# Patient Record
Sex: Male | Born: 1940 | Race: White | Hispanic: No | Marital: Married
Health system: Southern US, Community
[De-identification: ages and names within clinical notes are randomized; demographics above are authoritative.]

## PROBLEM LIST (undated history)

## (undated) DIAGNOSIS — I219 Acute myocardial infarction, unspecified: Secondary | ICD-10-CM

## (undated) DIAGNOSIS — I480 Paroxysmal atrial fibrillation: Secondary | ICD-10-CM

## (undated) DIAGNOSIS — T8859XA Other complications of anesthesia, initial encounter: Secondary | ICD-10-CM

## (undated) DIAGNOSIS — K219 Gastro-esophageal reflux disease without esophagitis: Secondary | ICD-10-CM

## (undated) DIAGNOSIS — K567 Ileus, unspecified: Secondary | ICD-10-CM

## (undated) DIAGNOSIS — I714 Abdominal aortic aneurysm, without rupture, unspecified: Secondary | ICD-10-CM

## (undated) DIAGNOSIS — D696 Thrombocytopenia, unspecified: Secondary | ICD-10-CM

## (undated) DIAGNOSIS — G709 Myoneural disorder, unspecified: Secondary | ICD-10-CM

## (undated) DIAGNOSIS — G4733 Obstructive sleep apnea (adult) (pediatric): Secondary | ICD-10-CM

## (undated) DIAGNOSIS — C801 Malignant (primary) neoplasm, unspecified: Secondary | ICD-10-CM

## (undated) DIAGNOSIS — G0490 Encephalitis and encephalomyelitis, unspecified: Secondary | ICD-10-CM

## (undated) DIAGNOSIS — I10 Essential (primary) hypertension: Secondary | ICD-10-CM

## (undated) DIAGNOSIS — K432 Incisional hernia without obstruction or gangrene: Secondary | ICD-10-CM

## (undated) DIAGNOSIS — J189 Pneumonia, unspecified organism: Secondary | ICD-10-CM

## (undated) DIAGNOSIS — I255 Ischemic cardiomyopathy: Secondary | ICD-10-CM

## (undated) DIAGNOSIS — E7800 Pure hypercholesterolemia, unspecified: Secondary | ICD-10-CM

## (undated) DIAGNOSIS — T4145XA Adverse effect of unspecified anesthetic, initial encounter: Secondary | ICD-10-CM

## (undated) DIAGNOSIS — R52 Pain, unspecified: Secondary | ICD-10-CM

## (undated) DIAGNOSIS — M1990 Unspecified osteoarthritis, unspecified site: Secondary | ICD-10-CM

## (undated) DIAGNOSIS — Z9989 Dependence on other enabling machines and devices: Secondary | ICD-10-CM

## (undated) DIAGNOSIS — E119 Type 2 diabetes mellitus without complications: Secondary | ICD-10-CM

## (undated) DIAGNOSIS — M21372 Foot drop, left foot: Secondary | ICD-10-CM

## (undated) DIAGNOSIS — M21371 Foot drop, right foot: Secondary | ICD-10-CM

## (undated) DIAGNOSIS — G629 Polyneuropathy, unspecified: Secondary | ICD-10-CM

## (undated) DIAGNOSIS — L853 Xerosis cutis: Secondary | ICD-10-CM

## (undated) DIAGNOSIS — I2510 Atherosclerotic heart disease of native coronary artery without angina pectoris: Secondary | ICD-10-CM

## (undated) DIAGNOSIS — U071 COVID-19: Secondary | ICD-10-CM

## (undated) DIAGNOSIS — I5042 Chronic combined systolic (congestive) and diastolic (congestive) heart failure: Secondary | ICD-10-CM

## (undated) DIAGNOSIS — M722 Plantar fascial fibromatosis: Secondary | ICD-10-CM

## (undated) DIAGNOSIS — F32A Depression, unspecified: Secondary | ICD-10-CM

## (undated) HISTORY — PX: CORONARY ANGIOPLASTY WITH STENT PLACEMENT: SHX49

## (undated) HISTORY — PX: COLONOSCOPY: SHX174

## (undated) HISTORY — PX: ABDOMINAL AORTIC ANEURYSM REPAIR: SUR1152

## (undated) HISTORY — PX: BACK SURGERY: SHX140

## (undated) HISTORY — PX: CARDIAC CATHETERIZATION: SHX172

## (undated) HISTORY — PX: LAPAROSCOPIC CHOLECYSTECTOMY: SUR755

---

## 1998-04-28 ENCOUNTER — Inpatient Hospital Stay (HOSPITAL_COMMUNITY): Admission: EM | Admit: 1998-04-28 | Discharge: 1998-05-04 | Payer: Self-pay | Admitting: Emergency Medicine

## 1998-05-23 ENCOUNTER — Encounter (HOSPITAL_COMMUNITY): Admission: RE | Admit: 1998-05-23 | Discharge: 1998-08-21 | Payer: Self-pay | Admitting: Interventional Cardiology

## 1998-08-07 ENCOUNTER — Encounter: Payer: Self-pay | Admitting: Interventional Cardiology

## 1998-08-07 ENCOUNTER — Inpatient Hospital Stay (HOSPITAL_COMMUNITY): Admission: EM | Admit: 1998-08-07 | Discharge: 1998-08-08 | Payer: Self-pay | Admitting: Interventional Cardiology

## 1998-08-21 ENCOUNTER — Encounter (HOSPITAL_COMMUNITY): Admission: RE | Admit: 1998-08-21 | Discharge: 1998-11-19 | Payer: Self-pay | Admitting: Interventional Cardiology

## 1999-08-19 ENCOUNTER — Ambulatory Visit: Admission: RE | Admit: 1999-08-19 | Discharge: 1999-08-19 | Payer: Self-pay | Admitting: Internal Medicine

## 1999-11-24 ENCOUNTER — Ambulatory Visit: Admission: RE | Admit: 1999-11-24 | Discharge: 1999-11-24 | Payer: Self-pay | Admitting: Internal Medicine

## 2000-11-03 ENCOUNTER — Encounter: Admission: RE | Admit: 2000-11-03 | Discharge: 2001-02-01 | Payer: Self-pay | Admitting: Internal Medicine

## 2001-03-07 ENCOUNTER — Encounter: Payer: Self-pay | Admitting: *Deleted

## 2001-03-07 ENCOUNTER — Ambulatory Visit (HOSPITAL_COMMUNITY): Admission: RE | Admit: 2001-03-07 | Discharge: 2001-03-07 | Payer: Self-pay | Admitting: *Deleted

## 2001-03-10 ENCOUNTER — Encounter: Admission: RE | Admit: 2001-03-10 | Discharge: 2001-03-10 | Payer: Self-pay | Admitting: *Deleted

## 2001-03-10 ENCOUNTER — Encounter: Payer: Self-pay | Admitting: *Deleted

## 2001-03-27 ENCOUNTER — Encounter: Admission: RE | Admit: 2001-03-27 | Discharge: 2001-03-27 | Payer: Self-pay | Admitting: *Deleted

## 2001-04-10 ENCOUNTER — Encounter: Payer: Self-pay | Admitting: *Deleted

## 2001-04-10 ENCOUNTER — Encounter: Admission: RE | Admit: 2001-04-10 | Discharge: 2001-04-10 | Payer: Self-pay | Admitting: *Deleted

## 2001-06-10 ENCOUNTER — Encounter: Admission: RE | Admit: 2001-06-10 | Discharge: 2001-06-10 | Payer: Self-pay | Admitting: Internal Medicine

## 2001-06-10 ENCOUNTER — Encounter: Payer: Self-pay | Admitting: Internal Medicine

## 2001-10-13 ENCOUNTER — Encounter: Admission: RE | Admit: 2001-10-13 | Discharge: 2001-10-13 | Payer: Self-pay | Admitting: Internal Medicine

## 2001-10-13 ENCOUNTER — Encounter: Payer: Self-pay | Admitting: Internal Medicine

## 2002-02-01 ENCOUNTER — Ambulatory Visit (HOSPITAL_COMMUNITY): Admission: RE | Admit: 2002-02-01 | Discharge: 2002-02-01 | Payer: Self-pay | Admitting: Neurological Surgery

## 2002-02-01 ENCOUNTER — Encounter: Payer: Self-pay | Admitting: Neurological Surgery

## 2002-02-18 ENCOUNTER — Encounter: Payer: Self-pay | Admitting: Neurological Surgery

## 2002-02-18 ENCOUNTER — Ambulatory Visit (HOSPITAL_COMMUNITY): Admission: RE | Admit: 2002-02-18 | Discharge: 2002-02-19 | Payer: Self-pay | Admitting: Neurological Surgery

## 2002-04-13 ENCOUNTER — Encounter: Payer: Self-pay | Admitting: Internal Medicine

## 2002-04-13 ENCOUNTER — Encounter: Admission: RE | Admit: 2002-04-13 | Discharge: 2002-04-13 | Payer: Self-pay | Admitting: Internal Medicine

## 2002-08-25 ENCOUNTER — Encounter: Payer: Self-pay | Admitting: Neurological Surgery

## 2002-08-25 ENCOUNTER — Encounter: Admission: RE | Admit: 2002-08-25 | Discharge: 2002-08-25 | Payer: Self-pay | Admitting: Neurological Surgery

## 2002-09-08 ENCOUNTER — Encounter: Payer: Self-pay | Admitting: Neurological Surgery

## 2002-09-08 ENCOUNTER — Ambulatory Visit (HOSPITAL_COMMUNITY): Admission: RE | Admit: 2002-09-08 | Discharge: 2002-09-08 | Payer: Self-pay | Admitting: Neurological Surgery

## 2002-10-07 ENCOUNTER — Encounter: Payer: Self-pay | Admitting: Internal Medicine

## 2002-10-07 ENCOUNTER — Encounter: Admission: RE | Admit: 2002-10-07 | Discharge: 2002-10-07 | Payer: Self-pay | Admitting: Internal Medicine

## 2003-01-13 ENCOUNTER — Encounter: Admission: RE | Admit: 2003-01-13 | Discharge: 2003-01-13 | Payer: Self-pay | Admitting: Neurological Surgery

## 2003-01-13 ENCOUNTER — Encounter: Payer: Self-pay | Admitting: Neurological Surgery

## 2004-05-13 IMAGING — CT CT CHEST W/O CM
1 of 2 series · 14 of 32 positions shown, 18 images · non-contrast
Comparison: none

FINDINGS
CLINICAL DATA: F/U LEFT BASILAR NODULE.
CT CHEST W/O CONTRAST
THE SMALL NODULES IN THE LEFT LOWER LOBE MEASURING 7 X 10MM IN SIZE IS UNCHANGED IN SIZE AND
CONFIGURATION WHEN COMPARED WITH THE PRIOR STUDY DATED 04/13/02.  THERE IS ALSO A SMALL (4 X 6MM)
STABLE NODULE WITHIN THE RIGHT LOWER LOBE (IMAGE #15).  THERE ARE NO OTHER NODULES AND THE
REMAINDER OF THE STUDY IS UNCHANGED AS WELL.
IMPRESSION
STABLE BILATERAL LOWER LOBE NODULES AS DISCUSSED ABOVE.  THIS WOULD BE MOST CONSISTENT WITH BENIGN
ETIOLOGY (GRANULOMATA).

[Series 2: — · axial · 0.74mm/px · z∈[-312,-232]mm · 14 of 38 slices shown, 18 images]
[im 3/38  mediastinal]
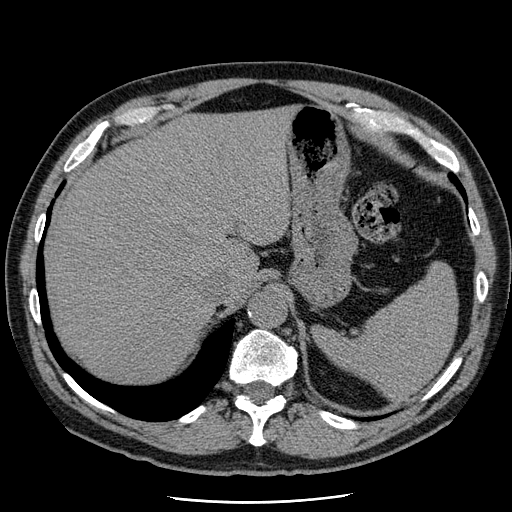
[im 3/38  lung]
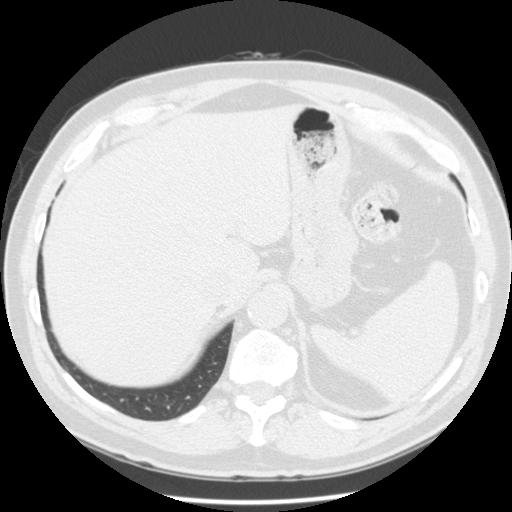
[im 6/38  lung]
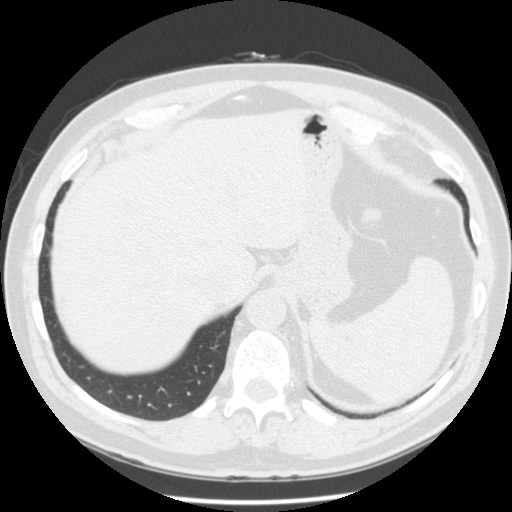
[im 9/38  lung]
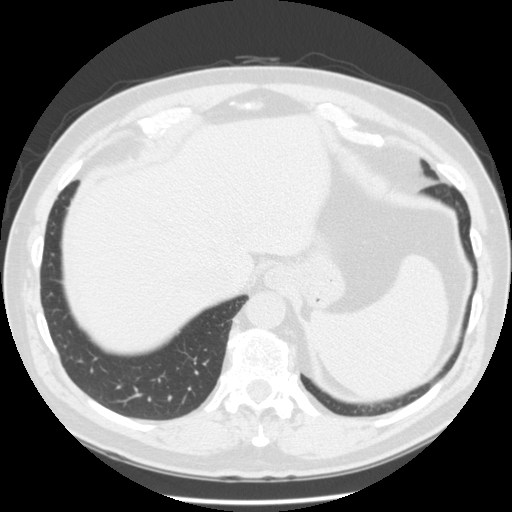
[im 12/38  lung]
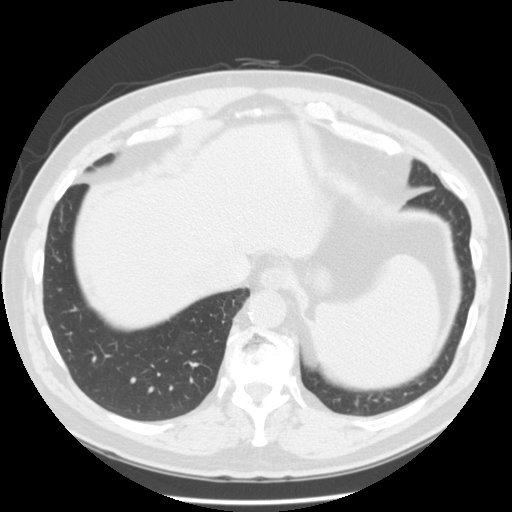
[im 15/38  mediastinal]
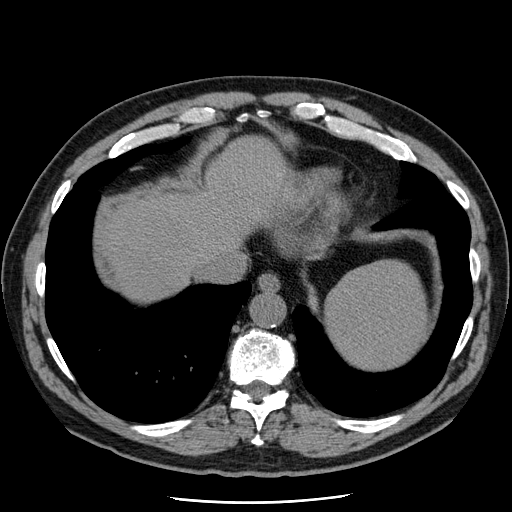
[im 15/38  lung]
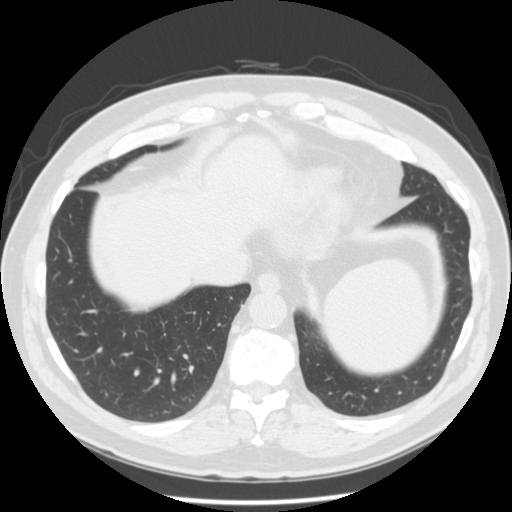
[im 16/38  lung]
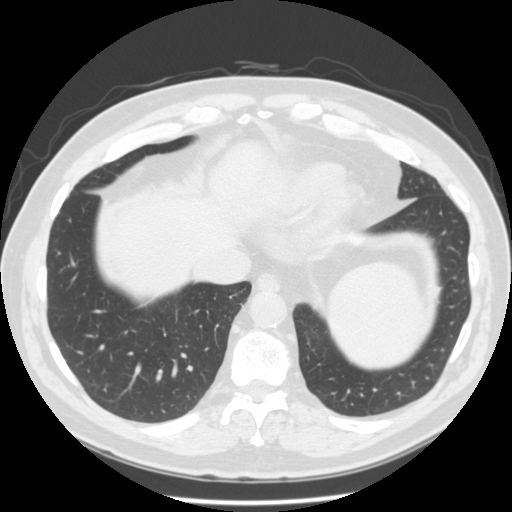
[im 18/38  lung]
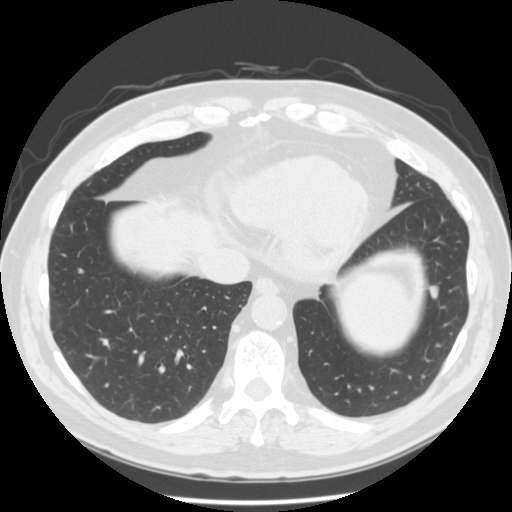
[im 19/38  lung]
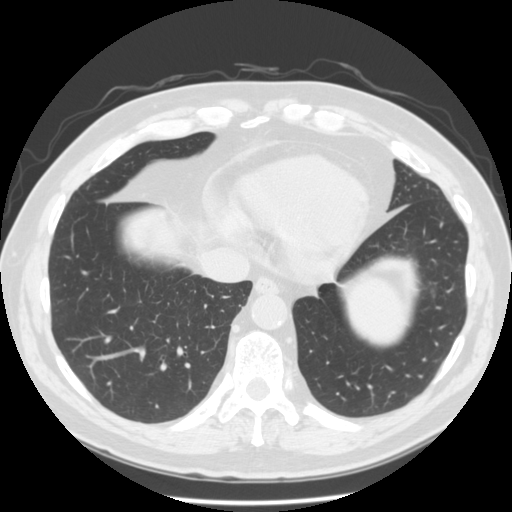
[im 20/38  mediastinal]
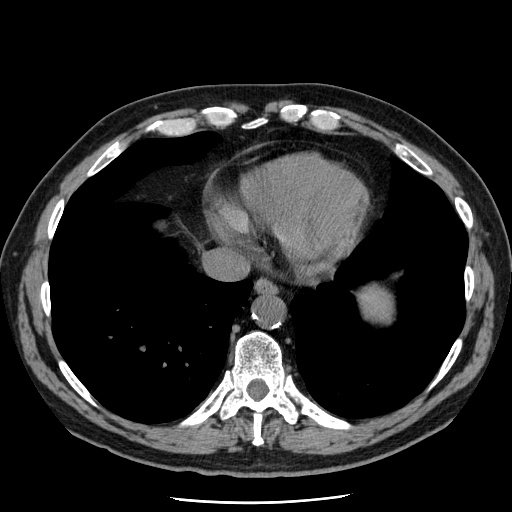
[im 20/38  lung]
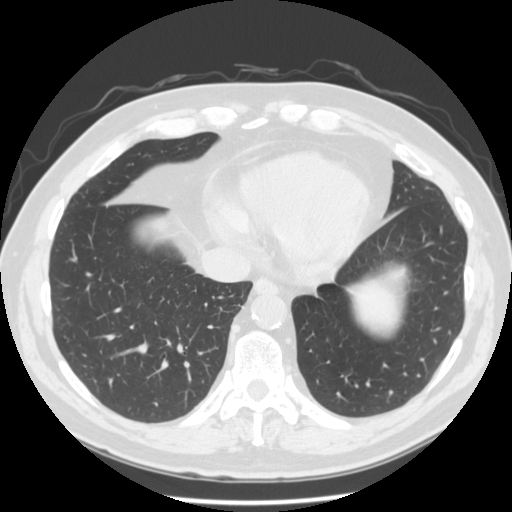
[im 23/38  lung]
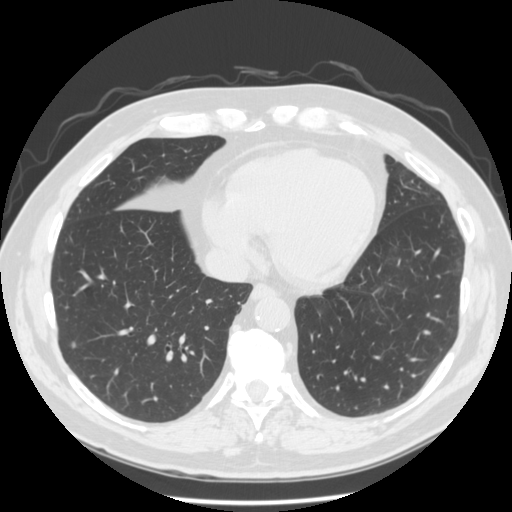
[im 26/38  lung]
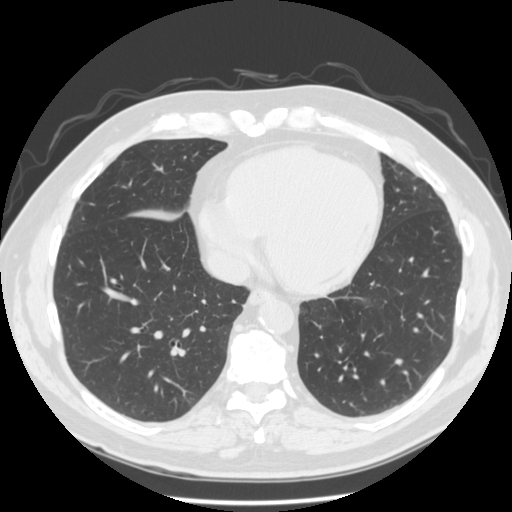
[im 29/38  lung]
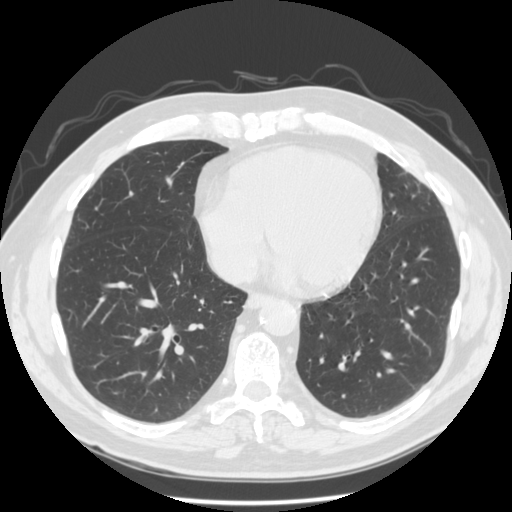
[im 32/38  mediastinal]
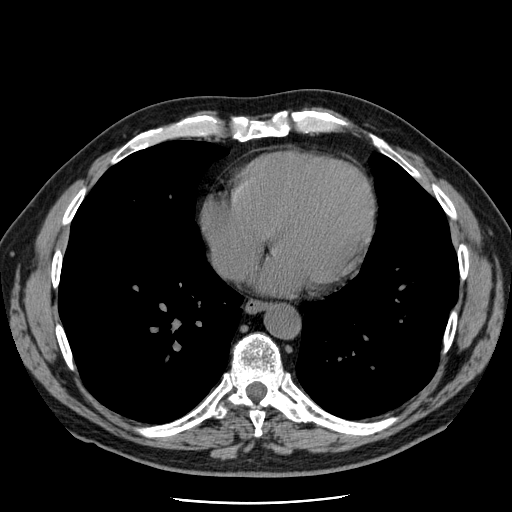
[im 32/38  lung]
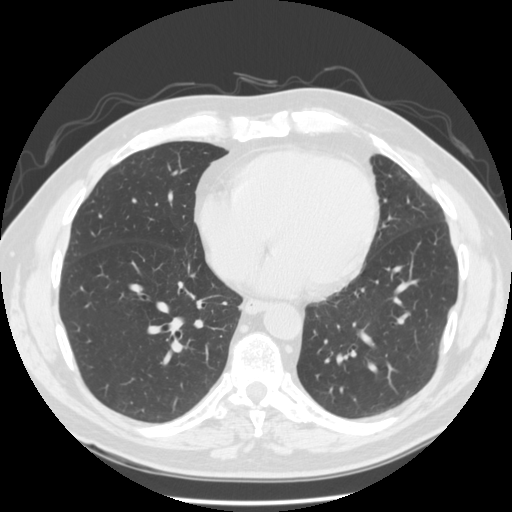
[im 35/38  lung]
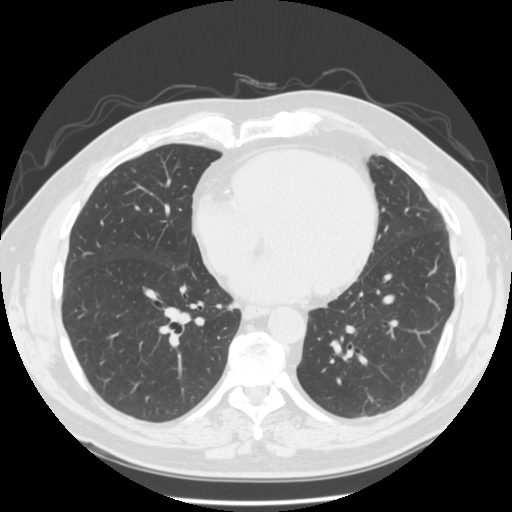

[14 of 32 positions shown; findings below may reference images not displayed]

## 2007-12-03 ENCOUNTER — Inpatient Hospital Stay (HOSPITAL_COMMUNITY): Admission: EM | Admit: 2007-12-03 | Discharge: 2007-12-06 | Payer: Self-pay | Admitting: Emergency Medicine

## 2007-12-12 ENCOUNTER — Inpatient Hospital Stay (HOSPITAL_COMMUNITY): Admission: EM | Admit: 2007-12-12 | Discharge: 2007-12-13 | Payer: Self-pay | Admitting: Emergency Medicine

## 2007-12-17 ENCOUNTER — Ambulatory Visit (HOSPITAL_COMMUNITY): Admission: RE | Admit: 2007-12-17 | Discharge: 2007-12-17 | Payer: Self-pay | Admitting: Interventional Cardiology

## 2007-12-23 ENCOUNTER — Ambulatory Visit: Payer: Self-pay | Admitting: Vascular Surgery

## 2008-03-31 ENCOUNTER — Inpatient Hospital Stay (HOSPITAL_COMMUNITY): Admission: RE | Admit: 2008-03-31 | Discharge: 2008-04-19 | Payer: Self-pay | Admitting: Vascular Surgery

## 2008-03-31 ENCOUNTER — Encounter: Payer: Self-pay | Admitting: Vascular Surgery

## 2008-03-31 ENCOUNTER — Ambulatory Visit: Payer: Self-pay | Admitting: Vascular Surgery

## 2008-04-04 ENCOUNTER — Encounter (INDEPENDENT_AMBULATORY_CARE_PROVIDER_SITE_OTHER): Payer: Self-pay | Admitting: Cardiology

## 2008-04-24 ENCOUNTER — Ambulatory Visit: Payer: Self-pay | Admitting: Internal Medicine

## 2008-04-24 ENCOUNTER — Inpatient Hospital Stay (HOSPITAL_COMMUNITY): Admission: EM | Admit: 2008-04-24 | Discharge: 2008-04-25 | Payer: Self-pay | Admitting: Emergency Medicine

## 2008-05-13 ENCOUNTER — Ambulatory Visit: Payer: Self-pay | Admitting: Vascular Surgery

## 2008-07-22 ENCOUNTER — Ambulatory Visit: Payer: Self-pay | Admitting: Vascular Surgery

## 2009-01-04 ENCOUNTER — Ambulatory Visit (HOSPITAL_COMMUNITY): Admission: RE | Admit: 2009-01-04 | Discharge: 2009-01-04 | Payer: Self-pay | Admitting: Neurological Surgery

## 2009-01-20 ENCOUNTER — Encounter: Admission: RE | Admit: 2009-01-20 | Discharge: 2009-01-20 | Payer: Self-pay | Admitting: Internal Medicine

## 2009-07-09 IMAGING — CR DG ABDOMEN ACUTE W/ 1V CHEST
3 series · 3 of 3 positions shown · non-contrast
Comparison: none

CLINICAL DATA: 66-year-old with hypertension.  Previous smoker. 
ACUTE ABDOMINAL SERIES ? 3 VIEW:

[w chest pa]
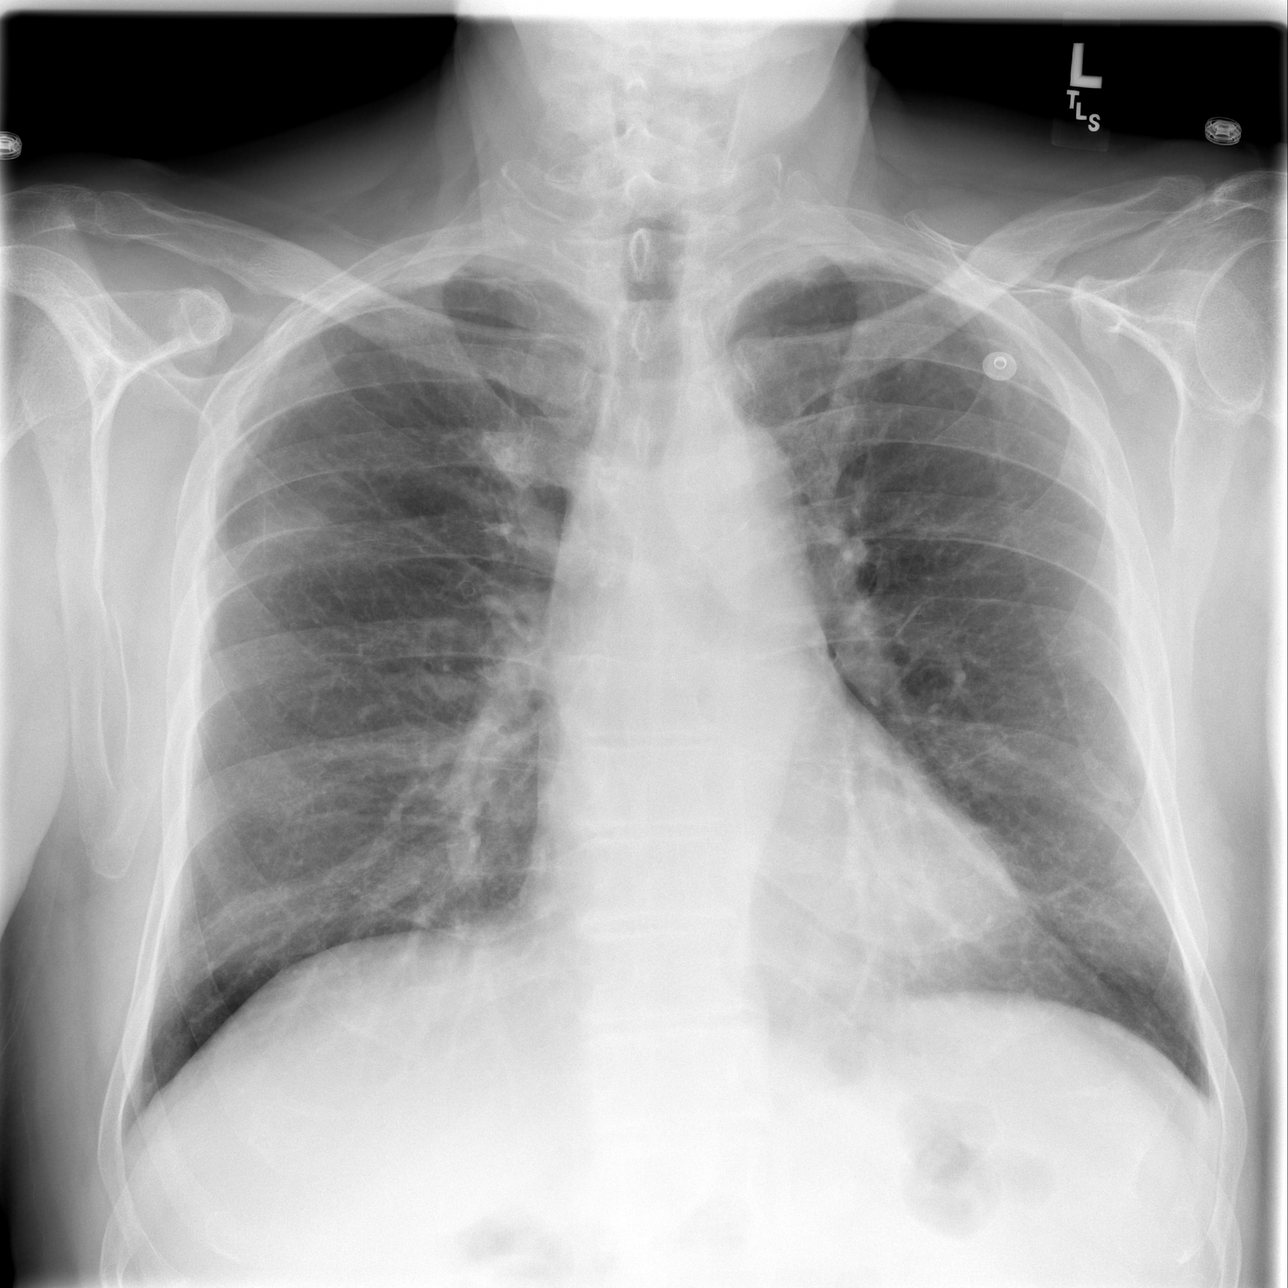

[w abdomen upright]
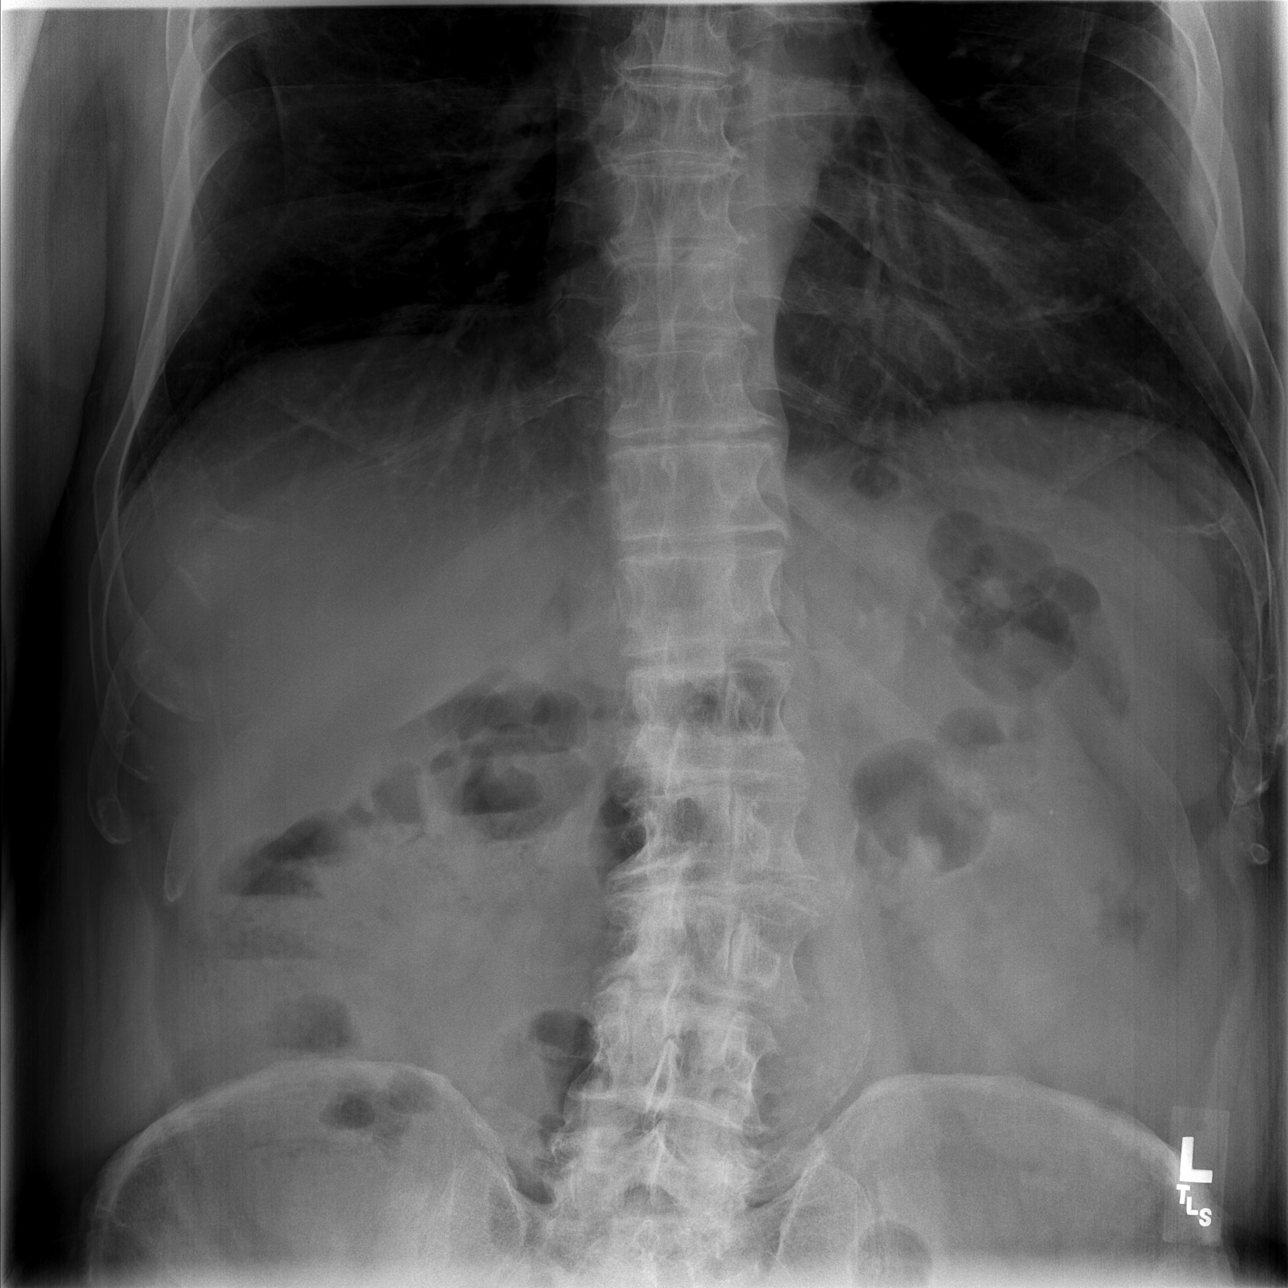

[t abdomen supine]
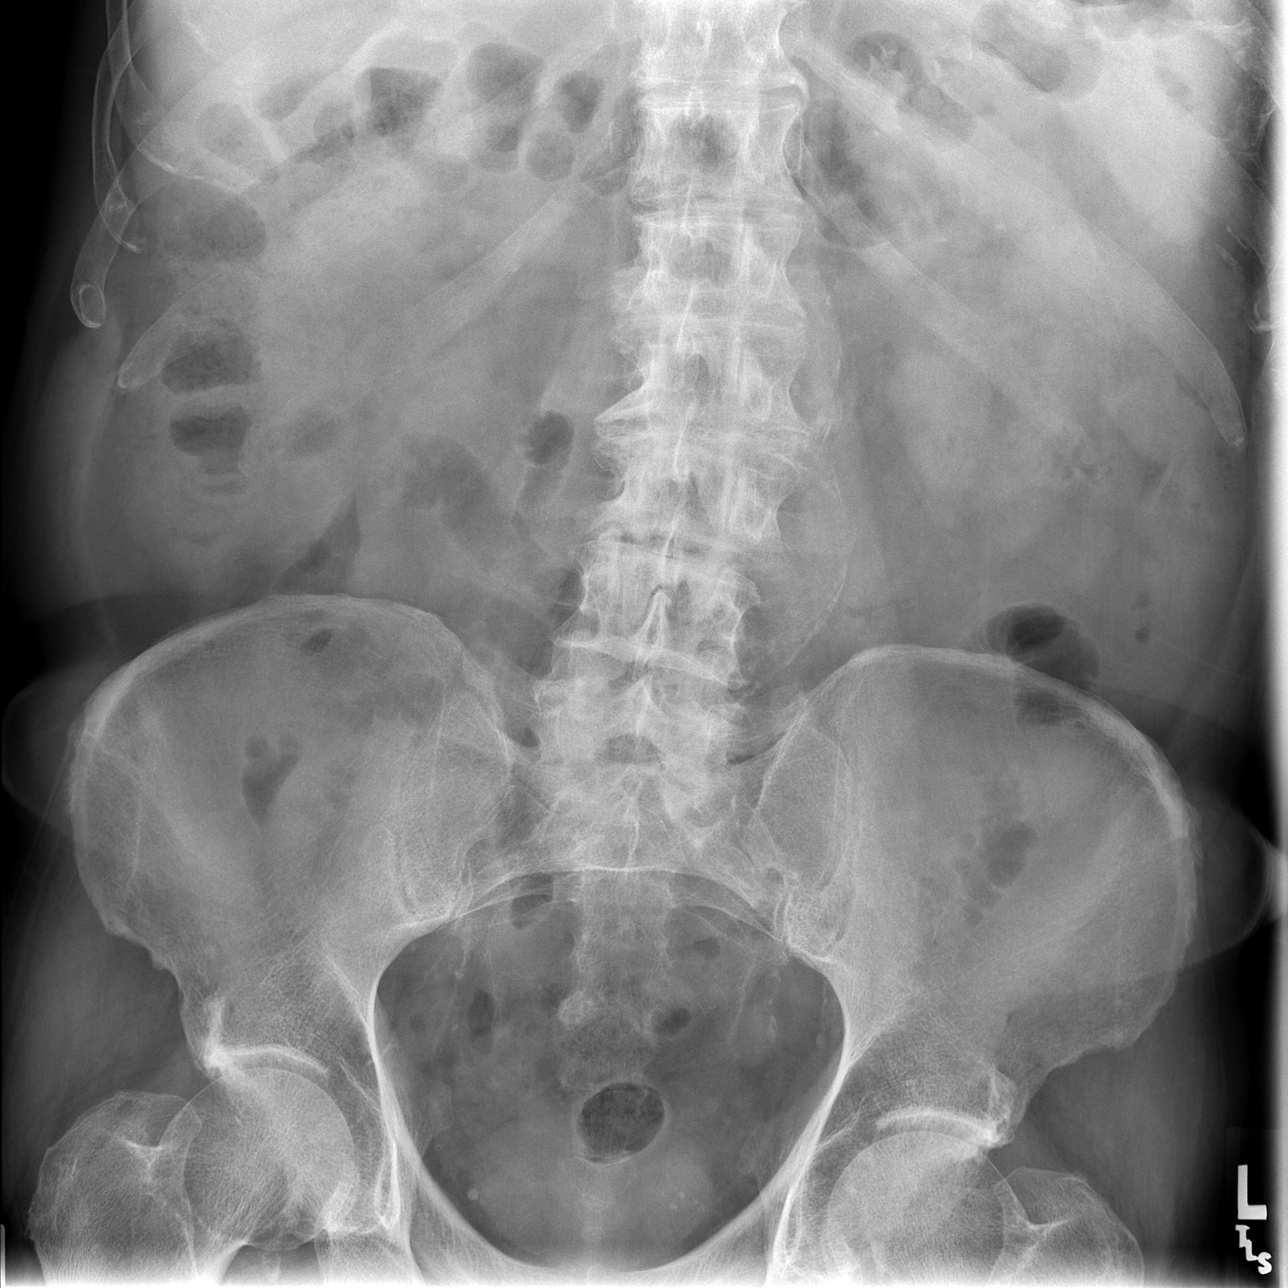

[3 of 3 positions shown; findings below may reference images not displayed]

FINDINGS: The chest radiograph demonstrates a vague opacity in the right upper lobe.  Findings concerning for an underlying parenchymal lesion.  Remainder of the lungs are clear with some scarring in the apices.  The heart and mediastinum are within normal limits.  No evidence for free air.  Degenerative endplate changes in the lumbar spine.  Tortuosity of the abdominal aorta which is heavily calcified.  Suspect there is an underlying aortic aneurysm.  Bowel gas pattern is nonspecific.  The abdominal aorta is difficult to visualize due to the overlying bone but the aorta may measure up to 4.6 cm.
IMPRESSION: 1.  Suspicious opacification in the right upper lung.  Worrisome for a primary neoplasm.  Recommend chest CT. 
2.  Calcifications of the abdominal aorta and suspect underlying aneurysm.  
3.  Nonspecific bowel gas pattern.

## 2009-07-10 IMAGING — CT CT ABDOMEN W/ CM
2 of 7 series · 13 of 46 positions shown, 18 images · IV contrast (APPLIED)
Comparison: none

CLINICAL DATA: Nausea and vomiting. Hypotension. Abnormal chest x-ray with an ill-defined density in the right upper lobe seen on the study of 12/03/07. Abdominal aortic aneurysm demonstrated on abdominal radiograph of 12/03/07.
 CHEST CT WITH CONTRAST:
TECHNIQUE: Multidetector CT imaging of the chest was performed following the standard protocol during bolus administration of intravenous contrast.
 Contrast: 100 cc Omnipaque 300.
TECHNIQUE: Multidetector CT imaging of the abdomen was performed following the standard protocol during bolus administration of intravenous contrast. 
TECHNIQUE: Multidetector CT imaging of the pelvis was performed following the standard protocol during bolus administration of intravenous contrast.

[Series 3: abd/pel 5.0 b31f · axial · 0.80mm/px · z∈[-513,-58]mm · 10 of 106 slices shown, 15 images]
[im 8/106  soft-tissue]
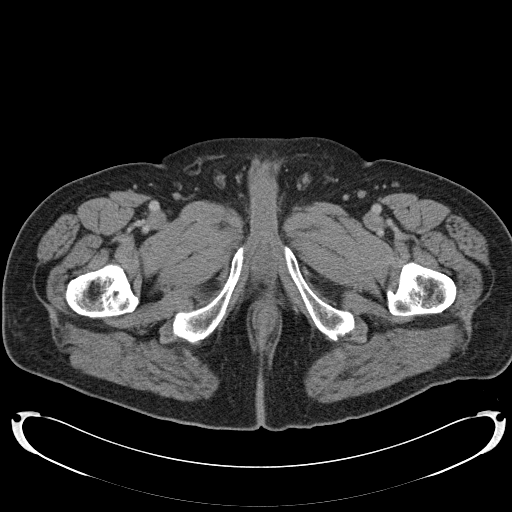
[im 8/106  bone]
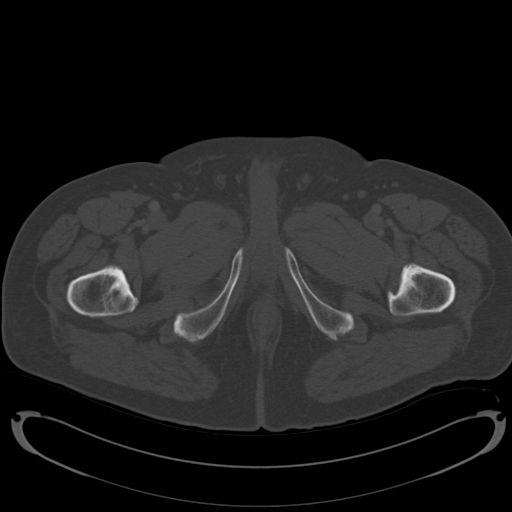
[im 22/106  soft-tissue]
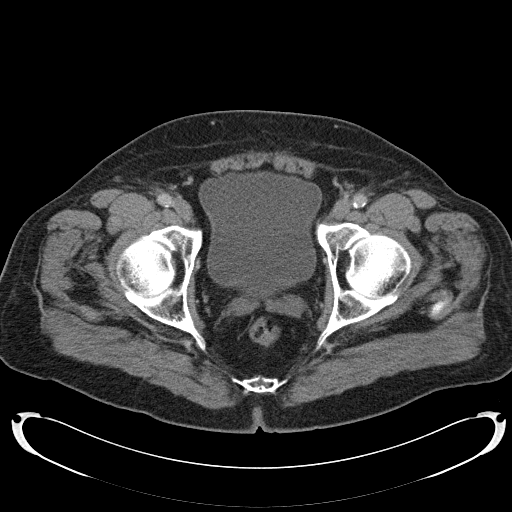
[im 29/106  soft-tissue]
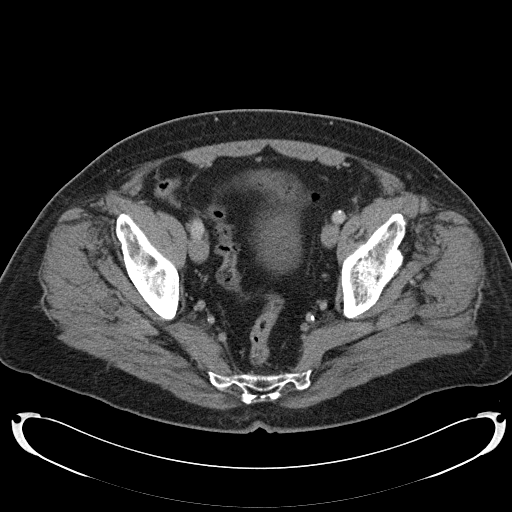
[im 43/106  soft-tissue]
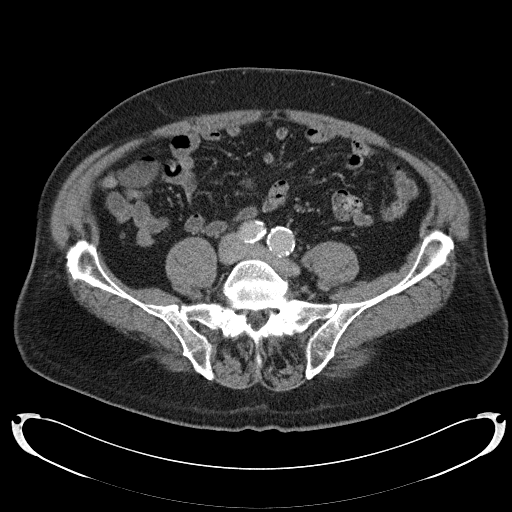
[im 57/106  soft-tissue]
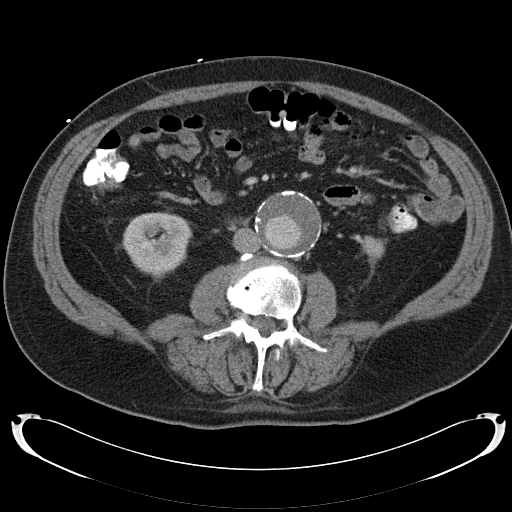
[im 64/106  soft-tissue]
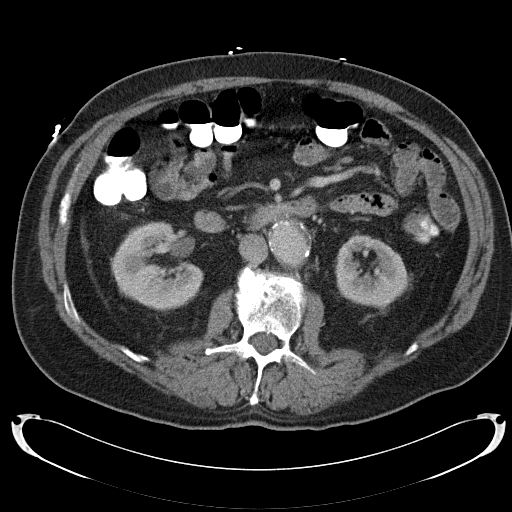
[im 78/106  soft-tissue]
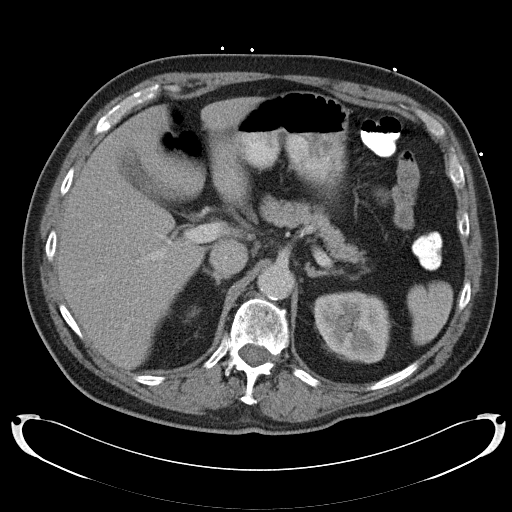
[im 78/106  lung]
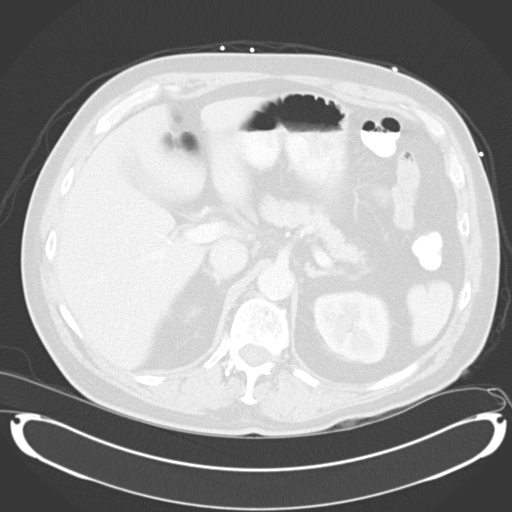
[im 85/106  soft-tissue]
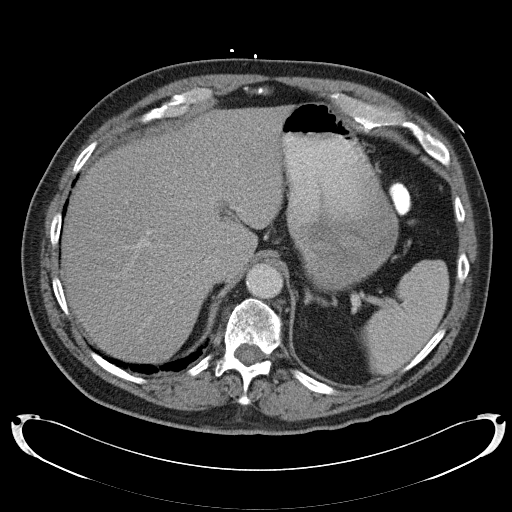
[im 85/106  lung]
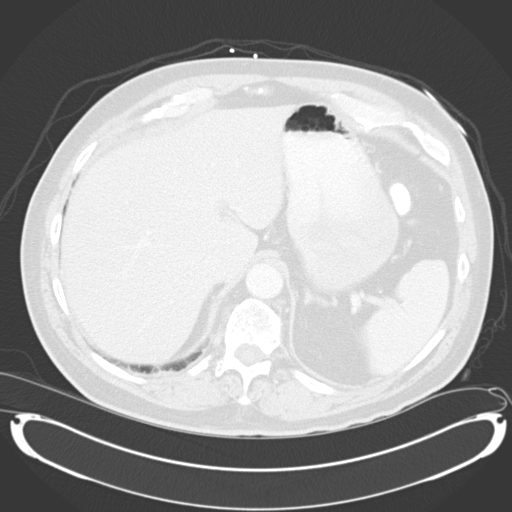
[im 92/106  lung]
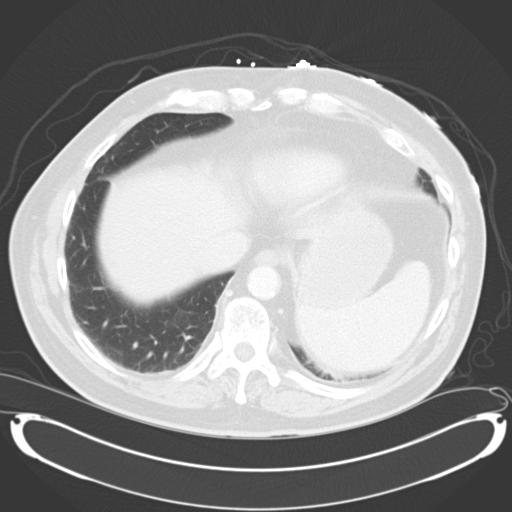
[im 99/106  soft-tissue]
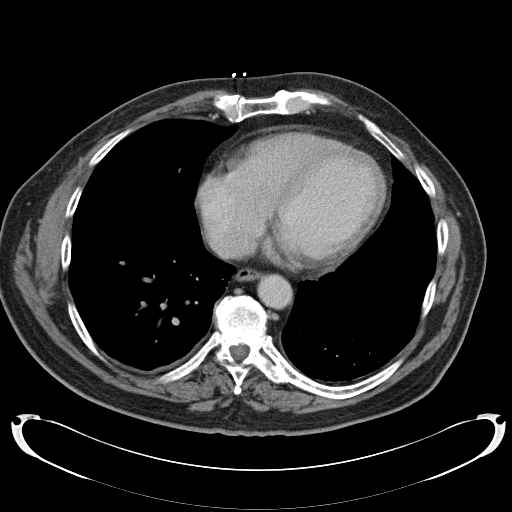
[im 99/106  lung]
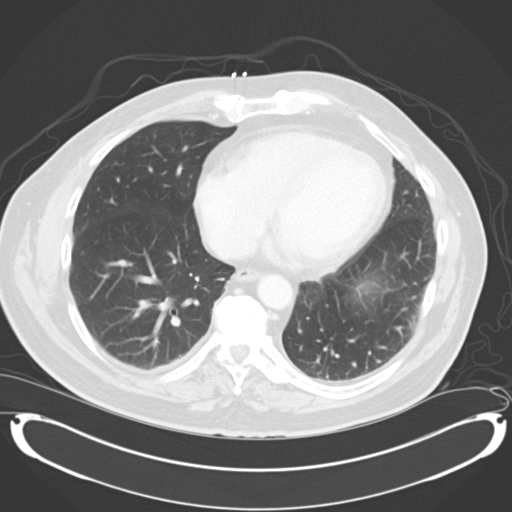
[im 99/106  bone]
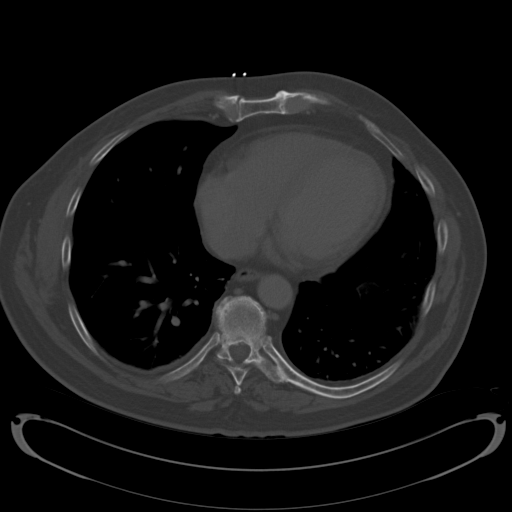

[Series 10: abd/pel 2.0 spo cor · coronal · 1.03mm/px · 3 of 146 slices shown]
[im 37/146  soft-tissue]
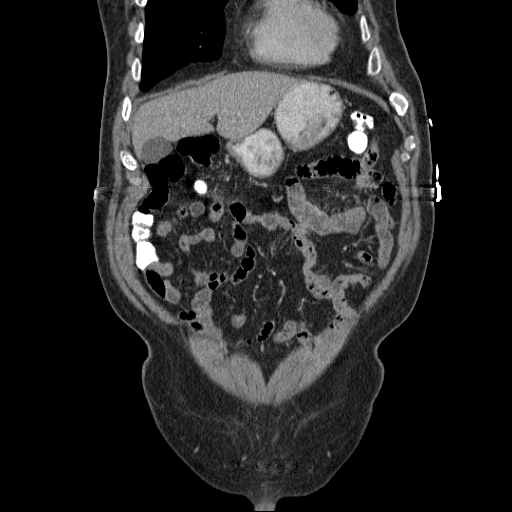
[im 73/146  soft-tissue]
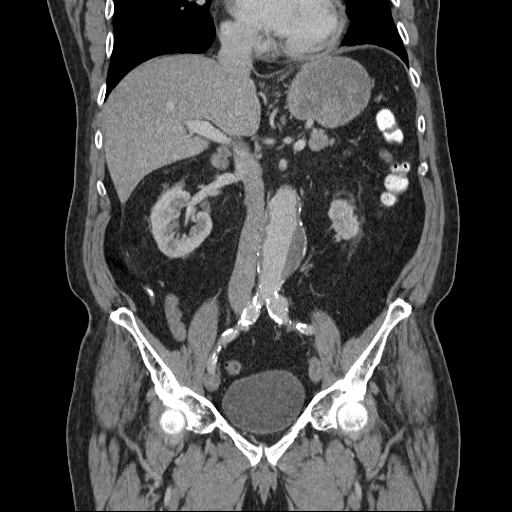
[im 109/146  soft-tissue]
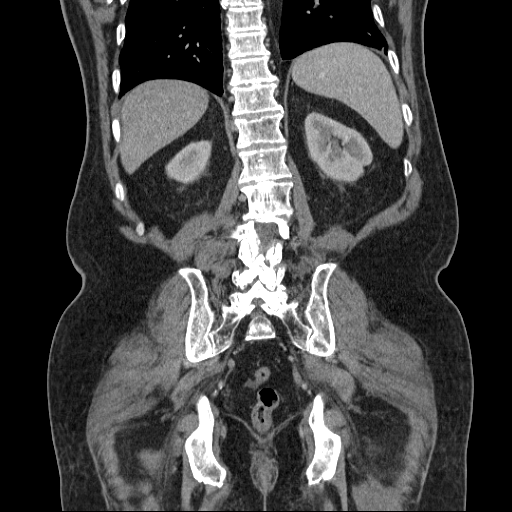

[13 of 46 positions shown; findings below may reference images not displayed]

FINDINGS: The scan demonstrates that the ill-defined density in the right upper lung zone represents fat along the superolateral aspect of the major fissure. The patient has a 19 x 12 mm partially calcified nodule on the pleural surface posteromedially at approximately T5 level, which was described on the prior CT scan dated 06/10/01 and is felt to represent a benign abnormality. There is a 10 mm nodule in the left lower lobe just above the diaphragm laterally and this was also described on a prior chest CT dated 06/10/01 and by description is felt to be unchanged.  The images are not available for comparison. The lungs are otherwise clear. No hilar or mediastinal adenopathy. The patient has fairly extensive coronary calcification. The heart size is normal.
IMPRESSION: No significant abnormality. The density on chest x-ray represents fat in the major fissure. Nodule at the left lung base is stable since 8668.  Nodule posteriorly on the right adjacent to the right fifth rib is also stable by description. No followup is recommended. 
 ABDOMEN CT WITH CONTRAST:
FINDINGS: The patient has as fusiform abdominal aortic aneurysm with a maximum transverse diameter of 5.4 cm.  The aneurysm is approximately 9.7 cm long. There is slight aneurysmal dilatation of the common iliac arteries bilaterally with the right measuring 18 mm in maximum diameter and the left measuring 23 mm maximum diameter. There is no evidence of leakage. There is circumferential thrombus in the aneurysm. 
 The liver, spleen, pancreas, adrenal glands, and kidneys demonstrate no significant abnormalities. 
 There is diffuse degenerative disc disease with fairly severe spinal stenosis at L3-4.
IMPRESSION: Fusiform abdominal aortic aneurysm with a maximum diameter of 5.4 cm. Smaller aneurysmal dilatations of the common iliac arteries.
 PELVIS CT WITH CONTRAST:
FINDINGS: There is extensive calcification in the external iliac arteries and in the common iliac and hypogastric arteries bilaterally.  Again noted are the aneurysms of the distal abdominal aorta and the common iliac arteries.  No significant bony abnormalities of the pelvis. There is no diverticular disease or adenopathy.
IMPRESSION: No acute abnormality of the pelvis. Aneurysms of the abdominal aorta and common iliac arteries.

## 2009-07-18 IMAGING — CR DG CHEST 1V PORT
1 series · 1 of 1 positions shown · non-contrast
Comparison: 12/03/07.

CLINICAL DATA: Chest pain. 
 PORTABLE CHEST - 1 VIEW - 12/12/07:

[AP]
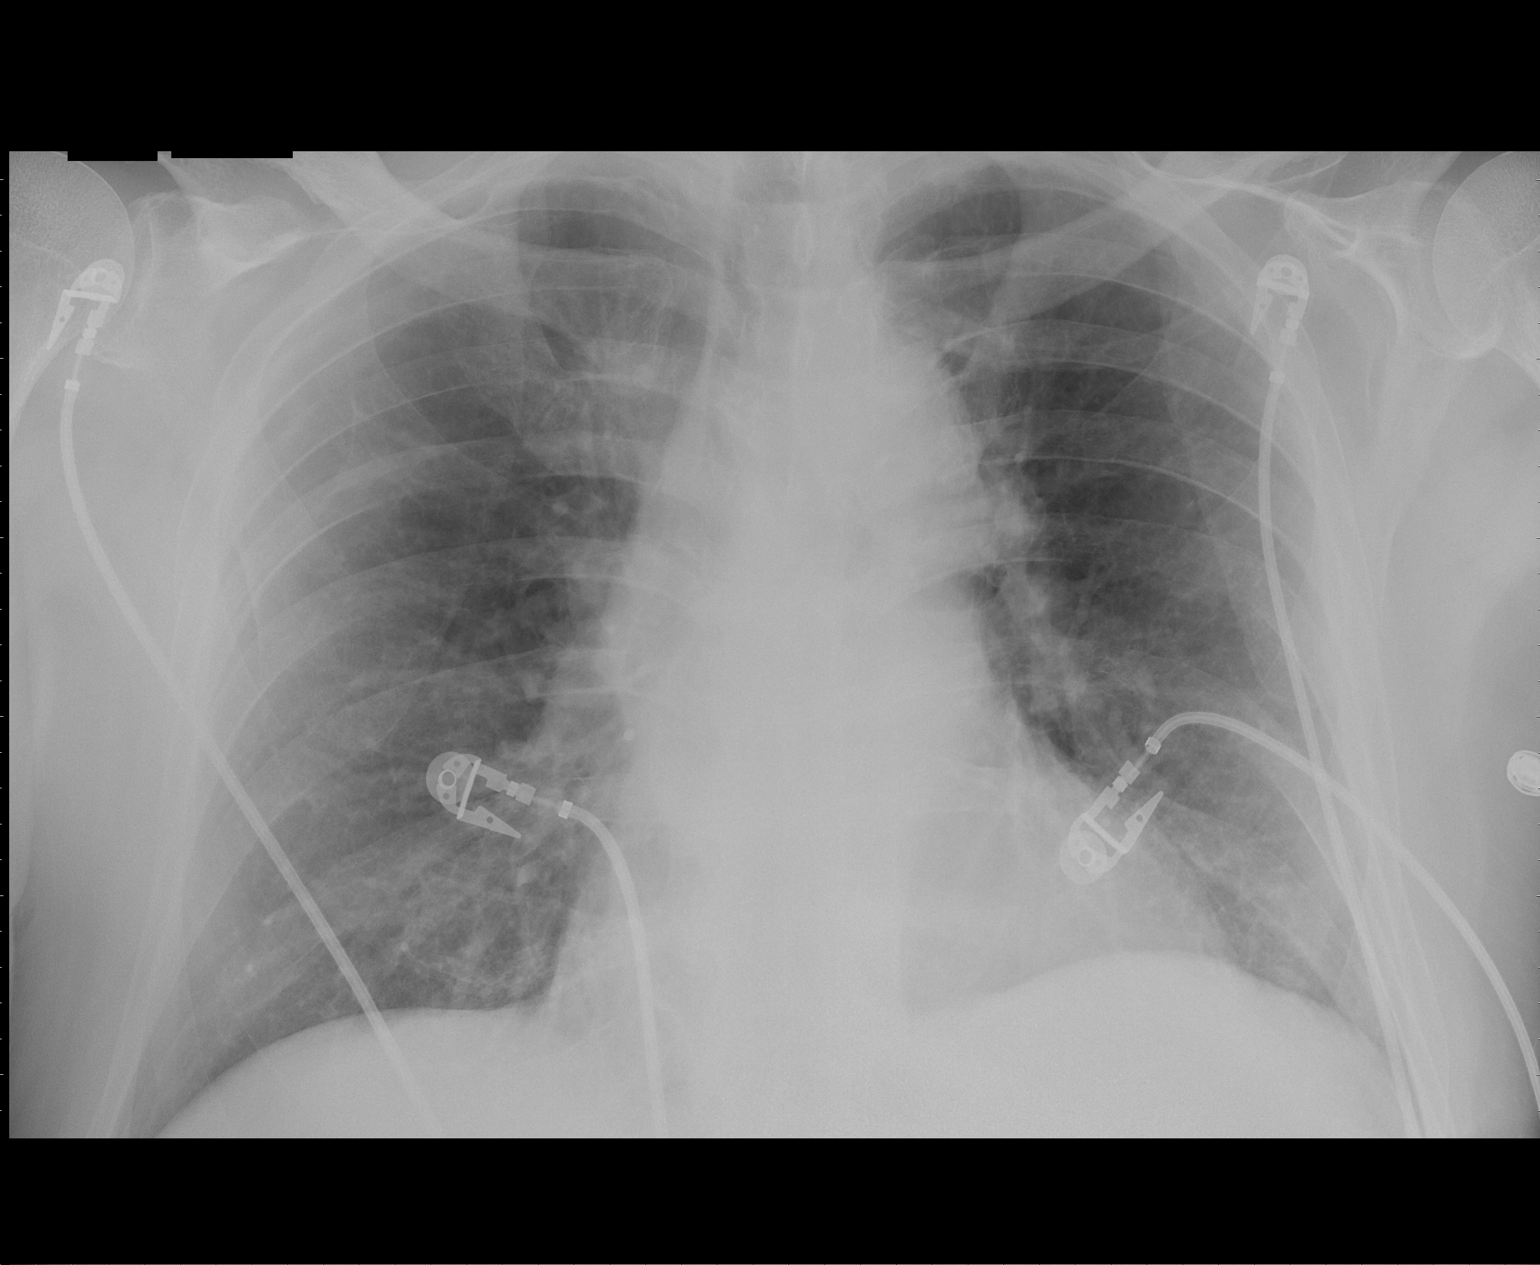

[1 of 1 positions shown; findings below may reference images not displayed]

FINDINGS: Right upper lobe opacities are again identified previously worked up with CT.  Cardiomegaly and mild peribronchial thickening are stable.  Mild tortuosity of the thoracic aorta is again identified. There is no evidence of focal air space disease, pleural effusions, or pneumothorax.
IMPRESSION: No evidence of acute cardiopulmonary disease.

## 2009-09-17 ENCOUNTER — Inpatient Hospital Stay (HOSPITAL_COMMUNITY): Admission: EM | Admit: 2009-09-17 | Discharge: 2009-09-19 | Payer: Self-pay | Admitting: Emergency Medicine

## 2009-10-18 ENCOUNTER — Encounter: Admission: RE | Admit: 2009-10-18 | Discharge: 2009-10-18 | Payer: Self-pay | Admitting: Internal Medicine

## 2009-10-19 ENCOUNTER — Encounter: Admission: RE | Admit: 2009-10-19 | Discharge: 2009-10-19 | Payer: Self-pay | Admitting: Internal Medicine

## 2009-11-03 IMAGING — CR DG CHEST 2V
2 series · 2 of 2 positions shown · non-contrast
Comparison: 12/12/2007, 12/03/2007, and CT scan of the chest
12/04/2007

CLINICAL DATA: Pre admit for AAA repair

CHEST - 2 VIEW

[view not recorded (1 of 2)]
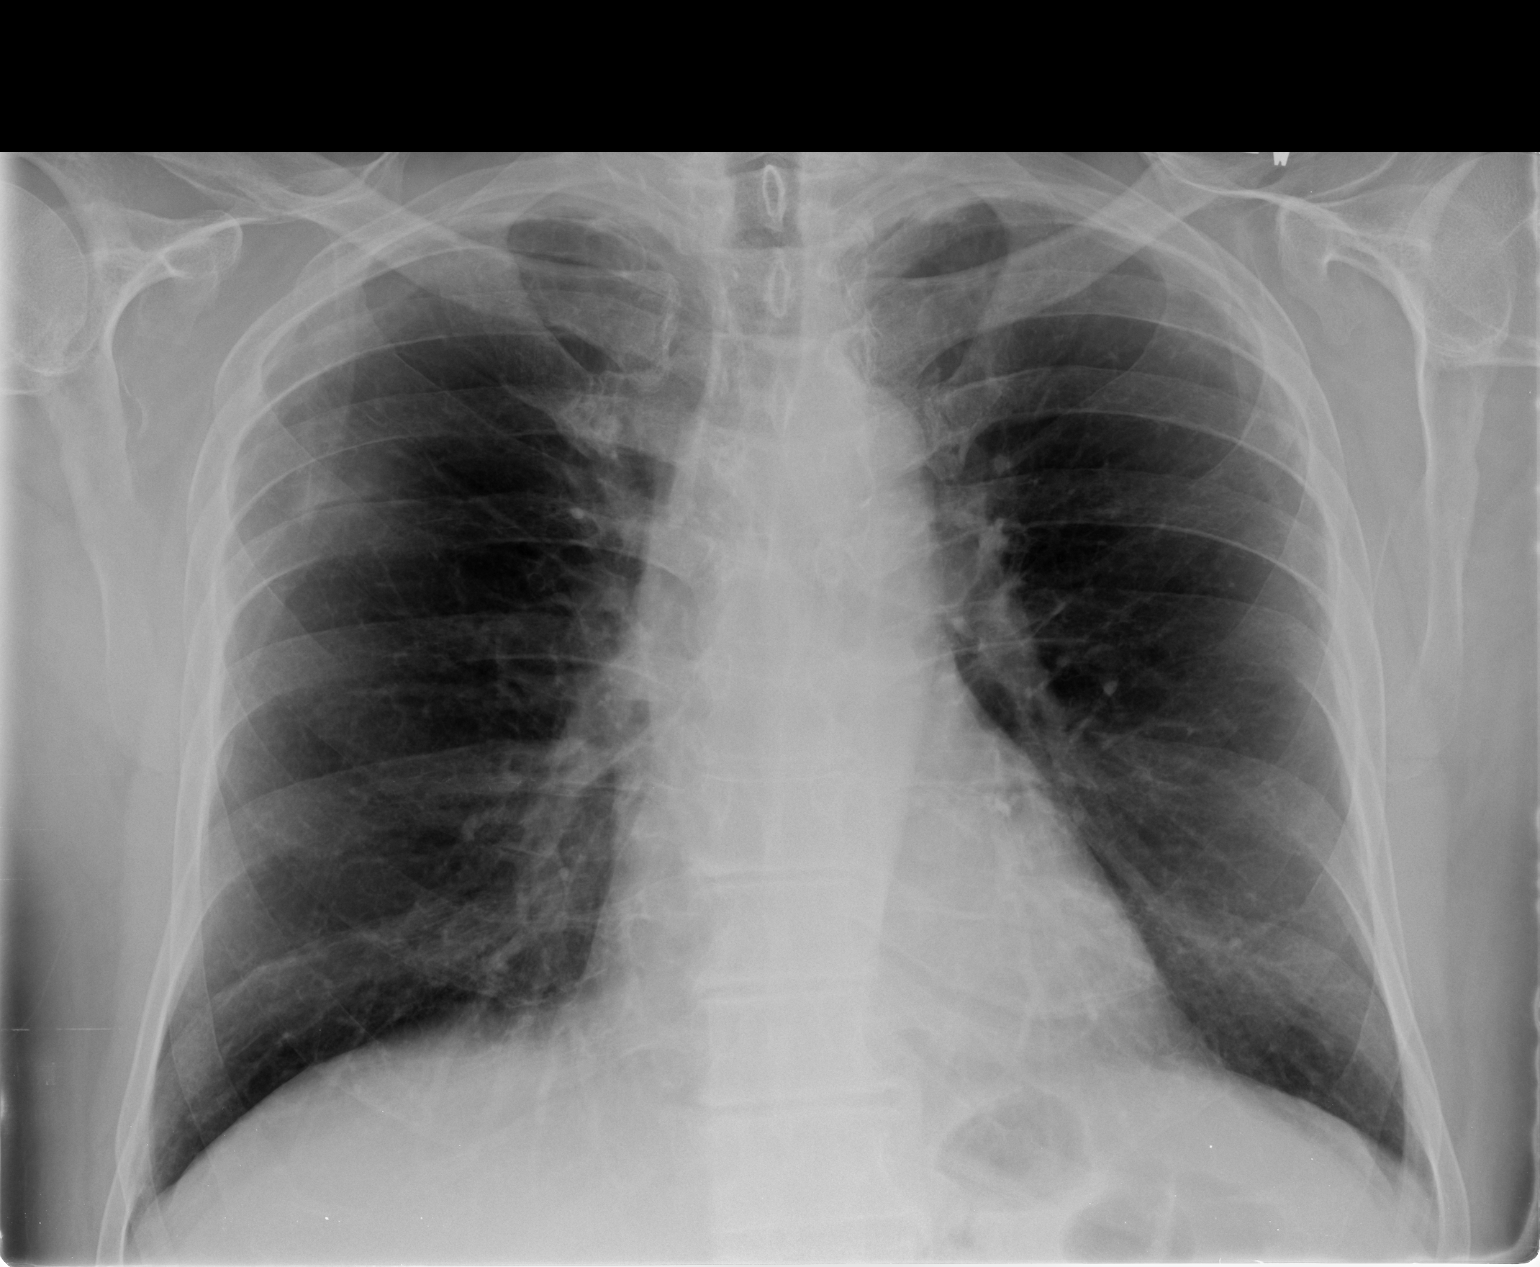

[view not recorded (2 of 2)]
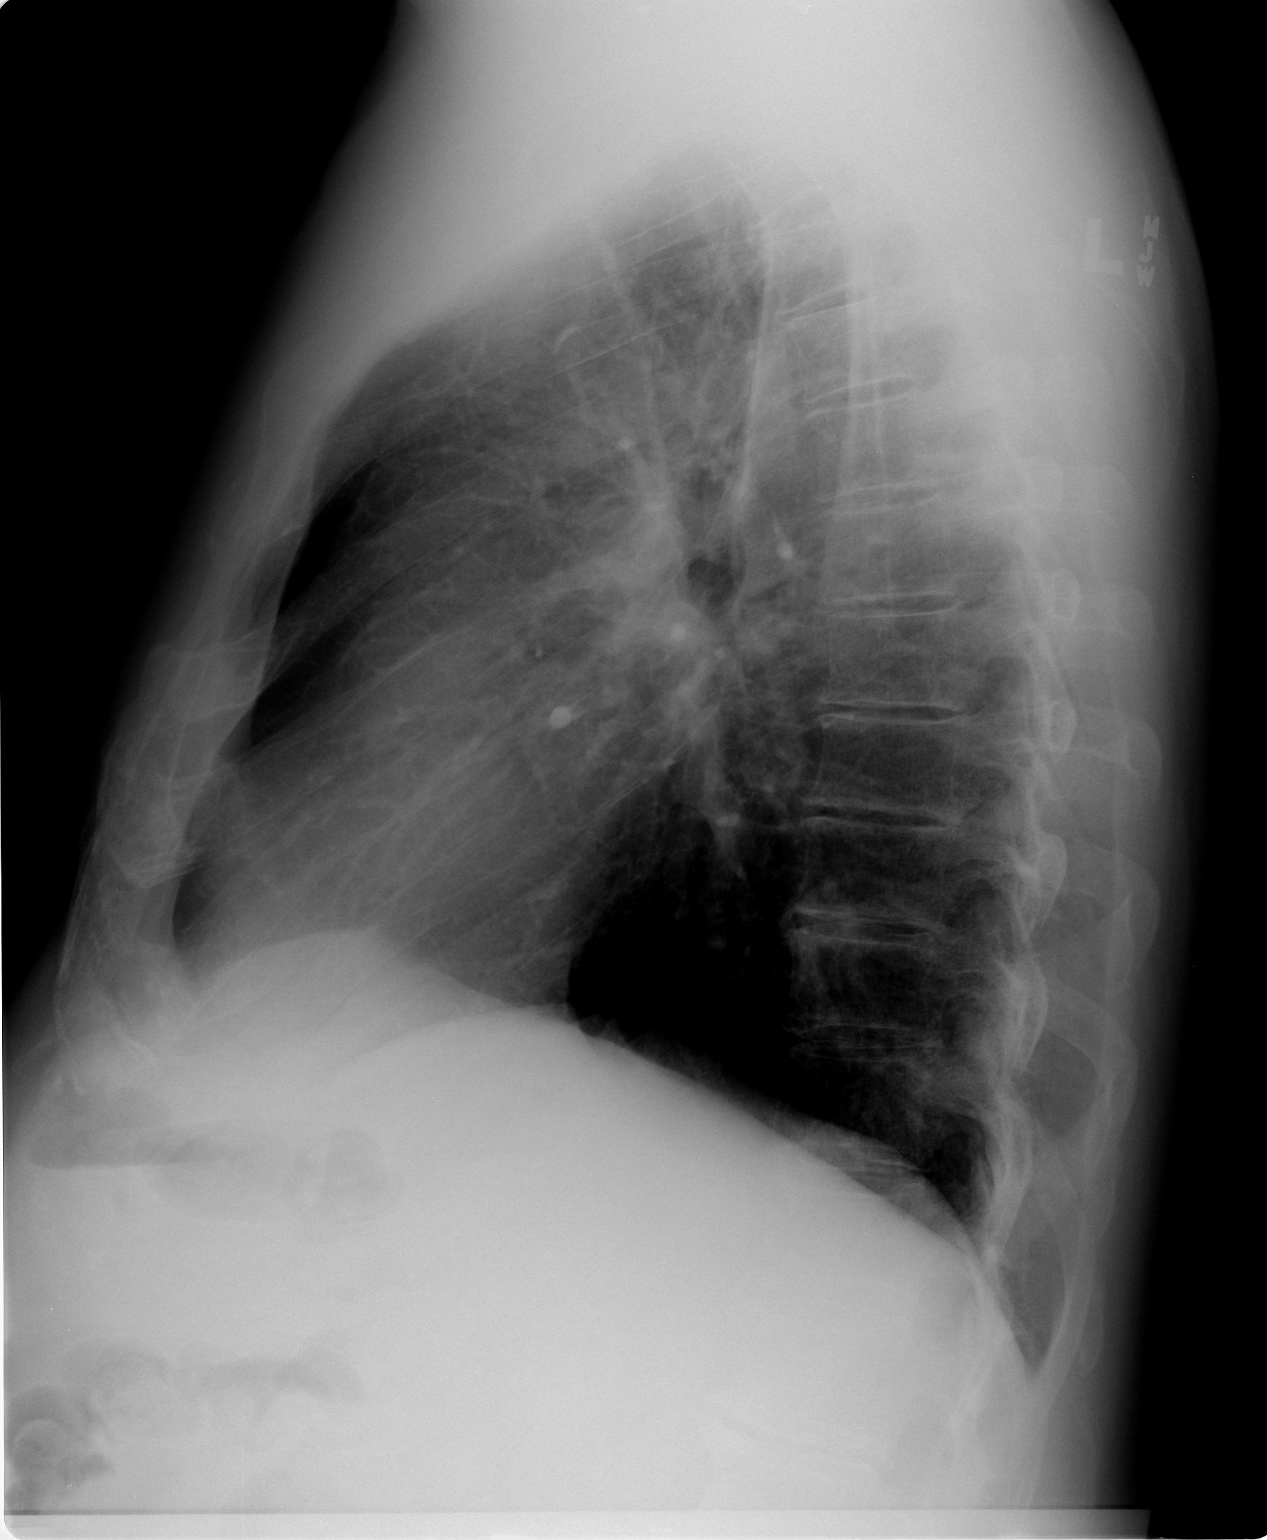

[2 of 2 positions shown; findings below may reference images not displayed]

FINDINGS: Stable ill-defined opacity in the periphery of the right
upper lobe which has been worked up on prior CT, and has been shown
to represent fat along the major fissure.  There is no acute
process or interval change.  Heart mediastinal contours normal the
lungs are somewhat hyperaerated consistent with at least some
degree of COPD..
IMPRESSION: 1.  Stable ill-defined density projecting over the right upper
lobe.
2.  Probable COPD.
3.  No active process or interval change.

## 2009-11-05 IMAGING — CR DG CHEST 1V PORT
1 series · 1 of 1 positions shown · non-contrast
Comparison: 03/29/2008

CLINICAL DATA: Postop abdominal aortic aneurysm repair.

PORTABLE CHEST - 1 VIEW

[view not recorded]
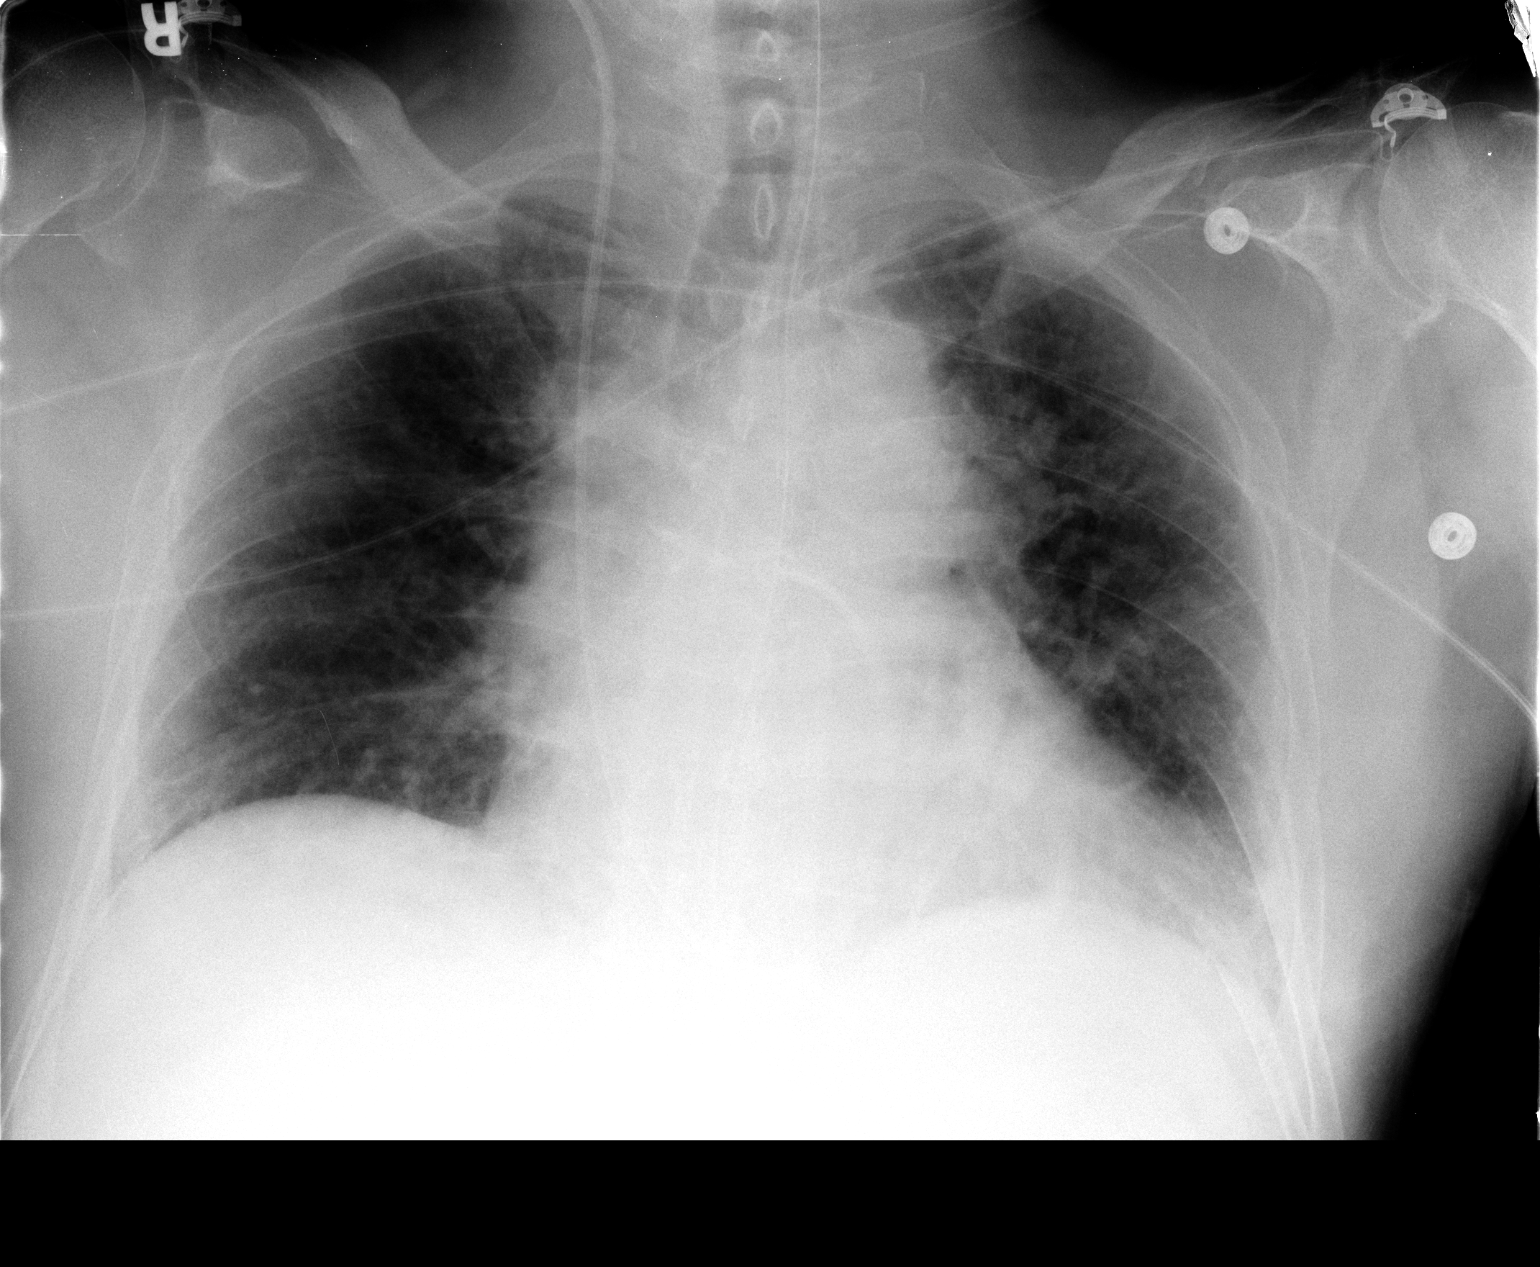

[1 of 1 positions shown; findings below may reference images not displayed]

FINDINGS: The NG tube is coursing down the esophagus into the
stomach.  There is a right IJ Swan-Ganz catheter with its tip in
the right main pulmonary artery.  No endotracheal tube is
visualized.  Low lung volumes with vascular crowding and
atelectasis.  There is perihilar vascular congestion without overt
pulmonary edema.  No pneumothorax is seen.
IMPRESSION: 1.  Low volume chest film with vascular crowding and atelectasis.
There is also mild central vascular congestion.
2.  NG tube and Swan-Ganz catheter in good position without
complicating features.
3.  No pneumothorax.

## 2009-11-06 IMAGING — CR DG CHEST 1V PORT
1 series · 1 of 1 positions shown · non-contrast
Comparison: 03/31/2008 and earlier.

CLINICAL DATA: 67-year-old male with abdominal aortic aneurysm.

PORTABLE CHEST - 1 VIEW

[view not recorded]
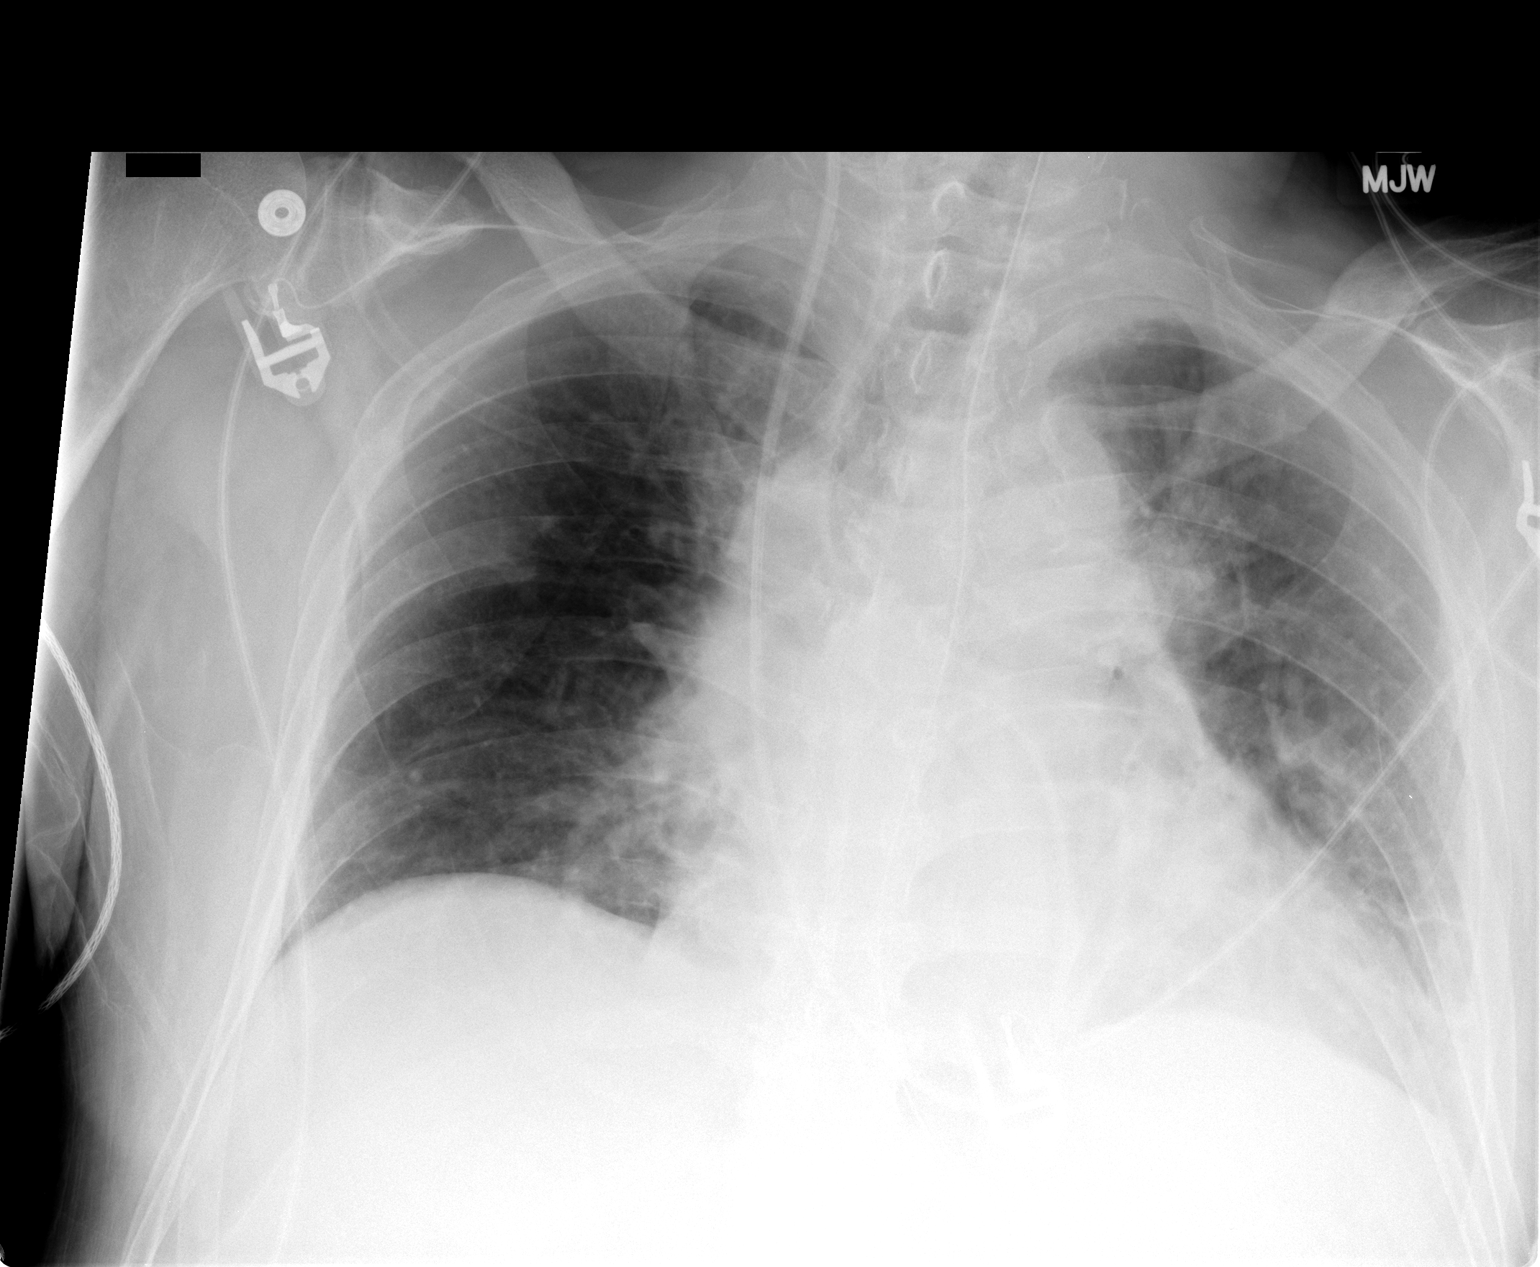

[1 of 1 positions shown; findings below may reference images not displayed]

FINDINGS: Portable AP semi erect view at 8979 hours.  No
endotracheal tube identified.  Enteric tube courses to the left
upper quadrant, tip not included.  There is a right internal
jugular approach Swan-Ganz catheter, the tip is at the level of the
main pulmonary outflow tract.  Stable shallow lung volumes with
retrocardiac atelectasis.  No pneumothorax, pulmonary edema,
pleural effusion or consolidation.
IMPRESSION: 1. Stable lines and tubes.
2.  Stable shallow lung volumes and basilar atelectasis.

## 2009-11-15 IMAGING — CR DG ABDOMEN 1V
1 series · 1 of 1 positions shown · non-contrast
Comparison: Earlier today at 7878 hours

CLINICAL DATA: Tube placement.

ABDOMEN - 1 VIEW

[AP]
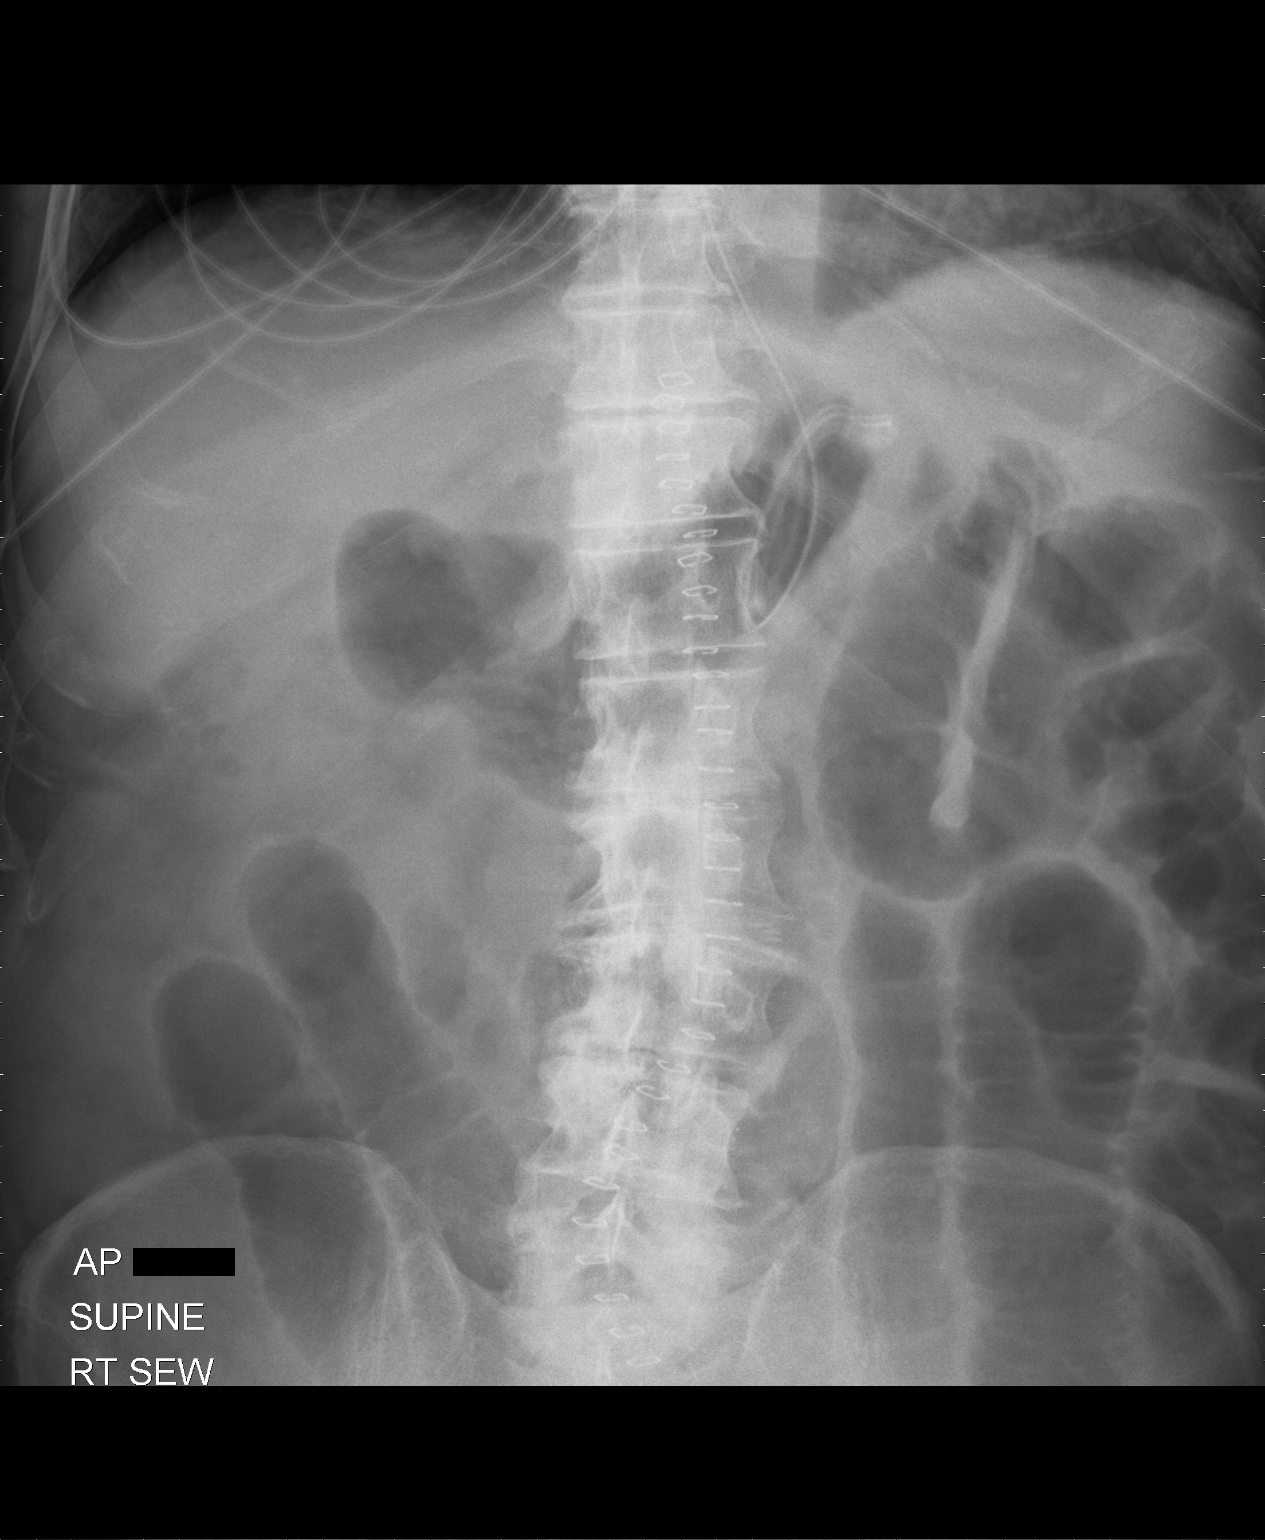

[1 of 1 positions shown; findings below may reference images not displayed]

FINDINGS: Interval placement of a nasogastric tube.  This
terminates at the proximal stomach.  Mildly motion degraded exam.
Persistent moderate diffuse small bowel distension.  No pneumatosis
or free intraperitoneal air.  Portions of the left abdomen and
entire pelvis excluded.
IMPRESSION: 1.  Nasogastric terminates at the body of stomach.
2.  Similar small bowel distension suspicious for distal small
bowel obstruction.

## 2009-11-16 IMAGING — CR DG ABDOMEN 2V
2 series · 2 of 2 positions shown · non-contrast
Comparison: 04/10/2008 and earlier.

CLINICAL DATA: 67-year-old male with abdominal aortic aneurysm
status post surgery postoperative day 11.

ABDOMEN - 2 VIEW

[w abdomen upright]
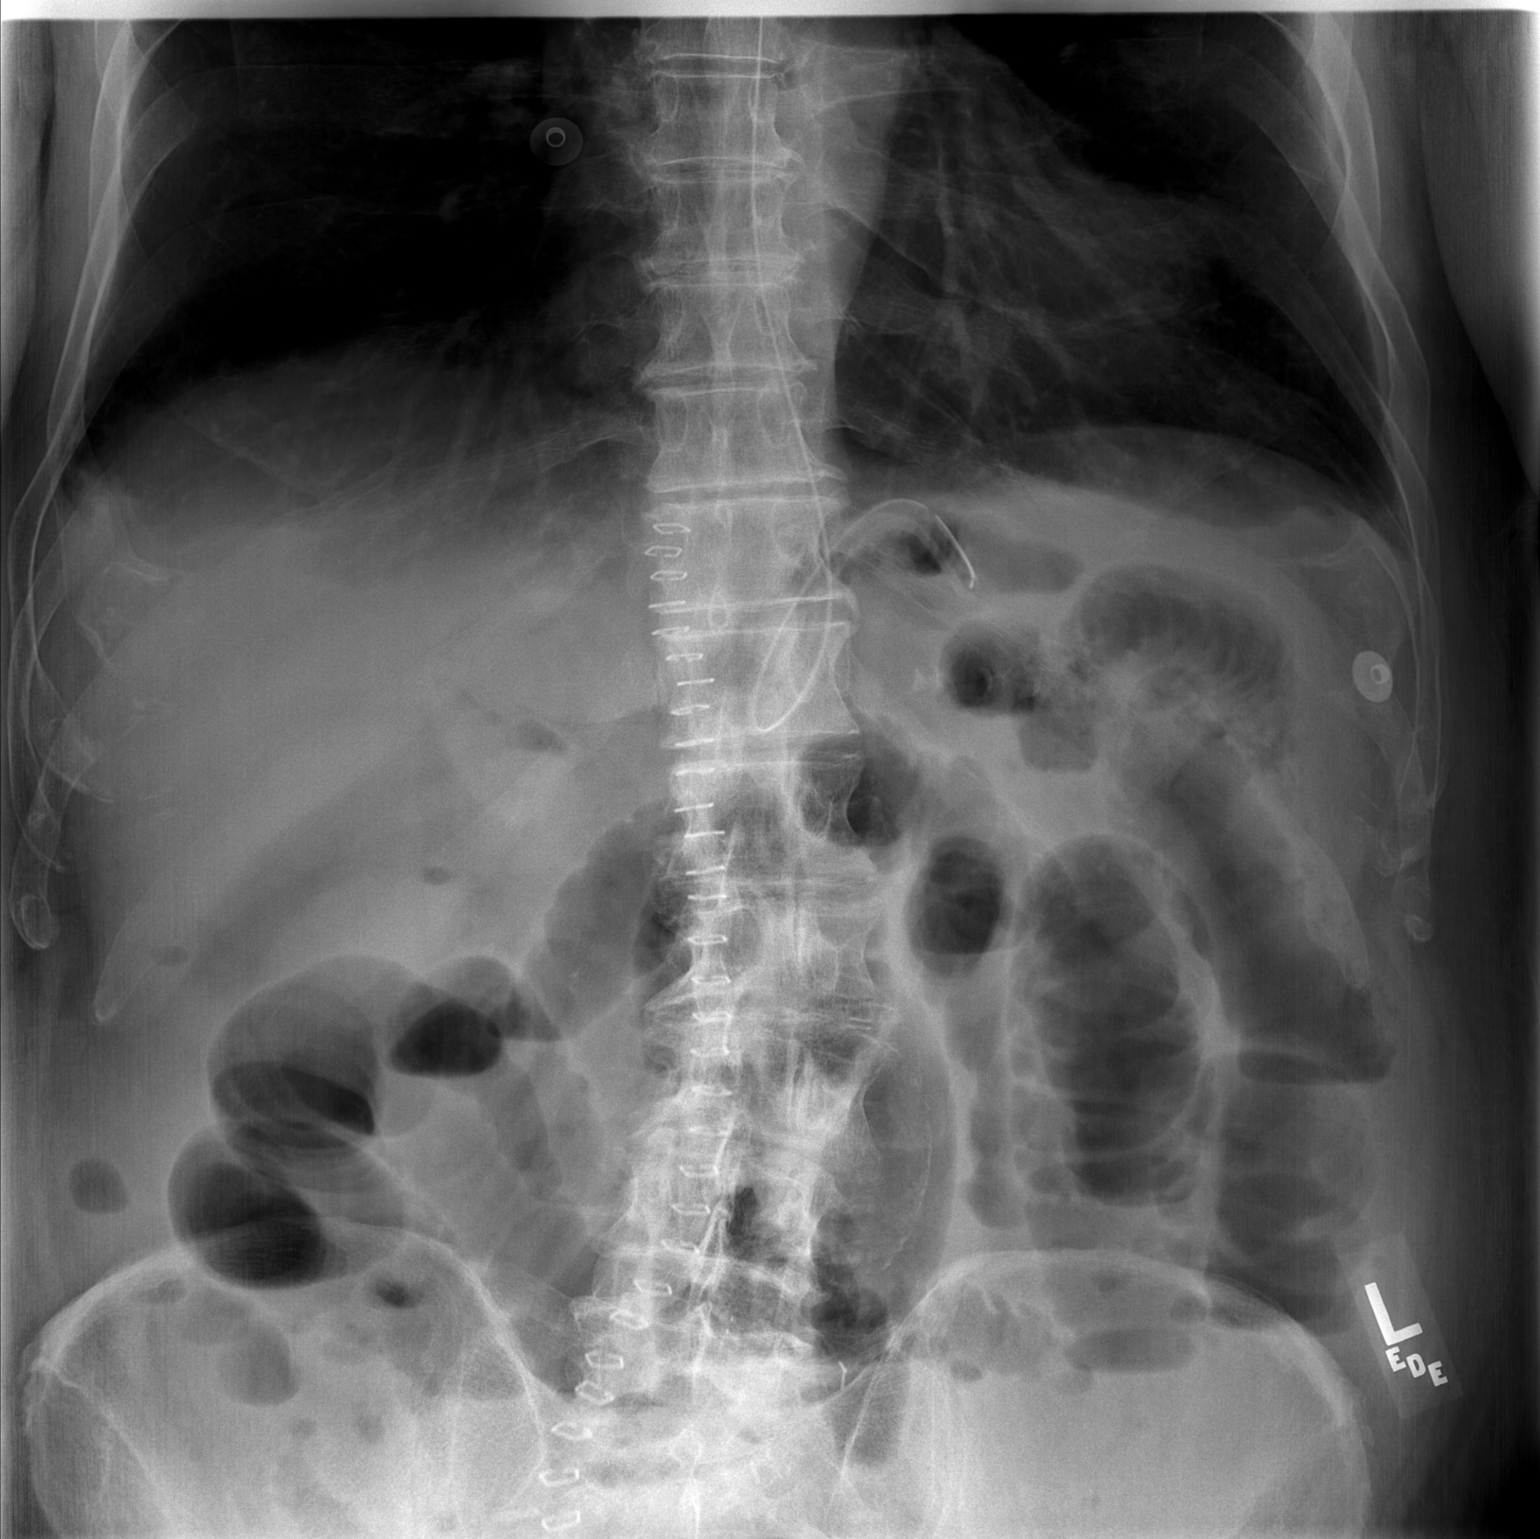

[t abdomen supine]
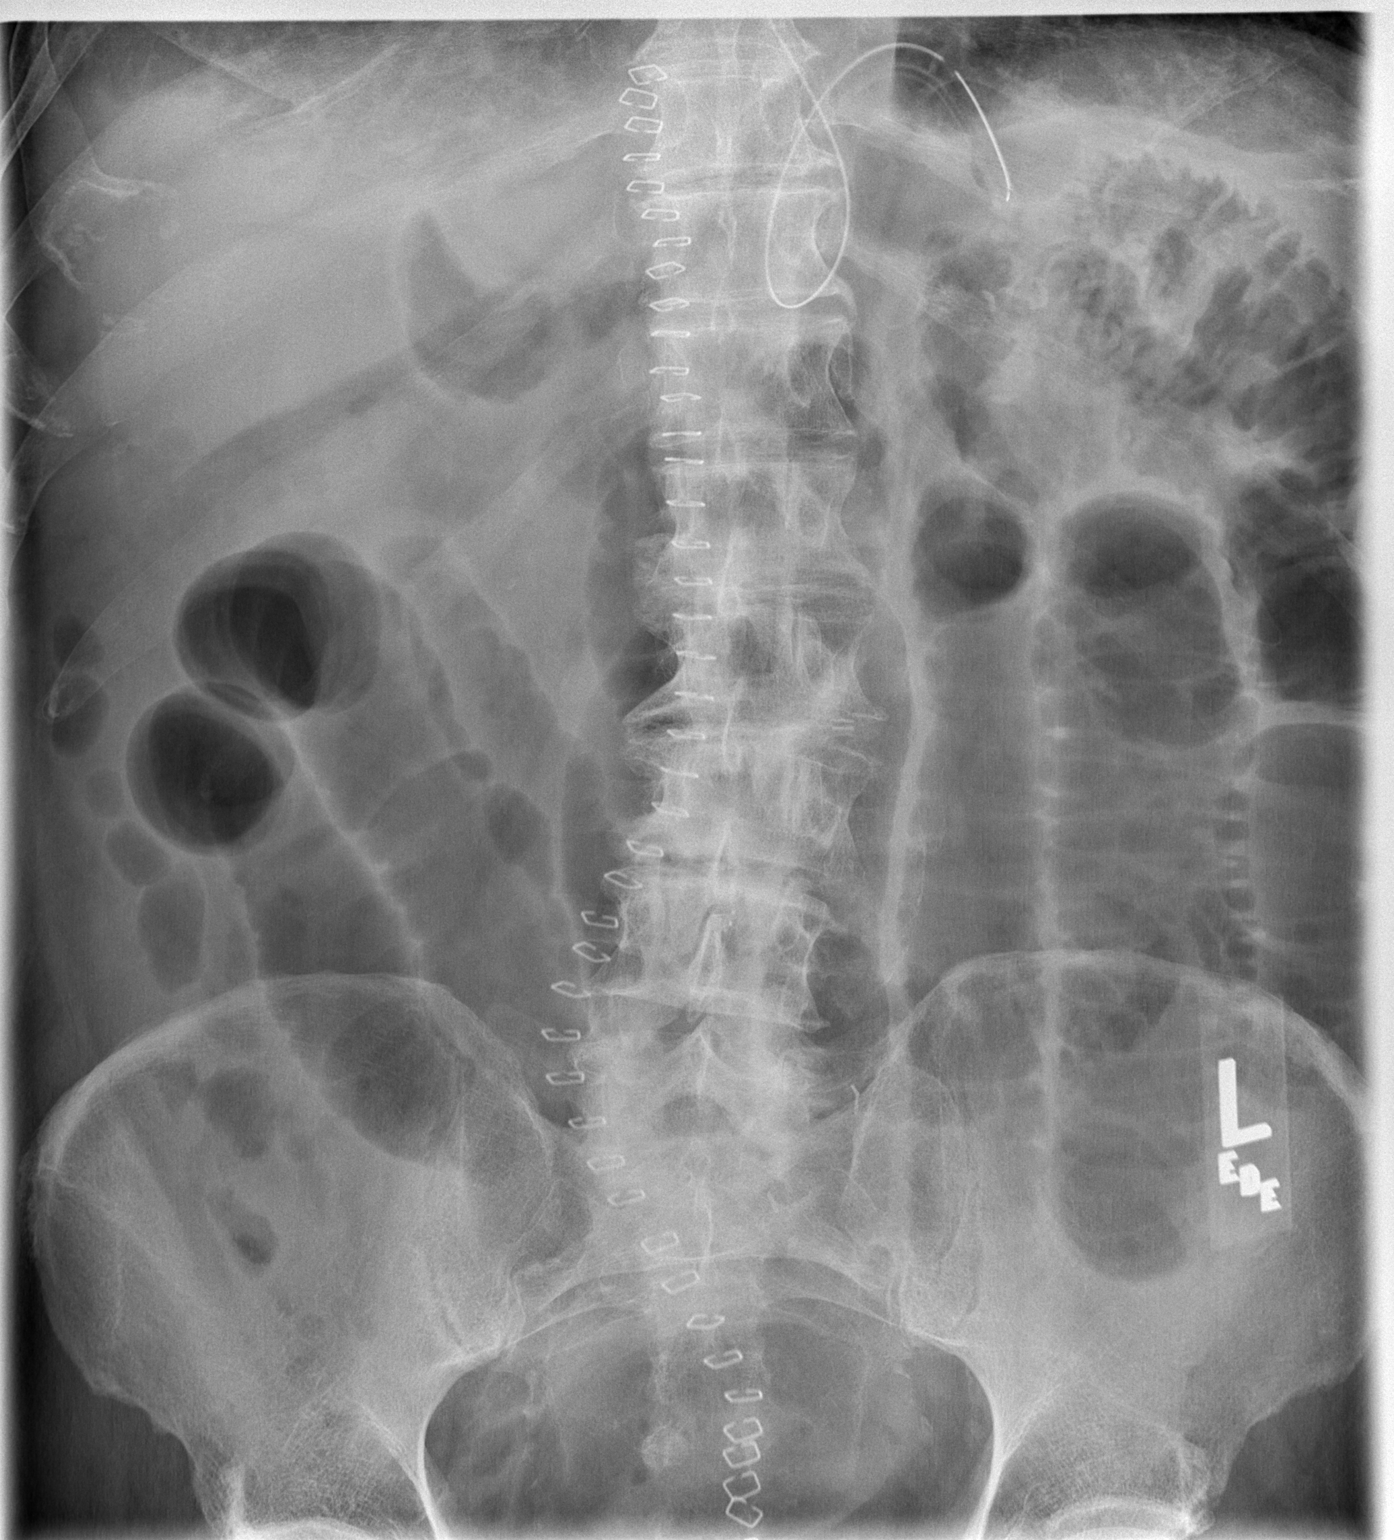

[2 of 2 positions shown; findings below may reference images not displayed]

FINDINGS: Unchanged nasogastric tube, tip in the region of the
gastric fundus.  Visualized lung bases are clear.  Abdominal
midline skin staples re-identified.  Multiple gas-filled mild to
moderately dilated small bowel loops are re-identified in the
abdomen.  There is now a small volume of gas in the nondistended
ascending colon.  There is otherwise a paucity of colonic gas as on
the prior exams.  No pneumoperitoneum identified.   Stable
visualized osseous structures.
IMPRESSION: Dilated gas filled small bowel loops with a paucity of colonic gas
is not significantly changed, and remain suspicious for a distal
small bowel obstruction.  However, there is now a small volume of
gas in the ascending colon. Radiographic follow-up and clinical
correlation recommended.

## 2009-11-16 IMAGING — CR DG CHEST 1V PORT
1 series · 1 of 1 positions shown · non-contrast
Comparison: 04/01/2008 and earlier.

CLINICAL DATA: 67-year-old male with PICC placement.

PORTABLE CHEST - 1 VIEW

[view not recorded]
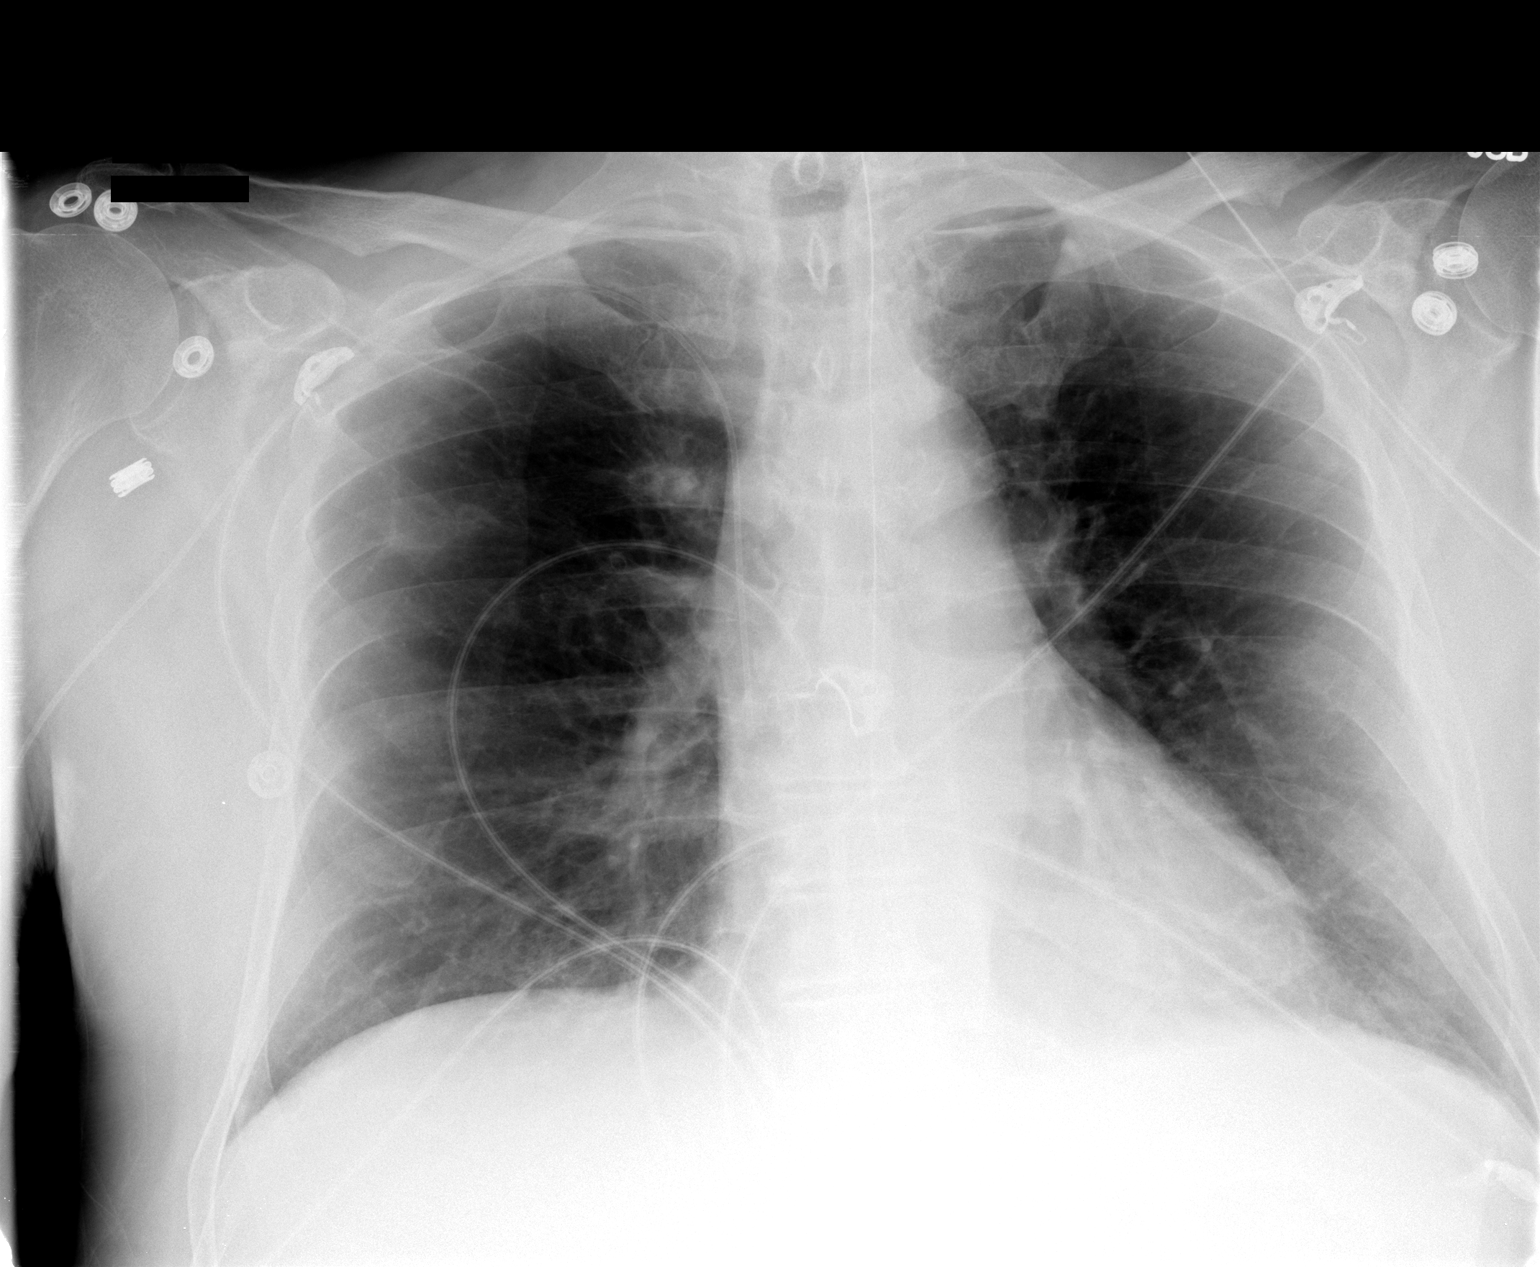

[1 of 1 positions shown; findings below may reference images not displayed]

FINDINGS: AP portable upright view at 9000 hours.  Previously seen
right IJ approach Myo catheters been removed.  Enteric tube
courses to the epigastric region, tip not included.  New right
upper extremity approach PICC line catheter, tip at the level of
the lower superior vena cava.  Cardiac size mediastinal contour are
within normal limits.  No pneumothorax, pulmonary edema or pleural
effusion.  Improved bibasilar ventilation with mild residual
atelectasis.
IMPRESSION: 1. Right upper extremity approach PICC line catheter, tip at the
level of the lower SVC.
2.  Right IJ Swan-Ganz catheter removed.
3.  Improved bibasilar ventilation.

## 2009-11-17 IMAGING — CR DG ABDOMEN 2V
3 series · 3 of 3 positions shown · non-contrast
Comparison: 04/11/2008

CLINICAL DATA: Evaluate ileus versus small bowel obstruction

ABDOMEN - 2 VIEW

[w abdomen upright]
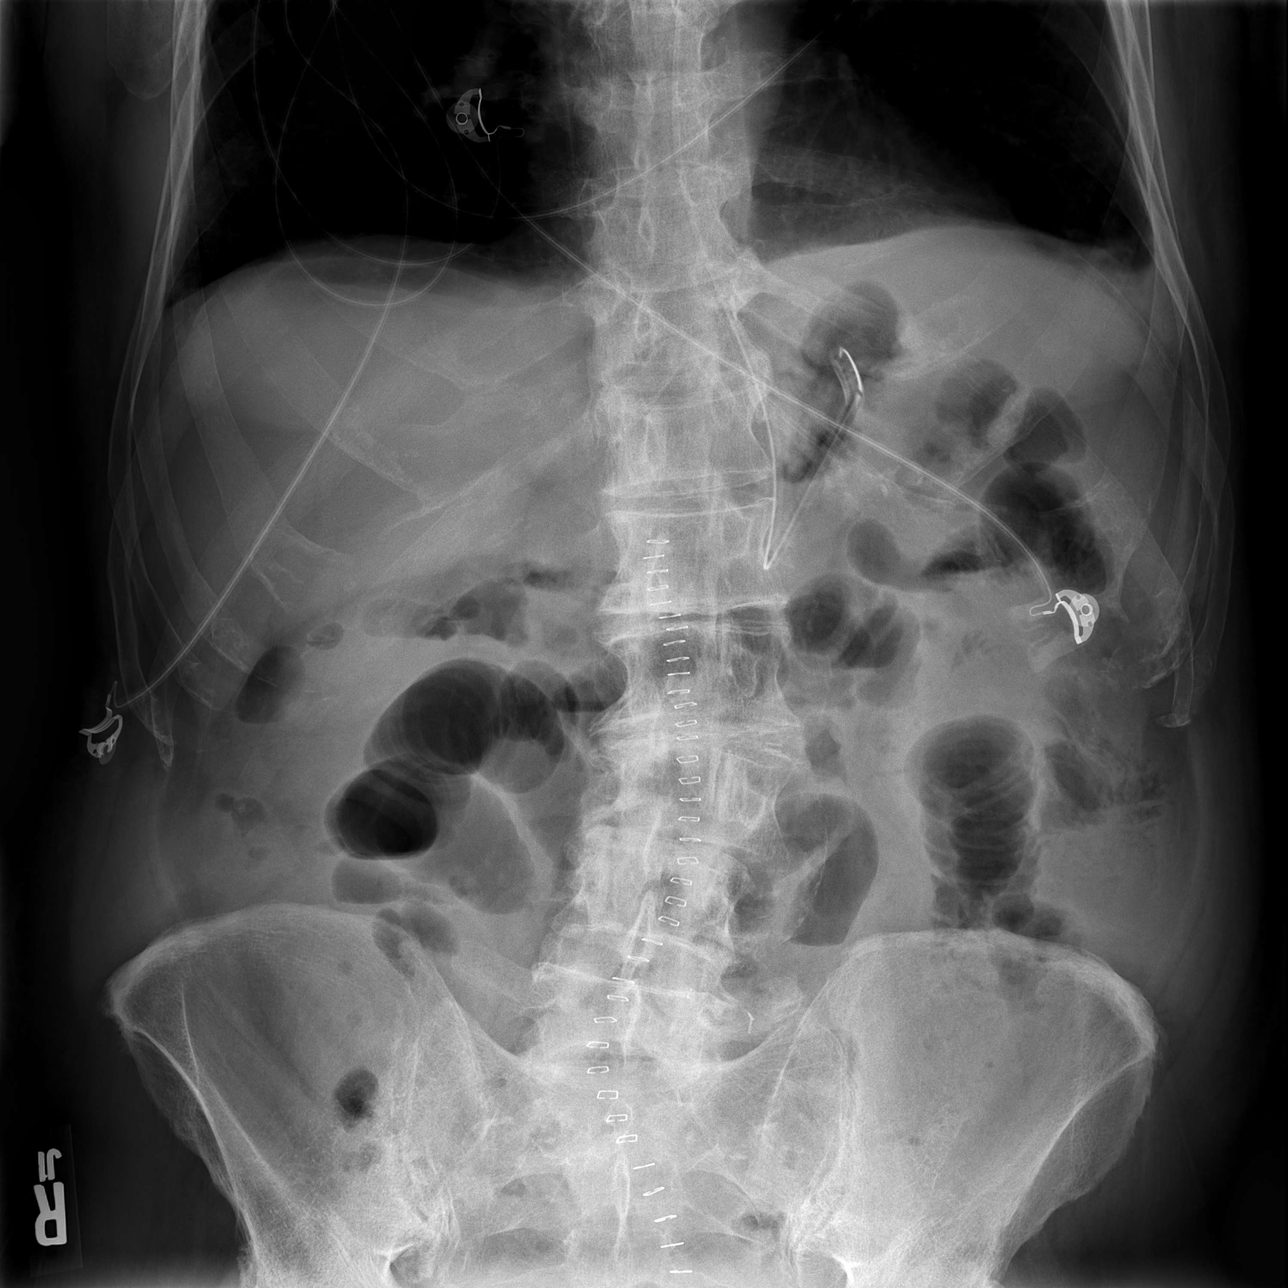

[t abdomen supine (1 of 2)]
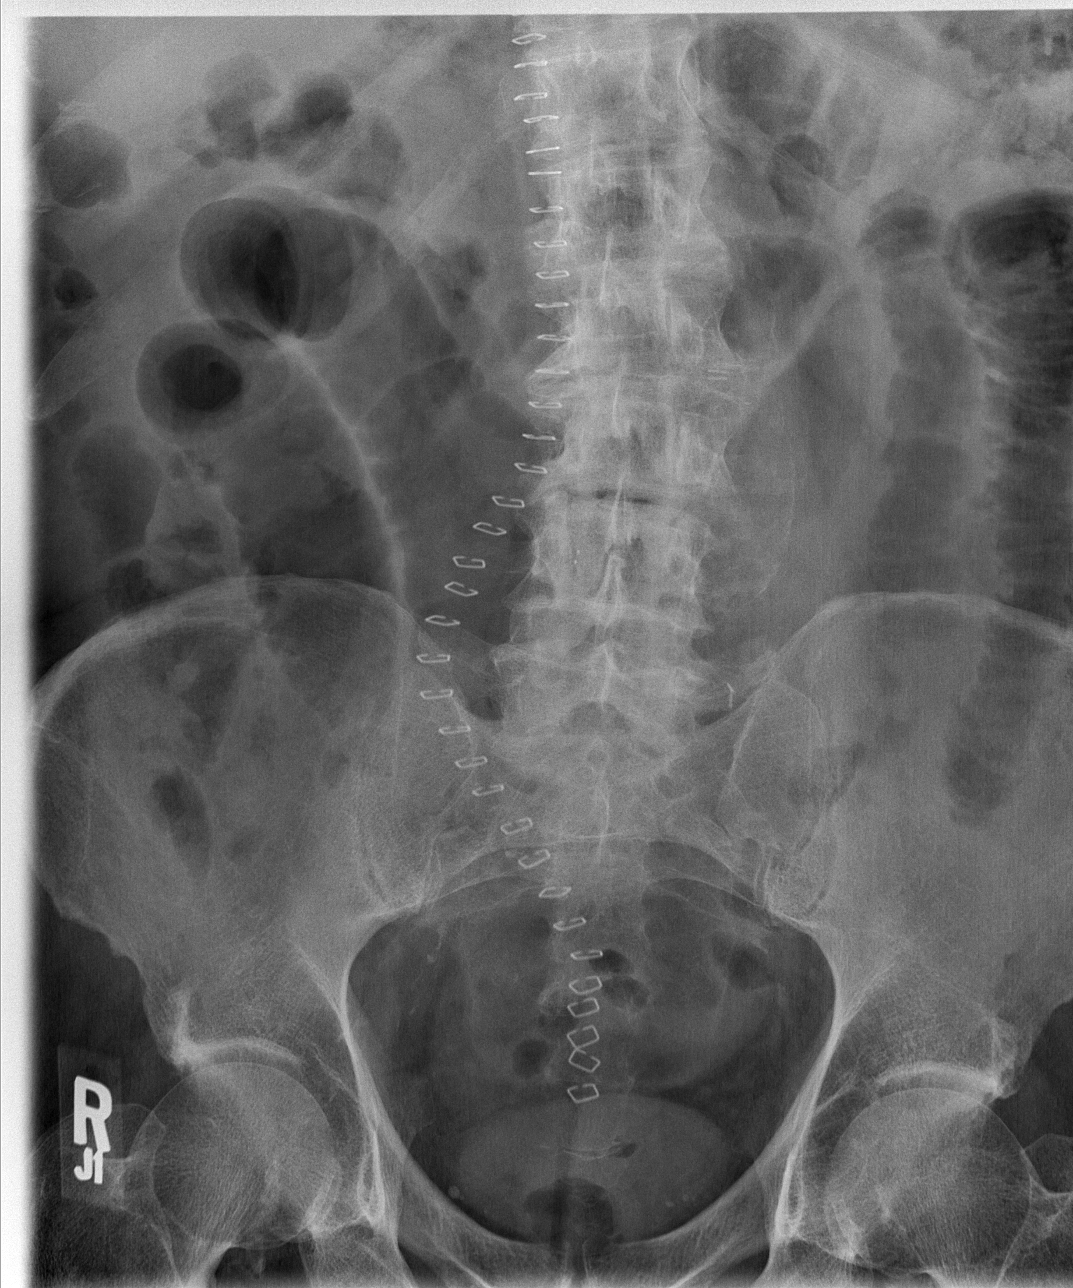

[t abdomen supine (2 of 2)]
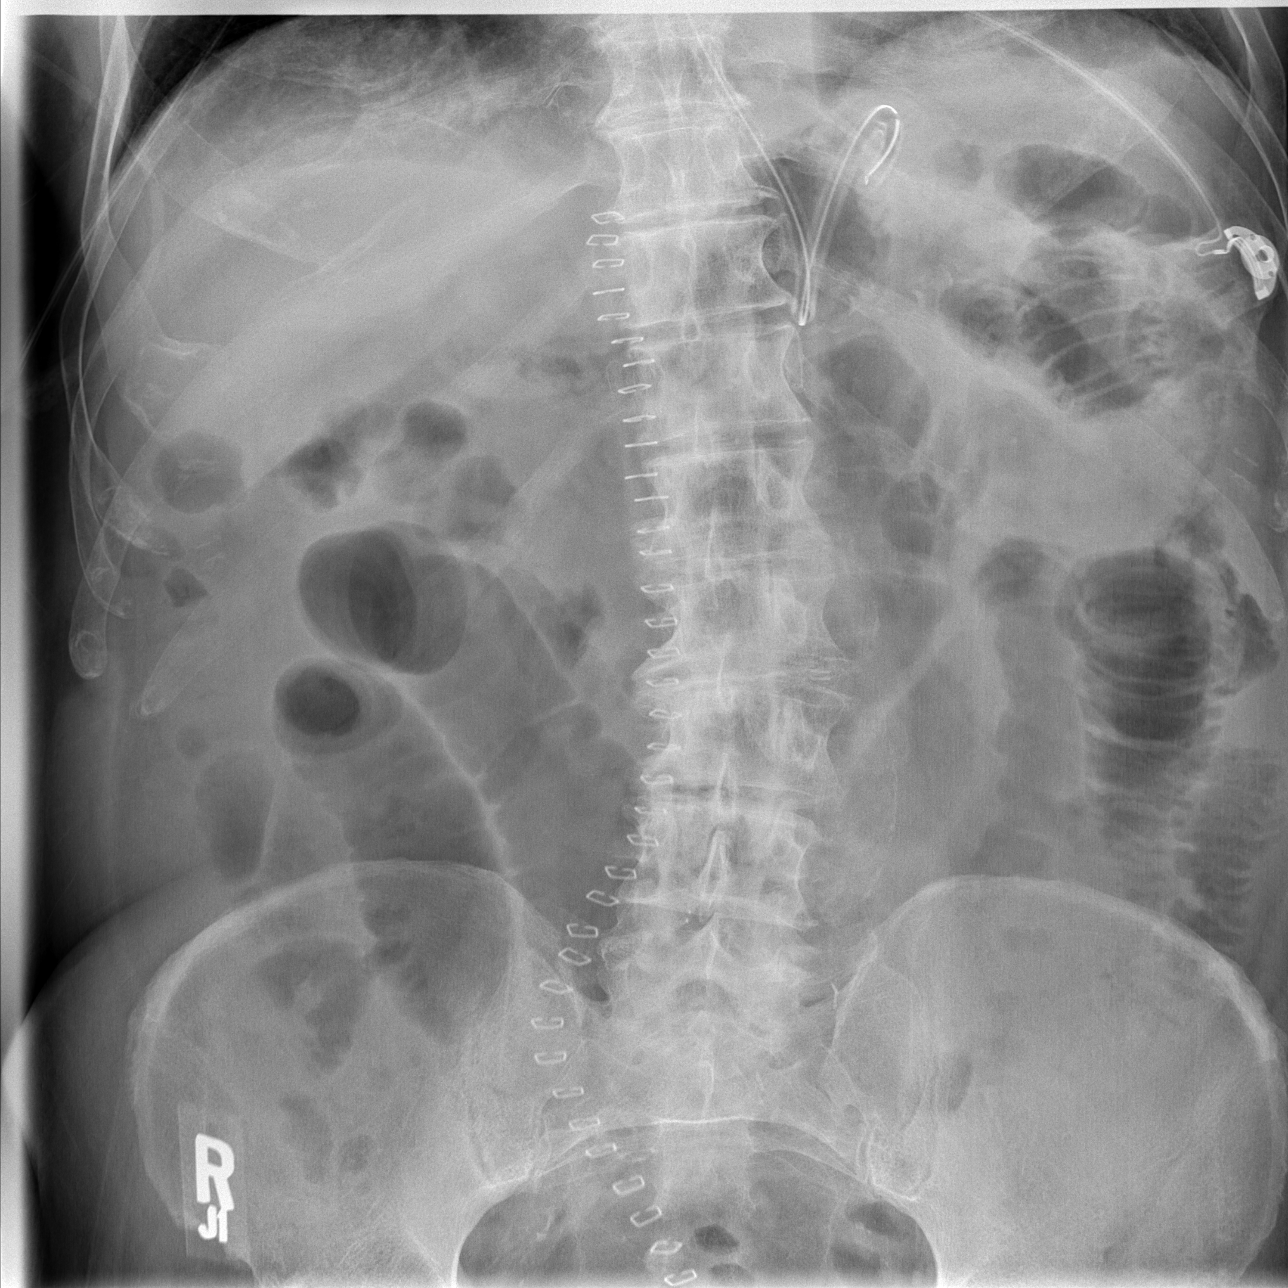

[3 of 3 positions shown; findings below may reference images not displayed]

FINDINGS: There is a nasogastric tube with tip in the proximal
gastric lumen.

Abnormally dilated loops of small bowel are again identified.  When
compared with the previous exam there is mild improvement in the
degree of small bowel distension.
IMPRESSION: 1.  Mild improvement in marked small bowel ileus.

## 2009-11-18 IMAGING — CR DG ABDOMEN 1V
2 series · 2 of 2 positions shown · non-contrast
Comparison: 04/12/2008 study

CLINICAL DATA: Status post abdominal aortic aneurysm repair.
Weakness.  Ileus versus small bowel obstruction.

ABDOMEN - 1 VIEW

[t abdomen supine (1 of 2)]
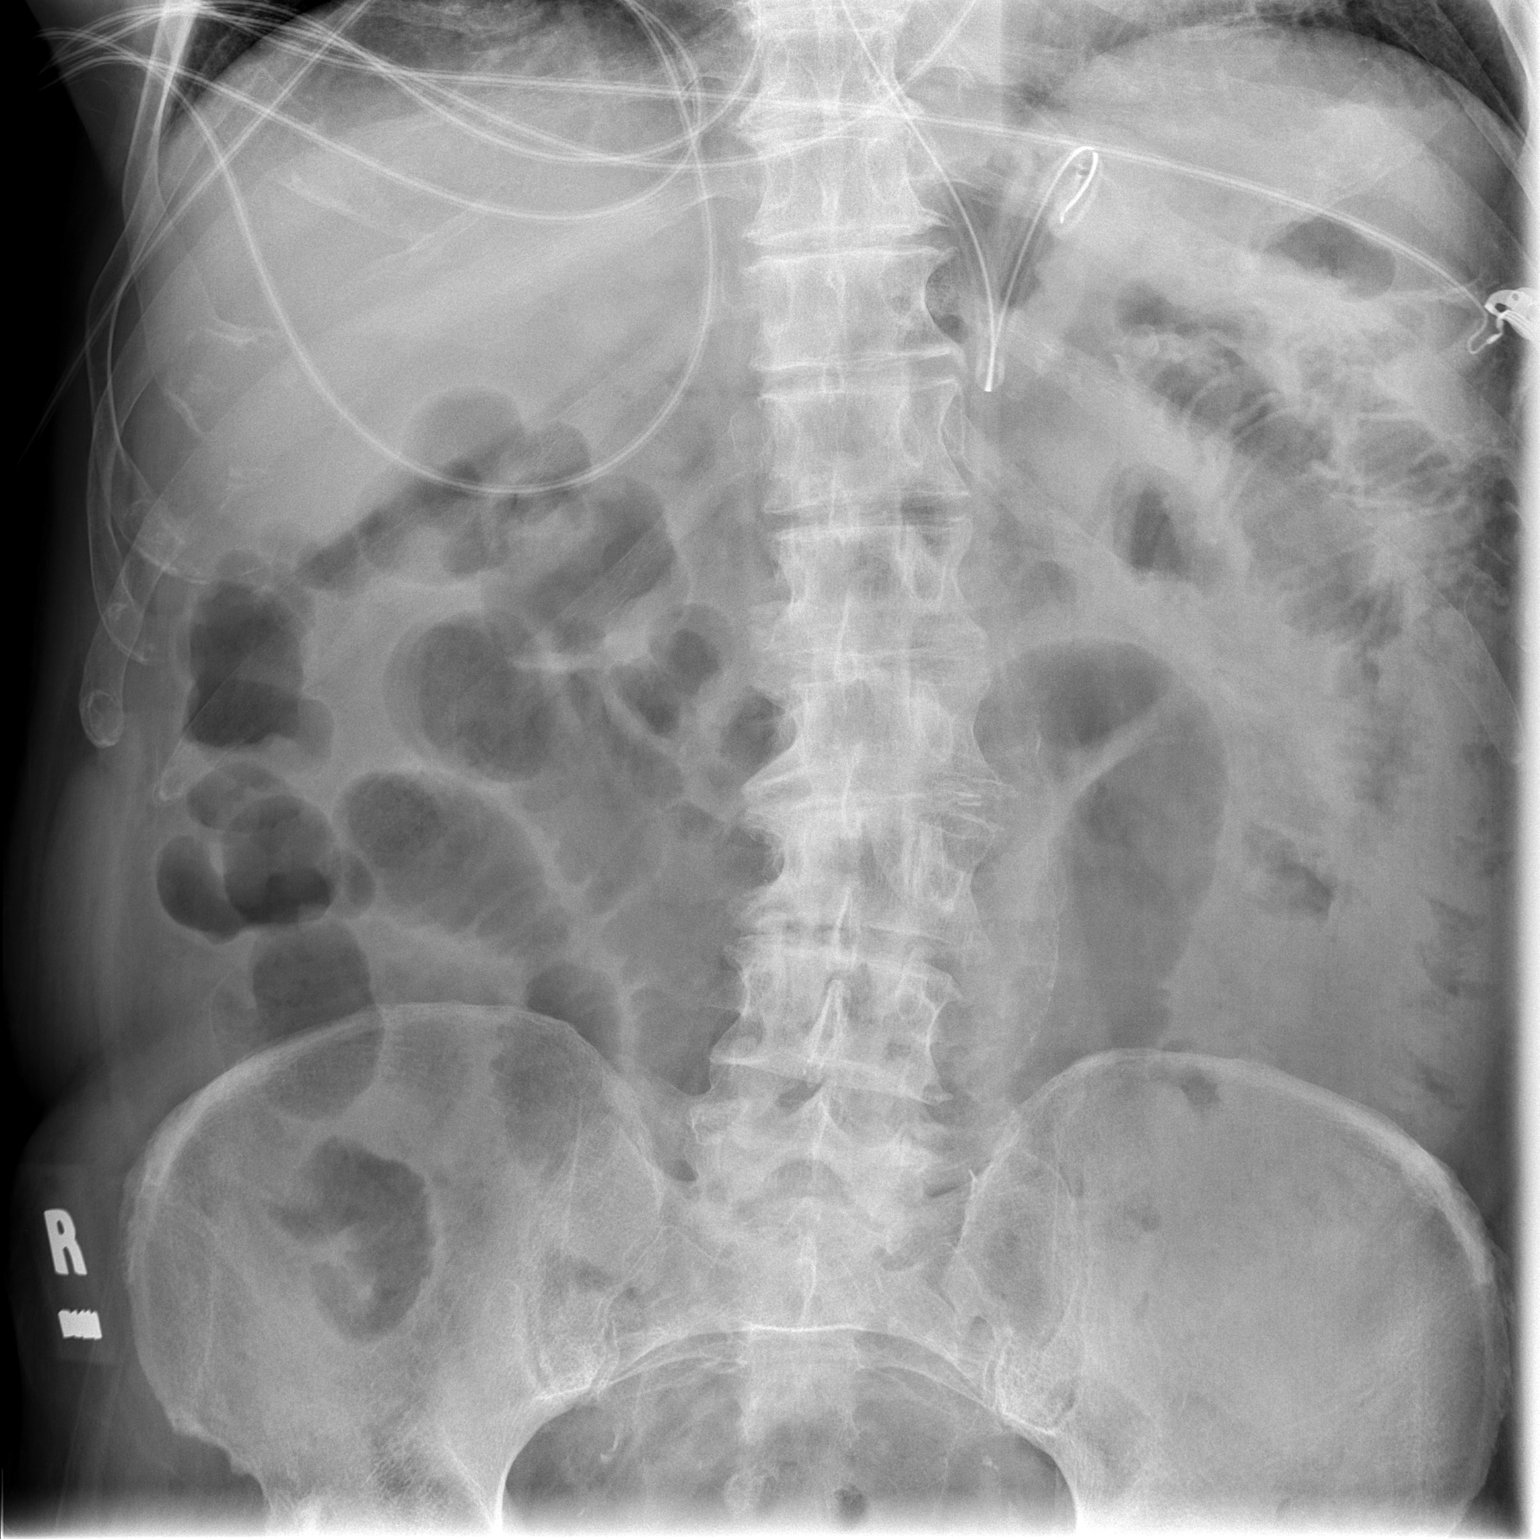

[t abdomen supine (2 of 2)]
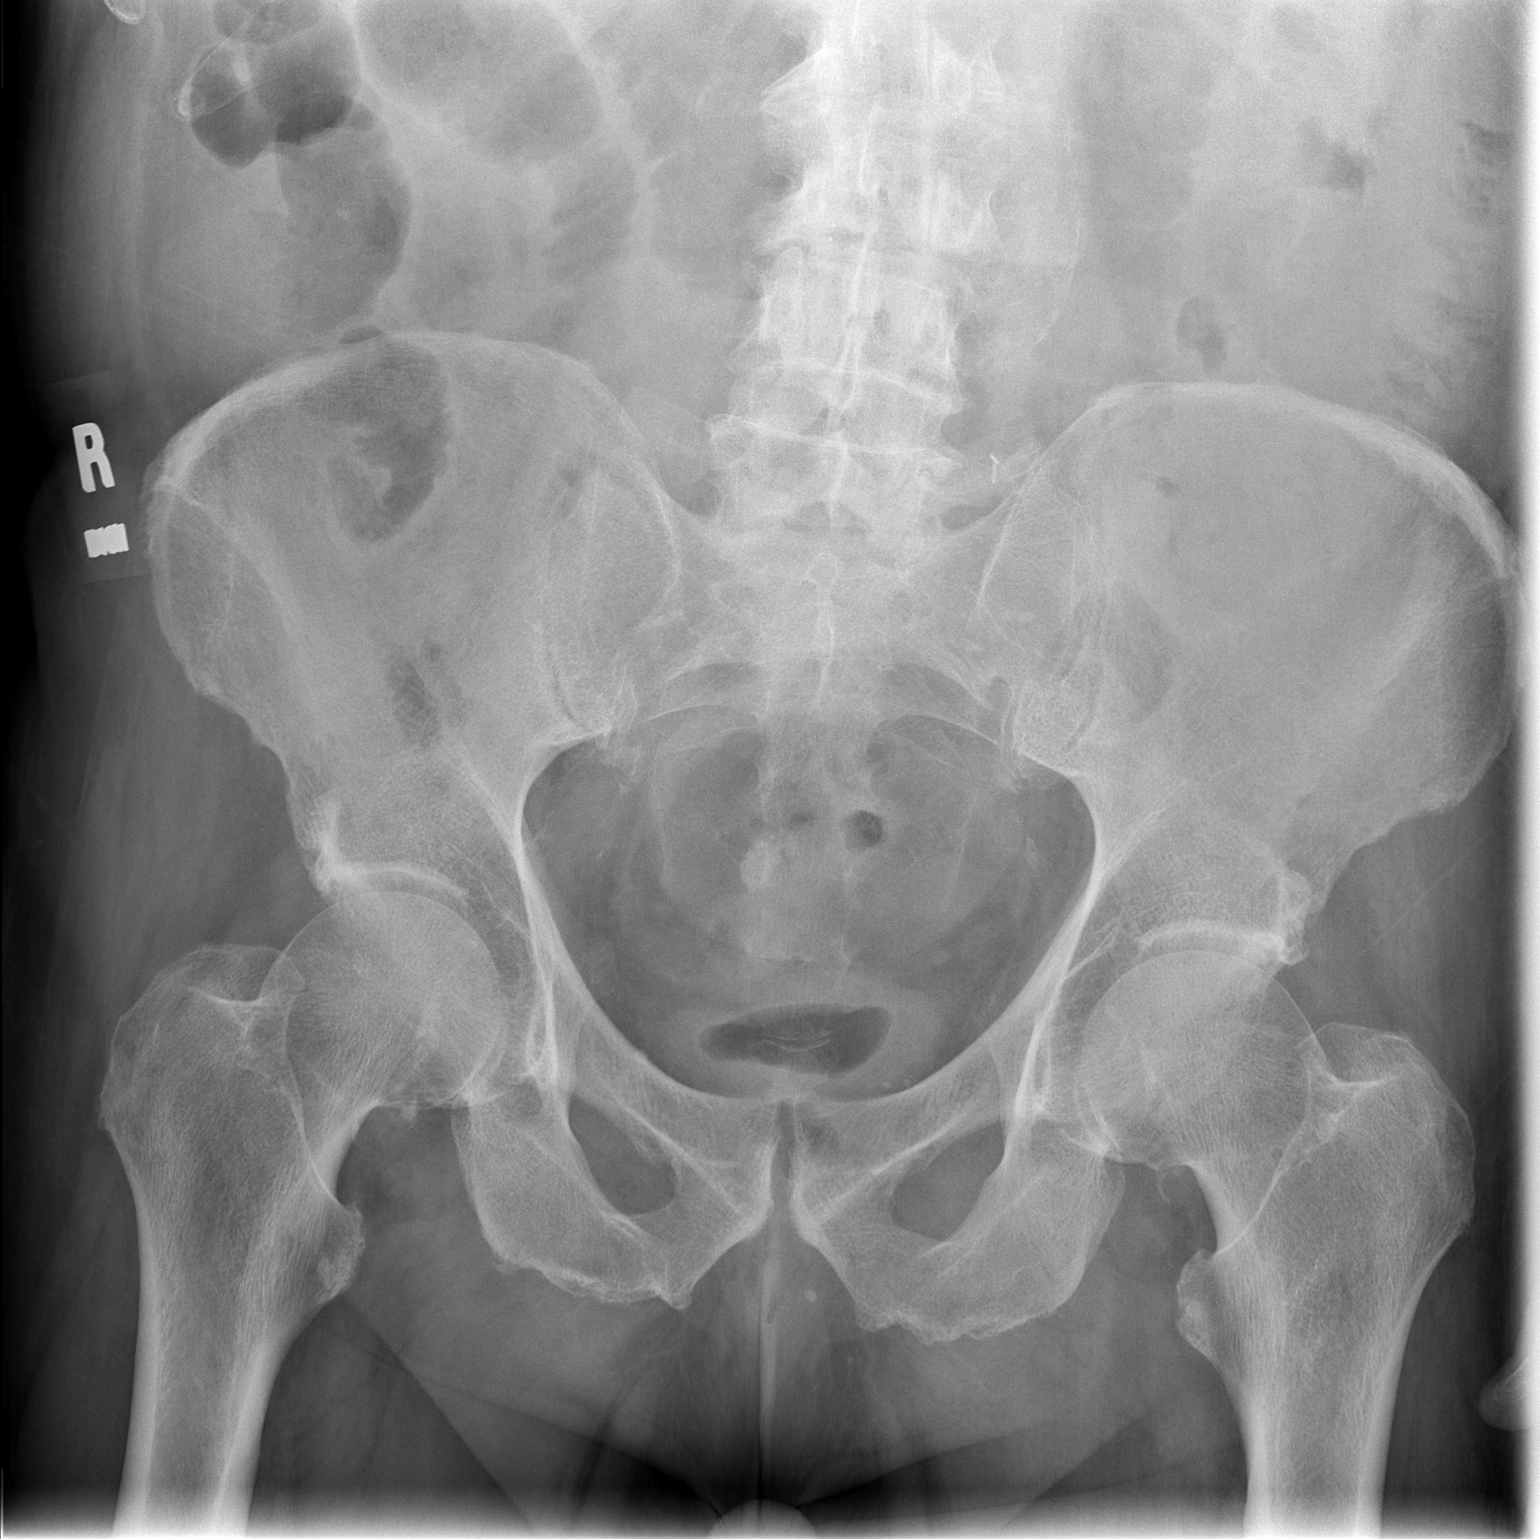

[2 of 2 positions shown; findings below may reference images not displayed]

FINDINGS: Enteric tube is in place with its tip in the proximal
stomach.

 Colon gas is present.

 Multiple dilated loops of small intestine are identified.  They
appear less distended than on the previous study but are still
distended.

 No opaque calculus is seen.  There is scoliosis convexity to the
left.  There are osteophytes in the spine with osteopenic
appearance of bones.. Aneurysmal calcification of the lower
abdominal aorta is seen.
IMPRESSION: Enteric tube in place with tip in proximal stomach.

Colon gas is present.

 There are multiple dilated loops of small intestine but they
appear less distended than on the previous study.

Appearance is consistent with ileus.

## 2009-11-19 IMAGING — CR DG ABDOMEN 2V
2 series · 2 of 2 positions shown · non-contrast
Comparison: 04/13/2008 and earlier, CT abdomen pelvis 12/04/2007.

CLINICAL DATA: 67-year-old male with abdominal aortic aneurysm,
pain and discomfort.  Diarrhea.

ABDOMEN - 2 VIEW

[w abdomen upright]
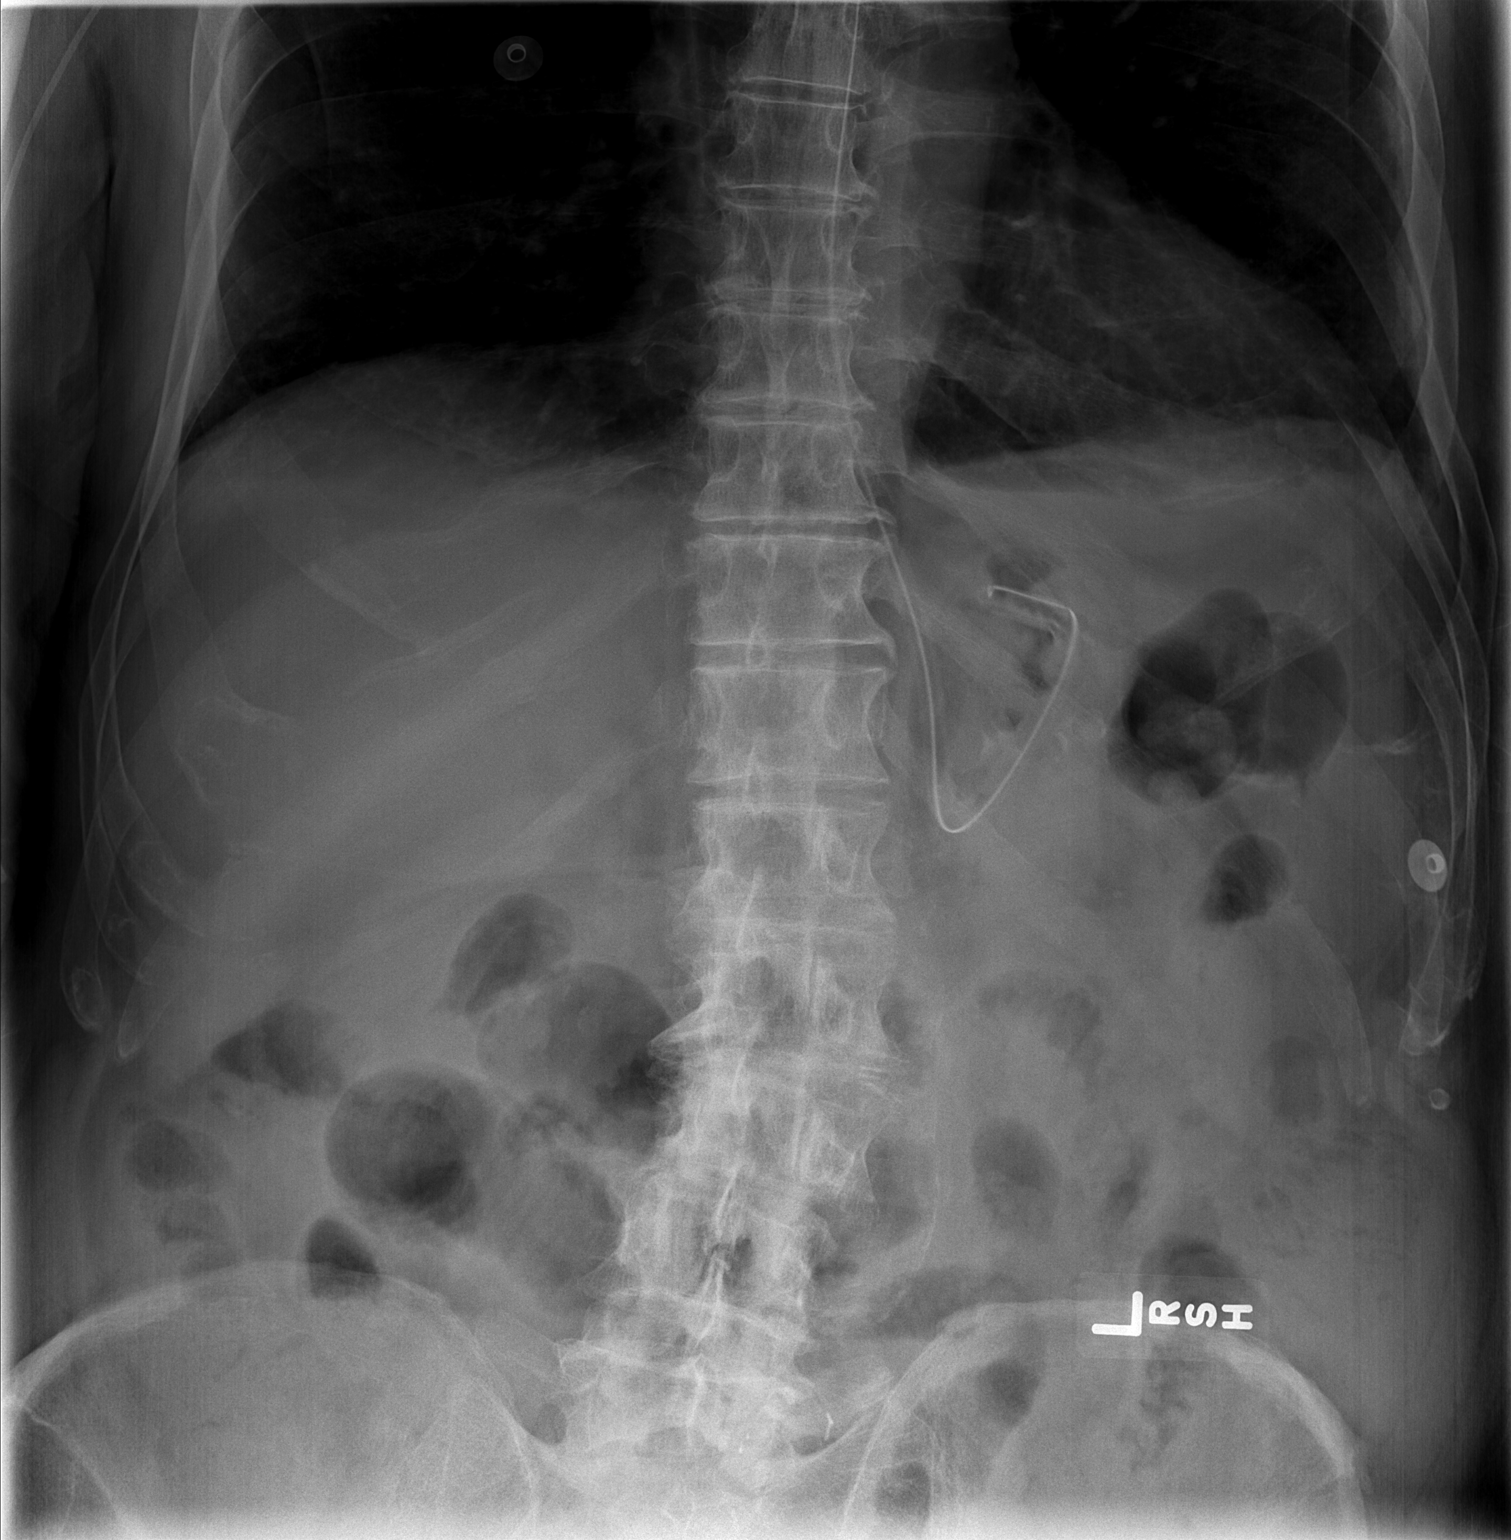

[t abdomen supine]
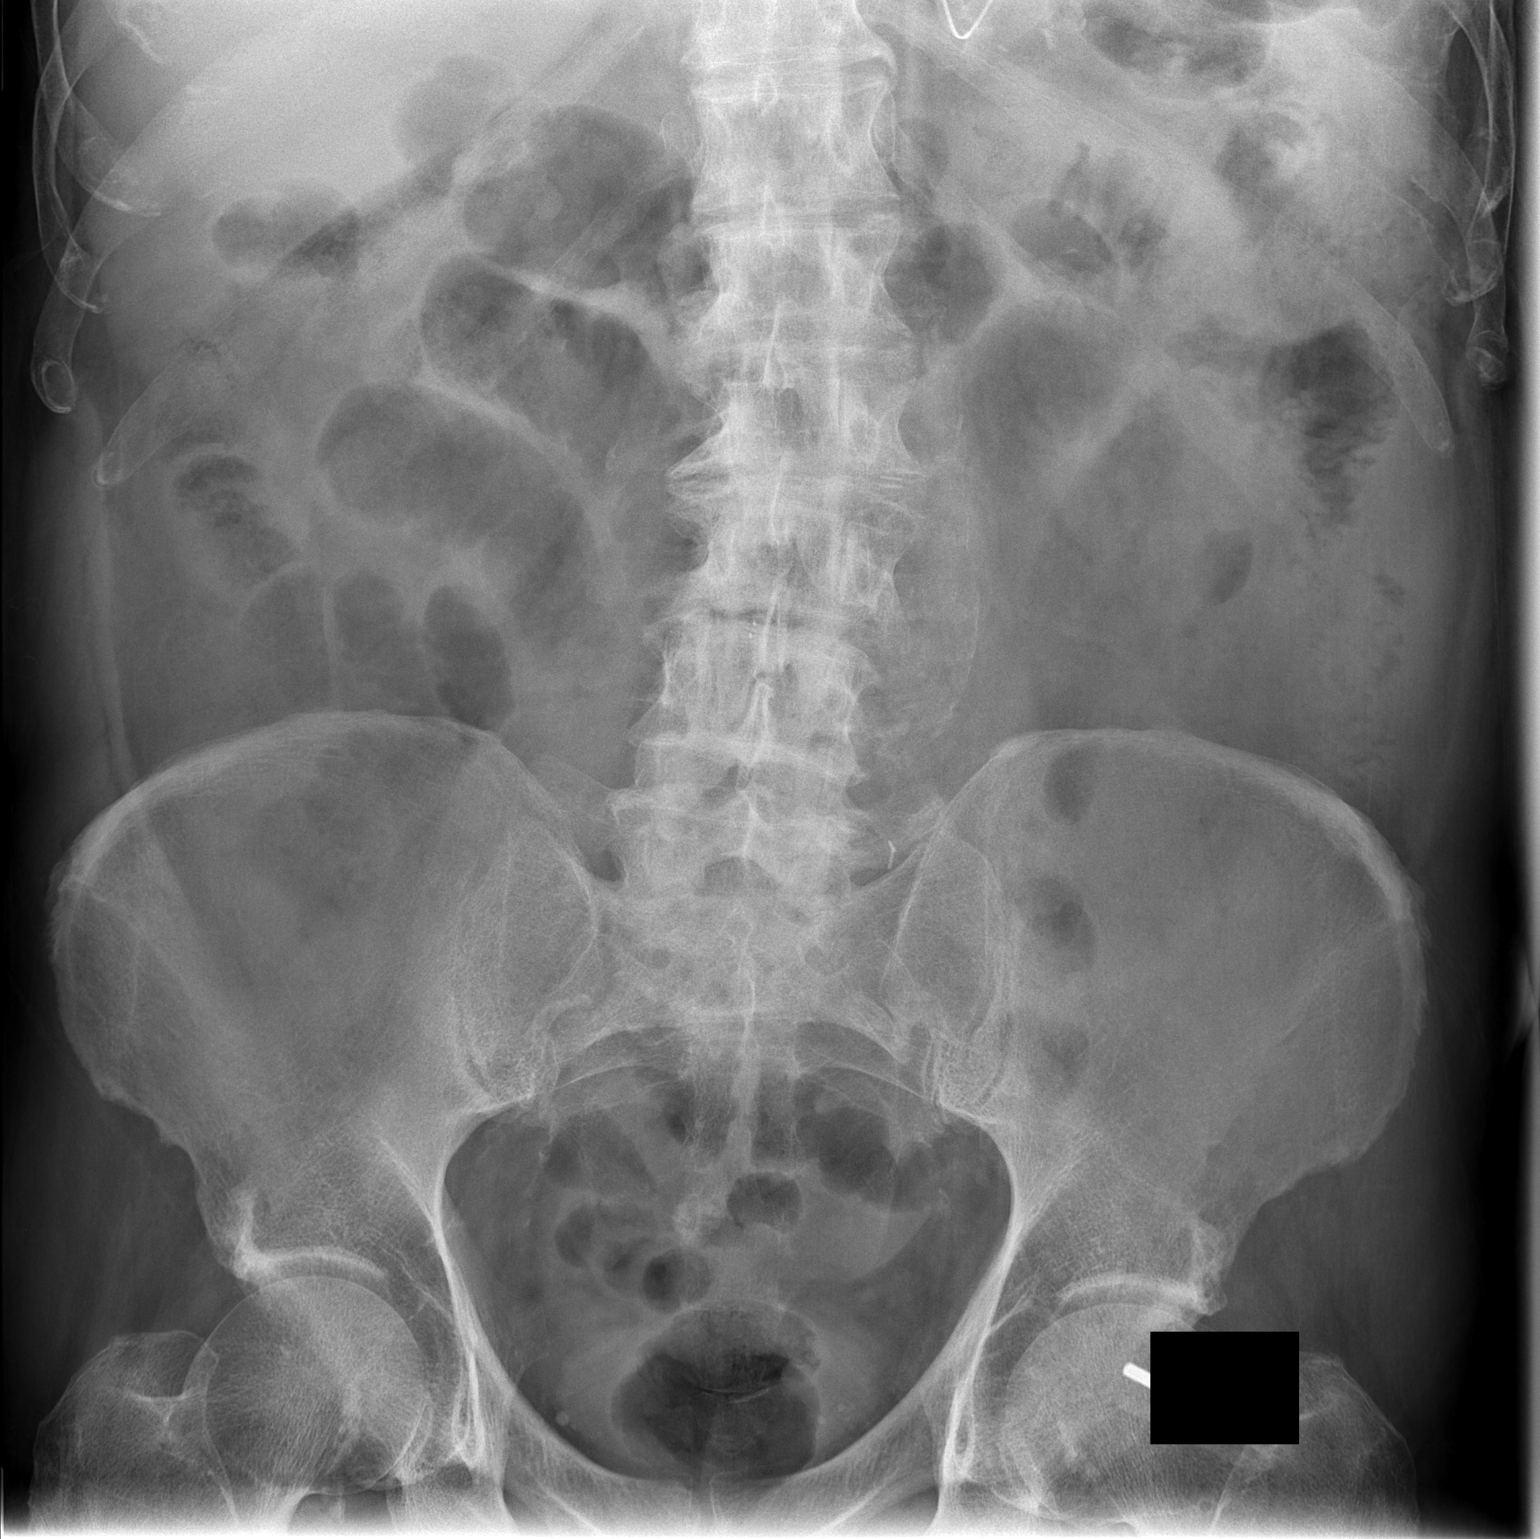

[2 of 2 positions shown; findings below may reference images not displayed]

FINDINGS: Stable enteric tube in the gastric fundus.  Partially
calcified infrarenal abdominal aortic aneurysm evident in the lower
abdomen.  Stable bowel gas pattern demonstrating multiple distal
gas filled small bowel loops measuring at the upper limits of
normal with colonic gas now seen at the splenic flexure and
extending to the rectum.  No pneumoperitoneum evident.  No acute
osseous abnormality.  Visualized lung bases are clear.
IMPRESSION: Stable bowel gas pattern with some persistent findings of distal
small bowel ileus.

## 2009-11-23 ENCOUNTER — Encounter (INDEPENDENT_AMBULATORY_CARE_PROVIDER_SITE_OTHER): Payer: Self-pay | Admitting: Surgery

## 2009-11-23 ENCOUNTER — Ambulatory Visit (HOSPITAL_COMMUNITY): Admission: RE | Admit: 2009-11-23 | Discharge: 2009-11-24 | Payer: Self-pay | Admitting: Surgery

## 2009-11-29 IMAGING — CT CT ABDOMEN W/ CM
3 of 5 series · 14 of 32 positions shown, 19 images · IV contrast (omni 300/water & 80 ml omni 300)
Comparison: CT abdomen 12/04/2007, plain film 04/14/2008

CT ABDOMEN

CLINICAL DATA: Presyncope, post AAA repair of 03/31/2008

CT ABDOMEN AND PELVIS WITH CONTRAST
TECHNIQUE: Multidetector CT imaging of the abdomen and pelvis was
performed using the standard protocol following bolus
administration of intravenous contrast.
Contrast: 80 ml Omnipaque

[Series 2: routine abdomen · axial · 0.79mm/px · z∈[-432,-162]mm · 4 of 89 slices shown, 9 images]
[im 18/89  soft-tissue]
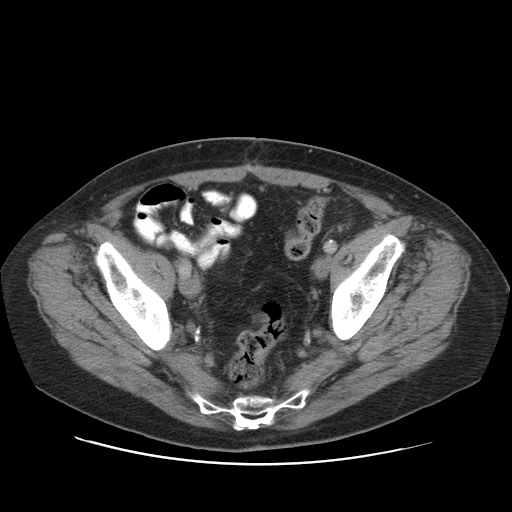
[im 18/89  lung]
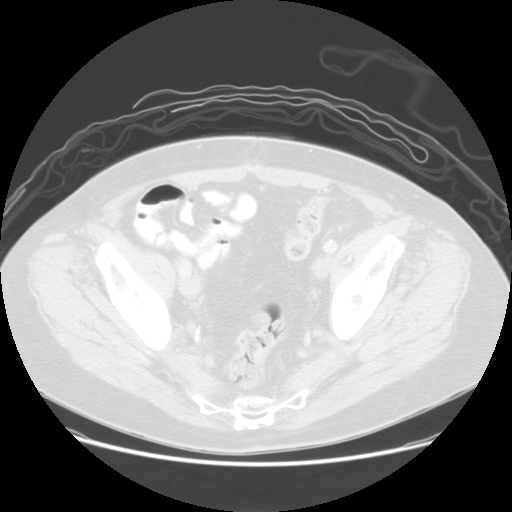
[im 18/89  bone]
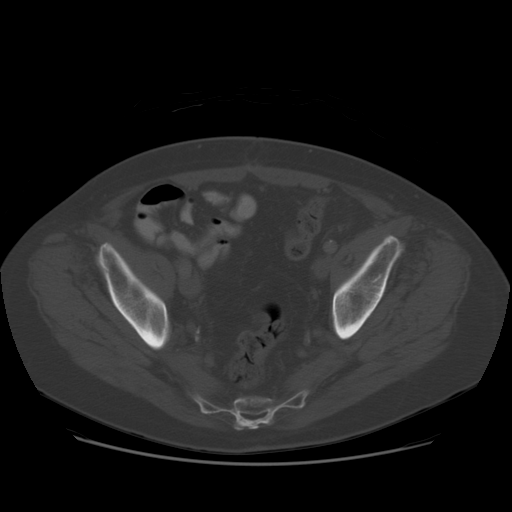
[im 36/89  soft-tissue]
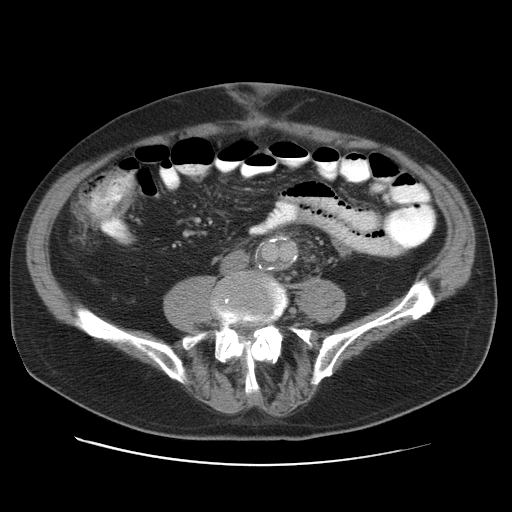
[im 36/89  lung]
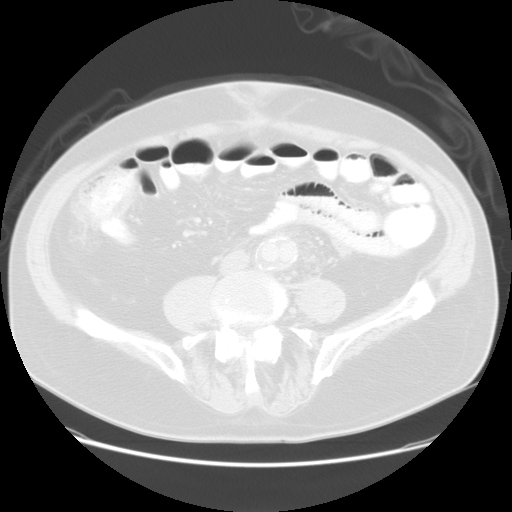
[im 53/89  soft-tissue]
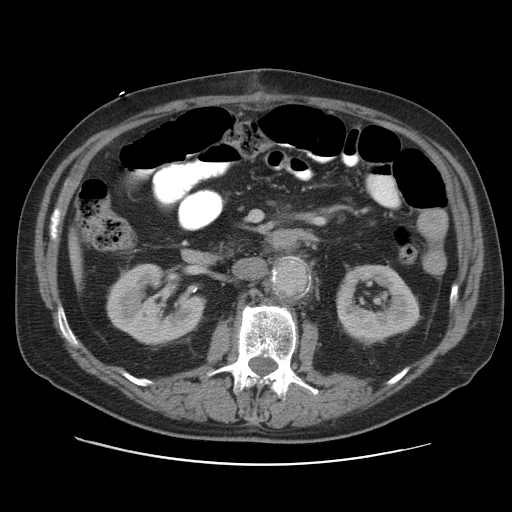
[im 53/89  lung]
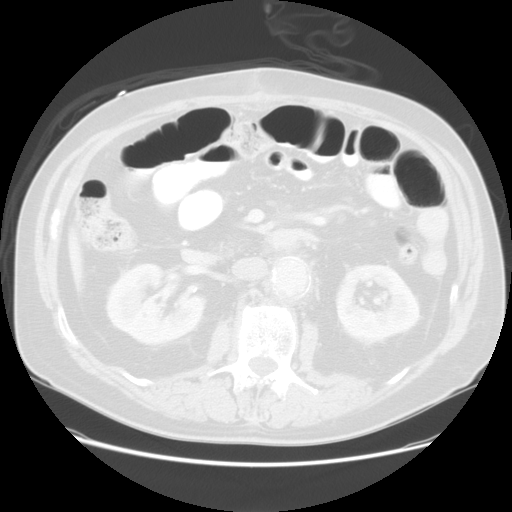
[im 71/89  soft-tissue]
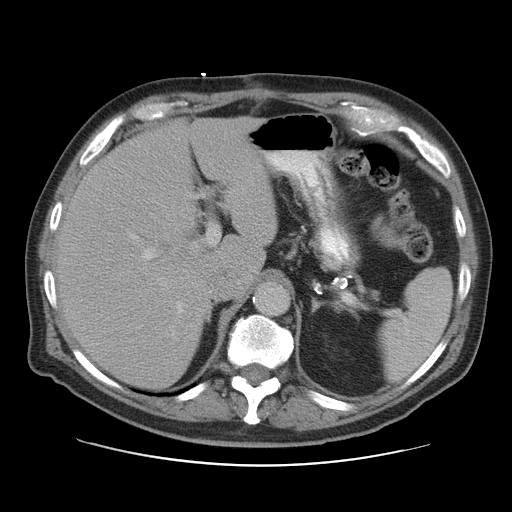
[im 71/89  lung]
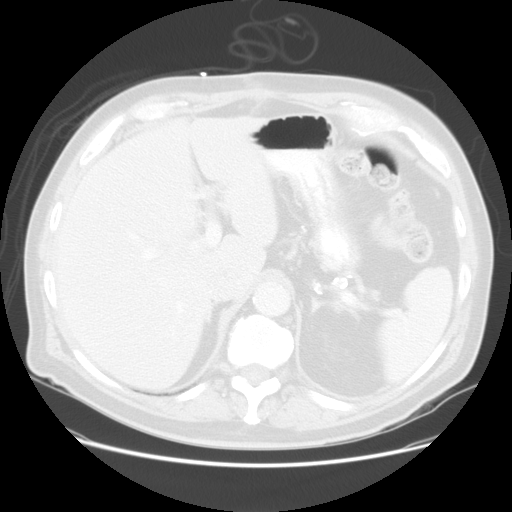

[Series 400: reformatted · coronal · 0.89mm/px · 2 of 147 slices shown (1 of 2)]
[im 17/147  soft-tissue]
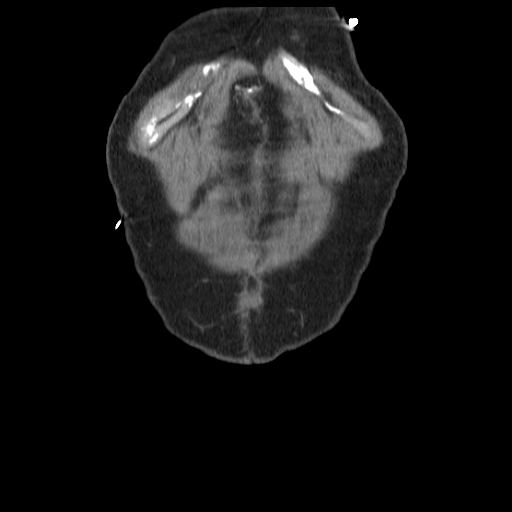
[im 33/147  soft-tissue]
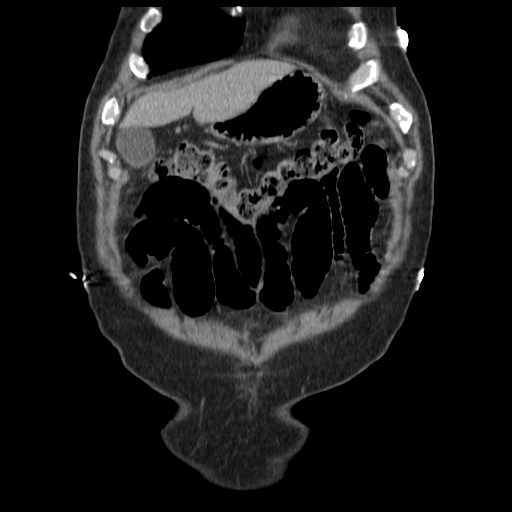

[Series 401: reformatted · sagittal · 0.89mm/px · 8 of 178 slices shown (2 of 2)]
[im 17/178  soft-tissue]
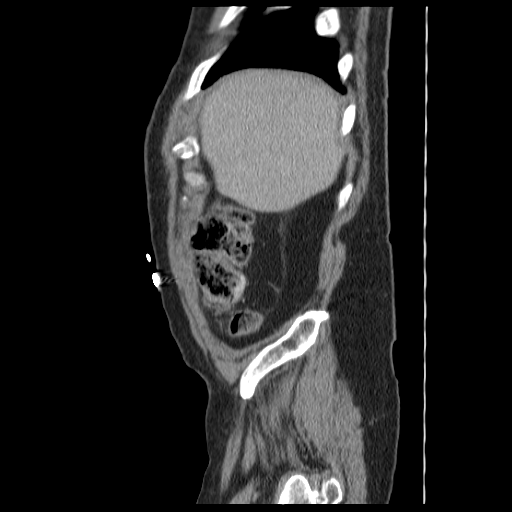
[im 33/178  soft-tissue]
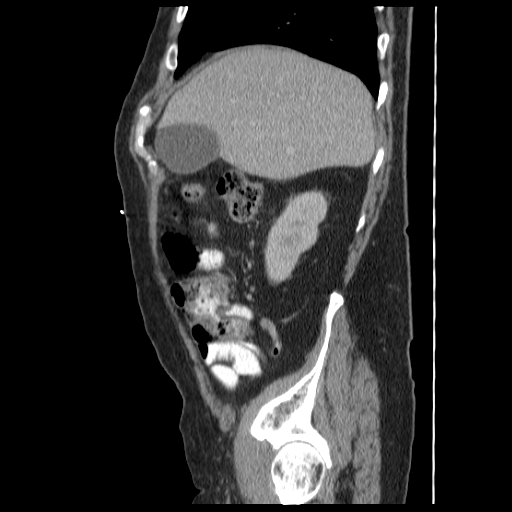
[im 65/178  soft-tissue]
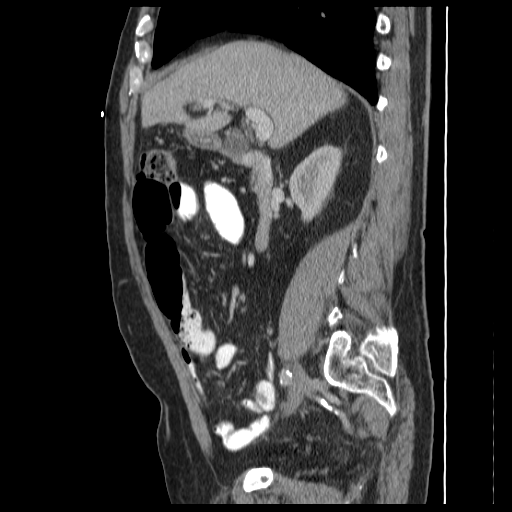
[im 81/178  soft-tissue]
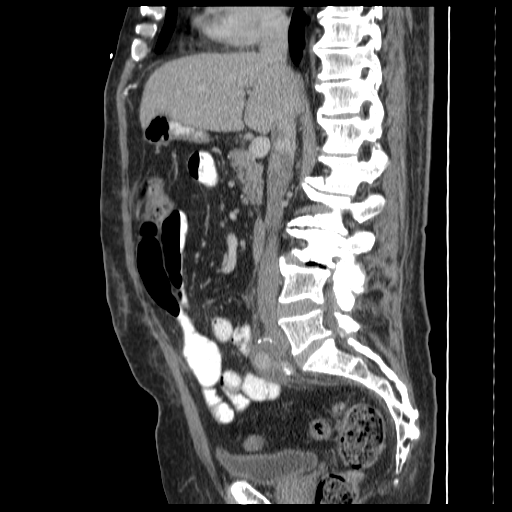
[im 97/178  soft-tissue]
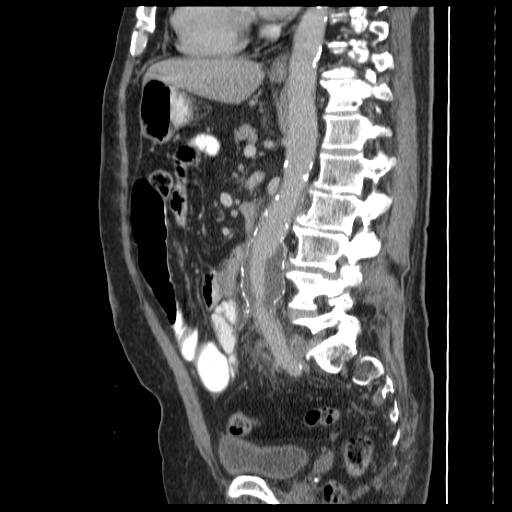
[im 113/178  soft-tissue]
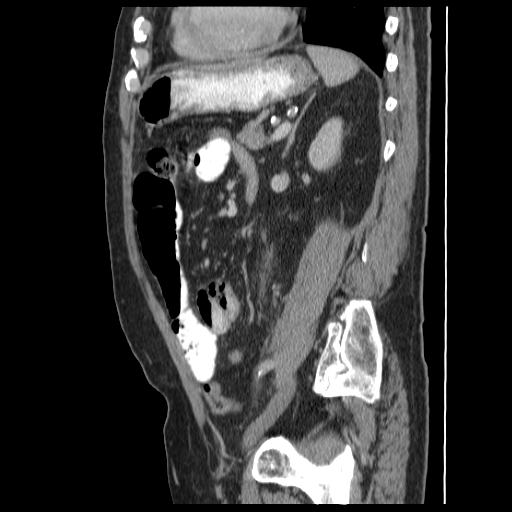
[im 145/178  soft-tissue]
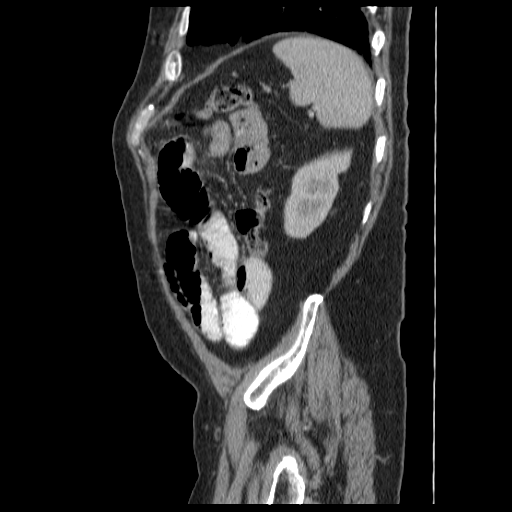
[im 161/178  soft-tissue]
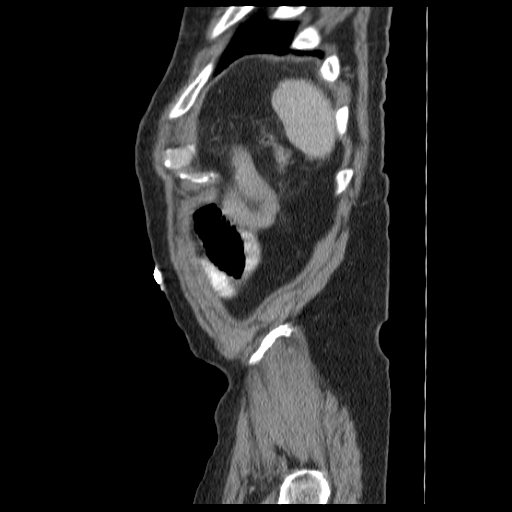

[14 of 32 positions shown; findings below may reference images not displayed]

FINDINGS: 9 mm noncalcified nodule at the left lung base (image
70) is unchanged from prior.  There is small amount of fluid in the
oblique fissures slightly increased from prior.  No layering
pleural effusions.

No focal hepatic lesion.  The heart  demonstrates a small
pericardial effusion which  is unchanged from prior.  Gallbladder
appears normal.  The pancreas, spleen, adrenal glands, and kidneys
appear normal.  The kidneys enhance symmetrically.

There is a midline ventral incisional wound which appears well
healed without evidence of fluid collections.

The stomach and small bowel appear normal.  No evidence of bowel
obstruction.  Colon appears normal.

There is interval surgical repair of infrarenal abdominal aortic
aneurysm.  There is retroperitoneal periaortic stranding
surrounding which is felt to be within normal limits for
postsurgical interval.  No evidence of extravasation.  No free
fluid in the abdomen.
IMPRESSION: 1..  No acute abdominal findings.
2..  Expected post surgical inflammation surrounding the infrarenal
abdominal  aneurysm aortic repair. December 29, 07. Stable left lower lobe nodule from 7557.

CT PELVIS
FINDINGS: No free fluid in the pelvis.  The bladder, prostate,
rectum, sigmoid colon appear normal.  No evidence of pelvic
lymphadenopathy.  Abdominal aortic repair as described above.

Review of bone windows demonstrates no aggressive osseous lesions.
IMPRESSION: 1.  No acute process in the pelvis.

## 2010-08-11 IMAGING — CT CT L SPINE W/ CM
4 of 10 series · 10 of 34 positions shown, 11 images · non-contrast
Comparison: None.

CLINICAL DATA: Low back pain.

MYELOGRAM LUMBAR
TECHNIQUE: Contrast was instilled by Dr. Dr. Gurierrez at the L3-4
level.
Fluoroscopic time:  1.4 minutes.
TECHNIQUE: CT imaging of the lumbar spine was performed after
intrathecal contrast administration.  Multiplanar CT image
reconstructions were also generated.

[Series 2: l-spine · axial · 0.31mm/px · z∈[-338,-260]mm · 2 of 94 slices shown, 3 images]
[im 32/94  soft-tissue]
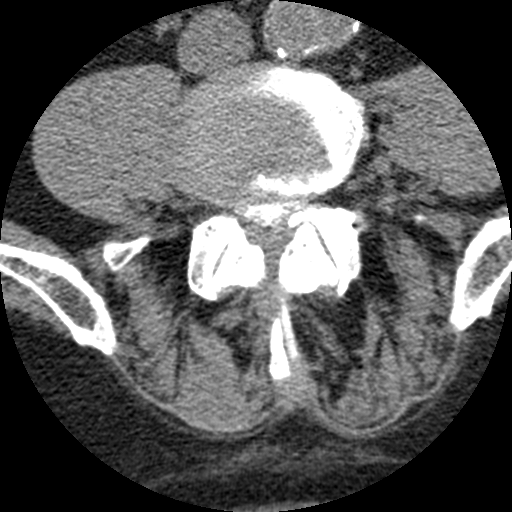
[im 32/94  bone]
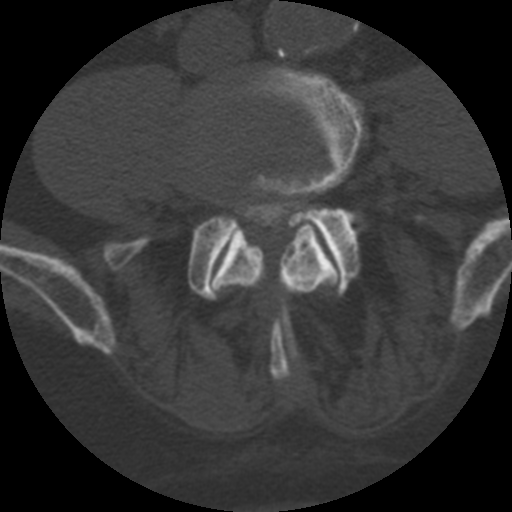
[im 63/94  bone]
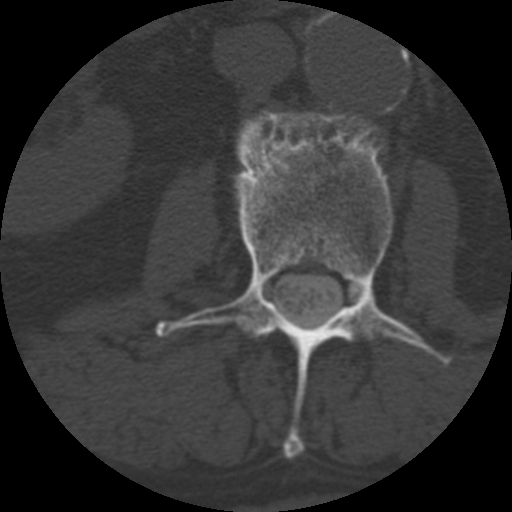

[Series 3: recon 2: l-spine · axial · 0.31mm/px · z∈[-338,-260]mm · 2 of 94 slices shown]
[im 32/94  bone]
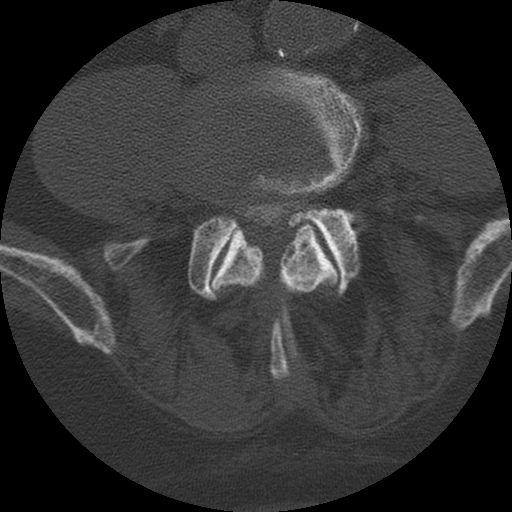
[im 63/94  bone]
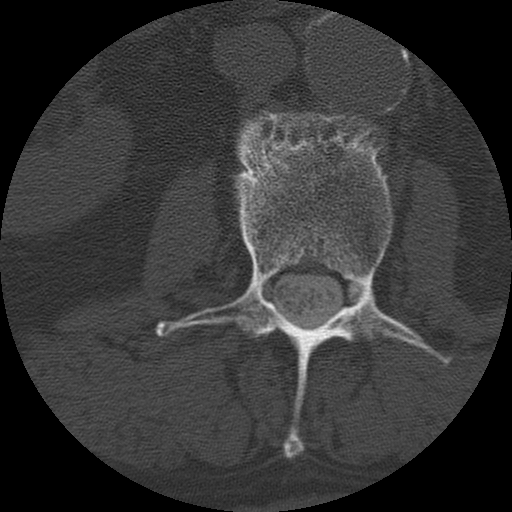

[Series 103: sag st · sagittal · 0.47mm/px · 5 of 44 slices shown]
[im 8/44  bone]
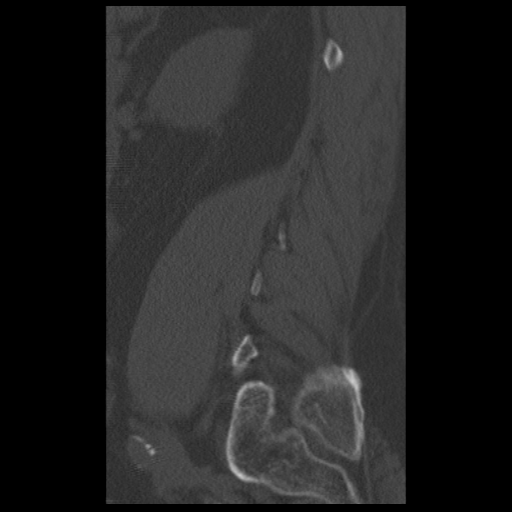
[im 15/44  bone]
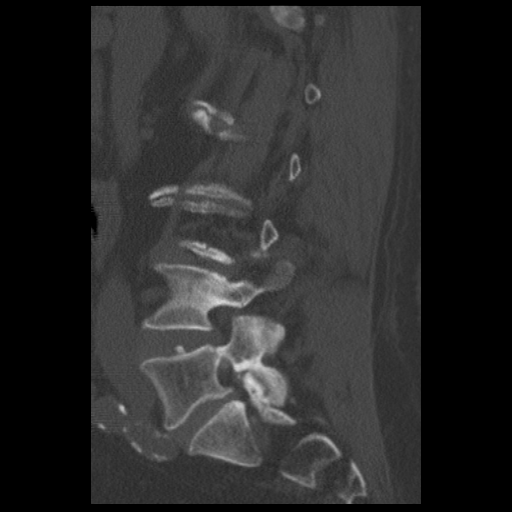
[im 22/44  bone]
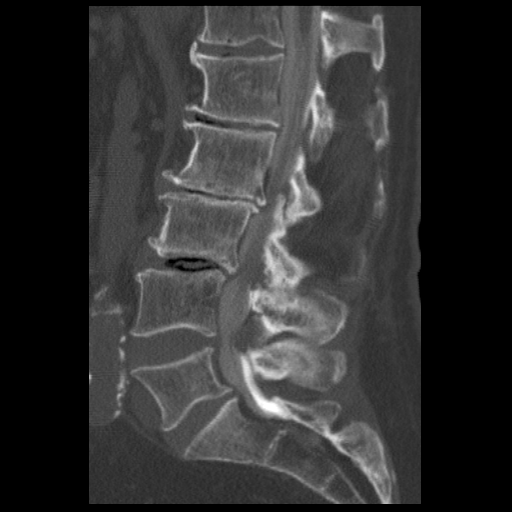
[im 29/44  bone]
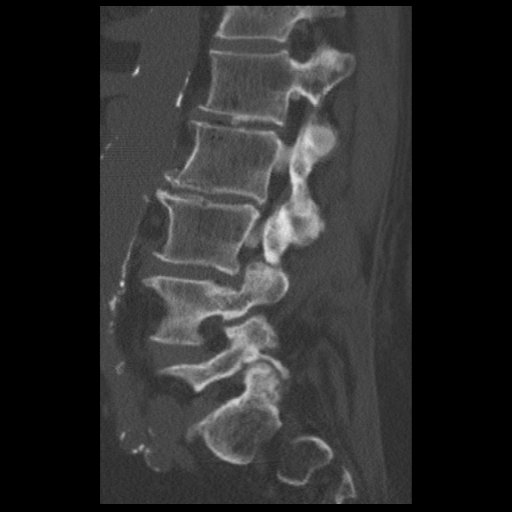
[im 36/44  bone]
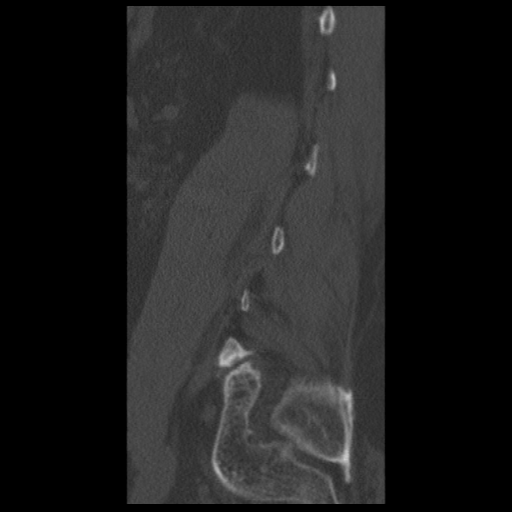

[Series 403: sag · sagittal · 0.47mm/px · 1 of 43 slices shown]
[im 22/43  bone]
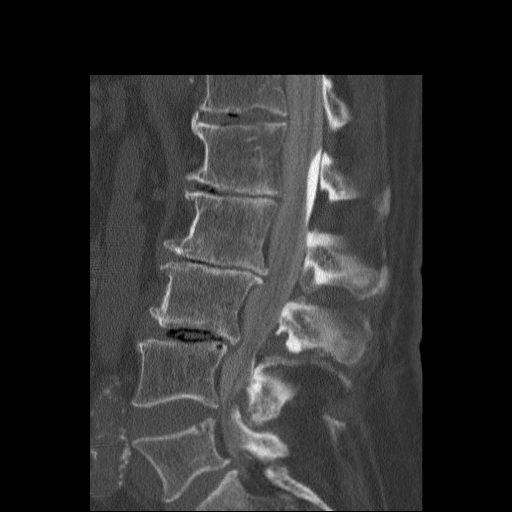

[10 of 34 positions shown; findings below may reference images not displayed]

FINDINGS: Levo rotatory scoliosis lumbar spine.  Circumferential
narrowing of the thecal sac at the L3-4 level with nerve root
compression.  Slightly less notable circumferential narrowing of
the thecal sac at the L4-5 level.  Ventral impression upon the
thecal sac L2-3 level.  Please see below.
IMPRESSION: Levo rotatory scoliosis of the lumbar spine with superimposed
degenerative changes contributing to spinal stenosis most prominent
L3-4 followed by the L4-5 level and slightly less so at the L2-3
level.  Please see below.

CT MYELOGRAPHY LUMBAR SPINE
FINDINGS: Status post abdominal aortic aneurysm repair with
endoluminal graft incompletely evaluated on the present exam.  Last
disc spaces labeled L5-S1.  Present examination incorporates from
mid T12 to the lower sacrum.  Levoscoliosis.  Conus upper L1 level.

T12-L1: Schmorl's node deformity.  Minimal bulge.

L1-2:  Moderate disc space narrowing.  Minimal bulge.  Osteophyte
greater anterior right lateral position.  No significant spinal
stenosis or foraminal narrowing.

L2-3:  Right greater than left disc space narrowing.  Right lateral
osteophyte.  Bilateral facet joint degenerative changes.
Bulge/osteophyte slightly greater right posterior lateral position.
Mild to moderate right-sided lateral recess stenosis and spinal
stenosis.  Mild left-sided lateral recess stenosis and spinal
stenosis.  Mild right-sided foraminal narrowing.

L3-4:  Moderate bilateral facet joint degenerative changes.
Rotation and retrolisthesis L3.  Disc degeneration with bulge/broad-
based osteophyte.  Ligament flavum hypertrophy.  Short pedicles.
Multifactorial marked spinal stenosis and bilateral lateral recess
stenosis.  Osteophyte right lateral position contributes to
moderate right foraminal narrowing and mass effect upon the exiting
right L3 nerve root.

L4-5:  Mild to moderate bilateral facet joint degenerative changes.
Ligament flavum hypertrophy.  Moderate bulge/broad-based
protrusion.  Moderate to marked spinal stenosis and lateral recess
stenosis slightly greater on the left.  Mild bilateral foraminal
narrowing.

L5-S1:  Moderate bilateral facet joint degenerative changes with
bony overgrowth slightly greater on the left.  Left greater right
mild lateral recess stenosis and spinal stenosis.  Mild right-sided
foraminal narrowing.  Mild to moderate left foraminal narrowing
with osteophyte slightly encroaching upon the course exiting left
L5 nerve root.
IMPRESSION: Levo rotatory scoliosis with superimposed degenerative changes
contribute to spinal stenosis and foraminal narrowing most
prominent at the L3-4 level followed by the L4-5 level as described
above.

## 2010-08-11 IMAGING — RF DG MYELOGRAM LUMBAR
12 series · 12 of 12 positions shown · non-contrast
Comparison: None.

CLINICAL DATA: Low back pain.

MYELOGRAM LUMBAR
TECHNIQUE: Contrast was instilled by Dr. Dr. Gurierrez at the L3-4
level.
Fluoroscopic time:  1.4 minutes.
TECHNIQUE: CT imaging of the lumbar spine was performed after
intrathecal contrast administration.  Multiplanar CT image
reconstructions were also generated.

[Series 1: run · 1 of 1 slices shown (1 of 12)]
[im 1/1]
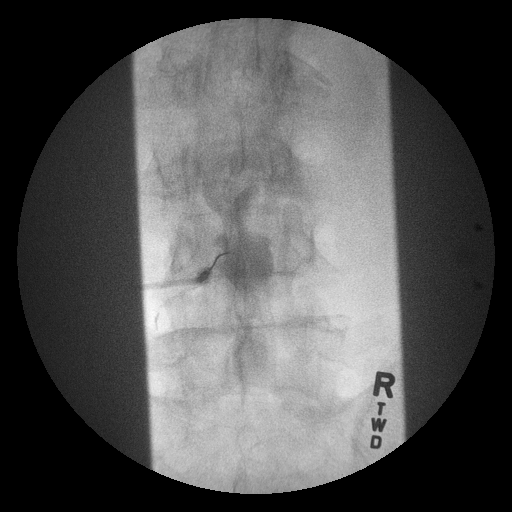

[Series 2: run · 1 of 1 slices shown (2 of 12)]
[im 1/1]
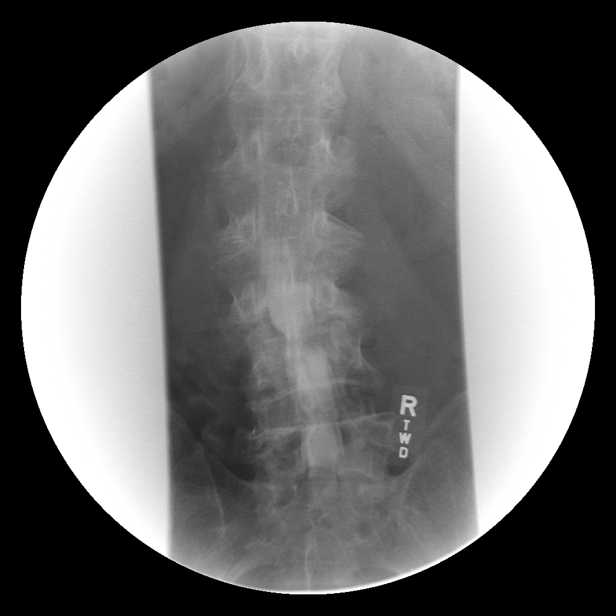

[Series 3: run · 1 of 1 slices shown (3 of 12)]
[im 1/1]
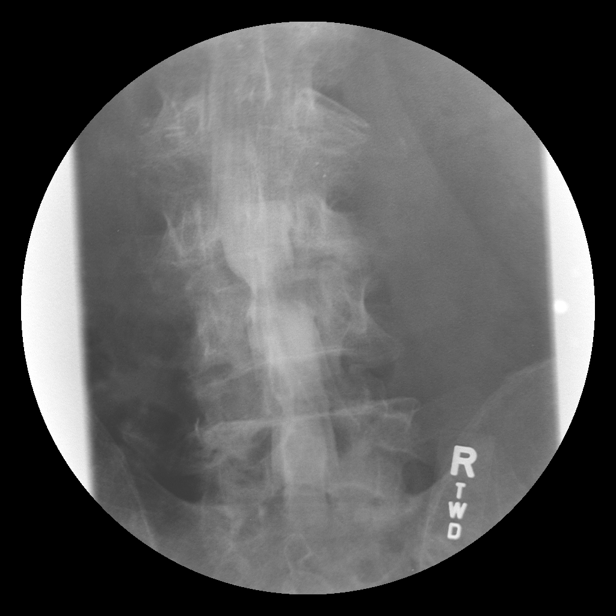

[Series 4: run · 1 of 1 slices shown (4 of 12)]
[im 1/1]
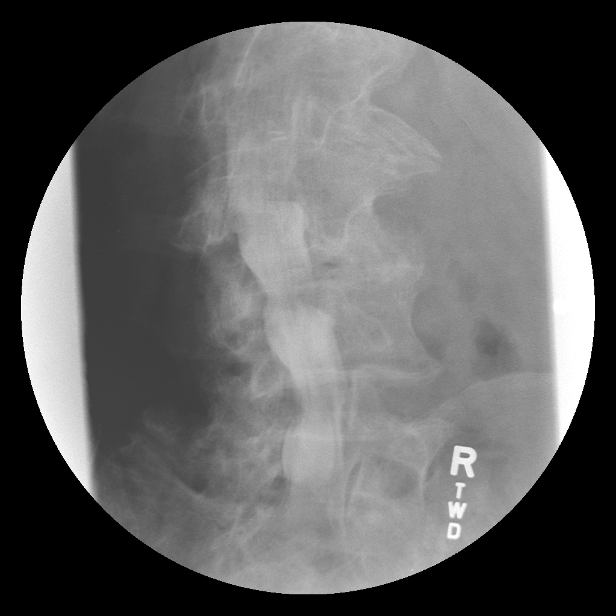

[Series 5: run · 1 of 1 slices shown (5 of 12)]
[im 1/1]
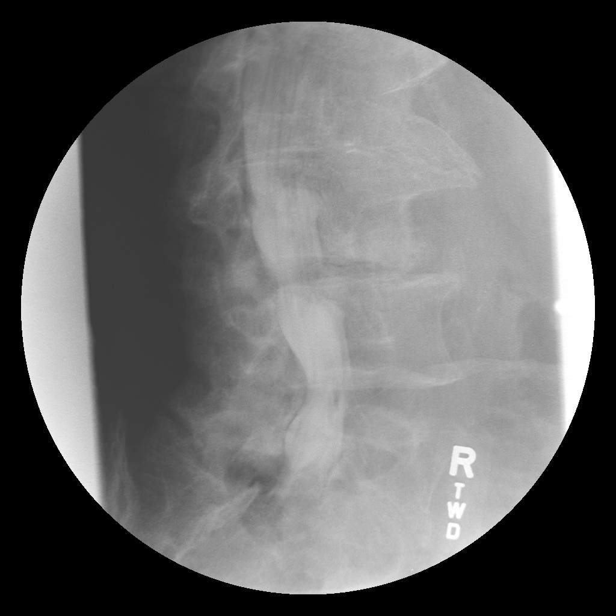

[Series 6: run · 1 of 1 slices shown (6 of 12)]
[im 1/1]
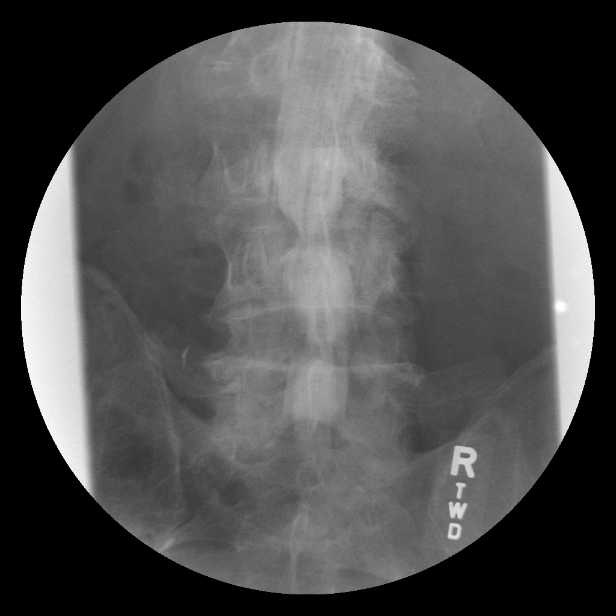

[Series 7: run · 1 of 1 slices shown (7 of 12)]
[im 1/1]
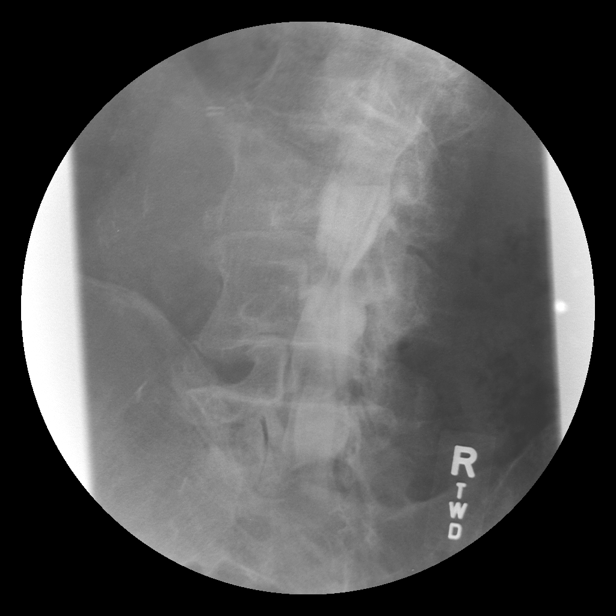

[Series 8: run · 1 of 1 slices shown (8 of 12)]
[im 1/1]
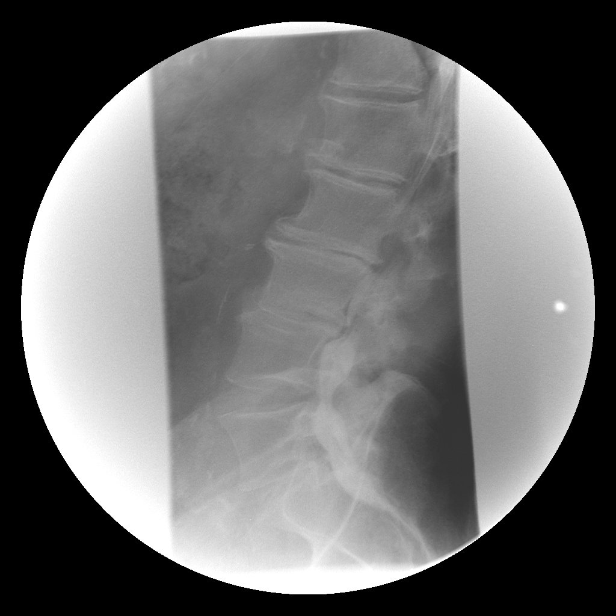

[Series 9: run · 1 of 1 slices shown (9 of 12)]
[im 1/1]
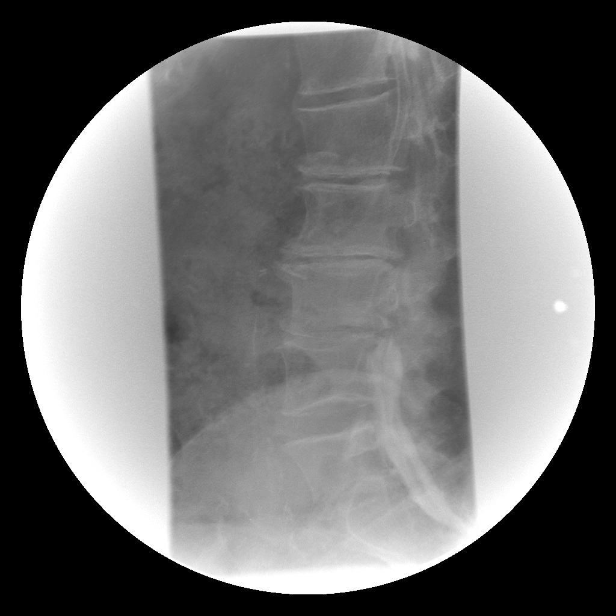

[Series 10: run · 1 of 1 slices shown (10 of 12)]
[im 1/1]
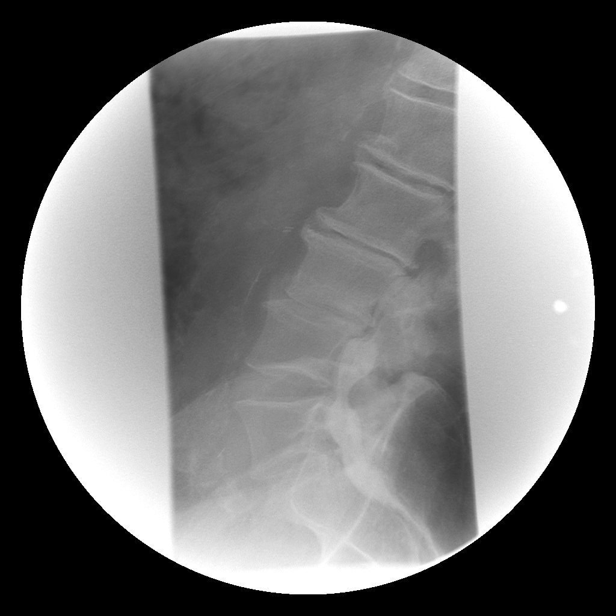

[Series 11: run · 1 of 1 slices shown (11 of 12)]
[im 1/1]
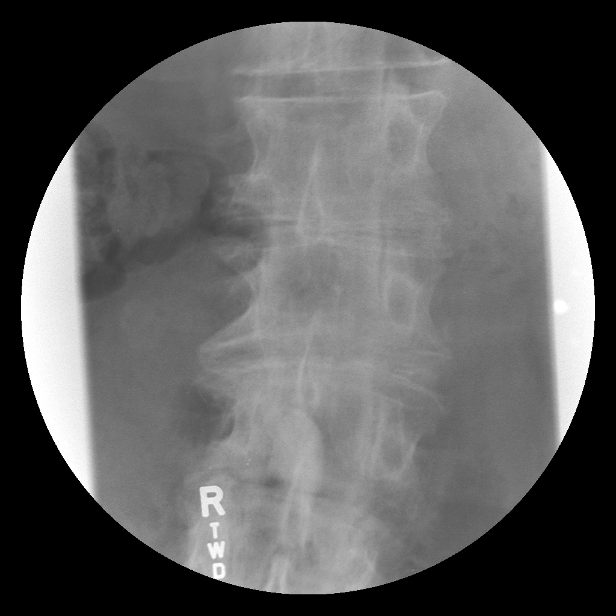

[Series 12: run · 1 of 1 slices shown (12 of 12)]
[im 1/1]
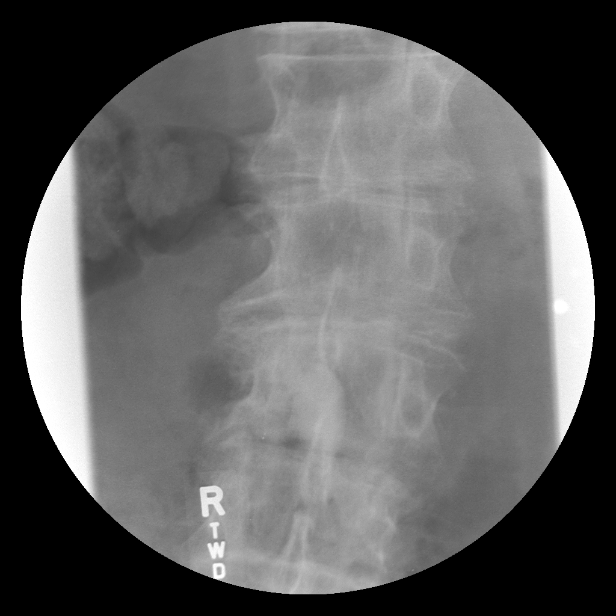

[12 of 12 positions shown; findings below may reference images not displayed]

FINDINGS: Levo rotatory scoliosis lumbar spine.  Circumferential
narrowing of the thecal sac at the L3-4 level with nerve root
compression.  Slightly less notable circumferential narrowing of
the thecal sac at the L4-5 level.  Ventral impression upon the
thecal sac L2-3 level.  Please see below.
IMPRESSION: Levo rotatory scoliosis of the lumbar spine with superimposed
degenerative changes contributing to spinal stenosis most prominent
L3-4 followed by the L4-5 level and slightly less so at the L2-3
level.  Please see below.

CT MYELOGRAPHY LUMBAR SPINE
FINDINGS: Status post abdominal aortic aneurysm repair with
endoluminal graft incompletely evaluated on the present exam.  Last
disc spaces labeled L5-S1.  Present examination incorporates from
mid T12 to the lower sacrum.  Levoscoliosis.  Conus upper L1 level.

T12-L1: Schmorl's node deformity.  Minimal bulge.

L1-2:  Moderate disc space narrowing.  Minimal bulge.  Osteophyte
greater anterior right lateral position.  No significant spinal
stenosis or foraminal narrowing.

L2-3:  Right greater than left disc space narrowing.  Right lateral
osteophyte.  Bilateral facet joint degenerative changes.
Bulge/osteophyte slightly greater right posterior lateral position.
Mild to moderate right-sided lateral recess stenosis and spinal
stenosis.  Mild left-sided lateral recess stenosis and spinal
stenosis.  Mild right-sided foraminal narrowing.

L3-4:  Moderate bilateral facet joint degenerative changes.
Rotation and retrolisthesis L3.  Disc degeneration with bulge/broad-
based osteophyte.  Ligament flavum hypertrophy.  Short pedicles.
Multifactorial marked spinal stenosis and bilateral lateral recess
stenosis.  Osteophyte right lateral position contributes to
moderate right foraminal narrowing and mass effect upon the exiting
right L3 nerve root.

L4-5:  Mild to moderate bilateral facet joint degenerative changes.
Ligament flavum hypertrophy.  Moderate bulge/broad-based
protrusion.  Moderate to marked spinal stenosis and lateral recess
stenosis slightly greater on the left.  Mild bilateral foraminal
narrowing.

L5-S1:  Moderate bilateral facet joint degenerative changes with
bony overgrowth slightly greater on the left.  Left greater right
mild lateral recess stenosis and spinal stenosis.  Mild right-sided
foraminal narrowing.  Mild to moderate left foraminal narrowing
with osteophyte slightly encroaching upon the course exiting left
L5 nerve root.
IMPRESSION: Levo rotatory scoliosis with superimposed degenerative changes
contribute to spinal stenosis and foraminal narrowing most
prominent at the L3-4 level followed by the L4-5 level as described
above.

## 2010-08-27 IMAGING — US US ABDOMEN COMPLETE
1 series · 13 of 25 positions shown · non-contrast
Comparison: CTs of the abdomen 04/24/2008 and earlier.

CLINICAL DATA: 67-year-old male with elevated liver function
tests.  Abdominal swelling, nausea, vomiting, and diarrhea.
History of AAA repair.

COMPLETE ABDOMINAL ULTRASOUND

[Series 1: us abdomen complete · 0.35mm/px · 13 of 79 slices shown]
[im 1/79]
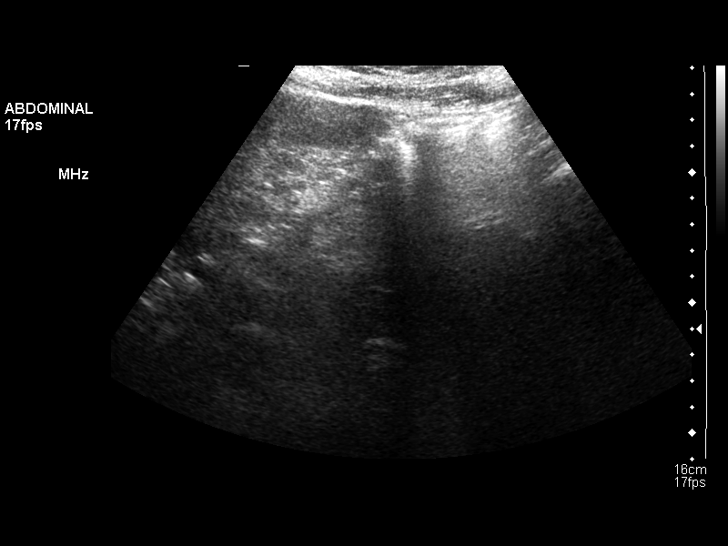
[im 7/79]
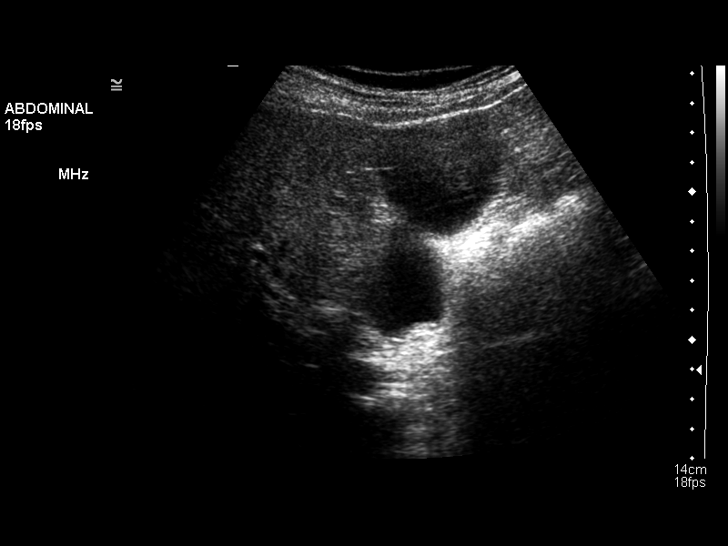
[im 14/79]
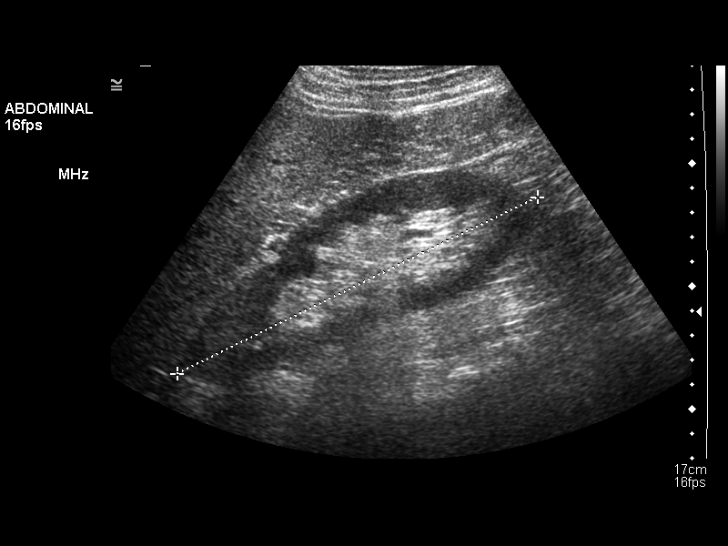
[im 20/79]
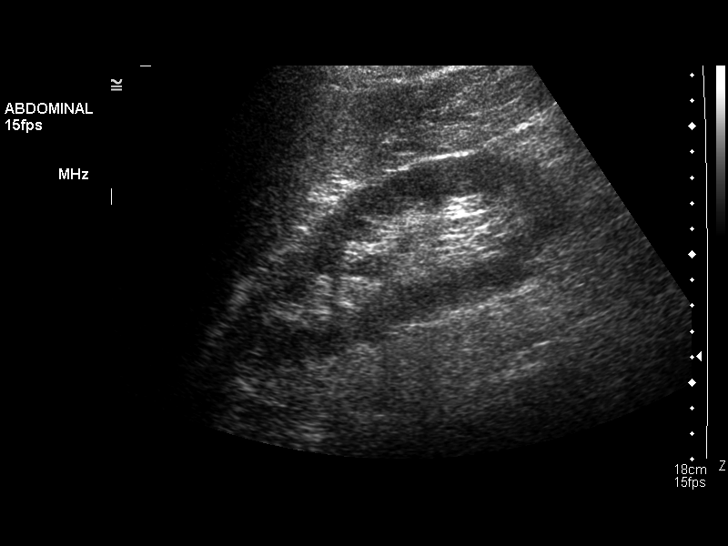
[im 27/79]
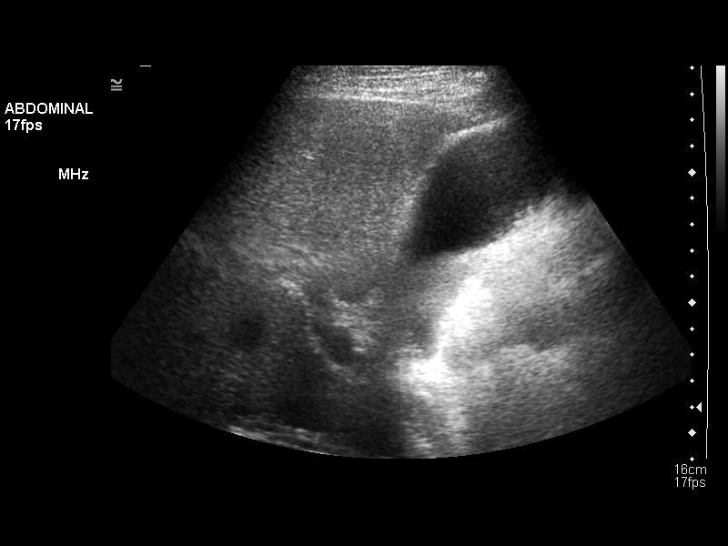
[im 33/79]
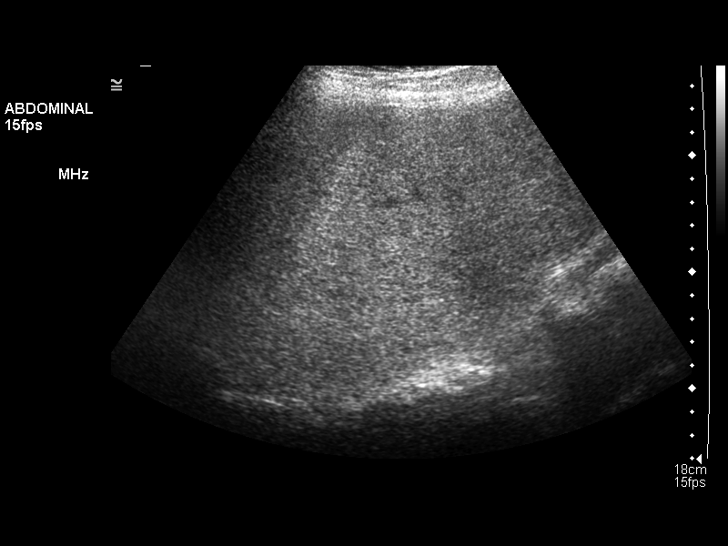
[im 40/79]
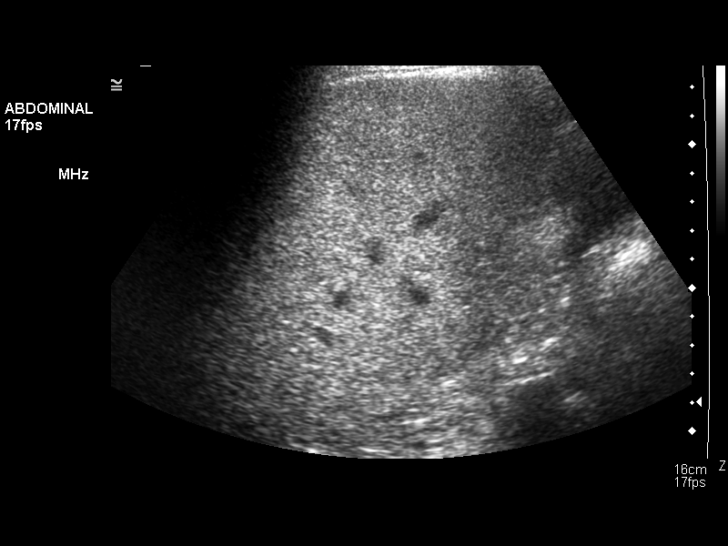
[im 46/79]
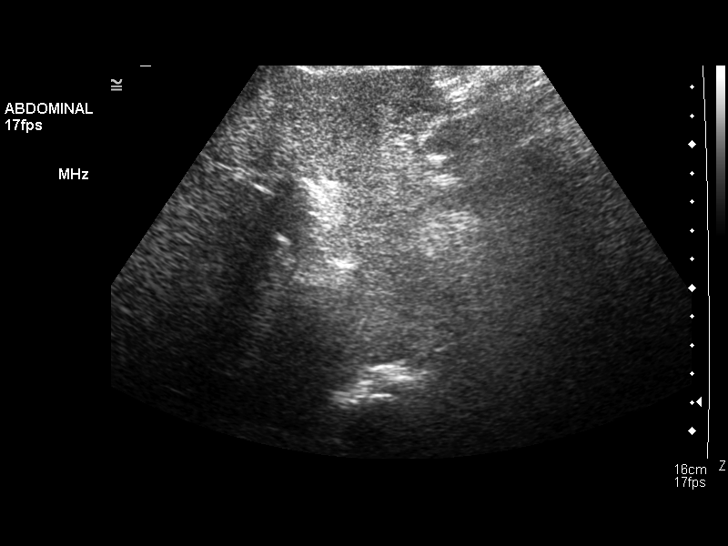
[im 53/79]
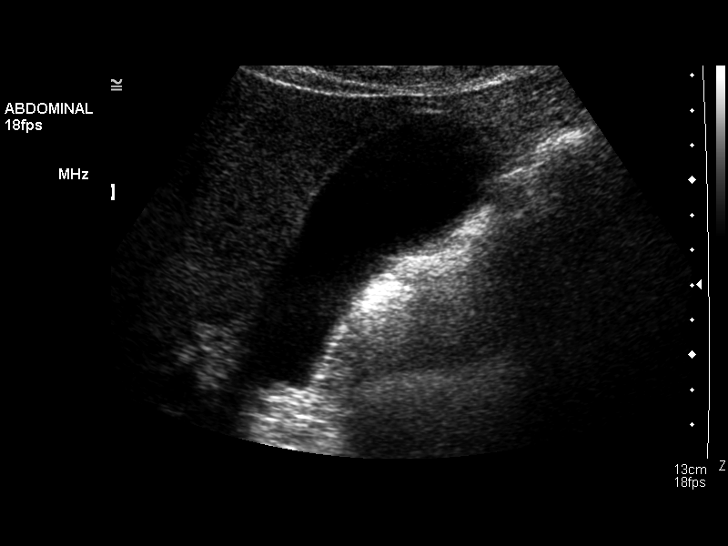
[im 59/79]
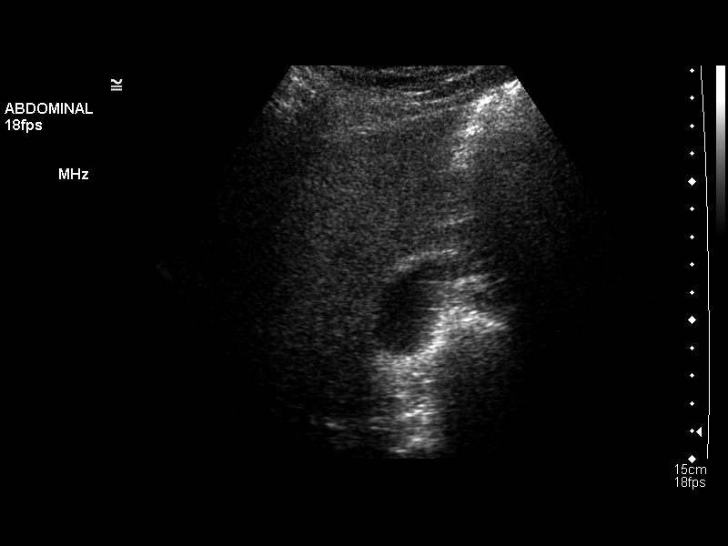
[im 66/79]
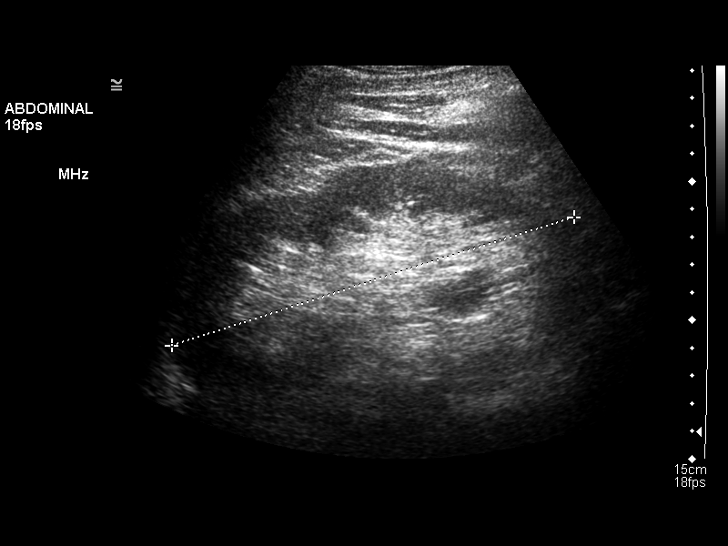
[im 72/79]
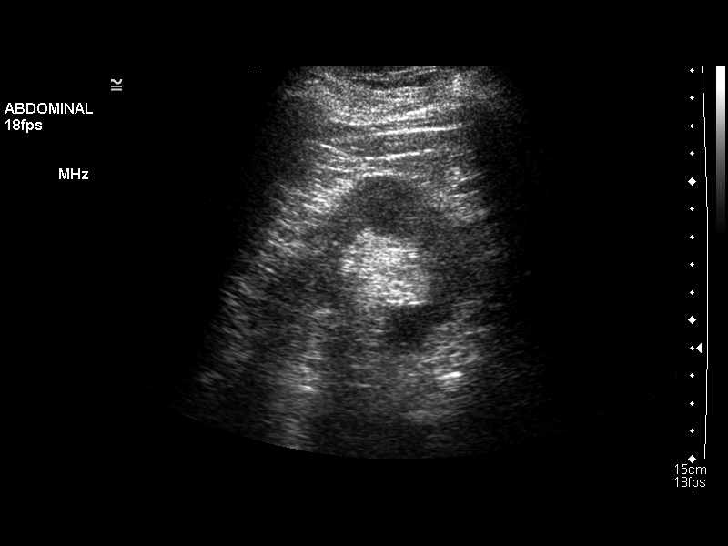
[im 79/79]
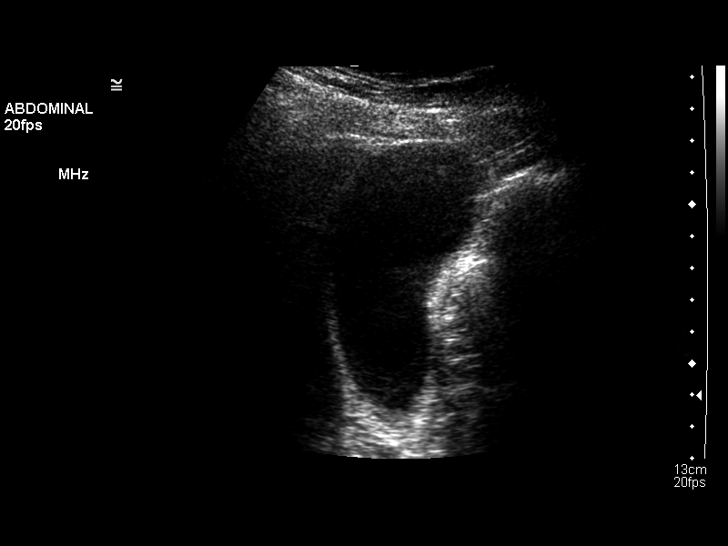

[13 of 25 positions shown; findings below may reference images not displayed]

FINDINGS: Gallbladder:  There is a focal, rounded area of echogenic material
in the gallbladder neck which does not shadow.  This is seen to be
somewhat mobile within the gallbladder neck, but did not to move
outside the neck region with changes in patient positioning.  No
sonographic Murphy's sign was elicited.  Gallbladder wall thickness
is at the upper limits of normal, 3 mm.  No shadowing echogenic
stones.  No pericholecystic fluid.

Common bile duct:  Normal at 6 mm.

Liver:  Mild diffuse increased echogenicity suggestive of a degree
of hepatic steatosis.  No focal liver lesion. No intrahepatic
ductal dilatation.

IVC:  Cannot be visualized despite multiple attempts due to
overlying bowel gas.

Pancreas:  Cannot be visualized despite multiple attempts due to
overlying bowel gas.

Spleen:  Normal measuring 6.5 cm in length.

Right Kidney:  Within normal limits measuring approximate 14 cm in
length on these images (approximate 13 cm on the previous CT).
Mild lobulation of the kidney appears stable.

Left Kidney:  Within normal limits measuring 13.9 cm in length on
these images (previously 14.5 centimeters on CT).  Stable mild
lobulation.  Of note, small nephrolithiasis previously seen in the
upper pole is not correlated on these images.

Abdominal aorta:  Cannot be visualized despite multiple attempts
due to overlying bowel gas.
IMPRESSION: 1.  Echogenic, somewhat mobile, but nonshadowing material in the
gallbladder neck.  Favor tumefactive sludge.  No shadowing stones
or evidence of acute cholecystitis.
2.  Nonvisualization of some abdominal viscera despite repeated
attempts due to overlying bowel gas.
3.  Mild hepatic steatosis.

## 2010-11-18 ENCOUNTER — Encounter: Payer: Self-pay | Admitting: Neurological Surgery

## 2010-11-25 ENCOUNTER — Observation Stay (HOSPITAL_COMMUNITY)
Admission: EM | Admit: 2010-11-25 | Discharge: 2010-11-27 | Payer: Self-pay | Source: Home / Self Care | Attending: Internal Medicine | Admitting: Internal Medicine

## 2010-11-25 LAB — DIFFERENTIAL
Basophils Absolute: 0 10*3/uL (ref 0.0–0.1)
Basophils Relative: 0 % (ref 0–1)
Eosinophils Absolute: 0 10*3/uL (ref 0.0–0.7)
Eosinophils Relative: 0 % (ref 0–5)
Lymphocytes Relative: 9 % — ABNORMAL LOW (ref 12–46)
Lymphs Abs: 0.6 10*3/uL — ABNORMAL LOW (ref 0.7–4.0)
Monocytes Absolute: 0.5 10*3/uL (ref 0.1–1.0)
Monocytes Relative: 7 % (ref 3–12)
Neutro Abs: 6.2 10*3/uL (ref 1.7–7.7)
Neutrophils Relative %: 85 % — ABNORMAL HIGH (ref 43–77)

## 2010-11-25 LAB — BASIC METABOLIC PANEL
BUN: 16 mg/dL (ref 6–23)
CO2: 25 mEq/L (ref 19–32)
Calcium: 9.8 mg/dL (ref 8.4–10.5)
Chloride: 102 mEq/L (ref 96–112)
Creatinine, Ser: 0.8 mg/dL (ref 0.4–1.5)
GFR calc Af Amer: >60 mL/min (ref >60)
GFR calc non Af Amer: >60 mL/min (ref >60)
Glucose, Bld: 154 mg/dL — ABNORMAL HIGH (ref 70–99)
Potassium: 3.9 mEq/L (ref 3.5–5.1)
Sodium: 136 mEq/L (ref 135–145)

## 2010-11-25 LAB — POCT CARDIAC MARKERS
CKMB, poc: 1.2 ng/mL (ref 1.0–8.0)
CKMB, poc: 2.6 ng/mL (ref 1.0–8.0)
Myoglobin, poc: 107 ng/mL (ref 12–200)
Myoglobin, poc: 91.7 ng/mL (ref 12–200)
Troponin i, poc: <0.05 ng/mL (ref 0.00–0.09)
Troponin i, poc: <0.05 ng/mL (ref 0.00–0.09)

## 2010-11-25 LAB — CBC
HCT: 40.5 % (ref 39.0–52.0)
Hemoglobin: 13.9 g/dL (ref 13.0–17.0)
MCH: 31.3 pg (ref 26.0–34.0)
MCHC: 34.3 g/dL (ref 30.0–36.0)
MCV: 91.2 fL (ref 78.0–100.0)
Platelets: 150 10*3/uL (ref 150–400)
RBC: 4.44 MIL/uL (ref 4.22–5.81)
RDW: 12.6 % (ref 11.5–15.5)
WBC: 7.3 10*3/uL (ref 4.0–10.5)

## 2010-11-25 LAB — PROTIME-INR
INR: 1.08 (ref 0.00–1.49)
Prothrombin Time: 14.2 seconds (ref 11.6–15.2)

## 2010-11-25 LAB — URINALYSIS, ROUTINE W REFLEX MICROSCOPIC
Bilirubin Urine: NEGATIVE
Hgb urine dipstick: NEGATIVE
Ketones, ur: NEGATIVE mg/dL
Nitrite: NEGATIVE
Protein, ur: NEGATIVE mg/dL
Specific Gravity, Urine: 1.02 (ref 1.005–1.030)
Urine Glucose, Fasting: NEGATIVE mg/dL
Urobilinogen, UA: 0.2 mg/dL (ref 0.0–1.0)
pH: 7 (ref 5.0–8.0)

## 2010-11-26 LAB — CBC
HCT: 39.2 % (ref 39.0–52.0)
Hemoglobin: 13.5 g/dL (ref 13.0–17.0)
MCH: 31.1 pg (ref 26.0–34.0)
MCHC: 34.4 g/dL (ref 30.0–36.0)
MCV: 90.3 fL (ref 78.0–100.0)
Platelets: 145 10*3/uL — ABNORMAL LOW (ref 150–400)
RBC: 4.34 MIL/uL (ref 4.22–5.81)
RDW: 12.6 % (ref 11.5–15.5)
WBC: 6.2 10*3/uL (ref 4.0–10.5)

## 2010-11-26 LAB — CARDIAC PANEL(CRET KIN+CKTOT+MB+TROPI)
CK, MB: 4.3 ng/mL — ABNORMAL HIGH (ref 0.3–4.0)
CK, MB: 2.9 ng/mL (ref 0.3–4.0)
Relative Index: 3 — ABNORMAL HIGH (ref 0.0–2.5)
Relative Index: 2.8 — ABNORMAL HIGH (ref 0.0–2.5)
Total CK: 141 U/L (ref 7–232)
Total CK: 102 U/L (ref 7–232)
Troponin I: 0.09 ng/mL — ABNORMAL HIGH (ref 0.00–0.06)
Troponin I: 0.1 ng/mL — ABNORMAL HIGH (ref 0.00–0.06)

## 2010-11-26 LAB — TROPONIN I: Troponin I: 0.1 ng/mL — ABNORMAL HIGH (ref 0.00–0.06)

## 2010-11-26 LAB — BASIC METABOLIC PANEL
BUN: 13 mg/dL (ref 6–23)
CO2: 27 mEq/L (ref 19–32)
Calcium: 9.6 mg/dL (ref 8.4–10.5)
Chloride: 103 mEq/L (ref 96–112)
Creatinine, Ser: 0.83 mg/dL (ref 0.4–1.5)
GFR calc Af Amer: >60 mL/min (ref >60)
GFR calc non Af Amer: >60 mL/min (ref >60)
Glucose, Bld: 121 mg/dL — ABNORMAL HIGH (ref 70–99)
Potassium: 4.1 mEq/L (ref 3.5–5.1)
Sodium: 140 mEq/L (ref 135–145)

## 2010-11-26 LAB — GLUCOSE, CAPILLARY
Glucose-Capillary: 112 mg/dL — ABNORMAL HIGH (ref 70–99)
Glucose-Capillary: 127 mg/dL — ABNORMAL HIGH (ref 70–99)
Glucose-Capillary: 123 mg/dL — ABNORMAL HIGH (ref 70–99)
Glucose-Capillary: 119 mg/dL — ABNORMAL HIGH (ref 70–99)
Glucose-Capillary: 148 mg/dL — ABNORMAL HIGH (ref 70–99)
Glucose-Capillary: 154 mg/dL — ABNORMAL HIGH (ref 70–99)

## 2010-11-26 LAB — LIPID PANEL
Cholesterol: 147 mg/dL (ref 0–200)
HDL: 29 mg/dL — ABNORMAL LOW (ref >39)
LDL Cholesterol: 92 mg/dL (ref 0–99)
Total CHOL/HDL Ratio: 5.1 RATIO
Triglycerides: 132 mg/dL (ref <150)
VLDL: 26 mg/dL (ref 0–40)

## 2010-11-26 LAB — CK TOTAL AND CKMB (NOT AT ARMC)
CK, MB: 5.4 ng/mL — ABNORMAL HIGH (ref 0.3–4.0)
Relative Index: 3.1 — ABNORMAL HIGH (ref 0.0–2.5)
Total CK: 177 U/L (ref 7–232)

## 2010-11-26 LAB — HEMOGLOBIN A1C
Hgb A1c MFr Bld: 6.3 % — ABNORMAL HIGH (ref <5.7)
Mean Plasma Glucose: 134 mg/dL — ABNORMAL HIGH (ref <117)

## 2010-11-26 LAB — BRAIN NATRIURETIC PEPTIDE: Pro B Natriuretic peptide (BNP): 118 pg/mL — ABNORMAL HIGH (ref 0.0–100.0)

## 2010-11-26 LAB — TSH: TSH: 0.569 u[IU]/mL (ref 0.350–4.500)

## 2010-11-27 LAB — BASIC METABOLIC PANEL
BUN: 10 mg/dL (ref 6–23)
CO2: 23 mEq/L (ref 19–32)
Calcium: 9.3 mg/dL (ref 8.4–10.5)
Chloride: 106 mEq/L (ref 96–112)
Creatinine, Ser: 0.75 mg/dL (ref 0.4–1.5)
GFR calc Af Amer: >60 mL/min (ref >60)
GFR calc non Af Amer: >60 mL/min (ref >60)
Glucose, Bld: 128 mg/dL — ABNORMAL HIGH (ref 70–99)
Potassium: 3.9 mEq/L (ref 3.5–5.1)
Sodium: 138 mEq/L (ref 135–145)

## 2010-11-27 LAB — CBC
HCT: 37.9 % — ABNORMAL LOW (ref 39.0–52.0)
Hemoglobin: 12.8 g/dL — ABNORMAL LOW (ref 13.0–17.0)
MCH: 31.2 pg (ref 26.0–34.0)
MCHC: 33.8 g/dL (ref 30.0–36.0)
MCV: 92.4 fL (ref 78.0–100.0)
Platelets: 127 10*3/uL — ABNORMAL LOW (ref 150–400)
RBC: 4.1 MIL/uL — ABNORMAL LOW (ref 4.22–5.81)
RDW: 13 % (ref 11.5–15.5)
WBC: 6.6 10*3/uL (ref 4.0–10.5)

## 2010-11-27 LAB — GLUCOSE, CAPILLARY
Glucose-Capillary: 135 mg/dL — ABNORMAL HIGH (ref 70–99)
Glucose-Capillary: 138 mg/dL — ABNORMAL HIGH (ref 70–99)
Glucose-Capillary: 137 mg/dL — ABNORMAL HIGH (ref 70–99)

## 2010-11-27 LAB — D-DIMER, QUANTITATIVE: D-Dimer, Quant: 1.1 ug/mL-FEU — ABNORMAL HIGH (ref 0.00–0.48)

## 2010-11-27 LAB — URINE CULTURE
Colony Count: NO GROWTH
Culture  Setup Time: 201201300118
Culture: NO GROWTH

## 2010-12-02 LAB — CULTURE, BLOOD (ROUTINE X 2)
Culture  Setup Time: 201201300834
Culture  Setup Time: 201201300834
Culture: NO GROWTH
Culture: NO GROWTH

## 2010-12-12 NOTE — Procedures (Signed)
[name], [name] NO.:  [id number]  MEDICAL RECORD NO.:  [id number]          PATIENT TYPE:  OBV  LOCATION:  2506                         FACILITY:  [facility]  PHYSICIAN:  Corky Crafts, MDDATE OF BIRTH:  [birth year]  DATE OF PROCEDURE: DATE OF DISCHARGE:                           CARDIAC CATHETERIZATION   PRIMARY CARDIOLOGIST:  Lyn Records, MD  PRIMARY CARE PHYSICIAN:  Thora Lance, MD  PROCEDURES PERFORMED: 1. Left heart catheterization. 2. Ventriculogram coronary angiogram.  OPERATOR:  Corky Crafts, MD  INDICATIONS:  Unstable angina.  PROCEDURE:  The risks and benefits of cardiac catheterization were explained to the patient.  Informed consent was obtained.  He was brought to the cath lab.  He was prepped and draped in usual sterile fashion.  His right wrist was infiltrated with 1% lidocaine.  A 5-French glide sheath was placed in the right radial artery using the modified Seldinger technique.  Verapamil was used as an antispasm medicine in the right radial artery.  Right coronary artery angiography was performed using a JR-4 pigtail catheter.  The catheter was advanced to the vessel ostium under fluoroscopic guidance.  Digital angiography was performed in multiple projections using hand injection of contrast.  Left coronary artery angiography was performed using a JL-3.5 catheter in a similar fashion.  Intracoronary nitroglycerin was administered during the procedure to treat any potential spasm in the circumflex system and LAD. More magnified view was obtained of areas of question in the mid LAD and in the midcircumflex.  A pigtail catheter was advanced to the ascending aorta and across the aortic valve under fluoroscopic guidance.  Power injection of contrast was performed in the RAO projection to image the left ventricle.  Catheter was pulled back under continuous hemodynamic pressure monitoring.  Catheter was then withdrawn  under continuous hemodynamic pressure monitoring.  The catheter was removed.  The sheath was removed using manual compression with a TR band.  FINDINGS: 1. The left main is short vessel but widely patent. 2. Left circumflex is widely patent proximally. 3. There is a large branching OM-1 which has a patent stent just     before the bifurcation.  The continuation of the circumflex has an     ostial 40-50% stenosis.  There is also a sharp bend at this point. 4. The left anterior descending is a large vessel.  There is calcium     in the tortuous portion of the proximal to mid vessel.  The prior     LAD stent appears patent.  There is a small first diagonal with     moderate diffuse proximal disease.  The second diagonal is small     and patent.  The mid-to-distal LAD is a large vessel which wraps     around the apex and feeds collaterals to the RCA distribution. 5. The RCA is occluded proximally as noted above.  There are left-to-     right collaterals from both the circumflex system and the LAD.  The     circumflex system feeds into the posterolateral artery and the LAD  feeds in to the PDA. 6. Left ventriculogram shows an estimated ejection fraction of 40%.     It was suboptimal in terms of ascertaining wall motion     abnormalities.  HEMODYNAMICS:  LV pressure 105/5 with an LVEDP of 15 mmHg.  Aortic pressure 104/56 with a mean aortic pressure of 74 mmHg.  IMPRESSION: 1. Patent left anterior descending coronary artery and left circumflex     stent. 2. 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 3. Mild-to-moderate decreased left ventricular function, estimated     ejection fraction of 40%.  RECOMMENDATIONS:  Unclear etiology of shortness of breath, it does not appear to be coming from any changes in his coronary artery anatomy. Check BNP.  He potentially could need some additional diuresis as this is elevated.     Corky Crafts, MD     JSV/MEDQ  D:   11/26/2010  T:  11/27/2010  Job:  [id number]  Electronically Signed by Lance Muss MD on 12/12/2010 09:47:33 AM

## 2010-12-12 NOTE — Consult Note (Signed)
[name], [name] NO.:  [id number]  MEDICAL RECORD NO.:  [id number]          PATIENT TYPE:  OBV  LOCATION:  2506                         FACILITY:  [facility]  PHYSICIAN:  Corky Crafts, MDDATE OF BIRTH:  [birth year]  DATE OF CONSULTATION:  11/26/2010 DATE OF DISCHARGE:                                CONSULTATION   REFERRING PHYSICIAN:  Thora Lance, MD.  PRIMARY CARDIOLOGIST:  Lyn Records, MD.  REASON FOR CONSULTATION:  Shortness of breath, chest tightness, and abnormal cardiac enzymes.  HISTORY OF PRESENT ILLNESS:  The patient is a 70 year old man who has a history of coronary artery disease.  He has had several interventions performed in the past.  He has bare metal stent in his LAD and left circumflex.  He has known occluded right coronary artery with left-to- right collaterals.  He came to the emergency room because of shortness of breath.  His symptoms started after an epidural injection.  He was also placed on tramadol and had one of the side effects was shortness of breath.  He thought, maybe his symptoms were coming from the combination of the injection and the tramadol.  He then developed a clammy feeling along with some nausea.  He has had some heartburn symptoms over the past few days.  The symptoms were somewhat similar to his prior heart attack, but not nearly as intense.  He eventually came to the emergency room, and workup revealed mildly increased troponin level. Currently, he says the nausea is gone and the clammy sensation is gone.  He has some very minimal discomfort in his chest.  PAST MEDICAL HISTORY: 1. Coronary artery disease. 2. Abdominal aortic aneurysm. 3. Type 2 diabetes. 4. Hypertension. 5. Sleep apnea. 6. Ischemic cardiomyopathy. 7. Degenerative joint disease. 8. Spinal stenosis. 9. Atrial fibrillation.  HOME MEDICATIONS: 1. Ramipril. 2. Aspirin. 3. Metformin. 4. Lyrica. 5. Metoprolol. 6. Lipitor. 7.  Prilosec. 8. Percocet. 9. Vitamin B12.  ALLERGIES:  No known drug allergies.  SURGERIES:  He has had AAA repair.  He also had his gallbladder removed last year.  He had a hernia repair.  SOCIAL HISTORY:  He does not smoke.  He lives with his wife.  He worked as a Production designer, theatre/television/film for a [employer].  FAMILY HISTORY:  Significant for coronary artery disease.  REVIEW OF SYSTEMS:  Significant for the nausea, discomfort in his chest, and shortness of breath as noted above.  No focal weakness.  No bleeding problems.  No leg swelling.  All other systems negative.  PHYSICAL EXAMINATION:  VITAL SIGNS:  Blood pressure 118/63, heart rate 87. GENERAL:  He is awake and alert, in no apparent distress. HEENT:  Head:  Normocephalic, atraumatic.  Eyes:  Extraocular movements intact. NECK:  No JVD. CARDIOVASCULAR:  Regular rate and rhythm, S1 and S2. LUNGS:  Clear to auscultation bilaterally. ABDOMEN:  Soft, nontender, nondistended. EXTREMITIES:  No edema, 3+ right radial pulse and 2+ right posterior tibial pulse. NEUROLOGIC:  No focal motor or sensory deficits. SKIN:  No rash. BACK:  No kyphosis.  LABORATORY DATA:  Initial troponin 0.1, second troponin  0.09.  Total CK 141.  Triglycerides 132, HDL 29, LDL 92, total cholesterol 956. Creatinine 0.83.  ECG shows normal sinus rhythm with occasional PVCs. No significant ST-T wave changes.  Chest x-ray showed likely atelectasis rather than pneumonia.  ASSESSMENT:  A 70 year old with known coronary artery disease who has had some symptoms consistent with unstable angina.  PLAN: 1. Cardiac.  Risks and benefits of cardiac catheterization were     explained to the patient, and informed consent was obtained.  He is     agreeable to a cardiac cath.  We will likely perform this     afternoon.  He has already eaten breakfast.  His symptoms are mild     at this time, and it is not clear that they are cardiac in nature.     If there will be a significant  delay in his catheterization, we     will start heparin. 2. Hypertension.  Blood pressure currently well controlled. 3. Lipids are well controlled.  His HDL is low, this would improve     with exercise. 4. Further plans based on results of catheterization.     Corky Crafts, MD     JSV/MEDQ  D:  11/26/2010  T:  11/27/2010  Job:  [id number]  Electronically Signed by Lance Muss MD on 12/12/2010 09:47:24 AM

## 2010-12-31 NOTE — H&P (Signed)
[name], BAIZ NO.:  [id number]  MEDICAL RECORD NO.:  [id number]          PATIENT TYPE:  OBV  LOCATION:  2506                         FACILITY:  [facility]  PHYSICIAN:  Della Goo, M.D. DATE OF BIRTH:  August 06, 1941  DATE OF ADMISSION:  11/25/2010 DATE OF DISCHARGE:                             HISTORY & PHYSICAL   PRIMARY CARE PHYSICIAN:  Dr. Kirby Funk.  CHIEF COMPLAINT:  Shortness of breath and chest tightness.  HISTORY OF PRESENT ILLNESS:  This is a 70 year old male who presents to the emergency department with complaints of shortness of breath and chest tightness since the afternoon.  He states he has also been having indigestion and dyspepsia for the past 3 days and has been taking a lot of Tums.  The patient states that he has not right for the past 5 days since he had an epidural injection into his back.  He has been taking tramadol for pain and has had increasing shortness of breath.  He stated that he read that shortness of breath could be a side effect of his tramadol.  He stated that the episodes on the afternoon of admission was worse.  He denies having any cough, fevers or chills.  He stated that he was with his brother-in-law this afternoon as well who had to have EMS called, and there was a lot of excitement.  He stated he began to have a feel clammy and diaphoretic, and he also asks EMS to take a look at him, and they did do an EKG and [name] was advised by the paramedics to go to the hospital as well.  PAST MEDICAL HISTORY:  Significant for coronary artery disease status post PTCA with stent placement x2, hypertension, type 2 diabetes mellitus, obstructive sleep apnea, diverticulosis, ischemic cardiomyopathy, dyslipidemia, status post AAA repair, status post degenerative joint disease, and spinal stenosis.  MEDICATIONS:  Ramipril, Actos, metoprolol tartrate, metformin, Lyrica, Lipitor, coenzyme Q10, aspirin, omeprazole, Percocet,  vitamin B12, tramadol, and he has CPAP nightly at 7 cm.  ALLERGIES:  No known drug allergies.  SOCIAL HISTORY:  The patient is a nonsmoker.  He reports rare alcohol usage.  He denies any illicit drug usage.  FAMILY HISTORY:  Positive for coronary artery disease and diabetic disease in both parents, also the patient has a sister with coronary artery disease as well.  There is no history of cancer in his family that he knows of.  REVIEW OF SYSTEMS:  Pertinent mentioned above in the HPI.  All other organ systems are negative.  PHYSICAL EXAMINATION FINDINGS:  GENERAL:  This is a 70 year old well- nourished, well-developed Caucasian male in no visible discomfort or acute distress but mildly anxious. VITAL SIGNS:  Temperature 99.8, blood pressure 133/75, heart rate 69, and respirations 18, O2 sats 97-99%. HEENT:  Normocephalic and atraumatic.  Pupils are equally round reactive to light.  Extraocular movements are intact.  Funduscopic benign.  There is no scleral icterus.  Nares are patent bilaterally.  Oropharynx clear. NECK:  Supple full range of motion.  No thyromegaly, adenopathy, jugular venous distention. CARDIOVASCULAR:  Regular rate and rhythm.  No murmurs, gallops, or rubs. LUNGS:  Clear to auscultation bilaterally.  No rales, rhonchi, or wheezes appreciated. ABDOMEN:  Positive bowel sounds, soft, nontender, and nondistended.  No hepatosplenomegaly.  No rebound.  No guarding. EXTREMITIES:  Without cyanosis, clubbing, or edema. NEUROLOGIC:  The patient is alert and oriented x3.  There are no focal deficits.  LABORATORY STUDIES:  White blood cell count 7.3, hemoglobin 13.9, hematocrit 40.5, MCV 91.2, platelets 150, neutrophils 85%, lymphocytes 9%.  Protime 14.2, INR of 1.08.  Sodium 136, potassium 3.9, chloride 102, carbon dioxide of 25, BUN 16, creatinine 0.80, and glucose 154. Urinalysis negative.  Point-of-care cardiac markers with a myoglobin of 107, CK-MB 2.6, troponin  less than 0.05.  Second set of point-of-care markers with myoglobin of 91.7, CK-MB 1.2 and troponin less than 0.05. Chest x-ray reveals a linear density posterior to the heart shadow on the lateral view.  Of the frontal view was noted as having a retrocardiac in left basilar region adjacent to the cardiac apex most compatible with some subsegmental abscess formation.  EKG reveals normal sinus rhythm at a rate of 61, nonspecific ST-segment changes were seen.  ASSESSMENT:  A 70 year old male being admitted with: 1. Chest pain. 2. Hypertension. 3. Type 2 diabetes mellitus. 4. Gastroesophageal reflux disease. 5. Pneumonia versus atelectasis.  PLAN:  The patient will be admitted to 23 hour observation.  Cardiac enzymes will be performed, and the patient will be monitored for further changes.  The patient will be placed on nitro based therapy, oxygen and aspirin therapy and his regular medications will be further verified and reconciled.  Sliding scale insulin coverage has been ordered and code status is full code at this time.  The patient will be placed on Avelox antibiotic therapy for now and a D-dimer will be ordered.  The patient is a full code at this time.     Della Goo, M.D.     HJ/MEDQ  D:  11/26/2010  T:  11/26/2010  Job:  [id number]  cc:   Thora Lance, M.D.  Electronically Signed by Della Goo M.D. on 12/31/2010 08:08:05 PM

## 2011-01-14 LAB — DIFFERENTIAL
Basophils Absolute: 0 10*3/uL (ref 0.0–0.1)
Basophils Relative: 0 % (ref 0–1)
Eosinophils Absolute: 0 10*3/uL (ref 0.0–0.7)
Eosinophils Relative: 1 % (ref 0–5)
Lymphocytes Relative: 19 % (ref 12–46)
Lymphs Abs: 0.8 10*3/uL (ref 0.7–4.0)
Monocytes Absolute: 0.5 10*3/uL (ref 0.1–1.0)
Monocytes Relative: 12 % (ref 3–12)
Neutro Abs: 3.1 10*3/uL (ref 1.7–7.7)
Neutrophils Relative %: 69 % (ref 43–77)

## 2011-01-14 LAB — COMPREHENSIVE METABOLIC PANEL
ALT: 28 U/L (ref 0–53)
AST: 25 U/L (ref 0–37)
Albumin: 3.9 g/dL (ref 3.5–5.2)
Alkaline Phosphatase: 58 U/L (ref 39–117)
BUN: 13 mg/dL (ref 6–23)
CO2: 29 mEq/L (ref 19–32)
Calcium: 10 mg/dL (ref 8.4–10.5)
Chloride: 101 mEq/L (ref 96–112)
Creatinine, Ser: 0.75 mg/dL (ref 0.4–1.5)
GFR calc Af Amer: >60 mL/min (ref >60)
GFR calc non Af Amer: >60 mL/min (ref >60)
Glucose, Bld: 210 mg/dL — ABNORMAL HIGH (ref 70–99)
Potassium: 4.9 mEq/L (ref 3.5–5.1)
Sodium: 138 mEq/L (ref 135–145)
Total Bilirubin: 0.8 mg/dL (ref 0.3–1.2)
Total Protein: 6.9 g/dL (ref 6.0–8.3)

## 2011-01-14 LAB — GLUCOSE, CAPILLARY
Glucose-Capillary: 120 mg/dL — ABNORMAL HIGH (ref 70–99)
Glucose-Capillary: 129 mg/dL — ABNORMAL HIGH (ref 70–99)
Glucose-Capillary: 139 mg/dL — ABNORMAL HIGH (ref 70–99)
Glucose-Capillary: 140 mg/dL — ABNORMAL HIGH (ref 70–99)
Glucose-Capillary: 154 mg/dL — ABNORMAL HIGH (ref 70–99)
Glucose-Capillary: 160 mg/dL — ABNORMAL HIGH (ref 70–99)

## 2011-01-14 LAB — CBC
HCT: 39.8 % (ref 39.0–52.0)
Hemoglobin: 13.5 g/dL (ref 13.0–17.0)
MCHC: 34 g/dL (ref 30.0–36.0)
MCV: 94.5 fL (ref 78.0–100.0)
Platelets: 147 10*3/uL — ABNORMAL LOW (ref 150–400)
RBC: 4.22 MIL/uL (ref 4.22–5.81)
RDW: 13.3 % (ref 11.5–15.5)
WBC: 4.5 10*3/uL (ref 4.0–10.5)

## 2011-01-30 LAB — COMPREHENSIVE METABOLIC PANEL
ALT: 26 U/L (ref 0–53)
ALT: 28 U/L (ref 0–53)
AST: 24 U/L (ref 0–37)
AST: 28 U/L (ref 0–37)
Albumin: 3.3 g/dL — ABNORMAL LOW (ref 3.5–5.2)
Albumin: 3.9 g/dL (ref 3.5–5.2)
Alkaline Phosphatase: 46 U/L (ref 39–117)
Alkaline Phosphatase: 51 U/L (ref 39–117)
BUN: 13 mg/dL (ref 6–23)
BUN: 20 mg/dL (ref 6–23)
CO2: 27 mEq/L (ref 19–32)
CO2: 29 mEq/L (ref 19–32)
Calcium: 8.5 mg/dL (ref 8.4–10.5)
Calcium: 9.2 mg/dL (ref 8.4–10.5)
Chloride: 104 mEq/L (ref 96–112)
Chloride: 109 mEq/L (ref 96–112)
Creatinine, Ser: 0.73 mg/dL (ref 0.4–1.5)
Creatinine, Ser: 0.84 mg/dL (ref 0.4–1.5)
GFR calc Af Amer: >60 mL/min (ref >60)
GFR calc Af Amer: >60 mL/min (ref >60)
GFR calc non Af Amer: >60 mL/min (ref >60)
GFR calc non Af Amer: >60 mL/min (ref >60)
Glucose, Bld: 122 mg/dL — ABNORMAL HIGH (ref 70–99)
Glucose, Bld: 163 mg/dL — ABNORMAL HIGH (ref 70–99)
Potassium: 3.9 mEq/L (ref 3.5–5.1)
Potassium: 4 mEq/L (ref 3.5–5.1)
Sodium: 138 mEq/L (ref 135–145)
Sodium: 141 mEq/L (ref 135–145)
Total Bilirubin: 0.9 mg/dL (ref 0.3–1.2)
Total Bilirubin: 0.9 mg/dL (ref 0.3–1.2)
Total Protein: 5.8 g/dL — ABNORMAL LOW (ref 6.0–8.3)
Total Protein: 6.6 g/dL (ref 6.0–8.3)

## 2011-01-30 LAB — GLUCOSE, CAPILLARY
Glucose-Capillary: 125 mg/dL — ABNORMAL HIGH (ref 70–99)
Glucose-Capillary: 135 mg/dL — ABNORMAL HIGH (ref 70–99)
Glucose-Capillary: 137 mg/dL — ABNORMAL HIGH (ref 70–99)
Glucose-Capillary: 138 mg/dL — ABNORMAL HIGH (ref 70–99)
Glucose-Capillary: 141 mg/dL — ABNORMAL HIGH (ref 70–99)
Glucose-Capillary: 142 mg/dL — ABNORMAL HIGH (ref 70–99)
Glucose-Capillary: 147 mg/dL — ABNORMAL HIGH (ref 70–99)
Glucose-Capillary: 152 mg/dL — ABNORMAL HIGH (ref 70–99)
Glucose-Capillary: 155 mg/dL — ABNORMAL HIGH (ref 70–99)
Glucose-Capillary: 156 mg/dL — ABNORMAL HIGH (ref 70–99)
Glucose-Capillary: 160 mg/dL — ABNORMAL HIGH (ref 70–99)

## 2011-01-30 LAB — CBC
HCT: 33.7 % — ABNORMAL LOW (ref 39.0–52.0)
HCT: 35.3 % — ABNORMAL LOW (ref 39.0–52.0)
HCT: 35.7 % — ABNORMAL LOW (ref 39.0–52.0)
HCT: 38.5 % — ABNORMAL LOW (ref 39.0–52.0)
Hemoglobin: 11.9 g/dL — ABNORMAL LOW (ref 13.0–17.0)
Hemoglobin: 12.5 g/dL — ABNORMAL LOW (ref 13.0–17.0)
Hemoglobin: 12.7 g/dL — ABNORMAL LOW (ref 13.0–17.0)
Hemoglobin: 13.6 g/dL (ref 13.0–17.0)
MCHC: 35.2 g/dL (ref 30.0–36.0)
MCHC: 35.3 g/dL (ref 30.0–36.0)
MCHC: 35.4 g/dL (ref 30.0–36.0)
MCHC: 35.6 g/dL (ref 30.0–36.0)
MCV: 94.7 fL (ref 78.0–100.0)
MCV: 95.1 fL (ref 78.0–100.0)
MCV: 95.4 fL (ref 78.0–100.0)
MCV: 95.6 fL (ref 78.0–100.0)
Platelets: 104 10*3/uL — ABNORMAL LOW (ref 150–400)
Platelets: 107 10*3/uL — ABNORMAL LOW (ref 150–400)
Platelets: 111 10*3/uL — ABNORMAL LOW (ref 150–400)
Platelets: 121 10*3/uL — ABNORMAL LOW (ref 150–400)
RBC: 3.53 MIL/uL — ABNORMAL LOW (ref 4.22–5.81)
RBC: 3.72 MIL/uL — ABNORMAL LOW (ref 4.22–5.81)
RBC: 3.75 MIL/uL — ABNORMAL LOW (ref 4.22–5.81)
RBC: 4.07 MIL/uL — ABNORMAL LOW (ref 4.22–5.81)
RDW: 13.1 % (ref 11.5–15.5)
RDW: 13.1 % (ref 11.5–15.5)
RDW: 13.3 % (ref 11.5–15.5)
RDW: 13.3 % (ref 11.5–15.5)
WBC: 4.2 10*3/uL (ref 4.0–10.5)
WBC: 5 10*3/uL (ref 4.0–10.5)
WBC: 5.4 10*3/uL (ref 4.0–10.5)
WBC: 8 10*3/uL (ref 4.0–10.5)

## 2011-01-30 LAB — DIFFERENTIAL
Basophils Absolute: 0 10*3/uL (ref 0.0–0.1)
Basophils Relative: 0 % (ref 0–1)
Eosinophils Absolute: 0 10*3/uL (ref 0.0–0.7)
Eosinophils Relative: 0 % (ref 0–5)
Lymphocytes Relative: 4 % — ABNORMAL LOW (ref 12–46)
Lymphs Abs: 0.3 10*3/uL — ABNORMAL LOW (ref 0.7–4.0)
Monocytes Absolute: 0.4 10*3/uL (ref 0.1–1.0)
Monocytes Relative: 5 % (ref 3–12)
Neutro Abs: 7.3 10*3/uL (ref 1.7–7.7)
Neutrophils Relative %: 91 % — ABNORMAL HIGH (ref 43–77)

## 2011-01-30 LAB — BASIC METABOLIC PANEL
BUN: 7 mg/dL (ref 6–23)
CO2: 29 mEq/L (ref 19–32)
Calcium: 9 mg/dL (ref 8.4–10.5)
Chloride: 113 mEq/L — ABNORMAL HIGH (ref 96–112)
Creatinine, Ser: 0.72 mg/dL (ref 0.4–1.5)
GFR calc Af Amer: >60 mL/min (ref >60)
GFR calc non Af Amer: >60 mL/min (ref >60)
Glucose, Bld: 161 mg/dL — ABNORMAL HIGH (ref 70–99)
Potassium: 4.2 mEq/L (ref 3.5–5.1)
Sodium: 145 mEq/L (ref 135–145)

## 2011-01-30 LAB — MAGNESIUM: Magnesium: 1.6 mg/dL (ref 1.5–2.5)

## 2011-01-30 LAB — CK TOTAL AND CKMB (NOT AT ARMC)
CK, MB: 4.6 ng/mL — ABNORMAL HIGH (ref 0.3–4.0)
Relative Index: 2.1 (ref 0.0–2.5)
Total CK: 222 U/L (ref 7–232)

## 2011-01-30 LAB — CARDIAC PANEL(CRET KIN+CKTOT+MB+TROPI)
CK, MB: 1.2 ng/mL (ref 0.3–4.0)
CK, MB: 3.1 ng/mL (ref 0.3–4.0)
Relative Index: 1.1 (ref 0.0–2.5)
Relative Index: 2 (ref 0.0–2.5)
Total CK: 109 U/L (ref 7–232)
Total CK: 156 U/L (ref 7–232)
Troponin I: 0.11 ng/mL — ABNORMAL HIGH (ref 0.00–0.06)
Troponin I: 0.13 ng/mL — ABNORMAL HIGH (ref 0.00–0.06)

## 2011-01-30 LAB — POCT CARDIAC MARKERS
CKMB, poc: 3.9 ng/mL (ref 1.0–8.0)
CKMB, poc: 4.2 ng/mL (ref 1.0–8.0)
Myoglobin, poc: 138 ng/mL (ref 12–200)
Myoglobin, poc: 142 ng/mL (ref 12–200)
Troponin i, poc: <0.05 ng/mL (ref 0.00–0.09)
Troponin i, poc: <0.05 ng/mL (ref 0.00–0.09)

## 2011-01-30 LAB — URINALYSIS, ROUTINE W REFLEX MICROSCOPIC
Bilirubin Urine: NEGATIVE
Glucose, UA: NEGATIVE mg/dL
Hgb urine dipstick: NEGATIVE
Ketones, ur: NEGATIVE mg/dL
Nitrite: NEGATIVE
Protein, ur: NEGATIVE mg/dL
Specific Gravity, Urine: 1.01 (ref 1.005–1.030)
Urobilinogen, UA: 1 mg/dL (ref 0.0–1.0)
pH: 6.5 (ref 5.0–8.0)

## 2011-01-30 LAB — HEMOCCULT GUIAC POC 1CARD (OFFICE): Fecal Occult Bld: NEGATIVE

## 2011-01-30 LAB — HEMOGLOBIN A1C
Hgb A1c MFr Bld: 6.4 % — ABNORMAL HIGH (ref 4.6–6.1)
Mean Plasma Glucose: 137 mg/dL

## 2011-01-30 LAB — CULTURE, BLOOD (ROUTINE X 2)
Culture: NO GROWTH
Culture: NO GROWTH

## 2011-01-30 LAB — TROPONIN I: Troponin I: 0.1 ng/mL — ABNORMAL HIGH (ref 0.00–0.06)

## 2011-01-30 LAB — TSH: TSH: 0.284 u[IU]/mL — ABNORMAL LOW (ref 0.350–4.500)

## 2011-01-30 LAB — MRSA PCR SCREENING: MRSA by PCR: NEGATIVE

## 2011-01-30 LAB — BRAIN NATRIURETIC PEPTIDE: Pro B Natriuretic peptide (BNP): 388 pg/mL — ABNORMAL HIGH (ref 0.0–100.0)

## 2011-01-30 LAB — T4, FREE: Free T4: 1.08 ng/dL (ref 0.80–1.80)

## 2011-02-07 LAB — GLUCOSE, CAPILLARY
Glucose-Capillary: 144 mg/dL — ABNORMAL HIGH (ref 70–99)
Glucose-Capillary: 185 mg/dL — ABNORMAL HIGH (ref 70–99)

## 2011-03-12 NOTE — Consult Note (Signed)
NEW PATIENT CONSULTATION   KODEN, HUNZEKER  DOB:  [birth year]                                       12/23/2007  EAVWU#:[id number]   Claudie Rathbone presents today for evaluation and discussion regarding a  recent finding of abdominal aortic aneurysm.  He is a 70 year old  gentleman who has had 3 recent bouts of severe nausea and vomiting.  This required admission to the hospital at one point in early February  of this year.  So far no clear-cut etiology has been ascertained after  GI workup and even cardiac workup.  He had myocardial infarction in the  past and this presented as nausea.  He underwent recent cardiac  catheterization by Dr. Garnette Scheuermann and was told that he did not have any  critical coronary disease.  During his evaluation for nausea and  vomiting he underwent CAT scan of his abdomen and this revealed an  incidental finding of infrarenal abdominal aortic aneurysm.  The date of  the CT scan was 12/04/2007.  His maximal diameter was 5.4 cm.  He does a  have common iliac artery aneurysms bilaterally at 1.8 mm on the right  and 2.3 mm on the left.  He does not have any history of abdominal  aortic aneurysm in his family and had no prior knowledge of this.   PAST HISTORY:  Significant for:  1. Noninsulin dependent diabetes.  2. Hyperlipidemia.  3. Obstructive sleep apnea on CPAP.  4. He does have a history of spinal stenosis with lumbar diskectomy in      2003.  5. He does have history of esophageal reflux.   MEDICATIONS:  1. Altace 10 mg p.o. daily.  2. Nexium 40 mg p.o. daily.  3. Metoprolol 50 mg, 1/2 b.i.d.  4. Lipitor 10 mg daily.  5. Actos 30 mg daily.  6. Aspirin 81 mg daily.  7. Daily multivitamin.  8. Viagra 50 mg p.r.n.   ALLERGIES:  No known drug allergies.   FAMILY HISTORY:  1. He does have a history of coronary artery disease in his father.  2. History of intracranial aneurysm in an uncle.   SOCIAL HISTORY:  1. He is married with 3  children.  2. He is retired.  3. He quit smoking in 1999.  4. He does not drink alcohol on a regular basis.   REVIEW OF SYSTEMS:  He is generally active.  Weight is 220 pounds.  He  is 6 feet 2 inches tall.  He does have prior-mentioned COPD and prior-  mentioned cardiac disease.  He does have some ongoing back pain.   PHYSICAL EXAM:  A well-developed, well-nourished white male appearing  stated age 13.  Blood pressure is 114/72, heart rate 69, respirations  18.  His radial pulses are 2+.  He has 2+ femoral, 2+ popliteal and 2+  posterior tibial pulses bilaterally with no evidence of peripheral  aneurysms.  Abdominal Exam:  Reveals easily palpable aneurysm, he has  mild tenderness to palpation of his aneurysm.  Neurologic:  He is  grossly intact.   I reviewed the CT scan with Mr. Camey and his wife present.  I explained  that with an incidental finding of an asymptomatic 5.4 cm aneurysm his  annual risk of rupture would be approximately 5% per year.  I said that  we would typically recommend  elective repair in the 5 to 5.5 cm size  range with his age and general health.  I explained that his operative  mortality should be estimated at 2 to 3%.  I did discuss open versus  stent graft repair of his aneurysm.  He does have a relatively short  infrarenal aortic neck and does have ectasia of his iliac arteries  bilaterally, making stent graft repair possible, but with less long term  potential durability.  I did discuss open elective repair as well.  He  prefers to proceed with open aneurysm repair and I would agree with this  choice.  He and his wife have plans in May and he is leaning towards  repair of his aneurysm in June.  I explained that from an odd standpoint  his 76-month risk of rupture should approximate his risk for surgery.  He  understands approximately 80% mortality with ruptured aneurysm as well.  They will discuss this with their family and let us know when he wishes  to  proceed with elective aneurysm repair.   Larina Earthly, M.D.  Electronically Signed   TFE/MEDQ  D:  12/23/2007  T:  12/24/2007  Job:  [id number]   cc:   Thora Lance, M.D.  Lyn Records, M.D.

## 2011-03-12 NOTE — H&P (Signed)
NAMEJEYREN, DANOWSKI NO.:  [id number]   MEDICAL RECORD NO.:  [id number]          PATIENT TYPE:  INP   LOCATION:  3731                         FACILITY:  [facility]   PHYSICIAN:  Corky Crafts, MDDATE OF BIRTH:  Jun 20, 1941   DATE OF ADMISSION:  12/12/2007  DATE OF DISCHARGE:  12/13/2007                              HISTORY & PHYSICAL   REFERRING PHYSICIAN:  Dr. Kirby Funk.   REASON FOR VISIT:  1. Atrial fibrillation.  2. Coronary artery disease.  3. Nausea and vomiting.  4. Diabetes.   HISTORY OF PRESENT ILLNESS:  The patient is a 70 year old man who, over  the past few weeks, has had several bouts of a GI illness.  He has  reported nausea, vomiting, and diarrhea.  They have occurred  intermittently.  He has become very sick on several occasions, and he  seemed to recover, then a few days later the pattern will repeat itself.  This morning, he had his third episode of the same type of illness,  where he had nausea, vomiting and diarrhea.  He was feeling poorly and  went to the [place] walk-in clinic.  He was found to have a very fast  heart rate was subsequently sent to the emergency room.  His EKG  revealed atrial fibrillation with rapid ventricular response.  He never  noticed any blood in his stool.   PAST MEDICAL HISTORY:  1. Coronary artery disease status post MI.  2. Type 2 diabetes.  3. High cholesterol.  4. Obstructive sleep apnea.  5. GERD.  6. Spinal stenosis.  7. Diverticulosis.   PAST SURGICAL HISTORY:  Vasectomy and lumbar diskectomy.   ALLERGIES:  NO KNOWN DRUG ALLERGIES.   MEDICATIONS:  1. Nexium 40 mg.  2. Metoprolol 25 mg b.i.d.  3. Lipitor 10 mg day.  4. Actos 30 mg a day.  5. Aspirin 81 mg daily.  6. Ramipril 10 mg daily.   FAMILY HISTORY:  Father had rheumatoid arthritis and coronary artery  disease.  He has a mother and sister with coronary artery disease.   SOCIAL HISTORY:  Quit smoking in 1998.  He occasionally drinks  alcohol.  He is married.  He had been working at a golf course.   REVIEW OF SYSTEMS:  Significant for nausea and vomiting, as well as  diarrhea, also had some fatigue, weakness, and minimal palpitations.  No  significant weight loss.  His appetite returned back to normal.  No  bleeding problems.  He has had fevers intermittently, no rash.  He does  have some chronic right leg numbness, all other systems negative.   PHYSICAL EXAMINATION:  VITAL SIGNS:  Blood pressure is 113/72, heart  rate initially was anywhere from 140 to 160.  GENERAL:  Generally he is awake, alert, in no apparent distress.  HEENT:  Head normocephalic, atraumatic.  NECK:  No JVD.  CARDIOVASCULAR:  Irregularly irregular rhythm, tachycardiac.  LUNGS:  Clear to auscultation bilaterally.  ABDOMEN:  Soft, mildly distended, nontender.  EXTREMITIES:  Showed no edema, palpable pedal pulses.  SKIN:  No rash.  NEURO:  No focal motor deficits.  BACK:  Kyphosis.  PSYCH:  Normal mood and affect.  EKG shows atrial fibrillation with  rapid ventricular response.  CBC:  White blood cell count 13.1,  hematocrit 47.1, hemoglobin 16.2, platelets 194.  Troponin pending.  Thyroid function tests are pending.   ASSESSMENT/PLAN:  A 70 year old with known coronary artery disease, who  has had this GI illness, and now presents with new onset atrial  fibrillation.   PLAN:  1. Cardizem was started in the ER.  As I was examining him, he did      convert to normal sinus rhythm.  I will hold off on the Cardizem      drip for now, will double as metoprolol to 50 mg b.i.d.  2. Rule out for MI.  Continue aspirin.  3. Continue Lipitor for high cholesterol.  4. Will also obtain GI consult, per the patient's request.  They are      mostly concerned about his recurrent GI Illness.  The patient did      have some stool cultures done in his last hospitalization, about a      week ago.  They are concerned as to whether he may need some       endoscopy.  5. I will follow him while he is in the hospital      Corky Crafts, MD  Electronically Signed     JSV/MEDQ  D:  12/12/2007  T:  12/14/2007  Job:  [id number]   cc:   Thora Lance, M.D.  James L. Malon Kindle., M.D.

## 2011-03-12 NOTE — Discharge Summary (Signed)
NAMEENDY, EASTERLY NO.:  [id number]   MEDICAL RECORD NO.:  [id number]          PATIENT TYPE:  INP   LOCATION:  5501                         FACILITY:  [facility]   PHYSICIAN:  Thora Lance, M.D.  DATE OF BIRTH:  [birth year]   DATE OF ADMISSION:  04/24/2008  DATE OF DISCHARGE:  04/25/2008                               DISCHARGE SUMMARY   REASON FOR ADMISSION:  A 70 year old white male with a recent AAA repair  on March 31, 2008, with a postoperative course notable for prolonged ileus  and atrial fibrillation who woke up to go to the bathroom and had a  presyncopal episode followed by nausea and vomiting.  Blood pressure was  low at home.  EMS was called.  The patient did respond to IV fluids.  The patient's metoprolol had recently been increased from 100-200 mg a  day.   SIGNIFICANT FINDINGS:  VITAL SIGNS:  Blood pressure 115/74, heart rate  84, respirations 20, and temperature 97.1.  LUNGS:  Clear.  HEART:  Regular rate and rhythm without murmur, gallop, or rub.  ABDOMEN:  Benign.   LABORATORY DATA:  Urinalysis showed small ketones.  WBC 16,000, sodium  137, potassium 4.3, chloride 104, bicarbonate 27, BUN 11 and creatinine  0.9, glucose 184, CPK-MB 1.7, and troponin-I 0.05.  Chest x-ray, mild  vascular congestion.   HOSPITAL COURSE:  Presyncope and status post hypotension.  The patient  was admitted with presyncope and hypotension.  This was presumed  secondary to the recent increase and beta-blocker dose as well as  possibly some mild dehydration.  The patient was hydrated.  His beta-  blocker dose was decreased.   His blood pressure during the hospitalization was running in 110s to  130/80 range.  His beta-blocker was restarted at a lower dose at  discharge 25 mg b.i.d. of metoprolol.  Blood sugars were acceptable  during the hospitalization.  The patient's troponin did rise slightly to  0.09, then to 0.11, and then to 0.09 at discharge.  MBs remained  stable.  The patient had had a recent cardiac catheterization and his elevated  troponins were felt secondary to the hypotension and not sign of  unstable angina.  The patient was discharged in good condition.   DISCHARGE DIAGNOSES:  1. Presyncope.  2. Hypotension.  3. Coronary artery disease.  4. Abdominal aortic aneurysm, status post repair.  5. Diabetes mellitus.  6. History of paroxysmal atrial fibrillation.  7. Ischemic cardiomyopathy with ejection fraction of 45%.  8. Hyperlipidemia.  9. Gastroesophageal reflux disease.  10.Spinal stenosis.  11.Obstructive sleep apnea.   PROCEDURES:  CT scan of the abdomen and pelvis which showed the expected  postoperative changes with no evidence of aneurysmal leak or other  abnormality.   DISCHARGE MEDICATIONS:  1. Metoprolol 25 mg b.i.d.  2. Simvastatin 40 mg daily.  3. Ramipril 10 mg daily.  4. Aspirin 81 mg daily.  5. Protonix 40 mg daily.  6. Actos 30 mg daily.   DISPOSITION:  Discharged to home.   FOLLOWUP:  Followup in one week with Dr.  Griffin.   CODE STATUS:  Full code.   DIET:  Low-sodium diabetic diet.           ______________________________  Thora Lance, M.D.     JJG/MEDQ  D:  04/25/2008  T:  04/25/2008  Job:  [id number]

## 2011-03-12 NOTE — Assessment & Plan Note (Signed)
OFFICE VISIT   Kristopher, Schultz  DOB:  Apr 07, 1941                                       07/22/2008  ZOXWR#:[id number]   The patient presents today for final followup of repair of his  infrarenal abdominal aortic aneurysm from 03/31/2008.  He had a  protracted hospitalization with prolonged ileus, which eventually  resolved.  The patient fortunately reports today that he feels as good  as he did before the operation.  He does continue to have some  diminished stamina, but otherwise is back to his baseline.  He is  returning to his usual weight.   PHYSICAL EXAM:  Abdominal incision is well-healed without evidence of  hernia.  He has 2+ femoral and 2+ pedal pulses bilaterally.   I am quite pleased with his result, as is the patient.  He will continue  his usual activities without limitation.  Will see [place] again on as needed  basis.   Larina Earthly, M.D.  Electronically Signed   TFE/MEDQ  D:  07/22/2008  T:  07/25/2008  Job:  [id number]   cc:   Thora Lance, M.D.  Lyn Records, M.D.

## 2011-03-12 NOTE — H&P (Signed)
[name], [name] NO.:  [id number]   MEDICAL RECORD NO.:  [id number]          PATIENT TYPE:  INP   LOCATION:  6705                         FACILITY:  [facility]   PHYSICIAN:  Thora Lance, M.D.  DATE OF BIRTH:  10/14/1941   DATE OF ADMISSION:  12/03/2007  DATE OF DISCHARGE:                              HISTORY & PHYSICAL   CHIEF COMPLAINT:  Nausea, vomiting and diarrhea.   HISTORY OF PRESENT ILLNESS:  This is a 70 year old white male who this  morning at 4 a.m. woke up with severe nausea and multiple episodes of  vomiting bilious material.  He has also had multiple watery diarrheal  stools.  He has been extremely weak and dizzy when he ambulates.  He has  been unable to ambulate without assistance of his wife.  He has had only  very mild abdominal pain.  No definite fevers, chills, melena or coffee  ground emesis.  The patient had a very similar episode on January 20  when he presented to the office with similar severe symptoms.  He was  assumed to have acute gastroenteritis and was treated conservatively  with improvement within a couple days.   PAST MEDICAL HISTORY:  1. Coronary artery disease.  Dr. Verdis Prime.  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  PTCA at that time.  August  1998      recurrent chest discomfort.  Cardiac cath with subsequent LAD      rotational atherectomy.  July 1999 acute inferolateral MI      complicated by ventricular fibrillation.  PTCA and stent of OM1 Dr.      Elease Hashimoto.  Chest pain again October 1999.  Cardiac catheterization      LAD less than 30%, circumflex 40-60%, patent stent RCA 100%, left      main okay.  EF mildly decreased with inferior akinesis.  The      patient has done well over the last 9 years without any further      significant event.  2. Diabetes mellitus.  3. Dyslipidemia.  4. Obstructive sleep apnea on CPAP.  5. Lumbar spinal stenosis.  6. GERD.  7. Arthralgia with negative workup.  8.  Peyronie's disease.  9. Herpes zoster.   PAST SURGICAL HISTORY:  1. Vasectomy.  2. Lumbar diskectomy, Dr. Danielle Dess early 2003.   CURRENT MEDICATIONS:  1. Altace 10 mg p.o. daily.  2. Nexium 40 mg p.o. daily.  3. Metoprolol 50 mg one-half b.i.d.  4. Lipitor 10 mg daily.  5. Actos 30 mg daily.  6. Aspirin 81 mg a day.  7. Multivitamin daily.  8. Viagra 50 mg p.r.n.   ALLERGIES:  No known drug allergies.   FAMILY HISTORY:  Father with rheumatoid arthritis and diabetes, heart  disease and renal disease.  Mother with diabetes, coronary artery  disease.  Sister with coronary artery disease in her 6s.   SOCIAL HISTORY:  Married, 3 children.  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  Smoking 1/2 to 1 pack  a day for many years, quit August 1998.  Alcohol occasional.   REVIEW OF SYSTEMS:  Otherwise negative.   PHYSICAL EXAMINATION:  GENERAL:  An ill, weak appearing white male.  VITAL SIGNS:  BP 82/52, heart rate 80, temperature 99.1.  HEENT:  Eyes anicteric.  Pupils equal, round and respond to light.  Ears  - TMs clear.  Oropharynx dry mucous membranes.  NECK:  Supple.  No lymphadenopathy.  LUNGS:  Clear.  HEART:  Regular rate and rhythm without murmur, gallop or rub.  ABDOMEN:  Soft, mild diffuse tenderness, no rebound or guarding.  Normal  bowel sounds.  No masses.  RECTAL:  Normal tone.  Stool is brown and heme negative.  EXTREMITIES:  No edema.  NEUROLOGICAL:  Nonfocal.   LABORATORY AND X-RAY:  Workup pending.   ASSESSMENT:  1. Acute episode of nausea, vomiting and diarrhea.  It is possible      this could be another episode of acute viral gastroenteritis      although it would be unusual that this is the second severe episode      in the last 2 weeks' time.  2. Hypotension/volume depletion.  3. Coronary artery disease.  4. Diabetes mellitus.  5. Dyslipidemia.  6. Spinal stenosis.   PLAN:  1. Admit to telemetry.  2. IV fluid  resuscitation.  3. Routine labs.  4. Acute abdominal series.  5. Sliding scale insulin.  6. Follow clinically, consider a gastrointestinal consultation.           ______________________________  Thora Lance, M.D.     Delorse Limber  D:  12/03/2007  T:  12/04/2007  Job:  [id number]

## 2011-03-12 NOTE — Consult Note (Signed)
NAMEALANZO, [name] NO.:  [id number]   MEDICAL RECORD NO.:  [id number]          PATIENT TYPE:  INP   LOCATION:  2033                         FACILITY:  [facility]   PHYSICIAN:  Lorne Skeens. Hoxworth, M.D.DATE OF BIRTH:  May 04, 1941   DATE OF CONSULTATION:  04/11/2008  DATE OF DISCHARGE:                                 CONSULTATION   CHIEF COMPLAINT:  Nausea and vomiting postop AAA repair.   HISTORY OF PRESENT ILLNESS:  I was asked by Dr. Tawanna Cooler Early to evaluate  Mr. Coury.  He is a very pleasant 70 year old white male who is 11 days  status post apparently uncomplicated abdominal aortic aneurysm repair.  The patient initially did very well but at about 4 days postoperatively  developed some abdominal distention, nausea and NG tube was placed.  The  symptoms resolved and his NG tube was removed several days ago and diet  cautiously advanced.  However, yesterday he developed recurrent  abdominal distention, nausea and vomiting and also frequent watery  diarrhea.  X-ray showed dilated bowel as described below and the NG tube  was placed with fairly copious biliary drainage.  Since yesterday, the  patient has not had any bowel movements.  He denies any abdominal pain  or cramping.  He generally feels reasonably well.   The patient was hospitalized earlier this year after two episodes of  nausea, vomiting and diarrhea without pain which resolved spontaneously  and workup was negative.  CT scan during those episodes revealed his  aneurysm.  The patient denies any chronic GI complaints except for some  reflux well-controlled on medications.  He typically has normal bowel  movements and no difficulty eating.  The gallbladder and GI tract  appeared normal at the time of his aneurysm repair.   PAST MEDICAL HISTORY:  Previous surgery includes spinal surgery for  spinal stenosis.  Medically, he is followed by Dr. Valentina Lucks for  hypertension, GERD, elevated cholesterol,  non-insulin-dependent diabetes  mellitus and sleep apnea requiring CPAP.   Medications prior to admission were  1. Altace 10 mg daily.  2. Nexium 40 daily.  3. Metoprolol 25 daily.  4. Lipitor 10 daily.  5. Actos 30 daily.  6. Aspirin 81 mg daily.   Current medications prior to NG tube placement were aspirin, Lovenox 40  mg daily, sliding scale insulin, Lopressor, Protonix, simvastatin,  tramadol, acetaminophen, Percocet and Phenergan.   ALLERGIES:  None known.   SOCIAL HISTORY:  He is married, retired.  Quit smoking 10 years ago,  rare alcohol.   FAMILY HISTORY:  Positive for heart disease.   REVIEW OF SYSTEMS:  GENERAL:  Denies chronic wheezing or shortness of  breath.  He does use CPAP.  CARDIAC:  Denies chest pain, palpitations.  GI As above.  GU:  No difficulties.   PHYSICAL EXAMINATION:  VITAL SIGNS:  He is afebrile, blood pressure  133/74, heart rate 90, respirations 18, O2 sats 97% on room air.  GENERAL:  Alert, no acute distress.  SKIN:  Warm and dry.  HEENT:  No palpable mass or thyromegaly.  Sclerae  nonicteric.  LYMPH NODES:  No cervical, supraclavicular or inguinal nodes palpable.  LUNGS:  Clear without wheezing or increased work of breathing.  CARDIAC:  Regular rate and rhythm.  No murmurs.  EXTREMITIES:  Peripheral pulses intact.  No edema.  ABDOMEN:  Well-healing, clean midline incision with staples.  No  incisional or inguinal hernias.  I cannot detect any bowel sounds.  Abdomen is moderately distended but soft and nontender.  No palpable  masses.   LABORATORY:  Lipase and amylase normal.  Electrolytes abnormal for  potassium of 3.0, glucose is 113, white count is 9.8, hemoglobin 11.   Abdominal x-rays reviewed.  These show a moderately dilated small bowel  and decompressed colon most consistent with a partial distal bowel  obstruction.   ASSESSMENT/PLAN:  Abdominal distention, nausea, vomiting and episode of  diarrhea following aortic aneurysm repair.   Clinically, he appears to  have an ileus and that he is not having any pain and has no bowel  sounds.  X-rays are more suspicious for a partial distal small bowel  obstruction.   At this point, I do not see any indication for surgical intervention.  I  am going to order T&A as he is approaching 2 weeks postoperatively and  could require surgical intervention.  Recommend continued bowel rest,  nasogastric suction and follow-up abdominal films.  Replace potassium.   I am not sure of the significance of his previous episodes of nausea,  vomiting and diarrhea with negative workup and a negative exploration at  the time of his aneurysm repair.      Lorne Skeens. Hoxworth, M.D.  Electronically Signed     BTH/MEDQ  D:  04/11/2008  T:  04/12/2008  Job:  [id number]

## 2011-03-12 NOTE — Consult Note (Signed)
NAMEJR, MILLIRON NO.:  [id number]   MEDICAL RECORD NO.:  [id number]          PATIENT TYPE:  INP   LOCATION:  2033                         FACILITY:  [facility]   PHYSICIAN:  Jake Bathe, MD      DATE OF BIRTH:  1941-09-15   DATE OF CONSULTATION:  04/03/2008  DATE OF DISCHARGE:                                 CONSULTATION   PRIMARY CARDIOLOGIST:  Dr. Verdis Prime   REASON FOR CONSULTATION:  Mr. Neuharth is being seen at the request of Dr.  Fabienne Bruns for the evaluation of nonsustained ventricular  tachycardia and positive troponin in the setting of recent abdominal  aortic aneurysm repair.   HISTORY OF PRESENT ILLNESS:  Mr. Kalter is a pleasant 70 year old male  patient of Dr. Sherilyn Cooter Smith's with known coronary disease status post MI  in the RCA circumflex distribution with prior percutaneous  interventions, with ischemic cardiomyopathy, EF 30-35%, status post  abdominal aortic aneurysm repair by Dr. Arbie Cookey on March 31, 2008, who  recently on telemetry this morning was found to have seven beats of  nonsustained ventricular tachycardia at a rate of approximately 150  beats per minute.  He was asymptomatic at the time.  His heart rate prior to this was elevated in the 110 range, sinus  tachycardia.   An ECG was performed which showed sinus rhythm, right bundle-branch  block/intraventricular conduction delay with new T-wave inversion in V1  and V2, and T-wave flattening in V3 which is different from prior ECG  dated April 01, 2008.  He was given metoprolol 5 mg IV which slowed his  heart rate down into the 80s.   Upon questioning him now, he denies any chest pain, shortness of breath,  fevers, chills, orthopnea, PND.  He states that he would not have known  about the arrhythmia if we had not discussed this.  No syncope, no  palpitations.   PAST MEDICAL HISTORY:  1. Coronary artery disease .  Status post PCI to circumflex stent      placed in 1988, RCA percutaneous  intervention in 1993, LAD      intervention with atherectomy catheter in 1998.  Recent cardiac      catheterization showed patent LAD stent with ejection fraction of      35-45%.  2. Ischemic cardiomyopathy, ejection fraction 35-45% as above.  3. Paroxysmal atrial fibrillation, currently in sinus rhythm seen on      previous admission.  4. Diabetes mellitus.  5. Hyperlipidemia.  6. Obstructive sleep apnea.  7. GERD.  8. Spinal stenosis.  9. Diverticulosis/diarrhea.   ALLERGIES:  No known drug allergies.   MEDICATIONS:  1. Zocor 20 mg once a day.  2. Pioglitazone 30 mg a day.  3. Altace 10 mg a day.  4. Lopressor 50 mg a day.  5. Aspirin 81 mg a day.  6. Protonix.  7. Metoprolol 5 mg IV given x1.  8. Enoxaparin, DVT prophylaxis dose.   FAMILY HISTORY:  His father had coronary artery disease.   SOCIAL HISTORY:  He quit smoking in 1990, he  drinks alcohol regularly,  is married.  Wife here.   REVIEW OF SYSTEMS:  Unless explained above, all other 12 review of  systems negative.   PHYSICAL EXAMINATION:  Temperature 98.1, pulse ranged from 108 down to  81, respirations 18, saturating 98% on 2 L.  GENERAL:  Alert and oriented x3, in no acute distress, comfortable in  bed,; however, does appear sleepy, likely sedated slightly from PCA  pump.  EYES:  Well-perfused conjunctivae.  No scleral icterus.  NECK:  No carotid bruits appreciated.  No JVD.  CHEST:  Regular rate and rhythm with soft systolic murmur heard left  upper sternal border.  Laterally displaced PMI.  LUNGS:  Clear to auscultation bilaterally anteriorly with normal  respiratory effort.  No wheezes heard.  ABDOMEN:  Wound clean, dry and intact.  Positive bowel sounds, albeit  hypoactive.  EXTREMITIES:  No clubbing, cyanosis or edema.  Normal distal pulses.  NEUROLOGIC:  Slightly sedated but nonfocal.  No tremors.  SKIN:  Warm, dry, intact.  No rashes.   DATA:  ECG as described above, showing a sinus rhythm,  interventricular  conduction delay or right bundle-branch block with new T-wave inversion  in V1 and V2 and Q waves in III and aVF suggestive of old inferior  infarction.  Chest x-ray personally reviewed June 5 showed shallow lung  volumes, otherwise no acute airspace disease.  Troponin 0.12, down from  0.16 in February of this year.  Sodium 132, potassium 4.1, BUN 13,  creatinine 0.87, glucose 163.  White count 8.7, hemoglobin 11.9,  hematocrit 33.5, platelet count 112, up from 87.  TSH from February was  normal limits, 0.449.  Telemetry as described above showed seven beats  of nonsustained VT and tachycardia which is now sinus rhythm.   ASSESSMENT/PLAN:  A 70 year old male status post abdominal aortic  aneurysm repair (20 x 10 Hemashield aorta to bi-ileal bypass) by Dr.  Arbie Cookey on June 4, 2009m with coronary artery disease, prior percutaneous  coronary intervention, ischemic cardiomyopathy, ejection fraction 35-  45%, diabetes, hyperlipidemia, hypertension, with telemetry noteworthy  for seven beats of ventricular tachycardia, nonsustained, earlier, and  are cardiac biomarker, troponin mildly elevated at 0.12.   1. Nonsustained ventricular tachycardia.  This is commonly seen in the      setting of ischemic cardiomyopathy.  However, his ECG does show      some T-wave inversion in V1 and V2 which is nonspecific.  However,      it could be indicative of a small degree of ischemia.  We will      repeat echocardiogram to ensure that there has not been in dramatic      decrease in his ejection fraction, and will increase his beta      blocker from 50 mg once a day to Lopressor 50 mg q.6h. with hold      parameters.  If he is able tolerate this dose, he will be able to      be transformed to Toprol-XL 200 mg a day which is goal heart      failure dose.  His lungs sound clear and he does not have any JVD      appreciated so I feel comfortable increasing in his beta-blocker at      this time.   Monitor closely for O2 saturation decrease.  2. Mildly elevated troponin.  Doubt acute coronary syndrome currently.      His troponin is slightly decreased from what it was in February.  We will check CK-MB, troponin q.8h. x3.  Continue with aspirin,      beta blocker, ACE inhibitor, statin.  Checking echocardiogram.  3. Coronary artery disease status post multiple percutaneous coronary      interventions as above.  4. Paroxysmal atrial fibrillation; no current issue.  5. Diabetes mellitus on Actos, insulin sliding scale.  6. Hypertension.  Altace, beta-blocker as above.  7. Abdominal aortic aneurysm repair, currently stable postoperatively.   We will follow along with you and relay information to Dr. Verdis Prime.      Jake Bathe, MD  Electronically Signed     MCS/MEDQ  D:  04/03/2008  T:  04/03/2008  Job:  [id number]   cc:   Janetta Hora. Darrick Penna, MD

## 2011-03-12 NOTE — Discharge Summary (Signed)
NAMEHOLDEN, MANISCALCO NO.:  [id number]   MEDICAL RECORD NO.:  [id number]          PATIENT TYPE:  INP   LOCATION:  6705                         FACILITY:  [facility]   PHYSICIAN:  Thora Lance, M.D.  DATE OF BIRTH:  03-12-1941   DATE OF ADMISSION:  12/03/2007  DATE OF DISCHARGE:  12/05/2007                               DISCHARGE SUMMARY   ADMITTING DIAGNOSIS:  Nausea, vomiting and diarrhea.   DISCHARGE DIAGNOSES:  1. Nausea and vomiting, diarrhea, possibly viral, question ischemic      bowel as this is his second episode.  2. Hypotension, volume depletion.  3. Coronary artery disease.  4. Diabetes mellitus.  5. Dyslipidemia.  6. Spinal stenosis.  7. 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   BRIEF HISTORY:  Mr. Granja is a very nice 70 year old male who on the day  of admission woke up at 4 a.m. with severe nausea with several episodes  of vomiting.  He had also had a number of watery stools.  No fevers or  chills.  He came in to the office to be evaluated.   PHYSICAL EXAM:  His temperature was 99.1, blood pressure 82/52, heart  rate 80.  HEENT:  Unremarkable.  LUNGS:  Clear.  HEART:  Regular rate and rhythm without murmurs, rubs or gallops.  ABDOMEN:  Soft, no tenderness, no rebound or guarding.  RECTAL EXAM:  Stool was brown and heme negative.   LABORATORY DATA ON ADMISSION:  A CBC revealed a hemoglobin 14.9, white  count 9100.  Chemistry study:  Sodium 139, potassium 3.7, chloride 107,  bicarbonate 25, BUN 18, creatinine 0.76, blood sugar 173.  Prothrombin  time 14.5 with an INR of 1.1.  AST 22, ALT 28, alkaline phosphatase 62,  total bili 1.5, amylase 69, lipase 16.  Troponin 0.12, CK 160 with an MB  of 3.7.  Repeat troponin 0.15.  CK 102, MB 3.1.  Calcium 8.2.  Urinalysis positive for protein, otherwise negative.   HOSPITAL COURSE:   Patient was admitted, placed on IV fluids.  He was  made initially n.p.o.  He had an acute abdominal series.  Chest film  revealed suspicious opacification in the right upper lung zone,  nonspecific bowel gas pattern, there was a concern for an abdominal  aortic aneurysm.  He then underwent a CT scan of the chest and abdomen.  CT scan of the chest revealed fat in the major fissure corresponding  with the abnormality seen on chest x-ray.  There was a nodule  posteriorly on the right adjacent to the fifth rib which is felt to be  stable, no followup recommended.  A CT scan of the abdomen and pelvis  revealed aneurysmal dilatation of the abdominal aorta, transverse  maximal diameter 5.4, it was 9.7 cm long.  There was dilatation of the  right common iliac artery of 18 mm and of the left 23 mm.   DISPOSITION:  The patient is  discharged home in an improved condition.   MEDICATIONS AT DISCHARGE:  1. Nexium 40 mg a day.  2. Metoprolol 50 mg, 1/2 twice a day.  3. Lipitor 10 mg a day.  4. Actos 30 mg a day.  5. Aspirin 81 mg a day.  6. Ramipril one a day.   He is to follow up with Dr. Kirby Funk in 1 week, at which time they  will discuss further evaluation of the abdominal aortic aneurysm.     ______________________________  Sunday Spillers Pete Glatter, M.D.    ______________________________  Thora Lance, M.D.    HTS/MEDQ  D:  12/06/2007  T:  12/07/2007  Job:  [id number]

## 2011-03-12 NOTE — Discharge Summary (Signed)
[name], FLEGEL NO.:  [id number]   MEDICAL RECORD NO.:  [id number]          PATIENT TYPE:  INP   LOCATION:  2033                         FACILITY:  [facility]   PHYSICIAN:  Larina Earthly, M.D.    DATE OF BIRTH:  [birth year]   DATE OF ADMISSION:  03/31/2008  DATE OF DISCHARGE:  04/19/2008                               DISCHARGE SUMMARY   DIAGNOSIS:  Abdominal aortic aneurysm.   DISCHARGE DIAGNOSIS:  Abdominal aortic aneurysm.   PROCEDURE PERFORMED:  March 31, 2008, repair of abdominal aortic aneurysm  by Dr. Arbie Cookey.   COMPLICATIONS:  None.   DISCHARGE MEDICATIONS:  1. Lipitor 10 mg p.o. daily.  2. Actos 30 mg p.o. daily.  3. Altace 10 mg p.o. daily.  4. Metoprolol 100 mg p.o. daily and at bedtime.  5. Omeprazole 20 p.o. daily.  6. Aspirin 81 mg p.o. daily.  7. Percocet 5/325 one p.o. q.4 h. p.r.n. pain.  Total #60 were given.   DISPOSITION:  Home in stable condition.  He is instructed to observe his  wounds for drainage and any signs of infection.  He is to increase his  activity slowly.  He should not drive until after he has been seen by  Dr. Arbie Cookey.  He is given an appointment to see Dr. Arbie Cookey in 2 weeks with  ABIs.  The office will call for the appointment.   BRIEF IDENTIFYING STATEMENT:  For complete details, please refer the  typed history and physical.   BRIEF HISTORY:  Briefly, this very pleasant 70 year old gentleman was  referred to Dr. Arbie Cookey for an asymptomatic infrarenal abdominal aortic  aneurysm with extension into bilateral common iliac arteries.  Dr. Arbie Cookey  recommended abdominal aortic aneurysm repair.  He was informed of the  risk and benefits of the procedure and after careful consideration,  elected to proceed with surgery.   HOSPITAL COURSE:  Preoperative workup was completed as an outpatient.  He was brought in through same-day surgery and underwent the  aforementioned repair.  For complete details, please refer the typed  operative  report.  The procedure was without complications.  He was  returned to the intensive care unit in critical but hemodynamically  stable condition.  Mechanical ventilation was weaned.  We were able to  extubate him by the first postoperative day.  On the first postoperative  day, he was able to get out of bed and into a chair.  He was transferred  to a bed on a surgical convalescent floor.  Postoperatively, he had  issues initially with pain control which was eventually resolved.  We  asked his cardiologist to evaluate him due to a rapid heart rate with  EKG changes and troponin of 0.12.  His heart rate was treated with  increased beta-blockade.   Postoperatively, he had a very slow recovery due to an ileus.  This  unfortunately did not resolve for approximately 10 days.  He was started  on TPN.  We requested a general surgical consult for assistance and do  appreciate their  diligent care.  Eventually, he  was able to eat.  His TPN was  discontinued.  He was walking and improving with physical therapy.  His  wounds were healing well.  He was desirous of discharge on April 19, 2008.  He was stable and was discharged home.      [name], PA      Larina Earthly, M.D.  Electronically Signed    KEL/MEDQ  D:  04/19/2008  T:  04/20/2008  Job:  [id number]   cc:   Larina Earthly, M.D.

## 2011-03-12 NOTE — Cardiovascular Report (Signed)
[name], [name] NO.:  [id number]   MEDICAL RECORD NO.:  [id number]          PATIENT TYPE:  OIB   LOCATION:  2899                         FACILITY:  [facility]   PHYSICIAN:  Lyn Records, M.D.   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  Circumflex was stented in 1998.  Angioplasty on the right coronary in  1993.  He has also undergone LAD angioplasty and stenting using  rotational atherectomy in 1998.  The patient recently has had recurring  episodes of nausea, vomiting, and diarrhea similar to associated  symptoms with his prior anterior infarctions.  He has also been admitted  with atrial fibrillation.  He had elevated cardiac markers on that  admission and also on additional admission when he was admitted with  diarrhea and hypotension.  This study is being done to define coronary  anatomy.   PROCEDURE PERFORMED:  1. Left heart cath.  2. Selective coronary angio.  3. Left ventriculography.  4. Abdominal aortography.   DESCRIPTION:  After informed consent, a 6-French sheath was placed in  the right femoral artery.  We then used a 0.038 J wire and were careful  to tease our way up the abdominal aorta where we knew the patient had an  aneurysm.  We encountered no difficulty with traversing this region.  We  then did all catheter movement over the wire and with wire exchange  above the abdominal aortic aneurysm.  We used a 6-French A2 multipurpose  catheter for hemodynamic recordings, left ventriculography by hand  injection, and selective left and right coronary angiography.  We  performed abdominal aortogram using 40 mL of contrast at 20 mL per  second.  We subsequently performed manual compression for  hemostasis.  Aortography was performed because the patient is known to have an  abdominal aortic aneurysm.   RESULTS:  1. Hemodynamic data:      a.     Aortic pressure 109/70.      b.     The left ventricular pressure 112/14.  2. Left ventriculography:  Left ventricle demonstrates decreased LV      function with an EF in the 35% to 45% range.  The anterior wall and      lateral wall are severely hypokinetic.  No significant mitral      regurgitation is noted.  3. Coronary angiography:      a.     Left main coronary:  Widely patent.      b.     The left anterior descending coronary:  The LAD is a large       vessel that wraps around left ventricular apex.  It gives       collaterals to the PDA via septal perforator collaterals.  It also       gives collaterals around the apex to acute marginal branch of  the       right coronary.  The LAD in the region of prior stent proximal to       the second septal perforator is in a region of tortuosity, but       there is no evidence of any high-grade obstruction noted.  Heavy       calcification in this region does somewhat hinder interpretation.       Two diagonals are large and patent.      c.     The circumflex artery:  The circumflex coronary artery is a       large vessel that gives origin to a large second obtuse marginal.       It bifurcates the small first obtuse marginal and the small third       obtuse marginal.  The circumflex coronary artery gives collaterals       to the distal right coronary via left atrial recurrent branch.      d.     The right coronary:  The right coronary is totally occluded       and is collateralized from both the circumflex and the left       anterior descending.  4. Abdominal aortography:  The abdominal aorta is heavily calcified.      There is a large infrarenal abdominal aortic aneurysm.  Abdominal      aortography demonstrates a large fusiform aneurysm extending from      the renal to slightly  suprarenal territory to just above  the      aortic bifurcation.  This aneurysm is calcified and partially      thrombosed.   CONCLUSION:  1. Significant coronary artery disease with total occlusion of the      right coronary, chronic, and collateralized from both LAD and      circumflex.  2. Coronary artery disease involving the LAD and circumflex with      widely patent stent in the proximal circumflex and proximal LAD      without any evidence of any other significant obstructions.  3. Left ventricular dysfunction with inferolateral severe hypokinesis      and EF in the 35% to 45% range.  4. Large abdominal aortic aneurysm calcified, partially thrombosed,      extending from the renals to the bifurcation.   PLAN:  The patient to proceed with evaluation for GI causes of diarrhea,  nausea, and vomiting and also is to see Dr. Arbie Cookey for plans about  abdominal aortic aneurysm repair.      Lyn Records, M.D.  Electronically Signed     HWS/MEDQ  D:  12/17/2007  T:  12/18/2007  Job:  [id number]   cc:   Fayrene Fearing L. Malon Kindle., M.D.  Thora Lance, M.D.  Larina Earthly, M.D.

## 2011-03-12 NOTE — H&P (Signed)
NAMEVERTIS, SCHEIB NO.:  [id number]   MEDICAL RECORD NO.:  [id number]          PATIENT TYPE:  OBV   LOCATION:  5501                         FACILITY:  [facility]   PHYSICIAN:  Gardiner Barefoot, MD    DATE OF BIRTH:  09-28-1941   DATE OF ADMISSION:  04/24/2008  DATE OF DISCHARGE:                              HISTORY & PHYSICAL   PRIMARY CARE PHYSICIAN:  Thora Lance, MD   CHIEF COMPLAINT:  Near syncope.   HISTORY OF PRESENT ILLNESS:  This is a 70 year old male with a history  of recent AAA repair on March 31, 2008, with a notable postop complication  of ileus requiring TPN who presents here with a presyncopal episode.  The patient woke up from sleep to urinate, got up, and after urinating,  he noted lightheadedness and this persisted for some time.  He did have  a home blood pressure monitor, which did show low blood pressures in the  80s systolic as well as blood pressure when EMS arrived.  The patient  denies any loss of consciousness and does remember events.  He denies  any recent illnesses.  After receiving IV hydration by EMS, his blood  pressure did respond well and the patient does feel like he is back to  how he felt prior to this episode.  The patient does report that he has  been eating more and has been feeling better and better.  The patient  does report that he did decrease his metoprolol dose the evening prior  to this admission from 100 mg twice a day to 50, the last dose,  secondary to his low blood pressure that he had noted at home prior to  this.   PAST MEDICAL HISTORY:  1. CAD, status post PCI.  2. Ischemic cardiomyopathy with an EF of 45%.  3. Paroxysmal Atrial fibrillation.  4. Diabetes.  5. Hyperlipidemia.  6. GERD.  7. Spinal stenosis.  8. Obstructive sleep apnea.  9. Diverticulosis.   MEDICATIONS:  1. Metoprolol 100 mg p.o. b.i.d.  2. Zocor 40 mg p.o. daily.  3. Altace 10 mg daily.  4. Aspirin 81 mg daily.  5. Protonix 40 mg  daily.  6. Pioglitazone 30 mg daily.   SOCIAL HISTORY:  The patient reports a history of tobacco having quit in  1990 and occasional alcohol.  He is married.   FAMILY HISTORY:  His father had CAD.   REVIEW OF SYSTEMS:  Negative except as per history of present illness.   PHYSICAL EXAMINATION:  VITAL SIGNS:  Temperature is 97.1, pulse is 84,  respirations 20, blood pressure 115/74, and O2 sats 97%.  GENERAL:  The patient is awake, alert, and oriented x3, and appears in  no acute distress.  CARDIOVASCULAR:  Regular rate and rhythm with no murmurs, rubs, or  gallops.  LUNGS:  Clear to auscultation bilaterally.  ABDOMEN:  Soft, nontender with mild distention.  Surgical scar is  healing well with no erythema.  Staples have been removed.  EXTREMITIES:  No cyanosis, clubbing, or edema.   LABORATORY DATA:  UA significant for some small ketones.  WBC 16, 88%  neutrophils, hemoglobin 13, and platelets 276.  Sodium 137, potassium  4.3, chloride 104, bicarb 27, BUN 11, creatinine 0.97, mean glucose is  184, CK-MB is 1.7, and troponin 0.05.  Chest x-ray has mild vascular  congestion and peribronchial thickening.   ASSESSMENT/PLAN:  1. Presyncope.  I suspect this may be in part due to increased      metoprolol dose and this may need to be tapered down a bit.  We      will hold it at this time to assure that his blood pressure remains      stable.  I will also hydrate him, as he does have some dehydration      with notable ketones on his urinalysis.  We will admit the patient      for observation and consider cardiology input regarding his      presyncopal episode.  We will overnight also monitor his cardiac      enzymes.  2. Diabetes.  We will put the patient on a sliding scale insulin at      this time and I will hold his home blood pressure medications.      Gardiner Barefoot, MD  Electronically Signed     RWC/MEDQ  D:  04/24/2008  T:  04/24/2008  Job:  [id number]

## 2011-03-12 NOTE — Op Note (Signed)
[name], [name] NO.:  [id number]   MEDICAL RECORD NO.:  [id number]          PATIENT TYPE:  INP   LOCATION:  2302                         FACILITY:  [facility]   PHYSICIAN:  Larina Earthly, M.D.    DATE OF BIRTH:  1940-12-14   DATE OF PROCEDURE:  03/31/2008  DATE OF DISCHARGE:                               OPERATIVE REPORT   PREOPERATIVE DIAGNOSES:  Asymptomatic infrarenal abdominal aortic  aneurysm with extension into bilateral common iliac artery aneurysms.   POSTOPERATIVE DIAGNOSES:  Asymptomatic infrarenal abdominal aortic  aneurysm with extension into bilateral common iliac artery aneurysms.   PROCEDURE:  Resection of graft for abdominal aortic aneurysm and repair  with a 20 x 10 Hemashield aorta to bilateral distal common iliac artery  bypass.   SURGEON:  Larina Earthly, MD   ASSISTANT:  [name], PA-C   ANESTHESIA:  General endotracheal, Dr. Randa Evens.   COMPLICATIONS:  None.   DISPOSITION:  To recovery room, stable.   PROCEDURE IN DETAIL:  The patient was taken to the operating room,  placed in supine position, where the area of the abdomen and both groins  were prepped and draped in usual sterile fashion.  Incision was made  from the level of the xiphoid to below the umbilicus and carried down to  the midline fascia with electrocautery.  The abdomen was entered and  Omni-Tract retractor was used for exposure.  The gallbladder, stomach,  small and large bowel were normal as was the liver.  The transverse  colon and omentum reflected superiorly and the small bowel was reflected  to the right.  The duodenum was mobilized off the aneurysm.  The  infrarenal aorta was encircled below the level of renal arteries. The  patient did have 2 renal arteries and the lowest right renal was  identified at the level of the aneurysm beginning.  Dissection was then  turned down on to the aortic bifurcation.  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  This was ligated with 2-0 silk ties  and divided.  The patient had extensive calcification in the common  iliac arteries bilaterally.  The internal and external iliac arteries  were encircled with vessel loops bilaterally and were not aneurysmal.  The patient was given 25 g of mannitol and 9000 units of intravenous  heparin.  After adequate circulation time, the aorta was occluded below  the level of renal arteries with a Harken clamp.  The internal and  external iliac arteries were occluded bilaterally with Heaney clamps.  The aneurysm wall was opened longitudinally with electrocautery.  Lumbar  back bleeders were controlled with 2-0 silk figure-of-eight silk  sutures.  The patient did have significant number of lumbar back  bleeding.  The aorta was transected below the level of proximal clamp.  A 20 x 10 Hemashield graft was brought on to the field and using a felt  strip for proximal anastomosis, the graft was sewn into the aorta with a  running 3-0 Prolene suture.  This anastomosis was tested and found to be  adequate.  The 2 limbs of the graft were then occluded with turbidy  graft clamps.  Next, the common iliac artery were transected at the  iliac bifurcation bilaterally.  There was calcification in this area and  there was partial endarterectomy on the left.  The left limb of the  graft was cut in appropriate length and was sewn end-to-end to the  common iliac artery bifurcation with a running 5-0 Prolene suture.  Prior to completion of the anastomosis, the usual flushing maneuvers  were undertaken.  The anastomosis was completed and flow was restored to  the left leg and left pelvis.  A good pulse was noted at the groin.  Next, attention was turned to the right common iliac artery and again  with aneurysm down to the iliac bifurcation.  Again, this was resected  and the right limb of the graft was cut in appropriate length and was  sewn end-to-side to the junction of  the internal and external iliac  arteries on the right.  Again, the usual flushing maneuvers were  undertaken and anastomosis was completed.  Flow was restored to the  right leg and again palpable femoral pulse was noted.  The patient was  given 50 mg of Protamine to reverse the heparin.  The wound was  irrigated with saline.  Hemostasis with electrocautery.  The wounds were  again evaluated and there was no evidence of any bleeding from the  anastomosis.  The wall of the old aneurysm was closed over the graft  repair with a running 2-0 Vicryl suture.  The retroperitoneum was then  closed with a running 2-0 Vicryl sutures to ensure no contact between  the bowel and the graft.  Small bowel throughout its entirety was found  to be without injury and placed back in the pelvis.  Transverse colon  and omentum were placed over this.  The midline fascia was closed with a  #1 PDS suture beginning proximally and distally and tying in the middle.  Skin was closed with skin clips.  Sterile dressing was applied.  The  patient had palpable posterior tibial pulses bilaterally and was  transferred to the recovery room, extubated in stable condition.      Larina Earthly, M.D.  Electronically Signed     TFE/MEDQ  D:  03/31/2008  T:  04/01/2008  Job:  [id number]   cc:   Thora Lance, M.D.  Lyn Records, M.D.

## 2011-03-12 NOTE — Assessment & Plan Note (Signed)
OFFICE VISIT   [name], [name]  DOB:  26-Dec-1940                                       05/13/2008  [id number]#:[id number]   The patient is in our office today for follow-up of his open aneurysm,  repair of his abdominal aortic aneurysm and common iliac artery  aneurysms on March 31, 2008.  He looks quite good today, reports that he  has returned to his normal eating and normal bowel function.  He does  have the usual amount of diminished stamina following his surgery.  He  did have a complication of a prolonged ileus following his surgery and  had to have NG tube placed for decompression on 2 occasions and  eventually was able to have his ileus resolve and has resumed normal  eating.  Several days following his initial discharge from the hospital  he was readmitted with hypotension that was felt to be related to  increased dose of a beta-blocker.  This has resolved as well.  He  actually looks quite good today.  His abdominal incision is well-healed  and he has normal femoral pulses bilaterally.  He has lost a total 32  pounds and weighs approximately 200 pounds currently.  I explained that  the weight typically comes back within several months of surgery.  He is  encouraged to continue his activity without restrictions, aside from  heavy straining or heavy lifting.  I will see him again in 2 months for  final follow-up.  He will notify us should he develop any difficulty.   Larina Earthly, M.D.  Electronically Signed   TFE/MEDQ  D:  05/13/2008  T:  05/16/2008  Job:  [id number]   cc:   Thora Lance, M.D.  Lyn Records, M.D.

## 2011-03-12 NOTE — Consult Note (Signed)
NAMEABRHAM, MASLOWSKI NO.:  [id number]   MEDICAL RECORD NO.:  [id number]          PATIENT TYPE:  INP   LOCATION:  6705                         FACILITY:  [facility]   PHYSICIAN:  James L. Randa Evens, M.D. DATE OF BIRTH:  01-Dec-1940   DATE OF CONSULTATION:  12/04/2007  DATE OF DISCHARGE:                                 CONSULTATION   REQUESTING PHYSICIAN:  Thora Lance, M.D.   HISTORY AND PHYSICAL:  Mr. Caspers is a 70 year old man with a past medical  history of coronary artery disease; diabetes mellitus, type 2;  hyperlipidemia; and gastroesophageal reflux disease and others who was  admitted on December 03, 2007 for two days of nausea, vomiting, and  diarrhea.  He had a similar episode which lasted a few days  approximately 2 weeks ago of violent nausea and vomiting without  hematemesis, melena, or hematochezia.  He denied abdominal pain.  He had  no fevers or chills.  He also denied any sick contacts prior to that  first episode.  The current episode was described as exactly the same  except that he has mild soreness of his abdomen throughout.  He says his  wife had an approximately 24-hour gastrointestinal illness, after his  own first episode.  He mentions having had peanut butter and crackers  the day before each of the 2 episodes.  Today, he has had a normal bowel  movement this morning and is having no nausea.  He is eating clear  liquids without problems.   PAST MEDICAL HISTORY:  1. Coronary artery disease, status post MI remotely.  2. Diabetes mellitus, type 2, well controlled.  3. Dyslipidemia.  4. Obstructive sleep apnea on CPAP.  5. Spinal stenosis with chronic pain and numbness of the lower      extremities, previously followed by Dr. Danielle Dess.  6. Gastroesophageal reflux disease.  7. Peyronie's disease  8. Shingles.  9. History of arthralgias with negative workup.  10.Colonoscopy in 2005 by Dr. Sherin Quarry showing only diverticulosis.   PAST SURGICAL  HISTORY:  1. Vasectomy.  2. Lumbar diskectomy in 2003.   FAMILY HISTORY:  Father with rheumatoid arthritis, diabetes, heart  disease, and renal disease.  Mother with coronary artery disease,  diabetes mellitus.  And, sister with coronary artery disease.   SOCIAL HISTORY:  He is a former smoker, quit in 1998.  He used to smoke  a half pack a day for many years.  He drinks occasional alcohol.  He is  married with 3 children.  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   HOME MEDICATIONS:  1. Altace 10 mg p.o. daily.  2. Protonix 40 mg p.o. daily.  3. Metoprolol 25 mg p.o. b.i.d.  4. Zocor 40 mg daily.  5. Actos 30 mg daily.  6. Aspirin 81 mg daily.  7. Multivitamin daily.  8. Viagra p.r.n.   REVIEW OF SYSTEMS:  Negative for fever or chills.  Negative for chest  pain or dyspnea.  Positive for arthralgias, these are chronic.   PHYSICAL  EXAMINATION:  VITAL SIGNS:  Blood pressure 100/60, temperature  97.7, heart rate 77, respiratory rate 15, saturation 95% on room air.  CBG from 161-168.  GENERAL:  He is in no acute distress sitting in bed.  HEENT:  Pupils equal and reactive to light.  Nonicteric.  HEART:  Regular rate and rhythm.  No murmurs, rubs, or gallops.  LUNGS:  Clear to auscultation bilaterally with good air movement.  ABDOMEN:  Bowel sounds positive.  Soft with minimal diffuse soreness.  However, there was no guarding or rebound.  No hepatosplenomegaly.  SKIN:  No rash.  JOINTS:  No effusions or deformities.   LABORATORY:  WBC 9.1, hemoglobin 14.9, platelets 163.  UA was  unremarkable.  PT 14.5, INR 1.1, PTT 27.  A C-MET on admission showed a  sodium of 139, potassium 3.7, chloride 107, CO2 25, BUN 18, creatinine  0.8, glucose 173.  Bilirubin 1.5, alkaline phosphatase 62, AST 22, ALT  28, protein 6.2, albumin 3.5, calcium 9.2.  Lipase 16 and amylase 59.  The B-MET today shows a sodium of 138, potassium 3, chloride 109,  CO2  26, BUN 14, creatinine 0.6, glucose 140.   ASSESSMENT/PLAN:  Nausea, vomiting, diarrhea.  After reviewing his labs  the most likely etiology is a gastroenteritis either viral or bacterial,  although it is unusual to have recurrence of symptoms within 2 weeks.  He is not at risk for Clostridium difficile but I will send his stool  for culture.  This also could be a variant of presentation of  gallstones.  I doubt there is a correlation between these two episodes  and his chronic arthralgias since they were two self-limited episodes of  short duration.  He has never had any gastrointestinal problems before.  However, if this problem recurs again it may be worthwhile to repeat a  colonoscopy or flexible sigmoidoscopy.  A CT of the abdomen with oral  contrast is pending.  For now continue Imodium and Phenergan as needed.  We will follow up with CT scan results.      Dellia Beckwith, M.D.      ______________________________  Llana Aliment. Randa Evens, M.D.    VD/MEDQ  D:  12/04/2007  T:  12/06/2007  Job:  [id number]   cc:   Thora Lance, M.D.

## 2011-03-15 NOTE — Discharge Summary (Signed)
NAMEKEAVON, SENSING NO.:  [id number]   MEDICAL RECORD NO.:  [id number]          PATIENT TYPE:  INP   LOCATION:  3731                         FACILITY:  [facility]   PHYSICIAN:  Lyn Records, M.D.   DATE OF BIRTH:  [birth year]   DATE OF ADMISSION:  12/12/2007  DATE OF DISCHARGE:  12/13/2007                               DISCHARGE SUMMARY   DISCHARGE DIAGNOSIS:  1. Transient atrial fibrillation, now in normal sinus rhythm.  2. Minimal troponin elevation, felt secondary to atrial fibrillation.  3. Nausea.  4. Known coronary artery disease, history of myocardial infarction.  5. Diabetes mellitus.  6. Hyperlipidemia.  7. Obstructive sleep apnea.  8. Gastroesophageal reflux disease.  9. Spinal stenosis.  10.Diverticulosis illness, more specifically nausea, vomiting and      diarrhea.  These have occurred intermittently.   On the morning of admission, he had a third episode of the same type of  illness where he had nausea, vomiting and diarrhea.  He was feeling  poorly and went to the [place] walk-in clinic.  He was found at that time  to have a fast heart rate and was sent to the emergency room.  He was  found to be in atrial fibrillation with RVR.  In the emergency room, he  was started on Cardizem and easily converted back to normal sinus  rhythm.   Instead of keeping him on Cardizem, we doubled his metoprolol and kept  him in the hospital overnight.  His troponin was mildly elevated at  0.16, and this was felt to be elevated secondary to his A Fib with RVR.  He was kept in the hospital overnight and sent home the following day.  Arrangements were made for the patient to be seen as an inpatient for GI  urgency but to be seen as an outpatient.  The patient is to call the  gastroenterology office, Dr. Randa Evens' office, on Monday to set up an  appointment/endoscopy.   Otherwise the patient is discharged to home in stable condition.   Lab work during this  hospitalization included a white count 6.0,  hemoglobin 13.9, hematocrit 39.4, platelets 184, sodium 136, potassium  3.7, BUN 9, creatinine 0.62.  LFTs normal.  TSH 0.449 with a T4 of 7.4.   DISCHARGE MEDICATIONS:  1. Nexium 40 mg a day.  2. Lipitor 10 mg a day.  3. Actos 30 mg daily.  4. Aspirin.   The patient is to call Dr. Randa Evens for an appointment.  Follow with Dr.  Katrinka Blazing and call for appointment.  Increase activity slowly.  Call for  further questions or concerns.   I personally encountered this patient during the hospitalization and  am  dictating a discharge summary.      Guy Franco, P.A.      Lyn Records, M.D.  Electronically Signed    LB/MEDQ  D:  12/31/2007  T:  12/31/2007  Job:  [id number]

## 2011-04-23 IMAGING — CR DG ABDOMEN 1V
2 series · 2 of 2 positions shown · non-contrast
Comparison: 04/14/2008

CLINICAL DATA: Abdominal pain.  Weakness.  Nausea.

ABDOMEN - 1 VIEW

[t abdomen supine (1 of 2)]
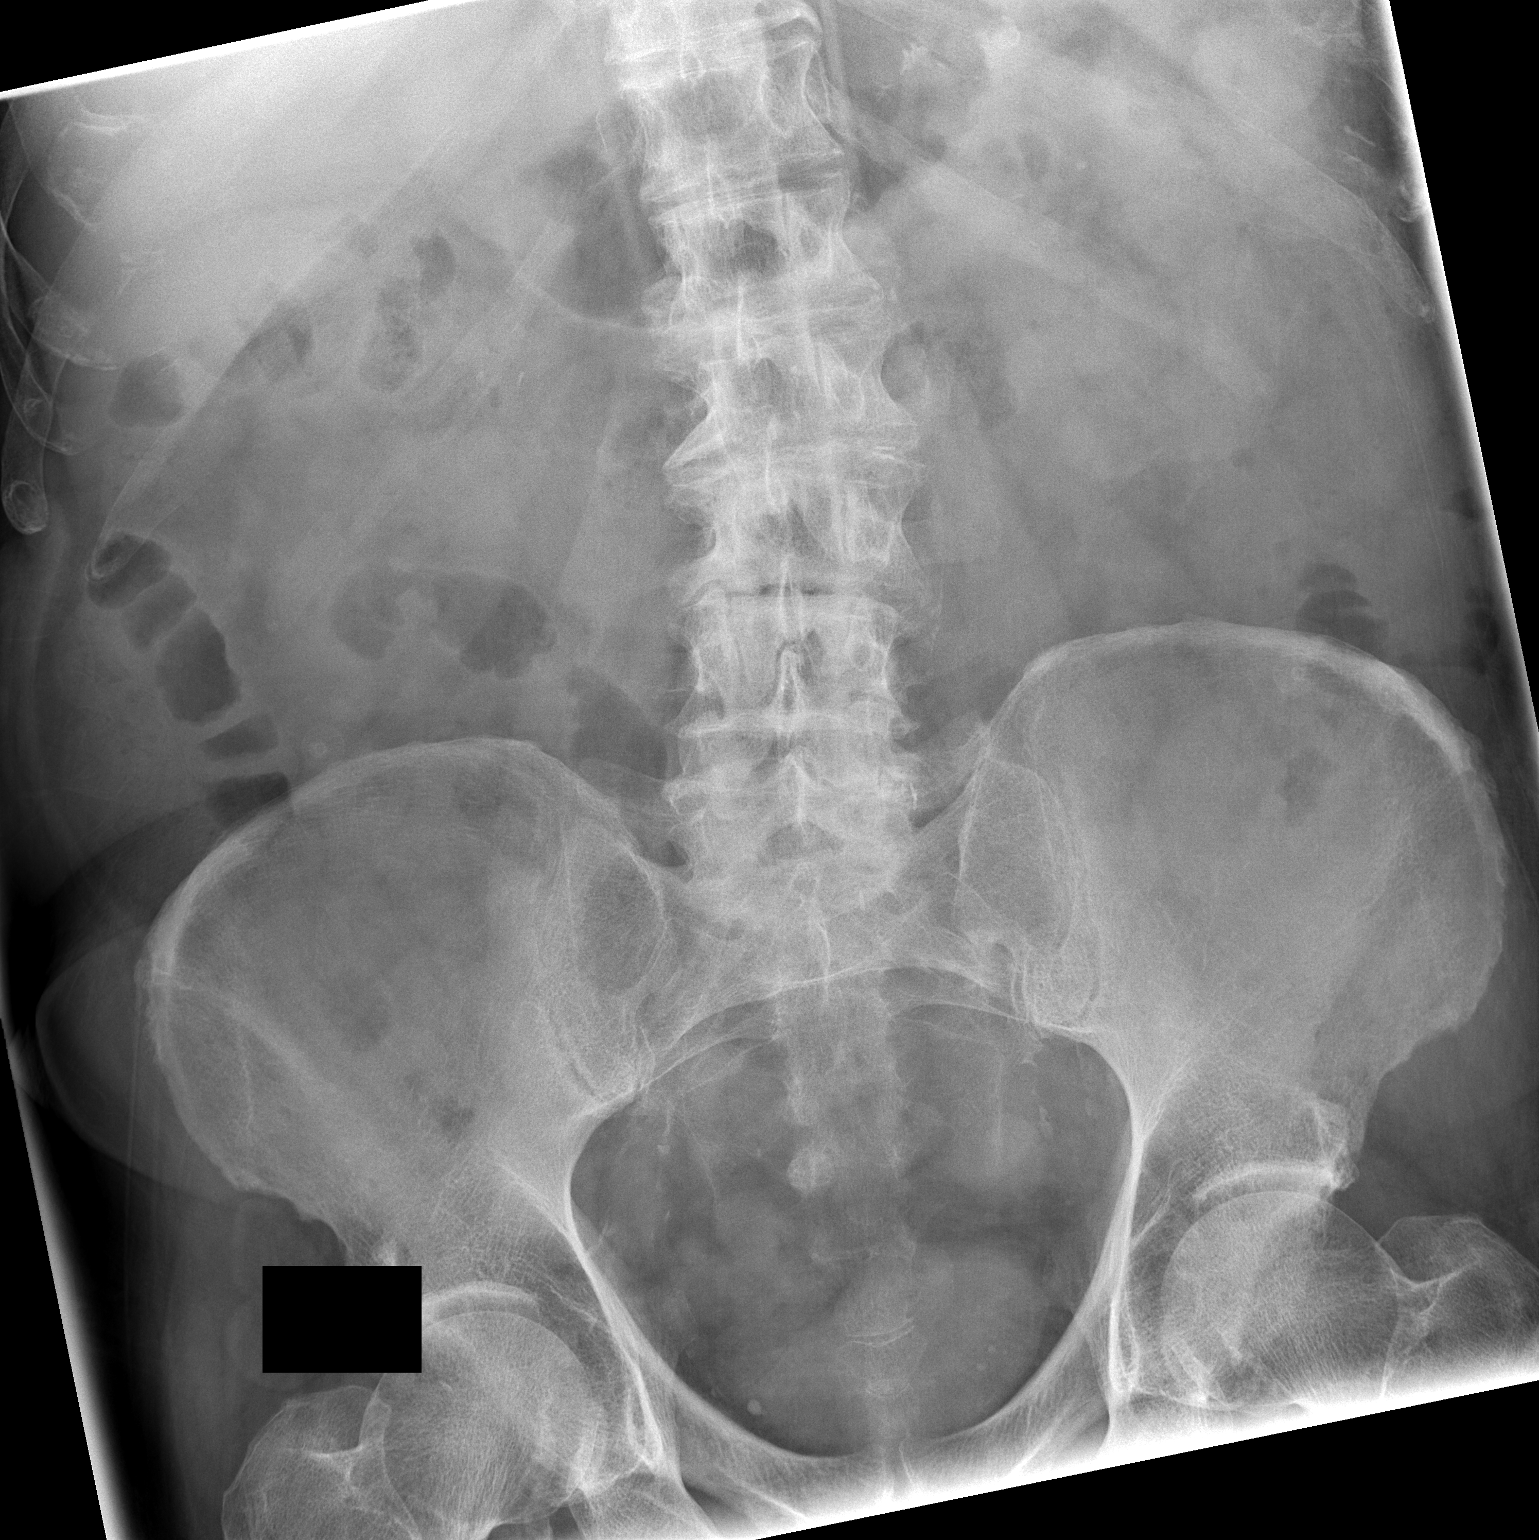

[t abdomen supine (2 of 2)]
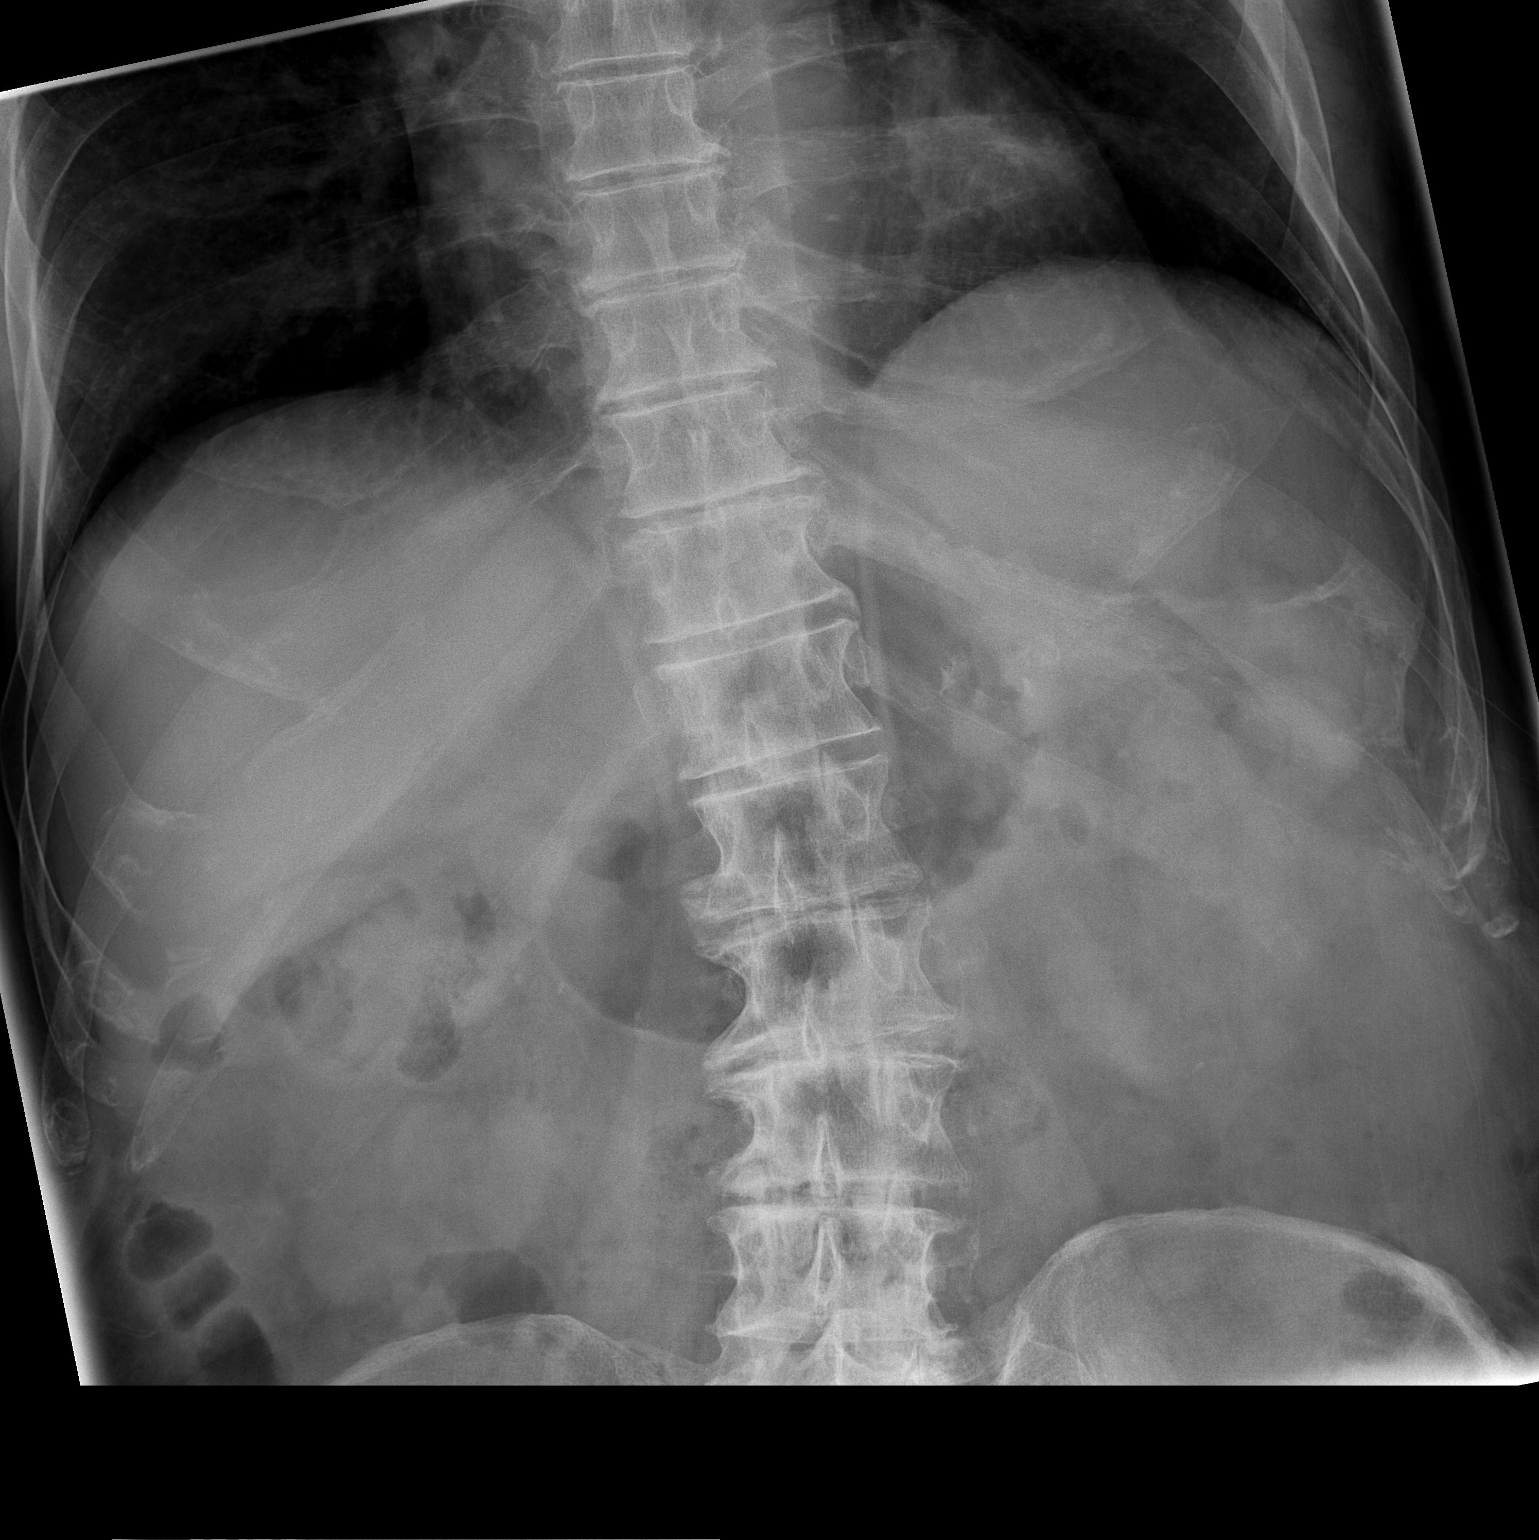

[2 of 2 positions shown; findings below may reference images not displayed]

FINDINGS: Bowel gas pattern is unremarkable without evidence of
ileus, obstruction or free air.  No worrisome soft tissue
calcifications. There are a few small renal calculi.  Chronic
degenerative disease of the spine.
IMPRESSION: No active process evident.

## 2011-04-23 IMAGING — CR DG CHEST 2V
2 series · 2 of 2 positions shown · non-contrast
Comparison: 04/24/2008.  Multiple previous films.

CLINICAL DATA: Nausea.  Diarrhea.Weakness.

CHEST - 2 VIEW

[w chest pa]
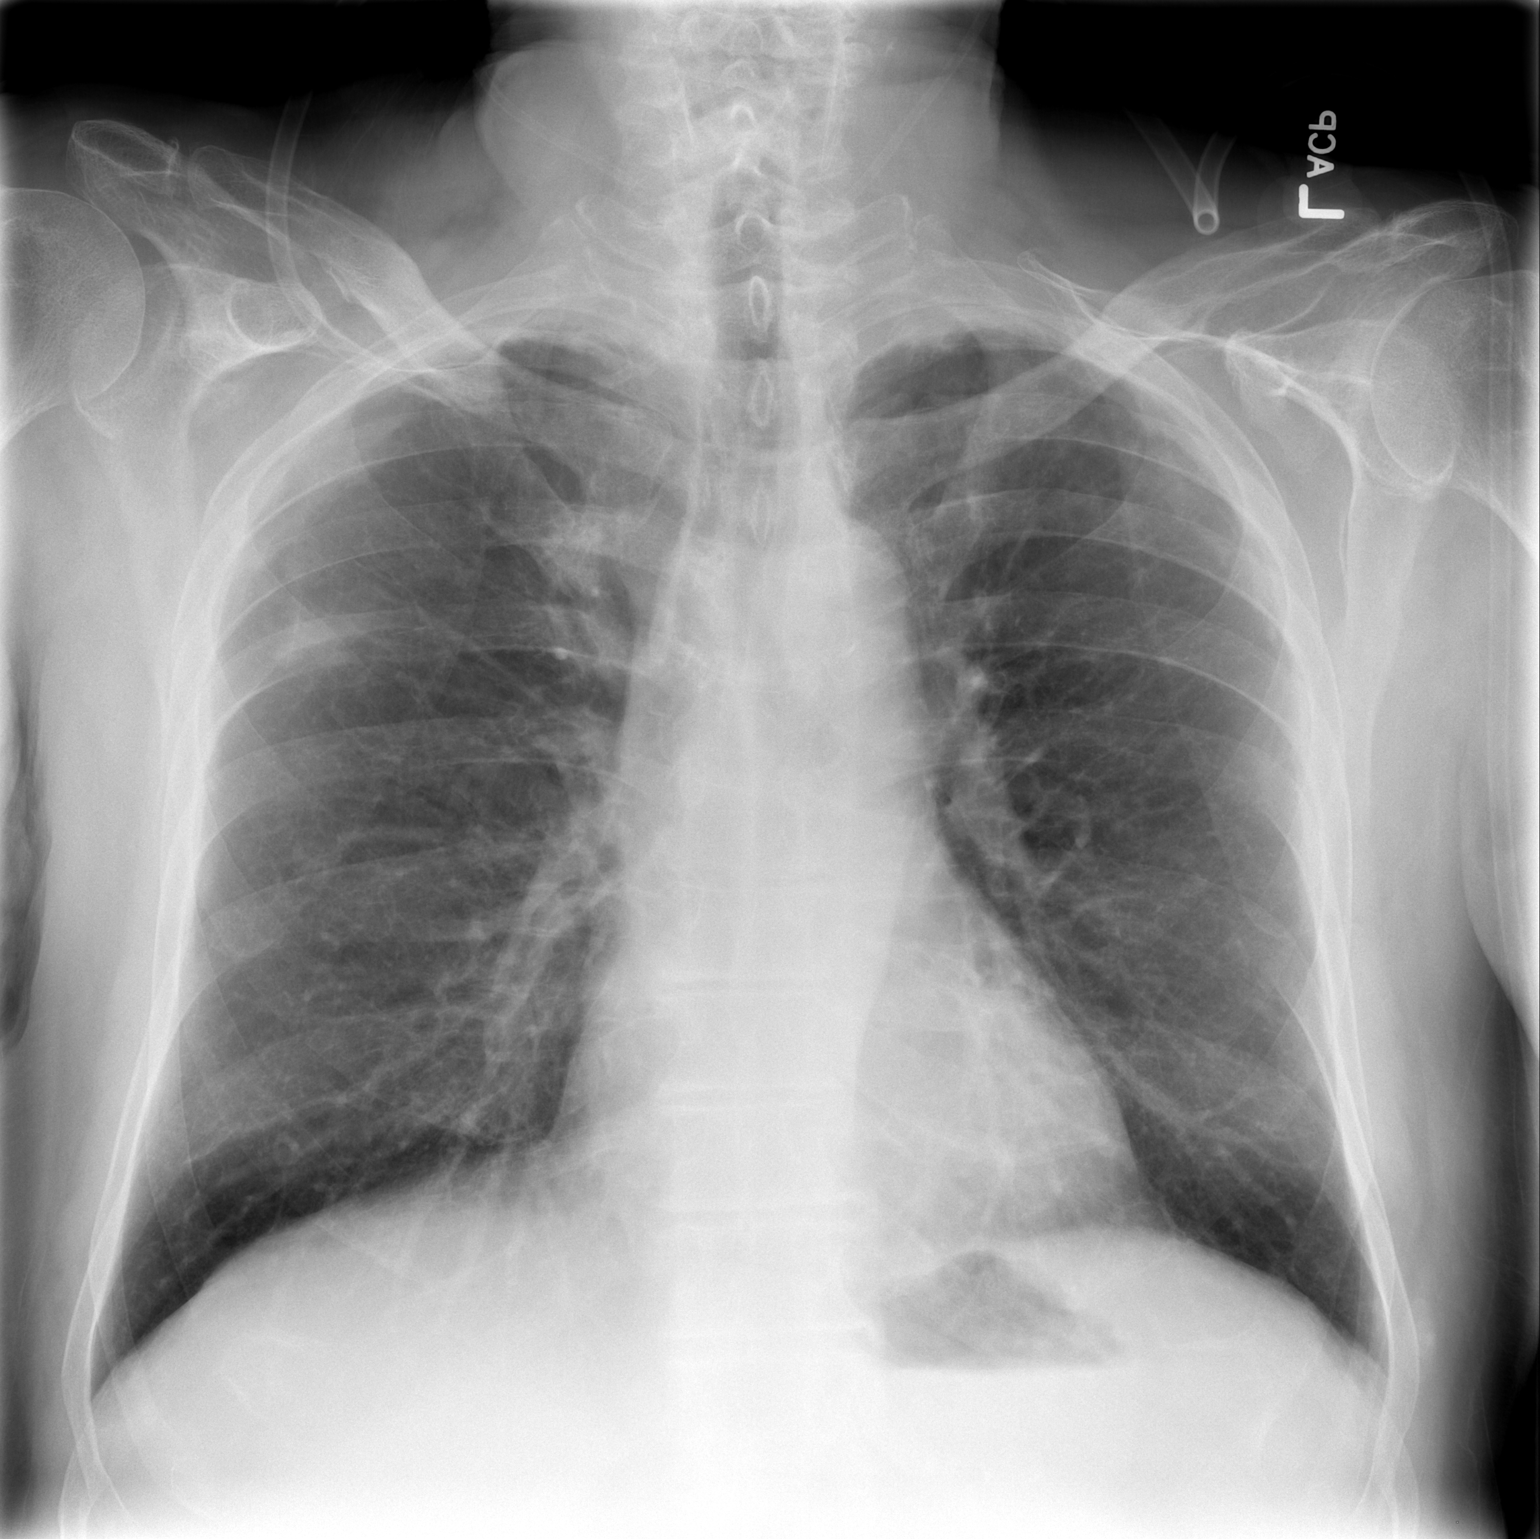

[w chest lat]
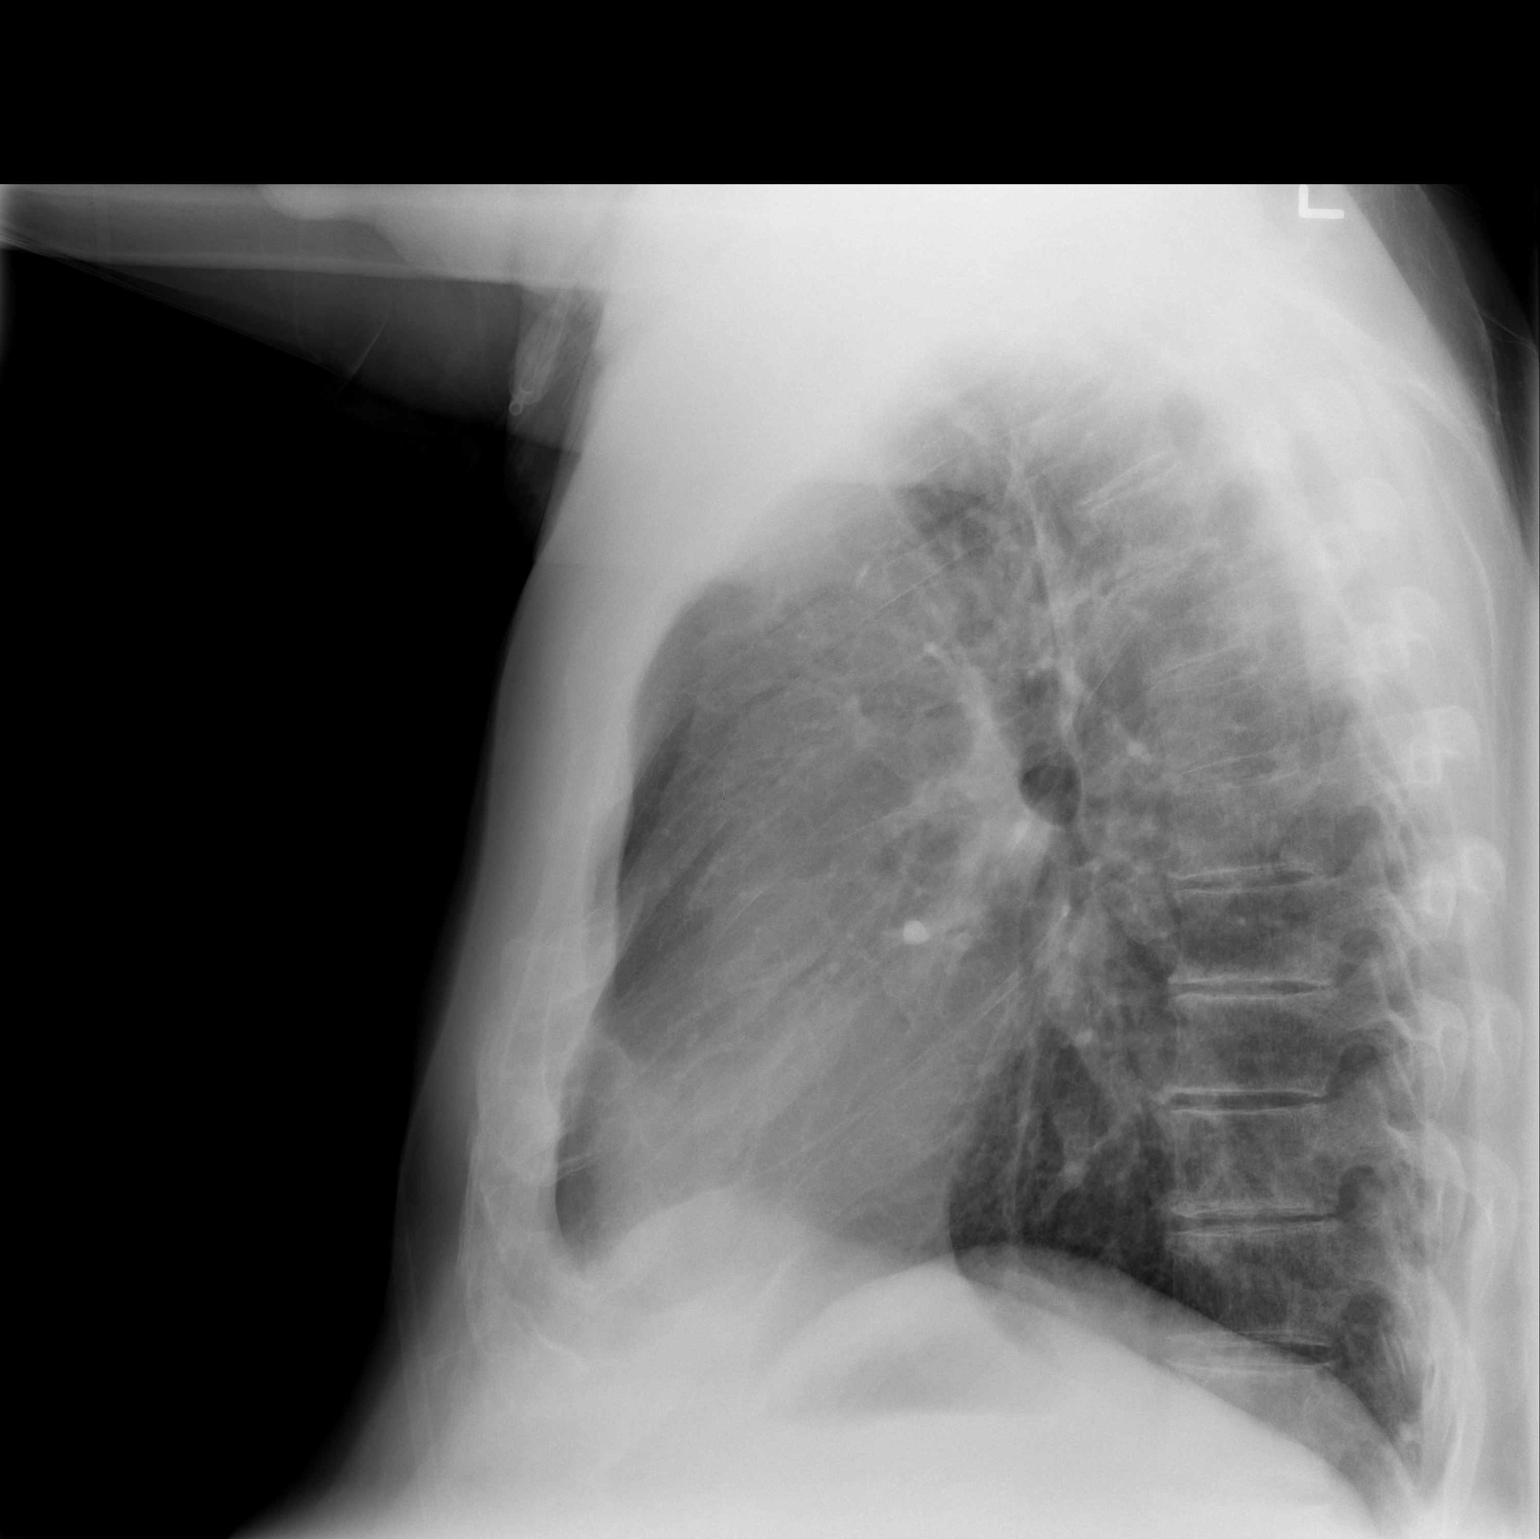

[2 of 2 positions shown; findings below may reference images not displayed]

FINDINGS: Heart size is normal.  The mediastinum is unremarkable.
There is mild pleural and parenchymal scarring at the left apex.
There is moderate pleural and parenchymal scarring at the right
apex.  Compared to multiple previous studies, I think this is
stable allowing for technical differences.  No sign of active
infiltrate, developing mass, effusion or collapse.  No significant
bony finding.
IMPRESSION: No active disease.  Pleural and parenchymal scarring at the apices
and upper lobes, right worse than left.  This varies rather
considerably based on technical differences, but I believe is
unchanged from a comparable two-view study on 03/29/2008.

## 2011-05-25 IMAGING — US US ABDOMEN COMPLETE
1 series · 14 of 25 positions shown · non-contrast
Comparison: 01/20/2009

CLINICAL DATA: Nausea, vomiting, fever.

COMPLETE ABDOMINAL ULTRASOUND

[Series 1: us abdomen complete · 0.29mm/px · 14 of 74 slices shown]
[im 1/74]
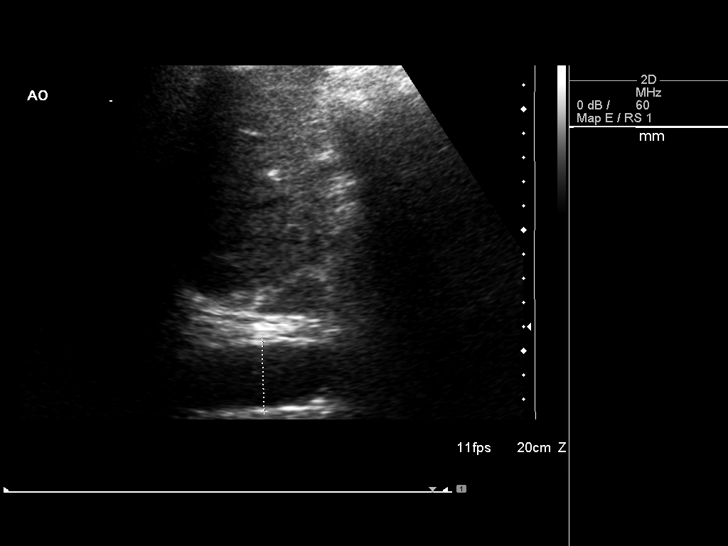
[im 7/74]
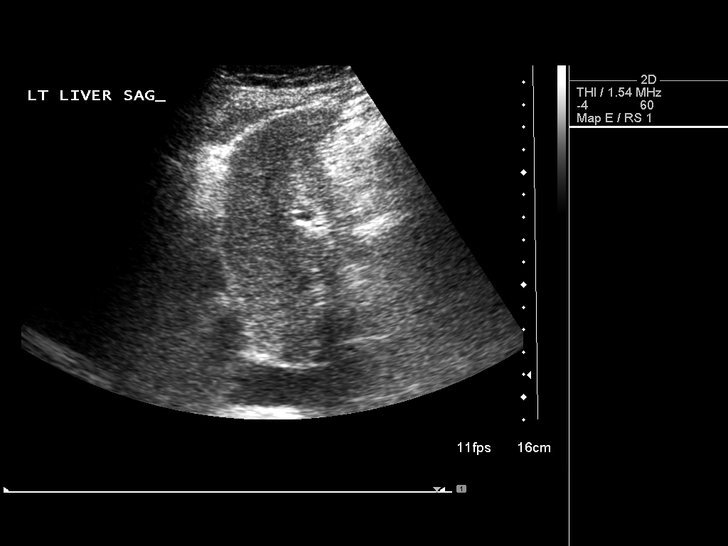
[im 13/74]
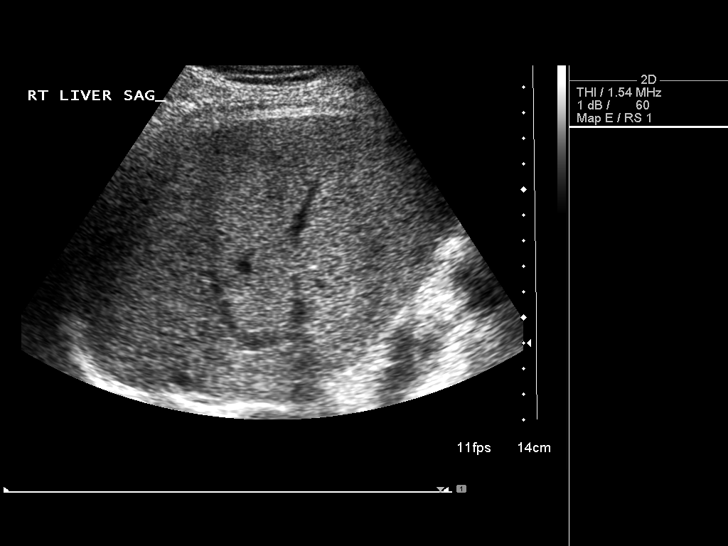
[im 19/74]
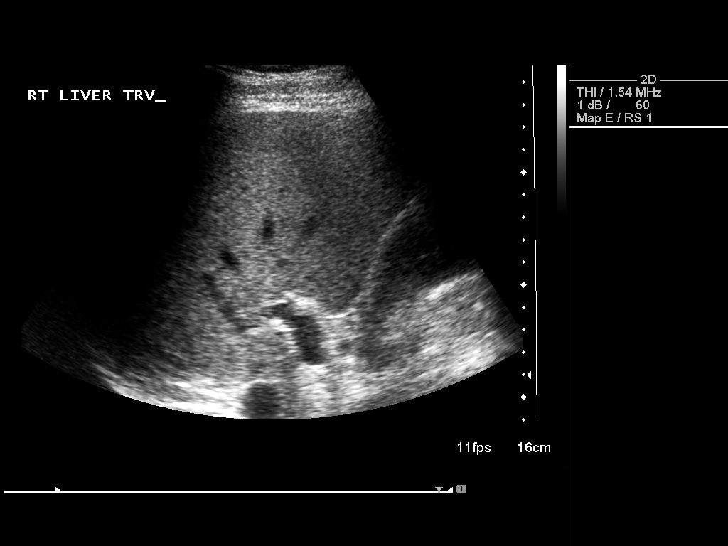
[im 25/74]
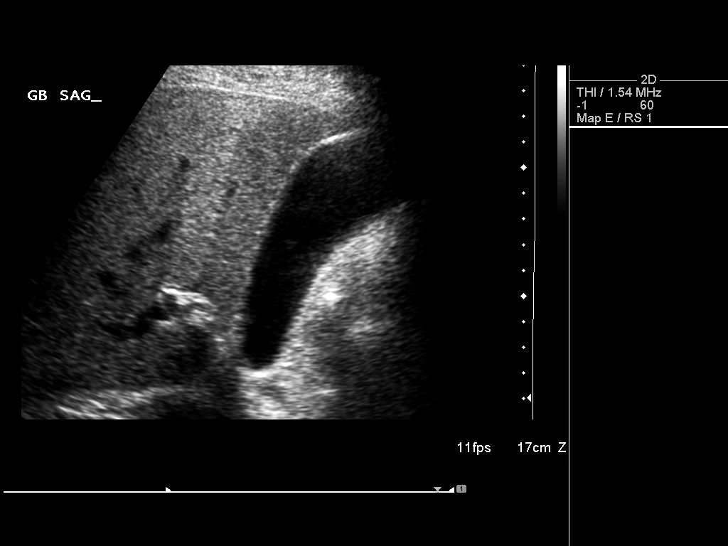
[im 28/74]
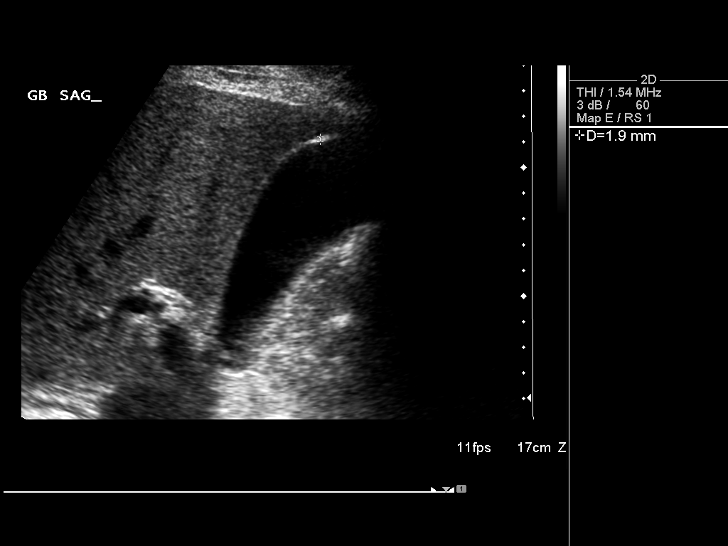
[im 34/74]
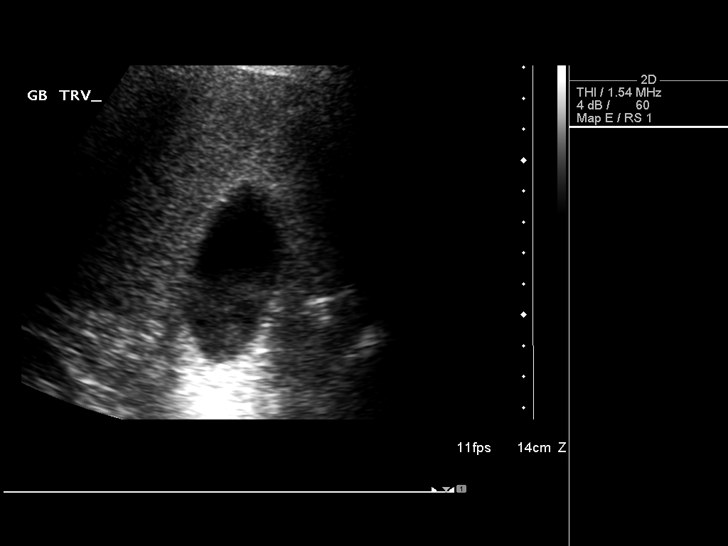
[im 40/74]
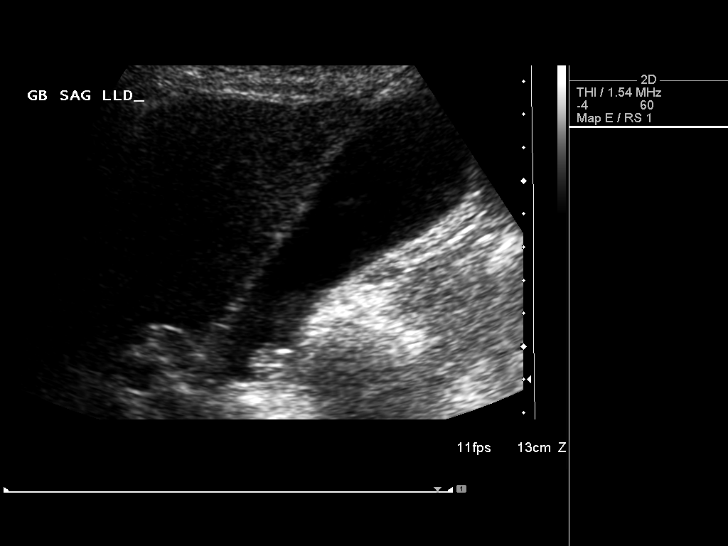
[im 46/74]
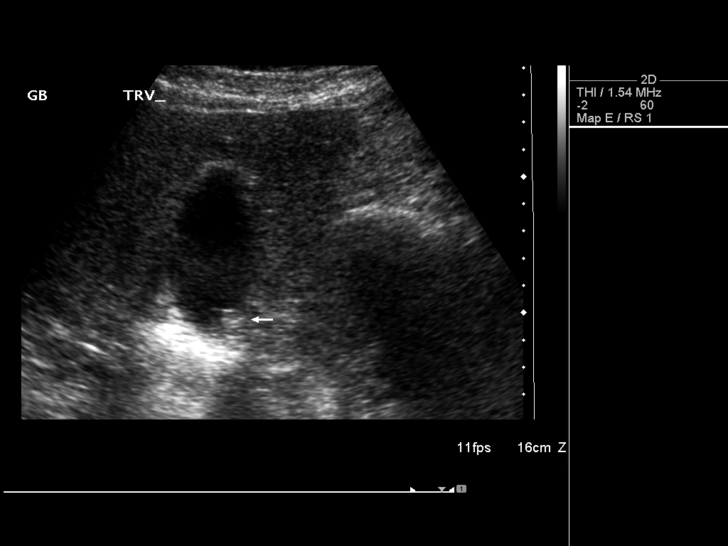
[im 49/74]
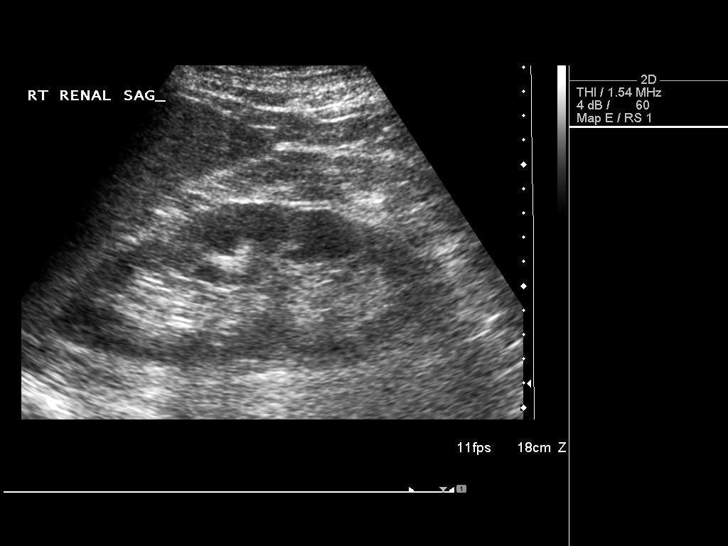
[im 55/74]
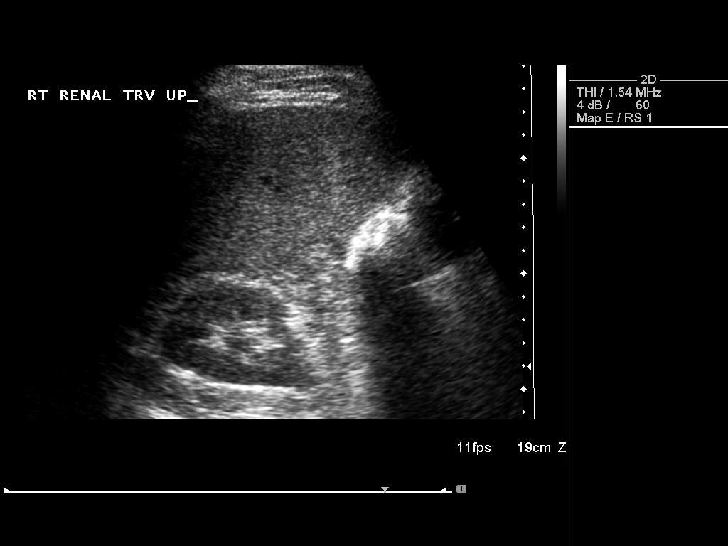
[im 61/74]
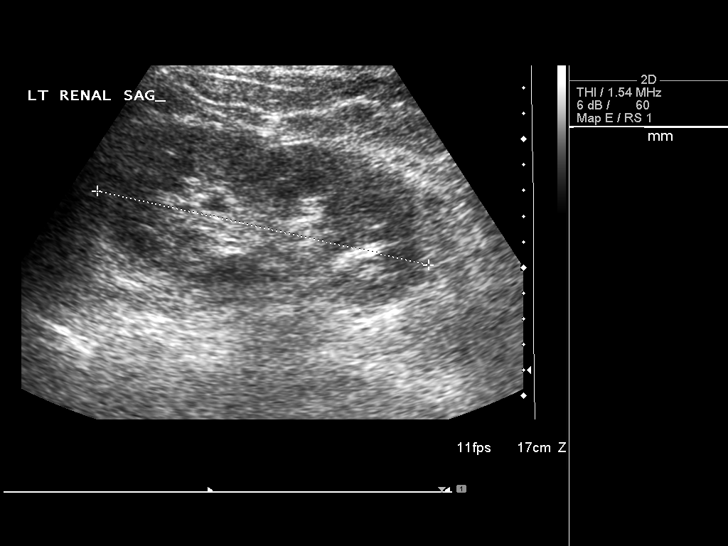
[im 67/74]
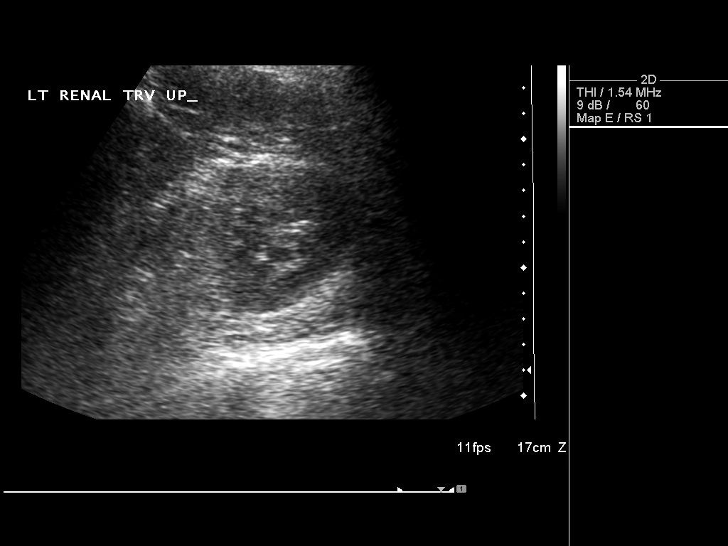
[im 74/74]
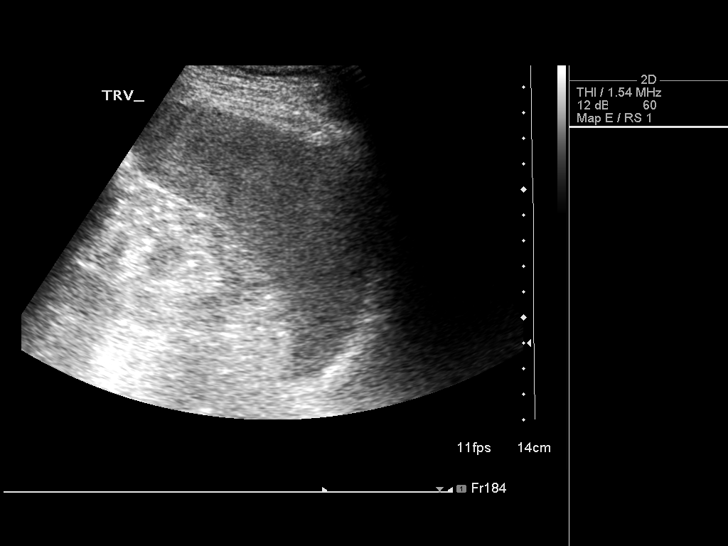

[14 of 25 positions shown; findings below may reference images not displayed]

FINDINGS: Gallbladder:  There is sludge noted within the gallbladder.  No
gallbladder wall thickening or pericholecystic fluid.

Common bile duct:   Within normal limits in caliber.

Liver:  No focal lesion identified.  Within normal limits in
parenchymal echogenicity.

IVC:  Appears normal.

Pancreas:  Limited visualization due to overlying bowel gas.

Spleen:  Within normal limits in size and echotexture.

Right Kidney:   Normal in size and parenchymal echogenicity.  No
evidence of mass or hydronephrosis.

Left Kidney:  Normal in size and parenchymal echogenicity.  No
evidence of mass or hydronephrosis.

Abdominal aorta:  Maximum aortic diameter is 3 cm proximally.
IMPRESSION: Sludge noted within the gallbladder.  No wall thickening or
sonographic changes of acute cholecystitis.

## 2011-05-26 IMAGING — MR MR MRCP
6 of 11 series · 24 of 48 positions shown · non-contrast
Comparison: Ultrasound dated 10/18/2009

CLINICAL DATA: Nausea and vomiting.  Sludge in the gallbladder on
the ultrasound dated 10/18/2009

MRI ABDOMEN WITHOUT CONTRAST (MRCP)
TECHNIQUE: Multiplanar multisequence MR imaging of the abdomen was
performed, including heavily T2-weighted images of the biliary and
pancreatic ducts.  Three-dimensional MR images were rendered by
post processing of the original MR data.

[Series 4: T1 · axial · 6.5mm · 1.56mm/px · z∈[-120,+74]mm · 3 of 24 slices shown]
[im 1/24]
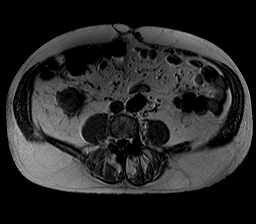
[im 12/24]
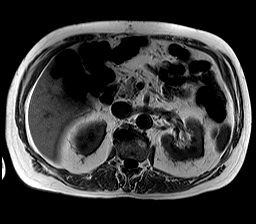
[im 24/24]
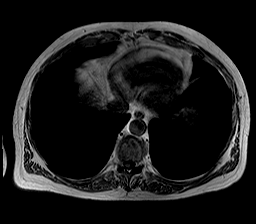

[Series 5: bSSFP · axial · 5.5mm · 0.78mm/px · z∈[-133,+88]mm · 4 of 36 slices shown (1 of 2)]
[im 1/36]
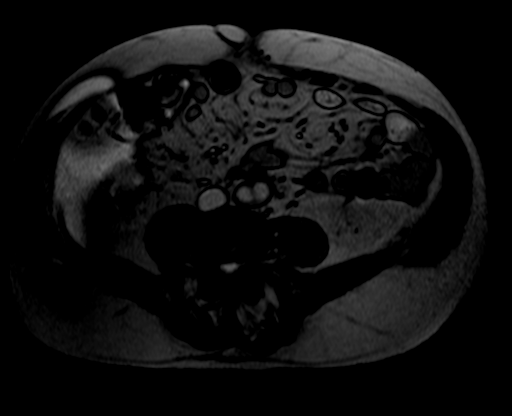
[im 12/36]
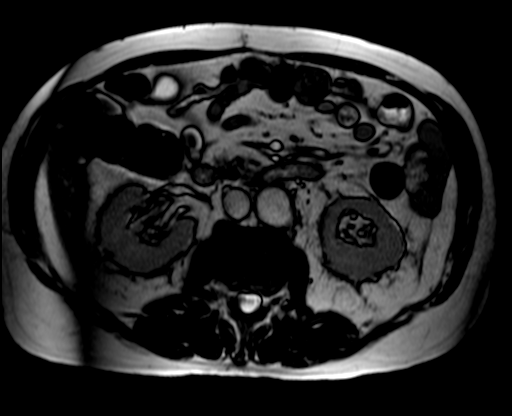
[im 24/36]
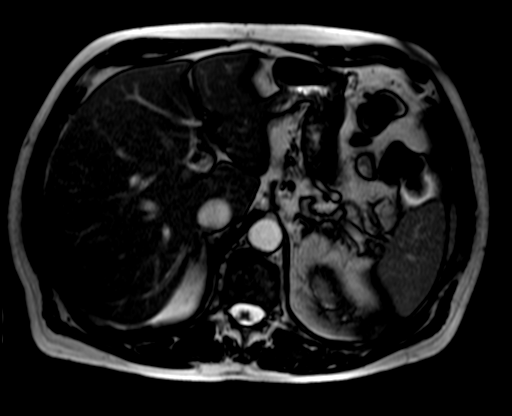
[im 36/36]
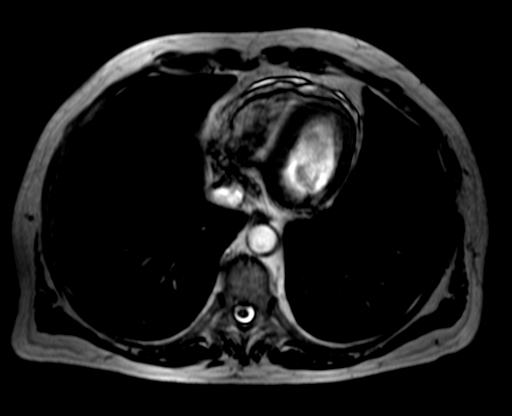

[Series 6: bSSFP · coronal · 6.0mm · 0.78mm/px · 3 of 26 slices shown (2 of 2)]
[im 1/26]
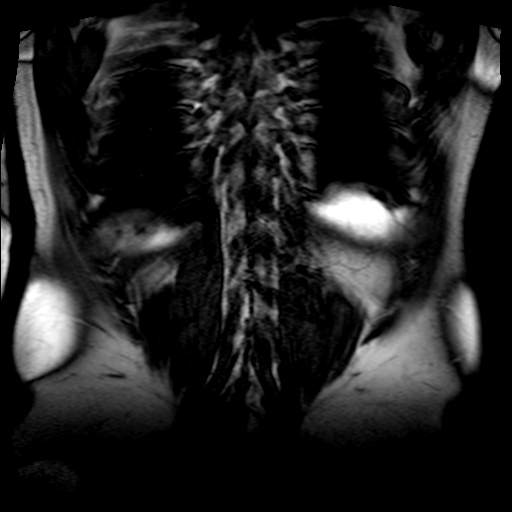
[im 13/26]
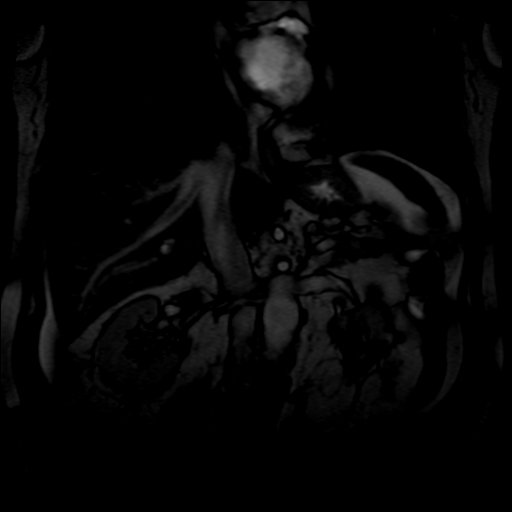
[im 26/26]
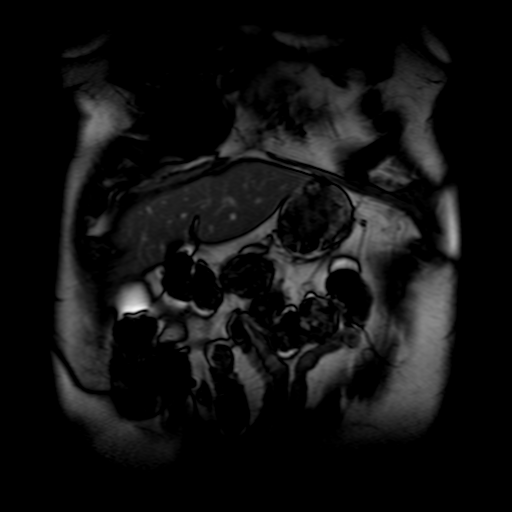

[Series 12: T1 dynamic fat-sat · axial · 3.2mm · 0.78mm/px · z∈[-116,+85]mm · 8 of 64 slices shown]
[im 1/64]
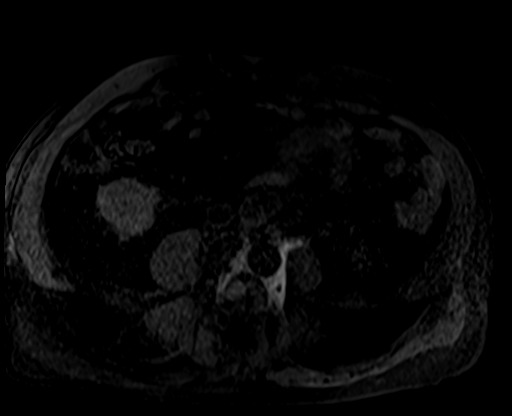
[im 10/64]
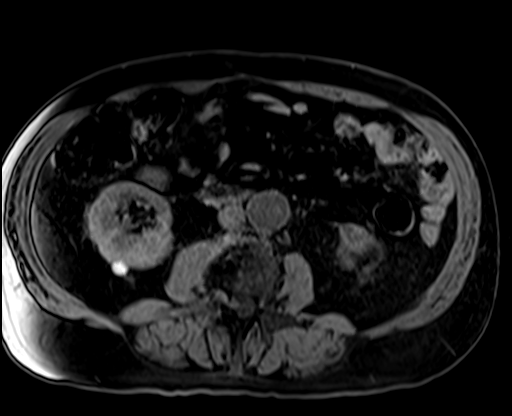
[im 19/64]
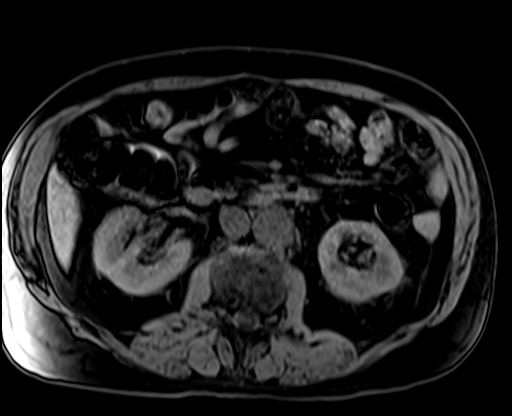
[im 28/64]
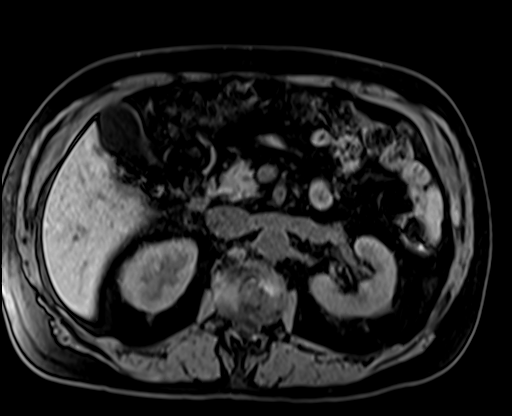
[im 37/64]
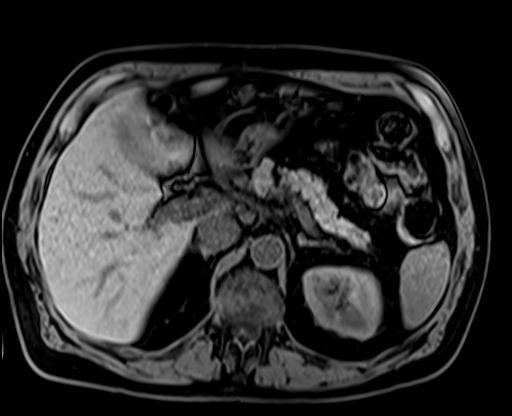
[im 46/64]
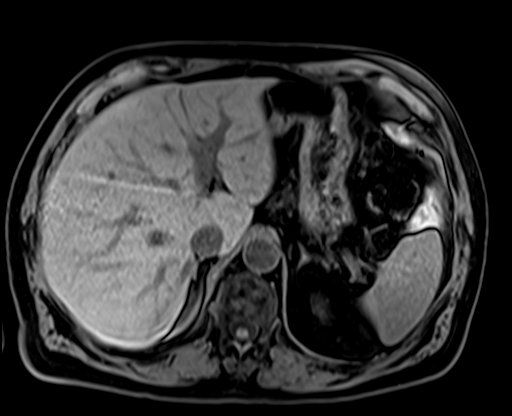
[im 55/64]
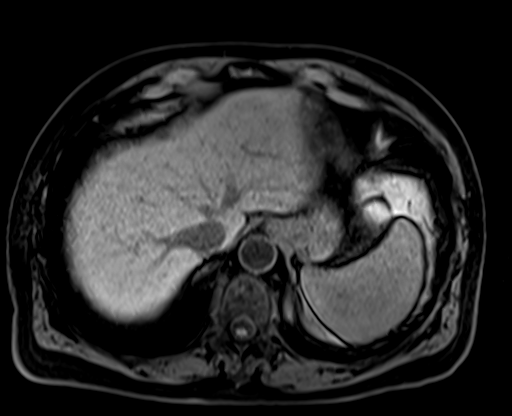
[im 64/64]
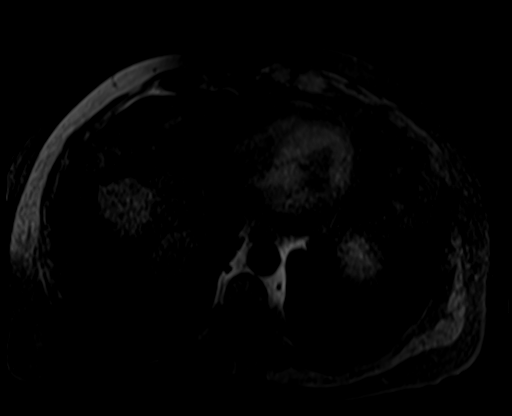

[Series 13: T2 · axial · 5.0mm · 0.74mm/px · z∈[-111,+79]mm · 4 of 34 slices shown (1 of 2)]
[im 1/34]
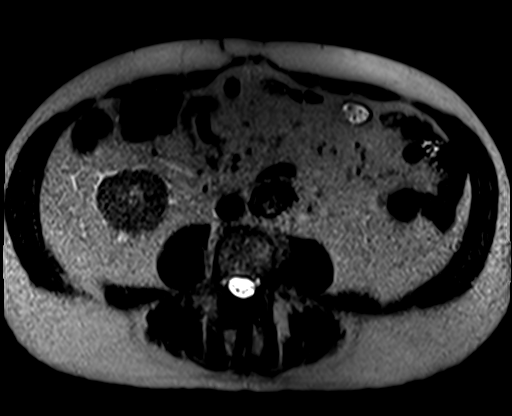
[im 12/34]
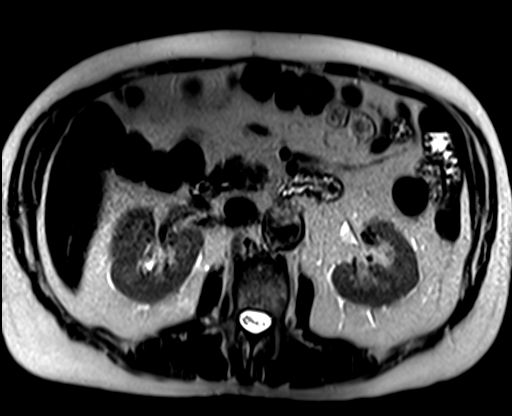
[im 23/34]
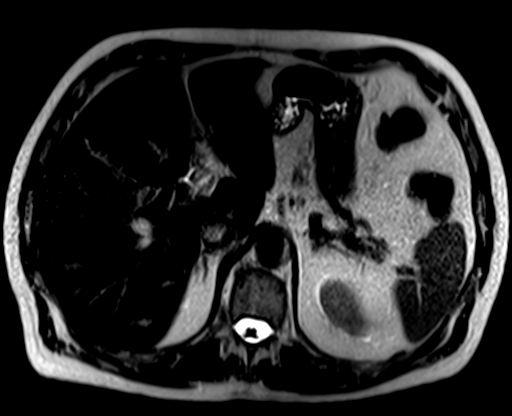
[im 34/34]
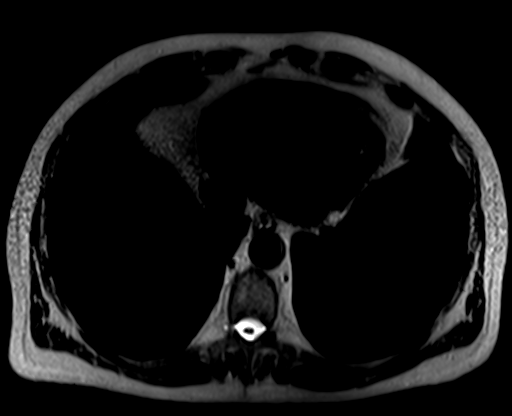

[Series 14: T2 · coronal · 5.0mm · 0.74mm/px · 2 of 30 slices shown (2 of 2)]
[im 1/30]
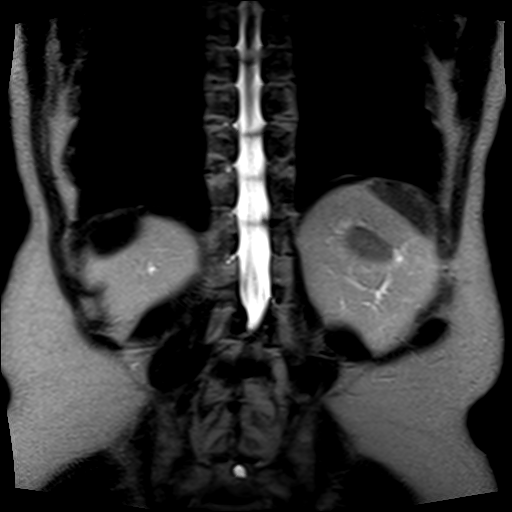
[im 10/30]
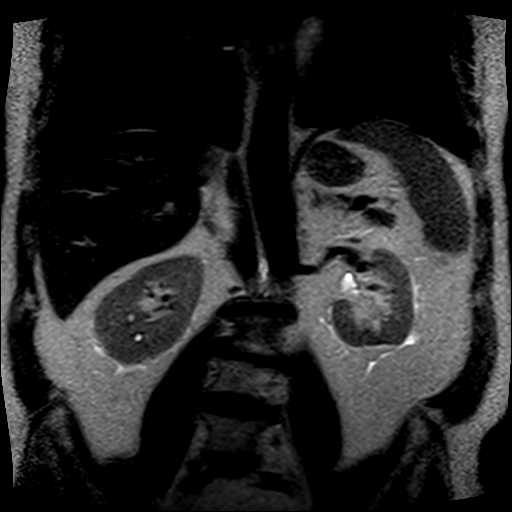

[24 of 48 positions shown; findings below may reference images not displayed]

FINDINGS: The hepatic ducts of the pancreatic duct are normal.
Multiple sequences demonstrate what appear to be multiple tiny
stones and a small amount of sludge in the dependent portion of the
gallbladder.  The gallbladder wall is not thickened of the
gallbladder is not distended.

Liver parenchyma, spleen, pancreas, and adrenal glands are normal.

There is a 14 mm proteinaceous cyst in the posterior aspect of the
midportion of the right kidney.  There is a tiny cyst in the upper
pole of the right kidney.

There is no ascites or mass.  The patient has had an aortobifemoral
bypass graft for aneurysm repair.

There are no dilated loops of bowel.
IMPRESSION: 1.  There are several tiny stones as well some sludge in the
gallbladder.
2.  The biliary tree and pancreatic duct are normal.
3.  Otherwise benign-appearing abdomen.

## 2011-06-30 IMAGING — RF DG CHOLANGIOGRAM OPERATIVE
1 series · 9 of 9 positions shown · non-contrast
Comparison: MRCP 10/19/2009

CLINICAL DATA: Gallstones.

INTRAOPERATIVE CHOLANGIOGRAM

[Series 1: run · 3 acquisitions, 9 frames shown]
[im 1/3]
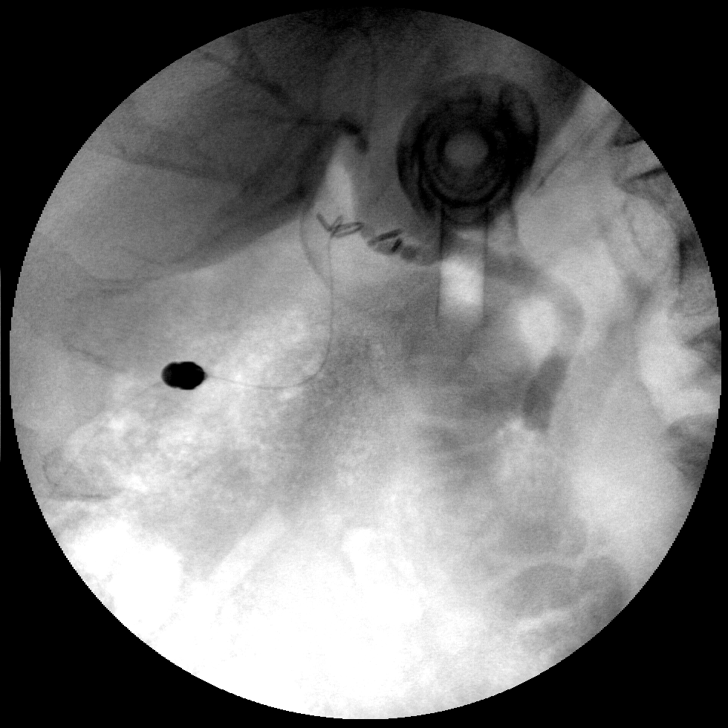
[im 1/3]
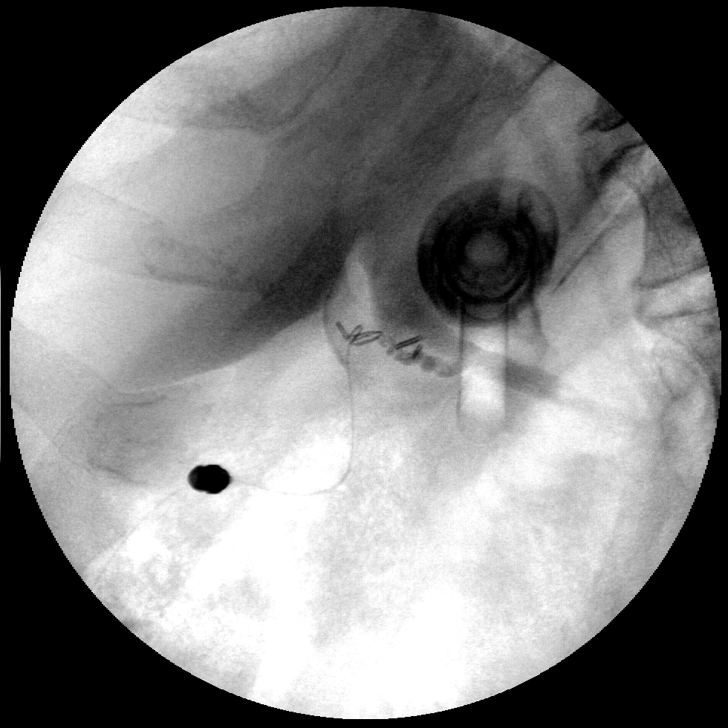
[im 1/3]
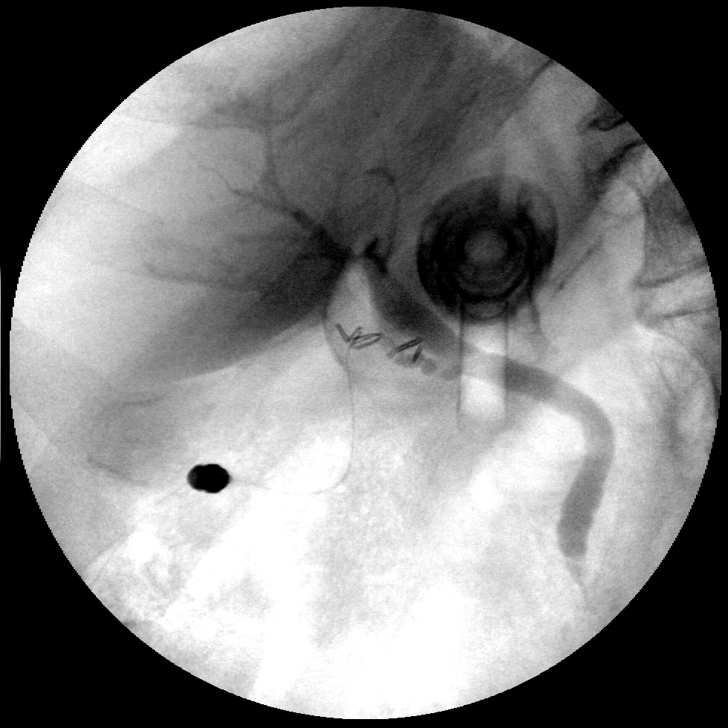
[im 1/3]
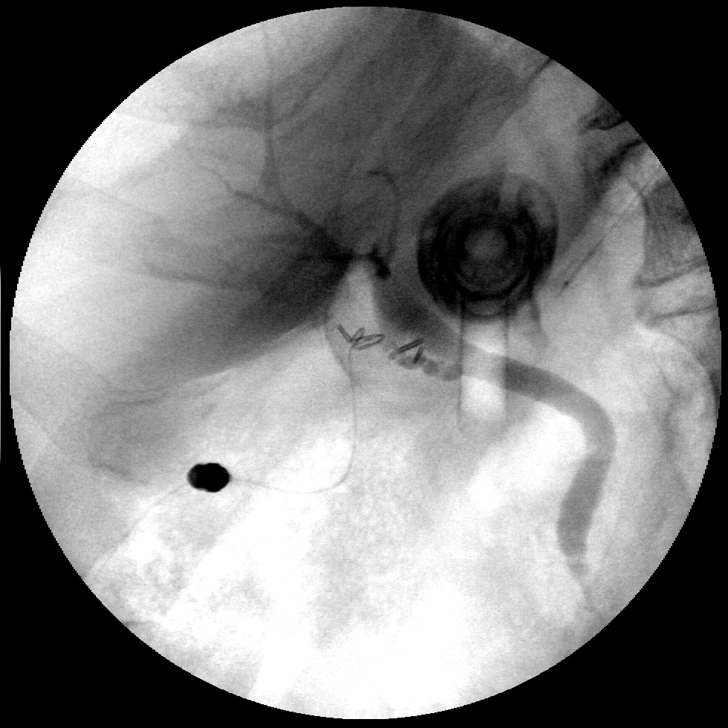
[im 2/3]
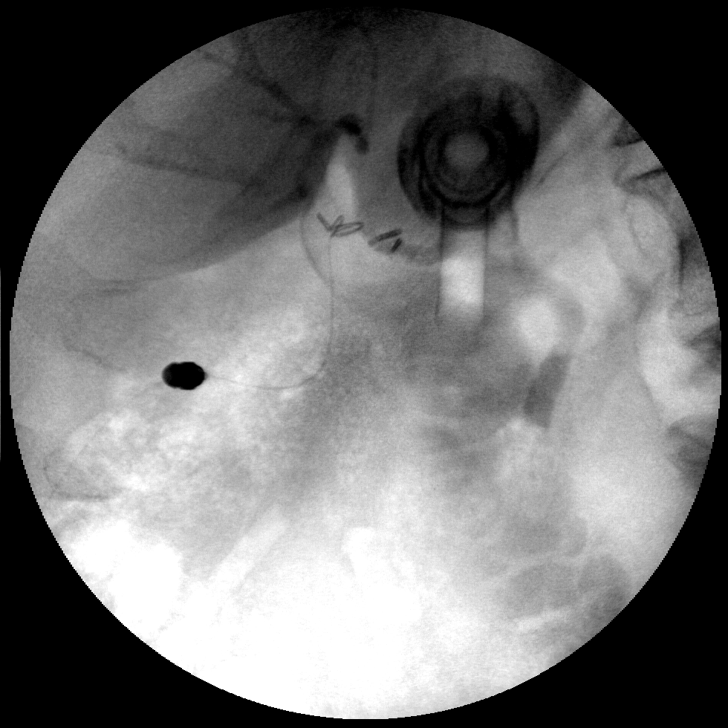
[im 2/3]
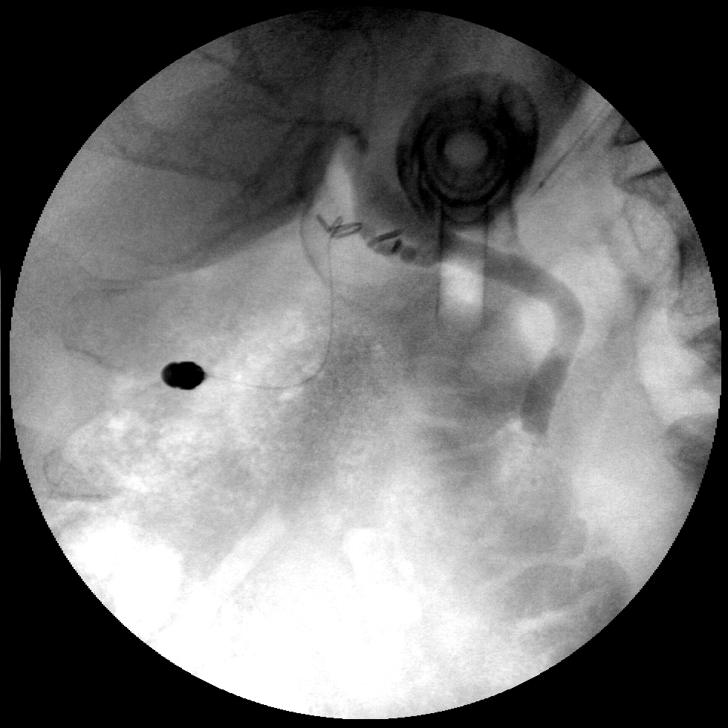
[im 2/3]
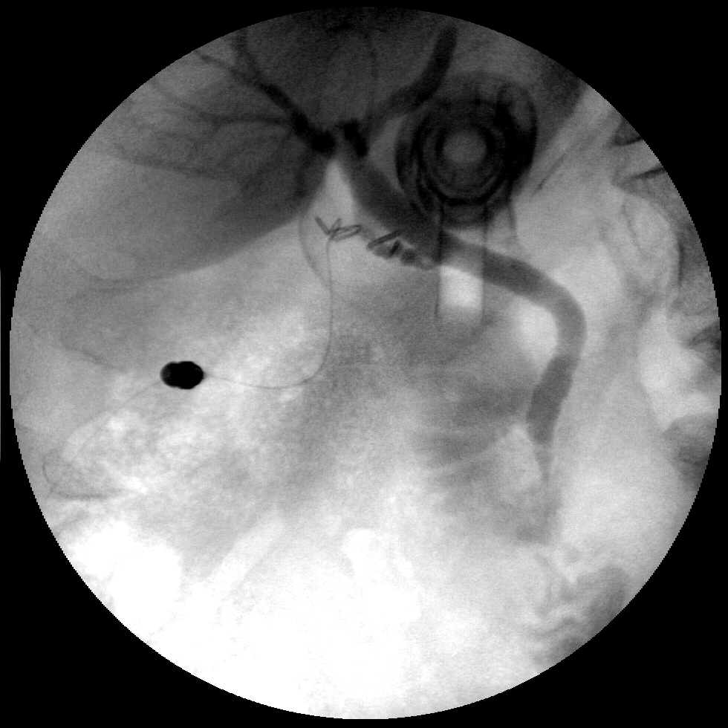
[im 2/3]
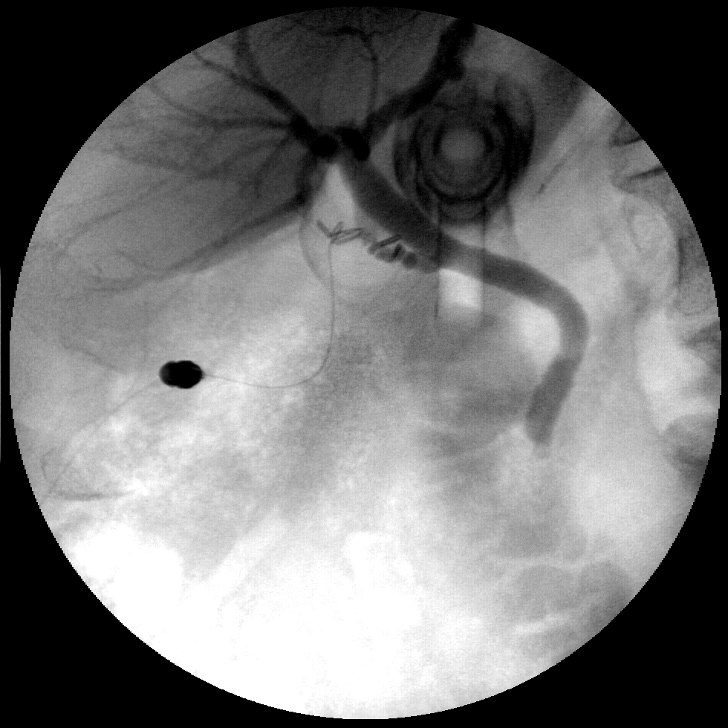
[im 3/3]
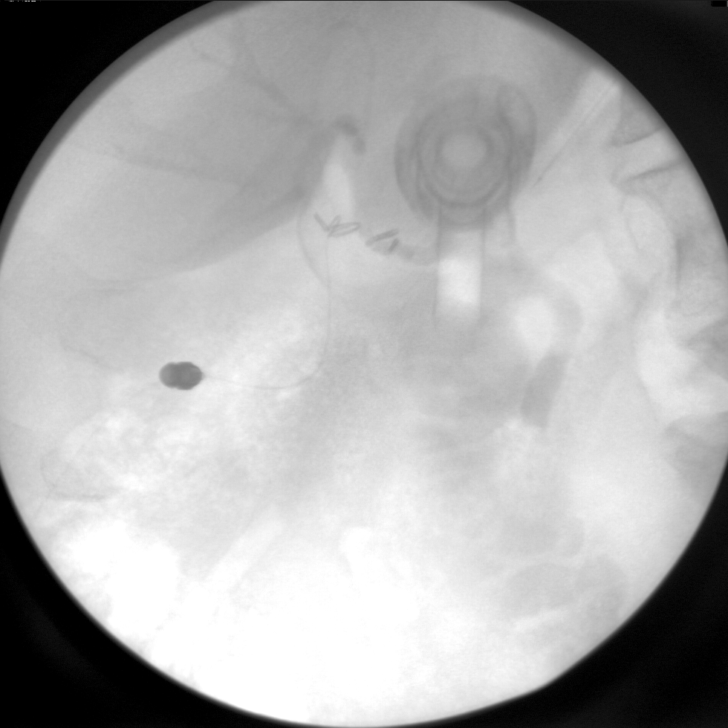

[9 of 9 positions shown; findings below may reference images not displayed]

FINDINGS: No definite well-defined filling defect within the
biliary system.  Contrast is seen passing to the small bowel.
Biliary system is normal caliber.
IMPRESSION: No definite retained stone or obstruction.

These images were submitted for radiologic interpretation only.
Please see the procedural report for the amount of contrast and the
fluoroscopy time utilized.

## 2011-07-19 LAB — COMPREHENSIVE METABOLIC PANEL
ALT: 28
ALT: 32
AST: 22
AST: 27
Albumin: 3.5
Albumin: 3.9
Alkaline Phosphatase: 62
Alkaline Phosphatase: 71
BUN: 18
BUN: 11
CO2: 25
CO2: 26
Calcium: 9.2
Calcium: 9.6
Chloride: 107
Chloride: 102
Creatinine, Ser: 0.76
Creatinine, Ser: 0.71
GFR calc Af Amer: >60
GFR calc Af Amer: >60
GFR calc non Af Amer: >60
GFR calc non Af Amer: >60
Glucose, Bld: 173 — ABNORMAL HIGH
Glucose, Bld: 178 — ABNORMAL HIGH
Potassium: 3.7
Potassium: 3.9
Sodium: 139
Sodium: 135
Total Bilirubin: 1.5 — ABNORMAL HIGH
Total Bilirubin: 1.1
Total Protein: 6.2
Total Protein: 7.1

## 2011-07-19 LAB — CK TOTAL AND CKMB (NOT AT ARMC)
CK, MB: 3.1
CK, MB: 3.7
CK, MB: 3.5
Relative Index: 2.3
Relative Index: 3 — ABNORMAL HIGH
Relative Index: 3.2 — ABNORMAL HIGH
Total CK: 102
Total CK: 160
Total CK: 108

## 2011-07-19 LAB — LIPASE, BLOOD: Lipase: 16

## 2011-07-19 LAB — DIFFERENTIAL
Basophils Absolute: 0
Basophils Absolute: 0
Basophils Relative: 0
Basophils Relative: 0
Eosinophils Absolute: 0
Eosinophils Absolute: 0
Eosinophils Relative: 0
Eosinophils Relative: 0
Lymphocytes Relative: 5 — ABNORMAL LOW
Lymphocytes Relative: 4 — ABNORMAL LOW
Lymphs Abs: 0.5 — ABNORMAL LOW
Lymphs Abs: 0.5 — ABNORMAL LOW
Monocytes Absolute: 0.8
Monocytes Absolute: 0.8
Monocytes Relative: 9
Monocytes Relative: 6
Neutro Abs: 7.9 — ABNORMAL HIGH
Neutro Abs: 11.7 — ABNORMAL HIGH
Neutrophils Relative %: 86 — ABNORMAL HIGH
Neutrophils Relative %: 89 — ABNORMAL HIGH

## 2011-07-19 LAB — URINALYSIS, ROUTINE W REFLEX MICROSCOPIC
Glucose, UA: NEGATIVE
Hgb urine dipstick: NEGATIVE
Ketones, ur: 15 — AB
Leukocytes, UA: NEGATIVE
Nitrite: NEGATIVE
Protein, ur: 30 — AB
Specific Gravity, Urine: 1.028
Urobilinogen, UA: 1
pH: 6

## 2011-07-19 LAB — BASIC METABOLIC PANEL
BUN: 14
BUN: 5 — ABNORMAL LOW
BUN: 9
CO2: 26
CO2: 27
CO2: 27
Calcium: 8.5
Calcium: 8.6
Calcium: 9.1
Chloride: 108
Chloride: 109
Chloride: 103
Creatinine, Ser: 0.64
Creatinine, Ser: 0.87
Creatinine, Ser: 0.62
GFR calc Af Amer: >60
GFR calc Af Amer: >60
GFR calc Af Amer: >60
GFR calc non Af Amer: >60
GFR calc non Af Amer: >60
GFR calc non Af Amer: >60
Glucose, Bld: 140 — ABNORMAL HIGH
Glucose, Bld: 144 — ABNORMAL HIGH
Glucose, Bld: 154 — ABNORMAL HIGH
Potassium: 3 — ABNORMAL LOW
Potassium: 3.5
Potassium: 3.7
Sodium: 138
Sodium: 139
Sodium: 136

## 2011-07-19 LAB — AMYLASE: Amylase: 69

## 2011-07-19 LAB — CBC
HCT: 36.1 — ABNORMAL LOW
HCT: 42.7
HCT: 39.4
HCT: 47.1
Hemoglobin: 12.7 — ABNORMAL LOW
Hemoglobin: 14.9
Hemoglobin: 13.9
Hemoglobin: 16.2
MCHC: 35
MCHC: 35.2
MCHC: 34.3
MCHC: 35.2
MCV: 93.4
MCV: 94.4
MCV: 93.3
MCV: 93.5
Platelets: 134 — ABNORMAL LOW
Platelets: 163
Platelets: 184
Platelets: 194
RBC: 3.82 — ABNORMAL LOW
RBC: 4.57
RBC: 4.22
RBC: 5.04
RDW: 12.6
RDW: 12.6
RDW: 12.5
RDW: 12.6
WBC: 5.6
WBC: 9.1
WBC: 13.1 — ABNORMAL HIGH
WBC: 6

## 2011-07-19 LAB — TROPONIN I
Troponin I: 0.12 — ABNORMAL HIGH
Troponin I: 0.15 — ABNORMAL HIGH
Troponin I: 0.16 — ABNORMAL HIGH

## 2011-07-19 LAB — POCT CARDIAC MARKERS
CKMB, poc: 2
Myoglobin, poc: 68
Operator id: 196461
Troponin i, poc: <0.05

## 2011-07-19 LAB — STOOL CULTURE

## 2011-07-19 LAB — CARDIAC PANEL(CRET KIN+CKTOT+MB+TROPI)
CK, MB: 2.3
CK, MB: 2.9
Relative Index: INVALID
Relative Index: INVALID
Total CK: 67
Total CK: 90
Troponin I: 0.13 — ABNORMAL HIGH
Troponin I: 0.14 — ABNORMAL HIGH

## 2011-07-19 LAB — URINE MICROSCOPIC-ADD ON

## 2011-07-19 LAB — PROTIME-INR
INR: 1.1
INR: 1
Prothrombin Time: 14.5
Prothrombin Time: 13.1

## 2011-07-19 LAB — APTT: aPTT: 27

## 2011-07-19 LAB — T4: T4, Total: 7.4

## 2011-07-19 LAB — TSH: TSH: 0.449

## 2011-07-25 LAB — URINALYSIS, ROUTINE W REFLEX MICROSCOPIC
Bilirubin Urine: NEGATIVE
Glucose, UA: 500 — AB
Glucose, UA: NEGATIVE
Hgb urine dipstick: NEGATIVE
Hgb urine dipstick: NEGATIVE
Ketones, ur: NEGATIVE
Ketones, ur: 15 — AB
Leukocytes, UA: NEGATIVE
Nitrite: NEGATIVE
Nitrite: NEGATIVE
Protein, ur: NEGATIVE
Protein, ur: 100 — AB
Specific Gravity, Urine: 1.011
Specific Gravity, Urine: 1.025
Urobilinogen, UA: 0.2
Urobilinogen, UA: 1
pH: 7
pH: 6

## 2011-07-25 LAB — CARDIAC PANEL(CRET KIN+CKTOT+MB+TROPI)
CK, MB: 2.9
CK, MB: 3.2
CK, MB: 3.3
CK, MB: 1.4
Relative Index: 1.3
Relative Index: 1.6
Relative Index: 2.4
Relative Index: INVALID
Total CK: 137
Total CK: 194
Total CK: 219
Total CK: 23
Troponin I: 0.11 — ABNORMAL HIGH
Troponin I: 0.11 — ABNORMAL HIGH
Troponin I: 0.12 — ABNORMAL HIGH
Troponin I: 0.09 — ABNORMAL HIGH

## 2011-07-25 LAB — COMPREHENSIVE METABOLIC PANEL
ALT: 25
ALT: 42
ALT: 24
ALT: 24
ALT: 27
ALT: 31
ALT: 32
ALT: 40
AST: 30
AST: 32
AST: 13
AST: 18
AST: 18
AST: 23
AST: 26
AST: 34
Albumin: 2.6 — ABNORMAL LOW
Albumin: 3.9
Albumin: 2.4 — ABNORMAL LOW
Albumin: 2.5 — ABNORMAL LOW
Albumin: 2.5 — ABNORMAL LOW
Albumin: 2.6 — ABNORMAL LOW
Albumin: 2.8 — ABNORMAL LOW
Albumin: 3.2 — ABNORMAL LOW
Alkaline Phosphatase: 47
Alkaline Phosphatase: 78
Alkaline Phosphatase: 43
Alkaline Phosphatase: 43
Alkaline Phosphatase: 46
Alkaline Phosphatase: 47
Alkaline Phosphatase: 62
Alkaline Phosphatase: 71
BUN: 11
BUN: 11
BUN: 11
BUN: 13
BUN: 3 — ABNORMAL LOW
BUN: 3 — ABNORMAL LOW
BUN: 5 — ABNORMAL LOW
BUN: 6
CO2: 24
CO2: 27
CO2: 22
CO2: 25
CO2: 26
CO2: 27
CO2: 27
CO2: 28
Calcium: 8 — ABNORMAL LOW
Calcium: 9.6
Calcium: 8.2 — ABNORMAL LOW
Calcium: 8.3 — ABNORMAL LOW
Calcium: 8.5
Calcium: 8.6
Calcium: 8.9
Calcium: 9.2
Chloride: 102
Chloride: 105
Chloride: 101
Chloride: 101
Chloride: 102
Chloride: 103
Chloride: 103
Chloride: 104
Creatinine, Ser: 0.6
Creatinine, Ser: 0.8
Creatinine, Ser: 0.58
Creatinine, Ser: 0.66
Creatinine, Ser: 0.78
Creatinine, Ser: 0.78
Creatinine, Ser: 0.85
Creatinine, Ser: 0.97
GFR calc Af Amer: >60
GFR calc Af Amer: >60
GFR calc Af Amer: >60
GFR calc Af Amer: >60
GFR calc Af Amer: >60
GFR calc Af Amer: >60
GFR calc Af Amer: >60
GFR calc Af Amer: >60
GFR calc non Af Amer: >60
GFR calc non Af Amer: >60
GFR calc non Af Amer: >60
GFR calc non Af Amer: >60
GFR calc non Af Amer: >60
GFR calc non Af Amer: >60
GFR calc non Af Amer: >60
GFR calc non Af Amer: >60
Glucose, Bld: 169 — ABNORMAL HIGH
Glucose, Bld: 265 — ABNORMAL HIGH
Glucose, Bld: 108 — ABNORMAL HIGH
Glucose, Bld: 113 — ABNORMAL HIGH
Glucose, Bld: 152 — ABNORMAL HIGH
Glucose, Bld: 180 — ABNORMAL HIGH
Glucose, Bld: 184 — ABNORMAL HIGH
Glucose, Bld: 214 — ABNORMAL HIGH
Potassium: 3.6
Potassium: 4.3
Potassium: 3 — ABNORMAL LOW
Potassium: 3.2 — ABNORMAL LOW
Potassium: 3.4 — ABNORMAL LOW
Potassium: 4.2
Potassium: 4.3
Potassium: 4.3
Sodium: 135
Sodium: 135
Sodium: 135
Sodium: 135
Sodium: 135
Sodium: 137
Sodium: 137
Sodium: 137
Total Bilirubin: 0.7
Total Bilirubin: 1.3 — ABNORMAL HIGH
Total Bilirubin: 0.7
Total Bilirubin: 0.7
Total Bilirubin: 0.8
Total Bilirubin: 1
Total Bilirubin: 1.3 — ABNORMAL HIGH
Total Bilirubin: 1.7 — ABNORMAL HIGH
Total Protein: 4.7 — ABNORMAL LOW
Total Protein: 6.8
Total Protein: 5.2 — ABNORMAL LOW
Total Protein: 5.6 — ABNORMAL LOW
Total Protein: 5.6 — ABNORMAL LOW
Total Protein: 5.7 — ABNORMAL LOW
Total Protein: 6.2
Total Protein: 7

## 2011-07-25 LAB — CBC
HCT: 30.8 — ABNORMAL LOW
HCT: 32 — ABNORMAL LOW
HCT: 32.7 — ABNORMAL LOW
HCT: 33.5 — ABNORMAL LOW
HCT: 33.8 — ABNORMAL LOW
HCT: 34.4 — ABNORMAL LOW
HCT: 39.8
HCT: 31.3 — ABNORMAL LOW
HCT: 31.5 — ABNORMAL LOW
HCT: 32.1 — ABNORMAL LOW
HCT: 32.6 — ABNORMAL LOW
HCT: 37.4 — ABNORMAL LOW
Hemoglobin: 11.1 — ABNORMAL LOW
Hemoglobin: 11.5 — ABNORMAL LOW
Hemoglobin: 11.6 — ABNORMAL LOW
Hemoglobin: 11.9 — ABNORMAL LOW
Hemoglobin: 11.9 — ABNORMAL LOW
Hemoglobin: 12 — ABNORMAL LOW
Hemoglobin: 13.9
Hemoglobin: 11 — ABNORMAL LOW
Hemoglobin: 11 — ABNORMAL LOW
Hemoglobin: 11.3 — ABNORMAL LOW
Hemoglobin: 11.4 — ABNORMAL LOW
Hemoglobin: 13.2
MCHC: 34.9
MCHC: 35.1
MCHC: 35.3
MCHC: 35.4
MCHC: 35.5
MCHC: 35.9
MCHC: 35.9
MCHC: 34.8
MCHC: 35
MCHC: 35.1
MCHC: 35.2
MCHC: 35.3
MCV: 93.3
MCV: 93.3
MCV: 93.4
MCV: 93.8
MCV: 93.9
MCV: 94
MCV: 94.4
MCV: 92.6
MCV: 92.8
MCV: 93.3
MCV: 93.6
MCV: 93.7
Platelets: 104 — ABNORMAL LOW
Platelets: 112 — ABNORMAL LOW
Platelets: 151
Platelets: 163
Platelets: 234
Platelets: 87 — ABNORMAL LOW
Platelets: 94 — ABNORMAL LOW
Platelets: 214
Platelets: 265
Platelets: 276
Platelets: 282
Platelets: 302
RBC: 3.28 — ABNORMAL LOW
RBC: 3.4 — ABNORMAL LOW
RBC: 3.5 — ABNORMAL LOW
RBC: 3.58 — ABNORMAL LOW
RBC: 3.62 — ABNORMAL LOW
RBC: 3.65 — ABNORMAL LOW
RBC: 4.26
RBC: 3.37 — ABNORMAL LOW
RBC: 3.38 — ABNORMAL LOW
RBC: 3.47 — ABNORMAL LOW
RBC: 3.49 — ABNORMAL LOW
RBC: 4 — ABNORMAL LOW
RDW: 12.7
RDW: 12.8
RDW: 12.8
RDW: 12.8
RDW: 12.9
RDW: 13
RDW: 13.1
RDW: 12.9
RDW: 13
RDW: 13.4
RDW: 13.6
RDW: 14.2
WBC: 11.7 — ABNORMAL HIGH
WBC: 12.3 — ABNORMAL HIGH
WBC: 12.4 — ABNORMAL HIGH
WBC: 4.6
WBC: 4.6
WBC: 5.8
WBC: 8.7
WBC: 11.1 — ABNORMAL HIGH
WBC: 16.9 — ABNORMAL HIGH
WBC: 8.6
WBC: 9.2
WBC: 9.8

## 2011-07-25 LAB — BASIC METABOLIC PANEL
BUN: 11
BUN: 12
BUN: 13
BUN: 14
BUN: 10
BUN: 8
CO2: 24
CO2: 26
CO2: 26
CO2: 31
CO2: 26
CO2: 27
Calcium: 7.9 — ABNORMAL LOW
Calcium: 8.3 — ABNORMAL LOW
Calcium: 8.4
Calcium: 8.6
Calcium: 8.8
Calcium: 8.9
Chloride: 104
Chloride: 98
Chloride: 98
Chloride: 98
Chloride: 100
Chloride: 103
Creatinine, Ser: 0.64
Creatinine, Ser: 0.7
Creatinine, Ser: 0.74
Creatinine, Ser: 0.8
Creatinine, Ser: 0.58
Creatinine, Ser: 0.65
GFR calc Af Amer: >60
GFR calc Af Amer: >60
GFR calc Af Amer: >60
GFR calc Af Amer: >60
GFR calc Af Amer: >60
GFR calc Af Amer: >60
GFR calc non Af Amer: >60
GFR calc non Af Amer: >60
GFR calc non Af Amer: >60
GFR calc non Af Amer: >60
GFR calc non Af Amer: >60
GFR calc non Af Amer: >60
Glucose, Bld: 122 — ABNORMAL HIGH
Glucose, Bld: 163 — ABNORMAL HIGH
Glucose, Bld: 180 — ABNORMAL HIGH
Glucose, Bld: 204 — ABNORMAL HIGH
Glucose, Bld: 160 — ABNORMAL HIGH
Glucose, Bld: 197 — ABNORMAL HIGH
Potassium: 3.3 — ABNORMAL LOW
Potassium: 3.9
Potassium: 4.1
Potassium: 4.3
Potassium: 4.2
Potassium: 4.4
Sodium: 132 — ABNORMAL LOW
Sodium: 133 — ABNORMAL LOW
Sodium: 134 — ABNORMAL LOW
Sodium: 136
Sodium: 134 — ABNORMAL LOW
Sodium: 136

## 2011-07-25 LAB — DIFFERENTIAL
Basophils Absolute: 0
Basophils Absolute: 0
Basophils Absolute: 0
Basophils Relative: 0
Basophils Relative: 0
Basophils Relative: 0
Eosinophils Absolute: 0
Eosinophils Absolute: 0.1
Eosinophils Absolute: 0.1
Eosinophils Relative: 0
Eosinophils Relative: 1
Eosinophils Relative: 1
Lymphocytes Relative: 10 — ABNORMAL LOW
Lymphocytes Relative: 5 — ABNORMAL LOW
Lymphocytes Relative: 7 — ABNORMAL LOW
Lymphs Abs: 0.6 — ABNORMAL LOW
Lymphs Abs: 0.9
Lymphs Abs: 0.9
Monocytes Absolute: 0.4
Monocytes Absolute: 0.9
Monocytes Absolute: 0.9
Monocytes Relative: 10
Monocytes Relative: 5
Monocytes Relative: 6
Neutro Abs: 15 — ABNORMAL HIGH
Neutro Abs: 7.3
Neutro Abs: 7.6
Neutrophils Relative %: 79 — ABNORMAL HIGH
Neutrophils Relative %: 88 — ABNORMAL HIGH
Neutrophils Relative %: 88 — ABNORMAL HIGH

## 2011-07-25 LAB — PROTIME-INR
INR: 1
INR: 1.4
Prothrombin Time: 13.6
Prothrombin Time: 17 — ABNORMAL HIGH

## 2011-07-25 LAB — POCT CARDIAC MARKERS
CKMB, poc: 1.3
CKMB, poc: 1.7
Myoglobin, poc: 109
Myoglobin, poc: 92.7
Operator id: 294511
Operator id: 294511
Troponin i, poc: 0.05
Troponin i, poc: <0.05

## 2011-07-25 LAB — BLOOD GAS, ARTERIAL
Acid-Base Excess: 1.5
Acid-base deficit: 1.5
Bicarbonate: 23.8
Bicarbonate: 25.7 — ABNORMAL HIGH
Drawn by: 181601
Drawn by: 279341
FIO2: 0.21
O2 Content: 10
O2 Saturation: 94.5
O2 Saturation: 98.7
Patient temperature: 98.6
Patient temperature: 98.6
TCO2: 25.3
TCO2: 27
pCO2 arterial: 41.4
pCO2 arterial: 48.2 — ABNORMAL HIGH
pH, Arterial: 7.314 — ABNORMAL LOW
pH, Arterial: 7.41
pO2, Arterial: 145 — ABNORMAL HIGH
pO2, Arterial: 70.7 — ABNORMAL LOW

## 2011-07-25 LAB — MAGNESIUM
Magnesium: 1.5
Magnesium: 1.6
Magnesium: 1.7
Magnesium: 1.7

## 2011-07-25 LAB — LIPASE, BLOOD: Lipase: 35

## 2011-07-25 LAB — POCT I-STAT, CHEM 8
BUN: 12
Calcium, Ion: 1.19
Chloride: 102
Creatinine, Ser: 1
Glucose, Bld: 177 — ABNORMAL HIGH
HCT: 38 — ABNORMAL LOW
Hemoglobin: 12.9 — ABNORMAL LOW
Potassium: 4.3
Sodium: 137
TCO2: 26

## 2011-07-25 LAB — RENAL FUNCTION PANEL
Albumin: 2.4 — ABNORMAL LOW
Albumin: 2.6 — ABNORMAL LOW
BUN: 11
BUN: 6
CO2: 26
CO2: 27
Calcium: 8.2 — ABNORMAL LOW
Calcium: 8.3 — ABNORMAL LOW
Chloride: 98
Chloride: 98
Creatinine, Ser: 0.63
Creatinine, Ser: 0.86
GFR calc Af Amer: >60
GFR calc Af Amer: >60
GFR calc non Af Amer: >60
GFR calc non Af Amer: >60
Glucose, Bld: 143 — ABNORMAL HIGH
Glucose, Bld: 176 — ABNORMAL HIGH
Phosphorus: 2 — ABNORMAL LOW
Phosphorus: 2.5
Potassium: 3 — ABNORMAL LOW
Potassium: 3.8
Sodium: 131 — ABNORMAL LOW
Sodium: 133 — ABNORMAL LOW

## 2011-07-25 LAB — CK TOTAL AND CKMB (NOT AT ARMC)
CK, MB: 1.7
CK, MB: 2.1
Relative Index: INVALID
Relative Index: INVALID
Total CK: 19
Total CK: 31

## 2011-07-25 LAB — TYPE AND SCREEN
ABO/RH(D): AB POS
Antibody Screen: NEGATIVE

## 2011-07-25 LAB — PHOSPHORUS
Phosphorus: 2 — ABNORMAL LOW
Phosphorus: 2.6
Phosphorus: 3

## 2011-07-25 LAB — POCT I-STAT 4, (NA,K, GLUC, HGB,HCT)
Glucose, Bld: 195 — ABNORMAL HIGH
Glucose, Bld: 227 — ABNORMAL HIGH
HCT: 32 — ABNORMAL LOW
HCT: 33 — ABNORMAL LOW
Hemoglobin: 10.9 — ABNORMAL LOW
Hemoglobin: 11.2 — ABNORMAL LOW
Operator id: 140821
Operator id: 140821
Potassium: 4.2
Potassium: 4.3
Sodium: 135
Sodium: 137

## 2011-07-25 LAB — POCT I-STAT 3, ART BLOOD GAS (G3+)
Bicarbonate: 25.9 — ABNORMAL HIGH
O2 Saturation: 91
Operator id: 297641
Patient temperature: 37.8
TCO2: 27
pCO2 arterial: 46.2 — ABNORMAL HIGH
pH, Arterial: 7.359
pO2, Arterial: 67 — ABNORMAL LOW

## 2011-07-25 LAB — URINE MICROSCOPIC-ADD ON

## 2011-07-25 LAB — PREALBUMIN: Prealbumin: 8.4 — ABNORMAL LOW

## 2011-07-25 LAB — AMYLASE
Amylase: 129
Amylase: 56
Amylase: 61

## 2011-07-25 LAB — POTASSIUM: Potassium: 3.6

## 2011-07-25 LAB — APTT
aPTT: 27
aPTT: 32

## 2011-07-25 LAB — CHOLESTEROL, TOTAL: Cholesterol: 53

## 2011-07-25 LAB — ABO/RH: ABO/RH(D): AB POS

## 2011-07-25 LAB — TROPONIN I
Troponin I: 0.12 — ABNORMAL HIGH
Troponin I: 0.11 — ABNORMAL HIGH

## 2011-07-25 LAB — TRIGLYCERIDES: Triglycerides: 81

## 2011-08-08 ENCOUNTER — Emergency Department (HOSPITAL_COMMUNITY): Payer: Medicare Other

## 2011-08-08 ENCOUNTER — Inpatient Hospital Stay (INDEPENDENT_AMBULATORY_CARE_PROVIDER_SITE_OTHER)
Admission: RE | Admit: 2011-08-08 | Discharge: 2011-08-08 | Disposition: A | Discharge status: Home / Self Care | Payer: Medicare Other | Source: Ambulatory Visit | Attending: Emergency Medicine | Admitting: Emergency Medicine

## 2011-08-08 ENCOUNTER — Emergency Department (HOSPITAL_COMMUNITY)
Admission: EM | Admit: 2011-08-08 | Discharge: 2011-08-09 | Disposition: A | Discharge status: Home / Self Care | Payer: Medicare Other | Attending: Emergency Medicine | Admitting: Emergency Medicine

## 2011-08-08 DIAGNOSIS — I252 Old myocardial infarction: Secondary | ICD-10-CM | POA: Insufficient documentation

## 2011-08-08 DIAGNOSIS — J3489 Other specified disorders of nose and nasal sinuses: Secondary | ICD-10-CM | POA: Insufficient documentation

## 2011-08-08 DIAGNOSIS — I498 Other specified cardiac arrhythmias: Secondary | ICD-10-CM

## 2011-08-08 DIAGNOSIS — R059 Cough, unspecified: Secondary | ICD-10-CM | POA: Insufficient documentation

## 2011-08-08 DIAGNOSIS — R05 Cough: Secondary | ICD-10-CM | POA: Insufficient documentation

## 2011-08-08 DIAGNOSIS — R0989 Other specified symptoms and signs involving the circulatory and respiratory systems: Secondary | ICD-10-CM | POA: Insufficient documentation

## 2011-08-08 DIAGNOSIS — I4949 Other premature depolarization: Secondary | ICD-10-CM | POA: Insufficient documentation

## 2011-08-08 DIAGNOSIS — I10 Essential (primary) hypertension: Secondary | ICD-10-CM | POA: Insufficient documentation

## 2011-08-08 DIAGNOSIS — E119 Type 2 diabetes mellitus without complications: Secondary | ICD-10-CM | POA: Insufficient documentation

## 2011-08-08 DIAGNOSIS — I459 Conduction disorder, unspecified: Secondary | ICD-10-CM | POA: Insufficient documentation

## 2011-08-08 DIAGNOSIS — J069 Acute upper respiratory infection, unspecified: Secondary | ICD-10-CM | POA: Insufficient documentation

## 2011-08-08 DIAGNOSIS — J984 Other disorders of lung: Secondary | ICD-10-CM | POA: Insufficient documentation

## 2011-08-08 DIAGNOSIS — I2510 Atherosclerotic heart disease of native coronary artery without angina pectoris: Secondary | ICD-10-CM

## 2011-08-08 DIAGNOSIS — K219 Gastro-esophageal reflux disease without esophagitis: Secondary | ICD-10-CM | POA: Insufficient documentation

## 2011-08-08 DIAGNOSIS — E7800 Pure hypercholesterolemia, unspecified: Secondary | ICD-10-CM | POA: Insufficient documentation

## 2011-08-08 LAB — CBC
HCT: 41.2 % (ref 39.0–52.0)
Hemoglobin: 14.8 g/dL (ref 13.0–17.0)
MCH: 32.2 pg (ref 26.0–34.0)
MCHC: 35.9 g/dL (ref 30.0–36.0)
MCV: 89.6 fL (ref 78.0–100.0)
Platelets: 197 10*3/uL (ref 150–400)
RBC: 4.6 MIL/uL (ref 4.22–5.81)
RDW: 13.1 % (ref 11.5–15.5)
WBC: 8.1 10*3/uL (ref 4.0–10.5)

## 2011-08-08 LAB — COMPREHENSIVE METABOLIC PANEL
ALT: 27 U/L (ref 0–53)
AST: 20 U/L (ref 0–37)
Albumin: 4.1 g/dL (ref 3.5–5.2)
Alkaline Phosphatase: 76 U/L (ref 39–117)
BUN: 13 mg/dL (ref 6–23)
CO2: 24 mEq/L (ref 19–32)
Calcium: 10.4 mg/dL (ref 8.4–10.5)
Chloride: 102 mEq/L (ref 96–112)
Creatinine, Ser: 0.6 mg/dL (ref 0.50–1.35)
GFR calc Af Amer: >90 mL/min (ref >90)
GFR calc non Af Amer: >90 mL/min (ref >90)
Glucose, Bld: 168 mg/dL — ABNORMAL HIGH (ref 70–99)
Potassium: 4 mEq/L (ref 3.5–5.1)
Sodium: 138 mEq/L (ref 135–145)
Total Bilirubin: 0.6 mg/dL (ref 0.3–1.2)
Total Protein: 7.4 g/dL (ref 6.0–8.3)

## 2011-08-08 LAB — DIFFERENTIAL
Basophils Absolute: 0 10*3/uL (ref 0.0–0.1)
Basophils Relative: 0 % (ref 0–1)
Eosinophils Absolute: 0 10*3/uL (ref 0.0–0.7)
Eosinophils Relative: 0 % (ref 0–5)
Lymphocytes Relative: 17 % (ref 12–46)
Lymphs Abs: 1.4 10*3/uL (ref 0.7–4.0)
Monocytes Absolute: 0.6 10*3/uL (ref 0.1–1.0)
Monocytes Relative: 7 % (ref 3–12)
Neutro Abs: 6.1 10*3/uL (ref 1.7–7.7)
Neutrophils Relative %: 75 % (ref 43–77)

## 2011-08-08 LAB — URINE MICROSCOPIC-ADD ON

## 2011-08-08 LAB — URINALYSIS, ROUTINE W REFLEX MICROSCOPIC
Bilirubin Urine: NEGATIVE
Glucose, UA: NEGATIVE mg/dL
Ketones, ur: NEGATIVE mg/dL
Leukocytes, UA: NEGATIVE
Nitrite: NEGATIVE
Protein, ur: NEGATIVE mg/dL
Specific Gravity, Urine: 1.012 (ref 1.005–1.030)
Urobilinogen, UA: 0.2 mg/dL (ref 0.0–1.0)
pH: 7 (ref 5.0–8.0)

## 2011-08-08 LAB — TROPONIN I: Troponin I: <0.3 ng/mL

## 2011-08-08 LAB — GLUCOSE, CAPILLARY: Glucose-Capillary: 162 mg/dL — ABNORMAL HIGH (ref 70–99)

## 2011-08-08 MED ORDER — IOHEXOL 300 MG/ML  SOLN
100.0000 mL | Freq: Once | INTRAMUSCULAR | Status: AC | PRN
  Administered 2011-08-08: 100 mL via INTRAVENOUS

## 2012-01-01 ENCOUNTER — Other Ambulatory Visit: Payer: Self-pay

## 2012-01-01 ENCOUNTER — Encounter (HOSPITAL_COMMUNITY): Payer: Self-pay

## 2012-01-01 ENCOUNTER — Observation Stay (HOSPITAL_COMMUNITY)
Admission: EM | Admit: 2012-01-01 | Discharge: 2012-01-02 | Disposition: A | Discharge status: Home / Self Care | Payer: Medicare Other | Attending: Internal Medicine | Admitting: Internal Medicine

## 2012-01-01 ENCOUNTER — Emergency Department (HOSPITAL_COMMUNITY): Payer: Medicare Other

## 2012-01-01 DIAGNOSIS — R109 Unspecified abdominal pain: Secondary | ICD-10-CM | POA: Insufficient documentation

## 2012-01-01 DIAGNOSIS — K219 Gastro-esophageal reflux disease without esophagitis: Secondary | ICD-10-CM | POA: Diagnosis present

## 2012-01-01 DIAGNOSIS — I10 Essential (primary) hypertension: Secondary | ICD-10-CM | POA: Diagnosis present

## 2012-01-01 DIAGNOSIS — R61 Generalized hyperhidrosis: Secondary | ICD-10-CM | POA: Diagnosis present

## 2012-01-01 DIAGNOSIS — E1149 Type 2 diabetes mellitus with other diabetic neurological complication: Secondary | ICD-10-CM | POA: Diagnosis present

## 2012-01-01 DIAGNOSIS — E1165 Type 2 diabetes mellitus with hyperglycemia: Secondary | ICD-10-CM | POA: Diagnosis present

## 2012-01-01 DIAGNOSIS — Z79899 Other long term (current) drug therapy: Secondary | ICD-10-CM | POA: Insufficient documentation

## 2012-01-01 DIAGNOSIS — R5383 Other fatigue: Secondary | ICD-10-CM | POA: Insufficient documentation

## 2012-01-01 DIAGNOSIS — M48061 Spinal stenosis, lumbar region without neurogenic claudication: Secondary | ICD-10-CM | POA: Insufficient documentation

## 2012-01-01 DIAGNOSIS — G4733 Obstructive sleep apnea (adult) (pediatric): Secondary | ICD-10-CM | POA: Insufficient documentation

## 2012-01-01 DIAGNOSIS — G629 Polyneuropathy, unspecified: Secondary | ICD-10-CM | POA: Diagnosis present

## 2012-01-01 DIAGNOSIS — G609 Hereditary and idiopathic neuropathy, unspecified: Secondary | ICD-10-CM | POA: Insufficient documentation

## 2012-01-01 DIAGNOSIS — R5381 Other malaise: Secondary | ICD-10-CM | POA: Insufficient documentation

## 2012-01-01 DIAGNOSIS — IMO0001 Reserved for inherently not codable concepts without codable children: Secondary | ICD-10-CM | POA: Insufficient documentation

## 2012-01-01 DIAGNOSIS — I714 Abdominal aortic aneurysm, without rupture: Secondary | ICD-10-CM

## 2012-01-01 DIAGNOSIS — R112 Nausea with vomiting, unspecified: Principal | ICD-10-CM | POA: Diagnosis present

## 2012-01-01 DIAGNOSIS — E7800 Pure hypercholesterolemia, unspecified: Secondary | ICD-10-CM | POA: Diagnosis present

## 2012-01-01 DIAGNOSIS — I2510 Atherosclerotic heart disease of native coronary artery without angina pectoris: Secondary | ICD-10-CM | POA: Insufficient documentation

## 2012-01-01 DIAGNOSIS — R197 Diarrhea, unspecified: Secondary | ICD-10-CM | POA: Insufficient documentation

## 2012-01-01 DIAGNOSIS — E1140 Type 2 diabetes mellitus with diabetic neuropathy, unspecified: Secondary | ICD-10-CM | POA: Diagnosis present

## 2012-01-01 DIAGNOSIS — G4730 Sleep apnea, unspecified: Secondary | ICD-10-CM | POA: Diagnosis present

## 2012-01-01 DIAGNOSIS — D72829 Elevated white blood cell count, unspecified: Secondary | ICD-10-CM | POA: Diagnosis present

## 2012-01-01 HISTORY — DX: Abdominal aortic aneurysm, without rupture, unspecified: I71.40

## 2012-01-01 HISTORY — DX: Abdominal aortic aneurysm, without rupture: I71.4

## 2012-01-01 HISTORY — DX: Acute myocardial infarction, unspecified: I21.9

## 2012-01-01 HISTORY — DX: Gastro-esophageal reflux disease without esophagitis: K21.9

## 2012-01-01 HISTORY — DX: Polyneuropathy, unspecified: G62.9

## 2012-01-01 HISTORY — DX: Essential (primary) hypertension: I10

## 2012-01-01 HISTORY — DX: Pure hypercholesterolemia, unspecified: E78.00

## 2012-01-01 HISTORY — DX: Other complications of anesthesia, initial encounter: T88.59XA

## 2012-01-01 HISTORY — DX: Adverse effect of unspecified anesthetic, initial encounter: T41.45XA

## 2012-01-01 HISTORY — DX: Pain, unspecified: R52

## 2012-01-01 LAB — COMPREHENSIVE METABOLIC PANEL
ALT: 28 U/L (ref 0–53)
AST: 26 U/L (ref 0–37)
Albumin: 4 g/dL (ref 3.5–5.2)
Alkaline Phosphatase: 78 U/L (ref 39–117)
BUN: 15 mg/dL (ref 6–23)
CO2: 28 mEq/L (ref 19–32)
Calcium: 10.2 mg/dL (ref 8.4–10.5)
Chloride: 99 mEq/L (ref 96–112)
Creatinine, Ser: 0.84 mg/dL (ref 0.50–1.35)
GFR calc Af Amer: >90 mL/min (ref >90)
GFR calc non Af Amer: 85 mL/min — ABNORMAL LOW (ref >90)
Glucose, Bld: 202 mg/dL — ABNORMAL HIGH (ref 70–99)
Potassium: 4.5 mEq/L (ref 3.5–5.1)
Sodium: 136 mEq/L (ref 135–145)
Total Bilirubin: 0.6 mg/dL (ref 0.3–1.2)
Total Protein: 7.4 g/dL (ref 6.0–8.3)

## 2012-01-01 LAB — URINALYSIS, ROUTINE W REFLEX MICROSCOPIC
Bilirubin Urine: NEGATIVE
Glucose, UA: NEGATIVE mg/dL
Hgb urine dipstick: NEGATIVE
Ketones, ur: NEGATIVE mg/dL
Leukocytes, UA: NEGATIVE
Nitrite: NEGATIVE
Protein, ur: NEGATIVE mg/dL
Specific Gravity, Urine: 1.02 (ref 1.005–1.030)
Urobilinogen, UA: 0.2 mg/dL (ref 0.0–1.0)
pH: 7 (ref 5.0–8.0)

## 2012-01-01 LAB — DIFFERENTIAL
Basophils Absolute: 0 10*3/uL (ref 0.0–0.1)
Basophils Relative: 0 % (ref 0–1)
Eosinophils Absolute: 0 10*3/uL (ref 0.0–0.7)
Eosinophils Relative: 0 % (ref 0–5)
Lymphocytes Relative: 7 % — ABNORMAL LOW (ref 12–46)
Lymphs Abs: 1.2 10*3/uL (ref 0.7–4.0)
Monocytes Absolute: 1.7 10*3/uL — ABNORMAL HIGH (ref 0.1–1.0)
Monocytes Relative: 10 % (ref 3–12)
Neutro Abs: 14.4 10*3/uL — ABNORMAL HIGH (ref 1.7–7.7)
Neutrophils Relative %: 83 % — ABNORMAL HIGH (ref 43–77)
WBC Morphology: INCREASED

## 2012-01-01 LAB — CBC
HCT: 45.5 % (ref 39.0–52.0)
Hemoglobin: 15.8 g/dL (ref 13.0–17.0)
MCH: 31.3 pg (ref 26.0–34.0)
MCHC: 34.7 g/dL (ref 30.0–36.0)
MCV: 90.3 fL (ref 78.0–100.0)
Platelets: 165 10*3/uL (ref 150–400)
RBC: 5.04 MIL/uL (ref 4.22–5.81)
RDW: 13 % (ref 11.5–15.5)
WBC: 17.3 10*3/uL — ABNORMAL HIGH (ref 4.0–10.5)

## 2012-01-01 LAB — CARDIAC PANEL(CRET KIN+CKTOT+MB+TROPI)
CK, MB: 4.6 ng/mL — ABNORMAL HIGH (ref 0.3–4.0)
Relative Index: 3.8 — ABNORMAL HIGH (ref 0.0–2.5)
Total CK: 121 U/L (ref 7–232)
Troponin I: <0.3 ng/mL (ref <0.30)

## 2012-01-01 LAB — LIPASE, BLOOD: Lipase: 26 U/L (ref 11–59)

## 2012-01-01 LAB — CLOSTRIDIUM DIFFICILE BY PCR: Toxigenic C. Difficile by PCR: NEGATIVE

## 2012-01-01 LAB — GLUCOSE, CAPILLARY: Glucose-Capillary: 178 mg/dL — ABNORMAL HIGH (ref 70–99)

## 2012-01-01 MED ORDER — ACETAMINOPHEN 325 MG PO TABS: 650.0000 mg | ORAL_TABLET | Freq: Four times a day (QID) | ORAL | Status: DC | PRN

## 2012-01-01 MED ORDER — BUPRENORPHINE 10 MCG/HR TD PTWK: 1.0000 | MEDICATED_PATCH | TRANSDERMAL | Status: DC

## 2012-01-01 MED ORDER — ATORVASTATIN CALCIUM 10 MG PO TABS
10.0000 mg | ORAL_TABLET | Freq: Every day | ORAL | Status: DC
  Filled 2012-01-01: qty 1

## 2012-01-01 MED ORDER — PANTOPRAZOLE SODIUM 40 MG PO TBEC
40.0000 mg | DELAYED_RELEASE_TABLET | Freq: Two times a day (BID) | ORAL | Status: DC
  Administered 2012-01-02: 40 mg via ORAL
  Filled 2012-01-01 (×2): qty 1

## 2012-01-01 MED ORDER — TRAMADOL HCL 50 MG PO TABS: 50.0000 mg | ORAL_TABLET | Freq: Four times a day (QID) | ORAL | Status: DC | PRN

## 2012-01-01 MED ORDER — METOPROLOL TARTRATE 25 MG PO TABS
25.0000 mg | ORAL_TABLET | Freq: Two times a day (BID) | ORAL | Status: DC
  Administered 2012-01-01: 25 mg via ORAL
  Filled 2012-01-01 (×3): qty 1

## 2012-01-01 MED ORDER — PROMETHAZINE HCL 25 MG PO TABS: 25.0000 mg | ORAL_TABLET | Freq: Four times a day (QID) | ORAL | Status: DC | PRN

## 2012-01-01 MED ORDER — FINASTERIDE 1 MG PO TABS
1.0000 mg | ORAL_TABLET | Freq: Every day | ORAL | Status: DC
  Filled 2012-01-01 (×2): qty 1

## 2012-01-01 MED ORDER — ASPIRIN EC 81 MG PO TBEC
81.0000 mg | DELAYED_RELEASE_TABLET | Freq: Every day | ORAL | Status: DC
  Administered 2012-01-02: 81 mg via ORAL
  Filled 2012-01-01: qty 1

## 2012-01-01 MED ORDER — RAMIPRIL 5 MG PO CAPS
5.0000 mg | ORAL_CAPSULE | Freq: Every day | ORAL | Status: DC
  Filled 2012-01-01: qty 1

## 2012-01-01 MED ORDER — INSULIN ASPART 100 UNIT/ML ~~LOC~~ SOLN
0.0000 [IU] | Freq: Three times a day (TID) | SUBCUTANEOUS | Status: DC
  Administered 2012-01-02: 2 [IU] via SUBCUTANEOUS
  Filled 2012-01-01: qty 3

## 2012-01-01 MED ORDER — ENOXAPARIN SODIUM 40 MG/0.4ML ~~LOC~~ SOLN
40.0000 mg | SUBCUTANEOUS | Status: DC
  Administered 2012-01-01: 40 mg via SUBCUTANEOUS
  Filled 2012-01-01 (×2): qty 0.4

## 2012-01-01 MED ORDER — SODIUM CHLORIDE 0.9 % IV BOLUS (SEPSIS)
1000.0000 mL | Freq: Once | INTRAVENOUS | Status: AC
  Administered 2012-01-01: 1000 mL via INTRAVENOUS

## 2012-01-01 MED ORDER — VITAMIN B-12 100 MCG PO TABS
50.0000 ug | ORAL_TABLET | Freq: Every day | ORAL | Status: DC
  Administered 2012-01-02: 50 ug via ORAL
  Filled 2012-01-01: qty 1

## 2012-01-01 MED ORDER — SODIUM CHLORIDE 0.9 % IV SOLN: INTRAVENOUS | Status: AC

## 2012-01-01 MED ORDER — ONDANSETRON HCL 4 MG/2ML IJ SOLN
INTRAMUSCULAR | Status: AC
  Filled 2012-01-01: qty 2

## 2012-01-01 MED ORDER — ONDANSETRON HCL 4 MG PO TABS: 4.0000 mg | ORAL_TABLET | Freq: Four times a day (QID) | ORAL | Status: DC | PRN

## 2012-01-01 MED ORDER — ONDANSETRON HCL 4 MG/2ML IJ SOLN
4.0000 mg | Freq: Four times a day (QID) | INTRAMUSCULAR | Status: DC | PRN
  Administered 2012-01-01 – 2012-01-02 (×2): 4 mg via INTRAVENOUS
  Filled 2012-01-01 (×2): qty 2

## 2012-01-01 MED ORDER — ACETAMINOPHEN 650 MG RE SUPP: 650.0000 mg | Freq: Four times a day (QID) | RECTAL | Status: DC | PRN

## 2012-01-01 MED ORDER — DULOXETINE HCL 20 MG PO CPEP
20.0000 mg | ORAL_CAPSULE | Freq: Every day | ORAL | Status: DC
  Filled 2012-01-01: qty 1

## 2012-01-01 MED ORDER — HYDROCODONE-ACETAMINOPHEN 5-325 MG PO TABS: 1.0000 | ORAL_TABLET | ORAL | Status: DC | PRN

## 2012-01-01 MED ORDER — ONDANSETRON 8 MG/NS 50 ML IVPB
8.0000 mg | INTRAVENOUS | Status: AC
  Administered 2012-01-01: 8 mg via INTRAVENOUS
  Filled 2012-01-01: qty 8

## 2012-01-01 NOTE — ED Notes (Signed)
Pt presents in no acute distress- active with vomiting clear bile fluid- diarrhea at the same time- Pt placed on c monitor and pulse ox for support- denies CP and SOB at present.  [place] was [place] then as well with no findings.  Has felt fine up until today

## 2012-01-01 NOTE — ED Notes (Signed)
WUJ:[id number]<JY> Expected date:01/01/12<BR> Expected time:11:09 AM<BR> Means of arrival:Ambulance<BR> Comments:<BR> N/v/d

## 2012-01-01 NOTE — ED Notes (Signed)
Family at bedside. 

## 2012-01-01 NOTE — ED Notes (Signed)
MD at bedside. 

## 2012-01-01 NOTE — ED Provider Notes (Signed)
History     CSN: [id number]  Arrival date & time 01/01/12  1133   First MD Initiated Contact with Patient 01/01/12 1146      Chief Complaint  Patient presents with  . Nausea  . Emesis    Patient is a 71 y.o. male presenting with vomiting.  Emesis  Pertinent negatives include no diarrhea.   the patient presents with weakness and vomiting.  He notes that this episode began this morning, approximately 7 hours ago.  Since onset has been persistently nauseous, with a numerable amounts of vomiting.  He also complains of chills, denies fever.  Denies confusion, endorses lightheadedness.  He denies diarrhea.  Last week he had a similar episode 2 his complaints today.  He was in [place], became acutely ill, requiring an ER visit there.  He notes that his labs, CT were done without acute findings.  He was discharged, and notes that he was essentially well until this morning.  Notably, the patient has a history of several similar prior events.  These seem to occur with some frequency, but stopped after a cholecystectomy approximately 4 years ago.   This morning, after the patient's symptoms were persistent, he checked his blood pressure, found the systolic value to be in the 80s, and was referred to this facility for evaluation.  Past Medical History  Diagnosis Date  . Hypertension   . Hypercholesteremia   . AAA (abdominal aortic aneurysm)   . MI (myocardial infarction)   . Pain     back pain chronic- seen at pain clinic  . Acid reflux   . Diabetes mellitus     Past Surgical History  Procedure Date  . Abdominal surgery   . Abdominal aortic aneurysm repair   . Brain surgery     No family history on file.  History  Substance Use Topics  . Smoking status: Not on file  . Smokeless tobacco: Not on file  . Alcohol Use:       Review of Systems  Constitutional:       Per HPI, otherwise negative  HENT:       Per HPI, otherwise negative  Eyes: Negative.   Respiratory:       Per HPI, otherwise negative  Cardiovascular:       Per HPI, otherwise negative  Gastrointestinal: Positive for vomiting. Negative for diarrhea and blood in stool.  Genitourinary: Negative.   Musculoskeletal:       Per HPI, otherwise negative  Skin: Negative.   Neurological: Negative for syncope.    Allergies  Review of patient's allergies indicates no known allergies.  Home Medications   Current Outpatient Rx  Name Route Sig Dispense Refill  . ASPIRIN EC 81 MG PO TBEC Oral Take 81 mg by mouth daily.    . ATORVASTATIN CALCIUM 10 MG PO TABS Oral Take 10 mg by mouth daily.    [name] BUPRENORPHINE 10 MCG/HR TD PTWK Transdermal Place 1 patch onto the skin every 7 (seven) days.    . COQ-10 100 MG PO CAPS Oral Take 1 capsule by mouth daily.    [name] FINASTERIDE 1 MG PO TABS Oral Take by mouth daily.    [name] METFORMIN HCL ER 500 MG PO TB24 Oral Take 500 mg by mouth 2 (two) times daily with breakfast and lunch.    . METOPROLOL TARTRATE 25 MG PO TABS Oral Take 25 mg by mouth 2 (two) times daily.    [name] PANTOPRAZOLE SODIUM 40 MG PO TBEC Oral  Take 40 mg by mouth 2 (two) times daily.    [name] PROMETHAZINE HCL 25 MG PO TABS Oral Take 25 mg by mouth every 6 (six) hours as needed.    [name] RAMIPRIL 10 MG PO CAPS Oral Take 10 mg by mouth daily.    . TRAMADOL HCL 50 MG PO TABS Oral Take 50 mg by mouth every 6 (six) hours as needed. For pain    . VITAMIN B-12 100 MCG PO TABS Oral Take 50 mcg by mouth daily.      BP 119/56  Pulse 91  Temp(Src) 97.4 F (36.3 C) (Oral)  Resp 16  SpO2 96%  Physical Exam  Nursing note and vitals reviewed. Constitutional: He is oriented to person, place, and time. He appears well-developed. No distress.  HENT:  Head: Normocephalic and atraumatic.  Eyes: Conjunctivae and EOM are normal.  Cardiovascular: Normal rate and regular rhythm.   Pulmonary/Chest: Effort normal. No stridor. No respiratory distress.    Abdominal: Soft. He exhibits no distension. There is no tenderness.  There is no rebound and no guarding.  Musculoskeletal: He exhibits no edema.  Neurological: He is alert and oriented to person, place, and time.  Skin: Skin is warm and dry.  Psychiatric: He has a normal mood and affect.    ED Course  Procedures (including critical care time)  Labs Reviewed  GLUCOSE, CAPILLARY - Abnormal; Notable for the following:    Glucose-Capillary 178 (*)    All other components within normal limits  COMPREHENSIVE METABOLIC PANEL  CBC  DIFFERENTIAL  LIPASE, BLOOD   No results found.   No diagnosis found.  Cardiac 111-st abnormal Pulse ox 99% ra -normal  XR abd, reviewed by me   Date: 01/01/2012  Rate: 97  Rhythm: normal sinus rhythm  QRS Axis: normal  Intervals: normal  ST/T Wave abnormalities: nonspecific T wave changes  Conduction Disutrbances:nonspecific intraventricular conduction delay  Narrative Interpretation:   Old EKG Reviewed: unchanged  ABNORMAL ECG  MDM  This 71 year old male presents with weakness, vomiting, hypotension (per his report).  On my exam the patient has minimally tender abdomen, no evidence of distress, but is uncomfortable appearing.  Patient's labs are notable for a leukocytosis with a left shift.  Patient's x-ray does not demonstrate acute pathology.  The patient's CT results are being obtained.  Given his description of weakness, although there is no clearly identifiable etiology, he'll be admitted for further evaluation and management.     Gerhard Munch, MD 01/02/12 [phone]

## 2012-01-01 NOTE — ED Notes (Signed)
Patient ambulated to bathroom with one person assist, no complaints of dizziness

## 2012-01-01 NOTE — ED Notes (Signed)
Report given to Victorino Dike, RN on 4 west, patient will be transferred to [id number] via stretcher by [employer]

## 2012-01-01 NOTE — ED Notes (Signed)
Per [facility]- pt has been seen several times for this presenting complaint.  Fluids and meds given  In route- #18  Gauge to left hand 300 NS given and zofran 4 mg IVP given with no relief- not vomiting at present- denies CP and any pain at present

## 2012-01-01 NOTE — ED Notes (Signed)
Patient transported to X-ray 

## 2012-01-01 NOTE — ED Notes (Signed)
Patient is resting comfortably. 

## 2012-01-01 NOTE — H&P (Signed)
Patient's PCP: Lillia Mountain, MD, MD Patient's cardiologist: Dr. Verdis Prime  Chief Complaint: Nausea, vomiting and diarrhea  History of Present Illness: [name] is a 71 y.o. Caucasian male with history of hypertension, hypercholesterolemia, abdominal aortic aneurysm, chronic low back pain, GERD, neuropathy, and obstructive sleep apnea on CPAP who presented with the above complaints.  Patient initially had symptoms on 12/26/2011 while at [facility], [place].  Patient had seafood and in the evening had nausea, vomiting and watery diarrhea.  Patient went to the local emergency department and was told his laboratory work was normal he was monitored in the emergency department, with improvement in symptoms with anti-medics patient was discharged.  After being discharged he reported that he did well with no nausea, vomiting or diarrhea.  However when he woke up this morning he felt nauseated, with vomiting and multiple episodes of watery diarrhea.  He had initially contacted his primary care physician who requested the patient come to the hospital for further evaluation.  Patient had x-ray of the chest and abdomen which was negative.  Laboratory work was unremarkable.  Given persistent nausea with weakness the hospitalist service was asked to admit the patient for further care and management.  Patient denies any fevers now has had chills over the last week.  Denies any chest pain or shortness of breath.  Denies any headaches or vision changes.  Denies any abdominal pain at this time.  Reports that his Cymbalta dose was recently increased from 15 to 30 mg prior to visit to [place].  Patient also reported that his blood pressure was low at home 80s/60s, but was normotensive in the emergency department.  Meds: Scheduled Meds:   . ondansetron      . ondansetron (ZOFRAN) IV  8 mg Intravenous To ER  . sodium chloride  1,000 mL Intravenous Once   Continuous Infusions:   . sodium  chloride     PRN Meds:. Allergies: Review of patient's allergies indicates no known allergies. Past Medical History  Diagnosis Date  . Hypertension   . Hypercholesteremia   . AAA (abdominal aortic aneurysm)   . MI (myocardial infarction)   . Pain     back pain chronic- seen at pain clinic  . Acid reflux   . Diabetes mellitus   . Complication of anesthesia     hair fell out  . Neuropathy     in legs  . Sleep apnea     uses cpap   Past Surgical History  Procedure Date  . Abdominal surgery   . Abdominal aortic aneurysm repair   . Brain surgery   . Back surgery 2013  . Colonoscopy    Family History  Problem Relation Age of Onset  . Diabetes Mother   . Coronary artery disease Mother   . Diabetes Father   . Arthritis Father   . Rheum arthritis Father   . Coronary artery disease Father    History   Social History  . Marital Status: Married    Spouse Name: N/A    Number of Children: N/A  . Years of Education: N/A   Occupational History  . Not on file.   Social History Main Topics  . Smoking status: Former Smoker    Quit date: 01/01/1992  . Smokeless tobacco: Never Used  . Alcohol Use: Yes     occasional  . Drug Use: No  . Sexually Active:    Other Topics Concern  . Not on file  Social History Narrative  . No narrative on file   Review of Systems: All systems reviewed with the patient and positive as per history of present illness, otherwise all other systems are negative.  Physical Exam: Blood pressure 90/52, pulse 106, temperature 97.4 F (36.3 C), temperature source Oral, resp. rate 16, SpO2 97.00%. General: Awake, Oriented x3, No acute distress. HEENT: EOMI, Moist mucous membranes Neck: Supple CV: S1 and S2 Lungs: Clear to ascultation bilaterally Abdomen: Soft, Nontender, Nondistended, +bowel sounds. Ext: Good pulses. Trace edema. No clubbing or cyanosis noted. Neuro: Cranial Nerves II-XII grossly intact. Has 5/5 motor strength in upper and  lower extremities.  Lab results:  [facility] 01/01/12 1236  NA 136  K 4.5  CL 99  CO2 28  GLUCOSE 202*  BUN 15  CREATININE 0.84  CALCIUM 10.2  MG --  PHOS --    Basename 01/01/12 1236  AST 26  ALT 28  ALKPHOS 78  BILITOT 0.6  PROT 7.4  ALBUMIN 4.0    Basename 01/01/12 1236  LIPASE 26  AMYLASE --    Basename 01/01/12 1236  WBC 17.3*  NEUTROABS 14.4*  HGB 15.8  HCT 45.5  MCV 90.3  PLT 165   No results found for this basename: CKTOTAL:3,CKMB:3,CKMBINDEX:3,TROPONINI:3 in the last 72 hours No components found with this basename: POCBNP:3 No results found for this basename: DDIMER in the last 72 hours No results found for this basename: HGBA1C:2 in the last 72 hours No results found for this basename: CHOL:2,HDL:2,LDLCALC:2,TRIG:2,CHOLHDL:2,LDLDIRECT:2 in the last 72 hours No results found for this basename: TSH,T4TOTAL,FREET3,T3FREE,THYROIDAB in the last 72 hours No results found for this basename: VITAMINB12:2,FOLATE:2,FERRITIN:2,TIBC:2,IRON:2,RETICCTPCT:2 in the last 72 hours Imaging results:  Dg Abd Acute W/chest  01/01/2012  *RADIOLOGY REPORT*  Clinical Data: Some diffuse abdominal pain with nausea vomiting. In the left  ACUTE ABDOMEN SERIES (ABDOMEN 2 VIEW & CHEST 1 VIEW)  Comparison: CT chest from 08/08/2011.  Chest x-ray from 08/08/2011.  Findings: Lung volumes are low with asymmetric elevation of right hemidiaphragm.  No edema or focal airspace consolidation. Cardiopericardial silhouette is at upper limits of normal for size. Telemetry leads overlie the chest.  Right-sided decubitus film shows no evidence for intraperitoneal free air.  Supine film shows no gaseous bowel dilatation to suggest obstruction.  No worrisome or suspicious abdominopelvic calcification.  Degenerative changes are seen in the mid lumbar spine.  IMPRESSION: No acute cardiopulmonary findings.  No evidence for bowel obstruction or perforation.  Original Report Authenticated By: ERIC A. MANSELL, M.D.    Other results: EKG: Sinus rhythm with multiple PVCs.  Assessment & Plan by Problem: 1. Nausea, vomiting, and diarrhea.  Etiology unclear at this time.  Patient had abdominal x-ray including chest which were negative.  Differential includes viral gastroenteritis versus food poisoning (given recent consumption of seafood) versus less likely C. difficile colitis (given recent antibiotic use).  Patient associates his nausea with recent dose increase of Cymbalta from 15 mg to 30 mg, we'll decrease the dose of Cymbalta back to 15 mg.  Will send for C. Diff PCR and stool culture.  Will gently hydrate the patient on IV fluids for supportive care.  Trying to obtain CT of the abdomen and pelvis performed at hospital in [facility], it cannot be obtained over the next 24 hours and if the symptoms persist consider reordering CT of abdomen and pelvis.  Consider GI evaluation if persistent symptoms.  Will start the patient on clear liquid diet and advance as tolerated.  Given  patient's history of poor artery disease will have the patient telemetry and trend troponins to rule the patient out for acute coronary syndrome.  2.  Diaphoresis.  Likely due to nausea and vomiting, management as indicated above.  3.  Hypertension with hypotension prior to admission.  Continue gentle hydration.  Continue metoprolol at current dose.  Decrease the dose of Ramipril.  If the patient continues to be hypotensive consider decreasing antihypertensive medication dosage.  4.  Hyperlipidemia.  Continue statin.  5.  GERD.  Stable at this time.  6.  Obstructive sleep apnea.  Continue CPAP.  7.  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  Continue sliding scale insulin.  Hold metformin.  Decrease the dose of Cymbalta given nausea for neuropathy.  8.  Leukocytosis.  Likely reactive.  Will send for urine analysis.  Chest x-ray negative for infectious etiology.  9.  History of coronary disease.   Continue aspirin and metoprolol.  10.  Prophylaxis.  Lovenox.  11.  CODE STATUS.  Full code.  Time spent on admission, talking to the patient, and coordinating care was: 60 mins.  Chung Chagoya A, MD 01/01/2012, 3:53 PM

## 2012-01-02 LAB — COMPREHENSIVE METABOLIC PANEL
ALT: 21 U/L (ref 0–53)
AST: 19 U/L (ref 0–37)
Albumin: 3.4 g/dL — ABNORMAL LOW (ref 3.5–5.2)
Alkaline Phosphatase: 61 U/L (ref 39–117)
BUN: 11 mg/dL (ref 6–23)
CO2: 30 mEq/L (ref 19–32)
Calcium: 8.9 mg/dL (ref 8.4–10.5)
Chloride: 102 mEq/L (ref 96–112)
Creatinine, Ser: 0.72 mg/dL (ref 0.50–1.35)
GFR calc Af Amer: >90 mL/min (ref >90)
GFR calc non Af Amer: >90 mL/min (ref >90)
Glucose, Bld: 155 mg/dL — ABNORMAL HIGH (ref 70–99)
Potassium: 3.6 mEq/L (ref 3.5–5.1)
Sodium: 137 mEq/L (ref 135–145)
Total Bilirubin: 0.9 mg/dL (ref 0.3–1.2)
Total Protein: 6.5 g/dL (ref 6.0–8.3)

## 2012-01-02 LAB — GLUCOSE, CAPILLARY
Glucose-Capillary: 178 mg/dL — ABNORMAL HIGH (ref 70–99)
Glucose-Capillary: 175 mg/dL — ABNORMAL HIGH (ref 70–99)

## 2012-01-02 LAB — CBC
HCT: 38.8 % — ABNORMAL LOW (ref 39.0–52.0)
Hemoglobin: 13.5 g/dL (ref 13.0–17.0)
MCH: 31.7 pg (ref 26.0–34.0)
MCHC: 34.8 g/dL (ref 30.0–36.0)
MCV: 91.1 fL (ref 78.0–100.0)
Platelets: 164 10*3/uL (ref 150–400)
RBC: 4.26 MIL/uL (ref 4.22–5.81)
RDW: 13.2 % (ref 11.5–15.5)
WBC: 7.3 10*3/uL (ref 4.0–10.5)

## 2012-01-02 LAB — URINE CULTURE
Colony Count: 6000
Culture  Setup Time: 201303070206

## 2012-01-02 LAB — CARDIAC PANEL(CRET KIN+CKTOT+MB+TROPI)
CK, MB: 3.7 ng/mL (ref 0.3–4.0)
CK, MB: 3.8 ng/mL (ref 0.3–4.0)
Relative Index: 3.2 — ABNORMAL HIGH (ref 0.0–2.5)
Relative Index: 3.3 — ABNORMAL HIGH (ref 0.0–2.5)
Total CK: 117 U/L (ref 7–232)
Total CK: 116 U/L (ref 7–232)
Troponin I: <0.3 ng/mL (ref <0.30)
Troponin I: <0.3 ng/mL (ref <0.30)

## 2012-01-02 LAB — LIPASE, BLOOD: Lipase: 14 U/L (ref 11–59)

## 2012-01-02 NOTE — Discharge Instructions (Addendum)
Aspiration Precautions Aspiration is the inhaling of a liquid or object into the lungs. Things that can be inhaled into the lungs include:  Food.   Any type of liquid, such as drinks or saliva.   Stomach contents, such as vomit or stomach acid.  When these things go into the lungs, damage can occur. Serious complications can then result, such as:  A lung infection (pneumonia).   A collection of pus in the lungs (lung abscess).  CAUSES  A decreased level of awareness (consciousness) due to:   Traumatic brain injury or head injury.   Stroke.   Neurological disease.   Seizures.   Decreased or absent gag reflex (inability to cough).   Medical conditions that affect swallowing.   Conditions that affect the food pipe (esophagus) such as a narrowing of the esophagus (esophageal stricture).   Gastroesophageal reflux (GERD). This is also known as acid reflux.   Any type of surgery where you are put under general anesthesia or have sedation.   Drinking large amounts of alcohol.   Taking medication that causes drowsiness, confusion, or weakness.   Aging.   Dental problems.   Having a feeding tube.  SYMPTOMS When aspiration occurs, different signs and symptoms can occur, such as:  Coughing (if a person has a cough or gag reflex) after swallowing food or liquids.   Difficulty breathing. This can include things like:   Breathing rapidly.   Breathing very slowly.   Hearing "gurgling" lung sounds when a person breaths.   Coughing up phlegm (sputum) that is:   Yellow, tan, or green in color.   Has pieces of food in it.   Bad smelling.   A change in voice (hoarseness).   A change in skin color. The skin may turn a "bluish" type color because of a lack of oxygen (cyanosis).   Fever.  DIAGNOSIS  A chest X-ray may be performed. This takes a picture of your lungs. It can show changes in the lungs if aspiration has occurred.   A bronchoscopy may be performed. This  is a surgical procedure in which a thin, flexible tube with a camera at the end is inserted into the nose or mouth. The tube is advanced to the lungs so your caregiver can view the lungs and obtain a culture, tissue sample, or remove an aspirated object.   A swallowing evaluation study may be performed to evaluate:   A person's risk of aspiration.   How difficult it is for a person to swallow.   What types of foods are safe for a person to eat.  PREVENTION If you are a caregiver to someone who may aspirate, follow the directions below. If you are caring for someone who can eat and drink through their mouth:  Have them sit in an upright position when eating food or drinking fluids, such as:   Sitting up in a chair.   If sitting in a chair is not possible, position the person in bed so they are upright.   Remind the person to eat slowly and chew well.   Do not distract the person. This is especially important for people with thinking or memory (cognitive) problems.   Check the person's mouth for leftover food after eating.   Keep the person sitting upright for 30 to 45 minutes after eating.   Do not serve food or drink for at least 2 hours before bedtime.  If you are caring for someone with a feeding tube and he  or she cannot eat or drink through their mouth:  Keep the person in an upright position as much as possible.   Do not  lay the person flat if they are getting continuous feedings. Turn the feeding pump off if you need to lay the person flat for any reason.   Check feeding tube residuals as directed by your caregiver. If a large amount of tube feedings are pulled back (aspirated) from the feeding tube, call your caregiver right away.  General guidelines to prevent aspiration include:  Feed small amounts of food. Do not force feed.   Use as little water as possible when brushing the person's teeth or cleaning his or her mouth.   Provide oral care before and after meals.     Never put food or fluids in the mouth of a person who is not fully alert.   Crush pills and put them in soft food such as pudding or ice cream. Some pills should not be crushed. Check with your caregiver before crushing any medication.  SEEK IMMEDIATE MEDICAL CARE IF:   The person has trouble breathing or starts to breathe rapidly.   The person is breathing very slowly or stops breathing.   The person coughs a lot after eating or drinking.   The person has a chronic cough.   The person coughs up thick, yellow, or tan sputum.   The person has a fever or persistent symptoms for more than 72 hours.   The person has a fever and their symptoms suddenly get worse.  Document Released: 11/16/2010 Document Revised: 10/03/2011 Document Reviewed: 11/16/2010 [facility] Patient Information 2012 [place], [place].Aspiration Precautions Aspiration is the inhaling of a liquid or object into the lungs. Things that can be inhaled into the lungs include:  Food.   Any type of liquid, such as drinks or saliva.   Stomach contents, such as vomit or stomach acid.  When these things go into the lungs, damage can occur. Serious complications can then result, such as:  A lung infection (pneumonia).   A collection of pus in the lungs (lung abscess).  CAUSES  A decreased level of awareness (consciousness) due to:   Traumatic brain injury or head injury.   Stroke.   Neurological disease.   Seizures.   Decreased or absent gag reflex (inability to cough).   Medical conditions that affect swallowing.   Conditions that affect the food pipe (esophagus) such as a narrowing of the esophagus (esophageal stricture).   Gastroesophageal reflux (GERD). This is also known as acid reflux.   Any type of surgery where you are put under general anesthesia or have sedation.   Drinking large amounts of alcohol.   Taking medication that causes drowsiness, confusion, or weakness.   Aging.   Dental problems.    Having a feeding tube.  SYMPTOMS When aspiration occurs, different signs and symptoms can occur, such as:  Coughing (if a person has a cough or gag reflex) after swallowing food or liquids.   Difficulty breathing. This can include things like:   Breathing rapidly.   Breathing very slowly.   Hearing "gurgling" lung sounds when a person breaths.   Coughing up phlegm (sputum) that is:   Yellow, tan, or green in color.   Has pieces of food in it.   Bad smelling.   A change in voice (hoarseness).   A change in skin color. The skin may turn a "bluish" type color because of a lack of oxygen (cyanosis).   Fever.  DIAGNOSIS  A chest X-ray may be performed. This takes a picture of your lungs. It can show changes in the lungs if aspiration has occurred.   A bronchoscopy may be performed. This is a surgical procedure in which a thin, flexible tube with a camera at the end is inserted into the nose or mouth. The tube is advanced to the lungs so your caregiver can view the lungs and obtain a culture, tissue sample, or remove an aspirated object.   A swallowing evaluation study may be performed to evaluate:   A person's risk of aspiration.   How difficult it is for a person to swallow.   What types of foods are safe for a person to eat.  PREVENTION If you are a caregiver to someone who may aspirate, follow the directions below. If you are caring for someone who can eat and drink through their mouth:  Have them sit in an upright position when eating food or drinking fluids, such as:   Sitting up in a chair.   If sitting in a chair is not possible, position the person in bed so they are upright.   Remind the person to eat slowly and chew well.   Do not distract the person. This is especially important for people with thinking or memory (cognitive) problems.   Check the person's mouth for leftover food after eating.   Keep the person sitting upright for 30 to 45 minutes  after eating.   Do not serve food or drink for at least 2 hours before bedtime.  If you are caring for someone with a feeding tube and he or she cannot eat or drink through their mouth:  Keep the person in an upright position as much as possible.   Do not  lay the person flat if they are getting continuous feedings. Turn the feeding pump off if you need to lay the person flat for any reason.   Check feeding tube residuals as directed by your caregiver. If a large amount of tube feedings are pulled back (aspirated) from the feeding tube, call your caregiver right away.  General guidelines to prevent aspiration include:  Feed small amounts of food. Do not force feed.   Use as little water as possible when brushing the person's teeth or cleaning his or her mouth.   Provide oral care before and after meals.   Never put food or fluids in the mouth of a person who is not fully alert.   Crush pills and put them in soft food such as pudding or ice cream. Some pills should not be crushed. Check with your caregiver before crushing any medication.  SEEK IMMEDIATE MEDICAL CARE IF:   The person has trouble breathing or starts to breathe rapidly.   The person is breathing very slowly or stops breathing.   The person coughs a lot after eating or drinking.   The person has a chronic cough.   The person coughs up thick, yellow, or tan sputum.   The person has a fever or persistent symptoms for more than 72 hours.   The person has a fever and their symptoms suddenly get worse.  Document Released: 11/16/2010 Document Revised: 10/03/2011 Document Reviewed: 11/16/2010 [facility] Patient Information 2012 [place], [place].Arterial Hypertension Arterial hypertension (high blood pressure) is a condition of elevated pressure in your blood vessels. Hypertension over a long period of time is a risk factor for strokes, heart attacks, and heart failure. It is also the leading cause of  kidney (renal) failure.   CAUSES   In Adults -- Over 90% of all hypertension has no known cause. This is called essential or primary hypertension. In the other 10% of people with hypertension, the increase in blood pressure is caused by another disorder. This is called secondary hypertension. Important causes of secondary hypertension are:   Heavy alcohol use.   Obstructive sleep apnea.   Hyperaldosterosim (Conn's syndrome).   Steroid use.   Chronic kidney failure.   Hyperparathyroidism.   Medications.   Renal artery stenosis.   Pheochromocytoma.   Cushing's disease.   Coarctation of the aorta.   Scleroderma renal crisis.   Licorice (in excessive amounts).   Drugs (cocaine, methamphetamine).  Your caregiver can explain any items above that apply to you.  In Children -- Secondary hypertension is more common and should always be considered.   Pregnancy -- Few women of childbearing age have high blood pressure. However, up to 10% of them develop hypertension of pregnancy. Generally, this will not harm the woman. It may be a sign of 3 complications of pregnancy: preeclampsia, HELLP syndrome, and eclampsia. Follow up and control with medication is necessary.  SYMPTOMS   This condition normally does not produce any noticeable symptoms. It is usually found during a routine exam.   Malignant hypertension is a late problem of high blood pressure. It may have the following symptoms:   Headaches.   Blurred vision.   End-organ damage (this means your kidneys, heart, lungs, and other organs are being damaged).   Stressful situations can increase the blood pressure. If a person with normal blood pressure has their blood pressure go up while being seen by their caregiver, this is often termed "white coat hypertension." Its importance is not known. It may be related with eventually developing hypertension or complications of hypertension.   Hypertension is often confused with mental tension, stress, and  anxiety.  DIAGNOSIS  The diagnosis is made by 3 separate blood pressure measurements. They are taken at least 1 week apart from each other. If there is organ damage from hypertension, the diagnosis may be made without repeat measurements. Hypertension is usually identified by having blood pressure readings:  Above 140/90 mmHg measured in both arms, at 3 separate times, over a couple weeks.   Over 130/80 mmHg should be considered a risk factor and may require treatment in patients with diabetes.  Blood pressure readings over 120/80 mmHg are called "pre-hypertension" even in non-diabetic patients. To get a true blood pressure measurement, use the following guidelines. Be aware of the factors that can alter blood pressure readings.  Take measurements at least 1 hour after caffeine.   Take measurements 30 minutes after smoking and without any stress. This is another reason to quit smoking - it raises your blood pressure.   Use a proper cuff size. Ask your caregiver if you are not sure about your cuff size.   Most home blood pressure cuffs are automatic. They will measure systolic and diastolic pressures. The systolic pressure is the pressure reading at the start of sounds. Diastolic pressure is the pressure at which the sounds disappear. If you are elderly, measure pressures in multiple postures. Try sitting, lying or standing.   Sit at rest for a minimum of 5 minutes before taking measurements.   You should not be on any medications like decongestants. These are found in many cold medications.   Record your blood pressure readings and review them with your caregiver.  If you have hypertension:  Your caregiver may do tests to be sure you do not have secondary hypertension (see "causes" above).   Your caregiver may also look for signs of metabolic syndrome. This is also called Syndrome X or Insulin Resistance Syndrome. You may have this syndrome if you have type 2 diabetes, abdominal obesity,  and abnormal blood lipids in addition to hypertension.   Your caregiver will take your medical and family history and perform a physical exam.   Diagnostic tests may include blood tests (for glucose, cholesterol, potassium, and kidney function), a urinalysis, or an EKG. Other tests may also be necessary depending on your condition.  PREVENTION  There are important lifestyle issues that you can adopt to reduce your chance of developing hypertension:  Maintain a normal weight.   Limit the amount of salt (sodium) in your diet.   Exercise often.   Limit alcohol intake.   Get enough potassium in your diet. Discuss specific advice with your caregiver.   Follow a DASH diet (dietary approaches to stop hypertension). This diet is rich in fruits, vegetables, and low-fat dairy products, and avoids certain fats.  PROGNOSIS  Essential hypertension cannot be cured. Lifestyle changes and medical treatment can lower blood pressure and reduce complications. The prognosis of secondary hypertension depends on the underlying cause. Many people whose hypertension is controlled with medicine or lifestyle changes can live a normal, healthy life.  RISKS AND COMPLICATIONS  While high blood pressure alone is not an illness, it often requires treatment due to its short- and long-term effects on many organs. Hypertension increases your risk for:  CVAs or strokes (cerebrovascular accident).   Heart failure due to chronically high blood pressure (hypertensive cardiomyopathy).   Heart attack (myocardial infarction).   Damage to the retina (hypertensive retinopathy).   Kidney failure (hypertensive nephropathy).  Your caregiver can explain list items above that apply to you. Treatment of hypertension can significantly reduce the risk of complications. TREATMENT   For overweight patients, weight loss and regular exercise are recommended. Physical fitness lowers blood pressure.   Mild hypertension is usually  treated with diet and exercise. A diet rich in fruits and vegetables, fat-free dairy products, and foods low in fat and salt (sodium) can help lower blood pressure. Decreasing salt intake decreases blood pressure in a 1/3 of people.   Stop smoking if you are a smoker.  The steps above are highly effective in reducing blood pressure. While these actions are easy to suggest, they are difficult to achieve. Most patients with moderate or severe hypertension end up requiring medications to bring their blood pressure down to a normal level. There are several classes of medications for treatment. Blood pressure pills (antihypertensives) will lower blood pressure by their different actions. Lowering the blood pressure by 10 mmHg may decrease the risk of complications by as much as 25%. The goal of treatment is effective blood pressure control. This will reduce your risk for complications. Your caregiver will help you determine the best treatment for you according to your lifestyle. What is excellent treatment for one person, may not be for you. HOME CARE INSTRUCTIONS   Do not smoke.   Follow the lifestyle changes outlined in the "Prevention" section.   If you are on medications, follow the directions carefully. Blood pressure medications must be taken as prescribed. Skipping doses reduces their benefit. It also puts you at risk for problems.   Follow up with your caregiver, as directed.   If you are asked to monitor your blood pressure  at home, follow the guidelines in the "Diagnosis" section above.  SEEK MEDICAL CARE IF:   You think you are having medication side effects.   You have recurrent headaches or lightheadedness.   You have swelling in your ankles.   You have trouble with your vision.  SEEK IMMEDIATE MEDICAL CARE IF:   You have sudden onset of chest pain or pressure, difficulty breathing, or other symptoms of a heart attack.   You have a severe headache.   You have symptoms of a  stroke (such as sudden weakness, difficulty speaking, difficulty walking).  MAKE SURE YOU:   Understand these instructions.   Will watch your condition.   Will get help right away if you are not doing well or get worse.  Document Released: 10/14/2005 Document Revised: 10/03/2011 Document Reviewed: 05/14/2007 [facility] Patient Information 2012 [place], [place].

## 2012-01-02 NOTE — Progress Notes (Signed)
01/02/12 patient is eager to go home. Patient was educated in discharge instructions.

## 2012-01-02 NOTE — Progress Notes (Signed)
Subjective: Feeling better, no N/V or diarrhea  Objective:3 Vital signs in last 24 hours: Temp:  [97.4 F (36.3 C)-98.6 F (37 C)] 98 F (36.7 C) (03/07 0513) Pulse Rate:  [71-107] 71  (03/07 0513) Resp:  [10-20] 18  (03/07 0513) BP: (85-120)/(45-74) 108/66 mmHg (03/07 0513) SpO2:  [92 %-100 %] 94 % (03/07 0513) Weight:  [96.163 kg (212 lb)] 96.163 kg (212 lb) (03/06 1917) Weight change:  Last BM Date: 01/01/12  Intake/Output from previous day: 03/06 0701 - 03/07 0700 In: 1620 [P.O.:120; I.V.:1500] Out: 202 [Urine:1; Emesis/NG output:200; Stool:1] Intake/Output this shift: Total I/O In: -  Out: 1 [Urine:1]  General appearance: alert and cooperative Resp: clear to auscultation bilaterally Cardio: regular rate and rhythm, S1, S2 normal, no murmur, click, rub or gallop GI: soft, non-tender; bowel sounds normal; no masses,  no organomegaly  Lab Results:  Basename 01/01/12 1236  WBC 17.3*  HGB 15.8  HCT 45.5  PLT 165   BMET  Basename 01/01/12 1236  NA 136  K 4.5  CL 99  CO2 28  GLUCOSE 202*  BUN 15  CREATININE 0.84  CALCIUM 10.2    Studies/Results: Dg Abd Acute W/chest  01/01/2012  *RADIOLOGY REPORT*  Clinical Data: Some diffuse abdominal pain with nausea vomiting. In the left  ACUTE ABDOMEN SERIES (ABDOMEN 2 VIEW & CHEST 1 VIEW)  Comparison: CT chest from 08/08/2011.  Chest x-ray from 08/08/2011.  Findings: Lung volumes are low with asymmetric elevation of right hemidiaphragm.  No edema or focal airspace consolidation. Cardiopericardial silhouette is at upper limits of normal for size. Telemetry leads overlie the chest.  Right-sided decubitus film shows no evidence for intraperitoneal free air.  Supine film shows no gaseous bowel dilatation to suggest obstruction.  No worrisome or suspicious abdominopelvic calcification.  Degenerative changes are seen in the mid lumbar spine.  IMPRESSION: No acute cardiopulmonary findings.  No evidence for bowel obstruction or  perforation.  Original Report Authenticated By: ERIC A. MANSELL, M.D.    Medications: I have reviewed the patient's current medications.  Assessment/Plan: Principal Problem:  *Nausea and vomiting  Differential drug related vs viral.  Feels better, advance diet, review old records, may have had side effects with cymbalta in the past.  Possible discharge later today Active Problems:  Hypertension  BP on low side, hold altace  Neuropathy  DM (diabetes mellitus), type 2, uncontrolled w/neurologic complication  SSI    LOS: 1 day   Temitope Flammer JOSEPH 01/02/2012, 5:59 AM

## 2012-01-02 NOTE — Discharge Summary (Signed)
Physician Discharge Summary  Patient ID: [name] MRN: [id number] DOB/AGE: [birth year] 71 y.o.  Admit date: 01/01/2012 Discharge date: 01/02/2012  Admission Diagnoses:nausea and vomiting Hypertension Diabetes mellitus Gastroesophageal reflux disease Coronary artery disease Obstructive sleep apnea Peripheral neuropathy Lumbar spinal stenosis  Discharge Diagnoses:  Principal Problem:  *Nausea and vomiting Active Problems:  Diaphoresis  Hypertension  Hypercholesteremia  Acid reflux  Neuropathy  Sleep apnea  DM (diabetes mellitus), type 2, uncontrolled w/neurologic complication  Leukocytosis   Discharged Condition: good  Hospital Course: the patient was admitted with nausea and vomiting and some diarrhea. He had been started on Cymbalta recently and the dose had just been titrated upward. He had not tolerated Cymbalta well in the past been tried in 2008. The medication was held and he was given IV fluids and supportive care. He felt much better by the next day with resolution of nausea, vomiting and diarrhea and tolerated regular diet without problem. He will be held off Cymbalta and discharged in good condition  Consults: None  Significant Diagnostic Studies: labs: see chart  Treatments: IV hydration  Discharge Exam: Blood pressure 108/66, pulse 71, temperature 98 F (36.7 C), temperature source Oral, resp. rate 18, height 6\' 1"  (1.854 m), weight 96.163 kg (212 lb), SpO2 94.00%. Resp: clear to auscultation bilaterally Cardio: regular rate and rhythm, S1, S2 normal, no murmur, click, rub or gallop GI: soft, non-tender; bowel sounds normal; no masses,  no organomegaly  Disposition: 01-Home or Self Care   Medication List  As of 01/02/2012  8:30 AM   STOP taking these medications         promethazine 25 MG tablet         TAKE these medications         aspirin EC 81 MG tablet   Take 81 mg by mouth daily.      atorvastatin 10 MG tablet   Commonly known as: LIPITOR   Take 10 mg by mouth daily.      BUTRANS 10 MCG/HR Ptwk   Generic drug: Buprenorphine   Place 1 patch onto the skin every 7 (seven) days.      CoQ-10 100 MG Caps   Take 1 capsule by mouth daily.      finasteride 1 MG tablet   Commonly known as: PROPECIA   Take by mouth daily.      metFORMIN 500 MG 24 hr tablet   Commonly known as: GLUCOPHAGE-XR   Take 500 mg by mouth 2 (two) times daily with breakfast and lunch.      metoprolol tartrate 25 MG tablet   Commonly known as: LOPRESSOR   Take 25 mg by mouth 2 (two) times daily.      pantoprazole 40 MG tablet   Commonly known as: PROTONIX   Take 40 mg by mouth 2 (two) times daily.      ramipril 10 MG capsule   Commonly known as: ALTACE   Take 10 mg by mouth daily.      traMADol 50 MG tablet   Commonly known as: ULTRAM   Take 50 mg by mouth every 6 (six) hours as needed. For pain      vitamin B-12 100 MCG tablet   Commonly known as: CYANOCOBALAMIN   Take 50 mcg by mouth daily.           Follow-up Information    Follow up with Lillia Mountain, MD in 6 weeks.         SignedLillia Mountain 01/02/2012,  8:30 AM

## 2012-01-05 LAB — STOOL CULTURE

## 2012-02-21 ENCOUNTER — Other Ambulatory Visit: Payer: Self-pay | Admitting: Neurological Surgery

## 2012-02-21 DIAGNOSIS — M48061 Spinal stenosis, lumbar region without neurogenic claudication: Secondary | ICD-10-CM

## 2012-02-28 ENCOUNTER — Ambulatory Visit
Admission: RE | Admit: 2012-02-28 | Discharge: 2012-02-28 | Disposition: A | Discharge status: Home / Self Care | Payer: Medicare Other | Source: Ambulatory Visit | Attending: Neurological Surgery | Admitting: Neurological Surgery

## 2012-02-28 DIAGNOSIS — M48061 Spinal stenosis, lumbar region without neurogenic claudication: Secondary | ICD-10-CM

## 2012-02-28 MED ORDER — GADOBENATE DIMEGLUMINE 529 MG/ML IV SOLN
20.0000 mL | Freq: Once | INTRAVENOUS | Status: AC | PRN
  Administered 2012-02-28: 20 mL via INTRAVENOUS

## 2012-03-09 ENCOUNTER — Other Ambulatory Visit: Payer: Self-pay | Admitting: Neurological Surgery

## 2012-04-20 ENCOUNTER — Other Ambulatory Visit: Payer: Self-pay

## 2012-04-23 ENCOUNTER — Encounter (HOSPITAL_COMMUNITY): Payer: Self-pay | Admitting: Pharmacy Technician

## 2012-04-27 ENCOUNTER — Encounter (HOSPITAL_COMMUNITY): Payer: Self-pay

## 2012-04-27 ENCOUNTER — Inpatient Hospital Stay (HOSPITAL_COMMUNITY): Admission: RE | Admit: 2012-04-27 | Discharge: 2012-04-27 | Payer: Medicare Other | Source: Ambulatory Visit

## 2012-04-27 HISTORY — DX: Incisional hernia without obstruction or gangrene: K43.2

## 2012-04-27 HISTORY — DX: Unspecified osteoarthritis, unspecified site: M19.90

## 2012-04-27 HISTORY — DX: Ileus, unspecified: K56.7

## 2012-04-27 NOTE — Pre-Procedure Instructions (Addendum)
20 [name]  04/27/2012   Your procedure is scheduled on:  May 05, 2012   Report to [facility] at 0530 AM.  Call this number if you have problems the morning of surgery: [phone]   Remember:   Do not eat food:After Midnight.      Take these medicines the morning of surgery with A SIP OF WATER: metoprolol, protonix, tramadol, butran, propecia  Discontinue aspirin and herbal medicines.  Day to stop 04/28/12   Do not wear jewelry, make-up or nail polish.  Do not wear lotions, powders, or perfumes. You may wear deodorant.  Do not shave 48 hours prior to surgery. Men may shave face and neck.  Do not bring valuables to the hospital.  Contacts, dentures or bridgework may not be worn into surgery.  Leave suitcase in the car. After surgery it may be brought to your room.  For patients admitted to the hospital, checkout time is 11:00 AM the day of discharge.   Patients discharged the day of surgery will not be allowed to drive home.  Name and phone number of your driver: family member/friend  Special Instructions: CHG Shower Shower 2 days before surgery and 1 day before surgery with Hibiclens.   Please read over the following fact sheets that you were given: Pain Booklet, Coughing and Deep Breathing, Blood Transfusion Information, MRSA Information and Surgical Site Infection Prevention

## 2012-04-28 ENCOUNTER — Encounter (HOSPITAL_COMMUNITY): Payer: Self-pay

## 2012-04-28 ENCOUNTER — Encounter (HOSPITAL_COMMUNITY)
Admission: RE | Admit: 2012-04-28 | Discharge: 2012-04-28 | Disposition: A | Discharge status: Home / Self Care | Payer: Medicare Other | Source: Ambulatory Visit | Attending: Neurological Surgery | Admitting: Neurological Surgery

## 2012-04-28 LAB — BASIC METABOLIC PANEL
BUN: 14 mg/dL (ref 6–23)
CO2: 28 mEq/L (ref 19–32)
Calcium: 10.4 mg/dL (ref 8.4–10.5)
Chloride: 98 mEq/L (ref 96–112)
Creatinine, Ser: 0.65 mg/dL (ref 0.50–1.35)
GFR calc Af Amer: >90 mL/min (ref >90)
GFR calc non Af Amer: >90 mL/min (ref >90)
Glucose, Bld: 270 mg/dL — ABNORMAL HIGH (ref 70–99)
Potassium: 4.1 mEq/L (ref 3.5–5.1)
Sodium: 135 mEq/L (ref 135–145)

## 2012-04-28 LAB — TYPE AND SCREEN
ABO/RH(D): AB POS
Antibody Screen: NEGATIVE

## 2012-04-28 LAB — CBC
HCT: 40.9 % (ref 39.0–52.0)
Hemoglobin: 14.5 g/dL (ref 13.0–17.0)
MCH: 31.7 pg (ref 26.0–34.0)
MCHC: 35.5 g/dL (ref 30.0–36.0)
MCV: 89.3 fL (ref 78.0–100.0)
Platelets: 166 10*3/uL (ref 150–400)
RBC: 4.58 MIL/uL (ref 4.22–5.81)
RDW: 12.6 % (ref 11.5–15.5)
WBC: 5.8 10*3/uL (ref 4.0–10.5)

## 2012-04-28 LAB — SURGICAL PCR SCREEN
MRSA, PCR: NEGATIVE
Staphylococcus aureus: NEGATIVE

## 2012-04-28 NOTE — Progress Notes (Signed)
Last sleep study > 10 yrs

## 2012-05-04 MED ORDER — CEFAZOLIN SODIUM-DEXTROSE 2-3 GM-% IV SOLR
2.0000 g | INTRAVENOUS | Status: AC
  Administered 2012-05-05 (×2): 2 g via INTRAVENOUS
  Filled 2012-05-04: qty 50

## 2012-05-05 ENCOUNTER — Inpatient Hospital Stay (HOSPITAL_COMMUNITY)
Admission: RE | Admit: 2012-05-05 | Discharge: 2012-05-14 | DRG: 460 | Disposition: A | Discharge status: Home Health | Payer: Medicare Other | Source: Ambulatory Visit | Attending: Neurological Surgery | Admitting: Neurological Surgery

## 2012-05-05 ENCOUNTER — Encounter (HOSPITAL_COMMUNITY): Payer: Self-pay | Admitting: *Deleted

## 2012-05-05 ENCOUNTER — Encounter (HOSPITAL_COMMUNITY): Payer: Self-pay | Admitting: Anesthesiology

## 2012-05-05 ENCOUNTER — Inpatient Hospital Stay (HOSPITAL_COMMUNITY): Payer: Medicare Other

## 2012-05-05 ENCOUNTER — Encounter (HOSPITAL_COMMUNITY)
Admission: RE | Disposition: A | Discharge status: Home Health | Payer: Self-pay | Source: Ambulatory Visit | Attending: Neurological Surgery

## 2012-05-05 ENCOUNTER — Inpatient Hospital Stay (HOSPITAL_COMMUNITY): Payer: Medicare Other | Admitting: Anesthesiology

## 2012-05-05 DIAGNOSIS — I509 Heart failure, unspecified: Secondary | ICD-10-CM | POA: Diagnosis present

## 2012-05-05 DIAGNOSIS — G589 Mononeuropathy, unspecified: Secondary | ICD-10-CM | POA: Diagnosis present

## 2012-05-05 DIAGNOSIS — R339 Retention of urine, unspecified: Secondary | ICD-10-CM | POA: Diagnosis present

## 2012-05-05 DIAGNOSIS — E119 Type 2 diabetes mellitus without complications: Secondary | ICD-10-CM | POA: Diagnosis present

## 2012-05-05 DIAGNOSIS — M47817 Spondylosis without myelopathy or radiculopathy, lumbosacral region: Principal | ICD-10-CM | POA: Diagnosis present

## 2012-05-05 DIAGNOSIS — E7800 Pure hypercholesterolemia, unspecified: Secondary | ICD-10-CM | POA: Diagnosis present

## 2012-05-05 DIAGNOSIS — Z9861 Coronary angioplasty status: Secondary | ICD-10-CM

## 2012-05-05 DIAGNOSIS — I2589 Other forms of chronic ischemic heart disease: Secondary | ICD-10-CM | POA: Diagnosis present

## 2012-05-05 DIAGNOSIS — K560 Paralytic ileus: Secondary | ICD-10-CM | POA: Diagnosis not present

## 2012-05-05 DIAGNOSIS — M48061 Spinal stenosis, lumbar region without neurogenic claudication: Secondary | ICD-10-CM

## 2012-05-05 DIAGNOSIS — I252 Old myocardial infarction: Secondary | ICD-10-CM

## 2012-05-05 DIAGNOSIS — R1084 Generalized abdominal pain: Secondary | ICD-10-CM

## 2012-05-05 DIAGNOSIS — D649 Anemia, unspecified: Secondary | ICD-10-CM | POA: Diagnosis present

## 2012-05-05 DIAGNOSIS — I5042 Chronic combined systolic (congestive) and diastolic (congestive) heart failure: Secondary | ICD-10-CM | POA: Diagnosis present

## 2012-05-05 DIAGNOSIS — I10 Essential (primary) hypertension: Secondary | ICD-10-CM | POA: Diagnosis present

## 2012-05-05 DIAGNOSIS — Z01812 Encounter for preprocedural laboratory examination: Secondary | ICD-10-CM

## 2012-05-05 DIAGNOSIS — I2510 Atherosclerotic heart disease of native coronary artery without angina pectoris: Secondary | ICD-10-CM | POA: Diagnosis present

## 2012-05-05 DIAGNOSIS — G4733 Obstructive sleep apnea (adult) (pediatric): Secondary | ICD-10-CM | POA: Diagnosis present

## 2012-05-05 DIAGNOSIS — I959 Hypotension, unspecified: Secondary | ICD-10-CM | POA: Diagnosis present

## 2012-05-05 LAB — GLUCOSE, CAPILLARY
Glucose-Capillary: 149 mg/dL — ABNORMAL HIGH (ref 70–99)
Glucose-Capillary: 236 mg/dL — ABNORMAL HIGH (ref 70–99)
Glucose-Capillary: 179 mg/dL — ABNORMAL HIGH (ref 70–99)

## 2012-05-05 SURGERY — POSTERIOR LUMBAR FUSION 2 LEVEL
Anesthesia: General | Site: Back | Wound class: Clean

## 2012-05-05 MED ORDER — NEOSTIGMINE METHYLSULFATE 1 MG/ML IJ SOLN
INTRAMUSCULAR | Status: DC | PRN
  Administered 2012-05-05: 5 mg via INTRAVENOUS

## 2012-05-05 MED ORDER — PANTOPRAZOLE SODIUM 40 MG PO TBEC
40.0000 mg | DELAYED_RELEASE_TABLET | Freq: Every day | ORAL | Status: DC
  Administered 2012-05-05 – 2012-05-14 (×8): 40 mg via ORAL
  Filled 2012-05-05 (×12): qty 1

## 2012-05-05 MED ORDER — DIPHENHYDRAMINE HCL 12.5 MG/5ML PO ELIX: 12.5000 mg | ORAL_SOLUTION | Freq: Four times a day (QID) | ORAL | Status: DC | PRN

## 2012-05-05 MED ORDER — SODIUM CHLORIDE 0.9 % IJ SOLN: 3.0000 mL | INTRAMUSCULAR | Status: DC | PRN

## 2012-05-05 MED ORDER — CEFAZOLIN SODIUM-DEXTROSE 2-3 GM-% IV SOLR
INTRAVENOUS | Status: AC
  Filled 2012-05-05: qty 50

## 2012-05-05 MED ORDER — ONDANSETRON HCL 4 MG/2ML IJ SOLN
4.0000 mg | INTRAMUSCULAR | Status: DC | PRN
  Administered 2012-05-06 – 2012-05-07 (×7): 4 mg via INTRAVENOUS
  Filled 2012-05-05 (×7): qty 2

## 2012-05-05 MED ORDER — ALUM & MAG HYDROXIDE-SIMETH 200-200-20 MG/5ML PO SUSP: 30.0000 mL | Freq: Four times a day (QID) | ORAL | Status: DC | PRN

## 2012-05-05 MED ORDER — DIPHENHYDRAMINE HCL 50 MG/ML IJ SOLN
12.5000 mg | Freq: Four times a day (QID) | INTRAMUSCULAR | Status: DC | PRN
  Administered 2012-05-10: 12.5 mg via INTRAVENOUS
  Filled 2012-05-05: qty 1

## 2012-05-05 MED ORDER — KETOROLAC TROMETHAMINE 30 MG/ML IJ SOLN
INTRAMUSCULAR | Status: AC
  Administered 2012-05-05: 30 mg
  Filled 2012-05-05: qty 1

## 2012-05-05 MED ORDER — THROMBIN 20000 UNITS EX KIT
PACK | CUTANEOUS | Status: DC | PRN
  Administered 2012-05-05: 20000 [IU] via TOPICAL

## 2012-05-05 MED ORDER — PHENOL 1.4 % MT LIQD: 1.0000 | OROMUCOSAL | Status: DC | PRN

## 2012-05-05 MED ORDER — PROPOFOL 10 MG/ML IV EMUL
INTRAVENOUS | Status: DC | PRN
  Administered 2012-05-05: 110 mg via INTRAVENOUS

## 2012-05-05 MED ORDER — SODIUM CHLORIDE 0.9 % IV SOLN
250.0000 mL | INTRAVENOUS | Status: DC
  Administered 2012-05-07: 250 mL via INTRAVENOUS

## 2012-05-05 MED ORDER — EPHEDRINE SULFATE 50 MG/ML IJ SOLN
INTRAMUSCULAR | Status: DC | PRN
  Administered 2012-05-05 (×2): 10 mg via INTRAVENOUS
  Administered 2012-05-05: 5 mg via INTRAVENOUS
  Administered 2012-05-05: 15 mg via INTRAVENOUS
  Administered 2012-05-05: 10 mg via INTRAVENOUS

## 2012-05-05 MED ORDER — ONDANSETRON HCL 4 MG/2ML IJ SOLN: 4.0000 mg | Freq: Four times a day (QID) | INTRAMUSCULAR | Status: DC | PRN

## 2012-05-05 MED ORDER — TRAMADOL HCL 50 MG PO TABS
50.0000 mg | ORAL_TABLET | Freq: Four times a day (QID) | ORAL | Status: DC | PRN
  Administered 2012-05-06 – 2012-05-14 (×11): 50 mg via ORAL
  Filled 2012-05-05 (×12): qty 1

## 2012-05-05 MED ORDER — HEPARIN SODIUM (PORCINE) 1000 UNIT/ML IJ SOLN
INTRAMUSCULAR | Status: AC
  Filled 2012-05-05: qty 1

## 2012-05-05 MED ORDER — LACTATED RINGERS IV SOLN
INTRAVENOUS | Status: DC | PRN
  Administered 2012-05-05 (×2): via INTRAVENOUS

## 2012-05-05 MED ORDER — METFORMIN HCL ER 500 MG PO TB24
500.0000 mg | ORAL_TABLET | Freq: Two times a day (BID) | ORAL | Status: DC
  Administered 2012-05-06 – 2012-05-14 (×10): 500 mg via ORAL
  Filled 2012-05-05 (×20): qty 1

## 2012-05-05 MED ORDER — NALOXONE HCL 0.4 MG/ML IJ SOLN: 0.4000 mg | INTRAMUSCULAR | Status: DC | PRN

## 2012-05-05 MED ORDER — HETASTARCH-ELECTROLYTES 6 % IV SOLN
INTRAVENOUS | Status: DC | PRN
  Administered 2012-05-05: 10:00:00 via INTRAVENOUS

## 2012-05-05 MED ORDER — SODIUM CHLORIDE 0.9 % IR SOLN
Status: DC | PRN
  Administered 2012-05-05: 09:00:00

## 2012-05-05 MED ORDER — GLYCOPYRROLATE 0.2 MG/ML IJ SOLN
INTRAMUSCULAR | Status: DC | PRN
  Administered 2012-05-05: 1 mg via INTRAVENOUS

## 2012-05-05 MED ORDER — RAMIPRIL 10 MG PO CAPS
10.0000 mg | ORAL_CAPSULE | Freq: Every day | ORAL | Status: DC
  Administered 2012-05-05 – 2012-05-14 (×9): 10 mg via ORAL
  Filled 2012-05-05 (×10): qty 1

## 2012-05-05 MED ORDER — MORPHINE SULFATE (PF) 1 MG/ML IV SOLN
INTRAVENOUS | Status: DC
Start: 2012-05-05 — End: 2012-05-07
  Administered 2012-05-05: 18 mg via INTRAVENOUS
  Administered 2012-05-05 – 2012-05-06 (×2): via INTRAVENOUS
  Administered 2012-05-06: 7 mg via INTRAVENOUS
  Administered 2012-05-06: 06:00:00 via INTRAVENOUS
  Administered 2012-05-06: 16 mg via INTRAVENOUS
  Administered 2012-05-06: 4 mg via INTRAVENOUS
  Administered 2012-05-07: 10 mg via INTRAVENOUS
  Administered 2012-05-07: 8 mg via INTRAVENOUS
  Administered 2012-05-07: 2 mg via INTRAVENOUS
  Filled 2012-05-05 (×3): qty 25

## 2012-05-05 MED ORDER — SODIUM CHLORIDE 0.9 % IV SOLN
INTRAVENOUS | Status: AC
  Filled 2012-05-05: qty 500

## 2012-05-05 MED ORDER — SODIUM CHLORIDE 0.9 % IJ SOLN: 9.0000 mL | INTRAMUSCULAR | Status: DC | PRN

## 2012-05-05 MED ORDER — ATORVASTATIN CALCIUM 10 MG PO TABS
10.0000 mg | ORAL_TABLET | Freq: Every day | ORAL | Status: DC
  Administered 2012-05-05 – 2012-05-13 (×8): 10 mg via ORAL
  Filled 2012-05-05 (×10): qty 1

## 2012-05-05 MED ORDER — MORPHINE SULFATE (PF) 1 MG/ML IV SOLN
INTRAVENOUS | Status: AC
  Administered 2012-05-05: 14:00:00
  Filled 2012-05-05: qty 25

## 2012-05-05 MED ORDER — HYDROMORPHONE HCL PF 1 MG/ML IJ SOLN
INTRAMUSCULAR | Status: AC
  Filled 2012-05-05: qty 1

## 2012-05-05 MED ORDER — MENTHOL 3 MG MT LOZG
1.0000 | LOZENGE | OROMUCOSAL | Status: DC | PRN
  Administered 2012-05-05: 3 mg via ORAL
  Filled 2012-05-05 (×2): qty 9

## 2012-05-05 MED ORDER — ACETAMINOPHEN 325 MG PO TABS
650.0000 mg | ORAL_TABLET | ORAL | Status: DC | PRN
  Administered 2012-05-06: 650 mg via ORAL
  Filled 2012-05-05: qty 2

## 2012-05-05 MED ORDER — ACETAMINOPHEN 650 MG RE SUPP: 650.0000 mg | RECTAL | Status: DC | PRN

## 2012-05-05 MED ORDER — SODIUM CHLORIDE 0.9 % IJ SOLN
3.0000 mL | Freq: Two times a day (BID) | INTRAMUSCULAR | Status: DC
  Administered 2012-05-05 – 2012-05-08 (×8): 3 mL via INTRAVENOUS

## 2012-05-05 MED ORDER — DIAZEPAM 5 MG PO TABS
5.0000 mg | ORAL_TABLET | Freq: Four times a day (QID) | ORAL | Status: DC | PRN
  Administered 2012-05-06 – 2012-05-14 (×16): 5 mg via ORAL
  Filled 2012-05-05 (×16): qty 1

## 2012-05-05 MED ORDER — PHENYLEPHRINE HCL 10 MG/ML IJ SOLN
10.0000 mg | INTRAVENOUS | Status: DC | PRN
  Administered 2012-05-05: 25 ug/min via INTRAVENOUS

## 2012-05-05 MED ORDER — KETOROLAC TROMETHAMINE 15 MG/ML IJ SOLN
15.0000 mg | Freq: Three times a day (TID) | INTRAMUSCULAR | Status: AC
  Administered 2012-05-05 – 2012-05-06 (×4): 15 mg via INTRAVENOUS
  Filled 2012-05-05 (×5): qty 1

## 2012-05-05 MED ORDER — KETOROLAC TROMETHAMINE 30 MG/ML IJ SOLN
INTRAMUSCULAR | Status: AC
  Administered 2012-05-06: 15 mg via INTRAVENOUS
  Filled 2012-05-05: qty 1

## 2012-05-05 MED ORDER — METOPROLOL TARTRATE 25 MG PO TABS
25.0000 mg | ORAL_TABLET | Freq: Two times a day (BID) | ORAL | Status: DC
  Administered 2012-05-05 – 2012-05-14 (×15): 25 mg via ORAL
  Filled 2012-05-05 (×19): qty 1

## 2012-05-05 MED ORDER — 0.9 % SODIUM CHLORIDE (POUR BTL) OPTIME
TOPICAL | Status: DC | PRN
  Administered 2012-05-05: 1000 mL

## 2012-05-05 MED ORDER — FENTANYL CITRATE 0.05 MG/ML IJ SOLN
INTRAMUSCULAR | Status: DC | PRN
  Administered 2012-05-05 (×3): 50 ug via INTRAVENOUS
  Administered 2012-05-05: 100 ug via INTRAVENOUS

## 2012-05-05 MED ORDER — LIDOCAINE-EPINEPHRINE 1 %-1:100000 IJ SOLN
INTRAMUSCULAR | Status: DC | PRN
  Administered 2012-05-05: 5 mL

## 2012-05-05 MED ORDER — MIDAZOLAM HCL 5 MG/5ML IJ SOLN
INTRAMUSCULAR | Status: DC | PRN
  Administered 2012-05-05: 2 mg via INTRAVENOUS

## 2012-05-05 MED ORDER — VECURONIUM BROMIDE 10 MG IV SOLR
INTRAVENOUS | Status: DC | PRN
  Administered 2012-05-05 (×6): 1 mg via INTRAVENOUS
  Administered 2012-05-05: 2 mg via INTRAVENOUS
  Administered 2012-05-05: 6 mg via INTRAVENOUS

## 2012-05-05 MED ORDER — BACITRACIN 50000 UNITS IM SOLR
INTRAMUSCULAR | Status: AC
  Filled 2012-05-05: qty 1

## 2012-05-05 MED ORDER — FINASTERIDE 1 MG PO TABS: 1.0000 mg | ORAL_TABLET | Freq: Every day | ORAL | Status: DC

## 2012-05-05 MED ORDER — ONDANSETRON HCL 4 MG/2ML IJ SOLN
INTRAMUSCULAR | Status: DC | PRN
  Administered 2012-05-05: 4 mg via INTRAVENOUS

## 2012-05-05 MED ORDER — CEFAZOLIN SODIUM 1-5 GM-% IV SOLN
1.0000 g | Freq: Three times a day (TID) | INTRAVENOUS | Status: AC
Start: 2012-05-05 — End: 2012-05-06
  Administered 2012-05-05 – 2012-05-06 (×2): 1 g via INTRAVENOUS
  Filled 2012-05-05 (×2): qty 50

## 2012-05-05 MED ORDER — LIDOCAINE HCL (CARDIAC) 20 MG/ML IV SOLN
INTRAVENOUS | Status: DC | PRN
  Administered 2012-05-05: 50 mg via INTRAVENOUS

## 2012-05-05 MED ORDER — HYDROMORPHONE HCL PF 1 MG/ML IJ SOLN
0.2500 mg | INTRAMUSCULAR | Status: DC | PRN
  Administered 2012-05-05 (×2): 0.5 mg via INTRAVENOUS

## 2012-05-05 MED ORDER — HEMOSTATIC AGENTS (NO CHARGE) OPTIME
TOPICAL | Status: DC | PRN
  Administered 2012-05-05: 1 via TOPICAL

## 2012-05-05 MED ORDER — SORBITOL 70 % SOLN
30.0000 mL | Freq: Every day | Status: DC | PRN
  Filled 2012-05-05: qty 30

## 2012-05-05 MED ORDER — BUPIVACAINE HCL (PF) 0.5 % IJ SOLN
INTRAMUSCULAR | Status: DC | PRN
  Administered 2012-05-05: 20 mL
  Administered 2012-05-05: 5 mL

## 2012-05-05 SURGICAL SUPPLY — 64 items
BAG DECANTER FOR FLEXI CONT (MISCELLANEOUS) ×2 IMPLANT
BLADE SURG ROTATE 9660 (MISCELLANEOUS) IMPLANT
BUR MATCHSTICK NEURO 3.0 LAGG (BURR) ×2 IMPLANT
CAGE 13MM (Cage) ×4 IMPLANT
CANISTER SUCTION 2500CC (MISCELLANEOUS) ×2 IMPLANT
CLOTH BEACON ORANGE TIMEOUT ST (SAFETY) ×2 IMPLANT
CONT SPEC 4OZ CLIKSEAL STRL BL (MISCELLANEOUS) ×4 IMPLANT
COVER BACK TABLE 24X17X13 BIG (DRAPES) IMPLANT
COVER TABLE BACK 60X90 (DRAPES) ×2 IMPLANT
DECANTER SPIKE VIAL GLASS SM (MISCELLANEOUS) ×2 IMPLANT
DERMABOND ADVANCED (GAUZE/BANDAGES/DRESSINGS) ×1
DERMABOND ADVANCED .7 DNX12 (GAUZE/BANDAGES/DRESSINGS) ×1 IMPLANT
DRAPE C-ARM 42X72 X-RAY (DRAPES) ×4 IMPLANT
DRAPE LAPAROTOMY 100X72X124 (DRAPES) ×2 IMPLANT
DRAPE POUCH INSTRU U-SHP 10X18 (DRAPES) ×2 IMPLANT
DRAPE PROXIMA HALF (DRAPES) ×2 IMPLANT
DURAPREP 26ML APPLICATOR (WOUND CARE) ×2 IMPLANT
ELECT REM PT RETURN 9FT ADLT (ELECTROSURGICAL) ×2
ELECTRODE REM PT RTRN 9FT ADLT (ELECTROSURGICAL) ×1 IMPLANT
GAUZE SPONGE 4X4 16PLY XRAY LF (GAUZE/BANDAGES/DRESSINGS) ×2 IMPLANT
GLOVE BIOGEL PI IND STRL 8 (GLOVE) ×1 IMPLANT
GLOVE BIOGEL PI IND STRL 8.5 (GLOVE) ×6 IMPLANT
GLOVE BIOGEL PI INDICATOR 8 (GLOVE) ×1
GLOVE BIOGEL PI INDICATOR 8.5 (GLOVE) ×6
GLOVE ECLIPSE 7.5 STRL STRAW (GLOVE) ×4 IMPLANT
GLOVE ECLIPSE 8.5 STRL (GLOVE) ×4 IMPLANT
GLOVE EXAM NITRILE LRG STRL (GLOVE) ×2 IMPLANT
GLOVE EXAM NITRILE MD LF STRL (GLOVE) IMPLANT
GLOVE EXAM NITRILE XL STR (GLOVE) IMPLANT
GLOVE EXAM NITRILE XS STR PU (GLOVE) IMPLANT
GLOVE SURG SS PI 8.0 STRL IVOR (GLOVE) ×12 IMPLANT
GOWN BRE IMP SLV AUR LG STRL (GOWN DISPOSABLE) IMPLANT
GOWN BRE IMP SLV AUR XL STRL (GOWN DISPOSABLE) ×2 IMPLANT
GOWN STRL REIN 2XL LVL4 (GOWN DISPOSABLE) ×8 IMPLANT
KIT BASIN OR (CUSTOM PROCEDURE TRAY) ×2 IMPLANT
KIT ROOM TURNOVER OR (KITS) ×2 IMPLANT
MILL MEDIUM DISP (BLADE) ×2 IMPLANT
NEEDLE HYPO 22GX1.5 SAFETY (NEEDLE) ×2 IMPLANT
NEEDLE SPNL 18GX3.5 QUINCKE PK (NEEDLE) IMPLANT
NS IRRIG 1000ML POUR BTL (IV SOLUTION) ×2 IMPLANT
PACK FOAM VITOSS 10CC (Orthopedic Implant) ×2 IMPLANT
PACK LAMINECTOMY NEURO (CUSTOM PROCEDURE TRAY) ×2 IMPLANT
PAD ARMBOARD 7.5X6 YLW CONV (MISCELLANEOUS) ×6 IMPLANT
PATTIES SURGICAL .5 X1 (DISPOSABLE) ×2 IMPLANT
PEEK PLIF NOVEL 9X25X10 (Peek) ×4 IMPLANT
ROD TI 5.5MM 5CM (Rod) ×4 IMPLANT
SCREW 35MM PEDICLE (Screw) ×4 IMPLANT
SCREW POLYAX 6.5X45MM (Screw) ×8 IMPLANT
SCREW SET SPINAL STD HEXALOBE (Screw) ×12 IMPLANT
SPONGE GAUZE 4X4 12PLY (GAUZE/BANDAGES/DRESSINGS) ×2 IMPLANT
SPONGE LAP 4X18 X RAY DECT (DISPOSABLE) ×2 IMPLANT
SPONGE SURGIFOAM ABS GEL 100 (HEMOSTASIS) ×2 IMPLANT
SUT VIC AB 1 CT1 18XBRD ANBCTR (SUTURE) ×2 IMPLANT
SUT VIC AB 1 CT1 8-18 (SUTURE) ×2
SUT VIC AB 2-0 CP2 18 (SUTURE) ×4 IMPLANT
SUT VIC AB 3-0 SH 8-18 (SUTURE) ×4 IMPLANT
SYR 20ML ECCENTRIC (SYRINGE) ×2 IMPLANT
SYR 3ML LL SCALE MARK (SYRINGE) ×6 IMPLANT
SYR 5ML LL (SYRINGE) IMPLANT
TOWEL OR 17X24 6PK STRL BLUE (TOWEL DISPOSABLE) ×2 IMPLANT
TOWEL OR 17X26 10 PK STRL BLUE (TOWEL DISPOSABLE) ×2 IMPLANT
TRAP SPECIMEN MUCOUS 40CC (MISCELLANEOUS) ×2 IMPLANT
TRAY FOLEY CATH 14FRSI W/METER (CATHETERS) ×2 IMPLANT
WATER STERILE IRR 1000ML POUR (IV SOLUTION) ×2 IMPLANT

## 2012-05-05 NOTE — Anesthesia Postprocedure Evaluation (Signed)
  Anesthesia Post-op Note  Patient: [name]  Procedure(s) Performed: Procedure(s) (LRB): POSTERIOR LUMBAR FUSION 2 LEVEL (N/A)  Patient Location: PACU  Anesthesia Type: General  Level of Consciousness: awake  Airway and Oxygen Therapy: Patient Spontanous Breathing and Patient connected to nasal cannula oxygen  Post-op Pain: moderate  Post-op Assessment: Post-op Vital signs reviewed, Patient's Cardiovascular Status Stable, Respiratory Function Stable, Patent Airway and No signs of Nausea or vomiting  Post-op Vital Signs: Reviewed and stable  Complications: No apparent anesthesia complications

## 2012-05-05 NOTE — Preoperative (Signed)
Beta Blockers   Reason not to administer Beta Blockers:Not Applicable. Lopressor @0430  05/05/12

## 2012-05-05 NOTE — Anesthesia Preprocedure Evaluation (Addendum)
Anesthesia Evaluation  Patient identified by MRN, date of birth, ID band Patient awake    Reviewed: Allergy & Precautions, H&P , NPO status , Patient's Chart, lab work & pertinent test results, reviewed documented beta blocker date and time   Airway Mallampati: I TM Distance: >3 FB Neck ROM: Full    Dental No notable dental hx. (+) Partial Lower, Partial Upper and Dental Advisory Given   Pulmonary sleep apnea and Continuous Positive Airway Pressure Ventilation ,  breath sounds clear to auscultation  Pulmonary exam normal       Cardiovascular hypertension, On Medications and On Home Beta Blockers + Past MI + dysrhythmias Atrial Fibrillation Rhythm:Regular Rate:Normal     Neuro/Psych negative neurological ROS  negative psych ROS   GI/Hepatic Neg liver ROS, GERD-  Medicated,  Endo/Other  negative endocrine ROSWell Controlled, Oral Hypoglycemic Agents  Renal/GU negative Renal ROS  negative genitourinary   Musculoskeletal   Abdominal   Peds  Hematology negative hematology ROS (+)   Anesthesia Other Findings   Reproductive/Obstetrics negative OB ROS                         Anesthesia Physical Anesthesia Plan  ASA: III  Anesthesia Plan: General   Post-op Pain Management:    Induction: Intravenous  Airway Management Planned: Oral ETT  Additional Equipment:   Intra-op Plan:   Post-operative Plan: Extubation in OR  Informed Consent: I have reviewed the patients History and Physical, chart, labs and discussed the procedure including the risks, benefits and alternatives for the proposed anesthesia with the patient or authorized representative who has indicated his/her understanding and acceptance.   Dental advisory given  Plan Discussed with: CRNA and Surgeon  Anesthesia Plan Comments:         Anesthesia Quick Evaluation

## 2012-05-05 NOTE — Progress Notes (Signed)
UR COMPLETED  

## 2012-05-05 NOTE — Progress Notes (Signed)
Pt. Started on low dose Morphine Pca due to age critera

## 2012-05-05 NOTE — Progress Notes (Signed)
Patient ID: [name], male   DOB: 1941-07-28, 71 y.o.   MRN: [id number] I will signs are stable motor function appears intact. Dressing is dry and intact. Patient appears comfortable. We'll start to mobilize in a.m. DC Foley catheter in a.m.

## 2012-05-05 NOTE — Transfer of Care (Signed)
Immediate Anesthesia Transfer of Care Note  Patient: [name]  Procedure(s) Performed: Procedure(s) (LRB): POSTERIOR LUMBAR FUSION 2 LEVEL (N/A)  Patient Location: PACU  Anesthesia Type: General  Level of Consciousness: sedated  Airway & Oxygen Therapy: Patient Spontanous Breathing and Patient connected to nasal cannula oxygen  Post-op Assessment: Report given to PACU RN and Post -op Vital signs reviewed and stable  Post vital signs: Reviewed and stable  Complications: No apparent anesthesia complications

## 2012-05-05 NOTE — Anesthesia Procedure Notes (Signed)
Procedure Name: Intubation Date/Time: 05/05/2012 7:46 AM Performed by: Garen Lah Pre-anesthesia Checklist: Patient identified, Emergency Drugs available, Suction available, Patient being monitored and Timeout performed Patient Re-evaluated:Patient Re-evaluated prior to inductionOxygen Delivery Method: Circle system utilized Preoxygenation: Pre-oxygenation with 100% oxygen Intubation Type: IV induction Ventilation: Mask ventilation without difficulty and Oral airway inserted - appropriate to patient size Laryngoscope Size: Mac and 3 Grade View: Grade II Tube type: Oral Tube size: 8.0 mm Number of attempts: 1 Airway Equipment and Method: Stylet Placement Confirmation: ETT inserted through vocal cords under direct vision,  positive ETCO2 and breath sounds checked- equal and bilateral Secured at: 22 cm Tube secured with: Tape Dental Injury: Teeth and Oropharynx as per pre-operative assessment

## 2012-05-05 NOTE — Op Note (Addendum)
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 Assistant: Sharyon Cable M.D. Anesthesia: Gen. endotracheal Indications: Patient is a 71 year old individual who's had significant problems with back pain bilateral lower extremity pain 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  Procedure: The patient was brought to the operating room. After the smooth induction of general endotracheal anesthesia the patient was carefully turned to the prone position padding the bony prominences appropriately. The back was prepped with alcohol and DuraPrep and draped in a sterile fashion. A midline incision was created and carried down to the lumbar dorsal fascia. Fascia was opened on either side of the midline to expose the spinous processes of the lower lumbar spine. These were identified as L4 and L5 on the radiograph. The dissection was then completed to expose L3-L5. A laminectomy was performed removing the entire laminar arch of L4. The takeoffs of the L3 and L4 nerve roots were exposed bilaterally severe stenosis was encountered at L3 and L4 and L5 secondary to thick and redundant ligamentous tissue. Previous  laminotomy site on the right side at L4-5 was encountered and scar was released in this area to identify and free the path of the L5 nerve root this was decompressed and a combination of curettes and rongeurs. The L4 nerve root had a long travel in its own separate sleeve under the pedicle of L4 and this was similarly decompressed L3 was decompressed superiorly removing a substantial amount of thick and redundant ligamentous material from the superior articular process of L4 where it abutted into the foramen on the right side. On the left side similar pathology was encountered. there was a previous laminotomy on the left side at L3-4 this area was decompressed and the nerve roots were freed and redundant scar was removed in this area which was causing severe stenosis for the individual nerve roots. The left-sided L3-L4 and L5 nerve roots were decompressed as the laminectomies were completed. The bone was harvested for use as autograft and was mixed with the cost allograft for packing in the interspaces.  Next the interspaces were decompressed performing total discectomies first at L3-4 there was noted to be a significant retrolisthesis of L3 on L4 in the disc space was evacuated completely using self-retaining distractors on one side while we worked on Vioxx it side to decompress the entirety of the disc space at L3-L4 ultimately peek spacers measuring 10 mm in height were placed into the interspace and filled with autograft and Vitoss allograft. At L4 L5-1 similar procedure was carried out. Here the disc space was also noted to be much larger and 13 mm peek spacers were placed into the interspace autograft and allograft was used to fill the interspace around the cages that were placed.  Pedicle entry sites were then chosen using fluoroscopic guidance at L4 and L5 6.5 x  45 mm screws were placed both left and right using a lateral to medial technique at L3 medial to lateral technique was used to place sit 5.5 x  35 mm screws into that vertebrae. Care was taken to make sure that no cut out had occurred.   60 mm Rods were then contoured appropriately and placed into each lateral gutter connecting the screws together. The lateral gutters were packed with a mixture of autograft and allograft. The system was torqued into final locking position. Vital radiographs confirm good position of the hardware and stable alignment of the spine. Wound was irrigated copiously with antibiotic irrigating solution and then closed with #1 Vicryl in interrupted fashion 20 cc of half percent Marcaine was injected into the paraspinous fascia at the time of closure. Subcutaneous tissues was clipped were closed with 2-0 Vicryl and 3-0 Vicryl is used to close subcuticular skin. Blood loss was estimated at 750 cc. 250 cc of Cell Saver blood were returned to the patient

## 2012-05-05 NOTE — H&P (Signed)
[name] is an 71 y.o. male.   Chief Complaint: Back and leg pain, weakness HPI: [name]  #[id number] DOB:  08/22/1941 I had the opportunity to review his MRI performed on 02/28/2012.  The study demonstrates that [name] has advanced spondylitic stenosis at L3-4 and L4-5.  I reviewed the study and noted that he had marked lateral recess stenosis for the L4 and the L3 nerve roots particularly.  I indicated that the biggest part of the stenosis is caused by facet hypertrophy in addition to the disc desiccation and loss of height that he has there.    We talked about surgical options for this process and [name] had already been aware of an anterolateral decompression such an XLIF technique.  I discussed this with him and noted that because he has such severe posterior hypertrophy, I would not be in favor of doing an anterolateral decompression indirectly. I believe the best way to decompress the nerve roots for [name]'s situation is via a formal laminectomy.  This would require removing the laminar arch and the facet joint nearly in its entirety and decompressing the exiting nerve root fully, and then also doing a posterior lumbar interbody arthrodesis at L3-4 and at L4-5.  Pedicle screws would then be placed from L3 to L5 to stabilize those two joints.    [name] asked a number of questions as, what would be the best potential outcome, what would be the worst potential outcome, and what is likely to happen.  I tried to answer them as best possible giving various scenarios.  [name] had mentioned that he had a bout of ileus after a triple A repair about three years ago and I noted that we would be concerned about the potential for another ileus even though this is back surgery and we will not be manipulating his small or large bowel.  The propensity for this tends to happen on individuals who have had extensive surgery in the past and we will try to do our best to minimize the chance of this occurring.     All those things being considered, I suggested to [name] that it is an appropriate time to consider surgical intervention for this process.  It is a substantial operation, but I was hoping that we could treat this conservatively for a good bit longer; however, it seems that the efforts to this point including two mild procedures in addition to the number of medications that have been tried by the pain management specialists have not been successful.  We will plan on scheduling the surgery at his convenience.  [name] did indicate that he may want to wait through the summer before scheduling and this is certainly not unreasonable if he can hold out for a bit longer.           Stefani Dama, M.D./[facility]   [name]   #[id number] DOB:  Mar 10, 1941    February 19, 2012   CHIEF COMPLAINT:   Pain in both lower extremities, weakness in the legs.    HISTORY OF PRESENT ILLNESS:  [name] is a 71 year old individual whom I have seen in the past.  I visited with him about a year ago and the last visit was actually in September of 2011.  At that time we had discussed some difficulties that he had with spondylosis and significant stenosis at L3-4 and 4-5.  He had some changes and findings for radiculitis on a  previous EMG and nerve conduction study done back in 2003 and [name] has been struggling with weakness in his legs and significant pain particularly in the right posterior and lateral thigh. He notes that the pain is down deep as it is to the bone and he notes that he has some weakness in his legs.  He finds that he wobbles when he walks and this is a problem that he cannot help.    PHYSICAL EXAMINATION:  On today's examination, I note that he has weakness in both tibialis anterior and in both gastrocnemii such that he cannot support his weight onto these muscles individually.  He also has some weakness in the quadriceps particularly on the right side where he has a great deal of  difficulty rising up onto the outstretched leg from a knee flexed position.  His left quadriceps is somewhat stronger.    DIAGNOSTIC STUDIES:   I have reviewed an MRI of his lumbar spine that was performed in January of 2012. This study demonstrates that the patient has evidence of significant spondylitic stenosis again at L3-4 and also at L4-5.  There is bilateral lateral recess stenosis at each of these levels.   I also have some notes from [place] where [name] was seen by [name] who has done a mild procedure, which is described as a minimally invasive lumbar decompression.  He notes that he got relief after the first mild procedure was done and he had some very transient relief after the second one.  He has not had any new imaging studies since then.    IMPRESSION:    I indicated to [name] that in order to work his condition up further, at this point he should have a repeat imaging study of the lumbar spine with an MRI.  He does have some long-standing diabetes which has been very well controlled and whether this is all related to a diabetic neuropathy would only be a diagnosis of exclusion. He does have substantial stenosis and if the stenotic condition is worsening, either L3-4 or L4-5 or at L2-3 where he had some very mild spondylitic stenosis previously, then we would want to   address these things very aggressively, particularly in light of his weakness.  We will see him back after the MRI is completed.    [facility]    Stefani Dama, M.D.   Past Medical History  Diagnosis Date  . Hypertension   . Hypercholesteremia   . AAA (abdominal aortic aneurysm)   . Pain     back pain chronic- seen at pain clinic  . Acid reflux   . Neuropathy     in legs  . MI (myocardial infarction)     1992 and 1997 sees Dr. Mendel Ryder as needed   . Bronchitis     hx of  . Sleep apnea     uses cpap   . Diabetes mellitus     oral medication  .  Complication of anesthesia     -years ago hair fell out.  [name] Dysrhythmia     "got out of rhythym a couple , but it straightened back out."  . Ileus   . Arthritis     BacK  . Incisional hernia     "small, from AAA"    Past Surgical History  Procedure Date  . Abdominal surgery   . Abdominal aortic aneurysm repair   . Colonoscopy   . Cardiac catheterization   .  Cholecystectomy   . Back surgery 2013    Miminal Invasive x2 in [place].    Family History  Problem Relation Age of Onset  . Diabetes Mother   . Coronary artery disease Mother   . Diabetes Father   . Arthritis Father   . Rheum arthritis Father   . Coronary artery disease Father    Social History:  reports that he quit smoking about 20 years ago. He has never used smokeless tobacco. He reports that he drinks alcohol. He reports that he does not use illicit drugs.  Allergies:  Allergies  Allergen Reactions  . Cymbalta (Duloxetine Hcl) Nausea And Vomiting  . Gabapentin Other (See Comments)  . Lyrica (Pregabalin) Other (See Comments)    Medications Prior to Admission  Medication Sig Dispense Refill  . aspirin EC 81 MG tablet Take 81 mg by mouth daily.      [name] atorvastatin (LIPITOR) 10 MG tablet Take 10 mg by mouth daily.      . Buprenorphine (BUTRANS) 10 MCG/HR PTWK Place 1 patch onto the skin every 7 (seven) days. Thursdays      . Coenzyme Q10 (COQ-10) 100 MG CAPS Take 3 capsules by mouth daily.       . finasteride (PROPECIA) 1 MG tablet Take by mouth daily.      . metFORMIN (GLUCOPHAGE-XR) 500 MG 24 hr tablet Take 500 mg by mouth 2 (two) times daily with breakfast and lunch.      . metoprolol tartrate (LOPRESSOR) 25 MG tablet Take 25 mg by mouth 2 (two) times daily.      . Multiple Vitamin (MULTIVITAMIN WITH MINERALS) TABS Take 1 tablet by mouth daily.      . pantoprazole (PROTONIX) 40 MG tablet Take 40 mg by mouth daily.       . ramipril (ALTACE) 10 MG capsule Take 10 mg by mouth daily.      . traMADol (ULTRAM)  50 MG tablet Take 50 mg by mouth every 6 (six) hours as needed. For pain      . vitamin B-12 (CYANOCOBALAMIN) 1000 MCG tablet Take 1,000 mcg by mouth every other day.        Results for orders placed during the hospital encounter of 05/05/12 (from the past 48 hour(s))  GLUCOSE, CAPILLARY     Status: Abnormal   Collection Time   05/05/12  6:32 AM      Component Value Range Comment   Glucose-Capillary 149 (*) 70 - 99 mg/dL    No results found.  Review of Systems  Constitutional: Negative.   HENT: Negative.   Eyes: Negative.   Respiratory: Negative.   Cardiovascular: Negative.   Gastrointestinal: Negative.   Genitourinary: Negative.   Musculoskeletal: Positive for myalgias and back pain.  Skin: Negative.   Neurological: Positive for focal weakness.  Psychiatric/Behavioral: Negative.     Blood pressure 113/67, pulse 67, temperature 98 F (36.7 C), temperature source Oral, resp. rate 18, SpO2 96.00%. Physical Exam  Constitutional: He is oriented to person, place, and time. He appears well-developed and well-nourished.  HENT:  Head: Normocephalic and atraumatic.  Eyes: Conjunctivae and EOM are normal. Pupils are equal, round, and reactive to light.  Neck: Normal range of motion.  Cardiovascular: Normal rate, regular rhythm and normal heart sounds.   Respiratory: Effort normal and breath sounds normal.  GI: Soft. Bowel sounds are normal.  Musculoskeletal: Normal range of motion.  Neurological: He is alert and oriented to person, place, and time. He displays abnormal reflex.  Skin: Skin is warm and dry.  Psychiatric: He has a normal mood and affect. His behavior is normal. Judgment and thought content normal.     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 Decompression L3-4 L4-5 with posterior lumbar interbody arthrodesis using peek spacers pedicle screw fixation L3-L5.  [name] Colpitt[place] J 05/05/2012, 7:33 AM

## 2012-05-06 ENCOUNTER — Encounter (HOSPITAL_COMMUNITY): Payer: Self-pay | Admitting: Cardiology

## 2012-05-06 LAB — CBC WITH DIFFERENTIAL/PLATELET
Basophils Absolute: 0 10*3/uL (ref 0.0–0.1)
Basophils Relative: 0 % (ref 0–1)
Eosinophils Absolute: 0 10*3/uL (ref 0.0–0.7)
Eosinophils Relative: 0 % (ref 0–5)
HCT: 30.7 % — ABNORMAL LOW (ref 39.0–52.0)
Hemoglobin: 10.7 g/dL — ABNORMAL LOW (ref 13.0–17.0)
Lymphocytes Relative: 5 % — ABNORMAL LOW (ref 12–46)
Lymphs Abs: 0.7 10*3/uL (ref 0.7–4.0)
MCH: 31.8 pg (ref 26.0–34.0)
MCHC: 34.9 g/dL (ref 30.0–36.0)
MCV: 91.1 fL (ref 78.0–100.0)
Monocytes Absolute: 2 10*3/uL — ABNORMAL HIGH (ref 0.1–1.0)
Monocytes Relative: 16 % — ABNORMAL HIGH (ref 3–12)
Neutro Abs: 10.3 10*3/uL — ABNORMAL HIGH (ref 1.7–7.7)
Neutrophils Relative %: 79 % — ABNORMAL HIGH (ref 43–77)
Platelets: 123 10*3/uL — ABNORMAL LOW (ref 150–400)
RBC: 3.37 MIL/uL — ABNORMAL LOW (ref 4.22–5.81)
RDW: 13 % (ref 11.5–15.5)
WBC: 12.9 10*3/uL — ABNORMAL HIGH (ref 4.0–10.5)

## 2012-05-06 LAB — POCT I-STAT 4, (NA,K, GLUC, HGB,HCT)
Glucose, Bld: 216 mg/dL — ABNORMAL HIGH (ref 70–99)
HCT: 33 % — ABNORMAL LOW (ref 39.0–52.0)
Hemoglobin: 11.2 g/dL — ABNORMAL LOW (ref 13.0–17.0)
Potassium: 4.6 mEq/L (ref 3.5–5.1)
Sodium: 138 mEq/L (ref 135–145)

## 2012-05-06 LAB — BASIC METABOLIC PANEL
BUN: 12 mg/dL (ref 6–23)
CO2: 27 mEq/L (ref 19–32)
Calcium: 8.5 mg/dL (ref 8.4–10.5)
Chloride: 96 mEq/L (ref 96–112)
Creatinine, Ser: 0.77 mg/dL (ref 0.50–1.35)
GFR calc Af Amer: >90 mL/min (ref >90)
GFR calc non Af Amer: 88 mL/min — ABNORMAL LOW (ref >90)
Glucose, Bld: 262 mg/dL — ABNORMAL HIGH (ref 70–99)
Potassium: 4.1 mEq/L (ref 3.5–5.1)
Sodium: 131 mEq/L — ABNORMAL LOW (ref 135–145)

## 2012-05-06 MED ORDER — LACTATED RINGERS IV BOLUS (SEPSIS)
250.0000 mL | Freq: Once | INTRAVENOUS | Status: AC
  Administered 2012-05-06: 250 mL via INTRAVENOUS

## 2012-05-06 MED ORDER — KETOROLAC TROMETHAMINE 30 MG/ML IJ SOLN
INTRAMUSCULAR | Status: AC
  Filled 2012-05-06: qty 1

## 2012-05-06 MED ORDER — SODIUM CHLORIDE 0.9 % IV BOLUS (SEPSIS)
500.0000 mL | Freq: Once | INTRAVENOUS | Status: AC
  Administered 2012-05-06: 500 mL via INTRAVENOUS

## 2012-05-06 MED ORDER — TAMSULOSIN HCL 0.4 MG PO CAPS
0.4000 mg | ORAL_CAPSULE | Freq: Every day | ORAL | Status: DC
  Administered 2012-05-06 – 2012-05-13 (×8): 0.4 mg via ORAL
  Filled 2012-05-06 (×10): qty 1

## 2012-05-06 NOTE — Progress Notes (Signed)
Occupational Therapy Treatment Patient Details Name: [name] MRN: [id number] DOB: 22-Oct-1941 Today's Date: 05/06/2012 Time: 1018-1030 OT Time Calculation (min): 12 min  OT Assessment / Plan / Recommendation Comments on Treatment Session pt limited by fatigue and nausea    Follow Up Recommendations  Home health OT;Supervision/Assistance - 24 hour    Barriers to Discharge       Equipment Recommendations  None recommended by PT (TBD by OT)    Recommendations for Other Services    Frequency Min 2X/week   Plan Discharge plan remains appropriate    Precautions / Restrictions Precautions Precautions: Back Precaution Booklet Issued: Yes (comment) Precaution Comments: Pt able to recall 1/3 back precautions; able to recall other 2 with Min VC's Required Braces or Orthoses: Spinal Brace Spinal Brace: Lumbar corset;Applied in sitting position Restrictions Weight Bearing Restrictions: No   Pertinent Vitals/Pain Pt reports pain with movement but did not rate; RN in room providing medication for nausea    ADL  Toilet Transfer: Simulated;+2 Total assistance Toilet Transfer: Patient Percentage: 60% Toilet Transfer Method: Stand pivot Toilet Transfer Equipment:  (EOB to chair) Transfers/Ambulation Related to ADLs: NT this session ADL Comments: Pt agreeable to OOB at this time, but declines ambulation outside of stand-pivot transfer. Pt currently unable to cross ankles over knees    OT Diagnosis: Generalized weakness;Acute pain  OT Problem List: Decreased activity tolerance;Impaired balance (sitting and/or standing);Decreased knowledge of use of DME or AE;Decreased knowledge of precautions;Pain;Cardiopulmonary status limiting activity OT Treatment Interventions: Self-care/ADL training;Therapeutic activities;Patient/family education;Balance training   OT Goals Acute Rehab OT Goals OT Goal Formulation: With patient Time For Goal Achievement: 05/13/12 Potential to Achieve Goals:  Good ADL Goals Pt Will Perform Grooming: with modified independence;Standing at sink ADL Goal: Grooming - Progress: Goal set today Pt Will Perform Upper Body Dressing: with set-up;Sitting, chair;Sitting, bed ADL Goal: Upper Body Dressing - Progress: Goal set today Pt Will Perform Lower Body Dressing: with mod assist;Sit to stand from bed;Sit to stand from chair ADL Goal: Lower Body Dressing - Progress: Goal set today Pt Will Transfer to Toilet: with DME;3-in-1;with modified independence ADL Goal: Toilet Transfer - Progress: Progressing toward goals Pt Will Perform Toileting - Hygiene: with modified independence;Standing at 3-in-1/toilet;Sit to stand from 3-in-1/toilet ADL Goal: Toileting - Hygiene - Progress: Goal set today Pt Will Perform Tub/Shower Transfer: Shower transfer;with min assist;Ambulation;with DME ADL Goal: Tub/Shower Transfer - Progress: Goal set today Additional ADL Goal #1: Pt will verbalize/generalize 3/3 back precautions in prep for functional activities ADL Goal: Additional Goal #1 - Progress: Progressing toward goals  Visit Information  Last OT Received On: 05/06/12 Assistance Needed: +2 (safety)    Subjective Data  Subjective: I think I need to stay in this chair a while longer Patient Stated Goal: Return home with assist of wife   Prior Functioning  Home Living Lives With: Spouse Available Help at Discharge: Family;Available 24 hours/day Type of Home: House Home Access: Stairs to enter [employer] of Steps: 3 Entrance Stairs-Rails: None Home Layout: Two level;Able to live on main level with bedroom/bathroom Bathroom Shower/Tub: Walk-in shower;Door [employer] Toilet: Standard Home Adaptive Equipment: Walker - rolling;Straight cane;Grab bars in shower;Grab bars around toilet Prior Function Level of Independence: Independent Able to Take Stairs?: Yes Driving: Yes Vocation: Retired Musician: No difficulties    Cognition   Overall Cognitive Status: Appears within functional limits for tasks assessed/performed Arousal/Alertness: Awake/alert Orientation Level: Appears intact for tasks assessed Behavior During Session: [facility] for tasks performed  Mobility Bed Mobility Bed Mobility: Sit to Sidelying Left;Rolling Right Rolling Right: 3: Mod assist;With rail Sit to Sidelying Left: 3: Mod assist;HOB flat;With rail Details for Bed Mobility Assistance: VC for sequencing; assist to bring bil LE into bed and to control roll onto back Transfers Sit to Stand: 4: Min assist;With upper extremity assist;With armrests;From chair/3-in-1 Stand to Sit: 4: Min assist;With upper extremity assist;To bed Details for Transfer Assistance: Assist to translate trunk anterior and slow descent with cues for safest hand placement to protect back.   Exercises    Balance    End of Session OT - End of Session Equipment Utilized During Treatment: Gait belt;Back brace Activity Tolerance: Patient limited by pain;Patient limited by fatigue Patient left: in bed;with call bell/phone within reach;with family/visitor present Nurse Communication: Mobility status  GO     [name] 05/06/2012, 1:59 PM

## 2012-05-06 NOTE — Progress Notes (Signed)
Patient's HR 110's-130's, no c/o CP, no lightheadedness, but c/o nausea and was treated with zofran. MD on call notified new orders received. Pt unable to void, bladder scan . In and out cath done with some difficulty and got yellow concentrated urine output. Will continue to monitor.

## 2012-05-06 NOTE — Progress Notes (Signed)
Inpatient Diabetes Program Recommendations  AACE/ADA: New Consensus Statement on Inpatient Glycemic Control (2009)  Target Ranges:  Prepandial:   less than 140 mg/dL      Peak postprandial:   less than 180 mg/dL (1-2 hours)      Critically ill patients:  140 - 180 mg/dL   Reason for Visit: Glucose monitoring needed  Inpatient Diabetes Program Recommendations Correction (SSI): Please order sensitive correction tidwc and check cbg's tidwc and HS  Note: Thank you, Lenor Coffin, RN, CNS, Diabetes Coordinator [phone])

## 2012-05-06 NOTE — Clinical Social Work Note (Signed)
CSW received consult for SNF. Discussed pt in progression with RN. PT is recommending home health services. RNCM is aware and following. CSW is signing off as no further needs identified. Please reconsult if a need arises prior to discharge.   Dede Query, MSW, Theresia Majors [phone]

## 2012-05-06 NOTE — Evaluation (Signed)
Occupational Therapy Evaluation Patient Details Name: CAMAR GUYTON MRN: [id number] DOB: 1941-10-15 Today's Date: 05/06/2012 Time: 4098-1191 OT Time Calculation (min): 10 min  OT Assessment / Plan / Recommendation Clinical Impression  Pt s/p L3-5 decompression and fusion and presents with pain, decreased activity tolerance and overall decreased I with ADL. Pt will benefit from skilled OT in the acute setting to maximize I with ADL and ADL mobility prior to d/c     OT Assessment  Patient needs continued OT Services    Follow Up Recommendations  Home health OT;Supervision/Assistance - 24 hour    Barriers to Discharge      Equipment Recommendations   (TBD- may need shower seat vs 3n1)    Recommendations for Other Services    Frequency  Min 2X/week    Precautions / Restrictions Precautions Precautions: Back Precaution Booklet Issued: Yes (comment) Precaution Comments: Educated on 3/3 back precautions. Required Braces or Orthoses: Spinal Brace Spinal Brace: Lumbar corset;Applied in sitting position Restrictions Weight Bearing Restrictions: No   Pertinent Vitals/Pain Pt reports 8/10 back pain with movement; pre-medicated. Pt also reports nausea with movement    ADL  Transfers/Ambulation Related to ADLs: NT this session ADL Comments: Pt declining OOB with therapy at this time as he has just gotten comfortable and wants to attempt to sit up longer. However, pt and wife with many questions; therefore, educated pt and wife on use of AE for LB dsg and bathing as well as need for DME at home. Re-educated pt on 3/3 back precautions and how these will effect IADLs at home- wife states she will be performing IADLs until pt recovers.     OT Diagnosis: Generalized weakness;Acute pain  OT Problem List: Decreased activity tolerance;Impaired balance (sitting and/or standing);Decreased knowledge of use of DME or AE;Decreased knowledge of precautions;Pain;Cardiopulmonary status limiting activity OT  Treatment Interventions: Self-care/ADL training;Therapeutic activities;Patient/family education;Balance training   OT Goals Acute Rehab OT Goals OT Goal Formulation: With patient Time For Goal Achievement: 05/13/12 Potential to Achieve Goals: Good ADL Goals Pt Will Perform Grooming: with modified independence;Standing at sink ADL Goal: Grooming - Progress: Goal set today Pt Will Perform Upper Body Dressing: with set-up;Sitting, chair;Sitting, bed ADL Goal: Upper Body Dressing - Progress: Goal set today Pt Will Perform Lower Body Dressing: with mod assist;Sit to stand from bed;Sit to stand from chair ADL Goal: Lower Body Dressing - Progress: Goal set today Pt Will Transfer to Toilet: with DME;3-in-1;with modified independence ADL Goal: Toilet Transfer - Progress: Goal set today Pt Will Perform Toileting - Hygiene: with modified independence;Standing at 3-in-1/toilet;Sit to stand from 3-in-1/toilet ADL Goal: Toileting - Hygiene - Progress: Goal set today Pt Will Perform Tub/Shower Transfer: Shower transfer;with min assist;Ambulation;with DME ADL Goal: Web designer - Progress: Goal set today Additional ADL Goal #1: Pt will verbalize/generalize 3/3 back precautions in prep for functional activities ADL Goal: Additional Goal #1 - Progress: Goal set today  Visit Information  Last OT Received On: 05/06/12 Assistance Needed: +2 PT/OT Co-Evaluation/Treatment: Yes    Subjective Data  Subjective: I think I need to stay in this chair a while longer Patient Stated Goal: Return home with assist of wife   Prior Functioning  Home Living Lives With: Spouse Available Help at Discharge: Family;Available 24 hours/day Type of Home: House Home Access: Stairs to enter [employer] of Steps: 3 Entrance Stairs-Rails: None Home Layout: Two level;Able to live on main level with bedroom/bathroom Bathroom Shower/Tub: Walk-in shower;Door [employer] Toilet: Standard Home Adaptive  Equipment: Dan Humphreys -  rolling;Straight cane;Grab bars in shower;Grab bars around toilet Prior Function Level of Independence: Independent Able to Take Stairs?: Yes Driving: Yes Vocation: Retired Musician: No difficulties    Cognition  Overall Cognitive Status: Appears within functional limits for tasks assessed/performed Arousal/Alertness: Awake/alert Orientation Level: Appears intact for tasks assessed Behavior During Session: [facility] for tasks performed    Extremity/Trunk Assessment Right Upper Extremity Assessment RUE ROM/Strength/Tone: Within functional levels RUE Sensation: WFL - Light Touch RUE Coordination: WFL - gross/fine motor Left Upper Extremity Assessment LUE ROM/Strength/Tone: Within functional levels LUE Sensation: WFL - Light Touch LUE Coordination: WFL - gross/fine motor   Mobility     Exercise    Balance    End of Session OT - End of Session Activity Tolerance: Patient limited by pain;Patient limited by fatigue Patient left: in chair;with call bell/phone within reach;with family/visitor present  GO     Sriram Febles 05/06/2012, 1:54 PM

## 2012-05-06 NOTE — Consult Note (Signed)
Admit date: 05/05/2012 Referring Physician  Dr. Danielle Dess  Primary Physician Lillia Mountain, MD Primary Cardiologist  Dr. 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  Early interventions included pain control and administration of IV fluids which are currently running at 100 mL per hour (note, EF 40%). Hemoglobin also decreased from 14.5 down to 10.7 post surgery.  I personally reviewed telemetry monitoring which showed PVC's mostly with no signs of atrial fibrillation. Heart rate while talking to him was slightly above 100. At rest in the 90s.  His blood pressure also has been slightly decreased at 94/52-102/51 in the most recent hours. He denies any shortness of breath, chest pain, palpitations. Denies any significant dizziness.  Once again last catheterization in January of 2012 demonstrated patent stents and chronic RCA occlusion with left to right collaterals.    PMH:   Past Medical History  Diagnosis Date  . Hypertension   . Hypercholesteremia   . AAA (abdominal aortic aneurysm)   . Pain     back pain chronic- seen at pain clinic  . Acid reflux   . Neuropathy     in legs  . MI (myocardial infarction)     1992 and 1997 sees Dr. Mendel Ryder as needed   . Bronchitis     hx of  . Sleep apnea     uses cpap   . Diabetes mellitus     oral  medication  . Complication of anesthesia     -years ago hair fell out.  [name] Dysrhythmia     "got out of rhythym a couple , but it straightened back out."  . Ileus   . Arthritis     BacK  . Incisional hernia     "small, from AAA"    PSH:   Past Surgical History  Procedure Date  . Abdominal surgery   . Abdominal aortic aneurysm repair   . Colonoscopy   . Cardiac catheterization   . Cholecystectomy   . Back surgery 2013    Miminal Invasive x2 in [place].   Allergies:  Cymbalta; Gabapentin; and Lyrica Prior to Admit Meds:   Prescriptions prior to admission  Medication Sig Dispense Refill  . aspirin EC 81 MG tablet Take 81 mg by mouth daily.      [name] atorvastatin (LIPITOR) 10 MG tablet Take 10 mg by mouth daily.      . Buprenorphine (BUTRANS) 10 MCG/HR PTWK Place 1 patch onto the skin every 7 (seven) days. Thursdays      . Coenzyme Q10 (COQ-10) 100 MG CAPS Take 3 capsules by mouth daily.       . finasteride (PROPECIA) 1 MG tablet Take by mouth daily.      . metFORMIN (GLUCOPHAGE-XR) 500 MG 24 hr tablet Take 500 mg by mouth 2 (two) times daily with breakfast and lunch.      . metoprolol tartrate (LOPRESSOR) 25 MG tablet Take 25 mg by mouth 2 (two) times daily.      [name]  Multiple Vitamin (MULTIVITAMIN WITH MINERALS) TABS Take 1 tablet by mouth daily.      . pantoprazole (PROTONIX) 40 MG tablet Take 40 mg by mouth daily.       . ramipril (ALTACE) 10 MG capsule Take 10 mg by mouth daily.      . traMADol (ULTRAM) 50 MG tablet Take 50 mg by mouth every 6 (six) hours as needed. For pain      . vitamin B-12 (CYANOCOBALAMIN) 1000 MCG tablet Take 1,000 mcg by mouth every other day.       Fam HX:    Family History  Problem Relation Age of Onset  . Diabetes Mother   . Coronary artery disease Mother   . Diabetes Father   . Arthritis Father   . Rheum arthritis Father   . Coronary artery disease Father    Social HX:    History   Social History  . Marital Status: Married    Spouse Name: N/A     Number of Children: N/A  . Years of Education: N/A   Occupational History  . Not on file.   Social History Main Topics  . Smoking status: Former Smoker    Quit date: 01/01/1992  . Smokeless tobacco: Never Used  . Alcohol Use: Yes     occasional  . Drug Use: No  . Sexually Active:    Other Topics Concern  . Not on file   Social History Narrative  . No narrative on file     ROS:  All 11 ROS were addressed and are negative except what is stated in the HPI  Physical Exam: Blood pressure 96/50, pulse 100, temperature 98.9 F (37.2 C), temperature source Oral, resp. rate 20, height 6\' 1"  (1.854 m), weight 96.48 kg (212 lb 11.2 oz), SpO2 100.00%.    General: Well developed, well nourished, in no acute distress, in bed  Head: Eyes PERRLA, No xanthomas.   Normal cephalic and atramatic  Lungs:   Clear bilaterally to auscultation and percussion. Normal respiratory effort. No wheezes, no rales. Heart:   HRRR S1 S2 Pulses are 2+ & equal. Occasional ectopy. Soft systolic murmur RUSB.           No carotid bruit. No JVD.  No abdominal bruits.  Abdomen: Bowel sounds are positive, abdomen soft and non-tender without masses. No hepatosplenomegaly. Msk:  Post op Extremities:   No clubbing, cyanosis or edema.  DP +1 Neuro: Alert and oriented X 3, non-focal, MAE x 4 GU: Deferred Rectal: Deferred Psych:  Good affect, responds appropriately    Labs:   Lab Results  Component Value Date   WBC 12.9* 05/06/2012   HGB 10.7* 05/06/2012   HCT 30.7* 05/06/2012   MCV 91.1 05/06/2012   PLT 123* 05/06/2012    Lab 05/06/12 0931  NA 131*  K 4.1  CL 96  CO2 27  BUN 12  CREATININE 0.77  CALCIUM 8.5  PROT --  BILITOT --  ALKPHOS --  ALT --  AST --  GLUCOSE 262*   No results found for this basename: PTT   Lab Results  Component Value Date   INR 1.08 11/25/2010   INR 1.4 03/31/2008   INR 1.0 03/29/2008   Lab Results  Component Value Date   CKTOTAL 116 01/02/2012   CKMB 3.8 01/02/2012    TROPONINI <0.30 01/02/2012     Lab Results  Component Value Date   CHOL  Value: 147        ATP  III CLASSIFICATION:  <200     mg/dL   Desirable  782-956  mg/dL   Borderline High  >=213    mg/dL   High        0/86/5784   CHOL  Value: 53        ATP III CLASSIFICATION:  <200     mg/dL   Desirable  696-295  mg/dL   Borderline High  >=284    mg/dL   High 1/32/4401   Lab Results  Component Value Date   HDL 29* 11/26/2010   Lab Results  Component Value Date   LDLCALC  Value: 92        Total Cholesterol/HDL:CHD Risk Coronary Heart Disease Risk Table                     Men   Women  1/2 Average Risk   3.4   3.3  Average Risk       5.0   4.4  2 X Average Risk   9.6   7.1  3 X Average Risk  23.4   11.0        Use the calculated Patient Ratio above and the CHD Risk Table to determine the patient's CHD Risk.        ATP III CLASSIFICATION (LDL):  <100     mg/dL   Optimal  027-253  mg/dL   Near or Above                    Optimal  130-159  mg/dL   Borderline  664-403  mg/dL   High  >474     mg/dL   Very High 2/59/5638   Lab Results  Component Value Date   TRIG 132 11/26/2010   TRIG 81 04/12/2008   Lab Results  Component Value Date   CHOLHDL 5.1 11/26/2010   No results found for this basename: LDLDIRECT    EKG:  Pending, prior from March demonstrates sinus rhythm with intraventricular conduction delay, inferior lateral infarct pattern, frequent PVCs. Personally viewed.   Echocardiogram from 2009-EF 45% with akinesis of the entire inferoposterior wall.  Catheterization from January of 2012 reviewed as above. Patent stents, occluded RCA with collaterals.  ASSESSMENT/PLAN:   71 year old male post inferior posterior myocardial infarction with mild to moderate LV dysfunction ejection fraction of 40-45% with coronary artery disease as described above with tachycardia/PVCs/hypotension.  1.Tachycardia  - He does seem to be responding to IV fluids currently running at 100 mL per hour. I would closely monitor  him for signs of fluid overload/dyspnea especially with his ejection fraction in the 40-45% range. I have told both he and his wife that if he does start to experience any evidence of dyspnea to notify personnel. It would likely be reasonable to discontinue his fluids tomorrow morning.  - Postsurgical discomfort may be causing tachycardia as well. He has been on metoprolol 25 mg twice a day at home and this is currently prescribed.  - Echocardiogram has been ordered to ensure that he is not having any further evidence of worsening left ventricular dysfunction  2. PVCs  - From prior EKG, it appears that he has had frequent PVCs in the past. No evidence of nonsustained ventricular tachycardia. Continue beta blocker as tolerated. Checking echocardiogram.  3. Hypotension  - He may need to hold his Altace. IV fluids are being administered. Blood loss from surgery noted but he is not severely anemic. Closely monitor.  4. CAD  -  Currently no complaints of angina.  5. 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 Checking echocardiogram. Both beta blocker and ACE inhibitor present.  - Watch for any signs of fluid overload/dyspnea with IV fluid administration  6. Diabetes  - Metformin  I will that Dr. Katrinka Blazing know that he is present.  Donato Schultz, MD  05/06/2012  1:11 PM

## 2012-05-06 NOTE — Evaluation (Addendum)
Physical Therapy Evaluation Patient Details Name: [name] MRN: [id number] DOB: October 04, 1941 Today's Date: 05/06/2012 Time: 0824-0904 PT Time Calculation (min): 40 min  PT Assessment / Plan / Recommendation Clinical Impression  Pt is a 71 y/o male admitted s/p L3-5 decompression and fusion along with the below PT problem list.  Pt would benefit from acute PT to maximize independence and facilitate d/c home with HHPT.    PT Assessment  Patient needs continued PT services    Follow Up Recommendations  Home health PT    Barriers to Discharge None      Equipment Recommendations  None recommended by PT    Recommendations for Other Services     Frequency Min 5X/week    Precautions / Restrictions Precautions Precautions: Back Precaution Booklet Issued: Yes (comment) Precaution Comments: Educated on 3/3 back precautions. Required Braces or Orthoses: Spinal Brace Spinal Brace: Lumbar corset;Applied in sitting position Restrictions Weight Bearing Restrictions: No   Pertinent Vitals/Pain 5/10 in back.  Pt repositioned with PCA encouraged.      Mobility  Bed Mobility Bed Mobility: Rolling Left;Left Sidelying to Sit Rolling Left: 4: Min assist;With rail Left Sidelying to Sit: 4: Min assist;With rails;HOB flat Details for Bed Mobility Assistance: Assist to rotate trunk and translate to midline with cues for safest sequence to protect back. Transfers Transfers: Stand to Sit;Sit to Stand Sit to Stand: 4: Min assist;With upper extremity assist;From bed Stand to Sit: 4: Min assist;With upper extremity assist;To chair/3-in-1 Details for Transfer Assistance: Assist to translate trunk anterior and slow descent with cues for safest hand placement to protect back. Ambulation/Gait Ambulation/Gait Assistance: 4: Min assist Ambulation Distance (Feet): 2 Feet Assistive device: Rolling walker Ambulation/Gait Assistance Details: Assist for balance and to advance RW with cues for tall posture.   Distance limited by nausea and slight dizziness. Gait Pattern: Decreased stride length;Antalgic;Trunk flexed Stairs: No Wheelchair Mobility Wheelchair Mobility: No    Exercises     PT Diagnosis: Difficulty walking;Acute pain  PT Problem List: Decreased strength;Decreased activity tolerance;Decreased balance;Decreased mobility;Decreased knowledge of use of DME;Decreased knowledge of precautions;Pain PT Treatment Interventions: DME instruction;Gait training;Stair training;Functional mobility training;Therapeutic activities;Balance training;Patient/family education   PT Goals Acute Rehab PT Goals PT Goal Formulation: With patient/family Time For Goal Achievement: 05/13/12 Potential to Achieve Goals: Good Pt will Roll Supine to Right Side: with modified independence PT Goal: Rolling Supine to Right Side - Progress: Goal set today Pt will Roll Supine to Left Side: with modified independence PT Goal: Rolling Supine to Left Side - Progress: Goal set today Pt will go Supine/Side to Sit: with modified independence PT Goal: Supine/Side to Sit - Progress: Goal set today Pt will go Sit to Supine/Side: with modified independence PT Goal: Sit to Supine/Side - Progress: Goal set today Pt will go Sit to Stand: with modified independence PT Goal: Sit to Stand - Progress: Goal set today Pt will go Stand to Sit: with modified independence PT Goal: Stand to Sit - Progress: Goal set today Pt will Ambulate: 51 - 150 feet;with modified independence;with least restrictive assistive device PT Goal: Ambulate - Progress: Goal set today Pt will Go Up / Down Stairs: 3-5 stairs;with min assist;with least restrictive assistive device PT Goal: Up/Down Stairs - Progress: Goal set today  Visit Information  Last PT Received On: 05/06/12 Assistance Needed: +2 (Safety)    Subjective Data  Subjective: "I feel so weak." Patient Stated Goal: Go home.   Prior Functioning  Home Living Lives With:  Spouse Available Help  at Discharge: Family Type of Home: House Home Access: Stairs to enter [employer] of Steps: 3 Entrance Stairs-Rails: None Home Layout: Two level;Able to live on main level with bedroom/bathroom Bathroom Shower/Tub: Walk-in shower;Door [employer] Toilet: Standard Home Adaptive Equipment: Environmental consultant - rolling;Straight cane;Grab bars in shower;Grab bars around toilet Prior Function Level of Independence: Independent Able to Take Stairs?: Yes Driving: Yes Vocation: Retired Musician: No difficulties    Cognition  Overall Cognitive Status: Appears within functional limits for tasks assessed/performed Arousal/Alertness: Awake/alert Orientation Level: Appears intact for tasks assessed Behavior During Session: [facility] for tasks performed    Extremity/Trunk Assessment Right Upper Extremity Assessment RUE ROM/Strength/Tone: Within functional levels RUE Sensation: WFL - Light Touch RUE Coordination: WFL - gross/fine motor Left Upper Extremity Assessment LUE ROM/Strength/Tone: Within functional levels LUE Sensation: WFL - Light Touch LUE Coordination: WFL - gross/fine motor Right Lower Extremity Assessment RLE ROM/Strength/Tone: Deficits;Due to pain RLE ROM/Strength/Tone Deficits: 3/5 throughout. RLE Sensation: Deficits RLE Sensation Deficits: Decreased throughout to light touch. RLE Coordination: WFL - gross motor Left Lower Extremity Assessment LLE ROM/Strength/Tone: Deficits;Due to pain LLE ROM/Strength/Tone Deficits: 3/5 throughout. LLE Sensation: Deficits LLE Sensation Deficits: Decreased to light touch throughout. LLE Coordination: WFL - gross motor Trunk Assessment Trunk Assessment: Normal   Balance Balance Balance Assessed: No  End of Session PT - End of Session Equipment Utilized During Treatment: Gait belt;Back brace Activity Tolerance: Patient limited by pain;Other (comment) (Limited by nausea and dizziness.) Patient left: in  chair;with call bell/phone within reach;with family/visitor present Nurse Communication: Mobility status  GP     Cephus Shelling 05/06/2012, 9:17 AM  05/06/2012 Cephus Shelling, PT, DPT [phone]

## 2012-05-06 NOTE — Progress Notes (Signed)
*  PRELIMINARY RESULTS* Echocardiogram 2D Echocardiogram has been performed.  [name] 05/06/2012, 4:47 PM

## 2012-05-06 NOTE — Progress Notes (Signed)
Subjective: Patient reports nausea feels poorly has had urinary retention overnight. Generally uncomfortable  Objective: Vital signs in last 24 hours: Temp:  [97.7 F (36.5 C)-100.1 F (37.8 C)] 98.4 F (36.9 C) (07/10 0905) Pulse Rate:  [89-119] 97  (07/10 0905) Resp:  [14-40] 20  (07/10 0905) BP: (94-128)/(51-69) 102/51 mmHg (07/10 0905) SpO2:  [42 %-100 %] 98 % (07/10 0905) Weight:  [96.48 kg (212 lb 11.2 oz)] 96.48 kg (212 lb 11.2 oz) (07/09 2355)  Intake/Output from previous day: 07/09 0701 - 07/10 0700 In: 2410 [I.V.:1400; Blood:260; IV Piggyback:750] Out: 2360 [Urine:1360; Blood:1000] Intake/Output this shift:    Dressing is dry and intact motor function is good in lower extremities. Heart tachycardic lungs are clear  Lab Results: No results found for this basename: WBC:2,HGB:2,HCT:2,PLT:2 in the last 72 hours BMET No results found for this basename: NA:2,K:2,CL:2,CO2:2,GLUCOSE:2,BUN:2,CREATININE:2,CALCIUM:2 in the last 72 hours  Studies/Results: Dg Lumbar Spine 2-3 Views  05/05/2012  *RADIOLOGY REPORT*  Clinical Data: L3-L5 PLIF.  OPERATIVE LUMBAR SPINE - 2-3 VIEW  Comparison: MRI lumbar spine 02/28/2012 [facility].  Findings: Images were submitted for interpretation post- operatively.  The same numbering scheme will be utilized as on the prior MRI, with L5 representing the last lumbar segment.  Initial image at 0830 hours demonstrates clamps on the spinous processes of L4 and L5.  Second image at 0910 hours demonstrates the localizer probe projected over the L3-4 disc space.  IMPRESSION: L3-4 localized intraoperatively.  Original Report Authenticated By: Arnell Sieving, M.D.   Dg Lumbar Spine 2-3 Views  05/05/2012  *RADIOLOGY REPORT*  Clinical Data: L3-L5 FIXATION.  DG C-ARM 1-60 MIN,LUMBAR SPINE - 2-3 VIEW  Comparison: Intraoperative imaging of same date.  MRI of 02/28/2012.  Findings: Intraoperative AP and lateral images.  These demonstrate trans pedicle screw  fixation at 3 levels.  Favored to be L3-L5. The lateral view is degraded by underpenetration.  Interbody fusion material centrally positioned.  No gross acute hardware complication identified.  IMPRESSION: Intraoperative imaging of lumbar fixation, likely L3-L5.  Original Report Authenticated By: Consuello Bossier, M.D.   Dg C-arm 1-60 Min  05/05/2012  *RADIOLOGY REPORT*  Clinical Data: L3-L5 FIXATION.  DG C-ARM 1-60 MIN,LUMBAR SPINE - 2-3 VIEW  Comparison: Intraoperative imaging of same date.  MRI of 02/28/2012.  Findings: Intraoperative AP and lateral images.  These demonstrate trans pedicle screw fixation at 3 levels.  Favored to be L3-L5. The lateral view is degraded by underpenetration.  Interbody fusion material centrally positioned.  No gross acute hardware complication identified.  IMPRESSION: Intraoperative imaging of lumbar fixation, likely L3-L5.  Original Report Authenticated By: Consuello Bossier, M.D.    Assessment/Plan: Likely volume depleted postop day 1 will increase IV rate check CBC and be met. Tolerate request Dr. Garnette Scheuermann his cardiologist to see and evaluate. May need transfusion if hematocrit extremely low. Replace Foley catheter the patient cannot void  LOS: 1 day  See above   [name] J 05/06/2012, 9:10 AM

## 2012-05-07 ENCOUNTER — Inpatient Hospital Stay (HOSPITAL_COMMUNITY): Payer: Medicare Other

## 2012-05-07 DIAGNOSIS — R112 Nausea with vomiting, unspecified: Secondary | ICD-10-CM

## 2012-05-07 DIAGNOSIS — R1084 Generalized abdominal pain: Secondary | ICD-10-CM

## 2012-05-07 DIAGNOSIS — K560 Paralytic ileus: Secondary | ICD-10-CM | POA: Diagnosis not present

## 2012-05-07 LAB — BASIC METABOLIC PANEL
BUN: 10 mg/dL (ref 6–23)
CO2: 29 mEq/L (ref 19–32)
Calcium: 8.9 mg/dL (ref 8.4–10.5)
Chloride: 99 mEq/L (ref 96–112)
Creatinine, Ser: 0.73 mg/dL (ref 0.50–1.35)
GFR calc Af Amer: >90 mL/min (ref >90)
GFR calc non Af Amer: 90 mL/min — ABNORMAL LOW (ref >90)
Glucose, Bld: 226 mg/dL — ABNORMAL HIGH (ref 70–99)
Potassium: 4.1 mEq/L (ref 3.5–5.1)
Sodium: 135 mEq/L (ref 135–145)

## 2012-05-07 LAB — CBC
HCT: 29.9 % — ABNORMAL LOW (ref 39.0–52.0)
Hemoglobin: 10.5 g/dL — ABNORMAL LOW (ref 13.0–17.0)
MCH: 31.7 pg (ref 26.0–34.0)
MCHC: 35.1 g/dL (ref 30.0–36.0)
MCV: 90.3 fL (ref 78.0–100.0)
Platelets: 127 10*3/uL — ABNORMAL LOW (ref 150–400)
RBC: 3.31 MIL/uL — ABNORMAL LOW (ref 4.22–5.81)
RDW: 12.8 % (ref 11.5–15.5)
WBC: 11.8 10*3/uL — ABNORMAL HIGH (ref 4.0–10.5)

## 2012-05-07 LAB — PRO B NATRIURETIC PEPTIDE: Pro B Natriuretic peptide (BNP): 636.7 pg/mL — ABNORMAL HIGH (ref 0–125)

## 2012-05-07 MED ORDER — KCL IN DEXTROSE-NACL 20-5-0.9 MEQ/L-%-% IV SOLN
INTRAVENOUS | Status: DC
  Administered 2012-05-07 – 2012-05-12 (×10): via INTRAVENOUS
  Filled 2012-05-07 (×22): qty 1000

## 2012-05-07 MED ORDER — PROMETHAZINE HCL 25 MG PO TABS
12.5000 mg | ORAL_TABLET | Freq: Four times a day (QID) | ORAL | Status: DC | PRN
  Administered 2012-05-07: 12.5 mg via ORAL
  Filled 2012-05-07: qty 1

## 2012-05-07 MED ORDER — KETOROLAC TROMETHAMINE 15 MG/ML IJ SOLN
15.0000 mg | Freq: Four times a day (QID) | INTRAMUSCULAR | Status: AC
  Administered 2012-05-07 – 2012-05-08 (×5): 15 mg via INTRAVENOUS
  Filled 2012-05-07 (×12): qty 1

## 2012-05-07 MED ORDER — MORPHINE SULFATE 2 MG/ML IJ SOLN
1.0000 mg | INTRAMUSCULAR | Status: DC | PRN
  Administered 2012-05-07: 1 mg via INTRAVENOUS
  Administered 2012-05-08 – 2012-05-11 (×18): 2 mg via INTRAVENOUS
  Filled 2012-05-07 (×19): qty 1

## 2012-05-07 MED ORDER — PROMETHAZINE HCL 25 MG/ML IJ SOLN
12.5000 mg | Freq: Four times a day (QID) | INTRAMUSCULAR | Status: DC | PRN
  Filled 2012-05-07 (×2): qty 1

## 2012-05-07 MED FILL — Sodium Chloride Irrigation Soln 0.9%: Qty: 3000 | Status: AC

## 2012-05-07 MED FILL — Sodium Chloride IV Soln 0.9%: INTRAVENOUS | Qty: 1000 | Status: AC

## 2012-05-07 NOTE — Consult Note (Signed)
I have taken a history, examined the patient and reviewed the chart. I agree with the extender's note, impression and recommendations. Presumed post op ileus, R/O partial SBO. Anemia without GI bleeding. Abd films today, NPO, IV antiemetics, NG tube was ordered.  Meryl Dare MD [facility]

## 2012-05-07 NOTE — Progress Notes (Signed)
Occupational Therapy Treatment Patient Details Name: [name] MRN: [id number] DOB: 03/25/1941 Today's Date: 05/07/2012 Time: 1352-1410 OT Time Calculation (min): 18 min  OT Assessment / Plan / Recommendation Comments on Treatment Session Pt limited by fatigue and dizziness.  Pt with ileus, and OT educated pt on need to increase activity tolerance and OOB mobility.  May need to consider ST SNF if pt does not progress.    Follow Up Recommendations  Home health OT;Supervision/Assistance - 24 hour    Barriers to Discharge       Equipment Recommendations   (TBD by OT)    Recommendations for Other Services    Frequency Min 2X/week   Plan Discharge plan remains appropriate    Precautions / Restrictions Precautions Precautions: Back Precaution Comments: educated pt on maintaining back precautions during transfer. Required Braces or Orthoses: Spinal Brace Spinal Brace: Lumbar corset;Applied in sitting position Restrictions Weight Bearing Restrictions: No   Pertinent Vitals/Pain See vitals    ADL  Toilet Transfer: Simulated;+2 Total assistance Toilet Transfer: Patient Percentage: 60% Toilet Transfer Method: Stand pivot Acupuncturist: Other (comment) (chair to EOB) Equipment Used: Back brace;Gait belt;Rolling walker Transfers/Ambulation Related to ADLs: +2 assist for safety due to pt's dizziness and for manuevering RW ADL Comments: Pt performed bathing ADL with nursing staff priot to OT arrival.  Pt declining further ADL participation during this session due to pain and dizziness.  Educated pt on need to increase activity tolerance and independence with ADLs, but pt declining and requesting return to bed.      OT Diagnosis:    OT Problem List:   OT Treatment Interventions:     OT Goals ADL Goals Pt Will Transfer to Toilet: with DME;3-in-1;with modified independence ADL Goal: Toilet Transfer - Progress: Progressing toward goals Additional ADL Goal #1: Pt will  verbalize/generalize 3/3 back precautions in prep for functional activities ADL Goal: Additional Goal #1 - Progress: Progressing toward goals  Visit Information  Last OT Received On: 05/07/12 Assistance Needed: +2    Subjective Data      Prior Functioning       Cognition  Overall Cognitive Status: Appears within functional limits for tasks assessed/performed Arousal/Alertness: Awake/alert Orientation Level: Appears intact for tasks assessed Behavior During Session: [facility] for tasks performed    Mobility Bed Mobility Bed Mobility: Sit to Sidelying Left Rolling Right: 2: Max assist Sit to Sidelying Left: 2: Max assist;HOB flat Details for Bed Mobility Assistance: Max assist to bring bil LE into bed and to position trunk/torso. Transfers Sit to Stand: 1: +2 Total assist;From chair/3-in-1;With upper extremity assist Sit to Stand: Patient Percentage: 60% Stand to Sit: 1: +2 Total assist;To bed;With upper extremity assist Stand to Sit: Patient Percentage: 60% Details for Transfer Assistance: Assist to extend knees/trunk and max VC to pivot toward bed safely with RW.   Exercises    Balance    End of Session OT - End of Session Equipment Utilized During Treatment: Gait belt;Back brace Activity Tolerance: Patient limited by fatigue;Patient limited by pain Patient left: in bed;with call bell/phone within reach Nurse Communication: Mobility status  GO    05/07/2012 Cipriano Mile OTR/L Pager [phone] Office [phone]  Cipriano Mile 05/07/2012, 3:37 PM

## 2012-05-07 NOTE — Progress Notes (Signed)
Patient Name: [name] Date of Encounter: 05/07/2012    SUBJECTIVE:Very uncomfortable and with abdominal pain and nausea. Has foley in due to inability to void.  TELEMETRY:  Sinus tach Filed Vitals:   05/07/12 0200 05/07/12 0402 05/07/12 0606 05/07/12 1000  BP: 128/58  104/62 115/57  Pulse: 98  104 110  Temp: 98.8 F (37.1 C)  98.1 F (36.7 C) 98.6 F (37 C)  TempSrc: Oral  Oral Oral  Resp: 20 20 20 20   Height:      Weight:      SpO2: 94% 94% 92% 93%   No intake or output data in the 24 hours ending 05/07/12 1044  LABS: Basic Metabolic Panel:  Basename 05/07/12 0930 05/06/12 0931  NA 135 131*  K 4.1 4.1  CL 99 96  CO2 29 27  GLUCOSE 226* 262*  BUN 10 12  CREATININE 0.73 0.77  CALCIUM 8.9 8.5  MG -- --  PHOS -- --   CBC:  Basename 05/07/12 0930 05/06/12 0931  WBC 11.8* 12.9*  NEUTROABS -- 10.3*  HGB 10.5* 10.7*  HCT 29.9* 30.7*  MCV 90.3 91.1  PLT 127* 123*   Radiology/Studies:  Study Result     *RADIOLOGY REPORT*  Clinical Data: Some diffuse abdominal pain with nausea vomiting.  In the left  ACUTE ABDOMEN SERIES (ABDOMEN 2 VIEW & CHEST 1 VIEW)  Comparison: CT chest from 08/08/2011. Chest x-ray from 08/08/2011.  Findings: Lung volumes are low with asymmetric elevation of right  hemidiaphragm. No edema or focal airspace consolidation.  Cardiopericardial silhouette is at upper limits of normal for size.  Telemetry leads overlie the chest.  Right-sided decubitus film shows no evidence for intraperitoneal  free air. Supine film shows no gaseous bowel dilatation to suggest  obstruction. No worrisome or suspicious abdominopelvic  calcification. Degenerative changes are seen in the mid lumbar  spine.  IMPRESSION:  No acute cardiopulmonary findings.  No evidence for bowel obstruction or perforation.  Original Report Authenticated By: ERIC A. MANSELL, M.D.    ECHO: Study Conclusions  - Left ventricle: The cavity size was normal. Systolic function  was moderately reduced. The estimated ejection fraction was in the range of 35% to 40%. There is akinesis of the inferior myocardium; consistent with infarction. Doppler parameters are consistent with abnormal left ventricular relaxation (grade 1 diastolic dysfunction). 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 - Left atrium: The atrium was mildly dilated. - Right ventricle: The cavity size was mildly dilated. - Right atrium: The atrium was mildly dilated. 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 rate 20, height 6\' 1"  (1.854 m), weight 96.48 kg (212 lb 11.2 oz), SpO2 93.00%. Weight change:    Abdomen is distended and mod tender.   Chest is clear anteriorly  Cardiac reveals tachycardia.  Extremities reveal trace edema  ASSESSMENT:  1. CV status seems stable. EF has not changed, and ECG shows no acute abnormality. Sinus tach related to pain and possibly other factors.  2. S/p lumbar surgery  3. Presumed recurrent ileus   Plan:  1. Continue beta blocker.  2. BNP  Signed, Lesleigh Noe 05/07/2012, 10:44 AM

## 2012-05-07 NOTE — Progress Notes (Signed)
RN Notified of abnormal BP and pulse.  

## 2012-05-07 NOTE — Progress Notes (Signed)
Seen and agreed 05/07/2012 Fredrich Birks PTA [phone] pager [phone] office

## 2012-05-07 NOTE — Progress Notes (Signed)
Physical Therapy Treatment Patient Details Name: [name] MRN: [id number] DOB: 1941/04/21 Today's Date: 05/07/2012 Time: 4098-1191 PT Time Calculation (min): 26 min  PT Assessment / Plan / Recommendation Comments on Treatment Session  Pt was limited by abdominal pain.  At start of session, pt was seated in chair, with brace on loosely.   Reapplied brace and reeducated pt on proper on/off.Pt unable to tolerate sitting in chair, and requested to be moved back to bed.  Pt required +2 assistance for safety and cues for position and control of RW.   Educated pt and wife of importance of activity.  Requested that pt sit in chair at least one more time today (or more if possible).   Continue with POC.      Follow Up Recommendations  Home health PT    Barriers to Discharge        Equipment Recommendations       Recommendations for Other Services    Frequency Min 5X/week   Plan Discharge plan remains appropriate;Frequency remains appropriate    Precautions / Restrictions Precautions Precautions: Back Required Braces or Orthoses: Spinal Brace Spinal Brace: Lumbar corset;Applied in sitting position Restrictions Weight Bearing Restrictions: No   Pertinent Vitals/Pain     Mobility  Bed Mobility Bed Mobility: Sit to Sidelying Left Rolling Right: 2: Max assist Sit to Sidelying Left: 2: Max assist;HOB flat Details for Bed Mobility Assistance: Max assist to bring bil LE into bed and to position trunk/torso. Transfers Transfers: Arboriculturist Transfers: 1: +2 Total assist;With armrests Stand Pivot Transfers: Patient Percentage: 60% Details for Transfer Assistance: Assist to extend knees/trunk and max VC to pivot toward bed safely with RW.    Exercises     PT Diagnosis:    PT Problem List:   PT Treatment Interventions:     PT Goals Acute Rehab PT Goals PT Goal: Sit to Supine/Side - Progress: Not progressing  Visit Information  Last PT  Received On: 05/07/12 Assistance Needed: +2 PT/OT Co-Evaluation/Treatment: Yes    Subjective Data  Subjective: "I just can't sit up any more.  I am hurting so bad."   Cognition  Overall Cognitive Status: Appears within functional limits for tasks assessed/performed Arousal/Alertness: Awake/alert Orientation Level: Appears intact for tasks assessed Behavior During Session: [facility] for tasks performed    Balance     End of Session PT - End of Session Equipment Utilized During Treatment: Gait belt;Back brace Activity Tolerance: Patient limited by pain Patient left: in bed;with call bell/phone within reach Nurse Communication: Mobility status   GP     Kierston Plasencia, SPTA 05/07/2012, 2:47 PM

## 2012-05-07 NOTE — Consult Note (Signed)
[facility] Consultation  Referring Provider: Barnett Abu, MD Primary Care Physician:  Lillia Mountain, MD Primary Gastroenterologist:   None Reason for Consultation:  Ileus  HPI: [name] is a 71 y.o. male with an a history of several back surgeries. He also has a history of abdominal aortic aneurysm repair. Patient underwent a laminectomy 2 days ago by Dr. Danielle Dess. Yesterday patient felt poorly with nausea, vomiting, tachycardia/PVCs and hypotension. His BP responded to IVF. Cardiology was asked to evaluate. Echocardiogram was unchanged from previous studies and ECG negative for acute abnormalities. Today patient has continued to complain of nausea but no further vomiting. His abdomen is distended, he's had a flatus since surgery. Patient describes ileus with a prior surgery.  Patient denies chronic GI problems. He has occasional GERD for which he takes a daily proton pump inhibitor.  Past Medical History  Diagnosis Date  . Hypertension   . Hypercholesteremia   . AAA (abdominal aortic aneurysm)   . Pain     back pain chronic- seen at pain clinic  . Acid reflux   . Neuropathy     in legs  . MI (myocardial infarction)     1992 and 1997 sees Dr. Mendel Ryder as needed   . Bronchitis     hx of  . Sleep apnea     uses cpap   . Diabetes mellitus     oral medication  . Complication of anesthesia     -years ago hair fell out.  [name] Dysrhythmia     "got out of rhythym a couple , but it straightened back out."  . Ileus   . Arthritis     BacK  . Incisional hernia     "small, from AAA"  . Coronary atherosclerosis of native coronary artery     ejection fraction of 40%, prior inferior myocardial infarction in February 1993, PTCA, LAD rotational atherectomy in August of 1998, acute inferolateral myocardial infarction, ventricular fibrillation in July of 1999, PTCA and stent of obtuse marginal 1, 100% RCA 1/12 with collaterals from patent circumflex and LAD    Past Surgical  History  Procedure Date  . Abdominal surgery   . Abdominal aortic aneurysm repair   . Colonoscopy   . Cardiac catheterization   . Cholecystectomy   . Back surgery 2013    Miminal Invasive x2 in [place].    Prior to Admission medications   Medication Sig Start Date End Date Taking? Authorizing Provider  aspirin EC 81 MG tablet Take 81 mg by mouth daily.   Yes Historical Provider, MD  atorvastatin (LIPITOR) 10 MG tablet Take 10 mg by mouth daily.   Yes Historical Provider, MD  Buprenorphine (BUTRANS) 10 MCG/HR PTWK Place 1 patch onto the skin every 7 (seven) days. Thursdays   Yes Historical Provider, MD  Coenzyme Q10 (COQ-10) 100 MG CAPS Take 3 capsules by mouth daily.    Yes Historical Provider, MD  finasteride (PROPECIA) 1 MG tablet Take by mouth daily.   Yes Historical Provider, MD  metFORMIN (GLUCOPHAGE-XR) 500 MG 24 hr tablet Take 500 mg by mouth 2 (two) times daily with breakfast and lunch.   Yes Historical Provider, MD  metoprolol tartrate (LOPRESSOR) 25 MG tablet Take 25 mg by mouth 2 (two) times daily.   Yes Historical Provider, MD  Multiple Vitamin (MULTIVITAMIN WITH MINERALS) TABS Take 1 tablet by mouth daily.   Yes Historical Provider, MD  pantoprazole (PROTONIX) 40 MG tablet Take 40 mg by mouth daily.  Yes Historical Provider, MD  ramipril (ALTACE) 10 MG capsule Take 10 mg by mouth daily.   Yes Historical Provider, MD  traMADol (ULTRAM) 50 MG tablet Take 50 mg by mouth every 6 (six) hours as needed. For pain   Yes Historical Provider, MD  vitamin B-12 (CYANOCOBALAMIN) 1000 MCG tablet Take 1,000 mcg by mouth every other day.   Yes Historical Provider, MD    Current Facility-Administered Medications  Medication Dose Route Frequency Provider Last Rate Last Dose  . 0.9 %  sodium chloride infusion  250 mL Intravenous Continuous Barnett Abu, MD 1 mL/hr at 05/07/12 0307 250 mL at 05/07/12 0307  . acetaminophen (TYLENOL) tablet 650 mg  650 mg Oral Q4H PRN Barnett Abu, MD   650 mg  at 05/06/12 1846   Or  . acetaminophen (TYLENOL) suppository 650 mg  650 mg Rectal Q4H PRN Barnett Abu, MD      . alum & mag hydroxide-simeth (MAALOX/MYLANTA) 200-200-20 MG/5ML suspension 30 mL  30 mL Oral Q6H PRN Barnett Abu, MD      . atorvastatin (LIPITOR) tablet 10 mg  10 mg Oral Daily Barnett Abu, MD   10 mg at 05/06/12 1741  . dextrose 5 % and 0.9 % NaCl with KCl 20 mEq/L infusion   Intravenous Continuous Barnett Abu, MD 100 mL/hr at 05/07/12 1138    . diazepam (VALIUM) tablet 5 mg  5 mg Oral Q6H PRN Barnett Abu, MD   5 mg at 05/07/12 0844  . diphenhydrAMINE (BENADRYL) injection 12.5 mg  12.5 mg Intravenous Q6H PRN Barnett Abu, MD       Or  . diphenhydrAMINE (BENADRYL) 12.5 MG/5ML elixir 12.5 mg  12.5 mg Oral Q6H PRN Barnett Abu, MD      . ketorolac (TORADOL) 15 MG/ML injection 15 mg  15 mg Intravenous Q8H Barnett Abu, MD   15 mg at 05/06/12 2252  . ketorolac (TORADOL) 15 MG/ML injection 15 mg  15 mg Intravenous Q6H Barnett Abu, MD   15 mg at 05/07/12 1329  . ketorolac (TORADOL) 30 MG/ML injection           . menthol-cetylpyridinium (CEPACOL) lozenge 3 mg  1 lozenge Oral PRN Barnett Abu, MD   3 mg at 05/05/12 2354   Or  . phenol (CHLORASEPTIC) mouth spray 1 spray  1 spray Mouth/Throat PRN Barnett Abu, MD      . metFORMIN (GLUCOPHAGE-XR) 24 hr tablet 500 mg  500 mg Oral BID WC Barnett Abu, MD   500 mg at 05/07/12 1138  . metoprolol tartrate (LOPRESSOR) tablet 25 mg  25 mg Oral BID Barnett Abu, MD   25 mg at 05/07/12 1013  . ondansetron (ZOFRAN) injection 4 mg  4 mg Intravenous Q4H PRN Barnett Abu, MD   4 mg at 05/07/12 1308  . pantoprazole (PROTONIX) EC tablet 40 mg  40 mg Oral Daily Barnett Abu, MD   40 mg at 05/07/12 1139  . promethazine (PHENERGAN) tablet 12.5 mg  12.5 mg Oral Q6H PRN Tia Alert, MD   12.5 mg at 05/07/12 1013   Or  . promethazine (PHENERGAN) injection 12.5 mg  12.5 mg Intravenous Q6H PRN Tia Alert, MD      . ramipril (ALTACE) capsule 10 mg  10 mg  Oral Daily Barnett Abu, MD   10 mg at 05/07/12 1013  . sodium chloride 0.9 % bolus 500 mL  500 mL Intravenous Once Barnett Abu, MD   500 mL at 05/06/12 1600  .  sodium chloride 0.9 % injection 3 mL  3 mL Intravenous Q12H Barnett Abu, MD   3 mL at 05/07/12 1018  . sodium chloride 0.9 % injection 3 mL  3 mL Intravenous PRN Barnett Abu, MD      . sodium chloride 0.9 % injection 9 mL  9 mL Intravenous PRN Barnett Abu, MD      . sorbitol 70 % solution 30 mL  30 mL Oral Daily PRN Barnett Abu, MD      . Tamsulosin HCl (FLOMAX) capsule 0.4 mg  0.4 mg Oral Daily Barnett Abu, MD   0.4 mg at 05/07/12 1013  . traMADol (ULTRAM) tablet 50 mg  50 mg Oral Q6H PRN Barnett Abu, MD   50 mg at 05/07/12 0844  . DISCONTD: morphine 1 MG/ML PCA injection   Intravenous Q4H Barnett Abu, MD   10 mg at 05/07/12 0800  . DISCONTD: naloxone (NARCAN) injection 0.4 mg  0.4 mg Intravenous PRN Barnett Abu, MD        Allergies as of 03/09/2012  . (Not on File)    Family History  Problem Relation Age of Onset  . Diabetes Mother   . Coronary artery disease Mother   . Diabetes Father   . Arthritis Father   . Rheum arthritis Father   . Coronary artery disease Father     History   Social History  . Marital Status: Married    Spouse Name: N/A    Number of Children: N/A  . Years of Education: N/A   Occupational History  . Not on file.   Social History Main Topics  . Smoking status: Former Smoker    Quit date: 01/01/1992  . Smokeless tobacco: Never Used  . Alcohol Use: Yes     occasional  . Drug Use: No  . Sexually Active:     Review of Systems: All other systems reviewed and negative except where noted in the history of present illness.  PHYSICAL EXAM: Vital signs in last 24 hours: Temp:  [98.1 F (36.7 C)-101.4 F (38.6 C)] 98.6 F (37 C) (07/11 1000) Pulse Rate:  [98-110] 110  (07/11 1000) Resp:  [12-20] 20  (07/11 1000) BP: (92-128)/(44-62) 115/57 mmHg (07/11 1000) SpO2:  [92 %-97 %] 93 %  (07/11 1000) Last BM Date: 05/05/12 (pts) General:   Pleasant white male male in NAD Head:  Normocephalic and atraumatic. Eyes:   No icterus.   Conjunctiva pink. Ears:  Normal auditory acuity. Neck:  Supple; no masses felt Lungs:  Respirations even and unlabored. A few bibasilar crackles heard.  Heart:  Regular rate and rhythm Abdomen:  Soft, moderately distended with tympany there are a few bowel sounds in the lower quadrants. .  Rectal:  Not performed.  Msk:  Symmetrical without gross deformities.  Extremities:  Without edema. Neurologic:  Alert and  oriented x4;  grossly normal neurologically. Skin:  Intact without significant lesions or rashes. Cervical Nodes:  No significant cervical adenopathy. Psych:  Alert and cooperative. Normal affect.  LAB RESULTS:  Basename 05/07/12 0930 05/06/12 0931 05/05/12 1209  WBC 11.8* 12.9* --  HGB 10.5* 10.7* 11.2*  HCT 29.9* 30.7* 33.0*  PLT 127* 123* --   BMET  Basename 05/07/12 0930 05/06/12 0931 05/05/12 1209  NA 135 131* 138  K 4.1 4.1 4.6  CL 99 96 --  CO2 29 27 --  GLUCOSE 226* 262* 216*  BUN 10 12 --  CREATININE 0.73 0.77 --  CALCIUM 8.9 8.5 --  PREVIOUS ENDOSCOPIES: colonoscopy [facility]). I do not have report at present but negative exam per patient.   IMPRESSION / PLAN:  [age] year-[age] male s/p laminectomy of L3 and L4 by Dr. Danielle Dess on 05/05/12. Postoperatively patient has developed nausea, vomiting, and abdominal distention. Suspect patient has postoperative ileus. Obviously minimizing narcotics will help, Morphine was discontinued today, hopefully pain can be managed with Ultram and Toradol. Monitor electrolytes. Patient in too much pain to ambulate but has been up in the chair. Mobilizing the patient should help. I encouraged the him to lay on his sides when in bed rather than flat on his back. An NGT has already been ordered and this should help with decompression as well. If no improvement by tomorrow will obtain abdominal  films.   2. Anemia, declining hemoglobin. His preop hemoglobin was 14.5, it fell to 10.7 yesterday but stable today at 10.5. No overt GI bleeding. He got IVF so anemia may be dilutional  3. Multiple medical problems as listed in PMH.   Thanks   LOS: 2 days   Willette Cluster  05/07/2012, 2:18 PM

## 2012-05-07 NOTE — Progress Notes (Signed)
Pt experienced nausea and vomiting throughout the night. Zofran 4mg  IV given twice with not lasting relief.  Doctor called this am. Received new order for Phenergan 12.5 mg IV.

## 2012-05-07 NOTE — Progress Notes (Signed)
RN Notified of abnormal BP and pulse.

## 2012-05-07 NOTE — Progress Notes (Signed)
Patient ID: [name], male   DOB: [birth year], 71 y.o.   MRN: [id number] Patient feeling poorly with some nausea and vomiting persisting. Labs yesterday showed hemoglobin of 30, electrolytes stable. Patient has not been able to be mobile complains of diffuse abdominal discomfort.   on examination abdomen distended diffusely tender tympanic bowel sounds. Lower extremity strength appears intact  Impression patient appears to have ileus. Will make n.p.o. place NG tube for last gastroenterology to evaluate for any other changes that need to be undertaken. Recheck CBC in be met today.

## 2012-05-07 NOTE — Progress Notes (Signed)
Inpatient Diabetes Program Recommendations  AACE/ADA: New Consensus Statement on Inpatient Glycemic Control (2009)  Target Ranges:  Prepandial:   less than 140 mg/dL      Peak postprandial:   less than 180 mg/dL (1-2 hours)      Critically ill patients:  140 - 180 mg/dL   Reason for Visit: Hyperglycemia  Inpatient Diabetes Program Recommendations Insulin - Basal: May need basal as well, Lantus 10 units daily Correction (SSI): glucose this am at 226 mg/dL. Again,  please check cbg's and treat  with correction insulin to start.  Note: Thank you, Lenor Coffin, RN, CNS, Diabetes Coordinator [phone])

## 2012-05-08 LAB — BASIC METABOLIC PANEL
BUN: 13 mg/dL (ref 6–23)
CO2: 27 mEq/L (ref 19–32)
Calcium: 8.4 mg/dL (ref 8.4–10.5)
Chloride: 102 mEq/L (ref 96–112)
Creatinine, Ser: 0.7 mg/dL (ref 0.50–1.35)
GFR calc Af Amer: >90 mL/min (ref >90)
GFR calc non Af Amer: >90 mL/min (ref >90)
Glucose, Bld: 241 mg/dL — ABNORMAL HIGH (ref 70–99)
Potassium: 3.8 mEq/L (ref 3.5–5.1)
Sodium: 137 mEq/L (ref 135–145)

## 2012-05-08 NOTE — Progress Notes (Signed)
Occupational Therapy Treatment Patient Details Name: [name] MRN: [id number] DOB: 10/13/1941 Today's Date: 05/08/2012 Time: 4098-1191 OT Time Calculation (min): 28 min  OT Assessment / Plan / Recommendation Comments on Treatment Session Pt continues to be limited by pain and fatigue.  Session limited today also due to RN unable to clamp NG tube from wall attachement in order for therapy to perform further mobility.   Updating d/c plan to SNF vs HHOT pending pt's progress.  Pt will need to make significant progress to d/c home.    Follow Up Recommendations  Skilled nursing facility;Home health OT (pending progress)    Barriers to Discharge       Equipment Recommendations   (TBD)    Recommendations for Other Services    Frequency Min 2X/week   Plan Discharge plan needs to be updated    Precautions / Restrictions Precautions Precautions: Back Precaution Comments: Educated pt on back precautions during bed mobility (no twisting) Required Braces or Orthoses: Spinal Brace Spinal Brace: Lumbar corset;Applied in sitting position Restrictions Weight Bearing Restrictions: No   Pertinent Vitals/Pain See vitals    ADL  Toilet Transfer: Simulated;+2 Total assistance Toilet Transfer: Patient Percentage: 40% Toilet Transfer Method: Sit to Barista: Other (comment) (EOB) Equipment Used: Back brace;Gait belt Transfers/Ambulation Related to ADLs: +2 assist to complete sit<>stand ADL Comments: Pt unable to participate in ADLs due to pain.  Max encouragement provided to perform basic transfer EOB.  Donned/doffed back brace with total assist.      OT Diagnosis:    OT Problem List:   OT Treatment Interventions:     OT Goals ADL Goals Pt Will Transfer to Toilet: with DME;3-in-1;with modified independence ADL Goal: Toilet Transfer - Progress: Progressing toward goals Additional ADL Goal #1: Pt will verbalize/generalize 3/3 back precautions in prep for functional  activities ADL Goal: Additional Goal #1 - Progress: Progressing toward goals  Visit Information  Last OT Received On: 05/08/12 Assistance Needed: +2    Subjective Data      Prior Functioning       Cognition  Overall Cognitive Status: Appears within functional limits for tasks assessed/performed Arousal/Alertness: Lethargic Orientation Level: Appears intact for tasks assessed Behavior During Session: [facility] for tasks performed    Mobility Bed Mobility Bed Mobility: Right Sidelying to Sit Rolling Right: 1: +2 Total assist Rolling Right: Patient Percentage: 60% Rolling Left: Patient Percentage: 60% Right Sidelying to Sit: 1: +2 Total assist Right Sidelying to Sit: Patient Percentage: 30% Sit to Supine: 1: +2 Total assist Sit to Supine: Patient Percentage: 50% Details for Bed Mobility Assistance: Pt required +2 assist for NG tubing and pt's overall weakness and pain.    Transfers Sit to Stand: 1: +2 Total assist;From bed;With upper extremity assist;From elevated surface Sit to Stand: Patient Percentage: 40% Stand to Sit: To bed;To elevated surface;With upper extremity assist Stand to Sit: Patient Percentage: 60% Details for Transfer Assistance: Pt required +2 assist for NG tubes and to maintain upright posture when standing. Pt required VC to open eyes.     Exercises    Balance Balance Balance Assessed: Yes Static Sitting Balance Static Sitting - Balance Support: Bilateral upper extremity supported;Feet supported Static Sitting - Level of Assistance: 1: +2 Total assist Static Sitting - Comment/# of Minutes: EOB ~ 5 min;  +2 total assist for management of lines and safety.    End of Session OT - End of Session Equipment Utilized During Treatment: Gait belt;Back brace Activity Tolerance: Patient limited  by fatigue;Patient limited by pain;Other (comment) (RN unable to unclamp NG tube ) Patient left: in bed;with call bell/phone within reach;with family/visitor present;with bed alarm  set Nurse Communication: Mobility status;Patient requests pain meds  GO   05/08/2012 Cipriano Mile OTR/L Pager [phone] Office [phone]   Cipriano Mile 05/08/2012, 4:36 PM

## 2012-05-08 NOTE — Progress Notes (Signed)
Seen and agreed 05/08/2012 Fredrich Birks PTA [phone] pager [phone] office

## 2012-05-08 NOTE — Progress Notes (Signed)
Patient ID: [name], male   DOB: 1941-06-14, 71 y.o.   MRN: [id number] Vital signs appear stable heart rate is normal. Patient has been afebrile. His incision is clean and dry. His abdomen is still diffusely tender complains of diffuse feeling of nausea and generalized malaise with back pain. His legs are not bothering him.  His incision is clean and dry, his motor function appears intact in lower extremities. Bowel sounds are still quite minimal. NG tube is showing evidence of bilious drainage.  Ileus continues with slow resolution. I'm hopeful this will resolve with the passage of time. At this time we'll continue IV fluid support n.p.o. We will recheck be met and CBC in the a.m.

## 2012-05-08 NOTE — Progress Notes (Signed)
I have taken an interval history, reviewed the chart and examined the patient. I agree with the extender's note, impression and recommendations. Increased mobility and decreased narcotic pain medication would help. Continue NG suction and monitor abdonimal exam. Consider repeat abd film pending his course.  Venita Lick. Russella Dar MD Clementeen Graham

## 2012-05-08 NOTE — Progress Notes (Signed)
Bladder scanned pt. Because wife worried about amount of urination today. Residual was only 84cc.

## 2012-05-08 NOTE — Progress Notes (Signed)
Physical Therapy Treatment Patient Details Name: [name] MRN: [id number] DOB: [birth year] Today's Date: 05/08/2012 Time: 2952-8413 PT Time Calculation (min): 27 min  PT Assessment / Plan / Recommendation Comments on Treatment Session  Pt was limited in mobility due to NG tubing (could not clamp off during session per nursing).  Pt was able to tolerate ~ 5 min seated EOB, and less than a minute in standing.  Will continue to progress mobilitity as tolerated by patient.  May need to update discharge plan based on pt's mobility.     Follow Up Recommendations  Home health PT    Barriers to Discharge        Equipment Recommendations       Recommendations for Other Services    Frequency Min 5X/week   Plan Discharge plan remains appropriate;Frequency remains appropriate    Precautions / Restrictions Precautions Precautions: Back Precaution Comments: Educated pt on back precautions during bed mobility (no twisting) Required Braces or Orthoses: Spinal Brace Spinal Brace: Lumbar corset;Applied in sitting position Restrictions Weight Bearing Restrictions: No   Pertinent Vitals/Pain     Mobility  Bed Mobility Bed Mobility: Right Sidelying to Sit Rolling Right: 1: +2 Total assist Rolling Right: Patient Percentage: 60% Rolling Left: Patient Percentage: 60% Right Sidelying to Sit: 1: +2 Total assist Right Sidelying to Sit: Patient Percentage: 30% Sit to Supine: 1: +2 Total assist Sit to Supine: Patient Percentage: 50% Details for Bed Mobility Assistance: Pt required +2 assist for NG tubing and pt's overall weakness and pain.    Transfers Sit to Stand: 1: +2 Total assist;From bed;With upper extremity assist;From elevated surface Sit to Stand: Patient Percentage: 40% Stand to Sit: To bed;To elevated surface;With upper extremity assist Stand to Sit: Patient Percentage: 60% Details for Transfer Assistance: Pt required +2 assist for NG tubes and to maintain upright posture when  standing. Pt required VC to open eyes.      Exercises     PT Diagnosis:    PT Problem List:   PT Treatment Interventions:     PT Goals Acute Rehab PT Goals PT Goal: Rolling Supine to Right Side - Progress: Not progressing PT Goal: Rolling Supine to Left Side - Progress: Not progressing PT Goal: Supine/Side to Sit - Progress: Not progressing PT Goal: Sit to Supine/Side - Progress: Not progressing PT Goal: Sit to Stand - Progress: Not progressing PT Goal: Stand to Sit - Progress: Not progressing  Visit Information  Last PT Received On: 05/08/12 Assistance Needed: +2 PT/OT Co-Evaluation/Treatment: Yes    Subjective Data  Subjective: "I am feeling terrible."   Cognition  Overall Cognitive Status: Appears within functional limits for tasks assessed/performed Arousal/Alertness: Lethargic Orientation Level: Appears intact for tasks assessed Behavior During Session: [facility] for tasks performed    Balance  Balance Balance Assessed: Yes Static Sitting Balance Static Sitting - Balance Support: Bilateral upper extremity supported;Feet supported Static Sitting - Level of Assistance: 1: +2 Total assist Static Sitting - Comment/# of Minutes: EOB ~ 5 min;  +2 total assist for management of lines and safety.    End of Session PT - End of Session Equipment Utilized During Treatment: Gait belt;Back brace Activity Tolerance: Patient limited by pain;Patient limited by fatigue Patient left: in bed;with family/visitor present;with call bell/phone within reach Nurse Communication: Mobility status   GP     [name], SPTA 05/08/2012, 1:57 PM

## 2012-05-08 NOTE — Progress Notes (Signed)
Patient Name: [name] Date of Encounter: 05/08/2012    SUBJECTIVE: Back hurts and abdomen is sore. No nausea. Feels miserable. No dyspnea or chest pain.  TELEMETRY:  NSR with slower heart rate.: Filed Vitals:   05/07/12 2138 05/08/12 0231 05/08/12 0645 05/08/12 1000  BP: 108/54 101/45 131/65 126/81  Pulse: 105 103 97 99  Temp: 98.9 F (37.2 C) 99.6 F (37.6 C) 98.2 F (36.8 C) 98.9 F (37.2 C)  TempSrc: Oral Oral Oral Oral  Resp: 20 18 16 18   Height:      Weight:      SpO2: 93% 93% 95% 97%    Intake/Output Summary (Last 24 hours) at 05/08/12 1043 Last data filed at 05/08/12 0752  Gross per 24 hour  Intake 1166.67 ml  Output   4150 ml  Net -2983.33 ml    LABS: Basic Metabolic Panel:  Basename 05/07/12 0930 05/06/12 0931  NA 135 131*  K 4.1 4.1  CL 99 96  CO2 29 27  GLUCOSE 226* 262*  BUN 10 12  CREATININE 0.73 0.77  CALCIUM 8.9 8.5  MG -- --  PHOS -- --   CBC:  Basename 05/07/12 0930 05/06/12 0931  WBC 11.8* 12.9*  NEUTROABS -- 10.3*  HGB 10.5* 10.7*  HCT 29.9* 30.7*  MCV 90.3 91.1  PLT 127* 123*   BNP    Component Value Date/Time   PROBNP 636.7* 05/07/2012 0930    Radiology/Studies:  No new.  Physical Exam: Blood pressure 126/81, pulse 99, temperature 98.9 F (37.2 C), temperature source Oral, resp. rate 18, height 6\' 1"  (1.854 m), weight 96.48 kg (212 lb 11.2 oz), SpO2 97.00%. Weight change:    S4 gallop. No edema   ASSESSMENT:  1. Combined systolic and diastolic heart failure, stable and asymptomatic  2. Heart rate improved.  3. Ileus appears to be improving.  Plan:  1. Watch I/O. 2. If becomes dyspneic, would diurese. Observe for now.  Selinda Eon 05/08/2012, 10:43 AM

## 2012-05-08 NOTE — Progress Notes (Signed)
[facility] Progress Note  SUBJECTIVE: doesn't really feel well, tired. No significant abdominal pain. He denies nausea, passing some flatus  OBJECTIVE:  Vital signs in last 24 hours: Temp:  [98.2 F (36.8 C)-99.6 F (37.6 C)] 98.2 F (36.8 C) (07/12 0645) Pulse Rate:  [97-105] 97  (07/12 0645) Resp:  [16-20] 16  (07/12 0645) BP: (99-131)/(45-65) 131/65 mmHg (07/12 0645) SpO2:  [93 %-95 %] 95 % (07/12 0645) Last BM Date: 05/05/12 General:    Pleasant white male in NAD Heart:  Regular rate and rhythm Lungs: Respirations even and unlabored Abdomen:  Soft, minimal diffuse tenderness, moderately distended but less than yesterday. Hypoactive bowel sounds Neurologic:  Alert and oriented,  grossly normal neurologically. Psych:  Cooperative. Normal mood and affect.  Intake/Output from previous day: 07/11 0701 - 07/12 0700 In: 1166.7 [I.V.:1166.7] Out: 2950 [Urine:2950] Intake/Output this shift: Total I/O In: -  Out: 1200 [Emesis/NG output:1200]  Lab Results:  Basename 05/07/12 0930 05/06/12 0931 05/05/12 1209  WBC 11.8* 12.9* --  HGB 10.5* 10.7* 11.2*  HCT 29.9* 30.7* 33.0*  PLT 127* 123* --   BMET  Basename 05/07/12 0930 05/06/12 0931 05/05/12 1209  NA 135 131* 138  K 4.1 4.1 4.6  CL 99 96 --  CO2 29 27 --  GLUCOSE 226* 262* 216*  BUN 10 12 --  CREATININE 0.73 0.77 --  CALCIUM 8.9 8.5 --    Studies/Results: Dg Abd Acute W/chest  05/07/2012  *RADIOLOGY REPORT*  Clinical Data: 71 year old male with abdominal distention pain nausea and vomiting.  ACUTE ABDOMEN SERIES (ABDOMEN 2 VIEW & CHEST 1 VIEW)  Comparison: 01/01/2012.  Findings: Slightly lower lung volumes.  Stable cardiac size and mediastinal contours.  Visualized tracheal air column is within normal limits.  No pneumothorax or pneumoperitoneum.  No significant change in mild retrocardiac streaky opacity.  New prominent gas filled small bowel in the left lower quadrant, up to 48 mm diameter.  Less  dilated mid abdominal gas filled small bowel.  Distal small bowel appears decompressed.  There is gas in the colon.  Postoperative changes to the spine are new since March. Air-fluid levels in the small bowel on the lateral decubitus view.  IMPRESSION: 1.  No free air.  Bowel gas pattern could reflect either early or partial small bowel obstruction versus small bowel ileus. 2.  Mild retrocardiac atelectasis.  Original Report Authenticated By: Harley Hallmark, M.D.     ASSESSMENT / PLAN:   Abdominal distention, 2 view of abdomen yesterday reveals either ileus or early partial  SBO. Suspect post-op ileus. Patient had 1200 ml NGT output from time of placement until  7:30am. In last 4 hours he has had about output. Occupational therapy sat him up on edge of bed this am. Encouraged continued mobility. His abdominal exam is less impressive this am, he is having some flatus. Continue to monitor. Admitting team ordered basic labs for tomorrow.      LOS: 3 days   Willette Cluster  05/08/2012, 10:16 AM

## 2012-05-08 NOTE — Progress Notes (Signed)
1200 cc emptied from ng tube this am at 0730. Pt under no s/s distess. Currently stable.

## 2012-05-09 LAB — CBC
HCT: 27.2 % — ABNORMAL LOW (ref 39.0–52.0)
Hemoglobin: 9.4 g/dL — ABNORMAL LOW (ref 13.0–17.0)
MCH: 31.2 pg (ref 26.0–34.0)
MCHC: 34.6 g/dL (ref 30.0–36.0)
MCV: 90.4 fL (ref 78.0–100.0)
Platelets: 143 10*3/uL — ABNORMAL LOW (ref 150–400)
RBC: 3.01 MIL/uL — ABNORMAL LOW (ref 4.22–5.81)
RDW: 12.4 % (ref 11.5–15.5)
WBC: 5.5 10*3/uL (ref 4.0–10.5)

## 2012-05-09 LAB — BASIC METABOLIC PANEL
BUN: 10 mg/dL (ref 6–23)
CO2: 26 mEq/L (ref 19–32)
Calcium: 8.6 mg/dL (ref 8.4–10.5)
Chloride: 108 mEq/L (ref 96–112)
Creatinine, Ser: 0.7 mg/dL (ref 0.50–1.35)
GFR calc Af Amer: >90 mL/min (ref >90)
GFR calc non Af Amer: >90 mL/min (ref >90)
Glucose, Bld: 206 mg/dL — ABNORMAL HIGH (ref 70–99)
Potassium: 4.9 mEq/L (ref 3.5–5.1)
Sodium: 139 mEq/L (ref 135–145)

## 2012-05-09 MED ORDER — DEXTROSE 5 % IV SOLN
500.0000 mg | Freq: Four times a day (QID) | INTRAVENOUS | Status: DC | PRN
  Administered 2012-05-09 – 2012-05-10 (×3): 500 mg via INTRAVENOUS
  Filled 2012-05-09 (×7): qty 5

## 2012-05-09 MED ORDER — GLYCERIN (LAXATIVE) 2.1 G RE SUPP
1.0000 | Freq: Once | RECTAL | Status: AC
  Administered 2012-05-09: 13:00:00 via RECTAL
  Filled 2012-05-09: qty 1

## 2012-05-09 MED ORDER — METOCLOPRAMIDE HCL 5 MG/ML IJ SOLN
5.0000 mg | Freq: Two times a day (BID) | INTRAMUSCULAR | Status: DC
  Administered 2012-05-09 – 2012-05-12 (×7): 5 mg via INTRAVENOUS
  Filled 2012-05-09 (×9): qty 1

## 2012-05-09 MED ORDER — WHITE PETROLATUM GEL
Status: AC
  Administered 2012-05-09: 13:00:00
  Filled 2012-05-09: qty 5

## 2012-05-09 NOTE — Progress Notes (Signed)
[facility] Progress Note  SUBJECTIVE: Complains of back pain, fatigue. Abdomen uncomfortable but not in significant pain. No nausea.   OBJECTIVE:  Vital signs in last 24 hours: Temp:  [97.9 F (36.6 C)-99.1 F (37.3 C)] 99.1 F (37.3 C) (07/12 2237) Pulse Rate:  [95-103] 103  (07/12 2237) Resp:  [18-20] 20  (07/12 2237) BP: (126-147)/(64-81) 147/64 mmHg (07/12 2237) SpO2:  [94 %-97 %] 94 % (07/12 2237) Last BM Date: 05/05/12 General:    white male in NAD Abdomen:  Soft,  mild-moderately distended with tympany. After pinching off NGT suction I do hear some bowel sounds. Mild diffuse tenderness Neurologic:  Alert and oriented,  grossly normal neurologically. Psych:  Cooperative. Normal mood and affect.  ILab Results:  Basename 05/09/12 0530 05/07/12 0930  WBC 5.5 11.8*  HGB 9.4* 10.5*  HCT 27.2* 29.9*  PLT 143* 127*   BMET  Basename 05/09/12 0530 05/08/12 1114 05/07/12 0930  NA 139 137 135  K 4.9 3.8 4.1  CL 108 102 99  CO2 26 27 29   GLUCOSE 206* 241* 226*  BUN 10 13 10   CREATININE 0.70 0.70 0.73  CALCIUM 8.6 8.4 8.9   Studies/Results: Dg Abd Acute W/chest  05/07/2012  *RADIOLOGY REPORT*  Clinical Data: 71 year old male with abdominal distention pain nausea and vomiting.  ACUTE ABDOMEN SERIES (ABDOMEN 2 VIEW & CHEST 1 VIEW)  Comparison: 01/01/2012.  Findings: Slightly lower lung volumes.  Stable cardiac size and mediastinal contours.  Visualized tracheal air column is within normal limits.  No pneumothorax or pneumoperitoneum.  No significant change in mild retrocardiac streaky opacity.  New prominent gas filled small bowel in the left lower quadrant, up to 48 mm diameter.  Less dilated mid abdominal gas filled small bowel.  Distal small bowel appears decompressed.  There is gas in the colon.  Postoperative changes to the spine are new since March. Air-fluid levels in the small bowel on the lateral decubitus view.  IMPRESSION: 1.  No free air.  Bowel gas pattern  could reflect either early or partial small bowel obstruction versus small bowel ileus. 2.  Mild retrocardiac atelectasis.  Original Report Authenticated By: Harley Hallmark, M.D.     ASSESSMENT / PLAN:  1. Abdominal distention, 2 view of abdomen 2 days ago reveals either ileus or early partial SBO. Suspect post-op ileus. After speaking with wife and nurse it appears patient only had 400-500 ml NGT output in last 24 hours. He is having some flatus. Will try low dose Reglan and clamp tube for a few hours to see how he does. Electrolytes normal today.   2. Constipation, will give him a suppository.  3. Diabetes, blood glucose in 200's which can cause some delayed gastric emptying and nausea. He is getting Dextrose in IVF (NPO status). Will ask nurse to contact admitting team regarding need for sliding scale insulin.     LOS: 4 days   Willette Cluster  05/09/2012, 9:48 AM

## 2012-05-09 NOTE — Progress Notes (Signed)
I have taken an interval history, reviewed the chart and examined the patient. I agree with the extender's note, impression and recommendations. Trial of clamping NGT and Reglan today for post op ileus. If tolerated could try clear liquids tomorrow.  [name]. Russella Dar MD [facility]

## 2012-05-09 NOTE — Progress Notes (Signed)
RN notified of abnormal BP

## 2012-05-09 NOTE — Progress Notes (Signed)
Physical Therapy Treatment Patient Details Name: ELIBERTO SOLE MRN: [id number] DOB: 01/08/1941 Today's Date: 05/09/2012 Time: 6644-0347 PT Time Calculation (min): 22 min  PT Assessment / Plan / Recommendation Comments on Treatment Session  Pt able to initiate ambulation today.  Did fairly well despite pt reporting significant pain & weakness.   Educated pt on importance of OOB activity.      Follow Up Recommendations  Home health PT    Barriers to Discharge        Equipment Recommendations  None recommended by PT    Recommendations for Other Services    Frequency Min 5X/week   Plan Discharge plan remains appropriate;Frequency remains appropriate    Precautions / Restrictions Precautions Precautions: Back Precaution Comments: reviewed all 3 back precautions Required Braces or Orthoses: Spinal Brace Spinal Brace: Lumbar corset;Applied in sitting position Restrictions Weight Bearing Restrictions: No     Pertinent Vitals/Pain 8/10 back & abdomen.      Mobility  Bed Mobility Bed Mobility: Rolling Left;Left Sidelying to Sit;Sitting - Scoot to Edge of Bed Rolling Left: 4: Min assist;With rail Left Sidelying to Sit: 4: Min assist;HOB flat;With rails Sitting - Scoot to Edge of Bed: 4: Min guard Details for Bed Mobility Assistance: Cues for technique & reinforcement of back precautions.  (A) for LE's & to lift shoulders/trunk to sitting upright.  Pt c/o weakness & pain.  Transfers Transfers: Sit to Stand;Stand to Sit Sit to Stand: 1: +2 Total assist;With upper extremity assist;From bed Sit to Stand: Patient Percentage: 60% Stand to Sit: 3: Mod assist;With upper extremity assist;To chair/3-in-1;With armrests Details for Transfer Assistance: Cues for safest technique.  Assist to achieve standing, balance, & controlled descent.   Ambulation/Gait Ambulation/Gait Assistance: 4: Min assist Ambulation Distance (Feet): 15 Feet Assistive device: Rolling walker Ambulation/Gait Assistance  Details: Max encouragement.  Cues for RW management, tall posture, & safety.  Limited by pain & weakness per pt.  Gait Pattern: Decreased stride length;Trunk flexed;Antalgic Stairs: No Wheelchair Mobility Wheelchair Mobility: No      PT Goals Acute Rehab PT Goals Time For Goal Achievement: 05/13/12 Potential to Achieve Goals: Good PT Goal: Rolling Supine to Left Side - Progress: Progressing toward goal PT Goal: Supine/Side to Sit - Progress: Progressing toward goal PT Goal: Sit to Stand - Progress: Progressing toward goal PT Goal: Stand to Sit - Progress: Progressing toward goal PT Goal: Ambulate - Progress: Progressing toward goal  Visit Information  Last PT Received On: 05/09/12 Assistance Needed: +2    Subjective Data      Cognition  Overall Cognitive Status: Appears within functional limits for tasks assessed/performed Orientation Level: Appears intact for tasks assessed Behavior During Session: [facility] for tasks performed    Balance     End of Session PT - End of Session Equipment Utilized During Treatment: Gait belt;Back brace Activity Tolerance: Patient limited by pain Patient left: in chair;with call bell/phone within reach Nurse Communication: Mobility status     Verdell Face, [place] [phone] 05/09/2012

## 2012-05-09 NOTE — Progress Notes (Signed)
Subjective:  Uncomfortable with mild abdominal pain.  Not SOB.  No chewst pain.  Objective:  Vital Signs in the last 24 hours: BP 147/64  Pulse 103  Temp 99.1 F (37.3 C) (Oral)  Resp 20  Ht 6\' 1"  (1.854 m)  Wt 96.48 kg (212 lb 11.2 oz)  BMI 28.06 kg/m2  SpO2 94%  Physical Exam: Pleasant WM  Appears uncomfortable Lungs:  Clear Cardiac:  Regular rhythm, normal S1 and S2, no S3 Abdomen:  Mild tender, bowel sounds present Extremities:  No edema present  Intake/Output from previous day: 07/12 0701 - 07/13 0700 In: 1100 [I.V.:1100] Out: 1800 [Urine:400; Emesis/NG output:1400] Weight Filed Weights   05/05/12 2355  Weight: 96.48 kg (212 lb 11.2 oz)    Lab Results: Basic Metabolic Panel:  Basename 05/09/12 0530 05/08/12 1114  NA 139 137  K 4.9 3.8  CL 108 102  CO2 26 27  GLUCOSE 206* 241*  BUN 10 13  CREATININE 0.70 0.70   CBC:  Basename 05/09/12 0530 05/07/12 0930 05/06/12 0931  WBC 5.5 11.8* --  NEUTROABS -- -- 10.3*  HGB 9.4* 10.5* --  HCT 27.2* 29.9* --  MCV 90.4 90.3 --  PLT 143* 127* --    BNP    Component Value Date/Time   PROBNP 636.7* 05/07/2012 0930   Tele Sinus rhythm  Assessment/Plan:  1. Systolic CHF stable 2. Presumed ileus - GI following 3. Blood loss anemia 4. Tachycardia stable  Rec:  Continue cardiac meds.  Await resolution of ileus.  Darden Palmer  MD [facility]  05/09/2012, 9:00 AM

## 2012-05-09 NOTE — Progress Notes (Signed)
Subjective: Patient reports Doing okay with regard to his back and legs however the ileus and abdominal distention is greatest discomfort  Objective: Vital signs in last 24 hours: Temp:  [97.9 F (36.6 C)-99.1 F (37.3 C)] 99.1 F (37.3 C) (07/12 2237) Pulse Rate:  [95-103] 103  (07/12 2237) Resp:  [18-20] 20  (07/12 2237) BP: (126-147)/(64-81) 147/64 mmHg (07/12 2237) SpO2:  [94 %-97 %] 94 % (07/12 2237)  Intake/Output from previous day: 07/12 0701 - 07/13 0700 In: 1100 [I.V.:1100] Out: 1800 [Urine:400; Emesis/NG output:1400] Intake/Output this shift:    strength is 5 out of 5 wound is clean and dry his abdomen is distended  Lab Results:  Basename 05/09/12 0530 05/07/12 0930  WBC 5.5 11.8*  HGB 9.4* 10.5*  HCT 27.2* 29.9*  PLT 143* 127*   BMET  Basename 05/09/12 0530 05/08/12 1114  NA 139 137  K 4.9 3.8  CL 108 102  CO2 26 27  GLUCOSE 206* 241*  BUN 10 13  CREATININE 0.70 0.70  CALCIUM 8.6 8.4    Studies/Results: Dg Abd Acute W/chest  05/07/2012  *RADIOLOGY REPORT*  Clinical Data: 71 year old male with abdominal distention pain nausea and vomiting.  ACUTE ABDOMEN SERIES (ABDOMEN 2 VIEW & CHEST 1 VIEW)  Comparison: 01/01/2012.  Findings: Slightly lower lung volumes.  Stable cardiac size and mediastinal contours.  Visualized tracheal air column is within normal limits.  No pneumothorax or pneumoperitoneum.  No significant change in mild retrocardiac streaky opacity.  New prominent gas filled small bowel in the left lower quadrant, up to 48 mm diameter.  Less dilated mid abdominal gas filled small bowel.  Distal small bowel appears decompressed.  There is gas in the colon.  Postoperative changes to the spine are new since March. Air-fluid levels in the small bowel on the lateral decubitus view.  IMPRESSION: 1.  No free air.  Bowel gas pattern could reflect either early or partial small bowel obstruction versus small bowel ileus. 2.  Mild retrocardiac atelectasis.  Original  Report Authenticated By: Harley Hallmark, M.D.    Assessment/Plan: Postop day 4 from a lumbar fusion with presumed postoperative ileus he did say started past gas yesterday. But he still fairly distended and uncomfortable. Gastrology and cardiologist are still following.  LOS: 4 days     Osei Anger P 05/09/2012, 8:56 AM

## 2012-05-10 ENCOUNTER — Inpatient Hospital Stay (HOSPITAL_COMMUNITY): Payer: Medicare Other

## 2012-05-10 LAB — GLUCOSE, CAPILLARY
Glucose-Capillary: 189 mg/dL — ABNORMAL HIGH (ref 70–99)
Glucose-Capillary: 184 mg/dL — ABNORMAL HIGH (ref 70–99)
Glucose-Capillary: 143 mg/dL — ABNORMAL HIGH (ref 70–99)
Glucose-Capillary: 175 mg/dL — ABNORMAL HIGH (ref 70–99)

## 2012-05-10 LAB — BASIC METABOLIC PANEL
BUN: 7 mg/dL (ref 6–23)
CO2: 25 mEq/L (ref 19–32)
Calcium: 8.6 mg/dL (ref 8.4–10.5)
Chloride: 105 mEq/L (ref 96–112)
Creatinine, Ser: 0.66 mg/dL (ref 0.50–1.35)
GFR calc Af Amer: >90 mL/min (ref >90)
GFR calc non Af Amer: >90 mL/min (ref >90)
Glucose, Bld: 194 mg/dL — ABNORMAL HIGH (ref 70–99)
Potassium: 3.8 mEq/L (ref 3.5–5.1)
Sodium: 138 mEq/L (ref 135–145)

## 2012-05-10 MED ORDER — KETOROLAC TROMETHAMINE 15 MG/ML IJ SOLN
15.0000 mg | Freq: Three times a day (TID) | INTRAMUSCULAR | Status: AC
  Administered 2012-05-10 – 2012-05-11 (×4): 15 mg via INTRAVENOUS
  Filled 2012-05-10 (×6): qty 1

## 2012-05-10 NOTE — Progress Notes (Addendum)
Subjective:  Uncomfortable with mild abdominal pain.  NG tube clamped.  Not passing gas.  No real nausea able to walk in hall.  Not SOB.  Objective:  Vital Signs in the last 24 hours: BP 132/53  Pulse 90  Temp 98.2 F (36.8 C) (Oral)  Resp 20  Ht 6\' 1"  (1.854 m)  Wt 96.48 kg (212 lb 11.2 oz)  BMI 28.06 kg/m2  SpO2 96%  Physical Exam: Pleasant WM  Appears uncomfortable Lungs:  Clear Cardiac:  Regular rhythm, normal S1 and S2, no S3 Abdomen:  Mild tender, bowel sounds present Extremities:  No edema present  Intake/Output from previous day: 07/13 0701 - 07/14 0700 In: -  Out: 2100 [Urine:2100] Weight Filed Weights   05/05/12 2355  Weight: 96.48 kg (212 lb 11.2 oz)    Lab Results: Basic Metabolic Panel:  Basename 05/10/12 0644 05/09/12 0530  NA 138 139  K 3.8 4.9  CL 105 108  CO2 25 26  GLUCOSE 194* 206*  BUN 7 10  CREATININE 0.66 0.70   CBC:  Basename 05/09/12 0530  WBC 5.5  NEUTROABS --  HGB 9.4*  HCT 27.2*  MCV 90.4  PLT 143*    BNP    Component Value Date/Time   PROBNP 636.7* 05/07/2012 0930   Tele Sinus rhythm  Assessment/Plan:  1. Systolic CHF stable 2. Ileus - GI following 3. Blood loss anemia recent 4. Tachycardia stable  Rec:  Continue cardiac meds.  Await resolution of ileus.  Darden Palmer  MD [facility]  05/10/2012, 10:05 AM

## 2012-05-10 NOTE — Progress Notes (Signed)
Patient has NG tube to left nare, tube taped up ward to forehead. Upon assessment noted tender to touch and there may be a possible ulcer under the tubing. Requested to patient to let me look under by removing tape. Pt refused. Will continue to monitor.  Olevia Perches

## 2012-05-10 NOTE — Progress Notes (Signed)
Patient ID: [name], male   DOB: 1941/02/23, 71 y.o.   MRN: [id number] Vital signs stable. Patient feels more nausea today than previously. NG tube still in place. However patient has been mobilizing and has had some gas per rectum.  Incisions clean and dry on back.  Hopeful for some improvement near future. We'll use Toradol for pain.

## 2012-05-10 NOTE — Progress Notes (Signed)
[facility] Progress Note  Subjective: Abd pain and distention have worsened. No BMs or flatus overnight. Had some flatus this am  Objective:  Vital signs in last 24 hours: Temp:  [97.9 F (36.6 C)-99.5 F (37.5 C)] 98 F (36.7 C) (07/14 1000) Pulse Rate:  [74-90] 86  (07/14 1000) Resp:  [18-20] 20  (07/14 1000) BP: (112-134)/(43-69) 134/69 mmHg (07/14 1000) SpO2:  [94 %-97 %] 96 % (07/14 1000) Last BM Date: 05/05/12 (treating ilieus)  General:   Alert, well-developed, white male, uncomfortable Heart:  Regular rate and rhythm; no murmurs Abdomen:  Soft, mild diffuse tenderness and more distended. Hyperactive bowel sounds, without guarding, and without rebound.   Extremities:  Without edema. Neurologic:  Alert and  oriented x4;  grossly normal neurologically. Psych:  Alert and cooperative. Normal mood and affect.  Intake/Output from previous day: 07/13 0701 - 07/14 0700 In: -  Out: 2100 [Urine:2100]    Lab Results:  Basename 05/09/12 0530  WBC 5.5  HGB 9.4*  HCT 27.2*  PLT 143*   BMET  Basename 05/10/12 0644 05/09/12 0530 05/08/12 1114  NA 138 139 137  K 3.8 4.9 3.8  CL 105 108 102  CO2 25 26 27   GLUCOSE 194* 206* 241*  BUN 7 10 13   CREATININE 0.66 0.70 0.70  CALCIUM 8.6 8.6 8.4   Assessment / Plan:  1. Worsening ileus or early partial SBO. Suspect post-op ileus. Resume NPO and NG suction. Check abd films. 2. Constipation related to #1. 3. Anemia.  Active Problems:  Abdominal pain, generalized  Paralytic ileus   LOS: 5 days   Griffin Dewilde T. Russella Dar MD [facility]  05/10/2012, 11:51 AM

## 2012-05-10 NOTE — Progress Notes (Signed)
Physical Therapy Treatment Patient Details Name: [name] MRN: [id number] DOB: August 09, 1941 Today's Date: 05/10/2012 Time: 4098-1191 PT Time Calculation (min): 28 min  PT Assessment / Plan / Recommendation Comments on Treatment Session  Pt able to increase ambulation today with encouragement.  Continues with decreased tolerance for activity.    Follow Up Recommendations  Home health PT    Barriers to Discharge        Equipment Recommendations  None recommended by PT    Recommendations for Other Services    Frequency Min 5X/week   Plan Discharge plan remains appropriate;Frequency remains appropriate    Precautions / Restrictions Precautions Precautions: Back Precaution Comments: reviewed all 3 back precautions Required Braces or Orthoses: Spinal Brace Spinal Brace: Lumbar corset;Applied in sitting position Restrictions Weight Bearing Restrictions: No   Pertinent Vitals/Pain Pt premedicated.     Mobility  Bed Mobility Bed Mobility: Rolling Left;Left Sidelying to Sit;Sitting - Scoot to Edge of Bed Rolling Left: 4: Min assist;With rail Left Sidelying to Sit: 1: +2 Total assist;HOB flat;With rails Left Sidelying to Sit: Patient Percentage: 60% Sitting - Scoot to Edge of Bed: 4: Min assist Details for Bed Mobility Assistance: Cues for sequence, assist to bring LE's off EOB as well as at trunk to sitting. Transfers Transfers: Sit to Stand;Stand to Sit Sit to Stand: 1: +2 Total assist;With upper extremity assist;With armrests;From bed;From chair/3-in-1 Sit to Stand: Patient Percentage: 60% Stand to Sit: 3: Mod assist;With upper extremity assist;To chair/3-in-1;With armrests Details for Transfer Assistance: Cues for technique.  Assist to achieve standing and to control descent to chair.  Tends to lean posterior with sit-stand. Ambulation/Gait Ambulation/Gait Assistance: 4: Min assist;Other (comment) (+1 to follow with chair) Ambulation Distance (Feet): 25 Feet  (twice) Assistive device: Rolling walker Ambulation/Gait Assistance Details: Cues for upright posture and to stay close to RW.  Encouragement to increase distance. Gait Pattern: Decreased stride length;Trunk flexed;Antalgic        PT Goals Acute Rehab PT Goals Time For Goal Achievement: 05/13/12 Potential to Achieve Goals: Good PT Goal: Rolling Supine to Left Side - Progress: Progressing toward goal PT Goal: Supine/Side to Sit - Progress: Progressing toward goal PT Goal: Sit to Stand - Progress: Progressing toward goal PT Goal: Stand to Sit - Progress: Progressing toward goal PT Goal: Ambulate - Progress: Progressing toward goal  Visit Information  Last PT Received On: 05/10/12 Assistance Needed: +2    Subjective Data  Subjective: "I know I need to do it.  I am just so weak."   Cognition  Overall Cognitive Status: Appears within functional limits for tasks assessed/performed Arousal/Alertness: Awake/alert Orientation Level: Appears intact for tasks assessed Behavior During Session: [facility] for tasks performed    Balance  Balance Balance Assessed: Yes Static Sitting Balance Static Sitting - Balance Support: Bilateral upper extremity supported;Feet supported Static Sitting - Level of Assistance: 5: Stand by assistance Static Sitting - Comment/# of Minutes: 5  End of Session PT - End of Session Equipment Utilized During Treatment: Gait belt;Back brace Activity Tolerance: Patient limited by fatigue Patient left: in chair;with call bell/phone within reach Nurse Communication: Mobility status    Newell Coral 05/10/2012, 10:37 AM  Newell Coral, PTA Acute Rehab [phone] (office)

## 2012-05-11 LAB — BASIC METABOLIC PANEL
BUN: 8 mg/dL (ref 6–23)
CO2: 23 mEq/L (ref 19–32)
Calcium: 8.8 mg/dL (ref 8.4–10.5)
Chloride: 106 mEq/L (ref 96–112)
Creatinine, Ser: 0.6 mg/dL (ref 0.50–1.35)
GFR calc Af Amer: >90 mL/min (ref >90)
GFR calc non Af Amer: >90 mL/min (ref >90)
Glucose, Bld: 192 mg/dL — ABNORMAL HIGH (ref 70–99)
Potassium: 3.7 mEq/L (ref 3.5–5.1)
Sodium: 138 mEq/L (ref 135–145)

## 2012-05-11 LAB — GLUCOSE, CAPILLARY
Glucose-Capillary: 91 mg/dL (ref 70–99)
Glucose-Capillary: 171 mg/dL — ABNORMAL HIGH (ref 70–99)
Glucose-Capillary: 162 mg/dL — ABNORMAL HIGH (ref 70–99)
Glucose-Capillary: 136 mg/dL — ABNORMAL HIGH (ref 70–99)

## 2012-05-11 NOTE — Progress Notes (Signed)
Occupational Therapy Treatment Patient Details Name: [name] MRN: [id number] DOB: 11/15/1940 Today's Date: 05/11/2012 Time: 7829-5621 OT Time Calculation (min): 25 min  OT Assessment / Plan / Recommendation Comments on Treatment Session Pt continues to be limited by pain and fatigue.  Session limited today also due to RN unable to clamp NG tube from wall attachement in order for therapy to perform further mobility.   Updating d/c plan to SNF vs HHOT pending pt's progress.  Pt will need to make significant progress to d/c home.    Follow Up Recommendations  Home health OT;Supervision/Assistance - 24 hour (vs SNF pending pt progress)    Barriers to Discharge       Equipment Recommendations  None recommended by PT (TBD by OT)    Recommendations for Other Services    Frequency     Plan Discharge plan needs to be updated    Precautions / Restrictions Precautions Precautions: Back Spinal Brace: Lumbar corset;Applied in sitting position Restrictions Weight Bearing Restrictions: No   Pertinent Vitals/Pain Pt reports 4/10 back pain; pre-medicated and repositioned    ADL  Grooming: Simulated;Minimal assistance;Wash/dry face Where Assessed - Grooming: Supported standing (unilateral support) Lower Body Dressing: Simulated;Moderate assistance Where Assessed - Lower Body Dressing: Unsupported sitting Toilet Transfer: Simulated;+2 Total assistance Toilet Transfer: Patient Percentage: 70% Toilet Transfer Method: Sit to Barista:  (to/from bed) Toileting - Architect and Hygiene: Simulated;Minimal assistance Where Assessed - Engineer, mining and Hygiene: Standing Equipment Used: Back brace;Gait belt;Rolling walker Transfers/Ambulation Related to ADLs: pt with shuffling gait and tripped over feet while turning near end of session requiring assist to keep balance.  ADL Comments: Pt able to cross both ankles over knees but has difficulty  reaching feet in this position without strain on back. Pt will benefit from AE education, but pt would prefer wife in room (she had just left to go home).    OT Diagnosis:    OT Problem List:   OT Treatment Interventions:     OT Goals ADL Goals ADL Goal: Grooming - Progress: Progressing toward goals ADL Goal: Toilet Transfer - Progress: Progressing toward goals ADL Goal: Additional Goal #1 - Progress: Progressing toward goals  Visit Information  Last OT Received On: 05/11/12 Assistance Needed: +1 PT/OT Co-Evaluation/Treatment: Yes    Subjective Data      Prior Functioning       Cognition  Overall Cognitive Status: Appears within functional limits for tasks assessed/performed Arousal/Alertness: Awake/alert Orientation Level: Appears intact for tasks assessed Behavior During Session: [facility] for tasks performed    Mobility Bed Mobility Bed Mobility: Rolling Right;Sit to Sidelying Left Rolling Right: 5: Supervision Rolling Right: Patient Percentage: 60% Sit to Sidelying Left: 4: Min assist;HOB flat Details for Bed Mobility Assistance: cues for sequencing of bed mobility, reinforcement of back precuations with rolling and sit->sidelying Transfers Sit to Stand: 1: +2 Total assist;With armrests;From chair/3-in-1 Sit to Stand: Patient Percentage: 70% Stand to Sit: 1: +2 Total assist;To bed;With upper extremity assist Stand to Sit: Patient Percentage: 70% Details for Transfer Assistance: hand over hand to bring hands back to bed, pt fearful of letting go of RW to sit on side of bed; bilateral facilitation for power up to stand from chair (got stuck mid stand with weak bil LE)   Exercises    Balance    End of Session OT - End of Session Equipment Utilized During Treatment: Gait belt;Back brace Activity Tolerance: Patient limited by fatigue;Patient limited by pain;Other (comment)  Patient left: in bed;with call bell/phone within reach;with family/visitor present;with bed alarm  set Nurse Communication: Mobility status;Patient requests pain meds  GO     Jacelynn Hayton 05/11/2012, 4:02 PM

## 2012-05-11 NOTE — Progress Notes (Signed)
Subjective:  Denies any shortness of breath, chest pain. NG tube is in left nare. Passing some gas.  Objective:  Vital Signs in the last 24 hours: Temp:  [97.8 F (36.6 C)-100.3 F (37.9 C)] 97.8 F (36.6 C) (07/15 0933) Pulse Rate:  [75-89] 85  (07/15 0933) Resp:  [18-22] 18  (07/15 0933) BP: (120-137)/(50-70) 125/58 mmHg (07/15 0933) SpO2:  [95 %-100 %] 100 % (07/15 0933)  Intake/Output from previous day: 07/14 0701 - 07/15 0700 In: 4615 [I.V.:4615] Out: 4200 [Urine:3400; Emesis/NG output:800]   Physical Exam: General: Minimally uncomfortable in bed in no acute distress. Head:  NG tube in place. Lungs: Clear to auscultation and percussion. Heart: Normal S1 and S2. Soft systolic murmur, rubs or gallops.  Abdomen: Mildly distended, hypoactive BS. Extremities: No clubbing or cyanosis. No edema. Neurologic: Alert and oriented x 3.    Lab Results:  Basename 05/09/12 0530  WBC 5.5  HGB 9.4*  PLT 143*    Basename 05/11/12 0635 05/10/12 0644  NA 138 138  K 3.7 3.8  CL 106 105  CO2 23 25  GLUCOSE 192* 194*  BUN 8 7  CREATININE 0.60 0.66   Telemetry: NSR, heart rate consistently below 100, occasional PVCs, no adverse arrhythmias Personally viewed.   Cardiac Studies:  Ejection fraction 35-40%  Assessment/Plan:  Active Problems:  Abdominal pain, generalized  Paralytic ileus  1. Cardiomyopathy-no evidence of fluid overload/dyspnea. Comfortable laying flat. Continue to monitor closely in the light of IV fluid administration. He is not currently taking in by mouth liquid. Continue with ACE inhibitor  2. Tachycardia-resolved. I will discontinue telemetry. No adverse arrhythmias were detected. No signs of atrial fibrillation. Occasional PVCs. Continue with metoprolol  3. ileus-per primary team.  4. 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 Last catheterization in January of 2012.   [name] 05/11/2012, 1:30 PM

## 2012-05-11 NOTE — Progress Notes (Signed)
Patient has been OOB for 2-3 times today and walked in the hallway without any problem, he tolerated it well without any SOB.back to bed now with suction connected now.

## 2012-05-11 NOTE — Progress Notes (Signed)
     [facility] Daily Rounding Note 05/11/2012, 11:52 AM  SUBJECTIVE:       Passing some flatus, last stool was 6 days ago before surgery.  No pain.  NGT inadvertently clamped overnight ofrom about 10 PM to 10 AM.  No noxious s/e from  This.  There is about 150 cc of green bilious fluid in canister.  He is sipping on water and ice chips.  No  Nausea, no belly pain. Some flatus.  Distention seems about the same to him.  OBJECTIVE:         Vital signs in last 24 hours:    Temp:  [97.8 F (36.6 C)-100.3 F (37.9 C)] 97.8 F (36.6 C) (07/15 0933) Pulse Rate:  [75-89] 85  (07/15 0933) Resp:  [18-22] 18  (07/15 0933) BP: (120-137)/(50-70) 125/58 mmHg (07/15 0933) SpO2:  [95 %-100 %] 100 % (07/15 0933) Last BM Date: 05/05/12 General: looks aged, tired, not acutely ill   Heart: RRR Chest: crackles in bases.  Not sob Abdomen: distended, soft, BS hypoactive.   Extremities: no pedal edema Neuro/Psych:  Pleasant,  Telling  Jokes.  A bit discouraged about lack of progress.   Intake/Output from previous day: 07/14 0701 - 07/15 0700 In: 4615 [I.V.:4615] Out: 4200 [Urine:3400; Emesis/NG output:800]  Intake/Output this shift:    Lab Results:  Basename 05/09/12 0530  WBC 5.5  HGB 9.4*  HCT 27.2*  PLT 143*   BMET  Basename 05/11/12 0635 05/10/12 0644 05/09/12 0530  NA 138 138 139  K 3.7 3.8 4.9  CL 106 105 108  CO2 23 25 26   GLUCOSE 192* 194* 206*  BUN 8 7 10   CREATININE 0.60 0.66 0.70  CALCIUM 8.8 8.6 8.6   Studies/Results: Dg Abd Acute W/chest 05/10/2012  *RADIOLOGY REPORT*  Clinical Data: Abdominal pain and distention. Nausea and vomiting. Small bowel obstruction.  ACUTE ABDOMEN SERIES (ABDOMEN 2 VIEW & CHEST 1 VIEW)  Comparison: 05/07/2012  Findings: There is been placement nasogastric tube since previous study, with tip seen overlying the descending duodenum.  Multiple dilated bowel loops are again seen within the left lower quadrant which show no significant change.  Decreased  colonic gas is demonstrated.  This is consistent with a distal small bowel obstruction.  There is no evidence of free air.  Right upper quadrant surgical clips seen from prior cholecystectomy.  Previous lumbar spine fusion also noted.  Heart size is stable.  Both lungs are clear.  No evidence of pleural effusion.  IMPRESSION:  1.  Stable appearance of distal small bowel obstruction.  New nasogastric tube tip in proximal duodenum. 2.  No active lung disease.  Original Report Authenticated By: Danae Orleans, M.D.    ASSESMENT: *  SB ileus vs partial SBO post spine surgery.  On low dose Reglan IV BID, po Protonix, chips/sips.  Still getting Morphine, last dose 0850 today.  *  Diabetes. On oral agents PTA *  Needs at least 100/hour of IVF, given limited po and NGT.  PLAN:  *  Will let him sip 7 up or gingerale.  Leave NGT in place.  Do we need to get gen surgery involved? *  Fluids to 100/hour.     LOS: 6 days   [name]  05/11/2012, 11:52 AM Pager: [phone]

## 2012-05-11 NOTE — Progress Notes (Signed)
Physical Therapy Treatment Patient Details Name: [name] MRN: [id number] DOB: June 30, 1941 Today's Date: 05/11/2012 Time: 1191-4782 PT Time Calculation (min): 26 min  PT Assessment / Plan / Recommendation Comments on Treatment Session  Progressing with ambulation. Lower extremities still weak making sit<>stand more difficult. Encouraged pt to ambulate with nursing staff daily.     Follow Up Recommendations  Home health PT    Barriers to Discharge        Equipment Recommendations  None recommended by PT    Recommendations for Other Services    Frequency Min 5X/week   Plan Discharge plan remains appropriate;Frequency remains appropriate    Precautions / Restrictions Precautions Precautions: Back Spinal Brace: Lumbar corset;Applied in sitting position       Mobility  Bed Mobility Bed Mobility: Rolling Right;Sit to Sidelying Left Rolling Right: 5: Supervision (partial roll from left sidelying to supine) Sit to Sidelying Left: 4: Min assist;HOB flat Details for Bed Mobility Assistance: cues for sequencing of bed mobility, reinforcement of back precuations with rolling and sit->sidelying Transfers Transfers: Sit to Stand;Stand to Sit Sit to Stand: 1: +2 Total assist;With armrests;From chair/3-in-1 Sit to Stand: Patient Percentage: 70% Stand to Sit: 1: +2 Total assist;To bed;With upper extremity assist Stand to Sit: Patient Percentage: 70% Details for Transfer Assistance: hand over hand to bring hands back to bed, pt fearful of letting go of RW to sit on side of bed; bilateral facilitation for power up to stand from chair (got stuck mid stand with weak bil LE) Ambulation/Gait Ambulation/Gait Assistance: 4: Min assist Ambulation Distance (Feet): 150 Feet Assistive device: Rolling walker Ambulation/Gait Assistance Details: cues for upright posture, relaxed shoulders and forward gaze; ambulates with very narrow BOS and staggered with turning, LLE got caught on his RLE when turning  back into his room requiring minA to correct and stabilize Gait Pattern: Trunk flexed;Narrow base of support    Exercises      PT Goals Acute Rehab PT Goals PT Goal: Rolling Supine to Right Side - Progress: Progressing toward goal PT Goal: Sit to Supine/Side - Progress: Progressing toward goal PT Goal: Sit to Stand - Progress: Progressing toward goal PT Goal: Stand to Sit - Progress: Progressing toward goal PT Goal: Ambulate - Progress: Progressing toward goal  Visit Information  Last PT Received On: 05/11/12 Assistance Needed: +2    Subjective Data  Subjective: Well I've been sitting in this chair for about 2 hours now.    Cognition  Overall Cognitive Status: Appears within functional limits for tasks assessed/performed Arousal/Alertness: Awake/alert Orientation Level: Appears intact for tasks assessed Behavior During Session: [facility] for tasks performed    Balance     End of Session PT - End of Session Equipment Utilized During Treatment: Gait belt Activity Tolerance: Patient tolerated treatment well Patient left: in bed;with call bell/phone within reach Nurse Communication: Mobility status   GP     [facility] HELEN 05/11/2012, 3:10 PM

## 2012-05-11 NOTE — Progress Notes (Signed)
Patient seen, examined, and I agree with the above documentation, including the assessment and plan. Patient now out of bed to chair, and feeling okay. He has had several small episodes of flatus today. No bowel movement. For now would continue with supportive management, and I do not think general surgery needs to be involved at this point. Based on his course, I favor ileus over partial small bowel obstruction Continue NG tube suction for now, sips of clears is okay. He is on low-dose Reglan. Agree with out of bed to chair and ambulating as much as possible. We need to avoid narcotics whenever possible, and the patient is aware of this. Monitor electrolytes and keep normal, try to keep potassium greater than 4.0

## 2012-05-12 LAB — GLUCOSE, CAPILLARY
Glucose-Capillary: 187 mg/dL — ABNORMAL HIGH (ref 70–99)
Glucose-Capillary: 164 mg/dL — ABNORMAL HIGH (ref 70–99)
Glucose-Capillary: 178 mg/dL — ABNORMAL HIGH (ref 70–99)

## 2012-05-12 LAB — BASIC METABOLIC PANEL
BUN: 8 mg/dL (ref 6–23)
CO2: 23 mEq/L (ref 19–32)
Calcium: 8.8 mg/dL (ref 8.4–10.5)
Chloride: 109 mEq/L (ref 96–112)
Creatinine, Ser: 0.68 mg/dL (ref 0.50–1.35)
GFR calc Af Amer: >90 mL/min (ref >90)
GFR calc non Af Amer: >90 mL/min (ref >90)
Glucose, Bld: 186 mg/dL — ABNORMAL HIGH (ref 70–99)
Potassium: 3.7 mEq/L (ref 3.5–5.1)
Sodium: 141 mEq/L (ref 135–145)

## 2012-05-12 NOTE — Progress Notes (Signed)
SUBJECTIVE:  Feels good, no SOB.  Had a BM today  OBJECTIVE:   Vitals:   Filed Vitals:   05/11/12 2116 05/12/12 0141 05/12/12 0545 05/12/12 1046  BP: 139/72 119/45 118/54 120/62  Pulse: 75 77 72 76  Temp: 98.5 F (36.9 C) 98 F (36.7 C) 98.5 F (36.9 C) 98.2 F (36.8 C)  TempSrc: Oral Oral Oral Oral  Resp: 17 16 16 18   Height:      Weight:      SpO2: 97% 97% 97% 98%   I&O's:   Intake/Output Summary (Last 24 hours) at 05/12/12 1148 Last data filed at 05/12/12 1000  Gross per 24 hour  Intake   1100 ml  Output   1850 ml  Net   -750 ml     PHYSICAL EXAM General: Well developed, well nourished, in no acute distress Lungs:   Clear bilaterally to auscultation and percussion. Heart:   HRRR S1 S2 Pulses are 2+ & equal. Abdomen: Bowel sounds are positive, abdomen soft and non-tender without masses  Extremities:   No clubbing, cyanosis or edema.  DP +1 Neuro: Alert and oriented X 3. Psych:  Good affect, responds appropriately   LABS: Basic Metabolic Panel:  Basename 05/12/12 0510 05/11/12 0635  NA 141 138  K 3.7 3.7  CL 109 106  CO2 23 23  GLUCOSE 186* 192*  BUN 8 8  CREATININE 0.68 0.60  CALCIUM 8.8 8.8  MG -- --  PHOS -- --   Coag Panel:   Lab Results  Component Value Date   INR 1.08 11/25/2010   INR 1.4 03/31/2008   INR 1.0 03/29/2008    RADIOLOGY: Dg Lumbar Spine 2-3 Views  05/05/2012  *RADIOLOGY REPORT*  Clinical Data: L3-L5 PLIF.  OPERATIVE LUMBAR SPINE - 2-3 VIEW  Comparison: MRI lumbar spine 02/28/2012 [facility].  Findings: Images were submitted for interpretation post- operatively.  The same numbering scheme will be utilized as on the prior MRI, with L5 representing the last lumbar segment.  Initial image at 0830 hours demonstrates clamps on the spinous processes of L4 and L5.  Second image at 0910 hours demonstrates the localizer probe projected over the L3-4 disc space.  IMPRESSION: L3-4 localized intraoperatively.  Original Report Authenticated By:  Arnell Sieving, M.D.   Dg Lumbar Spine 2-3 Views  05/05/2012  *RADIOLOGY REPORT*  Clinical Data: L3-L5 FIXATION.  DG C-ARM 1-60 MIN,LUMBAR SPINE - 2-3 VIEW  Comparison: Intraoperative imaging of same date.  MRI of 02/28/2012.  Findings: Intraoperative AP and lateral images.  These demonstrate trans pedicle screw fixation at 3 levels.  Favored to be L3-L5. The lateral view is degraded by underpenetration.  Interbody fusion material centrally positioned.  No gross acute hardware complication identified.  IMPRESSION: Intraoperative imaging of lumbar fixation, likely L3-L5.  Original Report Authenticated By: Consuello Bossier, M.D.   Dg Abd Acute W/chest  05/10/2012  *RADIOLOGY REPORT*  Clinical Data: Abdominal pain and distention. Nausea and vomiting. Small bowel obstruction.  ACUTE ABDOMEN SERIES (ABDOMEN 2 VIEW & CHEST 1 VIEW)  Comparison: 05/07/2012  Findings: There is been placement nasogastric tube since previous study, with tip seen overlying the descending duodenum.  Multiple dilated bowel loops are again seen within the left lower quadrant which show no significant change.  Decreased colonic gas is demonstrated.  This is consistent with a distal small bowel obstruction.  There is no evidence of free air.  Right upper quadrant surgical clips seen from prior cholecystectomy.  Previous lumbar spine  fusion also noted.  Heart size is stable.  Both lungs are clear.  No evidence of pleural effusion.  IMPRESSION:  1.  Stable appearance of distal small bowel obstruction.  New nasogastric tube tip in proximal duodenum. 2.  No active lung disease.  Original Report Authenticated By: Danae Orleans, M.D.   Dg Abd Acute W/chest  05/07/2012  *RADIOLOGY REPORT*  Clinical Data: 71 year old male with abdominal distention pain nausea and vomiting.  ACUTE ABDOMEN SERIES (ABDOMEN 2 VIEW & CHEST 1 VIEW)  Comparison: 01/01/2012.  Findings: Slightly lower lung volumes.  Stable cardiac size and mediastinal contours.  Visualized  tracheal air column is within normal limits.  No pneumothorax or pneumoperitoneum.  No significant change in mild retrocardiac streaky opacity.  New prominent gas filled small bowel in the left lower quadrant, up to 48 mm diameter.  Less dilated mid abdominal gas filled small bowel.  Distal small bowel appears decompressed.  There is gas in the colon.  Postoperative changes to the spine are new since March. Air-fluid levels in the small bowel on the lateral decubitus view.  IMPRESSION: 1.  No free air.  Bowel gas pattern could reflect either early or partial small bowel obstruction versus small bowel ileus. 2.  Mild retrocardiac atelectasis.  Original Report Authenticated By: Harley Hallmark, M.D.   Dg C-arm 1-60 Min  05/05/2012  *RADIOLOGY REPORT*  Clinical Data: L3-L5 FIXATION.  DG C-ARM 1-60 MIN,LUMBAR SPINE - 2-3 VIEW  Comparison: Intraoperative imaging of same date.  MRI of 02/28/2012.  Findings: Intraoperative AP and lateral images.  These demonstrate trans pedicle screw fixation at 3 levels.  Favored to be L3-L5. The lateral view is degraded by underpenetration.  Interbody fusion material centrally positioned.  No gross acute hardware complication identified.  IMPRESSION: Intraoperative imaging of lumbar fixation, likely L3-L5.  Original Report Authenticated By: Consuello Bossier, M.D.     ASSESSMENT:  1.  Cardiomyopathy with systolic CHF compensated with no evidence of fluid overload.  Continue ACE I. 2.  Ileus now with BM 3,  Blood loss anemia 4.  Tachycardia resolved 5.  CAD with chronic occlusion of RCA with left to right collaterals  PLAN:   Continue current therapy  Quintella Reichert, MD  05/12/2012  11:48 AM

## 2012-05-12 NOTE — Progress Notes (Signed)
Patient seen, examined, and I agree with the above documentation, including the assessment and plan. NG tube removed about 45 minutes ago. Tolerating clears. Had 2 large regular BMs today. Passing flatus. Walking and feeling much better. Will advance to full liquids. Call with questions

## 2012-05-12 NOTE — Progress Notes (Signed)
Patient just had a handful of BM, which is  both hard and formed. Will notify MD

## 2012-05-12 NOTE — Progress Notes (Signed)
Patient ID: [name], male   DOB: [birth year], 71 y.o.   MRN: [id number] Vital signs are stable. Patient is in much better spirits having had bowel movements and actually taking some by mouth. Nasogastric tube is still in place but clamped. Foley catheter is discontinued and patient has been ambulate.  Incision is clean and dry motor function is good in lower extremities.  Plan discontinue NG tube as suggested by gastrin intestinal medicine.

## 2012-05-12 NOTE — Progress Notes (Signed)
I have read and agree with the below note.  Ikey Omary Helen Whitlow PT, DPT Pager: [phone] 

## 2012-05-12 NOTE — Progress Notes (Signed)
     [facility] Daily Rounding Note 05/12/2012, 1:22 PM  SUBJECTIVE:       2 bms and lots of flatus this AM.  Says tube has been off suction for 3 or 4 hours.  Wants foley catheter out.  Walking halls.  Feels a lot better    OBJECTIVE:         Vital signs in last 24 hours:    Temp:  [97.6 F (36.4 C)-98.5 F (36.9 C)] 98.2 F (36.8 C) (07/16 1046) Pulse Rate:  [72-85] 76  (07/16 1046) Resp:  [16-18] 18  (07/16 1046) BP: (118-139)/(45-75) 120/62 mmHg (07/16 1046) SpO2:  [94 %-99 %] 98 % (07/16 1046) Last BM Date: 05/05/12 General: looks  Well    Heart: RRR Chest: clear B Abdomen: soft, hypoactive BS, no tympanitic BS  Extremities: no  Pedal edema Neuro/Psych:  Pleasant, talkative.   Intake/Output from previous day: 07/15 0701 - 07/16 0700 In: 1100 [I.V.:1100] Out: 250 [Emesis/NG output:250]  Intake/Output this shift: Total I/O In: -  Out: 1600 [Urine:800; Emesis/NG output:800]  Lab Results: No results found for this basename: WBC:3,HGB:3,HCT:3,PLT:3 in the last 72 hours BMET  Basename 05/12/12 0510 05/11/12 0635 05/10/12 0644  NA 141 138 138  K 3.7 3.7 3.8  CL 109 106 105  CO2 23 23 25   GLUCOSE 186* 192* 194*  BUN 8 8 7   CREATININE 0.68 0.60 0.66  CALCIUM 8.8 8.8 8.6    Studies/Results: Dg Abd Acute W/chest 05/10/2012  *RADIOLOGY REPORT*  Clinical Data: Abdominal pain and distention. Nausea and vomiting. Small bowel obstruction.  ACUTE ABDOMEN SERIES (ABDOMEN 2 VIEW & CHEST 1 VIEW)  Comparison: 05/07/2012  Findings: There is been placement nasogastric tube since previous study, with tip seen overlying the descending duodenum.  Multiple dilated bowel loops are again seen within the left lower quadrant which show no significant change.  Decreased colonic gas is demonstrated.  This is consistent with a distal small bowel obstruction.  There is no evidence of free air.  Right upper quadrant surgical clips seen from prior cholecystectomy.  Previous lumbar spine fusion  also noted.  Heart size is stable.  Both lungs are clear.  No evidence of pleural effusion.  IMPRESSION:  1.  Stable appearance of distal small bowel obstruction.  New nasogastric tube tip in proximal duodenum. 2.  No active lung disease.  Original Report Authenticated By: Danae Orleans, M.D.    ASSESMENT: * SB ileus vs partial SBO post spine surgery. On low dose Reglan IV BID, po Protonix, chips/sips.  Has had breakthrough with stools and flatus today.        PLAN: *  Leave NGT clamped.  See if tolerates clear trays and if so,  pull NGT later today.  *  Dc foley * SB ileus vs partial SBO post spine surgery. On low dose Reglan IV BID, po Protonix, chips/sips.   Pt had colonoscopy by Dr  Randa Evens in past and has seen [facility] in past, should return there for future routine GI screening or needs.      LOS: 7 days   Jennye Moccasin  05/12/2012, 1:22 PM Pager: [phone]

## 2012-05-12 NOTE — Progress Notes (Signed)
Physical Therapy Treatment Patient Details Name: [name] MRN: [id number] DOB: 11-Aug-1941 Today's Date: 05/12/2012 Time: 4098-1191 PT Time Calculation (min): 18 min  PT Assessment / Plan / Recommendation Comments on Treatment Session  Stairs attempted today as well as ambulation. Pt encouraged to ambulate with nursing staff daily.    Follow Up Recommendations  Home health PT    Barriers to Discharge        Equipment Recommendations       Recommendations for Other Services    Frequency Min 5X/week   Plan      Precautions / Restrictions Precautions Precautions: Back Required Braces or Orthoses: Spinal Brace Spinal Brace: Lumbar corset (was already wearing brace) Restrictions Weight Bearing Restrictions: No        Mobility  Bed Mobility Details for Bed Mobility Assistance: Patient sitting in chair upon presentation. Transfers Transfers: Sit to Stand;Stand to Sit Sit to Stand: With upper extremity assist;With armrests;4: Min assist;From chair/3-in-1 Stand to Sit: 4: Min guard;With upper extremity assist Transfer details: Pt slow to rise. Facilitation for follow through to standing. Ambulation/Gait Ambulation/Gait Assistance: 4: Min assist Ambulation Distance (Feet): 150 Feet Assistive device: Rolling walker Ambulation/Gait Assistance Details: cues for upright posture and to watch feet while turning  Gait Pattern: Trunk flexed;Narrow base of support Stairs: Yes Stairs Assistance: 4: Min assist Stair Management Technique: One rail Left Number of Stairs: 3       PT Goals Acute Rehab PT Goals PT Goal: Sit to Stand - Progress: Progressing toward goal PT Goal: Stand to Sit - Progress: Progressing toward goal PT Goal: Ambulate - Progress: Progressing toward goal PT Goal: Up/Down Stairs - Progress: Progressing toward goal  Visit Information  Last PT Received On: 05/12/12 Assistance Needed: +1    Subjective Data  Subjective: I am ready to go home now that I had a  bowel movement.   Cognition  Overall Cognitive Status: Appears within functional limits for tasks assessed/performed Arousal/Alertness: Awake/alert Orientation Level: Appears intact for tasks assessed Behavior During Session: [facility] for tasks performed Cognition - Other Comments: was excited to finally have had bowel movement    Balance     End of Session PT - End of Session Equipment Utilized During Treatment: Back brace;Gait belt Activity Tolerance: Patient tolerated treatment well Patient left: in chair;with call bell/phone within reach;with family/visitor present   GP     Bryn Perkin 05/12/2012, 11:27 AM

## 2012-05-13 LAB — GLUCOSE, CAPILLARY
Glucose-Capillary: 153 mg/dL — ABNORMAL HIGH (ref 70–99)
Glucose-Capillary: 204 mg/dL — ABNORMAL HIGH (ref 70–99)
Glucose-Capillary: 186 mg/dL — ABNORMAL HIGH (ref 70–99)

## 2012-05-13 LAB — BASIC METABOLIC PANEL
BUN: 4 mg/dL — ABNORMAL LOW (ref 6–23)
CO2: 23 mEq/L (ref 19–32)
Calcium: 8.9 mg/dL (ref 8.4–10.5)
Chloride: 107 mEq/L (ref 96–112)
Creatinine, Ser: 0.57 mg/dL (ref 0.50–1.35)
GFR calc Af Amer: >90 mL/min (ref >90)
GFR calc non Af Amer: >90 mL/min (ref >90)
Glucose, Bld: 154 mg/dL — ABNORMAL HIGH (ref 70–99)
Potassium: 3.5 mEq/L (ref 3.5–5.1)
Sodium: 141 mEq/L (ref 135–145)

## 2012-05-13 NOTE — Progress Notes (Signed)
SUBJECTIVE:  Feels much better  OBJECTIVE:   Vitals:   Filed Vitals:   05/13/12 0132 05/13/12 0510 05/13/12 1030 05/13/12 1400  BP: 117/55 121/52 126/79 134/82  Pulse: 78 78 109 83  Temp: 98.9 F (37.2 C) 98.6 F (37 C) 98.5 F (36.9 C) 98.1 F (36.7 C)  TempSrc: Oral Oral Oral Oral  Resp: 20 20 18 18   Height:      Weight:      SpO2: 95% 99% 99% 99%   I&O's:   Intake/Output Summary (Last 24 hours) at 05/13/12 1513 Last data filed at 05/13/12 0330  Gross per 24 hour  Intake  877.5 ml  Output      0 ml  Net  877.5 ml        PHYSICAL EXAM General: Well developed, well nourished, in no acute distress Head:    Normal cephalic and atramatic  Lungs:   Clear bilaterally to auscultation and percussion. Heart:   HRRR S1 S2  Msk:  Back normal,Normal strength and tone for age. Extremities:   No  edema.   Neuro: Alert and oriented X 3. Psych:  Normal affect, responds appropriately   LABS: Basic Metabolic Panel:  Basename 05/13/12 0540 05/12/12 0510  NA 141 141  K 3.5 3.7  CL 107 109  CO2 23 23  GLUCOSE 154* 186*  BUN 4* 8  CREATININE 0.57 0.68  CALCIUM 8.9 8.8  MG -- --  PHOS -- --   Liver Function Tests: No results found for this basename: AST:2,ALT:2,ALKPHOS:2,BILITOT:2,PROT:2,ALBUMIN:2 in the last 72 hours No results found for this basename: LIPASE:2,AMYLASE:2 in the last 72 hours CBC: No results found for this basename: WBC:2,NEUTROABS:2,HGB:2,HCT:2,MCV:2,PLT:2 in the last 72 hours Cardiac Enzymes: No results found for this basename: CKTOTAL:3,CKMB:3,CKMBINDEX:3,TROPONINI:3 in the last 72 hours BNP: No components found with this basename: POCBNP:3 D-Dimer: No results found for this basename: DDIMER:2 in the last 72 hours Hemoglobin A1C: No results found for this basename: HGBA1C in the last 72 hours Fasting Lipid Panel: No results found for this basename: CHOL,HDL,LDLCALC,TRIG,CHOLHDL,LDLDIRECT in the last 72 hours Thyroid Function Tests: No results  found for this basename: TSH,T4TOTAL,FREET3,T3FREE,THYROIDAB in the last 72 hours Anemia Panel: No results found for this basename: VITAMINB12,FOLATE,FERRITIN,TIBC,IRON,RETICCTPCT in the last 72 hours Coag Panel:   Lab Results  Component Value Date   INR 1.08 11/25/2010   INR 1.4 03/31/2008   INR 1.0 03/29/2008    RADIOLOGY: Dg Lumbar Spine 2-3 Views  05/05/2012  *RADIOLOGY REPORT*  Clinical Data: L3-L5 PLIF.  OPERATIVE LUMBAR SPINE - 2-3 VIEW  Comparison: MRI lumbar spine 02/28/2012 [facility].  Findings: Images were submitted for interpretation post- operatively.  The same numbering scheme will be utilized as on the prior MRI, with L5 representing the last lumbar segment.  Initial image at 0830 hours demonstrates clamps on the spinous processes of L4 and L5.  Second image at 0910 hours demonstrates the localizer probe projected over the L3-4 disc space.  IMPRESSION: L3-4 localized intraoperatively.  Original Report Authenticated By: Arnell Sieving, M.D.   Dg Lumbar Spine 2-3 Views  05/05/2012  *RADIOLOGY REPORT*  Clinical Data: L3-L5 FIXATION.  DG C-ARM 1-60 MIN,LUMBAR SPINE - 2-3 VIEW  Comparison: Intraoperative imaging of same date.  MRI of 02/28/2012.  Findings: Intraoperative AP and lateral images.  These demonstrate trans pedicle screw fixation at 3 levels.  Favored to be L3-L5. The lateral view is degraded by underpenetration.  Interbody fusion material centrally positioned.  No gross acute hardware complication identified.  IMPRESSION: Intraoperative imaging of lumbar fixation, likely L3-L5.  Original Report Authenticated By: Consuello Bossier, M.D.   Dg Abd Acute W/chest  05/10/2012  *RADIOLOGY REPORT*  Clinical Data: Abdominal pain and distention. Nausea and vomiting. Small bowel obstruction.  ACUTE ABDOMEN SERIES (ABDOMEN 2 VIEW & CHEST 1 VIEW)  Comparison: 05/07/2012  Findings: There is been placement nasogastric tube since previous study, with tip seen overlying the descending  duodenum.  Multiple dilated bowel loops are again seen within the left lower quadrant which show no significant change.  Decreased colonic gas is demonstrated.  This is consistent with a distal small bowel obstruction.  There is no evidence of free air.  Right upper quadrant surgical clips seen from prior cholecystectomy.  Previous lumbar spine fusion also noted.  Heart size is stable.  Both lungs are clear.  No evidence of pleural effusion.  IMPRESSION:  1.  Stable appearance of distal small bowel obstruction.  New nasogastric tube tip in proximal duodenum. 2.  No active lung disease.  Original Report Authenticated By: Danae Orleans, M.D.   Dg Abd Acute W/chest  05/07/2012  *RADIOLOGY REPORT*  Clinical Data: 71 year old male with abdominal distention pain nausea and vomiting.  ACUTE ABDOMEN SERIES (ABDOMEN 2 VIEW & CHEST 1 VIEW)  Comparison: 01/01/2012.  Findings: Slightly lower lung volumes.  Stable cardiac size and mediastinal contours.  Visualized tracheal air column is within normal limits.  No pneumothorax or pneumoperitoneum.  No significant change in mild retrocardiac streaky opacity.  New prominent gas filled small bowel in the left lower quadrant, up to 48 mm diameter.  Less dilated mid abdominal gas filled small bowel.  Distal small bowel appears decompressed.  There is gas in the colon.  Postoperative changes to the spine are new since March. Air-fluid levels in the small bowel on the lateral decubitus view.  IMPRESSION: 1.  No free air.  Bowel gas pattern could reflect either early or partial small bowel obstruction versus small bowel ileus. 2.  Mild retrocardiac atelectasis.  Original Report Authenticated By: Harley Hallmark, M.D.   Dg C-arm 1-60 Min  05/05/2012  *RADIOLOGY REPORT*  Clinical Data: L3-L5 FIXATION.  DG C-ARM 1-60 MIN,LUMBAR SPINE - 2-3 VIEW  Comparison: Intraoperative imaging of same date.  MRI of 02/28/2012.  Findings: Intraoperative AP and lateral images.  These demonstrate trans  pedicle screw fixation at 3 levels.  Favored to be L3-L5. The lateral view is degraded by underpenetration.  Interbody fusion material centrally positioned.  No gross acute hardware complication identified.  IMPRESSION: Intraoperative imaging of lumbar fixation, likely L3-L5.  Original Report Authenticated By: Consuello Bossier, M.D.      ASSESSMENT: Chronic systolic CHF; CAD  PLAN:  No sx or signs of CHF.  Euvolemic.  No angina.  COntinue current medical therapy.  Corky Crafts., MD  05/13/2012  3:13 PM

## 2012-05-13 NOTE — Progress Notes (Signed)
Physical Therapy Treatment Patient Details Name: TIM CORRIHER MRN: [id number] DOB: Sep 06, 1941 Today's Date: 05/13/2012 Time: 1478-2956 PT Time Calculation (min): 29 min  PT Assessment / Plan / Recommendation Comments on Treatment Session  Pt again encouraged to ambulate with nursing staff. Pt stated he felt comfortable with going up the stairs sideways. Discussed with pt and wife that wife will receive caregiver education-especially stairs and guarding-tomorrow.    Follow Up Recommendations  Home health PT    Barriers to Discharge        Equipment Recommendations  None recommended by PT (TBD by OT)    Recommendations for Other Services    Frequency Min 5X/week   Plan Discharge plan remains appropriate;Frequency remains appropriate    Precautions / Restrictions Precautions Precautions: Back Precaution Comments: reviewed back precautions Required Braces or Orthoses: Spinal Brace Spinal Brace: Lumbar corset Restrictions Weight Bearing Restrictions: No       Mobility  Bed Mobility Details for Bed Mobility Assistance: Pt sitting in chair upon presentation. Transfers Transfers: Sit to Stand;Stand to Sit Sit to Stand: 4: Min assist;With armrests;With upper extremity assist (one hand pushing down on walker, one on chair) Stand to Sit: With upper extremity assist;To chair/3-in-1;With armrests;4: Min guard Details for Transfer Assistance: Pt found sit->stand with one hand pushing down on the walker and one on the chair/bed easier due to weak bil LE. Ambulation/Gait Ambulation/Gait Assistance: 4: Min guard Ambulation Distance (Feet): 100 Feet Assistive device: Rolling walker Ambulation/Gait Assistance Details: cues for upright posture and keeping feet inside walker while ambulating Gait Pattern: Trunk flexed;Narrow base of support Stairs: Yes Stairs Assistance: 4: Min guard Stairs Assistance Details (indicate cue type and reason): Pt ascend/descend stairs going sideways using the  right hand rail with minimal verbal cueing for foot placement and safety Stair Management Technique: Two rails;Sideways Number of Stairs: 4       PT Goals Acute Rehab PT Goals PT Goal: Sit to Stand - Progress: Progressing toward goal PT Goal: Stand to Sit - Progress: Progressing toward goal PT Goal: Ambulate - Progress: Progressing toward goal PT Goal: Up/Down Stairs - Progress: Progressing toward goal  Visit Information  Last PT Received On: 05/13/12 Assistance Needed: +1    Subjective Data  Subjective: I am doing good.   Cognition  Overall Cognitive Status: Appears within functional limits for tasks assessed/performed Arousal/Alertness: Awake/alert Orientation Level: Appears intact for tasks assessed Behavior During Session: [facility] for tasks performed    Balance     End of Session PT - End of Session Equipment Utilized During Treatment: Gait belt;Back brace Activity Tolerance: Patient tolerated treatment well Patient left: in chair;with call bell/phone within reach;with family/visitor present Nurse Communication: Mobility status   GP     Kharter Brew 05/13/2012, 1:56 PM

## 2012-05-13 NOTE — Progress Notes (Signed)
Patient ID: [name], male   DOB: September 20, 1941, 71 y.o.   MRN: [id number] Vital signs are stable. Patient feels much improved. Has been tolerating full liquid diet and is hungry.  Incision is clean and dry motor function is good. Abdomen is soft nontender.  Advance diet, discontinue IV, allow patient to shower. Hopeful for discharge soon. Physical therapy to reassess home needs

## 2012-05-13 NOTE — Progress Notes (Signed)
I have read and agree with note below.  Ivonne Andrew PT, DPT [phone]

## 2012-05-14 LAB — BASIC METABOLIC PANEL
BUN: 8 mg/dL (ref 6–23)
CO2: 22 mEq/L (ref 19–32)
Calcium: 9.2 mg/dL (ref 8.4–10.5)
Chloride: 105 mEq/L (ref 96–112)
Creatinine, Ser: 0.65 mg/dL (ref 0.50–1.35)
GFR calc Af Amer: >90 mL/min (ref >90)
GFR calc non Af Amer: >90 mL/min (ref >90)
Glucose, Bld: 196 mg/dL — ABNORMAL HIGH (ref 70–99)
Potassium: 3.7 mEq/L (ref 3.5–5.1)
Sodium: 139 mEq/L (ref 135–145)

## 2012-05-14 LAB — GLUCOSE, CAPILLARY: Glucose-Capillary: 162 mg/dL — ABNORMAL HIGH (ref 70–99)

## 2012-05-14 MED ORDER — HYDROCODONE-ACETAMINOPHEN 5-500 MG PO TABS: 1.0000 | ORAL_TABLET | ORAL | Status: AC | PRN

## 2012-05-14 MED ORDER — DIAZEPAM 5 MG PO TABS: 5.0000 mg | ORAL_TABLET | Freq: Four times a day (QID) | ORAL | Status: AC | PRN

## 2012-05-14 NOTE — Clinical Documentation Improvement (Signed)
Anemia Blood Loss Clarification  THIS DOCUMENT IS NOT A PERMANENT PART OF THE MEDICAL RECORD        05/14/12  Dear Dr.Tilley/[facility]er[facility]  In an effort to better capture your patient's severity of illness, reflect appropriate length of stay and utilization of resources, a review of the patient medical record has revealed the following indicators.   Based on your clinical judgment, please clarify and document in a progress note and/or discharge summary the clinical condition associated with the following supporting information: In responding to this query please exercise your independent judgment.  The fact that a query is asked, does not imply that any particular answer is desired or expected.   Thank you for capturing the patients "Blood Loss Anemia" in your progress notes. For added specificity, if possible, could you please add acuity to this diagnosis. Thank you!   Possible Clinical Conditions?  - Acute Blood Loss Anemia  - Other condition (please document in the progress notes and/or discharge summary)  - Cannot Clinically determine at this time      Reviewed:  no additional documentation provided   Thank You,  Saul Fordyce  Clinical Documentation Specialist: [phone] Pager  Health Information Management [place]  Cannot determine.

## 2012-05-14 NOTE — Care Management Note (Signed)
    Page 1 of 2   05/14/2012     3:39:30 PM   CARE MANAGEMENT NOTE 05/14/2012  Patient:  [name], [name]   Account Number:  [id number]  Date Initiated:  05/07/2012  Documentation initiated by:  [facility]  Subjective/Objective Assessment:   Admitted postop L3-5 PLIF. Lives with spouse.     Action/Plan:   PT eval-recommending HHPT  OT eval-recommending HHOT   Anticipated DC Date:  05/14/2012   Anticipated DC Plan:  HOME W HOME HEALTH SERVICES      DC Planning Services  CM consult      Choice offered to / List presented to:  C-1 Patient        HH arranged  HH-2 PT  HH-3 OT      [facility] agency  [facility]   Status of service:  Completed, signed off Medicare Important Message given?   (If response is "NO", the following Medicare IM given date fields will be blank) Date Medicare IM given:   Date Additional Medicare IM given:    Discharge Disposition:  HOME W HOME HEALTH SERVICES  Per UR Regulation:  Reviewed for med. necessity/level of care/duration of stay  If discussed at Long Length of Stay Meetings, dates discussed:    Comments:  05[facility]14/12 [facility] and informed her of orders for HHPT and OT and d/c today. Jacquelynn Cree RN, BSN, CCM   05/08/12 Patient has ileus and NGT to suction.Will continue to follow for d/c needs. Jacquelynn Cree RN, BSN, CCM  05/07/12 PT recommending HHPT. Spoke with patient and his wife on 05/06/12 about [facility] agencies. Patient chose [facility] from the [facility] list of [facility] agencies. Patient states that he has a rolling walker.Contacted Ellenora Talton at [facility] and requested HHPT and possibly HHOT, orders pending. Will continue to follow for d/c needs. Jacquelynn Cree RN, BSN, CCM

## 2012-05-14 NOTE — Progress Notes (Signed)
Much improved. Please call if we can help. Maintain cardiac meds as they were on admission.

## 2012-05-14 NOTE — Progress Notes (Signed)
Physical Therapy Treatment Patient Details Name: [name] MRN: [id number] DOB: [birth year] Today's Date: 05/14/2012 Time: 0865-7846 PT Time Calculation (min): 35 min  PT Assessment / Plan / Recommendation Comments on Treatment Session  Pt stated he feels ready to go home and after education, wife stated she feels comfortable too. Home health PT still appropriate.    Follow Up Recommendations  Home health PT    Barriers to Discharge        Equipment Recommendations  None recommended by PT    Recommendations for Other Services    Frequency Min 5X/week   Plan Discharge plan remains appropriate    Precautions / Restrictions Precautions Precautions: Back Precaution Comments: pt and wife both verbalized 3/3 back precautions. Required Braces or Orthoses: Spinal Brace Spinal Brace: Lumbar corset Restrictions Weight Bearing Restrictions: No       Mobility  Bed Mobility Bed Mobility: Rolling Left;Left Sidelying to Sit Rolling Right: 4: Min guard;5: Supervision (wife educated on bed mobility and gave min assist for left) Rolling Left: 4: Min assist;With rail (wife educated on bed mobility and gave min assist) Left Sidelying to Sit: 4: Min assist;With rails;5: Supervision (wife assisted pt, SPT supervision) Transfers Transfers: Sit to Stand;Stand to Sit Sit to Stand: 4: Min assist;With upper extremity assist;With armrests (pt used one hand pushing down on walker, one pushing off bed) Stand to Sit: 4: Min assist;To chair/3-in-1;With upper extremity assist Details for Transfer Assistance: Pt again used one hand pushing down on the walker and one on the chair/bed due to weak bil LE during sit ->stand Ambulation/Gait Ambulation/Gait Assistance: 5: Supervision Ambulation Distance (Feet): 125 Feet Assistive device: Rolling walker Ambulation/Gait Assistance Details: cues for foot placement in walker and for upright posture. wife educated on ssupervision while walking and proper walker  use. Gait Pattern: Trunk flexed;Narrow base of support Stairs: Yes Stairs Assistance: 4: Min guard Stairs Assistance Details (indicate cue type and reason): Pt ascend/descend stairs sideways with verbal cues for safety and foot placement. Wife was present and educated on safe guarding technique for stairs upon return home. Stair Management Technique: One rail Left;Sideways Number of Stairs: 6  Wheelchair Mobility Wheelchair Mobility: No     PT Goals Acute Rehab PT Goals PT Goal: Rolling Supine to Left Side - Progress: Progressing toward goal PT Goal: Supine/Side to Sit - Progress: Progressing toward goal PT Goal: Sit to Supine/Side - Progress: Progressing toward goal PT Goal: Sit to Stand - Progress: Progressing toward goal PT Goal: Stand to Sit - Progress: Progressing toward goal PT Goal: Ambulate - Progress: Progressing toward goal PT Goal: Up/Down Stairs - Progress: Progressing toward goal  Visit Information  Last PT Received On: 05/14/12 Assistance Needed: +1    Subjective Data  Subjective: I am not even awake yet.   Cognition  Overall Cognitive Status: Appears within functional limits for tasks assessed/performed Arousal/Alertness: Awake/alert (had just woken up upon presentation) Orientation Level: Appears intact for tasks assessed Behavior During Session: [facility] for tasks performed Cognition - Other Comments: groggy at first due to having just woken up but was fully awake and alert once OOB    Balance     End of Session PT - End of Session Equipment Utilized During Treatment: Gait belt;Back brace Activity Tolerance: Patient tolerated treatment well Patient left: in chair;with call bell/phone within reach;with family/visitor present   GP     Sonya Pucci 05/14/2012, 10:04 AM

## 2012-05-14 NOTE — Progress Notes (Signed)
I have read and agree with below note. Venisa Frampton Helen Whitlow PT, DPT [phone] 

## 2012-05-14 NOTE — Progress Notes (Signed)
Occupational Therapy Treatment Patient Details Name: [name] MRN: [id number] DOB: 21-Jan-1941 Today's Date: 05/14/2012 Time: 1017-1040 OT Time Calculation (min): 23 min  OT Assessment / Plan / Recommendation Comments on Treatment Session Pt making good progress    Follow Up Recommendations  Home health OT;Supervision/Assistance - 24 hour    Barriers to Discharge       Equipment Recommendations  None recommended by PT;None recommended by OT    Recommendations for Other Services    Frequency     Plan Discharge plan remains appropriate    Precautions / Restrictions Precautions Precautions: Back Precaution Comments: pt and wife both verbalized 3/3 back precautions. Required Braces or Orthoses: Spinal Brace Spinal Brace: Lumbar corset Restrictions Weight Bearing Restrictions: No   Pertinent Vitals/Pain Pt reports 2/10 back pain; pre-medicated    ADL  Upper Body Dressing: Performed;Set up Where Assessed - Upper Body Dressing: Unsupported sitting Lower Body Dressing: Performed;Set up;Supervision/safety Where Assessed - Lower Body Dressing: Unsupported sit to stand (pt used reacher) Tub/Shower Transfer: Performed;Minimal assistance Tub/Shower Transfer Method: Ambulating (backward entry) Equipment Used: Back brace;Gait belt;Rolling walker Transfers/Ambulation Related to ADLs: Supervision with RW throughout room ADL Comments: Pt and wife able to verbalize and demonstrate safe shower transfer with backward entry. Wife will be able to provide necessary supervision/assist.     OT Diagnosis:    OT Problem List:   OT Treatment Interventions:     OT Goals ADL Goals ADL Goal: Upper Body Dressing - Progress: Met ADL Goal: Lower Body Dressing - Progress: Met ADL Goal: Tub/Shower Transfer - Progress: Met ADL Goal: Additional Goal #1 - Progress: Met  Visit Information  Last OT Received On: 05/14/12 Assistance Needed: +1    Subjective Data      Prior Functioning         Cognition  Overall Cognitive Status: Appears within functional limits for tasks assessed/performed    Mobility Bed Mobility Rolling Right: 4: Min guard;5: Supervision Right Sidelying to Sit: 4: Min guard;HOB flat Sit to Sidelying Right: 4: Min guard;HOB flat Transfers Sit to Stand: From bed;With upper extremity assist;4: Min assist Stand to Sit: 4: Min guard;To bed   Exercises    Balance    End of Session OT - End of Session Equipment Utilized During Treatment: Gait belt;Back brace Activity Tolerance: Patient tolerated treatment well Patient left: in bed;with call bell/phone within reach;with family/visitor present Nurse Communication: Mobility status;Patient requests pain meds  GO     [name] 05/14/2012, 3:39 PM

## 2012-05-14 NOTE — Discharge Summary (Signed)
Physician Discharge Summary  Patient ID: [name] MRN: [id number] DOB/AGE: 73/07/1941 73 y.o.  Admit date: 05/05/2012 Discharge date: 05/14/2012  Admission Diagnoses: Lumbar spinal stenosis, neurogenic claudication,  Discharge Diagnoses: Lumbar spinal stenosis, neurogenic claudication, postoperative paralytic ileus, congestive heart failure, urinary retention Active Problems:  Abdominal pain, generalized  Paralytic ileus   Discharged Condition: fair  Hospital Course: Patient was admitted to undergo surgical decompression arthrodesis from L3-L5. He tolerated the surgery well however during the postoperative phase he was noted to have developed a paralytic ileus and urinary retention. This persisted for the first 4 days postoperatively he was treated with nasogastric tube and IV fluid hydration he also had tachycardia and hypotension relative to his postoperative state and is found to have some congestive heart failure is seen by Dr. Garnette Scheuermann from cardiology and also by Dr. Claudette Head from gastroc and neurology. With supportive care his ileus gradually resolved and on the seventh hospital day he was able to start taking a clear liquid diet he improved rapidly and at this time is discharged home with his incision being clean and dry much less back pain and he experiencing initially postoperatively is ambulatory although weak and he has plans to undergo home health physical therapy and occupational therapy. Urinary retention was treated with prolonged use of a Foley catheter while the patient was non-ambulatory this has resolved completely.  Consults: cardiology and GI  Significant Diagnostic Studies: None  Treatments: surgery: Lumbar laminectomy and decompression L3-L5 posterior interbody arthrodesis with peek spacers posterior lateral arthrodesis from L3-L5 with pedicle screws fixation  Discharge Exam: Blood pressure 130/66, pulse 78, temperature 98.6 F (37 C), temperature source Oral,  resp. rate 18, height 6\' 1"  (1.854 m), weight 96.48 kg (212 lb 11.2 oz), SpO2 98.00%. Incision is clean and dry motor function is good in lower extremities with normal station and gait  Disposition: 01-Home or Self Care  Discharge Orders    Future Orders Please Complete By Expires   Ambulatory referral to Physical Therapy      Ambulatory referral to Occupational Therapy      Diet - low sodium heart healthy      Increase activity slowly      Discharge instructions      Comments:   Okay to shower. Do not apply salves or appointments to incision. No heavy lifting with the upper extremities greater than 15 pounds. May resume driving when not requiring pain medication and patient feels comfortable with doing so.   Call MD for:  redness, tenderness, or signs of infection (pain, swelling, redness, odor or green/yellow discharge around incision site)      Call MD for:  severe uncontrolled pain      Call MD for:  temperature >100.4        Medication List  As of 05/14/2012  2:07 PM   TAKE these medications         aspirin EC 81 MG tablet   Take 81 mg by mouth daily.      atorvastatin 10 MG tablet   Commonly known as: LIPITOR   Take 10 mg by mouth daily.      BUTRANS 10 MCG/HR Ptwk   Generic drug: buprenorphine   Place 1 patch onto the skin every 7 (seven) days. Thursdays      CoQ-10 100 MG Caps   Take 3 capsules by mouth daily.      diazepam 5 MG tablet   Commonly known as: VALIUM   Take 1  tablet (5 mg total) by mouth every 6 (six) hours as needed (Muscle spasms).      finasteride 1 MG tablet   Commonly known as: PROPECIA   Take by mouth daily.      HYDROcodone-acetaminophen 5-500 MG per tablet   Commonly known as: VICODIN   Take 1-2 tablets by mouth every 4 (four) hours as needed for pain.      metFORMIN 500 MG 24 hr tablet   Commonly known as: GLUCOPHAGE-XR   Take 500 mg by mouth 2 (two) times daily with breakfast and lunch.      metoprolol tartrate 25 MG tablet   Commonly  known as: LOPRESSOR   Take 25 mg by mouth 2 (two) times daily.      multivitamin with minerals Tabs   Take 1 tablet by mouth daily.      pantoprazole 40 MG tablet   Commonly known as: PROTONIX   Take 40 mg by mouth daily.      ramipril 10 MG capsule   Commonly known as: ALTACE   Take 10 mg by mouth daily.      traMADol 50 MG tablet   Commonly known as: ULTRAM   Take 50 mg by mouth every 6 (six) hours as needed. For pain      vitamin B-12 1000 MCG tablet   Commonly known as: CYANOCOBALAMIN   Take 1,000 mcg by mouth every other day.             SignedStefani Dama 05/14/2012, 2:07 PM

## 2012-05-15 LAB — GLUCOSE, CAPILLARY: Glucose-Capillary: 126 mg/dL — ABNORMAL HIGH (ref 70–99)

## 2012-07-01 IMAGING — CR DG CHEST 2V
2 series · 2 of 2 positions shown · non-contrast
Comparison: 09/16/2009.

CLINICAL DATA: Chest pain.  Chest tightness.  Short of breath since
spinal injection 5 days ago.

CHEST - 2 VIEW

[w chest pa]
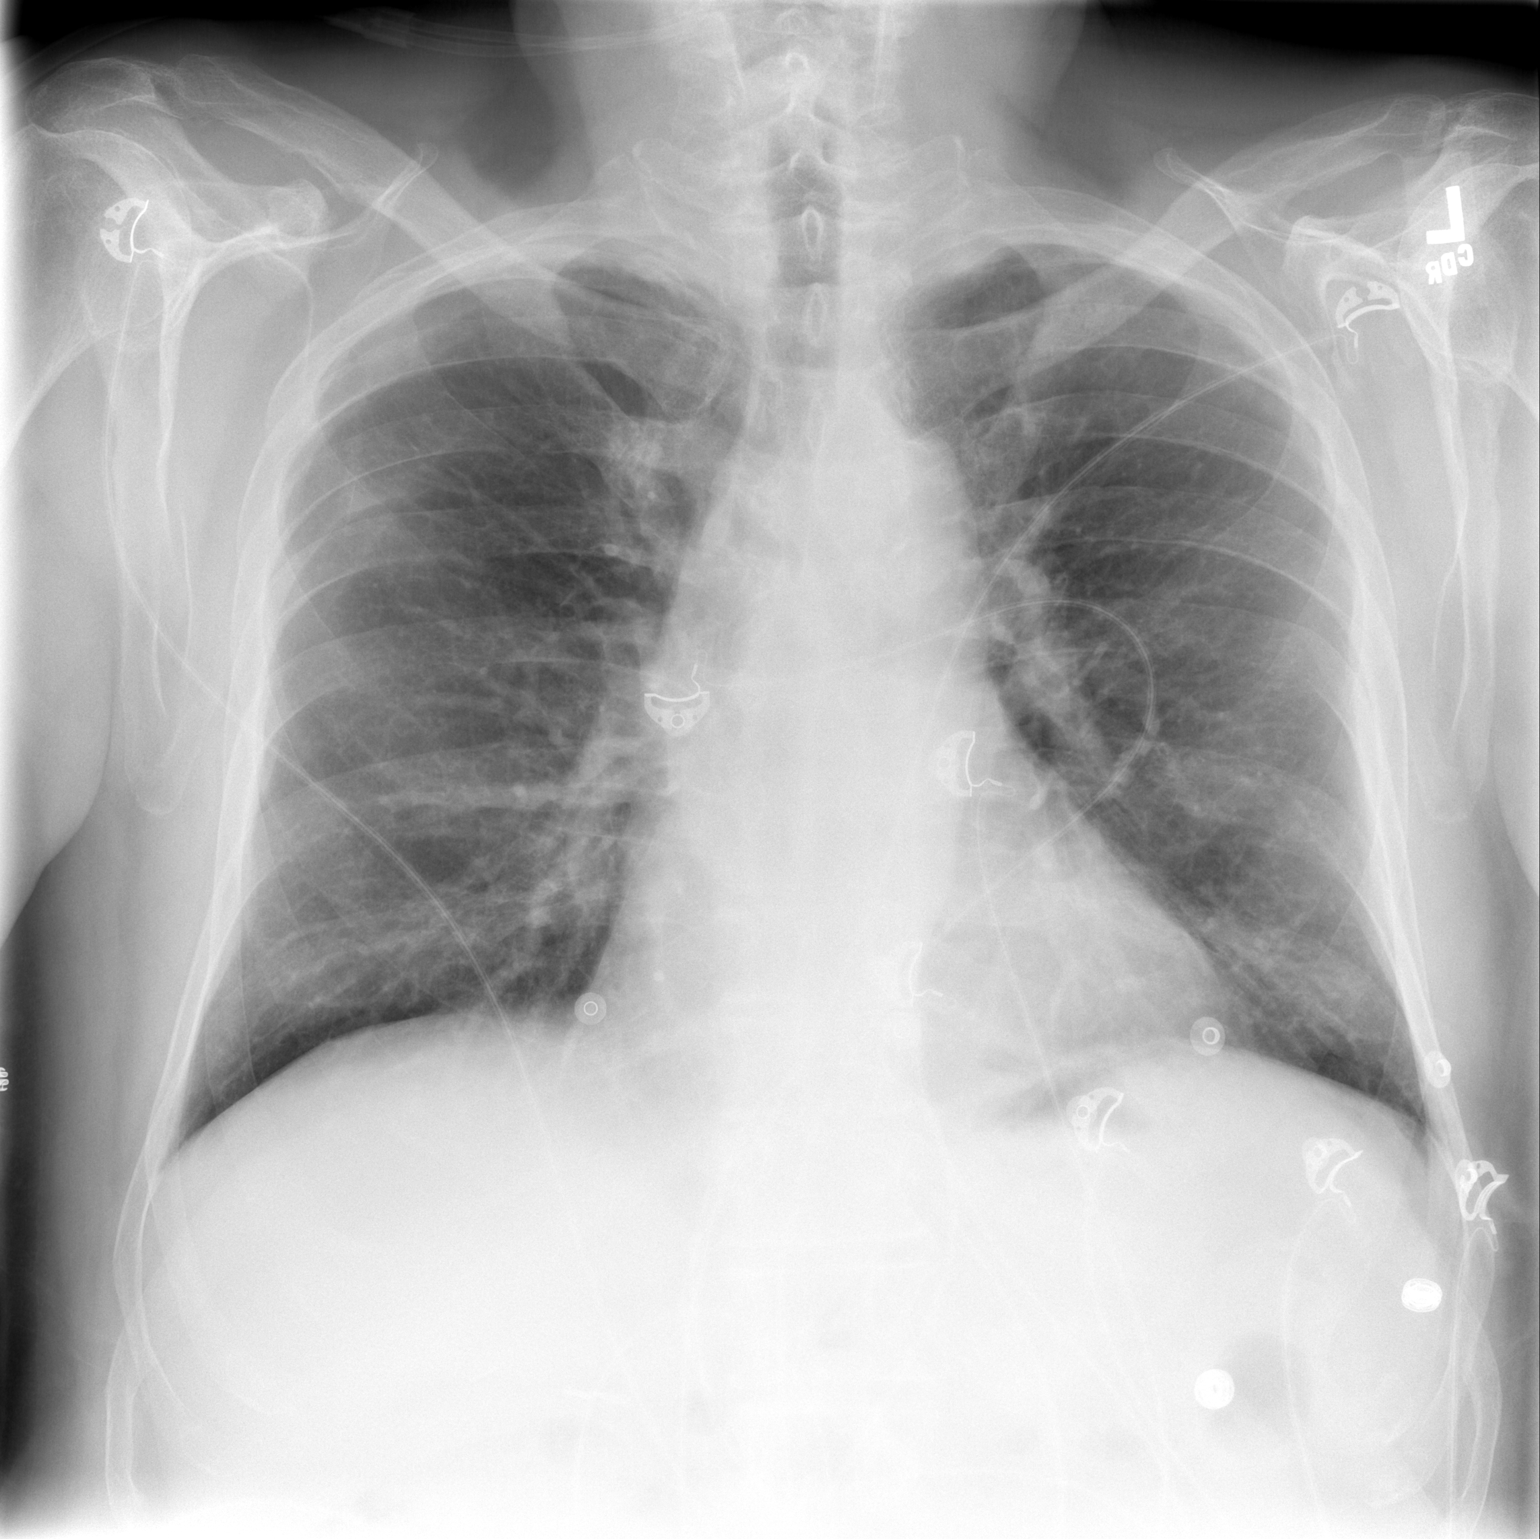

[w chest lat]
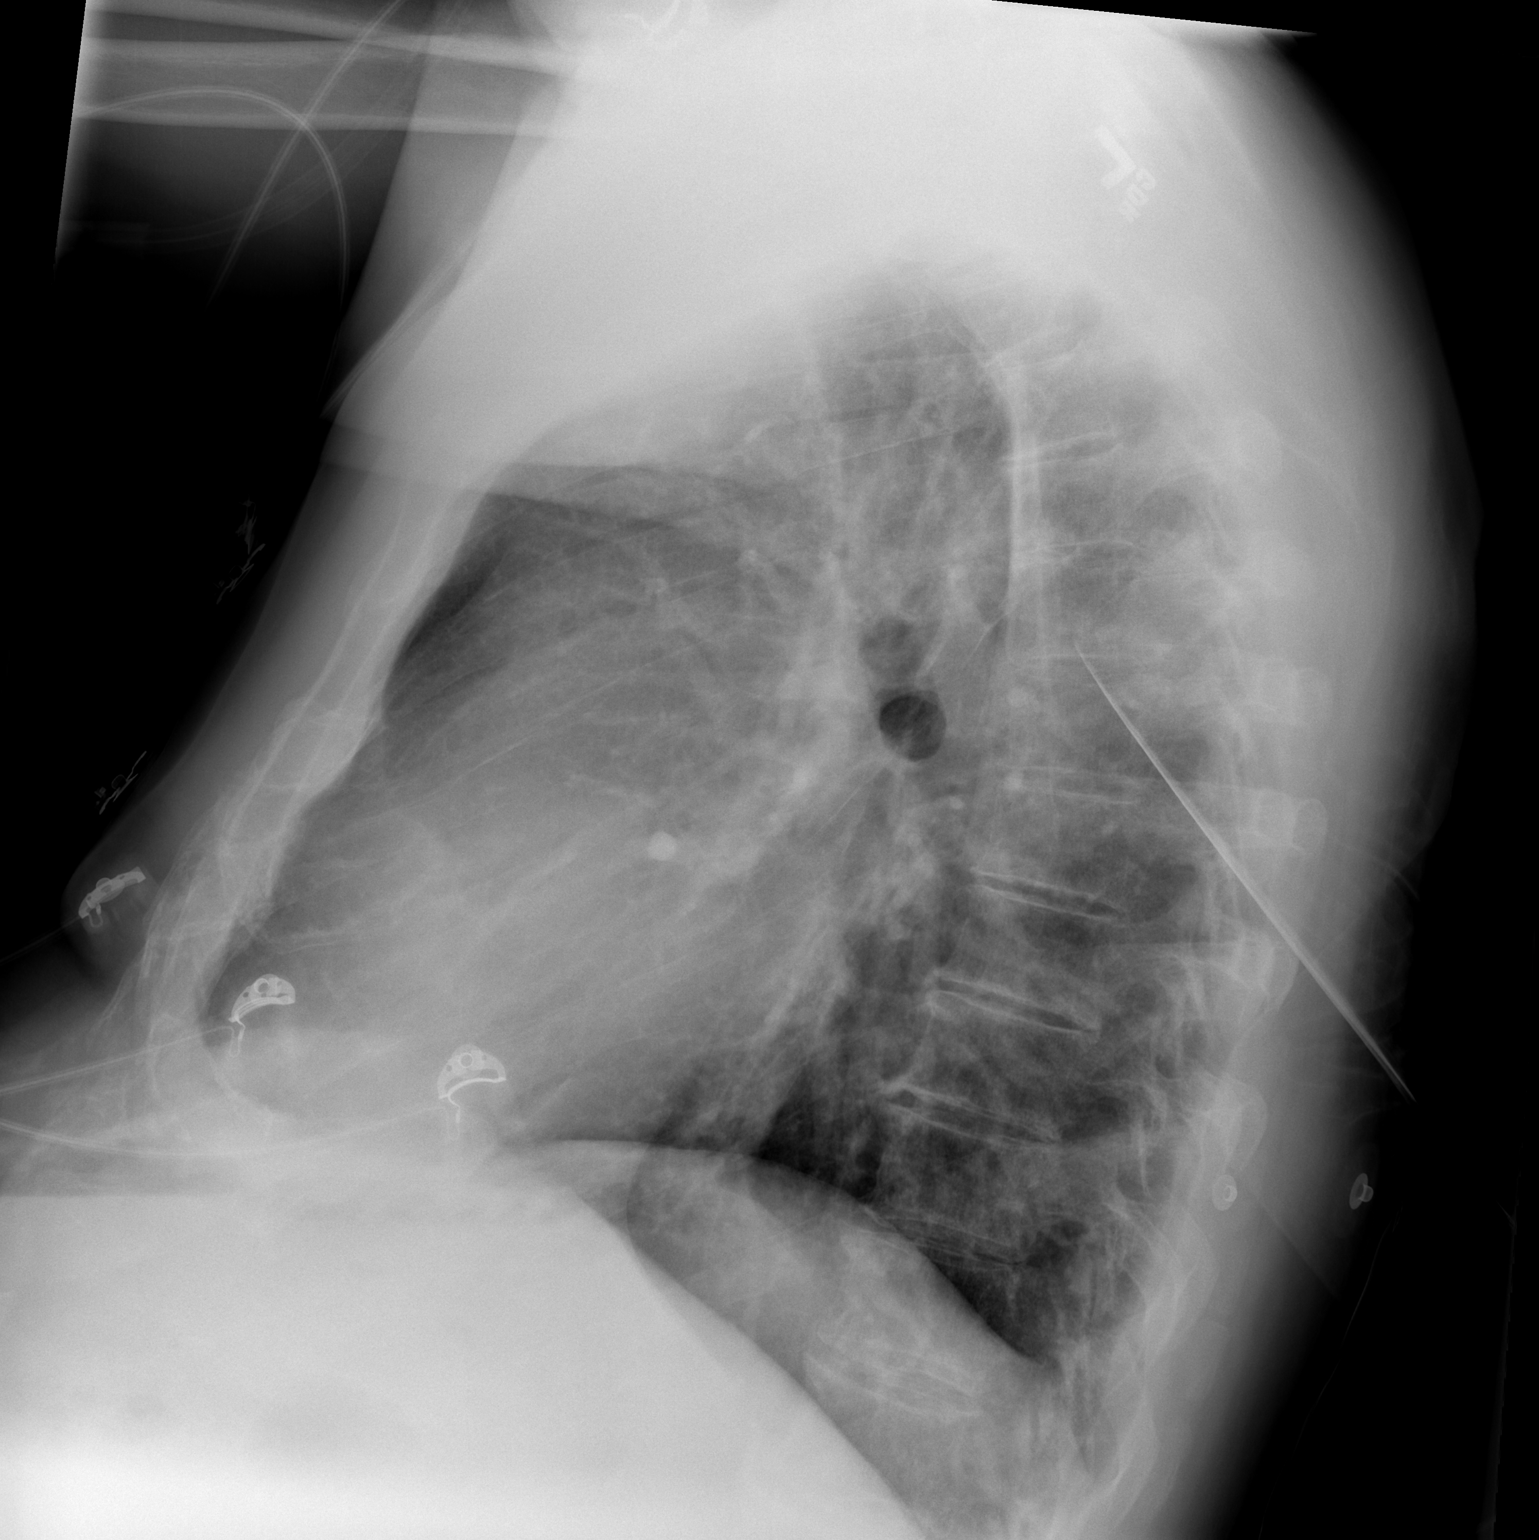

[2 of 2 positions shown; findings below may reference images not displayed]

FINDINGS: There is a linear density posterior to the heart shadow
on the lateral view.  On the frontal view, this is in the
retrocardiac and left basilar region adjacent to the cardiac apex.
The appearance is most compatible with subsegmental atelectasis.
Infection is considered less likely.  There is no pericardial
effusion.  Monitoring leads are projected over the chest.  The
cardiopericardial silhouette appears within normal limits.  Trachea
midline.
IMPRESSION: Anterior left lower lobe linear density is favored to represent
atelectasis over airspace disease/pneumonia.  This appears new
compared to prior examination.

## 2012-07-03 IMAGING — CT CT ANGIO CHEST
2 of 6 series · 18 of 36 positions shown · IV contrast (APPLIED)
Comparison: CT chest 12/04/2007 and 10/07/2002.

CLINICAL DATA: Chest pain.  Shortness of breath.  Elevated D-
dimer.  Unremarkable cardiac catheterization yesterday.

CT ANGIOGRAPHY CHEST WITH CONTRAST 11/27/2010:
TECHNIQUE: Multidetector CT imaging of the chest was performed
using the standard protocol during bolus administration of
intravenous contrast.  Multiplanar CT image reconstructions
including MIPs were obtained to evaluate the vascular anatomy.
Contrast:  100 ml [id number] IV.

[Series 8: pulm embolism 1.0 b25f thins · axial · 0.86mm/px · z∈[+1188,+1457]mm · 17 of 299 slices shown]
[im 15/299  lung]
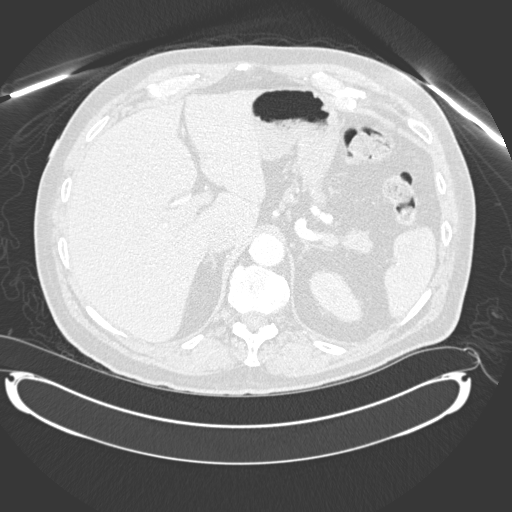
[im 30/299  mediastinal]
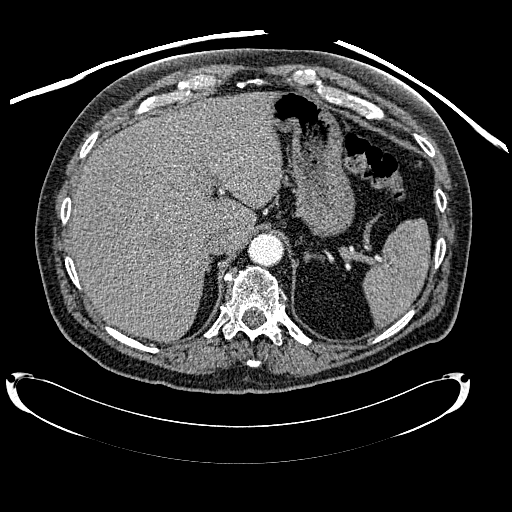
[im 45/299  lung]
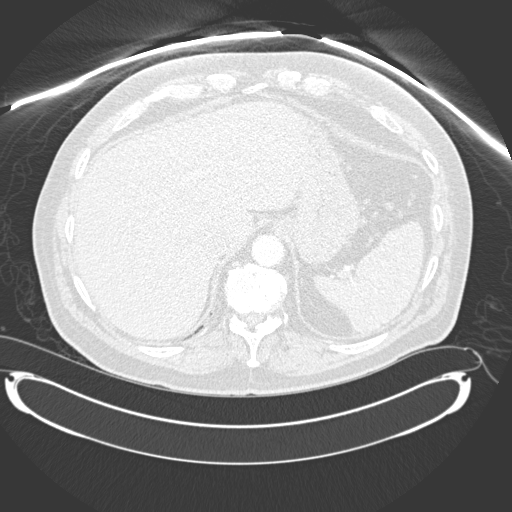
[im 60/299  mediastinal]
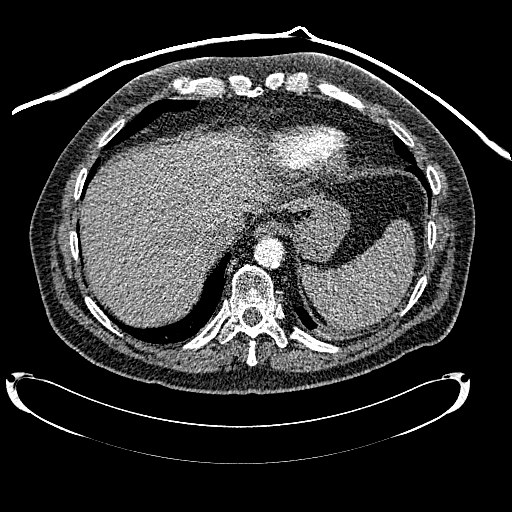
[im 90/299  lung]
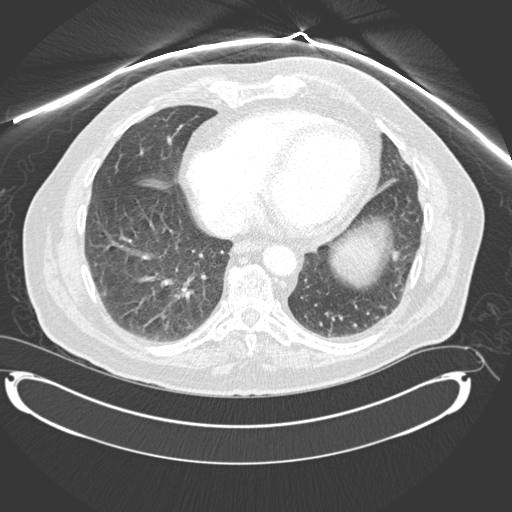
[im 105/299  mediastinal]
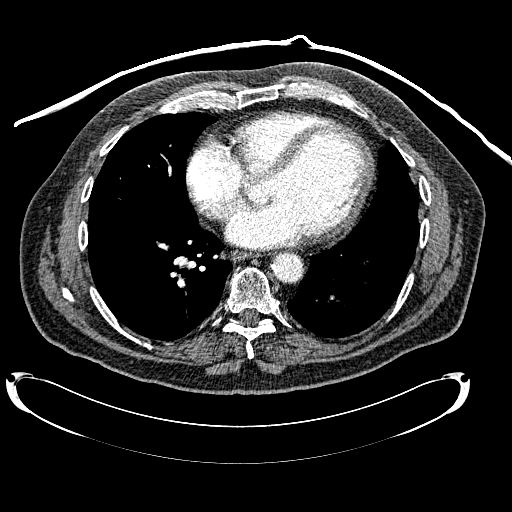
[im 120/299  lung]
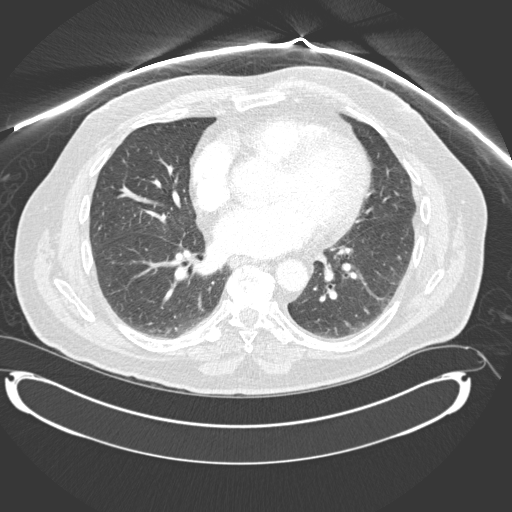
[im 135/299  mediastinal]
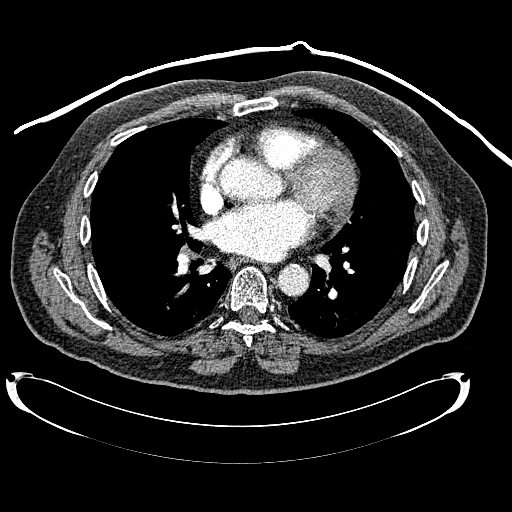
[im 150/299  lung]
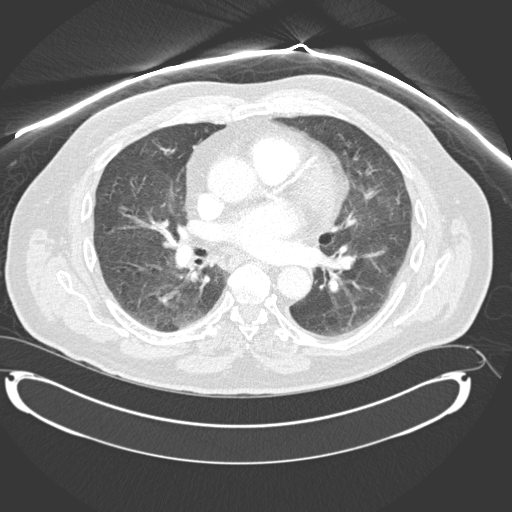
[im 164/299  mediastinal]
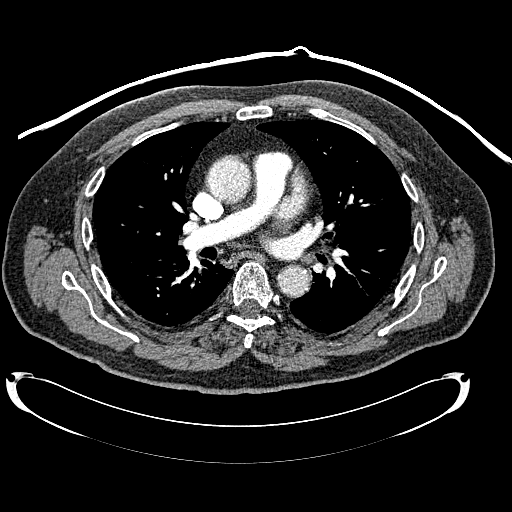
[im 179/299  lung]
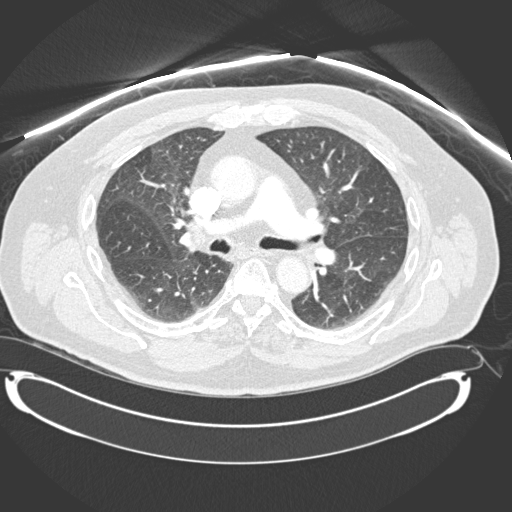
[im 194/299  mediastinal]
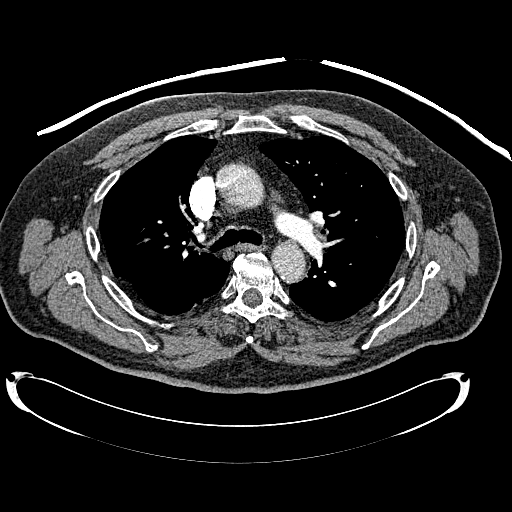
[im 209/299  lung]
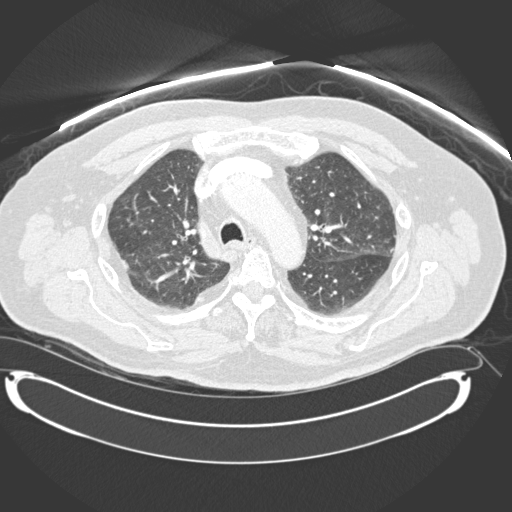
[im 239/299  mediastinal]
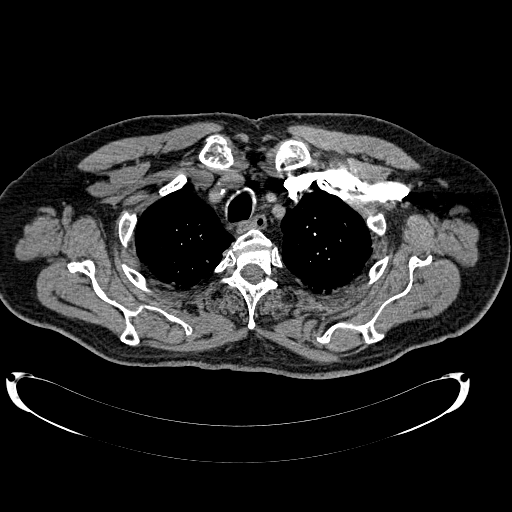
[im 254/299  lung]
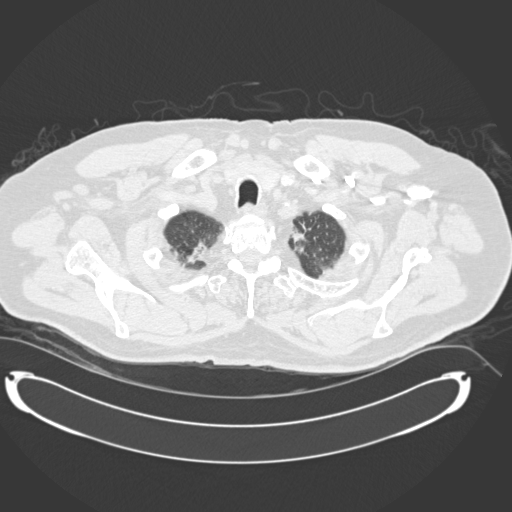
[im 269/299  mediastinal]
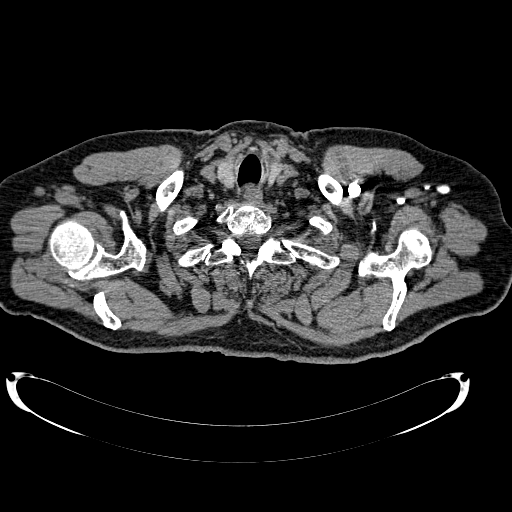
[im 284/299  lung]
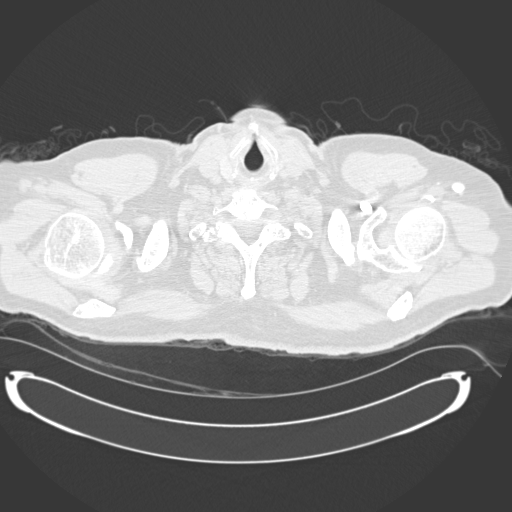

[Series 604: coronal · coronal · 0.86mm/px · 1 of 86 slices shown]
[im 43/86  mediastinal]
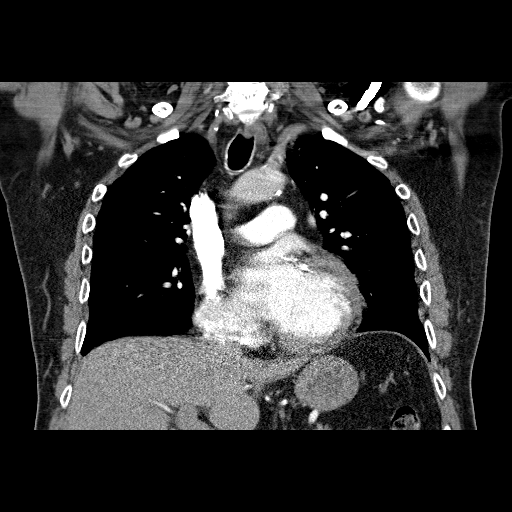

[18 of 36 positions shown; findings below may reference images not displayed]

FINDINGS: Contrast opacification of the pulmonary arteries is
excellent.  Respiratory  motion blurred several of the images,
particularly of the mid lungs and the bases.  Overall, the study is
of good diagnostic quality.

No filling defects within either main pulmonary artery or their
branches in either lung to suggest pulmonary embolism.  Heart
mildly to moderately enlarged with left ventricular predominance,
unchanged.  Extensive three-vessel coronary artery calcification.
No pericardial effusion.  Moderate atherosclerosis involving the
thoracic and upper abdominal aorta without aneurysm or dissection.
Proximal great vessels atherosclerotic but widely patent.

Numerous areas of geographic hyperlucency throughout both lungs.
Minimal scarring deep in the left lower lobe.  No confluent
airspace consolidation.  No pleural effusions.  Biapical
pleuroparenchymal scarring.  No pulmonary parenchymal nodules or
masses.  Calcified pleural plaque posteriorly in the medial right
mid chest at the level of the carina.

No significant mediastinal, hilar, or axillary lymphadenopathy.
Thyroid gland unremarkable.  Visualized upper abdomen unremarkable
for the early phase of enhancement.  Bone window images demonstrate
diffuse thoracic spondylosis.

Review of the MIP images confirms the above findings.
IMPRESSION: 1.  No evidence of pulmonary embolism.
2.  Stable cardiomegaly.  Localized air trapping throughout both
lungs consistent with asthma and/or bronchitis.  No acute
cardiopulmonary disease otherwise.
3.  Solitary calcified pleural plaque posteromedially in the right
mid chest at the level of the carina.

## 2012-09-18 ENCOUNTER — Other Ambulatory Visit: Payer: Self-pay | Admitting: Neurological Surgery

## 2012-09-18 DIAGNOSIS — M47816 Spondylosis without myelopathy or radiculopathy, lumbar region: Secondary | ICD-10-CM

## 2012-09-22 ENCOUNTER — Ambulatory Visit
Admission: RE | Admit: 2012-09-22 | Discharge: 2012-09-22 | Disposition: A | Discharge status: Home / Self Care | Payer: Medicare Other | Source: Ambulatory Visit | Attending: Neurological Surgery | Admitting: Neurological Surgery

## 2012-09-22 DIAGNOSIS — M47816 Spondylosis without myelopathy or radiculopathy, lumbar region: Secondary | ICD-10-CM

## 2012-09-30 ENCOUNTER — Emergency Department (HOSPITAL_COMMUNITY)
Admission: EM | Admit: 2012-09-30 | Discharge: 2012-09-30 | Disposition: A | Discharge status: Home / Self Care | Payer: Medicare Other | Attending: Emergency Medicine | Admitting: Emergency Medicine

## 2012-09-30 ENCOUNTER — Emergency Department (HOSPITAL_COMMUNITY): Payer: Medicare Other

## 2012-09-30 ENCOUNTER — Encounter (HOSPITAL_COMMUNITY): Payer: Self-pay | Admitting: Cardiology

## 2012-09-30 DIAGNOSIS — Z7982 Long term (current) use of aspirin: Secondary | ICD-10-CM | POA: Insufficient documentation

## 2012-09-30 DIAGNOSIS — R110 Nausea: Secondary | ICD-10-CM | POA: Insufficient documentation

## 2012-09-30 DIAGNOSIS — G4730 Sleep apnea, unspecified: Secondary | ICD-10-CM | POA: Insufficient documentation

## 2012-09-30 DIAGNOSIS — B9789 Other viral agents as the cause of diseases classified elsewhere: Secondary | ICD-10-CM | POA: Insufficient documentation

## 2012-09-30 DIAGNOSIS — I499 Cardiac arrhythmia, unspecified: Secondary | ICD-10-CM | POA: Insufficient documentation

## 2012-09-30 DIAGNOSIS — E785 Hyperlipidemia, unspecified: Secondary | ICD-10-CM | POA: Insufficient documentation

## 2012-09-30 DIAGNOSIS — B349 Viral infection, unspecified: Secondary | ICD-10-CM

## 2012-09-30 DIAGNOSIS — I252 Old myocardial infarction: Secondary | ICD-10-CM | POA: Insufficient documentation

## 2012-09-30 DIAGNOSIS — I2510 Atherosclerotic heart disease of native coronary artery without angina pectoris: Secondary | ICD-10-CM | POA: Insufficient documentation

## 2012-09-30 DIAGNOSIS — Z79899 Other long term (current) drug therapy: Secondary | ICD-10-CM | POA: Insufficient documentation

## 2012-09-30 DIAGNOSIS — K219 Gastro-esophageal reflux disease without esophagitis: Secondary | ICD-10-CM | POA: Insufficient documentation

## 2012-09-30 DIAGNOSIS — Z8709 Personal history of other diseases of the respiratory system: Secondary | ICD-10-CM | POA: Insufficient documentation

## 2012-09-30 DIAGNOSIS — E119 Type 2 diabetes mellitus without complications: Secondary | ICD-10-CM | POA: Insufficient documentation

## 2012-09-30 DIAGNOSIS — Z87891 Personal history of nicotine dependence: Secondary | ICD-10-CM | POA: Insufficient documentation

## 2012-09-30 DIAGNOSIS — I10 Essential (primary) hypertension: Secondary | ICD-10-CM | POA: Insufficient documentation

## 2012-09-30 DIAGNOSIS — Z8739 Personal history of other diseases of the musculoskeletal system and connective tissue: Secondary | ICD-10-CM | POA: Insufficient documentation

## 2012-09-30 LAB — COMPREHENSIVE METABOLIC PANEL
ALT: 25 U/L (ref 0–53)
AST: 24 U/L (ref 0–37)
Albumin: 3.8 g/dL (ref 3.5–5.2)
Alkaline Phosphatase: 76 U/L (ref 39–117)
BUN: 10 mg/dL (ref 6–23)
CO2: 25 mEq/L (ref 19–32)
Calcium: 9.5 mg/dL (ref 8.4–10.5)
Chloride: 96 mEq/L (ref 96–112)
Creatinine, Ser: 0.71 mg/dL (ref 0.50–1.35)
GFR calc Af Amer: >90 mL/min (ref >90)
GFR calc non Af Amer: >90 mL/min (ref >90)
Glucose, Bld: 266 mg/dL — ABNORMAL HIGH (ref 70–99)
Potassium: 4.2 mEq/L (ref 3.5–5.1)
Sodium: 131 mEq/L — ABNORMAL LOW (ref 135–145)
Total Bilirubin: 0.5 mg/dL (ref 0.3–1.2)
Total Protein: 7 g/dL (ref 6.0–8.3)

## 2012-09-30 LAB — CBC WITH DIFFERENTIAL/PLATELET
Basophils Absolute: 0 10*3/uL (ref 0.0–0.1)
Basophils Relative: 0 % (ref 0–1)
Eosinophils Absolute: 0 10*3/uL (ref 0.0–0.7)
Eosinophils Relative: 0 % (ref 0–5)
HCT: 38.3 % — ABNORMAL LOW (ref 39.0–52.0)
Hemoglobin: 13.8 g/dL (ref 13.0–17.0)
Lymphocytes Relative: 7 % — ABNORMAL LOW (ref 12–46)
Lymphs Abs: 0.4 10*3/uL — ABNORMAL LOW (ref 0.7–4.0)
MCH: 31.7 pg (ref 26.0–34.0)
MCHC: 36 g/dL (ref 30.0–36.0)
MCV: 87.8 fL (ref 78.0–100.0)
Monocytes Absolute: 0.5 10*3/uL (ref 0.1–1.0)
Monocytes Relative: 8 % (ref 3–12)
Neutro Abs: 5.5 10*3/uL (ref 1.7–7.7)
Neutrophils Relative %: 85 % — ABNORMAL HIGH (ref 43–77)
Platelets: 123 10*3/uL — ABNORMAL LOW (ref 150–400)
RBC: 4.36 MIL/uL (ref 4.22–5.81)
RDW: 13.1 % (ref 11.5–15.5)
WBC: 6.5 10*3/uL (ref 4.0–10.5)

## 2012-09-30 LAB — URINALYSIS, ROUTINE W REFLEX MICROSCOPIC
Bilirubin Urine: NEGATIVE
Glucose, UA: NEGATIVE mg/dL
Ketones, ur: NEGATIVE mg/dL
Leukocytes, UA: NEGATIVE
Nitrite: NEGATIVE
Protein, ur: NEGATIVE mg/dL
Specific Gravity, Urine: 1.016 (ref 1.005–1.030)
Urobilinogen, UA: 1 mg/dL (ref 0.0–1.0)
pH: 7.5 (ref 5.0–8.0)

## 2012-09-30 LAB — POCT I-STAT TROPONIN I: Troponin i, poc: 0 ng/mL (ref 0.00–0.08)

## 2012-09-30 LAB — URINE MICROSCOPIC-ADD ON

## 2012-09-30 LAB — LACTIC ACID, PLASMA: Lactic Acid, Venous: 2.6 mmol/L — ABNORMAL HIGH (ref 0.5–2.2)

## 2012-09-30 MED ORDER — SODIUM CHLORIDE 0.9 % IV BOLUS (SEPSIS)
500.0000 mL | Freq: Once | INTRAVENOUS | Status: AC
  Administered 2012-09-30: 500 mL via INTRAVENOUS

## 2012-09-30 MED ORDER — ACETAMINOPHEN 325 MG PO TABS
650.0000 mg | ORAL_TABLET | Freq: Once | ORAL | Status: AC
  Administered 2012-09-30: 650 mg via ORAL
  Filled 2012-09-30: qty 2

## 2012-09-30 MED ORDER — SODIUM CHLORIDE 0.9 % IV SOLN
INTRAVENOUS | Status: DC
  Administered 2012-09-30: 125 mL/h via INTRAVENOUS

## 2012-09-30 NOTE — ED Notes (Signed)
Pt brought in from EMS. Pt alert and oriented.   Per ems pt c/o cp, nausea, and fever.  Pt also c/o pain in right arm and right hip.  Pthad back surgery in July, 2013.  EMS gave zofran 4 mg ivp enroute.

## 2012-09-30 NOTE — ED Notes (Signed)
Transported to xray 

## 2012-09-30 NOTE — ED Notes (Signed)
Pt eating meal tray 

## 2012-09-30 NOTE — ED Provider Notes (Signed)
History     CSN: [id number]  Arrival date & time 09/30/12  4098   First MD Initiated Contact with Patient 09/30/12 [phone]      Chief Complaint  Patient presents with  . Chest Pain  . Nausea    (Consider location/radiation/quality/duration/timing/severity/associated sxs/prior treatment) Patient is a 71 y.o. male presenting with chest pain. The history is provided by the patient.  Chest Pain   pt with sx of fever, nausea, and pain in arms and shoulders x 24hr--denies chest pain/pressure--no productive cough or dyspnea, no leg swelling--pain in arms is positional--ems called and pt given zofran and feels better--no rashes, denies urinary sx,-also c/o le pain since prior spine surgery in July--  Past Medical History  Diagnosis Date  . Hypertension   . Hypercholesteremia   . AAA (abdominal aortic aneurysm)   . Pain     back pain chronic- seen at pain clinic  . Acid reflux   . Neuropathy     in legs  . MI (myocardial infarction)     1992 and 1997 sees Dr. Mendel Ryder as needed   . Bronchitis     hx of  . Sleep apnea     uses cpap   . Diabetes mellitus     oral medication  . Complication of anesthesia     -years ago hair fell out.  [name] Dysrhythmia     "got out of rhythym a couple , but it straightened back out."  . Ileus   . Arthritis     BacK  . Incisional hernia     "small, from AAA"  . Coronary atherosclerosis of native coronary artery     ejection fraction of 40%, prior inferior myocardial infarction in February 1993, PTCA, LAD rotational atherectomy in August of 1998, acute inferolateral myocardial infarction, ventricular fibrillation in July of 1999, PTCA and stent of obtuse marginal 1, 100% RCA 1/12 with collaterals from patent circumflex and LAD    Past Surgical History  Procedure Date  . Abdominal surgery   . Abdominal aortic aneurysm repair   . Colonoscopy   . Cardiac catheterization   . Cholecystectomy   . Back surgery 2013    Miminal Invasive x2 in [place].     Family History  Problem Relation Age of Onset  . Diabetes Mother   . Coronary artery disease Mother   . Diabetes Father   . Arthritis Father   . Rheum arthritis Father   . Coronary artery disease Father     History  Substance Use Topics  . Smoking status: Former Smoker    Quit date: 01/01/1992  . Smokeless tobacco: Never Used  . Alcohol Use: Yes     Comment: occasional      Review of Systems  Cardiovascular: Positive for chest pain.  All other systems reviewed and are negative.    Allergies  Cymbalta; Gabapentin; and Lyrica  Home Medications   Current Outpatient Rx  Name  Route  Sig  Dispense  Refill  . AMOXICILLIN 500 MG PO CAPS               . ASPIRIN EC 81 MG PO TBEC   Oral   Take 81 mg by mouth daily.         . ATORVASTATIN CALCIUM 10 MG PO TABS   Oral   Take 10 mg by mouth daily.         [name] BUPRENORPHINE 10 MCG/HR TD PTWK   Transdermal   Place 1 patch  onto the skin every 7 (seven) days. Thursdays         . COQ-10 100 MG PO CAPS   Oral   Take 3 capsules by mouth daily.          [name] FINASTERIDE 1 MG PO TABS   Oral   Take by mouth daily.         . FUROSEMIDE 40 MG PO TABS               . HYDROCODONE-ACETAMINOPHEN 5-500 MG PO TABS               . KLOR-CON M20 20 MEQ PO TBCR               . METFORMIN HCL ER 500 MG PO TB24   Oral   Take 500 mg by mouth 2 (two) times daily with breakfast and lunch.         . METOPROLOL TARTRATE 25 MG PO TABS   Oral   Take 25 mg by mouth 2 (two) times daily.         . ADULT MULTIVITAMIN W/MINERALS CH   Oral   Take 1 tablet by mouth daily.         [name] OMEPRAZOLE 20 MG PO CPDR               . PANTOPRAZOLE SODIUM 40 MG PO TBEC   Oral   Take 40 mg by mouth daily.          [name] RAMIPRIL 10 MG PO CAPS   Oral   Take 10 mg by mouth daily.         . TRAMADOL HCL 50 MG PO TABS   Oral   Take 50 mg by mouth every 6 (six) hours as needed. For pain         . VITAMIN B-12  1000 MCG PO TABS   Oral   Take 1,000 mcg by mouth every other day.           BP 129/65  Pulse 112  Temp 99.8 F (37.7 C) (Oral)  Resp 20  Ht 6\' 1"  (1.854 m)  Wt 204 lb (92.534 kg)  BMI 26.91 kg/m2  SpO2 97%  Physical Exam  Nursing note and vitals reviewed. Constitutional: He is oriented to person, place, and time. He appears well-developed and well-nourished.  Non-toxic appearance. No distress.  HENT:  Head: Normocephalic and atraumatic.  Eyes: Conjunctivae normal, EOM and lids are normal. Pupils are equal, round, and reactive to light.  Neck: Normal range of motion. Neck supple. No tracheal deviation present. No mass present.  Cardiovascular: Regular rhythm and normal heart sounds.  Tachycardia present.  Exam reveals no gallop.   No murmur heard. Pulmonary/Chest: Effort normal and breath sounds normal. No stridor. No respiratory distress. He has no decreased breath sounds. He has no wheezes. He has no rhonchi. He has no rales.  Abdominal: Soft. Normal appearance and bowel sounds are normal. He exhibits no distension. There is no tenderness. There is no rebound and no CVA tenderness.  Musculoskeletal: Normal range of motion. He exhibits no edema and no tenderness.  Neurological: He is alert and oriented to person, place, and time. He has normal strength. No cranial nerve deficit or sensory deficit. GCS eye subscore is 4. GCS verbal subscore is 5. GCS motor subscore is 6.  Skin: Skin is warm and dry. No abrasion and no rash noted.  Psychiatric: He has a normal mood and affect.  His speech is normal and behavior is normal.    ED Course  Procedures (including critical care time)   Labs Reviewed  CBC WITH DIFFERENTIAL  COMPREHENSIVE METABOLIC PANEL  URINALYSIS, ROUTINE W REFLEX MICROSCOPIC  URINE CULTURE  LACTIC ACID, PLASMA  CULTURE, BLOOD (ROUTINE X 2)  CULTURE, BLOOD (ROUTINE X 2)   No results found.   No diagnosis found.    MDM   Date: 09/30/2012  Rate: 109   Rhythm: sinus tachycardia  QRS Axis: normal  Intervals: normal  ST/T Wave abnormalities: normal  Conduction Disutrbances:none  Narrative Interpretation:   Old EKG Reviewed: none available  Pt with negative workup here, likely with viral illness  Patient stable at time of discharge         Toy Baker, MD 09/30/12 1326

## 2012-09-30 NOTE — ED Notes (Signed)
Pt given a urinal. Lab at the bedside.

## 2012-09-30 NOTE — ED Notes (Signed)
Meal tray ordered 

## 2012-10-01 ENCOUNTER — Other Ambulatory Visit: Payer: Self-pay | Admitting: Neurological Surgery

## 2012-10-01 LAB — URINE CULTURE: Colony Count: 5000

## 2012-10-06 ENCOUNTER — Encounter (HOSPITAL_COMMUNITY): Payer: Self-pay | Admitting: Pharmacy Technician

## 2012-10-09 ENCOUNTER — Encounter (HOSPITAL_COMMUNITY): Payer: Self-pay

## 2012-10-09 ENCOUNTER — Encounter (HOSPITAL_COMMUNITY)
Admission: RE | Admit: 2012-10-09 | Discharge: 2012-10-09 | Disposition: A | Discharge status: Home / Self Care | Payer: Medicare Other | Source: Ambulatory Visit | Attending: Neurological Surgery | Admitting: Neurological Surgery

## 2012-10-09 HISTORY — DX: Xerosis cutis: L85.3

## 2012-10-09 LAB — SURGICAL PCR SCREEN
MRSA, PCR: NEGATIVE
Staphylococcus aureus: NEGATIVE

## 2012-10-09 LAB — TYPE AND SCREEN
ABO/RH(D): AB POS
Antibody Screen: NEGATIVE

## 2012-10-09 NOTE — Pre-Procedure Instructions (Signed)
20 TARANCE BALAN  10/09/2012   Your procedure is scheduled on:  Tues, Dec 17 @ 1:45 PM  Report to [facility] at 10:45 AM.  Call this number if you have problems the morning of surgery: [phone]   Remember:   Do not eat food:After Midnight.    Take these medicines the morning of surgery with A SIP OF WATER: Pantoprazole(Prilosec) and Metoprolol(Lopressor)              Discontinue Aspirin!!  Do not wear jewelry  Do not wear lotions, powders, or colonges. You may wear deodorant.  Men may shave face and neck.  Do not bring valuables to the hospital.  Contacts, dentures or bridgework may not be worn into surgery.  Leave suitcase in the car. After surgery it may be brought to your room.  For patients admitted to the hospital, checkout time is 11:00 AM the day of discharge.   Patients discharged the day of surgery will not be allowed to drive home.  Special Instructions: Shower using CHG 2 nights before surgery and the night before surgery.  If you shower the day of surgery use CHG.  Use special wash - you have one bottle of CHG for all showers.  You should use approximately 1/3 of the bottle for each shower.   Please read over the following fact sheets that you were given: Pain Booklet, Coughing and Deep Breathing, Blood Transfusion Information, MRSA Information and Surgical Site Infection Prevention

## 2012-10-09 NOTE — Progress Notes (Signed)
Cardiologist is Dr.Henry Katrinka Blazing with last visit 04/2012---[facility]--to request  Stress test done about 79yrs ago and to request Echo done about 56yrs ago-to request from Dr.Henry [facility] cath in epic  Medical MD is Dr.John Valentina Lucks  EKG and CXR in epic from 09/30/12

## 2012-10-12 MED ORDER — CEFAZOLIN SODIUM-DEXTROSE 2-3 GM-% IV SOLR
2.0000 g | INTRAVENOUS | Status: AC
  Administered 2012-10-13: 2 g via INTRAVENOUS
  Filled 2012-10-12: qty 50

## 2012-10-12 NOTE — Consult Note (Addendum)
Anesthesia chart review: Patient is a 71 year old male scheduled for L3-4 revision of arthrodesis with bone morphogenic protein on 10/13/2012 by Dr. Danielle Dess. History includes former smoker, AAA s/p open repair '09, CAD/inferior MI '93 and inferolateral MI '99 with history of RCA angioplasty and OM1 and LAD stents > 15 years ago, combined systolic and diastolic CHF, obstructive sleep apnea, diabetes mellitus type 2, reflux, hypertension, hypercholesterolemia.  He underwent L3, L4 decompressive laminectomies, L3-4, L4-5 PLIF on 05/05/12.  He was cleared by his Cardiologist Dr. Katrinka Blazing prior to that procedure with recommendations to "be careful with fluid administration as the patient will be prone to CHF it overload.".  He did require post-operative cardiology evaluation due to hypotension and tachycardia (without afib).  EF was ordered which showed stable EF.  He also developed a post-operative ileus and was seen by GI.  He was seen in the ED on 09/30/12 for viral illness.  Troponin was negative and EKG stable at that time.  For anesthesia history he reports, his hair began to fall out after one surgery and has developed post-operative ileus with two prior surgeries.  EKG on 09/30/12 showed ST @ 109 bpm, old inferior infarct, small lateral Q waves.  Overall, I think his EKG appears stable since at least 01/01/12.  Echo on 05/06/12 showed: Left ventricle: The cavity size was normal. Systolic function was moderately reduced. The estimated ejection fraction was in the range of 35% to 40%. Regional wall motion abnormalities: There is akinesis of the inferior myocardium; consistent with infarction. Doppler parameters are consistent with abnormal left ventricular relaxation (grade 1 diastolic dysfunction). Trivial mitral valve regurgitation. Left and right atrium mildly dilated.   Cardiac cath on 11/25/10 showed: 1. Patent left anterior descending coronary artery and left circumflex stent.  2. 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  3. Mild-to-moderate decreased left ventricular function, estimated ejection fraction of 40%.  His last stress test was in 2008.  CXR on 09/30/12 showed: 1. No acute cardiopulmonary disease or significant interval change.  2. Stable mild emphysematous changes.  3. Stable pleural based disease of the right upper lobe.  Labs from 09/30/12 and 10/09/12 noted.  I reviewed above with Anesthesiologist Dr. Sampson Goon.  Clinical correlation on the day of surgery.  If no acute CV/CHF symptoms then anticipate patient can proceed as planned.  Shonna Chock, PA-C 10/12/12 1303

## 2012-10-13 ENCOUNTER — Encounter (HOSPITAL_COMMUNITY): Payer: Self-pay | Admitting: *Deleted

## 2012-10-13 ENCOUNTER — Encounter (HOSPITAL_COMMUNITY): Payer: Self-pay | Admitting: Vascular Surgery

## 2012-10-13 ENCOUNTER — Inpatient Hospital Stay (HOSPITAL_COMMUNITY): Payer: Medicare Other

## 2012-10-13 ENCOUNTER — Inpatient Hospital Stay (HOSPITAL_COMMUNITY): Payer: Medicare Other | Admitting: Vascular Surgery

## 2012-10-13 ENCOUNTER — Inpatient Hospital Stay (HOSPITAL_COMMUNITY)
Admission: RE | Admit: 2012-10-13 | Discharge: 2012-10-17 | DRG: 460 | Disposition: A | Discharge status: Home / Self Care | Payer: Medicare Other | Source: Ambulatory Visit | Attending: Neurological Surgery | Admitting: Neurological Surgery

## 2012-10-13 ENCOUNTER — Encounter (HOSPITAL_COMMUNITY)
Admission: RE | Disposition: A | Discharge status: Home / Self Care | Payer: Self-pay | Source: Ambulatory Visit | Attending: Neurological Surgery

## 2012-10-13 DIAGNOSIS — G4730 Sleep apnea, unspecified: Secondary | ICD-10-CM | POA: Diagnosis present

## 2012-10-13 DIAGNOSIS — I2510 Atherosclerotic heart disease of native coronary artery without angina pectoris: Secondary | ICD-10-CM | POA: Diagnosis present

## 2012-10-13 DIAGNOSIS — K219 Gastro-esophageal reflux disease without esophagitis: Secondary | ICD-10-CM | POA: Diagnosis present

## 2012-10-13 DIAGNOSIS — T84498A Other mechanical complication of other internal orthopedic devices, implants and grafts, initial encounter: Principal | ICD-10-CM | POA: Diagnosis present

## 2012-10-13 DIAGNOSIS — E119 Type 2 diabetes mellitus without complications: Secondary | ICD-10-CM | POA: Diagnosis present

## 2012-10-13 DIAGNOSIS — Z9861 Coronary angioplasty status: Secondary | ICD-10-CM

## 2012-10-13 DIAGNOSIS — F3289 Other specified depressive episodes: Secondary | ICD-10-CM | POA: Diagnosis present

## 2012-10-13 DIAGNOSIS — Z87891 Personal history of nicotine dependence: Secondary | ICD-10-CM

## 2012-10-13 DIAGNOSIS — I252 Old myocardial infarction: Secondary | ICD-10-CM

## 2012-10-13 DIAGNOSIS — I10 Essential (primary) hypertension: Secondary | ICD-10-CM | POA: Diagnosis present

## 2012-10-13 DIAGNOSIS — Z01812 Encounter for preprocedural laboratory examination: Secondary | ICD-10-CM

## 2012-10-13 DIAGNOSIS — F329 Major depressive disorder, single episode, unspecified: Secondary | ICD-10-CM | POA: Diagnosis present

## 2012-10-13 DIAGNOSIS — Y831 Surgical operation with implant of artificial internal device as the cause of abnormal reaction of the patient, or of later complication, without mention of misadventure at the time of the procedure: Secondary | ICD-10-CM | POA: Diagnosis present

## 2012-10-13 DIAGNOSIS — I4891 Unspecified atrial fibrillation: Secondary | ICD-10-CM | POA: Diagnosis present

## 2012-10-13 DIAGNOSIS — S32009K Unspecified fracture of unspecified lumbar vertebra, subsequent encounter for fracture with nonunion: Secondary | ICD-10-CM | POA: Diagnosis present

## 2012-10-13 DIAGNOSIS — E7800 Pure hypercholesterolemia, unspecified: Secondary | ICD-10-CM | POA: Diagnosis present

## 2012-10-13 DIAGNOSIS — G5790 Unspecified mononeuropathy of unspecified lower limb: Secondary | ICD-10-CM | POA: Diagnosis present

## 2012-10-13 LAB — GLUCOSE, CAPILLARY: Glucose-Capillary: 218 mg/dL — ABNORMAL HIGH (ref 70–99)

## 2012-10-13 SURGERY — POSTERIOR LUMBAR FUSION 1 LEVEL
Anesthesia: General | Site: Back | Wound class: Clean

## 2012-10-13 MED ORDER — METFORMIN HCL ER 500 MG PO TB24
500.0000 mg | ORAL_TABLET | Freq: Two times a day (BID) | ORAL | Status: DC
  Administered 2012-10-14 – 2012-10-17 (×6): 500 mg via ORAL
  Filled 2012-10-13 (×9): qty 1

## 2012-10-13 MED ORDER — HYDROMORPHONE HCL PF 1 MG/ML IJ SOLN
0.5000 mg | INTRAMUSCULAR | Status: DC | PRN
  Administered 2012-10-13: 1 mg via INTRAVENOUS
  Filled 2012-10-13: qty 1

## 2012-10-13 MED ORDER — FENTANYL CITRATE 0.05 MG/ML IJ SOLN
INTRAMUSCULAR | Status: DC | PRN
  Administered 2012-10-13 (×3): 50 ug via INTRAVENOUS

## 2012-10-13 MED ORDER — ATORVASTATIN CALCIUM 10 MG PO TABS
10.0000 mg | ORAL_TABLET | Freq: Every day | ORAL | Status: DC
  Administered 2012-10-14 – 2012-10-17 (×4): 10 mg via ORAL
  Filled 2012-10-13 (×5): qty 1

## 2012-10-13 MED ORDER — ADULT MULTIVITAMIN W/MINERALS CH
1.0000 | ORAL_TABLET | Freq: Every day | ORAL | Status: DC
  Administered 2012-10-14 – 2012-10-17 (×4): 1 via ORAL
  Filled 2012-10-13 (×5): qty 1

## 2012-10-13 MED ORDER — VITAMIN B-12 1000 MCG PO TABS
1000.0000 ug | ORAL_TABLET | ORAL | Status: DC
  Administered 2012-10-15: 1000 ug via ORAL
  Filled 2012-10-13 (×3): qty 1

## 2012-10-13 MED ORDER — BACITRACIN 50000 UNITS IM SOLR
INTRAMUSCULAR | Status: AC
  Filled 2012-10-13: qty 1

## 2012-10-13 MED ORDER — ACETAMINOPHEN 650 MG RE SUPP: 650.0000 mg | RECTAL | Status: DC | PRN

## 2012-10-13 MED ORDER — ONDANSETRON HCL 4 MG/2ML IJ SOLN
INTRAMUSCULAR | Status: DC | PRN
  Administered 2012-10-13: 4 mg via INTRAVENOUS

## 2012-10-13 MED ORDER — PHENYLEPHRINE HCL 10 MG/ML IJ SOLN
10.0000 mg | INTRAVENOUS | Status: DC | PRN
  Administered 2012-10-13: 50 ug/min via INTRAVENOUS

## 2012-10-13 MED ORDER — LACTATED RINGERS IV SOLN
INTRAVENOUS | Status: DC | PRN
  Administered 2012-10-13 (×2): via INTRAVENOUS

## 2012-10-13 MED ORDER — VECURONIUM BROMIDE 10 MG IV SOLR
INTRAVENOUS | Status: DC | PRN
  Administered 2012-10-13 (×2): 2 mg via INTRAVENOUS
  Administered 2012-10-13: 3 mg via INTRAVENOUS

## 2012-10-13 MED ORDER — ACETAMINOPHEN 325 MG PO TABS
650.0000 mg | ORAL_TABLET | ORAL | Status: DC | PRN
  Administered 2012-10-15: 650 mg via ORAL
  Filled 2012-10-13: qty 2

## 2012-10-13 MED ORDER — OXYCODONE HCL 5 MG/5ML PO SOLN: 5.0000 mg | Freq: Once | ORAL | Status: DC | PRN

## 2012-10-13 MED ORDER — LIDOCAINE HCL (CARDIAC) 20 MG/ML IV SOLN
INTRAVENOUS | Status: DC | PRN
  Administered 2012-10-13: 100 mg via INTRAVENOUS

## 2012-10-13 MED ORDER — THROMBIN 20000 UNITS EX SOLR
CUTANEOUS | Status: DC | PRN
  Administered 2012-10-13: 15:00:00 via TOPICAL

## 2012-10-13 MED ORDER — SODIUM CHLORIDE 0.9 % IV SOLN: 250.0000 mL | INTRAVENOUS | Status: DC

## 2012-10-13 MED ORDER — ALVIMOPAN 12 MG PO CAPS: 12.0000 mg | ORAL_CAPSULE | Freq: Once | ORAL | Status: DC

## 2012-10-13 MED ORDER — METOPROLOL TARTRATE 25 MG PO TABS
25.0000 mg | ORAL_TABLET | Freq: Two times a day (BID) | ORAL | Status: DC
  Administered 2012-10-14 – 2012-10-17 (×4): 25 mg via ORAL
  Filled 2012-10-13 (×9): qty 1

## 2012-10-13 MED ORDER — ALUM & MAG HYDROXIDE-SIMETH 200-200-20 MG/5ML PO SUSP: 30.0000 mL | Freq: Four times a day (QID) | ORAL | Status: DC | PRN

## 2012-10-13 MED ORDER — DIAZEPAM 5 MG PO TABS
5.0000 mg | ORAL_TABLET | Freq: Four times a day (QID) | ORAL | Status: DC | PRN
  Administered 2012-10-17: 5 mg via ORAL
  Filled 2012-10-13: qty 1

## 2012-10-13 MED ORDER — BUPIVACAINE HCL (PF) 0.5 % IJ SOLN
INTRAMUSCULAR | Status: DC | PRN
  Administered 2012-10-13: 20 mL

## 2012-10-13 MED ORDER — NEOSTIGMINE METHYLSULFATE 1 MG/ML IJ SOLN
INTRAMUSCULAR | Status: DC | PRN
  Administered 2012-10-13: 5 mg via INTRAVENOUS

## 2012-10-13 MED ORDER — ROCURONIUM BROMIDE 100 MG/10ML IV SOLN
INTRAVENOUS | Status: DC | PRN
  Administered 2012-10-13: 50 mg via INTRAVENOUS

## 2012-10-13 MED ORDER — ALBUMIN HUMAN 5 % IV SOLN
INTRAVENOUS | Status: DC | PRN
  Administered 2012-10-13: 16:00:00 via INTRAVENOUS

## 2012-10-13 MED ORDER — ASPIRIN EC 81 MG PO TBEC
81.0000 mg | DELAYED_RELEASE_TABLET | Freq: Every day | ORAL | Status: DC
  Administered 2012-10-14 – 2012-10-17 (×4): 81 mg via ORAL
  Filled 2012-10-13 (×5): qty 1

## 2012-10-13 MED ORDER — PANTOPRAZOLE SODIUM 40 MG PO TBEC
40.0000 mg | DELAYED_RELEASE_TABLET | Freq: Every day | ORAL | Status: DC
  Administered 2012-10-14 – 2012-10-16 (×3): 40 mg via ORAL
  Filled 2012-10-13 (×3): qty 1

## 2012-10-13 MED ORDER — PROPOFOL 10 MG/ML IV BOLUS
INTRAVENOUS | Status: DC | PRN
  Administered 2012-10-13: 140 mg via INTRAVENOUS

## 2012-10-13 MED ORDER — LIDOCAINE-EPINEPHRINE 1 %-1:100000 IJ SOLN
INTRAMUSCULAR | Status: DC | PRN
  Administered 2012-10-13: 10 mL

## 2012-10-13 MED ORDER — SODIUM CHLORIDE 0.9 % IJ SOLN
3.0000 mL | Freq: Two times a day (BID) | INTRAMUSCULAR | Status: DC
  Administered 2012-10-14 – 2012-10-16 (×4): 3 mL via INTRAVENOUS

## 2012-10-13 MED ORDER — 0.9 % SODIUM CHLORIDE (POUR BTL) OPTIME
TOPICAL | Status: DC | PRN
  Administered 2012-10-13: 1000 mL

## 2012-10-13 MED ORDER — SODIUM CHLORIDE 0.9 % IV SOLN
INTRAVENOUS | Status: AC
  Filled 2012-10-13: qty 500

## 2012-10-13 MED ORDER — PANTOPRAZOLE SODIUM 40 MG PO TBEC: 40.0000 mg | DELAYED_RELEASE_TABLET | Freq: Every day | ORAL | Status: DC

## 2012-10-13 MED ORDER — SODIUM CHLORIDE 0.9 % IV SOLN
INTRAVENOUS | Status: DC
  Administered 2012-10-13: 1000 mL via INTRAVENOUS
  Administered 2012-10-15: 06:00:00 via INTRAVENOUS

## 2012-10-13 MED ORDER — RAMIPRIL 10 MG PO CAPS
10.0000 mg | ORAL_CAPSULE | Freq: Every day | ORAL | Status: DC
  Administered 2012-10-14 – 2012-10-17 (×4): 10 mg via ORAL
  Filled 2012-10-13 (×5): qty 1

## 2012-10-13 MED ORDER — KETOROLAC TROMETHAMINE 15 MG/ML IJ SOLN
15.0000 mg | Freq: Four times a day (QID) | INTRAMUSCULAR | Status: DC
  Administered 2012-10-14 – 2012-10-17 (×14): 15 mg via INTRAVENOUS
  Filled 2012-10-13 (×20): qty 1

## 2012-10-13 MED ORDER — ACETAMINOPHEN 10 MG/ML IV SOLN
INTRAVENOUS | Status: AC
  Administered 2012-10-13: 1000 mg via INTRAVENOUS
  Filled 2012-10-13: qty 100

## 2012-10-13 MED ORDER — SODIUM CHLORIDE 0.9 % IR SOLN
Status: DC | PRN
  Administered 2012-10-13: 15:00:00

## 2012-10-13 MED ORDER — PROMETHAZINE HCL 25 MG/ML IJ SOLN: 6.2500 mg | INTRAMUSCULAR | Status: DC | PRN

## 2012-10-13 MED ORDER — GLYCOPYRROLATE 0.2 MG/ML IJ SOLN
INTRAMUSCULAR | Status: DC | PRN
  Administered 2012-10-13: .8 mg via INTRAVENOUS

## 2012-10-13 MED ORDER — CEFAZOLIN SODIUM 1-5 GM-% IV SOLN
1.0000 g | Freq: Three times a day (TID) | INTRAVENOUS | Status: AC
  Administered 2012-10-13 – 2012-10-14 (×2): 1 g via INTRAVENOUS
  Filled 2012-10-13 (×2): qty 50

## 2012-10-13 MED ORDER — OXYCODONE HCL 5 MG PO TABS: 5.0000 mg | ORAL_TABLET | Freq: Once | ORAL | Status: DC | PRN

## 2012-10-13 MED ORDER — PHENOL 1.4 % MT LIQD: 1.0000 | OROMUCOSAL | Status: DC | PRN

## 2012-10-13 MED ORDER — MENTHOL 3 MG MT LOZG: 1.0000 | LOZENGE | OROMUCOSAL | Status: DC | PRN

## 2012-10-13 MED ORDER — FUROSEMIDE 40 MG PO TABS
40.0000 mg | ORAL_TABLET | Freq: Every day | ORAL | Status: DC
  Administered 2012-10-14 – 2012-10-17 (×4): 40 mg via ORAL
  Filled 2012-10-13 (×5): qty 1

## 2012-10-13 MED ORDER — PHENYLEPHRINE HCL 10 MG/ML IJ SOLN
INTRAMUSCULAR | Status: DC | PRN
  Administered 2012-10-13: 40 ug via INTRAVENOUS
  Administered 2012-10-13: 80 ug via INTRAVENOUS
  Administered 2012-10-13: 25 ug via INTRAVENOUS
  Administered 2012-10-13: 120 ug via INTRAVENOUS

## 2012-10-13 MED ORDER — MIDAZOLAM HCL 5 MG/5ML IJ SOLN
INTRAMUSCULAR | Status: DC | PRN
  Administered 2012-10-13 (×2): 1 mg via INTRAVENOUS

## 2012-10-13 MED ORDER — OXYCODONE-ACETAMINOPHEN 5-325 MG PO TABS
1.0000 | ORAL_TABLET | ORAL | Status: DC | PRN
  Administered 2012-10-14 – 2012-10-17 (×3): 2 via ORAL
  Administered 2012-10-17: 1 via ORAL
  Filled 2012-10-13 (×3): qty 2
  Filled 2012-10-13: qty 1

## 2012-10-13 MED ORDER — SODIUM CHLORIDE 0.9 % IJ SOLN: 3.0000 mL | INTRAMUSCULAR | Status: DC | PRN

## 2012-10-13 MED ORDER — POTASSIUM CHLORIDE CRYS ER 20 MEQ PO TBCR
20.0000 meq | EXTENDED_RELEASE_TABLET | Freq: Every day | ORAL | Status: DC
  Administered 2012-10-14 – 2012-10-17 (×4): 20 meq via ORAL
  Filled 2012-10-13 (×5): qty 1

## 2012-10-13 MED ORDER — ONDANSETRON HCL 4 MG/2ML IJ SOLN: 4.0000 mg | INTRAMUSCULAR | Status: DC | PRN

## 2012-10-13 MED ORDER — HYDROMORPHONE HCL PF 1 MG/ML IJ SOLN: 0.2500 mg | INTRAMUSCULAR | Status: DC | PRN

## 2012-10-13 MED ORDER — INSULIN ASPART 100 UNIT/ML ~~LOC~~ SOLN
0.0000 [IU] | SUBCUTANEOUS | Status: DC
  Administered 2012-10-13 – 2012-10-16 (×13): 3 [IU] via SUBCUTANEOUS
  Administered 2012-10-16 (×3): 2 [IU] via SUBCUTANEOUS
  Administered 2012-10-16 – 2012-10-17 (×2): 3 [IU] via SUBCUTANEOUS
  Administered 2012-10-17: 5 [IU] via SUBCUTANEOUS
  Administered 2012-10-17: 3 [IU] via SUBCUTANEOUS

## 2012-10-13 SURGICAL SUPPLY — 62 items
7.5 x 55mm Polyaxial Screw ×2 IMPLANT
BAG DECANTER FOR FLEXI CONT (MISCELLANEOUS) ×2 IMPLANT
BLADE SURG ROTATE 9660 (MISCELLANEOUS) IMPLANT
BUR MATCHSTICK NEURO 3.0 LAGG (BURR) ×2 IMPLANT
CANISTER SUCTION 2500CC (MISCELLANEOUS) ×2 IMPLANT
CLOTH BEACON ORANGE TIMEOUT ST (SAFETY) ×2 IMPLANT
CONT SPEC 4OZ CLIKSEAL STRL BL (MISCELLANEOUS) ×4 IMPLANT
COVER BACK TABLE 24X17X13 BIG (DRAPES) IMPLANT
COVER TABLE BACK 60X90 (DRAPES) ×2 IMPLANT
DECANTER SPIKE VIAL GLASS SM (MISCELLANEOUS) ×2 IMPLANT
DERMABOND ADVANCED (GAUZE/BANDAGES/DRESSINGS) ×1
DERMABOND ADVANCED .7 DNX12 (GAUZE/BANDAGES/DRESSINGS) ×1 IMPLANT
DRAPE C-ARM 42X72 X-RAY (DRAPES) ×4 IMPLANT
DRAPE LAPAROTOMY 100X72X124 (DRAPES) ×2 IMPLANT
DRAPE POUCH INSTRU U-SHP 10X18 (DRAPES) ×2 IMPLANT
DRAPE PROXIMA HALF (DRAPES) IMPLANT
DURAPREP 26ML APPLICATOR (WOUND CARE) ×2 IMPLANT
ELECT REM PT RETURN 9FT ADLT (ELECTROSURGICAL) ×2
ELECTRODE REM PT RTRN 9FT ADLT (ELECTROSURGICAL) ×1 IMPLANT
GAUZE SPONGE 4X4 16PLY XRAY LF (GAUZE/BANDAGES/DRESSINGS) ×2 IMPLANT
GLOVE BIOGEL PI IND STRL 8 (GLOVE) ×1 IMPLANT
GLOVE BIOGEL PI IND STRL 8.5 (GLOVE) ×2 IMPLANT
GLOVE BIOGEL PI INDICATOR 8 (GLOVE) ×1
GLOVE BIOGEL PI INDICATOR 8.5 (GLOVE) ×2
GLOVE ECLIPSE 7.5 STRL STRAW (GLOVE) ×6 IMPLANT
GLOVE ECLIPSE 8.5 STRL (GLOVE) ×8 IMPLANT
GLOVE EXAM NITRILE LRG STRL (GLOVE) IMPLANT
GLOVE EXAM NITRILE MD LF STRL (GLOVE) ×4 IMPLANT
GLOVE EXAM NITRILE XL STR (GLOVE) IMPLANT
GLOVE EXAM NITRILE XS STR PU (GLOVE) IMPLANT
GOWN BRE IMP SLV AUR LG STRL (GOWN DISPOSABLE) IMPLANT
GOWN BRE IMP SLV AUR XL STRL (GOWN DISPOSABLE) ×2 IMPLANT
GOWN STRL REIN 2XL LVL4 (GOWN DISPOSABLE) ×6 IMPLANT
KIT BASIN OR (CUSTOM PROCEDURE TRAY) ×2 IMPLANT
KIT INFUSE X SMALL 1.4CC (Orthopedic Implant) ×2 IMPLANT
KIT ROOM TURNOVER OR (KITS) ×2 IMPLANT
MARKER SKIN DUAL TIP RULER LAB (MISCELLANEOUS) ×2 IMPLANT
MILL MEDIUM DISP (BLADE) ×2 IMPLANT
NEEDLE HYPO 22GX1.5 SAFETY (NEEDLE) ×2 IMPLANT
NS IRRIG 1000ML POUR BTL (IV SOLUTION) ×2 IMPLANT
PACK FOAM VITOSS 10CC (Orthopedic Implant) ×2 IMPLANT
PACK LAMINECTOMY NEURO (CUSTOM PROCEDURE TRAY) ×2 IMPLANT
PAD ARMBOARD 7.5X6 YLW CONV (MISCELLANEOUS) ×6 IMPLANT
PATTIES SURGICAL .5 X1 (DISPOSABLE) ×2 IMPLANT
ROD TI 5.5MM 6CM (Rod) ×4 IMPLANT
SCREW 50MM (Screw) ×2 IMPLANT
SCREW POLYAX 6.5X45MM (Screw) ×4 IMPLANT
SCREW SET SPINAL STD HEXALOBE (Screw) ×12 IMPLANT
SPONGE GAUZE 4X4 12PLY (GAUZE/BANDAGES/DRESSINGS) ×2 IMPLANT
SPONGE LAP 4X18 X RAY DECT (DISPOSABLE) IMPLANT
SPONGE SURGIFOAM ABS GEL 100 (HEMOSTASIS) ×2 IMPLANT
SUT VIC AB 1 CT1 18XBRD ANBCTR (SUTURE) ×2 IMPLANT
SUT VIC AB 1 CT1 8-18 (SUTURE) ×2
SUT VIC AB 2-0 CP2 18 (SUTURE) ×4 IMPLANT
SUT VIC AB 3-0 SH 8-18 (SUTURE) ×2 IMPLANT
SYR 20ML ECCENTRIC (SYRINGE) ×2 IMPLANT
TAPE CLOTH SURG 4X10 WHT LF (GAUZE/BANDAGES/DRESSINGS) ×2 IMPLANT
TOWEL OR 17X24 6PK STRL BLUE (TOWEL DISPOSABLE) ×2 IMPLANT
TOWEL OR 17X26 10 PK STRL BLUE (TOWEL DISPOSABLE) ×2 IMPLANT
TRAP SPECIMEN MUCOUS 40CC (MISCELLANEOUS) ×2 IMPLANT
TRAY FOLEY CATH 14FRSI W/METER (CATHETERS) ×2 IMPLANT
WATER STERILE IRR 1000ML POUR (IV SOLUTION) ×2 IMPLANT

## 2012-10-13 NOTE — Preoperative (Signed)
Beta Blockers   Reason not to administer Beta Blockers:Not Applicable 

## 2012-10-13 NOTE — Addendum Note (Signed)
Addendum  created 10/13/12 1919 by Sharlet Salina, CRNA   Modules edited:Charges VN

## 2012-10-13 NOTE — H&P (Signed)
[name] is an 71 y.o. male.   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 This reveals the presence of a solid arthrodesis forming at L4-L5 however at L3-L4 he has loosening of the hardware with malposition of particularly the right-sided L3 screw it is felt that this trauma is continuing to exacerbate pain in his back and his lower extremities. His been advised regarding the need for surgical revision of this process and is now admitted for this.  Past Medical History  Diagnosis Date  . Hypercholesteremia     takes lipitor daily  . AAA (abdominal aortic aneurysm)   . Pain     back pain chronic- seen at pain clinic  . Neuropathy     in legs  . Bronchitis     hx of  . Ileus   . Arthritis     BacK  . Incisional hernia     "small, from AAA"  . Coronary atherosclerosis of native coronary artery     ejection fraction of 40%, prior inferior myocardial infarction in February 1993, PTCA, LAD rotational atherectomy in August of 1998, acute inferolateral myocardial infarction, ventricular fibrillation in July of 1999, PTCA and stent of obtuse marginal 1, 100% RCA 1/12 with collaterals from patent circumflex and LAD  . Hypertension     takes Ramipril daily  . MI (myocardial infarction)     1992 and 1997 sees Dr. Mendel Ryder as needed   . Dysrhythmia     "got out of rhythym a couple , but it straightened back out."  . Sleep apnea     uses CPAP--sleep study done at least 65yrs ago  . Chronic back pain   . Dry skin   .  Acid reflux     takes Prilosec and Protonix daily  . Diabetes mellitus     takes Metformin daily  . Complication of anesthesia     -years ago hair fell out.;ileus after 2 of his surgeries    Past Surgical History  Procedure Date  . Abdominal surgery   . Abdominal aortic aneurysm repair   . Colonoscopy   . Cholecystectomy   . Back surgery 2013    Miminal Invasive x2 in [place].  . Cardiac catheterization 2009/2012  . Coronary angioplasty     2 stents    Family History  Problem Relation Age of Onset  . Diabetes Mother   . Coronary artery disease Mother   . Diabetes Father   . Arthritis Father   . Rheum arthritis Father   . Coronary artery disease Father    Social History:  reports that he quit smoking about 20 years ago. He has never used smokeless tobacco. He reports that he drinks alcohol. He reports that he does not use illicit drugs.  Allergies:  Allergies  Allergen Reactions  . Cymbalta (Duloxetine Hcl) Nausea And Vomiting and Other (See Comments)    Rapid drop in blood pressure  . Gabapentin Other (See Comments)    Makes me goofy, keeps me off balance, clouds my thinking  .  Lyrica (Pregabalin) Other (See Comments)    Makes me goofy, keeps me off balance, clouds my thinking    No prescriptions prior to admission    No results found for this or any previous visit (from the past 48 hour(s)). No results found.  Review of Systems  Constitutional: Positive for malaise/fatigue.  HENT: Negative.   Eyes: Negative.   Respiratory: Negative.   Cardiovascular: Negative.   Gastrointestinal: Negative.        History of partial small bowel obstruction with surgical interventions  Genitourinary: Negative.   Musculoskeletal: Negative.   Skin: Negative.   Neurological: Positive for focal weakness and weakness.  Endo/Heme/Allergies: Negative.   Psychiatric/Behavioral: Positive for depression.    There were no vitals taken for this visit. Physical Exam   Constitutional: He is oriented to person, place, and time. He appears well-developed and well-nourished.  HENT:  Head: Normocephalic and atraumatic.  Eyes: Pupils are equal, round, and reactive to light.  Neck: Normal range of motion. Neck supple.  Cardiovascular: Normal rate and regular rhythm.   Respiratory: Effort normal and breath sounds normal.  GI: Soft. Bowel sounds are normal.  Musculoskeletal:       Tender across his lumbosacral spine. Incision is well-healed on his back. Diffuse weakness in lower extremities including the iliopsoas quadricep tibialis anterior and gastrocs.  Neurological: He is alert and oriented to person, place, and time.       Deep tendon reflexes are all absent cranial nerve examination is within limits of normal.  Skin: Skin is warm and dry.  Psychiatric: He has a normal mood and affect. His behavior is normal. Judgment and thought content normal.     Assessment/Plan       CT scan of the lumbar spinewas completed on 09/22/2012.  I reviewed the study with him.  After thorough review, it appears that L4-5 is healing nicely with pedicle fixation in good position at those levels.  L3-4, however, demonstrates that there has been no incorporation of the bone graft and there has been some collapse of the disc space, and there is loosening particularly of the left-sided screw but also malpositioning of the right-sided screw in the pedicle itself.  There may be some irritability of the right-sided L4 nerve root which is why [name] is continuing to have significant symptoms on that right side.    I indicated to [name] that the films suggest that he is not healing at all at L3-4 and there is some loose hardware.  I have advised that this hardware needs to be removed and repositioned.  The arthrodesis needs to be revamped so that we can get some healing bone in this area.  I believe that we should supplement his fixation and arthrodesis with bone morphogenic protein in an  effort to try to get this process to heal together as one.  I will also look at and decompress the right-sided nerve roots where he has the worst of his radicular symptoms.    This is a substantial undertaken and I discussed with [name] the concerns I have particularly of his postoperative management.  Last time during his postoperative course, he had some partial small bowel obstruction which took over a week's time to resolve.  We want to be on top of this to minimize the difficulties with this process during the postop time frame and try to get him up and mobilized as quickly as possible.  At this point, he notes he is not tolerating his living  situation very well with chronic pain, severely limited mobility and substantial reliance on narcotic analgesics for pain control.   [name] J 10/13/2012, 9:55 AM

## 2012-10-13 NOTE — Op Note (Signed)
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 Procedure revision of posterior fixation L3 L4-L5 with placement of new screws at L3 and L4, posterior lateral arthrodesis with allograft and bone morphogenic protein L3-L4  Surgeon Barnett Abu Asst. Lelon Perla Anesthesia: Gen. endotracheal  The patient is a 71 year old individual who underwent surgical decompression arthrodesis from L3-L5 in July of this past year has had increasing amounts of back pain with right leg pain and left leg pain is found to have severe spondylitic disease in addition to his decompression arthrodesis he says from L3-L5 is found that loss of fixation of L3 been advised regarding the need to revise his surgical construct and do some additional posterior lateral arthrodesis at the level of L3-L4.  Procedure the patient was brought to the operating room supine on a stretcher after the smooth induction of general endotracheal anesthesia he was turned prone the back was prepped with all DuraPrep and the previous superior most portion of the incision was reopened dissection was carried down to the lumbar dorsal fascia which was opened on either side of midline until I could identify some hardware this was identified as the most superiorly placed screws. Dissection was then carried out laterally to expose of the screws at L4-L5 and with sterile exposed caps removed. The rods were removed first on the right than on the left was noted to be gross movement in the interspace at L3-L4 although L4-L5 appeared to move solidly as was expected from the CT scan. The dissection was then carried out over the transverse processes of L3 and L4 there is scant amount of any graft that was in the lateral gutters at L3-L4 at this time because of the nature of the hardware of the superior screws being placed medial to  lateral noted particularly that left side screw at some significant looseness to this was removed and at the L3 vertebrae new screws were directed from the lateral aspect of the pedicle through to the medial aspect of the vertebral bodies and these were 6.5 x 45 mm screws. Then screws were evaluated L4 is noted that the left-sided screw at L4 was somewhat loose and this was replaced by 7.5 x 55 mm screw. On the screw was were then connected together with a 70 mm precontoured rod first placed on the right side and placed on the left side of the screw caps were applied and a were tightened and torqued to the appropriate position. Once this was completed the lateral gutters which had previously been decorticated out to the transverse processes of L L3 and L4 were packed with allograft in the form of the cost which was mixed with infused bone sponge is from a an extra small mixture of infused and packed along with the grafting material once this was completed hemostasis and the soft tissues was obtained care taken to make sure that all hardware was well placed nerve roots were decompressed at L3 and L4 and once this was verified then the back was closed #1 Vicryl in interrupted fashion and the lumbar dorsal fascia 2-0 Vicryl the subcutaneous tissue the cutaneous tissues and 3-0 Vicryl subcuticularly Dermabond was placed on the skin the patient tolerated procedure well returned to recovery room in stable condition.

## 2012-10-13 NOTE — Anesthesia Procedure Notes (Signed)
Procedure Name: Intubation Date/Time: 10/13/2012 2:38 PM Performed by: Sharlene Dory E Pre-anesthesia Checklist: Patient identified, Emergency Drugs available, Suction available, Patient being monitored and Timeout performed Patient Re-evaluated:Patient Re-evaluated prior to inductionOxygen Delivery Method: Circle system utilized Preoxygenation: Pre-oxygenation with 100% oxygen Intubation Type: IV induction Ventilation: Mask ventilation without difficulty Laryngoscope Size: Mac and 3 Grade View: Grade I Tube size: 7.5 mm Number of attempts: 1 Airway Equipment and Method: Stylet Placement Confirmation: ETT inserted through vocal cords under direct vision,  positive ETCO2 and breath sounds checked- equal and bilateral Secured at: 21 cm Tube secured with: Tape Dental Injury: Teeth and Oropharynx as per pre-operative assessment

## 2012-10-13 NOTE — Progress Notes (Signed)
Patient ID: [name], male   DOB: [birth year], 71 y.o.   MRN: [id number] Vital signs stable. NG in place. Patient somewhat somnolent. Motor function intact.

## 2012-10-13 NOTE — Transfer of Care (Signed)
Immediate Anesthesia Transfer of Care Note  Patient: [name]  Procedure(s) Performed: Procedure(s) (LRB) with comments: POSTERIOR LUMBAR FUSION 1 LEVEL (N/A) - Posterior Lumbar Three-Five Revision of Arthrodesis and Posteriolateral Allograft   Patient Location: PACU  Anesthesia Type:General  Level of Consciousness: sedated  Airway & Oxygen Therapy: Patient Spontanous Breathing and Patient connected to face mask oxygen  Post-op Assessment: Report given to PACU RN, Post -op Vital signs reviewed and stable and Patient moving all extremities  Post vital signs: Reviewed and stable  Complications: No apparent anesthesia complications

## 2012-10-13 NOTE — Anesthesia Preprocedure Evaluation (Addendum)
Anesthesia Evaluation  Patient identified by MRN, date of birth, ID band Patient awake    Reviewed: Allergy & Precautions, H&P , NPO status , Patient's Chart, lab work & pertinent test results, reviewed documented beta blocker date and time   Airway Mallampati: I TM Distance: >3 FB Neck ROM: Full    Dental No notable dental hx. (+) Partial Lower, Partial Upper and Dental Advisory Given   Pulmonary sleep apnea and Continuous Positive Airway Pressure Ventilation , former smoker,  breath sounds clear to auscultation  Pulmonary exam normal       Cardiovascular hypertension, On Medications and On Home Beta Blockers + CAD, + Past MI and + Peripheral Vascular Disease + dysrhythmias Atrial Fibrillation Rhythm:Regular Rate:Normal     Neuro/Psych Depression negative neurological ROS  negative psych ROS   GI/Hepatic Neg liver ROS, GERD-  Medicated,  Endo/Other  negative endocrine ROSdiabetes, Well Controlled, Oral Hypoglycemic Agents  Renal/GU negative Renal ROS  negative genitourinary   Musculoskeletal   Abdominal   Peds  Hematology negative hematology ROS (+)   Anesthesia Other Findings   Reproductive/Obstetrics negative OB ROS                         Anesthesia Physical Anesthesia Plan  ASA: III  Anesthesia Plan: General   Post-op Pain Management:    Induction: Intravenous  Airway Management Planned: Oral ETT  Additional Equipment:   Intra-op Plan:   Post-operative Plan: Extubation in OR  Informed Consent: I have reviewed the patients History and Physical, chart, labs and discussed the procedure including the risks, benefits and alternatives for the proposed anesthesia with the patient or authorized representative who has indicated his/her understanding and acceptance.     Plan Discussed with: CRNA and Surgeon  Anesthesia Plan Comments:         Anesthesia Quick Evaluation

## 2012-10-13 NOTE — Anesthesia Postprocedure Evaluation (Signed)
  Anesthesia Post-op Note  Patient: [name]  Procedure(s) Performed: Procedure(s) (LRB) with comments: POSTERIOR LUMBAR FUSION 1 LEVEL (N/A) - Posterior Lumbar Three-Five Revision of Arthrodesis and Posteriolateral Allograft   Patient Location: PACU  Anesthesia Type:General  Level of Consciousness: awake  Airway and Oxygen Therapy: Patient Spontanous Breathing  Post-op Pain: mild  Post-op Assessment: Post-op Vital signs reviewed, Patient's Cardiovascular Status Stable, Respiratory Function Stable, Patent Airway, No signs of Nausea or vomiting and Pain level controlled  Post-op Vital Signs: stable  Complications: No apparent anesthesia complications

## 2012-10-14 LAB — GLUCOSE, CAPILLARY
Glucose-Capillary: 159 mg/dL — ABNORMAL HIGH (ref 70–99)
Glucose-Capillary: 160 mg/dL — ABNORMAL HIGH (ref 70–99)
Glucose-Capillary: 167 mg/dL — ABNORMAL HIGH (ref 70–99)
Glucose-Capillary: 173 mg/dL — ABNORMAL HIGH (ref 70–99)
Glucose-Capillary: 178 mg/dL — ABNORMAL HIGH (ref 70–99)
Glucose-Capillary: 192 mg/dL — ABNORMAL HIGH (ref 70–99)
Glucose-Capillary: 200 mg/dL — ABNORMAL HIGH (ref 70–99)

## 2012-10-14 NOTE — Clinical Social Work Note (Signed)
Clinical Social Work   CSW received consult for SNF. CSW reviewed chart and discussed pt with RN. Awaiting PT/OT evals for discharge recommendations. CSW will assess for SNF, if appropriate. Please call with any urgent needs. CSW will continue to follow.   Dede Query, MSW, [facility] Majors [phone]

## 2012-10-14 NOTE — Evaluation (Signed)
Occupational Therapy Evaluation Patient Details Name: [name] MRN: [id number] DOB: [birth year] Today's Date: 10/14/2012 Time: 2841-3244 OT Time Calculation (min): 25 min  OT Assessment / Plan / Recommendation Clinical Impression  Pt is recovering from L3-4 PLIF.  Pt underwent back surgery in July of this year as well.  Will follow to reeducate in back precautions and ADL. Pt will rely on his wife to assist with LB ADL until he is able to perform independently.  Do not anticipate pt will need further OT upon d/c.    OT Assessment  Patient needs continued OT Services    Follow Up Recommendations  No OT follow up;Supervision/Assistance - 24 hour    Barriers to Discharge      Equipment Recommendations  None recommended by OT    Recommendations for Other Services    Frequency  Min 2X/week    Precautions / Restrictions Precautions Precautions: Back;Fall Precaution Booklet Issued: Yes (comment) Precaution Comments: reviewed back precautions Required Braces or Orthoses: Spinal Brace Spinal Brace: Applied in sitting position Restrictions Weight Bearing Restrictions: No   Pertinent Vitals/Pain 4/10, back, premedicated, repositioned    ADL  Eating/Feeding: Independent Where Assessed - Eating/Feeding: Chair Grooming: Wash/dry hands;Brushing hair;Set up Where Assessed - Grooming: Unsupported sitting Upper Body Bathing: Minimal assistance Where Assessed - Upper Body Bathing: Unsupported sitting Lower Body Bathing: Maximal assistance Where Assessed - Lower Body Bathing: Unsupported sitting;Supported sit to stand Upper Body Dressing: Minimal assistance;Other (comment) (back brace and front opening gown) Where Assessed - Upper Body Dressing: Unsupported sitting Lower Body Dressing: Maximal assistance Where Assessed - Lower Body Dressing: Unsupported sitting;Supported sit to stand Equipment Used: Back brace;Gait belt;Rolling walker Transfers/Ambulation Related to ADLs: supervision  and verbal cues for hand placement with RW. ADL Comments: Pt was able to cross his foot over the opposite knee to wash feet and donn socks, but not able to currently.    OT Diagnosis: Generalized weakness;Acute pain  OT Problem List: Decreased strength;Decreased activity tolerance;Impaired balance (sitting and/or standing);Pain;Decreased knowledge of precautions OT Treatment Interventions: Self-care/ADL training;Patient/family education   OT Goals Acute Rehab OT Goals OT Goal Formulation: With patient Time For Goal Achievement: 10/21/12 Potential to Achieve Goals: Good ADL Goals Pt Will Perform Grooming: with supervision;Standing at sink ADL Goal: Grooming - Progress: Goal set today Pt Will Perform Upper Body Dressing: with modified independence;Other (comment) (back brace) ADL Goal: Upper Body Dressing - Progress: Goal set today Pt Will Transfer to Toilet: with supervision;Comfort height toilet;Grab bars;Maintaining back safety precautions ADL Goal: Toilet Transfer - Progress: Goal set today Pt Will Perform Toileting - Clothing Manipulation: Standing;with supervision ADL Goal: Toileting - Clothing Manipulation - Progress: Goal set today Pt Will Perform Toileting - Hygiene: with modified independence;Sitting on 3-in-1 or toilet ADL Goal: Toileting - Hygiene - Progress: Goal set today Pt Will Perform Tub/Shower Transfer: Shower transfer;with supervision;Grab bars;Ambulation;Shower seat with back;Maintaining back safety precautions;with caregiver independent in assisting ADL Goal: Tub/Shower Transfer - Progress: Goal set today  Visit Information  Last OT Received On: 10/14/12 Assistance Needed: +1 PT/OT Co-Evaluation/Treatment: Yes    Subjective Data  Subjective: "I don't want another ileus like the last back surgery." Patient Stated Goal: Return home with wife.   Prior Functioning     Home Living Lives With: Spouse Available Help at Discharge: Family;Available 24  hours/day Type of Home: House Home Access: Stairs to enter [employer] of Steps: 3 Entrance Stairs-Rails:  (has a pole he can hold) Home Layout: Two level;Able to live on main  level with bedroom/bathroom Bathroom Shower/Tub: Health visitor: Standard Home Adaptive Equipment: Walker - rolling;Grab bars in shower;Grab bars around toilet Prior Function Level of Independence: Independent Able to Take Stairs?: Yes Driving: Yes Vocation: Retired Musician: No difficulties Dominant Hand: Right         Vision/Perception     Cognition  Overall Cognitive Status: Appears within functional limits for tasks assessed/performed Arousal/Alertness: Awake/alert Orientation Level: Appears intact for tasks assessed Behavior During Session: [facility] for tasks performed    Extremity/Trunk Assessment Right Upper Extremity Assessment RUE ROM/Strength/Tone: WFL for tasks assessed RUE Coordination: WFL - gross/fine motor Left Upper Extremity Assessment LUE ROM/Strength/Tone: WFL for tasks assessed LUE Coordination: WFL - gross/fine motor     Mobility Bed Mobility Bed Mobility: Rolling Right;Right Sidelying to Sit;Sitting - Scoot to Edge of Bed Rolling Right: 4: Min guard Right Sidelying to Sit: 4: Min guard Sitting - Scoot to [employer] of Bed: 4: Min guard Details for Bed Mobility Assistance: verbal cues for log roll technique Transfers Transfers: Sit to Stand;Stand to Sit Sit to Stand: 4: Min guard;With upper extremity assist;From bed Stand to Sit: 4: Min guard;With armrests;To chair/3-in-1 Details for Transfer Assistance: verbal cues for hand placement     Shoulder Instructions     Exercise     Balance     End of Session OT - End of Session Activity Tolerance: Patient limited by fatigue;Patient limited by pain Patient left: in chair;with call bell/phone within reach Nurse Communication: Mobility status  GO     Evern Bio 10/14/2012, 3:28 PM [phone]

## 2012-10-14 NOTE — Evaluation (Signed)
Physical Therapy Evaluation Patient Details Name: [name] MRN: [id number] DOB: 04-Nov-1940 Today's Date: 10/14/2012 Time: 1136-1201 PT Time Calculation (min): 25 min  PT Assessment / Plan / Recommendation Clinical Impression  Pt is a pleasent 71 y.o. male s/p revision for lumbar spine sx.  Patient demonstrates some deficits in functional mobility secondary to pain, weakness; sensory issues and decreased activity tolerance. Pt will benefit from continued skilled PT to address deficits and maximize independence for discharge.    PT Assessment  Patient needs continued PT services    Follow Up Recommendations  No PT follow up                Frequency Min 5X/week    Precautions / Restrictions Precautions Precautions: Back;Fall Precaution Booklet Issued: Yes (comment) Precaution Comments: reviewed back precautions Required Braces or Orthoses: Spinal Brace Spinal Brace: Applied in sitting position Restrictions Weight Bearing Restrictions: No   Pertinent Vitals/Pain 4/10      Mobility  Bed Mobility Bed Mobility: Rolling Right;Right Sidelying to Sit;Sitting - Scoot to Edge of Bed Rolling Right: 4: Min guard Right Sidelying to Sit: 4: Min guard Sitting - Scoot to [place] of Bed: 4: Min guard Details for Bed Mobility Assistance: verbal cues for log roll technique Transfers Transfers: Sit to Stand;Stand to Sit Sit to Stand: 4: Min guard;With upper extremity assist;From bed Stand to Sit: 4: Min guard;With armrests;To chair/3-in-1 Details for Transfer Assistance: verbal cues for hand placement Ambulation/Gait Ambulation/Gait Assistance: 4: Min guard Ambulation Distance (Feet): 80 Feet Assistive device: Rolling walker Ambulation/Gait Assistance Details: patient steady with ambulation; VC's initially for upright posture Gait Pattern: Step-through pattern;Decreased stride length Gait velocity: decreased           PT Diagnosis: Difficulty walking;Abnormality of gait;Acute  pain;Generalized weakness  PT Problem List: Decreased strength;Decreased range of motion;Decreased activity tolerance;Decreased balance;Decreased mobility;Pain PT Treatment Interventions: DME instruction;Gait training;Stair training;Functional mobility training;Therapeutic activities;Therapeutic exercise;Patient/family education   PT Goals Acute Rehab PT Goals PT Goal Formulation: With patient Time For Goal Achievement: 10/19/12 Potential to Achieve Goals: Good Pt will go Supine/Side to Sit: with modified independence PT Goal: Supine/Side to Sit - Progress: Goal set today Pt will go Sit to Supine/Side: with modified independence PT Goal: Sit to Supine/Side - Progress: Goal set today Pt will go Sit to Stand: with modified independence PT Goal: Sit to Stand - Progress: Goal set today Pt will Ambulate: >150 feet;with modified independence PT Goal: Ambulate - Progress: Goal set today Pt will Go Up / Down Stairs: 3-5 stairs;with modified independence PT Goal: Up/Down Stairs - Progress: Goal set today  Visit Information  Last PT Received On: 10/14/12 Assistance Needed: +1    Subjective Data  Subjective: I will do anything to make sure I don't have an [place] again Patient Stated Goal: to go home   Prior Functioning  Home Living Lives With: Spouse Available Help at Discharge: Family;Available 24 hours/day Type of Home: House Home Access: Stairs to enter [employer] of Steps: 3 Entrance Stairs-Rails:  (has a pole he can hold) Home Layout: Two level;Able to live on main level with bedroom/bathroom Bathroom Shower/Tub: Health visitor: Standard Home Adaptive Equipment: Walker - rolling;Grab bars in shower;Grab bars around toilet Prior Function Level of Independence: Independent Able to Take Stairs?: Yes Driving: Yes Vocation: Retired Musician: No difficulties Dominant Hand: Right    Cognition  Overall Cognitive Status: Appears within  functional limits for tasks assessed/performed Arousal/Alertness: Awake/alert Orientation Level: Appears intact for tasks assessed  Behavior During Session: [facility] for tasks performed    Extremity/Trunk Assessment Right Upper Extremity Assessment RUE ROM/Strength/Tone: [facility] for tasks assessed RUE Coordination: WFL - gross/fine motor Left Upper Extremity Assessment LUE ROM/Strength/Tone: WFL for tasks assessed LUE Coordination: WFL - gross/fine motor Right Lower Extremity Assessment RLE ROM/Strength/Tone: WFL for tasks assessed RLE Sensation: Deficits;History of peripheral neuropathy RLE Coordination: WFL - gross/fine motor Left Lower Extremity Assessment LLE ROM/Strength/Tone: WFL for tasks assessed LLE Sensation: Deficits;History of peripheral neuropathy LLE Coordination: WFL - gross/fine motor   Balance    End of Session PT - End of Session Equipment Utilized During Treatment: Gait belt;Back brace Activity Tolerance: Patient tolerated treatment well;Patient limited by pain;Patient limited by fatigue Patient left: in chair;with call bell/phone within reach Nurse Communication: Mobility status  GP     Fabio Asa 10/14/2012, 3:48 PM  Charlotte Crumb, PT DPT  [phone]

## 2012-10-14 NOTE — Progress Notes (Signed)
Subjective: Patient reports Minimal nausea moderate back pain. Lower extremity function and  Objective: Vital signs in last 24 hours: Temp:  [97.3 F (36.3 C)-98.8 F (37.1 C)] 97.9 F (36.6 C) (12/18 0646) Pulse Rate:  [61-109] 109  (12/18 0646) Resp:  [7-20] 17  (12/18 0646) BP: (104-134)/(58-74) 115/58 mmHg (12/18 0646) SpO2:  [91 %-100 %] 98 % (12/18 0646)  Intake/Output from previous day: 12/17 0701 - 12/18 0700 In: 3050 [I.V.:2700; IV Piggyback:350] Out: 1975 [Urine:1175; Emesis/NG output:400; Blood:400] Intake/Output this shift:    Trace bowel sounds noted on abdominal examination. Dressing clean and dry. Motor function intact in lower extremities.  Lab Results: No results found for this basename: WBC:2,HGB:2,HCT:2,PLT:2 in the last 72 hours BMET No results found for this basename: NA:2,K:2,CL:2,CO2:2,GLUCOSE:2,BUN:2,CREATININE:2,CALCIUM:2 in the last 72 hours  Studies/Results: Dg Lumbar Spine 2-3 Views  10/14/2012  *RADIOLOGY REPORT*  Clinical Data: L3 - L5 revision  DG C-ARM 1-60 MIN,LUMBAR SPINE - 2-3 VIEW  Comparison:  Lumbar spine CT - 09/22/2012  Findings:  Two spot intraoperative fluoroscopic images of the lumbar spine are provided for review.  Lumbar spine labeling is in keeping with preprocedural lumbar spine CT.  Images demonstrate L3 - L5 paraspinal fusion and intervertebral disc space replacement.  There is improved alignment of the bilateral L3 pedicular screws.  Laminectomy defect is seen at L4. No radiopaque foreign body.  IMPRESSION: L3 - L5 paraspinal fusion/revision with improved alignment of the bilateral L3 pedicular screws.   Original Report Authenticated By: Tacey Ruiz, MD    Dg C-arm 1-60 Min  10/14/2012  *RADIOLOGY REPORT*  Clinical Data: L3 - L5 revision  DG C-ARM 1-60 MIN,LUMBAR SPINE - 2-3 VIEW  Comparison:  Lumbar spine CT - 09/22/2012  Findings:  Two spot intraoperative fluoroscopic images of the lumbar spine are provided for review.  Lumbar  spine labeling is in keeping with preprocedural lumbar spine CT.  Images demonstrate L3 - L5 paraspinal fusion and intervertebral disc space replacement.  There is improved alignment of the bilateral L3 pedicular screws.  Laminectomy defect is seen at L4. No radiopaque foreign body.  IMPRESSION: L3 - L5 paraspinal fusion/revision with improved alignment of the bilateral L3 pedicular screws.   Original Report Authenticated By: Tacey Ruiz, MD     Assessment/Plan: Stable postop day 1. History of postoperative ileus  LOS: 1 day  Remove Foley remove NG tube. Keep patient n.p.o. except for medications and ice chips. Reassess this afternoon   [name] J 10/14/2012, 9:39 AM

## 2012-10-14 NOTE — Progress Notes (Signed)
UR COMPLETED  

## 2012-10-14 NOTE — Clinical Social Work Note (Signed)
Clinical Social Work   CSW reviewed chart and PT is not recommending any PT follow up. CSW will update RNCM. CSW is signing off at this time, as no further needs identified. Please reconsult if a need arises prior to discharge.   Dede Query, MSW, Theresia Majors [phone]

## 2012-10-15 LAB — GLUCOSE, CAPILLARY
Glucose-Capillary: 155 mg/dL — ABNORMAL HIGH (ref 70–99)
Glucose-Capillary: 160 mg/dL — ABNORMAL HIGH (ref 70–99)
Glucose-Capillary: 166 mg/dL — ABNORMAL HIGH (ref 70–99)
Glucose-Capillary: 152 mg/dL — ABNORMAL HIGH (ref 70–99)

## 2012-10-15 MED ORDER — METOCLOPRAMIDE HCL 10 MG PO TABS
10.0000 mg | ORAL_TABLET | Freq: Four times a day (QID) | ORAL | Status: DC | PRN
  Administered 2012-10-15: 10 mg via ORAL
  Filled 2012-10-15: qty 1

## 2012-10-15 MED FILL — Heparin Sodium (Porcine) Inj 1000 Unit/ML: INTRAMUSCULAR | Qty: 30 | Status: AC

## 2012-10-15 MED FILL — Sodium Chloride IV Soln 0.9%: INTRAVENOUS | Qty: 1000 | Status: AC

## 2012-10-15 MED FILL — Sodium Chloride Irrigation Soln 0.9%: Qty: 3000 | Status: AC

## 2012-10-15 NOTE — Progress Notes (Signed)
Subjective: Patient reports No nausea but by the same token he is not hungry. Has walked several times less back pain in legs feel better postop than they have before  Objective: Vital signs in last 24 hours: Temp:  [97.4 F (36.3 C)-99 F (37.2 C)] 98.4 F (36.9 C) (12/19 0205) Pulse Rate:  [76-111] 76  (12/19 0700) Resp:  [16-22] 20  (12/19 0700) BP: (86-122)/(42-60) 97/42 mmHg (12/19 0700) SpO2:  [94 %-99 %] 94 % (12/19 0700) Weight:  [92.53 kg (203 lb 15.9 oz)] 92.53 kg (203 lb 15.9 oz) (12/18 1014)  Intake/Output from previous day: 12/18 0701 - 12/19 0700 In: 33 [P.O.:30; I.V.:3] Out: 300 [Urine:300] Intake/Output this shift:    Incision is clean and dry and dressing has been removed motor function appears good and lower extremities. Bowel sounds are present slightly improved over yesterday  Lab Results: No results found for this basename: WBC:2,HGB:2,HCT:2,PLT:2 in the last 72 hours BMET No results found for this basename: NA:2,K:2,CL:2,CO2:2,GLUCOSE:2,BUN:2,CREATININE:2,CALCIUM:2 in the last 72 hours  Studies/Results: Dg Lumbar Spine 2-3 Views  10/14/2012  *RADIOLOGY REPORT*  Clinical Data: L3 - L5 revision  DG C-ARM 1-60 MIN,LUMBAR SPINE - 2-3 VIEW  Comparison:  Lumbar spine CT - 09/22/2012  Findings:  Two spot intraoperative fluoroscopic images of the lumbar spine are provided for review.  Lumbar spine labeling is in keeping with preprocedural lumbar spine CT.  Images demonstrate L3 - L5 paraspinal fusion and intervertebral disc space replacement.  There is improved alignment of the bilateral L3 pedicular screws.  Laminectomy defect is seen at L4. No radiopaque foreign body.  IMPRESSION: L3 - L5 paraspinal fusion/revision with improved alignment of the bilateral L3 pedicular screws.   Original Report Authenticated By: Tacey Ruiz, MD    Dg C-arm 1-60 Min  10/14/2012  *RADIOLOGY REPORT*  Clinical Data: L3 - L5 revision  DG C-ARM 1-60 MIN,LUMBAR SPINE - 2-3 VIEW   Comparison:  Lumbar spine CT - 09/22/2012  Findings:  Two spot intraoperative fluoroscopic images of the lumbar spine are provided for review.  Lumbar spine labeling is in keeping with preprocedural lumbar spine CT.  Images demonstrate L3 - L5 paraspinal fusion and intervertebral disc space replacement.  There is improved alignment of the bilateral L3 pedicular screws.  Laminectomy defect is seen at L4. No radiopaque foreign body.  IMPRESSION: L3 - L5 paraspinal fusion/revision with improved alignment of the bilateral L3 pedicular screws.   Original Report Authenticated By: Tacey Ruiz, MD     Assessment/Plan: Stable with slow improvement.  LOS: 2 days  Patient to remain on ice chips. Continue IV fluid for hydration. Encourage ambulation. We'll add Reglan to help stimulate bowel activity.   [name] J 10/15/2012, 9:20 AM

## 2012-10-15 NOTE — Progress Notes (Signed)
Physical Therapy Treatment Patient Details Name: TAVIN VERNET MRN: [id number] DOB: September 02, 1941 Today's Date: 10/15/2012 Time: 0920-0946 PT Time Calculation (min): 26 min  PT Assessment / Plan / Recommendation Comments on Treatment Session  Pt is making continuous progress towards PT goals at this time.  Patient ambulated significant distance with minimal breaks.  Patient did become very fatigued following ambulation.  Performed bed mobility with supervision and demonstrates ggood compliance with precautions during all activities.      Follow Up Recommendations  No PT follow up                 Frequency Min 5X/week   Plan Discharge plan remains appropriate    Precautions / Restrictions Precautions Precautions: Back;Fall Precaution Booklet Issued: Yes (comment) Precaution Comments: reviewed back precautions Required Braces or Orthoses: Spinal Brace Spinal Brace: Applied in sitting position Restrictions Weight Bearing Restrictions: No       Mobility  Bed Mobility Bed Mobility: Sit to Sidelying Left;Rolling Right;Rolling Left;Scooting to [facility] Rolling Right: 5: Supervision Sitting - Scoot to Edge of Bed: 5: Supervision Sit to Sidelying Left: 5: Supervision Scooting to [facility]: 5: Supervision Details for Bed Mobility Assistance: verbal cues for log roll technique Transfers Transfers: Sit to Stand;Stand to Sit Sit to Stand: 5: Supervision;From bed Stand to Sit: 5: Supervision;To bed Details for Transfer Assistance: verbal cues for hand placement Ambulation/Gait Ambulation/Gait Assistance: 5: Supervision Ambulation Distance (Feet): 200 Feet Assistive device: Rolling walker Ambulation/Gait Assistance Details: VC's for upright posture within rw Gait Pattern: Step-through pattern;Decreased stride length Gait velocity: decreased General Gait Details: steady with ambulation      PT Goals Acute Rehab PT Goals PT Goal Formulation: With patient Time For Goal Achievement:  10/19/12 Potential to Achieve Goals: Good Pt will go Supine/Side to Sit: with modified independence PT Goal: Supine/Side to Sit - Progress: Progressing toward goal Pt will go Sit to Supine/Side: with modified independence PT Goal: Sit to Supine/Side - Progress: Progressing toward goal Pt will go Sit to Stand: with modified independence PT Goal: Sit to Stand - Progress: Progressing toward goal Pt will Ambulate: >150 feet;with modified independence PT Goal: Ambulate - Progress: Progressing toward goal  Visit Information  Last PT Received On: 10/15/12 Assistance Needed: +1    Subjective Data  Subjective: I haven't eaten yet because I haven't been hungry Patient Stated Goal: to go home   Cognition  Overall Cognitive Status: Appears within functional limits for tasks assessed/performed Arousal/Alertness: Awake/alert Orientation Level: Appears intact for tasks assessed Behavior During Session: [facility] for tasks performed    Balance  Balance Balance Assessed: Yes High Level Balance High Level Balance Activites: Side stepping;Backward walking;Direction changes;Turns;Sudden stops;Head turns High Level Balance Comments: patient very steady with activities   End of Session PT - End of Session Equipment Utilized During Treatment: Gait belt;Back brace Activity Tolerance: Patient tolerated treatment well;Patient limited by fatigue Patient left: with call bell/phone within reach;in bed Nurse Communication: Mobility status   GP     Fabio Asa 10/15/2012, 10:12 AM Charlotte Crumb, PT DPT  [phone]

## 2012-10-16 LAB — GLUCOSE, CAPILLARY
Glucose-Capillary: 118 mg/dL — ABNORMAL HIGH (ref 70–99)
Glucose-Capillary: 134 mg/dL — ABNORMAL HIGH (ref 70–99)
Glucose-Capillary: 134 mg/dL — ABNORMAL HIGH (ref 70–99)
Glucose-Capillary: 126 mg/dL — ABNORMAL HIGH (ref 70–99)
Glucose-Capillary: 114 mg/dL — ABNORMAL HIGH (ref 70–99)
Glucose-Capillary: 193 mg/dL — ABNORMAL HIGH (ref 70–99)

## 2012-10-16 NOTE — Progress Notes (Signed)
Physical Therapy Treatment Patient Details Name: [name] MRN: [id number] DOB: Jun 01, 1941 Today's Date: 10/16/2012 Time: 4098-1191 PT Time Calculation (min): 25 min  PT Assessment / Plan / Recommendation Comments on Treatment Session  Patient is able to increase ambulation well and performed stair negotiation safely. Feel patient will be safe for d/c home once cleared medically. Will continue to see to maximize independence.    Follow Up Recommendations  No PT follow up     Does the patient have the potential to tolerate intense rehabilitation     Barriers to Discharge        Equipment Recommendations       Recommendations for Other Services    Frequency Min 5X/week   Plan Discharge plan remains appropriate    Precautions / Restrictions Precautions Precautions: Back;Fall Precaution Booklet Issued: Yes (comment) Precaution Comments: reviewed back precautions Required Braces or Orthoses: Spinal Brace Spinal Brace: Applied in sitting position Restrictions Weight Bearing Restrictions: No       Mobility  Bed Mobility Bed Mobility: Sit to Sidelying Left;Rolling Right;Rolling Left;Scooting to [facility] Rolling Right: 5: Supervision Sitting - Scoot to Edge of Bed: 5: Supervision Sit to Sidelying Left: 5: Supervision Scooting to [facility]: 5: Supervision Details for Bed Mobility Assistance: still requires min VC's for proper technique sit to side Transfers Transfers: Sit to Stand;Stand to Sit Sit to Stand: 5: Supervision;From chair/3-in-1 Stand to Sit: 5: Supervision;To bed Details for Transfer Assistance: verbal cues for hand placement Ambulation/Gait Ambulation/Gait Assistance: 5: Supervision Ambulation Distance (Feet): 240 Feet Assistive device: Rolling walker Ambulation/Gait Assistance Details: VC's for upright posture Gait Pattern: Step-through pattern;Decreased stride length Gait velocity: decreased General Gait Details: steady with ambulation Stairs: Yes Stairs  Assistance: 5: Supervision Stairs Assistance Details (indicate cue type and reason): Patient had no difficulty with steps; able to perform both step to and reciprical Stair Management Technique: Two rails;Forwards;Step to pattern;Alternating pattern Number of Stairs: 5  (performed x4 trials)      PT Goals Acute Rehab PT Goals PT Goal Formulation: With patient Time For Goal Achievement: 10/19/12 Potential to Achieve Goals: Good Pt will go Supine/Side to Sit: with modified independence PT Goal: Supine/Side to Sit - Progress: Progressing toward goal Pt will go Sit to Supine/Side: with modified independence PT Goal: Sit to Supine/Side - Progress: Progressing toward goal Pt will go Sit to Stand: with modified independence PT Goal: Sit to Stand - Progress: Progressing toward goal Pt will Ambulate: >150 feet;with modified independence PT Goal: Ambulate - Progress: Progressing toward goal Pt will Go Up / Down Stairs: 3-5 stairs;with modified independence PT Goal: Up/Down Stairs - Progress: Progressing toward goal  Visit Information  Last PT Received On: 10/16/12 Assistance Needed: +1    Subjective Data  Subjective: I am feeling much better today; hoping to start eating Patient Stated Goal: to go home   Cognition  Overall Cognitive Status: Appears within functional limits for tasks assessed/performed Arousal/Alertness: Awake/alert Orientation Level: Appears intact for tasks assessed Behavior During Session: [facility] for tasks performed    Balance  Balance Balance Assessed: Yes High Level Balance High Level Balance Activites: Side stepping;Backward walking;Direction changes;Turns;Sudden stops;Head turns High Level Balance Comments: patient very steady with activities   End of Session PT - End of Session Equipment Utilized During Treatment: Gait belt;Back brace Activity Tolerance: Patient tolerated treatment well;Patient limited by fatigue Patient left: with call bell/phone within  reach;in bed Nurse Communication: Mobility status   GP     Fabio Asa 10/16/2012, 11:28 AM  Alben Deeds, [place] DPT  [phone]

## 2012-10-16 NOTE — Progress Notes (Signed)
Patient ID: [name], male   DOB: [birth year], 71 y.o.   MRN: [id number] Vital signs are stable. Patient is awake alert. Patient states he is hungry  Bowel sounds are present and more active today than they have in the past. His incision is clean and dry.  At this time what the patient he did see how he does with progression of physical activity. Patient has been able to walk the entire length of the hospital floor without difficulty. Hopeful for discharge home tomorrow.

## 2012-10-17 LAB — GLUCOSE, CAPILLARY
Glucose-Capillary: 168 mg/dL — ABNORMAL HIGH (ref 70–99)
Glucose-Capillary: 176 mg/dL — ABNORMAL HIGH (ref 70–99)
Glucose-Capillary: 178 mg/dL — ABNORMAL HIGH (ref 70–99)
Glucose-Capillary: 211 mg/dL — ABNORMAL HIGH (ref 70–99)

## 2012-10-17 MED ORDER — DIAZEPAM 5 MG PO TABS: 5.0000 mg | ORAL_TABLET | Freq: Four times a day (QID) | ORAL | Status: DC | PRN

## 2012-10-17 MED ORDER — HYDROCODONE-ACETAMINOPHEN 7.5-325 MG PO TABS: 1.0000 | ORAL_TABLET | Freq: Four times a day (QID) | ORAL | Status: DC | PRN

## 2012-10-17 NOTE — Progress Notes (Signed)
Occupational Therapy Treatment and Discharge Patient Details Name: [name] MRN: [id number] DOB: 09/06/1941 Today's Date: 10/17/2012 Time: 7829-5621 OT Time Calculation (min): 23 min  OT Assessment / Plan / Recommendation Comments on Treatment Session All education completed with this 71 yo back fusion patient, acute OT will sign off.    Follow Up Recommendations  No OT follow up;Supervision - Intermittent       Equipment Recommendations  None recommended by OT       Frequency Min 2X/week   Plan Discharge plan remains appropriate    Precautions / Restrictions Precautions Precautions: Back Precaution Comments: Pt independently verbalized all 3 back precautions Required Braces or Orthoses: Spinal Brace Spinal Brace: Lumbar corset;Applied in sitting position Restrictions Weight Bearing Restrictions: No   Pertinent Vitals/Pain 2/10 back    ADL  Toilet Transfer: Modified independent Toilet Transfer Method: Sit to stand Toilet Transfer Equipment: Regular height toilet;Grab bars (simulated with built in shower bench) Toileting - Clothing Manipulation and Hygiene: Performed;Modified independent (without AE) Where Assessed - Toileting Clothing Manipulation and Hygiene: Sit to stand from 3-in-1 or toilet Equipment Used: Back brace;Rolling walker ADL Comments: Pt able to verbalize and follow all back precautions      OT Goals ADL Goals ADL Goal: Toileting - Clothing Manipulation - Progress: Met ADL Goal: Toileting - Hygiene - Progress: Met ADL Goal: Tub/Shower Transfer - Progress: Met  Visit Information  Last OT Received On: 10/17/12 Assistance Needed: +1          Cognition  Overall Cognitive Status: Appears within functional limits for tasks assessed/performed Arousal/Alertness: Awake/alert Orientation Level: Appears intact for tasks assessed Behavior During Session: [facility] for tasks performed    Mobility  Transfers Sit to Stand: 6: Modified independent  (Device/Increase time);With upper extremity assist;With armrests;From chair/3-in-1;From bed Stand to Sit: 6: Modified independent (Device/Increase time);With upper extremity assist;With armrests;To bed;To chair/3-in-1             End of Session OT - End of Session Equipment Utilized During Treatment: Back brace Activity Tolerance: Patient tolerated treatment well Patient left: in chair;with call bell/phone within reach;with family/visitor present (wife)       Evette Georges [phone] 10/17/2012, 11:02 AM

## 2012-10-17 NOTE — Discharge Summary (Signed)
Physician Discharge Summary  Patient ID: [name] MRN: [id number] DOB/AGE: [birth year] 71 y.o.  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 Concern for postoperative ileus was addressed initially with an NG tube patient was maintained n.p.o. for the first 72 hours postop when bowel sounds returned as fat and he tolerated this well his back pain has been under good control with minimal narcotic analgesic.  Consults: None  Significant Diagnostic Studies: CT scan of lumbar  Treatments: Revision arthrodesis at L3-L4 with replacement of hardware posterior lateral arthrodesis using allograft and bone morphogenic protein.  Discharge Exam: Blood pressure 125/60, pulse 78, temperature 98.3 F (36.8 C), temperature source Oral, resp. rate 18, height 6\' 1"  (1.854 m), weight 92.53 kg (203 lb 15.9 oz), SpO2 98.00%. Motor function in proved in lower extremities with more distal strength in right lower extremity less pain. Incision is clean and dry  Disposition: 01-Home or Self Care  Discharge Orders    Future Orders Please Complete By Expires   Diet - low sodium heart healthy      Increase activity slowly      Discharge instructions      Comments:   Okay to shower. Do not apply salves or appointments to incision. No heavy lifting with the upper extremities greater than 15 pounds. May resume driving when not requiring pain medication and patient feels comfortable with doing so.   Call MD for:  redness, tenderness, or signs of infection (pain, swelling, redness, odor or  green/yellow discharge around incision site)      Call MD for:  severe uncontrolled pain      Call MD for:  temperature >100.4          Medication List     As of 10/17/2012 10:18 AM    TAKE these medications         aspirin EC 81 MG tablet   Take 81 mg by mouth daily.      atorvastatin 10 MG tablet   Commonly known as: LIPITOR   Take 10 mg by mouth daily.      CoQ-10 100 MG Caps   Take 3 capsules by mouth daily.      diazepam 5 MG tablet   Commonly known as: VALIUM   Take 1 tablet (5 mg total) by mouth every 6 (six) hours as needed (Muscle spasm).      finasteride 1 MG tablet   Commonly known as: PROPECIA   Take 1 mg by mouth daily.      furosemide 40 MG tablet   Commonly known as: LASIX   Take 40 mg by mouth daily.      HYDROcodone-acetaminophen 7.5-325 MG per tablet   Commonly known as: NORCO   Take 1 tablet by mouth every 6 (six) hours as needed for pain.      KLOR-CON M20 20 MEQ tablet   Generic drug: potassium chloride SA   Take 20 mEq by mouth daily.      metFORMIN 500 MG 24 hr tablet   Commonly known as: GLUCOPHAGE-XR   Take 500 mg by mouth 2 (two) times daily with breakfast and lunch.  metoprolol tartrate 25 MG tablet   Commonly known as: LOPRESSOR   Take 25 mg by mouth 2 (two) times daily.      multivitamin with minerals Tabs   Take 1 tablet by mouth daily.      omeprazole 20 MG capsule   Commonly known as: PRILOSEC   Take 20 mg by mouth daily.      pantoprazole 40 MG tablet   Commonly known as: PROTONIX   Take 40 mg by mouth daily.      ramipril 10 MG capsule   Commonly known as: ALTACE   Take 10 mg by mouth daily.      vitamin B-12 1000 MCG tablet   Commonly known as: CYANOCOBALAMIN   Take 1,000 mcg by mouth every other day.         SignedStefani Dama 10/17/2012, 10:18 AM

## 2012-10-17 NOTE — Progress Notes (Signed)
Physical Therapy Treatment Patient Details Name: [name] MRN: [id number] DOB: 1941/06/02 Today's Date: 10/17/2012 Time: 7829-5621 PT Time Calculation (min): 18 min  PT Assessment / Plan / Recommendation Comments on Treatment Session  Pt cont's to mobilize at level to safely d/c home.      Follow Up Recommendations  No PT follow up     Does the patient have the potential to tolerate intense rehabilitation     Barriers to Discharge        Equipment Recommendations       Recommendations for Other Services    Frequency Min 5X/week   Plan Discharge plan remains appropriate    Precautions / Restrictions Precautions Precautions: Back;Fall Precaution Comments: Pt independently verbalized all 3 back precautions Required Braces or Orthoses: Spinal Brace Spinal Brace: Applied in sitting position Restrictions Weight Bearing Restrictions: No   Pertinent Vitals/Pain 2/10  Back     Mobility  Bed Mobility Bed Mobility: Rolling Right;Right Sidelying to Sit;Sit to Sidelying Right Rolling Right: 6: Modified independent (Device/Increase time) Right Sidelying to Sit: 6: Modified independent (Device/Increase time);HOB flat Sitting - Scoot to Edge of Bed: 6: Modified independent (Device/Increase time) Sit to Sidelying Right: 6: Modified independent (Device/Increase time);HOB flat Details for Bed Mobility Assistance: Pt performed well.  No physical (A) or verbal cues needed.   Transfers Transfers: Sit to Stand;Stand to Sit Sit to Stand: 6: Modified independent (Device/Increase time);With upper extremity assist;With armrests;From chair/3-in-1;From bed Stand to Sit: 6: Modified independent (Device/Increase time);With upper extremity assist;With armrests;To bed;To chair/3-in-1 Ambulation/Gait Ambulation/Gait Assistance: 5: Supervision Ambulation Distance (Feet): 400 Feet Assistive device: Rolling walker Ambulation/Gait Assistance Details: Cues to reinforce upright posture & encouragement  to relax UE's.   Gait Pattern: Step-through pattern;Decreased stride length (shoulders rounded forwards) Stairs: Yes Stairs Assistance: 6: Modified independent (Device/Increase time) Stairs Assistance Details (indicate cue type and reason): Cont's to negotiate stairs very well.   Stair Management Technique: Two rails;Alternating pattern;Forwards Number of Stairs: 5  Wheelchair Mobility Wheelchair Mobility: No      PT Goals Acute Rehab PT Goals Time For Goal Achievement: 10/19/12 Potential to Achieve Goals: Good Pt will go Supine/Side to Sit: with modified independence PT Goal: Supine/Side to Sit - Progress: Met Pt will go Sit to Supine/Side: with modified independence PT Goal: Sit to Supine/Side - Progress: Met Pt will go Sit to Stand: with modified independence PT Goal: Sit to Stand - Progress: Met Pt will Ambulate: >150 feet;with modified independence PT Goal: Ambulate - Progress: Progressing toward goal Pt will Go Up / Down Stairs: 3-5 stairs;with modified independence PT Goal: Up/Down Stairs - Progress: Met  Visit Information  Last PT Received On: 10/17/12 Assistance Needed: +1    Subjective Data  Patient Stated Goal: to go home   Cognition  Overall Cognitive Status: Appears within functional limits for tasks assessed/performed Arousal/Alertness: Awake/alert Orientation Level: Appears intact for tasks assessed Behavior During Session: [facility] for tasks performed    Balance     End of Session PT - End of Session Equipment Utilized During Treatment: Gait belt;Back brace Activity Tolerance: Patient tolerated treatment well;Patient limited by fatigue Patient left: with call bell/phone within reach;in bed     Verdell Face, PTA [phone] 10/17/2012

## 2012-10-27 LAB — CULTURE, BLOOD (ROUTINE X 2)
Culture: NO GROWTH
Culture: NO GROWTH

## 2012-12-11 ENCOUNTER — Other Ambulatory Visit: Payer: Self-pay | Admitting: Neurological Surgery

## 2012-12-12 ENCOUNTER — Other Ambulatory Visit: Payer: Self-pay

## 2013-03-14 IMAGING — CR DG CHEST 2V
2 series · 2 of 2 positions shown · non-contrast
Comparison: CT 11/27/2010

CLINICAL DATA: Cough, chest pain

CHEST - 2 VIEW

[w chest pa]
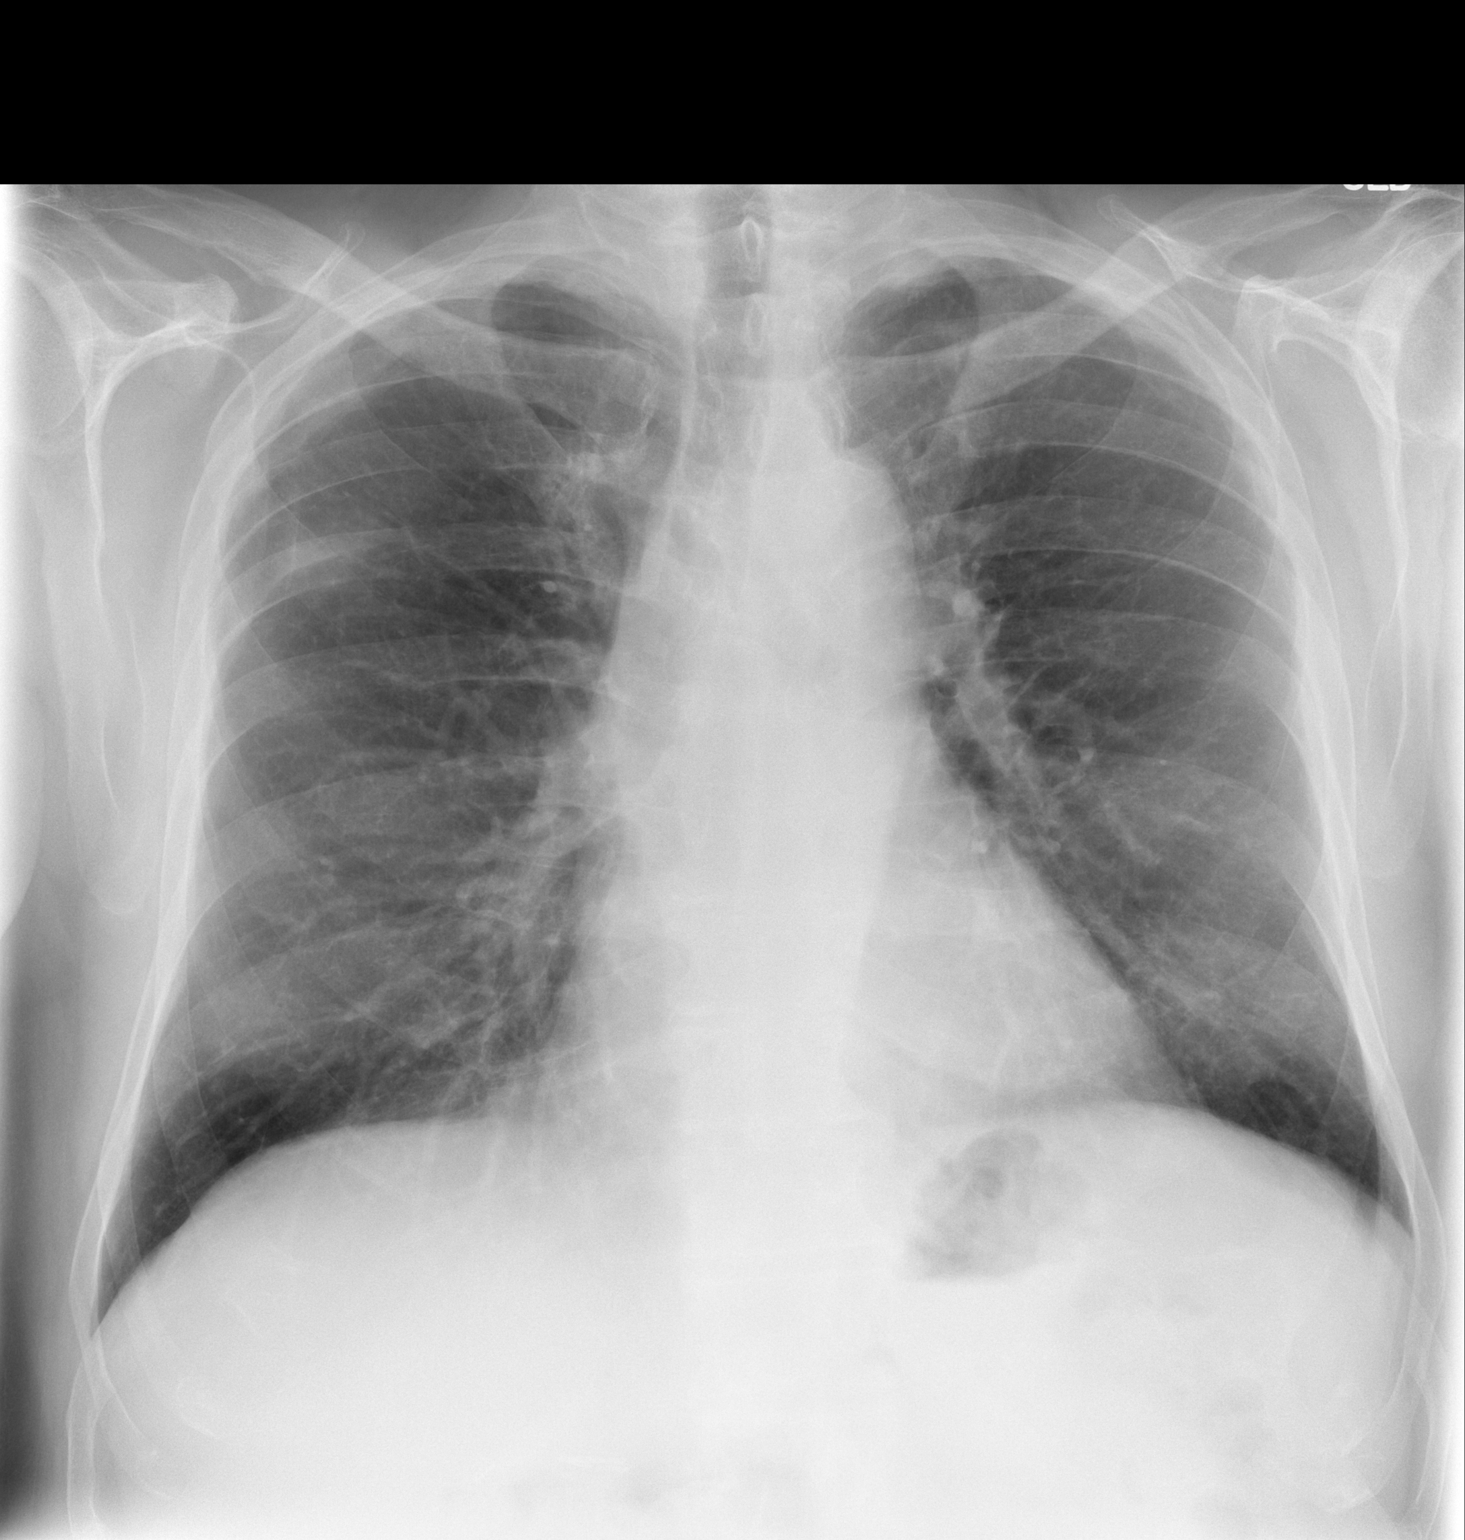

[w chest lat]
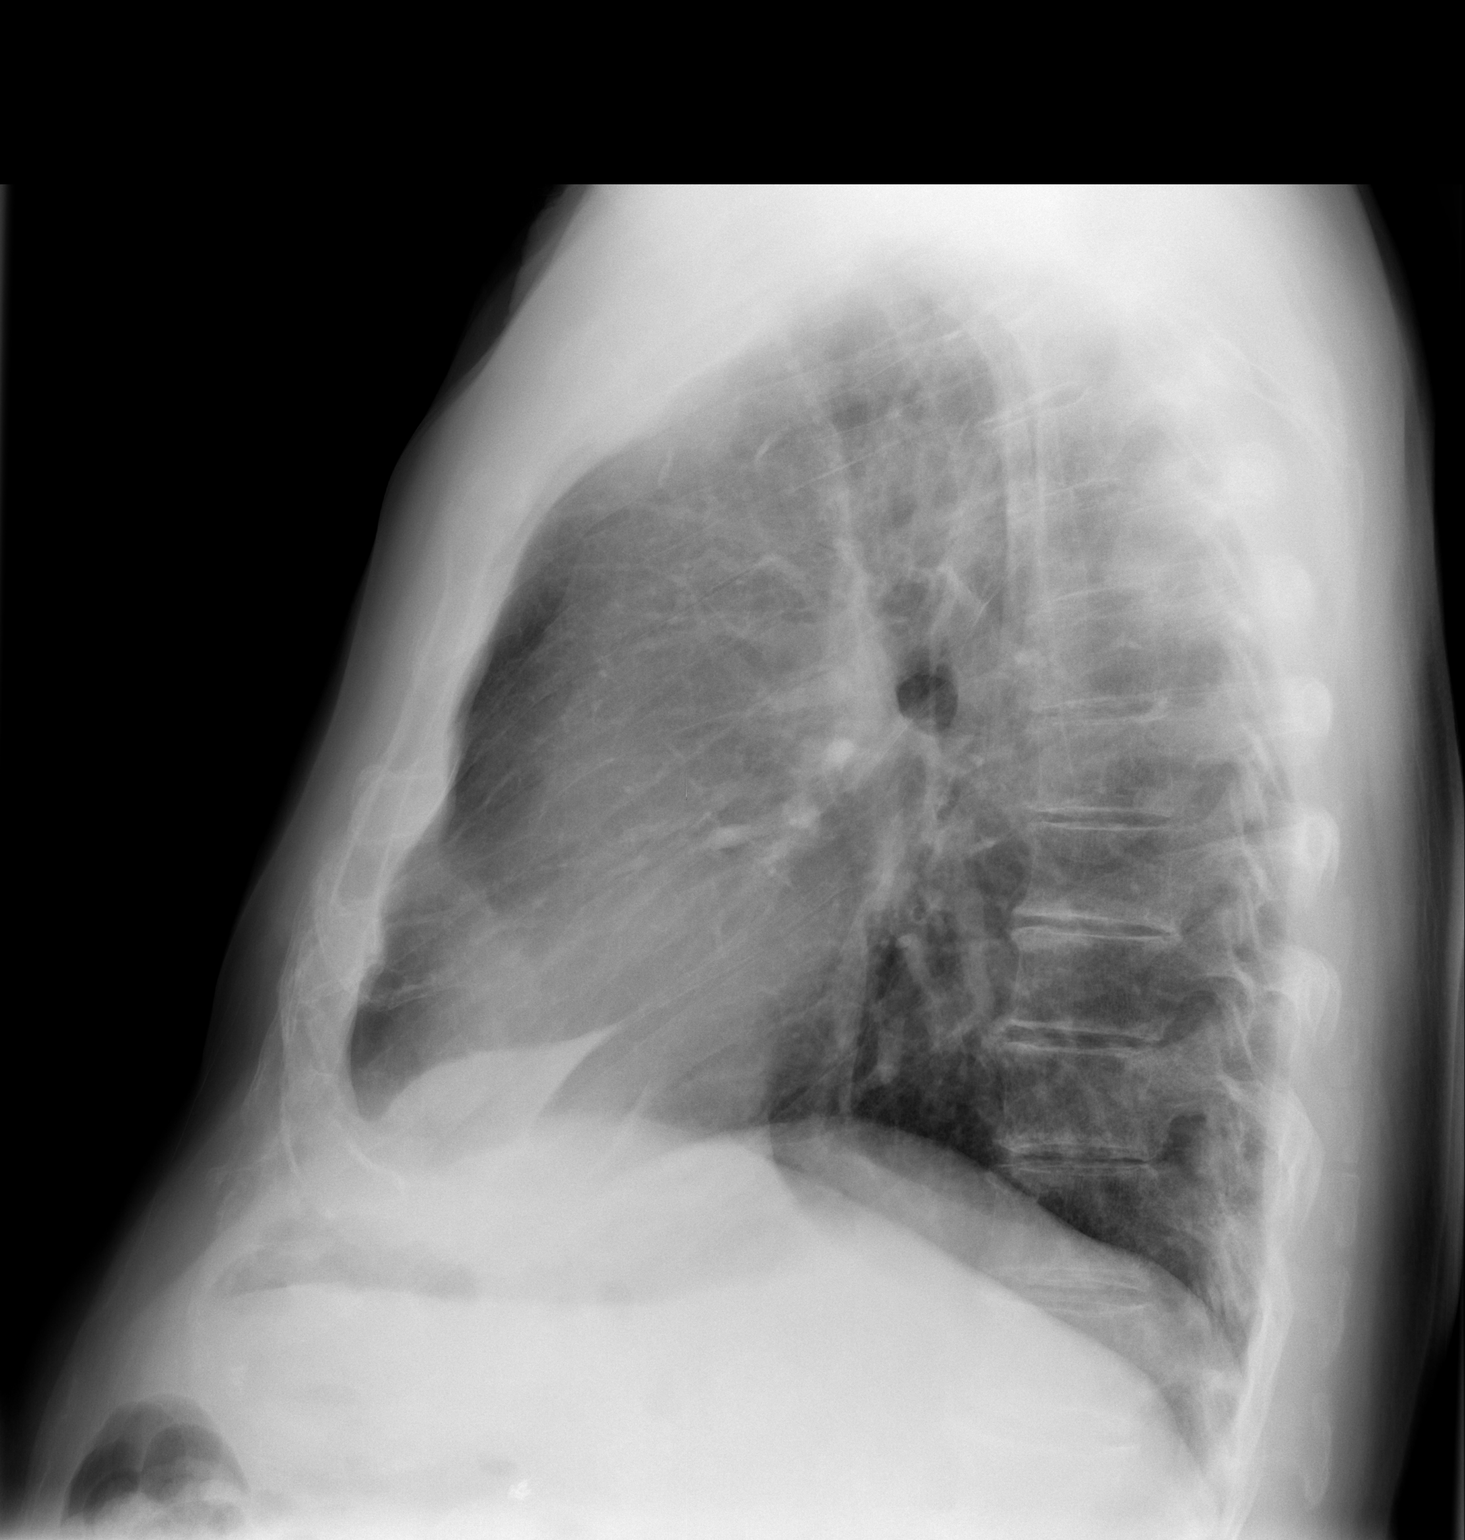

[2 of 2 positions shown; findings below may reference images not displayed]

FINDINGS: Vascular clips in the right upper abdomen. Lungs clear.
Heart size and pulmonary vascularity normal.  No effusion.
Visualized bones unremarkable.
IMPRESSION: No acute disease

## 2013-03-14 IMAGING — CT CT ANGIO CHEST
2 of 6 series · 18 of 46 positions shown · IV contrast (APPLIED)
Comparison: 11/27/2010 CT, 08/08/2011 radiograph

CLINICAL DATA: Shortness of breath

CT ANGIOGRAPHY CHEST WITH CONTRAST
TECHNIQUE: Multidetector CT imaging of the chest was performed
using the standard protocol during bolus administration of
intravenous contrast.  Multiplanar CT image reconstructions
including MIPs were obtained to evaluate the vascular anatomy.
Contrast: 100mL OMNIPAQUE IOHEXOL 300 MG/ML IV SOLN

[Series 8: pulm embolism 1.0 b25f thin · axial · 0.86mm/px · z∈[+1039,+1354]mm · 15 of 345 slices shown]
[im 15/345  lung]
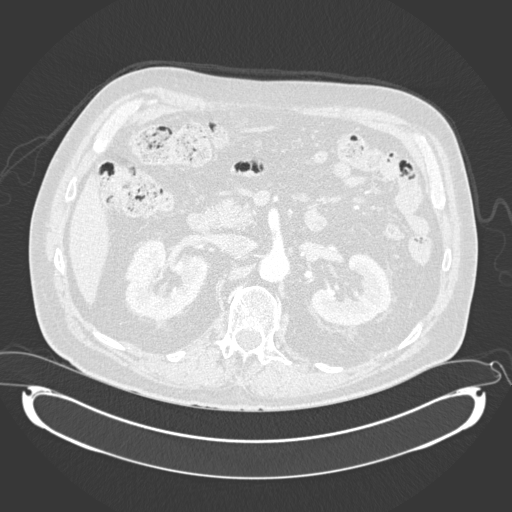
[im 45/345  soft-tissue]
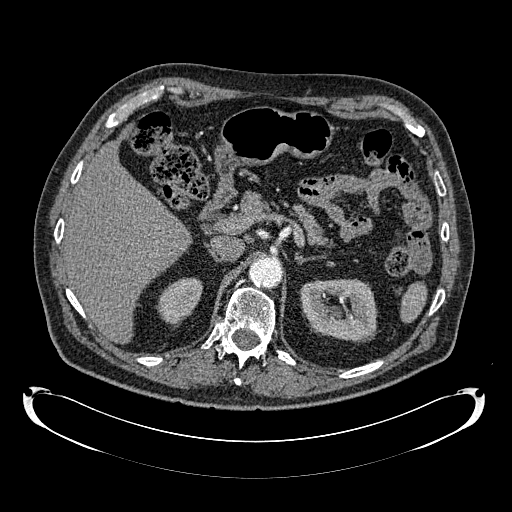
[im 60/345  lung]
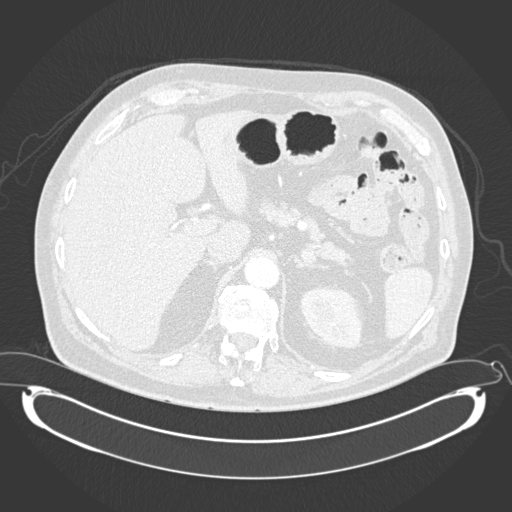
[im 90/345  soft-tissue]
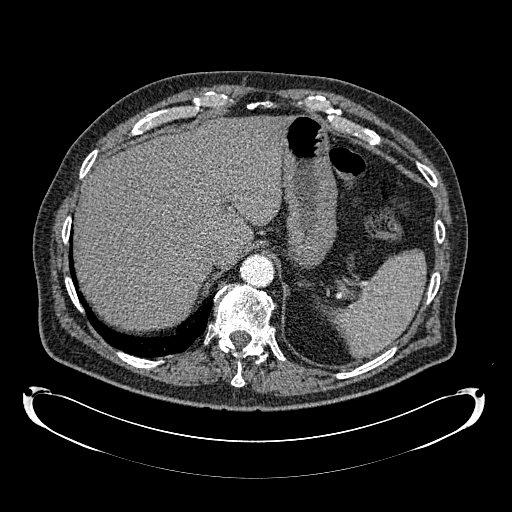
[im 105/345  lung]
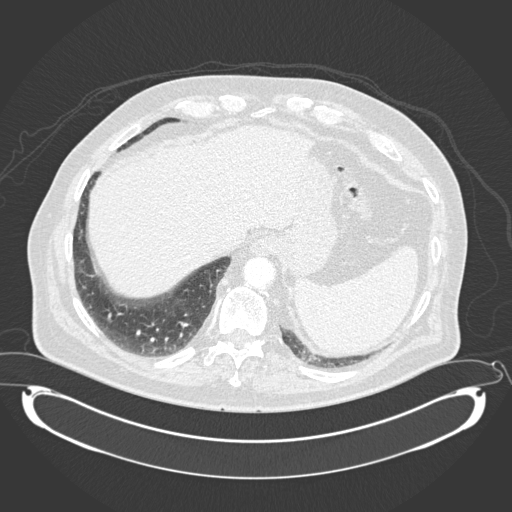
[im 135/345  soft-tissue]
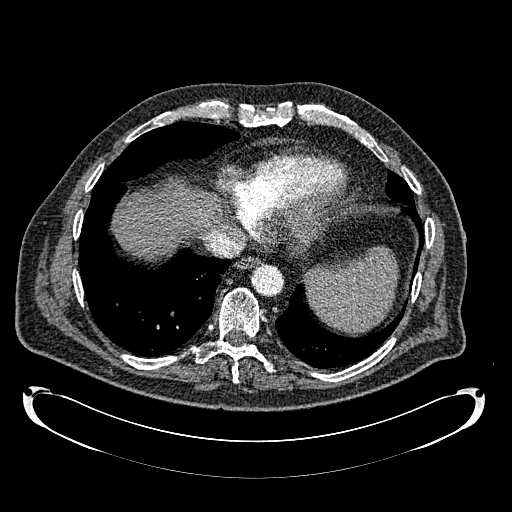
[im 150/345  lung]
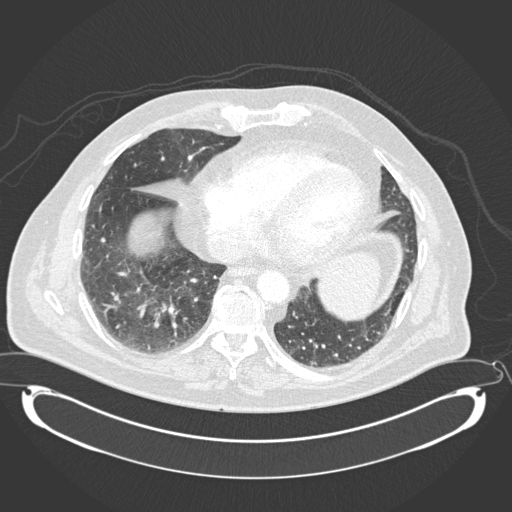
[im 180/345  soft-tissue]
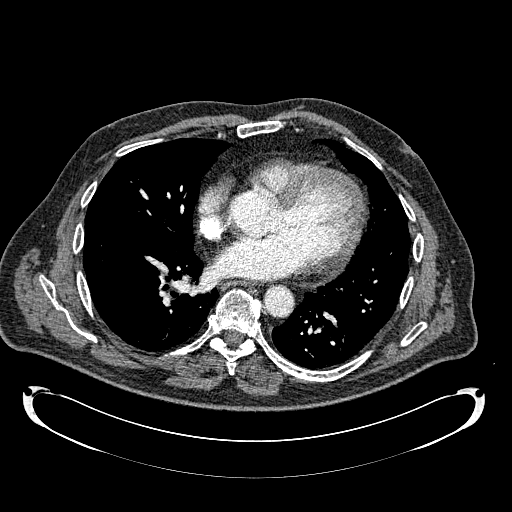
[im 195/345  lung]
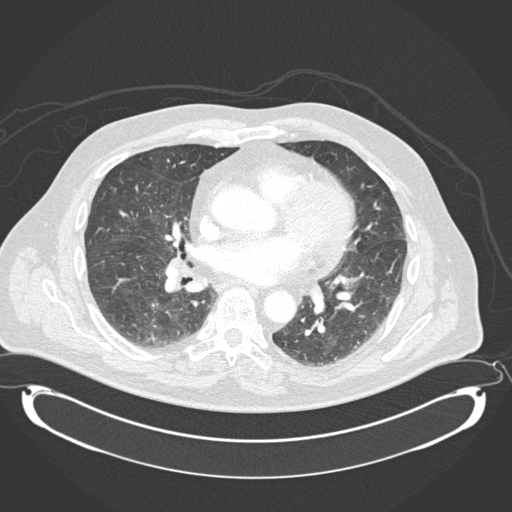
[im 210/345  soft-tissue]
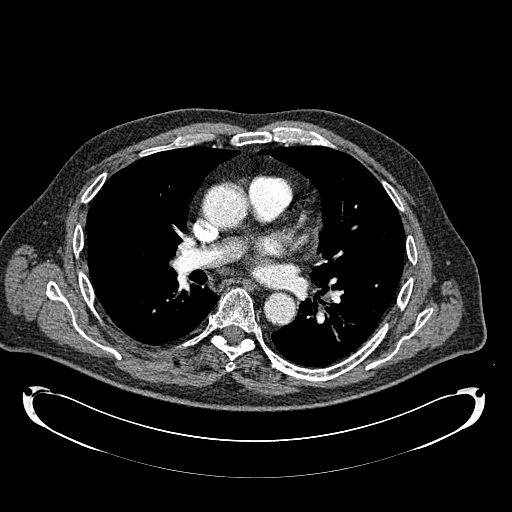
[im 240/345  lung]
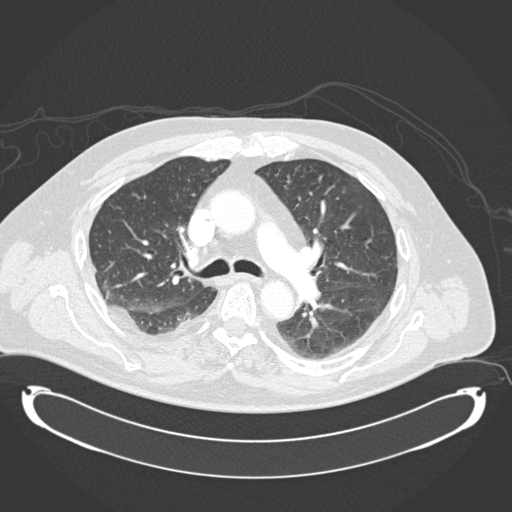
[im 255/345  soft-tissue]
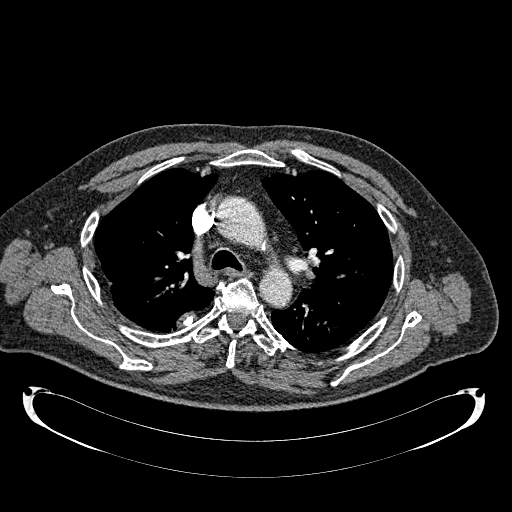
[im 285/345  lung]
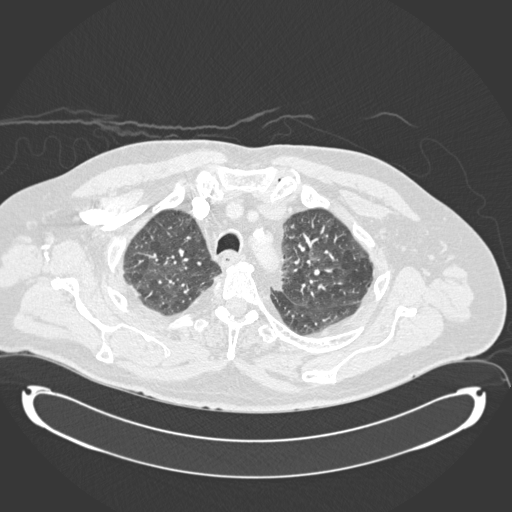
[im 300/345  soft-tissue]
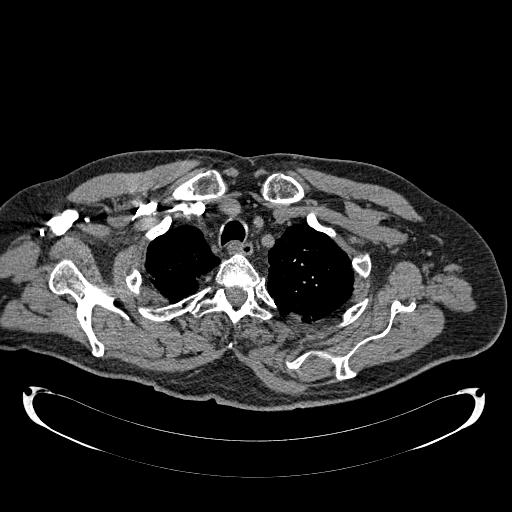
[im 330/345  lung]
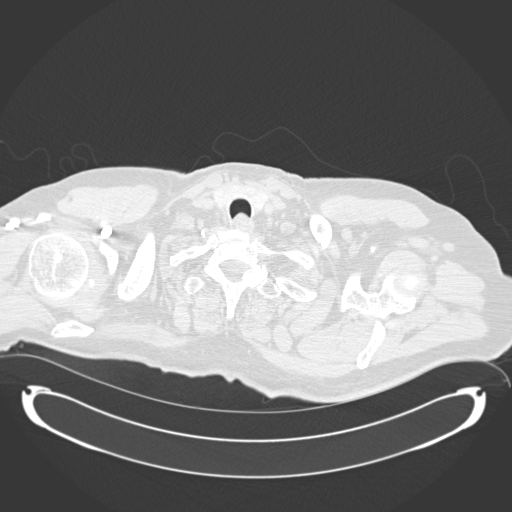

[Series 602: coronal · coronal · 0.86mm/px · 3 of 120 slices shown]
[im 30/120  soft-tissue]
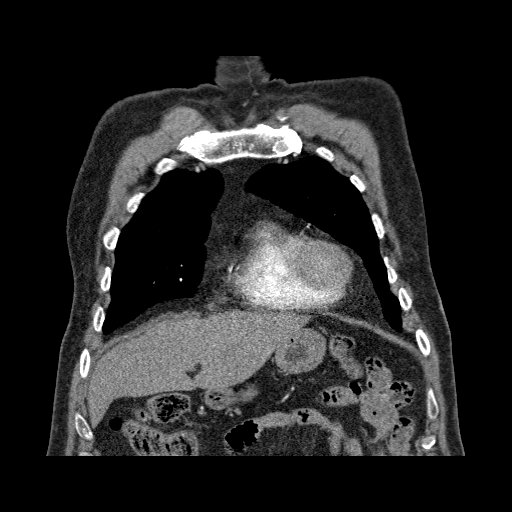
[im 60/120  soft-tissue]
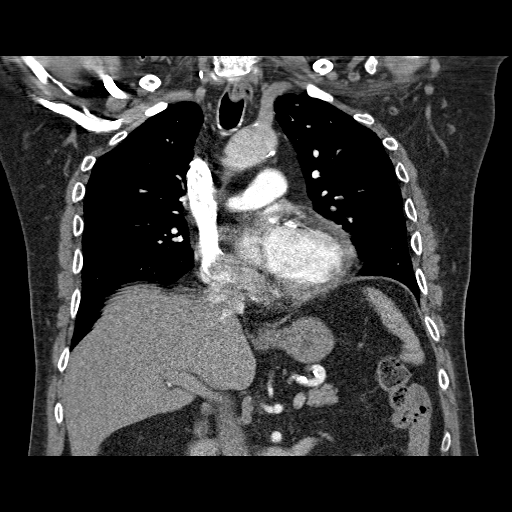
[im 90/120  soft-tissue]
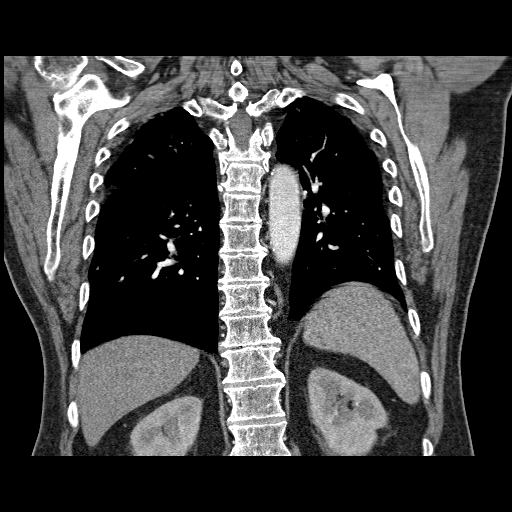

[18 of 46 positions shown; findings below may reference images not displayed]

FINDINGS: There is adequate opacification of the pulmonary
vasculature demonstrate no filling defects to suggest acute
pulmonary emboli.  Heart size is upper normal limits to mildly
enlarged.  Coronary artery calcification present.  Mild ectasia of
the ascending aorta.  Scattered areas of atherosclerotic
calcification.  No dissection flap.  Trace pericardial fluid versus
thickening.  No pleural effusions.

Limited images through the upper abdomen demonstrate nonspecific
bilateral perinephric fat stranding.  Too small to further
characterize hypodensity within the right kidney.  Status post
cholecystectomy.

No intrathoracic lymphadenopathy.

Central airways are patent. Biapical scarring.  Bilateral areas of
mosaic attenuation, in keeping with a air trapping/small airway
disease.  Calcified pleural plaque along the posterior medial right
chest wall at the level of the carina, without significant interval
change.  There is a 1 cm nodule within the left lower lobe, just
above the level of the diaphragm, unchanged.  There is a smaller
nodule on the right, measuring up to 5 mm (image 62).

Multilevel degenerative changes of the imaged spine. No acute or
aggressive appearing osseous lesion.

Review of the MIP images confirms the above findings.
IMPRESSION: No pulmonary embolism identified.

There is a mosaic attenuation, favored to represent air
trapping/small airway disease.

Unchanged pleural based calcified plaque along the posterior right
upper lung.

Bilateral lower lobe nodules, measuring up to 1 cm on the left,
unchanged since [DATE].

## 2013-04-13 ENCOUNTER — Encounter: Payer: Self-pay | Admitting: *Deleted

## 2013-04-13 ENCOUNTER — Encounter: Payer: Medicare Other | Attending: Internal Medicine | Admitting: *Deleted

## 2013-04-13 VITALS — Ht 72.0 in | Wt 215.5 lb

## 2013-04-13 DIAGNOSIS — E119 Type 2 diabetes mellitus without complications: Secondary | ICD-10-CM | POA: Insufficient documentation

## 2013-04-13 DIAGNOSIS — E1165 Type 2 diabetes mellitus with hyperglycemia: Secondary | ICD-10-CM

## 2013-04-13 DIAGNOSIS — Z713 Dietary counseling and surveillance: Secondary | ICD-10-CM | POA: Insufficient documentation

## 2013-04-13 DIAGNOSIS — E1149 Type 2 diabetes mellitus with other diabetic neurological complication: Secondary | ICD-10-CM

## 2013-04-13 NOTE — Progress Notes (Signed)
  Medical Nutrition Therapy:  Appt start time: 1500 end time:  1600.  Assessment:  Primary concerns today: patient here to start home glucose monitoring and diabetes education including update on nutrition. He is here with his wife. States history of diabetes for last 8 years but has never checked his BG. His A1c on 03/11/2013 was up to 8.7% so his MD recommended he come for diabetes education and to start checking his BG at home.  MEDICATIONS: see list   DIETARY INTAKE: Usual eating pattern includes 3 meals and 0-2 snacks per day.  Everyday foods include good variety of all food groups.  Avoided foods include fatty and high calorie foods.    Usual physical activity: typically enjoys playing golf, has fairly active lifestyle  Estimated energy needs: 1800 calories 200 g carbohydrates 135 g protein 50 g fat  Progress Towards Goal(s):  In progress.   Nutritional Diagnosis:  NB-1.1 Food and nutrition-related knowledge deficit As related to diabetes management.  As evidenced by A1c of 8.7% on 03/11/2013.    Intervention:  Nutrition counseling and diabetes education initiated. Discussed basic physiology of diabetes, SMBG and rationale of checking BG at alternate times of day, A1c, Carb Counting and reading food labels, and benefits of increased activity. Also provided him with AccuChek Nano Meter Lot # [id number] and Expiration Date of 07/27/2014. Instructed him on use of the meter and he successfully returned demonstration of use of meter. Plan:  Aim for 4 Carb Choices per meal (60 grams) +/- 1 either way  Aim for 0-2 Carbs per snack if hungry  Consider reading food labels for Total Carbohydrate and Fat Grams of foods Consider  increasing your activity level by playing golf or walking for 30 minutes daily as tolerated Consider checking BG at alternate times per day as directed by MD    Handouts given during visit include: Living Well with Diabetes Carb Counting and Food Label handouts Meal  Plan Card  Monitoring/Evaluation:  Dietary intake, exercise, reading food labels, and body weight in 4 week(s).

## 2013-04-19 ENCOUNTER — Encounter: Payer: Self-pay | Admitting: *Deleted

## 2013-04-19 NOTE — Patient Instructions (Signed)
Plan:  Aim for 4 Carb Choices per meal (60 grams) +/- 1 either way  Aim for 0-2 Carbs per snack if hungry  Consider reading food labels for Total Carbohydrate and Fat Grams of foods Consider  increasing your activity level by playing golf or walking for 30 minutes daily as tolerated Consider checking BG at alternate times per day as directed by MD

## 2013-04-21 ENCOUNTER — Other Ambulatory Visit: Payer: Self-pay | Admitting: Neurological Surgery

## 2013-04-21 ENCOUNTER — Other Ambulatory Visit (HOSPITAL_COMMUNITY): Payer: Self-pay | Admitting: Neurological Surgery

## 2013-04-21 DIAGNOSIS — M5416 Radiculopathy, lumbar region: Secondary | ICD-10-CM

## 2013-05-10 ENCOUNTER — Encounter (HOSPITAL_COMMUNITY): Payer: Self-pay | Admitting: Respiratory Therapy

## 2013-05-13 ENCOUNTER — Ambulatory Visit (HOSPITAL_COMMUNITY)
Admission: RE | Admit: 2013-05-13 | Discharge: 2013-05-13 | Disposition: A | Discharge status: Home / Self Care | Payer: Medicare Other | Source: Ambulatory Visit | Attending: Neurological Surgery | Admitting: Neurological Surgery

## 2013-05-13 DIAGNOSIS — M5416 Radiculopathy, lumbar region: Secondary | ICD-10-CM

## 2013-05-13 DIAGNOSIS — M47817 Spondylosis without myelopathy or radiculopathy, lumbosacral region: Secondary | ICD-10-CM | POA: Insufficient documentation

## 2013-05-13 DIAGNOSIS — Z981 Arthrodesis status: Secondary | ICD-10-CM | POA: Insufficient documentation

## 2013-05-13 DIAGNOSIS — R209 Unspecified disturbances of skin sensation: Secondary | ICD-10-CM | POA: Insufficient documentation

## 2013-05-13 MED ORDER — DIAZEPAM 5 MG PO TABS: 10.0000 mg | ORAL_TABLET | Freq: Once | ORAL | Status: DC

## 2013-05-13 MED ORDER — ONDANSETRON HCL 4 MG/2ML IJ SOLN: 4.0000 mg | Freq: Four times a day (QID) | INTRAMUSCULAR | Status: DC | PRN

## 2013-05-13 MED ORDER — OXYCODONE-ACETAMINOPHEN 5-325 MG PO TABS
1.0000 | ORAL_TABLET | Freq: Once | ORAL | Status: AC
  Administered 2013-05-13: 2 via ORAL

## 2013-05-13 MED ORDER — IOHEXOL 180 MG/ML  SOLN
20.0000 mL | Freq: Once | INTRAMUSCULAR | Status: AC | PRN
  Administered 2013-05-13: 14 mL via INTRATHECAL

## 2013-05-13 MED ORDER — OXYCODONE-ACETAMINOPHEN 5-325 MG PO TABS
1.0000 | ORAL_TABLET | ORAL | Status: DC | PRN
  Administered 2013-05-13: 2 via ORAL
  Filled 2013-05-13: qty 2

## 2013-05-13 MED ORDER — DIAZEPAM 5 MG PO TABS
ORAL_TABLET | ORAL | Status: AC
  Administered 2013-05-13: 10 mg
  Filled 2013-05-13: qty 2

## 2013-05-13 MED ORDER — OXYCODONE-ACETAMINOPHEN 5-325 MG PO TABS
ORAL_TABLET | ORAL | Status: AC
  Administered 2013-05-13: 2 via ORAL
  Filled 2013-05-13: qty 2

## 2013-05-13 NOTE — Procedures (Signed)
The patient is a 72 year old individual who has undergone an L3-L5 decompression and fusion initially performed last year. The patient developed loosening of the hardware and required revision approximately 6 months ago. He has been plagued with chronic right lumbar radiculopathy. Having failed efforts at conservative management I advised that we perform a myelogram and a postmyelogram CAT scan to better elucidate the nature of his ongoing chronic back and right buttock and leg pain.  Pre op Dx: Lumbar spondylosis and radiculopathy status post arthrodesis L3-L5 Post op Dx: Lumbar spondylosis and radiculopathy status post arthrodesis L3-L5 Procedure: Lumbar myelogram Surgeon: Pike Scantlebury Puncture level: L2-L3 Fluid color: Clear colorless Injection: 16 cc iohexol 180 Findings: No block to the flow of dye. Diffuse spondylosis noted above and below arthrodesis L3-L5

## 2013-06-02 ENCOUNTER — Other Ambulatory Visit: Payer: Self-pay

## 2013-08-07 IMAGING — CR DG ABDOMEN ACUTE W/ 1V CHEST
4 series · 4 of 4 positions shown · non-contrast
Comparison: CT chest from 08/08/2011.  Chest x-ray from 08/08/2011.

CLINICAL DATA: Some diffuse abdominal pain with nausea vomiting.
In the left

ACUTE ABDOMEN SERIES (ABDOMEN 2 VIEW & CHEST 1 VIEW)

[x chest ap]
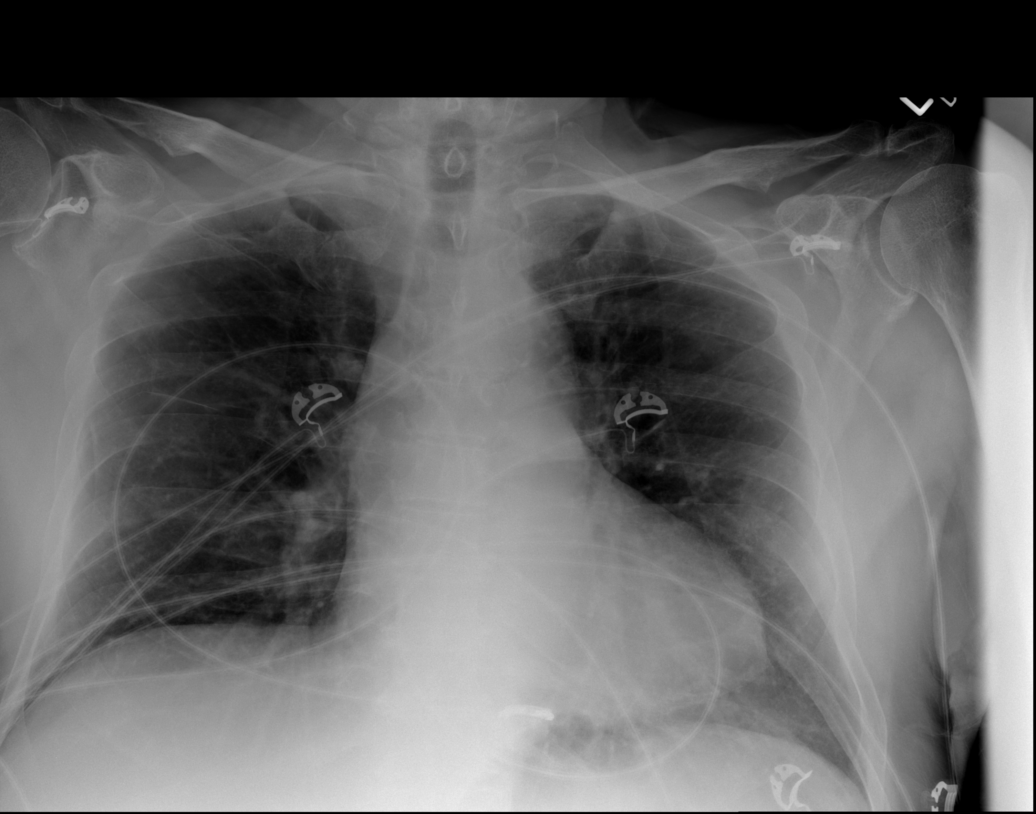

[x abdomen supine]
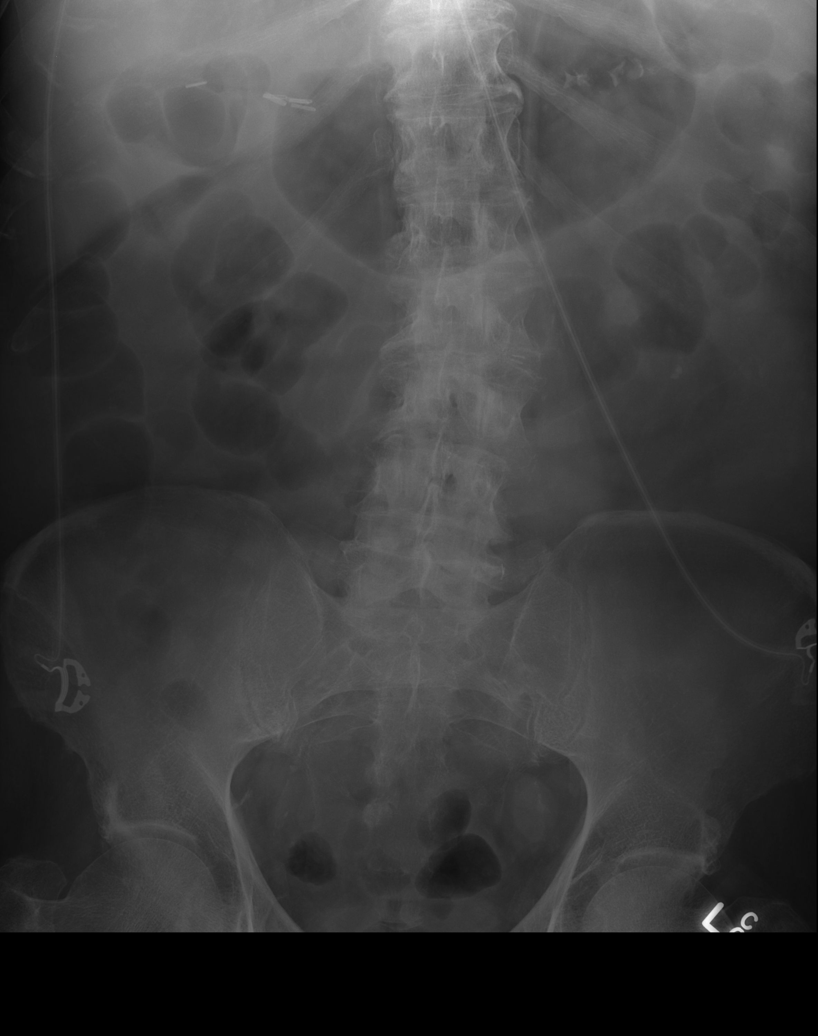

[w abdomen decub (1 of 2)]
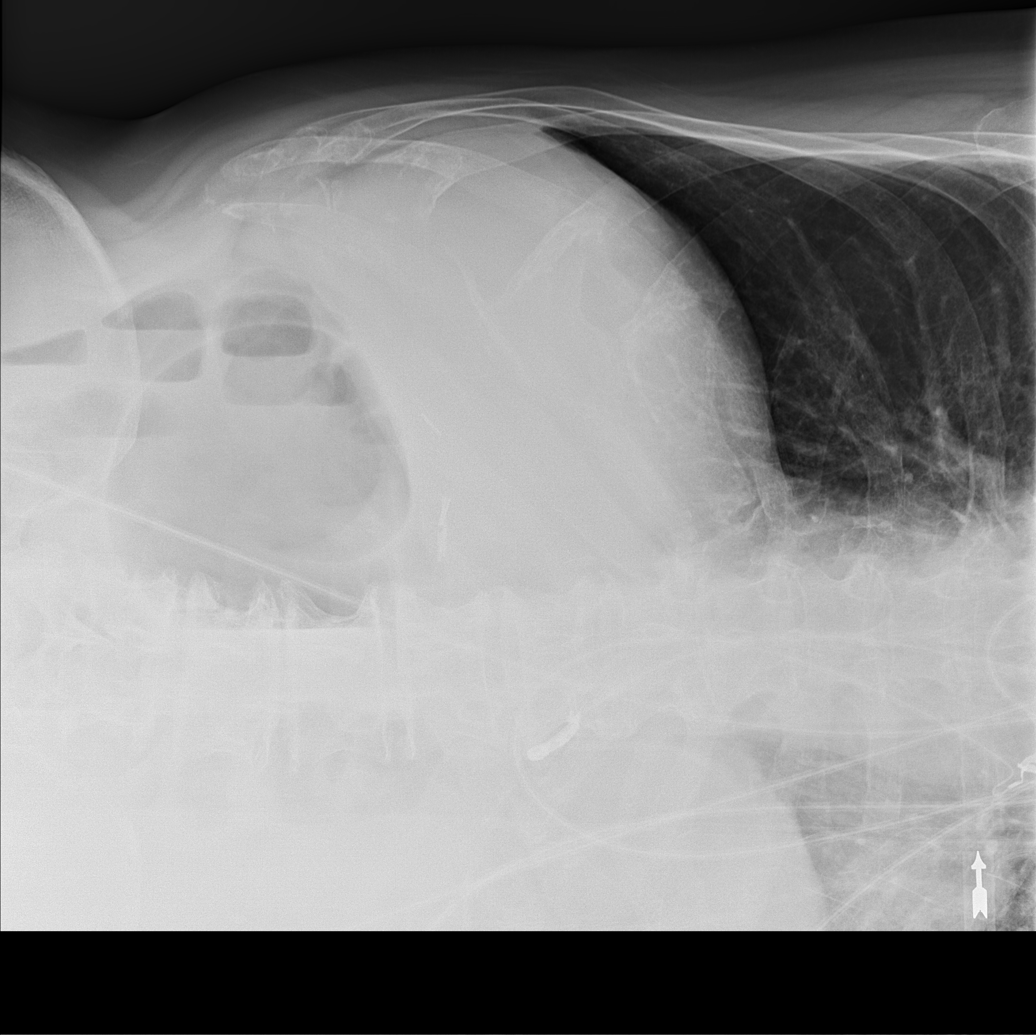

[w abdomen decub (2 of 2)]
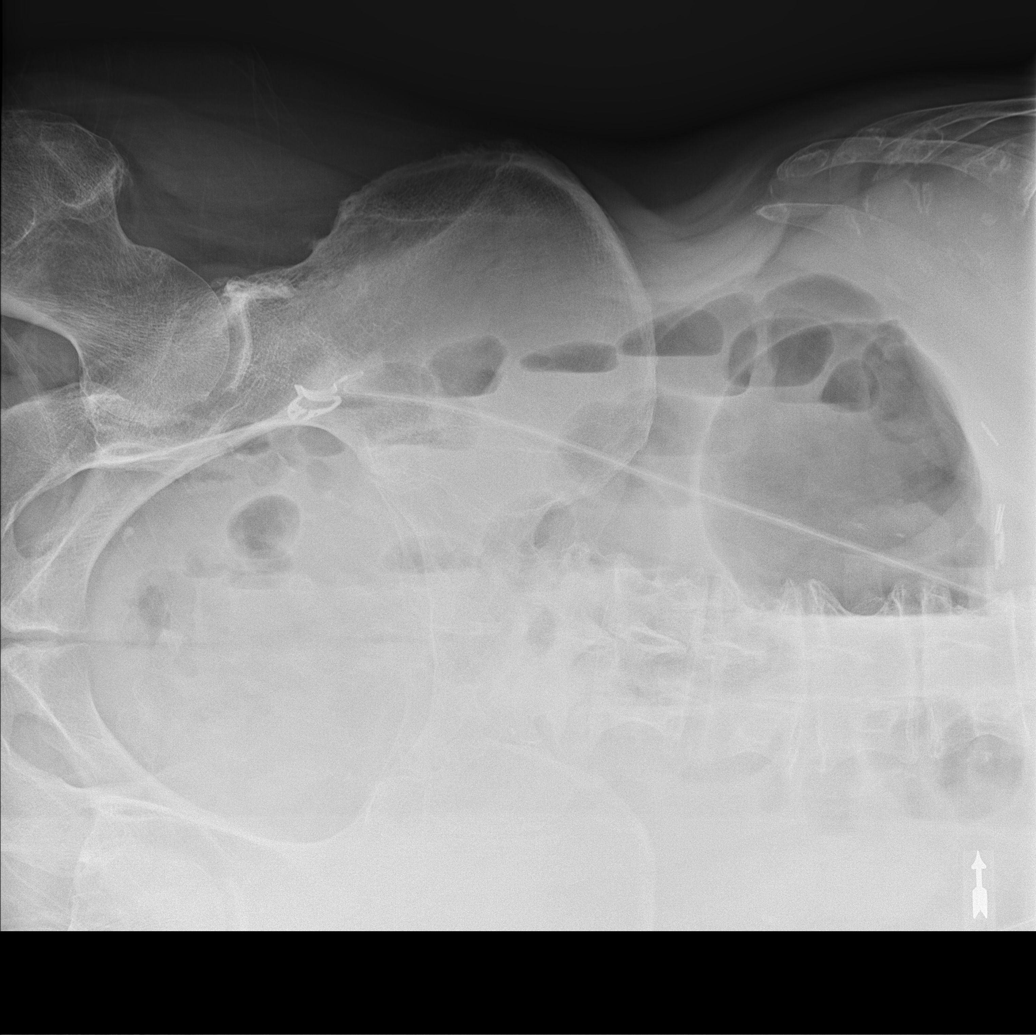

[4 of 4 positions shown; findings below may reference images not displayed]

FINDINGS: Lung volumes are low with asymmetric elevation of right
hemidiaphragm.  No edema or focal airspace consolidation.
Cardiopericardial silhouette is at upper limits of normal for size.
Telemetry leads overlie the chest.

Right-sided decubitus film shows no evidence for intraperitoneal
free air.  Supine film shows no gaseous bowel dilatation to suggest
obstruction.  No worrisome or suspicious abdominopelvic
calcification.  Degenerative changes are seen in the mid lumbar
spine.
IMPRESSION: No acute cardiopulmonary findings.

No evidence for bowel obstruction or perforation.

## 2013-08-13 ENCOUNTER — Ambulatory Visit (INDEPENDENT_AMBULATORY_CARE_PROVIDER_SITE_OTHER): Payer: Medicare Other | Admitting: Cardiology

## 2013-08-13 ENCOUNTER — Encounter: Payer: Self-pay | Admitting: Cardiology

## 2013-08-13 VITALS — BP 110/50 | HR 132 | Ht 73.5 in | Wt 207.8 lb

## 2013-08-13 DIAGNOSIS — I4891 Unspecified atrial fibrillation: Secondary | ICD-10-CM

## 2013-08-13 DIAGNOSIS — I259 Chronic ischemic heart disease, unspecified: Secondary | ICD-10-CM

## 2013-08-13 DIAGNOSIS — I119 Hypertensive heart disease without heart failure: Secondary | ICD-10-CM

## 2013-08-13 DIAGNOSIS — IMO0001 Reserved for inherently not codable concepts without codable children: Secondary | ICD-10-CM

## 2013-08-13 DIAGNOSIS — I480 Paroxysmal atrial fibrillation: Secondary | ICD-10-CM | POA: Insufficient documentation

## 2013-08-13 MED ORDER — METOPROLOL TARTRATE 50 MG PO TABS: 50.0000 mg | ORAL_TABLET | ORAL | Status: DC

## 2013-08-13 MED ORDER — METOPROLOL TARTRATE 50 MG PO TABS: 50.0000 mg | ORAL_TABLET | Freq: Two times a day (BID) | ORAL | Status: DC

## 2013-08-13 NOTE — Patient Instructions (Signed)
STOP ASPIRIN  STOP RAMIPRIL  START XARELTO 20 MG DAILY (WITH DINNER)  INCREASE METOPROLOL TO 50 MG TWICE A DAY  Your physician has requested that you have an echocardiogram. Echocardiography is a painless test that uses sound waves to create images of your heart. It provides your doctor with information about the size and shape of your heart and how well your heart's chambers and valves are working. This procedure takes approximately one hour. There are no restrictions for this procedure.  NEED FOR YOU TO GO TO THE  BUILDING ACROSS FROM [place] FOR A CHEST XRAY  Your physician recommends that you schedule a follow-up appointment in: NEXT WEEK OV/EKG WITH DR Katrinka Blazing

## 2013-08-13 NOTE — Progress Notes (Signed)
[name] Date of Birth:  1941/05/05 [address] [place], [place]  [address] [phone]         Fax   [phone]  History of Present Illness: This pleasant 72 year old gentleman is seen by me for the first time today.  He is a cardiology patient of Dr. Verdis Prime.  The patient was seen by his PCP Dr. Kirby Funk yesterday and was noted to be in atrial fibrillation with rapid ventricular response.  The patient states that he has felt poorly for about 2 or 3 weeks but especially since last Sunday 5 days ago.  He has felt tired and listless.  He has also been aware of his heart racing and skipping.  He has noted that his lip pressure machine at home has been giving him a reticulocyte readings on his pulse and blood pressure since then.  The patient states that he has had 2 or 3 prior episodes of paroxysmal atrial fibrillation over the past several years.  He had an echocardiogram 05/06/12 showing an ejection fraction of 35-40% with inferior wall akinesis.  His last chest x-ray was 09/30/12 and showed normal heart size and no active lung disease.  Patient does not have any history of stroke.  He does not have any history of GI bleeding or hemorrhage or contraindications to long-term anticoagulation.  He has had prior shortness of breath which has been a little worse over the past several days.  He has not had any severe chest discomfort.  He has a past history of hypertension and diabetes and ischemic heart disease. Current Outpatient Prescriptions  Medication Sig Dispense Refill  . atorvastatin (LIPITOR) 10 MG tablet Take 10 mg by mouth daily.      . Coenzyme Q10 (CO Q 10) 100 MG CAPS Take 100 mg by mouth daily.      . finasteride (PROPECIA) 1 MG tablet Take 1 mg by mouth once a week.       . metFORMIN (GLUCOPHAGE) 1000 MG tablet Take 1,000 mg by mouth 2 (two) times daily with a meal.      . metoprolol (LOPRESSOR) 50 MG tablet Take 1 tablet (50 mg total) by mouth 2 (two)  times daily.  60 tablet  3  . Multiple Vitamin (MULTIVITAMIN WITH MINERALS) TABS Take 1 tablet by mouth daily.      [name] omeprazole (PRILOSEC) 20 MG capsule Take 20 mg by mouth daily.      . Rivaroxaban (XARELTO) 20 MG TABS tablet Take 20 mg by mouth daily with supper.      . vitamin B-12 (CYANOCOBALAMIN) 1000 MCG tablet Take 1,000 mcg by mouth daily.       . [DISCONTINUED] promethazine (PHENERGAN) 25 MG tablet Take 25 mg by mouth every 6 (six) hours as needed.       No current facility-administered medications for this visit.    Allergies  Allergen Reactions  . Cymbalta [Duloxetine Hcl] Nausea And Vomiting and Other (See Comments)    Rapid drop in blood pressure  . Gabapentin Other (See Comments)    Makes me goofy, keeps me off balance, clouds my thinking  . Lyrica [Pregabalin] Other (See Comments)    Makes me goofy, keeps me off balance, clouds my thinking    Patient Active Problem List   Diagnosis Date Noted  . PAF (paroxysmal atrial fibrillation) 08/13/2013  . Lumbar pseudoarthrosis 10/13/2012  . Abdominal pain, generalized 05/07/2012  . Paralytic ileus 05/07/2012  . Nausea  and vomiting 01/01/2012  . Diaphoresis 01/01/2012  . DM (diabetes mellitus), type 2, uncontrolled w/neurologic complication 01/01/2012  . Leukocytosis 01/01/2012  . Hypertension   . Hypercholesteremia   . AAA (abdominal aortic aneurysm)   . Acid reflux   . Neuropathy   . Sleep apnea     History  Smoking status  . Former Smoker  . Quit date: 01/01/1992  Smokeless tobacco  . Never Used    History  Alcohol Use  . Yes    Comment: rarely    Family History  Problem Relation Age of Onset  . Diabetes Mother   . Coronary artery disease Mother   . Diabetes Father   . Arthritis Father   . Rheum arthritis Father   . Coronary artery disease Father     Review of Systems: Constitutional: no fever chills diaphoresis or fatigue or change in weight.  Head and neck: no hearing loss, no epistaxis, no  photophobia or visual disturbance. Respiratory: No cough, shortness of breath or wheezing. Cardiovascular: No chest pain peripheral edema, positive for palpitations Gastrointestinal: No abdominal distention, no abdominal pain, no change in bowel habits hematochezia or melena. Genitourinary: No dysuria, no frequency, no urgency, no nocturia. Musculoskeletal:No arthralgias, no back pain, no gait disturbance or myalgias. Neurological: No dizziness, no headaches, no numbness, no seizures, no syncope, no weakness, no tremors. Hematologic: No lymphadenopathy, no easy bruising. Psychiatric: No confusion, no hallucinations, no sleep disturbance.    Physical Exam: Filed Vitals:   08/13/13 1026  BP: 110/50  Pulse: 132   the general appearance reveals a well-developed well-nourished elderly gentleman in no acute distress.The head and neck exam reveals pupils equal and reactive.  Extraocular movements are full.  There is no scleral icterus.  The mouth and pharynx are normal.  The neck is supple.  The carotids reveal no bruits.  The jugular venous pressure is normal.  The  thyroid is not enlarged.  There is no lymphadenopathy.  The chest is clear to percussion and auscultation.  There are no rales or rhonchi.  Expansion of the chest is symmetrical.  The precordium is quiet.  The pulse is rapid and irregular .The first heart sound is normal.  The second heart sound is physiologically split.  There is no murmur gallop rub or click.  There is no abnormal lift or heave.  The abdomen is soft and nontender.  The bowel sounds are normal.  The liver and spleen are not enlarged.  There are no abdominal masses.  There are no abdominal bruits.  Extremities reveal good pedal pulses.  There is no phlebitis or edema.  There is no cyanosis or clubbing.  Strength is normal and symmetrical in all extremities.  There is no lateralizing weakness.  There are no sensory deficits.  The skin is warm and dry.  There is no rash.  EKG  today shows atrial fibrillation with rapid ventricular response.  There are no ischemic changes.  He has large inferior wall Q waves consistent with his known old inferior wall myocardial infarction   Assessment / Plan: The patient has another episode of paroxysmal atrial fibrillation.  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  He feels that he has been in atrial fibrillation this time for about 5 days if not longer.  He has a CHADSS-VASC score of 5.  We will start Xarelto 20 mg daily and we gave him samples.  His renal function is normal.  He is not  anemic.  He will stop his aspirin while on Xarelto.  Because his blood pressure is soft we will have him stop his ramipril temporarily since we are increasing his metoprolol for better rate control.  He is to increase his metoprolol to 50 mg twice a day.  We will update his echocardiogram.  We will update his chest x-ray.  We will have him return to see Dr. Katrinka Blazing next week for office visit and EKG followup.  This weekend he is to rest and avoid strenuous activity.

## 2013-08-16 ENCOUNTER — Ambulatory Visit (INDEPENDENT_AMBULATORY_CARE_PROVIDER_SITE_OTHER): Payer: Medicare Other | Admitting: Interventional Cardiology

## 2013-08-16 ENCOUNTER — Other Ambulatory Visit: Payer: Self-pay

## 2013-08-16 ENCOUNTER — Ambulatory Visit (HOSPITAL_COMMUNITY): Payer: Medicare Other | Attending: Cardiology | Admitting: Radiology

## 2013-08-16 ENCOUNTER — Encounter: Payer: Self-pay | Admitting: Interventional Cardiology

## 2013-08-16 VITALS — BP 120/78 | HR 82 | Ht 73.0 in | Wt 209.0 lb

## 2013-08-16 DIAGNOSIS — I252 Old myocardial infarction: Secondary | ICD-10-CM | POA: Insufficient documentation

## 2013-08-16 DIAGNOSIS — I2510 Atherosclerotic heart disease of native coronary artery without angina pectoris: Secondary | ICD-10-CM

## 2013-08-16 DIAGNOSIS — E119 Type 2 diabetes mellitus without complications: Secondary | ICD-10-CM | POA: Insufficient documentation

## 2013-08-16 DIAGNOSIS — G4730 Sleep apnea, unspecified: Secondary | ICD-10-CM | POA: Insufficient documentation

## 2013-08-16 DIAGNOSIS — I480 Paroxysmal atrial fibrillation: Secondary | ICD-10-CM

## 2013-08-16 DIAGNOSIS — I4891 Unspecified atrial fibrillation: Secondary | ICD-10-CM

## 2013-08-16 DIAGNOSIS — I25119 Atherosclerotic heart disease of native coronary artery with unspecified angina pectoris: Secondary | ICD-10-CM | POA: Insufficient documentation

## 2013-08-16 DIAGNOSIS — I714 Abdominal aortic aneurysm, without rupture, unspecified: Secondary | ICD-10-CM | POA: Insufficient documentation

## 2013-08-16 DIAGNOSIS — I10 Essential (primary) hypertension: Secondary | ICD-10-CM

## 2013-08-16 DIAGNOSIS — I5042 Chronic combined systolic (congestive) and diastolic (congestive) heart failure: Secondary | ICD-10-CM

## 2013-08-16 DIAGNOSIS — Z87891 Personal history of nicotine dependence: Secondary | ICD-10-CM | POA: Insufficient documentation

## 2013-08-16 DIAGNOSIS — E785 Hyperlipidemia, unspecified: Secondary | ICD-10-CM | POA: Insufficient documentation

## 2013-08-16 DIAGNOSIS — Z7901 Long term (current) use of anticoagulants: Secondary | ICD-10-CM

## 2013-08-16 MED ORDER — RAMIPRIL 10 MG PO CAPS: 10.0000 mg | ORAL_CAPSULE | Freq: Every day | ORAL | Status: DC

## 2013-08-16 NOTE — Patient Instructions (Addendum)
Your physician has recommended you make the following change in your medication:   1. Resume Ramipril 10 mg once daily 2. Continue to take the Metoprolol 50 mg twice daily 3. Continue to take Xarelto 20 mg daily  Your physician recommends that you schedule a follow-up appointment in: 3 months with Dr. Katrinka Blazing

## 2013-08-16 NOTE — Progress Notes (Signed)
Patient ID: [name], male   DOB: 1941-10-05, 72 y.o.   MRN: [id number]    [address], [address] [place], [place]  [address] Phone: [phone] Fax:  [phone]  Date:  08/16/2013   ID:  [name], DOB Apr 13, 1941, MRN [id number]  PCP:  Lillia Mountain, MD   ASSESSMENT:  1. Paroxysmal atrial fibrillation, now reverted to normal sinus rhythm  2. Chronic combined systolic and diastolic heart failure, stable  3. Chronic oral anticoagulation therapy  4. 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 Continue Xarelto and refer the patient to the anticoagulation clinic for longitudinal followup  2. Resume Ramipril 10 mg per day  3. Continue metoprolol tartrate 50 mg twice a day  4. Clinical followup in 3-4 months.  5. Resume typical activity. Caution concerning activities and increased risk of injury that could cause bleeding.   SUBJECTIVE: [name] is a 72 y.o. male who has had PAF in the past. He was recently seen by Dr. [name] because of fatigue. The fatigue is what led to a visit with the patient's primary physician who identified atrial fibrillation. When seen by Dr. [name] his heart rate was above 140 beats per minute and he had relatively low blood pressures. Ramipril 10 mg per day was held. Metoprolol tartrate was increased to 50 mg twice a day from 25 mg twice a day. Xarelto 20 mg daily was started. He is back today for followup. No bleeding. His energy is improved. He denies angina. He denies dyspnea. Overall he feels improved.   Wt Readings from Last 3 Encounters:  08/16/13 209 lb (94.802 kg)  08/13/13 207 lb 12.8 oz (94.257 kg)  05/13/13 215 lb (97.523 kg)     Past Medical History  Diagnosis Date  . Hypercholesteremia     takes lipitor daily  . AAA (abdominal aortic aneurysm)   . Pain     back pain chronic- seen at pain clinic  . Neuropathy     in legs  . Bronchitis     hx of  . Ileus   . Arthritis     BacK  . Incisional hernia    "small, from AAA"  . Coronary atherosclerosis of native coronary artery     ejection fraction of 40%, prior inferior myocardial infarction in February 1993, PTCA, LAD rotational atherectomy in August of 1998, acute inferolateral myocardial infarction, ventricular fibrillation in July of 1999, PTCA and stent of obtuse marginal 1, 100% RCA 1/12 with collaterals from patent circumflex and LAD  . Hypertension     takes Ramipril daily  . MI (myocardial infarction)     1992 and 1997 sees Dr. Mendel Ryder as needed   . Dysrhythmia     "got out of rhythym a couple , but it straightened back out."  . Sleep apnea     uses CPAP--sleep study done at least 97yrs ago  . Chronic back pain   . Dry skin   . Acid reflux     takes Prilosec and Protonix daily  . Diabetes mellitus     takes Metformin daily  . Complication of anesthesia     -years ago hair fell out.;ileus after 2 of his surgeries    Current Outpatient Prescriptions  Medication Sig Dispense Refill  . ACCU-CHEK FASTCLIX LANCETS MISC       . ACCU-CHEK SMARTVIEW test strip       . atorvastatin (LIPITOR) 10 MG tablet Take 10 mg by mouth daily.      [name]  Coenzyme Q10 (CO Q 10) 100 MG CAPS Take 100 mg by mouth daily.      . diazepam (VALIUM) 5 MG tablet       . finasteride (PROPECIA) 1 MG tablet Take 1 mg by mouth once a week.       [name] glimepiride (AMARYL) 1 MG tablet       . HYDROcodone-acetaminophen (NORCO/VICODIN) 5-325 MG per tablet       . metFORMIN (GLUCOPHAGE) 1000 MG tablet Take 1,000 mg by mouth 2 (two) times daily with a meal.      . metoprolol (LOPRESSOR) 50 MG tablet Take 1 tablet (50 mg total) by mouth 2 (two) times daily.  60 tablet  3  . Multiple Vitamin (MULTIVITAMIN WITH MINERALS) TABS Take 1 tablet by mouth daily.      [name] omeprazole (PRILOSEC) 20 MG capsule Take 20 mg by mouth daily.      . Rivaroxaban (XARELTO) 20 MG TABS tablet Take 20 mg by mouth daily with supper.      . vitamin B-12 (CYANOCOBALAMIN) 1000 MCG tablet Take 1,000  mcg by mouth daily.       . ramipril (ALTACE) 10 MG capsule Take 1 capsule (10 mg total) by mouth daily.  90 capsule  3  . [DISCONTINUED] promethazine (PHENERGAN) 25 MG tablet Take 25 mg by mouth every 6 (six) hours as needed.       No current facility-administered medications for this visit.    Allergies:    Allergies  Allergen Reactions  . Cymbalta [Duloxetine Hcl] Nausea And Vomiting and Other (See Comments)    Rapid drop in blood pressure  . Gabapentin Other (See Comments)    Makes me goofy, keeps me off balance, clouds my thinking  . Lyrica [Pregabalin] Other (See Comments)    Makes me goofy, keeps me off balance, clouds my thinking    Social History:  The patient  reports that he quit smoking about 21 years ago. He has never used smokeless tobacco. He reports that he drinks alcohol. He reports that he does not use illicit drugs.   ROS:  Please see the history of present illness. No blood in urine, stool, or phlegm. No medication side effects. No dizziness or syncope.   All other systems reviewed and negative.   OBJECTIVE: VS:  BP 120/78  Pulse 82  Ht 6\' 1"  (1.854 m)  Wt 209 lb (94.802 kg)  BMI 27.58 kg/m2 Well nourished, well developed, in no acute distress, HEENT: normal Neck: JVD flat. Carotid bruit none  Cardiac:  normal S1, S2; RRR; no murmur Lungs:  clear to auscultation bilaterally, no wheezing, rhonchi or rales Abd: soft, nontender, no hepatomegaly Ext: Edema none. Pulses 2+ and symmetric Skin: warm and dry Neuro:  CNs 2-12 intact, no focal abnormalities noted  EKG:  Normal sinus rhythm with an occasional PVC. Inferolateral Q waves compatible with prior inferior infarction. Compared to the last tracing, atrial fibrillation has resolved.       Signed, Darci Needle III, MD 08/16/2013 4:41 PM

## 2013-08-16 NOTE — Progress Notes (Signed)
Echocardiogram performed.  

## 2013-08-17 ENCOUNTER — Telehealth: Payer: Self-pay | Admitting: Interventional Cardiology

## 2013-08-17 ENCOUNTER — Telehealth: Payer: Self-pay

## 2013-08-17 MED ORDER — RIVAROXABAN 20 MG PO TABS: 20.0000 mg | ORAL_TABLET | Freq: Every day | ORAL | Status: DC

## 2013-08-17 NOTE — Telephone Encounter (Signed)
called to give pt echo results.lmom for pt to return call 

## 2013-08-17 NOTE — Telephone Encounter (Signed)
New Problem  Patient was given samples of Xarelto 20mg  he would like RX called in. He uses [facility] [address]. He would also like for Misty Stanley to give him a call back as well.

## 2013-08-17 NOTE — Telephone Encounter (Signed)
Message copied by Jarvis Newcomer on Tue Aug 17, 2013  4:35 PM ------      Message from: Verdis Prime      Created: Tue Aug 17, 2013  3:22 PM       Significant decrease in overall LV pumping ability since the last study. Ejection fraction 25-30%. Increasing the metoprolol and resuming Ramipril will help improve function. He needs office followup with me in one month rather than 3 months. Senokot 2 this report to Dr. Kirby Funk at [facility] ------

## 2013-08-17 NOTE — Telephone Encounter (Signed)
returned pt call.rx for xarelto 20mg  sent to [facility].pt verbalized understanding

## 2013-08-18 ENCOUNTER — Telehealth: Payer: Self-pay | Admitting: Pharmacist

## 2013-08-18 NOTE — Telephone Encounter (Signed)
Patient and I spoke on the phone and discussed Xarelto use, potential interaction, side effects, etc.  F/u appt made for 4 weeks from now.

## 2013-08-18 NOTE — Telephone Encounter (Signed)
Message copied by Lou Miner on Wed Aug 18, 2013  1:31 PM ------      Message from: JEFFRIES, Bartolo Darter D      Created: Mon Aug 16, 2013  4:43 PM      Regarding: Pharmacy Referral       Dr. Katrinka Blazing would like Pharmacy to contact patient for office visit. Patient is currently taking Xarelto. ------

## 2013-08-18 NOTE — Telephone Encounter (Signed)
Message copied by Jarvis Newcomer on Wed Aug 18, 2013  9:01 AM ------      Message from: Verdis Prime      Created: Tue Aug 17, 2013  3:26 PM       See below. Send result to Dr. Kirby Funk. ------

## 2013-08-18 NOTE — Telephone Encounter (Signed)
pt called back and made aware of echo results. pt f/u sch 09/21/13 @9 :15 pt verbalized understanding.

## 2013-08-20 ENCOUNTER — Telehealth: Payer: Self-pay | Admitting: Interventional Cardiology

## 2013-08-20 NOTE — Telephone Encounter (Signed)
returned pt call. pt sts that xarelto was denied by his ins and that they would like add info.adv pt that they probobly want a prior auth. and they will forward that rqstto our office.pt has been given enough samples to carry him over to his next appt 09/21/13.pt verbalized understanding.

## 2013-08-20 NOTE — Telephone Encounter (Signed)
New message    Ins will not approve pres you called---Need to talk to nurse

## 2013-09-02 ENCOUNTER — Other Ambulatory Visit: Payer: Self-pay

## 2013-09-07 ENCOUNTER — Telehealth: Payer: Self-pay | Admitting: *Deleted

## 2013-09-07 NOTE — Telephone Encounter (Signed)
Patient called in, needs PA for xarelto, called [facility] with approval until 09/07/2014 with PA #[id number] Member ID # [id number]

## 2013-09-07 NOTE — Addendum Note (Signed)
Addended by: Carmela Hurt on: 09/07/2013 05:22 PM   Modules accepted: Level of Service, SmartSet

## 2013-09-07 NOTE — Telephone Encounter (Signed)
This encounter was created in error - please disregard.

## 2013-09-08 ENCOUNTER — Ambulatory Visit (INDEPENDENT_AMBULATORY_CARE_PROVIDER_SITE_OTHER): Payer: Medicare Other | Admitting: Interventional Cardiology

## 2013-09-08 ENCOUNTER — Encounter: Payer: Self-pay | Admitting: Interventional Cardiology

## 2013-09-08 VITALS — BP 132/62 | HR 79 | Ht 73.5 in | Wt 205.0 lb

## 2013-09-08 DIAGNOSIS — I2510 Atherosclerotic heart disease of native coronary artery without angina pectoris: Secondary | ICD-10-CM

## 2013-09-08 DIAGNOSIS — Z7901 Long term (current) use of anticoagulants: Secondary | ICD-10-CM

## 2013-09-08 DIAGNOSIS — I480 Paroxysmal atrial fibrillation: Secondary | ICD-10-CM

## 2013-09-08 DIAGNOSIS — I5042 Chronic combined systolic (congestive) and diastolic (congestive) heart failure: Secondary | ICD-10-CM

## 2013-09-08 DIAGNOSIS — I4891 Unspecified atrial fibrillation: Secondary | ICD-10-CM

## 2013-09-08 NOTE — Progress Notes (Signed)
Patient ID: [name], male   DOB: 1941-06-29, 72 y.o.   MRN: [id number]    [address], [address] [place], [place]  [address] Phone: [phone] Fax:  [phone]  Date:  09/08/2013   ID:  [name], DOB [birth year], MRN [id number]  PCP:  Lillia Mountain, MD   ASSESSMENT:  1. Paroxysmal atrial fibrillation 2. Hypertension 3. Chronic oral anticoagulation therapy, Xarelto 4. Left ventricular systolic dysfunction, chronic combined systolic and diastolic heart failure, stable  Protracted conversation with the patient concerning the indications, benefits, and potential risks of chronic anticoagulation therapy. Also discussed the difference between Coumadin and the NOAC's. This conversation greatly extended office visit to greater than 40 minutes.  PLAN:  1. Continue anticoagulation therapy 2. Will have hemoglobin and creatinine performed within the next several weeks 3. Look for evidence of bleeding   SUBJECTIVE: [name] is a 72 y.o. male who denies angina, orthopnea, PND, palpitations, and syncope. No peripheral edema. No blood loss complications on anticoagulation therapy. In today to discuss the absolute requirement for anticoagulation. He doesn't want to be on Coumadin but has issues with the expense NOAC. He has not had falls. He denies head trauma. No chills or fever. No medication side effects.   Wt Readings from Last 3 Encounters:  09/08/13 205 lb (92.987 kg)  08/16/13 209 lb (94.802 kg)  08/13/13 207 lb 12.8 oz (94.257 kg)     Past Medical History  Diagnosis Date  . Hypercholesteremia     takes lipitor daily  . AAA (abdominal aortic aneurysm)   . Pain     back pain chronic- seen at pain clinic  . Neuropathy     in legs  . Bronchitis     hx of  . Ileus   . Arthritis     BacK  . Incisional hernia     "small, from AAA"  . Coronary atherosclerosis of native coronary artery     ejection fraction of 40%, prior inferior myocardial infarction in  February 1993, PTCA, LAD rotational atherectomy in August of 1998, acute inferolateral myocardial infarction, ventricular fibrillation in July of 1999, PTCA and stent of obtuse marginal 1, 100% RCA 1/12 with collaterals from patent circumflex and LAD  . Hypertension     takes Ramipril daily  . MI (myocardial infarction)     1992 and 1997 sees Dr. Mendel Ryder as needed   . Dysrhythmia     "got out of rhythym a couple , but it straightened back out."  . Sleep apnea     uses CPAP--sleep study done at least 56yrs ago  . Chronic back pain   . Dry skin   . Acid reflux     takes Prilosec and Protonix daily  . Diabetes mellitus     takes Metformin daily  . Complication of anesthesia     -years ago hair fell out.;ileus after 2 of his surgeries    Current Outpatient Prescriptions  Medication Sig Dispense Refill  . ACCU-CHEK FASTCLIX LANCETS MISC       . ACCU-CHEK SMARTVIEW test strip       . atorvastatin (LIPITOR) 10 MG tablet Take 10 mg by mouth daily.      . Coenzyme Q10 (CO Q 10) 100 MG CAPS Take 100 mg by mouth daily.      . diazepam (VALIUM) 5 MG tablet Take 5 mg by mouth every 6 (six) hours as needed.       . finasteride (  PROPECIA) 1 MG tablet Take 1 mg by mouth daily.       [name] glimepiride (AMARYL) 1 MG tablet       . HYDROcodone-acetaminophen (NORCO/VICODIN) 5-325 MG per tablet 1 tablet every 6 (six) hours as needed.       . metFORMIN (GLUCOPHAGE) 1000 MG tablet Take 1,000 mg by mouth 2 (two) times daily with a meal.      . metoprolol (LOPRESSOR) 50 MG tablet Take 1 tablet (50 mg total) by mouth 2 (two) times daily.  60 tablet  3  . Multiple Vitamin (MULTIVITAMIN WITH MINERALS) TABS Take 1 tablet by mouth daily.      [name] omeprazole (PRILOSEC) 20 MG capsule Take 20 mg by mouth daily.      . Rivaroxaban (XARELTO) 20 MG TABS tablet Take 1 tablet (20 mg total) by mouth daily with supper.  30 tablet  11  . vitamin B-12 (CYANOCOBALAMIN) 1000 MCG tablet Take 1,000 mcg by mouth daily.       .  [DISCONTINUED] promethazine (PHENERGAN) 25 MG tablet Take 25 mg by mouth every 6 (six) hours as needed.       No current facility-administered medications for this visit.    Allergies:    Allergies  Allergen Reactions  . Cymbalta [Duloxetine Hcl] Nausea And Vomiting and Other (See Comments)    Rapid drop in blood pressure  . Gabapentin Other (See Comments)    Makes me goofy, keeps me off balance, clouds my thinking  . Lyrica [Pregabalin] Other (See Comments)    Makes me goofy, keeps me off balance, clouds my thinking    Social History:  The patient  reports that he quit smoking about 21 years ago. He has never used smokeless tobacco. He reports that he drinks alcohol. He reports that he does not use illicit drugs.   ROS:  Please see the history of present illness.   Melena, hematemesis, hematochezia, syncope, orthostasis, edema, chills, fever, and sustained palpitations.   All other systems reviewed and negative.   OBJECTIVE: VS:  BP 132/62  Pulse 79  Ht 6' 1.5" (1.867 m)  Wt 205 lb (92.987 kg)  BMI 26.68 kg/m2  SpO2 98% Well nourished, well developed, in no acute distress, good skin color, truncal obesity HEENT: normal Neck: JVD flat. Carotid bruit absent  Cardiac:  normal S1, S2; RRR; no murmur Lungs:  clear to auscultation bilaterally, no wheezing, rhonchi or rales Abd: soft, nontender, no hepatomegaly Ext: Edema absent. Pulses bilateral, 2+ Skin: warm and dry Neuro:  CNs 2-12 intact, no focal abnormalities noted  EKG:  Not repeated       Signed, Darci Needle III, MD 09/08/2013 1:03 PM

## 2013-09-08 NOTE — Patient Instructions (Addendum)
Your physician recommends that you continue on your current medications as directed. Please refer to the Current Medication list given to you today.  Your physician recommends that you schedule a follow-up appointment in: keep your upcoming appt 09/2014

## 2013-09-21 ENCOUNTER — Ambulatory Visit: Payer: Medicare Other | Admitting: Interventional Cardiology

## 2013-09-28 ENCOUNTER — Ambulatory Visit (INDEPENDENT_AMBULATORY_CARE_PROVIDER_SITE_OTHER): Payer: Medicare Other | Admitting: *Deleted

## 2013-09-28 ENCOUNTER — Telehealth: Payer: Self-pay | Admitting: Interventional Cardiology

## 2013-09-28 DIAGNOSIS — I4891 Unspecified atrial fibrillation: Secondary | ICD-10-CM

## 2013-09-28 LAB — CBC
HCT: 40.9 % (ref 39.0–52.0)
Hemoglobin: 14.1 g/dL (ref 13.0–17.0)
MCHC: 34.5 g/dL (ref 30.0–36.0)
MCV: 91.7 fl (ref 78.0–100.0)
Platelets: 180 10*3/uL (ref 150.0–400.0)
RBC: 4.46 Mil/uL (ref 4.22–5.81)
RDW: 13 % (ref 11.5–14.6)
WBC: 9.2 10*3/uL (ref 4.5–10.5)

## 2013-09-28 LAB — BASIC METABOLIC PANEL
BUN: 10 mg/dL (ref 6–23)
CO2: 28 mEq/L (ref 19–32)
Calcium: 10.2 mg/dL (ref 8.4–10.5)
Chloride: 101 mEq/L (ref 96–112)
Creatinine, Ser: 0.8 mg/dL (ref 0.4–1.5)
GFR: 104.8 mL/min (ref >60.00)
Glucose, Bld: 199 mg/dL — ABNORMAL HIGH (ref 70–99)
Potassium: 4.4 mEq/L (ref 3.5–5.1)
Sodium: 136 mEq/L (ref 135–145)

## 2013-09-28 NOTE — Progress Notes (Signed)
Pt was started on Xarelto 20 mg daily for paroxysmal Atrial Fib on 08/13/2013.    Reviewed patients medication list.  Pt is not currently on any combined P-gp and strong CYP3A4 inhibitors/inducers (ketoconazole, traconazole, ritonavir, carbamazepine, phenytoin, rifampin, St. John's wort).  Reviewed labs.  SCr 0.8, Weight 91.27kg, CrCl- 104.58.  Dose is appropriate based on CrCl.   Hgb 14.1 and HCT 40.9  A full discussion of the nature of anticoagulants has been carried out.  A benefit/risk analysis has been presented to the patient, so that they understand the justification for choosing anticoagulation with Xarelto at this time.  The need for compliance is stressed.  Pt is aware to take the medication once daily with the largest meal of the day.  Side effects of potential bleeding are discussed, including unusual colored urine or stools, coughing up blood or coffee ground emesis, nose bleeds or serious fall or head trauma.  Discussed signs and symptoms of stroke. The patient should avoid any OTC items containing aspirin or ibuprofen.  Avoid alcohol consumption.   Call if any signs of abnormal bleeding.  Discussed financial obligations and states is able to obtain at present.  Next lab test test in 6 months.  Dr Riki Rusk Smart talked with  pt at length regarding his complaint of nausea and that has problem with constipation and then will have sudden diarrhea Given 3 options per Dr Jimmye Norman to hold CO Q ten or to talk with Endocrinologist regarding dose of Metformin or change from Xarelto to Eliquis and pt chose to stop CO Q  10 at present and to continue Xarelto at present Will have CBC and BMET done today   09/29/2013 Called pt and informed of above lab results and that is on correct dose of Xarelto and appt made for return visit in June 2015  Pt also instructed to call if he has any problems of questions and he states understanding.

## 2013-11-06 ENCOUNTER — Encounter (HOSPITAL_COMMUNITY): Payer: Self-pay | Admitting: Emergency Medicine

## 2013-11-06 DIAGNOSIS — K432 Incisional hernia without obstruction or gangrene: Secondary | ICD-10-CM | POA: Insufficient documentation

## 2013-11-06 DIAGNOSIS — E7800 Pure hypercholesterolemia, unspecified: Secondary | ICD-10-CM | POA: Insufficient documentation

## 2013-11-06 DIAGNOSIS — J3489 Other specified disorders of nose and nasal sinuses: Secondary | ICD-10-CM | POA: Insufficient documentation

## 2013-11-06 DIAGNOSIS — B9789 Other viral agents as the cause of diseases classified elsewhere: Secondary | ICD-10-CM | POA: Insufficient documentation

## 2013-11-06 DIAGNOSIS — R0789 Other chest pain: Principal | ICD-10-CM | POA: Insufficient documentation

## 2013-11-06 DIAGNOSIS — I714 Abdominal aortic aneurysm, without rupture, unspecified: Secondary | ICD-10-CM | POA: Insufficient documentation

## 2013-11-06 DIAGNOSIS — G589 Mononeuropathy, unspecified: Secondary | ICD-10-CM | POA: Insufficient documentation

## 2013-11-06 DIAGNOSIS — M129 Arthropathy, unspecified: Secondary | ICD-10-CM | POA: Insufficient documentation

## 2013-11-06 DIAGNOSIS — Z87891 Personal history of nicotine dependence: Secondary | ICD-10-CM | POA: Insufficient documentation

## 2013-11-06 DIAGNOSIS — G4730 Sleep apnea, unspecified: Secondary | ICD-10-CM | POA: Insufficient documentation

## 2013-11-06 DIAGNOSIS — Z9861 Coronary angioplasty status: Secondary | ICD-10-CM | POA: Insufficient documentation

## 2013-11-06 DIAGNOSIS — I10 Essential (primary) hypertension: Secondary | ICD-10-CM | POA: Insufficient documentation

## 2013-11-06 DIAGNOSIS — K560 Paralytic ileus: Secondary | ICD-10-CM | POA: Insufficient documentation

## 2013-11-06 DIAGNOSIS — K219 Gastro-esophageal reflux disease without esophagitis: Secondary | ICD-10-CM | POA: Insufficient documentation

## 2013-11-06 DIAGNOSIS — Z7982 Long term (current) use of aspirin: Secondary | ICD-10-CM | POA: Insufficient documentation

## 2013-11-06 DIAGNOSIS — I2510 Atherosclerotic heart disease of native coronary artery without angina pectoris: Secondary | ICD-10-CM | POA: Insufficient documentation

## 2013-11-06 DIAGNOSIS — Z872 Personal history of diseases of the skin and subcutaneous tissue: Secondary | ICD-10-CM | POA: Insufficient documentation

## 2013-11-06 DIAGNOSIS — I252 Old myocardial infarction: Secondary | ICD-10-CM | POA: Insufficient documentation

## 2013-11-06 DIAGNOSIS — E1149 Type 2 diabetes mellitus with other diabetic neurological complication: Secondary | ICD-10-CM | POA: Insufficient documentation

## 2013-11-06 DIAGNOSIS — G8929 Other chronic pain: Secondary | ICD-10-CM | POA: Insufficient documentation

## 2013-11-06 DIAGNOSIS — R059 Cough, unspecified: Secondary | ICD-10-CM | POA: Insufficient documentation

## 2013-11-06 DIAGNOSIS — Z79899 Other long term (current) drug therapy: Secondary | ICD-10-CM | POA: Insufficient documentation

## 2013-11-06 DIAGNOSIS — R05 Cough: Secondary | ICD-10-CM | POA: Insufficient documentation

## 2013-11-06 DIAGNOSIS — Z888 Allergy status to other drugs, medicaments and biological substances status: Secondary | ICD-10-CM | POA: Insufficient documentation

## 2013-11-06 DIAGNOSIS — I499 Cardiac arrhythmia, unspecified: Secondary | ICD-10-CM | POA: Insufficient documentation

## 2013-11-06 NOTE — ED Notes (Signed)
Pt. reports chest pressure/heaviness onset this evening with slight SOB , nasal congestion / occasional dry cough and fatigue . Pt. states history of CAD with coronary stents - his cardiologist is Dr. Daneen Schick.

## 2013-11-07 ENCOUNTER — Emergency Department (HOSPITAL_COMMUNITY): Payer: Medicare Other

## 2013-11-07 ENCOUNTER — Encounter (HOSPITAL_COMMUNITY): Payer: Self-pay | Admitting: Internal Medicine

## 2013-11-07 ENCOUNTER — Observation Stay (HOSPITAL_COMMUNITY)
Admission: EM | Admit: 2013-11-07 | Discharge: 2013-11-08 | Disposition: A | Discharge status: Home / Self Care | Payer: Medicare Other | Attending: Internal Medicine | Admitting: Internal Medicine

## 2013-11-07 DIAGNOSIS — E1149 Type 2 diabetes mellitus with other diabetic neurological complication: Secondary | ICD-10-CM | POA: Diagnosis present

## 2013-11-07 DIAGNOSIS — IMO0002 Reserved for concepts with insufficient information to code with codable children: Secondary | ICD-10-CM

## 2013-11-07 DIAGNOSIS — B349 Viral infection, unspecified: Secondary | ICD-10-CM | POA: Diagnosis present

## 2013-11-07 DIAGNOSIS — E7800 Pure hypercholesterolemia, unspecified: Secondary | ICD-10-CM | POA: Diagnosis present

## 2013-11-07 DIAGNOSIS — Z7901 Long term (current) use of anticoagulants: Secondary | ICD-10-CM

## 2013-11-07 DIAGNOSIS — I10 Essential (primary) hypertension: Secondary | ICD-10-CM | POA: Diagnosis present

## 2013-11-07 DIAGNOSIS — B9789 Other viral agents as the cause of diseases classified elsewhere: Secondary | ICD-10-CM

## 2013-11-07 DIAGNOSIS — E1140 Type 2 diabetes mellitus with diabetic neuropathy, unspecified: Secondary | ICD-10-CM | POA: Diagnosis present

## 2013-11-07 DIAGNOSIS — J09X2 Influenza due to identified novel influenza A virus with other respiratory manifestations: Secondary | ICD-10-CM | POA: Diagnosis present

## 2013-11-07 DIAGNOSIS — I480 Paroxysmal atrial fibrillation: Secondary | ICD-10-CM | POA: Diagnosis present

## 2013-11-07 DIAGNOSIS — E1165 Type 2 diabetes mellitus with hyperglycemia: Secondary | ICD-10-CM

## 2013-11-07 DIAGNOSIS — R079 Chest pain, unspecified: Secondary | ICD-10-CM | POA: Diagnosis present

## 2013-11-07 DIAGNOSIS — E1142 Type 2 diabetes mellitus with diabetic polyneuropathy: Secondary | ICD-10-CM

## 2013-11-07 DIAGNOSIS — G4730 Sleep apnea, unspecified: Secondary | ICD-10-CM | POA: Diagnosis present

## 2013-11-07 LAB — CBC
HCT: 39.9 % (ref 39.0–52.0)
Hemoglobin: 14.4 g/dL (ref 13.0–17.0)
MCH: 32.4 pg (ref 26.0–34.0)
MCHC: 36.1 g/dL — ABNORMAL HIGH (ref 30.0–36.0)
MCV: 89.9 fL (ref 78.0–100.0)
Platelets: 152 10*3/uL (ref 150–400)
RBC: 4.44 MIL/uL (ref 4.22–5.81)
RDW: 12.9 % (ref 11.5–15.5)
WBC: 6 10*3/uL (ref 4.0–10.5)

## 2013-11-07 LAB — GLUCOSE, CAPILLARY
Glucose-Capillary: 232 mg/dL — ABNORMAL HIGH (ref 70–99)
Glucose-Capillary: 198 mg/dL — ABNORMAL HIGH (ref 70–99)
Glucose-Capillary: 144 mg/dL — ABNORMAL HIGH (ref 70–99)
Glucose-Capillary: 203 mg/dL — ABNORMAL HIGH (ref 70–99)

## 2013-11-07 LAB — POCT I-STAT TROPONIN I: Troponin i, poc: 0 ng/mL (ref 0.00–0.08)

## 2013-11-07 LAB — BASIC METABOLIC PANEL
BUN: 13 mg/dL (ref 6–23)
CO2: 27 mEq/L (ref 19–32)
Calcium: 10 mg/dL (ref 8.4–10.5)
Chloride: 96 mEq/L (ref 96–112)
Creatinine, Ser: 0.64 mg/dL (ref 0.50–1.35)
GFR calc Af Amer: >90 mL/min (ref >90)
GFR calc non Af Amer: >90 mL/min (ref >90)
Glucose, Bld: 200 mg/dL — ABNORMAL HIGH (ref 70–99)
Potassium: 4 mEq/L (ref 3.7–5.3)
Sodium: 136 mEq/L — ABNORMAL LOW (ref 137–147)

## 2013-11-07 LAB — TROPONIN I
Troponin I: <0.3 ng/mL (ref <0.30)
Troponin I: <0.3 ng/mL (ref <0.30)

## 2013-11-07 LAB — INFLUENZA PANEL BY PCR (TYPE A & B)
H1N1 flu by pcr: NOT DETECTED
Influenza A By PCR: POSITIVE — AB
Influenza B By PCR: NEGATIVE

## 2013-11-07 LAB — PRO B NATRIURETIC PEPTIDE: Pro B Natriuretic peptide (BNP): 156.3 pg/mL — ABNORMAL HIGH (ref 0–125)

## 2013-11-07 MED ORDER — HYDROCODONE-HOMATROPINE 5-1.5 MG/5ML PO SYRP
5.0000 mL | ORAL_SOLUTION | ORAL | Status: DC | PRN
  Administered 2013-11-07 (×3): 5 mL via ORAL
  Filled 2013-11-07 (×3): qty 5

## 2013-11-07 MED ORDER — INSULIN ASPART 100 UNIT/ML ~~LOC~~ SOLN: 0.0000 [IU] | Freq: Three times a day (TID) | SUBCUTANEOUS | Status: DC

## 2013-11-07 MED ORDER — DIAZEPAM 5 MG PO TABS: 5.0000 mg | ORAL_TABLET | Freq: Four times a day (QID) | ORAL | Status: DC | PRN

## 2013-11-07 MED ORDER — OSELTAMIVIR PHOSPHATE 75 MG PO CAPS
75.0000 mg | ORAL_CAPSULE | Freq: Two times a day (BID) | ORAL | Status: DC
  Administered 2013-11-07 (×2): 75 mg via ORAL
  Filled 2013-11-07 (×4): qty 1

## 2013-11-07 MED ORDER — INSULIN ASPART 100 UNIT/ML ~~LOC~~ SOLN: 0.0000 [IU] | Freq: Every day | SUBCUTANEOUS | Status: DC

## 2013-11-07 MED ORDER — METFORMIN HCL 500 MG PO TABS
1000.0000 mg | ORAL_TABLET | Freq: Two times a day (BID) | ORAL | Status: DC
  Administered 2013-11-07: 1000 mg via ORAL
  Filled 2013-11-07 (×5): qty 2

## 2013-11-07 MED ORDER — METOPROLOL TARTRATE 50 MG PO TABS
50.0000 mg | ORAL_TABLET | Freq: Two times a day (BID) | ORAL | Status: DC
  Administered 2013-11-07 (×2): 50 mg via ORAL
  Filled 2013-11-07 (×4): qty 1

## 2013-11-07 MED ORDER — NITROGLYCERIN 0.4 MG SL SUBL: 0.4000 mg | SUBLINGUAL_TABLET | SUBLINGUAL | Status: DC | PRN

## 2013-11-07 MED ORDER — ASPIRIN EC 325 MG PO TBEC
325.0000 mg | DELAYED_RELEASE_TABLET | Freq: Every day | ORAL | Status: DC
  Administered 2013-11-07: 325 mg via ORAL
  Filled 2013-11-07: qty 1

## 2013-11-07 MED ORDER — DEXTROSE-NACL 5-0.9 % IV SOLN
INTRAVENOUS | Status: DC
  Administered 2013-11-07: 04:00:00 via INTRAVENOUS

## 2013-11-07 MED ORDER — ASPIRIN 81 MG PO CHEW
324.0000 mg | CHEWABLE_TABLET | Freq: Once | ORAL | Status: AC
  Administered 2013-11-07: 324 mg via ORAL
  Filled 2013-11-07: qty 4

## 2013-11-07 MED ORDER — CO Q 10 100 MG PO CAPS: 100.0000 mg | ORAL_CAPSULE | Freq: Every day | ORAL | Status: DC

## 2013-11-07 MED ORDER — FINASTERIDE 1 MG PO TABS: 1.0000 mg | ORAL_TABLET | Freq: Every day | ORAL | Status: DC

## 2013-11-07 MED ORDER — ONDANSETRON HCL 4 MG/2ML IJ SOLN
4.0000 mg | Freq: Four times a day (QID) | INTRAMUSCULAR | Status: DC | PRN
Start: 2013-11-07 — End: 2013-11-08

## 2013-11-07 MED ORDER — ONDANSETRON HCL 4 MG PO TABS
4.0000 mg | ORAL_TABLET | Freq: Four times a day (QID) | ORAL | Status: DC | PRN
Start: 2013-11-07 — End: 2013-11-08

## 2013-11-07 MED ORDER — RIVAROXABAN 20 MG PO TABS
20.0000 mg | ORAL_TABLET | Freq: Every day | ORAL | Status: DC
  Administered 2013-11-07: 20 mg via ORAL
  Filled 2013-11-07 (×2): qty 1

## 2013-11-07 MED ORDER — PANTOPRAZOLE SODIUM 40 MG PO TBEC
40.0000 mg | DELAYED_RELEASE_TABLET | Freq: Every day | ORAL | Status: DC
Start: 2013-11-07 — End: 2013-11-08
  Administered 2013-11-07: 40 mg via ORAL
  Filled 2013-11-07: qty 1

## 2013-11-07 MED ORDER — DOCUSATE SODIUM 100 MG PO CAPS
100.0000 mg | ORAL_CAPSULE | Freq: Two times a day (BID) | ORAL | Status: DC
  Administered 2013-11-07: 100 mg via ORAL
  Filled 2013-11-07 (×4): qty 1

## 2013-11-07 MED ORDER — GLIMEPIRIDE 1 MG PO TABS
1.0000 mg | ORAL_TABLET | Freq: Every day | ORAL | Status: DC
  Administered 2013-11-07: 1 mg via ORAL
  Filled 2013-11-07 (×3): qty 1

## 2013-11-07 MED ORDER — RAMIPRIL 10 MG PO CAPS
10.0000 mg | ORAL_CAPSULE | Freq: Every day | ORAL | Status: DC
  Administered 2013-11-07: 10 mg via ORAL
  Filled 2013-11-07 (×2): qty 1

## 2013-11-07 MED ORDER — VITAMIN B-12 1000 MCG PO TABS
1000.0000 ug | ORAL_TABLET | Freq: Every day | ORAL | Status: DC
  Administered 2013-11-07: 1000 ug via ORAL
  Filled 2013-11-07 (×2): qty 1

## 2013-11-07 MED ORDER — ATORVASTATIN CALCIUM 10 MG PO TABS
10.0000 mg | ORAL_TABLET | Freq: Every day | ORAL | Status: DC
  Administered 2013-11-07: 10 mg via ORAL
  Filled 2013-11-07 (×2): qty 1

## 2013-11-07 MED ORDER — SODIUM CHLORIDE 0.9 % IJ SOLN
3.0000 mL | Freq: Two times a day (BID) | INTRAMUSCULAR | Status: DC
  Administered 2013-11-07: 3 mL via INTRAVENOUS

## 2013-11-07 NOTE — ED Provider Notes (Signed)
CSN: [id number]     Arrival date & time 11/06/13  2330 History   First MD Initiated Contact with Patient 11/07/13 0007     Chief Complaint  Patient presents with  . Chest Pain   (Consider location/radiation/quality/duration/timing/severity/associated sxs/prior Treatment) HPI Hx per PT - woke from a nap today around 10pm with substernal pain felt like " I was punched in the chest" has had ongoing cough and "cold" for the last month. No F/C. Pain lasted about 40 minutes. Has nasal congestion. Symptoms MOD in severity. Recently started xarelto for h/o afib. No palpitations. No leg pain/ swelling. With his pain, no aggravating or alleviating factors.   Past Medical History  Diagnosis Date  . Hypercholesteremia     takes lipitor daily  . AAA (abdominal aortic aneurysm)   . Pain     back pain chronic- seen at pain clinic  . Neuropathy     in legs  . Bronchitis     hx of  . Ileus   . Arthritis     BacK  . Incisional hernia     "small, from AAA"  . Coronary atherosclerosis of native coronary artery     ejection fraction of 40%, prior inferior myocardial infarction in February 1993, PTCA, LAD rotational atherectomy in August of 1998, acute inferolateral myocardial infarction, ventricular fibrillation in July of 1999, PTCA and stent of obtuse marginal 1, 100% RCA 1/12 with collaterals from patent circumflex and LAD  . Hypertension     takes Ramipril daily  . MI (myocardial infarction)     1992 and 1997 sees Dr. Linard Millers as needed   . Dysrhythmia     "got out of rhythym a couple , but it straightened back out."  . Sleep apnea     uses CPAP--sleep study done at least 52yrs ago  . Chronic back pain   . Dry skin   . Acid reflux     takes Prilosec and Protonix daily  . Diabetes mellitus     takes Metformin daily  . Complication of anesthesia     -years ago hair fell out.;ileus after 2 of his surgeries   Past Surgical History  Procedure Laterality Date  . Abdominal surgery    .  Abdominal aortic aneurysm repair    . Colonoscopy    . Cholecystectomy    . Back surgery  2013    Miminal Invasive x2 in [place].  . Cardiac catheterization  2009/2012  . Coronary angioplasty      2 stents  . Coronary stent placement     Family History  Problem Relation Age of Onset  . Diabetes Mother   . Coronary artery disease Mother   . Diabetes Father   . Arthritis Father   . Rheum arthritis Father   . Coronary artery disease Father    History  Substance Use Topics  . Smoking status: Former Smoker    Quit date: 01/01/1992  . Smokeless tobacco: Never Used  . Alcohol Use: Yes     Comment: rarely    Review of Systems  Constitutional: Negative for fever and chills.  HENT: Positive for congestion.   Respiratory: Positive for cough.   Cardiovascular: Positive for chest pain.  Gastrointestinal: Negative for abdominal pain.  Genitourinary: Negative for flank pain.  Musculoskeletal: Negative for back pain, neck pain and neck stiffness.  Skin: Negative for rash.  Neurological: Negative for headaches.  All other systems reviewed and are negative.    Allergies  Cymbalta; Gabapentin;  and Lyrica  Home Medications   Current Outpatient Rx  Name  Route  Sig  Dispense  Refill  . ACCU-CHEK FASTCLIX LANCETS MISC               . ACCU-CHEK SMARTVIEW test strip               . atorvastatin (LIPITOR) 10 MG tablet   Oral   Take 10 mg by mouth daily.         . Coenzyme Q10 (CO Q 10) 100 MG CAPS   Oral   Take 100 mg by mouth daily.         . diazepam (VALIUM) 5 MG tablet   Oral   Take 5 mg by mouth every 6 (six) hours as needed.          . finasteride (PROPECIA) 1 MG tablet   Oral   Take 1 mg by mouth daily.          [name] glimepiride (AMARYL) 1 MG tablet               . HYDROcodone-acetaminophen (NORCO/VICODIN) 5-325 MG per tablet      1 tablet every 6 (six) hours as needed.          . metFORMIN (GLUCOPHAGE) 1000 MG tablet   Oral   Take 1,000  mg by mouth 2 (two) times daily with a meal.         . metoprolol (LOPRESSOR) 50 MG tablet   Oral   Take 1 tablet (50 mg total) by mouth 2 (two) times daily.   60 tablet   3     New dose to put on file.   . Multiple Vitamin (MULTIVITAMIN WITH MINERALS) TABS   Oral   Take 1 tablet by mouth daily.         [name] omeprazole (PRILOSEC) 20 MG capsule   Oral   Take 20 mg by mouth daily.         . Rivaroxaban (XARELTO) 20 MG TABS tablet   Oral   Take 1 tablet (20 mg total) by mouth daily with supper.   30 tablet   11   . vitamin B-12 (CYANOCOBALAMIN) 1000 MCG tablet   Oral   Take 1,000 mcg by mouth daily.           BP 121/72  Temp(Src) 97.9 F (36.6 C) (Oral)  Resp 20  SpO2 98% Physical Exam  Constitutional: He is oriented to person, place, and time. He appears well-developed and well-nourished.  HENT:  Head: Normocephalic and atraumatic.  Eyes: EOM are normal. Pupils are equal, round, and reactive to light.  Neck: Neck supple.  Cardiovascular: Normal rate, regular rhythm and intact distal pulses.   Pulmonary/Chest: Effort normal and breath sounds normal. No respiratory distress.  Abdominal: Soft. Bowel sounds are normal. He exhibits no distension. There is no tenderness.  Musculoskeletal: Normal range of motion. He exhibits no edema.  Neurological: He is alert and oriented to person, place, and time.  Skin: Skin is warm and dry.    ED Course  Procedures (including critical care time) Labs Review Labs Reviewed  CBC - Abnormal; Notable for the following:    MCHC 36.1 (*)    All other components within normal limits  BASIC METABOLIC PANEL - Abnormal; Notable for the following:    Sodium 136 (*)    Glucose, Bld 200 (*)    All other components within normal limits  PRO B NATRIURETIC PEPTIDE -  Abnormal; Notable for the following:    Pro B Natriuretic peptide (BNP) 156.3 (*)    All other components within normal limits  INFLUENZA PANEL BY PCR (TYPE A & B, H1N1) -  Abnormal; Notable for the following:    Influenza A By PCR POSITIVE (*)    All other components within normal limits  GLUCOSE, CAPILLARY - Abnormal; Notable for the following:    Glucose-Capillary 232 (*)    All other components within normal limits  GLUCOSE, CAPILLARY - Abnormal; Notable for the following:    Glucose-Capillary 198 (*)    All other components within normal limits  GLUCOSE, CAPILLARY - Abnormal; Notable for the following:    Glucose-Capillary 144 (*)    All other components within normal limits  GLUCOSE, CAPILLARY - Abnormal; Notable for the following:    Glucose-Capillary 203 (*)    All other components within normal limits  TROPONIN I  TROPONIN I  POCT I-STAT TROPONIN I   Imaging Review Dg Chest 2 View  11/07/2013   CLINICAL DATA:  Chest pain, cough, history of bronchitis.  EXAM: CHEST  2 VIEW  COMPARISON:  Chest radiograph September 30, 2012  FINDINGS: Cardiomediastinal silhouette is unremarkable and unchanged. Mild chronic interstitial changes, no pleural effusions or focal consolidations. Mild biapical thickening.  Surgical abdomen may reflect cholecystectomy. Mild degenerative change of thoracic spine. Soft tissue planes are nonsuspicious.  IMPRESSION: Mild chronic interstitial changes without superimposed acute cardiopulmonary process.   Electronically Signed   By: Elon Alas   On: 11/07/2013 01:22    EKG Interpretation    Date/Time:  Saturday November 06 2013 23:36:27 EST Ventricular Rate:  108 PR Interval:  172 QRS Duration: 114 QT Interval:  356 QTC Calculation: 477 R Axis:   -47 Text Interpretation:  Sinus tachycardia Nonspecific ST and T wave abnormality Premature ventricular complexes Left axis deviation Non-specific intra-ventricular conduction delay Abnormal ECG Confirmed by Adolf Ormiston  MD, Seline Enzor ([id number]) on 11/07/2013 12:12:11 AM           ASA. NTG MDM  Dx: CP, URI symptoms  ECG, labs, CXR MED admit  Teressa Lower, MD 11/08/13 [id number]

## 2013-11-07 NOTE — Progress Notes (Signed)

## 2013-11-07 NOTE — Progress Notes (Signed)
Subjective: [name] is still feeling quite tired today. Nasal swab was positive for influenza A. No further chest tightness. He does have a history of coronary artery disease and initial cardiac enzymes have been negative. Currently comfortable except for extreme fatigue. He is afebrile and not short of breath  Objective: Weight change:   Intake/Output Summary (Last 24 hours) at 11/07/13 0957 Last data filed at 11/07/13 0956  Gross per 24 hour  Intake    360 ml  Output   1250 ml  Net   -890 ml   Filed Vitals:   11/07/13 0315 11/07/13 0400 11/07/13 0445 11/07/13 0515  BP: 107/62 117/64 118/71 137/75  Pulse: 82 96 77 91  Temp:      TempSrc:      Resp: _0 SpO2: 96% 95% 96% 96%    General Appearance: Alert, cooperative, no distress, appears stated age, also very fatigued in appearance Lungs: Coarse breath sounds to auscultation bilaterally, respirations unlabored  Heart: Regular rate and rhythm, S1 and S2 normal, no murmur, rub or gallop Abdomen: Soft, non-tender, bowel sounds active all four quadrants, no masses, no organomegaly Extremities: Extremities normal, atraumatic, no cyanosis or edema Neuro: Alert and oriented, nonfocal  Lab Results: Results for orders placed during the hospital encounter of 11/07/13 (from the past 48 hour(s))  CBC     Status: Abnormal   Collection Time    11/06/13 11:58 PM      Result Value Range   WBC 6.0  4.0 - 10.5 K/uL   RBC 4.44  4.22 - 5.81 MIL/uL   Hemoglobin 14.4  13.0 - 17.0 g/dL   HCT 39.9  39.0 - 52.0 %   MCV 89.9  78.0 - 100.0 fL   MCH 32.4  26.0 - 34.0 pg   MCHC 36.1 (*) 30.0 - 36.0 g/dL   RDW 12.9  11.5 - 15.5 %   Platelets 152  150 - 400 K/uL  BASIC METABOLIC PANEL     Status: Abnormal   Collection Time    11/06/13 11:58 PM      Result Value Range   Sodium 136 (*) 137 - 147 mEq/L   Potassium 4.0  3.7 - 5.3 mEq/L   Chloride 96  96 - 112 mEq/L   CO2 27  19 - 32 mEq/L   Glucose, Bld 200 (*) 70 - 99 mg/dL   BUN 13  6 -  23 mg/dL   Creatinine, Ser 0.64  0.50 - 1.35 mg/dL   Calcium 10.0  8.4 - 10.5 mg/dL   GFR calc non Af Amer >90  >90 mL/min   GFR calc Af Amer >90  >90 mL/min   Comment: (NOTE)     The eGFR has been calculated using the CKD EPI equation.     This calculation has not been validated in all clinical situations.     eGFR's persistently <90 mL/min signify possible Chronic Kidney     Disease.  PRO B NATRIURETIC PEPTIDE     Status: Abnormal   Collection Time    11/06/13 11:58 PM      Result Value Range   Pro B Natriuretic peptide (BNP) 156.3 (*) 0 - 125 pg/mL  POCT I-STAT TROPONIN I     Status: None   Collection Time    11/07/13 12:16 AM      Result Value Range   Troponin i, poc 0.00  0.00 - 0.08 ng/mL   Comment 3  Comment: Due to the release kinetics of cTnI,     a negative result within the first hours     of the onset of symptoms does not rule out     myocardial infarction with certainty.     If myocardial infarction is still suspected,     repeat the test at appropriate intervals.  INFLUENZA PANEL BY PCR (TYPE A & B, H1N1)     Status: Abnormal   Collection Time    11/07/13  7:10 AM      Result Value Range   Influenza A By PCR POSITIVE (*) NEGATIVE   Influenza B By PCR NEGATIVE  NEGATIVE   H1N1 flu by pcr NOT DETECTED  NOT DETECTED   Comment:            The Xpert Flu assay (FDA approved for     nasal aspirates or washes and     nasopharyngeal swab specimens), is     intended as an aid in the diagnosis of     influenza and should not be used as     a sole basis for treatment.  GLUCOSE, CAPILLARY     Status: Abnormal   Collection Time    11/07/13  8:06 AM      Result Value Range   Glucose-Capillary 232 (*) 70 - 99 mg/dL    Studies/Results: Dg Chest 2 View  11/07/2013   CLINICAL DATA:  Chest pain, cough, history of bronchitis.  EXAM: CHEST  2 VIEW  COMPARISON:  Chest radiograph September 30, 2012  FINDINGS: Cardiomediastinal silhouette is unremarkable and unchanged.  Mild chronic interstitial changes, no pleural effusions or focal consolidations. Mild biapical thickening.  Surgical abdomen may reflect cholecystectomy. Mild degenerative change of thoracic spine. Soft tissue planes are nonsuspicious.  IMPRESSION: Mild chronic interstitial changes without superimposed acute cardiopulmonary process.   Electronically Signed   By: Elon Alas   On: 11/07/2013 01:22   Medications: Scheduled Meds: . aspirin EC  325 mg Oral Daily  . atorvastatin  10 mg Oral Daily  . docusate sodium  100 mg Oral BID  . glimepiride  1 mg Oral Q breakfast  . insulin aspart  0-15 Units Subcutaneous TID WC  . insulin aspart  0-5 Units Subcutaneous QHS  . metFORMIN  1,000 mg Oral BID WC  . metoprolol  50 mg Oral BID  . oseltamivir  75 mg Oral BID  . pantoprazole  40 mg Oral Daily  . ramipril  10 mg Oral Daily  . Rivaroxaban  20 mg Oral Q supper  . sodium chloride  3 mL Intravenous Q12H  . vitamin B-12  1,000 mcg Oral Daily   Continuous Infusions: . dextrose 5 % and 0.9% NaCl Stopped (11/07/13 0825)   PRN Meds:.diazepam, HYDROcodone-homatropine, nitroGLYCERIN, ondansetron (ZOFRAN) IV, ondansetron  Assessment/Plan: Principal Problem:   Chest pain - resolved and cardiac enzymes negative thus far Active Problems:   Hypertension - controlled   Hypercholesteremia - aware   Sleep apnea - aware   DM (diabetes mellitus), type 2, uncontrolled w/neurologic complication - aware   PAF (paroxysmal atrial fibrillation) - continues on Xarelto   Long term (current) use of anticoagulants - aware   Influenza due to identified novel influenza A virus with other respiratory manifestations - treat with Tamiflu 75 mg twice daily for 5 days   Disposition - discharge home later today or tomorrow morning depending on cardiac enzymes    LOS: 0 days   Henrine Screws, MD 11/07/2013,  9:57 AM

## 2013-11-07 NOTE — Progress Notes (Signed)
Pt complaining of N/V. Pt diaphoretic and states "I can't catch my breath". Vitals documented in doc flowsheet. O2 sats 92% on room air and placed on 2L via nasal cannula for comfort. HR 110s NSR. EKG obtained with slightly more pronounced TWI. CBG 142. Rapid response called for a drive by and oncall MD paged. Pt currently resting with eyes closed in bed. Will continue to monitor patient closely. Dorna Bloom, RN

## 2013-11-07 NOTE — ED Notes (Signed)
Pt returned from xray

## 2013-11-07 NOTE — ED Notes (Signed)
Pt transported to Xray. 

## 2013-11-07 NOTE — H&P (Signed)
[facility] History and Physical  [name] OIN:[id number] DOB: Apr 26, 1941    PCP:   Irven Shelling, MD   Chief Complaint:  coughs, chest pain.  HPI: [name] is an 73 y.o. male with hx of known CAD, s/p MIs, hyperlipidemia, AAA s/p repair, neuropathic pain, sleep apnea, presents to the ER with coughs, chest pain, and subjective fever for 2 days.  He was ill about christmas time, but recovered uneventfully after a few days.  He took his flu shot this year as well.  There has been no shortness of breath, diaphoresis, nausea or vomiting.  Evalaution in the ER included an EKG which showed ST, with no ischemic changes, CXR was clear, no leukocytosis, and normal renal fx tests.  Hospitalist was asked to admit him for chest pain R/out.  Rewiew of Systems:  Constitutional: Negative for malaise, fever and chills. No significant weight loss or weight gain Eyes: Negative for eye pain, redness and discharge, diplopia, visual changes, or flashes of light. ENMT: Negative for ear pain, hoarseness, nasal congestion, sinus pressure and sore throat. No headaches; tinnitus, drooling, or problem swallowing. Cardiovascular: Negative for  palpitations, diaphoresis, dyspnea and peripheral edema. ; No orthopnea, PND Respiratory: Negative for hemoptysis, wheezing and stridor. No pleuritic chestpain. Gastrointestinal: Negative for nausea, vomiting, diarrhea, constipation, abdominal pain, melena, blood in stool, hematemesis, jaundice and rectal bleeding.    Genitourinary: Negative for frequency, dysuria, incontinence,flank pain and hematuria; Musculoskeletal: Negative for back pain and neck pain. Negative for swelling and trauma.;  Skin: . Negative for pruritus, rash, abrasions, bruising and skin lesion.; ulcerations Neuro: Negative for headache, lightheadedness and neck stiffness. Negative for weakness, altered level of consciousness , altered mental status, extremity weakness, burning feet,  involuntary movement, seizure and syncope.  Psych: negative for anxiety, depression, insomnia, tearfulness, panic attacks, hallucinations, paranoia, suicidal or homicidal ideation    Past Medical History  Diagnosis Date  . Hypercholesteremia     takes lipitor daily  . AAA (abdominal aortic aneurysm)   . Pain     back pain chronic- seen at pain clinic  . Neuropathy     in legs  . Bronchitis     hx of  . Ileus   . Arthritis     BacK  . Incisional hernia     "small, from AAA"  . Coronary atherosclerosis of native coronary artery     ejection fraction of 40%, prior inferior myocardial infarction in February 1993, PTCA, LAD rotational atherectomy in August of 1998, acute inferolateral myocardial infarction, ventricular fibrillation in July of 1999, PTCA and stent of obtuse marginal 1, 100% RCA 1/12 with collaterals from patent circumflex and LAD  . Hypertension     takes Ramipril daily  . MI (myocardial infarction)     1992 and 1997 sees Dr. Linard Millers as needed   . Dysrhythmia     "got out of rhythym a couple , but it straightened back out."  . Sleep apnea     uses CPAP--sleep study done at least 68yrs ago  . Chronic back pain   . Dry skin   . Acid reflux     takes Prilosec and Protonix daily  . Diabetes mellitus     takes Metformin daily  . Complication of anesthesia     -years ago hair fell out.;ileus after 2 of his surgeries    Past Surgical History  Procedure Laterality Date  . Abdominal surgery    . Abdominal aortic aneurysm repair    .  Colonoscopy    . Cholecystectomy    . Back surgery  2013    Miminal Invasive x2 [place]  . Cardiac catheterization  2009/2012  . Coronary angioplasty      2 stents  . Coronary stent placement      Medications:  HOME MEDS: Prior to Admission medications   Medication Sig Start Date End Date Taking? Authorizing Provider  atorvastatin (LIPITOR) 10 MG tablet Take 10 mg by mouth daily.   Yes Historical Provider, MD  Coenzyme  Q10 (CO Q 10) 100 MG CAPS Take 100 mg by mouth daily.   Yes Historical Provider, MD  diazepam (VALIUM) 5 MG tablet Take 5 mg by mouth every 6 (six) hours as needed for muscle spasms.  06/25/13  Yes Historical Provider, MD  finasteride (PROPECIA) 1 MG tablet Take 1 mg by mouth daily.    Yes Historical Provider, MD  glimepiride (AMARYL) 1 MG tablet  07/20/13  Yes Historical Provider, MD  HYDROcodone-acetaminophen (NORCO/VICODIN) 5-325 MG per tablet Take 1 tablet by mouth every 6 (six) hours as needed for moderate pain.  06/30/13  Yes Historical Provider, MD  metFORMIN (GLUCOPHAGE) 1000 MG tablet Take 1,000 mg by mouth 2 (two) times daily with a meal.   Yes Historical Provider, MD  metoprolol (LOPRESSOR) 50 MG tablet Take 1 tablet (50 mg total) by mouth 2 (two) times daily. 08/13/13  Yes Darlin Coco, MD  Multiple Vitamin (MULTIVITAMIN WITH MINERALS) TABS Take 1 tablet by mouth daily.   Yes Historical Provider, MD  omeprazole (PRILOSEC) 20 MG capsule Take 20 mg by mouth daily. 09/14/12  Yes Historical Provider, MD  ramipril (ALTACE) 10 MG capsule Take 1 capsule by mouth daily. 11/05/13  Yes Historical Provider, MD  Rivaroxaban (XARELTO) 20 MG TABS tablet Take 1 tablet (20 mg total) by mouth daily with supper. 08/17/13  Yes Belva Crome III, MD  vitamin B-12 (CYANOCOBALAMIN) 1000 MCG tablet Take 1,000 mcg by mouth daily.    Yes Historical Provider, MD  ACCU-CHEK FASTCLIX LANCETS [place]  07/13/13   Historical Provider, MD  ACCU-CHEK SMARTVIEW test strip  07/30/13   Historical Provider, MD     Allergies:  Allergies  Allergen Reactions  . Cymbalta [Duloxetine Hcl] Nausea And Vomiting and Other (See Comments)    Rapid drop in blood pressure  . Gabapentin Other (See Comments)    Makes me goofy, keeps me off balance, clouds my thinking  . Lyrica [Pregabalin] Other (See Comments)    Makes me goofy, keeps me off balance, clouds my thinking  . Spinach Other (See Comments)    Patient does not like      Social History:   reports that he quit smoking about 21 years ago. He has never used smokeless tobacco. He reports that he drinks alcohol. He reports that he does not use illicit drugs.  Family History: Family History  Problem Relation Age of Onset  . Diabetes Mother   . Coronary artery disease Mother   . Diabetes Father   . Arthritis Father   . Rheum arthritis Father   . Coronary artery disease Father      Physical Exam: Filed Vitals:   11/06/13 2348 11/07/13 0245 11/07/13 0315 11/07/13 0400  BP: 121/72 130/65 107/62 117/64  Pulse:  82 82 96  Temp: 97.9 F (36.6 C)     TempSrc: Oral     Resp: 20 21 14 14   SpO2: 98% 97% 96% 95%   Blood pressure 117/64, pulse 96, temperature  97.9 F (36.6 C), temperature source Oral, resp. rate 14, SpO2 95.00%.  GEN:  Pleasant  patient lying in the stretcher in no acute distress; cooperative with exam. PSYCH:  alert and oriented x4; does not appear anxious or depressed; affect is appropriate. HEENT: Mucous membranes pink and anicteric; PERRLA; EOM intact; no cervical lymphadenopathy nor thyromegaly or carotid bruit; no JVD; There were no stridor. Neck is very supple. Breasts:: Not examined CHEST WALL: No tenderness CHEST: Normal respiration, clear to auscultation bilaterally.  HEART: Reg rate and rhythm.  There are no murmur, rub, or gallops.   BACK: No kyphosis or scoliosis; no CVA tenderness ABDOMEN: soft and non-tender; no masses, no organomegaly, normal abdominal bowel sounds; no pannus; no intertriginous candida. There is no rebound and no distention. Rectal Exam: Not done EXTREMITIES: No bone or joint deformity; age-appropriate arthropathy of the hands and knees; no edema; no ulcerations.  There is no calf tenderness. Genitalia: not examined PULSES: 2+ and symmetric SKIN: Normal hydration no rash or ulceration CNS: Cranial nerves 2-12 grossly intact no focal lateralizing neurologic deficit.  Speech is fluent; uvula elevated with  phonation, facial symmetry and tongue midline. DTR are normal bilaterally, cerebella exam is intact, barbinski is negative and strengths are equaled bilaterally.  No sensory loss.   Labs on Admission:  Basic Metabolic Panel:  Recent Labs Lab 11/06/13 2358  NA 136*  K 4.0  CL 96  CO2 27  GLUCOSE 200*  BUN 13  CREATININE 0.64  CALCIUM 10.0   Liver Function Tests: No results found for this basename: AST, ALT, ALKPHOS, BILITOT, PROT, ALBUMIN,  in the last 168 hours No results found for this basename: LIPASE, AMYLASE,  in the last 168 hours No results found for this basename: AMMONIA,  in the last 168 hours CBC:  Recent Labs Lab 11/06/13 2358  WBC 6.0  HGB 14.4  HCT 39.9  MCV 89.9  PLT 152   Cardiac Enzymes: No results found for this basename: CKTOTAL, CKMB, CKMBINDEX, TROPONINI,  in the last 168 hours  CBG: No results found for this basename: GLUCAP,  in the last 168 hours   Radiological Exams on Admission: Dg Chest 2 View  11/07/2013   CLINICAL DATA:  Chest pain, cough, history of bronchitis.  EXAM: CHEST  2 VIEW  COMPARISON:  Chest radiograph September 30, 2012  FINDINGS: Cardiomediastinal silhouette is unremarkable and unchanged. Mild chronic interstitial changes, no pleural effusions or focal consolidations. Mild biapical thickening.  Surgical abdomen may reflect cholecystectomy. Mild degenerative change of thoracic spine. Soft tissue planes are nonsuspicious.  IMPRESSION: Mild chronic interstitial changes without superimposed acute cardiopulmonary process.   Electronically Signed   By: Elon Alas   On: 11/07/2013 01:22    EKG: Independently reviewed. Sinus tach, PVC with no acute ischemic changes.   Assessment/Plan Present on Admission:  . Viral syndrome . Chest pain . Sleep apnea . PAF (paroxysmal atrial fibrillation) . DM (diabetes mellitus), type 2, uncontrolled w/neurologic complication . Hypertension . Hypercholesteremia  PLAN:  Will admit him for  chest pain r/out, but I don't think he has an ACS.  He does appear to have the influenza infection.  WIll place him on droplet precaution, obtain PCR, and start him on Tamiflu.  I will give some cough syrup.  For his DM, will continue his meds, place on carb modified diet, and add SSI.  For his HTN, will continue his meds.  His afib is rate controlled and will continue Xarelto.  He is  stable, full code, and will admit him to Dr Delene Ruffini service as per prior arrangement. Thank you for asking me to participate in his care.  Other plans as per orders.  Code Status: FULL Haskel Khan, MD. [facility] Pager [phone] 7pm to 7am.  11/07/2013, 4:52 AM

## 2013-11-08 MED ORDER — OSELTAMIVIR PHOSPHATE 75 MG PO CAPS: 75.0000 mg | ORAL_CAPSULE | Freq: Two times a day (BID) | ORAL | Status: DC

## 2013-11-08 NOTE — Discharge Summary (Signed)
Physician Discharge Summary  Patient ID: [name] MRN: [id number] DOB/AGE: 11-28-1940 73 y.o.  Admit date: 11/07/2013 Discharge date: 11/08/2013  Admission Diagnoses: Fever Chest pain Hypertension Diabetes mellitus type 2 Coronary artery disease Sleep apnea Hyperlipidemia Atrial fibrillation  Discharge Diagnoses:  Principal Problem:   Influenza A Active Problems: Noncardiac chest pain   Hypertension   Hypercholesteremia   Sleep apnea   DM (diabetes mellitus), type 2, uncontrolled w/neurologic complication   PAF (paroxysmal atrial fibrillation)   Long term (current) use of anticoagulants        Discharged Condition: good  Hospital Course: The patient was admitted on January 11 complaining of 2 days of subjective fever chest pain and cough. In the ER his chest x-ray was normal and EKG was unremarkable. Cardiac enzymes were negative. Influenza A test was positive. The patient was started on Tamiflu. He was given some gentle IV hydration. His cardiovascular problems remained stable. At discharge his oxygen saturation was in the 90s on room air. He was still feeling weak and coughing but was felt appropriate for discharge home.  Consults: None  Significant Diagnostic Studies: labs: All cardiac enzymes negative. Sodium 136 potassium 4.0 bicarbonate 27 chloride 96 glucose 200 BUN 13, creatinine 0.64 CBC WBC 6.0 hemoglobin 14.4 and platelet count 152 microbiology: Influenza A positive and radiology: CXR: Mild chronic interstitial changes without acute infiltrate  Treatments: IV hydration and antibiotics: Ancef  Discharge Exam: Blood pressure 93/41, pulse 74, temperature 97.7 F (36.5 C), temperature source Oral, resp. rate 18, SpO2 93.00%. Resp: clear to auscultation bilaterally Cardio: regular rate and rhythm, S1, S2 normal, no murmur, click, rub or gallop  Disposition: 01-Home or Self Care   Future Appointments Provider Department Dept Phone   03/28/2014 12:00 PM [facility] [phone]   04/26/2014 11:45 AM Sinclair Grooms, MD [facility] [phone]       Medication List         ACCU-CHEK FASTCLIX LANCETS Misc     ACCU-CHEK SMARTVIEW test strip  Generic drug:  glucose blood     atorvastatin 10 MG tablet  Commonly known as:  LIPITOR  Take 10 mg by mouth daily.     Co Q 10 100 MG Caps  Take 100 mg by mouth daily.     diazepam 5 MG tablet  Commonly known as:  VALIUM  Take 5 mg by mouth every 6 (six) hours as needed for muscle spasms.     finasteride 1 MG tablet  Commonly known as:  PROPECIA  Take 1 mg by mouth daily.     glimepiride 1 MG tablet  Commonly known as:  AMARYL     HYDROcodone-acetaminophen 5-325 MG per tablet  Commonly known as:  NORCO/VICODIN  Take 1 tablet by mouth every 6 (six) hours as needed for moderate pain.     metFORMIN 1000 MG tablet  Commonly known as:  GLUCOPHAGE  Take 1,000 mg by mouth 2 (two) times daily with a meal.     metoprolol 50 MG tablet  Commonly known as:  LOPRESSOR  Take 1 tablet (50 mg total) by mouth 2 (two) times daily.     multivitamin with minerals Tabs tablet  Take 1 tablet by mouth daily.     omeprazole 20 MG capsule  Commonly known as:  PRILOSEC  Take 20 mg by mouth daily.     oseltamivir 75 MG capsule  Commonly known as:  TAMIFLU  Take 1  capsule (75 mg total) by mouth 2 (two) times daily.     ramipril 10 MG capsule  Commonly known as:  ALTACE  Take 1 capsule by mouth daily.     Rivaroxaban 20 MG Tabs tablet  Commonly known as:  XARELTO  Take 1 tablet (20 mg total) by mouth daily with supper.     vitamin B-12 1000 MCG tablet  Commonly known as:  CYANOCOBALAMIN  Take 1,000 mcg by mouth daily.           Follow-up Information   Follow up with Irven Shelling, MD. (next scheduled visit)    Specialty:  Internal Medicine   Contact information:   [address] [place]  [address] [phone]       Signed: Irven Shelling 11/08/2013, 7:15 AM

## 2013-11-08 NOTE — Progress Notes (Signed)
Pt provided with dc instructions and education. Pt verbalized understanding. Pt provided with prescription. No questions at this time. VI removed with tip intact. Heart monitor cleaned and returned to front. Dorna Bloom, RN

## 2013-11-16 ENCOUNTER — Telehealth: Payer: Self-pay | Admitting: Interventional Cardiology

## 2013-11-16 ENCOUNTER — Telehealth: Payer: Self-pay | Admitting: *Deleted

## 2013-11-16 NOTE — Telephone Encounter (Signed)
Patient requests xarelto samples. He is aware that they will be left at the front desk for pick up.

## 2013-11-16 NOTE — Telephone Encounter (Signed)
returned pt call.[email or web address] if pt is doing well he can call back in the morning to cancel.appt can be moved out 2-3 mo.if pt has concerns then he should keep his appt for 11/17/13

## 2013-11-16 NOTE — Telephone Encounter (Signed)
New message         Pt wants to know if he should keep his upcoming appointment for tomorrow. Last seen in Nov 2014

## 2013-11-17 ENCOUNTER — Ambulatory Visit: Payer: Medicare Other | Admitting: Interventional Cardiology

## 2013-12-10 IMAGING — RF DG C-ARM 61-120 MIN
1 series · 2 of 2 positions shown · non-contrast
Comparison: Intraoperative imaging of same date.  MRI of
02/28/2012.

CLINICAL DATA: L3-L5 FIXATION.

DG C-ARM 1-60 MIN,LUMBAR SPINE - 2-3 VIEW

[Series 1: run · 2 of 2 slices shown]
[im 1/2]
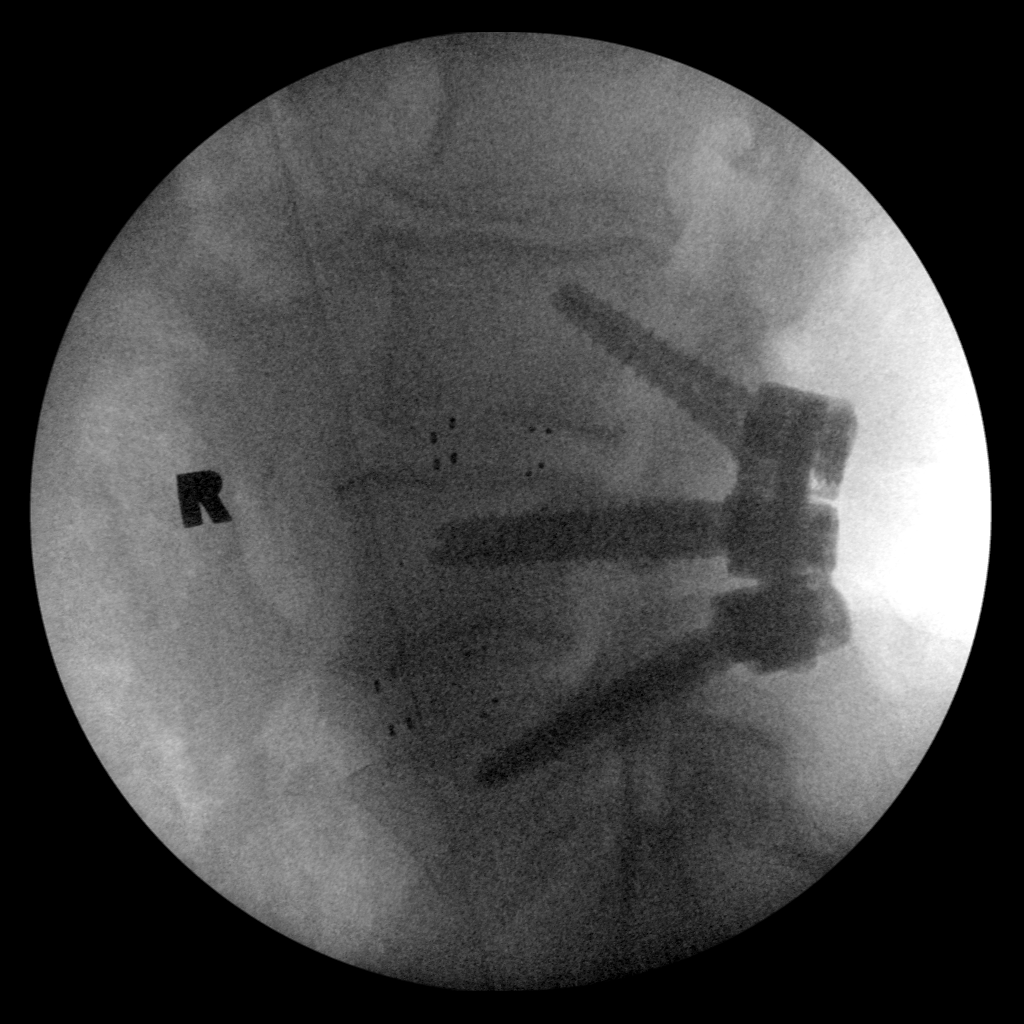
[im 2/2]
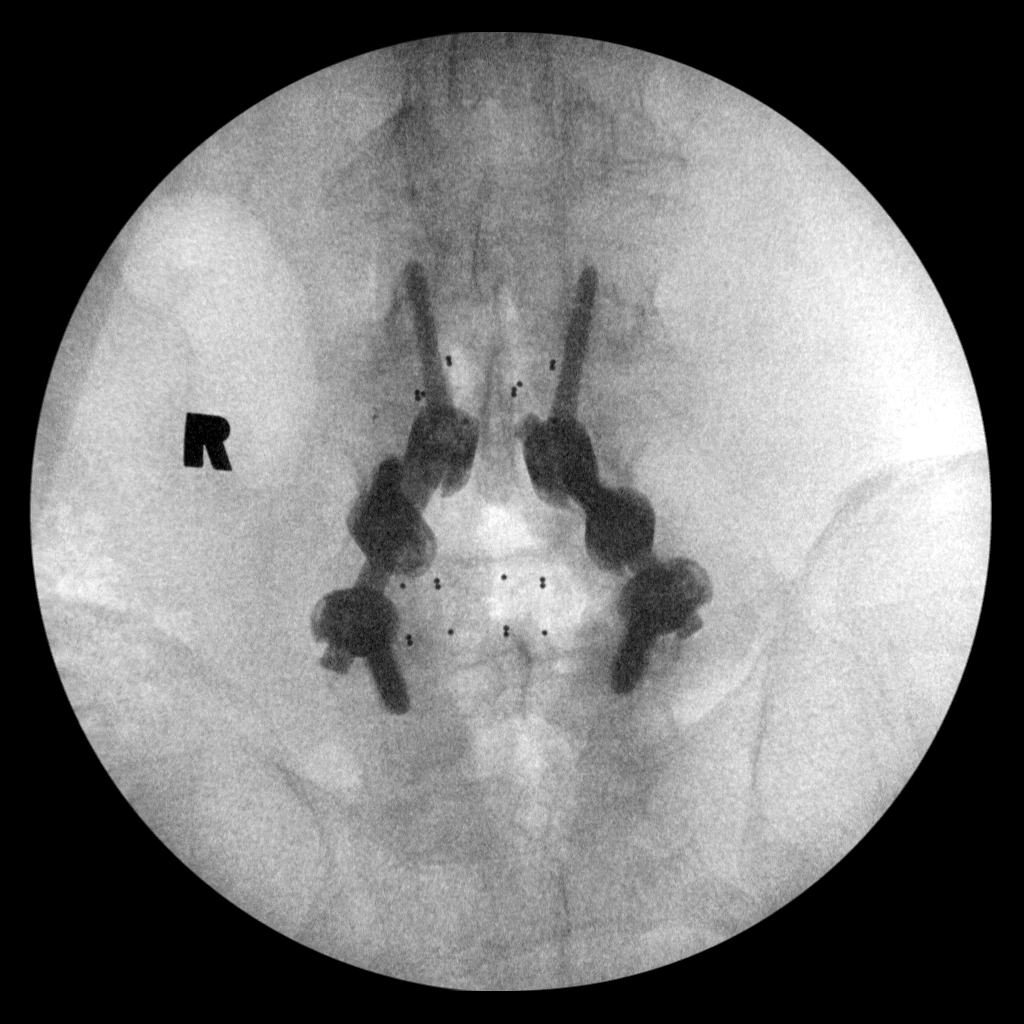

[2 of 2 positions shown; findings below may reference images not displayed]

FINDINGS: Intraoperative AP and lateral images.  These demonstrate
trans pedicle screw fixation at 3 levels.  Favored to be L3-L5.
The lateral view is degraded by underpenetration.  Interbody fusion
material centrally positioned.  No gross acute hardware
complication identified.
IMPRESSION: Intraoperative imaging of lumbar fixation, likely L3-L5.

## 2013-12-10 IMAGING — CR DG LUMBAR SPINE 2-3V
1 series · 1 of 1 positions shown · non-contrast
Comparison: MRI lumbar spine 02/28/2012 [HOSPITAL].

CLINICAL DATA: L3-L5 PLIF.

OPERATIVE LUMBAR SPINE - 2-3 VIEW

[view not recorded]
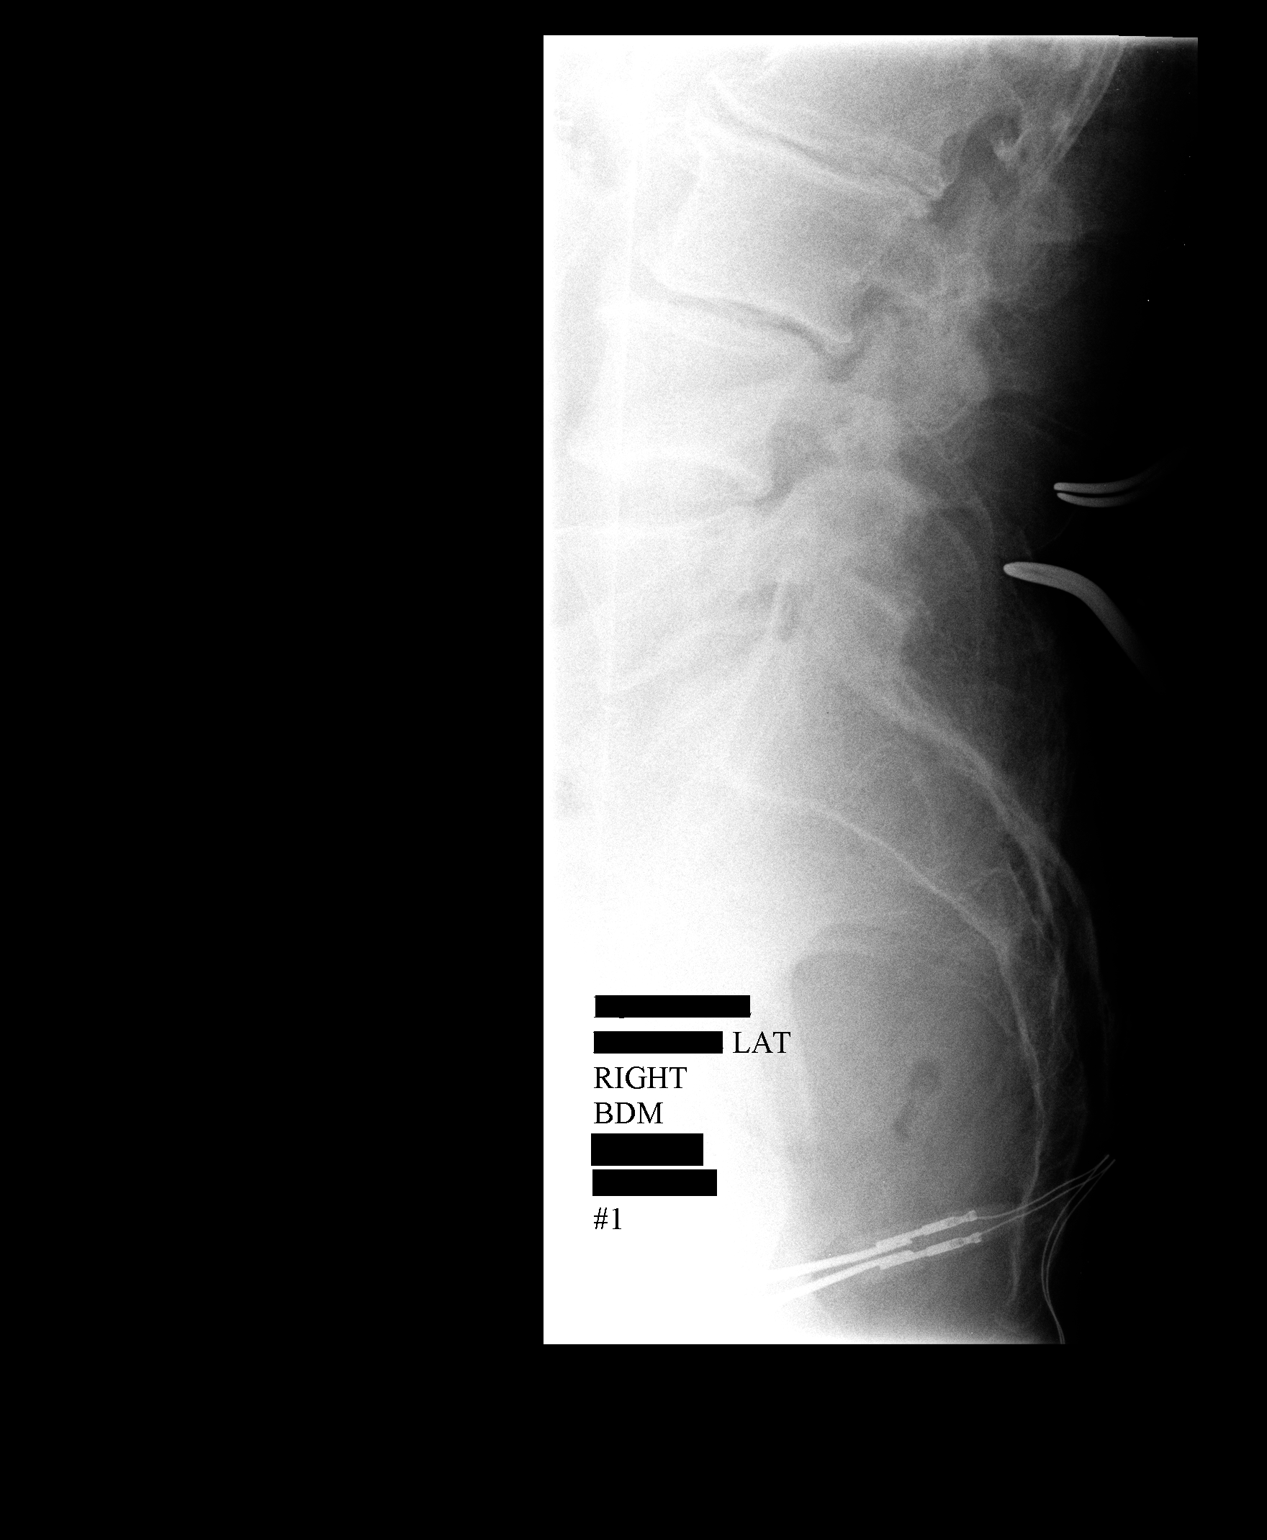

[1 of 1 positions shown; findings below may reference images not displayed]

FINDINGS: Images were submitted for interpretation post-
operatively.  The same numbering scheme will be utilized as on the
prior MRI, with L5 representing the last lumbar segment.

Initial image at 9169 hours demonstrates clamps on the spinous
processes of L4 and L5.  Second image at 6436 hours demonstrates
the localizer probe projected over the L3-4 disc space.
IMPRESSION: L3-4 localized intraoperatively.

## 2013-12-12 IMAGING — CR DG ABDOMEN ACUTE W/ 1V CHEST
3 series · 3 of 3 positions shown · non-contrast
Comparison: 01/01/2012.

CLINICAL DATA: 71-year-old male with abdominal distention pain
nausea and vomiting.

ACUTE ABDOMEN SERIES (ABDOMEN 2 VIEW & CHEST 1 VIEW)

[w abdomen decub]
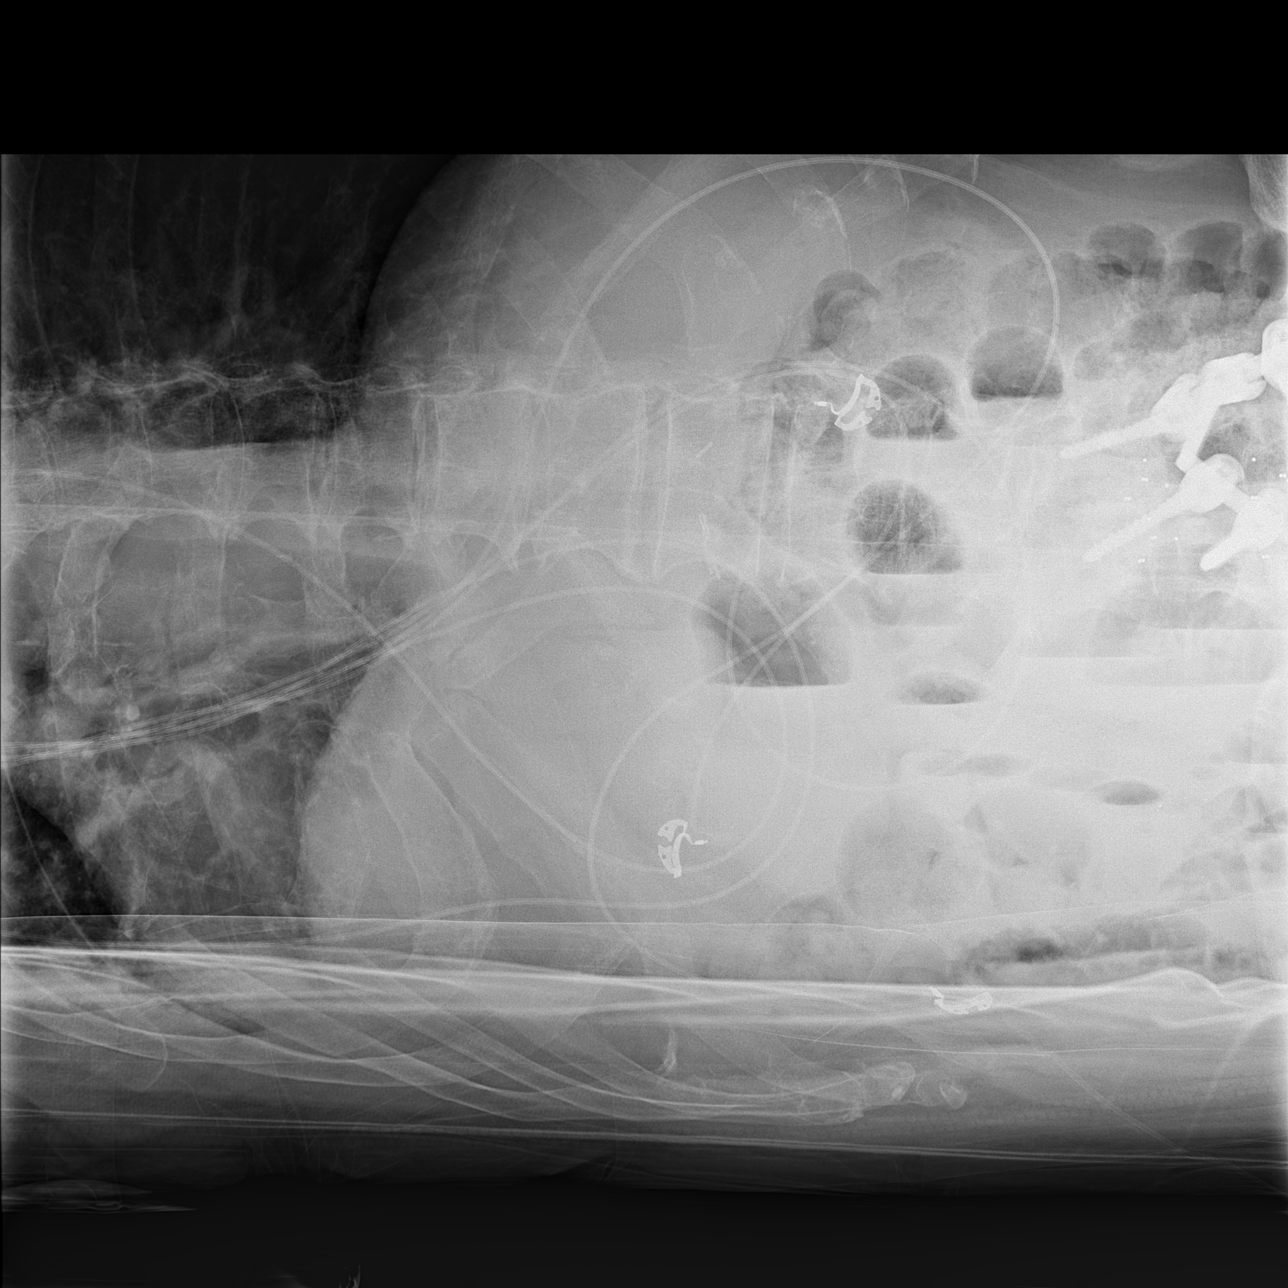

[x abdomen supine]
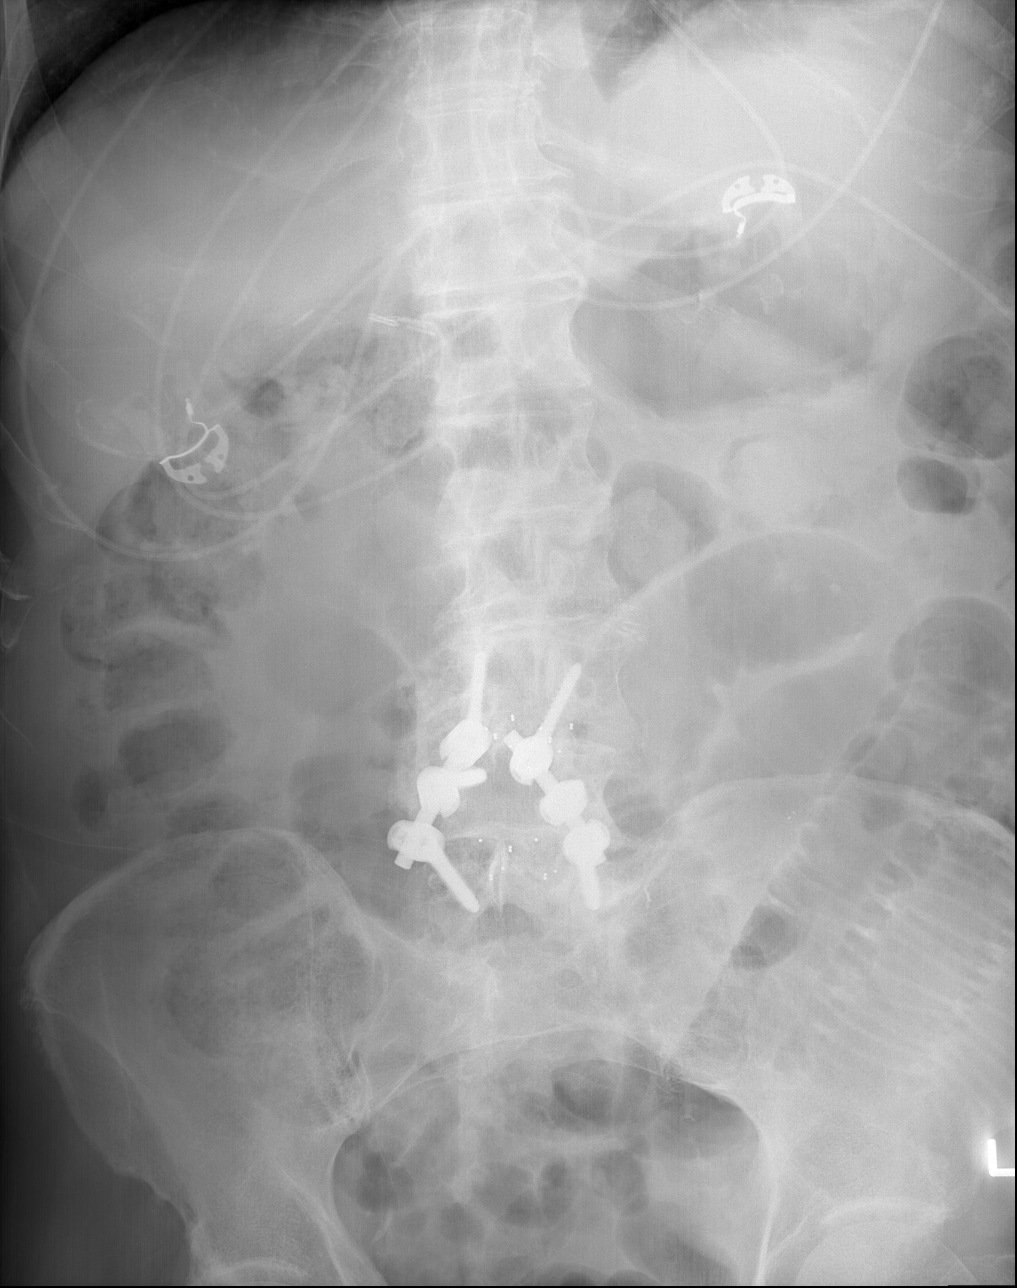

[x chest ap]
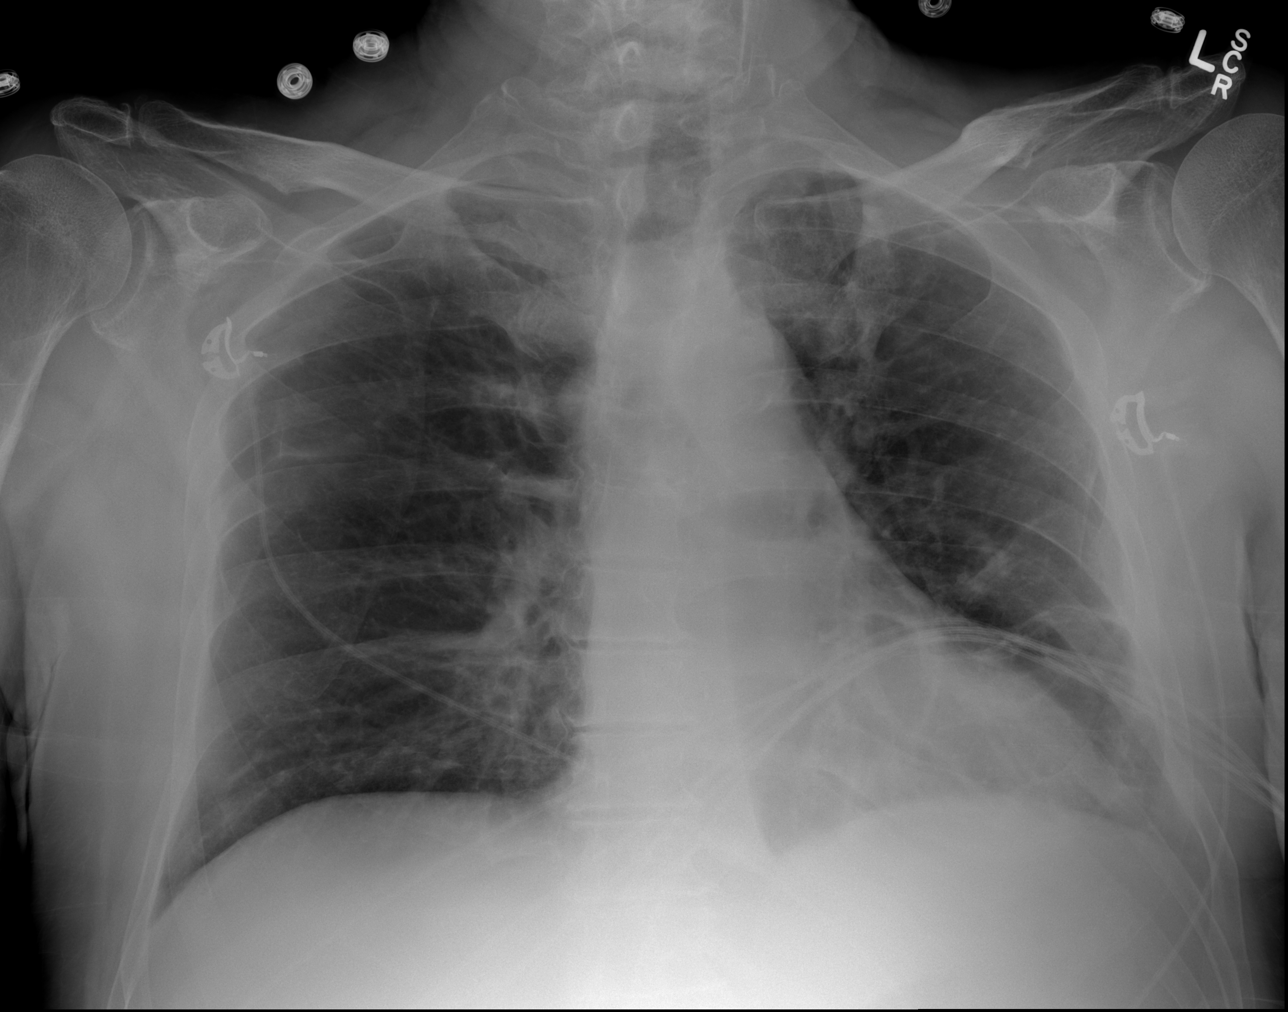

[3 of 3 positions shown; findings below may reference images not displayed]

FINDINGS: Slightly lower lung volumes.  Stable cardiac size and
mediastinal contours.  Visualized tracheal air column is within
normal limits.  No pneumothorax or pneumoperitoneum.  No
significant change in mild retrocardiac streaky opacity.

New prominent gas filled small bowel in the left lower quadrant, up
to 48 mm diameter.  Less dilated mid abdominal gas filled small
bowel.  Distal small bowel appears decompressed.  There is gas in
the colon.  Postoperative changes to the spine are new since [REDACTED].
Air-fluid levels in the small bowel on the lateral decubitus view.
IMPRESSION: 1.  No free air.  Bowel gas pattern could reflect either early or
partial small bowel obstruction versus small bowel ileus.
2.  Mild retrocardiac atelectasis.

## 2013-12-15 IMAGING — CR DG ABDOMEN ACUTE W/ 1V CHEST
5 series · 5 of 5 positions shown · non-contrast
Comparison: 05/07/2012

CLINICAL DATA: Abdominal pain and distention. Nausea and vomiting.
Small bowel obstruction.

ACUTE ABDOMEN SERIES (ABDOMEN 2 VIEW & CHEST 1 VIEW)

[x chest ap (1 of 2)]
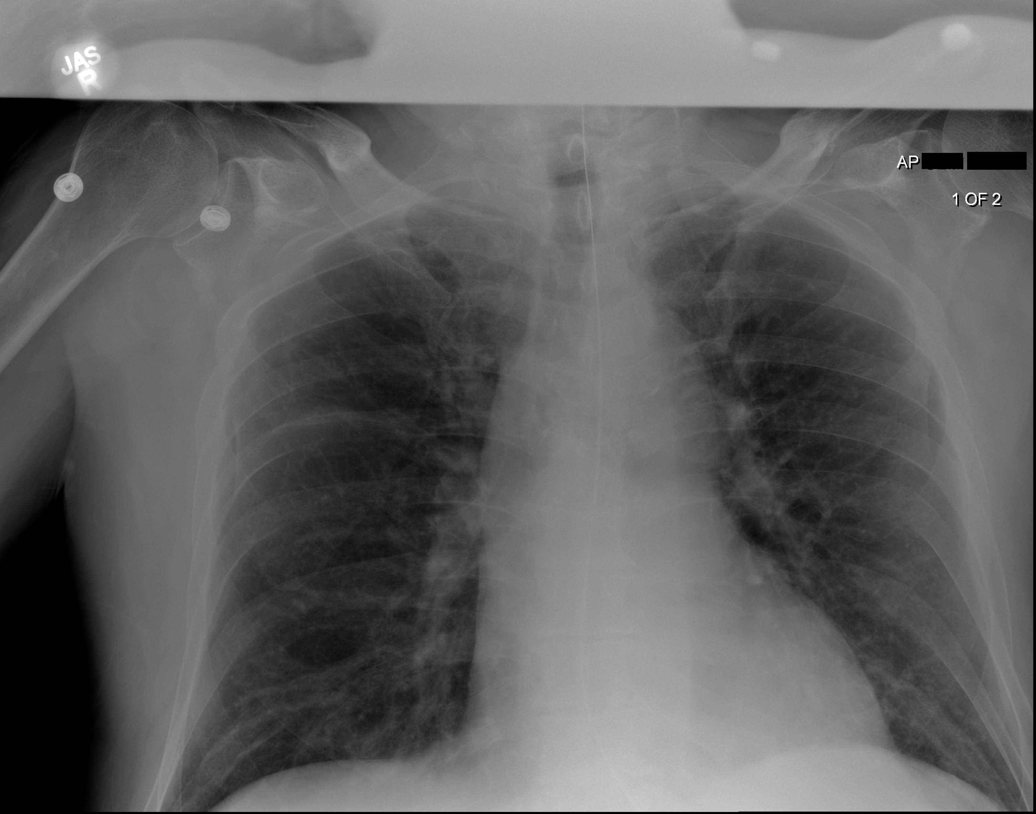

[x chest ap (2 of 2)]
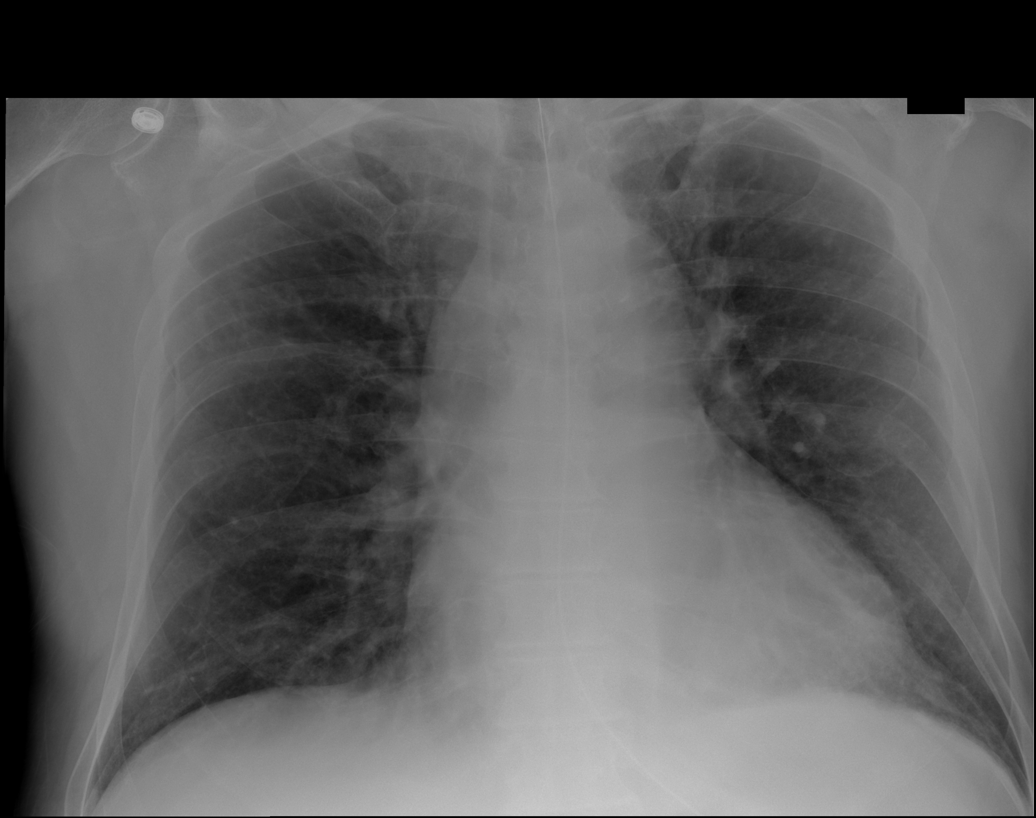

[w abdomen decub (1 of 2)]
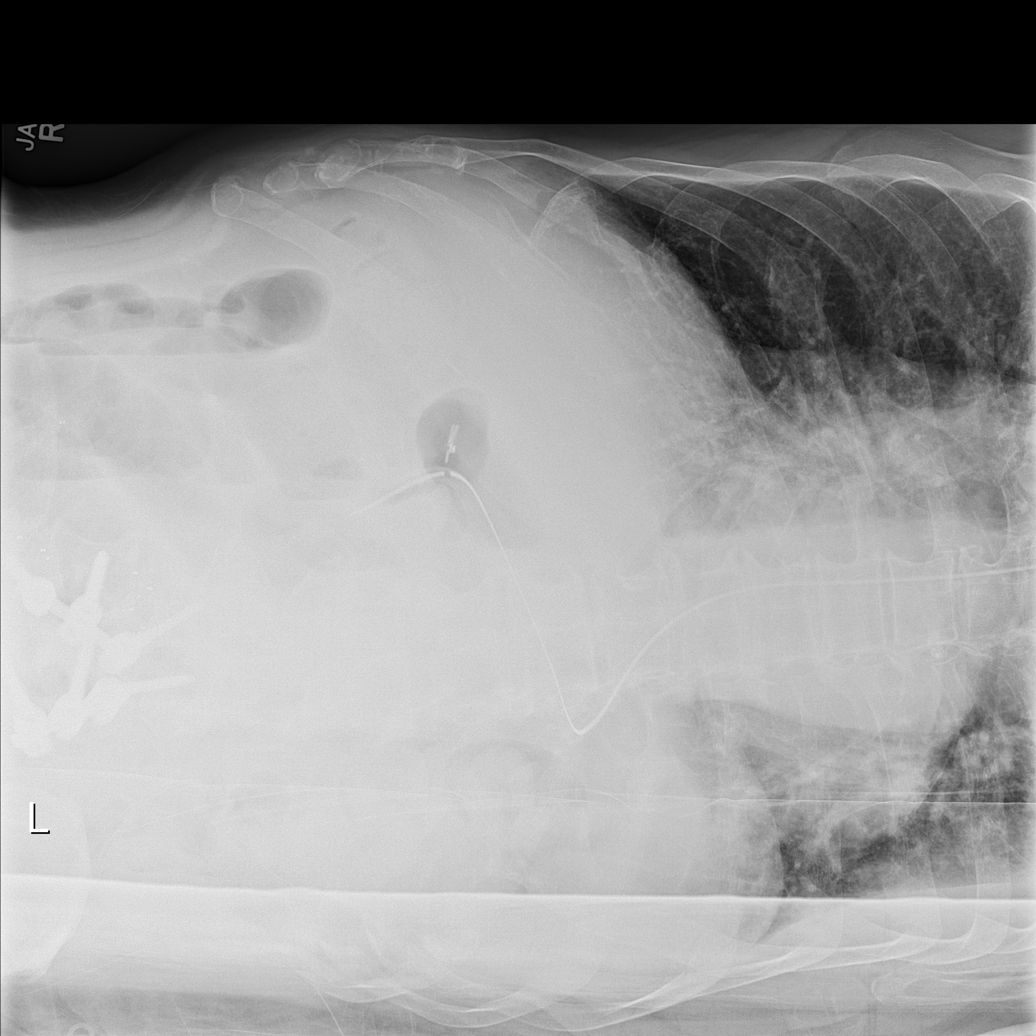

[w abdomen decub (2 of 2)]
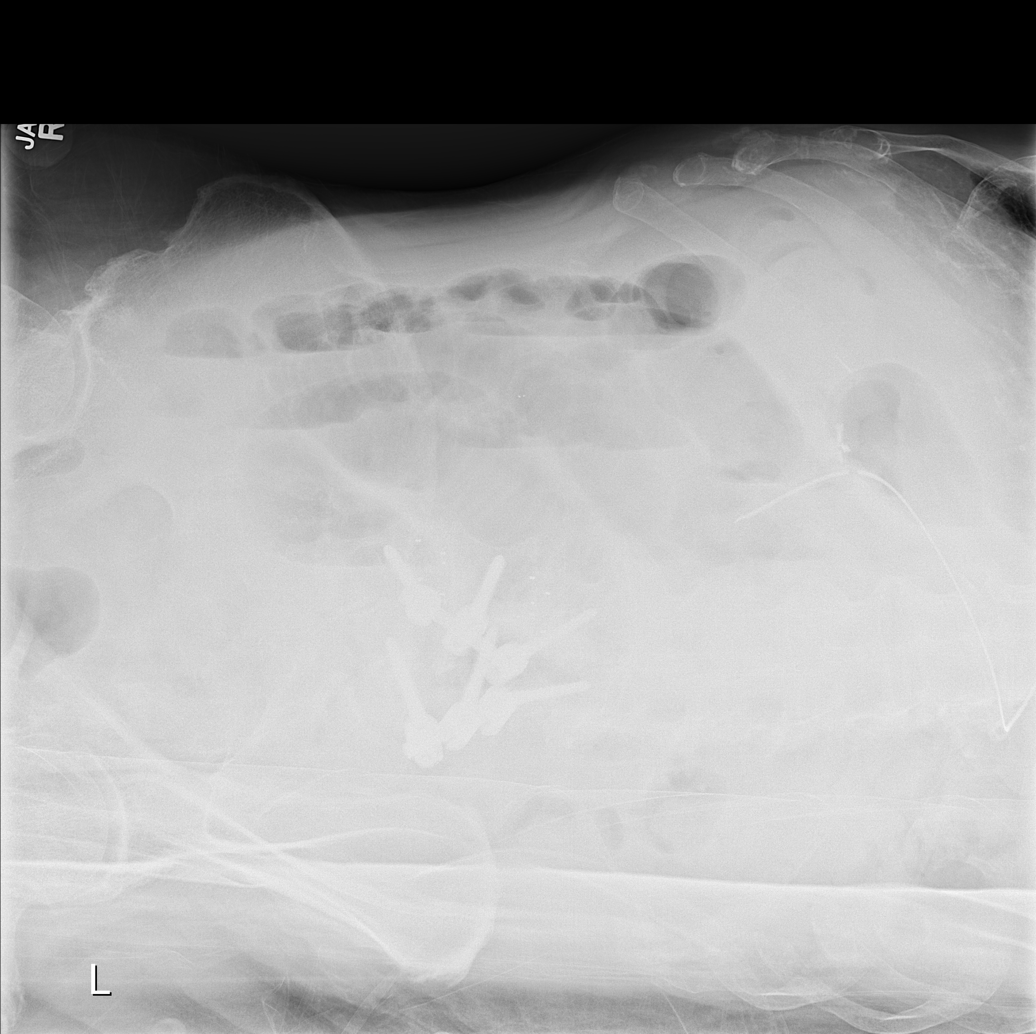

[x abdomen supine]
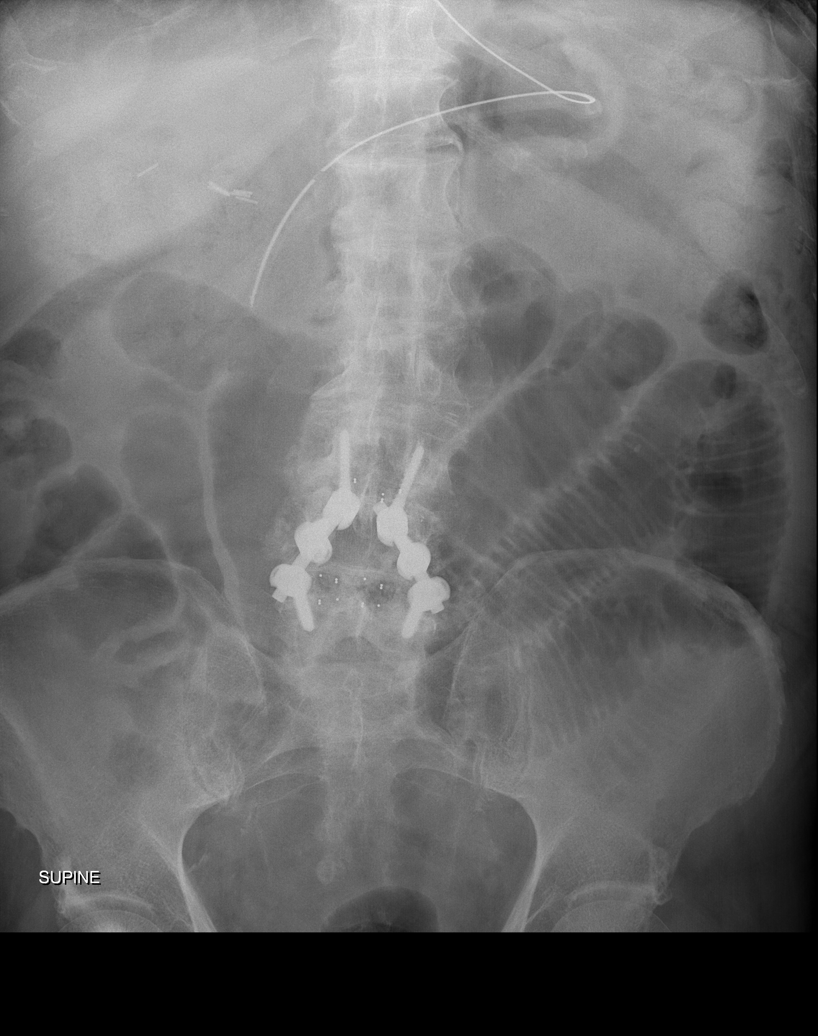

[5 of 5 positions shown; findings below may reference images not displayed]

FINDINGS: There is been placement nasogastric tube since previous
study, with tip seen overlying the descending duodenum.  Multiple
dilated bowel loops are again seen within the left lower quadrant
which show no significant change.  Decreased colonic gas is
demonstrated.  This is consistent with a distal small bowel
obstruction.

There is no evidence of free air.  Right upper quadrant surgical
clips seen from prior cholecystectomy.  Previous lumbar spine
fusion also noted.

Heart size is stable.  Both lungs are clear.  No evidence of
pleural effusion.
IMPRESSION: 1.  Stable appearance of distal small bowel obstruction.  New
nasogastric tube tip in proximal duodenum.
2.  No active lung disease.

## 2013-12-30 ENCOUNTER — Other Ambulatory Visit: Payer: Self-pay | Admitting: Cardiology

## 2014-01-18 ENCOUNTER — Telehealth: Payer: Self-pay | Admitting: *Deleted

## 2014-01-18 NOTE — Telephone Encounter (Signed)
Patient requests xarelto samples. I will place them at the front desk for pick up.

## 2014-01-19 ENCOUNTER — Encounter: Payer: Self-pay | Admitting: Interventional Cardiology

## 2014-01-19 ENCOUNTER — Ambulatory Visit (INDEPENDENT_AMBULATORY_CARE_PROVIDER_SITE_OTHER): Payer: Medicare Other | Admitting: Interventional Cardiology

## 2014-01-19 VITALS — BP 135/79 | HR 76 | Ht 73.5 in | Wt 209.0 lb

## 2014-01-19 DIAGNOSIS — G4730 Sleep apnea, unspecified: Secondary | ICD-10-CM

## 2014-01-19 DIAGNOSIS — E7800 Pure hypercholesterolemia, unspecified: Secondary | ICD-10-CM

## 2014-01-19 DIAGNOSIS — I480 Paroxysmal atrial fibrillation: Secondary | ICD-10-CM

## 2014-01-19 DIAGNOSIS — I4891 Unspecified atrial fibrillation: Secondary | ICD-10-CM

## 2014-01-19 DIAGNOSIS — Z7901 Long term (current) use of anticoagulants: Secondary | ICD-10-CM

## 2014-01-19 DIAGNOSIS — I10 Essential (primary) hypertension: Secondary | ICD-10-CM

## 2014-01-19 DIAGNOSIS — I2510 Atherosclerotic heart disease of native coronary artery without angina pectoris: Secondary | ICD-10-CM

## 2014-01-19 DIAGNOSIS — I5042 Chronic combined systolic (congestive) and diastolic (congestive) heart failure: Secondary | ICD-10-CM

## 2014-01-19 NOTE — Progress Notes (Signed)
Patient ID: [name], male   DOB: 03-Nov-1940, 73 y.o.   MRN: [id number]    [address], [address] [place], [place]  [address] Phone: [phone] Fax:  [phone]  Date:  01/19/2014   ID:  [name], DOB 1941-08-27, MRN [id number]  PCP:  Irven Shelling, MD   ASSESSMENT:  1. Paroxysmal atrial fibrillation, currently in sinus rhythm 2. Chronic diastolic heart failure, stable 3. Coronary atherosclerosis without angina 4. Chronic anticoagulation therapy, stable without bleeding  PLAN:  1. No change in current therapy 2. Clinical followup in 9-12 months 3. Encouraged to physical activity as tolerated by his back   SUBJECTIVE: [name] is a 73 y.o. male who denies any cardiopulmonary complaints. Still being limited by lower back discomfort. No bleeding on current anticoagulation regimen. Denies angina. No dyspnea.   Wt Readings from Last 3 Encounters:  01/19/14 209 lb (94.802 kg)  09/08/13 205 lb (92.987 kg)  08/16/13 209 lb (94.802 kg)     Past Medical History  Diagnosis Date  . Hypercholesteremia     takes lipitor daily  . AAA (abdominal aortic aneurysm)   . Pain     back pain chronic- seen at pain clinic  . Neuropathy     in legs  . Bronchitis     hx of  . Ileus   . Arthritis     BacK  . Incisional hernia     "small, from AAA"  . Coronary atherosclerosis of native coronary artery     ejection fraction of 40%, prior inferior myocardial infarction in February 1993, PTCA, LAD rotational atherectomy in August of 1998, acute inferolateral myocardial infarction, ventricular fibrillation in July of 1999, PTCA and stent of obtuse marginal 1, 100% RCA 1/12 with collaterals from patent circumflex and LAD  . Hypertension     takes Ramipril daily  . MI (myocardial infarction)     1992 and 1997 sees Dr. Linard Millers as needed   . Dysrhythmia     "got out of rhythym a couple , but it straightened back out."  . Sleep apnea     uses CPAP--sleep study done at  least 61yrs ago  . Chronic back pain   . Dry skin   . Acid reflux     takes Prilosec and Protonix daily  . Diabetes mellitus     takes Metformin daily  . Complication of anesthesia     -years ago hair fell out.;ileus after 2 of his surgeries    Current Outpatient Prescriptions  Medication Sig Dispense Refill  . ACCU-CHEK FASTCLIX LANCETS MISC       . ACCU-CHEK SMARTVIEW test strip       . atorvastatin (LIPITOR) 10 MG tablet Take 10 mg by mouth daily.      . Coenzyme Q10 (CO Q 10) 100 MG CAPS Take 100 mg by mouth daily.      . diazepam (VALIUM) 5 MG tablet Take 5 mg by mouth every 6 (six) hours as needed for muscle spasms.       . finasteride (PROPECIA) 1 MG tablet Take 1 mg by mouth daily.       [name] glimepiride (AMARYL) 1 MG tablet       . metFORMIN (GLUCOPHAGE) 1000 MG tablet Take 1,000 mg by mouth 2 (two) times daily with a meal.      . metoprolol (LOPRESSOR) 50 MG tablet TAKE ONE TABLET BY MOUTH TWICE DAILY  60 tablet  0  .  Multiple Vitamin (MULTIVITAMIN WITH MINERALS) TABS Take 1 tablet by mouth daily.      [name] omeprazole (PRILOSEC) 20 MG capsule Take 20 mg by mouth daily.      . ramipril (ALTACE) 10 MG capsule Take 1 capsule by mouth daily.      . Rivaroxaban (XARELTO) 20 MG TABS tablet Take 1 tablet (20 mg total) by mouth daily with supper.  30 tablet  11  . vitamin B-12 (CYANOCOBALAMIN) 1000 MCG tablet Take 1,000 mcg by mouth daily.       . [DISCONTINUED] promethazine (PHENERGAN) 25 MG tablet Take 25 mg by mouth every 6 (six) hours as needed.       No current facility-administered medications for this visit.    Allergies:    Allergies  Allergen Reactions  . Cymbalta [Duloxetine Hcl] Nausea And Vomiting and Other (See Comments)    Rapid drop in blood pressure  . Gabapentin Other (See Comments)    Makes me goofy, keeps me off balance, clouds my thinking  . Lyrica [Pregabalin] Other (See Comments)    Makes me goofy, keeps me off balance, clouds my thinking  . Spinach Other  (See Comments)    Patient does not like     Social History:  The patient  reports that he quit smoking about 22 years ago. He has never used smokeless tobacco. He reports that he drinks alcohol. He reports that he does not use illicit drugs.   ROS:  Please see the history of present illness.      All other systems reviewed and negative.   OBJECTIVE: VS:  BP 135/79  Pulse 76  Ht 6' 1.5" (1.867 m)  Wt 209 lb (94.802 kg)  BMI 27.20 kg/m2 Well nourished, well developed, in no acute distress, elderly HEENT: normal Neck: JVD flat. Carotid bruit absent  Cardiac:  normal S1, S2; RRR; no murmur Lungs:  clear to auscultation bilaterally, no wheezing, rhonchi or rales Abd: soft, nontender, no hepatomegaly Ext: Edema absent. Pulses 2+ bilateral Skin: warm and dry Neuro:  CNs 2-12 intact, no focal abnormalities noted  EKG:  Not repeated       Signed, Illene Labrador III, MD 01/19/2014 1:02 PM

## 2014-01-19 NOTE — Patient Instructions (Signed)
Your physician recommends that you continue on your current medications as directed. Please refer to the Current Medication list given to you today.  Your physician wants you to follow-up in: 9 months You will receive a reminder letter in the mail two months in advance. If you don't receive a letter, please call our office to schedule the follow-up appointment.

## 2014-01-26 ENCOUNTER — Other Ambulatory Visit: Payer: Self-pay

## 2014-01-26 MED ORDER — METOPROLOL TARTRATE 50 MG PO TABS: ORAL_TABLET | ORAL | Status: DC

## 2014-03-15 ENCOUNTER — Encounter: Payer: Self-pay | Admitting: Interventional Cardiology

## 2014-03-28 ENCOUNTER — Ambulatory Visit (INDEPENDENT_AMBULATORY_CARE_PROVIDER_SITE_OTHER): Payer: Medicare Other | Admitting: *Deleted

## 2014-03-28 DIAGNOSIS — I4891 Unspecified atrial fibrillation: Secondary | ICD-10-CM

## 2014-03-28 LAB — CBC
HCT: 40.3 % (ref 39.0–52.0)
Hemoglobin: 13.8 g/dL (ref 13.0–17.0)
MCHC: 34.2 g/dL (ref 30.0–36.0)
MCV: 92 fl (ref 78.0–100.0)
Platelets: 177 10*3/uL (ref 150.0–400.0)
RBC: 4.38 Mil/uL (ref 4.22–5.81)
RDW: 13.8 % (ref 11.5–15.5)
WBC: 5 10*3/uL (ref 4.0–10.5)

## 2014-03-28 LAB — BASIC METABOLIC PANEL
BUN: 13 mg/dL (ref 6–23)
CO2: 28 mEq/L (ref 19–32)
Calcium: 10 mg/dL (ref 8.4–10.5)
Chloride: 102 mEq/L (ref 96–112)
Creatinine, Ser: 0.8 mg/dL (ref 0.4–1.5)
GFR: 106.25 mL/min (ref >60.00)
Glucose, Bld: 158 mg/dL — ABNORMAL HIGH (ref 70–99)
Potassium: 4.7 mEq/L (ref 3.5–5.1)
Sodium: 138 mEq/L (ref 135–145)

## 2014-03-29 ENCOUNTER — Encounter: Payer: Self-pay | Admitting: *Deleted

## 2014-03-29 NOTE — Progress Notes (Signed)
Pt was started on Xarelto for Afib on 08/13/2013.    Reviewed patients medication list.  Pt is not  currently on any combined P-gp and strong CYP3A4 inhibitors/inducers (ketoconazole, traconazole, ritonavir, carbamazepine, phenytoin, rifampin, St. John's wort).  Reviewed labs.  SCr 0.8 , Weight 94 Kg, CrCl-106.  Dose  Appropriate  based on CrCl.  Hgb and HCT 13.8/40.3.   A full discussion of the nature of anticoagulants has been carried out.  A benefit/risk analysis has been presented to the patient, so that they understand the justification for choosing anticoagulation with Xarelto at this time.  The need for compliance is stressed.  Pt is aware to take the medication once daily with the largest meal of the day.  Side effects of potential bleeding are discussed, including unusual colored urine or stools, coughing up blood or coffee ground emesis, nose bleeds or serious fall or head trauma.  Discussed signs and symptoms of stroke. The patient should avoid any OTC items containing aspirin or ibuprofen.  Avoid alcohol consumption.   Call if any signs of abnormal bleeding.  Discussed financial obligations and resolved any difficulty in obtaining medication.  Next lab test test in 6 months.

## 2014-04-04 ENCOUNTER — Telehealth: Payer: Self-pay

## 2014-04-04 NOTE — Telephone Encounter (Signed)
Message copied by Lamar Laundry on Mon Apr 04, 2014 11:53 AM ------      Message from: Daneen Schick      Created: Wed Mar 30, 2014  4:51 PM       Labs are normal. ------

## 2014-04-04 NOTE — Telephone Encounter (Signed)
pt given lab results.  Labs are normal. pt verbalized understanding

## 2014-04-26 ENCOUNTER — Ambulatory Visit: Payer: Medicare Other | Admitting: Interventional Cardiology

## 2014-04-29 IMAGING — CT CT L SPINE W/O CM
4 of 10 series · 12 of 33 positions shown, 14 images · non-contrast
Comparison: Intraoperative views of the lumbar spine 05/05/2012,
two views of the lumbar spine 09/16/2012 and MRI 02/28/2012.

CLINICAL DATA: Status post lumbar fusion 05/05/2012.  Right leg
radiculopathy.

CT LUMBAR SPINE WITHOUT CONTRAST
TECHNIQUE: Multidetector CT imaging of the lumbar spine was
performed without intravenous contrast administration. Multiplanar
CT image reconstructions were also generated.

[Series 2: l spine bone · axial · 0.30mm/px · z∈[+20,+98]mm · 2 of 93 slices shown, 3 images]
[im 31/93  soft-tissue]
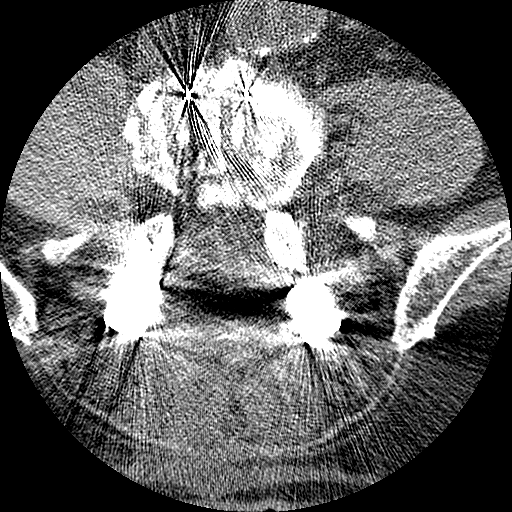
[im 31/93  bone]
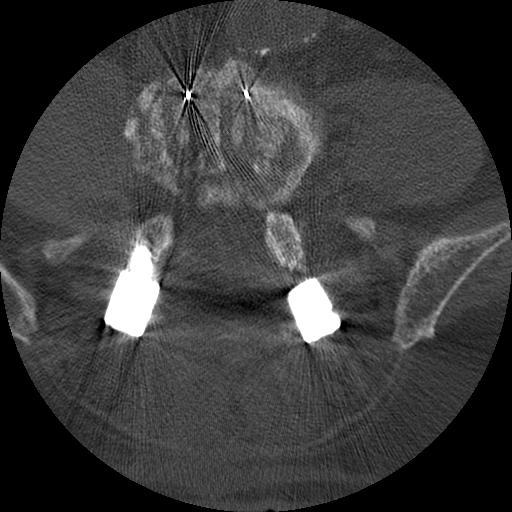
[im 62/93  bone]
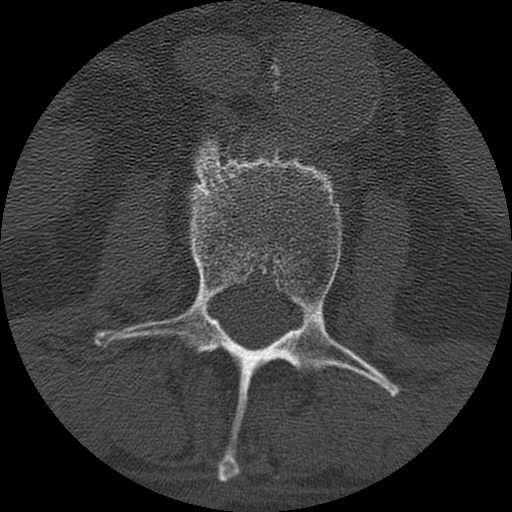

[Series 3: l spine soft · axial · 0.30mm/px · z∈[+20,+98]mm · 2 of 93 slices shown]
[im 31/93  soft-tissue]
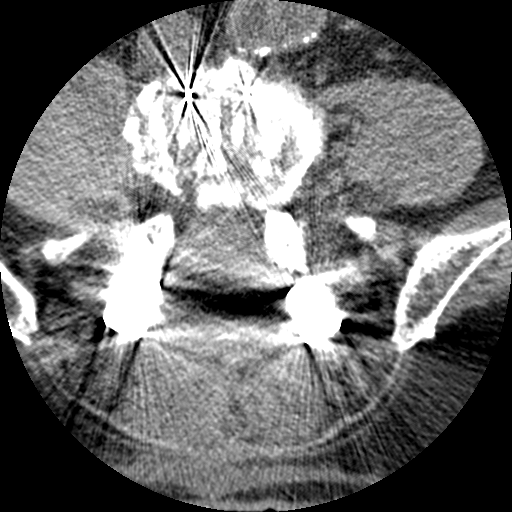
[im 62/93  soft-tissue]
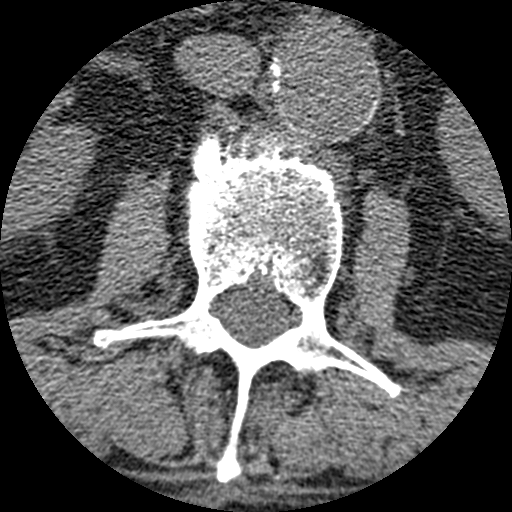

[Series 400: coronal · coronal · 0.46mm/px · 3 of 60 slices shown]
[im 12/60  bone]
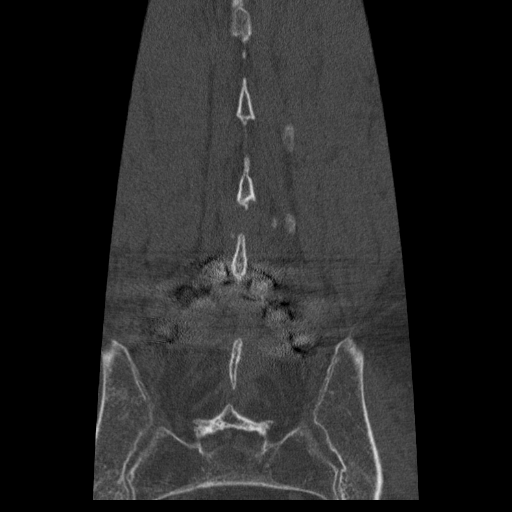
[im 24/60  bone]
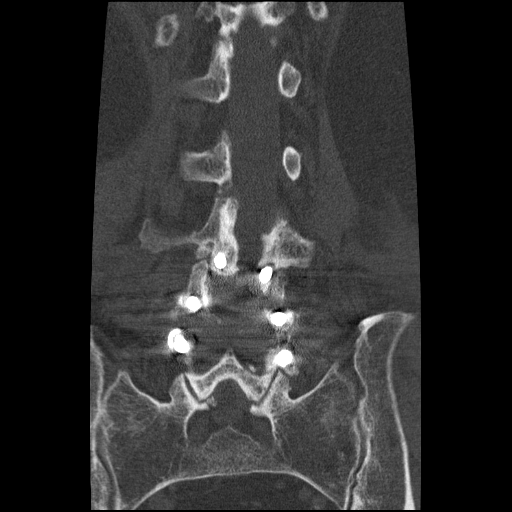
[im 36/60  bone]
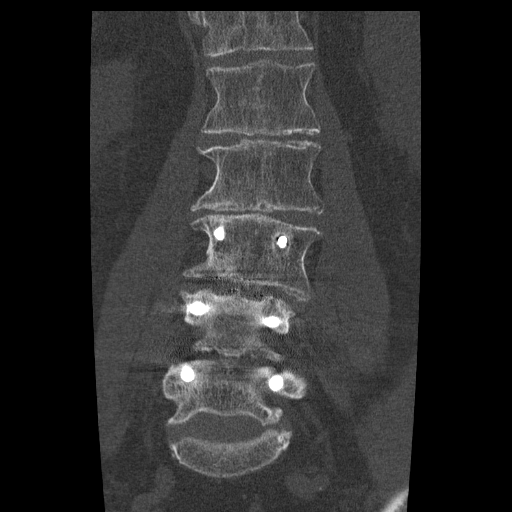

[Series 401: sagittal · sagittal · 0.46mm/px · 5 of 63 slices shown, 6 images]
[im 21/63  bone]
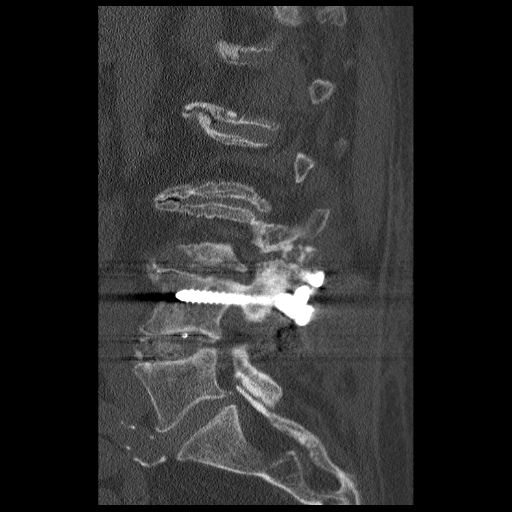
[im 26/63  bone]
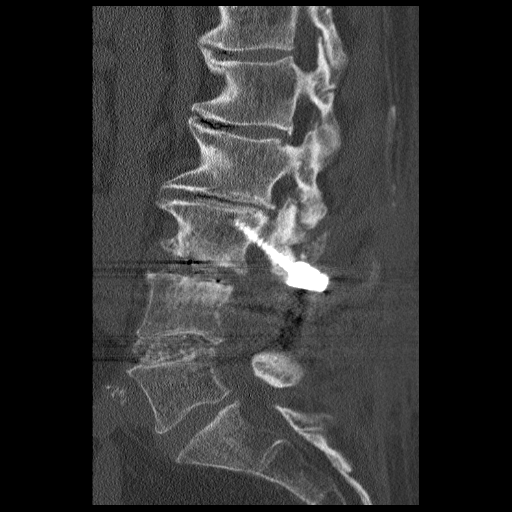
[im 32/63  soft-tissue]
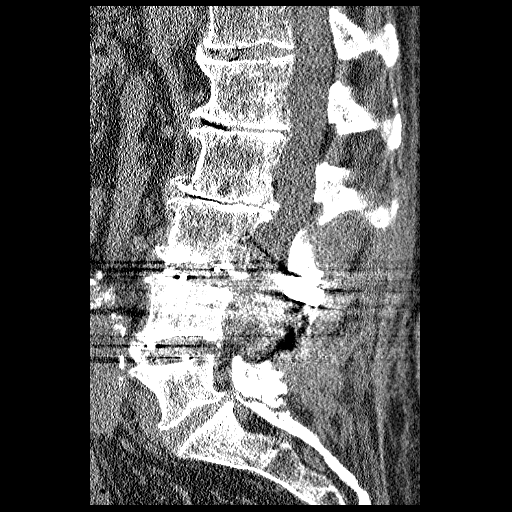
[im 32/63  bone]
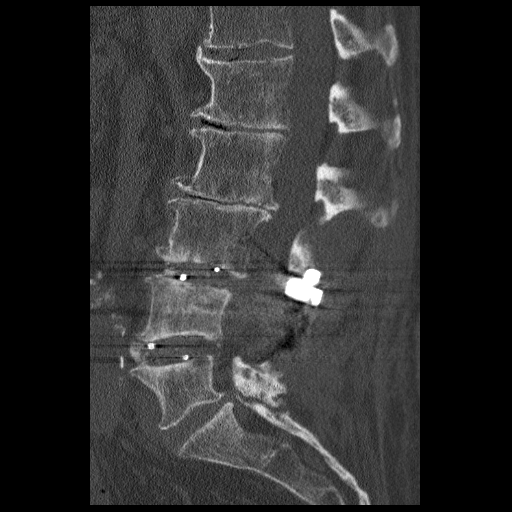
[im 37/63  bone]
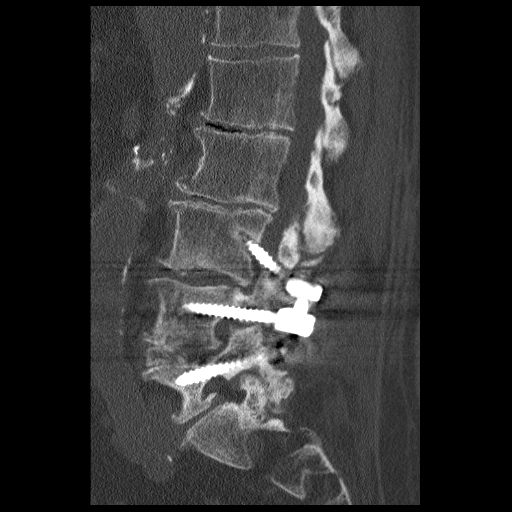
[im 42/63  bone]
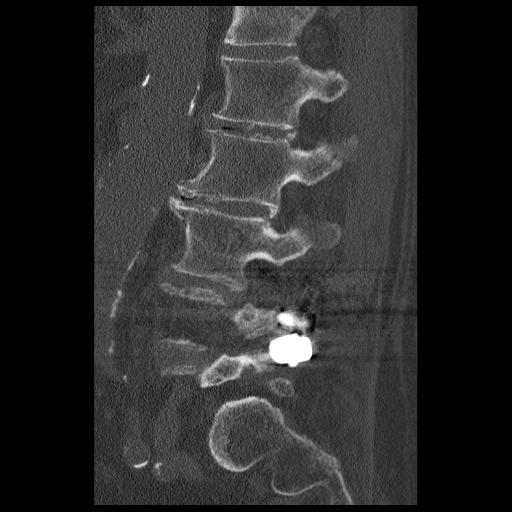

[12 of 33 positions shown; findings below may reference images not displayed]

FINDINGS: Since the prior MRI, the patient has undergone L3-S1
fusion with pedicle screws, stabilization bars and interbody
spacers in place.  The left L3 screw does not localize the pedicle
passing through the left lateral recess and medial aspect of the
left L3-4 foramen.  There is lucency about the screw consistent
with loosening.  The right pedicle screw in L3 is inferiorly
positioned but does go through a portion of the pedicle.  The screw
also traverses the periphery of the right lateral recess and
superior most aspect of the right L3-4 foramen.  There is no
lucency about the screw.  Pedicle screws are otherwise
unremarkable.  Stabilization bars are in place.  There is some
bridging bone across the L4-5 interspace.  No bridging bone is seen
across the L3-4 interspace.

There is no fracture of the lumbar spine.  Convex left scoliosis is
noted.  Imaged intra-abdominal contents demonstrate mild dilatation
of the descending abdominal aorta at 3.4 cm.

T12-L1: There is a mild disc bulge but the central canal and
foramina appear open.

L1-2:  There is some facet degenerative disease and a minimal disc
bulge.  Central canal and foramina appear open.

L2-3:  Mild disc bulge and endplate spur appear unchanged.  Disc is
eccentric to the right causing some narrowing the right lateral
recess.  Mild right foraminal narrowing is also identified.
Central canal and left foramen appear open.

L3-4: Wide laminectomy defect is identified.  There is some
endplate spurring but the central canal appears open.  Mild right
foraminal narrowing noted.

L4-5:  Wide laminectomy defect is identified.  Central canal and
foramina are open.

L5-S1:  Left worse than right facet degenerative disease.  There is
mild disc bulging and endplate spurring.  Mild central canal
narrowing is identified.  There is also mild bilateral lateral
recess and foraminal narrowing.  The appearance is unchanged.
IMPRESSION: 1. Interval L3-5 fusion.  As described above, the left L3 screw
does not localize the pedicle passing through the left lateral
recess and medial most left L3-4 foramen. Lucency about screw is
consistent with loosening.  The right L3 screw partially localizes
the pedicle and also passes through the superior most aspect of the
right L3-4 foramen and the periphery of the right L3-4 lateral
recess.  No bridging bone is seen across the L3-4 interspace.
There is some early fusion across L4-5.  The patient's examination
is otherwise unchanged.
2.  Small abdominal aortic aneurysm measuring 3.4 cm.

## 2014-05-06 NOTE — Telephone Encounter (Signed)
Closed encounter °

## 2014-05-07 IMAGING — CR DG CHEST 2V
2 series · 2 of 2 positions shown · non-contrast
Comparison: Two-view chest 08/08/2011.  CTA chest 08/08/2011.

CLINICAL DATA: Chest pain and shortness of breath.

CHEST - 2 VIEW

[w chest pa]
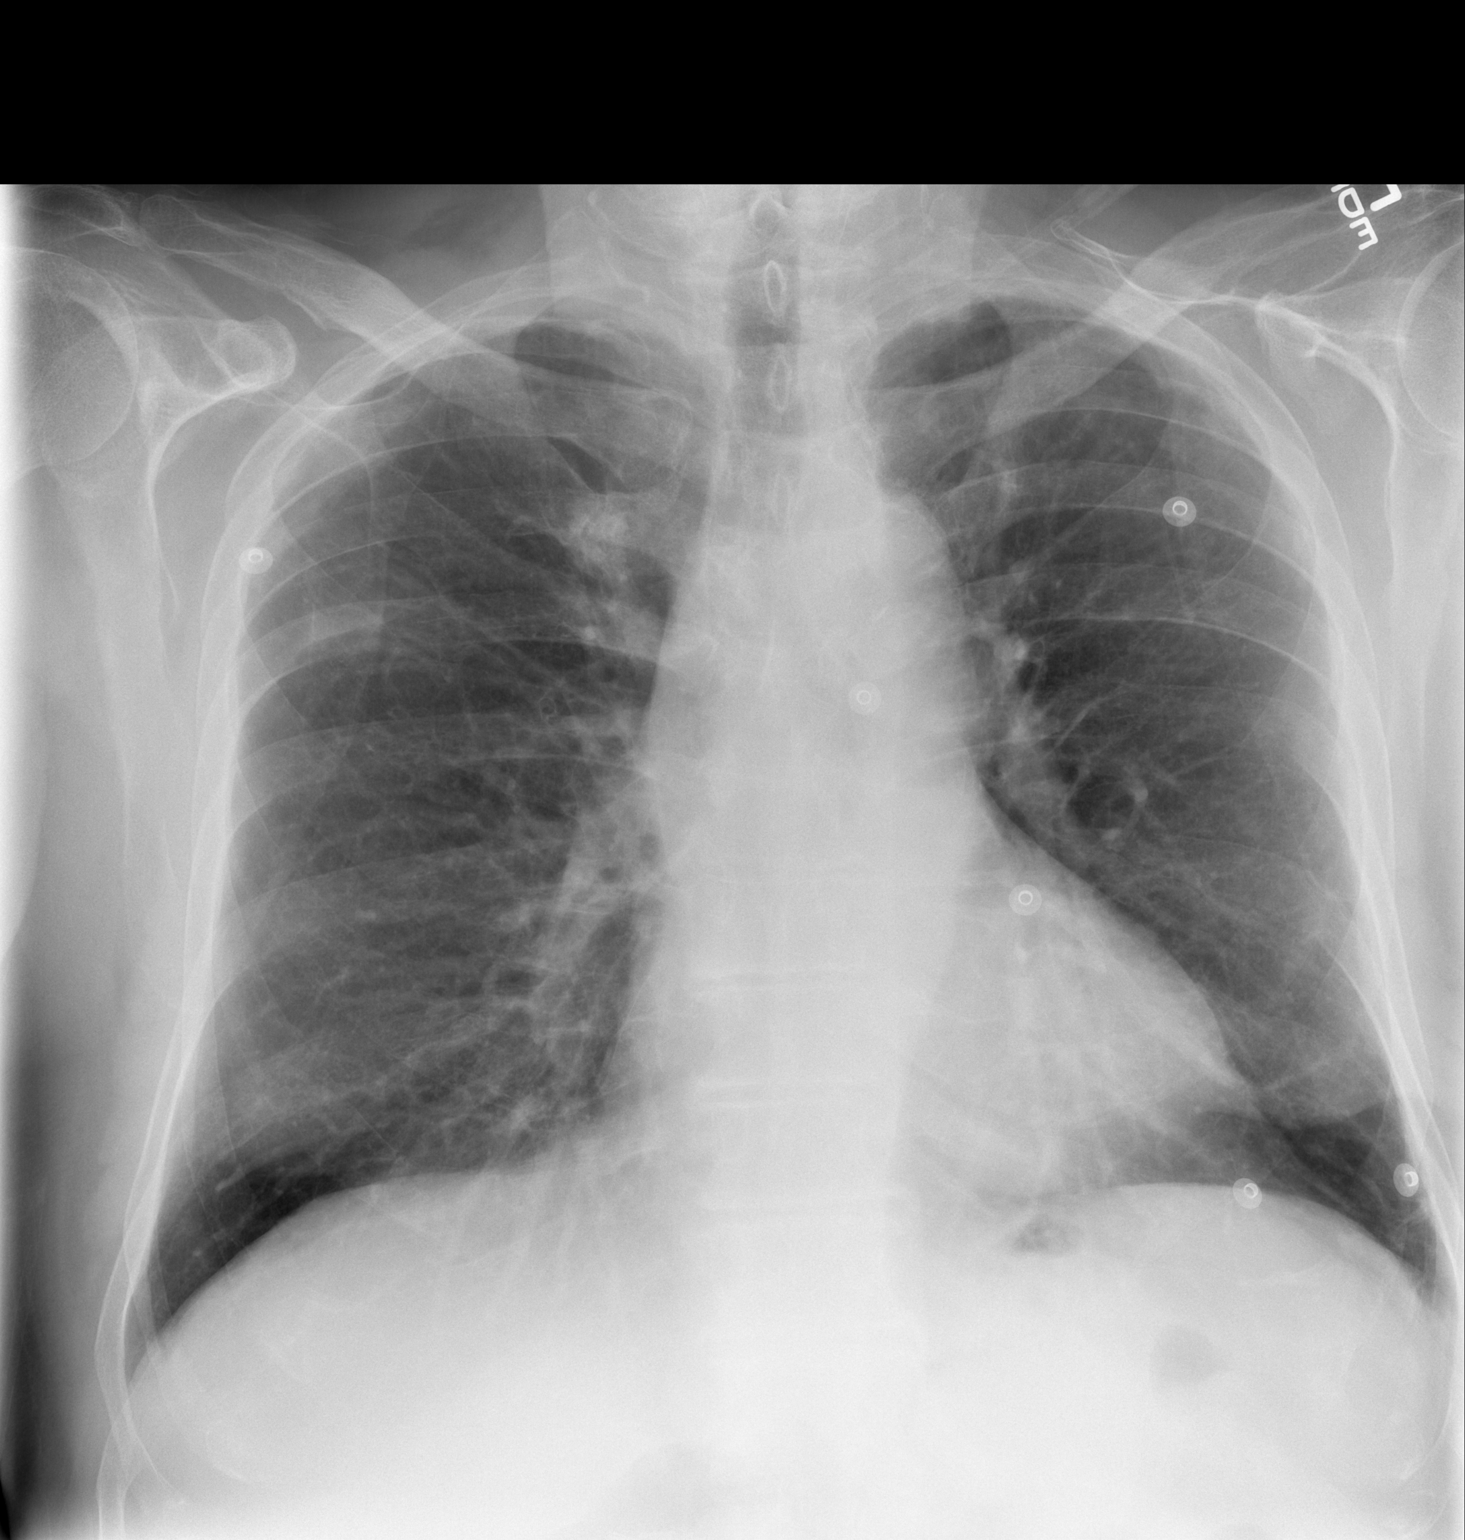

[w chest lat]
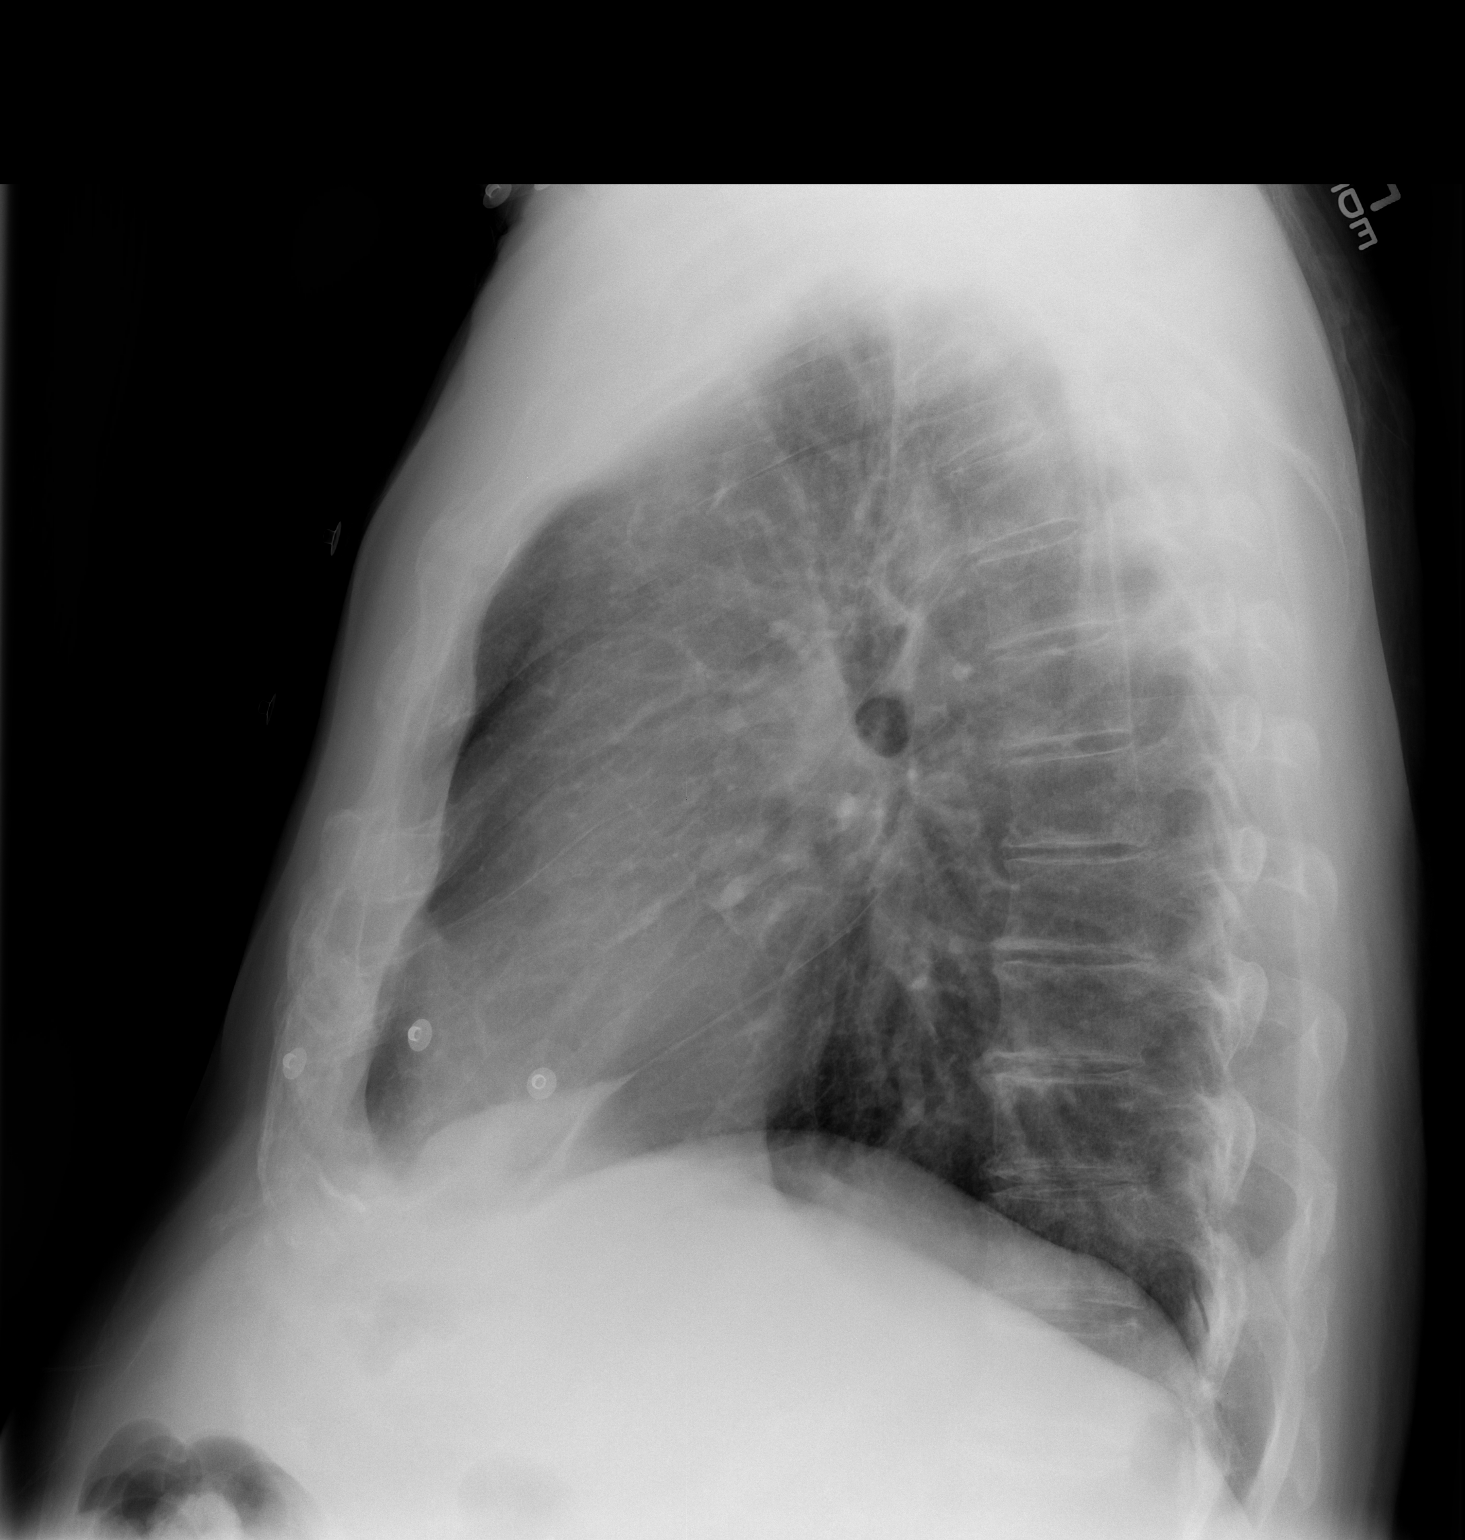

[2 of 2 positions shown; findings below may reference images not displayed]

FINDINGS: The heart size is normal.  Pleural densities in the right
upper lobe are stable.  Mild emphysematous changes are evident.  No
focal airspace disease is present.  Degenerative changes of the
thoracic spine are stable.
IMPRESSION: 1.  No acute cardiopulmonary disease or significant interval
change.
2.  Stable mild emphysematous changes.
3.  Stable pleural based disease of the right upper lobe.

## 2014-05-17 ENCOUNTER — Telehealth: Payer: Self-pay

## 2014-05-17 ENCOUNTER — Ambulatory Visit: Payer: Medicare Other | Admitting: Interventional Cardiology

## 2014-05-17 NOTE — Telephone Encounter (Signed)
Patient called for samples of xarelto placed samples up front

## 2014-05-20 IMAGING — RF DG C-ARM 61-120 MIN
1 series · 2 of 2 positions shown · non-contrast
Comparison: Lumbar spine CT - 09/22/2012

CLINICAL DATA: L3 - L5 revision

DG C-ARM 1-60 MIN,LUMBAR SPINE - 2-3 VIEW

[Series 1: run · 2 of 2 slices shown]
[im 1/2]
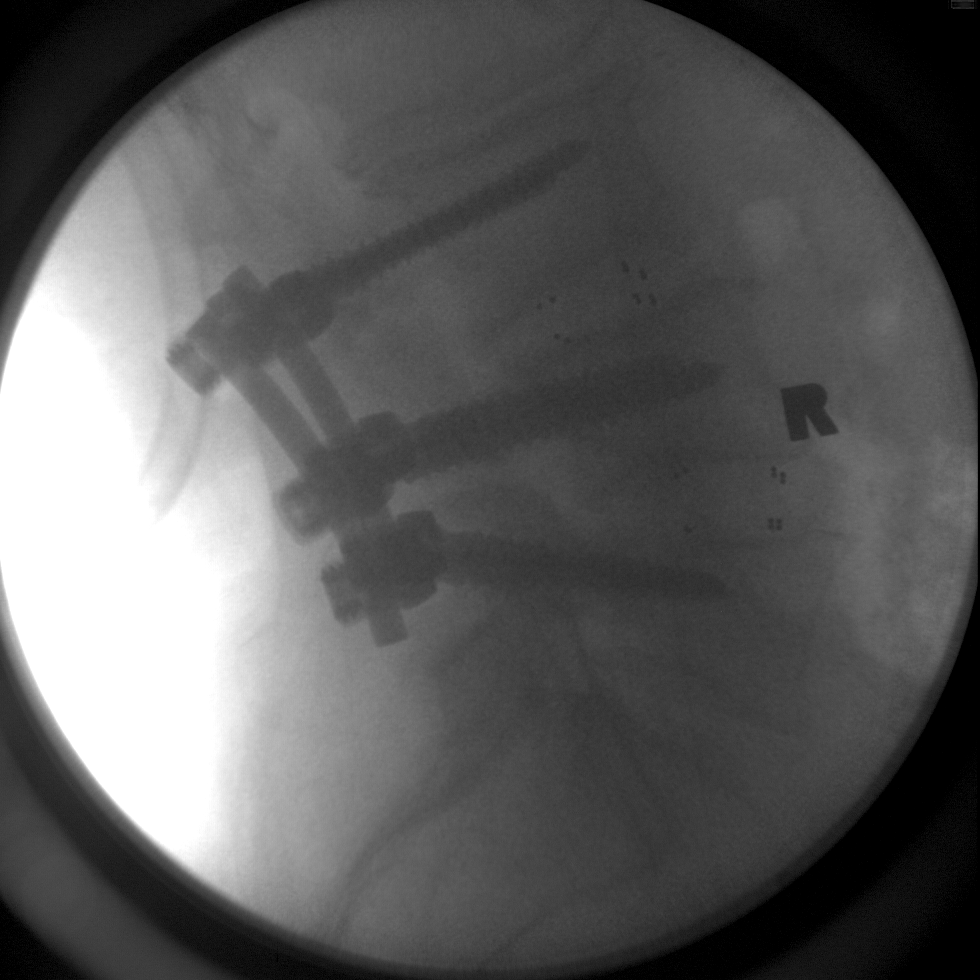
[im 2/2]
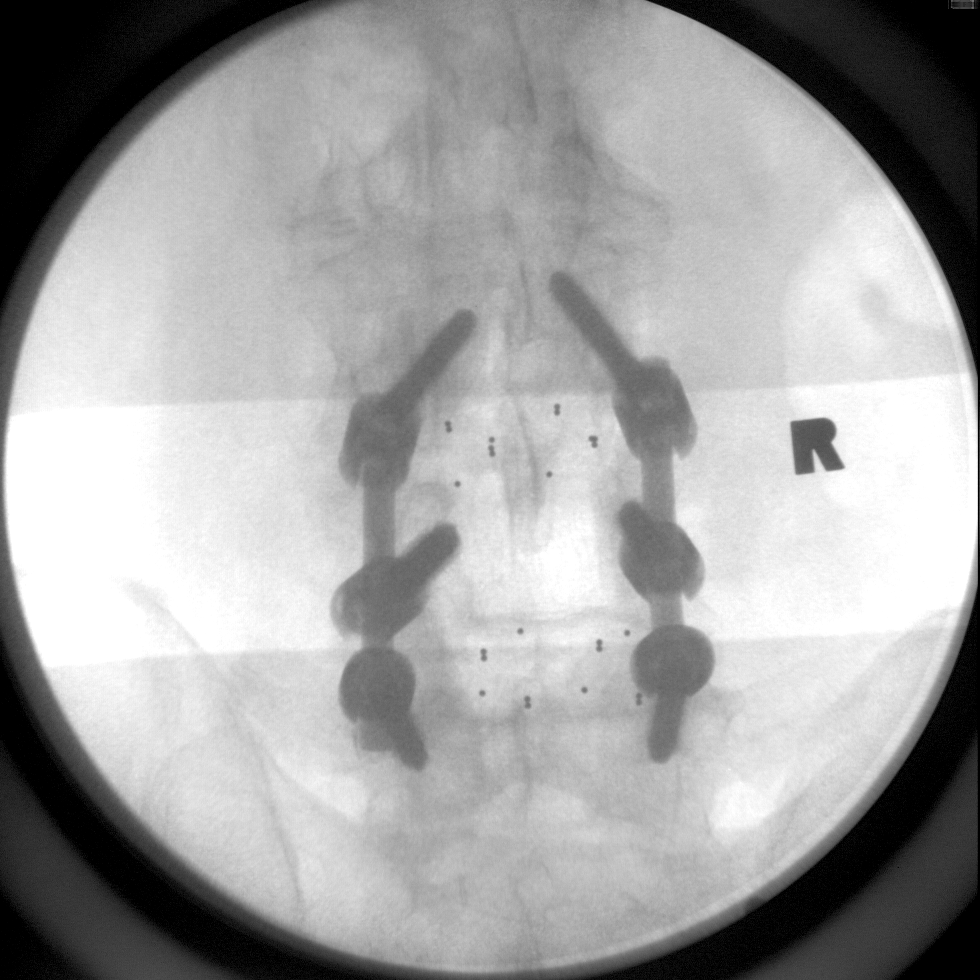

[2 of 2 positions shown; findings below may reference images not displayed]

FINDINGS: Two spot intraoperative fluoroscopic images of the lumbar spine are
provided for review.

Lumbar spine labeling is in keeping with preprocedural lumbar spine
CT.

Images demonstrate L3 - L5 paraspinal fusion and intervertebral
disc space replacement.  There is improved alignment of the
bilateral L3 pedicular screws.  Laminectomy defect is seen at L4.
No radiopaque foreign body.
IMPRESSION: L3 - L5 paraspinal fusion/revision with improved alignment of the
bilateral L3 pedicular screws.

## 2014-06-09 ENCOUNTER — Other Ambulatory Visit: Payer: Self-pay | Admitting: Internal Medicine

## 2014-06-09 DIAGNOSIS — R141 Gas pain: Secondary | ICD-10-CM | POA: Insufficient documentation

## 2014-06-09 DIAGNOSIS — R110 Nausea: Secondary | ICD-10-CM

## 2014-06-09 DIAGNOSIS — R109 Unspecified abdominal pain: Secondary | ICD-10-CM

## 2014-06-09 DIAGNOSIS — R140 Abdominal distension (gaseous): Secondary | ICD-10-CM

## 2014-06-10 ENCOUNTER — Ambulatory Visit
Admission: RE | Admit: 2014-06-10 | Discharge: 2014-06-10 | Disposition: A | Discharge status: Home / Self Care | Payer: Medicare Other | Source: Ambulatory Visit | Attending: Internal Medicine | Admitting: Internal Medicine

## 2014-06-10 DIAGNOSIS — R140 Abdominal distension (gaseous): Secondary | ICD-10-CM

## 2014-06-10 DIAGNOSIS — R109 Unspecified abdominal pain: Secondary | ICD-10-CM

## 2014-06-10 DIAGNOSIS — R110 Nausea: Secondary | ICD-10-CM

## 2014-06-10 MED ORDER — IOHEXOL 300 MG/ML  SOLN
125.0000 mL | Freq: Once | INTRAMUSCULAR | Status: AC | PRN
  Administered 2014-06-10: 125 mL via INTRAVENOUS

## 2014-06-16 ENCOUNTER — Other Ambulatory Visit: Payer: Self-pay | Admitting: Internal Medicine

## 2014-06-16 DIAGNOSIS — N289 Disorder of kidney and ureter, unspecified: Secondary | ICD-10-CM

## 2014-06-23 ENCOUNTER — Ambulatory Visit
Admission: RE | Admit: 2014-06-23 | Discharge: 2014-06-23 | Disposition: A | Discharge status: Home / Self Care | Payer: Medicare Other | Source: Ambulatory Visit | Attending: Internal Medicine | Admitting: Internal Medicine

## 2014-06-23 DIAGNOSIS — N289 Disorder of kidney and ureter, unspecified: Secondary | ICD-10-CM

## 2014-06-23 MED ORDER — GADOBENATE DIMEGLUMINE 529 MG/ML IV SOLN
19.0000 mL | Freq: Once | INTRAVENOUS | Status: AC | PRN
  Administered 2014-06-23: 19 mL via INTRAVENOUS

## 2014-09-02 ENCOUNTER — Other Ambulatory Visit: Payer: Self-pay | Admitting: Interventional Cardiology

## 2014-09-02 MED ORDER — METOPROLOL TARTRATE 50 MG PO TABS: ORAL_TABLET | ORAL | Status: DC

## 2014-09-07 ENCOUNTER — Telehealth: Payer: Self-pay

## 2014-09-07 NOTE — Telephone Encounter (Signed)
Patient called to get samples of xarelto placed a month supply of xarelto up front

## 2014-10-03 ENCOUNTER — Ambulatory Visit: Payer: Medicare Other | Admitting: Interventional Cardiology

## 2014-11-07 ENCOUNTER — Ambulatory Visit: Payer: Medicare Other | Admitting: Interventional Cardiology

## 2014-11-15 ENCOUNTER — Ambulatory Visit (INDEPENDENT_AMBULATORY_CARE_PROVIDER_SITE_OTHER): Payer: Medicare Other | Admitting: Pharmacist

## 2014-11-15 VITALS — Wt 208.0 lb

## 2014-11-15 DIAGNOSIS — I480 Paroxysmal atrial fibrillation: Secondary | ICD-10-CM

## 2014-11-15 NOTE — Progress Notes (Signed)
Pt was started on Xarelto for Afib on 08/13/2013.    Reviewed patients medication list.  Pt is not currently on any combined P-gp and strong CYP3A4 inhibitors/inducers (ketoconazole, traconazole, ritonavir, carbamazepine, phenytoin, rifampin, St. John's wort).  Reviewed labs.  SCr 0.77, Weight 94 kg, CrCl- >100 mL/min.  Dose appropriate based on CrCl.   Hgb and HCT Within Normal Limits  A full discussion of the nature of anticoagulants has been carried out.  A benefit/risk analysis has been presented to the patient, so that they understand the justification for choosing anticoagulation with Xarelto at this time.  The need for compliance is stressed.  Pt is aware to take the medication once daily with the largest meal of the day.  Side effects of potential bleeding are discussed, including unusual colored urine or stools, coughing up blood or coffee ground emesis, nose bleeds or serious fall or head trauma.  Discussed signs and symptoms of stroke. The patient should avoid any OTC items containing aspirin or ibuprofen.  Avoid alcohol consumption.   Call if any signs of abnormal bleeding.  Discussed financial obligations and resolved any difficulty in obtaining medication.  Next lab test in 6 months.  Drucie Opitz, PharmD Clinical Pharmacy Resident

## 2014-11-16 LAB — BASIC METABOLIC PANEL
BUN: 13 mg/dL (ref 6–23)
CO2: 31 mEq/L (ref 19–32)
Calcium: 10.1 mg/dL (ref 8.4–10.5)
Chloride: 100 mEq/L (ref 96–112)
Creatinine, Ser: 0.77 mg/dL (ref 0.40–1.50)
GFR: 104.48 mL/min (ref >60.00)
Glucose, Bld: 172 mg/dL — ABNORMAL HIGH (ref 70–99)
Potassium: 4.3 mEq/L (ref 3.5–5.1)
Sodium: 136 mEq/L (ref 135–145)

## 2014-11-16 LAB — CBC
HCT: 39.9 % (ref 39.0–52.0)
Hemoglobin: 14 g/dL (ref 13.0–17.0)
MCHC: 35.1 g/dL (ref 30.0–36.0)
MCV: 89.7 fl (ref 78.0–100.0)
Platelets: 205 10*3/uL (ref 150.0–400.0)
RBC: 4.45 Mil/uL (ref 4.22–5.81)
RDW: 13.2 % (ref 11.5–15.5)
WBC: 4.1 10*3/uL (ref 4.0–10.5)

## 2014-11-16 NOTE — Progress Notes (Signed)
Quick Note:  Please report to patient. The recent labs are stable. Continue same medication and careful diet. ______ 

## 2014-11-22 ENCOUNTER — Other Ambulatory Visit: Payer: Self-pay

## 2014-11-22 MED ORDER — RIVAROXABAN 20 MG PO TABS: 20.0000 mg | ORAL_TABLET | Freq: Every day | ORAL | Status: DC

## 2014-12-11 ENCOUNTER — Inpatient Hospital Stay (HOSPITAL_COMMUNITY)
Admission: EM | Admit: 2014-12-11 | Discharge: 2014-12-14 | DRG: 872 | Disposition: A | Discharge status: Home / Self Care | Payer: Medicare Other | Attending: Internal Medicine | Admitting: Internal Medicine

## 2014-12-11 ENCOUNTER — Encounter (HOSPITAL_COMMUNITY): Payer: Self-pay | Admitting: *Deleted

## 2014-12-11 ENCOUNTER — Emergency Department (HOSPITAL_COMMUNITY): Payer: Medicare Other

## 2014-12-11 DIAGNOSIS — Z87891 Personal history of nicotine dependence: Secondary | ICD-10-CM

## 2014-12-11 DIAGNOSIS — I252 Old myocardial infarction: Secondary | ICD-10-CM

## 2014-12-11 DIAGNOSIS — E785 Hyperlipidemia, unspecified: Secondary | ICD-10-CM | POA: Diagnosis present

## 2014-12-11 DIAGNOSIS — I959 Hypotension, unspecified: Secondary | ICD-10-CM

## 2014-12-11 DIAGNOSIS — I714 Abdominal aortic aneurysm, without rupture: Secondary | ICD-10-CM | POA: Diagnosis present

## 2014-12-11 DIAGNOSIS — Z888 Allergy status to other drugs, medicaments and biological substances status: Secondary | ICD-10-CM

## 2014-12-11 DIAGNOSIS — G8929 Other chronic pain: Secondary | ICD-10-CM | POA: Diagnosis present

## 2014-12-11 DIAGNOSIS — Z9049 Acquired absence of other specified parts of digestive tract: Secondary | ICD-10-CM | POA: Diagnosis present

## 2014-12-11 DIAGNOSIS — G4733 Obstructive sleep apnea (adult) (pediatric): Secondary | ICD-10-CM | POA: Diagnosis present

## 2014-12-11 DIAGNOSIS — R197 Diarrhea, unspecified: Secondary | ICD-10-CM | POA: Diagnosis present

## 2014-12-11 DIAGNOSIS — A419 Sepsis, unspecified organism: Principal | ICD-10-CM | POA: Diagnosis present

## 2014-12-11 DIAGNOSIS — I2510 Atherosclerotic heart disease of native coronary artery without angina pectoris: Secondary | ICD-10-CM | POA: Diagnosis present

## 2014-12-11 DIAGNOSIS — R112 Nausea with vomiting, unspecified: Secondary | ICD-10-CM | POA: Diagnosis present

## 2014-12-11 DIAGNOSIS — M1990 Unspecified osteoarthritis, unspecified site: Secondary | ICD-10-CM | POA: Diagnosis present

## 2014-12-11 DIAGNOSIS — K219 Gastro-esophageal reflux disease without esophagitis: Secondary | ICD-10-CM | POA: Diagnosis present

## 2014-12-11 DIAGNOSIS — E780 Pure hypercholesterolemia: Secondary | ICD-10-CM | POA: Diagnosis present

## 2014-12-11 DIAGNOSIS — K862 Cyst of pancreas: Secondary | ICD-10-CM | POA: Diagnosis present

## 2014-12-11 DIAGNOSIS — K529 Noninfective gastroenteritis and colitis, unspecified: Secondary | ICD-10-CM | POA: Diagnosis present

## 2014-12-11 DIAGNOSIS — I482 Chronic atrial fibrillation: Secondary | ICD-10-CM | POA: Diagnosis present

## 2014-12-11 DIAGNOSIS — R109 Unspecified abdominal pain: Secondary | ICD-10-CM | POA: Diagnosis present

## 2014-12-11 DIAGNOSIS — R6510 Systemic inflammatory response syndrome (SIRS) of non-infectious origin without acute organ dysfunction: Secondary | ICD-10-CM | POA: Diagnosis present

## 2014-12-11 DIAGNOSIS — Z955 Presence of coronary angioplasty implant and graft: Secondary | ICD-10-CM

## 2014-12-11 DIAGNOSIS — E119 Type 2 diabetes mellitus without complications: Secondary | ICD-10-CM

## 2014-12-11 DIAGNOSIS — G629 Polyneuropathy, unspecified: Secondary | ICD-10-CM | POA: Diagnosis present

## 2014-12-11 DIAGNOSIS — M545 Low back pain: Secondary | ICD-10-CM | POA: Diagnosis present

## 2014-12-11 DIAGNOSIS — I10 Essential (primary) hypertension: Secondary | ICD-10-CM | POA: Diagnosis present

## 2014-12-11 LAB — CBC WITH DIFFERENTIAL/PLATELET
Basophils Absolute: 0 10*3/uL (ref 0.0–0.1)
Basophils Relative: 0 % (ref 0–1)
Eosinophils Absolute: 0 10*3/uL (ref 0.0–0.7)
Eosinophils Relative: 0 % (ref 0–5)
HCT: 46.2 % (ref 39.0–52.0)
Hemoglobin: 16.1 g/dL (ref 13.0–17.0)
Lymphocytes Relative: 11 % — ABNORMAL LOW (ref 12–46)
Lymphs Abs: 1.6 10*3/uL (ref 0.7–4.0)
MCH: 31.6 pg (ref 26.0–34.0)
MCHC: 34.8 g/dL (ref 30.0–36.0)
MCV: 90.6 fL (ref 78.0–100.0)
Monocytes Absolute: 1.8 10*3/uL — ABNORMAL HIGH (ref 0.1–1.0)
Monocytes Relative: 12 % (ref 3–12)
Neutro Abs: 11.5 10*3/uL — ABNORMAL HIGH (ref 1.7–7.7)
Neutrophils Relative %: 77 % (ref 43–77)
Platelets: 150 10*3/uL (ref 150–400)
RBC: 5.1 MIL/uL (ref 4.22–5.81)
RDW: 13.2 % (ref 11.5–15.5)
WBC: 15 10*3/uL — ABNORMAL HIGH (ref 4.0–10.5)

## 2014-12-11 LAB — COMPREHENSIVE METABOLIC PANEL
ALT: 28 U/L (ref 0–53)
AST: 26 U/L (ref 0–37)
Albumin: 4.5 g/dL (ref 3.5–5.2)
Alkaline Phosphatase: 59 U/L (ref 39–117)
Anion gap: 9 (ref 5–15)
BUN: 14 mg/dL (ref 6–23)
CO2: 26 mmol/L (ref 19–32)
Calcium: 10.3 mg/dL (ref 8.4–10.5)
Chloride: 103 mmol/L (ref 96–112)
Creatinine, Ser: 1.07 mg/dL (ref 0.50–1.35)
GFR calc Af Amer: 76 mL/min — ABNORMAL LOW (ref >90)
GFR calc non Af Amer: 66 mL/min — ABNORMAL LOW (ref >90)
Glucose, Bld: 203 mg/dL — ABNORMAL HIGH (ref 70–99)
Potassium: 4.1 mmol/L (ref 3.5–5.1)
Sodium: 138 mmol/L (ref 135–145)
Total Bilirubin: 0.9 mg/dL (ref 0.3–1.2)
Total Protein: 7.7 g/dL (ref 6.0–8.3)

## 2014-12-11 LAB — PROTIME-INR
INR: 1.06 (ref 0.00–1.49)
Prothrombin Time: 13.9 seconds (ref 11.6–15.2)

## 2014-12-11 LAB — TROPONIN I: Troponin I: <0.03 ng/mL (ref <0.031)

## 2014-12-11 LAB — I-STAT CG4 LACTIC ACID, ED: Lactic Acid, Venous: 3.74 mmol/L (ref 0.5–2.0)

## 2014-12-11 LAB — LIPASE, BLOOD: Lipase: 35 U/L (ref 11–59)

## 2014-12-11 MED ORDER — SODIUM CHLORIDE 0.9 % IV BOLUS (SEPSIS)
1000.0000 mL | Freq: Once | INTRAVENOUS | Status: AC
  Administered 2014-12-11: 1000 mL via INTRAVENOUS

## 2014-12-11 MED ORDER — ONDANSETRON HCL 4 MG/2ML IJ SOLN
4.0000 mg | Freq: Once | INTRAMUSCULAR | Status: AC
  Administered 2014-12-11: 4 mg via INTRAVENOUS
  Filled 2014-12-11: qty 2

## 2014-12-11 NOTE — ED Provider Notes (Signed)
CSN: [id number]     Arrival date & time 12/11/14  2200 History   First MD Initiated Contact with Patient 12/11/14 2301     Chief Complaint  Patient presents with  . Nausea  . Emesis  . Diarrhea     Patient is a 74 y.o. male presenting with vomiting and diarrhea. The history is provided by the patient. No language interpreter was used.  Emesis Associated symptoms: diarrhea   Diarrhea Associated symptoms: vomiting    Mr. Kazmierski presents for evaluation of nausea, vomiting, diarrhea. This morning he feelt poorly with nausea and diarrhea. His symptoms improved through the middle of the day and when he woke up this evening he had severe nausea, vomiting, diarrhea. He reports generalized malaise with hot and cold chills. He denies any abdominal pain but feels like he's been punched in the stomach. He's been near syncopal at times. His wife checked the blood pressure at home and it was 65 systolic. He has a history of high blood pressure, diabetes, atrial fibrillation on Xarelto. Symptoms are severe, constant, worsening. He denies any sick contacts.  Past Medical History  Diagnosis Date  . Hypercholesteremia     takes lipitor daily  . AAA (abdominal aortic aneurysm)   . Pain     back pain chronic- seen at pain clinic  . Neuropathy     in legs  . Bronchitis     hx of  . Ileus   . Arthritis     BacK  . Incisional hernia     "small, from AAA"  . Coronary atherosclerosis of native coronary artery     ejection fraction of 40%, prior inferior myocardial infarction in February 1993, PTCA, LAD rotational atherectomy in August of 1998, acute inferolateral myocardial infarction, ventricular fibrillation in July of 1999, PTCA and stent of obtuse marginal 1, 100% RCA 1/12 with collaterals from patent circumflex and LAD  . Hypertension     takes Ramipril daily  . MI (myocardial infarction)     1992 and 1997 sees Dr. Linard Millers as needed   . Dysrhythmia     "got out of rhythym a couple , but it  straightened back out."  . Sleep apnea     uses CPAP--sleep study done at least 51yrs ago  . Chronic back pain   . Dry skin   . Acid reflux     takes Prilosec and Protonix daily  . Diabetes mellitus     takes Metformin daily  . Complication of anesthesia     -years ago hair fell out.;ileus after 2 of his surgeries   Past Surgical History  Procedure Laterality Date  . Abdominal surgery    . Abdominal aortic aneurysm repair    . Colonoscopy    . Cholecystectomy    . Back surgery  2013    Miminal Invasive x2 in [place].  . Cardiac catheterization  2009/2012  . Coronary angioplasty      2 stents  . Coronary stent placement     Family History  Problem Relation Age of Onset  . Diabetes Mother   . Coronary artery disease Mother   . Diabetes Father   . Arthritis Father   . Rheum arthritis Father   . Coronary artery disease Father    History  Substance Use Topics  . Smoking status: Former Smoker    Quit date: 01/01/1992  . Smokeless tobacco: Never Used  . Alcohol Use: Yes     Comment: rarely  Review of Systems  Gastrointestinal: Positive for vomiting and diarrhea.  All other systems reviewed and are negative.     Allergies  Cymbalta; Gabapentin; Lyrica; and Spinach  Home Medications   Prior to Admission medications   Medication Sig Start Date End Date Taking? Authorizing Provider  ACCU-CHEK FASTCLIX LANCETS [place]  07/13/13   Historical Provider, MD  [place] test strip  07/30/13   Historical Provider, MD  atorvastatin (LIPITOR) 10 MG tablet Take 10 mg by mouth daily.    Historical Provider, MD  Coenzyme Q10 (CO Q 10) 100 MG CAPS Take 100 mg by mouth daily.    Historical Provider, MD  diazepam (VALIUM) 5 MG tablet Take 5 mg by mouth every 6 (six) hours as needed for muscle spasms.  06/25/13   Historical Provider, MD  finasteride (PROPECIA) 1 MG tablet Take 1 mg by mouth daily.     Historical Provider, MD  glimepiride (AMARYL) 1 MG tablet  07/20/13    Historical Provider, MD  metFORMIN (GLUCOPHAGE) 1000 MG tablet Take 1,000 mg by mouth 2 (two) times daily with a meal.    Historical Provider, MD  metoprolol (LOPRESSOR) 50 MG tablet TAKE ONE TABLET BY MOUTH TWICE DAILY 09/02/14   Belva Crome III, MD  Multiple Vitamin (MULTIVITAMIN WITH MINERALS) TABS Take 1 tablet by mouth daily.    Historical Provider, MD  omeprazole (PRILOSEC) 20 MG capsule Take 20 mg by mouth daily. 09/14/12   Historical Provider, MD  ramipril (ALTACE) 10 MG capsule Take 1 capsule by mouth daily. 11/05/13   Historical Provider, MD  rivaroxaban (XARELTO) 20 MG TABS tablet Take 1 tablet (20 mg total) by mouth daily with supper. 11/22/14   Belva Crome III, MD  vitamin B-12 (CYANOCOBALAMIN) 1000 MCG tablet Take 1,000 mcg by mouth daily.     Historical Provider, MD   BP 88/58 mmHg  Pulse 148  Temp(Src) 97.5 F (36.4 C)  Resp 18  Ht 6' (1.829 m)  Wt 210 lb (95.255 kg)  BMI 28.47 kg/m2  SpO2 91% Physical Exam  Constitutional: He is oriented to person, place, and time. He appears well-developed and well-nourished.  Uncomfortable appearing  HENT:  Head: Normocephalic and atraumatic.  Cardiovascular:  No murmur heard. Tachycardic  Pulmonary/Chest: Effort normal and breath sounds normal. No respiratory distress.  Abdominal: Soft. There is no rebound and no guarding.  Mild diffuse tenderness  Musculoskeletal: He exhibits no edema or tenderness.  Neurological: He is alert and oriented to person, place, and time.  Skin: Skin is warm and dry.  Psychiatric: He has a normal mood and affect. His behavior is normal.  Nursing note and vitals reviewed.   ED Course  Procedures (including critical care time) Labs Review Labs Reviewed  COMPREHENSIVE METABOLIC PANEL - Abnormal; Notable for the following:    Glucose, Bld 203 (*)    GFR calc non Af Amer 66 (*)    GFR calc Af Amer 76 (*)    All other components within normal limits  CBC WITH DIFFERENTIAL/PLATELET - Abnormal;  Notable for the following:    WBC 15.0 (*)    Neutro Abs 11.5 (*)    Lymphocytes Relative 11 (*)    Monocytes Absolute 1.8 (*)    All other components within normal limits  URINALYSIS, ROUTINE W REFLEX MICROSCOPIC - Abnormal; Notable for the following:    APPearance CLOUDY (*)    Glucose, UA 100 (*)    Hgb urine dipstick SMALL (*)  Protein, ur 100 (*)    All other components within normal limits  URINE MICROSCOPIC-ADD ON - Abnormal; Notable for the following:    Bacteria, UA FEW (*)    Casts HYALINE CASTS (*)    All other components within normal limits  I-STAT CG4 LACTIC ACID, ED - Abnormal; Notable for the following:    Lactic Acid, Venous 3.74 (*)    All other components within normal limits  I-STAT CG4 LACTIC ACID, ED - Abnormal; Notable for the following:    Lactic Acid, Venous 2.61 (*)    All other components within normal limits  CLOSTRIDIUM DIFFICILE BY PCR  PROTIME-INR  TROPONIN I  LIPASE, BLOOD    Imaging Review Ct Abdomen Pelvis W Contrast  12/12/2014   CLINICAL DATA:  Nausea, vomiting and diarrhea beginning yesterday at noon, weakness, pre syncopal. Hypotensive. Tachycardia. History of abdominal aortic aneurysm, acid reflux, incisional hernia.  EXAM: CT ABDOMEN AND PELVIS WITH CONTRAST  TECHNIQUE: Multidetector CT imaging of the abdomen and pelvis was performed using the standard protocol following bolus administration of intravenous contrast.  CONTRAST:  100 cc Omnipaque 300  COMPARISON:  MRI of the abdomen June 23, 2014 and CT of the abdomen and pelvis June 10, 2014  FINDINGS: LUNG BASES: Stable 9 mm pleural-based LEFT lower lobe nodule. Heart is mildly enlarged, trace pericardial effusion.  SOLID ORGANS: The liver, spleen, pancreas and adrenal glands are unremarkable. Status post cholecystectomy. 9 mm hypodensity and tail of the pancreas go, as reported on prior MRI. Adrenal glands are unremarkable.  GASTROINTESTINAL TRACT: The stomach, small and large bowel are  normal in course and caliber without inflammatory changes. Fluid filled small large bowel. Normal appendix.  KIDNEYS/ URINARY TRACT: Kidneys are orthotopic, demonstrating symmetric enhancement. No nephrolithiasis, hydronephrosis or solid renal masses. Focal scarring LEFT lower pole coarse calcification, unchanged. 2.9 cm RIGHT interpolar cyst, previously characterized on MR. 3 mm LEFT lower pole, punctate LEFT interpolar nephrolithiasis. Too small to characterize hypodensities in kidneys bilaterally. The unopacified ureters are normal in course and caliber. Delayed imaging through the kidneys demonstrates symmetric prompt contrast excretion within the proximal urinary collecting system. Urinary bladder is partially distended and unremarkable.  PERITONEUM/RETROPERITONEUM: 3.1 x 3.7 cm infrarenal aortic aneurysm, similar to prior examination, status post apparent aortoiliac surgical intervention. 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 No lymphadenopathy by CT size criteria. Prostate is upper limits of normal in size. No intraperitoneal free fluid nor free air.  SOFT TISSUE/OSSEOUS STRUCTURES: Non-suspicious. Small fat containing LEFT inguinal hernia. Status post L3-4 and L4-5 PLIF. Stable degenerative change of lumbar spine.  IMPRESSION: Fluid filled nondistended small and large bowel may reflect enteritis. No bowel obstruction.  Re demonstration of subcentimeter pancreatic tail cystic mass, for prior recommendation, follow-up again recommended.  Nonobstructing LEFT nephrolithiasis measure up to 3 mm.  Stable 3.1 x 3.7 cm infrarenal aortic aneurysm, status post apparent aortoiliac surgical repair appearing Recommend followup by ultrasound in 2 years. This recommendation follows ACR consensus guidelines: White Paper of the ACR Incidental Findings Committee II on Vascular Findings. J Am Coll Radiol 2013; 10:789-794.    Electronically Signed   By: Elon Alas   On: 12/12/2014 02:29   Dg Chest Port 1 View  12/11/2014   CLINICAL DATA:  Nausea vomiting diarrhea and dyspnea  EXAM: PORTABLE CHEST - 1 VIEW  COMPARISON:  11/07/2013  FINDINGS: A single AP portable view of the chest demonstrates no focal  airspace consolidation or alveolar edema. The lungs are grossly clear. There is no large effusion or pneumothorax. Cardiac and mediastinal contours appear unremarkable. There is no significant interval change.  IMPRESSION: No acute findings   Electronically Signed   By: Andreas Newport M.D.   On: 12/11/2014 23:33     EKG Interpretation   Date/Time:  Sunday December 11 2014 23:35:41 EST Ventricular Rate:  105 PR Interval:  186 QRS Duration: 103 QT Interval:  342 QTC Calculation: 452 R Axis:   -72 Text Interpretation:  Sinus tachycardia Inferolateral infarct, old  Confirmed by Hazle Coca [phone]) on 12/11/2014 11:57:14 PM      MDM   Final diagnoses:  Abdominal pain, unspecified abdominal location  Diarrhea  Hypotensive episode    Mr. Gullett presents for evaluation of abdominal pain, vomiting, diarrhea. On initial evaluation patient was hypotensive and tachycardic. Blood pressure improved with IV fluid hydration. Tachycardia appeared to be atrial fibrillation with RVR in the monitor. By the time EKG was obtained EKG is consistent with sinus tachycardia. EKG also demonstrates slow her heart rate and patient had improvement in his blood pressure at that time. Patient did initially have a lactic acidosis which improved with IV fluid hydration. Patient does continue to have abdominal pain, frequent diarrhea in the emergency department. CT scan was obtained, which demonstrates no evidence of diverticulitis. Discussed with medicine regarding admission for abdominal pain and diarrhea with dehydration to ensure that lactate continues to clear and patient does not suffer recurrent dehydration given his large volume of  stool output.    Quintella Reichert, MD 12/12/14 [phone]

## 2014-12-11 NOTE — ED Notes (Addendum)
Patient presents via EMS with N/V/D since about noon today.  States he feels weak like he might pass out.  EMS initially BP 82/44 HR 140-150.  Upon arrival to ED BP 101/57, HR 90-120.  [id number]  Zofran 4mg  by EMS

## 2014-12-12 ENCOUNTER — Emergency Department (HOSPITAL_COMMUNITY): Payer: Medicare Other

## 2014-12-12 ENCOUNTER — Encounter (HOSPITAL_COMMUNITY): Payer: Self-pay | Admitting: Radiology

## 2014-12-12 DIAGNOSIS — E785 Hyperlipidemia, unspecified: Secondary | ICD-10-CM | POA: Diagnosis present

## 2014-12-12 DIAGNOSIS — M1990 Unspecified osteoarthritis, unspecified site: Secondary | ICD-10-CM | POA: Diagnosis present

## 2014-12-12 DIAGNOSIS — I482 Chronic atrial fibrillation: Secondary | ICD-10-CM | POA: Diagnosis present

## 2014-12-12 DIAGNOSIS — I10 Essential (primary) hypertension: Secondary | ICD-10-CM | POA: Diagnosis present

## 2014-12-12 DIAGNOSIS — K529 Noninfective gastroenteritis and colitis, unspecified: Secondary | ICD-10-CM | POA: Diagnosis present

## 2014-12-12 DIAGNOSIS — E119 Type 2 diabetes mellitus without complications: Secondary | ICD-10-CM | POA: Diagnosis present

## 2014-12-12 DIAGNOSIS — Z87891 Personal history of nicotine dependence: Secondary | ICD-10-CM | POA: Diagnosis not present

## 2014-12-12 DIAGNOSIS — M545 Low back pain: Secondary | ICD-10-CM | POA: Diagnosis present

## 2014-12-12 DIAGNOSIS — R112 Nausea with vomiting, unspecified: Secondary | ICD-10-CM | POA: Diagnosis present

## 2014-12-12 DIAGNOSIS — Z955 Presence of coronary angioplasty implant and graft: Secondary | ICD-10-CM | POA: Diagnosis not present

## 2014-12-12 DIAGNOSIS — E780 Pure hypercholesterolemia: Secondary | ICD-10-CM | POA: Diagnosis present

## 2014-12-12 DIAGNOSIS — R197 Diarrhea, unspecified: Secondary | ICD-10-CM | POA: Diagnosis present

## 2014-12-12 DIAGNOSIS — Z9049 Acquired absence of other specified parts of digestive tract: Secondary | ICD-10-CM | POA: Diagnosis present

## 2014-12-12 DIAGNOSIS — A419 Sepsis, unspecified organism: Principal | ICD-10-CM

## 2014-12-12 DIAGNOSIS — K862 Cyst of pancreas: Secondary | ICD-10-CM | POA: Diagnosis present

## 2014-12-12 DIAGNOSIS — I2510 Atherosclerotic heart disease of native coronary artery without angina pectoris: Secondary | ICD-10-CM | POA: Diagnosis present

## 2014-12-12 DIAGNOSIS — R109 Unspecified abdominal pain: Secondary | ICD-10-CM | POA: Diagnosis present

## 2014-12-12 DIAGNOSIS — K219 Gastro-esophageal reflux disease without esophagitis: Secondary | ICD-10-CM | POA: Diagnosis present

## 2014-12-12 DIAGNOSIS — I252 Old myocardial infarction: Secondary | ICD-10-CM | POA: Diagnosis not present

## 2014-12-12 DIAGNOSIS — G8929 Other chronic pain: Secondary | ICD-10-CM | POA: Diagnosis present

## 2014-12-12 DIAGNOSIS — R6510 Systemic inflammatory response syndrome (SIRS) of non-infectious origin without acute organ dysfunction: Secondary | ICD-10-CM | POA: Diagnosis present

## 2014-12-12 DIAGNOSIS — I714 Abdominal aortic aneurysm, without rupture: Secondary | ICD-10-CM | POA: Diagnosis present

## 2014-12-12 DIAGNOSIS — Z888 Allergy status to other drugs, medicaments and biological substances status: Secondary | ICD-10-CM | POA: Diagnosis not present

## 2014-12-12 DIAGNOSIS — G629 Polyneuropathy, unspecified: Secondary | ICD-10-CM | POA: Diagnosis present

## 2014-12-12 DIAGNOSIS — G4733 Obstructive sleep apnea (adult) (pediatric): Secondary | ICD-10-CM | POA: Diagnosis present

## 2014-12-12 LAB — TROPONIN I: Troponin I: 0.03 ng/mL (ref <0.031)

## 2014-12-12 LAB — GLUCOSE, CAPILLARY
Glucose-Capillary: 165 mg/dL — ABNORMAL HIGH (ref 70–99)
Glucose-Capillary: 166 mg/dL — ABNORMAL HIGH (ref 70–99)
Glucose-Capillary: 181 mg/dL — ABNORMAL HIGH (ref 70–99)
Glucose-Capillary: 184 mg/dL — ABNORMAL HIGH (ref 70–99)
Glucose-Capillary: 202 mg/dL — ABNORMAL HIGH (ref 70–99)

## 2014-12-12 LAB — CBC WITH DIFFERENTIAL/PLATELET
Basophils Absolute: 0 10*3/uL (ref 0.0–0.1)
Basophils Relative: 0 % (ref 0–1)
Eosinophils Absolute: 0 10*3/uL (ref 0.0–0.7)
Eosinophils Relative: 0 % (ref 0–5)
HCT: 43.6 % (ref 39.0–52.0)
Hemoglobin: 14.8 g/dL (ref 13.0–17.0)
Lymphocytes Relative: 11 % — ABNORMAL LOW (ref 12–46)
Lymphs Abs: 0.9 10*3/uL (ref 0.7–4.0)
MCH: 31 pg (ref 26.0–34.0)
MCHC: 33.9 g/dL (ref 30.0–36.0)
MCV: 91.2 fL (ref 78.0–100.0)
Monocytes Absolute: 1.4 10*3/uL — ABNORMAL HIGH (ref 0.1–1.0)
Monocytes Relative: 18 % — ABNORMAL HIGH (ref 3–12)
Neutro Abs: 5.6 10*3/uL (ref 1.7–7.7)
Neutrophils Relative %: 71 % (ref 43–77)
Platelets: 120 10*3/uL — ABNORMAL LOW (ref 150–400)
RBC: 4.78 MIL/uL (ref 4.22–5.81)
RDW: 13.4 % (ref 11.5–15.5)
WBC: 7.9 10*3/uL (ref 4.0–10.5)

## 2014-12-12 LAB — I-STAT CG4 LACTIC ACID, ED: Lactic Acid, Venous: 2.61 mmol/L (ref 0.5–2.0)

## 2014-12-12 LAB — COMPREHENSIVE METABOLIC PANEL
ALT: 25 U/L (ref 0–53)
AST: 28 U/L (ref 0–37)
Albumin: 3.5 g/dL (ref 3.5–5.2)
Alkaline Phosphatase: 48 U/L (ref 39–117)
Anion gap: 9 (ref 5–15)
BUN: 17 mg/dL (ref 6–23)
CO2: 22 mmol/L (ref 19–32)
Calcium: 9.1 mg/dL (ref 8.4–10.5)
Chloride: 108 mmol/L (ref 96–112)
Creatinine, Ser: 0.97 mg/dL (ref 0.50–1.35)
GFR calc Af Amer: >90 mL/min (ref >90)
GFR calc non Af Amer: 79 mL/min — ABNORMAL LOW (ref >90)
Glucose, Bld: 250 mg/dL — ABNORMAL HIGH (ref 70–99)
Potassium: 4.8 mmol/L (ref 3.5–5.1)
Sodium: 139 mmol/L (ref 135–145)
Total Bilirubin: 1.2 mg/dL (ref 0.3–1.2)
Total Protein: 6.5 g/dL (ref 6.0–8.3)

## 2014-12-12 LAB — URINE MICROSCOPIC-ADD ON

## 2014-12-12 LAB — URINALYSIS, ROUTINE W REFLEX MICROSCOPIC
Bilirubin Urine: NEGATIVE
Glucose, UA: 100 mg/dL — AB
Ketones, ur: NEGATIVE mg/dL
Leukocytes, UA: NEGATIVE
Nitrite: NEGATIVE
Protein, ur: 100 mg/dL — AB
Specific Gravity, Urine: 1.016 (ref 1.005–1.030)
Urobilinogen, UA: 0.2 mg/dL (ref 0.0–1.0)
pH: 6 (ref 5.0–8.0)

## 2014-12-12 LAB — LACTIC ACID, PLASMA
Lactic Acid, Venous: 3 mmol/L (ref 0.5–2.0)
Lactic Acid, Venous: 3.7 mmol/L (ref 0.5–2.0)
Lactic Acid, Venous: 3.6 mmol/L (ref 0.5–2.0)

## 2014-12-12 LAB — CLOSTRIDIUM DIFFICILE BY PCR: Toxigenic C. Difficile by PCR: NEGATIVE

## 2014-12-12 LAB — TSH: TSH: 0.317 u[IU]/mL — ABNORMAL LOW (ref 0.350–4.500)

## 2014-12-12 MED ORDER — RIVAROXABAN 20 MG PO TABS
20.0000 mg | ORAL_TABLET | Freq: Every day | ORAL | Status: DC
  Administered 2014-12-12 – 2014-12-13 (×2): 20 mg via ORAL
  Filled 2014-12-12 (×3): qty 1

## 2014-12-12 MED ORDER — FINASTERIDE 1 MG PO TABS: 1.0000 mg | ORAL_TABLET | Freq: Every day | ORAL | Status: DC

## 2014-12-12 MED ORDER — GLIMEPIRIDE 1 MG PO TABS
1.0000 mg | ORAL_TABLET | Freq: Every day | ORAL | Status: DC
  Administered 2014-12-12 – 2014-12-14 (×3): 1 mg via ORAL
  Filled 2014-12-12 (×4): qty 1

## 2014-12-12 MED ORDER — PREGABALIN 75 MG PO CAPS
75.0000 mg | ORAL_CAPSULE | Freq: Two times a day (BID) | ORAL | Status: DC
  Filled 2014-12-12 (×2): qty 1

## 2014-12-12 MED ORDER — SODIUM CHLORIDE 0.9 % IV SOLN
INTRAVENOUS | Status: DC
Start: 2014-12-12 — End: 2014-12-13
  Administered 2014-12-12: 1000 mL via INTRAVENOUS
  Administered 2014-12-13: 03:00:00 via INTRAVENOUS

## 2014-12-12 MED ORDER — VITAMIN B-12 1000 MCG PO TABS
1000.0000 ug | ORAL_TABLET | Freq: Every day | ORAL | Status: DC
  Administered 2014-12-12 – 2014-12-14 (×3): 1000 ug via ORAL
  Filled 2014-12-12 (×3): qty 1

## 2014-12-12 MED ORDER — ACETAMINOPHEN 650 MG RE SUPP: 650.0000 mg | Freq: Four times a day (QID) | RECTAL | Status: DC | PRN

## 2014-12-12 MED ORDER — IOHEXOL 300 MG/ML  SOLN
100.0000 mL | Freq: Once | INTRAMUSCULAR | Status: AC | PRN
  Administered 2014-12-12: 100 mL via INTRAVENOUS

## 2014-12-12 MED ORDER — ATORVASTATIN CALCIUM 10 MG PO TABS
10.0000 mg | ORAL_TABLET | Freq: Every day | ORAL | Status: DC
  Administered 2014-12-12 – 2014-12-14 (×3): 10 mg via ORAL
  Filled 2014-12-12 (×3): qty 1

## 2014-12-12 MED ORDER — SODIUM CHLORIDE 0.9 % IJ SOLN
3.0000 mL | Freq: Two times a day (BID) | INTRAMUSCULAR | Status: DC
  Administered 2014-12-12 – 2014-12-14 (×3): 3 mL via INTRAVENOUS

## 2014-12-12 MED ORDER — METOPROLOL TARTRATE 50 MG PO TABS
50.0000 mg | ORAL_TABLET | Freq: Two times a day (BID) | ORAL | Status: DC
  Administered 2014-12-12 – 2014-12-14 (×4): 50 mg via ORAL
  Filled 2014-12-12 (×6): qty 1

## 2014-12-12 MED ORDER — PANTOPRAZOLE SODIUM 40 MG PO TBEC
40.0000 mg | DELAYED_RELEASE_TABLET | Freq: Every day | ORAL | Status: DC
  Administered 2014-12-12 – 2014-12-14 (×3): 40 mg via ORAL
  Filled 2014-12-12 (×2): qty 1

## 2014-12-12 MED ORDER — IOHEXOL 300 MG/ML  SOLN
25.0000 mL | Freq: Once | INTRAMUSCULAR | Status: AC | PRN
  Administered 2014-12-12: 25 mL via ORAL

## 2014-12-12 MED ORDER — INSULIN ASPART 100 UNIT/ML ~~LOC~~ SOLN
0.0000 [IU] | Freq: Three times a day (TID) | SUBCUTANEOUS | Status: DC
  Administered 2014-12-12: 3 [IU] via SUBCUTANEOUS

## 2014-12-12 MED ORDER — SODIUM CHLORIDE 0.9 % IV BOLUS (SEPSIS)
1000.0000 mL | Freq: Once | INTRAVENOUS | Status: AC
  Administered 2014-12-12: 1000 mL via INTRAVENOUS

## 2014-12-12 MED ORDER — SODIUM CHLORIDE 0.9 % IV SOLN
INTRAVENOUS | Status: DC
  Administered 2014-12-12: 1000 mL via INTRAVENOUS

## 2014-12-12 MED ORDER — ONDANSETRON HCL 4 MG/2ML IJ SOLN
4.0000 mg | Freq: Four times a day (QID) | INTRAMUSCULAR | Status: DC | PRN
  Administered 2014-12-12 – 2014-12-13 (×3): 4 mg via INTRAVENOUS
  Filled 2014-12-12 (×3): qty 2

## 2014-12-12 MED ORDER — SODIUM CHLORIDE 0.9 % IV BOLUS (SEPSIS)
500.0000 mL | Freq: Once | INTRAVENOUS | Status: AC
  Administered 2014-12-12: 500 mL via INTRAVENOUS

## 2014-12-12 MED ORDER — ACETAMINOPHEN 325 MG PO TABS
650.0000 mg | ORAL_TABLET | Freq: Four times a day (QID) | ORAL | Status: DC | PRN
  Administered 2014-12-14: 650 mg via ORAL
  Filled 2014-12-12: qty 2

## 2014-12-12 MED ORDER — ONDANSETRON HCL 4 MG PO TABS: 4.0000 mg | ORAL_TABLET | Freq: Four times a day (QID) | ORAL | Status: DC | PRN

## 2014-12-12 MED ORDER — ONDANSETRON HCL 4 MG/2ML IJ SOLN
4.0000 mg | Freq: Once | INTRAMUSCULAR | Status: AC
  Administered 2014-12-12: 4 mg via INTRAVENOUS
  Filled 2014-12-12: qty 2

## 2014-12-12 MED ORDER — OXYCODONE HCL 5 MG PO TABS
5.0000 mg | ORAL_TABLET | ORAL | Status: DC | PRN
  Administered 2014-12-12 – 2014-12-14 (×4): 5 mg via ORAL
  Filled 2014-12-12 (×4): qty 1

## 2014-12-12 MED ORDER — INSULIN ASPART 100 UNIT/ML ~~LOC~~ SOLN
0.0000 [IU] | SUBCUTANEOUS | Status: DC
Start: 2014-12-12 — End: 2014-12-14
  Administered 2014-12-12 (×3): 2 [IU] via SUBCUTANEOUS
  Administered 2014-12-13 (×2): 1 [IU] via SUBCUTANEOUS
  Administered 2014-12-13 (×2): 2 [IU] via SUBCUTANEOUS
  Administered 2014-12-13 – 2014-12-14 (×3): 1 [IU] via SUBCUTANEOUS
  Administered 2014-12-14 (×2): 2 [IU] via SUBCUTANEOUS

## 2014-12-12 MED ORDER — SODIUM CHLORIDE 0.9 % IV BOLUS (SEPSIS): 1000.0000 mL | INTRAVENOUS | Status: DC | PRN

## 2014-12-12 MED ORDER — SODIUM CHLORIDE 0.9 % IV SOLN
INTRAVENOUS | Status: DC
  Administered 2014-12-12: 100 mL/h via INTRAVENOUS

## 2014-12-12 NOTE — Progress Notes (Signed)
NP on call notified as patient's BP 91/43.  542mL bolus ordered and administered.  BP 127/64 after bolus administration.  Foley catheter placed per order to closely monitor output; patient and wife educated.  Patient and wife deny any questions or concerns at this time.  Will continue to monitor.

## 2014-12-12 NOTE — Care Management Note (Signed)
    Page 1 of 1   12/14/2014     1:46:04 PM CARE MANAGEMENT NOTE 12/14/2014  Patient:  [name], [name]   Account Number:  [id number]  Date Initiated:  12/12/2014  Documentation initiated by:  Dhruvan Gullion  Subjective/Objective Assessment:   Pt adm on 12/11/14 with N/V, dehydration.  PTA, pt resides at home with spouse.     Action/Plan:   Will follow for dc needs as pt progresses.   Anticipated DC Date:  12/14/2014   Anticipated DC Plan:  [place]  CM consult      [facility] Choice  HOME HEALTH   Choice offered to / List presented to:  C-1 Patient        [place] arranged  [facility]      [place].   Status of service:  Completed, signed off Medicare Important Message given?  YES (If response is "NO", the following Medicare IM given date fields will be blank) Date Medicare IM given:  12/14/2014 Medicare IM given by:  Ulla Mckiernan Date Additional Medicare IM given:   Additional Medicare IM given by:    Discharge Disposition:  [place]  Per UR Regulation:  Reviewed for med. necessity/level of care/duration of stay  If discussed at [place] of Stay Meetings, dates discussed:    Comments:

## 2014-12-12 NOTE — ED Notes (Signed)
Patient laying on the left side

## 2014-12-12 NOTE — ED Notes (Signed)
Ice chips given   Instructed patient and wife that he should take few chips at a time.

## 2014-12-12 NOTE — Progress Notes (Signed)
Inpatient Diabetes Program Recommendations  AACE/ADA: New Consensus Statement on Inpatient Glycemic Control (2013)  Target Ranges:  Prepandial:   less than 140 mg/dL      Peak postprandial:   less than 180 mg/dL (1-2 hours)      Critically ill patients:  140 - 180 mg/dL   Inpatient Diabetes Program Recommendations Correction (SSI): Glucose is high. Please increase correction to moderate q 4 hrs Oral Agents: Would not use Metformin following the contrast studies while with active diarrhea HgbA1C: Please order a current HgbA1C. Last value of 6.3% in 2012  Thank you Rosita Kea, RN, MSN, CDE  Diabetes Inpatient Program Office: [phone] Pager: [phone] 8:00 am to 5:00 pm

## 2014-12-12 NOTE — Progress Notes (Signed)
Lactic acid 3.6, critical lab value

## 2014-12-12 NOTE — Progress Notes (Signed)
Patient Demographics  [name], is a 74 y.o. male, DOB - 1941/03/26, UKG:[id number]  Admit date - 12/11/2014   Admitting Physician Rise Patience, MD  Outpatient Primary MD for the patient is Irven Shelling, MD  LOS - 0   Chief Complaint  Patient presents with  . Nausea  . Emesis  . Diarrhea        Subjective:   [name] today has, No headache, No chest pain, No abdominal pain - No Nausea, No new weakness tingling or numbness, No Cough - SOB.   Is having diarrhea.  Assessment & Plan    Principal Problem:   SIRS (systemic inflammatory response syndrome) Active Problems:   Abdominal pain   Nausea vomiting and diarrhea   Chronic atrial fibrillation   Diabetes mellitus type 2, controlled   Hyperlipidemia  1. Sepsis due to gastroenteritis. Most likely viral. Stool studies pending. Did have exposure to antibiotics one month ago for root canal procedure. Lactic acid is still elevated, continue aggressive IV hydration, bowel rest, supportive care. Follow stool studies.   2. DM type II. Currently on Glucotrol along with sliding scale. Monitor CBGs.  CBG (last 3)   Recent Labs  12/12/14 0605  GLUCAP 202*     3. Chronic atrial fibrillation. [place] Vasc 2 score is - 4 - Beta blocker for rate control, continue xaralto.   4. Essential hypertension. Blood pressure stable on beta blocker continue.   5. Dyslipidemia. Home dose statin.   6.OSA - see Pap daily at bedtime.   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 Follow with PCP closely post discharge.     Code Status: Full  Family Communication: Wife  Disposition Plan: Home   Procedures CT Abd Pelvis   Consults   None   Medications  Scheduled Meds: . atorvastatin  10 mg Oral Daily  . glimepiride  1 mg  Oral Q breakfast  . insulin aspart  0-9 Units Subcutaneous Q4H  . metoprolol  50 mg Oral BID  . pantoprazole  40 mg Oral Daily  . pregabalin  75 mg Oral BID  . rivaroxaban  20 mg Oral Q supper  . sodium chloride  3 mL Intravenous Q12H  . vitamin B-12  1,000 mcg Oral Daily   Continuous Infusions: . sodium chloride     PRN Meds:.acetaminophen **OR** acetaminophen, ondansetron **OR** ondansetron (ZOFRAN) IV, oxyCODONE  DVT Prophylaxis  Xaralto  Lab Results  Component Value Date   PLT 120* 12/12/2014    Antibiotics     Anti-infectives    None          Objective:   Filed Vitals:   12/12/14 0200 12/12/14 0300 12/12/14 0315 12/12/14 0355  BP:  116/65 117/64 118/63  Pulse:  100 101 97  Temp: 98.8 F (37.1 C)   98 F (36.7 C)  TempSrc: Oral   Oral  Resp:  14 13   Height:      Weight:    99.338 kg (219 lb)  SpO2:  96% 100% 96%    Wt Readings from Last 3 Encounters:  12/12/14 99.338 kg (219 lb)  11/15/14 94.348 kg (208 lb)  01/19/14 94.802 kg (209 lb)     Intake/Output Summary (Last 24 hours)  at 12/12/14 1119 Last data filed at 12/12/14 1031  Gross per 24 hour  Intake   2684 ml  Output      0 ml  Net   2684 ml     Physical Exam  Awake Alert, Oriented X 3, No new F.N deficits, Normal affect [place].AT,PERRAL Supple Neck,No JVD, No cervical lymphadenopathy appriciated.  Symmetrical Chest wall movement, Good air movement bilaterally, CTAB RRR,No Gallops,Rubs or new Murmurs, No Parasternal Heave +ve B.Sounds, Abd Soft, No tenderness, No organomegaly appriciated, No rebound - guarding or rigidity. No Cyanosis, Clubbing or edema, No new Rash or bruise      Data Review   Micro Results Recent Results (from the past 240 hour(s))  Clostridium Difficile by PCR     Status: None   Collection Time: 12/11/14 11:27 PM  Result Value Ref Range Status   C difficile by pcr NEGATIVE NEGATIVE Final    Radiology Reports Ct Abdomen Pelvis W Contrast  12/12/2014    CLINICAL DATA:  Nausea, vomiting and diarrhea beginning yesterday at noon, weakness, pre syncopal. Hypotensive. Tachycardia. History of abdominal aortic aneurysm, acid reflux, incisional hernia.  EXAM: CT ABDOMEN AND PELVIS WITH CONTRAST  TECHNIQUE: Multidetector CT imaging of the abdomen and pelvis was performed using the standard protocol following bolus administration of intravenous contrast.  CONTRAST:  100 cc Omnipaque 300  COMPARISON:  MRI of the abdomen June 23, 2014 and CT of the abdomen and pelvis June 10, 2014  FINDINGS: LUNG BASES: Stable 9 mm pleural-based LEFT lower lobe nodule. Heart is mildly enlarged, trace pericardial effusion.  SOLID ORGANS: The liver, spleen, pancreas and adrenal glands are unremarkable. Status post cholecystectomy. 9 mm hypodensity and tail of the pancreas go, as reported on prior MRI. Adrenal glands are unremarkable.  GASTROINTESTINAL TRACT: The stomach, small and large bowel are normal in course and caliber without inflammatory changes. Fluid filled small large bowel. Normal appendix.  KIDNEYS/ URINARY TRACT: Kidneys are orthotopic, demonstrating symmetric enhancement. No nephrolithiasis, hydronephrosis or solid renal masses. Focal scarring LEFT lower pole coarse calcification, unchanged. 2.9 cm RIGHT interpolar cyst, previously characterized on MR. 3 mm LEFT lower pole, punctate LEFT interpolar nephrolithiasis. Too small to characterize hypodensities in kidneys bilaterally. The unopacified ureters are normal in course and caliber. Delayed imaging through the kidneys demonstrates symmetric prompt contrast excretion within the proximal urinary collecting system. Urinary bladder is partially distended and unremarkable.  PERITONEUM/RETROPERITONEUM: 3.1 x 3.7 cm infrarenal aortic aneurysm, similar to prior examination, status post apparent aortoiliac surgical intervention. Moderate to severe calcific atherosclerosis of the aortoiliac vessels with probable occlusion of RIGHT  internal Common iliac artery. No lymphadenopathy by CT size criteria. Prostate is upper limits of normal in size. No intraperitoneal free fluid nor free air.  SOFT TISSUE/OSSEOUS STRUCTURES: Non-suspicious. Small fat containing LEFT inguinal hernia. Status post L3-4 and L4-5 PLIF. Stable degenerative change of lumbar spine.  IMPRESSION: Fluid filled nondistended small and large bowel may reflect enteritis. No bowel obstruction.  Re demonstration of subcentimeter pancreatic tail cystic mass, for prior recommendation, follow-up again recommended.  Nonobstructing LEFT nephrolithiasis measure up to 3 mm.  Stable 3.1 x 3.7 cm infrarenal aortic aneurysm, status post apparent aortoiliac surgical repair appearing Recommend followup by ultrasound in 2 years. This recommendation follows ACR consensus guidelines: White Paper of the ACR Incidental Findings Committee II on Vascular Findings. J Am Coll Radiol 2013; 10:789-794.   Electronically Signed   By: Elon Alas   On: 12/12/2014 02:29   Dg Chest [facility]  1 View  12/11/2014   CLINICAL DATA:  Nausea vomiting diarrhea and dyspnea  EXAM: PORTABLE CHEST - 1 VIEW  COMPARISON:  11/07/2013  FINDINGS: A single AP portable view of the chest demonstrates no focal airspace consolidation or alveolar edema. The lungs are grossly clear. There is no large effusion or pneumothorax. Cardiac and mediastinal contours appear unremarkable. There is no significant interval change.  IMPRESSION: No acute findings   Electronically Signed   By: Andreas Newport M.D.   On: 12/11/2014 23:33     CBC  Recent Labs Lab 12/11/14 2300 12/12/14 0442  WBC 15.0* 7.9  HGB 16.1 14.8  HCT 46.2 43.6  PLT 150 120*  MCV 90.6 91.2  MCH 31.6 31.0  MCHC 34.8 33.9  RDW 13.2 13.4  LYMPHSABS 1.6 0.9  MONOABS 1.8* 1.4*  EOSABS 0.0 0.0  BASOSABS 0.0 0.0    Chemistries   Recent Labs Lab 12/11/14 2300 12/12/14 0442  NA 138 139  K 4.1 4.8  CL 103 108  CO2 26 22  GLUCOSE 203* 250*  BUN 14  17  CREATININE 1.07 0.97  CALCIUM 10.3 9.1  AST 26 28  ALT 28 25  ALKPHOS 59 48  BILITOT 0.9 1.2   ------------------------------------------------------------------------------------------------------------------ estimated creatinine clearance is 80.3 mL/min (by C-G formula based on Cr of 0.97). ------------------------------------------------------------------------------------------------------------------ No results for input(s): HGBA1C in the last 72 hours. ------------------------------------------------------------------------------------------------------------------ No results for input(s): CHOL, HDL, LDLCALC, TRIG, CHOLHDL, LDLDIRECT in the last 72 hours. ------------------------------------------------------------------------------------------------------------------  Recent Labs  12/12/14 0442  TSH 0.317*   ------------------------------------------------------------------------------------------------------------------ No results for input(s): VITAMINB12, FOLATE, FERRITIN, TIBC, IRON, RETICCTPCT in the last 72 hours.  Coagulation profile  Recent Labs Lab 12/11/14 2300  INR 1.06    No results for input(s): DDIMER in the last 72 hours.  Cardiac Enzymes  Recent Labs Lab 12/11/14 2300 12/12/14 0442  TROPONINI <0.03 0.03   ------------------------------------------------------------------------------------------------------------------ Invalid input(s): POCBNP     Time Spent in minutes   35   Basilia Stuckert K M.D on 12/12/2014 at 11:19 AM  Between 7am to 7pm - Pager - [phone]  After 7pm go to [email or web address] - [facility] Office  [phone]

## 2014-12-12 NOTE — H&P (Signed)
[facility] History and Physical  [name] VOJ:[id number] DOB: [birth year] DOA: 12/11/2014  Referring physician: ER physician. PCP: Irven Shelling, MD   Chief Complaint: Nausea vomiting diarrhea with abdominal pain.  HPI: [name] is a 74 y.o. male with history of chronic atrial fibrillation, CAD, hypertension, diabetes mellitus type 2, OSA presents to the ER because of worsening diarrhea with nausea vomiting and abdominal discomfort since yesterday morning. Patient states that he was doing fine and from yesterday morning he started developing nausea vomiting and diarrhea. Denies any recent travel or sick contacts. He denies recently using any antibiotics. Patient was recently started on Lyrica for his neuropathy last week. CT abdomen and pelvis done in the ER shows fluid-filled bowels which may reflect enteritis. Patient has had at least 4 bowel movements in the ER which was loose and nonbloody. Patient has been admitted for further management of his gastroenteritis. Initially in the ER patient was tachycardic and mildly hypotensive which improved with 2 L normal seen bolus. Patient denies any chest pain or shortness of breath productive cough fever chills.   Review of Systems: As presented in the history of presenting illness, rest negative.  Past Medical History  Diagnosis Date  . Hypercholesteremia     takes lipitor daily  . AAA (abdominal aortic aneurysm)   . Pain     back pain chronic- seen at pain clinic  . Neuropathy     in legs  . Bronchitis     hx of  . Ileus   . Arthritis     BacK  . Incisional hernia     "small, from AAA"  . Coronary atherosclerosis of native coronary artery     ejection fraction of 40%, prior inferior myocardial infarction in February 1993, PTCA, LAD rotational atherectomy in August of 1998, acute inferolateral myocardial infarction, ventricular fibrillation in July of 1999, PTCA and stent of obtuse marginal 1, 100% RCA 1/12 with  collaterals from patent circumflex and LAD  . Hypertension     takes Ramipril daily  . MI (myocardial infarction)     1992 and 1997 sees Dr. Linard Millers as needed   . Dysrhythmia     "got out of rhythym a couple , but it straightened back out."  . Sleep apnea     uses CPAP--sleep study done at least 68yrs ago  . Chronic back pain   . Dry skin   . Acid reflux     takes Prilosec and Protonix daily  . Diabetes mellitus     takes Metformin daily  . Complication of anesthesia     -years ago hair fell out.;ileus after 2 of his surgeries   Past Surgical History  Procedure Laterality Date  . Abdominal surgery    . Abdominal aortic aneurysm repair    . Colonoscopy    . Cholecystectomy    . Back surgery  2013    Miminal Invasive x2 in [place].  . Cardiac catheterization  2009/2012  . Coronary angioplasty      2 stents  . Coronary stent placement     Social History:  reports that he quit smoking about 22 years ago. He has never used smokeless tobacco. He reports that he drinks alcohol. He reports that he does not use illicit drugs. Where does patient live home. Can patient participate in ADLs? Yes.  Allergies  Allergen Reactions  . Cymbalta [Duloxetine Hcl] Nausea And Vomiting and Other (See Comments)    Rapid drop in blood pressure  .  Gabapentin Other (See Comments)    Makes me goofy, keeps me off balance, clouds my thinking  . Lyrica [Pregabalin] Other (See Comments)    Makes me goofy, keeps me off balance, clouds my thinking  . Spinach Other (See Comments)    Patient does not like     Family History:  Family History  Problem Relation Age of Onset  . Diabetes Mother   . Coronary artery disease Mother   . Diabetes Father   . Arthritis Father   . Rheum arthritis Father   . Coronary artery disease Father       Prior to Admission medications   Medication Sig Start Date End Date Taking? Authorizing Provider  atorvastatin (LIPITOR) 10 MG tablet Take 10 mg by mouth daily.    Yes Historical Provider, MD  Coenzyme Q10 (CO Q 10) 100 MG CAPS Take 100 mg by mouth daily.   Yes Historical Provider, MD  finasteride (PROPECIA) 1 MG tablet Take 1 mg by mouth daily.    Yes Historical Provider, MD  glimepiride (AMARYL) 1 MG tablet  07/20/13  Yes Historical Provider, MD  metFORMIN (GLUCOPHAGE) 1000 MG tablet Take 1,000 mg by mouth 2 (two) times daily with a meal.   Yes Historical Provider, MD  metoprolol (LOPRESSOR) 50 MG tablet TAKE ONE TABLET BY MOUTH TWICE DAILY 09/02/14  Yes Belva Crome III, MD  Multiple Vitamin (MULTIVITAMIN WITH MINERALS) TABS Take 1 tablet by mouth daily.   Yes Historical Provider, MD  omeprazole (PRILOSEC) 20 MG capsule Take 20 mg by mouth daily. 09/14/12  Yes Historical Provider, MD  pregabalin (LYRICA) 75 MG capsule Take 75 mg by mouth 2 (two) times daily.   Yes Historical Provider, MD  rivaroxaban (XARELTO) 20 MG TABS tablet Take 1 tablet (20 mg total) by mouth daily with supper. 11/22/14  Yes Belva Crome III, MD  vitamin B-12 (CYANOCOBALAMIN) 1000 MCG tablet Take 1,000 mcg by mouth daily.    Yes Historical Provider, MD  ACCU-CHEK FASTCLIX LANCETS [place]  07/13/13   Historical Provider, MD  ACCU-CHEK SMARTVIEW test strip  07/30/13   Historical Provider, MD    Physical Exam: Filed Vitals:   12/12/14 0200 12/12/14 0300 12/12/14 0315 12/12/14 0355  BP:  116/65 117/64 118/63  Pulse:  100 101 97  Temp: 98.8 F (37.1 C)   98 F (36.7 C)  TempSrc: Oral   Oral  Resp:  14 13   Height:      Weight:    99.338 kg (219 lb)  SpO2:  96% 100% 96%     General:  Well-developed and nourished.  Eyes: Anicteric no pallor.  ENT: No discharge from the ears eyes nose or more.  Neck: No mass felt.  Cardiovascular: S1-S2 heard.  Respiratory: No rhonchi or crepitations.  Abdomen: Soft mild diffuse tenderness no guarding or rigidity.  Skin: No rash.  Musculoskeletal: No edema.  Psychiatric: Appears normal.  Neurologic: Alert awake oriented to time  place and person. Moves all extremities 5 x 5.  Labs on Admission:  Basic Metabolic Panel:  Recent Labs Lab 12/11/14 2300  NA 138  K 4.1  CL 103  CO2 26  GLUCOSE 203*  BUN 14  CREATININE 1.07  CALCIUM 10.3   Liver Function Tests:  Recent Labs Lab 12/11/14 2300  AST 26  ALT 28  ALKPHOS 59  BILITOT 0.9  PROT 7.7  ALBUMIN 4.5    Recent Labs Lab 12/11/14 2300  LIPASE 35   No results  for input(s): AMMONIA in the last 168 hours. CBC:  Recent Labs Lab 12/11/14 2300  WBC 15.0*  NEUTROABS 11.5*  HGB 16.1  HCT 46.2  MCV 90.6  PLT 150   Cardiac Enzymes:  Recent Labs Lab 12/11/14 2300  TROPONINI <0.03    BNP (last 3 results) No results for input(s): BNP in the last 8760 hours.  ProBNP (last 3 results) No results for input(s): PROBNP in the last 8760 hours.  CBG: No results for input(s): GLUCAP in the last 168 hours.  Radiological Exams on Admission: Ct Abdomen Pelvis W Contrast  12/12/2014   CLINICAL DATA:  Nausea, vomiting and diarrhea beginning yesterday at noon, weakness, pre syncopal. Hypotensive. Tachycardia. History of abdominal aortic aneurysm, acid reflux, incisional hernia.  EXAM: CT ABDOMEN AND PELVIS WITH CONTRAST  TECHNIQUE: Multidetector CT imaging of the abdomen and pelvis was performed using the standard protocol following bolus administration of intravenous contrast.  CONTRAST:  100 cc Omnipaque 300  COMPARISON:  MRI of the abdomen June 23, 2014 and CT of the abdomen and pelvis June 10, 2014  FINDINGS: LUNG BASES: Stable 9 mm pleural-based LEFT lower lobe nodule. Heart is mildly enlarged, trace pericardial effusion.  SOLID ORGANS: The liver, spleen, pancreas and adrenal glands are unremarkable. Status post cholecystectomy. 9 mm hypodensity and tail of the pancreas go, as reported on prior MRI. Adrenal glands are unremarkable.  GASTROINTESTINAL TRACT: The stomach, small and large bowel are normal in course and caliber without inflammatory  changes. Fluid filled small large bowel. Normal appendix.  KIDNEYS/ URINARY TRACT: Kidneys are orthotopic, demonstrating symmetric enhancement. No nephrolithiasis, hydronephrosis or solid renal masses. Focal scarring LEFT lower pole coarse calcification, unchanged. 2.9 cm RIGHT interpolar cyst, previously characterized on MR. 3 mm LEFT lower pole, punctate LEFT interpolar nephrolithiasis. Too small to characterize hypodensities in kidneys bilaterally. The unopacified ureters are normal in course and caliber. Delayed imaging through the kidneys demonstrates symmetric prompt contrast excretion within the proximal urinary collecting system. Urinary bladder is partially distended and unremarkable.  PERITONEUM/RETROPERITONEUM: 3.1 x 3.7 cm infrarenal aortic aneurysm, similar to prior examination, status post apparent aortoiliac surgical intervention. 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 No lymphadenopathy by CT size criteria. Prostate is upper limits of normal in size. No intraperitoneal free fluid nor free air.  SOFT TISSUE/OSSEOUS STRUCTURES: Non-suspicious. Small fat containing LEFT inguinal hernia. Status post L3-4 and L4-5 PLIF. Stable degenerative change of lumbar spine.  IMPRESSION: Fluid filled nondistended small and large bowel may reflect enteritis. No bowel obstruction.  Re demonstration of subcentimeter pancreatic tail cystic mass, for prior recommendation, follow-up again recommended.  Nonobstructing LEFT nephrolithiasis measure up to 3 mm.  Stable 3.1 x 3.7 cm infrarenal aortic aneurysm, status post apparent aortoiliac surgical repair appearing Recommend followup by ultrasound in 2 years. This recommendation follows ACR consensus guidelines: White Paper of the ACR Incidental Findings Committee II on Vascular Findings. J Am Coll Radiol 2013; 10:789-794.   Electronically Signed   By: Elon Alas   On:  12/12/2014 02:29   Dg Chest Port 1 View  12/11/2014   CLINICAL DATA:  Nausea vomiting diarrhea and dyspnea  EXAM: PORTABLE CHEST - 1 VIEW  COMPARISON:  11/07/2013  FINDINGS: A single AP portable view of the chest demonstrates no focal airspace consolidation or alveolar edema. The lungs are grossly clear. There is no large effusion or pneumothorax. Cardiac and mediastinal contours appear unremarkable. There is no significant  interval change.  IMPRESSION: No acute findings   Electronically Signed   By: Andreas Newport M.D.   On: 12/11/2014 23:33    EKG: Independently reviewed. Sinus tachycardia.  Assessment/Plan Principal Problem:   Nausea vomiting and diarrhea Active Problems:   Abdominal pain   Chronic atrial fibrillation   Diabetes mellitus type 2, controlled   Hyperlipidemia   1. SIRS - probably from gastroenteritis. At this time we will get stool cultures. Patient will be kept on contact precaution. Stool for C. difficile has been negative. Continue with gentle hydration and closely follow intake output and metabolic panel. Patient's lactic acid was mildly elevated probably from the hypotension and patient does not have any definite signs to suggest ischemic bowel at this time. I have not placed patient on any antibiotics for now. 2. Gastroenteritis - see #1. 3. Chronic atrial fibrillation - patient was initially medically tachycardic which improved with fluid boluses. At this time will closely monitor in telemetry. Continue xarelto. Patient is on metoprolol which will be continued but closely observe since patient initially was hypotensive which has improved. 4. CAD status post stenting - denies any chest pain. 5. OSA on C Pap. 6. Hyperlipidemia - on statins. 7. 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 8. Infrarenal abdominal artery aneurysm stable - needs follow-up with primary care physician.   DVT  Prophylaxis xarelto.  Code Status: Full code.  Family Communication: Patient's wife at the bedside.  Disposition Plan: Admit to inpatient.    Kipton Skillen N. [facility] Pager [phone].  If 7PM-7AM, please contact night-coverage [email or web address] Password [email or web address] 12/12/2014, 4:12 AM

## 2014-12-12 NOTE — ED Notes (Addendum)
Patient c/o feeling nauseated.

## 2014-12-13 LAB — GLUCOSE, CAPILLARY
Glucose-Capillary: 136 mg/dL — ABNORMAL HIGH (ref 70–99)
Glucose-Capillary: 139 mg/dL — ABNORMAL HIGH (ref 70–99)
Glucose-Capillary: 141 mg/dL — ABNORMAL HIGH (ref 70–99)
Glucose-Capillary: 144 mg/dL — ABNORMAL HIGH (ref 70–99)
Glucose-Capillary: 147 mg/dL — ABNORMAL HIGH (ref 70–99)
Glucose-Capillary: 171 mg/dL — ABNORMAL HIGH (ref 70–99)

## 2014-12-13 LAB — MAGNESIUM: Magnesium: 1.6 mg/dL (ref 1.5–2.5)

## 2014-12-13 LAB — BASIC METABOLIC PANEL
Anion gap: 7 (ref 5–15)
BUN: 9 mg/dL (ref 6–23)
CO2: 21 mmol/L (ref 19–32)
Calcium: 8.4 mg/dL (ref 8.4–10.5)
Chloride: 110 mmol/L (ref 96–112)
Creatinine, Ser: 0.77 mg/dL (ref 0.50–1.35)
GFR calc Af Amer: >90 mL/min (ref >90)
GFR calc non Af Amer: 87 mL/min — ABNORMAL LOW (ref >90)
Glucose, Bld: 160 mg/dL — ABNORMAL HIGH (ref 70–99)
Potassium: 4.2 mmol/L (ref 3.5–5.1)
Sodium: 138 mmol/L (ref 135–145)

## 2014-12-13 LAB — CBC
HCT: 37.6 % — ABNORMAL LOW (ref 39.0–52.0)
Hemoglobin: 12.8 g/dL — ABNORMAL LOW (ref 13.0–17.0)
MCH: 31.2 pg (ref 26.0–34.0)
MCHC: 34 g/dL (ref 30.0–36.0)
MCV: 91.7 fL (ref 78.0–100.0)
Platelets: 105 10*3/uL — ABNORMAL LOW (ref 150–400)
RBC: 4.1 MIL/uL — ABNORMAL LOW (ref 4.22–5.81)
RDW: 13.5 % (ref 11.5–15.5)
WBC: 6.7 10*3/uL (ref 4.0–10.5)

## 2014-12-13 LAB — LACTIC ACID, PLASMA: Lactic Acid, Venous: 1.6 mmol/L (ref 0.5–2.0)

## 2014-12-13 MED ORDER — SODIUM CHLORIDE 0.9 % IV SOLN
INTRAVENOUS | Status: DC
  Administered 2014-12-13 – 2014-12-14 (×2): via INTRAVENOUS

## 2014-12-13 MED ORDER — CEFTRIAXONE SODIUM IN DEXTROSE 20 MG/ML IV SOLN
1.0000 g | INTRAVENOUS | Status: DC
  Administered 2014-12-13 – 2014-12-14 (×2): 1 g via INTRAVENOUS
  Filled 2014-12-13 (×2): qty 50

## 2014-12-13 MED ORDER — TAMSULOSIN HCL 0.4 MG PO CAPS
0.4000 mg | ORAL_CAPSULE | Freq: Every day | ORAL | Status: DC
  Administered 2014-12-13 – 2014-12-14 (×2): 0.4 mg via ORAL
  Filled 2014-12-13 (×2): qty 1

## 2014-12-13 NOTE — Progress Notes (Signed)
Pt ambulated x3 in hallways during shift. No c/o discomfort or distress during shift. Pt stated "I feel much better today than I was yesterday". Reported off to incoming RN. Francis Gaines Ruben Pyka RN.

## 2014-12-13 NOTE — Evaluation (Signed)
Physical Therapy Evaluation Patient Details Name: [name] MRN: [id number] DOB: 1941/01/04 Today's Date: 12/13/2014   History of Present Illness  [name] is a 74 y.o. male with history of chronic atrial fibrillation, CAD, hypertension, diabetes mellitus type 2, OSA presents to the ER because of worsening diarrhea with nausea vomiting and abdominal discomfort since yesterday morning. Patient states that he was doing fine and from yesterday morning he started developing nausea vomiting and diarrhea.   Clinical Impression  Pt presents with decreased mobility and decreased balance.  Pt able to ambulate without RW at min A level, however grossly unsteady, therefore recommend use of RW which he was able to do at S level.  Recommend he use his RW at home.  Pt will not require any further follow up in hospital, as he is leaving tomorrow, therefore will defer therapy to HHPT at home.  Pt verbalized understanding.     Follow Up Recommendations Home health PT    Equipment Recommendations  None recommended by PT (already has RW)    Recommendations for Other Services       Precautions / Restrictions Precautions Precautions: Fall Restrictions Weight Bearing Restrictions: No      Mobility  Bed Mobility Overal bed mobility: Modified Independent                Transfers Overall transfer level: Needs assistance Equipment used: Rolling walker (2 wheeled) Transfers: Sit to/from Stand Sit to Stand: Supervision         General transfer comment: Pt requires use of RW for safe sit<>stand with min cues for hand placement.   Ambulation/Gait Ambulation/Gait assistance: Supervision;Min assist Ambulation Distance (Feet): 150 Feet (and another 10' w/o AD) Assistive device: Rolling walker (2 wheeled);1 person hand held assist Gait Pattern/deviations: Step-through pattern;Decreased stride length;Staggering left;Staggering right Gait velocity: decreased   General Gait Details: Pt  ambulated to doorway without AD in order to assess gait and balance.  Note he requires min A to steady and use of IV pole for stabilization.  Provided RW for remainder of gait assessment with marked improvement of balance and pt able to ambulate at S level.  Highly recommend he use his RW at home initially for safety.   Stairs Stairs: Yes Stairs assistance: Min assist Stair Management: One rail Left;Alternating pattern;Step to pattern;Sideways;Forwards Number of Stairs: 3 General stair comments: Pt able to ascend with L rail and HHA on R with min A, however descended using L rail sideways at S level.   Wheelchair Mobility    Modified Rankin (Stroke Patients Only)       Balance Overall balance assessment: Needs assistance Sitting-balance support: Feet supported Sitting balance-Leahy Scale: Good     Standing balance support: During functional activity Standing balance-Leahy Scale: Poor Standing balance comment: Pt requires use of RW to maintain dynamic standing balance.                              Pertinent Vitals/Pain Pain Assessment: 0-10 Pain Score: 3  Pain Location: LEs due to neuropathy Pain Intervention(s): Monitored during session    [place] expects to be discharged to:: Private residence Living Arrangements: Spouse/significant other Available Help at Discharge: Available 24 hours/day;Family Type of Home: House Home Access: Stairs to enter Entrance Stairs-Rails: Psychiatric nurse of Steps: 3   Home Equipment: Cane - single point      Prior Function Level of Independence: Independent  Hand Dominance        Extremity/Trunk Assessment   Upper Extremity Assessment: Overall WFL for tasks assessed           Lower Extremity Assessment: Overall WFL for tasks assessed;Generalized weakness      Cervical / Trunk Assessment: Kyphotic  Communication   Communication: No difficulties   Cognition Arousal/Alertness: Awake/alert Behavior During Therapy: WFL for tasks assessed/performed Overall Cognitive Status: Within Functional Limits for tasks assessed                      General Comments      Exercises        Assessment/Plan    PT Assessment All further PT needs can be met in the next venue of care  PT Diagnosis Difficulty walking;Generalized weakness   PT Problem List Decreased strength;Decreased activity tolerance;Decreased balance;Decreased mobility;Decreased safety awareness  PT Treatment Interventions DME instruction;Gait training;Stair training;Functional mobility training;Therapeutic activities;Therapeutic exercise;Balance training;Patient/family education   PT Goals (Current goals can be found in the Care Plan section) Acute Rehab PT Goals Patient Stated Goal:  (will defer to HHPT) PT Goal Formulation: All assessment and education complete, DC therapy Time For Goal Achievement: 12/14/14    Frequency     Barriers to discharge        Co-evaluation               End of Session   Activity Tolerance: Patient tolerated treatment well Patient left: in bed;with call bell/phone within reach Nurse Communication: Mobility status         Time: 9292-4462 PT Time Calculation (min) (ACUTE ONLY): 27 min   Charges:   PT Evaluation $Initial PT Evaluation Tier I: 1 Procedure PT Treatments $Gait Training: 23-37 mins   PT G Codes:        Denice Bors 12/13/2014, 1:54 PM

## 2014-12-13 NOTE — Progress Notes (Addendum)
Patient Demographics  [name], is a 74 y.o. male, DOB - September 17, 1941, QZE:[id number]  Admit date - 12/11/2014   Admitting Physician Rise Patience, MD  Outpatient Primary MD for the patient is Irven Shelling, MD  LOS - 1   Chief Complaint  Patient presents with  . Nausea  . Emesis  . Diarrhea        Subjective:   [name] today has, No headache, No chest pain, No abdominal pain - No Nausea, No new weakness tingling or numbness, No Cough - SOB.  Nausea vomiting and diarrhea much improved.  Assessment & Plan    1. Sepsis due to gastroenteritis. Most likely viral. C. difficile negative and complete Stool studies pending. Lactic acid levels are normal now, continue until IV hydration, advance diet if tolerates discharge in the morning.   2. DM type II. Currently on Glucotrol along with sliding scale. Monitor CBGs.  CBG (last 3)   Recent Labs  12/12/14 2358 12/13/14 0403 12/13/14 0740  GLUCAP 181* 136* 141*     3. Chronic atrial fibrillation. [place] Vasc 2 score is - 4 - Beta blocker for rate control, continue xaralto.   4. Essential hypertension. Blood pressure stable on beta blocker continue.   5. Dyslipidemia. Home dose statin.   6.OSA - CPAP daily at bedtime.   7. Urinary retention with UTI. Placed on Flomax, Rocephin, remove Foley increase activity. Monitor postvoid residual.     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 Follow with PCP closely post discharge.     Code Status: Full  Family Communication: Wife  Disposition Plan: Home   Procedures CT Abd Pelvis   Consults   None   Medications  Scheduled Meds: . atorvastatin  10 mg Oral Daily  . glimepiride  1 mg Oral Q breakfast  . insulin aspart  0-9 Units Subcutaneous Q4H  . metoprolol   50 mg Oral BID  . pantoprazole  40 mg Oral Daily  . pregabalin  75 mg Oral BID  . rivaroxaban  20 mg Oral Q supper  . sodium chloride  3 mL Intravenous Q12H  . vitamin B-12  1,000 mcg Oral Daily   Continuous Infusions: . sodium chloride 75 mL/hr at 12/13/14 0816   PRN Meds:.acetaminophen **OR** acetaminophen, ondansetron **OR** ondansetron (ZOFRAN) IV, oxyCODONE, sodium chloride  DVT Prophylaxis  Xaralto  Lab Results  Component Value Date   PLT 105* 12/13/2014    Antibiotics     Anti-infectives    None          Objective:   Filed Vitals:   12/12/14 2348 12/13/14 0208 12/13/14 0422 12/13/14 1025  BP: 127/64 122/63 120/66 116/60  Pulse: 83 73 78 91  Temp: 98.4 F (36.9 C) 98.5 F (36.9 C) 98.9 F (37.2 C) 98.4 F (36.9 C)  TempSrc: Oral Oral Oral Oral  Resp: 18 16 18 18   Height:      Weight:   98.657 kg (217 lb 8 oz)   SpO2: 96% 98% 98% 96%    Wt Readings from Last 3 Encounters:  12/13/14 98.657 kg (217 lb 8 oz)  11/15/14 94.348 kg (208 lb)  01/19/14 94.802 kg (209 lb)     Intake/Output Summary (Last 24 hours)  at 12/13/14 1041 Last data filed at 12/13/14 0824  Gross per 24 hour  Intake 3909.16 ml  Output   1925 ml  Net 1984.16 ml     Physical Exam  Awake Alert, Oriented X 3, No new F.N deficits, Normal affect [place].AT,PERRAL Supple Neck,No JVD, No cervical lymphadenopathy appriciated.  Symmetrical Chest wall movement, Good air movement bilaterally, CTAB RRR,No Gallops,Rubs or new Murmurs, No Parasternal Heave +ve B.Sounds, Abd Soft, No tenderness, No organomegaly appriciated, No rebound - guarding or rigidity. No Cyanosis, Clubbing or edema, No new Rash or bruise      Data Review   Micro Results Recent Results (from the past 240 hour(s))  Clostridium Difficile by PCR     Status: None   Collection Time: 12/11/14 11:27 PM  Result Value Ref Range Status   C difficile by pcr NEGATIVE NEGATIVE Final  Stool culture     Status: None (Preliminary  result)   Collection Time: 12/12/14  5:22 AM  Result Value Ref Range Status   Specimen Description STOOL  Final   Special Requests NONE  Final   Culture   Final    Culture reincubated for better growth Performed at [facility]    Report Status PENDING  Incomplete    Radiology Reports Ct Abdomen Pelvis W Contrast  12/12/2014   CLINICAL DATA:  Nausea, vomiting and diarrhea beginning yesterday at noon, weakness, pre syncopal. Hypotensive. Tachycardia. History of abdominal aortic aneurysm, acid reflux, incisional hernia.  EXAM: CT ABDOMEN AND PELVIS WITH CONTRAST  TECHNIQUE: Multidetector CT imaging of the abdomen and pelvis was performed using the standard protocol following bolus administration of intravenous contrast.  CONTRAST:  100 cc Omnipaque 300  COMPARISON:  MRI of the abdomen June 23, 2014 and CT of the abdomen and pelvis June 10, 2014  FINDINGS: LUNG BASES: Stable 9 mm pleural-based LEFT lower lobe nodule. Heart is mildly enlarged, trace pericardial effusion.  SOLID ORGANS: The liver, spleen, pancreas and adrenal glands are unremarkable. Status post cholecystectomy. 9 mm hypodensity and tail of the pancreas go, as reported on prior MRI. Adrenal glands are unremarkable.  GASTROINTESTINAL TRACT: The stomach, small and large bowel are normal in course and caliber without inflammatory changes. Fluid filled small large bowel. Normal appendix.  KIDNEYS/ URINARY TRACT: Kidneys are orthotopic, demonstrating symmetric enhancement. No nephrolithiasis, hydronephrosis or solid renal masses. Focal scarring LEFT lower pole coarse calcification, unchanged. 2.9 cm RIGHT interpolar cyst, previously characterized on MR. 3 mm LEFT lower pole, punctate LEFT interpolar nephrolithiasis. Too small to characterize hypodensities in kidneys bilaterally. The unopacified ureters are normal in course and caliber. Delayed imaging through the kidneys demonstrates symmetric prompt contrast excretion within the  proximal urinary collecting system. Urinary bladder is partially distended and unremarkable.  PERITONEUM/RETROPERITONEUM: 3.1 x 3.7 cm infrarenal aortic aneurysm, similar to prior examination, status post apparent aortoiliac surgical intervention. 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 No lymphadenopathy by CT size criteria. Prostate is upper limits of normal in size. No intraperitoneal free fluid nor free air.  SOFT TISSUE/OSSEOUS STRUCTURES: Non-suspicious. Small fat containing LEFT inguinal hernia. Status post L3-4 and L4-5 PLIF. Stable degenerative change of lumbar spine.  IMPRESSION: Fluid filled nondistended small and large bowel may reflect enteritis. No bowel obstruction.  Re demonstration of subcentimeter pancreatic tail cystic mass, for prior recommendation, follow-up again recommended.  Nonobstructing LEFT nephrolithiasis measure up to 3 mm.  Stable 3.1 x 3.7 cm infrarenal aortic  aneurysm, status post apparent aortoiliac surgical repair appearing Recommend followup by ultrasound in 2 years. This recommendation follows ACR consensus guidelines: White Paper of the ACR Incidental Findings Committee II on Vascular Findings. J Am Coll Radiol 2013; 10:789-794.   Electronically Signed   By: Elon Alas   On: 12/12/2014 02:29   Dg Chest Port 1 View  12/11/2014   CLINICAL DATA:  Nausea vomiting diarrhea and dyspnea  EXAM: PORTABLE CHEST - 1 VIEW  COMPARISON:  11/07/2013  FINDINGS: A single AP portable view of the chest demonstrates no focal airspace consolidation or alveolar edema. The lungs are grossly clear. There is no large effusion or pneumothorax. Cardiac and mediastinal contours appear unremarkable. There is no significant interval change.  IMPRESSION: No acute findings   Electronically Signed   By: Andreas Newport M.D.   On: 12/11/2014 23:33     CBC  Recent Labs Lab 12/11/14 2300 12/12/14 0442  12/13/14 0323  WBC 15.0* 7.9 6.7  HGB 16.1 14.8 12.8*  HCT 46.2 43.6 37.6*  PLT 150 120* 105*  MCV 90.6 91.2 91.7  MCH 31.6 31.0 31.2  MCHC 34.8 33.9 34.0  RDW 13.2 13.4 13.5  LYMPHSABS 1.6 0.9  --   MONOABS 1.8* 1.4*  --   EOSABS 0.0 0.0  --   BASOSABS 0.0 0.0  --     Chemistries   Recent Labs Lab 12/11/14 2300 12/12/14 0442 12/13/14 0323  NA 138 139 138  K 4.1 4.8 4.2  CL 103 108 110  CO2 26 22 21   GLUCOSE 203* 250* 160*  BUN 14 17 9   CREATININE 1.07 0.97 0.77  CALCIUM 10.3 9.1 8.4  MG  --   --  1.6  AST 26 28  --   ALT 28 25  --   ALKPHOS 59 48  --   BILITOT 0.9 1.2  --    ------------------------------------------------------------------------------------------------------------------ estimated creatinine clearance is 97 mL/min (by C-G formula based on Cr of 0.77). ------------------------------------------------------------------------------------------------------------------ No results for input(s): HGBA1C in the last 72 hours. ------------------------------------------------------------------------------------------------------------------ No results for input(s): CHOL, HDL, LDLCALC, TRIG, CHOLHDL, LDLDIRECT in the last 72 hours. ------------------------------------------------------------------------------------------------------------------  Recent Labs  12/12/14 0442  TSH 0.317*   ------------------------------------------------------------------------------------------------------------------ No results for input(s): VITAMINB12, FOLATE, FERRITIN, TIBC, IRON, RETICCTPCT in the last 72 hours.  Coagulation profile  Recent Labs Lab 12/11/14 2300  INR 1.06    No results for input(s): DDIMER in the last 72 hours.  Cardiac Enzymes  Recent Labs Lab 12/11/14 2300 12/12/14 0442  TROPONINI <0.03 0.03   ------------------------------------------------------------------------------------------------------------------ Invalid input(s):  POCBNP     Time Spent in minutes   35   Sylas Twombly K M.D on 12/13/2014 at 10:41 AM  Between 7am to 7pm - Pager - [phone]  After 7pm go to [email or web address] - [facility] Office  [phone]

## 2014-12-13 NOTE — Progress Notes (Signed)
Pt placed on CPAP of 7cm with nasal mask and 2L O2 bled in. Pt tolerating well and instructed to call if he needs any further assitance. RT will continue to monitor.

## 2014-12-14 LAB — GLUCOSE, CAPILLARY
Glucose-Capillary: 174 mg/dL — ABNORMAL HIGH (ref 70–99)
Glucose-Capillary: 162 mg/dL — ABNORMAL HIGH (ref 70–99)
Glucose-Capillary: 156 mg/dL — ABNORMAL HIGH (ref 70–99)

## 2014-12-14 MED ORDER — LOPERAMIDE HCL 2 MG PO CAPS: 2.0000 mg | ORAL_CAPSULE | Freq: Four times a day (QID) | ORAL | Status: DC | PRN

## 2014-12-14 MED ORDER — TAMSULOSIN HCL 0.4 MG PO CAPS: 0.4000 mg | ORAL_CAPSULE | Freq: Every day | ORAL | Status: DC

## 2014-12-14 MED ORDER — LOPERAMIDE HCL 2 MG PO CAPS
4.0000 mg | ORAL_CAPSULE | Freq: Once | ORAL | Status: AC
  Administered 2014-12-14: 4 mg via ORAL
  Filled 2014-12-14: qty 2

## 2014-12-14 MED ORDER — CEFUROXIME AXETIL 500 MG PO TABS: 500.0000 mg | ORAL_TABLET | Freq: Two times a day (BID) | ORAL | Status: DC

## 2014-12-14 NOTE — Plan of Care (Signed)
Problem: Phase I Progression Outcomes Goal: OOB as tolerated unless otherwise ordered Outcome: Completed/Met Date Met:  12/14/14 Patient up to [facility] and bathroom with one assist Goal: Voiding-avoid urinary catheter unless indicated Outcome: Completed/Met Date Met:  12/14/14 Urinary catheter discontinued  Problem: Phase II Progression Outcomes Goal: Progress activity as tolerated unless otherwise ordered Outcome: Completed/Met Date Met:  12/14/14 Patient ambulated in hall with RN

## 2014-12-14 NOTE — Discharge Summary (Signed)
[name], is a 74 y.o. male  DOB July 23, 1941  MRN [id number].  Admission date:  12/11/2014  Admitting Physician  Rise Patience, MD  Discharge Date:  12/14/2014   Primary MD  Irven Shelling, MD  Recommendations for primary care physician for things to follow:   Follow final stool culture results.  Needs 1 time outpatient, GI, Vascular surgery and urology follow-up based on incidental findings on CT scan.   Admission Diagnosis  Diarrhea [R19.7] Hypotensive episode [I95.9] Abdominal pain, unspecified abdominal location [R10.9]   Discharge Diagnosis  Diarrhea [R19.7] Hypotensive episode [I95.9] Abdominal pain, unspecified abdominal location [R10.9]     Principal Problem:   SIRS (systemic inflammatory response syndrome) Active Problems:   Abdominal pain   Nausea vomiting and diarrhea   Chronic atrial fibrillation   Diabetes mellitus type 2, controlled   Hyperlipidemia      Past Medical History  Diagnosis Date  . Hypercholesteremia     takes lipitor daily  . AAA (abdominal aortic aneurysm)   . Pain     back pain chronic- seen at pain clinic  . Neuropathy     in legs  . Bronchitis     hx of  . Ileus   . Arthritis     BacK  . Incisional hernia     "small, from AAA"  . Coronary atherosclerosis of native coronary artery     ejection fraction of 40%, prior inferior myocardial infarction in February 1993, PTCA, LAD rotational atherectomy in August of 1998, acute inferolateral myocardial infarction, ventricular fibrillation in July of 1999, PTCA and stent of obtuse marginal 1, 100% RCA 1/12 with collaterals from patent circumflex and LAD  . Hypertension     takes Ramipril daily  . MI (myocardial infarction)     1992 and 1997 sees Dr. Linard Millers as needed   . Dysrhythmia     "got out of  rhythym a couple , but it straightened back out."  . Sleep apnea     uses CPAP--sleep study done at least 32yrs ago  . Chronic back pain   . Dry skin   . Acid reflux     takes Prilosec and Protonix daily  . Diabetes mellitus     takes Metformin daily  . Complication of anesthesia     -years ago hair fell out.;ileus after 2 of his surgeries    Past Surgical History  Procedure Laterality Date  . Abdominal surgery    . Abdominal aortic aneurysm repair    . Colonoscopy    . Cholecystectomy    . Back surgery  2013    Miminal Invasive x2 in [place].  . Cardiac catheterization  2009/2012  . Coronary angioplasty      2 stents  . Coronary stent placement         History of present illness and  Hospital Course:     Kindly see H&P for history of present illness and admission details, please review complete Labs, Consult reports and Test reports for  all details in brief  HPI  from the history and physical done on the day of admission   [name] is a 74 y.o. male with history of chronic atrial fibrillation, CAD, hypertension, diabetes mellitus type 2, OSA presents to the ER because of worsening diarrhea with nausea vomiting and abdominal discomfort since yesterday morning. Patient states that he was doing fine and from yesterday morning he started developing nausea vomiting and diarrhea. Denies any recent travel or sick contacts. He denies recently using any antibiotics. Patient was recently started on Lyrica for his neuropathy last week. CT abdomen and pelvis done in the ER shows fluid-filled bowels which may reflect enteritis. Patient has had at least 4 bowel movements in the ER which was loose and nonbloody. Patient has been admitted for further management of his gastroenteritis. Initially in the ER patient was tachycardic and mildly hypotensive which improved with 2 L normal seen bolus. Patient denies any chest pain or shortness of breath productive cough fever chills.   Hospital  Course    1. Sepsis due to gastroenteritis. Most likely viral. C. difficile negative and complete Stool studies pending. Lactic acid levels are normal now, dose to baseline after hydration, will place on as needed Imodium and discharge .   2. DM type II. Continue home regimen.  CBG (last 3)   Recent Labs (last 2 labs)      Recent Labs  12/12/14 2358 12/13/14 0403 12/13/14 0740  GLUCAP 181* 136* 141*       3. Chronic atrial fibrillation. [place] Vasc 2 score is - 4 - Beta blocker for rate control, continue xaralto.   4. Essential hypertension. Blood pressure stable on beta blocker continue.   5. Dyslipidemia. Home dose statin.   6.OSA - CPAP daily at bedtime.   7. Urinary retention with UTI. Placed on Flomax, Rocephin will be transitioned to Ceftin, removed Foley increased activity. Voiding well. One time outpatient urology follow-up recommended.     Findings of AAA, Kidney Stone and pancreatic head cyst. Follow with PCP closely post discharge.        Discharge Condition: Stable   Follow UP  Follow-up Information    Follow up with Irven Shelling, MD. Schedule an appointment as soon as possible for a visit in 1 week.   Specialty:  Internal Medicine   Why:  And your stomach doctor for pancreatic cyst   Contact information:   [address], [place] [address] [place] [address] [phone]       Follow up with Alexis Frock, MD. Schedule an appointment as soon as possible for a visit in 1 week.   Specialty:  Urology   Why:  Urinary retention, UTI   Contact information:   [place] [address] [phone]       Follow up with EARLY, TODD, MD. Schedule an appointment as soon as possible for a visit in 1 week.   Specialty:  Vascular Surgery   Why:  AAA   Contact information:   [address] [place] [address] [phone]         Discharge Instructions  and  Discharge Medications          Discharge  Instructions    Discharge instructions    Complete by:  As directed   Follow with Primary MD Irven Shelling, MD in 7 days   Get CBC, CMP, 2 view Chest X ray checked  by Primary MD next visit.    Activity: As  tolerated with Full fall precautions use walker/cane & assistance as needed   Disposition Home     Diet: Heart Healthy Low Carb.  For Heart failure patients - Check your Weight same time everyday, if you gain over 2 pounds, or you develop in leg swelling, experience more shortness of breath or chest pain, call your Primary MD immediately. Follow Cardiac Low Salt Diet and 1.5 lit/day fluid restriction.   On your next visit with your primary care physician please Get Medicines reviewed and adjusted.   Please request your Prim.MD to go over all Hospital Tests and Procedure/Radiological results at the follow up, please get all Hospital records sent to your Prim MD by signing hospital release before you go home.   If you experience worsening of your admission symptoms, develop shortness of breath, life threatening emergency, suicidal or homicidal thoughts you must seek medical attention immediately by calling 911 or calling your MD immediately  if symptoms less severe.  You Must read complete instructions/literature along with all the possible adverse reactions/side effects for all the Medicines you take and that have been prescribed to you. Take any new Medicines after you have completely understood and accpet all the possible adverse reactions/side effects.   Do not drive, operating heavy machinery, perform activities at heights, swimming or participation in water activities or provide baby sitting services if your were admitted for syncope or siezures until you have seen by Primary MD or a Neurologist and advised to do so again.  Do not drive when taking Pain medications.    Do not take more than prescribed Pain, Sleep and Anxiety Medications  Special Instructions: If you  have smoked or chewed Tobacco  in the last 2 yrs please stop smoking, stop any regular Alcohol  and or any Recreational drug use.  Wear Seat belts while driving.   Please note  You were cared for by a hospitalist during your hospital stay. If you have any questions about your discharge medications or the care you received while you were in the hospital after you are discharged, you can call the unit and asked to speak with the hospitalist on call if the hospitalist that took care of you is not available. Once you are discharged, your primary care physician will handle any further medical issues. Please note that NO REFILLS for any discharge medications will be authorized once you are discharged, as it is imperative that you return to your primary care physician (or establish a relationship with a primary care physician if you do not have one) for your aftercare needs so that they can reassess your need for medications and monitor your lab values.     Increase activity slowly    Complete by:  As directed             Medication List    TAKE these medications        ACCU-CHEK FASTCLIX LANCETS Misc     ACCU-CHEK SMARTVIEW test strip  Generic drug:  glucose blood     atorvastatin 10 MG tablet  Commonly known as:  LIPITOR  Take 10 mg by mouth daily.     cefUROXime 500 MG tablet  Commonly known as:  CEFTIN  Take 1 tablet (500 mg total) by mouth 2 (two) times daily with a meal.     Co Q 10 100 MG Caps  Take 100 mg by mouth daily.     finasteride 1 MG tablet  Commonly known as:  PROPECIA  Take 1  mg by mouth daily.     glimepiride 1 MG tablet  Commonly known as:  AMARYL     loperamide 2 MG capsule  Commonly known as:  IMODIUM  Take 1 capsule (2 mg total) by mouth every 6 (six) hours as needed for diarrhea or loose stools.     metFORMIN 1000 MG tablet  Commonly known as:  GLUCOPHAGE  Take 1,000 mg by mouth 2 (two) times daily with a meal.     metoprolol 50 MG tablet  Commonly  known as:  LOPRESSOR  TAKE ONE TABLET BY MOUTH TWICE DAILY     multivitamin with minerals Tabs tablet  Take 1 tablet by mouth daily.     omeprazole 20 MG capsule  Commonly known as:  PRILOSEC  Take 20 mg by mouth daily.     pregabalin 75 MG capsule  Commonly known as:  LYRICA  Take 75 mg by mouth 2 (two) times daily.     rivaroxaban 20 MG Tabs tablet  Commonly known as:  XARELTO  Take 1 tablet (20 mg total) by mouth daily with supper.     tamsulosin 0.4 MG Caps capsule  Commonly known as:  FLOMAX  Take 1 capsule (0.4 mg total) by mouth daily.     vitamin B-12 1000 MCG tablet  Commonly known as:  CYANOCOBALAMIN  Take 1,000 mcg by mouth daily.          Diet and Activity recommendation: See Discharge Instructions above   Consults obtained - none   Major procedures and Radiology Reports - PLEASE review detailed and final reports for all details, in brief -       Ct Abdomen Pelvis W Contrast  12/12/2014   CLINICAL DATA:  Nausea, vomiting and diarrhea beginning yesterday at noon, weakness, pre syncopal. Hypotensive. Tachycardia. History of abdominal aortic aneurysm, acid reflux, incisional hernia.  EXAM: CT ABDOMEN AND PELVIS WITH CONTRAST  TECHNIQUE: Multidetector CT imaging of the abdomen and pelvis was performed using the standard protocol following bolus administration of intravenous contrast.  CONTRAST:  100 cc Omnipaque 300  COMPARISON:  MRI of the abdomen June 23, 2014 and CT of the abdomen and pelvis June 10, 2014  FINDINGS: LUNG BASES: Stable 9 mm pleural-based LEFT lower lobe nodule. Heart is mildly enlarged, trace pericardial effusion.  SOLID ORGANS: The liver, spleen, pancreas and adrenal glands are unremarkable. Status post cholecystectomy. 9 mm hypodensity and tail of the pancreas go, as reported on prior MRI. Adrenal glands are unremarkable.  GASTROINTESTINAL TRACT: The stomach, small and large bowel are normal in course and caliber without inflammatory  changes. Fluid filled small large bowel. Normal appendix.  KIDNEYS/ URINARY TRACT: Kidneys are orthotopic, demonstrating symmetric enhancement. No nephrolithiasis, hydronephrosis or solid renal masses. Focal scarring LEFT lower pole coarse calcification, unchanged. 2.9 cm RIGHT interpolar cyst, previously characterized on MR. 3 mm LEFT lower pole, punctate LEFT interpolar nephrolithiasis. Too small to characterize hypodensities in kidneys bilaterally. The unopacified ureters are normal in course and caliber. Delayed imaging through the kidneys demonstrates symmetric prompt contrast excretion within the proximal urinary collecting system. Urinary bladder is partially distended and unremarkable.  PERITONEUM/RETROPERITONEUM: 3.1 x 3.7 cm infrarenal aortic aneurysm, similar to prior examination, status post apparent aortoiliac surgical intervention. 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 No lymphadenopathy by CT size criteria. Prostate is upper limits of normal in size. No intraperitoneal free fluid nor free air.  SOFT TISSUE/OSSEOUS STRUCTURES:  Non-suspicious. Small fat containing LEFT inguinal hernia. Status post L3-4 and L4-5 PLIF. Stable degenerative change of lumbar spine.  IMPRESSION: Fluid filled nondistended small and large bowel may reflect enteritis. No bowel obstruction.  Re demonstration of subcentimeter pancreatic tail cystic mass, for prior recommendation, follow-up again recommended.  Nonobstructing LEFT nephrolithiasis measure up to 3 mm.  Stable 3.1 x 3.7 cm infrarenal aortic aneurysm, status post apparent aortoiliac surgical repair appearing Recommend followup by ultrasound in 2 years. This recommendation follows ACR consensus guidelines: White Paper of the ACR Incidental Findings Committee II on Vascular Findings. J Am Coll Radiol 2013; 10:789-794.   Electronically Signed   By: Elon Alas   On:  12/12/2014 02:29   Dg Chest Port 1 View  12/11/2014   CLINICAL DATA:  Nausea vomiting diarrhea and dyspnea  EXAM: PORTABLE CHEST - 1 VIEW  COMPARISON:  11/07/2013  FINDINGS: A single AP portable view of the chest demonstrates no focal airspace consolidation or alveolar edema. The lungs are grossly clear. There is no large effusion or pneumothorax. Cardiac and mediastinal contours appear unremarkable. There is no significant interval change.  IMPRESSION: No acute findings   Electronically Signed   By: Andreas Newport M.D.   On: 12/11/2014 23:33    Micro Results      Recent Results (from the past 240 hour(s))  Clostridium Difficile by PCR     Status: None   Collection Time: 12/11/14 11:27 PM  Result Value Ref Range Status   C difficile by pcr NEGATIVE NEGATIVE Final  Stool culture     Status: None (Preliminary result)   Collection Time: 12/12/14  5:22 AM  Result Value Ref Range Status   Specimen Description STOOL  Final   Special Requests NONE  Final   Culture   Final    NO SUSPICIOUS COLONIES, CONTINUING TO HOLD Performed at [facility]    Report Status PENDING  Incomplete       Today   Subjective:   [name] today has no headache,no chest abdominal pain,no new weakness tingling or numbness, feels much better wants to go home today.   Objective:   Blood pressure 110/82, pulse 90, temperature 98 F (36.7 C), temperature source Oral, resp. rate 18, height 6' (1.829 m), weight 98.523 kg (217 lb 3.3 oz), SpO2 98 %.   Intake/Output Summary (Last 24 hours) at 12/14/14 0947 Last data filed at 12/14/14 0842  Gross per 24 hour  Intake 4146.75 ml  Output   2675 ml  Net 1471.75 ml    Exam Awake Alert, Oriented x 3, No new F.N deficits, Normal affect [place].AT,PERRAL Supple Neck,No JVD, No cervical lymphadenopathy appriciated.  Symmetrical Chest wall movement, Good air movement bilaterally, CTAB RRR,No Gallops,Rubs or new Murmurs, No Parasternal Heave +ve B.Sounds,  Abd Soft, Non tender, No organomegaly appriciated, No rebound -guarding or rigidity. No Cyanosis, Clubbing or edema, No new Rash or bruise  Data Review   CBC w Diff:  Lab Results  Component Value Date   WBC 6.7 12/13/2014   HGB 12.8* 12/13/2014   HCT 37.6* 12/13/2014   PLT 105* 12/13/2014   LYMPHOPCT 11* 12/12/2014   MONOPCT 18* 12/12/2014   EOSPCT 0 12/12/2014   BASOPCT 0 12/12/2014    CMP:  Lab Results  Component Value Date   NA 138 12/13/2014   K 4.2 12/13/2014   CL 110 12/13/2014   CO2 21 12/13/2014   BUN 9 12/13/2014   CREATININE 0.77 12/13/2014   PROT  6.5 12/12/2014   ALBUMIN 3.5 12/12/2014   BILITOT 1.2 12/12/2014   ALKPHOS 48 12/12/2014   AST 28 12/12/2014   ALT 25 12/12/2014  .   Total Time in preparing paper work, data evaluation and todays exam - 35 minutes  Thurnell Lose M.D on 12/14/2014 at 9:47 AM  [facility] Office  [phone]

## 2014-12-14 NOTE — Discharge Instructions (Signed)
Follow with Primary MD Irven Shelling, MD in 7 days   Get CBC, CMP, 2 view Chest X ray checked  by Primary MD next visit.    Activity: As tolerated with Full fall precautions use walker/cane & assistance as needed   Disposition Home     Diet: Heart Healthy Low Carb.  For Heart failure patients - Check your Weight same time everyday, if you gain over 2 pounds, or you develop in leg swelling, experience more shortness of breath or chest pain, call your Primary MD immediately. Follow Cardiac Low Salt Diet and 1.5 lit/day fluid restriction.   On your next visit with your primary care physician please Get Medicines reviewed and adjusted.   Please request your Prim.MD to go over all Hospital Tests and Procedure/Radiological results at the follow up, please get all Hospital records sent to your Prim MD by signing hospital release before you go home.   If you experience worsening of your admission symptoms, develop shortness of breath, life threatening emergency, suicidal or homicidal thoughts you must seek medical attention immediately by calling 911 or calling your MD immediately  if symptoms less severe.  You Must read complete instructions/literature along with all the possible adverse reactions/side effects for all the Medicines you take and that have been prescribed to you. Take any new Medicines after you have completely understood and accpet all the possible adverse reactions/side effects.   Do not drive, operating heavy machinery, perform activities at heights, swimming or participation in water activities or provide baby sitting services if your were admitted for syncope or siezures until you have seen by Primary MD or a Neurologist and advised to do so again.  Do not drive when taking Pain medications.    Do not take more than prescribed Pain, Sleep and Anxiety Medications  Special Instructions: If you have smoked or chewed Tobacco  in the last 2 yrs please stop smoking, stop  any regular Alcohol  and or any Recreational drug use.  Wear Seat belts while driving.   Please note  You were cared for by a hospitalist during your hospital stay. If you have any questions about your discharge medications or the care you received while you were in the hospital after you are discharged, you can call the unit and asked to speak with the hospitalist on call if the hospitalist that took care of you is not available. Once you are discharged, your primary care physician will handle any further medical issues. Please note that NO REFILLS for any discharge medications will be authorized once you are discharged, as it is imperative that you return to your primary care physician (or establish a relationship with a primary care physician if you do not have one) for your aftercare needs so that they can reassess your need for medications and monitor your lab values.

## 2014-12-14 NOTE — Progress Notes (Signed)
Pt A&O x4; pt discharge education and instructions completed with pt and spouse at bedside. All voices understanding and denies any questions. Pt IV and telemetry removed; pt handed his prescriptions for Flomax, Ceftin, and Imodium. Pt discharge home with spouse to transport him home. Pt to be transported off unit via wheelchair with belongings and spouse at side. Francis Gaines Dorothia Passmore RN.

## 2014-12-16 LAB — STOOL CULTURE

## 2014-12-18 IMAGING — RF DG MYELOGRAM LUMBAR
9 series · 9 of 9 positions shown · IV contrast (omnipaque)
Comparison: [HOSPITAL] Neurosurgery lumbar radiographs 04/21/2013
and earlier.

CLINICAL DATA: 72-year-old male with pain radiating to the right
hip and lower extremity.  Bilateral foot numbness right greater
than left.
TECHNIQUE: Lumbar puncture and intrathecal contrast administration
were performed by Dr. Yamauchi Genova who will separately report for
this portion of the procedure.  I personally supervised acquisition
of the myelogram images. Following injection of intrathecal
Omnipaque contrast, spine imaging in multiple projections was
performed using fluoroscopy.

Fluoroscopy Time: 1 minute and 17 seconds.
TECHNIQUE: CT imaging of the lumbar spine was performed after
intrathecal contrast administration.  Multiplanar CT image
reconstructions were also generated.

[Series 1: run · 1 of 1 slices shown (1 of 9)]
[im 1/1]
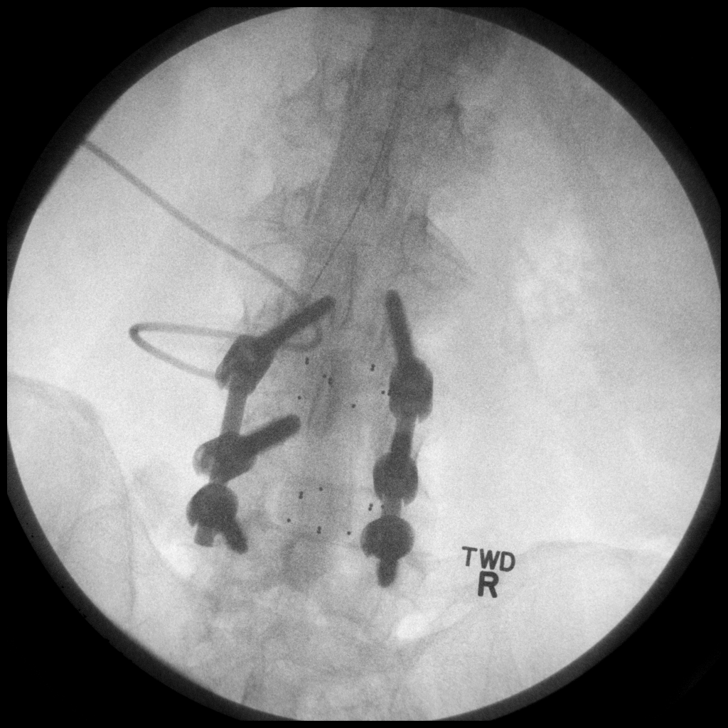

[Series 2: run · 1 of 1 slices shown (2 of 9)]
[im 1/1]
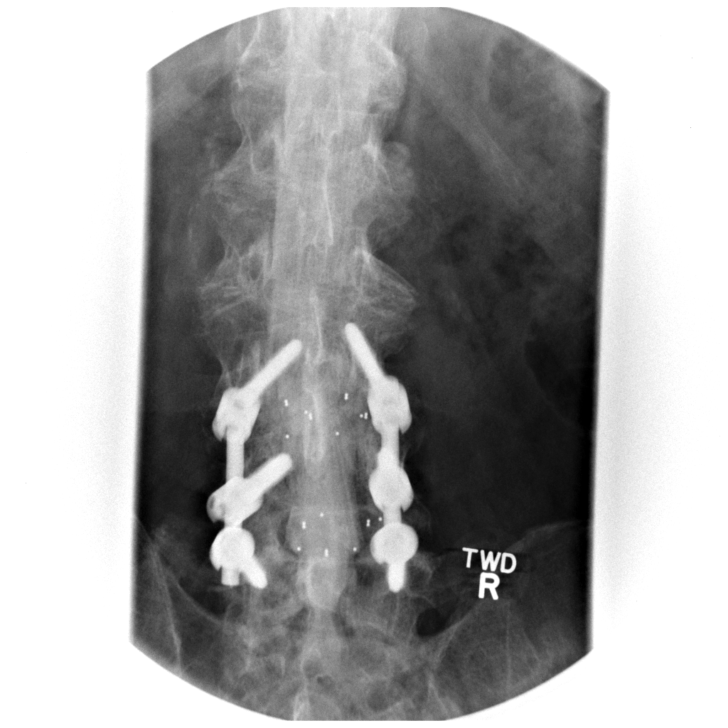

[Series 3: run · 1 of 1 slices shown (3 of 9)]
[im 1/1]
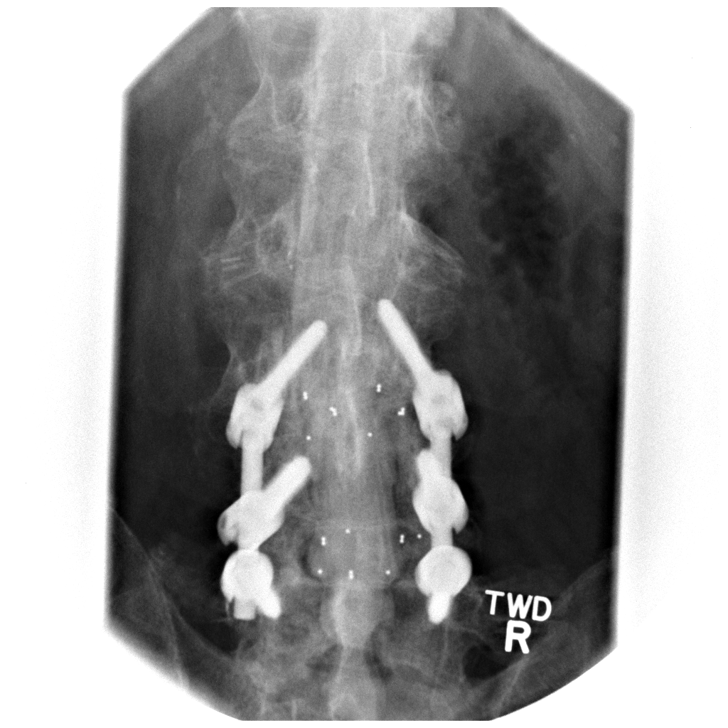

[Series 4: run · 1 of 1 slices shown (4 of 9)]
[im 1/1]
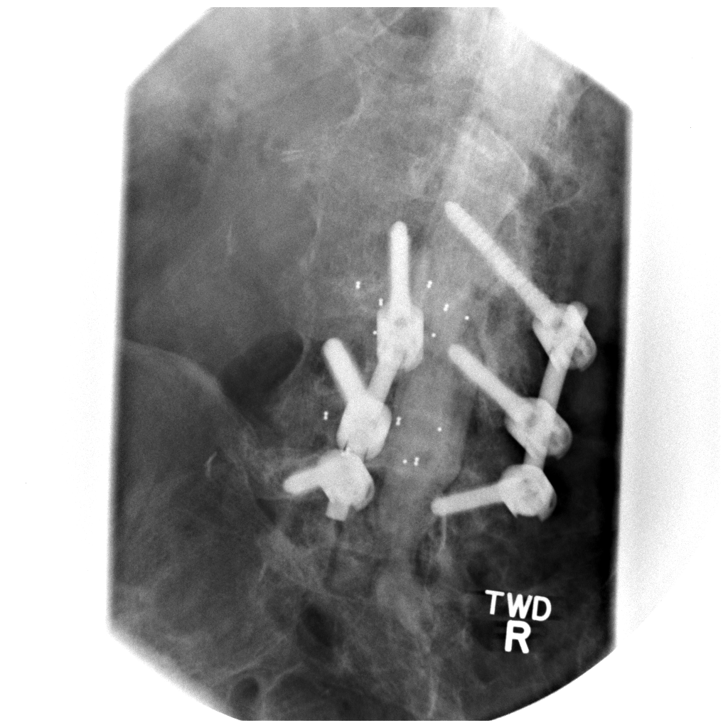

[Series 5: run · 1 of 1 slices shown (5 of 9)]
[im 1/1]
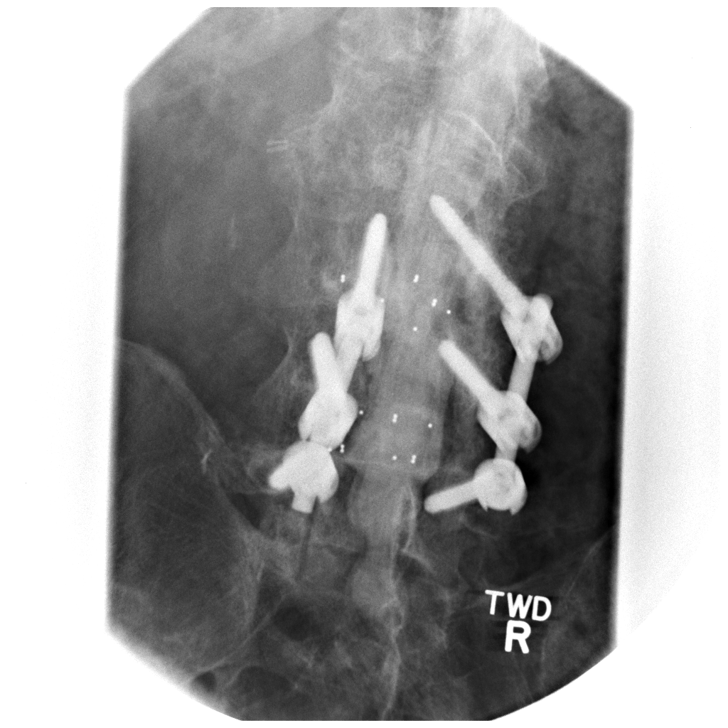

[Series 6: run · 1 of 1 slices shown (6 of 9)]
[im 1/1]
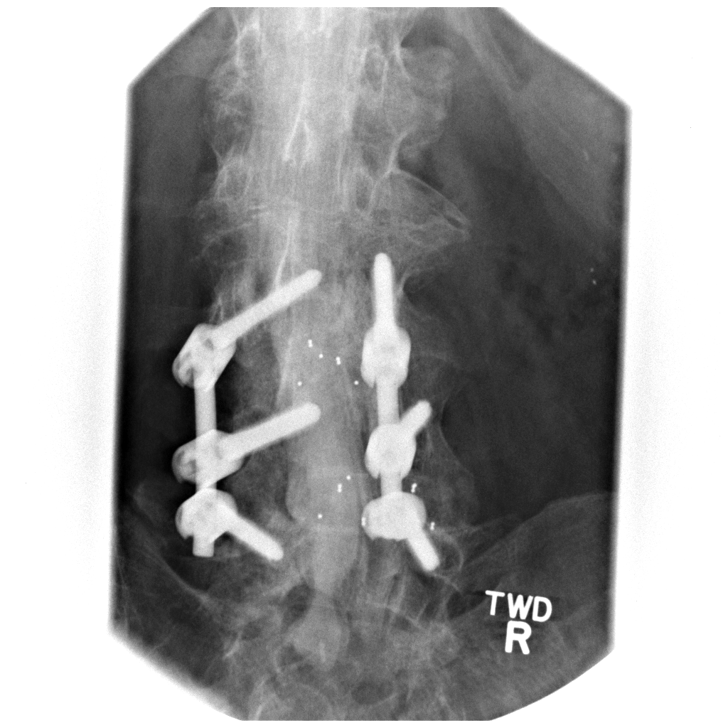

[Series 7: run · 1 of 1 slices shown (7 of 9)]
[im 1/1]
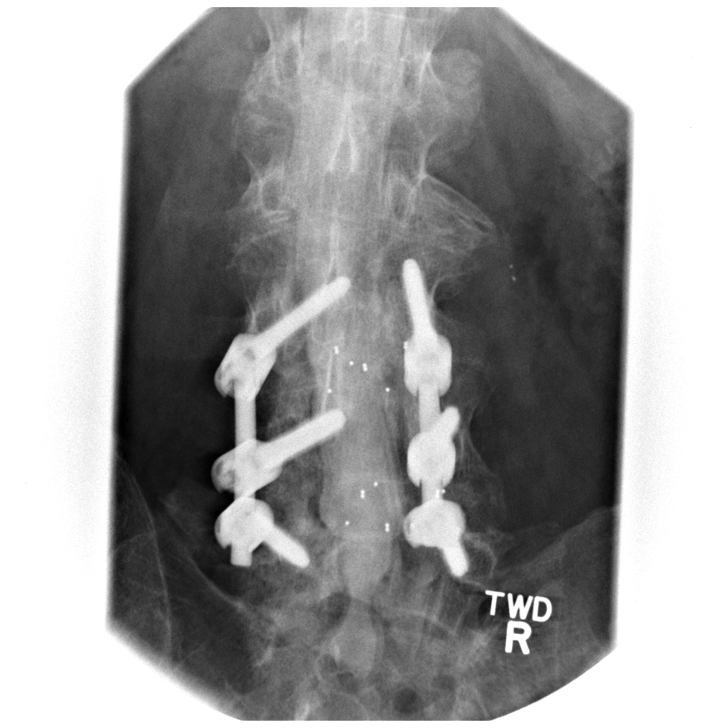

[Series 8: run · 1 of 1 slices shown (8 of 9)]
[im 1/1]
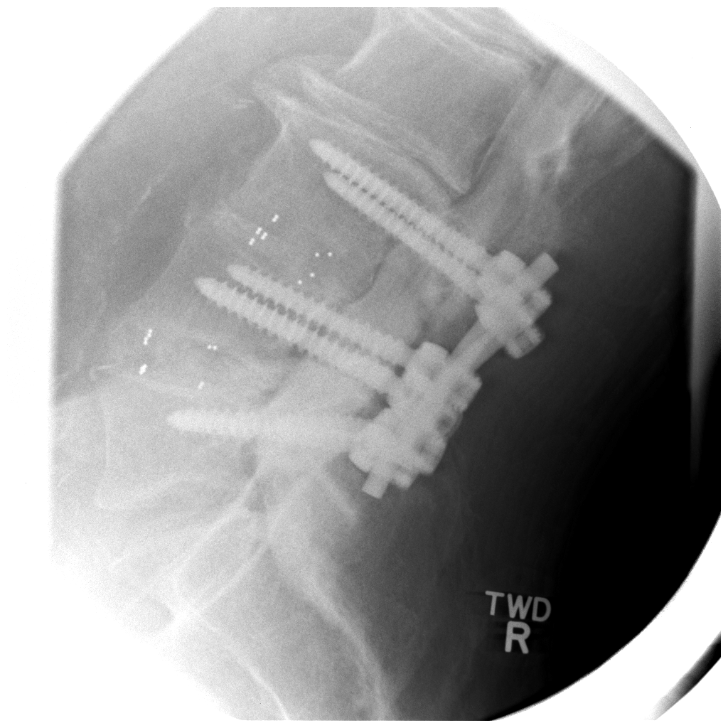

[Series 9: run · 1 of 1 slices shown (9 of 9)]
[im 1/1]
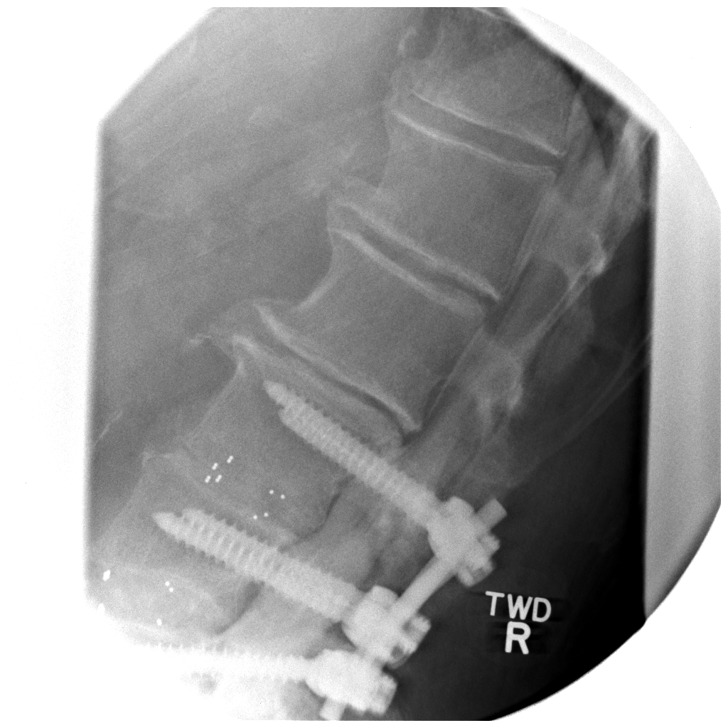

[9 of 9 positions shown; findings below may reference images not displayed]

Intraoperative radiographs 10/13/2012.  CT lumbar
spine 09/22/2012.  Lumbar MRI 02/28/2012.  [HOSPITAL] CT
abdomen and pelvis 09/09/2011.

MYELOGRAM LUMBAR
FINDINGS: L3-L4 and L4-L5 sequelae of posterior and interbody
fusion.  Hardware appears stable since 04/21/2013.  Vertebral
height and alignment appears stable.  Good intrathecal contrast
opacification.  Defect on the ventral thecal sac at L2-L3.  Severe
disc space loss and endplate spurring re-identified at this level.
Mass effect also on the thecal sac L5-S1.
IMPRESSION: 1.  Stable postoperative hardware at L3-L4 and L4-L5.
2.  Suspect degrees of spinal stenosis at L2-L3 and L5-S1.
3. See post myelogram CT findings below.

CT MYELOGRAPHY LUMBAR SPINE
FINDINGS: Stable visualized abdominal viscera.  Evidence of
previous surgical repair of an abdominal aortic aneurysm.

Postoperative changes to the posterior paraspinal soft tissues and
no adverse features identified.

Visible sacrum and SI joints within normal limits.

Since 09/22/2012, the transpedicular hardware has been revised.
See details below.  Overall stable lumbar vertebral height and
alignment since that time.

The normal myelographic appearance of the conus medullaris, tip at
T12-L1.

T12-L1:  Stable mild facet hypertrophy.  No stenosis.

L1-L2:  Stable severe disc space loss and vacuum disc phenomena.
Stable mild and mostly anterior eccentric circumferential disc
osteophyte complex.  No stenosis.

L2-L3:  Chronic severe disc space loss with vacuum disc phenomenon.
Chronic moderate circumferential disc osteophyte complex.  Mild
facet and ligament flavum hypertrophy greater on the right is
stable.  Mild right lateral recess stenosis without significant
spinal or foraminal stenosis.

L3-L4:  Interval revision of transpedicular hardware.  Currently,
the left L3 transpedicular screw traverses the left lateral recess
(course of the descending left L3 nerve roots).  In part this may
be due to a medial trajectory to  all voided the previous left
pedicle screw course (see coronal image 27).  No evidence of
hardware loosening.  The L4 transpedicular hardware is stable.  No
evidence of hardware loosening.  Partial decompression of this
level.  Interbody implant.  A small volume of interbody arthrodesis
is suspected (coronal image 19).  Also there appears to be
developing posterior element arthrodesis on the right. Residual
posterior element hypertrophy greater on the right.  At the disc
space level there is no significant spinal stenosis. No significant
foraminal stenosis.

L4-L5:  Stable hardware without evidence of loosening.  Evidence of
developing interbody arthrodesis (coronal image 11 - 13).
Developing left side posterior element arthrodesis.  Sequelae of
decompression.  The superior aspect of the left L4 neural foramen
is abutted by the left L4 pedicle screw, but overall there is no
stenosis suspected at the level.

L5-S1:  Mild chronic right eccentric broad-based disc osteophyte
complex.  Chronic moderate to severe facet hypertrophy, greater on
the left.  Left facet subchondral cysts have increased.  Lateral
mass effect on the thecal sac.  Mild spinal stenosis.  Mild right
lateral recess stenosis.  Mild left greater than right osseous
foraminal stenosis.
IMPRESSION: 1.  Revised transpedicular hardware at the L3 level.  The left
pedicle screw might affect the descending left L3 nerve roots..
2.  Improved appearance of developing interbody and posterior
element arthrodesis at both L3-L4 and L4-L5.
3.  Adjacent segment disease at L2-L3 with mild right lateral
recess stenosis.
4.  Adjacent segment disease at L5-S1 (chiefly facet arthropathy)
with mild spinal stenosis, right lateral recess stenosis, and left
greater than right L5 foraminal stenosis.

## 2014-12-18 IMAGING — CT CT L SPINE W/ CM
4 of 6 series · 14 of 33 positions shown, 16 images · IV contrast (omnipaque)
Comparison: [HOSPITAL] Neurosurgery lumbar radiographs 04/21/2013
and earlier.

CLINICAL DATA: 72-year-old male with pain radiating to the right
hip and lower extremity.  Bilateral foot numbness right greater
than left.
TECHNIQUE: Lumbar puncture and intrathecal contrast administration
were performed by Dr. Yamauchi Genova who will separately report for
this portion of the procedure.  I personally supervised acquisition
of the myelogram images. Following injection of intrathecal
Omnipaque contrast, spine imaging in multiple projections was
performed using fluoroscopy.

Fluoroscopy Time: 1 minute and 17 seconds.
TECHNIQUE: CT imaging of the lumbar spine was performed after
intrathecal contrast administration.  Multiplanar CT image
reconstructions were also generated.

[Series 4: o-mar, soft tissue · axial · 0.44mm/px · z∈[-268,-164]mm · 3 of 106 slices shown, 4 images]
[im 27/106  soft-tissue]
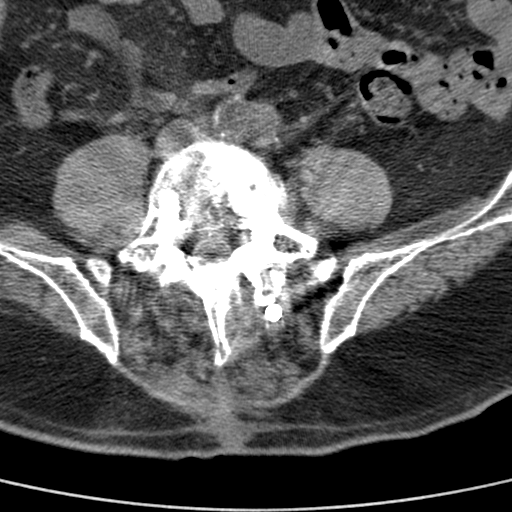
[im 27/106  bone]
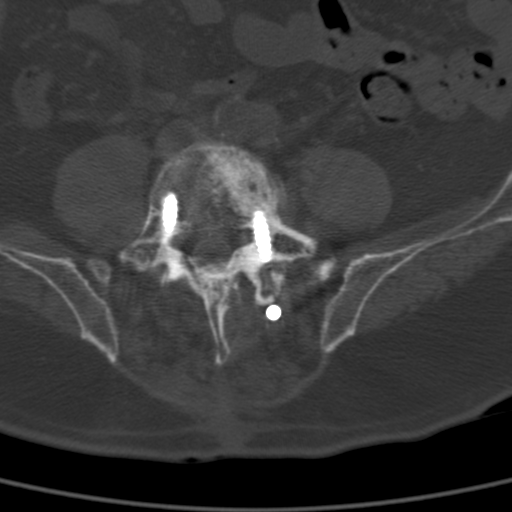
[im 53/106  bone]
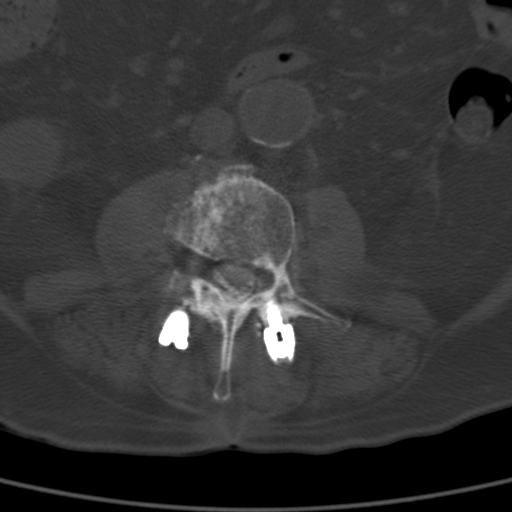
[im 79/106  bone]
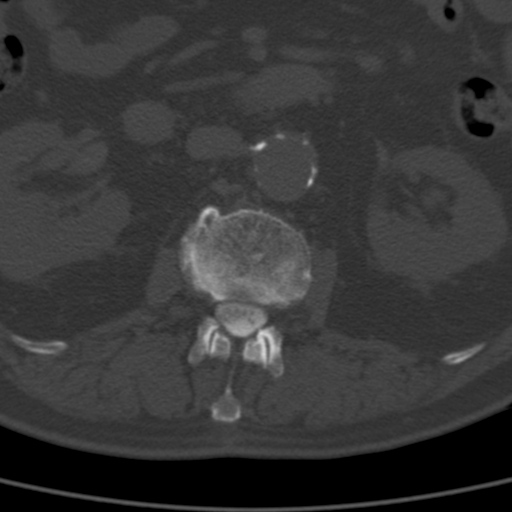

[Series 5: soft tissue · axial · 0.44mm/px · z∈[-268,-164]mm · 3 of 106 slices shown]
[im 27/106  soft-tissue]
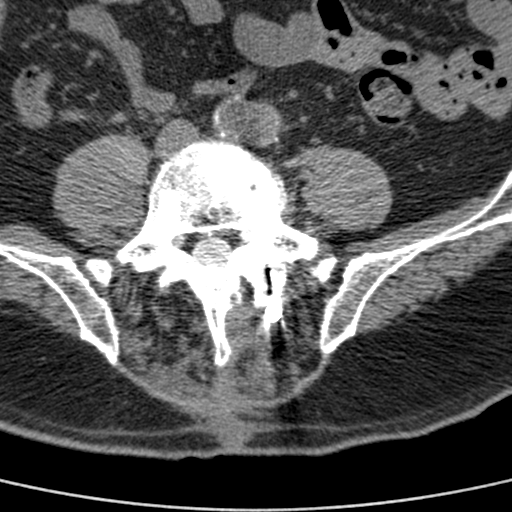
[im 53/106  soft-tissue]
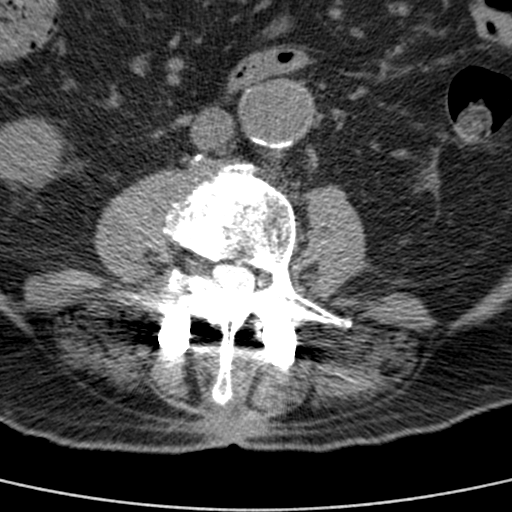
[im 79/106  soft-tissue]
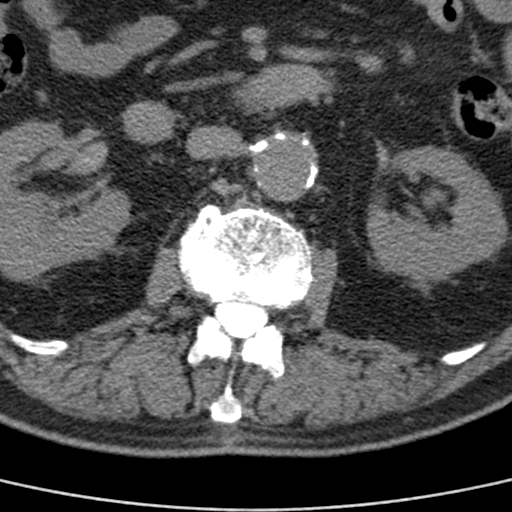

[o-mar, st coronals · coronal · 0.44mm/px · 3 of 64 slices shown]
[im 13/64  bone]
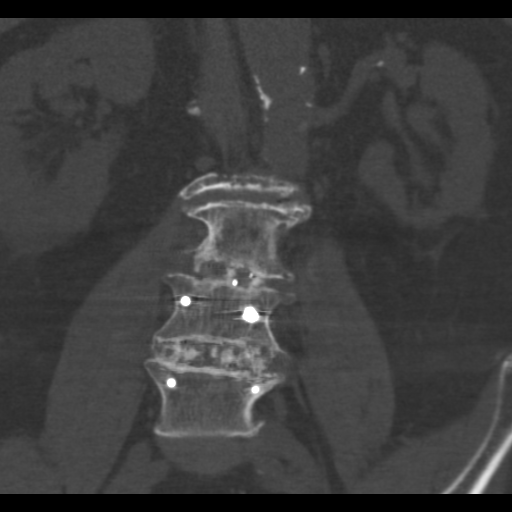
[im 26/64  bone]
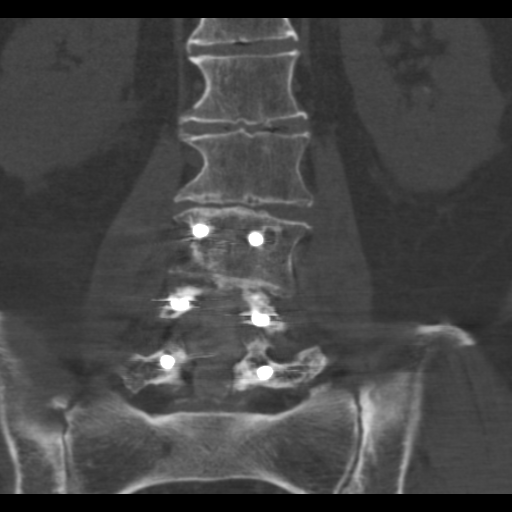
[im 38/64  bone]
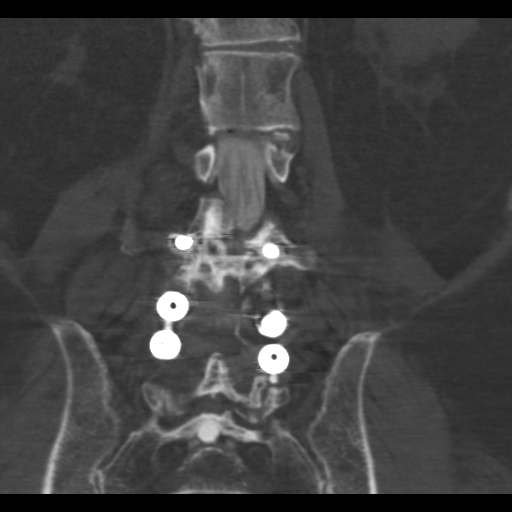

[o-mar, st sagittals · sagittal · 0.44mm/px · 5 of 63 slices shown, 6 images]
[im 21/63  bone]
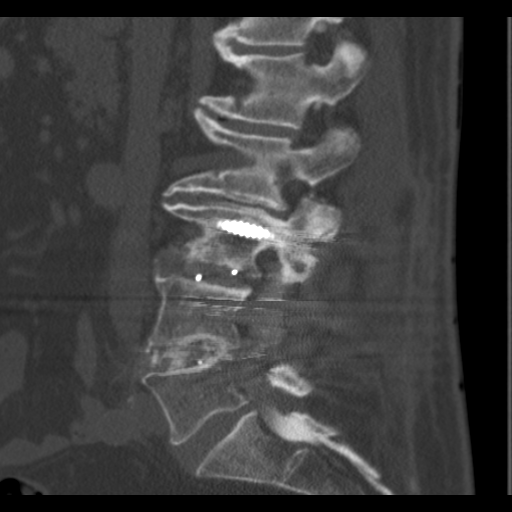
[im 26/63  bone]
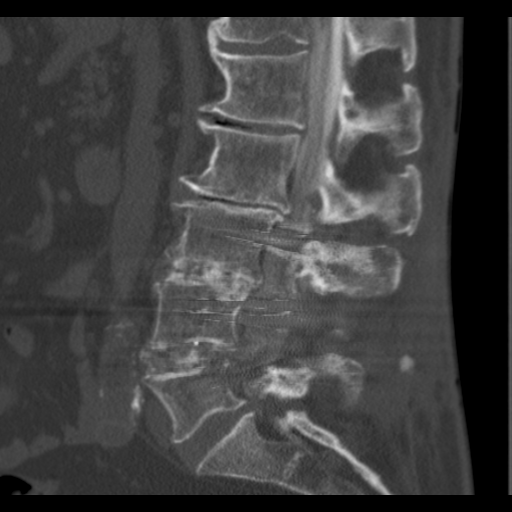
[im 32/63  soft-tissue]
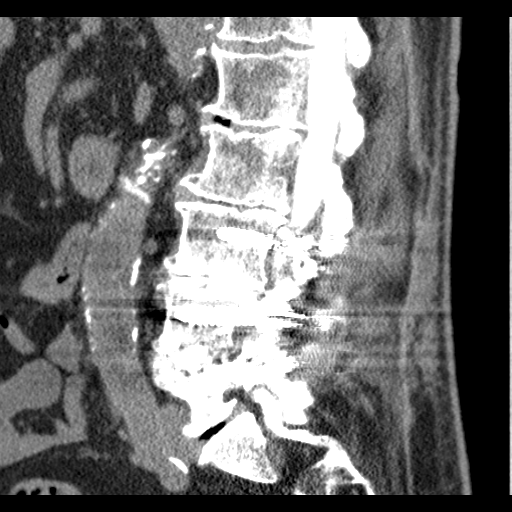
[im 32/63  bone]
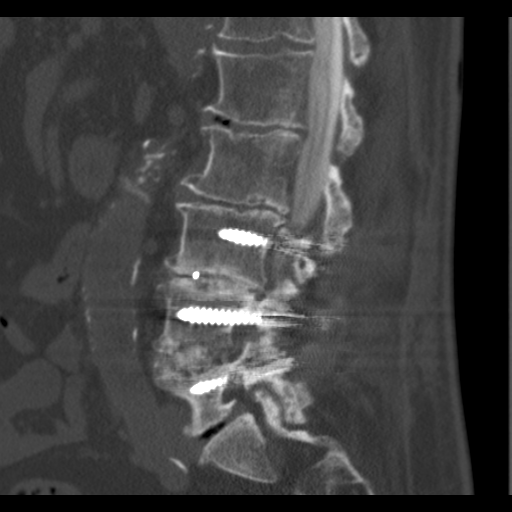
[im 37/63  bone]
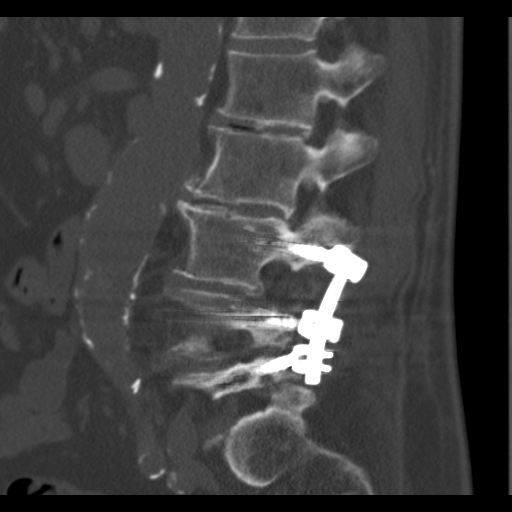
[im 42/63  bone]
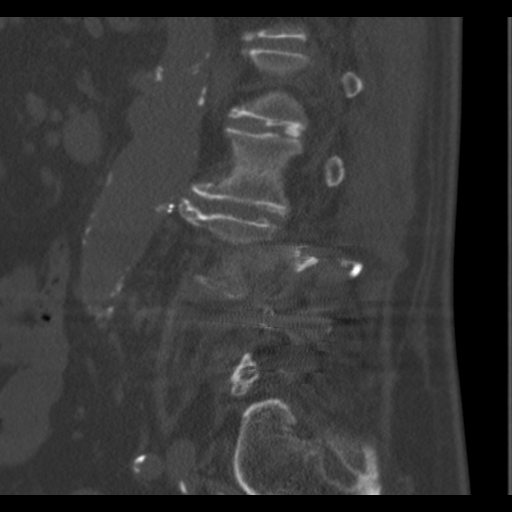

[14 of 33 positions shown; findings below may reference images not displayed]

Intraoperative radiographs 10/13/2012.  CT lumbar
spine 09/22/2012.  Lumbar MRI 02/28/2012.  [HOSPITAL] CT
abdomen and pelvis 09/09/2011.

MYELOGRAM LUMBAR
FINDINGS: L3-L4 and L4-L5 sequelae of posterior and interbody
fusion.  Hardware appears stable since 04/21/2013.  Vertebral
height and alignment appears stable.  Good intrathecal contrast
opacification.  Defect on the ventral thecal sac at L2-L3.  Severe
disc space loss and endplate spurring re-identified at this level.
Mass effect also on the thecal sac L5-S1.
IMPRESSION: 1.  Stable postoperative hardware at L3-L4 and L4-L5.
2.  Suspect degrees of spinal stenosis at L2-L3 and L5-S1.
3. See post myelogram CT findings below.

CT MYELOGRAPHY LUMBAR SPINE
FINDINGS: Stable visualized abdominal viscera.  Evidence of
previous surgical repair of an abdominal aortic aneurysm.

Postoperative changes to the posterior paraspinal soft tissues and
no adverse features identified.

Visible sacrum and SI joints within normal limits.

Since 09/22/2012, the transpedicular hardware has been revised.
See details below.  Overall stable lumbar vertebral height and
alignment since that time.

The normal myelographic appearance of the conus medullaris, tip at
T12-L1.

T12-L1:  Stable mild facet hypertrophy.  No stenosis.

L1-L2:  Stable severe disc space loss and vacuum disc phenomena.
Stable mild and mostly anterior eccentric circumferential disc
osteophyte complex.  No stenosis.

L2-L3:  Chronic severe disc space loss with vacuum disc phenomenon.
Chronic moderate circumferential disc osteophyte complex.  Mild
facet and ligament flavum hypertrophy greater on the right is
stable.  Mild right lateral recess stenosis without significant
spinal or foraminal stenosis.

L3-L4:  Interval revision of transpedicular hardware.  Currently,
the left L3 transpedicular screw traverses the left lateral recess
(course of the descending left L3 nerve roots).  In part this may
be due to a medial trajectory to  all voided the previous left
pedicle screw course (see coronal image 27).  No evidence of
hardware loosening.  The L4 transpedicular hardware is stable.  No
evidence of hardware loosening.  Partial decompression of this
level.  Interbody implant.  A small volume of interbody arthrodesis
is suspected (coronal image 19).  Also there appears to be
developing posterior element arthrodesis on the right. Residual
posterior element hypertrophy greater on the right.  At the disc
space level there is no significant spinal stenosis. No significant
foraminal stenosis.

L4-L5:  Stable hardware without evidence of loosening.  Evidence of
developing interbody arthrodesis (coronal image 11 - 13).
Developing left side posterior element arthrodesis.  Sequelae of
decompression.  The superior aspect of the left L4 neural foramen
is abutted by the left L4 pedicle screw, but overall there is no
stenosis suspected at the level.

L5-S1:  Mild chronic right eccentric broad-based disc osteophyte
complex.  Chronic moderate to severe facet hypertrophy, greater on
the left.  Left facet subchondral cysts have increased.  Lateral
mass effect on the thecal sac.  Mild spinal stenosis.  Mild right
lateral recess stenosis.  Mild left greater than right osseous
foraminal stenosis.
IMPRESSION: 1.  Revised transpedicular hardware at the L3 level.  The left
pedicle screw might affect the descending left L3 nerve roots..
2.  Improved appearance of developing interbody and posterior
element arthrodesis at both L3-L4 and L4-L5.
3.  Adjacent segment disease at L2-L3 with mild right lateral
recess stenosis.
4.  Adjacent segment disease at L5-S1 (chiefly facet arthropathy)
with mild spinal stenosis, right lateral recess stenosis, and left
greater than right L5 foraminal stenosis.

## 2014-12-20 ENCOUNTER — Ambulatory Visit: Payer: Medicare Other | Admitting: Vascular Surgery

## 2015-01-23 ENCOUNTER — Encounter: Payer: Self-pay | Admitting: Interventional Cardiology

## 2015-01-23 ENCOUNTER — Ambulatory Visit (INDEPENDENT_AMBULATORY_CARE_PROVIDER_SITE_OTHER): Payer: Medicare Other | Admitting: Interventional Cardiology

## 2015-01-23 VITALS — BP 110/60 | HR 73 | Ht 72.0 in | Wt 215.8 lb

## 2015-01-23 DIAGNOSIS — I25118 Atherosclerotic heart disease of native coronary artery with other forms of angina pectoris: Secondary | ICD-10-CM

## 2015-01-23 DIAGNOSIS — E785 Hyperlipidemia, unspecified: Secondary | ICD-10-CM

## 2015-01-23 DIAGNOSIS — I482 Chronic atrial fibrillation, unspecified: Secondary | ICD-10-CM

## 2015-01-23 DIAGNOSIS — I5042 Chronic combined systolic (congestive) and diastolic (congestive) heart failure: Secondary | ICD-10-CM

## 2015-01-23 DIAGNOSIS — Z7901 Long term (current) use of anticoagulants: Secondary | ICD-10-CM | POA: Diagnosis not present

## 2015-01-23 NOTE — Progress Notes (Signed)
Cardiology Office Note   Date:  01/23/2015   ID:  [name], DOB [birth year], MRN [id number]  PCP:  Irven Shelling, MD  Cardiologist:   Sinclair Grooms, MD   Chief Complaint  Patient presents with  . Follow-up      History of Present Illness: [name] is a 74 y.o. male who presents for  coronary artery disease, ischemic cardiomyopathy, hyperlipidemia, and strip paroxysmal atrial fibrillation. With AF he is relatively asymptomatic. He denies heart his symptoms. His become increasingly sedentary due to peripheral neuropathy. He denies orthopnea. He has not had syncope. He denies angina.  Her recent hospitalization was associated with sepsis and low blood pressure. No evidence of myocardial infarction.    Past Medical History  Diagnosis Date  . Hypercholesteremia     takes lipitor daily  . AAA (abdominal aortic aneurysm)   . Pain     back pain chronic- seen at pain clinic  . Neuropathy     in legs  . Bronchitis     hx of  . Ileus   . Arthritis     BacK  . Incisional hernia     "small, from AAA"  . Coronary atherosclerosis of native coronary artery     ejection fraction of 40%, prior inferior myocardial infarction in February 1993, PTCA, LAD rotational atherectomy in August of 1998, acute inferolateral myocardial infarction, ventricular fibrillation in July of 1999, PTCA and stent of obtuse marginal 1, 100% RCA 1/12 with collaterals from patent circumflex and LAD  . Hypertension     takes Ramipril daily  . MI (myocardial infarction)     1992 and 1997 sees Dr. Linard Millers as needed   . Dysrhythmia     "got out of rhythym a couple , but it straightened back out."  . Sleep apnea     uses CPAP--sleep study done at least 66yrs ago  . Chronic back pain   . Dry skin   . Acid reflux     takes Prilosec and Protonix daily  . Diabetes mellitus     takes Metformin daily  . Complication of anesthesia     -years ago hair fell out.;ileus after 2 of his surgeries     Past Surgical History  Procedure Laterality Date  . Abdominal surgery    . Abdominal aortic aneurysm repair    . Colonoscopy    . Cholecystectomy    . Back surgery  2013    Miminal Invasive x2 in [place].  . Cardiac catheterization  2009/2012  . Coronary angioplasty      2 stents  . Coronary stent placement       Current Outpatient Prescriptions  Medication Sig Dispense Refill  . ACCU-CHEK FASTCLIX LANCETS MISC     . ACCU-CHEK SMARTVIEW test strip     . atorvastatin (LIPITOR) 10 MG tablet Take 10 mg by mouth daily.    . cefUROXime (CEFTIN) 500 MG tablet Take 1 tablet (500 mg total) by mouth 2 (two) times daily with a meal. 8 tablet 0  . Coenzyme Q10 (CO Q 10) 100 MG CAPS Take 100 mg by mouth daily.    . diazepam (VALIUM) 5 MG tablet Take 5 mg by mouth at bedtime as needed.    . finasteride (PROPECIA) 1 MG tablet Take 1 mg by mouth daily.     . furosemide (LASIX) 40 MG tablet Take 40 mg by mouth daily as needed.  1  . gabapentin (NEURONTIN) 600 MG  tablet Take 600 mg by mouth 3 (three) times daily.    [name] glimepiride (AMARYL) 2 MG tablet Take 2 mg by mouth daily.    [name] KLOR-CON M10 10 MEQ tablet Take 10 mEq by mouth as needed.  1  . loperamide (IMODIUM) 2 MG capsule Take 1 capsule (2 mg total) by mouth every 6 (six) hours as needed for diarrhea or loose stools. 15 capsule 0  . metFORMIN (GLUCOPHAGE) 1000 MG tablet Take 1,000 mg by mouth 2 (two) times daily with a meal.    . metoprolol (LOPRESSOR) 50 MG tablet TAKE ONE TABLET BY MOUTH TWICE DAILY 60 tablet 6  . Multiple Vitamin (MULTIVITAMIN WITH MINERALS) TABS Take 1 tablet by mouth daily.    [name] omeprazole (PRILOSEC) 20 MG capsule Take 20 mg by mouth daily.    [name] oxyCODONE-acetaminophen (PERCOCET/ROXICET) 5-325 MG per tablet as needed.    . pregabalin (LYRICA) 75 MG capsule Take 75 mg by mouth 2 (two) times daily.    . ramipril (ALTACE) 10 MG capsule Take 10 mg by mouth daily.    . rivaroxaban (XARELTO) 20 MG TABS tablet Take 1  tablet (20 mg total) by mouth daily with supper. 30 tablet 3  . tamsulosin (FLOMAX) 0.4 MG CAPS capsule Take 1 capsule (0.4 mg total) by mouth daily. 30 capsule 0  . vitamin B-12 (CYANOCOBALAMIN) 1000 MCG tablet Take 1,000 mcg by mouth daily.     . [DISCONTINUED] promethazine (PHENERGAN) 25 MG tablet Take 25 mg by mouth every 6 (six) hours as needed.     No current facility-administered medications for this visit.    Allergies:   Cymbalta; Lyrica; and Spinach    Social History:  The patient  reports that he quit smoking about 23 years ago. He has never used smokeless tobacco. He reports that he drinks alcohol. He reports that he does not use illicit drugs.   Family History:  The patient's family history includes Arthritis in his father; Coronary artery disease in his father and mother; Diabetes in his father and mother; Rheum arthritis in his father.    ROS:  Please see the history of present illness.   Otherwise, review of systems are positive for no recent history of nitroglycerin use. Appetite is been stable. Low back discomfort and numbness with pain in both legs, which is neuropathy associated..   All other systems are reviewed and negative.    PHYSICAL EXAM: VS:  BP 110/60 mmHg  Pulse 73  Ht 6' (1.829 m)  Wt 215 lb 12.8 oz (97.886 kg)  BMI 29.26 kg/m2 , BMI Body mass index is 29.26 kg/(m^2). GEN: Well nourished, well developed, in no acute distress HEENT: normal Neck: no JVD, carotid bruits, or masses Cardiac: RRR; no murmurs, rubs, or gallops,no edema  Respiratory:  clear to auscultation bilaterally, normal work of breathing GI: soft, nontender, nondistended, + BS MS: no deformity or atrophy Skin: warm and dry, no rash Neuro:  Strength and sensation are intact Psych: euthymic mood, full affect   EKG:  EKG is ordered today. The ekg ordered today demonstrates normal sinus rhythm, inferolateral infarction, and right bundle branch block.   Recent Labs: 12/12/2014: ALT 25;  TSH 0.317* 12/13/2014: BUN 9; Creatinine 0.77; Hemoglobin 12.8*; Magnesium 1.6; Platelets 105*; Potassium 4.2; Sodium 138    Lipid Panel    Component Value Date/Time   CHOL  11/26/2010 0750    147        ATP III CLASSIFICATION:  <200  mg/dL   Desirable  200-239  mg/dL   Borderline High  >=240    mg/dL   High          TRIG 132 11/26/2010 0750   HDL 29* 11/26/2010 0750   CHOLHDL 5.1 11/26/2010 0750   VLDL 26 11/26/2010 0750   LDLCALC  11/26/2010 0750    92        Total Cholesterol/HDL:CHD Risk Coronary Heart Disease Risk Table                     Men   Women  1/2 Average Risk   3.4   3.3  Average Risk       5.0   4.4  2 X Average Risk   9.6   7.1  3 X Average Risk  23.4   11.0        Use the calculated Patient Ratio above and the CHD Risk Table to determine the patient's CHD Risk.        ATP III CLASSIFICATION (LDL):  <100     mg/dL   Optimal  100-129  mg/dL   Near or Above                    Optimal  130-159  mg/dL   Borderline  160-189  mg/dL   High  >190     mg/dL   Very High      Wt Readings from Last 3 Encounters:  01/23/15 215 lb 12.8 oz (97.886 kg)  12/14/14 217 lb 3.3 oz (98.523 kg)  11/15/14 208 lb (94.348 kg)      Other studies Reviewed: Additional studies/ records that were reviewed today include: Old clinical data.. Review of the above records demonstrates: Echo from 2014 demonstrated EF 25-30%. Data was not associated with any evidence that heart 3 therapy was optimized.   ASSESSMENT AND PLAN:  Atherosclerosis of native coronary artery with other form of angina pectoris: Denies angina  Chronic combined systolic and diastolic heart failure: No recent LVEF has been established. Last of record was 30-35% in 2014.  Long term current use of anticoagulant therapy: [place] and without bleeding complications  Paroxysmal atrial fibrillation: Normal sinus rhythm today  Hyperlipidemia     Current medicines are reviewed at length with the  patient today.  The patient does not have concerns regarding medicines.  The following changes have been made:  no change: Needs to have repeat 2-D Doppler echocardiogram to assess LV systolic function and if remains less than 35% consideration of AICD.  Labs/ tests ordered today include:   Orders Placed This Encounter  Procedures  . EKG 12-Lead  . 2D Echocardiogram without contrast     Disposition:   FU with Linard Millers in 1 year   Signed, Sinclair Grooms, MD  01/23/2015 2:30 PM    [address], [place], [place]  [address] Phone: [phone]; Fax: [phone]

## 2015-01-23 NOTE — Patient Instructions (Signed)
Your physician recommends that you continue on your current medications as directed. Please refer to the Current Medication list given to you today.  Your physician has requested that you have an echocardiogram. Echocardiography is a painless test that uses sound waves to create images of your heart. It provides your doctor with information about the size and shape of your heart and how well your heart's chambers and valves are working. This procedure takes approximately one hour. There are no restrictions for this procedure.  Your physician wants you to follow-up in: 1 year with Dr. Tamala Julian.  You will receive a reminder letter in the mail two months in advance. If you don't receive a letter, please call our office to schedule the follow-up appointment.

## 2015-02-01 ENCOUNTER — Ambulatory Visit (HOSPITAL_COMMUNITY): Payer: Medicare Other | Attending: Cardiology | Admitting: Radiology

## 2015-02-01 DIAGNOSIS — I5042 Chronic combined systolic (congestive) and diastolic (congestive) heart failure: Secondary | ICD-10-CM

## 2015-02-01 DIAGNOSIS — I25118 Atherosclerotic heart disease of native coronary artery with other forms of angina pectoris: Secondary | ICD-10-CM | POA: Insufficient documentation

## 2015-02-01 NOTE — Progress Notes (Signed)
Echocardiogram performed.  

## 2015-02-02 ENCOUNTER — Telehealth: Payer: Self-pay | Admitting: Interventional Cardiology

## 2015-02-02 NOTE — Telephone Encounter (Signed)
Follow up:   Pt thinks he got a call today.  Pt would like a call back about his Echo results please.

## 2015-02-02 NOTE — Telephone Encounter (Signed)
Patient notified of test results and Dr. Thompson Caul notations for the patient. Patient verbalized understanding and agreement to continue with current treatment plan. Patient expressed deep appreciation and happiness with news.

## 2015-02-02 NOTE — Telephone Encounter (Signed)
lmtcb 4/7 @3 :30

## 2015-02-28 ENCOUNTER — Telehealth: Payer: Self-pay | Admitting: Interventional Cardiology

## 2015-02-28 NOTE — Telephone Encounter (Signed)
New message    Patient calling stating he would like the nurse to call him back did not disclose any information.    Ask patient was he having any chestpain & sob - states is not emergency issues

## 2015-02-28 NOTE — Telephone Encounter (Signed)
Returned pt call. Pt rqst to speak with Dr.Smith only. Pt sts that its not an emergency, he has questions and would like to speak with Dr.Smith directly. Adv pt I will fwd Dr.Smith a message to call him.

## 2015-03-02 NOTE — Telephone Encounter (Signed)
Follow up      Pt calling to remind the nurse to ask Dr Tamala Julian to call him.  Told pt Dr Tamala Julian would be in tomorrow am

## 2015-03-03 NOTE — Telephone Encounter (Signed)
Message routed to Dr Smith

## 2015-03-06 NOTE — Telephone Encounter (Signed)
Left message to return call 

## 2015-03-07 NOTE — Telephone Encounter (Signed)
Pt has spoken with Dr.Smith directly

## 2015-04-05 ENCOUNTER — Other Ambulatory Visit: Payer: Self-pay | Admitting: Interventional Cardiology

## 2015-04-10 ENCOUNTER — Telehealth: Payer: Self-pay | Admitting: Interventional Cardiology

## 2015-04-10 NOTE — Telephone Encounter (Signed)
New Message    Pt c/o BP issue: STAT if pt c/o blurred vision, one-sided weakness or slurred speech  1. What are your last 5 BP readings?   HR 127     BP 128-68     BP 113-81  HR  83   BP99-73   HR 98   HR 137   HR 111/91  2. Are you having any other symptoms (ex. Dizziness, headache, blurred vision, passed out)? Not at this time, but he has been  3. What is your BP issue? Heart skipping beats, off rhythm / BP keeps going up and down

## 2015-04-10 NOTE — Telephone Encounter (Signed)
Calling stating he thinks he might be out of rhythm.  States he has been checking BP and HR about every 5 min over the last 30 min.  BP has been 128/68 HR 127; 113/81 HR 83; 99/73 HR 98 and just now 117/72 HR 91.  The range has been 127-84 over the last 30 min. States he is slightly SOB and that is why he started checking his HR.  Spoke w/Dr. Tamala Julian who advises for him to take Metoprolol 75 mg now (1 1/2) tablets and if HR hasn't coverted in a couple of hours then go to ER.  Also advised him to find a quite place and try to relax and can take a Valium 5 mg. Advised to take BP and HR in about 2 hrs and not to try to take every 5 min. Also advised to call tomorrow and speak with Dr. Thompson Caul CMA, Rush Farmer to let her know how he is feeling. He verbalizes understanding and will take the medication now and check BP in 2 hrs.

## 2015-04-11 ENCOUNTER — Encounter: Payer: Self-pay | Admitting: *Deleted

## 2015-04-11 ENCOUNTER — Encounter: Payer: Self-pay | Admitting: Physician Assistant

## 2015-04-11 ENCOUNTER — Ambulatory Visit (INDEPENDENT_AMBULATORY_CARE_PROVIDER_SITE_OTHER): Payer: Medicare Other | Admitting: Physician Assistant

## 2015-04-11 ENCOUNTER — Telehealth: Payer: Self-pay | Admitting: *Deleted

## 2015-04-11 VITALS — BP 94/60 | HR 101 | Ht 72.0 in | Wt 218.8 lb

## 2015-04-11 DIAGNOSIS — I10 Essential (primary) hypertension: Secondary | ICD-10-CM

## 2015-04-11 DIAGNOSIS — I2510 Atherosclerotic heart disease of native coronary artery without angina pectoris: Secondary | ICD-10-CM

## 2015-04-11 DIAGNOSIS — I480 Paroxysmal atrial fibrillation: Secondary | ICD-10-CM | POA: Diagnosis not present

## 2015-04-11 DIAGNOSIS — E785 Hyperlipidemia, unspecified: Secondary | ICD-10-CM

## 2015-04-11 DIAGNOSIS — I255 Ischemic cardiomyopathy: Secondary | ICD-10-CM

## 2015-04-11 DIAGNOSIS — G4730 Sleep apnea, unspecified: Secondary | ICD-10-CM

## 2015-04-11 DIAGNOSIS — I5042 Chronic combined systolic (congestive) and diastolic (congestive) heart failure: Secondary | ICD-10-CM | POA: Diagnosis not present

## 2015-04-11 LAB — CBC WITH DIFFERENTIAL/PLATELET
Basophils Absolute: 0 10*3/uL (ref 0.0–0.1)
Basophils Relative: 0.2 % (ref 0.0–3.0)
Eosinophils Absolute: 0 10*3/uL (ref 0.0–0.7)
Eosinophils Relative: 0.2 % (ref 0.0–5.0)
HCT: 40.7 % (ref 39.0–52.0)
Hemoglobin: 14.1 g/dL (ref 13.0–17.0)
Lymphocytes Relative: 26.9 % (ref 12.0–46.0)
Lymphs Abs: 1.5 10*3/uL (ref 0.7–4.0)
MCHC: 34.7 g/dL (ref 30.0–36.0)
MCV: 90.8 fl (ref 78.0–100.0)
Monocytes Absolute: 0.5 10*3/uL (ref 0.1–1.0)
Monocytes Relative: 9.7 % (ref 3.0–12.0)
Neutro Abs: 3.4 10*3/uL (ref 1.4–7.7)
Neutrophils Relative %: 63 % (ref 43.0–77.0)
Platelets: 180 10*3/uL (ref 150.0–400.0)
RBC: 4.48 Mil/uL (ref 4.22–5.81)
RDW: 13.9 % (ref 11.5–15.5)
WBC: 5.5 10*3/uL (ref 4.0–10.5)

## 2015-04-11 LAB — BASIC METABOLIC PANEL
BUN: 16 mg/dL (ref 6–23)
CO2: 27 mEq/L (ref 19–32)
Calcium: 10 mg/dL (ref 8.4–10.5)
Chloride: 101 mEq/L (ref 96–112)
Creatinine, Ser: 0.77 mg/dL (ref 0.40–1.50)
GFR: 104.37 mL/min (ref >60.00)
Glucose, Bld: 266 mg/dL — ABNORMAL HIGH (ref 70–99)
Potassium: 4.7 mEq/L (ref 3.5–5.1)
Sodium: 133 mEq/L — ABNORMAL LOW (ref 135–145)

## 2015-04-11 NOTE — Telephone Encounter (Signed)
Pt called to give an update on symptoms. Pt sts that he did take the additional Metoprolol '75mg'$  yesterday. He also took a Valium '5mg'$ , after speaking with Dr.Smith who adv him that it would be ok. Pt not sure if he converted back to NSR. He slept well last night. He woke up this morning feeling fatigued and "strange". He could tell his pulse was still sporadic. His bp and heart rate this morning was 111/79 109bpm. Pt has not taken any meds this morning. Adv pt to go ahead an take his morning meds including Metoprolol. Adv him that Dr.Smith is out of the office today, I will talk with another provider and call back with their recommendation. Pt appreciative and verbalized understanding.

## 2015-04-11 NOTE — Patient Instructions (Signed)
Medication Instructions:  1. HOLD ALTACE UNTIL AFTER YOUR CARDIOVERSION 2. HOLD AMARYL WHILE YOU ARE NPO BEFORE YOUR CARDIOVERSION  Labwork: TODAY BMET, CBC W/DIFF; FOR DCCV  Testing/Procedures: Your physician has recommended that you have a Cardioversion (DCCV). Electrical Cardioversion uses a jolt of electricity to your heart either through paddles or wired patches attached to your chest. This is a controlled, usually prescheduled, procedure. Defibrillation is done under light anesthesia in the hospital, and you usually go home the day of the procedure. This is done to get your heart back into a normal rhythm. You are not awake for the procedure. Please see the instruction sheet given to you today.   Follow-Up: 05/04/15 @ 3 PM WITH SCOTT WEAVER, PAC SAME DAY DR. Tamala Julian IS IN THE OFFICE  Any Other Special Instructions Will Be Listed Below (If Applicable).

## 2015-04-11 NOTE — Telephone Encounter (Signed)
Pt aware post dccv appt scheduled with Dr.Smith on 7/12 @ 10:30. Pt cvrr (Xarelto) appt rescheduled for the same day

## 2015-04-11 NOTE — Telephone Encounter (Signed)
Pt aware of appt today with Margaret Pyle '@11'$ :30am. Pt appreciative and verbalized understanding.

## 2015-04-11 NOTE — Progress Notes (Signed)
Cardiology Office Note   Date:  04/11/2015   ID:  MAXAMUS COLAO, DOB [birth year], MRN [id number]  PCP:  Irven Shelling, MD  Cardiologist:  Dr. Daneen Schick     Chief Complaint  Patient presents with  . Atrial Fibrillation     History of Present Illness: TYRELLE RACZKA is a 74 y.o. male with a hx of CAD status post prior inferior MI in 1993, PCI to the LAD in 05/1997, recurrent inferolateral MI complicated by V. fib arrest in 04/1998, prior PCI to OM1. Patient is known to have a chronic total occlusion of the RCA by cardiac catheterization in 10/2010. Other history includes ischemic cardiomyopathy, PAF, diabetes, HL, OSA, AAA status post repair 03/2008, lumbar spine surgery. Prior ejection fraction 25-30%. Most recent echocardiogram in April 2016 demonstrated improved LV function with an EF of 40-45%. Last seen by Dr. Tamala Julian 01/23/15. Patient called in today with symptoms of recurrent atrial fibrillation. He took extra beta blocker dose yesterday. This morning, he continued to feel that he was still in atrial fibrillation. He is added on for further evaluation.    He started feeling bad yesterday.  He just feels like something is not right. Denies any chest pain.  He denies any significant DOE.  He denies orthopnea, PND.  No edema.  No syncope or near syncope.  Does feel fatigued.  He took extra Metoprolol yesterday.  Slept ok last night.  Still noted elevated HR today and continued fatigue.  He has been adherent to Xarelto.  He has not missed a dose in over 6 mos.  He denies any bleeding problems.     Studies/Reports Reviewed Today:  Echo 02/01/15 EF 40-45%, diffuse HK, inferior HK, grade 1 diastolic dysfunction Mildly dilated aortic root (40 mm) Moderate LAE  LHC 11/26/10 LM: Patent LCx: Patent, OM1 stent patent then 40% LAD: Stent patent RCA: Proximal occlusion with left to right collaterals EF 40%  Past Medical History  Diagnosis Date  . Hypercholesteremia     takes lipitor daily    . AAA (abdominal aortic aneurysm)   . Pain     back pain chronic- seen at pain clinic  . Neuropathy     in legs  . Bronchitis     hx of  . Ileus   . Arthritis     BacK  . Incisional hernia     "small, from AAA"  . Coronary atherosclerosis of native coronary artery     ejection fraction of 40%, prior inferior myocardial infarction in February 1993, PTCA, LAD rotational atherectomy in August of 1998, acute inferolateral myocardial infarction, ventricular fibrillation in July of 1999, PTCA and stent of obtuse marginal 1, 100% RCA 1/12 with collaterals from patent circumflex and LAD  . Hypertension     takes Ramipril daily  . MI (myocardial infarction)     1992 and 1997 sees Dr. Linard Millers as needed   . Dysrhythmia     "got out of rhythym a couple , but it straightened back out."  . Sleep apnea     uses CPAP--sleep study done at least 56yr ago  . Chronic back pain   . Dry skin   . Acid reflux     takes Prilosec and Protonix daily  . Diabetes mellitus     takes Metformin daily  . Complication of anesthesia     -years ago hair fell out.;ileus after 2 of his surgeries    Past Surgical History  Procedure Laterality Date  .  Abdominal surgery    . Abdominal aortic aneurysm repair    . Colonoscopy    . Cholecystectomy    . Back surgery  2013    Miminal Invasive x2 in [place].  . Cardiac catheterization  2009/2012  . Coronary angioplasty      2 stents  . Coronary stent placement       Current Outpatient Prescriptions  Medication Sig Dispense Refill  . ACCU-CHEK FASTCLIX LANCETS MISC     . ACCU-CHEK SMARTVIEW test strip     . Coenzyme Q10 (CO Q 10) 100 MG CAPS Take 100 mg by mouth daily.    . diazepam (VALIUM) 5 MG tablet Take 5 mg by mouth at bedtime as needed (leg pain).     . finasteride (PROPECIA) 1 MG tablet Take 1 mg by mouth daily.     . furosemide (LASIX) 40 MG tablet Take 40 mg by mouth daily as needed (swelling).   1  . gabapentin (NEURONTIN) 600 MG tablet Take  600 mg by mouth 3 (three) times daily.    [name] glimepiride (AMARYL) 2 MG tablet Take 2 mg by mouth daily.    [name] KLOR-CON M10 10 MEQ tablet Take 10 mEq by mouth as needed.  1  . loperamide (IMODIUM) 2 MG capsule Take 1 capsule (2 mg total) by mouth every 6 (six) hours as needed for diarrhea or loose stools. 15 capsule 0  . metFORMIN (GLUCOPHAGE) 1000 MG tablet Take 1,000 mg by mouth 2 (two) times daily with a meal.    . metoprolol (LOPRESSOR) 50 MG tablet TAKE ONE TABLET BY MOUTH TWICE DAILY 60 tablet 10  . Multiple Vitamin (MULTIVITAMIN WITH MINERALS) TABS Take 1 tablet by mouth daily.    [name] omeprazole (PRILOSEC) 20 MG capsule Take 20 mg by mouth daily.    [name] oxyCODONE-acetaminophen (PERCOCET/ROXICET) 5-325 MG per tablet as needed.    . pregabalin (LYRICA) 75 MG capsule Take 75 mg by mouth 2 (two) times daily.    . ramipril (ALTACE) 10 MG capsule Take 10 mg by mouth daily.    . rivaroxaban (XARELTO) 20 MG TABS tablet Take 1 tablet (20 mg total) by mouth daily with supper. 30 tablet 3  . tamsulosin (FLOMAX) 0.4 MG CAPS capsule Take 1 capsule (0.4 mg total) by mouth daily. 30 capsule 0  . vitamin B-12 (CYANOCOBALAMIN) 1000 MCG tablet Take 1,000 mcg by mouth daily.     . cefUROXime (CEFTIN) 500 MG tablet Take 1 tablet (500 mg total) by mouth 2 (two) times daily with a meal. (Patient not taking: Reported on 04/11/2015) 8 tablet 0  . [DISCONTINUED] promethazine (PHENERGAN) 25 MG tablet Take 25 mg by mouth every 6 (six) hours as needed.     No current facility-administered medications for this visit.    Allergies:   Cymbalta; Lyrica; and Spinach    Social History:  The patient  reports that he quit smoking about 23 years ago. He has never used smokeless tobacco. He reports that he drinks alcohol. He reports that he does not use illicit drugs.   Family History:  The patient's family history includes Arthritis in his father; Coronary artery disease in his father and mother; Diabetes in his father and  mother; Rheum arthritis in his father.    ROS:   Please see the history of present illness.   Review of Systems  Musculoskeletal: Positive for back pain and myalgias.  Gastrointestinal: Positive for diarrhea.  All other systems reviewed and are negative.  PHYSICAL EXAM: VS:  BP 94/60 mmHg  Pulse 101  Ht 6' (1.829 m)  Wt 218 lb 12.8 oz (99.247 kg)  BMI 29.67 kg/m2    Wt Readings from Last 3 Encounters:  04/11/15 218 lb 12.8 oz (99.247 kg)  01/23/15 215 lb 12.8 oz (97.886 kg)  12/14/14 217 lb 3.3 oz (98.523 kg)     GEN: Well nourished, well developed, in no acute distress HEENT: normal Neck: no JVD, no masses Cardiac:  Normal S1/S2, irregularly irregular rhythm; no murmur ,  no rubs or gallops, no edema   Respiratory:  clear to auscultation bilaterally, no wheezing, rhonchi or rales. GI: soft, nontender, nondistended, + BS MS: no deformity or atrophy Skin: warm and dry  Neuro:  CNs II-XII intact, Strength and sensation are intact Psych: Normal affect   EKG:  EKG is ordered today.  It demonstrates:   Atrial fibrillation, HR 101, inferior Q waves, PVC   Recent Labs: 12/12/2014: ALT 25; TSH 0.317* 12/13/2014: BUN 9; Creatinine, Ser 0.77; Hemoglobin 12.8*; Magnesium 1.6; Platelets 105*; Potassium 4.2; Sodium 138    Lipid Panel    Component Value Date/Time   CHOL  11/26/2010 0750    147        ATP III CLASSIFICATION:  <200     mg/dL   Desirable  200-239  mg/dL   Borderline High  >=240    mg/dL   High          TRIG 132 11/26/2010 0750   HDL 29* 11/26/2010 0750   CHOLHDL 5.1 11/26/2010 0750   VLDL 26 11/26/2010 0750   LDLCALC  11/26/2010 0750    92        Total Cholesterol/HDL:CHD Risk Coronary Heart Disease Risk Table                     Men   Women  1/2 Average Risk   3.4   3.3  Average Risk       5.0   4.4  2 X Average Risk   9.6   7.1  3 X Average Risk  23.4   11.0        Use the calculated Patient Ratio above and the CHD Risk Table to determine the  patient's CHD Risk.        ATP III CLASSIFICATION (LDL):  <100     mg/dL   Optimal  100-129  mg/dL   Near or Above                    Optimal  130-159  mg/dL   Borderline  160-189  mg/dL   High  >190     mg/dL   Very High      ASSESSMENT AND PLAN:  PAF (paroxysmal atrial fibrillation):  He is back in AFib.  He has converted on his own in the past. He has never had DCCV. He is symptomatic with AFib.  HR is marginally controlled with HR 101 today.  He would definitely feel better in NSR.  He has been adherent to his oral anticoagulation therapy with Xarelto. I have recommended proceeding with DCCV.  We can do this without a TEE as he has been anticoagulated for > 1 month and symptoms started < 24 hours ago.  I reviewed this with Dr. Sherren Mocha (DOD) today who agreed.  The patient is eager to get it done today.  However, the schedule is full.  Will plan on DCCV tomorrow AM.  BMET, CBC will be obtained today.  FU with me or Dr. Daneen Schick after his DCCV. If he fails DCCV or has recurrent AFib, will need to consider anti-arrhythmic drug therapy.  Prior QTc ok.  He would probably be a good candidate for Tikosyn. Otherwise, would have to consider Amiodarone or Sotalol.    Coronary artery disease involving native coronary artery of native heart without angina pectoris:  No angina.  He is not on ASA as he is taking Xarelto. Continue beta-blocker, ACE inhibitor, statin.   Chronic combined systolic and diastolic heart failure:   Volume appears stable.    Ischemic cardiomyopathy:  Continue beta-blocker, ACE inhibitor and Lasix (takes prn).    Essential hypertension:  BP running a little low today.  I have asked him to hold the Ramipril until after his DCCV.    Hyperlipidemia:  Continue statin.  Diabetes:  Hold Amaryl while NPO.    Current medicines are reviewed at length with the patient today.  Concerns regarding medicines are as outlined above.  The following changes have been made:    As  above  Labs/ tests ordered today include:   Orders Placed This Encounter  Procedures  . Basic Metabolic Panel (BMET)  . CBC w/Diff  . EKG 12-Lead    Disposition:   FU with Dr. Daneen Schick or me 2 weeks after DCCV.     Signed, Versie Starks, MHS 04/11/2015 12:37 PM    [facility] [address], [place], [place]  [address] Phone: [phone]; Fax: [phone]

## 2015-04-11 NOTE — Telephone Encounter (Signed)
Pt notified of lab results and he is ok to have DCCV 6/15. Pt advised to f/u w/PCP about elevated glucose, pt said he has appt the end of this month and will be discussing PCP about glucose.

## 2015-04-12 ENCOUNTER — Encounter (HOSPITAL_COMMUNITY): Payer: Self-pay | Admitting: *Deleted

## 2015-04-12 ENCOUNTER — Ambulatory Visit (HOSPITAL_COMMUNITY): Payer: Medicare Other | Admitting: Certified Registered"

## 2015-04-12 ENCOUNTER — Ambulatory Visit (HOSPITAL_COMMUNITY)
Admission: RE | Admit: 2015-04-12 | Discharge: 2015-04-12 | Disposition: A | Discharge status: Home / Self Care | Payer: Medicare Other | Source: Ambulatory Visit | Attending: Cardiology | Admitting: Cardiology

## 2015-04-12 ENCOUNTER — Encounter (HOSPITAL_COMMUNITY)
Admission: RE | Disposition: A | Discharge status: Home / Self Care | Payer: Self-pay | Source: Ambulatory Visit | Attending: Cardiology

## 2015-04-12 DIAGNOSIS — M4690 Unspecified inflammatory spondylopathy, site unspecified: Secondary | ICD-10-CM | POA: Diagnosis not present

## 2015-04-12 DIAGNOSIS — I252 Old myocardial infarction: Secondary | ICD-10-CM | POA: Diagnosis not present

## 2015-04-12 DIAGNOSIS — Z7901 Long term (current) use of anticoagulants: Secondary | ICD-10-CM | POA: Insufficient documentation

## 2015-04-12 DIAGNOSIS — G4733 Obstructive sleep apnea (adult) (pediatric): Secondary | ICD-10-CM | POA: Insufficient documentation

## 2015-04-12 DIAGNOSIS — I5042 Chronic combined systolic (congestive) and diastolic (congestive) heart failure: Secondary | ICD-10-CM | POA: Diagnosis not present

## 2015-04-12 DIAGNOSIS — G5792 Unspecified mononeuropathy of left lower limb: Secondary | ICD-10-CM | POA: Insufficient documentation

## 2015-04-12 DIAGNOSIS — R9431 Abnormal electrocardiogram [ECG] [EKG]: Secondary | ICD-10-CM | POA: Insufficient documentation

## 2015-04-12 DIAGNOSIS — I480 Paroxysmal atrial fibrillation: Secondary | ICD-10-CM

## 2015-04-12 DIAGNOSIS — M549 Dorsalgia, unspecified: Secondary | ICD-10-CM | POA: Insufficient documentation

## 2015-04-12 DIAGNOSIS — Z79899 Other long term (current) drug therapy: Secondary | ICD-10-CM | POA: Diagnosis not present

## 2015-04-12 DIAGNOSIS — G8929 Other chronic pain: Secondary | ICD-10-CM | POA: Diagnosis not present

## 2015-04-12 DIAGNOSIS — I10 Essential (primary) hypertension: Secondary | ICD-10-CM | POA: Diagnosis not present

## 2015-04-12 DIAGNOSIS — I739 Peripheral vascular disease, unspecified: Secondary | ICD-10-CM | POA: Insufficient documentation

## 2015-04-12 DIAGNOSIS — Z87891 Personal history of nicotine dependence: Secondary | ICD-10-CM | POA: Insufficient documentation

## 2015-04-12 DIAGNOSIS — E780 Pure hypercholesterolemia: Secondary | ICD-10-CM | POA: Diagnosis not present

## 2015-04-12 DIAGNOSIS — Z79891 Long term (current) use of opiate analgesic: Secondary | ICD-10-CM | POA: Diagnosis not present

## 2015-04-12 DIAGNOSIS — I2510 Atherosclerotic heart disease of native coronary artery without angina pectoris: Secondary | ICD-10-CM | POA: Diagnosis not present

## 2015-04-12 DIAGNOSIS — K219 Gastro-esophageal reflux disease without esophagitis: Secondary | ICD-10-CM | POA: Diagnosis not present

## 2015-04-12 DIAGNOSIS — Z9989 Dependence on other enabling machines and devices: Secondary | ICD-10-CM | POA: Insufficient documentation

## 2015-04-12 DIAGNOSIS — M1990 Unspecified osteoarthritis, unspecified site: Secondary | ICD-10-CM | POA: Diagnosis not present

## 2015-04-12 DIAGNOSIS — I255 Ischemic cardiomyopathy: Secondary | ICD-10-CM | POA: Diagnosis not present

## 2015-04-12 DIAGNOSIS — G5791 Unspecified mononeuropathy of right lower limb: Secondary | ICD-10-CM | POA: Diagnosis not present

## 2015-04-12 DIAGNOSIS — Z955 Presence of coronary angioplasty implant and graft: Secondary | ICD-10-CM | POA: Diagnosis not present

## 2015-04-12 DIAGNOSIS — E119 Type 2 diabetes mellitus without complications: Secondary | ICD-10-CM | POA: Insufficient documentation

## 2015-04-12 DIAGNOSIS — I4891 Unspecified atrial fibrillation: Secondary | ICD-10-CM | POA: Diagnosis present

## 2015-04-12 HISTORY — PX: CARDIOVERSION: SHX1299

## 2015-04-12 LAB — GLUCOSE, CAPILLARY: Glucose-Capillary: 212 mg/dL — ABNORMAL HIGH (ref 65–99)

## 2015-04-12 SURGERY — CARDIOVERSION
Anesthesia: General

## 2015-04-12 MED ORDER — SODIUM CHLORIDE 0.9 % IV SOLN
250.0000 mL | INTRAVENOUS | Status: DC
  Administered 2015-04-12: 250 mL via INTRAVENOUS

## 2015-04-12 MED ORDER — LIDOCAINE HCL (CARDIAC) 20 MG/ML IV SOLN
INTRAVENOUS | Status: DC | PRN
  Administered 2015-04-12: 100 mg via INTRAVENOUS

## 2015-04-12 MED ORDER — LIDOCAINE HCL (CARDIAC) 20 MG/ML IV SOLN
INTRAVENOUS | Status: AC
  Filled 2015-04-12: qty 5

## 2015-04-12 MED ORDER — PROPOFOL 10 MG/ML IV BOLUS
INTRAVENOUS | Status: DC | PRN
  Administered 2015-04-12: 60 mg via INTRAVENOUS

## 2015-04-12 MED ORDER — PROPOFOL 10 MG/ML IV BOLUS
INTRAVENOUS | Status: AC
Start: 2015-04-12 — End: 2015-04-12
  Filled 2015-04-12: qty 20

## 2015-04-12 MED ORDER — SODIUM CHLORIDE 0.9 % IJ SOLN: 3.0000 mL | INTRAMUSCULAR | Status: DC | PRN

## 2015-04-12 MED ORDER — SODIUM CHLORIDE 0.9 % IJ SOLN: 3.0000 mL | Freq: Two times a day (BID) | INTRAMUSCULAR | Status: DC

## 2015-04-12 NOTE — Interval H&P Note (Signed)
History and Physical Interval Note:  04/12/2015 9:29 AM  [name]  has presented today for surgery, with the diagnosis of afib  The various methods of treatment have been discussed with the patient and family. After consideration of risks, benefits and other options for treatment, the patient has consented to  Procedure(s): CARDIOVERSION (N/A) as a surgical intervention .  The patient's history has been reviewed, patient examined, no change in status, stable for surgery.  I have reviewed the patient's chart and labs.  Questions were answered to the patient's satisfaction.     Darrly Loberg R

## 2015-04-12 NOTE — Discharge Instructions (Signed)
Monitored Anesthesia Care Monitored anesthesia care is an anesthesia service for a medical procedure. Anesthesia is the loss of the ability to feel pain. It is produced by medicines called anesthetics. It may affect a small area of your body (local anesthesia), a large area of your body (regional anesthesia), or your entire body (general anesthesia). The need for monitored anesthesia care depends your procedure, your condition, and the potential need for regional or general anesthesia. It is often provided during procedures where:   General anesthesia may be needed if there are complications. This is because you need special care when you are under general anesthesia.   You will be under local or regional anesthesia. This is so that you are able to have higher levels of anesthesia if needed.   You will receive calming medicines (sedatives). This is especially the case if sedatives are given to put you in a semi-conscious state of relaxation (deep sedation). This is because the amount of sedative needed to produce this state can be hard to predict. Too much of a sedative can produce general anesthesia. Monitored anesthesia care is performed by one or more health care providers who have special training in all types of anesthesia. You will need to meet with these health care providers before your procedure. During this meeting, they will ask you about your medical history. They will also give you instructions to follow. (For example, you will need to stop eating and drinking before your procedure. You may also need to stop or change medicines you are taking.) During your procedure, your health care providers will stay with you. They will:   Watch your condition. This includes watching your blood pressure, breathing, and level of pain.   Diagnose and treat problems that occur.   Give medicines if they are needed. These may include calming medicines (sedatives) and anesthetics.   Make sure you are  comfortable.  Having monitored anesthesia care does not necessarily mean that you will be under anesthesia. It does mean that your health care providers will be able to manage anesthesia if you need it or if it occurs. It also means that you will be able to have a different type of anesthesia than you are having if you need it. When your procedure is complete, your health care providers will continue to watch your condition. They will make sure any medicines wear off before you are allowed to go home.  Document Released: 07/10/2005 Document Revised: 02/28/2014 Document Reviewed: 11/25/2012 [facility] Patient Information 2015 [place], [place]. This information is not intended to replace advice given to you by your health care provider. Make sure you discuss any questions you have with your health care provider. Electrical Cardioversion, Care After Refer to this sheet in the next few weeks. These instructions provide you with information on caring for yourself after your procedure. Your health care provider may also give you more specific instructions. Your treatment has been planned according to current medical practices, but problems sometimes occur. Call your health care provider if you have any problems or questions after your procedure. WHAT TO EXPECT AFTER THE PROCEDURE After your procedure, it is typical to have the following sensations:  Some redness on the skin where the shocks were delivered. If this is tender, a sunburn lotion or hydrocortisone cream may help.  Possible return of an abnormal heart rhythm within hours or days after the procedure. HOME CARE INSTRUCTIONS  Take medicines only as directed by your health care provider. Be sure you understand how  and when to take your medicine.  Learn how to feel your pulse and check it often.  Limit your activity for 48 hours after the procedure or as directed by your health care provider.  Avoid or minimize caffeine and other stimulants as  directed by your health care provider. SEEK MEDICAL CARE IF:  You feel like your heart is beating too fast or your pulse is not regular.  You have any questions about your medicines.  You have bleeding that will not stop. SEEK IMMEDIATE MEDICAL CARE IF:  You are dizzy or feel faint.  It is hard to breathe or you feel short of breath.  There is a change in discomfort in your chest.  Your speech is slurred or you have trouble moving an arm or leg on one side of your body.  You get a serious muscle cramp that does not go away.  Your fingers or toes turn cold or blue. Document Released: 08/04/2013 Document Revised: 02/28/2014 Document Reviewed: 08/04/2013 [facility] Patient Information 2015 [place], [place]. This information is not intended to replace advice given to you by your health care provider. Make sure you discuss any questions you have with your health care provider.

## 2015-04-12 NOTE — CV Procedure (Signed)
   Electrical Cardioversion Procedure Note [name] [id number] Dec 27, 1940  Procedure: Electrical Cardioversion Indications:  Atrial Fibrillation  Time Out: Verified patient identification, verified procedure,medications/allergies/relevent history reviewed, required imaging and test results available.  Performed  Procedure Details  The patient was NPO after midnight. Anesthesia was administered at the beside  by Dr.Germoroth with '100mg'$  of Lidocaine and '60mg'$  of propofol.  Cardioversion was done with synchronized biphasic defibrillation with AP pads with 150watts.  The patient failed to convert to normal sinus rhythm.  Cardioversion was done with synchronized biphasic defibrillation with AP pads with 200watts. The patient converted to NSR.   The patient tolerated the procedure well   IMPRESSION:  Successful cardioversion of atrial fibrillation    [name] R 04/12/2015, 10:11 AM

## 2015-04-12 NOTE — Transfer of Care (Signed)
Immediate Anesthesia Transfer of Care Note  Patient: [name]  Procedure(s) Performed: Procedure(s): CARDIOVERSION (N/A)  Patient Location: Endoscopy Unit  Anesthesia Type:MAC  Level of Consciousness: awake  Airway & Oxygen Therapy: Patient Spontanous Breathing  Post-op Assessment: Report given to RN  Post vital signs: Reviewed and stable  Last Vitals:  Filed Vitals:   04/12/15 1022  BP: 111/62  Pulse:   Temp:   Resp: 22    Complications: No apparent anesthesia complications

## 2015-04-12 NOTE — Anesthesia Preprocedure Evaluation (Addendum)
Anesthesia Evaluation  Patient identified by MRN, date of birth, ID band Patient awake    Reviewed: Allergy & Precautions, NPO status , Patient's Chart, lab work & pertinent test results, reviewed documented beta blocker date and time   Airway Mallampati: I  TM Distance: >3 FB Neck ROM: Full    Dental  (+) Upper Dentures, Lower Dentures   Pulmonary sleep apnea and Continuous Positive Airway Pressure Ventilation , former smoker,  breath sounds clear to auscultation        Cardiovascular hypertension, Pt. on medications and Pt. on home beta blockers + CAD, + Past MI and + Peripheral Vascular Disease + dysrhythmias Atrial Fibrillation Rhythm:Irregular     Neuro/Psych    GI/Hepatic GERD-  ,  Endo/Other  diabetes, Type 2, Oral Hypoglycemic Agents  Renal/GU      Musculoskeletal  (+) Arthritis -,   Abdominal   Peds  Hematology   Anesthesia Other Findings   Reproductive/Obstetrics                           Anesthesia Physical Anesthesia Plan  ASA: III  Anesthesia Plan: General   Post-op Pain Management:    Induction: Intravenous  Airway Management Planned: Mask  Additional Equipment:   Intra-op Plan:   Post-operative Plan:   Informed Consent: I have reviewed the patients History and Physical, chart, labs and discussed the procedure including the risks, benefits and alternatives for the proposed anesthesia with the patient or authorized representative who has indicated his/her understanding and acceptance.   Dental advisory given  Plan Discussed with: CRNA  Anesthesia Plan Comments:         Anesthesia Quick Evaluation

## 2015-04-12 NOTE — Anesthesia Postprocedure Evaluation (Signed)
  Anesthesia Post-op Note  Patient: [name]  Procedure(s) Performed: Procedure(s): CARDIOVERSION (N/A)  Patient Location: Endoscopy Unit  Anesthesia Type:MAC  Level of Consciousness: awake and oriented  Airway and Oxygen Therapy: Patient Spontanous Breathing  Post-op Pain: none  Post-op Assessment: Post-op Vital signs reviewed, Patient's Cardiovascular Status Stable and Respiratory Function Stable              Post-op Vital Signs: Reviewed and stable  Last Vitals:  Filed Vitals:   04/12/15 1022  BP: 111/62  Pulse:   Temp:   Resp: 22    Complications: No apparent anesthesia complications

## 2015-04-12 NOTE — H&P (View-Only) (Signed)
Cardiology Office Note   Date:  04/11/2015   ID:  [name], DOB June 17, 1941, MRN [id number]  PCP:  Irven Shelling, MD  Cardiologist:  Dr. Daneen Schick     Chief Complaint  Patient presents with  . Atrial Fibrillation     History of Present Illness: [name] is a 74 y.o. male with a hx of CAD status post prior inferior MI in 1993, PCI to the LAD in 05/1997, recurrent inferolateral MI complicated by V. fib arrest in 04/1998, prior PCI to OM1. Patient is known to have a chronic total occlusion of the RCA by cardiac catheterization in 10/2010. Other history includes ischemic cardiomyopathy, PAF, diabetes, HL, OSA, AAA status post repair 03/2008, lumbar spine surgery. Prior ejection fraction 25-30%. Most recent echocardiogram in April 2016 demonstrated improved LV function with an EF of 40-45%. Last seen by Dr. Tamala Julian 01/23/15. Patient called in today with symptoms of recurrent atrial fibrillation. He took extra beta blocker dose yesterday. This morning, he continued to feel that he was still in atrial fibrillation. He is added on for further evaluation.    He started feeling bad yesterday.  He just feels like something is not right. Denies any chest pain.  He denies any significant DOE.  He denies orthopnea, PND.  No edema.  No syncope or near syncope.  Does feel fatigued.  He took extra Metoprolol yesterday.  Slept ok last night.  Still noted elevated HR today and continued fatigue.  He has been adherent to Xarelto.  He has not missed a dose in over 6 mos.  He denies any bleeding problems.     Studies/Reports Reviewed Today:  Echo 02/01/15 EF 40-45%, diffuse HK, inferior HK, grade 1 diastolic dysfunction Mildly dilated aortic root (40 mm) Moderate LAE  LHC 11/26/10 LM: Patent LCx: Patent, OM1 stent patent then 40% LAD: Stent patent RCA: Proximal occlusion with left to right collaterals EF 40%  Past Medical History  Diagnosis Date  . Hypercholesteremia     takes lipitor daily    . AAA (abdominal aortic aneurysm)   . Pain     back pain chronic- seen at pain clinic  . Neuropathy     in legs  . Bronchitis     hx of  . Ileus   . Arthritis     BacK  . Incisional hernia     "small, from AAA"  . Coronary atherosclerosis of native coronary artery     ejection fraction of 40%, prior inferior myocardial infarction in February 1993, PTCA, LAD rotational atherectomy in August of 1998, acute inferolateral myocardial infarction, ventricular fibrillation in July of 1999, PTCA and stent of obtuse marginal 1, 100% RCA 1/12 with collaterals from patent circumflex and LAD  . Hypertension     takes Ramipril daily  . MI (myocardial infarction)     1992 and 1997 sees Dr. Linard Millers as needed   . Dysrhythmia     "got out of rhythym a couple , but it straightened back out."  . Sleep apnea     uses CPAP--sleep study done at least 73yr ago  . Chronic back pain   . Dry skin   . Acid reflux     takes Prilosec and Protonix daily  . Diabetes mellitus     takes Metformin daily  . Complication of anesthesia     -years ago hair fell out.;ileus after 2 of his surgeries    Past Surgical History  Procedure Laterality Date  .  Abdominal surgery    . Abdominal aortic aneurysm repair    . Colonoscopy    . Cholecystectomy    . Back surgery  2013    Miminal Invasive x2 in [place].  . Cardiac catheterization  2009/2012  . Coronary angioplasty      2 stents  . Coronary stent placement       Current Outpatient Prescriptions  Medication Sig Dispense Refill  . ACCU-CHEK FASTCLIX LANCETS MISC     . ACCU-CHEK SMARTVIEW test strip     . Coenzyme Q10 (CO Q 10) 100 MG CAPS Take 100 mg by mouth daily.    . diazepam (VALIUM) 5 MG tablet Take 5 mg by mouth at bedtime as needed (leg pain).     . finasteride (PROPECIA) 1 MG tablet Take 1 mg by mouth daily.     . furosemide (LASIX) 40 MG tablet Take 40 mg by mouth daily as needed (swelling).   1  . gabapentin (NEURONTIN) 600 MG tablet Take  600 mg by mouth 3 (three) times daily.    [name] glimepiride (AMARYL) 2 MG tablet Take 2 mg by mouth daily.    [name] KLOR-CON M10 10 MEQ tablet Take 10 mEq by mouth as needed.  1  . loperamide (IMODIUM) 2 MG capsule Take 1 capsule (2 mg total) by mouth every 6 (six) hours as needed for diarrhea or loose stools. 15 capsule 0  . metFORMIN (GLUCOPHAGE) 1000 MG tablet Take 1,000 mg by mouth 2 (two) times daily with a meal.    . metoprolol (LOPRESSOR) 50 MG tablet TAKE ONE TABLET BY MOUTH TWICE DAILY 60 tablet 10  . Multiple Vitamin (MULTIVITAMIN WITH MINERALS) TABS Take 1 tablet by mouth daily.    [name] omeprazole (PRILOSEC) 20 MG capsule Take 20 mg by mouth daily.    [name] oxyCODONE-acetaminophen (PERCOCET/ROXICET) 5-325 MG per tablet as needed.    . pregabalin (LYRICA) 75 MG capsule Take 75 mg by mouth 2 (two) times daily.    . ramipril (ALTACE) 10 MG capsule Take 10 mg by mouth daily.    . rivaroxaban (XARELTO) 20 MG TABS tablet Take 1 tablet (20 mg total) by mouth daily with supper. 30 tablet 3  . tamsulosin (FLOMAX) 0.4 MG CAPS capsule Take 1 capsule (0.4 mg total) by mouth daily. 30 capsule 0  . vitamin B-12 (CYANOCOBALAMIN) 1000 MCG tablet Take 1,000 mcg by mouth daily.     . cefUROXime (CEFTIN) 500 MG tablet Take 1 tablet (500 mg total) by mouth 2 (two) times daily with a meal. (Patient not taking: Reported on 04/11/2015) 8 tablet 0  . [DISCONTINUED] promethazine (PHENERGAN) 25 MG tablet Take 25 mg by mouth every 6 (six) hours as needed.     No current facility-administered medications for this visit.    Allergies:   Cymbalta; Lyrica; and Spinach    Social History:  The patient  reports that he quit smoking about 23 years ago. He has never used smokeless tobacco. He reports that he drinks alcohol. He reports that he does not use illicit drugs.   Family History:  The patient's family history includes Arthritis in his father; Coronary artery disease in his father and mother; Diabetes in his father and  mother; Rheum arthritis in his father.    ROS:   Please see the history of present illness.   Review of Systems  Musculoskeletal: Positive for back pain and myalgias.  Gastrointestinal: Positive for diarrhea.  All other systems reviewed and are negative.  PHYSICAL EXAM: VS:  BP 94/60 mmHg  Pulse 101  Ht 6' (1.829 m)  Wt 218 lb 12.8 oz (99.247 kg)  BMI 29.67 kg/m2    Wt Readings from Last 3 Encounters:  04/11/15 218 lb 12.8 oz (99.247 kg)  01/23/15 215 lb 12.8 oz (97.886 kg)  12/14/14 217 lb 3.3 oz (98.523 kg)     GEN: Well nourished, well developed, in no acute distress HEENT: normal Neck: no JVD, no masses Cardiac:  Normal S1/S2, irregularly irregular rhythm; no murmur ,  no rubs or gallops, no edema   Respiratory:  clear to auscultation bilaterally, no wheezing, rhonchi or rales. GI: soft, nontender, nondistended, + BS MS: no deformity or atrophy Skin: warm and dry  Neuro:  CNs II-XII intact, Strength and sensation are intact Psych: Normal affect   EKG:  EKG is ordered today.  It demonstrates:   Atrial fibrillation, HR 101, inferior Q waves, PVC   Recent Labs: 12/12/2014: ALT 25; TSH 0.317* 12/13/2014: BUN 9; Creatinine, Ser 0.77; Hemoglobin 12.8*; Magnesium 1.6; Platelets 105*; Potassium 4.2; Sodium 138    Lipid Panel    Component Value Date/Time   CHOL  11/26/2010 0750    147        ATP III CLASSIFICATION:  <200     mg/dL   Desirable  200-239  mg/dL   Borderline High  >=240    mg/dL   High          TRIG 132 11/26/2010 0750   HDL 29* 11/26/2010 0750   CHOLHDL 5.1 11/26/2010 0750   VLDL 26 11/26/2010 0750   LDLCALC  11/26/2010 0750    92        Total Cholesterol/HDL:CHD Risk Coronary Heart Disease Risk Table                     Men   Women  1/2 Average Risk   3.4   3.3  Average Risk       5.0   4.4  2 X Average Risk   9.6   7.1  3 X Average Risk  23.4   11.0        Use the calculated Patient Ratio above and the CHD Risk Table to determine the  patient's CHD Risk.        ATP III CLASSIFICATION (LDL):  <100     mg/dL   Optimal  100-129  mg/dL   Near or Above                    Optimal  130-159  mg/dL   Borderline  160-189  mg/dL   High  >190     mg/dL   Very High      ASSESSMENT AND PLAN:  PAF (paroxysmal atrial fibrillation):  He is back in AFib.  He has converted on his own in the past. He has never had DCCV. He is symptomatic with AFib.  HR is marginally controlled with HR 101 today.  He would definitely feel better in NSR.  He has been adherent to his oral anticoagulation therapy with Xarelto. I have recommended proceeding with DCCV.  We can do this without a TEE as he has been anticoagulated for > 1 month and symptoms started < 24 hours ago.  I reviewed this with Dr. Sherren Mocha (DOD) today who agreed.  The patient is eager to get it done today.  However, the schedule is full.  Will plan on DCCV tomorrow AM.  BMET, CBC will be obtained today.  FU with me or Dr. Daneen Schick after his DCCV. If he fails DCCV or has recurrent AFib, will need to consider anti-arrhythmic drug therapy.  Prior QTc ok.  He would probably be a good candidate for Tikosyn. Otherwise, would have to consider Amiodarone or Sotalol.    Coronary artery disease involving native coronary artery of native heart without angina pectoris:  No angina.  He is not on ASA as he is taking Xarelto. Continue beta-blocker, ACE inhibitor, statin.   Chronic combined systolic and diastolic heart failure:   Volume appears stable.    Ischemic cardiomyopathy:  Continue beta-blocker, ACE inhibitor and Lasix (takes prn).    Essential hypertension:  BP running a little low today.  I have asked him to hold the Ramipril until after his DCCV.    Hyperlipidemia:  Continue statin.  Diabetes:  Hold Amaryl while NPO.    Current medicines are reviewed at length with the patient today.  Concerns regarding medicines are as outlined above.  The following changes have been made:    As  above  Labs/ tests ordered today include:   Orders Placed This Encounter  Procedures  . Basic Metabolic Panel (BMET)  . CBC w/Diff  . EKG 12-Lead    Disposition:   FU with Dr. Daneen Schick or me 2 weeks after DCCV.     Signed, Versie Starks, MHS 04/11/2015 12:37 PM    [facility] [place], [place], [place]  [address] Phone: [phone]; Fax: [phone]

## 2015-04-13 ENCOUNTER — Encounter (HOSPITAL_COMMUNITY): Payer: Self-pay | Admitting: Cardiology

## 2015-05-04 ENCOUNTER — Ambulatory Visit: Payer: Medicare Other | Admitting: Physician Assistant

## 2015-05-08 NOTE — Progress Notes (Signed)
Cardiology Office Note   Date:  05/09/2015   ID:  [name], DOB September 07, 1941, MRN [id number]  PCP:  Irven Shelling, MD  Cardiologist:  Sinclair Grooms, MD   Chief Complaint  Patient presents with  . Coronary atherosclerosis      History of Present Illness: [name] is a 74 y.o. male who presents for coronary artery disease, chronic combined systolic and diastolic heart failure, paroxysmal atrial fibrillation, long-term anticoagulation with Xarelto, sleep apnea, and essential hypertension.  [name] is limited because of spinal stenosis. He denies angina. No recent episodes of tachycardia palpitation. He has not had bleeding on anticoagulation. He denies orthopnea and PND.    Past Medical History  Diagnosis Date  . Hypercholesteremia     takes lipitor daily  . AAA (abdominal aortic aneurysm)   . Pain     back pain chronic- seen at pain clinic  . Neuropathy     in legs  . Bronchitis     hx of  . Ileus   . Arthritis     BacK  . Incisional hernia     "small, from AAA"  . Coronary atherosclerosis of native coronary artery     ejection fraction of 40%, prior inferior myocardial infarction in February 1993, PTCA, LAD rotational atherectomy in August of 1998, acute inferolateral myocardial infarction, ventricular fibrillation in July of 1999, PTCA and stent of obtuse marginal 1, 100% RCA 1/12 with collaterals from patent circumflex and LAD  . Hypertension     takes Ramipril daily  . MI (myocardial infarction)     1992 and 1997 sees Dr. Linard Millers as needed   . Dysrhythmia     "got out of rhythym a couple , but it straightened back out."  . Sleep apnea     uses CPAP--sleep study done at least 91yr ago  . Chronic back pain   . Dry skin   . Acid reflux     takes Prilosec and Protonix daily  . Diabetes mellitus     takes Metformin daily  . Complication of anesthesia     -years ago hair fell out.;ileus after 2 of his surgeries    Past Surgical History    Procedure Laterality Date  . Abdominal surgery    . Abdominal aortic aneurysm repair    . Colonoscopy    . Cholecystectomy    . Back surgery  2013    [place]  . Cardiac catheterization  2009/2012  . Coronary angioplasty      2 stents  . Coronary stent placement    . Cardioversion N/A 04/12/2015    Procedure: CARDIOVERSION;  Surgeon: TSueanne Margarita MD;  [facility];  Service: Cardiovascular;  Laterality: N/A;     Current Outpatient Prescriptions  Medication Sig Dispense Refill  . ACCU-CHEK FASTCLIX LANCETS MISC     . ACCU-CHEK SMARTVIEW test strip     . atorvastatin (LIPITOR) 10 MG tablet Take 10 mg by mouth daily.    . Coenzyme Q10 (CO Q 10) 100 MG CAPS Take 100 mg by mouth daily.    . diazepam (VALIUM) 5 MG tablet Take 5 mg by mouth at bedtime as needed (leg pain).     . furosemide (LASIX) 40 MG tablet Take 40 mg by mouth daily as needed (swelling).   1  . gabapentin (NEURONTIN) 600 MG tablet Take 600 mg by mouth 3 (three) times daily.    .Marland Kitchenglimepiride (AMARYL) 2  MG tablet Take 2 mg by mouth daily.    [name] loperamide (IMODIUM) 2 MG capsule Take 1 capsule (2 mg total) by mouth every 6 (six) hours as needed for diarrhea or loose stools. 15 capsule 0  . metFORMIN (GLUCOPHAGE) 1000 MG tablet Take 1,000 mg by mouth 2 (two) times daily with a meal.    . metoprolol (LOPRESSOR) 50 MG tablet TAKE ONE TABLET BY MOUTH TWICE DAILY 60 tablet 10  . Multiple Vitamin (MULTIVITAMIN WITH MINERALS) TABS Take 1 tablet by mouth daily.    [name] omeprazole (PRILOSEC) 20 MG capsule Take 20 mg by mouth daily.    [name] oxyCODONE-acetaminophen (PERCOCET/ROXICET) 5-325 MG per tablet Take 1 tablet by mouth every 6 (six) hours as needed for moderate pain.     [name] pyridOXINE (VITAMIN B-6) 100 MG tablet Take 100 mg by mouth daily.    . ramipril (ALTACE) 10 MG capsule Take 10 mg by mouth daily.    . rivaroxaban (XARELTO) 20 MG TABS tablet Take 1 tablet (20 mg total) by mouth daily with  supper. 30 tablet 3  . vitamin B-12 (CYANOCOBALAMIN) 1000 MCG tablet Take 5,000 mcg by mouth daily.     . [DISCONTINUED] promethazine (PHENERGAN) 25 MG tablet Take 25 mg by mouth every 6 (six) hours as needed.     No current facility-administered medications for this visit.    Allergies:   Cymbalta and Lyrica    Social History:  The patient  reports that he quit smoking about 23 years ago. He has never used smokeless tobacco. He reports that he drinks alcohol. He reports that he does not use illicit drugs.   Family History:  The patient's family history includes Arthritis in his father; Coronary artery disease in his father and mother; Diabetes in his father and mother; Rheum arthritis in his father.    ROS:  Please see the history of present illness.   Otherwise, review of systems are positive for back and calf pain. Unable to walk more than 50 yards without having to stop..   All other systems are reviewed and negative.    PHYSICAL EXAM: VS:  BP 134/68 mmHg  Pulse 75  Ht 6' (1.829 m)  Wt 100.245 kg (221 lb)  BMI 29.97 kg/m2 , BMI Body mass index is 29.97 kg/(m^2). GEN: Well nourished, well developed, in no acute distress HEENT: normal Neck: no JVD, carotid bruits, or masses Cardiac: RRR; no murmurs, rubs, or gallops,no edema  Respiratory:  clear to auscultation bilaterally, normal work of breathing GI: soft, nontender, nondistended, + BS MS: no deformity or atrophy Skin: warm and dry, no rash Neuro:  Strength and sensation are intact Psych: euthymic mood, full affect   EKG:  EKG is ordered today. The ekg ordered today demonstrates normal sinus rhythm, inferior Q waves. No change compared to prior.   Recent Labs: 12/12/2014: ALT 25; TSH 0.317* 12/13/2014: Magnesium 1.6 04/11/2015: BUN 16; Creatinine, Ser 0.77; Hemoglobin 14.1; Platelets 180.0; Potassium 4.7; Sodium 133*    Lipid Panel    Component Value Date/Time   CHOL  11/26/2010 0750    147        ATP III  CLASSIFICATION:  <200     mg/dL   Desirable  200-239  mg/dL   Borderline High  >=240    mg/dL   High          TRIG 132 11/26/2010 0750   HDL 29* 11/26/2010 0750   CHOLHDL 5.1 11/26/2010 0750   VLDL 26  11/26/2010 0750   [place]  11/26/2010 0750    92        Total Cholesterol/HDL:CHD Risk Coronary Heart Disease Risk Table                     Men   Women  1/2 Average Risk   3.4   3.3  Average Risk       5.0   4.4  2 X Average Risk   9.6   7.1  3 X Average Risk  23.4   11.0        Use the calculated Patient Ratio above and the CHD Risk Table to determine the patient's CHD Risk.        ATP III CLASSIFICATION (LDL):  <100     mg/dL   Optimal  100-129  mg/dL   Near or Above                    Optimal  130-159  mg/dL   Borderline  160-189  mg/dL   High  >190     mg/dL   Very High      Wt Readings from Last 3 Encounters:  05/09/15 100.245 kg (221 lb)  04/11/15 99.247 kg (218 lb 12.8 oz)  01/23/15 97.886 kg (215 lb 12.8 oz)      Other studies Reviewed: Additional studies/ records that were reviewed today include: . Review of the above records demonstrates:    ASSESSMENT AND PLAN:  1. Atherosclerosis of native coronary artery with other form of angina pectoris Asymptomatic  2. PAF (paroxysmal atrial fibrillation) Denies palpitations  3. Chronic combined systolic and diastolic heart failure No evidence of volume overload  4. Long term current use of anticoagulant therapy No bleeding  5. Essential hypertension Controlled  6. Sleep apnea Stable  7. AAA (abdominal aortic aneurysm) Status post repair    Current medicines are reviewed at length with the patient today.  The patient does not have concerns regarding medicines.  The following changes have been made:  no change  Labs/ tests ordered today include:   Orders Placed This Encounter  Procedures  . Basic metabolic panel  . CBC w/Diff  . Basic metabolic panel  . CBC w/Diff  . EKG 12-Lead      Disposition:   FU with HS in 1 year  Signed, Sinclair Grooms, MD  05/09/2015 12:00 PM    [facility], [place], [place]  [address] Phone: [phone]; Fax: [phone]

## 2015-05-09 ENCOUNTER — Ambulatory Visit (INDEPENDENT_AMBULATORY_CARE_PROVIDER_SITE_OTHER): Payer: Medicare Other | Admitting: *Deleted

## 2015-05-09 ENCOUNTER — Encounter: Payer: Self-pay | Admitting: Interventional Cardiology

## 2015-05-09 ENCOUNTER — Ambulatory Visit (INDEPENDENT_AMBULATORY_CARE_PROVIDER_SITE_OTHER): Payer: Medicare Other | Admitting: Interventional Cardiology

## 2015-05-09 VITALS — BP 134/68 | HR 75 | Ht 72.0 in | Wt 221.0 lb

## 2015-05-09 DIAGNOSIS — I10 Essential (primary) hypertension: Secondary | ICD-10-CM

## 2015-05-09 DIAGNOSIS — Z7901 Long term (current) use of anticoagulants: Secondary | ICD-10-CM

## 2015-05-09 DIAGNOSIS — I25118 Atherosclerotic heart disease of native coronary artery with other forms of angina pectoris: Secondary | ICD-10-CM | POA: Diagnosis not present

## 2015-05-09 DIAGNOSIS — G4730 Sleep apnea, unspecified: Secondary | ICD-10-CM

## 2015-05-09 DIAGNOSIS — I5042 Chronic combined systolic (congestive) and diastolic (congestive) heart failure: Secondary | ICD-10-CM

## 2015-05-09 DIAGNOSIS — I714 Abdominal aortic aneurysm, without rupture, unspecified: Secondary | ICD-10-CM

## 2015-05-09 DIAGNOSIS — I480 Paroxysmal atrial fibrillation: Secondary | ICD-10-CM

## 2015-05-09 LAB — CBC WITH DIFFERENTIAL/PLATELET
Basophils Absolute: 0 10*3/uL (ref 0.0–0.1)
Basophils Relative: 0.3 % (ref 0.0–3.0)
Eosinophils Absolute: 0 10*3/uL (ref 0.0–0.7)
Eosinophils Relative: 0.4 % (ref 0.0–5.0)
HCT: 42 % (ref 39.0–52.0)
Hemoglobin: 14.6 g/dL (ref 13.0–17.0)
Lymphocytes Relative: 26.6 % (ref 12.0–46.0)
Lymphs Abs: 1.3 10*3/uL (ref 0.7–4.0)
MCHC: 34.8 g/dL (ref 30.0–36.0)
MCV: 91.8 fl (ref 78.0–100.0)
Monocytes Absolute: 0.6 10*3/uL (ref 0.1–1.0)
Monocytes Relative: 11.8 % (ref 3.0–12.0)
Neutro Abs: 3 10*3/uL (ref 1.4–7.7)
Neutrophils Relative %: 60.9 % (ref 43.0–77.0)
Platelets: 167 10*3/uL (ref 150.0–400.0)
RBC: 4.58 Mil/uL (ref 4.22–5.81)
RDW: 13.5 % (ref 11.5–15.5)
WBC: 4.9 10*3/uL (ref 4.0–10.5)

## 2015-05-09 LAB — BASIC METABOLIC PANEL
BUN: 15 mg/dL (ref 6–23)
CO2: 29 mEq/L (ref 19–32)
Calcium: 10 mg/dL (ref 8.4–10.5)
Chloride: 102 mEq/L (ref 96–112)
Creatinine, Ser: 0.74 mg/dL (ref 0.40–1.50)
GFR: 109.25 mL/min (ref >60.00)
Glucose, Bld: 277 mg/dL — ABNORMAL HIGH (ref 70–99)
Potassium: 4.1 mEq/L (ref 3.5–5.1)
Sodium: 137 mEq/L (ref 135–145)

## 2015-05-09 NOTE — Progress Notes (Signed)
Pt was started on Xarelto for Afib on 08/13/2013.    Reviewed patients medication list.  Pt is not  currently on any combined P-gp and strong CYP3A4 inhibitors/inducers (ketoconazole, traconazole, ritonavir, carbamazepine, phenytoin, rifampin, St. John's wort).  Reviewed labs.  SCr 0.74, Weight 100.4Kg, CrCl- 120.  Dose appropriate based on CrCl.   Hgb and HCT 14.6/42.0.   A full discussion of the nature of anticoagulants has been carried out.  A benefit/risk analysis has been presented to the patient, so that they understand the justification for choosing anticoagulation with Xarelto at this time.  The need for compliance is stressed.  Pt is aware to take the medication once daily with the largest meal of the day.  Side effects of potential bleeding are discussed, including unusual colored urine or stools, coughing up blood or coffee ground emesis, nose bleeds or serious fall or head trauma.  Discussed signs and symptoms of stroke. The patient should avoid any OTC items containing aspirin or ibuprofen.  Avoid alcohol consumption.   Call if any signs of abnormal bleeding.  Discussed financial obligations and resolved any difficulty in obtaining medication.  Next lab test test in 6 months.

## 2015-05-09 NOTE — Patient Instructions (Addendum)
Medication Instructions:  Your physician recommends that you continue on your current medications as directed. Please refer to the Current Medication list given to you today.   Labwork: [facility], [place]  Your physician recommends that you return for lab work in: 6 months ([facility])  Testing/Procedures: None   Follow-Up: Your physician wants you to follow-up in: 1 year with Dr.Smith You will receive a reminder letter in the mail two months in advance. If you don't receive a letter, please call our office to schedule the follow-up appointment.   Any Other Special Instructions Will Be Listed Below (If Applicable).

## 2015-05-10 ENCOUNTER — Telehealth: Payer: Self-pay | Admitting: Interventional Cardiology

## 2015-05-10 NOTE — Telephone Encounter (Signed)
Returned pt call. Pt aware of lab results Labs are stable  Pt verbalized understanding.

## 2015-05-10 NOTE — Telephone Encounter (Signed)
New message   Patient calling back to speak with nurse - advise to call back this am

## 2015-05-12 ENCOUNTER — Ambulatory Visit
Admission: RE | Admit: 2015-05-12 | Discharge: 2015-05-12 | Disposition: A | Discharge status: Home / Self Care | Payer: Medicare Other | Source: Ambulatory Visit | Attending: Internal Medicine | Admitting: Internal Medicine

## 2015-05-12 ENCOUNTER — Other Ambulatory Visit: Payer: Self-pay | Admitting: Internal Medicine

## 2015-05-12 DIAGNOSIS — R0789 Other chest pain: Secondary | ICD-10-CM

## 2015-06-04 IMAGING — DX DG CHEST 1V PORT
1 series · 1 of 1 positions shown · non-contrast
Comparison: Radiograph 09/30/2012

CLINICAL DATA: Weakness, hypotension

EXAM:
PORTABLE CHEST 1 VIEW

[chest]
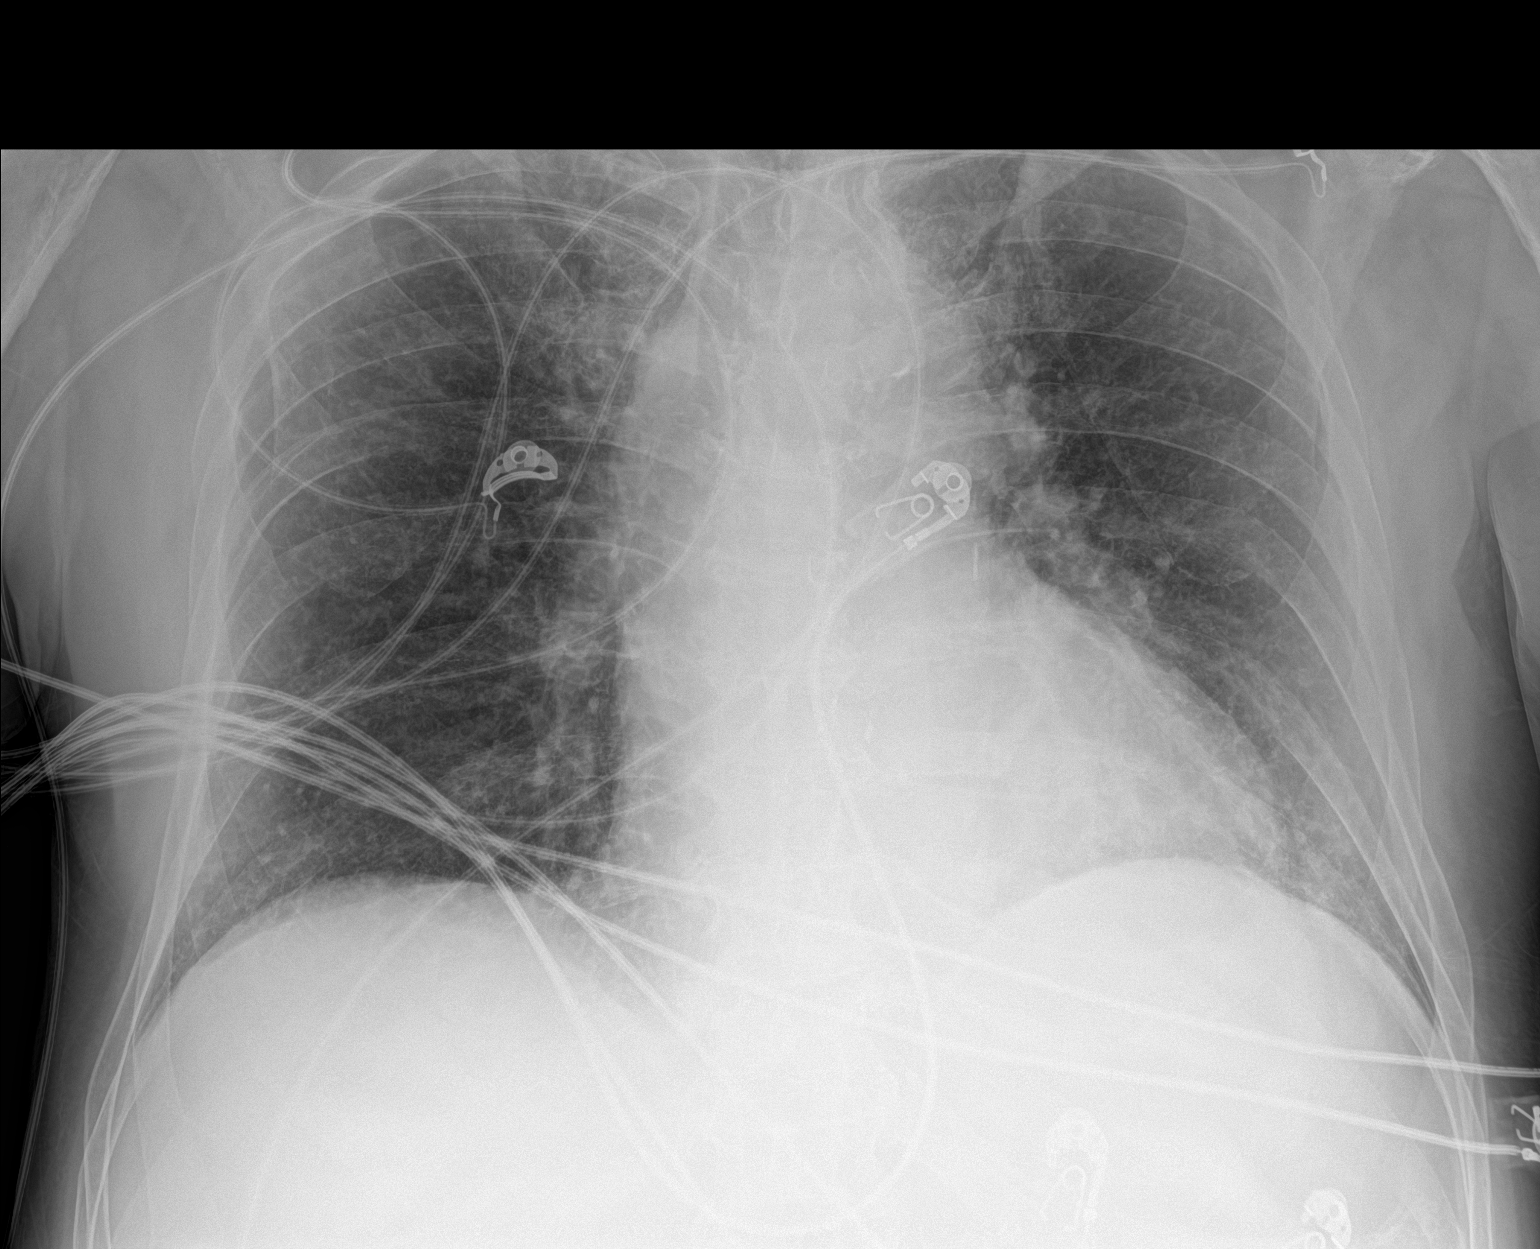

[1 of 1 positions shown; findings below may reference images not displayed]

FINDINGS: Increasing hazy interstitial opacities within the lungs cardiomegaly
and indistinct pulmonary vascularity may reflect mild interstitial
edema. No consolidative opacity, pneumothorax or effusion. The aorta
is calcified. The remaining cardiomediastinal contours are
unremarkable. No acute osseous or soft tissue abnormality.
Degenerative changes are present in the imaged spine and shoulders.
Telemetry leads overlie the chest.
IMPRESSION: Features compatible with CHF/volume overload with cardiomegaly and
interstitial opacities favoring edema.

## 2015-06-14 IMAGING — CR DG CHEST 2V
2 series · 2 of 2 positions shown · non-contrast
Comparison: Chest radiograph September 30, 2012

CLINICAL DATA: Chest pain, cough, history of bronchitis.

EXAM:
CHEST  2 VIEW

[w chest pa]
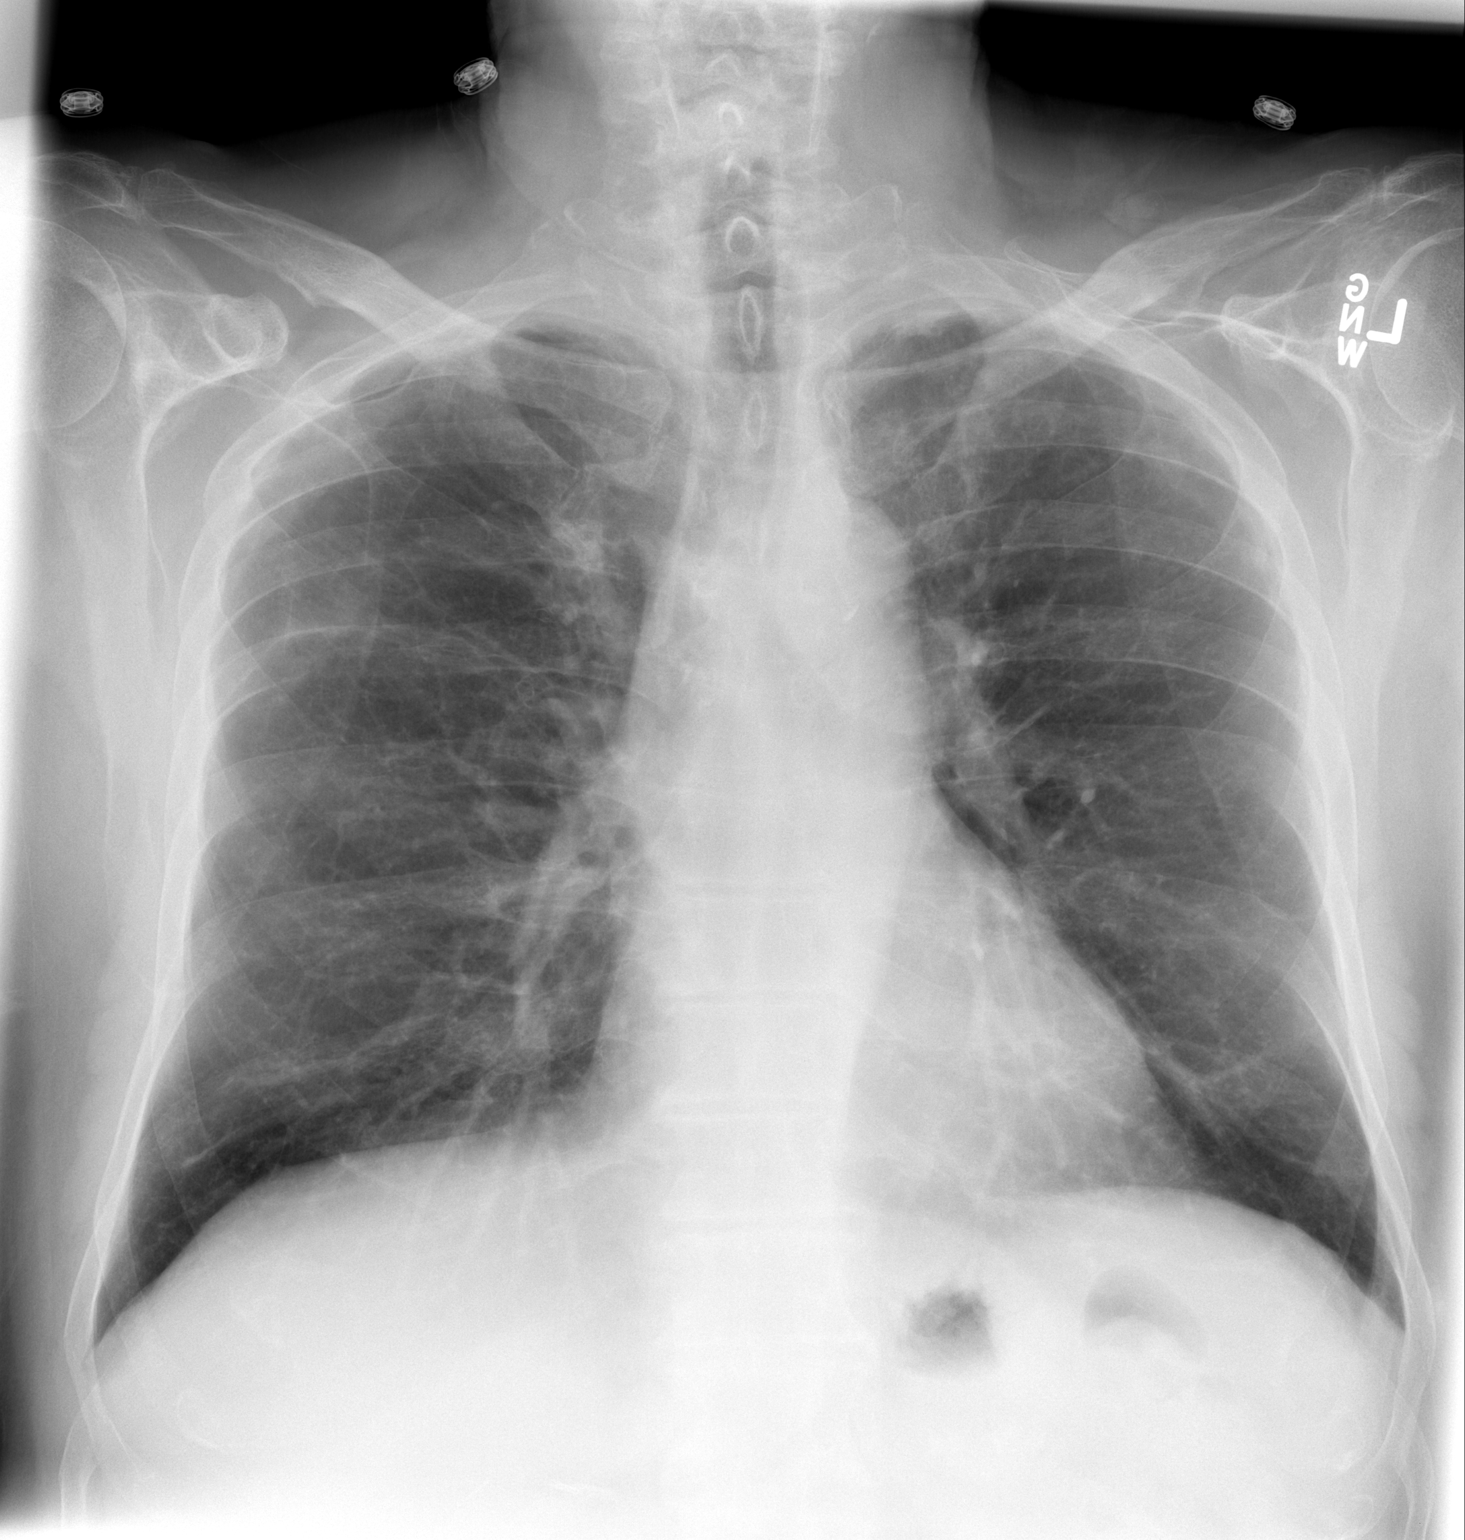

[w chest lat]
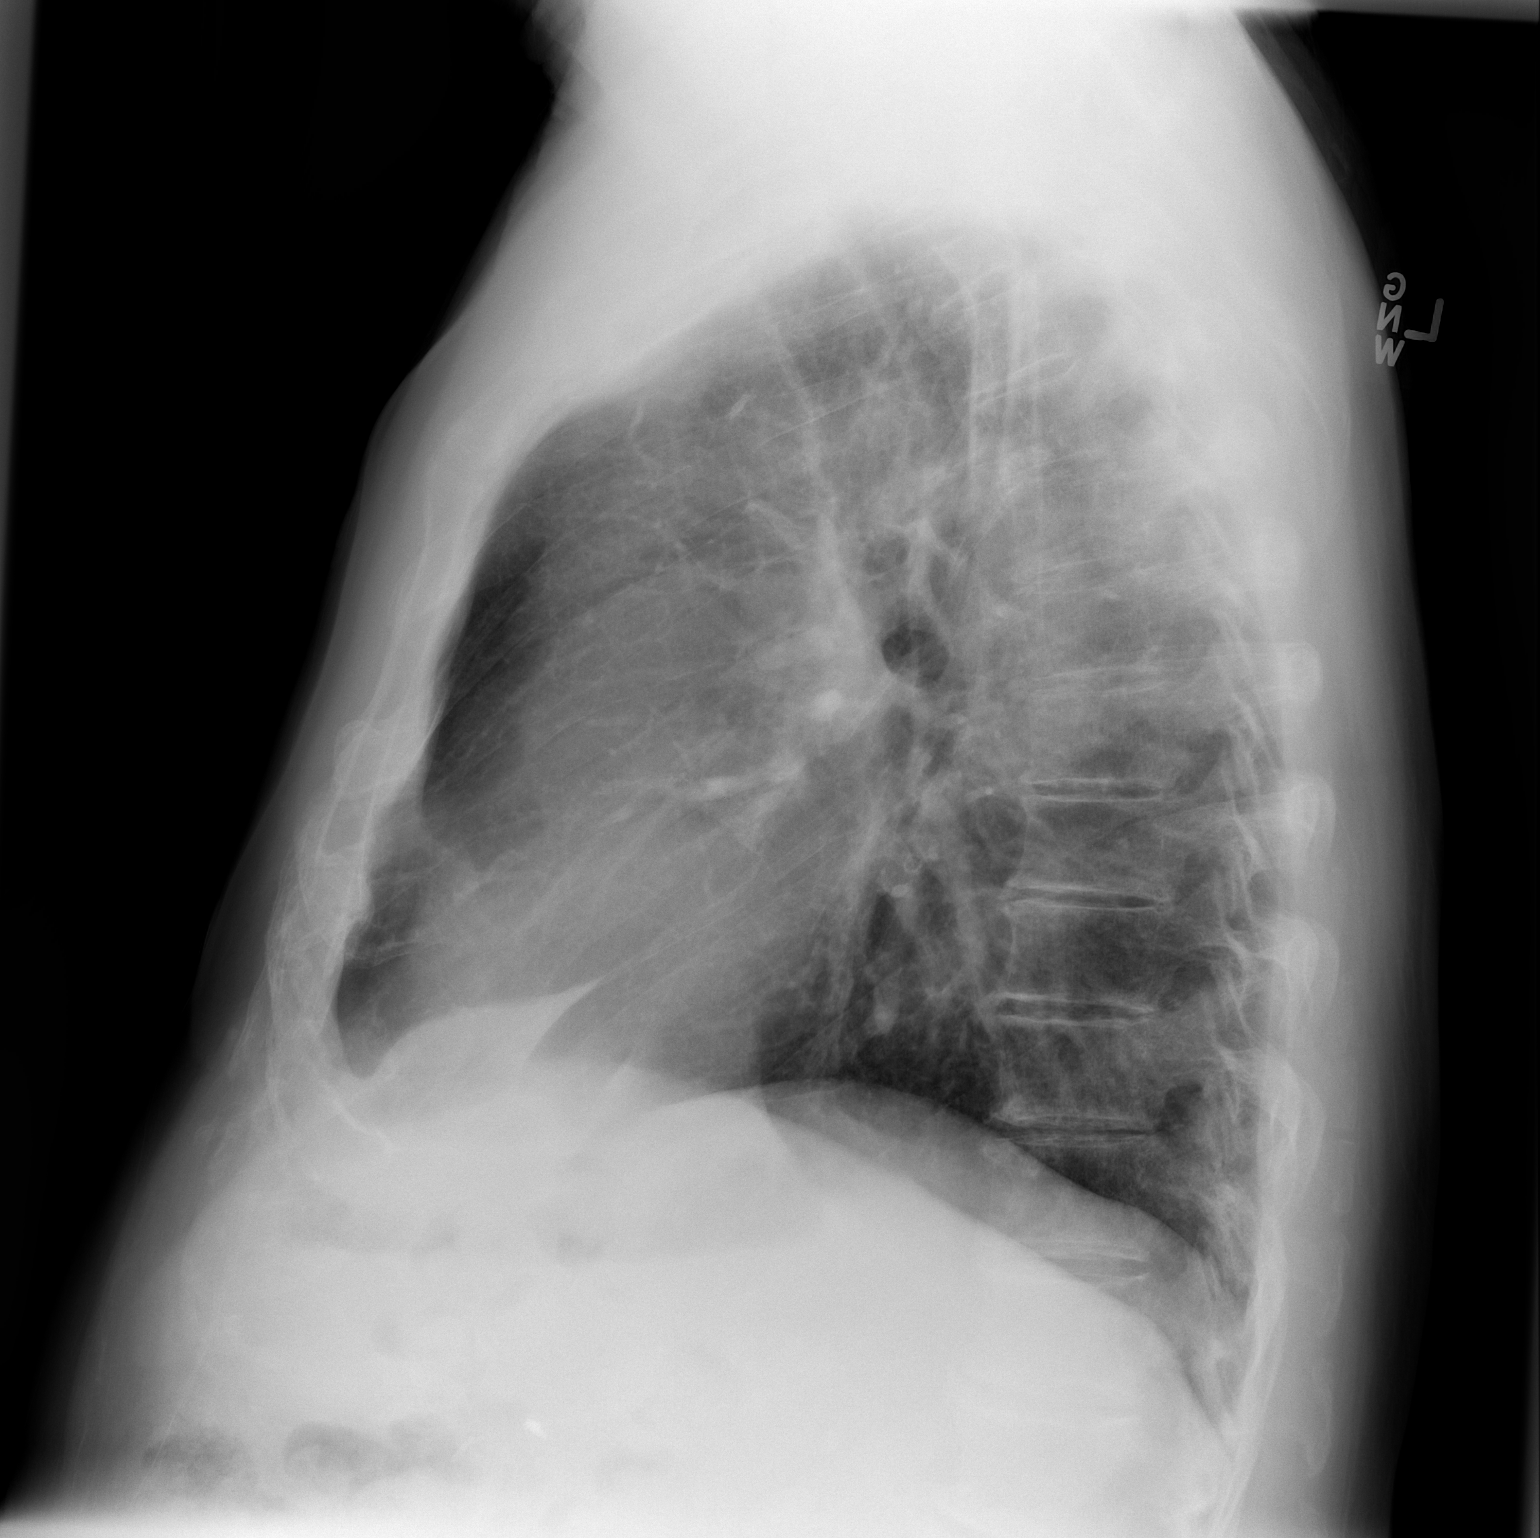

[2 of 2 positions shown; findings below may reference images not displayed]

FINDINGS: Cardiomediastinal silhouette is unremarkable and unchanged. Mild
chronic interstitial changes, no pleural effusions or focal
consolidations. Mild biapical thickening.

Surgical abdomen may reflect cholecystectomy. Mild degenerative
change of thoracic spine. Soft tissue planes are nonsuspicious.
IMPRESSION: Mild chronic interstitial changes without superimposed acute
cardiopulmonary process.

  By: Geniarjani Magajne

## 2015-06-29 ENCOUNTER — Emergency Department (HOSPITAL_COMMUNITY)
Admission: EM | Admit: 2015-06-29 | Discharge: 2015-06-29 | Disposition: A | Discharge status: Home / Self Care | Payer: Medicare Other | Attending: Emergency Medicine | Admitting: Emergency Medicine

## 2015-06-29 ENCOUNTER — Telehealth: Payer: Self-pay | Admitting: Interventional Cardiology

## 2015-06-29 ENCOUNTER — Encounter (HOSPITAL_COMMUNITY): Payer: Self-pay | Admitting: *Deleted

## 2015-06-29 ENCOUNTER — Emergency Department (HOSPITAL_COMMUNITY): Payer: Medicare Other

## 2015-06-29 DIAGNOSIS — Z9861 Coronary angioplasty status: Secondary | ICD-10-CM | POA: Insufficient documentation

## 2015-06-29 DIAGNOSIS — I480 Paroxysmal atrial fibrillation: Secondary | ICD-10-CM | POA: Diagnosis not present

## 2015-06-29 DIAGNOSIS — G4730 Sleep apnea, unspecified: Secondary | ICD-10-CM | POA: Diagnosis not present

## 2015-06-29 DIAGNOSIS — Z87891 Personal history of nicotine dependence: Secondary | ICD-10-CM | POA: Diagnosis not present

## 2015-06-29 DIAGNOSIS — Z8739 Personal history of other diseases of the musculoskeletal system and connective tissue: Secondary | ICD-10-CM | POA: Diagnosis not present

## 2015-06-29 DIAGNOSIS — R000 Tachycardia, unspecified: Secondary | ICD-10-CM | POA: Diagnosis present

## 2015-06-29 DIAGNOSIS — G8929 Other chronic pain: Secondary | ICD-10-CM | POA: Diagnosis not present

## 2015-06-29 DIAGNOSIS — K219 Gastro-esophageal reflux disease without esophagitis: Secondary | ICD-10-CM | POA: Diagnosis not present

## 2015-06-29 DIAGNOSIS — Z79899 Other long term (current) drug therapy: Secondary | ICD-10-CM | POA: Diagnosis not present

## 2015-06-29 DIAGNOSIS — I252 Old myocardial infarction: Secondary | ICD-10-CM | POA: Diagnosis not present

## 2015-06-29 DIAGNOSIS — E119 Type 2 diabetes mellitus without complications: Secondary | ICD-10-CM | POA: Diagnosis not present

## 2015-06-29 DIAGNOSIS — I2510 Atherosclerotic heart disease of native coronary artery without angina pectoris: Secondary | ICD-10-CM | POA: Diagnosis not present

## 2015-06-29 DIAGNOSIS — I10 Essential (primary) hypertension: Secondary | ICD-10-CM | POA: Insufficient documentation

## 2015-06-29 DIAGNOSIS — E780 Pure hypercholesterolemia: Secondary | ICD-10-CM | POA: Insufficient documentation

## 2015-06-29 DIAGNOSIS — Z7901 Long term (current) use of anticoagulants: Secondary | ICD-10-CM | POA: Diagnosis not present

## 2015-06-29 DIAGNOSIS — R110 Nausea: Secondary | ICD-10-CM | POA: Diagnosis not present

## 2015-06-29 DIAGNOSIS — Z8669 Personal history of other diseases of the nervous system and sense organs: Secondary | ICD-10-CM | POA: Insufficient documentation

## 2015-06-29 LAB — CBC
HCT: 37 % — ABNORMAL LOW (ref 39.0–52.0)
Hemoglobin: 13 g/dL (ref 13.0–17.0)
MCH: 31.8 pg (ref 26.0–34.0)
MCHC: 35.1 g/dL (ref 30.0–36.0)
MCV: 90.5 fL (ref 78.0–100.0)
Platelets: 132 10*3/uL — ABNORMAL LOW (ref 150–400)
RBC: 4.09 MIL/uL — ABNORMAL LOW (ref 4.22–5.81)
RDW: 13 % (ref 11.5–15.5)
WBC: 5.8 10*3/uL (ref 4.0–10.5)

## 2015-06-29 LAB — COMPREHENSIVE METABOLIC PANEL
ALT: 33 U/L (ref 17–63)
AST: 32 U/L (ref 15–41)
Albumin: 3.7 g/dL (ref 3.5–5.0)
Alkaline Phosphatase: 50 U/L (ref 38–126)
Anion gap: 9 (ref 5–15)
BUN: 13 mg/dL (ref 6–20)
CO2: 23 mmol/L (ref 22–32)
Calcium: 9.4 mg/dL (ref 8.9–10.3)
Chloride: 100 mmol/L — ABNORMAL LOW (ref 101–111)
Creatinine, Ser: 0.77 mg/dL (ref 0.61–1.24)
GFR calc Af Amer: >60 mL/min (ref >60)
GFR calc non Af Amer: >60 mL/min (ref >60)
Glucose, Bld: 207 mg/dL — ABNORMAL HIGH (ref 65–99)
Potassium: 4.3 mmol/L (ref 3.5–5.1)
Sodium: 132 mmol/L — ABNORMAL LOW (ref 135–145)
Total Bilirubin: 0.8 mg/dL (ref 0.3–1.2)
Total Protein: 6.2 g/dL — ABNORMAL LOW (ref 6.5–8.1)

## 2015-06-29 LAB — TROPONIN I: Troponin I: <0.03 ng/mL (ref <0.031)

## 2015-06-29 MED ORDER — RIVAROXABAN 20 MG PO TABS
20.0000 mg | ORAL_TABLET | Freq: Once | ORAL | Status: AC
  Administered 2015-06-29: 20 mg via ORAL
  Filled 2015-06-29: qty 1

## 2015-06-29 MED ORDER — DILTIAZEM HCL 100 MG IV SOLR
5.0000 mg/h | INTRAVENOUS | Status: DC
  Administered 2015-06-29: 5 mg/h via INTRAVENOUS
  Filled 2015-06-29: qty 100

## 2015-06-29 MED ORDER — RIVAROXABAN 15 MG PO TABS
15.0000 mg | ORAL_TABLET | Freq: Once | ORAL | Status: DC
Start: 2015-06-29 — End: 2015-06-29

## 2015-06-29 MED ORDER — DILTIAZEM LOAD VIA INFUSION
15.0000 mg | Freq: Once | INTRAVENOUS | Status: AC
  Filled 2015-06-29: qty 15

## 2015-06-29 MED ORDER — ETOMIDATE 2 MG/ML IV SOLN
20.0000 mg | Freq: Once | INTRAVENOUS | Status: AC
  Administered 2015-06-29: 20 mg via INTRAVENOUS
  Filled 2015-06-29: qty 10

## 2015-06-29 NOTE — ED Provider Notes (Signed)
CSN: [id number]     Arrival date & time 06/29/15  0025 History  This chart was scribed for Jola Schmidt, MD by Meriel Pica, ED Scribe. This patient was seen in room A03C/A03C and the patient's care was started 1:25 AM.   Chief Complaint  Patient presents with  . Atrial Fibrillation   The history is provided by the EMS personnel and the patient. No language interpreter was used.   HPI Comments: [name] is a 74 y.o. male, with a PMhx of HTN, chest tightness, MI, and Afib, brought in by ambulance, who presents to the Emergency Department complaining of sudden onset, constant, moderate Afib symptoms onset 3 hours ago. Pt reports hypertension, tachycardia, chest tightness, nausea and dizziness onset 3 hours ago. Pt notes he felt at baseline upon waking up this morning but as the day went on he began to feel uncomfortable and felt his pulse increase. He reports he was getting ready for bed approximately 3 hours ago when he began to feel dizzy, nauseous, and an uncomfortable chest tightness. He then checked his vitals and found his HR to be 147 and his BP to be 187/27mHg which prompted his EMS call out. Pt notes a history of Afib 3 months ago for which electrical cardioversion was performed in the ED. Pt is on 20 mg Xarelto. He missed this evenings dose of Xarelto due to his symptoms. Pt notes he feels 'pretty good' currently. Pt given '10mg'$  Cardizem and '4mg'$  Zofran en route by EMS. Pt is on CPAP at home.   Past Medical History  Diagnosis Date  . Hypercholesteremia     takes lipitor daily  . AAA (abdominal aortic aneurysm)   . Pain     back pain chronic- seen at pain clinic  . Neuropathy     in legs  . Bronchitis     hx of  . Ileus   . Arthritis     BacK  . Incisional hernia     "small, from AAA"  . Coronary atherosclerosis of native coronary artery     ejection fraction of 40%, prior inferior myocardial infarction in February 1993, PTCA, LAD rotational atherectomy in August of 1998,  acute inferolateral myocardial infarction, ventricular fibrillation in July of 1999, PTCA and stent of obtuse marginal 1, 100% RCA 1/12 with collaterals from patent circumflex and LAD  . Hypertension     takes Ramipril daily  . MI (myocardial infarction)     1992 and 1997 sees Dr. HLinard Millersas needed   . Dysrhythmia     "got out of rhythym a couple , but it straightened back out."  . Sleep apnea     uses CPAP--sleep study done at least 146yrago  . Chronic back pain   . Dry skin   . Acid reflux     takes Prilosec and Protonix daily  . Diabetes mellitus     takes Metformin daily  . Complication of anesthesia     -years ago hair fell out.;ileus after 2 of his surgeries   Past Surgical History  Procedure Laterality Date  . Abdominal surgery    . Abdominal aortic aneurysm repair    . Colonoscopy    . Cholecystectomy    . Back surgery  2013    [place] Cardiac catheterization  2009/2012  . Coronary angioplasty      2 stents  . Coronary stent placement    . Cardioversion N/A 04/12/2015  Procedure: CARDIOVERSION;  Surgeon: Sueanne Margarita, MD;  Location: [facility];  Service: Cardiovascular;  Laterality: N/A;   Family History  Problem Relation Age of Onset  . Diabetes Mother   . Coronary artery disease Mother   . Diabetes Father   . Arthritis Father   . Rheum arthritis Father   . Coronary artery disease Father    Social History  Substance Use Topics  . Smoking status: Former Smoker    Quit date: 01/01/1992  . Smokeless tobacco: Never Used  . Alcohol Use: Yes     Comment: rarely    Review of Systems  Respiratory: Positive for chest tightness.   Gastrointestinal: Positive for nausea.  Neurological: Positive for dizziness.  All other systems reviewed and are negative.  Allergies  Cymbalta and Lyrica  Home Medications   Prior to Admission medications   Medication Sig Start Date End Date Taking? Authorizing Provider  atorvastatin  (LIPITOR) 10 MG tablet Take 10 mg by mouth daily.   Yes Historical Provider, MD  Coenzyme Q10 (CO Q 10) 100 MG CAPS Take 100 mg by mouth daily.   Yes Historical Provider, MD  gabapentin (NEURONTIN) 600 MG tablet Take 600 mg by mouth 3 (three) times daily. 01/17/15  Yes Historical Provider, MD  glimepiride (AMARYL) 2 MG tablet Take 2 mg by mouth daily. 01/10/15  Yes Historical Provider, MD  metFORMIN (GLUCOPHAGE) 1000 MG tablet Take 1,000 mg by mouth 2 (two) times daily with a meal.   Yes Historical Provider, MD  metoprolol (LOPRESSOR) 50 MG tablet TAKE ONE TABLET BY MOUTH TWICE DAILY 04/05/15  Yes Belva Crome, MD  Multiple Vitamin (MULTIVITAMIN WITH MINERALS) TABS Take 1 tablet by mouth daily.   Yes Historical Provider, MD  omeprazole (PRILOSEC) 20 MG capsule Take 20 mg by mouth daily. 09/14/12  Yes Historical Provider, MD  pyridOXINE (VITAMIN B-6) 100 MG tablet Take 100 mg by mouth daily.   Yes Historical Provider, MD  ramipril (ALTACE) 10 MG capsule Take 10 mg by mouth daily. 11/21/14  Yes Historical Provider, MD  rivaroxaban (XARELTO) 20 MG TABS tablet Take 1 tablet (20 mg total) by mouth daily with supper. 11/22/14  Yes Belva Crome, MD  vitamin B-12 (CYANOCOBALAMIN) 1000 MCG tablet Take 5,000 mcg by mouth daily.    Yes Historical Provider, MD  ACCU-CHEK FASTCLIX LANCETS [place]  07/13/13   Historical Provider, MD  ACCU-CHEK SMARTVIEW test strip  07/30/13   Historical Provider, MD  loperamide (IMODIUM) 2 MG capsule Take 1 capsule (2 mg total) by mouth every 6 (six) hours as needed for diarrhea or loose stools. Patient not taking: Reported on 06/29/2015 12/14/14   Thurnell Lose, MD   BP 109/70 mmHg  Pulse 88  Temp(Src) 98.2 F (36.8 C) (Oral)  Resp 14  SpO2 95% Physical Exam  Constitutional: He is oriented to person, place, and time. He appears well-developed and well-nourished.  HENT:  Head: Normocephalic and atraumatic.  Eyes: EOM are normal.  Neck: Normal range of motion.  Cardiovascular:  Normal rate, normal heart sounds and intact distal pulses.   Irregularly irregular rhythm.   Pulmonary/Chest: Effort normal and breath sounds normal. No respiratory distress.  Abdominal: Soft. He exhibits no distension. There is no tenderness.  Musculoskeletal: Normal range of motion.  Neurological: He is alert and oriented to person, place, and time.  Skin: Skin is warm and dry.  Psychiatric: He has a normal mood and affect. Judgment normal.  Nursing note and vitals reviewed.  ED Course  CARDIOVERSION Performed by: Jola Schmidt Authorized by: Jola Schmidt Consent: Verbal consent obtained. Written consent obtained. Risks and benefits: risks, benefits and alternatives were discussed Consent given by: patient and spouse Required items: required blood products, implants, devices, and special equipment available Patient identity confirmed: verbally with patient Time out: Immediately prior to procedure a "time out" was called to verify the correct patient, procedure, equipment, support staff and site/side marked as required. Patient sedated: yes Sedatives: etomidate Cardioversion basis: elective Indications: failure of anti-arrhythmic medications Pre-procedure rhythm: atrial fibrillation Patient position: patient was placed in a supine position Chest area: chest area exposed Electrodes: pads Electrodes placed: anterior-posterior Number of attempts: 2 Attempt 1 mode: synchronous Attempt 1 waveform: biphasic Attempt 1 shock (in Joules): 120 Attempt 1 outcome: no change in rhythm Attempt 2 mode: synchronous Attempt 2 waveform: biphasic Attempt 2 shock (in Joules): 150 Attempt 2 outcome: conversion to normal sinus rhythm Post-procedure rhythm: normal sinus rhythm Patient tolerance: Patient tolerated the procedure well with no immediate complications    Procedural sedation Performed by: Hoy Morn Consent: Verbal consent obtained. Risks and benefits: risks, benefits and  alternatives were discussed Required items: required blood products, implants, devices, and special equipment available Patient identity confirmed: arm band and provided demographic data Time out: Immediately prior to procedure a "time out" was called to verify the correct patient, procedure, equipment, support staff and site/side marked as required. Sedation type: moderate (conscious) sedation NPO time confirmed and considedered Sedatives: ETOMIDATE Physician Time at Bedside: 15 Vitals: Vital signs were monitored during sedation. Cardiac Monitor, pulse oximeter Patient tolerance: Patient tolerated the procedure well with no immediate complications. Comments: Pt with uneventful recovered. Returned to pre-procedural sedation baseline   DIAGNOSTIC STUDIES: Oxygen Saturation is 95% on RA, adequate by my interpretation.    COORDINATION OF CARE: 1:38 AM Discussed treatment plan with pt. Will order Cardizem and Xarelto '20mg'$ . Pt acknowledges and agrees to plan.  3:40 AM Performed cardioversion. Pt acknowledged and agreed to plan.   Labs Review Labs Reviewed  CBC - Abnormal; Notable for the following:    RBC 4.09 (*)    HCT 37.0 (*)    Platelets 132 (*)    All other components within normal limits  COMPREHENSIVE METABOLIC PANEL - Abnormal; Notable for the following:    Sodium 132 (*)    Chloride 100 (*)    Glucose, Bld 207 (*)    Total Protein 6.2 (*)    All other components within normal limits  TROPONIN I    Imaging Review Dg Chest 2 View  06/29/2015   CLINICAL DATA:  Acute onset of generalized chest tightness and tachycardia. Initial encounter.  EXAM: CHEST  2 VIEW  COMPARISON:  Chest radiograph performed 05/12/2015  FINDINGS: The lungs are well-aerated and clear. There is no evidence of focal opacification, pleural effusion or pneumothorax.  The heart is normal in size; the mediastinal contour is within normal limits. No acute osseous abnormalities are seen.  IMPRESSION: No acute  cardiopulmonary process seen.   Electronically Signed   By: Garald Balding M.D.   On: 06/29/2015 02:04   I have personally reviewed and evaluated these images and lab results as part of my medical decision-making.   EKG Interpretation   Date/Time:  Thursday June 29 2015 00:55:32 EDT Ventricular Rate:  102 PR Interval:    QRS Duration: 127 QT Interval:  370 QTC Calculation: 482 R Axis:   -47 Text Interpretation:  Atrial fibrillation Ventricular premature complex  Nonspecific  IVCD with LAD Inferior infarct, old Abnormal lateral Q waves  now in atrial fibrillation Confirmed by Belmira Daley  MD, Lennette Bihari ([id number]) on  06/29/2015 1:28:31 AM      ECG interpretation#2   Date: 06/29/2015  Rate: 80  Rhythm: normal sinus rhythm  QRS Axis: normal  Intervals: normal  ST/T Wave abnormalities: normal  Conduction Disutrbances: none  Narrative Interpretation:   Old EKG Reviewed: no longer in atrial fibrillation   MDM   Final diagnoses:  Paroxysmal atrial fibrillation    Patient with paroxysmal A. fib on anticoagulation.  Normally in sinus rhythm.  Initially attempted Cardizem without conversion of his rhythm.  He is on anticoagulation.  His evening dose of Xarelto was given in the emergency department.  He was cardioverted successfully back in the sinus rhythm.  He was monitored in the emergency department.  He has ambulated without issues.  He remains in normal sinus rhythm.  Cardiology follow-up.  I personally performed the services described in this documentation, which was scribed in my presence. The recorded information has been reviewed and is accurate.      Jola Schmidt, MD 06/29/15 [phone]

## 2015-06-29 NOTE — Telephone Encounter (Signed)
New Message  Pt calling to speak w/ RN concerning recent cardioversion done at [facility]. Please call back and discuss.

## 2015-06-29 NOTE — ED Notes (Signed)
Synchronized cardioversion at 150.

## 2015-06-29 NOTE — ED Notes (Signed)
Per [facility] - pt from home, presents to ED w/ c/o hypertension and chest tightness - pt found to be in a-fib w/ HR between 120-180, pt w/ hx of a-fib and cardioversion, pt administered '10mg'$  cardizem en route as well as '4mg'$  zofran d/t nausea. Pt admits to mild shortness of breath, denies vomiting. At home BP of 210/140, HR down to 80-110 s/p cardizem. Pt A&Ox4 on arrival, no acute distress noted.

## 2015-06-29 NOTE — ED Notes (Signed)
Dr. Campos at the bedside.  

## 2015-06-29 NOTE — ED Notes (Signed)
Respiratory present for cardioversion.

## 2015-06-29 NOTE — Sedation Documentation (Signed)
Patient now in sinus rhythm, another bolus of '10mg'$  of cardizem given from IV bag with verbal order from dr. Venora Maples.

## 2015-06-29 NOTE — Discharge Instructions (Signed)
Electrical Cardioversion °Electrical cardioversion is the delivery of a jolt of electricity to change the rhythm of the heart. Sticky patches or metal paddles are placed on the chest to deliver the electricity from a device. This is done to restore a normal rhythm. A rhythm that is too fast or not regular keeps the heart from pumping well. °Electrical cardioversion is done in an emergency if:  °· There is low or no blood pressure as a result of the heart rhythm.   °· Normal rhythm must be restored as fast as possible to protect the brain and heart from further damage.   °· It may save a life. °Cardioversion may be done for heart rhythms that are not immediately life threatening, such as atrial fibrillation or flutter, in which:  °· The heart is beating too fast or is not regular.   °· Medicine to change the rhythm has not worked.   °· It is safe to wait in order to allow time for preparation. °· Symptoms of the abnormal rhythm are bothersome. °· The risk of stroke and other serious problems can be reduced. °LET YOUR HEALTH CARE PROVIDER KNOW ABOUT:  °· Any allergies you have. °· All medicines you are taking, including vitamins, herbs, eye drops, creams, and over-the-counter medicines. °· Previous problems you or members of your family have had with the use of anesthetics.   °· Any blood disorders you have.   °· Previous surgeries you have had.   °· Medical conditions you have. °RISKS AND COMPLICATIONS  °Generally, this is a safe procedure. However, problems can occur and include:  °· Breathing problems related to the anesthetic used. °· A blood clot that breaks free and travels to other parts of your body. This could cause a stroke or other problems. The risk of this is lowered by use of blood-thinning medicine (anticoagulant) prior to the procedure. °· Cardiac arrest (rare). °BEFORE THE PROCEDURE  °· You may have tests to detect blood clots in your heart and to evaluate heart function.  °· You may start taking  anticoagulants so your blood does not clot as easily.   °· Medicines may be given to help stabilize your heart rate and rhythm. °PROCEDURE °· You will be given medicine through an IV tube to reduce discomfort and make you sleepy (sedative).   °· An electrical shock will be delivered. °AFTER THE PROCEDURE °Your heart rhythm will be watched to make sure it does not change.  °Document Released: 10/04/2002 Document Revised: 02/28/2014 Document Reviewed: 04/28/2013 °[employer]® Patient Information ©2015 [employer]. This information is not intended to replace advice given to you by your health care provider. Make sure you discuss any questions you have with your health care provider. ° °

## 2015-06-29 NOTE — Telephone Encounter (Signed)
Returned pt call. Pt taken to [facility] ED last night by ambulance. Pt was in AFIB and was cardioverted successfully during his ED visit. Pt was d/c this morning. Pt sts that he is doing ok. He feel a little washed out and his chest is sore. He was adv to call the office to let Dr.Smith know about his ED visit and for further instruction. Adv pt I will fwd an update to Dr.Smith and call back with his recommendation. Pt appreciative for the call back and verbalized understanding.

## 2015-06-29 NOTE — ED Notes (Signed)
Ambulated pt around hall. Pt stated feeling "weak". Only pain noted was the chronic neuropathy and pain in his right leg. No cp, sob, any other complaints noted. Mainted NSR. Dr. Venora Maples notified and bedside after ambulation.

## 2015-06-29 NOTE — ED Notes (Signed)
Synchronized at 120.

## 2015-06-30 MED ORDER — AMIODARONE HCL 200 MG PO TABS: ORAL_TABLET | ORAL | Status: DC

## 2015-06-30 NOTE — Telephone Encounter (Signed)
Pt aware of Dr.Smith's recommendation. Start amiodarone 200 mg by mouth twice a day 3 weeks, then decrease to 200 mg daily. See me or Nicki Reaper or Cecille Rubin in 4 weeks with an EKG and TSH/liver panel.  Pt agreeable Rx sent to pt pharmacy. Appt with Dr.Smith scheduled for 10/3 '@10am'$   Pt wanted to know if Dr.Smith knew anything about a new procedure for AFIB, pt unsure of the name, he thinks it involves "clamping". Adv pt I will fwd Dr.Smith the message. It could be discussed at his upcoming appt.  Pt verbalized understanding.

## 2015-06-30 NOTE — Telephone Encounter (Signed)
Start amiodarone 200 mg by mouth twice a day 3 weeks, then decrease to 200 mg daily. See me or Nicki Reaper or Cecille Rubin in 4 weeks with an EKG and TSH/liver panel.

## 2015-07-04 MED ORDER — AMIODARONE HCL 200 MG PO TABS: 200.0000 mg | ORAL_TABLET | Freq: Every day | ORAL | Status: DC

## 2015-07-04 NOTE — Telephone Encounter (Signed)
Pt aware of Dr.Smith's recommendation. HOLD Amiodarone today. RESUME tomorrow just '200mg'$  daily. Adv pt that it would take Amio longer to build up in his system We will see if the lower dose is easier tolerated. Pt appreciative for the call back and verbalized understanding.

## 2015-07-04 NOTE — Telephone Encounter (Signed)
Returned pt call. Pt sts that he started Amiodarone '200mg'$  bid on 9/3 as instructed. Pt sts that he had taken the medication yesterday morning and a bout midday he began to experience chest tightness that lasted a couple of hours. He took his acid reflux med and chewed a aspirin and the discomfort resolved. Pt reports that his breathing was labored during the episode. Pt sts that Amio was giving him a bad taste in his mouth. Pt sts that he did not take Amio last night or this morning and is doing well today. Adv pt that I will update Dr.Smith and call back with his recommendation

## 2015-07-04 NOTE — Telephone Encounter (Signed)
Follow up     Pt had some chest tightness last night.  He thinks it is from the amiodarone.  He also has an after taste in his mouth from the medication.  He did not take rx last night.  No chest tightness this am. Please advise

## 2015-07-31 ENCOUNTER — Encounter: Payer: Self-pay | Admitting: Interventional Cardiology

## 2015-07-31 ENCOUNTER — Ambulatory Visit (INDEPENDENT_AMBULATORY_CARE_PROVIDER_SITE_OTHER): Payer: Medicare Other | Admitting: Interventional Cardiology

## 2015-07-31 VITALS — BP 140/70 | HR 72 | Ht 72.0 in | Wt 219.0 lb

## 2015-07-31 DIAGNOSIS — I25118 Atherosclerotic heart disease of native coronary artery with other forms of angina pectoris: Secondary | ICD-10-CM | POA: Diagnosis not present

## 2015-07-31 DIAGNOSIS — Z79899 Other long term (current) drug therapy: Secondary | ICD-10-CM | POA: Diagnosis not present

## 2015-07-31 DIAGNOSIS — I10 Essential (primary) hypertension: Secondary | ICD-10-CM

## 2015-07-31 DIAGNOSIS — I480 Paroxysmal atrial fibrillation: Secondary | ICD-10-CM

## 2015-07-31 DIAGNOSIS — I5042 Chronic combined systolic (congestive) and diastolic (congestive) heart failure: Secondary | ICD-10-CM

## 2015-07-31 DIAGNOSIS — Z7901 Long term (current) use of anticoagulants: Secondary | ICD-10-CM

## 2015-07-31 LAB — HEPATIC FUNCTION PANEL
ALT: 33 U/L (ref 0–53)
AST: 25 U/L (ref 0–37)
Albumin: 4.1 g/dL (ref 3.5–5.2)
Alkaline Phosphatase: 53 U/L (ref 39–117)
Bilirubin, Direct: 0.2 mg/dL (ref 0.0–0.3)
Total Bilirubin: 0.7 mg/dL (ref 0.2–1.2)
Total Protein: 7.1 g/dL (ref 6.0–8.3)

## 2015-07-31 LAB — TSH: TSH: 1 u[IU]/mL (ref 0.35–4.50)

## 2015-07-31 NOTE — Progress Notes (Signed)
Cardiology Office Note   Date:  07/31/2015   ID:  [name], DOB Mar 21, 1941, MRN [id number]  PCP:  Irven Shelling, MD  Cardiologist:  Sinclair Grooms, MD   Chief Complaint  Patient presents with  . Atrial Fibrillation      History of Present Illness: [name] is a 74 y.o. male who presents for  CAD, chronic combined systolic and diastolic heart failure, hypertension, anticoagulation, and paroxysmal atrial fibrillation.   He had a recent recurrence of atrial fibrillation. I decided to switch to a rhythm control strategy after he required electrical cardioversion in the emergency department. He has had no recurrence since that time. We started amiodarone 200 mg twice a day. He was unable to tolerate the medication because of nausea. We decreased the dose to once daily and he has had no subsequent difficulty. He denies any recurrence of atrial fib.  Past Medical History  Diagnosis Date  . Hypercholesteremia     takes lipitor daily  . AAA (abdominal aortic aneurysm) ([place])   . Pain     back pain chronic- seen at pain clinic  . 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 Bronchitis     hx of  . Ileus ([place])   . Arthritis     BacK  . Incisional hernia     "small, from AAA"  . Coronary atherosclerosis of native coronary artery     ejection fraction of 40%, prior inferior myocardial infarction in February 1993, PTCA, LAD rotational atherectomy in August of 1998, acute inferolateral myocardial infarction, ventricular fibrillation in July of 1999, PTCA and stent of obtuse marginal 1, 100% RCA 1/12 with collaterals from patent circumflex and LAD  . Hypertension     takes Ramipril daily  . MI (myocardial infarction) ([place])     1992 and 1997 sees Dr. Linard Millers as needed   . Dysrhythmia     "got out of rhythym a couple , but it straightened back out."  . Sleep apnea     uses CPAP--sleep study done at least 77yr ago  . Chronic back pain   . Dry skin   . Acid reflux     takes  Prilosec and Protonix daily  . Diabetes mellitus     takes Metformin daily  . Complication of anesthesia     -years ago hair fell out.;ileus after 2 of his surgeries    Past Surgical History  Procedure Laterality Date  . Abdominal surgery    . Abdominal aortic aneurysm repair    . Colonoscopy    . Cholecystectomy    . Back surgery  2013    [place]  . Cardiac catheterization  2009/2012  . Coronary angioplasty      2 stents  . Coronary stent placement    . Cardioversion N/A 04/12/2015    Procedure: CARDIOVERSION;  Surgeon: TSueanne Margarita MD;  [facility];  Service: Cardiovascular;  Laterality: N/A;     Current Outpatient Prescriptions  Medication Sig Dispense Refill  . amiodarone (PACERONE) 200 MG tablet Take 1 tablet (200 mg total) by mouth daily.    .Marland Kitchenatorvastatin (LIPITOR) 10 MG tablet Take 10 mg by mouth daily.    . Coenzyme Q10 (CO Q 10) 100 MG CAPS Take 100 mg by mouth daily.    .Marland Kitchenglimepiride (AMARYL) 2 MG tablet Take 2 mg by mouth daily.    . metFORMIN (GLUCOPHAGE) 1000 MG tablet Take  1,000 mg by mouth 2 (two) times daily with a meal.    . metoprolol (LOPRESSOR) 50 MG tablet TAKE ONE TABLET BY MOUTH TWICE DAILY 60 tablet 10  . Multiple Vitamin (MULTIVITAMIN WITH MINERALS) TABS Take 1 tablet by mouth daily.    [name] omeprazole (PRILOSEC) 20 MG capsule Take 20 mg by mouth daily.    [name] pyridOXINE (VITAMIN B-6) 100 MG tablet Take 100 mg by mouth daily.    . ramipril (ALTACE) 10 MG capsule Take 10 mg by mouth daily.    . rivaroxaban (XARELTO) 20 MG TABS tablet Take 1 tablet (20 mg total) by mouth daily with supper. 30 tablet 3  . vitamin B-12 (CYANOCOBALAMIN) 1000 MCG tablet Take 5,000 mcg by mouth daily.     . [DISCONTINUED] promethazine (PHENERGAN) 25 MG tablet Take 25 mg by mouth every 6 (six) hours as needed.     No current facility-administered medications for this visit.    Allergies:   Cymbalta and Lyrica    Social History:  The  patient  reports that he quit smoking about 23 years ago. He has never used smokeless tobacco. He reports that he drinks alcohol. He reports that he does not use illicit drugs.   Family History:  The patient's family history includes Arthritis in his father; Coronary artery disease in his father and mother; Diabetes in his father and mother; Rheum arthritis in his father.    ROS:  Please see the history of present illness.   Otherwise, review of systems are positive for  Significant lower extremity and back discomfort paresthesias in both legs and feet. Anxiety, muscle pain, irregular heartbeat , and difficulty with balance and walking..   All other systems are reviewed and negative.    PHYSICAL EXAM: VS:  BP 140/70 mmHg  Pulse 72  Ht 6' (1.829 m)  Wt 99.338 kg (219 lb)  BMI 29.70 kg/m2 , BMI Body mass index is 29.7 kg/(m^2). GEN: Well nourished, well developed, in no acute distress HEENT: normal Neck: no JVD, carotid bruits, or masses Cardiac: RRR.  There is no murmur, rub, or gallop. There is no edema. Respiratory:  clear to auscultation bilaterally, normal work of breathing. GI: soft, nontender, nondistended, + BS MS: no deformity or atrophy Skin: warm and dry, no rash Neuro:  Strength and sensation are intact Psych: euthymic mood, full affect   EKG:  EKG  is ordered today. The ekg reveals  Normal sinus rhythm, old inferior infarction, poor R-wave progression, occasional PVC, borderline  QT prolongation.   Recent Labs: 12/12/2014: TSH 0.317* 12/13/2014: Magnesium 1.6 06/29/2015: ALT 33; BUN 13; Creatinine, Ser 0.77; Hemoglobin 13.0; Platelets 132*; Potassium 4.3; Sodium 132*    Lipid Panel    Component Value Date/Time   CHOL  11/26/2010 0750    147        ATP III CLASSIFICATION:  <200     mg/dL   Desirable  200-239  mg/dL   Borderline High  >=240    mg/dL   High          TRIG 132 11/26/2010 0750   HDL 29* 11/26/2010 0750   CHOLHDL 5.1 11/26/2010 0750   VLDL 26  11/26/2010 0750   LDLCALC  11/26/2010 0750    92        Total Cholesterol/HDL:CHD Risk Coronary Heart Disease Risk Table                     Men   Women  1/2  Average Risk   3.4   3.3  Average Risk       5.0   4.4  2 X Average Risk   9.6   7.1  3 X Average Risk  23.4   11.0        Use the calculated Patient Ratio above and the CHD Risk Table to determine the patient's CHD Risk.        ATP III CLASSIFICATION (LDL):  <100     mg/dL   Optimal  100-129  mg/dL   Near or Above                    Optimal  130-159  mg/dL   Borderline  160-189  mg/dL   High  >190     mg/dL   Very High      Wt Readings from Last 3 Encounters:  07/31/15 99.338 kg (219 lb)  05/09/15 100.245 kg (221 lb)  04/11/15 99.247 kg (218 lb 12.8 oz)      Other studies Reviewed: Additional studies/ records that were reviewed today include:  View the emergency room department note.. The findings include  The electrocardiogram that led to cardioversion does document atrial fibrillation. No post-procedure electrocardiogram was performed.    ASSESSMENT AND PLAN:  1. PAF (paroxysmal atrial fibrillation) (HCC)  no recurrence and now on maintenance dose amiodarone therapy  2. Long term (current) use of anticoagulants  taking xarelto without complications  3. Atherosclerosis of native coronary artery with other form of angina pectoris ([place])  asymptomatic  4. Essential hypertension  controlled  5. Chronic combined systolic and diastolic heart failure (HCC)  no evidence of volume overload   6. On amiodarone therapy  discussed the medication and side effect potential. He was unable tolerate 400 mg daily but is having no difficulty with 200 mg once daily.  Current medicines are reviewed at length with the patient today.  The patient has the following concerns regarding medicines:  We discussed amiodarone and side effect potential including liver, lung , and thyroid toxicity.  The following changes/actions have  been instituted:     TSH and hepatic panel today and in 6 months   No other changes in therapy  Labs/ tests ordered today include:  No orders of the defined types were placed in this encounter.     Disposition:   FU with HS in 3 months  Signed, Sinclair Grooms, MD  07/31/2015 10:25 AM    [address] [place], [place], [place]  [address] Phone: [phone]; Fax: [phone]

## 2015-07-31 NOTE — Patient Instructions (Signed)
Medication Instructions:  Your physician recommends that you continue on your current medications as directed. Please refer to the Current Medication list given to you today.   Labwork: Tsh, Hepatic   Testing/Procedures: None ordered  Follow-Up: You have a follow up appointment scheduled on 11/02/15 @ 10:45am  Any Other Special Instructions Will Be Listed Below (If Applicable).

## 2015-08-01 ENCOUNTER — Telehealth: Payer: Self-pay

## 2015-08-01 NOTE — Telephone Encounter (Signed)
-----   Message from Belva Crome, MD sent at 07/31/2015  9:28 PM EDT ----- Labs okay

## 2015-08-01 NOTE — Telephone Encounter (Signed)
Pt aware of labs results. Labs are ok. Pt voiced appreciation and verbalized understanding.

## 2015-08-13 ENCOUNTER — Other Ambulatory Visit: Payer: Self-pay | Admitting: Interventional Cardiology

## 2015-10-09 ENCOUNTER — Telehealth: Payer: Self-pay | Admitting: Interventional Cardiology

## 2015-10-09 NOTE — Telephone Encounter (Signed)
New message      Pt is calling to tell the nurse he was in the hosp over the weekend in [place].  He want to tell you what happened.

## 2015-10-09 NOTE — Telephone Encounter (Signed)
Returned pt call. Pt sts that while visiting [place], [place] last week, he had a bad fall [place]. He was taken to [facility] ED in [place]. Ph # [phone]. Pt st that he bruised his left kidney. His Xarelto was held, and was told to f/u with his pcp and cardiologist once he returned to [place]. Pt sts that he requested his records be fwd to his pcp and our office. [facility], they need a signed ROI form from the pt before they will be able to fwd pt records to our office. Called pt and adv him, to call Brita Romp so they can fwd him the form to be signed and returned to them. Pt verbalized understanding.

## 2015-10-09 NOTE — Telephone Encounter (Signed)
[name] know I spoke with the trauma surgeon. We have decided to keep him off Xarelto for at least 2 weeks before resuming.

## 2015-10-10 NOTE — Telephone Encounter (Signed)
Called to give pt Dr.Smith response below. [email or web address]

## 2015-10-10 NOTE — Telephone Encounter (Signed)
Pt aware of Dr.Smith's recommendation below. Pt sts that the surgeon from [place] has called his pcp to give an update as well

## 2015-10-10 NOTE — Telephone Encounter (Signed)
Follow Up ° °Pt returning call from earlier. Please call. °

## 2015-11-01 DIAGNOSIS — S37012A Minor contusion of left kidney, initial encounter: Secondary | ICD-10-CM | POA: Insufficient documentation

## 2015-11-01 NOTE — Progress Notes (Signed)
Cardiology Office Note   Date:  11/02/2015   ID:  [name], DOB 06-Apr-1941, MRN [id number]  PCP:  Irven Shelling, MD  Cardiologist:  Sinclair Grooms, MD   Chief Complaint  Patient presents with  . Atrial Fibrillation      History of Present Illness: [name] is a 75 y.o. male who presents for coronary artery disease, chronic combined systolic and diastolic heart failure, prior circumflex, LAD  stenting, diabetes mellitus, atrial fibrillation, chronic anticoagulation, and history of severe polyneuropathy involving the lower extremities.  [name] is doing well. He has had no significant recent palpitations. While visiting over Thanksgiving, he had a terrible fall in a motel sustaining a left kidney hematoma. He was off anticoagulation therapy for several weeks. Therapy has been resumed by Dr. [name]. No bleeding issues.    Past Medical History  Diagnosis Date  . Hypercholesteremia     takes lipitor daily  . AAA (abdominal aortic aneurysm) ([place])   . Pain     back pain chronic- seen at pain clinic  . 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 Bronchitis     hx of  . Ileus ([place])   . Arthritis     BacK  . Incisional hernia     "small, from AAA"  . Coronary atherosclerosis of native coronary artery     ejection fraction of 40%, prior inferior myocardial infarction in February 1993, PTCA, LAD rotational atherectomy in August of 1998, acute inferolateral myocardial infarction, ventricular fibrillation in July of 1999, PTCA and stent of obtuse marginal 1, 100% RCA 1/12 with collaterals from patent circumflex and LAD  . Hypertension     takes Ramipril daily  . MI (myocardial infarction) ([place])     1992 and 1997 sees Dr. Linard Millers as needed   . Dysrhythmia     "got out of rhythym a couple , but it straightened back out."  . Sleep apnea     uses CPAP--sleep study done at least 62yr ago  . Chronic back pain   . Dry skin   . Acid reflux     takes Prilosec and Protonix  daily  . Diabetes mellitus     takes Metformin daily  . Complication of anesthesia     -years ago hair fell out.;ileus after 2 of his surgeries    Past Surgical History  Procedure Laterality Date  . Abdominal surgery    . Abdominal aortic aneurysm repair    . Colonoscopy    . Cholecystectomy    . Back surgery  2013    [place]  . Cardiac catheterization  2009/2012  . Coronary angioplasty      2 stents  . Coronary stent placement    . Cardioversion N/A 04/12/2015    Procedure: CARDIOVERSION;  Surgeon: TSueanne Margarita MD;  [facility];  Service: Cardiovascular;  Laterality: N/A;     Current Outpatient Prescriptions  Medication Sig Dispense Refill  . amiodarone (PACERONE) 200 MG tablet Take 1 tablet (200 mg total) by mouth daily.    .Marland Kitchenatorvastatin (LIPITOR) 10 MG tablet Take 10 mg by mouth daily.    . Coenzyme Q10 (CO Q 10) 100 MG CAPS Take 100 mg by mouth daily.    . diazepam (VALIUM) 5 MG tablet Take 5 mg by mouth every 6 (six) hours as needed. (spasms)    . glimepiride (AMARYL) 2 MG tablet Take 2 mg by mouth  daily.    [name] HYDROcodone-acetaminophen (NORCO) 5-325 MG tablet Take 1 - 2 tablets by mouth every 6 hours as needed for pain.    . metFORMIN (GLUCOPHAGE-XR) 500 MG 24 hr tablet Take 500 mg by mouth daily.    . metoprolol (LOPRESSOR) 50 MG tablet TAKE ONE TABLET BY MOUTH TWICE DAILY 60 tablet 10  . Multiple Vitamin (MULTIVITAMIN WITH MINERALS) TABS Take 1 tablet by mouth daily.    [name] omeprazole (PRILOSEC) 20 MG capsule Take 20 mg by mouth daily.    . ondansetron (ZOFRAN) 4 MG tablet Take 4 mg by mouth 3 (three) times daily as needed. (nausea)    . pyridOXINE (VITAMIN B-6) 100 MG tablet Take 100 mg by mouth daily.    . ramipril (ALTACE) 10 MG capsule Take 10 mg by mouth daily.    . rivaroxaban (XARELTO) 20 MG TABS tablet Take 1 tablet (20 mg total) by mouth daily with supper. 30 tablet 5  . vitamin B-12 (CYANOCOBALAMIN) 1000 MCG tablet Take  5,000 mcg by mouth daily.     . [DISCONTINUED] promethazine (PHENERGAN) 25 MG tablet Take 25 mg by mouth every 6 (six) hours as needed.     No current facility-administered medications for this visit.    Allergies:   Cymbalta and Lyrica    Social History:  The patient  reports that he quit smoking about 23 years ago. He has never used smokeless tobacco. He reports that he drinks alcohol. He reports that he does not use illicit drugs.   Family History:  The patient's family history includes Arthritis in his father; Coronary artery disease in his father and mother; Diabetes in his father and mother; Rheum arthritis in his father.    ROS:  Please see the history of present illness.   Otherwise, review of systems are positive for difficulty ambulating and maintaining balance because of neuropathy and pain in lower extremities..   All other systems are reviewed and negative.    PHYSICAL EXAM: VS:  BP 104/60 mmHg  Pulse 66  Ht 6' 1.5" (1.867 m)  Wt 209 lb (94.802 kg)  BMI 27.20 kg/m2 , BMI Body mass index is 27.2 kg/(m^2). GEN: Well nourished, well developed, in no acute distress HEENT: normal Neck: no JVD, carotid bruits, or masses Cardiac: RRR.  There is no murmur, rub, or gallop. There is no edema. Respiratory:  clear to auscultation bilaterally, normal work of breathing. GI: soft, nontender, nondistended, + BS MS: no deformity or atrophy Skin: warm and dry, no rash Neuro:  Strength and sensation are intact Psych: euthymic mood, full affect   EKG:  EKG is ordered today. The ekg reveals normal sinus rhythm, inferior infarct, all. Lateral infarct, old.    Recnt Labs: 12/13/2014: Magnesium 1.6 06/29/2015: BUN 13; Creatinine, Ser 0.77; Hemoglobin 13.0; Platelets 132*; Potassium 4.3; Sodium 132* 07/31/2015: ALT 33; TSH 1.00    Lipid Panel    Component Value Date/Time   CHOL  11/26/2010 0750    147        ATP III CLASSIFICATION:  <200     mg/dL   Desirable  200-239  mg/dL    Borderline High  >=240    mg/dL   High          TRIG 132 11/26/2010 0750   HDL 29* 11/26/2010 0750   CHOLHDL 5.1 11/26/2010 0750   VLDL 26 11/26/2010 0750   LDLCALC  11/26/2010 0750    92        Total  Cholesterol/HDL:CHD Risk Coronary Heart Disease Risk Table                     Men   Women  1/2 Average Risk   3.4   3.3  Average Risk       5.0   4.4  2 X Average Risk   9.6   7.1  3 X Average Risk  23.4   11.0        Use the calculated Patient Ratio above and the CHD Risk Table to determine the patient's CHD Risk.        ATP III CLASSIFICATION (LDL):  <100     mg/dL   Optimal  100-129  mg/dL   Near or Above                    Optimal  130-159  mg/dL   Borderline  160-189  mg/dL   High  >190     mg/dL   Very High      Wt Readings from Last 3 Encounters:  11/02/15 209 lb (94.802 kg)  07/31/15 219 lb (99.338 kg)  05/09/15 221 lb (100.245 kg)      Other studies Reviewed: Additional studies/ records that were reviewed today include: none. The findings include none.    ASSESSMENT AND PLAN:  1. PAF (paroxysmal atrial fibrillation) (HCC) No recent sustained episodes  2. Chronic combined systolic and diastolic heart failure (HCC) No evidence of volume overload   3. Long term (current) use of anticoagulants No bleeding   4. On amiodarone therapy No evidence of toxicity recent blood work by Dr. Laurann Montana  5. Essential hypertension Blood pressure is well controlled   6. Hematoma of left kidney, sequela Resolving    Current medicines are reviewed at length with the patient today.  The patient has the following concerns regarding medicines: .  The following changes/actions have been instituted:    Report any head trauma    agree with resumption of anticoagulation therapy  Continue on amiodarone  Liver panel and TSH in 6 months  Labs/ tests ordered today include:  No orders of the defined types were placed in this encounter.     Disposition:   FU with  HS in 6 months  Signed, Sinclair Grooms, MD  11/02/2015 12:48 PM    [address], [place], [place]  [address] Phone: [phone]; Fax: [phone]

## 2015-11-02 ENCOUNTER — Encounter: Payer: Self-pay | Admitting: Interventional Cardiology

## 2015-11-02 ENCOUNTER — Ambulatory Visit (INDEPENDENT_AMBULATORY_CARE_PROVIDER_SITE_OTHER): Payer: Medicare Other | Admitting: Interventional Cardiology

## 2015-11-02 VITALS — BP 104/60 | HR 66 | Ht 73.5 in | Wt 209.0 lb

## 2015-11-02 DIAGNOSIS — I5042 Chronic combined systolic (congestive) and diastolic (congestive) heart failure: Secondary | ICD-10-CM | POA: Diagnosis not present

## 2015-11-02 DIAGNOSIS — Z79899 Other long term (current) drug therapy: Secondary | ICD-10-CM | POA: Diagnosis not present

## 2015-11-02 DIAGNOSIS — I10 Essential (primary) hypertension: Secondary | ICD-10-CM

## 2015-11-02 DIAGNOSIS — S37012S Minor contusion of left kidney, sequela: Secondary | ICD-10-CM

## 2015-11-02 DIAGNOSIS — I480 Paroxysmal atrial fibrillation: Secondary | ICD-10-CM | POA: Diagnosis not present

## 2015-11-02 DIAGNOSIS — Z7901 Long term (current) use of anticoagulants: Secondary | ICD-10-CM

## 2015-11-02 NOTE — Patient Instructions (Addendum)
Medication Instructions:  Your physician recommends that you continue on your current medications as directed. Please refer to the Current Medication list given to you today.   Labwork: None ordered  Testing/Procedures: None ordered  Follow-Up: Your physician wants you to follow-up in: 6 months with Dr. Smith. You will receive a reminder letter in the mail two months in advance. If you don't receive a letter, please call our office to schedule the follow-up appointment.   Any Other Special Instructions Will Be Listed Below (If Applicable).     If you need a refill on your cardiac medications before your next appointment, please call your pharmacy.   

## 2015-11-03 NOTE — Addendum Note (Signed)
Addended by: Freada Bergeron on: 11/03/2015 04:51 PM   Modules accepted: Orders

## 2015-12-12 ENCOUNTER — Other Ambulatory Visit: Payer: Self-pay | Admitting: Interventional Cardiology

## 2015-12-12 ENCOUNTER — Ambulatory Visit (INDEPENDENT_AMBULATORY_CARE_PROVIDER_SITE_OTHER): Payer: Medicare Other | Admitting: *Deleted

## 2015-12-12 DIAGNOSIS — Z7901 Long term (current) use of anticoagulants: Secondary | ICD-10-CM | POA: Diagnosis not present

## 2015-12-12 LAB — CBC WITH DIFFERENTIAL/PLATELET
Basophils Absolute: 0 10*3/uL (ref 0.0–0.1)
Basophils Relative: 0 % (ref 0–1)
Eosinophils Absolute: 0 10*3/uL (ref 0.0–0.7)
Eosinophils Relative: 0 % (ref 0–5)
HCT: 43.6 % (ref 39.0–52.0)
Hemoglobin: 15.2 g/dL (ref 13.0–17.0)
Lymphocytes Relative: 21 % (ref 12–46)
Lymphs Abs: 1 10*3/uL (ref 0.7–4.0)
MCH: 31.5 pg (ref 26.0–34.0)
MCHC: 34.9 g/dL (ref 30.0–36.0)
MCV: 90.3 fL (ref 78.0–100.0)
MPV: 10.2 fL (ref 8.6–12.4)
Monocytes Absolute: 0.4 10*3/uL (ref 0.1–1.0)
Monocytes Relative: 9 % (ref 3–12)
Neutro Abs: 3.4 10*3/uL (ref 1.7–7.7)
Neutrophils Relative %: 70 % (ref 43–77)
Platelets: 137 10*3/uL — ABNORMAL LOW (ref 150–400)
RBC: 4.83 MIL/uL (ref 4.22–5.81)
RDW: 13.7 % (ref 11.5–15.5)
WBC: 4.8 10*3/uL (ref 4.0–10.5)

## 2015-12-12 LAB — BASIC METABOLIC PANEL
BUN: 24 mg/dL (ref 7–25)
CO2: 26 mmol/L (ref 20–31)
Calcium: 9.7 mg/dL (ref 8.6–10.3)
Chloride: 100 mmol/L (ref 98–110)
Creat: 1.17 mg/dL (ref 0.70–1.18)
Glucose, Bld: 259 mg/dL — ABNORMAL HIGH (ref 65–99)
Potassium: 4.5 mmol/L (ref 3.5–5.3)
Sodium: 137 mmol/L (ref 135–146)

## 2015-12-12 NOTE — Progress Notes (Signed)
Pt on Xarelto, this is 6 month f/u, Dr Tamala Julian his cardiologist  Reviewed patients medication list.  Pt is not currently on any combined P-gp and strong CYP3A4 inhibitors/inducers (ketoconazole, traconazole, ritonavir, carbamazepine, phenytoin, rifampin, St. John's wort).  Reviewed labs.  SCr 1.17 , Weight 94.80 kg, CrCl 72.02  .  Dose appropriate  based on CrCl. 72.02   Cbc HGB 15.2 bmet Sr Cr 1.17  A full discussion of the nature of anticoagulants has been carried out.  A benefit/risk analysis has been presented to the patient, so that they understand the justification for choosing anticoagulation with Xarelto at this time.  The need for compliance is stressed.  Pt is aware to take the medication once daily with the largest meal of the day.  Side effects of potential bleeding are discussed, including unusual colored urine or stools, coughing up blood or coffee ground emesis, nose bleeds or serious fall or head trauma.  Discussed signs and symptoms of stroke. The patient should avoid any OTC items containing aspirin or ibuprofen.  Avoid alcohol consumption.   Call if any signs of abnormal bleeding.  Discussed financial obligations and resolved any difficulty in obtaining medication.  Next lab test test in 1 month for BMET   Was in hospital with fall in December, didn't hit head and no scan done, no unusual bleeding, bruised ribs. 12/13/2015 left message on phone to return call to clinic 12/14/15-Spoke with pt and advised of lab results. Advised that his creatinine was increased since last result therefore will obtain a BMET on 01/09/16 & he verbalized understanding.

## 2015-12-12 NOTE — Patient Instructions (Signed)
Pt was started on Xarelto for  A full discussion of the nature of anticoagulants has been carried out.  A benefit/risk analysis has been presented to the patient, so that they understand the justification for choosing anticoagulation with Xarelto at this time.  The need for compliance is stressed.  Pt is aware to take the medication once daily with the largest meal of the day.  Side effects of potential bleeding are discussed, including unusual colored urine or stools, coughing up blood or coffee ground emesis, nose bleeds or serious fall or head trauma.  Discussed signs and symptoms of stroke. The patient should avoid any OTC items containing aspirin or ibuprofen.  Avoid alcohol consumption.   Call if any signs of abnormal bleeding.  Discussed financial obligations and resolved any difficulty in obtaining medication.  Next lab test test in 6 months.

## 2015-12-13 DIAGNOSIS — M21379 Foot drop, unspecified foot: Secondary | ICD-10-CM | POA: Insufficient documentation

## 2015-12-18 ENCOUNTER — Other Ambulatory Visit (HOSPITAL_COMMUNITY): Payer: Self-pay | Admitting: Neurological Surgery

## 2015-12-18 ENCOUNTER — Other Ambulatory Visit: Payer: Self-pay | Admitting: Neurological Surgery

## 2015-12-18 DIAGNOSIS — M21371 Foot drop, right foot: Secondary | ICD-10-CM

## 2015-12-18 DIAGNOSIS — M21372 Foot drop, left foot: Principal | ICD-10-CM

## 2016-01-04 ENCOUNTER — Ambulatory Visit (HOSPITAL_COMMUNITY)
Admission: RE | Admit: 2016-01-04 | Discharge: 2016-01-04 | Disposition: A | Discharge status: Home / Self Care | Payer: Medicare Other | Source: Ambulatory Visit | Attending: Neurological Surgery | Admitting: Neurological Surgery

## 2016-01-04 DIAGNOSIS — M21372 Foot drop, left foot: Secondary | ICD-10-CM | POA: Insufficient documentation

## 2016-01-04 DIAGNOSIS — M4716 Other spondylosis with myelopathy, lumbar region: Secondary | ICD-10-CM | POA: Diagnosis not present

## 2016-01-04 DIAGNOSIS — M21379 Foot drop, unspecified foot: Secondary | ICD-10-CM | POA: Diagnosis present

## 2016-01-04 DIAGNOSIS — M21371 Foot drop, right foot: Secondary | ICD-10-CM

## 2016-01-04 DIAGNOSIS — Z981 Arthrodesis status: Secondary | ICD-10-CM | POA: Diagnosis not present

## 2016-01-04 DIAGNOSIS — I714 Abdominal aortic aneurysm, without rupture: Secondary | ICD-10-CM | POA: Diagnosis not present

## 2016-01-04 DIAGNOSIS — M4806 Spinal stenosis, lumbar region: Secondary | ICD-10-CM | POA: Diagnosis not present

## 2016-01-04 DIAGNOSIS — M2578 Osteophyte, vertebrae: Secondary | ICD-10-CM | POA: Insufficient documentation

## 2016-01-04 DIAGNOSIS — I700 Atherosclerosis of aorta: Secondary | ICD-10-CM | POA: Insufficient documentation

## 2016-01-04 DIAGNOSIS — M4802 Spinal stenosis, cervical region: Secondary | ICD-10-CM | POA: Insufficient documentation

## 2016-01-04 DIAGNOSIS — M50221 Other cervical disc displacement at C4-C5 level: Secondary | ICD-10-CM | POA: Insufficient documentation

## 2016-01-04 LAB — GLUCOSE, CAPILLARY
Glucose-Capillary: 175 mg/dL — ABNORMAL HIGH (ref 65–99)
Glucose-Capillary: 160 mg/dL — ABNORMAL HIGH (ref 65–99)

## 2016-01-04 MED ORDER — DIAZEPAM 5 MG PO TABS
10.0000 mg | ORAL_TABLET | Freq: Once | ORAL | Status: AC
  Administered 2016-01-04: 10 mg via ORAL

## 2016-01-04 MED ORDER — LIDOCAINE HCL (PF) 1 % IJ SOLN
INTRAMUSCULAR | Status: AC
  Administered 2016-01-04: 5 mL via INTRAMUSCULAR
  Filled 2016-01-04: qty 5

## 2016-01-04 MED ORDER — DIAZEPAM 5 MG PO TABS
ORAL_TABLET | ORAL | Status: AC
Start: 2016-01-04 — End: 2016-01-04
  Administered 2016-01-04: 10 mg via ORAL
  Filled 2016-01-04: qty 2

## 2016-01-04 MED ORDER — IOHEXOL 300 MG/ML  SOLN
10.0000 mL | Freq: Once | INTRAMUSCULAR | Status: AC | PRN
  Administered 2016-01-04: 9 mL via INTRATHECAL

## 2016-01-04 MED ORDER — ONDANSETRON HCL 4 MG/2ML IJ SOLN: 4.0000 mg | Freq: Four times a day (QID) | INTRAMUSCULAR | Status: DC | PRN

## 2016-01-04 MED ORDER — HYDROCODONE-ACETAMINOPHEN 5-325 MG PO TABS
1.0000 | ORAL_TABLET | ORAL | Status: DC | PRN
  Administered 2016-01-04: 1 via ORAL

## 2016-01-04 MED ORDER — HYDROCODONE-ACETAMINOPHEN 5-325 MG PO TABS
ORAL_TABLET | ORAL | Status: AC
  Filled 2016-01-04: qty 1

## 2016-01-04 NOTE — Procedures (Signed)
Mr. 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 Patient has had progressive difficulty with his gait and weakness in his legs and generalized weakness in the upper extremities also. He has findings of spondylitic changes but he is now coming myelopathic with his gait has evidence of bilateral foot drops. After careful consideration of his options I advised a total myelogram to look at the spinal axis to rule out other potential causes of myelopathy both in the cervical and lumbar spines but also in the thoracic spine. He is now undergoing total myelogram.  Pre op Dx: Spondylosis with myelopathy Post op Dx: Spondylosis with myelopathy Procedure: Total myelogram Surgeon: Tyronn Golda Puncture level: L2-3 Fluid color: Clear, colorless Injection: Iohexol 300 concentration. 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 Cervical spondylosis with areas of narrowing. Further evaluation with CT scanning.

## 2016-01-04 NOTE — Discharge Instructions (Signed)
Myelography, Care After °These instructions give you information on caring for yourself after your procedure. Your doctor may also give you more specific instructions. Call your doctor if you have any problems or questions after your procedure. °HOME CARE °· Rest the first day. °· When you rest, lie flat, with your head slightly raised (elevated). °· Avoid heavy lifting and activity for 48 hours, or as told by your doctor. °· You may take the bandage (dressing) off one day after the test, or as told by your doctor. °· Take all medicines only as told by your doctor. °· Ask your doctor when it is okay to take a shower or bath. °· Ask your doctor when your test results will be ready and how you can get them. Make sure you follow up and get your results. °· Do not drink alcohol for 24 hours, or as told by your doctor. °· Drink enough fluid to keep your pee (urine) clear or pale yellow. °GET HELP IF:  °· You have a fever. °· You have a headache. °· You feel sick to your stomach (nauseous) or throw up (vomit). °· You have pain or cramping in your belly (abdomen). °GET HELP RIGHT AWAY IF:  °· You have a headache with a stiff neck or fever. °· You have trouble breathing. °· Any of the places where the needles were put in are: °¨ Puffy (swollen) or red. °¨ Sore or hot to the touch. °¨ Draining yellowish-white fluid (pus). °¨ Bleeding. °MAKE SURE YOU: °· Understand these instructions. °· Will watch your condition. °· Will get help right away if you are not doing well or get worse. °  °This information is not intended to replace advice given to you by your health care provider. Make sure you discuss any questions you have with your health care provider. °  °Document Released: 07/23/2008 Document Revised: 11/04/2014 Document Reviewed: 07/08/2012 °Elsevier Interactive Patient Education ©2016 [employer] Inc. ° °

## 2016-01-09 ENCOUNTER — Other Ambulatory Visit (INDEPENDENT_AMBULATORY_CARE_PROVIDER_SITE_OTHER): Payer: Medicare Other | Admitting: *Deleted

## 2016-01-09 DIAGNOSIS — Z7901 Long term (current) use of anticoagulants: Secondary | ICD-10-CM

## 2016-01-09 LAB — BASIC METABOLIC PANEL
BUN: 24 mg/dL (ref 7–25)
CO2: 27 mmol/L (ref 20–31)
Calcium: 10.2 mg/dL (ref 8.6–10.3)
Chloride: 103 mmol/L (ref 98–110)
Creat: 1.03 mg/dL (ref 0.70–1.18)
Glucose, Bld: 272 mg/dL — ABNORMAL HIGH (ref 65–99)
Potassium: 4.5 mmol/L (ref 3.5–5.3)
Sodium: 139 mmol/L (ref 135–146)

## 2016-01-10 ENCOUNTER — Other Ambulatory Visit: Payer: Self-pay | Admitting: Neurological Surgery

## 2016-01-10 ENCOUNTER — Telehealth: Payer: Self-pay | Admitting: Interventional Cardiology

## 2016-01-10 NOTE — Telephone Encounter (Signed)
Returned call to [place] @ Dr.Elsner's office. Pt is scheduled to have a right L5-S1 laminectomy on Tues 3/21. Dr.Elner is requesting Dr.Smith's recommendation on how long to hole Xarelto. Jorja Loa that Dr.Smith is not on the office today. I will fwd him a message.  Janett Billow rqst Dr.Smith's response be faxed to [phone]

## 2016-01-10 NOTE — Telephone Encounter (Signed)
New message:   Please call Jessica,regarding Charlann Noss.

## 2016-01-12 ENCOUNTER — Encounter (HOSPITAL_COMMUNITY): Payer: Self-pay

## 2016-01-12 ENCOUNTER — Encounter (HOSPITAL_COMMUNITY)
Admission: RE | Admit: 2016-01-12 | Discharge: 2016-01-12 | Disposition: A | Discharge status: Home / Self Care | Payer: Medicare Other | Source: Ambulatory Visit | Attending: Neurological Surgery | Admitting: Neurological Surgery

## 2016-01-12 DIAGNOSIS — E785 Hyperlipidemia, unspecified: Secondary | ICD-10-CM | POA: Insufficient documentation

## 2016-01-12 DIAGNOSIS — I2510 Atherosclerotic heart disease of native coronary artery without angina pectoris: Secondary | ICD-10-CM | POA: Insufficient documentation

## 2016-01-12 DIAGNOSIS — Z87891 Personal history of nicotine dependence: Secondary | ICD-10-CM | POA: Insufficient documentation

## 2016-01-12 DIAGNOSIS — M21372 Foot drop, left foot: Secondary | ICD-10-CM | POA: Insufficient documentation

## 2016-01-12 DIAGNOSIS — I252 Old myocardial infarction: Secondary | ICD-10-CM | POA: Insufficient documentation

## 2016-01-12 DIAGNOSIS — Z7901 Long term (current) use of anticoagulants: Secondary | ICD-10-CM | POA: Insufficient documentation

## 2016-01-12 DIAGNOSIS — M21371 Foot drop, right foot: Secondary | ICD-10-CM | POA: Insufficient documentation

## 2016-01-12 DIAGNOSIS — K219 Gastro-esophageal reflux disease without esophagitis: Secondary | ICD-10-CM | POA: Diagnosis not present

## 2016-01-12 DIAGNOSIS — G4733 Obstructive sleep apnea (adult) (pediatric): Secondary | ICD-10-CM | POA: Insufficient documentation

## 2016-01-12 DIAGNOSIS — I110 Hypertensive heart disease with heart failure: Secondary | ICD-10-CM | POA: Insufficient documentation

## 2016-01-12 DIAGNOSIS — Z955 Presence of coronary angioplasty implant and graft: Secondary | ICD-10-CM | POA: Insufficient documentation

## 2016-01-12 DIAGNOSIS — I4891 Unspecified atrial fibrillation: Secondary | ICD-10-CM | POA: Insufficient documentation

## 2016-01-12 DIAGNOSIS — Z01818 Encounter for other preprocedural examination: Secondary | ICD-10-CM | POA: Diagnosis present

## 2016-01-12 DIAGNOSIS — Z01812 Encounter for preprocedural laboratory examination: Secondary | ICD-10-CM | POA: Diagnosis not present

## 2016-01-12 DIAGNOSIS — I5040 Unspecified combined systolic (congestive) and diastolic (congestive) heart failure: Secondary | ICD-10-CM | POA: Diagnosis not present

## 2016-01-12 DIAGNOSIS — Z79899 Other long term (current) drug therapy: Secondary | ICD-10-CM | POA: Diagnosis not present

## 2016-01-12 DIAGNOSIS — Z7984 Long term (current) use of oral hypoglycemic drugs: Secondary | ICD-10-CM | POA: Diagnosis not present

## 2016-01-12 DIAGNOSIS — E119 Type 2 diabetes mellitus without complications: Secondary | ICD-10-CM | POA: Diagnosis not present

## 2016-01-12 HISTORY — DX: Foot drop, left foot: M21.371

## 2016-01-12 HISTORY — DX: Plantar fascial fibromatosis: M72.2

## 2016-01-12 HISTORY — DX: Foot drop, left foot: M21.372

## 2016-01-12 LAB — PROTIME-INR
INR: 1.11 (ref 0.00–1.49)
Prothrombin Time: 14.5 seconds (ref 11.6–15.2)

## 2016-01-12 LAB — BASIC METABOLIC PANEL
Anion gap: 10 (ref 5–15)
BUN: 22 mg/dL — ABNORMAL HIGH (ref 6–20)
CO2: 27 mmol/L (ref 22–32)
Calcium: 10.2 mg/dL (ref 8.9–10.3)
Chloride: 102 mmol/L (ref 101–111)
Creatinine, Ser: 1.13 mg/dL (ref 0.61–1.24)
GFR calc Af Amer: >60 mL/min (ref >60)
GFR calc non Af Amer: >60 mL/min (ref >60)
Glucose, Bld: 176 mg/dL — ABNORMAL HIGH (ref 65–99)
Potassium: 4.7 mmol/L (ref 3.5–5.1)
Sodium: 139 mmol/L (ref 135–145)

## 2016-01-12 LAB — CBC
HCT: 42 % (ref 39.0–52.0)
Hemoglobin: 14.4 g/dL (ref 13.0–17.0)
MCH: 30.9 pg (ref 26.0–34.0)
MCHC: 34.3 g/dL (ref 30.0–36.0)
MCV: 90.1 fL (ref 78.0–100.0)
Platelets: 128 10*3/uL — ABNORMAL LOW (ref 150–400)
RBC: 4.66 MIL/uL (ref 4.22–5.81)
RDW: 14.1 % (ref 11.5–15.5)
WBC: 5.4 10*3/uL (ref 4.0–10.5)

## 2016-01-12 LAB — SURGICAL PCR SCREEN
MRSA, PCR: NEGATIVE
Staphylococcus aureus: NEGATIVE

## 2016-01-12 LAB — GLUCOSE, CAPILLARY: Glucose-Capillary: 187 mg/dL — ABNORMAL HIGH (ref 65–99)

## 2016-01-12 NOTE — Pre-Procedure Instructions (Signed)
[name]  01/12/2016      Your procedure is scheduled on Tuesday, March 21.  Report to [facility] Admitting at 11:15 A.M.                Your surgery or procedure is scheduled for 2:15 PM   Call this number if you have problems the morning of [phone]   Remember:  Do not eat food or drink liquids after midnight Monday, March 20.  Take these medicines the morning of surgery with A SIP OF WATER :amiodarone (PACERONE), atorvastatin (LIPITOR), metoprolol (LOPRESSOR), omeprazole (PRILOSEC).              May take if need:HYDROcodone-acetaminophen ([place]),  ondansetron [facility]).               Hold Xarelto as instructed by Dr Ellene Route.                 STOP all vitamins and Herbal medications.  How to Manage Your Diabetes Before Surgery  What do I do about my diabetes medications?    Do not take oral diabetes medicines (pills) the morning of surgery.  Why is it important to control my blood sugar before and after surgery?   Improving blood sugar levels before and after surgery helps healing and can limit problems.  A way of improving blood sugar control is eating a healthy diet by:  - Eating less sugar and carbohydrates  - Increasing activity/exercise  - Talk with your doctor about reaching your blood sugar goals  High blood sugars (greater than 180 mg/dL) can raise your risk of infections and slow down your recovery so you will need to focus on controlling your diabetes during the weeks before surgery.  Make sure that the doctor who takes care of your diabetes knows about your planned surgery including the date and location.  How do I manage my blood sugars before surgery?   Check your blood sugar at least 4 times a day, 2 days before surgery to make sure that they are not too high or low.   Check your blood sugar the morning of your surgery when you wake up and every 2 hours until you get to the Short-Stay unit.  If your blood sugar is less than  70 mg/dL, you will need to treat for low blood sugar by:  Treat a low blood sugar (less than 70 mg/dL) with 1/2 cup of clear juice (cranberry or apple), 4 glucose tablets, OR glucose gel.  Recheck blood sugar in 15 minutes after treatment (to make sure it is greater than 70 mg/dL).  If blood sugar is not greater than 70 mg/dL on re-check, call [phone] for further instructions.   Report your blood sugar to the Short-Stay nurse when you get to Short-Stay.    Do not wear jewelry, make-up or nail polish.  Do not wear lotions, powders, or perfumes.     Men may shave face and neck.  Do not bring valuables to the hospital.  [facility] is not responsible for any belongings or valuables.  Contacts, dentures or bridgework may not be worn into surgery.  Leave your suitcase in the car.  After surgery it may be brought to your room.  For patients admitted to the hospital, discharge time will be determined by your treatment team.  Special instructions:  Review  [place] - Preparing For Surgery.  Please read over the following fact sheets that you were given. Pain Booklet,  Coughing and Deep Breathing, MRSA Information and Surgical Site Infection Prevention

## 2016-01-12 NOTE — Telephone Encounter (Signed)
Pt has a CHADS score of 4.  No history of TIA or stroke.  Ok to hold Xarelto x 3 doses prior to procedure.   Faxed to Dr. Clarice Pole office and informed Jan at pre-admission.  She will inform patient.

## 2016-01-12 NOTE — Progress Notes (Signed)
I spoke with Mr Tavano and informed him of information from [place] at Dr [employer] office,; hold Xarelto x 3 doses.  Mr Baratta reported that he also heared from Dr Clarice Pole office the same information.  Mr Wilden said that he will take his last dose of Xarelto tonight prior to surgery.

## 2016-01-12 NOTE — Progress Notes (Signed)
Mr [name] has not been instructed regarding Xarelto.  [name] at Dr Wallene Huh office is not in the office today, nor is [name], I spoke with [name] at [facility].  [name] took information and notified Dr Tamala Julian nurse.

## 2016-01-12 NOTE — Telephone Encounter (Signed)
Follow Up:     Waiting for clearance,pt scheduled for surgery on 01-16-16. If not at the first number,please [phone].

## 2016-01-13 NOTE — Telephone Encounter (Signed)
Okay to hold Xarelto for 48 -72 hours prior to laminectomy.

## 2016-01-15 ENCOUNTER — Encounter (HOSPITAL_COMMUNITY): Payer: Self-pay

## 2016-01-15 IMAGING — CT CT ABD-PELV W/ CM
3 of 5 series · 12 of 36 positions shown, 18 images · IV contrast (READICAT/WATER & [ID] OMNI 300)
Comparison: CT abdomen and pelvis of 09/09/2011

CLINICAL DATA: Abdominal pain, nausea, bloating, worsening recently

EXAM:
CT ABDOMEN AND PELVIS WITH CONTRAST
TECHNIQUE: Multidetector CT imaging of the abdomen and pelvis was performed
using the standard protocol following bolus administration of
intravenous contrast.
CONTRAST:  125mL OMNIPAQUE IOHEXOL 300 MG/ML  SOLN

[Series 3: abd/pelvis with · axial · 0.87mm/px · z∈[-364,-4]mm · 7 of 97 slices shown, 12 images]
[im 13/97  soft-tissue]
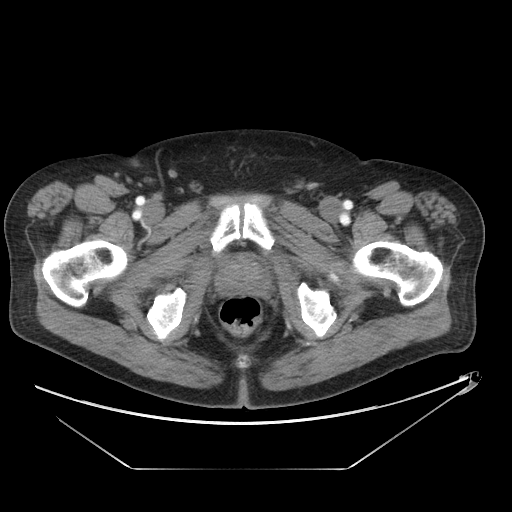
[im 13/97  bone]
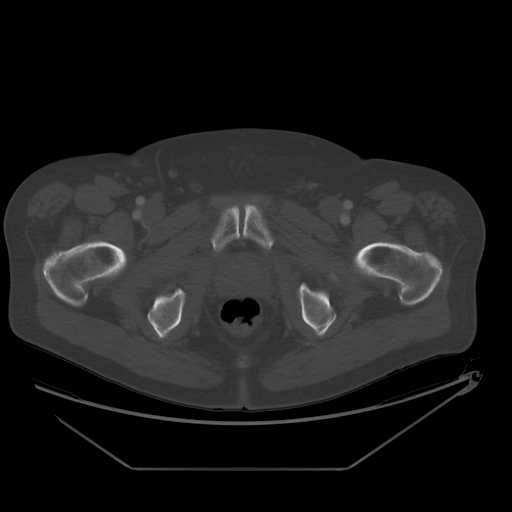
[im 25/97  soft-tissue]
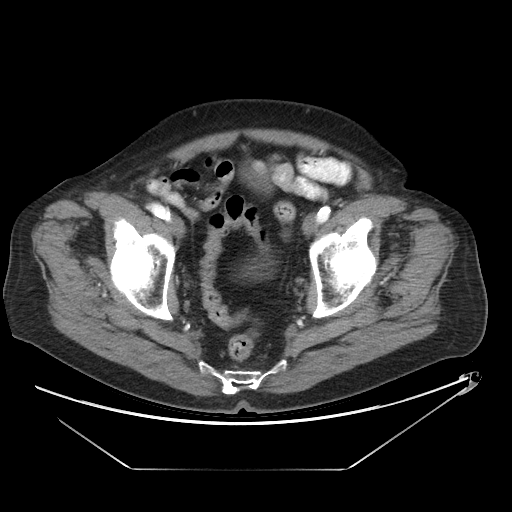
[im 37/97  soft-tissue]
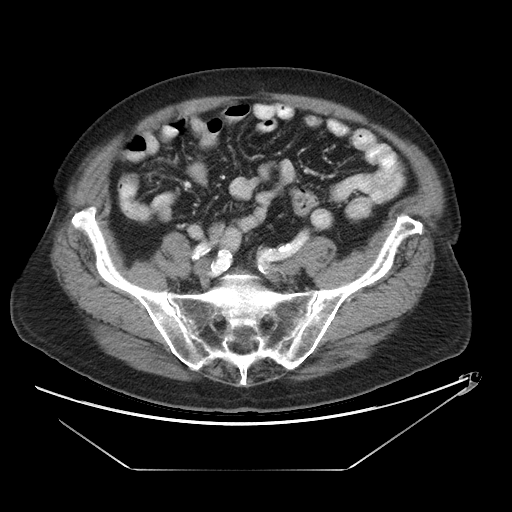
[im 49/97  soft-tissue]
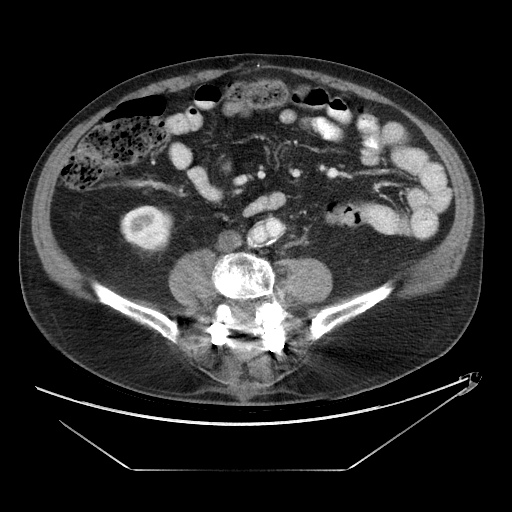
[im 49/97  lung]
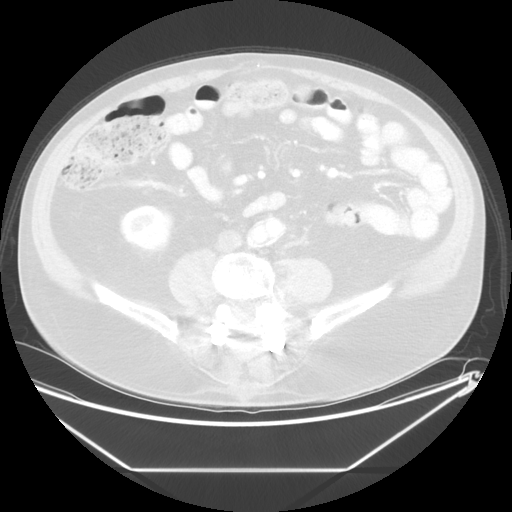
[im 61/97  soft-tissue]
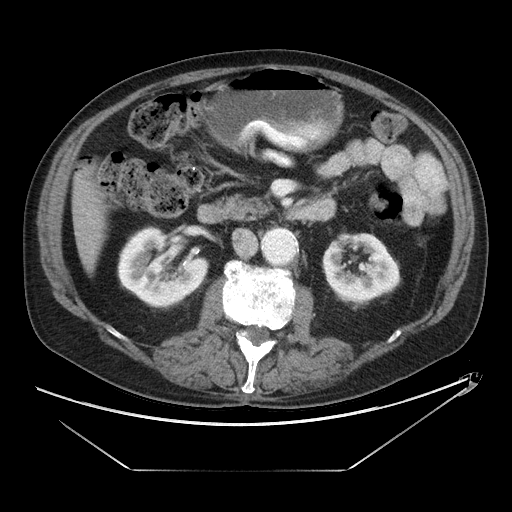
[im 61/97  lung]
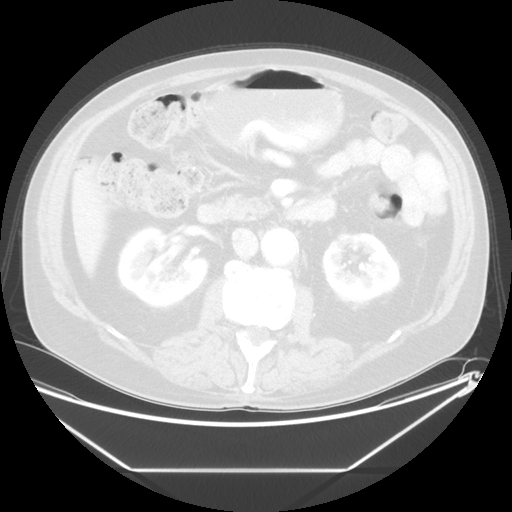
[im 73/97  soft-tissue]
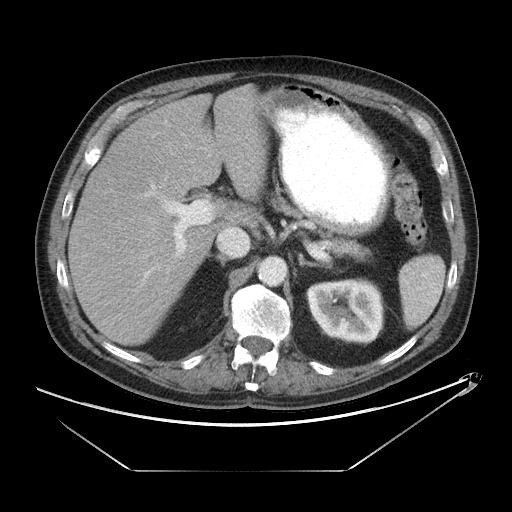
[im 73/97  lung]
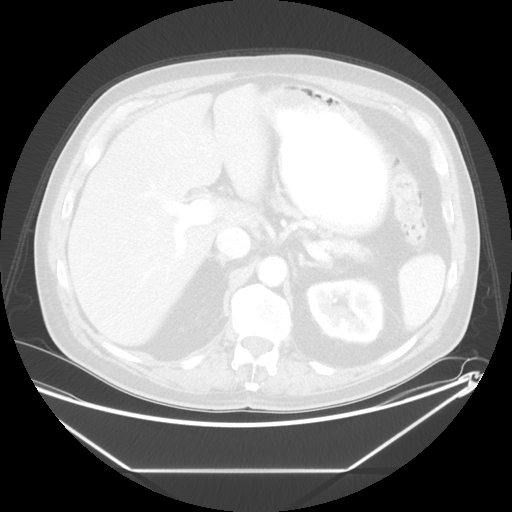
[im 85/97  soft-tissue]
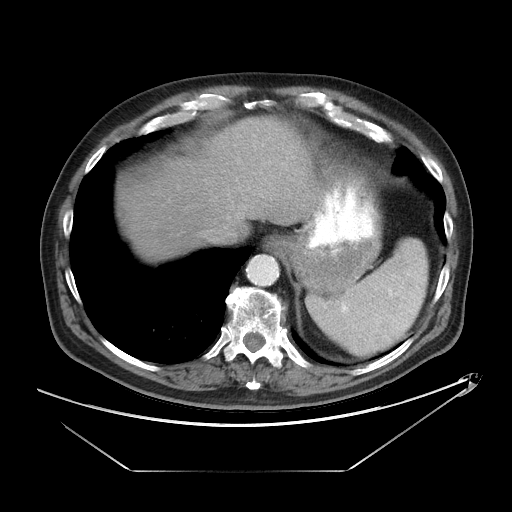
[im 85/97  lung]
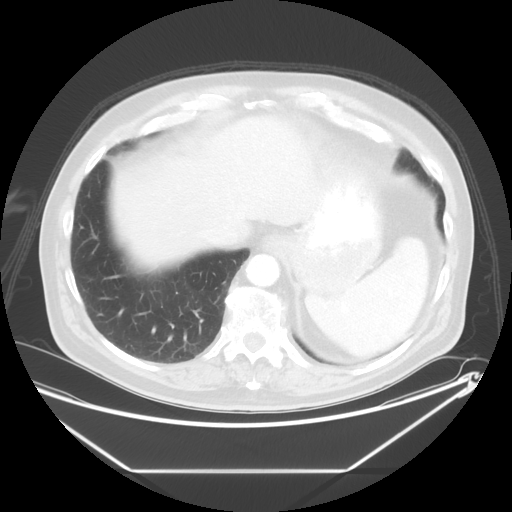

[Series 601: coronal body · coronal · 0.95mm/px · 1 of 131 slices shown, 2 images]
[im 44/131  soft-tissue]
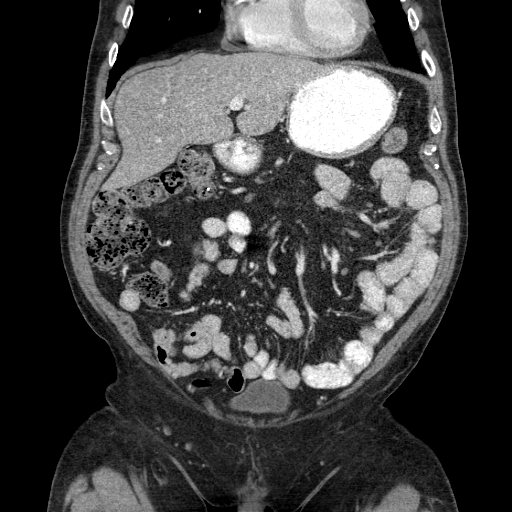
[im 44/131  bone]
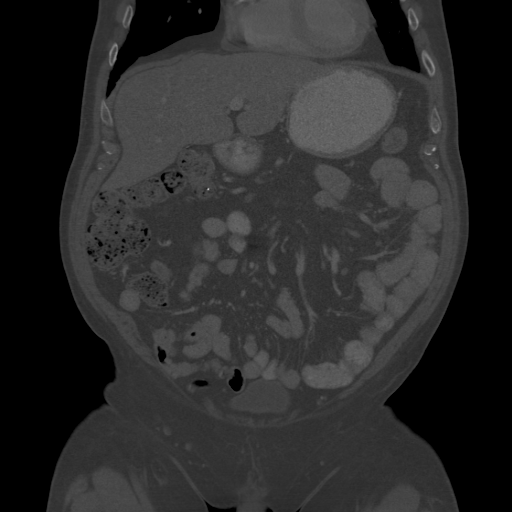

[Series 602: sagittal body · sagittal · 0.95mm/px · 4 of 170 slices shown]
[im 12/170  soft-tissue]
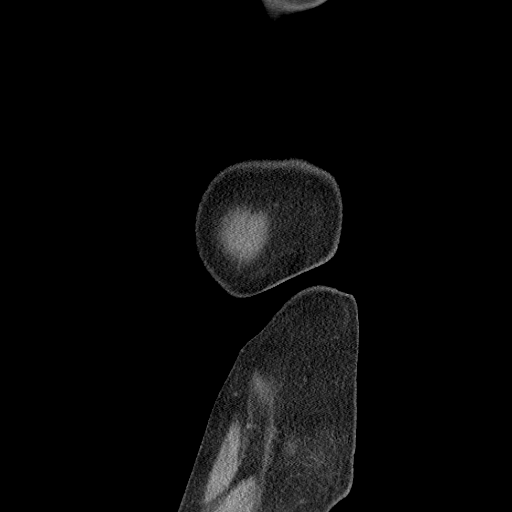
[im 34/170  soft-tissue]
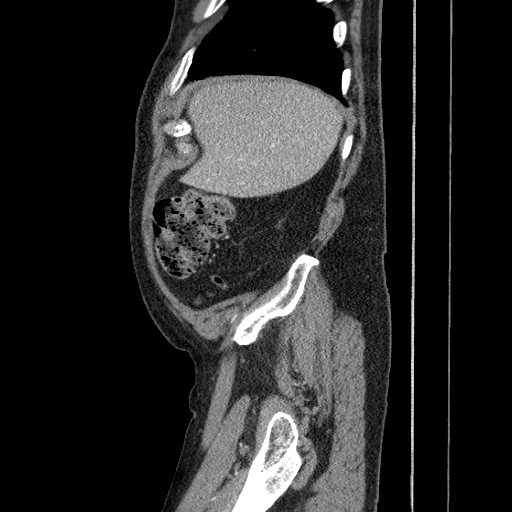
[im 57/170  soft-tissue]
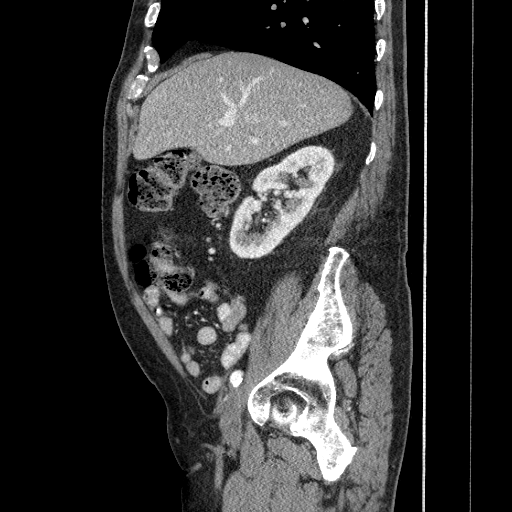
[im 79/170  soft-tissue]
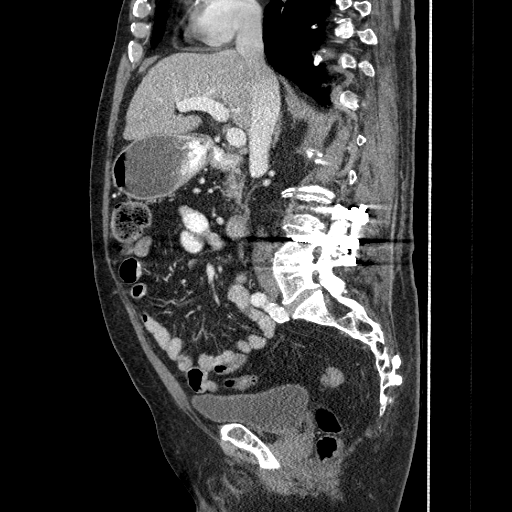

[12 of 36 positions shown; findings below may reference images not displayed]

FINDINGS: Small nodules at both lung bases are unchanged in size and
configuration, and in view of the intervening 2-1/2 years are
considered benign.

After contrast administration, the heart is mildly enlarged and
stable. The liver enhances with no focal abnormality and no ductal
dilatation is seen. Surgical clips are present from prior
cholecystectomy. The pancreas is normal in size and the pancreatic
duct is not dilated. The adrenal glands and spleen are unremarkable.
The stomach is distended with contrast and is unremarkable. The
kidneys enhance with a focus of calcification again noted in the
lower pole of the left kidney which is probably dystrophic with
adjacent atrophy present. There is a rounded low-attenuation
structure emanating from the lower right kidney posteriorly
measuring 2.4 cm with attenuation of 74 HU. This is an indeterminate
lesion and MRI is recommended to evaluate further. On delayed images
the pelvocaliceal systems are unremarkable. On the delayed phase
there is no washout from the lower pole right renal lesion. The
patient appears to have undergone repair of a AAA with dilatation
along the superior aspect of the repair measuring up to 3.1 x
cm. This is relatively stable compared to the prior CT where it
measured 3.2 x 3.6 cm. No adenopathy is seen.

The urinary bladder is moderately urine distended with no
abnormality noted. The prostate is within normal limits in size. No
pelvic mass or fluid is seen. There are a few rectosigmoid colonic
diverticula present. No abnormality of the colon is seen. The
terminal ileum and appendix are unremarkable. A small periumbilical
hernia is present which contains fat, and is unchanged. Hardware for
posterior fusion at L3-4 and L4-5 is noted.
IMPRESSION: 1. There is an indeterminate lesion emanating from the lower pole of
the right kidney. Recommend MRI to exclude a small renal cell
carcinoma.
2. Probable dystrophic calcification in the lower pole of the left
kidney with adjacent parenchymal atrophy.
3. Prior AAA repair with slight dilatation of the proximal native
suprarenal aorta, relatively stable compared to the prior CT.
4. Stable small noncalcified lung nodules.

## 2016-01-15 MED ORDER — CEFAZOLIN SODIUM-DEXTROSE 2-3 GM-% IV SOLR
2.0000 g | INTRAVENOUS | Status: AC
  Administered 2016-01-16: 2 g via INTRAVENOUS
  Filled 2016-01-15: qty 50

## 2016-01-15 NOTE — Progress Notes (Signed)
Anesthesia Chart Review:  Pt is a 75 year old male scheduled for R L5-S1 laminectomy on 01/16/2016 with Dr. Ellene Route.   Cardiologist is Dr. Daneen Schick.   PMH includes: CAD (inferior MI 1993, inferolateral MI 1999, RCA angioplasty, and OM1 and LAD stents), atrial fibrillation (s/p cardioversion 04/12/15), AAA (s/p open repair 2009), combined systolic and diastolic CHF, OSA (on CPAP), HTN, DM, hyperlipidemia, GERD. Former smoker. BMI 28. S/p posterior lumbar fusion 10/13/12 and 05/05/12.   Anesthesia history: his hair began to fall out after one surgery and he developed post-operative ileus with two prior surgeries.  Medications include: amiodarone, lipitor, empagliflozin, glimepiride, metformin, metoprolol, prilosec, ramipril, xarelto. Pt took last dose of xarelto 01/12/16.   Preoperative labs reviewed.  Glucose 176. HgbA1c was 7.7 on 11/2015.   Chest x-ray 06/29/15 reviewed. No acute cardiopulmonary process.   EKG 11/02/15: NSR. Nonspecific intraventricular block. Lateral infarct, age undetermined, inferior infarct, age undetermined, both noted to be "old" by Dr. Tamala Julian.   Echo 02/01/15:  - Left ventricle: The cavity size was normal. Systolic function was mildly to moderately reduced. The estimated ejection fraction was in the range of 40% to 45%. Diffuse hypokinesis. There is hypokinesis of the inferior myocardium. Doppler parameters are consistent with abnormal left ventricular relaxation (grade 1 diastolic dysfunction). - Aorta: Aortic root dimension: 40 mm (ED). - Ascending aorta: The ascending aorta was mildly dilated. - Left atrium: The atrium was moderately dilated.  Cardiac cath on 11/25/10 showed: 1. Patent left anterior descending coronary artery and left circumflex stent.  2. 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  3. Mild-to-moderate decreased left ventricular function, estimated ejection fraction of 40%.  Dr. Tamala Julian is aware of upcoming surgery.   If no changes, I  anticipate pt can proceed with surgery as scheduled.   Willeen Cass, FNP-BC [facility]/Anesthesiology Phone: [phone] 01/15/2016 1:40 PM

## 2016-01-16 ENCOUNTER — Encounter (HOSPITAL_COMMUNITY)
Admission: RE | Disposition: A | Discharge status: Home / Self Care | Payer: Self-pay | Source: Ambulatory Visit | Attending: Neurological Surgery

## 2016-01-16 ENCOUNTER — Ambulatory Visit (HOSPITAL_COMMUNITY): Payer: Medicare Other | Admitting: Anesthesiology

## 2016-01-16 ENCOUNTER — Ambulatory Visit (HOSPITAL_COMMUNITY): Payer: Medicare Other | Admitting: Emergency Medicine

## 2016-01-16 ENCOUNTER — Encounter (HOSPITAL_COMMUNITY): Payer: Self-pay | Admitting: *Deleted

## 2016-01-16 ENCOUNTER — Ambulatory Visit (HOSPITAL_COMMUNITY): Payer: Medicare Other

## 2016-01-16 ENCOUNTER — Observation Stay (HOSPITAL_COMMUNITY)
Admission: RE | Admit: 2016-01-16 | Discharge: 2016-01-17 | Disposition: A | Discharge status: Home / Self Care | Payer: Medicare Other | Source: Ambulatory Visit | Attending: Neurological Surgery | Admitting: Neurological Surgery

## 2016-01-16 DIAGNOSIS — M4806 Spinal stenosis, lumbar region: Secondary | ICD-10-CM | POA: Insufficient documentation

## 2016-01-16 DIAGNOSIS — Z87891 Personal history of nicotine dependence: Secondary | ICD-10-CM | POA: Insufficient documentation

## 2016-01-16 DIAGNOSIS — G4730 Sleep apnea, unspecified: Secondary | ICD-10-CM | POA: Insufficient documentation

## 2016-01-16 DIAGNOSIS — K219 Gastro-esophageal reflux disease without esophagitis: Secondary | ICD-10-CM | POA: Diagnosis not present

## 2016-01-16 DIAGNOSIS — M4726 Other spondylosis with radiculopathy, lumbar region: Secondary | ICD-10-CM | POA: Diagnosis present

## 2016-01-16 DIAGNOSIS — M21372 Foot drop, left foot: Secondary | ICD-10-CM | POA: Insufficient documentation

## 2016-01-16 DIAGNOSIS — M722 Plantar fascial fibromatosis: Secondary | ICD-10-CM | POA: Insufficient documentation

## 2016-01-16 DIAGNOSIS — E1140 Type 2 diabetes mellitus with diabetic neuropathy, unspecified: Secondary | ICD-10-CM | POA: Insufficient documentation

## 2016-01-16 DIAGNOSIS — Z955 Presence of coronary angioplasty implant and graft: Secondary | ICD-10-CM | POA: Diagnosis not present

## 2016-01-16 DIAGNOSIS — M21371 Foot drop, right foot: Secondary | ICD-10-CM | POA: Insufficient documentation

## 2016-01-16 DIAGNOSIS — Z7901 Long term (current) use of anticoagulants: Secondary | ICD-10-CM | POA: Diagnosis not present

## 2016-01-16 DIAGNOSIS — I10 Essential (primary) hypertension: Secondary | ICD-10-CM | POA: Diagnosis not present

## 2016-01-16 DIAGNOSIS — M5116 Intervertebral disc disorders with radiculopathy, lumbar region: Secondary | ICD-10-CM | POA: Insufficient documentation

## 2016-01-16 DIAGNOSIS — I4891 Unspecified atrial fibrillation: Secondary | ICD-10-CM | POA: Insufficient documentation

## 2016-01-16 DIAGNOSIS — I714 Abdominal aortic aneurysm, without rupture: Secondary | ICD-10-CM | POA: Insufficient documentation

## 2016-01-16 DIAGNOSIS — F329 Major depressive disorder, single episode, unspecified: Secondary | ICD-10-CM | POA: Insufficient documentation

## 2016-01-16 DIAGNOSIS — I252 Old myocardial infarction: Secondary | ICD-10-CM | POA: Diagnosis not present

## 2016-01-16 DIAGNOSIS — M4316 Spondylolisthesis, lumbar region: Secondary | ICD-10-CM | POA: Diagnosis not present

## 2016-01-16 DIAGNOSIS — Z7984 Long term (current) use of oral hypoglycemic drugs: Secondary | ICD-10-CM | POA: Insufficient documentation

## 2016-01-16 DIAGNOSIS — E7800 Pure hypercholesterolemia, unspecified: Secondary | ICD-10-CM | POA: Diagnosis not present

## 2016-01-16 DIAGNOSIS — G8929 Other chronic pain: Secondary | ICD-10-CM | POA: Insufficient documentation

## 2016-01-16 DIAGNOSIS — M549 Dorsalgia, unspecified: Secondary | ICD-10-CM | POA: Insufficient documentation

## 2016-01-16 DIAGNOSIS — Z888 Allergy status to other drugs, medicaments and biological substances status: Secondary | ICD-10-CM | POA: Insufficient documentation

## 2016-01-16 DIAGNOSIS — I2510 Atherosclerotic heart disease of native coronary artery without angina pectoris: Secondary | ICD-10-CM | POA: Insufficient documentation

## 2016-01-16 DIAGNOSIS — Z981 Arthrodesis status: Secondary | ICD-10-CM | POA: Insufficient documentation

## 2016-01-16 DIAGNOSIS — Z419 Encounter for procedure for purposes other than remedying health state, unspecified: Secondary | ICD-10-CM

## 2016-01-16 DIAGNOSIS — M5416 Radiculopathy, lumbar region: Secondary | ICD-10-CM | POA: Diagnosis present

## 2016-01-16 HISTORY — PX: LUMBAR LAMINECTOMY/DECOMPRESSION MICRODISCECTOMY: SHX5026

## 2016-01-16 LAB — GLUCOSE, CAPILLARY
Glucose-Capillary: 173 mg/dL — ABNORMAL HIGH (ref 65–99)
Glucose-Capillary: 107 mg/dL — ABNORMAL HIGH (ref 65–99)
Glucose-Capillary: 218 mg/dL — ABNORMAL HIGH (ref 65–99)

## 2016-01-16 SURGERY — LUMBAR LAMINECTOMY/DECOMPRESSION MICRODISCECTOMY 1 LEVEL
Anesthesia: General | Site: Back | Laterality: Right

## 2016-01-16 MED ORDER — LIDOCAINE HCL (CARDIAC) 20 MG/ML IV SOLN
INTRAVENOUS | Status: DC | PRN
  Administered 2016-01-16: 100 mg via INTRAVENOUS

## 2016-01-16 MED ORDER — PANTOPRAZOLE SODIUM 40 MG PO TBEC
40.0000 mg | DELAYED_RELEASE_TABLET | Freq: Every day | ORAL | Status: DC
  Filled 2016-01-16: qty 1

## 2016-01-16 MED ORDER — FLEET ENEMA 7-19 GM/118ML RE ENEM: 1.0000 | ENEMA | Freq: Once | RECTAL | Status: DC | PRN

## 2016-01-16 MED ORDER — PHENYLEPHRINE HCL 10 MG/ML IJ SOLN
10.0000 mg | INTRAVENOUS | Status: DC | PRN
  Administered 2016-01-16: 40 ug/min via INTRAVENOUS

## 2016-01-16 MED ORDER — DOCUSATE SODIUM 100 MG PO CAPS
100.0000 mg | ORAL_CAPSULE | Freq: Two times a day (BID) | ORAL | Status: DC
  Administered 2016-01-16: 100 mg via ORAL
  Filled 2016-01-16 (×2): qty 1

## 2016-01-16 MED ORDER — RAMIPRIL 10 MG PO CAPS
10.0000 mg | ORAL_CAPSULE | Freq: Every day | ORAL | Status: DC
  Filled 2016-01-16 (×2): qty 1

## 2016-01-16 MED ORDER — ONDANSETRON HCL 4 MG/2ML IJ SOLN: 4.0000 mg | INTRAMUSCULAR | Status: DC | PRN

## 2016-01-16 MED ORDER — DEXAMETHASONE SODIUM PHOSPHATE 4 MG/ML IJ SOLN
4.0000 mg | Freq: Once | INTRAMUSCULAR | Status: DC
  Filled 2016-01-16: qty 1

## 2016-01-16 MED ORDER — SODIUM CHLORIDE 0.9 % IR SOLN
Status: DC | PRN
  Administered 2016-01-16: 16:00:00

## 2016-01-16 MED ORDER — THROMBIN 5000 UNITS EX SOLR
CUTANEOUS | Status: DC | PRN
  Administered 2016-01-16 (×2): 5000 [IU] via TOPICAL

## 2016-01-16 MED ORDER — DIAZEPAM 5 MG PO TABS
5.0000 mg | ORAL_TABLET | Freq: Four times a day (QID) | ORAL | Status: DC | PRN
  Administered 2016-01-17: 5 mg via ORAL
  Filled 2016-01-16: qty 1

## 2016-01-16 MED ORDER — SUGAMMADEX SODIUM 500 MG/5ML IV SOLN
INTRAVENOUS | Status: DC | PRN
  Administered 2016-01-16: 195 mg via INTRAVENOUS

## 2016-01-16 MED ORDER — HYDROCODONE-ACETAMINOPHEN 5-325 MG PO TABS: 1.0000 | ORAL_TABLET | ORAL | Status: DC | PRN

## 2016-01-16 MED ORDER — METFORMIN HCL ER 500 MG PO TB24: 1000.0000 mg | ORAL_TABLET | Freq: Every day | ORAL | Status: DC

## 2016-01-16 MED ORDER — DEXAMETHASONE SODIUM PHOSPHATE 4 MG/ML IJ SOLN
INTRAMUSCULAR | Status: AC
  Filled 2016-01-16: qty 1

## 2016-01-16 MED ORDER — HYDROMORPHONE HCL 1 MG/ML IJ SOLN
0.2500 mg | INTRAMUSCULAR | Status: DC | PRN
  Administered 2016-01-16: 0.5 mg via INTRAVENOUS

## 2016-01-16 MED ORDER — ALUM & MAG HYDROXIDE-SIMETH 200-200-20 MG/5ML PO SUSP: 30.0000 mL | Freq: Four times a day (QID) | ORAL | Status: DC | PRN

## 2016-01-16 MED ORDER — VITAMIN B-6 100 MG PO TABS
100.0000 mg | ORAL_TABLET | Freq: Every day | ORAL | Status: DC
  Filled 2016-01-16 (×2): qty 1

## 2016-01-16 MED ORDER — OXYCODONE HCL 5 MG PO TABS: 5.0000 mg | ORAL_TABLET | Freq: Once | ORAL | Status: DC | PRN

## 2016-01-16 MED ORDER — SENNA 8.6 MG PO TABS
1.0000 | ORAL_TABLET | Freq: Two times a day (BID) | ORAL | Status: DC
  Administered 2016-01-16: 8.6 mg via ORAL
  Filled 2016-01-16 (×2): qty 1

## 2016-01-16 MED ORDER — POLYETHYLENE GLYCOL 3350 17 G PO PACK: 17.0000 g | PACK | Freq: Every day | ORAL | Status: DC | PRN

## 2016-01-16 MED ORDER — PROPOFOL 10 MG/ML IV BOLUS
INTRAVENOUS | Status: DC | PRN
  Administered 2016-01-16: 100 mg via INTRAVENOUS

## 2016-01-16 MED ORDER — INSULIN ASPART 100 UNIT/ML ~~LOC~~ SOLN
0.0000 [IU] | Freq: Every day | SUBCUTANEOUS | Status: DC
  Administered 2016-01-16: 2 [IU] via SUBCUTANEOUS

## 2016-01-16 MED ORDER — HYDROCODONE-ACETAMINOPHEN 5-325 MG PO TABS
1.0000 | ORAL_TABLET | ORAL | Status: DC | PRN
  Administered 2016-01-16 – 2016-01-17 (×2): 2 via ORAL
  Filled 2016-01-16 (×3): qty 2

## 2016-01-16 MED ORDER — GLIMEPIRIDE 2 MG PO TABS
2.0000 mg | ORAL_TABLET | Freq: Every day | ORAL | Status: DC
  Filled 2016-01-16: qty 1

## 2016-01-16 MED ORDER — METOPROLOL TARTRATE 25 MG PO TABS
50.0000 mg | ORAL_TABLET | Freq: Two times a day (BID) | ORAL | Status: DC
  Filled 2016-01-16 (×2): qty 2

## 2016-01-16 MED ORDER — PROMETHAZINE HCL 25 MG/ML IJ SOLN: 6.2500 mg | INTRAMUSCULAR | Status: DC | PRN

## 2016-01-16 MED ORDER — PROPOFOL 10 MG/ML IV BOLUS
INTRAVENOUS | Status: AC
  Filled 2016-01-16: qty 20

## 2016-01-16 MED ORDER — BUPIVACAINE HCL (PF) 0.5 % IJ SOLN
INTRAMUSCULAR | Status: DC | PRN
  Administered 2016-01-16 (×2): 10 mL

## 2016-01-16 MED ORDER — ONDANSETRON HCL 4 MG/2ML IJ SOLN
INTRAMUSCULAR | Status: AC
  Filled 2016-01-16: qty 2

## 2016-01-16 MED ORDER — ATORVASTATIN CALCIUM 20 MG PO TABS: 10.0000 mg | ORAL_TABLET | Freq: Every day | ORAL | Status: DC

## 2016-01-16 MED ORDER — LACTATED RINGERS IV SOLN
INTRAVENOUS | Status: DC
  Administered 2016-01-16 (×2): via INTRAVENOUS

## 2016-01-16 MED ORDER — ACETAMINOPHEN 325 MG PO TABS: 650.0000 mg | ORAL_TABLET | ORAL | Status: DC | PRN

## 2016-01-16 MED ORDER — OXYCODONE HCL 5 MG/5ML PO SOLN: 5.0000 mg | Freq: Once | ORAL | Status: DC | PRN

## 2016-01-16 MED ORDER — MORPHINE SULFATE (PF) 2 MG/ML IV SOLN: 1.0000 mg | INTRAVENOUS | Status: DC | PRN

## 2016-01-16 MED ORDER — AMIODARONE HCL 200 MG PO TABS
200.0000 mg | ORAL_TABLET | Freq: Every day | ORAL | Status: DC
Start: 2016-01-17 — End: 2016-01-17
  Filled 2016-01-16 (×2): qty 1

## 2016-01-16 MED ORDER — GLYCOPYRROLATE 0.2 MG/ML IJ SOLN
INTRAMUSCULAR | Status: DC | PRN
  Administered 2016-01-16: 0.2 mg via INTRAVENOUS

## 2016-01-16 MED ORDER — ACETAMINOPHEN 650 MG RE SUPP: 650.0000 mg | RECTAL | Status: DC | PRN

## 2016-01-16 MED ORDER — HYDROMORPHONE HCL 1 MG/ML IJ SOLN
INTRAMUSCULAR | Status: AC
  Filled 2016-01-16: qty 1

## 2016-01-16 MED ORDER — OXYCODONE-ACETAMINOPHEN 5-325 MG PO TABS
1.0000 | ORAL_TABLET | ORAL | Status: DC | PRN
  Filled 2016-01-16: qty 2

## 2016-01-16 MED ORDER — ONDANSETRON HCL 4 MG/2ML IJ SOLN
INTRAMUSCULAR | Status: DC | PRN
  Administered 2016-01-16: 4 mg via INTRAVENOUS

## 2016-01-16 MED ORDER — FENTANYL CITRATE (PF) 100 MCG/2ML IJ SOLN
INTRAMUSCULAR | Status: DC | PRN
  Administered 2016-01-16: 100 ug via INTRAVENOUS

## 2016-01-16 MED ORDER — KETOROLAC TROMETHAMINE 15 MG/ML IJ SOLN
INTRAMUSCULAR | Status: AC
  Filled 2016-01-16: qty 1

## 2016-01-16 MED ORDER — METFORMIN HCL ER 500 MG PO TB24
500.0000 mg | ORAL_TABLET | Freq: Every day | ORAL | Status: DC
  Filled 2016-01-16: qty 1

## 2016-01-16 MED ORDER — THROMBIN 5000 UNITS EX SOLR
OROMUCOSAL | Status: DC | PRN
  Administered 2016-01-16: 16:00:00 via TOPICAL

## 2016-01-16 MED ORDER — MENTHOL 3 MG MT LOZG: 1.0000 | LOZENGE | OROMUCOSAL | Status: DC | PRN

## 2016-01-16 MED ORDER — ROCURONIUM BROMIDE 100 MG/10ML IV SOLN
INTRAVENOUS | Status: DC | PRN
  Administered 2016-01-16: 50 mg via INTRAVENOUS

## 2016-01-16 MED ORDER — BISACODYL 10 MG RE SUPP: 10.0000 mg | Freq: Every day | RECTAL | Status: DC | PRN

## 2016-01-16 MED ORDER — CANAGLIFLOZIN 100 MG PO TABS
100.0000 mg | ORAL_TABLET | Freq: Every day | ORAL | Status: DC
  Filled 2016-01-16: qty 1

## 2016-01-16 MED ORDER — SODIUM CHLORIDE 0.9% FLUSH: 3.0000 mL | INTRAVENOUS | Status: DC | PRN

## 2016-01-16 MED ORDER — METHOCARBAMOL 1000 MG/10ML IJ SOLN: 500.0000 mg | Freq: Four times a day (QID) | INTRAMUSCULAR | Status: DC | PRN

## 2016-01-16 MED ORDER — PHENOL 1.4 % MT LIQD: 1.0000 | OROMUCOSAL | Status: DC | PRN

## 2016-01-16 MED ORDER — METFORMIN HCL ER 500 MG PO TB24: 500.0000 mg | ORAL_TABLET | Freq: Two times a day (BID) | ORAL | Status: DC

## 2016-01-16 MED ORDER — SODIUM CHLORIDE 0.9 % IV SOLN: 250.0000 mL | INTRAVENOUS | Status: DC

## 2016-01-16 MED ORDER — SODIUM CHLORIDE 0.9% FLUSH: 3.0000 mL | Freq: Two times a day (BID) | INTRAVENOUS | Status: DC

## 2016-01-16 MED ORDER — ARTIFICIAL TEARS OP OINT
TOPICAL_OINTMENT | OPHTHALMIC | Status: AC
  Filled 2016-01-16: qty 3.5

## 2016-01-16 MED ORDER — 0.9 % SODIUM CHLORIDE (POUR BTL) OPTIME
TOPICAL | Status: DC | PRN
  Administered 2016-01-16: 1000 mL

## 2016-01-16 MED ORDER — ONDANSETRON HCL 4 MG PO TABS: 4.0000 mg | ORAL_TABLET | Freq: Three times a day (TID) | ORAL | Status: DC | PRN

## 2016-01-16 MED ORDER — EPHEDRINE SULFATE 50 MG/ML IJ SOLN
INTRAMUSCULAR | Status: DC | PRN
  Administered 2016-01-16: 10 mg via INTRAVENOUS

## 2016-01-16 MED ORDER — HEMOSTATIC AGENTS (NO CHARGE) OPTIME
TOPICAL | Status: DC | PRN
  Administered 2016-01-16: 1 via TOPICAL

## 2016-01-16 MED ORDER — FENTANYL CITRATE (PF) 250 MCG/5ML IJ SOLN
INTRAMUSCULAR | Status: AC
  Filled 2016-01-16: qty 5

## 2016-01-16 MED ORDER — INSULIN ASPART 100 UNIT/ML ~~LOC~~ SOLN
0.0000 [IU] | Freq: Three times a day (TID) | SUBCUTANEOUS | Status: DC
Start: 2016-01-17 — End: 2016-01-17

## 2016-01-16 MED ORDER — LIDOCAINE-EPINEPHRINE 1 %-1:100000 IJ SOLN
INTRAMUSCULAR | Status: DC | PRN
  Administered 2016-01-16: 10 mL

## 2016-01-16 MED ORDER — ARTIFICIAL TEARS OP OINT
TOPICAL_OINTMENT | OPHTHALMIC | Status: DC | PRN
  Administered 2016-01-16: 1 via OPHTHALMIC

## 2016-01-16 MED ORDER — SUGAMMADEX SODIUM 500 MG/5ML IV SOLN
INTRAVENOUS | Status: AC
  Filled 2016-01-16: qty 5

## 2016-01-16 MED ORDER — METHOCARBAMOL 500 MG PO TABS
500.0000 mg | ORAL_TABLET | Freq: Four times a day (QID) | ORAL | Status: DC | PRN
  Administered 2016-01-16: 500 mg via ORAL
  Filled 2016-01-16: qty 1

## 2016-01-16 MED ORDER — KETOROLAC TROMETHAMINE 15 MG/ML IJ SOLN
15.0000 mg | Freq: Four times a day (QID) | INTRAMUSCULAR | Status: DC
  Administered 2016-01-16 – 2016-01-17 (×3): 15 mg via INTRAVENOUS
  Filled 2016-01-16 (×2): qty 1

## 2016-01-16 SURGICAL SUPPLY — 50 items
BAG DECANTER FOR FLEXI CONT (MISCELLANEOUS) ×2 IMPLANT
BLADE CLIPPER SURG (BLADE) IMPLANT
BUR ACORN 6.0 (BURR) IMPLANT
BUR MATCHSTICK NEURO 3.0 LAGG (BURR) ×2 IMPLANT
CANISTER SUCT 3000ML PPV (MISCELLANEOUS) ×4 IMPLANT
DECANTER SPIKE VIAL GLASS SM (MISCELLANEOUS) ×2 IMPLANT
DERMABOND ADVANCED (GAUZE/BANDAGES/DRESSINGS) ×1
DERMABOND ADVANCED .7 DNX12 (GAUZE/BANDAGES/DRESSINGS) ×1 IMPLANT
DEVICE DISSECT PLASMABLAD 3.0S (MISCELLANEOUS) ×1 IMPLANT
DRAPE LAPAROTOMY T 102X78X121 (DRAPES) ×2 IMPLANT
DRAPE MICROSCOPE LEICA (MISCELLANEOUS) ×2 IMPLANT
DRAPE POUCH INSTRU U-SHP 10X18 (DRAPES) ×2 IMPLANT
DRAPE PROXIMA HALF (DRAPES) IMPLANT
DRSG OPSITE POSTOP 4X6 (GAUZE/BANDAGES/DRESSINGS) ×2 IMPLANT
DURAPREP 26ML APPLICATOR (WOUND CARE) ×2 IMPLANT
ELECT REM PT RETURN 9FT ADLT (ELECTROSURGICAL) ×2
ELECTRODE REM PT RTRN 9FT ADLT (ELECTROSURGICAL) ×1 IMPLANT
GAUZE SPONGE 4X4 12PLY STRL (GAUZE/BANDAGES/DRESSINGS) IMPLANT
GAUZE SPONGE 4X4 16PLY XRAY LF (GAUZE/BANDAGES/DRESSINGS) IMPLANT
GLOVE BIOGEL PI IND STRL 8.5 (GLOVE) ×1 IMPLANT
GLOVE BIOGEL PI INDICATOR 8.5 (GLOVE) ×1
GLOVE ECLIPSE 8.5 STRL (GLOVE) ×2 IMPLANT
GLOVE EXAM NITRILE LRG STRL (GLOVE) IMPLANT
GLOVE EXAM NITRILE MD LF STRL (GLOVE) IMPLANT
GLOVE EXAM NITRILE XL STR (GLOVE) IMPLANT
GLOVE EXAM NITRILE XS STR PU (GLOVE) IMPLANT
GOWN STRL REUS W/ TWL LRG LVL3 (GOWN DISPOSABLE) IMPLANT
GOWN STRL REUS W/ TWL XL LVL3 (GOWN DISPOSABLE) IMPLANT
GOWN STRL REUS W/TWL 2XL LVL3 (GOWN DISPOSABLE) ×2 IMPLANT
GOWN STRL REUS W/TWL LRG LVL3 (GOWN DISPOSABLE)
GOWN STRL REUS W/TWL XL LVL3 (GOWN DISPOSABLE)
HEMOSTAT POWDER SURGIFOAM 1G (HEMOSTASIS) ×2 IMPLANT
KIT BASIN OR (CUSTOM PROCEDURE TRAY) ×2 IMPLANT
KIT ROOM TURNOVER OR (KITS) ×2 IMPLANT
NEEDLE HYPO 22GX1.5 SAFETY (NEEDLE) ×2 IMPLANT
NEEDLE SPNL 20GX3.5 QUINCKE YW (NEEDLE) ×2 IMPLANT
NS IRRIG 1000ML POUR BTL (IV SOLUTION) ×2 IMPLANT
PACK LAMINECTOMY NEURO (CUSTOM PROCEDURE TRAY) ×2 IMPLANT
PAD ARMBOARD 7.5X6 YLW CONV (MISCELLANEOUS) ×6 IMPLANT
PATTIES SURGICAL .5 X1 (DISPOSABLE) IMPLANT
PLASMABLADE 3.0S (MISCELLANEOUS) ×2
RUBBERBAND STERILE (MISCELLANEOUS) ×4 IMPLANT
SPONGE SURGIFOAM ABS GEL SZ50 (HEMOSTASIS) ×2 IMPLANT
SUT VIC AB 1 CT1 18XBRD ANBCTR (SUTURE) ×1 IMPLANT
SUT VIC AB 1 CT1 8-18 (SUTURE) ×1
SUT VIC AB 2-0 CP2 18 (SUTURE) ×2 IMPLANT
SUT VIC AB 3-0 SH 8-18 (SUTURE) ×2 IMPLANT
TOWEL OR 17X24 6PK STRL BLUE (TOWEL DISPOSABLE) ×2 IMPLANT
TOWEL OR 17X26 10 PK STRL BLUE (TOWEL DISPOSABLE) ×2 IMPLANT
WATER STERILE IRR 1000ML POUR (IV SOLUTION) ×2 IMPLANT

## 2016-01-16 NOTE — H&P (Signed)
[name] is an 75 y.o. male.   Chief Complaint: Bilateral leg pain right greater than left with weakness  right leg HPI: [name] is a 75 year old male whom I've treated in the past he's had decompression and fusion from L2-L5. He has pain in both lower extremities, particularly he has the severe spondylosis and stenosis at the L5-S1 level in the lateral recesses this causes compression primarily in the S1 nerve root but also for the L5 foramen. He's been advised regarding surgical decompression this region particularly at L5-S1 on the right side. He is now admitted for this procedure.  Past Medical History  Diagnosis Date  . Hypercholesteremia     takes lipitor daily  . AAA (abdominal aortic aneurysm) ([place])   . Pain     back pain chronic- seen at pain clinic  . 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 Bronchitis     hx of  . Ileus ([place])     After AAA  . Arthritis     BacK  . Incisional hernia     "small, from AAA"  . Coronary atherosclerosis of native coronary artery     ejection fraction of 40%, prior inferior myocardial infarction in February 1993, PTCA, LAD rotational atherectomy in August of 1998, acute inferolateral myocardial infarction, ventricular fibrillation in July of 1999, PTCA and stent of obtuse marginal 1, 100% RCA 1/12 with collaterals from patent circumflex and LAD  . Hypertension     takes Ramipril daily  . MI (myocardial infarction) ([place])     1992 and 1997 sees Dr. Linard Millers as needed   . Sleep apnea     uses CPAP--sleep study done at least 79yr ago  . Chronic back pain   . Dry skin   . Acid reflux     takes Prilosec and Protonix daily  . Complication of anesthesia     -years ago hair fell out.;ileus after 2 of his surgeries  . Plantar fasciitis     bilatetral  . Diabetes mellitus     DM II  . Foot drop, bilateral   . Dysrhythmia     atrial fibrillation    Past Surgical History  Procedure Laterality Date  . Abdominal surgery    . Abdominal aortic  aneurysm repair    . Colonoscopy    . Cholecystectomy    . Back surgery  2013    Miminal Invasive x 3in [place].  . Cardiac catheterization  2009/2012  . Coronary angioplasty      2 stents  . Coronary stent placement    . Cardioversion N/A 04/12/2015    Procedure: CARDIOVERSION;  Surgeon: TSueanne Margarita MD;  Location: [facility];  Service: Cardiovascular;  Laterality: N/A;    Family History  Problem Relation Age of Onset  . Diabetes Mother   . Coronary artery disease Mother   . Diabetes Father   . Arthritis Father   . Rheum arthritis Father   . Coronary artery disease Father    Social History:  reports that he quit smoking about 24 years ago. He has never used smokeless tobacco. He reports that he drinks alcohol. He reports that he does not use illicit drugs.  Allergies:  Allergies  Allergen Reactions  . Cymbalta [Duloxetine Hcl] Nausea And Vomiting and Other (See Comments)    Rapid drop in blood pressure  . Lyrica [Pregabalin] Other (See Comments)    Makes me goofy, keeps me off balance, clouds my thinking  Medications Prior to Admission  Medication Sig Dispense Refill  . amiodarone (PACERONE) 200 MG tablet Take 1 tablet (200 mg total) by mouth daily.    [name] atorvastatin (LIPITOR) 10 MG tablet Take 10 mg by mouth daily.    . Coenzyme Q10 (CO Q 10) 100 MG CAPS Take 100 mg by mouth daily.    . diazepam (VALIUM) 5 MG tablet Take 5 mg by mouth every 6 (six) hours as needed. (spasms)    . empagliflozin (JARDIANCE) 10 MG TABS tablet Take 10 mg by mouth daily.    [name] glimepiride (AMARYL) 2 MG tablet Take 2 mg by mouth daily.    [name] HYDROcodone-acetaminophen (NORCO) 5-325 MG tablet Take 1 - 2 tablets by mouth every 6 hours as needed for pain.    . metFORMIN (GLUCOPHAGE-XR) 500 MG 24 hr tablet Take 500-1,000 mg by mouth 2 (two) times daily. '500mg'$  in the morning, '1000mg'$  in the evening    . metoprolol (LOPRESSOR) 50 MG tablet TAKE ONE TABLET BY MOUTH TWICE DAILY 60 tablet 10  .  Multiple Vitamin (MULTIVITAMIN WITH MINERALS) TABS Take 1 tablet by mouth daily.    [name] omeprazole (PRILOSEC) 20 MG capsule Take 20 mg by mouth daily.    [name] pyridOXINE (VITAMIN B-6) 100 MG tablet Take 100 mg by mouth daily.    . ramipril (ALTACE) 10 MG capsule Take 10 mg by mouth daily.    . rivaroxaban (XARELTO) 20 MG TABS tablet Take 1 tablet (20 mg total) by mouth daily with supper. 30 tablet 5  . vitamin B-12 (CYANOCOBALAMIN) 1000 MCG tablet Take 5,000 mcg by mouth daily.     . ondansetron (ZOFRAN) 4 MG tablet Take 4 mg by mouth 3 (three) times daily as needed. (nausea)      Results for orders placed or performed during the hospital encounter of 01/16/16 (from the past 48 hour(s))  Glucose, capillary     Status: Abnormal   Collection Time: 01/16/16 12:33 PM  Result Value Ref Range   Glucose-Capillary 173 (H) 65 - 99 mg/dL   No results found.  Review of Systems  HENT: Negative.   Eyes: Negative.   Respiratory: Negative.   Cardiovascular: Negative.   Gastrointestinal: Negative.   Genitourinary: Negative.   Musculoskeletal: Positive for back pain.  Skin: Negative.   Neurological: Positive for tingling, sensory change, focal weakness and weakness.  Endo/Heme/Allergies: Negative.   Psychiatric/Behavioral: Negative.     Blood pressure 106/62, pulse 62, temperature 98.1 F (36.7 C), temperature source Oral, resp. rate 18, height 6' 0.5" (1.842 m), weight 96.163 kg (212 lb), SpO2 100 %. Physical Exam  Constitutional: He is oriented to person, place, and time. He appears well-developed and well-nourished.  HENT:  Head: Normocephalic and atraumatic.  Eyes: Conjunctivae and EOM are normal. Pupils are equal, round, and reactive to light.  Neck: Normal range of motion. Neck supple.  Cardiovascular: Normal rate and regular rhythm.   Respiratory: Effort normal and breath sounds normal.  GI: Soft. Bowel sounds are normal.  Musculoskeletal:  Positive straight leg raising on the right at  15 on the left at 30  Neurological: He is alert and oriented to person, place, and time.  Absent deep tendon reflexes in both lower extremities. Modest weakness in tibialis anterior on the right graded at 4 minus out of 5. Sensory exam shows diminished pin and light touch sensation right lower extremity compared to the left. Patient and gait are intact.  Skin: Skin is warm and dry.  Psychiatric: He has a normal mood and affect. His behavior is normal. Judgment and thought content normal.     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  Laminotomy and foraminotomies and decompression of L5 and S1 nerve roots on the right.  Earleen Newport, MD 01/16/2016, 2:52 PM

## 2016-01-16 NOTE — Anesthesia Procedure Notes (Signed)
Procedure Name: Intubation Date/Time: 01/16/2016 3:23 PM Performed by: Trixie Deis A Pre-anesthesia Checklist: Patient identified, Timeout performed, Emergency Drugs available, Suction available and Patient being monitored Patient Re-evaluated:Patient Re-evaluated prior to inductionOxygen Delivery Method: Circle system utilized Preoxygenation: Pre-oxygenation with 100% oxygen Intubation Type: IV induction Ventilation: Mask ventilation without difficulty and Oral airway inserted - appropriate to patient size Laryngoscope Size: Mac and 4 Grade View: Grade I Tube type: Oral Tube size: 7.5 mm Number of attempts: 1 Airway Equipment and Method: Stylet Placement Confirmation: ETT inserted through vocal cords under direct vision,  breath sounds checked- equal and bilateral and positive ETCO2 Secured at: 23 cm Tube secured with: Tape Dental Injury: Teeth and Oropharynx as per pre-operative assessment

## 2016-01-16 NOTE — Op Note (Signed)
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 Anesthesia: Gen. endotracheal Indications: [name] is a 75 year old individual who over a number of years is had a decompression and fusion from L2 down to L5. He's had degenerative disc disease and spondylolisthesis at a number of levels. He has been having right buttock and right hip pain for a long time and I treated him initially with transforaminal epidural injections at the L5-S1 level which seemed to help initially he has subsequently been refractory to these injections and recent myelogram demonstrated the severe stenosis at the exit foramen for the L5 nerve root and the traversing nerve root of S1 he is now taken to the operating room to undergo surgical decompression  Procedure: Patient was brought to the operating room supine on a stretcher. After the smooth induction of general endotracheal anesthesia, he was turned prone. The back was prepped with alcohol and DuraPrep and draped in a sterile fashion. A small vertical incision was created in the lower lumbar spine approximately over the L5-S1 region. Subperiosteal dissection was performed and a localizing radiograph identified L5-S1 positively. Then with the lamina of L5 being positively identified and the soft tissues being cleared laminotomy was created removing the inferior margin lamina of L5 out to the medial wall the facet. A partial mesial facetectomy was performed. The oh ligament was being taken up as an noted that there was substantial overgrowth from the superior aspect of the facet joint medially causing compression on the dural  tube and the take off the L5 nerve root. This tissue was decompressed and allowed for good passage of the L5 nerve root laterally into the foramen. Then the S1 nerve root was decompressed in a similar fashion and laminotomy was enlarged under the operating microscope using microdissection technique the common dural tube was explored and underneath was noted to be a posterior bulge of the disc however the ligament itself was very tight and firm and no fragments of disc were noted to be poking through the ligament itself. Ligament was left intact. With the common dural tube the L5 and S1 nerve roots being well decompressed hemostasis was achieved in the soft tissues retractor was removed the microscope was removed and then the lumbar dorsal fascia was closed with #1 Vicryl in interrupted fashion, 2-0 Vicryl in subcutaneous tissues, 3-0 Vicryl subcuticularly. Blood loss is estimated at 75 mL.

## 2016-01-16 NOTE — Transfer of Care (Signed)
Immediate Anesthesia Transfer of Care Note  Patient: [name]  Procedure(s) Performed: Procedure(s) with comments: Right Lumbar five-Sacral one Laminectomy (Right) - Right L5-S1 Laminectomy  Patient Location: PACU  Anesthesia Type:General  Level of Consciousness: awake, alert  and oriented  Airway & Oxygen Therapy: Patient Spontanous Breathing and Patient connected to nasal cannula oxygen  Post-op Assessment: Report given to RN, Post -op Vital signs reviewed and stable and Patient moving all extremities  Post vital signs: Reviewed and stable  Last Vitals:  Filed Vitals:   01/16/16 1226 01/16/16 1657  BP: 106/62 121/73  Pulse: 62 72  Temp: 36.7 C 36.6 C  Resp: 18 10    Complications: No apparent anesthesia complications

## 2016-01-16 NOTE — Anesthesia Preprocedure Evaluation (Addendum)
Anesthesia Evaluation  Patient identified by MRN, date of birth, ID band Patient awake    Reviewed: Allergy & Precautions, H&P , NPO status , Patient's Chart, lab work & pertinent test results, reviewed documented beta blocker date and time   Airway Mallampati: I  TM Distance: >3 FB Neck ROM: Full    Dental no notable dental hx. (+) Partial Lower, Partial Upper, Dental Advisory Given   Pulmonary sleep apnea and Continuous Positive Airway Pressure Ventilation , former smoker,    Pulmonary exam normal breath sounds clear to auscultation       Cardiovascular hypertension, On Medications and On Home Beta Blockers + CAD, + Past MI and + Peripheral Vascular Disease  + dysrhythmias Atrial Fibrillation  Rhythm:Regular Rate:Normal  Patient with Echo 2016 with EF 40-45%, diffuse hypokinesis  Cath 2012 with patent stents in LAD and LCx  No signs or symptoms of exertional chest pain, syncope or congestive heart failure currently  He is s/p open AAA repair, s/p DCCV for afib in 2016.Julienne Kass GA with ETT for posterior fusions in 2012 well   Neuro/Psych Depression negative neurological ROS  negative psych ROS   GI/Hepatic Neg liver ROS, GERD  Medicated,  Endo/Other  negative endocrine ROSdiabetes, Well Controlled, Oral Hypoglycemic Agents  Renal/GU negative Renal ROS  negative genitourinary   Musculoskeletal   Abdominal   Peds  Hematology negative hematology ROS (+)   Anesthesia Other Findings   Reproductive/Obstetrics negative OB ROS                           Anesthesia Physical  Anesthesia Plan  ASA: III  Anesthesia Plan: General   Post-op Pain Management:    Induction: Intravenous  Airway Management Planned: Oral ETT  Additional Equipment:   Intra-op Plan:   Post-operative Plan: Extubation in OR  Informed Consent: I have reviewed the patients History and Physical, chart, labs and  discussed the procedure including the risks, benefits and alternatives for the proposed anesthesia with the patient or authorized representative who has indicated his/her understanding and acceptance.     Plan Discussed with: CRNA and Surgeon  Anesthesia Plan Comments:         Anesthesia Quick Evaluation

## 2016-01-17 ENCOUNTER — Encounter (HOSPITAL_COMMUNITY): Payer: Self-pay | Admitting: Neurological Surgery

## 2016-01-17 DIAGNOSIS — M4726 Other spondylosis with radiculopathy, lumbar region: Secondary | ICD-10-CM | POA: Diagnosis not present

## 2016-01-17 LAB — GLUCOSE, CAPILLARY: Glucose-Capillary: 182 mg/dL — ABNORMAL HIGH (ref 65–99)

## 2016-01-17 MED ORDER — METHOCARBAMOL 500 MG PO TABS: 500.0000 mg | ORAL_TABLET | Freq: Four times a day (QID) | ORAL | Status: DC | PRN

## 2016-01-17 MED ORDER — HYDROCODONE-ACETAMINOPHEN 5-325 MG PO TABS: 1.0000 | ORAL_TABLET | ORAL | Status: DC | PRN

## 2016-01-17 NOTE — Anesthesia Postprocedure Evaluation (Signed)
Anesthesia Post Note  Patient: [name]  Procedure(s) Performed: Procedure(s) (LRB): Right Lumbar five-Sacral one Laminectomy (Right)  Patient location during evaluation: PACU Anesthesia Type: General Level of consciousness: awake and alert Pain management: pain level controlled Vital Signs Assessment: post-procedure vital signs reviewed and stable Respiratory status: spontaneous breathing, nonlabored ventilation, respiratory function stable and patient connected to nasal cannula oxygen Cardiovascular status: blood pressure returned to baseline and stable Postop Assessment: no signs of nausea or vomiting Anesthetic complications: no             Zenaida Deed

## 2016-01-17 NOTE — Progress Notes (Signed)
Patient alert and oriented, mae's well, voiding adequate amount of urine, swallowing without difficulty, no c/o pain. Patient discharged home with family. Script and discharged instructions given to patient. Patient and family stated understanding of d/c instructions given and has an appointment with MD. 

## 2016-01-17 NOTE — Discharge Summary (Signed)
Physician Discharge Summary  Patient ID: [name] MRN: [id number] DOB/AGE: 03-11-1941 75 y.o.  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 He tolerated the surgery well.  Consults: None  Significant Diagnostic Studies: None  Treatments: surgery: Micro-laminotomy and foraminotomies L5-S1 right with operating microscope microdissection technique  Discharge Exam: Blood pressure 110/70, pulse 63, temperature 97.7 F (36.5 C), temperature source Oral, resp. rate 18, height 6' 0.5" (1.842 m), weight 96.163 kg (212 lb), SpO2 100 %. Incision is clean and dry, station and gait are intact.  Disposition: 01-Home or Self Care  Discharge Instructions    Call MD for:  redness, tenderness, or signs of infection (pain, swelling, redness, odor or green/yellow discharge around incision site)    Complete by:  As directed      Call MD for:  severe uncontrolled pain    Complete by:  As directed      Call MD for:  temperature >100.4    Complete by:  As directed      Diet - low sodium heart healthy    Complete by:  As directed      Discharge instructions    Complete by:  As directed   Okay to shower. Do not apply salves or appointments to incision. No heavy lifting with the upper extremities greater than 15 pounds. May resume driving when not requiring pain medication and patient feels comfortable with doing so.     Increase activity slowly    Complete by:  As directed             Medication List    TAKE these medications        amiodarone 200 MG tablet   Commonly known as:  PACERONE  Take 1 tablet (200 mg total) by mouth daily.     atorvastatin 10 MG tablet  Commonly known as:  LIPITOR  Take 10 mg by mouth daily.     Co Q 10 100 MG Caps  Take 100 mg by mouth daily.     diazepam 5 MG tablet  Commonly known as:  VALIUM  Take 5 mg by mouth every 6 (six) hours as needed. (spasms)     glimepiride 2 MG tablet  Commonly known as:  AMARYL  Take 2 mg by mouth daily.     JARDIANCE 10 MG Tabs tablet  Generic drug:  empagliflozin  Take 10 mg by mouth daily.     metFORMIN 500 MG 24 hr tablet  Commonly known as:  GLUCOPHAGE-XR  Take 500-1,000 mg by mouth 2 (two) times daily. '500mg'$  in the morning, '1000mg'$  in the evening     methocarbamol 500 MG tablet  Commonly known as:  ROBAXIN  Take 1 tablet (500 mg total) by mouth every 6 (six) hours as needed for muscle spasms.     metoprolol 50 MG tablet  Commonly known as:  LOPRESSOR  TAKE ONE TABLET BY MOUTH TWICE DAILY     multivitamin with minerals Tabs tablet  Take 1 tablet by mouth daily.     NORCO 5-325 MG tablet  Generic drug:  HYDROcodone-acetaminophen  Take 1 - 2 tablets by mouth every 6 hours as  needed for pain.     HYDROcodone-acetaminophen 5-325 MG tablet  Commonly known as:  NORCO/VICODIN  Take 1-2 tablets by mouth every 4 (four) hours as needed for moderate pain.     omeprazole 20 MG capsule  Commonly known as:  PRILOSEC  Take 20 mg by mouth daily.     ondansetron 4 MG tablet  Commonly known as:  ZOFRAN  Take 4 mg by mouth 3 (three) times daily as needed. (nausea)     pyridOXINE 100 MG tablet  Commonly known as:  VITAMIN B-6  Take 100 mg by mouth daily.     ramipril 10 MG capsule  Commonly known as:  ALTACE  Take 10 mg by mouth daily.     rivaroxaban 20 MG Tabs tablet  Commonly known as:  XARELTO  Take 1 tablet (20 mg total) by mouth daily with supper.     vitamin B-12 1000 MCG tablet  Commonly known as:  CYANOCOBALAMIN  Take 5,000 mcg by mouth daily.          SignedEarleen Newport 01/17/2016, 9:30 AM

## 2016-01-17 NOTE — Evaluation (Signed)
Occupational Therapy Evaluation Patient Details Name: [name] MRN: [id number] DOB: 1941/04/05 Today's Date: 01/17/2016    History of Present Illness Pt is a 75 y/o male who presents s/p L5-S1 laminectomy/decompression on 01/16/16.   Clinical Impression   Patient evaluated by Occupational Therapy with no further acute OT needs identified. All education has been completed and the patient has no further questions. See below for any follow-up Occupational Therapy or equipment needs. OT to sign off. Thank you for referral.       Follow Up Recommendations  No OT follow up    Equipment Recommendations  Other (comment) (RW)    Recommendations for Other Services       Precautions / Restrictions Precautions Precautions: Fall;Back Precaution Booklet Issued: Yes (comment) Precaution Comments: handout provided and reviewed in detail Restrictions Weight Bearing Restrictions: No      Mobility Bed Mobility Overal bed mobility: Modified Independent                Transfers Overall transfer level: Needs assistance   Transfers: Sit to/from Stand Sit to Stand: Supervision         General transfer comment: educated on hand placement and not to pull on RW. pt able to complete task and did it a second time for personal practice    Balance Overall balance assessment: Needs assistance                             High Level Balance Comments: Pt abandoning RW and reaching for environmental supports during session. In bathroom OT removing RW and asked patient to complete shower transfer. Pt states but I dont have anything to hold onto here . When asked to demo normal home transfer in bathroom pt demonstrates reaching for walls, shower frame, door ways and counter tops. pt educated on fall risk due to this furniture walking .             ADL Overall ADL's : Needs assistance/impaired Eating/Feeding: Independent                   Lower Body Dressing:  Supervision/safety;Bed level Lower Body Dressing Details (indicate cue type and reason): able to cross bil LE in supine Toilet Transfer: Supervision/safety       Tub/ Shower Transfer: Walk-in shower;Supervision/safety;Grab bars;Ambulation Tub/Shower Transfer Details (indicate cue type and reason): demontrated and educated on safety Functional mobility during ADLs: Supervision/safety;Rolling walker General ADL Comments: educated on the need to use RW due to furniture walking. pt expressed concerns with always and forever having to use RW. Pt educated on BOS with RW use. Patient appeared to be more receptive to RW after I told him i am not telling him he has to use it forever. Pt s wife with questions about pending beach trip in 4 weeks     Vision     Perception     Praxis      Pertinent Vitals/Pain       Hand Dominance Right   Extremity/Trunk Assessment Upper Extremity Assessment Upper Extremity Assessment: Overall WFL for tasks assessed   Lower Extremity Assessment Lower Extremity Assessment: Defer to PT evaluation   Cervical / Trunk Assessment Cervical / Trunk Assessment: Other exceptions (s/p surg)   Communication Communication Communication: No difficulties   Cognition Arousal/Alertness: Awake/alert Behavior During Therapy: WFL for tasks assessed/performed Overall Cognitive Status: Within Functional Limits for tasks assessed  General Comments       Exercises       Shoulder Instructions      Home Living Family/patient expects to be discharged to:: Private residence Living Arrangements: Spouse/significant other Available Help at Discharge: Family;Available 24 hours/day Type of Home: House Home Access: Stairs to enter [employer] of Steps: 6 - 3 steps x2 Entrance Stairs-Rails: Right Home Layout: Two level;Able to live on main level with bedroom/bathroom     Bathroom Shower/Tub: Occupational psychologist:  Standard     Home Equipment: Environmental consultant - 2 wheels;Shower seat;Grab bars - toilet;Grab bars - tub/shower;Hand held shower head          Prior Functioning/Environment Level of Independence: Independent with assistive device(s)  Gait / Transfers Assistance Needed: cane for transfer          OT Diagnosis: Acute pain   OT Problem List:     OT Treatment/Interventions:      OT Goals(Current goals can be found in the care plan section)    OT Frequency:     Barriers to D/C:            Co-evaluation              End of Session Equipment Utilized During Treatment: Gait belt;Rolling walker (has old back brace Dr Ellene Route states can wear for comfort) Nurse Communication: Mobility status;Precautions  Activity Tolerance: Patient tolerated treatment well Patient left: Other (comment) (with PT Mickel Baas)   Time: 6948-5462 OT Time Calculation (min): 34 min Charges:  OT General Charges $OT Visit: 1 Procedure OT Evaluation $OT Eval Moderate Complexity: 1 Procedure OT Treatments $Self Care/Home Management : 8-22 mins G-Codes: OT G-codes **NOT FOR INPATIENT CLASS** Functional Assessment Tool Used: clinical judgement Functional Limitation: Self care Self Care Current Status (V0350): At least 1 percent but less than 20 percent impaired, limited or restricted Self Care Goal Status (K9381): At least 1 percent but less than 20 percent impaired, limited or restricted Self Care Discharge Status [phone]): At least 1 percent but less than 20 percent impaired, limited or restricted  Peri Maris 01/17/2016, 11:09 AM    Jeri Modena   OTR/L Pager: [phone] Office: [phone] .

## 2016-01-17 NOTE — Evaluation (Signed)
Physical Therapy Evaluation Patient Details Name: [name] MRN: [id number] DOB: 07-09-1941 Today's Date: 01/17/2016   History of Present Illness  Pt is a 75 y/o male who presents s/p L5-S1 laminectomy/decompression on 01/16/16.  Clinical Impression  Pt admitted with above diagnosis. Pt currently with functional limitations due to the deficits listed below (see PT Problem List). At the time of PT eval pt was able to perform transfers and ambulation with min guard to min assist. Overall safety awareness is fair to poor, especially with pt's understanding of need for RW use. Pt will benefit from skilled PT to increase their independence and safety with mobility to allow discharge to the venue listed below.       Follow Up Recommendations Outpatient PT;Supervision for mobility/OOB    Equipment Recommendations  None recommended by PT (Wife is not sure if they have a RW. May need to order if the walker they have is standard.)    Recommendations for Other Services       Precautions / Restrictions Precautions Precautions: Fall;Back Precaution Booklet Issued: Yes (comment) Precaution Comments: Handout reviewed and pt was cued for precautions during functional mobility.  Restrictions Weight Bearing Restrictions: No      Mobility  Bed Mobility Overal bed mobility: Modified Independent             General bed mobility comments: Pt received standing with OT in bathroom  Transfers Overall transfer level: Needs assistance Equipment used: Rolling walker (2 wheeled) Transfers: Sit to/from Stand Sit to Stand: Supervision         General transfer comment: VC's for hand placement on seated surface for safety. Pt still wanting to hold to RW bilaterally even with cues.   Ambulation/Gait Ambulation/Gait assistance: Min assist Ambulation Distance (Feet): 350 Feet Assistive device: Rolling walker (2 wheeled) Gait Pattern/deviations: Step-through pattern;Decreased stride length;Trunk  flexed Gait velocity: Decreased Gait velocity interpretation: Below normal speed for age/gender General Gait Details: VC's for improved posture and general safety. Pt required occasional min assist for balance or to maintain precautions.   Stairs Stairs: Yes Stairs assistance: Min guard Stair Management: One rail Right;Sideways;Step to pattern Number of Stairs: 3 (x2 trials) General stair comments: VC's for proper side stepping on stairs. Pt initially twisting to hold the railing with both hands and did not immediately understand that he was breaking his precautions or why I was asking him to turn to face the railing prior to side stepping up. Wife was also educated on proper positioning and sequencing to help remind pt at home.   Wheelchair Mobility    Modified Rankin (Stroke Patients Only)       Balance Overall balance assessment: Needs assistance Sitting-balance support: Feet supported;No upper extremity supported Sitting balance-Leahy Scale: Good     Standing balance support: No upper extremity supported;During functional activity Standing balance-Leahy Scale: Poor Standing balance comment: Pt reaching for UE support during session. Does not feel he needs the RW for support, however would rather use a SPC and hold onto his wife with his other hand. Strongly recommended RW use.                High Level Balance Comments: Pt abandoning RW and reaching for environmental supports during session. In bathroom OT removing RW and asked patient to complete shower transfer. Pt states but I dont have anything to hold onto here . When asked to demo normal home transfer in bathroom pt demonstrates reaching for walls, shower frame, door ways and counter tops.  pt educated on fall risk due to this furniture walking .              Pertinent Vitals/Pain Pain Assessment: Faces Faces Pain Scale: Hurts little more Pain Location: Back Pain Descriptors / Indicators: Operative site  guarding;Aching Pain Intervention(s): Limited activity within patient's tolerance;Monitored during session;Repositioned    Home Living Family/patient expects to be discharged to:: Private residence Living Arrangements: Spouse/significant other Available Help at Discharge: Family;Available 24 hours/day Type of Home: House Home Access: Stairs to enter Entrance Stairs-Rails: Right Entrance Stairs-Number of Steps: 6 - 3 steps x2 Home Layout: Two level;Able to live on main level with bedroom/bathroom Home Equipment: Gilford Rile - 2 wheels;Shower seat;Grab bars - toilet;Grab bars - tub/shower;Hand held shower head      Prior Function Level of Independence: Independent with assistive device(s)   Gait / Transfers Assistance Needed: cane for transfer           Hand Dominance   Dominant Hand: Right    Extremity/Trunk Assessment   Upper Extremity Assessment: Defer to OT evaluation           Lower Extremity Assessment: Generalized weakness      Cervical / Trunk Assessment: Other exceptions (s/p surg)  Communication   Communication: No difficulties  Cognition Arousal/Alertness: Awake/alert Behavior During Therapy: WFL for tasks assessed/performed Overall Cognitive Status: Within Functional Limits for tasks assessed                      General Comments      Exercises        Assessment/Plan    PT Assessment Patient needs continued PT services  PT Diagnosis Difficulty walking;Acute pain   PT Problem List Decreased strength;Decreased range of motion;Decreased activity tolerance;Decreased balance;Decreased mobility;Decreased knowledge of use of DME;Decreased safety awareness;Decreased knowledge of precautions;Pain  PT Treatment Interventions DME instruction;Gait training;Stair training;Functional mobility training;Therapeutic activities;Therapeutic exercise;Neuromuscular re-education;Patient/family education   PT Goals (Current goals can be found in the Care Plan  section) Acute Rehab PT Goals Patient Stated Goal: Home today. Wife's goal is for pt to be safe. PT Goal Formulation: With patient/family Time For Goal Achievement: 01/24/16 Potential to Achieve Goals: Good    Frequency Min 5X/week   Barriers to discharge        Co-evaluation               End of Session   Activity Tolerance: Patient tolerated treatment well Patient left: in bed;with call bell/phone within reach;with family/visitor present Nurse Communication: Mobility status    Functional Assessment Tool Used: Clinical judgement Functional Limitation: Mobility: Walking and moving around Mobility: Walking and Moving Around Current Status (B9390): At least 1 percent but less than 20 percent impaired, limited or restricted Mobility: Walking and Moving Around Goal Status [phone]): At least 1 percent but less than 20 percent impaired, limited or restricted    Time: 0930-0955 PT Time Calculation (min) (ACUTE ONLY): 25 min   Charges:   PT Evaluation $PT Eval Moderate Complexity: 1 Procedure PT Treatments $Gait Training: 8-22 mins   PT G Codes:   PT G-Codes **NOT FOR INPATIENT CLASS** Functional Assessment Tool Used: Clinical judgement Functional Limitation: Mobility: Walking and moving around Mobility: Walking and Moving Around Current Status (Z3007): At least 1 percent but less than 20 percent impaired, limited or restricted Mobility: Walking and Moving Around Goal Status [phone]): At least 1 percent but less than 20 percent impaired, limited or restricted    Rolinda Roan 01/17/2016, 12:09 PM  Rolinda Roan, PT, DPT Acute Rehabilitation Services Pager: [phone]

## 2016-01-19 ENCOUNTER — Ambulatory Visit (INDEPENDENT_AMBULATORY_CARE_PROVIDER_SITE_OTHER): Payer: Medicare Other | Admitting: Cardiology

## 2016-01-19 NOTE — Progress Notes (Signed)
Pt was started on Xarelto '20mg'$ s for Afib on 08/13/13.    Reviewed patients medication list.  Pt is not  currently on any combined P-gp and strong CYP3A4 inhibitors/inducers (ketoconazole, traconazole, ritonavir, carbamazepine, phenytoin, rifampin, St. John's wort).  Reviewed labs.  SCr 1.13, Weight 96.3 Kg, CrCl- 76.83 ml/min.  Dose appropriate based on CrCl.   Hgb and HCT 14.4/42.0.   A full discussion of the nature of anticoagulants has been carried out.  A benefit/risk analysis has been presented to the patient, so that they understand the justification for choosing anticoagulation with Xarelto at this time.  The need for compliance is stressed.  Pt is aware to take the medication once daily with the largest meal of the day.  Side effects of potential bleeding are discussed, including unusual colored urine or stools, coughing up blood or coffee ground emesis, nose bleeds or serious fall or head trauma.  Discussed signs and symptoms of stroke. The patient should avoid any OTC items containing aspirin or ibuprofen.  Avoid alcohol consumption.   Call if any signs of abnormal bleeding.  Discussed financial obligations and resolved any difficulty in obtaining medication.  Next lab test test in 6 months if stable then he can follow up with Cardiologist.

## 2016-01-28 IMAGING — MR MR ABDOMEN WO/W CM
18 series · 48 of 48 positions shown · IV contrast (19ml multihance)
Comparison: 06/10/2014, 09/09/2011 CT

CLINICAL DATA: Abdominal pain, right lower renal pole lesion on
prior exam

EXAM:
MRI ABDOMEN WITHOUT AND WITH CONTRAST
TECHNIQUE: Multiplanar multisequence MR imaging of the abdomen was performed
both before and after the administration of intravenous contrast.
CONTRAST:  19mL MULTIHANCE GADOBENATE DIMEGLUMINE 529 MG/ML IV SOLN

[Series 3: T2 · coronal · 5.0mm · 0.78mm/px · 1 of 19 slices shown (1 of 4)]
[im 1/19]
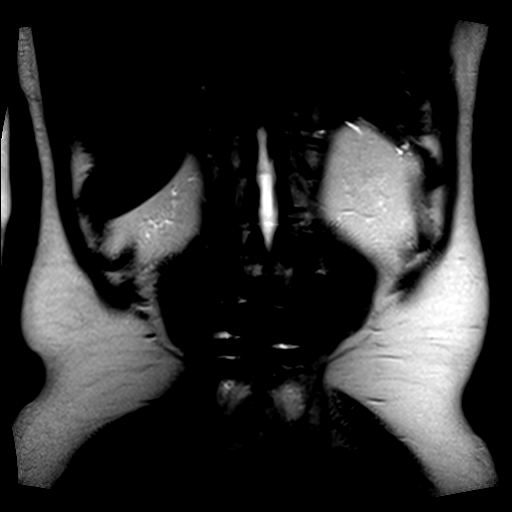

[Series 4: T2 · axial · 5.0mm · 0.66mm/px · 1 of 27 slices shown (2 of 4)]
[im 1/27]
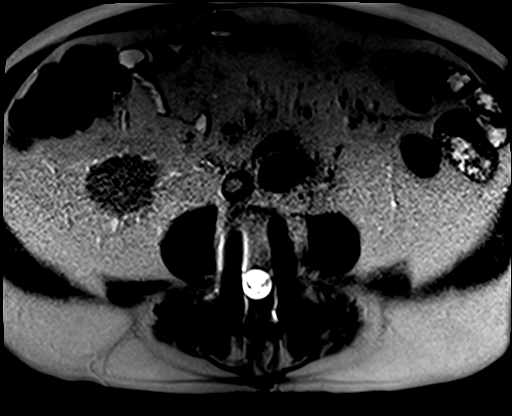

[Series 8: bSSFP · axial · 4.0mm · 0.74mm/px · 1 of 38 slices shown]
[im 1/38]
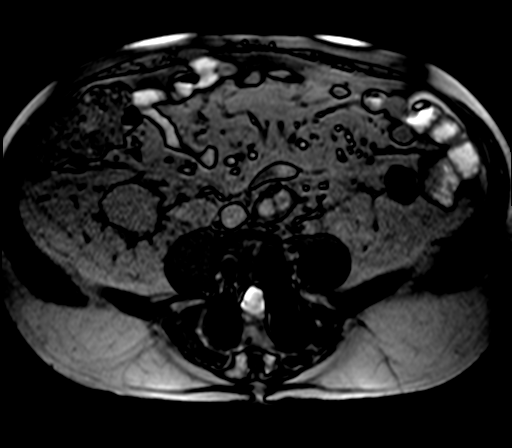

[Series 9: ep2d_diff_b50_500_800_p2_trig · axial · 6.0mm · 1.98mm/px · z∈[-91,+64]mm · 2 of 63 slices shown]
[im 1/63]
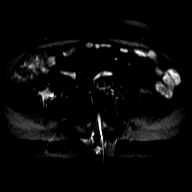
[im 63/63]
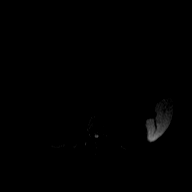

[Series 10: ep2d_diff_b50_500_800_p2_trig_adc · axial · 6.0mm · 1.98mm/px · 1 of 21 slices shown]
[im 1/21]
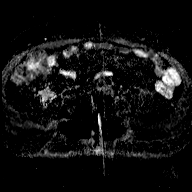

[Series 11: T2 · axial · 5.0mm · 1.33mm/px · 1 of 30 slices shown (3 of 4)]
[im 1/30]
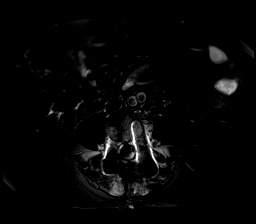

[Series 12: T1 · axial · 5.0mm · 0.66mm/px · z∈[-113,+37]mm · 2 of 52 slices shown]
[im 1/52]
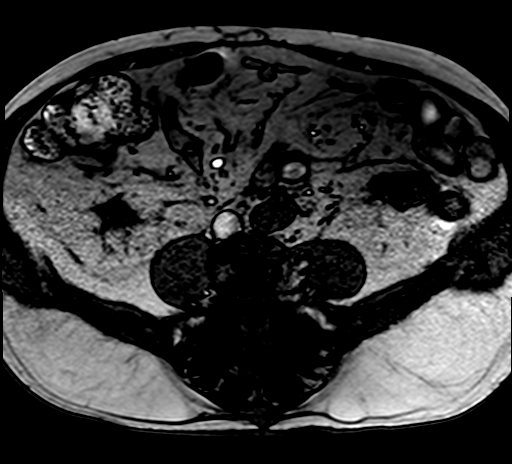
[im 52/52]
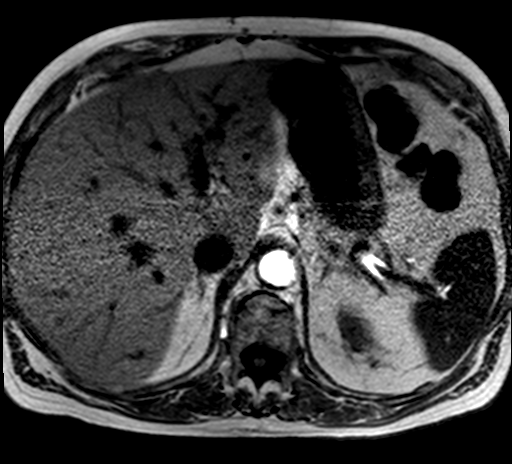

[Series 13: T2 · axial · 5.0mm · 0.66mm/px · 1 of 29 slices shown (4 of 4)]
[im 1/29]
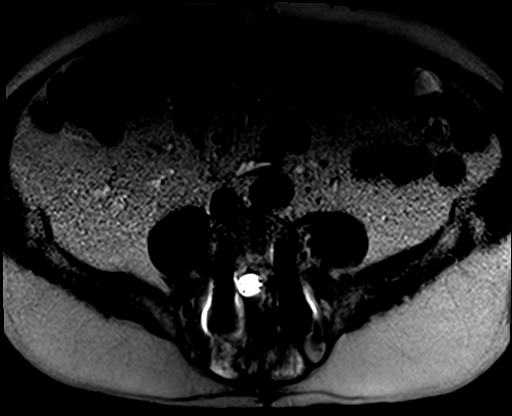

[Series 14: T1 dynamic · axial · non-contrast · 2.3mm · 1.41mm/px · z∈[-117,+46]mm · 3 of 72 slices shown]
[im 1/72]
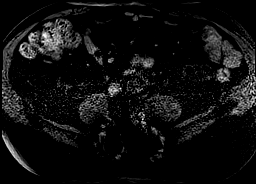
[im 36/72]
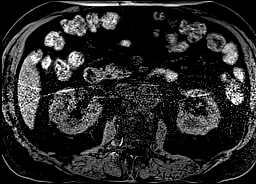
[im 72/72]
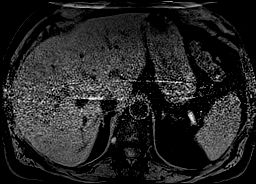

[Series 15: post 25 sec · axial · 2.3mm · 1.41mm/px · z∈[-117,+46]mm · 4 of 72 slices shown]
[im 1/72]
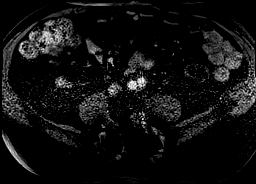
[im 24/72]
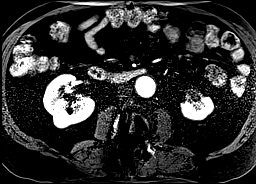
[im 48/72]
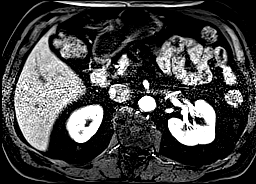
[im 72/72]
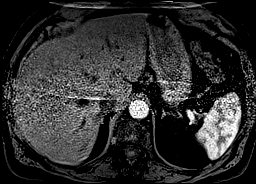

[Series 16: post 25 sec_sub · axial · 2.3mm · 1.41mm/px · z∈[-117,+46]mm · 4 of 72 slices shown]
[im 1/72]
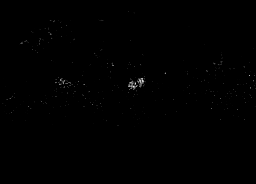
[im 24/72]
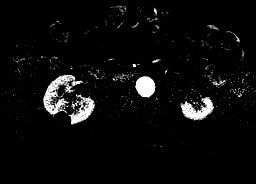
[im 48/72]
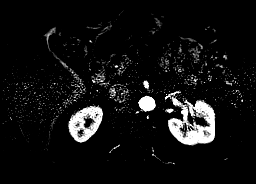
[im 72/72]
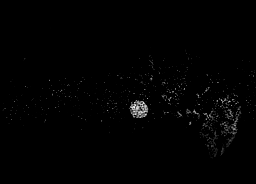

[Series 17: post 45 sec · axial · 2.3mm · 1.41mm/px · z∈[-117,+46]mm · 4 of 72 slices shown]
[im 1/72]
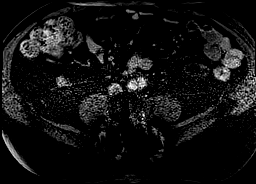
[im 24/72]
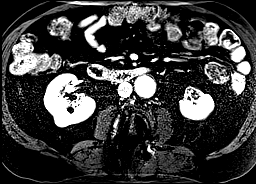
[im 48/72]
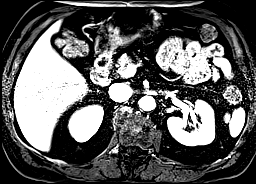
[im 72/72]
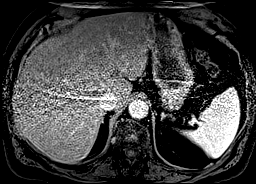

[Series 18: post 45 sec_sub · axial · 2.3mm · 1.41mm/px · z∈[-117,+46]mm · 4 of 72 slices shown]
[im 1/72]
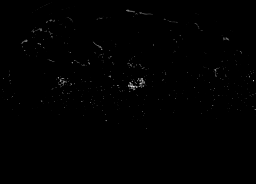
[im 24/72]
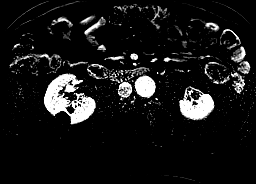
[im 48/72]
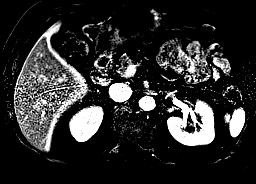
[im 72/72]
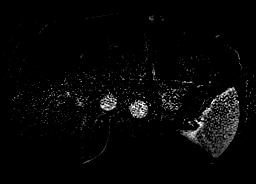

[Series 19: post 90 sec · axial · 2.3mm · 1.41mm/px · z∈[-117,+46]mm · 4 of 72 slices shown]
[im 1/72]
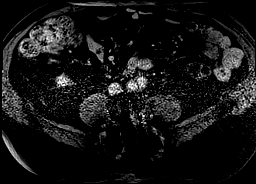
[im 24/72]
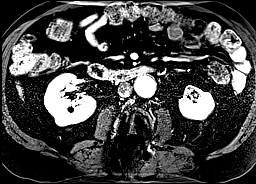
[im 48/72]
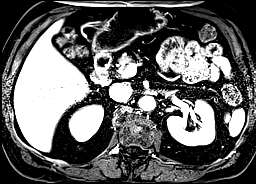
[im 72/72]
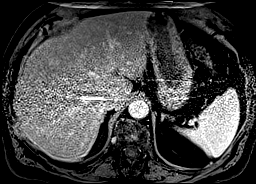

[Series 20: post 90 sec_sub · axial · 2.3mm · 1.41mm/px · z∈[-117,+46]mm · 4 of 72 slices shown]
[im 1/72]
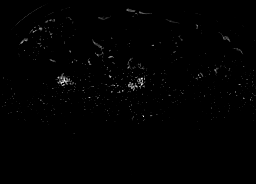
[im 24/72]
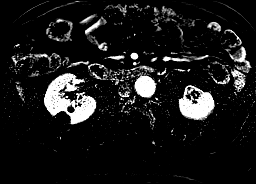
[im 48/72]
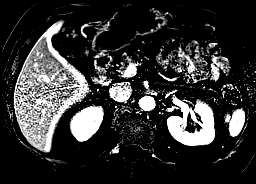
[im 72/72]
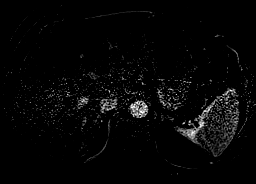

[Series 21: T1 dynamic post-contrast · coronal · 2.6mm · 0.78mm/px · 3 of 52 slices shown]
[im 1/52]
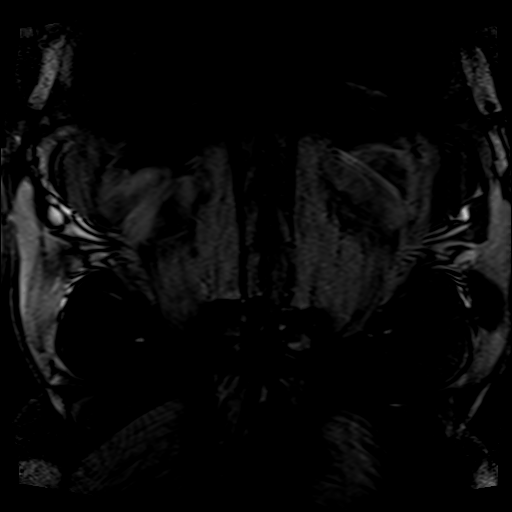
[im 26/52]
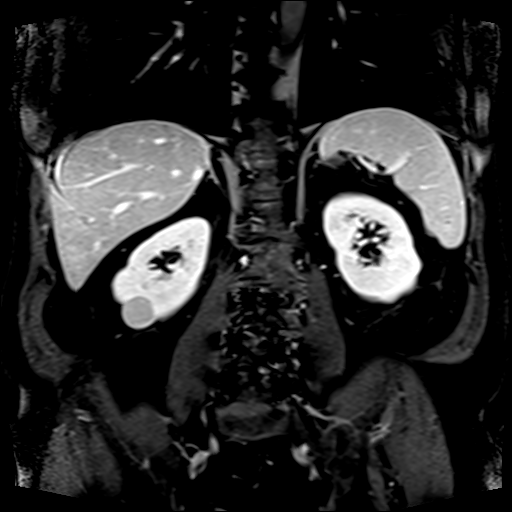
[im 52/52]
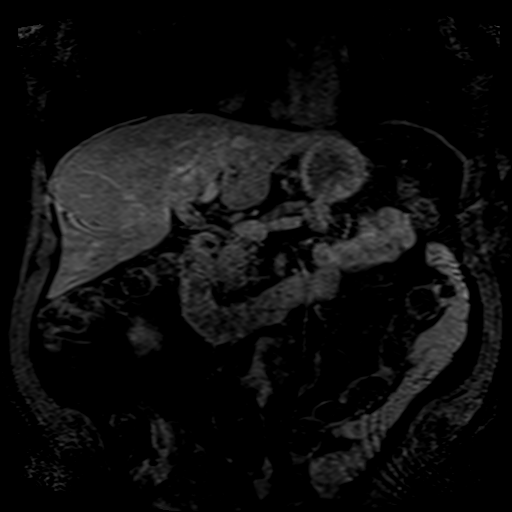

[Series 22: post axial 3+ · axial · 2.3mm · 1.41mm/px · z∈[-117,+46]mm · 4 of 72 slices shown (1 of 2)]
[im 1/72]
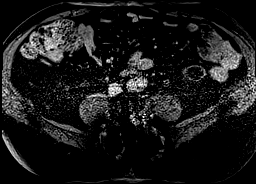
[im 24/72]
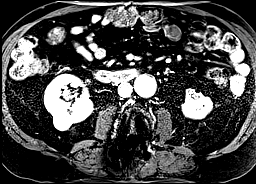
[im 48/72]
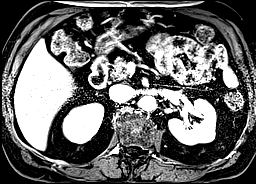
[im 72/72]
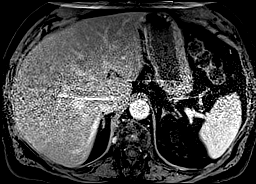

[Series 23: post axial 3+ · axial · 2.3mm · 1.41mm/px · z∈[-117,+46]mm · 4 of 72 slices shown (2 of 2)]
[im 1/72]
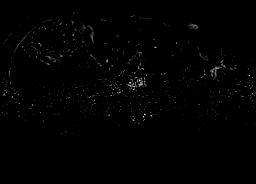
[im 24/72]
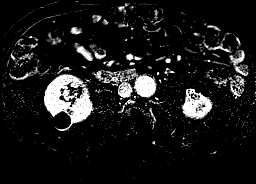
[im 48/72]
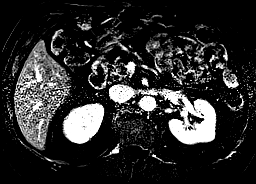
[im 72/72]
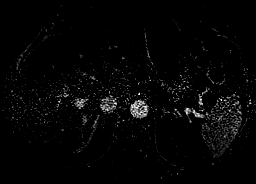

[48 of 48 positions shown; findings below may reference images not displayed]

FINDINGS: Bilateral T2 hyperintense renal cortical cysts are identified. At
the mid aspect of the right kidney is a 2.5 x 2.3 cm T1
hyperintense, T2 hypo intense cortical lesion corresponding to that
seen on the prior exam. After administration of contrast, there is
no appreciable internal enhancement, compatible with a proteinaceous
cyst.

There is a 1 cm T2 hyperintense lesion in the tail of the pancreas
without internal enhancement, in close approximation to the
nondilated pancreatic duct, most likely pancreatic cyst or
intraductal papillary mucinous neoplasm. This was not present on the
6646 prior exam allowing for differences in technique and slice
selection. Adrenal glands, spleen, pancreas, liver are otherwise
unremarkable in their visualized aspects. No hydroureteronephrosis.
Signal artifact from lumbar fusion hardware partly visualized.
IMPRESSION: Benign-appearing proteinaceous cyst, right mid kidney, corresponds
to the finding on the prior exam. No further specific follow up is
needed for this benign finding.

1 cm pancreatic tail cyst which, in the absence of a history of
pancreatitis, most likely represents an intraductal papillary
mucinous neoplasm, for which follow-up abdominal CT in 1 year is
typically recommended for surveillance.

## 2016-02-06 ENCOUNTER — Other Ambulatory Visit: Payer: Self-pay | Admitting: Interventional Cardiology

## 2016-03-21 DIAGNOSIS — M5417 Radiculopathy, lumbosacral region: Secondary | ICD-10-CM | POA: Insufficient documentation

## 2016-04-04 ENCOUNTER — Telehealth: Payer: Self-pay | Admitting: Interventional Cardiology

## 2016-04-04 NOTE — Telephone Encounter (Signed)
FYI fwd to Dr.Smith

## 2016-04-04 NOTE — Telephone Encounter (Signed)
New Message:   Pt is participating in the [place]. He had a weight gain of 6 lbs in one night and no negative symptoms.Any questions please call.

## 2016-04-26 ENCOUNTER — Observation Stay (HOSPITAL_COMMUNITY)
Admission: EM | Admit: 2016-04-26 | Discharge: 2016-04-27 | Disposition: A | Discharge status: Home / Self Care | Payer: Medicare Other | Attending: Internal Medicine | Admitting: Internal Medicine

## 2016-04-26 ENCOUNTER — Emergency Department (HOSPITAL_COMMUNITY): Payer: Medicare Other

## 2016-04-26 ENCOUNTER — Encounter (HOSPITAL_COMMUNITY): Payer: Self-pay | Admitting: Emergency Medicine

## 2016-04-26 DIAGNOSIS — I110 Hypertensive heart disease with heart failure: Secondary | ICD-10-CM | POA: Diagnosis not present

## 2016-04-26 DIAGNOSIS — K219 Gastro-esophageal reflux disease without esophagitis: Secondary | ICD-10-CM | POA: Diagnosis not present

## 2016-04-26 DIAGNOSIS — E785 Hyperlipidemia, unspecified: Secondary | ICD-10-CM | POA: Insufficient documentation

## 2016-04-26 DIAGNOSIS — K529 Noninfective gastroenteritis and colitis, unspecified: Principal | ICD-10-CM | POA: Insufficient documentation

## 2016-04-26 DIAGNOSIS — Z7901 Long term (current) use of anticoagulants: Secondary | ICD-10-CM | POA: Diagnosis not present

## 2016-04-26 DIAGNOSIS — E860 Dehydration: Secondary | ICD-10-CM | POA: Insufficient documentation

## 2016-04-26 DIAGNOSIS — R112 Nausea with vomiting, unspecified: Secondary | ICD-10-CM | POA: Diagnosis not present

## 2016-04-26 DIAGNOSIS — E119 Type 2 diabetes mellitus without complications: Secondary | ICD-10-CM

## 2016-04-26 DIAGNOSIS — A084 Viral intestinal infection, unspecified: Secondary | ICD-10-CM

## 2016-04-26 DIAGNOSIS — Z955 Presence of coronary angioplasty implant and graft: Secondary | ICD-10-CM | POA: Diagnosis not present

## 2016-04-26 DIAGNOSIS — N179 Acute kidney failure, unspecified: Secondary | ICD-10-CM | POA: Insufficient documentation

## 2016-04-26 DIAGNOSIS — Z87891 Personal history of nicotine dependence: Secondary | ICD-10-CM | POA: Insufficient documentation

## 2016-04-26 DIAGNOSIS — I5042 Chronic combined systolic (congestive) and diastolic (congestive) heart failure: Secondary | ICD-10-CM | POA: Diagnosis not present

## 2016-04-26 DIAGNOSIS — I951 Orthostatic hypotension: Secondary | ICD-10-CM | POA: Diagnosis present

## 2016-04-26 DIAGNOSIS — I480 Paroxysmal atrial fibrillation: Secondary | ICD-10-CM | POA: Diagnosis not present

## 2016-04-26 DIAGNOSIS — R269 Unspecified abnormalities of gait and mobility: Secondary | ICD-10-CM | POA: Insufficient documentation

## 2016-04-26 DIAGNOSIS — R197 Diarrhea, unspecified: Secondary | ICD-10-CM

## 2016-04-26 DIAGNOSIS — R262 Difficulty in walking, not elsewhere classified: Secondary | ICD-10-CM | POA: Insufficient documentation

## 2016-04-26 DIAGNOSIS — I2510 Atherosclerotic heart disease of native coronary artery without angina pectoris: Secondary | ICD-10-CM | POA: Insufficient documentation

## 2016-04-26 DIAGNOSIS — D696 Thrombocytopenia, unspecified: Secondary | ICD-10-CM | POA: Diagnosis not present

## 2016-04-26 DIAGNOSIS — I10 Essential (primary) hypertension: Secondary | ICD-10-CM | POA: Diagnosis not present

## 2016-04-26 HISTORY — DX: Obstructive sleep apnea (adult) (pediatric): G47.33

## 2016-04-26 HISTORY — DX: Dependence on other enabling machines and devices: Z99.89

## 2016-04-26 HISTORY — DX: Type 2 diabetes mellitus without complications: E11.9

## 2016-04-26 LAB — CBC WITH DIFFERENTIAL/PLATELET
Basophils Absolute: 0 10*3/uL (ref 0.0–0.1)
Basophils Relative: 0 %
Eosinophils Absolute: 0 10*3/uL (ref 0.0–0.7)
Eosinophils Relative: 0 %
HCT: 43.1 % (ref 39.0–52.0)
Hemoglobin: 14.2 g/dL (ref 13.0–17.0)
Lymphocytes Relative: 5 %
Lymphs Abs: 0.5 10*3/uL — ABNORMAL LOW (ref 0.7–4.0)
MCH: 30.5 pg (ref 26.0–34.0)
MCHC: 32.9 g/dL (ref 30.0–36.0)
MCV: 92.7 fL (ref 78.0–100.0)
Monocytes Absolute: 1.9 10*3/uL — ABNORMAL HIGH (ref 0.1–1.0)
Monocytes Relative: 18 %
Neutro Abs: 8.5 10*3/uL — ABNORMAL HIGH (ref 1.7–7.7)
Neutrophils Relative %: 77 %
Platelets: 121 10*3/uL — ABNORMAL LOW (ref 150–400)
RBC: 4.65 MIL/uL (ref 4.22–5.81)
RDW: 14.2 % (ref 11.5–15.5)
WBC: 11 10*3/uL — ABNORMAL HIGH (ref 4.0–10.5)

## 2016-04-26 LAB — COMPREHENSIVE METABOLIC PANEL
ALT: 26 U/L (ref 17–63)
AST: 25 U/L (ref 15–41)
Albumin: 3.7 g/dL (ref 3.5–5.0)
Alkaline Phosphatase: 55 U/L (ref 38–126)
Anion gap: 13 (ref 5–15)
BUN: 35 mg/dL — ABNORMAL HIGH (ref 6–20)
CO2: 24 mmol/L (ref 22–32)
Calcium: 9.8 mg/dL (ref 8.9–10.3)
Chloride: 102 mmol/L (ref 101–111)
Creatinine, Ser: 1.38 mg/dL — ABNORMAL HIGH (ref 0.61–1.24)
GFR calc Af Amer: 55 mL/min — ABNORMAL LOW (ref >60)
GFR calc non Af Amer: 48 mL/min — ABNORMAL LOW (ref >60)
Glucose, Bld: 222 mg/dL — ABNORMAL HIGH (ref 65–99)
Potassium: 3.8 mmol/L (ref 3.5–5.1)
Sodium: 139 mmol/L (ref 135–145)
Total Bilirubin: 0.6 mg/dL (ref 0.3–1.2)
Total Protein: 6.6 g/dL (ref 6.5–8.1)

## 2016-04-26 LAB — URINALYSIS, ROUTINE W REFLEX MICROSCOPIC
Bilirubin Urine: NEGATIVE
Glucose, UA: >1000 mg/dL — AB
Ketones, ur: 15 mg/dL — AB
Leukocytes, UA: NEGATIVE
Nitrite: NEGATIVE
Protein, ur: NEGATIVE mg/dL
Specific Gravity, Urine: 1.031 — ABNORMAL HIGH (ref 1.005–1.030)
pH: 5.5 (ref 5.0–8.0)

## 2016-04-26 LAB — I-STAT TROPONIN, ED: Troponin i, poc: 0 ng/mL (ref 0.00–0.08)

## 2016-04-26 LAB — GLUCOSE, CAPILLARY
Glucose-Capillary: 267 mg/dL — ABNORMAL HIGH (ref 65–99)
Glucose-Capillary: 179 mg/dL — ABNORMAL HIGH (ref 65–99)

## 2016-04-26 LAB — URINE MICROSCOPIC-ADD ON

## 2016-04-26 LAB — POC OCCULT BLOOD, ED: Fecal Occult Bld: NEGATIVE

## 2016-04-26 LAB — C DIFFICILE QUICK SCREEN W PCR REFLEX
C Diff antigen: NEGATIVE
C Diff interpretation: NEGATIVE
C Diff toxin: NEGATIVE

## 2016-04-26 LAB — LIPASE, BLOOD: Lipase: 25 U/L (ref 11–51)

## 2016-04-26 MED ORDER — METHOCARBAMOL 500 MG PO TABS: 500.0000 mg | ORAL_TABLET | Freq: Four times a day (QID) | ORAL | Status: DC | PRN

## 2016-04-26 MED ORDER — ATORVASTATIN CALCIUM 10 MG PO TABS
10.0000 mg | ORAL_TABLET | Freq: Every day | ORAL | Status: DC
  Administered 2016-04-26 – 2016-04-27 (×2): 10 mg via ORAL
  Filled 2016-04-26 (×2): qty 1

## 2016-04-26 MED ORDER — ACETAMINOPHEN 325 MG PO TABS
650.0000 mg | ORAL_TABLET | Freq: Four times a day (QID) | ORAL | Status: DC | PRN
Start: 2016-04-26 — End: 2016-04-27

## 2016-04-26 MED ORDER — SODIUM CHLORIDE 0.9 % IV BOLUS (SEPSIS)
1000.0000 mL | Freq: Once | INTRAVENOUS | Status: AC
  Administered 2016-04-26: 1000 mL via INTRAVENOUS

## 2016-04-26 MED ORDER — SODIUM CHLORIDE 0.9 % IV SOLN: INTRAVENOUS | Status: AC

## 2016-04-26 MED ORDER — ACETAMINOPHEN 650 MG RE SUPP
650.0000 mg | Freq: Four times a day (QID) | RECTAL | Status: DC | PRN
Start: 2016-04-26 — End: 2016-04-27

## 2016-04-26 MED ORDER — VITAMIN B-12 1000 MCG PO TABS
5000.0000 ug | ORAL_TABLET | Freq: Every day | ORAL | Status: DC
  Administered 2016-04-27: 5000 ug via ORAL
  Filled 2016-04-26: qty 5

## 2016-04-26 MED ORDER — PANTOPRAZOLE SODIUM 40 MG PO TBEC
40.0000 mg | DELAYED_RELEASE_TABLET | Freq: Every day | ORAL | Status: DC
  Administered 2016-04-26 – 2016-04-27 (×2): 40 mg via ORAL
  Filled 2016-04-26 (×2): qty 1

## 2016-04-26 MED ORDER — SODIUM CHLORIDE 0.9 % IV SOLN: INTRAVENOUS | Status: DC

## 2016-04-26 MED ORDER — GLIMEPIRIDE 2 MG PO TABS
2.0000 mg | ORAL_TABLET | Freq: Every day | ORAL | Status: DC
  Administered 2016-04-26: 2 mg via ORAL
  Filled 2016-04-26 (×2): qty 1

## 2016-04-26 MED ORDER — SODIUM CHLORIDE 0.9 % IV SOLN
INTRAVENOUS | Status: DC
  Administered 2016-04-26: 75 mL via INTRAVENOUS
  Administered 2016-04-27: 1000 mL via INTRAVENOUS

## 2016-04-26 MED ORDER — RIVAROXABAN 20 MG PO TABS
20.0000 mg | ORAL_TABLET | Freq: Every day | ORAL | Status: DC
Start: 2016-04-26 — End: 2016-04-27
  Administered 2016-04-26: 20 mg via ORAL
  Filled 2016-04-26: qty 1

## 2016-04-26 MED ORDER — ONDANSETRON HCL 4 MG/2ML IJ SOLN: 4.0000 mg | Freq: Four times a day (QID) | INTRAMUSCULAR | Status: DC | PRN

## 2016-04-26 MED ORDER — METOPROLOL TARTRATE 50 MG PO TABS
50.0000 mg | ORAL_TABLET | Freq: Two times a day (BID) | ORAL | Status: DC
  Filled 2016-04-26 (×2): qty 1

## 2016-04-26 MED ORDER — CO Q 10 100 MG PO CAPS: 100.0000 mg | ORAL_CAPSULE | Freq: Every day | ORAL | Status: DC

## 2016-04-26 MED ORDER — HYDROCODONE-ACETAMINOPHEN 5-325 MG PO TABS: 1.0000 | ORAL_TABLET | ORAL | Status: DC | PRN

## 2016-04-26 MED ORDER — AMIODARONE HCL 200 MG PO TABS
200.0000 mg | ORAL_TABLET | Freq: Every day | ORAL | Status: DC
  Administered 2016-04-26 – 2016-04-27 (×2): 200 mg via ORAL
  Filled 2016-04-26 (×2): qty 1

## 2016-04-26 MED ORDER — DIAZEPAM 5 MG PO TABS
5.0000 mg | ORAL_TABLET | Freq: Four times a day (QID) | ORAL | Status: DC | PRN
Start: 2016-04-26 — End: 2016-04-27

## 2016-04-26 MED ORDER — ADULT MULTIVITAMIN W/MINERALS CH
1.0000 | ORAL_TABLET | Freq: Every day | ORAL | Status: DC
  Administered 2016-04-26 – 2016-04-27 (×2): 1 via ORAL
  Filled 2016-04-26 (×2): qty 1

## 2016-04-26 MED ORDER — INSULIN ASPART 100 UNIT/ML ~~LOC~~ SOLN
0.0000 [IU] | Freq: Three times a day (TID) | SUBCUTANEOUS | Status: DC
  Administered 2016-04-26: 5 [IU] via SUBCUTANEOUS
  Administered 2016-04-27: 2 [IU] via SUBCUTANEOUS
  Administered 2016-04-27: 5 [IU] via SUBCUTANEOUS
  Administered 2016-04-27: 3 [IU] via SUBCUTANEOUS

## 2016-04-26 MED ORDER — ONDANSETRON HCL 4 MG PO TABS: 4.0000 mg | ORAL_TABLET | Freq: Four times a day (QID) | ORAL | Status: DC | PRN

## 2016-04-26 MED ORDER — VITAMIN B-6 100 MG PO TABS
100.0000 mg | ORAL_TABLET | Freq: Every day | ORAL | Status: DC
  Administered 2016-04-26 – 2016-04-27 (×2): 100 mg via ORAL
  Filled 2016-04-26 (×2): qty 1

## 2016-04-26 NOTE — ED Provider Notes (Signed)
CSN: [id number]     Arrival date & time 04/26/16  6967 History   First MD Initiated Contact with Patient 04/26/16 0701     Chief Complaint  Patient presents with  . Nausea  . Emesis  . Diarrhea     (Consider location/radiation/quality/duration/timing/severity/associated sxs/prior Treatment) HPI Complains of vomiting and diarrhea onset 5 AM today upon awakening. He felt well when he went to bed last night other associated symptoms include generalized weakness. He is uncertain if he vomited blood or had blood in stool. No other associated symptoms no treatment prior to coming here. No fever. Presently feels generally weak. Otherwise asymptomatic. Denies abdominal pain. No other associated symptoms. Nothing makes symptoms better or worse. Past Medical History  Diagnosis Date  . Hypercholesteremia     takes lipitor daily  . AAA (abdominal aortic aneurysm) ([place])   . Pain     back pain chronic- seen at pain clinic  . 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 Bronchitis     hx of  . Ileus ([place])     After AAA  . Arthritis     BacK  . Incisional hernia     "small, from AAA"  . Coronary atherosclerosis of native coronary artery     ejection fraction of 40%, prior inferior myocardial infarction in February 1993, PTCA, LAD rotational atherectomy in August of 1998, acute inferolateral myocardial infarction, ventricular fibrillation in July of 1999, PTCA and stent of obtuse marginal 1, 100% RCA 1/12 with collaterals from patent circumflex and LAD  . Hypertension     takes Ramipril daily  . MI (myocardial infarction) ([place])     1992 and 1997 sees Dr. Linard Millers as needed   . Sleep apnea     uses CPAP--sleep study done at least 85yr ago  . Chronic back pain   . Dry skin   . Acid reflux     takes Prilosec and Protonix daily  . Complication of anesthesia     -years ago hair fell out.;ileus after 2 of his surgeries  . Plantar fasciitis     bilatetral  . Diabetes mellitus     DM II  . Foot drop,  bilateral   . Dysrhythmia     atrial fibrillation   Past Surgical History  Procedure Laterality Date  . Abdominal surgery    . Abdominal aortic aneurysm repair    . Colonoscopy    . Cholecystectomy    . Back surgery  2013    Miminal Invasive x 3in [place].  . Cardiac catheterization  2009/2012  . Coronary angioplasty      2 stents  . Coronary stent placement    . Cardioversion N/A 04/12/2015    Procedure: CARDIOVERSION;  Surgeon: TSueanne Margarita MD;  [facility];  Service: Cardiovascular;  Laterality: N/A;  . Lumbar laminectomy/decompression microdiscectomy Right 01/16/2016    Procedure: Right Lumbar five-Sacral one Laminectomy;  Surgeon: HKristeen Miss MD;  [facility];  Service: Neurosurgery;  Laterality: Right;  Right L5-S1 Laminectomy   Family History  Problem Relation Age of Onset  . Diabetes Mother   . Coronary artery disease Mother   . Diabetes Father   . Arthritis Father   . Rheum arthritis Father   . Coronary artery disease Father    Social History  Substance Use Topics  . Smoking status: Former Smoker    Quit date: 01/01/1992  . Smokeless tobacco: Never Used  . Alcohol Use: Yes  Comment: rarely    Review of Systems  Gastrointestinal: Positive for vomiting and diarrhea.  Allergic/Immunologic: Positive for immunocompromised state.       Diabetic  Neurological: Positive for weakness.  All other systems reviewed and are negative.     Allergies  Cymbalta and Lyrica  Home Medications   Prior to Admission medications   Medication Sig Start Date End Date Taking? Authorizing Provider  amiodarone (PACERONE) 200 MG tablet Take 1 tablet (200 mg total) by mouth daily. 07/04/15   Belva Crome, MD  atorvastatin (LIPITOR) 10 MG tablet Take 10 mg by mouth daily.    Historical Provider, MD  Coenzyme Q10 (CO Q 10) 100 MG CAPS Take 100 mg by mouth daily.    Historical Provider, MD  diazepam (VALIUM) 5 MG tablet Take 5 mg by mouth every 6 (six)  hours as needed. (spasms) 10/31/15   Historical Provider, MD  empagliflozin (JARDIANCE) 10 MG TABS tablet Take 10 mg by mouth daily.    Historical Provider, MD  glimepiride (AMARYL) 2 MG tablet Take 2 mg by mouth daily. 01/10/15   Historical Provider, MD  HYDROcodone-acetaminophen (NORCO) 5-325 MG tablet Take 1 - 2 tablets by mouth every 6 hours as needed for pain. 10/07/15   Historical Provider, MD  HYDROcodone-acetaminophen (NORCO/VICODIN) 5-325 MG tablet Take 1-2 tablets by mouth every 4 (four) hours as needed for moderate pain. 01/17/16   Kristeen Miss, MD  metFORMIN (GLUCOPHAGE-XR) 500 MG 24 hr tablet Take 500-1,000 mg by mouth 2 (two) times daily. '500mg'$  in the morning, '1000mg'$  in the evening 10/12/15   Historical Provider, MD  methocarbamol (ROBAXIN) 500 MG tablet Take 1 tablet (500 mg total) by mouth every 6 (six) hours as needed for muscle spasms. 01/17/16   Kristeen Miss, MD  metoprolol (LOPRESSOR) 50 MG tablet TAKE ONE TABLET BY MOUTH TWICE DAILY 02/06/16   Belva Crome, MD  Multiple Vitamin (MULTIVITAMIN WITH MINERALS) TABS Take 1 tablet by mouth daily.    Historical Provider, MD  omeprazole (PRILOSEC) 20 MG capsule Take 20 mg by mouth daily. 09/14/12   Historical Provider, MD  ondansetron (ZOFRAN) 4 MG tablet Take 4 mg by mouth 3 (three) times daily as needed. (nausea) 10/07/15   Historical Provider, MD  pyridOXINE (VITAMIN B-6) 100 MG tablet Take 100 mg by mouth daily.    Historical Provider, MD  ramipril (ALTACE) 10 MG capsule Take 10 mg by mouth daily. 11/21/14   Historical Provider, MD  rivaroxaban (XARELTO) 20 MG TABS tablet Take 1 tablet (20 mg total) by mouth daily with supper. 08/14/15   Belva Crome, MD  vitamin B-12 (CYANOCOBALAMIN) 1000 MCG tablet Take 5,000 mcg by mouth daily.     Historical Provider, MD   BP 115/53 mmHg  Pulse 83  Temp(Src) 97.6 F (36.4 C) (Oral)  Resp 17  SpO2 93% Physical Exam  Constitutional: He appears well-developed and well-nourished.  HENT:  Head:  Normocephalic and atraumatic.  Eyes: Conjunctivae are normal. Pupils are equal, round, and reactive to light.  Neck: Neck supple. No tracheal deviation present. No thyromegaly present.  Cardiovascular: Normal rate and regular rhythm.   No murmur heard. Pulmonary/Chest: Effort normal and breath sounds normal.  Abdominal: Soft. Bowel sounds are normal. He exhibits no distension. There is no tenderness.  Midline surgical scar  Genitourinary: Guaiac negative stool.  Normal tone brown stool Hemoccult negative  Musculoskeletal: Normal range of motion. He exhibits no edema or tenderness.  Neurological: He is alert. Coordination normal.  Skin: Skin is warm and dry. No rash noted.  Psychiatric: He has a normal mood and affect.  Nursing note and vitals reviewed.   ED Course  Procedures (including critical care time) Labs Review Labs Reviewed - No data to display  Imaging Review No results found. I have personally reviewed and evaluated these images and lab results as part of my medical decision-making.   EKG Interpretation   Date/Time:  Friday April 26 2016 08:21:41 EDT Ventricular Rate:  94 PR Interval:    QRS Duration: 110 QT Interval:  401 QTC Calculation: 502 R Axis:   -71 Text Interpretation:  Sinus rhythm Paired ventricular premature complexes  RSR' in V1 or V2, right VCD or RVH Inferior infarct, old Abnormal lateral  Q waves Prolonged QT interval Premature ventricular complexes New since  previous tracing Confirmed by Selassie Spatafore  MD, Delila Kuklinski [phone]) on 04/26/2016  8:25:16 AM     After treatment with IV hydration. Patient ate a meal. He is mildly lightheaded on standing. He is able to walk. Feels improved. Results for orders placed or performed during the hospital encounter of 04/26/16  Comprehensive metabolic panel  Result Value Ref Range   Sodium 139 135 - 145 mmol/L   Potassium 3.8 3.5 - 5.1 mmol/L   Chloride 102 101 - 111 mmol/L   CO2 24 22 - 32 mmol/L   Glucose, Bld 222  (H) 65 - 99 mg/dL   BUN 35 (H) 6 - 20 mg/dL   Creatinine, Ser 1.38 (H) 0.61 - 1.24 mg/dL   Calcium 9.8 8.9 - 10.3 mg/dL   Total Protein 6.6 6.5 - 8.1 g/dL   Albumin 3.7 3.5 - 5.0 g/dL   AST 25 15 - 41 U/L   ALT 26 17 - 63 U/L   Alkaline Phosphatase 55 38 - 126 U/L   Total Bilirubin 0.6 0.3 - 1.2 mg/dL   GFR calc non Af Amer 48 (L) >60 mL/min   GFR calc Af Amer 55 (L) >60 mL/min   Anion gap 13 5 - 15  CBC with Differential/Platelet  Result Value Ref Range   WBC 11.0 (H) 4.0 - 10.5 K/uL   RBC 4.65 4.22 - 5.81 MIL/uL   Hemoglobin 14.2 13.0 - 17.0 g/dL   HCT 43.1 39.0 - 52.0 %   MCV 92.7 78.0 - 100.0 fL   MCH 30.5 26.0 - 34.0 pg   MCHC 32.9 30.0 - 36.0 g/dL   RDW 14.2 11.5 - 15.5 %   Platelets 121 (L) 150 - 400 K/uL   Neutrophils Relative % 77 %   Neutro Abs 8.5 (H) 1.7 - 7.7 K/uL   Lymphocytes Relative 5 %   Lymphs Abs 0.5 (L) 0.7 - 4.0 K/uL   Monocytes Relative 18 %   Monocytes Absolute 1.9 (H) 0.1 - 1.0 K/uL   Eosinophils Relative 0 %   Eosinophils Absolute 0.0 0.0 - 0.7 K/uL   Basophils Relative 0 %   Basophils Absolute 0.0 0.0 - 0.1 K/uL  Lipase, blood  Result Value Ref Range   Lipase 25 11 - 51 U/L  Urinalysis, Routine w reflex microscopic (not at [facility])  Result Value Ref Range   Color, Urine YELLOW YELLOW   APPearance CLEAR CLEAR   Specific Gravity, Urine 1.031 (H) 1.005 - 1.030   pH 5.5 5.0 - 8.0   Glucose, UA >1000 (A) NEGATIVE mg/dL   Hgb urine dipstick SMALL (A) NEGATIVE   Bilirubin Urine NEGATIVE NEGATIVE   Ketones, ur 15 (A) NEGATIVE  mg/dL   Protein, ur NEGATIVE NEGATIVE mg/dL   Nitrite NEGATIVE NEGATIVE   Leukocytes, UA NEGATIVE NEGATIVE  Urine microscopic-add on  Result Value Ref Range   Squamous Epithelial / LPF 0-5 (A) NONE SEEN   WBC, UA 0-5 0 - 5 WBC/hpf   RBC / HPF 0-5 0 - 5 RBC/hpf   Bacteria, UA RARE (A) NONE SEEN  I-stat troponin, ED  Result Value Ref Range   Troponin i, poc 0.00 0.00 - 0.08 ng/mL   Comment 3          POC occult blood, ED  Provider will collect  Result Value Ref Range   Fecal Occult Bld NEGATIVE NEGATIVE   Dg Abd Acute W/chest  04/26/2016  CLINICAL DATA:  Vomiting, diarrhea. EXAM: DG ABDOMEN ACUTE W/ 1V CHEST COMPARISON:  None. FINDINGS: There is no evidence of dilated bowel loops or free intraperitoneal air. Status post cholecystectomy. Status post surgical fusion of lower lumbar spine. Left renal calculus is noted. Heart size and mediastinal contours are within normal limits. Atherosclerosis of abdominal aorta is noted. Both lungs are clear. IMPRESSION: Left renal calculus. No evidence of bowel obstruction or ileus. Aortic atherosclerosis. No acute cardiopulmonary disease. Electronically Signed   By: Marijo Conception, M.D.   On: 04/26/2016 08:05    MDM  I discussed 23 hour observation with patient first going home.[name] He prefers 23 hour observation as his wife feels that he is a fall risk. Dr.Elgergawy consulted and will evaluate patient in the ED.Dr.Elgergawy valuated patient in the ED and arrange for overnight stay Final diagnoses:  None  Diagnosis #1 nausea vomiting and diarrhea #2 dehydration #3 renal insufficiency #4 hyperglycemia      Orlie Dakin, MD 04/26/16 1409

## 2016-04-26 NOTE — ED Notes (Signed)
Attempt to call report to 5W

## 2016-04-26 NOTE — ED Notes (Signed)
Dr. Lenna Sciara aware of patient's elevated heart rate.

## 2016-04-26 NOTE — ED Notes (Signed)
Previous shift RN attempted to call report to 5W, unable to take.  Will attempt to call report

## 2016-04-26 NOTE — ED Notes (Signed)
Pt requesting imodium; RN notified

## 2016-04-26 NOTE — ED Notes (Signed)
Per [facility]  N/V/D onset of 0500. No V/D with EMS. Pt felt fine within the last few days. Pt wife had the same meal, pt wife is not symptomatic.

## 2016-04-26 NOTE — ED Notes (Signed)
Took pt to restroom via wheelchair

## 2016-04-26 NOTE — H&P (Signed)
TRH H&P   Patient Demographics:    [name], is a 75 y.o. male  MRN: [id number]   DOB - 14-Dec-1940  Admit Date - 04/26/2016  Outpatient Primary MD for the patient is Irven Shelling, MD  Referring MD/NP/PA: Dr Cathleen Fears  Patient coming from: Home  Chief Complaint  Patient presents with  . Nausea  . Emesis  . Diarrhea      HPI:    [name]  is a 75 y.o. male, with history of chronic atrial fibrillation, CAD, hypertension, diabetes mellitus type 2, OSA , presents with complaints of nausea vomiting and diarrhea this a.m., reportedly woke up at 5:00 with generalized weakness, strong nausea, had profound diarrhea, since then has been feeling generally weak, unable to stand up, reports one episode of vomiting as well, denies any coffee-ground emesis, no melena, no bright red blood per rectum, patient denies any sick contacts, cough, fever or chills or abdominal pain. In ED: Patient is afebrile, mild leukocytosis at 11k, with AKI of 1.38, patient received , 1 L of IV normal saline in NAD, despite that remained lightheaded, was orthostatic, sitting 134/106, standing 99/64, hospitalist requested to admit    Review of systems:    In addition to the HPI above, No Fever-chills, No Headache, No changes with Vision or hearing, No problems swallowing food or Liquids, No Chest pain, Cough or Shortness of Breath, No Abdominal pain, Complains of nausea vomiting and diarrhea No Blood in stool or Urine, No dysuria, No new skin rashes or bruises, No new joints pains-aches,  No new weakness, tingling, numbness in any extremity, reports generalized weakness No recent weight gain or loss, No polyuria, polydypsia or polyphagia, No significant Mental Stressors.  A full 10 point Review of Systems was done, except as stated above, all other Review of Systems were negative.   With Past  History of the following :    Past Medical History  Diagnosis Date  . Hypercholesteremia     takes lipitor daily  . AAA (abdominal aortic aneurysm) ([place])   . Pain     back pain chronic- seen at pain clinic  . 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 Bronchitis     hx of  . Ileus ([place])     After AAA  . Arthritis     BacK  . Incisional hernia     "small, from AAA"  . Coronary atherosclerosis of native coronary artery     ejection fraction of 40%, prior inferior myocardial infarction in February 1993, PTCA, LAD rotational atherectomy in August of 1998, acute inferolateral myocardial infarction, ventricular fibrillation in July of 1999, PTCA and stent of obtuse marginal 1, 100% RCA 1/12 with collaterals from patent circumflex and LAD  . Hypertension     takes Ramipril daily  . MI (myocardial infarction) ([place])     1992 and 1997 sees Dr.  Linard Millers as needed   . Sleep apnea     uses CPAP--sleep study done at least 77yr ago  . Chronic back pain   . Dry skin   . Acid reflux     takes Prilosec and Protonix daily  . Complication of anesthesia     -years ago hair fell out.;ileus after 2 of his surgeries  . Plantar fasciitis     bilatetral  . Diabetes mellitus     DM II  . Foot drop, bilateral   . Dysrhythmia     atrial fibrillation      Past Surgical History  Procedure Laterality Date  . Abdominal surgery    . Abdominal aortic aneurysm repair    . Colonoscopy    . Cholecystectomy    . Back surgery  2013    Miminal Invasive x 3in [place].  . Cardiac catheterization  2009/2012  . Coronary angioplasty      2 stents  . Coronary stent placement    . Cardioversion N/A 04/12/2015    Procedure: CARDIOVERSION;  Surgeon: TSueanne Margarita MD;  [facility];  Service: Cardiovascular;  Laterality: N/A;  . Lumbar laminectomy/decompression microdiscectomy Right 01/16/2016    Procedure: Right Lumbar five-Sacral one Laminectomy;  Surgeon: HKristeen Miss MD;  [facility];   Service: Neurosurgery;  Laterality: Right;  Right L5-S1 Laminectomy      Social History:     Social History  Substance Use Topics  . Smoking status: Former Smoker    Quit date: 01/01/1992  . Smokeless tobacco: Never Used  . Alcohol Use: Yes     Comment: rarely     Lives - Home  Mobility - ambulates with cane     Family History :     Family History  Problem Relation Age of Onset  . Diabetes Mother   . Coronary artery disease Mother   . Diabetes Father   . Arthritis Father   . Rheum arthritis Father   . Coronary artery disease Father       Home Medications:   Prior to Admission medications   Medication Sig Start Date End Date Taking? Authorizing Provider  amiodarone (PACERONE) 200 MG tablet Take 1 tablet (200 mg total) by mouth daily. 07/04/15   HBelva Crome MD  atorvastatin (LIPITOR) 10 MG tablet Take 10 mg by mouth daily.    Historical Provider, MD  Coenzyme Q10 (CO Q 10) 100 MG CAPS Take 100 mg by mouth daily.    Historical Provider, MD  diazepam (VALIUM) 5 MG tablet Take 5 mg by mouth every 6 (six) hours as needed. (spasms) 10/31/15   Historical Provider, MD  empagliflozin (JARDIANCE) 10 MG TABS tablet Take 10 mg by mouth daily.    Historical Provider, MD  glimepiride (AMARYL) 2 MG tablet Take 2 mg by mouth daily. 01/10/15   Historical Provider, MD  HYDROcodone-acetaminophen (NORCO) 5-325 MG tablet Take 1 - 2 tablets by mouth every 6 hours as needed for pain. 10/07/15   Historical Provider, MD  HYDROcodone-acetaminophen (NORCO/VICODIN) 5-325 MG tablet Take 1-2 tablets by mouth every 4 (four) hours as needed for moderate pain. 01/17/16   HKristeen Miss MD  metFORMIN (GLUCOPHAGE-XR) 500 MG 24 hr tablet Take 500-1,000 mg by mouth 2 (two) times daily. '500mg'$  in the morning, '1000mg'$  in the evening 10/12/15   Historical Provider, MD  methocarbamol (ROBAXIN) 500 MG tablet Take 1 tablet (500 mg total) by mouth every 6 (six) hours as needed for muscle spasms.  01/17/16   Kristeen Miss, MD  metoprolol (LOPRESSOR) 50 MG tablet TAKE ONE TABLET BY MOUTH TWICE DAILY 02/06/16   Belva Crome, MD  Multiple Vitamin (MULTIVITAMIN WITH MINERALS) TABS Take 1 tablet by mouth daily.    Historical Provider, MD  omeprazole (PRILOSEC) 20 MG capsule Take 20 mg by mouth daily. 09/14/12   Historical Provider, MD  ondansetron (ZOFRAN) 4 MG tablet Take 4 mg by mouth 3 (three) times daily as needed. (nausea) 10/07/15   Historical Provider, MD  pyridOXINE (VITAMIN B-6) 100 MG tablet Take 100 mg by mouth daily.    Historical Provider, MD  ramipril (ALTACE) 10 MG capsule Take 10 mg by mouth daily. 11/21/14   Historical Provider, MD  rivaroxaban (XARELTO) 20 MG TABS tablet Take 1 tablet (20 mg total) by mouth daily with supper. 08/14/15   Belva Crome, MD  vitamin B-12 (CYANOCOBALAMIN) 1000 MCG tablet Take 5,000 mcg by mouth daily.     Historical Provider, MD     Allergies:     Allergies  Allergen Reactions  . Cymbalta [Duloxetine Hcl] Nausea And Vomiting and Other (See Comments)    Rapid drop in blood pressure  . Lyrica [Pregabalin] Other (See Comments)    Makes me goofy, keeps me off balance, clouds my thinking     Physical Exam:   Vitals  Blood pressure 107/53, pulse 92, temperature 98.1 F (36.7 C), temperature source Rectal, resp. rate 15, SpO2 97 %.   1. General Frail elderly malein bed in NAD,    2. Normal affect and insight, Not Suicidal or Homicidal, Awake Alert, Oriented X 3.  3. No F.N deficits, ALL C.Nerves Intact, Strength 5/5 all 4 extremities, Sensation intact all 4 extremities, Plantars down going.  4. Ears and Eyes appear Normal, Conjunctivae clear, PERRLA. Moist Oral Mucosa.  5. Supple Neck, No JVD, No cervical lymphadenopathy appriciated, No Carotid Bruits.  6. Symmetrical Chest wall movement, Good air movement bilaterally, CTAB.  7. RRR, No Gallops, Rubs or Murmurs, No Parasternal Heave.  8. Positive Bowel Sounds, Abdomen Soft, No tenderness, No  organomegaly appriciated,No rebound -guarding or rigidity.  9.  No Cyanosis, Normal Skin Turgor, No Skin Rash or Bruise.  10. Good muscle tone,  joints appear normal , no effusions, Normal ROM.  11. No Palpable Lymph Nodes in Neck or Axillae    Data Review:    CBC  Recent Labs Lab 04/26/16 0721  WBC 11.0*  HGB 14.2  HCT 43.1  PLT 121*  MCV 92.7  MCH 30.5  MCHC 32.9  RDW 14.2  LYMPHSABS 0.5*  MONOABS 1.9*  EOSABS 0.0  BASOSABS 0.0   ------------------------------------------------------------------------------------------------------------------  Chemistries   Recent Labs Lab 04/26/16 0721  NA 139  K 3.8  CL 102  CO2 24  GLUCOSE 222*  BUN 35*  CREATININE 1.38*  CALCIUM 9.8  AST 25  ALT 26  ALKPHOS 55  BILITOT 0.6   ------------------------------------------------------------------------------------------------------------------ CrCl cannot be calculated (Unknown ideal weight.). ------------------------------------------------------------------------------------------------------------------ No results for input(s): TSH, T4TOTAL, T3FREE, THYROIDAB in the last 72 hours.  Invalid input(s): FREET3  Coagulation profile No results for input(s): INR, PROTIME in the last 168 hours. ------------------------------------------------------------------------------------------------------------------- No results for input(s): DDIMER in the last 72 hours. -------------------------------------------------------------------------------------------------------------------  Cardiac Enzymes No results for input(s): CKMB, TROPONINI, MYOGLOBIN in the last 168 hours.  Invalid input(s): CK ------------------------------------------------------------------------------------------------------------------ No results found for: BNP   ---------------------------------------------------------------------------------------------------------------  Urinalysis    Component  Value Date/Time   COLORURINE YELLOW 04/26/2016 0844  APPEARANCEUR CLEAR 04/26/2016 0844   LABSPEC 1.031* 04/26/2016 0844   PHURINE 5.5 04/26/2016 0844   GLUCOSEU >1000* 04/26/2016 0844   HGBUR SMALL* 04/26/2016 0844   BILIRUBINUR NEGATIVE 04/26/2016 0844   KETONESUR 15* 04/26/2016 0844   PROTEINUR NEGATIVE 04/26/2016 0844   UROBILINOGEN 0.2 12/12/2014 0150   NITRITE NEGATIVE 04/26/2016 0844   LEUKOCYTESUR NEGATIVE 04/26/2016 0844    ----------------------------------------------------------------------------------------------------------------   Imaging Results:    Dg Abd Acute W/chest  04/26/2016  CLINICAL DATA:  Vomiting, diarrhea. EXAM: DG ABDOMEN ACUTE W/ 1V CHEST COMPARISON:  None. FINDINGS: There is no evidence of dilated bowel loops or free intraperitoneal air. Status post cholecystectomy. Status post surgical fusion of lower lumbar spine. Left renal calculus is noted. Heart size and mediastinal contours are within normal limits. Atherosclerosis of abdominal aorta is noted. Both lungs are clear. IMPRESSION: Left renal calculus. No evidence of bowel obstruction or ileus. Aortic atherosclerosis. No acute cardiopulmonary disease. Electronically Signed   By: Marijo Conception, M.D.   On: 04/26/2016 08:05    My personal review of EKG: Rhythm NSR, Rate  57mn, QTc 502  no Acute ST changes   Assessment & Plan:    Active Problems:   Hypertension   Acid reflux   PAF (paroxysmal atrial fibrillation) (HCC)   Chronic combined systolic and diastolic heart failure (HCC)   Diabetes mellitus type 2, controlled (HCC)   Hyperlipidemia   Orthostasis   Nausea vomiting and diarrhea   [place]  Nausea vomiting and diarrhea  - This is secondary to gastroenteritis , most likely viral , patient remains significantly orthostatic despite receiving 1 L of IV fluids in ED , will be admitted for IV fluid hydration, will start on when necessary nausea medication , check labs in  a.m.  AKI - Continue to dehydration and volume depletion, hold ACE inhibitor, continue with IV fluids  Orthostasis - Continue to volume depletion and dehydration, hold ACE inhibitor, continue with beta blocker for heart rate control given his known history of A. fib, continue with hydration. - We'll consult PT giving his generalized weakness  Hypertension  - With metoprolol for heart rate control, hold ACE inhibitor, continue with hydration, patient is orthostatic   Chronic combined systolic and diastolic heart failure - Agent appears to be hypovolemic, continue to monitor as on IV fluids  GERD - Continue with PPI  Hyperlipidemia - Continue with statin  Paroxysmal atrial fibrillation - Currently NSR, continue with Xarelto for anticoagulation, continue with amiodarone and metoprolol  Diabetes mellitus - We'll hold oral hypoglycemic agents and start an insulin sliding scale   DVT Prophylaxis Xarelto  AM Labs Ordered, also please review Full Orders  Family Communication: Admission, patients condition and plan of care including tests being ordered have been discussed with the patient and daughter who indicate  understanding and agree with the plan and Code Status.  Code Status full code   Likely DC to  home   Condition GUARDED   Consults called:  none  Admission status:  observation  Time spent in minutes : 54mutes    Amand Lemoine M.D on 04/26/2016 at 1:57 PM  Between 7am to 7pm - Pager - [phone] 7pm go to [email or web address] - password [facility] - Office  [phone]

## 2016-04-27 DIAGNOSIS — E119 Type 2 diabetes mellitus without complications: Secondary | ICD-10-CM | POA: Diagnosis not present

## 2016-04-27 DIAGNOSIS — A084 Viral intestinal infection, unspecified: Secondary | ICD-10-CM

## 2016-04-27 DIAGNOSIS — N179 Acute kidney failure, unspecified: Secondary | ICD-10-CM | POA: Diagnosis not present

## 2016-04-27 DIAGNOSIS — I951 Orthostatic hypotension: Secondary | ICD-10-CM

## 2016-04-27 DIAGNOSIS — I5042 Chronic combined systolic (congestive) and diastolic (congestive) heart failure: Secondary | ICD-10-CM

## 2016-04-27 DIAGNOSIS — I10 Essential (primary) hypertension: Secondary | ICD-10-CM

## 2016-04-27 DIAGNOSIS — D696 Thrombocytopenia, unspecified: Secondary | ICD-10-CM

## 2016-04-27 DIAGNOSIS — I480 Paroxysmal atrial fibrillation: Secondary | ICD-10-CM

## 2016-04-27 LAB — GLUCOSE, CAPILLARY
Glucose-Capillary: 248 mg/dL — ABNORMAL HIGH (ref 65–99)
Glucose-Capillary: 258 mg/dL — ABNORMAL HIGH (ref 65–99)
Glucose-Capillary: 171 mg/dL — ABNORMAL HIGH (ref 65–99)

## 2016-04-27 LAB — BASIC METABOLIC PANEL
Anion gap: 8 (ref 5–15)
BUN: 21 mg/dL — ABNORMAL HIGH (ref 6–20)
CO2: 27 mmol/L (ref 22–32)
Calcium: 9.3 mg/dL (ref 8.9–10.3)
Chloride: 105 mmol/L (ref 101–111)
Creatinine, Ser: 0.93 mg/dL (ref 0.61–1.24)
GFR calc Af Amer: >60 mL/min (ref >60)
GFR calc non Af Amer: >60 mL/min (ref >60)
Glucose, Bld: 173 mg/dL — ABNORMAL HIGH (ref 65–99)
Potassium: 3.7 mmol/L (ref 3.5–5.1)
Sodium: 140 mmol/L (ref 135–145)

## 2016-04-27 LAB — CBC
HCT: 40.3 % (ref 39.0–52.0)
Hemoglobin: 13.4 g/dL (ref 13.0–17.0)
MCH: 31.4 pg (ref 26.0–34.0)
MCHC: 33.3 g/dL (ref 30.0–36.0)
MCV: 94.4 fL (ref 78.0–100.0)
Platelets: 107 10*3/uL — ABNORMAL LOW (ref 150–400)
RBC: 4.27 MIL/uL (ref 4.22–5.81)
RDW: 14.3 % (ref 11.5–15.5)
WBC: 6.1 10*3/uL (ref 4.0–10.5)

## 2016-04-27 MED ORDER — GLIMEPIRIDE 2 MG PO TABS
2.0000 mg | ORAL_TABLET | Freq: Every day | ORAL | Status: DC
  Administered 2016-04-27: 2 mg via ORAL
  Filled 2016-04-27: qty 1

## 2016-04-27 NOTE — Discharge Summary (Addendum)
Physician Discharge Summary  [name] YTK:[id number] DOB: 24-Jan-1941 DOA: 04/26/2016  PCP: Irven Shelling, MD  Admit date: 04/26/2016 Discharge date: 04/27/2016  Admitted From: home  Disposition:  home  Recommendations for Outpatient Follow-up:  1. Follow up with PCP in 1-2 weeks for blood pressure check and CBC to check platelets 2. Consider resumption of ACEI in 2 weeks pending BMP and blood pressure check  Home Health:  none Equipment/Devices:  none   Discharge Condition:  Stable, improved CODE STATUS:  full  Diet recommendation:  diabetic   Brief/Interim Summary:  [name] is a 75 y.o. male, with history of chronic atrial fibrillation, CAD, hypertension, diabetes mellitus type 2, OSA , presents with complaints of nausea vomiting and diarrhea this a.m., reportedly woke up at 5:00 with generalized weakness, strong nausea, had profound diarrhea, since then has been feeling generally weak, unable to stand up, reports one episode of vomiting as well, denies any coffee-ground emesis, no melena, no bright red blood per rectum, patient denies any sick contacts, cough, fever or chills or abdominal pain. In ED: Patient is afebrile, mild leukocytosis at 11k, with AKI of 1.38, patient received , 1 L of IV normal saline in NAD, despite that remained lightheaded, was orthostatic, sitting 134/106, standing 99/64, hospitalist requested to admit  Discharge Diagnoses:  Principal Problem:   Gastroenteritis and colitis, viral Active Problems:   Hypertension   Acid reflux   PAF (paroxysmal atrial fibrillation) (HCC)   Chronic combined systolic and diastolic heart failure (HCC)   Diabetes mellitus type 2, controlled ([place])   Hyperlipidemia   Orthostasis   Nausea vomiting and diarrhea   AKI (acute kidney injury) ([place])   Thrombocytopenia ([place])  Probable viral gastroenteritis.  He was given antiemetics and IVF and quickly improved.  C. Dif PCR was negative.  He was able to tolerate a  diabetic diet prior to discharge.  He did not have fevers, bloody stools, transaminitis to suggest more serious infection or hepatitis.    AKI and orthostatic hypotension due to dehydration.  Creatinine trended down with IVF and his orthostasis resolved. His ACEI has been held due to hypotension and AKI and he was asked to see his doctor for labs and blood pressure check before resuming this medication.    Hypertension, With metoprolol for heart rate control, hold ACE inhibitor, continue with hydration, patient is orthostatic   Chronic combined systolic and diastolic heart failure, appeared euvolemic at discharge.  ACEI held due to [employer] and hypotension.  Continue beta blocker.    GERD, stable, continued PPI  Hyperlipidemia, stable, continued statin  Paroxysmal atrial fibrillation, NSR on admission.  Was not on telemetry.  Continued Xarelto for anticoagulation and amiodarone.  Patient advised to check his blood pressure on 7/2 and if his sBP is greater than 100, to resume his metoprolol  Diabetes mellitus, type 2.  Resumed oral diabetes medications at discharge.   Thrombocytopenia, mild and acute phase reactant.  Doubt HUS/TTP.  Recommend repeat CBC in about 2 weeks to confirm platelets have recovered and further work up if persistently low.  Discharge Instructions      Discharge Instructions    Call MD for:  difficulty breathing, headache or visual disturbances    Complete by:  As directed      Call MD for:  extreme fatigue    Complete by:  As directed      Call MD for:  hives    Complete by:  As directed  Call MD for:  persistant dizziness or light-headedness    Complete by:  As directed      Call MD for:  persistant nausea and vomiting    Complete by:  As directed      Call MD for:  severe uncontrolled pain    Complete by:  As directed      Call MD for:  temperature >100.4    Complete by:  As directed      Diet - low sodium heart healthy    Complete by:  As directed       Discharge instructions    Complete by:  As directed   Please resume your metoprolol tomorrow as long as your blood pressure in the morning is greater than 629 mmHg systolic (top number).  Please do not take your ramipril unless your blood pressure goes above 476 systolic consistently.  Please see your doctor in 1-2 weeks to have bloodwork done to make sure your platelet count has gone back to normal.     Increase activity slowly    Complete by:  As directed             Medication List    STOP taking these medications        HYDROcodone-acetaminophen 5-325 MG tablet  Commonly known as:  NORCO/VICODIN     levETIRAcetam 250 MG tablet  Commonly known as:  KEPPRA     methocarbamol 500 MG tablet  Commonly known as:  ROBAXIN     NORCO 5-325 MG tablet  Generic drug:  HYDROcodone-acetaminophen     nystatin 100000 UNIT/ML suspension  Commonly known as:  MYCOSTATIN     ondansetron 4 MG tablet  Commonly known as:  ZOFRAN     ramipril 10 MG capsule  Commonly known as:  ALTACE      TAKE these medications        amiodarone 200 MG tablet  Commonly known as:  PACERONE  Take 1 tablet (200 mg total) by mouth daily.     atorvastatin 10 MG tablet  Commonly known as:  LIPITOR  Take 10 mg by mouth daily.     Co Q 10 100 MG Caps  Take 100 mg by mouth daily.     diazepam 5 MG tablet  Commonly known as:  VALIUM  Take 5 mg by mouth 2 (two) times daily as needed (leg cramps/ sleep).     glimepiride 2 MG tablet  Commonly known as:  AMARYL  Take 2 mg by mouth daily with breakfast.     JARDIANCE 10 MG Tabs tablet  Generic drug:  empagliflozin  Take 10 mg by mouth daily with breakfast.     metFORMIN 500 MG 24 hr tablet  Commonly known as:  GLUCOPHAGE-XR  Take 1,000 mg by mouth 2 (two) times daily.     metoprolol 50 MG tablet  Commonly known as:  LOPRESSOR  TAKE ONE TABLET BY MOUTH TWICE DAILY     multivitamin with minerals Tabs tablet  Take 1 tablet by mouth daily.      omeprazole 20 MG capsule  Commonly known as:  PRILOSEC  Take 20 mg by mouth daily.     oxyCODONE-acetaminophen 5-325 MG tablet  Commonly known as:  PERCOCET/ROXICET  Take 1 tablet by mouth every 6 (six) hours as needed (pain).     pyridOXINE 100 MG tablet  Commonly known as:  VITAMIN B-6  Take 100 mg by mouth daily.     rivaroxaban 20 MG  Tabs tablet  Commonly known as:  XARELTO  Take 1 tablet (20 mg total) by mouth daily with supper.     vitamin B-12 1000 MCG tablet  Commonly known as:  CYANOCOBALAMIN  Take 1,000 mcg by mouth daily.        Allergies  Allergen Reactions  . Cymbalta [Duloxetine Hcl] Nausea And Vomiting and Other (See Comments)    Rapid drop in blood pressure  . Neurontin [Gabapentin] Other (See Comments)    Makes me goofy, keeps me off balance, clouds my thinking  . Lyrica [Pregabalin] Other (See Comments)    Makes me goofy, keeps me off balance, clouds my thinking  . Keppra [Levetiracetam] Nausea And Vomiting    Consultations: none    Procedures/Studies: Dg Abd Acute W/chest  04/26/2016  CLINICAL DATA:  Vomiting, diarrhea. EXAM: DG ABDOMEN ACUTE W/ 1V CHEST COMPARISON:  None. FINDINGS: There is no evidence of dilated bowel loops or free intraperitoneal air. Status post cholecystectomy. Status post surgical fusion of lower lumbar spine. Left renal calculus is noted. Heart size and mediastinal contours are within normal limits. Atherosclerosis of abdominal aorta is noted. Both lungs are clear. IMPRESSION: Left renal calculus. No evidence of bowel obstruction or ileus. Aortic atherosclerosis. No acute cardiopulmonary disease. Electronically Signed   By: Marijo Conception, M.D.   On: 04/26/2016 08:05   Subjective: Tolerating diet.  Diarrhea slowing down and no longer nauseated.  Ate almost entire breakfast.  Asking to go home.    Discharge Exam: Filed Vitals:   04/27/16 1100 04/27/16 1406  BP: 112/62 105/54  Pulse:  70  Temp:  98.5 F (36.9 C)  Resp:  19    Filed Vitals:   04/26/16 2110 04/27/16 0518 04/27/16 1100 04/27/16 1406  BP: 114/70 105/72 112/62 105/54  Pulse: 76 73  70  Temp: 98.8 F (37.1 C) 97.8 F (36.6 C)  98.5 F (36.9 C)  TempSrc: Oral Oral    Resp: '17 15  19  '$ Height:      Weight:      SpO2: 98% 97%  96%    General: Pt is alert, awake, not in acute distress, eating breakfast Cardiovascular: RRR, S1/S2 +, no rubs, no gallops Respiratory: CTA bilaterally, no wheezing, no rhonchi Abdominal: Soft, NT, ND, bowel sounds + Extremities: trace bilateral lower extremity edema, no cyanosis    The results of significant diagnostics from this hospitalization (including imaging, microbiology, ancillary and laboratory) are listed below for reference.     Microbiology: Recent Results (from the past 240 hour(s))  C difficile quick scan w PCR reflex     Status: None   Collection Time: 04/26/16  4:59 PM  Result Value Ref Range Status   C Diff antigen NEGATIVE NEGATIVE Final   C Diff toxin NEGATIVE NEGATIVE Final   C Diff interpretation Negative for toxigenic C. difficile  Final     Labs: BNP (last 3 results) No results for input(s): BNP in the last 8760 hours. Basic Metabolic Panel:  Recent Labs Lab 04/26/16 0721 04/27/16 0525  NA 139 140  K 3.8 3.7  CL 102 105  CO2 24 27  GLUCOSE 222* 173*  BUN 35* 21*  CREATININE 1.38* 0.93  CALCIUM 9.8 9.3   Liver Function Tests:  Recent Labs Lab 04/26/16 0721  AST 25  ALT 26  ALKPHOS 55  BILITOT 0.6  PROT 6.6  ALBUMIN 3.7    Recent Labs Lab 04/26/16 0721  LIPASE 25   No results for input(s):  AMMONIA in the last 168 hours. CBC:  Recent Labs Lab 04/26/16 0721 04/27/16 0525  WBC 11.0* 6.1  NEUTROABS 8.5*  --   HGB 14.2 13.4  HCT 43.1 40.3  MCV 92.7 94.4  PLT 121* 107*   Cardiac Enzymes: No results for input(s): CKTOTAL, CKMB, CKMBINDEX, TROPONINI in the last 168 hours. BNP: Invalid input(s): POCBNP CBG:  Recent Labs Lab 04/26/16 1622  04/26/16 2111 04/27/16 0736 04/27/16 1143 04/27/16 1637  GLUCAP 267* 179* 248* 258* 171*   D-Dimer No results for input(s): DDIMER in the last 72 hours. Hgb A1c No results for input(s): HGBA1C in the last 72 hours. Lipid Profile No results for input(s): CHOL, HDL, LDLCALC, TRIG, CHOLHDL, LDLDIRECT in the last 72 hours. Thyroid function studies No results for input(s): TSH, T4TOTAL, T3FREE, THYROIDAB in the last 72 hours.  Invalid input(s): FREET3 Anemia work up No results for input(s): VITAMINB12, FOLATE, FERRITIN, TIBC, IRON, RETICCTPCT in the last 72 hours. Urinalysis    Component Value Date/Time   COLORURINE YELLOW 04/26/2016 0844   APPEARANCEUR CLEAR 04/26/2016 0844   LABSPEC 1.031* 04/26/2016 0844   PHURINE 5.5 04/26/2016 0844   GLUCOSEU >1000* 04/26/2016 0844   HGBUR SMALL* 04/26/2016 0844   BILIRUBINUR NEGATIVE 04/26/2016 0844   KETONESUR 15* 04/26/2016 0844   PROTEINUR NEGATIVE 04/26/2016 0844   UROBILINOGEN 0.2 12/12/2014 0150   NITRITE NEGATIVE 04/26/2016 0844   LEUKOCYTESUR NEGATIVE 04/26/2016 0844   Sepsis Labs Invalid input(s): PROCALCITONIN,  WBC,  LACTICIDVEN Microbiology Recent Results (from the past 240 hour(s))  C difficile quick scan w PCR reflex     Status: None   Collection Time: 04/26/16  4:59 PM  Result Value Ref Range Status   C Diff antigen NEGATIVE NEGATIVE Final   C Diff toxin NEGATIVE NEGATIVE Final   C Diff interpretation Negative for toxigenic C. difficile  Final     Time coordinating discharge: Over 30 minutes  SIGNED:   Janece Canterbury, MD  [facility] 04/27/2016, 7:43 PM Pager   If 7PM-7AM, please contact night-coverage [email or web address] Password [email or web address]

## 2016-04-27 NOTE — Progress Notes (Signed)
[name] discharged Home with wife per MD order.  Discharge instructions reviewed and discussed with the patient, all questions and concerns answered. Copy of instructions given to patient.    Medication List    STOP taking these medications        HYDROcodone-acetaminophen 5-325 MG tablet  Commonly known as:  NORCO/VICODIN     levETIRAcetam 250 MG tablet  Commonly known as:  KEPPRA     methocarbamol 500 MG tablet  Commonly known as:  ROBAXIN     NORCO 5-325 MG tablet  Generic drug:  HYDROcodone-acetaminophen     nystatin 100000 UNIT/ML suspension  Commonly known as:  MYCOSTATIN     ondansetron 4 MG tablet  Commonly known as:  ZOFRAN     ramipril 10 MG capsule  Commonly known as:  ALTACE      TAKE these medications        amiodarone 200 MG tablet  Commonly known as:  PACERONE  Take 1 tablet (200 mg total) by mouth daily.     atorvastatin 10 MG tablet  Commonly known as:  LIPITOR  Take 10 mg by mouth daily.     Co Q 10 100 MG Caps  Take 100 mg by mouth daily.     diazepam 5 MG tablet  Commonly known as:  VALIUM  Take 5 mg by mouth 2 (two) times daily as needed (leg cramps/ sleep).     glimepiride 2 MG tablet  Commonly known as:  AMARYL  Take 2 mg by mouth daily with breakfast.     JARDIANCE 10 MG Tabs tablet  Generic drug:  empagliflozin  Take 10 mg by mouth daily with breakfast.     metFORMIN 500 MG 24 hr tablet  Commonly known as:  GLUCOPHAGE-XR  Take 1,000 mg by mouth 2 (two) times daily.     metoprolol 50 MG tablet  Commonly known as:  LOPRESSOR  TAKE ONE TABLET BY MOUTH TWICE DAILY     multivitamin with minerals Tabs tablet  Take 1 tablet by mouth daily.     omeprazole 20 MG capsule  Commonly known as:  PRILOSEC  Take 20 mg by mouth daily.     oxyCODONE-acetaminophen 5-325 MG tablet  Commonly known as:  PERCOCET/ROXICET  Take 1 tablet by mouth every 6 (six) hours as needed (pain).     pyridOXINE 100 MG tablet  Commonly known as:   VITAMIN B-6  Take 100 mg by mouth daily.     rivaroxaban 20 MG Tabs tablet  Commonly known as:  XARELTO  Take 1 tablet (20 mg total) by mouth daily with supper.     vitamin B-12 1000 MCG tablet  Commonly known as:  CYANOCOBALAMIN  Take 1,000 mcg by mouth daily.        Patients skin is clean, dry and intact, no evidence of skin break down. IV site discontinued and catheter remains intact. Site without signs and symptoms of complications. Dressing and pressure applied.  Patient escorted to car by NT in a wheelchair,  no distress noted upon discharge.  Wynetta Emery, Erica Osuna C 04/27/2016 6:12 PM

## 2016-04-27 NOTE — Evaluation (Signed)
Physical Therapy Evaluation and Discharge Patient Details Name: [name] MRN: [id number] DOB: August 24, 1941 Today's Date: 04/27/2016   History of Present Illness  Honest Vanleer is a 75 y.o. male, with history of chronic atrial fibrillation, CAD, hypertension, diabetes mellitus type 2, OSA , presents with complaints of nausea vomiting and diarrhea this a.m., reportedly woke up at 5:00 with generalized weakness, strong nausea, had profound diarrhea, since then has been feeling generally weak, unable to stand up, reports one episode of vomiting as well, denies any coffee-ground emesis, no melena, no bright red blood per rectum, patient denies any sick contacts, cough, fever or chills or abdominal pain.  Clinical Impression  Patient evaluated by Physical Therapy with no further acute PT needs identified. All education has been completed and the patient has no further questions. Patient feels nearly back to his baseline aside from slight generalized weakness. We discussed safety risk and potential follow-up for his hx of peripheral neuropathy that has altered his balance and gait. Very motivated with good family support available from wife. See below for any follow-up Physical Therapy or equipment needs. PT is signing off. Thank you for this referral.     Follow Up Recommendations Outpatient PT    Equipment Recommendations  None recommended by PT    Recommendations for Other Services       Precautions / Restrictions Precautions Precautions: None Restrictions Weight Bearing Restrictions: No      Mobility  Bed Mobility Overal bed mobility: Independent                Transfers Overall transfer level: Modified independent Equipment used: Rolling walker (2 wheeled)             General transfer comment: Performed x2 from bed without physical assist. Use of RW required once upright for balance.  Ambulation/Gait Ambulation/Gait assistance: Min guard Ambulation Distance (Feet): 200  Feet Assistive device: Rolling walker (2 wheeled) Gait Pattern/deviations: Step-through pattern;Decreased stride length;Decreased dorsiflexion - right;Decreased dorsiflexion - left;Narrow base of support;Staggering right Gait velocity: decreased Gait velocity interpretation: Below normal speed for age/gender General Gait Details: Supervision for safety with RW for support. Close guard without RW demonstrating staggering and poor LE coordination (hx neuropathy.) Cues for safety. Able to step backwards without loss of balance. Denies dizziness, or lightheadedness VSS.  Stairs            Wheelchair Mobility    Modified Rankin (Stroke Patients Only)       Balance Overall balance assessment: Needs assistance;History of Falls Sitting-balance support: No upper extremity supported;Feet supported Sitting balance-Leahy Scale: Normal     Standing balance support: No upper extremity supported Standing balance-Leahy Scale: Fair                               Pertinent Vitals/Pain Pain Assessment: No/denies pain    Home Living Family/patient expects to be discharged to:: Private residence Living Arrangements: Spouse/significant other Available Help at Discharge: Family;Available 24 hours/day Type of Home: House Home Access: Stairs to enter Entrance Stairs-Rails: Psychiatric nurse of Steps: 3 steps x2 Home Layout: Two level;Able to live on main level with bedroom/bathroom Home Equipment: Shower seat;Grab bars - toilet;Grab bars - tub/shower;Hand held shower head;Walker - 4 wheels;Cane - single point      Prior Function Level of Independence: Independent with assistive device(s)         Comments: Uses rollator when outdoors, wife guards for safety during shower  due to neuropathy     Hand Dominance   Dominant Hand: Right    Extremity/Trunk Assessment   Upper Extremity Assessment: Defer to OT evaluation           Lower Extremity  Assessment: RLE deficits/detail;LLE deficits/detail RLE Deficits / Details: Distal weakness and decreased light touch sensation ;hx of neuropathy LLE Deficits / Details: Distal weakness and decreased light touch sensation ;hx of neuropathy     Communication   Communication: No difficulties  Cognition Arousal/Alertness: Awake/alert Behavior During Therapy: WFL for tasks assessed/performed Overall Cognitive Status: Within Functional Limits for tasks assessed                      General Comments General comments (skin integrity, edema, etc.): Discussed resumption of home exercises (swimming, walking) and consideration of OPPT to further address balance and gait training. Safety with home mobility and effects of peripheral neuropathy on fall risk. Sitting BP 112/62 HR 72 - 113/53 HR 72    Exercises        Assessment/Plan    PT Assessment Patent does not need any further PT services  PT Diagnosis Abnormality of gait;Difficulty walking   PT Problem List    PT Treatment Interventions     PT Goals (Current goals can be found in the Care Plan section) Acute Rehab PT Goals Patient Stated Goal: Get rid of neuropathy PT Goal Formulation: All assessment and education complete, DC therapy    Frequency     Barriers to discharge        Co-evaluation               End of Session   Activity Tolerance: Patient tolerated treatment well Patient left: in bed;with call bell/phone within reach;with nursing/sitter in room;with family/visitor present Nurse Communication: Mobility status    Functional Assessment Tool Used: clinical observation Functional Limitation: Mobility: Walking and moving around Mobility: Walking and Moving Around Current Status ([id number]): At least 1 percent but less than 20 percent impaired, limited or restricted Mobility: Walking and Moving Around Goal Status [phone]): At least 1 percent but less than 20 percent impaired, limited or restricted Mobility:  Walking and Moving Around Discharge Status [phone]): At least 1 percent but less than 20 percent impaired, limited or restricted    Time: 1030-1100 PT Time Calculation (min) (ACUTE ONLY): 30 min   Charges:   PT Evaluation $PT Eval Low Complexity: 1 Procedure PT Treatments $Self Care/Home Management: 8-22   PT G Codes:   PT G-Codes **NOT FOR INPATIENT CLASS** Functional Assessment Tool Used: clinical observation Functional Limitation: Mobility: Walking and moving around Mobility: Walking and Moving Around Current Status ([id number]): At least 1 percent but less than 20 percent impaired, limited or restricted Mobility: Walking and Moving Around Goal Status [phone]): At least 1 percent but less than 20 percent impaired, limited or restricted Mobility: Walking and Moving Around Discharge Status [phone]): At least 1 percent but less than 20 percent impaired, limited or restricted    Ellouise Newer 04/27/2016, 11:25 AM  Elayne Snare, [place]

## 2016-04-28 ENCOUNTER — Other Ambulatory Visit: Payer: Self-pay | Admitting: Interventional Cardiology

## 2016-06-25 ENCOUNTER — Ambulatory Visit (INDEPENDENT_AMBULATORY_CARE_PROVIDER_SITE_OTHER): Payer: Medicare Other | Admitting: Interventional Cardiology

## 2016-06-25 ENCOUNTER — Encounter: Payer: Self-pay | Admitting: Interventional Cardiology

## 2016-06-25 VITALS — BP 104/62 | HR 62 | Ht 73.0 in | Wt 204.4 lb

## 2016-06-25 DIAGNOSIS — I10 Essential (primary) hypertension: Secondary | ICD-10-CM

## 2016-06-25 DIAGNOSIS — I5042 Chronic combined systolic (congestive) and diastolic (congestive) heart failure: Secondary | ICD-10-CM | POA: Diagnosis not present

## 2016-06-25 DIAGNOSIS — I714 Abdominal aortic aneurysm, without rupture, unspecified: Secondary | ICD-10-CM

## 2016-06-25 DIAGNOSIS — Z79899 Other long term (current) drug therapy: Secondary | ICD-10-CM

## 2016-06-25 DIAGNOSIS — I480 Paroxysmal atrial fibrillation: Secondary | ICD-10-CM | POA: Diagnosis not present

## 2016-06-25 DIAGNOSIS — I2510 Atherosclerotic heart disease of native coronary artery without angina pectoris: Secondary | ICD-10-CM

## 2016-06-25 NOTE — Patient Instructions (Signed)
Medication Instructions:  Your physician recommends that you continue on your current medications as directed. Please refer to the Current Medication list given to you today.   Labwork: None   Testing/Procedures: None   Follow-Up: Your physician wants you to follow-up in: 1 year with Dr Tamala Julian. (August 2018)  You will receive a reminder letter in the mail two months in advance. If you don't receive a letter, please call our office to schedule the follow-up appointment.        If you need a refill on your cardiac medications before your next appointment, please call your pharmacy.

## 2016-06-25 NOTE — Progress Notes (Signed)
Cardiology Office Note    Date:  06/25/2016   ID:  [name] Jun 20, 1941, MRN [id number]  PCP:  Irven Shelling, MD  Cardiologist: Sinclair Grooms, MD   Chief Complaint  Patient presents with  . Coronary Artery Disease  . Atrial Fibrillation    History of Present Illness:  [name] is a 75 y.o. male for coronary artery disease, chronic combined systolic and diastolic heart failure, prior circumflex, LAD stenting, diabetes mellitus, atrial fibrillation, chronic anticoagulation, and history of severe polyneuropathy involving the lower extremities.  [name] is doing relatively well from cardiac standpoint. He has not needed to use nitroglycerin. He wonders if he has very mild chest tightness from time to time. There is no exertional component. He denies palpitations. No acute onset of shortness of breath. No recurrences of atrial fibrillation recently.   Past Medical History:  Diagnosis Date  . AAA (abdominal aortic aneurysm) ([place])   . Acid reflux    takes Prilosec and Protonix daily  . Arthritis    BacK  . Complication of anesthesia    -years ago hair fell out.;ileus after 2 of his surgeries  . Coronary atherosclerosis of native coronary artery    ejection fraction of 40%, prior inferior myocardial infarction in February 1993, PTCA, LAD rotational atherectomy in August of 1998, acute inferolateral myocardial infarction, ventricular fibrillation in July of 1999, PTCA and stent of obtuse marginal 1, 100% RCA 1/12 with collaterals from patent circumflex and LAD  . Dry skin   . Dysrhythmia    atrial fibrillation  . Foot drop, bilateral   . Hypercholesteremia    takes lipitor daily  . Hypertension    takes Ramipril daily  . Ileus ([place])    After AAA  . Incisional hernia    "small, from AAA"  . MI (myocardial infarction) ([place])    1992 and 1997 sees Dr. Linard Millers as needed   . Neuropathy (HCC)    in feet & legs  . OSA on CPAP    uses CPAP--sleep study done at  least 6yr ago  . Pain    back pain chronic- seen at pain clinic  . Plantar fasciitis    bilatetral  . Type II diabetes mellitus ([place]     Past Surgical History:  Procedure Laterality Date  . ABDOMINAL AORTIC ANEURYSM REPAIR    . BACK SURGERY  2012-2013 X 3   Miminal Invasive x 3in [place].  .Marland KitchenCARDIAC CATHETERIZATION  2009/2012  . CARDIOVERSION N/A 04/12/2015   Procedure: CARDIOVERSION;  Surgeon: TSueanne Margarita MD;  Location: [facility];  Service: Cardiovascular;  Laterality: N/A;  . COLONOSCOPY    . CORONARY ANGIOPLASTY WITH STENT PLACEMENT  1992; 1997  . LAPAROSCOPIC CHOLECYSTECTOMY    . LUMBAR LAMINECTOMY/DECOMPRESSION MICRODISCECTOMY Right 01/16/2016   Procedure: Right Lumbar five-Sacral one Laminectomy;  Surgeon: HKristeen Miss MD;  [facility];  Service: Neurosurgery;  Laterality: Right;  Right L5-S1 Laminectomy    Current Medications: Outpatient Medications Prior to Visit  Medication Sig Dispense Refill  . amiodarone (PACERONE) 200 MG tablet TAKE ONE TABLET BY MOUTH ONCE DAILY 90 tablet 1  . atorvastatin (LIPITOR) 10 MG tablet Take 10 mg by mouth daily.    . Coenzyme Q10 (CO Q 10) 100 MG CAPS Take 100 mg by mouth daily.    . diazepam (VALIUM) 5 MG tablet Take 5 mg by mouth 2 (two) times daily as needed (leg cramps/ sleep).     .[name]  empagliflozin (JARDIANCE) 10 MG TABS tablet Take 10 mg by mouth daily with breakfast.     . glimepiride (AMARYL) 2 MG tablet Take 2 mg by mouth daily with breakfast.     . metFORMIN (GLUCOPHAGE-XR) 500 MG 24 hr tablet Take 1,000 mg by mouth 2 (two) times daily.     . metoprolol (LOPRESSOR) 50 MG tablet TAKE ONE TABLET BY MOUTH TWICE DAILY 180 tablet 2  . Multiple Vitamin (MULTIVITAMIN WITH MINERALS) TABS Take 1 tablet by mouth daily.    [name] omeprazole (PRILOSEC) 20 MG capsule Take 20 mg by mouth daily.    [name] oxyCODONE-acetaminophen (PERCOCET/ROXICET) 5-325 MG tablet Take 1 tablet by mouth every 6 (six) hours as needed (pain).     [name]  pyridOXINE (VITAMIN B-6) 100 MG tablet Take 100 mg by mouth daily.    . rivaroxaban (XARELTO) 20 MG TABS tablet Take 1 tablet (20 mg total) by mouth daily with supper. 30 tablet 5  . vitamin B-12 (CYANOCOBALAMIN) 1000 MCG tablet Take 1,000 mcg by mouth daily.      No facility-administered medications prior to visit.      Allergies:   Cymbalta [duloxetine hcl]; Neurontin [gabapentin]; Lyrica [pregabalin]; and Keppra [levetiracetam]   Social History   Social History  . Marital status: Married    Spouse name: N/A  . Number of children: N/A  . Years of education: N/A   Social History Main Topics  . Smoking status: Former Smoker    Packs/day: 1.00    Years: 33.00    Types: Cigarettes    Quit date: 01/01/1992  . Smokeless tobacco: Never Used  . Alcohol use Yes     Comment: 6/30;2017 "GLASS OF WINE Q COUPLE MONTHS, IF THAT"  . Drug use: No  . Sexual activity: Yes   Other Topics Concern  . None   Social History Narrative  . None     Family History:  The patient's family history includes Arthritis in his father; Coronary artery disease in his father and mother; Diabetes in his father and mother; Rheum arthritis in his father.   ROS:   Please see the history of present illness.    Back and leg discomfort and bilateral foot numbness. All leg pain other systems reviewed and are negative.   PHYSICAL EXAM:   VS:  BP 104/62   Pulse 62   Ht '6\' 1"'$  (1.854 m)   Wt 204 lb 6.4 oz (92.7 kg)   BMI 26.97 kg/m    GEN: Well nourished, well developed, in no acute distress  HEENT: normal  Neck: no JVD, carotid bruits, or masses Cardiac: RRR; no murmurs, rubs, or gallops,no edema  Respiratory:  clear to auscultation bilaterally, normal work of breathing GI: soft, nontender, nondistended, + BS MS: no deformity or atrophy  Skin: warm and dry, no rash Neuro:  Alert and Oriented x 3, Strength and sensation are intact Psych: euthymic mood, full affect  Wt Readings from Last 3 Encounters:    06/25/16 204 lb 6.4 oz (92.7 kg)  04/26/16 198 lb 1.6 oz (89.9 kg)  01/16/16 212 lb (96.2 kg)      Studies/Labs Reviewed:   EKG:  EKG  Sinus rhythm, inferolateral Q waves consistent with prior infarction. NonSpecific ST-T abnormality. Since the last tracing, heart rate is slower.  Recent Labs: 07/31/2015: TSH 1.00 04/26/2016: ALT 26 04/27/2016: BUN 21; Creatinine, Ser 0.93; Hemoglobin 13.4; Platelets 107; Potassium 3.7; Sodium 140   Lipid Panel    Component Value Date/Time  CHOL  11/26/2010 0750    147        ATP III CLASSIFICATION:  <200     mg/dL   Desirable  200-239  mg/dL   Borderline High  >=240    mg/dL   High          TRIG 132 11/26/2010 0750   HDL 29 (L) 11/26/2010 0750   CHOLHDL 5.1 11/26/2010 0750   VLDL 26 11/26/2010 0750   LDLCALC  11/26/2010 0750    92        Total Cholesterol/HDL:CHD Risk Coronary Heart Disease Risk Table                     Men   Women  1/2 Average Risk   3.4   3.3  Average Risk       5.0   4.4  2 X Average Risk   9.6   7.1  3 X Average Risk  23.4   11.0        Use the calculated Patient Ratio above and the CHD Risk Table to determine the patient's CHD Risk.        ATP III CLASSIFICATION (LDL):  <100     mg/dL   Optimal  100-129  mg/dL   Near or Above                    Optimal  130-159  mg/dL   Borderline  160-189  mg/dL   High  >190     mg/dL   Very High    Additional studies/ records that were reviewed today include:  No new electronic health record data.   ASSESSMENT:    1. Atherosclerosis of native coronary artery of native heart without angina pectoris   2. Chronic combined systolic and diastolic heart failure ([place])   3. Essential hypertension   4. PAF (paroxysmal atrial fibrillation) ([place])   5. On amiodarone therapy   6. Abdominal aortic aneurysm (AAA) without rupture (HCC)      PLAN:  In order of problems listed above:  1. Stable coronary disease without symptoms that suggest angina pectoris. He understands  and 05 and stiff recurring episodes of chest pain or other complaints. 2. No evidence of volume overload. 2 g sodium diet is advocated. 3. 2 g sodium diet. Blood pressure target 140/90 year last period 4. Continue amiodarone and follow-up for possible amiodarone toxicity. 5. TSH, Hepatic panel, will be performed in 6 months. 5    On the next visit, we will need to set the patient up to have the aortic aneurysm reimaged. It has been greater than a year since imaging was completed. At that time the aneurysm was 3.1 x 3.7 cm.(Stable 3.1 x 3.7 cm infrarenal aortic aneurysm, status post apparentaortoiliac surgical repair appearing Recommend followup by ultrasound in 2 year).     Medication Adjustments/Labs and Tests Ordered: Current medicines are reviewed at length with the patient today.  Concerns regarding medicines are outlined above.  Medication changes, Labs and Tests ordered today are listed in the Patient Instructions below. Patient Instructions  Medication Instructions:  Your physician recommends that you continue on your current medications as directed. Please refer to the Current Medication list given to you today.   Labwork: None   Testing/Procedures: None   Follow-Up: Your physician wants you to follow-up in: 1 year with Dr Tamala Julian. (August 2018)  You will receive a reminder letter in the mail two months in advance. If  you don't receive a letter, please call our office to schedule the follow-up appointment.        If you need a refill on your cardiac medications before your next appointment, please call your pharmacy.      Signed, Sinclair Grooms, MD  06/25/2016 1:22 PM    [facility] [address], [place], [place]  [address] Phone: [phone]; Fax: [phone]

## 2016-07-17 IMAGING — CR DG CHEST 1V PORT
1 series · 1 of 1 positions shown · non-contrast
Comparison: 11/07/2013

CLINICAL DATA: Nausea vomiting diarrhea and dyspnea

EXAM:
PORTABLE CHEST - 1 VIEW

[AP]
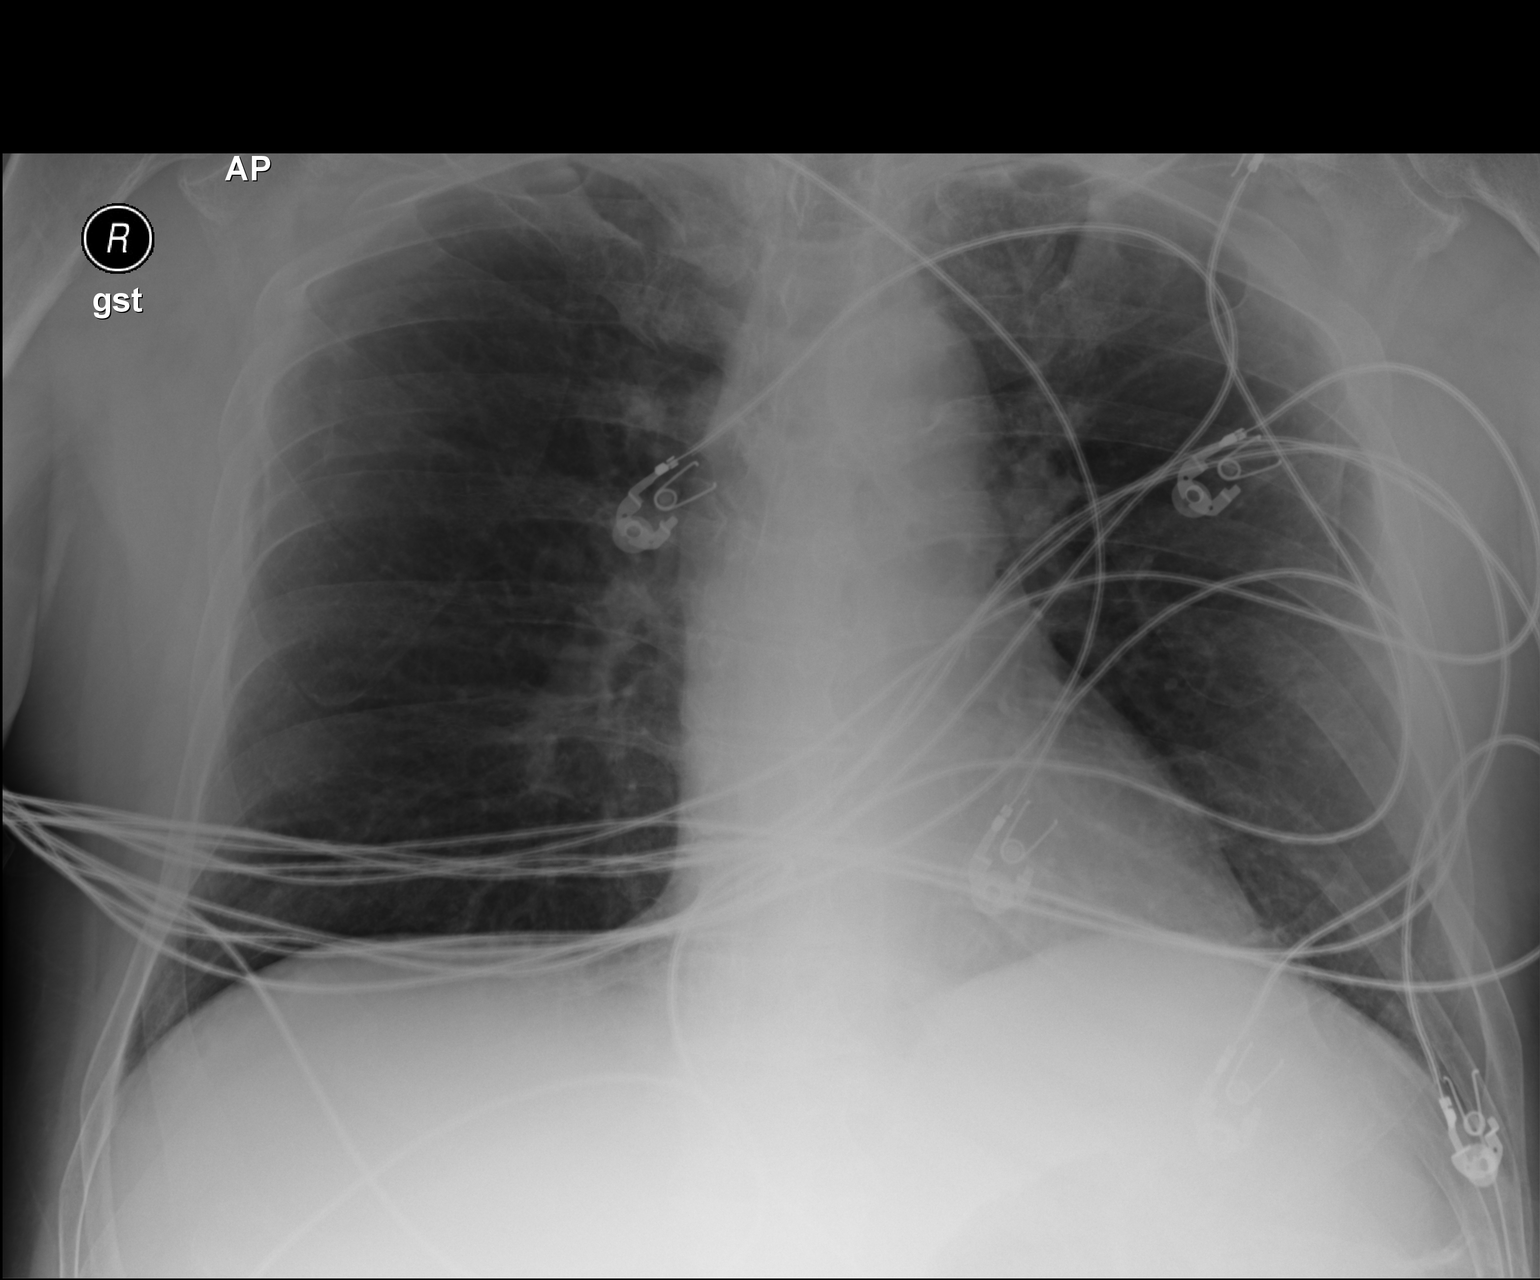

[1 of 1 positions shown; findings below may reference images not displayed]

FINDINGS: A single AP portable view of the chest demonstrates no focal
airspace consolidation or alveolar edema. The lungs are grossly
clear. There is no large effusion or pneumothorax. Cardiac and
mediastinal contours appear unremarkable. There is no significant
interval change.
IMPRESSION: No acute findings

## 2016-07-18 IMAGING — CT CT ABD-PELV W/ CM
2 of 5 series · 15 of 46 positions shown, 17 images · IV contrast (APPLIED)
Comparison: MRI of the abdomen June 23, 2014 and CT of the
abdomen and pelvis June 10, 2014

CLINICAL DATA: Nausea, vomiting and diarrhea beginning yesterday at
noon, weakness, pre syncopal. Hypotensive. Tachycardia. History of
abdominal aortic aneurysm, acid reflux, incisional hernia.

EXAM:
CT ABDOMEN AND PELVIS WITH CONTRAST
TECHNIQUE: Multidetector CT imaging of the abdomen and pelvis was performed
using the standard protocol following bolus administration of
intravenous contrast.
CONTRAST:  100 cc Omnipaque 300

[Series 2: abd/ pelvis 5.0 i30f 1 · axial · 0.90mm/px · z∈[+777,+1202]mm · 12 of 97 slices shown, 14 images]
[im 6/97  soft-tissue]
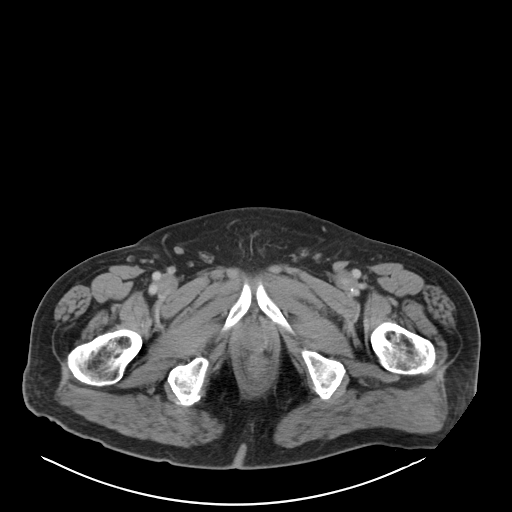
[im 6/97  bone]
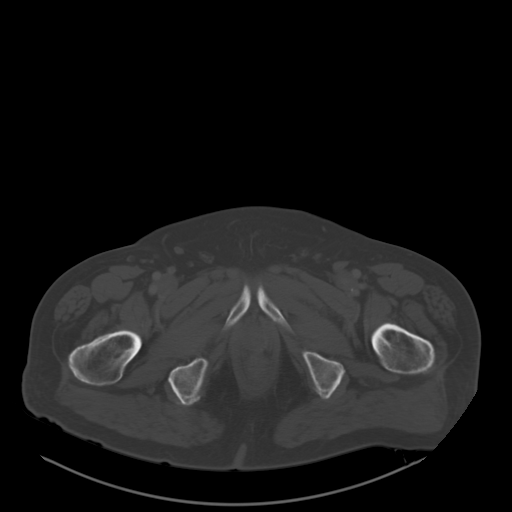
[im 17/97  soft-tissue]
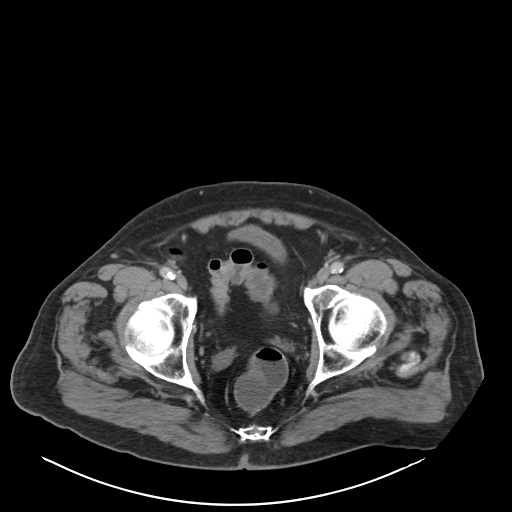
[im 23/97  soft-tissue]
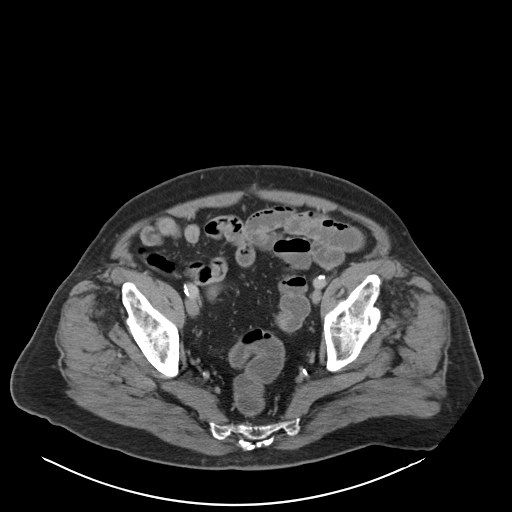
[im 29/97  soft-tissue]
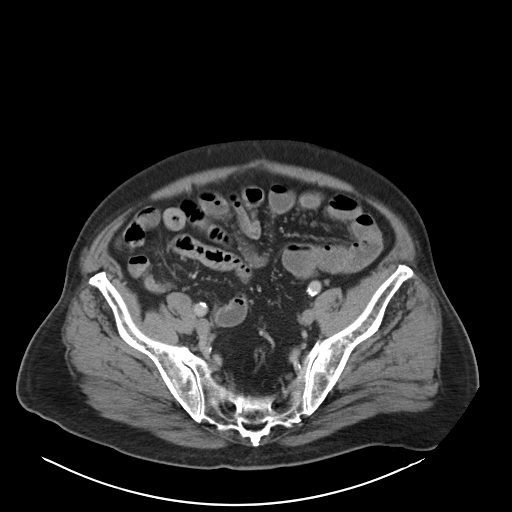
[im 40/97  soft-tissue]
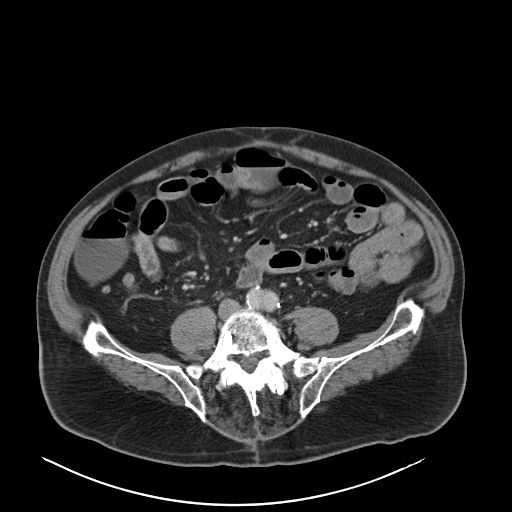
[im 46/97  soft-tissue]
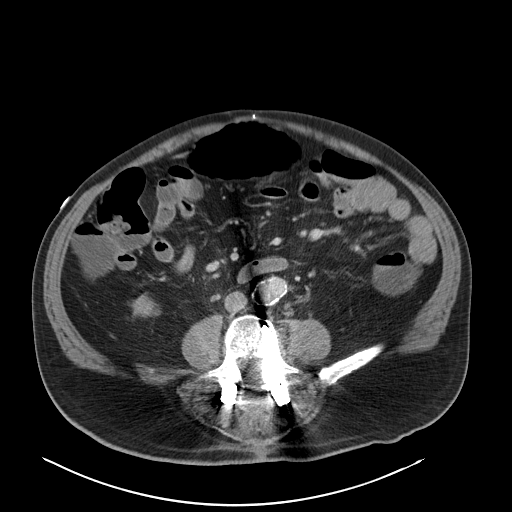
[im 51/97  soft-tissue]
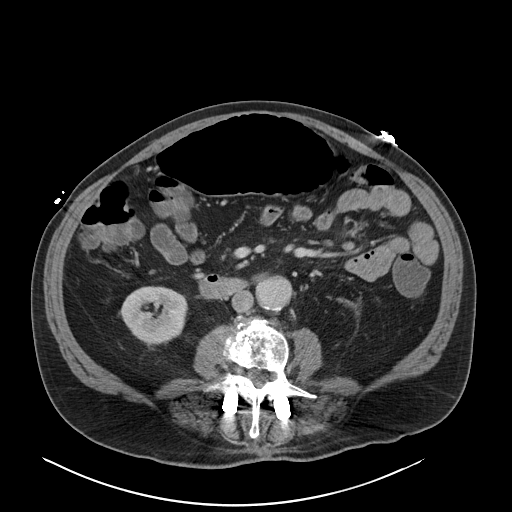
[im 63/97  soft-tissue]
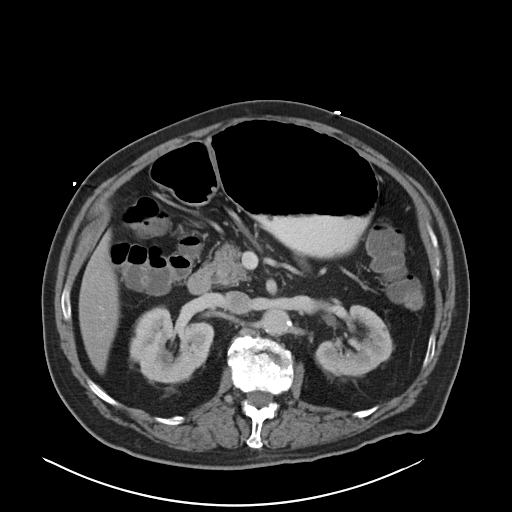
[im 68/97  soft-tissue]
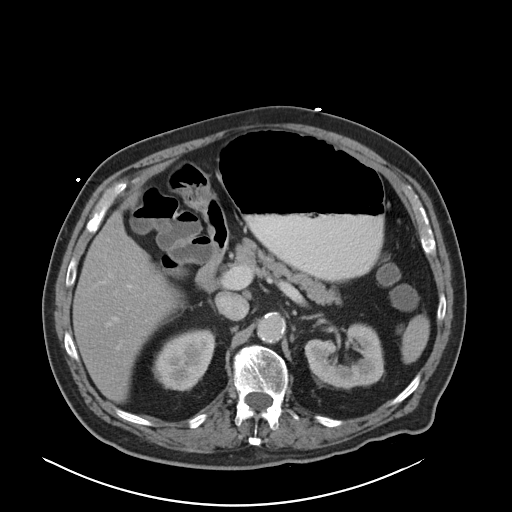
[im 68/97  bone]
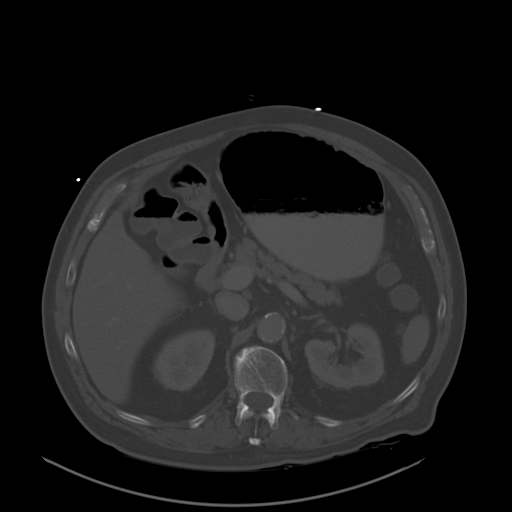
[im 74/97  soft-tissue]
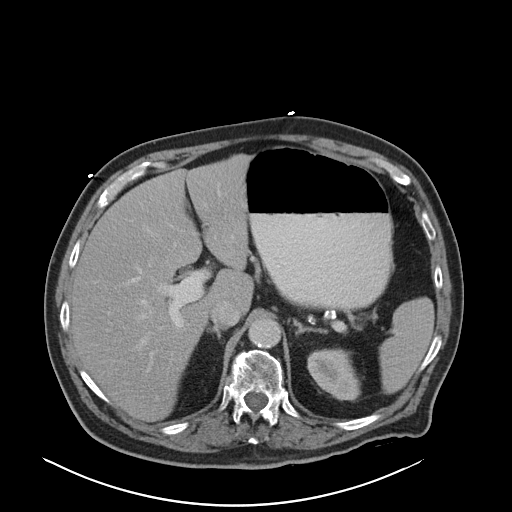
[im 85/97  soft-tissue]
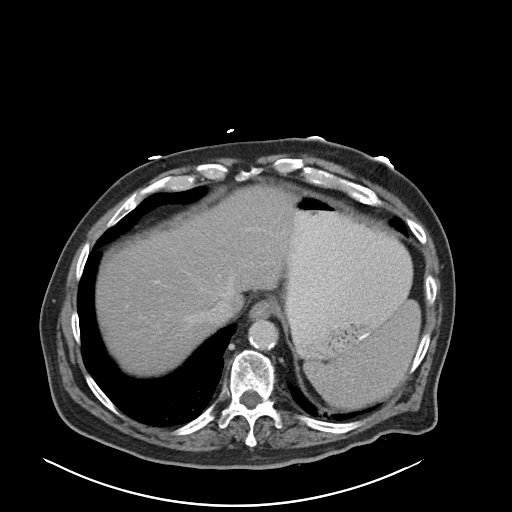
[im 91/97  soft-tissue]
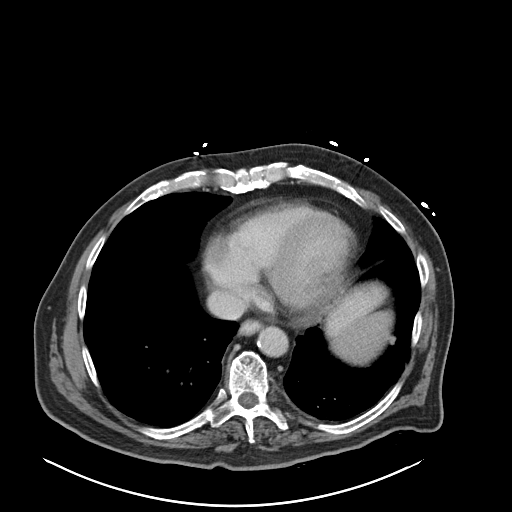

[Series 5: coronal soft tissue · coronal · 0.81mm/px · 3 of 108 slices shown]
[im 36/108  soft-tissue]
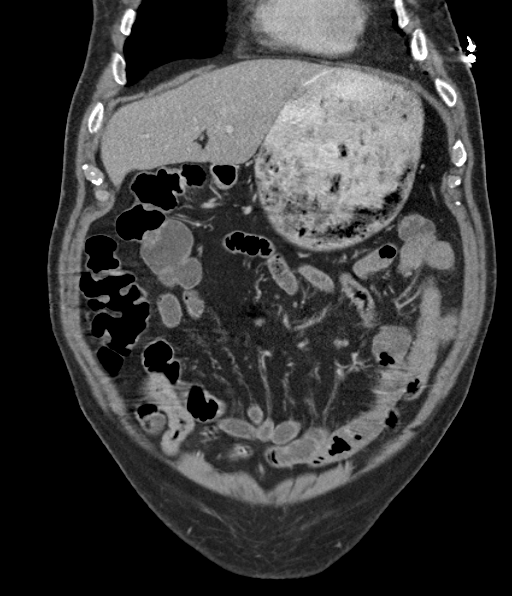
[im 48/108  soft-tissue]
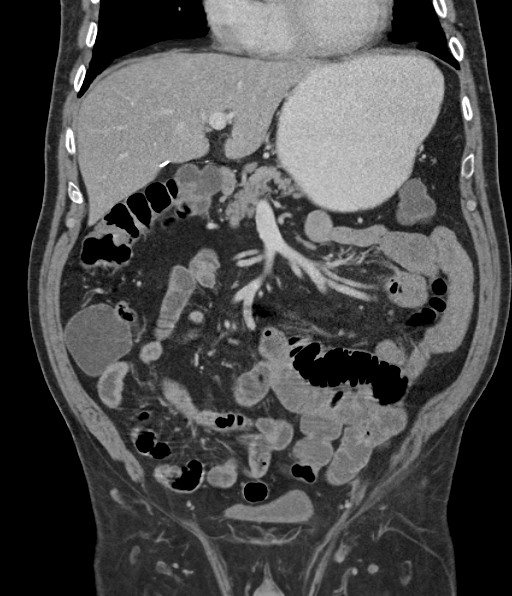
[im 60/108  soft-tissue]
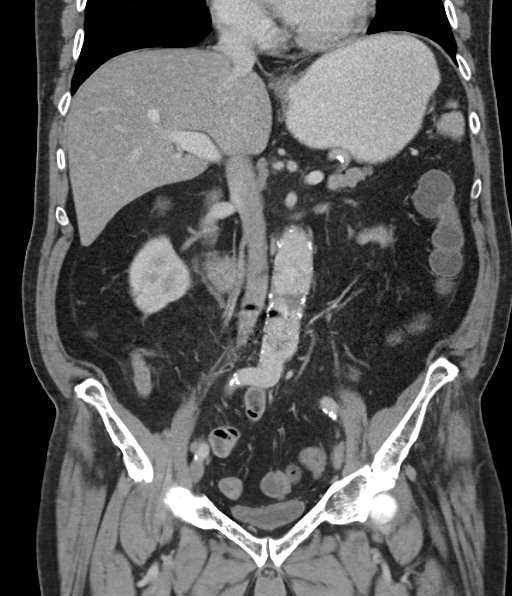

[15 of 46 positions shown; findings below may reference images not displayed]

FINDINGS: LUNG BASES: Stable 9 mm pleural-based LEFT lower lobe nodule. Heart
is mildly enlarged, trace pericardial effusion.

SOLID ORGANS: The liver, spleen, pancreas and adrenal glands are
unremarkable. Status post cholecystectomy. 9 mm hypodensity and tail
of the pancreas go, as reported on prior MRI. Adrenal glands are
unremarkable.

GASTROINTESTINAL TRACT: The stomach, small and large bowel are
normal in course and caliber without inflammatory changes. Fluid
filled small large bowel. Normal appendix.

KIDNEYS/ URINARY TRACT: Kidneys are orthotopic, demonstrating
symmetric enhancement. No nephrolithiasis, hydronephrosis or solid
renal masses. Focal scarring LEFT lower pole coarse calcification,
unchanged. 2.9 cm RIGHT interpolar cyst, previously characterized on
MR. 3 mm LEFT lower pole, punctate LEFT interpolar nephrolithiasis.
Too small to characterize hypodensities in kidneys bilaterally. The
unopacified ureters are normal in course and caliber. Delayed
imaging through the kidneys demonstrates symmetric prompt contrast
excretion within the proximal urinary collecting system. Urinary
bladder is partially distended and unremarkable.

PERITONEUM/RETROPERITONEUM: 3.1 x 3.7 cm infrarenal aortic aneurysm,
similar to prior examination, status post apparent aortoiliac
surgical intervention. Moderate to severe calcific atherosclerosis
of the aortoiliac vessels with probable occlusion of RIGHT internal
Common iliac artery. No lymphadenopathy by CT size criteria.
Prostate is upper limits of normal in size. No intraperitoneal free
fluid nor free air.

SOFT TISSUE/OSSEOUS STRUCTURES: Non-suspicious. Small fat containing
LEFT inguinal hernia. Status post L3-4 and L4-5 PLIF. Stable
degenerative change of lumbar spine.
IMPRESSION: Fluid filled nondistended small and large bowel may reflect
enteritis. No bowel obstruction.

Re demonstration of subcentimeter pancreatic tail cystic mass, for
prior recommendation, follow-up again recommended.

Nonobstructing LEFT nephrolithiasis measure up to 3 mm.

Stable 3.1 x 3.7 cm infrarenal aortic aneurysm, status post apparent
aortoiliac surgical repair appearing Recommend followup by
ultrasound in 2 years. This recommendation follows ACR consensus
guidelines: White Paper of the ACR Incidental Findings Committee II

  By: Amaral Nunes Gaspar Haxter-Bettendorff

## 2016-09-27 ENCOUNTER — Other Ambulatory Visit: Payer: Self-pay | Admitting: Interventional Cardiology

## 2016-09-27 NOTE — Telephone Encounter (Signed)
Spoke with pt and he has been taking his Xarelto QD as ordered.  Pt has been getting samples from his doctors due to cost.  Ok to refill.

## 2016-09-27 NOTE — Telephone Encounter (Signed)
Sending to you as an FYI. See below note indicating that the patient has not refilled this since 02/06/16. Okay to go ahead and refill? Please advise. Thanks, MI  Medication Notes      ATKINS, [place]  Sat Apr 27, 2016 1:01 PM (EDT) Pt has samples from MD  Payton Doughty  Sat Apr 27, 2016 12:41 PM (EDT) #30 filled 02/06/16 - pt has 4 refills per [facility] pharmacy but has not requested more.  Eartha Inch  Fri Apr 26, 2016 3:56 PM (EDT) Last filled 02/06/16

## 2016-10-07 ENCOUNTER — Telehealth: Payer: Self-pay | Admitting: Interventional Cardiology

## 2016-10-07 NOTE — Telephone Encounter (Signed)
Patient aware that we are currently out of xarelto 20 mg samples. He will check back later in the week.

## 2016-10-07 NOTE — Telephone Encounter (Signed)
New message  Pt is requesting samples of Xarelto '20mg'$   Wants to pick up on 12/14 Thursday

## 2016-10-10 ENCOUNTER — Telehealth: Payer: Self-pay

## 2016-10-10 NOTE — Telephone Encounter (Signed)
Samples of Xarelto '20mg'$  2 bottles provided to patient.

## 2016-11-16 ENCOUNTER — Other Ambulatory Visit: Payer: Self-pay | Admitting: Interventional Cardiology

## 2016-12-16 IMAGING — CR DG RIBS W/ CHEST 3+V*R*
3 series · 3 of 3 positions shown · non-contrast
Comparison: Portable chest x-ray December 11, 2014

CLINICAL DATA: Right anterior rib pain following a fall 1 week ago,
history of coronary artery disease with stent placement, diabetes,
remote history of tobacco use.

EXAM:
RIGHT RIBS AND CHEST - 3+ VIEW

[w chest pa]
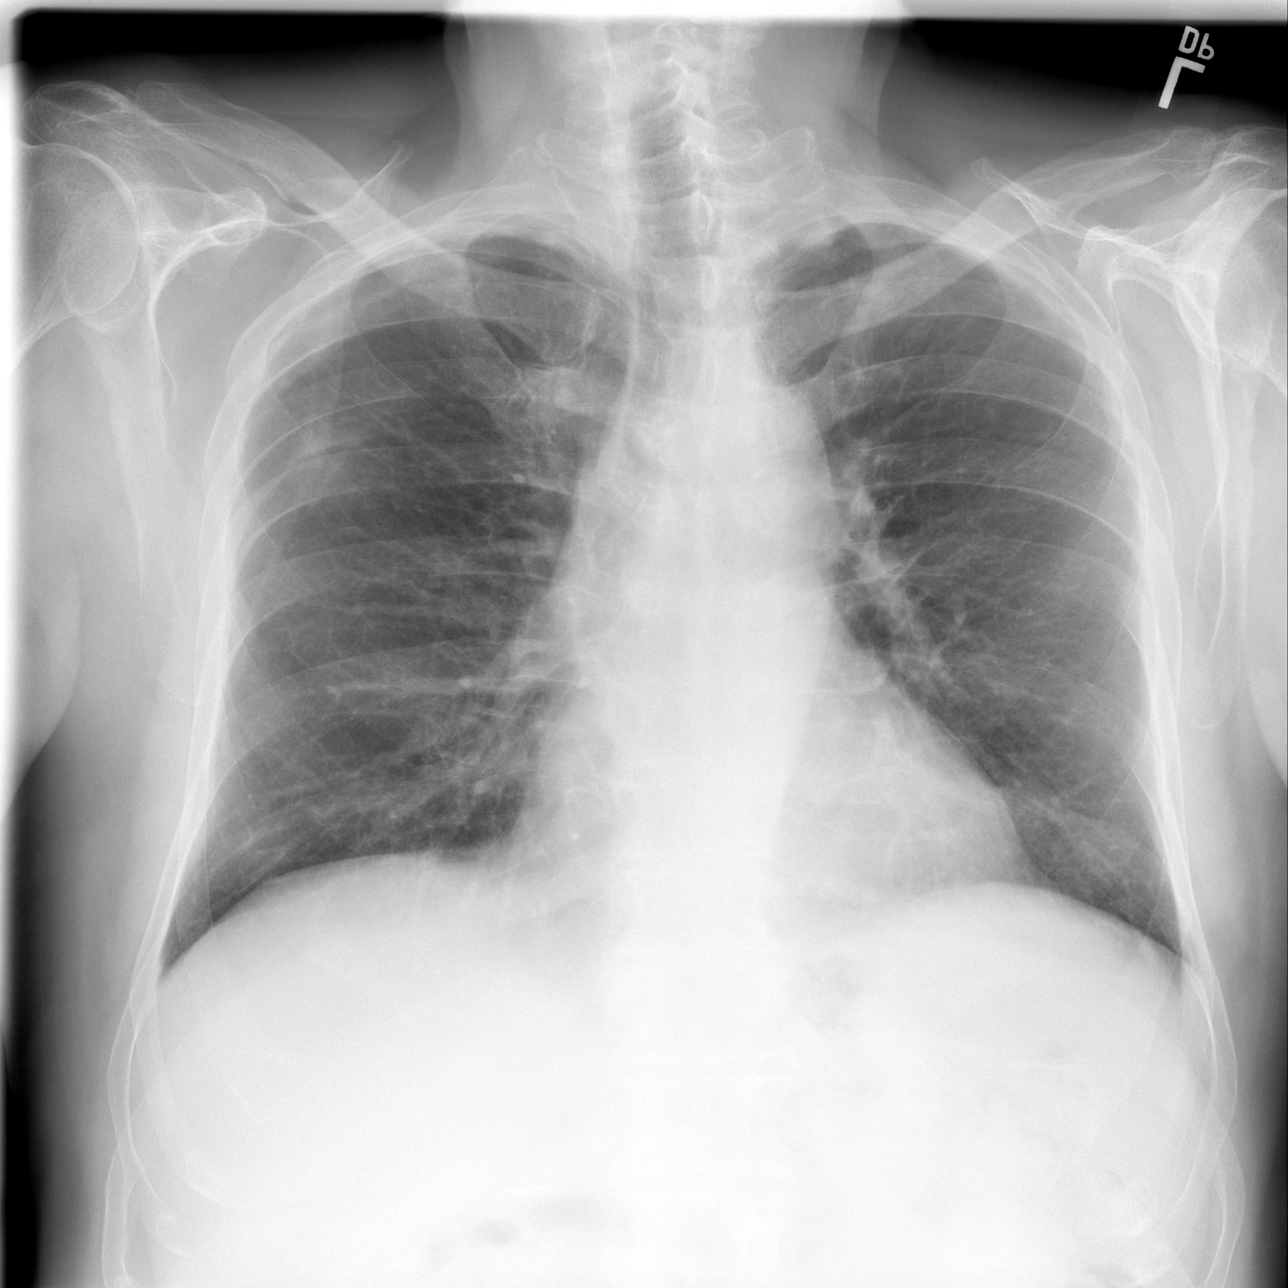

[w ribs ap/pa lower right *]
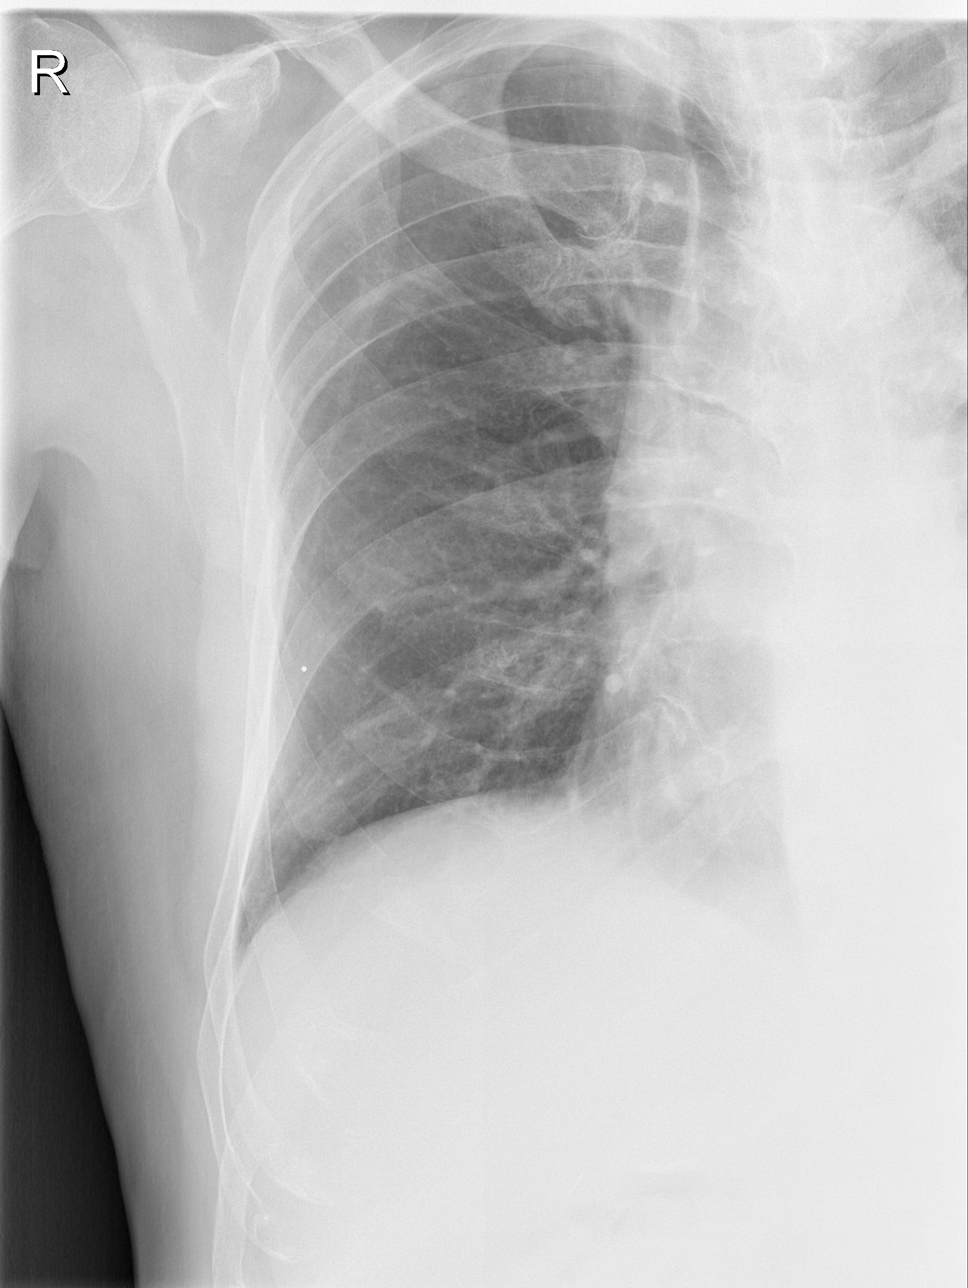

[w ribs oblique right *]
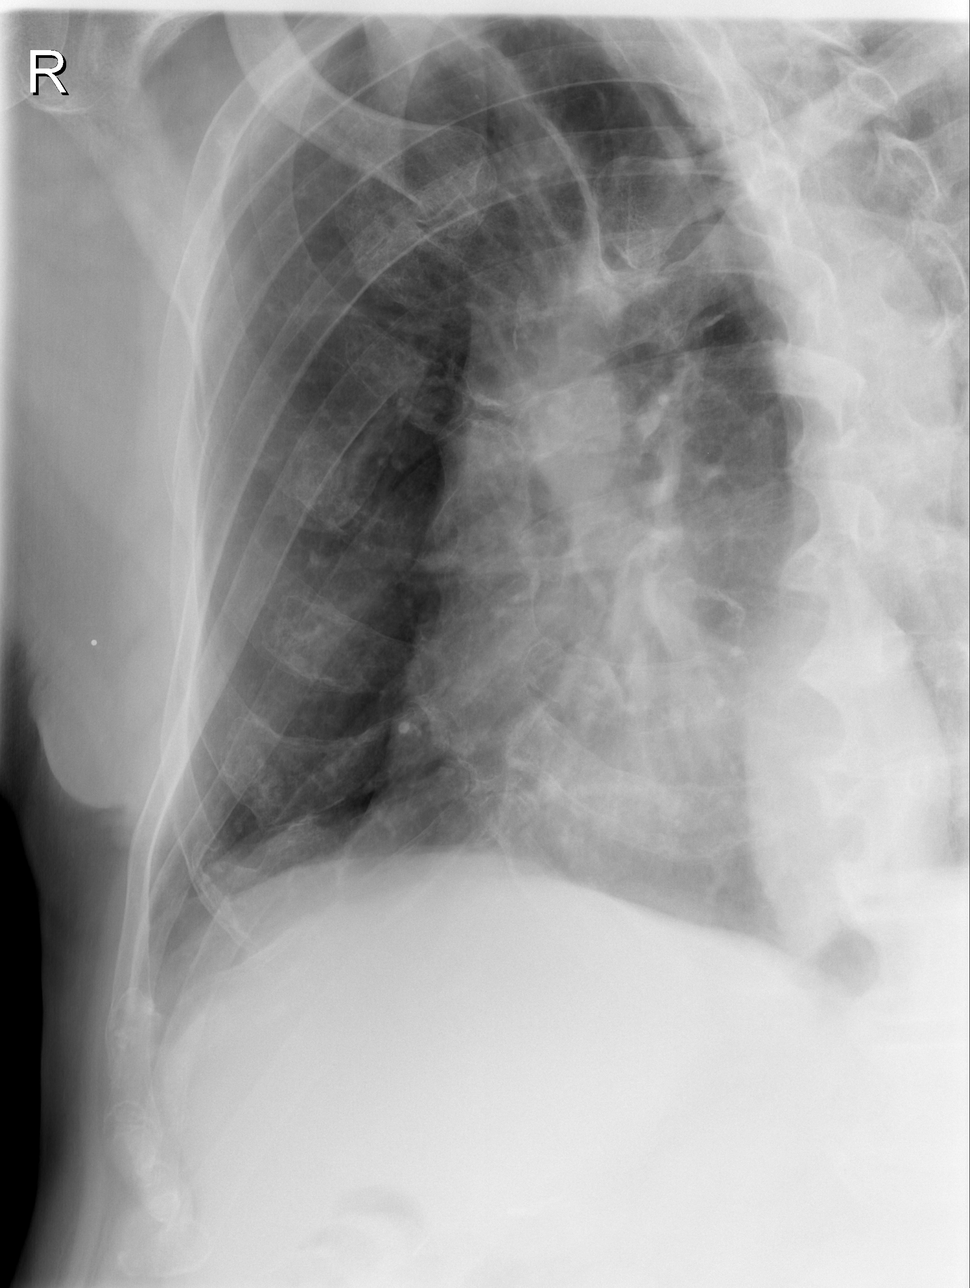

[3 of 3 positions shown; findings below may reference images not displayed]

FINDINGS: The lungs are adequately inflated. There is subtle increased density
in the right upper lobe. There is no pneumothorax nor pleural
effusion. The heart and mediastinal structures exhibit no acute
abnormalities. There is mild tortuosity of the descending thoracic
aorta.

A metallic BB has been placed over the right anterior-lateral mid
thorax. There is a minimally displaced fracture of the lateral
aspect of the fifth rib. No other rib fractures are observed.
IMPRESSION: 1. Minimally displaced fracture of the lateral aspect of the right
fifth rib. There is no pneumothorax or pleural effusion.
2. Possible subtle pulmonary contusion in the right upper lobe.
Otherwise there is no acute cardiopulmonary abnormality.

## 2016-12-21 ENCOUNTER — Emergency Department (HOSPITAL_COMMUNITY): Payer: Medicare Other

## 2016-12-21 ENCOUNTER — Inpatient Hospital Stay (HOSPITAL_COMMUNITY)
Admission: EM | Admit: 2016-12-21 | Discharge: 2016-12-26 | DRG: 391 | Disposition: A | Discharge status: Home / Self Care | Payer: Medicare Other | Attending: Internal Medicine | Admitting: Internal Medicine

## 2016-12-21 ENCOUNTER — Encounter (HOSPITAL_COMMUNITY): Payer: Self-pay

## 2016-12-21 DIAGNOSIS — E1140 Type 2 diabetes mellitus with diabetic neuropathy, unspecified: Secondary | ICD-10-CM | POA: Diagnosis present

## 2016-12-21 DIAGNOSIS — R197 Diarrhea, unspecified: Secondary | ICD-10-CM | POA: Diagnosis not present

## 2016-12-21 DIAGNOSIS — I4891 Unspecified atrial fibrillation: Secondary | ICD-10-CM | POA: Diagnosis not present

## 2016-12-21 DIAGNOSIS — E1165 Type 2 diabetes mellitus with hyperglycemia: Secondary | ICD-10-CM | POA: Diagnosis present

## 2016-12-21 DIAGNOSIS — Z79899 Other long term (current) drug therapy: Secondary | ICD-10-CM

## 2016-12-21 DIAGNOSIS — I2510 Atherosclerotic heart disease of native coronary artery without angina pectoris: Secondary | ICD-10-CM | POA: Diagnosis present

## 2016-12-21 DIAGNOSIS — Z888 Allergy status to other drugs, medicaments and biological substances status: Secondary | ICD-10-CM

## 2016-12-21 DIAGNOSIS — Z7901 Long term (current) use of anticoagulants: Secondary | ICD-10-CM

## 2016-12-21 DIAGNOSIS — IMO0002 Reserved for concepts with insufficient information to code with codable children: Secondary | ICD-10-CM | POA: Diagnosis present

## 2016-12-21 DIAGNOSIS — Z7984 Long term (current) use of oral hypoglycemic drugs: Secondary | ICD-10-CM

## 2016-12-21 DIAGNOSIS — I255 Ischemic cardiomyopathy: Secondary | ICD-10-CM | POA: Diagnosis present

## 2016-12-21 DIAGNOSIS — J690 Pneumonitis due to inhalation of food and vomit: Secondary | ICD-10-CM | POA: Diagnosis present

## 2016-12-21 DIAGNOSIS — A0811 Acute gastroenteropathy due to Norwalk agent: Secondary | ICD-10-CM | POA: Diagnosis not present

## 2016-12-21 DIAGNOSIS — A084 Viral intestinal infection, unspecified: Secondary | ICD-10-CM | POA: Diagnosis not present

## 2016-12-21 DIAGNOSIS — M479 Spondylosis, unspecified: Secondary | ICD-10-CM | POA: Diagnosis present

## 2016-12-21 DIAGNOSIS — Z8249 Family history of ischemic heart disease and other diseases of the circulatory system: Secondary | ICD-10-CM

## 2016-12-21 DIAGNOSIS — E785 Hyperlipidemia, unspecified: Secondary | ICD-10-CM | POA: Diagnosis present

## 2016-12-21 DIAGNOSIS — G4733 Obstructive sleep apnea (adult) (pediatric): Secondary | ICD-10-CM | POA: Diagnosis present

## 2016-12-21 DIAGNOSIS — E1142 Type 2 diabetes mellitus with diabetic polyneuropathy: Secondary | ICD-10-CM | POA: Diagnosis present

## 2016-12-21 DIAGNOSIS — Z8261 Family history of arthritis: Secondary | ICD-10-CM

## 2016-12-21 DIAGNOSIS — E7800 Pure hypercholesterolemia, unspecified: Secondary | ICD-10-CM | POA: Diagnosis present

## 2016-12-21 DIAGNOSIS — I5042 Chronic combined systolic (congestive) and diastolic (congestive) heart failure: Secondary | ICD-10-CM | POA: Diagnosis not present

## 2016-12-21 DIAGNOSIS — I480 Paroxysmal atrial fibrillation: Secondary | ICD-10-CM | POA: Diagnosis not present

## 2016-12-21 DIAGNOSIS — R0902 Hypoxemia: Secondary | ICD-10-CM

## 2016-12-21 DIAGNOSIS — R112 Nausea with vomiting, unspecified: Secondary | ICD-10-CM | POA: Diagnosis not present

## 2016-12-21 DIAGNOSIS — E1149 Type 2 diabetes mellitus with other diabetic neurological complication: Secondary | ICD-10-CM | POA: Diagnosis present

## 2016-12-21 DIAGNOSIS — Z955 Presence of coronary angioplasty implant and graft: Secondary | ICD-10-CM

## 2016-12-21 DIAGNOSIS — R509 Fever, unspecified: Secondary | ICD-10-CM

## 2016-12-21 DIAGNOSIS — I110 Hypertensive heart disease with heart failure: Secondary | ICD-10-CM | POA: Diagnosis present

## 2016-12-21 DIAGNOSIS — J189 Pneumonia, unspecified organism: Secondary | ICD-10-CM

## 2016-12-21 DIAGNOSIS — I481 Persistent atrial fibrillation: Secondary | ICD-10-CM | POA: Diagnosis present

## 2016-12-21 DIAGNOSIS — I252 Old myocardial infarction: Secondary | ICD-10-CM

## 2016-12-21 DIAGNOSIS — Z833 Family history of diabetes mellitus: Secondary | ICD-10-CM

## 2016-12-21 DIAGNOSIS — Z8679 Personal history of other diseases of the circulatory system: Secondary | ICD-10-CM

## 2016-12-21 DIAGNOSIS — J181 Lobar pneumonia, unspecified organism: Secondary | ICD-10-CM

## 2016-12-21 DIAGNOSIS — K219 Gastro-esophageal reflux disease without esophagitis: Secondary | ICD-10-CM | POA: Diagnosis present

## 2016-12-21 HISTORY — DX: Chronic combined systolic (congestive) and diastolic (congestive) heart failure: I50.42

## 2016-12-21 HISTORY — DX: Atherosclerotic heart disease of native coronary artery without angina pectoris: I25.10

## 2016-12-21 HISTORY — DX: Ischemic cardiomyopathy: I25.5

## 2016-12-21 HISTORY — DX: Thrombocytopenia, unspecified: D69.6

## 2016-12-21 HISTORY — DX: Paroxysmal atrial fibrillation: I48.0

## 2016-12-21 LAB — CBC WITH DIFFERENTIAL/PLATELET
Basophils Absolute: 0 10*3/uL (ref 0.0–0.1)
Basophils Relative: 0 %
Eosinophils Absolute: 0 10*3/uL (ref 0.0–0.7)
Eosinophils Relative: 0 %
HCT: 45.4 % (ref 39.0–52.0)
Hemoglobin: 15.9 g/dL (ref 13.0–17.0)
Lymphocytes Relative: 6 %
Lymphs Abs: 0.5 10*3/uL — ABNORMAL LOW (ref 0.7–4.0)
MCH: 31.5 pg (ref 26.0–34.0)
MCHC: 35 g/dL (ref 30.0–36.0)
MCV: 89.9 fL (ref 78.0–100.0)
Monocytes Absolute: 0.2 10*3/uL (ref 0.1–1.0)
Monocytes Relative: 3 %
Neutro Abs: 6.9 10*3/uL (ref 1.7–7.7)
Neutrophils Relative %: 91 %
Platelets: 105 10*3/uL — ABNORMAL LOW (ref 150–400)
RBC: 5.05 MIL/uL (ref 4.22–5.81)
RDW: 13.2 % (ref 11.5–15.5)
WBC: 7.6 10*3/uL (ref 4.0–10.5)

## 2016-12-21 LAB — I-STAT CG4 LACTIC ACID, ED: Lactic Acid, Venous: 1.8 mmol/L (ref 0.5–1.9)

## 2016-12-21 LAB — COMPREHENSIVE METABOLIC PANEL
ALT: 22 U/L (ref 17–63)
AST: 26 U/L (ref 15–41)
Albumin: 3.9 g/dL (ref 3.5–5.0)
Alkaline Phosphatase: 54 U/L (ref 38–126)
Anion gap: 13 (ref 5–15)
BUN: 28 mg/dL — ABNORMAL HIGH (ref 6–20)
CO2: 19 mmol/L — ABNORMAL LOW (ref 22–32)
Calcium: 9.5 mg/dL (ref 8.9–10.3)
Chloride: 108 mmol/L (ref 101–111)
Creatinine, Ser: 0.86 mg/dL (ref 0.61–1.24)
GFR calc Af Amer: >60 mL/min (ref >60)
GFR calc non Af Amer: >60 mL/min (ref >60)
Glucose, Bld: 200 mg/dL — ABNORMAL HIGH (ref 65–99)
Potassium: 3.9 mmol/L (ref 3.5–5.1)
Sodium: 140 mmol/L (ref 135–145)
Total Bilirubin: 1 mg/dL (ref 0.3–1.2)
Total Protein: 6.9 g/dL (ref 6.5–8.1)

## 2016-12-21 LAB — LIPASE, BLOOD: Lipase: 28 U/L (ref 11–51)

## 2016-12-21 MED ORDER — PROMETHAZINE HCL 25 MG/ML IJ SOLN
6.2500 mg | Freq: Four times a day (QID) | INTRAMUSCULAR | Status: DC | PRN
  Administered 2016-12-22: 12.5 mg via INTRAVENOUS
  Filled 2016-12-21: qty 1

## 2016-12-21 MED ORDER — OXYCODONE-ACETAMINOPHEN 5-325 MG PO TABS: 1.0000 | ORAL_TABLET | Freq: Four times a day (QID) | ORAL | Status: DC | PRN

## 2016-12-21 MED ORDER — SODIUM CHLORIDE 0.9 % IV BOLUS (SEPSIS)
1000.0000 mL | Freq: Once | INTRAVENOUS | Status: AC
  Administered 2016-12-21: 1000 mL via INTRAVENOUS

## 2016-12-21 MED ORDER — ONDANSETRON HCL 4 MG/2ML IJ SOLN
4.0000 mg | Freq: Four times a day (QID) | INTRAMUSCULAR | Status: DC | PRN
  Administered 2016-12-22: 4 mg via INTRAVENOUS
  Filled 2016-12-21: qty 2

## 2016-12-21 MED ORDER — SODIUM CHLORIDE 0.9% FLUSH
3.0000 mL | Freq: Two times a day (BID) | INTRAVENOUS | Status: DC
  Administered 2016-12-22 – 2016-12-25 (×6): 3 mL via INTRAVENOUS

## 2016-12-21 MED ORDER — AMIODARONE HCL 200 MG PO TABS
200.0000 mg | ORAL_TABLET | Freq: Every day | ORAL | Status: DC
  Administered 2016-12-22 – 2016-12-23 (×2): 200 mg via ORAL
  Filled 2016-12-21 (×3): qty 1

## 2016-12-21 MED ORDER — PANTOPRAZOLE SODIUM 40 MG PO TBEC
40.0000 mg | DELAYED_RELEASE_TABLET | Freq: Every day | ORAL | Status: DC
  Administered 2016-12-22 – 2016-12-26 (×5): 40 mg via ORAL
  Filled 2016-12-21 (×5): qty 1

## 2016-12-21 MED ORDER — DIAZEPAM 5 MG PO TABS: 5.0000 mg | ORAL_TABLET | Freq: Two times a day (BID) | ORAL | Status: DC | PRN

## 2016-12-21 MED ORDER — HEPARIN (PORCINE) IN NACL 100-0.45 UNIT/ML-% IJ SOLN
1300.0000 [IU]/h | INTRAMUSCULAR | Status: DC
  Administered 2016-12-22: 1250 [IU]/h via INTRAVENOUS
  Administered 2016-12-23: 1300 [IU]/h via INTRAVENOUS
  Filled 2016-12-21 (×2): qty 250

## 2016-12-21 MED ORDER — PROMETHAZINE HCL 25 MG/ML IJ SOLN
6.2500 mg | Freq: Once | INTRAMUSCULAR | Status: AC
  Administered 2016-12-21: 6.25 mg via INTRAVENOUS
  Filled 2016-12-21: qty 1

## 2016-12-21 MED ORDER — ATORVASTATIN CALCIUM 10 MG PO TABS
10.0000 mg | ORAL_TABLET | Freq: Every day | ORAL | Status: DC
  Administered 2016-12-22 – 2016-12-25 (×4): 10 mg via ORAL
  Filled 2016-12-21 (×4): qty 1

## 2016-12-21 MED ORDER — ADULT MULTIVITAMIN W/MINERALS CH
1.0000 | ORAL_TABLET | Freq: Every day | ORAL | Status: DC
  Administered 2016-12-22 – 2016-12-26 (×5): 1 via ORAL
  Filled 2016-12-21 (×5): qty 1

## 2016-12-21 MED ORDER — SODIUM CHLORIDE 0.9 % IV SOLN
INTRAVENOUS | Status: DC
  Administered 2016-12-22: 01:00:00 via INTRAVENOUS

## 2016-12-21 MED ORDER — INSULIN ASPART 100 UNIT/ML ~~LOC~~ SOLN
0.0000 [IU] | SUBCUTANEOUS | Status: DC
  Administered 2016-12-22 (×3): 2 [IU] via SUBCUTANEOUS

## 2016-12-21 NOTE — ED Notes (Signed)
Radiology at bedside

## 2016-12-21 NOTE — Progress Notes (Signed)
Update: after 2nd L SBP now 100-110, HR 120s-130s.  Seems to be trending in correct direction.  Patient is laying completely flat without any respiratory distress to suggest fluid overload.  Will give 3L bolus.  No UOP yet.  Also ordering home CPAP.

## 2016-12-21 NOTE — H&P (Signed)
History and Physical    [name] DVV:[id number] DOB: 04/27/1941 DOA: 12/21/2016   PCP: Kristopher Shelling, MD Chief Complaint:  Chief Complaint  Patient presents with  . Influenza    HPI: [name] is a 76 y.o. male with medical history significant of PAF on xarelto and amiodarone, CAD, HTN.  Patient presents to the ED with c/o N/V/D.  Symptoms onset earlier today, found to have Tm 102.1 at home.  Feels weak all over.  Symptoms are severe with large amount of N/V/D.  No known sick contacts.  ED Course: BP 62I systolic, HR 948N initially.  Given 1 IVF bolus with only minimal improvement, 2nd liter NS going now at bolus rate.  Review of Systems: As per HPI otherwise 10 point review of systems negative.    Past Medical History:  Diagnosis Date  . AAA (abdominal aortic aneurysm) ([place])   . Acid reflux    takes Prilosec and Protonix daily  . Arthritis    BacK  . Complication of anesthesia    -years ago hair fell out.;ileus after 2 of his surgeries  . Coronary atherosclerosis of native coronary artery    ejection fraction of 40%, prior inferior myocardial infarction in February 1993, PTCA, LAD rotational atherectomy in August of 1998, acute inferolateral myocardial infarction, ventricular fibrillation in July of 1999, PTCA and stent of obtuse marginal 1, 100% RCA 1/12 with collaterals from patent circumflex and LAD  . Dry skin   . Dysrhythmia    atrial fibrillation  . Foot drop, bilateral   . Hypercholesteremia    takes lipitor daily  . Hypertension    takes Ramipril daily  . Ileus ([place])    After AAA  . Incisional hernia    "small, from AAA"  . MI (myocardial infarction)    1992 and 1997 sees Dr. Linard Millers as needed   . Neuropathy (HCC)    in feet & legs  . OSA on CPAP    uses CPAP--sleep study done at least 16yr ago  . Pain    back pain chronic- seen at pain clinic  . Plantar fasciitis    bilatetral  . Type II diabetes mellitus ([facility]     Past Surgical History:    Procedure Laterality Date  . ABDOMINAL AORTIC ANEURYSM REPAIR    . BACK SURGERY  2012-2013 X 3   Miminal Invasive x 3in [place].  .Marland KitchenCARDIAC CATHETERIZATION  2009/2012  . CARDIOVERSION N/A 04/12/2015   Procedure: CARDIOVERSION;  Surgeon: TSueanne Margarita MD;  [facility];  Service: Cardiovascular;  Laterality: N/A;  . COLONOSCOPY    . CORONARY ANGIOPLASTY WITH STENT PLACEMENT  1992; 1997  . LAPAROSCOPIC CHOLECYSTECTOMY    . LUMBAR LAMINECTOMY/DECOMPRESSION MICRODISCECTOMY Right 01/16/2016   Procedure: Right Lumbar five-Sacral one Laminectomy;  Surgeon: HKristeen Miss MD;  [facility];  Service: Neurosurgery;  Laterality: Right;  Right L5-S1 Laminectomy     reports that he quit smoking about 24 years ago. His smoking use included Cigarettes. He has a 33.00 pack-year smoking history. He has never used smokeless tobacco. He reports that he drinks alcohol. He reports that he does not use drugs.  Allergies  Allergen Reactions  . Cymbalta [Duloxetine Hcl] Nausea And Vomiting and Other (See Comments)    Rapid drop in blood pressure  . Neurontin [Gabapentin] Other (See Comments)    Makes me goofy, keeps me off balance, clouds my thinking  . Lyrica [Pregabalin] Other (See Comments)    Makes  me goofy, keeps me off balance, clouds my thinking  . Keppra [Levetiracetam] Nausea And Vomiting    Family History  Problem Relation Age of Onset  . Diabetes Mother   . Coronary artery disease Mother   . Diabetes Father   . Arthritis Father   . Rheum arthritis Father   . Coronary artery disease Father       Prior to Admission medications   Medication Sig Start Date End Date Taking? Authorizing Provider  amiodarone (PACERONE) 200 MG tablet TAKE ONE TABLET BY MOUTH ONCE DAILY 04/29/16  Yes Belva Crome, MD  atorvastatin (LIPITOR) 10 MG tablet Take 10 mg by mouth daily.   Yes Historical Provider, MD  Coenzyme Q10 (CO Q 10) 100 MG CAPS Take 100 mg by mouth daily.   Yes Historical  Provider, MD  diazepam (VALIUM) 5 MG tablet Take 5 mg by mouth 2 (two) times daily as needed (leg cramps/ sleep).  10/31/15  Yes Historical Provider, MD  empagliflozin (JARDIANCE) 10 MG TABS tablet Take 10 mg by mouth daily with breakfast.    Yes Historical Provider, MD  glimepiride (AMARYL) 2 MG tablet Take 2 mg by mouth daily with breakfast.  01/10/15  Yes Historical Provider, MD  metFORMIN (GLUCOPHAGE-XR) 500 MG 24 hr tablet Take 1,000 mg by mouth 2 (two) times daily.  10/12/15  Yes Historical Provider, MD  metoprolol (LOPRESSOR) 50 MG tablet TAKE ONE TABLET BY MOUTH TWICE DAILY 11/18/16  Yes Belva Crome, MD  Multiple Vitamin (MULTIVITAMIN WITH MINERALS) TABS Take 1 tablet by mouth daily.   Yes Historical Provider, MD  omeprazole (PRILOSEC) 20 MG capsule Take 20 mg by mouth daily. 09/14/12  Yes Historical Provider, MD  oxyCODONE-acetaminophen (PERCOCET/ROXICET) 5-325 MG tablet Take 1 tablet by mouth every 6 (six) hours as needed (pain).    Yes Historical Provider, MD  pyridOXINE (VITAMIN B-6) 100 MG tablet Take 100 mg by mouth daily.   Yes Historical Provider, MD  vitamin B-12 (CYANOCOBALAMIN) 1000 MCG tablet Take 1,000 mcg by mouth daily.    Yes Historical Provider, MD  XARELTO 20 MG TABS tablet TAKE ONE TABLET BY MOUTH ONCE DAILY WITH SUPPER 09/27/16  Yes Belva Crome, MD    Physical Exam: Vitals:   12/21/16 2110 12/21/16 2130 12/21/16 2140 12/21/16 2151  BP: 95/56 1'07/63 95/65 93/76 '$  Pulse: (!) 137 (!) 131 (!) 125 (!) 146  Resp: 26 22 (!) 29 (!) 27  Temp:      TempSrc:      SpO2: 97% 98% 97% 97%  Weight:      Height:          Constitutional: NAD, calm, comfortable Eyes: PERRL, lids and conjunctivae normal ENMT: Mucous membranes are moist. Posterior pharynx clear of any exudate or lesions.Normal dentition.  Neck: normal, supple, no masses, no thyromegaly Respiratory: clear to auscultation bilaterally, no wheezing, no crackles. Normal respiratory effort. No accessory muscle use.    Cardiovascular: Irr, irr, tachycardic Abdomen: no tenderness, no masses palpated. No hepatosplenomegaly. Bowel sounds positive.  Musculoskeletal: no clubbing / cyanosis. No joint deformity upper and lower extremities. Good ROM, no contractures. Normal muscle tone.  Skin: no rashes, lesions, ulcers. No induration Neurologic: CN 2-12 grossly intact. Sensation intact, DTR normal. Strength 5/5 in all 4.  Psychiatric: Normal judgment and insight. Alert and oriented x 3. Normal mood.    Labs on Admission: I have personally reviewed following labs and imaging studies  CBC:  Recent Labs Lab 12/21/16 1847  WBC  7.6  NEUTROABS 6.9  HGB 15.9  HCT 45.4  MCV 89.9  PLT 580*   Basic Metabolic Panel:  Recent Labs Lab 12/21/16 1847  NA 140  K 3.9  CL 108  CO2 19*  GLUCOSE 200*  BUN 28*  CREATININE 0.86  CALCIUM 9.5   GFR: Estimated Creatinine Clearance: 81.3 mL/min (by C-G formula based on SCr of 0.86 mg/dL). Liver Function Tests:  Recent Labs Lab 12/21/16 1847  AST 26  ALT 22  ALKPHOS 54  BILITOT 1.0  PROT 6.9  ALBUMIN 3.9    Recent Labs Lab 12/21/16 1847  LIPASE 28   No results for input(s): AMMONIA in the last 168 hours. Coagulation Profile: No results for input(s): INR, PROTIME in the last 168 hours. Cardiac Enzymes: No results for input(s): CKTOTAL, CKMB, CKMBINDEX, TROPONINI in the last 168 hours. BNP (last 3 results) No results for input(s): PROBNP in the last 8760 hours. HbA1C: No results for input(s): HGBA1C in the last 72 hours. CBG: No results for input(s): GLUCAP in the last 168 hours. Lipid Profile: No results for input(s): CHOL, HDL, LDLCALC, TRIG, CHOLHDL, LDLDIRECT in the last 72 hours. Thyroid Function Tests: No results for input(s): TSH, T4TOTAL, FREET4, T3FREE, THYROIDAB in the last 72 hours. Anemia Panel: No results for input(s): VITAMINB12, FOLATE, FERRITIN, TIBC, IRON, RETICCTPCT in the last 72 hours. Urine analysis:    Component Value  Date/Time   COLORURINE YELLOW 04/26/2016 0844   APPEARANCEUR CLEAR 04/26/2016 0844   LABSPEC 1.031 (H) 04/26/2016 0844   PHURINE 5.5 04/26/2016 0844   GLUCOSEU >1000 (A) 04/26/2016 0844   HGBUR SMALL (A) 04/26/2016 0844   BILIRUBINUR NEGATIVE 04/26/2016 0844   KETONESUR 15 (A) 04/26/2016 0844   PROTEINUR NEGATIVE 04/26/2016 0844   UROBILINOGEN 0.2 12/12/2014 0150   NITRITE NEGATIVE 04/26/2016 0844   LEUKOCYTESUR NEGATIVE 04/26/2016 0844   Sepsis Labs: '@LABRCNTIP'$ (procalcitonin:4,lacticidven:4) )No results found for this or any previous visit (from the past 240 hour(s)).   Radiological Exams on Admission: Dg Chest Portable 1 View  Result Date: 12/21/2016 CLINICAL DATA:  Nausea and vomiting. EXAM: PORTABLE CHEST 1 VIEW COMPARISON:  04/26/16 FINDINGS: Mild cardiac enlargement. Lung volumes are low. Atelectasis is identified within the left lower lobe. No airspace opacities identified. IMPRESSION: 1. Left lower lobe atelectasis Electronically Signed   By: Kerby Moors M.D.   On: 12/21/2016 19:41    EKG: Independently reviewed.  Assessment/Plan Principal Problem:   Gastroenteritis and colitis, viral Active Problems:   DM (diabetes mellitus), type 2, uncontrolled w/neurologic complication (HCC)   Paroxysmal atrial fibrillation with RVR (HCC)   Chronic combined systolic and diastolic heart failure (HCC)   Nausea vomiting and diarrhea    1. N/V/D presumably due to infectious gastroenteritis -  1. IVF, IVF, IVF: 2nd L bolus in ED going now, 125 cc/hr maint ordered, may need additional L boluses depending on response to 2nd L 2. zofran and phenergan PRN nausea 3. GI pathogen panel 4. No recent ABx to suggest C.Diff at this time, nor risk factors, nor smell of C.Diff 2. PAF with RVR - 1. Giving IVF to see if this improves BP and / or HR, hopefully it will as his chronic amiodarone he takes should still be theraputic given the extremely long half life of this med 2. If BP improves but  RVR persists, then will use cardizem gtt 3. If neither BP nor HR improve, then will need to call cards: re: ?digoxin or other med? 4. Hold metoprolol for low  BP 3. DM2 - 1. Hold PO hypoglycemics 2. Sensitive scale SSI Q4H   DVT prophylaxis: Heparin gtt Code Status: Full Family Communication: Family at bedside Consults called: None Admission status: Place in [place], [place] [facility] Pager [phone] from 7PM-7AM  If 7AM-7PM, please contact the day physician for the patient [email or web address] Password [facility]  12/21/2016, 9:54 PM

## 2016-12-21 NOTE — ED Provider Notes (Signed)
[facility] DEPT Provider Note   CSN: [id number] Arrival date & time: 12/21/16  1828     History   Chief Complaint Chief Complaint  Patient presents with  . Influenza    HPI [name] is a 76 y.o. male.  HPI Patient resents with nausea vomiting diarrhea. Began earlier today. Found to have a fever. Feels general weakness. Had temperature 102.1 at home. States it feels weak all over and almost passed out. Has had watery diarrhea 2. History of atrial fibrillation and is on Xarelto. Chadsvasc of 6 with everything except stroke history. No known sick contacts. No dysuria. He states he is not normally in atrial fibrillation.   Past Medical History:  Diagnosis Date  . AAA (abdominal aortic aneurysm) ([place])   . Acid reflux    takes Prilosec and Protonix daily  . Arthritis    BacK  . Complication of anesthesia    -years ago hair fell out.;ileus after 2 of his surgeries  . Coronary atherosclerosis of native coronary artery    ejection fraction of 40%, prior inferior myocardial infarction in February 1993, PTCA, LAD rotational atherectomy in August of 1998, acute inferolateral myocardial infarction, ventricular fibrillation in July of 1999, PTCA and stent of obtuse marginal 1, 100% RCA 1/12 with collaterals from patent circumflex and LAD  . Dry skin   . Dysrhythmia    atrial fibrillation  . Foot drop, bilateral   . Hypercholesteremia    takes lipitor daily  . Hypertension    takes Ramipril daily  . Ileus ([place])    After AAA  . Incisional hernia    "small, from AAA"  . MI (myocardial infarction)    1992 and 1997 sees Dr. Linard Millers as needed   . Neuropathy (HCC)    in feet & legs  . OSA on CPAP    uses CPAP--sleep study done at least 25yr ago  . Pain    back pain chronic- seen at pain clinic  . Plantar fasciitis    bilatetral  . Type II diabetes mellitus ([facility]     Patient Active Problem List   Diagnosis Date Noted  . Gastroenteritis and colitis, viral 04/27/2016  .  Thrombocytopenia ([facility] 04/27/2016  . Orthostasis 04/26/2016  . Nausea vomiting and diarrhea 04/26/2016  . AKI (acute kidney injury) ([facility] 04/26/2016  . Lumbar radiculopathy, chronic 01/16/2016  . Hematoma of left kidney 11/01/2015  . Diabetes mellitus type 2, controlled ([facility] [place] 12/12/2014  . Hyperlipidemia 12/12/2014  . SIRS (systemic inflammatory response syndrome) ([facility] 12/12/2014  . Influenza due to identified novel influenza A virus with other respiratory manifestations 11/07/2013  . Long term current use of anticoagulant therapy 09/08/2013    Class: Chronic  . Coronary atherosclerosis of native coronary artery 08/16/2013    Class: Chronic  . Chronic combined systolic and diastolic heart failure (HCC) 08/16/2013    Class: Chronic  . Paroxysmal atrial fibrillation with RVR ([facility] [place] 08/13/2013  . Lumbar pseudoarthrosis 10/13/2012  . Paralytic ileus ([facility] [place] 05/07/2012  . Nausea and vomiting 01/01/2012  . DM (diabetes mellitus), type 2, uncontrolled w/neurologic complication ([facility] 078/29/5621 . Hypertension   . AAA (abdominal aortic aneurysm) ([facility]   . Acid reflux   . Neuropathy ([facility]   . Sleep apnea     Past Surgical History:  Procedure Laterality Date  . ABDOMINAL AORTIC ANEURYSM REPAIR    . BACK SURGERY  2012-2013 X 3   Miminal Invasive x 3in [place].  .Marland KitchenCARDIAC CATHETERIZATION  2009/2012  .  CARDIOVERSION N/A 04/12/2015   Procedure: CARDIOVERSION;  Surgeon: Sueanne Margarita, MD;  [facility];  Service: Cardiovascular;  Laterality: N/A;  . COLONOSCOPY    . CORONARY ANGIOPLASTY WITH STENT PLACEMENT  1992; 1997  . LAPAROSCOPIC CHOLECYSTECTOMY    . LUMBAR LAMINECTOMY/DECOMPRESSION MICRODISCECTOMY Right 01/16/2016   Procedure: Right Lumbar five-Sacral one Laminectomy;  Surgeon: Kristeen Miss, MD;  Location: [facility];  Service: Neurosurgery;  Laterality: Right;  Right L5-S1 Laminectomy       Home Medications    Prior to Admission medications   Medication Sig Start  Date End Date Taking? Authorizing Provider  amiodarone (PACERONE) 200 MG tablet TAKE ONE TABLET BY MOUTH ONCE DAILY 04/29/16  Yes Belva Crome, MD  atorvastatin (LIPITOR) 10 MG tablet Take 10 mg by mouth daily.   Yes Historical Provider, MD  Coenzyme Q10 (CO Q 10) 100 MG CAPS Take 100 mg by mouth daily.   Yes Historical Provider, MD  diazepam (VALIUM) 5 MG tablet Take 5 mg by mouth 2 (two) times daily as needed (leg cramps/ sleep).  10/31/15  Yes Historical Provider, MD  empagliflozin (JARDIANCE) 10 MG TABS tablet Take 10 mg by mouth daily with breakfast.    Yes Historical Provider, MD  glimepiride (AMARYL) 2 MG tablet Take 2 mg by mouth daily with breakfast.  01/10/15  Yes Historical Provider, MD  metFORMIN (GLUCOPHAGE-XR) 500 MG 24 hr tablet Take 1,000 mg by mouth 2 (two) times daily.  10/12/15  Yes Historical Provider, MD  metoprolol (LOPRESSOR) 50 MG tablet TAKE ONE TABLET BY MOUTH TWICE DAILY 11/18/16  Yes Belva Crome, MD  Multiple Vitamin (MULTIVITAMIN WITH MINERALS) TABS Take 1 tablet by mouth daily.   Yes Historical Provider, MD  omeprazole (PRILOSEC) 20 MG capsule Take 20 mg by mouth daily. 09/14/12  Yes Historical Provider, MD  oxyCODONE-acetaminophen (PERCOCET/ROXICET) 5-325 MG tablet Take 1 tablet by mouth every 6 (six) hours as needed (pain).    Yes Historical Provider, MD  pyridOXINE (VITAMIN B-6) 100 MG tablet Take 100 mg by mouth daily.   Yes Historical Provider, MD  vitamin B-12 (CYANOCOBALAMIN) 1000 MCG tablet Take 1,000 mcg by mouth daily.    Yes Historical Provider, MD  XARELTO 20 MG TABS tablet TAKE ONE TABLET BY MOUTH ONCE DAILY WITH SUPPER 09/27/16  Yes Belva Crome, MD    Family History Family History  Problem Relation Age of Onset  . Diabetes Mother   . Coronary artery disease Mother   . Diabetes Father   . Arthritis Father   . Rheum arthritis Father   . Coronary artery disease Father     Social History Social History  Substance Use Topics  . Smoking status:  Former Smoker    Packs/day: 1.00    Years: 33.00    Types: Cigarettes    Quit date: 01/01/1992  . Smokeless tobacco: Never Used  . Alcohol use Yes     Comment: 6/30;2017 "GLASS OF WINE Q COUPLE MONTHS, IF THAT"     Allergies   Cymbalta [duloxetine hcl]; Neurontin [gabapentin]; Lyrica [pregabalin]; and Keppra [levetiracetam]   Review of Systems Review of Systems  Constitutional: Positive for appetite change and fever.  HENT: Negative for congestion.   Respiratory: Negative for apnea.   Cardiovascular: Positive for palpitations.  Gastrointestinal: Positive for diarrhea, nausea and vomiting. Negative for abdominal pain.  Genitourinary: Negative for difficulty urinating.  Musculoskeletal: Positive for myalgias.  Skin: Negative for pallor and wound.  Neurological: Positive for light-headedness.  Hematological: Negative for adenopathy.  Psychiatric/Behavioral: Negative for confusion.     Physical Exam Updated Vital Signs BP 103/66   Pulse (!) 127   Temp 100 F (37.8 C) (Oral)   Resp (!) 27   Ht '6\' 1"'$  (1.854 m)   Wt 202 lb (91.6 kg)   SpO2 97%   BMI 26.65 kg/m   Physical Exam  Constitutional: He appears well-developed.  HENT:  Head: Normocephalic.  Eyes: EOM are normal.  Neck: Neck supple.  Cardiovascular:  Irregular tachycardia  Pulmonary/Chest: Effort normal.  Abdominal: Soft.  Musculoskeletal: He exhibits no edema.  Neurological: He is alert.  Skin: Skin is warm.  Nursing note and vitals reviewed.    ED Treatments / Results  Labs (all labs ordered are listed, but only abnormal results are displayed) Labs Reviewed  COMPREHENSIVE METABOLIC PANEL - Abnormal; Notable for the following:       Result Value   CO2 19 (*)    Glucose, Bld 200 (*)    BUN 28 (*)    All other components within normal limits  CBC WITH DIFFERENTIAL/PLATELET - Abnormal; Notable for the following:    Platelets 105 (*)    Lymphs Abs 0.5 (*)    All other components within normal  limits  GASTROINTESTINAL PANEL BY PCR, STOOL (REPLACES STOOL CULTURE)  LIPASE, BLOOD  URINALYSIS, ROUTINE W REFLEX MICROSCOPIC  HEPARIN LEVEL (UNFRACTIONATED)  APTT  CBC  I-STAT CG4 LACTIC ACID, ED    EKG  EKG Interpretation  Date/Time:  Saturday December 21 2016 18:40:53 EST Ventricular Rate:  159 PR Interval:    QRS Duration: 108 QT Interval:  305 QTC Calculation: 497 R Axis:   -70 Text Interpretation:  Atrial fibrillation with rapid V-rate Abnormal R-wave progression, early transition Inferior infarct, old Confirmed by Alvino Chapel  MD, Loraina Stauffer [phone]) on 12/21/2016 8:06:56 PM       Radiology Dg Chest Portable 1 View  Result Date: 12/21/2016 CLINICAL DATA:  Nausea and vomiting. EXAM: PORTABLE CHEST 1 VIEW COMPARISON:  04/26/16 FINDINGS: Mild cardiac enlargement. Lung volumes are low. Atelectasis is identified within the left lower lobe. No airspace opacities identified. IMPRESSION: 1. Left lower lobe atelectasis Electronically Signed   By: Kerby Moors M.D.   On: 12/21/2016 19:41    Procedures Procedures (including critical care time)  Medications Ordered in ED Medications  pantoprazole (PROTONIX) EC tablet 40 mg (not administered)  atorvastatin (LIPITOR) tablet 10 mg (not administered)  amiodarone (PACERONE) tablet 200 mg (not administered)  diazepam (VALIUM) tablet 5 mg (not administered)  multivitamin with minerals tablet 1 tablet (not administered)  oxyCODONE-acetaminophen (PERCOCET/ROXICET) 5-325 MG per tablet 1 tablet (not administered)  ondansetron (ZOFRAN) injection 4 mg (not administered)  promethazine (PHENERGAN) injection 6.25-12.5 mg (not administered)  0.9 %  sodium chloride infusion (not administered)  insulin aspart (novoLOG) injection 0-9 Units (not administered)  heparin ADULT infusion 100 units/mL (25000 units/2104m sodium chloride 0.45%) (not administered)  sodium chloride flush (NS) 0.9 % injection 3 mL (not administered)  sodium chloride 0.9 % bolus  1,000 mL (1,000 mLs Intravenous New Bag/Given 12/21/16 1855)  promethazine (PHENERGAN) injection 6.25 mg (6.25 mg Intravenous Given 12/21/16 1856)  sodium chloride 0.9 % bolus 1,000 mL (1,000 mLs Intravenous New Bag/Given 12/21/16 2153)  sodium chloride 0.9 % bolus 1,000 mL (1,000 mLs Intravenous New Bag/Given 12/21/16 2255)     Initial Impression / Assessment and Plan / ED Course  I have reviewed the triage vital signs and the nursing notes.  Pertinent labs &  imaging results that were available during my care of the patient were reviewed by me and considered in my medical decision making (see chart for details).     Patient with nausea vomiting diarrhea. Also has atrial fibrillation with RVR. History of A. fib. Heart rate is improved somewhat with some IV fluids. However he is somewhat hypotensive and does not appear to have any rate control medications at this time. He is on amiodarone. Will admit to internal medicine dose likely step down bed.  CRITICAL CARE Performed by: Mackie Pai Total critical care time: 30 minutes Critical care time was exclusive of separately billable procedures and treating other patients. Critical care was necessary to treat or prevent imminent or life-threatening deterioration. Critical care was time spent personally by me on the following activities: development of treatment plan with patient and/or surrogate as well as nursing, discussions with consultants, evaluation of patient's response to treatment, examination of patient, obtaining history from patient or surrogate, ordering and performing treatments and interventions, ordering and review of laboratory studies, ordering and review of radiographic studies, pulse oximetry and re-evaluation of patient's condition.   Final Clinical Impressions(s) / ED Diagnoses   Final diagnoses:  Nausea vomiting and diarrhea  Atrial fibrillation with rapid ventricular response [facility])    New Prescriptions New  Prescriptions   No medications on file     Davonna Belling, MD 12/21/16 2323

## 2016-12-21 NOTE — ED Triage Notes (Signed)
Pt from home with EMS for nausea and vomiting sudden onset today at noon. Pt had a temp of 102.1 at home.

## 2016-12-21 NOTE — Progress Notes (Signed)
ANTICOAGULATION CONSULT NOTE - Initial Consult  Pharmacy Consult for Heparin Indication: atrial fibrillation  Allergies  Allergen Reactions  . Cymbalta [Duloxetine Hcl] Nausea And Vomiting and Other (See Comments)    Rapid drop in blood pressure  . Neurontin [Gabapentin] Other (See Comments)    Makes me goofy, keeps me off balance, clouds my thinking  . Lyrica [Pregabalin] Other (See Comments)    Makes me goofy, keeps me off balance, clouds my thinking  . Keppra [Levetiracetam] Nausea And Vomiting    Patient Measurements: Height: '6\' 1"'$  (185.4 cm) Weight: 202 lb (91.6 kg) IBW/kg (Calculated) : 79.9 Heparin Dosing Weight: 91 kg  Vital Signs: Temp: 100 F (37.8 C) (02/24 1859) Temp Source: Oral (02/24 1859) BP: 107/63 (02/24 2130) Pulse Rate: 131 (02/24 2130)  Labs:  Recent Labs  12/21/16 1847  HGB 15.9  HCT 45.4  PLT 105*  CREATININE 0.86    Estimated Creatinine Clearance: 81.3 mL/min (by C-G formula based on SCr of 0.86 mg/dL).   Medical History: Past Medical History:  Diagnosis Date  . AAA (abdominal aortic aneurysm) ([place])   . Acid reflux    takes Prilosec and Protonix daily  . Arthritis    BacK  . Complication of anesthesia    -years ago hair fell out.;ileus after 2 of his surgeries  . Coronary atherosclerosis of native coronary artery    ejection fraction of 40%, prior inferior myocardial infarction in February 1993, PTCA, LAD rotational atherectomy in August of 1998, acute inferolateral myocardial infarction, ventricular fibrillation in July of 1999, PTCA and stent of obtuse marginal 1, 100% RCA 1/12 with collaterals from patent circumflex and LAD  . Dry skin   . Dysrhythmia    atrial fibrillation  . Foot drop, bilateral   . Hypercholesteremia    takes lipitor daily  . Hypertension    takes Ramipril daily  . Ileus ([place])    After AAA  . Incisional hernia    "small, from AAA"  . MI (myocardial infarction)    1992 and 1997 sees Dr. Linard Millers as  needed   . Neuropathy (HCC)    in feet & legs  . OSA on CPAP    uses CPAP--sleep study done at least 78yr ago  . Pain    back pain chronic- seen at pain clinic  . Plantar fasciitis    bilatetral  . Type II diabetes mellitus (HCC)     Medications:  See electronic med rec  Assessment: 76y.o. M presents with N/V/fever. Pt on Xarelto PTA for afib - last dose 2/24 ~1800. Pt now with N/V so transitioning to heparin gtt for now. H/H ok at baseline. Plt 105 (low but stable for this patient). Xarelto will likely be affecting heparin level so will utilize PTT for monitoring until heparin level and PTT correlating.  Goal of Therapy:  Heparin level 0.3-0.7 units/ml; PTT 66-102 sec Monitor platelets by anticoagulation protocol: Yes   Plan:  Baseline heparin level, PTT in a.m. Start heparin 1250 units/hr with no bolus at 2/25 1800 (24 hr post last Xarelto dose) Will f/u PTT 8 hr after heparin starts Daily heparin level, PTT, and CBC  CSherlon Handing PharmD, BCPS Clinical pharmacist, pager [phone]/24/2018,9:47 PM

## 2016-12-21 NOTE — ED Notes (Signed)
In incontinent of stool pt cleaned and sheets changed.

## 2016-12-22 ENCOUNTER — Observation Stay (HOSPITAL_COMMUNITY): Payer: Medicare Other

## 2016-12-22 DIAGNOSIS — E1165 Type 2 diabetes mellitus with hyperglycemia: Secondary | ICD-10-CM | POA: Diagnosis not present

## 2016-12-22 DIAGNOSIS — I480 Paroxysmal atrial fibrillation: Secondary | ICD-10-CM | POA: Diagnosis not present

## 2016-12-22 DIAGNOSIS — I5042 Chronic combined systolic (congestive) and diastolic (congestive) heart failure: Secondary | ICD-10-CM | POA: Diagnosis not present

## 2016-12-22 DIAGNOSIS — E1140 Type 2 diabetes mellitus with diabetic neuropathy, unspecified: Secondary | ICD-10-CM | POA: Diagnosis not present

## 2016-12-22 DIAGNOSIS — A084 Viral intestinal infection, unspecified: Secondary | ICD-10-CM | POA: Diagnosis not present

## 2016-12-22 DIAGNOSIS — J181 Lobar pneumonia, unspecified organism: Secondary | ICD-10-CM | POA: Diagnosis not present

## 2016-12-22 DIAGNOSIS — I4891 Unspecified atrial fibrillation: Secondary | ICD-10-CM | POA: Diagnosis not present

## 2016-12-22 LAB — URINALYSIS, ROUTINE W REFLEX MICROSCOPIC
Bacteria, UA: NONE SEEN
Bilirubin Urine: NEGATIVE
Glucose, UA: >=500 mg/dL — AB
Ketones, ur: 5 mg/dL — AB
Leukocytes, UA: NEGATIVE
Nitrite: NEGATIVE
Protein, ur: NEGATIVE mg/dL
Specific Gravity, Urine: 1.027 (ref 1.005–1.030)
pH: 5 (ref 5.0–8.0)

## 2016-12-22 LAB — BASIC METABOLIC PANEL
Anion gap: 9 (ref 5–15)
BUN: 24 mg/dL — ABNORMAL HIGH (ref 6–20)
CO2: 23 mmol/L (ref 22–32)
Calcium: 8.5 mg/dL — ABNORMAL LOW (ref 8.9–10.3)
Chloride: 105 mmol/L (ref 101–111)
Creatinine, Ser: 1.04 mg/dL (ref 0.61–1.24)
GFR calc Af Amer: >60 mL/min (ref >60)
GFR calc non Af Amer: >60 mL/min (ref >60)
Glucose, Bld: 182 mg/dL — ABNORMAL HIGH (ref 65–99)
Potassium: 3.8 mmol/L (ref 3.5–5.1)
Sodium: 137 mmol/L (ref 135–145)

## 2016-12-22 LAB — GLUCOSE, CAPILLARY
Glucose-Capillary: 160 mg/dL — ABNORMAL HIGH (ref 65–99)
Glucose-Capillary: 192 mg/dL — ABNORMAL HIGH (ref 65–99)
Glucose-Capillary: 159 mg/dL — ABNORMAL HIGH (ref 65–99)
Glucose-Capillary: 164 mg/dL — ABNORMAL HIGH (ref 65–99)
Glucose-Capillary: 164 mg/dL — ABNORMAL HIGH (ref 65–99)
Glucose-Capillary: 241 mg/dL — ABNORMAL HIGH (ref 65–99)

## 2016-12-22 LAB — CBC WITH DIFFERENTIAL/PLATELET
Basophils Absolute: 0 10*3/uL (ref 0.0–0.1)
Basophils Relative: 0 %
Eosinophils Absolute: 0 10*3/uL (ref 0.0–0.7)
Eosinophils Relative: 0 %
HCT: 39.4 % (ref 39.0–52.0)
Hemoglobin: 13.8 g/dL (ref 13.0–17.0)
Lymphocytes Relative: 11 %
Lymphs Abs: 1 10*3/uL (ref 0.7–4.0)
MCH: 31.7 pg (ref 26.0–34.0)
MCHC: 35 g/dL (ref 30.0–36.0)
MCV: 90.6 fL (ref 78.0–100.0)
Monocytes Absolute: 0.9 10*3/uL (ref 0.1–1.0)
Monocytes Relative: 10 %
Neutro Abs: 6.9 10*3/uL (ref 1.7–7.7)
Neutrophils Relative %: 79 %
Platelets: 99 10*3/uL — ABNORMAL LOW (ref 150–400)
RBC: 4.35 MIL/uL (ref 4.22–5.81)
RDW: 13.5 % (ref 11.5–15.5)
WBC: 8.8 10*3/uL (ref 4.0–10.5)

## 2016-12-22 LAB — CBC
HCT: 42.2 % (ref 39.0–52.0)
Hemoglobin: 14.5 g/dL (ref 13.0–17.0)
MCH: 31.3 pg (ref 26.0–34.0)
MCHC: 34.4 g/dL (ref 30.0–36.0)
MCV: 91.1 fL (ref 78.0–100.0)
Platelets: 108 10*3/uL — ABNORMAL LOW (ref 150–400)
RBC: 4.63 MIL/uL (ref 4.22–5.81)
RDW: 13.5 % (ref 11.5–15.5)
WBC: 6.5 10*3/uL (ref 4.0–10.5)

## 2016-12-22 LAB — INFLUENZA PANEL BY PCR (TYPE A & B)
Influenza A By PCR: NEGATIVE
Influenza B By PCR: NEGATIVE

## 2016-12-22 LAB — C DIFFICILE QUICK SCREEN W PCR REFLEX
C Diff antigen: NEGATIVE
C Diff interpretation: NOT DETECTED
C Diff toxin: NEGATIVE

## 2016-12-22 LAB — TROPONIN I: Troponin I: 0.03 ng/mL (ref <0.03)

## 2016-12-22 LAB — APTT: aPTT: 33 seconds (ref 24–36)

## 2016-12-22 LAB — MAGNESIUM: Magnesium: 1.6 mg/dL — ABNORMAL LOW (ref 1.7–2.4)

## 2016-12-22 LAB — BRAIN NATRIURETIC PEPTIDE: B Natriuretic Peptide: 272.9 pg/mL — ABNORMAL HIGH (ref 0.0–100.0)

## 2016-12-22 LAB — LACTIC ACID, PLASMA: Lactic Acid, Venous: 1.7 mmol/L (ref 0.5–1.9)

## 2016-12-22 LAB — MRSA PCR SCREENING: MRSA by PCR: NEGATIVE

## 2016-12-22 LAB — HEPARIN LEVEL (UNFRACTIONATED): Heparin Unfractionated: 0.61 IU/mL (ref 0.30–0.70)

## 2016-12-22 MED ORDER — METOPROLOL TARTRATE 25 MG PO TABS
25.0000 mg | ORAL_TABLET | Freq: Two times a day (BID) | ORAL | Status: DC
  Administered 2016-12-22 – 2016-12-24 (×5): 25 mg via ORAL
  Filled 2016-12-22 (×5): qty 1

## 2016-12-22 MED ORDER — DEXTROSE 5 % IV SOLN
5.0000 mg/h | INTRAVENOUS | Status: DC
  Administered 2016-12-22: 15 mg/h via INTRAVENOUS
  Administered 2016-12-22: 5 mg/h via INTRAVENOUS
  Administered 2016-12-22: 15 mg/h via INTRAVENOUS
  Filled 2016-12-22 (×5): qty 100

## 2016-12-22 MED ORDER — ACETAMINOPHEN 325 MG PO TABS
650.0000 mg | ORAL_TABLET | Freq: Four times a day (QID) | ORAL | Status: DC | PRN
  Administered 2016-12-22: 650 mg via ORAL
  Filled 2016-12-22: qty 2

## 2016-12-22 MED ORDER — VANCOMYCIN HCL IN DEXTROSE 1-5 GM/200ML-% IV SOLN
1000.0000 mg | Freq: Two times a day (BID) | INTRAVENOUS | Status: DC
  Administered 2016-12-23 – 2016-12-24 (×3): 1000 mg via INTRAVENOUS
  Filled 2016-12-22 (×3): qty 200

## 2016-12-22 MED ORDER — ACETAMINOPHEN 325 MG PO TABS
650.0000 mg | ORAL_TABLET | ORAL | Status: DC | PRN
  Administered 2016-12-22: 650 mg via ORAL
  Filled 2016-12-22: qty 2

## 2016-12-22 MED ORDER — INSULIN ASPART 100 UNIT/ML ~~LOC~~ SOLN
0.0000 [IU] | Freq: Three times a day (TID) | SUBCUTANEOUS | Status: DC
Start: 2016-12-22 — End: 2016-12-26
  Administered 2016-12-22 – 2016-12-23 (×3): 3 [IU] via SUBCUTANEOUS
  Administered 2016-12-23 – 2016-12-24 (×2): 5 [IU] via SUBCUTANEOUS
  Administered 2016-12-24: 11 [IU] via SUBCUTANEOUS
  Administered 2016-12-24 – 2016-12-25 (×3): 5 [IU] via SUBCUTANEOUS
  Administered 2016-12-25: 3 [IU] via SUBCUTANEOUS
  Administered 2016-12-26: 5 [IU] via SUBCUTANEOUS

## 2016-12-22 MED ORDER — METOPROLOL TARTRATE 5 MG/5ML IV SOLN
5.0000 mg | INTRAVENOUS | Status: AC
  Administered 2016-12-22: 5 mg via INTRAVENOUS

## 2016-12-22 MED ORDER — METOPROLOL TARTRATE 5 MG/5ML IV SOLN
5.0000 mg | INTRAVENOUS | Status: AC
  Administered 2016-12-22 (×2): 5 mg via INTRAVENOUS
  Filled 2016-12-22: qty 5

## 2016-12-22 MED ORDER — PIPERACILLIN-TAZOBACTAM 3.375 G IVPB 30 MIN
3.3750 g | Freq: Once | INTRAVENOUS | Status: AC
  Administered 2016-12-22: 3.375 g via INTRAVENOUS
  Filled 2016-12-22: qty 50

## 2016-12-22 MED ORDER — METOPROLOL TARTRATE 5 MG/5ML IV SOLN: 5.0000 mg | Freq: Once | INTRAVENOUS | Status: DC

## 2016-12-22 MED ORDER — LEVOFLOXACIN IN D5W 750 MG/150ML IV SOLN
750.0000 mg | INTRAVENOUS | Status: DC
  Administered 2016-12-22 – 2016-12-24 (×3): 750 mg via INTRAVENOUS
  Filled 2016-12-22 (×3): qty 150

## 2016-12-22 MED ORDER — METOPROLOL TARTRATE 5 MG/5ML IV SOLN
INTRAVENOUS | Status: AC
Start: 2016-12-22 — End: 2016-12-22
  Administered 2016-12-22: 5 mg via INTRAVENOUS
  Filled 2016-12-22: qty 5

## 2016-12-22 MED ORDER — MAGNESIUM SULFATE IN D5W 1-5 GM/100ML-% IV SOLN
1.0000 g | INTRAVENOUS | Status: AC
Start: 2016-12-22 — End: 2016-12-22
  Administered 2016-12-22: 1 g via INTRAVENOUS
  Filled 2016-12-22: qty 100

## 2016-12-22 MED ORDER — MAGNESIUM SULFATE IN D5W 1-5 GM/100ML-% IV SOLN
1.0000 g | INTRAVENOUS | Status: AC
  Administered 2016-12-22: 1 g via INTRAVENOUS
  Filled 2016-12-22: qty 100

## 2016-12-22 MED ORDER — PIPERACILLIN-TAZOBACTAM 3.375 G IVPB
3.3750 g | Freq: Three times a day (TID) | INTRAVENOUS | Status: DC
  Administered 2016-12-23 – 2016-12-25 (×8): 3.375 g via INTRAVENOUS
  Filled 2016-12-22 (×9): qty 50

## 2016-12-22 MED ORDER — VANCOMYCIN HCL 10 G IV SOLR
2000.0000 mg | Freq: Once | INTRAVENOUS | Status: AC
  Administered 2016-12-22: 2000 mg via INTRAVENOUS
  Filled 2016-12-22: qty 2000

## 2016-12-22 NOTE — Progress Notes (Signed)
Pt placed on his home cpap at this time since he is sleeping on and off and o2 sats are hanging around 88-90%. Pt also breaths thru is mouth when he sleeps. O2 sats now 95% on his Cpap. Will continue to monitor.

## 2016-12-22 NOTE — Progress Notes (Signed)
Patient placed himself on home unit for the night with oxygen set at 2lpm

## 2016-12-22 NOTE — Progress Notes (Addendum)
[facility] PROGRESS NOTE  [name] XLK:[id number] DOB: 08-18-1941 DOA: 12/21/2016  PCP: Irven Shelling, MD  Brief History/Interval Summary: 76 year old Caucasian male with a past medical history of paroxysmal atrial fibrillation on amiodarone and was on anticoagulation, history of coronary artery disease, hypertension, presented with complaints of nausea, vomiting and diarrhea. Patient was noted to be hypotensive and tachycardic. He was in atrial fibrillation with RVR. He was hospitalized for further management.  Reason for Visit: Atrial fibrillation with RVR. Gastroenteritis  Consultants: None  Procedures: None  Antibiotics: None  Subjective/Interval History: Patient feels better. Had one episode of loose stool this morning. No further episodes of nausea or vomiting. He denies any chest pain or shortness of breath.  ROS: Denies any cough.  Objective:  Vital Signs  Vitals:   12/22/16 0700 12/22/16 0800 12/22/16 0815 12/22/16 0900  BP: (!) 111/54 111/64  114/65  Pulse: 93 (!) 102  89  Resp: (!) 22  20   Temp:   98.3 F (36.8 C)   TempSrc:   Oral   SpO2: 97% 98%  98%  Weight:      Height:        Intake/Output Summary (Last 24 hours) at 12/22/16 1045 Last data filed at 12/22/16 0700  Gross per 24 hour  Intake           160.33 ml  Output             1450 ml  Net         -1289.67 ml   Filed Weights   12/21/16 1835 12/22/16 0106 12/22/16 0228  Weight: 91.6 kg (202 lb) 97.9 kg (215 lb 13.3 oz) 96.8 kg (213 lb 6.5 oz)    General appearance: alert, cooperative, appears stated age and no distress Resp: clear to auscultation bilaterally Cardio: S1 S2 is irregularly irregular. No S3, S4. No rubs, murmurs, or bruit. No pedal edema. GI: soft, non-tender; bowel sounds normal; no masses,  no organomegaly Extremities: extremities normal, atraumatic, no cyanosis or edema Neurological: Patient is awake, alert. No facial asymmetry. Moving all his extremities.  Motor strength is equal bilaterally.  Lab Results:  Data Reviewed: I have personally reviewed following labs and imaging studies  CBC:  Recent Labs Lab 12/21/16 1847 12/22/16 0248  WBC 7.6 6.5  NEUTROABS 6.9  --   HGB 15.9 14.5  HCT 45.4 42.2  MCV 89.9 91.1  PLT 105* 108*    Basic Metabolic Panel:  Recent Labs Lab 12/21/16 1847 12/22/16 0248 12/22/16 0859  NA 140  --  137  K 3.9  --  3.8  CL 108  --  105  CO2 19*  --  23  GLUCOSE 200*  --  182*  BUN 28*  --  24*  CREATININE 0.86  --  1.04  CALCIUM 9.5  --  8.5*  MG  --  1.6*  --     GFR: Estimated Creatinine Clearance: 71.1 mL/min (by C-G formula based on SCr of 1.04 mg/dL).  Liver Function Tests:  Recent Labs Lab 12/21/16 1847  AST 26  ALT 22  ALKPHOS 54  BILITOT 1.0  PROT 6.9  ALBUMIN 3.9     Recent Labs Lab 12/21/16 1847  LIPASE 28   Cardiac Enzymes:  Recent Labs Lab 12/22/16 0259  TROPONINI 0.03*    CBG:  Recent Labs Lab 12/22/16 0142 12/22/16 0520 12/22/16 0828  GLUCAP 160* 192* 159*    Recent Results (from the past 240 hour(s))  MRSA  PCR Screening     Status: None   Collection Time: 12/22/16  1:01 AM  Result Value Ref Range Status   MRSA by PCR NEGATIVE NEGATIVE Final    Comment:        The GeneXpert MRSA Assay (FDA approved for NASAL specimens only), is one component of a comprehensive MRSA colonization surveillance program. It is not intended to diagnose MRSA infection nor to guide or monitor treatment for MRSA infections.   C difficile quick scan w PCR reflex     Status: None   Collection Time: 12/22/16  8:45 AM  Result Value Ref Range Status   C Diff antigen NEGATIVE NEGATIVE Final   C Diff toxin NEGATIVE NEGATIVE Final   C Diff interpretation No C. difficile detected.  Final      Radiology Studies: Dg Chest Portable 1 View  Result Date: 12/21/2016 CLINICAL DATA:  Nausea and vomiting. EXAM: PORTABLE CHEST 1 VIEW COMPARISON:  04/26/16 FINDINGS: Mild  cardiac enlargement. Lung volumes are low. Atelectasis is identified within the left lower lobe. No airspace opacities identified. IMPRESSION: 1. Left lower lobe atelectasis Electronically Signed   By: Kerby Moors M.D.   On: 12/21/2016 19:41   Dg Chest Port 1v Same Day  Result Date: 12/22/2016 CLINICAL DATA:  Initial evaluation for acute hypoxia. EXAM: PORTABLE CHEST 1 VIEW COMPARISON:  Prior radiograph from 12/21/2016. FINDINGS: Moderate cardiomegaly. Mediastinal silhouette within normal limits. Aortic atherosclerosis. Lungs hypoinflated. Persistent patchy opacity at the left lung base, either atelectasis or infiltrate. Mild perihilar vascular congestion without overt pulmonary edema. No pleural effusion. No pneumothorax. Osseous structures unchanged. IMPRESSION: 1. Persistent patchy left basilar opacity, which may reflect atelectasis and/or infiltrate. 2. Stable cardiomegaly with mild perihilar vascular congestion without overt pulmonary edema. 3. Aortic atherosclerosis. Electronically Signed   By: Jeannine Boga M.D.   On: 12/22/2016 03:03     Medications:  Scheduled: . amiodarone  200 mg Oral Daily  . atorvastatin  10 mg Oral q1800  . insulin aspart  0-9 Units Subcutaneous Q4H  . multivitamin with minerals  1 tablet Oral Daily  . pantoprazole  40 mg Oral Daily  . sodium chloride flush  3 mL Intravenous Q12H   Continuous: . diltiazem (CARDIZEM) infusion 15 mg/hr (12/22/16 0923)  . heparin     LGX:QJJHERDE, ondansetron (ZOFRAN) IV, oxyCODONE-acetaminophen, promethazine  Assessment/Plan:  Principal Problem:   Gastroenteritis and colitis, viral Active Problems:   DM (diabetes mellitus), type 2, uncontrolled w/neurologic complication (HCC)   Paroxysmal atrial fibrillation with RVR (HCC)   Chronic combined systolic and diastolic heart failure (HCC)   Nausea vomiting and diarrhea    Atrial fibrillation with RVR Patient with known history of proximal atrial fibrillation. He  is followed by Dr. Tamala Julian. He is on amiodarone and anticoagulation with Xarelto at home. Patient's current episode appeared to have been triggered by hypovolemia and acute illness. He is currently on a Cardizem infusion. Heart rate is reasonably well controlled. His GI symptoms have improved. Blood pressures have improved. Continue to monitor for now. We will keep him on Cardizem infusion for now. Patient also on heparin infusion. May have to involve cardiology if he does not convert to sinus rhythm. Resume metoprolol at lower dose.  Acute gastroenteritis Probably viral. Symptoms have improved. Stool studies negative for C. difficile. Symptomatic treatment.  History of diabetes mellitus type 2. Continue sliding scale coverage. Monitor CBGs. Patient's oral medications are on hold.  History of Coronary artery disease Stable. Continue medications.  History of  chronic systolic CHF Ejection fraction was 40% based on echocardiogram from 2016. Caution with IV fluids. No evidence for volume overload at this time.  History of abdominal aortic aneurysm. This is being monitored in the outpatient setting.  DVT Prophylaxis: On IV heparin    Code Status: Full code  Family Communication: Discussed with patient. No family at bedside  Disposition Plan: Management as outlined above.    LOS: 0 days   [facility] Pager [phone] 12/22/2016, 10:45 AM  If 7PM-7AM, please contact night-coverage at [email or web address], password [facility]

## 2016-12-22 NOTE — Progress Notes (Signed)
CRITICAL VALUE ALERT  Critical value received:  Troponin 0.03  Date of notification:  12/22/2016  Time of notification:  0418  Critical value read back:yes  Nurse who received alert:  Stefanie Libel, RN  MD notified (1st page): Dr.  Sheran Luz  Time of first page:  [phone]  Responding MD:  Dr. Alcario Drought  Time MD responded:  [phone]

## 2016-12-22 NOTE — Progress Notes (Signed)
Pharmacy Antibiotic Note [name] is a 76 y.o. male admitted on 12/21/2016 with pneumonia.  Pharmacy has been consulted for Zosyn, Levaquin and vancomycin dosing.  Plan: Vancomycin 1000 IV every 12 hours.  Goal trough 15-20 mcg/mL. Zosyn 3.375g IV q8h (4 hour infusion).  Levaquin 750 mg IV every 24 hours   Height: '6\' 3"'$  (190.5 cm) Weight: 213 lb 6.5 oz (96.8 kg) IBW/kg (Calculated) : 84.5  Temp (24hrs), Avg:99.4 F (37.4 C), Min:98 F (36.7 C), Max:102.6 F (39.2 C)   Recent Labs Lab 12/21/16 1847 12/21/16 2300 12/22/16 0248 12/22/16 0859 12/22/16 1638 12/22/16 1648  WBC 7.6  --  6.5  --  8.8  --   CREATININE 0.86  --   --  1.04  --   --   LATICACIDVEN  --  1.80  --   --   --  1.7    Estimated Creatinine Clearance: 71.1 mL/min (by C-G formula based on SCr of 1.04 mg/dL).    Allergies  Allergen Reactions  . Cymbalta [Duloxetine Hcl] Nausea And Vomiting and Other (See Comments)    Rapid drop in blood pressure  . Neurontin [Gabapentin] Other (See Comments)    Makes me goofy, keeps me off balance, clouds my thinking  . Lyrica [Pregabalin] Other (See Comments)    Makes me goofy, keeps me off balance, clouds my thinking  . Keppra [Levetiracetam] Nausea And Vomiting    Antimicrobials this admission: 2/25 Zosyn  >>  2/25 Levaquin >> 2/25 vancomycin >>  Microbiology results: 2/25 BCx: ngtd 2/25 MRSA PCR: negative   Thank you for allowing pharmacy to be a part of this patient's care.  Vincenza Hews, PharmD, BCPS 12/22/2016, 5:57 PM

## 2016-12-22 NOTE — Progress Notes (Signed)
New O2 requirement noted after 3L total IVF:  Currently at bedside. 1) IVF stopped 2) stat CXR obtained 3) Spoke with cardiology Dr. Jeannine Boga: 4) '5mg'$  IV metoprolol, repeated x1 5) cardizem gtt, no bolus started 6) Magnesium ordered and pending  HR down to ~120-130.  BP currently 103/65, 750 cc UOP.

## 2016-12-22 NOTE — Progress Notes (Signed)
RN paged me that patient had BM that "smells like c-diff" and requested stool test for this.  Have put in order for this.

## 2016-12-23 ENCOUNTER — Encounter (HOSPITAL_COMMUNITY): Payer: Self-pay | Admitting: Physician Assistant

## 2016-12-23 DIAGNOSIS — Z7984 Long term (current) use of oral hypoglycemic drugs: Secondary | ICD-10-CM | POA: Diagnosis not present

## 2016-12-23 DIAGNOSIS — I2510 Atherosclerotic heart disease of native coronary artery without angina pectoris: Secondary | ICD-10-CM | POA: Diagnosis present

## 2016-12-23 DIAGNOSIS — A084 Viral intestinal infection, unspecified: Secondary | ICD-10-CM | POA: Diagnosis not present

## 2016-12-23 DIAGNOSIS — A0811 Acute gastroenteropathy due to Norwalk agent: Secondary | ICD-10-CM | POA: Diagnosis present

## 2016-12-23 DIAGNOSIS — J690 Pneumonitis due to inhalation of food and vomit: Secondary | ICD-10-CM | POA: Diagnosis present

## 2016-12-23 DIAGNOSIS — R112 Nausea with vomiting, unspecified: Secondary | ICD-10-CM | POA: Diagnosis not present

## 2016-12-23 DIAGNOSIS — I480 Paroxysmal atrial fibrillation: Secondary | ICD-10-CM

## 2016-12-23 DIAGNOSIS — I110 Hypertensive heart disease with heart failure: Secondary | ICD-10-CM | POA: Diagnosis present

## 2016-12-23 DIAGNOSIS — M479 Spondylosis, unspecified: Secondary | ICD-10-CM | POA: Diagnosis present

## 2016-12-23 DIAGNOSIS — K219 Gastro-esophageal reflux disease without esophagitis: Secondary | ICD-10-CM | POA: Diagnosis present

## 2016-12-23 DIAGNOSIS — E7800 Pure hypercholesterolemia, unspecified: Secondary | ICD-10-CM | POA: Diagnosis present

## 2016-12-23 DIAGNOSIS — Z8261 Family history of arthritis: Secondary | ICD-10-CM | POA: Diagnosis not present

## 2016-12-23 DIAGNOSIS — G4733 Obstructive sleep apnea (adult) (pediatric): Secondary | ICD-10-CM | POA: Diagnosis present

## 2016-12-23 DIAGNOSIS — E1165 Type 2 diabetes mellitus with hyperglycemia: Secondary | ICD-10-CM | POA: Diagnosis present

## 2016-12-23 DIAGNOSIS — R197 Diarrhea, unspecified: Secondary | ICD-10-CM | POA: Diagnosis not present

## 2016-12-23 DIAGNOSIS — E1140 Type 2 diabetes mellitus with diabetic neuropathy, unspecified: Secondary | ICD-10-CM | POA: Diagnosis not present

## 2016-12-23 DIAGNOSIS — E1142 Type 2 diabetes mellitus with diabetic polyneuropathy: Secondary | ICD-10-CM | POA: Diagnosis present

## 2016-12-23 DIAGNOSIS — Z7901 Long term (current) use of anticoagulants: Secondary | ICD-10-CM | POA: Diagnosis not present

## 2016-12-23 DIAGNOSIS — I5042 Chronic combined systolic (congestive) and diastolic (congestive) heart failure: Secondary | ICD-10-CM | POA: Diagnosis present

## 2016-12-23 DIAGNOSIS — Z955 Presence of coronary angioplasty implant and graft: Secondary | ICD-10-CM | POA: Diagnosis not present

## 2016-12-23 DIAGNOSIS — J181 Lobar pneumonia, unspecified organism: Secondary | ICD-10-CM | POA: Diagnosis not present

## 2016-12-23 DIAGNOSIS — R0902 Hypoxemia: Secondary | ICD-10-CM | POA: Diagnosis present

## 2016-12-23 DIAGNOSIS — I4891 Unspecified atrial fibrillation: Secondary | ICD-10-CM | POA: Diagnosis present

## 2016-12-23 DIAGNOSIS — Z833 Family history of diabetes mellitus: Secondary | ICD-10-CM | POA: Diagnosis not present

## 2016-12-23 DIAGNOSIS — Z8679 Personal history of other diseases of the circulatory system: Secondary | ICD-10-CM | POA: Diagnosis not present

## 2016-12-23 DIAGNOSIS — Z888 Allergy status to other drugs, medicaments and biological substances status: Secondary | ICD-10-CM | POA: Diagnosis not present

## 2016-12-23 DIAGNOSIS — Z8249 Family history of ischemic heart disease and other diseases of the circulatory system: Secondary | ICD-10-CM | POA: Diagnosis not present

## 2016-12-23 DIAGNOSIS — I481 Persistent atrial fibrillation: Secondary | ICD-10-CM | POA: Diagnosis present

## 2016-12-23 DIAGNOSIS — I252 Old myocardial infarction: Secondary | ICD-10-CM | POA: Diagnosis not present

## 2016-12-23 DIAGNOSIS — E1149 Type 2 diabetes mellitus with other diabetic neurological complication: Secondary | ICD-10-CM | POA: Diagnosis present

## 2016-12-23 LAB — CBC
HCT: 38.3 % — ABNORMAL LOW (ref 39.0–52.0)
Hemoglobin: 13.1 g/dL (ref 13.0–17.0)
MCH: 30.7 pg (ref 26.0–34.0)
MCHC: 34.2 g/dL (ref 30.0–36.0)
MCV: 89.7 fL (ref 78.0–100.0)
Platelets: 99 10*3/uL — ABNORMAL LOW (ref 150–400)
RBC: 4.27 MIL/uL (ref 4.22–5.81)
RDW: 13.8 % (ref 11.5–15.5)
WBC: 8.6 10*3/uL (ref 4.0–10.5)

## 2016-12-23 LAB — GASTROINTESTINAL PANEL BY PCR, STOOL (REPLACES STOOL CULTURE)

## 2016-12-23 LAB — BASIC METABOLIC PANEL
Anion gap: 8 (ref 5–15)
BUN: 23 mg/dL — ABNORMAL HIGH (ref 6–20)
CO2: 21 mmol/L — ABNORMAL LOW (ref 22–32)
Calcium: 8.3 mg/dL — ABNORMAL LOW (ref 8.9–10.3)
Chloride: 103 mmol/L (ref 101–111)
Creatinine, Ser: 1.21 mg/dL (ref 0.61–1.24)
GFR calc Af Amer: >60 mL/min (ref >60)
GFR calc non Af Amer: 56 mL/min — ABNORMAL LOW (ref >60)
Glucose, Bld: 258 mg/dL — ABNORMAL HIGH (ref 65–99)
Potassium: 3.5 mmol/L (ref 3.5–5.1)
Sodium: 132 mmol/L — ABNORMAL LOW (ref 135–145)

## 2016-12-23 LAB — GLUCOSE, CAPILLARY
Glucose-Capillary: 158 mg/dL — ABNORMAL HIGH (ref 65–99)
Glucose-Capillary: 209 mg/dL — ABNORMAL HIGH (ref 65–99)
Glucose-Capillary: 199 mg/dL — ABNORMAL HIGH (ref 65–99)
Glucose-Capillary: 199 mg/dL — ABNORMAL HIGH (ref 65–99)

## 2016-12-23 LAB — HEPARIN LEVEL (UNFRACTIONATED)
Heparin Unfractionated: 0.31 IU/mL (ref 0.30–0.70)
Heparin Unfractionated: 0.37 IU/mL (ref 0.30–0.70)

## 2016-12-23 LAB — MAGNESIUM: Magnesium: 2 mg/dL (ref 1.7–2.4)

## 2016-12-23 LAB — APTT: aPTT: 83 seconds — ABNORMAL HIGH (ref 24–36)

## 2016-12-23 MED ORDER — RIVAROXABAN 20 MG PO TABS
20.0000 mg | ORAL_TABLET | Freq: Every day | ORAL | Status: DC
  Administered 2016-12-23 – 2016-12-25 (×3): 20 mg via ORAL
  Filled 2016-12-23 (×3): qty 1

## 2016-12-23 MED ORDER — SACCHAROMYCES BOULARDII 250 MG PO CAPS
250.0000 mg | ORAL_CAPSULE | Freq: Two times a day (BID) | ORAL | Status: DC
  Administered 2016-12-23 – 2016-12-26 (×7): 250 mg via ORAL
  Filled 2016-12-23 (×7): qty 1

## 2016-12-23 MED ORDER — LOPERAMIDE HCL 2 MG PO CAPS
2.0000 mg | ORAL_CAPSULE | ORAL | Status: DC | PRN
  Administered 2016-12-23 (×2): 2 mg via ORAL
  Filled 2016-12-23 (×2): qty 1

## 2016-12-23 NOTE — Progress Notes (Addendum)
Inpatient Diabetes Program Recommendations  AACE/ADA: New Consensus Statement on Inpatient Glycemic Control (2015)  Target Ranges:  Prepandial:   less than 140 mg/dL      Peak postprandial:   less than 180 mg/dL (1-2 hours)      Critically ill patients:  140 - 180 mg/dL   Lab Results  Component Value Date   GLUCAP 209 (H) 12/23/2016   HGBA1C (H) 11/26/2010    6.3 (NOTE)                                                                       According to the ADA Clinical Practice Recommendations for 2011, when HbA1c is used as a screening test:   >=6.5%   Diagnostic of Diabetes Mellitus           (if abnormal result  is confirmed)  5.7-6.4%   Increased risk of developing Diabetes Mellitus  References:Diagnosis and Classification of Diabetes Mellitus,Diabetes AJOI,7867,67(MCNOB 1):S62-S69 and Standards of Medical Care in         Diabetes - 2011,Diabetes Care,2011,34  (Suppl 1):S11-S61.    Review of Glycemic Control  Diabetes history: dm2  Inpatient Diabetes Program Recommendations:   Correction (SSI): Please consider adding Novolog 0-5 units qhs to current order of Novolog moderate scale 0-15 units tidac.  Oral Agents: Agree with holding Metformin 1000 mg bid, Amaryl 2 mg daily, and Jardiance 10 mg daily.  HgbA1C: No current A1C noted in chart.  Per ADA recommendations "consider performing an A1C on all patients with diabetes or hyperglycemia admitted to the hospital if not performed in the prior 3 months".  Thank you,  Windy Carina, RN, MSN Diabetes Coordinator Inpatient Diabetes Program [phone] (Team Pager)

## 2016-12-23 NOTE — Consult Note (Signed)
The patient has been seen in conjunction with [name], PA-C. All aspects of care have been considered and discussed. The patient has been personally interviewed, examined, and all clinical data has been reviewed.   [name] is well known to me with known multivessel coronary disease, chronic combined systolic and diastolic heart failure, history of paroxysmal atrial fibrillation usually maintaining rhythm on oral amiodarone, prior electrical cardioversions have been required, hypertension, chronic kidney disease, and abdominal aortic aneurysm repair. Also has a history of sleep apnea. Admitted with infectious process and noted to be an atrial fib with rapid rate.  He appears very stable at this time. Blood pressure is soft and about 98/60 mmHg. Heart rate 88 and in atrial fibrillation.  Overall impression is that of acute atrial fibrillation, exacerbated/precipitated by acute infectious illness.  Plan is to increase amiodarone to 400 mg daily. Give IV amiodarone bolus if increased rate and consider IV amiodarone infusion if needed. Continue oral beta blocker therapy. May need cardioversion prior to discharge. Continue anticoagulation.   Cardiology Consultation Note    Patient ID: [name], MRN: [id number], DOB/AGE: Mar 08, 1941 76 y.o. Admit date: 12/21/2016   Date of Consult: 12/23/2016 Primary Physician: Irven Shelling, MD Primary Cardiologist: Dr. Tamala Julian  Chief Complaint: weakness, nausea, vomiting, diarrhea, fever Reason for Consultation: atrial fib RVR Requesting MD: Dr. Maryland Pink  HPI: [name] is a 76 y.o. 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 He was admited  12/21/16 with 1-day history of weakness, fever of 102.1 at home, and large amounts of nausea, vomiting, and diarrhea. In the ED he was hypotensive and tachycardic into the 150s (AF) warranting 3L of IVF. He subsequently developed an oxygen requirement. Given his persistent tachycardia, IM discussed with cardiology fellow who advised IV metoprolol and cardizem drip. In total he's gotten 3 doses of IV metoprolol in addition to his scheduled oral metoprolol '25mg'$  BID.  He was ultimately diagnosed with LLL PNA as well as gastroenteritis and started on abx. Gastro panel pending. Flu and C diff neg. Trop 0.03. Otherwise labs notable for plt 99 (c/w prior), Na 132, current BUN/Cr 23/1.21, Mg 1.6 s/p repletion yesterday. He could tell "something was off" with his heart rhythm when all this started but is unaware of his atrial fib right now, for example. Denies CP, SOB, syncope, GI bleeding. Still had some diarrhea this AM. Rate now 90s. No known sick contacts. Dilt drip is now off.   Past Medical History:  Diagnosis Date  . AAA (abdominal aortic aneurysm) ([place])    a. s/p repair 2009.  [name] Acid reflux    takes Prilosec and Protonix daily  . Arthritis    BacK  . CAD in native artery    a. a. prior inf MI 1993. b. PCI to LAD 05/1997. c. recurrent inferolateral MI complicated by V. fib arrest in 04/1998, prior PCI to OM1. d. 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  [name] Chronic combined systolic and diastolic CHF (congestive heart failure) ([place])   . Complication of anesthesia    -years ago hair fell out.;ileus after 2 of his surgeries  . Dry skin   . Foot drop, bilateral   .  Hypercholesteremia   . Hypertension   . Ileus ([place])    After AAA  . Incisional hernia    "small, from AAA"  . Ischemic cardiomyopathy   . MI (myocardial infarction)   . Neuropathy ([facility])    in feet & legs  . OSA on CPAP    uses CPAP--sleep study done at least 55yr ago  . PAF ([place]oxysmal [place] fibrillation) [place]   . Pain    back pain  chronic- seen at pain clinic  . Plantar fasciitis    bilatetral  . Type II diabetes mellitus ([place]       Surgical History:  Past Surgical History:  Procedure Laterality Date  . ABDOMINAL AORTIC ANEURYSM REPAIR    . BACK SURGERY  2012-2013 X 3   Miminal Invasive x 3in [place].  .Marland KitchenCARDIAC CATHETERIZATION  2009/2012  . CARDIOVERSION N/A 04/12/2015   Procedure: CARDIOVERSION;  Surgeon: TSueanne Margarita MD;  Location: [facility];  Service: Cardiovascular;  Laterality: N/A;  . COLONOSCOPY    . CORONARY ANGIOPLASTY WITH STENT PLACEMENT  1992; 1997  . LAPAROSCOPIC CHOLECYSTECTOMY    . LUMBAR LAMINECTOMY/DECOMPRESSION MICRODISCECTOMY Right 01/16/2016   Procedure: Right Lumbar five-Sacral one Laminectomy;  Surgeon: HKristeen Miss MD;  [facility];  Service: Neurosurgery;  Laterality: Right;  Right L5-S1 Laminectomy     Home Meds: Prior to Admission medications   Medication Sig Start Date End Date Taking? Authorizing Provider  amiodarone (PACERONE) 200 MG tablet TAKE ONE TABLET BY MOUTH ONCE DAILY 04/29/16  Yes HBelva Crome MD  atorvastatin (LIPITOR) 10 MG tablet Take 10 mg by mouth daily.   Yes Historical Provider, MD  Coenzyme Q10 (CO Q 10) 100 MG CAPS Take 100 mg by mouth daily.   Yes Historical Provider, MD  diazepam (VALIUM) 5 MG tablet Take 5 mg by mouth 2 (two) times daily as needed (leg cramps/ sleep).  10/31/15  Yes Historical Provider, MD  empagliflozin (JARDIANCE) 10 MG TABS tablet Take 10 mg by mouth daily with breakfast.    Yes Historical Provider, MD  glimepiride (AMARYL) 2 MG tablet Take 2 mg by mouth daily with breakfast.  01/10/15  Yes Historical Provider, MD  metFORMIN (GLUCOPHAGE-XR) 500 MG 24 hr tablet Take 1,000 mg by mouth 2 (two) times daily.  10/12/15  Yes Historical Provider, MD  metoprolol (LOPRESSOR) 50 MG tablet TAKE ONE TABLET BY MOUTH TWICE DAILY 11/18/16  Yes HBelva Crome MD  Multiple Vitamin (MULTIVITAMIN WITH MINERALS) TABS Take 1 tablet by mouth daily.    Yes Historical Provider, MD  omeprazole (PRILOSEC) 20 MG capsule Take 20 mg by mouth daily. 09/14/12  Yes Historical Provider, MD  oxyCODONE-acetaminophen (PERCOCET/ROXICET) 5-325 MG tablet Take 1 tablet by mouth every 6 (six) hours as needed (pain).    Yes Historical Provider, MD  pyridOXINE (VITAMIN B-6) 100 MG tablet Take 100 mg by mouth daily.   Yes Historical Provider, MD  vitamin B-12 (CYANOCOBALAMIN) 1000 MCG tablet Take 1,000 mcg by mouth daily.    Yes Historical Provider, MD  XARELTO 20 MG TABS tablet TAKE ONE TABLET BY MOUTH ONCE DAILY WITH SUPPER 09/27/16  Yes HBelva Crome MD    Inpatient Medications:  . amiodarone  200 mg Oral Daily  . atorvastatin  10 mg Oral q1800  . insulin aspart  0-15 Units Subcutaneous TID WC  . levofloxacin (LEVAQUIN) IV  750 mg Intravenous Q24H  . metoprolol tartrate  25 mg Oral BID  . multivitamin with minerals  1 tablet Oral Daily  . pantoprazole  40 mg Oral Daily  . piperacillin-tazobactam (ZOSYN)  IV  3.375 g Intravenous Q8H  . saccharomyces boulardii  250 mg Oral BID  . sodium chloride flush  3 mL Intravenous Q12H  . vancomycin  1,000 mg Intravenous Q12H   . diltiazem (CARDIZEM) infusion Stopped (12/23/16 0139)  . heparin 1,300 Units/hr (12/23/16 0800)    Allergies:  Allergies  Allergen Reactions  . Cymbalta [Duloxetine Hcl] Nausea And Vomiting and Other (See Comments)    Rapid drop in blood pressure  . Neurontin [Gabapentin] Other (See Comments)    Makes me goofy, keeps me off balance, clouds my thinking  . Lyrica [Pregabalin] Other (See Comments)    Makes me goofy, keeps me off balance, clouds my thinking  . Keppra [Levetiracetam] Nausea And Vomiting    Social History   Social History  . Marital status: Married    Spouse name: N/A  . Number of children: N/A  . Years of education: N/A   Occupational History  . Not on file.   Social History Main Topics  . Smoking status: Former Smoker    Packs/day: 1.00    Years: 33.00     Types: Cigarettes    Quit date: 01/01/1992  . Smokeless tobacco: Never Used  . Alcohol use Yes     Comment: 6/30;2017 "GLASS OF WINE Q COUPLE MONTHS, IF THAT"  . Drug use: No  . Sexual activity: Yes   Other Topics Concern  . Not on file   Social History Narrative  . No narrative on file     Family History  Problem Relation Age of Onset  . Diabetes Mother   . Coronary artery disease Mother   . Diabetes Father   . Arthritis Father   . Rheum arthritis Father   . Coronary artery disease Father      Review of Systems: All other systems reviewed and are otherwise negative except as noted above.  Labs:  Recent Labs  12/22/16 0259  TROPONINI 0.03*   Lab Results  Component Value Date   WBC 8.6 12/23/2016   HGB 13.1 12/23/2016   HCT 38.3 (L) 12/23/2016   MCV 89.7 12/23/2016   PLT 99 (L) 12/23/2016    Recent Labs Lab 12/21/16 1847  12/23/16 0132  NA 140  < > 132*  K 3.9  < > 3.5  CL 108  < > 103  CO2 19*  < > 21*  BUN 28*  < > 23*  CREATININE 0.86  < > 1.21  CALCIUM 9.5  < > 8.3*  PROT 6.9  --   --   BILITOT 1.0  --   --   ALKPHOS 54  --   --   ALT 22  --   --   AST 26  --   --   GLUCOSE 200*  < > 258*  < > = values in this interval not displayed.    Radiology/Studies:  Dg Chest Port 1 View  Result Date: 12/22/2016 CLINICAL DATA:  Febrile, hypoxemia. History of hypertension and diabetes. EXAM: PORTABLE CHEST 1 VIEW COMPARISON:  Chest radiograph December 22, 2016 at 0131 hours FINDINGS: Cardiomediastinal silhouette is normal. Mildly calcified aortic knob. Patchy LEFT lung base airspace opacities are less conspicuous than prior radiograph. No pleural effusion. No pneumothorax. Soft tissue planes and included osseous structures are unchanged. IMPRESSION: LEFT lung base atelectasis versus pneumonia. Electronically Signed   By: Elon Alas M.D.   On:  12/22/2016 19:48   Dg Chest Portable 1 View  Result Date: 12/21/2016 CLINICAL DATA:  Nausea and vomiting.  EXAM: PORTABLE CHEST 1 VIEW COMPARISON:  04/26/16 FINDINGS: Mild cardiac enlargement. Lung volumes are low. Atelectasis is identified within the left lower lobe. No airspace opacities identified. IMPRESSION: 1. Left lower lobe atelectasis Electronically Signed   By: Kerby Moors M.D.   On: 12/21/2016 19:41   Dg Chest Port 1v Same Day  Result Date: 12/22/2016 CLINICAL DATA:  Initial evaluation for acute hypoxia. EXAM: PORTABLE CHEST 1 VIEW COMPARISON:  Prior radiograph from 12/21/2016. FINDINGS: Moderate cardiomegaly. Mediastinal silhouette within normal limits. Aortic atherosclerosis. Lungs hypoinflated. Persistent patchy opacity at the left lung base, either atelectasis or infiltrate. Mild perihilar vascular congestion without overt pulmonary edema. No pleural effusion. No pneumothorax. Osseous structures unchanged. IMPRESSION: 1. Persistent patchy left basilar opacity, which may reflect atelectasis and/or infiltrate. 2. Stable cardiomegaly with mild perihilar vascular congestion without overt pulmonary edema. 3. Aortic atherosclerosis. Electronically Signed   By: Jeannine Boga M.D.   On: 12/22/2016 03:03    Wt Readings from Last 3 Encounters:  12/22/16 213 lb 6.5 oz (96.8 kg)  06/25/16 204 lb 6.4 oz (92.7 kg)  04/26/16 198 lb 1.6 oz (89.9 kg)    EKG: atrial fib 159bpm, old inferior MI, nonspecific ST-T Changes  Physical Exam: Blood pressure 98/83, pulse (!) 115, temperature 99.1 F (37.3 C), temperature source Oral, resp. rate (!) 21, height '6\' 3"'$  (1.905 m), weight 213 lb 6.5 oz (96.8 kg), SpO2 98 %. Body mass index is 26.67 kg/m. General: Well developed, well nourished WM, in no acute distress. Laying 20 degrees in bed without signifcant dyspnea. Head: Normocephalic, atraumatic, sclera non-icteric, no xanthomas, nares are without discharge.  Neck: Negative for carotid bruits. JVD not elevated. Lungs: Clear bilaterally to auscultation without wheezes, rales, or rhonchi. Breathing is  unlabored. Heart: Irregularly irregular, rate upper limits of normal, with S1 S2. No murmurs, rubs, or gallops appreciated. Abdomen: Soft, non-tender, non-distended with normoactive bowel sounds. No hepatomegaly. No rebound/guarding. No obvious abdominal masses. Msk:  Strength and tone appear normal for age. Extremities: No clubbing or cyanosis. No edema.  Distal pedal pulses are 2+ and equal bilaterally. Neuro: Alert and oriented X 3. No facial asymmetry. No focal deficit. Moves all extremities spontaneously. Psych:  Responds to questions appropriately with a normal affect.     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 fib arrest in 04/1998, prior PCI to OM1, known CTO of RCA by cath 10/2010), ischemic cardiomyopathy, chronic combined CHF, HTN, AAA s/p repair in 2009, HLD, OSA, DM, chronic pain, severe polyneuropathy. Admitted with acute gastroenteritis with SIRS features (tachycardia, hypotension), also being treated for PNA with fever and abnormal CXR. Cardiology consulted for AF RVR.  1. Gastroenteritis and possible CAP - per IM.  2. Atrial fib RVR - likely reactive to #1. Rate is presently improved following the above measures. Dilt drip has been turned off overnight. Right now he's currently on metoprolol '25mg'$  BID and amiodarone '200mg'$  daily with HR 90s - rate seems appropriate for level of illness. Patient currently unaware of his rhythm. He could be having PAF outside of the hospital but in general has maintained NSR by last few EKGs in 2017. His last DCCV was in 2016. Would allow him to recover from his acute illness and either consider DCCV prior to  DC if he does not convert, or have him f/u in the office to determine if he's still in atrial fib and then go forward. He is currently on heparin. Would change back to NOAC when IM feels is safe. Check TSH with AM labs given  amio.  3. CAD - minimally elevated trop on admission likely demand in nature. Continue surveillance for any angina.  4. Chronic combined CHF - appears euvolemic right now. Follow volume status with strict I/O's and daily weights. S/p IVF replacement. Would caution against any standing fluids right now unless he begins actively vomiting again.  5. Hypomagnesemia - likely due to GI losses; improved after repletion. F/u again in AM.  Signed, Charlie Pitter PA-C 12/23/2016, 10:25 AM Pager: [phone]

## 2016-12-23 NOTE — Progress Notes (Addendum)
ANTICOAGULATION CONSULT NOTE - Follow Up Consult  Pharmacy Consult for heparin Indication: atrial fibrillation  Labs:  Recent Labs  12/21/16 1847 12/22/16 0248 12/22/16 0259 12/22/16 0859 12/22/16 1638 12/23/16 0132 12/23/16 0854  HGB 15.9 14.5  --   --  13.8 13.1  --   HCT 45.4 42.2  --   --  39.4 38.3*  --   PLT 105* 108*  --   --  99* 99*  --   APTT  --  33  --   --   --  83*  --   HEPARINUNFRC  --  0.61  --   --   --  0.31 0.37  CREATININE 0.86  --   --  1.04  --  1.21  --   TROPONINI  --   --  0.03*  --   --   --   --     Assessment: 76yo male therapeutic on heparin while Xarelto on hold due to acute illness.  Heparin drip 1300 uts/hr heparin  level 0.37 at goal.  CBC ok pltc low stable.  Goal of Therapy:  Heparin level 0.3-0.7 units/ml    Plan:  Continue heparin drip 1300 uts/hr Daily HL, CBC F/u restart oral AC when stable  Bonnita Nasuti Pharm.D. CPP, BCPS Clinical Pharmacist [phone] 12/23/2016 11:45 AM    Addendum Stop heparin Restart home Rivaroxaban '20mg'$  daily - dose ok for renal function.  Bonnita Nasuti Pharm.D. CPP, BCPS Clinical Pharmacist [phone] 12/23/2016 2:36 PM

## 2016-12-23 NOTE — Progress Notes (Signed)
Patient places self on and off of CPAP when ready. 

## 2016-12-23 NOTE — Progress Notes (Signed)
[facility] PROGRESS NOTE  [name] KCL:[id number] DOB: 06/23/1941 DOA: 12/21/2016  PCP: Irven Shelling, MD  Brief History/Interval Summary: 76 year old Caucasian male with a past medical history of paroxysmal atrial fibrillation on amiodarone and was on anticoagulation, history of coronary artery disease, hypertension, presented with complaints of nausea, vomiting and diarrhea. Patient was noted to be hypotensive and tachycardic. He was in atrial fibrillation with RVR. He was hospitalized for further management.  Reason for Visit: Atrial fibrillation with RVR. Gastroenteritis. Pneumonia, likely aspiration.  Consultants: None  Procedures: None  Antibiotics: None  Subjective/Interval History: Patient states that he is feeling better. Still has a cough. Denies any shortness of breath. No nausea, vomiting. No further episodes of diarrhea.   ROS: Denies any abdominal pain.  Objective:  Vital Signs  Vitals:   12/23/16 0330 12/23/16 0400 12/23/16 0430 12/23/16 0500  BP: (!) 95/54 (!) '95/56 96/60 98/66 '$  Pulse: 73 74 91 81  Resp: (!) 25 (!) 21 (!) 21 (!) 22  Temp:    99.5 F (37.5 C)  TempSrc:    Oral  SpO2: 92% 94% 96% 95%  Weight:      Height:        Intake/Output Summary (Last 24 hours) at 12/23/16 0816 Last data filed at 12/23/16 0400  Gross per 24 hour  Intake          1750.08 ml  Output              625 ml  Net          1125.08 ml   Filed Weights   12/21/16 1835 12/22/16 0106 12/22/16 0228  Weight: 91.6 kg (202 lb) 97.9 kg (215 lb 13.3 oz) 96.8 kg (213 lb 6.5 oz)    General appearance: alert, cooperative, appears stated age and no distress Resp: Crackles noted Left lung base. No wheezing, no rhonchi. Cardio: S1 S2 is irregularly irregular. No S3, S4. No rubs, murmurs, or bruit. No pedal edema. GI: soft, non-tender; bowel sounds normal; no masses,  no organomegaly Extremities: extremities normal, atraumatic, no cyanosis or edema Neurological:  Patient is awake, alert. No facial asymmetry. Moving all his extremities. Motor strength is equal bilaterally.  Lab Results:  Data Reviewed: I have personally reviewed following labs and imaging studies  CBC:  Recent Labs Lab 12/21/16 1847 12/22/16 0248 12/22/16 1638 12/23/16 0132  WBC 7.6 6.5 8.8 8.6  NEUTROABS 6.9  --  6.9  --   HGB 15.9 14.5 13.8 13.1  HCT 45.4 42.2 39.4 38.3*  MCV 89.9 91.1 90.6 89.7  PLT 105* 108* 99* 99*    Basic Metabolic Panel:  Recent Labs Lab 12/21/16 1847 12/22/16 0248 12/22/16 0859 12/23/16 0132  NA 140  --  137 132*  K 3.9  --  3.8 3.5  CL 108  --  105 103  CO2 19*  --  23 21*  GLUCOSE 200*  --  182* 258*  BUN 28*  --  24* 23*  CREATININE 0.86  --  1.04 1.21  CALCIUM 9.5  --  8.5* 8.3*  MG  --  1.6*  --  2.0    GFR: Estimated Creatinine Clearance: 61.1 mL/min (by C-G formula based on SCr of 1.21 mg/dL).  Liver Function Tests:  Recent Labs Lab 12/21/16 1847  AST 26  ALT 22  ALKPHOS 54  BILITOT 1.0  PROT 6.9  ALBUMIN 3.9     Recent Labs Lab 12/21/16 1847  LIPASE 28   Cardiac Enzymes:  Recent Labs Lab 12/22/16 0259  TROPONINI 0.03*    CBG:  Recent Labs Lab 12/22/16 0828 12/22/16 1211 12/22/16 1556 12/22/16 2146 12/23/16 0759  GLUCAP 159* 164* 164* 241* 158*    Recent Results (from the past 240 hour(s))  MRSA PCR Screening     Status: None   Collection Time: 12/22/16  1:01 AM  Result Value Ref Range Status   MRSA by PCR NEGATIVE NEGATIVE Final    Comment:        The GeneXpert MRSA Assay (FDA approved for NASAL specimens only), is one component of a comprehensive MRSA colonization surveillance program. It is not intended to diagnose MRSA infection nor to guide or monitor treatment for MRSA infections.   C difficile quick scan w PCR reflex     Status: None   Collection Time: 12/22/16  8:45 AM  Result Value Ref Range Status   C Diff antigen NEGATIVE NEGATIVE Final   C Diff toxin NEGATIVE  NEGATIVE Final   C Diff interpretation No C. difficile detected.  Final      Radiology Studies: Dg Chest Port 1 View  Result Date: 12/22/2016 CLINICAL DATA:  Febrile, hypoxemia. History of hypertension and diabetes. EXAM: PORTABLE CHEST 1 VIEW COMPARISON:  Chest radiograph December 22, 2016 at 0131 hours FINDINGS: Cardiomediastinal silhouette is normal. Mildly calcified aortic knob. Patchy LEFT lung base airspace opacities are less conspicuous than prior radiograph. No pleural effusion. No pneumothorax. Soft tissue planes and included osseous structures are unchanged. IMPRESSION: LEFT lung base atelectasis versus pneumonia. Electronically Signed   By: Elon Alas M.D.   On: 12/22/2016 19:48   Dg Chest Portable 1 View  Result Date: 12/21/2016 CLINICAL DATA:  Nausea and vomiting. EXAM: PORTABLE CHEST 1 VIEW COMPARISON:  04/26/16 FINDINGS: Mild cardiac enlargement. Lung volumes are low. Atelectasis is identified within the left lower lobe. No airspace opacities identified. IMPRESSION: 1. Left lower lobe atelectasis Electronically Signed   By: Kerby Moors M.D.   On: 12/21/2016 19:41   Dg Chest Port 1v Same Day  Result Date: 12/22/2016 CLINICAL DATA:  Initial evaluation for acute hypoxia. EXAM: PORTABLE CHEST 1 VIEW COMPARISON:  Prior radiograph from 12/21/2016. FINDINGS: Moderate cardiomegaly. Mediastinal silhouette within normal limits. Aortic atherosclerosis. Lungs hypoinflated. Persistent patchy opacity at the left lung base, either atelectasis or infiltrate. Mild perihilar vascular congestion without overt pulmonary edema. No pleural effusion. No pneumothorax. Osseous structures unchanged. IMPRESSION: 1. Persistent patchy left basilar opacity, which may reflect atelectasis and/or infiltrate. 2. Stable cardiomegaly with mild perihilar vascular congestion without overt pulmonary edema. 3. Aortic atherosclerosis. Electronically Signed   By: Jeannine Boga M.D.   On: 12/22/2016 03:03      Medications:  Scheduled: . amiodarone  200 mg Oral Daily  . atorvastatin  10 mg Oral q1800  . insulin aspart  0-15 Units Subcutaneous TID WC  . levofloxacin (LEVAQUIN) IV  750 mg Intravenous Q24H  . metoprolol tartrate  25 mg Oral BID  . multivitamin with minerals  1 tablet Oral Daily  . pantoprazole  40 mg Oral Daily  . piperacillin-tazobactam (ZOSYN)  IV  3.375 g Intravenous Q8H  . sodium chloride flush  3 mL Intravenous Q12H  . vancomycin  1,000 mg Intravenous Q12H   Continuous: . diltiazem (CARDIZEM) infusion Stopped (12/23/16 0139)  . heparin 1,300 Units/hr (12/23/16 0401)   FIE:PPIRJJOACZYSA, diazepam, ondansetron (ZOFRAN) IV, oxyCODONE-acetaminophen, promethazine  Assessment/Plan:  Principal Problem:   Gastroenteritis and colitis, viral Active Problems:   DM (diabetes mellitus), type 2,  uncontrolled w/neurologic complication (HCC)   Paroxysmal atrial fibrillation with RVR (HCC)   Chronic combined systolic and diastolic heart failure (HCC)   Nausea vomiting and diarrhea    Atrial fibrillation with RVR Patient with known history of Paroxysmal atrial fibrillation. He is followed by Dr. Tamala Julian. He is on amiodarone and anticoagulation with Xarelto at home. Patient's current episode appeared to have been triggered by hypovolemia and acute illness. Patient was placed on a Cardizem infusion. His amiodarone was continued. Beta blocker was added on 2/25 once blood pressures stabilized. Heart rate has improved, but he remains in atrial fibrillation. At this time we will consult cardiology. Patient is on Xarelto at home, but here he is on heparin. Cardizem infusion was stopped last night once heart rate was controlled. Blood pressure was also an issue overnight. Stable this morning.  Pneumonia, likely aspiration versus healthcare associated Patient developed fever about 48 hours ago. Initially low-grade and then he spiked to 102. Chest x-ray does show left-sided atelectasis  versus infiltrate. He does have significant crackles on examination. So it is possible that he may have aspirated. Blood cultures and lactic acid levels were obtained. Lactic acid level was normal. Influenza was negative. C. difficile negative. Continue vancomycin and Zosyn for now. Follow-up on cultures.  Acute gastroenteritis Probably viral. Symptoms have improved. Stool studies negative for C. difficile. Symptomatic treatment.  History of diabetes mellitus type 2. Continue sliding scale coverage. Monitor CBGs. Patient's oral medications are on hold.  History of Coronary artery disease Stable. Continue medications.  History of chronic systolic CHF Ejection fraction was 40% based on echocardiogram from 2016. Caution with IV fluids. No evidence for volume overload at this time.  History of abdominal aortic aneurysm. This is being monitored in the outpatient setting.  DVT Prophylaxis: On IV heparin    Code Status: Full code  Family Communication: Discussed with patient. No family at bedside  Disposition Plan: Management as outlined above.    LOS: 0 days   [facility] Pager [phone] 12/23/2016, 8:16 AM  If 7PM-7AM, please contact night-coverage at [email or web address], password [facility]

## 2016-12-23 NOTE — Care Management Obs Status (Signed)
[facility] NOTIFICATION   Patient Details  Name: [name] MRN: [id number] Date of Birth: [birth year]   Medicare Observation Status Notification Given:  Yes    Maryclare Labrador, RN 12/23/2016, 10:10 AM

## 2016-12-23 NOTE — Care Management Note (Signed)
Case Management Note  Patient Details  Name: [name] MRN: [id number] Date of Birth: [birth year]  Subjective/Objective:     Pt admitted for hypotension and A fib               Action/Plan:   PTA independent from home with wife - wife at bedside during assessment.  Pt denied barriers to returning home, obtaining PCP visits and medications.  CM will continue to follow for discharge needs   Expected Discharge Date:                  Expected Discharge Plan:  Home/Self Care  In-House Referral:     Discharge planning Services  CM Consult  Post Acute Care Choice:    Choice offered to:     DME Arranged:    DME Agency:     HH Arranged:    HH Agency:     Status of Service:  In process, will continue to follow  If discussed at Long Length of Stay Meetings, dates discussed:    Additional Comments:  Maryclare Labrador, RN 12/23/2016, 10:09 AM

## 2016-12-23 NOTE — Progress Notes (Signed)
ANTICOAGULATION CONSULT NOTE - Follow Up Consult  Pharmacy Consult for heparin Indication: atrial fibrillation  Labs:  Recent Labs  12/21/16 1847 12/22/16 0248 12/22/16 0259 12/22/16 0859 12/22/16 1638 12/23/16 0132  HGB 15.9 14.5  --   --  13.8 13.1  HCT 45.4 42.2  --   --  39.4 38.3*  PLT 105* 108*  --   --  99* 99*  APTT  --  33  --   --   --  83*  HEPARINUNFRC  --  0.61  --   --   --  0.31  CREATININE 0.86  --   --  1.04  --  1.21  TROPONINI  --   --  0.03*  --   --   --     Assessment: 76yo male therapeutic on heparin with initial dosing while Xarelto on hold though heparin level at low end of goal (PTT at goal as well); of note RN reports that pt has not had N/V since admission, admitted for gastroenteritis.  Goal of Therapy:  Heparin level 0.3-0.7 units/ml   Plan:  Will increase heparin gtt slightly to 1300 units/hr to prevent dropping below goal and check level in 6hr; will f/u w/ daytime rounds to see whether Xarelto can be resumed.  Wynona Neat, PharmD, BCPS  12/23/2016,3:31 AM

## 2016-12-24 DIAGNOSIS — J189 Pneumonia, unspecified organism: Secondary | ICD-10-CM

## 2016-12-24 DIAGNOSIS — J181 Lobar pneumonia, unspecified organism: Secondary | ICD-10-CM

## 2016-12-24 DIAGNOSIS — I2510 Atherosclerotic heart disease of native coronary artery without angina pectoris: Secondary | ICD-10-CM

## 2016-12-24 DIAGNOSIS — I4891 Unspecified atrial fibrillation: Secondary | ICD-10-CM

## 2016-12-24 LAB — GLUCOSE, CAPILLARY
Glucose-Capillary: 212 mg/dL — ABNORMAL HIGH (ref 65–99)
Glucose-Capillary: 302 mg/dL — ABNORMAL HIGH (ref 65–99)
Glucose-Capillary: 237 mg/dL — ABNORMAL HIGH (ref 65–99)
Glucose-Capillary: 186 mg/dL — ABNORMAL HIGH (ref 65–99)

## 2016-12-24 LAB — CBC
HCT: 38.1 % — ABNORMAL LOW (ref 39.0–52.0)
Hemoglobin: 13 g/dL (ref 13.0–17.0)
MCH: 30.5 pg (ref 26.0–34.0)
MCHC: 34.1 g/dL (ref 30.0–36.0)
MCV: 89.4 fL (ref 78.0–100.0)
Platelets: 97 10*3/uL — ABNORMAL LOW (ref 150–400)
RBC: 4.26 MIL/uL (ref 4.22–5.81)
RDW: 13.7 % (ref 11.5–15.5)
WBC: 6 10*3/uL (ref 4.0–10.5)

## 2016-12-24 LAB — BASIC METABOLIC PANEL
Anion gap: 7 (ref 5–15)
BUN: 12 mg/dL (ref 6–20)
CO2: 24 mmol/L (ref 22–32)
Calcium: 8.7 mg/dL — ABNORMAL LOW (ref 8.9–10.3)
Chloride: 107 mmol/L (ref 101–111)
Creatinine, Ser: 0.88 mg/dL (ref 0.61–1.24)
GFR calc Af Amer: >60 mL/min (ref >60)
GFR calc non Af Amer: >60 mL/min (ref >60)
Glucose, Bld: 206 mg/dL — ABNORMAL HIGH (ref 65–99)
Potassium: 3.7 mmol/L (ref 3.5–5.1)
Sodium: 138 mmol/L (ref 135–145)

## 2016-12-24 LAB — TSH: TSH: 0.746 u[IU]/mL (ref 0.350–4.500)

## 2016-12-24 MED ORDER — AMIODARONE HCL 200 MG PO TABS
400.0000 mg | ORAL_TABLET | Freq: Every day | ORAL | Status: DC
  Administered 2016-12-24 – 2016-12-26 (×3): 400 mg via ORAL
  Filled 2016-12-24 (×3): qty 2

## 2016-12-24 MED ORDER — METOPROLOL TARTRATE 25 MG PO TABS
25.0000 mg | ORAL_TABLET | Freq: Two times a day (BID) | ORAL | Status: DC
  Administered 2016-12-24 – 2016-12-25 (×2): 25 mg via ORAL
  Filled 2016-12-24 (×2): qty 1

## 2016-12-24 MED ORDER — INSULIN GLARGINE 100 UNIT/ML ~~LOC~~ SOLN
8.0000 [IU] | Freq: Every day | SUBCUTANEOUS | Status: DC
  Administered 2016-12-24 – 2016-12-26 (×3): 8 [IU] via SUBCUTANEOUS
  Filled 2016-12-24 (×3): qty 0.08

## 2016-12-24 MED ORDER — AMIODARONE IV BOLUS ONLY 150 MG/100ML
150.0000 mg | Freq: Once | INTRAVENOUS | Status: AC
  Administered 2016-12-24: 150 mg via INTRAVENOUS
  Filled 2016-12-24: qty 100

## 2016-12-24 MED ORDER — GUAIFENESIN ER 600 MG PO TB12
600.0000 mg | ORAL_TABLET | Freq: Two times a day (BID) | ORAL | Status: DC
  Administered 2016-12-24 – 2016-12-26 (×5): 600 mg via ORAL
  Filled 2016-12-24 (×5): qty 1

## 2016-12-24 NOTE — Progress Notes (Signed)
Progress Note  Patient Name: [name] Date of Encounter: 12/24/2016  Primary Cardiologist: Linard Millers  Subjective   No cardiac complaints. Noro virus improving symptomatically.  Inpatient Medications    Scheduled Meds: . amiodarone  400 mg Oral Daily  . atorvastatin  10 mg Oral q1800  . guaiFENesin  600 mg Oral BID  . insulin aspart  0-15 Units Subcutaneous TID WC  . levofloxacin (LEVAQUIN) IV  750 mg Intravenous Q24H  . metoprolol tartrate  25 mg Oral BID  . multivitamin with minerals  1 tablet Oral Daily  . pantoprazole  40 mg Oral Daily  . piperacillin-tazobactam (ZOSYN)  IV  3.375 g Intravenous Q8H  . rivaroxaban  20 mg Oral Q supper  . saccharomyces boulardii  250 mg Oral BID  . sodium chloride flush  3 mL Intravenous Q12H  . vancomycin  1,000 mg Intravenous Q12H   Continuous Infusions:  PRN Meds: acetaminophen, diazepam, loperamide, ondansetron (ZOFRAN) IV, oxyCODONE-acetaminophen, promethazine   Vital Signs    Vitals:   12/23/16 2010 12/23/16 2343 12/24/16 0356 12/24/16 0740  BP:  104/73 108/72 111/74  Pulse: (!) 113 98 (!) 105 (!) 145  Resp: (!) '26 18 19 '$ (!) 24  Temp:  99 F (37.2 C) 98.9 F (37.2 C) 98.9 F (37.2 C)  TempSrc:  Oral Oral Oral  SpO2: 94% 95% 93% 91%  Weight:   207 lb 14.4 oz (94.3 kg)   Height:        Intake/Output Summary (Last 24 hours) at 12/24/16 0843 Last data filed at 12/24/16 0500  Gross per 24 hour  Intake             1981 ml  Output              950 ml  Net             1031 ml   Filed Weights   12/22/16 0106 12/22/16 0228 12/24/16 0356  Weight: 215 lb 13.3 oz (97.9 kg) 213 lb 6.5 oz (96.8 kg) 207 lb 14.4 oz (94.3 kg)    Telemetry    Atrial fibrillation with poor rate control. No significant ventricular ectopy. - Personally Reviewed  ECG    Atrial fibrillation, rapid ventricular response. Incomplete right bundle branch block and evidence of old inferior infarct. - Personally Reviewed  Physical Exam  Lying flat  and comfortable. GEN: No acute distress.   Neck: No JVD Cardiac: Rapid IIR, no murmurs, rubs, or gallops.  Respiratory: Clear to auscultation bilaterally. GI: Soft, nontender, non-distended  MS: No edema; No deformity. Neuro:  Nonfocal  Psych: Normal affect   Labs    Chemistry Recent Labs Lab 12/21/16 1847 12/22/16 0859 12/23/16 0132 12/24/16 0226  NA 140 137 132* 138  K 3.9 3.8 3.5 3.7  CL 108 105 103 107  CO2 19* 23 21* 24  GLUCOSE 200* 182* 258* 206*  BUN 28* 24* 23* 12  CREATININE 0.86 1.04 1.21 0.88  CALCIUM 9.5 8.5* 8.3* 8.7*  PROT 6.9  --   --   --   ALBUMIN 3.9  --   --   --   AST 26  --   --   --   ALT 22  --   --   --   ALKPHOS 54  --   --   --   BILITOT 1.0  --   --   --   GFRNONAA >60 >60 56* >60  GFRAA >60 >60 >60 >60  ANIONGAP '13 9 8 7     '$ Hematology Recent Labs Lab 12/22/16 1638 12/23/16 0132 12/24/16 0226  WBC 8.8 8.6 6.0  RBC 4.35 4.27 4.26  HGB 13.8 13.1 13.0  HCT 39.4 38.3* 38.1*  MCV 90.6 89.7 89.4  MCH 31.7 30.7 30.5  MCHC 35.0 34.2 34.1  RDW 13.5 13.8 13.7  PLT 99* 99* 97*    Cardiac Enzymes Recent Labs Lab 12/22/16 0259  TROPONINI 0.03*   No results for input(s): TROPIPOC in the last 168 hours.   BNP Recent Labs Lab 12/22/16 0259  BNP 272.9*     DDimer No results for input(s): DDIMER in the last 168 hours.   Radiology    Dg Chest Port 1 View  Result Date: 12/22/2016 CLINICAL DATA:  Febrile, hypoxemia. History of hypertension and diabetes. EXAM: PORTABLE CHEST 1 VIEW COMPARISON:  Chest radiograph December 22, 2016 at 0131 hours FINDINGS: Cardiomediastinal silhouette is normal. Mildly calcified aortic knob. Patchy LEFT lung base airspace opacities are less conspicuous than prior radiograph. No pleural effusion. No pneumothorax. Soft tissue planes and included osseous structures are unchanged. IMPRESSION: LEFT lung base atelectasis versus pneumonia. Electronically Signed   By: Elon Alas M.D.   On: 12/22/2016 19:48      Echocardiogram4/16/16:  Study Conclusions  - Left ventricle: The cavity size was normal. Systolic function was mildly to moderately reduced. The estimated ejection fraction was in the range of 40% to 45%. Diffuse hypokinesis. There is hypokinesis of the inferior myocardium. Doppler parameters are consistent with abnormal left ventricular relaxation (grade 1 diastolic dysfunction). - Aorta: Aortic root dimension: 40 mm (ED). - Ascending aorta: The ascending aorta was mildly dilated. - Left atrium: The atrium was moderately dilated.  Impressions:  - EF is mildly improved when compared to prior echocardiogram.  Patient Profile     76 y.o. 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  ASSESSMENT:  1. Coronary artery disease with prior myocardial infarction 3, chronic occlusion of the RCA, LAD and circumflex stents, with most recent PCI in 2012. No evidence of infarction or acute ischemia on this occasion.  2. Acute illness related paroxysmal atrial fibrillation with poor rate control. Patient is chronically on amiodarone therapy and Xarelto was not outpatient. We'll plan to use additional amiodarone to control rate. May be able to resume low-dose beta blocker therapy if blood pressure allows. We will use  additional IV amiodarone doses for rate control. Will need elective cardioversion 12-24 hours prior to discharge. Will attempt to coordinate with the primary team.  3. Infectious process, clinically improving  4. Anticoagulation, will be continued and we will allow electrical cardioversion prior to discharge.   Signed, Sinclair Grooms, MD  12/24/2016, 8:43 AM

## 2016-12-24 NOTE — Progress Notes (Signed)
Per Dr. Thompson Caul request, DCCV scheduled for Thursday 3/1 at 2pm. Orders written including NPO after midnight Thurs for procedure and consent. Efton Thomley PA-C

## 2016-12-24 NOTE — Progress Notes (Signed)
Patient places self on and off CPAP when ready

## 2016-12-24 NOTE — Progress Notes (Signed)
[facility] PROGRESS NOTE  [name] OMV:[id number] DOB: Apr 26, 1941 DOA: 12/21/2016  PCP: Irven Shelling, MD  Brief History/Interval Summary: 76 year old Caucasian male with a past medical history of paroxysmal atrial fibrillation on amiodarone and was on anticoagulation, history of coronary artery disease, hypertension, presented with complaints of nausea, vomiting and diarrhea. Patient was noted to be hypotensive and tachycardic. He was in atrial fibrillation with RVR. He was hospitalized for further management. Cardiology was consulted due to persistent atrial fibrillation.  Reason for Visit: Atrial fibrillation with RVR. Gastroenteritis. Pneumonia, likely aspiration.  Consultants: Cardiology  Procedures: None  Antibiotics: Vancomycin, Zosyn and Levaquin initiated on 2/25  Subjective/Interval History: Patient says that he is feeling better. Cough is improved. Not short of breath. Had one episode of diarrhea yesterday but none since then. No nausea or vomiting.   ROS: Denies any abdominal pain.  Objective:  Vital Signs  Vitals:   12/23/16 2003 12/23/16 2010 12/23/16 2343 12/24/16 0356  BP: (!) 113/55  104/73 108/72  Pulse: (!) 120 (!) 113 98 (!) 105  Resp: (!) 26 (!) '26 18 19  '$ Temp: 97.2 F (36.2 C)  99 F (37.2 C) 98.9 F (37.2 C)  TempSrc: Oral  Oral Oral  SpO2:  94% 95% 93%  Weight:    94.3 kg (207 lb 14.4 oz)  Height:        Intake/Output Summary (Last 24 hours) at 12/24/16 0739 Last data filed at 12/24/16 0500  Gross per 24 hour  Intake             2033 ml  Output              950 ml  Net             1083 ml   Filed Weights   12/22/16 0106 12/22/16 0228 12/24/16 0356  Weight: 97.9 kg (215 lb 13.3 oz) 96.8 kg (213 lb 6.5 oz) 94.3 kg (207 lb 14.4 oz)    General appearance: alert, cooperative, appears stated age and no distress Resp: Improved air entry bilaterally. Fewer crackles left lung base. No wheezing, no rhonchi. Cardio: S1 S2 is  irregularly irregular. Tachycardic. No S3, S4. No rubs, murmurs, or bruit. No pedal edema. GI: soft, non-tender; bowel sounds normal; no masses,  no organomegaly Extremities: extremities normal, atraumatic, no cyanosis or edema Neurological: Patient is awake, alert. No facial asymmetry. Moving all his extremities. Motor strength is equal bilaterally.  Lab Results:  Data Reviewed: I have personally reviewed following labs and imaging studies  CBC:  Recent Labs Lab 12/21/16 1847 12/22/16 0248 12/22/16 1638 12/23/16 0132 12/24/16 0226  WBC 7.6 6.5 8.8 8.6 6.0  NEUTROABS 6.9  --  6.9  --   --   HGB 15.9 14.5 13.8 13.1 13.0  HCT 45.4 42.2 39.4 38.3* 38.1*  MCV 89.9 91.1 90.6 89.7 89.4  PLT 105* 108* 99* 99* 97*    Basic Metabolic Panel:  Recent Labs Lab 12/21/16 1847 12/22/16 0248 12/22/16 0859 12/23/16 0132 12/24/16 0226  NA 140  --  137 132* 138  K 3.9  --  3.8 3.5 3.7  CL 108  --  105 103 107  CO2 19*  --  23 21* 24  GLUCOSE 200*  --  182* 258* 206*  BUN 28*  --  24* 23* 12  CREATININE 0.86  --  1.04 1.21 0.88  CALCIUM 9.5  --  8.5* 8.3* 8.7*  MG  --  1.6*  --  2.0  --  GFR: Estimated Creatinine Clearance: 84 mL/min (by C-G formula based on SCr of 0.88 mg/dL).  Liver Function Tests:  Recent Labs Lab 12/21/16 1847  AST 26  ALT 22  ALKPHOS 54  BILITOT 1.0  PROT 6.9  ALBUMIN 3.9     Recent Labs Lab 12/21/16 1847  LIPASE 28   Cardiac Enzymes:  Recent Labs Lab 12/22/16 0259  TROPONINI 0.03*    CBG:  Recent Labs Lab 12/22/16 2146 12/23/16 0759 12/23/16 1222 12/23/16 1601 12/23/16 2150  GLUCAP 241* 158* 209* 199* 199*    Recent Results (from the past 240 hour(s))  MRSA PCR Screening     Status: None   Collection Time: 12/22/16  1:01 AM  Result Value Ref Range Status   MRSA by PCR NEGATIVE NEGATIVE Final    Comment:        The GeneXpert MRSA Assay (FDA approved for NASAL specimens only), is one component of a comprehensive MRSA  colonization surveillance program. It is not intended to diagnose MRSA infection nor to guide or monitor treatment for MRSA infections.   C difficile quick scan w PCR reflex     Status: None   Collection Time: 12/22/16  8:45 AM  Result Value Ref Range Status   C Diff antigen NEGATIVE NEGATIVE Final   C Diff toxin NEGATIVE NEGATIVE Final   C Diff interpretation No C. difficile detected.  Final  Culture, blood (Routine X 2) w Reflex to ID Panel     Status: None (Preliminary result)   Collection Time: 12/22/16  4:52 PM  Result Value Ref Range Status   Specimen Description BLOOD LEFT ANTECUBITAL  Final   Special Requests IN PEDIATRIC BOTTLE 2CC  Final   Culture NO GROWTH < 24 HOURS  Final   Report Status PENDING  Incomplete  Culture, blood (Routine X 2) w Reflex to ID Panel     Status: None (Preliminary result)   Collection Time: 12/22/16  5:00 PM  Result Value Ref Range Status   Specimen Description BLOOD BLOOD LEFT HAND  Final   Special Requests IN PEDIATRIC BOTTLE 3CC  Final   Culture NO GROWTH < 24 HOURS  Final   Report Status PENDING  Incomplete  Gastrointestinal Panel by PCR , Stool     Status: Abnormal   Collection Time: 12/23/16  8:45 AM  Result Value Ref Range Status   Campylobacter species NOT DETECTED NOT DETECTED Final   Plesimonas shigelloides NOT DETECTED NOT DETECTED Final   Salmonella species NOT DETECTED NOT DETECTED Final   Yersinia enterocolitica NOT DETECTED NOT DETECTED Final   Vibrio species NOT DETECTED NOT DETECTED Final   Vibrio cholerae NOT DETECTED NOT DETECTED Final   Enteroaggregative E coli (EAEC) NOT DETECTED NOT DETECTED Final   Enteropathogenic E coli (EPEC) NOT DETECTED NOT DETECTED Final   Enterotoxigenic E coli (ETEC) NOT DETECTED NOT DETECTED Final   Shiga like toxin producing E coli (STEC) NOT DETECTED NOT DETECTED Final   Shigella/Enteroinvasive E coli (EIEC) NOT DETECTED NOT DETECTED Final   Cryptosporidium NOT DETECTED NOT DETECTED Final     Cyclospora cayetanensis NOT DETECTED NOT DETECTED Final   Entamoeba histolytica NOT DETECTED NOT DETECTED Final   Giardia lamblia NOT DETECTED NOT DETECTED Final   Adenovirus F40/41 NOT DETECTED NOT DETECTED Final   Astrovirus NOT DETECTED NOT DETECTED Final   Norovirus GI/GII DETECTED (A) NOT DETECTED Final    Comment: CRITICAL RESULT CALLED TO, READ BACK BY AND VERIFIED WITH: BECKY ROCK AT 1432 ON  12/23/2016 JLJ    Rotavirus A NOT DETECTED NOT DETECTED Final   Sapovirus (I, II, IV, and V) NOT DETECTED NOT DETECTED Final      Radiology Studies: Dg Chest Port 1 View  Result Date: 12/22/2016 CLINICAL DATA:  Febrile, hypoxemia. History of hypertension and diabetes. EXAM: PORTABLE CHEST 1 VIEW COMPARISON:  Chest radiograph December 22, 2016 at 0131 hours FINDINGS: Cardiomediastinal silhouette is normal. Mildly calcified aortic knob. Patchy LEFT lung base airspace opacities are less conspicuous than prior radiograph. No pleural effusion. No pneumothorax. Soft tissue planes and included osseous structures are unchanged. IMPRESSION: LEFT lung base atelectasis versus pneumonia. Electronically Signed   By: Elon Alas M.D.   On: 12/22/2016 19:48     Medications:  Scheduled: . amiodarone  200 mg Oral Daily  . atorvastatin  10 mg Oral q1800  . guaiFENesin  600 mg Oral BID  . insulin aspart  0-15 Units Subcutaneous TID WC  . levofloxacin (LEVAQUIN) IV  750 mg Intravenous Q24H  . metoprolol tartrate  25 mg Oral BID  . multivitamin with minerals  1 tablet Oral Daily  . pantoprazole  40 mg Oral Daily  . piperacillin-tazobactam (ZOSYN)  IV  3.375 g Intravenous Q8H  . rivaroxaban  20 mg Oral Q supper  . saccharomyces boulardii  250 mg Oral BID  . sodium chloride flush  3 mL Intravenous Q12H  . vancomycin  1,000 mg Intravenous Q12H   Continuous:  YNW:[id number], diazepam, loperamide, ondansetron (ZOFRAN) IV, oxyCODONE-acetaminophen, promethazine  Assessment/Plan:  Principal  Problem:   Gastroenteritis and colitis, viral Active Problems:   DM (diabetes mellitus), type 2, uncontrolled w/neurologic complication (HCC)   Paroxysmal atrial fibrillation with RVR (HCC)   Chronic combined systolic and diastolic heart failure (HCC)   Nausea vomiting and diarrhea   Atrial fibrillation, rapid ([facility])    Atrial fibrillation with RVR Patient with known history of Paroxysmal atrial fibrillation. He is followed by Dr. Tamala Julian. He is on amiodarone and anticoagulation with Xarelto at home. Patient's current episode appeared to have been triggered by hypovolemia and acute illness. Patient was placed on a Cardizem infusion. His amiodarone was continued. Beta blocker was added on 2/25 once blood pressures stabilized. Patient remains in atrial fibrillation with RVR. Cardiology is following. He'll be given intravenous boluses of amiodarone. Patient was changed over to Xarelto from heparin. Plan is for cardioversion on 3/1.   Pneumonia, likely aspiration versus healthcare associated Patient developed fever. Initially low-grade and then he spiked to 102. Chest x-ray showed left-sided atelectasis versus infiltrate. He does have significant crackles on examination. So it is possible that he may have aspirated. Blood cultures and lactic acid levels were obtained. Lactic acid level was normal. Influenza was negative. C. difficile negative. Cultures are negative so far. Patient remains afebrile. WBCs normal. It is reasonable at this time to stop the vancomycin. Continue Levaquin and Zosyn for now. Could consider transitioning to Augmentin in the next 24-48 hours.  Acute gastroenteritis secondary to norovirus  GI pathogen panel revealed norovirus. Symptoms have improved. Stool studies negative for C. difficile. Symptomatic/supportive treatment.  History of diabetes mellitus type 2. CBGs are poorly controlled. Initiate Lantus. Holding patient's oral medications for now. Continue sliding scale  coverage. Check HbA1c.  History of Coronary artery disease Stable. Continue medications.  History of chronic systolic CHF Ejection fraction was 40% based on echocardiogram from 2016. Caution with IV fluids. No evidence for volume overload at this time.  History of abdominal aortic aneurysm. This is being  monitored in the outpatient setting.  DVT Prophylaxis: Back on Xarelto    Code Status: Full code  Family Communication: Discussed with patient. No family at bedside  Disposition Plan: Management as outlined above. Heart rate remains poorly controlled.    LOS: 1 day   [facility] Pager [phone] 12/24/2016, 7:39 AM  If 7PM-7AM, please contact night-coverage at [email or web address], password [facility]

## 2016-12-25 DIAGNOSIS — E1140 Type 2 diabetes mellitus with diabetic neuropathy, unspecified: Secondary | ICD-10-CM

## 2016-12-25 DIAGNOSIS — E1165 Type 2 diabetes mellitus with hyperglycemia: Secondary | ICD-10-CM

## 2016-12-25 DIAGNOSIS — R112 Nausea with vomiting, unspecified: Secondary | ICD-10-CM

## 2016-12-25 DIAGNOSIS — R197 Diarrhea, unspecified: Secondary | ICD-10-CM

## 2016-12-25 DIAGNOSIS — J181 Lobar pneumonia, unspecified organism: Secondary | ICD-10-CM

## 2016-12-25 DIAGNOSIS — R0902 Hypoxemia: Secondary | ICD-10-CM

## 2016-12-25 LAB — BASIC METABOLIC PANEL
Anion gap: 4 — ABNORMAL LOW (ref 5–15)
BUN: 9 mg/dL (ref 6–20)
CO2: 29 mmol/L (ref 22–32)
Calcium: 9.2 mg/dL (ref 8.9–10.3)
Chloride: 106 mmol/L (ref 101–111)
Creatinine, Ser: 0.89 mg/dL (ref 0.61–1.24)
GFR calc Af Amer: >60 mL/min (ref >60)
GFR calc non Af Amer: >60 mL/min (ref >60)
Glucose, Bld: 185 mg/dL — ABNORMAL HIGH (ref 65–99)
Potassium: 4.5 mmol/L (ref 3.5–5.1)
Sodium: 139 mmol/L (ref 135–145)

## 2016-12-25 LAB — CBC
HCT: 38.2 % — ABNORMAL LOW (ref 39.0–52.0)
Hemoglobin: 13.1 g/dL (ref 13.0–17.0)
MCH: 30.9 pg (ref 26.0–34.0)
MCHC: 34.3 g/dL (ref 30.0–36.0)
MCV: 90.1 fL (ref 78.0–100.0)
Platelets: 110 10*3/uL — ABNORMAL LOW (ref 150–400)
RBC: 4.24 MIL/uL (ref 4.22–5.81)
RDW: 13.4 % (ref 11.5–15.5)
WBC: 4.6 10*3/uL (ref 4.0–10.5)

## 2016-12-25 LAB — GLUCOSE, CAPILLARY
Glucose-Capillary: 200 mg/dL — ABNORMAL HIGH (ref 65–99)
Glucose-Capillary: 264 mg/dL — ABNORMAL HIGH (ref 65–99)
Glucose-Capillary: 224 mg/dL — ABNORMAL HIGH (ref 65–99)
Glucose-Capillary: 207 mg/dL — ABNORMAL HIGH (ref 65–99)

## 2016-12-25 MED ORDER — METOPROLOL TARTRATE 50 MG PO TABS
50.0000 mg | ORAL_TABLET | Freq: Two times a day (BID) | ORAL | Status: DC
  Administered 2016-12-25: 50 mg via ORAL
  Filled 2016-12-25 (×2): qty 1

## 2016-12-25 MED ORDER — SODIUM CHLORIDE 0.9% FLUSH
3.0000 mL | Freq: Two times a day (BID) | INTRAVENOUS | Status: DC
  Administered 2016-12-25: 3 mL via INTRAVENOUS

## 2016-12-25 MED ORDER — SODIUM CHLORIDE 0.9 % IV SOLN: 250.0000 mL | INTRAVENOUS | Status: DC

## 2016-12-25 MED ORDER — AMOXICILLIN-POT CLAVULANATE 875-125 MG PO TABS
1.0000 | ORAL_TABLET | Freq: Two times a day (BID) | ORAL | Status: DC
Start: 2016-12-25 — End: 2016-12-26
  Administered 2016-12-25 – 2016-12-26 (×3): 1 via ORAL
  Filled 2016-12-25 (×3): qty 1

## 2016-12-25 MED ORDER — SODIUM CHLORIDE 0.9% FLUSH: 3.0000 mL | INTRAVENOUS | Status: DC | PRN

## 2016-12-25 NOTE — Progress Notes (Signed)
Patient places self on and off of CPAP when ready. 

## 2016-12-25 NOTE — Progress Notes (Signed)
Pt refused standing weight this AM, stated he felt "too cozy to get up", weight obtained in the bed. NT discussed that patient will need to get on standing scale later this morning, pt stated he would be happy to comply later.

## 2016-12-25 NOTE — Progress Notes (Signed)
PROGRESS NOTE  [name]  VXB:[id number] DOB: 1941/03/17 DOA: 12/21/2016 PCP: Irven Shelling, MD Outpatient Specialists:  Subjective: Reported he is feeling much better, denies any complaints.  Brief Narrative:  76 year old Caucasian male with a past medical history of paroxysmal atrial fibrillation on amiodarone and was on anticoagulation, history of coronary artery disease, hypertension, presented with complaints of nausea, vomiting and diarrhea. Patient was noted to be hypotensive and tachycardic. He was in atrial fibrillation with RVR. He was hospitalized for further management. Cardiology was consulted due to persistent atrial fibrillation.  Assessment & Plan:   Principal Problem:   Gastroenteritis and colitis, viral Active Problems:   DM (diabetes mellitus), type 2, uncontrolled w/neurologic complication (HCC)   Paroxysmal atrial fibrillation with RVR (HCC)   Chronic combined systolic and diastolic heart failure (HCC)   Nausea vomiting and diarrhea   Atrial fibrillation, rapid (HCC)   Atrial fibrillation with rapid ventricular response (HCC)   Pneumonia of left lower lobe due to infectious organism [facility])   Atrial fibrillation with RVR -Patient with known history of Paroxysmal atrial fibrillation. He is followed by Dr. Tamala Julian.  -He is on amiodarone and anticoagulation with Xarelto at home.  -Patient's current episode appeared to have been triggered by hypovolemia and acute illness.  -Patient was placed on a Cardizem infusion. His amiodarone was continued. Beta blocker was added on 2/25. -Is still uncontrolled heart rate, patient will have DC synchronized CV tomorrow.  Pneumonia, likely aspiration versus healthcare associated -Patient developed fever. Initially low-grade and then he spiked to 102. CXR showed left-sided infiltrates versus infiltrates.  -Blood cultures and lactic acid negative. -Was on vancomycin and Zosyn, negative nasal MRSA swab, switched to  levofloxacin and Zosyn. -We'll switch to Augmentin  Acute gastroenteritis secondary to norovirus  -GI pathogen panel revealed norovirus. Symptoms have improved. Stool studies negative for C. difficile. -Symptomatic/supportive treatment with IV fluids and antiemetics.  History of diabetes mellitus type 2. CBGs are poorly controlled. Initiate Lantus. Holding patient's oral medications for now. Continue sliding scale coverage. Check HbA1c.  History of Coronary artery disease Stable. Continue medications.  History of chronic systolic CHF Ejection fraction was 40% based on echocardiogram from 2016. Caution with IV fluids. No evidence for volume overload at this time.  History of abdominal aortic aneurysm. This is being monitored in the outpatient setting.    DVT prophylaxis: Xarelto Code Status: Full Code Family Communication:  Disposition Plan:  Diet: Diet NPO time specified Except for: Sips with Meds Diet Carb Modified Fluid consistency: Thin; Room service appropriate? Yes Diet NPO time specified  Consultants:   Cards  Procedures:   None  Antimicrobials:   Levaquin and Zosyn--->> Augmentin  Objective: Vitals:   12/25/16 0300 12/25/16 0400 12/25/16 0500 12/25/16 0806  BP:  95/73  110/78  Pulse: 87 (!) 111  (!) 107  Resp: '14 17  17  '$ Temp: 98.4 F (36.9 C)   97.5 F (36.4 C)  TempSrc: Oral   Oral  SpO2: 97% 94%  97%  Weight:   95.8 kg (211 lb 1.6 oz)   Height:        Intake/Output Summary (Last 24 hours) at 12/25/16 1114 Last data filed at 12/25/16 0900  Gross per 24 hour  Intake             1983 ml  Output                0 ml  Net  1983 ml   Filed Weights   12/22/16 0228 12/24/16 0356 12/25/16 0500  Weight: 96.8 kg (213 lb 6.5 oz) 94.3 kg (207 lb 14.4 oz) 95.8 kg (211 lb 1.6 oz)    Examination: General exam: Appears calm and comfortable  Respiratory system: Clear to auscultation. Respiratory effort normal. Cardiovascular system: S1 & S2  heard, RRR. No JVD, murmurs, rubs, gallops or clicks. No pedal edema. Gastrointestinal system: Abdomen is nondistended, soft and nontender. No organomegaly or masses felt. Normal bowel sounds heard. Central nervous system: Alert and oriented. No focal neurological deficits. Extremities: Symmetric 5 x 5 power. Skin: No rashes, lesions or ulcers Psychiatry: Judgement and insight appear normal. Mood & affect appropriate.   Data Reviewed: I have personally reviewed following labs and imaging studies  CBC:  Recent Labs Lab 12/21/16 1847 12/22/16 0248 12/22/16 1638 12/23/16 0132 12/24/16 0226 12/25/16 0212  WBC 7.6 6.5 8.8 8.6 6.0 4.6  NEUTROABS 6.9  --  6.9  --   --   --   HGB 15.9 14.5 13.8 13.1 13.0 13.1  HCT 45.4 42.2 39.4 38.3* 38.1* 38.2*  MCV 89.9 91.1 90.6 89.7 89.4 90.1  PLT 105* 108* 99* 99* 97* 924*   Basic Metabolic Panel:  Recent Labs Lab 12/21/16 1847 12/22/16 0248 12/22/16 0859 12/23/16 0132 12/24/16 0226 12/25/16 0212  NA 140  --  137 132* 138 139  K 3.9  --  3.8 3.5 3.7 4.5  CL 108  --  105 103 107 106  CO2 19*  --  23 21* 24 29  GLUCOSE 200*  --  182* 258* 206* 185*  BUN 28*  --  24* 23* 12 9  CREATININE 0.86  --  1.04 1.21 0.88 0.89  CALCIUM 9.5  --  8.5* 8.3* 8.7* 9.2  MG  --  1.6*  --  2.0  --   --    GFR: Estimated Creatinine Clearance: 83.1 mL/min (by C-G formula based on SCr of 0.89 mg/dL). Liver Function Tests:  Recent Labs Lab 12/21/16 1847  AST 26  ALT 22  ALKPHOS 54  BILITOT 1.0  PROT 6.9  ALBUMIN 3.9    Recent Labs Lab 12/21/16 1847  LIPASE 28   No results for input(s): AMMONIA in the last 168 hours. Coagulation Profile: No results for input(s): INR, PROTIME in the last 168 hours. Cardiac Enzymes:  Recent Labs Lab 12/22/16 0259  TROPONINI 0.03*   BNP (last 3 results) No results for input(s): PROBNP in the last 8760 hours. HbA1C: No results for input(s): HGBA1C in the last 72 hours. CBG:  Recent Labs Lab  12/24/16 0753 12/24/16 1130 12/24/16 1623 12/24/16 2134 12/25/16 0811  GLUCAP 212* 302* 237* 186* 200*   Lipid Profile: No results for input(s): CHOL, HDL, LDLCALC, TRIG, CHOLHDL, LDLDIRECT in the last 72 hours. Thyroid Function Tests:  Recent Labs  12/24/16 0226  TSH 0.746   Anemia Panel: No results for input(s): VITAMINB12, FOLATE, FERRITIN, TIBC, IRON, RETICCTPCT in the last 72 hours. Urine analysis:    Component Value Date/Time   COLORURINE YELLOW 12/21/2016 [place] 12/21/2016 1850   LABSPEC 1.027 12/21/2016 1850   PHURINE 5.0 12/21/2016 1850   GLUCOSEU >=500 (A) 12/21/2016 1850   HGBUR MODERATE (A) 12/21/2016 1850   BILIRUBINUR NEGATIVE 12/21/2016 1850   KETONESUR 5 (A) 12/21/2016 1850   PROTEINUR NEGATIVE 12/21/2016 1850   UROBILINOGEN 0.2 12/12/2014 0150   NITRITE NEGATIVE 12/21/2016 [place] 12/21/2016 1850  Sepsis Labs: '@LABRCNTIP'$ (procalcitonin:4,lacticidven:4)  ) Recent Results (from the past 240 hour(s))  MRSA PCR Screening     Status: None   Collection Time: 12/22/16  1:01 AM  Result Value Ref Range Status   MRSA by PCR NEGATIVE NEGATIVE Final    Comment:        The GeneXpert MRSA Assay (FDA approved for NASAL specimens only), is one component of a comprehensive MRSA colonization surveillance program. It is not intended to diagnose MRSA infection nor to guide or monitor treatment for MRSA infections.   C difficile quick scan w PCR reflex     Status: None   Collection Time: 12/22/16  8:45 AM  Result Value Ref Range Status   C Diff antigen NEGATIVE NEGATIVE Final   C Diff toxin NEGATIVE NEGATIVE Final   C Diff interpretation No C. difficile detected.  Final  Culture, blood (Routine X 2) w Reflex to ID Panel     Status: None (Preliminary result)   Collection Time: 12/22/16  4:52 PM  Result Value Ref Range Status   Specimen Description BLOOD LEFT ANTECUBITAL  Final   Special Requests IN PEDIATRIC BOTTLE  2CC  Final   Culture NO GROWTH 2 DAYS  Final   Report Status PENDING  Incomplete  Culture, blood (Routine X 2) w Reflex to ID Panel     Status: None (Preliminary result)   Collection Time: 12/22/16  5:00 PM  Result Value Ref Range Status   Specimen Description BLOOD BLOOD LEFT HAND  Final   Special Requests IN PEDIATRIC BOTTLE 3CC  Final   Culture NO GROWTH 2 DAYS  Final   Report Status PENDING  Incomplete  Gastrointestinal Panel by PCR , Stool     Status: Abnormal   Collection Time: 12/23/16  8:45 AM  Result Value Ref Range Status   Campylobacter species NOT DETECTED NOT DETECTED Final   Plesimonas shigelloides NOT DETECTED NOT DETECTED Final   Salmonella species NOT DETECTED NOT DETECTED Final   Yersinia enterocolitica NOT DETECTED NOT DETECTED Final   Vibrio species NOT DETECTED NOT DETECTED Final   Vibrio cholerae NOT DETECTED NOT DETECTED Final   Enteroaggregative E coli (EAEC) NOT DETECTED NOT DETECTED Final   Enteropathogenic E coli (EPEC) NOT DETECTED NOT DETECTED Final   Enterotoxigenic E coli (ETEC) NOT DETECTED NOT DETECTED Final   Shiga like toxin producing E coli (STEC) NOT DETECTED NOT DETECTED Final   Shigella/Enteroinvasive E coli (EIEC) NOT DETECTED NOT DETECTED Final   Cryptosporidium NOT DETECTED NOT DETECTED Final   Cyclospora cayetanensis NOT DETECTED NOT DETECTED Final   Entamoeba histolytica NOT DETECTED NOT DETECTED Final   Giardia lamblia NOT DETECTED NOT DETECTED Final   Adenovirus F40/41 NOT DETECTED NOT DETECTED Final   Astrovirus NOT DETECTED NOT DETECTED Final   Norovirus GI/GII DETECTED (A) NOT DETECTED Final    Comment: CRITICAL RESULT CALLED TO, READ BACK BY AND VERIFIED WITH: BECKY ROCK AT 1432 ON 12/23/2016 JLJ    Rotavirus A NOT DETECTED NOT DETECTED Final   Sapovirus (I, II, IV, and V) NOT DETECTED NOT DETECTED Final     Invalid input(s): PROCALCITONIN, LACTICACIDVEN   Radiology Studies: No results found.      Scheduled Meds: .  amiodarone  400 mg Oral Daily  . atorvastatin  10 mg Oral q1800  . guaiFENesin  600 mg Oral BID  . insulin aspart  0-15 Units Subcutaneous TID WC  . insulin glargine  8 Units Subcutaneous Daily  . levofloxacin (LEVAQUIN) IV  750 mg Intravenous Q24H  . metoprolol tartrate  50 mg Oral BID  . multivitamin with minerals  1 tablet Oral Daily  . pantoprazole  40 mg Oral Daily  . piperacillin-tazobactam (ZOSYN)  IV  3.375 g Intravenous Q8H  . rivaroxaban  20 mg Oral Q supper  . saccharomyces boulardii  250 mg Oral BID  . sodium chloride flush  3 mL Intravenous Q12H   Continuous Infusions:   LOS: 2 days    Time spent: 35 minutes    Bryden Darden A, MD [facility] Pager [phone]  If 7PM-7AM, please contact night-coverage [email or web address] Password [email or web address] 12/25/2016, 11:14 AM

## 2016-12-25 NOTE — Progress Notes (Signed)
Progress Note  Patient Name: [name] Date of Encounter: 12/25/2016  Primary Cardiologist: Linard Millers  Subjective   No cardiac complaints. Noro virus improving symptomatically. Sitting at bedside eating breakfast. Had a good night.  Inpatient Medications    Scheduled Meds: . amiodarone  400 mg Oral Daily  . atorvastatin  10 mg Oral q1800  . guaiFENesin  600 mg Oral BID  . insulin aspart  0-15 Units Subcutaneous TID WC  . insulin glargine  8 Units Subcutaneous Daily  . levofloxacin (LEVAQUIN) IV  750 mg Intravenous Q24H  . metoprolol tartrate  25 mg Oral BID  . multivitamin with minerals  1 tablet Oral Daily  . pantoprazole  40 mg Oral Daily  . piperacillin-tazobactam (ZOSYN)  IV  3.375 g Intravenous Q8H  . rivaroxaban  20 mg Oral Q supper  . saccharomyces boulardii  250 mg Oral BID  . sodium chloride flush  3 mL Intravenous Q12H   Continuous Infusions:  PRN Meds: acetaminophen, diazepam, loperamide, ondansetron (ZOFRAN) IV, oxyCODONE-acetaminophen, promethazine   Vital Signs    Vitals:   12/25/16 0300 12/25/16 0400 12/25/16 0500 12/25/16 0806  BP:  95/73  110/78  Pulse: 87 (!) 111  (!) 107  Resp: '14 17  17  '$ Temp: 98.4 F (36.9 C)   97.5 F (36.4 C)  TempSrc: Oral   Oral  SpO2: 97% 94%  97%  Weight:   211 lb 1.6 oz (95.8 kg)   Height:        Intake/Output Summary (Last 24 hours) at 12/25/16 0935 Last data filed at 12/25/16 0900  Gross per 24 hour  Intake             3103 ml  Output                0 ml  Net             3103 ml   Filed Weights   12/22/16 0228 12/24/16 0356 12/25/16 0500  Weight: 213 lb 6.5 oz (96.8 kg) 207 lb 14.4 oz (94.3 kg) 211 lb 1.6 oz (95.8 kg)    Telemetry    Atrial fibrillation with poor rate control.Ventricular response greater than 130 bpm when standing and moving. No significant ventricular ectopy. - Personally Reviewed  ECG    Atrial fibrillation, rapid ventricular response. Incomplete right bundle branch block and  evidence of old inferior infarct. - Personally Reviewed  Physical Exam  Lying flat and comfortable. GEN: No acute distress.   Neck: No JVD Cardiac: Rapid IIR, no murmurs, rubs, or gallops.  Respiratory: Clear to auscultation bilaterally. GI: Soft, nontender, non-distended  MS: No edema; No deformity. Neuro:  Nonfocal  Psych: Normal affect   Labs    Chemistry Recent Labs Lab 12/21/16 1847  12/23/16 0132 12/24/16 0226 12/25/16 0212  NA 140  < > 132* 138 139  K 3.9  < > 3.5 3.7 4.5  CL 108  < > 103 107 106  CO2 19*  < > 21* 24 29  GLUCOSE 200*  < > 258* 206* 185*  BUN 28*  < > 23* 12 9  CREATININE 0.86  < > 1.21 0.88 0.89  CALCIUM 9.5  < > 8.3* 8.7* 9.2  PROT 6.9  --   --   --   --   ALBUMIN 3.9  --   --   --   --   AST 26  --   --   --   --  ALT 22  --   --   --   --   ALKPHOS 54  --   --   --   --   BILITOT 1.0  --   --   --   --   GFRNONAA >60  < > 56* >60 >60  GFRAA >60  < > >60 >60 >60  ANIONGAP 13  < > 8 7 4*  < > = values in this interval not displayed.   Hematology  Recent Labs Lab 12/23/16 0132 12/24/16 0226 12/25/16 0212  WBC 8.6 6.0 4.6  RBC 4.27 4.26 4.24  HGB 13.1 13.0 13.1  HCT 38.3* 38.1* 38.2*  MCV 89.7 89.4 90.1  MCH 30.7 30.5 30.9  MCHC 34.2 34.1 34.3  RDW 13.8 13.7 13.4  PLT 99* 97* 110*    Cardiac Enzymes  Recent Labs Lab 12/22/16 0259  TROPONINI 0.03*   No results for input(s): TROPIPOC in the last 168 hours.   BNP  Recent Labs Lab 12/22/16 0259  BNP 272.9*     DDimer No results for input(s): DDIMER in the last 168 hours.   Radiology    No results found.  Echocardiogram4/16/16:  Study Conclusions  - Left ventricle: The cavity size was normal. Systolic function was mildly to moderately reduced. The estimated ejection fraction was in the range of 40% to 45%. Diffuse hypokinesis. There is hypokinesis of the inferior myocardium. Doppler parameters are consistent with abnormal left ventricular relaxation  (grade 1 diastolic dysfunction). - Aorta: Aortic root dimension: 40 mm (ED). - Ascending aorta: The ascending aorta was mildly dilated. - Left atrium: The atrium was moderately dilated.  Impressions:  - EF is mildly improved when compared to prior echocardiogram.  Patient Profile     76 y.o. 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  ASSESSMENT:  1. Coronary artery disease with prior myocardial infarction 3, chronic occlusion of the RCA, LAD and circumflex stents, with most recent PCI in 2012. No evidence of infarction or acute ischemia on this occasion.  2. Acute illness related paroxysmal atrial fibrillation- extremely poor rate control. The current regimen includes amiodarone 400 mg daily and metoprolol. Will increase metoprolol back to maintenance dose of 50 mg twice a day. Plan elective electrocardioversion at 2 PM tomorrow.  3. Infectious process, clinically improving  4. Anticoagulation, will be continued and we will allow electrical cardioversion prior to discharge.   Signed, Sinclair Grooms, MD  12/25/2016, 9:35 AM

## 2016-12-25 NOTE — Progress Notes (Signed)
Inpatient Diabetes Program Recommendations  AACE/ADA: New Consensus Statement on Inpatient Glycemic Control (2015)  Target Ranges:  Prepandial:   less than 140 mg/dL      Peak postprandial:   less than 180 mg/dL (1-2 hours)      Critically ill patients:  140 - 180 mg/dL   Review of Glycemic Control Results for SANAD, FEARNOW (MRN [id number]) as of 12/25/2016 09:19  Ref. Range 12/24/2016 07:53 12/24/2016 11:30 12/24/2016 16:23 12/24/2016 21:34 12/25/2016 08:11  Glucose-Capillary Latest Ref Range: 65 - 99 mg/dL 212 (H) 302 (H) 237 (H) 186 (H) 200 (H)   Diabetes history: DM2 Outpatient Diabetes medications: Metformin 1000 mg bid +Amaryl 2 mg qd + Jardiance 10 mg qd Current orders for Inpatient glycemic control: Lantus 8 units + Novolog correction 0-15 units tid  Inpatient Diabetes Program Recommendations: Patient has received total of 21 units correction + 8 units basal/24 hrs. Noted patient NPO after MN for procedure tomorrow. Please consider for today: -Novolog 4 units meal coverage tid if eats 50 %  Thank you, Bethena Roys E. Reyna Lorenzi, RN, MSN, CDE Inpatient Glycemic Control Team Team Pager [phone] (8am-5pm) 12/25/2016 9:43 AM

## 2016-12-26 ENCOUNTER — Inpatient Hospital Stay (HOSPITAL_COMMUNITY): Payer: Medicare Other | Admitting: Anesthesiology

## 2016-12-26 ENCOUNTER — Encounter (HOSPITAL_COMMUNITY): Payer: Self-pay

## 2016-12-26 ENCOUNTER — Encounter (HOSPITAL_COMMUNITY)
Admission: EM | Disposition: A | Discharge status: Home / Self Care | Payer: Self-pay | Source: Home / Self Care | Attending: Internal Medicine

## 2016-12-26 ENCOUNTER — Other Ambulatory Visit: Payer: Medicare Other

## 2016-12-26 HISTORY — PX: CARDIOVERSION: SHX1299

## 2016-12-26 LAB — HEMOGLOBIN A1C
Hgb A1c MFr Bld: 7.3 % — ABNORMAL HIGH (ref 4.8–5.6)
Mean Plasma Glucose: 163 mg/dL

## 2016-12-26 LAB — GLUCOSE, CAPILLARY
Glucose-Capillary: 227 mg/dL — ABNORMAL HIGH (ref 65–99)
Glucose-Capillary: 240 mg/dL — ABNORMAL HIGH (ref 65–99)

## 2016-12-26 SURGERY — CARDIOVERSION
Anesthesia: General

## 2016-12-26 MED ORDER — LIDOCAINE HCL (CARDIAC) 20 MG/ML IV SOLN
INTRAVENOUS | Status: DC | PRN
  Administered 2016-12-26: 100 mg via INTRATRACHEAL

## 2016-12-26 MED ORDER — PROPOFOL 10 MG/ML IV BOLUS
INTRAVENOUS | Status: DC | PRN
  Administered 2016-12-26: 60 mg via INTRAVENOUS

## 2016-12-26 MED ORDER — SODIUM CHLORIDE 0.9 % IV SOLN
INTRAVENOUS | Status: DC | PRN
  Administered 2016-12-26: 09:00:00 via INTRAVENOUS

## 2016-12-26 MED ORDER — AMOXICILLIN-POT CLAVULANATE 875-125 MG PO TABS: 1.0000 | ORAL_TABLET | Freq: Two times a day (BID) | ORAL | 0 refills | Status: DC

## 2016-12-26 NOTE — Interval H&P Note (Signed)
History and Physical Interval Note:  12/26/2016 9:42 AM  [name]  has presented today for surgery, with the diagnosis of afib  The various methods of treatment have been discussed with the patient and family. After consideration of risks, benefits and other options for treatment, the patient has consented to  Procedure(s): CARDIOVERSION (N/A) as a surgical intervention .  The patient's history has been reviewed, patient examined, no change in status, stable for surgery.  I have reviewed the patient's chart and labs.  Questions were answered to the patient's satisfaction.     Fransico Him

## 2016-12-26 NOTE — H&P (View-Only) (Signed)
The patient has been seen in conjunction with [name], PA-C. All aspects of care have been considered and discussed. The patient has been personally interviewed, examined, and all clinical data has been reviewed.   [name] is well known to me with known multivessel coronary disease, chronic combined systolic and diastolic heart failure, history of paroxysmal atrial fibrillation usually maintaining rhythm on oral amiodarone, prior electrical cardioversions have been required, hypertension, chronic kidney disease, and abdominal aortic aneurysm repair. Also has a history of sleep apnea. Admitted with infectious process and noted to be an atrial fib with rapid rate.  He appears very stable at this time. Blood pressure is soft and about 98/60 mmHg. Heart rate 88 and in atrial fibrillation.  Overall impression is that of acute atrial fibrillation, exacerbated/precipitated by acute infectious illness.  Plan is to increase amiodarone to 400 mg daily. Give IV amiodarone bolus if increased rate and consider IV amiodarone infusion if needed. Continue oral beta blocker therapy. May need cardioversion prior to discharge. Continue anticoagulation.   Cardiology Consultation Note    Patient ID: [name], MRN: [id number], DOB/AGE: 04/20/1941 76 y.o. Admit date: 12/21/2016   Date of Consult: 12/23/2016 Primary Physician: Irven Shelling, MD Primary Cardiologist: Dr. Tamala Julian  Chief Complaint: weakness, nausea, vomiting, diarrhea, fever Reason for Consultation: atrial fib RVR Requesting MD: Dr. Maryland Pink  HPI: [name] is a 76 y.o. 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 He was admited  12/21/16 with 1-day history of weakness, fever of 102.1 at home, and large amounts of nausea, vomiting, and diarrhea. In the ED he was hypotensive and tachycardic into the 150s (AF) warranting 3L of IVF. He subsequently developed an oxygen requirement. Given his persistent tachycardia, IM discussed with cardiology fellow who advised IV metoprolol and cardizem drip. In total he's gotten 3 doses of IV metoprolol in addition to his scheduled oral metoprolol '25mg'$  BID.  He was ultimately diagnosed with LLL PNA as well as gastroenteritis and started on abx. Gastro panel pending. Flu and C diff neg. Trop 0.03. Otherwise labs notable for plt 99 (c/w prior), Na 132, current BUN/Cr 23/1.21, Mg 1.6 s/p repletion yesterday. He could tell "something was off" with his heart rhythm when all this started but is unaware of his atrial fib right now, for example. Denies CP, SOB, syncope, GI bleeding. Still had some diarrhea this AM. Rate now 90s. No known sick contacts. Dilt drip is now off.   Past Medical History:  Diagnosis Date  . AAA (abdominal aortic aneurysm) ([place])    a. s/p repair 2009.  [name] Acid reflux    takes Prilosec and Protonix daily  . Arthritis    BacK  . CAD in native artery    a. a. prior inf MI 1993. b. PCI to LAD 05/1997. c. recurrent inferolateral MI complicated by V. fib arrest in 04/1998, prior PCI to OM1. d. 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  [name] Chronic combined systolic and diastolic CHF (congestive heart failure) ([place])   . Complication of anesthesia    -years ago hair fell out.;ileus after 2 of his surgeries  . Dry skin   . Foot drop, bilateral   .  Hypercholesteremia   . Hypertension   . Ileus ([place])    After AAA  . Incisional hernia    "small, from AAA"  . Ischemic cardiomyopathy   . MI (myocardial infarction)   . Neuropathy ([facility])    in feet & legs  . OSA on CPAP    uses CPAP--sleep study done at least 53yr ago  . PA[place] ([place]mal [place]) [place]   . Pain    back pain  chronic- seen at pain clinic  . Plantar fasciitis    bilatetral  . Type II diabetes mellitus ([place]       Surgical History:  Past Surgical History:  Procedure Laterality Date  . ABDOMINAL AORTIC ANEURYSM REPAIR    . BACK SURGERY  2012-2013 X 3   [place]iminal Invasive x 3in [place].  .Marland KitchenCARDIAC CATHETERIZATION  2009/2012  . CARDIOVERSION N/A 04/12/2015   Procedure: CARDIOVERSION;  Surgeon: TSueanne Margarita MD;  Location: [facility];  Service: Cardiovascular;  Laterality: N/A;  . COLONOSCOPY    . CORONARY ANGIOPLASTY WITH STENT PLACEMENT  1992; 1997  . LAPAROSCOPIC CHOLECYSTECTOMY    . LUMBAR LAMINECTOMY/DECOMPRESSION MICRODISCECTOMY Right 01/16/2016   Procedure: Right Lumbar five-Sacral one Laminectomy;  Surgeon: HKristeen Miss MD;  [facility];  Service: Neurosurgery;  Laterality: Right;  Right L5-S1 Laminectomy     Home Meds: Prior to Admission medications   Medication Sig Start Date End Date Taking? Authorizing Provider  amiodarone (PACERONE) 200 MG tablet TAKE ONE TABLET BY MOUTH ONCE DAILY 04/29/16  Yes HBelva Crome MD  atorvastatin (LIPITOR) 10 MG tablet Take 10 mg by mouth daily.   Yes Historical Provider, MD  Coenzyme Q10 (CO Q 10) 100 MG CAPS Take 100 mg by mouth daily.   Yes Historical Provider, MD  diazepam (VALIUM) 5 MG tablet Take 5 mg by mouth 2 (two) times daily as needed (leg cramps/ sleep).  10/31/15  Yes Historical Provider, MD  empagliflozin (JARDIANCE) 10 MG TABS tablet Take 10 mg by mouth daily with breakfast.    Yes Historical Provider, MD  glimepiride (AMARYL) 2 MG tablet Take 2 mg by mouth daily with breakfast.  01/10/15  Yes Historical Provider, MD  metFORMIN (GLUCOPHAGE-XR) 500 MG 24 hr tablet Take 1,000 mg by mouth 2 (two) times daily.  10/12/15  Yes Historical Provider, MD  metoprolol (LOPRESSOR) 50 MG tablet TAKE ONE TABLET BY MOUTH TWICE DAILY 11/18/16  Yes HBelva Crome MD  Multiple Vitamin (MULTIVITAMIN WITH MINERALS) TABS Take 1 tablet by mouth daily.    Yes Historical Provider, MD  omeprazole (PRILOSEC) 20 MG capsule Take 20 mg by mouth daily. 09/14/12  Yes Historical Provider, MD  oxyCODONE-acetaminophen (PERCOCET/ROXICET) 5-325 MG tablet Take 1 tablet by mouth every 6 (six) hours as needed (pain).    Yes Historical Provider, MD  pyridOXINE (VITAMIN B-6) 100 MG tablet Take 100 mg by mouth daily.   Yes Historical Provider, MD  vitamin B-12 (CYANOCOBALAMIN) 1000 MCG tablet Take 1,000 mcg by mouth daily.    Yes Historical Provider, MD  XARELTO 20 MG TABS tablet TAKE ONE TABLET BY MOUTH ONCE DAILY WITH SUPPER 09/27/16  Yes HBelva Crome MD    Inpatient Medications:  . amiodarone  200 mg Oral Daily  . atorvastatin  10 mg Oral q1800  . insulin aspart  0-15 Units Subcutaneous TID WC  . levofloxacin (LEVAQUIN) IV  750 mg Intravenous Q24H  . metoprolol tartrate  25 mg Oral BID  . multivitamin with minerals  1 tablet Oral Daily  . pantoprazole  40 mg Oral Daily  . piperacillin-tazobactam (ZOSYN)  IV  3.375 g Intravenous Q8H  . saccharomyces boulardii  250 mg Oral BID  . sodium chloride flush  3 mL Intravenous Q12H  . vancomycin  1,000 mg Intravenous Q12H   . diltiazem (CARDIZEM) infusion Stopped (12/23/16 0139)  . heparin 1,300 Units/hr (12/23/16 0800)    Allergies:  Allergies  Allergen Reactions  . Cymbalta [Duloxetine Hcl] Nausea And Vomiting and Other (See Comments)    Rapid drop in blood pressure  . Neurontin [Gabapentin] Other (See Comments)    Makes me goofy, keeps me off balance, clouds my thinking  . Lyrica [Pregabalin] Other (See Comments)    Makes me goofy, keeps me off balance, clouds my thinking  . Keppra [Levetiracetam] Nausea And Vomiting    Social History   Social History  . Marital status: Married    Spouse name: N/A  . Number of children: N/A  . Years of education: N/A   Occupational History  . Not on file.   Social History Main Topics  . Smoking status: Former Smoker    Packs/day: 1.00    Years: 33.00     Types: Cigarettes    Quit date: 01/01/1992  . Smokeless tobacco: Never Used  . Alcohol use Yes     Comment: 6/30;2017 "GLASS OF WINE Q COUPLE MONTHS, IF THAT"  . Drug use: No  . Sexual activity: Yes   Other Topics Concern  . Not on file   Social History Narrative  . No narrative on file     Family History  Problem Relation Age of Onset  . Diabetes Mother   . Coronary artery disease Mother   . Diabetes Father   . Arthritis Father   . Rheum arthritis Father   . Coronary artery disease Father      Review of Systems: All other systems reviewed and are otherwise negative except as noted above.  Labs:  Recent Labs  12/22/16 0259  TROPONINI 0.03*   Lab Results  Component Value Date   WBC 8.6 12/23/2016   HGB 13.1 12/23/2016   HCT 38.3 (L) 12/23/2016   MCV 89.7 12/23/2016   PLT 99 (L) 12/23/2016    Recent Labs Lab 12/21/16 1847  12/23/16 0132  NA 140  < > 132*  K 3.9  < > 3.5  CL 108  < > 103  CO2 19*  < > 21*  BUN 28*  < > 23*  CREATININE 0.86  < > 1.21  CALCIUM 9.5  < > 8.3*  PROT 6.9  --   --   BILITOT 1.0  --   --   ALKPHOS 54  --   --   ALT 22  --   --   AST 26  --   --   GLUCOSE 200*  < > 258*  < > = values in this interval not displayed.    Radiology/Studies:  Dg Chest Port 1 View  Result Date: 12/22/2016 CLINICAL DATA:  Febrile, hypoxemia. History of hypertension and diabetes. EXAM: PORTABLE CHEST 1 VIEW COMPARISON:  Chest radiograph December 22, 2016 at 0131 hours FINDINGS: Cardiomediastinal silhouette is normal. Mildly calcified aortic knob. Patchy LEFT lung base airspace opacities are less conspicuous than prior radiograph. No pleural effusion. No pneumothorax. Soft tissue planes and included osseous structures are unchanged. IMPRESSION: LEFT lung base atelectasis versus pneumonia. Electronically Signed   By: Elon Alas M.D.   On:  12/22/2016 19:48   Dg Chest Portable 1 View  Result Date: 12/21/2016 CLINICAL DATA:  Nausea and vomiting.  EXAM: PORTABLE CHEST 1 VIEW COMPARISON:  04/26/16 FINDINGS: Mild cardiac enlargement. Lung volumes are low. Atelectasis is identified within the left lower lobe. No airspace opacities identified. IMPRESSION: 1. Left lower lobe atelectasis Electronically Signed   By: Kerby Moors M.D.   On: 12/21/2016 19:41   Dg Chest Port 1v Same Day  Result Date: 12/22/2016 CLINICAL DATA:  Initial evaluation for acute hypoxia. EXAM: PORTABLE CHEST 1 VIEW COMPARISON:  Prior radiograph from 12/21/2016. FINDINGS: Moderate cardiomegaly. Mediastinal silhouette within normal limits. Aortic atherosclerosis. Lungs hypoinflated. Persistent patchy opacity at the left lung base, either atelectasis or infiltrate. Mild perihilar vascular congestion without overt pulmonary edema. No pleural effusion. No pneumothorax. Osseous structures unchanged. IMPRESSION: 1. Persistent patchy left basilar opacity, which may reflect atelectasis and/or infiltrate. 2. Stable cardiomegaly with mild perihilar vascular congestion without overt pulmonary edema. 3. Aortic atherosclerosis. Electronically Signed   By: Jeannine Boga M.D.   On: 12/22/2016 03:03    Wt Readings from Last 3 Encounters:  12/22/16 213 lb 6.5 oz (96.8 kg)  06/25/16 204 lb 6.4 oz (92.7 kg)  04/26/16 198 lb 1.6 oz (89.9 kg)    EKG: atrial fib 159bpm, old inferior MI, nonspecific ST-T Changes  Physical Exam: Blood pressure 98/83, pulse (!) 115, temperature 99.1 F (37.3 C), temperature source Oral, resp. rate (!) 21, height '6\' 3"'$  (1.905 m), weight 213 lb 6.5 oz (96.8 kg), SpO2 98 %. Body mass index is 26.67 kg/m. General: Well developed, well nourished WM, in no acute distress. Laying 20 degrees in bed without signifcant dyspnea. Head: Normocephalic, atraumatic, sclera non-icteric, no xanthomas, nares are without discharge.  Neck: Negative for carotid bruits. JVD not elevated. Lungs: Clear bilaterally to auscultation without wheezes, rales, or rhonchi. Breathing is  unlabored. Heart: Irregularly irregular, rate upper limits of normal, with S1 S2. No murmurs, rubs, or gallops appreciated. Abdomen: Soft, non-tender, non-distended with normoactive bowel sounds. No hepatomegaly. No rebound/guarding. No obvious abdominal masses. Msk:  Strength and tone appear normal for age. Extremities: No clubbing or cyanosis. No edema.  Distal pedal pulses are 2+ and equal bilaterally. Neuro: Alert and oriented X 3. No facial asymmetry. No focal deficit. Moves all extremities spontaneously. Psych:  Responds to questions appropriately with a normal affect.     Assessment and Plan  43M with PAF (on Xarelto and amiodarone, last DCCV 03/2015), CAD (prior inf MI 1993, PCI to LAD 05/1997, recurrent inferolateral MI complicated by V. fib arrest in 04/1998, prior PCI to OM1, known CTO of RCA by cath 10/2010), ischemic cardiomyopathy, chronic combined CHF, HTN, AAA s/p repair in 2009, HLD, OSA, DM, chronic pain, severe polyneuropathy. Admitted with acute gastroenteritis with SIRS features (tachycardia, hypotension), also being treated for PNA with fever and abnormal CXR. Cardiology consulted for AF RVR.  1. Gastroenteritis and possible CAP - per IM.  2. Atrial fib RVR - likely reactive to #1. Rate is presently improved following the above measures. Dilt drip has been turned off overnight. Right now he's currently on metoprolol '25mg'$  BID and amiodarone '200mg'$  daily with HR 90s - rate seems appropriate for level of illness. Patient currently unaware of his rhythm. He could be having PAF outside of the hospital but in general has maintained NSR by last few EKGs in 2017. His last DCCV was in 2016. Would allow him to recover from his acute illness and either consider DCCV prior to  DC if he does not convert, or have him f/u in the office to determine if he's still in atrial fib and then go forward. He is currently on heparin. Would change back to NOAC when IM feels is safe. Check TSH with AM labs given  amio.  3. CAD - minimally elevated trop on admission likely demand in nature. Continue surveillance for any angina.  4. Chronic combined CHF - appears euvolemic right now. Follow volume status with strict I/O's and daily weights. S/p IVF replacement. Would caution against any standing fluids right now unless he begins actively vomiting again.  5. Hypomagnesemia - likely due to GI losses; improved after repletion. F/u again in AM.  Signed, Charlie Pitter PA-C 12/23/2016, 10:25 AM Pager: [phone]

## 2016-12-26 NOTE — Progress Notes (Signed)
Pt ambulated in the hallway post cardioversion. Denied SOB, dizziness, or pain. Remained in NSR with HR 90's. Resting HR 70's.

## 2016-12-26 NOTE — Discharge Summary (Signed)
Physician Discharge Summary  [name] HAL:[id number] DOB: Feb 24, 1941 DOA: 12/21/2016  PCP: Irven Shelling, MD  Admit date: 12/21/2016 Discharge date: 12/26/2016  Admitted From: Home Disposition: Home  Recommendations for Outpatient Follow-up:  1. Follow up with PCP in 1-2 weeks 2. Please obtain BMP/CBC in one week  Home Health: NA Equipment/Devices:NA  Discharge Condition: Stable CODE STATUS: Full Code Diet recommendation: Diet heart healthy/carb modified Room service appropriate? Yes; Fluid consistency: Thin Diet - low sodium heart healthy  Brief/Interim Summary: 76 year old Caucasian male with a past medical history of paroxysmal atrial fibrillation on amiodarone and was on anticoagulation, history of coronary artery disease, hypertension, presented with complaints of nausea, vomiting and diarrhea. Patient was noted to be hypotensive and tachycardic. He was in atrial fibrillation with RVR. He was hospitalized for further management.Cardiology was consulted due to persistent atrial fibrillation.  Discharge Diagnoses:  Principal Problem:   Gastroenteritis and colitis, viral Active Problems:   DM (diabetes mellitus), type 2, uncontrolled w/neurologic complication (HCC)   Paroxysmal atrial fibrillation with RVR (HCC)   Chronic combined systolic and diastolic heart failure (HCC)   Nausea vomiting and diarrhea   Atrial fibrillation, rapid (HCC)   Atrial fibrillation with rapid ventricular response (HCC)   Pneumonia of left lower lobe due to infectious organism ([place])   Hypoxia   Atrial fibrillation with RVR -Patient with known history of Paroxysmal atrial fibrillation. He is followed by Dr. Tamala Julian.  -He is on amiodarone, Metoprolol and anticoagulation with Xarelto at home.  -CHA2DS2-VASc of 6 secondary to age, DM, CAD and CHF. -Patient's current episode appeared to have been triggered by hypovolemia and acute illness.  -Initially was on Cardizem infusion and his amiodarone  increase. -Seen by cardiology, direct-current cardioversion was done earlier today on 12/26/16. -Patient ambulated very well, he will as converted to sinus rhythm after the DCCV.  Pneumonia, likely aspiration versus healthcare associated -Patient developed fever. Initially low-grade and then he spiked to 102. CXR showed left-sided infiltrates versus infiltrates.  -Blood cultures and lactic acid negative. -Was on vancomycin and Zosyn, negative nasal MRSA swab, switched to levofloxacin and Zosyn. -Discharged on Augmentin for 5 more days.  Acute gastroenteritissecondary to norovirus  -GI pathogen panel revealed norovirus.Symptoms have improved. Stool studies negative for C. difficile. -Symptomatic/supportivetreatment with IV fluids and antiemetics. This is resolved tolerating diet without problems.  History of diabetes mellitus type 2. CBGs are poorly controlled. Initiate Lantus. Holding patient's oral medications for now. Continue sliding scale coverage. Check HbA1c.  History of Coronary artery disease Stable. Continue medications.  History of chronic systolic CHF Ejection fraction was 40% based on echocardiogram from 2016. Caution with IV fluids. No evidence for volume overload at this time.  History of abdominal aortic aneurysm. This is being monitored in the outpatient setting.   Discharge Instructions  Discharge Instructions    Diet - low sodium heart healthy    Complete by:  As directed    Increase activity slowly    Complete by:  As directed      Allergies as of 12/26/2016      Reactions   Cymbalta [duloxetine Hcl] Nausea And Vomiting, Other (See Comments)   Rapid drop in blood pressure   Neurontin [gabapentin] Other (See Comments)   Makes me goofy, keeps me off balance, clouds my thinking   Lyrica [pregabalin] Other (See Comments)   Makes me goofy, keeps me off balance, clouds my thinking   Keppra [levetiracetam] Nausea And Vomiting      Medication List  TAKE these medications   amiodarone 200 MG tablet Commonly known as:  PACERONE TAKE ONE TABLET BY MOUTH ONCE DAILY   amoxicillin-clavulanate 875-125 MG tablet Commonly known as:  AUGMENTIN Take 1 tablet by mouth every 12 (twelve) hours.   atorvastatin 10 MG tablet Commonly known as:  LIPITOR Take 10 mg by mouth daily.   Co Q 10 100 MG Caps Take 100 mg by mouth daily.   diazepam 5 MG tablet Commonly known as:  VALIUM Take 5 mg by mouth 2 (two) times daily as needed (leg cramps/ sleep).   glimepiride 2 MG tablet Commonly known as:  AMARYL Take 2 mg by mouth daily with breakfast.   JARDIANCE 10 MG Tabs tablet Generic drug:  empagliflozin Take 10 mg by mouth daily with breakfast.   metFORMIN 500 MG 24 hr tablet Commonly known as:  GLUCOPHAGE-XR Take 1,000 mg by mouth 2 (two) times daily.   metoprolol 50 MG tablet Commonly known as:  LOPRESSOR TAKE ONE TABLET BY MOUTH TWICE DAILY   multivitamin with minerals Tabs tablet Take 1 tablet by mouth daily.   omeprazole 20 MG capsule Commonly known as:  PRILOSEC Take 20 mg by mouth daily.   oxyCODONE-acetaminophen 5-325 MG tablet Commonly known as:  PERCOCET/ROXICET Take 1 tablet by mouth every 6 (six) hours as needed (pain).   pyridOXINE 100 MG tablet Commonly known as:  VITAMIN B-6 Take 100 mg by mouth daily.   vitamin B-12 1000 MCG tablet Commonly known as:  CYANOCOBALAMIN Take 1,000 mcg by mouth daily.   XARELTO 20 MG Tabs tablet Generic drug:  rivaroxaban TAKE ONE TABLET BY MOUTH ONCE DAILY WITH SUPPER       Allergies  Allergen Reactions  . Cymbalta [Duloxetine Hcl] Nausea And Vomiting and Other (See Comments)    Rapid drop in blood pressure  . Neurontin [Gabapentin] Other (See Comments)    Makes me goofy, keeps me off balance, clouds my thinking  . Lyrica [Pregabalin] Other (See Comments)    Makes me goofy, keeps me off balance, clouds my thinking  . Keppra [Levetiracetam] Nausea And Vomiting     Consultations:  Treatment Team:   Rounding Lbcardiology, MD   Procedures (Echo, Carotid, EGD, Colonoscopy, ERCP)   Radiological studies: Dg Chest Port 1 View  Result Date: 12/22/2016 CLINICAL DATA:  Febrile, hypoxemia. History of hypertension and diabetes. EXAM: PORTABLE CHEST 1 VIEW COMPARISON:  Chest radiograph December 22, 2016 at 0131 hours FINDINGS: Cardiomediastinal silhouette is normal. Mildly calcified aortic knob. Patchy LEFT lung base airspace opacities are less conspicuous than prior radiograph. No pleural effusion. No pneumothorax. Soft tissue planes and included osseous structures are unchanged. IMPRESSION: LEFT lung base atelectasis versus pneumonia. Electronically Signed   By: Elon Alas M.D.   On: 12/22/2016 19:48   Dg Chest Portable 1 View  Result Date: 12/21/2016 CLINICAL DATA:  Nausea and vomiting. EXAM: PORTABLE CHEST 1 VIEW COMPARISON:  04/26/16 FINDINGS: Mild cardiac enlargement. Lung volumes are low. Atelectasis is identified within the left lower lobe. No airspace opacities identified. IMPRESSION: 1. Left lower lobe atelectasis Electronically Signed   By: Kerby Moors M.D.   On: 12/21/2016 19:41   Dg Chest Port 1v Same Day  Result Date: 12/22/2016 CLINICAL DATA:  Initial evaluation for acute hypoxia. EXAM: PORTABLE CHEST 1 VIEW COMPARISON:  Prior radiograph from 12/21/2016. FINDINGS: Moderate cardiomegaly. Mediastinal silhouette within normal limits. Aortic atherosclerosis. Lungs hypoinflated. Persistent patchy opacity at the left lung base, either atelectasis or infiltrate. Mild perihilar vascular congestion  without overt pulmonary edema. No pleural effusion. No pneumothorax. Osseous structures unchanged. IMPRESSION: 1. Persistent patchy left basilar opacity, which may reflect atelectasis and/or infiltrate. 2. Stable cardiomegaly with mild perihilar vascular congestion without overt pulmonary edema. 3. Aortic atherosclerosis. Electronically Signed   By:  Jeannine Boga M.D.   On: 12/22/2016 03:03     Subjective:  Discharge Exam: Vitals:   12/26/16 1000 12/26/16 1005 12/26/16 1010 12/26/16 1209  BP: 107/61  102/63 116/71  Pulse: 75  67 69  Resp: '20  18 12  '$ Temp:   97.6 F (36.4 C) 97.8 F (36.6 C)  TempSrc:   Oral Oral  SpO2: 95% 97% 97% 97%  Weight:      Height:       General: Pt is alert, awake, not in acute distress Cardiovascular: RRR, S1/S2 +, no rubs, no gallops Respiratory: CTA bilaterally, no wheezing, no rhonchi Abdominal: Soft, NT, ND, bowel sounds + Extremities: no edema, no cyanosis   The results of significant diagnostics from this hospitalization (including imaging, microbiology, ancillary and laboratory) are listed below for reference.    Microbiology: Recent Results (from the past 240 hour(s))  MRSA PCR Screening     Status: None   Collection Time: 12/22/16  1:01 AM  Result Value Ref Range Status   MRSA by PCR NEGATIVE NEGATIVE Final    Comment:        The GeneXpert MRSA Assay (FDA approved for NASAL specimens only), is one component of a comprehensive MRSA colonization surveillance program. It is not intended to diagnose MRSA infection nor to guide or monitor treatment for MRSA infections.   C difficile quick scan w PCR reflex     Status: None   Collection Time: 12/22/16  8:45 AM  Result Value Ref Range Status   C Diff antigen NEGATIVE NEGATIVE Final   C Diff toxin NEGATIVE NEGATIVE Final   C Diff interpretation No C. difficile detected.  Final  Culture, blood (Routine X 2) w Reflex to ID Panel     Status: None (Preliminary result)   Collection Time: 12/22/16  4:52 PM  Result Value Ref Range Status   Specimen Description BLOOD LEFT ANTECUBITAL  Final   Special Requests IN PEDIATRIC BOTTLE 2CC  Final   Culture NO GROWTH 4 DAYS  Final   Report Status PENDING  Incomplete  Culture, blood (Routine X 2) w Reflex to ID Panel     Status: None (Preliminary result)   Collection Time: 12/22/16   5:00 PM  Result Value Ref Range Status   Specimen Description BLOOD BLOOD LEFT HAND  Final   Special Requests IN PEDIATRIC BOTTLE 3CC  Final   Culture NO GROWTH 4 DAYS  Final   Report Status PENDING  Incomplete  Gastrointestinal Panel by PCR , Stool     Status: Abnormal   Collection Time: 12/23/16  8:45 AM  Result Value Ref Range Status   Campylobacter species NOT DETECTED NOT DETECTED Final   Plesimonas shigelloides NOT DETECTED NOT DETECTED Final   Salmonella species NOT DETECTED NOT DETECTED Final   Yersinia enterocolitica NOT DETECTED NOT DETECTED Final   Vibrio species NOT DETECTED NOT DETECTED Final   Vibrio cholerae NOT DETECTED NOT DETECTED Final   Enteroaggregative E coli (EAEC) NOT DETECTED NOT DETECTED Final   Enteropathogenic E coli (EPEC) NOT DETECTED NOT DETECTED Final   Enterotoxigenic E coli (ETEC) NOT DETECTED NOT DETECTED Final   Shiga like toxin producing E coli (STEC) NOT DETECTED NOT DETECTED Final  Shigella/Enteroinvasive E coli (EIEC) NOT DETECTED NOT DETECTED Final   Cryptosporidium NOT DETECTED NOT DETECTED Final   Cyclospora cayetanensis NOT DETECTED NOT DETECTED Final   Entamoeba histolytica NOT DETECTED NOT DETECTED Final   Giardia lamblia NOT DETECTED NOT DETECTED Final   Adenovirus F40/41 NOT DETECTED NOT DETECTED Final   Astrovirus NOT DETECTED NOT DETECTED Final   Norovirus GI/GII DETECTED (A) NOT DETECTED Final    Comment: CRITICAL RESULT CALLED TO, READ BACK BY AND VERIFIED WITH: BECKY ROCK AT 1432 ON 12/23/2016 JLJ    Rotavirus A NOT DETECTED NOT DETECTED Final   Sapovirus (I, II, IV, and V) NOT DETECTED NOT DETECTED Final     Labs: BNP (last 3 results)  Recent Labs  12/22/16 0259  BNP 154.0*   Basic Metabolic Panel:  Recent Labs Lab 12/21/16 1847 12/22/16 0248 12/22/16 0859 12/23/16 0132 12/24/16 0226 12/25/16 0212  NA 140  --  137 132* 138 139  K 3.9  --  3.8 3.5 3.7 4.5  CL 108  --  105 103 107 106  CO2 19*  --  23 21* 24 29   GLUCOSE 200*  --  182* 258* 206* 185*  BUN 28*  --  24* 23* 12 9  CREATININE 0.86  --  1.04 1.21 0.88 0.89  CALCIUM 9.5  --  8.5* 8.3* 8.7* 9.2  MG  --  1.6*  --  2.0  --   --    Liver Function Tests:  Recent Labs Lab 12/21/16 1847  AST 26  ALT 22  ALKPHOS 54  BILITOT 1.0  PROT 6.9  ALBUMIN 3.9    Recent Labs Lab 12/21/16 1847  LIPASE 28   No results for input(s): AMMONIA in the last 168 hours. CBC:  Recent Labs Lab 12/21/16 1847 12/22/16 0248 12/22/16 1638 12/23/16 0132 12/24/16 0226 12/25/16 0212  WBC 7.6 6.5 8.8 8.6 6.0 4.6  NEUTROABS 6.9  --  6.9  --   --   --   HGB 15.9 14.5 13.8 13.1 13.0 13.1  HCT 45.4 42.2 39.4 38.3* 38.1* 38.2*  MCV 89.9 91.1 90.6 89.7 89.4 90.1  PLT 105* 108* 99* 99* 97* 110*   Cardiac Enzymes:  Recent Labs Lab 12/22/16 0259  TROPONINI 0.03*   BNP: Invalid input(s): POCBNP CBG:  Recent Labs Lab 12/25/16 1141 12/25/16 1707 12/25/16 2203 12/26/16 0813 12/26/16 1208  GLUCAP 264* 224* 207* 227* 240*   D-Dimer No results for input(s): DDIMER in the last 72 hours. Hgb A1c  Recent Labs  12/25/16 0212  HGBA1C 7.3*   Lipid Profile No results for input(s): CHOL, HDL, LDLCALC, TRIG, CHOLHDL, LDLDIRECT in the last 72 hours. Thyroid function studies  Recent Labs  12/24/16 0226  TSH 0.746   Anemia work up No results for input(s): VITAMINB12, FOLATE, FERRITIN, TIBC, IRON, RETICCTPCT in the last 72 hours. Urinalysis    Component Value Date/Time   COLORURINE YELLOW 12/21/2016 1850   APPEARANCEUR CLEAR 12/21/2016 1850   LABSPEC 1.027 12/21/2016 1850   PHURINE 5.0 12/21/2016 1850   GLUCOSEU >=500 (A) 12/21/2016 1850   HGBUR MODERATE (A) 12/21/2016 1850   BILIRUBINUR NEGATIVE 12/21/2016 1850   KETONESUR 5 (A) 12/21/2016 1850   PROTEINUR NEGATIVE 12/21/2016 1850   UROBILINOGEN 0.2 12/12/2014 0150   NITRITE NEGATIVE 12/21/2016 1850   LEUKOCYTESUR NEGATIVE 12/21/2016 1850   Sepsis Labs Invalid input(s):  PROCALCITONIN,  WBC,  LACTICIDVEN Microbiology Recent Results (from the past 240 hour(s))  MRSA PCR Screening  Status: None   Collection Time: 12/22/16  1:01 AM  Result Value Ref Range Status   MRSA by PCR NEGATIVE NEGATIVE Final    Comment:        The GeneXpert MRSA Assay (FDA approved for NASAL specimens only), is one component of a comprehensive MRSA colonization surveillance program. It is not intended to diagnose MRSA infection nor to guide or monitor treatment for MRSA infections.   C difficile quick scan w PCR reflex     Status: None   Collection Time: 12/22/16  8:45 AM  Result Value Ref Range Status   C Diff antigen NEGATIVE NEGATIVE Final   C Diff toxin NEGATIVE NEGATIVE Final   C Diff interpretation No C. difficile detected.  Final  Culture, blood (Routine X 2) w Reflex to ID Panel     Status: None (Preliminary result)   Collection Time: 12/22/16  4:52 PM  Result Value Ref Range Status   Specimen Description BLOOD LEFT ANTECUBITAL  Final   Special Requests IN PEDIATRIC BOTTLE 2CC  Final   Culture NO GROWTH 4 DAYS  Final   Report Status PENDING  Incomplete  Culture, blood (Routine X 2) w Reflex to ID Panel     Status: None (Preliminary result)   Collection Time: 12/22/16  5:00 PM  Result Value Ref Range Status   Specimen Description BLOOD BLOOD LEFT HAND  Final   Special Requests IN PEDIATRIC BOTTLE 3CC  Final   Culture NO GROWTH 4 DAYS  Final   Report Status PENDING  Incomplete  Gastrointestinal Panel by PCR , Stool     Status: Abnormal   Collection Time: 12/23/16  8:45 AM  Result Value Ref Range Status   Campylobacter species NOT DETECTED NOT DETECTED Final   Plesimonas shigelloides NOT DETECTED NOT DETECTED Final   Salmonella species NOT DETECTED NOT DETECTED Final   Yersinia enterocolitica NOT DETECTED NOT DETECTED Final   Vibrio species NOT DETECTED NOT DETECTED Final   Vibrio cholerae NOT DETECTED NOT DETECTED Final   Enteroaggregative E coli (EAEC)  NOT DETECTED NOT DETECTED Final   Enteropathogenic E coli (EPEC) NOT DETECTED NOT DETECTED Final   Enterotoxigenic E coli (ETEC) NOT DETECTED NOT DETECTED Final   Shiga like toxin producing E coli (STEC) NOT DETECTED NOT DETECTED Final   Shigella/Enteroinvasive E coli (EIEC) NOT DETECTED NOT DETECTED Final   Cryptosporidium NOT DETECTED NOT DETECTED Final   Cyclospora cayetanensis NOT DETECTED NOT DETECTED Final   Entamoeba histolytica NOT DETECTED NOT DETECTED Final   Giardia lamblia NOT DETECTED NOT DETECTED Final   Adenovirus F40/41 NOT DETECTED NOT DETECTED Final   Astrovirus NOT DETECTED NOT DETECTED Final   Norovirus GI/GII DETECTED (A) NOT DETECTED Final    Comment: CRITICAL RESULT CALLED TO, READ BACK BY AND VERIFIED WITH: BECKY ROCK AT 1432 ON 12/23/2016 JLJ    Rotavirus A NOT DETECTED NOT DETECTED Final   Sapovirus (I, II, IV, and V) NOT DETECTED NOT DETECTED Final     Time coordinating discharge: Over 30 minutes  SIGNED:   Birdie Hopes, MD  [facility] 12/26/2016, 1:17 PM Pager   If 7PM-7AM, please contact night-coverage [email or web address] Password [email or web address]

## 2016-12-26 NOTE — Anesthesia Postprocedure Evaluation (Signed)
Anesthesia Post Note  Patient: [name]  Procedure(s) Performed: Procedure(s) (LRB): CARDIOVERSION (N/A)  Patient location during evaluation: PACU Anesthesia Type: General Level of consciousness: sedated and patient cooperative Pain management: pain level controlled Vital Signs Assessment: post-procedure vital signs reviewed and stable Respiratory status: spontaneous breathing Cardiovascular status: stable Anesthetic complications: no       Last Vitals:  Vitals:   12/26/16 1000 12/26/16 1010  BP: 107/61 102/63  Pulse: 75 67  Resp: 20 18  Temp:  36.4 C    Last Pain:  Vitals:   12/26/16 1010  TempSrc: Oral  PainSc:                  Nolon Nations

## 2016-12-26 NOTE — CV Procedure (Signed)
   Electrical Cardioversion Procedure Note [name] [id number] 1941/05/20  Procedure: Electrical Cardioversion Indications:  Atrial Fibrillation  Time Out: Verified patient identification, verified procedure,medications/allergies/relevent history reviewed, required imaging and test results available.  Performed  Procedure Details  The patient was NPO after midnight. Anesthesia was administered at the beside  by Dr.Germeroth with '60mg'$  of propofol and '100mg'$  of Lidocaine.  Cardioversion was done with synchronized biphasic defibrillation with AP pads with 150watts.  The patient converted to normal sinus rhythm. The patient tolerated the procedure well   IMPRESSION:  Successful cardioversion of atrial fibrillation    [name] 12/26/2016, 9:43 AM

## 2016-12-26 NOTE — Transfer of Care (Signed)
Immediate Anesthesia Transfer of Care Note  Patient: [name]  Procedure(s) Performed: Procedure(s): CARDIOVERSION (N/A)  Patient Location: PACU and Endoscopy Unit  Anesthesia Type:General  Level of Consciousness: awake, alert  and oriented  Airway & Oxygen Therapy: Patient Spontanous Breathing  Post-op Assessment: Post -op Vital signs reviewed and stable  Post vital signs: Reviewed  Last Vitals:  Vitals:   12/26/16 1000 12/26/16 1010  BP: 107/61 102/63  Pulse: 75 67  Resp: 20 18  Temp:  36.4 C    Last Pain:  Vitals:   12/26/16 1010  TempSrc: Oral  PainSc:          Complications: No apparent anesthesia complications

## 2016-12-26 NOTE — Anesthesia Preprocedure Evaluation (Addendum)
Anesthesia Evaluation  Patient identified by MRN, date of birth, ID band Patient awake    Reviewed: Allergy & Precautions, NPO status , Patient's Chart, lab work & pertinent test results, reviewed documented beta blocker date and time   History of Anesthesia Complications (+) history of anesthetic complications  Airway Mallampati: I  TM Distance: >3 FB Neck ROM: Full    Dental  (+) Upper Dentures, Lower Dentures   Pulmonary sleep apnea and Continuous Positive Airway Pressure Ventilation , pneumonia, former smoker,    breath sounds clear to auscultation       Cardiovascular hypertension, Pt. on medications and Pt. on home beta blockers + CAD, + Past MI, + Peripheral Vascular Disease and +CHF  + dysrhythmias Atrial Fibrillation  Rhythm:Irregular  Echo 2016 - Left ventricle: The cavity size was normal. Systolic function wasmildly to moderately reduced. The estimated ejection fraction wasin the range of 40% to 45%. Diffuse hypokinesis. There ishypokinesis of the inferior myocardium. Doppler parameters areconsistent with abnormal left ventricular relaxation (grade 1diastolic dysfunction). - Aorta: Aortic root dimension: 40 mm (ED). - Ascending aorta: The ascending aorta was mildly dilated. - Left atrium: The atrium was moderately dilated.  Impressions: - EF is mildly improved when compared to prior echocardiogram.   Neuro/Psych    GI/Hepatic GERD  ,  Endo/Other  diabetes, Type 2, Oral Hypoglycemic Agents  Renal/GU      Musculoskeletal  (+) Arthritis ,   Abdominal   Peds  Hematology   Anesthesia Other Findings   Reproductive/Obstetrics                            Anesthesia Physical  Anesthesia Plan  ASA: III  Anesthesia Plan: General   Post-op Pain Management:    Induction: Intravenous  Airway Management Planned: Mask  Additional Equipment:   Intra-op Plan:   Post-operative  Plan:   Informed Consent: I have reviewed the patients History and Physical, chart, labs and discussed the procedure including the risks, benefits and alternatives for the proposed anesthesia with the patient or authorized representative who has indicated his/her understanding and acceptance.   Dental advisory given  Plan Discussed with: CRNA  Anesthesia Plan Comments:         Anesthesia Quick Evaluation

## 2016-12-26 NOTE — Progress Notes (Signed)
Progress Note  Patient Name: [name] Date of Encounter: 12/26/2016  Primary Cardiologist: Dr. Daneen Schick  Subjective   Patient is feeling well. No SOB, palpitations or CP. He was cardioverted this morning and continues to remain in sinus rhythm.   Inpatient Medications    Scheduled Meds: . amiodarone  400 mg Oral Daily  . amoxicillin-clavulanate  1 tablet Oral Q12H  . atorvastatin  10 mg Oral q1800  . guaiFENesin  600 mg Oral BID  . insulin aspart  0-15 Units Subcutaneous TID WC  . insulin glargine  8 Units Subcutaneous Daily  . metoprolol tartrate  50 mg Oral BID  . multivitamin with minerals  1 tablet Oral Daily  . pantoprazole  40 mg Oral Daily  . rivaroxaban  20 mg Oral Q supper  . saccharomyces boulardii  250 mg Oral BID  . sodium chloride flush  3 mL Intravenous Q12H  . sodium chloride flush  3 mL Intravenous Q12H   Continuous Infusions: . sodium chloride     PRN Meds: acetaminophen, diazepam, loperamide, ondansetron (ZOFRAN) IV, oxyCODONE-acetaminophen, promethazine, sodium chloride flush   Vital Signs    Vitals:   12/26/16 0958 12/26/16 1000 12/26/16 1005 12/26/16 1010  BP:  107/61  102/63  Pulse: 71 75  67  Resp: (!) '22 20  18  '$ Temp:    97.6 F (36.4 C)  TempSrc:    Oral  SpO2: 96% 95% 97% 97%  Weight:      Height:        Intake/Output Summary (Last 24 hours) at 12/26/16 1112 Last data filed at 12/26/16 1017  Gross per 24 hour  Intake              300 ml  Output                0 ml  Net              300 ml   Filed Weights   12/24/16 0356 12/25/16 0500 12/26/16 0500  Weight: 207 lb 14.4 oz (94.3 kg) 211 lb 1.6 oz (95.8 kg) 204 lb 9.6 oz (92.8 kg)    Telemetry    NSR, HR 78-82 - Personally Reviewed  ECG    HR 74, NSR  Post cardioversion EKG - Personally Reviewed  Physical Exam   GEN: No acute distress, eating a tangerine and resting comfortably  Neck: No JVD Cardiac: RRR, no murmurs, rubs, or gallops.  Respiratory: Clear to  auscultation bilaterally. GI: Soft, nontender, non-distended  MS: No edema; No deformity. Neuro:  Nonfocal  Psych: Normal affect   Labs    Chemistry Recent Labs Lab 12/21/16 1847  12/23/16 0132 12/24/16 0226 12/25/16 0212  NA 140  < > 132* 138 139  K 3.9  < > 3.5 3.7 4.5  CL 108  < > 103 107 106  CO2 19*  < > 21* 24 29  GLUCOSE 200*  < > 258* 206* 185*  BUN 28*  < > 23* 12 9  CREATININE 0.86  < > 1.21 0.88 0.89  CALCIUM 9.5  < > 8.3* 8.7* 9.2  PROT 6.9  --   --   --   --   ALBUMIN 3.9  --   --   --   --   AST 26  --   --   --   --   ALT 22  --   --   --   --   [facility]  54  --   --   --   --   BILITOT 1.0  --   --   --   --   GFRNONAA >60  < > 56* >60 >60  GFRAA >60  < > >60 >60 >60  ANIONGAP 13  < > 8 7 4*  < > = values in this interval not displayed.   Hematology Recent Labs Lab 12/23/16 0132 12/24/16 0226 12/25/16 0212  WBC 8.6 6.0 4.6  RBC 4.27 4.26 4.24  HGB 13.1 13.0 13.1  HCT 38.3* 38.1* 38.2*  MCV 89.7 89.4 90.1  MCH 30.7 30.5 30.9  MCHC 34.2 34.1 34.3  RDW 13.8 13.7 13.4  PLT 99* 97* 110*   Cardiac Enzymes Recent Labs Lab 12/22/16 0259  TROPONINI 0.03*    BNP Recent Labs Lab 12/22/16 0259  BNP 272.9*     Cardiac Studies   Echocardiogram4/16/16:  Study Conclusions  - Left ventricle: The cavity size was normal. Systolic function was mildly to moderately reduced. The estimated ejection fraction was in the range of 40% to 45%. Diffuse hypokinesis. There is hypokinesis of the inferior myocardium. Doppler parameters are consistent with abnormal left ventricular relaxation (grade 1 diastolic dysfunction). - Aorta: Aortic root dimension: 40 mm (ED). - Ascending aorta: The ascending aorta was mildly dilated. - Left atrium: The atrium was moderately dilated.  Impressions:  - EF is mildly improved when compared to prior echocardiogram.  Patient Profile     76 y.o. male Coronary artery disease, prior inferior and lateral acute  infarctions (PCI 2; 1993 and 1999), LAD PCI/stent 1998, known chronic occlusion of the RCA, chronic diastolic heart failure with acute exacerbations when in atrial fibrillation, history of ventricular fibrillation (1999), chronic combined systolic and diastolic heart failure, history of paroxysmal atrial fibrillation with most recent cardioversion 11/2014, rhythm control managed with amiodarone therapy, chronic anticoagulation with Xarelto, and history of abdominal aortic aneurysm repair 2009. 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   Assessment & Plan     1 . Acute illness related paroxysmal atrial fibrillation- extremely poor rate control.- Successful elective cardioversion this morning. Pt remains in NSR and asymptomatic. Discharge on Amiodarone 200 mg daily and maintenance dose of Metoprolol 50 mg BID. If patient is feeling well and able to ambulate he is okay for discharge from a cardiology standpoint.  2. Coronary artery disease with prior myocardial infarction 3, chronic occlusion of the RCA, LAD and circumflex stents, with most recent PCI in 2012. No evidence of infarction or acute ischemia on this occasion.  3. Infectious process, clinically improving  4. Anticoagulation, Successful elective cardioversion this morning. Pt remains in NSR and asymptomatic. Continue anticoagulation.  Signed, Linus Mako, PA-C  12/26/2016, 11:12 AM    The patient has been seen in conjunction with Delos Haring, PA-C. All aspects of care have been considered and discussed. The patient has been personally interviewed, examined, and all clinical data has been reviewed.   Resume prehospitalization cardiac therapy. Continue amiodarone at 200 mg per day.  If ambulates without significant blood pressure/hemodynamic issues, it would be okay to discharge the patient from the cardiac  viewpoint. Please let us know if further concerns or questions.

## 2016-12-27 ENCOUNTER — Encounter (HOSPITAL_COMMUNITY): Payer: Self-pay | Admitting: Cardiology

## 2016-12-27 LAB — CULTURE, BLOOD (ROUTINE X 2)
Culture: NO GROWTH
Culture: NO GROWTH

## 2017-01-06 ENCOUNTER — Ambulatory Visit (INDEPENDENT_AMBULATORY_CARE_PROVIDER_SITE_OTHER): Payer: Medicare Other | Admitting: Nurse Practitioner

## 2017-01-06 ENCOUNTER — Encounter: Payer: Self-pay | Admitting: Nurse Practitioner

## 2017-01-06 ENCOUNTER — Telehealth: Payer: Self-pay | Admitting: *Deleted

## 2017-01-06 VITALS — BP 128/68 | HR 76 | Ht 72.0 in | Wt 206.8 lb

## 2017-01-06 DIAGNOSIS — Z9889 Other specified postprocedural states: Secondary | ICD-10-CM | POA: Diagnosis not present

## 2017-01-06 DIAGNOSIS — Z9289 Personal history of other medical treatment: Secondary | ICD-10-CM

## 2017-01-06 DIAGNOSIS — I480 Paroxysmal atrial fibrillation: Secondary | ICD-10-CM

## 2017-01-06 DIAGNOSIS — I10 Essential (primary) hypertension: Secondary | ICD-10-CM | POA: Diagnosis not present

## 2017-01-06 DIAGNOSIS — I2510 Atherosclerotic heart disease of native coronary artery without angina pectoris: Secondary | ICD-10-CM

## 2017-01-06 DIAGNOSIS — I5042 Chronic combined systolic (congestive) and diastolic (congestive) heart failure: Secondary | ICD-10-CM

## 2017-01-06 NOTE — Telephone Encounter (Signed)
Called lvm to move pt to 11:45 am today due to inclement weather.

## 2017-01-06 NOTE — Progress Notes (Signed)
CARDIOLOGY OFFICE NOTE  Date:  01/06/2017    [name] Date of Birth: 16-Mar-1941 Medical Record #[id number]  PCP:  Irven Shelling, MD  Cardiologist:  Tamala Julian  Chief Complaint  Patient presents with  . Atrial Fibrillation    Post hospital visit - seen for Dr. Tamala Julian    History of Present Illness: [name] is a 76 y.o. male who presents today for a post hospital visit. This was to be a TOC - however, no phone call documented.   He has a past medical history of paroxysmal atrial fibrillation on amiodarone and is on chronic anticoagulation with Xarelto, CAD with prior PCI, & hypertension.   Last seen in August of 2017 and felt to be doing ok.   Presented towards the end of February with complaints of nausea, vomiting and diarrhea. Patient was noted to be hypotensive and tachycardic. He was in atrial fibrillation with RVR. He was hospitalized for further management.Found to have pneumonia as well as Noro virus. Was cardioverted during his admission back to NSR.   Comes in today. Here with his wife. He is feeling much better. Saw Dr. Laurann Montana last week and going back in about 2 weeks. Has had some lab done with Dr. Laurann Montana. His rhythm has been ok. Breathing is stable. No chest pain. No swelling. Seems more limited by his neuropathy. No falls. Remains on his Xarelto.   Past Medical History:  Diagnosis Date  . AAA (abdominal aortic aneurysm) ([place])    a. s/p repair 2009.  [name] Acid reflux    takes Prilosec and Protonix daily  . Arthritis    BacK  . CAD in native artery    a. a. prior inf MI 1993. b. PCI to LAD 05/1997. c. recurrent inferolateral MI complicated by V. fib arrest in 04/1998, prior PCI to OM1. d. 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  [name] Chronic combined systolic and diastolic CHF (congestive heart failure) ([place])   . Complication of anesthesia    -years ago hair fell out.;ileus after 2 of his surgeries  . Dry skin   . Foot drop, bilateral   . Hypercholesteremia   .  Hypertension   . Ileus ([place])    After AAA  . Incisional hernia    "small, from AAA"  . Ischemic cardiomyopathy   . MI (myocardial infarction)   . Neuropathy ([facility])    in feet & legs  . OSA on CPAP    uses CPAP--sleep study done at least 52yr ago  . PAF (paroxysmal atrial fibrillation) ([facility]   . Pain    back pain chronic- seen at pain clinic  . Plantar fasciitis    bilatetral  . Thrombocytopenia ([facility]   . Type II diabetes mellitus ([facility]     Past Surgical History:  Procedure Laterality Date  . ABDOMINAL AORTIC ANEURYSM REPAIR    . BACK SURGERY  2012-2013 X 3   [place]iminal Invasive x 3in [place].  .Marland KitchenCARDIAC CATHETERIZATION  2009/2012  . CARDIOVERSION N/A 04/12/2015   Procedure: CARDIOVERSION;  Surgeon: TSueanne Margarita MD;  [facility]  Service: Cardiovascular;  Laterality: N/A;  . CARDIOVERSION N/A 12/26/2016   Procedure: CARDIOVERSION;  Surgeon: TSueanne Margarita MD;  Location[facility];  Service: Cardiovascular;  Laterality: N/A;  . COLONOSCOPY    . CORONARY ANGIOPLASTY WITH STENT PLACEMENT  1992; 1997  . LAPAROSCOPIC CHOLECYSTECTOMY    . LUMBAR LAMINECTOMY/DECOMPRESSION MICRODISCECTOMY Right 01/16/2016   Procedure: Right Lumbar five-Sacral one  Laminectomy;  Surgeon: Kristeen Miss, MD;  Location: [place] [facility];  Service: Neurosurgery;  Laterality: Right;  Right L5-S1 Laminectomy     Medications: Current Outpatient Prescriptions  Medication Sig Dispense Refill  . atorvastatin (LIPITOR) 10 MG tablet Take 10 mg by mouth daily.    . Coenzyme Q10 (CO Q 10) 100 MG CAPS Take 100 mg by mouth daily.    . diazepam (VALIUM) 5 MG tablet Take 5 mg by mouth 2 (two) times daily as needed (leg cramps/ sleep).     . empagliflozin (JARDIANCE) 10 MG TABS tablet Take 10 mg by mouth daily with breakfast.     . glimepiride (AMARYL) 2 MG tablet Take 2 mg by mouth daily with breakfast.     . metFORMIN (GLUCOPHAGE-XR) 500 MG 24 hr tablet Take 1,000 mg by mouth 2 (two) times daily.     .  metoprolol (LOPRESSOR) 50 MG tablet Take 50 mg by mouth 2 (two) times daily.    . Multiple Vitamin (MULTIVITAMIN WITH MINERALS) TABS Take 1 tablet by mouth daily.    [name] omeprazole (PRILOSEC) 20 MG capsule Take 20 mg by mouth daily.    [name] oxyCODONE-acetaminophen (PERCOCET/ROXICET) 5-325 MG tablet Take 1 tablet by mouth every 6 (six) hours as needed (pain).     . ramipril (ALTACE) 10 MG capsule Take 10 mg by mouth as needed (for bp above 130).    . vitamin B-12 (CYANOCOBALAMIN) 1000 MCG tablet Take 1,000 mcg by mouth daily.     Alveda Reasons 20 MG TABS tablet TAKE ONE TABLET BY MOUTH ONCE DAILY WITH SUPPER 90 tablet 2   No current facility-administered medications for this visit.     Allergies: Allergies  Allergen Reactions  . Cymbalta [Duloxetine Hcl] Nausea And Vomiting and Other (See Comments)    Rapid drop in blood pressure  . Neurontin [Gabapentin] Other (See Comments)    Makes me goofy, keeps me off balance, clouds my thinking  . Lyrica [Pregabalin] Other (See Comments)    Makes me goofy, keeps me off balance, clouds my thinking  . Keppra [Levetiracetam] Nausea And Vomiting    Social History: The patient  reports that he quit smoking about 25 years ago. His smoking use included Cigarettes. He has a 33.00 pack-year smoking history. He has never used smokeless tobacco. He reports that he drinks alcohol. He reports that he does not use drugs.   Family History: The patient's family history includes Arthritis in his father; Coronary artery disease in his father and mother; Diabetes in his father and mother; Rheum arthritis in his father.   Review of Systems: Please see the history of present illness.   Otherwise, the review of systems is positive for none.   All other systems are reviewed and negative.   Physical Exam: VS:  BP 128/68   Pulse 76   Ht 6' (1.829 m)   Wt 206 lb 12.8 oz (93.8 kg)   BMI 28.05 kg/m  .  BMI Body mass index is 28.05 kg/m.  Wt Readings from Last 3 Encounters:   01/06/17 206 lb 12.8 oz (93.8 kg)  12/26/16 204 lb 9.6 oz (92.8 kg)  06/25/16 204 lb 6.4 oz (92.7 kg)    General: Pleasant. Elderly male who is alert and in no acute distress.   HEENT: Normal.  Neck: Supple, no JVD, carotid bruits, or masses noted.  Cardiac: Regular rate and rhythm. Heart tones are distant. No edema.  Respiratory:  Lungs are clear to auscultation bilaterally with  normal work of breathing.  GI: Soft and nontender.  MS: No deformity or atrophy. Gait and ROM intact.  Skin: Warm and dry. Color is normal.  Neuro:  Strength and sensation are intact and no gross focal deficits noted.  Psych: Alert, appropriate and with normal affect.   LABORATORY DATA:  EKG:  EKG is ordered today. This demonstrates NSR, prior inferior infarct.  Lab Results  Component Value Date   WBC 4.6 12/25/2016   HGB 13.1 12/25/2016   HCT 38.2 (L) 12/25/2016   PLT 110 (L) 12/25/2016   GLUCOSE 185 (H) 12/25/2016   CHOL  11/26/2010    147        ATP III CLASSIFICATION:  <200     mg/dL   Desirable  200-239  mg/dL   Borderline High  >=240    mg/dL   High          TRIG 132 11/26/2010   HDL 29 (L) 11/26/2010   LDLCALC  11/26/2010    92        Total Cholesterol/HDL:CHD Risk Coronary Heart Disease Risk Table                     Men   Women  1/2 Average Risk   3.4   3.3  Average Risk       5.0   4.4  2 X Average Risk   9.6   7.1  3 X Average Risk  23.4   11.0        Use the calculated Patient Ratio above and the CHD Risk Table to determine the patient's CHD Risk.        ATP III CLASSIFICATION (LDL):  <100     mg/dL   Optimal  100-129  mg/dL   Near or Above                    Optimal  130-159  mg/dL   Borderline  160-189  mg/dL   High  >190     mg/dL   Very High   ALT 22 12/21/2016   AST 26 12/21/2016   NA 139 12/25/2016   K 4.5 12/25/2016   CL 106 12/25/2016   CREATININE 0.89 12/25/2016   BUN 9 12/25/2016   CO2 29 12/25/2016   TSH 0.746 12/24/2016   INR 1.11 01/12/2016    HGBA1C 7.3 (H) 12/25/2016    BNP (last 3 results)  Recent Labs  12/22/16 0259  BNP 272.9*    ProBNP (last 3 results) No results for input(s): PROBNP in the last 8760 hours.   Other Studies Reviewed Today:  Echo Study Conclusions from 01/2015  - Left ventricle: The cavity size was normal. Systolic function was mildly to moderately reduced. The estimated ejection fraction was in the range of 40% to 45%. Diffuse hypokinesis. There is hypokinesis of the inferior myocardium. Doppler parameters are consistent with abnormal left ventricular relaxation (grade 1 diastolic dysfunction). - Aorta: Aortic root dimension: 40 mm (ED). - Ascending aorta: The ascending aorta was mildly dilated. - Left atrium: The atrium was moderately dilated.  Impressions:  - EF is mildly improved when compared to prior echocardiogram.  Notes Recorded by Belva Crome, MD on 02/01/2015 at 6:28 PM Function has improved back to near normal. No need for discussion of defibrillator, just need to maintain current therapy.    Assessment/Plan:  1. Atrial fibrillation with RVR - recent exacerbation - most likely triggered by pneumonia/GI illness/dehydration - now  s/p cardioversion.  Remains on amiodarone, beta blocker and Xarelto. He remains in NSR by EKG and exam today. Clinically improved. No change with his current regimen.   2. Pneumonia - treated with antibiotics.   3. DM  4. CAD - no active symptoms.   5. Acute gastroenteritissecondary to norovirus - resolved.   6. History of chronic systolic CHF - Ejection fraction was 40% based on echocardiogram from 2016.   7. History of abdominal aortic aneurysm - prior aortoiliac surgical repair   Current medicines are reviewed with the patient today.  The patient does not have concerns regarding medicines other than what has been noted above.  The following changes have been made:  See above.  Labs/ tests ordered today include:    Orders  Placed This Encounter  Procedures  . EKG 12-Lead     Disposition:   FU with Dr. Tamala Julian in May - I will be happy to see him back also.   Patient is agreeable to this plan and will call if any problems develop in the interim.   SignedTruitt Merle, NP  01/06/2017 12:31 PM  [facility] [address] [place], [place]  [address] Phone: [phone] Fax: [phone]

## 2017-01-06 NOTE — Patient Instructions (Addendum)
We will be checking the following labs today - NONE   Medication Instructions:    Continue with your current medicines.     Testing/Procedures To Be Arranged:  N/A  Follow-Up:   See Dr. Tamala Julian in April - keep recall for August.     Other Special Instructions:   N/A    If you need a refill on your cardiac medications before your next appointment, please call your pharmacy.   Call the [place] office at [phone] if you have any questions, problems or concerns.

## 2017-02-02 IMAGING — DX DG CHEST 2V
2 series · 2 of 2 positions shown · non-contrast
Comparison: Chest radiograph performed 05/12/2015

CLINICAL DATA: Acute onset of generalized chest tightness and
tachycardia. Initial encounter.

EXAM:
CHEST  2 VIEW

[chest lat (1 of 2)]
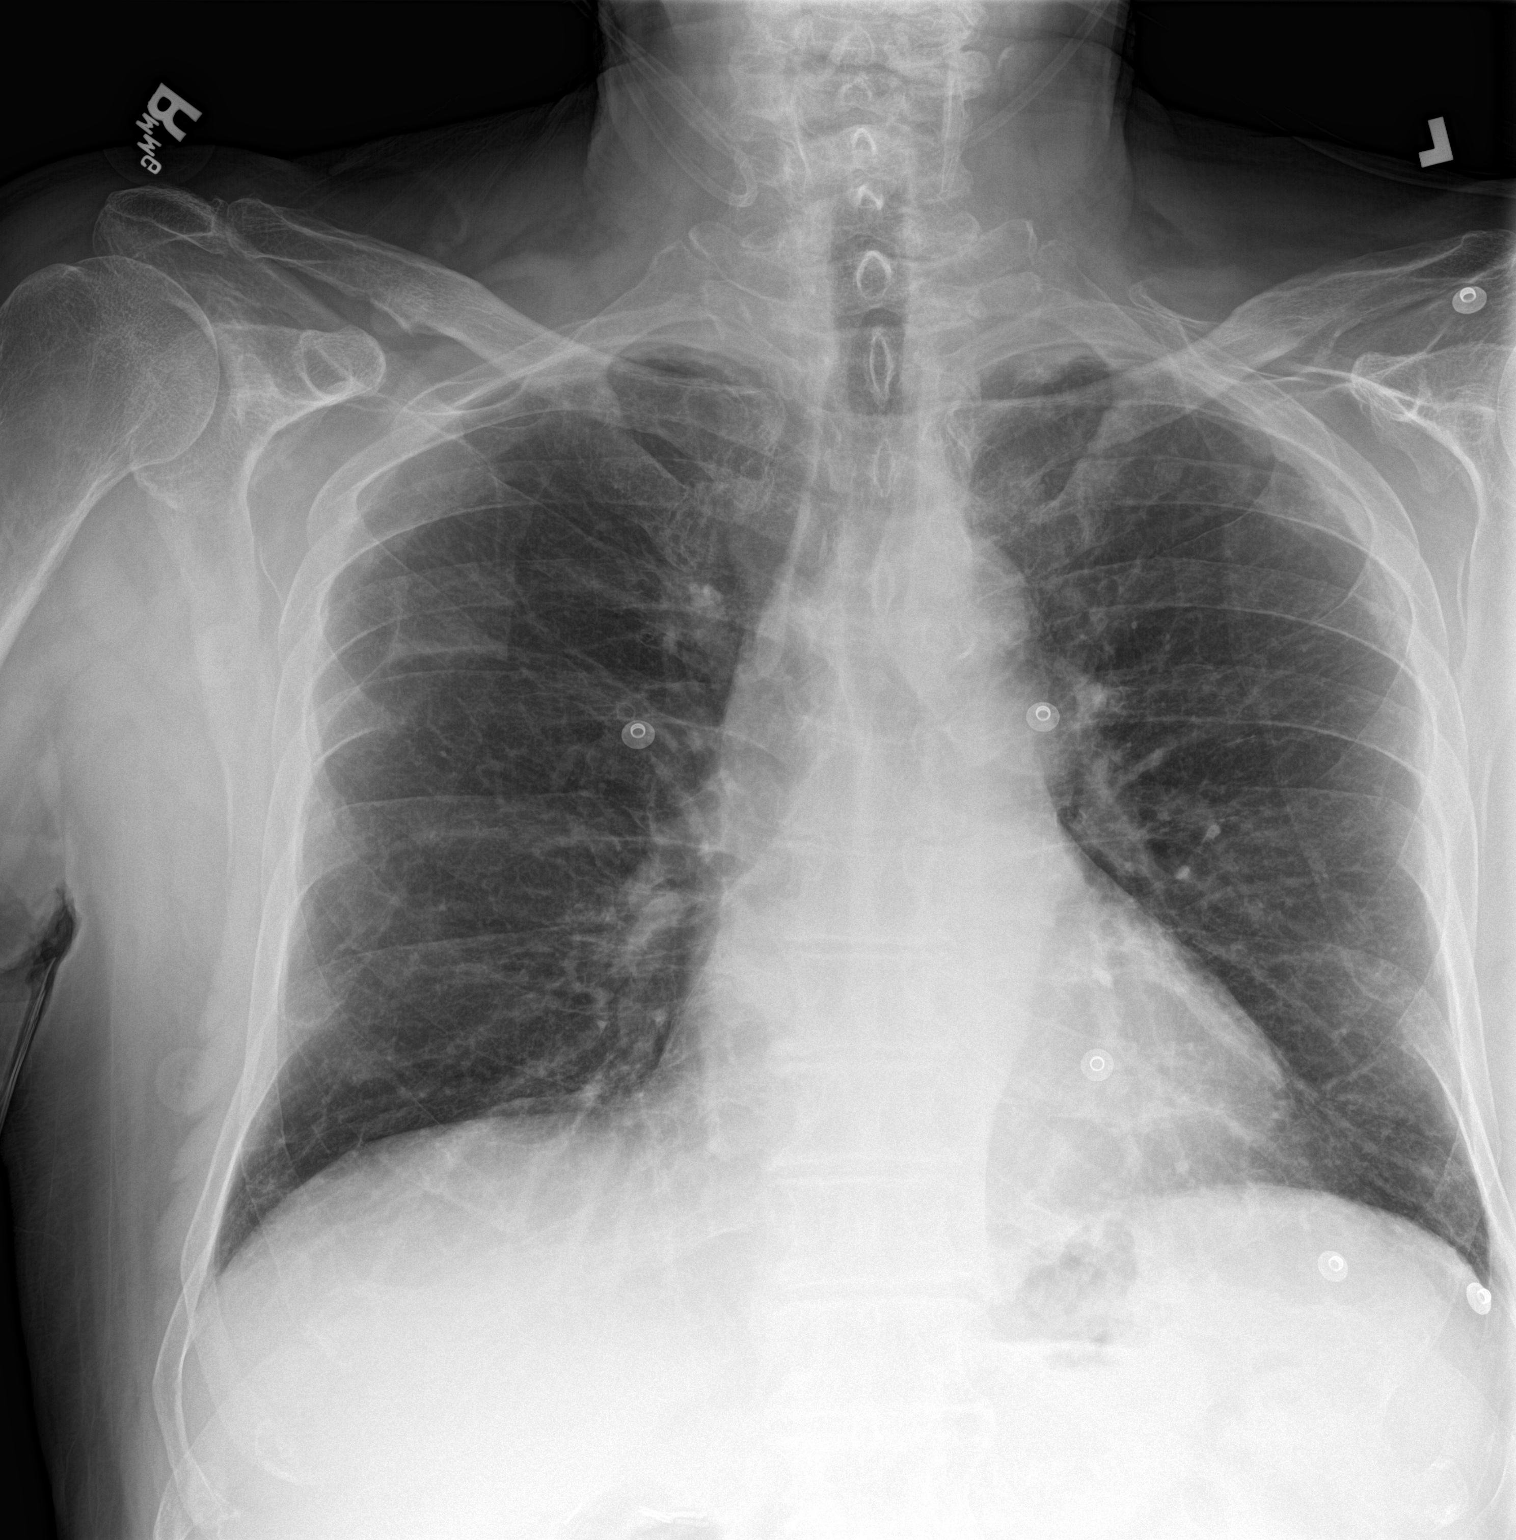

[chest lat (2 of 2)]
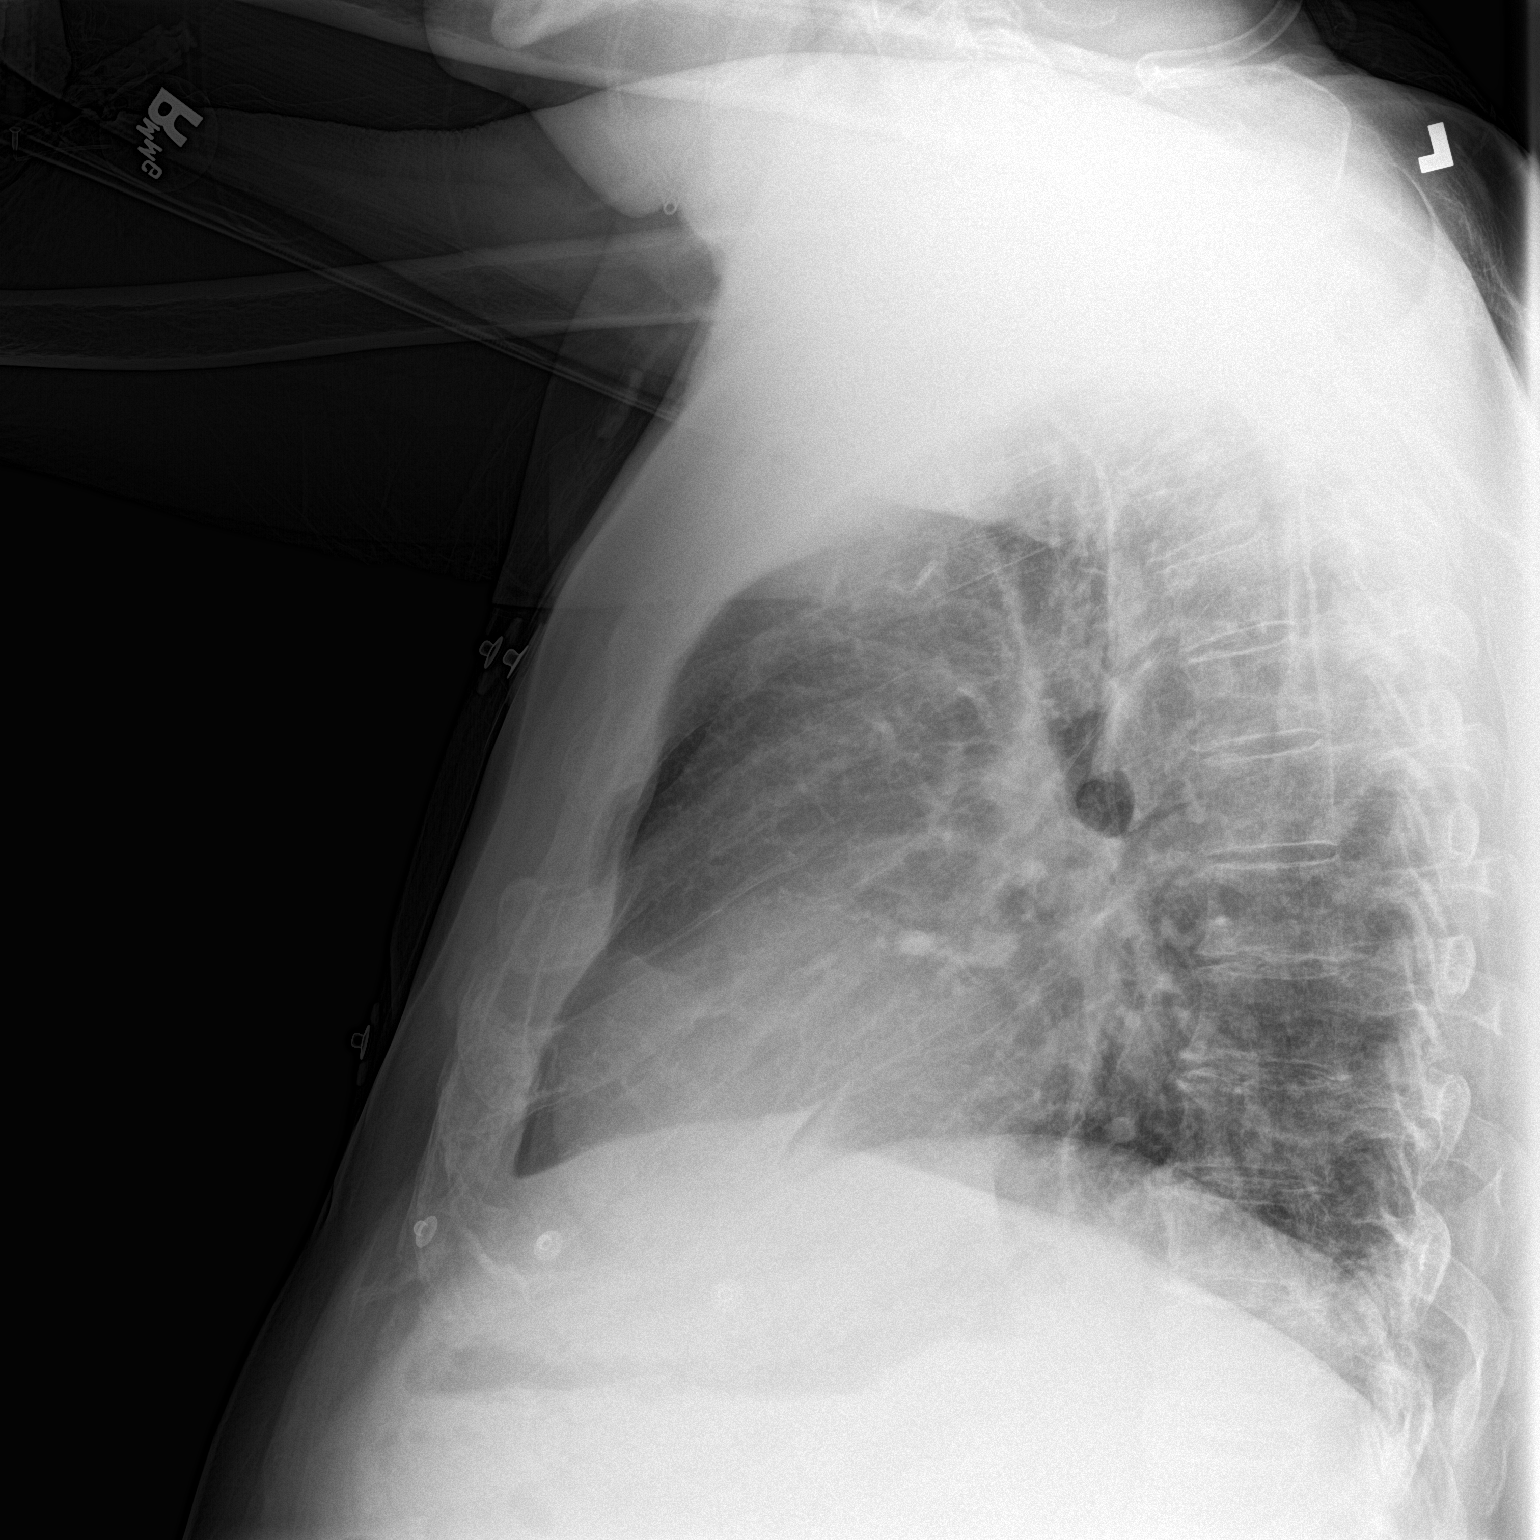

[2 of 2 positions shown; findings below may reference images not displayed]

FINDINGS: The lungs are well-aerated and clear. There is no evidence of focal
opacification, pleural effusion or pneumothorax.

The heart is normal in size; the mediastinal contour is within
normal limits. No acute osseous abnormalities are seen.
IMPRESSION: No acute cardiopulmonary process seen.

## 2017-02-10 ENCOUNTER — Telehealth: Payer: Self-pay

## 2017-02-10 NOTE — Telephone Encounter (Signed)
ERROR

## 2017-02-26 NOTE — Progress Notes (Signed)
Cardiology Office Note    Date:  02/27/2017   ID:  [name], DOB 1941-08-11, MRN [id number]  PCP:  Irven Shelling, MD  Cardiologist: Sinclair Grooms, MD   Chief Complaint  Patient presents with  . Atrial Fibrillation    History of Present Illness:  [name] is a 76 y.o. male has a past medical history of paroxysmal atrial fibrillation on amiodarone and is on chronic anticoagulation with Xarelto, CAD with prior PCI, & hypertension.   No recurrence of atrial fibrillation. Notice that amiodarone is disappeared from his med list. This happened in March when he was seen after discharge from the hospital. He doesn't recall ever being on the medication. He has been on this medication now for greater than a year.  He denies orthopnea and PND. He does have some cough. Thyroid and liver function was normal when hospitalized upward.  Past Medical History:  Diagnosis Date  . AAA (abdominal aortic aneurysm) ([place])    a. s/p repair 2009.  [name] Acid reflux    takes Prilosec and Protonix daily  . Arthritis    BacK  . CAD in native artery    a. a. prior inf MI 1993. b. PCI to LAD 05/1997. c. recurrent inferolateral MI complicated by V. fib arrest in 04/1998, prior PCI to OM1. d. 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  [name] Chronic combined systolic and diastolic CHF (congestive heart failure) ([place])   . Complication of anesthesia    -years ago hair fell out.;ileus after 2 of his surgeries  . Dry skin   . Foot drop, bilateral   . Hypercholesteremia   . Hypertension   . Ileus ([place])    After AAA  . Incisional hernia    "small, from AAA"  . Ischemic cardiomyopathy   . MI (myocardial infarction) ([place])   . 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 OSA on CPAP    uses CPAP--sleep study done at least 41yr ago  . PAF (paroxysmal atrial fibrillation) ([place]   . Pain    back pain chronic- seen at pain clinic  . Plantar fasciitis    bilatetral  . Thrombocytopenia ([place]   . Type II diabetes mellitus  ([place]     Past Surgical History:  Procedure Laterality Date  . ABDOMINAL AORTIC ANEURYSM REPAIR    . BACK SURGERY  2012-2013 X 3   Miminal Invasive x 3in [place].  .Marland KitchenCARDIAC CATHETERIZATION  2009/2012  . CARDIOVERSION N/A 04/12/2015   Procedure: CARDIOVERSION;  Surgeon: TSueanne Margarita MD;  [facility]  Service: Cardiovascular;  Laterality: N/A;  . CARDIOVERSION N/A 12/26/2016   Procedure: CARDIOVERSION;  Surgeon: TSueanne Margarita MD;  [facility];  Service: Cardiovascular;  Laterality: N/A;  . COLONOSCOPY    . CORONARY ANGIOPLASTY WITH STENT PLACEMENT  1992; 1997  . LAPAROSCOPIC CHOLECYSTECTOMY    . LUMBAR LAMINECTOMY/DECOMPRESSION MICRODISCECTOMY Right 01/16/2016   Procedure: Right Lumbar five-Sacral one Laminectomy;  Surgeon: HKristeen Miss MD;  [facility];  Service: Neurosurgery;  Laterality: Right;  Right L5-S1 Laminectomy    Current Medications: Outpatient Medications Prior to Visit  Medication Sig Dispense Refill  . atorvastatin (LIPITOR) 10 MG tablet Take 10 mg by mouth daily.    . Coenzyme Q10 (CO Q 10) 100 MG CAPS Take 100 mg by mouth daily.    . diazepam (VALIUM) 5 MG tablet Take 5 mg by mouth 2 (two) times daily as needed (leg  cramps/ sleep).     . empagliflozin (JARDIANCE) 10 MG TABS tablet Take 10 mg by mouth daily with breakfast.     . glimepiride (AMARYL) 2 MG tablet Take 2 mg by mouth daily with breakfast.     . metFORMIN (GLUCOPHAGE-XR) 500 MG 24 hr tablet Take 1,000 mg by mouth 2 (two) times daily.     . metoprolol (LOPRESSOR) 50 MG tablet Take 50 mg by mouth 2 (two) times daily.    . Multiple Vitamin (MULTIVITAMIN WITH MINERALS) TABS Take 1 tablet by mouth daily.    [name] omeprazole (PRILOSEC) 20 MG capsule Take 20 mg by mouth daily.    [name] oxyCODONE-acetaminophen (PERCOCET/ROXICET) 5-325 MG tablet Take 1 tablet by mouth every 6 (six) hours as needed (pain).     . ramipril (ALTACE) 10 MG capsule Take 10 mg by mouth as needed (for bp above  130).    . vitamin B-12 (CYANOCOBALAMIN) 1000 MCG tablet Take 1,000 mcg by mouth daily.     Alveda Reasons 20 MG TABS tablet TAKE ONE TABLET BY MOUTH ONCE DAILY WITH SUPPER 90 tablet 2   No facility-administered medications prior to visit.      Allergies:   Cymbalta [duloxetine hcl]; Neurontin [gabapentin]; Lyrica [pregabalin]; and Keppra [levetiracetam]   Social History   Social History  . Marital status: Married    Spouse name: N/A  . Number of children: N/A  . Years of education: N/A   Social History Main Topics  . Smoking status: Former Smoker    Packs/day: 1.00    Years: 33.00    Types: Cigarettes    Quit date: 01/01/1992  . Smokeless tobacco: Never Used  . Alcohol use Yes     Comment: 6/30;2017 "GLASS OF WINE Q COUPLE MONTHS, IF THAT"  . Drug use: No  . Sexual activity: Yes   Other Topics Concern  . None   Social History Narrative  . None     Family History:  The patient's family history includes Arthritis in his father; Coronary artery disease in his father and mother; Diabetes in his father and mother; Rheum arthritis in his father.   ROS:   Please see the history of present illness.    Bilateral foot discomfort and insomnia related to lumbar disc disease.  All other systems reviewed and are negative.   PHYSICAL EXAM:   VS:  BP 112/62 (BP Location: Right Arm)   Pulse 79   Ht '6\' 1"'$  (1.854 m)   Wt 210 lb 12.8 oz (95.6 kg)   BMI 27.81 kg/m    GEN: Well nourished, well developed, in no acute distress  HEENT: normal  Neck: no JVD, carotid bruits, or masses Cardiac: RRR; no murmurs, rubs, or gallops,no edema  Respiratory:  clear to auscultation bilaterally, normal work of breathing GI: soft, nontender, nondistended, + BS MS: no deformity or atrophy  Skin: warm and dry, no rash Neuro:  Alert and Oriented x 3, Strength and sensation are intact Psych: euthymic mood, full affect  Wt Readings from Last 3 Encounters:  02/27/17 210 lb 12.8 oz (95.6 kg)  01/06/17  206 lb 12.8 oz (93.8 kg)  12/26/16 204 lb 9.6 oz (92.8 kg)      Studies/Labs Reviewed:   EKG:  EKG  not done  Recent Labs: 12/21/2016: ALT 22 12/22/2016: B Natriuretic Peptide 272.9 12/23/2016: Magnesium 2.0 12/24/2016: TSH 0.746 12/25/2016: BUN 9; Creatinine, Ser 0.89; Hemoglobin 13.1; Platelets 110; Potassium 4.5; Sodium 139   Lipid Panel  Component Value Date/Time   CHOL  11/26/2010 0750    147        ATP III CLASSIFICATION:  <200     mg/dL   Desirable  200-239  mg/dL   Borderline High  >=240    mg/dL   High          TRIG 132 11/26/2010 0750   HDL 29 (L) 11/26/2010 0750   CHOLHDL 5.1 11/26/2010 0750   VLDL 26 11/26/2010 0750   LDLCALC  11/26/2010 0750    92        Total Cholesterol/HDL:CHD Risk Coronary Heart Disease Risk Table                     Men   Women  1/2 Average Risk   3.4   3.3  Average Risk       5.0   4.4  2 X Average Risk   9.6   7.1  3 X Average Risk  23.4   11.0        Use the calculated Patient Ratio above and the CHD Risk Table to determine the patient's CHD Risk.        ATP III CLASSIFICATION (LDL):  <100     mg/dL   Optimal  100-129  mg/dL   Near or Above                    Optimal  130-159  mg/dL   Borderline  160-189  mg/dL   High  >190     mg/dL   Very High    Additional studies/ records that were reviewed today include:  No new data    ASSESSMENT:    1. Atrial fibrillation with rapid ventricular response ([place])   2. Chronic combined systolic and diastolic heart failure (HCC)   3. Atherosclerosis of native coronary artery of native heart without angina pectoris   4. Essential hypertension   5. Obstructive sleep apnea syndrome   6. Uncontrolled type 2 diabetes mellitus with diabetic neuropathy, without long-term current use of insulin ([place])   7. Long term current use of anticoagulant therapy      PLAN:  In order of problems listed above:  1. Maintaining sinus rhythm after cardioversion from atrial fibrillation and normal  sinus rhythm. A. fib has been treated with amiodarone for quite some time. He is now off the medication by mistake. Resume amiodarone 200 mg per day. 2. No evidence of volume overload 3. No chest pain or symptoms to suggest angina. 4. Excellent blood pressure control 5. Continue CPAP 6. Not address 7. Continue Xarelto.  Clinical follow-up in 6 months. TSH and hepatic panel at bedtime. Resume amiodarone 200 mg daily.    Medication Adjustments/Labs and Tests Ordered: Current medicines are reviewed at length with the patient today.  Concerns regarding medicines are outlined above.  Medication changes, Labs and Tests ordered today are listed in the Patient Instructions below. Patient Instructions  Medication Instructions:  1) Amiodarone '200mg'$  once daily  Labwork: Plan to have labs drawn at your next appointment.   Testing/Procedures: None  Follow-Up: Your physician wants you to follow-up in: 6 months with Dr. Tamala Julian.  You will receive a reminder letter in the mail two months in advance. If you don't receive a letter, please call our office to schedule the follow-up appointment.   Any Other Special Instructions Will Be Listed Below (If Applicable).     If you need a refill on your  cardiac medications before your next appointment, please call your pharmacy.      Signed, Sinclair Grooms, MD  02/27/2017 12:41 PM    [facility] [address], [place], [place]  [address] Phone: [phone]; Fax: [phone]

## 2017-02-27 ENCOUNTER — Ambulatory Visit (INDEPENDENT_AMBULATORY_CARE_PROVIDER_SITE_OTHER): Payer: Medicare Other | Admitting: Interventional Cardiology

## 2017-02-27 ENCOUNTER — Encounter: Payer: Self-pay | Admitting: Interventional Cardiology

## 2017-02-27 VITALS — BP 112/62 | HR 79 | Ht 73.0 in | Wt 210.8 lb

## 2017-02-27 DIAGNOSIS — I2510 Atherosclerotic heart disease of native coronary artery without angina pectoris: Secondary | ICD-10-CM | POA: Diagnosis not present

## 2017-02-27 DIAGNOSIS — I4891 Unspecified atrial fibrillation: Secondary | ICD-10-CM

## 2017-02-27 DIAGNOSIS — E1165 Type 2 diabetes mellitus with hyperglycemia: Secondary | ICD-10-CM

## 2017-02-27 DIAGNOSIS — I10 Essential (primary) hypertension: Secondary | ICD-10-CM

## 2017-02-27 DIAGNOSIS — Z7901 Long term (current) use of anticoagulants: Secondary | ICD-10-CM | POA: Diagnosis not present

## 2017-02-27 DIAGNOSIS — I5042 Chronic combined systolic (congestive) and diastolic (congestive) heart failure: Secondary | ICD-10-CM | POA: Diagnosis not present

## 2017-02-27 DIAGNOSIS — G4733 Obstructive sleep apnea (adult) (pediatric): Secondary | ICD-10-CM | POA: Diagnosis not present

## 2017-02-27 DIAGNOSIS — IMO0002 Reserved for concepts with insufficient information to code with codable children: Secondary | ICD-10-CM

## 2017-02-27 DIAGNOSIS — E1140 Type 2 diabetes mellitus with diabetic neuropathy, unspecified: Secondary | ICD-10-CM

## 2017-02-27 MED ORDER — AMIODARONE HCL 200 MG PO TABS
200.0000 mg | ORAL_TABLET | Freq: Every day | ORAL | 3 refills | Status: DC
Start: 2017-02-27 — End: 2018-03-04

## 2017-02-27 NOTE — Patient Instructions (Signed)
Medication Instructions:  1) Amiodarone '200mg'$  once daily  Labwork: Plan to have labs drawn at your next appointment.   Testing/Procedures: None  Follow-Up: Your physician wants you to follow-up in: 6 months with Dr. Tamala Julian.  You will receive a reminder letter in the mail two months in advance. If you don't receive a letter, please call our office to schedule the follow-up appointment.   Any Other Special Instructions Will Be Listed Below (If Applicable).     If you need a refill on your cardiac medications before your next appointment, please call your pharmacy.

## 2017-05-14 ENCOUNTER — Other Ambulatory Visit: Payer: Self-pay | Admitting: Interventional Cardiology

## 2017-06-11 ENCOUNTER — Observation Stay (HOSPITAL_COMMUNITY): Payer: Medicare Other

## 2017-06-11 ENCOUNTER — Encounter (HOSPITAL_COMMUNITY): Payer: Self-pay | Admitting: Emergency Medicine

## 2017-06-11 ENCOUNTER — Observation Stay (HOSPITAL_COMMUNITY)
Admission: EM | Admit: 2017-06-11 | Discharge: 2017-06-13 | Disposition: A | Discharge status: Home / Self Care | Payer: Medicare Other | Attending: Internal Medicine | Admitting: Internal Medicine

## 2017-06-11 DIAGNOSIS — IMO0002 Reserved for concepts with insufficient information to code with codable children: Secondary | ICD-10-CM

## 2017-06-11 DIAGNOSIS — I10 Essential (primary) hypertension: Secondary | ICD-10-CM | POA: Diagnosis present

## 2017-06-11 DIAGNOSIS — Z7984 Long term (current) use of oral hypoglycemic drugs: Secondary | ICD-10-CM | POA: Insufficient documentation

## 2017-06-11 DIAGNOSIS — Z7901 Long term (current) use of anticoagulants: Secondary | ICD-10-CM | POA: Diagnosis not present

## 2017-06-11 DIAGNOSIS — R229 Localized swelling, mass and lump, unspecified: Secondary | ICD-10-CM

## 2017-06-11 DIAGNOSIS — E1165 Type 2 diabetes mellitus with hyperglycemia: Secondary | ICD-10-CM | POA: Diagnosis present

## 2017-06-11 DIAGNOSIS — E11649 Type 2 diabetes mellitus with hypoglycemia without coma: Secondary | ICD-10-CM | POA: Insufficient documentation

## 2017-06-11 DIAGNOSIS — E7800 Pure hypercholesterolemia, unspecified: Secondary | ICD-10-CM | POA: Insufficient documentation

## 2017-06-11 DIAGNOSIS — Z79899 Other long term (current) drug therapy: Secondary | ICD-10-CM | POA: Diagnosis not present

## 2017-06-11 DIAGNOSIS — K219 Gastro-esophageal reflux disease without esophagitis: Secondary | ICD-10-CM | POA: Insufficient documentation

## 2017-06-11 DIAGNOSIS — I252 Old myocardial infarction: Secondary | ICD-10-CM | POA: Diagnosis not present

## 2017-06-11 DIAGNOSIS — J984 Other disorders of lung: Secondary | ICD-10-CM | POA: Diagnosis not present

## 2017-06-11 DIAGNOSIS — E1149 Type 2 diabetes mellitus with other diabetic neurological complication: Secondary | ICD-10-CM | POA: Insufficient documentation

## 2017-06-11 DIAGNOSIS — R55 Syncope and collapse: Secondary | ICD-10-CM | POA: Diagnosis not present

## 2017-06-11 DIAGNOSIS — I5042 Chronic combined systolic (congestive) and diastolic (congestive) heart failure: Secondary | ICD-10-CM | POA: Diagnosis present

## 2017-06-11 DIAGNOSIS — I480 Paroxysmal atrial fibrillation: Secondary | ICD-10-CM | POA: Diagnosis not present

## 2017-06-11 DIAGNOSIS — I2510 Atherosclerotic heart disease of native coronary artery without angina pectoris: Secondary | ICD-10-CM | POA: Diagnosis not present

## 2017-06-11 DIAGNOSIS — I110 Hypertensive heart disease with heart failure: Secondary | ICD-10-CM | POA: Insufficient documentation

## 2017-06-11 DIAGNOSIS — G4733 Obstructive sleep apnea (adult) (pediatric): Secondary | ICD-10-CM | POA: Insufficient documentation

## 2017-06-11 DIAGNOSIS — Z87891 Personal history of nicotine dependence: Secondary | ICD-10-CM | POA: Diagnosis not present

## 2017-06-11 DIAGNOSIS — E1140 Type 2 diabetes mellitus with diabetic neuropathy, unspecified: Secondary | ICD-10-CM | POA: Diagnosis not present

## 2017-06-11 DIAGNOSIS — I951 Orthostatic hypotension: Secondary | ICD-10-CM | POA: Insufficient documentation

## 2017-06-11 LAB — BASIC METABOLIC PANEL
Anion gap: 9 (ref 5–15)
BUN: 25 mg/dL — ABNORMAL HIGH (ref 6–20)
CO2: 24 mmol/L (ref 22–32)
Calcium: 9.6 mg/dL (ref 8.9–10.3)
Chloride: 104 mmol/L (ref 101–111)
Creatinine, Ser: 1.2 mg/dL (ref 0.61–1.24)
GFR calc Af Amer: >60 mL/min (ref >60)
GFR calc non Af Amer: 56 mL/min — ABNORMAL LOW (ref >60)
Glucose, Bld: 184 mg/dL — ABNORMAL HIGH (ref 65–99)
Potassium: 4.3 mmol/L (ref 3.5–5.1)
Sodium: 137 mmol/L (ref 135–145)

## 2017-06-11 LAB — GLUCOSE, CAPILLARY: Glucose-Capillary: 158 mg/dL — ABNORMAL HIGH (ref 65–99)

## 2017-06-11 LAB — CBC
HCT: 41.4 % (ref 39.0–52.0)
Hemoglobin: 14.1 g/dL (ref 13.0–17.0)
MCH: 30.8 pg (ref 26.0–34.0)
MCHC: 34.1 g/dL (ref 30.0–36.0)
MCV: 90.4 fL (ref 78.0–100.0)
Platelets: 134 10*3/uL — ABNORMAL LOW (ref 150–400)
RBC: 4.58 MIL/uL (ref 4.22–5.81)
RDW: 13.6 % (ref 11.5–15.5)
WBC: 9.2 10*3/uL (ref 4.0–10.5)

## 2017-06-11 LAB — I-STAT TROPONIN, ED: Troponin i, poc: 0 ng/mL (ref 0.00–0.08)

## 2017-06-11 MED ORDER — ADULT MULTIVITAMIN W/MINERALS CH
1.0000 | ORAL_TABLET | Freq: Every day | ORAL | Status: DC
  Administered 2017-06-12 – 2017-06-13 (×2): 1 via ORAL
  Filled 2017-06-11 (×2): qty 1

## 2017-06-11 MED ORDER — AMITRIPTYLINE HCL 25 MG PO TABS
75.0000 mg | ORAL_TABLET | Freq: Every day | ORAL | Status: DC
  Administered 2017-06-11 – 2017-06-12 (×2): 75 mg via ORAL
  Filled 2017-06-11 (×3): qty 3

## 2017-06-11 MED ORDER — INSULIN ASPART 100 UNIT/ML ~~LOC~~ SOLN
0.0000 [IU] | Freq: Every day | SUBCUTANEOUS | Status: DC
  Administered 2017-06-12: 2 [IU] via SUBCUTANEOUS

## 2017-06-11 MED ORDER — ONDANSETRON HCL 4 MG PO TABS: 4.0000 mg | ORAL_TABLET | Freq: Four times a day (QID) | ORAL | Status: DC | PRN

## 2017-06-11 MED ORDER — OXYCODONE-ACETAMINOPHEN 5-325 MG PO TABS
1.0000 | ORAL_TABLET | Freq: Four times a day (QID) | ORAL | Status: DC | PRN
  Administered 2017-06-13 (×2): 1 via ORAL
  Filled 2017-06-11 (×2): qty 1

## 2017-06-11 MED ORDER — ONE-A-DAY MENS 50+ ADVANTAGE PO TABS: 1.0000 | ORAL_TABLET | Freq: Every day | ORAL | Status: DC

## 2017-06-11 MED ORDER — SODIUM CHLORIDE 0.9% FLUSH
3.0000 mL | Freq: Two times a day (BID) | INTRAVENOUS | Status: DC
  Administered 2017-06-11 – 2017-06-12 (×2): 3 mL via INTRAVENOUS

## 2017-06-11 MED ORDER — BISACODYL 5 MG PO TBEC: 5.0000 mg | DELAYED_RELEASE_TABLET | Freq: Every day | ORAL | Status: DC | PRN

## 2017-06-11 MED ORDER — ACETAMINOPHEN 650 MG RE SUPP: 650.0000 mg | Freq: Four times a day (QID) | RECTAL | Status: DC | PRN

## 2017-06-11 MED ORDER — INSULIN ASPART 100 UNIT/ML ~~LOC~~ SOLN
0.0000 [IU] | Freq: Three times a day (TID) | SUBCUTANEOUS | Status: DC
  Administered 2017-06-13: 8 [IU] via SUBCUTANEOUS

## 2017-06-11 MED ORDER — RIVAROXABAN 20 MG PO TABS
20.0000 mg | ORAL_TABLET | Freq: Every day | ORAL | Status: DC
  Administered 2017-06-11 – 2017-06-13 (×3): 20 mg via ORAL
  Filled 2017-06-11 (×3): qty 1

## 2017-06-11 MED ORDER — ONDANSETRON HCL 4 MG/2ML IJ SOLN: 4.0000 mg | Freq: Four times a day (QID) | INTRAMUSCULAR | Status: DC | PRN

## 2017-06-11 MED ORDER — ATORVASTATIN CALCIUM 10 MG PO TABS
10.0000 mg | ORAL_TABLET | Freq: Every day | ORAL | Status: DC
  Administered 2017-06-12 – 2017-06-13 (×2): 10 mg via ORAL
  Filled 2017-06-11 (×2): qty 1

## 2017-06-11 MED ORDER — ACETAMINOPHEN 325 MG PO TABS: 650.0000 mg | ORAL_TABLET | Freq: Four times a day (QID) | ORAL | Status: DC | PRN

## 2017-06-11 MED ORDER — INSULIN ASPART 100 UNIT/ML ~~LOC~~ SOLN: 3.0000 [IU] | Freq: Three times a day (TID) | SUBCUTANEOUS | Status: DC

## 2017-06-11 MED ORDER — SENNOSIDES-DOCUSATE SODIUM 8.6-50 MG PO TABS: 1.0000 | ORAL_TABLET | Freq: Every evening | ORAL | Status: DC | PRN

## 2017-06-11 MED ORDER — RAMIPRIL 10 MG PO CAPS
10.0000 mg | ORAL_CAPSULE | Freq: Every day | ORAL | Status: DC
  Administered 2017-06-12 – 2017-06-13 (×2): 10 mg via ORAL
  Filled 2017-06-11: qty 4
  Filled 2017-06-11: qty 1
  Filled 2017-06-11: qty 4
  Filled 2017-06-11: qty 1

## 2017-06-11 MED ORDER — OXYCODONE-ACETAMINOPHEN 5-325 MG PO TABS
1.0000 | ORAL_TABLET | Freq: Once | ORAL | Status: AC
  Administered 2017-06-11: 1 via ORAL
  Filled 2017-06-11: qty 1

## 2017-06-11 MED ORDER — PANTOPRAZOLE SODIUM 40 MG PO TBEC
40.0000 mg | DELAYED_RELEASE_TABLET | Freq: Every day | ORAL | Status: DC
  Administered 2017-06-12 – 2017-06-13 (×2): 40 mg via ORAL
  Filled 2017-06-11 (×2): qty 1

## 2017-06-11 MED ORDER — ENOXAPARIN SODIUM 40 MG/0.4ML ~~LOC~~ SOLN: 40.0000 mg | SUBCUTANEOUS | Status: DC

## 2017-06-11 MED ORDER — METOPROLOL TARTRATE 25 MG PO TABS
50.0000 mg | ORAL_TABLET | Freq: Two times a day (BID) | ORAL | Status: DC
Start: 2017-06-12 — End: 2017-06-13
  Administered 2017-06-12 – 2017-06-13 (×2): 50 mg via ORAL
  Filled 2017-06-11 (×3): qty 2

## 2017-06-11 MED ORDER — AMIODARONE HCL 200 MG PO TABS
200.0000 mg | ORAL_TABLET | Freq: Every day | ORAL | Status: DC
  Administered 2017-06-12 – 2017-06-13 (×2): 200 mg via ORAL
  Filled 2017-06-11 (×2): qty 1

## 2017-06-11 MED ORDER — VITAMIN B-12 1000 MCG PO TABS
1000.0000 ug | ORAL_TABLET | Freq: Every day | ORAL | Status: DC
  Administered 2017-06-12 – 2017-06-13 (×2): 1000 ug via ORAL
  Filled 2017-06-11 (×2): qty 1

## 2017-06-11 MED ORDER — DIAZEPAM 5 MG PO TABS
5.0000 mg | ORAL_TABLET | Freq: Every evening | ORAL | Status: DC | PRN
  Administered 2017-06-11 – 2017-06-12 (×2): 5 mg via ORAL
  Filled 2017-06-11 (×2): qty 1

## 2017-06-11 NOTE — ED Provider Notes (Signed)
[facility] DEPT Provider Note   CSN: [id number] Arrival date & time: 06/11/17  1626     History   Chief Complaint Chief Complaint  Patient presents with  . Near Syncope    HPI [name] is a 76 y.o. male.HPI Patient reports he felt generally weak and sweaty approximately one hour prior to calling 911, while sitting at his computer. He went to the commode and while sitting on commode suffered near syncopal event. He presently is asymptomatic without treatment. He reports that he had his blood pressure taken while sitting on the commode which was 90/40 and his pulse was "low" He denies having had any chest pain any nausea no abdominal pain no headache. No other associated symptoms Past Medical History:  Diagnosis Date  . AAA (abdominal aortic aneurysm) ([place])    a. s/p repair 2009.  [name] Acid reflux    takes Prilosec and Protonix daily  . Arthritis    BacK  . CAD in native artery    a. a. prior inf MI 1993. b. PCI to LAD 05/1997. c. recurrent inferolateral MI complicated by V. fib arrest in 04/1998, prior PCI to OM1. d. 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  [name] Chronic combined systolic and diastolic CHF (congestive heart failure) ([place])   . Complication of anesthesia    -years ago hair fell out.;ileus after 2 of his surgeries  . Dry skin   . Foot drop, bilateral   . Hypercholesteremia   . Hypertension   . Ileus ([place])    After AAA  . Incisional hernia    "small, from AAA"  . Ischemic cardiomyopathy   . MI (myocardial infarction) ([place])   . 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 OSA on CPAP    uses CPAP--sleep study done at least 43yrs ago  . PAF (paroxysmal atrial fibrillation) ([place])   . Pain    back pain chronic- seen at pain clinic  . Plantar fasciitis    bilatetral  . Thrombocytopenia ([place])   . Type II diabetes mellitus [facility]     Patient Active Problem List   Diagnosis Date Noted  . Hypoxia   . Pneumonia of left lower lobe due to infectious organism ([place])   .  Gastroenteritis and colitis, viral 04/27/2016  . Orthostasis 04/26/2016  . Nausea vomiting and diarrhea 04/26/2016  . Lumbar radiculopathy, chronic 01/16/2016  . Hematoma of left kidney 11/01/2015  . Hyperlipidemia 12/12/2014  . SIRS (systemic inflammatory response syndrome) ([place]) 12/12/2014  . Influenza due to identified novel influenza A virus with other respiratory manifestations 11/07/2013  . Long term current use of anticoagulant therapy 09/08/2013    Class: Chronic  . Coronary atherosclerosis of native coronary artery 08/16/2013    Class: Chronic  . Chronic combined systolic and diastolic heart failure (HCC) 08/16/2013    Class: Chronic  . Paroxysmal atrial fibrillation with RVR ([place]) 08/13/2013  . Paralytic ileus ([place]) 05/07/2012  . Nausea and vomiting 01/01/2012  . DM (diabetes mellitus), type 2, uncontrolled w/neurologic complication ([place]) 47/42/5956  . Hypertension   . AAA (abdominal aortic aneurysm) ([place])   . Acid reflux   . Sleep apnea     Past Surgical History:  Procedure Laterality Date  . ABDOMINAL AORTIC ANEURYSM REPAIR    . BACK SURGERY  2012-2013 X 3   Miminal Invasive x 3in [place].  [name] CARDIAC CATHETERIZATION  2009/2012  . CARDIOVERSION N/A 04/12/2015   Procedure: CARDIOVERSION;  Surgeon: Sueanne Margarita, MD;  Location: [facility];  Service: Cardiovascular;  Laterality: N/A;  . CARDIOVERSION N/A 12/26/2016   Procedure: CARDIOVERSION;  Surgeon: Sueanne Margarita, MD;  [facility];  Service: Cardiovascular;  Laterality: N/A;  . COLONOSCOPY    . CORONARY ANGIOPLASTY WITH STENT PLACEMENT  1992; 1997  . LAPAROSCOPIC CHOLECYSTECTOMY    . LUMBAR LAMINECTOMY/DECOMPRESSION MICRODISCECTOMY Right 01/16/2016   Procedure: Right Lumbar five-Sacral one Laminectomy;  Surgeon: Kristeen Miss, MD;  Location: [facility];  Service: Neurosurgery;  Laterality: Right;  Right L5-S1 Laminectomy       Home Medications    Prior to Admission medications   Medication Sig  Start Date End Date Taking? Authorizing Provider  amiodarone (PACERONE) 200 MG tablet Take 1 tablet (200 mg total) by mouth daily. 02/27/17   Belva Crome, MD  amitriptyline (ELAVIL) 25 MG tablet Take 25 mg by mouth daily. 02/19/17   [provider]  atorvastatin (LIPITOR) 10 MG tablet Take 10 mg by mouth daily.    [provider]  Coenzyme Q10 (CO Q 10) 100 MG CAPS Take 100 mg by mouth daily.    [provider]  diazepam (VALIUM) 5 MG tablet Take 5 mg by mouth 2 (two) times daily as needed (leg cramps/ sleep).  10/31/15   [provider]  empagliflozin (JARDIANCE) 10 MG TABS tablet Take 10 mg by mouth daily with breakfast.     [provider]  glimepiride (AMARYL) 2 MG tablet Take 2 mg by mouth daily with breakfast.  01/10/15   [provider]  metFORMIN (GLUCOPHAGE-XR) 500 MG 24 hr tablet Take 1,000 mg by mouth 2 (two) times daily.  10/12/15   [provider]  metoprolol tartrate (LOPRESSOR) 50 MG tablet TAKE ONE TABLET BY MOUTH TWICE DAILY 05/14/17   Belva Crome, MD  Multiple Vitamin (MULTIVITAMIN WITH MINERALS) TABS Take 1 tablet by mouth daily.    [provider]  omeprazole (PRILOSEC) 20 MG capsule Take 20 mg by mouth daily. 09/14/12   [provider]  oxyCODONE-acetaminophen (PERCOCET/ROXICET) 5-325 MG tablet Take 1 tablet by mouth every 6 (six) hours as needed (pain).     [provider]  ramipril (ALTACE) 10 MG capsule Take 10 mg by mouth as needed (for bp above 130).    [provider]  vitamin B-12 (CYANOCOBALAMIN) 1000 MCG tablet Take 1,000 mcg by mouth daily.     [provider]  XARELTO 20 MG TABS tablet TAKE ONE TABLET BY MOUTH ONCE DAILY WITH SUPPER 09/27/16   Belva Crome, MD    Family History Family History  Problem Relation Age of Onset  . Diabetes Mother   . Coronary artery disease Mother   . Diabetes Father   . Arthritis Father   . Rheum arthritis Father   .  Coronary artery disease Father     Social History Social History  Substance Use Topics  . Smoking status: Former Smoker    Packs/day: 1.00    Years: 33.00    Types: Cigarettes    Quit date: 01/01/1992  . Smokeless tobacco: Never Used  . Alcohol use Yes     Comment: 6/30;2017 "GLASS OF WINE Q COUPLE MONTHS, IF THAT"     Allergies   Cymbalta [duloxetine hcl]; Neurontin [gabapentin]; Lyrica [pregabalin]; and Keppra [levetiracetam]   Review of Systems Review of Systems  Constitutional: Positive for diaphoresis.  HENT: Negative.   Respiratory: Negative.   Cardiovascular: Negative.   Gastrointestinal: Negative.   Musculoskeletal: Negative.  Skin: Negative.   Neurological: Positive for weakness.       Generalized weakness  Psychiatric/Behavioral: Negative.   All other systems reviewed and are negative.    Physical Exam Updated Vital Signs There were no vitals taken for this visit.  Physical Exam  Constitutional: He appears well-developed and well-nourished.  HENT:  Head: Normocephalic and atraumatic.  Eyes: Pupils are equal, round, and reactive to light. Conjunctivae are normal.  Neck: Neck supple. No tracheal deviation present. No thyromegaly present.  Cardiovascular: Normal rate and regular rhythm.   No murmur heard. Pulmonary/Chest: Effort normal and breath sounds normal.  Abdominal: Soft. Bowel sounds are normal. He exhibits no distension. There is no tenderness.  Musculoskeletal: Normal range of motion. He exhibits no edema or tenderness.  Neurological: He is alert. Coordination normal.  Skin: Skin is warm and dry. No rash noted.  Psychiatric: He has a normal mood and affect.  Nursing note and vitals reviewed.    ED Treatments / Results  Labs (all labs ordered are listed, but only abnormal results are displayed) Labs Reviewed  BASIC METABOLIC PANEL  CBC  CBG MONITORING, ED  I-STAT TROPONIN, ED    EKG  EKG Interpretation  Date/Time:  Wednesday  June 11 2017 16:56:54 EDT Ventricular Rate:  72 PR Interval:    QRS Duration: 144 QT Interval:  423 QTC Calculation: 463 R Axis:   -18 Text Interpretation:  Sinus rhythm Ventricular trigeminy Short PR interval IVCD, consider atypical RBBB Inferior infarct, old No significant change since last tracing Confirmed by Orlie Dakin [phone]) on 06/11/2017 5:01:24 PM       Radiology No results found.  Procedures Procedures (including critical care time)  Medications Ordered in ED Medications - No data to display Results for orders placed or performed during the hospital encounter of 26/83/41  Basic metabolic panel  Result Value Ref Range   Sodium 137 135 - 145 mmol/L   Potassium 4.3 3.5 - 5.1 mmol/L   Chloride 104 101 - 111 mmol/L   CO2 24 22 - 32 mmol/L   Glucose, Bld 184 (H) 65 - 99 mg/dL   BUN 25 (H) 6 - 20 mg/dL   Creatinine, Ser 1.20 0.61 - 1.24 mg/dL   Calcium 9.6 8.9 - 10.3 mg/dL   GFR calc non Af Amer 56 (L) >60 mL/min   GFR calc Af Amer >60 >60 mL/min   Anion gap 9 5 - 15  CBC  Result Value Ref Range   WBC 9.2 4.0 - 10.5 K/uL   RBC 4.58 4.22 - 5.81 MIL/uL   Hemoglobin 14.1 13.0 - 17.0 g/dL   HCT 41.4 39.0 - 52.0 %   MCV 90.4 78.0 - 100.0 fL   MCH 30.8 26.0 - 34.0 pg   MCHC 34.1 30.0 - 36.0 g/dL   RDW 13.6 11.5 - 15.5 %   Platelets 134 (L) 150 - 400 K/uL  I-stat troponin, ED  Result Value Ref Range   Troponin i, poc 0.00 0.00 - 0.08 ng/mL   Comment 3           No results found.  Initial Impression / Assessment and Plan / ED Course  I have reviewed the triage vital signs and the nursing notes.  Pertinent labs & imaging results that were available during my care of the patient were reviewed by me and considered in my medical decision making (see chart for details).   7:15 PM patient complains of bilateral foot pain which is typical pain from  neuropathy. One Percocet tablet ordered which she sometimes takes for neuropathy pain. I consulted Dr. Myna Hidalgo will  arrange for overnight stay    Final Clinical Impressions(s) / ED Diagnoses  Diagnosis #1 near syncope Final diagnoses:  None  #2 hyperglycemia  New Prescriptions New Prescriptions   No medications on file     Orlie Dakin, MD 06/11/17 [place]

## 2017-06-11 NOTE — ED Triage Notes (Signed)
Per [facility]: Pt was home with wife, to the bathroom to have a BM, felt dizzy, lightheaded, and clammy.  EMS reports pt was pale upon their arrival with a BP of 100/60.  Pt denies any CP, N/V, and SOB.  PTA ems administered 500ML of NS.

## 2017-06-11 NOTE — H&P (Signed)
History and Physical    SHOOTER TANGEN UMP:[id number] DOB: [birth year] DOA: 06/11/2017  PCP: [name], MD   Patient coming from: Home  Chief Complaint: Lightheaded, sweats   HPI: Kristopher GRIEP is a 76 y.o. male with medical history significant for paroxysmal atrial fibrillation on Xarelto, chronic combined systolic/diastolic CHF, hypertension, type 2 diabetes mellitus, AAA status post repair, now presenting to the emergency department for evaluation of lightheadedness with sweats and malaise. Patient reports that he been in his usual state of health and was having an uneventful day, seated at home, when he developed the acute onset of nonspecific malaise with diaphoresis. This went on for approximately 40 minutes or so before he went to the bathroom where he had a near syncopal episode. He denies any chest pain or palpitations associated with this and denies any dyspnea or hemoptysis. No recent fevers or chills. No experience with this symptom previously. Did not actually lose consciousness or fall. EMS was called out and the patient was noted to be pale and slightly hypotensive. He was treated with 500 mL of normal saline while en route to the hospital.  ED Course: Upon arrival to the ED, patient is found to be be afebrile, saturating well on room air, and with vitals otherwise stable. EKG features a sinus rhythm with PVCs and nonspecific interventricular conduction delay. Chemistry panels notable for a creatinine of 1.20, slightly higher from recent priors. CBC is notable for an improved chronic thrombocytopenia with platelets 134,000. Troponin is undetectable. Patient remained hemodynamically stable and in no apparent respiratory distress in the ED and will be observed on telemetry unit for ongoing evaluation and management of a near syncopal episode.  Review of Systems:  All other systems reviewed and apart from HPI, are negative.  Past Medical History:  Diagnosis Date  . AAA (abdominal aortic  aneurysm) ([place])    a. s/p repair 2009.  [name] Acid reflux    takes Prilosec and Protonix daily  . Arthritis    BacK  . CAD in native artery    a. a. prior inf MI 1993. b. PCI to LAD 05/1997. c. recurrent inferolateral MI complicated by V. fib arrest in 04/1998, prior PCI to OM1. d. 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  [name] Chronic combined systolic and diastolic CHF (congestive heart failure) ([place])   . Complication of anesthesia    -years ago hair fell out.;ileus after 2 of his surgeries  . Dry skin   . Foot drop, bilateral   . Hypercholesteremia   . Hypertension   . Ileus ([place])    After AAA  . Incisional hernia    "small, from AAA"  . Ischemic cardiomyopathy   . MI (myocardial infarction) ([place])   . 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 OSA on CPAP    uses CPAP--sleep study done at least 29yrs ago  . PAF (paroxysmal atrial fibrillation) ([place]   . Pain    back pain chronic- seen at pain clinic  . Plantar fasciitis    bilatetral  . Thrombocytopenia ([place])   . Type II diabetes mellitus ([place])     Past Surgical History:  Procedure Laterality Date  . ABDOMINAL AORTIC ANEURYSM REPAIR    . BACK SURGERY  2012-2013 X 3   Miminal Invasive x 3in [place].  [name] CARDIAC CATHETERIZATION  2009/2012  . CARDIOVERSION N/A 04/12/2015   Procedure: CARDIOVERSION;  Surgeon: Sueanne Margarita, MD;  Location: [facility];  Service: Cardiovascular;  Laterality:  N/A;  . CARDIOVERSION N/A 12/26/2016   Procedure: CARDIOVERSION;  Surgeon: Sueanne Margarita, MD;  Location[facility];  Service: Cardiovascular;  Laterality: N/A;  . COLONOSCOPY    . CORONARY ANGIOPLASTY WITH STENT PLACEMENT  1992; 1997  . LAPAROSCOPIC CHOLECYSTECTOMY    . LUMBAR LAMINECTOMY/DECOMPRESSION MICRODISCECTOMY Right 01/16/2016   Procedure: Right Lumbar five-Sacral one Laminectomy;  Surgeon: Kristeen Miss, MD;  Location: [facility];  Service: Neurosurgery;  Laterality: Right;  Right L5-S1 Laminectomy     reports that he quit smoking about 25 years  ago. His smoking use included Cigarettes. He has a 33.00 pack-year smoking history. He has never used smokeless tobacco. He reports that he drinks alcohol. He reports that he does not use drugs.  Allergies  Allergen Reactions  . Cymbalta [Duloxetine Hcl] Nausea And Vomiting and Other (See Comments)    PATIENT STATED THAT HE FELT LIKE HE "WAS GOING TO DIE" Rapid drop in blood pressure; sent him to the ER X2  . Neurontin [Gabapentin] Other (See Comments)    "Makes me goofy, keeps me off balance, clouds my thinking.."  . Lyrica [Pregabalin] Other (See Comments)    "Makes me goofy, keeps me off balance, clouds my thinking..."  . Keppra [Levetiracetam] Nausea And Vomiting    Family History  Problem Relation Age of Onset  . Diabetes Mother   . Coronary artery disease Mother   . Diabetes Father   . Arthritis Father   . Rheum arthritis Father   . Coronary artery disease Father      Prior to Admission medications   Medication Sig Start Date End Date Taking? Authorizing Provider  amiodarone (PACERONE) 200 MG tablet Take 1 tablet (200 mg total) by mouth daily. 02/27/17  Yes Belva Crome, MD  amitriptyline (ELAVIL) 25 MG tablet Take 75 mg by mouth at bedtime.  02/19/17  Yes [provider]  atorvastatin (LIPITOR) 10 MG tablet Take 10 mg by mouth daily.   Yes [provider]  Coenzyme Q10 (CO Q 10) 100 MG CAPS Take 100 mg by mouth daily.   Yes [provider]  diazepam (VALIUM) 5 MG tablet Take 5 mg by mouth at bedtime as needed (leg cramps/sleep).  10/31/15  Yes [provider]  empagliflozin (JARDIANCE) 10 MG TABS tablet Take 10 mg by mouth daily with breakfast.    Yes [provider]  finasteride (PROPECIA) 1 MG tablet Take 1 mg by mouth daily as needed (for hair loss).   Yes [provider]  glimepiride (AMARYL) 2 MG tablet Take 2 mg by mouth daily with breakfast.  01/10/15  Yes [provider]  Liniments (SALONPAS EX) Apply 1  application topically every 4 (four) hours as needed (for nerve pain). FORMULATION WITH LIDOCAINE   Yes [provider]  metFORMIN (GLUCOPHAGE-XR) 500 MG 24 hr tablet Take 500-1,000 mg by mouth See admin instructions. 1,000 mg in the morning and 500 mg in the evening 10/12/15  Yes [provider]  metoprolol tartrate (LOPRESSOR) 50 MG tablet TAKE ONE TABLET BY MOUTH TWICE DAILY Patient taking differently: Take 50 mg by mouth two times a day 05/14/17  Yes Belva Crome, MD  Multiple Vitamins-Minerals (ONE-A-DAY MENS 50+ ADVANTAGE) TABS Take 1 tablet by mouth daily.   Yes [provider]  omeprazole (PRILOSEC) 20 MG capsule Take 20 mg by mouth daily. 09/14/12  Yes [provider]  oxyCODONE-acetaminophen (PERCOCET/ROXICET) 5-325 MG tablet Take 1 tablet by mouth every 6 (six) hours as  needed (pain).    Yes [provider]  ramipril (ALTACE) 10 MG capsule Take 10 mg by mouth daily.    Yes [provider]  sildenafil (REVATIO) 20 MG tablet Take 20-100 mg by mouth daily as needed (for ED).   Yes [provider]  vitamin B-12 (CYANOCOBALAMIN) 1000 MCG tablet Take 1,000 mcg by mouth daily.    Yes [provider]  XARELTO 20 MG TABS tablet TAKE ONE TABLET BY MOUTH ONCE DAILY WITH SUPPER Patient taking differently: Take 20 mg by mouth once a day with supper 09/27/16  Yes Belva Crome, MD    Physical Exam: Vitals:   06/11/17 1815 06/11/17 1830 06/11/17 1845 06/11/17 1900  BP: 117/66 108/72 121/61 116/75  Pulse: 77 76 72 70  Resp: 17 (!) 21 18 (!) 21  SpO2: 97% 97% 97% 97%      Constitutional: NAD, calm, comfortable Eyes: PERTLA, lids and conjunctivae normal ENMT: Mucous membranes are moist. Posterior pharynx clear of any exudate or lesions.   Neck: normal, supple, no masses, no thyromegaly Respiratory: clear to auscultation bilaterally, no wheezing, no crackles. No accessory muscle use.  Cardiovascular: Rate ~80 and regular.  No extremity edema. No significant JVD. Abdomen: No distension, no tenderness, no masses palpated. Bowel sounds normal.  Musculoskeletal: no clubbing / cyanosis. No joint deformity upper and lower extremities.   Skin: no significant rashes, lesions, ulcers. Warm, dry, well-perfused. Neurologic: CN 2-12 grossly intact. Sensation intact, DTR normal. Strength 5/5 in all 4 limbs.  Psychiatric: Alert and oriented x 3. Normal mood and affect.     Labs on Admission: I have personally reviewed following labs and imaging studies  CBC:  Recent Labs Lab 06/11/17 1721  WBC 9.2  HGB 14.1  HCT 41.4  MCV 90.4  PLT 426*   Basic Metabolic Panel:  Recent Labs Lab 06/11/17 1721  NA 137  K 4.3  CL 104  CO2 24  GLUCOSE 184*  BUN 25*  CREATININE 1.20  CALCIUM 9.6   GFR: CrCl cannot be calculated (Unknown ideal weight.). Liver Function Tests: No results for input(s): AST, ALT, ALKPHOS, BILITOT, PROT, ALBUMIN in the last 168 hours. No results for input(s): LIPASE, AMYLASE in the last 168 hours. No results for input(s): AMMONIA in the last 168 hours. Coagulation Profile: No results for input(s): INR, PROTIME in the last 168 hours. Cardiac Enzymes: No results for input(s): CKTOTAL, CKMB, CKMBINDEX, TROPONINI in the last 168 hours. BNP (last 3 results) No results for input(s): PROBNP in the last 8760 hours. HbA1C: No results for input(s): HGBA1C in the last 72 hours. CBG: No results for input(s): GLUCAP in the last 168 hours. Lipid Profile: No results for input(s): CHOL, HDL, LDLCALC, TRIG, CHOLHDL, LDLDIRECT in the last 72 hours. Thyroid Function Tests: No results for input(s): TSH, T4TOTAL, FREET4, T3FREE, THYROIDAB in the last 72 hours. Anemia Panel: No results for input(s): VITAMINB12, FOLATE, FERRITIN, TIBC, IRON, RETICCTPCT in the last 72 hours. Urine analysis:    Component Value Date/Time   COLORURINE YELLOW 12/21/2016 1850   APPEARANCEUR CLEAR 12/21/2016 1850   LABSPEC  1.027 12/21/2016 1850   PHURINE 5.0 12/21/2016 1850   GLUCOSEU >=500 (A) 12/21/2016 1850   HGBUR MODERATE (A) 12/21/2016 1850   BILIRUBINUR NEGATIVE 12/21/2016 1850   KETONESUR 5 (A) 12/21/2016 1850   PROTEINUR NEGATIVE 12/21/2016 1850   UROBILINOGEN 0.2 12/12/2014 0150   NITRITE NEGATIVE 12/21/2016 1850   LEUKOCYTESUR NEGATIVE 12/21/2016 1850   Sepsis Labs: @LABRCNTIP (procalcitonin:4,lacticidven:4) )No results  found for this or any previous visit (from the past 240 hour(s)).   Radiological Exams on Admission: No results found.  EKG: Independently reviewed. Sinus rhythm, PVC's, non-specific IVCD.   Assessment/Plan  1. Near-syncope  - Pt presents following a near-syncopal event at home, preceded by ~40 min of malaise and diaphoresis - No chest pain or palpitations, and no headache, change in vision or hearing, or focal numbness or weakness  - He is anticoagulated with Xarelto, and without CP or dyspnea, PE unlikely  - Plan to check orthostatic vitals, continue cardiac monitoring, updated echocardiogram    2. Paroxysmal atrial fibrillation  - In a sinus rhythm on admission  - CHADS-VASc is 73 (age x2, CHF, DM, HTN)  - Continue Xarelto and amiodarone    3. Chronic combined systolic/diastolic CHF - Appears well-compensated  - Follow daily wts and I/O's  - Continue beta-blocker and ACE  4. Hypertension  - BP at goal  - Continue Lopressor and ramipril    5. Type II DM  - A1c was 7.3% in February 2018  - Managed at home with Jardiance, metformin, and glimepiride  - Check CBG's with meals and qHS - Start a moderate-intensity SSI with Novolog     DVT prophylaxis: Xarelto  Code Status: Full  Family Communication: Discussed with patient Disposition Plan: Observe on telemetry Consults called: None Admission status: Observation    Vianne Bulls, MD [facility] Pager [phone]  If 7PM-7AM, please contact night-coverage [email or web address] Password  [facility]  06/11/2017, 7:16 PM

## 2017-06-11 NOTE — ED Notes (Signed)
Attempted report x1. 

## 2017-06-12 ENCOUNTER — Observation Stay (HOSPITAL_BASED_OUTPATIENT_CLINIC_OR_DEPARTMENT_OTHER): Payer: Medicare Other

## 2017-06-12 DIAGNOSIS — E1165 Type 2 diabetes mellitus with hyperglycemia: Secondary | ICD-10-CM

## 2017-06-12 DIAGNOSIS — I5042 Chronic combined systolic (congestive) and diastolic (congestive) heart failure: Secondary | ICD-10-CM

## 2017-06-12 DIAGNOSIS — R55 Syncope and collapse: Secondary | ICD-10-CM

## 2017-06-12 DIAGNOSIS — E1140 Type 2 diabetes mellitus with diabetic neuropathy, unspecified: Secondary | ICD-10-CM

## 2017-06-12 DIAGNOSIS — I10 Essential (primary) hypertension: Secondary | ICD-10-CM | POA: Diagnosis not present

## 2017-06-12 DIAGNOSIS — I480 Paroxysmal atrial fibrillation: Secondary | ICD-10-CM

## 2017-06-12 LAB — CBC
HCT: 41.6 % (ref 39.0–52.0)
Hemoglobin: 14.2 g/dL (ref 13.0–17.0)
MCH: 31.1 pg (ref 26.0–34.0)
MCHC: 34.1 g/dL (ref 30.0–36.0)
MCV: 91 fL (ref 78.0–100.0)
Platelets: 134 10*3/uL — ABNORMAL LOW (ref 150–400)
RBC: 4.57 MIL/uL (ref 4.22–5.81)
RDW: 14 % (ref 11.5–15.5)
WBC: 5.3 10*3/uL (ref 4.0–10.5)

## 2017-06-12 LAB — ECHOCARDIOGRAM COMPLETE
Height: 73 in
Weight: 3249.6 oz

## 2017-06-12 LAB — BASIC METABOLIC PANEL
Anion gap: 7 (ref 5–15)
BUN: 22 mg/dL — ABNORMAL HIGH (ref 6–20)
CO2: 27 mmol/L (ref 22–32)
Calcium: 9.5 mg/dL (ref 8.9–10.3)
Chloride: 104 mmol/L (ref 101–111)
Creatinine, Ser: 1.07 mg/dL (ref 0.61–1.24)
GFR calc Af Amer: >60 mL/min (ref >60)
GFR calc non Af Amer: >60 mL/min (ref >60)
Glucose, Bld: 158 mg/dL — ABNORMAL HIGH (ref 65–99)
Potassium: 4.2 mmol/L (ref 3.5–5.1)
Sodium: 138 mmol/L (ref 135–145)

## 2017-06-12 LAB — GLUCOSE, CAPILLARY
Glucose-Capillary: 132 mg/dL — ABNORMAL HIGH (ref 65–99)
Glucose-Capillary: 191 mg/dL — ABNORMAL HIGH (ref 65–99)
Glucose-Capillary: 213 mg/dL — ABNORMAL HIGH (ref 65–99)
Glucose-Capillary: 204 mg/dL — ABNORMAL HIGH (ref 65–99)

## 2017-06-12 LAB — HEMOGLOBIN A1C
Hgb A1c MFr Bld: 6.8 % — ABNORMAL HIGH (ref 4.8–5.6)
Mean Plasma Glucose: 148.46 mg/dL

## 2017-06-12 MED ORDER — PERFLUTREN LIPID MICROSPHERE
1.0000 mL | INTRAVENOUS | Status: AC | PRN
  Administered 2017-06-12: 2 mL via INTRAVENOUS
  Filled 2017-06-12: qty 10

## 2017-06-12 MED ORDER — SODIUM CHLORIDE 0.9 % IV SOLN
INTRAVENOUS | Status: DC
  Administered 2017-06-12 – 2017-06-13 (×2): via INTRAVENOUS

## 2017-06-12 NOTE — Care Management Obs Status (Signed)
[facility] NOTIFICATION   Patient Details  Name: [name] MRN: [id number] Date of Birth: [birth year]   Medicare Observation Status Notification Given:  Yes    Carles Collet, RN 06/12/2017, 1:54 PM

## 2017-06-12 NOTE — Progress Notes (Signed)
Pt. transported from ER via stretcher to 3E-10, and walked to bed with standby assist; pt.'s balance unsteady; alert and oriented x4; pt. oriented to room and call button. Informed about safety- not to get OOB without assist, and bed alarm on.

## 2017-06-12 NOTE — Evaluation (Signed)
Physical Therapy Evaluation Patient Details Name: [name] MRN: [id number] DOB: [birth year] Today's Date: 06/12/2017   History of Present Illness  Pt is a 76 y/o male admitted secondary to lightheadedness and general malaise. PMH including but not limited to CAD with hx of MI, HTN, DM and CHF.  Clinical Impression  Pt presented supine in bed with HOB elevated, awake and willing to participate in therapy session. Pt's wife was present throughout session. Pt ambulated out in hallway with min guard for safety and preference for pushing IV pole, although would be much safer and steadier with RW. Pt would benefit from OP PT to address his balance and coordination deficits. No further acute PT needs identified at this time. PT signing off.    Follow Up Recommendations Outpatient PT    Equipment Recommendations  None recommended by PT;Other (comment) (pt has all necessary DME at home)    Recommendations for Other Services       Precautions / Restrictions Precautions Precautions: Fall Precaution Comments: pt's wife reports hx of falls x5 in the past 6 months Restrictions Weight Bearing Restrictions: No      Mobility  Bed Mobility Overal bed mobility: Modified Independent                Transfers Overall transfer level: Needs assistance Equipment used: None Transfers: Sit to/from Stand Sit to Stand: Supervision         General transfer comment: increased time, supervision for safety  Ambulation/Gait Ambulation/Gait assistance: Min guard Ambulation Distance (Feet): 75 Feet Assistive device:  (pushed IV pole) Gait Pattern/deviations: Step-through pattern;Decreased stride length;Drifts right/left;Narrow base of support Gait velocity: decreased   General Gait Details: modest instability but no overt LOB or need for physical assistance, pt with preference for pushing IV pole as he normally ambulates with use of [facility]  Stairs            Wheelchair Mobility     Modified Rankin (Stroke Patients Only)       Balance Overall balance assessment: Needs assistance;History of Falls Sitting-balance support: Feet supported Sitting balance-Leahy Scale: Good Sitting balance - Comments: pt able to sit EOB with supervision   Standing balance support: During functional activity;Single extremity supported Standing balance-Leahy Scale: Poor Standing balance comment: pt required at least one UE support                             Pertinent Vitals/Pain Pain Assessment: No/denies pain    Home Living Family/patient expects to be discharged to:: Private residence Living Arrangements: Spouse/significant other Available Help at Discharge: Family;Available 24 hours/day Type of Home: House Home Access: Stairs to enter Entrance Stairs-Rails: Right;Left;Can reach both Entrance Stairs-Number of Steps: 4 Home Layout: Able to live on main level with bedroom/bathroom Home Equipment: Grab bars - toilet;Walker - 2 wheels;Cane - single point;Crutches;Shower seat      Prior Function Level of Independence: Independent with assistive device(s)         Comments: pt ambulates with SPC out in the community, no AD at home     Hand Dominance   Dominant Hand: Right    Extremity/Trunk Assessment   Upper Extremity Assessment Upper Extremity Assessment: Overall WFL for tasks assessed    Lower Extremity Assessment Lower Extremity Assessment: Generalized weakness       Communication   Communication: No difficulties  Cognition Arousal/Alertness: Awake/alert Behavior During Therapy: WFL for tasks assessed/performed Overall Cognitive Status: Within Functional Limits for  tasks assessed                                        General Comments      Exercises     Assessment/Plan    PT Assessment All further PT needs can be met in the next venue of care  PT Problem List Decreased balance;Decreased mobility;Decreased  coordination;Decreased knowledge of use of DME;Decreased safety awareness       PT Treatment Interventions      PT Goals (Current goals can be found in the Care Plan section)  Acute Rehab PT Goals Patient Stated Goal: return home    Frequency     Barriers to discharge        Co-evaluation               AM-PAC PT "6 Clicks" Daily Activity  Outcome Measure Difficulty turning over in bed (including adjusting bedclothes, sheets and blankets)?: None Difficulty moving from lying on back to sitting on the side of the bed? : None Difficulty sitting down on and standing up from a chair with arms (e.g., wheelchair, bedside commode, etc,.)?: A Little Help needed moving to and from a bed to chair (including a wheelchair)?: A Little Help needed walking in hospital room?: A Little Help needed climbing 3-5 steps with a railing? : A Little 6 Click Score: 20    End of Session Equipment Utilized During Treatment: Gait belt Activity Tolerance: Patient tolerated treatment well Patient left: in bed;with call bell/phone within reach;with family/visitor present Nurse Communication: Mobility status PT Visit Diagnosis: Other abnormalities of gait and mobility (R26.89);History of falling (Z91.81)    Time: 8871-9597 PT Time Calculation (min) (ACUTE ONLY): 34 min   Charges:   PT Evaluation $PT Eval Moderate Complexity: 1 Mod PT Treatments $Gait Training: 8-22 mins   PT G Codes:   PT G-Codes **NOT FOR INPATIENT CLASS** Functional Assessment Tool Used: AM-PAC 6 Clicks Basic Mobility Functional Limitation: Mobility: Walking and moving around Mobility: Walking and Moving Around Current Status (I7185): At least 20 percent but less than 40 percent impaired, limited or restricted Mobility: Walking and Moving Around Goal Status [phone]): 0 percent impaired, limited or restricted Mobility: Walking and Moving Around Discharge Status [phone]): At least 20 percent but less than 40 percent impaired,  limited or restricted    [facility], [place], DPT [place] 06/12/2017, 5:40 PM

## 2017-06-12 NOTE — Progress Notes (Signed)
PROGRESS NOTE    [name]  QVZ:[id number] DOB: [birth year] DOA: 06/11/2017 PCP: [name], MD    Brief Narrative:[name] is a 76 y.o. male with medical history significant for paroxysmal atrial fibrillation on Xarelto, chronic combined systolic/diastolic CHF, hypertension, type 2 diabetes mellitus, AAA status post repair, now presenting to the emergency department for evaluation of lightheadedness with sweats and malaise.  Assessment & Plan:   Principal Problem:   Near syncope Active Problems:   Hypertension   DM (diabetes mellitus), type 2, uncontrolled w/neurologic complication (HCC)   Paroxysmal atrial fibrillation with RVR (HCC)   Chronic combined systolic and diastolic heart failure (HCC)   Near syncope: Possibly from hypoglycemia, pt reports low blood sugar at home. Differential include dehydration vs orthostatic hypotension.  hgba1c is 6.8.  Stop glimepiride.   Diabetes Mellitus:  CBG (last 3)   Recent Labs  06/12/17 0800 06/12/17 1119 06/12/17 1654  GLUCAP 132* 191* 213*    Resume SSI.    PAF; Rate controlled. Resume xarelto.   Chronic systolic and diastolic heart failure:  Compensated. He appears slightly dehydrated.gentle hydration.    Orthostatic hypotension:  Gentle hydration over night and repeat orthostatics in am.    DVT prophylaxis: (Lovenox/) Code Status: (Full) Family Communication: discussed with wife at bedside.  Disposition Plan: possibly home in am.    Consultants:   None.    Procedures: echocardiogram.    Antimicrobials: none.    Subjective: No new complaints.   Objective: Vitals:   06/11/17 2015 06/11/17 2118 06/12/17 0513 06/12/17 0802  BP:  (!) 125/57 (!) 101/48 (!) 111/57  Pulse: 78 75 73 61  Resp: 17 18 18 18   Temp:  97.8 F (36.6 C) 97.7 F (36.5 C) (!) 97.4 F (36.3 C)  TempSrc:  Oral Oral Oral  SpO2: 99% 98% 100% 97%  Weight:  93.2 kg (205 lb 6.4 oz) 92.1 kg (203 lb 1.6 oz)   Height:  6\' 1"   (1.854 m)      Intake/Output Summary (Last 24 hours) at 06/12/17 1842 Last data filed at 06/12/17 1802  Gross per 24 hour  Intake             1098 ml  Output                0 ml  Net             1098 ml   Filed Weights   06/11/17 2118 06/12/17 0513  Weight: 93.2 kg (205 lb 6.4 oz) 92.1 kg (203 lb 1.6 oz)    Examination:  General exam: Appears calm and comfortable  Respiratory system: Clear to auscultation. Respiratory effort normal. Cardiovascular system: S1 & S2 heard, RRR. No JVD, murmurs, rubs, gallops or clicks. No pedal edema. Gastrointestinal system: Abdomen is nondistended, soft and nontender. No organomegaly or masses felt. Normal bowel sounds heard. Central nervous system: Alert and oriented. No focal neurological deficits. Extremities: Symmetric 5 x 5 power. Skin: No rashes, lesions or ulcers Psychiatry: Judgement and insight appear normal. Mood & affect appropriate.     Data Reviewed: I have personally reviewed following labs and imaging studies  CBC:  Recent Labs Lab 06/11/17 1721 06/12/17 0445  WBC 9.2 5.3  HGB 14.1 14.2  HCT 41.4 41.6  MCV 90.4 91.0  PLT 134* 329*   Basic Metabolic Panel:  Recent Labs Lab 06/11/17 1721 06/12/17 0445  NA 137 138  K 4.3 4.2  CL 104 104  CO2 24 27  GLUCOSE 184* 158*  BUN 25* 22*  CREATININE 1.20 1.07  CALCIUM 9.6 9.5   GFR: Estimated Creatinine Clearance: 64.3 mL/min (by C-G formula based on SCr of 1.07 mg/dL). Liver Function Tests: No results for input(s): AST, ALT, ALKPHOS, BILITOT, PROT, ALBUMIN in the last 168 hours. No results for input(s): LIPASE, AMYLASE in the last 168 hours. No results for input(s): AMMONIA in the last 168 hours. Coagulation Profile: No results for input(s): INR, PROTIME in the last 168 hours. Cardiac Enzymes: No results for input(s): CKTOTAL, CKMB, CKMBINDEX, TROPONINI in the last 168 hours. BNP (last 3 results) No results for input(s): PROBNP in the last 8760  hours. HbA1C:  Recent Labs  06/12/17 0445  HGBA1C 6.8*   CBG:  Recent Labs Lab 06/11/17 2138 06/12/17 0800 06/12/17 1119 06/12/17 1654  GLUCAP 158* 132* 191* 213*   Lipid Profile: No results for input(s): CHOL, HDL, LDLCALC, TRIG, CHOLHDL, LDLDIRECT in the last 72 hours. Thyroid Function Tests: No results for input(s): TSH, T4TOTAL, FREET4, T3FREE, THYROIDAB in the last 72 hours. Anemia Panel: No results for input(s): VITAMINB12, FOLATE, FERRITIN, TIBC, IRON, RETICCTPCT in the last 72 hours. Sepsis Labs: No results for input(s): PROCALCITON, LATICACIDVEN in the last 168 hours.  No results found for this or any previous visit (from the past 240 hour(s)).       Radiology Studies: X-ray Chest Pa And Lateral  Result Date: 06/11/2017 CLINICAL DATA:  Dizzy, lightheaded EXAM: CHEST  2 VIEW COMPARISON:  12/22/2016 FINDINGS: Hyperinflation. No focal infiltrate or effusion. Cardiomediastinal silhouette stable with aortic atherosclerosis. No pneumothorax. Degenerative changes of the spine. IMPRESSION: No active cardiopulmonary disease. Electronically Signed   By: Donavan Foil M.D.   On: 06/11/2017 20:42        Scheduled Meds: . amiodarone  200 mg Oral Daily  . amitriptyline  75 mg Oral QHS  . atorvastatin  10 mg Oral q1800  . insulin aspart  0-15 Units Subcutaneous TID WC  . insulin aspart  0-5 Units Subcutaneous QHS  . insulin aspart  3 Units Subcutaneous TID WC  . metoprolol tartrate  50 mg Oral BID  . multivitamin with minerals  1 tablet Oral Daily  . pantoprazole  40 mg Oral Daily  . ramipril  10 mg Oral Daily  . rivaroxaban  20 mg Oral Q supper  . sodium chloride flush  3 mL Intravenous Q12H  . vitamin B-12  1,000 mcg Oral Daily   Continuous Infusions: . sodium chloride 75 mL/hr at 06/12/17 1838     LOS: 0 days    Time spent: 35 minutes.     Hosie Poisson, MD [facility] Pager [phone]  If 7PM-7AM, please contact  night-coverage [email or web address] Password [facility] 06/12/2017, 6:42 PM

## 2017-06-12 NOTE — Plan of Care (Signed)
Problem: Education: Goal: Knowledge of [facility] Education information/materials will improve Outcome: Progressing POC and orders reviewed with pt, and tests ordered.

## 2017-06-12 NOTE — Progress Notes (Signed)
  Echocardiogram 2D Echocardiogram has been performed.  [name] 06/12/2017, 12:52 PM

## 2017-06-12 NOTE — Progress Notes (Signed)
Patient orthostatic vital signs positive. Dr. Karleen Hampshire aware who stated not to discharge patient tonight.  Will continue to monitor.

## 2017-06-13 ENCOUNTER — Observation Stay (HOSPITAL_COMMUNITY): Payer: Medicare Other

## 2017-06-13 DIAGNOSIS — I480 Paroxysmal atrial fibrillation: Secondary | ICD-10-CM | POA: Diagnosis not present

## 2017-06-13 DIAGNOSIS — I10 Essential (primary) hypertension: Secondary | ICD-10-CM | POA: Diagnosis not present

## 2017-06-13 DIAGNOSIS — E1140 Type 2 diabetes mellitus with diabetic neuropathy, unspecified: Secondary | ICD-10-CM | POA: Diagnosis not present

## 2017-06-13 DIAGNOSIS — R55 Syncope and collapse: Secondary | ICD-10-CM | POA: Diagnosis not present

## 2017-06-13 LAB — GLUCOSE, CAPILLARY
Glucose-Capillary: 151 mg/dL — ABNORMAL HIGH (ref 65–99)
Glucose-Capillary: 161 mg/dL — ABNORMAL HIGH (ref 65–99)
Glucose-Capillary: 252 mg/dL — ABNORMAL HIGH (ref 65–99)
Glucose-Capillary: 187 mg/dL — ABNORMAL HIGH (ref 65–99)

## 2017-06-13 MED ORDER — AMOXICILLIN-POT CLAVULANATE 875-125 MG PO TABS: 1.0000 | ORAL_TABLET | Freq: Two times a day (BID) | ORAL | 0 refills | Status: AC

## 2017-06-13 NOTE — Care Management Note (Signed)
Case Management Note  Patient Details  Name: [name] MRN: [id number] Date of Birth: 1941/10/26  Subjective/Objective:                 Spoke w patient and wife at the bedside. They requested [facility], provided. They stated they would like to visit facilities prior to choosing, therefore referral not placed. Patient DC'd with written Rx for OP PT. They know to present Rx to facility of choice. No other CM needs.    Action/Plan:   Expected Discharge Date:  06/12/17               Expected Discharge Plan:  Home/Self Care  In-House Referral:     Discharge planning Services  CM Consult  Post Acute Care Choice:    Choice offered to:     DME Arranged:    DME Agency:     HH Arranged:    HH Agency:     Status of Service:  Completed, signed off  If discussed at [employer] of Stay Meetings, dates discussed:    Additional Comments:  Carles Collet, RN 06/13/2017, 11:34 AM

## 2017-06-16 NOTE — Discharge Summary (Signed)
Physician Discharge Summary  [name] OZD:[id number] DOB: 01-21-1941 DOA: 06/11/2017  PCP: [name], MD  Admit date: 06/11/2017 Discharge date: 06/13/2017  Admitted From: Home.  Disposition:  Home.   Recommendations for Outpatient Follow-up:  1. Follow up with PCP in 1-2 weeks 2. Please obtain BMP/CBC in one week Please follow up with general surgery clinic as recommended.    Discharge Condition:stable.  CODE STATUS: full code.  Diet recommendation: Heart Healthy   Brief/Interim Summary: :[name] a 76 y.o.malewith medical history significant for paroxysmal atrial fibrillation on Xarelto, chronic combined systolic/diastolic CHF, hypertension, type 2 diabetes mellitus, AAA status post repair, now presenting to the emergency department for evaluation of lightheadedness with sweats and malaise.   Discharge Diagnoses:  Principal Problem:   Near syncope Active Problems:   Hypertension   DM (diabetes mellitus), type 2, uncontrolled w/neurologic complication (HCC)   Paroxysmal atrial fibrillation with RVR (HCC)   Chronic combined systolic and diastolic heart failure ([place])  Near syncope: Possibly from hypoglycemia, pt reports low blood sugar at home. Differential include dehydration vs orthostatic hypotension.  hgba1c is 6.8.  Stop glimepiride. Gentle hydration and repeat orthostatics negative.  No arrhythmias on telemetry.     Diabetes Mellitus:  CBG (last 3)   Recent Labs (last 2 labs)    Recent Labs  06/12/17 0800 06/12/17 1119 06/12/17 1654  GLUCAP 132* 191* 213*      Resume SSI.    PAF; Rate controlled. Resume xarelto.   Chronic systolic and diastolic heart failure:  Compensated. He appears slightly dehydrated.gentle hydration.    Orthostatic hypotension:  Gentle hydration over night and repeat orthostatics are negative.    Small lateral chest wall cyst: US of the nodule reviewed.  Called surgery for aspiration vs biopsy.   Recommended outpatient follow up with in the clinic.  Prescribed course of antibiotics suspecting its abscess.     Discharge Instructions  Discharge Instructions    Ambulatory referral to Physical Therapy    Complete by:  As directed    Diet - low sodium heart healthy    Complete by:  As directed    Diet - low sodium heart healthy    Complete by:  As directed    Discharge instructions    Complete by:  As directed    Please obtain biopsy of the skin on the left lateral chest wall by PCP.  Please follow up with PCP in one week.  We have stopped glimepiride.   Discharge instructions    Complete by:  As directed    [place] TO GET THE CYST REMOVED OR ASPIRATED.  Please follow up with  Your Primary care physician in 1 to 2 weeks.     Allergies as of 06/13/2017      Reactions   Cymbalta [duloxetine Hcl] Nausea And Vomiting, Other (See Comments)   PATIENT STATED THAT HE FELT LIKE HE "WAS GOING TO DIE" Rapid drop in blood pressure; sent him to the ER X2   Neurontin [gabapentin] Other (See Comments)   "Makes me goofy, keeps me off balance, clouds my thinking.."   Lyrica [pregabalin] Other (See Comments)   "Makes me goofy, keeps me off balance, clouds my thinking..."   Keppra [levetiracetam] Nausea And Vomiting      Medication List    STOP taking these medications   glimepiride 2 MG tablet Commonly known as:  AMARYL     TAKE these medications   amiodarone 200 MG  tablet Commonly known as:  PACERONE Take 1 tablet (200 mg total) by mouth daily.   amitriptyline 25 MG tablet Commonly known as:  ELAVIL Take 75 mg by mouth at bedtime.   amoxicillin-clavulanate 875-125 MG tablet Commonly known as:  AUGMENTIN Take 1 tablet by mouth every 12 (twelve) hours.   atorvastatin 10 MG tablet Commonly known as:  LIPITOR Take 10 mg by mouth daily.   Co Q 10 100 MG Caps Take 100 mg by mouth daily.   diazepam 5 MG tablet Commonly known as:  VALIUM Take  5 mg by mouth at bedtime as needed (leg cramps/sleep).   finasteride 1 MG tablet Commonly known as:  PROPECIA Take 1 mg by mouth daily as needed (for hair loss).   JARDIANCE 10 MG Tabs tablet Generic drug:  empagliflozin Take 10 mg by mouth daily with breakfast.   metFORMIN 500 MG 24 hr tablet Commonly known as:  GLUCOPHAGE-XR Take 500-1,000 mg by mouth See admin instructions. 1,000 mg in the morning and 500 mg in the evening   metoprolol tartrate 50 MG tablet Commonly known as:  LOPRESSOR TAKE ONE TABLET BY MOUTH TWICE DAILY What changed:  See the new instructions.   omeprazole 20 MG capsule Commonly known as:  PRILOSEC Take 20 mg by mouth daily.   ONE-A-DAY MENS 50+ ADVANTAGE Tabs Take 1 tablet by mouth daily.   oxyCODONE-acetaminophen 5-325 MG tablet Commonly known as:  PERCOCET/ROXICET Take 1 tablet by mouth every 6 (six) hours as needed (pain).   ramipril 10 MG capsule Commonly known as:  ALTACE Take 10 mg by mouth daily.   SALONPAS EX Apply 1 application topically every 4 (four) hours as needed (for nerve pain). FORMULATION WITH LIDOCAINE   sildenafil 20 MG tablet Commonly known as:  REVATIO Take 20-100 mg by mouth daily as needed (for ED).   vitamin B-12 1000 MCG tablet Commonly known as:  CYANOCOBALAMIN Take 1,000 mcg by mouth daily.   XARELTO 20 MG Tabs tablet Generic drug:  rivaroxaban TAKE ONE TABLET BY MOUTH ONCE DAILY WITH SUPPER What changed:  See the new instructions.      Follow-up Information    [name], MD Follow up on 06/19/2017.   Specialty:  Internal Medicine Why:  At 10:30 AM. Contact information: [address] [place] [address] [facility], [place]. Call.   Specialty:  General Surgery Why:  call to make an appointment to evaluate abscess/sebaceous cyst Contact information: [place] [phone]          Allergies  Allergen Reactions  . Cymbalta [Duloxetine Hcl] Nausea And Vomiting and Other (See Comments)    PATIENT STATED THAT HE FELT LIKE HE "WAS GOING TO DIE" Rapid drop in blood pressure; sent him to the ER X2  . Neurontin [Gabapentin] Other (See Comments)    "Makes me goofy, keeps me off balance, clouds my thinking.."  . Lyrica [Pregabalin] Other (See Comments)    "Makes me goofy, keeps me off balance, clouds my thinking..."  . Keppra [Levetiracetam] Nausea And Vomiting    Consultations:  None.    Procedures/Studies: X-ray Chest Pa And Lateral  Result Date: 06/11/2017 CLINICAL DATA:  Dizzy, lightheaded EXAM: CHEST  2 VIEW COMPARISON:  12/22/2016 FINDINGS: Hyperinflation. No focal infiltrate or effusion. Cardiomediastinal silhouette stable with aortic atherosclerosis. No pneumothorax. Degenerative changes of the spine. IMPRESSION: No active cardiopulmonary disease. Electronically Signed  By: Donavan Foil M.D.   On: 06/11/2017 20:42   [place] Chest  Result Date: 06/13/2017 CLINICAL DATA:  Evaluate chest wall mass. EXAM: CHEST ULTRASOUND COMPARISON:  06/11/2017 FINDINGS: Limited, focused ultrasound was performed of the left ventral chest wall over the area of concern along the left lower chest. Mass:  None Complex cyst: Yes. There is a hypoechoic complex structure corresponding to the area of concern which measures 1.3 x 0.7 x 1.4 cm. No internal blood flow identified. IMPRESSION: 1. Complex cystic structure corresponds to the area of concern over the left lower chest. This may represent a subcutaneous abscess and/or infected sebaceous cyst. Hematoma could also have this appearance. Careful clinical correlation is advised. If this does not resolve after appropriate therapy then further imaging with contrast enhanced chest wall CT or MRI with possible needle aspiration would be advised to rule out underlying chest wall lesion. Electronically Signed   By: Kerby Moors M.D.   On: 06/13/2017  14:42       Subjective: No chest pain or sob.   Discharge Exam: Vitals:   06/13/17 0421 06/13/17 1249  BP: (!) 99/56 (!) 100/47  Pulse: 64 64  Resp: 18 16  Temp: 97.7 F (36.5 C) (!) 97.4 F (36.3 C)  SpO2: 98% 97%   Vitals:   06/12/17 2200 06/13/17 0200 06/13/17 0421 06/13/17 1249  BP: (!) 118/59 (!) 104/51 (!) 99/56 (!) 100/47  Pulse: 81 66 64 64  Resp:   18 16  Temp:   97.7 F (36.5 C) (!) 97.4 F (36.3 C)  TempSrc:   Oral Oral  SpO2:   98% 97%  Weight:   92.3 kg (203 lb 6.4 oz)   Height:        General: Pt is alert, awake, not in acute distress Cardiovascular: RRR, S1/S2 +, no rubs, no gallops Respiratory: CTA bilaterally, no wheezing, no rhonchi Abdominal: Soft, NT, ND, bowel sounds + Extremities: no edema, no cyanosis    The results of significant diagnostics from this hospitalization (including imaging, microbiology, ancillary and laboratory) are listed below for reference.     Microbiology: No results found for this or any previous visit (from the past 240 hour(s)).   Labs: BNP (last 3 results)  Recent Labs  12/22/16 0259  BNP 169.6*   Basic Metabolic Panel:  Recent Labs Lab 06/11/17 1721 06/12/17 0445  NA 137 138  K 4.3 4.2  CL 104 104  CO2 24 27  GLUCOSE 184* 158*  BUN 25* 22*  CREATININE 1.20 1.07  CALCIUM 9.6 9.5   Liver Function Tests: No results for input(s): AST, ALT, ALKPHOS, BILITOT, PROT, ALBUMIN in the last 168 hours. No results for input(s): LIPASE, AMYLASE in the last 168 hours. No results for input(s): AMMONIA in the last 168 hours. CBC:  Recent Labs Lab 06/11/17 1721 06/12/17 0445  WBC 9.2 5.3  HGB 14.1 14.2  HCT 41.4 41.6  MCV 90.4 91.0  PLT 134* 134*   Cardiac Enzymes: No results for input(s): CKTOTAL, CKMB, CKMBINDEX, TROPONINI in the last 168 hours. BNP: Invalid input(s): POCBNP CBG:  Recent Labs Lab 06/12/17 2127 06/13/17 0657 06/13/17 0811 06/13/17 1142 06/13/17 1617  GLUCAP 204* 151* 161*  252* 187*   D-Dimer No results for input(s): DDIMER in the last 72 hours. Hgb A1c No results for input(s): HGBA1C in the last 72 hours. Lipid Profile No results for input(s): CHOL, HDL, LDLCALC, TRIG, CHOLHDL, LDLDIRECT in the last 72 hours. Thyroid function studies No results  for input(s): TSH, T4TOTAL, T3FREE, THYROIDAB in the last 72 hours.  Invalid input(s): FREET3 Anemia work up No results for input(s): VITAMINB12, FOLATE, FERRITIN, TIBC, IRON, RETICCTPCT in the last 72 hours. Urinalysis    Component Value Date/Time   COLORURINE YELLOW 12/21/2016 1850   APPEARANCEUR CLEAR 12/21/2016 1850   LABSPEC 1.027 12/21/2016 1850   PHURINE 5.0 12/21/2016 1850   GLUCOSEU >=500 (A) 12/21/2016 1850   HGBUR MODERATE (A) 12/21/2016 1850   BILIRUBINUR NEGATIVE 12/21/2016 1850   KETONESUR 5 (A) 12/21/2016 1850   PROTEINUR NEGATIVE 12/21/2016 1850   UROBILINOGEN 0.2 12/12/2014 0150   NITRITE NEGATIVE 12/21/2016 1850   LEUKOCYTESUR NEGATIVE 12/21/2016 1850   Sepsis Labs Invalid input(s): PROCALCITONIN,  WBC,  LACTICIDVEN Microbiology No results found for this or any previous visit (from the past 240 hour(s)).   Time coordinating discharge: Over 30 minutes  SIGNED:   Hosie Poisson, MD  [facility] 06/16/2017, 7:36 AM Pager   If 7PM-7AM, please contact night-coverage [email or web address] Password [email or web address]

## 2017-08-10 IMAGING — CT CT T SPINE W/ CM
2 of 3 series · 9 of 33 positions shown, 11 images · non-contrast
Comparison: [HOSPITAL] neurosurgery lumbar radiographs 12/13/2015. CT
Abdomen and Pelvis 12/12/2014. Lumbar CT myelogram 05/13/2013.

CLINICAL DATA: 74-year-old male with lumbar back pain radiating to
the right lower extremity. Bilateral foot drop. Previous lumbar
spine surgery. Subsequent encounter.
TECHNIQUE: Contiguous axial images were obtained through the Cervical,
Thoracic, and Lumbar spine after the intrathecal infusion of
infusion. Coronal and sagittal reconstructions were obtained of the
axial image sets.

[Series 5: l-spine 2.0 st · axial · 0.30mm/px · z∈[+333,+631]mm · 6 of 194 slices shown, 8 images]
[im 30/194  soft-tissue]
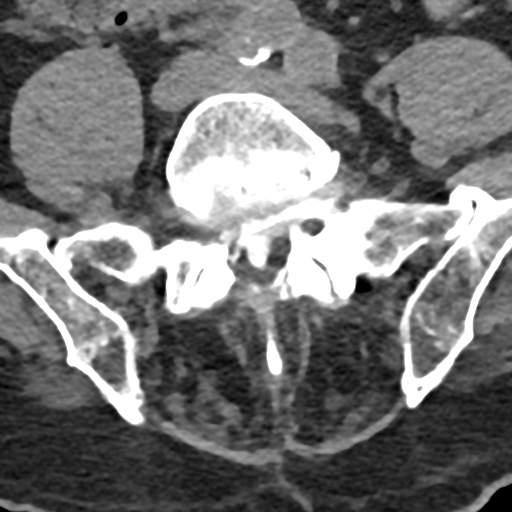
[im 30/194  bone]
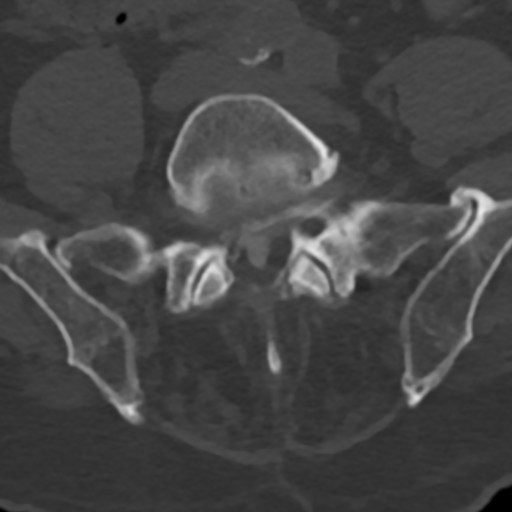
[im 60/194  bone]
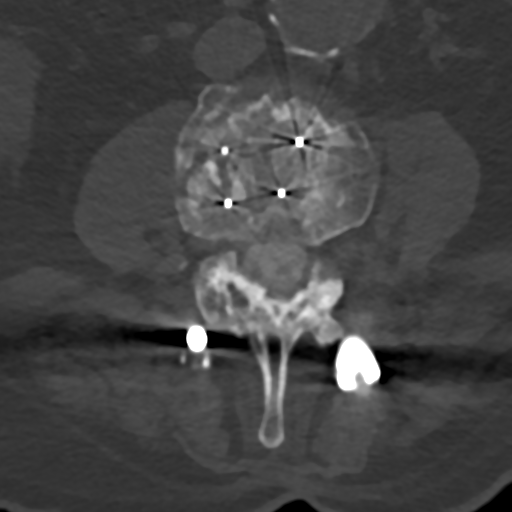
[im 90/194  bone]
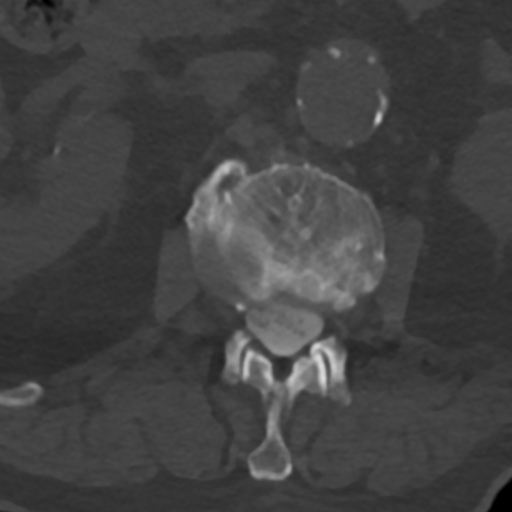
[im 119/194  bone]
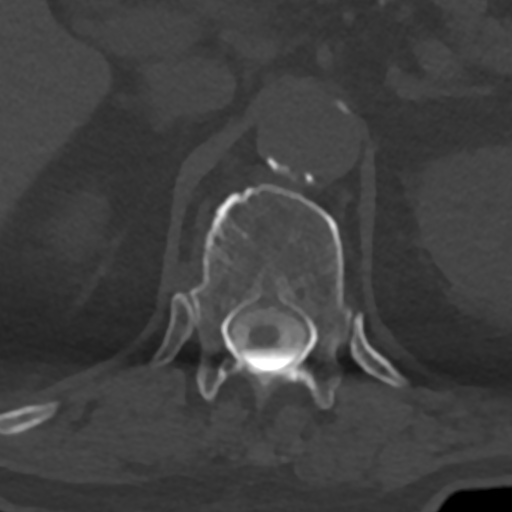
[im 149/194  soft-tissue]
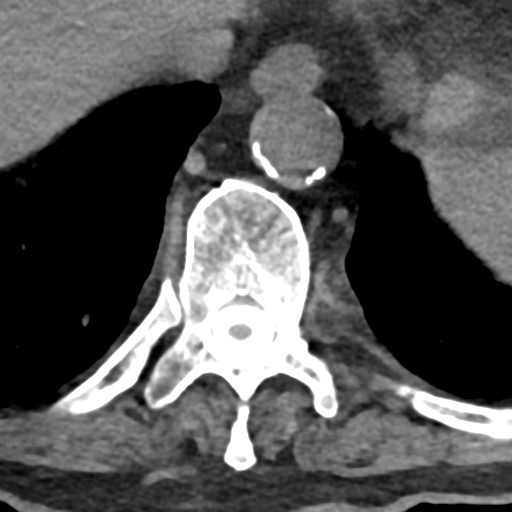
[im 149/194  bone]
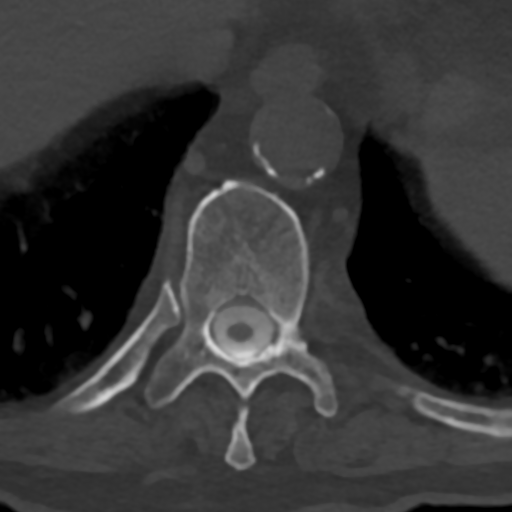
[im 179/194  bone]
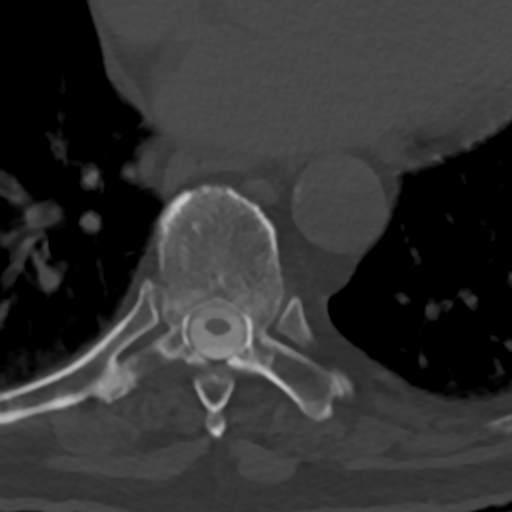

[Series 9: l-spine 2.0 cor bone · coronal · 0.37mm/px · 3 of 67 slices shown]
[im 14/67  bone]
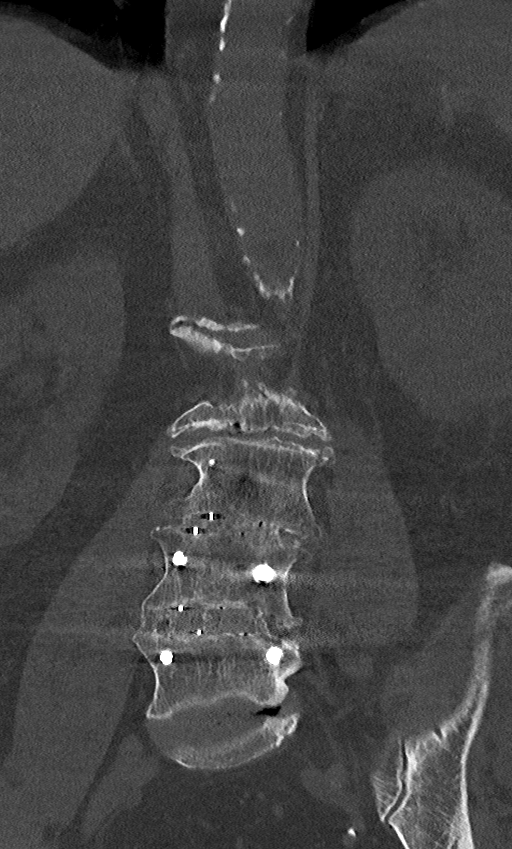
[im 27/67  bone]
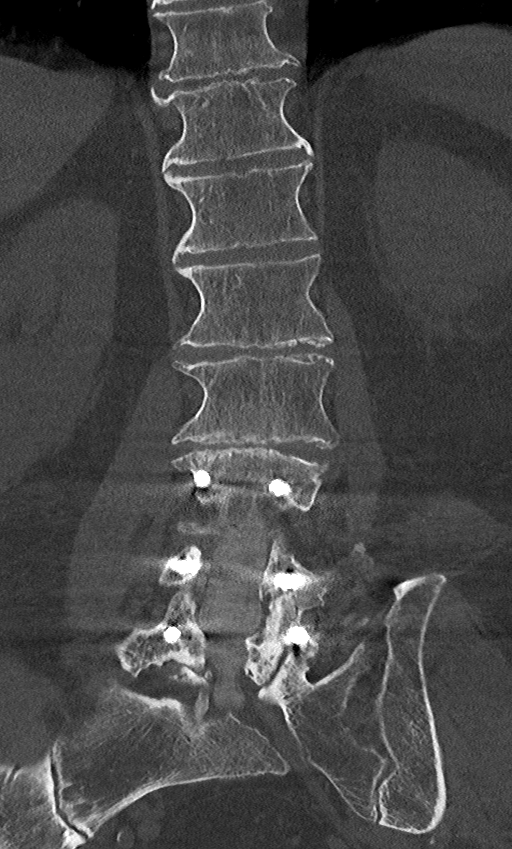
[im 40/67  bone]
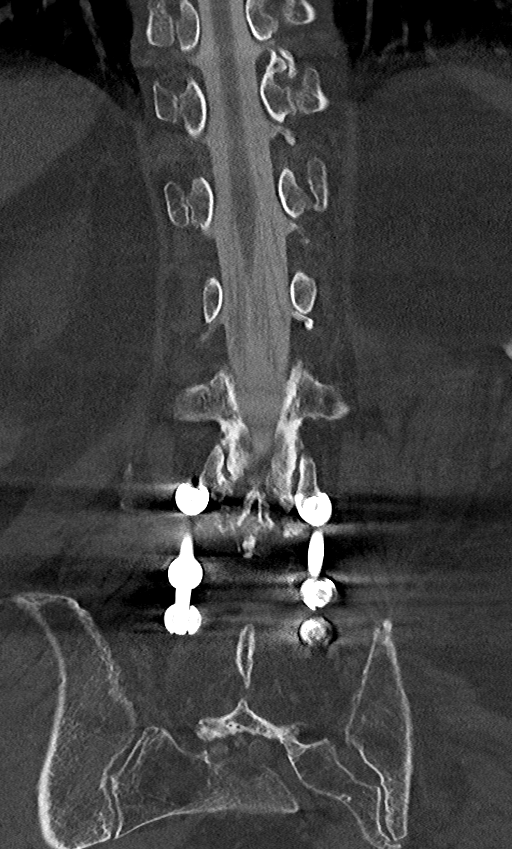

[9 of 33 positions shown; findings below may reference images not displayed]

FLUOROSCOPY TIME:  2 minutes 18 seconds

PROCEDURE:
LUMBAR PUNCTURE FOR CERVICAL LUMBAR AND THORACIC MYELOGRAM

CERVICAL AND LUMBAR AND THORACIC MYELOGRAM

CT CERVICAL MYELOGRAM

CT LUMBAR MYELOGRAM

CT THORACIC MYELOGRAM

Lumbar puncture and intrathecal contrast administration were
performed by Dr. DZIK-BRUK SCHRODER who will separately report for the
portion of the procedure. I personally supervised acquisition of the
myelogram images.
FINDINGS: CERVICAL, THORACIC, AND LUMBAR MYELOGRAM FINDINGS:

Same numbering system as on the 5432 myelogram, designating previous
posterior and interbody fusion at L3-L4 and L4-L5. Normal lumbar
segmentation.

Bilateral transpedicular hardware at L3-L4 and L4-L5 with interbody
implants at each level. On the lateral view vertebral height and
alignment appears stable since 5432. Severe disc space loss and
endplate spurring re- demonstrated at L2-L3.

Lateral images today suggest circumferential mass effect on the
thecal sac at L2-L3. Furthermore, when using gravity the transmit
contrast into the thoracic and cervical spine there seem to be rate
limiting narrowing at the L2-L3 level such that the contrast bolus
would not state to gather. Ultimately, good contrast opacification
of the thoracic spine occurred. Less ideal contrast opacification of
the cervical spine. Lateral views demonstrate no evidence of
thoracic spinal stenosis. See cervical spine findings from the CT
section below.

CT CERVICAL MYELOGRAM FINDINGS:

Preserved cervical lordosis. Mild 1-2 mm anterolisthesis at C3-C4
and C4-C5. Chronic disc and endplate degeneration at C5-C6 and
C6-C7, see details below. Trace anterolisthesis also at the
cervicothoracic junction.

No acute osseous abnormality identified. Visualized paranasal
sinuses and mastoids are clear.

Subarachnoid contrast in the posterior fossa and basilar cisterns.
Negative noncontrast paraspinal soft tissues aside from calcified
carotid bifurcation atherosclerosis. Mild scarring at both lung
apices.

Capacious cervical spinal canal.

C2-C3: Moderate facet hypertrophy greater on the left. Left greater
than right uncovertebral hypertrophy. Moderate left C3 foraminal
stenosis.

C3-C4: Severe facet and uncovertebral hypertrophy on the left.
Severe left C4 foraminal stenosis.

C4-C5: Vacuum disc. Small calcified central disc protrusion.
Moderate facet and uncovertebral hypertrophy on the left. Severe
left C5 foraminal stenosis.

C5-C6: Severe disc space loss. Vacuum disc. Circumferential disc
osteophyte complex. Gas containing posterior inferior C5 endplate
subchondral cyst or less likely Schmorl node. Broad-based posterior
component narrowing the ventral CSF space, but no significant spinal
stenosis. Moderate left greater than right C6 foraminal stenosis.

C6-C7: Severe disc space loss. Vacuum disc. Broad-based posterior
component with no spinal stenosis. Mild facet and moderate
uncovertebral hypertrophy. Moderate bilateral C7 foraminal stenosis.

C7-T1: Mild anterolisthesis. Moderate facet hypertrophy. No
stenosis.

CT THORACIC MYELOGRAM FINDINGS:

Apical pulmonary scarring and mild paraseptal emphysema. Visualized
major airways are patent. Cardiomegaly. Calcified aortic and
coronary artery atherosclerosis. No mediastinal lymphadenopathy.
Negative thoracic inlet, tiny calcified left thyroid nodule which
does not meet consensus criteria for ultrasound follow-up.
Visualized upper abdominal viscera remarkable for surgical absence
of the gallbladder and abdominal aortic calcified atherosclerosis.

Preserved thoracic vertebral height and alignment. Normal thoracic
segmentation. No acute osseous abnormality identified.

Capacious thoracic spinal canal.

T1-T2: Mild facet hypertrophy on the left.  No stenosis.

T2-T3: Mild facet hypertrophy greater on the left.  No stenosis.

T3-T4: Negative aside from anterior disc osteophyte complex.

T4-T5: Negative aside from anterior disc osteophyte complex.

T5-T6: Negative.

T6-T7: Negative.

T7-T8: Negative.

T8-T9: Mild circumferential disc osteophyte complex. Left greater
than right uncovertebral hypertrophy. Mild T8 foraminal stenosis,
greater on the left.

T9-T10: Negative aside from anterior disc osteophyte complex.

T10-T11: Vacuum disc. Mild mostly anterior circumferential disc
osteophyte complex. Moderate to severe facet hypertrophy greater on
the left. No spinal stenosis. Moderate left and mild right T10
foraminal stenosis.

T11-T12: Mild facet hypertrophy, otherwise negative.

CT LUMBAR MYELOGRAM FINDINGS:

Chronic calcified aortic atherosclerosis with fusiform infrarenal
abdominal aortic aneurysm measuring 36-37 mm AP (versus 33 mm in
5432 series 10, image 42). Calcified atherosclerosis and ectasia
continues into the iliac arteries.

Otherwise negative visualized abdominal and pelvic viscera. Stable
postoperative changes to the posterior paraspinal soft tissues.

Normal myelographic appearance of the lower thoracic spinal cord and
conus, tip of the conus at T12-L1.

Stable visualized osseous structures. No acute osseous abnormality
identified.

T12-L1:  Mild vacuum disc.  Mild facet hypertrophy.  No stenosis.

L1-L2: Severe disc space loss with mild vacuum disc. Mostly anterior
eccentric circumferential disc osteophyte complex. No stenosis.

L2-L3: Severe disc space loss with bulky right eccentric
circumferential disc osteophyte complex. Mild to moderate facet
hypertrophy greater on the right. However, there is no significant
spinal stenosis. There is at least moderate right lateral recess
stenosis which appears mildly progressed since 5432 (series 5, image
121). Mild right L2 foraminal stenosis primarily due to osseous
endplate and facet hypertrophy.

L3-L4: Sequelae of decompression and fusion. Stable L3
transpedicular hardware, including medial course of the left screw.
No evidence of screw loosening. Progressed and solid-appearing
interbody arthrodesis (coronal image 20, series 9. Mild osseous left
L3 foraminal stenosis.)

L4-L5: Sequelae of decompression and fusion. Stable transpedicular
screws without evidence of loosening. Progressed and solid-appearing
interbody arthrodesis (coronal image 15). Mild left osseous
foraminal stenosis.

L5-S1: Severe facet hypertrophy appears mildly progressed since 5432
and greater on the left with some vacuum facet phenomena, and
multiple subchondral cysts. Epidural lipomatosis. Subsequent lateral
mass effect on the thecal sac has increased since 5432 compatible
with mild overall spinal stenosis. Chronic right eccentric
circumferential disc osteophyte complex with stable vacuum disc.
There is bilateral lateral recess stenosis which may be more
pronounced on the right (series 5, images 163-165) at the level of
the descending S1 nerve roots. This is due to combined facet and
disc osteophyte disease, and is chronic but progressed. There is
mild to moderate left L5 foraminal stenosis primarily due to facet
and endplate spurring (sagittal image 38), but stable. No right L5
foraminal stenosis. Bilateral S1 foramina are normal.
IMPRESSION: 1. Capacious cervical spinal canal with no cervical spinal stenosis
despite chronic disc and endplate degeneration. Upper cervical
moderate to severe facet arthropathy resulting in moderate to severe
foraminal stenosis at the left C3, left C4, and left C5 nerve
levels. Moderate bilateral C6 and C7 foraminal stenosis primarily
due to endplate spurring.
2. Capacious thoracic spinal canal with no thoracic spinal stenosis
despite chronic disc, endplate, and posterior element degeneration.
There is mild to moderate lower thoracic neural foraminal stenosis,
maximal at the left T10 nerve level.
3. Sequelae of decompression and fusion at L3-L4 and L4-L5 with
progressed since solid-appearing interbody arthrodesis at both
levels. Stable fusion hardware.
4. Adjacent segment disease at L2-L3, but no significant spinal
stenosis. There is at least moderate multifactorial right lateral
recess stenosis in part due to disc osteophyte complex which appears
mildly progressed since 5432. Query right L3 radiculitis.
5. Adjacent segment disease at L5-S1 with stable disc osteophyte
complex but mildly progressed severe facet arthropathy. Increased
mild spinal stenosis and chronic but increased bilateral moderate to
severe lateral recess stenosis which appears greater on the right.
Query bilateral S1 radiculitis. Stable moderate left L5 foraminal
stenosis.
6. Chronic infrarenal abdominal aortic aneurysm has mildly
progressed since 5432. Osseous moderate left L5 foraminal stenosis
Recommend followup by aorta ultrasound in 2 years. This
recommendation follows ACR consensus guidelines: White Paper of the
ACR Incidental Findings Committee II on Vascular Findings. [HOSPITAL] 0847; [DATE].

## 2017-08-10 IMAGING — RF DG MYELOGRAM 2+ REGIONS
9 series · 9 of 9 positions shown · non-contrast
Comparison: [HOSPITAL] neurosurgery lumbar radiographs 12/13/2015. CT
Abdomen and Pelvis 12/12/2014. Lumbar CT myelogram 05/13/2013.

CLINICAL DATA: 74-year-old male with lumbar back pain radiating to
the right lower extremity. Bilateral foot drop. Previous lumbar
spine surgery. Subsequent encounter.
TECHNIQUE: Contiguous axial images were obtained through the Cervical,
Thoracic, and Lumbar spine after the intrathecal infusion of
infusion. Coronal and sagittal reconstructions were obtained of the
axial image sets.

[Series 1: cp_standard · 0.25mm/px · 1 of 1 slices shown]
[im 1/1]
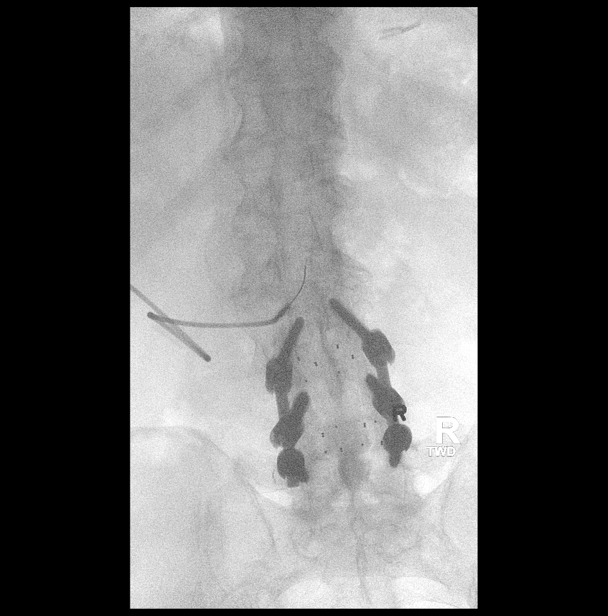

[Series 2: fluoro_myelogram_singleshot_bw · 0.19mm/px · 1 of 1 slices shown (1 of 8)]
[im 1/1]
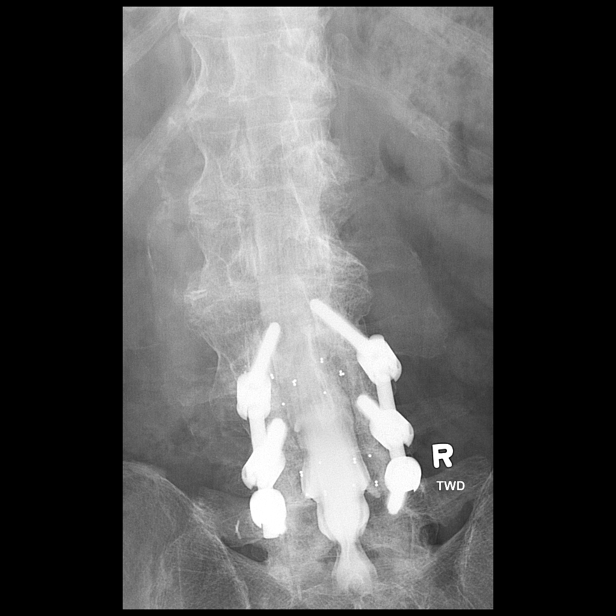

[Series 3: fluoro_myelogram_singleshot_bw · 0.19mm/px · 1 of 1 slices shown (2 of 8)]
[im 1/1]
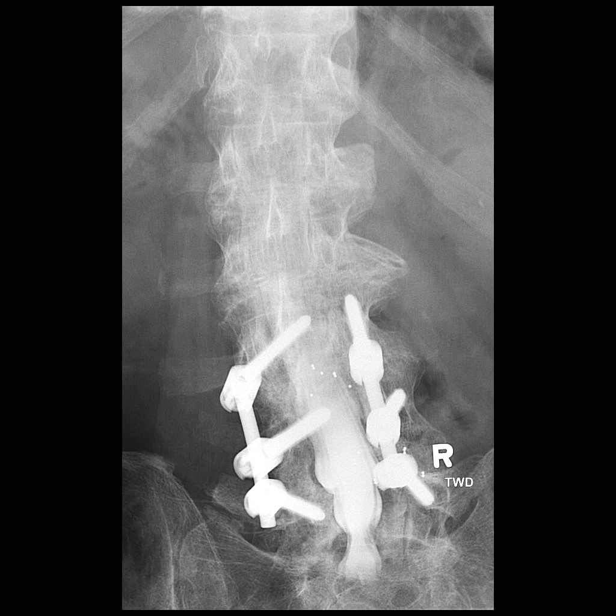

[Series 4: fluoro_myelogram_singleshot_bw · 0.19mm/px · 1 of 1 slices shown (3 of 8)]
[im 1/1]
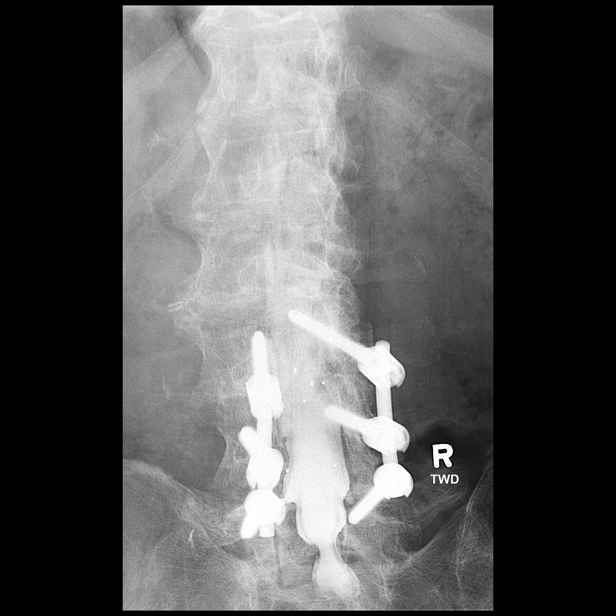

[Series 5: fluoro_myelogram_singleshot_bw · 0.19mm/px · 1 of 1 slices shown (4 of 8)]
[im 1/1]
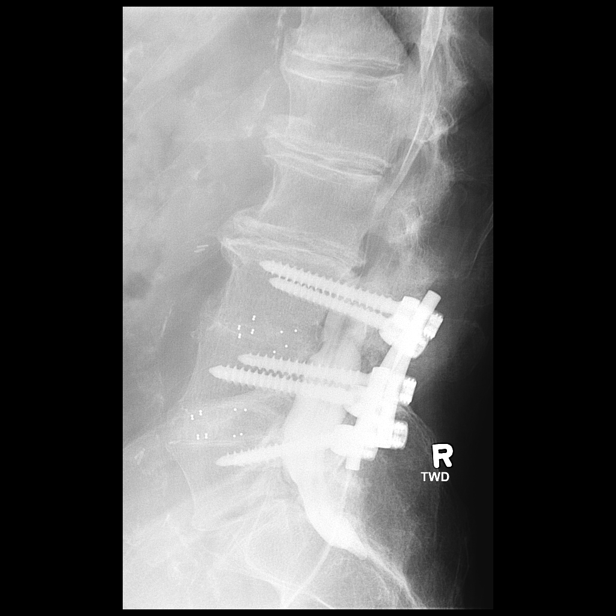

[Series 6: fluoro_myelogram_singleshot_bw · 0.19mm/px · 1 of 1 slices shown (5 of 8)]
[im 1/1]
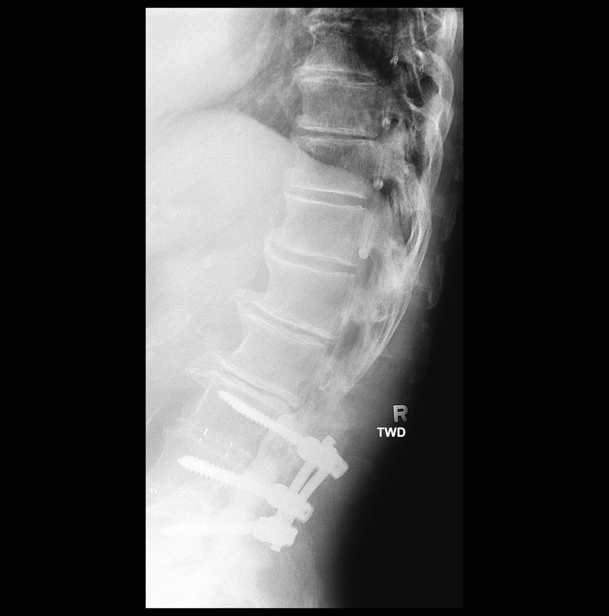

[Series 7: fluoro_myelogram_singleshot_bw · 0.19mm/px · 1 of 1 slices shown (6 of 8)]
[im 1/1]
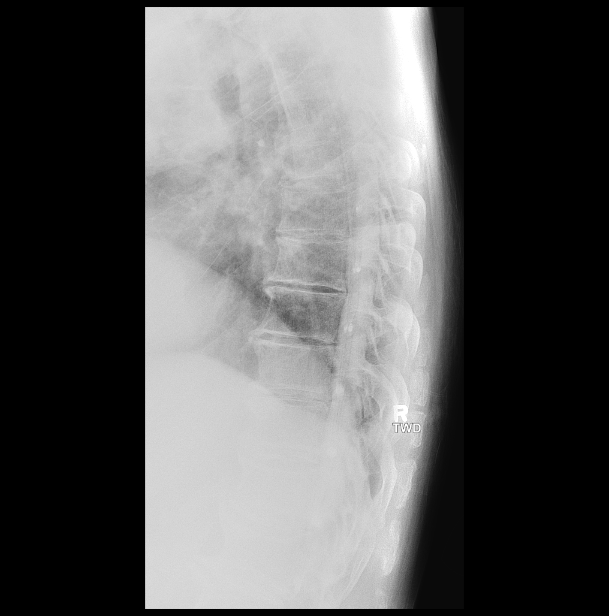

[Series 8: fluoro_myelogram_singleshot_bw · 0.19mm/px · 1 of 1 slices shown (7 of 8)]
[im 1/1]
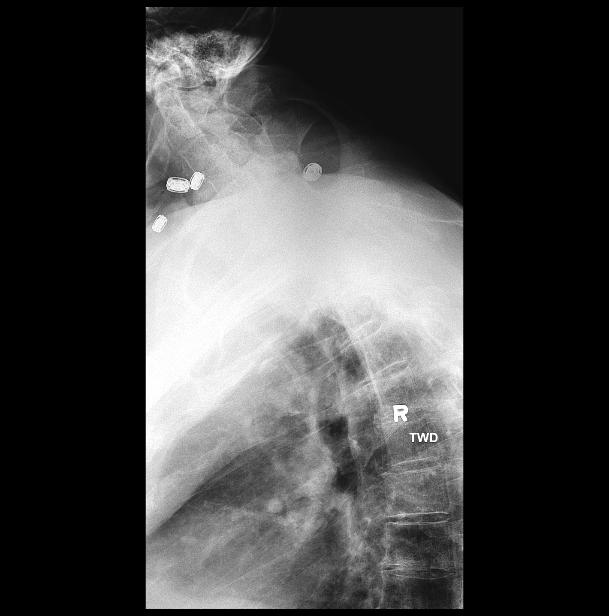

[Series 9: fluoro_myelogram_singleshot_bw · 0.18mm/px · 1 of 1 slices shown (8 of 8)]
[im 1/1]
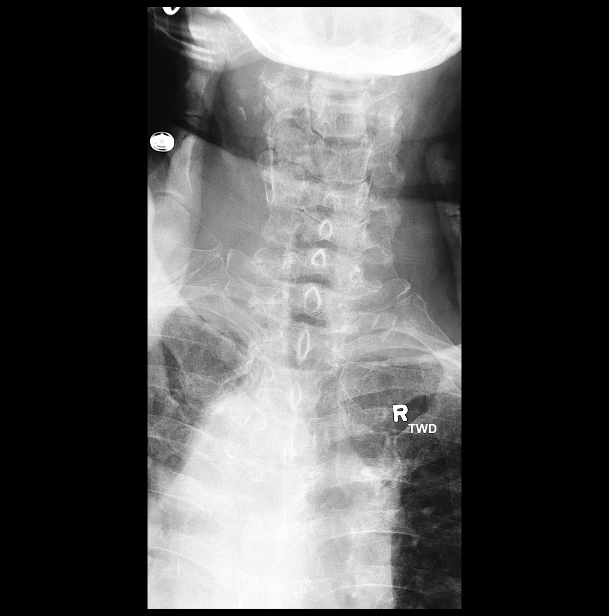

[9 of 9 positions shown; findings below may reference images not displayed]

FLUOROSCOPY TIME:  2 minutes 18 seconds

PROCEDURE:
LUMBAR PUNCTURE FOR CERVICAL LUMBAR AND THORACIC MYELOGRAM

CERVICAL AND LUMBAR AND THORACIC MYELOGRAM

CT CERVICAL MYELOGRAM

CT LUMBAR MYELOGRAM

CT THORACIC MYELOGRAM

Lumbar puncture and intrathecal contrast administration were
performed by Dr. DZIK-BRUK SCHRODER who will separately report for the
portion of the procedure. I personally supervised acquisition of the
myelogram images.
FINDINGS: CERVICAL, THORACIC, AND LUMBAR MYELOGRAM FINDINGS:

Same numbering system as on the 5432 myelogram, designating previous
posterior and interbody fusion at L3-L4 and L4-L5. Normal lumbar
segmentation.

Bilateral transpedicular hardware at L3-L4 and L4-L5 with interbody
implants at each level. On the lateral view vertebral height and
alignment appears stable since 5432. Severe disc space loss and
endplate spurring re- demonstrated at L2-L3.

Lateral images today suggest circumferential mass effect on the
thecal sac at L2-L3. Furthermore, when using gravity the transmit
contrast into the thoracic and cervical spine there seem to be rate
limiting narrowing at the L2-L3 level such that the contrast bolus
would not state to gather. Ultimately, good contrast opacification
of the thoracic spine occurred. Less ideal contrast opacification of
the cervical spine. Lateral views demonstrate no evidence of
thoracic spinal stenosis. See cervical spine findings from the CT
section below.

CT CERVICAL MYELOGRAM FINDINGS:

Preserved cervical lordosis. Mild 1-2 mm anterolisthesis at C3-C4
and C4-C5. Chronic disc and endplate degeneration at C5-C6 and
C6-C7, see details below. Trace anterolisthesis also at the
cervicothoracic junction.

No acute osseous abnormality identified. Visualized paranasal
sinuses and mastoids are clear.

Subarachnoid contrast in the posterior fossa and basilar cisterns.
Negative noncontrast paraspinal soft tissues aside from calcified
carotid bifurcation atherosclerosis. Mild scarring at both lung
apices.

Capacious cervical spinal canal.

C2-C3: Moderate facet hypertrophy greater on the left. Left greater
than right uncovertebral hypertrophy. Moderate left C3 foraminal
stenosis.

C3-C4: Severe facet and uncovertebral hypertrophy on the left.
Severe left C4 foraminal stenosis.

C4-C5: Vacuum disc. Small calcified central disc protrusion.
Moderate facet and uncovertebral hypertrophy on the left. Severe
left C5 foraminal stenosis.

C5-C6: Severe disc space loss. Vacuum disc. Circumferential disc
osteophyte complex. Gas containing posterior inferior C5 endplate
subchondral cyst or less likely Schmorl node. Broad-based posterior
component narrowing the ventral CSF space, but no significant spinal
stenosis. Moderate left greater than right C6 foraminal stenosis.

C6-C7: Severe disc space loss. Vacuum disc. Broad-based posterior
component with no spinal stenosis. Mild facet and moderate
uncovertebral hypertrophy. Moderate bilateral C7 foraminal stenosis.

C7-T1: Mild anterolisthesis. Moderate facet hypertrophy. No
stenosis.

CT THORACIC MYELOGRAM FINDINGS:

Apical pulmonary scarring and mild paraseptal emphysema. Visualized
major airways are patent. Cardiomegaly. Calcified aortic and
coronary artery atherosclerosis. No mediastinal lymphadenopathy.
Negative thoracic inlet, tiny calcified left thyroid nodule which
does not meet consensus criteria for ultrasound follow-up.
Visualized upper abdominal viscera remarkable for surgical absence
of the gallbladder and abdominal aortic calcified atherosclerosis.

Preserved thoracic vertebral height and alignment. Normal thoracic
segmentation. No acute osseous abnormality identified.

Capacious thoracic spinal canal.

T1-T2: Mild facet hypertrophy on the left.  No stenosis.

T2-T3: Mild facet hypertrophy greater on the left.  No stenosis.

T3-T4: Negative aside from anterior disc osteophyte complex.

T4-T5: Negative aside from anterior disc osteophyte complex.

T5-T6: Negative.

T6-T7: Negative.

T7-T8: Negative.

T8-T9: Mild circumferential disc osteophyte complex. Left greater
than right uncovertebral hypertrophy. Mild T8 foraminal stenosis,
greater on the left.

T9-T10: Negative aside from anterior disc osteophyte complex.

T10-T11: Vacuum disc. Mild mostly anterior circumferential disc
osteophyte complex. Moderate to severe facet hypertrophy greater on
the left. No spinal stenosis. Moderate left and mild right T10
foraminal stenosis.

T11-T12: Mild facet hypertrophy, otherwise negative.

CT LUMBAR MYELOGRAM FINDINGS:

Chronic calcified aortic atherosclerosis with fusiform infrarenal
abdominal aortic aneurysm measuring 36-37 mm AP (versus 33 mm in
5432 series 10, image 42). Calcified atherosclerosis and ectasia
continues into the iliac arteries.

Otherwise negative visualized abdominal and pelvic viscera. Stable
postoperative changes to the posterior paraspinal soft tissues.

Normal myelographic appearance of the lower thoracic spinal cord and
conus, tip of the conus at T12-L1.

Stable visualized osseous structures. No acute osseous abnormality
identified.

T12-L1:  Mild vacuum disc.  Mild facet hypertrophy.  No stenosis.

L1-L2: Severe disc space loss with mild vacuum disc. Mostly anterior
eccentric circumferential disc osteophyte complex. No stenosis.

L2-L3: Severe disc space loss with bulky right eccentric
circumferential disc osteophyte complex. Mild to moderate facet
hypertrophy greater on the right. However, there is no significant
spinal stenosis. There is at least moderate right lateral recess
stenosis which appears mildly progressed since 5432 (series 5, image
121). Mild right L2 foraminal stenosis primarily due to osseous
endplate and facet hypertrophy.

L3-L4: Sequelae of decompression and fusion. Stable L3
transpedicular hardware, including medial course of the left screw.
No evidence of screw loosening. Progressed and solid-appearing
interbody arthrodesis (coronal image 20, series 9. Mild osseous left
L3 foraminal stenosis.)

L4-L5: Sequelae of decompression and fusion. Stable transpedicular
screws without evidence of loosening. Progressed and solid-appearing
interbody arthrodesis (coronal image 15). Mild left osseous
foraminal stenosis.

L5-S1: Severe facet hypertrophy appears mildly progressed since 5432
and greater on the left with some vacuum facet phenomena, and
multiple subchondral cysts. Epidural lipomatosis. Subsequent lateral
mass effect on the thecal sac has increased since 5432 compatible
with mild overall spinal stenosis. Chronic right eccentric
circumferential disc osteophyte complex with stable vacuum disc.
There is bilateral lateral recess stenosis which may be more
pronounced on the right (series 5, images 163-165) at the level of
the descending S1 nerve roots. This is due to combined facet and
disc osteophyte disease, and is chronic but progressed. There is
mild to moderate left L5 foraminal stenosis primarily due to facet
and endplate spurring (sagittal image 38), but stable. No right L5
foraminal stenosis. Bilateral S1 foramina are normal.
IMPRESSION: 1. Capacious cervical spinal canal with no cervical spinal stenosis
despite chronic disc and endplate degeneration. Upper cervical
moderate to severe facet arthropathy resulting in moderate to severe
foraminal stenosis at the left C3, left C4, and left C5 nerve
levels. Moderate bilateral C6 and C7 foraminal stenosis primarily
due to endplate spurring.
2. Capacious thoracic spinal canal with no thoracic spinal stenosis
despite chronic disc, endplate, and posterior element degeneration.
There is mild to moderate lower thoracic neural foraminal stenosis,
maximal at the left T10 nerve level.
3. Sequelae of decompression and fusion at L3-L4 and L4-L5 with
progressed since solid-appearing interbody arthrodesis at both
levels. Stable fusion hardware.
4. Adjacent segment disease at L2-L3, but no significant spinal
stenosis. There is at least moderate multifactorial right lateral
recess stenosis in part due to disc osteophyte complex which appears
mildly progressed since 5432. Query right L3 radiculitis.
5. Adjacent segment disease at L5-S1 with stable disc osteophyte
complex but mildly progressed severe facet arthropathy. Increased
mild spinal stenosis and chronic but increased bilateral moderate to
severe lateral recess stenosis which appears greater on the right.
Query bilateral S1 radiculitis. Stable moderate left L5 foraminal
stenosis.
6. Chronic infrarenal abdominal aortic aneurysm has mildly
progressed since 5432. Osseous moderate left L5 foraminal stenosis
Recommend followup by aorta ultrasound in 2 years. This
recommendation follows ACR consensus guidelines: White Paper of the
ACR Incidental Findings Committee II on Vascular Findings. [HOSPITAL] 0847; [DATE].

## 2017-08-10 IMAGING — CT CT T SPINE W/ CM
3 of 5 series · 8 of 33 positions shown, 9 images · non-contrast
Comparison: [HOSPITAL] neurosurgery lumbar radiographs 12/13/2015. CT
Abdomen and Pelvis 12/12/2014. Lumbar CT myelogram 05/13/2013.

CLINICAL DATA: 74-year-old male with lumbar back pain radiating to
the right lower extremity. Bilateral foot drop. Previous lumbar
spine surgery. Subsequent encounter.
TECHNIQUE: Contiguous axial images were obtained through the Cervical,
Thoracic, and Lumbar spine after the intrathecal infusion of
infusion. Coronal and sagittal reconstructions were obtained of the
axial image sets.

[Series 4: t-spine 2.0 st · axial · 0.30mm/px · z∈[+608,+736]mm · 2 of 194 slices shown (1 of 2)]
[im 65/194  bone]
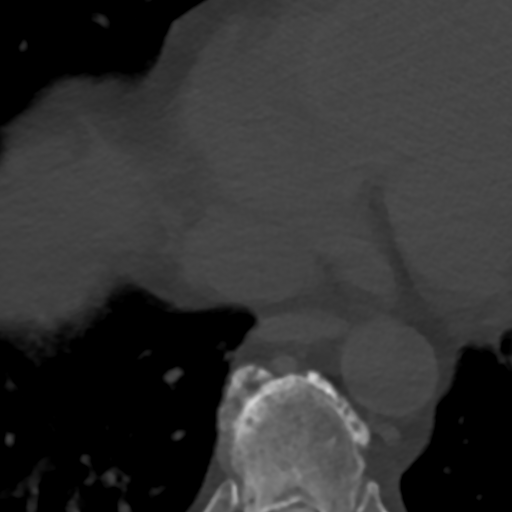
[im 129/194  bone]
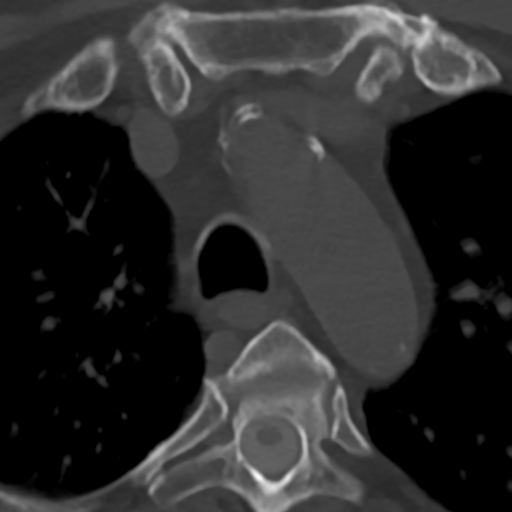

[Series 8: t-spine 2.0 cor bone · coronal · 0.38mm/px · 3 of 82 slices shown]
[im 17/82  bone]
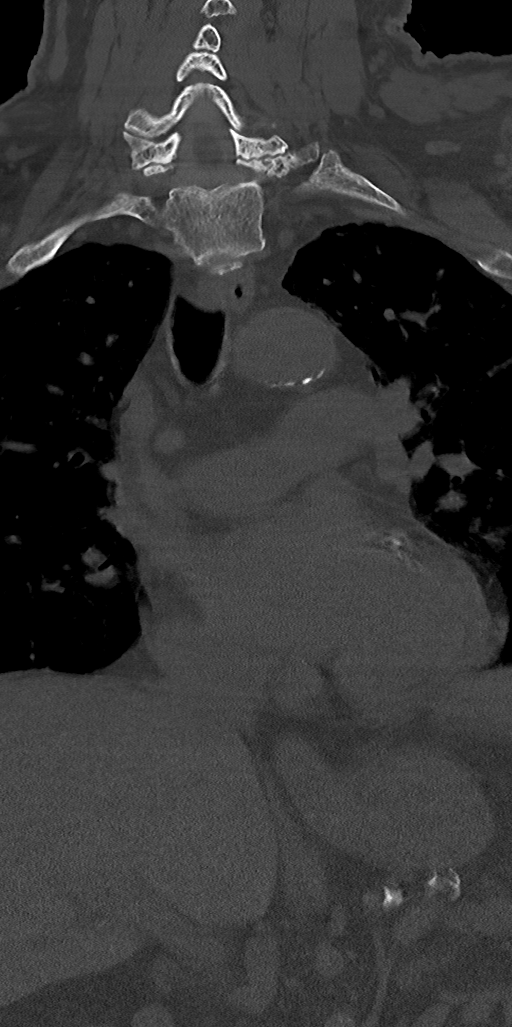
[im 33/82  bone]
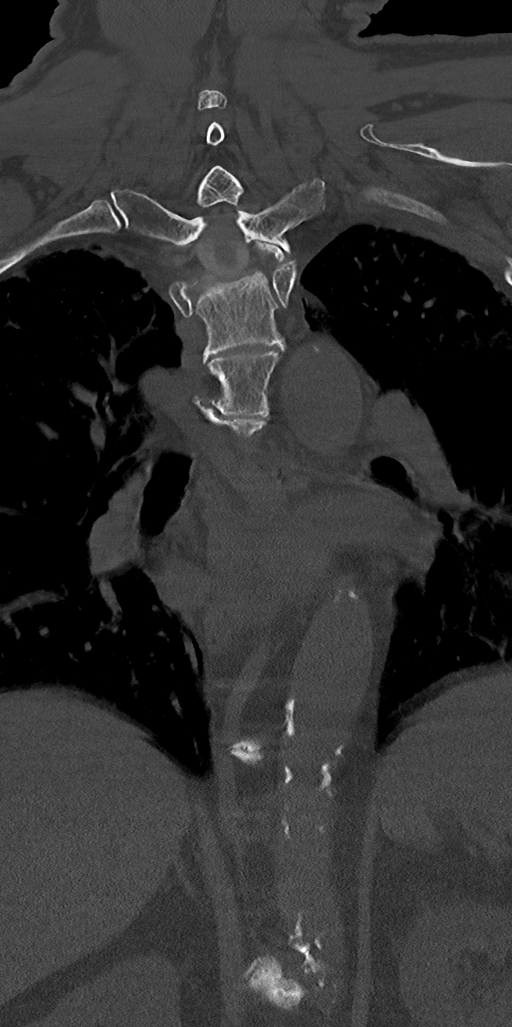
[im 49/82  bone]
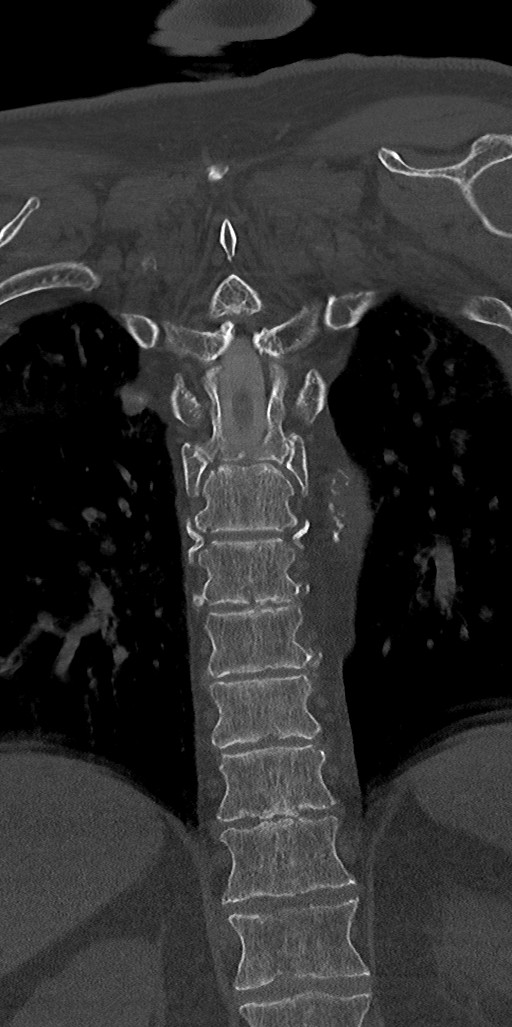

[Series 10: t-spine 2.0 st · axial · 0.45mm/px · z∈[+576,+768]mm · 3 of 194 slices shown, 4 images (2 of 2)]
[im 49/194  soft-tissue]
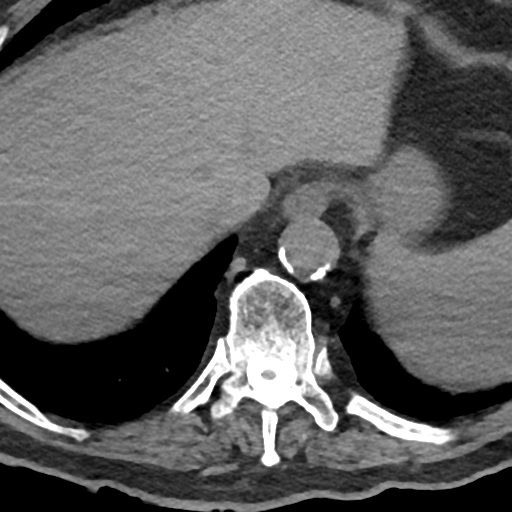
[im 49/194  bone]
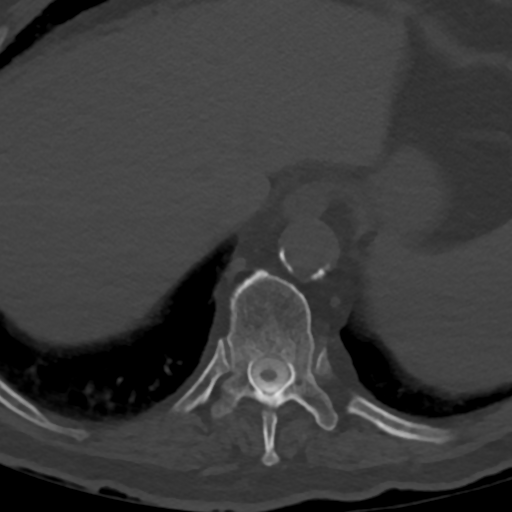
[im 97/194  bone]
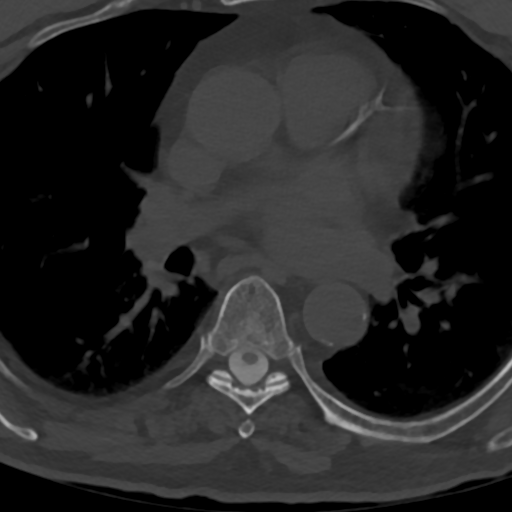
[im 145/194  bone]
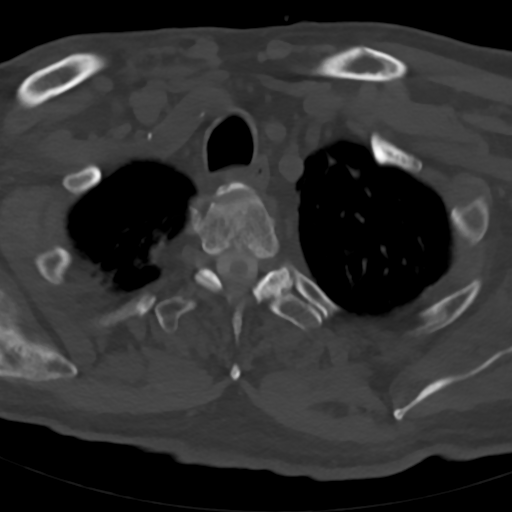

[8 of 33 positions shown; findings below may reference images not displayed]

FLUOROSCOPY TIME:  2 minutes 18 seconds

PROCEDURE:
LUMBAR PUNCTURE FOR CERVICAL LUMBAR AND THORACIC MYELOGRAM

CERVICAL AND LUMBAR AND THORACIC MYELOGRAM

CT CERVICAL MYELOGRAM

CT LUMBAR MYELOGRAM

CT THORACIC MYELOGRAM

Lumbar puncture and intrathecal contrast administration were
performed by Dr. DZIK-BRUK SCHRODER who will separately report for the
portion of the procedure. I personally supervised acquisition of the
myelogram images.
FINDINGS: CERVICAL, THORACIC, AND LUMBAR MYELOGRAM FINDINGS:

Same numbering system as on the 5432 myelogram, designating previous
posterior and interbody fusion at L3-L4 and L4-L5. Normal lumbar
segmentation.

Bilateral transpedicular hardware at L3-L4 and L4-L5 with interbody
implants at each level. On the lateral view vertebral height and
alignment appears stable since 5432. Severe disc space loss and
endplate spurring re- demonstrated at L2-L3.

Lateral images today suggest circumferential mass effect on the
thecal sac at L2-L3. Furthermore, when using gravity the transmit
contrast into the thoracic and cervical spine there seem to be rate
limiting narrowing at the L2-L3 level such that the contrast bolus
would not state to gather. Ultimately, good contrast opacification
of the thoracic spine occurred. Less ideal contrast opacification of
the cervical spine. Lateral views demonstrate no evidence of
thoracic spinal stenosis. See cervical spine findings from the CT
section below.

CT CERVICAL MYELOGRAM FINDINGS:

Preserved cervical lordosis. Mild 1-2 mm anterolisthesis at C3-C4
and C4-C5. Chronic disc and endplate degeneration at C5-C6 and
C6-C7, see details below. Trace anterolisthesis also at the
cervicothoracic junction.

No acute osseous abnormality identified. Visualized paranasal
sinuses and mastoids are clear.

Subarachnoid contrast in the posterior fossa and basilar cisterns.
Negative noncontrast paraspinal soft tissues aside from calcified
carotid bifurcation atherosclerosis. Mild scarring at both lung
apices.

Capacious cervical spinal canal.

C2-C3: Moderate facet hypertrophy greater on the left. Left greater
than right uncovertebral hypertrophy. Moderate left C3 foraminal
stenosis.

C3-C4: Severe facet and uncovertebral hypertrophy on the left.
Severe left C4 foraminal stenosis.

C4-C5: Vacuum disc. Small calcified central disc protrusion.
Moderate facet and uncovertebral hypertrophy on the left. Severe
left C5 foraminal stenosis.

C5-C6: Severe disc space loss. Vacuum disc. Circumferential disc
osteophyte complex. Gas containing posterior inferior C5 endplate
subchondral cyst or less likely Schmorl node. Broad-based posterior
component narrowing the ventral CSF space, but no significant spinal
stenosis. Moderate left greater than right C6 foraminal stenosis.

C6-C7: Severe disc space loss. Vacuum disc. Broad-based posterior
component with no spinal stenosis. Mild facet and moderate
uncovertebral hypertrophy. Moderate bilateral C7 foraminal stenosis.

C7-T1: Mild anterolisthesis. Moderate facet hypertrophy. No
stenosis.

CT THORACIC MYELOGRAM FINDINGS:

Apical pulmonary scarring and mild paraseptal emphysema. Visualized
major airways are patent. Cardiomegaly. Calcified aortic and
coronary artery atherosclerosis. No mediastinal lymphadenopathy.
Negative thoracic inlet, tiny calcified left thyroid nodule which
does not meet consensus criteria for ultrasound follow-up.
Visualized upper abdominal viscera remarkable for surgical absence
of the gallbladder and abdominal aortic calcified atherosclerosis.

Preserved thoracic vertebral height and alignment. Normal thoracic
segmentation. No acute osseous abnormality identified.

Capacious thoracic spinal canal.

T1-T2: Mild facet hypertrophy on the left.  No stenosis.

T2-T3: Mild facet hypertrophy greater on the left.  No stenosis.

T3-T4: Negative aside from anterior disc osteophyte complex.

T4-T5: Negative aside from anterior disc osteophyte complex.

T5-T6: Negative.

T6-T7: Negative.

T7-T8: Negative.

T8-T9: Mild circumferential disc osteophyte complex. Left greater
than right uncovertebral hypertrophy. Mild T8 foraminal stenosis,
greater on the left.

T9-T10: Negative aside from anterior disc osteophyte complex.

T10-T11: Vacuum disc. Mild mostly anterior circumferential disc
osteophyte complex. Moderate to severe facet hypertrophy greater on
the left. No spinal stenosis. Moderate left and mild right T10
foraminal stenosis.

T11-T12: Mild facet hypertrophy, otherwise negative.

CT LUMBAR MYELOGRAM FINDINGS:

Chronic calcified aortic atherosclerosis with fusiform infrarenal
abdominal aortic aneurysm measuring 36-37 mm AP (versus 33 mm in
5432 series 10, image 42). Calcified atherosclerosis and ectasia
continues into the iliac arteries.

Otherwise negative visualized abdominal and pelvic viscera. Stable
postoperative changes to the posterior paraspinal soft tissues.

Normal myelographic appearance of the lower thoracic spinal cord and
conus, tip of the conus at T12-L1.

Stable visualized osseous structures. No acute osseous abnormality
identified.

T12-L1:  Mild vacuum disc.  Mild facet hypertrophy.  No stenosis.

L1-L2: Severe disc space loss with mild vacuum disc. Mostly anterior
eccentric circumferential disc osteophyte complex. No stenosis.

L2-L3: Severe disc space loss with bulky right eccentric
circumferential disc osteophyte complex. Mild to moderate facet
hypertrophy greater on the right. However, there is no significant
spinal stenosis. There is at least moderate right lateral recess
stenosis which appears mildly progressed since 5432 (series 5, image
121). Mild right L2 foraminal stenosis primarily due to osseous
endplate and facet hypertrophy.

L3-L4: Sequelae of decompression and fusion. Stable L3
transpedicular hardware, including medial course of the left screw.
No evidence of screw loosening. Progressed and solid-appearing
interbody arthrodesis (coronal image 20, series 9. Mild osseous left
L3 foraminal stenosis.)

L4-L5: Sequelae of decompression and fusion. Stable transpedicular
screws without evidence of loosening. Progressed and solid-appearing
interbody arthrodesis (coronal image 15). Mild left osseous
foraminal stenosis.

L5-S1: Severe facet hypertrophy appears mildly progressed since 5432
and greater on the left with some vacuum facet phenomena, and
multiple subchondral cysts. Epidural lipomatosis. Subsequent lateral
mass effect on the thecal sac has increased since 5432 compatible
with mild overall spinal stenosis. Chronic right eccentric
circumferential disc osteophyte complex with stable vacuum disc.
There is bilateral lateral recess stenosis which may be more
pronounced on the right (series 5, images 163-165) at the level of
the descending S1 nerve roots. This is due to combined facet and
disc osteophyte disease, and is chronic but progressed. There is
mild to moderate left L5 foraminal stenosis primarily due to facet
and endplate spurring (sagittal image 38), but stable. No right L5
foraminal stenosis. Bilateral S1 foramina are normal.
IMPRESSION: 1. Capacious cervical spinal canal with no cervical spinal stenosis
despite chronic disc and endplate degeneration. Upper cervical
moderate to severe facet arthropathy resulting in moderate to severe
foraminal stenosis at the left C3, left C4, and left C5 nerve
levels. Moderate bilateral C6 and C7 foraminal stenosis primarily
due to endplate spurring.
2. Capacious thoracic spinal canal with no thoracic spinal stenosis
despite chronic disc, endplate, and posterior element degeneration.
There is mild to moderate lower thoracic neural foraminal stenosis,
maximal at the left T10 nerve level.
3. Sequelae of decompression and fusion at L3-L4 and L4-L5 with
progressed since solid-appearing interbody arthrodesis at both
levels. Stable fusion hardware.
4. Adjacent segment disease at L2-L3, but no significant spinal
stenosis. There is at least moderate multifactorial right lateral
recess stenosis in part due to disc osteophyte complex which appears
mildly progressed since 5432. Query right L3 radiculitis.
5. Adjacent segment disease at L5-S1 with stable disc osteophyte
complex but mildly progressed severe facet arthropathy. Increased
mild spinal stenosis and chronic but increased bilateral moderate to
severe lateral recess stenosis which appears greater on the right.
Query bilateral S1 radiculitis. Stable moderate left L5 foraminal
stenosis.
6. Chronic infrarenal abdominal aortic aneurysm has mildly
progressed since 5432. Osseous moderate left L5 foraminal stenosis
Recommend followup by aorta ultrasound in 2 years. This
recommendation follows ACR consensus guidelines: White Paper of the
ACR Incidental Findings Committee II on Vascular Findings. [HOSPITAL] 0847; [DATE].

## 2017-08-13 ENCOUNTER — Encounter: Payer: Self-pay | Admitting: Interventional Cardiology

## 2017-08-22 IMAGING — CR DG LUMBAR SPINE 2-3V
1 series · 1 of 1 positions shown · non-contrast
Comparison: CT myelogram 01/04/2016

CLINICAL DATA: Right laminectomy L5-S1

EXAM:
LUMBAR SPINE - 2-3 VIEW

[lat]
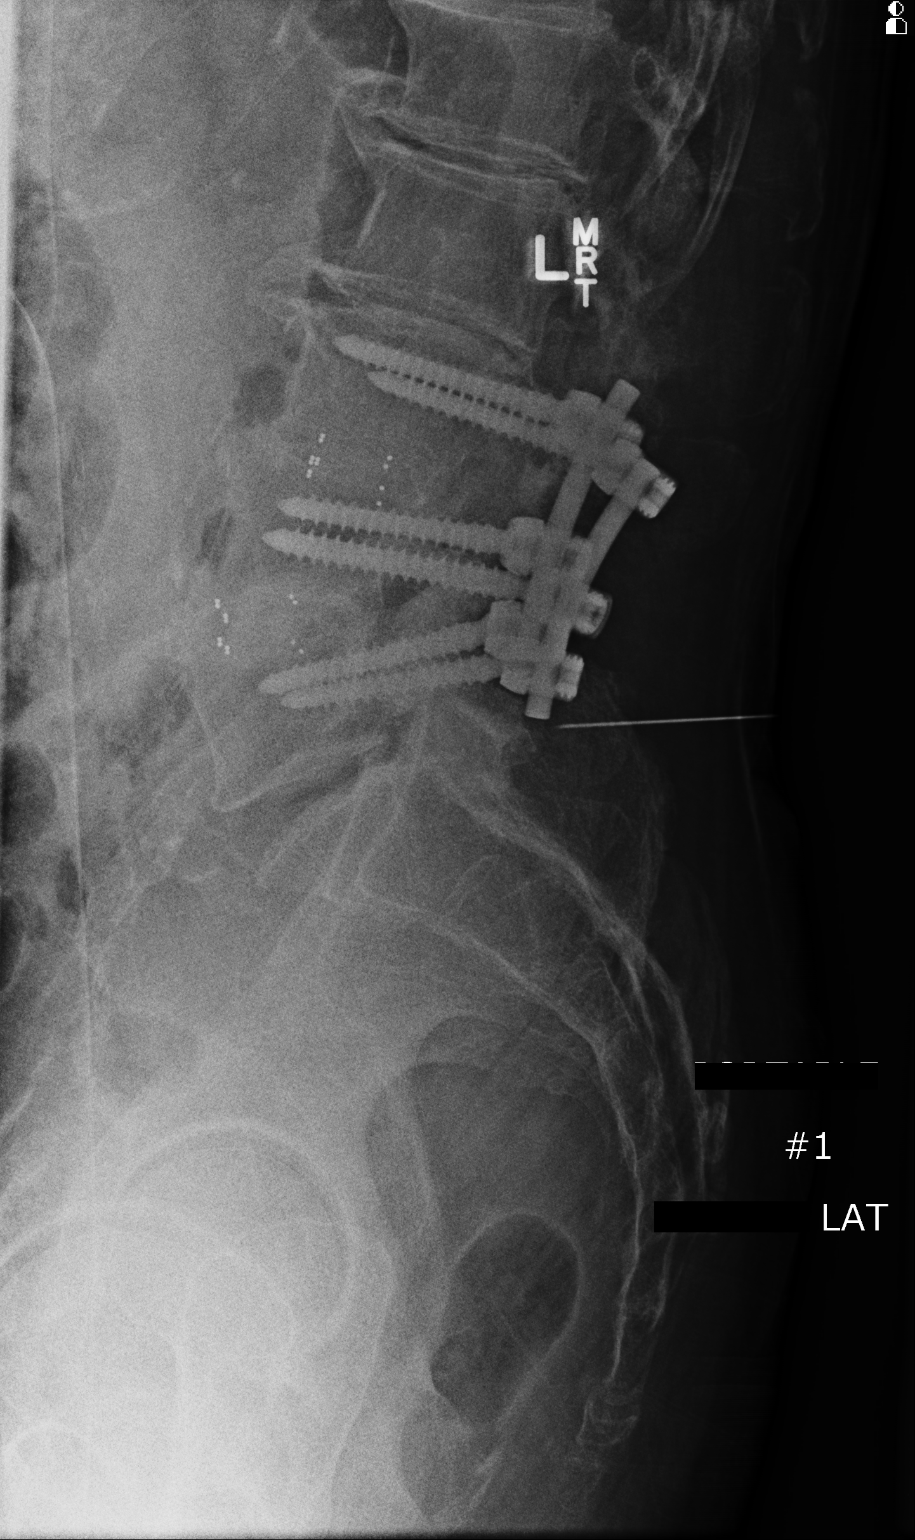

[1 of 1 positions shown; findings below may reference images not displayed]

FINDINGS: Single lateral view of the lumbar spine was obtained in the
operating room.

Pedicle screw and interbody fusion L3-4 and L4-5.

Needle is present overlying the L5 spinous process directed toward
the L5-S1 disc space.
IMPRESSION: Localizer needle overlying the L5 spinous process

## 2017-08-26 ENCOUNTER — Encounter: Payer: Self-pay | Admitting: Interventional Cardiology

## 2017-08-26 ENCOUNTER — Ambulatory Visit (INDEPENDENT_AMBULATORY_CARE_PROVIDER_SITE_OTHER): Payer: Medicare Other | Admitting: Interventional Cardiology

## 2017-08-26 VITALS — BP 104/64 | HR 51 | Ht 73.0 in | Wt 206.4 lb

## 2017-08-26 DIAGNOSIS — I714 Abdominal aortic aneurysm, without rupture, unspecified: Secondary | ICD-10-CM

## 2017-08-26 DIAGNOSIS — E7849 Other hyperlipidemia: Secondary | ICD-10-CM

## 2017-08-26 DIAGNOSIS — Z79899 Other long term (current) drug therapy: Secondary | ICD-10-CM | POA: Insufficient documentation

## 2017-08-26 DIAGNOSIS — I5042 Chronic combined systolic (congestive) and diastolic (congestive) heart failure: Secondary | ICD-10-CM | POA: Diagnosis not present

## 2017-08-26 DIAGNOSIS — I2510 Atherosclerotic heart disease of native coronary artery without angina pectoris: Secondary | ICD-10-CM | POA: Diagnosis not present

## 2017-08-26 DIAGNOSIS — I480 Paroxysmal atrial fibrillation: Secondary | ICD-10-CM | POA: Diagnosis not present

## 2017-08-26 DIAGNOSIS — I10 Essential (primary) hypertension: Secondary | ICD-10-CM | POA: Diagnosis not present

## 2017-08-26 DIAGNOSIS — Z7901 Long term (current) use of anticoagulants: Secondary | ICD-10-CM | POA: Diagnosis not present

## 2017-08-26 NOTE — Progress Notes (Signed)
Cardiology Office Note    Date:  08/26/2017   ID:  [name], [name] [birth year], MRN [id number]  PCP:  [name], MD  Cardiologist: Sinclair Grooms, MD   Chief Complaint  Patient presents with  . Atrial Fibrillation  . Coronary Artery Disease  . Congestive Heart Failure    History of Present Illness:  [name] is a 76 y.o. male for coronary artery disease, chronic combined systolic and diastolic heart failure, prior circumflex, LAD stenting, diabetes mellitus, atrial fibrillation, chronic anticoagulation, and history of severe polyneuropathy involving the lower extremities.  [name] is doing well.  He is accompanied by his wife.  He denies angina and prolonged palpitations.  He gets profound weakness and dyspnea when he develops atrial fibrillation and has had no such symptoms.  No exertional angina or nitroglycerin use has occurred since the last visit.  No bleeding on anticoagulation therapy.  No obvious medications   Past Medical History:  Diagnosis Date  . AAA (abdominal aortic aneurysm) ([place])    a. s/p repair 2009.  [name] Acid reflux    takes Prilosec and Protonix daily  . Arthritis    BacK  . CAD in native artery    a. a. prior inf MI 1993. b. PCI to LAD 05/1997. c. recurrent inferolateral MI complicated by V. fib arrest in 04/1998, prior PCI to OM1. d. 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  [name] Chronic combined systolic and diastolic CHF (congestive heart failure) ([place])   . Complication of anesthesia    -years ago hair fell out.;ileus after 2 of his surgeries  . Dry skin   . Foot drop, bilateral   . Hypercholesteremia   . Hypertension   . Ileus ([place])    After AAA  . Incisional hernia    "small, from AAA"  . Ischemic cardiomyopathy   . MI (myocardial infarction) ([place])   . 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 OSA on CPAP    uses CPAP--sleep study done at least 70yrs ago  . PAF (paroxysmal atrial fibrillation) ([place])   . Pain    back pain chronic- seen at pain clinic  .  Plantar fasciitis    bilatetral  . Thrombocytopenia ([place])   . Type II diabetes mellitus ([place])     Past Surgical History:  Procedure Laterality Date  . ABDOMINAL AORTIC ANEURYSM REPAIR    . BACK SURGERY  2012-2013 X 3   Miminal Invasive x 3in [place].  [name] CARDIAC CATHETERIZATION  2009/2012  . CARDIOVERSION N/A 04/12/2015   Procedure: CARDIOVERSION;  Surgeon: Sueanne Margarita, MD;  Location: [facility];  Service: Cardiovascular;  Laterality: N/A;  . CARDIOVERSION N/A 12/26/2016   Procedure: CARDIOVERSION;  Surgeon: Sueanne Margarita, MD;  [facility];  Service: Cardiovascular;  Laterality: N/A;  . COLONOSCOPY    . CORONARY ANGIOPLASTY WITH STENT PLACEMENT  1992; 1997  . LAPAROSCOPIC CHOLECYSTECTOMY    . LUMBAR LAMINECTOMY/DECOMPRESSION MICRODISCECTOMY Right 01/16/2016   Procedure: Right Lumbar five-Sacral one Laminectomy;  Surgeon: Kristeen Miss, MD;  Location: [facility];  Service: Neurosurgery;  Laterality: Right;  Right L5-S1 Laminectomy    Current Medications: Outpatient Medications Prior to Visit  Medication Sig Dispense Refill  . amiodarone (PACERONE) 200 MG tablet Take 1 tablet (200 mg total) by mouth daily. 90 tablet 3  . amitriptyline (ELAVIL) 25 MG tablet Take 75 mg by mouth at bedtime.     [name] atorvastatin (LIPITOR) 10 MG tablet Take  10 mg by mouth daily.    . Coenzyme Q10 (CO Q 10) 100 MG CAPS Take 100 mg by mouth daily.    . diazepam (VALIUM) 5 MG tablet Take 5 mg by mouth at bedtime as needed (leg cramps/sleep).     . empagliflozin (JARDIANCE) 10 MG TABS tablet Take 10 mg by mouth daily with breakfast.     . finasteride (PROPECIA) 1 MG tablet Take 1 mg by mouth daily as needed (for hair loss).    . Liniments (SALONPAS EX) Apply 1 application topically every 4 (four) hours as needed (for nerve pain). FORMULATION WITH LIDOCAINE    . metFORMIN (GLUCOPHAGE-XR) 500 MG 24 hr tablet Take 500-1,000 mg by mouth See admin instructions. 1,000 mg in the morning and 500 mg in the  evening    . Multiple Vitamins-Minerals (ONE-A-DAY MENS 50+ ADVANTAGE) TABS Take 1 tablet by mouth daily.    [name] omeprazole (PRILOSEC) 20 MG capsule Take 20 mg by mouth daily.    [name] oxyCODONE-acetaminophen (PERCOCET/ROXICET) 5-325 MG tablet Take 1 tablet by mouth every 6 (six) hours as needed (pain).     . ramipril (ALTACE) 10 MG capsule Take 10 mg by mouth daily.     . sildenafil (REVATIO) 20 MG tablet Take 20-100 mg by mouth daily as needed (for ED).    . vitamin B-12 (CYANOCOBALAMIN) 1000 MCG tablet Take 1,000 mcg by mouth daily.     . metoprolol tartrate (LOPRESSOR) 50 MG tablet TAKE ONE TABLET BY MOUTH TWICE DAILY (Patient not taking: Reported on 08/26/2017) 180 tablet 2  . XARELTO 20 MG TABS tablet TAKE ONE TABLET BY MOUTH ONCE DAILY WITH SUPPER (Patient not taking: Reported on 08/26/2017) 90 tablet 2   No facility-administered medications prior to visit.      Allergies:   Cymbalta [duloxetine hcl]; Neurontin [gabapentin]; Lyrica [pregabalin]; and Keppra [levetiracetam]   Social History   Social History  . Marital status: Married    Spouse name: N/A  . Number of children: N/A  . Years of education: N/A   Social History Main Topics  . Smoking status: Former Smoker    Packs/day: 1.00    Years: 33.00    Types: Cigarettes    Quit date: 01/01/1992  . Smokeless tobacco: Never Used  . Alcohol use Yes     Comment: 6/30;2017 "GLASS OF WINE Q COUPLE MONTHS, IF THAT"  . Drug use: No  . Sexual activity: Yes   Other Topics Concern  . None   Social History Narrative  . None     Family History:  The patient's family history includes Arthritis in his father; Coronary artery disease in his father and mother; Diabetes in his father and mother; Rheum arthritis in his father.   ROS:   Please see the history of present illness.    Decreased hearing easy bruising and chronic back and arthritic complaints. All other systems reviewed and are negative.   PHYSICAL EXAM:   VS:  BP 104/64 (BP  Location: Left Arm)   Pulse (!) 51   Ht 6\' 1"  (1.854 m)   Wt 206 lb 6.4 oz (93.6 kg)   BMI 27.23 kg/m    GEN: Well nourished, well developed, in no acute distress  HEENT: normal  Neck: no JVD, carotid bruits, or masses Cardiac: RRR; no murmurs, rubs, or gallops,no edema  Respiratory:  clear to auscultation bilaterally, normal work of breathing GI: soft, nontender, nondistended, + BS MS: no deformity or atrophy  Skin: warm and  dry, no rash Neuro:  Alert and Oriented x 3, Strength and sensation are intact Psych: euthymic mood, full affect  Wt Readings from Last 3 Encounters:  08/26/17 206 lb 6.4 oz (93.6 kg)  06/13/17 203 lb 6.4 oz (92.3 kg)  02/27/17 210 lb 12.8 oz (95.6 kg)      Studies/Labs Reviewed:   EKG:  EKG  none  Recent Labs: 12/21/2016: ALT 22 12/22/2016: B Natriuretic Peptide 272.9 12/23/2016: Magnesium 2.0 12/24/2016: TSH 0.746 06/12/2017: BUN 22; Creatinine, Ser 1.07; Hemoglobin 14.2; Platelets 134; Potassium 4.2; Sodium 138   Lipid Panel    Component Value Date/Time   CHOL  11/26/2010 0750    147        ATP III CLASSIFICATION:  <200     mg/dL   Desirable  200-239  mg/dL   Borderline High  >=240    mg/dL   High          TRIG 132 11/26/2010 0750   HDL 29 (L) 11/26/2010 0750   CHOLHDL 5.1 11/26/2010 0750   VLDL 26 11/26/2010 0750   LDLCALC  11/26/2010 0750    92        Total Cholesterol/HDL:CHD Risk Coronary Heart Disease Risk Table                     Men   Women  1/2 Average Risk   3.4   3.3  Average Risk       5.0   4.4  2 X Average Risk   9.6   7.1  3 X Average Risk  23.4   11.0        Use the calculated Patient Ratio above and the CHD Risk Table to determine the patient's CHD Risk.        ATP III CLASSIFICATION (LDL):  <100     mg/dL   Optimal  100-129  mg/dL   Near or Above                    Optimal  130-159  mg/dL   Borderline  160-189  mg/dL   High  >190     mg/dL   Very High    Additional studies/ records that were reviewed today  include:  Reviewed the recent admission data concerning the patient's near syncope.  This occurred Kristopher 2018.  Episode was felt related to hypoglycemia.  No significant other medical issues were discovered.    ASSESSMENT:    1. Chronic combined systolic and diastolic heart failure ([place])   2. Atherosclerosis of native coronary artery of native heart without angina pectoris   3. Abdominal aortic aneurysm (AAA) without rupture ([place])   4. Essential hypertension   5. Paroxysmal atrial fibrillation with RVR (H[facility])   6. Long term current use of anticoagulant therapy   7. Other hyperlipidemia   8. On amiodarone therapy      PLAN:  In order of problems listed above:  1. No evidence of volume overload. 2. He denies angina.  No evaluation indicated at this time. 3. Abdominal aortic aneurysm.  Last evaluation evaluated 12/12/14 demonstrating a 3.1 x 3.7 cm infrarenal and evidence of prior surgical aneurysm repair. 4. Blood pressures under excellent control currently.  No change in therapy 5. Maintaining sinus rhythm based upon clinical exam.  He is in trigeminy today.  Sinus rhythm is being maintained with amiodarone 200 mg daily.  No change in therapy.  He is to have blood work done with  primary care in 2 days.  We will request that a TSH and liver panel be part of his testing. 6. No bleeding on Xarelto.  Samples are given 7. LDL target is less than 70.  Most recent was 62 in June 2018. 8. Continue amiodarone 200 mg daily.  TSH and hepatic panel on return in 4 months.  Also determine levels when he sees Dr. Laurann Montana within this next week.  Continue current clinical course.  No change in therapy.  Call if angina or arrhythmia.  Overall seems to be doing well.  We will change timing of our next visit so that stagger appointments with physicians.  He is seeing Dr. [name] and myself every 6 months with appointments being very close to each other.  We will try to have quarterly physician visits  with our appointments being appropriately staggered.    Medication Adjustments/Labs and Tests Ordered: Current medicines are reviewed at length with the patient today.  Concerns regarding medicines are outlined above.  Medication changes, Labs and Tests ordered today are listed in the Patient Instructions below. Patient Instructions  Medication Instructions:  Your physician recommends that you continue on your current medications as directed. Please refer to the Current Medication list given to you today.  Labwork: Take prescription provided to pcp to have labs drawn(TSH and liver function panel).   Testing/Procedures: None ordered   Follow-Up: Your physician wants you to follow-up around April with Dr. Tamala Julian. You will receive a reminder letter in the mail two months in advance. If you don't receive a letter, please call our office to schedule the follow-up appointment.   Any Other Special Instructions Will Be Listed Below (If Applicable).     If you need a refill on your cardiac medications before your next appointment, please call your pharmacy.     Signed, Sinclair Grooms, MD  08/26/2017 12:49 PM    [address], [place], [place]  [address] Phone: [phone]; Fax: [phone]

## 2017-08-26 NOTE — Patient Instructions (Addendum)
Medication Instructions:  Your physician recommends that you continue on your current medications as directed. Please refer to the Current Medication list given to you today.  Labwork: Take prescription provided to pcp to have labs drawn(TSH and liver function panel).   Testing/Procedures: None ordered   Follow-Up: Your physician wants you to follow-up around April with Dr. Tamala Julian. You will receive a reminder letter in the mail two months in advance. If you don't receive a letter, please call our office to schedule the follow-up appointment.   Any Other Special Instructions Will Be Listed Below (If Applicable).     If you need a refill on your cardiac medications before your next appointment, please call your pharmacy.

## 2017-09-01 ENCOUNTER — Telehealth: Payer: Self-pay | Admitting: Interventional Cardiology

## 2017-09-01 NOTE — Telephone Encounter (Signed)
Called pt and left message for pt to call back for lab results.

## 2017-09-01 NOTE — Telephone Encounter (Signed)
Informed pt of lab results. Pt verbalized understanding. 

## 2017-09-01 NOTE — Telephone Encounter (Signed)
Follow up     Pt is returning call for lab results

## 2017-10-19 ENCOUNTER — Other Ambulatory Visit: Payer: Self-pay

## 2017-10-19 ENCOUNTER — Encounter (HOSPITAL_BASED_OUTPATIENT_CLINIC_OR_DEPARTMENT_OTHER): Payer: Self-pay | Admitting: Emergency Medicine

## 2017-10-19 ENCOUNTER — Emergency Department (HOSPITAL_BASED_OUTPATIENT_CLINIC_OR_DEPARTMENT_OTHER): Payer: Medicare Other

## 2017-10-19 ENCOUNTER — Emergency Department (HOSPITAL_BASED_OUTPATIENT_CLINIC_OR_DEPARTMENT_OTHER)
Admission: EM | Admit: 2017-10-19 | Discharge: 2017-10-19 | Disposition: A | Discharge status: Home / Self Care | Payer: Medicare Other | Attending: Emergency Medicine | Admitting: Emergency Medicine

## 2017-10-19 DIAGNOSIS — W0110XA Fall on same level from slipping, tripping and stumbling with subsequent striking against unspecified object, initial encounter: Secondary | ICD-10-CM | POA: Diagnosis not present

## 2017-10-19 DIAGNOSIS — Y999 Unspecified external cause status: Secondary | ICD-10-CM | POA: Diagnosis not present

## 2017-10-19 DIAGNOSIS — I259 Chronic ischemic heart disease, unspecified: Secondary | ICD-10-CM | POA: Diagnosis not present

## 2017-10-19 DIAGNOSIS — Z79899 Other long term (current) drug therapy: Secondary | ICD-10-CM | POA: Diagnosis not present

## 2017-10-19 DIAGNOSIS — S8992XA Unspecified injury of left lower leg, initial encounter: Secondary | ICD-10-CM | POA: Diagnosis not present

## 2017-10-19 DIAGNOSIS — Y92014 Private driveway to single-family (private) house as the place of occurrence of the external cause: Secondary | ICD-10-CM | POA: Insufficient documentation

## 2017-10-19 DIAGNOSIS — E119 Type 2 diabetes mellitus without complications: Secondary | ICD-10-CM | POA: Diagnosis not present

## 2017-10-19 DIAGNOSIS — Z87891 Personal history of nicotine dependence: Secondary | ICD-10-CM | POA: Diagnosis not present

## 2017-10-19 DIAGNOSIS — I110 Hypertensive heart disease with heart failure: Secondary | ICD-10-CM | POA: Insufficient documentation

## 2017-10-19 DIAGNOSIS — S82832A Other fracture of upper and lower end of left fibula, initial encounter for closed fracture: Secondary | ICD-10-CM | POA: Diagnosis not present

## 2017-10-19 DIAGNOSIS — I5042 Chronic combined systolic (congestive) and diastolic (congestive) heart failure: Secondary | ICD-10-CM | POA: Insufficient documentation

## 2017-10-19 DIAGNOSIS — Z7984 Long term (current) use of oral hypoglycemic drugs: Secondary | ICD-10-CM | POA: Diagnosis not present

## 2017-10-19 DIAGNOSIS — S99912A Unspecified injury of left ankle, initial encounter: Secondary | ICD-10-CM | POA: Diagnosis present

## 2017-10-19 DIAGNOSIS — Y9389 Activity, other specified: Secondary | ICD-10-CM | POA: Diagnosis not present

## 2017-10-19 DIAGNOSIS — Z7901 Long term (current) use of anticoagulants: Secondary | ICD-10-CM | POA: Insufficient documentation

## 2017-10-19 MED ORDER — IBUPROFEN 600 MG PO TABS: 600.0000 mg | ORAL_TABLET | Freq: Four times a day (QID) | ORAL | 0 refills | Status: DC | PRN

## 2017-10-19 MED ORDER — IBUPROFEN 400 MG PO TABS
600.0000 mg | ORAL_TABLET | Freq: Once | ORAL | Status: AC
  Administered 2017-10-19: 600 mg via ORAL
  Filled 2017-10-19: qty 1

## 2017-10-19 MED ORDER — HYDROCODONE-ACETAMINOPHEN 5-325 MG PO TABS
1.0000 | ORAL_TABLET | Freq: Once | ORAL | Status: AC
  Administered 2017-10-19: 1 via ORAL
  Filled 2017-10-19: qty 1

## 2017-10-19 MED ORDER — HYDROCODONE-ACETAMINOPHEN 5-325 MG PO TABS: 1.0000 | ORAL_TABLET | ORAL | 0 refills | Status: DC | PRN

## 2017-10-19 NOTE — ED Notes (Signed)
Patient and family member has no further questions concerning the cam walker that was applied. Instructions on how to take on and off and care for the ankle were given. Ice pack was given to patient.

## 2017-10-19 NOTE — ED Notes (Signed)
ED Provider at bedside. 

## 2017-10-19 NOTE — ED Triage Notes (Signed)
Patient states that he fell onto the driveway yesterday and hurt his left ankle and leg. The patient also reports that he is having pain to his left knee.

## 2017-10-19 NOTE — ED Provider Notes (Signed)
[facility] EMERGENCY DEPARTMENT Provider Note   CSN: [id number] Arrival date & time: 10/19/17  1254     History   Chief Complaint Chief Complaint  Patient presents with  . Fall    HPI [name] is a 76 y.o. male.  Pt presents to th eED today with left ankle and knee pain.  Pt said he tripped yesterday in the driveway.  He is on Xarelto for a.fib.  The pt said that he did not hit his head or have loc.  Pt has been able to walk on it, but it hurts a lot.      Past Medical History:  Diagnosis Date  . AAA (abdominal aortic aneurysm) ([place])    a. s/p repair 2009.  [name] Acid reflux    takes Prilosec and Protonix daily  . Arthritis    BacK  . CAD in native artery    a. a. prior inf MI 1993. b. PCI to LAD 05/1997. c. recurrent inferolateral MI complicated by V. fib arrest in 04/1998, prior PCI to OM1. d. 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  [name] Chronic combined systolic and diastolic CHF (congestive heart failure) ([place])   . Complication of anesthesia    -years ago hair fell out.;ileus after 2 of his surgeries  . Dry skin   . Foot drop, bilateral   . Hypercholesteremia   . Hypertension   . Ileus ([place])    After AAA  . Incisional hernia    "small, from AAA"  . Ischemic cardiomyopathy   . MI (myocardial infarction) ([place])   . 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 OSA on CPAP    uses CPAP--sleep study done at least 25yrs ago  . PAF (paroxysmal atrial fibrillation) ([place])   . Pain    back pain chronic- seen at pain clinic  . Plantar fasciitis    bilatetral  . Thrombocytopenia ([place])   . Type II diabetes mellitus [facility])     Patient Active Problem List   Diagnosis Date Noted  . On amiodarone therapy 08/26/2017  . Near syncope 06/11/2017  . Hypoxia   . Pneumonia of left lower lobe due to infectious organism ([place])   . Gastroenteritis and colitis, viral 04/27/2016  . Orthostasis 04/26/2016  . Nausea vomiting and diarrhea 04/26/2016  . Lumbar radiculopathy, chronic  01/16/2016  . Hematoma of left kidney 11/01/2015  . Hyperlipidemia 12/12/2014  . SIRS (systemic inflammatory response syndrome) ([place]) 12/12/2014  . Influenza due to identified novel influenza A virus with other respiratory manifestations 11/07/2013  . Long term current use of anticoagulant therapy 09/08/2013    Class: Chronic  . Coronary atherosclerosis of native coronary artery 08/16/2013    Class: Chronic  . Chronic combined systolic and diastolic heart failure ([facility]) 08/16/2013    Class: Chronic  . Paroxysmal atrial fibrillation with RVR ([place]) 08/13/2013  . Paralytic ileus ([place]) 05/07/2012  . Nausea and vomiting 01/01/2012  . DM (diabetes mellitus), type 2, uncontrolled w/neurologic complication ([place]) 46/96/2952  . Hypertension   . AAA (abdominal aortic aneurysm) ([place])   . Acid reflux   . Sleep apnea     Past Surgical History:  Procedure Laterality Date  . ABDOMINAL AORTIC ANEURYSM REPAIR    . BACK SURGERY  2012-2013 X 3   Miminal Invasive x 3in [place].  [name] CARDIAC CATHETERIZATION  2009/2012  . CARDIOVERSION N/A 04/12/2015   Procedure: CARDIOVERSION;  Surgeon: Sueanne Margarita, MD;  Location: [facility];  Service: Cardiovascular;  Laterality: N/A;  . CARDIOVERSION N/A 12/26/2016   Procedure: CARDIOVERSION;  Surgeon: Sueanne Margarita, MD;  [facility];  Service: Cardiovascular;  Laterality: N/A;  . COLONOSCOPY    . CORONARY ANGIOPLASTY WITH STENT PLACEMENT  1992; 1997  . LAPAROSCOPIC CHOLECYSTECTOMY    . LUMBAR LAMINECTOMY/DECOMPRESSION MICRODISCECTOMY Right 01/16/2016   Procedure: Right Lumbar five-Sacral one Laminectomy;  Surgeon: Kristeen Miss, MD;  Location: [facility];  Service: Neurosurgery;  Laterality: Right;  Right L5-S1 Laminectomy       Home Medications    Prior to Admission medications   Medication Sig Start Date End Date Taking? Authorizing Provider  amiodarone (PACERONE) 200 MG tablet Take 1 tablet (200 mg total) by mouth daily. 02/27/17   Belva Crome, MD  amitriptyline (ELAVIL) 25 MG tablet Take 75 mg by mouth at bedtime.  02/19/17   [provider]  atorvastatin (LIPITOR) 10 MG tablet Take 10 mg by mouth daily.    [provider]  Coenzyme Q10 (CO Q 10) 100 MG CAPS Take 100 mg by mouth daily.    [provider]  diazepam (VALIUM) 5 MG tablet Take 5 mg by mouth at bedtime as needed (leg cramps/sleep).  10/31/15   [provider]  empagliflozin (JARDIANCE) 10 MG TABS tablet Take 10 mg by mouth daily with breakfast.     [provider]  finasteride (PROPECIA) 1 MG tablet Take 1 mg by mouth daily as needed (for hair loss).    [provider]  HYDROcodone-acetaminophen (NORCO/VICODIN) 5-325 MG tablet Take 1 tablet by mouth every 4 (four) hours as needed. 10/19/17   Isla Pence, MD  ibuprofen (ADVIL,MOTRIN) 600 MG tablet Take 1 tablet (600 mg total) by mouth every 6 (six) hours as needed. 10/19/17   Isla Pence, MD  Liniments [facility] EX) Apply 1 application topically every 4 (four) hours as needed (for nerve pain). FORMULATION WITH LIDOCAINE    [provider]  metFORMIN (GLUCOPHAGE-XR) 500 MG 24 hr tablet Take 500-1,000 mg by mouth See admin instructions. 1,000 mg in the morning and 500 mg in the evening 10/12/15   [provider]  metoprolol tartrate (LOPRESSOR) 50 MG tablet Take 50 mg by mouth 2 (two) times daily.    [provider]  Multiple Vitamins-Minerals (ONE-A-DAY MENS 50+ ADVANTAGE) TABS Take 1 tablet by mouth daily.    [provider]  omeprazole (PRILOSEC) 20 MG capsule Take 20 mg by mouth daily. 09/14/12   [provider]  oxyCODONE-acetaminophen (PERCOCET/ROXICET) 5-325 MG tablet Take 1 tablet by mouth every 6 (six) hours as needed (pain).     [provider]  ramipril (ALTACE) 10 MG capsule Take 10 mg by mouth daily.     [provider]  rivaroxaban (XARELTO) 20 MG TABS tablet Take 20 mg by mouth daily with  supper.    [provider]  sildenafil (REVATIO) 20 MG tablet Take 20-100 mg by mouth daily as needed (for ED).    [provider]  vitamin B-12 (CYANOCOBALAMIN) 1000 MCG tablet Take 1,000 mcg by mouth daily.     [provider]    Family History Family History  Problem Relation Age of Onset  . Diabetes Mother   . Coronary artery disease Mother   . Diabetes Father   . Arthritis Father   . Rheum arthritis Father   . Coronary artery disease Father     Social History Social History   Tobacco Use  .  Smoking status: Former Smoker    Packs/day: 1.00    Years: 33.00    Pack years: 33.00    Types: Cigarettes    Last attempt to quit: 01/01/1992    Years since quitting: 25.8  . Smokeless tobacco: Never Used  Substance Use Topics  . Alcohol use: Yes    Comment: 6/30;2017 "GLASS OF WINE Q COUPLE MONTHS, IF THAT"  . Drug use: No     Allergies   Cymbalta [duloxetine hcl]; Neurontin [gabapentin]; Lyrica [pregabalin]; and Keppra [levetiracetam]   Review of Systems Review of Systems  Musculoskeletal:       Left knee and ankle pain  All other systems reviewed and are negative.    Physical Exam Updated Vital Signs BP 123/66 (BP Location: Left Arm)   Pulse 72   Temp 97.8 F (36.6 C) (Oral)   Resp 20   Ht 6\' 1"  (1.854 m)   Wt 90.3 kg (199 lb)   SpO2 98%   BMI 26.25 kg/m   Physical Exam  Constitutional: He is oriented to person, place, and time. He appears well-developed and well-nourished.  HENT:  Head: Normocephalic and atraumatic.  Right Ear: External ear normal.  Left Ear: External ear normal.  Nose: Nose normal.  Mouth/Throat: Oropharynx is clear and moist.  Eyes: Conjunctivae and EOM are normal. Pupils are equal, round, and reactive to light.  Neck: Normal range of motion. Neck supple.  Cardiovascular: Normal rate, regular rhythm, normal heart sounds and intact distal pulses.  Pulmonary/Chest: Effort normal and breath sounds normal.    Abdominal: Soft. Bowel sounds are normal.  Musculoskeletal:       Left knee: He exhibits bony tenderness.       Left ankle: He exhibits swelling. Tenderness. Lateral malleolus tenderness found.  Neurological: He is alert and oriented to person, place, and time.  Skin: Skin is warm. Capillary refill takes less than 2 seconds.  Nursing note and vitals reviewed.    ED Treatments / Results  Labs (all labs ordered are listed, but only abnormal results are displayed) Labs Reviewed - No data to display  EKG  EKG Interpretation None       Radiology Dg Ankle Complete Left  Result Date: 10/19/2017 CLINICAL DATA:  Fall with left ankle injury and pain. Initial encounter. EXAM: LEFT ANKLE COMPLETE - 3+ VIEW COMPARISON:  None. FINDINGS: There is oblique nondisplaced fracture through the distal fibula. Significant overlying soft tissue swelling is present. The ankle mortise shows slight asymmetry and widening laterally. No other fractures identified. IMPRESSION: Nondisplaced fracture of the distal fibula with significant overlying soft tissue swelling. Electronically Signed   By: Aletta Edouard M.D.   On: 10/19/2017 13:42   Dg Knee Complete 4 Views Left  Result Date: 10/19/2017 CLINICAL DATA:  Fall today with left knee injury. Initial encounter. EXAM: LEFT KNEE - COMPLETE 4+ VIEW COMPARISON:  None. FINDINGS: No evidence of fracture, dislocation, or joint effusion. Mild patellar osteophyte formation. Soft tissues are unremarkable. There are some vascular calcifications in the popliteal artery. IMPRESSION: No acute findings. Electronically Signed   By: Aletta Edouard M.D.   On: 10/19/2017 13:37    Procedures Procedures (including critical care time)  Medications Ordered in ED Medications  HYDROcodone-acetaminophen (NORCO/VICODIN) 5-325 MG per tablet 1 tablet (not administered)  ibuprofen (ADVIL,MOTRIN) tablet 600 mg (not administered)     Initial Impression / Assessment and Plan / ED  Course  I have reviewed the triage vital signs and the nursing notes.  Pertinent  labs & imaging results that were available during my care of the patient were reviewed by me and considered in my medical decision making (see chart for details).    Pt placed in a cam walker and is instructed to f/u with ortho.  Return if worse.  Final Clinical Impressions(s) / ED Diagnoses   Final diagnoses:  Other closed fracture of distal end of left fibula, initial encounter    ED Discharge Orders        Ordered    ibuprofen (ADVIL,MOTRIN) 600 MG tablet  Every 6 hours PRN     10/19/17 1355    HYDROcodone-acetaminophen (NORCO/VICODIN) 5-325 MG tablet  Every 4 hours PRN     10/19/17 1355       Isla Pence, MD 10/19/17 1404

## 2017-12-01 IMAGING — DX DG ABDOMEN ACUTE W/ 1V CHEST
4 series · 4 of 4 positions shown · non-contrast
Comparison: None.

CLINICAL DATA: Vomiting, diarrhea.

EXAM:
DG ABDOMEN ACUTE W/ 1V CHEST

[abdomen supine]
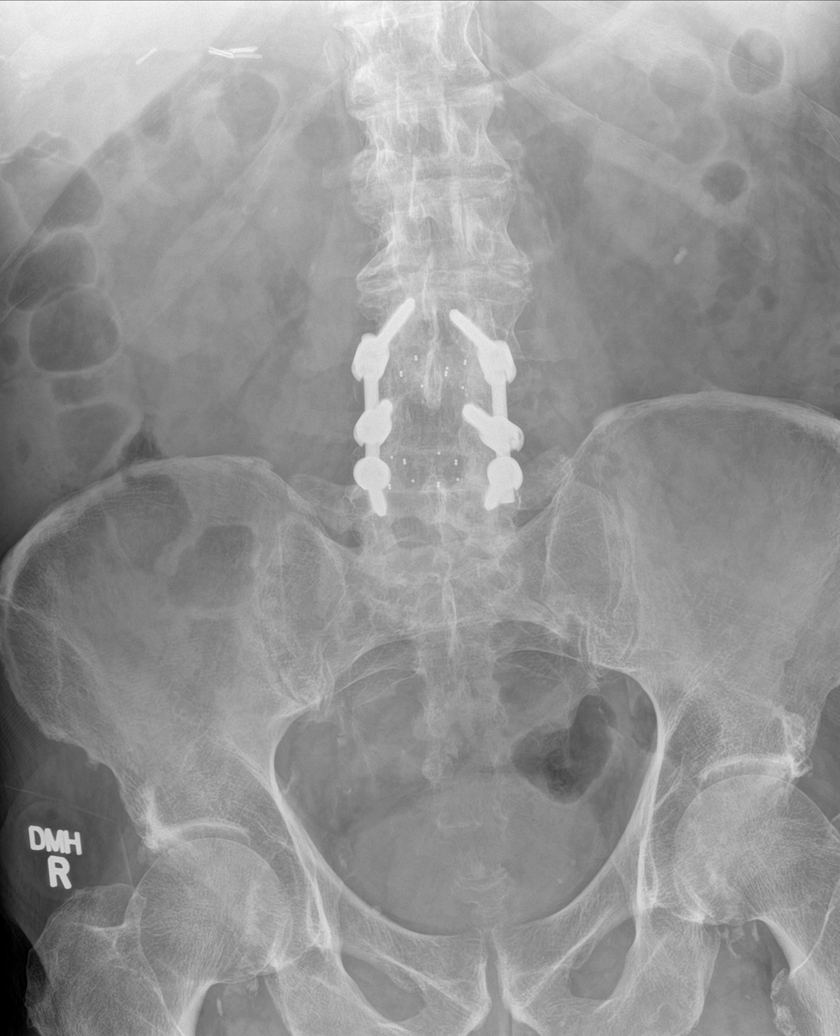

[abdomen decu (1 of 2)]
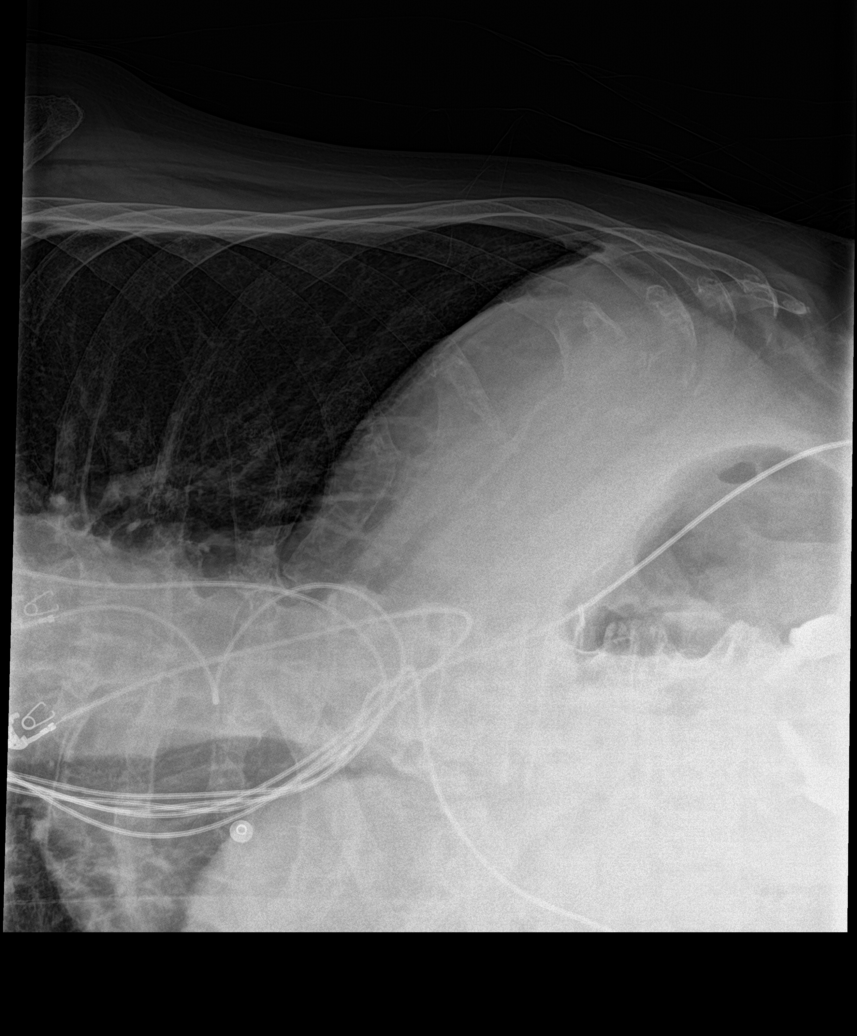

[abdomen decu (2 of 2)]
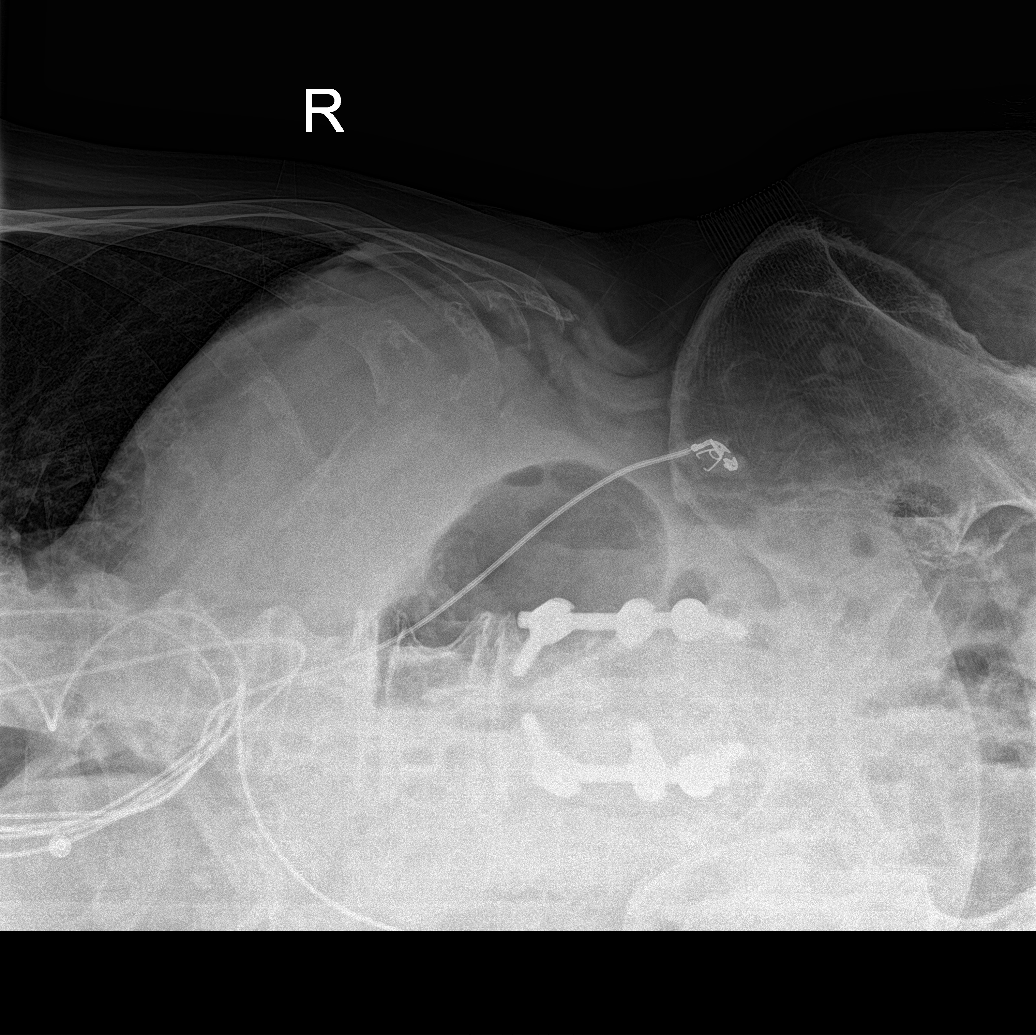

[chest pa]
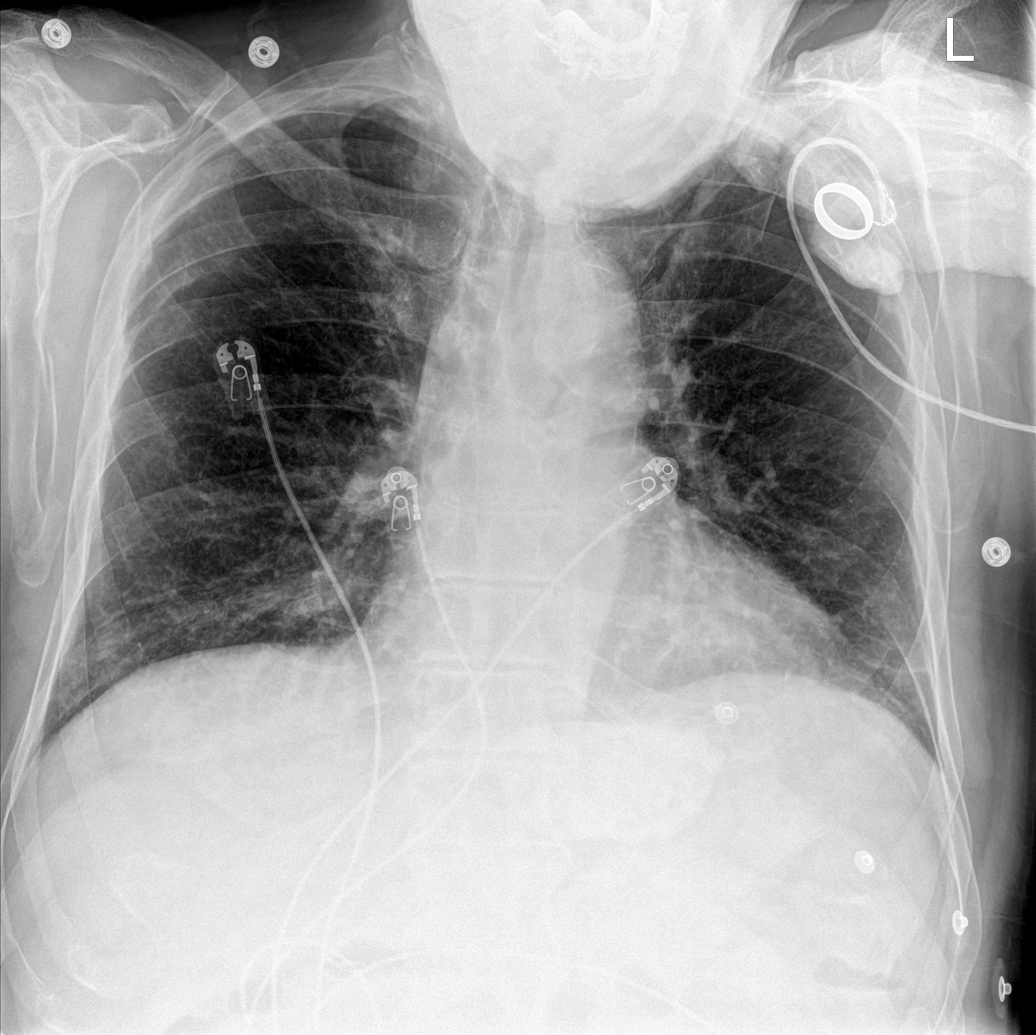

[4 of 4 positions shown; findings below may reference images not displayed]

FINDINGS: There is no evidence of dilated bowel loops or free intraperitoneal
air. Status post cholecystectomy. Status post surgical fusion of
lower lumbar spine. Left renal calculus is noted. Heart size and
mediastinal contours are within normal limits. Atherosclerosis of
abdominal aorta is noted. Both lungs are clear.
IMPRESSION: Left renal calculus. No evidence of bowel obstruction or ileus.
Aortic atherosclerosis. No acute cardiopulmonary disease.

## 2018-02-04 ENCOUNTER — Telehealth: Payer: Self-pay | Admitting: Interventional Cardiology

## 2018-02-04 ENCOUNTER — Emergency Department (HOSPITAL_COMMUNITY)
Admission: EM | Admit: 2018-02-04 | Discharge: 2018-02-04 | Disposition: A | Discharge status: Home / Self Care | Payer: Medicare Other | Attending: Emergency Medicine | Admitting: Emergency Medicine

## 2018-02-04 ENCOUNTER — Encounter (HOSPITAL_COMMUNITY): Payer: Self-pay | Admitting: Emergency Medicine

## 2018-02-04 ENCOUNTER — Emergency Department (HOSPITAL_COMMUNITY): Payer: Medicare Other

## 2018-02-04 DIAGNOSIS — R079 Chest pain, unspecified: Secondary | ICD-10-CM

## 2018-02-04 DIAGNOSIS — I2510 Atherosclerotic heart disease of native coronary artery without angina pectoris: Secondary | ICD-10-CM | POA: Insufficient documentation

## 2018-02-04 DIAGNOSIS — Z7984 Long term (current) use of oral hypoglycemic drugs: Secondary | ICD-10-CM | POA: Diagnosis not present

## 2018-02-04 DIAGNOSIS — Z87891 Personal history of nicotine dependence: Secondary | ICD-10-CM | POA: Insufficient documentation

## 2018-02-04 DIAGNOSIS — E119 Type 2 diabetes mellitus without complications: Secondary | ICD-10-CM | POA: Diagnosis not present

## 2018-02-04 DIAGNOSIS — R109 Unspecified abdominal pain: Secondary | ICD-10-CM | POA: Diagnosis not present

## 2018-02-04 DIAGNOSIS — R072 Precordial pain: Secondary | ICD-10-CM | POA: Insufficient documentation

## 2018-02-04 DIAGNOSIS — Z7901 Long term (current) use of anticoagulants: Secondary | ICD-10-CM | POA: Insufficient documentation

## 2018-02-04 DIAGNOSIS — I110 Hypertensive heart disease with heart failure: Secondary | ICD-10-CM | POA: Insufficient documentation

## 2018-02-04 DIAGNOSIS — I252 Old myocardial infarction: Secondary | ICD-10-CM | POA: Diagnosis not present

## 2018-02-04 DIAGNOSIS — I5042 Chronic combined systolic (congestive) and diastolic (congestive) heart failure: Secondary | ICD-10-CM | POA: Insufficient documentation

## 2018-02-04 LAB — BASIC METABOLIC PANEL
Anion gap: 13 (ref 5–15)
BUN: 27 mg/dL — ABNORMAL HIGH (ref 6–20)
CO2: 23 mmol/L (ref 22–32)
Calcium: 9.4 mg/dL (ref 8.9–10.3)
Chloride: 101 mmol/L (ref 101–111)
Creatinine, Ser: 1.22 mg/dL (ref 0.61–1.24)
GFR calc Af Amer: >60 mL/min (ref >60)
GFR calc non Af Amer: 55 mL/min — ABNORMAL LOW (ref >60)
Glucose, Bld: 303 mg/dL — ABNORMAL HIGH (ref 65–99)
Potassium: 4.9 mmol/L (ref 3.5–5.1)
Sodium: 137 mmol/L (ref 135–145)

## 2018-02-04 LAB — CBC
HCT: 47.2 % (ref 39.0–52.0)
Hemoglobin: 15.7 g/dL (ref 13.0–17.0)
MCH: 30.7 pg (ref 26.0–34.0)
MCHC: 33.3 g/dL (ref 30.0–36.0)
MCV: 92.2 fL (ref 78.0–100.0)
Platelets: 136 10*3/uL — ABNORMAL LOW (ref 150–400)
RBC: 5.12 MIL/uL (ref 4.22–5.81)
RDW: 14.3 % (ref 11.5–15.5)
WBC: 5.5 10*3/uL (ref 4.0–10.5)

## 2018-02-04 LAB — I-STAT TROPONIN, ED
Troponin i, poc: 0 ng/mL (ref 0.00–0.08)
Troponin i, poc: 0.01 ng/mL (ref 0.00–0.08)

## 2018-02-04 MED ORDER — NITROGLYCERIN 0.4 MG SL SUBL
0.4000 mg | SUBLINGUAL_TABLET | SUBLINGUAL | Status: DC | PRN
  Filled 2018-02-04: qty 1

## 2018-02-04 MED ORDER — ASPIRIN 81 MG PO CHEW
162.0000 mg | CHEWABLE_TABLET | Freq: Once | ORAL | Status: AC
  Administered 2018-02-04: 162 mg via ORAL
  Filled 2018-02-04: qty 2

## 2018-02-04 NOTE — Telephone Encounter (Signed)
Spoke with pt and he has been feeling bad for a few days.  Been having chest pressure and tightness today.  BP was 137/65, HR 62.  Advised pt HR could be incorrect if he is in Afib.  Pt wears an apple watch and said the EKG on there is "all over the place".  Pt denies other sx at this time but was having nausea previously.  No availability in [facility] or [facility] office this week to have pt seen.  Advised pt he needs to report to ER based on his symptoms.  Pt asked that I send message to Dr. Tamala Julian and have him call him when he can.  Advised pt that Dr. Tamala Julian is working at the hospital and I'm not sure when he would see the message or if he would have time to call.  Advised pt he really needed to go ahead and report to ER.  At first pt stated he would risk waiting for Dr. Thompson Caul call but then agreed to report to ER.  Will route to Dr. Tamala Julian to make him aware.

## 2018-02-04 NOTE — ED Triage Notes (Signed)
Pt to ER for evaluation of 2 days chest tightness and fatigue. Reports hx of atrial fibrillation. Hx of MI with stent placement. Pt a/o x4.

## 2018-02-04 NOTE — Telephone Encounter (Signed)
New Message   Pt c/o of Chest Pain: STAT if CP now or developed within 24 hours  1. Are you having CP right now? Patient does not describe it has pain but more as pressure  2. Are you experiencing any other symptoms (ex. SOB, nausea, vomiting, sweating)? Nausea and fatigue 3. How long have you been experiencing CP? This morning   4. Is your CP continuous or coming and going? continous  5. Have you taken Nitroglycerin? No  ?

## 2018-02-04 NOTE — Discharge Instructions (Signed)
Follow-up with your PCP and cardiologist.   Return for worsening symptoms.

## 2018-02-04 NOTE — ED Provider Notes (Signed)
[place] EMERGENCY DEPARTMENT Provider Note   CSN: [id number] Arrival date & time: 02/04/18  1406     History   Chief Complaint Chief Complaint  Patient presents with  . Chest Pain    HPI [name] is a 77 y.o. male.  77 yo M with a chief complaint of chest pressure.  This is been going on for the past couple days associated with some fatigue.  Denies radiation denies shortness of breath.  He has had 2 heart attacks requiring stents in the past.  These were in the remote past and he remembers being diaphoretic and short of breath as well.  He felt that the pressure then was also significantly worse.  He also has paroxysmal atrial fibrillation and he thinks that his A. fib may have come back.  He denies cough congestion fever chills myalgias denies history of PE or DVT denies hemoptysis denies lower extremity edema denies recent surgery or hospitalization denies prolonged travel or testosterone use.  The history is provided by the patient.  Chest Pain   This is a new problem. The current episode started 2 days ago. The problem occurs constantly. The problem has not changed since onset.The pain is associated with exertion and movement. The pain is present in the substernal region. The pain is at a severity of 8/10. The pain is moderate. The quality of the pain is described as exertional and pressure-like. The pain does not radiate. Duration of episode(s) is 2 hours. Pertinent negatives include no abdominal pain, no fever, no headaches, no palpitations, no shortness of breath and no vomiting. He has tried rest for the symptoms. The treatment provided moderate relief.  His past medical history is significant for MI.    Past Medical History:  Diagnosis Date  . AAA (abdominal aortic aneurysm) ([place])    a. s/p repair 2009.  [name] Acid reflux    takes Prilosec and Protonix daily  . Arthritis    BacK  . CAD in native artery    a. a. prior inf MI 1993. b. PCI to LAD  05/1997. c. recurrent inferolateral MI complicated by V. fib arrest in 04/1998, prior PCI to OM1. d. 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  [name] Chronic combined systolic and diastolic CHF (congestive heart failure) ([place])   . Complication of anesthesia    -years ago hair fell out.;ileus after 2 of his surgeries  . Dry skin   . Foot drop, bilateral   . Hypercholesteremia   . Hypertension   . Ileus ([place])    After AAA  . Incisional hernia    "small, from AAA"  . Ischemic cardiomyopathy   . MI (myocardial infarction) ([place])   . 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 OSA on CPAP    uses CPAP--sleep study done at least 35yrs ago  . PAF (paroxysmal atrial fibrillation) ([place])   . Pain    back pain chronic- seen at pain clinic  . Plantar fasciitis    bilatetral  . Thrombocytopenia ([place])   . Type II diabetes mellitus [facility])     Patient Active Problem List   Diagnosis Date Noted  . On amiodarone therapy 08/26/2017  . Near syncope 06/11/2017  . Hypoxia   . Pneumonia of left lower lobe due to infectious organism ([place])   . Gastroenteritis and colitis, viral 04/27/2016  . Orthostasis 04/26/2016  . Nausea vomiting and diarrhea 04/26/2016  . Lumbar radiculopathy, chronic 01/16/2016  . Hematoma  of left kidney 11/01/2015  . Hyperlipidemia 12/12/2014  . SIRS (systemic inflammatory response syndrome) ([place]) 12/12/2014  . Influenza due to identified novel influenza A virus with other respiratory manifestations 11/07/2013  . Long term current use of anticoagulant therapy 09/08/2013    Class: Chronic  . Coronary atherosclerosis of native coronary artery 08/16/2013    Class: Chronic  . Chronic combined systolic and diastolic heart failure (HCC) 08/16/2013    Class: Chronic  . Paroxysmal atrial fibrillation with RVR ([place]) 08/13/2013  . Paralytic ileus ([place]) 05/07/2012  . Nausea and vomiting 01/01/2012  . DM (diabetes mellitus), type 2, uncontrolled w/neurologic complication ([place]) 82/42/3536  . Hypertension   .  AAA (abdominal aortic aneurysm) ([place])   . Acid reflux   . Sleep apnea     Past Surgical History:  Procedure Laterality Date  . ABDOMINAL AORTIC ANEURYSM REPAIR    . BACK SURGERY  2012-2013 X 3   Miminal Invasive x 3in [place].  [name] CARDIAC CATHETERIZATION  2009/2012  . CARDIOVERSION N/A 04/12/2015   Procedure: CARDIOVERSION;  Surgeon: Sueanne Margarita, MD;  Location: [place];  Service: Cardiovascular;  Laterality: N/A;  . CARDIOVERSION N/A 12/26/2016   Procedure: CARDIOVERSION;  Surgeon: Sueanne Margarita, MD;  Location: [facility];  Service: Cardiovascular;  Laterality: N/A;  . COLONOSCOPY    . CORONARY ANGIOPLASTY WITH STENT PLACEMENT  1992; 1997  . LAPAROSCOPIC CHOLECYSTECTOMY    . LUMBAR LAMINECTOMY/DECOMPRESSION MICRODISCECTOMY Right 01/16/2016   Procedure: Right Lumbar five-Sacral one Laminectomy;  Surgeon: Kristeen Miss, MD;  Location: [place] [facility];  Service: Neurosurgery;  Laterality: Right;  Right L5-S1 Laminectomy        Home Medications    Prior to Admission medications   Medication Sig Start Date End Date Taking? Authorizing Provider  amiodarone (PACERONE) 200 MG tablet Take 1 tablet (200 mg total) by mouth daily. 02/27/17   Belva Crome, MD  amitriptyline (ELAVIL) 25 MG tablet Take 75 mg by mouth at bedtime.  02/19/17   [provider]  atorvastatin (LIPITOR) 10 MG tablet Take 10 mg by mouth daily.    [provider]  Coenzyme Q10 (CO Q 10) 100 MG CAPS Take 100 mg by mouth daily.    [provider]  diazepam (VALIUM) 5 MG tablet Take 5 mg by mouth at bedtime as needed (leg cramps/sleep).  10/31/15   [provider]  empagliflozin (JARDIANCE) 10 MG TABS tablet Take 10 mg by mouth daily with breakfast.     [provider]  finasteride (PROPECIA) 1 MG tablet Take 1 mg by mouth daily as needed (for hair loss).    [provider]  HYDROcodone-acetaminophen (NORCO/VICODIN) 5-325 MG tablet Take 1 tablet by mouth every 4 (four)  hours as needed. 10/19/17   Isla Pence, MD  ibuprofen (ADVIL,MOTRIN) 600 MG tablet Take 1 tablet (600 mg total) by mouth every 6 (six) hours as needed. 10/19/17   Isla Pence, MD  [facility] EX) Apply 1 application topically every 4 (four) hours as needed (for nerve pain). FORMULATION WITH LIDOCAINE    [provider]  metFORMIN (GLUCOPHAGE-XR) 500 MG 24 hr tablet Take 500-1,000 mg by mouth See admin instructions. 1,000 mg in the morning and 500 mg in the evening 10/12/15   [provider]  metoprolol tartrate (LOPRESSOR) 50 MG tablet Take 50 mg by mouth 2 (two) times daily.    [provider]  Multiple Vitamins-Minerals (ONE-A-DAY MENS 50+ ADVANTAGE) TABS Take 1 tablet by mouth daily.  [provider]  omeprazole (PRILOSEC) 20 MG capsule Take 20 mg by mouth daily. 09/14/12   [provider]  oxyCODONE-acetaminophen (PERCOCET/ROXICET) 5-325 MG tablet Take 1 tablet by mouth every 6 (six) hours as needed (pain).     [provider]  ramipril (ALTACE) 10 MG capsule Take 10 mg by mouth daily.     [provider]  rivaroxaban (XARELTO) 20 MG TABS tablet Take 20 mg by mouth daily with supper.    [provider]  sildenafil (REVATIO) 20 MG tablet Take 20-100 mg by mouth daily as needed (for ED).    [provider]  vitamin B-12 (CYANOCOBALAMIN) 1000 MCG tablet Take 1,000 mcg by mouth daily.     [provider]  promethazine (PHENERGAN) 25 MG tablet Take 25 mg by mouth every 6 (six) hours as needed.  01/02/12  [provider]    Family History Family History  Problem Relation Age of Onset  . Diabetes Mother   . Coronary artery disease Mother   . Diabetes Father   . Arthritis Father   . Rheum arthritis Father   . Coronary artery disease Father     Social History Social History   Tobacco Use  . Smoking status: Former Smoker    Packs/day: 1.00    Years: 33.00    Pack years: 33.00     Types: Cigarettes    Last attempt to quit: 01/01/1992    Years since quitting: 26.1  . Smokeless tobacco: Never Used  Substance Use Topics  . Alcohol use: Yes    Comment: 6/30;2017 "GLASS OF WINE Q COUPLE MONTHS, IF THAT"  . Drug use: No     Allergies   Cymbalta [duloxetine hcl]; Neurontin [gabapentin]; Lyrica [pregabalin]; and Keppra [levetiracetam]   Review of Systems Review of Systems  Constitutional: Positive for fatigue. Negative for chills and fever.  HENT: Negative for congestion and facial swelling.   Eyes: Negative for discharge and visual disturbance.  Respiratory: Negative for shortness of breath.   Cardiovascular: Positive for chest pain. Negative for palpitations.  Gastrointestinal: Negative for abdominal pain, diarrhea and vomiting.  Musculoskeletal: Negative for arthralgias and myalgias.  Skin: Negative for color change and rash.  Neurological: Negative for tremors, syncope and headaches.  Psychiatric/Behavioral: Negative for confusion and dysphoric mood.     Physical Exam Updated Vital Signs BP 112/70 (BP Location: Right Arm)   Pulse 63   Temp 98.5 F (36.9 C) (Oral)   Resp 18   SpO2 98%   Physical Exam  Constitutional: He is oriented to person, place, and time. He appears well-developed and well-nourished.  HENT:  Head: Normocephalic and atraumatic.  Eyes: Pupils are equal, round, and reactive to light. EOM are normal.  Neck: Normal range of motion. Neck supple. No JVD present.  Cardiovascular: Normal rate and regular rhythm. Exam reveals no gallop and no friction rub.  No murmur heard. Pulmonary/Chest: No respiratory distress. He has no wheezes. He exhibits no tenderness.  Abdominal: He exhibits no distension and no mass. There is no tenderness. There is no rebound and no guarding.  Musculoskeletal: Normal range of motion.  Neurological: He is alert and oriented to person, place, and time.  Skin: No rash noted. No pallor.  Psychiatric: He has a  normal mood and affect. His behavior is normal.  Nursing note and vitals reviewed.    ED Treatments / Results  Labs (all labs ordered are listed, but only abnormal results are displayed) Labs Reviewed  BASIC  METABOLIC PANEL - Abnormal; Notable for the following components:      Result Value   Glucose, Bld 303 (*)    BUN 27 (*)    GFR calc non Af Amer 55 (*)    All other components within normal limits  CBC - Abnormal; Notable for the following components:   Platelets 136 (*)    All other components within normal limits  I-STAT TROPONIN, ED  I-STAT TROPONIN, ED    EKG EKG Interpretation  Date/Time:  Wednesday February 04 2018 14:24:19 EDT Ventricular Rate:  69 PR Interval:  200 QRS Duration: 102 QT Interval:  428 QTC Calculation: 458 R Axis:   -61 Text Interpretation:  Sinus rhythm with frequent Premature ventricular complexes Low voltage QRS Incomplete right bundle branch block Left anterior fascicular block Inferior infarct , age undetermined Cannot rule out Anterior infarct , age undetermined Abnormal ECG No significant change since last tracing Confirmed by Deno Etienne [phone]) on 02/04/2018 3:18:29 PM   Radiology Dg Chest 2 View  Result Date: 02/04/2018 CLINICAL DATA:  Shortness of breath, dizziness, and mild chest pain today. History of CHF, atrial fibrillation, coronary artery disease, former smoker. EXAM: CHEST - 2 VIEW COMPARISON:  Chest x-ray of June 11, 2017 FINDINGS: The lungs are mildly hyperinflated. The interstitial markings are minimally prominent but improved over the appearance on the previous study. The heart is normal in size. The pulmonary vascularity is normal. There is calcification in the wall of the thoracic aorta. There is no pleural effusion. The bony thorax exhibits no acute abnormality. IMPRESSION: No CHF or pneumonia. Mild chronic bronchitic-smoking related changes. Thoracic aortic atherosclerosis. Electronically Signed   By: David  Martinique M.D.   On:  02/04/2018 15:27    Procedures Procedures (including critical care time)  Medications Ordered in ED Medications  nitroGLYCERIN (NITROSTAT) SL tablet 0.4 mg (has no administration in time range)  aspirin chewable tablet 162 mg (162 mg Oral Given 02/04/18 1620)     Initial Impression / Assessment and Plan / ED Course  I have reviewed the triage vital signs and the nursing notes.  Pertinent labs & imaging results that were available during my care of the patient were reviewed by me and considered in my medical decision making (see chart for details).     77 yo  M with a chief complaint of chest pain.  This feels like a pressure has been going on for the past couple days.  Associated with fatigue.  The patient has had long periods of time where he has not felt like getting up moving around.  Denies infectious symptoms.  Patient has had an MI previously, initial troponins negative EKG is unremarkable.  Will have cardiology evaluate.  Eval by cards, feel unlikely cardiac.  Delta trop negative. D/c home.   6:29 PM:  I have discussed the diagnosis/risks/treatment options with the patient and family and believe the pt to be eligible for discharge home to follow-up with PCP, cards. We also discussed returning to the ED immediately if new or worsening sx occur. We discussed the sx which are most concerning (e.g., sudden worsening pain, fever, inability to tolerate by mouth) that necessitate immediate return. Medications administered to the patient during their visit and any new prescriptions provided to the patient are listed below.  Medications given during this visit Medications  nitroGLYCERIN (NITROSTAT) SL tablet 0.4 mg (has no administration in time range)  aspirin chewable tablet 162 mg (162 mg Oral Given 02/04/18 1620)  The patient appears reasonably screen and/or stabilized for discharge and I doubt any other medical condition or other [facility] requiring further screening, evaluation, or  treatment in the ED at this time prior to discharge.    Final Clinical Impressions(s) / ED Diagnoses   Final diagnoses:  Nonspecific chest pain    ED Discharge Orders    None       Deno Etienne, DO 02/04/18 1830

## 2018-02-04 NOTE — Consult Note (Addendum)
The patient has been seen in conjunction with Kristopher Sharp, PA-C. All aspects of care have been considered and discussed. The patient has been personally interviewed, examined, and all clinical data has been reviewed.   Several day history of lethargy and hypersomnolence.  Has not been able to sleep well because of lower extremity pain.  This morning after awakening from a 12-hour sleep he felt tightness as if her belt had been placed around the subxiphoid and subcostal area and mid abdominal region.  No nausea, vomiting, diarrhea, or other complaints.  No real chest discomfort.  No dyspnea.  He has remote history of coronary artery disease with prior infarction, ischemic left ventricular dysfunction, and CKD.  Exam is unremarkable with the exception of abdominal tympany.  There is no tenderness or guarding.  Cardiac exam is unremarkable.  Neck veins are flat.  EKG is not acutely different than baseline showing evidence of prior inferior infarction with isolated PVCs.  Troponin and all other laboratory data are normal.  Chest x-ray does not reveal evidence of congestion.  Current presentation is not related to cardiac disease.  Perhaps he is developing an early intra-abdominal process.  No further workup is needed from cardiac standpoint.  Cardiology Consult:   Patient ID: [name]; MRN: [id number]; DOB: Sep 30, 1941   Admission date: 02/04/2018  Primary Care Provider: [name], MD Primary Cardiologist: Kristopher Grooms, MD  Primary Electrophysiologist:    Chief Complaint:  Chest pain  Patient Profile:   [name] is a 77 y.o. male with a history of DM2, paroxysmal Afib, OSA on CPAP, ischemic cardiomyopathy, chronic systolic and diastolic heart failure, HTN, and AAA s/p repair 2009. He is being seen today for chest pain at the request of [name].  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  He has a history of paroxysmal Afib on amiodarone and lopressor, and is anticoagulated with xarelto. He underwent DCCV with successful conversion to NSR on 04/12/15 and again on 12/26/2016.   [name] last saw [name] in clinic on 08/26/17. At that time, he was doing well, no bleeding or volume overload. No medication changes made.  [name] generally exercises daily with walking and riding a recumbent bike.   He reports to [facility] after 3 days of extreme fatigue, increased sleep, and new onset chest pressure. He slept all day yesterday, woke up at 10pm, went back to sleep at midnight and slept until noon today. When he woke up, he felt chest pressure in the center of his chest rated as a 4/10. This pressure does not feel like his prior MI's but he states "something is happening to cause this fatigue and lack of energy" associated with the chest pressure. The chest pressure has been persistent since noon. He does not feel like he usually does when he is in Afib. He denies recent illness, problems urinating, abnormal bleeding, fevers/chills, and recent sick contacts. He denies nausea, vomiting, and diarrhea.   On arrival, initial troponin was negative. He has been in sinus rhythm. AKI with sCr of 1.22, likely related to poor PO intake because he's been sleeping excessively. Recommend gentle PO hydration. CXR without signs of infection or congestive heart failure.   Past Medical History:  Diagnosis Date  . AAA (abdominal  aortic aneurysm) ([place])    a. s/p repair 2009.  [name] Acid reflux    takes Prilosec and Protonix daily  . Arthritis    BacK  . CAD in native artery    a. a. prior inf MI 1993. b. PCI to LAD 05/1997. c. recurrent inferolateral MI complicated by V. fib arrest in 04/1998, prior PCI to OM1. d. 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  [name] Chronic combined systolic and diastolic CHF (congestive heart  failure) ([place])   . Complication of anesthesia    -years ago hair fell out.;ileus after 2 of his surgeries  . Dry skin   . Foot drop, bilateral   . Hypercholesteremia   . Hypertension   . Ileus ([place])    After AAA  . Incisional hernia    "small, from AAA"  . Ischemic cardiomyopathy   . MI (myocardial infarction) ([place])   . 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 OSA on CPAP    uses CPAP--sleep study done at least 23yrs ago  . PAF (paroxysmal atrial fibrillation) ([place])   . Pain    back pain chronic- seen at pain clinic  . Plantar fasciitis    bilatetral  . Thrombocytopenia ([place])   . Type II diabetes mellitus ([place])     Past Surgical History:  Procedure Laterality Date  . ABDOMINAL AORTIC ANEURYSM REPAIR    . BACK SURGERY  2012-2013 X 3   Miminal Invasive x 3in [place].  [name] CARDIAC CATHETERIZATION  2009/2012  . CARDIOVERSION N/A 04/12/2015   Procedure: CARDIOVERSION;  Surgeon: Kristopher Margarita, MD;  Location: [facility];  Service: Cardiovascular;  Laterality: N/A;  . CARDIOVERSION N/A 12/26/2016   Procedure: CARDIOVERSION;  Surgeon: Kristopher Margarita, MD;  Location: [facility];  Service: Cardiovascular;  Laterality: N/A;  . COLONOSCOPY    . CORONARY ANGIOPLASTY WITH STENT PLACEMENT  1992; 1997  . LAPAROSCOPIC CHOLECYSTECTOMY    . LUMBAR LAMINECTOMY/DECOMPRESSION MICRODISCECTOMY Right 01/16/2016   Procedure: Right Lumbar five-Sacral one Laminectomy;  Surgeon: Kristopher Miss, MD;  Location: [facility];  Service: Neurosurgery;  Laterality: Right;  Right L5-S1 Laminectomy     Medications Prior to Admission: Prior to Admission medications   Medication Sig Start Date End Date Taking? Authorizing Provider  amiodarone (PACERONE) 200 MG tablet Take 1 tablet (200 mg total) by mouth daily. 02/27/17   Kristopher Crome, MD  amitriptyline (ELAVIL) 25 MG tablet Take 75 mg by mouth at bedtime.  02/19/17   [provider]  atorvastatin (LIPITOR) 10 MG tablet Take 10 mg by mouth daily.    [provider]  Coenzyme Q10 (CO Q 10) 100 MG CAPS Take 100 mg by mouth daily.    [provider]  diazepam (VALIUM) 5 MG tablet Take 5 mg by mouth at bedtime as needed (leg cramps/sleep).  10/31/15   [provider]  empagliflozin (JARDIANCE) 10 MG TABS tablet Take 10 mg by mouth daily with breakfast.     [provider]  finasteride (PROPECIA) 1 MG tablet Take 1 mg by mouth daily as needed (for hair loss).    [provider]  HYDROcodone-acetaminophen (NORCO/VICODIN) 5-325 MG tablet Take 1 tablet by mouth every 4 (four) hours as needed. 10/19/17   Isla Pence, MD  ibuprofen (ADVIL,MOTRIN) 600 MG tablet Take 1 tablet (600 mg total) by mouth every 6 (six) hours as needed. 10/19/17   Isla Pence, MD  [facility] EX) Apply 1 application topically every 4 (  four) hours as needed (for nerve pain). FORMULATION WITH LIDOCAINE    [provider]  metFORMIN (GLUCOPHAGE-XR) 500 MG 24 hr tablet Take 500-1,000 mg by mouth See admin instructions. 1,000 mg in the morning and 500 mg in the evening 10/12/15   [provider]  metoprolol tartrate (LOPRESSOR) 50 MG tablet Take 50 mg by mouth 2 (two) times daily.    [provider]  Multiple Vitamins-Minerals (ONE-A-DAY MENS 50+ ADVANTAGE) TABS Take 1 tablet by mouth daily.    [provider]  omeprazole (PRILOSEC) 20 MG capsule Take 20 mg by mouth daily. 09/14/12   [provider]  oxyCODONE-acetaminophen (PERCOCET/ROXICET) 5-325 MG tablet Take 1 tablet by mouth every 6 (six) hours as needed (pain).     [provider]  ramipril (ALTACE) 10 MG capsule Take 10 mg by mouth daily.     [provider]  rivaroxaban (XARELTO) 20 MG TABS tablet Take 20 mg by mouth daily with supper.    [provider]  sildenafil (REVATIO) 20 MG tablet Take 20-100 mg by mouth daily as needed (for ED).    [provider]  vitamin B-12 (CYANOCOBALAMIN) 1000 MCG  tablet Take 1,000 mcg by mouth daily.     [provider]  promethazine (PHENERGAN) 25 MG tablet Take 25 mg by mouth every 6 (six) hours as needed.  01/02/12  [provider]     Allergies:    Allergies  Allergen Reactions  . Cymbalta [Duloxetine Hcl] Nausea And Vomiting and Other (See Comments)    PATIENT STATED THAT HE FELT LIKE HE "WAS GOING TO DIE" Rapid drop in blood pressure; sent him to the ER X2  . Neurontin [Gabapentin] Other (See Comments)    "Makes me goofy, keeps me off balance, clouds my thinking.."  . Lyrica [Pregabalin] Other (See Comments)    "Makes me goofy, keeps me off balance, clouds my thinking..."  . Keppra [Levetiracetam] Nausea And Vomiting    Social History:   Social History   Socioeconomic History  . Marital status: Married    Spouse name: Not on file  . Number of children: Not on file  . Years of education: Not on file  . Highest education level: Not on file  Occupational History  . Not on file  Social Needs  . Financial resource strain: Not on file  . Food insecurity:    Worry: Not on file    Inability: Not on file  . Transportation needs:    Medical: Not on file    Non-medical: Not on file  Tobacco Use  . Smoking status: Former Smoker    Packs/day: 1.00    Years: 33.00    Pack years: 33.00    Types: Cigarettes    Last attempt to quit: 01/01/1992    Years since quitting: 26.1  . Smokeless tobacco: Never Used  Substance and Sexual Activity  . Alcohol use: Yes    Comment: 6/30;2017 "GLASS OF WINE Q COUPLE MONTHS, IF THAT"  . Drug use: No  . Sexual activity: Yes  Lifestyle  . Physical activity:    Days per week: Not on file    Minutes per session: Not on file  . Stress: Not on file  Relationships  . Social connections:    Talks on phone: Not on file    Gets together: Not on file    Attends religious service: Not on file    Active member of club or organization: Not on file  Attends meetings of clubs or organizations:  Not on file    Relationship status: Not on file  . Intimate partner violence:    Fear of current or ex partner: Not on file    Emotionally abused: Not on file    Physically abused: Not on file    Forced sexual activity: Not on file  Other Topics Concern  . Not on file  Social History Narrative  . Not on file    Family History:   The patient's family history includes Arthritis in his father; Coronary artery disease in his father and mother; Diabetes in his father and mother; Rheum arthritis in his father.    ROS:  Please see the history of present illness.  All other ROS reviewed and negative.     Physical Exam/Data:   Vitals:   02/04/18 1418 02/04/18 1530  BP: 115/61 (!) 111/55  Pulse: 73 62  Resp: 16 14  Temp: 98.5 F (36.9 C)   TempSrc: Oral   SpO2: 97% 97%   No intake or output data in the 24 hours ending 02/04/18 1606 There were no vitals filed for this visit. There is no height or weight on file to calculate BMI.  General:  Well nourished, well developed, in no acute distress HEENT: normal Neck: no JVD Vascular: No carotid bruits  Cardiac:  normal S1, S2; RRR; no murmur Lungs:  clear to auscultation bilaterally, no wheezing, rhonchi or rales  Abd:  mildly tender, no hepatomegaly  Ext: no edema Musculoskeletal:  No deformities, BUE and BLE strength normal and equal Skin: warm and dry  Neuro:  CNs 2-12 intact, no focal abnormalities noted Psych:  Normal affect    EKG:  The ECG that was done, was personally reviewed and demonstrates sinus rhythm, incomplete RBBB  Relevant CV Studies:  Echo 06/12/17: Study Conclusions - Left ventricle: The cavity size was mildly dilated. There was   mild concentric hypertrophy. Systolic function was mildly to   moderately reduced. The estimated ejection fraction was in the   range of 40% to 45%. Mild diffuse hypokinesis with regional   variations. Doppler parameters are consistent with abnormal left   ventricular relaxation  (grade 1 diastolic dysfunction). - Aortic valve: There was trivial regurgitation. - Left atrium: The atrium was mildly dilated.  Laboratory Data:  Chemistry Recent Labs  Lab 02/04/18 1431  NA 137  K 4.9  CL 101  CO2 23  GLUCOSE 303*  BUN 27*  CREATININE 1.22  CALCIUM 9.4  GFRNONAA 55*  GFRAA >60  ANIONGAP 13    No results for input(s): PROT, ALBUMIN, AST, ALT, ALKPHOS, BILITOT in the last 168 hours. Hematology Recent Labs  Lab 02/04/18 1431  WBC 5.5  RBC 5.12  HGB 15.7  HCT 47.2  MCV 92.2  MCH 30.7  MCHC 33.3  RDW 14.3  PLT 136*   Cardiac EnzymesNo results for input(s): TROPONINI in the last 168 hours.  Recent Labs  Lab 02/04/18 1439  TROPIPOC 0.00    BNPNo results for input(s): BNP, PROBNP in the last 168 hours.  DDimer No results for input(s): DDIMER in the last 168 hours.  Radiology/Studies:  Dg Chest 2 View  Result Date: 02/04/2018 CLINICAL DATA:  Shortness of breath, dizziness, and mild chest pain today. History of CHF, atrial fibrillation, coronary artery disease, former smoker. EXAM: CHEST - 2 VIEW COMPARISON:  Chest x-ray of June 11, 2017 FINDINGS: The lungs are mildly hyperinflated. The interstitial markings are minimally prominent but improved over  the appearance on the previous study. The heart is normal in size. The pulmonary vascularity is normal. There is calcification in the wall of the thoracic aorta. There is no pleural effusion. The bony thorax exhibits no acute abnormality. IMPRESSION: No CHF or pneumonia. Mild chronic bronchitic-smoking related changes. Thoracic aortic atherosclerosis. Electronically Signed   By: David  Martinique M.D.   On: 02/04/2018 15:27    Assessment and Plan:   1. Chest pain, known CAD - initial POC troponin negative (1439) - EKG largely unchanged from prior Pt describes constant central chest pressure that is nonradiating that has been constant since noon today. However, upon further questioning, it sounds as though  his pain is coming from his upper abdomen. Would obtain a KUB and delta troponin.    2. Extreme fatigue No signs/symptoms of infection. No pain except central chest pressure. Unclear etiology. CXR without PNA or signs of CHF. NO leukocytosis. Recommend obtaining UA.    3. Atrial fibrillation EKG in sinus rhythm. Continue amiodarone, lopressor, and xarelot. This patients CHA2DS2-VASc Score and unadjusted Ischemic Stroke Rate (% per year) is equal to 7.2 % stroke rate/year from a score of 5 (CHF, HTN, DM, MI, age)   39. Chronic systolic and diastolic heart failure Pt is euvolemic on exam. Continue home medications.    If KUB, UA, and delta troponin are negative, ok to discharge from ED.   Severity of Illness: The appropriate patient status for this patient is INPATIENT. Inpatient status is judged to be reasonable and necessary in order to provide the required intensity of service to ensure the patient's safety. The patient's presenting symptoms, physical exam findings, and initial radiographic and laboratory data in the context of their chronic comorbidities is felt to place them at high risk for further clinical deterioration. Furthermore, it is not anticipated that the patient will be medically stable for discharge from the hospital within 2 midnights of admission. The following factors support the patient status of inpatient.   " The patient's presenting symptoms include chest pain. " The worrisome physical exam findings include none  " The initial radiographic and laboratory data are worrisome because of abnormal ekg. " The chronic co-morbidities include DM, HTN, CHF.   * I certify that at the point of admission it is my clinical judgment that the patient will require inpatient hospital care spanning beyond 2 midnights from the point of admission due to high intensity of service, high risk for further deterioration and high frequency of surveillance required.*    For questions or  updates, please contact  Please consult [email or web address] for contact info under Cardiology/STEMI.    Signed, Tami Lin Duke, PA  02/04/2018 4:06 PM

## 2018-03-04 ENCOUNTER — Other Ambulatory Visit: Payer: Self-pay | Admitting: Interventional Cardiology

## 2018-03-27 ENCOUNTER — Other Ambulatory Visit: Payer: Self-pay | Admitting: Interventional Cardiology

## 2018-04-04 IMAGING — US US CHEST/MEDIASTINUM
1 series · 14 of 16 positions shown · non-contrast
Comparison: 06/11/2017

CLINICAL DATA: Evaluate chest wall mass.

EXAM:
CHEST ULTRASOUND

[Series 1: us chest/mediastinum · 0.05mm/px · 24 acquisitions, 14 frames shown]
[im 1/24]
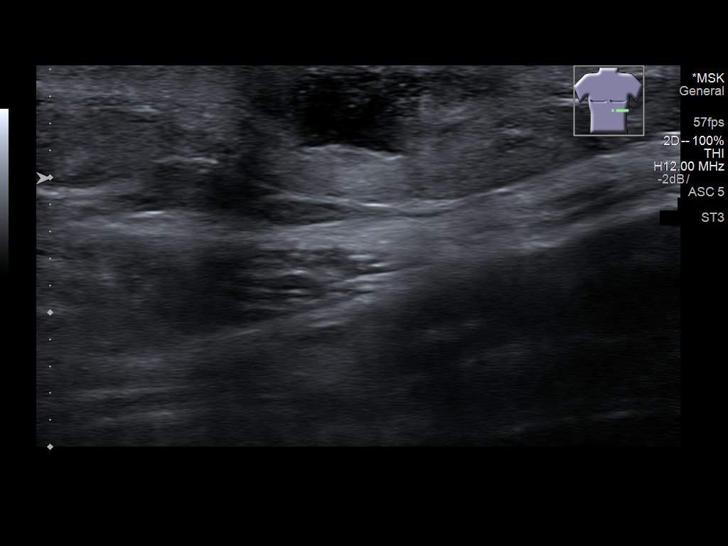
[im 2/24]
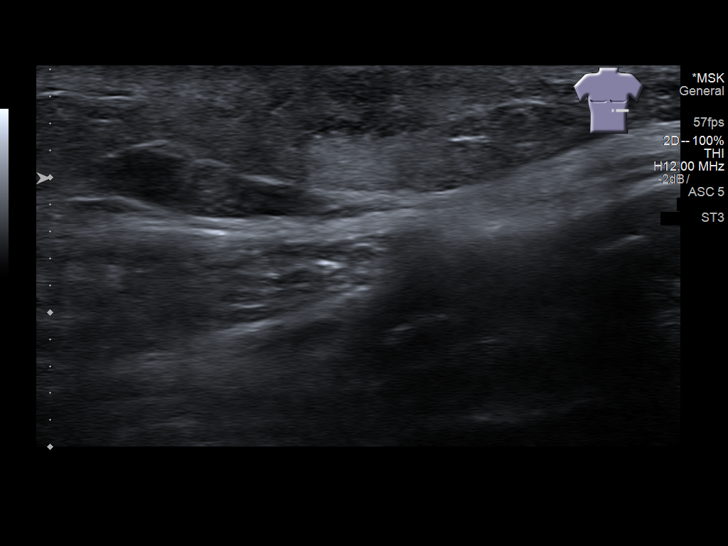
[im 4/24]
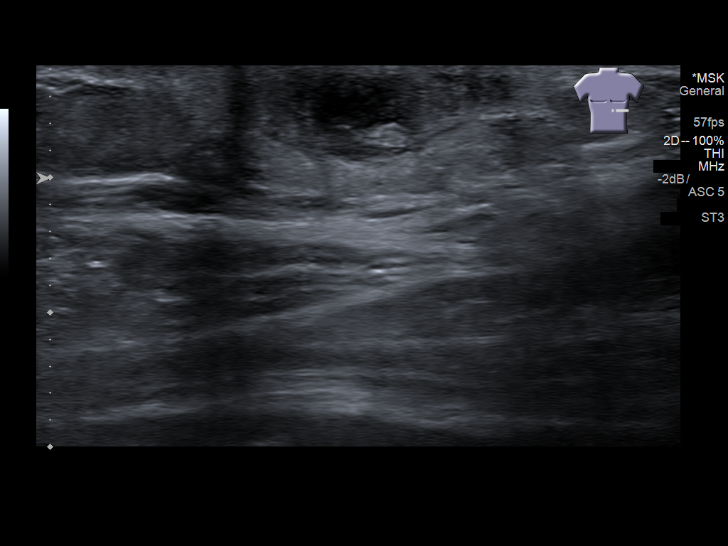
[im 7/24]
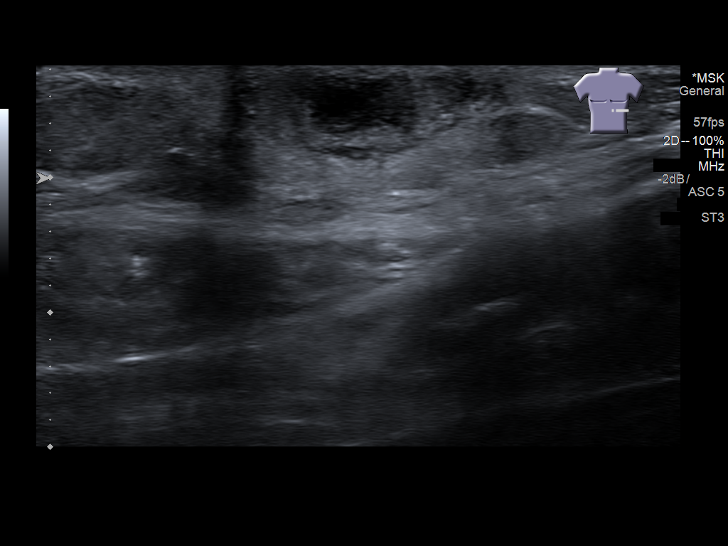
[im 8/24]
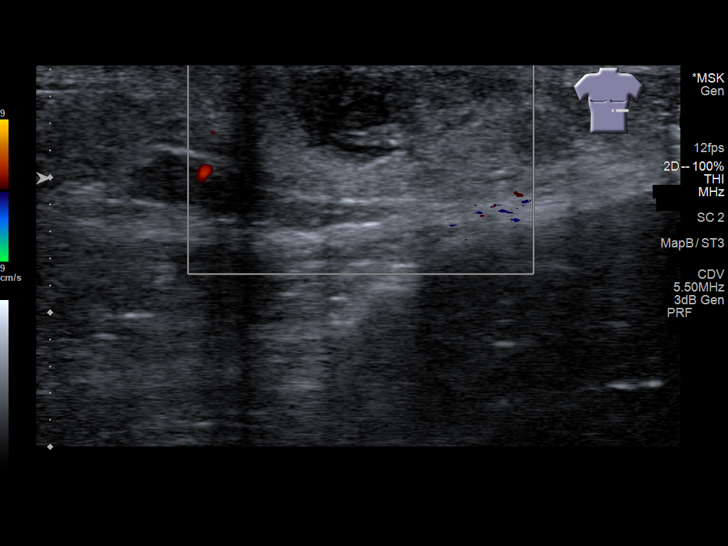
[im 10/24]
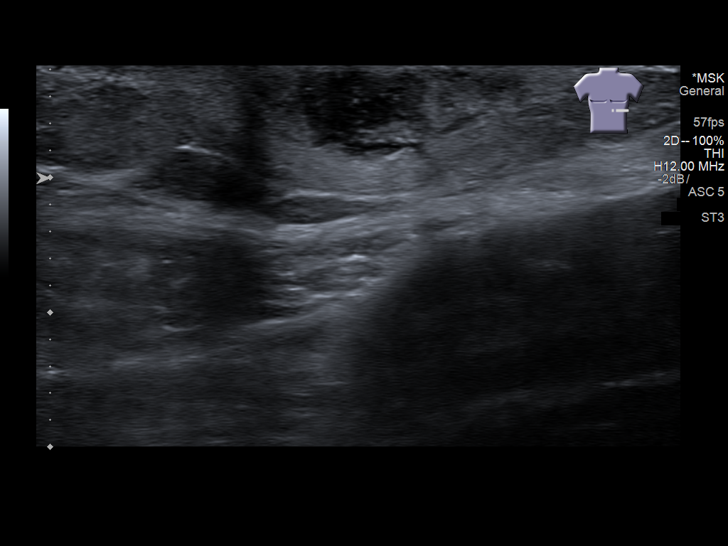
[im 11/24]
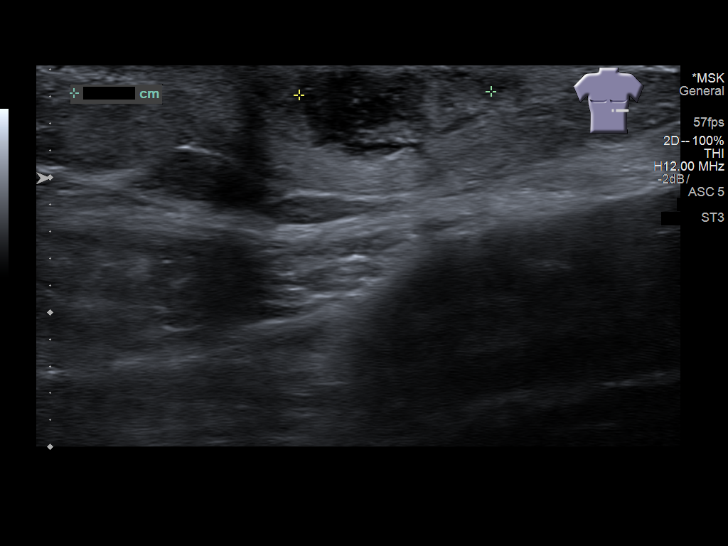
[im 13/24]
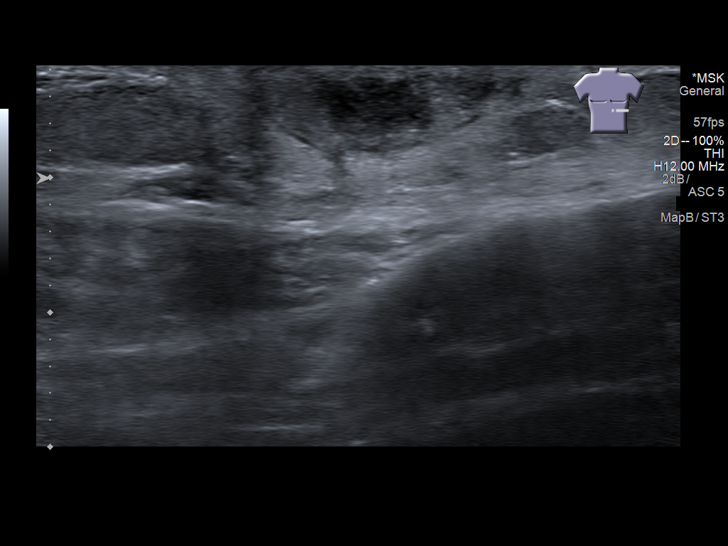
[im 14/24]
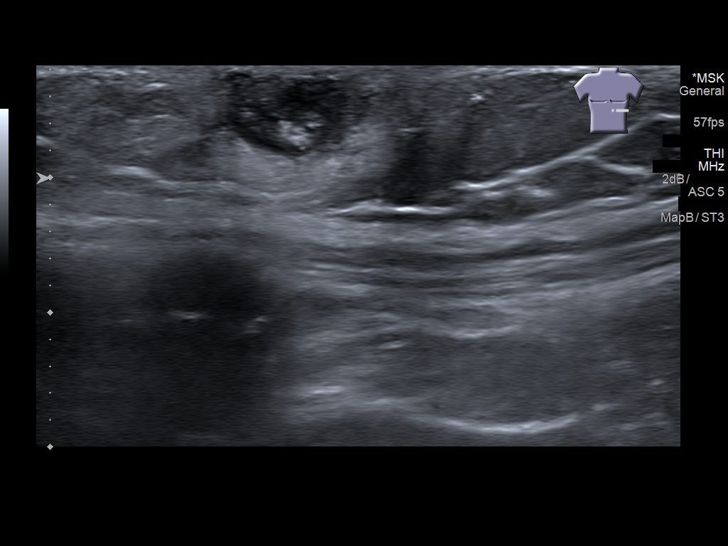
[im 16/24]
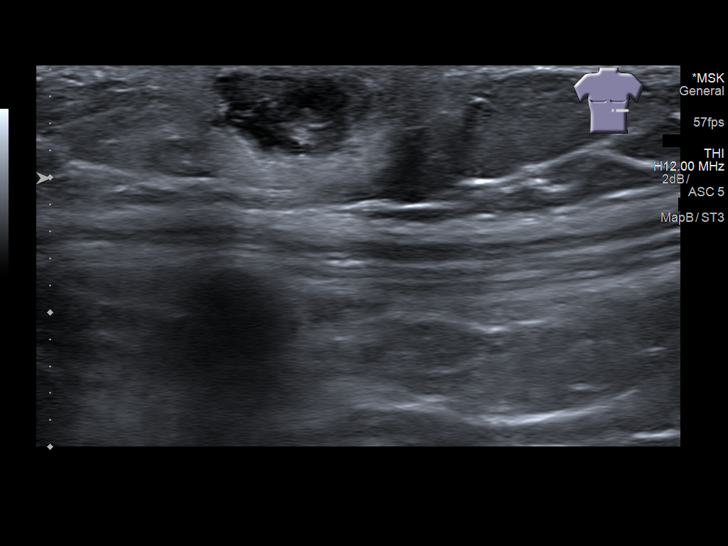
[im 19/24]
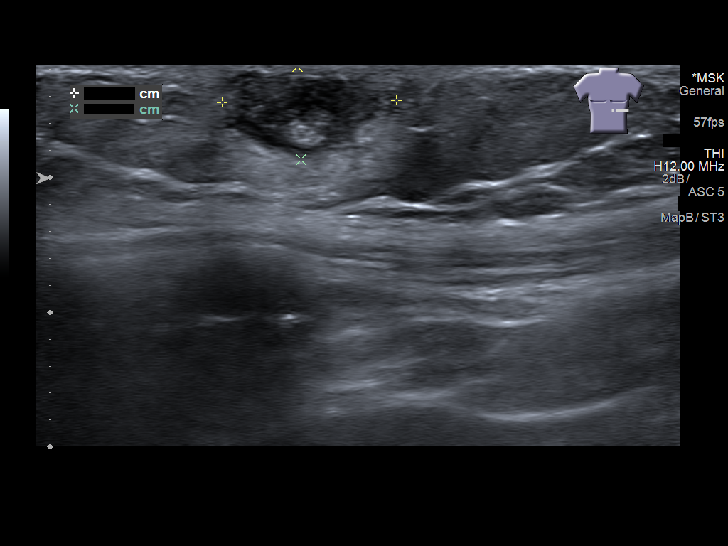
[im 20/24]
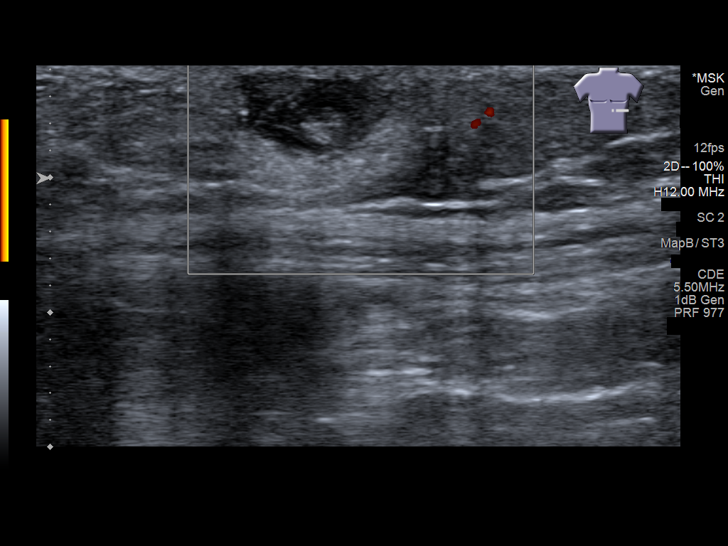
[im 22/24]
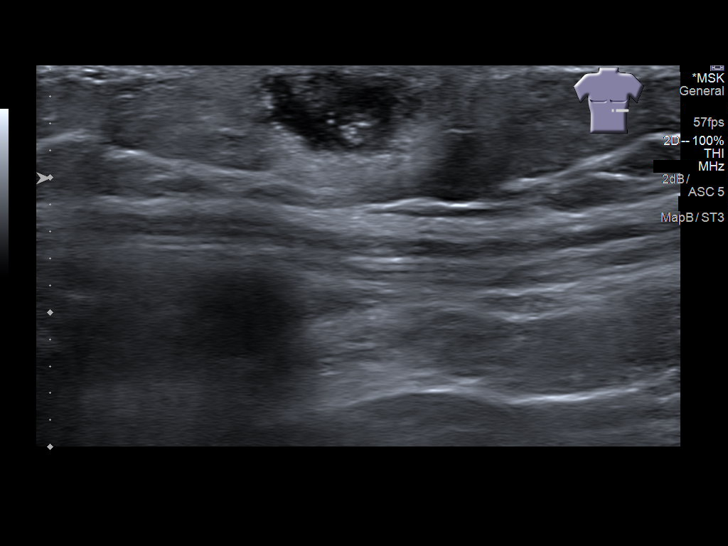
[im 24/24]
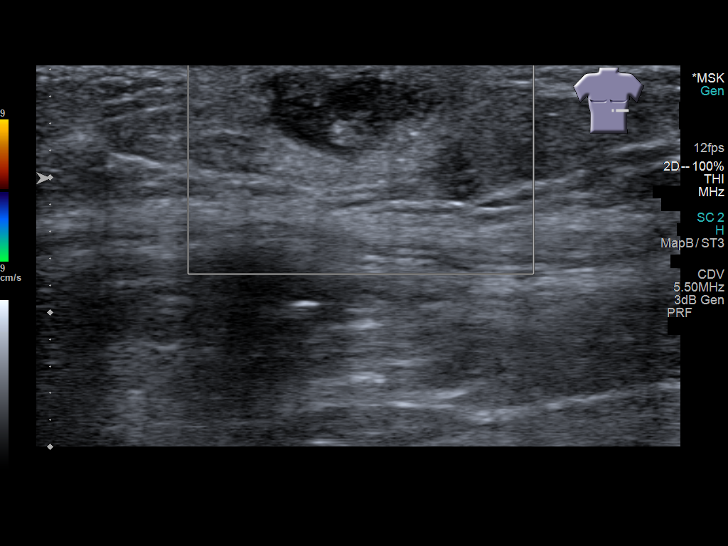

[14 of 16 positions shown; findings below may reference images not displayed]

FINDINGS: Limited, focused ultrasound was performed of the left ventral chest
wall over the area of concern along the left lower chest.

Mass:  None

Complex cyst: Yes. There is a hypoechoic complex structure
corresponding to the area of concern which measures 1.3 x 0.7 x
cm. No internal blood flow identified.
IMPRESSION: 1. Complex cystic structure corresponds to the area of concern over
the left lower chest. This may represent a subcutaneous abscess
and/or infected sebaceous cyst. Hematoma could also have this
appearance. Careful clinical correlation is advised. If this does
not resolve after appropriate therapy then further imaging with
contrast enhanced chest wall CT or MRI with possible needle
aspiration would be advised to rule out underlying chest wall
lesion.

## 2018-04-28 ENCOUNTER — Other Ambulatory Visit: Payer: Self-pay | Admitting: Internal Medicine

## 2018-04-28 DIAGNOSIS — R140 Abdominal distension (gaseous): Secondary | ICD-10-CM

## 2018-04-29 ENCOUNTER — Ambulatory Visit
Admission: RE | Admit: 2018-04-29 | Discharge: 2018-04-29 | Disposition: A | Discharge status: Home / Self Care | Payer: Medicare Other | Source: Ambulatory Visit | Attending: Internal Medicine | Admitting: Internal Medicine

## 2018-04-29 DIAGNOSIS — R140 Abdominal distension (gaseous): Secondary | ICD-10-CM

## 2018-04-29 MED ORDER — IOHEXOL 300 MG/ML  SOLN
100.0000 mL | Freq: Once | INTRAMUSCULAR | Status: AC | PRN
  Administered 2018-04-29: 100 mL via INTRAVENOUS

## 2018-05-08 ENCOUNTER — Emergency Department (HOSPITAL_COMMUNITY): Payer: Medicare Other

## 2018-05-08 ENCOUNTER — Other Ambulatory Visit: Payer: Self-pay

## 2018-05-08 ENCOUNTER — Observation Stay (HOSPITAL_COMMUNITY)
Admission: EM | Admit: 2018-05-08 | Discharge: 2018-05-09 | Disposition: A | Discharge status: Home / Self Care | Payer: Medicare Other | Attending: Family Medicine | Admitting: Family Medicine

## 2018-05-08 ENCOUNTER — Encounter (HOSPITAL_COMMUNITY): Payer: Self-pay | Admitting: Emergency Medicine

## 2018-05-08 DIAGNOSIS — E1165 Type 2 diabetes mellitus with hyperglycemia: Secondary | ICD-10-CM | POA: Diagnosis present

## 2018-05-08 DIAGNOSIS — E1149 Type 2 diabetes mellitus with other diabetic neurological complication: Secondary | ICD-10-CM | POA: Diagnosis present

## 2018-05-08 DIAGNOSIS — Z7984 Long term (current) use of oral hypoglycemic drugs: Secondary | ICD-10-CM | POA: Diagnosis not present

## 2018-05-08 DIAGNOSIS — I959 Hypotension, unspecified: Secondary | ICD-10-CM | POA: Diagnosis present

## 2018-05-08 DIAGNOSIS — I5043 Acute on chronic combined systolic (congestive) and diastolic (congestive) heart failure: Secondary | ICD-10-CM | POA: Diagnosis not present

## 2018-05-08 DIAGNOSIS — E1140 Type 2 diabetes mellitus with diabetic neuropathy, unspecified: Secondary | ICD-10-CM | POA: Diagnosis present

## 2018-05-08 DIAGNOSIS — E860 Dehydration: Secondary | ICD-10-CM

## 2018-05-08 DIAGNOSIS — R42 Dizziness and giddiness: Secondary | ICD-10-CM

## 2018-05-08 DIAGNOSIS — I2510 Atherosclerotic heart disease of native coronary artery without angina pectoris: Secondary | ICD-10-CM | POA: Diagnosis not present

## 2018-05-08 DIAGNOSIS — IMO0002 Reserved for concepts with insufficient information to code with codable children: Secondary | ICD-10-CM | POA: Diagnosis present

## 2018-05-08 DIAGNOSIS — R55 Syncope and collapse: Principal | ICD-10-CM | POA: Diagnosis present

## 2018-05-08 DIAGNOSIS — I480 Paroxysmal atrial fibrillation: Secondary | ICD-10-CM | POA: Diagnosis present

## 2018-05-08 DIAGNOSIS — I110 Hypertensive heart disease with heart failure: Secondary | ICD-10-CM | POA: Diagnosis not present

## 2018-05-08 DIAGNOSIS — Z87891 Personal history of nicotine dependence: Secondary | ICD-10-CM | POA: Diagnosis not present

## 2018-05-08 DIAGNOSIS — E119 Type 2 diabetes mellitus without complications: Secondary | ICD-10-CM | POA: Diagnosis not present

## 2018-05-08 DIAGNOSIS — Z7901 Long term (current) use of anticoagulants: Secondary | ICD-10-CM | POA: Insufficient documentation

## 2018-05-08 DIAGNOSIS — Z79899 Other long term (current) drug therapy: Secondary | ICD-10-CM | POA: Insufficient documentation

## 2018-05-08 DIAGNOSIS — I5042 Chronic combined systolic (congestive) and diastolic (congestive) heart failure: Secondary | ICD-10-CM | POA: Diagnosis not present

## 2018-05-08 LAB — CBC WITH DIFFERENTIAL/PLATELET
Abs Immature Granulocytes: 0.1 10*3/uL (ref 0.0–0.1)
Basophils Absolute: 0 10*3/uL (ref 0.0–0.1)
Basophils Relative: 0 %
Eosinophils Absolute: 0 10*3/uL (ref 0.0–0.7)
Eosinophils Relative: 0 %
HCT: 47.1 % (ref 39.0–52.0)
Hemoglobin: 15.3 g/dL (ref 13.0–17.0)
Immature Granulocytes: 1 %
Lymphocytes Relative: 18 %
Lymphs Abs: 1.2 10*3/uL (ref 0.7–4.0)
MCH: 30.8 pg (ref 26.0–34.0)
MCHC: 32.5 g/dL (ref 30.0–36.0)
MCV: 94.8 fL (ref 78.0–100.0)
Monocytes Absolute: 0.9 10*3/uL (ref 0.1–1.0)
Monocytes Relative: 14 %
Neutro Abs: 4.6 10*3/uL (ref 1.7–7.7)
Neutrophils Relative %: 67 %
Platelets: 148 10*3/uL — ABNORMAL LOW (ref 150–400)
RBC: 4.97 MIL/uL (ref 4.22–5.81)
RDW: 14.2 % (ref 11.5–15.5)
WBC: 6.8 10*3/uL (ref 4.0–10.5)

## 2018-05-08 LAB — COMPREHENSIVE METABOLIC PANEL
ALT: 59 U/L — ABNORMAL HIGH (ref 0–44)
AST: 49 U/L — ABNORMAL HIGH (ref 15–41)
Albumin: 3.9 g/dL (ref 3.5–5.0)
Alkaline Phosphatase: 58 U/L (ref 38–126)
Anion gap: 10 (ref 5–15)
BUN: 28 mg/dL — ABNORMAL HIGH (ref 8–23)
CO2: 26 mmol/L (ref 22–32)
Calcium: 9.5 mg/dL (ref 8.9–10.3)
Chloride: 104 mmol/L (ref 98–111)
Creatinine, Ser: 1.53 mg/dL — ABNORMAL HIGH (ref 0.61–1.24)
GFR calc Af Amer: 48 mL/min — ABNORMAL LOW (ref >60)
GFR calc non Af Amer: 42 mL/min — ABNORMAL LOW (ref >60)
Glucose, Bld: 212 mg/dL — ABNORMAL HIGH (ref 70–99)
Potassium: 4.4 mmol/L (ref 3.5–5.1)
Sodium: 140 mmol/L (ref 135–145)
Total Bilirubin: 1 mg/dL (ref 0.3–1.2)
Total Protein: 6.7 g/dL (ref 6.5–8.1)

## 2018-05-08 LAB — TROPONIN I
Troponin I: <0.03 ng/mL (ref <0.03)
Troponin I: <0.03 ng/mL (ref <0.03)

## 2018-05-08 LAB — BRAIN NATRIURETIC PEPTIDE: B Natriuretic Peptide: 135.7 pg/mL — ABNORMAL HIGH (ref 0.0–100.0)

## 2018-05-08 LAB — GLUCOSE, CAPILLARY: Glucose-Capillary: 152 mg/dL — ABNORMAL HIGH (ref 70–99)

## 2018-05-08 MED ORDER — METOPROLOL TARTRATE 25 MG PO TABS: 50.0000 mg | ORAL_TABLET | Freq: Two times a day (BID) | ORAL | Status: DC

## 2018-05-08 MED ORDER — VITAMIN B-12 1000 MCG PO TABS
1000.0000 ug | ORAL_TABLET | Freq: Every day | ORAL | Status: DC
  Administered 2018-05-09: 1000 ug via ORAL
  Filled 2018-05-08: qty 1

## 2018-05-08 MED ORDER — METOPROLOL TARTRATE 25 MG PO TABS
50.0000 mg | ORAL_TABLET | Freq: Two times a day (BID) | ORAL | Status: DC
  Administered 2018-05-09: 50 mg via ORAL
  Filled 2018-05-08: qty 2

## 2018-05-08 MED ORDER — METFORMIN HCL ER 500 MG PO TB24
1000.0000 mg | ORAL_TABLET | Freq: Every day | ORAL | Status: DC
  Administered 2018-05-09: 1000 mg via ORAL
  Filled 2018-05-08: qty 2

## 2018-05-08 MED ORDER — PANTOPRAZOLE SODIUM 40 MG PO TBEC
40.0000 mg | DELAYED_RELEASE_TABLET | Freq: Every day | ORAL | Status: DC
  Administered 2018-05-09: 40 mg via ORAL
  Filled 2018-05-08: qty 1

## 2018-05-08 MED ORDER — IOPAMIDOL (ISOVUE-370) INJECTION 76%
INTRAVENOUS | Status: AC
  Filled 2018-05-08: qty 100

## 2018-05-08 MED ORDER — ATORVASTATIN CALCIUM 20 MG PO TABS
10.0000 mg | ORAL_TABLET | Freq: Every day | ORAL | Status: DC
  Administered 2018-05-09: 10 mg via ORAL
  Filled 2018-05-08: qty 1

## 2018-05-08 MED ORDER — GLIMEPIRIDE 2 MG PO TABS: 2.0000 mg | ORAL_TABLET | Freq: Every day | ORAL | Status: DC

## 2018-05-08 MED ORDER — SODIUM CHLORIDE 0.9% FLUSH
3.0000 mL | Freq: Two times a day (BID) | INTRAVENOUS | Status: DC
  Administered 2018-05-08 – 2018-05-09 (×2): 3 mL via INTRAVENOUS

## 2018-05-08 MED ORDER — DIAZEPAM 5 MG PO TABS: 5.0000 mg | ORAL_TABLET | Freq: Every evening | ORAL | Status: DC | PRN

## 2018-05-08 MED ORDER — OXYCODONE-ACETAMINOPHEN 5-325 MG PO TABS
1.0000 | ORAL_TABLET | Freq: Four times a day (QID) | ORAL | Status: DC | PRN
  Administered 2018-05-09: 1 via ORAL
  Filled 2018-05-08: qty 1

## 2018-05-08 MED ORDER — CANAGLIFLOZIN 100 MG PO TABS
100.0000 mg | ORAL_TABLET | Freq: Every day | ORAL | Status: DC
  Administered 2018-05-09: 100 mg via ORAL
  Filled 2018-05-08: qty 1

## 2018-05-08 MED ORDER — AMIODARONE HCL 200 MG PO TABS
200.0000 mg | ORAL_TABLET | Freq: Every day | ORAL | Status: DC
  Administered 2018-05-09: 200 mg via ORAL
  Filled 2018-05-08: qty 1

## 2018-05-08 MED ORDER — CHLORHEXIDINE GLUCONATE 0.12 % MT SOLN
15.0000 mL | Freq: Two times a day (BID) | OROMUCOSAL | Status: DC
  Administered 2018-05-09: 15 mL via OROMUCOSAL
  Filled 2018-05-08: qty 15

## 2018-05-08 MED ORDER — ORAL CARE MOUTH RINSE
15.0000 mL | Freq: Two times a day (BID) | OROMUCOSAL | Status: DC
  Administered 2018-05-09 (×2): 15 mL via OROMUCOSAL

## 2018-05-08 MED ORDER — AMITRIPTYLINE HCL 25 MG PO TABS
75.0000 mg | ORAL_TABLET | Freq: Every day | ORAL | Status: DC
  Administered 2018-05-08: 75 mg via ORAL
  Filled 2018-05-08: qty 3

## 2018-05-08 MED ORDER — RIVAROXABAN 20 MG PO TABS
20.0000 mg | ORAL_TABLET | Freq: Every day | ORAL | Status: DC
  Administered 2018-05-09: 20 mg via ORAL
  Filled 2018-05-08: qty 1

## 2018-05-08 MED ORDER — METFORMIN HCL ER 500 MG PO TB24: 500.0000 mg | ORAL_TABLET | Freq: Every day | ORAL | Status: DC

## 2018-05-08 MED ORDER — METFORMIN HCL ER 500 MG PO TB24: 500.0000 mg | ORAL_TABLET | ORAL | Status: DC

## 2018-05-08 MED ORDER — PREGABALIN 25 MG PO CAPS: 25.0000 mg | ORAL_CAPSULE | Freq: Two times a day (BID) | ORAL | Status: DC

## 2018-05-08 MED ORDER — SODIUM CHLORIDE 0.9 % IV BOLUS
1000.0000 mL | Freq: Once | INTRAVENOUS | Status: AC
  Administered 2018-05-08: 1000 mL via INTRAVENOUS

## 2018-05-08 NOTE — Progress Notes (Addendum)
Went through med rec with wife over phone.  Only med there is some question about still is Lyrica.  Will leave this off for the moment until we know for sure.

## 2018-05-08 NOTE — H&P (Addendum)
History and Physical    [name] QAS:[id number] DOB: 1941-01-29 DOA: 05/08/2018  PCP: [name], MD  Patient coming from: Home  I have personally briefly reviewed patient's old medical records in [place]  Chief Complaint: Syncope  HPI: [name] is a 77 y.o. male with medical history significant of AAA s/p repair 2009, CAD, CHF, PAF on xarelto.  Patient presents to ED via EMS after a syncopal episode that occurred after lunch at home.  Began to feel increasingly ill and lightheaded after lunch with gradually increasing symptoms, generalized weakness.  No pain.  Had syncopal episode.  EMS called.  Initially HR 50s, BP 50/30 on EMS arrival.  IVF started by EMS with improving BPs en route.   ED Course: IVF continued in ED and BPs continued to improve.  Creat 1.5 up from 1.2 baseline.  BGL 212.  Was also ~200 with EMS.   Review of Systems: As per HPI otherwise 10 point review of systems negative.   Past Medical History:  Diagnosis Date  . AAA (abdominal aortic aneurysm) ([place])    a. s/p repair 2009.  [name] Acid reflux    takes Prilosec and Protonix daily  . Arthritis    BacK  . CAD in native artery    a. a. prior inf MI 1993. b. PCI to LAD 05/1997. c. recurrent inferolateral MI complicated by V. fib arrest in 04/1998, prior PCI to OM1. d. 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  [name] Chronic combined systolic and diastolic CHF (congestive heart failure) ([place])   . Complication of anesthesia    -years ago hair fell out.;ileus after 2 of his surgeries  . Dry skin   . Foot drop, bilateral   . Hypercholesteremia   . Hypertension   . Ileus ([place])    After AAA  . Incisional hernia    "small, from AAA"  . Ischemic cardiomyopathy   . MI (myocardial infarction) ([place])   . 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 OSA on CPAP    uses CPAP--sleep study done at least 93yrs ago  . PAF (paroxysmal atrial fibrillation) ([place])   . Pain    back pain chronic- seen at pain clinic  . Plantar  fasciitis    bilatetral  . Thrombocytopenia ([place])   . Type II diabetes mellitus ([place])     Past Surgical History:  Procedure Laterality Date  . ABDOMINAL AORTIC ANEURYSM REPAIR    . BACK SURGERY  2012-2013 X 3   Miminal Invasive x 3in [place].  [name] CARDIAC CATHETERIZATION  2009/2012  . CARDIOVERSION N/A 04/12/2015   Procedure: CARDIOVERSION;  Surgeon: Sueanne Margarita, MD;  Location: [facility];  Service: Cardiovascular;  Laterality: N/A;  . CARDIOVERSION N/A 12/26/2016   Procedure: CARDIOVERSION;  Surgeon: Sueanne Margarita, MD;  Location: [facility];  Service: Cardiovascular;  Laterality: N/A;  . COLONOSCOPY    . CORONARY ANGIOPLASTY WITH STENT PLACEMENT  1992; 1997  . LAPAROSCOPIC CHOLECYSTECTOMY    . LUMBAR LAMINECTOMY/DECOMPRESSION MICRODISCECTOMY Right 01/16/2016   Procedure: Right Lumbar five-Sacral one Laminectomy;  Surgeon: Kristeen Miss, MD;  Location: [facility];  Service: Neurosurgery;  Laterality: Right;  Right L5-S1 Laminectomy     reports that he quit smoking about 26 years ago. His smoking use included cigarettes. He has a 33.00 pack-year smoking history. He has never used smokeless tobacco. He reports that he drinks alcohol. He reports that he does not use drugs.  Allergies  Allergen  Reactions  . Cymbalta [Duloxetine Hcl] Nausea And Vomiting and Other (See Comments)    PATIENT STATED THAT HE FELT LIKE HE "WAS GOING TO DIE" Rapid drop in blood pressure; sent him to the ER X2  . Neurontin [Gabapentin] Other (See Comments)    "Makes me goofy, keeps me off balance, clouds my thinking.."  . Lyrica [Pregabalin] Other (See Comments)    "Makes me goofy, keeps me off balance, clouds my thinking..."  . Keppra [Levetiracetam] Nausea And Vomiting    Family History  Problem Relation Age of Onset  . Diabetes Mother   . Coronary artery disease Mother   . Diabetes Father   . Arthritis Father   . Rheum arthritis Father   . Coronary artery disease Father      Prior to Admission  medications   Medication Sig Start Date End Date Taking? Authorizing Provider  amiodarone (PACERONE) 200 MG tablet TAKE 1 TABLET BY MOUTH ONCE DAILY 03/05/18   Belva Crome, MD  amitriptyline (ELAVIL) 25 MG tablet Take 75 mg by mouth at bedtime.  02/19/17   [provider]  atorvastatin (LIPITOR) 10 MG tablet Take 10 mg by mouth daily.    [provider]  Coenzyme Q10 (CO Q 10) 100 MG CAPS Take 100 mg by mouth daily.    [provider]  diazepam (VALIUM) 5 MG tablet Take 5 mg by mouth at bedtime as needed (leg cramps/sleep).  10/31/15   [provider]  empagliflozin (JARDIANCE) 10 MG TABS tablet Take 10 mg by mouth daily with breakfast.     [provider]  finasteride (PROPECIA) 1 MG tablet Take 1 mg by mouth daily as needed (for hair loss).    [provider]  HYDROcodone-acetaminophen (NORCO/VICODIN) 5-325 MG tablet Take 1 tablet by mouth every 4 (four) hours as needed. 10/19/17   Isla Pence, MD  ibuprofen (ADVIL,MOTRIN) 600 MG tablet Take 1 tablet (600 mg total) by mouth every 6 (six) hours as needed. 10/19/17   Isla Pence, MD  Lin[facility] EX) Apply 1 application topically every 4 (four) hours as needed (for nerve pain). FORMULATION WITH LIDOCAINE    [provider]  metFORMIN (GLUCOPHAGE-XR) 500 MG 24 hr tablet Take 500-1,000 mg by mouth See admin instructions. 1,000 mg in the morning and 500 mg in the evening 10/12/15   [provider]  metoprolol tartrate (LOPRESSOR) 50 MG tablet TAKE 1 TABLET BY MOUTH TWICE DAILY 03/27/18   Belva Crome, MD  Multiple Vitamins-Minerals (ONE-A-DAY MENS 50+ ADVANTAGE) TABS Take 1 tablet by mouth daily.    [provider]  omeprazole (PRILOSEC) 20 MG capsule Take 20 mg by mouth daily. 09/14/12   [provider]  oxyCODONE-acetaminophen (PERCOCET/ROXICET) 5-325 MG tablet Take 1 tablet by mouth every 6 (six) hours as needed (pain).     [provider]   ramipril (ALTACE) 10 MG capsule Take 10 mg by mouth daily.     [provider]  rivaroxaban (XARELTO) 20 MG TABS tablet Take 20 mg by mouth daily with supper.    [provider]  sildenafil (REVATIO) 20 MG tablet Take 20-100 mg by mouth daily as needed (for ED).    [provider]  vitamin B-12 (CYANOCOBALAMIN) 1000 MCG tablet Take 1,000 mcg by mouth daily.     [provider]    Physical Exam: Vitals:   05/08/18 1900 05/08/18 1945 05/08/18 2000 05/08/18 2015  BP: 108/61 (!) 114/59 111/60 108/76  Pulse: 74  77 78   Resp: 14 18 17 20   Temp:      TempSrc:      SpO2: 96% 96% 98%     Constitutional: NAD, calm, comfortable Eyes: PERRL, lids and conjunctivae normal ENMT: Mucous membranes are moist. Posterior pharynx clear of any exudate or lesions.Normal dentition.  Neck: normal, supple, no masses, no thyromegaly Respiratory: clear to auscultation bilaterally, no wheezing, no crackles. Normal respiratory effort. No accessory muscle use.  Cardiovascular: Regular rate and rhythm, no murmurs / rubs / gallops. No extremity edema. 2+ pedal pulses. No carotid bruits.  Abdomen: no tenderness, no masses palpated. No hepatosplenomegaly. Bowel sounds positive.  Musculoskeletal: no clubbing / cyanosis. No joint deformity upper and lower extremities. Good ROM, no contractures. Normal muscle tone.  Skin: no rashes, lesions, ulcers. No induration Neurologic: CN 2-12 grossly intact. Sensation intact, DTR normal. Strength 5/5 in all 4.  Psychiatric: Normal judgment and insight. Alert and oriented x 3. Normal mood.    Labs on Admission: I have personally reviewed following labs and imaging studies  CBC: Recent Labs  Lab 05/08/18 1550  WBC 6.8  NEUTROABS 4.6  HGB 15.3  HCT 47.1  MCV 94.8  PLT 626*   Basic Metabolic Panel: Recent Labs  Lab 05/08/18 1550  NA 140  K 4.4  CL 104  CO2 26  GLUCOSE 212*  BUN 28*  CREATININE 1.53*  CALCIUM 9.5   GFR: CrCl  cannot be calculated (Unknown ideal weight.). Liver Function Tests: Recent Labs  Lab 05/08/18 1550  AST 49*  ALT 59*  ALKPHOS 58  BILITOT 1.0  PROT 6.7  ALBUMIN 3.9   No results for input(s): LIPASE, AMYLASE in the last 168 hours. No results for input(s): AMMONIA in the last 168 hours. Coagulation Profile: No results for input(s): INR, PROTIME in the last 168 hours. Cardiac Enzymes: Recent Labs  Lab 05/08/18 1550  TROPONINI <0.03   BNP (last 3 results) No results for input(s): PROBNP in the last 8760 hours. HbA1C: No results for input(s): HGBA1C in the last 72 hours. CBG: No results for input(s): GLUCAP in the last 168 hours. Lipid Profile: No results for input(s): CHOL, HDL, LDLCALC, TRIG, CHOLHDL, LDLDIRECT in the last 72 hours. Thyroid Function Tests: No results for input(s): TSH, T4TOTAL, FREET4, T3FREE, THYROIDAB in the last 72 hours. Anemia Panel: No results for input(s): VITAMINB12, FOLATE, FERRITIN, TIBC, IRON, RETICCTPCT in the last 72 hours. Urine analysis:    Component Value Date/Time   COLORURINE YELLOW 12/21/2016 [place] 12/21/2016 1850   LABSPEC 1.027 12/21/2016 1850   PHURINE 5.0 12/21/2016 1850   GLUCOSEU >=500 (A) 12/21/2016 1850   HGBUR MODERATE (A) 12/21/2016 1850   BILIRUBINUR NEGATIVE 12/21/2016 1850   KETONESUR 5 (A) 12/21/2016 1850   PROTEINUR NEGATIVE 12/21/2016 1850   UROBILINOGEN 0.2 12/12/2014 0150   NITRITE NEGATIVE 12/21/2016 1850   LEUKOCYTESUR NEGATIVE 12/21/2016 1850    Radiological Exams on Admission: US Aorta  Result Date: 05/08/2018 CLINICAL DATA:  History of abdominal aortic aneurysm repair in 2009. EXAM: ULTRASOUND OF ABDOMINAL AORTA TECHNIQUE: Ultrasound examination of the abdominal aorta was performed to evaluate for abdominal aortic aneurysm. COMPARISON:  CT 04/29/2018.  Abdominal ultrasound 10/18/2009. FINDINGS: Abdominal aortic measurements as follows: Proximal:  2.8 cm Mid:  2.9 cm Distal:  3.1 cm The  iliac arteries are obscured by bowel gas. The remainder of the examination was also limited by bowel gas, and Doppler evaluation could not be performed. IMPRESSION: No recurrent abdominal  aortic aneurysm identified status post abdominal aortic aneurysm repair. Examination is mildly limited by bowel gas, and the iliac arteries are not visualized. Electronically Signed   By: Richardean Sale M.D.   On: 05/08/2018 19:34   Dg Chest Port 1 View  Result Date: 05/08/2018 CLINICAL DATA:  Near syncope EXAM: PORTABLE CHEST 1 VIEW COMPARISON:  02/04/2018 FINDINGS: Artifact overlies the chest. Heart size is normal. Chronic aortic atherosclerosis. Chronic pulmonary scarring. No evidence of active infiltrate, mass, effusion or collapse. No pulmonary edema. IMPRESSION: No active disease.  Aortic atherosclerosis.  Pulmonary scarring. Electronically Signed   By: Nelson Chimes M.D.   On: 05/08/2018 16:10    EKG: Independently reviewed.  Assessment/Plan Principal Problem:   Syncope Active Problems:   DM (diabetes mellitus), type 2, uncontrolled w/neurologic complication (HCC)   Paroxysmal atrial fibrillation with RVR (HCC)   Chronic combined systolic and diastolic heart failure (HCC)   Hypotension    1. Syncope - 1. Syncope pathway 2. 2d echo 3. Tele monitor 2. Hypotension - 1. Seems to be fluid responsive, 1L bolus in ED + IVF with EMS. 2. Will hold any diuretics he is on. 3. Will hold any BP meds (except rate meds for PAF) 4. Repeat BMP in AM 3. PAF - 1. Continue xarelto 2. Continue lopressor / amiodarone 4. CHF - 1. Holding diuretics 5. DM2 - 1. Continue home meds  DVT prophylaxis: Xarelto Code Status: Full Family Communication: Daughter at bedside Disposition Plan: Home after admit Consults called: None Admission status: Place in [place], [place] [facility] Pager [phone] Only works nights!  If 7AM-7PM, please contact the primary day team physician taking care of  patient  [email or web address] Password [facility]  05/08/2018, 8:35 PM

## 2018-05-08 NOTE — ED Notes (Signed)
Pt given [place] sandwich and diet coke. Ok by [employer]

## 2018-05-08 NOTE — ED Provider Notes (Signed)
[facility] EMERGENCY DEPARTMENT Provider Note   CSN: [id number] Arrival date & time: 05/08/18  1527     History   Chief Complaint Chief Complaint  Patient presents with  . Hypotension  . Loss of Consciousness    HPI [name] is a 77 y.o. male.  HPI  Patient presents with concern for near syncope and hypotension. The patient himself is now awake and alert, but reportedly was listless, responsive only to name when EMS arrived. He was also hypotensive, 50/30 on EMS arrival. The patient himself now states that he was feeling poorly earlier today, with gradually increasing weakness and lightheadedness, with near blacking out, without pain. He notes that symptoms began about 3 hours ago, after eating lunch. He denies recent medication change, diet change, activity change. He has a notable history of AAA status post graft repair. He notes that he had an unremarkable primary care visit about 1 week ago. EMS notes that after the patient was provided fluids he began to have increased blood pressure in route. Not long after the patient's initial evaluation by me, his wife arrives, corroborates the history.  Past Medical History:  Diagnosis Date  . AAA (abdominal aortic aneurysm) ([place])    a. s/p repair 2009.  [name] Acid reflux    takes Prilosec and Protonix daily  . Arthritis    BacK  . CAD in native artery    a. a. prior inf MI 1993. b. PCI to LAD 05/1997. c. recurrent inferolateral MI complicated by V. fib arrest in 04/1998, prior PCI to OM1. d. 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  [name] Chronic combined systolic and diastolic CHF (congestive heart failure) ([place])   . Complication of anesthesia    -years ago hair fell out.;ileus after 2 of his surgeries  . Dry skin   . Foot drop, bilateral   . Hypercholesteremia   . Hypertension   . Ileus ([place])    After AAA  . Incisional hernia    "small, from AAA"  . Ischemic cardiomyopathy   . MI (myocardial infarction) ([place])     . 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 OSA on CPAP    uses CPAP--sleep study done at least 50yrs ago  . PAF (paroxysmal atrial fibrillation) ([place])   . Pain    back pain chronic- seen at pain clinic  . Plantar fasciitis    bilatetral  . Thrombocytopenia ([place])   . Type II diabetes mellitus [facility])     Patient Active Problem List   Diagnosis Date Noted  . On amiodarone therapy 08/26/2017  . Near syncope 06/11/2017  . Hypoxia   . Pneumonia of left lower lobe due to infectious organism ([place])   . Gastroenteritis and colitis, viral 04/27/2016  . Orthostasis 04/26/2016  . Nausea vomiting and diarrhea 04/26/2016  . Lumbar radiculopathy, chronic 01/16/2016  . Hematoma of left kidney 11/01/2015  . Hyperlipidemia 12/12/2014  . SIRS (systemic inflammatory response syndrome) ([place]) 12/12/2014  . Influenza due to identified novel influenza A virus with other respiratory manifestations 11/07/2013  . Long term current use of anticoagulant therapy 09/08/2013    Class: Chronic  . Coronary atherosclerosis of native coronary artery 08/16/2013    Class: Chronic  . Chronic combined systolic and diastolic heart failure (HCC) 08/16/2013    Class: Chronic  . Paroxysmal atrial fibrillation with RVR ([place]) 08/13/2013  . Paralytic ileus ([place]) 05/07/2012  . Nausea and vomiting 01/01/2012  . DM (diabetes mellitus),  type 2, uncontrolled w/neurologic complication ([place]) 40/07/2724  . Hypertension   . AAA (abdominal aortic aneurysm) ([place])   . Acid reflux   . Sleep apnea     Past Surgical History:  Procedure Laterality Date  . ABDOMINAL AORTIC ANEURYSM REPAIR    . BACK SURGERY  2012-2013 X 3   Miminal Invasive x 3in [place].  [name] CARDIAC CATHETERIZATION  2009/2012  . CARDIOVERSION N/A 04/12/2015   Procedure: CARDIOVERSION;  Surgeon: Sueanne Margarita, MD;  Location: [facility];  Service: Cardiovascular;  Laterality: N/A;  . CARDIOVERSION N/A 12/26/2016   Procedure: CARDIOVERSION;  Surgeon: Sueanne Margarita, MD;   Location: [facility];  Service: Cardiovascular;  Laterality: N/A;  . COLONOSCOPY    . CORONARY ANGIOPLASTY WITH STENT PLACEMENT  1992; 1997  . LAPAROSCOPIC CHOLECYSTECTOMY    . LUMBAR LAMINECTOMY/DECOMPRESSION MICRODISCECTOMY Right 01/16/2016   Procedure: Right Lumbar five-Sacral one Laminectomy;  Surgeon: Kristeen Miss, MD;  Location: [facility];  Service: Neurosurgery;  Laterality: Right;  Right L5-S1 Laminectomy        Home Medications    Prior to Admission medications   Medication Sig Start Date End Date Taking? Authorizing Provider  amiodarone (PACERONE) 200 MG tablet TAKE 1 TABLET BY MOUTH ONCE DAILY 03/05/18   Belva Crome, MD  amitriptyline (ELAVIL) 25 MG tablet Take 75 mg by mouth at bedtime.  02/19/17   [provider]  atorvastatin (LIPITOR) 10 MG tablet Take 10 mg by mouth daily.    [provider]  Coenzyme Q10 (CO Q 10) 100 MG CAPS Take 100 mg by mouth daily.    [provider]  diazepam (VALIUM) 5 MG tablet Take 5 mg by mouth at bedtime as needed (leg cramps/sleep).  10/31/15   [provider]  empagliflozin (JARDIANCE) 10 MG TABS tablet Take 10 mg by mouth daily with breakfast.     [provider]  finasteride (PROPECIA) 1 MG tablet Take 1 mg by mouth daily as needed (for hair loss).    [provider]  HYDROcodone-acetaminophen (NORCO/VICODIN) 5-325 MG tablet Take 1 tablet by mouth every 4 (four) hours as needed. 10/19/17   Isla Pence, MD  ibuprofen (ADVIL,MOTRIN) 600 MG tablet Take 1 tablet (600 mg total) by mouth every 6 (six) hours as needed. 10/19/17   Isla Pence, MD  [facility] EX) Apply 1 application topically every 4 (four) hours as needed (for nerve pain). FORMULATION WITH LIDOCAINE    [provider]  metFORMIN (GLUCOPHAGE-XR) 500 MG 24 hr tablet Take 500-1,000 mg by mouth See admin instructions. 1,000 mg in the morning and 500 mg in the evening 10/12/15   [provider]    metoprolol tartrate (LOPRESSOR) 50 MG tablet TAKE 1 TABLET BY MOUTH TWICE DAILY 03/27/18   Belva Crome, MD  Multiple Vitamins-Minerals (ONE-A-DAY MENS 50+ ADVANTAGE) TABS Take 1 tablet by mouth daily.    [provider]  omeprazole (PRILOSEC) 20 MG capsule Take 20 mg by mouth daily. 09/14/12   [provider]  oxyCODONE-acetaminophen (PERCOCET/ROXICET) 5-325 MG tablet Take 1 tablet by mouth every 6 (six) hours as needed (pain).     [provider]  ramipril (ALTACE) 10 MG capsule Take 10 mg by mouth daily.     [provider]  rivaroxaban (XARELTO) 20 MG TABS tablet Take 20 mg by mouth daily with supper.    [provider]  sildenafil (REVATIO) 20 MG tablet Take 20-100 mg by mouth daily as needed (for ED).  [provider]  vitamin B-12 (CYANOCOBALAMIN) 1000 MCG tablet Take 1,000 mcg by mouth daily.     [provider]    Family History Family History  Problem Relation Age of Onset  . Diabetes Mother   . Coronary artery disease Mother   . Diabetes Father   . Arthritis Father   . Rheum arthritis Father   . Coronary artery disease Father     Social History Social History   Tobacco Use  . Smoking status: Former Smoker    Packs/day: 1.00    Years: 33.00    Pack years: 33.00    Types: Cigarettes    Last attempt to quit: 01/01/1992    Years since quitting: 26.3  . Smokeless tobacco: Never Used  Substance Use Topics  . Alcohol use: Yes    Comment: 6/30;2017 "GLASS OF WINE Q COUPLE MONTHS, IF THAT"  . Drug use: No     Allergies   Cymbalta [duloxetine hcl]; Neurontin [gabapentin]; Lyrica [pregabalin]; and Keppra [levetiracetam]   Review of Systems Review of Systems  Constitutional:       Per HPI, otherwise negative  HENT:       Per HPI, otherwise negative  Respiratory:       Per HPI, otherwise negative  Cardiovascular:       Per HPI, otherwise negative  Gastrointestinal: Negative for vomiting.  Endocrine:        Negative aside from HPI  Genitourinary:       Neg aside from HPI   Musculoskeletal:       Per HPI, otherwise negative  Skin: Negative.   Neurological: Positive for dizziness, weakness and light-headedness. Negative for syncope.     Physical Exam Updated Vital Signs BP 108/61   Pulse 74   Temp (!) 97.5 F (36.4 C) (Oral)   Resp 14   SpO2 96%   Physical Exam  Constitutional: He is oriented to person, place, and time. He has a sickly appearance. No distress.  HENT:  Head: Normocephalic and atraumatic.  Mouth/Throat: Mucous membranes are dry.  Eyes: Conjunctivae and EOM are normal.  Cardiovascular: Normal rate and regular rhythm.  Pulmonary/Chest: Effort normal. No stridor. No respiratory distress.  Abdominal: He exhibits no distension.  Musculoskeletal: He exhibits no edema.  Neurological: He is alert and oriented to person, place, and time.  Skin: Skin is warm and dry.  Psychiatric: He has a normal mood and affect.  Nursing note and vitals reviewed.    ED Treatments / Results  Labs (all labs ordered are listed, but only abnormal results are displayed) Labs Reviewed  COMPREHENSIVE METABOLIC PANEL - Abnormal; Notable for the following components:      Result Value   Glucose, Bld 212 (*)    BUN 28 (*)    Creatinine, Ser 1.53 (*)    AST 49 (*)    ALT 59 (*)    GFR calc non Af Amer 42 (*)    GFR calc Af Amer 48 (*)    All other components within normal limits  BRAIN NATRIURETIC PEPTIDE - Abnormal; Notable for the following components:   B Natriuretic Peptide 135.7 (*)    All other components within normal limits  CBC WITH DIFFERENTIAL/PLATELET - Abnormal; Notable for the following components:   Platelets 148 (*)    All other components within normal limits  TROPONIN I    EKG EKG Interpretation  Date/Time:  Friday May 08 2018 15:32:23 EDT Ventricular Rate:  60 PR Interval:  QRS Duration: 156 QT Interval:  465 QTC Calculation: 465 R  Axis:   -35 Text Interpretation:  Sinus rhythm Premature ventricular complexes T wave abnormality Non-specific intra-ventricular conduction delay similar to prior Abnormal ekg Confirmed by Carmin Muskrat [phone]) on 05/08/2018 3:55:08 PM   Radiology US Aorta  Result Date: 05/08/2018 CLINICAL DATA:  History of abdominal aortic aneurysm repair in 2009. EXAM: ULTRASOUND OF ABDOMINAL AORTA TECHNIQUE: Ultrasound examination of the abdominal aorta was performed to evaluate for abdominal aortic aneurysm. COMPARISON:  CT 04/29/2018.  Abdominal ultrasound 10/18/2009. FINDINGS: Abdominal aortic measurements as follows: Proximal:  2.8 cm Mid:  2.9 cm Distal:  3.1 cm The iliac arteries are obscured by bowel gas. The remainder of the examination was also limited by bowel gas, and Doppler evaluation could not be performed. IMPRESSION: No recurrent abdominal aortic aneurysm identified status post abdominal aortic aneurysm repair. Examination is mildly limited by bowel gas, and the iliac arteries are not visualized. Electronically Signed   By: Richardean Sale M.D.   On: 05/08/2018 19:34   Dg Chest Port 1 View  Result Date: 05/08/2018 CLINICAL DATA:  Near syncope EXAM: PORTABLE CHEST 1 VIEW COMPARISON:  02/04/2018 FINDINGS: Artifact overlies the chest. Heart size is normal. Chronic aortic atherosclerosis. Chronic pulmonary scarring. No evidence of active infiltrate, mass, effusion or collapse. No pulmonary edema. IMPRESSION: No active disease.  Aortic atherosclerosis.  Pulmonary scarring. Electronically Signed   By: Nelson Chimes M.D.   On: 05/08/2018 16:10    Procedures Procedures (including critical care time)  Medications Ordered in ED Medications  iopamidol (ISOVUE-370) 76 % injection (has no administration in time range)  sodium chloride 0.9 % bolus 1,000 mL (0 mLs Intravenous Stopped 05/08/18 1712)   After the initial evaluation with concern for his aortic aneurysm, bedside ultrasound was performed, images  not saved due to computer error, but no evidence for free intraperitoneal fluid, no clear visualization of the graft itself.   EMERGENCY DEPARTMENT ULTRASOUND  Study: Limited Retroperitoneal Ultrasound of the Abdominal Aorta.  INDICATIONS:Abnormal vital signs and Age>55 Multiple views of the abdominal aorta were obtained in real-time from the diaphragmatic hiatus to the aortic bifurcation in transverse planes with a multi-frequency probe.  PERFORMED BY: Myself IMAGES ARCHIVED?: No LIMITATIONS:  Body habitus INTERPRETATION:  no free fluid, no obvious enlargement of the aorta.     Initial Impression / Assessment and Plan / ED Course  I have reviewed the triage vital signs and the nursing notes.  Pertinent labs & imaging results that were available during my care of the patient were reviewed by me and considered in my medical decision making (see chart for details).     4:48 PM Map 61, patient remains oriented x3, now accompanied by his wife, who corroborates his HPI.  5:44 PM Blood pressure continues to improve, with map 69 Patient's initial labs notable for elevated creatinine beyond baseline, diminished GFR. Given this, patient not a candidate for contrast study. However, the patient has had CT scan within the past week, results as below, somewhat reassuring IMPRESSION: Changes consistent with prior open AAA repair. Patent graft is noted. Slight aneurysmal dilatation of the infrarenal aorta above the graft is noted to 3.4 cm.   Chronic changes as described above without acute abnormality.     Electronically Signed   By: Inez Catalina M.D.   On: 04/29/2018 12:50  8:06 PM Blood pressure now 1 one 5/7 0. Color has returned to the patient's face, he is awake, alert. Discussed findings  with him and his wife, including reassuring ultrasound, no evidence for disruption of the abdominal aortic aneurysm graft. Patient's blood pressure is improved, with fluid resuscitation, and  though he has some evidence for worsening renal function, this is likely been corrected with his fluids. Patient's episode of near-syncope and LH w hypotension may be multi-factorial w meds and dehydration likely contributing.  No early e/o acs nor CHF exacerbation.   Final Clinical Impressions(s) / ED Diagnoses  Hypotension Had near syncope   Carmin Muskrat, MD 05/08/18 2008

## 2018-05-08 NOTE — ED Triage Notes (Signed)
Pt BIB [facility] after a syncopal episode, initial EMS BP 50/30, pt alert to pain at that time. Given 421ml NS, 4mg  zofran PTA. Pt arrived on NRB, alert to voice, answering questions appropriately, denies pain. Pt reports feeling lightheaded prior to syncopal episode.

## 2018-05-08 NOTE — ED Notes (Signed)
Patient transported to Ultrasound 

## 2018-05-09 ENCOUNTER — Observation Stay (HOSPITAL_BASED_OUTPATIENT_CLINIC_OR_DEPARTMENT_OTHER): Payer: Medicare Other

## 2018-05-09 DIAGNOSIS — E1149 Type 2 diabetes mellitus with other diabetic neurological complication: Secondary | ICD-10-CM | POA: Diagnosis not present

## 2018-05-09 DIAGNOSIS — I959 Hypotension, unspecified: Secondary | ICD-10-CM | POA: Diagnosis not present

## 2018-05-09 DIAGNOSIS — I5042 Chronic combined systolic (congestive) and diastolic (congestive) heart failure: Secondary | ICD-10-CM | POA: Diagnosis not present

## 2018-05-09 DIAGNOSIS — R55 Syncope and collapse: Secondary | ICD-10-CM | POA: Diagnosis not present

## 2018-05-09 DIAGNOSIS — E1165 Type 2 diabetes mellitus with hyperglycemia: Secondary | ICD-10-CM

## 2018-05-09 DIAGNOSIS — I480 Paroxysmal atrial fibrillation: Secondary | ICD-10-CM | POA: Diagnosis not present

## 2018-05-09 DIAGNOSIS — R42 Dizziness and giddiness: Secondary | ICD-10-CM

## 2018-05-09 DIAGNOSIS — E860 Dehydration: Secondary | ICD-10-CM | POA: Diagnosis not present

## 2018-05-09 LAB — BASIC METABOLIC PANEL
Anion gap: 12 (ref 5–15)
BUN: 30 mg/dL — ABNORMAL HIGH (ref 8–23)
CO2: 23 mmol/L (ref 22–32)
Calcium: 9 mg/dL (ref 8.9–10.3)
Chloride: 104 mmol/L (ref 98–111)
Creatinine, Ser: 1.24 mg/dL (ref 0.61–1.24)
GFR calc Af Amer: >60 mL/min (ref >60)
GFR calc non Af Amer: 54 mL/min — ABNORMAL LOW (ref >60)
Glucose, Bld: 185 mg/dL — ABNORMAL HIGH (ref 70–99)
Potassium: 4.3 mmol/L (ref 3.5–5.1)
Sodium: 139 mmol/L (ref 135–145)

## 2018-05-09 LAB — ECHOCARDIOGRAM COMPLETE
Height: 75 in
Weight: 3196.8 oz

## 2018-05-09 LAB — GLUCOSE, CAPILLARY: Glucose-Capillary: 193 mg/dL — ABNORMAL HIGH (ref 70–99)

## 2018-05-09 LAB — TROPONIN I
Troponin I: <0.03 ng/mL (ref <0.03)
Troponin I: <0.03 ng/mL (ref <0.03)

## 2018-05-09 NOTE — Progress Notes (Signed)
PROGRESS NOTE  [name] KYH:[id number] DOB: 1941-08-15 DOA: 05/08/2018 PCP: [name], MD  Brief Narrative: 77 year old male presented with near syncope, lightheadedness, hypotension in the field which responded to fluids in the emergency department.  Because of his history of aortic aneurysm repair, this was imaged and found to be unremarkable.  Admitted for evaluation of syncope/presyncope, echocardiogram, hypotension.  Assessment/Plan Near syncope.  Note by history, corroborated by wife, the patient never had syncope.  He had presyncopal symptoms and listlessness.  Orthostatics equivocal in the emergency department.  Abdominal ultrasound showed no evidence of recurrent aortic aneurysm.  X-ray no acute disease. --Troponins negative.  CBC unremarkable.  Renal function has improved. --Follow-up echocardiogram, repeat EKG. --Symptoms has resolved, if work-up as above is unremarkable, anticipate discharge home this afternoon with follow-up with Dr. Tamala Julian his cardiologist in the outpatient setting.  Hypotension.  Fluid responsive.  Etiology unclear, no recent medication changes, drinks a lot of water.  Chemistry panel did suggest dehydration. --Resolved.  Dehydration, resolved.  Renal function appears to be close to baseline.  Paroxysmal atrial fibrillation --Currently in sinus rhythm.  Continue Xarelto, amiodarone, Lopressor.  PMH CAD, status post PCI; combined systolic/diastolic CHF; ischemic cardiomyopathy, OSA on CPAP, thrombocytopenia, diabetes mellitus type 2   Overall improved.  Likely home later today based on parameters discussed above.  DVT prophylaxis: Xarelto Code Status: Full Family Communication: wife at bedside Disposition Plan: home     Murray Hodgkins, MD  [facility] Direct contact: [phone] --[address] [place]  --[email or web address]; password [email or web address]  7PM-7AM contact night coverage as above 05/09/2018, 12:24 PM  LOS: 0 days    Consultants:    Procedures:  Antimicrobials:    Interval history/Subjective: Feels a lot better today.  No lightheadedness or presyncopal symptoms today.  Ambulating to bathroom okay.  Reports yesterday he ate a normal lunch salad and drank 3 glasses of water.  On the way home from this he began to feel lightheaded and like his vision was gray.  He is able to walk into his house but felt extremely weak and was listless.  Symptoms resolved in the emergency department.  He had no chest pain or shortness of breath.  Objective: Vitals:  Vitals:   05/09/18 1020 05/09/18 1148  BP: 113/63 113/65  Pulse: 83 72  Resp:    Temp:  98 F (36.7 C)  SpO2:  95%    Exam:  Constitutional:  . Appears calm and comfortable Eyes:  . pupils appear normal . Normal lids ENMT:  . grossly normal hearing  Respiratory:  . CTA bilaterally, no w/r/r.  . Respiratory effort normal.  Cardiovascular:  . RRR, no m/r/g . No LE extremity edema   . Telemetry sinus rhythm Musculoskeletal:  . RUE, LUE, RLE, LLE   o strength and tone grossly normal Psychiatric:  . Mental status o Mood, affect appropriate  I have personally reviewed the following:   Labs:  Blood sugars are stable  Troponins are negative  Creatinine has improved 1.53 >> 1.24.  BUN without significant change.  Remainder BMP unremarkable.  Glucose noted to be 185.  Platelets minimally low which has been seen in the past  Imaging studies:  CXR NAD  Medical tests:  EKG SR, PVCs, NSIVCD (old), inferior MI old, anterolateral MI (new0.   Test discussed with performing physician:    Decision to obtain old records:    Review and summation of old records:    Scheduled Meds: . amiodarone  200 mg Oral  Daily  . amitriptyline  75 mg Oral QHS  . atorvastatin  10 mg Oral Daily  . canagliflozin  100 mg Oral QAC breakfast  . chlorhexidine  15 mL Mouth Rinse BID  . mouth rinse  15 mL Mouth Rinse q12n4p  . metFORMIN  1,000  mg Oral Q breakfast   And  . metFORMIN  500 mg Oral Q supper  . metoprolol tartrate  50 mg Oral BID  . pantoprazole  40 mg Oral Daily  . rivaroxaban  20 mg Oral Q supper  . sodium chloride flush  3 mL Intravenous Q12H  . vitamin B-12  1,000 mcg Oral Daily   Continuous Infusions:  Principal Problem:   Syncope Active Problems:   DM (diabetes mellitus), type 2, uncontrolled w/neurologic complication (HCC)   Paroxysmal atrial fibrillation with RVR (HCC)   Chronic combined systolic and diastolic heart failure (HCC)   Hypotension   LOS: 0 days

## 2018-05-09 NOTE — Discharge Instructions (Addendum)
Information on my medicine - XARELTO (Rivaroxaban)  Why was Xarelto prescribed for you? Xarelto was prescribed for you to reduce the risk of a blood clot forming that can cause a stroke if you have a medical condition called atrial fibrillation (a type of irregular heartbeat).  What do you need to know about xarelto ? Take your Xarelto ONCE DAILY at the same time every day with your evening meal. If you have difficulty swallowing the tablet whole, you may crush it and mix in applesauce just prior to taking your dose.  Take Xarelto exactly as prescribed by your doctor and DO NOT stop taking Xarelto without talking to the doctor who prescribed the medication.  Stopping without other stroke prevention medication to take the place of Xarelto may increase your risk of developing a clot that causes a stroke.  Refill your prescription before you run out.  After discharge, you should have regular check-up appointments with your healthcare provider that is prescribing your Xarelto.  In the future your dose may need to be changed if your kidney function or weight changes by a significant amount.  What do you do if you miss a dose? If you are taking Xarelto ONCE DAILY and you miss a dose, take it as soon as you remember on the same day then continue your regularly scheduled once daily regimen the next day. Do not take two doses of Xarelto at the same time or on the same day.   Important Safety Information A possible side effect of Xarelto is bleeding. You should call your healthcare provider right away if you experience any of the following: ? Bleeding from an injury or your nose that does not stop. ? Unusual colored urine (red or dark brown) or unusual colored stools (red or black). ? Unusual bruising for unknown reasons. ? A serious fall or if you hit your head (even if there is no bleeding).  Some medicines may interact with Xarelto and might increase your risk of bleeding while on  Xarelto. To help avoid this, consult your healthcare provider or pharmacist prior to using any new prescription or non-prescription medications, including herbals, vitamins, non-steroidal anti-inflammatory drugs (NSAIDs) and supplements.  This website has more information on Xarelto: [email or web address]-benson.[email or web address]    Heart-Healthy Eating Plan Heart-healthy meal planning includes:  Limiting unhealthy fats.  Increasing healthy fats.  Making other small dietary changes.  You may need to talk with your doctor or a diet specialist (dietitian) to create an eating plan that is right for you. What types of fat should I choose?  Choose healthy fats. These include olive oil and canola oil, flaxseeds, walnuts, almonds, and seeds.  Eat more omega-3 fats. These include salmon, mackerel, sardines, tuna, flaxseed oil, and ground flaxseeds. Try to eat fish at least twice each week.  Limit saturated fats. ? Saturated fats are often found in animal products, such as meats, butter, and cream. ? Plant sources of saturated fats include palm oil, palm kernel oil, and coconut oil.  Avoid foods with partially hydrogenated oils in them. These include stick margarine, some tub margarines, cookies, crackers, and other baked goods. These contain trans fats. What general guidelines do I need to follow?  Check food labels carefully. Identify foods with trans fats or high amounts of saturated fat.  Fill one half of your plate with vegetables and green salads. Eat 4-5 servings of vegetables per day. A serving of vegetables is: ? 1 cup of raw leafy vegetables. ?  cup of raw or  cooked cut-up vegetables. ?  cup of vegetable juice.  Fill one fourth of your plate with whole grains. Look for the word "whole" as the first word in the ingredient list.  Fill one fourth of your plate with lean protein foods.  Eat 4-5 servings of fruit per day. A serving of fruit is: ? One medium whole fruit. ?  cup of dried fruit. ?  cup  of fresh, frozen, or canned fruit. ?  cup of 100% fruit juice.  Eat more foods that contain soluble fiber. These include apples, broccoli, carrots, beans, peas, and barley. Try to get 20-30 g of fiber per day.  Eat more home-cooked food. Eat less restaurant, buffet, and fast food.  Limit or avoid alcohol.  Limit foods high in starch and sugar.  Avoid fried foods.  Avoid frying your food. Try baking, boiling, grilling, or broiling it instead. You can also reduce fat by: ? Removing the skin from poultry. ? Removing all visible fats from meats. ? Skimming the fat off of stews, soups, and gravies before serving them. ? Steaming vegetables in water or broth.  Lose weight if you are overweight.  Eat 4-5 servings of nuts, legumes, and seeds per week: ? One serving of dried beans or legumes equals  cup after being cooked. ? One serving of nuts equals 1 ounces. ? One serving of seeds equals  ounce or one tablespoon.  You may need to keep track of how much salt or sodium you eat. This is especially true if you have high blood pressure. Talk with your doctor or dietitian to get more information. What foods can I eat? Grains Breads, including [place], white, pita, wheat, raisin, rye, oatmeal, and [place]. Tortillas that are neither fried nor made with lard or trans fat. Low-fat rolls, including hotdog and hamburger buns and English muffins. Biscuits. Muffins. Waffles. Pancakes. Light popcorn. Whole-grain cereals. Flatbread. Melba toast. Pretzels. Breadsticks. Rusks. Low-fat snacks. Low-fat crackers, including oyster, saltine, matzo, graham, animal, and rye. Rice and pasta, including brown rice and pastas that are made with whole wheat. Vegetables All vegetables. Fruits All fruits, but limit coconut. Meats and Other Protein Sources Lean, well-trimmed beef, veal, pork, and lamb. Chicken and [place] without skin. All fish and shellfish. Wild duck, rabbit, pheasant, and venison. Egg whites or  low-cholesterol egg substitutes. Dried beans, peas, lentils, and tofu. Seeds and most nuts. Dairy Low-fat or nonfat cheeses, including ricotta, string, and mozzarella. Skim or 1% milk that is liquid, powdered, or evaporated. Buttermilk that is made with low-fat milk. Nonfat or low-fat yogurt. Beverages Mineral water. Diet carbonated beverages. Sweets and Desserts Sherbets and fruit ices. Honey, jam, marmalade, jelly, and syrups. Meringues and gelatins. Pure sugar candy, such as hard candy, jelly beans, gumdrops, mints, marshmallows, and small amounts of dark chocolate. [employer] Eat all sweets and desserts in moderation. Fats and Oils Nonhydrogenated (trans-free) margarines. Vegetable oils, including soybean, sesame, sunflower, olive, peanut, safflower, corn, canola, and cottonseed. Salad dressings or mayonnaise made with a vegetable oil. Limit added fats and oils that you use for cooking, baking, salads, and as spreads. Other Cocoa powder. Coffee and tea. All seasonings and condiments. The items listed above may not be a complete list of recommended foods or beverages. Contact your dietitian for more options. What foods are not recommended? Grains Breads that are made with saturated or trans fats, oils, or whole milk. Croissants. Butter rolls. Cheese breads. Sweet rolls. Donuts. Buttered popcorn. Chow mein noodles. High-fat crackers, such as  cheese or butter crackers. Meats and Other Protein Sources Fatty meats, such as hotdogs, short ribs, sausage, spareribs, bacon, rib eye roast or steak, and mutton. High-fat deli meats, such as salami and bologna. Caviar. Domestic duck and goose. Organ meats, such as kidney, liver, sweetbreads, and heart. Dairy Cream, sour cream, cream cheese, and creamed cottage cheese. Whole-milk cheeses, including blue (bleu), Monterey Jack, [place], [place], American, [place], Swiss, cheddar, [place], and [place]. Whole or 2% milk that is liquid, evaporated, or  condensed. Whole buttermilk. Cream sauce or high-fat cheese sauce. Yogurt that is made from whole milk. Beverages Regular sodas and juice drinks with added sugar. Sweets and Desserts Frosting. Pudding. Cookies. Cakes other than angel food cake. Candy that has milk chocolate or white chocolate, hydrogenated fat, butter, coconut, or unknown ingredients. Buttered syrups. Full-fat ice cream or ice cream drinks. Fats and Oils Gravy that has suet, meat fat, or shortening. Cocoa butter, hydrogenated oils, palm oil, coconut oil, palm kernel oil. These can often be found in baked products, candy, fried foods, nondairy creamers, and whipped toppings. Solid fats and shortenings, including bacon fat, salt pork, lard, and butter. Nondairy cream substitutes, such as coffee creamers and sour cream substitutes. Salad dressings that are made of unknown oils, cheese, or sour cream. The items listed above may not be a complete list of foods and beverages to avoid. Contact your dietitian for more information. This information is not intended to replace advice given to you by your health care provider. Make sure you discuss any questions you have with your health care provider. Document Released: 04/14/2012 Document Revised: 03/21/2016 Document Reviewed: 04/07/2014 Elsevier Interactive Patient Education  [employer].

## 2018-05-09 NOTE — Discharge Summary (Signed)
Physician Discharge Summary  NELSON NOONE VFI:[id number] DOB: [birth year] DOA: 05/08/2018  PCP: [name], MD  Admit date: 05/08/2018 Discharge date: 05/09/2018  Recommendations for Outpatient Follow-up:   Near syncope.  Note by history, corroborated by wife, the patient never had syncope.  He had presyncopal symptoms and listlessness. Orthostatics equivocal in the emergency department.  Abdominal ultrasound showed no evidence of recurrent aortic aneurysm.  X-ray no acute disease. --Troponins negative.  CBC unremarkable.  Renal function has improved. --Echocardiogram reassuring, no significant change.  Repeat EKG reassuring.  No acute changes seen.  Previous EKG changes were nonspecific anterolaterally.  No further evaluation recommended at this point.   Follow-up Information    [name], MD Follow up.   Specialty:  Internal Medicine Why:  as needed Contact information: [address] [place] [address] [phone]        Belva Crome, MD .   Specialty:  Cardiology Contact information: [phone] [address] [place] [address] [phone]            Discharge Diagnoses:  1. Near syncope 2. Hypotension 3. Dehydration 4. Paroxysmal atrial fibrillation  Discharge Condition: improved Disposition: home  Diet recommendation: heart healthy  Filed Weights   05/08/18 2144 05/09/18 0529  Weight: 91.8 kg (202 lb 6.4 oz) 90.6 kg (199 lb 12.8 oz)    History of present illness:  77 year old male presented with near syncope, lightheadedness, hypotension in the field which responded to fluids in the emergency department.  Because of his history of aortic aneurysm repair, this was imaged and found to be unremarkable.  Admitted for evaluation of presyncope, echocardiogram, hypotension.  Hospital Course:  Observed overnight.  No events on telemetry.  Troponins were negative. Patient asymptomatic with no recurrence of symptoms.  Ambulating  without difficulty.  Wife does report that he has had a history of several falls and has difficulty with balance secondary to neuropathy.  Repeat EKG showed sinus rhythm, inferior infarct, old, first-degree AV block, no acute changes--inferolateral changes were resolved.  Echocardiogram was without significant change.  Etiology of the patient's symptoms is unclear his orthostatics were equivocal.  However there is no evidence of ACS or new cardiac process.  As symptoms were resolved, he was discharged home in stable condition.  He follows with Dr. Tamala Julian, I will send him a message to apprise him of this hospitalization.  Near syncope.  Note by history, corroborated by wife, the patient never had syncope.  He had presyncopal symptoms and listlessness.  Orthostatics equivocal in the emergency department.  Abdominal ultrasound showed no evidence of recurrent aortic aneurysm.  X-ray no acute disease. --Troponins negative.  CBC unremarkable.  Renal function has improved. --Echocardiogram reassuring, no significant change.  Repeat EKG reassuring.  No acute changes seen.  Previous EKG changes were nonspecific anterolaterally.  No further evaluation recommended at this point.  Hypotension.  Fluid responsive.  Etiology unclear, no recent medication changes, drinks a lot of water.  Chemistry panel did suggest dehydration. --Resolved.  Dehydration, resolved.  Renal function appears to be close to baseline.  Paroxysmal atrial fibrillation --Currently in sinus rhythm.  Continue Xarelto, amiodarone, Lopressor.  Consultants:  None  Procedures:  Echo Study Conclusions  - Left ventricle: The cavity size was mildly dilated. Wall   thickness was increased in a pattern of mild LVH. Systolic   function was mildly to moderately reduced. The estimated ejection   fraction was in the range of 40% to 45%. There  is hypokinesis of   the inferolateral and inferior myocardium. Doppler parameters are   consistent  with abnormal left ventricular relaxation (grade 1   diastolic dysfunction). - Aortic valve: Mildly calcified annulus. Trileaflet. - Mitral valve: There was trivial regurgitation. - Left atrium: The atrium was mildly to moderately dilated. - Right atrium: Central venous pressure (est): 3 mm Hg. - Atrial septum: No defect or patent foramen ovale was identified. - Tricuspid valve: There was trivial regurgitation. - Pulmonary arteries: Systolic pressure could not be accurately   estimated. - Pericardium, extracardiac: There was no pericardial effusion.  Today's assessment: See progress note same day.  Discharge Instructions  Discharge Instructions    Diet - low sodium heart healthy   Complete by:  As directed    Discharge instructions   Complete by:  As directed    Call your physician or seek immediate medical attention for weakness, falls, dizziness, passing out or worsening of condition. Make slow position changes if dizzy.   Increase activity slowly   Complete by:  As directed      Allergies as of 05/09/2018      Reactions   Cymbalta [duloxetine Hcl] Nausea And Vomiting, Other (See Comments)   PATIENT STATED THAT HE FELT LIKE HE "WAS GOING TO DIE" Rapid drop in blood pressure; sent him to the ER X2   Neurontin [gabapentin] Other (See Comments)   "Makes me goofy, keeps me off balance, clouds my thinking.."   Keppra [levetiracetam] Nausea And Vomiting      Medication List    TAKE these medications   amiodarone 200 MG tablet Commonly known as:  PACERONE TAKE 1 TABLET BY MOUTH ONCE DAILY   amitriptyline 25 MG tablet Commonly known as:  ELAVIL Take 75 mg by mouth at bedtime.   atorvastatin 10 MG tablet Commonly known as:  LIPITOR Take 10 mg by mouth daily.   Co Q 10 100 MG Caps Take 100 mg by mouth daily.   diazepam 5 MG tablet Commonly known as:  VALIUM Take 5 mg by mouth at bedtime as needed (leg cramps/sleep).   finasteride 1 MG tablet Commonly known as:   PROPECIA Take 1 mg by mouth daily as needed (for hair loss).   ibuprofen 600 MG tablet Commonly known as:  ADVIL,MOTRIN Take 1 tablet (600 mg total) by mouth every 6 (six) hours as needed.   JARDIANCE 10 MG Tabs tablet Generic drug:  empagliflozin Take 10 mg by mouth daily with breakfast.   metFORMIN 500 MG 24 hr tablet Commonly known as:  GLUCOPHAGE-XR Take 500-1,000 mg by mouth See admin instructions. 1,000 mg in the morning and 500 mg in the evening   metoprolol tartrate 50 MG tablet Commonly known as:  LOPRESSOR TAKE 1 TABLET BY MOUTH TWICE DAILY   omeprazole 20 MG capsule Commonly known as:  PRILOSEC Take 20 mg by mouth daily.   ONE-A-DAY MENS 50+ ADVANTAGE Tabs Take 1 tablet by mouth daily.   oxyCODONE-acetaminophen 5-325 MG tablet Commonly known as:  PERCOCET/ROXICET Take 1 tablet by mouth every 6 (six) hours as needed (pain).   ramipril 10 MG capsule Commonly known as:  ALTACE Take 10 mg by mouth daily.   SALONPAS EX Apply 1 application topically every 4 (four) hours as needed (for nerve pain). FORMULATION WITH LIDOCAINE   sildenafil 20 MG tablet Commonly known as:  REVATIO Take 20-100 mg by mouth daily as needed (for ED).   vitamin B-12 1000 MCG tablet Commonly known as:  CYANOCOBALAMIN  Take 1,000 mcg by mouth daily.   XARELTO 20 MG Tabs tablet Generic drug:  rivaroxaban Take 20 mg by mouth daily with supper.      Allergies  Allergen Reactions  . Cymbalta [Duloxetine Hcl] Nausea And Vomiting and Other (See Comments)    PATIENT STATED THAT HE FELT LIKE HE "WAS GOING TO DIE" Rapid drop in blood pressure; sent him to the ER X2  . Neurontin [Gabapentin] Other (See Comments)    "Makes me goofy, keeps me off balance, clouds my thinking.."  . Keppra [Levetiracetam] Nausea And Vomiting    The results of significant diagnostics from this hospitalization (including imaging, microbiology, ancillary and laboratory) are listed below for reference.     Significant Diagnostic Studies: Ct Abdomen Pelvis W Contrast  Result Date: 04/29/2018 CLINICAL DATA:  Abdominal distension and right flank pain for several weeks, initial encounter EXAM: CT ABDOMEN AND PELVIS WITH CONTRAST TECHNIQUE: Multidetector CT imaging of the abdomen and pelvis was performed using the standard protocol following bolus administration of intravenous contrast. CONTRAST:  120mL OMNIPAQUE IOHEXOL 300 MG/ML  SOLN COMPARISON:  None. FINDINGS: Lower chest: No acute abnormality. Hepatobiliary: No focal liver abnormality is seen. Status post cholecystectomy. No biliary dilatation. Pancreas: Unremarkable. No pancreatic ductal dilatation or surrounding inflammatory changes. Spleen: Normal in size without focal abnormality. Adrenals/Urinary Tract: Adrenal glands are within normal limits. Scarring is noted in the lower pole of the left kidney with nonobstructing left renal stone stable from the prior exam. Renal vascular calcifications are seen. Cystic changes are noted within the right kidney. The dominant cyst measures 4.2 cm increased in size from the prior exam. No complicating factors are seen. No obstructive changes are noted. The bladder is well distended. Stomach/Bowel: The appendix is within normal limits. No obstructive or inflammatory changes of the bowel are noted. Vascular/Lymphatic: Atherosclerotic changes of the abdominal aorta are noted. Some mild dilatation of the infrarenal aorta to 3.4 cm is noted. Changes consistent with prior abdominal aortic aneurysm repair are noted. The graft is patent. No significant enlargement in the treated aneurysm sac is noted. Reproductive: Prostate is unremarkable. Other: No abdominal wall hernia or abnormality. No abdominopelvic ascites. Musculoskeletal: Degenerative changes of the lumbar spine are seen. Postoperative changes are seen from L3-L5. IMPRESSION: Changes consistent with prior open AAA repair. Patent graft is noted. Slight aneurysmal  dilatation of the infrarenal aorta above the graft is noted to 3.4 cm. Chronic changes as described above without acute abnormality. Electronically Signed   By: Inez Catalina M.D.   On: 04/29/2018 12:50   US Aorta  Result Date: 05/08/2018 CLINICAL DATA:  History of abdominal aortic aneurysm repair in 2009. EXAM: ULTRASOUND OF ABDOMINAL AORTA TECHNIQUE: Ultrasound examination of the abdominal aorta was performed to evaluate for abdominal aortic aneurysm. COMPARISON:  CT 04/29/2018.  Abdominal ultrasound 10/18/2009. FINDINGS: Abdominal aortic measurements as follows: Proximal:  2.8 cm Mid:  2.9 cm Distal:  3.1 cm The iliac arteries are obscured by bowel gas. The remainder of the examination was also limited by bowel gas, and Doppler evaluation could not be performed. IMPRESSION: No recurrent abdominal aortic aneurysm identified status post abdominal aortic aneurysm repair. Examination is mildly limited by bowel gas, and the iliac arteries are not visualized. Electronically Signed   By: Richardean Sale M.D.   On: 05/08/2018 19:34   Dg Chest Port 1 View  Result Date: 05/08/2018 CLINICAL DATA:  Near syncope EXAM: PORTABLE CHEST 1 VIEW COMPARISON:  02/04/2018 FINDINGS: Artifact overlies the chest. Heart size  is normal. Chronic aortic atherosclerosis. Chronic pulmonary scarring. No evidence of active infiltrate, mass, effusion or collapse. No pulmonary edema. IMPRESSION: No active disease.  Aortic atherosclerosis.  Pulmonary scarring. Electronically Signed   By: Nelson Chimes M.D.   On: 05/08/2018 16:10    Labs: Basic Metabolic Panel: Recent Labs  Lab 05/08/18 1550 05/09/18 0422  NA 140 139  K 4.4 4.3  CL 104 104  CO2 26 23  GLUCOSE 212* 185*  BUN 28* 30*  CREATININE 1.53* 1.24  CALCIUM 9.5 9.0   Liver Function Tests: Recent Labs  Lab 05/08/18 1550  AST 49*  ALT 59*  ALKPHOS 58  BILITOT 1.0  PROT 6.7  ALBUMIN 3.9   CBC: Recent Labs  Lab 05/08/18 1550  WBC 6.8  NEUTROABS 4.6  HGB  15.3  HCT 47.1  MCV 94.8  PLT 148*   Cardiac Enzymes: Recent Labs  Lab 05/08/18 1550 05/08/18 2135 05/09/18 0422 05/09/18 1002  TROPONINI <0.03 <0.03 <0.03 <0.03    Recent Labs    05/08/18 1550  BNP 135.7*   CBG: Recent Labs  Lab 05/08/18 2137 05/09/18 0534  GLUCAP 152* 193*    Principal Problem:   Postural dizziness with presyncope Active Problems:   DM (diabetes mellitus), type 2, uncontrolled w/neurologic complication (HCC)   Paroxysmal atrial fibrillation with RVR (HCC)   Chronic combined systolic and diastolic heart failure ([place])   Hypotension   Time coordinating discharge: 35 minutes  Signed:  Murray Hodgkins, MD [facility] 05/09/2018, 4:52 PM

## 2018-05-09 NOTE — Progress Notes (Signed)
  Echocardiogram 2D Echocardiogram has been performed.  [name] 05/09/2018, 2:33 PM

## 2018-05-09 NOTE — Care Management Obs Status (Signed)
[facility] NOTIFICATION   Patient Details  Name: [name] MRN: [id number] Date of Birth: 1941-10-02   Medicare Observation Status Notification Given:  Yes    Sharin Mons, RN 05/09/2018, 11:42 AM

## 2018-05-20 ENCOUNTER — Ambulatory Visit: Payer: Medicare Other | Admitting: Interventional Cardiology

## 2018-06-02 ENCOUNTER — Ambulatory Visit (INDEPENDENT_AMBULATORY_CARE_PROVIDER_SITE_OTHER)
Admission: RE | Admit: 2018-06-02 | Discharge: 2018-06-02 | Disposition: A | Discharge status: Home / Self Care | Payer: Medicare Other | Source: Ambulatory Visit | Attending: Nurse Practitioner | Admitting: Nurse Practitioner

## 2018-06-02 ENCOUNTER — Encounter

## 2018-06-02 ENCOUNTER — Ambulatory Visit: Payer: Medicare Other | Admitting: Nurse Practitioner

## 2018-06-02 ENCOUNTER — Encounter: Payer: Self-pay | Admitting: Nurse Practitioner

## 2018-06-02 VITALS — BP 100/60 | HR 60 | Ht 72.0 in | Wt 204.4 lb

## 2018-06-02 DIAGNOSIS — I10 Essential (primary) hypertension: Secondary | ICD-10-CM | POA: Diagnosis not present

## 2018-06-02 DIAGNOSIS — I2510 Atherosclerotic heart disease of native coronary artery without angina pectoris: Secondary | ICD-10-CM | POA: Diagnosis not present

## 2018-06-02 DIAGNOSIS — I951 Orthostatic hypotension: Secondary | ICD-10-CM | POA: Diagnosis not present

## 2018-06-02 DIAGNOSIS — I5042 Chronic combined systolic (congestive) and diastolic (congestive) heart failure: Secondary | ICD-10-CM

## 2018-06-02 DIAGNOSIS — G1220 Motor neuron disease, unspecified: Secondary | ICD-10-CM

## 2018-06-02 DIAGNOSIS — Z79899 Other long term (current) drug therapy: Secondary | ICD-10-CM

## 2018-06-02 LAB — HEPATIC FUNCTION PANEL
ALT: 36 IU/L (ref 0–44)
AST: 25 IU/L (ref 0–40)
Albumin: 3.9 g/dL (ref 3.5–4.8)
Alkaline Phosphatase: 66 IU/L (ref 39–117)
Bilirubin Total: 0.5 mg/dL (ref 0.0–1.2)
Bilirubin, Direct: 0.16 mg/dL (ref 0.00–0.40)
Total Protein: 6.2 g/dL (ref 6.0–8.5)

## 2018-06-02 LAB — BASIC METABOLIC PANEL
BUN/Creatinine Ratio: 21 (ref 10–24)
BUN: 24 mg/dL (ref 8–27)
CO2: 24 mmol/L (ref 20–29)
Calcium: 9.6 mg/dL (ref 8.6–10.2)
Chloride: 100 mmol/L (ref 96–106)
Creatinine, Ser: 1.12 mg/dL (ref 0.76–1.27)
GFR calc Af Amer: 72 mL/min/{1.73_m2} (ref >59)
GFR calc non Af Amer: 62 mL/min/{1.73_m2} (ref >59)
Glucose: 209 mg/dL — ABNORMAL HIGH (ref 65–99)
Potassium: 4.4 mmol/L (ref 3.5–5.2)
Sodium: 140 mmol/L (ref 134–144)

## 2018-06-02 LAB — TSH: TSH: 0.488 u[IU]/mL (ref 0.450–4.500)

## 2018-06-02 MED ORDER — RAMIPRIL 5 MG PO CAPS: 5.0000 mg | ORAL_CAPSULE | Freq: Every day | ORAL | 6 refills | Status: DC

## 2018-06-02 NOTE — Patient Instructions (Addendum)
We will be checking the following labs today - BMET, HPF, TSH    Medication Instructions:    Continue with your current medicines. BUT  I am cutting the Altace in half - take just 5 mg a day  Verify your medicine list when you get home to make sure correct    Testing/Procedures To Be Arranged:  CT of the head without contrast  Follow-Up:   See Dr. Tamala Julian soon.     Other Special Instructions:   N/A    If you need a refill on your cardiac medications before your next appointment, please call your pharmacy.   Call the [place] office at [phone] if you have any questions, problems or concerns.

## 2018-06-02 NOTE — Progress Notes (Signed)
CARDIOLOGY OFFICE NOTE  Date:  06/02/2018    [name] Date of Birth: 1941-01-31 Medical Record #[id number]  PCP:  [name], MD  Cardiologist:  Tamala Julian    Chief Complaint  Patient presents with  . Follow-up    Post hospital visit - seen for Dr. Tamala Julian    History of Present Illness: [name] is a 77 y.o. male who presents today for a follow up visit. Seen for Dr. Tamala Julian.   He has a history of CAD with prior LCX and LAD stenting, chronic combined systolic and diastolic heart failure with EF of 40 to 45%, DM, atrial fibrillation, chronic anticoagulation, and history of severe polyneuropathy involving the lower extremities.  He was last seen here in October by Dr. Tamala Julian - was felt to be doing well from a cardiac standpoint.   Hospitalized in July with near syncope/lightheadedness - treated with IV fluids. Due to history of AAA repair - this was imaged and unremarkable. Echo unchanged - EF still 40 to 45%. Sounds like dehydration was playing a role - does not appear that his medicines were changed.   Comes in today. Here with his wife. Tells me that the spell he had - he was sitting and had onset of feeling lightheaded, thought he would faint - BP down in the 70's by his machine and HR in the 50's - he had just had lunch while out and had several glasses of water - he had his medicines a few hours prior a well. Actually started feeling bad after getting in the car to head home from being out to lunch. No real further spells but does feel weak. He had been using CBD oil - escalating doses. But then had change in his taste buds - he has since stopped this. He was taking this for his neuropath - it did not help. He has continued to have multiple falls - last last week while in the shower. They both note more issues with "trying to find his words/some slurring". His taste buds have not returned. He remains on Xarelto.   Past Medical History:  Diagnosis Date  . AAA (abdominal aortic  aneurysm) ([place])    a. s/p repair 2009.  [name] Acid reflux    takes Prilosec and Protonix daily  . Arthritis    BacK  . CAD in native artery    a. a. prior inf MI 1993. b. PCI to LAD 05/1997. c. recurrent inferolateral MI complicated by V. fib arrest in 04/1998, prior PCI to OM1. d. 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  [name] Chronic combined systolic and diastolic CHF (congestive heart failure) ([place])   . Complication of anesthesia    -years ago hair fell out.;ileus after 2 of his surgeries  . Dry skin   . Foot drop, bilateral   . Hypercholesteremia   . Hypertension   . Ileus ([place])    After AAA  . Incisional hernia    "small, from AAA"  . Ischemic cardiomyopathy   . MI (myocardial infarction) ([place])   . 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 OSA on CPAP    uses CPAP--sleep study done at least 40yrs ago  . PAF (paroxysmal atrial fibrillation) ([place])   . Pain    back pain chronic- seen at pain clinic  . Plantar fasciitis    bilatetral  . Thrombocytopenia ([place])   . Type II diabetes mellitus [facility])     Past Surgical  History:  Procedure Laterality Date  . ABDOMINAL AORTIC ANEURYSM REPAIR    . BACK SURGERY  2012-2013 X 3   Miminal Invasive x 3in [place].  [name] CARDIAC CATHETERIZATION  2009/2012  . CARDIOVERSION N/A 04/12/2015   Procedure: CARDIOVERSION;  Surgeon: Sueanne Margarita, MD;  Location: [facility];  Service: Cardiovascular;  Laterality: N/A;  . CARDIOVERSION N/A 12/26/2016   Procedure: CARDIOVERSION;  Surgeon: Sueanne Margarita, MD;  [facility];  Service: Cardiovascular;  Laterality: N/A;  . COLONOSCOPY    . CORONARY ANGIOPLASTY WITH STENT PLACEMENT  1992; 1997  . LAPAROSCOPIC CHOLECYSTECTOMY    . LUMBAR LAMINECTOMY/DECOMPRESSION MICRODISCECTOMY Right 01/16/2016   Procedure: Right Lumbar five-Sacral one Laminectomy;  Surgeon: Kristeen Miss, MD;  Location: [place] [facility];  Service: Neurosurgery;  Laterality: Right;  Right L5-S1 Laminectomy     Medications: Current Meds  Medication Sig    . atorvastatin (LIPITOR) 10 MG tablet Take 10 mg by mouth daily.  . Coenzyme Q10 (CO Q 10) 100 MG CAPS Take 100 mg by mouth daily.  . diazepam (VALIUM) 5 MG tablet Take 5 mg by mouth at bedtime as needed (leg cramps/sleep).   . empagliflozin (JARDIANCE) 10 MG TABS tablet Take 10 mg by mouth daily with breakfast.   . metFORMIN (GLUCOPHAGE-XR) 500 MG 24 hr tablet Take 500-1,000 mg by mouth See admin instructions. 1,000 mg in the morning and 500 mg in the evening  . metoprolol tartrate (LOPRESSOR) 50 MG tablet TAKE 1 TABLET BY MOUTH TWICE DAILY  . Multiple Vitamins-Minerals (ONE-A-DAY MENS 50+ ADVANTAGE) TABS Take 1 tablet by mouth daily.  [name] omeprazole (PRILOSEC) 20 MG capsule Take 20 mg by mouth daily.  [name] oxyCODONE-acetaminophen (PERCOCET/ROXICET) 5-325 MG tablet Take 1 tablet by mouth every 6 (six) hours as needed (pain).   . rivaroxaban (XARELTO) 20 MG TABS tablet Take 20 mg by mouth daily with supper.  . vitamin B-12 (CYANOCOBALAMIN) 1000 MCG tablet Take 1,000 mcg by mouth daily.   . [DISCONTINUED] finasteride (PROPECIA) 1 MG tablet Take 1 mg by mouth daily as needed (for hair loss).  . [DISCONTINUED] Liniments (SALONPAS EX) Apply 1 application topically every 4 (four) hours as needed (for nerve pain). FORMULATION WITH LIDOCAINE  . [DISCONTINUED] ramipril (ALTACE) 10 MG capsule Take 10 mg by mouth daily.      Allergies: Allergies  Allergen Reactions  . Cymbalta [Duloxetine Hcl] Nausea And Vomiting and Other (See Comments)    PATIENT STATED THAT HE FELT LIKE HE "WAS GOING TO DIE" Rapid drop in blood pressure; sent him to the ER X2  . Neurontin [Gabapentin] Other (See Comments)    "Makes me goofy, keeps me off balance, clouds my thinking.."  . Keppra [Levetiracetam] Nausea And Vomiting    Social History: The patient  reports that he quit smoking about 26 years ago. His smoking use included cigarettes. He has a 33.00 pack-year smoking history. He has never used smokeless tobacco. He  reports that he drinks alcohol. He reports that he does not use drugs.   Family History: The patient's family history includes Arthritis in his father; Coronary artery disease in his father and mother; Diabetes in his father and mother; Rheum arthritis in his father.   Review of Systems: Please see the history of present illness.   Otherwise, the review of systems is positive for none.   All other systems are reviewed and negative.   Physical Exam: VS:  BP 100/60 (BP Location: Left Arm, Patient Position: Sitting, Cuff Size: Normal)  Pulse 60   Ht 6' (1.829 m)   Wt 204 lb 6.4 oz (92.7 kg)   SpO2 96% Comment: at rest  BMI 27.72 kg/m  .  BMI Body mass index is 27.72 kg/m.  Wt Readings from Last 3 Encounters:  06/02/18 204 lb 6.4 oz (92.7 kg)  05/09/18 199 lb 12.8 oz (90.6 kg)  10/19/17 199 lb (90.3 kg)   BP by me is 100/50  General:  Elderly. Looks chronically ill and older than his stated age but alert and in no acute distress.   HEENT: Normal.  Neck: Supple, no JVD, carotid bruits, or masses noted.  Cardiac: Regular rate and rhythm. Heart tones are distant. No edema.  Respiratory:  Lungs are clear to auscultation bilaterally with normal work of breathing.  GI: Soft and nontender.  MS: No deformity or atrophy. Gait and ROM intact. Moves slow and looks unsteady. Using a cane.   Skin: Warm and dry. Color is normal.  Neuro:  Strength and sensation are intact and no gross focal deficits noted. Some word searching noted during our exam.  Psych: Alert, appropriate and with normal affect.   LABORATORY DATA:  EKG:  EKG is not ordered today.   Lab Results  Component Value Date   WBC 6.8 05/08/2018   HGB 15.3 05/08/2018   HCT 47.1 05/08/2018   PLT 148 (L) 05/08/2018   GLUCOSE 185 (H) 05/09/2018   CHOL  11/26/2010    147        ATP III CLASSIFICATION:  <200     mg/dL   Desirable  200-239  mg/dL   Borderline High  >=240    mg/dL   High          TRIG 132 11/26/2010   HDL 29  (L) 11/26/2010   LDLCALC  11/26/2010    92        Total Cholesterol/HDL:CHD Risk Coronary Heart Disease Risk Table                     Men   Women  1/2 Average Risk   3.4   3.3  Average Risk       5.0   4.4  2 X Average Risk   9.6   7.1  3 X Average Risk  23.4   11.0        Use the calculated Patient Ratio above and the CHD Risk Table to determine the patient's CHD Risk.        ATP III CLASSIFICATION (LDL):  <100     mg/dL   Optimal  100-129  mg/dL   Near or Above                    Optimal  130-159  mg/dL   Borderline  160-189  mg/dL   High  >190     mg/dL   Very High   ALT 59 (H) 05/08/2018   AST 49 (H) 05/08/2018   NA 139 05/09/2018   K 4.3 05/09/2018   CL 104 05/09/2018   CREATININE 1.24 05/09/2018   BUN 30 (H) 05/09/2018   CO2 23 05/09/2018   TSH 0.746 12/24/2016   INR 1.11 01/12/2016   HGBA1C 6.8 (H) 06/12/2017       BNP (last 3 results) Recent Labs    05/08/18 1550  BNP 135.7*    ProBNP (last 3 results) No results for input(s): PROBNP in the last 8760 hours.   Other Studies Reviewed Today:  Echo Study Conclusions 04/2018  - Left ventricle: The cavity size was mildly dilated. Wall thickness was increased in a pattern of mild LVH. Systolic function was mildly to moderately reduced. The estimated ejection fraction was in the range of 40% to 45%. There is hypokinesis of the inferolateral and inferior myocardium. Doppler parameters are consistent with abnormal left ventricular relaxation (grade 1 diastolic dysfunction). - Aortic valve: Mildly calcified annulus. Trileaflet. - Mitral valve: There was trivial regurgitation. - Left atrium: The atrium was mildly to moderately dilated. - Right atrium: Central venous pressure (est): 3 mm Hg. - Atrial septum: No defect or patent foramen ovale was identified. - Tricuspid valve: There was trivial regurgitation. - Pulmonary arteries: Systolic pressure could not be accurately estimated. -  Pericardium, extracardiac: There was no pericardial effusion.   Assessment/Plan:  1. Recent admission for near syncope - ?orthostasis/dehydration - may have been related to the CBD oil augmenting his beta blocker. Still with generalized weakness. I think he needs a CT of the head as well - his taste buds have not improved - he is having speech issues and he has had multiple falls.   2. Chronic combined systolic and diastolic HF - recent echo with EF still at 40 to 45% - I am cutting the Altace back today - while not ideal - I think his BP needs to be higher at this time.   3. CAD - prior PCI - no active chest pain - would favor medical management.   4. HTN - BP pretty low - generalized weakness - cutting back ACE today.   5. PAF - in sinus - on amiodarone  6. HLD - not discussed  7. High risk medicine - needs labs today.   8. Chronic anticoagulation- worrisome with the continued falls - this may need to be stopped. I am checking a CT of the head. He may need to see neurology as well.   9. Severe neuropathy - seems progressive with limited options. I would avoid all supplements going forward.   Current medicines are reviewed with the patient today.  The patient does not have concerns regarding medicines other than what has been noted above.  The following changes have been made:  See above.  Labs/ tests ordered today include:    Orders Placed This Encounter  Procedures  . CT Head Wo Contrast     Disposition:   FU with Dr. Tamala Julian as planned in October.  Patient is agreeable to this plan and will call if any problems develop in the interim.   SignedTruitt Merle, NP  06/02/2018 9:48 AM  [facility] [address] [place], [place]  [address] Phone: [phone] Fax: [phone]

## 2018-07-15 NOTE — Progress Notes (Signed)
Cardiology Office Note:    Date:  07/16/2018   ID:  [name], DOB 16-Aug-1941, MRN [id number]  PCP:  [name], MD  Cardiologist:  Sinclair Grooms, MD   Referring MD: [name], MD   Chief Complaint  Patient presents with  . Coronary Artery Disease  . Atrial Fibrillation    History of Present Illness:    [name] is a 77 y.o. male with a hx of for coronary artery disease, chronic combined systolic and diastolic heart failure, prior circumflex, LAD stenting, diabetes mellitus, atrial fibrillation, chronic anticoagulation, and history of severe polyneuropathy involving the lower extremities.  Is doing well.  He denies angina.  Has an apple watch and is not identified any episodes of irregular heartbeat and has had no symptoms of atrial fibrillation.  He is compliant with medical therapy.  He does have bilateral trace to 1+ ankle edema.  He has not had syncope.   Past Medical History:  Diagnosis Date  . AAA (abdominal aortic aneurysm) ([place])    a. s/p repair 2009.  [name] Acid reflux    takes Prilosec and Protonix daily  . Arthritis    BacK  . CAD in native artery    a. a. prior inf MI 1993. b. PCI to LAD 05/1997. c. recurrent inferolateral MI complicated by V. fib arrest in 04/1998, prior PCI to OM1. d. 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  [name] Chronic combined systolic and diastolic CHF (congestive heart failure) ([place])   . Complication of anesthesia    -years ago hair fell out.;ileus after 2 of his surgeries  . Dry skin   . Foot drop, bilateral   . Hypercholesteremia   . Hypertension   . Ileus ([place])    After AAA  . Incisional hernia    "small, from AAA"  . Ischemic cardiomyopathy   . MI (myocardial infarction) ([place])   . 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 OSA on CPAP    uses CPAP--sleep study done at least 82yrs ago  . PAF (paroxysmal atrial fibrillation) ([place])   . Pain    back pain chronic- seen at pain clinic  . Plantar fasciitis    bilatetral  . Thrombocytopenia  ([place])   . Type II diabetes mellitus ([place])     Past Surgical History:  Procedure Laterality Date  . ABDOMINAL AORTIC ANEURYSM REPAIR    . BACK SURGERY  2012-2013 X 3   Miminal Invasive x 3in [place].  [name] CARDIAC CATHETERIZATION  2009/2012  . CARDIOVERSION N/A 04/12/2015   Procedure: CARDIOVERSION;  Surgeon: Sueanne Margarita, MD;  Location: [facility];  Service: Cardiovascular;  Laterality: N/A;  . CARDIOVERSION N/A 12/26/2016   Procedure: CARDIOVERSION;  Surgeon: Sueanne Margarita, MD;  [facility];  Service: Cardiovascular;  Laterality: N/A;  . COLONOSCOPY    . CORONARY ANGIOPLASTY WITH STENT PLACEMENT  1992; 1997  . LAPAROSCOPIC CHOLECYSTECTOMY    . LUMBAR LAMINECTOMY/DECOMPRESSION MICRODISCECTOMY Right 01/16/2016   Procedure: Right Lumbar five-Sacral one Laminectomy;  Surgeon: Kristeen Miss, MD;  Location: [place] [facility];  Service: Neurosurgery;  Laterality: Right;  Right L5-S1 Laminectomy    Current Medications: Current Meds  Medication Sig  . amiodarone (PACERONE) 200 MG tablet TAKE 1 TABLET BY MOUTH ONCE DAILY  . atorvastatin (LIPITOR) 10 MG tablet Take 10 mg by mouth daily.  . Coenzyme Q10 (CO Q 10) 100 MG CAPS Take 100 mg by mouth daily.  . diazepam (VALIUM) 5  MG tablet Take 5 mg by mouth at bedtime as needed (leg cramps/sleep).   . empagliflozin (JARDIANCE) 10 MG TABS tablet Take 10 mg by mouth daily with breakfast.   . metFORMIN (GLUCOPHAGE-XR) 500 MG 24 hr tablet Take 500-1,000 mg by mouth See admin instructions. 1,000 mg in the morning and 500 mg in the evening  . metoprolol tartrate (LOPRESSOR) 50 MG tablet TAKE 1 TABLET BY MOUTH TWICE DAILY  . Multiple Vitamins-Minerals (ONE-A-DAY MENS 50+ ADVANTAGE) TABS Take 1 tablet by mouth daily.  [name] omeprazole (PRILOSEC) 20 MG capsule Take 20 mg by mouth daily.  [name] oxyCODONE-acetaminophen (PERCOCET/ROXICET) 5-325 MG tablet Take 1 tablet by mouth every 6 (six) hours as needed (pain).   . ramipril (ALTACE) 5 MG capsule Take 1 capsule  (5 mg total) by mouth daily.  . rivaroxaban (XARELTO) 20 MG TABS tablet Take 20 mg by mouth daily with supper.  . vitamin B-12 (CYANOCOBALAMIN) 1000 MCG tablet Take 1,000 mcg by mouth daily.      Allergies:   Cymbalta [duloxetine hcl]; Neurontin [gabapentin]; and Keppra [levetiracetam]   Social History   Socioeconomic History  . Marital status: Married    Spouse name: Not on file  . Number of children: Not on file  . Years of education: Not on file  . Highest education level: Not on file  Occupational History  . Not on file  Social Needs  . Financial resource strain: Not on file  . Food insecurity:    Worry: Not on file    Inability: Not on file  . Transportation needs:    Medical: Not on file    Non-medical: Not on file  Tobacco Use  . Smoking status: Former Smoker    Packs/day: 1.00    Years: 33.00    Pack years: 33.00    Types: Cigarettes    Last attempt to quit: 01/01/1992    Years since quitting: 26.5  . Smokeless tobacco: Never Used  Substance and Sexual Activity  . Alcohol use: Yes    Comment: 6/30;2017 "GLASS OF WINE Q COUPLE MONTHS, IF THAT"  . Drug use: No  . Sexual activity: Yes  Lifestyle  . Physical activity:    Days per week: Not on file    Minutes per session: Not on file  . Stress: Not on file  Relationships  . Social connections:    Talks on phone: Not on file    Gets together: Not on file    Attends religious service: Not on file    Active member of club or organization: Not on file    Attends meetings of clubs or organizations: Not on file    Relationship status: Not on file  Other Topics Concern  . Not on file  Social History Narrative  . Not on file     Family History: The patient's family history includes Arthritis in his father; Coronary artery disease in his father and mother; Diabetes in his father and mother; Rheum arthritis in his father.  ROS:   Please see the history of present illness.    Bilateral lower extremity neuropathy  and pain.  All other systems reviewed and are negative.  EKGs/Labs/Other Studies Reviewed:    The following studies were reviewed today: None  EKG:  EKG is not ordered today.   Recent Labs: 05/08/2018: B Natriuretic Peptide 135.7; Hemoglobin 15.3; Platelets 148 06/02/2018: ALT 36; BUN 24; Creatinine, Ser 1.12; Potassium 4.4; Sodium 140; TSH 0.488  Recent Lipid Panel  Component Value Date/Time   CHOL  11/26/2010 0750    147        ATP III CLASSIFICATION:  <200     mg/dL   Desirable  200-239  mg/dL   Borderline High  >=240    mg/dL   High          TRIG 132 11/26/2010 0750   HDL 29 (L) 11/26/2010 0750   CHOLHDL 5.1 11/26/2010 0750   VLDL 26 11/26/2010 0750   LDLCALC  11/26/2010 0750    92        Total Cholesterol/HDL:CHD Risk Coronary Heart Disease Risk Table                     Men   Women  1/2 Average Risk   3.4   3.3  Average Risk       5.0   4.4  2 X Average Risk   9.6   7.1  3 X Average Risk  23.4   11.0        Use the calculated Patient Ratio above and the CHD Risk Table to determine the patient's CHD Risk.        ATP III CLASSIFICATION (LDL):  <100     mg/dL   Optimal  100-129  mg/dL   Near or Above                    Optimal  130-159  mg/dL   Borderline  160-189  mg/dL   High  >190     mg/dL   Very High    Physical Exam:    VS:  BP (!) 108/56   Pulse 73   Ht 6\' 1"  (1.854 m)   Wt 213 lb 1.9 oz (96.7 kg)   BMI 28.12 kg/m     Wt Readings from Last 3 Encounters:  07/16/18 213 lb 1.9 oz (96.7 kg)  06/02/18 204 lb 6.4 oz (92.7 kg)  05/09/18 199 lb 12.8 oz (90.6 kg)     GEN: Elderly.  Well nourished, well developed in no acute distress HEENT: Normal NECK: No JVD. LYMPHATICS: No lymphadenopathy CARDIAC: RRR, no murmur, no gallop, no edema. VASCULAR: Bilateral 2+ radial pulses.  No bruits. RESPIRATORY:  Clear to auscultation without rales, wheezing or rhonchi  ABDOMEN: Soft, non-tender, non-distended, No pulsatile mass, MUSCULOSKELETAL: No deformity    SKIN: Warm and dry NEUROLOGIC:  Alert and oriented x 3 PSYCHIATRIC:  Normal affect   ASSESSMENT:    1. Chronic combined systolic and diastolic heart failure (HCC)   2. Paroxysmal atrial fibrillation with RVR ([place])   3. On amiodarone therapy   4. Other hyperlipidemia   5. Essential hypertension   6. Abdominal aortic aneurysm (AAA) without rupture ([place])   7. Coronary artery disease involving native coronary artery of native heart with angina pectoris ([place])    PLAN:    In order of problems listed above:  1. No evidence of volume overload other than ankle edema.  No JVD is noted.  No change in diuretic regimen. 2. Maintaining sinus rhythm.  We checked his rhythm on his apple watch and there was evidence of clear P waves with a regular rhythm. 3. Liver panel and TSH in 6 months.  Most recent values were within normal limits. 4. Target LDL less than 70.  Most recent LDL was 31 on statin therapy. 5. Target blood pressure 130/80 mmHg. 6. Continue risk factor modification as outlined above. 7. Asymptomatic without angina.  Targets are listed above.  Goal hemoglobin A1c less than 7.  Already on SGL T2 therapy.  Clinical follow-up in 6 months.  TSH and hepatic panel in 6 months.   Medication Adjustments/Labs and Tests Ordered: Current medicines are reviewed at length with the patient today.  Concerns regarding medicines are outlined above.  No orders of the defined types were placed in this encounter.  No orders of the defined types were placed in this encounter.   Patient Instructions  Medication Instructions:  Your physician recommends that you continue on your current medications as directed. Please refer to the Current Medication list given to you today.  Labwork: None  Testing/Procedures: None  Follow-Up: Your physician wants you to follow-up in: 6 months with Dr. Tamala Julian.  You will receive a reminder letter in the mail two months in advance. If you don't receive a letter,  please call our office to schedule the follow-up appointment.   Any Other Special Instructions Will Be Listed Below (If Applicable).     If you need a refill on your cardiac medications before your next appointment, please call your pharmacy.      Signed, Sinclair Grooms, MD  07/16/2018 4:56 PM    [facility]

## 2018-07-16 ENCOUNTER — Encounter: Payer: Self-pay | Admitting: Interventional Cardiology

## 2018-07-16 ENCOUNTER — Ambulatory Visit: Payer: Medicare Other | Admitting: Interventional Cardiology

## 2018-07-16 VITALS — BP 108/56 | HR 73 | Ht 73.0 in | Wt 213.1 lb

## 2018-07-16 DIAGNOSIS — I480 Paroxysmal atrial fibrillation: Secondary | ICD-10-CM

## 2018-07-16 DIAGNOSIS — Z79899 Other long term (current) drug therapy: Secondary | ICD-10-CM | POA: Diagnosis not present

## 2018-07-16 DIAGNOSIS — I25119 Atherosclerotic heart disease of native coronary artery with unspecified angina pectoris: Secondary | ICD-10-CM

## 2018-07-16 DIAGNOSIS — I5042 Chronic combined systolic (congestive) and diastolic (congestive) heart failure: Secondary | ICD-10-CM | POA: Diagnosis not present

## 2018-07-16 DIAGNOSIS — I714 Abdominal aortic aneurysm, without rupture, unspecified: Secondary | ICD-10-CM

## 2018-07-16 DIAGNOSIS — E7849 Other hyperlipidemia: Secondary | ICD-10-CM | POA: Diagnosis not present

## 2018-07-16 DIAGNOSIS — I10 Essential (primary) hypertension: Secondary | ICD-10-CM

## 2018-07-16 NOTE — Patient Instructions (Signed)
Medication Instructions:  Your physician recommends that you continue on your current medications as directed. Please refer to the Current Medication list given to you today.  Labwork: None  Testing/Procedures: None  Follow-Up: Your physician wants you to follow-up in: 6 months with Dr. Smith.  You will receive a reminder letter in the mail two months in advance. If you don't receive a letter, please call our office to schedule the follow-up appointment.   Any Other Special Instructions Will Be Listed Below (If Applicable).     If you need a refill on your cardiac medications before your next appointment, please call your pharmacy.   

## 2018-07-28 IMAGING — CR DG CHEST 1V PORT
1 series · 1 of 1 positions shown · non-contrast
Comparison: 04/26/16

CLINICAL DATA: Nausea and vomiting.

EXAM:
PORTABLE CHEST 1 VIEW

[AP]
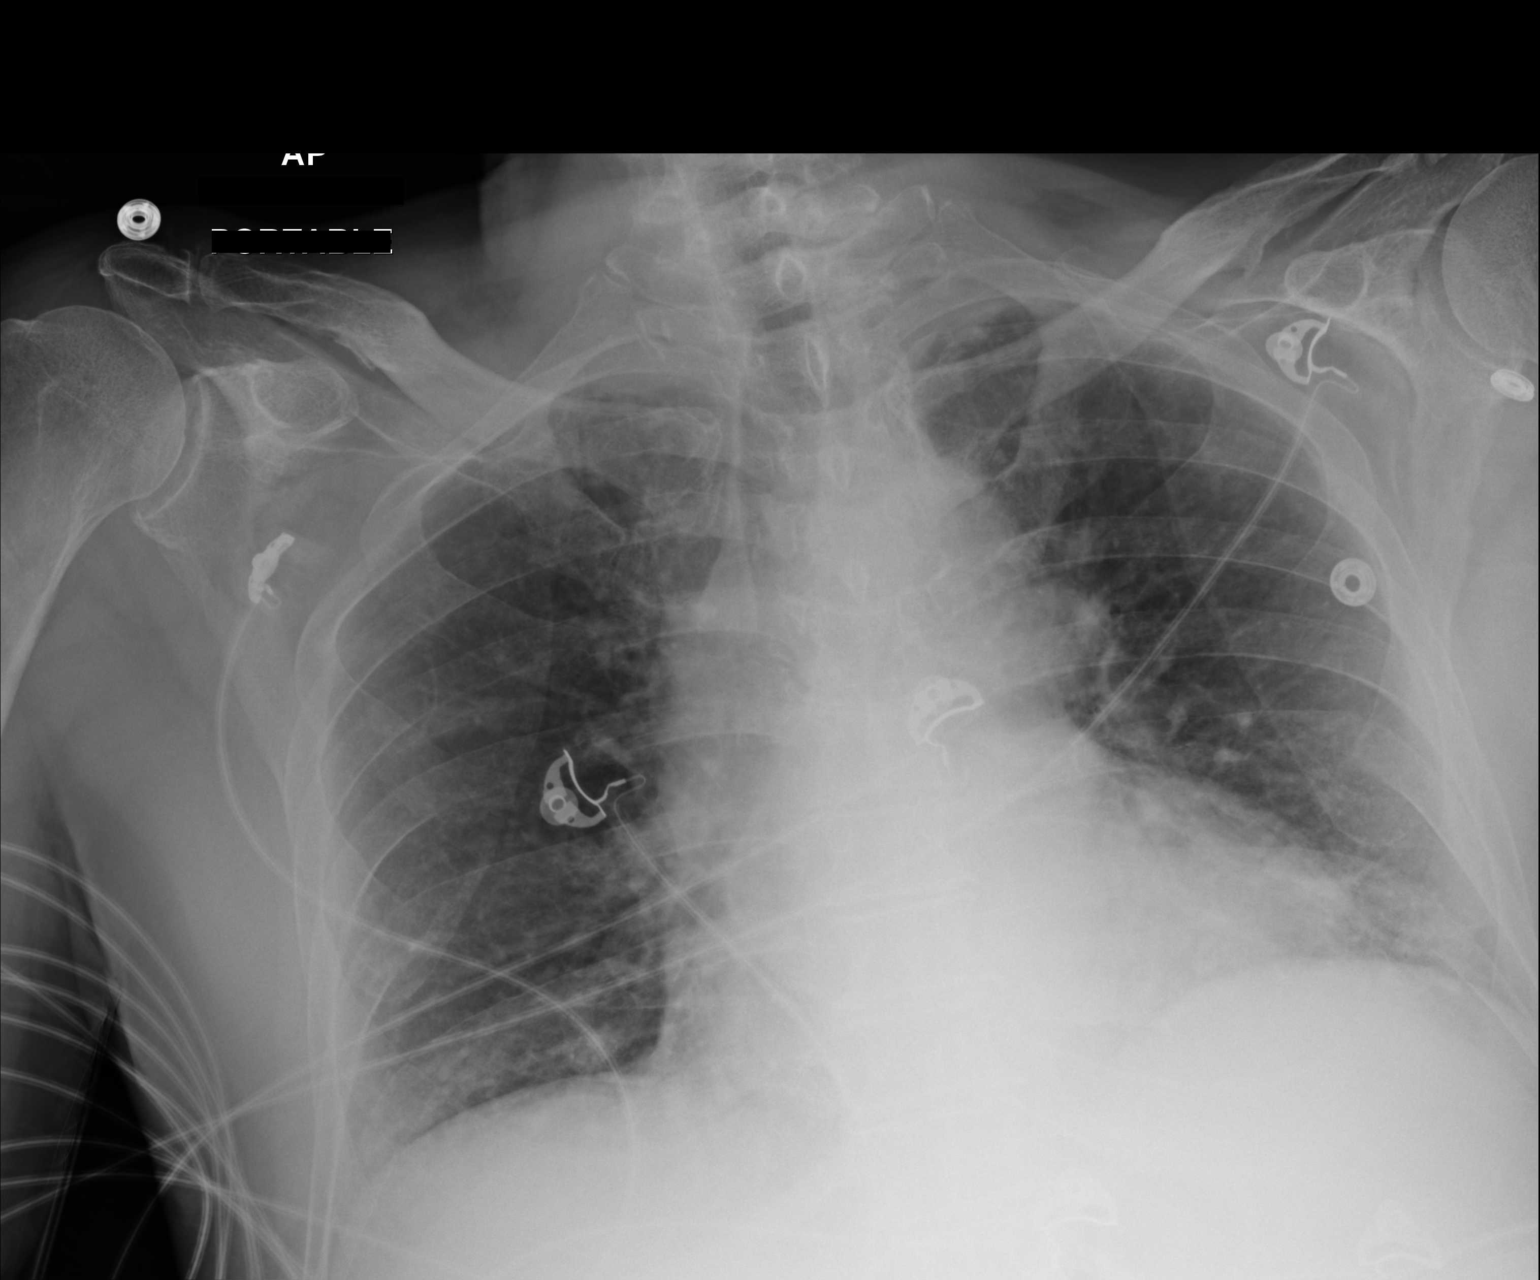

[1 of 1 positions shown; findings below may reference images not displayed]

FINDINGS: Mild cardiac enlargement. Lung volumes are low. Atelectasis is
identified within the left lower lobe. No airspace opacities
identified.
IMPRESSION: 1. Left lower lobe atelectasis

## 2018-07-29 IMAGING — CR DG CHEST 1V PORT
1 series · 1 of 1 positions shown · non-contrast
Comparison: Chest radiograph December 22, 2016 at 2898 hours

CLINICAL DATA: Febrile, hypoxemia. History of hypertension and
diabetes.

EXAM:
PORTABLE CHEST 1 VIEW

[AP]
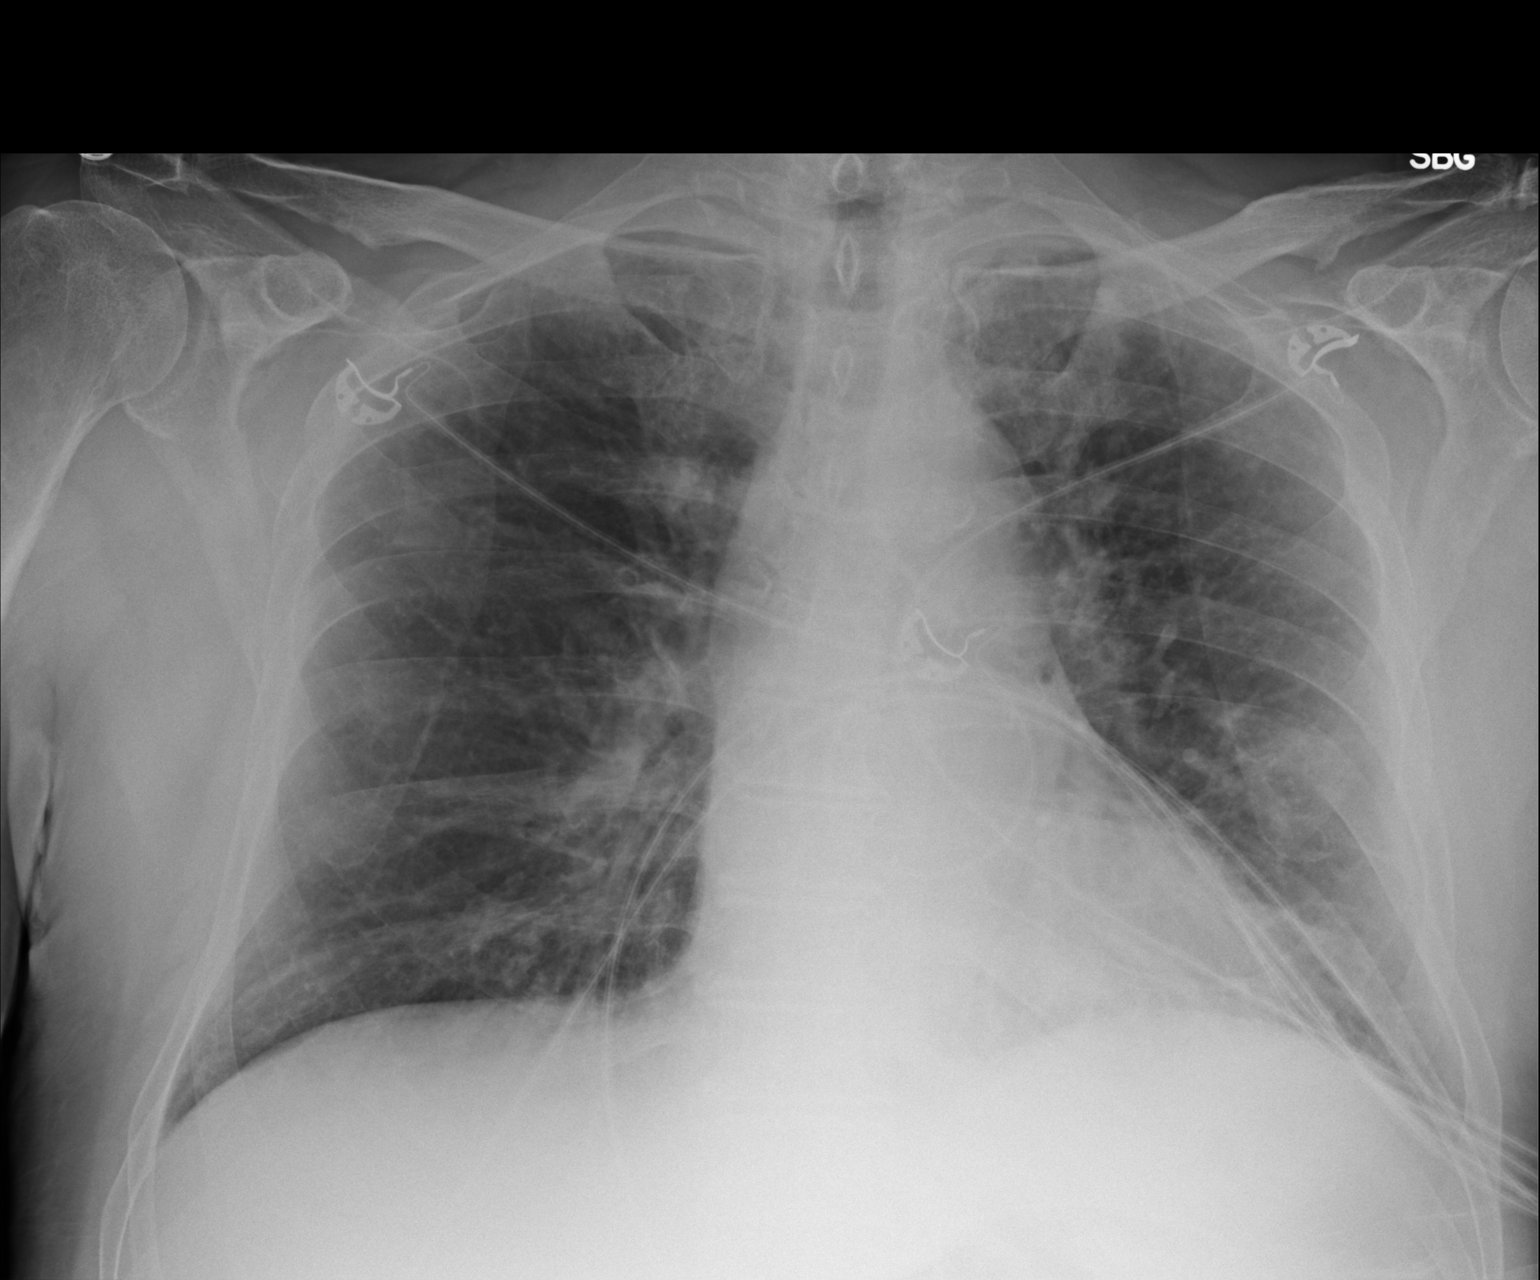

[1 of 1 positions shown; findings below may reference images not displayed]

FINDINGS: Cardiomediastinal silhouette is normal. Mildly calcified aortic
knob. Patchy LEFT lung base airspace opacities are less conspicuous
than prior radiograph. No pleural effusion. No pneumothorax. Soft
tissue planes and included osseous structures are unchanged.
IMPRESSION: LEFT lung base atelectasis versus pneumonia.

## 2018-07-29 IMAGING — CR DG CHEST 1V PORT SAME DAY
1 series · 1 of 1 positions shown · non-contrast
Comparison: Prior radiograph from 12/21/2016.

CLINICAL DATA: Initial evaluation for acute hypoxia.

EXAM:
PORTABLE CHEST 1 VIEW

[AP]
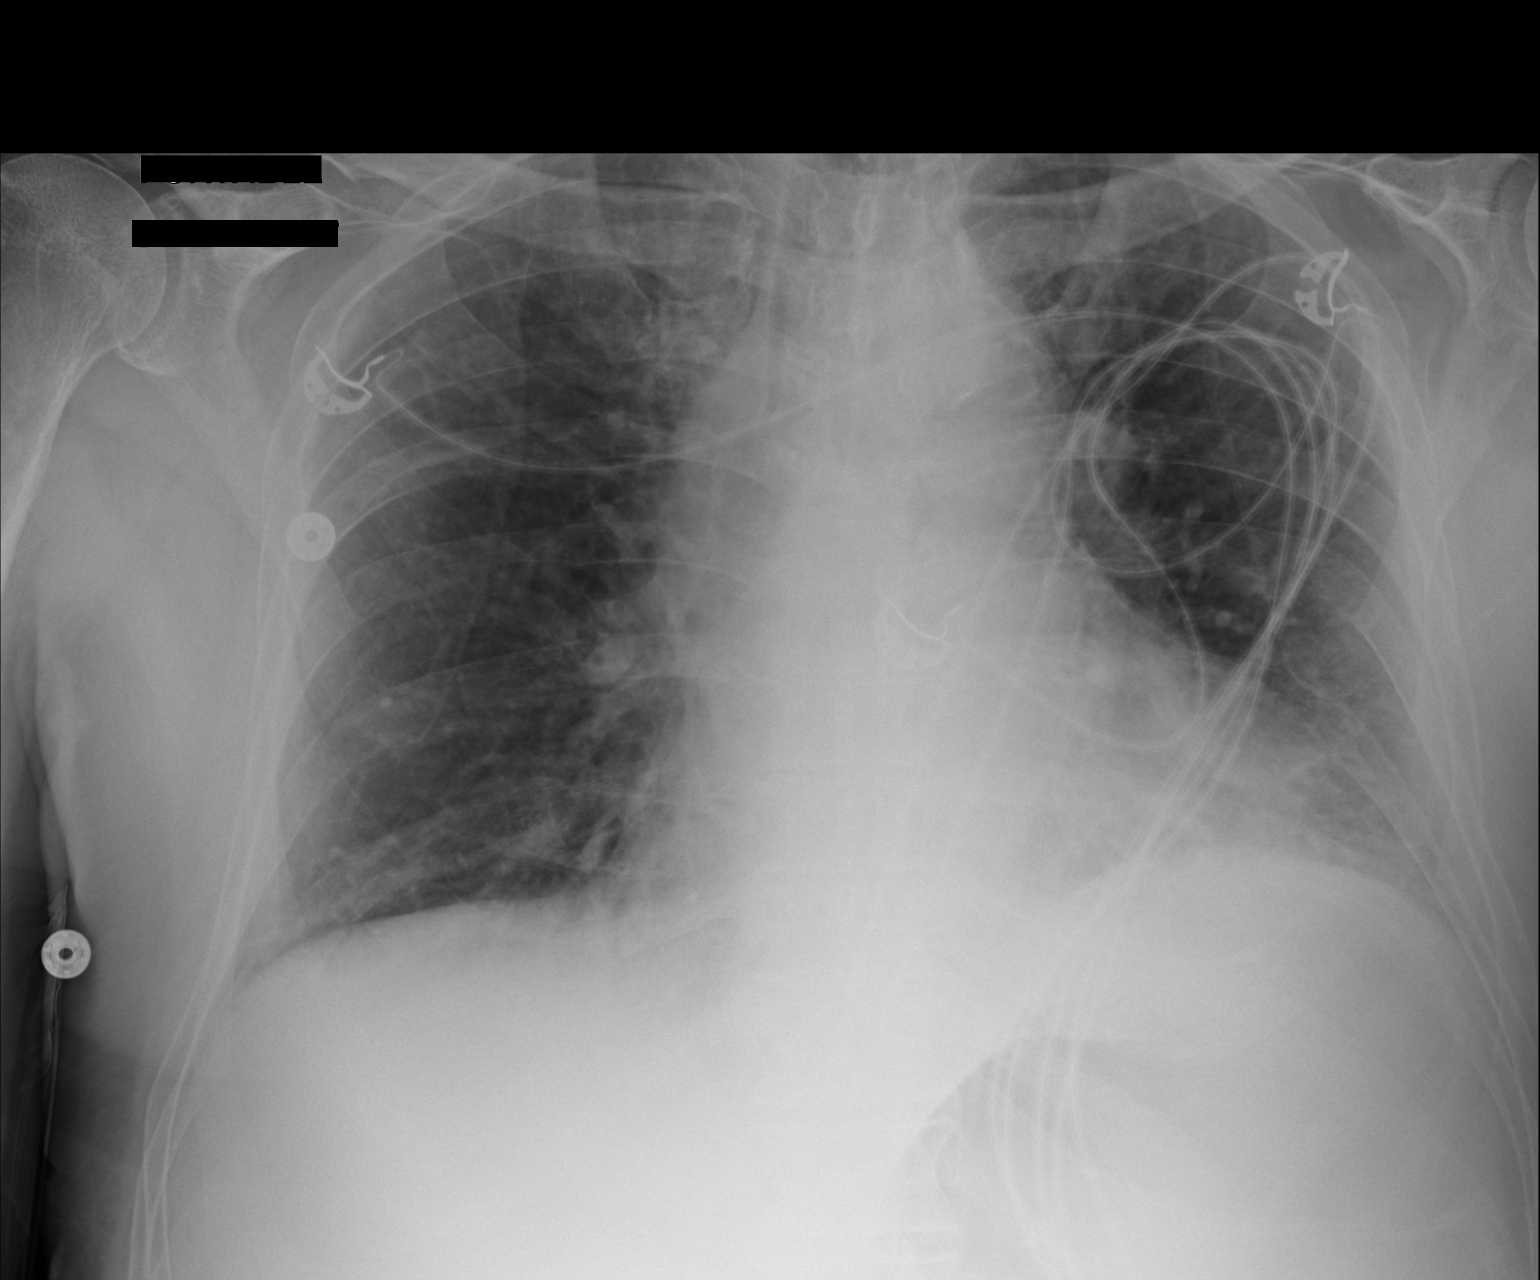

[1 of 1 positions shown; findings below may reference images not displayed]

FINDINGS: Moderate cardiomegaly. Mediastinal silhouette within normal limits.
Aortic atherosclerosis.

Lungs hypoinflated. Persistent patchy opacity at the left lung base,
either atelectasis or infiltrate. Mild perihilar vascular congestion
without overt pulmonary edema. No pleural effusion. No pneumothorax.

Osseous structures unchanged.
IMPRESSION: 1. Persistent patchy left basilar opacity, which may reflect
atelectasis and/or infiltrate.
2. Stable cardiomegaly with mild perihilar vascular congestion
without overt pulmonary edema.
3. Aortic atherosclerosis.

## 2019-01-12 ENCOUNTER — Other Ambulatory Visit: Payer: Self-pay | Admitting: Interventional Cardiology

## 2019-01-16 IMAGING — DX DG CHEST 2V
3 series · 3 of 3 positions shown · non-contrast
Comparison: 12/22/2016

CLINICAL DATA: Dizzy, lightheaded

EXAM:
CHEST  2 VIEW

[w chest lat (1 of 2)]
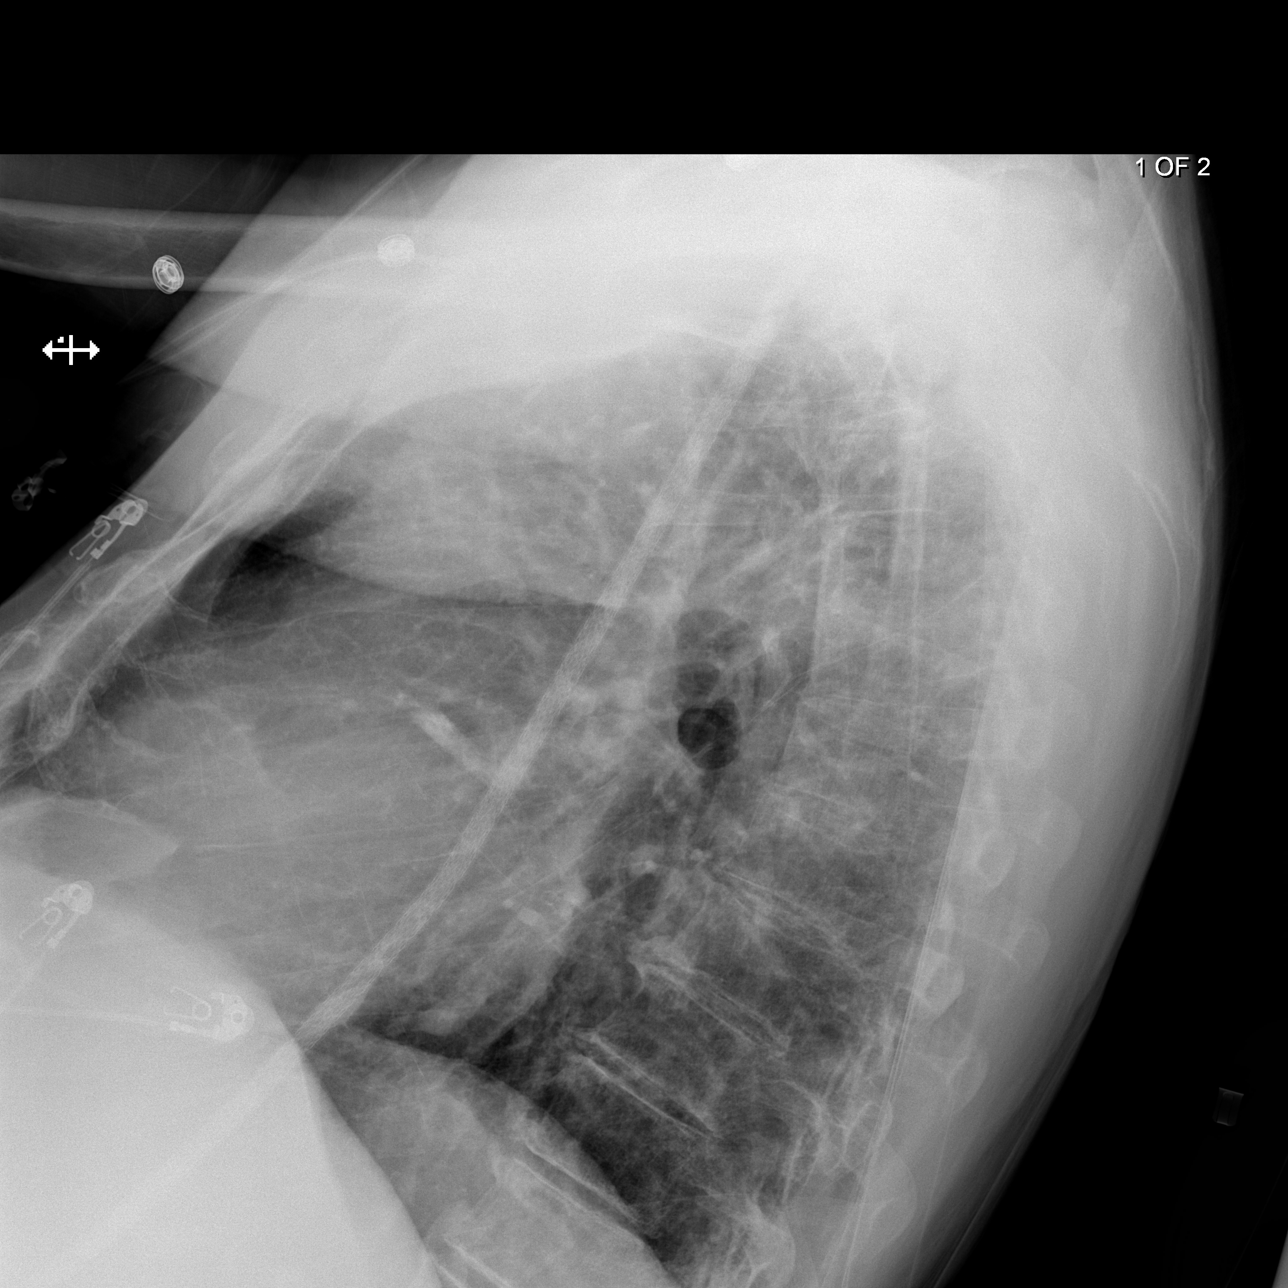

[w chest lat (2 of 2)]
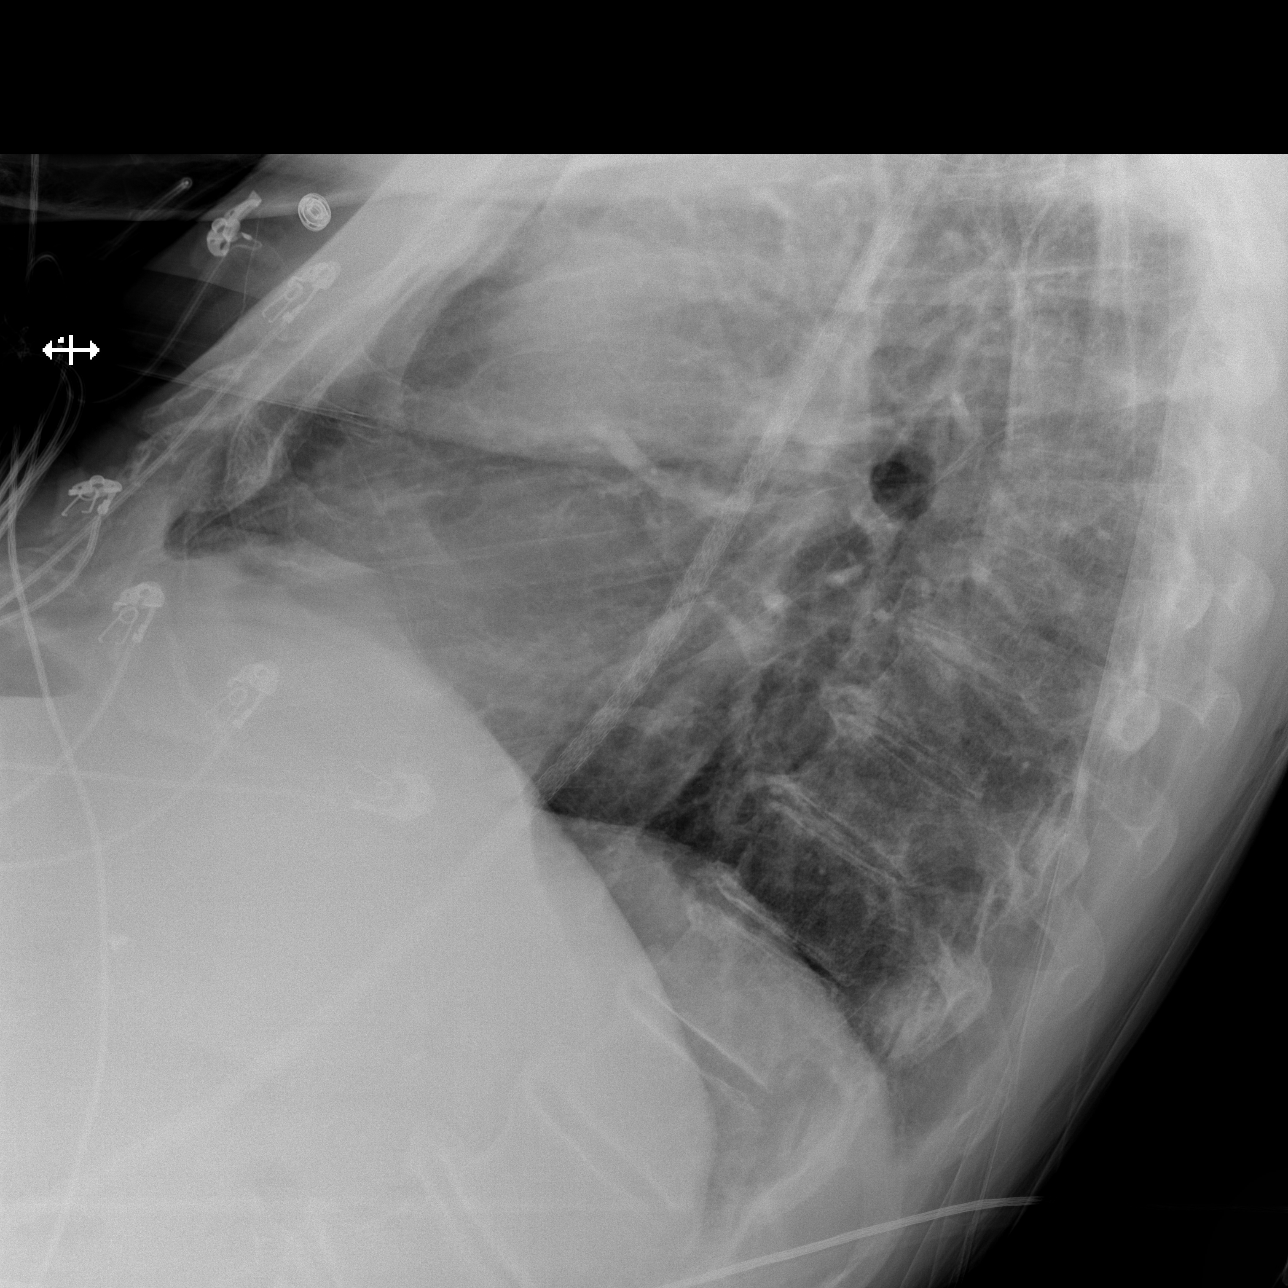

[x chest ap]
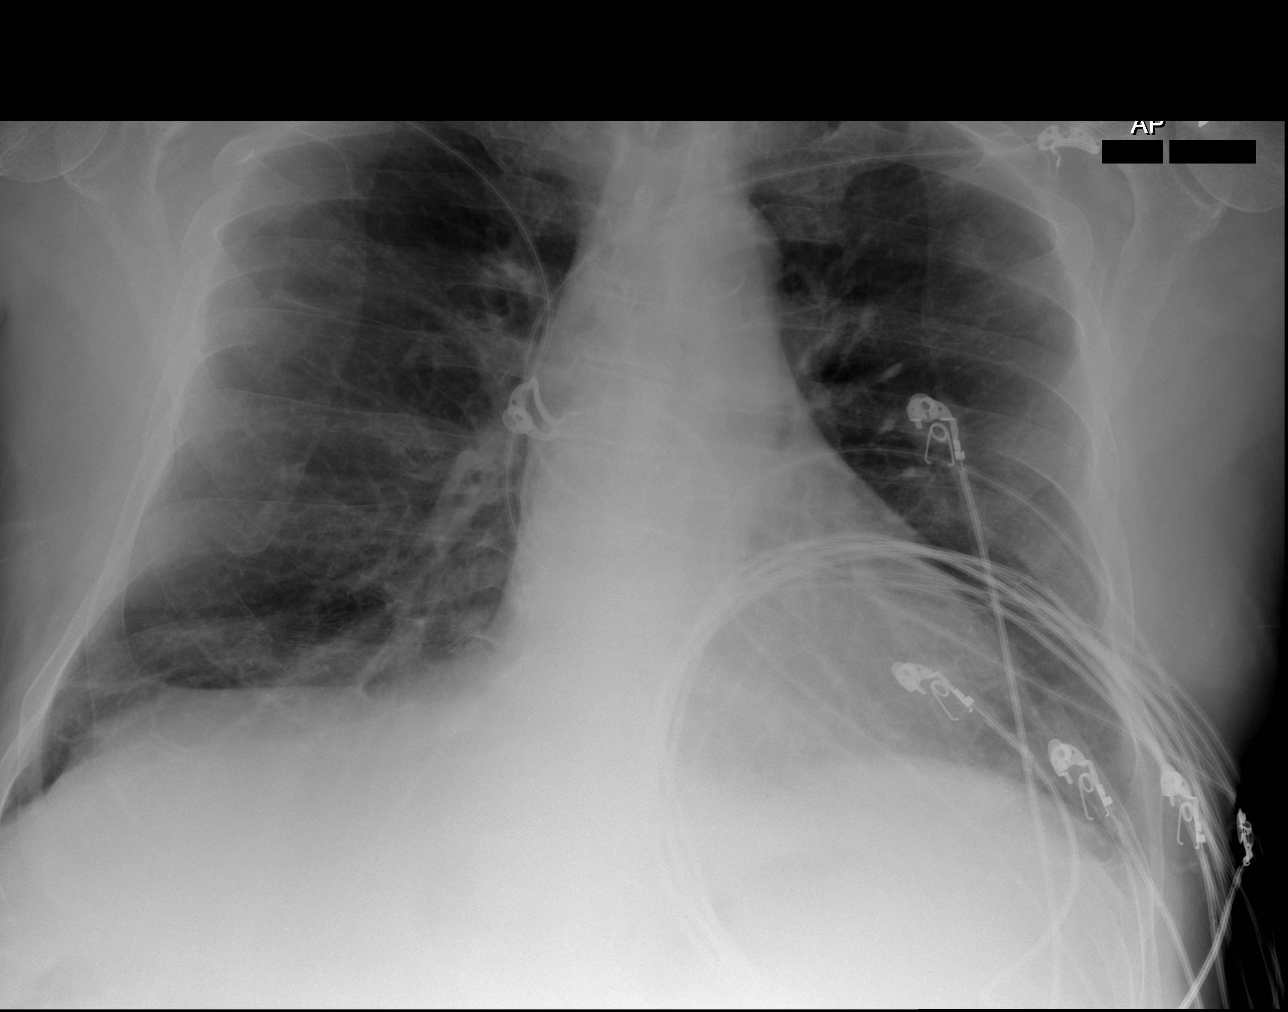

[3 of 3 positions shown; findings below may reference images not displayed]

FINDINGS: Hyperinflation. No focal infiltrate or effusion. Cardiomediastinal
silhouette stable with aortic atherosclerosis. No pneumothorax.
Degenerative changes of the spine.
IMPRESSION: No active cardiopulmonary disease.

## 2019-02-21 ENCOUNTER — Other Ambulatory Visit: Payer: Self-pay | Admitting: Interventional Cardiology

## 2019-02-25 ENCOUNTER — Telehealth: Payer: Self-pay | Admitting: Cardiology

## 2019-02-25 NOTE — Telephone Encounter (Signed)
Spoke with patient who confirmed all demographics. Patient has a smart phone and watch. He uses My Chart.  Will have vitals ready for visit.

## 2019-02-27 IMAGING — US US AORTA
1 series · 14 of 19 positions shown · non-contrast
Comparison: CT 04/29/2018.  Abdominal ultrasound 10/18/2009.

CLINICAL DATA: History of abdominal aortic aneurysm repair in 0447.

EXAM:
ULTRASOUND OF ABDOMINAL AORTA
TECHNIQUE: Ultrasound examination of the abdominal aorta was performed to
evaluate for abdominal aortic aneurysm.

[Series 1: us aorta · 0.33mm/px · 14 of 19 slices shown]
[im 1/19]
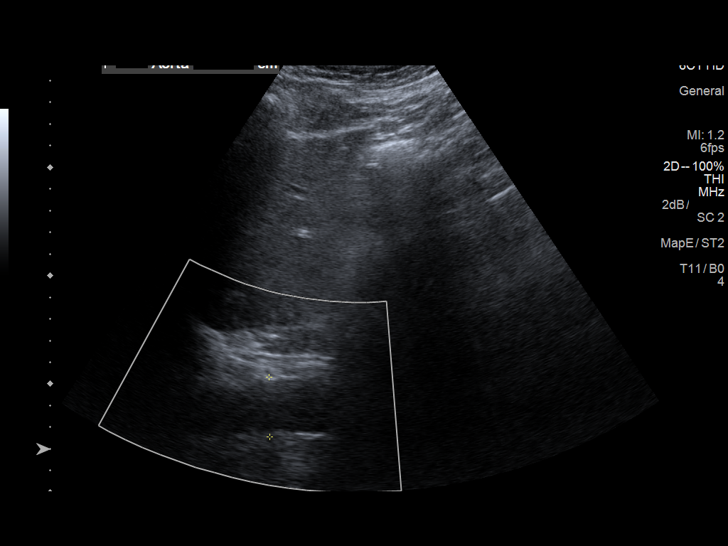
[im 3/19]
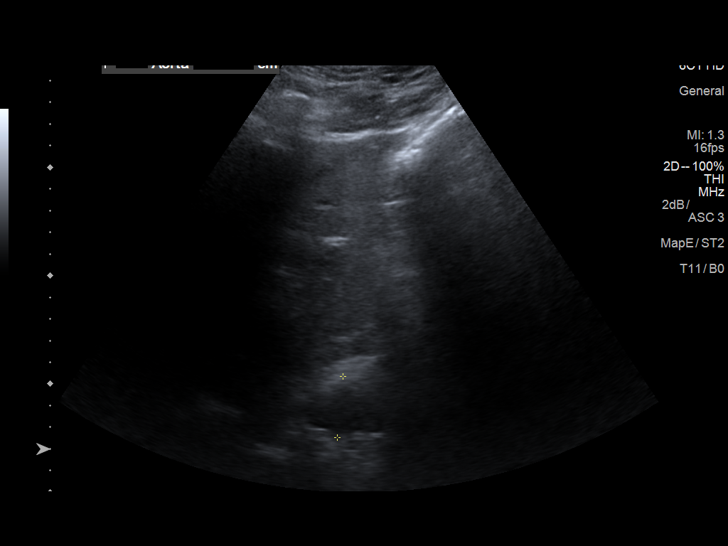
[im 4/19]
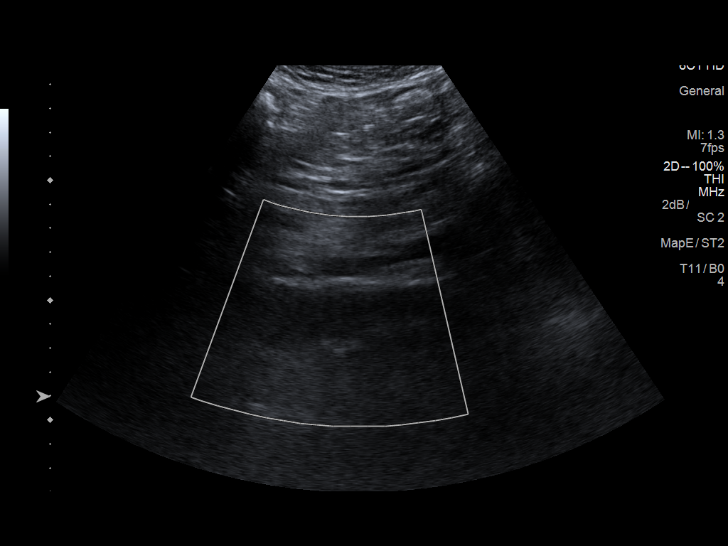
[im 5/19]
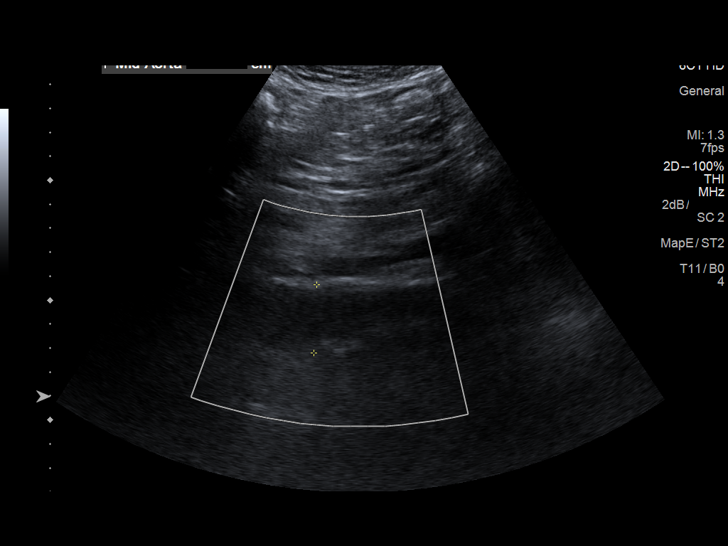
[im 7/19]
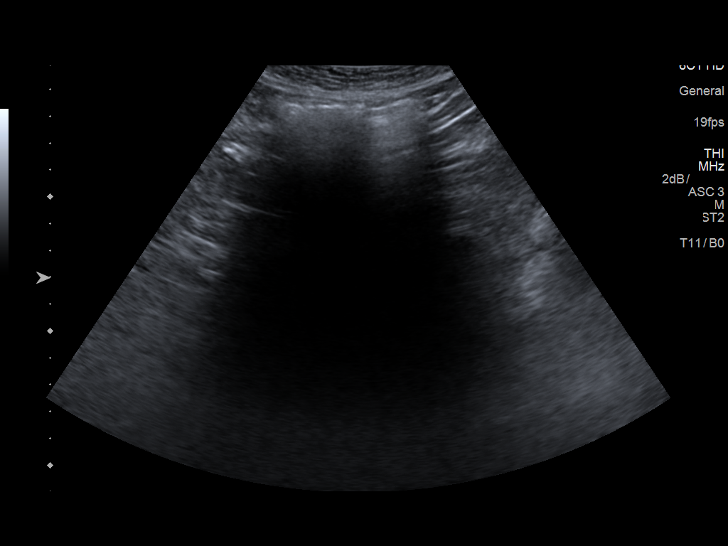
[im 8/19]
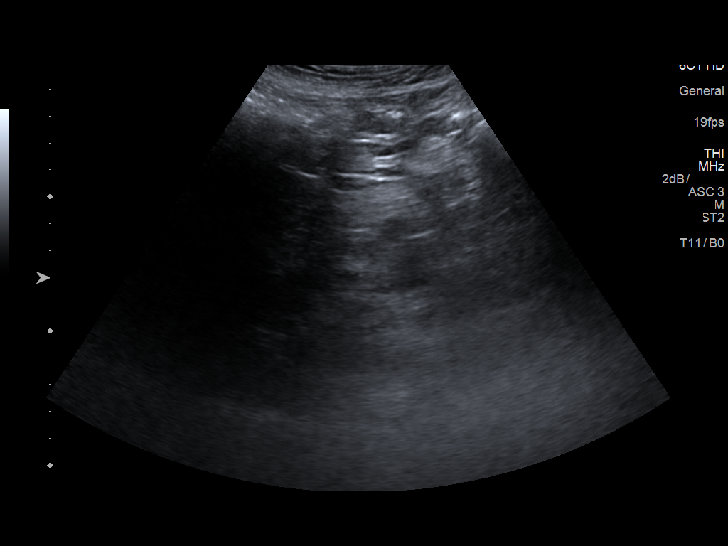
[im 9/19]
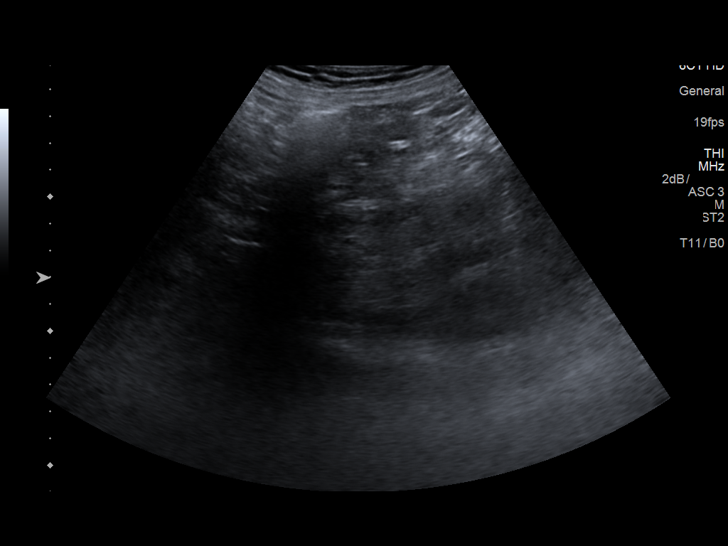
[im 11/19]
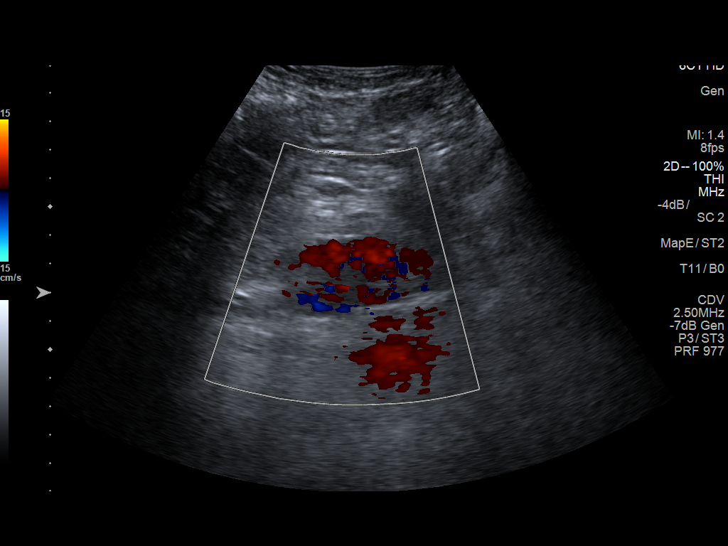
[im 12/19]
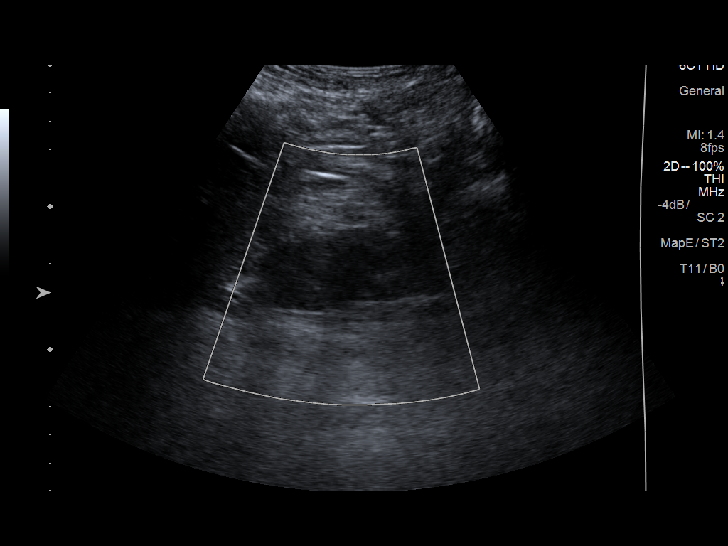
[im 13/19]
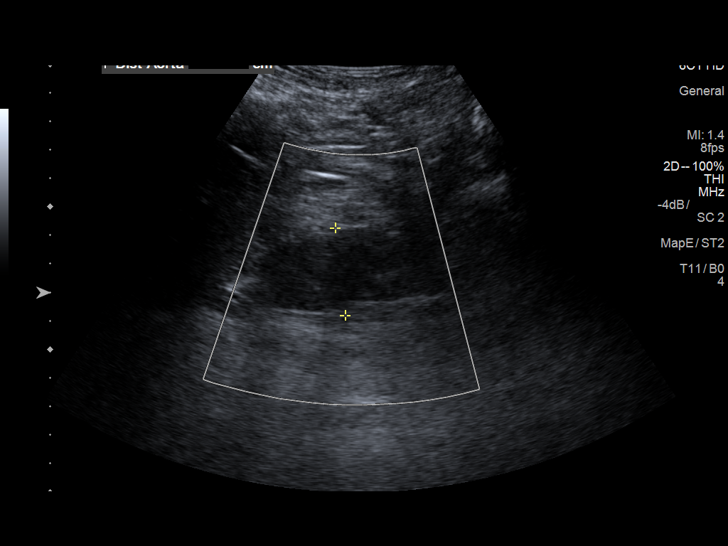
[im 15/19]
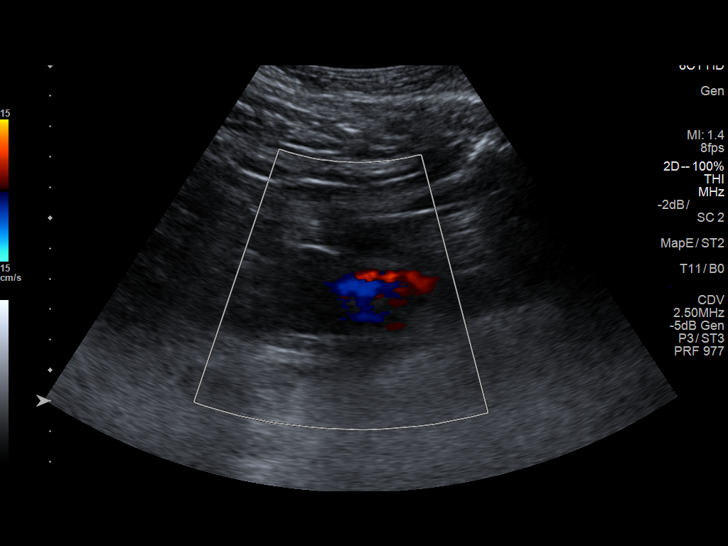
[im 16/19]
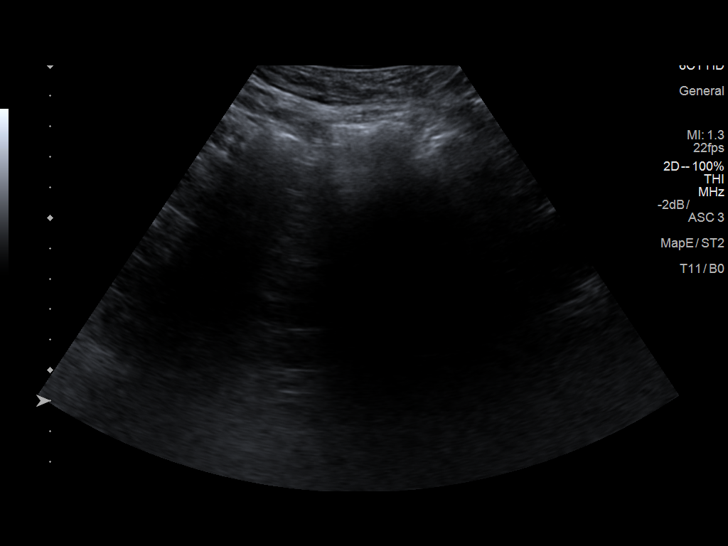
[im 17/19]
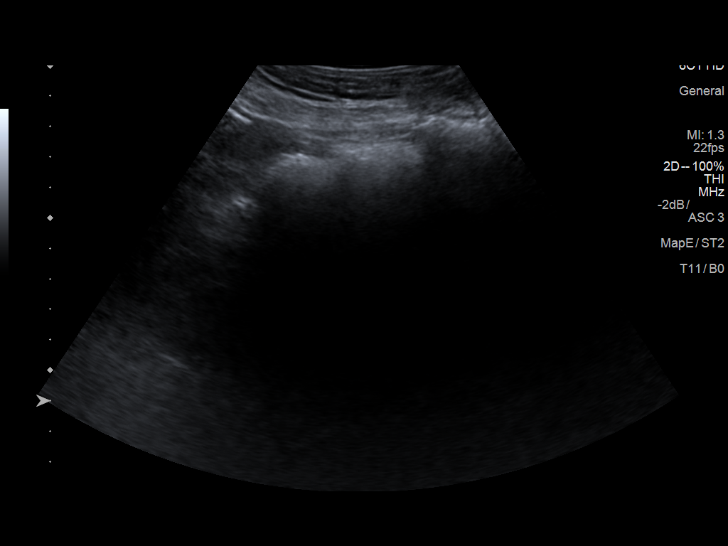
[im 19/19]
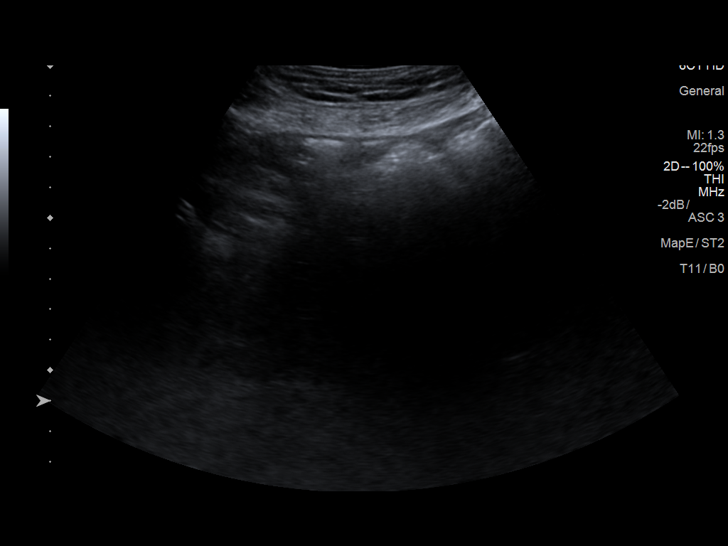

[14 of 19 positions shown; findings below may reference images not displayed]

FINDINGS: Abdominal aortic measurements as follows:

Proximal:  2.8 cm

Mid:  2.9 cm

Distal:  3.1 cm

The iliac arteries are obscured by bowel gas. The remainder of the
examination was also limited by bowel gas, and Doppler evaluation
could not be performed.
IMPRESSION: No recurrent abdominal aortic aneurysm identified status post
abdominal aortic aneurysm repair. Examination is mildly limited by
bowel gas, and the iliac arteries are not visualized.

## 2019-03-08 NOTE — Progress Notes (Signed)
Virtual Visit via Video Note   This visit type was conducted due to national recommendations for restrictions regarding the COVID-19 Pandemic (e.g. social distancing) in an effort to limit this patient's exposure and mitigate transmission in our community.  Due to his co-morbid illnesses, this patient is at least at moderate risk for complications without adequate follow up.  This format is felt to be most appropriate for this patient at this time.  All issues noted in this document were discussed and addressed.  A limited physical exam was performed with this format.  Please refer to the patient's chart for his consent to telehealth for [facility].   Date:  03/09/2019   ID:  [name], DOB 1941-01-13, MRN [id number]  Patient Location: Home Provider Location: Home  PCP:  Kristopher Orn, MD  Cardiologist:  Kristopher Grooms, MD  Electrophysiologist:  None   Evaluation Performed:  Follow-Up Visit  Chief Complaint: Follow-up for CAD, atrial fibrillation, seen for Dr. Tamala Schultz  History of Present Illness:    [name] is a 78 y.o. male with a history of CAD s/p stenting to LCx and LAD, chronic combined systolic and diastolic heart failure (LVEF 40-45% with G1DD), DM 2, paroxysmal atrial fibrillation on chronic anticoagulation, AAA s/p repair and severe polyneuropathy involving bilateral lower extremities.  [name] was hospitalized 04/2018 with near syncope/lightheadedness and treated with IV fluids.  Due to history of AAA repair, this was imaged and was unremarkable. Echocardiogram was unchanged with an LVEF of 40 to 45%. Syncope thought to be in the setting of dehydration. During his post hospital follow-up, patient had complaints of intermittent episodes of feeling "bad" with low BPs and and heart rates in the 50s, speech issues and multiple falls.  He was noted to have been taking CBD oil with escalating doses thought to have been augmenting his BB.  Patient was requesting a CT scan  which was found to be normal.  He was then seen 1 month later by Dr. Tamala Schultz 07/16/2018 without specific complaints.  Vital signs are stable with no recurrent neurological symptoms.  Today I am seeing [name] via telephone visit.  He reports feeling great from a cardiac perspective with no shortness of breath, palpitations, PND, orthopnea, LE swelling, no abnormal weight gain, no chest pain, dizziness or syncope.  He monitors his weight on a daily basis with no acute fluctuations.  He has an apple watch in which he watches his heart rate in which he states it is been stable.  He reports that he slept very poorly last night secondary to his neuropathy which bothers him from time to time.  He is on amitriptyline and Lyrica to help with the symptoms.  As stated above, he had episodes of low BP/heart rate with neurological changes and was sent for a head CT which was found to be unremarkable.  He reports today he has had no recurrent symptoms and has been stable from this aspect.  He states that he receives regular eye exams.  He is to follow with his PCP, Dr. Lavone Schultz in 2 months.  I will send him a message to grab a TSH and liver function panel at that office visit to reduce the amount of in office activity.  Otherwise he is doing well with no specific complaints.   The patient does not have symptoms concerning for COVID-19 infection (fever, chills, cough, or new shortness of breath).   Past Medical History:  Diagnosis Date  AAA (abdominal aortic aneurysm) ([place])    a. s/p repair 2009.   Acid reflux    takes Prilosec and Protonix daily   Arthritis    BacK   CAD in native artery    a. a. prior inf MI 1993. b. PCI to LAD 05/1997. c. recurrent inferolateral MI complicated by V. fib arrest in 04/1998, prior PCI to OM1. d. 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   Chronic combined systolic and diastolic CHF (congestive heart failure) (HCC)    Complication of anesthesia    -years ago hair fell out.;ileus  after 2 of his surgeries   Dry skin    Foot drop, bilateral    Hypercholesteremia    Hypertension    Ileus ([place])    After AAA   Incisional hernia    "small, from AAA"   Ischemic cardiomyopathy    MI (myocardial infarction) ([place])    Neuropathy    in feet & legs   OSA on CPAP    uses CPAP--sleep study done at least 50yrs ago   PAF (paroxysmal atrial fibrillation) (HCC)    Pain    back pain chronic- seen at pain clinic   Plantar fasciitis    bilatetral   Thrombocytopenia ([place])    Type II diabetes mellitus ([place])    Past Surgical History:  Procedure Laterality Date   ABDOMINAL AORTIC ANEURYSM REPAIR     BACK SURGERY  2012-2013 X 3   Miminal Invasive x 3in [place].   CARDIAC CATHETERIZATION  2009/2012   CARDIOVERSION N/A 04/12/2015   Procedure: CARDIOVERSION;  Surgeon: Sueanne Margarita, MD;  Location: [facility];  Service: Cardiovascular;  Laterality: N/A;   CARDIOVERSION N/A 12/26/2016   Procedure: CARDIOVERSION;  Surgeon: Sueanne Margarita, MD;  Location: [facility];  Service: Cardiovascular;  Laterality: N/A;   COLONOSCOPY     CORONARY ANGIOPLASTY WITH STENT PLACEMENT  1992; [place] LAMINECTOMY/DECOMPRESSION MICRODISCECTOMY Right 01/16/2016   Procedure: Right Lumbar five-Sacral one Laminectomy;  Surgeon: Kristeen Miss, MD;  Location: [facility];  Service: Neurosurgery;  Laterality: Right;  Right L5-S1 Laminectomy     Current Meds  Medication Sig   amiodarone (PACERONE) 200 MG tablet Take 1 tablet by mouth once daily   amitriptyline (ELAVIL) 25 MG tablet Take 3-4 tablets by mouth at bedtime.   atorvastatin (LIPITOR) 10 MG tablet Take 10 mg by mouth daily.   Coenzyme Q10 (CO Q 10) 100 MG CAPS Take 100 mg by mouth daily.   diazepam (VALIUM) 5 MG tablet Take 5 mg by mouth at bedtime as needed (leg cramps/sleep).    empagliflozin (JARDIANCE) 10 MG TABS tablet Take 10 mg by mouth daily with breakfast.     finasteride (PROPECIA) 1 MG tablet Take 1 tablet by mouth daily.   metFORMIN (GLUCOPHAGE-XR) 500 MG 24 hr tablet Take 500-1,000 mg by mouth See admin instructions. 1,000 mg in the morning and 500 mg in the evening   metoprolol tartrate (LOPRESSOR) 50 MG tablet Take 1 tablet by mouth twice daily   Multiple Vitamins-Minerals (ONE-A-DAY MENS 50+ ADVANTAGE) TABS Take 1 tablet by mouth daily.   omeprazole (PRILOSEC) 20 MG capsule Take 20 mg by mouth daily.   oxyCODONE-acetaminophen (PERCOCET/ROXICET) 5-325 MG tablet Take 1 tablet by mouth every 6 (six) hours as needed (pain).    pregabalin (LYRICA) 300 MG capsule Take 1 capsule by mouth 2 (two) times a day.   ramipril (ALTACE) 5 MG capsule Take  1 capsule (5 mg total) by mouth daily.   rivaroxaban (XARELTO) 20 MG TABS tablet Take 20 mg by mouth daily with supper.   vitamin B-12 (CYANOCOBALAMIN) 1000 MCG tablet Take 1,000 mcg by mouth daily.      Allergies:   Cymbalta [duloxetine hcl]; Neurontin [gabapentin]; and Keppra [levetiracetam]   Social History   Tobacco Use   Smoking status: Former Smoker    Packs/day: 1.00    Years: 33.00    Pack years: 33.00    Types: Cigarettes    Last attempt to quit: 01/01/1992    Years since quitting: 27.2   Smokeless tobacco: Never Used  Substance Use Topics   Alcohol use: Yes    Comment: 6/30;2017 "GLASS OF WINE Q COUPLE MONTHS, IF THAT"   Drug use: No    Family Hx: The patient's family history includes Arthritis in his father; Coronary artery disease in his father and mother; Diabetes in his father and mother; Rheum arthritis in his father.  ROS:   Please see the history of present illness.     All other systems reviewed and are negative.  Prior CV studies:   The following studies were reviewed today:  Echocardiogram 05/09/2018: Study Conclusions  - Left ventricle: The cavity size was mildly dilated. Wall   thickness was increased in a pattern of mild LVH. Systolic   function was  mildly to moderately reduced. The estimated ejection   fraction was in the range of 40% to 45%. There is hypokinesis of   the inferolateral and inferior myocardium. Doppler parameters are   consistent with abnormal left ventricular relaxation (grade 1   diastolic dysfunction). - Aortic valve: Mildly calcified annulus. Trileaflet. - Mitral valve: There was trivial regurgitation. - Left atrium: The atrium was mildly to moderately dilated. - Right atrium: Central venous pressure (est): 3 mm Hg. - Atrial septum: No defect or patent foramen ovale was identified. - Tricuspid valve: There was trivial regurgitation. - Pulmonary arteries: Systolic pressure could not be accurately   estimated. - Pericardium, extracardiac: There was no pericardial effusion.  Echocardiogram 06/12/2017: Study Conclusions  - Left ventricle: The cavity size was mildly dilated. There was   mild concentric hypertrophy. Systolic function was mildly to   moderately reduced. The estimated ejection fraction was in the   range of 40% to 45%. Mild diffuse hypokinesis with regional   variations. Doppler parameters are consistent with abnormal left   ventricular relaxation (grade 1 diastolic dysfunction). - Aortic valve: There was trivial regurgitation. - Left atrium: The atrium was mildly dilated.  Aortic ultrasound 05/08/2018: IMPRESSION: No recurrent abdominal aortic aneurysm identified status post abdominal aortic aneurysm repair. Examination is mildly limited by bowel gas, and the iliac arteries are not visualized.  Labs/Other Tests and Data Reviewed:    EKG:  An ECG dated 05/08/2018 was personally reviewed today and demonstrated:  NSR with PVCs  Recent Labs: 05/08/2018: B Natriuretic Peptide 135.7; Hemoglobin 15.3; Platelets 148 06/02/2018: ALT 36; BUN 24; Creatinine, Ser 1.12; Potassium 4.4; Sodium 140; TSH 0.488   Recent Lipid Panel Lab Results  Component Value Date/Time   CHOL  11/26/2010 07:50 AM    147         ATP III CLASSIFICATION:  <200     mg/dL   Desirable  200-239  mg/dL   Borderline High  >=240    mg/dL   High          TRIG 132 11/26/2010 07:50 AM   HDL 29 (L) 11/26/2010  07:50 AM   CHOLHDL 5.1 11/26/2010 07:50 AM   LDLCALC  11/26/2010 07:50 AM    92        Total Cholesterol/HDL:CHD Risk Coronary Heart Disease Risk Table                     Men   Women  1/2 Average Risk   3.4   3.3  Average Risk       5.0   4.4  2 X Average Risk   9.6   7.1  3 X Average Risk  23.4   11.0        Use the calculated Patient Ratio above and the CHD Risk Table to determine the patient's CHD Risk.        ATP III CLASSIFICATION (LDL):  <100     mg/dL   Optimal  100-129  mg/dL   Near or Above                    Optimal  130-159  mg/dL   Borderline  160-189  mg/dL   High  >190     mg/dL   Very High    Wt Readings from Last 3 Encounters:  07/16/18 213 lb 1.9 oz (96.7 kg)  06/02/18 204 lb 6.4 oz (92.7 kg)  05/09/18 199 lb 12.8 oz (90.6 kg)    Objective:    Vital Signs:  BP (!) 122/58 (BP Location: Left Arm, Patient Position: Sitting, Cuff Size: Normal)    Pulse 66    Ht 6\' 1"  (1.854 m)    BMI 28.12 kg/m    VITAL SIGNS:  reviewed GEN:  no acute distress RESPIRATORY:  normal respiratory effort, symmetric expansion NEURO:  alert and oriented x 3, no obvious focal deficit PSYCH:  normal affect  ASSESSMENT & PLAN:    1.  Chronic combined systolic and diastolic heart failure: -Stable, no complaints of abnormal weight gain, shortness of breath or lower extremity edema -Continue ramipril 5, Lopressor 50, -Creatinine, 1.12 on 05/2018  2.  Paroxysmal atrial fibrillation: -No reports of palpitations, irregularities on Apple Watch -Last EKG 04/2018 NSR with PVCs -Maintained on amiodarone 200 mg daily, Xarelto 20 mg daily -No reports of acute bleeding in urine or stool -Last CXR with no active disease, pulmonary scarring noted -Reports regular eye exams  -Last AST/ALT, 25/36; TSH, 0.4  05/2018 -We will contact Dr. Laurann Montana, PCP to obtain TSH, liver function panel, and lipid panel at wellness check 04/2021 reduce the need for office activity  3.  Hyperlipidemia: -Stable, last LDL 31 -Continue statin  4.  Essential hypertension: -Stable, 122/58 -Continue Lopressor 50, ramipril 5  5.  Abdominal aortic aneurysm s/p repair: -Last aortic US 04/2018 with no recurrent abdominal aortic aneurysm identified status post abdominal aortic aneurysm repair.  6.  CAD s/p PCI of LCx/LAD: -Denies anginal symptoms -Continue atorvastatin, Lopressor, no ASA secondary to Xarelto   COVID-19 Education: The signs and symptoms of COVID-19 were discussed with the patient and how to seek care for testing (follow up with PCP or arrange E-visit). The importance of social distancing was discussed today.  Time:   Today, I have spent 20 minutes with the patient with telehealth technology discussing the above problems.     Medication Adjustments/Labs and Tests Ordered: Current medicines are reviewed at length with the patient today.  Concerns regarding medicines are outlined above.   Tests Ordered: No orders of the defined types were placed in this encounter.  Medication Changes: No orders of the defined types were placed in this encounter.   Disposition:  Follow up Dr. Tamala Schultz in 6 months or sooner if needed  Signed, Kathyrn Drown, NP  03/09/2019 3:37 PM    [place]

## 2019-03-09 ENCOUNTER — Encounter: Payer: Self-pay | Admitting: Cardiology

## 2019-03-09 ENCOUNTER — Other Ambulatory Visit: Payer: Self-pay

## 2019-03-09 ENCOUNTER — Telehealth (INDEPENDENT_AMBULATORY_CARE_PROVIDER_SITE_OTHER): Payer: Medicare Other | Admitting: Cardiology

## 2019-03-09 VITALS — BP 122/58 | HR 66 | Ht 73.0 in

## 2019-03-09 DIAGNOSIS — I714 Abdominal aortic aneurysm, without rupture, unspecified: Secondary | ICD-10-CM

## 2019-03-09 DIAGNOSIS — I480 Paroxysmal atrial fibrillation: Secondary | ICD-10-CM

## 2019-03-09 DIAGNOSIS — I2510 Atherosclerotic heart disease of native coronary artery without angina pectoris: Secondary | ICD-10-CM

## 2019-03-09 DIAGNOSIS — I5042 Chronic combined systolic (congestive) and diastolic (congestive) heart failure: Secondary | ICD-10-CM

## 2019-03-09 DIAGNOSIS — Z79899 Other long term (current) drug therapy: Secondary | ICD-10-CM

## 2019-03-09 DIAGNOSIS — Z7901 Long term (current) use of anticoagulants: Secondary | ICD-10-CM

## 2019-03-09 NOTE — Patient Instructions (Addendum)
Medication Instructions:  Your physician recommends that you continue on your current medications as directed. Please refer to the Current Medication list given to you today.  If you need a refill on your cardiac medications before your next appointment, please call your pharmacy.   Lab work:  If you have labs (blood work) drawn today and your tests are completely normal, you will receive your results only by: [name] MyChart Message (if you have MyChart) OR . A paper copy in the mail If you have any lab test that is abnormal or we need to change your treatment, we will call you to review the results.  Testing/Procedures: Please have your primary care doctor draw a TSH & CMET at your next visit   Follow-Up: At [facility], you and your health needs are our priority.  As part of our continuing mission to provide you with exceptional heart care, we have created designated Provider Care Teams.  These Care Teams include your primary Cardiologist (physician) and Advanced Practice Providers (APPs -  Physician Assistants and Nurse Practitioners) who all work together to provide you with the care you need, when you need it. You will need a follow up appointment in 6 months.  Please call our office 2 months in advance to schedule this appointment.  You may see Sinclair Grooms, MD or one of the following Advanced Practice Providers on your designated Care Team:   Truitt Merle, NP Cecilie Kicks, NP . Kathyrn Drown, NP  Any Other Special Instructions Will Be Listed Below (If Applicable).

## 2019-04-29 ENCOUNTER — Other Ambulatory Visit: Payer: Self-pay | Admitting: Interventional Cardiology

## 2019-05-10 ENCOUNTER — Other Ambulatory Visit: Payer: Self-pay | Admitting: Interventional Cardiology

## 2019-05-26 IMAGING — DX DG ANKLE COMPLETE 3+V*L*
3 series · 3 of 3 positions shown · non-contrast
Comparison: None.

CLINICAL DATA: Fall with left ankle injury and pain. Initial
encounter.

EXAM:
LEFT ANKLE COMPLETE - 3+ VIEW

[ankle ap]
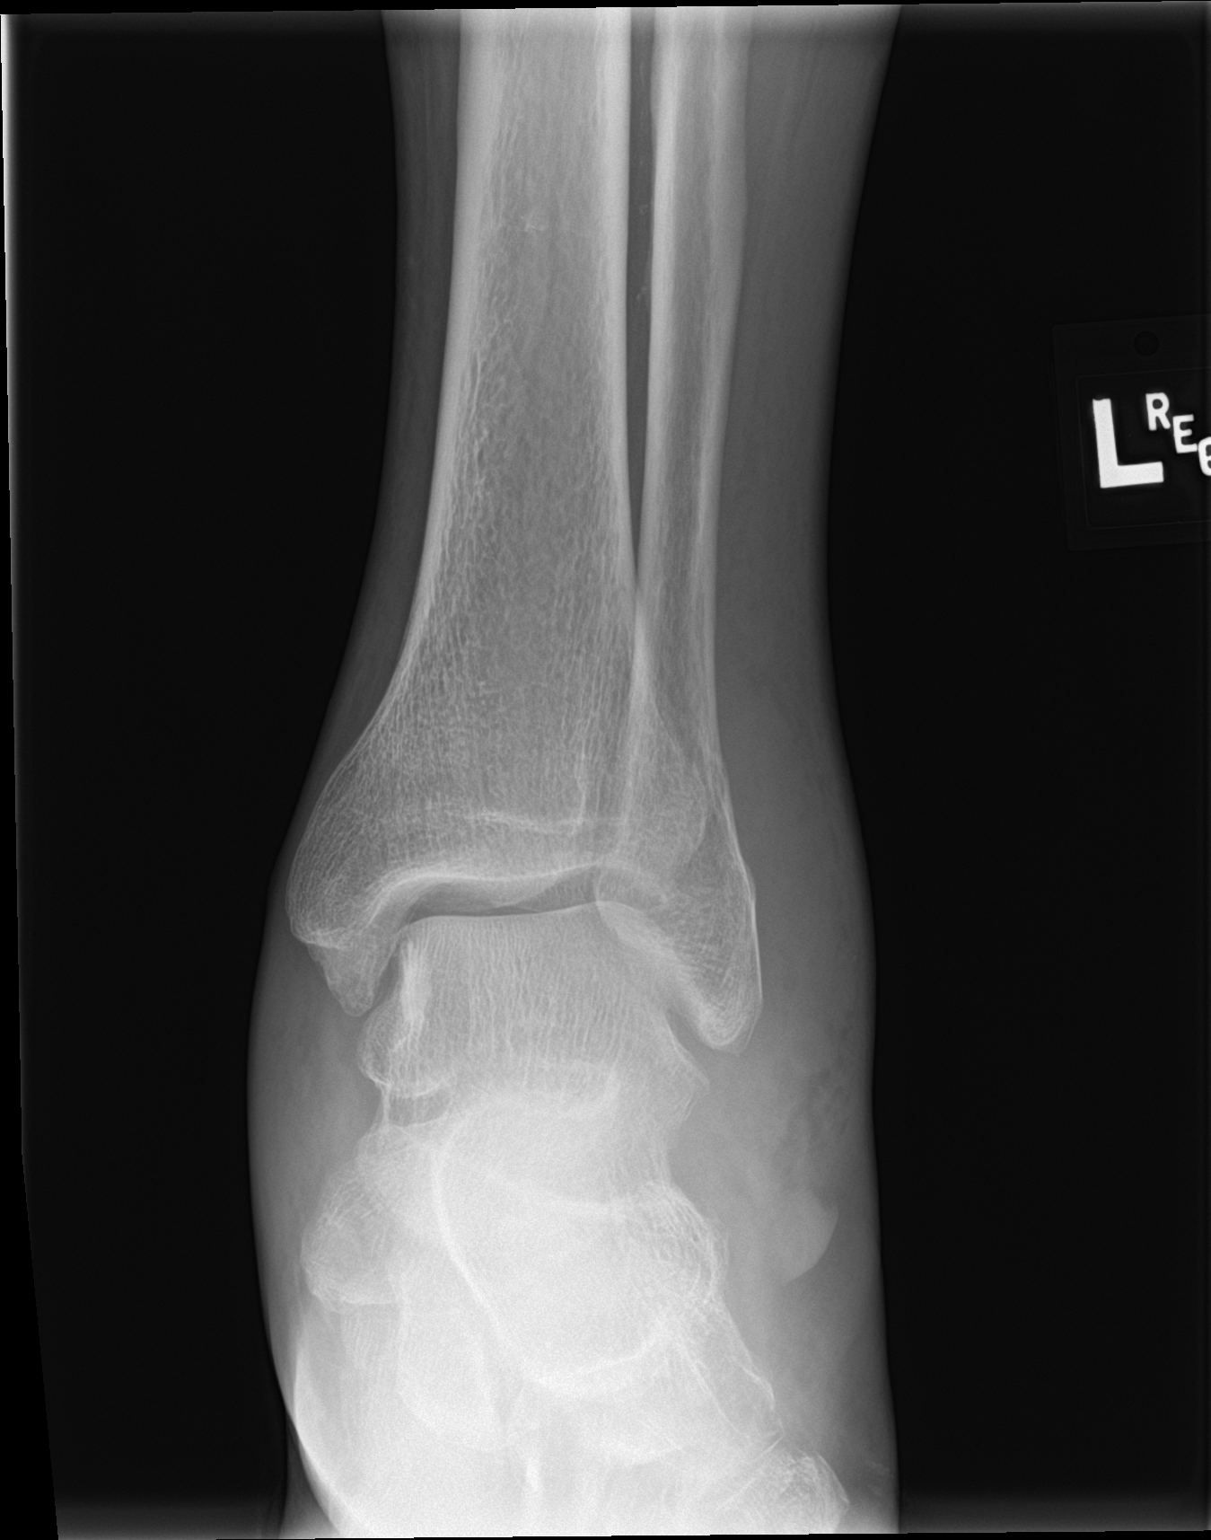

[ankle lat]
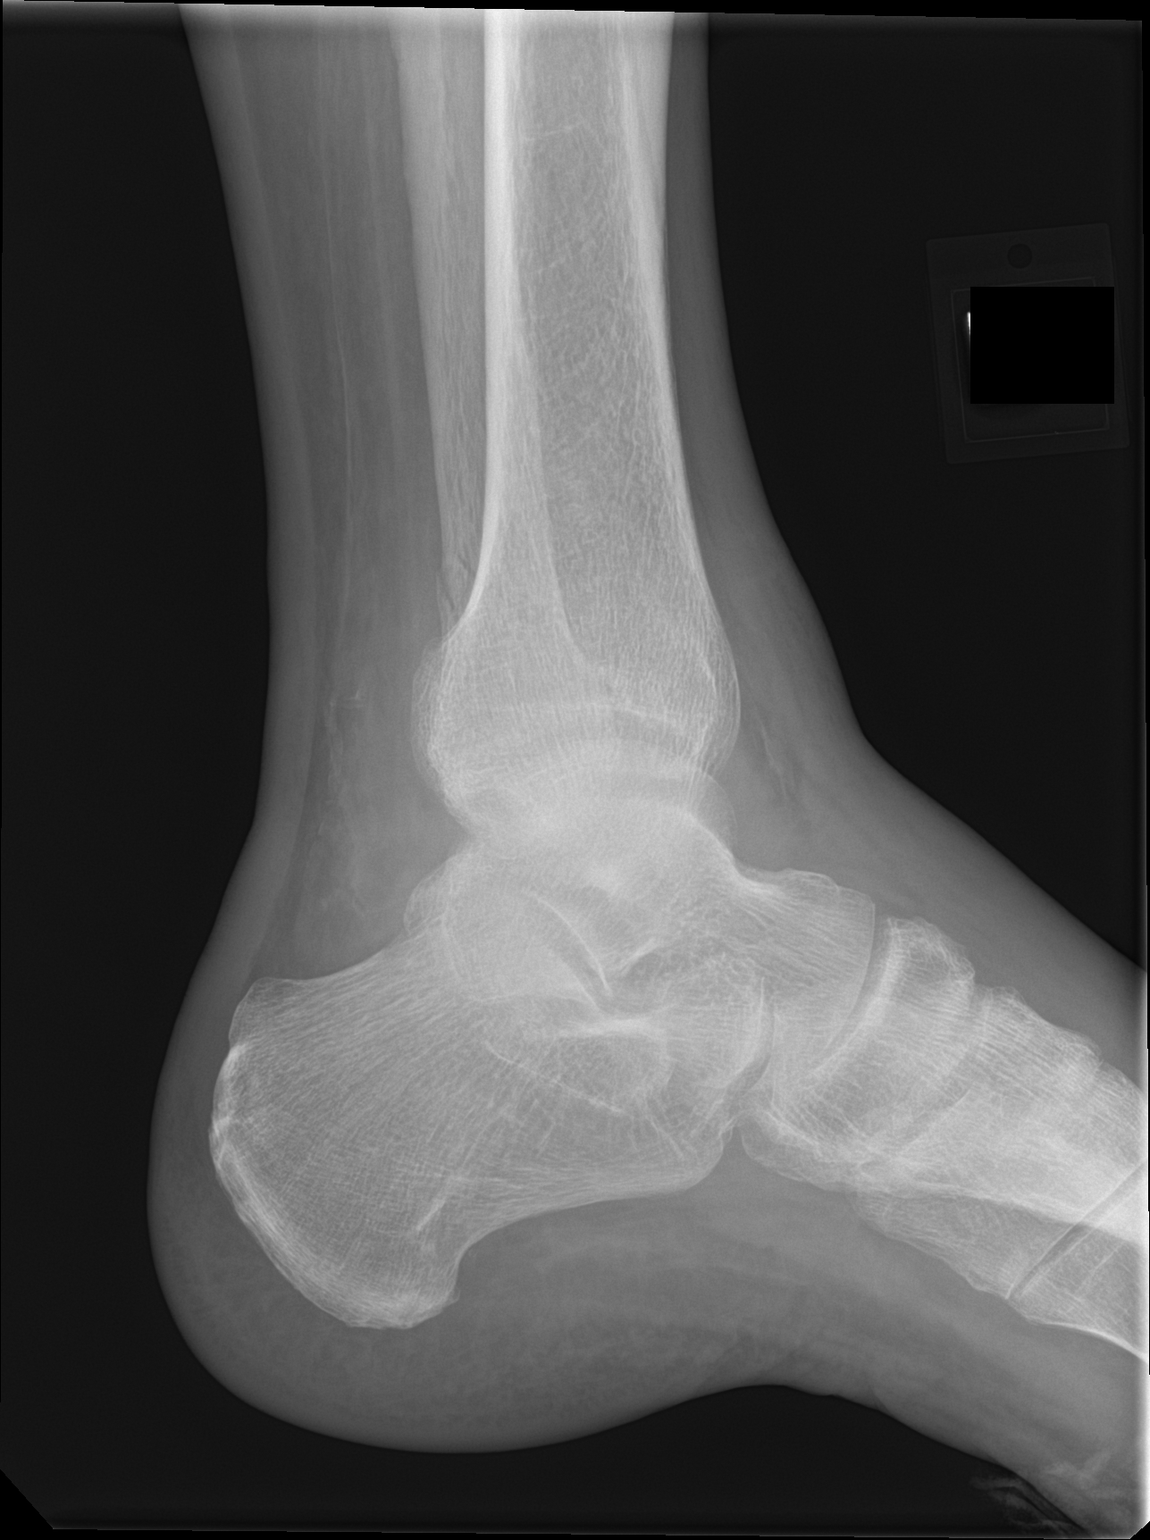

[ankle obl]
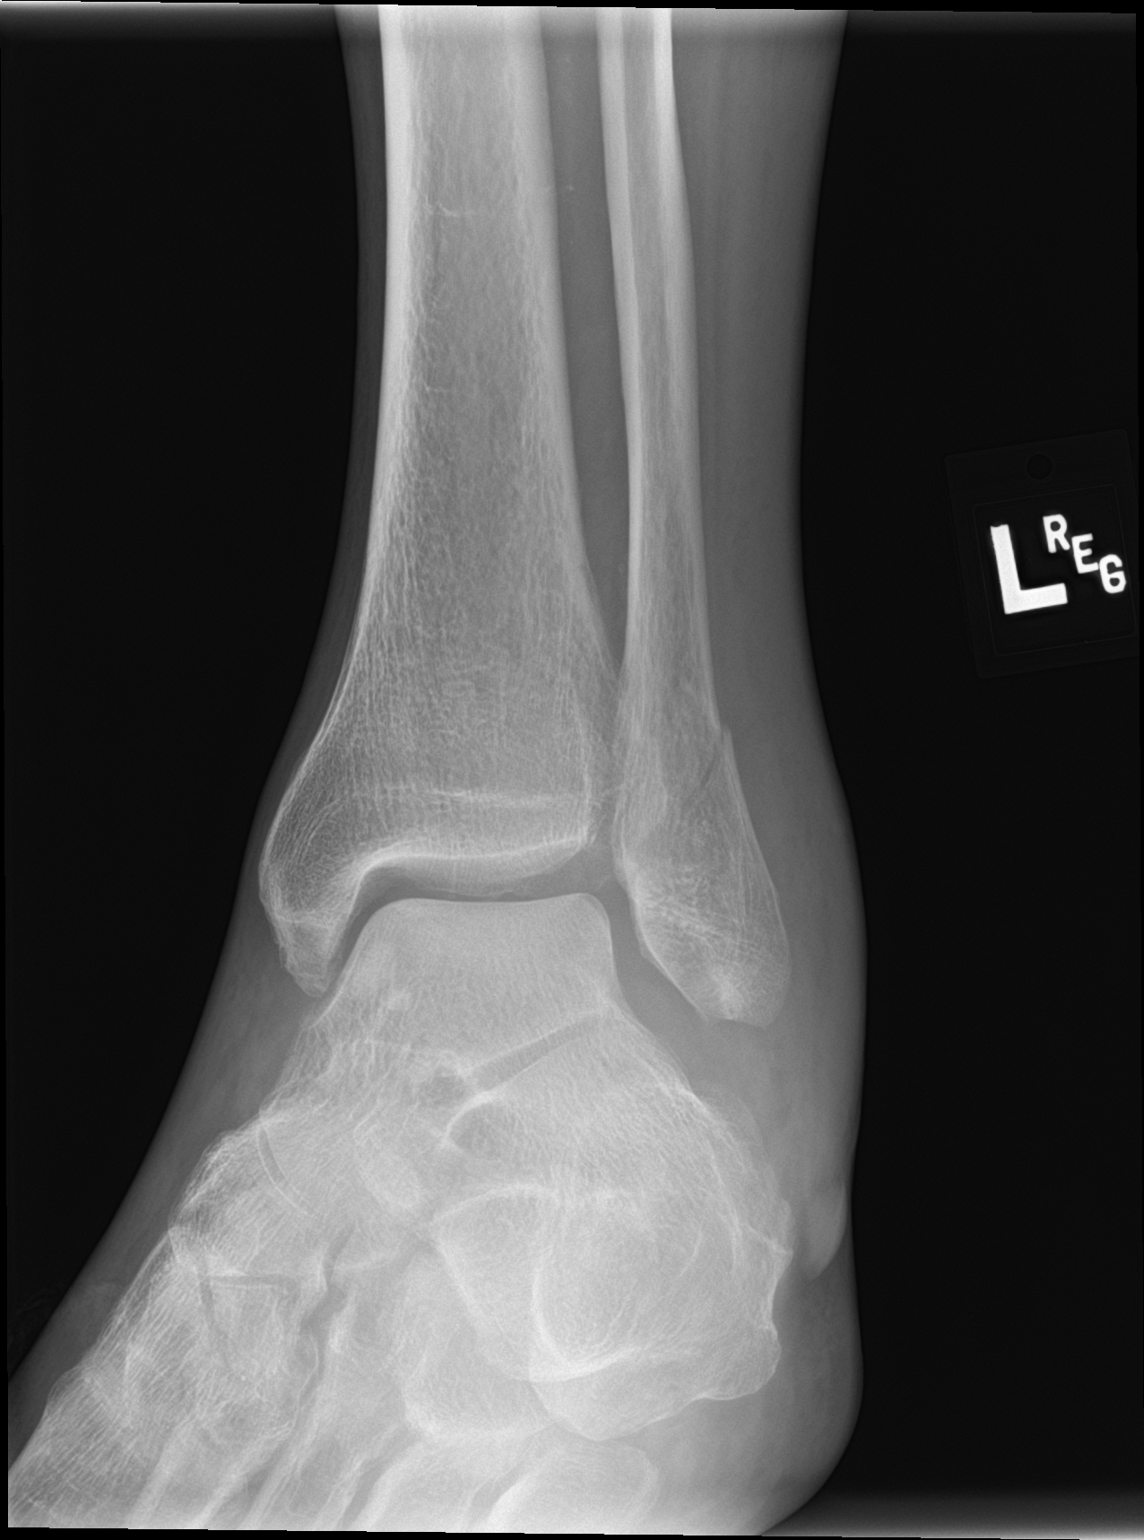

[3 of 3 positions shown; findings below may reference images not displayed]

FINDINGS: There is oblique nondisplaced fracture through the distal fibula.
Significant overlying soft tissue swelling is present. The ankle
mortise shows slight asymmetry and widening laterally. No other
fractures identified.
IMPRESSION: Nondisplaced fracture of the distal fibula with significant
overlying soft tissue swelling.

## 2019-05-26 IMAGING — DX DG KNEE COMPLETE 4+V*L*
4 series · 4 of 4 positions shown · non-contrast
Comparison: None.

CLINICAL DATA: Fall today with left knee injury. Initial encounter.

EXAM:
LEFT KNEE - COMPLETE 4+ VIEW

[knee ap]
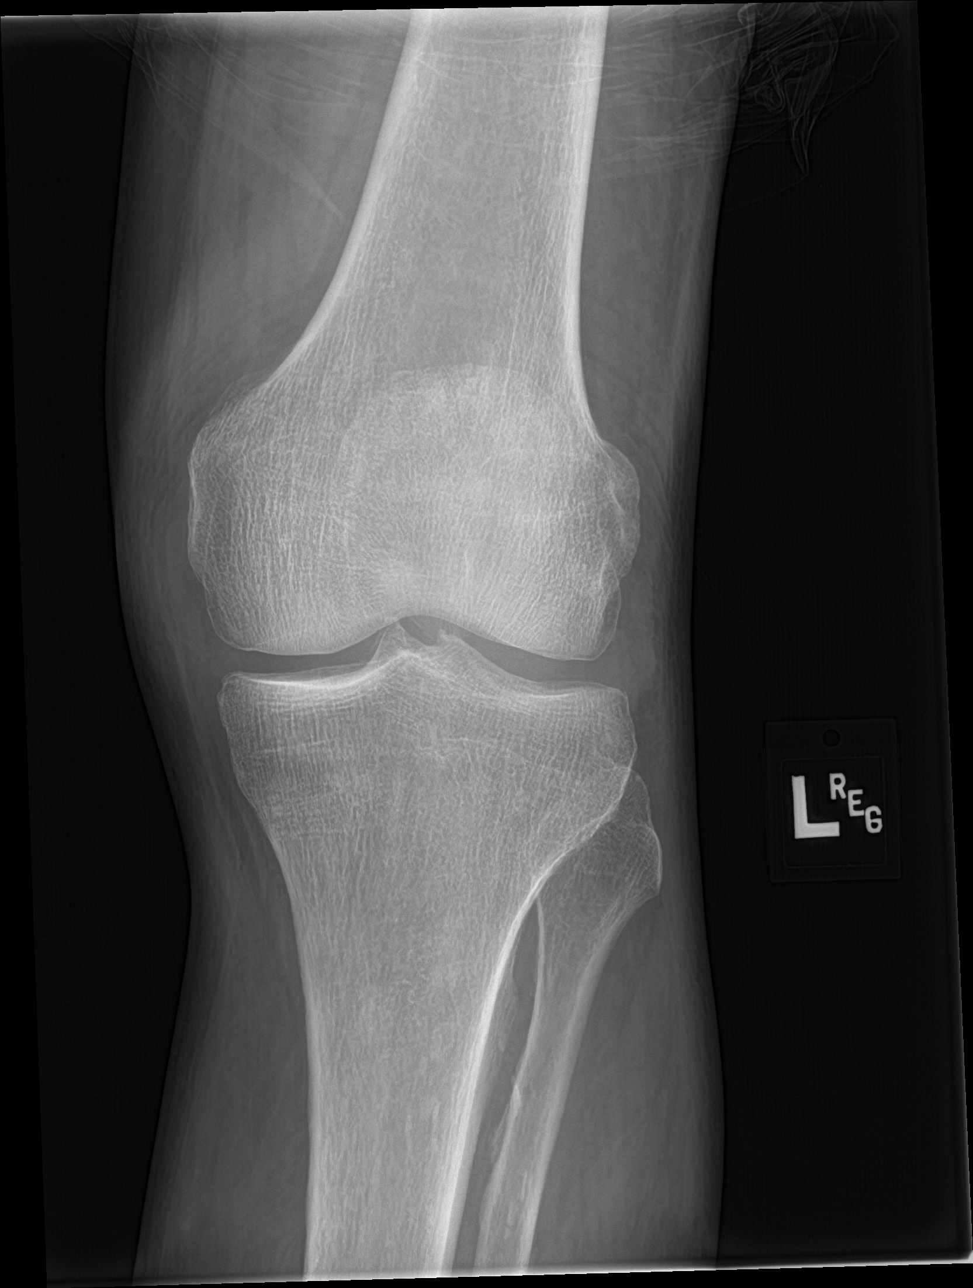

[knee lat]
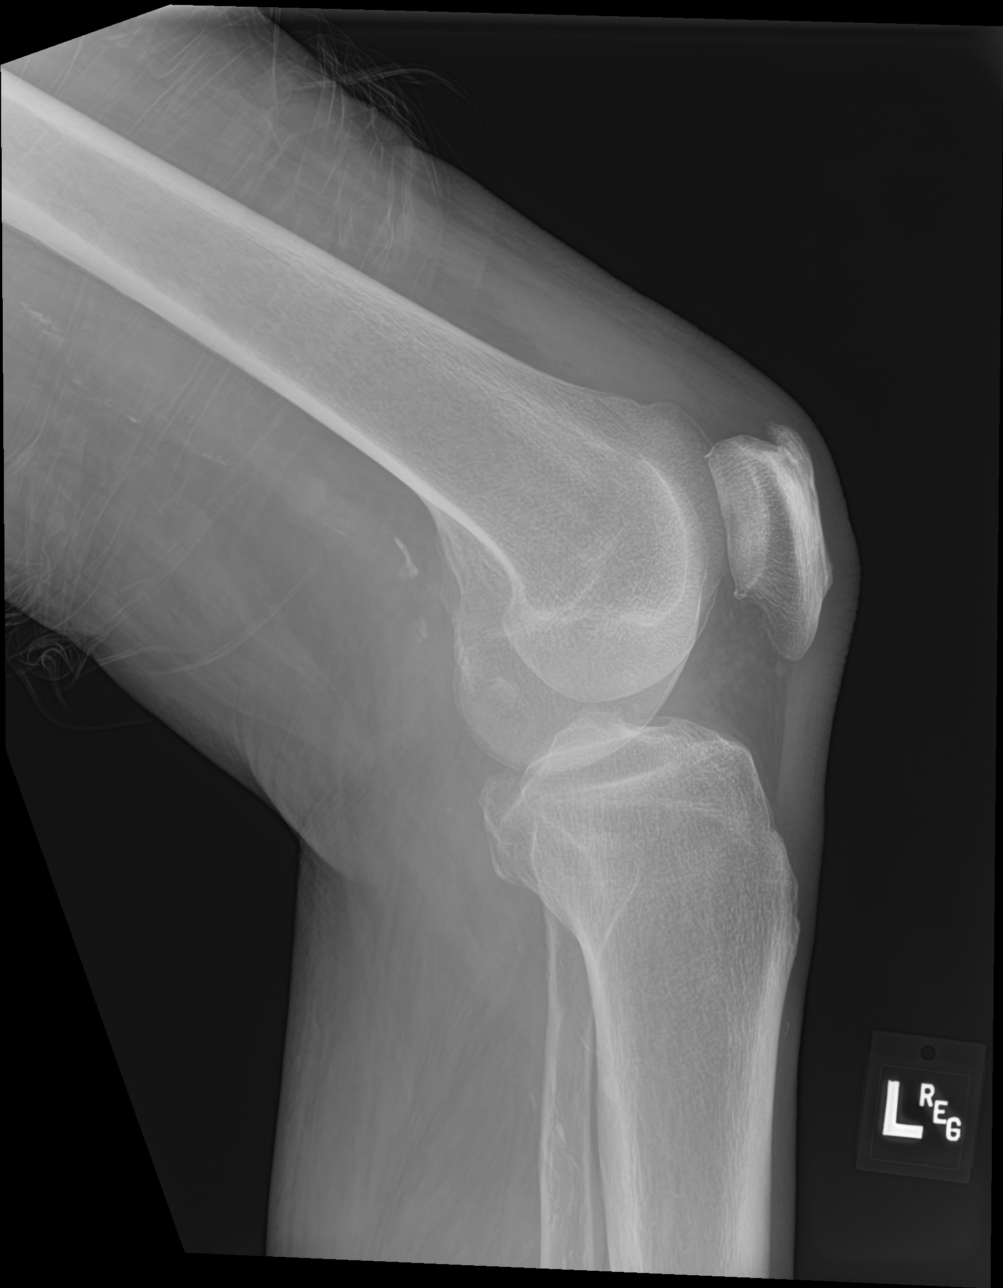

[knee obl (1 of 2)]
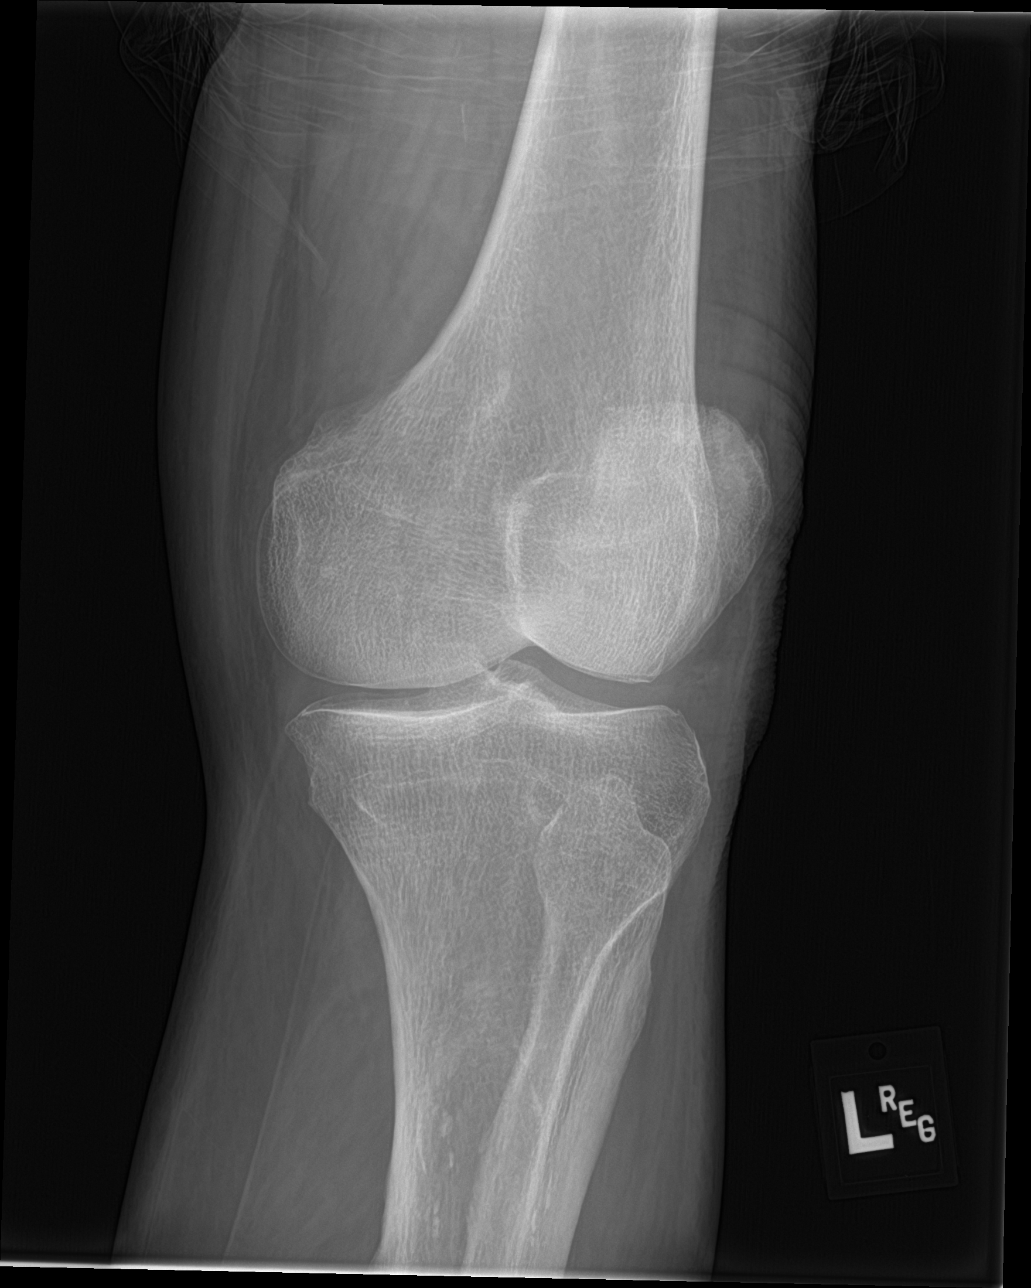

[knee obl (2 of 2)]
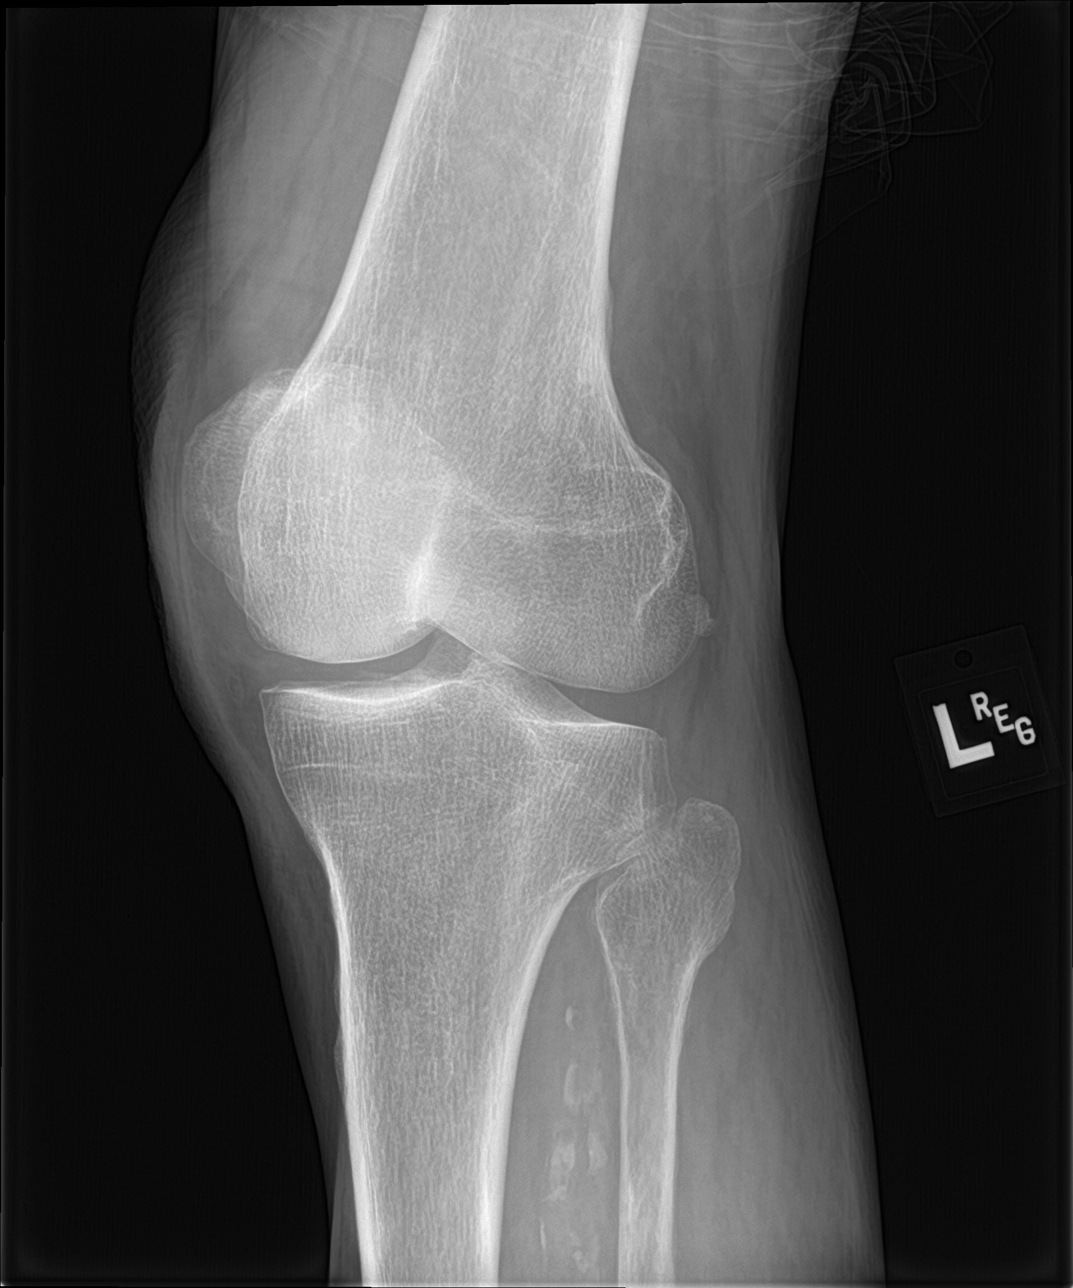

[4 of 4 positions shown; findings below may reference images not displayed]

FINDINGS: No evidence of fracture, dislocation, or joint effusion. Mild
patellar osteophyte formation. Soft tissues are unremarkable. There
are some vascular calcifications in the popliteal artery.
IMPRESSION: No acute findings.

## 2019-06-02 ENCOUNTER — Other Ambulatory Visit: Payer: Self-pay | Admitting: Interventional Cardiology

## 2019-07-04 ENCOUNTER — Observation Stay (HOSPITAL_COMMUNITY): Payer: Medicare Other

## 2019-07-04 ENCOUNTER — Inpatient Hospital Stay (HOSPITAL_COMMUNITY)
Admission: EM | Admit: 2019-07-04 | Discharge: 2019-07-10 | DRG: 388 | Disposition: A | Discharge status: Home / Self Care | Payer: Medicare Other | Attending: Internal Medicine | Admitting: Internal Medicine

## 2019-07-04 ENCOUNTER — Encounter (HOSPITAL_COMMUNITY): Payer: Self-pay

## 2019-07-04 ENCOUNTER — Emergency Department (HOSPITAL_COMMUNITY): Payer: Medicare Other

## 2019-07-04 ENCOUNTER — Other Ambulatory Visit: Payer: Self-pay

## 2019-07-04 DIAGNOSIS — I2510 Atherosclerotic heart disease of native coronary artery without angina pectoris: Secondary | ICD-10-CM | POA: Diagnosis present

## 2019-07-04 DIAGNOSIS — Z7901 Long term (current) use of anticoagulants: Secondary | ICD-10-CM

## 2019-07-04 DIAGNOSIS — Z8261 Family history of arthritis: Secondary | ICD-10-CM

## 2019-07-04 DIAGNOSIS — R270 Ataxia, unspecified: Secondary | ICD-10-CM | POA: Diagnosis not present

## 2019-07-04 DIAGNOSIS — K56609 Unspecified intestinal obstruction, unspecified as to partial versus complete obstruction: Secondary | ICD-10-CM | POA: Diagnosis present

## 2019-07-04 DIAGNOSIS — E785 Hyperlipidemia, unspecified: Secondary | ICD-10-CM | POA: Diagnosis present

## 2019-07-04 DIAGNOSIS — Z955 Presence of coronary angioplasty implant and graft: Secondary | ICD-10-CM

## 2019-07-04 DIAGNOSIS — R55 Syncope and collapse: Secondary | ICD-10-CM | POA: Diagnosis present

## 2019-07-04 DIAGNOSIS — R4182 Altered mental status, unspecified: Secondary | ICD-10-CM

## 2019-07-04 DIAGNOSIS — E1142 Type 2 diabetes mellitus with diabetic polyneuropathy: Secondary | ICD-10-CM | POA: Diagnosis present

## 2019-07-04 DIAGNOSIS — R42 Dizziness and giddiness: Secondary | ICD-10-CM | POA: Diagnosis not present

## 2019-07-04 DIAGNOSIS — I252 Old myocardial infarction: Secondary | ICD-10-CM

## 2019-07-04 DIAGNOSIS — Z20828 Contact with and (suspected) exposure to other viral communicable diseases: Secondary | ICD-10-CM | POA: Diagnosis present

## 2019-07-04 DIAGNOSIS — R410 Disorientation, unspecified: Secondary | ICD-10-CM | POA: Diagnosis not present

## 2019-07-04 DIAGNOSIS — I5042 Chronic combined systolic (congestive) and diastolic (congestive) heart failure: Secondary | ICD-10-CM | POA: Diagnosis present

## 2019-07-04 DIAGNOSIS — I480 Paroxysmal atrial fibrillation: Secondary | ICD-10-CM | POA: Diagnosis present

## 2019-07-04 DIAGNOSIS — Z79891 Long term (current) use of opiate analgesic: Secondary | ICD-10-CM

## 2019-07-04 DIAGNOSIS — M1990 Unspecified osteoarthritis, unspecified site: Secondary | ICD-10-CM | POA: Diagnosis present

## 2019-07-04 DIAGNOSIS — E1140 Type 2 diabetes mellitus with diabetic neuropathy, unspecified: Secondary | ICD-10-CM | POA: Diagnosis present

## 2019-07-04 DIAGNOSIS — G4733 Obstructive sleep apnea (adult) (pediatric): Secondary | ICD-10-CM | POA: Diagnosis present

## 2019-07-04 DIAGNOSIS — M21372 Foot drop, left foot: Secondary | ICD-10-CM | POA: Diagnosis present

## 2019-07-04 DIAGNOSIS — K5651 Intestinal adhesions [bands], with partial obstruction: Principal | ICD-10-CM | POA: Diagnosis present

## 2019-07-04 DIAGNOSIS — Z66 Do not resuscitate: Secondary | ICD-10-CM | POA: Diagnosis present

## 2019-07-04 DIAGNOSIS — Z981 Arthrodesis status: Secondary | ICD-10-CM

## 2019-07-04 DIAGNOSIS — Z87891 Personal history of nicotine dependence: Secondary | ICD-10-CM

## 2019-07-04 DIAGNOSIS — R140 Abdominal distension (gaseous): Secondary | ICD-10-CM | POA: Diagnosis present

## 2019-07-04 DIAGNOSIS — K219 Gastro-esophageal reflux disease without esophagitis: Secondary | ICD-10-CM | POA: Diagnosis present

## 2019-07-04 DIAGNOSIS — M549 Dorsalgia, unspecified: Secondary | ICD-10-CM | POA: Diagnosis present

## 2019-07-04 DIAGNOSIS — M21371 Foot drop, right foot: Secondary | ICD-10-CM | POA: Diagnosis present

## 2019-07-04 DIAGNOSIS — G9341 Metabolic encephalopathy: Secondary | ICD-10-CM

## 2019-07-04 DIAGNOSIS — E1165 Type 2 diabetes mellitus with hyperglycemia: Secondary | ICD-10-CM | POA: Diagnosis present

## 2019-07-04 DIAGNOSIS — E860 Dehydration: Secondary | ICD-10-CM | POA: Diagnosis present

## 2019-07-04 DIAGNOSIS — M722 Plantar fascial fibromatosis: Secondary | ICD-10-CM | POA: Diagnosis present

## 2019-07-04 DIAGNOSIS — IMO0002 Reserved for concepts with insufficient information to code with codable children: Secondary | ICD-10-CM | POA: Diagnosis present

## 2019-07-04 DIAGNOSIS — Z833 Family history of diabetes mellitus: Secondary | ICD-10-CM

## 2019-07-04 DIAGNOSIS — E1149 Type 2 diabetes mellitus with other diabetic neurological complication: Secondary | ICD-10-CM

## 2019-07-04 DIAGNOSIS — Z8249 Family history of ischemic heart disease and other diseases of the circulatory system: Secondary | ICD-10-CM

## 2019-07-04 DIAGNOSIS — Z79899 Other long term (current) drug therapy: Secondary | ICD-10-CM

## 2019-07-04 DIAGNOSIS — G8929 Other chronic pain: Secondary | ICD-10-CM | POA: Diagnosis present

## 2019-07-04 DIAGNOSIS — Z0189 Encounter for other specified special examinations: Secondary | ICD-10-CM

## 2019-07-04 DIAGNOSIS — Z8679 Personal history of other diseases of the circulatory system: Secondary | ICD-10-CM

## 2019-07-04 DIAGNOSIS — F22 Delusional disorders: Secondary | ICD-10-CM | POA: Diagnosis present

## 2019-07-04 DIAGNOSIS — E876 Hypokalemia: Secondary | ICD-10-CM | POA: Diagnosis not present

## 2019-07-04 DIAGNOSIS — Z9049 Acquired absence of other specified parts of digestive tract: Secondary | ICD-10-CM

## 2019-07-04 DIAGNOSIS — Z7984 Long term (current) use of oral hypoglycemic drugs: Secondary | ICD-10-CM

## 2019-07-04 DIAGNOSIS — I509 Heart failure, unspecified: Secondary | ICD-10-CM

## 2019-07-04 DIAGNOSIS — Z888 Allergy status to other drugs, medicaments and biological substances status: Secondary | ICD-10-CM

## 2019-07-04 DIAGNOSIS — I110 Hypertensive heart disease with heart failure: Secondary | ICD-10-CM | POA: Diagnosis present

## 2019-07-04 LAB — BASIC METABOLIC PANEL
Anion gap: 15 (ref 5–15)
BUN: 46 mg/dL — ABNORMAL HIGH (ref 8–23)
CO2: 23 mmol/L (ref 22–32)
Calcium: 8.6 mg/dL — ABNORMAL LOW (ref 8.9–10.3)
Chloride: 98 mmol/L (ref 98–111)
Creatinine, Ser: 1.11 mg/dL (ref 0.61–1.24)
GFR calc Af Amer: >60 mL/min (ref >60)
GFR calc non Af Amer: >60 mL/min (ref >60)
Glucose, Bld: 151 mg/dL — ABNORMAL HIGH (ref 70–99)
Potassium: 4.2 mmol/L (ref 3.5–5.1)
Sodium: 136 mmol/L (ref 135–145)

## 2019-07-04 LAB — COMPREHENSIVE METABOLIC PANEL
ALT: 23 U/L (ref 0–44)
AST: 20 U/L (ref 15–41)
Albumin: 3.6 g/dL (ref 3.5–5.0)
Alkaline Phosphatase: 58 U/L (ref 38–126)
Anion gap: 18 — ABNORMAL HIGH (ref 5–15)
BUN: 50 mg/dL — ABNORMAL HIGH (ref 8–23)
CO2: 18 mmol/L — ABNORMAL LOW (ref 22–32)
Calcium: 8.7 mg/dL — ABNORMAL LOW (ref 8.9–10.3)
Chloride: 95 mmol/L — ABNORMAL LOW (ref 98–111)
Creatinine, Ser: 1.21 mg/dL (ref 0.61–1.24)
GFR calc Af Amer: >60 mL/min (ref >60)
GFR calc non Af Amer: 56 mL/min — ABNORMAL LOW (ref >60)
Glucose, Bld: 206 mg/dL — ABNORMAL HIGH (ref 70–99)
Potassium: 4.2 mmol/L (ref 3.5–5.1)
Sodium: 131 mmol/L — ABNORMAL LOW (ref 135–145)
Total Bilirubin: 1.2 mg/dL (ref 0.3–1.2)
Total Protein: 6.9 g/dL (ref 6.5–8.1)

## 2019-07-04 LAB — GLUCOSE, CAPILLARY
Glucose-Capillary: 112 mg/dL — ABNORMAL HIGH (ref 70–99)
Glucose-Capillary: 109 mg/dL — ABNORMAL HIGH (ref 70–99)
Glucose-Capillary: 92 mg/dL (ref 70–99)

## 2019-07-04 LAB — CBC WITH DIFFERENTIAL/PLATELET
Abs Immature Granulocytes: 0.08 10*3/uL — ABNORMAL HIGH (ref 0.00–0.07)
Basophils Absolute: 0 10*3/uL (ref 0.0–0.1)
Basophils Relative: 0 %
Eosinophils Absolute: 0 10*3/uL (ref 0.0–0.5)
Eosinophils Relative: 0 %
HCT: 42.5 % (ref 39.0–52.0)
Hemoglobin: 14.8 g/dL (ref 13.0–17.0)
Immature Granulocytes: 1 %
Lymphocytes Relative: 8 %
Lymphs Abs: 0.8 10*3/uL (ref 0.7–4.0)
MCH: 31.6 pg (ref 26.0–34.0)
MCHC: 34.8 g/dL (ref 30.0–36.0)
MCV: 90.6 fL (ref 80.0–100.0)
Monocytes Absolute: 2.9 10*3/uL — ABNORMAL HIGH (ref 0.1–1.0)
Monocytes Relative: 26 %
Neutro Abs: 7.2 10*3/uL (ref 1.7–7.7)
Neutrophils Relative %: 65 %
Platelets: 175 10*3/uL (ref 150–400)
RBC: 4.69 MIL/uL (ref 4.22–5.81)
RDW: 14.2 % (ref 11.5–15.5)
WBC: 11 10*3/uL — ABNORMAL HIGH (ref 4.0–10.5)
nRBC: 0 % (ref 0.0–0.2)

## 2019-07-04 LAB — CBG MONITORING, ED
Glucose-Capillary: 197 mg/dL — ABNORMAL HIGH (ref 70–99)
Glucose-Capillary: 169 mg/dL — ABNORMAL HIGH (ref 70–99)
Glucose-Capillary: 137 mg/dL — ABNORMAL HIGH (ref 70–99)
Glucose-Capillary: 125 mg/dL — ABNORMAL HIGH (ref 70–99)
Glucose-Capillary: 119 mg/dL — ABNORMAL HIGH (ref 70–99)

## 2019-07-04 LAB — URINALYSIS, ROUTINE W REFLEX MICROSCOPIC
Bacteria, UA: NONE SEEN
Bilirubin Urine: NEGATIVE
Glucose, UA: >=500 mg/dL — AB
Ketones, ur: 20 mg/dL — AB
Leukocytes,Ua: NEGATIVE
Nitrite: NEGATIVE
Protein, ur: NEGATIVE mg/dL
RBC / HPF: >50 RBC/hpf — ABNORMAL HIGH (ref 0–5)
Specific Gravity, Urine: 1.02 (ref 1.005–1.030)
pH: 5 (ref 5.0–8.0)

## 2019-07-04 LAB — RAPID URINE DRUG SCREEN, HOSP PERFORMED
Amphetamines: NOT DETECTED
Barbiturates: NOT DETECTED
Benzodiazepines: POSITIVE — AB
Cocaine: NOT DETECTED
Opiates: NOT DETECTED
Tetrahydrocannabinol: NOT DETECTED

## 2019-07-04 LAB — HEMOGLOBIN A1C
Hgb A1c MFr Bld: 7.3 % — ABNORMAL HIGH (ref 4.8–5.6)
Mean Plasma Glucose: 162.81 mg/dL

## 2019-07-04 LAB — PROTIME-INR
INR: 3.9 — ABNORMAL HIGH (ref 0.8–1.2)
Prothrombin Time: 37.8 seconds — ABNORMAL HIGH (ref 11.4–15.2)

## 2019-07-04 LAB — APTT: aPTT: 43 seconds — ABNORMAL HIGH (ref 24–36)

## 2019-07-04 LAB — MAGNESIUM: Magnesium: 2.2 mg/dL (ref 1.7–2.4)

## 2019-07-04 LAB — SARS CORONAVIRUS 2 (TAT 6-24 HRS): SARS Coronavirus 2: NEGATIVE

## 2019-07-04 MED ORDER — IOHEXOL 300 MG/ML  SOLN
30.0000 mL | Freq: Once | INTRAMUSCULAR | Status: AC | PRN
  Administered 2019-07-04: 30 mL via ORAL

## 2019-07-04 MED ORDER — SODIUM CHLORIDE (PF) 0.9 % IJ SOLN
INTRAMUSCULAR | Status: AC
  Filled 2019-07-04: qty 50

## 2019-07-04 MED ORDER — THIAMINE HCL 100 MG/ML IJ SOLN
100.0000 mg | Freq: Every day | INTRAMUSCULAR | Status: DC
  Administered 2019-07-05 – 2019-07-10 (×6): 100 mg via INTRAVENOUS
  Filled 2019-07-04 (×6): qty 2

## 2019-07-04 MED ORDER — METFORMIN HCL ER 500 MG PO TB24
1000.0000 mg | ORAL_TABLET | Freq: Every day | ORAL | Status: DC
  Filled 2019-07-04: qty 2

## 2019-07-04 MED ORDER — DIAZEPAM 5 MG PO TABS
5.0000 mg | ORAL_TABLET | Freq: Every evening | ORAL | Status: DC | PRN
  Administered 2019-07-07 – 2019-07-09 (×3): 5 mg via ORAL
  Filled 2019-07-04 (×3): qty 1

## 2019-07-04 MED ORDER — ACETAMINOPHEN 325 MG PO TABS
650.0000 mg | ORAL_TABLET | Freq: Four times a day (QID) | ORAL | Status: DC | PRN
  Administered 2019-07-07 – 2019-07-09 (×3): 650 mg via ORAL
  Filled 2019-07-04 (×3): qty 2

## 2019-07-04 MED ORDER — METOPROLOL TARTRATE 5 MG/5ML IV SOLN
5.0000 mg | Freq: Three times a day (TID) | INTRAVENOUS | Status: DC
  Administered 2019-07-04 – 2019-07-09 (×17): 5 mg via INTRAVENOUS
  Filled 2019-07-04 (×17): qty 5

## 2019-07-04 MED ORDER — ATORVASTATIN CALCIUM 10 MG PO TABS
10.0000 mg | ORAL_TABLET | Freq: Every day | ORAL | Status: DC
  Filled 2019-07-04: qty 1

## 2019-07-04 MED ORDER — METFORMIN HCL ER 500 MG PO TB24: 500.0000 mg | ORAL_TABLET | Freq: Every day | ORAL | Status: DC

## 2019-07-04 MED ORDER — GLIMEPIRIDE 1 MG PO TABS
1.0000 mg | ORAL_TABLET | Freq: Every day | ORAL | Status: DC
  Filled 2019-07-04: qty 1

## 2019-07-04 MED ORDER — SODIUM CHLORIDE 0.45 % IV SOLN
INTRAVENOUS | Status: DC
  Administered 2019-07-04: 07:00:00 via INTRAVENOUS

## 2019-07-04 MED ORDER — OXYCODONE-ACETAMINOPHEN 5-325 MG PO TABS: 1.0000 | ORAL_TABLET | Freq: Four times a day (QID) | ORAL | Status: DC | PRN

## 2019-07-04 MED ORDER — RAMIPRIL 5 MG PO CAPS
5.0000 mg | ORAL_CAPSULE | Freq: Every day | ORAL | Status: DC
  Filled 2019-07-04: qty 1

## 2019-07-04 MED ORDER — IOHEXOL 300 MG/ML  SOLN
100.0000 mL | Freq: Once | INTRAMUSCULAR | Status: AC | PRN
  Administered 2019-07-04: 100 mL via INTRAVENOUS

## 2019-07-04 MED ORDER — PANTOPRAZOLE SODIUM 40 MG IV SOLR
40.0000 mg | INTRAVENOUS | Status: DC
  Administered 2019-07-04 – 2019-07-10 (×7): 40 mg via INTRAVENOUS
  Filled 2019-07-04 (×7): qty 40

## 2019-07-04 MED ORDER — LIDOCAINE 5 % EX OINT
TOPICAL_OINTMENT | Freq: Once | CUTANEOUS | Status: AC
  Administered 2019-07-04: 05:00:00 via TOPICAL
  Filled 2019-07-04: qty 35.44

## 2019-07-04 MED ORDER — CANAGLIFLOZIN 100 MG PO TABS: 100.0000 mg | ORAL_TABLET | Freq: Every day | ORAL | Status: DC

## 2019-07-04 MED ORDER — VITAMIN B-12 1000 MCG PO TABS
1000.0000 ug | ORAL_TABLET | Freq: Every day | ORAL | Status: DC
  Filled 2019-07-04: qty 1

## 2019-07-04 MED ORDER — SODIUM CHLORIDE 0.9 % IV SOLN
Freq: Once | INTRAVENOUS | Status: AC
  Administered 2019-07-04: 05:00:00 via INTRAVENOUS

## 2019-07-04 MED ORDER — AMITRIPTYLINE HCL 25 MG PO TABS
25.0000 mg | ORAL_TABLET | Freq: Every day | ORAL | Status: DC
  Administered 2019-07-04 – 2019-07-09 (×6): 25 mg via ORAL
  Filled 2019-07-04 (×6): qty 1

## 2019-07-04 MED ORDER — RIVAROXABAN 20 MG PO TABS: 20.0000 mg | ORAL_TABLET | Freq: Every day | ORAL | Status: DC

## 2019-07-04 MED ORDER — DIATRIZOATE MEGLUMINE & SODIUM 66-10 % PO SOLN
90.0000 mL | Freq: Once | ORAL | Status: AC
  Administered 2019-07-05: 90 mL via NASOGASTRIC
  Filled 2019-07-04: qty 90

## 2019-07-04 MED ORDER — METFORMIN HCL ER 500 MG PO TB24: 500.0000 mg | ORAL_TABLET | ORAL | Status: DC

## 2019-07-04 MED ORDER — AMIODARONE HCL 200 MG PO TABS
200.0000 mg | ORAL_TABLET | Freq: Two times a day (BID) | ORAL | Status: DC
  Administered 2019-07-04 – 2019-07-10 (×13): 200 mg via ORAL
  Filled 2019-07-04 (×16): qty 1

## 2019-07-04 MED ORDER — MORPHINE SULFATE (PF) 2 MG/ML IV SOLN
1.0000 mg | INTRAVENOUS | Status: DC | PRN
  Administered 2019-07-07: 1 mg via INTRAVENOUS
  Filled 2019-07-04: qty 1

## 2019-07-04 MED ORDER — INSULIN ASPART 100 UNIT/ML ~~LOC~~ SOLN
0.0000 [IU] | SUBCUTANEOUS | Status: DC
  Administered 2019-07-04: 2 [IU] via SUBCUTANEOUS
  Administered 2019-07-06: 1 [IU] via SUBCUTANEOUS
  Administered 2019-07-06: 2 [IU] via SUBCUTANEOUS
  Administered 2019-07-07 (×3): 1 [IU] via SUBCUTANEOUS
  Administered 2019-07-07 – 2019-07-08 (×3): 2 [IU] via SUBCUTANEOUS
  Administered 2019-07-08 (×2): 1 [IU] via SUBCUTANEOUS
  Administered 2019-07-09: 2 [IU] via SUBCUTANEOUS
  Administered 2019-07-09: 3 [IU] via SUBCUTANEOUS
  Administered 2019-07-09: 1 [IU] via SUBCUTANEOUS
  Administered 2019-07-09 (×2): 2 [IU] via SUBCUTANEOUS
  Administered 2019-07-10: 5 [IU] via SUBCUTANEOUS
  Administered 2019-07-10: 2 [IU] via SUBCUTANEOUS
  Filled 2019-07-04: qty 0.09

## 2019-07-04 MED ORDER — ACETAMINOPHEN 650 MG RE SUPP: 650.0000 mg | Freq: Four times a day (QID) | RECTAL | Status: DC | PRN

## 2019-07-04 MED ORDER — PREGABALIN 75 MG PO CAPS
300.0000 mg | ORAL_CAPSULE | Freq: Every day | ORAL | Status: DC
  Administered 2019-07-04 – 2019-07-10 (×7): 300 mg via ORAL
  Filled 2019-07-04 (×2): qty 4
  Filled 2019-07-04: qty 6
  Filled 2019-07-04 (×5): qty 4

## 2019-07-04 MED ORDER — SODIUM CHLORIDE 0.9 % IV SOLN
INTRAVENOUS | Status: DC
  Administered 2019-07-04 – 2019-07-06 (×3): via INTRAVENOUS

## 2019-07-04 MED ORDER — SODIUM CHLORIDE 0.9 % IV BOLUS
500.0000 mL | Freq: Once | INTRAVENOUS | Status: AC
  Administered 2019-07-04: 500 mL via INTRAVENOUS

## 2019-07-04 MED ORDER — METOPROLOL TARTRATE 25 MG PO TABS: 50.0000 mg | ORAL_TABLET | Freq: Two times a day (BID) | ORAL | Status: DC

## 2019-07-04 MED ORDER — PANTOPRAZOLE SODIUM 40 MG PO TBEC: 40.0000 mg | DELAYED_RELEASE_TABLET | Freq: Every day | ORAL | Status: DC

## 2019-07-04 NOTE — Progress Notes (Addendum)
I have seen and assessed patient and agree with Dr. Cloria Spring assessment and plan.  Patient is a 78 year old gentleman history of mild congestive heart failure, diabetes, A. fib on chronic anticoagulation, post open repair of abdominal aortic aneurysm in 2009, incisional hernia repair presenting to the ED with a 3-day history of abdominal distention, nausea and vomiting.  Patient also noted to be orthostatic with presyncopal episodes prior to admission.  Abdominal films done consistent with distal small bowel obstruction.  Will place patient on bowel rest.  General surgery has been consulted and is assessed patient and recommending small bowel protocol, NG tube placement.  Hold anticoagulation.  Will get a CT abdomen and pelvis given small bowel obstruction and history of weight loss.  MRI head pending.  Discontinue oral hypoglycemic agents.  Place on sliding scale insulin.  IV fluids.  Supportive care.  [name]  No charge.

## 2019-07-04 NOTE — ED Notes (Signed)
MRI Called

## 2019-07-04 NOTE — Progress Notes (Signed)
Canagliflozin contraindications:  Urinary tract infection   Acute renal failure   eGFR less than 45 QA/ESL/7.53 m2   Metabolic acidosis (including Diabetic ketoacidosis))  NPO status   Dehydration   Volume depletion  Plan: Invokana dc'd due to dehydration. Please reorder when resolved  Eudelia Bunch, Pharm.D [phone] 07/04/2019 7:58 AM

## 2019-07-04 NOTE — Progress Notes (Signed)
Patient just admitted and there are multiple tests scheduled at once requiring medications through his tube.  Clarified w/ night on call NP and will attempt to administer contrast via tube first for CT, and then attempt gastrographen medication for study (depending on patient tolerance). This was not started in ED when ordered.[name]

## 2019-07-04 NOTE — ED Notes (Signed)
Nurse Secretary has paged MRI.  Patient has metal in back. Patient is not claustrophobic

## 2019-07-04 NOTE — ED Notes (Signed)
Message sent to pharmacy to verify and send medications due to uncertainty of how long patient will be holding in ED.

## 2019-07-04 NOTE — ED Notes (Signed)
Per MRI - once results are back for the Covid test please page again for scan. MRI is on call today and tomorrow for the holiday.

## 2019-07-04 NOTE — Consult Note (Addendum)
NEURO HOSPITALIST  CONSULT   Requesting Physician: Dr. Grandville Silos     Chief Complaint: balance/ AMS  History obtained from:  Patient     HPI:                                                                                                                                         [name] is an 78 y.o. male with PMH significant for DM II, p. A fib (on xarelto), MI, neuropathy, HTN, HLD, AAA( 2009), CHF ( COMBINED), who presented to [facility] ed for near syncope, loss of balance and dehydration. Neurology consulted for balance and AMS.    patient and wife report that over the past 3 days he has had abdominal distention with decreased food  And fluid intake. He has peripheral neuropathy and chronic imbalance that has been worse these past 3 days. He also has almost fainted. Per chart: Wife states that the patient has been confused, disoriented, confabulating.  No prior stroke history. Patient does not smoke. Endorses alcohol use and taking xarelto without missing any doses. covid swab pending. Patient also c/o decreased taste and smell for about the past 3 months. Patient denies any weakness, numbness tingling, SOB, vision changes.     ED course:  CTH: no hemorrhage. BP: 108/57 BG: 206 BUN: 50 UDS: + benzo's NA: 131 abd x-ray :Small bowel obstruction   Date last known well: 3 days ago 07-01-2019 tPA Given: on xarelto and presented outside of window Modified Rankin: Rankin Score=1 NIHSS:1 leg drift   Past Medical History:  Diagnosis Date  . AAA (abdominal aortic aneurysm) ([place])    a. s/p repair 2009.  [name] Acid reflux    takes Prilosec and Protonix daily  . Arthritis    BacK  . CAD in native artery    a. a. prior inf MI 1993. b. PCI to LAD 05/1997. c. recurrent inferolateral MI complicated by V. fib arrest in 04/1998, prior PCI to OM1. d. 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  [name] Chronic combined systolic and diastolic CHF (congestive heart failure)  ([place])   . Complication of anesthesia    -years ago hair fell out.;ileus after 2 of his surgeries  . Dry skin   . Foot drop, bilateral   . Hypercholesteremia   . Hypertension   . Ileus ([place])    After AAA  . Incisional hernia    "small, from AAA"  . Ischemic cardiomyopathy   . MI (myocardial infarction) ([place])   . 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 OSA on CPAP    uses CPAP--sleep study done at  least 54yrs ago  . PAF (paroxysmal atrial fibrillation) ([place])   . Pain    back pain chronic- seen at pain clinic  . Plantar fasciitis    bilatetral  . Thrombocytopenia ([place])   . Type II diabetes mellitus ([place])     Past Surgical History:  Procedure Laterality Date  . ABDOMINAL AORTIC ANEURYSM REPAIR    . BACK SURGERY  2012-2013 X 3   Miminal Invasive x 3in [place].  [name] CARDIAC CATHETERIZATION  2009/2012  . CARDIOVERSION N/A 04/12/2015   Procedure: CARDIOVERSION;  Surgeon: Sueanne Margarita, MD;  Location: [facility];  Service: Cardiovascular;  Laterality: N/A;  . CARDIOVERSION N/A 12/26/2016   Procedure: CARDIOVERSION;  Surgeon: Sueanne Margarita, MD;  Location: [facility];  Service: Cardiovascular;  Laterality: N/A;  . COLONOSCOPY    . CORONARY ANGIOPLASTY WITH STENT PLACEMENT  1992; 1997  . LAPAROSCOPIC CHOLECYSTECTOMY    . LUMBAR LAMINECTOMY/DECOMPRESSION MICRODISCECTOMY Right 01/16/2016   Procedure: Right Lumbar five-Sacral one Laminectomy;  Surgeon: Kristeen Miss, MD;  Location: [place] [facility];  Service: Neurosurgery;  Laterality: Right;  Right L5-S1 Laminectomy    Family History  Problem Relation Age of Onset  . Diabetes Mother   . Coronary artery disease Mother   . Diabetes Father   . Arthritis Father   . Rheum arthritis Father   . Coronary artery disease Father        Social History:  reports that he quit smoking about 27 years ago. His smoking use included cigarettes. He has a 33.00 pack-year smoking history. He has never used smokeless tobacco. He reports current alcohol use. He  reports that he does not use drugs.  Allergies:  Allergies  Allergen Reactions  . Cymbalta [Duloxetine Hcl] Nausea And Vomiting and Other (See Comments)    PATIENT STATED THAT HE FELT LIKE HE "WAS GOING TO DIE" Rapid drop in blood pressure; sent him to the ER X2  . Neurontin [Gabapentin] Other (See Comments)    "Makes me goofy, keeps me off balance, clouds my thinking.."  . Keppra [Levetiracetam] Nausea And Vomiting    Medications:                                                                                                                           Current Facility-Administered Medications  Medication Dose Route Frequency Provider Last Rate Last Dose  . 0.45 % sodium chloride infusion   Intravenous Continuous Norins, Heinz Knuckles, MD 75 mL/hr at 07/04/19 0704    . acetaminophen (TYLENOL) tablet 650 mg  650 mg Oral Q6H PRN Norins, Heinz Knuckles, MD       Or  . acetaminophen (TYLENOL) suppository 650 mg  650 mg Rectal Q6H PRN Norins, Heinz Knuckles, MD      . amiodarone (PACERONE) tablet 200 mg  200 mg Oral BID Norins, Heinz Knuckles, MD      . amitriptyline (ELAVIL) tablet 25 mg  25 mg Oral QHS Norins, Heinz Knuckles, MD      .  atorvastatin (LIPITOR) tablet 10 mg  10 mg Oral Daily Norins, Heinz Knuckles, MD      . diazepam (VALIUM) tablet 5 mg  5 mg Oral QHS PRN Norins, Heinz Knuckles, MD      . glimepiride (AMARYL) tablet 1 mg  1 mg Oral Q breakfast Norins, Heinz Knuckles, MD      . metFORMIN (GLUCOPHAGE-XR) 24 hr tablet 1,000 mg  1,000 mg Oral Q breakfast Eugenie Filler, MD       And  . metFORMIN (GLUCOPHAGE-XR) 24 hr tablet 500 mg  500 mg Oral Q supper Eugenie Filler, MD      . metoprolol tartrate (LOPRESSOR) tablet 50 mg  50 mg Oral BID Norins, Heinz Knuckles, MD      . oxyCODONE-acetaminophen (PERCOCET/ROXICET) 5-325 MG per tablet 1 tablet  1 tablet Oral Q6H PRN Norins, Heinz Knuckles, MD      . pantoprazole (PROTONIX) EC tablet 40 mg  40 mg Oral Daily Norins, Heinz Knuckles, MD      . pregabalin (LYRICA) capsule 300 mg   300 mg Oral Daily Norins, Heinz Knuckles, MD      . ramipril (ALTACE) capsule 5 mg  5 mg Oral Daily Norins, Heinz Knuckles, MD      . rivaroxaban (XARELTO) tablet 20 mg  20 mg Oral Q supper Norins, Heinz Knuckles, MD      . vitamin B-12 (CYANOCOBALAMIN) tablet 1,000 mcg  1,000 mcg Oral Daily Norins, Heinz Knuckles, MD       Current Outpatient Medications  Medication Sig Dispense Refill  . amiodarone (PACERONE) 200 MG tablet Take 1 tablet by mouth once daily (Patient taking differently: Take 200 mg by mouth 2 (two) times daily. ) 90 tablet 2  . atorvastatin (LIPITOR) 10 MG tablet Take 10 mg by mouth daily.    . Coenzyme Q10 (CO Q 10) 100 MG CAPS Take 100 mg by mouth daily.    . diazepam (VALIUM) 5 MG tablet Take 5 mg by mouth at bedtime as needed (leg cramps/sleep).     . empagliflozin (JARDIANCE) 10 MG TABS tablet Take 10 mg by mouth daily with breakfast.     . glimepiride (AMARYL) 1 MG tablet Take 1 mg by mouth daily with breakfast.    . metFORMIN (GLUCOPHAGE-XR) 500 MG 24 hr tablet Take 500-1,000 mg by mouth See admin instructions. 1,000 mg in the morning and 500 mg in the evening    . metoprolol tartrate (LOPRESSOR) 50 MG tablet Take 1 tablet by mouth twice daily (Patient taking differently: Take 50 mg by mouth 2 (two) times daily. ) 180 tablet 0  . Multiple Vitamins-Minerals (ONE-A-DAY MENS 50+ ADVANTAGE) TABS Take 1 tablet by mouth daily.    [name] omeprazole (PRILOSEC) 20 MG capsule Take 20 mg by mouth daily.    [name] oxyCODONE-acetaminophen (PERCOCET/ROXICET) 5-325 MG tablet Take 1 tablet by mouth every 6 (six) hours as needed (pain).     . pregabalin (LYRICA) 300 MG capsule Take 1 capsule by mouth 2 (two) times a day.    . ramipril (ALTACE) 5 MG capsule Take 1 capsule (5 mg total) by mouth daily. 30 capsule 6  . rivaroxaban (XARELTO) 20 MG TABS tablet Take 20 mg by mouth daily with supper.    . vitamin B-12 (CYANOCOBALAMIN) 1000 MCG tablet Take 1,000 mcg by mouth daily.     [name] amitriptyline (ELAVIL) 25 MG tablet  Take 25 mg by mouth at bedtime.        ROS:  ROS was performed and is negative except as noted in HPI    General Examination:                                                                                                      Blood pressure (!) 108/57, pulse 81, temperature 97.8 F (36.6 C), temperature source Oral, resp. rate 17, height 6\' 1"  (1.854 m), weight 88.5 kg, SpO2 93 %.  HEENT-  Normocephalic, no lesions, without obvious abnormality.  Normal external eye and conjunctiva. Cardiovascular-  pulses palpable throughout  Lungs-, no excessive work of breathing.  Saturations within normal limits on RA Abdomen-  non tender Extremities- Warm, dry and intact Musculoskeletal-BLE swelling Skin-warm and dry, intact. Bilateral feet with swollen shiny appearance to skin.   Neurological Examination Mental Status: Alert, oriented name/age/month/year/ place/ situation,  naming intact. thought content appropriate.  Speech fluent without evidence of aphasia.  Able to follow  commands without difficulty. Cranial Nerves: OE:UMPNTI fields grossly normal,  III,IV, VI: ptosis not present, extra-ocular motions intact bilaterally, pupils equal, round, reactive to light and accommodation V,VII: smile symmetric, facial light touch sensation normal bilaterally VIII: hearing normal bilaterally IX,X: uvula rises midline XI: bilateral shoulder shrug XII: midline tongue extension Motor: Right : Upper extremity   5/5  Left:     Upper extremity   5/5  Lower extremity   5/5   Lower extremity   5/5 Tone and bulk:normal tone throughout; no atrophy noted Sensory: light touch intact throughout BUE. BLE both feet unable to feel, but did say there was a burning sensation when testing plantars. Deep Tendon Reflexes: 2+ and symmetric biceps  0- trace patellar and 0  achilles Plantars: Right: mute   Left: mute Cerebellar: Non ataxic FNF, HTS Gait: reported as unsteady.   Lab Results: Basic Metabolic Panel: Recent Labs  Lab 07/04/19 0218  NA 131*  K 4.2  CL 95*  CO2 18*  GLUCOSE 206*  BUN 50*  CREATININE 1.21  CALCIUM 8.7*    CBC: Recent Labs  Lab 07/04/19 0218  WBC 11.0*  NEUTROABS 7.2  HGB 14.8  HCT 42.5  MCV 90.6  PLT 175    CBG: Recent Labs  Lab 07/04/19 0232  GLUCAP 197*    Imaging: Dg Abd 1 View  Result Date: 07/04/2019 CLINICAL DATA:  Abdominal distention for 3 days. Anorexia. EXAM: ABDOMEN - 1 VIEW COMPARISON:  04/26/2016 FINDINGS: Moderately dilated small bowel loops are seen in the lower abdomen, with paucity of colonic gas. These findings are suspicious for a distal small bowel obstruction. Right upper quadrant surgical clips from prior cholecystectomy, and lumbar spine fusion hardware noted. Probable tiny adjacent less than 5 mm left renal calculi again noted. No definite radiopaque calculi seen along the course of the ureters or bladder. IMPRESSION: Findings suspicious for distal small bowel obstruction. Probable tiny nonobstructing left renal calculi. Electronically Signed   By: Marlaine Hind M.D.   On: 07/04/2019 07:44   Ct Head Wo Contrast  Result Date: 07/04/2019 CLINICAL DATA:  Initial evaluation for acute ataxia, stroke suspected. EXAM: CT  HEAD WITHOUT CONTRAST TECHNIQUE: Contiguous axial images were obtained from the base of the skull through the vertex without intravenous contrast. COMPARISON:  Prior CT from 06/02/2018. FINDINGS: Brain: Generalized age-related cerebral atrophy with mild chronic small vessel ischemic disease. No acute intracranial hemorrhage. No acute large vessel territory infarct. No mass lesion, midline shift or mass effect. No hydrocephalus. No extra-axial fluid collection. Vascular: No hyperdense vessel. Scattered vascular calcifications noted within the carotid siphons. Skull: Scalp soft  tissues and calvarium within normal limits. Sinuses/Orbits: Globes and orbital soft tissues demonstrate no acute finding. Trace layering opacity noted within the right sphenoid sinus. Paranasal sinuses are otherwise clear. No mastoid effusion. Other: Noted. IMPRESSION: 1. No acute intracranial abnormality. 2. Generalized age-related cerebral atrophy with mild chronic small vessel ischemic disease. Electronically Signed   By: Jeannine Boga M.D.   On: 07/04/2019 04:05       Laurey Morale, MSN, NP-C [facility] [phone]  07/04/2019, 8:31 AM   Attending physician note to follow with Assessment and plan .   Assessment: 78 y.o. male  PMH significant for DM II, p. A fib (on xarelto), MI, neuropathy, HTN, HLD, AAA( 2009), CHF ( COMBINED), who presented to [facility] ed for near syncope, loss of balance and dehydration. Neurology consulted for balance and AMS. CTH: no hemorrhage. MRI ordered. covid swab pending. PA not offered d/t xarelto and presenting outside of window.   Stroke Risk Factors - atrial fibrillation, diabetes mellitus, hyperlipidemia and hypertension    Recommendations: --MRI Brain  --IVF --Telemetry monitoring --Frequent neuro checks -- correct metabolic derangements ( primary team)  --please page stroke NP  Or  PA  Or MD from 8am -4 pm  as this patient from this time will be  followed by the stroke.   You can look them up on  [email or web address]  Password [email or web address]  I have seen the patient and reviewed the above note.  His mental status appears to have improved.  He has a chronic neuropathy and I suspect that his unsteadiness was related to worsening of his neurological deficits due to his general medical condition (bowel obstruction).  If an MRI is negative, then I would do no further neurological work-up.  One other consideration is that I do not see a B12 has ever been checked, and since this would be relatively simple I would favor checking it.  Also anytime  unsteadiness and confusion is in consideration in someone who is not taking orals, I favor thiamine supplementation though my suspicion that thiamine is related to this presentation is very low.  1) MRI brain 2) B12, if less than 250 parenteral repletion, if 250-400, send methylmalonic acid and replete pending the test 3) thiamine 100 mg daily 4) if MRI is negative, neurology will sign off.  Roland Rack, MD [facility] [phone]  If 7pm- 7am, please page neurology on call as listed in [place].

## 2019-07-04 NOTE — ED Notes (Signed)
Patient scooted out of bed to use restroom without calling for assistance. Patient is being changed right now along with bed sheets.

## 2019-07-04 NOTE — H&P (Signed)
History and Physical    [name] UXN:[id number] DOB: June 29, 1941 DOA: 07/04/2019  PCP: [name], MD (Confirm with patient/family/NH records and if not entered, this has to be entered at [facility] point of entry) Patient coming from: Patient is coming from home  I have personally briefly reviewed patient's old medical records in [place]  Chief Complaint: Near syncope, loss of balance, mental status changes  HPI: [name] is a 78 y.o. male with medical history significant of specialization September 2019 for dehydration loss of balance and near syncope.  He is followed for CAD, mild congestive heart failure, diabetes, atrial fibrillation on anticoagulation.  Patient reports for the last 3 months he has had a significant change in his ability to taste and this has affected his appetite.  Per the patient, corroborated by his wife, over the last 3 days he has had significant abdominal distention with decreased intake of food and fluids.  He has a peripheral neuropathy and chronic imbalance but this is been much worse over the last 3 days.  He has had an episode of near syncope.  Per patient's wife the patient has been delusional, confused has been confabulating and has been disoriented.  These are new behaviors for him.  Because of this plethora of symptoms he presents to the emergency department for evaluation.  ED Course: Patient was hemodynamically stable in the emergency department.  The of the brain was unremarkable.  Patient was orthostatic.  Laboratory results suggested dehydration with a rising BUN and creatinine.  Physical exam was nonfocal from a neurologic perspective.  She was referred for admission to evaluate his balance, for rehydration, and to assess his abdominal distention.  Review of Systems: As per HPI otherwise 10 point review of systems negative.    Past Medical History:  Diagnosis Date  . AAA (abdominal aortic aneurysm) ([place])    a. s/p repair 2009.  [name] Acid reflux     takes Prilosec and Protonix daily  . Arthritis    BacK  . CAD in native artery    a. a. prior inf MI 1993. b. PCI to LAD 05/1997. c. recurrent inferolateral MI complicated by V. fib arrest in 04/1998, prior PCI to OM1. d. 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  [name] Chronic combined systolic and diastolic CHF (congestive heart failure) ([place])   . Complication of anesthesia    -years ago hair fell out.;ileus after 2 of his surgeries  . Dry skin   . Foot drop, bilateral   . Hypercholesteremia   . Hypertension   . Ileus ([place])    After AAA  . Incisional hernia    "small, from AAA"  . Ischemic cardiomyopathy   . MI (myocardial infarction) ([place])   . 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 OSA on CPAP    uses CPAP--sleep study done at least 23yrs ago  . PAF (paroxysmal atrial fibrillation) ([place])   . Pain    back pain chronic- seen at pain clinic  . Plantar fasciitis    bilatetral  . Thrombocytopenia ([place])   . Type II diabetes mellitus ([place])     Past Surgical History:  Procedure Laterality Date  . ABDOMINAL AORTIC ANEURYSM REPAIR    . BACK SURGERY  2012-2013 X 3   Miminal Invasive x 3in [place].  [name] CARDIAC CATHETERIZATION  2009/2012  . CARDIOVERSION N/A 04/12/2015   Procedure: CARDIOVERSION;  Surgeon: Sueanne Margarita, MD;  Location: [facility];  Service: Cardiovascular;  Laterality: N/A;  . CARDIOVERSION N/A 12/26/2016   Procedure: CARDIOVERSION;  Surgeon: Sueanne Margarita, MD;  Location: [facility];  Service: Cardiovascular;  Laterality: N/A;  . COLONOSCOPY    . CORONARY ANGIOPLASTY WITH STENT PLACEMENT  1992; 1997  . LAPAROSCOPIC CHOLECYSTECTOMY    . LUMBAR LAMINECTOMY/DECOMPRESSION MICRODISCECTOMY Right 01/16/2016   Procedure: Right Lumbar five-Sacral one Laminectomy;  Surgeon: Kristeen Miss, MD;  Location: [facility];  Service: Neurosurgery;  Laterality: Right;  Right L5-S1 Laminectomy   Social history -patient's been married 20 years to his second wife.  He has 3 daughters and 1 stepdaughter  and 9 grandchildren.  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   reports that he quit smoking about 27 years ago. His smoking use included cigarettes. He has a 33.00 pack-year smoking history. He has never used smokeless tobacco. He reports current alcohol use. He reports that he does not use drugs.  Allergies  Allergen Reactions  . Cymbalta [Duloxetine Hcl] Nausea And Vomiting and Other (See Comments)    PATIENT STATED THAT HE FELT LIKE HE "WAS GOING TO DIE" Rapid drop in blood pressure; sent him to the ER X2  . Neurontin [Gabapentin] Other (See Comments)    "Makes me goofy, keeps me off balance, clouds my thinking.."  . Keppra [Levetiracetam] Nausea And Vomiting    Family History  Problem Relation Age of Onset  . Diabetes Mother   . Coronary artery disease Mother   . Diabetes Father   . Arthritis Father   . Rheum arthritis Father   . Coronary artery disease Father    Unacceptable: Noncontributory, unremarkable, or negative. Acceptable: Family history reviewed and not pertinent (If you reviewed it)  Prior to Admission medications   Medication Sig Start Date End Date Taking? Authorizing Provider  amiodarone (PACERONE) 200 MG tablet Take 1 tablet by mouth once daily Patient taking differently: Take 200 mg by mouth 2 (two) times daily.  06/03/19  Yes Belva Crome, MD  atorvastatin (LIPITOR) 10 MG tablet Take 10 mg by mouth daily.   Yes [provider]  Coenzyme Q10 (CO Q 10) 100 MG CAPS Take 100 mg by mouth daily.   Yes [provider]  diazepam (VALIUM) 5 MG tablet Take 5 mg by mouth at bedtime as needed (leg cramps/sleep).  10/31/15  Yes [provider]  empagliflozin (JARDIANCE) 10 MG TABS tablet Take 10 mg by mouth daily with breakfast.    Yes [provider]  glimepiride (AMARYL) 1 MG tablet Take 1 mg by mouth daily with breakfast.   Yes [provider]   metFORMIN (GLUCOPHAGE-XR) 500 MG 24 hr tablet Take 500-1,000 mg by mouth See admin instructions. 1,000 mg in the morning and 500 mg in the evening 10/12/15  Yes [provider]  metoprolol tartrate (LOPRESSOR) 50 MG tablet Take 1 tablet by mouth twice daily Patient taking differently: Take 50 mg by mouth 2 (two) times daily.  05/10/19  Yes Belva Crome, MD  Multiple Vitamins-Minerals (ONE-A-DAY MENS 50+ ADVANTAGE) TABS Take 1 tablet by mouth daily.   Yes [provider]  omeprazole (PRILOSEC) 20 MG capsule Take 20 mg by mouth daily. 09/14/12  Yes [provider]  oxyCODONE-acetaminophen (PERCOCET/ROXICET) 5-325 MG tablet Take 1 tablet by mouth every 6 (six) hours as needed (pain).    Yes [provider]  pregabalin (LYRICA) 300 MG capsule Take 1 capsule by mouth 2 (  two) times a day. 02/16/19  Yes [provider]  ramipril (ALTACE) 5 MG capsule Take 1 capsule (5 mg total) by mouth daily. 06/02/18  Yes Burtis Junes, NP  rivaroxaban (XARELTO) 20 MG TABS tablet Take 20 mg by mouth daily with supper.   Yes [provider]  vitamin B-12 (CYANOCOBALAMIN) 1000 MCG tablet Take 1,000 mcg by mouth daily.    Yes [provider]  amitriptyline (ELAVIL) 25 MG tablet Take 25 mg by mouth at bedtime.  12/11/18   [provider]    Physical Exam: Vitals:   07/04/19 0508 07/04/19 0530 07/04/19 0549 07/04/19 0630  BP:  (!) 123/55 93/72 (!) 91/39  Pulse:  85 83 83  Resp:  16 18 18   Temp:      TempSrc:      SpO2:  96% 96% 96%  Weight: 88.5 kg     Height: 6\' 1"  (1.854 m)       Constitutional: NAD, calm, comfortable Vitals:   07/04/19 0508 07/04/19 0530 07/04/19 0549 07/04/19 0630  BP:  (!) 123/55 93/72 (!) 91/39  Pulse:  85 83 83  Resp:  16 18 18   Temp:      TempSrc:      SpO2:  96% 96% 96%  Weight: 88.5 kg     Height: 6\' 1"  (1.854 m)      General appearance: An elderly man in no acute distress  eyes: PERRL, lids and  conjunctivae normal ENMT: Mucous membranes are moist. Posterior pharynx clear of any exudate or lesions.Normal dentition.  Neck: normal, supple, no masses, no thyromegaly Respiratory: clear to auscultation bilaterally, no wheezing, no crackles. Normal respiratory effort. No accessory muscle use.  Cardiovascular: Irregularly irregular rhythm, no murmurs / rubs / gallops. No extremity edema. 2+ pedal pulses. No carotid bruits.  Abdomen: Distended , no tenderness, no guarding, no rebound ,no masses palpated. No hepatosplenomegaly. Bowel sounds hypoactive with occasional rushes.  Musculoskeletal: no clubbing / cyanosis. No joint deformity upper and lower extremities. Good ROM, no contractures. Normal muscle tone.  Skin: Erythematous macular rash distal right lower extremity, no lesions, no ulcers. No induration Neurologic: CN 2-12 grossly intact. Sensation intact, . Strength 5/5 in all 4.  No tremor.  Patient was not stood or ambulated Psychiatric: Normal judgment and insight. Alert and oriented x 2 but not oriented to date.  Rambling speech normal mood.     Labs on Admission: I have personally reviewed following labs and imaging studies  CBC: Recent Labs  Lab 07/04/19 0218  WBC 11.0*  NEUTROABS 7.2  HGB 14.8  HCT 42.5  MCV 90.6  PLT 283   Basic Metabolic Panel: Recent Labs  Lab 07/04/19 0218  NA 131*  K 4.2  CL 95*  CO2 18*  GLUCOSE 206*  BUN 50*  CREATININE 1.21  CALCIUM 8.7*   GFR: Estimated Creatinine Clearance: 55 mL/min (by C-G formula based on SCr of 1.21 mg/dL). Liver Function Tests: Recent Labs  Lab 07/04/19 0218  AST 20  ALT 23  ALKPHOS 58  BILITOT 1.2  PROT 6.9  ALBUMIN 3.6   No results for input(s): LIPASE, AMYLASE in the last 168 hours. No results for input(s): AMMONIA in the last 168 hours. Coagulation Profile: Recent Labs  Lab 07/04/19 0257  INR 3.9*   Cardiac Enzymes: No results for input(s): CKTOTAL, CKMB, CKMBINDEX, TROPONINI in the last 168  hours. BNP (last 3 results) No results for input(s): PROBNP in the last 8760 hours. HbA1C: No  results for input(s): HGBA1C in the last 72 hours. CBG: Recent Labs  Lab 07/04/19 0232  GLUCAP 197*   Lipid Profile: No results for input(s): CHOL, HDL, LDLCALC, TRIG, CHOLHDL, LDLDIRECT in the last 72 hours. Thyroid Function Tests: No results for input(s): TSH, T4TOTAL, FREET4, T3FREE, THYROIDAB in the last 72 hours. Anemia Panel: No results for input(s): VITAMINB12, FOLATE, FERRITIN, TIBC, IRON, RETICCTPCT in the last 72 hours. Urine analysis:    Component Value Date/Time   COLORURINE YELLOW 12/21/2016 1850   APPEARANCEUR CLEAR 12/21/2016 1850   LABSPEC 1.027 12/21/2016 1850   PHURINE 5.0 12/21/2016 1850   GLUCOSEU >=500 (A) 12/21/2016 1850   HGBUR MODERATE (A) 12/21/2016 1850   BILIRUBINUR NEGATIVE 12/21/2016 1850   KETONESUR 5 (A) 12/21/2016 1850   PROTEINUR NEGATIVE 12/21/2016 1850   UROBILINOGEN 0.2 12/12/2014 0150   NITRITE NEGATIVE 12/21/2016 1850   LEUKOCYTESUR NEGATIVE 12/21/2016 1850    Radiological Exams on Admission: Ct Head Wo Contrast  Result Date: 07/04/2019 CLINICAL DATA:  Initial evaluation for acute ataxia, stroke suspected. EXAM: CT HEAD WITHOUT CONTRAST TECHNIQUE: Contiguous axial images were obtained from the base of the skull through the vertex without intravenous contrast. COMPARISON:  Prior CT from 06/02/2018. FINDINGS: Brain: Generalized age-related cerebral atrophy with mild chronic small vessel ischemic disease. No acute intracranial hemorrhage. No acute large vessel territory infarct. No mass lesion, midline shift or mass effect. No hydrocephalus. No extra-axial fluid collection. Vascular: No hyperdense vessel. Scattered vascular calcifications noted within the carotid siphons. Skull: Scalp soft tissues and calvarium within normal limits. Sinuses/Orbits: Globes and orbital soft tissues demonstrate no acute finding. Trace layering opacity noted within the right  sphenoid sinus. Paranasal sinuses are otherwise clear. No mastoid effusion. Other: Noted. IMPRESSION: 1. No acute intracranial abnormality. 2. Generalized age-related cerebral atrophy with mild chronic small vessel ischemic disease. Electronically Signed   By: Jeannine Boga M.D.   On: 07/04/2019 04:05    EKG: Independently reviewed.  EKG with an irregular rhythm that is accelerated junctional rhythm with intraventricular conduction delay.  Bigeminy is present.  Acute changes.  Assessment/Plan Active Problems:   Postural dizziness with presyncope   Abdominal distention, non-gaseous   DM (diabetes mellitus), type 2, uncontrolled w/neurologic complication (HCC)   Paroxysmal atrial fibrillation with RVR (HCC)   Chronic combined systolic and diastolic heart failure (HCC)   Near syncope   Change in mental status   Postural dizziness with near syncope   1.  Postural dizziness with presyncope -patient with chronic imbalance secondary to diabetic peripheral neuropathy.  He is now orthostatic and his imbalance is worse.  He has had lightheadedness but no frank syncope.  Symptoms are similar to what he experienced in September 2019.  Neurologic exam is nonfocal. Plan telemetry admission to monitor for any change in underlying heart rhythm  Gentle rehydration  Physical therapy consult for early to manage ambulation.  OT consult for ability to manage ADLs  2.  Chronic heart failure -patient has been stable and is followed by [name].  No evidence of decompensation on examination at admission.  Patient has been given a fluid challenge will be rehydrated. Plan BNP with next days labs  Chest x-ray for 24 hours of rehydration  3.  Abdominal distention -patient with abdominal distention however the abdomen is soft.  Bowel sounds are hypoactive with occasional rushes.  This is developed over the past 3 days and has affected his intake of both food and fluids.  Concern for partial small bowel  obstruction or ileus Plan KUB as initial evaluation.  May require CT of the abdomen for more definitive diagnosis  4.  Change in mental status -patient's wife reports there is been no significant change in mental status with increased confusion, delusion, hallucination and mild disorientation.  Concern for possible neurologic event. Plan treat dehydration as above  MRI brain to rule out vascular event  5.  Diabetes with neurologic complication -longstanding peripheral neuropathy.  We will continue the patient's home medical regimen  DVT prophylaxis: Xarelto (Lovenox/Heparin/SCD's/anticoagulated/None (if comfort care) Code Status: DNR/DNI (Full/Partial (specify details) Family Communication: Spoke with spouse reviewed the initial findings and treatment plan.  She agrees.  (Specify name, relationship. Do not write "discussed with patient". Specify tel # if discussed over the phone) Disposition Plan: Home in 48 hours to 3 days (specify when and where you expect patient to be discharged) Consults called: None (with names) Admission status: Telemetry (inpatient / obs / tele / medical floor / SDU)   Adella Hare MD [facility] Pager [phone]  If 7PM-7AM, please contact night-coverage [email or web address] Password [facility]  07/04/2019, 6:57 AM

## 2019-07-04 NOTE — Progress Notes (Signed)
2 bottles of CT contrast just placed down patient's NG tube and tube clamped (for upcoming ct scan). Patient tolerated well. CT notified that contrast was placed.  CT scan will be performed around 1.5-2 hours post contrast.

## 2019-07-04 NOTE — ED Notes (Signed)
ED TO INPATIENT HANDOFF REPORT  Name/Age/Gender [name] 78 y.o. male  Code Status    Code Status Orders  (From admission, onward)         Start     Ordered   07/04/19 0613  Full code  Continuous     07/04/19 0614        Code Status History    Date Active Date Inactive Code Status Order ID Comments User Context   05/08/2018 2026 05/09/2018 2134 Full Code [id number]  Etta Quill, DO ED   06/11/2017 1916 06/13/2017 2037 Full Code [id number]  Vianne Bulls, MD ED   12/21/2016 2156 12/26/2016 1803 Full Code [id number]  Etta Quill, DO ED   04/26/2016 1611 04/27/2016 2148 Full Code [id number]  Elgergawy, Silver Huguenin, MD Inpatient   12/12/2014 0412 12/14/2014 1703 Full Code [id number]  Rise Patience, MD Inpatient   11/07/2013 0351 11/08/2013 1159 Full Code [id number]  Orvan Falconer, MD ED   05/05/2012 1442 05/14/2012 1751 Full Code [id number]  Angela Adam, RN Inpatient   01/01/2012 1856 01/02/2012 1532 Full Code [id number]  Gomez Cleverly, RN Inpatient   Advance Care Planning Activity      Home/SNF/Other Home  Chief Complaint dehydration/weakness  Level of Care/Admitting Diagnosis ED Disposition    ED Disposition Condition [facility] Area: [facility] [[id number]]  Level of Care: Telemetry [5]  Admit to tele based on following criteria: Eval of Syncope  Covid Evaluation: Asymptomatic Screening Protocol (No Symptoms)  Diagnosis: Postural dizziness with near syncope [1540086]  Admitting Physician: Neena Rhymes [[id number]]  Attending Physician: Adella Hare E [[id number]]  PT Class (Do Not Modify): Observation [104]  PT Acc Code (Do Not Modify): Observation [10022]       Medical History Past Medical History:  Diagnosis Date  . AAA (abdominal aortic aneurysm) ([place])    a. s/p repair 2009.  [name] Acid reflux    takes Prilosec and Protonix daily  . Arthritis    BacK  . CAD in native artery    a. a. prior inf MI 1993. b. PCI to LAD  05/1997. c. recurrent inferolateral MI complicated by V. fib arrest in 04/1998, prior PCI to OM1. d. 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  [name] Chronic combined systolic and diastolic CHF (congestive heart failure) ([place])   . Complication of anesthesia    -years ago hair fell out.;ileus after 2 of his surgeries  . Dry skin   . Foot drop, bilateral   . Hypercholesteremia   . Hypertension   . Ileus ([place])    After AAA  . Incisional hernia    "small, from AAA"  . Ischemic cardiomyopathy   . MI (myocardial infarction) ([place])   . 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 OSA on CPAP    uses CPAP--sleep study done at least 51yrs ago  . PAF (paroxysmal atrial fibrillation) ([place])   . Pain    back pain chronic- seen at pain clinic  . Plantar fasciitis    bilatetral  . Thrombocytopenia ([place])   . Type II diabetes mellitus ([facility])     Allergies Allergies  Allergen Reactions  . Cymbalta [Duloxetine Hcl] Nausea And Vomiting and Other (See Comments)    PATIENT STATED THAT HE FELT LIKE HE "WAS GOING TO DIE" Rapid drop in blood pressure; sent him to the ER X2  . Neurontin [Gabapentin] Other (See Comments)    "Makes  me goofy, keeps me off balance, clouds my thinking.."  . Keppra [Levetiracetam] Nausea And Vomiting    IV Location/Drains/Wounds Patient Lines/Drains/Airways Status   Active Line/Drains/Airways    Name:   Placement date:   Placement time:   Site:   Days:   Peripheral IV 07/04/19 Right Forearm   07/04/19    1016    Forearm   less than 1   NG/OG Tube Nasogastric 18 Fr. Right nare Aucultation;Xray Documented cm marking at nare/ corner of mouth 55 cm   07/04/19    1715    Right nare   less than 1          Labs/Imaging Results for orders placed or performed during the hospital encounter of 07/04/19 (from the past 48 hour(s))  Comprehensive metabolic panel     Status: Abnormal   Collection Time: 07/04/19  2:18 AM  Result Value Ref Range   Sodium 131 (L) 135 - 145 mmol/L   Potassium 4.2 3.5 - 5.1  mmol/L   Chloride 95 (L) 98 - 111 mmol/L   CO2 18 (L) 22 - 32 mmol/L   Glucose, Bld 206 (H) 70 - 99 mg/dL   BUN 50 (H) 8 - 23 mg/dL   Creatinine, Ser 1.21 0.61 - 1.24 mg/dL   Calcium 8.7 (L) 8.9 - 10.3 mg/dL   Total Protein 6.9 6.5 - 8.1 g/dL   Albumin 3.6 3.5 - 5.0 g/dL   AST 20 15 - 41 U/L   ALT 23 0 - 44 U/L   Alkaline Phosphatase 58 38 - 126 U/L   Total Bilirubin 1.2 0.3 - 1.2 mg/dL   GFR calc non Af Amer 56 (L) >60 mL/min   GFR calc Af Amer >60 >60 mL/min   Anion gap 18 (H) 5 - 15    Comment: Performed at [facility], [place] [address], [place], [place] [address]  CBC with Differential     Status: Abnormal   Collection Time: 07/04/19  2:18 AM  Result Value Ref Range   WBC 11.0 (H) 4.0 - 10.5 K/uL   RBC 4.69 4.22 - 5.81 MIL/uL   Hemoglobin 14.8 13.0 - 17.0 g/dL   HCT 42.5 39.0 - 52.0 %   MCV 90.6 80.0 - 100.0 fL   MCH 31.6 26.0 - 34.0 pg   MCHC 34.8 30.0 - 36.0 g/dL   RDW 14.2 11.5 - 15.5 %   Platelets 175 150 - 400 K/uL   nRBC 0.0 0.0 - 0.2 %   Neutrophils Relative % 65 %   Neutro Abs 7.2 1.7 - 7.7 K/uL   Lymphocytes Relative 8 %   Lymphs Abs 0.8 0.7 - 4.0 K/uL   Monocytes Relative 26 %   Monocytes Absolute 2.9 (H) 0.1 - 1.0 K/uL   Eosinophils Relative 0 %   Eosinophils Absolute 0.0 0.0 - 0.5 K/uL   Basophils Relative 0 %   Basophils Absolute 0.0 0.0 - 0.1 K/uL   Immature Granulocytes 1 %   Abs Immature Granulocytes 0.08 (H) 0.00 - 0.07 K/uL    Comment: Performed at [facility], [place] [address] [place],  [address]  CBG monitoring, ED     Status: Abnormal   Collection Time: 07/04/19  2:32 AM  Result Value Ref Range   Glucose-Capillary 197 (H) 70 - 99 mg/dL  Protime-INR     Status: Abnormal   Collection Time: 07/04/19  2:57 AM  Result Value Ref Range   Prothrombin Time 37.8 (H)  11.4 - 15.2 seconds   INR 3.9 (H) 0.8 - 1.2    Comment: (NOTE) INR goal varies based on device and disease states. Performed at [facility], [place] [address], [place], [place] [address]   APTT     Status: Abnormal   Collection Time: 07/04/19  2:57 AM  Result Value Ref Range   aPTT 43 (H) 24 - 36 seconds    Comment:        IF BASELINE aPTT IS ELEVATED, SUGGEST PATIENT RISK ASSESSMENT BE USED TO DETERMINE APPROPRIATE ANTICOAGULANT THERAPY. Performed at [facility], [place] [address] [place], [place] [address]   SARS CORONAVIRUS 2 (TAT 6-24 HRS) Nasopharyngeal Nasopharyngeal Swab     Status: None   Collection Time: 07/04/19  5:14 AM   Specimen: Nasopharyngeal Swab  Result Value Ref Range   SARS Coronavirus 2 NEGATIVE NEGATIVE    Comment: (NOTE) SARS-CoV-2 target nucleic acids are NOT DETECTED. The SARS-CoV-2 RNA is generally detectable in upper and lower respiratory specimens during the acute phase of infection. Negative results do not preclude SARS-CoV-2 infection, do not rule out co-infections with other pathogens, and should not be used as the sole basis for treatment or other patient management decisions. Negative results must be combined with clinical observations, patient history, and epidemiological information. The expected result is Negative. Fact Sheet for Patients: SugarRoll.be Fact Sheet for Healthcare Providers: https://www.woods-mathews.com/ This test is not yet approved or cleared by the [place] FDA and  has been authorized for detection and/or diagnosis of SARS-CoV-2 by FDA under an Emergency Use Authorization (EUA). This EUA will remain  in effect (meaning this test can be used) for the duration of the COVID-19 declaration under Section 56 4(b)(1) of the Act, 21 U.S.C. section 360bbb-3(b)(1), unless the authorization is terminated or revoked sooner. Performed at [facility], [place] [address] [place], [place] [address]   Urinalysis, Routine w reflex microscopic     Status: Abnormal   Collection Time: 07/04/19   7:06 AM  Result Value Ref Range   Color, Urine YELLOW YELLOW   APPearance CLEAR CLEAR   Specific Gravity, Urine 1.020 1.005 - 1.030   pH 5.0 5.0 - 8.0   Glucose, UA >=500 (A) NEGATIVE mg/dL   Hgb urine dipstick LARGE (A) NEGATIVE   Bilirubin Urine NEGATIVE NEGATIVE   Ketones, ur 20 (A) NEGATIVE mg/dL   Protein, ur NEGATIVE NEGATIVE mg/dL   Nitrite NEGATIVE NEGATIVE   Leukocytes,Ua NEGATIVE NEGATIVE   RBC / HPF >50 (H) 0 - 5 RBC/hpf   WBC, UA 6-10 0 - 5 WBC/hpf   Bacteria, UA NONE SEEN NONE SEEN    Comment: Performed at [facility], [place] [address], [place], [place] [address]  Urine rapid drug screen (hosp performed)not at [facility]     Status: Abnormal   Collection Time: 07/04/19  7:06 AM  Result Value Ref Range   Opiates NONE DETECTED NONE DETECTED   Cocaine NONE DETECTED NONE DETECTED   Benzodiazepines POSITIVE (A) NONE DETECTED   Amphetamines NONE DETECTED NONE DETECTED   Tetrahydrocannabinol NONE DETECTED NONE DETECTED   Barbiturates NONE DETECTED NONE DETECTED    Comment: (NOTE) DRUG SCREEN FOR MEDICAL PURPOSES ONLY.  IF CONFIRMATION IS NEEDED FOR ANY PURPOSE, NOTIFY LAB WITHIN 5 DAYS. LOWEST DETECTABLE LIMITS FOR URINE DRUG SCREEN Drug Class                     Cutoff (ng/mL) Amphetamine and  metabolites    1000 Barbiturate and metabolites    200 Benzodiazepine                 481 Tricyclics and metabolites     300 Opiates and metabolites        300 Cocaine and metabolites        300 THC                            50 Performed at [facility], [place] [address] [place], [place] [address]   CBG monitoring, ED     Status: Abnormal   Collection Time: 07/04/19  9:58 AM  Result Value Ref Range   Glucose-Capillary 169 (H) 70 - 99 mg/dL  Magnesium     Status: None   Collection Time: 07/04/19 10:17 AM  Result Value Ref Range   Magnesium 2.2 1.7 - 2.4 mg/dL    Comment: Performed at [facility], [place] [address]  [place], [place] [address]  Basic metabolic panel     Status: Abnormal   Collection Time: 07/04/19 10:17 AM  Result Value Ref Range   Sodium 136 135 - 145 mmol/L   Potassium 4.2 3.5 - 5.1 mmol/L   Chloride 98 98 - 111 mmol/L   CO2 23 22 - 32 mmol/L   Glucose, Bld 151 (H) 70 - 99 mg/dL   BUN 46 (H) 8 - 23 mg/dL   Creatinine, Ser 1.11 0.61 - 1.24 mg/dL   Calcium 8.6 (L) 8.9 - 10.3 mg/dL   GFR calc non Af Amer >60 >60 mL/min   GFR calc Af Amer >60 >60 mL/min   Anion gap 15 5 - 15    Comment: Performed at [facility], [place] [address] [place] [address], [place], [place] [address]  CBG monitoring, ED     Status: Abnormal   Collection Time: 07/04/19 12:22 PM  Result Value Ref Range   Glucose-Capillary 137 (H) 70 - 99 mg/dL  CBG monitoring, ED     Status: Abnormal   Collection Time: 07/04/19  2:01 PM  Result Value Ref Range   Glucose-Capillary 125 (H) 70 - 99 mg/dL  CBG monitoring, ED     Status: Abnormal   Collection Time: 07/04/19  4:48 PM  Result Value Ref Range   Glucose-Capillary 119 (H) 70 - 99 mg/dL   Comment 1 Notify RN    Dg Abd 1 View  Result Date: 07/04/2019 CLINICAL DATA:  Abdominal distention for 3 days. Anorexia. EXAM: ABDOMEN - 1 VIEW COMPARISON:  04/26/2016 FINDINGS: Moderately dilated small bowel loops are seen in the lower abdomen, with paucity of colonic gas. These findings are suspicious for a distal small bowel obstruction. Right upper quadrant surgical clips from prior cholecystectomy, and lumbar spine fusion hardware noted. Probable tiny adjacent less than 5 mm left renal calculi again noted. No definite radiopaque calculi seen along the course of the ureters or bladder. IMPRESSION: Findings suspicious for distal small bowel obstruction. Probable tiny nonobstructing left renal calculi. Electronically Signed   By: Marlaine Hind M.D.   On: 07/04/2019 07:44   Ct Head Wo Contrast  Result Date: 07/04/2019 CLINICAL DATA:  Initial evaluation for acute ataxia, stroke suspected.  EXAM: CT HEAD WITHOUT CONTRAST TECHNIQUE: Contiguous axial images were obtained from the base of the skull through the vertex without intravenous contrast. COMPARISON:  Prior CT from 06/02/2018. FINDINGS: Brain: Generalized age-related cerebral atrophy with mild chronic small vessel  ischemic disease. No acute intracranial hemorrhage. No acute large vessel territory infarct. No mass lesion, midline shift or mass effect. No hydrocephalus. No extra-axial fluid collection. Vascular: No hyperdense vessel. Scattered vascular calcifications noted within the carotid siphons. Skull: Scalp soft tissues and calvarium within normal limits. Sinuses/Orbits: Globes and orbital soft tissues demonstrate no acute finding. Trace layering opacity noted within the right sphenoid sinus. Paranasal sinuses are otherwise clear. No mastoid effusion. Other: Noted. IMPRESSION: 1. No acute intracranial abnormality. 2. Generalized age-related cerebral atrophy with mild chronic small vessel ischemic disease. Electronically Signed   By: Jeannine Boga M.D.   On: 07/04/2019 04:05    Pending Labs Unresulted Labs (From admission, onward)    Start     Ordered   07/05/19 0708  Brain natriuretic peptide  Once,   R     07/04/19 0709   07/04/19 0918  Hemoglobin A1c  Once,   STAT    Comments: To assess prior glycemic control    07/04/19 0918          Vitals/Pain Today's Vitals   07/04/19 1600 07/04/19 1630 07/04/19 1700 07/04/19 1730  BP: 114/61 114/68 115/65 128/65  Pulse: 82 84 87 83  Resp: 19 17 17  (!) 21  Temp:      TempSrc:      SpO2: 98% 100% 97% 99%  Weight:      Height:      PainSc:        Isolation Precautions No active isolations  Medications Medications  oxyCODONE-acetaminophen (PERCOCET/ROXICET) 5-325 MG per tablet 1 tablet (has no administration in time range)  amiodarone (PACERONE) tablet 200 mg (200 mg Oral Given 07/04/19 1022)  amitriptyline (ELAVIL) tablet 25 mg (has no administration in time  range)  diazepam (VALIUM) tablet 5 mg (has no administration in time range)  pregabalin (LYRICA) capsule 300 mg (300 mg Oral Given 07/04/19 1023)  acetaminophen (TYLENOL) tablet 650 mg (has no administration in time range)    Or  acetaminophen (TYLENOL) suppository 650 mg (has no administration in time range)  0.9 %  sodium chloride infusion ( Intravenous Rate/Dose Change 07/04/19 1047)  metoprolol tartrate (LOPRESSOR) injection 5 mg (5 mg Intravenous Given 07/04/19 1204)  pantoprazole (PROTONIX) injection 40 mg (40 mg Intravenous Given 07/04/19 1029)  morphine 2 MG/ML injection 1 mg (has no administration in time range)  insulin aspart (novoLOG) injection 0-9 Units (0 Units Subcutaneous Not Given 07/04/19 1715)  diatrizoate meglumine-sodium (GASTROGRAFIN) 66-10 % solution 90 mL (0 mLs Per NG tube Hold 07/04/19 1407)  lidocaine (XYLOCAINE) 5 % ointment ( Topical Given 07/04/19 0518)  sodium chloride 0.9 % bolus 500 mL ( Intravenous Stopped 07/04/19 0620)  0.9 %  sodium chloride infusion ( Intravenous Stopped 07/04/19 0520)    Mobility walks with person assist

## 2019-07-04 NOTE — ED Notes (Signed)
Patient is using bedside commode at this time after eating Hot breakfast tray.

## 2019-07-04 NOTE — ED Notes (Signed)
Dr.Norins at bedside to assess pt.

## 2019-07-04 NOTE — ED Triage Notes (Addendum)
Pt reports feeling dehydrated and weak. States that he has spoken with his PCP who recommended to increase fluids and rest, he tried that yesterday, but still feels poorly. Reports decreased urine output. Pt also reports feeling disoriented yesterday. A&Ox4.   Wife also reports increased falls.

## 2019-07-04 NOTE — ED Notes (Signed)
Pt unable to ambulate at this time due to weakness. Dr. Kathrynn Humble notified.

## 2019-07-04 NOTE — Consult Note (Signed)
General Surgery [facility]  Reason for Consult: small bowel obstruction  Referring Physician: Dr. Irine Seal, [facility]  [name] is an 78 y.o. male.  HPI: Patient is an 78 year old male admitted to the medical service with 3-day history of abdominal discomfort, abdominal distention, nausea and vomiting.  Patient has a complex past medical history as noted in the admission note by the medical service.  Past surgical history includes open repair of abdominal aortic aneurysm by Dr. Sherren Mocha early in 2009.  Patient has undergone repair of an incisional hernia.  He has not had any episodes of small bowel obstruction until his current illness.  Patient developed symptoms approximately 3 days ago.  Pain was diffuse in the abdomen felt tight.  Patient complained of nausea and emesis.  Patient states that today there has been slight improvement.  He did have a bowel movement in the emergency department.  Abdominal x-ray in the emergency department demonstrated findings consistent with distal small bowel obstruction.  Patient has not had a CT scan of the abdomen.  Patient does note recent weight loss but is unsure how much weight he has lost.  General surgery is asked for consultation regarding small bowel obstruction.  Past Medical History:  Diagnosis Date  . AAA (abdominal aortic aneurysm) ([place])    a. s/p repair 2009.  [name] Acid reflux    takes Prilosec and Protonix daily  . Arthritis    BacK  . CAD in native artery    a. a. prior inf MI 1993. b. PCI to LAD 05/1997. c. recurrent inferolateral MI complicated by V. fib arrest in 04/1998, prior PCI to OM1. d. 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  [name] Chronic combined systolic and diastolic CHF (congestive heart failure) ([place])   . Complication of anesthesia    -years ago hair fell out.;ileus after 2 of his surgeries  . Dry skin   . Foot drop, bilateral   . Hypercholesteremia   . Hypertension   . Ileus ([place])    After AAA  .  Incisional hernia    "small, from AAA"  . Ischemic cardiomyopathy   . MI (myocardial infarction) ([place])   . 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 OSA on CPAP    uses CPAP--sleep study done at least 79yrs ago  . PAF (paroxysmal atrial fibrillation) ([place])   . Pain    back pain chronic- seen at pain clinic  . Plantar fasciitis    bilatetral  . Thrombocytopenia ([place])   . Type II diabetes mellitus ([place])     Past Surgical History:  Procedure Laterality Date  . ABDOMINAL AORTIC ANEURYSM REPAIR    . BACK SURGERY  2012-2013 X 3   [place]im[place] Invasive x 3in [place].  [name] CARDIAC CATHETERIZATION  2009/2012  . CARDIOVERSION N/A 04/12/2015   Procedure: CARDIOVERSION;  Surgeon: Sueanne Margarita, MD;  Location: [facility];  Service: Cardiovascular;  Laterality: N/A;  . CARDIOVERSION N/A 12/26/2016   Procedure: CARDIOVERSION;  Surgeon: Sueanne Margarita, MD;  Location: [facility];  Service: Cardiovascular;  Laterality: N/A;  . COLONOSCOPY    . CORONARY ANGIOPLASTY WITH STENT PLACEMENT  1992; 1997  . LAPAROSCOPIC CHOLECYSTECTOMY    . LUMBAR LAMINECTOMY/DECOMPRESSION MICRODISCECTOMY Right 01/16/2016   Procedure: Right Lumbar five-Sacral one Laminectomy;  Surgeon: Kristeen Miss, MD;  Location: [facility];  Service: Neurosurgery;  Laterality: Right;  Right L5-S1 Laminectomy    Family History  Problem Relation Age of Onset  .  Diabetes Mother   . Coronary artery disease Mother   . Diabetes Father   . Arthritis Father   . Rheum arthritis Father   . Coronary artery disease Father     Social History:  reports that he quit smoking about 27 years ago. His smoking use included cigarettes. He has a 33.00 pack-year smoking history. He has never used smokeless tobacco. He reports current alcohol use. He reports that he does not use drugs.  Allergies:  Allergies  Allergen Reactions  . Cymbalta [Duloxetine Hcl] Nausea And Vomiting and Other (See Comments)    PATIENT STATED THAT HE FELT LIKE HE "WAS GOING TO DIE"  Rapid drop in blood pressure; sent him to the ER X2  . Neurontin [Gabapentin] Other (See Comments)    "Makes me goofy, keeps me off balance, clouds my thinking.."  . Keppra [Levetiracetam] Nausea And Vomiting    Medications: I have reviewed the patient's current medications.  Results for orders placed or performed during the hospital encounter of 07/04/19 (from the past 48 hour(s))  Comprehensive metabolic panel     Status: Abnormal   Collection Time: 07/04/19  2:18 AM  Result Value Ref Range   Sodium 131 (L) 135 - 145 mmol/L   Potassium 4.2 3.5 - 5.1 mmol/L   Chloride 95 (L) 98 - 111 mmol/L   CO2 18 (L) 22 - 32 mmol/L   Glucose, Bld 206 (H) 70 - 99 mg/dL   BUN 50 (H) 8 - 23 mg/dL   Creatinine, Ser 1.21 0.61 - 1.24 mg/dL   Calcium 8.7 (L) 8.9 - 10.3 mg/dL   Total Protein 6.9 6.5 - 8.1 g/dL   Albumin 3.6 3.5 - 5.0 g/dL   AST 20 15 - 41 U/L   ALT 23 0 - 44 U/L   Alkaline Phosphatase 58 38 - 126 U/L   Total Bilirubin 1.2 0.3 - 1.2 mg/dL   GFR calc non Af Amer 56 (L) >60 mL/min   GFR calc Af Amer >60 >60 mL/min   Anion gap 18 (H) 5 - 15    Comment: Performed at [facility], [place] [address], [place], [place] [address]  CBC with Differential     Status: Abnormal   Collection Time: 07/04/19  2:18 AM  Result Value Ref Range   WBC 11.0 (H) 4.0 - 10.5 K/uL   RBC 4.69 4.22 - 5.81 MIL/uL   Hemoglobin 14.8 13.0 - 17.0 g/dL   HCT 42.5 39.0 - 52.0 %   MCV 90.6 80.0 - 100.0 fL   MCH 31.6 26.0 - 34.0 pg   MCHC 34.8 30.0 - 36.0 g/dL   RDW 14.2 11.5 - 15.5 %   Platelets 175 150 - 400 K/uL   nRBC 0.0 0.0 - 0.2 %   Neutrophils Relative % 65 %   Neutro Abs 7.2 1.7 - 7.7 K/uL   Lymphocytes Relative 8 %   Lymphs Abs 0.8 0.7 - 4.0 K/uL   Monocytes Relative 26 %   Monocytes Absolute 2.9 (H) 0.1 - 1.0 K/uL   Eosinophils Relative 0 %   Eosinophils Absolute 0.0 0.0 - 0.5 K/uL   Basophils Relative 0 %   Basophils Absolute 0.0 0.0 - 0.1 K/uL   Immature Granulocytes 1 %   Abs  Immature Granulocytes 0.08 (H) 0.00 - 0.07 K/uL    Comment: Performed at [facility], [place] [address], [place],  [address]  CBG monitoring, ED     Status: Abnormal  Collection Time: 07/04/19  2:32 AM  Result Value Ref Range   Glucose-Capillary 197 (H) 70 - 99 mg/dL  Protime-INR     Status: Abnormal   Collection Time: 07/04/19  2:57 AM  Result Value Ref Range   Prothrombin Time 37.8 (H) 11.4 - 15.2 seconds   INR 3.9 (H) 0.8 - 1.2    Comment: (NOTE) INR goal varies based on device and disease states. Performed at [facility], [place] [address], [place], [place] [address]   APTT     Status: Abnormal   Collection Time: 07/04/19  2:57 AM  Result Value Ref Range   aPTT 43 (H) 24 - 36 seconds    Comment:        IF BASELINE aPTT IS ELEVATED, SUGGEST PATIENT RISK ASSESSMENT BE USED TO DETERMINE APPROPRIATE ANTICOAGULANT THERAPY. Performed at [facility], [place] [address], [place], [place] [address]   Urinalysis, Routine w reflex microscopic     Status: Abnormal   Collection Time: 07/04/19  7:06 AM  Result Value Ref Range   Color, Urine YELLOW YELLOW   APPearance CLEAR CLEAR   Specific Gravity, Urine 1.020 1.005 - 1.030   pH 5.0 5.0 - 8.0   Glucose, UA >=500 (A) NEGATIVE mg/dL   Hgb urine dipstick LARGE (A) NEGATIVE   Bilirubin Urine NEGATIVE NEGATIVE   Ketones, ur 20 (A) NEGATIVE mg/dL   Protein, ur NEGATIVE NEGATIVE mg/dL   Nitrite NEGATIVE NEGATIVE   Leukocytes,Ua NEGATIVE NEGATIVE   RBC / HPF >50 (H) 0 - 5 RBC/hpf   WBC, UA 6-10 0 - 5 WBC/hpf   Bacteria, UA NONE SEEN NONE SEEN    Comment: Performed at [facility], [place] [address] [place], [place] [address]  Urine rapid drug screen (hosp performed)not at [facility]     Status: Abnormal   Collection Time: 07/04/19  7:06 AM  Result Value Ref Range   Opiates NONE DETECTED NONE DETECTED   Cocaine NONE DETECTED NONE DETECTED   Benzodiazepines POSITIVE  (A) NONE DETECTED   Amphetamines NONE DETECTED NONE DETECTED   Tetrahydrocannabinol NONE DETECTED NONE DETECTED   Barbiturates NONE DETECTED NONE DETECTED    Comment: (NOTE) DRUG SCREEN FOR MEDICAL PURPOSES ONLY.  IF CONFIRMATION IS NEEDED FOR ANY PURPOSE, NOTIFY LAB WITHIN 5 DAYS. LOWEST DETECTABLE LIMITS FOR URINE DRUG SCREEN Drug Class                     Cutoff (ng/mL) Amphetamine and metabolites    1000 Barbiturate and metabolites    200 Benzodiazepine                 194 Tricyclics and metabolites     300 Opiates and metabolites        300 Cocaine and metabolites        300 THC                            50 Performed at [facility], [place] [address] [place], [place] [address]   CBG monitoring, ED     Status: Abnormal   Collection Time: 07/04/19  9:58 AM  Result Value Ref Range   Glucose-Capillary 169 (H) 70 - 99 mg/dL  Magnesium     Status: None   Collection Time: 07/04/19 10:17 AM  Result Value Ref Range   Magnesium 2.2 1.7 - 2.4 mg/dL    Comment: Performed at [facility]  [place] [address] [place], [place] [address]  Basic metabolic panel     Status: Abnormal   Collection Time: 07/04/19 10:17 AM  Result Value Ref Range   Sodium 136 135 - 145 mmol/L   Potassium 4.2 3.5 - 5.1 mmol/L   Chloride 98 98 - 111 mmol/L   CO2 23 22 - 32 mmol/L   Glucose, Bld 151 (H) 70 - 99 mg/dL   BUN 46 (H) 8 - 23 mg/dL   Creatinine, Ser 1.11 0.61 - 1.24 mg/dL   Calcium 8.6 (L) 8.9 - 10.3 mg/dL   GFR calc non Af Amer >60 >60 mL/min   GFR calc Af Amer >60 >60 mL/min   Anion gap 15 5 - 15    Comment: Performed at [facility], [place] [address], [place], [place] [address]  CBG monitoring, ED     Status: Abnormal   Collection Time: 07/04/19 12:22 PM  Result Value Ref Range   Glucose-Capillary 137 (H) 70 - 99 mg/dL    Dg Abd 1 View  Result Date: 07/04/2019 CLINICAL DATA:  Abdominal distention for 3 days. Anorexia. EXAM: ABDOMEN - 1  VIEW COMPARISON:  04/26/2016 FINDINGS: Moderately dilated small bowel loops are seen in the lower abdomen, with paucity of colonic gas. These findings are suspicious for a distal small bowel obstruction. Right upper quadrant surgical clips from prior cholecystectomy, and lumbar spine fusion hardware noted. Probable tiny adjacent less than 5 mm left renal calculi again noted. No definite radiopaque calculi seen along the course of the ureters or bladder. IMPRESSION: Findings suspicious for distal small bowel obstruction. Probable tiny nonobstructing left renal calculi. Electronically Signed   By: Marlaine Hind M.D.   On: 07/04/2019 07:44   Ct Head Wo Contrast  Result Date: 07/04/2019 CLINICAL DATA:  Initial evaluation for acute ataxia, stroke suspected. EXAM: CT HEAD WITHOUT CONTRAST TECHNIQUE: Contiguous axial images were obtained from the base of the skull through the vertex without intravenous contrast. COMPARISON:  Prior CT from 06/02/2018. FINDINGS: Brain: Generalized age-related cerebral atrophy with mild chronic small vessel ischemic disease. No acute intracranial hemorrhage. No acute large vessel territory infarct. No mass lesion, midline shift or mass effect. No hydrocephalus. No extra-axial fluid collection. Vascular: No hyperdense vessel. Scattered vascular calcifications noted within the carotid siphons. Skull: Scalp soft tissues and calvarium within normal limits. Sinuses/Orbits: Globes and orbital soft tissues demonstrate no acute finding. Trace layering opacity noted within the right sphenoid sinus. Paranasal sinuses are otherwise clear. No mastoid effusion. Other: Noted. IMPRESSION: 1. No acute intracranial abnormality. 2. Generalized age-related cerebral atrophy with mild chronic small vessel ischemic disease. Electronically Signed   By: Jeannine Boga M.D.   On: 07/04/2019 04:05    Review of Systems  Constitutional: Positive for weight loss.  HENT: Negative.   Eyes: Negative.    Respiratory: Negative.   Cardiovascular: Positive for leg swelling.  Gastrointestinal: Positive for abdominal pain, nausea and vomiting.  Genitourinary: Negative.   Musculoskeletal: Negative.   Skin: Negative.   Endo/Heme/Allergies: Negative.   Psychiatric/Behavioral: Negative.    Blood pressure 107/83, pulse 81, temperature 97.8 F (36.6 C), temperature source Oral, resp. rate 18, height 6\' 1"  (1.854 m), weight 88.5 kg, SpO2 95 %. Physical Exam  Constitutional: He is oriented to person, place, and time. He appears well-developed and well-nourished. No distress.  HENT:  Head: Normocephalic and atraumatic.  Right Ear: External ear normal.  Left Ear: External ear normal.  Eyes: Pupils are equal, round,  and reactive to light. Conjunctivae are normal. No scleral icterus.  Neck: Normal range of motion. Neck supple. No tracheal deviation present. No thyromegaly present.  Cardiovascular: Normal rate, regular rhythm and normal heart sounds.  No murmur heard. Respiratory: Effort normal and breath sounds normal. No respiratory distress. He has no wheezes.  GI: Soft. He exhibits distension. He exhibits no mass. There is no abdominal tenderness. There is no rebound and no guarding.  High pitched bowel sounds; well healed midline abd incision; no sign of herniation  Musculoskeletal:        General: Edema present.  Neurological: He is alert and oriented to person, place, and time.  Skin: Skin is warm and dry. He is not diaphoretic.  Psychiatric: He has a normal mood and affect. His behavior is normal.    Assessment/Plan: Small bowel obstruction  Agree with admission on medical service  Hold anticoagulation for now  Small bowel protocol - place NG tube and follow outlined orders please  Consider CT scan of abd/pelvis given SBO and hx of weight loss  Will follow with you.  Armandina Gemma, [facility]: [place] 07/04/2019, 1:04 PM

## 2019-07-04 NOTE — ED Notes (Signed)
Patient transported to CT 

## 2019-07-04 NOTE — ED Notes (Signed)
MRI is aware of patient needing MRI however patient COVID has not resulted.

## 2019-07-04 NOTE — Progress Notes (Signed)
Pt. will be receiving CT and X ray's tonight. NP, notified that patient was not sent for MRI after covid tests resulted negative in Emergency Room earlier. NP said to go ahead and get CT and Xray tonight, Call for MRI in am.  Patient is alert and oriented w/ only complaints of taste issues and diabetic neuropathy.  Will monitor patient overnight. [name]

## 2019-07-04 NOTE — ED Provider Notes (Signed)
[facility] DEPT Provider Note   CSN: [id number] Arrival date & time: 07/04/19  0143     History   Chief Complaint Chief Complaint  Patient presents with  . Dehydration  . Near Syncope    HPI [name] is a 78 y.o. male.     HPI  78 year old male with history of CAD, AAA, A. fib on anticoagulation comes in a chief complaint of dehydration, dizziness.   Patient reports that over the past few days he has had episodes of dizziness.  Dizziness comes unprovoked, sometimes even when he is just watching TV and last for about 2 to 3 minutes.  Dizziness is described as lightheadedness and feeling like he cannot focus.  He denies syncope but thinks that he had near fainting-like spells.  He denies any associated chest pain, palpitations, shortness of breath.  Spoke with patient's wife who informed me that patient had 2 falls yesterday and 1 more fall tonight.  He has had balance issues leading to the fall.  She also states that he has been more confused and at one point he did not know who he was.  Patient states that he has pretty bad neuropathy in his toes and has not been able to sleep well because of it, leading to some delusions and dizziness.   Past Medical History:  Diagnosis Date  . AAA (abdominal aortic aneurysm) ([place])    a. s/p repair 2009.  [name] Acid reflux    takes Prilosec and Protonix daily  . Arthritis    BacK  . CAD in native artery    a. a. prior inf MI 1993. b. PCI to LAD 05/1997. c. recurrent inferolateral MI complicated by V. fib arrest in 04/1998, prior PCI to OM1. d. 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  [name] Chronic combined systolic and diastolic CHF (congestive heart failure) ([place])   . Complication of anesthesia    -years ago hair fell out.;ileus after 2 of his surgeries  . Dry skin   . Foot drop, bilateral   . Hypercholesteremia   . Hypertension   . Ileus ([place])    After AAA  . Incisional hernia    "small, from AAA"  . Ischemic  cardiomyopathy   . MI (myocardial infarction) ([place])   . 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 OSA on CPAP    uses CPAP--sleep study done at least 31yrs ago  . PAF (paroxysmal atrial fibrillation) ([place])   . Pain    back pain chronic- seen at pain clinic  . Plantar fasciitis    bilatetral  . Thrombocytopenia ([place])   . Type II diabetes mellitus [facility])     Patient Active Problem List   Diagnosis Date Noted  . Postural dizziness with presyncope 05/09/2018  . Hypotension 05/08/2018  . On amiodarone therapy 08/26/2017  . Near syncope 06/11/2017  . Hypoxia   . Pneumonia of left lower lobe due to infectious organism ([place])   . Gastroenteritis and colitis, viral 04/27/2016  . Orthostasis 04/26/2016  . Lumbar radiculopathy, chronic 01/16/2016  . Hematoma of left kidney 11/01/2015  . Hyperlipidemia 12/12/2014  . SIRS (systemic inflammatory response syndrome) ([place]) 12/12/2014  . Influenza due to identified novel influenza A virus with other respiratory manifestations 11/07/2013  . Long term current use of anticoagulant therapy 09/08/2013    Class: Chronic  . Coronary artery disease involving native coronary artery of native heart with angina pectoris ([place]) 08/16/2013    Class:  Chronic  . Chronic combined systolic and diastolic heart failure ([facility]) 08/16/2013    Class: Chronic  . Paroxysmal atrial fibrillation with RVR ([place]) 08/13/2013  . Paralytic ileus ([place]) 05/07/2012  . Nausea and vomiting 01/01/2012  . DM (diabetes mellitus), type 2, uncontrolled w/neurologic complication ([place]) 70/10/7492  . Hypertension   . AAA (abdominal aortic aneurysm) ([place])   . Acid reflux   . Sleep apnea     Past Surgical History:  Procedure Laterality Date  . ABDOMINAL AORTIC ANEURYSM REPAIR    . BACK SURGERY  2012-2013 X 3   Miminal Invasive x 3in [place].  [name] CARDIAC CATHETERIZATION  2009/2012  . CARDIOVERSION N/A 04/12/2015   Procedure: CARDIOVERSION;  Surgeon: Sueanne Margarita, MD;  Location: [facility];   Service: Cardiovascular;  Laterality: N/A;  . CARDIOVERSION N/A 12/26/2016   Procedure: CARDIOVERSION;  Surgeon: Sueanne Margarita, MD;  Location: [facility];  Service: Cardiovascular;  Laterality: N/A;  . COLONOSCOPY    . CORONARY ANGIOPLASTY WITH STENT PLACEMENT  1992; 1997  . LAPAROSCOPIC CHOLECYSTECTOMY    . LUMBAR LAMINECTOMY/DECOMPRESSION MICRODISCECTOMY Right 01/16/2016   Procedure: Right Lumbar five-Sacral one Laminectomy;  Surgeon: Kristeen Miss, MD;  Location: [facility];  Service: Neurosurgery;  Laterality: Right;  Right L5-S1 Laminectomy        Home Medications    Prior to Admission medications   Medication Sig Start Date End Date Taking? Authorizing Provider  amiodarone (PACERONE) 200 MG tablet Take 1 tablet by mouth once daily Patient taking differently: Take 200 mg by mouth 2 (two) times daily.  06/03/19  Yes Belva Crome, MD  atorvastatin (LIPITOR) 10 MG tablet Take 10 mg by mouth daily.   Yes [provider]  Coenzyme Q10 (CO Q 10) 100 MG CAPS Take 100 mg by mouth daily.   Yes [provider]  diazepam (VALIUM) 5 MG tablet Take 5 mg by mouth at bedtime as needed (leg cramps/sleep).  10/31/15  Yes [provider]  empagliflozin (JARDIANCE) 10 MG TABS tablet Take 10 mg by mouth daily with breakfast.    Yes [provider]  glimepiride (AMARYL) 1 MG tablet Take 1 mg by mouth daily with breakfast.   Yes [provider]  metFORMIN (GLUCOPHAGE-XR) 500 MG 24 hr tablet Take 500-1,000 mg by mouth See admin instructions. 1,000 mg in the morning and 500 mg in the evening 10/12/15  Yes [provider]  metoprolol tartrate (LOPRESSOR) 50 MG tablet Take 1 tablet by mouth twice daily Patient taking differently: Take 50 mg by mouth 2 (two) times daily.  05/10/19  Yes Belva Crome, MD  Multiple Vitamins-Minerals (ONE-A-DAY MENS 50+ ADVANTAGE) TABS Take 1 tablet by mouth daily.   Yes [provider]  omeprazole (PRILOSEC) 20 MG  capsule Take 20 mg by mouth daily. 09/14/12  Yes [provider]  oxyCODONE-acetaminophen (PERCOCET/ROXICET) 5-325 MG tablet Take 1 tablet by mouth every 6 (six) hours as needed (pain).    Yes [provider]  pregabalin (LYRICA) 300 MG capsule Take 1 capsule by mouth 2 (two) times a day. 02/16/19  Yes [provider]  ramipril (ALTACE) 5 MG capsule Take 1 capsule (5 mg total) by mouth daily. 06/02/18  Yes Burtis Junes, NP  rivaroxaban (XARELTO) 20 MG TABS tablet Take 20 mg by mouth daily with supper.   Yes [provider]  vitamin B-12 (CYANOCOBALAMIN) 1000 MCG tablet Take 1,000 mcg by mouth daily.    Yes [provider]  amitriptyline (ELAVIL) 25 MG tablet Take 25 mg by mouth at bedtime.  12/11/18   [provider]    Family History Family History  Problem Relation Age of Onset  . Diabetes Mother   . Coronary artery disease Mother   . Diabetes Father   . Arthritis Father   . Rheum arthritis Father   . Coronary artery disease Father     Social History Social History   Tobacco Use  . Smoking status: Former Smoker    Packs/day: 1.00    Years: 33.00    Pack years: 33.00    Types: Cigarettes    Quit date: 01/01/1992    Years since quitting: 27.5  . Smokeless tobacco: Never Used  Substance Use Topics  . Alcohol use: Yes    Comment: 6/30;2017 "GLASS OF WINE Q COUPLE MONTHS, IF THAT"  . Drug use: No     Allergies   Cymbalta [duloxetine hcl], Neurontin [gabapentin], and Keppra [levetiracetam]   Review of Systems Review of Systems  Constitutional: Positive for activity change.  Cardiovascular: Negative for chest pain.  Gastrointestinal: Positive for nausea.  Neurological: Positive for dizziness. Negative for syncope.  Hematological: Bruises/bleeds easily.  All other systems reviewed and are negative.   Physical Exam Updated Vital Signs BP (!) 123/55 (BP Location: Right Arm)   Pulse 85   Temp 97.8 F (36.6 C) (Oral)    Resp 16   Ht 6\' 1"  (1.854 m)   Wt 88.5 kg   SpO2 96%   BMI 25.73 kg/m   Physical Exam Vitals signs and nursing note reviewed.  Constitutional:      Appearance: He is well-developed.  HENT:     Head: Atraumatic.  Eyes:     Extraocular Movements: Extraocular movements intact.     Pupils: Pupils are equal, round, and reactive to light.  Neck:     Musculoskeletal: Neck supple.  Cardiovascular:     Rate and Rhythm: Normal rate.  Pulmonary:     Effort: Pulmonary effort is normal.  Skin:    General: Skin is warm.  Neurological:     General: No focal deficit present.     Mental Status: He is alert and oriented to person, place, and time.     Cranial Nerves: No cranial nerve deficit.     Sensory: No sensory deficit.     Motor: No weakness.     Coordination: Coordination normal.     ED Treatments / Results  Labs (all labs ordered are listed, but only abnormal results are displayed) Labs Reviewed  COMPREHENSIVE METABOLIC PANEL - Abnormal; Notable for the following components:      Result Value   Sodium 131 (*)    Chloride 95 (*)    CO2 18 (*)    Glucose, Bld 206 (*)    BUN 50 (*)    Calcium 8.7 (*)    GFR calc non Af Amer 56 (*)    Anion gap 18 (*)    All other components within normal limits  CBC WITH DIFFERENTIAL/PLATELET - Abnormal; Notable for the following components:   WBC 11.0 (*)    Monocytes Absolute 2.9 (*)    Abs Immature Granulocytes 0.08 (*)    All other components within normal limits  PROTIME-INR - Abnormal; Notable for the following components:   Prothrombin Time 37.8 (*)    INR 3.9 (*)    All other components within normal limits  APTT - Abnormal; Notable for the following components:   aPTT  43 (*)    All other components within normal limits  CBG MONITORING, ED - Abnormal; Notable for the following components:   Glucose-Capillary 197 (*)    All other components within normal limits  SARS CORONAVIRUS 2 (TAT 6-24 HRS)  URINALYSIS, ROUTINE W  REFLEX MICROSCOPIC  RAPID URINE DRUG SCREEN, HOSP PERFORMED    EKG EKG Interpretation  Date/Time:  Sunday July 04 2019 02:41:44 EDT Ventricular Rate:  87 PR Interval:    QRS Duration: 142 QT Interval:  431 QTC Calculation: 485 R Axis:   -47 Text Interpretation:  Accelerated junctional rhythm Ventricular bigeminy IVCD, consider atypical RBBB Inferolateral infarct, old bigeminy is new Confirmed by ,  ([id number]) on 07/04/2019 4:03:01 AM   Radiology Ct Head Wo Contrast  Result Date: 07/04/2019 CLINICAL DATA:  Initial evaluation for acute ataxia, stroke suspected. EXAM: CT HEAD WITHOUT CONTRAST TECHNIQUE: Contiguous axial images were obtained from the base of the skull through the vertex without intravenous contrast. COMPARISON:  Prior CT from 06/02/2018. FINDINGS: Brain: Generalized age-related cerebral atrophy with mild chronic small vessel ischemic disease. No acute intracranial hemorrhage. No acute large vessel territory infarct. No mass lesion, midline shift or mass effect. No hydrocephalus. No extra-axial fluid collection. Vascular: No hyperdense vessel. Scattered vascular calcifications noted within the carotid siphons. Skull: Scalp soft tissues and calvarium within normal limits. Sinuses/Orbits: Globes and orbital soft tissues demonstrate no acute finding. Trace layering opacity noted within the right sphenoid sinus. Paranasal sinuses are otherwise clear. No mastoid effusion. Other: Noted. IMPRESSION: 1. No acute intracranial abnormality. 2. Generalized age-related cerebral atrophy with mild chronic small vessel ischemic disease. Electronically Signed   By: Benjamin  McClintock M.D.   On: 07/04/2019 04:05    Procedures Procedures (including critical care time)  Medications Ordered in ED Medications  lidocaine (XYLOCAINE) 5 % ointment ( Topical Given 07/04/19 0518)  sodium chloride 0.9 % bolus 500 mL (500 mLs Intravenous New Bag/Given 07/04/19 0520)  0.9 %  sodium chloride  infusion ( Intravenous New Bag/Given 07/04/19 0519)     Initial Impression / Assessment and Plan / ED Course  I have reviewed the triage vital signs and the nursing notes.  Pertinent labs & imaging results that were available during my care of the patient were reviewed by me and considered in my medical decision making (see chart for details).       78  year old comes in a chief complaint of dizziness. He has history of AAA, CAD, A. fib.  No history of strokes.  No new medications.  Speaking to his wife, it seems like over the last 2 days patient has had multiple episodes of dizziness, ataxic gait and multiple falls.  Patient describes dizziness as lightheadedness and also having difficulty focusing.  No syncope, but there has been near syncope episodes.  Additionally the wife informs that patient has had episodes of confusion.  Patient has not been sleeping well because of his neuropathy and he thinks that all of his symptoms are related to that.  I think that there could be a component of orthostasis, infection or TIA-stroke.  Medications can also be impacting the symptoms if patient has new renal failure.  Plan is to get basic labs and orthostatics.  CT head also has been ordered.  5:47 AM Patient's lab values show elevated BUN to creatinine ratio.  Additionally he was orthostatic positive.  We will give him finder cc bolus followed by maintenance fluid right now.  It is unclear whether the symptoms are all related  to orthostasis or if there is a TIA-stroke component to it.  Neurology informed me that if stroke is high on the differential than he needs to be transferred to [facility].  I discussed these concerns and neuro recommendations with Dr. Alma Friendly, hospitalist.  He will assess the patient and decide if the patient needs transfer to [facility].   Final Clinical Impressions(s) / ED Diagnoses   Final diagnoses:  Orthostatic dizziness  Near syncope  Ataxia    ED Discharge Orders     None       Varney Biles, MD 07/04/19 [phone]

## 2019-07-04 NOTE — ED Notes (Signed)
Provider at bedside

## 2019-07-05 ENCOUNTER — Observation Stay (HOSPITAL_COMMUNITY): Payer: Medicare Other

## 2019-07-05 ENCOUNTER — Inpatient Hospital Stay (HOSPITAL_COMMUNITY): Payer: Medicare Other

## 2019-07-05 DIAGNOSIS — I252 Old myocardial infarction: Secondary | ICD-10-CM | POA: Diagnosis not present

## 2019-07-05 DIAGNOSIS — I480 Paroxysmal atrial fibrillation: Secondary | ICD-10-CM | POA: Diagnosis present

## 2019-07-05 DIAGNOSIS — M722 Plantar fascial fibromatosis: Secondary | ICD-10-CM | POA: Diagnosis present

## 2019-07-05 DIAGNOSIS — K5651 Intestinal adhesions [bands], with partial obstruction: Secondary | ICD-10-CM | POA: Diagnosis present

## 2019-07-05 DIAGNOSIS — E876 Hypokalemia: Secondary | ICD-10-CM | POA: Diagnosis present

## 2019-07-05 DIAGNOSIS — E1165 Type 2 diabetes mellitus with hyperglycemia: Secondary | ICD-10-CM | POA: Diagnosis present

## 2019-07-05 DIAGNOSIS — R42 Dizziness and giddiness: Secondary | ICD-10-CM | POA: Diagnosis not present

## 2019-07-05 DIAGNOSIS — E1149 Type 2 diabetes mellitus with other diabetic neurological complication: Secondary | ICD-10-CM | POA: Diagnosis not present

## 2019-07-05 DIAGNOSIS — I2510 Atherosclerotic heart disease of native coronary artery without angina pectoris: Secondary | ICD-10-CM | POA: Diagnosis present

## 2019-07-05 DIAGNOSIS — E860 Dehydration: Secondary | ICD-10-CM | POA: Diagnosis present

## 2019-07-05 DIAGNOSIS — G8929 Other chronic pain: Secondary | ICD-10-CM | POA: Diagnosis present

## 2019-07-05 DIAGNOSIS — I5042 Chronic combined systolic (congestive) and diastolic (congestive) heart failure: Secondary | ICD-10-CM | POA: Diagnosis present

## 2019-07-05 DIAGNOSIS — K219 Gastro-esophageal reflux disease without esophagitis: Secondary | ICD-10-CM | POA: Diagnosis present

## 2019-07-05 DIAGNOSIS — F22 Delusional disorders: Secondary | ICD-10-CM | POA: Diagnosis present

## 2019-07-05 DIAGNOSIS — M1990 Unspecified osteoarthritis, unspecified site: Secondary | ICD-10-CM | POA: Diagnosis present

## 2019-07-05 DIAGNOSIS — E785 Hyperlipidemia, unspecified: Secondary | ICD-10-CM | POA: Diagnosis present

## 2019-07-05 DIAGNOSIS — Z66 Do not resuscitate: Secondary | ICD-10-CM | POA: Diagnosis present

## 2019-07-05 DIAGNOSIS — E1142 Type 2 diabetes mellitus with diabetic polyneuropathy: Secondary | ICD-10-CM | POA: Diagnosis present

## 2019-07-05 DIAGNOSIS — I110 Hypertensive heart disease with heart failure: Secondary | ICD-10-CM | POA: Diagnosis present

## 2019-07-05 DIAGNOSIS — Z20828 Contact with and (suspected) exposure to other viral communicable diseases: Secondary | ICD-10-CM | POA: Diagnosis present

## 2019-07-05 DIAGNOSIS — M21371 Foot drop, right foot: Secondary | ICD-10-CM | POA: Diagnosis present

## 2019-07-05 DIAGNOSIS — K56609 Unspecified intestinal obstruction, unspecified as to partial versus complete obstruction: Secondary | ICD-10-CM | POA: Diagnosis not present

## 2019-07-05 DIAGNOSIS — M549 Dorsalgia, unspecified: Secondary | ICD-10-CM | POA: Diagnosis present

## 2019-07-05 DIAGNOSIS — G9341 Metabolic encephalopathy: Secondary | ICD-10-CM

## 2019-07-05 DIAGNOSIS — M21372 Foot drop, left foot: Secondary | ICD-10-CM | POA: Diagnosis present

## 2019-07-05 DIAGNOSIS — Z9049 Acquired absence of other specified parts of digestive tract: Secondary | ICD-10-CM | POA: Diagnosis not present

## 2019-07-05 DIAGNOSIS — I10 Essential (primary) hypertension: Secondary | ICD-10-CM

## 2019-07-05 DIAGNOSIS — G4733 Obstructive sleep apnea (adult) (pediatric): Secondary | ICD-10-CM | POA: Diagnosis present

## 2019-07-05 DIAGNOSIS — R140 Abdominal distension (gaseous): Secondary | ICD-10-CM | POA: Diagnosis present

## 2019-07-05 LAB — CBC WITH DIFFERENTIAL/PLATELET
Abs Immature Granulocytes: 0.1 10*3/uL — ABNORMAL HIGH (ref 0.00–0.07)
Basophils Absolute: 0 10*3/uL (ref 0.0–0.1)
Basophils Relative: 0 %
Eosinophils Absolute: 0 10*3/uL (ref 0.0–0.5)
Eosinophils Relative: 0 %
HCT: 40 % (ref 39.0–52.0)
Hemoglobin: 13.2 g/dL (ref 13.0–17.0)
Immature Granulocytes: 2 %
Lymphocytes Relative: 16 %
Lymphs Abs: 1.1 10*3/uL (ref 0.7–4.0)
MCH: 31.1 pg (ref 26.0–34.0)
MCHC: 33 g/dL (ref 30.0–36.0)
MCV: 94.3 fL (ref 80.0–100.0)
Monocytes Absolute: 1.4 10*3/uL — ABNORMAL HIGH (ref 0.1–1.0)
Monocytes Relative: 21 %
Neutro Abs: 4 10*3/uL (ref 1.7–7.7)
Neutrophils Relative %: 61 %
Platelets: 148 10*3/uL — ABNORMAL LOW (ref 150–400)
RBC: 4.24 MIL/uL (ref 4.22–5.81)
RDW: 14.4 % (ref 11.5–15.5)
WBC: 6.5 10*3/uL (ref 4.0–10.5)
nRBC: 0 % (ref 0.0–0.2)

## 2019-07-05 LAB — GLUCOSE, CAPILLARY
Glucose-Capillary: 73 mg/dL (ref 70–99)
Glucose-Capillary: 74 mg/dL (ref 70–99)
Glucose-Capillary: 80 mg/dL (ref 70–99)
Glucose-Capillary: 82 mg/dL (ref 70–99)
Glucose-Capillary: 87 mg/dL (ref 70–99)

## 2019-07-05 LAB — BRAIN NATRIURETIC PEPTIDE: B Natriuretic Peptide: 222 pg/mL — ABNORMAL HIGH (ref 0.0–100.0)

## 2019-07-05 LAB — BASIC METABOLIC PANEL
Anion gap: 12 (ref 5–15)
BUN: 24 mg/dL — ABNORMAL HIGH (ref 8–23)
CO2: 16 mmol/L — ABNORMAL LOW (ref 22–32)
Calcium: 7.9 mg/dL — ABNORMAL LOW (ref 8.9–10.3)
Chloride: 106 mmol/L (ref 98–111)
Creatinine, Ser: 0.79 mg/dL (ref 0.61–1.24)
GFR calc Af Amer: >60 mL/min (ref >60)
GFR calc non Af Amer: >60 mL/min (ref >60)
Glucose, Bld: 79 mg/dL (ref 70–99)
Potassium: 3.6 mmol/L (ref 3.5–5.1)
Sodium: 134 mmol/L — ABNORMAL LOW (ref 135–145)

## 2019-07-05 LAB — MAGNESIUM: Magnesium: 1.9 mg/dL (ref 1.7–2.4)

## 2019-07-05 MED ORDER — POTASSIUM CHLORIDE CRYS ER 20 MEQ PO TBCR
40.0000 meq | EXTENDED_RELEASE_TABLET | Freq: Once | ORAL | Status: AC
  Administered 2019-07-05: 40 meq via ORAL
  Filled 2019-07-05: qty 2

## 2019-07-05 MED ORDER — MAGNESIUM SULFATE 2 GM/50ML IV SOLN
2.0000 g | Freq: Once | INTRAVENOUS | Status: AC
  Administered 2019-07-05: 2 g via INTRAVENOUS
  Filled 2019-07-05: qty 50

## 2019-07-05 MED ORDER — GADOBUTROL 1 MMOL/ML IV SOLN
10.0000 mL | Freq: Once | INTRAVENOUS | Status: AC | PRN
  Administered 2019-07-05: 10 mL via INTRAVENOUS

## 2019-07-05 MED ORDER — LIDOCAINE 5 % EX OINT
TOPICAL_OINTMENT | Freq: Three times a day (TID) | CUTANEOUS | Status: DC | PRN
  Filled 2019-07-05: qty 35.44

## 2019-07-05 NOTE — Progress Notes (Signed)
Chart review note Sign out received to check MRI results.  MRI remains pending. Spoke to RN, who will call radiology for scheduling update on the MRI brain. Will update recs if MRI Brain is positive. No new recs if negative for acute pathology.  -- Amie Portland, MD [facility] Pager: [phone] If 7pm to 7am, please call on call as listed on AMION.

## 2019-07-05 NOTE — Progress Notes (Signed)
PROGRESS NOTE    TASMAN ZAPATA  WSF:[id number] DOB: 1941-10-13 DOA: 07/04/2019 PCP: [name], MD    Brief Narrative:  Patient is a 78 year old gentleman history of mild congestive heart failure, diabetes, A. fib on chronic anticoagulation, post open repair of abdominal aortic aneurysm in 2009, incisional hernia repair presenting to the ED with a 3-day history of abdominal distention, nausea and vomiting.  Patient also noted to be orthostatic with presyncopal episodes prior to admission.  Abdominal films done consistent with distal small bowel obstruction.  General surgery consulted.  Small bowel protocol ordered.  NG tube placed.  Anticoagulation on hold.  CT abdomen and pelvis ordered consistent with partial small bowel obstruction.  MRI head pending.  Patient placed on IV fluids, supportive care.   Assessment & Plan:   Principal Problem:   Small bowel obstruction (HCC) Active Problems:   DM (diabetes mellitus), type 2, uncontrolled w/neurologic complication (HCC)   Paroxysmal atrial fibrillation with RVR (HCC)   Chronic combined systolic and diastolic heart failure (HCC)   Near syncope   Postural dizziness with presyncope   Postural dizziness with near syncope   Abdominal distention, non-gaseous   Change in mental status  1 partial small bowel obstruction Likely secondary to adhesions.  Small bowel protocol consistent with small bowel obstruction.  CT abdomen and pelvis consistent with partial small bowel obstruction.  Patient with no flatus or bowel movement.  NG tube in place.  Continue IV fluids.  General surgery following and appreciate input and recommendations.  2.  Near syncope likely secondary to orthostasis Patient at present near syncopal episode likely secondary to volume depletion.  Patient noted to be orthostatic on admission.  Patient with no signs or symptoms of infection.  Improving with hydration.  Continue IV fluids.  Repeat orthostatics.  Follow.  3.  Paroxysmal  atrial fibrillation Currently rate controlled.  Continue amiodarone.  Patient on IV Lopressor while n.p.o.  Hold anticoagulation for now secondary to problem #1 just in case patient may need surgery.  Follow.  4.  Acute metabolic encephalopathy Per admitting physician was noted that patient's wife and stated that patient had some increased confusion, delusion, hallucination and mild disorientation.  Patient alert oriented to self place and time.  Improved.  CT head negative.  MRI brain pending.  IV fluids.  Supportive care.  5.  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  CBG of 74 this morning.  Patient currently n.p.o.  Continue to hold oral hypoglycemic agents.  Continue Lyrica.  Lidocaine ointment TID prn.  Sliding scale insulin.  6.  Hypertension Blood pressure borderline.  Patient currently n.p.o.  Antihypertensive medications on hold.  Gentle hydration.  Follow.  7.  Chronic combined systolic and diastolic CHF/coronary artery disease status post LAD stenting Stable.  ACE inhibitor on hold due to borderline blood pressure.  Patient on IV Lopressor.  Outpatient follow-up with cardiology.  Monitor closely for volume overload.     DVT prophylaxis: SCDs Code Status: Full Family Communication: Updated patient and wife at bedside. Disposition Plan: Home once small bowel obstruction is resolved when okay with general surgery.   Consultants:   General surgery: Dr. Harlow Asa 07/04/2019  Procedures:   MRI head pending  CT abdomen and pelvis 07/04/2019  CT head 07/04/2019  Small bowel protocol  Abdominal films 07/04/2019, 07/05/2019  Chest x-ray 07/05/2019  Antimicrobials:   None   Subjective: Patient sitting up in the chair.  Denies any nausea or emesis.  Feeling  better than he did on admission.  No bowel movement.  No flatus.  NG tube with bilious material.  Patient complaining of neuropathy.  Objective: Vitals:   07/05/19 1002 07/05/19 1008 07/05/19  1013 07/05/19 1406  BP: (!) 118/53 105/77 103/71 (!) 114/59  Pulse: 83 84 87 88  Resp:    18  Temp:    (!) 97.4 F (36.3 C)  TempSrc:    Oral  SpO2:    97%  Weight:      Height:        Intake/Output Summary (Last 24 hours) at 07/05/2019 1557 Last data filed at 07/05/2019 1200 Gross per 24 hour  Intake 3190.83 ml  Output 3300 ml  Net -109.17 ml   Filed Weights   07/04/19 0508 07/04/19 1822  Weight: 88.5 kg 94 kg    Examination:  General exam: NAD.  NG tube in place. Respiratory system: CTA B.  No wheezes, no crackles, no rhonchi.  Normal respiratory effort.   Cardiovascular system: Regular rate and rhythm no murmurs rubs or gallops.  No JVD.  No lower extremity edema.  Gastrointestinal system: Abdomen is soft, nontender, distended, hypoactive bowel sounds.  No rebound.  No guarding.   Central nervous system: Alert and oriented. No focal neurological deficits. Extremities: Symmetric 5 x 5 power. Skin: No rashes, lesions or ulcers Psychiatry: Judgement and insight appear normal. Mood & affect appropriate.     Data Reviewed: I have personally reviewed following labs and imaging studies  CBC: Recent Labs  Lab 07/04/19 0218 07/05/19 0655  WBC 11.0* 6.5  NEUTROABS 7.2 4.0  HGB 14.8 13.2  HCT 42.5 40.0  MCV 90.6 94.3  PLT 175 235*   Basic Metabolic Panel: Recent Labs  Lab 07/04/19 0218 07/04/19 1017 07/05/19 0655  NA 131* 136 134*  K 4.2 4.2 3.6  CL 95* 98 106  CO2 18* 23 16*  GLUCOSE 206* 151* 79  BUN 50* 46* 24*  CREATININE 1.21 1.11 0.79  CALCIUM 8.7* 8.6* 7.9*  MG  --  2.2 1.9   GFR: Estimated Creatinine Clearance: 83.2 mL/min (by C-G formula based on SCr of 0.79 mg/dL). Liver Function Tests: Recent Labs  Lab 07/04/19 0218  AST 20  ALT 23  ALKPHOS 58  BILITOT 1.2  PROT 6.9  ALBUMIN 3.6   No results for input(s): LIPASE, AMYLASE in the last 168 hours. No results for input(s): AMMONIA in the last 168 hours. Coagulation Profile: Recent Labs   Lab 07/04/19 0257  INR 3.9*   Cardiac Enzymes: No results for input(s): CKTOTAL, CKMB, CKMBINDEX, TROPONINI in the last 168 hours. BNP (last 3 results) No results for input(s): PROBNP in the last 8760 hours. HbA1C: Recent Labs    07/04/19 1017  HGBA1C 7.3*   CBG: Recent Labs  Lab 07/04/19 1923 07/04/19 2315 07/05/19 0455 07/05/19 0743 07/05/19 1201  GLUCAP 109* 92 73 74 80   Lipid Profile: No results for input(s): CHOL, HDL, LDLCALC, TRIG, CHOLHDL, LDLDIRECT in the last 72 hours. Thyroid Function Tests: No results for input(s): TSH, T4TOTAL, FREET4, T3FREE, THYROIDAB in the last 72 hours. Anemia Panel: No results for input(s): VITAMINB12, FOLATE, FERRITIN, TIBC, IRON, RETICCTPCT in the last 72 hours. Sepsis Labs: No results for input(s): PROCALCITON, LATICACIDVEN in the last 168 hours.  Recent Results (from the past 240 hour(s))  SARS CORONAVIRUS 2 (TAT 6-24 HRS) Nasopharyngeal Nasopharyngeal Swab     Status: None   Collection Time: 07/04/19  5:14 AM   Specimen: Nasopharyngeal Swab  Result Value Ref Range Status   SARS Coronavirus 2 NEGATIVE NEGATIVE Final    Comment: (NOTE) SARS-CoV-2 target nucleic acids are NOT DETECTED. The SARS-CoV-2 RNA is generally detectable in upper and lower respiratory specimens during the acute phase of infection. Negative results do not preclude SARS-CoV-2 infection, do not rule out co-infections with other pathogens, and should not be used as the sole basis for treatment or other patient management decisions. Negative results must be combined with clinical observations, patient history, and epidemiological information. The expected result is Negative. Fact Sheet for Patients: SugarRoll.be Fact Sheet for Healthcare Providers: https://www.woods-mathews.com/ This test is not yet approved or cleared by the [place] FDA and  has been authorized for detection and/or diagnosis of SARS-CoV-2  by FDA under an Emergency Use Authorization (EUA). This EUA will remain  in effect (meaning this test can be used) for the duration of the COVID-19 declaration under Section 56 4(b)(1) of the Act, 21 U.S.C. section 360bbb-3(b)(1), unless the authorization is terminated or revoked sooner. Performed at [facility], [place] [address] [place], [place] [address]          Radiology Studies: Dg Chest 2 View  Result Date: 07/05/2019 CLINICAL DATA:  78 year old male admitted to the medical service with 3-day history of abdominal discomfort, abdominal distention, nausea and vomiting. Eval for CHF. Patient has a complex past medical history. EXAM: CHEST - 2 VIEW COMPARISON:  04/28/2018 and earlier FINDINGS: Heart size is normal. There is atherosclerotic calcification of the thoracic aorta. Trace bilateral pleural effusions. No focal consolidations. Opacity at the RIGHT lung apex overlies the RIGHT first rib. A separate indistinct opacity is identified in the [place] lobe. IMPRESSION: 1. Question of RIGHT upper lobe nodules. 2. Further evaluation with chest CT is recommended. 3. Trace bilateral pleural effusions. Electronically Signed   By: Nolon Nations M.D.   On: 07/05/2019 09:51   Dg Abdomen 1 View  Result Date: 07/04/2019 CLINICAL DATA:  Encounter for NG tube replacement. EXAM: ABDOMEN - 1 VIEW COMPARISON:  Abdominal radiograph 07/04/2019 at 7:09 a.m. FINDINGS: Interval placement of a nasogastric tube with side port projecting beyond the GE junction over the stomach. There are several dilated air-filled loops of small bowel in the mid abdomen raising concern for obstruction. Surgical clips noted in the right upper quadrant. Lumbar spine hardware intact. IMPRESSION: 1. Nasogastric tube side port projects beyond the GE junction over the stomach. 2. Redemonstrated dilated air-filled loops of small bowel in the mid abdomen raising concern for obstruction. Electronically Signed   By:  Audie Pinto M.D.   On: 07/04/2019 17:55   Dg Abd 1 View  Result Date: 07/04/2019 CLINICAL DATA:  Abdominal distention for 3 days. Anorexia. EXAM: ABDOMEN - 1 VIEW COMPARISON:  04/26/2016 FINDINGS: Moderately dilated small bowel loops are seen in the lower abdomen, with paucity of colonic gas. These findings are suspicious for a distal small bowel obstruction. Right upper quadrant surgical clips from prior cholecystectomy, and lumbar spine fusion hardware noted. Probable tiny adjacent less than 5 mm left renal calculi again noted. No definite radiopaque calculi seen along the course of the ureters or bladder. IMPRESSION: Findings suspicious for distal small bowel obstruction. Probable tiny nonobstructing left renal calculi. Electronically Signed   By: Marlaine Hind M.D.   On: 07/04/2019 07:44   Ct Head Wo Contrast  Result Date: 07/04/2019 CLINICAL DATA:  Initial evaluation for acute ataxia, stroke suspected. EXAM: CT HEAD WITHOUT CONTRAST TECHNIQUE: Contiguous axial images were  obtained from the base of the skull through the vertex without intravenous contrast. COMPARISON:  Prior CT from 06/02/2018. FINDINGS: Brain: Generalized age-related cerebral atrophy with mild chronic small vessel ischemic disease. No acute intracranial hemorrhage. No acute large vessel territory infarct. No mass lesion, midline shift or mass effect. No hydrocephalus. No extra-axial fluid collection. Vascular: No hyperdense vessel. Scattered vascular calcifications noted within the carotid siphons. Skull: Scalp soft tissues and calvarium within normal limits. Sinuses/Orbits: Globes and orbital soft tissues demonstrate no acute finding. Trace layering opacity noted within the right sphenoid sinus. Paranasal sinuses are otherwise clear. No mastoid effusion. Other: Noted. IMPRESSION: 1. No acute intracranial abnormality. 2. Generalized age-related cerebral atrophy with mild chronic small vessel ischemic disease. Electronically Signed    By: Jeannine Boga M.D.   On: 07/04/2019 04:05   Ct Abdomen Pelvis W Contrast  Result Date: 07/04/2019 CLINICAL DATA:  78 year old male with history of abdominal distention, nausea vomiting. Concern for bowel obstruction. EXAM: CT ABDOMEN AND PELVIS WITH CONTRAST TECHNIQUE: Multidetector CT imaging of the abdomen and pelvis was performed using the standard protocol following bolus administration of intravenous contrast. CONTRAST:  76mL OMNIPAQUE IOHEXOL 300 MG/ML SOLN, 158mL OMNIPAQUE IOHEXOL 300 MG/ML SOLN COMPARISON:  CT of the abdomen pelvis dated 04/29/2018 FINDINGS: Lower chest: Partially visualized small bilateral pleural effusions and associated partial compressive atelectasis of the lower lobes. There is mild cardiomegaly. Coronary vascular calcifications noted. No intra-abdominal free air or free fluid. Hepatobiliary: The liver is unremarkable. Mild intrahepatic biliary ductal dilatation. Cholecystectomy. No retained calcified stone noted in the central CBD. Pancreas: Unremarkable. No pancreatic ductal dilatation or surrounding inflammatory changes. Spleen: Normal in size without focal abnormality. Adrenals/Urinary Tract: The adrenal glands are unremarkable. Mild left renal atrophy with cortical scarring primarily involving the inferior pole. Two adjacent left renal inferior pole calculi each measuring approximately 4 mm. There is no hydronephrosis on the left. There is mild right hydronephrosis. No obstructing calculi identified. Multiple right renal cysts noted similar to prior CT. There is symmetric enhancement and excretion of contrast by both kidneys. The urinary bladder is distended. Stomach/Bowel: An enteric tube is noted with tip in the proximal stomach. Mildly dilated loops of contrast filled proximal small bowel measure up to 3.8 cm in caliber. There is apparent mild thickening of a short segment of small bowel the left lower abdomen which may be related to underdistention. Segmental  enteritis is less likely. Clinical correlation is recommended. The distal small bowel and terminal ileum demonstrate a normal caliber. There is an area of tethering of the adjacent bowel loops in the lower abdomen (series 2, image 51 and 45) which may represent areas of adhesion. No discrete transition identified. The appendix is normal. Vascular/Lymphatic: Advanced aortoiliac atherosclerotic disease. 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 No significant change since the prior CT. The IVC is unremarkable. No portal venous gas. There is no adenopathy. Reproductive: The prostate and seminal vesicles are grossly unremarkable. No pelvic mass. Other: Midline vertical anterior pelvic wall incisional scar. Musculoskeletal: Degenerative changes of the spine. Lower lumbar posterior fusion. No acute osseous pathology. IMPRESSION: 1. Mildly dilated proximal small bowel loops may represent ileus. An early or low grade obstruction is less likely but not excluded. Clinical correlation and follow-up with serial KUB recommended to document passage of oral contrast into the colon. 2. Small bilateral pleural effusions with associated partial compressive atelectasis of the lower lobes. 3. No significant interval change in the 3.4 cm infrarenal abdominal aortic  aneurysm status post prior repair. 4. Aortic Atherosclerosis (ICD10-I70.0). Electronically Signed   By: Anner Crete M.D.   On: 07/04/2019 23:57   Dg Abd Portable 1v-small Bowel Obstruction Protocol-initial, 8 Hr Delay  Result Date: 07/05/2019 CLINICAL DATA:  8 Hour Delay image for Small Bowel protocol 78 year old male admitted to the medical service with 3-day history of abdominal discomfort, abdominal distention, nausea and vomiting. EXAM: PORTABLE ABDOMEN - 1 VIEW COMPARISON:  CT of the abdomen and pelvis on 07/04/2019 FINDINGS: There is persistent dilatation of small bowel loops, measuring up to 3.9 centimeters in the central abdomen.  Contrast is identified within nondilated loops of colon following recent CT exam. Nasogastric tube overlies the stomach. Contrast is identified within the urinary bladder following CT exam. IMPRESSION: Similar small bowel dilatation. Presence of contrast in the colon excludes complete obstruction. Electronically Signed   By: Nolon Nations M.D.   On: 07/05/2019 09:46        Scheduled Meds: . amiodarone  200 mg Oral BID  . amitriptyline  25 mg Oral QHS  . insulin aspart  0-9 Units Subcutaneous Q4H  . metoprolol tartrate  5 mg Intravenous Q8H  . pantoprazole (PROTONIX) IV  40 mg Intravenous Q24H  . pregabalin  300 mg Oral Daily  . thiamine injection  100 mg Intravenous Daily   Continuous Infusions: . sodium chloride 125 mL/hr at 07/05/19 0339     LOS: 0 days    Time spent: 35 minutes    Irine Seal, MD [facility]  If 7PM-7AM, please contact night-coverage [email or web address] 07/05/2019, 3:57 PM

## 2019-07-05 NOTE — Progress Notes (Signed)
MRI reviewed. No acute changes Recommendations per Dr. Cecil Cobbs last note. No new neurological recommendations at this time.  Neurology service will sign off and will be available as needed.  -- Amie Portland, MD [facility] Pager: [phone] If 7pm to 7am, please call on call as listed on AMION.

## 2019-07-05 NOTE — Progress Notes (Signed)
Assessment & Plan: HD#2 - Small bowel obstruction  CT results reviewed - likely partial SBO due to adhesions  NPO, IV hydration, NG decompression  Repeat AXR this AM per protocol to evaluate if contrast has reached colon  Encouraged OOB, ambulation  Will follow with you.        Kristopher Gemma, MD       [facility]       Office: [phone]   Chief Complaint: Small bowel obstruction  Subjective: Patient in bed, comfortable, no complaints.  NG with thin bilious.  No flatus or BM overnight.  Objective: Vital signs in last 24 hours: Temp:  [97.5 F (36.4 C)-98.3 F (36.8 C)] 97.8 F (36.6 C) (09/07 0459) Pulse Rate:  [40-96] 80 (09/07 0614) Resp:  [12-21] 17 (09/07 0459) BP: (103-129)/(52-95) 108/55 (09/07 0614) SpO2:  [91 %-100 %] 98 % (09/07 0614) Weight:  [94 kg] 94 kg (09/06 1822) Last BM Date: 07/04/19  Intake/Output from previous day: 09/06 0701 - 09/07 0700 In: 3316.1 [I.V.:2026.1; NG/GT:1290] Out: 2600 [Urine:1450; Emesis/NG output:1150] Intake/Output this shift: No intake/output data recorded.  Physical Exam: HEENT - sclerae clear, mucous membranes moist Neck - soft Chest - clear bilaterally Cor - RRR Abdomen - soft, mild distension; quiet to auscultation; non-tender Ext - no edema, non-tender Neuro - alert & oriented, no focal deficits  Lab Results:  Recent Labs    07/04/19 0218 07/05/19 0655  WBC 11.0* 6.5  HGB 14.8 13.2  HCT 42.5 40.0  PLT 175 148*   BMET Recent Labs    07/04/19 1017 07/05/19 0655  NA 136 134*  K 4.2 3.6  CL 98 106  CO2 23 16*  GLUCOSE 151* 79  BUN 46* 24*  CREATININE 1.11 0.79  CALCIUM 8.6* 7.9*   PT/INR Recent Labs    07/04/19 0257  LABPROT 37.8*  INR 3.9*   Comprehensive Metabolic Panel:    Component Value Date/Time   NA 134 (L) 07/05/2019 0655   NA 136 07/04/2019 1017   NA 140 06/02/2018 1001   K 3.6 07/05/2019 0655   K 4.2 07/04/2019 1017   CL 106 07/05/2019 0655   CL 98  07/04/2019 1017   CO2 16 (L) 07/05/2019 0655   CO2 23 07/04/2019 1017   BUN 24 (H) 07/05/2019 0655   BUN 46 (H) 07/04/2019 1017   BUN 24 06/02/2018 1001   CREATININE 0.79 07/05/2019 0655   CREATININE 1.11 07/04/2019 1017   CREATININE 1.03 01/09/2016 1129   CREATININE 1.17 12/12/2015 1026   GLUCOSE 79 07/05/2019 0655   GLUCOSE 151 (H) 07/04/2019 1017   CALCIUM 7.9 (L) 07/05/2019 0655   CALCIUM 8.6 (L) 07/04/2019 1017   AST 20 07/04/2019 0218   AST 25 06/02/2018 1001   ALT 23 07/04/2019 0218   ALT 36 06/02/2018 1001   ALKPHOS 58 07/04/2019 0218   ALKPHOS 66 06/02/2018 1001   BILITOT 1.2 07/04/2019 0218   BILITOT 0.5 06/02/2018 1001   BILITOT 1.0 05/08/2018 1550   PROT 6.9 07/04/2019 0218   PROT 6.2 06/02/2018 1001   PROT 6.7 05/08/2018 1550   ALBUMIN 3.6 07/04/2019 0218   ALBUMIN 3.9 06/02/2018 1001   ALBUMIN 3.9 05/08/2018 1550    Studies/Results: Dg Abdomen 1 View  Result Date: 07/04/2019 CLINICAL DATA:  Encounter for NG tube replacement. EXAM: ABDOMEN - 1 VIEW COMPARISON:  Abdominal radiograph 07/04/2019 at 7:09 a.m. FINDINGS: Interval placement of a nasogastric tube with side port projecting beyond the GE  junction over the stomach. There are several dilated air-filled loops of small bowel in the mid abdomen raising concern for obstruction. Surgical clips noted in the right upper quadrant. Lumbar spine hardware intact. IMPRESSION: 1. Nasogastric tube side port projects beyond the GE junction over the stomach. 2. Redemonstrated dilated air-filled loops of small bowel in the mid abdomen raising concern for obstruction. Electronically Signed   By: Audie Pinto M.D.   On: 07/04/2019 17:55   Dg Abd 1 View  Result Date: 07/04/2019 CLINICAL DATA:  Abdominal distention for 3 days. Anorexia. EXAM: ABDOMEN - 1 VIEW COMPARISON:  04/26/2016 FINDINGS: Moderately dilated small bowel loops are seen in the lower abdomen, with paucity of colonic gas. These findings are suspicious for a  distal small bowel obstruction. Right upper quadrant surgical clips from prior cholecystectomy, and lumbar spine fusion hardware noted. Probable tiny adjacent less than 5 mm left renal calculi again noted. No definite radiopaque calculi seen along the course of the ureters or bladder. IMPRESSION: Findings suspicious for distal small bowel obstruction. Probable tiny nonobstructing left renal calculi. Electronically Signed   By: Marlaine Hind M.D.   On: 07/04/2019 07:44   Ct Head Wo Contrast  Result Date: 07/04/2019 CLINICAL DATA:  Initial evaluation for acute ataxia, stroke suspected. EXAM: CT HEAD WITHOUT CONTRAST TECHNIQUE: Contiguous axial images were obtained from the base of the skull through the vertex without intravenous contrast. COMPARISON:  Prior CT from 06/02/2018. FINDINGS: Brain: Generalized age-related cerebral atrophy with mild chronic small vessel ischemic disease. No acute intracranial hemorrhage. No acute large vessel territory infarct. No mass lesion, midline shift or mass effect. No hydrocephalus. No extra-axial fluid collection. Vascular: No hyperdense vessel. Scattered vascular calcifications noted within the carotid siphons. Skull: Scalp soft tissues and calvarium within normal limits. Sinuses/Orbits: Globes and orbital soft tissues demonstrate no acute finding. Trace layering opacity noted within the right sphenoid sinus. Paranasal sinuses are otherwise clear. No mastoid effusion. Other: Noted. IMPRESSION: 1. No acute intracranial abnormality. 2. Generalized age-related cerebral atrophy with mild chronic small vessel ischemic disease. Electronically Signed   By: Jeannine Boga M.D.   On: 07/04/2019 04:05   Ct Abdomen Pelvis W Contrast  Result Date: 07/04/2019 CLINICAL DATA:  78 year old male with history of abdominal distention, nausea vomiting. Concern for bowel obstruction. EXAM: CT ABDOMEN AND PELVIS WITH CONTRAST TECHNIQUE: Multidetector CT imaging of the abdomen and pelvis was  performed using the standard protocol following bolus administration of intravenous contrast. CONTRAST:  44mL OMNIPAQUE IOHEXOL 300 MG/ML SOLN, 124mL OMNIPAQUE IOHEXOL 300 MG/ML SOLN COMPARISON:  CT of the abdomen pelvis dated 04/29/2018 FINDINGS: Lower chest: Partially visualized small bilateral pleural effusions and associated partial compressive atelectasis of the lower lobes. There is mild cardiomegaly. Coronary vascular calcifications noted. No intra-abdominal free air or free fluid. Hepatobiliary: The liver is unremarkable. Mild intrahepatic biliary ductal dilatation. Cholecystectomy. No retained calcified stone noted in the central CBD. Pancreas: Unremarkable. No pancreatic ductal dilatation or surrounding inflammatory changes. Spleen: Normal in size without focal abnormality. Adrenals/Urinary Tract: The adrenal glands are unremarkable. Mild left renal atrophy with cortical scarring primarily involving the inferior pole. Two adjacent left renal inferior pole calculi each measuring approximately 4 mm. There is no hydronephrosis on the left. There is mild right hydronephrosis. No obstructing calculi identified. Multiple right renal cysts noted similar to prior CT. There is symmetric enhancement and excretion of contrast by both kidneys. The urinary bladder is distended. Stomach/Bowel: An enteric tube is noted with tip in the proximal stomach.  Mildly dilated loops of contrast filled proximal small bowel measure up to 3.8 cm in caliber. There is apparent mild thickening of a short segment of small bowel the left lower abdomen which may be related to underdistention. Segmental enteritis is less likely. Clinical correlation is recommended. The distal small bowel and terminal ileum demonstrate a normal caliber. There is an area of tethering of the adjacent bowel loops in the lower abdomen (series 2, image 51 and 45) which may represent areas of adhesion. No discrete transition identified. The appendix is normal.  Vascular/Lymphatic: Advanced aortoiliac atherosclerotic disease. 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 No significant change since the prior CT. The IVC is unremarkable. No portal venous gas. There is no adenopathy. Reproductive: The prostate and seminal vesicles are grossly unremarkable. No pelvic mass. Other: Midline vertical anterior pelvic wall incisional scar. Musculoskeletal: Degenerative changes of the spine. Lower lumbar posterior fusion. No acute osseous pathology. IMPRESSION: 1. Mildly dilated proximal small bowel loops may represent ileus. An early or low grade obstruction is less likely but not excluded. Clinical correlation and follow-up with serial KUB recommended to document passage of oral contrast into the colon. 2. Small bilateral pleural effusions with associated partial compressive atelectasis of the lower lobes. 3. No significant interval change in the 3.4 cm infrarenal abdominal aortic aneurysm status post prior repair. 4. Aortic Atherosclerosis (ICD10-I70.0). Electronically Signed   By: Anner Crete M.D.   On: 07/04/2019 23:57      [name] 07/05/2019  Patient ID: [name], male   DOB: Feb 24, 1941, 78 y.o.   MRN: [id number]

## 2019-07-06 ENCOUNTER — Inpatient Hospital Stay (HOSPITAL_COMMUNITY): Payer: Medicare Other

## 2019-07-06 LAB — GLUCOSE, CAPILLARY
Glucose-Capillary: 83 mg/dL (ref 70–99)
Glucose-Capillary: 78 mg/dL (ref 70–99)
Glucose-Capillary: 95 mg/dL (ref 70–99)
Glucose-Capillary: 132 mg/dL — ABNORMAL HIGH (ref 70–99)
Glucose-Capillary: 165 mg/dL — ABNORMAL HIGH (ref 70–99)

## 2019-07-06 LAB — CBC WITH DIFFERENTIAL/PLATELET
Abs Immature Granulocytes: 0.42 10*3/uL — ABNORMAL HIGH (ref 0.00–0.07)
Basophils Absolute: 0.1 10*3/uL (ref 0.0–0.1)
Basophils Relative: 1 %
Eosinophils Absolute: 0 10*3/uL (ref 0.0–0.5)
Eosinophils Relative: 0 %
HCT: 43.1 % (ref 39.0–52.0)
Hemoglobin: 13.9 g/dL (ref 13.0–17.0)
Immature Granulocytes: 6 %
Lymphocytes Relative: 18 %
Lymphs Abs: 1.2 10*3/uL (ref 0.7–4.0)
MCH: 30.7 pg (ref 26.0–34.0)
MCHC: 32.3 g/dL (ref 30.0–36.0)
MCV: 95.1 fL (ref 80.0–100.0)
Monocytes Absolute: 1.1 10*3/uL — ABNORMAL HIGH (ref 0.1–1.0)
Monocytes Relative: 17 %
Neutro Abs: 3.8 10*3/uL (ref 1.7–7.7)
Neutrophils Relative %: 58 %
Platelets: 157 10*3/uL (ref 150–400)
RBC: 4.53 MIL/uL (ref 4.22–5.81)
RDW: 14.5 % (ref 11.5–15.5)
WBC: 6.7 10*3/uL (ref 4.0–10.5)
nRBC: 0 % (ref 0.0–0.2)

## 2019-07-06 LAB — BASIC METABOLIC PANEL
Anion gap: 11 (ref 5–15)
BUN: 14 mg/dL (ref 8–23)
CO2: 14 mmol/L — ABNORMAL LOW (ref 22–32)
Calcium: 7.9 mg/dL — ABNORMAL LOW (ref 8.9–10.3)
Chloride: 110 mmol/L (ref 98–111)
Creatinine, Ser: 0.84 mg/dL (ref 0.61–1.24)
GFR calc Af Amer: >60 mL/min (ref >60)
GFR calc non Af Amer: >60 mL/min (ref >60)
Glucose, Bld: 93 mg/dL (ref 70–99)
Potassium: 3.8 mmol/L (ref 3.5–5.1)
Sodium: 135 mmol/L (ref 135–145)

## 2019-07-06 LAB — MAGNESIUM: Magnesium: 2.1 mg/dL (ref 1.7–2.4)

## 2019-07-06 MED ORDER — SODIUM BICARBONATE-DEXTROSE 150-5 MEQ/L-% IV SOLN
150.0000 meq | INTRAVENOUS | Status: DC
  Administered 2019-07-06 – 2019-07-07 (×3): 150 meq via INTRAVENOUS
  Filled 2019-07-06 (×4): qty 1000

## 2019-07-06 NOTE — Progress Notes (Signed)
PROGRESS NOTE    [name]  VOZ:[id number] DOB: 11-02-1940 DOA: 07/04/2019 PCP: [name], MD    Brief Narrative:  Patient is a 78 year old gentleman history of mild congestive heart failure, diabetes, A. fib on chronic anticoagulation, post open repair of abdominal aortic aneurysm in 2009, incisional hernia repair presenting to the ED with a 3-day history of abdominal distention, nausea and vomiting.  Patient also noted to be orthostatic with presyncopal episodes prior to admission.  Abdominal films done consistent with distal small bowel obstruction.  General surgery consulted.  Small bowel protocol ordered.  NG tube placed.  Anticoagulation on hold.  CT abdomen and pelvis ordered consistent with partial small bowel obstruction.  MRI head pending.  Patient placed on IV fluids, supportive care.   Assessment & Plan:   Principal Problem:   Small bowel obstruction (HCC) Active Problems:   DM (diabetes mellitus), type 2, uncontrolled w/neurologic complication (HCC)   Paroxysmal atrial fibrillation with RVR (HCC)   Chronic combined systolic and diastolic heart failure (HCC)   Near syncope   Postural dizziness with presyncope   Postural dizziness with near syncope   Abdominal distention, non-gaseous   Change in mental status   Acute metabolic encephalopathy   Orthostatic dizziness  1 partial small bowel obstruction Likely secondary to adhesions.  Small bowel protocol consistent with small bowel obstruction.  CT abdomen and pelvis consistent with partial small bowel obstruction.  Patient with no flatus or bowel movement.  NG tube in place.  Abdominal films with persistent dilated loops of bowels.  NG tube output 1025 cc over the past 24 hours.  Continue IV fluids.  General surgery following and appreciate input and recommendations.  2.  Near syncope likely secondary to orthostasis Patient on admission, near syncopal episode likely secondary to volume depletion.  Patient noted to be  orthostatic on admission.  Patient with no signs or symptoms of infection.  Improving with hydration.  Continue IV fluids.  Repeat orthostatics with improvement.  Follow.  3.  Paroxysmal atrial fibrillation Currently rate controlled.  Continue amiodarone.  Patient on IV Lopressor while n.p.o.  Hold anticoagulation for now secondary to problem #1 just in case patient may need surgery.  Follow.  4.  Acute metabolic encephalopathy Per admitting physician was noted that patient's wife and stated that patient had some increased confusion, delusion, hallucination and mild disorientation.  Patient alert oriented to self place and time.  Improved.  CT head negative.  MRI brain negative.  IV fluids.  Supportive care.  5.  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  CBG of 78 this morning.  Patient currently n.p.o.  Continue to hold oral hypoglycemic agents.  Continue Lyrica.  Lidocaine ointment TID prn.  Sliding scale insulin.  6.  Hypertension Blood pressure borderline.  Patient currently n.p.o due to small bowel obstruction.[name]  Antihypertensive medications on hold.  Continue IV Lopressor.  Gentle hydration.  Follow.  7.  Chronic combined systolic and diastolic CHF/coronary artery disease status post LAD stenting Stable.  ACE inhibitor on hold due to borderline blood pressure.  Patient on IV Lopressor.  Outpatient follow-up with cardiology.  Monitor closely for volume overload.     DVT prophylaxis: SCDs Code Status: Full Family Communication: Updated patient and wife at bedside. Disposition Plan: Home once small bowel obstruction is resolved when okay with general surgery.   Consultants:   General surgery: Dr. Harlow Asa 07/04/2019  Procedures:   MRI head 07/05/2019  CT abdomen and pelvis  07/04/2019  CT head 07/04/2019  Small bowel protocol  Abdominal films 07/04/2019, 07/05/2019  Chest x-ray 07/05/2019  Antimicrobials:   None   Subjective: Patient patient laying  in bed.  Denies any emesis.  Feels abdominal pain improving.  Weakness improving.  No bowel movements.  No flatus.  NG tube with bilious material.  Feels he is slowly improving.   Objective: Vitals:   07/05/19 1406 07/05/19 2025 07/06/19 0532 07/06/19 1336  BP: (!) 114/59 95/71 128/65 121/71  Pulse: 88 79 84 84  Resp: 18 20 20 20   Temp: (!) 97.4 F (36.3 C) (!) 97.4 F (36.3 C) 97.7 F (36.5 C) (!) 97.5 F (36.4 C)  TempSrc: Oral Oral Oral   SpO2: 97% 95% 98% 100%  Weight:      Height:        Intake/Output Summary (Last 24 hours) at 07/06/2019 1343 Last data filed at 07/06/2019 0648 Gross per 24 hour  Intake -  Output 1175 ml  Net -1175 ml   Filed Weights   07/04/19 0508 07/04/19 1822  Weight: 88.5 kg 94 kg    Examination:  General exam: NAD.  NG tube in place. Respiratory system: Lungs are to auscultation bilaterally.  No wheezes, no crackles, no rhonchi.  Normal respiratory effort.  Speaking in full sentences.  Cardiovascular system: RRR no murmurs rubs or gallops.  No JVD.  No lower extremity edema.  Gastrointestinal system: Abdomen is soft, nontender, mildly distended, hypoactive bowel sounds.  No rebound.  No guarding.   Central nervous system: Alert and oriented. No focal neurological deficits. Extremities: Symmetric 5 x 5 power. Skin: No rashes, lesions or ulcers Psychiatry: Judgement and insight appear normal. Mood & affect appropriate.     Data Reviewed: I have personally reviewed following labs and imaging studies  CBC: Recent Labs  Lab 07/04/19 0218 07/05/19 0655 07/06/19 0455  WBC 11.0* 6.5 6.7  NEUTROABS 7.2 4.0 3.8  HGB 14.8 13.2 13.9  HCT 42.5 40.0 43.1  MCV 90.6 94.3 95.1  PLT 175 148* 833   Basic Metabolic Panel: Recent Labs  Lab 07/04/19 0218 07/04/19 1017 07/05/19 0655 07/06/19 0455  NA 131* 136 134* 135  K 4.2 4.2 3.6 3.8  CL 95* 98 106 110  CO2 18* 23 16* 14*  GLUCOSE 206* 151* 79 93  BUN 50* 46* 24* 14  CREATININE 1.21 1.11  0.79 0.84  CALCIUM 8.7* 8.6* 7.9* 7.9*  MG  --  2.2 1.9 2.1   GFR: Estimated Creatinine Clearance: 79.3 mL/min (by C-G formula based on SCr of 0.84 mg/dL). Liver Function Tests: Recent Labs  Lab 07/04/19 0218  AST 20  ALT 23  ALKPHOS 58  BILITOT 1.2  PROT 6.9  ALBUMIN 3.6   No results for input(s): LIPASE, AMYLASE in the last 168 hours. No results for input(s): AMMONIA in the last 168 hours. Coagulation Profile: Recent Labs  Lab 07/04/19 0257  INR 3.9*   Cardiac Enzymes: No results for input(s): CKTOTAL, CKMB, CKMBINDEX, TROPONINI in the last 168 hours. BNP (last 3 results) No results for input(s): PROBNP in the last 8760 hours. HbA1C: Recent Labs    07/04/19 1017  HGBA1C 7.3*   CBG: Recent Labs  Lab 07/05/19 1633 07/05/19 2029 07/06/19 0108 07/06/19 0535 07/06/19 1146  GLUCAP 82 87 83 78 95   Lipid Profile: No results for input(s): CHOL, HDL, LDLCALC, TRIG, CHOLHDL, LDLDIRECT in the last 72 hours. Thyroid Function Tests: No results for input(s): TSH, T4TOTAL, FREET4, T3FREE, THYROIDAB  in the last 72 hours. Anemia Panel: No results for input(s): VITAMINB12, FOLATE, FERRITIN, TIBC, IRON, RETICCTPCT in the last 72 hours. Sepsis Labs: No results for input(s): PROCALCITON, LATICACIDVEN in the last 168 hours.  Recent Results (from the past 240 hour(s))  SARS CORONAVIRUS 2 (TAT 6-24 HRS) Nasopharyngeal Nasopharyngeal Swab     Status: None   Collection Time: 07/04/19  5:14 AM   Specimen: Nasopharyngeal Swab  Result Value Ref Range Status   SARS Coronavirus 2 NEGATIVE NEGATIVE Final    Comment: (NOTE) SARS-CoV-2 target nucleic acids are NOT DETECTED. The SARS-CoV-2 RNA is generally detectable in upper and lower respiratory specimens during the acute phase of infection. Negative results do not preclude SARS-CoV-2 infection, do not rule out co-infections with other pathogens, and should not be used as the sole basis for treatment or other patient management  decisions. Negative results must be combined with clinical observations, patient history, and epidemiological information. The expected result is Negative. Fact Sheet for Patients: SugarRoll.be Fact Sheet for Healthcare Providers: https://www.woods-mathews.com/ This test is not yet approved or cleared by the [place] FDA and  has been authorized for detection and/or diagnosis of SARS-CoV-2 by FDA under an Emergency Use Authorization (EUA). This EUA will remain  in effect (meaning this test can be used) for the duration of the COVID-19 declaration under Section 56 4(b)(1) of the Act, 21 U.S.C. section 360bbb-3(b)(1), unless the authorization is terminated or revoked sooner. Performed at [facility], [place] [address], [place], [place] [address]          Radiology Studies: Dg Chest 2 View  Result Date: 07/05/2019 CLINICAL DATA:  78 year old male admitted to the medical service with 3-day history of abdominal discomfort, abdominal distention, nausea and vomiting. Eval for CHF. Patient has a complex past medical history. EXAM: CHEST - 2 VIEW COMPARISON:  04/28/2018 and earlier FINDINGS: Heart size is normal. There is atherosclerotic calcification of the thoracic aorta. Trace bilateral pleural effusions. No focal consolidations. Opacity at the RIGHT lung apex overlies the RIGHT first rib. A separate indistinct opacity is identified in the [place] lobe. IMPRESSION: 1. Question of RIGHT upper lobe nodules. 2. Further evaluation with chest CT is recommended. 3. Trace bilateral pleural effusions. Electronically Signed   By: Nolon Nations M.D.   On: 07/05/2019 09:51   Dg Abdomen 1 View  Result Date: 07/04/2019 CLINICAL DATA:  Encounter for NG tube replacement. EXAM: ABDOMEN - 1 VIEW COMPARISON:  Abdominal radiograph 07/04/2019 at 7:09 a.m. FINDINGS: Interval placement of a nasogastric tube with side port projecting beyond the GE  junction over the stomach. There are several dilated air-filled loops of small bowel in the mid abdomen raising concern for obstruction. Surgical clips noted in the right upper quadrant. Lumbar spine hardware intact. IMPRESSION: 1. Nasogastric tube side port projects beyond the GE junction over the stomach. 2. Redemonstrated dilated air-filled loops of small bowel in the mid abdomen raising concern for obstruction. Electronically Signed   By: Audie Pinto M.D.   On: 07/04/2019 17:55   Mr Jeri Cos YH Contrast  Result Date: 07/05/2019 CLINICAL DATA:  TIA. Progressive loss of balance and mental status change. Loss of taste. EXAM: MRI HEAD WITHOUT AND WITH CONTRAST TECHNIQUE: Multiplanar, multiecho pulse sequences of the brain and surrounding structures were obtained without and with intravenous contrast. CONTRAST:  10 ML Gadavist COMPARISON:  CT head without contrast 07/04/2019 and 06/02/2018 FINDINGS: Brain: Periventricular white matter changes are mildly advanced for age. Diffusion-weighted images  demonstrate no acute or subacute infarction. No acute hemorrhage or mass lesion is present. The ventricles are of normal size. The study is mildly degraded by patient motion. No significant extraaxial fluid collection is present. The internal auditory canals are within normal limits. The brainstem and cerebellum are within normal limits. Vascular: Flow is present in the major intracranial arteries. Skull and upper cervical spine: The craniocervical junction is normal. Upper cervical spine is within normal limits. Marrow signal is unremarkable. Sinuses/Orbits: The paranasal sinuses and mastoid air cells are clear. The globes and orbits are within normal limits. IMPRESSION: 1. No acute or subacute infarction. 2. No acute intracranial abnormality. 3. Periventricular white matter changes are mildly advanced for age. This likely reflects the sequela of chronic microvascular ischemia. Electronically Signed   By:  San Morelle M.D.   On: 07/05/2019 16:13   Ct Abdomen Pelvis W Contrast  Result Date: 07/04/2019 CLINICAL DATA:  78 year old male with history of abdominal distention, nausea vomiting. Concern for bowel obstruction. EXAM: CT ABDOMEN AND PELVIS WITH CONTRAST TECHNIQUE: Multidetector CT imaging of the abdomen and pelvis was performed using the standard protocol following bolus administration of intravenous contrast. CONTRAST:  52mL OMNIPAQUE IOHEXOL 300 MG/ML SOLN, 118mL OMNIPAQUE IOHEXOL 300 MG/ML SOLN COMPARISON:  CT of the abdomen pelvis dated 04/29/2018 FINDINGS: Lower chest: Partially visualized small bilateral pleural effusions and associated partial compressive atelectasis of the lower lobes. There is mild cardiomegaly. Coronary vascular calcifications noted. No intra-abdominal free air or free fluid. Hepatobiliary: The liver is unremarkable. Mild intrahepatic biliary ductal dilatation. Cholecystectomy. No retained calcified stone noted in the central CBD. Pancreas: Unremarkable. No pancreatic ductal dilatation or surrounding inflammatory changes. Spleen: Normal in size without focal abnormality. Adrenals/Urinary Tract: The adrenal glands are unremarkable. Mild left renal atrophy with cortical scarring primarily involving the inferior pole. Two adjacent left renal inferior pole calculi each measuring approximately 4 mm. There is no hydronephrosis on the left. There is mild right hydronephrosis. No obstructing calculi identified. Multiple right renal cysts noted similar to prior CT. There is symmetric enhancement and excretion of contrast by both kidneys. The urinary bladder is distended. Stomach/Bowel: An enteric tube is noted with tip in the proximal stomach. Mildly dilated loops of contrast filled proximal small bowel measure up to 3.8 cm in caliber. There is apparent mild thickening of a short segment of small bowel the left lower abdomen which may be related to underdistention. Segmental  enteritis is less likely. Clinical correlation is recommended. The distal small bowel and terminal ileum demonstrate a normal caliber. There is an area of tethering of the adjacent bowel loops in the lower abdomen (series 2, image 51 and 45) which may represent areas of adhesion. No discrete transition identified. The appendix is normal. Vascular/Lymphatic: Advanced aortoiliac atherosclerotic disease. 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 No significant change since the prior CT. The IVC is unremarkable. No portal venous gas. There is no adenopathy. Reproductive: The prostate and seminal vesicles are grossly unremarkable. No pelvic mass. Other: Midline vertical anterior pelvic wall incisional scar. Musculoskeletal: Degenerative changes of the spine. Lower lumbar posterior fusion. No acute osseous pathology. IMPRESSION: 1. Mildly dilated proximal small bowel loops may represent ileus. An early or low grade obstruction is less likely but not excluded. Clinical correlation and follow-up with serial KUB recommended to document passage of oral contrast into the colon. 2. Small bilateral pleural effusions with associated partial compressive atelectasis of the lower lobes. 3. No significant interval change in  the 3.4 cm infrarenal abdominal aortic aneurysm status post prior repair. 4. Aortic Atherosclerosis (ICD10-I70.0). Electronically Signed   By: Anner Crete M.D.   On: 07/04/2019 23:57   Dg Abd 2 Views  Result Date: 07/06/2019 CLINICAL DATA:  Abdominal distention with nausea and vomiting. Small bowel obstruction. EXAM: ABDOMEN - 2 VIEW COMPARISON:  CT scan dated 07/04/2019 and abdominal radiographs dated 07/05/2019 FINDINGS: NG tube tip is in the mid stomach. Slightly distended small bowel loops in the left mid abdomen persist. Contrast is present in the nondistended ascending colon. No free air.  No acute bone abnormality. IMPRESSION: Persistent dilated loops of small bowel in  the left mid abdomen consistent with partial small bowel obstruction. Electronically Signed   By: Lorriane Shire M.D.   On: 07/06/2019 13:14   Dg Abd Portable 1v-small Bowel Obstruction Protocol-initial, 8 Hr Delay  Result Date: 07/05/2019 CLINICAL DATA:  8 Hour Delay image for Small Bowel protocol 78 year old male admitted to the medical service with 3-day history of abdominal discomfort, abdominal distention, nausea and vomiting. EXAM: PORTABLE ABDOMEN - 1 VIEW COMPARISON:  CT of the abdomen and pelvis on 07/04/2019 FINDINGS: There is persistent dilatation of small bowel loops, measuring up to 3.9 centimeters in the central abdomen. Contrast is identified within nondilated loops of colon following recent CT exam. Nasogastric tube overlies the stomach. Contrast is identified within the urinary bladder following CT exam. IMPRESSION: Similar small bowel dilatation. Presence of contrast in the colon excludes complete obstruction. Electronically Signed   By: Nolon Nations M.D.   On: 07/05/2019 09:46        Scheduled Meds: . amiodarone  200 mg Oral BID  . amitriptyline  25 mg Oral QHS  . insulin aspart  0-9 Units Subcutaneous Q4H  . metoprolol tartrate  5 mg Intravenous Q8H  . pantoprazole (PROTONIX) IV  40 mg Intravenous Q24H  . pregabalin  300 mg Oral Daily  . thiamine injection  100 mg Intravenous Daily   Continuous Infusions: . sodium bicarbonate 150 mEq in dextrose 5% 1000 mL 150 mEq (07/06/19 1324)     LOS: 1 day    Time spent: 35 minutes    Irine Seal, MD [facility]  If 7PM-7AM, please contact night-coverage [email or web address] 07/06/2019, 1:43 PM

## 2019-07-06 NOTE — Progress Notes (Signed)
[facility] Progress Note     Subjective: CC-  Overall feeling much better. Denies abdominal pain or bloating. Feels hungry. No flatus or BM.   Objective: Vital signs in last 24 hours: Temp:  [97.4 F (36.3 C)-97.7 F (36.5 C)] 97.7 F (36.5 C) (09/08 0532) Pulse Rate:  [79-88] 84 (09/08 0532) Resp:  [18-20] 20 (09/08 0532) BP: (95-128)/(59-71) 128/65 (09/08 0532) SpO2:  [95 %-98 %] 98 % (09/08 0532) Last BM Date: 07/04/19  Intake/Output from previous day: 09/07 0701 - 09/08 0700 In: -  Out: 2215 [Urine:1190; Emesis/NG output:1025] Intake/Output this shift: No intake/output data recorded.  PE: Gen:  Alert, NAD, pleasant HEENT: EOM's intact, pupils equal and round Pulm:  Rate and effort normal Abd: Soft, nontender, nondistended, +BS Skin: warm and dry  Lab Results:  Recent Labs    07/05/19 0655 07/06/19 0455  WBC 6.5 6.7  HGB 13.2 13.9  HCT 40.0 43.1  PLT 148* 157   BMET Recent Labs    07/05/19 0655 07/06/19 0455  NA 134* 135  K 3.6 3.8  CL 106 110  CO2 16* 14*  GLUCOSE 79 93  BUN 24* 14  CREATININE 0.79 0.84  CALCIUM 7.9* 7.9*   PT/INR Recent Labs    07/04/19 0257  LABPROT 37.8*  INR 3.9*   CMP     Component Value Date/Time   NA 135 07/06/2019 0455   NA 140 06/02/2018 1001   K 3.8 07/06/2019 0455   CL 110 07/06/2019 0455   CO2 14 (L) 07/06/2019 0455   GLUCOSE 93 07/06/2019 0455   BUN 14 07/06/2019 0455   BUN 24 06/02/2018 1001   CREATININE 0.84 07/06/2019 0455   CREATININE 1.03 01/09/2016 1129   CALCIUM 7.9 (L) 07/06/2019 0455   PROT 6.9 07/04/2019 0218   PROT 6.2 06/02/2018 1001   ALBUMIN 3.6 07/04/2019 0218   ALBUMIN 3.9 06/02/2018 1001   AST 20 07/04/2019 0218   ALT 23 07/04/2019 0218   ALKPHOS 58 07/04/2019 0218   BILITOT 1.2 07/04/2019 0218   BILITOT 0.5 06/02/2018 1001   GFRNONAA >60 07/06/2019 0455   GFRAA >60 07/06/2019 0455   Lipase     Component Value Date/Time   LIPASE 28 12/21/2016 1847        Studies/Results: Dg Chest 2 View  Result Date: 07/05/2019 CLINICAL DATA:  78 year old male admitted to the medical service with 3-day history of abdominal discomfort, abdominal distention, nausea and vomiting. Eval for CHF. Patient has a complex past medical history. EXAM: CHEST - 2 VIEW COMPARISON:  04/28/2018 and earlier FINDINGS: Heart size is normal. There is atherosclerotic calcification of the thoracic aorta. Trace bilateral pleural effusions. No focal consolidations. Opacity at the RIGHT lung apex overlies the RIGHT first rib. A separate indistinct opacity is identified in the [place] lobe. IMPRESSION: 1. Question of RIGHT upper lobe nodules. 2. Further evaluation with chest CT is recommended. 3. Trace bilateral pleural effusions. Electronically Signed   By: Nolon Nations M.D.   On: 07/05/2019 09:51   Dg Abdomen 1 View  Result Date: 07/04/2019 CLINICAL DATA:  Encounter for NG tube replacement. EXAM: ABDOMEN - 1 VIEW COMPARISON:  Abdominal radiograph 07/04/2019 at 7:09 a.m. FINDINGS: Interval placement of a nasogastric tube with side port projecting beyond the GE junction over the stomach. There are several dilated air-filled loops of small bowel in the mid abdomen raising concern for obstruction. Surgical clips noted in the right upper quadrant. Lumbar spine hardware intact. IMPRESSION: 1.  Nasogastric tube side port projects beyond the GE junction over the stomach. 2. Redemonstrated dilated air-filled loops of small bowel in the mid abdomen raising concern for obstruction. Electronically Signed   By: Audie Pinto M.D.   On: 07/04/2019 17:55   Mr Jeri Cos UE Contrast  Result Date: 07/05/2019 CLINICAL DATA:  TIA. Progressive loss of balance and mental status change. Loss of taste. EXAM: MRI HEAD WITHOUT AND WITH CONTRAST TECHNIQUE: Multiplanar, multiecho pulse sequences of the brain and surrounding structures were obtained without and with intravenous contrast. CONTRAST:   10 ML Gadavist COMPARISON:  CT head without contrast 07/04/2019 and 06/02/2018 FINDINGS: Brain: Periventricular white matter changes are mildly advanced for age. Diffusion-weighted images demonstrate no acute or subacute infarction. No acute hemorrhage or mass lesion is present. The ventricles are of normal size. The study is mildly degraded by patient motion. No significant extraaxial fluid collection is present. The internal auditory canals are within normal limits. The brainstem and cerebellum are within normal limits. Vascular: Flow is present in the major intracranial arteries. Skull and upper cervical spine: The craniocervical junction is normal. Upper cervical spine is within normal limits. Marrow signal is unremarkable. Sinuses/Orbits: The paranasal sinuses and mastoid air cells are clear. The globes and orbits are within normal limits. IMPRESSION: 1. No acute or subacute infarction. 2. No acute intracranial abnormality. 3. Periventricular white matter changes are mildly advanced for age. This likely reflects the sequela of chronic microvascular ischemia. Electronically Signed   By: San Morelle M.D.   On: 07/05/2019 16:13   Ct Abdomen Pelvis W Contrast  Result Date: 07/04/2019 CLINICAL DATA:  78 year old male with history of abdominal distention, nausea vomiting. Concern for bowel obstruction. EXAM: CT ABDOMEN AND PELVIS WITH CONTRAST TECHNIQUE: Multidetector CT imaging of the abdomen and pelvis was performed using the standard protocol following bolus administration of intravenous contrast. CONTRAST:  64mL OMNIPAQUE IOHEXOL 300 MG/ML SOLN, 144mL OMNIPAQUE IOHEXOL 300 MG/ML SOLN COMPARISON:  CT of the abdomen pelvis dated 04/29/2018 FINDINGS: Lower chest: Partially visualized small bilateral pleural effusions and associated partial compressive atelectasis of the lower lobes. There is mild cardiomegaly. Coronary vascular calcifications noted. No intra-abdominal free air or free fluid.  Hepatobiliary: The liver is unremarkable. Mild intrahepatic biliary ductal dilatation. Cholecystectomy. No retained calcified stone noted in the central CBD. Pancreas: Unremarkable. No pancreatic ductal dilatation or surrounding inflammatory changes. Spleen: Normal in size without focal abnormality. Adrenals/Urinary Tract: The adrenal glands are unremarkable. Mild left renal atrophy with cortical scarring primarily involving the inferior pole. Two adjacent left renal inferior pole calculi each measuring approximately 4 mm. There is no hydronephrosis on the left. There is mild right hydronephrosis. No obstructing calculi identified. Multiple right renal cysts noted similar to prior CT. There is symmetric enhancement and excretion of contrast by both kidneys. The urinary bladder is distended. Stomach/Bowel: An enteric tube is noted with tip in the proximal stomach. Mildly dilated loops of contrast filled proximal small bowel measure up to 3.8 cm in caliber. There is apparent mild thickening of a short segment of small bowel the left lower abdomen which may be related to underdistention. Segmental enteritis is less likely. Clinical correlation is recommended. The distal small bowel and terminal ileum demonstrate a normal caliber. There is an area of tethering of the adjacent bowel loops in the lower abdomen (series 2, image 51 and 45) which may represent areas of adhesion. No discrete transition identified. The appendix is normal. Vascular/Lymphatic: Advanced aortoiliac atherosclerotic disease. A 3.4 cm infrarenal  abdominal aortic aneurysm status post prior repair. No significant change since the prior CT. The IVC is unremarkable. No portal venous gas. There is no adenopathy. Reproductive: The prostate and seminal vesicles are grossly unremarkable. No pelvic mass. Other: Midline vertical anterior pelvic wall incisional scar. Musculoskeletal: Degenerative changes of the spine. Lower lumbar posterior fusion. No acute  osseous pathology. IMPRESSION: 1. Mildly dilated proximal small bowel loops may represent ileus. An early or low grade obstruction is less likely but not excluded. Clinical correlation and follow-up with serial KUB recommended to document passage of oral contrast into the colon. 2. Small bilateral pleural effusions with associated partial compressive atelectasis of the lower lobes. 3. No significant interval change in the 3.4 cm infrarenal abdominal aortic aneurysm status post prior repair. 4. Aortic Atherosclerosis (ICD10-I70.0). Electronically Signed   By: Anner Crete M.D.   On: 07/04/2019 23:57   Dg Abd Portable 1v-small Bowel Obstruction Protocol-initial, 8 Hr Delay  Result Date: 07/05/2019 CLINICAL DATA:  8 Hour Delay image for Small Bowel protocol 78 year old male admitted to the medical service with 3-day history of abdominal discomfort, abdominal distention, nausea and vomiting. EXAM: PORTABLE ABDOMEN - 1 VIEW COMPARISON:  CT of the abdomen and pelvis on 07/04/2019 FINDINGS: There is persistent dilatation of small bowel loops, measuring up to 3.9 centimeters in the central abdomen. Contrast is identified within nondilated loops of colon following recent CT exam. Nasogastric tube overlies the stomach. Contrast is identified within the urinary bladder following CT exam. IMPRESSION: Similar small bowel dilatation. Presence of contrast in the colon excludes complete obstruction. Electronically Signed   By: Nolon Nations M.D.   On: 07/05/2019 09:46    Anti-infectives: Anti-infectives (From admission, onward)   None       Assessment/Plan Paroxysmal atrial fibrillation - hold xarelto DM Poly-neuropathy  HTN CHF CAD s/p LAD stenting H/o AAA repair 2009  SBO - likely partial SBO due to adhesions - no flatus or BM but abdomen soft/nondistended and with good bowel sounds - xray yesterday showed persistently dilated loops of bowel, contrast in colon - high NG tube output: 1025cc in the  last 24 hours - Repeat xray pending today  ID - none FEN - IVF, NPO/NGT to LIWS VTE - SCDs, ok for lovenox or heparin from surgical standpoint Foley - none Follow up - TBD  Plan - Clamp NG tube and allow sips of clear liquids. Mobilize.    LOS: 1 day    Wellington Hampshire , [facility] 07/06/2019, 11:48 AM Pager: [phone] Mon-Thurs 7:00 am-4:30 pm Fri 7:00 am -11:30 AM Sat-Sun 7:00 am-11:30 am

## 2019-07-06 NOTE — Progress Notes (Signed)
Initial Nutrition Assessment  INTERVENTION:   -Diet advancement per MD -Will monitor for nutritional needs following diet advancement  NUTRITION DIAGNOSIS:   Inadequate oral intake related to (PSBO) as evidenced by NPO status  GOAL:   Patient will meet greater than or equal to 90% of their needs  MONITOR:   Diet advancement, Labs, Weight trends, I & O's  REASON FOR ASSESSMENT:   Malnutrition Screening Tool    ASSESSMENT:   78 year old gentleman history of mild congestive heart failure, diabetes, A. fib on chronic anticoagulation, post open repair of abdominal aortic aneurysm in 2009, incisional hernia repair presenting to the ED with a 3-day history of abdominal distention, nausea and vomiting. Admitted for partial SBO.  **RD working remotely**  Patient with partial small bowel obstruction, reports N/V for 3 days PTA.  Pt with NGT, plan per surgery is to clamp today to allow for sips of clears.  Currently NPO. Pt with improved appetite, will monitor for diet advancement and order supplements if deemed necessary at that time.   Per weight records, no weight loss noted.  I/Os: +302 ml since admit UOP 9/7: 1190 ml NGT output 9/7: 1025 ml  Medications: IV Thiamine  Labs reviewed: CBGs: 78-95   NUTRITION - FOCUSED PHYSICAL EXAM:  Unable to perform -working remotely.  Diet Order:   Diet Order            Diet NPO time specified Except for: Ice Chips, Sips with Meds, Other (See Comments)  Diet effective now              EDUCATION NEEDS:   No education needs have been identified at this time  Skin:  Skin Assessment: Reviewed RN Assessment  Last BM:  9/6  Height:   Ht Readings from Last 1 Encounters:  07/04/19 6\' 1"  (1.854 m)    Weight:   Wt Readings from Last 1 Encounters:  07/04/19 94 kg    Ideal Body Weight:  83.6 kg  BMI:  Body mass index is 27.34 kg/m.  Estimated Nutritional Needs:   Kcal:  2100-2300  Protein:  100-110g  Fluid:   2.1L/day  Clayton Bibles, MS, RD, LDN Inpatient Clinical Dietitian Pager: [phone] After Hours Pager: [phone]

## 2019-07-07 ENCOUNTER — Inpatient Hospital Stay (HOSPITAL_COMMUNITY): Payer: Medicare Other

## 2019-07-07 LAB — CBC
HCT: 41.5 % (ref 39.0–52.0)
Hemoglobin: 14 g/dL (ref 13.0–17.0)
MCH: 31.2 pg (ref 26.0–34.0)
MCHC: 33.7 g/dL (ref 30.0–36.0)
MCV: 92.4 fL (ref 80.0–100.0)
Platelets: 156 10*3/uL (ref 150–400)
RBC: 4.49 MIL/uL (ref 4.22–5.81)
RDW: 14.6 % (ref 11.5–15.5)
WBC: 6.1 10*3/uL (ref 4.0–10.5)
nRBC: 0 % (ref 0.0–0.2)

## 2019-07-07 LAB — BASIC METABOLIC PANEL
Anion gap: 11 (ref 5–15)
BUN: 9 mg/dL (ref 8–23)
CO2: 18 mmol/L — ABNORMAL LOW (ref 22–32)
Calcium: 8.5 mg/dL — ABNORMAL LOW (ref 8.9–10.3)
Chloride: 108 mmol/L (ref 98–111)
Creatinine, Ser: 0.89 mg/dL (ref 0.61–1.24)
GFR calc Af Amer: >60 mL/min (ref >60)
GFR calc non Af Amer: >60 mL/min (ref >60)
Glucose, Bld: 158 mg/dL — ABNORMAL HIGH (ref 70–99)
Potassium: 3.6 mmol/L (ref 3.5–5.1)
Sodium: 137 mmol/L (ref 135–145)

## 2019-07-07 LAB — GLUCOSE, CAPILLARY
Glucose-Capillary: 138 mg/dL — ABNORMAL HIGH (ref 70–99)
Glucose-Capillary: 155 mg/dL — ABNORMAL HIGH (ref 70–99)
Glucose-Capillary: 135 mg/dL — ABNORMAL HIGH (ref 70–99)
Glucose-Capillary: 134 mg/dL — ABNORMAL HIGH (ref 70–99)

## 2019-07-07 MED ORDER — POTASSIUM CHLORIDE CRYS ER 20 MEQ PO TBCR
40.0000 meq | EXTENDED_RELEASE_TABLET | Freq: Once | ORAL | Status: AC
  Administered 2019-07-07: 40 meq via ORAL
  Filled 2019-07-07: qty 2

## 2019-07-07 MED ORDER — ENOXAPARIN SODIUM 80 MG/0.8ML ~~LOC~~ SOLN
75.0000 mg | Freq: Two times a day (BID) | SUBCUTANEOUS | Status: DC
  Administered 2019-07-07 – 2019-07-08 (×2): 75 mg via SUBCUTANEOUS
  Filled 2019-07-07 (×2): qty 0.8

## 2019-07-07 MED ORDER — ENOXAPARIN SODIUM 80 MG/0.8ML ~~LOC~~ SOLN
75.0000 mg | SUBCUTANEOUS | Status: AC
  Administered 2019-07-07: 75 mg via SUBCUTANEOUS
  Filled 2019-07-07: qty 0.8

## 2019-07-07 NOTE — Progress Notes (Signed)
[place] for lovenox Indication: hx atrial fibrillation (PTA xarelto on hold)  Allergies  Allergen Reactions  . Cymbalta [Duloxetine Hcl] Nausea And Vomiting and Other (See Comments)    PATIENT STATED THAT HE FELT LIKE HE "WAS GOING TO DIE" Rapid drop in blood pressure; sent him to the ER X2  . Neurontin [Gabapentin] Other (See Comments)    "Makes me goofy, keeps me off balance, clouds my thinking.."  . Keppra [Levetiracetam] Nausea And Vomiting    Patient Measurements: Height: 6\' 1"  (185.4 cm) Weight: 207 lb 3.7 oz (94 kg) IBW/kg (Calculated) : 79.9 Heparin Dosing Weight:   Vital Signs: Temp: 97.6 F (36.4 C) (09/09 0621) BP: 130/67 (09/09 0621) Pulse Rate: 83 (09/09 0621)  Labs: Recent Labs    07/05/19 0655 07/06/19 0455 07/07/19 0524  HGB 13.2 13.9 14.0  HCT 40.0 43.1 41.5  PLT 148* 157 156  CREATININE 0.79 0.84 0.89    Estimated Creatinine Clearance: 74.8 mL/min (by C-G formula based on SCr of 0.89 mg/dL).   Medications:  - on xarelto 20 mg daily PTA (last dose taken on 9/4)  Assessment: Patient's an 78 y.o M with hx afib on xarelto PTA, presented to the ED on 07/04/19 with c/o dehydration, dizziness and distended abdomen. Abd x-ray showed findings consistent with distal small bowel obstruction. Xarelto was held on admission d/t NPO status and in case surgical intervention is needed.  Planning for med management of SBO at this time.  Pharmacy is consulted to dose full dose lovenox on 07/07/19 while off xarelto.  Goal of Therapy:  Anti-Xa level 0.6-1 units/ml 4hrs after LMWH dose given Monitor platelets by anticoagulation protocol: Yes   Plan:  - lovenox 75 mg SQ q12h - cbc q72 hrs - monitor for s/s bleeding   Jabre Heo P 07/07/2019,1:28 PM

## 2019-07-07 NOTE — Progress Notes (Signed)
[facility] Progress Note     Subjective: CC-  Tired this morning. Denies abdominal pain. Denies n/v. Passed flatus x1. No BM. NG tube was clamped intermittently yesterday but did not remain off suction.  Objective: Vital signs in last 24 hours: Temp:  [97.5 F (36.4 C)-97.6 F (36.4 C)] 97.6 F (36.4 C) (09/09 0621) Pulse Rate:  [83-91] 83 (09/09 0621) Resp:  [16-20] 16 (09/09 0621) BP: (121-133)/(67-73) 130/67 (09/09 0621) SpO2:  [97 %-100 %] 97 % (09/09 0621) Last BM Date: 07/04/19  Intake/Output from previous day: 09/08 0701 - 09/09 0700 In: 3018.1 [P.O.:600; I.V.:2418.1] Out: 600 [Urine:300; Emesis/NG output:300] Intake/Output this shift: No intake/output data recorded.  PE: Gen:  Alert, NAD, pleasant HEENT: EOM's intact, pupils equal and round Pulm:  Rate and effort normal Abd: Soft, nontender, nondistended, +BS Skin: warm and dry  Lab Results:  Recent Labs    07/06/19 0455 07/07/19 0524  WBC 6.7 6.1  HGB 13.9 14.0  HCT 43.1 41.5  PLT 157 156   BMET Recent Labs    07/06/19 0455 07/07/19 0524  NA 135 137  K 3.8 3.6  CL 110 108  CO2 14* 18*  GLUCOSE 93 158*  BUN 14 9  CREATININE 0.84 0.89  CALCIUM 7.9* 8.5*   PT/INR No results for input(s): LABPROT, INR in the last 72 hours. CMP     Component Value Date/Time   NA 137 07/07/2019 0524   NA 140 06/02/2018 1001   K 3.6 07/07/2019 0524   CL 108 07/07/2019 0524   CO2 18 (L) 07/07/2019 0524   GLUCOSE 158 (H) 07/07/2019 0524   BUN 9 07/07/2019 0524   BUN 24 06/02/2018 1001   CREATININE 0.89 07/07/2019 0524   CREATININE 1.03 01/09/2016 1129   CALCIUM 8.5 (L) 07/07/2019 0524   PROT 6.9 07/04/2019 0218   PROT 6.2 06/02/2018 1001   ALBUMIN 3.6 07/04/2019 0218   ALBUMIN 3.9 06/02/2018 1001   AST 20 07/04/2019 0218   ALT 23 07/04/2019 0218   ALKPHOS 58 07/04/2019 0218   BILITOT 1.2 07/04/2019 0218   BILITOT 0.5 06/02/2018 1001   GFRNONAA >60 07/07/2019 0524   GFRAA >60  07/07/2019 0524   Lipase     Component Value Date/Time   LIPASE 28 12/21/2016 1847       Studies/Results: Mr Jeri Cos Wo Contrast  Result Date: 07/05/2019 CLINICAL DATA:  TIA. Progressive loss of balance and mental status change. Loss of taste. EXAM: MRI HEAD WITHOUT AND WITH CONTRAST TECHNIQUE: Multiplanar, multiecho pulse sequences of the brain and surrounding structures were obtained without and with intravenous contrast. CONTRAST:  10 ML Gadavist COMPARISON:  CT head without contrast 07/04/2019 and 06/02/2018 FINDINGS: Brain: Periventricular white matter changes are mildly advanced for age. Diffusion-weighted images demonstrate no acute or subacute infarction. No acute hemorrhage or mass lesion is present. The ventricles are of normal size. The study is mildly degraded by patient motion. No significant extraaxial fluid collection is present. The internal auditory canals are within normal limits. The brainstem and cerebellum are within normal limits. Vascular: Flow is present in the major intracranial arteries. Skull and upper cervical spine: The craniocervical junction is normal. Upper cervical spine is within normal limits. Marrow signal is unremarkable. Sinuses/Orbits: The paranasal sinuses and mastoid air cells are clear. The globes and orbits are within normal limits. IMPRESSION: 1. No acute or subacute infarction. 2. No acute intracranial abnormality. 3. Periventricular white matter changes are mildly advanced for age. This likely reflects  the sequela of chronic microvascular ischemia. Electronically Signed   By: San Morelle M.D.   On: 07/05/2019 16:13   Dg Abd 2 Views  Result Date: 07/07/2019 CLINICAL DATA:  Follow-up small bowel obstruction EXAM: ABDOMEN - 2 VIEW COMPARISON:  None. FINDINGS: Oral contrast material is present in the ascending colon. Persistent gaseous distension of the small bowel and colon. There is no evidence of pneumoperitoneum, portal venous gas or pneumatosis.  There are no pathologic calcifications along the expected course of the ureters. The osseous structures are unremarkable. IMPRESSION: Persistent gaseous distension of the small bowel and colon with transit of the oral contrast material into the ascending colon. This may reflect persistent partial small bowel obstruction versus an ileus. Electronically Signed   By: Kathreen Devoid   On: 07/07/2019 11:24   Dg Abd 2 Views  Result Date: 07/06/2019 CLINICAL DATA:  Abdominal distention with nausea and vomiting. Small bowel obstruction. EXAM: ABDOMEN - 2 VIEW COMPARISON:  CT scan dated 07/04/2019 and abdominal radiographs dated 07/05/2019 FINDINGS: NG tube tip is in the mid stomach. Slightly distended small bowel loops in the left mid abdomen persist. Contrast is present in the nondistended ascending colon. No free air.  No acute bone abnormality. IMPRESSION: Persistent dilated loops of small bowel in the left mid abdomen consistent with partial small bowel obstruction. Electronically Signed   By: Lorriane Shire M.D.   On: 07/06/2019 13:14    Anti-infectives: Anti-infectives (From admission, onward)   None       Assessment/Plan Paroxysmal atrial fibrillation - hold xarelto DM Poly-neuropathy  HTN CHF CAD s/p LAD stenting H/o AAA repair 2009  SBO - likely partial SBO due to adhesions - passed flatus x1 today, no BM - XR shows persistent gaseous distension of the small bowel and colon with contrast in colon, pSBO vs ileus - Clamp NG tube and allow sips of clear liquids. Mobilize. If patient becomes nauseated unclamp NG tube. Labs in AM.  ID - none FEN - IVF, NPO/NGT to LIWS VTE - SCDs, ok for lovenox or heparin from surgical standpoint Foley - none Follow up - TBD   LOS: 2 days    Wellington Hampshire , [facility] 07/07/2019, 11:51 AM Pager: [phone] Mon-Thurs 7:00 am-4:30 pm Fri 7:00 am -11:30 AM Sat-Sun 7:00 am-11:30 am

## 2019-07-07 NOTE — Progress Notes (Signed)
PROGRESS NOTE    [name]  PPI:[id number] DOB: Jun 07, 1941 DOA: 07/04/2019 PCP: [name], MD    Brief Narrative:  Patient is a 78 year old gentleman history of mild congestive heart failure, diabetes, A. fib on chronic anticoagulation, post open repair of abdominal aortic aneurysm in 2009, incisional hernia repair presenting to the ED with a 3-day history of abdominal distention, nausea and vomiting.  Patient also noted to be orthostatic with presyncopal episodes prior to admission.  Abdominal films done consistent with distal small bowel obstruction.  General surgery consulted.  Small bowel protocol ordered.  NG tube placed.  Anticoagulation on hold.  CT abdomen and pelvis ordered consistent with partial small bowel obstruction.  MRI head pending.  Patient placed on IV fluids, supportive care.   Assessment & Plan:   Principal Problem:   Small bowel obstruction (HCC) Active Problems:   DM (diabetes mellitus), type 2, uncontrolled w/neurologic complication (HCC)   Paroxysmal atrial fibrillation with RVR (HCC)   Chronic combined systolic and diastolic heart failure (HCC)   Near syncope   Postural dizziness with presyncope   Postural dizziness with near syncope   Abdominal distention, non-gaseous   Change in mental status   Acute metabolic encephalopathy   Orthostatic dizziness  1 partial small bowel obstruction Likely secondary to adhesions.  Small bowel protocol consistent with small bowel obstruction.  CT abdomen and pelvis consistent with partial small bowel obstruction.  Patient with no flatus or bowel movement.  NG tube in place.  Abdominal films with persistent dilated loops of bowels.  NG tube output 300 cc over the past 24 hours.  NG tube has been clamped and patient on ice chips and sips of clears from floor stock.  Abdominal films with no significant improvement.  General surgery following and appreciate input and recommendations.  2.  Near syncope likely secondary to  orthostasis Patient on admission, near syncopal episode likely secondary to volume depletion.  Patient noted to be orthostatic on admission.  Patient with no signs or symptoms of infection.  Improving with hydration.  Continue IV fluids. Follow.  3.  Paroxysmal atrial fibrillation Currently rate controlled.  Continue amiodarone.  Continue IV Lopressor while patient n.p.o.  Oral anticoagulation on hold.  Place on full dose Lovenox for now.  Follow.    4.  Acute metabolic encephalopathy Per admitting physician was noted that patient's wife and stated that patient had some increased confusion, delusion, hallucination and mild disorientation.  Patient alert oriented to self place and time.  Improved.  CT head negative.  MRI brain negative.  IV fluids.  Supportive care.  5.  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  CBG of 138 this morning.  Patient currently n.p.o.  Continue to hold oral hypoglycemic agents.  Continue Lyrica.  Lidocaine ointment TID prn.  Sliding scale insulin.  6.  Hypertension Blood pressure was borderline on admission.  Improving with hydration.  Patient currently n.p.o. due to small bowel obstructions.  On IV Lopressor.  Follow.   7.  Chronic combined systolic and diastolic CHF/coronary artery disease status post LAD stenting Stable.  ACE inhibitor on hold due to borderline blood pressure.  Patient on IV Lopressor.  Outpatient follow-up with cardiology.  Monitor closely for volume overload.     DVT prophylaxis: Lovenox Code Status: Full Family Communication: Updated patient.  No family at bedside.  Disposition Plan: Home once small bowel obstruction is resolved when okay with general surgery.   Consultants:   General  surgery: Dr. Harlow Asa 07/04/2019  Procedures:   MRI head 07/05/2019  CT abdomen and pelvis 07/04/2019  CT head 07/04/2019  Small bowel protocol  Abdominal films 07/04/2019, 07/05/2019  Chest x-ray 07/05/2019  Antimicrobials:    None   Subjective: Patient sleeping however easily arousable.  Denies any further nausea or emesis.  Denies any abdominal pain.  Weakness improving.  Feeling better.  No bowel movement.  Passing flatus once.    Objective: Vitals:   07/06/19 0532 07/06/19 1336 07/06/19 2024 07/07/19 0621  BP: 128/65 121/71 133/73 130/67  Pulse: 84 84 91 83  Resp: 20 20 16 16   Temp: 97.7 F (36.5 C) (!) 97.5 F (36.4 C) (!) 97.5 F (36.4 C) 97.6 F (36.4 C)  TempSrc: Oral  Oral   SpO2: 98% 100% 100% 97%  Weight:      Height:        Intake/Output Summary (Last 24 hours) at 07/07/2019 1334 Last data filed at 07/07/2019 1200 Gross per 24 hour  Intake 3018.09 ml  Output 850 ml  Net 2168.09 ml   Filed Weights   07/04/19 0508 07/04/19 1822  Weight: 88.5 kg 94 kg    Examination:  General exam: NAD.  NG tube in place and clamped.[name] Respiratory system: CTA B.  No wheezes, no crackles, no rhonchi.  Normal respiratory effort.  Speaking in full sentences.  Cardiovascular system: Regular rate rhythm no murmurs rubs or gallops.  No JVD.  No lower extremity edema.  Gastrointestinal system: Abdomen is nontender, nondistended, soft, hypoactive bowel sounds.  No rebound.  No guarding.    Central nervous system: Alert and oriented. No focal neurological deficits. Extremities: Symmetric 5 x 5 power. Skin: No rashes, lesions or ulcers Psychiatry: Judgement and insight appear normal. Mood & affect appropriate.     Data Reviewed: I have personally reviewed following labs and imaging studies  CBC: Recent Labs  Lab 07/04/19 0218 07/05/19 0655 07/06/19 0455 07/07/19 0524  WBC 11.0* 6.5 6.7 6.1  NEUTROABS 7.2 4.0 3.8  --   HGB 14.8 13.2 13.9 14.0  HCT 42.5 40.0 43.1 41.5  MCV 90.6 94.3 95.1 92.4  PLT 175 148* 157 161   Basic Metabolic Panel: Recent Labs  Lab 07/04/19 0218 07/04/19 1017 07/05/19 0655 07/06/19 0455 07/07/19 0524  NA 131* 136 134* 135 137  K 4.2 4.2 3.6 3.8 3.6  CL 95* 98 106  110 108  CO2 18* 23 16* 14* 18*  GLUCOSE 206* 151* 79 93 158*  BUN 50* 46* 24* 14 9  CREATININE 1.21 1.11 0.79 0.84 0.89  CALCIUM 8.7* 8.6* 7.9* 7.9* 8.5*  MG  --  2.2 1.9 2.1  --    GFR: Estimated Creatinine Clearance: 74.8 mL/min (by C-G formula based on SCr of 0.89 mg/dL). Liver Function Tests: Recent Labs  Lab 07/04/19 0218  AST 20  ALT 23  ALKPHOS 58  BILITOT 1.2  PROT 6.9  ALBUMIN 3.6   No results for input(s): LIPASE, AMYLASE in the last 168 hours. No results for input(s): AMMONIA in the last 168 hours. Coagulation Profile: Recent Labs  Lab 07/04/19 0257  INR 3.9*   Cardiac Enzymes: No results for input(s): CKTOTAL, CKMB, CKMBINDEX, TROPONINI in the last 168 hours. BNP (last 3 results) No results for input(s): PROBNP in the last 8760 hours. HbA1C: No results for input(s): HGBA1C in the last 72 hours. CBG: Recent Labs  Lab 07/06/19 1146 07/06/19 1626 07/06/19 2026 07/07/19 0755 07/07/19 1144  GLUCAP 95 132* 165*  138* 155*   Lipid Profile: No results for input(s): CHOL, HDL, LDLCALC, TRIG, CHOLHDL, LDLDIRECT in the last 72 hours. Thyroid Function Tests: No results for input(s): TSH, T4TOTAL, FREET4, T3FREE, THYROIDAB in the last 72 hours. Anemia Panel: No results for input(s): VITAMINB12, FOLATE, FERRITIN, TIBC, IRON, RETICCTPCT in the last 72 hours. Sepsis Labs: No results for input(s): PROCALCITON, LATICACIDVEN in the last 168 hours.  Recent Results (from the past 240 hour(s))  SARS CORONAVIRUS 2 (TAT 6-24 HRS) Nasopharyngeal Nasopharyngeal Swab     Status: None   Collection Time: 07/04/19  5:14 AM   Specimen: Nasopharyngeal Swab  Result Value Ref Range Status   SARS Coronavirus 2 NEGATIVE NEGATIVE Final    Comment: (NOTE) SARS-CoV-2 target nucleic acids are NOT DETECTED. The SARS-CoV-2 RNA is generally detectable in upper and lower respiratory specimens during the acute phase of infection. Negative results do not preclude SARS-CoV-2 infection,  do not rule out co-infections with other pathogens, and should not be used as the sole basis for treatment or other patient management decisions. Negative results must be combined with clinical observations, patient history, and epidemiological information. The expected result is Negative. Fact Sheet for Patients: SugarRoll.be Fact Sheet for Healthcare Providers: https://www.woods-mathews.com/ This test is not yet approved or cleared by the [place] FDA and  has been authorized for detection and/or diagnosis of SARS-CoV-2 by FDA under an Emergency Use Authorization (EUA). This EUA will remain  in effect (meaning this test can be used) for the duration of the COVID-19 declaration under Section 56 4(b)(1) of the Act, 21 U.S.C. section 360bbb-3(b)(1), unless the authorization is terminated or revoked sooner. Performed at [facility], [place] [address], [place], [place] [address]          Radiology Studies: Mr Jeri Cos YH Contrast  Result Date: 07/05/2019 CLINICAL DATA:  TIA. Progressive loss of balance and mental status change. Loss of taste. EXAM: MRI HEAD WITHOUT AND WITH CONTRAST TECHNIQUE: Multiplanar, multiecho pulse sequences of the brain and surrounding structures were obtained without and with intravenous contrast. CONTRAST:  10 ML Gadavist COMPARISON:  CT head without contrast 07/04/2019 and 06/02/2018 FINDINGS: Brain: Periventricular white matter changes are mildly advanced for age. Diffusion-weighted images demonstrate no acute or subacute infarction. No acute hemorrhage or mass lesion is present. The ventricles are of normal size. The study is mildly degraded by patient motion. No significant extraaxial fluid collection is present. The internal auditory canals are within normal limits. The brainstem and cerebellum are within normal limits. Vascular: Flow is present in the major intracranial arteries. Skull and upper cervical spine:  The craniocervical junction is normal. Upper cervical spine is within normal limits. Marrow signal is unremarkable. Sinuses/Orbits: The paranasal sinuses and mastoid air cells are clear. The globes and orbits are within normal limits. IMPRESSION: 1. No acute or subacute infarction. 2. No acute intracranial abnormality. 3. Periventricular white matter changes are mildly advanced for age. This likely reflects the sequela of chronic microvascular ischemia. Electronically Signed   By: San Morelle M.D.   On: 07/05/2019 16:13   Dg Abd 2 Views  Result Date: 07/07/2019 CLINICAL DATA:  Follow-up small bowel obstruction EXAM: ABDOMEN - 2 VIEW COMPARISON:  None. FINDINGS: Oral contrast material is present in the ascending colon. Persistent gaseous distension of the small bowel and colon. There is no evidence of pneumoperitoneum, portal venous gas or pneumatosis. There are no pathologic calcifications along the expected course of the ureters. The osseous structures are unremarkable. IMPRESSION: Persistent  gaseous distension of the small bowel and colon with transit of the oral contrast material into the ascending colon. This may reflect persistent partial small bowel obstruction versus an ileus. Electronically Signed   By: Kathreen Devoid   On: 07/07/2019 11:24   Dg Abd 2 Views  Result Date: 07/06/2019 CLINICAL DATA:  Abdominal distention with nausea and vomiting. Small bowel obstruction. EXAM: ABDOMEN - 2 VIEW COMPARISON:  CT scan dated 07/04/2019 and abdominal radiographs dated 07/05/2019 FINDINGS: NG tube tip is in the mid stomach. Slightly distended small bowel loops in the left mid abdomen persist. Contrast is present in the nondistended ascending colon. No free air.  No acute bone abnormality. IMPRESSION: Persistent dilated loops of small bowel in the left mid abdomen consistent with partial small bowel obstruction. Electronically Signed   By: Lorriane Shire M.D.   On: 07/06/2019 13:14        Scheduled  Meds: . amiodarone  200 mg Oral BID  . amitriptyline  25 mg Oral QHS  . insulin aspart  0-9 Units Subcutaneous Q4H  . metoprolol tartrate  5 mg Intravenous Q8H  . pantoprazole (PROTONIX) IV  40 mg Intravenous Q24H  . pregabalin  300 mg Oral Daily  . thiamine injection  100 mg Intravenous Daily   Continuous Infusions: . sodium bicarbonate 150 mEq in dextrose 5% 1000 mL 150 mEq (07/07/19 0353)     LOS: 2 days    Time spent: 35 minutes    Irine Seal, MD [facility]  If 7PM-7AM, please contact night-coverage [email or web address] 07/07/2019, 1:34 PM

## 2019-07-08 ENCOUNTER — Inpatient Hospital Stay (HOSPITAL_COMMUNITY): Payer: Medicare Other

## 2019-07-08 DIAGNOSIS — E876 Hypokalemia: Secondary | ICD-10-CM | POA: Diagnosis not present

## 2019-07-08 LAB — GLUCOSE, CAPILLARY
Glucose-Capillary: 132 mg/dL — ABNORMAL HIGH (ref 70–99)
Glucose-Capillary: 140 mg/dL — ABNORMAL HIGH (ref 70–99)
Glucose-Capillary: 144 mg/dL — ABNORMAL HIGH (ref 70–99)
Glucose-Capillary: 149 mg/dL — ABNORMAL HIGH (ref 70–99)
Glucose-Capillary: 172 mg/dL — ABNORMAL HIGH (ref 70–99)
Glucose-Capillary: 193 mg/dL — ABNORMAL HIGH (ref 70–99)

## 2019-07-08 LAB — CBC
HCT: 39.9 % (ref 39.0–52.0)
Hemoglobin: 13.7 g/dL (ref 13.0–17.0)
MCH: 31.1 pg (ref 26.0–34.0)
MCHC: 34.3 g/dL (ref 30.0–36.0)
MCV: 90.5 fL (ref 80.0–100.0)
Platelets: 147 10*3/uL — ABNORMAL LOW (ref 150–400)
RBC: 4.41 MIL/uL (ref 4.22–5.81)
RDW: 14.6 % (ref 11.5–15.5)
WBC: 6.1 10*3/uL (ref 4.0–10.5)
nRBC: 0 % (ref 0.0–0.2)

## 2019-07-08 LAB — BASIC METABOLIC PANEL
Anion gap: 14 (ref 5–15)
BUN: 6 mg/dL — ABNORMAL LOW (ref 8–23)
CO2: 21 mmol/L — ABNORMAL LOW (ref 22–32)
Calcium: 7.9 mg/dL — ABNORMAL LOW (ref 8.9–10.3)
Chloride: 105 mmol/L (ref 98–111)
Creatinine, Ser: 0.84 mg/dL (ref 0.61–1.24)
GFR calc Af Amer: >60 mL/min (ref >60)
GFR calc non Af Amer: >60 mL/min (ref >60)
Glucose, Bld: 155 mg/dL — ABNORMAL HIGH (ref 70–99)
Potassium: 3.1 mmol/L — ABNORMAL LOW (ref 3.5–5.1)
Sodium: 140 mmol/L (ref 135–145)

## 2019-07-08 MED ORDER — SODIUM BICARBONATE-DEXTROSE 150-5 MEQ/L-% IV SOLN
150.0000 meq | INTRAVENOUS | Status: DC
  Administered 2019-07-08: 150 meq via INTRAVENOUS
  Filled 2019-07-08 (×2): qty 1000

## 2019-07-08 MED ORDER — ENOXAPARIN SODIUM 100 MG/ML ~~LOC~~ SOLN
95.0000 mg | Freq: Two times a day (BID) | SUBCUTANEOUS | Status: DC
  Administered 2019-07-08 – 2019-07-09 (×3): 95 mg via SUBCUTANEOUS
  Filled 2019-07-08 (×3): qty 1

## 2019-07-08 MED ORDER — DOCUSATE SODIUM 100 MG PO CAPS
100.0000 mg | ORAL_CAPSULE | Freq: Two times a day (BID) | ORAL | Status: DC
  Administered 2019-07-08 – 2019-07-10 (×5): 100 mg via ORAL
  Filled 2019-07-08 (×5): qty 1

## 2019-07-08 MED ORDER — POTASSIUM CHLORIDE 10 MEQ/100ML IV SOLN
10.0000 meq | INTRAVENOUS | Status: AC
  Administered 2019-07-08 (×3): 10 meq via INTRAVENOUS
  Filled 2019-07-08 (×3): qty 100

## 2019-07-08 MED ORDER — POLYETHYLENE GLYCOL 3350 17 G PO PACK: 17.0000 g | PACK | Freq: Every day | ORAL | Status: DC | PRN

## 2019-07-08 NOTE — Progress Notes (Signed)
[facility] Progress Note     Subjective: CC-  No complaints today. Tolerated 24 hour clamping trial. Tolerating clear liquids. Denies abdominal pain, nausea, vomiting. Passing more flatus. No BM.  Objective: Vital signs in last 24 hours: Temp:  [97.7 F (36.5 C)-97.9 F (36.6 C)] 97.9 F (36.6 C) (09/10 0523) Pulse Rate:  [77-89] 84 (09/10 0523) Resp:  [17-18] 18 (09/10 0523) BP: (115-142)/(73-82) 130/82 (09/10 0523) SpO2:  [97 %-99 %] 99 % (09/10 0523) Last BM Date: 07/04/19  Intake/Output from previous day: 09/09 0701 - 09/10 0700 In: 1725 [I.V.:1725] Out: 250 [Emesis/NG output:250] Intake/Output this shift: No intake/output data recorded.  PE: Gen: Alert, NAD, pleasant HEENT: EOM's intact, pupils equal and round Pulm:Rate andeffort normal Abd: Soft,nontender, nondistended, +BS Skin: warm and dry   Lab Results:  Recent Labs    07/07/19 0524 07/08/19 0541  WBC 6.1 6.1  HGB 14.0 13.7  HCT 41.5 39.9  PLT 156 147*   BMET Recent Labs    07/07/19 0524 07/08/19 0541  NA 137 140  K 3.6 3.1*  CL 108 105  CO2 18* 21*  GLUCOSE 158* 155*  BUN 9 6*  CREATININE 0.89 0.84  CALCIUM 8.5* 7.9*   PT/INR No results for input(s): LABPROT, INR in the last 72 hours. CMP     Component Value Date/Time   NA 140 07/08/2019 0541   NA 140 06/02/2018 1001   K 3.1 (L) 07/08/2019 0541   CL 105 07/08/2019 0541   CO2 21 (L) 07/08/2019 0541   GLUCOSE 155 (H) 07/08/2019 0541   BUN 6 (L) 07/08/2019 0541   BUN 24 06/02/2018 1001   CREATININE 0.84 07/08/2019 0541   CREATININE 1.03 01/09/2016 1129   CALCIUM 7.9 (L) 07/08/2019 0541   PROT 6.9 07/04/2019 0218   PROT 6.2 06/02/2018 1001   ALBUMIN 3.6 07/04/2019 0218   ALBUMIN 3.9 06/02/2018 1001   AST 20 07/04/2019 0218   ALT 23 07/04/2019 0218   ALKPHOS 58 07/04/2019 0218   BILITOT 1.2 07/04/2019 0218   BILITOT 0.5 06/02/2018 1001   GFRNONAA >60 07/08/2019 0541   GFRAA >60 07/08/2019 0541   Lipase     Component Value Date/Time   LIPASE 28 12/21/2016 1847       Studies/Results: Dg Abd 2 Views  Result Date: 07/07/2019 CLINICAL DATA:  Follow-up small bowel obstruction EXAM: ABDOMEN - 2 VIEW COMPARISON:  None. FINDINGS: Oral contrast material is present in the ascending colon. Persistent gaseous distension of the small bowel and colon. There is no evidence of pneumoperitoneum, portal venous gas or pneumatosis. There are no pathologic calcifications along the expected course of the ureters. The osseous structures are unremarkable. IMPRESSION: Persistent gaseous distension of the small bowel and colon with transit of the oral contrast material into the ascending colon. This may reflect persistent partial small bowel obstruction versus an ileus. Electronically Signed   By: Kathreen Devoid   On: 07/07/2019 11:24   Dg Abd 2 Views  Result Date: 07/06/2019 CLINICAL DATA:  Abdominal distention with nausea and vomiting. Small bowel obstruction. EXAM: ABDOMEN - 2 VIEW COMPARISON:  CT scan dated 07/04/2019 and abdominal radiographs dated 07/05/2019 FINDINGS: NG tube tip is in the mid stomach. Slightly distended small bowel loops in the left mid abdomen persist. Contrast is present in the nondistended ascending colon. No free air.  No acute bone abnormality. IMPRESSION: Persistent dilated loops of small bowel in the left mid abdomen consistent with partial small bowel obstruction. Electronically Signed  By: Lorriane Shire M.D.   On: 07/06/2019 13:14   Dg Abd Portable 1v  Result Date: 07/08/2019 CLINICAL DATA:  Small bowel obstruction.  Clinically improving. EXAM: PORTABLE ABDOMEN - 1 VIEW COMPARISON:  07/07/2019 and 07/04/2019 FINDINGS: Nasogastric tube is present with tip over the stomach in the left upper quadrant. Bowel gas pattern demonstrates air and contrast throughout the colon. There is persistence of several air-filled dilated small bowel loops in the left abdomen with significant decreased number of  small bowel loops. Remainder of the exam is unchanged. IMPRESSION: Interval improvement with less number of dilated air-filled small bowel loops. Air and contrast throughout the colon. Likely improving partial small bowel obstruction. Electronically Signed   By: Marin Olp M.D.   On: 07/08/2019 08:26    Anti-infectives: Anti-infectives (From admission, onward)   None       Assessment/Plan Paroxysmal atrial fibrillation - hold xarelto DM Poly-neuropathy  HTN CHF CAD s/p LAD stenting H/o AAA repair 2009  SBO - likely partial SBO due to adhesions - XR shows improving partial SBO - passing more gas and tolerating clears - D/c NG tube and advance to full liquids. Ok to advance to soft diet later today if tolerating fulls.   ID -none FEN -IVF, FLD VTE -SCDs, ok for lovenox or heparin from surgical standpoint Foley -none Follow up -TBD   LOS: 3 days    [name] , [facility] 07/08/2019, 10:05 AM Pager: [phone] Mon-Thurs 7:00 am-4:30 pm Fri 7:00 am -11:30 AM Sat-Sun 7:00 am-11:30 am

## 2019-07-08 NOTE — Care Management Important Message (Signed)
Important Message  Patient Details IM Letter given to Sharren Bridge SW to present to the Patient Name: [name] MRN: [id number] Date of Birth: 1941/02/28   Medicare Important Message Given:  Yes     Kerin Salen 07/08/2019, 10:07 AM

## 2019-07-08 NOTE — Progress Notes (Signed)
[place] for lovenox Indication: hx atrial fibrillation (PTA xarelto on hold)  Allergies  Allergen Reactions  . Cymbalta [Duloxetine Hcl] Nausea And Vomiting and Other (See Comments)    PATIENT STATED THAT HE FELT LIKE HE "WAS GOING TO DIE" Rapid drop in blood pressure; sent him to the ER X2  . Neurontin [Gabapentin] Other (See Comments)    "Makes me goofy, keeps me off balance, clouds my thinking.."  . Keppra [Levetiracetam] Nausea And Vomiting    Patient Measurements: Height: 6\' 1"  (185.4 cm) Weight: 207 lb 3.7 oz (94 kg) IBW/kg (Calculated) : 79.9 Heparin Dosing Weight:   Vital Signs: Temp: 97.9 F (36.6 C) (09/10 0523) Temp Source: Oral (09/10 0523) BP: 130/82 (09/10 0523) Pulse Rate: 84 (09/10 0523)  Labs: Recent Labs    07/06/19 0455 07/07/19 0524 07/08/19 0541  HGB 13.9 14.0 13.7  HCT 43.1 41.5 39.9  PLT 157 156 147*  CREATININE 0.84 0.89 0.84    Estimated Creatinine Clearance: 79.3 mL/min (by C-G formula based on SCr of 0.84 mg/dL).   Medications:  - on xarelto 20 mg daily PTA (last dose taken on 9/4)  Assessment: Patient's an 78 y.o M with hx afib on xarelto PTA, presented to the ED on 07/04/19 with c/o dehydration, dizziness and distended abdomen. Abd x-ray showed findings consistent with distal small bowel obstruction. Xarelto was held on admission d/t NPO status and in case surgical intervention is needed.  Planning for med management of SBO at this time.  Pharmacy is consulted to dose full dose lovenox on 07/07/19 while off xarelto.  Goal of Therapy:  Anti-Xa level 0.6-1 units/ml 4hrs after LMWH dose given Monitor platelets by anticoagulation protocol: Yes   Plan:  - Adjust lovenox to 95 mg SQ q12h for weight - cbc q72 hrs - monitor for s/s bleeding   Zionah Criswell P 07/08/2019,11:40 AM

## 2019-07-08 NOTE — Progress Notes (Signed)
Patient ID: [name], male   DOB: 11-Jun-1941, 78 y.o.   MRN: [id number]                                                                PROGRESS NOTE                                                                                                                                                                                                             Patient Demographics:    [name], is a 78 y.o. male, DOB - [birth year], EYC:[id number]  Admit date - 07/04/2019   Admitting Physician Neena Rhymes, MD  Outpatient Primary MD for the patient is [name], MD  LOS - 3  Outpatient Specialists:     Chief Complaint  Patient presents with  . Dehydration  . Near Syncope       Brief Narrative    78 year old gentleman history of mild congestive heart failure, diabetes, A. fib on chronic anticoagulation, post open repair of abdominal aortic aneurysm in 2009, incisional hernia repair presenting to the ED with a 3-day history of abdominal distention, nausea and vomiting. Patient also noted to be orthostatic with presyncopal episodes prior to admission. Abdominal films done consistent with distal small bowel obstruction.  General surgery consulted.  Small bowel protocol ordered.  NG tube placed.  Anticoagulation on hold.  CT abdomen and pelvis ordered consistent with partial small bowel obstruction.  MRI head pending.  Patient placed on IV fluids, supportive care.   Subjective:    [name] today has, passed gas this am.  Pt has not had anything come out from NGT so this was clamped.  Pt has been afebrile. Denies n/v, abd pain, diarrhea, brbpr.    No headache, No chest pain,  No new weakness tingling or numbness, No Cough - SOB.    Assessment  & Plan :    Principal Problem:   Small bowel obstruction (HCC) Active Problems:   DM (diabetes mellitus), type 2, uncontrolled w/neurologic complication (HCC)   Paroxysmal atrial fibrillation with RVR (HCC)   Chronic combined systolic and  diastolic heart failure (HCC)   Near syncope   Postural dizziness with presyncope   Postural dizziness with near syncope   Abdominal distention, non-gaseous   Change in mental status  Acute metabolic encephalopathy   Orthostatic dizziness    1 partial small bowel obstruction Likely secondary to adhesions.  Small bowel protocol consistent with small bowel obstruction.  CT abdomen and pelvis consistent with partial small bowel obstruction.  Patient with no flatus or bowel movement.  NG tube in place.  Abdominal films with persistent dilated loops of bowels.  NG tube output 300 cc over the past 24 hours.  NG tube has been clamped and patient on ice chips and sips of clears from floor stock.  Abdominal films with no significant improvement. Pt passing gas. General surgery following and appreciate input and recommendations   2.  Near syncope likely secondary to orthostasis Patient on admission, near syncopal episode likely secondary to volume depletion.  Patient noted to be orthostatic on admission.  Patient with no signs or symptoms of infection.  Improving with hydration.  Continue IV fluids. Follow.  3.  Paroxysmal atrial fibrillation Currently rate controlled.  Continue amiodarone.  Continue IV Lopressor while patient n.p.o.  Oral anticoagulation on hold.  Place on full dose Lovenox for now.  Follow.    4.  Acute metabolic encephalopathy Per admitting physician was noted that patient's wife and stated that patient had some increased confusion, delusion, hallucination and mild disorientation.  Patient alert oriented to self place and time.  Improved.  CT head negative.  MRI brain negative.  IV fluids.  Supportive care.  5.  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  CBG of 138 this morning.  Patient currently n.p.o.  Continue to hold oral hypoglycemic agents.  Continue Lyrica.  Lidocaine ointment TID prn.  Sliding scale insulin.  6.  Hypertension Blood  pressure was borderline on admission.  Improving with hydration.  Patient currently n.p.o. due to small bowel obstructions.  On IV Lopressor.  Follow.   7.  Chronic combined systolic and diastolic CHF/coronary artery disease status post LAD stenting Stable.  ACE inhibitor on hold due to borderline blood pressure.  Patient on IV Lopressor.  Outpatient follow-up with cardiology.  Monitor closely for volume overload.  8. Hypokalemia Replete this am Check cmp in am   DVT prophylaxis: Lovenox Code Status: Full Family Communication: Updated patient.  No family at bedside.  Disposition Plan: Home once small bowel obstruction is resolved when okay with general surgery.   Consultants:   General surgery: Dr. Harlow Asa 07/04/2019  Procedures:   MRI head 07/05/2019  CT abdomen and pelvis 07/04/2019  CT head 07/04/2019  Small bowel protocol  Abdominal films 07/04/2019, 07/05/2019  Chest x-ray 07/05/2019  Antimicrobials:   None      Lab Results  Component Value Date   PLT 156 07/07/2019       Anti-infectives (From admission, onward)   None        Objective:   Vitals:   07/07/19 0621 07/07/19 1417 07/07/19 2102 07/08/19 0523  BP: 130/67 (!) 142/73 115/78 130/82  Pulse: 83 77 89 84  Resp: 16 17 18 18   Temp: 97.6 F (36.4 C) 97.8 F (36.6 C) 97.7 F (36.5 C) 97.9 F (36.6 C)  TempSrc:   Oral Oral  SpO2: 97% 97% 99% 99%  Weight:      Height:        Wt Readings from Last 3 Encounters:  07/04/19 94 kg  07/16/18 96.7 kg  06/02/18 92.7 kg     Intake/Output Summary (Last 24 hours) at 07/08/2019 0558 Last data filed at 07/07/2019 1504 Gross per 24 hour  Intake 1782.5 ml  Output 550 ml  Net 1232.5 ml     Physical Exam  Awake Alert, Oriented X 3, No new F.N deficits, Normal affect Isabel.AT,PERRAL Supple Neck,No JVD, No cervical lymphadenopathy appriciated.  Symmetrical Chest wall movement, Good air movement bilaterally, CTAB RRR,No Gallops,Rubs or new Murmurs, No  Parasternal Heave +ve B.Sounds, Abd Soft, No tenderness, No organomegaly appriciated, No rebound - guarding or rigidity. No Cyanosis, Clubbing or edema, No new Rash or bruise   NGT in place   Data Review:    CBC Recent Labs  Lab 07/04/19 0218 07/05/19 0655 07/06/19 0455 07/07/19 0524  WBC 11.0* 6.5 6.7 6.1  HGB 14.8 13.2 13.9 14.0  HCT 42.5 40.0 43.1 41.5  PLT 175 148* 157 156  MCV 90.6 94.3 95.1 92.4  MCH 31.6 31.1 30.7 31.2  MCHC 34.8 33.0 32.3 33.7  RDW 14.2 14.4 14.5 14.6  LYMPHSABS 0.8 1.1 1.2  --   MONOABS 2.9* 1.4* 1.1*  --   EOSABS 0.0 0.0 0.0  --   BASOSABS 0.0 0.0 0.1  --     Chemistries  Recent Labs  Lab 07/04/19 0218 07/04/19 1017 07/05/19 0655 07/06/19 0455 07/07/19 0524  NA 131* 136 134* 135 137  K 4.2 4.2 3.6 3.8 3.6  CL 95* 98 106 110 108  CO2 18* 23 16* 14* 18*  GLUCOSE 206* 151* 79 93 158*  BUN 50* 46* 24* 14 9  CREATININE 1.21 1.11 0.79 0.84 0.89  CALCIUM 8.7* 8.6* 7.9* 7.9* 8.5*  MG  --  2.2 1.9 2.1  --   AST 20  --   --   --   --   ALT 23  --   --   --   --   ALKPHOS 58  --   --   --   --   BILITOT 1.2  --   --   --   --    ------------------------------------------------------------------------------------------------------------------ No results for input(s): CHOL, HDL, LDLCALC, TRIG, CHOLHDL, LDLDIRECT in the last 72 hours.  Lab Results  Component Value Date   HGBA1C 7.3 (H) 07/04/2019   ------------------------------------------------------------------------------------------------------------------ No results for input(s): TSH, T4TOTAL, T3FREE, THYROIDAB in the last 72 hours.  Invalid input(s): FREET3 ------------------------------------------------------------------------------------------------------------------ No results for input(s): VITAMINB12, FOLATE, FERRITIN, TIBC, IRON, RETICCTPCT in the last 72 hours.  Coagulation profile Recent Labs  Lab 07/04/19 0257  INR 3.9*    No results for input(s): DDIMER in the last  72 hours.  Cardiac Enzymes No results for input(s): CKMB, TROPONINI, MYOGLOBIN in the last 168 hours.  Invalid input(s): CK ------------------------------------------------------------------------------------------------------------------    Component Value Date/Time   BNP 222.0 (H) 07/05/2019 6644    Inpatient Medications  Scheduled Meds: . amiodarone  200 mg Oral BID  . amitriptyline  25 mg Oral QHS  . enoxaparin (LOVENOX) injection  75 mg Subcutaneous Q12H  . insulin aspart  0-9 Units Subcutaneous Q4H  . metoprolol tartrate  5 mg Intravenous Q8H  . pantoprazole (PROTONIX) IV  40 mg Intravenous Q24H  . pregabalin  300 mg Oral Daily  . thiamine injection  100 mg Intravenous Daily   Continuous Infusions: . sodium bicarbonate 150 mEq in dextrose 5% 1000 mL 150 mEq (07/07/19 1720)   PRN Meds:.acetaminophen **OR** acetaminophen, diazepam, lidocaine, morphine injection, oxyCODONE-acetaminophen  Micro Results Recent Results (from the past 240 hour(s))  SARS CORONAVIRUS 2 (TAT 6-24 HRS) Nasopharyngeal Nasopharyngeal Swab     Status: None   Collection Time: 07/04/19  5:14 AM  Specimen: Nasopharyngeal Swab  Result Value Ref Range Status   SARS Coronavirus 2 NEGATIVE NEGATIVE Final    Comment: (NOTE) SARS-CoV-2 target nucleic acids are NOT DETECTED. The SARS-CoV-2 RNA is generally detectable in upper and lower respiratory specimens during the acute phase of infection. Negative results do not preclude SARS-CoV-2 infection, do not rule out co-infections with other pathogens, and should not be used as the sole basis for treatment or other patient management decisions. Negative results must be combined with clinical observations, patient history, and epidemiological information. The expected result is Negative. Fact Sheet for Patients: SugarRoll.be Fact Sheet for Healthcare Providers: https://www.woods-mathews.com/ This test is not yet  approved or cleared by the [place] FDA and  has been authorized for detection and/or diagnosis of SARS-CoV-2 by FDA under an Emergency Use Authorization (EUA). This EUA will remain  in effect (meaning this test can be used) for the duration of the COVID-19 declaration under Section 56 4(b)(1) of the Act, 21 U.S.C. section 360bbb-3(b)(1), unless the authorization is terminated or revoked sooner. Performed at [facility], [place] [address] [place], [place] [address]     Radiology Reports Dg Chest 2 View  Result Date: 07/05/2019 CLINICAL DATA:  78 year old male admitted to the medical service with 3-day history of abdominal discomfort, abdominal distention, nausea and vomiting. Eval for CHF. Patient has a complex past medical history. EXAM: CHEST - 2 VIEW COMPARISON:  04/28/2018 and earlier FINDINGS: Heart size is normal. There is atherosclerotic calcification of the thoracic aorta. Trace bilateral pleural effusions. No focal consolidations. Opacity at the RIGHT lung apex overlies the RIGHT first rib. A separate indistinct opacity is identified in the [place] lobe. IMPRESSION: 1. Question of RIGHT upper lobe nodules. 2. Further evaluation with chest CT is recommended. 3. Trace bilateral pleural effusions. Electronically Signed   By: Nolon Nations M.D.   On: 07/05/2019 09:51   Dg Abdomen 1 View  Result Date: 07/04/2019 CLINICAL DATA:  Encounter for NG tube replacement. EXAM: ABDOMEN - 1 VIEW COMPARISON:  Abdominal radiograph 07/04/2019 at 7:09 a.m. FINDINGS: Interval placement of a nasogastric tube with side port projecting beyond the GE junction over the stomach. There are several dilated air-filled loops of small bowel in the mid abdomen raising concern for obstruction. Surgical clips noted in the right upper quadrant. Lumbar spine hardware intact. IMPRESSION: 1. Nasogastric tube side port projects beyond the GE junction over the stomach. 2. Redemonstrated dilated air-filled  loops of small bowel in the mid abdomen raising concern for obstruction. Electronically Signed   By: Audie Pinto M.D.   On: 07/04/2019 17:55   Dg Abd 1 View  Result Date: 07/04/2019 CLINICAL DATA:  Abdominal distention for 3 days. Anorexia. EXAM: ABDOMEN - 1 VIEW COMPARISON:  04/26/2016 FINDINGS: Moderately dilated small bowel loops are seen in the lower abdomen, with paucity of colonic gas. These findings are suspicious for a distal small bowel obstruction. Right upper quadrant surgical clips from prior cholecystectomy, and lumbar spine fusion hardware noted. Probable tiny adjacent less than 5 mm left renal calculi again noted. No definite radiopaque calculi seen along the course of the ureters or bladder. IMPRESSION: Findings suspicious for distal small bowel obstruction. Probable tiny nonobstructing left renal calculi. Electronically Signed   By: Marlaine Hind M.D.   On: 07/04/2019 07:44   Ct Head Wo Contrast  Result Date: 07/04/2019 CLINICAL DATA:  Initial evaluation for acute ataxia, stroke suspected. EXAM: CT HEAD WITHOUT CONTRAST TECHNIQUE: Contiguous axial images were obtained  from the base of the skull through the vertex without intravenous contrast. COMPARISON:  Prior CT from 06/02/2018. FINDINGS: Brain: Generalized age-related cerebral atrophy with mild chronic small vessel ischemic disease. No acute intracranial hemorrhage. No acute large vessel territory infarct. No mass lesion, midline shift or mass effect. No hydrocephalus. No extra-axial fluid collection. Vascular: No hyperdense vessel. Scattered vascular calcifications noted within the carotid siphons. Skull: Scalp soft tissues and calvarium within normal limits. Sinuses/Orbits: Globes and orbital soft tissues demonstrate no acute finding. Trace layering opacity noted within the right sphenoid sinus. Paranasal sinuses are otherwise clear. No mastoid effusion. Other: Noted. IMPRESSION: 1. No acute intracranial abnormality. 2. Generalized  age-related cerebral atrophy with mild chronic small vessel ischemic disease. Electronically Signed   By: Jeannine Boga M.D.   On: 07/04/2019 04:05   Mr Jeri Cos GH Contrast  Result Date: 07/05/2019 CLINICAL DATA:  TIA. Progressive loss of balance and mental status change. Loss of taste. EXAM: MRI HEAD WITHOUT AND WITH CONTRAST TECHNIQUE: Multiplanar, multiecho pulse sequences of the brain and surrounding structures were obtained without and with intravenous contrast. CONTRAST:  10 ML Gadavist COMPARISON:  CT head without contrast 07/04/2019 and 06/02/2018 FINDINGS: Brain: Periventricular white matter changes are mildly advanced for age. Diffusion-weighted images demonstrate no acute or subacute infarction. No acute hemorrhage or mass lesion is present. The ventricles are of normal size. The study is mildly degraded by patient motion. No significant extraaxial fluid collection is present. The internal auditory canals are within normal limits. The brainstem and cerebellum are within normal limits. Vascular: Flow is present in the major intracranial arteries. Skull and upper cervical spine: The craniocervical junction is normal. Upper cervical spine is within normal limits. Marrow signal is unremarkable. Sinuses/Orbits: The paranasal sinuses and mastoid air cells are clear. The globes and orbits are within normal limits. IMPRESSION: 1. No acute or subacute infarction. 2. No acute intracranial abnormality. 3. Periventricular white matter changes are mildly advanced for age. This likely reflects the sequela of chronic microvascular ischemia. Electronically Signed   By: San Morelle M.D.   On: 07/05/2019 16:13   Ct Abdomen Pelvis W Contrast  Result Date: 07/04/2019 CLINICAL DATA:  78 year old male with history of abdominal distention, nausea vomiting. Concern for bowel obstruction. EXAM: CT ABDOMEN AND PELVIS WITH CONTRAST TECHNIQUE: Multidetector CT imaging of the abdomen and pelvis was performed  using the standard protocol following bolus administration of intravenous contrast. CONTRAST:  38mL OMNIPAQUE IOHEXOL 300 MG/ML SOLN, 168mL OMNIPAQUE IOHEXOL 300 MG/ML SOLN COMPARISON:  CT of the abdomen pelvis dated 04/29/2018 FINDINGS: Lower chest: Partially visualized small bilateral pleural effusions and associated partial compressive atelectasis of the lower lobes. There is mild cardiomegaly. Coronary vascular calcifications noted. No intra-abdominal free air or free fluid. Hepatobiliary: The liver is unremarkable. Mild intrahepatic biliary ductal dilatation. Cholecystectomy. No retained calcified stone noted in the central CBD. Pancreas: Unremarkable. No pancreatic ductal dilatation or surrounding inflammatory changes. Spleen: Normal in size without focal abnormality. Adrenals/Urinary Tract: The adrenal glands are unremarkable. Mild left renal atrophy with cortical scarring primarily involving the inferior pole. Two adjacent left renal inferior pole calculi each measuring approximately 4 mm. There is no hydronephrosis on the left. There is mild right hydronephrosis. No obstructing calculi identified. Multiple right renal cysts noted similar to prior CT. There is symmetric enhancement and excretion of contrast by both kidneys. The urinary bladder is distended. Stomach/Bowel: An enteric tube is noted with tip in the proximal stomach. Mildly dilated loops of contrast filled proximal  small bowel measure up to 3.8 cm in caliber. There is apparent mild thickening of a short segment of small bowel the left lower abdomen which may be related to underdistention. Segmental enteritis is less likely. Clinical correlation is recommended. The distal small bowel and terminal ileum demonstrate a normal caliber. There is an area of tethering of the adjacent bowel loops in the lower abdomen (series 2, image 51 and 45) which may represent areas of adhesion. No discrete transition identified. The appendix is normal.  Vascular/Lymphatic: Advanced aortoiliac atherosclerotic disease. 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 No significant change since the prior CT. The IVC is unremarkable. No portal venous gas. There is no adenopathy. Reproductive: The prostate and seminal vesicles are grossly unremarkable. No pelvic mass. Other: Midline vertical anterior pelvic wall incisional scar. Musculoskeletal: Degenerative changes of the spine. Lower lumbar posterior fusion. No acute osseous pathology. IMPRESSION: 1. Mildly dilated proximal small bowel loops may represent ileus. An early or low grade obstruction is less likely but not excluded. Clinical correlation and follow-up with serial KUB recommended to document passage of oral contrast into the colon. 2. Small bilateral pleural effusions with associated partial compressive atelectasis of the lower lobes. 3. No significant interval change in the 3.4 cm infrarenal abdominal aortic aneurysm status post prior repair. 4. Aortic Atherosclerosis (ICD10-I70.0). Electronically Signed   By: Anner Crete M.D.   On: 07/04/2019 23:57   Dg Abd 2 Views  Result Date: 07/07/2019 CLINICAL DATA:  Follow-up small bowel obstruction EXAM: ABDOMEN - 2 VIEW COMPARISON:  None. FINDINGS: Oral contrast material is present in the ascending colon. Persistent gaseous distension of the small bowel and colon. There is no evidence of pneumoperitoneum, portal venous gas or pneumatosis. There are no pathologic calcifications along the expected course of the ureters. The osseous structures are unremarkable. IMPRESSION: Persistent gaseous distension of the small bowel and colon with transit of the oral contrast material into the ascending colon. This may reflect persistent partial small bowel obstruction versus an ileus. Electronically Signed   By: Kathreen Devoid   On: 07/07/2019 11:24   Dg Abd 2 Views  Result Date: 07/06/2019 CLINICAL DATA:  Abdominal distention with nausea and  vomiting. Small bowel obstruction. EXAM: ABDOMEN - 2 VIEW COMPARISON:  CT scan dated 07/04/2019 and abdominal radiographs dated 07/05/2019 FINDINGS: NG tube tip is in the mid stomach. Slightly distended small bowel loops in the left mid abdomen persist. Contrast is present in the nondistended ascending colon. No free air.  No acute bone abnormality. IMPRESSION: Persistent dilated loops of small bowel in the left mid abdomen consistent with partial small bowel obstruction. Electronically Signed   By: Lorriane Shire M.D.   On: 07/06/2019 13:14   Dg Abd Portable 1v-small Bowel Obstruction Protocol-initial, 8 Hr Delay  Result Date: 07/05/2019 CLINICAL DATA:  8 Hour Delay image for Small Bowel protocol 78 year old male admitted to the medical service with 3-day history of abdominal discomfort, abdominal distention, nausea and vomiting. EXAM: PORTABLE ABDOMEN - 1 VIEW COMPARISON:  CT of the abdomen and pelvis on 07/04/2019 FINDINGS: There is persistent dilatation of small bowel loops, measuring up to 3.9 centimeters in the central abdomen. Contrast is identified within nondilated loops of colon following recent CT exam. Nasogastric tube overlies the stomach. Contrast is identified within the urinary bladder following CT exam. IMPRESSION: Similar small bowel dilatation. Presence of contrast in the colon excludes complete obstruction. Electronically Signed   By: Nolon Nations M.D.  On: 07/05/2019 09:46    Time Spent in minutes  30   Jani Gravel M.D on 07/08/2019 at 5:58 AM  Between 7am to 7pm - Pager - [phone]    After 7pm go to [email or web address] - password [email or web address]  [facility] -  Office  [phone]

## 2019-07-09 LAB — COMPREHENSIVE METABOLIC PANEL
ALT: 19 U/L (ref 0–44)
AST: 16 U/L (ref 15–41)
Albumin: 2.8 g/dL — ABNORMAL LOW (ref 3.5–5.0)
Alkaline Phosphatase: 44 U/L (ref 38–126)
Anion gap: 7 (ref 5–15)
BUN: 7 mg/dL — ABNORMAL LOW (ref 8–23)
CO2: 29 mmol/L (ref 22–32)
Calcium: 8.5 mg/dL — ABNORMAL LOW (ref 8.9–10.3)
Chloride: 103 mmol/L (ref 98–111)
Creatinine, Ser: 0.67 mg/dL (ref 0.61–1.24)
GFR calc Af Amer: >60 mL/min (ref >60)
GFR calc non Af Amer: >60 mL/min (ref >60)
Glucose, Bld: 147 mg/dL — ABNORMAL HIGH (ref 70–99)
Potassium: 2.9 mmol/L — ABNORMAL LOW (ref 3.5–5.1)
Sodium: 139 mmol/L (ref 135–145)
Total Bilirubin: 0.9 mg/dL (ref 0.3–1.2)
Total Protein: 5.4 g/dL — ABNORMAL LOW (ref 6.5–8.1)

## 2019-07-09 LAB — CBC
HCT: 35.5 % — ABNORMAL LOW (ref 39.0–52.0)
Hemoglobin: 12.2 g/dL — ABNORMAL LOW (ref 13.0–17.0)
MCH: 30.9 pg (ref 26.0–34.0)
MCHC: 34.4 g/dL (ref 30.0–36.0)
MCV: 89.9 fL (ref 80.0–100.0)
Platelets: 150 10*3/uL (ref 150–400)
RBC: 3.95 MIL/uL — ABNORMAL LOW (ref 4.22–5.81)
RDW: 14.4 % (ref 11.5–15.5)
WBC: 4.5 10*3/uL (ref 4.0–10.5)
nRBC: 0 % (ref 0.0–0.2)

## 2019-07-09 LAB — GLUCOSE, CAPILLARY
Glucose-Capillary: 145 mg/dL — ABNORMAL HIGH (ref 70–99)
Glucose-Capillary: 151 mg/dL — ABNORMAL HIGH (ref 70–99)
Glucose-Capillary: 157 mg/dL — ABNORMAL HIGH (ref 70–99)
Glucose-Capillary: 158 mg/dL — ABNORMAL HIGH (ref 70–99)
Glucose-Capillary: 175 mg/dL — ABNORMAL HIGH (ref 70–99)
Glucose-Capillary: 213 mg/dL — ABNORMAL HIGH (ref 70–99)

## 2019-07-09 MED ORDER — POTASSIUM CHLORIDE CRYS ER 20 MEQ PO TBCR
40.0000 meq | EXTENDED_RELEASE_TABLET | Freq: Two times a day (BID) | ORAL | Status: AC
  Administered 2019-07-09 (×2): 40 meq via ORAL
  Filled 2019-07-09 (×2): qty 2

## 2019-07-09 MED ORDER — PRO-STAT SUGAR FREE PO LIQD
30.0000 mL | Freq: Two times a day (BID) | ORAL | Status: DC
  Administered 2019-07-09 – 2019-07-10 (×2): 30 mL via ORAL
  Filled 2019-07-09 (×2): qty 30

## 2019-07-09 MED ORDER — POLYETHYLENE GLYCOL 3350 17 G PO PACK: 17.0000 g | PACK | Freq: Every day | ORAL | 0 refills | Status: DC | PRN

## 2019-07-09 MED ORDER — DOCUSATE SODIUM 100 MG PO CAPS: 100.0000 mg | ORAL_CAPSULE | Freq: Two times a day (BID) | ORAL | 0 refills | Status: DC

## 2019-07-09 MED ORDER — BISACODYL 10 MG RE SUPP
10.0000 mg | Freq: Every day | RECTAL | Status: DC | PRN
  Administered 2019-07-09: 10 mg via RECTAL
  Filled 2019-07-09: qty 1

## 2019-07-09 NOTE — Progress Notes (Signed)
Patient ID: [name], male   DOB: 08-17-1941, 78 y.o.   MRN: [id number]                                                                PROGRESS NOTE                                                                                                                                                                                                             Patient Demographics:    [name], is a 78 y.o. male, DOB - 1941-06-06, YWV:[id number]  Admit date - 07/04/2019   Admitting Physician Neena Rhymes, MD  Outpatient Primary MD for the patient is [name], MD  LOS - 4  Outpatient Specialists:     Chief Complaint  Patient presents with  . Dehydration  . Near Syncope       Brief Narrative  78 year old gentleman history of mild congestive heart failure, diabetes, A. fib on chronic anticoagulation, post open repair of abdominal aortic aneurysm in 2009, incisional hernia repair presenting to the ED with a 3-day history of abdominal distention, nausea and vomiting. Patient also noted to be orthostatic with presyncopal episodes prior to admission. Abdominal films done consistent with distal small bowel obstruction. General surgery consulted. Small bowel protocol ordered. NG tube placed. Anticoagulation on hold. CT abdomen and pelvis ordered consistent with partial small bowel obstruction. MRI head pending. Patient placed on IV fluids, supportive care.   Subjective:    [name] today has not yet had BM,  Awaiting BM as criteria for discharge per surgery.  Pt denies n/v, abd pain, diarrhea, brbpr, afebrile.  Pt is tolerating regular diet at present time.    No headache, No chest pain, No new weakness tingling or numbness, No Cough - SOB.    Assessment  & Plan :    Principal Problem:   Small bowel obstruction (HCC) Active Problems:   DM (diabetes mellitus), type 2, uncontrolled w/neurologic complication (HCC)   Paroxysmal atrial fibrillation with RVR (HCC)   Chronic combined  systolic and diastolic heart failure (HCC)   Near syncope   Postural dizziness with presyncope   Postural dizziness with near syncope   Abdominal distention, non-gaseous   Change in mental status   Acute metabolic encephalopathy   Orthostatic dizziness  Hypokalemia   Partial small bowel obstruction Likely secondary to adhesions. Small bowel protocol consistent with small bowel obstruction. CT abdomen and pelvis consistent with partial small bowel obstruction. Patient with no flatus or bowel movement. NG tube in place. Abdominal films with persistent dilated loops of bowels. NG tube output 300cc over the past 24 hours. NG tube has been clamped and patient on ice chips and sips of clears from floor stock. Abdominal films with no significant improvement. Pt passing gas. General surgery following and appreciate input and recommendations   Near syncope likely secondary to orthostasis Patient on admission, near syncopal episode likely secondary to volume depletion. Patient noted to be orthostatic on admission. Patient with no signs or symptoms of infection. Improving with hydration. Continue IV fluids. Follow.  Paroxysmal atrial fibrillation Currently rate controlled. Continue amiodarone. Continue IV Lopressor while patient n.p.o. Oral anticoagulation on hold. Place on full dose Lovenox for now. Follow.   Acute metabolic encephalopathy resolved Per admitting physician was noted that patient's wife and stated that patient had some increased confusion, delusion, hallucination and mild disorientation. Patient alert oriented to self place and time. Improved. CT head negative. MRI brain negative. IV fluids. Supportive care.  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 CBG of 138 this morning. Patient currently n.p.o. Continue to hold oral hypoglycemic agents. Continue Lyrica. Lidocaine ointment TID prn. Sliding scale  insulin.  Hypertension Blood pressurewas borderline on admission. Improving with hydration. Patient currently n.p.o. due to small bowel obstructions. On IV Lopressor. Follow.   Chronic combined systolic and diastolic CHF/coronary artery disease status post LAD stenting Stable. ACE inhibitor on hold due to borderline blood pressure. Patient on IV Lopressor. Outpatient follow-up with cardiology. Monitor closely for volume overload.  Hypokalemia Replete this am Check cmp in am   DVT prophylaxis:Lovenox Code Status:Full Family Communication:Updated patient. No family at bedside.  Disposition Plan:Home once small bowel obstruction is resolved when okay with general surgery.   Consultants:  General surgery: Dr. Harlow Asa 07/04/2019  Procedures:  MRI head 07/05/2019  CT abdomen and pelvis 07/04/2019  CT head 07/04/2019  Small bowel protocol  Abdominal films 07/04/2019, 07/05/2019  Chest x-ray 07/05/2019  Antimicrobials:   None      Lab Results  Component Value Date   PLT 150 07/09/2019       Anti-infectives (From admission, onward)   None        Objective:   Vitals:   07/08/19 1336 07/08/19 1935 07/09/19 0501 07/09/19 1343  BP: 120/69 117/73 134/73 135/72  Pulse: 80 91 79 73  Resp: 17 18 18 20   Temp: 97.6 F (36.4 C) 98.9 F (37.2 C) 98.2 F (36.8 C) (!) 97.4 F (36.3 C)  TempSrc:  Oral    SpO2: 94% 92% 95% 99%  Weight:      Height:        Wt Readings from Last 3 Encounters:  07/04/19 94 kg  07/16/18 96.7 kg  06/02/18 92.7 kg     Intake/Output Summary (Last 24 hours) at 07/09/2019 1544 Last data filed at 07/09/2019 1230 Gross per 24 hour  Intake 960 ml  Output -  Net 960 ml     Physical Exam  Awake Alert, Oriented X 3, No new F.N deficits, Normal affect [place].AT,PERRAL Supple Neck,No JVD, No cervical lymphadenopathy appriciated.  Symmetrical Chest wall movement, Good air movement bilaterally, CTAB RRR,No Gallops,Rubs or new  Murmurs, No Parasternal Heave +ve B.Sounds, Abd Soft, No tenderness, No organomegaly appriciated, No rebound -  guarding or rigidity. No Cyanosis, Clubbing or edema, No new Rash or bruise     Data Review:    CBC Recent Labs  Lab 07/04/19 0218 07/05/19 0655 07/06/19 0455 07/07/19 0524 07/08/19 0541 07/09/19 0525  WBC 11.0* 6.5 6.7 6.1 6.1 4.5  HGB 14.8 13.2 13.9 14.0 13.7 12.2*  HCT 42.5 40.0 43.1 41.5 39.9 35.5*  PLT 175 148* 157 156 147* 150  MCV 90.6 94.3 95.1 92.4 90.5 89.9  MCH 31.6 31.1 30.7 31.2 31.1 30.9  MCHC 34.8 33.0 32.3 33.7 34.3 34.4  RDW 14.2 14.4 14.5 14.6 14.6 14.4  LYMPHSABS 0.8 1.1 1.2  --   --   --   MONOABS 2.9* 1.4* 1.1*  --   --   --   EOSABS 0.0 0.0 0.0  --   --   --   BASOSABS 0.0 0.0 0.1  --   --   --     Chemistries  Recent Labs  Lab 07/04/19 0218 07/04/19 1017 07/05/19 0655 07/06/19 0455 07/07/19 0524 07/08/19 0541 07/09/19 0525  NA 131* 136 134* 135 137 140 139  K 4.2 4.2 3.6 3.8 3.6 3.1* 2.9*  CL 95* 98 106 110 108 105 103  CO2 18* 23 16* 14* 18* 21* 29  GLUCOSE 206* 151* 79 93 158* 155* 147*  BUN 50* 46* 24* 14 9 6* 7*  CREATININE 1.21 1.11 0.79 0.84 0.89 0.84 0.67  CALCIUM 8.7* 8.6* 7.9* 7.9* 8.5* 7.9* 8.5*  MG  --  2.2 1.9 2.1  --   --   --   AST 20  --   --   --   --   --  16  ALT 23  --   --   --   --   --  19  ALKPHOS 58  --   --   --   --   --  44  BILITOT 1.2  --   --   --   --   --  0.9   ------------------------------------------------------------------------------------------------------------------ No results for input(s): CHOL, HDL, LDLCALC, TRIG, CHOLHDL, LDLDIRECT in the last 72 hours.  Lab Results  Component Value Date   HGBA1C 7.3 (H) 07/04/2019   ------------------------------------------------------------------------------------------------------------------ No results for input(s): TSH, T4TOTAL, T3FREE, THYROIDAB in the last 72 hours.  Invalid input(s):  FREET3 ------------------------------------------------------------------------------------------------------------------ No results for input(s): VITAMINB12, FOLATE, FERRITIN, TIBC, IRON, RETICCTPCT in the last 72 hours.  Coagulation profile Recent Labs  Lab 07/04/19 0257  INR 3.9*    No results for input(s): DDIMER in the last 72 hours.  Cardiac Enzymes No results for input(s): CKMB, TROPONINI, MYOGLOBIN in the last 168 hours.  Invalid input(s): CK ------------------------------------------------------------------------------------------------------------------    Component Value Date/Time   BNP 222.0 (H) 07/05/2019 1610    Inpatient Medications  Scheduled Meds: . amiodarone  200 mg Oral BID  . amitriptyline  25 mg Oral QHS  . docusate sodium  100 mg Oral BID  . enoxaparin (LOVENOX) injection  95 mg Subcutaneous Q12H  . insulin aspart  0-9 Units Subcutaneous Q4H  . metoprolol tartrate  5 mg Intravenous Q8H  . pantoprazole (PROTONIX) IV  40 mg Intravenous Q24H  . potassium chloride  40 mEq Oral BID  . pregabalin  300 mg Oral Daily  . thiamine injection  100 mg Intravenous Daily   Continuous Infusions: PRN Meds:.acetaminophen **OR** acetaminophen, bisacodyl, diazepam, lidocaine, morphine injection, oxyCODONE-acetaminophen, polyethylene glycol  Micro Results Recent Results (from the past 240 hour(s))  SARS CORONAVIRUS 2 (TAT  6-24 HRS) Nasopharyngeal Nasopharyngeal Swab     Status: None   Collection Time: 07/04/19  5:14 AM   Specimen: Nasopharyngeal Swab  Result Value Ref Range Status   SARS Coronavirus 2 NEGATIVE NEGATIVE Final    Comment: (NOTE) SARS-CoV-2 target nucleic acids are NOT DETECTED. The SARS-CoV-2 RNA is generally detectable in upper and lower respiratory specimens during the acute phase of infection. Negative results do not preclude SARS-CoV-2 infection, do not rule out co-infections with other pathogens, and should not be used as the sole basis for  treatment or other patient management decisions. Negative results must be combined with clinical observations, patient history, and epidemiological information. The expected result is Negative. Fact Sheet for Patients: SugarRoll.be Fact Sheet for Healthcare Providers: https://www.woods-mathews.com/ This test is not yet approved or cleared by the [place] FDA and  has been authorized for detection and/or diagnosis of SARS-CoV-2 by FDA under an Emergency Use Authorization (EUA). This EUA will remain  in effect (meaning this test can be used) for the duration of the COVID-19 declaration under Section 56 4(b)(1) of the Act, 21 U.S.C. section 360bbb-3(b)(1), unless the authorization is terminated or revoked sooner. Performed at [facility], [place] [address] [place], [place] [address]     Radiology Reports Dg Chest 2 View  Result Date: 07/05/2019 CLINICAL DATA:  78 year old male admitted to the medical service with 3-day history of abdominal discomfort, abdominal distention, nausea and vomiting. Eval for CHF. Patient has a complex past medical history. EXAM: CHEST - 2 VIEW COMPARISON:  04/28/2018 and earlier FINDINGS: Heart size is normal. There is atherosclerotic calcification of the thoracic aorta. Trace bilateral pleural effusions. No focal consolidations. Opacity at the RIGHT lung apex overlies the RIGHT first rib. A separate indistinct opacity is identified in the [place] lobe. IMPRESSION: 1. Question of RIGHT upper lobe nodules. 2. Further evaluation with chest CT is recommended. 3. Trace bilateral pleural effusions. Electronically Signed   By: Nolon Nations M.D.   On: 07/05/2019 09:51   Dg Abdomen 1 View  Result Date: 07/04/2019 CLINICAL DATA:  Encounter for NG tube replacement. EXAM: ABDOMEN - 1 VIEW COMPARISON:  Abdominal radiograph 07/04/2019 at 7:09 a.m. FINDINGS: Interval placement of a nasogastric tube with side port  projecting beyond the GE junction over the stomach. There are several dilated air-filled loops of small bowel in the mid abdomen raising concern for obstruction. Surgical clips noted in the right upper quadrant. Lumbar spine hardware intact. IMPRESSION: 1. Nasogastric tube side port projects beyond the GE junction over the stomach. 2. Redemonstrated dilated air-filled loops of small bowel in the mid abdomen raising concern for obstruction. Electronically Signed   By: Audie Pinto M.D.   On: 07/04/2019 17:55   Dg Abd 1 View  Result Date: 07/04/2019 CLINICAL DATA:  Abdominal distention for 3 days. Anorexia. EXAM: ABDOMEN - 1 VIEW COMPARISON:  04/26/2016 FINDINGS: Moderately dilated small bowel loops are seen in the lower abdomen, with paucity of colonic gas. These findings are suspicious for a distal small bowel obstruction. Right upper quadrant surgical clips from prior cholecystectomy, and lumbar spine fusion hardware noted. Probable tiny adjacent less than 5 mm left renal calculi again noted. No definite radiopaque calculi seen along the course of the ureters or bladder. IMPRESSION: Findings suspicious for distal small bowel obstruction. Probable tiny nonobstructing left renal calculi. Electronically Signed   By: Marlaine Hind M.D.   On: 07/04/2019 07:44   Ct Head Wo Contrast  Result Date: 07/04/2019  CLINICAL DATA:  Initial evaluation for acute ataxia, stroke suspected. EXAM: CT HEAD WITHOUT CONTRAST TECHNIQUE: Contiguous axial images were obtained from the base of the skull through the vertex without intravenous contrast. COMPARISON:  Prior CT from 06/02/2018. FINDINGS: Brain: Generalized age-related cerebral atrophy with mild chronic small vessel ischemic disease. No acute intracranial hemorrhage. No acute large vessel territory infarct. No mass lesion, midline shift or mass effect. No hydrocephalus. No extra-axial fluid collection. Vascular: No hyperdense vessel. Scattered vascular calcifications noted  within the carotid siphons. Skull: Scalp soft tissues and calvarium within normal limits. Sinuses/Orbits: Globes and orbital soft tissues demonstrate no acute finding. Trace layering opacity noted within the right sphenoid sinus. Paranasal sinuses are otherwise clear. No mastoid effusion. Other: Noted. IMPRESSION: 1. No acute intracranial abnormality. 2. Generalized age-related cerebral atrophy with mild chronic small vessel ischemic disease. Electronically Signed   By: Jeannine Boga M.D.   On: 07/04/2019 04:05   Mr Jeri Cos LK Contrast  Result Date: 07/05/2019 CLINICAL DATA:  TIA. Progressive loss of balance and mental status change. Loss of taste. EXAM: MRI HEAD WITHOUT AND WITH CONTRAST TECHNIQUE: Multiplanar, multiecho pulse sequences of the brain and surrounding structures were obtained without and with intravenous contrast. CONTRAST:  10 ML Gadavist COMPARISON:  CT head without contrast 07/04/2019 and 06/02/2018 FINDINGS: Brain: Periventricular white matter changes are mildly advanced for age. Diffusion-weighted images demonstrate no acute or subacute infarction. No acute hemorrhage or mass lesion is present. The ventricles are of normal size. The study is mildly degraded by patient motion. No significant extraaxial fluid collection is present. The internal auditory canals are within normal limits. The brainstem and cerebellum are within normal limits. Vascular: Flow is present in the major intracranial arteries. Skull and upper cervical spine: The craniocervical junction is normal. Upper cervical spine is within normal limits. Marrow signal is unremarkable. Sinuses/Orbits: The paranasal sinuses and mastoid air cells are clear. The globes and orbits are within normal limits. IMPRESSION: 1. No acute or subacute infarction. 2. No acute intracranial abnormality. 3. Periventricular white matter changes are mildly advanced for age. This likely reflects the sequela of chronic microvascular ischemia.  Electronically Signed   By: San Morelle M.D.   On: 07/05/2019 16:13   Ct Abdomen Pelvis W Contrast  Result Date: 07/04/2019 CLINICAL DATA:  78 year old male with history of abdominal distention, nausea vomiting. Concern for bowel obstruction. EXAM: CT ABDOMEN AND PELVIS WITH CONTRAST TECHNIQUE: Multidetector CT imaging of the abdomen and pelvis was performed using the standard protocol following bolus administration of intravenous contrast. CONTRAST:  74mL OMNIPAQUE IOHEXOL 300 MG/ML SOLN, 158mL OMNIPAQUE IOHEXOL 300 MG/ML SOLN COMPARISON:  CT of the abdomen pelvis dated 04/29/2018 FINDINGS: Lower chest: Partially visualized small bilateral pleural effusions and associated partial compressive atelectasis of the lower lobes. There is mild cardiomegaly. Coronary vascular calcifications noted. No intra-abdominal free air or free fluid. Hepatobiliary: The liver is unremarkable. Mild intrahepatic biliary ductal dilatation. Cholecystectomy. No retained calcified stone noted in the central CBD. Pancreas: Unremarkable. No pancreatic ductal dilatation or surrounding inflammatory changes. Spleen: Normal in size without focal abnormality. Adrenals/Urinary Tract: The adrenal glands are unremarkable. Mild left renal atrophy with cortical scarring primarily involving the inferior pole. Two adjacent left renal inferior pole calculi each measuring approximately 4 mm. There is no hydronephrosis on the left. There is mild right hydronephrosis. No obstructing calculi identified. Multiple right renal cysts noted similar to prior CT. There is symmetric enhancement and excretion of contrast by both kidneys. The urinary bladder  is distended. Stomach/Bowel: An enteric tube is noted with tip in the proximal stomach. Mildly dilated loops of contrast filled proximal small bowel measure up to 3.8 cm in caliber. There is apparent mild thickening of a short segment of small bowel the left lower abdomen which may be related to  underdistention. Segmental enteritis is less likely. Clinical correlation is recommended. The distal small bowel and terminal ileum demonstrate a normal caliber. There is an area of tethering of the adjacent bowel loops in the lower abdomen (series 2, image 51 and 45) which may represent areas of adhesion. No discrete transition identified. The appendix is normal. Vascular/Lymphatic: Advanced aortoiliac atherosclerotic disease. 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 No significant change since the prior CT. The IVC is unremarkable. No portal venous gas. There is no adenopathy. Reproductive: The prostate and seminal vesicles are grossly unremarkable. No pelvic mass. Other: Midline vertical anterior pelvic wall incisional scar. Musculoskeletal: Degenerative changes of the spine. Lower lumbar posterior fusion. No acute osseous pathology. IMPRESSION: 1. Mildly dilated proximal small bowel loops may represent ileus. An early or low grade obstruction is less likely but not excluded. Clinical correlation and follow-up with serial KUB recommended to document passage of oral contrast into the colon. 2. Small bilateral pleural effusions with associated partial compressive atelectasis of the lower lobes. 3. No significant interval change in the 3.4 cm infrarenal abdominal aortic aneurysm status post prior repair. 4. Aortic Atherosclerosis (ICD10-I70.0). Electronically Signed   By: Anner Crete M.D.   On: 07/04/2019 23:57   Dg Abd 2 Views  Result Date: 07/07/2019 CLINICAL DATA:  Follow-up small bowel obstruction EXAM: ABDOMEN - 2 VIEW COMPARISON:  None. FINDINGS: Oral contrast material is present in the ascending colon. Persistent gaseous distension of the small bowel and colon. There is no evidence of pneumoperitoneum, portal venous gas or pneumatosis. There are no pathologic calcifications along the expected course of the ureters. The osseous structures are unremarkable. IMPRESSION:  Persistent gaseous distension of the small bowel and colon with transit of the oral contrast material into the ascending colon. This may reflect persistent partial small bowel obstruction versus an ileus. Electronically Signed   By: Kathreen Devoid   On: 07/07/2019 11:24   Dg Abd 2 Views  Result Date: 07/06/2019 CLINICAL DATA:  Abdominal distention with nausea and vomiting. Small bowel obstruction. EXAM: ABDOMEN - 2 VIEW COMPARISON:  CT scan dated 07/04/2019 and abdominal radiographs dated 07/05/2019 FINDINGS: NG tube tip is in the mid stomach. Slightly distended small bowel loops in the left mid abdomen persist. Contrast is present in the nondistended ascending colon. No free air.  No acute bone abnormality. IMPRESSION: Persistent dilated loops of small bowel in the left mid abdomen consistent with partial small bowel obstruction. Electronically Signed   By: Lorriane Shire M.D.   On: 07/06/2019 13:14   Dg Abd Portable 1v  Result Date: 07/08/2019 CLINICAL DATA:  Small bowel obstruction.  Clinically improving. EXAM: PORTABLE ABDOMEN - 1 VIEW COMPARISON:  07/07/2019 and 07/04/2019 FINDINGS: Nasogastric tube is present with tip over the stomach in the left upper quadrant. Bowel gas pattern demonstrates air and contrast throughout the colon. There is persistence of several air-filled dilated small bowel loops in the left abdomen with significant decreased number of small bowel loops. Remainder of the exam is unchanged. IMPRESSION: Interval improvement with less number of dilated air-filled small bowel loops. Air and contrast throughout the colon. Likely improving partial small bowel obstruction. Electronically Signed  By: Marin Olp M.D.   On: 07/08/2019 08:26   Dg Abd Portable 1v-small Bowel Obstruction Protocol-initial, 8 Hr Delay  Result Date: 07/05/2019 CLINICAL DATA:  8 Hour Delay image for Small Bowel protocol 78 year old male admitted to the medical service with 3-day history of abdominal discomfort,  abdominal distention, nausea and vomiting. EXAM: PORTABLE ABDOMEN - 1 VIEW COMPARISON:  CT of the abdomen and pelvis on 07/04/2019 FINDINGS: There is persistent dilatation of small bowel loops, measuring up to 3.9 centimeters in the central abdomen. Contrast is identified within nondilated loops of colon following recent CT exam. Nasogastric tube overlies the stomach. Contrast is identified within the urinary bladder following CT exam. IMPRESSION: Similar small bowel dilatation. Presence of contrast in the colon excludes complete obstruction. Electronically Signed   By: Nolon Nations M.D.   On: 07/05/2019 09:46    Time Spent in minutes  30   Jani Gravel M.D on 07/09/2019 at 3:44 PM  Between 7am to 7pm - Pager - [phone]   After 7pm go to [email or web address] - password [facility]  [facility] -  Office  [phone]

## 2019-07-09 NOTE — Progress Notes (Signed)
[facility] Progress Note     Subjective: CC-  Up in chair. No abdominal complaints. Tolerating soft diet. Passing flatus. No BM.  Objective: Vital signs in last 24 hours: Temp:  [97.6 F (36.4 C)-98.9 F (37.2 C)] 98.2 F (36.8 C) (09/11 0501) Pulse Rate:  [79-91] 79 (09/11 0501) Resp:  [17-18] 18 (09/11 0501) BP: (117-134)/(69-73) 134/73 (09/11 0501) SpO2:  [92 %-95 %] 95 % (09/11 0501) Last BM Date: 07/04/19  Intake/Output from previous day: 09/10 0701 - 09/11 0700 In: 5573 [P.O.:840; I.V.:365; IV Piggyback:300] Out: -  Intake/Output this shift: No intake/output data recorded.  PE: Gen: Alert, NAD, pleasant HEENT: EOM's intact, pupils equal and round Pulm:Rate andeffort normal, CTAB Abd: Soft,nontender, nondistended, +BS Skin: warm and dry  Lab Results:  Recent Labs    07/08/19 0541 07/09/19 0525  WBC 6.1 4.5  HGB 13.7 12.2*  HCT 39.9 35.5*  PLT 147* 150   BMET Recent Labs    07/08/19 0541 07/09/19 0525  NA 140 139  K 3.1* 2.9*  CL 105 103  CO2 21* 29  GLUCOSE 155* 147*  BUN 6* 7*  CREATININE 0.84 0.67  CALCIUM 7.9* 8.5*   PT/INR No results for input(s): LABPROT, INR in the last 72 hours. CMP     Component Value Date/Time   NA 139 07/09/2019 0525   NA 140 06/02/2018 1001   K 2.9 (L) 07/09/2019 0525   CL 103 07/09/2019 0525   CO2 29 07/09/2019 0525   GLUCOSE 147 (H) 07/09/2019 0525   BUN 7 (L) 07/09/2019 0525   BUN 24 06/02/2018 1001   CREATININE 0.67 07/09/2019 0525   CREATININE 1.03 01/09/2016 1129   CALCIUM 8.5 (L) 07/09/2019 0525   PROT 5.4 (L) 07/09/2019 0525   PROT 6.2 06/02/2018 1001   ALBUMIN 2.8 (L) 07/09/2019 0525   ALBUMIN 3.9 06/02/2018 1001   AST 16 07/09/2019 0525   ALT 19 07/09/2019 0525   ALKPHOS 44 07/09/2019 0525   BILITOT 0.9 07/09/2019 0525   BILITOT 0.5 06/02/2018 1001   GFRNONAA >60 07/09/2019 0525   GFRAA >60 07/09/2019 0525   Lipase     Component Value Date/Time   LIPASE 28  12/21/2016 1847       Studies/Results: Dg Abd Portable 1v  Result Date: 07/08/2019 CLINICAL DATA:  Small bowel obstruction.  Clinically improving. EXAM: PORTABLE ABDOMEN - 1 VIEW COMPARISON:  07/07/2019 and 07/04/2019 FINDINGS: Nasogastric tube is present with tip over the stomach in the left upper quadrant. Bowel gas pattern demonstrates air and contrast throughout the colon. There is persistence of several air-filled dilated small bowel loops in the left abdomen with significant decreased number of small bowel loops. Remainder of the exam is unchanged. IMPRESSION: Interval improvement with less number of dilated air-filled small bowel loops. Air and contrast throughout the colon. Likely improving partial small bowel obstruction. Electronically Signed   By: Marin Olp M.D.   On: 07/08/2019 08:26    Anti-infectives: Anti-infectives (From admission, onward)   None       Assessment/Plan Paroxysmal atrial fibrillation - hold xarelto DM Poly-neuropathy  HTN CHF CAD s/p LAD stenting H/o AAA repair 2009  SBO - likely partial SBO due to adhesions - XR shows improving partial SBO - passing flatus, no BM - Continue soft diet. Mobilize more. Ok to discharge later today if he has a bowel movement. Dulcolax suppository PRN. No surgical follow up needed.  ID -none FEN -IVF, soft diet VTE -SCDs, ok for lovenox  or heparin from surgical standpoint Foley -none Follow up- PCP   LOS: 4 days    Wellington Hampshire , [facility] 07/09/2019, 11:39 AM Pager: [phone] Mon-Thurs 7:00 am-4:30 pm Fri 7:00 am -11:30 AM Sat-Sun 7:00 am-11:30 am

## 2019-07-10 LAB — COMPREHENSIVE METABOLIC PANEL
ALT: 18 U/L (ref 0–44)
AST: 16 U/L (ref 15–41)
Albumin: 2.9 g/dL — ABNORMAL LOW (ref 3.5–5.0)
Alkaline Phosphatase: 53 U/L (ref 38–126)
Anion gap: 8 (ref 5–15)
BUN: 10 mg/dL (ref 8–23)
CO2: 28 mmol/L (ref 22–32)
Calcium: 8.7 mg/dL — ABNORMAL LOW (ref 8.9–10.3)
Chloride: 104 mmol/L (ref 98–111)
Creatinine, Ser: 0.67 mg/dL (ref 0.61–1.24)
GFR calc Af Amer: >60 mL/min (ref >60)
GFR calc non Af Amer: >60 mL/min (ref >60)
Glucose, Bld: 186 mg/dL — ABNORMAL HIGH (ref 70–99)
Potassium: 3.9 mmol/L (ref 3.5–5.1)
Sodium: 140 mmol/L (ref 135–145)
Total Bilirubin: 0.5 mg/dL (ref 0.3–1.2)
Total Protein: 5.7 g/dL — ABNORMAL LOW (ref 6.5–8.1)

## 2019-07-10 LAB — GLUCOSE, CAPILLARY
Glucose-Capillary: 154 mg/dL — ABNORMAL HIGH (ref 70–99)
Glucose-Capillary: 259 mg/dL — ABNORMAL HIGH (ref 70–99)

## 2019-07-10 NOTE — Discharge Summary (Signed)
Physician Discharge Summary   Patient ID: [name] MRN: [id number] DOB/AGE: 01-22-1941 78 y.o.  Admit date: 07/04/2019 Discharge date: 07/10/2019  Primary Care Physician:  [name], MD   Recommendations for Outpatient Follow-up:  1. Follow up with PCP in 1-2 weeks  Home Health: Patient ambulatory at his baseline Equipment/Devices:   Discharge Condition: stable  CODE STATUS: FULL Diet recommendation: Carb modified diet   Discharge Diagnoses:     Partial small bowel obstruction due to adhesions Near syncope secondary to orthostasis Acute metabolic encephalopathy: Resolved . DM (diabetes mellitus), type 2, uncontrolled w/neurologic complication ([place]) . Paroxysmal atrial fibrillation with RVR ([place]) . Chronic combined systolic and diastolic heart failure ([place]) . Near syncope . Abdominal distention, non-gaseous . CAD History of AAA repair 2009 Hypokalemia  Consults: General surgery    Allergies:   Allergies  Allergen Reactions  . Cymbalta [Duloxetine Hcl] Nausea And Vomiting and Other (See Comments)    PATIENT STATED THAT HE FELT LIKE HE "WAS GOING TO DIE" Rapid drop in blood pressure; sent him to the ER X2  . Neurontin [Gabapentin] Other (See Comments)    "Makes me goofy, keeps me off balance, clouds my thinking.."  . Keppra [Levetiracetam] Nausea And Vomiting     DISCHARGE MEDICATIONS: Allergies as of 07/10/2019      Reactions   Cymbalta [duloxetine Hcl] Nausea And Vomiting, Other (See Comments)   PATIENT STATED THAT HE FELT LIKE HE "WAS GOING TO DIE" Rapid drop in blood pressure; sent him to the ER X2   Neurontin [gabapentin] Other (See Comments)   "Makes me goofy, keeps me off balance, clouds my thinking.."   Keppra [levetiracetam] Nausea And Vomiting      Medication List    TAKE these medications   amiodarone 200 MG tablet Commonly known as: PACERONE Take 1 tablet by mouth once daily What changed: when to take this   amitriptyline 25 MG  tablet Commonly known as: ELAVIL Take 25 mg by mouth at bedtime.   atorvastatin 10 MG tablet Commonly known as: LIPITOR Take 10 mg by mouth daily.   Co Q 10 100 MG Caps Take 100 mg by mouth daily.   diazepam 5 MG tablet Commonly known as: VALIUM Take 5 mg by mouth at bedtime as needed (leg cramps/sleep).   docusate sodium 100 MG capsule Commonly known as: COLACE Take 1 capsule (100 mg total) by mouth 2 (two) times daily.   glimepiride 1 MG tablet Commonly known as: AMARYL Take 1 mg by mouth daily with breakfast.   Jardiance 10 MG Tabs tablet Generic drug: empagliflozin Take 10 mg by mouth daily with breakfast.   metFORMIN 500 MG 24 hr tablet Commonly known as: GLUCOPHAGE-XR Take 500-1,000 mg by mouth See admin instructions. 1,000 mg in the morning and 500 mg in the evening   metoprolol tartrate 50 MG tablet Commonly known as: LOPRESSOR Take 1 tablet by mouth twice daily   omeprazole 20 MG capsule Commonly known as: PRILOSEC Take 20 mg by mouth daily.   One-A-Day Mens 50+ Advantage Tabs Take 1 tablet by mouth daily.   oxyCODONE-acetaminophen 5-325 MG tablet Commonly known as: PERCOCET/ROXICET Take 1 tablet by mouth every 6 (six) hours as needed (pain).   polyethylene glycol 17 g packet Commonly known as: MIRALAX / GLYCOLAX Take 17 g by mouth daily as needed for moderate constipation.   pregabalin 300 MG capsule Commonly known as: LYRICA Take 1 capsule by mouth 2 (two) times a day.   ramipril  5 MG capsule Commonly known as: ALTACE Take 1 capsule (5 mg total) by mouth daily.   vitamin B-12 1000 MCG tablet Commonly known as: CYANOCOBALAMIN Take 1,000 mcg by mouth daily.   Xarelto 20 MG Tabs tablet Generic drug: rivaroxaban Take 20 mg by mouth daily with supper.        Brief H and P: For complete details please refer to admission H and P, but in brief 78 year old gentleman history of mild congestive heart failure, diabetes, A. fib on chronic  anticoagulation, post open repair of abdominal aortic aneurysm in 2009, incisional hernia repair presenting to the ED with a 3-day history of abdominal distention, nausea and vomiting. Patient also noted to be orthostatic with presyncopal episodes prior to admission. Abdominal films done consistent with distal small bowel obstruction. NG tube placed. CT abdomen and pelvis ordered consistent with partial small bowel obstruction.  Patient was admitted for further work-up   Hospital Course:   Partial small bowel obstruction -Resolved likely secondary to adhesions -CT abdomen pelvis was consistent with partial SBO, patient reported no flatus or BM. -He was placed on n.p.o. status, IV fluids, NGT to intermittent wall suction.  Small bowel obstruction protocol was followed with general surgery consult -Patient had a large BM yesterday, tolerating diet without any difficulty, ambulating -He has been cleared by general surgery for discharge home. -Patient was recommended Colace daily, MiraLAX as needed for constipation  Near syncope -Patient on admission was near syncopal due to volume depletion, vital signs showed orthostasis  -Currently improving, ambulating without any difficulty, no dizziness or lightheadedness -He was placed on IV fluid hydration.   Paroxysmal atrial fibrillation -Rate controlled, amiodarone was continued -Oral anticoagulation, Xarelto was on hold while patient was n.p.o. and was placed on full dose Lovenox -Currently improved, resume Xarelto   Acute metabolic encephalopathy -Per admitting physician, patient's wife had stated that patient was demonstrating some increased confusion before admission -Possibly due to #1, currently back to baseline, fully oriented -CT head was negative, MRI brain was negative for acute stroke  Diabetes mellitus type 2, with complications, severe polyneuropathy -Hemoglobin A1c 7.3, resume outpatient regimen, continue Lyrica -While  inpatient he was placed on sliding scale insulin  Hypertension -At the time of admission BP was borderline, orthostatic. -Now improved  Hypokalemia Was replaced  Day of Discharge S: No acute issues, no nausea or vomiting.  Had a large BM yesterday.  Back to baseline, fully oriented, ambulating.  Tolerating diet without any difficulty.  BP 137/75 (BP Location: Right Arm)   Pulse 76   Temp (!) 97.5 F (36.4 C) (Oral)   Resp 18   Ht 6\' 1"  (1.854 m)   Wt 94 kg   SpO2 96%   BMI 27.34 kg/m   Physical Exam: General: Alert and awake oriented x3 not in any acute distress. HEENT: anicteric sclera, pupils reactive to light and accommodation CVS: S1-S2 clear no murmur rubs or gallops Chest: clear to auscultation bilaterally, no wheezing rales or rhonchi Abdomen: soft nontender, nondistended, normal bowel sounds Extremities: no cyanosis, clubbing or edema noted bilaterally Neuro: Cranial nerves II-XII intact, no focal neurological deficits   The results of significant diagnostics from this hospitalization (including imaging, microbiology, ancillary and laboratory) are listed below for reference.      Procedures/Studies:  Dg Chest 2 View  Result Date: 07/05/2019 CLINICAL DATA:  78 year old male admitted to the medical service with 3-day history of abdominal discomfort, abdominal distention, nausea and vomiting. Eval for CHF. Patient has a  complex past medical history. EXAM: CHEST - 2 VIEW COMPARISON:  04/28/2018 and earlier FINDINGS: Heart size is normal. There is atherosclerotic calcification of the thoracic aorta. Trace bilateral pleural effusions. No focal consolidations. Opacity at the RIGHT lung apex overlies the RIGHT first rib. A separate indistinct opacity is identified in the [place] lobe. IMPRESSION: 1. Question of RIGHT upper lobe nodules. 2. Further evaluation with chest CT is recommended. 3. Trace bilateral pleural effusions. Electronically Signed   By: Nolon Nations M.D.   On: 07/05/2019 09:51   Dg Abdomen 1 View  Result Date: 07/04/2019 CLINICAL DATA:  Encounter for NG tube replacement. EXAM: ABDOMEN - 1 VIEW COMPARISON:  Abdominal radiograph 07/04/2019 at 7:09 a.m. FINDINGS: Interval placement of a nasogastric tube with side port projecting beyond the GE junction over the stomach. There are several dilated air-filled loops of small bowel in the mid abdomen raising concern for obstruction. Surgical clips noted in the right upper quadrant. Lumbar spine hardware intact. IMPRESSION: 1. Nasogastric tube side port projects beyond the GE junction over the stomach. 2. Redemonstrated dilated air-filled loops of small bowel in the mid abdomen raising concern for obstruction. Electronically Signed   By: Audie Pinto M.D.   On: 07/04/2019 17:55   Dg Abd 1 View  Result Date: 07/04/2019 CLINICAL DATA:  Abdominal distention for 3 days. Anorexia. EXAM: ABDOMEN - 1 VIEW COMPARISON:  04/26/2016 FINDINGS: Moderately dilated small bowel loops are seen in the lower abdomen, with paucity of colonic gas. These findings are suspicious for a distal small bowel obstruction. Right upper quadrant surgical clips from prior cholecystectomy, and lumbar spine fusion hardware noted. Probable tiny adjacent less than 5 mm left renal calculi again noted. No definite radiopaque calculi seen along the course of the ureters or bladder. IMPRESSION: Findings suspicious for distal small bowel obstruction. Probable tiny nonobstructing left renal calculi. Electronically Signed   By: Marlaine Hind M.D.   On: 07/04/2019 07:44   Ct Head Wo Contrast  Result Date: 07/04/2019 CLINICAL DATA:  Initial evaluation for acute ataxia, stroke suspected. EXAM: CT HEAD WITHOUT CONTRAST TECHNIQUE: Contiguous axial images were obtained from the base of the skull through the vertex without intravenous contrast. COMPARISON:  Prior CT from 06/02/2018. FINDINGS: Brain: Generalized age-related cerebral atrophy with mild  chronic small vessel ischemic disease. No acute intracranial hemorrhage. No acute large vessel territory infarct. No mass lesion, midline shift or mass effect. No hydrocephalus. No extra-axial fluid collection. Vascular: No hyperdense vessel. Scattered vascular calcifications noted within the carotid siphons. Skull: Scalp soft tissues and calvarium within normal limits. Sinuses/Orbits: Globes and orbital soft tissues demonstrate no acute finding. Trace layering opacity noted within the right sphenoid sinus. Paranasal sinuses are otherwise clear. No mastoid effusion. Other: Noted. IMPRESSION: 1. No acute intracranial abnormality. 2. Generalized age-related cerebral atrophy with mild chronic small vessel ischemic disease. Electronically Signed   By: Jeannine Boga M.D.   On: 07/04/2019 04:05   Mr Jeri Cos HQ Contrast  Result Date: 07/05/2019 CLINICAL DATA:  TIA. Progressive loss of balance and mental status change. Loss of taste. EXAM: MRI HEAD WITHOUT AND WITH CONTRAST TECHNIQUE: Multiplanar, multiecho pulse sequences of the brain and surrounding structures were obtained without and with intravenous contrast. CONTRAST:  10 ML Gadavist COMPARISON:  CT head without contrast 07/04/2019 and 06/02/2018 FINDINGS: Brain: Periventricular white matter changes are mildly advanced for age. Diffusion-weighted images demonstrate no acute or subacute infarction. No acute hemorrhage or mass lesion is present. The ventricles are  of normal size. The study is mildly degraded by patient motion. No significant extraaxial fluid collection is present. The internal auditory canals are within normal limits. The brainstem and cerebellum are within normal limits. Vascular: Flow is present in the major intracranial arteries. Skull and upper cervical spine: The craniocervical junction is normal. Upper cervical spine is within normal limits. Marrow signal is unremarkable. Sinuses/Orbits: The paranasal sinuses and mastoid air cells are  clear. The globes and orbits are within normal limits. IMPRESSION: 1. No acute or subacute infarction. 2. No acute intracranial abnormality. 3. Periventricular white matter changes are mildly advanced for age. This likely reflects the sequela of chronic microvascular ischemia. Electronically Signed   By: San Morelle M.D.   On: 07/05/2019 16:13   Ct Abdomen Pelvis W Contrast  Result Date: 07/04/2019 CLINICAL DATA:  78 year old male with history of abdominal distention, nausea vomiting. Concern for bowel obstruction. EXAM: CT ABDOMEN AND PELVIS WITH CONTRAST TECHNIQUE: Multidetector CT imaging of the abdomen and pelvis was performed using the standard protocol following bolus administration of intravenous contrast. CONTRAST:  69mL OMNIPAQUE IOHEXOL 300 MG/ML SOLN, 115mL OMNIPAQUE IOHEXOL 300 MG/ML SOLN COMPARISON:  CT of the abdomen pelvis dated 04/29/2018 FINDINGS: Lower chest: Partially visualized small bilateral pleural effusions and associated partial compressive atelectasis of the lower lobes. There is mild cardiomegaly. Coronary vascular calcifications noted. No intra-abdominal free air or free fluid. Hepatobiliary: The liver is unremarkable. Mild intrahepatic biliary ductal dilatation. Cholecystectomy. No retained calcified stone noted in the central CBD. Pancreas: Unremarkable. No pancreatic ductal dilatation or surrounding inflammatory changes. Spleen: Normal in size without focal abnormality. Adrenals/Urinary Tract: The adrenal glands are unremarkable. Mild left renal atrophy with cortical scarring primarily involving the inferior pole. Two adjacent left renal inferior pole calculi each measuring approximately 4 mm. There is no hydronephrosis on the left. There is mild right hydronephrosis. No obstructing calculi identified. Multiple right renal cysts noted similar to prior CT. There is symmetric enhancement and excretion of contrast by both kidneys. The urinary bladder is distended.  Stomach/Bowel: An enteric tube is noted with tip in the proximal stomach. Mildly dilated loops of contrast filled proximal small bowel measure up to 3.8 cm in caliber. There is apparent mild thickening of a short segment of small bowel the left lower abdomen which may be related to underdistention. Segmental enteritis is less likely. Clinical correlation is recommended. The distal small bowel and terminal ileum demonstrate a normal caliber. There is an area of tethering of the adjacent bowel loops in the lower abdomen (series 2, image 51 and 45) which may represent areas of adhesion. No discrete transition identified. The appendix is normal. Vascular/Lymphatic: Advanced aortoiliac atherosclerotic disease. 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 No significant change since the prior CT. The IVC is unremarkable. No portal venous gas. There is no adenopathy. Reproductive: The prostate and seminal vesicles are grossly unremarkable. No pelvic mass. Other: Midline vertical anterior pelvic wall incisional scar. Musculoskeletal: Degenerative changes of the spine. Lower lumbar posterior fusion. No acute osseous pathology. IMPRESSION: 1. Mildly dilated proximal small bowel loops may represent ileus. An early or low grade obstruction is less likely but not excluded. Clinical correlation and follow-up with serial KUB recommended to document passage of oral contrast into the colon. 2. Small bilateral pleural effusions with associated partial compressive atelectasis of the lower lobes. 3. No significant interval change in the 3.4 cm infrarenal abdominal aortic aneurysm status post prior repair. 4. Aortic Atherosclerosis (ICD10-I70.0). Electronically Signed  By: Anner Crete M.D.   On: 07/04/2019 23:57   Dg Abd 2 Views  Result Date: 07/07/2019 CLINICAL DATA:  Follow-up small bowel obstruction EXAM: ABDOMEN - 2 VIEW COMPARISON:  None. FINDINGS: Oral contrast material is present in the  ascending colon. Persistent gaseous distension of the small bowel and colon. There is no evidence of pneumoperitoneum, portal venous gas or pneumatosis. There are no pathologic calcifications along the expected course of the ureters. The osseous structures are unremarkable. IMPRESSION: Persistent gaseous distension of the small bowel and colon with transit of the oral contrast material into the ascending colon. This may reflect persistent partial small bowel obstruction versus an ileus. Electronically Signed   By: Kathreen Devoid   On: 07/07/2019 11:24   Dg Abd 2 Views  Result Date: 07/06/2019 CLINICAL DATA:  Abdominal distention with nausea and vomiting. Small bowel obstruction. EXAM: ABDOMEN - 2 VIEW COMPARISON:  CT scan dated 07/04/2019 and abdominal radiographs dated 07/05/2019 FINDINGS: NG tube tip is in the mid stomach. Slightly distended small bowel loops in the left mid abdomen persist. Contrast is present in the nondistended ascending colon. No free air.  No acute bone abnormality. IMPRESSION: Persistent dilated loops of small bowel in the left mid abdomen consistent with partial small bowel obstruction. Electronically Signed   By: Lorriane Shire M.D.   On: 07/06/2019 13:14   Dg Abd Portable 1v  Result Date: 07/08/2019 CLINICAL DATA:  Small bowel obstruction.  Clinically improving. EXAM: PORTABLE ABDOMEN - 1 VIEW COMPARISON:  07/07/2019 and 07/04/2019 FINDINGS: Nasogastric tube is present with tip over the stomach in the left upper quadrant. Bowel gas pattern demonstrates air and contrast throughout the colon. There is persistence of several air-filled dilated small bowel loops in the left abdomen with significant decreased number of small bowel loops. Remainder of the exam is unchanged. IMPRESSION: Interval improvement with less number of dilated air-filled small bowel loops. Air and contrast throughout the colon. Likely improving partial small bowel obstruction. Electronically Signed   By: Marin Olp M.D.   On: 07/08/2019 08:26   Dg Abd Portable 1v-small Bowel Obstruction Protocol-initial, 8 Hr Delay  Result Date: 07/05/2019 CLINICAL DATA:  8 Hour Delay image for Small Bowel protocol 78 year old male admitted to the medical service with 3-day history of abdominal discomfort, abdominal distention, nausea and vomiting. EXAM: PORTABLE ABDOMEN - 1 VIEW COMPARISON:  CT of the abdomen and pelvis on 07/04/2019 FINDINGS: There is persistent dilatation of small bowel loops, measuring up to 3.9 centimeters in the central abdomen. Contrast is identified within nondilated loops of colon following recent CT exam. Nasogastric tube overlies the stomach. Contrast is identified within the urinary bladder following CT exam. IMPRESSION: Similar small bowel dilatation. Presence of contrast in the colon excludes complete obstruction. Electronically Signed   By: Nolon Nations M.D.   On: 07/05/2019 09:46       LAB RESULTS: Basic Metabolic Panel: Recent Labs  Lab 07/06/19 0455  07/09/19 0525 07/10/19 0547  NA 135   < > 139 140  K 3.8   < > 2.9* 3.9  CL 110   < > 103 104  CO2 14*   < > 29 28  GLUCOSE 93   < > 147* 186*  BUN 14   < > 7* 10  CREATININE 0.84   < > 0.67 0.67  CALCIUM 7.9*   < > 8.5* 8.7*  MG 2.1  --   --   --    < > =  values in this interval not displayed.   Liver Function Tests: Recent Labs  Lab 07/09/19 0525 07/10/19 0547  AST 16 16  ALT 19 18  ALKPHOS 44 53  BILITOT 0.9 0.5  PROT 5.4* 5.7*  ALBUMIN 2.8* 2.9*   No results for input(s): LIPASE, AMYLASE in the last 168 hours. No results for input(s): AMMONIA in the last 168 hours. CBC: Recent Labs  Lab 07/06/19 0455  07/08/19 0541 07/09/19 0525  WBC 6.7   < > 6.1 4.5  NEUTROABS 3.8  --   --   --   HGB 13.9   < > 13.7 12.2*  HCT 43.1   < > 39.9 35.5*  MCV 95.1   < > 90.5 89.9  PLT 157   < > 147* 150   < > = values in this interval not displayed.   Cardiac Enzymes: No results for input(s): CKTOTAL, CKMB,  CKMBINDEX, TROPONINI in the last 168 hours. BNP: Invalid input(s): POCBNP CBG: Recent Labs  Lab 07/09/19 2047 07/10/19 0822  GLUCAP 157* 154*      Disposition and Follow-up: Discharge Instructions    Diet Carb Modified   Complete by: As directed    Increase activity slowly   Complete by: As directed        DISPOSITION: home    [place]    [name], MD Follow up in 2 week(s).   Specialty: Internal Medicine Contact information: [address] [place] [address] [phone]        Belva Crome, MD .   Specialty: Cardiology Contact information: [phone] [address] [place] [address] [phone]            Time coordinating discharge:  35 mins   Signed:   Estill Cotta M.D. [facility] 07/10/2019, 10:05 AM

## 2019-07-10 NOTE — Progress Notes (Signed)
Discharge instructions given to patient. Medications reviewed.  Patient is being discharged home with wife.

## 2019-09-11 IMAGING — DX DG CHEST 2V
2 series · 2 of 2 positions shown · non-contrast
Comparison: Chest x-ray of June 11, 2017

CLINICAL DATA: Shortness of breath, dizziness, and mild chest pain
today. History of CHF, atrial fibrillation, coronary artery disease,
former smoker.

EXAM:
CHEST - 2 VIEW

[chest lat]
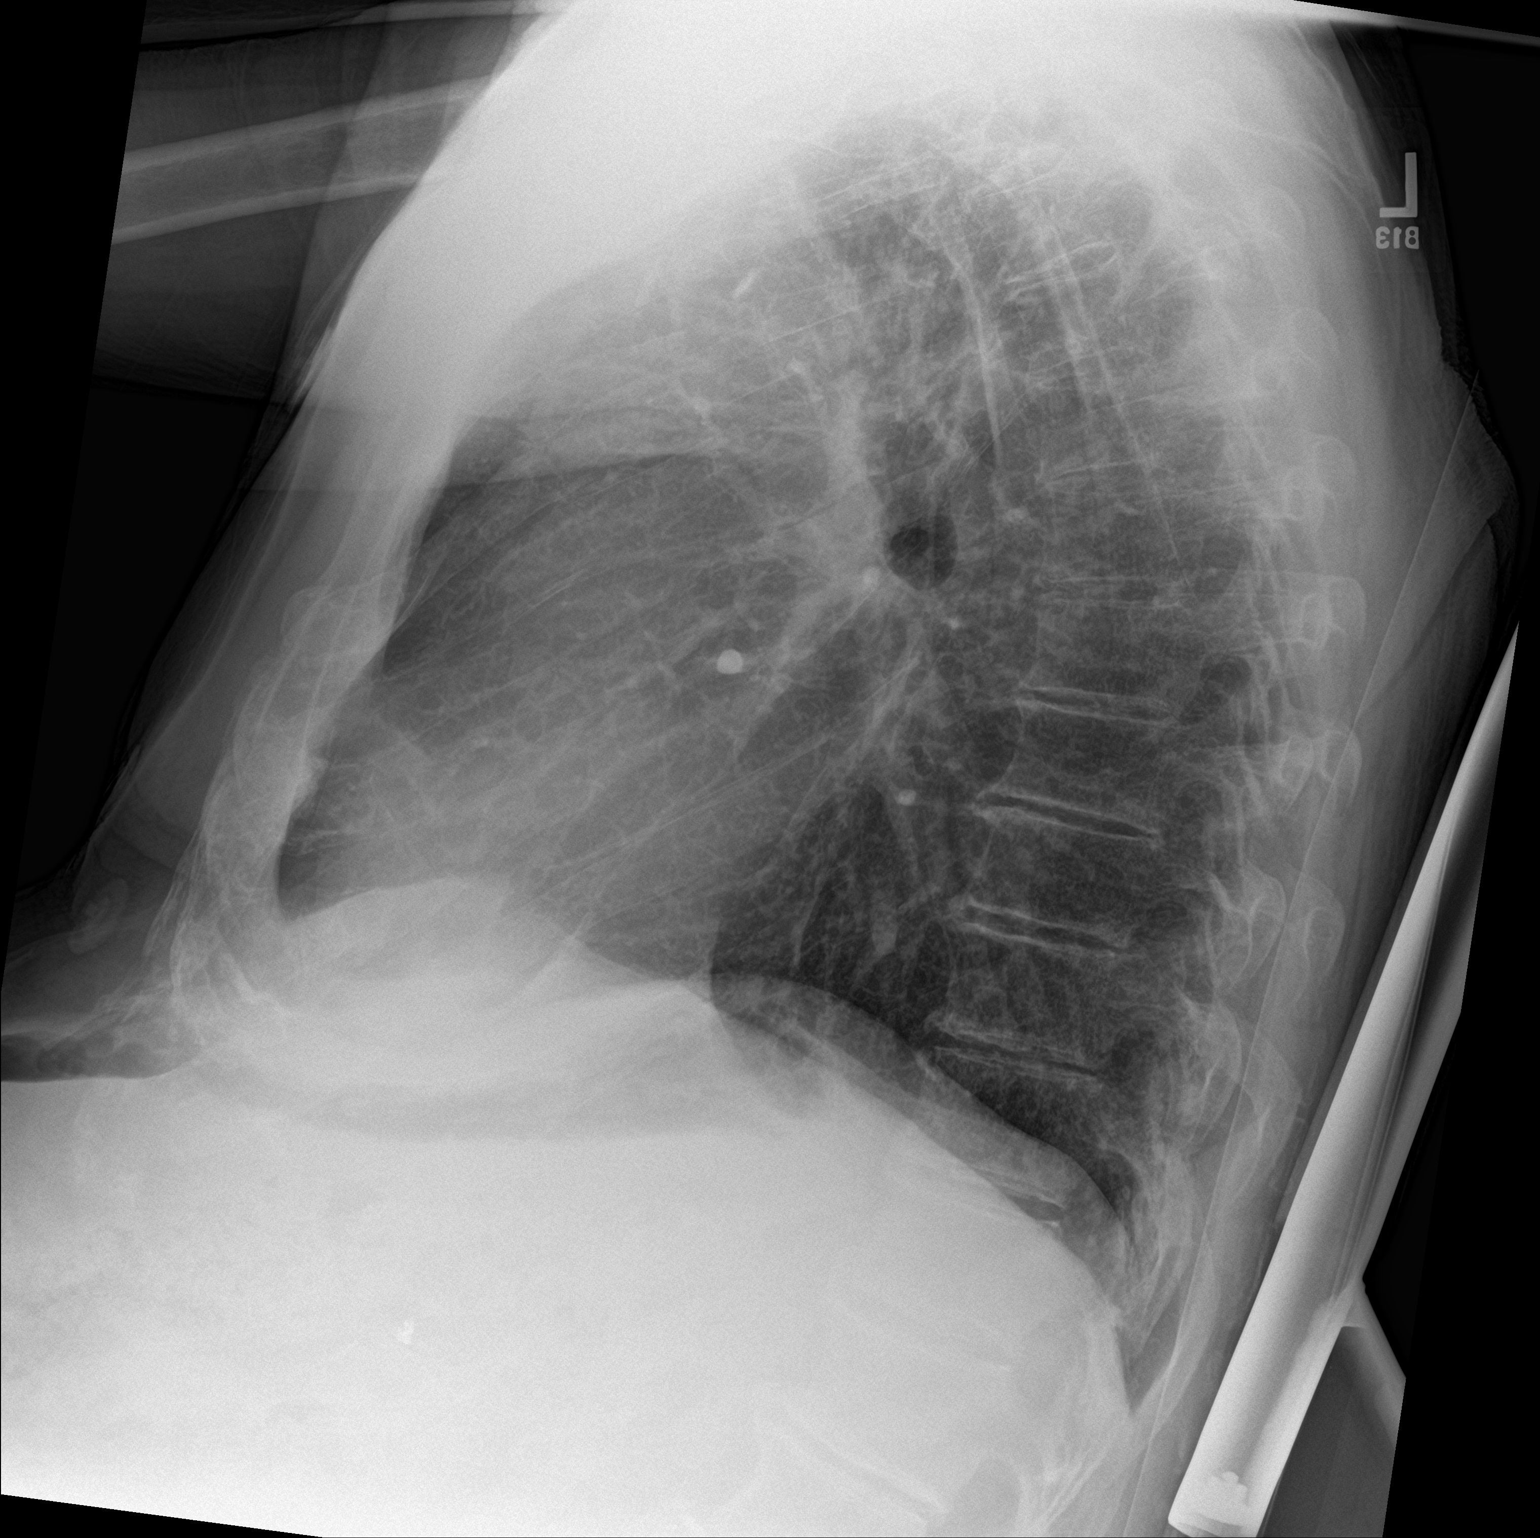

[chest ap]
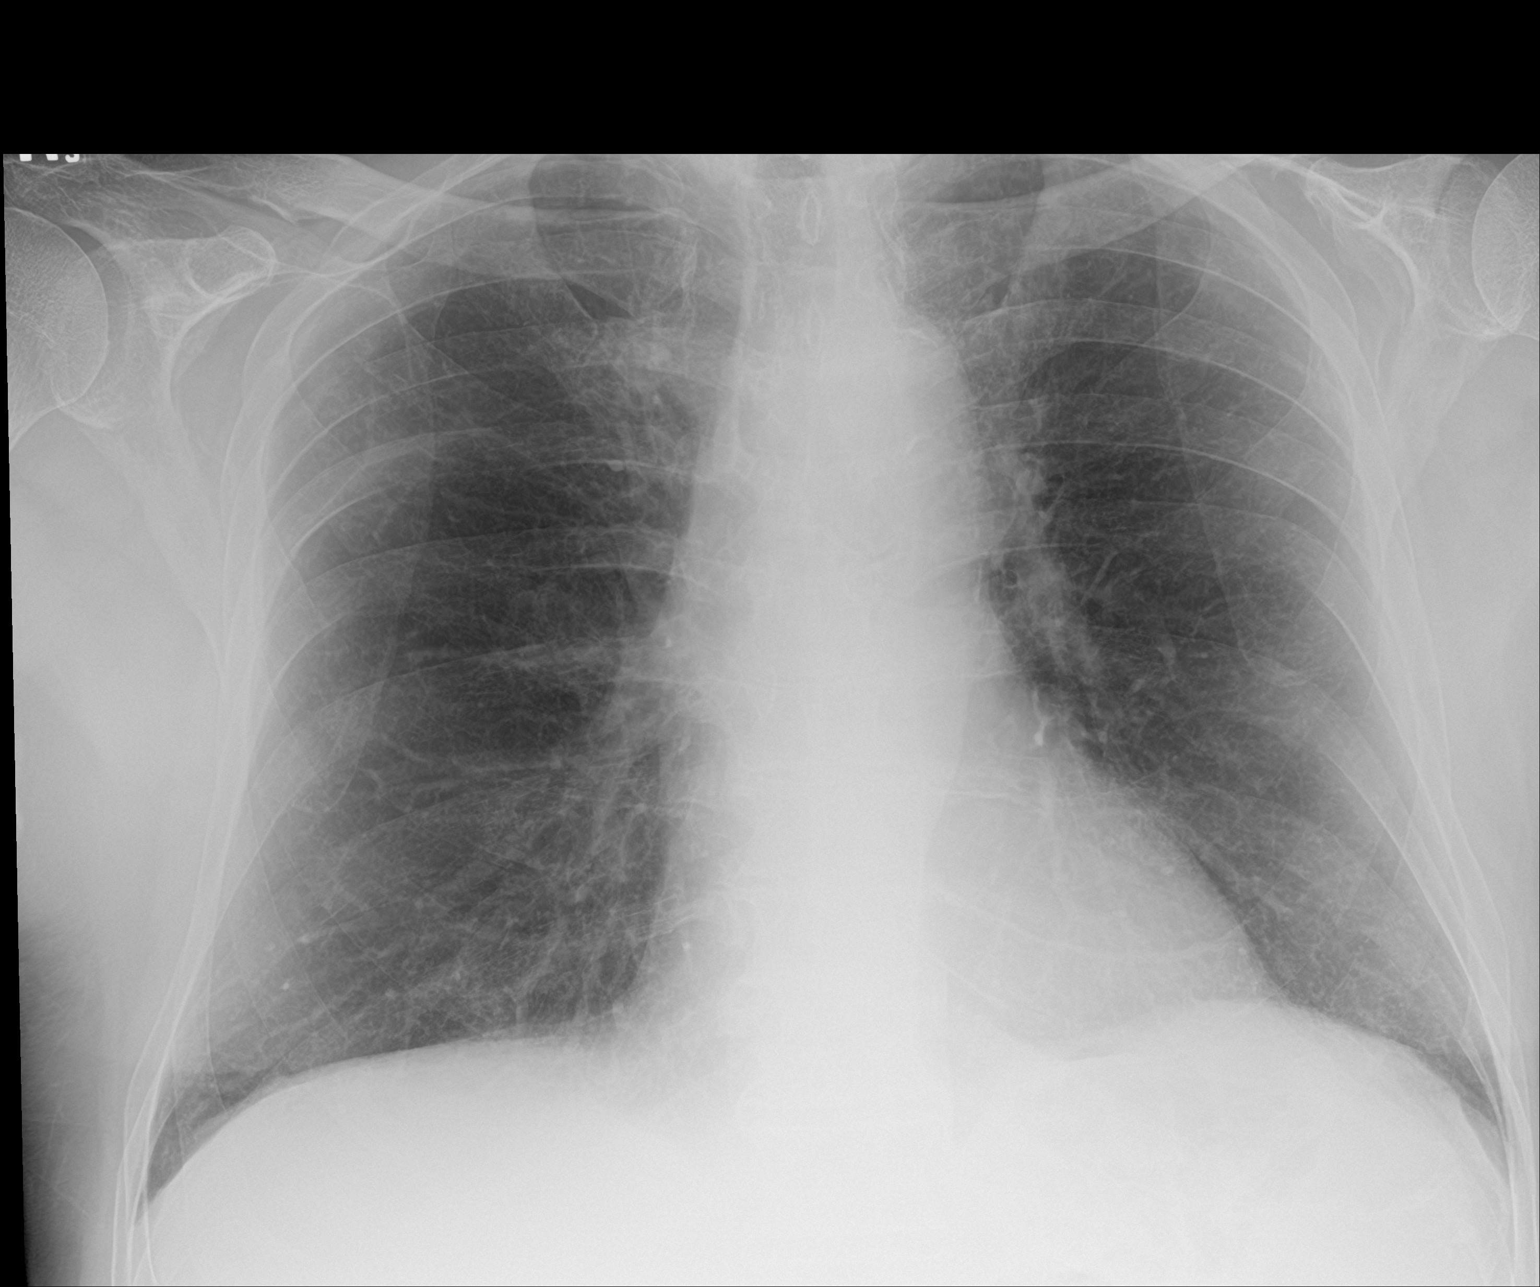

[2 of 2 positions shown; findings below may reference images not displayed]

FINDINGS: The lungs are mildly hyperinflated. The interstitial markings are
minimally prominent but improved over the appearance on the previous
study. The heart is normal in size. The pulmonary vascularity is
normal. There is calcification in the wall of the thoracic aorta.
There is no pleural effusion. The bony thorax exhibits no acute
abnormality.
IMPRESSION: No CHF or pneumonia. Mild chronic bronchitic-smoking related
changes.

Thoracic aortic atherosclerosis.

## 2019-11-21 ENCOUNTER — Other Ambulatory Visit: Payer: Self-pay | Admitting: Interventional Cardiology

## 2019-12-02 ENCOUNTER — Ambulatory Visit: Payer: Medicare Other | Attending: Internal Medicine

## 2019-12-02 DIAGNOSIS — Z23 Encounter for immunization: Secondary | ICD-10-CM | POA: Insufficient documentation

## 2019-12-02 NOTE — Progress Notes (Signed)
   Covid-19 Vaccination Clinic  Name:  [name]    MRN: [id number] DOB: Aug 27, 1941  12/02/2019  [name] was observed post Covid-19 immunization for 15 minutes without incidence. He was provided with Vaccine Information Sheet and instruction to access the V-Safe system.   [name] was instructed to call 911 with any severe reactions post vaccine: [name] Difficulty breathing  . Swelling of your face and throat  . A fast heartbeat  . A bad rash all over your body  . Dizziness and weakness    Immunizations Administered    Name Date Dose VIS Date Route   Pfizer COVID-19 Vaccine 12/02/2019 11:22 AM 0.3 mL 10/08/2019 Intramuscular   Manufacturer: [place]   Lot: [id number]   [place]: [id number]

## 2019-12-04 IMAGING — CT CT ABD-PELV W/ CM
2 of 5 series · 13 of 46 positions shown, 15 images · IV contrast (omnipaque)
Comparison: None.

CLINICAL DATA: Abdominal distension and right flank pain for
several weeks, initial encounter

EXAM:
CT ABDOMEN AND PELVIS WITH CONTRAST
TECHNIQUE: Multidetector CT imaging of the abdomen and pelvis was performed
using the standard protocol following bolus administration of
intravenous contrast.
CONTRAST:  100mL OMNIPAQUE IOHEXOL 300 MG/ML  SOLN

[Series 4: abd pelvis 5.00 br60 s3 ax · axial · 0.69mm/px · z∈[+1077,+1482]mm · 10 of 97 slices shown, 12 images]
[im 8/97  soft-tissue]
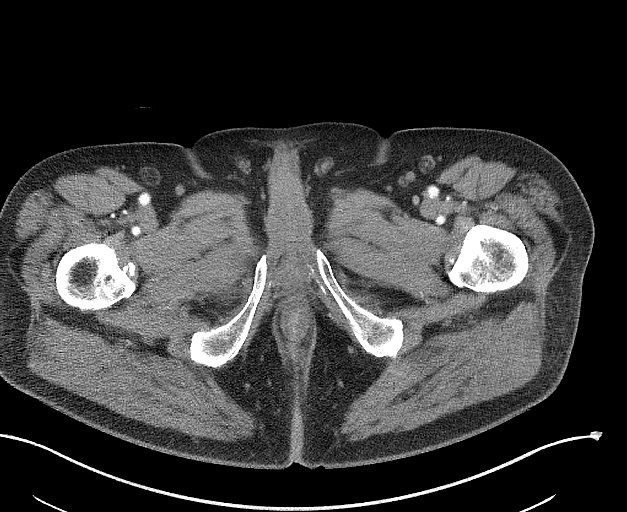
[im 8/97  bone]
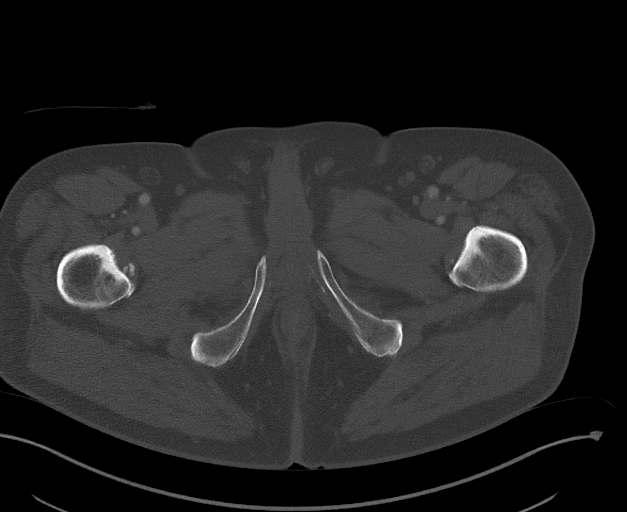
[im 15/97  soft-tissue]
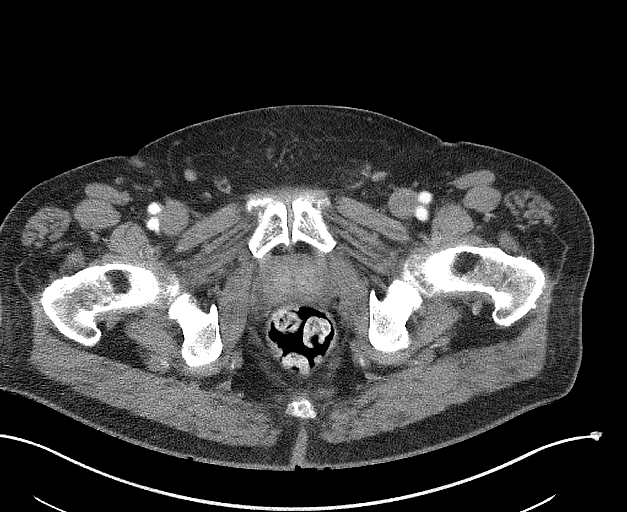
[im 30/97  soft-tissue]
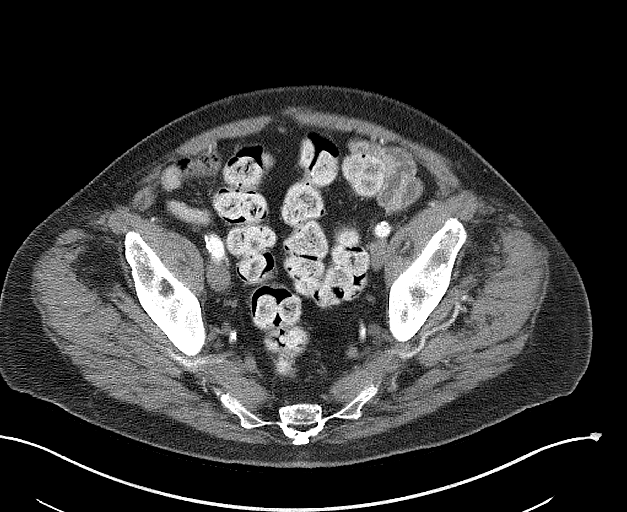
[im 37/97  soft-tissue]
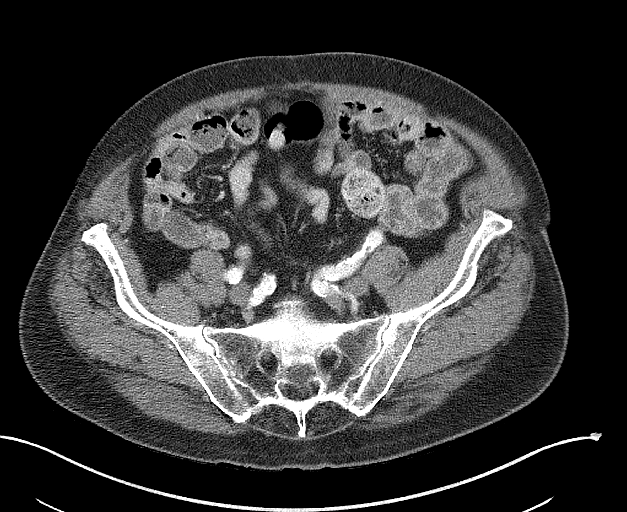
[im 45/97  soft-tissue]
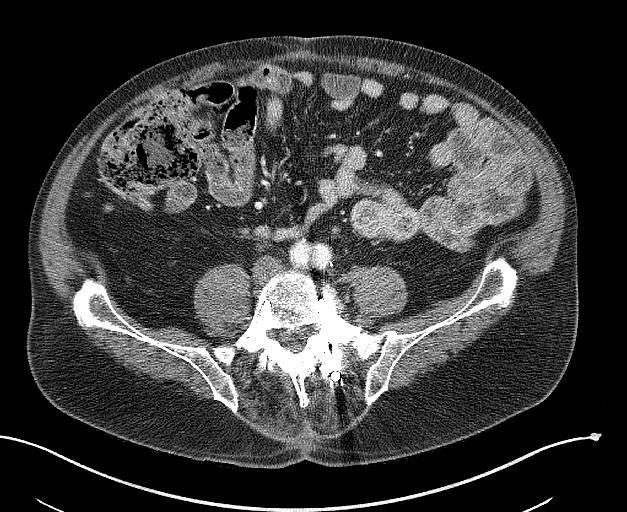
[im 52/97  soft-tissue]
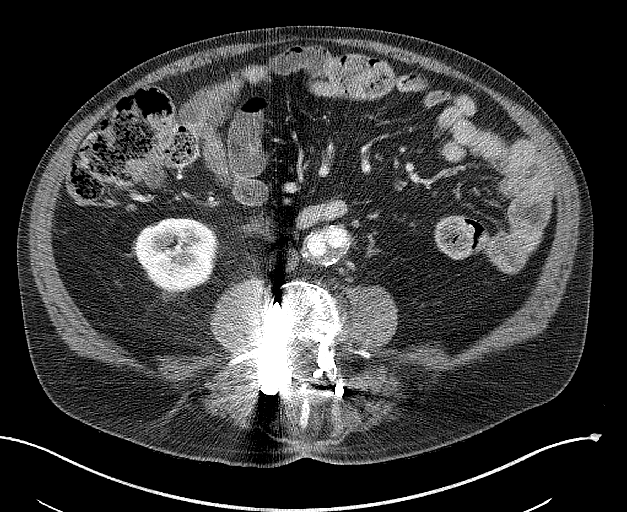
[im 60/97  soft-tissue]
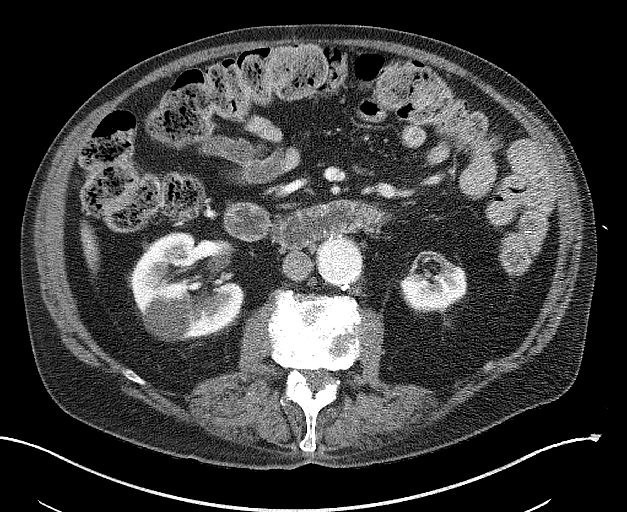
[im 74/97  soft-tissue]
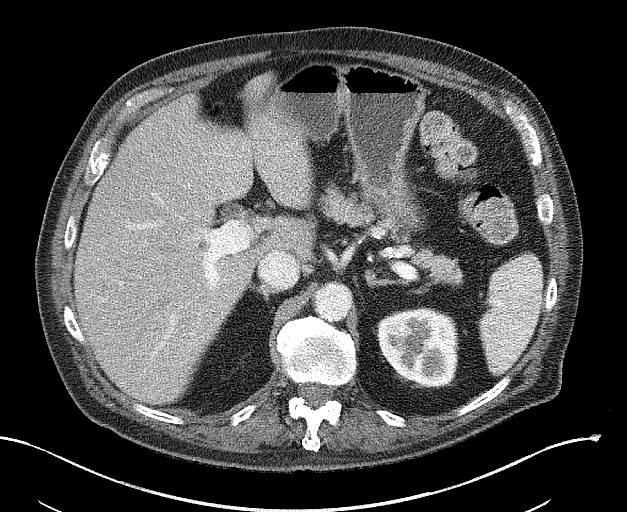
[im 82/97  soft-tissue]
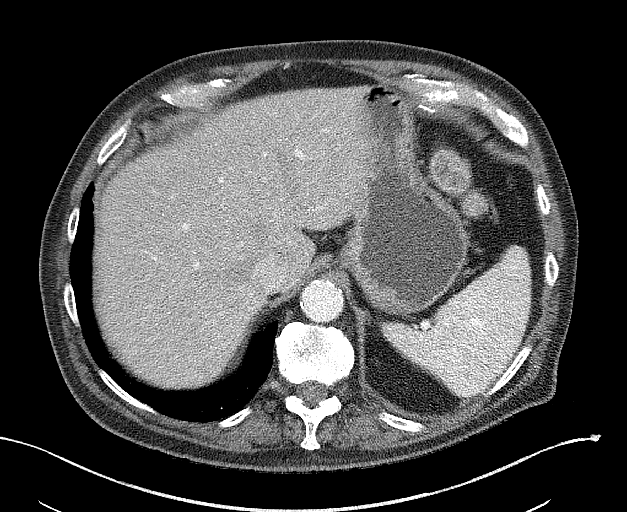
[im 82/97  bone]
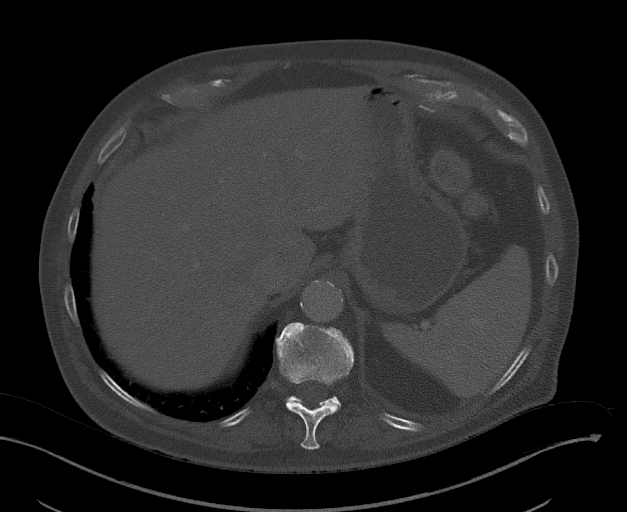
[im 89/97  soft-tissue]
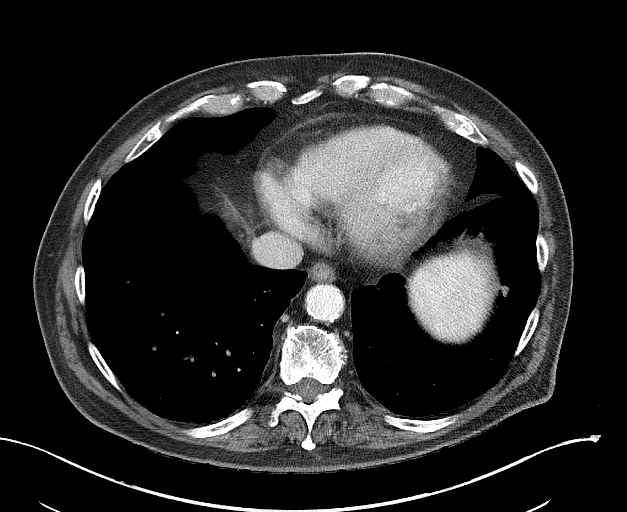

[Series 6: abd pelvis 2.00 br40 s3 cor · coronal · 0.85mm/px · 3 of 177 slices shown]
[im 59/177  soft-tissue]
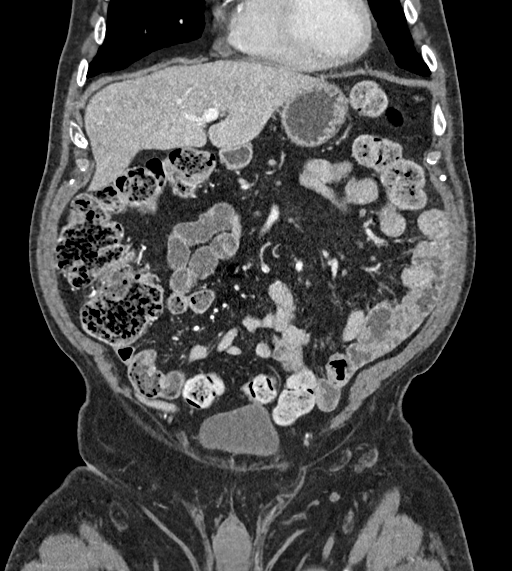
[im 79/177  soft-tissue]
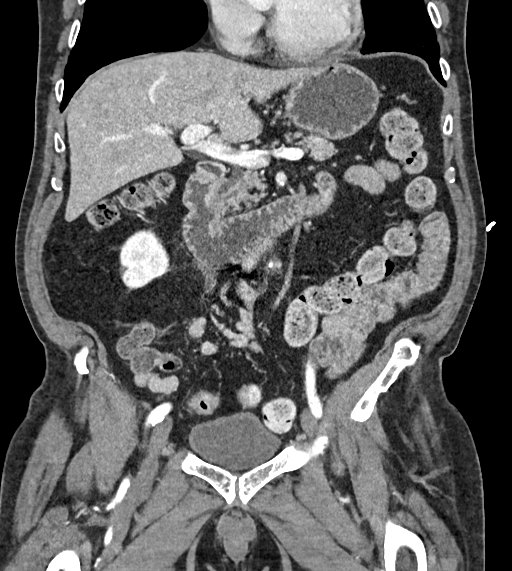
[im 98/177  soft-tissue]
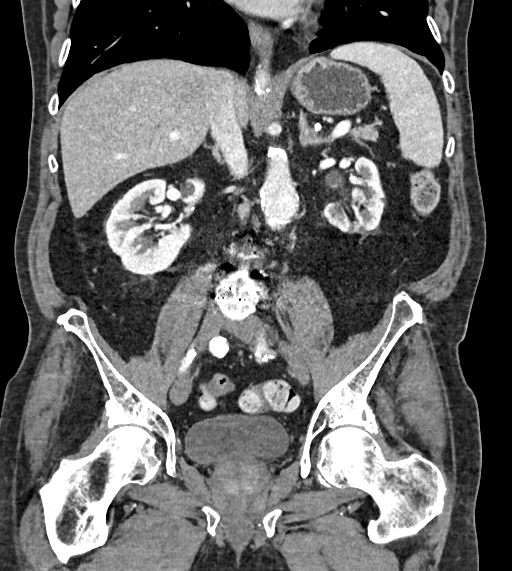

[13 of 46 positions shown; findings below may reference images not displayed]

FINDINGS: Lower chest: No acute abnormality.

Hepatobiliary: No focal liver abnormality is seen. Status post
cholecystectomy. No biliary dilatation.

Pancreas: Unremarkable. No pancreatic ductal dilatation or
surrounding inflammatory changes.

Spleen: Normal in size without focal abnormality.

Adrenals/Urinary Tract: Adrenal glands are within normal limits.
Scarring is noted in the lower pole of the left kidney with
nonobstructing left renal stone stable from the prior exam. Renal
vascular calcifications are seen. Cystic changes are noted within
the right kidney. The dominant cyst measures 4.2 cm increased in
size from the prior exam. No complicating factors are seen. No
obstructive changes are noted. The bladder is well distended.

Stomach/Bowel: The appendix is within normal limits. No obstructive
or inflammatory changes of the bowel are noted.

Vascular/Lymphatic: Atherosclerotic changes of the abdominal aorta
are noted. Some mild dilatation of the infrarenal aorta to 3.4 cm is
noted. Changes consistent with prior abdominal aortic aneurysm
repair are noted. The graft is patent. No significant enlargement in
the treated aneurysm sac is noted.

Reproductive: Prostate is unremarkable.

Other: No abdominal wall hernia or abnormality. No abdominopelvic
ascites.

Musculoskeletal: Degenerative changes of the lumbar spine are seen.
Postoperative changes are seen from L3-L5.
IMPRESSION: Changes consistent with prior open AAA repair. Patent graft is
noted. Slight aneurysmal dilatation of the infrarenal aorta above
the graft is noted to 3.4 cm.

Chronic changes as described above without acute abnormality.

## 2019-12-13 IMAGING — DX DG CHEST 1V PORT
1 series · 1 of 1 positions shown · non-contrast
Comparison: 02/04/2018

CLINICAL DATA: Near syncope

EXAM:
PORTABLE CHEST 1 VIEW

[chest ap]
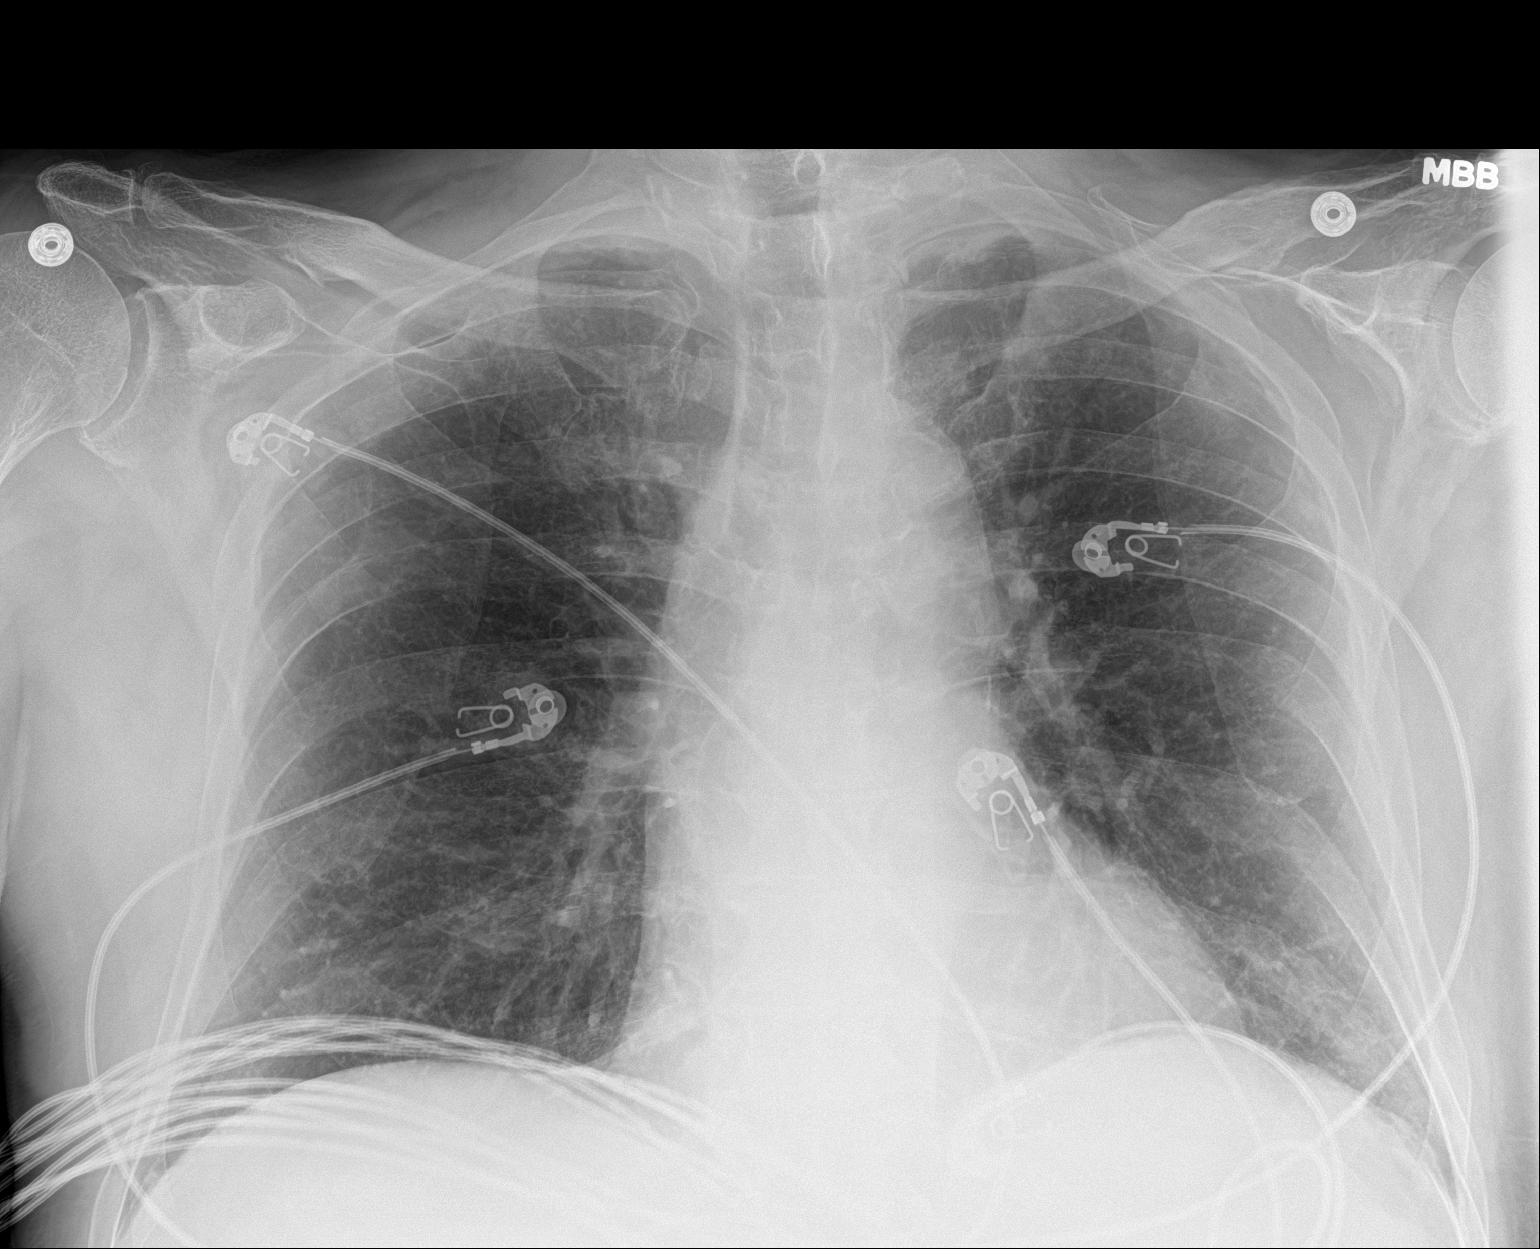

[1 of 1 positions shown; findings below may reference images not displayed]

FINDINGS: Artifact overlies the chest. Heart size is normal. Chronic aortic
atherosclerosis. Chronic pulmonary scarring. No evidence of active
infiltrate, mass, effusion or collapse. No pulmonary edema.
IMPRESSION: No active disease.  Aortic atherosclerosis.  Pulmonary scarring.

## 2019-12-23 ENCOUNTER — Ambulatory Visit: Payer: Medicare Other

## 2019-12-27 ENCOUNTER — Ambulatory Visit: Payer: Medicare Other | Attending: Internal Medicine

## 2019-12-27 DIAGNOSIS — Z23 Encounter for immunization: Secondary | ICD-10-CM | POA: Insufficient documentation

## 2019-12-27 NOTE — Progress Notes (Signed)
   Covid-19 Vaccination Clinic  Name:  [name]    MRN: [id number] DOB: [birth year]  12/27/2019  [name] was observed post Covid-19 immunization for 15 minutes without incidence. He was provided with Vaccine Information Sheet and instruction to access the V-Safe system.   [name] was instructed to call 911 with any severe reactions post vaccine: [name] Difficulty breathing  . Swelling of your face and throat  . A fast heartbeat  . A bad rash all over your body  . Dizziness and weakness    Immunizations Administered    Name Date Dose VIS Date Route   Pfizer COVID-19 Vaccine 12/27/2019 11:48 AM 0.3 mL 10/08/2019 Intramuscular   Manufacturer: [place]   Lot: [id number]   [place]: [id number]

## 2019-12-30 NOTE — Progress Notes (Signed)
Cardiology Office Note:    Date:  12/31/2019   ID:  ADI DORO, DOB Aug 18, 1941, MRN [id number]  PCP:  [name], MD  Cardiologist:  Sinclair Grooms, MD   Referring MD: [name], MD   Chief Complaint  Patient presents with  . Coronary Artery Disease  . Atrial Fibrillation  . Congestive Heart Failure    History of Present Illness:    [name] is a 79 y.o. male with a hx of coronary artery disease, chronic combined systolic and diastolic heart failure, prior circumflex, LAD stenting, diabetes mellitus, atrial fibrillation, chronic anticoagulation, and history of severe polyneuropathy involving the lower extremities.  Kristopher, Schultz is mated to his 78th birthday.  He appreciates having no particular cardiac symptoms at this time despite multiple vascular events in the past.  He denies chest pain, nitroglycerin use, fluid accumulation, orthopnea, PND, palpitations, and any recent episodes of syncope.  He is not having bleeding on his current medical regimen which includes Xarelto.  He denies any episodes of atrial fibrillation recently.  Past Medical History:  Diagnosis Date  . AAA (abdominal aortic aneurysm) ([place])    a. s/p repair 2009.  [name] Acid reflux    takes Prilosec and Protonix daily  . Arthritis    BacK  . CAD in native artery    a. a. prior inf MI 1993. b. PCI to LAD 05/1997. c. recurrent inferolateral MI complicated by V. fib arrest in 04/1998, prior PCI to OM1. d. 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  [name] Chronic combined systolic and diastolic CHF (congestive heart failure) ([place])   . Complication of anesthesia    -years ago hair fell out.;ileus after 2 of his surgeries  . Dry skin   . Foot drop, bilateral   . Hypercholesteremia   . Hypertension   . Ileus ([place])    After AAA  . Incisional hernia    "small, from AAA"  . Ischemic cardiomyopathy   . MI (myocardial infarction) ([place])   . 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 OSA on CPAP    uses CPAP--sleep study  done at least 54yrs ago  . PAF (paroxysmal atrial fibrillation) ([place])   . Pain    back pain chronic- seen at pain clinic  . Plantar fasciitis    bilatetral  . Thrombocytopenia ([place])   . Type II diabetes mellitus ([place])     Past Surgical History:  Procedure Laterality Date  . ABDOMINAL AORTIC ANEURYSM REPAIR    . BACK SURGERY  2012-2013 X 3   Miminal Invasive x 3in [place].  [name] CARDIAC CATHETERIZATION  2009/2012  . CARDIOVERSION N/A 04/12/2015   Procedure: CARDIOVERSION;  Surgeon: Sueanne Margarita, MD;  Location: [facility];  Service: Cardiovascular;  Laterality: N/A;  . CARDIOVERSION N/A 12/26/2016   Procedure: CARDIOVERSION;  Surgeon: Sueanne Margarita, MD;  Location: [facility];  Service: Cardiovascular;  Laterality: N/A;  . COLONOSCOPY    . CORONARY ANGIOPLASTY WITH STENT PLACEMENT  1992; 1997  . LAPAROSCOPIC CHOLECYSTECTOMY    . LUMBAR LAMINECTOMY/DECOMPRESSION MICRODISCECTOMY Right 01/16/2016   Procedure: Right Lumbar five-Sacral one Laminectomy;  Surgeon: Kristeen Miss, MD;  Location: [facility];  Service: Neurosurgery;  Laterality: Right;  Right L5-S1 Laminectomy    Current Medications: Current Meds  Medication Sig  . amiodarone (PACERONE) 200 MG tablet Take 1 tablet by mouth once daily  . amitriptyline (ELAVIL) 25 MG tablet Take 25 mg by mouth at bedtime.   [name]  atorvastatin (LIPITOR) 10 MG tablet Take 10 mg by mouth daily.  . Coenzyme Q10 (CO Q 10) 100 MG CAPS Take 100 mg by mouth daily.  . diazepam (VALIUM) 5 MG tablet Take 5 mg by mouth at bedtime as needed (leg cramps/sleep).   . docusate sodium (COLACE) 100 MG capsule Take 1 capsule (100 mg total) by mouth 2 (two) times daily.  . empagliflozin (JARDIANCE) 10 MG TABS tablet Take 10 mg by mouth daily with breakfast.   . glimepiride (AMARYL) 1 MG tablet Take 1 mg by mouth daily with breakfast.  . metFORMIN (GLUCOPHAGE-XR) 500 MG 24 hr tablet Take 500-1,000 mg by mouth See admin instructions. 1,000 mg in the morning and 500 mg  in the evening  . metoprolol tartrate (LOPRESSOR) 50 MG tablet Take 1 tablet by mouth twice daily  . Multiple Vitamins-Minerals (ONE-A-DAY MENS 50+ ADVANTAGE) TABS Take 1 tablet by mouth daily.  [name] omeprazole (PRILOSEC) 20 MG capsule Take 20 mg by mouth daily.  [name] oxyCODONE-acetaminophen (PERCOCET/ROXICET) 5-325 MG tablet Take 1 tablet by mouth every 6 (six) hours as needed (pain).   . polyethylene glycol (MIRALAX / GLYCOLAX) 17 g packet Take 17 g by mouth daily as needed for moderate constipation.  . pregabalin (LYRICA) 300 MG capsule Take 1 capsule by mouth 2 (two) times a day.  . ramipril (ALTACE) 5 MG capsule Take 1 capsule (5 mg total) by mouth daily.  . rivaroxaban (XARELTO) 20 MG TABS tablet Take 20 mg by mouth daily with supper.  . vitamin B-12 (CYANOCOBALAMIN) 1000 MCG tablet Take 1,000 mcg by mouth daily.      Allergies:   Cymbalta [duloxetine hcl], Neurontin [gabapentin], and Keppra [levetiracetam]   Social History   Socioeconomic History  . Marital status: Married    Spouse name: Not on file  . Number of children: Not on file  . Years of education: Not on file  . Highest education level: Not on file  Occupational History  . Not on file  Tobacco Use  . Smoking status: Former Smoker    Packs/day: 1.00    Years: 33.00    Pack years: 33.00    Types: Cigarettes    Quit date: 01/01/1992    Years since quitting: 28.0  . Smokeless tobacco: Never Used  Substance and Sexual Activity  . Alcohol use: Yes    Comment: 6/30;2017 "GLASS OF WINE Q COUPLE MONTHS, IF THAT"  . Drug use: No  . Sexual activity: Yes  Other Topics Concern  . Not on file  Social History Narrative  . Not on file   Social Determinants of Health   Financial Resource Strain:   . Difficulty of Paying Living Expenses: Not on file  Food Insecurity:   . Worried About Charity fundraiser in the Last Year: Not on file  . Ran Out of Food in the Last Year: Not on file  Transportation Needs:   . Lack of  Transportation (Medical): Not on file  . Lack of Transportation (Non-Medical): Not on file  Physical Activity:   . Days of Exercise per Week: Not on file  . Minutes of Exercise per Session: Not on file  Stress:   . Feeling of Stress : Not on file  Social Connections:   . Frequency of Communication with Friends and Family: Not on file  . Frequency of Social Gatherings with Friends and Family: Not on file  . Attends Religious Services: Not on file  . Active Member of Clubs or  Organizations: Not on file  . Attends Archivist Meetings: Not on file  . Marital Status: Not on file     Family History: The patient's family history includes Arthritis in his father; Coronary artery disease in his father and mother; Diabetes in his father and mother; Rheum arthritis in his father.  ROS:   Please see the history of present illness.    Major issues can continue to be paresthesias in his legs, difficulty walking, low back pain.  Has not had abdominal pain.  Cymbalta was discontinued and that seemed to be some of his gastrointestinal problem.  All other systems reviewed and are negative.  EKGs/Labs/Other Studies Reviewed:    The following studies were reviewed today: No recent cardiac evaluation has been needed.  EKG:  EKG the most recent EKG performed in September 2020 demonstrates ventricular bigeminy, incomplete right bundle, old inferior infarction, and lateral Q waves.  Recent Labs: 07/05/2019: B Natriuretic Peptide 222.0 07/06/2019: Magnesium 2.1 07/09/2019: Hemoglobin 12.2; Platelets 150 07/10/2019: ALT 18; BUN 10; Creatinine, Ser 0.67; Potassium 3.9; Sodium 140  Recent Lipid Panel    Component Value Date/Time   CHOL  11/26/2010 0750    147        ATP III CLASSIFICATION:  <200     mg/dL   Desirable  200-239  mg/dL   Borderline High  >=240    mg/dL   High          TRIG 132 11/26/2010 0750   HDL 29 (L) 11/26/2010 0750   CHOLHDL 5.1 11/26/2010 0750   VLDL 26 11/26/2010 0750    LDLCALC  11/26/2010 0750    92        Total Cholesterol/HDL:CHD Risk Coronary Heart Disease Risk Table                     Men   Women  1/2 Average Risk   3.4   3.3  Average Risk       5.0   4.4  2 X Average Risk   9.6   7.1  3 X Average Risk  23.4   11.0        Use the calculated Patient Ratio above and the CHD Risk Table to determine the patient's CHD Risk.        ATP III CLASSIFICATION (LDL):  <100     mg/dL   Optimal  100-129  mg/dL   Near or Above                    Optimal  130-159  mg/dL   Borderline  160-189  mg/dL   High  >190     mg/dL   Very High    Physical Exam:    VS:  BP (!) 106/58   Pulse 68   Ht 6\' 1"  (1.854 m)   Wt 190 lb 1.9 oz (86.2 kg)   SpO2 96%   BMI 25.08 kg/m     Wt Readings from Last 3 Encounters:  12/31/19 190 lb 1.9 oz (86.2 kg)  07/04/19 207 lb 3.7 oz (94 kg)  07/16/18 213 lb 1.9 oz (96.7 kg)     GEN: Mildly to moderately overweight.. No acute distress HEENT: Normal NECK: No JVD. LYMPHATICS: No lymphadenopathy CARDIAC:  RRR without murmur, gallop, or edema. VASCULAR:  Normal Pulses. No bruits. RESPIRATORY:  Clear to auscultation without rales, wheezing or rhonchi  ABDOMEN: Soft, non-tender, non-distended, No pulsatile mass, MUSCULOSKELETAL: No deformity  SKIN:  Warm and dry NEUROLOGIC:  Alert and oriented x 3 PSYCHIATRIC:  Normal affect   ASSESSMENT:    1. Atherosclerosis of native coronary artery of native heart without angina pectoris   2. Long term current use of anticoagulant therapy   3. Chronic combined systolic and diastolic heart failure (HCC)   4. Paroxysmal atrial fibrillation with RVR ([place])   5. On amiodarone therapy   6. Other hyperlipidemia   7. Essential hypertension   8. Educated about COVID-19 virus infection    PLAN:    In order of problems listed above:  1. Secondary preventive measures are discussed and are long-term goal. 2. He will continue to monitor for evidence of bleeding in the stool or at other  sites.  Continue Xarelto. 3. There is no evidence of volume overload in the current medical regimen includes Jardiance, Lopressor, and Altace which will all be continued. 4. Amiodarone is being used to suppress recurrent atrial fibrillation with RVR.  He is clinically in sinus rhythm today. 5. TSH and liver panel is obtained today. 6. Continue statin therapy.  Target LDL is less than 70.  Most recently was 49 in August 2020. 7. He has gotten the COVID-19 vaccine.  He continues to practice social distancing and mask wearing.  Overall education and awareness concerning primary/secondary risk prevention was discussed in detail: LDL less than 70, hemoglobin A1c less than 7, blood pressure target less than 130/80 mmHg, >150 minutes of moderate aerobic activity per week, avoidance of smoking, weight control (via diet and exercise), and continued surveillance/management of/for obstructive sleep apnea.   Medication Adjustments/Labs and Tests Ordered: Current medicines are reviewed at length with the patient today.  Concerns regarding medicines are outlined above.  Orders Placed This Encounter  Procedures  . Basic metabolic panel  . Hepatic function panel  . TSH   No orders of the defined types were placed in this encounter.   Patient Instructions  Medication Instructions:  Your physician recommends that you continue on your current medications as directed. Please refer to the Current Medication list given to you today.  *If you need a refill on your cardiac medications before your next appointment, please call your pharmacy*   Lab Work: BMET, Liver and Lipid today  If you have labs (blood work) drawn today and your tests are completely normal, you will receive your results only by: [name] MyChart Message (if you have MyChart) OR . A paper copy in the mail If you have any lab test that is abnormal or we need to change your treatment, we will call you to review the  results.   Testing/Procedures: None   Follow-Up: At [facility], you and your health needs are our priority.  As part of our continuing mission to provide you with exceptional heart care, we have created designated Provider Care Teams.  These Care Teams include your primary Cardiologist (physician) and Advanced Practice Providers (APPs -  Physician Assistants and Nurse Practitioners) who all work together to provide you with the care you need, when you need it.  We recommend signing up for the patient portal called "MyChart".  Sign up information is provided on this After Visit Summary.  MyChart is used to connect with patients for Virtual Visits (Telemedicine).  Patients are able to view lab/test results, encounter notes, upcoming appointments, etc.  Non-urgent messages can be sent to your provider as well.   To learn more about what you can do with MyChart, go to [email or web address].  Your next appointment:   6 month(s)  The format for your next appointment:   In Person  Provider:   You may see Sinclair Grooms, MD or one of the following Advanced Practice Providers on your designated Care Team:    Truitt Merle, NP  Cecilie Kicks, NP  Kathyrn Drown, NP    Other Instructions      Signed, Sinclair Grooms, MD  12/31/2019 5:41 PM    [place]

## 2019-12-31 ENCOUNTER — Ambulatory Visit: Payer: Medicare Other | Admitting: Interventional Cardiology

## 2019-12-31 ENCOUNTER — Encounter: Payer: Self-pay | Admitting: Interventional Cardiology

## 2019-12-31 ENCOUNTER — Other Ambulatory Visit: Payer: Self-pay

## 2019-12-31 VITALS — BP 106/58 | HR 68 | Ht 73.0 in | Wt 190.1 lb

## 2019-12-31 DIAGNOSIS — I2510 Atherosclerotic heart disease of native coronary artery without angina pectoris: Secondary | ICD-10-CM

## 2019-12-31 DIAGNOSIS — I5042 Chronic combined systolic (congestive) and diastolic (congestive) heart failure: Secondary | ICD-10-CM

## 2019-12-31 DIAGNOSIS — Z7901 Long term (current) use of anticoagulants: Secondary | ICD-10-CM

## 2019-12-31 DIAGNOSIS — I480 Paroxysmal atrial fibrillation: Secondary | ICD-10-CM

## 2019-12-31 DIAGNOSIS — I10 Essential (primary) hypertension: Secondary | ICD-10-CM

## 2019-12-31 DIAGNOSIS — E7849 Other hyperlipidemia: Secondary | ICD-10-CM

## 2019-12-31 DIAGNOSIS — Z79899 Other long term (current) drug therapy: Secondary | ICD-10-CM

## 2019-12-31 DIAGNOSIS — Z7189 Other specified counseling: Secondary | ICD-10-CM

## 2019-12-31 NOTE — Patient Instructions (Signed)
Medication Instructions:  Your physician recommends that you continue on your current medications as directed. Please refer to the Current Medication list given to you today.  *If you need a refill on your cardiac medications before your next appointment, please call your pharmacy*   Lab Work: BMET, Liver and Lipid today  If you have labs (blood work) drawn today and your tests are completely normal, you will receive your results only by: [name] MyChart Message (if you have MyChart) OR . A paper copy in the mail If you have any lab test that is abnormal or we need to change your treatment, we will call you to review the results.   Testing/Procedures: None   Follow-Up: At [facility], you and your health needs are our priority.  As part of our continuing mission to provide you with exceptional heart care, we have created designated Provider Care Teams.  These Care Teams include your primary Cardiologist (physician) and Advanced Practice Providers (APPs -  Physician Assistants and Nurse Practitioners) who all work together to provide you with the care you need, when you need it.  We recommend signing up for the patient portal called "MyChart".  Sign up information is provided on this After Visit Summary.  MyChart is used to connect with patients for Virtual Visits (Telemedicine).  Patients are able to view lab/test results, encounter notes, upcoming appointments, etc.  Non-urgent messages can be sent to your provider as well.   To learn more about what you can do with MyChart, go to [email or web address].    Your next appointment:   6 month(s)  The format for your next appointment:   In Person  Provider:   You may see Sinclair Grooms, MD or one of the following Advanced Practice Providers on your designated Care Team:    Truitt Merle, NP  Cecilie Kicks, NP  Kathyrn Drown, NP    Other Instructions

## 2020-01-01 LAB — BASIC METABOLIC PANEL
BUN/Creatinine Ratio: 22 (ref 10–24)
BUN: 26 mg/dL (ref 8–27)
CO2: 24 mmol/L (ref 20–29)
Calcium: 9.4 mg/dL (ref 8.6–10.2)
Chloride: 95 mmol/L — ABNORMAL LOW (ref 96–106)
Creatinine, Ser: 1.17 mg/dL (ref 0.76–1.27)
GFR calc Af Amer: 68 mL/min/{1.73_m2} (ref >59)
GFR calc non Af Amer: 59 mL/min/{1.73_m2} — ABNORMAL LOW (ref >59)
Glucose: 232 mg/dL — ABNORMAL HIGH (ref 65–99)
Potassium: 5 mmol/L (ref 3.5–5.2)
Sodium: 132 mmol/L — ABNORMAL LOW (ref 134–144)

## 2020-01-01 LAB — HEPATIC FUNCTION PANEL
ALT: 30 IU/L (ref 0–44)
AST: 22 IU/L (ref 0–40)
Albumin: 4.2 g/dL (ref 3.7–4.7)
Alkaline Phosphatase: 92 IU/L (ref 39–117)
Bilirubin Total: 0.5 mg/dL (ref 0.0–1.2)
Bilirubin, Direct: 0.18 mg/dL (ref 0.00–0.40)
Total Protein: 6.8 g/dL (ref 6.0–8.5)

## 2020-01-01 LAB — TSH: TSH: 1.14 u[IU]/mL (ref 0.450–4.500)

## 2020-01-07 IMAGING — CT CT HEAD W/O CM
3 series · 14 of 33 positions shown, 17 images · non-contrast
Comparison: No comparison head CT.

CLINICAL DATA: 77-year-old male post multiple falls. Did not hit
head. On blood thinner. Recent episode of difficulty finding words.
Initial encounter.

EXAM:
CT HEAD WITHOUT CONTRAST
TECHNIQUE: Contiguous axial images were obtained from the base of the skull
through the vertex without intravenous contrast.

[Series 2: head 5.0 h37s · axial · 0.41mm/px · z∈[+121,+241]mm · 6 of 32 slices shown, 8 images]
[im 5/32  soft-tissue]
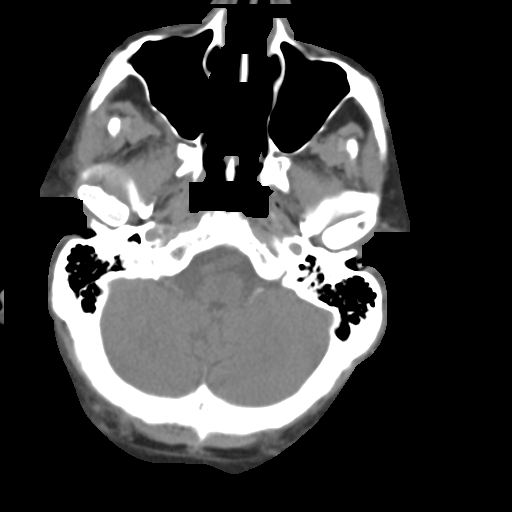
[im 5/32  bone]
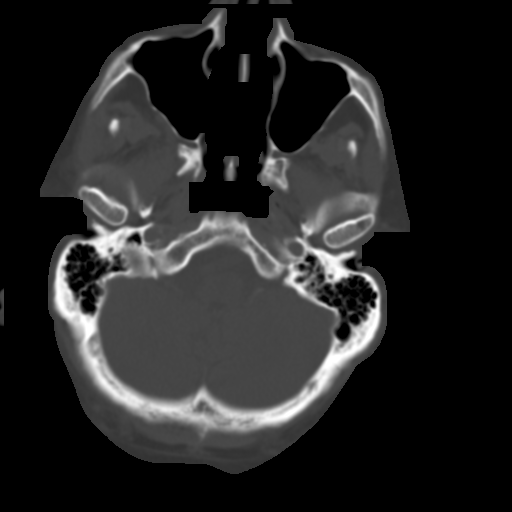
[im 10/32  bone]
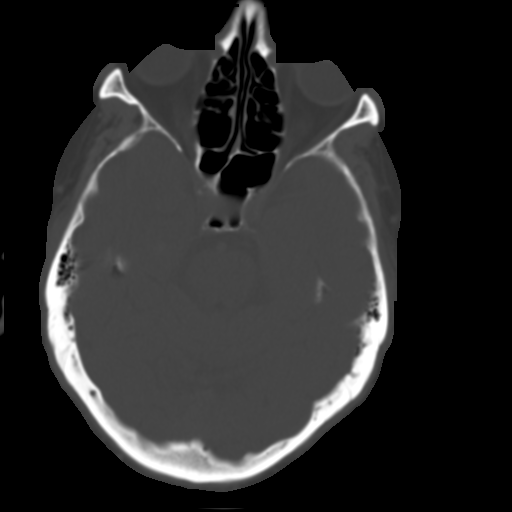
[im 15/32  bone]
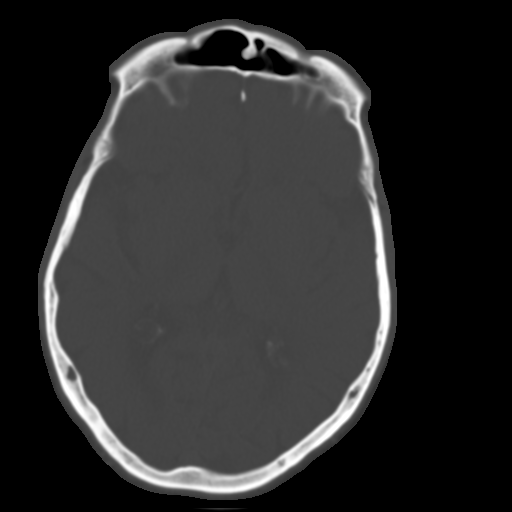
[im 20/32  bone]
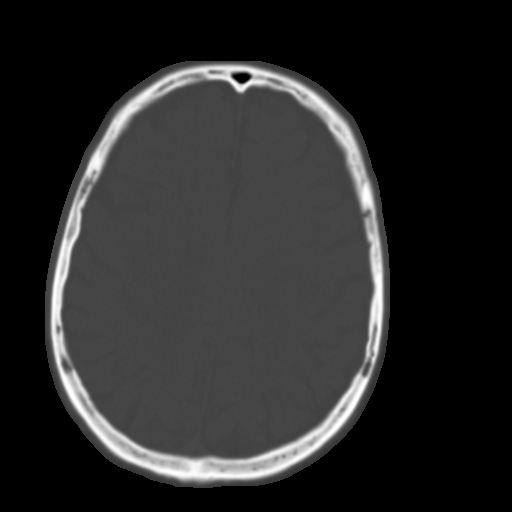
[im 24/32  soft-tissue]
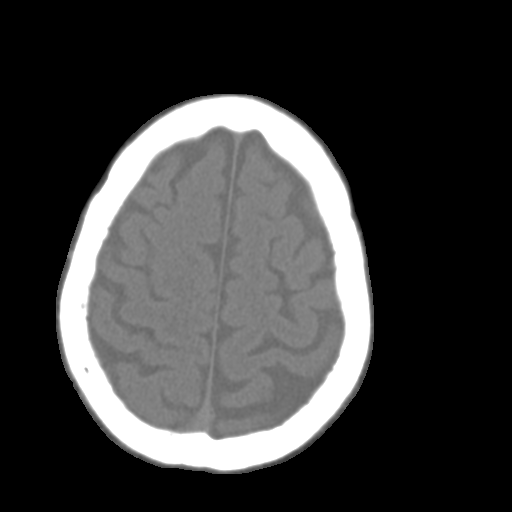
[im 24/32  bone]
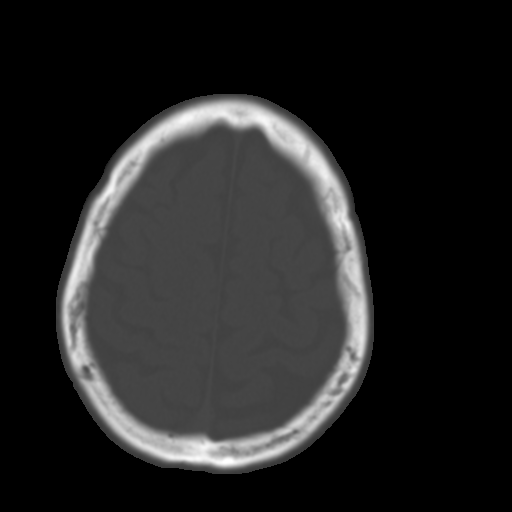
[im 29/32  bone]
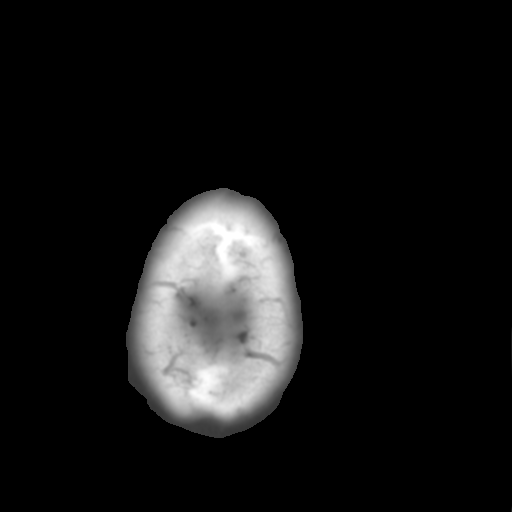

[Series 4: head 3.0 mpr coronal · coronal · 0.31mm/px · 3 of 65 slices shown]
[im 13/65  bone]
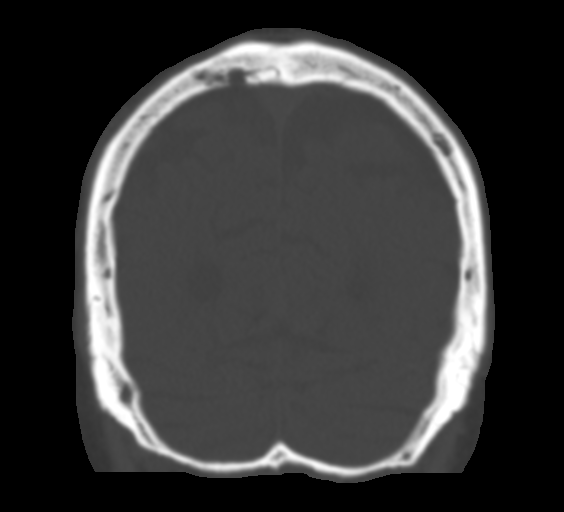
[im 26/65  bone]
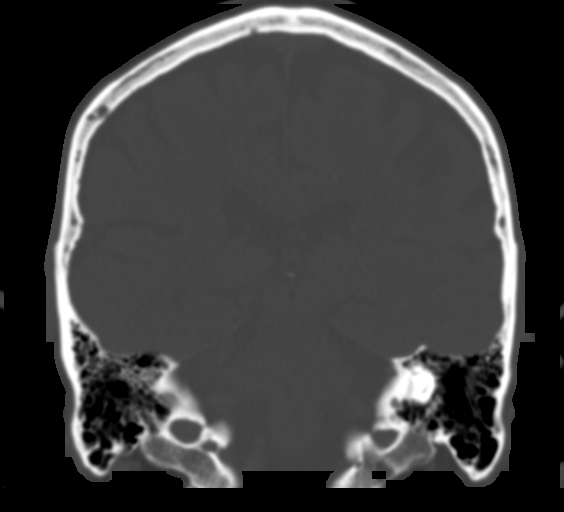
[im 39/65  bone]
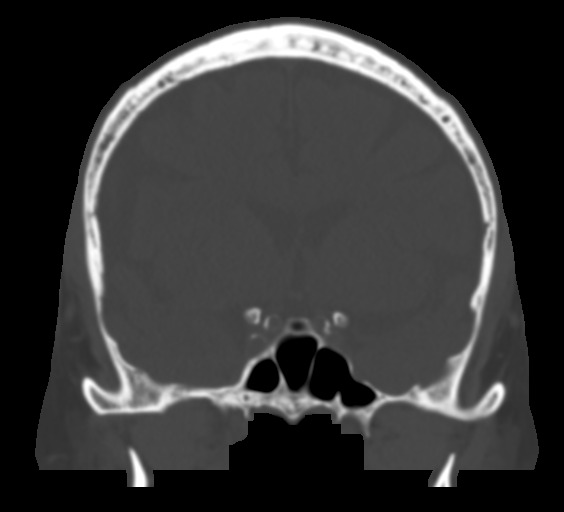

[Series 5: head 3.0 mpr sagittal · sagittal · 0.31mm/px · 5 of 56 slices shown, 6 images]
[im 19/56  bone]
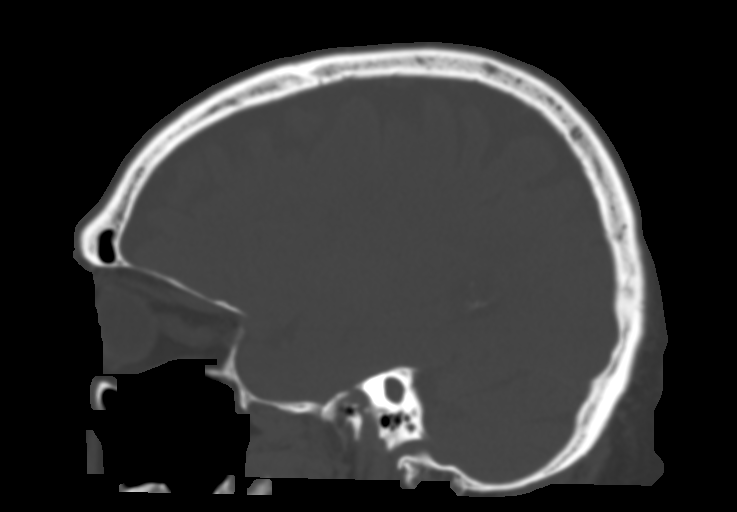
[im 23/56  bone]
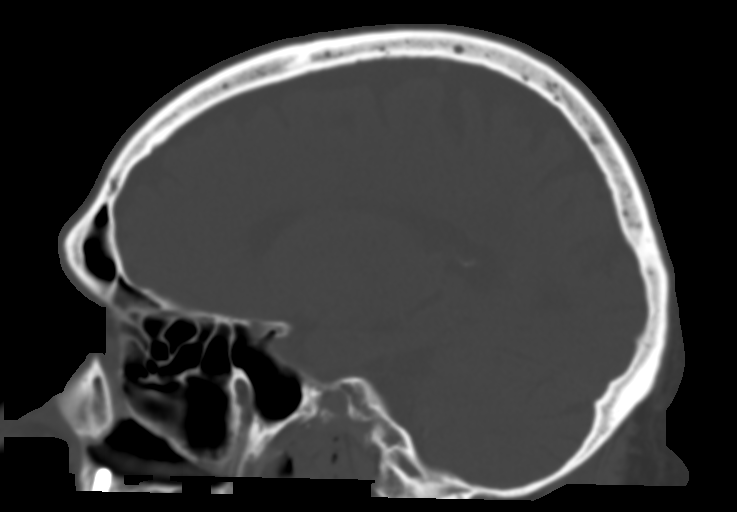
[im 28/56  soft-tissue]
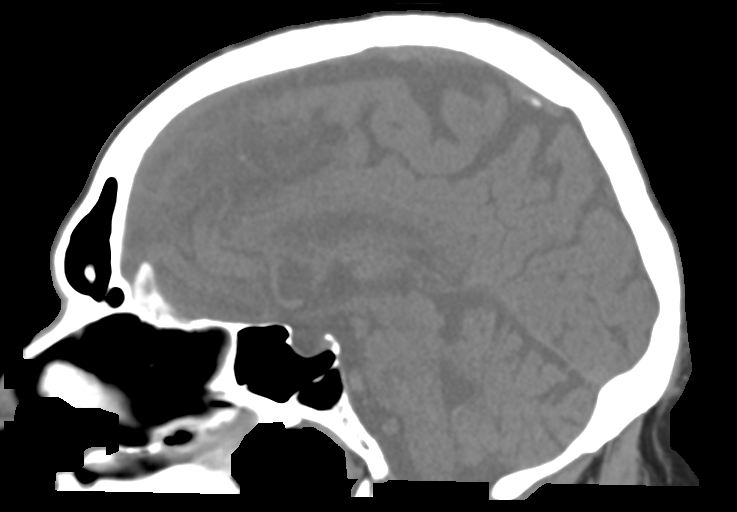
[im 28/56  bone]
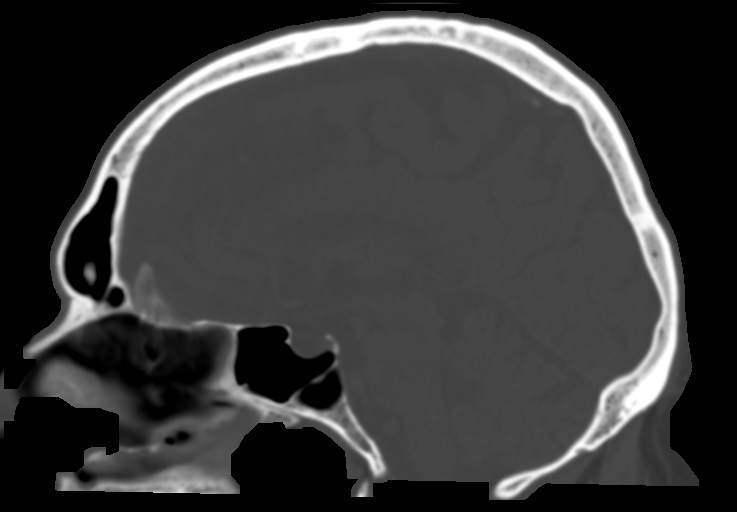
[im 33/56  bone]
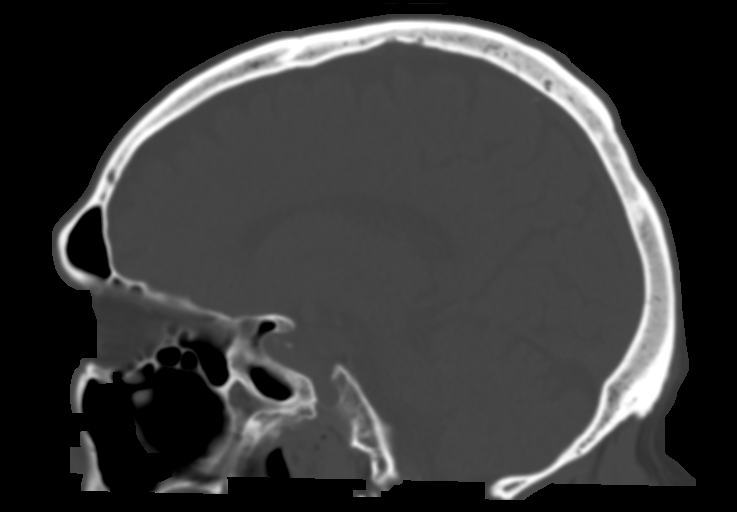
[im 37/56  bone]
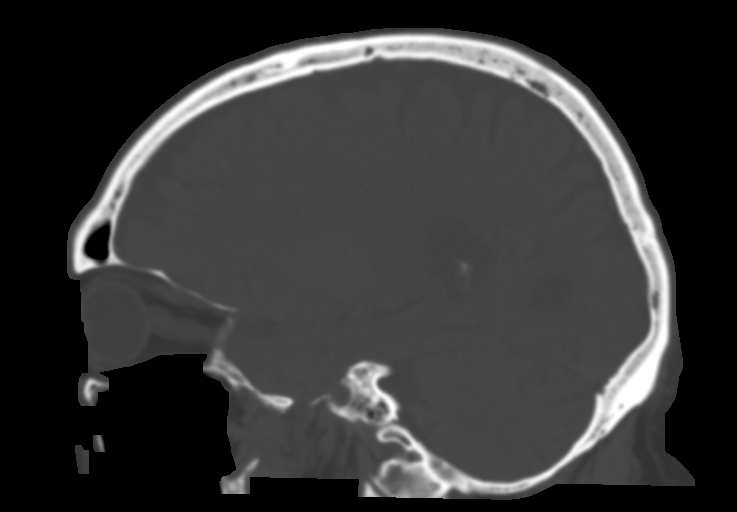

[14 of 33 positions shown; findings below may reference images not displayed]

FINDINGS: Brain: No intracranial hemorrhage or CT evidence of large acute
infarct. Chronic microvascular changes. Global atrophy. No
intracranial mass lesion noted on this unenhanced exam.

Vascular: Vascular calcifications.

Skull: No skull fracture

Sinuses/Orbits: No acute orbital abnormality. Visualized paranasal
sinuses are clear.

Other: Mastoid air cells and middle ear cavities are clear.
Degenerative changes upper cervical spine incompletely assessed.
IMPRESSION: 1. No acute intracranial abnormality.
2. Chronic microvascular changes.
3. Mild global atrophy.

## 2020-02-08 ENCOUNTER — Inpatient Hospital Stay (HOSPITAL_COMMUNITY)
Admission: EM | Admit: 2020-02-08 | Discharge: 2020-02-17 | DRG: 871 | Disposition: A | Discharge status: Home / Self Care | Payer: Medicare Other | Attending: Internal Medicine | Admitting: Internal Medicine

## 2020-02-08 ENCOUNTER — Other Ambulatory Visit: Payer: Self-pay

## 2020-02-08 ENCOUNTER — Emergency Department (HOSPITAL_COMMUNITY): Payer: Medicare Other

## 2020-02-08 ENCOUNTER — Encounter (HOSPITAL_COMMUNITY): Payer: Self-pay | Admitting: Emergency Medicine

## 2020-02-08 DIAGNOSIS — I5043 Acute on chronic combined systolic (congestive) and diastolic (congestive) heart failure: Secondary | ICD-10-CM | POA: Diagnosis not present

## 2020-02-08 DIAGNOSIS — E7800 Pure hypercholesterolemia, unspecified: Secondary | ICD-10-CM | POA: Diagnosis present

## 2020-02-08 DIAGNOSIS — Z833 Family history of diabetes mellitus: Secondary | ICD-10-CM

## 2020-02-08 DIAGNOSIS — R197 Diarrhea, unspecified: Secondary | ICD-10-CM

## 2020-02-08 DIAGNOSIS — I5042 Chronic combined systolic (congestive) and diastolic (congestive) heart failure: Secondary | ICD-10-CM | POA: Diagnosis not present

## 2020-02-08 DIAGNOSIS — I951 Orthostatic hypotension: Secondary | ICD-10-CM | POA: Diagnosis present

## 2020-02-08 DIAGNOSIS — J9601 Acute respiratory failure with hypoxia: Secondary | ICD-10-CM | POA: Diagnosis not present

## 2020-02-08 DIAGNOSIS — J181 Lobar pneumonia, unspecified organism: Secondary | ICD-10-CM

## 2020-02-08 DIAGNOSIS — E785 Hyperlipidemia, unspecified: Secondary | ICD-10-CM | POA: Diagnosis present

## 2020-02-08 DIAGNOSIS — G4730 Sleep apnea, unspecified: Secondary | ICD-10-CM | POA: Diagnosis present

## 2020-02-08 DIAGNOSIS — I2510 Atherosclerotic heart disease of native coronary artery without angina pectoris: Secondary | ICD-10-CM | POA: Diagnosis present

## 2020-02-08 DIAGNOSIS — Z955 Presence of coronary angioplasty implant and graft: Secondary | ICD-10-CM

## 2020-02-08 DIAGNOSIS — I255 Ischemic cardiomyopathy: Secondary | ICD-10-CM | POA: Diagnosis present

## 2020-02-08 DIAGNOSIS — Z87891 Personal history of nicotine dependence: Secondary | ICD-10-CM

## 2020-02-08 DIAGNOSIS — Z8679 Personal history of other diseases of the circulatory system: Secondary | ICD-10-CM

## 2020-02-08 DIAGNOSIS — R918 Other nonspecific abnormal finding of lung field: Secondary | ICD-10-CM | POA: Diagnosis present

## 2020-02-08 DIAGNOSIS — R140 Abdominal distension (gaseous): Secondary | ICD-10-CM

## 2020-02-08 DIAGNOSIS — N179 Acute kidney failure, unspecified: Secondary | ICD-10-CM | POA: Diagnosis present

## 2020-02-08 DIAGNOSIS — K219 Gastro-esophageal reflux disease without esophagitis: Secondary | ICD-10-CM | POA: Diagnosis present

## 2020-02-08 DIAGNOSIS — G4733 Obstructive sleep apnea (adult) (pediatric): Secondary | ICD-10-CM | POA: Diagnosis present

## 2020-02-08 DIAGNOSIS — Z7901 Long term (current) use of anticoagulants: Secondary | ICD-10-CM

## 2020-02-08 DIAGNOSIS — Z20822 Contact with and (suspected) exposure to covid-19: Secondary | ICD-10-CM | POA: Diagnosis present

## 2020-02-08 DIAGNOSIS — E1142 Type 2 diabetes mellitus with diabetic polyneuropathy: Secondary | ICD-10-CM | POA: Diagnosis present

## 2020-02-08 DIAGNOSIS — Z8261 Family history of arthritis: Secondary | ICD-10-CM

## 2020-02-08 DIAGNOSIS — Z79899 Other long term (current) drug therapy: Secondary | ICD-10-CM

## 2020-02-08 DIAGNOSIS — Z8249 Family history of ischemic heart disease and other diseases of the circulatory system: Secondary | ICD-10-CM

## 2020-02-08 DIAGNOSIS — E860 Dehydration: Secondary | ICD-10-CM | POA: Diagnosis present

## 2020-02-08 DIAGNOSIS — K529 Noninfective gastroenteritis and colitis, unspecified: Secondary | ICD-10-CM | POA: Diagnosis present

## 2020-02-08 DIAGNOSIS — R7402 Elevation of levels of lactic acid dehydrogenase (LDH): Secondary | ICD-10-CM

## 2020-02-08 DIAGNOSIS — Z8674 Personal history of sudden cardiac arrest: Secondary | ICD-10-CM

## 2020-02-08 DIAGNOSIS — IMO0002 Reserved for concepts with insufficient information to code with codable children: Secondary | ICD-10-CM

## 2020-02-08 DIAGNOSIS — I10 Essential (primary) hypertension: Secondary | ICD-10-CM | POA: Diagnosis present

## 2020-02-08 DIAGNOSIS — M549 Dorsalgia, unspecified: Secondary | ICD-10-CM | POA: Diagnosis present

## 2020-02-08 DIAGNOSIS — E1165 Type 2 diabetes mellitus with hyperglycemia: Secondary | ICD-10-CM

## 2020-02-08 DIAGNOSIS — K567 Ileus, unspecified: Secondary | ICD-10-CM | POA: Diagnosis not present

## 2020-02-08 DIAGNOSIS — E1140 Type 2 diabetes mellitus with diabetic neuropathy, unspecified: Secondary | ICD-10-CM | POA: Diagnosis present

## 2020-02-08 DIAGNOSIS — A419 Sepsis, unspecified organism: Principal | ICD-10-CM | POA: Diagnosis present

## 2020-02-08 DIAGNOSIS — I480 Paroxysmal atrial fibrillation: Secondary | ICD-10-CM | POA: Diagnosis present

## 2020-02-08 DIAGNOSIS — G8929 Other chronic pain: Secondary | ICD-10-CM | POA: Diagnosis present

## 2020-02-08 DIAGNOSIS — E861 Hypovolemia: Secondary | ICD-10-CM

## 2020-02-08 DIAGNOSIS — I25119 Atherosclerotic heart disease of native coronary artery with unspecified angina pectoris: Secondary | ICD-10-CM | POA: Diagnosis present

## 2020-02-08 DIAGNOSIS — R55 Syncope and collapse: Secondary | ICD-10-CM | POA: Diagnosis present

## 2020-02-08 DIAGNOSIS — Z7984 Long term (current) use of oral hypoglycemic drugs: Secondary | ICD-10-CM

## 2020-02-08 DIAGNOSIS — Z888 Allergy status to other drugs, medicaments and biological substances status: Secondary | ICD-10-CM

## 2020-02-08 DIAGNOSIS — R269 Unspecified abnormalities of gait and mobility: Secondary | ICD-10-CM | POA: Diagnosis present

## 2020-02-08 DIAGNOSIS — I959 Hypotension, unspecified: Secondary | ICD-10-CM | POA: Diagnosis present

## 2020-02-08 DIAGNOSIS — I252 Old myocardial infarction: Secondary | ICD-10-CM

## 2020-02-08 DIAGNOSIS — I110 Hypertensive heart disease with heart failure: Secondary | ICD-10-CM | POA: Diagnosis present

## 2020-02-08 DIAGNOSIS — E1149 Type 2 diabetes mellitus with other diabetic neurological complication: Secondary | ICD-10-CM | POA: Diagnosis present

## 2020-02-08 DIAGNOSIS — R202 Paresthesia of skin: Secondary | ICD-10-CM | POA: Diagnosis not present

## 2020-02-08 LAB — URINALYSIS, ROUTINE W REFLEX MICROSCOPIC
Bilirubin Urine: NEGATIVE
Glucose, UA: >=500 mg/dL — AB
Ketones, ur: NEGATIVE mg/dL
Leukocytes,Ua: NEGATIVE
Nitrite: NEGATIVE
Protein, ur: NEGATIVE mg/dL
RBC / HPF: >50 RBC/hpf — ABNORMAL HIGH (ref 0–5)
Specific Gravity, Urine: 1.011 (ref 1.005–1.030)
pH: 6 (ref 5.0–8.0)

## 2020-02-08 LAB — POC OCCULT BLOOD, ED: Fecal Occult Bld: POSITIVE — AB

## 2020-02-08 LAB — LACTIC ACID, PLASMA
Lactic Acid, Venous: 2.9 mmol/L (ref 0.5–1.9)
Lactic Acid, Venous: 2.4 mmol/L (ref 0.5–1.9)
Lactic Acid, Venous: 1.5 mmol/L (ref 0.5–1.9)

## 2020-02-08 LAB — TYPE AND SCREEN
ABO/RH(D): AB POS
Antibody Screen: NEGATIVE

## 2020-02-08 LAB — COMPREHENSIVE METABOLIC PANEL
ALT: 22 U/L (ref 0–44)
AST: 23 U/L (ref 15–41)
Albumin: 2.8 g/dL — ABNORMAL LOW (ref 3.5–5.0)
Alkaline Phosphatase: 72 U/L (ref 38–126)
Anion gap: 13 (ref 5–15)
BUN: 34 mg/dL — ABNORMAL HIGH (ref 8–23)
CO2: 24 mmol/L (ref 22–32)
Calcium: 8.9 mg/dL (ref 8.9–10.3)
Chloride: 103 mmol/L (ref 98–111)
Creatinine, Ser: 1.77 mg/dL — ABNORMAL HIGH (ref 0.61–1.24)
GFR calc Af Amer: 41 mL/min — ABNORMAL LOW (ref >60)
GFR calc non Af Amer: 35 mL/min — ABNORMAL LOW (ref >60)
Glucose, Bld: 268 mg/dL — ABNORMAL HIGH (ref 70–99)
Potassium: 4.5 mmol/L (ref 3.5–5.1)
Sodium: 140 mmol/L (ref 135–145)
Total Bilirubin: 0.9 mg/dL (ref 0.3–1.2)
Total Protein: 6.6 g/dL (ref 6.5–8.1)

## 2020-02-08 LAB — CBC
HCT: 42.6 % (ref 39.0–52.0)
Hemoglobin: 14.4 g/dL (ref 13.0–17.0)
MCH: 31.8 pg (ref 26.0–34.0)
MCHC: 33.8 g/dL (ref 30.0–36.0)
MCV: 94 fL (ref 80.0–100.0)
Platelets: 249 10*3/uL (ref 150–400)
RBC: 4.53 MIL/uL (ref 4.22–5.81)
RDW: 13.9 % (ref 11.5–15.5)
WBC: 9 10*3/uL (ref 4.0–10.5)
nRBC: 0 % (ref 0.0–0.2)

## 2020-02-08 LAB — RESPIRATORY PANEL BY RT PCR (FLU A&B, COVID)
Influenza A by PCR: NEGATIVE
Influenza B by PCR: NEGATIVE
SARS Coronavirus 2 by RT PCR: NEGATIVE

## 2020-02-08 LAB — CBG MONITORING, ED: Glucose-Capillary: 232 mg/dL — ABNORMAL HIGH (ref 70–99)

## 2020-02-08 MED ORDER — MUSCLE RUB 10-15 % EX CREA
1.0000 "application " | TOPICAL_CREAM | CUTANEOUS | Status: DC | PRN
  Administered 2020-02-09: 1 via TOPICAL
  Filled 2020-02-08: qty 85

## 2020-02-08 MED ORDER — ADULT MULTIVITAMIN W/MINERALS CH
1.0000 | ORAL_TABLET | Freq: Every day | ORAL | Status: DC
  Administered 2020-02-09 – 2020-02-17 (×9): 1 via ORAL
  Filled 2020-02-08 (×10): qty 1

## 2020-02-08 MED ORDER — DIAZEPAM 2 MG PO TABS: 5.0000 mg | ORAL_TABLET | Freq: Every evening | ORAL | Status: DC | PRN

## 2020-02-08 MED ORDER — VITAMIN B-12 1000 MCG PO TABS
1000.0000 ug | ORAL_TABLET | Freq: Every day | ORAL | Status: DC
  Administered 2020-02-09 – 2020-02-17 (×9): 1000 ug via ORAL
  Filled 2020-02-08 (×9): qty 1

## 2020-02-08 MED ORDER — PANTOPRAZOLE SODIUM 40 MG PO TBEC
40.0000 mg | DELAYED_RELEASE_TABLET | Freq: Every day | ORAL | Status: DC
  Administered 2020-02-09 – 2020-02-17 (×9): 40 mg via ORAL
  Filled 2020-02-08 (×9): qty 1

## 2020-02-08 MED ORDER — RAMIPRIL 5 MG PO CAPS
5.0000 mg | ORAL_CAPSULE | Freq: Every day | ORAL | Status: DC
  Filled 2020-02-08: qty 1

## 2020-02-08 MED ORDER — FUROSEMIDE 20 MG PO TABS: 40.0000 mg | ORAL_TABLET | Freq: Every day | ORAL | Status: DC | PRN

## 2020-02-08 MED ORDER — POTASSIUM CHLORIDE CRYS ER 10 MEQ PO TBCR
10.0000 meq | EXTENDED_RELEASE_TABLET | Freq: Every day | ORAL | Status: DC
  Administered 2020-02-09 – 2020-02-13 (×5): 10 meq via ORAL
  Filled 2020-02-08 (×5): qty 1

## 2020-02-08 MED ORDER — ATORVASTATIN CALCIUM 10 MG PO TABS
10.0000 mg | ORAL_TABLET | Freq: Every day | ORAL | Status: DC
  Administered 2020-02-09 – 2020-02-17 (×9): 10 mg via ORAL
  Filled 2020-02-08 (×9): qty 1

## 2020-02-08 MED ORDER — CANAGLIFLOZIN 100 MG PO TABS
100.0000 mg | ORAL_TABLET | Freq: Every day | ORAL | Status: DC
  Administered 2020-02-09: 100 mg via ORAL
  Filled 2020-02-08: qty 1

## 2020-02-08 MED ORDER — CO Q 10 100 MG PO CAPS: 100.0000 mg | ORAL_CAPSULE | Freq: Every day | ORAL | Status: DC

## 2020-02-08 MED ORDER — RIVAROXABAN 20 MG PO TABS: 20.0000 mg | ORAL_TABLET | Freq: Every day | ORAL | Status: DC

## 2020-02-08 MED ORDER — METOPROLOL TARTRATE 50 MG PO TABS
50.0000 mg | ORAL_TABLET | Freq: Two times a day (BID) | ORAL | Status: DC
  Administered 2020-02-09 – 2020-02-10 (×3): 50 mg via ORAL
  Filled 2020-02-08 (×3): qty 1

## 2020-02-08 MED ORDER — SODIUM CHLORIDE 0.9 % IV SOLN
2.0000 g | INTRAVENOUS | Status: DC
  Filled 2020-02-08: qty 2

## 2020-02-08 MED ORDER — METFORMIN HCL ER 500 MG PO TB24: 500.0000 mg | ORAL_TABLET | ORAL | Status: DC

## 2020-02-08 MED ORDER — OXYCODONE-ACETAMINOPHEN 5-325 MG PO TABS
1.0000 | ORAL_TABLET | Freq: Four times a day (QID) | ORAL | Status: DC | PRN
  Administered 2020-02-09 – 2020-02-11 (×3): 1 via ORAL
  Filled 2020-02-08 (×3): qty 1

## 2020-02-08 MED ORDER — SODIUM CHLORIDE 0.9 % IV BOLUS
1000.0000 mL | Freq: Once | INTRAVENOUS | Status: AC
  Administered 2020-02-08: 1000 mL via INTRAVENOUS

## 2020-02-08 MED ORDER — OLOPATADINE HCL 0.1 % OP SOLN
1.0000 [drp] | Freq: Every day | OPHTHALMIC | Status: DC
  Administered 2020-02-09 – 2020-02-17 (×9): 1 [drp] via OPHTHALMIC
  Filled 2020-02-08 (×3): qty 5

## 2020-02-08 MED ORDER — GLIMEPIRIDE 1 MG PO TABS
1.0000 mg | ORAL_TABLET | Freq: Every day | ORAL | Status: DC
  Filled 2020-02-08: qty 1

## 2020-02-08 MED ORDER — METRONIDAZOLE IN NACL 5-0.79 MG/ML-% IV SOLN
500.0000 mg | Freq: Once | INTRAVENOUS | Status: AC
  Administered 2020-02-08: 500 mg via INTRAVENOUS
  Filled 2020-02-08: qty 100

## 2020-02-08 MED ORDER — PREGABALIN 100 MG PO CAPS
300.0000 mg | ORAL_CAPSULE | Freq: Two times a day (BID) | ORAL | Status: DC
  Administered 2020-02-09 – 2020-02-17 (×17): 300 mg via ORAL
  Filled 2020-02-08 (×8): qty 3
  Filled 2020-02-08: qty 6
  Filled 2020-02-08 (×3): qty 3
  Filled 2020-02-08: qty 6
  Filled 2020-02-08 (×4): qty 3

## 2020-02-08 MED ORDER — SODIUM CHLORIDE 0.9 % IV SOLN
2.0000 g | Freq: Once | INTRAVENOUS | Status: AC
  Administered 2020-02-08: 2 g via INTRAVENOUS
  Filled 2020-02-08: qty 2

## 2020-02-08 MED ORDER — AMITRIPTYLINE HCL 25 MG PO TABS
25.0000 mg | ORAL_TABLET | Freq: Every day | ORAL | Status: DC
  Administered 2020-02-09 – 2020-02-16 (×7): 25 mg via ORAL
  Filled 2020-02-08 (×7): qty 1

## 2020-02-08 NOTE — ED Provider Notes (Signed)
[facility] EMERGENCY DEPARTMENT Provider Note   CSN: [id number] Arrival date & time: 02/08/20  1559     History Chief Complaint  Patient presents with  . Hypotension  . Loss of Consciousness  . Diarrhea    [name] is a 79 y.o. male.  Patient with hx cad, presents via EMS, with liquid brown stool, and acute onset feeling faint, as if will pass out. Did have brief syncopal event - no trauma/injury. EMS notes persistent liquid brown stool in route, and bp low. Pt indicates felt fine earlier today, and then around 2 pm urge to have bm with lightheadedness and weakness. Denies pain. No headache. No cp or discomfort. No abd pain. No vomiting. No recent blood loss, rectal bleeding or melena. No known bad food ingestion, ill contacts, or recent antibiotic use. No dysuria or gu c/o. No fever or chill.   The history is provided by the patient, the EMS personnel and a relative.       Past Medical History:  Diagnosis Date  . AAA (abdominal aortic aneurysm) ([place])    a. s/p repair 2009.  [name] Acid reflux    takes Prilosec and Protonix daily  . Arthritis    BacK  . CAD in native artery    a. a. prior inf MI 1993. b. PCI to LAD 05/1997. c. recurrent inferolateral MI complicated by V. fib arrest in 04/1998, prior PCI to OM1. d. 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  [name] Chronic combined systolic and diastolic CHF (congestive heart failure) ([place])   . Complication of anesthesia    -years ago hair fell out.;ileus after 2 of his surgeries  . Dry skin   . Foot drop, bilateral   . Hypercholesteremia   . Hypertension   . Ileus ([place])    After AAA  . Incisional hernia    "small, from AAA"  . Ischemic cardiomyopathy   . MI (myocardial infarction) ([place])   . 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 OSA on CPAP    uses CPAP--sleep study done at least 27yrs ago  . PAF (paroxysmal atrial fibrillation) ([place])   . Pain    back pain chronic- seen at pain clinic  . Plantar fasciitis     bilatetral  . Thrombocytopenia ([place])   . Type II diabetes mellitus [facility])     Patient Active Problem List   Diagnosis Date Noted  . Hypokalemia 07/08/2019  . Acute metabolic encephalopathy   . Orthostatic dizziness   . Postural dizziness with near syncope 07/04/2019  . Abdominal distention, non-gaseous 07/04/2019  . Change in mental status 07/04/2019  . Small bowel obstruction ([place]) 07/04/2019  . Postural dizziness with presyncope 05/09/2018  . Hypotension 05/08/2018  . On amiodarone therapy 08/26/2017  . Near syncope 06/11/2017  . Hypoxia   . Pneumonia of left lower lobe due to infectious organism   . Gastroenteritis and colitis, viral 04/27/2016  . Orthostasis 04/26/2016  . Lumbar radiculopathy, chronic 01/16/2016  . Hematoma of left kidney 11/01/2015  . Hyperlipidemia 12/12/2014  . SIRS (systemic inflammatory response syndrome) ([place]) 12/12/2014  . Influenza due to identified novel influenza A virus with other respiratory manifestations 11/07/2013  . Long term current use of anticoagulant therapy 09/08/2013    Class: Chronic  . Coronary artery disease involving native coronary artery of native heart with angina pectoris ([place]) 08/16/2013    Class: Chronic  . Chronic combined systolic and diastolic heart failure ([place]) 08/16/2013  Class: Chronic  . Paroxysmal atrial fibrillation with RVR ([place]) 08/13/2013  . Paralytic ileus ([place]) 05/07/2012  . Nausea and vomiting 01/01/2012  . DM (diabetes mellitus), type 2, uncontrolled w/neurologic complication ([place]) 76/19/5093  . Hypertension   . AAA (abdominal aortic aneurysm) ([place])   . Acid reflux   . Sleep apnea     Past Surgical History:  Procedure Laterality Date  . ABDOMINAL AORTIC ANEURYSM REPAIR    . BACK SURGERY  2012-2013 X 3   Miminal Invasive x 3in [place].  [name] CARDIAC CATHETERIZATION  2009/2012  . CARDIOVERSION N/A 04/12/2015   Procedure: CARDIOVERSION;  Surgeon: Sueanne Margarita, MD;  Location: [facility];  Service:  Cardiovascular;  Laterality: N/A;  . CARDIOVERSION N/A 12/26/2016   Procedure: CARDIOVERSION;  Surgeon: Sueanne Margarita, MD;  Location: [facility];  Service: Cardiovascular;  Laterality: N/A;  . COLONOSCOPY    . CORONARY ANGIOPLASTY WITH STENT PLACEMENT  1992; 1997  . LAPAROSCOPIC CHOLECYSTECTOMY    . LUMBAR LAMINECTOMY/DECOMPRESSION MICRODISCECTOMY Right 01/16/2016   Procedure: Right Lumbar five-Sacral one Laminectomy;  Surgeon: Kristeen Miss, MD;  Location: [facility];  Service: Neurosurgery;  Laterality: Right;  Right L5-S1 Laminectomy       Family History  Problem Relation Age of Onset  . Diabetes Mother   . Coronary artery disease Mother   . Diabetes Father   . Arthritis Father   . Rheum arthritis Father   . Coronary artery disease Father     Social History   Tobacco Use  . Smoking status: Former Smoker    Packs/day: 1.00    Years: 33.00    Pack years: 33.00    Types: Cigarettes    Quit date: 01/01/1992    Years since quitting: 28.1  . Smokeless tobacco: Never Used  Substance Use Topics  . Alcohol use: Yes    Comment: 6/30;2017 "GLASS OF WINE Q COUPLE MONTHS, IF THAT"  . Drug use: No    Home Medications Prior to Admission medications   Medication Sig Start Date End Date Taking? Authorizing Provider  amiodarone (PACERONE) 200 MG tablet Take 1 tablet by mouth once daily Patient taking differently: Take 200 mg by mouth daily.  06/03/19  Yes Belva Crome, MD  amitriptyline (ELAVIL) 25 MG tablet Take 25 mg by mouth at bedtime.  12/11/18  Yes [provider]  atorvastatin (LIPITOR) 10 MG tablet Take 10 mg by mouth daily.   Yes [provider]  Coenzyme Q10 (CO Q 10) 100 MG CAPS Take 100 mg by mouth daily.   Yes [provider]  diazepam (VALIUM) 5 MG tablet Take 5 mg by mouth at bedtime as needed for anxiety.  10/31/15  Yes [provider]  empagliflozin (JARDIANCE) 10 MG TABS tablet Take 10 mg by mouth daily with breakfast.    Yes  [provider]  furosemide (LASIX) 40 MG tablet Take 40 mg by mouth daily as needed for fluid.  01/11/20  Yes [provider]  glimepiride (AMARYL) 1 MG tablet Take 1 mg by mouth daily with breakfast.   Yes [provider]  metFORMIN (GLUCOPHAGE-XR) 500 MG 24 hr tablet Take 500-1,000 mg by mouth See admin instructions. 1,000 mg in the morning and 500 mg in the evening 10/12/15  Yes [provider]  metoprolol tartrate (LOPRESSOR) 50 MG tablet Take 1 tablet by mouth twice daily Patient taking differently: Take 50 mg by mouth in the morning and at bedtime.  11/23/19  Yes Daneen Schick  W, MD  Multiple Vitamins-Minerals (ONE-A-DAY MENS 50+ ADVANTAGE) TABS Take 1 tablet by mouth daily.   Yes [provider]  Olopatadine HCl (PATADAY OP) Place 1 drop into both eyes daily.   Yes [provider]  omeprazole (PRILOSEC) 20 MG capsule Take 20 mg by mouth daily. 09/14/12  Yes [provider]  oxyCODONE-acetaminophen (PERCOCET/ROXICET) 5-325 MG tablet Take 1 tablet by mouth every 6 (six) hours as needed (pain).    Yes [provider]  potassium chloride (KLOR-CON) 10 MEQ tablet Take 10 mEq by mouth daily. 01/11/20  Yes [provider]  pregabalin (LYRICA) 300 MG capsule Take 1 capsule by mouth 2 (two) times a day. 02/16/19  Yes [provider]  ramipril (ALTACE) 5 MG capsule Take 1 capsule (5 mg total) by mouth daily. 06/02/18  Yes Burtis Junes, NP  rivaroxaban (XARELTO) 20 MG TABS tablet Take 20 mg by mouth daily with supper.   Yes [provider]  trolamine salicylate (ASPERCREME) 10 % cream Apply 1 application topically as needed for muscle pain.   Yes [provider]  vitamin B-12 (CYANOCOBALAMIN) 1000 MCG tablet Take 1,000 mcg by mouth daily.    Yes [provider]  docusate sodium (COLACE) 100 MG capsule Take 1 capsule (100 mg total) by mouth 2 (two) times daily. Patient not taking: Reported  on 02/08/2020 07/09/19   Jani Gravel, MD  polyethylene glycol (MIRALAX / GLYCOLAX) 17 g packet Take 17 g by mouth daily as needed for moderate constipation. Patient not taking: Reported on 02/08/2020 07/09/19   Jani Gravel, MD    Allergies    Cymbalta [duloxetine hcl], Neurontin [gabapentin], and Keppra [levetiracetam]  Review of Systems   Review of Systems  Constitutional: Negative for fever.  HENT: Negative for sore throat.   Eyes: Negative for redness.  Respiratory: Negative for cough and shortness of breath.   Cardiovascular: Negative for chest pain.  Gastrointestinal: Positive for diarrhea. Negative for abdominal pain.  Endocrine: Negative for polyuria.  Genitourinary: Negative for dysuria and flank pain.  Musculoskeletal: Negative for back pain and neck pain.  Skin: Negative for rash.  Neurological: Positive for syncope and light-headedness. Negative for headaches.  Hematological: Does not bruise/bleed easily.  Psychiatric/Behavioral: Negative for confusion.    Physical Exam Updated Vital Signs BP (!) 115/94   Pulse 74   Temp 97.7 F (36.5 C) (Rectal)   Resp 17   SpO2 98%   Physical Exam Vitals and nursing note reviewed.  Constitutional:      Appearance: Normal appearance. He is well-developed.  HENT:     Head: Atraumatic.     Nose: Nose normal.     Mouth/Throat:     Mouth: Mucous membranes are moist.     Pharynx: Oropharynx is clear.  Eyes:     General: No scleral icterus.    Conjunctiva/sclera: Conjunctivae normal.     Pupils: Pupils are equal, round, and reactive to light.  Neck:     Trachea: No tracheal deviation.  Cardiovascular:     Rate and Rhythm: Normal rate and regular rhythm.     Pulses: Normal pulses.     Heart sounds: Normal heart sounds. No murmur. No friction rub. No gallop.   Pulmonary:     Effort: Pulmonary effort is normal. No accessory muscle usage or respiratory distress.     Breath sounds: Normal breath sounds.  Abdominal:     General:  Bowel sounds are normal. There is no distension.  Palpations: Abdomen is soft. There is no mass.     Tenderness: There is no abdominal tenderness. There is no guarding or rebound.     Hernia: No hernia is present.  Genitourinary:    Comments: No cva tenderness. Having another stool now, liquid brown.  Musculoskeletal:        General: No swelling.     Cervical back: Normal range of motion and neck supple. No rigidity.  Skin:    General: Skin is warm and dry.     Findings: No rash.  Neurological:     Mental Status: He is alert.     Comments: Alert, speech clear.   Psychiatric:        Mood and Affect: Mood normal.     ED Results / Procedures / Treatments   Labs (all labs ordered are listed, but only abnormal results are displayed) Results for orders placed or performed during the hospital encounter of 02/08/20  Respiratory Panel by RT PCR (Flu A&B, Covid) - Nasopharyngeal Swab   Specimen: Nasopharyngeal Swab  Result Value Ref Range   SARS Coronavirus 2 by RT PCR NEGATIVE NEGATIVE   Influenza A by PCR NEGATIVE NEGATIVE   Influenza B by PCR NEGATIVE NEGATIVE  Comprehensive metabolic panel  Result Value Ref Range   Sodium 140 135 - 145 mmol/L   Potassium 4.5 3.5 - 5.1 mmol/L   Chloride 103 98 - 111 mmol/L   CO2 24 22 - 32 mmol/L   Glucose, Bld 268 (H) 70 - 99 mg/dL   BUN 34 (H) 8 - 23 mg/dL   Creatinine, Ser 1.77 (H) 0.61 - 1.24 mg/dL   Calcium 8.9 8.9 - 10.3 mg/dL   Total Protein 6.6 6.5 - 8.1 g/dL   Albumin 2.8 (L) 3.5 - 5.0 g/dL   AST 23 15 - 41 U/L   ALT 22 0 - 44 U/L   Alkaline Phosphatase 72 38 - 126 U/L   Total Bilirubin 0.9 0.3 - 1.2 mg/dL   GFR calc non Af Amer 35 (L) >60 mL/min   GFR calc Af Amer 41 (L) >60 mL/min   Anion gap 13 5 - 15  CBC  Result Value Ref Range   WBC 9.0 4.0 - 10.5 K/uL   RBC 4.53 4.22 - 5.81 MIL/uL   Hemoglobin 14.4 13.0 - 17.0 g/dL   HCT 42.6 39.0 - 52.0 %   MCV 94.0 80.0 - 100.0 fL   MCH 31.8 26.0 - 34.0 pg   MCHC 33.8 30.0 - 36.0  g/dL   RDW 13.9 11.5 - 15.5 %   Platelets 249 150 - 400 K/uL   nRBC 0.0 0.0 - 0.2 %  Urinalysis, Routine w reflex microscopic  Result Value Ref Range   Color, Urine YELLOW YELLOW   APPearance CLEAR CLEAR   Specific Gravity, Urine 1.011 1.005 - 1.030   pH 6.0 5.0 - 8.0   Glucose, UA >=500 (A) NEGATIVE mg/dL   Hgb urine dipstick LARGE (A) NEGATIVE   Bilirubin Urine NEGATIVE NEGATIVE   Ketones, ur NEGATIVE NEGATIVE mg/dL   Protein, ur NEGATIVE NEGATIVE mg/dL   Nitrite NEGATIVE NEGATIVE   Leukocytes,Ua NEGATIVE NEGATIVE   RBC / HPF >50 (H) 0 - 5 RBC/hpf   WBC, UA 0-5 0 - 5 WBC/hpf   Bacteria, UA RARE (A) NONE SEEN   Hyaline Casts, UA PRESENT   Lactic acid, plasma  Result Value Ref Range   Lactic Acid, Venous 2.9 (HH) 0.5 - 1.9 mmol/L  Lactic acid,  plasma  Result Value Ref Range   Lactic Acid, Venous 2.4 (HH) 0.5 - 1.9 mmol/L  POC occult blood, ED Provider will collect  Result Value Ref Range   Fecal Occult Bld POSITIVE (A) NEGATIVE  CBG monitoring, ED  Result Value Ref Range   Glucose-Capillary 232 (H) 70 - 99 mg/dL  Type and screen  Result Value Ref Range   ABO/RH(D) AB POS    Antibody Screen NEG    Sample Expiration      02/11/2020,2359 Performed at [facility] [address], [place], [place] [address]    DG Chest Port 1 View  Result Date: 02/08/2020 CLINICAL DATA:  Weakness, hypotension EXAM: PORTABLE CHEST 1 VIEW COMPARISON:  Radiograph 09/30/2012 FINDINGS: Increasing hazy interstitial opacities within the lungs cardiomegaly and indistinct pulmonary vascularity may reflect mild interstitial edema. No consolidative opacity, pneumothorax or effusion. The aorta is calcified. The remaining cardiomediastinal contours are unremarkable. No acute osseous or soft tissue abnormality. Degenerative changes are present in the imaged spine and shoulders. Telemetry leads overlie the chest. IMPRESSION: Features compatible with CHF/volume overload with cardiomegaly and  interstitial opacities favoring edema. Electronically Signed   By: Lovena Le M.D.   On: 02/08/2020 17:23   CT Renal Stone Study  Result Date: 02/08/2020 CLINICAL DATA:  Syncopal event EXAM: CT ABDOMEN AND PELVIS WITHOUT CONTRAST TECHNIQUE: Multidetector CT imaging of the abdomen and pelvis was performed following the standard protocol without IV contrast. COMPARISON:  Radiograph 07/08/2019, CT 07/04/2019 FINDINGS: Lower chest: There is a dense consolidative opacity in the left lung base with trace left pleural effusion and some surrounding atelectasis. The right lung base is predominantly clear aside from some basilar atelectatic changes as well. Normal heart size. No pericardial effusion. Extensive coronary artery calcifications. Hepatobiliary: No focal liver abnormality is seen. Patient is post cholecystectomy. Slight prominence of the biliary tree likely related to reservoir effect. No calcified intraductal gallstones. Pancreas: Unremarkable. No pancreatic ductal dilatation or surrounding inflammatory changes. Spleen: Normal in size without focal abnormality. Adrenals/Urinary Tract: Normal adrenal glands. Mild bilateral symmetric perinephric stranding, a nonspecific finding though may correlate with either age or decreased renal function. Asymmetric left renal atrophy with extensive areas of cortical scarring. Several fluid attenuation cysts are noted in the right kidney as well as a more intermediate partially exophytic 2.2 cm lesion arising from the posterior interpolar right kidney (3/45). Nonobstructing calculus present in the collecting system of the left lower pole. Additional vascular calcifications noted in both renal pelves. No hydronephrosis or obstructive urolithiasis. Mild bladder distension without other gross abnormality. Stomach/Bowel: Distal esophagus, stomach and duodenal sweep are unremarkable. No small bowel wall thickening or dilatation. No evidence of obstruction. A normal appendix is  visualized. Proximal colon is unremarkable. There is a focally thickened segment of the distal descending colon and proximal sigmoid with surrounding pericolonic fat stranding which does not appear centered upon a focal diverticulum. Vascular/Lymphatic: Extensive atherosclerotic calcification of the abdominal aorta and branch vessels. Slight fusiform ectasia of the infrarenal abdominal aorta is likely related to prior abdominal aortic aneurysm repair incompletely assessed on this exam examination in the absence of intravenous contrast media. Some reactive adenopathy noted in the low pelvis and mesentery. Reproductive: Prostate at the upper limits of normal for size. No acute CT abnormality. Other: No bowel containing hernias. No abdominopelvic free air. Small volume free fluid in the pelvis, favor reactive given simple attenuation (-1 HU). Musculoskeletal: Prior L3-L5 posterior spinal decompression and fusion with interbody spacer placement. No  evidence of acute hardware complication or failure. IMPRESSION: 1. Focally thickened segment of the distal descending colon and proximal sigmoid with surrounding pericolonic fat stranding which does not appear centered upon a focal diverticulum. Findings are favored to represent an acute infectious, inflammatory or vascular colitis. 2. Small volume free fluid in the pelvis, favor reactive given simple attenuation (-1 HU). 3. Dense consolidative opacity in the left lung base with trace left pleural effusion and some surrounding atelectasis. Findings are concerning for pneumonia. Recommend follow-up chest CT in 6-8 weeks following appropriate therapy to ensure resolution. 4. Asymmetric left renal atrophy with extensive areas of cortical scarring. Nonobstructing left nephrolithiasis. 5. Extensive coronary artery calcifications. 6. Postsurgical changes of prior abdominal aortic aneurysm repair incompletely assessed on this exam in the absence of intravenous contrast media. 7.  Prior L3-L5 posterior spinal decompression and fusion with interbody spacer placement. No evidence of acute hardware complication or failure. 8. Aortic Atherosclerosis (ICD10-I70.0). Electronically Signed   By: Lovena Le M.D.   On: 02/08/2020 20:27    EKG EKG Interpretation  Date/Time:  Tuesday February 08 2020 16:21:28 EDT Ventricular Rate:  67 PR Interval:    QRS Duration: 127 QT Interval:  445 QTC Calculation: 470 R Axis:   -68 Text Interpretation: Sinus rhythm Prolonged PR interval Nonspecific IVCD with LAD Confirmed by Lajean Saver [phone]) on 02/08/2020 4:33:45 PM   Radiology DG Chest Port 1 View  Result Date: 02/08/2020 CLINICAL DATA:  Weakness, hypotension EXAM: PORTABLE CHEST 1 VIEW COMPARISON:  Radiograph 09/30/2012 FINDINGS: Increasing hazy interstitial opacities within the lungs cardiomegaly and indistinct pulmonary vascularity may reflect mild interstitial edema. No consolidative opacity, pneumothorax or effusion. The aorta is calcified. The remaining cardiomediastinal contours are unremarkable. No acute osseous or soft tissue abnormality. Degenerative changes are present in the imaged spine and shoulders. Telemetry leads overlie the chest. IMPRESSION: Features compatible with CHF/volume overload with cardiomegaly and interstitial opacities favoring edema. Electronically Signed   By: Lovena Le M.D.   On: 02/08/2020 17:23   CT Renal Stone Study  Result Date: 02/08/2020 CLINICAL DATA:  Syncopal event EXAM: CT ABDOMEN AND PELVIS WITHOUT CONTRAST TECHNIQUE: Multidetector CT imaging of the abdomen and pelvis was performed following the standard protocol without IV contrast. COMPARISON:  Radiograph 07/08/2019, CT 07/04/2019 FINDINGS: Lower chest: There is a dense consolidative opacity in the left lung base with trace left pleural effusion and some surrounding atelectasis. The right lung base is predominantly clear aside from some basilar atelectatic changes as well. Normal heart size.  No pericardial effusion. Extensive coronary artery calcifications. Hepatobiliary: No focal liver abnormality is seen. Patient is post cholecystectomy. Slight prominence of the biliary tree likely related to reservoir effect. No calcified intraductal gallstones. Pancreas: Unremarkable. No pancreatic ductal dilatation or surrounding inflammatory changes. Spleen: Normal in size without focal abnormality. Adrenals/Urinary Tract: Normal adrenal glands. Mild bilateral symmetric perinephric stranding, a nonspecific finding though may correlate with either age or decreased renal function. Asymmetric left renal atrophy with extensive areas of cortical scarring. Several fluid attenuation cysts are noted in the right kidney as well as a more intermediate partially exophytic 2.2 cm lesion arising from the posterior interpolar right kidney (3/45). Nonobstructing calculus present in the collecting system of the left lower pole. Additional vascular calcifications noted in both renal pelves. No hydronephrosis or obstructive urolithiasis. Mild bladder distension without other gross abnormality. Stomach/Bowel: Distal esophagus, stomach and duodenal sweep are unremarkable. No small bowel wall thickening or dilatation. No evidence of obstruction. A normal appendix is  visualized. Proximal colon is unremarkable. There is a focally thickened segment of the distal descending colon and proximal sigmoid with surrounding pericolonic fat stranding which does not appear centered upon a focal diverticulum. Vascular/Lymphatic: Extensive atherosclerotic calcification of the abdominal aorta and branch vessels. Slight fusiform ectasia of the infrarenal abdominal aorta is likely related to prior abdominal aortic aneurysm repair incompletely assessed on this exam examination in the absence of intravenous contrast media. Some reactive adenopathy noted in the low pelvis and mesentery. Reproductive: Prostate at the upper limits of normal for size. No  acute CT abnormality. Other: No bowel containing hernias. No abdominopelvic free air. Small volume free fluid in the pelvis, favor reactive given simple attenuation (-1 HU). Musculoskeletal: Prior L3-L5 posterior spinal decompression and fusion with interbody spacer placement. No evidence of acute hardware complication or failure. IMPRESSION: 1. Focally thickened segment of the distal descending colon and proximal sigmoid with surrounding pericolonic fat stranding which does not appear centered upon a focal diverticulum. Findings are favored to represent an acute infectious, inflammatory or vascular colitis. 2. Small volume free fluid in the pelvis, favor reactive given simple attenuation (-1 HU). 3. Dense consolidative opacity in the left lung base with trace left pleural effusion and some surrounding atelectasis. Findings are concerning for pneumonia. Recommend follow-up chest CT in 6-8 weeks following appropriate therapy to ensure resolution. 4. Asymmetric left renal atrophy with extensive areas of cortical scarring. Nonobstructing left nephrolithiasis. 5. Extensive coronary artery calcifications. 6. Postsurgical changes of prior abdominal aortic aneurysm repair incompletely assessed on this exam in the absence of intravenous contrast media. 7. Prior L3-L5 posterior spinal decompression and fusion with interbody spacer placement. No evidence of acute hardware complication or failure. 8. Aortic Atherosclerosis (ICD10-I70.0). Electronically Signed   By: Lovena Le M.D.   On: 02/08/2020 20:27    Procedures Procedures (including critical care time)  Medications Ordered in ED Medications  ceFEPIme (MAXIPIME) 2 g in sodium chloride 0.9 % 100 mL IVPB (has no administration in time range)  metroNIDAZOLE (FLAGYL) IVPB 500 mg (has no administration in time range)  sodium chloride 0.9 % bolus 1,000 mL (0 mLs Intravenous Stopped 02/08/20 1719)  sodium chloride 0.9 % bolus 1,000 mL (0 mLs Intravenous Stopped  02/08/20 1719)    ED Course  I have reviewed the triage vital signs and the nursing notes.  Pertinent labs & imaging results that were available during my care of the patient were reviewed by me and considered in my medical decision making (see chart for details).    MDM Rules/Calculators/A&P                      Iv ns boluses. Continuous pulse ox and monitor. Stat labs.   Reviewed nursing notes and prior charts for additional history.  Remote hx AAA repair.  From most recent prior office visit, pts bp then in the low 100s.   2nd iv line. bp sl improved.   Additional fluids.   Initial labs reviewed/interpreted by me  - lactate high. Pt denies fever. Had profuse watery/brown stool, ?volume depletion/dehydration. Additional ns bolus. Repeat lactate, improved from initial. Afebrile.   Given hypotension on arrival cultures added to workup.  Imaging ordered.   CXR reviewed/interpreted by me - no pna. Pt denies cough, denies fever, no sob.   Given profuse diarrhea, abd cramping, CT abd.   CT reviewed/interpreted by me - ?colitis. IV abx given.  Pt also notes consolidative process left lower chest  - again  pt denies fever, or cough. Pt did receive abx for colitis. May need f/u imaging.   Given earlier hypotension, dehydration, AKI, and suspected colitis, will admit.    Hospitalists consulted for admission - discussed labs, imaging.   MDM Number of Diagnoses or Management Options   Amount and/or Complexity of Data Reviewed Clinical lab tests: ordered and reviewed Tests in the radiology section of CPT: ordered and reviewed Tests in the medicine section of CPT: ordered and reviewed Discussion of test results with the performing providers: yes Decide to obtain previous medical records or to obtain history from someone other than the patient: yes Obtain history from someone other than the patient: yes Review and summarize past medical records: yes Discuss the patient with other  providers: yes Independent visualization of images, tracings, or specimens: yes  Risk of Complications, Morbidity, and/or Mortality Presenting problems: high Diagnostic procedures: high Management options: high   CRITICAL CARE RE: hypotension, syncope, profuse diarrhea, colitis, elevated lactate, aki Performed by: Mirna Mires Total critical care time: 135 minutes Critical care time was exclusive of separately billable procedures and treating other patients. Critical care was necessary to treat or prevent imminent or life-threatening deterioration. Critical care was time spent personally by me on the following activities: development of treatment plan with patient and/or surrogate as well as nursing, discussions with consultants, evaluation of patient's response to treatment, examination of patient, obtaining history from patient or surrogate, ordering and performing treatments and interventions, ordering and review of laboratory studies, ordering and review of radiographic studies, pulse oximetry and re-evaluation of patient's condition.   Final Clinical Impression(s) / ED Diagnoses Final diagnoses:  None    Rx / DC Orders ED Discharge Orders    None       Lajean Saver, MD 02/08/20 2150

## 2020-02-08 NOTE — H&P (Signed)
History and Physical   [name] VOH:[id number] DOB: 1941/04/09 DOA: 02/08/2020  Referring MD/NP/PA: Dr. Lajean Saver  PCP: [name], MD   Outpatient Specialists: Dr. Daneen Schick, cardiology  Patient coming from: Home  Chief Complaint: Dizziness most passing out  HPI: [name] is a 79 y.o. male with medical history significant of orthostatic hypotension, AAA, GERD, coronary artery disease, hyperlipidemia, hypertension, ischemic cardiomyopathy, diabetes who is here with his wife after sustaining initiation dizziness of almost passed out.  Patient and wife had lunch.  He took his regular meals including couple of glasses of milk.  They were headed to have him get his haircut when he suddenly felt dizzy lightheaded and almost passed out.  He was sitting in the parking lot in the car.  He did not hit his head.  He felt weak and dizzy before he actually lost consciousness.  When EMS arrived his systolic blood pressure was in his 64s.  He had no pulse.  He was however awake at the time.  Initiated on IV fluids and brought to the ER where he is seen and evaluated.  He has received couple boluses of IV fluids and systolic now is in the 694W.  Patient still weak and dizzy.  In between this episode patient had extensive diarrhea.  He is continue to have stools at this point.  CT abdomen showed evidence of some colitis.  Patient is being admitted with possibly acute colitis complicated with dehydration probably from third spacing and then syncope..  ED Course: Temperature 97.7 blood pressure initially 61/46 pulse 86 respirate 21 oxygen sats 87% on room air.  Chemistry showed glucose of 268 BUN 35 creatinine 1.77 albumin 2.8.  Lactic acid 2.9.  The rest of the chemistry and CBC are within normal.  Respiratory panel is negative for COVID-19.  Urinalysis essentially negative.  Chest x-ray showed features compatible with CHF with cardiomegaly.  CT abdomen pelvis showed focally thickened segment of the  distal descending colon and proximal sigmoid colon with surrounding pericolonic fat stranding.  Findings are favored acute infectious or vascular colitis.  Small volume free fluid in the pelvis dens consulting opacity in the left lung base with trace left pleural effusion surrounding atelectasis.  Findings are concerning for pneumonia.  Other significant findings that are chronic.  Patient being admitted to the hospital for treatment.  Review of Systems: As per HPI otherwise 10 point review of systems negative.    Past Medical History:  Diagnosis Date  . AAA (abdominal aortic aneurysm) ([place])    a. s/p repair 2009.  [name] Acid reflux    takes Prilosec and Protonix daily  . Arthritis    BacK  . CAD in native artery    a. a. prior inf MI 1993. b. PCI to LAD 05/1997. c. recurrent inferolateral MI complicated by V. fib arrest in 04/1998, prior PCI to OM1. d. 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  [name] Chronic combined systolic and diastolic CHF (congestive heart failure) ([place])   . Complication of anesthesia    -years ago hair fell out.;ileus after 2 of his surgeries  . Dry skin   . Foot drop, bilateral   . Hypercholesteremia   . Hypertension   . Ileus ([place])    After AAA  . Incisional hernia    "small, from AAA"  . Ischemic cardiomyopathy   . MI (myocardial infarction) ([place])   . 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 OSA on CPAP  uses CPAP--sleep study done at least 56yrs ago  . PAF (paroxysmal atrial fibrillation) ([place])   . Pain    back pain chronic- seen at pain clinic  . Plantar fasciitis    bilatetral  . Thrombocytopenia ([place])   . Type II diabetes mellitus ([place])     Past Surgical History:  Procedure Laterality Date  . ABDOMINAL AORTIC ANEURYSM REPAIR    . BACK SURGERY  2012-2013 X 3   Miminal Invasive x 3in [place].  [name] CARDIAC CATHETERIZATION  2009/2012  . CARDIOVERSION N/A 04/12/2015   Procedure: CARDIOVERSION;  Surgeon: Sueanne Margarita, MD;  Location: [facility];  Service:  Cardiovascular;  Laterality: N/A;  . CARDIOVERSION N/A 12/26/2016   Procedure: CARDIOVERSION;  Surgeon: Sueanne Margarita, MD;  Location: [facility];  Service: Cardiovascular;  Laterality: N/A;  . COLONOSCOPY    . CORONARY ANGIOPLASTY WITH STENT PLACEMENT  1992; 1997  . LAPAROSCOPIC CHOLECYSTECTOMY    . LUMBAR LAMINECTOMY/DECOMPRESSION MICRODISCECTOMY Right 01/16/2016   Procedure: Right Lumbar five-Sacral one Laminectomy;  Surgeon: Kristeen Miss, MD;  Location: [facility];  Service: Neurosurgery;  Laterality: Right;  Right L5-S1 Laminectomy     reports that he quit smoking about 28 years ago. His smoking use included cigarettes. He has a 33.00 pack-year smoking history. He has never used smokeless tobacco. He reports current alcohol use. He reports that he does not use drugs.  Allergies  Allergen Reactions  . Cymbalta [Duloxetine Hcl] Nausea And Vomiting and Other (See Comments)    PATIENT STATED THAT HE FELT LIKE HE "WAS GOING TO DIE" Rapid drop in blood pressure; sent him to the ER X2  . Neurontin [Gabapentin] Other (See Comments)    "Makes me goofy, keeps me off balance, clouds my thinking.."  . Keppra [Levetiracetam] Nausea And Vomiting    Family History  Problem Relation Age of Onset  . Diabetes Mother   . Coronary artery disease Mother   . Diabetes Father   . Arthritis Father   . Rheum arthritis Father   . Coronary artery disease Father      Prior to Admission medications   Medication Sig Start Date End Date Taking? Authorizing Provider  amiodarone (PACERONE) 200 MG tablet Take 1 tablet by mouth once daily Patient taking differently: Take 200 mg by mouth daily.  06/03/19  Yes Belva Crome, MD  amitriptyline (ELAVIL) 25 MG tablet Take 25 mg by mouth at bedtime.  12/11/18  Yes [provider]  atorvastatin (LIPITOR) 10 MG tablet Take 10 mg by mouth daily.   Yes [provider]  Coenzyme Q10 (CO Q 10) 100 MG CAPS Take 100 mg by mouth daily.   Yes [provider]  diazepam (VALIUM) 5 MG tablet Take 5 mg by mouth at bedtime as needed for anxiety.  10/31/15  Yes [provider]  empagliflozin (JARDIANCE) 10 MG TABS tablet Take 10 mg by mouth daily with breakfast.    Yes [provider]  furosemide (LASIX) 40 MG tablet Take 40 mg by mouth daily as needed for fluid.  01/11/20  Yes [provider]  glimepiride (AMARYL) 1 MG tablet Take 1 mg by mouth daily with breakfast.   Yes [provider]  metFORMIN (GLUCOPHAGE-XR) 500 MG 24 hr tablet Take 500-1,000 mg by mouth See admin instructions. 1,000 mg in the morning and 500 mg in the evening 10/12/15  Yes [provider]  metoprolol tartrate (LOPRESSOR) 50 MG tablet Take 1 tablet by mouth twice daily  Patient taking differently: Take 50 mg by mouth in the morning and at bedtime.  11/23/19  Yes Belva Crome, MD  Multiple Vitamins-Minerals (ONE-A-DAY MENS 50+ ADVANTAGE) TABS Take 1 tablet by mouth daily.   Yes [provider]  Olopatadine HCl (PATADAY OP) Place 1 drop into both eyes daily.   Yes [provider]  omeprazole (PRILOSEC) 20 MG capsule Take 20 mg by mouth daily. 09/14/12  Yes [provider]  oxyCODONE-acetaminophen (PERCOCET/ROXICET) 5-325 MG tablet Take 1 tablet by mouth every 6 (six) hours as needed (pain).    Yes [provider]  potassium chloride (KLOR-CON) 10 MEQ tablet Take 10 mEq by mouth daily. 01/11/20  Yes [provider]  pregabalin (LYRICA) 300 MG capsule Take 1 capsule by mouth 2 (two) times a day. 02/16/19  Yes [provider]  ramipril (ALTACE) 5 MG capsule Take 1 capsule (5 mg total) by mouth daily. 06/02/18  Yes Burtis Junes, NP  rivaroxaban (XARELTO) 20 MG TABS tablet Take 20 mg by mouth daily with supper.   Yes [provider]  trolamine salicylate (ASPERCREME) 10 % cream Apply 1 application topically as needed for muscle pain.   Yes [provider]  vitamin  B-12 (CYANOCOBALAMIN) 1000 MCG tablet Take 1,000 mcg by mouth daily.    Yes [provider]  docusate sodium (COLACE) 100 MG capsule Take 1 capsule (100 mg total) by mouth 2 (two) times daily. Patient not taking: Reported on 02/08/2020 07/09/19   Jani Gravel, MD  polyethylene glycol (MIRALAX / GLYCOLAX) 17 g packet Take 17 g by mouth daily as needed for moderate constipation. Patient not taking: Reported on 02/08/2020 07/09/19   Jani Gravel, MD    Physical Exam: Vitals:   02/08/20 2045 02/08/20 2100 02/08/20 2115 02/08/20 2130  BP: (!) 117/97 135/78 (!) 115/94 140/79  Pulse: 76 70 74 78  Resp: 16 14 17 13   Temp:      TempSrc:      SpO2: 100% 92% 98% 93%      Constitutional: Pleasant, acutely ill looking Vitals:   02/08/20 2045 02/08/20 2100 02/08/20 2115 02/08/20 2130  BP: (!) 117/97 135/78 (!) 115/94 140/79  Pulse: 76 70 74 78  Resp: 16 14 17 13   Temp:      TempSrc:      SpO2: 100% 92% 98% 93%   Eyes: PERRL, lids and conjunctivae normal ENMT: Mucous membranes are dry. Posterior pharynx clear of any exudate or lesions.Normal dentition.  Neck: normal, supple, no masses, no thyromegaly Respiratory: clear to auscultation bilaterally, no wheezing, no crackles. Normal respiratory effort. No accessory muscle use.  Cardiovascular: Regular rate and rhythm, systolic murmurs / rubs / gallops. No extremity edema. 2+ pedal pulses. No carotid bruits.  Abdomen: no tenderness, no masses palpated. No hepatosplenomegaly. Bowel sounds positive.  Musculoskeletal: no clubbing / cyanosis. No joint deformity upper and lower extremities. Good ROM, no contractures. Normal muscle tone.  Skin: no rashes, lesions, ulcers. No induration Neurologic: CN 2-12 grossly intact. Sensation intact, DTR normal. Strength 5/5 in all 4.  Psychiatric: Normal judgment and insight. Alert and oriented x 3.  Week and withdrawn.     Labs on Admission: I have personally reviewed following labs and imaging  studies  CBC: Recent Labs  Lab 02/08/20 1621  WBC 9.0  HGB 14.4  HCT 42.6  MCV 94.0  PLT 938   Basic Metabolic Panel: Recent Labs  Lab 02/08/20 1621  NA 140  K 4.5  CL 103  CO2 24  GLUCOSE 268*  BUN 34*  CREATININE 1.77*  CALCIUM 8.9   GFR: CrCl cannot be calculated (Unknown ideal weight.). Liver Function Tests: Recent Labs  Lab 02/08/20 1621  AST 23  ALT 22  ALKPHOS 72  BILITOT 0.9  PROT 6.6  ALBUMIN 2.8*   No results for input(s): LIPASE, AMYLASE in the last 168 hours. No results for input(s): AMMONIA in the last 168 hours. Coagulation Profile: No results for input(s): INR, PROTIME in the last 168 hours. Cardiac Enzymes: No results for input(s): CKTOTAL, CKMB, CKMBINDEX, TROPONINI in the last 168 hours. BNP (last 3 results) No results for input(s): PROBNP in the last 8760 hours. HbA1C: No results for input(s): HGBA1C in the last 72 hours. CBG: Recent Labs  Lab 02/08/20 1632  GLUCAP 232*   Lipid Profile: No results for input(s): CHOL, HDL, LDLCALC, TRIG, CHOLHDL, LDLDIRECT in the last 72 hours. Thyroid Function Tests: No results for input(s): TSH, T4TOTAL, FREET4, T3FREE, THYROIDAB in the last 72 hours. Anemia Panel: No results for input(s): VITAMINB12, FOLATE, FERRITIN, TIBC, IRON, RETICCTPCT in the last 72 hours. Urine analysis:    Component Value Date/Time   COLORURINE YELLOW 02/08/2020 1624   APPEARANCEUR CLEAR 02/08/2020 1624   LABSPEC 1.011 02/08/2020 1624   PHURINE 6.0 02/08/2020 1624   GLUCOSEU >=500 (A) 02/08/2020 1624   HGBUR LARGE (A) 02/08/2020 1624   BILIRUBINUR NEGATIVE 02/08/2020 1624   KETONESUR NEGATIVE 02/08/2020 1624   PROTEINUR NEGATIVE 02/08/2020 1624   UROBILINOGEN 0.2 12/12/2014 0150   NITRITE NEGATIVE 02/08/2020 1624   LEUKOCYTESUR NEGATIVE 02/08/2020 1624   Sepsis Labs: @LABRCNTIP (procalcitonin:4,lacticidven:4) ) Recent Results (from the past 240 hour(s))  Respiratory Panel by RT PCR (Flu A&B, Covid) -  Nasopharyngeal Swab     Status: None   Collection Time: 02/08/20  4:52 PM   Specimen: Nasopharyngeal Swab  Result Value Ref Range Status   SARS Coronavirus 2 by RT PCR NEGATIVE NEGATIVE Final    Comment: (NOTE) SARS-CoV-2 target nucleic acids are NOT DETECTED. The SARS-CoV-2 RNA is generally detectable in upper respiratoy specimens during the acute phase of infection. The lowest concentration of SARS-CoV-2 viral copies this assay can detect is 131 copies/mL. A negative result does not preclude SARS-Cov-2 infection and should not be used as the sole basis for treatment or other patient management decisions. A negative result may occur with  improper specimen collection/handling, submission of specimen other than nasopharyngeal swab, presence of viral mutation(s) within the areas targeted by this assay, and inadequate number of viral copies (<131 copies/mL). A negative result must be combined with clinical observations, patient history, and epidemiological information. The expected result is Negative. Fact Sheet for Patients:  PinkCheek.be Fact Sheet for Healthcare Providers:  GravelBags.it This test is not yet ap proved or cleared by the [place] FDA and  has been authorized for detection and/or diagnosis of SARS-CoV-2 by FDA under an Emergency Use Authorization (EUA). This EUA will remain  in effect (meaning this test can be used) for the duration of the COVID-19 declaration under Section 564(b)(1) of the Act, 21 U.S.C. section 360bbb-3(b)(1), unless the authorization is terminated or revoked sooner.    Influenza A by PCR NEGATIVE NEGATIVE Final   Influenza B by PCR NEGATIVE NEGATIVE Final    Comment: (NOTE) The Xpert Xpress SARS-CoV-2/FLU/RSV assay is intended as an aid in  the diagnosis of influenza from Nasopharyngeal swab specimens and  should not be used as a sole basis for treatment. Nasal washings  and  aspirates  are unacceptable for Xpert Xpress SARS-CoV-2/FLU/RSV  testing. Fact Sheet for Patients: PinkCheek.be Fact Sheet for Healthcare Providers: GravelBags.it This test is not yet approved or cleared by the [place] FDA and  has been authorized for detection and/or diagnosis of SARS-CoV-2 by  FDA under an Emergency Use Authorization (EUA). This EUA will remain  in effect (meaning this test can be used) for the duration of the  Covid-19 declaration under Section 564(b)(1) of the Act, 21  U.S.C. section 360bbb-3(b)(1), unless the authorization is  terminated or revoked. Performed at [facility], [place] [address], [place], [place] [address]      Radiological Exams on Admission: DG Chest Port 1 View  Result Date: 02/08/2020 CLINICAL DATA:  Weakness, hypotension EXAM: PORTABLE CHEST 1 VIEW COMPARISON:  Radiograph 09/30/2012 FINDINGS: Increasing hazy interstitial opacities within the lungs cardiomegaly and indistinct pulmonary vascularity may reflect mild interstitial edema. No consolidative opacity, pneumothorax or effusion. The aorta is calcified. The remaining cardiomediastinal contours are unremarkable. No acute osseous or soft tissue abnormality. Degenerative changes are present in the imaged spine and shoulders. Telemetry leads overlie the chest. IMPRESSION: Features compatible with CHF/volume overload with cardiomegaly and interstitial opacities favoring edema. Electronically Signed   By: Lovena Le M.D.   On: 02/08/2020 17:23   CT Renal Stone Study  Result Date: 02/08/2020 CLINICAL DATA:  Syncopal event EXAM: CT ABDOMEN AND PELVIS WITHOUT CONTRAST TECHNIQUE: Multidetector CT imaging of the abdomen and pelvis was performed following the standard protocol without IV contrast. COMPARISON:  Radiograph 07/08/2019, CT 07/04/2019 FINDINGS: Lower chest: There is a dense consolidative opacity in the left lung base with trace left pleural  effusion and some surrounding atelectasis. The right lung base is predominantly clear aside from some basilar atelectatic changes as well. Normal heart size. No pericardial effusion. Extensive coronary artery calcifications. Hepatobiliary: No focal liver abnormality is seen. Patient is post cholecystectomy. Slight prominence of the biliary tree likely related to reservoir effect. No calcified intraductal gallstones. Pancreas: Unremarkable. No pancreatic ductal dilatation or surrounding inflammatory changes. Spleen: Normal in size without focal abnormality. Adrenals/Urinary Tract: Normal adrenal glands. Mild bilateral symmetric perinephric stranding, a nonspecific finding though may correlate with either age or decreased renal function. Asymmetric left renal atrophy with extensive areas of cortical scarring. Several fluid attenuation cysts are noted in the right kidney as well as a more intermediate partially exophytic 2.2 cm lesion arising from the posterior interpolar right kidney (3/45). Nonobstructing calculus present in the collecting system of the left lower pole. Additional vascular calcifications noted in both renal pelves. No hydronephrosis or obstructive urolithiasis. Mild bladder distension without other gross abnormality. Stomach/Bowel: Distal esophagus, stomach and duodenal sweep are unremarkable. No small bowel wall thickening or dilatation. No evidence of obstruction. A normal appendix is visualized. Proximal colon is unremarkable. There is a focally thickened segment of the distal descending colon and proximal sigmoid with surrounding pericolonic fat stranding which does not appear centered upon a focal diverticulum. Vascular/Lymphatic: Extensive atherosclerotic calcification of the abdominal aorta and branch vessels. Slight fusiform ectasia of the infrarenal abdominal aorta is likely related to prior abdominal aortic aneurysm repair incompletely assessed on this exam examination in the absence of  intravenous contrast media. Some reactive adenopathy noted in the low pelvis and mesentery. Reproductive: Prostate at the upper limits of normal for size. No acute CT abnormality. Other: No bowel containing hernias. No abdominopelvic free air. Small volume free fluid in the pelvis, favor reactive given simple attenuation (-  1 HU). Musculoskeletal: Prior L3-L5 posterior spinal decompression and fusion with interbody spacer placement. No evidence of acute hardware complication or failure. IMPRESSION: 1. Focally thickened segment of the distal descending colon and proximal sigmoid with surrounding pericolonic fat stranding which does not appear centered upon a focal diverticulum. Findings are favored to represent an acute infectious, inflammatory or vascular colitis. 2. Small volume free fluid in the pelvis, favor reactive given simple attenuation (-1 HU). 3. Dense consolidative opacity in the left lung base with trace left pleural effusion and some surrounding atelectasis. Findings are concerning for pneumonia. Recommend follow-up chest CT in 6-8 weeks following appropriate therapy to ensure resolution. 4. Asymmetric left renal atrophy with extensive areas of cortical scarring. Nonobstructing left nephrolithiasis. 5. Extensive coronary artery calcifications. 6. Postsurgical changes of prior abdominal aortic aneurysm repair incompletely assessed on this exam in the absence of intravenous contrast media. 7. Prior L3-L5 posterior spinal decompression and fusion with interbody spacer placement. No evidence of acute hardware complication or failure. 8. Aortic Atherosclerosis (ICD10-I70.0). Electronically Signed   By: Lovena Le M.D.   On: 02/08/2020 20:27    EKG: Independently reviewed.  Shows sinus rhythm with prolonged PR interval.  Nonspecific interventricular conduction delay.  Assessment/Plan Principal Problem:   Acute colitis Active Problems:   Hypertension   Sleep apnea   DM (diabetes mellitus), type 2,  uncontrolled w/neurologic complication (HCC)   Paroxysmal atrial fibrillation with RVR (HCC)   Coronary artery disease involving native coronary artery of native heart with angina pectoris (HCC)   Chronic combined systolic and diastolic heart failure ([place])   Long term current use of anticoagulant therapy   Hyperlipidemia   AKI (acute kidney injury) ([place])   Near syncope   Hypotension     #1 acute colitis: Most likely is infectious versus vascular.  Patient will be admitted and initiated on IV antibiotics.  He will be on Flagyl and cefepime.  Monitor his response closely.  Diarrhea is ongoing.  Evaluate stools for infectious agent.  #2  Hypertension: Continue blood pressure control.  #3 diabetes: Sliding scale insulin with home regimen  #4 CHF: We will be careful with fluid resuscitation.  Focus IV fluids to no more than a liter more.  #5 paroxysmal atrial fibrillation: Rate is controlled.  In sinus rhythm now.  Continue home regimen  #6 hyperlipidemia: Continue home regimen statin.  #7 coronary artery disease with ischemic cardiomyopathy: Compensated.  Continue home regimen  #8 syncopal episode: Suspected dehydration with orthostasis.  Patient has chronic orthostatic hypotension on and off.  In this case his colitis may have led to pooling of fluids in the colon rendering him intravascularly dry.  And then later had explosive diarrhea consistent with his acute colitis.  Hydrate the patient and monitor.   DVT prophylaxis: Xarelto Code Status: Full code Family Communication: Wife at bedside Disposition Plan: To be determined Consults called: None Admission status: Inpatient  Severity of Illness: The appropriate patient status for this patient is INPATIENT. Inpatient status is judged to be reasonable and necessary in order to provide the required intensity of service to ensure the patient's safety. The patient's presenting symptoms, physical exam findings, and initial radiographic  and laboratory data in the context of their chronic comorbidities is felt to place them at high risk for further clinical deterioration. Furthermore, it is not anticipated that the patient will be medically stable for discharge from the hospital within 2 midnights of admission. The following factors support the patient status of inpatient.   "  The patient's presenting symptoms include abdominal pain and syncope. " The worrisome physical exam findings include dry mucous membranes. " The initial radiographic and laboratory data are worrisome because of CT findings of colitis. " The chronic co-morbidities include diabetes with hypertension.   * I certify that at the point of admission it is my clinical judgment that the patient will require inpatient hospital care spanning beyond 2 midnights from the point of admission due to high intensity of service, high risk for further deterioration and high frequency of surveillance required.Barbette Merino MD [facility] Pager [phone]  If 7PM-7AM, please contact night-coverage [email or web address] Password [facility]  02/08/2020, 10:08 PM

## 2020-02-08 NOTE — ED Triage Notes (Signed)
Pt here from home via [facility] for syncope, hypotension. Pt was going to get his haircut and had syncopal episode while sitting in the car in the parking lot, did not hit head. Per pt he felt weak and dizzy before LOC. Per EMS pt did not have radial pulses and had palpated 60BP, pt has had diarrhea in route to hospital. Upon arrival pt BP 60s/30s, NSR w/ PVCs. AOx4.

## 2020-02-08 NOTE — Progress Notes (Signed)
Pharmacy Antibiotic Note  KODEE RAVERT is a 79 y.o. male admitted on 02/08/2020 with Intra-abdominal infection.  Pharmacy has been consulted for cefepime dosing.  Flagyl per MD.    Plan: Cefepime 2g IV every 24 hours Monitor renal function, clinical progression and LOT F/u GI workup and ability to narrow     Temp (24hrs), Avg:97.7 F (36.5 C), Min:97.7 F (36.5 C), Max:97.7 F (36.5 C)  Recent Labs  Lab 02/08/20 1621 02/08/20 1930  WBC 9.0  --   CREATININE 1.77*  --   LATICACIDVEN 2.9* 2.4*    CrCl cannot be calculated (Unknown ideal weight.).    Allergies  Allergen Reactions  . Cymbalta [Duloxetine Hcl] Nausea And Vomiting and Other (See Comments)    PATIENT STATED THAT HE FELT LIKE HE "WAS GOING TO DIE" Rapid drop in blood pressure; sent him to the ER X2  . Neurontin [Gabapentin] Other (See Comments)    "Makes me goofy, keeps me off balance, clouds my thinking.."  . Keppra [Levetiracetam] Nausea And Vomiting    Bertis Ruddy, PharmD Clinical Pharmacist ED Pharmacist Phone # [phone] 02/08/2020 10:00 PM

## 2020-02-09 LAB — CBC
HCT: 42.7 % (ref 39.0–52.0)
Hemoglobin: 14 g/dL (ref 13.0–17.0)
MCH: 31.3 pg (ref 26.0–34.0)
MCHC: 32.8 g/dL (ref 30.0–36.0)
MCV: 95.3 fL (ref 80.0–100.0)
Platelets: 180 10*3/uL (ref 150–400)
RBC: 4.48 MIL/uL (ref 4.22–5.81)
RDW: 14.1 % (ref 11.5–15.5)
WBC: 17.5 10*3/uL — ABNORMAL HIGH (ref 4.0–10.5)
nRBC: 0 % (ref 0.0–0.2)

## 2020-02-09 LAB — COMPREHENSIVE METABOLIC PANEL
ALT: 21 U/L (ref 0–44)
AST: 19 U/L (ref 15–41)
Albumin: 2.8 g/dL — ABNORMAL LOW (ref 3.5–5.0)
Alkaline Phosphatase: 59 U/L (ref 38–126)
Anion gap: 13 (ref 5–15)
BUN: 27 mg/dL — ABNORMAL HIGH (ref 8–23)
CO2: 23 mmol/L (ref 22–32)
Calcium: 8.9 mg/dL (ref 8.9–10.3)
Chloride: 103 mmol/L (ref 98–111)
Creatinine, Ser: 1.27 mg/dL — ABNORMAL HIGH (ref 0.61–1.24)
GFR calc Af Amer: >60 mL/min (ref >60)
GFR calc non Af Amer: 53 mL/min — ABNORMAL LOW (ref >60)
Glucose, Bld: 144 mg/dL — ABNORMAL HIGH (ref 70–99)
Potassium: 4.4 mmol/L (ref 3.5–5.1)
Sodium: 139 mmol/L (ref 135–145)
Total Bilirubin: 0.6 mg/dL (ref 0.3–1.2)
Total Protein: 6.6 g/dL (ref 6.5–8.1)

## 2020-02-09 LAB — CBG MONITORING, ED: Glucose-Capillary: 152 mg/dL — ABNORMAL HIGH (ref 70–99)

## 2020-02-09 LAB — TSH: TSH: 0.319 u[IU]/mL — ABNORMAL LOW (ref 0.350–4.500)

## 2020-02-09 LAB — GLUCOSE, CAPILLARY
Glucose-Capillary: 154 mg/dL — ABNORMAL HIGH (ref 70–99)
Glucose-Capillary: 127 mg/dL — ABNORMAL HIGH (ref 70–99)
Glucose-Capillary: 122 mg/dL — ABNORMAL HIGH (ref 70–99)
Glucose-Capillary: 135 mg/dL — ABNORMAL HIGH (ref 70–99)

## 2020-02-09 LAB — C DIFFICILE QUICK SCREEN W PCR REFLEX
C Diff antigen: NEGATIVE
C Diff interpretation: NOT DETECTED
C Diff toxin: NEGATIVE

## 2020-02-09 MED ORDER — METFORMIN HCL 500 MG PO TABS: 500.0000 mg | ORAL_TABLET | Freq: Every day | ORAL | Status: DC

## 2020-02-09 MED ORDER — INSULIN ASPART 100 UNIT/ML ~~LOC~~ SOLN
0.0000 [IU] | Freq: Every day | SUBCUTANEOUS | Status: DC
  Administered 2020-02-16: 2 [IU] via SUBCUTANEOUS

## 2020-02-09 MED ORDER — SODIUM CHLORIDE 0.9 % IV SOLN
2.0000 g | Freq: Two times a day (BID) | INTRAVENOUS | Status: AC
  Administered 2020-02-09 – 2020-02-13 (×10): 2 g via INTRAVENOUS
  Filled 2020-02-09 (×10): qty 2

## 2020-02-09 MED ORDER — ONDANSETRON HCL 4 MG PO TABS: 4.0000 mg | ORAL_TABLET | Freq: Four times a day (QID) | ORAL | Status: DC | PRN

## 2020-02-09 MED ORDER — METFORMIN HCL 500 MG PO TABS: 1000.0000 mg | ORAL_TABLET | Freq: Every day | ORAL | Status: DC

## 2020-02-09 MED ORDER — SODIUM CHLORIDE 0.9 % IV SOLN: INTRAVENOUS | Status: DC

## 2020-02-09 MED ORDER — METFORMIN HCL ER 500 MG PO TB24: 500.0000 mg | ORAL_TABLET | Freq: Every day | ORAL | Status: DC

## 2020-02-09 MED ORDER — METRONIDAZOLE IN NACL 5-0.79 MG/ML-% IV SOLN
500.0000 mg | Freq: Three times a day (TID) | INTRAVENOUS | Status: DC
  Administered 2020-02-09 – 2020-02-13 (×15): 500 mg via INTRAVENOUS
  Filled 2020-02-09 (×15): qty 100

## 2020-02-09 MED ORDER — METFORMIN HCL ER 500 MG PO TB24
1000.0000 mg | ORAL_TABLET | Freq: Every day | ORAL | Status: DC
  Filled 2020-02-09: qty 2

## 2020-02-09 MED ORDER — PROMETHAZINE HCL 25 MG/ML IJ SOLN
12.5000 mg | Freq: Four times a day (QID) | INTRAMUSCULAR | Status: DC | PRN
  Administered 2020-02-09 (×2): 12.5 mg via INTRAVENOUS
  Filled 2020-02-09 (×2): qty 1

## 2020-02-09 MED ORDER — ONDANSETRON HCL 4 MG/2ML IJ SOLN
4.0000 mg | Freq: Four times a day (QID) | INTRAMUSCULAR | Status: DC | PRN
  Administered 2020-02-09: 4 mg via INTRAVENOUS
  Filled 2020-02-09: qty 2

## 2020-02-09 MED ORDER — LIDOCAINE 5 % EX OINT
TOPICAL_OINTMENT | Freq: Three times a day (TID) | CUTANEOUS | Status: DC | PRN
  Filled 2020-02-09: qty 35.44

## 2020-02-09 MED ORDER — MORPHINE SULFATE (PF) 2 MG/ML IV SOLN
2.0000 mg | Freq: Once | INTRAVENOUS | Status: AC
  Administered 2020-02-09: 2 mg via INTRAVENOUS
  Filled 2020-02-09: qty 1

## 2020-02-09 MED ORDER — INSULIN ASPART 100 UNIT/ML ~~LOC~~ SOLN
0.0000 [IU] | Freq: Three times a day (TID) | SUBCUTANEOUS | Status: DC
  Administered 2020-02-09: 3 [IU] via SUBCUTANEOUS
  Administered 2020-02-10 – 2020-02-11 (×3): 2 [IU] via SUBCUTANEOUS
  Administered 2020-02-14: 10:00:00 3 [IU] via SUBCUTANEOUS
  Administered 2020-02-14 (×2): 5 [IU] via SUBCUTANEOUS
  Administered 2020-02-15: 8 [IU] via SUBCUTANEOUS
  Administered 2020-02-15: 5 [IU] via SUBCUTANEOUS
  Administered 2020-02-15: 8 [IU] via SUBCUTANEOUS
  Administered 2020-02-16: 3 [IU] via SUBCUTANEOUS
  Administered 2020-02-16: 5 [IU] via SUBCUTANEOUS
  Administered 2020-02-16 – 2020-02-17 (×2): 8 [IU] via SUBCUTANEOUS
  Administered 2020-02-17: 11 [IU] via SUBCUTANEOUS
  Administered 2020-02-17: 5 [IU] via SUBCUTANEOUS

## 2020-02-09 NOTE — Progress Notes (Signed)
PROGRESS NOTE    [name]  DVV:[id number] DOB: [birth year] DOA: 02/08/2020 PCP: [name], MD    Brief Narrative:  79 year old gentleman with history of orthostatic hypotension, AAA repair, GERD, coronary artery disease, hyperlipidemia, hypertension, ischemic cardiomyopathy and diabetes, paroxysmal A. fib on Xarelto presented to the emergency room with dizziness and near passing out spells.  Patient felt weak and dizzy.  When EMS arrived his systolic blood pressures were 60s.  Patient also started having explosive diarrhea. In the emergency room, afebrile.  Blood pressures initially 6146 with heart rate of 86 that responded well to fluid resuscitation.  Oxygen 87% on room air.  Lactic acid 2.9.  Rest of the chemistry and CBC were unremarkable.  Chest x-ray showed CHF with cardiomegaly mostly chronic.  CT scan of the abdomen pelvis showed focally thickened segment of the distal descending colon and proximal sigmoid colon with surrounding pericolonic fatty stranding.  Admitted with fluid and antibiotics.   Assessment & Plan:   Principal Problem:   Acute colitis Active Problems:   Hypertension   Sleep apnea   DM (diabetes mellitus), type 2, uncontrolled w/neurologic complication (HCC)   Paroxysmal atrial fibrillation with RVR (HCC)   Coronary artery disease involving native coronary artery of native heart with angina pectoris (HCC)   Chronic combined systolic and diastolic heart failure ([place])   Long term current use of anticoagulant therapy   Hyperlipidemia   AKI (acute kidney injury) ([place])   Near syncope   Hypotension  Sepsis present on admission probably due to acute colitis: Infective versus ischemic colitis.  Patient continues to have loose stool along with some fresh bleeding. Blood pressures responded adequate, continue IV fluids. CT scan with no evidence of obstruction, will allow clear liquid diet as tolerated. C. difficile negative.  GI pathogen panel pending. We will  continue cefepime and Flagyl until clinical improvement. Lactic acid normalized. Last colonoscopy 5 years ago with no obvious findings.  Abnormal chest x-ray/abnormal CT scan of the chest: Unknown whether acute or chronic findings.  Patient does not have any pulmonary symptoms.  We will follow up.  Syncope/vasovagal: Suspect due to dehydration with orthostasis.  Patient does have history of chronic orthostatic changes.  Discontinue ACE inhibitor's.  Continue IV and oral hydration.  Will monitor.  Check orthostatic vitals.  Hypertension: Hypotensive on arrival.  Discontinue antihypertensives.  Paroxysmal A. fib: Rate controlled.  In sinus rhythm now.  Will hold Xarelto given blood mixed stool.  Coronary artery disease with history of ischemic cardiomyopathy: Currently stable.  Acute kidney injury: Continues to respond.  Creatinine trending down.  Continue monitoring.  Recheck levels in the morning.   DVT prophylaxis: Xarelto Code Status: Full code Family Communication: Wife at the bedside Disposition Plan: patient is from home. Anticipated DC to home, Barriers to discharge active diarrhea, ischemic colitis, on antibiotics, final results pending and improvement pending.   Consultants:   None  Procedures:   None  Antimicrobials:  Antibiotics Given (last 72 hours)    Date/Time Action Medication Dose Rate   02/08/20 2314 New Bag/Given   ceFEPIme (MAXIPIME) 2 g in sodium chloride 0.9 % 100 mL IVPB 2 g 200 mL/hr   02/08/20 2316 New Bag/Given   metroNIDAZOLE (FLAGYL) IVPB 500 mg 500 mg 100 mL/hr   02/09/20 1028 New Bag/Given   metroNIDAZOLE (FLAGYL) IVPB 500 mg 500 mg 100 mL/hr   02/09/20 1323 New Bag/Given   ceFEPIme (MAXIPIME) 2 g in sodium chloride 0.9 % 100 mL IVPB 2 g  200 mL/hr   02/09/20 1532 New Bag/Given   metroNIDAZOLE (FLAGYL) IVPB 500 mg 500 mg 100 mL/hr         Subjective: Patient was seen and examined.  Reexamined with wife present at the bedside.  Complains of  not feeling well.  Still has some nausea.  Had a one episode of vomiting earlier.  Afebrile. Does have some distention of the abdomen but no pain. He does have fecal management system that is draining large amount of liquid stool with some maroon blood. Both feet hurt due to peripheral neuropathy.  Objective: Vitals:   02/09/20 0215 02/09/20 0230 02/09/20 0258 02/09/20 1200  BP: 123/68 117/71 119/66 110/61  Pulse: 84 86 88 96  Resp: 14 15 16 14   Temp:   99.4 F (37.4 C) 98.4 F (36.9 C)  TempSrc:   Oral Oral  SpO2: 96% 95% 97% 96%    Intake/Output Summary (Last 24 hours) at 02/09/2020 1701 Last data filed at 02/09/2020 1300 Gross per 24 hour  Intake 2663.33 ml  Output 2001 ml  Net 662.33 ml   There were no vitals filed for this visit.  Examination:  General exam: Appears sick looking, comfortably talking.  Tired and lethargic. Respiratory system: Clear to auscultation. Respiratory effort normal.  No added sounds. Cardiovascular system: S1 & S2 heard, RRR. No JVD, murmurs, rubs, gallops or clicks.  Shiny lower extremities. Gastrointestinal system: Abdomen is distended, nontender.  No localized rigidity or guarding.  Bowel sounds present. Central nervous system: Alert and oriented. No focal neurological deficits. Extremities: Symmetric 5 x 5 power. Skin: No rashes, lesions or ulcers Psychiatry: Judgement and insight appear normal. Mood & affect appropriate.     Data Reviewed: I have personally reviewed following labs and imaging studies  CBC: Recent Labs  Lab 02/08/20 1621 02/09/20 0414  WBC 9.0 17.5*  HGB 14.4 14.0  HCT 42.6 42.7  MCV 94.0 95.3  PLT 249 202   Basic Metabolic Panel: Recent Labs  Lab 02/08/20 1621 02/09/20 0414  NA 140 139  K 4.5 4.4  CL 103 103  CO2 24 23  GLUCOSE 268* 144*  BUN 34* 27*  CREATININE 1.77* 1.27*  CALCIUM 8.9 8.9   GFR: CrCl cannot be calculated (Unknown ideal weight.). Liver Function Tests: Recent Labs  Lab  02/08/20 1621 02/09/20 0414  AST 23 19  ALT 22 21  ALKPHOS 72 59  BILITOT 0.9 0.6  PROT 6.6 6.6  ALBUMIN 2.8* 2.8*   No results for input(s): LIPASE, AMYLASE in the last 168 hours. No results for input(s): AMMONIA in the last 168 hours. Coagulation Profile: No results for input(s): INR, PROTIME in the last 168 hours. Cardiac Enzymes: No results for input(s): CKTOTAL, CKMB, CKMBINDEX, TROPONINI in the last 168 hours. BNP (last 3 results) No results for input(s): PROBNP in the last 8760 hours. HbA1C: No results for input(s): HGBA1C in the last 72 hours. CBG: Recent Labs  Lab 02/08/20 1632 02/09/20 0044 02/09/20 0758 02/09/20 1300  GLUCAP 232* 152* 154* 127*   Lipid Profile: No results for input(s): CHOL, HDL, LDLCALC, TRIG, CHOLHDL, LDLDIRECT in the last 72 hours. Thyroid Function Tests: Recent Labs    02/09/20 0414  TSH 0.319*   Anemia Panel: No results for input(s): VITAMINB12, FOLATE, FERRITIN, TIBC, IRON, RETICCTPCT in the last 72 hours. Sepsis Labs: Recent Labs  Lab 02/08/20 1621 02/08/20 1930 02/08/20 2300  LATICACIDVEN 2.9* 2.4* 1.5    Recent Results (from the past 240 hour(s))  Respiratory Panel  by RT PCR (Flu A&B, Covid) - Nasopharyngeal Swab     Status: None   Collection Time: 02/08/20  4:52 PM   Specimen: Nasopharyngeal Swab  Result Value Ref Range Status   SARS Coronavirus 2 by RT PCR NEGATIVE NEGATIVE Final    Comment: (NOTE) SARS-CoV-2 target nucleic acids are NOT DETECTED. The SARS-CoV-2 RNA is generally detectable in upper respiratoy specimens during the acute phase of infection. The lowest concentration of SARS-CoV-2 viral copies this assay can detect is 131 copies/mL. A negative result does not preclude SARS-Cov-2 infection and should not be used as the sole basis for treatment or other patient management decisions. A negative result may occur with  improper specimen collection/handling, submission of specimen other than nasopharyngeal  swab, presence of viral mutation(s) within the areas targeted by this assay, and inadequate number of viral copies (<131 copies/mL). A negative result must be combined with clinical observations, patient history, and epidemiological information. The expected result is Negative. Fact Sheet for Patients:  PinkCheek.be Fact Sheet for Healthcare Providers:  GravelBags.it This test is not yet ap proved or cleared by the [place] FDA and  has been authorized for detection and/or diagnosis of SARS-CoV-2 by FDA under an Emergency Use Authorization (EUA). This EUA will remain  in effect (meaning this test can be used) for the duration of the COVID-19 declaration under Section 564(b)(1) of the Act, 21 U.S.C. section 360bbb-3(b)(1), unless the authorization is terminated or revoked sooner.    Influenza A by PCR NEGATIVE NEGATIVE Final   Influenza B by PCR NEGATIVE NEGATIVE Final    Comment: (NOTE) The Xpert Xpress SARS-CoV-2/FLU/RSV assay is intended as an aid in  the diagnosis of influenza from Nasopharyngeal swab specimens and  should not be used as a sole basis for treatment. Nasal washings and  aspirates are unacceptable for Xpert Xpress SARS-CoV-2/FLU/RSV  testing. Fact Sheet for Patients: PinkCheek.be Fact Sheet for Healthcare Providers: GravelBags.it This test is not yet approved or cleared by the [place] FDA and  has been authorized for detection and/or diagnosis of SARS-CoV-2 by  FDA under an Emergency Use Authorization (EUA). This EUA will remain  in effect (meaning this test can be used) for the duration of the  Covid-19 declaration under Section 564(b)(1) of the Act, 21  U.S.C. section 360bbb-3(b)(1), unless the authorization is  terminated or revoked. Performed at [facility], [place] [address] [place], [place] [address]   Blood Culture (routine x  2)     Status: None (Preliminary result)   Collection Time: 02/08/20 10:48 PM   Specimen: BLOOD  Result Value Ref Range Status   Specimen Description BLOOD RIGHT ARM  Final   Special Requests   Final    BOTTLES DRAWN AEROBIC AND ANAEROBIC Blood Culture adequate volume Performed at [facility], [place] [address] [place], [place] [address]    Culture NO GROWTH < 12 HOURS  Final   Report Status PENDING  Incomplete  Blood Culture (routine x 2)     Status: None (Preliminary result)   Collection Time: 02/08/20 11:08 PM   Specimen: BLOOD  Result Value Ref Range Status   Specimen Description BLOOD RIGHT FOREARM  Final   Special Requests   Final    BOTTLES DRAWN AEROBIC AND ANAEROBIC Blood Culture adequate volume Performed at [facility], [place] [address] [place], [place] [address]    Culture NO GROWTH < 12 HOURS  Final   Report Status PENDING  Incomplete  C Difficile Quick Screen w PCR reflex     Status: None   Collection Time: 02/09/20  4:27 AM   Specimen: Stool  Result Value Ref Range Status   C Diff antigen NEGATIVE NEGATIVE Final   C Diff toxin NEGATIVE NEGATIVE Final   C Diff interpretation No C. difficile detected.  Final    Comment: Performed at [facility], [place] [address], [place], [place] [address]         Radiology Studies: DG Chest Port 1 View  Result Date: 02/08/2020 CLINICAL DATA:  Weakness, hypotension EXAM: PORTABLE CHEST 1 VIEW COMPARISON:  Radiograph 09/30/2012 FINDINGS: Increasing hazy interstitial opacities within the lungs cardiomegaly and indistinct pulmonary vascularity may reflect mild interstitial edema. No consolidative opacity, pneumothorax or effusion. The aorta is calcified. The remaining cardiomediastinal contours are unremarkable. No acute osseous or soft tissue abnormality. Degenerative changes are present in the imaged spine and shoulders. Telemetry leads overlie the chest. IMPRESSION: Features compatible with CHF/volume overload with  cardiomegaly and interstitial opacities favoring edema. Electronically Signed   By: Lovena Le M.D.   On: 02/08/2020 17:23   CT Renal Stone Study  Result Date: 02/08/2020 CLINICAL DATA:  Syncopal event EXAM: CT ABDOMEN AND PELVIS WITHOUT CONTRAST TECHNIQUE: Multidetector CT imaging of the abdomen and pelvis was performed following the standard protocol without IV contrast. COMPARISON:  Radiograph 07/08/2019, CT 07/04/2019 FINDINGS: Lower chest: There is a dense consolidative opacity in the left lung base with trace left pleural effusion and some surrounding atelectasis. The right lung base is predominantly clear aside from some basilar atelectatic changes as well. Normal heart size. No pericardial effusion. Extensive coronary artery calcifications. Hepatobiliary: No focal liver abnormality is seen. Patient is post cholecystectomy. Slight prominence of the biliary tree likely related to reservoir effect. No calcified intraductal gallstones. Pancreas: Unremarkable. No pancreatic ductal dilatation or surrounding inflammatory changes. Spleen: Normal in size without focal abnormality. Adrenals/Urinary Tract: Normal adrenal glands. Mild bilateral symmetric perinephric stranding, a nonspecific finding though may correlate with either age or decreased renal function. Asymmetric left renal atrophy with extensive areas of cortical scarring. Several fluid attenuation cysts are noted in the right kidney as well as a more intermediate partially exophytic 2.2 cm lesion arising from the posterior interpolar right kidney (3/45). Nonobstructing calculus present in the collecting system of the left lower pole. Additional vascular calcifications noted in both renal pelves. No hydronephrosis or obstructive urolithiasis. Mild bladder distension without other gross abnormality. Stomach/Bowel: Distal esophagus, stomach and duodenal sweep are unremarkable. No small bowel wall thickening or dilatation. No evidence of obstruction. A  normal appendix is visualized. Proximal colon is unremarkable. There is a focally thickened segment of the distal descending colon and proximal sigmoid with surrounding pericolonic fat stranding which does not appear centered upon a focal diverticulum. Vascular/Lymphatic: Extensive atherosclerotic calcification of the abdominal aorta and branch vessels. Slight fusiform ectasia of the infrarenal abdominal aorta is likely related to prior abdominal aortic aneurysm repair incompletely assessed on this exam examination in the absence of intravenous contrast media. Some reactive adenopathy noted in the low pelvis and mesentery. Reproductive: Prostate at the upper limits of normal for size. No acute CT abnormality. Other: No bowel containing hernias. No abdominopelvic free air. Small volume free fluid in the pelvis, favor reactive given simple attenuation (-1 HU). Musculoskeletal: Prior L3-L5 posterior spinal decompression and fusion with interbody spacer placement. No evidence of acute hardware complication or failure. IMPRESSION: 1. Focally thickened segment of the distal descending colon and  proximal sigmoid with surrounding pericolonic fat stranding which does not appear centered upon a focal diverticulum. Findings are favored to represent an acute infectious, inflammatory or vascular colitis. 2. Small volume free fluid in the pelvis, favor reactive given simple attenuation (-1 HU). 3. Dense consolidative opacity in the left lung base with trace left pleural effusion and some surrounding atelectasis. Findings are concerning for pneumonia. Recommend follow-up chest CT in 6-8 weeks following appropriate therapy to ensure resolution. 4. Asymmetric left renal atrophy with extensive areas of cortical scarring. Nonobstructing left nephrolithiasis. 5. Extensive coronary artery calcifications. 6. Postsurgical changes of prior abdominal aortic aneurysm repair incompletely assessed on this exam in the absence of intravenous  contrast media. 7. Prior L3-L5 posterior spinal decompression and fusion with interbody spacer placement. No evidence of acute hardware complication or failure. 8. Aortic Atherosclerosis (ICD10-I70.0). Electronically Signed   By: Lovena Le M.D.   On: 02/08/2020 20:27        Scheduled Meds: . amitriptyline  25 mg Oral QHS  . atorvastatin  10 mg Oral Daily  . insulin aspart  0-15 Units Subcutaneous TID WC  . insulin aspart  0-5 Units Subcutaneous QHS  . metoprolol tartrate  50 mg Oral BID  . multivitamin with minerals  1 tablet Oral Daily  . olopatadine  1 drop Both Eyes Daily  . pantoprazole  40 mg Oral Daily  . potassium chloride  10 mEq Oral Daily  . pregabalin  300 mg Oral BID  . vitamin B-12  1,000 mcg Oral Daily   Continuous Infusions: . sodium chloride 100 mL/hr at 02/09/20 1100  . ceFEPime (MAXIPIME) IV 2 g (02/09/20 1323)  . metronidazole 500 mg (02/09/20 1532)     LOS: 1 day    Time spent: 30 minutes    Barb Merino, MD [facility] Pager [phone]

## 2020-02-09 NOTE — Progress Notes (Addendum)
Pharmacy Antibiotic Note  [name] is a 79 y.o. male admitted on 02/08/2020 with Intra-abdominal infection.  Pharmacy has been consulted for cefepime dosing.  Flagyl per MD.    Renal function improving. No current weight but in March 2021 was 190lbs. ClCr ~50 ml/min. Will increase cefepime  Plan: Increase cefepime to 2g IV every 12 hours Continue metronidazole per MD  Monitor renal function, cultures, clinical progression and LOT F/u GI workup and ability to narrow   Temp (24hrs), Avg:98.6 F (37 C), Min:97.7 F (36.5 C), Max:99.4 F (37.4 C)  Recent Labs  Lab 02/08/20 1621 02/08/20 1930 02/08/20 2300 02/09/20 0414  WBC 9.0  --   --  17.5*  CREATININE 1.77*  --   --  1.27*  LATICACIDVEN 2.9* 2.4* 1.5  --     Antimicrobials  Cefepime 4/13>> MTZ 4/13>>   Microbiology 4/14 Bcx: pending 4/14 Stool panel: pending 4/14 Cdiff neg 4/13 Flu/covid neg    Benetta Spar, PharmD, BCPS, [place] Clinical Pharmacist  Please check AMION for all [place] phone numbers After 10:00 PM, call [place] [phone]

## 2020-02-09 NOTE — ED Notes (Signed)
Attempted report x 1. Nurse is giving blood

## 2020-02-10 ENCOUNTER — Inpatient Hospital Stay (HOSPITAL_COMMUNITY): Payer: Medicare Other

## 2020-02-10 LAB — COMPREHENSIVE METABOLIC PANEL
ALT: 21 U/L (ref 0–44)
AST: 18 U/L (ref 15–41)
Albumin: 2.4 g/dL — ABNORMAL LOW (ref 3.5–5.0)
Alkaline Phosphatase: 50 U/L (ref 38–126)
Anion gap: 13 (ref 5–15)
BUN: 22 mg/dL (ref 8–23)
CO2: 22 mmol/L (ref 22–32)
Calcium: 8.5 mg/dL — ABNORMAL LOW (ref 8.9–10.3)
Chloride: 103 mmol/L (ref 98–111)
Creatinine, Ser: 1.14 mg/dL (ref 0.61–1.24)
GFR calc Af Amer: >60 mL/min (ref >60)
GFR calc non Af Amer: >60 mL/min (ref >60)
Glucose, Bld: 161 mg/dL — ABNORMAL HIGH (ref 70–99)
Potassium: 4 mmol/L (ref 3.5–5.1)
Sodium: 138 mmol/L (ref 135–145)
Total Bilirubin: 1 mg/dL (ref 0.3–1.2)
Total Protein: 6 g/dL — ABNORMAL LOW (ref 6.5–8.1)

## 2020-02-10 LAB — GASTROINTESTINAL PANEL BY PCR, STOOL (REPLACES STOOL CULTURE)

## 2020-02-10 LAB — CBC WITH DIFFERENTIAL/PLATELET
Abs Immature Granulocytes: 0.2 10*3/uL — ABNORMAL HIGH (ref 0.00–0.07)
Basophils Absolute: 0 10*3/uL (ref 0.0–0.1)
Basophils Relative: 0 %
Eosinophils Absolute: 0 10*3/uL (ref 0.0–0.5)
Eosinophils Relative: 0 %
HCT: 36.2 % — ABNORMAL LOW (ref 39.0–52.0)
Hemoglobin: 12.2 g/dL — ABNORMAL LOW (ref 13.0–17.0)
Immature Granulocytes: 2 %
Lymphocytes Relative: 5 %
Lymphs Abs: 0.7 10*3/uL (ref 0.7–4.0)
MCH: 31.5 pg (ref 26.0–34.0)
MCHC: 33.7 g/dL (ref 30.0–36.0)
MCV: 93.5 fL (ref 80.0–100.0)
Monocytes Absolute: 1.2 10*3/uL — ABNORMAL HIGH (ref 0.1–1.0)
Monocytes Relative: 9 %
Neutro Abs: 10.7 10*3/uL — ABNORMAL HIGH (ref 1.7–7.7)
Neutrophils Relative %: 84 %
Platelets: 159 10*3/uL (ref 150–400)
RBC: 3.87 MIL/uL — ABNORMAL LOW (ref 4.22–5.81)
RDW: 13.7 % (ref 11.5–15.5)
WBC: 12.8 10*3/uL — ABNORMAL HIGH (ref 4.0–10.5)
nRBC: 0 % (ref 0.0–0.2)

## 2020-02-10 LAB — GLUCOSE, CAPILLARY
Glucose-Capillary: 134 mg/dL — ABNORMAL HIGH (ref 70–99)
Glucose-Capillary: 133 mg/dL — ABNORMAL HIGH (ref 70–99)
Glucose-Capillary: 108 mg/dL — ABNORMAL HIGH (ref 70–99)
Glucose-Capillary: 94 mg/dL (ref 70–99)

## 2020-02-10 LAB — HEMOGLOBIN A1C
Hgb A1c MFr Bld: 8.4 % — ABNORMAL HIGH (ref 4.8–5.6)
Mean Plasma Glucose: 194 mg/dL

## 2020-02-10 LAB — MAGNESIUM: Magnesium: 1.9 mg/dL (ref 1.7–2.4)

## 2020-02-10 LAB — PHOSPHORUS: Phosphorus: 3.6 mg/dL (ref 2.5–4.6)

## 2020-02-10 MED ORDER — CALCIUM CARBONATE ANTACID 500 MG PO CHEW
1.0000 | CHEWABLE_TABLET | Freq: Three times a day (TID) | ORAL | Status: DC
  Administered 2020-02-10 – 2020-02-17 (×16): 200 mg via ORAL
  Filled 2020-02-10 (×18): qty 1

## 2020-02-10 MED ORDER — METOPROLOL TARTRATE 5 MG/5ML IV SOLN
2.5000 mg | Freq: Four times a day (QID) | INTRAVENOUS | Status: DC
  Administered 2020-02-11 – 2020-02-15 (×17): 2.5 mg via INTRAVENOUS
  Filled 2020-02-10 (×18): qty 5

## 2020-02-10 NOTE — Plan of Care (Signed)
Plan of care reviewed with pt at bedside. Pt's abdomen was distended. Pt went for scan. Upon arrival back to floor, NG tube was placed and to low intermittent wall suction. 2L of stomach juice out. FMS pulled. Pt more comfortable now. Resting in bed. Call bell in reach, safety measures in place. Will continue to monitor.  Problem: Education: Goal: Knowledge of General Education information will improve Description: Including pain rating scale, medication(s)/side effects and non-pharmacologic comfort measures Outcome: Progressing   Problem: Health Behavior/Discharge Planning: Goal: Ability to manage health-related needs will improve Outcome: Progressing   Problem: Clinical Measurements: Goal: Ability to maintain clinical measurements within normal limits will improve Outcome: Progressing Goal: Will remain free from infection Outcome: Progressing Goal: Diagnostic test results will improve Outcome: Progressing Goal: Respiratory complications will improve Outcome: Progressing Goal: Cardiovascular complication will be avoided Outcome: Progressing   Problem: Activity: Goal: Risk for activity intolerance will decrease Outcome: Progressing   Problem: Nutrition: Goal: Adequate nutrition will be maintained Outcome: Progressing   Problem: Coping: Goal: Level of anxiety will decrease Outcome: Progressing   Problem: Elimination: Goal: Will not experience complications related to bowel motility Outcome: Progressing Goal: Will not experience complications related to urinary retention Outcome: Progressing   Problem: Pain Managment: Goal: General experience of comfort will improve Outcome: Progressing   Problem: Safety: Goal: Ability to remain free from injury will improve Outcome: Progressing   Problem: Skin Integrity: Goal: Risk for impaired skin integrity will decrease Outcome: Progressing

## 2020-02-10 NOTE — Progress Notes (Signed)
PROGRESS NOTE    [name]  VQM:[id number] DOB: [birth year] DOA: 02/08/2020 PCP: [name], MD    Brief Narrative:  79 year old gentleman with history of orthostatic hypotension, AAA repair, GERD, coronary artery disease, hyperlipidemia, hypertension, ischemic cardiomyopathy and diabetes, paroxysmal A. fib on Xarelto presented to the emergency room with dizziness and near passing out spells.  Patient felt weak and dizzy.  When EMS arrived his systolic blood pressures were 60s.  Patient also started having explosive diarrhea. In the emergency room, afebrile.  Blood pressures initially 61/46 with heart rate of 86 that responded well to fluid resuscitation.  Oxygen 87% on room air.  Lactic acid 2.9.  Rest of the chemistry and CBC were unremarkable.  Chest x-ray showed CHF with cardiomegaly mostly chronic.  CT scan of the abdomen pelvis showed focally thickened segment of the distal descending colon and proximal sigmoid colon with surrounding pericolonic fatty stranding.  Admitted with fluid and antibiotics.   Assessment & Plan:   Principal Problem:   Acute colitis Active Problems:   Hypertension   Sleep apnea   DM (diabetes mellitus), type 2, uncontrolled w/neurologic complication (HCC)   Paroxysmal atrial fibrillation with RVR (HCC)   Coronary artery disease involving native coronary artery of native heart with angina pectoris (HCC)   Chronic combined systolic and diastolic heart failure ([place])   Long term current use of anticoagulant therapy   Hyperlipidemia   AKI (acute kidney injury) ([place])   Near syncope   Hypotension  Sepsis present on admission probably due to acute colitis: Infective versus ischemic colitis. Sepsis is resolving.  Diarrhea is subsiding. Blood pressures responded adequate, continue IV fluids. CT scan with no evidence of obstruction, tolerating liquids, will advance to full liquid diet. CT pain GI pathogen panel negative. We will continue cefepime and Flagyl  until clinical improvement. Lactic acid normalized. Last colonoscopy 5 years ago with no obvious findings.  Will refer to GI on discharge.  Abnormal chest x-ray/abnormal CT scan of the chest: Unknown whether acute or chronic findings.  Patient does not have any pulmonary symptoms.  We will continue 7 days of antibiotics.  Syncope/vasovagal: Suspect due to dehydration with orthostasis.  Patient does have history of chronic orthostatic changes.  Discontinue ACE inhibitor's.  Continue IV and oral hydration.  Will monitor.  Check orthostatic vitals.  Hypertension: Hypotensive on arrival.  Discontinue antihypertensives.  Paroxysmal A. fib: Rate controlled.  In sinus rhythm now.  Will hold Xarelto given blood mixed stool.  Coronary artery disease with history of ischemic cardiomyopathy: Currently stable.  Acute kidney injury: Continues to respond.  Creatinine trending down.  Continue monitoring.  Recheck levels in the morning.  Advance to full liquid diet.  Mobilize.   DVT prophylaxis: Xarelto Code Status: Full code Family Communication: Wife at the bedside 4/14.  Will call her with updates. Disposition Plan: patient is from home. Anticipated DC to home, Barriers to discharge active diarrhea, ischemic colitis, on antibiotics, final results pending and improvement pending.   Consultants:   None  Procedures:   None  Antimicrobials:  Antibiotics Given (last 72 hours)    Date/Time Action Medication Dose Rate   02/08/20 2314 New Bag/Given   ceFEPIme (MAXIPIME) 2 g in sodium chloride 0.9 % 100 mL IVPB 2 g 200 mL/hr   02/08/20 2316 New Bag/Given   metroNIDAZOLE (FLAGYL) IVPB 500 mg 500 mg 100 mL/hr   02/09/20 1028 New Bag/Given   metroNIDAZOLE (FLAGYL) IVPB 500 mg 500 mg 100 mL/hr   02/09/20  1323 New Bag/Given   ceFEPIme (MAXIPIME) 2 g in sodium chloride 0.9 % 100 mL IVPB 2 g 200 mL/hr   02/09/20 1532 New Bag/Given   metroNIDAZOLE (FLAGYL) IVPB 500 mg 500 mg 100 mL/hr   02/09/20 2150  New Bag/Given   ceFEPIme (MAXIPIME) 2 g in sodium chloride 0.9 % 100 mL IVPB 2 g 200 mL/hr   02/10/20 0228 New Bag/Given   metroNIDAZOLE (FLAGYL) IVPB 500 mg 500 mg 100 mL/hr   02/10/20 0750 New Bag/Given   metroNIDAZOLE (FLAGYL) IVPB 500 mg 500 mg 100 mL/hr   02/10/20 1146 New Bag/Given   ceFEPIme (MAXIPIME) 2 g in sodium chloride 0.9 % 100 mL IVPB 2 g 200 mL/hr         Subjective: Patient seen and examined.  No overnight events.  No more nausea but he feels bloated and thinks that sometimes may help.  Eager to try some food. 20 mL stool output since morning , will remove the FMS  Objective: Vitals:   02/09/20 1200 02/09/20 2126 02/09/20 2358 02/10/20 0419  BP: 110/61 118/64 129/63 119/67  Pulse: 96 92 84 80  Resp: 14 16 20 18   Temp: 98.4 F (36.9 C) 98.1 F (36.7 C) 98.6 F (37 C) 98.5 F (36.9 C)  TempSrc: Oral Oral Oral Oral  SpO2: 96% 97% 97% 98%    Intake/Output Summary (Last 24 hours) at 02/10/2020 1202 Last data filed at 02/10/2020 1046 Gross per 24 hour  Intake 2879.32 ml  Output 971 ml  Net 1908.32 ml   There were no vitals filed for this visit.  Examination:  General exam: Appears comfortable today.  More alert and hydrated. Respiratory system: Clear to auscultation. Respiratory effort normal.  No added sounds. Cardiovascular system: S1 & S2 heard, RRR. No JVD, murmurs, rubs, gallops or clicks.  Shiny lower extremities with trace edema on the dorsum of the foot. Gastrointestinal system: Abdomen is distended, nontender.  No localized rigidity or guarding.  Bowel sounds present. Central nervous system: Alert and oriented. No focal neurological deficits. Extremities: Symmetric 5 x 5 power. Skin: No rashes, lesions or ulcers Psychiatry: Judgement and insight appear normal. Mood & affect appropriate.     Data Reviewed: I have personally reviewed following labs and imaging studies  CBC: Recent Labs  Lab 02/08/20 1621 02/09/20 0414 02/10/20 0719  WBC  9.0 17.5* 12.8*  NEUTROABS  --   --  10.7*  HGB 14.4 14.0 12.2*  HCT 42.6 42.7 36.2*  MCV 94.0 95.3 93.5  PLT 249 180 500   Basic Metabolic Panel: Recent Labs  Lab 02/08/20 1621 02/09/20 0414 02/10/20 0719  NA 140 139 138  K 4.5 4.4 4.0  CL 103 103 103  CO2 24 23 22   GLUCOSE 268* 144* 161*  BUN 34* 27* 22  CREATININE 1.77* 1.27* 1.14  CALCIUM 8.9 8.9 8.5*  MG  --   --  1.9  PHOS  --   --  3.6   GFR: CrCl cannot be calculated (Unknown ideal weight.). Liver Function Tests: Recent Labs  Lab 02/08/20 1621 02/09/20 0414 02/10/20 0719  AST 23 19 18   ALT 22 21 21   ALKPHOS 72 59 50  BILITOT 0.9 0.6 1.0  PROT 6.6 6.6 6.0*  ALBUMIN 2.8* 2.8* 2.4*   No results for input(s): LIPASE, AMYLASE in the last 168 hours. No results for input(s): AMMONIA in the last 168 hours. Coagulation Profile: No results for input(s): INR, PROTIME in the last 168 hours. Cardiac Enzymes: No  results for input(s): CKTOTAL, CKMB, CKMBINDEX, TROPONINI in the last 168 hours. BNP (last 3 results) No results for input(s): PROBNP in the last 8760 hours. HbA1C: Recent Labs    02/09/20 0414  HGBA1C 8.4*   CBG: Recent Labs  Lab 02/09/20 0758 02/09/20 1300 02/09/20 1659 02/09/20 2128 02/10/20 0742  GLUCAP 154* 127* 122* 135* 134*   Lipid Profile: No results for input(s): CHOL, HDL, LDLCALC, TRIG, CHOLHDL, LDLDIRECT in the last 72 hours. Thyroid Function Tests: Recent Labs    02/09/20 0414  TSH 0.319*   Anemia Panel: No results for input(s): VITAMINB12, FOLATE, FERRITIN, TIBC, IRON, RETICCTPCT in the last 72 hours. Sepsis Labs: Recent Labs  Lab 02/08/20 1621 02/08/20 1930 02/08/20 2300  LATICACIDVEN 2.9* 2.4* 1.5    Recent Results (from the past 240 hour(s))  Respiratory Panel by RT PCR (Flu A&B, Covid) - Nasopharyngeal Swab     Status: None   Collection Time: 02/08/20  4:52 PM   Specimen: Nasopharyngeal Swab  Result Value Ref Range Status   SARS Coronavirus 2 by RT PCR  NEGATIVE NEGATIVE Final    Comment: (NOTE) SARS-CoV-2 target nucleic acids are NOT DETECTED. The SARS-CoV-2 RNA is generally detectable in upper respiratoy specimens during the acute phase of infection. The lowest concentration of SARS-CoV-2 viral copies this assay can detect is 131 copies/mL. A negative result does not preclude SARS-Cov-2 infection and should not be used as the sole basis for treatment or other patient management decisions. A negative result may occur with  improper specimen collection/handling, submission of specimen other than nasopharyngeal swab, presence of viral mutation(s) within the areas targeted by this assay, and inadequate number of viral copies (<131 copies/mL). A negative result must be combined with clinical observations, patient history, and epidemiological information. The expected result is Negative. Fact Sheet for Patients:  PinkCheek.be Fact Sheet for Healthcare Providers:  GravelBags.it This test is not yet ap proved or cleared by the [place] FDA and  has been authorized for detection and/or diagnosis of SARS-CoV-2 by FDA under an Emergency Use Authorization (EUA). This EUA will remain  in effect (meaning this test can be used) for the duration of the COVID-19 declaration under Section 564(b)(1) of the Act, 21 U.S.C. section 360bbb-3(b)(1), unless the authorization is terminated or revoked sooner.    Influenza A by PCR NEGATIVE NEGATIVE Final   Influenza B by PCR NEGATIVE NEGATIVE Final    Comment: (NOTE) The Xpert Xpress SARS-CoV-2/FLU/RSV assay is intended as an aid in  the diagnosis of influenza from Nasopharyngeal swab specimens and  should not be used as a sole basis for treatment. Nasal washings and  aspirates are unacceptable for Xpert Xpress SARS-CoV-2/FLU/RSV  testing. Fact Sheet for Patients: PinkCheek.be Fact Sheet for Healthcare  Providers: GravelBags.it This test is not yet approved or cleared by the [place] FDA and  has been authorized for detection and/or diagnosis of SARS-CoV-2 by  FDA under an Emergency Use Authorization (EUA). This EUA will remain  in effect (meaning this test can be used) for the duration of the  Covid-19 declaration under Section 564(b)(1) of the Act, 21  U.S.C. section 360bbb-3(b)(1), unless the authorization is  terminated or revoked. Performed at [facility], [place] [address], [place], [place] [address]   Blood Culture (routine x 2)     Status: None (Preliminary result)   Collection Time: 02/08/20 10:48 PM   Specimen: BLOOD  Result Value Ref Range Status   Specimen Description BLOOD RIGHT ARM  Final   Special Requests   Final    BOTTLES DRAWN AEROBIC AND ANAEROBIC Blood Culture adequate volume Performed at [facility], [place] [address], [place], [place] [address]    Culture NO GROWTH < 12 HOURS  Final   Report Status PENDING  Incomplete  Blood Culture (routine x 2)     Status: None (Preliminary result)   Collection Time: 02/08/20 11:08 PM   Specimen: BLOOD  Result Value Ref Range Status   Specimen Description BLOOD RIGHT FOREARM  Final   Special Requests   Final    BOTTLES DRAWN AEROBIC AND ANAEROBIC Blood Culture adequate volume Performed at [facility], [place] [address], [place], [place] [address]    Culture NO GROWTH < 12 HOURS  Final   Report Status PENDING  Incomplete  Gastrointestinal Panel by PCR , Stool     Status: None   Collection Time: 02/09/20  4:26 AM   Specimen: Stool  Result Value Ref Range Status   Campylobacter species NOT DETECTED NOT DETECTED Final   Plesimonas shigelloides NOT DETECTED NOT DETECTED Final   Salmonella species NOT DETECTED NOT DETECTED Final   Yersinia enterocolitica NOT DETECTED NOT DETECTED Final   Vibrio species NOT DETECTED NOT DETECTED Final   Vibrio cholerae NOT DETECTED NOT DETECTED  Final   Enteroaggregative E coli (EAEC) NOT DETECTED NOT DETECTED Final   Enteropathogenic E coli (EPEC) NOT DETECTED NOT DETECTED Final   Enterotoxigenic E coli (ETEC) NOT DETECTED NOT DETECTED Final   Shiga like toxin producing E coli (STEC) NOT DETECTED NOT DETECTED Final   Shigella/Enteroinvasive E coli (EIEC) NOT DETECTED NOT DETECTED Final   Cryptosporidium NOT DETECTED NOT DETECTED Final   Cyclospora cayetanensis NOT DETECTED NOT DETECTED Final   Entamoeba histolytica NOT DETECTED NOT DETECTED Final   Giardia lamblia NOT DETECTED NOT DETECTED Final   Adenovirus F40/41 NOT DETECTED NOT DETECTED Final   Astrovirus NOT DETECTED NOT DETECTED Final   Norovirus GI/GII NOT DETECTED NOT DETECTED Final   Rotavirus A NOT DETECTED NOT DETECTED Final   Sapovirus (I, II, IV, and V) NOT DETECTED NOT DETECTED Final    Comment: Performed at [facility], [place] [place], [place] [address]  C Difficile Quick Screen w PCR reflex     Status: None   Collection Time: 02/09/20  4:27 AM   Specimen: Stool  Result Value Ref Range Status   C Diff antigen NEGATIVE NEGATIVE Final   C Diff toxin NEGATIVE NEGATIVE Final   C Diff interpretation No C. difficile detected.  Final    Comment: Performed at [facility], [place] [address], [place], [place] [address]         Radiology Studies: DG Chest Port 1 View  Result Date: 02/08/2020 CLINICAL DATA:  Weakness, hypotension EXAM: PORTABLE CHEST 1 VIEW COMPARISON:  Radiograph 09/30/2012 FINDINGS: Increasing hazy interstitial opacities within the lungs cardiomegaly and indistinct pulmonary vascularity may reflect mild interstitial edema. No consolidative opacity, pneumothorax or effusion. The aorta is calcified. The remaining cardiomediastinal contours are unremarkable. No acute osseous or soft tissue abnormality. Degenerative changes are present in the imaged spine and shoulders. Telemetry leads overlie the chest. IMPRESSION: Features  compatible with CHF/volume overload with cardiomegaly and interstitial opacities favoring edema. Electronically Signed   By: Lovena Le M.D.   On: 02/08/2020 17:23   CT Renal Stone Study  Result Date: 02/08/2020 CLINICAL DATA:  Syncopal event EXAM: CT ABDOMEN AND PELVIS WITHOUT CONTRAST TECHNIQUE:  Multidetector CT imaging of the abdomen and pelvis was performed following the standard protocol without IV contrast. COMPARISON:  Radiograph 07/08/2019, CT 07/04/2019 FINDINGS: Lower chest: There is a dense consolidative opacity in the left lung base with trace left pleural effusion and some surrounding atelectasis. The right lung base is predominantly clear aside from some basilar atelectatic changes as well. Normal heart size. No pericardial effusion. Extensive coronary artery calcifications. Hepatobiliary: No focal liver abnormality is seen. Patient is post cholecystectomy. Slight prominence of the biliary tree likely related to reservoir effect. No calcified intraductal gallstones. Pancreas: Unremarkable. No pancreatic ductal dilatation or surrounding inflammatory changes. Spleen: Normal in size without focal abnormality. Adrenals/Urinary Tract: Normal adrenal glands. Mild bilateral symmetric perinephric stranding, a nonspecific finding though may correlate with either age or decreased renal function. Asymmetric left renal atrophy with extensive areas of cortical scarring. Several fluid attenuation cysts are noted in the right kidney as well as a more intermediate partially exophytic 2.2 cm lesion arising from the posterior interpolar right kidney (3/45). Nonobstructing calculus present in the collecting system of the left lower pole. Additional vascular calcifications noted in both renal pelves. No hydronephrosis or obstructive urolithiasis. Mild bladder distension without other gross abnormality. Stomach/Bowel: Distal esophagus, stomach and duodenal sweep are unremarkable. No small bowel wall thickening or  dilatation. No evidence of obstruction. A normal appendix is visualized. Proximal colon is unremarkable. There is a focally thickened segment of the distal descending colon and proximal sigmoid with surrounding pericolonic fat stranding which does not appear centered upon a focal diverticulum. Vascular/Lymphatic: Extensive atherosclerotic calcification of the abdominal aorta and branch vessels. Slight fusiform ectasia of the infrarenal abdominal aorta is likely related to prior abdominal aortic aneurysm repair incompletely assessed on this exam examination in the absence of intravenous contrast media. Some reactive adenopathy noted in the low pelvis and mesentery. Reproductive: Prostate at the upper limits of normal for size. No acute CT abnormality. Other: No bowel containing hernias. No abdominopelvic free air. Small volume free fluid in the pelvis, favor reactive given simple attenuation (-1 HU). Musculoskeletal: Prior L3-L5 posterior spinal decompression and fusion with interbody spacer placement. No evidence of acute hardware complication or failure. IMPRESSION: 1. Focally thickened segment of the distal descending colon and proximal sigmoid with surrounding pericolonic fat stranding which does not appear centered upon a focal diverticulum. Findings are favored to represent an acute infectious, inflammatory or vascular colitis. 2. Small volume free fluid in the pelvis, favor reactive given simple attenuation (-1 HU). 3. Dense consolidative opacity in the left lung base with trace left pleural effusion and some surrounding atelectasis. Findings are concerning for pneumonia. Recommend follow-up chest CT in 6-8 weeks following appropriate therapy to ensure resolution. 4. Asymmetric left renal atrophy with extensive areas of cortical scarring. Nonobstructing left nephrolithiasis. 5. Extensive coronary artery calcifications. 6. Postsurgical changes of prior abdominal aortic aneurysm repair incompletely assessed on  this exam in the absence of intravenous contrast media. 7. Prior L3-L5 posterior spinal decompression and fusion with interbody spacer placement. No evidence of acute hardware complication or failure. 8. Aortic Atherosclerosis (ICD10-I70.0). Electronically Signed   By: Lovena Le M.D.   On: 02/08/2020 20:27        Scheduled Meds: . amitriptyline  25 mg Oral QHS  . atorvastatin  10 mg Oral Daily  . calcium carbonate  1 tablet Oral TID WC  . insulin aspart  0-15 Units Subcutaneous TID WC  . insulin aspart  0-5 Units Subcutaneous QHS  . metoprolol tartrate  50 mg Oral BID  . multivitamin with minerals  1 tablet Oral Daily  . olopatadine  1 drop Both Eyes Daily  . pantoprazole  40 mg Oral Daily  . potassium chloride  10 mEq Oral Daily  . pregabalin  300 mg Oral BID  . vitamin B-12  1,000 mcg Oral Daily   Continuous Infusions: . sodium chloride Stopped (02/10/20 0228)  . ceFEPime (MAXIPIME) IV 2 g (02/10/20 1146)  . metronidazole 500 mg (02/10/20 0750)     LOS: 2 days    Time spent: 30 minutes    Barb Merino, MD [facility] Pager [phone]

## 2020-02-10 NOTE — Progress Notes (Signed)
Patient continues to feel hydrated, wanting to eat. Diarrhea stopped. However, by later afternoon he started feeling more distended with no vomiting or abdominal pain. KUB showed hugely distended stomach, rest of the bowel normal with no megacolon or obstruction. Abdomen is soft, no rigidity or guarding. Almost absent bowel sounds.   A/P: developing ileus with distended stomach? No clinical evidence of peritonitis or acute abdomen.  NPO Bowel rest Ng tube decompression Recheck and correct electrolytes  Serial abdominal exam and KUB in AM. Discussed with Gastroenterology, Dr Penelope Coop, suggested conservative management and close monitoring.

## 2020-02-11 LAB — CBC WITH DIFFERENTIAL/PLATELET
Abs Immature Granulocytes: 0.12 10*3/uL — ABNORMAL HIGH (ref 0.00–0.07)
Basophils Absolute: 0 10*3/uL (ref 0.0–0.1)
Basophils Relative: 0 %
Eosinophils Absolute: 0 10*3/uL (ref 0.0–0.5)
Eosinophils Relative: 0 %
HCT: 36.2 % — ABNORMAL LOW (ref 39.0–52.0)
Hemoglobin: 11.9 g/dL — ABNORMAL LOW (ref 13.0–17.0)
Immature Granulocytes: 1 %
Lymphocytes Relative: 10 %
Lymphs Abs: 0.9 10*3/uL (ref 0.7–4.0)
MCH: 31.1 pg (ref 26.0–34.0)
MCHC: 32.9 g/dL (ref 30.0–36.0)
MCV: 94.5 fL (ref 80.0–100.0)
Monocytes Absolute: 1.4 10*3/uL — ABNORMAL HIGH (ref 0.1–1.0)
Monocytes Relative: 15 %
Neutro Abs: 6.9 10*3/uL (ref 1.7–7.7)
Neutrophils Relative %: 74 %
Platelets: 175 10*3/uL (ref 150–400)
RBC: 3.83 MIL/uL — ABNORMAL LOW (ref 4.22–5.81)
RDW: 13.9 % (ref 11.5–15.5)
WBC: 9.3 10*3/uL (ref 4.0–10.5)
nRBC: 0 % (ref 0.0–0.2)

## 2020-02-11 LAB — COMPREHENSIVE METABOLIC PANEL
ALT: 23 U/L (ref 0–44)
AST: 20 U/L (ref 15–41)
Albumin: 2.4 g/dL — ABNORMAL LOW (ref 3.5–5.0)
Alkaline Phosphatase: 49 U/L (ref 38–126)
Anion gap: 10 (ref 5–15)
BUN: 20 mg/dL (ref 8–23)
CO2: 20 mmol/L — ABNORMAL LOW (ref 22–32)
Calcium: 8.5 mg/dL — ABNORMAL LOW (ref 8.9–10.3)
Chloride: 110 mmol/L (ref 98–111)
Creatinine, Ser: 1.04 mg/dL (ref 0.61–1.24)
GFR calc Af Amer: >60 mL/min (ref >60)
GFR calc non Af Amer: >60 mL/min (ref >60)
Glucose, Bld: 107 mg/dL — ABNORMAL HIGH (ref 70–99)
Potassium: 3.7 mmol/L (ref 3.5–5.1)
Sodium: 140 mmol/L (ref 135–145)
Total Bilirubin: 0.9 mg/dL (ref 0.3–1.2)
Total Protein: 5.9 g/dL — ABNORMAL LOW (ref 6.5–8.1)

## 2020-02-11 LAB — GLUCOSE, CAPILLARY
Glucose-Capillary: 94 mg/dL (ref 70–99)
Glucose-Capillary: 138 mg/dL — ABNORMAL HIGH (ref 70–99)
Glucose-Capillary: 112 mg/dL — ABNORMAL HIGH (ref 70–99)
Glucose-Capillary: 90 mg/dL (ref 70–99)

## 2020-02-11 LAB — PHOSPHORUS: Phosphorus: 1.8 mg/dL — ABNORMAL LOW (ref 2.5–4.6)

## 2020-02-11 LAB — MAGNESIUM: Magnesium: 1.8 mg/dL (ref 1.7–2.4)

## 2020-02-11 MED ORDER — POTASSIUM PHOSPHATES 15 MMOLE/5ML IV SOLN
20.0000 mmol | Freq: Once | INTRAVENOUS | Status: AC
  Administered 2020-02-11: 08:00:00 20 mmol via INTRAVENOUS
  Filled 2020-02-11: qty 6.67

## 2020-02-11 MED ORDER — MORPHINE SULFATE (PF) 2 MG/ML IV SOLN
INTRAVENOUS | Status: AC
  Filled 2020-02-11: qty 1

## 2020-02-11 MED ORDER — PHENOL 1.4 % MT LIQD
1.0000 | OROMUCOSAL | Status: DC | PRN
  Filled 2020-02-11: qty 177

## 2020-02-11 MED ORDER — MORPHINE SULFATE (PF) 2 MG/ML IV SOLN
1.0000 mg | INTRAVENOUS | Status: DC | PRN
  Administered 2020-02-11 – 2020-02-12 (×3): 2 mg via INTRAVENOUS
  Filled 2020-02-11 (×2): qty 1

## 2020-02-11 NOTE — Progress Notes (Signed)
PROGRESS NOTE    [name]  GMW:[id number] DOB: [birth year] DOA: 02/08/2020 PCP: [name], MD    Brief Narrative:  79 year old gentleman with history of orthostatic hypotension, AAA repair, GERD, coronary artery disease, hyperlipidemia, hypertension, ischemic cardiomyopathy and diabetes, paroxysmal A. fib on Xarelto presented to the emergency room with dizziness and near passing out spells.  Patient felt weak and dizzy.  When EMS arrived his systolic blood pressures were 60s.  Patient also started having explosive diarrhea.  In the emergency room, afebrile.  Blood pressures initially 61/46 with heart rate of 86 that responded well to fluid resuscitation.  Oxygen 87% on room air.  Lactic acid 2.9.  Rest of the chemistry and CBC were unremarkable.  Chest x-ray showed CHF with cardiomegaly mostly chronic.  CT scan of the abdomen pelvis showed focally thickened segment of the distal descending colon and proximal sigmoid colon with surrounding pericolonic fatty stranding.  Admitted with fluid and antibiotics.  4/15, diarrhea improved but patient developed abdominal distention and ileus.  A KUB x-ray showed huge stomach with no intestinal obstruction.  NG tube was placed, 2 L gastric secretions out with clinical improvement.   Assessment & Plan:   Principal Problem:   Acute colitis Active Problems:   Hypertension   Sleep apnea   DM (diabetes mellitus), type 2, uncontrolled w/neurologic complication (HCC)   Paroxysmal atrial fibrillation with RVR (HCC)   Coronary artery disease involving native coronary artery of native heart with angina pectoris (HCC)   Chronic combined systolic and diastolic heart failure ([place])   Long term current use of anticoagulant therapy   Hyperlipidemia   AKI (acute kidney injury) ([place])   Near syncope   Hypotension  Sepsis present on admission probably due to acute colitis: Infective versus ischemic colitis. Sepsis is resolving.  Diarrhea improved. Blood  pressures responded adequate, continue IV fluids. CT scan with no evidence of obstruction, tolerating liquids, will advance to full liquid diet. C diff and GI pathogen panel negative. We will continue cefepime and Flagyl until clinical improvement. Lactic acid normalized. Last colonoscopy 5 years ago with no obvious findings.  Will refer to GI on discharge.  Ileus/gastric distention: Patient developed significant ileus with gastric distention.  NG tube, more than 2 L drained.  Symptomatically improving.  No bowel movements yet. Clamp NG tube, use sips with meds, mobilize.  Keep NPO. If more distention or swelling, will put NG tube and suction.  Abnormal chest x-ray/abnormal CT scan of the chest: Unknown whether acute or chronic findings.  Patient does not have any pulmonary symptoms.  We will continue 7 days of antibiotics.  Syncope/vasovagal: Suspect due to dehydration with orthostasis.  Patient does have history of chronic orthostatic changes.  Discontinue ACE inhibitor's.  Continue IV and oral hydration.  Will monitor.  Check orthostatic vitals.  Hypertension: Hypotensive on arrival.  Keeping off antihypertensives.  Paroxysmal A. fib: Rate controlled.  In sinus rhythm now.  Holding Xarelto in case patient needs any procedures.  Coronary artery disease with history of ischemic cardiomyopathy: Currently stable.  Acute kidney injury: Continues to respond.  Creatinine normalized.  Hypophosphatemia: Replace and monitor levels.  Mobilize.  Can come off the telemetry monitor.  DVT prophylaxis: Xarelto, on hold Code Status: Full code Family Communication: Wife at the bedside. Disposition Plan: patient is from home. Anticipated DC to home, Barriers to discharge, post diarrhea ileus.    Consultants:   None  Procedures:   None  Antimicrobials:  Antibiotics Given (last 72 hours)  Date/Time Action Medication Dose Rate   02/08/20 2314 New Bag/Given   ceFEPIme (MAXIPIME) 2 g in  sodium chloride 0.9 % 100 mL IVPB 2 g 200 mL/hr   02/08/20 2316 New Bag/Given   metroNIDAZOLE (FLAGYL) IVPB 500 mg 500 mg 100 mL/hr   02/09/20 1028 New Bag/Given   metroNIDAZOLE (FLAGYL) IVPB 500 mg 500 mg 100 mL/hr   02/09/20 1323 New Bag/Given   ceFEPIme (MAXIPIME) 2 g in sodium chloride 0.9 % 100 mL IVPB 2 g 200 mL/hr   02/09/20 1532 New Bag/Given   metroNIDAZOLE (FLAGYL) IVPB 500 mg 500 mg 100 mL/hr   02/09/20 2150 New Bag/Given   ceFEPIme (MAXIPIME) 2 g in sodium chloride 0.9 % 100 mL IVPB 2 g 200 mL/hr   02/10/20 0228 New Bag/Given   metroNIDAZOLE (FLAGYL) IVPB 500 mg 500 mg 100 mL/hr   02/10/20 0750 New Bag/Given   metroNIDAZOLE (FLAGYL) IVPB 500 mg 500 mg 100 mL/hr   02/10/20 1146 New Bag/Given   ceFEPIme (MAXIPIME) 2 g in sodium chloride 0.9 % 100 mL IVPB 2 g 200 mL/hr   02/10/20 1515 New Bag/Given   metroNIDAZOLE (FLAGYL) IVPB 500 mg 500 mg 100 mL/hr   02/10/20 2244 New Bag/Given   ceFEPIme (MAXIPIME) 2 g in sodium chloride 0.9 % 100 mL IVPB 2 g 200 mL/hr   02/11/20 0016 New Bag/Given   metroNIDAZOLE (FLAGYL) IVPB 500 mg 500 mg 100 mL/hr   02/11/20 0845 New Bag/Given   metroNIDAZOLE (FLAGYL) IVPB 500 mg 500 mg 100 mL/hr   02/11/20 1245 New Bag/Given   ceFEPIme (MAXIPIME) 2 g in sodium chloride 0.9 % 100 mL IVPB 2 g 200 mL/hr         Subjective: Patient seen and examined.  Felt much better overnight after NG tube and suction.  His feet hurts due to peripheral neuropathy. No flatus or bowel movement.  Objective: Vitals:   02/11/20 1240 02/11/20 1511 02/11/20 1514 02/11/20 1517  BP: (!) 103/59 114/60 107/67 (!) 101/59  Pulse: 78 74 80 90  Resp:  18 18 18   Temp:  98.1 F (36.7 C)    TempSrc:  Oral    SpO2:  94% 95% 98%    Intake/Output Summary (Last 24 hours) at 02/11/2020 1539 Last data filed at 02/10/2020 2300 Gross per 24 hour  Intake 1428.36 ml  Output 2350 ml  Net -921.64 ml   There were no vitals filed for this visit.  Examination:  General exam:  Appears comfortable today.  More alert and hydrated. Respiratory system: Clear to auscultation. Respiratory effort normal.  No added sounds. Cardiovascular system: S1 & S2 heard, RRR. No JVD, murmurs, rubs, gallops or clicks.  Shiny lower extremities with trace edema on the dorsum of the foot. Gastrointestinal system: Abdomen is nondistended.  Nontender.  No localized tenderness or rigidity. Bowel sounds absent. Central nervous system: Alert and oriented. No focal neurological deficits. Extremities: Symmetric 5 x 5 power. Skin: No rashes, lesions or ulcers Psychiatry: Judgement and insight appear normal. Mood & affect appropriate.     Data Reviewed: I have personally reviewed following labs and imaging studies  CBC: Recent Labs  Lab 02/08/20 1621 02/09/20 0414 02/10/20 0719 02/11/20 0244  WBC 9.0 17.5* 12.8* 9.3  NEUTROABS  --   --  10.7* 6.9  HGB 14.4 14.0 12.2* 11.9*  HCT 42.6 42.7 36.2* 36.2*  MCV 94.0 95.3 93.5 94.5  PLT 249 180 159 132   Basic Metabolic Panel: Recent Labs  Lab 02/08/20 1621  02/09/20 0414 02/10/20 0719 02/11/20 0244  NA 140 139 138 140  K 4.5 4.4 4.0 3.7  CL 103 103 103 110  CO2 24 23 22  20*  GLUCOSE 268* 144* 161* 107*  BUN 34* 27* 22 20  CREATININE 1.77* 1.27* 1.14 1.04  CALCIUM 8.9 8.9 8.5* 8.5*  MG  --   --  1.9 1.8  PHOS  --   --  3.6 1.8*   GFR: CrCl cannot be calculated (Unknown ideal weight.). Liver Function Tests: Recent Labs  Lab 02/08/20 1621 02/09/20 0414 02/10/20 0719 02/11/20 0244  AST 23 19 18 20   ALT 22 21 21 23   ALKPHOS 72 59 50 49  BILITOT 0.9 0.6 1.0 0.9  PROT 6.6 6.6 6.0* 5.9*  ALBUMIN 2.8* 2.8* 2.4* 2.4*   No results for input(s): LIPASE, AMYLASE in the last 168 hours. No results for input(s): AMMONIA in the last 168 hours. Coagulation Profile: No results for input(s): INR, PROTIME in the last 168 hours. Cardiac Enzymes: No results for input(s): CKTOTAL, CKMB, CKMBINDEX, TROPONINI in the last 168 hours. BNP  (last 3 results) No results for input(s): PROBNP in the last 8760 hours. HbA1C: Recent Labs    02/09/20 0414  HGBA1C 8.4*   CBG: Recent Labs  Lab 02/10/20 1213 02/10/20 1700 02/10/20 2125 02/11/20 0820 02/11/20 1222  GLUCAP 133* 108* 94 94 138*   Lipid Profile: No results for input(s): CHOL, HDL, LDLCALC, TRIG, CHOLHDL, LDLDIRECT in the last 72 hours. Thyroid Function Tests: Recent Labs    02/09/20 0414  TSH 0.319*   Anemia Panel: No results for input(s): VITAMINB12, FOLATE, FERRITIN, TIBC, IRON, RETICCTPCT in the last 72 hours. Sepsis Labs: Recent Labs  Lab 02/08/20 1621 02/08/20 1930 02/08/20 2300  LATICACIDVEN 2.9* 2.4* 1.5    Recent Results (from the past 240 hour(s))  Respiratory Panel by RT PCR (Flu A&B, Covid) - Nasopharyngeal Swab     Status: None   Collection Time: 02/08/20  4:52 PM   Specimen: Nasopharyngeal Swab  Result Value Ref Range Status   SARS Coronavirus 2 by RT PCR NEGATIVE NEGATIVE Final    Comment: (NOTE) SARS-CoV-2 target nucleic acids are NOT DETECTED. The SARS-CoV-2 RNA is generally detectable in upper respiratoy specimens during the acute phase of infection. The lowest concentration of SARS-CoV-2 viral copies this assay can detect is 131 copies/mL. A negative result does not preclude SARS-Cov-2 infection and should not be used as the sole basis for treatment or other patient management decisions. A negative result may occur with  improper specimen collection/handling, submission of specimen other than nasopharyngeal swab, presence of viral mutation(s) within the areas targeted by this assay, and inadequate number of viral copies (<131 copies/mL). A negative result must be combined with clinical observations, patient history, and epidemiological information. The expected result is Negative. Fact Sheet for Patients:  PinkCheek.be Fact Sheet for Healthcare Providers:   GravelBags.it This test is not yet ap proved or cleared by the [place] FDA and  has been authorized for detection and/or diagnosis of SARS-CoV-2 by FDA under an Emergency Use Authorization (EUA). This EUA will remain  in effect (meaning this test can be used) for the duration of the COVID-19 declaration under Section 564(b)(1) of the Act, 21 U.S.C. section 360bbb-3(b)(1), unless the authorization is terminated or revoked sooner.    Influenza A by PCR NEGATIVE NEGATIVE Final   Influenza B by PCR NEGATIVE NEGATIVE Final    Comment: (NOTE) The Xpert Xpress SARS-CoV-2/FLU/RSV assay is intended as  an aid in  the diagnosis of influenza from Nasopharyngeal swab specimens and  should not be used as a sole basis for treatment. Nasal washings and  aspirates are unacceptable for Xpert Xpress SARS-CoV-2/FLU/RSV  testing. Fact Sheet for Patients: PinkCheek.be Fact Sheet for Healthcare Providers: GravelBags.it This test is not yet approved or cleared by the [place] FDA and  has been authorized for detection and/or diagnosis of SARS-CoV-2 by  FDA under an Emergency Use Authorization (EUA). This EUA will remain  in effect (meaning this test can be used) for the duration of the  Covid-19 declaration under Section 564(b)(1) of the Act, 21  U.S.C. section 360bbb-3(b)(1), unless the authorization is  terminated or revoked. Performed at [facility], [place] [address] [place], [place] [address]   Blood Culture (routine x 2)     Status: None (Preliminary result)   Collection Time: 02/08/20 10:48 PM   Specimen: BLOOD  Result Value Ref Range Status   Specimen Description BLOOD RIGHT ARM  Final   Special Requests   Final    BOTTLES DRAWN AEROBIC AND ANAEROBIC Blood Culture adequate volume   Culture   Final    NO GROWTH 2 DAYS Performed at [facility], [place] [address], [place], [place]  [address]    Report Status PENDING  Incomplete  Blood Culture (routine x 2)     Status: None (Preliminary result)   Collection Time: 02/08/20 11:08 PM   Specimen: BLOOD  Result Value Ref Range Status   Specimen Description BLOOD RIGHT FOREARM  Final   Special Requests   Final    BOTTLES DRAWN AEROBIC AND ANAEROBIC Blood Culture adequate volume   Culture   Final    NO GROWTH 2 DAYS Performed at [facility], [place] [address], [place], [place] [address]    Report Status PENDING  Incomplete  Gastrointestinal Panel by PCR , Stool     Status: None   Collection Time: 02/09/20  4:26 AM   Specimen: Stool  Result Value Ref Range Status   Campylobacter species NOT DETECTED NOT DETECTED Final   Plesimonas shigelloides NOT DETECTED NOT DETECTED Final   Salmonella species NOT DETECTED NOT DETECTED Final   Yersinia enterocolitica NOT DETECTED NOT DETECTED Final   Vibrio species NOT DETECTED NOT DETECTED Final   Vibrio cholerae NOT DETECTED NOT DETECTED Final   Enteroaggregative E coli (EAEC) NOT DETECTED NOT DETECTED Final   Enteropathogenic E coli (EPEC) NOT DETECTED NOT DETECTED Final   Enterotoxigenic E coli (ETEC) NOT DETECTED NOT DETECTED Final   Shiga like toxin producing E coli (STEC) NOT DETECTED NOT DETECTED Final   Shigella/Enteroinvasive E coli (EIEC) NOT DETECTED NOT DETECTED Final   Cryptosporidium NOT DETECTED NOT DETECTED Final   Cyclospora cayetanensis NOT DETECTED NOT DETECTED Final   Entamoeba histolytica NOT DETECTED NOT DETECTED Final   Giardia lamblia NOT DETECTED NOT DETECTED Final   Adenovirus F40/41 NOT DETECTED NOT DETECTED Final   Astrovirus NOT DETECTED NOT DETECTED Final   Norovirus GI/GII NOT DETECTED NOT DETECTED Final   Rotavirus A NOT DETECTED NOT DETECTED Final   Sapovirus (I, II, IV, and V) NOT DETECTED NOT DETECTED Final    Comment: Performed at [facility], [place] [place], [place] [address]  C Difficile Quick Screen w PCR reflex      Status: None   Collection Time: 02/09/20  4:27 AM   Specimen: Stool  Result Value Ref Range Status   C  Diff antigen NEGATIVE NEGATIVE Final   C Diff toxin NEGATIVE NEGATIVE Final   C Diff interpretation No C. difficile detected.  Final    Comment: Performed at [facility], [place] [address], [place], [place] [address]         Radiology Studies: DG Abd 1 View  Result Date: 02/10/2020 CLINICAL DATA:  79 year old male with abdominal distension. EXAM: ABDOMEN - 1 VIEW COMPARISON:  CT Abdomen and Pelvis 03/09/2020. FINDINGS: Supine views of the abdomen. New severe gaseous distension of the stomach from the CT 2 days ago. There is superimposed gas and other nondilated bowel loops in the abdomen and pelvis. Stable cholecystectomy clips. No acute osseous abnormality identified. Previous lower lumbar decompression and fusion. Vascular calcifications in the abdomen and pelvis. IMPRESSION: Severe gaseous distension of the stomach is new from the recent CT Abdomen and Pelvis. Recommend NG tube decompression. Electronically Signed   By: Genevie Ann M.D.   On: 02/10/2020 13:32        Scheduled Meds: . amitriptyline  25 mg Oral QHS  . atorvastatin  10 mg Oral Daily  . calcium carbonate  1 tablet Oral TID WC  . insulin aspart  0-15 Units Subcutaneous TID WC  . insulin aspart  0-5 Units Subcutaneous QHS  . metoprolol tartrate  2.5 mg Intravenous Q6H  . multivitamin with minerals  1 tablet Oral Daily  . olopatadine  1 drop Both Eyes Daily  . pantoprazole  40 mg Oral Daily  . potassium chloride  10 mEq Oral Daily  . pregabalin  300 mg Oral BID  . vitamin B-12  1,000 mcg Oral Daily   Continuous Infusions: . sodium chloride 100 mL/hr at 02/11/20 0433  . ceFEPime (MAXIPIME) IV 2 g (02/11/20 1245)  . metronidazole 500 mg (02/11/20 0845)     LOS: 3 days    Time spent: 30 minutes    Barb Merino, MD [facility] Pager [phone]

## 2020-02-12 ENCOUNTER — Other Ambulatory Visit: Payer: Self-pay

## 2020-02-12 ENCOUNTER — Inpatient Hospital Stay (HOSPITAL_COMMUNITY): Payer: Medicare Other

## 2020-02-12 LAB — CBC WITH DIFFERENTIAL/PLATELET
Abs Immature Granulocytes: 0.07 10*3/uL (ref 0.00–0.07)
Basophils Absolute: 0 10*3/uL (ref 0.0–0.1)
Basophils Relative: 0 %
Eosinophils Absolute: 0 10*3/uL (ref 0.0–0.5)
Eosinophils Relative: 1 %
HCT: 34.5 % — ABNORMAL LOW (ref 39.0–52.0)
Hemoglobin: 11.6 g/dL — ABNORMAL LOW (ref 13.0–17.0)
Immature Granulocytes: 1 %
Lymphocytes Relative: 12 %
Lymphs Abs: 0.8 10*3/uL (ref 0.7–4.0)
MCH: 31.4 pg (ref 26.0–34.0)
MCHC: 33.6 g/dL (ref 30.0–36.0)
MCV: 93.5 fL (ref 80.0–100.0)
Monocytes Absolute: 1.1 10*3/uL — ABNORMAL HIGH (ref 0.1–1.0)
Monocytes Relative: 16 %
Neutro Abs: 4.6 10*3/uL (ref 1.7–7.7)
Neutrophils Relative %: 70 %
Platelets: 168 10*3/uL (ref 150–400)
RBC: 3.69 MIL/uL — ABNORMAL LOW (ref 4.22–5.81)
RDW: 13.7 % (ref 11.5–15.5)
WBC: 6.6 10*3/uL (ref 4.0–10.5)
nRBC: 0 % (ref 0.0–0.2)

## 2020-02-12 LAB — GLUCOSE, CAPILLARY
Glucose-Capillary: 89 mg/dL (ref 70–99)
Glucose-Capillary: 76 mg/dL (ref 70–99)
Glucose-Capillary: 78 mg/dL (ref 70–99)
Glucose-Capillary: 98 mg/dL (ref 70–99)

## 2020-02-12 LAB — BASIC METABOLIC PANEL
Anion gap: 10 (ref 5–15)
BUN: 13 mg/dL (ref 8–23)
CO2: 20 mmol/L — ABNORMAL LOW (ref 22–32)
Calcium: 8.3 mg/dL — ABNORMAL LOW (ref 8.9–10.3)
Chloride: 110 mmol/L (ref 98–111)
Creatinine, Ser: 0.95 mg/dL (ref 0.61–1.24)
GFR calc Af Amer: >60 mL/min (ref >60)
GFR calc non Af Amer: >60 mL/min (ref >60)
Glucose, Bld: 106 mg/dL — ABNORMAL HIGH (ref 70–99)
Potassium: 3.5 mmol/L (ref 3.5–5.1)
Sodium: 140 mmol/L (ref 135–145)

## 2020-02-12 LAB — MAGNESIUM: Magnesium: 1.7 mg/dL (ref 1.7–2.4)

## 2020-02-12 LAB — PHOSPHORUS: Phosphorus: 2.1 mg/dL — ABNORMAL LOW (ref 2.5–4.6)

## 2020-02-12 MED ORDER — IOHEXOL 300 MG/ML  SOLN
100.0000 mL | Freq: Once | INTRAMUSCULAR | Status: AC | PRN
  Administered 2020-02-12: 100 mL via INTRAVENOUS

## 2020-02-12 MED ORDER — MAGNESIUM SULFATE 2 GM/50ML IV SOLN
2.0000 g | Freq: Once | INTRAVENOUS | Status: AC
  Administered 2020-02-12: 2 g via INTRAVENOUS
  Filled 2020-02-12: qty 50

## 2020-02-12 MED ORDER — IOHEXOL 9 MG/ML PO SOLN
500.0000 mL | ORAL | Status: AC
  Administered 2020-02-12 (×2): 500 mL via ORAL

## 2020-02-12 NOTE — Progress Notes (Signed)
PROGRESS NOTE    [name]  TKW:[id number] DOB: [birth year] DOA: 02/08/2020 PCP: [name], MD    Brief Narrative:  79 year old gentleman with history of orthostatic hypotension, AAA repair, GERD, coronary artery disease, hyperlipidemia, hypertension, ischemic cardiomyopathy and diabetes, paroxysmal A. fib on Xarelto presented to the emergency room with dizziness and near passing out spells.  Patient felt weak and dizzy.  When EMS arrived his systolic blood pressures were 60s.  Patient also started having explosive diarrhea.  In the emergency room, afebrile.  Blood pressures initially 61/46 with heart rate of 86 that responded well to fluid resuscitation.  Oxygen 87% on room air.  Lactic acid 2.9.  Rest of the chemistry and CBC were unremarkable.  Chest x-ray showed CHF with cardiomegaly mostly chronic.  CT scan of the abdomen pelvis showed focally thickened segment of the distal descending colon and proximal sigmoid colon with surrounding pericolonic fatty stranding.  Admitted with fluid and antibiotics.  4/15, diarrhea improved but patient developed abdominal distention and ileus.  A KUB x-ray showed huge stomach with no intestinal obstruction.  NG tube was placed, 2 L gastric secretions out with clinical improvement.  4/17, no BM yet, NG tube clamped for 24 hours with no distension.   Assessment & Plan:   Principal Problem:   Acute colitis Active Problems:   Hypertension   Sleep apnea   DM (diabetes mellitus), type 2, uncontrolled w/neurologic complication (HCC)   Paroxysmal atrial fibrillation with RVR (HCC)   Coronary artery disease involving native coronary artery of native heart with angina pectoris (HCC)   Chronic combined systolic and diastolic heart failure ([place])   Long term current use of anticoagulant therapy   Hyperlipidemia   AKI (acute kidney injury) ([place])   Near syncope   Hypotension  Sepsis present on admission due to acute colitis: Infective versus ischemic  colitis. Sepsis is resolving. Diarrhea improved. Blood pressures responded adequate, lactate normalized.  He started tolerating liquid diet.  C. difficile and GI pathogen panel negative. Unfortunately, 4/15 evening started having distention. We will continue IV antibiotics until repeat CT scan.  Ileus/gastric distention: Patient developed significant ileus with gastric distention.  NG tube, more than 2 L drained.  Symptomatically improving.  No bowel movements yet. NG tube clamped last 24 hours.  Tolerating sips with meds.   We will do a CT scan with contrast today.  If normal findings, will start patient on liquid diet.  Abnormal chest x-ray/abnormal CT scan of the chest: Unknown whether acute or chronic findings.  Patient does not have any pulmonary symptoms.  We will continue 7 days of antibiotics.  Syncope/vasovagal: Suspect due to dehydration with orthostasis.  Patient does have history of chronic orthostatic changes.  Discontinue ACE inhibitor's.  Continue IV and oral hydration.  Will monitor.  Normal orthostatic.  Hypertension: Hypotensive on arrival.  Keeping off antihypertensives.  Blood pressure stable.  Paroxysmal A. fib: Rate controlled.  In sinus rhythm now.  Holding Xarelto in case patient needs any procedures.  Resume once no more procedures planned.  Coronary artery disease with history of ischemic cardiomyopathy: Currently stable.  Acute kidney injury: Continues to respond.  Creatinine normalized.  Hypophosphatemia: Replaced with improvement.  We will replace further.  Mobilize.  Can come off the telemetry monitor.  DVT prophylaxis: Xarelto, on hold Code Status: Full code Family Communication: Wife at the bedside. Disposition Plan: patient is from home. Anticipated DC to home, Barriers to discharge, post diarrhea ileus.    Consultants:  None  Procedures:   None  Antimicrobials:  Antibiotics Given (last 72 hours)    Date/Time Action Medication Dose Rate    02/09/20 1323 New Bag/Given   ceFEPIme (MAXIPIME) 2 g in sodium chloride 0.9 % 100 mL IVPB 2 g 200 mL/hr   02/09/20 1532 New Bag/Given   metroNIDAZOLE (FLAGYL) IVPB 500 mg 500 mg 100 mL/hr   02/09/20 2150 New Bag/Given   ceFEPIme (MAXIPIME) 2 g in sodium chloride 0.9 % 100 mL IVPB 2 g 200 mL/hr   02/10/20 0228 New Bag/Given   metroNIDAZOLE (FLAGYL) IVPB 500 mg 500 mg 100 mL/hr   02/10/20 0750 New Bag/Given   metroNIDAZOLE (FLAGYL) IVPB 500 mg 500 mg 100 mL/hr   02/10/20 1146 New Bag/Given   ceFEPIme (MAXIPIME) 2 g in sodium chloride 0.9 % 100 mL IVPB 2 g 200 mL/hr   02/10/20 1515 New Bag/Given   metroNIDAZOLE (FLAGYL) IVPB 500 mg 500 mg 100 mL/hr   02/10/20 2244 New Bag/Given   ceFEPIme (MAXIPIME) 2 g in sodium chloride 0.9 % 100 mL IVPB 2 g 200 mL/hr   02/11/20 0016 New Bag/Given   metroNIDAZOLE (FLAGYL) IVPB 500 mg 500 mg 100 mL/hr   02/11/20 0845 New Bag/Given   metroNIDAZOLE (FLAGYL) IVPB 500 mg 500 mg 100 mL/hr   02/11/20 1245 New Bag/Given   ceFEPIme (MAXIPIME) 2 g in sodium chloride 0.9 % 100 mL IVPB 2 g 200 mL/hr   02/11/20 1656 New Bag/Given   metroNIDAZOLE (FLAGYL) IVPB 500 mg 500 mg 100 mL/hr   02/11/20 2110 New Bag/Given   ceFEPIme (MAXIPIME) 2 g in sodium chloride 0.9 % 100 mL IVPB 2 g 200 mL/hr   02/12/20 0037 New Bag/Given   metroNIDAZOLE (FLAGYL) IVPB 500 mg 500 mg 100 mL/hr   02/12/20 1007 New Bag/Given   metroNIDAZOLE (FLAGYL) IVPB 500 mg 500 mg 100 mL/hr   02/12/20 1012 New Bag/Given   ceFEPIme (MAXIPIME) 2 g in sodium chloride 0.9 % 100 mL IVPB 2 g 200 mL/hr         Subjective: Seen and examined.  No overnight events.  NG clamped for last 24 hours.  Does not have any distention or vomiting.  No gas or bowel movements.  Feet hurts as usual with neuropathy.  Objective: Vitals:   02/11/20 2142 02/11/20 2145 02/11/20 2149 02/12/20 0610  BP: 134/76 (!) 145/76 (!) 146/80 131/71  Pulse: 73 85 85 73  Resp:    18  Temp:    97.9 F (36.6 C)  TempSrc:    Oral   SpO2: 98% 99%  95%    Intake/Output Summary (Last 24 hours) at 02/12/2020 1213 Last data filed at 02/12/2020 0900 Gross per 24 hour  Intake 1361.48 ml  Output 125 ml  Net 1236.48 ml   There were no vitals filed for this visit.  Examination:  General exam: Appears comfortable today.  Hydrated with moist mucous membranes. Respiratory system: Clear to auscultation. Respiratory effort normal.  No added sounds. Cardiovascular system: S1 & S2 heard, RRR. No JVD, murmurs, rubs, gallops or clicks.  Shiny lower extremities with trace edema on the dorsum of the foot. Gastrointestinal system: Abdomen is nondistended.  Nontender.  No localized tenderness or rigidity. Bowel sounds present today. Central nervous system: Alert and oriented. No focal neurological deficits. Extremities: Symmetric 5 x 5 power. Skin: No rashes, lesions or ulcers Psychiatry: Judgement and insight appear normal. Mood & affect appropriate.     Data Reviewed: I have personally reviewed following labs  and imaging studies  CBC: Recent Labs  Lab 02/08/20 1621 02/09/20 0414 02/10/20 0719 02/11/20 0244 02/12/20 0603  WBC 9.0 17.5* 12.8* 9.3 6.6  NEUTROABS  --   --  10.7* 6.9 4.6  HGB 14.4 14.0 12.2* 11.9* 11.6*  HCT 42.6 42.7 36.2* 36.2* 34.5*  MCV 94.0 95.3 93.5 94.5 93.5  PLT 249 180 159 175 308   Basic Metabolic Panel: Recent Labs  Lab 02/08/20 1621 02/09/20 0414 02/10/20 0719 02/11/20 0244 02/12/20 0603  NA 140 139 138 140 140  K 4.5 4.4 4.0 3.7 3.5  CL 103 103 103 110 110  CO2 24 23 22  20* 20*  GLUCOSE 268* 144* 161* 107* 106*  BUN 34* 27* 22 20 13   CREATININE 1.77* 1.27* 1.14 1.04 0.95  CALCIUM 8.9 8.9 8.5* 8.5* 8.3*  MG  --   --  1.9 1.8 1.7  PHOS  --   --  3.6 1.8* 2.1*   GFR: CrCl cannot be calculated (Unknown ideal weight.). Liver Function Tests: Recent Labs  Lab 02/08/20 1621 02/09/20 0414 02/10/20 0719 02/11/20 0244  AST 23 19 18 20   ALT 22 21 21 23   ALKPHOS 72 59 50 49   BILITOT 0.9 0.6 1.0 0.9  PROT 6.6 6.6 6.0* 5.9*  ALBUMIN 2.8* 2.8* 2.4* 2.4*   No results for input(s): LIPASE, AMYLASE in the last 168 hours. No results for input(s): AMMONIA in the last 168 hours. Coagulation Profile: No results for input(s): INR, PROTIME in the last 168 hours. Cardiac Enzymes: No results for input(s): CKTOTAL, CKMB, CKMBINDEX, TROPONINI in the last 168 hours. BNP (last 3 results) No results for input(s): PROBNP in the last 8760 hours. HbA1C: No results for input(s): HGBA1C in the last 72 hours. CBG: Recent Labs  Lab 02/11/20 1222 02/11/20 1716 02/11/20 2115 02/12/20 0746 02/12/20 1209  GLUCAP 138* 112* 90 89 76   Lipid Profile: No results for input(s): CHOL, HDL, LDLCALC, TRIG, CHOLHDL, LDLDIRECT in the last 72 hours. Thyroid Function Tests: No results for input(s): TSH, T4TOTAL, FREET4, T3FREE, THYROIDAB in the last 72 hours. Anemia Panel: No results for input(s): VITAMINB12, FOLATE, FERRITIN, TIBC, IRON, RETICCTPCT in the last 72 hours. Sepsis Labs: Recent Labs  Lab 02/08/20 1621 02/08/20 1930 02/08/20 2300  LATICACIDVEN 2.9* 2.4* 1.5    Recent Results (from the past 240 hour(s))  Respiratory Panel by RT PCR (Flu A&B, Covid) - Nasopharyngeal Swab     Status: None   Collection Time: 02/08/20  4:52 PM   Specimen: Nasopharyngeal Swab  Result Value Ref Range Status   SARS Coronavirus 2 by RT PCR NEGATIVE NEGATIVE Final    Comment: (NOTE) SARS-CoV-2 target nucleic acids are NOT DETECTED. The SARS-CoV-2 RNA is generally detectable in upper respiratoy specimens during the acute phase of infection. The lowest concentration of SARS-CoV-2 viral copies this assay can detect is 131 copies/mL. A negative result does not preclude SARS-Cov-2 infection and should not be used as the sole basis for treatment or other patient management decisions. A negative result may occur with  improper specimen collection/handling, submission of specimen other than  nasopharyngeal swab, presence of viral mutation(s) within the areas targeted by this assay, and inadequate number of viral copies (<131 copies/mL). A negative result must be combined with clinical observations, patient history, and epidemiological information. The expected result is Negative. Fact Sheet for Patients:  PinkCheek.be Fact Sheet for Healthcare Providers:  GravelBags.it This test is not yet ap proved or cleared by the [place] and  has been authorized for detection and/or diagnosis of SARS-CoV-2 by FDA under an Emergency Use Authorization (EUA). This EUA will remain  in effect (meaning this test can be used) for the duration of the COVID-19 declaration under Section 564(b)(1) of the Act, 21 U.S.C. section 360bbb-3(b)(1), unless the authorization is terminated or revoked sooner.    Influenza A by PCR NEGATIVE NEGATIVE Final   Influenza B by PCR NEGATIVE NEGATIVE Final    Comment: (NOTE) The Xpert Xpress SARS-CoV-2/FLU/RSV assay is intended as an aid in  the diagnosis of influenza from Nasopharyngeal swab specimens and  should not be used as a sole basis for treatment. Nasal washings and  aspirates are unacceptable for Xpert Xpress SARS-CoV-2/FLU/RSV  testing. Fact Sheet for Patients: PinkCheek.be Fact Sheet for Healthcare Providers: GravelBags.it This test is not yet approved or cleared by the [place] FDA and  has been authorized for detection and/or diagnosis of SARS-CoV-2 by  FDA under an Emergency Use Authorization (EUA). This EUA will remain  in effect (meaning this test can be used) for the duration of the  Covid-19 declaration under Section 564(b)(1) of the Act, 21  U.S.C. section 360bbb-3(b)(1), unless the authorization is  terminated or revoked. Performed at [facility], [place] [address], [place], [place] [address]   Blood  Culture (routine x 2)     Status: None (Preliminary result)   Collection Time: 02/08/20 10:48 PM   Specimen: BLOOD  Result Value Ref Range Status   Specimen Description BLOOD RIGHT ARM  Final   Special Requests   Final    BOTTLES DRAWN AEROBIC AND ANAEROBIC Blood Culture adequate volume   Culture   Final    NO GROWTH 3 DAYS Performed at [facility], [place] [address], [place], [place] [address]    Report Status PENDING  Incomplete  Blood Culture (routine x 2)     Status: None (Preliminary result)   Collection Time: 02/08/20 11:08 PM   Specimen: BLOOD  Result Value Ref Range Status   Specimen Description BLOOD RIGHT FOREARM  Final   Special Requests   Final    BOTTLES DRAWN AEROBIC AND ANAEROBIC Blood Culture adequate volume   Culture   Final    NO GROWTH 3 DAYS Performed at [facility], [place] [address], [place],  [address]    Report Status PENDING  Incomplete  Gastrointestinal Panel by PCR , Stool     Status: None   Collection Time: 02/09/20  4:26 AM   Specimen: Stool  Result Value Ref Range Status   Campylobacter species NOT DETECTED NOT DETECTED Final   Plesimonas shigelloides NOT DETECTED NOT DETECTED Final   Salmonella species NOT DETECTED NOT DETECTED Final   Yersinia enterocolitica NOT DETECTED NOT DETECTED Final   Vibrio species NOT DETECTED NOT DETECTED Final   Vibrio cholerae NOT DETECTED NOT DETECTED Final   Enteroaggregative E coli (EAEC) NOT DETECTED NOT DETECTED Final   Enteropathogenic E coli (EPEC) NOT DETECTED NOT DETECTED Final   Enterotoxigenic E coli (ETEC) NOT DETECTED NOT DETECTED Final   Shiga like toxin producing E coli (STEC) NOT DETECTED NOT DETECTED Final   Shigella/Enteroinvasive E coli (EIEC) NOT DETECTED NOT DETECTED Final   Cryptosporidium NOT DETECTED NOT DETECTED Final   Cyclospora cayetanensis NOT DETECTED NOT DETECTED Final   Entamoeba histolytica NOT DETECTED NOT DETECTED Final   Giardia lamblia NOT DETECTED NOT DETECTED  Final   Adenovirus F40/41 NOT DETECTED NOT DETECTED Final   Astrovirus  NOT DETECTED NOT DETECTED Final   Norovirus GI/GII NOT DETECTED NOT DETECTED Final   Rotavirus A NOT DETECTED NOT DETECTED Final   Sapovirus (I, II, IV, and V) NOT DETECTED NOT DETECTED Final    Comment: Performed at [facility], [place], [place] [address]  C Difficile Quick Screen w PCR reflex     Status: None   Collection Time: 02/09/20  4:27 AM   Specimen: Stool  Result Value Ref Range Status   C Diff antigen NEGATIVE NEGATIVE Final   C Diff toxin NEGATIVE NEGATIVE Final   C Diff interpretation No C. difficile detected.  Final    Comment: Performed at [facility], [place] [address], [place], [place] [address]         Radiology Studies: DG Abd 1 View  Result Date: 02/10/2020 CLINICAL DATA:  79 year old male with abdominal distension. EXAM: ABDOMEN - 1 VIEW COMPARISON:  CT Abdomen and Pelvis 03/09/2020. FINDINGS: Supine views of the abdomen. New severe gaseous distension of the stomach from the CT 2 days ago. There is superimposed gas and other nondilated bowel loops in the abdomen and pelvis. Stable cholecystectomy clips. No acute osseous abnormality identified. Previous lower lumbar decompression and fusion. Vascular calcifications in the abdomen and pelvis. IMPRESSION: Severe gaseous distension of the stomach is new from the recent CT Abdomen and Pelvis. Recommend NG tube decompression. Electronically Signed   By: Genevie Ann M.D.   On: 02/10/2020 13:32        Scheduled Meds: . amitriptyline  25 mg Oral QHS  . atorvastatin  10 mg Oral Daily  . calcium carbonate  1 tablet Oral TID WC  . insulin aspart  0-15 Units Subcutaneous TID WC  . insulin aspart  0-5 Units Subcutaneous QHS  . metoprolol tartrate  2.5 mg Intravenous Q6H  . multivitamin with minerals  1 tablet Oral Daily  . olopatadine  1 drop Both Eyes Daily  . pantoprazole  40 mg Oral Daily  . potassium chloride  10 mEq  Oral Daily  . pregabalin  300 mg Oral BID  . vitamin B-12  1,000 mcg Oral Daily   Continuous Infusions: . sodium chloride 100 mL/hr at 02/11/20 2100  . ceFEPime (MAXIPIME) IV 2 g (02/12/20 1012)  . magnesium sulfate bolus IVPB    . metronidazole 500 mg (02/12/20 1007)     LOS: 4 days    Time spent: 30 minutes    Barb Merino, MD [facility] Pager [phone]

## 2020-02-13 ENCOUNTER — Inpatient Hospital Stay (HOSPITAL_COMMUNITY): Payer: Medicare Other

## 2020-02-13 DIAGNOSIS — I480 Paroxysmal atrial fibrillation: Secondary | ICD-10-CM

## 2020-02-13 DIAGNOSIS — N179 Acute kidney failure, unspecified: Secondary | ICD-10-CM

## 2020-02-13 DIAGNOSIS — I5042 Chronic combined systolic (congestive) and diastolic (congestive) heart failure: Secondary | ICD-10-CM

## 2020-02-13 DIAGNOSIS — K567 Ileus, unspecified: Secondary | ICD-10-CM | POA: Diagnosis not present

## 2020-02-13 LAB — CBC WITH DIFFERENTIAL/PLATELET
Abs Immature Granulocytes: 0.12 10*3/uL — ABNORMAL HIGH (ref 0.00–0.07)
Basophils Absolute: 0 10*3/uL (ref 0.0–0.1)
Basophils Relative: 0 %
Eosinophils Absolute: 0 10*3/uL (ref 0.0–0.5)
Eosinophils Relative: 0 %
HCT: 35.2 % — ABNORMAL LOW (ref 39.0–52.0)
Hemoglobin: 11.8 g/dL — ABNORMAL LOW (ref 13.0–17.0)
Immature Granulocytes: 2 %
Lymphocytes Relative: 15 %
Lymphs Abs: 0.8 10*3/uL (ref 0.7–4.0)
MCH: 30.9 pg (ref 26.0–34.0)
MCHC: 33.5 g/dL (ref 30.0–36.0)
MCV: 92.1 fL (ref 80.0–100.0)
Monocytes Absolute: 1 10*3/uL (ref 0.1–1.0)
Monocytes Relative: 17 %
Neutro Abs: 3.6 10*3/uL (ref 1.7–7.7)
Neutrophils Relative %: 66 %
Platelets: 180 10*3/uL (ref 150–400)
RBC: 3.82 MIL/uL — ABNORMAL LOW (ref 4.22–5.81)
RDW: 13.6 % (ref 11.5–15.5)
WBC: 5.5 10*3/uL (ref 4.0–10.5)
nRBC: 0 % (ref 0.0–0.2)

## 2020-02-13 LAB — BASIC METABOLIC PANEL
Anion gap: 12 (ref 5–15)
BUN: 9 mg/dL (ref 8–23)
CO2: 16 mmol/L — ABNORMAL LOW (ref 22–32)
Calcium: 8.1 mg/dL — ABNORMAL LOW (ref 8.9–10.3)
Chloride: 110 mmol/L (ref 98–111)
Creatinine, Ser: 0.96 mg/dL (ref 0.61–1.24)
GFR calc Af Amer: >60 mL/min (ref >60)
GFR calc non Af Amer: >60 mL/min (ref >60)
Glucose, Bld: 80 mg/dL (ref 70–99)
Potassium: 3.4 mmol/L — ABNORMAL LOW (ref 3.5–5.1)
Sodium: 138 mmol/L (ref 135–145)

## 2020-02-13 LAB — GLUCOSE, CAPILLARY
Glucose-Capillary: 81 mg/dL (ref 70–99)
Glucose-Capillary: 83 mg/dL (ref 70–99)
Glucose-Capillary: 76 mg/dL (ref 70–99)
Glucose-Capillary: 89 mg/dL (ref 70–99)

## 2020-02-13 LAB — BRAIN NATRIURETIC PEPTIDE: B Natriuretic Peptide: 510.6 pg/mL — ABNORMAL HIGH (ref 0.0–100.0)

## 2020-02-13 MED ORDER — FUROSEMIDE 10 MG/ML IJ SOLN
20.0000 mg | Freq: Two times a day (BID) | INTRAMUSCULAR | Status: DC
  Administered 2020-02-13 – 2020-02-14 (×2): 20 mg via INTRAVENOUS
  Filled 2020-02-13 (×2): qty 2

## 2020-02-13 NOTE — Progress Notes (Addendum)
Pharmacy Antibiotic Note  [name] is a 79 y.o. male admitted on 02/08/2020 with Intra-abdominal infection.  Pharmacy has been consulted for cefepime dosing. Patient is on flagyl per MD.  Renal function seems to be at baseline. Patient was put on increased dose of cefepime, accurate. WBC WNL, afebrile, no growth on any cultures. Plans are to continue IV antibiotics until repeat CT scan. Infective vs ischemic colitis investigation... Consider de-escalation or stop of antibiotics if no infection is suspected any further.  No antibiotic stop-dates have been placed.    Plan: Continue cefepime to 2g IV every 12 hours Continue metronidazole per MD  Monitor renal function, cultures, clinical progression and LOT F/u GI workup and ability to narrow   Temp (24hrs), Avg:97.7 F (36.5 C), Min:97.3 F (36.3 C), Max:98.2 F (36.8 C)  Recent Labs  Lab 02/08/20 1621 02/08/20 1621 02/08/20 1930 02/08/20 2300 02/09/20 0414 02/10/20 0719 02/11/20 0244 02/12/20 0603 02/13/20 0406  WBC 9.0   < >  --   --  17.5* 12.8* 9.3 6.6 5.5  CREATININE 1.77*   < >  --   --  1.27* 1.14 1.04 0.95 0.96  LATICACIDVEN 2.9*  --  2.4* 1.5  --   --   --   --   --    < > = values in this interval not displayed.    Antimicrobials  Cefepime 4/13>> MTZ 4/13>>   Microbiology 4/14 HOZ:YYQM 4/14 Stool panel: neg 4/14 Cdiff neg 4/13 Flu/covid neg    Thank you for the interesting consult and for involving pharmacy in this patient's care.  Tamela Gammon, PharmD, BCPS 02/13/2020 8:52 AM PGY-2 Pharmacy Administration Resident Please check [email or web address] for unit-specific pharmacist phone numbers

## 2020-02-13 NOTE — Progress Notes (Signed)
Pt has CPAP from home but did not bring a mask. Large FFM provided per home use and pt comfort. Pt can place on/off when ready, I told him to call RT for any further assistance.

## 2020-02-13 NOTE — Progress Notes (Addendum)
PROGRESS NOTE  [name] UEA:[id number] DOB: 09-06-1941 DOA: 02/08/2020 PCP: [name], MD  HPI/Recap of past 30 hours: 79 year old gentleman with history of orthostatic hypotension, AAA repair, GERD, coronary artery disease, hyperlipidemia, hypertension, ischemic cardiomyopathy and diabetes, paroxysmal A. fib on Xarelto presented to the emergency room on 4/13 with dizziness and near passing out spells.  Patient felt weak and dizzy.  When EMS arrived his systolic blood pressures were 60s.  Patient also started having explosive diarrhea.  In the emergency room, afebrile.  Blood pressures initially 61/46 with heart rate of 86 that responded well to fluid resuscitation.  Oxygen 87% on room air.  Lactic acid 2.9.  Rest of the chemistry and CBC were unremarkable.  Chest x-ray showed CHF with cardiomegaly mostly chronic.  CT scan of the abdomen pelvis showed focally thickened segment of the distal descending colon and proximal sigmoid colon with surrounding pericolonic fatty stranding.  Admitted with fluid and antibiotics.  4/15, diarrhea improved but patient developed abdominal distention and ileus.  A KUB x-ray showed huge stomach with no intestinal obstruction.  NG tube was placed, 2 L gastric secretions out with clinical improvement.  By 4/16, patient had not had a BM, but was feeling better and his NG tube was clamped.  Over the next 2 days, he has been tolerating this well and started to have some very mild bowel sounds.  Tolerating without NG tube.  No bowel movement yet. .   Assessment/Plan: Principal Problem:   Ileus ([place]) Active Problems:   Hypertension   Sleep apnea   DM (diabetes mellitus), type 2, uncontrolled w/neurologic complication (HCC)   Paroxysmal atrial fibrillation with RVR (HCC)   Coronary artery disease involving native coronary artery of native heart with angina pectoris (HCC)   Chronic combined systolic and diastolic heart failure ([place])   Long term current use of  anticoagulant therapy   Hyperlipidemia   AKI (acute kidney injury) ([place])   Near syncope   Hypotension   Acute colitis Acute respiratory failure with hypoxia  Sepsis present on admission due to acute colitis: Infective versus ischemic colitis. Sepsis is resolving. Diarrhea improved. Blood pressures responded adequate, lactate normalized. C. difficile and GI pathogen panel negative. Unfortunately, 4/15 evening started having distention.  Now with ileus.  Has completed antibiotics.  Ileus/gastric distention: Patient developed significant ileus with gastric distention.  NG tube, more than 2 L drained.  Symptomatically improving.  No bowel movements yet. NG tube now for 48 hours.  Tolerating sips with meds.   CT scan abdomen pelvis noted slightly decreased stomach.  Clinical exam today notes very early bowel sounds.  Abdominal x-ray notes resolution of ileus.  Have started clear liquids.  Acute on chronic systolic/diastolic heart failure.  Echocardiogram done July 2019 noted decreased ejection fraction of 40 to 45% and grade 1 diastolic dysfunction.  BNP elevated at 510.  Have stopped IV fluids and started IV Lasix  Syncope/vasovagal: Suspect due to dehydration with orthostasis.  Patient does have history of chronic orthostatic changes.  Discontinue ACE inhibitor's.  Continue IV and oral hydration.  Will monitor.  Normal orthostatic.  Hypertension: Hypotensive on arrival.  Keeping off antihypertensives.  Blood pressure stable.  Paroxysmal A. fib: Rate controlled.  In sinus rhythm now.  Holding Xarelto in case patient needs any procedures.  Resume once no more procedures planned.  Coronary artery disease with history of ischemic cardiomyopathy: Currently stable.  Acute kidney injury: Initial creatinine on admission at 1.14.  Resolved with IV fluids.  Hypophosphatemia: Replaced with improvement.    Replace as needed.  Obstructive sleep apnea: Uses CPAP at home and brought his machine  here, but his wife could not find the mask.  Have consulted respiratory therapy  Code Status: Full code  Family Communication: Wife at the bedside  Disposition Plan: Potential discharge in the next 1 to 2 days once fully diuresed and diet advanced and tolerating p.o.   Consultants:  None  Procedures:  None  Antimicrobials:  IV cefepime and Flagyl 4/14-4/18  DVT prophylaxis: SCDs while Xarelto on hold   Objective: Vitals:   02/13/20 0602 02/13/20 1257  BP: 118/61 122/65  Pulse: 72 77  Resp:  18  Temp: (!) 97.3 F (36.3 C) 97.9 F (36.6 C)  SpO2: 95% 96%    Intake/Output Summary (Last 24 hours) at 02/13/2020 1346 Last data filed at 02/13/2020 1259 Gross per 24 hour  Intake 0 ml  Output 4300 ml  Net -4300 ml   There were no vitals filed for this visit. There is no height or weight on file to calculate BMI.  Exam:   General: Alert and oriented x3, no acute distress  HEENT: [Atraumatic, mucous members are moist  Neck: Thick, narrow airway  Cardiovascular: Regular rate and rhythm, S1-S2  Respiratory: Clear to auscultation bilaterally  Abdomen: Soft, distended, scant bowel sounds, nontender  Musculoskeletal: No clubbing or cyanosis or edema  Neuro: No focal deficits  Skin: No skin breaks, tears or lesions.  Psychiatry: Appropriate, no evidence of psychoses   Data Reviewed: CBC: Recent Labs  Lab 02/09/20 0414 02/10/20 0719 02/11/20 0244 02/12/20 0603 02/13/20 0406  WBC 17.5* 12.8* 9.3 6.6 5.5  NEUTROABS  --  10.7* 6.9 4.6 3.6  HGB 14.0 12.2* 11.9* 11.6* 11.8*  HCT 42.7 36.2* 36.2* 34.5* 35.2*  MCV 95.3 93.5 94.5 93.5 92.1  PLT 180 159 175 168 527   Basic Metabolic Panel: Recent Labs  Lab 02/09/20 0414 02/10/20 0719 02/11/20 0244 02/12/20 0603 02/13/20 0406  NA 139 138 140 140 138  K 4.4 4.0 3.7 3.5 3.4*  CL 103 103 110 110 110  CO2 23 22 20* 20* 16*  GLUCOSE 144* 161* 107* 106* 80  BUN 27* 22 20 13 9   CREATININE 1.27* 1.14  1.04 0.95 0.96  CALCIUM 8.9 8.5* 8.5* 8.3* 8.1*  MG  --  1.9 1.8 1.7  --   PHOS  --  3.6 1.8* 2.1*  --    GFR: CrCl cannot be calculated (Unknown ideal weight.). Liver Function Tests: Recent Labs  Lab 02/08/20 1621 02/09/20 0414 02/10/20 0719 02/11/20 0244  AST 23 19 18 20   ALT 22 21 21 23   ALKPHOS 72 59 50 49  BILITOT 0.9 0.6 1.0 0.9  PROT 6.6 6.6 6.0* 5.9*  ALBUMIN 2.8* 2.8* 2.4* 2.4*   No results for input(s): LIPASE, AMYLASE in the last 168 hours. No results for input(s): AMMONIA in the last 168 hours. Coagulation Profile: No results for input(s): INR, PROTIME in the last 168 hours. Cardiac Enzymes: No results for input(s): CKTOTAL, CKMB, CKMBINDEX, TROPONINI in the last 168 hours. BNP (last 3 results) No results for input(s): PROBNP in the last 8760 hours. HbA1C: No results for input(s): HGBA1C in the last 72 hours. CBG: Recent Labs  Lab 02/12/20 1209 02/12/20 1702 02/12/20 2108 02/13/20 0738 02/13/20 1255  GLUCAP 76 78 98 81 83   Lipid Profile: No results for input(s): CHOL, HDL, LDLCALC, TRIG, CHOLHDL, LDLDIRECT in the last 72 hours. Thyroid Function  Tests: No results for input(s): TSH, T4TOTAL, FREET4, T3FREE, THYROIDAB in the last 72 hours. Anemia Panel: No results for input(s): VITAMINB12, FOLATE, FERRITIN, TIBC, IRON, RETICCTPCT in the last 72 hours. Urine analysis:    Component Value Date/Time   COLORURINE YELLOW 02/08/2020 1624   APPEARANCEUR CLEAR 02/08/2020 1624   LABSPEC 1.011 02/08/2020 1624   PHURINE 6.0 02/08/2020 1624   GLUCOSEU >=500 (A) 02/08/2020 1624   HGBUR LARGE (A) 02/08/2020 1624   BILIRUBINUR NEGATIVE 02/08/2020 1624   KETONESUR NEGATIVE 02/08/2020 1624   PROTEINUR NEGATIVE 02/08/2020 1624   UROBILINOGEN 0.2 12/12/2014 0150   NITRITE NEGATIVE 02/08/2020 1624   LEUKOCYTESUR NEGATIVE 02/08/2020 1624   Sepsis Labs: @LABRCNTIP (procalcitonin:4,lacticidven:4)  ) Recent Results (from the past 240 hour(s))  Respiratory Panel by  RT PCR (Flu A&B, Covid) - Nasopharyngeal Swab     Status: None   Collection Time: 02/08/20  4:52 PM   Specimen: Nasopharyngeal Swab  Result Value Ref Range Status   SARS Coronavirus 2 by RT PCR NEGATIVE NEGATIVE Final    Comment: (NOTE) SARS-CoV-2 target nucleic acids are NOT DETECTED. The SARS-CoV-2 RNA is generally detectable in upper respiratoy specimens during the acute phase of infection. The lowest concentration of SARS-CoV-2 viral copies this assay can detect is 131 copies/mL. A negative result does not preclude SARS-Cov-2 infection and should not be used as the sole basis for treatment or other patient management decisions. A negative result may occur with  improper specimen collection/handling, submission of specimen other than nasopharyngeal swab, presence of viral mutation(s) within the areas targeted by this assay, and inadequate number of viral copies (<131 copies/mL). A negative result must be combined with clinical observations, patient history, and epidemiological information. The expected result is Negative. Fact Sheet for Patients:  PinkCheek.be Fact Sheet for Healthcare Providers:  GravelBags.it This test is not yet ap proved or cleared by the [place] FDA and  has been authorized for detection and/or diagnosis of SARS-CoV-2 by FDA under an Emergency Use Authorization (EUA). This EUA will remain  in effect (meaning this test can be used) for the duration of the COVID-19 declaration under Section 564(b)(1) of the Act, 21 U.S.C. section 360bbb-3(b)(1), unless the authorization is terminated or revoked sooner.    Influenza A by PCR NEGATIVE NEGATIVE Final   Influenza B by PCR NEGATIVE NEGATIVE Final    Comment: (NOTE) The Xpert Xpress SARS-CoV-2/FLU/RSV assay is intended as an aid in  the diagnosis of influenza from Nasopharyngeal swab specimens and  should not be used as a sole basis for treatment.  Nasal washings and  aspirates are unacceptable for Xpert Xpress SARS-CoV-2/FLU/RSV  testing. Fact Sheet for Patients: PinkCheek.be Fact Sheet for Healthcare Providers: GravelBags.it This test is not yet approved or cleared by the [place] FDA and  has been authorized for detection and/or diagnosis of SARS-CoV-2 by  FDA under an Emergency Use Authorization (EUA). This EUA will remain  in effect (meaning this test can be used) for the duration of the  Covid-19 declaration under Section 564(b)(1) of the Act, 21  U.S.C. section 360bbb-3(b)(1), unless the authorization is  terminated or revoked. Performed at [facility], [place] [address] [place], [place] [address]   Blood Culture (routine x 2)     Status: None (Preliminary result)   Collection Time: 02/08/20 10:48 PM   Specimen: BLOOD  Result Value Ref Range Status   Specimen Description BLOOD RIGHT ARM  Final   Special Requests   Final  BOTTLES DRAWN AEROBIC AND ANAEROBIC Blood Culture adequate volume   Culture   Final    NO GROWTH 4 DAYS Performed at [facility], [place] [address] [place], [place] [address]    Report Status PENDING  Incomplete  Blood Culture (routine x 2)     Status: None (Preliminary result)   Collection Time: 02/08/20 11:08 PM   Specimen: BLOOD  Result Value Ref Range Status   Specimen Description BLOOD RIGHT FOREARM  Final   Special Requests   Final    BOTTLES DRAWN AEROBIC AND ANAEROBIC Blood Culture adequate volume   Culture   Final    NO GROWTH 4 DAYS Performed at [facility], [place] [address] [place], [place] [address]    Report Status PENDING  Incomplete  Gastrointestinal Panel by PCR , Stool     Status: None   Collection Time: 02/09/20  4:26 AM   Specimen: Stool  Result Value Ref Range Status   Campylobacter species NOT DETECTED NOT DETECTED Final   Plesimonas shigelloides NOT DETECTED NOT DETECTED Final   Salmonella  species NOT DETECTED NOT DETECTED Final   Yersinia enterocolitica NOT DETECTED NOT DETECTED Final   Vibrio species NOT DETECTED NOT DETECTED Final   Vibrio cholerae NOT DETECTED NOT DETECTED Final   Enteroaggregative E coli (EAEC) NOT DETECTED NOT DETECTED Final   Enteropathogenic E coli (EPEC) NOT DETECTED NOT DETECTED Final   Enterotoxigenic E coli (ETEC) NOT DETECTED NOT DETECTED Final   Shiga like toxin producing E coli (STEC) NOT DETECTED NOT DETECTED Final   Shigella/Enteroinvasive E coli (EIEC) NOT DETECTED NOT DETECTED Final   Cryptosporidium NOT DETECTED NOT DETECTED Final   Cyclospora cayetanensis NOT DETECTED NOT DETECTED Final   Entamoeba histolytica NOT DETECTED NOT DETECTED Final   Giardia lamblia NOT DETECTED NOT DETECTED Final   Adenovirus F40/41 NOT DETECTED NOT DETECTED Final   Astrovirus NOT DETECTED NOT DETECTED Final   Norovirus GI/GII NOT DETECTED NOT DETECTED Final   Rotavirus A NOT DETECTED NOT DETECTED Final   Sapovirus (I, II, IV, and V) NOT DETECTED NOT DETECTED Final    Comment: Performed at [facility], [place] [place], [place] [address]  C Difficile Quick Screen w PCR reflex     Status: None   Collection Time: 02/09/20  4:27 AM   Specimen: Stool  Result Value Ref Range Status   C Diff antigen NEGATIVE NEGATIVE Final   C Diff toxin NEGATIVE NEGATIVE Final   C Diff interpretation No C. difficile detected.  Final    Comment: Performed at [facility], [place] [address], [place],  [address]      Studies: CT ABDOMEN PELVIS W CONTRAST  Result Date: 02/12/2020 CLINICAL DATA:  Abdominal distension. Clinical suspicion for pseudo-obstruction. EXAM: CT ABDOMEN AND PELVIS WITH CONTRAST TECHNIQUE: Multidetector CT imaging of the abdomen and pelvis was performed using the standard protocol following bolus administration of intravenous contrast. CONTRAST:  158mL OMNIPAQUE IOHEXOL 300 MG/ML  SOLN COMPARISON:  Abdomen radiographs dated  02/10/2020. Abdomen and pelvis CT dated 02/08/2019 FINDINGS: Lower chest: Interval small bilateral pleural effusions, larger on the left. Associated left lower lobe compressive atelectasis and minimal right lower lobe compressive atelectasis. Mild cardiomegaly with a mild increase in size of the heart. Interval nasogastric tube with its tip in the distal esophagus. Hepatobiliary: No focal liver abnormality is seen. Status post cholecystectomy. No biliary dilatation. Pancreas: Unremarkable. No pancreatic ductal dilatation or surrounding inflammatory changes. Spleen: Normal  in size without focal abnormality. Adrenals/Urinary Tract: Normal appearing adrenal glands. Right renal cysts. 7 mm calculus in an area of cortical scarring in the lower pole of the left kidney. No bladder or ureteral calculi. The urinary bladder is moderately dilated. Stomach/Bowel: Dilated stomach with mild improvement since 02/10/2020. Unremarkable small bowel, colon and appendix. Vascular/Lymphatic: Atheromatous arterial calcifications without aneurysm. No enlarged lymph nodes. An aorto bi-iliac graft is in place the infrarenal abdominal aorta remains mildly dilated with a maximum AP diameter 3.7 mm, previously 3.9 cm in corresponding diameter. No enlarged lymph nodes. Reproductive: Mildly enlarged prostate gland. Other: No abdominal wall hernia or abnormality. No abdominopelvic ascites. Musculoskeletal: Lumbar and lower thoracic spine degenerative changes. Interbody and pedicle screw and rod fusion at the L3 through L5 levels. IMPRESSION: 1. Dilated stomach with mild improvement since 02/10/2020. 2. Nasogastric tube tip in the distal esophagus. This needs to be advanced into the stomach. 3. Interval small bilateral pleural effusions, larger on the left. 4. Associated left lower lobe compressive atelectasis and minimal right lower lobe compressive atelectasis. 5. 7 mm nonobstructing left renal calculus in an area of cortical scarring.  Electronically Signed   By: Claudie Revering M.D.   On: 02/12/2020 15:37    Scheduled Meds: . amitriptyline  25 mg Oral QHS  . atorvastatin  10 mg Oral Daily  . calcium carbonate  1 tablet Oral TID WC  . insulin aspart  0-15 Units Subcutaneous TID WC  . insulin aspart  0-5 Units Subcutaneous QHS  . metoprolol tartrate  2.5 mg Intravenous Q6H  . multivitamin with minerals  1 tablet Oral Daily  . olopatadine  1 drop Both Eyes Daily  . pantoprazole  40 mg Oral Daily  . potassium chloride  10 mEq Oral Daily  . pregabalin  300 mg Oral BID  . vitamin B-12  1,000 mcg Oral Daily    Continuous Infusions: . sodium chloride 100 mL/hr at 02/11/20 2100  . ceFEPime (MAXIPIME) IV 2 g (02/13/20 1021)  . metronidazole 500 mg (02/13/20 0801)     LOS: 5 days     Annita Brod, MD [facility]   02/13/2020, 1:46 PM

## 2020-02-14 LAB — BASIC METABOLIC PANEL
Anion gap: 14 (ref 5–15)
BUN: 9 mg/dL (ref 8–23)
CO2: 17 mmol/L — ABNORMAL LOW (ref 22–32)
Calcium: 8.6 mg/dL — ABNORMAL LOW (ref 8.9–10.3)
Chloride: 107 mmol/L (ref 98–111)
Creatinine, Ser: 1.04 mg/dL (ref 0.61–1.24)
GFR calc Af Amer: >60 mL/min (ref >60)
GFR calc non Af Amer: >60 mL/min (ref >60)
Glucose, Bld: 151 mg/dL — ABNORMAL HIGH (ref 70–99)
Potassium: 3.4 mmol/L — ABNORMAL LOW (ref 3.5–5.1)
Sodium: 138 mmol/L (ref 135–145)

## 2020-02-14 LAB — GLUCOSE, CAPILLARY
Glucose-Capillary: 136 mg/dL — ABNORMAL HIGH (ref 70–99)
Glucose-Capillary: 228 mg/dL — ABNORMAL HIGH (ref 70–99)
Glucose-Capillary: 227 mg/dL — ABNORMAL HIGH (ref 70–99)
Glucose-Capillary: 175 mg/dL — ABNORMAL HIGH (ref 70–99)

## 2020-02-14 LAB — CULTURE, BLOOD (ROUTINE X 2)
Culture: NO GROWTH
Culture: NO GROWTH
Special Requests: ADEQUATE
Special Requests: ADEQUATE

## 2020-02-14 MED ORDER — POTASSIUM CHLORIDE CRYS ER 20 MEQ PO TBCR
20.0000 meq | EXTENDED_RELEASE_TABLET | Freq: Two times a day (BID) | ORAL | Status: DC
  Administered 2020-02-14 – 2020-02-17 (×7): 20 meq via ORAL
  Filled 2020-02-14 (×7): qty 1

## 2020-02-14 NOTE — Progress Notes (Signed)
PROGRESS NOTE  JORAN KALLAL ATF:[id number] DOB: 07/02/1941 DOA: 02/08/2020 PCP: [name], MD  HPI/Recap of past 64 hours: 79 year old gentleman with history of orthostatic hypotension, AAA repair, GERD, coronary artery disease, hyperlipidemia, hypertension, ischemic cardiomyopathy and diabetes, paroxysmal A. fib on Xarelto presented to the emergency room on 4/13 with dizziness and near passing out spells.  Patient felt weak and dizzy.  When EMS arrived his systolic blood pressures were 60s.  Patient also started having explosive diarrhea.  In the emergency room, afebrile.  Blood pressures initially 61/46 with heart rate of 86 that responded well to fluid resuscitation.  Oxygen 87% on room air.  Lactic acid 2.9.  Rest of the chemistry and CBC were unremarkable.  Chest x-ray showed CHF with cardiomegaly mostly chronic.  CT scan of the abdomen pelvis showed focally thickened segment of the distal descending colon and proximal sigmoid colon with surrounding pericolonic fatty stranding.  Admitted with fluid and antibiotics.  4/15, diarrhea improved but patient developed abdominal distention and ileus.  A KUB x-ray showed huge stomach with no intestinal obstruction.  NG tube was placed, 2 L gastric secretions out with clinical improvement.  By 4/16, patient had not had a BM, but was feeling better and his NG tube was clamped.  Over the next 2 days, he has been tolerating this well and started to have some very mild bowel sounds.  Tolerating without NG tube.  No bowel movement yet.   Assessment/Plan: Principal Problem:   Ileus ([place]) Active Problems:   Hypertension   Sleep apnea   DM (diabetes mellitus), type 2, uncontrolled w/neurologic complication (HCC)   Paroxysmal atrial fibrillation with RVR (HCC)   Coronary artery disease involving native coronary artery of native heart with angina pectoris (HCC)   Chronic combined systolic and diastolic heart failure ([place])   Long term current use of  anticoagulant therapy   Hyperlipidemia   AKI (acute kidney injury) ([place])   Near syncope   Hypotension   Acute colitis Acute respiratory failure with hypoxia  Sepsis present on admission due to acute colitis: Infective versus ischemic colitis. Sepsis is resolving. Diarrhea improved. Blood pressures responded adequate, lactate normalized. C. difficile and GI pathogen panel negative. Unfortunately, 4/15 evening started having distention.  Now with ileus.  Has completed 5 days of antibiotics.  Ileus/gastric distention: Patient developed significant ileus with gastric distention.  NG tube, more than 2 L drained.  Symptomatically improving.  No bowel movements yet. KUB showed improvement of gastric distention.  No evidence of bowel abnormalities. Tolerating liquid diet.  Will advance to full liquid diet.  Advance activities.  Syncope/vasovagal: Suspect due to dehydration with orthostasis.  Patient does have history of chronic orthostatic changes.  Discontinue ACE inhibitor's.  Continue IV and oral hydration.  Will monitor.  Normal orthostatic.  Hypertension: Hypotensive on arrival.  Keeping off antihypertensives.  Blood pressure stable.  Paroxysmal A. fib: Rate controlled.  In sinus rhythm now.  Holding Xarelto in case patient needs any procedures.  Resume once no more procedures planned.  Coronary artery disease with history of ischemic cardiomyopathy: Currently stable.  Acute kidney injury: Initial creatinine on admission at 1.14.  Resolved with IV fluids.    Hypophosphatemia: Replaced with improvement.    Replace as needed.  Obstructive sleep apnea: Uses CPAP at home and can use their own machine.  Code Status: Full code Family Communication: Wife at the bedside Disposition Plan: Potential discharge in the next 1 to 2 days once diet advanced and tolerating  p.o.   Consultants:  None  Procedures:  None  Antimicrobials:  IV cefepime and Flagyl 4/14-4/18  DVT  prophylaxis: SCDs while Xarelto on hold   Objective: Vitals:   02/13/20 2057 02/14/20 0424  BP: 132/73 130/78  Pulse: 91 91  Resp: 17 17  Temp: 98.1 F (36.7 C) (!) 97.5 F (36.4 C)  SpO2: 99% 97%    Intake/Output Summary (Last 24 hours) at 02/14/2020 1409 Last data filed at 02/14/2020 1057 Gross per 24 hour  Intake 340 ml  Output 3600 ml  Net -3260 ml   Filed Weights   02/13/20 1800 02/14/20 0510  Weight: 89.9 kg 85.9 kg   Body mass index is 24.99 kg/m.  Exam:   General: Alert and oriented x3, no acute distress  HEENT: Atraumatic, mucous members are moist  Neck: Thick, narrow airway  Cardiovascular: Regular rate and rhythm, S1-S2  Respiratory: Clear to auscultation bilaterally  Abdomen: Soft, nondistended.  Bowel sounds present.  Nontender  Musculoskeletal: No clubbing or cyanosis or edema    Data Reviewed: CBC: Recent Labs  Lab 02/09/20 0414 02/10/20 0719 02/11/20 0244 02/12/20 0603 02/13/20 0406  WBC 17.5* 12.8* 9.3 6.6 5.5  NEUTROABS  --  10.7* 6.9 4.6 3.6  HGB 14.0 12.2* 11.9* 11.6* 11.8*  HCT 42.7 36.2* 36.2* 34.5* 35.2*  MCV 95.3 93.5 94.5 93.5 92.1  PLT 180 159 175 168 016   Basic Metabolic Panel: Recent Labs  Lab 02/10/20 0719 02/11/20 0244 02/12/20 0603 02/13/20 0406 02/14/20 0314  NA 138 140 140 138 138  K 4.0 3.7 3.5 3.4* 3.4*  CL 103 110 110 110 107  CO2 22 20* 20* 16* 17*  GLUCOSE 161* 107* 106* 80 151*  BUN 22 20 13 9 9   CREATININE 1.14 1.04 0.95 0.96 1.04  CALCIUM 8.5* 8.5* 8.3* 8.1* 8.6*  MG 1.9 1.8 1.7  --   --   PHOS 3.6 1.8* 2.1*  --   --    GFR: Estimated Creatinine Clearance: 64 mL/min (by C-G formula based on SCr of 1.04 mg/dL). Liver Function Tests: Recent Labs  Lab 02/08/20 1621 02/09/20 0414 02/10/20 0719 02/11/20 0244  AST 23 19 18 20   ALT 22 21 21 23   ALKPHOS 72 59 50 49  BILITOT 0.9 0.6 1.0 0.9  PROT 6.6 6.6 6.0* 5.9*  ALBUMIN 2.8* 2.8* 2.4* 2.4*   No results for input(s): LIPASE, AMYLASE in  the last 168 hours. No results for input(s): AMMONIA in the last 168 hours. Coagulation Profile: No results for input(s): INR, PROTIME in the last 168 hours. Cardiac Enzymes: No results for input(s): CKTOTAL, CKMB, CKMBINDEX, TROPONINI in the last 168 hours. BNP (last 3 results) No results for input(s): PROBNP in the last 8760 hours. HbA1C: No results for input(s): HGBA1C in the last 72 hours. CBG: Recent Labs  Lab 02/13/20 1255 02/13/20 1654 02/13/20 2054 02/14/20 0756 02/14/20 1206  GLUCAP 83 76 89 136* 228*   Lipid Profile: No results for input(s): CHOL, HDL, LDLCALC, TRIG, CHOLHDL, LDLDIRECT in the last 72 hours. Thyroid Function Tests: No results for input(s): TSH, T4TOTAL, FREET4, T3FREE, THYROIDAB in the last 72 hours. Anemia Panel: No results for input(s): VITAMINB12, FOLATE, FERRITIN, TIBC, IRON, RETICCTPCT in the last 72 hours. Urine analysis:    Component Value Date/Time   COLORURINE YELLOW 02/08/2020 1624   APPEARANCEUR CLEAR 02/08/2020 1624   LABSPEC 1.011 02/08/2020 1624   PHURINE 6.0 02/08/2020 1624   GLUCOSEU >=500 (A) 02/08/2020 1624   HGBUR LARGE (  A) 02/08/2020 1624   BILIRUBINUR NEGATIVE 02/08/2020 1624   KETONESUR NEGATIVE 02/08/2020 1624   PROTEINUR NEGATIVE 02/08/2020 1624   UROBILINOGEN 0.2 12/12/2014 0150   NITRITE NEGATIVE 02/08/2020 1624   LEUKOCYTESUR NEGATIVE 02/08/2020 1624   Sepsis Labs: @LABRCNTIP (procalcitonin:4,lacticidven:4)  ) Recent Results (from the past 240 hour(s))  Respiratory Panel by RT PCR (Flu A&B, Covid) - Nasopharyngeal Swab     Status: None   Collection Time: 02/08/20  4:52 PM   Specimen: Nasopharyngeal Swab  Result Value Ref Range Status   SARS Coronavirus 2 by RT PCR NEGATIVE NEGATIVE Final    Comment: (NOTE) SARS-CoV-2 target nucleic acids are NOT DETECTED. The SARS-CoV-2 RNA is generally detectable in upper respiratoy specimens during the acute phase of infection. The lowest concentration of SARS-CoV-2 viral  copies this assay can detect is 131 copies/mL. A negative result does not preclude SARS-Cov-2 infection and should not be used as the sole basis for treatment or other patient management decisions. A negative result may occur with  improper specimen collection/handling, submission of specimen other than nasopharyngeal swab, presence of viral mutation(s) within the areas targeted by this assay, and inadequate number of viral copies (<131 copies/mL). A negative result must be combined with clinical observations, patient history, and epidemiological information. The expected result is Negative. Fact Sheet for Patients:  PinkCheek.be Fact Sheet for Healthcare Providers:  GravelBags.it This test is not yet ap proved or cleared by the [place] FDA and  has been authorized for detection and/or diagnosis of SARS-CoV-2 by FDA under an Emergency Use Authorization (EUA). This EUA will remain  in effect (meaning this test can be used) for the duration of the COVID-19 declaration under Section 564(b)(1) of the Act, 21 U.S.C. section 360bbb-3(b)(1), unless the authorization is terminated or revoked sooner.    Influenza A by PCR NEGATIVE NEGATIVE Final   Influenza B by PCR NEGATIVE NEGATIVE Final    Comment: (NOTE) The Xpert Xpress SARS-CoV-2/FLU/RSV assay is intended as an aid in  the diagnosis of influenza from Nasopharyngeal swab specimens and  should not be used as a sole basis for treatment. Nasal washings and  aspirates are unacceptable for Xpert Xpress SARS-CoV-2/FLU/RSV  testing. Fact Sheet for Patients: PinkCheek.be Fact Sheet for Healthcare Providers: GravelBags.it This test is not yet approved or cleared by the [place] FDA and  has been authorized for detection and/or diagnosis of SARS-CoV-2 by  FDA under an Emergency Use Authorization (EUA). This EUA will  remain  in effect (meaning this test can be used) for the duration of the  Covid-19 declaration under Section 564(b)(1) of the Act, 21  U.S.C. section 360bbb-3(b)(1), unless the authorization is  terminated or revoked. Performed at [facility], [place] [address], [place], [place] [address]   Blood Culture (routine x 2)     Status: None   Collection Time: 02/08/20 10:48 PM   Specimen: BLOOD  Result Value Ref Range Status   Specimen Description BLOOD RIGHT ARM  Final   Special Requests   Final    BOTTLES DRAWN AEROBIC AND ANAEROBIC Blood Culture adequate volume   Culture   Final    NO GROWTH 5 DAYS Performed at [facility], [place] [address] [place], [place] [address]    Report Status 02/14/2020 FINAL  Final  Blood Culture (routine x 2)     Status: None   Collection Time: 02/08/20 11:08 PM   Specimen: BLOOD  Result Value Ref Range Status   Specimen Description  BLOOD RIGHT FOREARM  Final   Special Requests   Final    BOTTLES DRAWN AEROBIC AND ANAEROBIC Blood Culture adequate volume   Culture   Final    NO GROWTH 5 DAYS Performed at [facility], [address], [place], [place] [address]    Report Status 02/14/2020 FINAL  Final  Gastrointestinal Panel by PCR , Stool     Status: None   Collection Time: 02/09/20  4:26 AM   Specimen: Stool  Result Value Ref Range Status   Campylobacter species NOT DETECTED NOT DETECTED Final   Plesimonas shigelloides NOT DETECTED NOT DETECTED Final   Salmonella species NOT DETECTED NOT DETECTED Final   Yersinia enterocolitica NOT DETECTED NOT DETECTED Final   Vibrio species NOT DETECTED NOT DETECTED Final   Vibrio cholerae NOT DETECTED NOT DETECTED Final   Enteroaggregative E coli (EAEC) NOT DETECTED NOT DETECTED Final   Enteropathogenic E coli (EPEC) NOT DETECTED NOT DETECTED Final   Enterotoxigenic E coli (ETEC) NOT DETECTED NOT DETECTED Final   Shiga like toxin producing E coli (STEC) NOT DETECTED NOT DETECTED Final    Shigella/Enteroinvasive E coli (EIEC) NOT DETECTED NOT DETECTED Final   Cryptosporidium NOT DETECTED NOT DETECTED Final   Cyclospora cayetanensis NOT DETECTED NOT DETECTED Final   Entamoeba histolytica NOT DETECTED NOT DETECTED Final   Giardia lamblia NOT DETECTED NOT DETECTED Final   Adenovirus F40/41 NOT DETECTED NOT DETECTED Final   Astrovirus NOT DETECTED NOT DETECTED Final   Norovirus GI/GII NOT DETECTED NOT DETECTED Final   Rotavirus A NOT DETECTED NOT DETECTED Final   Sapovirus (I, II, IV, and V) NOT DETECTED NOT DETECTED Final    Comment: Performed at [facility], [place] [address] [place], [place] [address]  C Difficile Quick Screen w PCR reflex     Status: None   Collection Time: 02/09/20  4:27 AM   Specimen: Stool  Result Value Ref Range Status   C Diff antigen NEGATIVE NEGATIVE Final   C Diff toxin NEGATIVE NEGATIVE Final   C Diff interpretation No C. difficile detected.  Final    Comment: Performed at [facility], [place] [address], [place], [place] [address]      Studies: No results found.  Scheduled Meds: . amitriptyline  25 mg Oral QHS  . atorvastatin  10 mg Oral Daily  . calcium carbonate  1 tablet Oral TID WC  . insulin aspart  0-15 Units Subcutaneous TID WC  . insulin aspart  0-5 Units Subcutaneous QHS  . metoprolol tartrate  2.5 mg Intravenous Q6H  . multivitamin with minerals  1 tablet Oral Daily  . olopatadine  1 drop Both Eyes Daily  . pantoprazole  40 mg Oral Daily  . potassium chloride  20 mEq Oral BID  . pregabalin  300 mg Oral BID  . vitamin B-12  1,000 mcg Oral Daily    Continuous Infusions:    LOS: 6 days   Total time spent: 25 minutes  Barb Merino, MD [facility]   02/14/2020, 2:09 PM

## 2020-02-14 NOTE — Progress Notes (Signed)
Patient has home CPAP and is able to place himself on without assistance. RT informed patient to have RT called if needed. RT will continue to monitor as needed.

## 2020-02-15 ENCOUNTER — Inpatient Hospital Stay (HOSPITAL_COMMUNITY): Payer: Medicare Other

## 2020-02-15 DIAGNOSIS — R202 Paresthesia of skin: Secondary | ICD-10-CM

## 2020-02-15 LAB — GLUCOSE, CAPILLARY
Glucose-Capillary: 217 mg/dL — ABNORMAL HIGH (ref 70–99)
Glucose-Capillary: 255 mg/dL — ABNORMAL HIGH (ref 70–99)
Glucose-Capillary: 165 mg/dL — ABNORMAL HIGH (ref 70–99)

## 2020-02-15 MED ORDER — RIVAROXABAN 20 MG PO TABS
20.0000 mg | ORAL_TABLET | Freq: Every day | ORAL | Status: DC
  Administered 2020-02-15 – 2020-02-17 (×3): 20 mg via ORAL
  Filled 2020-02-15 (×3): qty 1

## 2020-02-15 NOTE — Progress Notes (Signed)
ABI's have been completed. Preliminary results can be found in CV Proc through chart review.   02/15/20 1:51 PM [name] RVT

## 2020-02-15 NOTE — Progress Notes (Signed)
Patient has home CPAP at bedside and places on/off himself without assistance. RT will monitor as needed.

## 2020-02-15 NOTE — Evaluation (Signed)
Physical Therapy Evaluation Patient Details Name: [name] MRN: [id number] DOB: 07-Mar-1941 Today's Date: 02/15/2020   History of Present Illness  Pt is a 79 y/o male with hx of orthostatic hypotension, AAA repair, GERD, coronary artery disease, hyperlipidemia, hypertension, ischemic cardiomyopathy and diabetes, paroxysmal A. fib on Xarelto presented to the emergency room on 4/13 with dizziness and near passing out spells. EMS found SBP in 60s. Found with chronic cardiomegaly, CHF, acute colitis; 4/15 with abdominal distension and ileus, no obstruction.    Clinical Impression  Pt admitted with above diagnosis. Pt presents with limitation in mobility more due to his neuropathy than current ileus. Functioning near baseline mobility. Ambulated 150' with RW and min A. Will follow acutely but not recommending PT after discharge.  Pt currently with functional limitations due to the deficits listed below (see PT Problem List). Pt will benefit from skilled PT to increase their independence and safety with mobility to allow discharge to the venue listed below.       Follow Up Recommendations No PT follow up    Equipment Recommendations  None recommended by PT    Recommendations for Other Services       Precautions / Restrictions Precautions Precautions: Fall Restrictions Weight Bearing Restrictions: No      Mobility  Bed Mobility Overal bed mobility: Needs Assistance Bed Mobility: Supine to Sit     Supine to sit: Modified independent (Device/Increase time)     General bed mobility comments: HOB elevated but no assist required  Transfers Overall transfer level: Needs assistance Equipment used: Rolling walker (2 wheeled) Transfers: Sit to/from Stand Sit to Stand: Min guard;From elevated surface         General transfer comment: min guard to steady, increased effort to power up. Took 10-15 secs to steady self before being able to step. Posterior lean noted with initial  standing  Ambulation/Gait Ambulation/Gait assistance: Min assist Gait Distance (Feet): 150 Feet Assistive device: Rolling walker (2 wheeled) Gait Pattern/deviations: Decreased dorsiflexion - right;Decreased dorsiflexion - left;Decreased weight shift to right;Decreased weight shift to left Gait velocity: decreased Gait velocity interpretation: <1.8 ft/sec, indicate of risk for recurrent falls General Gait Details: Decreased step height bilaterally, decreased wt shift during gait and increased reaction time to perturbations  Stairs            Wheelchair Mobility    Modified Rankin (Stroke Patients Only)       Balance Overall balance assessment: Needs assistance Sitting-balance support: No upper extremity supported;Feet supported Sitting balance-Leahy Scale: Fair     Standing balance support: During functional activity;No upper extremity supported;Bilateral upper extremity supported Standing balance-Leahy Scale: Poor Standing balance comment: reliant on BUE support dynamically, preference to at least 1 UE support during ADLS                              Pertinent Vitals/Pain Pain Assessment: Faces Faces Pain Scale: Hurts a little bit Pain Location: B LEs, neuropathy Pain Descriptors / Indicators: Discomfort Pain Intervention(s): Limited activity within patient's tolerance;Monitored during session    Home Living Family/patient expects to be discharged to:: Private residence Living Arrangements: Spouse/significant other Available Help at Discharge: Family;Available 24 hours/day Type of Home: House Home Access: Stairs to enter Entrance Stairs-Rails: Right;Left;Can reach both Entrance Stairs-Number of Steps: 3 1/2 Home Layout: One level Home Equipment: Walker - 4 wheels;Cane - single point;Shower seat;Grab bars - toilet;Grab bars - tub/shower      Prior  Function Level of Independence: Independent with assistive device(s)         Comments: uses  rollators for mobility, independent with ADLs , still drives some. Has had some falls     Hand Dominance   Dominant Hand: Right    Extremity/Trunk Assessment   Upper Extremity Assessment Upper Extremity Assessment: Defer to OT evaluation    Lower Extremity Assessment Lower Extremity Assessment: RLE deficits/detail;LLE deficits/detail RLE Deficits / Details: pt tests 4+/5 for one time strength on MMT but functionally pt presents with generalized LE weakness, LE's fatigue quickly in [place] and pt with neuromuscular deficits due to neuropathy RLE Sensation: decreased proprioception;history of peripheral neuropathy RLE Coordination: decreased gross motor;decreased fine motor LLE Deficits / Details: same as RLE LLE Sensation: decreased proprioception;history of peripheral neuropathy LLE Coordination: decreased gross motor;decreased fine motor    Cervical / Trunk Assessment Cervical / Trunk Assessment: Kyphotic  Communication   Communication: No difficulties  Cognition Arousal/Alertness: Awake/alert Behavior During Therapy: WFL for tasks assessed/performed Overall Cognitive Status: Impaired/Different from baseline Area of Impairment: Memory;Problem solving;Attention                   Current Attention Level: Sustained Memory: Decreased short-term memory       Problem Solving: Requires verbal cues General Comments: patient with some repitition, slow processing and cueing for problem sovling       General Comments General comments (skin integrity, edema, etc.): VSS. Wife came end of session and has questions about nutrition at d/c, referred her to RN. Wife seemed independent and cognitively in tact. Discussed exercise with pt and his session with a personal trainer    Exercises     Assessment/Plan    PT Assessment Patient needs continued PT services  PT Problem List Decreased balance;Decreased activity tolerance;Decreased mobility;Decreased coordination;Decreased  cognition;Decreased safety awareness;Pain;Impaired sensation       PT Treatment Interventions DME instruction;Gait training;Stair training;Functional mobility training;Therapeutic activities;Therapeutic exercise;Balance training;Patient/family education    PT Goals (Current goals can be found in the Care Plan section)  Acute Rehab PT Goals Patient Stated Goal: to get home soon PT Goal Formulation: With patient Time For Goal Achievement: 02/29/20 Potential to Achieve Goals: Good    Frequency Min 3X/week   Barriers to discharge        Co-evaluation PT/OT/SLP Co-Evaluation/Treatment: Yes Reason for Co-Treatment: For patient/therapist safety PT goals addressed during session: Mobility/safety with mobility;Balance;Proper use of DME;Strengthening/ROM OT goals addressed during session: ADL's and self-care       AM-PAC PT "6 Clicks" Mobility  Outcome Measure Help needed turning from your back to your side while in a flat bed without using bedrails?: None Help needed moving from lying on your back to sitting on the side of a flat bed without using bedrails?: None Help needed moving to and from a bed to a chair (including a wheelchair)?: A Little Help needed standing up from a chair using your arms (e.g., wheelchair or bedside chair)?: A Little Help needed to walk in hospital room?: A Little Help needed climbing 3-5 steps with a railing? : A Little 6 Click Score: 20    End of Session Equipment Utilized During Treatment: Gait belt Activity Tolerance: Patient tolerated treatment well Patient left: in chair;with call bell/phone within reach;with family/visitor present Nurse Communication: Mobility status PT Visit Diagnosis: Unsteadiness on feet (R26.81);History of falling (Z91.81);Difficulty in walking, not elsewhere classified (R26.2)    Time: 1001-1035 PT Time Calculation (min) (ACUTE ONLY): 34 min   Charges:  PT Evaluation $PT Eval Moderate Complexity: 1 Mod           [place]  Pager [phone] Office [place] 02/15/2020, 1:12 PM

## 2020-02-15 NOTE — Evaluation (Signed)
Occupational Therapy Evaluation Patient Details Name: [name] MRN: [id number] DOB: 07/25/1941 Today's Date: 02/15/2020    History of Present Illness Pt is a 79 y/o male with hx of orthostatic hypotension, AAA repair, GERD, coronary artery disease, hyperlipidemia, hypertension, ischemic cardiomyopathy and diabetes, paroxysmal A. fib on Xarelto presented to the emergency room on 4/13 with dizziness and near passing out spells. EMS found SBP in 60s. Found with chronic cardiomegaly, CHF, acute colitis; 4/15 with abdominal distension and ileus, no obstruction.     Clinical Impression   PTA patient modified independent using RW for mobility and ADLs, admitted for above and limited by problem list below including impaired balance, decreased activity tolerance, generalized weakness.  He currently requires min assist for LB ADLs, min guard for transfers and in room mobility using RW, min guard for grooming.   Patient has good support of spouse to assist as needed, believe he is close to baseline level.  He will benefit from further OT services acutely to optimize independence and safety with ADLs, mobility but anticipate no further needs after dc home.     Follow Up Recommendations  No OT follow up;Supervision/Assistance - 24 hour    Equipment Recommendations  None recommended by OT    Recommendations for Other Services       Precautions / Restrictions Precautions Precautions: Fall Restrictions Weight Bearing Restrictions: No      Mobility Bed Mobility Overal bed mobility: Needs Assistance Bed Mobility: Supine to Sit     Supine to sit: Modified independent (Device/Increase time)     General bed mobility comments: HOB elevated but no assist required  Transfers Overall transfer level: Needs assistance Equipment used: Rolling walker (2 wheeled) Transfers: Sit to/from Stand Sit to Stand: Min guard;From elevated surface         General transfer comment: min guard to steady,  increased effort to power up     Balance Overall balance assessment: Needs assistance Sitting-balance support: No upper extremity supported;Feet supported Sitting balance-Leahy Scale: Fair     Standing balance support: During functional activity;No upper extremity supported;Bilateral upper extremity supported Standing balance-Leahy Scale: Poor Standing balance comment: relaint on BUE support dynamically, preference to at least 1 UE support during ADLS                            ADL either performed or assessed with clinical judgement   ADL Overall ADL's : Needs assistance/impaired     Grooming: Min guard;Standing Grooming Details (indicate cue type and reason): preference to lean against sink  Upper Body Bathing: Set up;Sitting   Lower Body Bathing: Min guard;Sit to/from stand   Upper Body Dressing : Set up;Sitting   Lower Body Dressing: Minimal assistance;Sit to/from stand Lower Body Dressing Details (indicate cue type and reason): assist for shoes, able to don socks; min guard sit to stand  Toilet Transfer: Min guard;Ambulation;Comfort height toilet;RW;Grab bars   Toileting- Clothing Manipulation and Hygiene: Minimal assistance;Sit to/from stand Toileting - Clothing Manipulation Details (indicate cue type and reason): clothing mgmt     Functional mobility during ADLs: Min guard;Rolling walker General ADL Comments: pt limited by impaired balance, decreased activity tolerance     Vision   Vision Assessment?: No apparent visual deficits     Perception     Praxis      Pertinent Vitals/Pain Pain Assessment: Faces Faces Pain Scale: Hurts a little bit Pain Location: B LEs, neuropathy Pain Descriptors / Indicators: Discomfort  Pain Intervention(s): Limited activity within patient's tolerance;Monitored during session;Repositioned     Hand Dominance Right   Extremity/Trunk Assessment Upper Extremity Assessment Upper Extremity Assessment: Overall WFL for  tasks assessed   Lower Extremity Assessment Lower Extremity Assessment: Defer to PT evaluation       Communication Communication Communication: No difficulties   Cognition Arousal/Alertness: Awake/alert Behavior During Therapy: WFL for tasks assessed/performed Overall Cognitive Status: Impaired/Different from baseline Area of Impairment: Memory;Problem solving;Attention                   Current Attention Level: Sustained Memory: Decreased short-term memory       Problem Solving: Requires verbal cues General Comments: patient with some repitition, slow processing and cueing for problem sovling    General Comments  VSS    Exercises     Shoulder Instructions      Home Living Family/patient expects to be discharged to:: Private residence Living Arrangements: Spouse/significant other Available Help at Discharge: Family;Available 24 hours/day Type of Home: House Home Access: Stairs to enter [employer] of Steps: 3 1/2 Entrance Stairs-Rails: Right;Left;Can reach both Home Layout: One level     Bathroom Shower/Tub: Occupational psychologist: Standard     Home Equipment: Environmental consultant - 4 wheels;Cane - single point;Shower seat;Grab bars - toilet;Grab bars - tub/shower          Prior Functioning/Environment Level of Independence: Independent with assistive device(s)        Comments: uses rollators for mobility, independent with ADLs         OT Problem List: Decreased strength;Decreased activity tolerance;Impaired balance (sitting and/or standing);Decreased coordination      OT Treatment/Interventions: Self-care/ADL training;Energy conservation;DME and/or AE instruction;Therapeutic activities;Cognitive remediation/compensation;Patient/family education;Balance training;Therapeutic exercise    OT Goals(Current goals can be found in the care plan section) Acute Rehab OT Goals Patient Stated Goal: to get home soon OT Goal Formulation: With  patient Time For Goal Achievement: 02/29/20 Potential to Achieve Goals: Good  OT Frequency: Min 2X/week   Barriers to D/C:            Co-evaluation PT/OT/SLP Co-Evaluation/Treatment: Yes Reason for Co-Treatment: For patient/therapist safety;To address functional/ADL transfers   OT goals addressed during session: ADL's and self-care      AM-PAC OT "6 Clicks" Daily Activity     Outcome Measure Help from another person eating meals?: None Help from another person taking care of personal grooming?: A Little Help from another person toileting, which includes using toliet, bedpan, or urinal?: A Little Help from another person bathing (including washing, rinsing, drying)?: A Little Help from another person to put on and taking off regular upper body clothing?: A Little Help from another person to put on and taking off regular lower body clothing?: A Little 6 Click Score: 19   End of Session Equipment Utilized During Treatment: Gait belt;Rolling walker Nurse Communication: Mobility status  Activity Tolerance: Patient tolerated treatment well Patient left: in chair;with call bell/phone within reach;with family/visitor present  OT Visit Diagnosis: Other abnormalities of gait and mobility (R26.89);Muscle weakness (generalized) (M62.81)                Time: 6720-9470 OT Time Calculation (min): 35 min Charges:  OT General Charges $OT Visit: 1 Visit OT Evaluation $OT Eval Moderate Complexity: 1 Mod  Jolaine Artist, OT Acute Rehabilitation Services Pager [phone] Office [phone]   Delight Stare 02/15/2020, 10:46 AM

## 2020-02-15 NOTE — Progress Notes (Signed)
PROGRESS NOTE  [name] QBH:[id number] DOB: 26-Jan-1941 DOA: 02/08/2020 PCP: [name], MD  HPI/Recap of past 65 hours: 79 year old gentleman with history of orthostatic hypotension, AAA repair, GERD, coronary artery disease, hyperlipidemia, hypertension, ischemic cardiomyopathy and diabetes, paroxysmal A. fib on Xarelto presented to the emergency room on 4/13 with dizziness and near passing out spells.  Patient felt weak and dizzy.  When EMS arrived his systolic blood pressures were 60s.  Patient also started having explosive diarrhea.  In the emergency room, afebrile.  Blood pressures initially 61/46 with heart rate of 86 that responded well to fluid resuscitation.  Oxygen 87% on room air.  Lactic acid 2.9.  Rest of the chemistry and CBC were unremarkable.  Chest x-ray showed CHF with cardiomegaly mostly chronic.  CT scan of the abdomen pelvis showed focally thickened segment of the distal descending colon and proximal sigmoid colon with surrounding pericolonic fatty stranding.  Admitted with fluid and antibiotics.  4/15, diarrhea improved but patient developed abdominal distention and ileus.  A KUB x-ray showed huge stomach with no intestinal obstruction.  NG tube was placed, 2 L gastric secretions out with clinical improvement.  By 4/16, patient had not had a BM, but was feeling better and his NG tube was clamped.  Over the next 2 days, he has been tolerating this well and started to have some very mild bowel sounds.     Assessment/Plan: Principal Problem:   Ileus ([place]) Active Problems:   Hypertension   Sleep apnea   DM (diabetes mellitus), type 2, uncontrolled w/neurologic complication (HCC)   Paroxysmal atrial fibrillation with RVR (HCC)   Coronary artery disease involving native coronary artery of native heart with angina pectoris (HCC)   Chronic combined systolic and diastolic heart failure ([place])   Long term current use of anticoagulant therapy   Hyperlipidemia   AKI (acute  kidney injury) ([place])   Near syncope   Hypotension   Acute colitis Acute respiratory failure with hypoxia  Sepsis present on admission due to acute colitis: Infective versus ischemic colitis. Sepsis is resolving. Diarrhea improved. Blood pressures responded adequate, lactate normalized. C. difficile and GI pathogen panel negative. Unfortunately, 4/15 evening started having distention.  Now with ileus.  Has completed 5 days of antibiotics.  Ileus/gastric distention: Patient developed significant ileus with gastric distention.  NG tube, more than 2 L drained.  Symptomatically improving.  No bowel movements yet. KUB showed improvement of gastric distention.  No evidence of bowel abnormalities. Tolerating full liquid diet.  Advance to soft diet.  Ambulate in the hallway.  Syncope/vasovagal: Suspect due to dehydration with orthostasis.  Patient does have history of chronic orthostatic changes.  Discontinue ACE inhibitor's.  Continue IV and oral hydration.  Will monitor.  Normal orthostatic.  Hypertension: Hypotensive on arrival.  Keeping off antihypertensives.  Blood pressure stable.  Paroxysmal A. fib: Rate controlled.  In sinus rhythm now. Resume Xarelto  Coronary artery disease with history of ischemic cardiomyopathy: Currently stable.  Acute kidney injury: Initial creatinine on admission at 1.14.  Resolved with IV fluids.    Hypophosphatemia: Replaced with improvement.    Replace as needed.  Obstructive sleep apnea: Uses CPAP at home and can use their own machine.  Code Status: Full code Family Communication: Wife at the bedside Disposition Plan: Potential discharge in the next 1 to 2 days once diet advanced and tolerating p.o.   Consultants:  None  Procedures:  None  Antimicrobials:  IV cefepime and Flagyl 4/14-4/18  DVT prophylaxis:  SCDs while Xarelto on hold   Objective: Vitals:   02/14/20 2109 02/15/20 0612  BP: 117/74 118/70  Pulse: 80 75  Resp: 18 16   Temp: 98 F (36.7 C) 98 F (36.7 C)  SpO2: 95% 97%    Intake/Output Summary (Last 24 hours) at 02/15/2020 1145 Last data filed at 02/14/2020 1532 Gross per 24 hour  Intake 240 ml  Output 1200 ml  Net -960 ml   Filed Weights   02/13/20 1800 02/14/20 0510  Weight: 89.9 kg 85.9 kg   Body mass index is 24.99 kg/m.  Exam:   General: Alert and oriented x3, no acute distress  HEENT: Atraumatic, mucous members are moist  Neck: Thick, narrow airway  Cardiovascular: Regular rate and rhythm, S1-S2  Respiratory: Clear to auscultation bilaterally  Abdomen: Soft, nondistended.  Bowel sounds present.  Nontender  Musculoskeletal: No clubbing or cyanosis or edema    Data Reviewed: CBC: Recent Labs  Lab 02/09/20 0414 02/10/20 0719 02/11/20 0244 02/12/20 0603 02/13/20 0406  WBC 17.5* 12.8* 9.3 6.6 5.5  NEUTROABS  --  10.7* 6.9 4.6 3.6  HGB 14.0 12.2* 11.9* 11.6* 11.8*  HCT 42.7 36.2* 36.2* 34.5* 35.2*  MCV 95.3 93.5 94.5 93.5 92.1  PLT 180 159 175 168 604   Basic Metabolic Panel: Recent Labs  Lab 02/10/20 0719 02/11/20 0244 02/12/20 0603 02/13/20 0406 02/14/20 0314  NA 138 140 140 138 138  K 4.0 3.7 3.5 3.4* 3.4*  CL 103 110 110 110 107  CO2 22 20* 20* 16* 17*  GLUCOSE 161* 107* 106* 80 151*  BUN 22 20 13 9 9   CREATININE 1.14 1.04 0.95 0.96 1.04  CALCIUM 8.5* 8.5* 8.3* 8.1* 8.6*  MG 1.9 1.8 1.7  --   --   PHOS 3.6 1.8* 2.1*  --   --    GFR: Estimated Creatinine Clearance: 64 mL/min (by C-G formula based on SCr of 1.04 mg/dL). Liver Function Tests: Recent Labs  Lab 02/08/20 1621 02/09/20 0414 02/10/20 0719 02/11/20 0244  AST 23 19 18 20   ALT 22 21 21 23   ALKPHOS 72 59 50 49  BILITOT 0.9 0.6 1.0 0.9  PROT 6.6 6.6 6.0* 5.9*  ALBUMIN 2.8* 2.8* 2.4* 2.4*   No results for input(s): LIPASE, AMYLASE in the last 168 hours. No results for input(s): AMMONIA in the last 168 hours. Coagulation Profile: No results for input(s): INR, PROTIME in the last 168  hours. Cardiac Enzymes: No results for input(s): CKTOTAL, CKMB, CKMBINDEX, TROPONINI in the last 168 hours. BNP (last 3 results) No results for input(s): PROBNP in the last 8760 hours. HbA1C: No results for input(s): HGBA1C in the last 72 hours. CBG: Recent Labs  Lab 02/14/20 0756 02/14/20 1206 02/14/20 1701 02/14/20 2111 02/15/20 0752  GLUCAP 136* 228* 227* 175* 217*   Lipid Profile: No results for input(s): CHOL, HDL, LDLCALC, TRIG, CHOLHDL, LDLDIRECT in the last 72 hours. Thyroid Function Tests: No results for input(s): TSH, T4TOTAL, FREET4, T3FREE, THYROIDAB in the last 72 hours. Anemia Panel: No results for input(s): VITAMINB12, FOLATE, FERRITIN, TIBC, IRON, RETICCTPCT in the last 72 hours. Urine analysis:    Component Value Date/Time   COLORURINE YELLOW 02/08/2020 1624   APPEARANCEUR CLEAR 02/08/2020 1624   LABSPEC 1.011 02/08/2020 1624   PHURINE 6.0 02/08/2020 1624   GLUCOSEU >=500 (A) 02/08/2020 1624   HGBUR LARGE (A) 02/08/2020 1624   BILIRUBINUR NEGATIVE 02/08/2020 [place] 02/08/2020 1624   PROTEINUR NEGATIVE 02/08/2020 1624  UROBILINOGEN 0.2 12/12/2014 0150   NITRITE NEGATIVE 02/08/2020 1624   LEUKOCYTESUR NEGATIVE 02/08/2020 1624   Sepsis Labs: @LABRCNTIP (procalcitonin:4,lacticidven:4)  ) Recent Results (from the past 240 hour(s))  Respiratory Panel by RT PCR (Flu A&B, Covid) - Nasopharyngeal Swab     Status: None   Collection Time: 02/08/20  4:52 PM   Specimen: Nasopharyngeal Swab  Result Value Ref Range Status   SARS Coronavirus 2 by RT PCR NEGATIVE NEGATIVE Final    Comment: (NOTE) SARS-CoV-2 target nucleic acids are NOT DETECTED. The SARS-CoV-2 RNA is generally detectable in upper respiratoy specimens during the acute phase of infection. The lowest concentration of SARS-CoV-2 viral copies this assay can detect is 131 copies/mL. A negative result does not preclude SARS-Cov-2 infection and should not be used as the sole basis for  treatment or other patient management decisions. A negative result may occur with  improper specimen collection/handling, submission of specimen other than nasopharyngeal swab, presence of viral mutation(s) within the areas targeted by this assay, and inadequate number of viral copies (<131 copies/mL). A negative result must be combined with clinical observations, patient history, and epidemiological information. The expected result is Negative. Fact Sheet for Patients:  PinkCheek.be Fact Sheet for Healthcare Providers:  GravelBags.it This test is not yet ap proved or cleared by the [place] FDA and  has been authorized for detection and/or diagnosis of SARS-CoV-2 by FDA under an Emergency Use Authorization (EUA). This EUA will remain  in effect (meaning this test can be used) for the duration of the COVID-19 declaration under Section 564(b)(1) of the Act, 21 U.S.C. section 360bbb-3(b)(1), unless the authorization is terminated or revoked sooner.    Influenza A by PCR NEGATIVE NEGATIVE Final   Influenza B by PCR NEGATIVE NEGATIVE Final    Comment: (NOTE) The Xpert Xpress SARS-CoV-2/FLU/RSV assay is intended as an aid in  the diagnosis of influenza from Nasopharyngeal swab specimens and  should not be used as a sole basis for treatment. Nasal washings and  aspirates are unacceptable for Xpert Xpress SARS-CoV-2/FLU/RSV  testing. Fact Sheet for Patients: PinkCheek.be Fact Sheet for Healthcare Providers: GravelBags.it This test is not yet approved or cleared by the [place] FDA and  has been authorized for detection and/or diagnosis of SARS-CoV-2 by  FDA under an Emergency Use Authorization (EUA). This EUA will remain  in effect (meaning this test can be used) for the duration of the  Covid-19 declaration under Section 564(b)(1) of the Act, 21  U.S.C. section  360bbb-3(b)(1), unless the authorization is  terminated or revoked. Performed at [facility], [place] [address], [place], [place] [address]   Blood Culture (routine x 2)     Status: None   Collection Time: 02/08/20 10:48 PM   Specimen: BLOOD  Result Value Ref Range Status   Specimen Description BLOOD RIGHT ARM  Final   Special Requests   Final    BOTTLES DRAWN AEROBIC AND ANAEROBIC Blood Culture adequate volume   Culture   Final    NO GROWTH 5 DAYS Performed at [facility], [place] [address] [place], [place] [address]    Report Status 02/14/2020 FINAL  Final  Blood Culture (routine x 2)     Status: None   Collection Time: 02/08/20 11:08 PM   Specimen: BLOOD  Result Value Ref Range Status   Specimen Description BLOOD RIGHT FOREARM  Final   Special Requests   Final    BOTTLES DRAWN AEROBIC AND ANAEROBIC Blood Culture adequate  volume   Culture   Final    NO GROWTH 5 DAYS Performed at [facility], [place] [address], [place], [place] [address]    Report Status 02/14/2020 FINAL  Final  Gastrointestinal Panel by PCR , Stool     Status: None   Collection Time: 02/09/20  4:26 AM   Specimen: Stool  Result Value Ref Range Status   Campylobacter species NOT DETECTED NOT DETECTED Final   Plesimonas shigelloides NOT DETECTED NOT DETECTED Final   Salmonella species NOT DETECTED NOT DETECTED Final   Yersinia enterocolitica NOT DETECTED NOT DETECTED Final   Vibrio species NOT DETECTED NOT DETECTED Final   Vibrio cholerae NOT DETECTED NOT DETECTED Final   Enteroaggregative E coli (EAEC) NOT DETECTED NOT DETECTED Final   Enteropathogenic E coli (EPEC) NOT DETECTED NOT DETECTED Final   Enterotoxigenic E coli (ETEC) NOT DETECTED NOT DETECTED Final   Shiga like toxin producing E coli (STEC) NOT DETECTED NOT DETECTED Final   Shigella/Enteroinvasive E coli (EIEC) NOT DETECTED NOT DETECTED Final   Cryptosporidium NOT DETECTED NOT DETECTED Final   Cyclospora cayetanensis NOT DETECTED  NOT DETECTED Final   Entamoeba histolytica NOT DETECTED NOT DETECTED Final   Giardia lamblia NOT DETECTED NOT DETECTED Final   Adenovirus F40/41 NOT DETECTED NOT DETECTED Final   Astrovirus NOT DETECTED NOT DETECTED Final   Norovirus GI/GII NOT DETECTED NOT DETECTED Final   Rotavirus A NOT DETECTED NOT DETECTED Final   Sapovirus (I, II, IV, and V) NOT DETECTED NOT DETECTED Final    Comment: Performed at [facility], [place] [place], [place] [address]  C Difficile Quick Screen w PCR reflex     Status: None   Collection Time: 02/09/20  4:27 AM   Specimen: Stool  Result Value Ref Range Status   C Diff antigen NEGATIVE NEGATIVE Final   C Diff toxin NEGATIVE NEGATIVE Final   C Diff interpretation No C. difficile detected.  Final    Comment: Performed at [facility], [place] [address], [place], [place] [address]      Studies: No results found.  Scheduled Meds: . amitriptyline  25 mg Oral QHS  . atorvastatin  10 mg Oral Daily  . calcium carbonate  1 tablet Oral TID WC  . insulin aspart  0-15 Units Subcutaneous TID WC  . insulin aspart  0-5 Units Subcutaneous QHS  . metoprolol tartrate  2.5 mg Intravenous Q6H  . multivitamin with minerals  1 tablet Oral Daily  . olopatadine  1 drop Both Eyes Daily  . pantoprazole  40 mg Oral Daily  . potassium chloride  20 mEq Oral BID  . pregabalin  300 mg Oral BID  . rivaroxaban  20 mg Oral Q supper  . vitamin B-12  1,000 mcg Oral Daily    Continuous Infusions:    LOS: 7 days   Total time spent: 25 minutes  Barb Merino, MD [facility]   02/15/2020, 11:45 AM

## 2020-02-16 LAB — GLUCOSE, CAPILLARY
Glucose-Capillary: 210 mg/dL — ABNORMAL HIGH (ref 70–99)
Glucose-Capillary: 199 mg/dL — ABNORMAL HIGH (ref 70–99)
Glucose-Capillary: 258 mg/dL — ABNORMAL HIGH (ref 70–99)
Glucose-Capillary: 219 mg/dL — ABNORMAL HIGH (ref 70–99)
Glucose-Capillary: 223 mg/dL — ABNORMAL HIGH (ref 70–99)

## 2020-02-16 MED ORDER — POLYETHYLENE GLYCOL 3350 17 G PO PACK
17.0000 g | PACK | Freq: Once | ORAL | Status: AC
  Administered 2020-02-16: 17 g via ORAL
  Filled 2020-02-16: qty 1

## 2020-02-16 MED ORDER — BISACODYL 10 MG RE SUPP
10.0000 mg | Freq: Once | RECTAL | Status: AC
  Administered 2020-02-16: 10 mg via RECTAL
  Filled 2020-02-16: qty 1

## 2020-02-16 NOTE — Progress Notes (Signed)
PROGRESS NOTE  [name] HCW:[id number] DOB: 1941/09/03 DOA: 02/08/2020 PCP: [name], MD  HPI/Recap of past 34 hours: 79 year old gentleman with history of orthostatic hypotension, AAA repair, GERD, coronary artery disease, hyperlipidemia, hypertension, ischemic cardiomyopathy and diabetes, paroxysmal A. fib on Xarelto presented to the emergency room on 4/13 with dizziness and near passing out spells.  Patient felt weak and dizzy.  When EMS arrived his systolic blood pressures were 60s.  Patient also started having explosive diarrhea.  In the emergency room, afebrile.  Blood pressures initially 61/46 with heart rate of 86 that responded well to fluid resuscitation.  Oxygen 87% on room air.  Lactic acid 2.9.  Rest of the chemistry and CBC were unremarkable.  Chest x-ray showed CHF with cardiomegaly mostly chronic.  CT scan of the abdomen pelvis showed focally thickened segment of the distal descending colon and proximal sigmoid colon with surrounding pericolonic fatty stranding.  Admitted with fluid and antibiotics.  SUBJECTIVE: 4/15, diarrhea improved but patient developed abdominal distention and ileus.  A KUB x-ray showed huge stomach with no intestinal obstruction.  NG tube was placed, 2 L gastric secretions out with clinical improvement.  By 4/16, patient had not had a BM, but was feeling better and his NG tube was clamped.  Over the next 2 days, he has been tolerating this well and started to have some very mild bowel sounds.    4/21, no bowel movements yet. Passing gas. Denies any complaints .   Assessment/Plan: Principal Problem:   Ileus ([place]) Active Problems:   Hypertension   Sleep apnea   DM (diabetes mellitus), type 2, uncontrolled w/neurologic complication (HCC)   Paroxysmal atrial fibrillation with RVR (HCC)   Coronary artery disease involving native coronary artery of native heart with angina pectoris (HCC)   Chronic combined systolic and diastolic heart failure  ([place])   Long term current use of anticoagulant therapy   Hyperlipidemia   AKI (acute kidney injury) ([place])   Near syncope   Hypotension   Acute colitis Acute respiratory failure with hypoxia  Sepsis present on admission due to acute colitis: Infective versus ischemic colitis. Sepsis is resolving. Diarrhea improved. Blood pressures responded adequate, lactate normalized. C. difficile and GI pathogen panel negative. Unfortunately, 4/15 evening started having distention.  Now with ileus.  Has completed 5 days of antibiotics.  Ileus/gastric distention: Patient developed significant ileus with gastric distention.  NG tube, more than 2 L drained.  Symptomatically improving.  No bowel movements yet. KUB showed improvement of gastric distention.  No evidence of bowel abnormalities. Tolerating soft diet. Passing flatus. No BM One dose of dulcolax suppository and if no BM will give him a dose of miralax tonight.   Syncope/vasovagal: Suspect due to dehydration with orthostasis.  Patient does have history of chronic orthostatic changes.  Discontinue ACE inhibitor's.  Continue IV and oral hydration.  Will monitor.  Normal orthostatic.  Hypertension: Hypotensive on arrival.  Keeping off antihypertensives.  Blood pressure stable.  Paroxysmal A. fib: Rate controlled.  In sinus rhythm now. Resume Xarelto  Coronary artery disease with history of ischemic cardiomyopathy: Currently stable.  Acute kidney injury: Initial creatinine on admission at 1.14.  Resolved with IV fluids.    Hypophosphatemia: Replaced with improvement.    Replace as needed. Recheck in morning.  Obstructive sleep apnea: Uses CPAP at home and can use their own machine.  Code Status: Full code Family Communication: Wife at the bedside Disposition Plan: Potential discharge tomorrow if BM  Consultants:  None  Procedures:  None  Antimicrobials:  IV cefepime and Flagyl 4/14-4/18  DVT  prophylaxis: xARELTO   Objective: Vitals:   02/16/20 0417 02/16/20 1539  BP: 126/76 110/68  Pulse: 88 81  Resp: 18 16  Temp: 97.9 F (36.6 C) 98.9 F (37.2 C)  SpO2: 99%     Intake/Output Summary (Last 24 hours) at 02/16/2020 1548 Last data filed at 02/16/2020 1146 Gross per 24 hour  Intake 540 ml  Output --  Net 540 ml   Filed Weights   02/13/20 1800 02/14/20 0510  Weight: 89.9 kg 85.9 kg   Body mass index is 24.99 kg/m.  Exam:   General: Alert and oriented x3, no acute distress  HEENT: Atraumatic, mucous members are moist  Neck: Thick, narrow airway  Cardiovascular: Regular rate and rhythm, S1-S2  Respiratory: Clear to auscultation bilaterally  Abdomen: Soft, nondistended.  Bowel sounds present.  Nontender  Musculoskeletal: No clubbing or cyanosis or edema    Data Reviewed: CBC: Recent Labs  Lab 02/10/20 0719 02/11/20 0244 02/12/20 0603 02/13/20 0406  WBC 12.8* 9.3 6.6 5.5  NEUTROABS 10.7* 6.9 4.6 3.6  HGB 12.2* 11.9* 11.6* 11.8*  HCT 36.2* 36.2* 34.5* 35.2*  MCV 93.5 94.5 93.5 92.1  PLT 159 175 168 426   Basic Metabolic Panel: Recent Labs  Lab 02/10/20 0719 02/11/20 0244 02/12/20 0603 02/13/20 0406 02/14/20 0314  NA 138 140 140 138 138  K 4.0 3.7 3.5 3.4* 3.4*  CL 103 110 110 110 107  CO2 22 20* 20* 16* 17*  GLUCOSE 161* 107* 106* 80 151*  BUN 22 20 13 9 9   CREATININE 1.14 1.04 0.95 0.96 1.04  CALCIUM 8.5* 8.5* 8.3* 8.1* 8.6*  MG 1.9 1.8 1.7  --   --   PHOS 3.6 1.8* 2.1*  --   --    GFR: Estimated Creatinine Clearance: 64 mL/min (by C-G formula based on SCr of 1.04 mg/dL). Liver Function Tests: Recent Labs  Lab 02/10/20 0719 02/11/20 0244  AST 18 20  ALT 21 23  ALKPHOS 50 49  BILITOT 1.0 0.9  PROT 6.0* 5.9*  ALBUMIN 2.4* 2.4*   No results for input(s): LIPASE, AMYLASE in the last 168 hours. No results for input(s): AMMONIA in the last 168 hours. Coagulation Profile: No results for input(s): INR, PROTIME in the last  168 hours. Cardiac Enzymes: No results for input(s): CKTOTAL, CKMB, CKMBINDEX, TROPONINI in the last 168 hours. BNP (last 3 results) No results for input(s): PROBNP in the last 8760 hours. HbA1C: No results for input(s): HGBA1C in the last 72 hours. CBG: Recent Labs  Lab 02/15/20 1148 02/15/20 1651 02/15/20 2032 02/16/20 0746 02/16/20 1148  GLUCAP 255* 210* 165* 199* 258*   Lipid Profile: No results for input(s): CHOL, HDL, LDLCALC, TRIG, CHOLHDL, LDLDIRECT in the last 72 hours. Thyroid Function Tests: No results for input(s): TSH, T4TOTAL, FREET4, T3FREE, THYROIDAB in the last 72 hours. Anemia Panel: No results for input(s): VITAMINB12, FOLATE, FERRITIN, TIBC, IRON, RETICCTPCT in the last 72 hours. Urine analysis:    Component Value Date/Time   COLORURINE YELLOW 02/08/2020 1624   APPEARANCEUR CLEAR 02/08/2020 1624   LABSPEC 1.011 02/08/2020 1624   PHURINE 6.0 02/08/2020 1624   GLUCOSEU >=500 (A) 02/08/2020 1624   HGBUR LARGE (A) 02/08/2020 1624   BILIRUBINUR NEGATIVE 02/08/2020 1624   KETONESUR NEGATIVE 02/08/2020 1624   PROTEINUR NEGATIVE 02/08/2020 1624   UROBILINOGEN 0.2 12/12/2014 0150   NITRITE NEGATIVE 02/08/2020 1624  LEUKOCYTESUR NEGATIVE 02/08/2020 1624   Sepsis Labs: @LABRCNTIP (procalcitonin:4,lacticidven:4)  ) Recent Results (from the past 240 hour(s))  Respiratory Panel by RT PCR (Flu A&B, Covid) - Nasopharyngeal Swab     Status: None   Collection Time: 02/08/20  4:52 PM   Specimen: Nasopharyngeal Swab  Result Value Ref Range Status   SARS Coronavirus 2 by RT PCR NEGATIVE NEGATIVE Final    Comment: (NOTE) SARS-CoV-2 target nucleic acids are NOT DETECTED. The SARS-CoV-2 RNA is generally detectable in upper respiratoy specimens during the acute phase of infection. The lowest concentration of SARS-CoV-2 viral copies this assay can detect is 131 copies/mL. A negative result does not preclude SARS-Cov-2 infection and should not be used as the sole basis  for treatment or other patient management decisions. A negative result may occur with  improper specimen collection/handling, submission of specimen other than nasopharyngeal swab, presence of viral mutation(s) within the areas targeted by this assay, and inadequate number of viral copies (<131 copies/mL). A negative result must be combined with clinical observations, patient history, and epidemiological information. The expected result is Negative. Fact Sheet for Patients:  PinkCheek.be Fact Sheet for Healthcare Providers:  GravelBags.it This test is not yet ap proved or cleared by the [place] FDA and  has been authorized for detection and/or diagnosis of SARS-CoV-2 by FDA under an Emergency Use Authorization (EUA). This EUA will remain  in effect (meaning this test can be used) for the duration of the COVID-19 declaration under Section 564(b)(1) of the Act, 21 U.S.C. section 360bbb-3(b)(1), unless the authorization is terminated or revoked sooner.    Influenza A by PCR NEGATIVE NEGATIVE Final   Influenza B by PCR NEGATIVE NEGATIVE Final    Comment: (NOTE) The Xpert Xpress SARS-CoV-2/FLU/RSV assay is intended as an aid in  the diagnosis of influenza from Nasopharyngeal swab specimens and  should not be used as a sole basis for treatment. Nasal washings and  aspirates are unacceptable for Xpert Xpress SARS-CoV-2/FLU/RSV  testing. Fact Sheet for Patients: PinkCheek.be Fact Sheet for Healthcare Providers: GravelBags.it This test is not yet approved or cleared by the [place] FDA and  has been authorized for detection and/or diagnosis of SARS-CoV-2 by  FDA under an Emergency Use Authorization (EUA). This EUA will remain  in effect (meaning this test can be used) for the duration of the  Covid-19 declaration under Section 564(b)(1) of the Act, 21  U.S.C.  section 360bbb-3(b)(1), unless the authorization is  terminated or revoked. Performed at [facility], [place] [address] [place], [place] [address]   Blood Culture (routine x 2)     Status: None   Collection Time: 02/08/20 10:48 PM   Specimen: BLOOD  Result Value Ref Range Status   Specimen Description BLOOD RIGHT ARM  Final   Special Requests   Final    BOTTLES DRAWN AEROBIC AND ANAEROBIC Blood Culture adequate volume   Culture   Final    NO GROWTH 5 DAYS Performed at [facility], [place] [address], [place], [place] [address]    Report Status 02/14/2020 FINAL  Final  Blood Culture (routine x 2)     Status: None   Collection Time: 02/08/20 11:08 PM   Specimen: BLOOD  Result Value Ref Range Status   Specimen Description BLOOD RIGHT FOREARM  Final   Special Requests   Final    BOTTLES DRAWN AEROBIC AND ANAEROBIC Blood Culture adequate volume   Culture   Final    NO GROWTH  5 DAYS Performed at [facility], [place] [address] [place], [place] [address]    Report Status 02/14/2020 FINAL  Final  Gastrointestinal Panel by PCR , Stool     Status: None   Collection Time: 02/09/20  4:26 AM   Specimen: Stool  Result Value Ref Range Status   Campylobacter species NOT DETECTED NOT DETECTED Final   Plesimonas shigelloides NOT DETECTED NOT DETECTED Final   Salmonella species NOT DETECTED NOT DETECTED Final   Yersinia enterocolitica NOT DETECTED NOT DETECTED Final   Vibrio species NOT DETECTED NOT DETECTED Final   Vibrio cholerae NOT DETECTED NOT DETECTED Final   Enteroaggregative E coli (EAEC) NOT DETECTED NOT DETECTED Final   Enteropathogenic E coli (EPEC) NOT DETECTED NOT DETECTED Final   Enterotoxigenic E coli (ETEC) NOT DETECTED NOT DETECTED Final   Shiga like toxin producing E coli (STEC) NOT DETECTED NOT DETECTED Final   Shigella/Enteroinvasive E coli (EIEC) NOT DETECTED NOT DETECTED Final   Cryptosporidium NOT DETECTED NOT DETECTED Final   Cyclospora cayetanensis NOT  DETECTED NOT DETECTED Final   Entamoeba histolytica NOT DETECTED NOT DETECTED Final   Giardia lamblia NOT DETECTED NOT DETECTED Final   Adenovirus F40/41 NOT DETECTED NOT DETECTED Final   Astrovirus NOT DETECTED NOT DETECTED Final   Norovirus GI/GII NOT DETECTED NOT DETECTED Final   Rotavirus A NOT DETECTED NOT DETECTED Final   Sapovirus (I, II, IV, and V) NOT DETECTED NOT DETECTED Final    Comment: Performed at [facility], [place] [place], [place] [address]  C Difficile Quick Screen w PCR reflex     Status: None   Collection Time: 02/09/20  4:27 AM   Specimen: Stool  Result Value Ref Range Status   C Diff antigen NEGATIVE NEGATIVE Final   C Diff toxin NEGATIVE NEGATIVE Final   C Diff interpretation No C. difficile detected.  Final    Comment: Performed at [facility], [place] [address] [place], [place] [address]      Studies: No results found.  Scheduled Meds: . amitriptyline  25 mg Oral QHS  . atorvastatin  10 mg Oral Daily  . calcium carbonate  1 tablet Oral TID WC  . insulin aspart  0-15 Units Subcutaneous TID WC  . insulin aspart  0-5 Units Subcutaneous QHS  . multivitamin with minerals  1 tablet Oral Daily  . olopatadine  1 drop Both Eyes Daily  . pantoprazole  40 mg Oral Daily  . polyethylene glycol  17 g Oral Once  . potassium chloride  20 mEq Oral BID  . pregabalin  300 mg Oral BID  . rivaroxaban  20 mg Oral Q supper  . vitamin B-12  1,000 mcg Oral Daily    Continuous Infusions:    LOS: 8 days   Total time spent: 25 minutes  Barb Merino, MD [facility]   02/16/2020, 3:48 PM

## 2020-02-17 DIAGNOSIS — K567 Ileus, unspecified: Secondary | ICD-10-CM

## 2020-02-17 LAB — CBC WITH DIFFERENTIAL/PLATELET
Abs Immature Granulocytes: 0.15 10*3/uL — ABNORMAL HIGH (ref 0.00–0.07)
Basophils Absolute: 0 10*3/uL (ref 0.0–0.1)
Basophils Relative: 1 %
Eosinophils Absolute: 0 10*3/uL (ref 0.0–0.5)
Eosinophils Relative: 0 %
HCT: 39.8 % (ref 39.0–52.0)
Hemoglobin: 13.5 g/dL (ref 13.0–17.0)
Immature Granulocytes: 3 %
Lymphocytes Relative: 24 %
Lymphs Abs: 1.4 10*3/uL (ref 0.7–4.0)
MCH: 31.3 pg (ref 26.0–34.0)
MCHC: 33.9 g/dL (ref 30.0–36.0)
MCV: 92.3 fL (ref 80.0–100.0)
Monocytes Absolute: 1 10*3/uL (ref 0.1–1.0)
Monocytes Relative: 16 %
Neutro Abs: 3.4 10*3/uL (ref 1.7–7.7)
Neutrophils Relative %: 56 %
Platelets: 213 10*3/uL (ref 150–400)
RBC: 4.31 MIL/uL (ref 4.22–5.81)
RDW: 14.1 % (ref 11.5–15.5)
WBC: 6 10*3/uL (ref 4.0–10.5)
nRBC: 0 % (ref 0.0–0.2)

## 2020-02-17 LAB — BASIC METABOLIC PANEL
Anion gap: 10 (ref 5–15)
BUN: 11 mg/dL (ref 8–23)
CO2: 23 mmol/L (ref 22–32)
Calcium: 9.7 mg/dL (ref 8.9–10.3)
Chloride: 103 mmol/L (ref 98–111)
Creatinine, Ser: 1.1 mg/dL (ref 0.61–1.24)
GFR calc Af Amer: >60 mL/min (ref >60)
GFR calc non Af Amer: >60 mL/min (ref >60)
Glucose, Bld: 248 mg/dL — ABNORMAL HIGH (ref 70–99)
Potassium: 4.6 mmol/L (ref 3.5–5.1)
Sodium: 136 mmol/L (ref 135–145)

## 2020-02-17 LAB — PHOSPHORUS: Phosphorus: 1.2 mg/dL — ABNORMAL LOW (ref 2.5–4.6)

## 2020-02-17 LAB — GLUCOSE, CAPILLARY
Glucose-Capillary: 226 mg/dL — ABNORMAL HIGH (ref 70–99)
Glucose-Capillary: 312 mg/dL — ABNORMAL HIGH (ref 70–99)
Glucose-Capillary: 271 mg/dL — ABNORMAL HIGH (ref 70–99)

## 2020-02-17 LAB — MAGNESIUM: Magnesium: 1.8 mg/dL (ref 1.7–2.4)

## 2020-02-17 MED ORDER — POTASSIUM & SODIUM PHOSPHATES 280-160-250 MG PO PACK: 1.0000 | PACK | Freq: Three times a day (TID) | ORAL | 0 refills | Status: AC

## 2020-02-17 MED ORDER — POTASSIUM PHOSPHATES 15 MMOLE/5ML IV SOLN
20.0000 mmol | Freq: Once | INTRAVENOUS | Status: AC
  Administered 2020-02-17: 20 mmol via INTRAVENOUS
  Filled 2020-02-17: qty 6.67

## 2020-02-17 NOTE — Progress Notes (Signed)
Occupational Therapy Treatment Patient Details Name: [name] MRN: [id number] DOB: September 05, 1941 Today's Date: 02/17/2020    History of present illness Pt is a 79 y/o male with hx of orthostatic hypotension, AAA repair, GERD, coronary artery disease, hyperlipidemia, hypertension, ischemic cardiomyopathy and diabetes, paroxysmal A. fib on Xarelto presented to the emergency room on 4/13 with dizziness and near passing out spells. EMS found SBP in 60s. Found with chronic cardiomegaly, CHF, acute colitis; 4/15 with abdominal distension and ileus, no obstruction.     OT comments  Pt making progress in therapy with session focusing on safety with self-care and functional transfer tasks. Pt engaged in seated LB dressing task with education provided concerning safety and energy conservation techniques. Pt required variable min to mod assist to maintain static standing balance while pulling up clothing over hips, noting significant retro lean and 1 instance of LOB. Pt able to ambulate to/from bathroom with RW and min guard with 0 instances of LOB. Educated/instructed pt on safety strategies and body positioning for toilet transfer with good understanding and follow through. Simulated walk-in shower transfer with pt utilizing grab bars and requiring min assist to ensure balance and safety. Pt tolerated standing ~2 min at the sink to complete hygiene tasks requiring cues for safe body positioning with RW. Pt setup in chair with all questions/concerns answered at this time. OT will continue to follow acutely.    Follow Up Recommendations  Supervision/Assistance - 24 hour;Home health OT    Equipment Recommendations  None recommended by OT    Recommendations for Other Services      Precautions / Restrictions Precautions Precautions: Fall Restrictions Weight Bearing Restrictions: No       Mobility Bed Mobility Overal bed mobility: Modified Independent Bed Mobility: Supine to Sit     Supine to sit:  Modified independent (Device/Increase time)     General bed mobility comments: HOB flat, use of bed rail  Transfers Overall transfer level: Needs assistance Equipment used: Rolling walker (2 wheeled) Transfers: Sit to/from Stand Sit to Stand: Min guard;From elevated surface         General transfer comment: Cues for safety. Retro lean in standing.     Balance Overall balance assessment: Needs assistance Sitting-balance support: No upper extremity supported;Feet supported Sitting balance-Leahy Scale: Fair       Standing balance-Leahy Scale: Fair                             ADL either performed or assessed with clinical judgement   ADL Overall ADL's : Needs assistance/impaired     Grooming: Min guard;Standing Grooming Details (indicate cue type and reason): While standing at the sink             Lower Body Dressing: Minimal assistance;Sit to/from stand;Moderate assistance Lower Body Dressing Details (indicate cue type and reason): Assist for balance in standing to pull up underwear, pants, and manage belt. 1 instance of retro LOB with pt unable to self-correct.   Toilet Transfer: Min guard;Ambulation;Comfort height toilet;Grab bars;RW Armed forces technical officer Details (indicate cue type and reason): Cues for hand placement and body positioning.  Toileting- Clothing Manipulation and Hygiene: Minimal assistance;Sit to/from stand Toileting - Clothing Manipulation Details (indicate cue type and reason): Assist for balance.  Tub/ Shower Transfer: Walk-in shower;Minimal assistance;Grab Paediatric nurse Details (indicate cue type and reason): Simulated in room. Assist for balance. Pt reports using a walk-in shower at home.  Functional mobility during ADLs:  Min guard;Rolling walker General ADL Comments: Pt able to ambulate to/from bathroom with RW and min guard. 0 instances of LOB.      Vision       Perception     Praxis      Cognition Arousal/Alertness:  Awake/alert Behavior During Therapy: WFL for tasks assessed/performed   Area of Impairment: Memory;Awareness;Problem solving                   Current Attention Level: Sustained Memory: Decreased short-term memory     Awareness: Emergent Problem Solving: Slow processing;Requires verbal cues General Comments: Pt pleasant and willing to participate in therapy. Slow processing requiring cues for problem solving.         Exercises     Shoulder Instructions       General Comments Pt's wife present for session    Pertinent Vitals/ Pain       Pain Assessment: No/denies pain  Home Living                                          Prior Functioning/Environment              Frequency           Progress Toward Goals  OT Goals(current goals can now be found in the care plan section)  Progress towards OT goals: Progressing toward goals  ADL Goals Pt Will Perform Grooming: with modified independence;standing Pt Will Perform Lower Body Dressing: with modified independence;sit to/from stand Pt Will Transfer to Toilet: with modified independence;ambulating Pt Will Perform Toileting - Clothing Manipulation and hygiene: with modified independence;sit to/from stand Pt Will Perform Tub/Shower Transfer: Shower transfer;with modified independence;shower seat;ambulating;rolling walker;grab bars  Plan Discharge plan needs to be updated    Co-evaluation                 AM-PAC OT "6 Clicks" Daily Activity     Outcome Measure   Help from another person eating meals?: None Help from another person taking care of personal grooming?: A Little Help from another person toileting, which includes using toliet, bedpan, or urinal?: A Little Help from another person bathing (including washing, rinsing, drying)?: A Little Help from another person to put on and taking off regular upper body clothing?: A Little Help from another person to put on and taking off  regular lower body clothing?: A Little 6 Click Score: 19    End of Session Equipment Utilized During Treatment: Gait belt;Rolling walker  OT Visit Diagnosis: Other abnormalities of gait and mobility (R26.89);Muscle weakness (generalized) (M62.81)   Activity Tolerance Patient tolerated treatment well   Patient Left in chair;with call bell/phone within reach   Nurse Communication Mobility status        Time: 1204-1228 OT Time Calculation (min): 24 min  Charges: OT General Charges $OT Visit: 1 Visit OT Treatments $Self Care/Home Management : 8-22 mins $Therapeutic Activity: 8-22 mins  Mauri Brooklyn OTR/L [phone]   Mauri Brooklyn 02/17/2020, 1:16 PM

## 2020-02-17 NOTE — Discharge Summary (Signed)
Physician Discharge Summary  [name]:[id number] DOB: 1940-12-13 DOA: 02/08/2020  PCP: [name], MD  Admit date: 02/08/2020 Discharge date: 02/17/2020  Admitted From: Home Disposition: Home  Recommendations for Outpatient Follow-up:  1. Follow up with PCP in 1-2 weeks 2. Please obtain BMP/magnesium/phosphorus level in 1 week.  Home Health: PT/OT Equipment/Devices: Patient has devices at home  Discharge Condition: Stable CODE STATUS: Full code Diet recommendation: Low-salt low-carb diet.  Easily digestible soft food.  Discharge summary:  Patient has history of orthostatic hypotension, AAA repair, previous history of prolonged ileus, GERD, coronary artery disease, hyperlipidemia, hypertension and ischemic cardiomyopathy, diabetes on oral hypoglycemics, paroxysmal A. fib on amiodarone and metoprolol along with Xarelto presented to the emergency room on 4/13 with dizziness while trying to get up and near passing out spell.  EMS was called, he was noted to have blood pressure of 60.  Patient also subsequently started having explosive diarrhea.  In the emergency room, he was afebrile.  Presentation blood pressure was 61/46 with heart rate of 86 that responded very well to IV fluid resuscitation.  Lactic acid was 2.9.  Rest of the test were unremarkable.  A CT scan of the abdomen pelvis showed focally thickened segment of distal descending colon and proximal sigmoid colon with surrounding pericolonic fatty stranding.  He was admitted with IV fluids and antibiotics.  Patient has prolonged hospitalization as he developed diarrhea followed by ileus.  Patient was treated for multiple conditions that are described as below.  Sepsis present on admission due to acute colitis: Initially presented with explosive diarrhea that was mixed with blood.  C diff and GI pathogen panel negative.  Treated with cefepime and Flagyl for 5 days.  Subsequently diarrhea improved and patient started having  distention of abdomen, a KUB showed hugely dilated stomach. KUB showed hugely dilated stomach with no bowel sound, NG tube was inserted and he drained 2 L gastric fluid at once.  He was maintained on NG tube drain for about 24 hours, NG tube was clamped for another 48 hours.  Slow return of bowel functions.  A repeat CT scan with oral and IV contrast showed resolution of ileus, no obstruction, no descending colon abnormalities that were seen on initial CT scan. With all improvement of symptoms, he was gradually advanced on diet and currently is tolerating soft diet and liquids with no abdominal symptoms, had a bowel movement and passing flatus. He has finished antibiotic therapy, however there was no bacterial infection on his stool studies. He did however had a consultation on his left lower lungs on the CT scan, he had cough and sputum production that has all improved now.  Vasovagal syncope: Due to dehydration and orthostasis.  All symptoms improved.  Able to tolerate ambulation with no change in orthostatic vitals.  Given significant orthostatic and dehydration on presentation, will continue his amiodarone that is used for rhythm control, he will continue to use metoprolol that is used for rate control.  Given his normal blood pressures, will discontinue ramipril at this time.  Electrolyte abnormalities: Multiple electrolyte abnormalities that were corrected.  His phosphorus was low on the day of discharge.  He has received high-dose IV phosphate before discharge.  We will also keep him on oral phosphorus for next 2 weeks.  He will need repeat BMP, magnesium and phosphorus checked within 1 week to ensure improvement and stabilization.  Coronary artery disease: Remains stable.  Paroxysmal A. fib: He is in sinus rhythm and rate controlled.  He will resume his amiodarone and metoprolol.  Xarelto is already resumed and is tolerating.  Kidney functions normalized.  Using CPAP at night for his sleep  apnea.  Today with adequate improvement.  After IV replacement of his electrolytes, he is willing to go home.  Patient is very deconditioned after 9 days of hospitalization with already present underlying ambulatory dysfunction, peripheral neuropathy and he will benefit with physical therapy and Occupational Therapy at home to strengthen, exercise and improve his balance and mobility.  Discharge Diagnoses:  Principal Problem:   Ileus ([place]) Active Problems:   Hypertension   Sleep apnea   DM (diabetes mellitus), type 2, uncontrolled w/neurologic complication (HCC)   Paroxysmal atrial fibrillation with RVR (HCC)   Coronary artery disease involving native coronary artery of native heart with angina pectoris (HCC)   Chronic combined systolic and diastolic heart failure ([place])   Long term current use of anticoagulant therapy   Hyperlipidemia   AKI (acute kidney injury) ([place])   Near syncope   Hypotension   Acute colitis    Discharge Instructions  Discharge Instructions    Call MD for:  persistant nausea and vomiting   Complete by: As directed    Diet - low sodium heart healthy   Complete by: As directed    Easily digestible food   Diet Carb Modified   Complete by: As directed    Discharge instructions   Complete by: As directed    Continue all your medications except ramipril until follow up.   Increase activity slowly   Complete by: As directed      Allergies as of 02/17/2020      Reactions   Cymbalta [duloxetine Hcl] Nausea And Vomiting, Other (See Comments)   PATIENT STATED THAT HE FELT LIKE HE "WAS GOING TO DIE" Rapid drop in blood pressure; sent him to the ER X2   Neurontin [gabapentin] Other (See Comments)   "Makes me goofy, keeps me off balance, clouds my thinking.."   Keppra [levetiracetam] Nausea And Vomiting      Medication List    STOP taking these medications   polyethylene glycol 17 g packet Commonly known as: MIRALAX / GLYCOLAX   ramipril 5 MG  capsule Commonly known as: ALTACE     TAKE these medications   amiodarone 200 MG tablet Commonly known as: PACERONE Take 1 tablet by mouth once daily   amitriptyline 25 MG tablet Commonly known as: ELAVIL Take 25 mg by mouth at bedtime.   atorvastatin 10 MG tablet Commonly known as: LIPITOR Take 10 mg by mouth daily.   Co Q 10 100 MG Caps Take 100 mg by mouth daily.   diazepam 5 MG tablet Commonly known as: VALIUM Take 5 mg by mouth at bedtime as needed for anxiety.   furosemide 40 MG tablet Commonly known as: LASIX Take 40 mg by mouth daily as needed for fluid.   glimepiride 1 MG tablet Commonly known as: AMARYL Take 1 mg by mouth daily with breakfast.   Jardiance 10 MG Tabs tablet Generic drug: empagliflozin Take 10 mg by mouth daily with breakfast.   metFORMIN 500 MG 24 hr tablet Commonly known as: GLUCOPHAGE-XR Take 500-1,000 mg by mouth See admin instructions. 1,000 mg in the morning and 500 mg in the evening   metoprolol tartrate 50 MG tablet Commonly known as: LOPRESSOR Take 1 tablet by mouth twice daily What changed: when to take this   omeprazole 20 MG capsule Commonly known as: PRILOSEC Take 20  mg by mouth daily.   One-A-Day Mens 50+ Advantage Tabs Take 1 tablet by mouth daily.   oxyCODONE-acetaminophen 5-325 MG tablet Commonly known as: PERCOCET/ROXICET Take 1 tablet by mouth every 6 (six) hours as needed (pain).   PATADAY OP Place 1 drop into both eyes daily.   potassium & sodium phosphates 280-160-250 MG Pack Commonly known as: PHOS-NAK Take 1 packet by mouth 4 (four) times daily -  with meals and at bedtime for 10 days.   potassium chloride 10 MEQ tablet Commonly known as: KLOR-CON Take 10 mEq by mouth daily.   pregabalin 300 MG capsule Commonly known as: LYRICA Take 1 capsule by mouth 2 (two) times a day.   trolamine salicylate 10 % cream Commonly known as: ASPERCREME Apply 1 application topically as needed for muscle pain.    vitamin B-12 1000 MCG tablet Commonly known as: CYANOCOBALAMIN Take 1,000 mcg by mouth daily.   Xarelto 20 MG Tabs tablet Generic drug: rivaroxaban Take 20 mg by mouth daily with supper.       Allergies  Allergen Reactions  . Cymbalta [Duloxetine Hcl] Nausea And Vomiting and Other (See Comments)    PATIENT STATED THAT HE FELT LIKE HE "WAS GOING TO DIE" Rapid drop in blood pressure; sent him to the ER X2  . Neurontin [Gabapentin] Other (See Comments)    "Makes me goofy, keeps me off balance, clouds my thinking.."  . Keppra [Levetiracetam] Nausea And Vomiting    Procedures/Studies: DG Abd 1 View  Result Date: 02/13/2020 CLINICAL DATA:  Abdominal distension. EXAM: ABDOMEN - 1 VIEW COMPARISON:  02/10/2020. Abdomen and pelvis CT dated 02/12/2020 FINDINGS: The nasogastric tube is no longer seen. Normal bowel gas pattern without the previously demonstrated gastric distension. Fluid oral contrast in the colon and distal small bowel. Lumbar and lower thoracic spine degenerative changes and previously described lower lumbar spine postoperative changes. Cholecystectomy clips. IMPRESSION: No acute abnormality.  Resolved gastric ileus. Electronically Signed   By: Claudie Revering M.D.   On: 02/13/2020 14:09   DG Abd 1 View  Result Date: 02/10/2020 CLINICAL DATA:  79 year old male with abdominal distension. EXAM: ABDOMEN - 1 VIEW COMPARISON:  CT Abdomen and Pelvis 03/09/2020. FINDINGS: Supine views of the abdomen. New severe gaseous distension of the stomach from the CT 2 days ago. There is superimposed gas and other nondilated bowel loops in the abdomen and pelvis. Stable cholecystectomy clips. No acute osseous abnormality identified. Previous lower lumbar decompression and fusion. Vascular calcifications in the abdomen and pelvis. IMPRESSION: Severe gaseous distension of the stomach is new from the recent CT Abdomen and Pelvis. Recommend NG tube decompression. Electronically Signed   By: Genevie Ann M.D.    On: 02/10/2020 13:32   CT ABDOMEN PELVIS W CONTRAST  Result Date: 02/12/2020 CLINICAL DATA:  Abdominal distension. Clinical suspicion for pseudo-obstruction. EXAM: CT ABDOMEN AND PELVIS WITH CONTRAST TECHNIQUE: Multidetector CT imaging of the abdomen and pelvis was performed using the standard protocol following bolus administration of intravenous contrast. CONTRAST:  124mL OMNIPAQUE IOHEXOL 300 MG/ML  SOLN COMPARISON:  Abdomen radiographs dated 02/10/2020. Abdomen and pelvis CT dated 02/08/2019 FINDINGS: Lower chest: Interval small bilateral pleural effusions, larger on the left. Associated left lower lobe compressive atelectasis and minimal right lower lobe compressive atelectasis. Mild cardiomegaly with a mild increase in size of the heart. Interval nasogastric tube with its tip in the distal esophagus. Hepatobiliary: No focal liver abnormality is seen. Status post cholecystectomy. No biliary dilatation. Pancreas: Unremarkable. No pancreatic ductal dilatation  or surrounding inflammatory changes. Spleen: Normal in size without focal abnormality. Adrenals/Urinary Tract: Normal appearing adrenal glands. Right renal cysts. 7 mm calculus in an area of cortical scarring in the lower pole of the left kidney. No bladder or ureteral calculi. The urinary bladder is moderately dilated. Stomach/Bowel: Dilated stomach with mild improvement since 02/10/2020. Unremarkable small bowel, colon and appendix. Vascular/Lymphatic: Atheromatous arterial calcifications without aneurysm. No enlarged lymph nodes. An aorto bi-iliac graft is in place the infrarenal abdominal aorta remains mildly dilated with a maximum AP diameter 3.7 mm, previously 3.9 cm in corresponding diameter. No enlarged lymph nodes. Reproductive: Mildly enlarged prostate gland. Other: No abdominal wall hernia or abnormality. No abdominopelvic ascites. Musculoskeletal: Lumbar and lower thoracic spine degenerative changes. Interbody and pedicle screw and rod fusion  at the L3 through L5 levels. IMPRESSION: 1. Dilated stomach with mild improvement since 02/10/2020. 2. Nasogastric tube tip in the distal esophagus. This needs to be advanced into the stomach. 3. Interval small bilateral pleural effusions, larger on the left. 4. Associated left lower lobe compressive atelectasis and minimal right lower lobe compressive atelectasis. 5. 7 mm nonobstructing left renal calculus in an area of cortical scarring. Electronically Signed   By: Claudie Revering M.D.   On: 02/12/2020 15:37   DG Chest Port 1 View  Result Date: 02/08/2020 CLINICAL DATA:  Weakness, hypotension EXAM: PORTABLE CHEST 1 VIEW COMPARISON:  Radiograph 09/30/2012 FINDINGS: Increasing hazy interstitial opacities within the lungs cardiomegaly and indistinct pulmonary vascularity may reflect mild interstitial edema. No consolidative opacity, pneumothorax or effusion. The aorta is calcified. The remaining cardiomediastinal contours are unremarkable. No acute osseous or soft tissue abnormality. Degenerative changes are present in the imaged spine and shoulders. Telemetry leads overlie the chest. IMPRESSION: Features compatible with CHF/volume overload with cardiomegaly and interstitial opacities favoring edema. Electronically Signed   By: Lovena Le M.D.   On: 02/08/2020 17:23   VAS [place] ABI WITH/WO TBI  Result Date: 02/15/2020 LOWER EXTREMITY DOPPLER STUDY Indications: Paresthesia. High Risk Factors: Hypertension, Diabetes.  Comparison Study: No prior studies. Performing Technologist: Carlos Levering RVT  Examination Guidelines: A complete evaluation includes at minimum, Doppler waveform signals and systolic blood pressure reading at the level of bilateral brachial, anterior tibial, and posterior tibial arteries, when vessel segments are accessible. Bilateral testing is considered an integral part of a complete examination. Photoelectric Plethysmograph (PPG) waveforms and toe systolic pressure readings are included as  required and additional duplex testing as needed. Limited examinations for reoccurring indications may be performed as noted.  ABI Findings: +---------+------------------+-----+---------+--------+ Right    Rt Pressure (mmHg)IndexWaveform Comment  +---------+------------------+-----+---------+--------+ Brachial 122                    triphasic         +---------+------------------+-----+---------+--------+ PTA      130               1.07 triphasic         +---------+------------------+-----+---------+--------+ DP       129               1.06 biphasic          +---------+------------------+-----+---------+--------+ Great Toe76                0.62                   +---------+------------------+-----+---------+--------+ +---------+------------------+-----+---------+-------+ Left     Lt Pressure (mmHg)IndexWaveform Comment +---------+------------------+-----+---------+-------+ Brachial 101  triphasic        +---------+------------------+-----+---------+-------+ PTA      136               1.11 triphasic        +---------+------------------+-----+---------+-------+ DP       119               0.98 triphasic        +---------+------------------+-----+---------+-------+ Great Toe88                0.72                  +---------+------------------+-----+---------+-------+ +-------+-----------+-----------+------------+------------+ ABI/TBIToday's ABIToday's TBIPrevious ABIPrevious TBI +-------+-----------+-----------+------------+------------+ Right  1.07       0.62                                +-------+-----------+-----------+------------+------------+ Left   1.11       0.72                                +-------+-----------+-----------+------------+------------+  Summary: Right: Resting right ankle-brachial index is within normal range. No evidence of significant right lower extremity arterial disease. The right toe-brachial  index is abnormal. Left: Resting left ankle-brachial index is within normal range. No evidence of significant left lower extremity arterial disease. The left toe-brachial index is normal.  *See table(s) above for measurements and observations.  Electronically signed by Harold Barban MD on 02/15/2020 at 4:59:51 PM.    Final    CT Renal Stone Study  Result Date: 02/08/2020 CLINICAL DATA:  Syncopal event EXAM: CT ABDOMEN AND PELVIS WITHOUT CONTRAST TECHNIQUE: Multidetector CT imaging of the abdomen and pelvis was performed following the standard protocol without IV contrast. COMPARISON:  Radiograph 07/08/2019, CT 07/04/2019 FINDINGS: Lower chest: There is a dense consolidative opacity in the left lung base with trace left pleural effusion and some surrounding atelectasis. The right lung base is predominantly clear aside from some basilar atelectatic changes as well. Normal heart size. No pericardial effusion. Extensive coronary artery calcifications. Hepatobiliary: No focal liver abnormality is seen. Patient is post cholecystectomy. Slight prominence of the biliary tree likely related to reservoir effect. No calcified intraductal gallstones. Pancreas: Unremarkable. No pancreatic ductal dilatation or surrounding inflammatory changes. Spleen: Normal in size without focal abnormality. Adrenals/Urinary Tract: Normal adrenal glands. Mild bilateral symmetric perinephric stranding, a nonspecific finding though may correlate with either age or decreased renal function. Asymmetric left renal atrophy with extensive areas of cortical scarring. Several fluid attenuation cysts are noted in the right kidney as well as a more intermediate partially exophytic 2.2 cm lesion arising from the posterior interpolar right kidney (3/45). Nonobstructing calculus present in the collecting system of the left lower pole. Additional vascular calcifications noted in both renal pelves. No hydronephrosis or obstructive urolithiasis. Mild bladder  distension without other gross abnormality. Stomach/Bowel: Distal esophagus, stomach and duodenal sweep are unremarkable. No small bowel wall thickening or dilatation. No evidence of obstruction. A normal appendix is visualized. Proximal colon is unremarkable. There is a focally thickened segment of the distal descending colon and proximal sigmoid with surrounding pericolonic fat stranding which does not appear centered upon a focal diverticulum. Vascular/Lymphatic: Extensive atherosclerotic calcification of the abdominal aorta and branch vessels. Slight fusiform ectasia of the infrarenal abdominal aorta is likely related to prior abdominal aortic aneurysm repair incompletely assessed on this exam examination in  the absence of intravenous contrast media. Some reactive adenopathy noted in the low pelvis and mesentery. Reproductive: Prostate at the upper limits of normal for size. No acute CT abnormality. Other: No bowel containing hernias. No abdominopelvic free air. Small volume free fluid in the pelvis, favor reactive given simple attenuation (-1 HU). Musculoskeletal: Prior L3-L5 posterior spinal decompression and fusion with interbody spacer placement. No evidence of acute hardware complication or failure. IMPRESSION: 1. Focally thickened segment of the distal descending colon and proximal sigmoid with surrounding pericolonic fat stranding which does not appear centered upon a focal diverticulum. Findings are favored to represent an acute infectious, inflammatory or vascular colitis. 2. Small volume free fluid in the pelvis, favor reactive given simple attenuation (-1 HU). 3. Dense consolidative opacity in the left lung base with trace left pleural effusion and some surrounding atelectasis. Findings are concerning for pneumonia. Recommend follow-up chest CT in 6-8 weeks following appropriate therapy to ensure resolution. 4. Asymmetric left renal atrophy with extensive areas of cortical scarring. Nonobstructing  left nephrolithiasis. 5. Extensive coronary artery calcifications. 6. Postsurgical changes of prior abdominal aortic aneurysm repair incompletely assessed on this exam in the absence of intravenous contrast media. 7. Prior L3-L5 posterior spinal decompression and fusion with interbody spacer placement. No evidence of acute hardware complication or failure. 8. Aortic Atherosclerosis (ICD10-I70.0). Electronically Signed   By: Lovena Le M.D.   On: 02/08/2020 20:27     Subjective: Patient seen and examined.  No complaints.  Eating regular diet.  He had a bowel movement last night.  Denies any abdominal pain nausea vomiting.  He was eager to go home in the morning. We discussed about IV replacement of his electrolytes before discharge.  He will be going home today after receiving IV phosphate.   Discharge Exam: Vitals:   02/16/20 2153 02/17/20 0528  BP: 125/81 115/80  Pulse: 98 99  Resp: 17 15  Temp: 98.3 F (36.8 C) 98.1 F (36.7 C)  SpO2: 100% 93%   Vitals:   02/16/20 1539 02/16/20 2028 02/16/20 2153 02/17/20 0528  BP: 110/68  125/81 115/80  Pulse: 81 91 98 99  Resp: 16 18 17 15   Temp: 98.9 F (37.2 C)  98.3 F (36.8 C) 98.1 F (36.7 C)  TempSrc: Oral  Oral Oral  SpO2:  96% 100% 93%  Weight:      Height:        General: Pt is alert, awake, not in acute distress, wife at the bedside.  He is on room air.  He was working with occupational therapy. Cardiovascular: RRR, S1/S2 +, no rubs, no gallops Respiratory: CTA bilaterally, no wheezing, no rhonchi Abdominal: Soft, NT, ND, bowel sounds + Extremities: no edema, no cyanosis He has good peripheral pulses.  Has painful feet with peripheral neuropathy.    The results of significant diagnostics from this hospitalization (including imaging, microbiology, ancillary and laboratory) are listed below for reference.     Microbiology: Recent Results (from the past 240 hour(s))  Respiratory Panel by RT PCR (Flu A&B, Covid) -  Nasopharyngeal Swab     Status: None   Collection Time: 02/08/20  4:52 PM   Specimen: Nasopharyngeal Swab  Result Value Ref Range Status   SARS Coronavirus 2 by RT PCR NEGATIVE NEGATIVE Final    Comment: (NOTE) SARS-CoV-2 target nucleic acids are NOT DETECTED. The SARS-CoV-2 RNA is generally detectable in upper respiratoy specimens during the acute phase of infection. The lowest concentration of SARS-CoV-2 viral copies this assay can  detect is 131 copies/mL. A negative result does not preclude SARS-Cov-2 infection and should not be used as the sole basis for treatment or other patient management decisions. A negative result may occur with  improper specimen collection/handling, submission of specimen other than nasopharyngeal swab, presence of viral mutation(s) within the areas targeted by this assay, and inadequate number of viral copies (<131 copies/mL). A negative result must be combined with clinical observations, patient history, and epidemiological information. The expected result is Negative. Fact Sheet for Patients:  PinkCheek.be Fact Sheet for Healthcare Providers:  GravelBags.it This test is not yet ap proved or cleared by the [place] FDA and  has been authorized for detection and/or diagnosis of SARS-CoV-2 by FDA under an Emergency Use Authorization (EUA). This EUA will remain  in effect (meaning this test can be used) for the duration of the COVID-19 declaration under Section 564(b)(1) of the Act, 21 U.S.C. section 360bbb-3(b)(1), unless the authorization is terminated or revoked sooner.    Influenza A by PCR NEGATIVE NEGATIVE Final   Influenza B by PCR NEGATIVE NEGATIVE Final    Comment: (NOTE) The Xpert Xpress SARS-CoV-2/FLU/RSV assay is intended as an aid in  the diagnosis of influenza from Nasopharyngeal swab specimens and  should not be used as a sole basis for treatment. Nasal washings and  aspirates  are unacceptable for Xpert Xpress SARS-CoV-2/FLU/RSV  testing. Fact Sheet for Patients: PinkCheek.be Fact Sheet for Healthcare Providers: GravelBags.it This test is not yet approved or cleared by the [place] FDA and  has been authorized for detection and/or diagnosis of SARS-CoV-2 by  FDA under an Emergency Use Authorization (EUA). This EUA will remain  in effect (meaning this test can be used) for the duration of the  Covid-19 declaration under Section 564(b)(1) of the Act, 21  U.S.C. section 360bbb-3(b)(1), unless the authorization is  terminated or revoked. Performed at [facility], [place] [address] [place], [place] [address]   Blood Culture (routine x 2)     Status: None   Collection Time: 02/08/20 10:48 PM   Specimen: BLOOD  Result Value Ref Range Status   Specimen Description BLOOD RIGHT ARM  Final   Special Requests   Final    BOTTLES DRAWN AEROBIC AND ANAEROBIC Blood Culture adequate volume   Culture   Final    NO GROWTH 5 DAYS Performed at [facility], [place] [address] [address] [place], [place] [address]    Report Status 02/14/2020 FINAL  Final  Blood Culture (routine x 2)     Status: None   Collection Time: 02/08/20 11:08 PM   Specimen: BLOOD  Result Value Ref Range Status   Specimen Description BLOOD RIGHT FOREARM  Final   Special Requests   Final    BOTTLES DRAWN AEROBIC AND ANAEROBIC Blood Culture adequate volume   Culture   Final    NO GROWTH 5 DAYS Performed at [facility], [place] [address], [place], [place] [address]    Report Status 02/14/2020 FINAL  Final  Gastrointestinal Panel by PCR , Stool     Status: None   Collection Time: 02/09/20  4:26 AM   Specimen: Stool  Result Value Ref Range Status   Campylobacter species NOT DETECTED NOT DETECTED Final   Plesimonas shigelloides NOT DETECTED NOT DETECTED Final   Salmonella species NOT DETECTED NOT DETECTED Final   Yersinia  enterocolitica NOT DETECTED NOT DETECTED Final   Vibrio species NOT DETECTED NOT DETECTED Final   Vibrio cholerae  NOT DETECTED NOT DETECTED Final   Enteroaggregative E coli (EAEC) NOT DETECTED NOT DETECTED Final   Enteropathogenic E coli (EPEC) NOT DETECTED NOT DETECTED Final   Enterotoxigenic E coli (ETEC) NOT DETECTED NOT DETECTED Final   Shiga like toxin producing E coli (STEC) NOT DETECTED NOT DETECTED Final   Shigella/Enteroinvasive E coli (EIEC) NOT DETECTED NOT DETECTED Final   Cryptosporidium NOT DETECTED NOT DETECTED Final   Cyclospora cayetanensis NOT DETECTED NOT DETECTED Final   Entamoeba histolytica NOT DETECTED NOT DETECTED Final   Giardia lamblia NOT DETECTED NOT DETECTED Final   Adenovirus F40/41 NOT DETECTED NOT DETECTED Final   Astrovirus NOT DETECTED NOT DETECTED Final   Norovirus GI/GII NOT DETECTED NOT DETECTED Final   Rotavirus A NOT DETECTED NOT DETECTED Final   Sapovirus (I, II, IV, and V) NOT DETECTED NOT DETECTED Final    Comment: Performed at [facility], [address] [place], [place] [address]  C Difficile Quick Screen w PCR reflex     Status: None   Collection Time: 02/09/20  4:27 AM   Specimen: Stool  Result Value Ref Range Status   C Diff antigen NEGATIVE NEGATIVE Final   C Diff toxin NEGATIVE NEGATIVE Final   C Diff interpretation No C. difficile detected.  Final    Comment: Performed at [facility], [place] [address], [place],  [address]     Labs: BNP (last 3 results) Recent Labs    07/05/19 0655 02/13/20 0406  BNP 222.0* 294.7*   Basic Metabolic Panel: Recent Labs  Lab 02/11/20 0244 02/12/20 0603 02/13/20 0406 02/14/20 0314 02/17/20 0134  NA 140 140 138 138 136  K 3.7 3.5 3.4* 3.4* 4.6  CL 110 110 110 107 103  CO2 20* 20* 16* 17* 23  GLUCOSE 107* 106* 80 151* 248*  BUN 20 13 9 9 11   CREATININE 1.04 0.95 0.96 1.04 1.10  CALCIUM 8.5* 8.3* 8.1* 8.6* 9.7  MG 1.8 1.7  --   --  1.8  PHOS 1.8* 2.1*  --   --  1.2*    Liver Function Tests: Recent Labs  Lab 02/11/20 0244  AST 20  ALT 23  ALKPHOS 49  BILITOT 0.9  PROT 5.9*  ALBUMIN 2.4*   No results for input(s): LIPASE, AMYLASE in the last 168 hours. No results for input(s): AMMONIA in the last 168 hours. CBC: Recent Labs  Lab 02/11/20 0244 02/12/20 0603 02/13/20 0406 02/17/20 0134  WBC 9.3 6.6 5.5 6.0  NEUTROABS 6.9 4.6 3.6 3.4  HGB 11.9* 11.6* 11.8* 13.5  HCT 36.2* 34.5* 35.2* 39.8  MCV 94.5 93.5 92.1 92.3  PLT 175 168 180 213   Cardiac Enzymes: No results for input(s): CKTOTAL, CKMB, CKMBINDEX, TROPONINI in the last 168 hours. BNP: Invalid input(s): POCBNP CBG: Recent Labs  Lab 02/16/20 1148 02/16/20 1709 02/16/20 2048 02/17/20 0809 02/17/20 1226  GLUCAP 258* 219* 223* 226* 312*   D-Dimer No results for input(s): DDIMER in the last 72 hours. Hgb A1c No results for input(s): HGBA1C in the last 72 hours. Lipid Profile No results for input(s): CHOL, HDL, LDLCALC, TRIG, CHOLHDL, LDLDIRECT in the last 72 hours. Thyroid function studies No results for input(s): TSH, T4TOTAL, T3FREE, THYROIDAB in the last 72 hours.  Invalid input(s): FREET3 Anemia work up No results for input(s): VITAMINB12, FOLATE, FERRITIN, TIBC, IRON, RETICCTPCT in the last 72 hours. Urinalysis    Component Value Date/Time   COLORURINE YELLOW 02/08/2020 [place] 02/08/2020  1624   LABSPEC 1.011 02/08/2020 1624   PHURINE 6.0 02/08/2020 1624   GLUCOSEU >=500 (A) 02/08/2020 1624   HGBUR LARGE (A) 02/08/2020 1624   BILIRUBINUR NEGATIVE 02/08/2020 1624   KETONESUR NEGATIVE 02/08/2020 1624   PROTEINUR NEGATIVE 02/08/2020 1624   UROBILINOGEN 0.2 12/12/2014 0150   NITRITE NEGATIVE 02/08/2020 1624   LEUKOCYTESUR NEGATIVE 02/08/2020 1624   Sepsis Labs Invalid input(s): PROCALCITONIN,  WBC,  LACTICIDVEN Microbiology Recent Results (from the past 240 hour(s))  Respiratory Panel by RT PCR (Flu A&B, Covid) - Nasopharyngeal Swab      Status: None   Collection Time: 02/08/20  4:52 PM   Specimen: Nasopharyngeal Swab  Result Value Ref Range Status   SARS Coronavirus 2 by RT PCR NEGATIVE NEGATIVE Final    Comment: (NOTE) SARS-CoV-2 target nucleic acids are NOT DETECTED. The SARS-CoV-2 RNA is generally detectable in upper respiratoy specimens during the acute phase of infection. The lowest concentration of SARS-CoV-2 viral copies this assay can detect is 131 copies/mL. A negative result does not preclude SARS-Cov-2 infection and should not be used as the sole basis for treatment or other patient management decisions. A negative result may occur with  improper specimen collection/handling, submission of specimen other than nasopharyngeal swab, presence of viral mutation(s) within the areas targeted by this assay, and inadequate number of viral copies (<131 copies/mL). A negative result must be combined with clinical observations, patient history, and epidemiological information. The expected result is Negative. Fact Sheet for Patients:  PinkCheek.be Fact Sheet for Healthcare Providers:  GravelBags.it This test is not yet ap proved or cleared by the [place] FDA and  has been authorized for detection and/or diagnosis of SARS-CoV-2 by FDA under an Emergency Use Authorization (EUA). This EUA will remain  in effect (meaning this test can be used) for the duration of the COVID-19 declaration under Section 564(b)(1) of the Act, 21 U.S.C. section 360bbb-3(b)(1), unless the authorization is terminated or revoked sooner.    Influenza A by PCR NEGATIVE NEGATIVE Final   Influenza B by PCR NEGATIVE NEGATIVE Final    Comment: (NOTE) The Xpert Xpress SARS-CoV-2/FLU/RSV assay is intended as an aid in  the diagnosis of influenza from Nasopharyngeal swab specimens and  should not be used as a sole basis for treatment. Nasal washings and  aspirates are unacceptable for  Xpert Xpress SARS-CoV-2/FLU/RSV  testing. Fact Sheet for Patients: PinkCheek.be Fact Sheet for Healthcare Providers: GravelBags.it This test is not yet approved or cleared by the [place] FDA and  has been authorized for detection and/or diagnosis of SARS-CoV-2 by  FDA under an Emergency Use Authorization (EUA). This EUA will remain  in effect (meaning this test can be used) for the duration of the  Covid-19 declaration under Section 564(b)(1) of the Act, 21  U.S.C. section 360bbb-3(b)(1), unless the authorization is  terminated or revoked. Performed at [facility], [place] [address], [place], [place] [address]   Blood Culture (routine x 2)     Status: None   Collection Time: 02/08/20 10:48 PM   Specimen: BLOOD  Result Value Ref Range Status   Specimen Description BLOOD RIGHT ARM  Final   Special Requests   Final    BOTTLES DRAWN AEROBIC AND ANAEROBIC Blood Culture adequate volume   Culture   Final    NO GROWTH 5 DAYS Performed at [facility], [place] [address], [place],  [address]    Report Status 02/14/2020 FINAL  Final  Blood Culture (routine  x 2)     Status: None   Collection Time: 02/08/20 11:08 PM   Specimen: BLOOD  Result Value Ref Range Status   Specimen Description BLOOD RIGHT FOREARM  Final   Special Requests   Final    BOTTLES DRAWN AEROBIC AND ANAEROBIC Blood Culture adequate volume   Culture   Final    NO GROWTH 5 DAYS Performed at [facility], [address] [place], [place] [address]    Report Status 02/14/2020 FINAL  Final  Gastrointestinal Panel by PCR , Stool     Status: None   Collection Time: 02/09/20  4:26 AM   Specimen: Stool  Result Value Ref Range Status   Campylobacter species NOT DETECTED NOT DETECTED Final   Plesimonas shigelloides NOT DETECTED NOT DETECTED Final   Salmonella species NOT DETECTED NOT DETECTED Final   Yersinia enterocolitica NOT DETECTED NOT  DETECTED Final   Vibrio species NOT DETECTED NOT DETECTED Final   Vibrio cholerae NOT DETECTED NOT DETECTED Final   Enteroaggregative E coli (EAEC) NOT DETECTED NOT DETECTED Final   Enteropathogenic E coli (EPEC) NOT DETECTED NOT DETECTED Final   Enterotoxigenic E coli (ETEC) NOT DETECTED NOT DETECTED Final   Shiga like toxin producing E coli (STEC) NOT DETECTED NOT DETECTED Final   Shigella/Enteroinvasive E coli (EIEC) NOT DETECTED NOT DETECTED Final   Cryptosporidium NOT DETECTED NOT DETECTED Final   Cyclospora cayetanensis NOT DETECTED NOT DETECTED Final   Entamoeba histolytica NOT DETECTED NOT DETECTED Final   Giardia lamblia NOT DETECTED NOT DETECTED Final   Adenovirus F40/41 NOT DETECTED NOT DETECTED Final   Astrovirus NOT DETECTED NOT DETECTED Final   Norovirus GI/GII NOT DETECTED NOT DETECTED Final   Rotavirus A NOT DETECTED NOT DETECTED Final   Sapovirus (I, II, IV, and V) NOT DETECTED NOT DETECTED Final    Comment: Performed at [facility], [address] [place], [place] [address]  C Difficile Quick Screen w PCR reflex     Status: None   Collection Time: 02/09/20  4:27 AM   Specimen: Stool  Result Value Ref Range Status   C Diff antigen NEGATIVE NEGATIVE Final   C Diff toxin NEGATIVE NEGATIVE Final   C Diff interpretation No C. difficile detected.  Final    Comment: Performed at [facility], [place] [address], [place], [place] [address]     Time coordinating discharge:  40 minutes  SIGNED:   Barb Merino, MD  [facility] 02/17/2020, 4:16 PM

## 2020-02-17 NOTE — Progress Notes (Signed)
Physical Therapy Treatment Patient Details Name: [name] MRN: [id number] DOB: 1941/01/03 Today's Date: 02/17/2020    History of Present Illness Pt is a 79 y/o male with hx of orthostatic hypotension, AAA repair, GERD, coronary artery disease, hyperlipidemia, hypertension, ischemic cardiomyopathy and diabetes, paroxysmal A. fib on Xarelto presented to the emergency room on 4/13 with dizziness and near passing out spells. EMS found SBP in 60s. Found with chronic cardiomegaly, CHF, acute colitis; 4/15 with abdominal distension and ileus, no obstruction.     PT Comments    Pt progressing well with mobility. Able to tolerate gait training with RW and stair training with min guard for balance. Wife present and supportive. Pt preparing for d/c home this evening. Reviewed educ re: fall risk reduction, activity recommendations.    Follow Up Recommendations  Home health PT;Supervision for mobility/OOB     Equipment Recommendations  None recommended by PT    Recommendations for Other Services       Precautions / Restrictions Precautions Precautions: Fall Restrictions Weight Bearing Restrictions: No    Mobility  Bed Mobility               General bed mobility comments: Received sitting in recliner  Transfers Overall transfer level: Needs assistance Equipment used: Rolling walker (2 wheeled) Transfers: Sit to/from Stand Sit to Stand: Supervision            Ambulation/Gait Ambulation/Gait assistance: Min guard Gait Distance (Feet): 340 Feet Assistive device: Rolling walker (2 wheeled) Gait Pattern/deviations: Decreased dorsiflexion - right;Decreased dorsiflexion - left;Step-through pattern;Trunk flexed Gait velocity: Decreased Gait velocity interpretation: 1.31 - 2.62 ft/sec, indicative of limited community ambulator General Gait Details: Slow, mildly unsteady gait with RW and intermittent min guard for balance; decreased step height bilaterally, decreased wt shift  during gait   Stairs Stairs: Yes Stairs assistance: Min guard Stair Management: One rail Left;Forwards Number of Stairs: 2 General stair comments: Ascended/descended 2 steps with BUE support on L-side rail; min guard for balance   Wheelchair Mobility    Modified Rankin (Stroke Patients Only)       Balance Overall balance assessment: Needs assistance Sitting-balance support: No upper extremity supported;Feet supported Sitting balance-Leahy Scale: Fair     Standing balance support: During functional activity;No upper extremity supported;Bilateral upper extremity supported Standing balance-Leahy Scale: Fair Standing balance comment: Can static stand without UE support; reliant on UE support dynamically                            Cognition Arousal/Alertness: Awake/alert Behavior During Therapy: WFL for tasks assessed/performed Overall Cognitive Status: Impaired/Different from baseline Area of Impairment: Memory;Awareness;Problem solving;Attention;Following commands                   Current Attention Level: Sustained;Selective Memory: Decreased short-term memory Following Commands: Follows multi-step commands inconsistently   Awareness: Emergent Problem Solving: Slow processing;Requires verbal cues General Comments: Pt pleasant and willing to participate in therapy. Slow processing requiring cues for problem solving.       Exercises      General Comments General comments (skin integrity, edema, etc.): Wife present; pt preparing for d/c home this evening      Pertinent Vitals/Pain Pain Assessment: No/denies pain Pain Intervention(s): Monitored during session    Home Living                      Prior Function  PT Goals (current goals can now be found in the care plan section) Progress towards PT goals: Progressing toward goals    Frequency    Min 3X/week      PT Plan Discharge plan needs to be updated     Co-evaluation              AM-PAC PT "6 Clicks" Mobility   Outcome Measure  Help needed turning from your back to your side while in a flat bed without using bedrails?: None Help needed moving from lying on your back to sitting on the side of a flat bed without using bedrails?: None Help needed moving to and from a bed to a chair (including a wheelchair)?: A Little Help needed standing up from a chair using your arms (e.g., wheelchair or bedside chair)?: A Little Help needed to walk in hospital room?: A Little Help needed climbing 3-5 steps with a railing? : A Little 6 Click Score: 20    End of Session Equipment Utilized During Treatment: Gait belt Activity Tolerance: Patient tolerated treatment well Patient left: in chair;with call bell/phone within reach;with family/visitor present Nurse Communication: Mobility status PT Visit Diagnosis: Unsteadiness on feet (R26.81);History of falling (Z91.81);Difficulty in walking, not elsewhere classified (R26.2)     Time: 5409-8119 PT Time Calculation (min) (ACUTE ONLY): 17 min  Charges:  $Gait Training: 8-22 mins                    Mabeline Caras, PT, DPT Acute Rehabilitation Services  Pager [phone] Office [place] 02/17/2020, 5:01 PM

## 2020-03-31 ENCOUNTER — Other Ambulatory Visit: Payer: Self-pay | Admitting: Interventional Cardiology

## 2020-06-29 ENCOUNTER — Other Ambulatory Visit: Payer: Self-pay | Admitting: Interventional Cardiology

## 2020-07-19 ENCOUNTER — Emergency Department (HOSPITAL_BASED_OUTPATIENT_CLINIC_OR_DEPARTMENT_OTHER): Payer: Medicare Other

## 2020-07-19 ENCOUNTER — Encounter (HOSPITAL_BASED_OUTPATIENT_CLINIC_OR_DEPARTMENT_OTHER): Payer: Self-pay | Admitting: *Deleted

## 2020-07-19 ENCOUNTER — Other Ambulatory Visit: Payer: Self-pay

## 2020-07-19 ENCOUNTER — Emergency Department (HOSPITAL_BASED_OUTPATIENT_CLINIC_OR_DEPARTMENT_OTHER)
Admission: EM | Admit: 2020-07-19 | Discharge: 2020-07-19 | Disposition: A | Discharge status: Home / Self Care | Payer: Medicare Other | Attending: Emergency Medicine | Admitting: Emergency Medicine

## 2020-07-19 DIAGNOSIS — S93111A Dislocation of interphalangeal joint of right great toe, initial encounter: Secondary | ICD-10-CM | POA: Insufficient documentation

## 2020-07-19 DIAGNOSIS — Z87891 Personal history of nicotine dependence: Secondary | ICD-10-CM | POA: Insufficient documentation

## 2020-07-19 DIAGNOSIS — E119 Type 2 diabetes mellitus without complications: Secondary | ICD-10-CM | POA: Diagnosis not present

## 2020-07-19 DIAGNOSIS — Z7901 Long term (current) use of anticoagulants: Secondary | ICD-10-CM | POA: Diagnosis not present

## 2020-07-19 DIAGNOSIS — S91111A Laceration without foreign body of right great toe without damage to nail, initial encounter: Secondary | ICD-10-CM | POA: Diagnosis not present

## 2020-07-19 DIAGNOSIS — I2510 Atherosclerotic heart disease of native coronary artery without angina pectoris: Secondary | ICD-10-CM | POA: Diagnosis not present

## 2020-07-19 DIAGNOSIS — Z7984 Long term (current) use of oral hypoglycemic drugs: Secondary | ICD-10-CM | POA: Insufficient documentation

## 2020-07-19 DIAGNOSIS — I5042 Chronic combined systolic (congestive) and diastolic (congestive) heart failure: Secondary | ICD-10-CM | POA: Diagnosis not present

## 2020-07-19 DIAGNOSIS — Y9301 Activity, walking, marching and hiking: Secondary | ICD-10-CM | POA: Insufficient documentation

## 2020-07-19 DIAGNOSIS — W19XXXA Unspecified fall, initial encounter: Secondary | ICD-10-CM | POA: Diagnosis not present

## 2020-07-19 DIAGNOSIS — I110 Hypertensive heart disease with heart failure: Secondary | ICD-10-CM | POA: Insufficient documentation

## 2020-07-19 DIAGNOSIS — S90931A Unspecified superficial injury of right great toe, initial encounter: Secondary | ICD-10-CM | POA: Diagnosis present

## 2020-07-19 DIAGNOSIS — S93104A Unspecified dislocation of right toe(s), initial encounter: Secondary | ICD-10-CM

## 2020-07-19 DIAGNOSIS — Z79899 Other long term (current) drug therapy: Secondary | ICD-10-CM | POA: Diagnosis not present

## 2020-07-19 MED ORDER — LIDOCAINE HCL (PF) 1 % IJ SOLN
5.0000 mL | Freq: Once | INTRAMUSCULAR | Status: AC
  Administered 2020-07-19: 5 mL
  Filled 2020-07-19: qty 5

## 2020-07-19 MED ORDER — CEPHALEXIN 500 MG PO CAPS: 500.0000 mg | ORAL_CAPSULE | Freq: Three times a day (TID) | ORAL | 0 refills | Status: AC

## 2020-07-19 NOTE — ED Triage Notes (Signed)
Fell this morning.  Walking with his walker and suddenly both his knees just give out.  Injury to right foot.

## 2020-07-19 NOTE — Progress Notes (Signed)
Cardiology Office Note:    Date:  07/20/2020   ID:  BOWEN KIA, DOB 01/03/1941, MRN [id number]  PCP:  [name], MD  Cardiologist:  Sinclair Grooms, MD   Referring MD: [name], MD   Chief Complaint  Patient presents with  . Coronary Artery Disease  . Atrial Fibrillation    History of Present Illness:    [name] is a 79 y.o. male with a hx of coronary artery disease, chronic combined systolic and diastolic heart failure, prior circumflex, LAD stenting, diabetes mellitus, paroxysmal atrial fibrillation, chronic anticoagulation, and history of severe polyneuropathy involving the lower extremities  Overall, [name] is doing well.  He is becoming increasingly prone to falls and injury related to balance and neuropathy.  He also has lower extremity weakness.  He is wearing a boot on his right foot after injuring the right great toe yesterday requiring an emergency room visit.  The great toe required sutures.  Relative to his heart, he denies angina, shortness of breath, peripheral edema, orthopnea, palpitations, and syncope.  Past Medical History:  Diagnosis Date  . AAA (abdominal aortic aneurysm) ([place])    a. s/p repair 2009.  [name] Acid reflux    takes Prilosec and Protonix daily  . Arthritis    BacK  . CAD in native artery    a. a. prior inf MI 1993. b. PCI to LAD 05/1997. c. recurrent inferolateral MI complicated by V. fib arrest in 04/1998, prior PCI to OM1. d. 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  [name] Chronic combined systolic and diastolic CHF (congestive heart failure) ([place])   . Complication of anesthesia    -years ago hair fell out.;ileus after 2 of his surgeries  . Dry skin   . Foot drop, bilateral   . Hypercholesteremia   . Hypertension   . Ileus ([place])    After AAA  . Incisional hernia    "small, from AAA"  . Ischemic cardiomyopathy   . MI (myocardial infarction) ([place])   . 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 OSA on CPAP    uses CPAP--sleep study done at least  21yrs ago  . PAF (paroxysmal atrial fibrillation) ([place])   . Pain    back pain chronic- seen at pain clinic  . Plantar fasciitis    bilatetral  . Thrombocytopenia ([place])   . Type II diabetes mellitus ([place])     Past Surgical History:  Procedure Laterality Date  . ABDOMINAL AORTIC ANEURYSM REPAIR    . BACK SURGERY  2012-2013 X 3   Miminal Invasive x 3in [place].  [name] CARDIAC CATHETERIZATION  2009/2012  . CARDIOVERSION N/A 04/12/2015   Procedure: CARDIOVERSION;  Surgeon: Sueanne Margarita, MD;  Location: [facility];  Service: Cardiovascular;  Laterality: N/A;  . CARDIOVERSION N/A 12/26/2016   Procedure: CARDIOVERSION;  Surgeon: Sueanne Margarita, MD;  Location: [facility];  Service: Cardiovascular;  Laterality: N/A;  . COLONOSCOPY    . CORONARY ANGIOPLASTY WITH STENT PLACEMENT  1992; 1997  . LAPAROSCOPIC CHOLECYSTECTOMY    . LUMBAR LAMINECTOMY/DECOMPRESSION MICRODISCECTOMY Right 01/16/2016   Procedure: Right Lumbar five-Sacral one Laminectomy;  Surgeon: Kristeen Miss, MD;  Location: [facility];  Service: Neurosurgery;  Laterality: Right;  Right L5-S1 Laminectomy    Current Medications: Current Meds  Medication Sig  . amiodarone (PACERONE) 200 MG tablet Take 1 tablet (200 mg total) by mouth daily.  [name] amitriptyline (ELAVIL) 25 MG tablet Take 25 mg by mouth at  bedtime.   [name] atorvastatin (LIPITOR) 10 MG tablet Take 10 mg by mouth daily.  . cephALEXin (KEFLEX) 500 MG capsule Take 1 capsule (500 mg total) by mouth 3 (three) times daily for 7 days.  . Coenzyme Q10 (CO Q 10) 100 MG CAPS Take 100 mg by mouth daily.  . diazepam (VALIUM) 5 MG tablet Take 5 mg by mouth at bedtime as needed for anxiety.   . empagliflozin (JARDIANCE) 10 MG TABS tablet Take 10 mg by mouth daily with breakfast.   . furosemide (LASIX) 40 MG tablet Take 40 mg by mouth daily as needed for fluid.   [name] glimepiride (AMARYL) 1 MG tablet Take 1 mg by mouth daily with breakfast.  . metFORMIN (GLUCOPHAGE-XR) 500 MG 24 hr tablet Take  500-1,000 mg by mouth See admin instructions. 1,000 mg in the morning and 500 mg in the evening  . metoprolol tartrate (LOPRESSOR) 50 MG tablet Take 1 tablet by mouth twice daily  . Multiple Vitamins-Minerals (ONE-A-DAY MENS 50+ ADVANTAGE) TABS Take 1 tablet by mouth daily.  . Olopatadine HCl (PATADAY OP) Place 1 drop into both eyes daily.  [name] omeprazole (PRILOSEC) 20 MG capsule Take 20 mg by mouth daily.  [name] oxyCODONE-acetaminophen (PERCOCET/ROXICET) 5-325 MG tablet Take 1 tablet by mouth every 6 (six) hours as needed (pain).   . pregabalin (LYRICA) 300 MG capsule Take 1 capsule by mouth 2 (two) times a day.  . rivaroxaban (XARELTO) 20 MG TABS tablet Take 20 mg by mouth daily with supper.  . trolamine salicylate (ASPERCREME) 10 % cream Apply 1 application topically as needed for muscle pain.  . vitamin B-12 (CYANOCOBALAMIN) 1000 MCG tablet Take 1,000 mcg by mouth daily.      Allergies:   Cymbalta [duloxetine hcl], Neurontin [gabapentin], and Keppra [levetiracetam]   Social History   Socioeconomic History  . Marital status: Married    Spouse name: Not on file  . Number of children: Not on file  . Years of education: Not on file  . Highest education level: Not on file  Occupational History  . Not on file  Tobacco Use  . Smoking status: Former Smoker    Packs/day: 1.00    Years: 33.00    Pack years: 33.00    Types: Cigarettes    Quit date: 01/01/1992    Years since quitting: 28.5  . Smokeless tobacco: Never Used  Vaping Use  . Vaping Use: Never used  Substance and Sexual Activity  . Alcohol use: Yes    Comment: 6/30;2017 "GLASS OF WINE Q COUPLE MONTHS, IF THAT"  . Drug use: No  . Sexual activity: Yes  Other Topics Concern  . Not on file  Social History Narrative  . Not on file   Social Determinants of Health   Financial Resource Strain:   . Difficulty of Paying Living Expenses: Not on file  Food Insecurity:   . Worried About Charity fundraiser in the Last Year: Not on  file  . Ran Out of Food in the Last Year: Not on file  Transportation Needs:   . Lack of Transportation (Medical): Not on file  . Lack of Transportation (Non-Medical): Not on file  Physical Activity:   . Days of Exercise per Week: Not on file  . Minutes of Exercise per Session: Not on file  Stress:   . Feeling of Stress : Not on file  Social Connections:   . Frequency of Communication with Friends and Family: Not on file  .  Frequency of Social Gatherings with Friends and Family: Not on file  . Attends Religious Services: Not on file  . Active Member of Clubs or Organizations: Not on file  . Attends Archivist Meetings: Not on file  . Marital Status: Not on file     Family History: The patient's family history includes Arthritis in his father; Coronary artery disease in his father and mother; Diabetes in his father and mother; Rheum arthritis in his father.  ROS:   Please see the history of present illness.    Chronic back pain, bilateral lower extremity neuropathy, and frequent falls with injury.  All other systems reviewed and are negative.  EKGs/Labs/Other Studies Reviewed:    The following studies were reviewed today: No new data  EKG:  EKG EKG not repeated today  Recent Labs: 02/09/2020: TSH 0.319 02/11/2020: ALT 23 02/13/2020: B Natriuretic Peptide 510.6 02/17/2020: BUN 11; Creatinine, Ser 1.10; Hemoglobin 13.5; Magnesium 1.8; Platelets 213; Potassium 4.6; Sodium 136  Recent Lipid Panel    Component Value Date/Time   CHOL  11/26/2010 0750    147        ATP III CLASSIFICATION:  <200     mg/dL   Desirable  200-239  mg/dL   Borderline High  >=240    mg/dL   High          TRIG 132 11/26/2010 0750   HDL 29 (L) 11/26/2010 0750   CHOLHDL 5.1 11/26/2010 0750   VLDL 26 11/26/2010 0750   LDLCALC  11/26/2010 0750    92        Total Cholesterol/HDL:CHD Risk Coronary Heart Disease Risk Table                     Men   Women  1/2 Average Risk   3.4   3.3  Average  Risk       5.0   4.4  2 X Average Risk   9.6   7.1  3 X Average Risk  23.4   11.0        Use the calculated Patient Ratio above and the CHD Risk Table to determine the patient's CHD Risk.        ATP III CLASSIFICATION (LDL):  <100     mg/dL   Optimal  100-129  mg/dL   Near or Above                    Optimal  130-159  mg/dL   Borderline  160-189  mg/dL   High  >190     mg/dL   Very High    Physical Exam:    VS:  BP 124/66   Pulse 78   Ht 6' 0.5" (1.842 m)   Wt 204 lb 11.2 oz (92.9 kg)   SpO2 97%   BMI 27.38 kg/m     Wt Readings from Last 3 Encounters:  07/20/20 204 lb 11.2 oz (92.9 kg)  07/19/20 203 lb 3.2 oz (92.2 kg)  02/14/20 189 lb 6 oz (85.9 kg)     GEN: Elderly.. No acute distress HEENT: Normal NECK: No JVD. LYMPHATICS: No lymphadenopathy CARDIAC:  RRR without murmur, gallop, or edema. VASCULAR:  Normal Pulses. No bruits. RESPIRATORY:  Clear to auscultation without rales, wheezing or rhonchi  ABDOMEN: Soft, non-tender, non-distended, No pulsatile mass, MUSCULOSKELETAL: No deformity  SKIN: Warm and dry NEUROLOGIC:  Alert and oriented x 3 PSYCHIATRIC:  Normal affect   ASSESSMENT:  1. Long term current use of anticoagulant therapy   2. Atherosclerosis of native coronary artery of native heart without angina pectoris   3. Chronic combined systolic and diastolic heart failure (HCC)   4. Paroxysmal atrial fibrillation with RVR ([place])   5. On amiodarone therapy   6. Other hyperlipidemia   7. Essential hypertension   8. Educated about COVID-19 virus infection    PLAN:    In order of problems listed above:  1. Continue Xarelto 20 mg/day.  Monitor for bleeding.  Monitor hemoglobin and creatinine twice yearly at 70-month intervals. 2. Secondary prevention discussed including lipid-lowering, glycemic control, blood pressure control, sleep, and stress control. 3. No evidence of volume overload.  Continue therapy for heart failure including Jardiance,  furosemide, metoprolol tartrate 50 mg twice daily,.  Not currently on an angiotensin system blockade because of acute kidney injury.  I believe angiotensin system blockade was discontinued during an acute illness and never resumed.  I will need to research and decide whether or not we should resume therapy. 4. Amiodarone therapy is being used to control rhythm.  He is on 200 mg/day.  TSH and liver panel should be done twice yearly. 5. TSH and liver panel twice yearly.  States he has an upcoming appointment with Dr. Laurann Montana and believes his blood work will be done at that time. 6. LDL target less than 70.  Most recent LDL last August was 43. 7. Continue Lasix, Lopressor, to control blood pressure. 8. Has been vaccinated and has even gotten a third dose.  He is in a vulnerable category and it is appropriate for him to have a boost.  Overall education and awareness concerning primary/secondary risk prevention was discussed in detail: LDL less than 70, hemoglobin A1c less than 7, blood pressure target less than 130/80 mmHg, >150 minutes of moderate aerobic activity per week, avoidance of smoking, weight control (via diet and exercise), and continued surveillance/management of/for obstructive sleep apnea.     Medication Adjustments/Labs and Tests Ordered: Current medicines are reviewed at length with the patient today.  Concerns regarding medicines are outlined above.  No orders of the defined types were placed in this encounter.  No orders of the defined types were placed in this encounter.   Patient Instructions  Medication Instructions:  Your physician recommends that you continue on your current medications as directed. Please refer to the Current Medication list given to you today.  *If you need a refill on your cardiac medications before your next appointment, please call your pharmacy*   Lab Work: None If you have labs (blood work) drawn today and your tests are completely normal, you  will receive your results only by: [name] MyChart Message (if you have MyChart) OR . A paper copy in the mail If you have any lab test that is abnormal or we need to change your treatment, we will call you to review the results.   Testing/Procedures: None   Follow-Up: At [facility], you and your health needs are our priority.  As part of our continuing mission to provide you with exceptional heart care, we have created designated Provider Care Teams.  These Care Teams include your primary Cardiologist (physician) and Advanced Practice Providers (APPs -  Physician Assistants and Nurse Practitioners) who all work together to provide you with the care you need, when you need it.  We recommend signing up for the patient portal called "MyChart".  Sign up information is provided on this After Visit Summary.  MyChart  is used to connect with patients for Virtual Visits (Telemedicine).  Patients are able to view lab/test results, encounter notes, upcoming appointments, etc.  Non-urgent messages can be sent to your provider as well.   To learn more about what you can do with MyChart, go to [email or web address].    Your next appointment:   6 month(s)  The format for your next appointment:   In Person  Provider:   You may see Sinclair Grooms, MD or one of the following Advanced Practice Providers on your designated Care Team:    Truitt Merle, NP  Cecilie Kicks, NP  Kathyrn Drown, NP    Other Instructions      Signed, Sinclair Grooms, MD  07/20/2020 4:56 PM    [place]

## 2020-07-19 NOTE — ED Provider Notes (Signed)
[facility] EMERGENCY DEPARTMENT Provider Note   CSN: [id number] Arrival date & time: 07/19/20  1046     History Chief Complaint  Patient presents with  . Fall    [name] is a 79 y.o. male.  79 year old male presents with complaint of right great toe injury. Patient has a history of neuropathy, states he was walking with his walker today when his legs gave out causing him to call and injure his toe. Patient is on Eliquis, did not hit head or loose consciousness. Reports laceration to the right great toe, wife reports deformity. Bleeding controlled with bandage prior to arrival. Last td unknown.         Past Medical History:  Diagnosis Date  . AAA (abdominal aortic aneurysm) ([place])    a. s/p repair 2009.  [name] Acid reflux    takes Prilosec and Protonix daily  . Arthritis    BacK  . CAD in native artery    a. a. prior inf MI 1993. b. PCI to LAD 05/1997. c. recurrent inferolateral MI complicated by V. fib arrest in 04/1998, prior PCI to OM1. d. 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  [name] Chronic combined systolic and diastolic CHF (congestive heart failure) ([place])   . Complication of anesthesia    -years ago hair fell out.;ileus after 2 of his surgeries  . Dry skin   . Foot drop, bilateral   . Hypercholesteremia   . Hypertension   . Ileus ([place])    After AAA  . Incisional hernia    "small, from AAA"  . Ischemic cardiomyopathy   . MI (myocardial infarction) ([place])   . 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 OSA on CPAP    uses CPAP--sleep study done at least 11yrs ago  . PAF (paroxysmal atrial fibrillation) ([place])   . Pain    back pain chronic- seen at pain clinic  . Plantar fasciitis    bilatetral  . Thrombocytopenia ([place])   . Type II diabetes mellitus [facility])     Patient Active Problem List   Diagnosis Date Noted  . Ileus ([place]) 02/13/2020  . Acute colitis 02/08/2020  . Hypokalemia 07/08/2019  . Acute metabolic encephalopathy   . Orthostatic dizziness   . Postural  dizziness with near syncope 07/04/2019  . Abdominal distention, non-gaseous 07/04/2019  . Change in mental status 07/04/2019  . Small bowel obstruction ([place]) 07/04/2019  . Postural dizziness with presyncope 05/09/2018  . Hypotension 05/08/2018  . On amiodarone therapy 08/26/2017  . Near syncope 06/11/2017  . Hypoxia   . Pneumonia of left lower lobe due to infectious organism   . Gastroenteritis and colitis, viral 04/27/2016  . Orthostasis 04/26/2016  . AKI (acute kidney injury) ([place]) 04/26/2016  . Lumbar radiculopathy, chronic 01/16/2016  . Hematoma of left kidney 11/01/2015  . Hyperlipidemia 12/12/2014  . SIRS (systemic inflammatory response syndrome) ([place]) 12/12/2014  . Influenza due to identified novel influenza A virus with other respiratory manifestations 11/07/2013  . Long term current use of anticoagulant therapy 09/08/2013    Class: Chronic  . Coronary artery disease involving native coronary artery of native heart with angina pectoris ([place]) 08/16/2013    Class: Chronic  . Chronic combined systolic and diastolic heart failure (H[facility]) 08/16/2013    Class: Chronic  . Paroxysmal atrial fibrillation with RVR ([place]) 08/13/2013  . Paralytic ileus ([place]) 05/07/2012  . Nausea and vomiting 01/01/2012  . DM (diabetes mellitus), type 2, uncontrolled w/neurologic  complication ([place]) 81/19/1478  . Hypertension   . AAA (abdominal aortic aneurysm) ([place])   . Acid reflux   . Sleep apnea     Past Surgical History:  Procedure Laterality Date  . ABDOMINAL AORTIC ANEURYSM REPAIR    . BACK SURGERY  2012-2013 X 3   Miminal Invasive x 3in [place].  [name] CARDIAC CATHETERIZATION  2009/2012  . CARDIOVERSION N/A 04/12/2015   Procedure: CARDIOVERSION;  Surgeon: Sueanne Margarita, MD;  Location: [facility];  Service: Cardiovascular;  Laterality: N/A;  . CARDIOVERSION N/A 12/26/2016   Procedure: CARDIOVERSION;  Surgeon: Sueanne Margarita, MD;  Location: [facility];  Service: Cardiovascular;  Laterality: N/A;   . COLONOSCOPY    . CORONARY ANGIOPLASTY WITH STENT PLACEMENT  1992; 1997  . LAPAROSCOPIC CHOLECYSTECTOMY    . LUMBAR LAMINECTOMY/DECOMPRESSION MICRODISCECTOMY Right 01/16/2016   Procedure: Right Lumbar five-Sacral one Laminectomy;  Surgeon: Kristeen Miss, MD;  Location: [facility];  Service: Neurosurgery;  Laterality: Right;  Right L5-S1 Laminectomy       Family History  Problem Relation Age of Onset  . Diabetes Mother   . Coronary artery disease Mother   . Diabetes Father   . Arthritis Father   . Rheum arthritis Father   . Coronary artery disease Father     Social History   Tobacco Use  . Smoking status: Former Smoker    Packs/day: 1.00    Years: 33.00    Pack years: 33.00    Types: Cigarettes    Quit date: 01/01/1992    Years since quitting: 28.5  . Smokeless tobacco: Never Used  Vaping Use  . Vaping Use: Never used  Substance Use Topics  . Alcohol use: Yes    Comment: 6/30;2017 "GLASS OF WINE Q COUPLE MONTHS, IF THAT"  . Drug use: No    Home Medications Prior to Admission medications   Medication Sig Start Date End Date Taking? Authorizing Provider  amiodarone (PACERONE) 200 MG tablet Take 1 tablet (200 mg total) by mouth daily. 03/31/20   Belva Crome, MD  amitriptyline (ELAVIL) 25 MG tablet Take 25 mg by mouth at bedtime.  12/11/18   [provider]  atorvastatin (LIPITOR) 10 MG tablet Take 10 mg by mouth daily.    [provider]  cephALEXin (KEFLEX) 500 MG capsule Take 1 capsule (500 mg total) by mouth 3 (three) times daily for 7 days. 07/19/20 07/26/20  Tacy Learn, PA-C  Coenzyme Q10 (CO Q 10) 100 MG CAPS Take 100 mg by mouth daily.    [provider]  diazepam (VALIUM) 5 MG tablet Take 5 mg by mouth at bedtime as needed for anxiety.  10/31/15   [provider]  empagliflozin (JARDIANCE) 10 MG TABS tablet Take 10 mg by mouth daily with breakfast.     [provider]  furosemide (LASIX) 40 MG tablet Take 40 mg by  mouth daily as needed for fluid.  01/11/20   [provider]  glimepiride (AMARYL) 1 MG tablet Take 1 mg by mouth daily with breakfast.    [provider]  metFORMIN (GLUCOPHAGE-XR) 500 MG 24 hr tablet Take 500-1,000 mg by mouth See admin instructions. 1,000 mg in the morning and 500 mg in the evening 10/12/15   [provider]  metoprolol tartrate (LOPRESSOR) 50 MG tablet Take 1 tablet by mouth twice daily 06/30/20   Belva Crome, MD  Multiple Vitamins-Minerals (ONE-A-DAY MENS 50+ ADVANTAGE) TABS Take 1 tablet by mouth daily.  [provider]  Olopatadine HCl (PATADAY OP) Place 1 drop into both eyes daily.    [provider]  omeprazole (PRILOSEC) 20 MG capsule Take 20 mg by mouth daily. 09/14/12   [provider]  oxyCODONE-acetaminophen (PERCOCET/ROXICET) 5-325 MG tablet Take 1 tablet by mouth every 6 (six) hours as needed (pain).     [provider]  potassium chloride (KLOR-CON) 10 MEQ tablet Take 10 mEq by mouth daily. 01/11/20   [provider]  pregabalin (LYRICA) 300 MG capsule Take 1 capsule by mouth 2 (two) times a day. 02/16/19   [provider]  rivaroxaban (XARELTO) 20 MG TABS tablet Take 20 mg by mouth daily with supper.    [provider]  trolamine salicylate (ASPERCREME) 10 % cream Apply 1 application topically as needed for muscle pain.    [provider]  vitamin B-12 (CYANOCOBALAMIN) 1000 MCG tablet Take 1,000 mcg by mouth daily.     [provider]    Allergies    Cymbalta [duloxetine hcl], Neurontin [gabapentin], and Keppra [levetiracetam]  Review of Systems   Review of Systems  Constitutional: Negative for fever.  Musculoskeletal: Positive for joint swelling. Negative for arthralgias, back pain and neck pain.  Skin: Positive for wound.  Allergic/Immunologic: Positive for immunocompromised state.  Neurological: Negative for dizziness, weakness, numbness and  headaches.  Hematological: Bruises/bleeds easily.  Psychiatric/Behavioral: Negative for confusion.    Physical Exam Updated Vital Signs BP (!) 95/56 (BP Location: Right Arm)   Pulse (!) 59   Temp (!) 97.4 F (36.3 C) (Oral)   Resp 19   Ht 6' 0.5" (1.842 m)   Wt 92.2 kg   SpO2 99%   BMI 27.18 kg/m   Physical Exam Vitals and nursing note reviewed.  Constitutional:      General: He is not in acute distress.    Appearance: He is well-developed. He is not diaphoretic.  HENT:     Head: Normocephalic and atraumatic.  Cardiovascular:     Pulses: Normal pulses.  Pulmonary:     Effort: Pulmonary effort is normal.  Musculoskeletal:        General: Swelling and signs of injury present. No tenderness or deformity.     Comments: Laceration to plantar surface at the 1st MTP right great toe. No injury to the nailbed, bleeding controlled.   Skin:    General: Skin is warm and dry.     Capillary Refill: Capillary refill takes less than 2 seconds.     Findings: No erythema or rash.  Neurological:     Mental Status: He is alert and oriented to person, place, and time.     Sensory: No sensory deficit.  Psychiatric:        Behavior: Behavior normal.     ED Results / Procedures / Treatments   Labs (all labs ordered are listed, but only abnormal results are displayed) Labs Reviewed - No data to display  EKG None  Radiology DG Foot Complete Right  Result Date: 07/19/2020 CLINICAL DATA:  Fall, injury EXAM: RIGHT FOOT COMPLETE - 3+ VIEW COMPARISON:  None. FINDINGS: There is dislocation noted at the right great toe interphalangeal joint. The distal phalanx is dislocated anteriorly relative to the proximal phalanx. Mild degenerative changes at the 1st MTP joint. No fracture. IMPRESSION: Dislocation at the right great toe IP joint. Electronically Signed   By: Rolm Baptise M.D.   On: 07/19/2020 11:44   DG Toe Great Right  Result Date: 07/19/2020 CLINICAL  DATA:  Post reduction first IP  joint dislocation EXAM: RIGHT FIRST TOE: 3 V COMPARISON:  None. FINDINGS: Frontal, oblique, and lateral views were obtained. There has been interval reduction IP joint dislocation of the first digit. There is currently no evident dislocation. There are foci of calcification along the volar aspect of the joint. No acute fracture is demonstrable. There is narrowing of the first IP and MTP joints. No erosion. IMPRESSION: Interval reduction of first IP joint dislocation. Several intra-articular region calcifications are noted. An acute fracture is not appreciable on current examination. There is narrowing of the first MTP and IP joints. Electronically Signed   By: Lowella Grip III M.D.   On: 07/19/2020 13:20    Procedures .Marland KitchenLaceration Repair  Date/Time: 07/19/2020 12:51 PM Performed by: Tacy Learn, PA-C Authorized by: Tacy Learn, PA-C   Consent:    Consent obtained:  Verbal   Consent given by:  Patient and spouse   Risks discussed:  Infection, need for additional repair, pain, poor cosmetic result, poor wound healing, tendon damage and vascular damage   Alternatives discussed:  No treatment and delayed treatment Universal protocol:    Procedure explained and questions answered to patient or proxy's satisfaction: yes     Relevant documents present and verified: yes     Test results available and properly labeled: yes     Imaging studies available: yes     Required blood products, implants, devices, and special equipment available: yes     Site/side marked: yes     Immediately prior to procedure, a time out was called: yes     Patient identity confirmed:  Verbally with patient Anesthesia (see MAR for exact dosages):    Anesthesia method:  Local infiltration   Local anesthetic:  Lidocaine 1% w/o epi Laceration details:    Location:  Toe   Toe location:  R big toe   Length (cm):  5   Depth (mm):  3 Repair type:    Repair type:  Intermediate Pre-procedure details:     Preparation:  Patient was prepped and draped in usual sterile fashion and imaging obtained to evaluate for foreign bodies Exploration:    Hemostasis achieved with:  Direct pressure   Wound exploration: wound explored through full range of motion and entire depth of wound probed and visualized     Wound extent: no foreign bodies/material noted, no muscle damage noted and no underlying fracture noted     Contaminated: no   Treatment:    Area cleansed with:  Saline   Amount of cleaning:  Extensive   Irrigation solution:  Sterile saline Skin repair:    Repair method:  Sutures   Suture size:  4-0   Suture material:  Nylon   Suture technique:  Simple interrupted   Number of sutures:  8 Approximation:    Approximation:  Close Post-procedure details:    Dressing:  Bulky dressing and splint for protection   Patient tolerance of procedure:  Tolerated well, no immediate complications .Ortho Injury Treatment  Date/Time: 07/19/2020 12:52 PM Performed by: Tacy Learn, PA-C Authorized by: Tacy Learn, PA-C   Consent:    Consent obtained:  Verbal   Consent given by:  Patient   Risks discussed:  Fracture, irreducible dislocation, recurrent dislocation, nerve damage, restricted joint movement, stiffness and vascular damage   Alternatives discussed:  No treatmentInjury location: toe Location details: right great toe Injury type: dislocation Dislocation type: IP Pre-procedure neurovascular assessment: neurovascularly intact Pre-procedure distal  perfusion: normal Pre-procedure neurological function: normal Pre-procedure range of motion: reduced Anesthesia: digital block  Anesthesia: Local anesthesia used: yes Local Anesthetic: lidocaine 1% without epinephrine Anesthetic total: 5 mL  Patient sedated: NoManipulation performed: yes Reduction successful: yes X-ray confirmed reduction: yes Immobilization: splint Splint type: buddy tape, post op shoe. Post-procedure neurovascular  assessment: post-procedure neurovascularly intact Post-procedure distal perfusion: normal Post-procedure neurological function: normal Post-procedure range of motion: normal Patient tolerance: patient tolerated the procedure well with no immediate complications    (including critical care time)  Medications Ordered in ED Medications  lidocaine (PF) (XYLOCAINE) 1 % injection 5 mL (5 mLs Infiltration Given 07/19/20 1322)    ED Course  I have reviewed the triage vital signs and the nursing notes.  Pertinent labs & imaging results that were available during my care of the patient were reviewed by me and considered in my medical decision making (see chart for details).  Clinical Course as of Jul 19 1329  Wed Jul 20, 2131  726 79 year old male with right great toe injury as above. On exam, found to have a laceration to the plantar aspect of the MTP with swelling and tenderness of the toe. XR of the foot shows IP joint dislocation right great toe.  Dislocation was reduced after digital block.  Wound was thoroughly irrigated and closed with simple interrupted sutures. Post reduction XR ordered. Td status not verified, patient will check with PCP next week.   [LM]  1329 XR shows successful reduction. Will splint/buddy tape and place in post op shoe. Given keflex x 7 days due to lac on bottom of foot with dislocation. Recommend wound check with PCP in 2 days, pt to verify Td status with PCP.    [LM]    Clinical Course User Index [LM] Roque Lias   MDM Rules/Calculators/A&P                          Final Clinical Impression(s) / ED Diagnoses Final diagnoses:  Fall, initial encounter  Laceration of right great toe without foreign body present or damage to nail, initial encounter  Dislocation of phalanx of right foot, initial encounter    Rx / DC Orders ED Discharge Orders         Ordered    cephALEXin (KEFLEX) 500 MG capsule  3 times daily        07/19/20 1328            Roque Lias 07/19/20 1330    Gareth Morgan, MD 07/20/20 1054

## 2020-07-19 NOTE — ED Notes (Signed)
Rt big toe  Dressed with 2x2 and buddy taped to 2nd toe , wife getting extra dressings pt fitted with post op shoe

## 2020-07-19 NOTE — Discharge Instructions (Addendum)
Keep wound clean and dry. Splint toe and wear supportive shoe. Do NOT soak wound or use peroxide on the wound. Recheck with your doctor in 2 days, suture removal in 10 days with your PCP. Verify your tetanus vaccine status with your doctor- it should be within the past 5 years.   Take Keflex as prescribed to try and prevent infection due to wound on the bottom of your foot with dislocation.

## 2020-07-20 ENCOUNTER — Ambulatory Visit: Payer: Medicare Other | Admitting: Interventional Cardiology

## 2020-07-20 ENCOUNTER — Encounter: Payer: Self-pay | Admitting: Interventional Cardiology

## 2020-07-20 VITALS — BP 124/66 | HR 78 | Ht 72.5 in | Wt 204.7 lb

## 2020-07-20 DIAGNOSIS — Z7189 Other specified counseling: Secondary | ICD-10-CM

## 2020-07-20 DIAGNOSIS — I5042 Chronic combined systolic (congestive) and diastolic (congestive) heart failure: Secondary | ICD-10-CM | POA: Diagnosis not present

## 2020-07-20 DIAGNOSIS — Z7901 Long term (current) use of anticoagulants: Secondary | ICD-10-CM | POA: Diagnosis not present

## 2020-07-20 DIAGNOSIS — I2510 Atherosclerotic heart disease of native coronary artery without angina pectoris: Secondary | ICD-10-CM

## 2020-07-20 DIAGNOSIS — Z79899 Other long term (current) drug therapy: Secondary | ICD-10-CM

## 2020-07-20 DIAGNOSIS — E7849 Other hyperlipidemia: Secondary | ICD-10-CM

## 2020-07-20 DIAGNOSIS — I480 Paroxysmal atrial fibrillation: Secondary | ICD-10-CM | POA: Diagnosis not present

## 2020-07-20 DIAGNOSIS — I10 Essential (primary) hypertension: Secondary | ICD-10-CM

## 2020-07-20 NOTE — Patient Instructions (Signed)

## 2020-07-25 ENCOUNTER — Telehealth: Payer: Self-pay | Admitting: Interventional Cardiology

## 2020-07-25 NOTE — Telephone Encounter (Signed)
Patient c/o Palpitations:  High priority if patient c/o lightheadedness, shortness of breath, or chest pain  1) How long have you had palpitations/irregular HR/ Afib? Are you having the symptoms now? Patient states he has been in afib since 07/24/20 at noon / he is currently still in afib  2) Are you currently experiencing lightheadedness, SOB or CP? Mild CP  3) Do you have a history of afib (atrial fibrillation) or irregular heart rhythm? Yes  4) Have you checked your BP or HR? (document readings if available):  No  5) Are you experiencing any other symptoms? No   ?

## 2020-07-25 NOTE — Telephone Encounter (Signed)
Returned call to patient. He reports the following:  2:00 pm BP 107/70, HR 103 3:00 pm BP 138/80, HR 106 He denies concerns except that he is worried that his HR will get too fast again. I advised him to continue to monitor his HR and BP based on his symptoms and to make certain he takes his metoprolol every 12 hours without gap. I advised that I will forward message to Dr. Tamala Julian and his RN, Anderson Malta for further advice. I discussed the possibility of an additional low dose medication for breath through a fib but advised that if his BP is low, like the reading of 107 at 2:00, that we would not advise him to take additional medication. Reviewed ER precautions with patient and answered questions to his satisfaction. He thanked me for the call.

## 2020-07-25 NOTE — Telephone Encounter (Signed)
Spoke with patient who reports he has been in a fib since yesterday. States he had a vigorous workout at PT and came home very fatigued, so he took a nap. HR was 144 bpm yesterday prior to his nap and then came down to 120 later. He slept all night without problem and just got up at noon today. HR is 111 bpm currently, states BP reading will not register. He took his metoprolol and amiodarone approximately 45 minutes ago. States he has mild CP, no SOB or dizziness. I advised him that I will give him some more time for the medication to take effect and call him back in 1 hour. He verbalized agreement with plan.

## 2020-07-27 NOTE — Telephone Encounter (Signed)
[name] please see how he is doing.

## 2020-07-28 NOTE — Telephone Encounter (Signed)
Left message to call back  

## 2020-08-02 ENCOUNTER — Telehealth: Payer: Self-pay | Admitting: Interventional Cardiology

## 2020-08-02 NOTE — Telephone Encounter (Signed)
Left message to call back  

## 2020-08-02 NOTE — Telephone Encounter (Signed)
      I went on pt chart to see who call. The call was transferred to [facility]

## 2020-08-02 NOTE — Telephone Encounter (Signed)
Spoke with wife and she states pt doing better now.  They spoke with PCP last week and they sent him for a covid test and it was negative.  They seen pt on Monday and diagnosed him with pneumonia.  Pt has been on strong antibiotics since then and wife states he is feeling much better.  Does not believe he was ever in AF, was just concerned due to the mild CP he was having.  HRs now normal. Advised if any changes, please don't hesitate to call the office.  Wife appreciative for call.

## 2020-08-04 ENCOUNTER — Emergency Department (HOSPITAL_BASED_OUTPATIENT_CLINIC_OR_DEPARTMENT_OTHER)
Admission: EM | Admit: 2020-08-04 | Discharge: 2020-08-04 | Disposition: A | Discharge status: Home / Self Care | Payer: Medicare Other | Source: Home / Self Care | Attending: Emergency Medicine | Admitting: Emergency Medicine

## 2020-08-04 ENCOUNTER — Encounter (HOSPITAL_BASED_OUTPATIENT_CLINIC_OR_DEPARTMENT_OTHER): Payer: Self-pay | Admitting: Emergency Medicine

## 2020-08-04 ENCOUNTER — Other Ambulatory Visit: Payer: Self-pay

## 2020-08-04 ENCOUNTER — Emergency Department (HOSPITAL_BASED_OUTPATIENT_CLINIC_OR_DEPARTMENT_OTHER): Payer: Medicare Other

## 2020-08-04 DIAGNOSIS — Z87891 Personal history of nicotine dependence: Secondary | ICD-10-CM | POA: Insufficient documentation

## 2020-08-04 DIAGNOSIS — E11628 Type 2 diabetes mellitus with other skin complications: Secondary | ICD-10-CM | POA: Diagnosis not present

## 2020-08-04 DIAGNOSIS — X58XXXD Exposure to other specified factors, subsequent encounter: Secondary | ICD-10-CM | POA: Insufficient documentation

## 2020-08-04 DIAGNOSIS — S91111D Laceration without foreign body of right great toe without damage to nail, subsequent encounter: Secondary | ICD-10-CM | POA: Insufficient documentation

## 2020-08-04 DIAGNOSIS — Z7984 Long term (current) use of oral hypoglycemic drugs: Secondary | ICD-10-CM | POA: Insufficient documentation

## 2020-08-04 DIAGNOSIS — I5042 Chronic combined systolic (congestive) and diastolic (congestive) heart failure: Secondary | ICD-10-CM | POA: Insufficient documentation

## 2020-08-04 DIAGNOSIS — E11621 Type 2 diabetes mellitus with foot ulcer: Secondary | ICD-10-CM | POA: Insufficient documentation

## 2020-08-04 DIAGNOSIS — L97509 Non-pressure chronic ulcer of other part of unspecified foot with unspecified severity: Secondary | ICD-10-CM | POA: Insufficient documentation

## 2020-08-04 DIAGNOSIS — I110 Hypertensive heart disease with heart failure: Secondary | ICD-10-CM | POA: Insufficient documentation

## 2020-08-04 DIAGNOSIS — Z79899 Other long term (current) drug therapy: Secondary | ICD-10-CM | POA: Insufficient documentation

## 2020-08-04 DIAGNOSIS — Z7901 Long term (current) use of anticoagulants: Secondary | ICD-10-CM | POA: Insufficient documentation

## 2020-08-04 DIAGNOSIS — Z5189 Encounter for other specified aftercare: Secondary | ICD-10-CM

## 2020-08-04 DIAGNOSIS — I2510 Atherosclerotic heart disease of native coronary artery without angina pectoris: Secondary | ICD-10-CM | POA: Insufficient documentation

## 2020-08-04 DIAGNOSIS — S91311D Laceration without foreign body, right foot, subsequent encounter: Secondary | ICD-10-CM

## 2020-08-04 MED ORDER — CEPHALEXIN 500 MG PO CAPS: 500.0000 mg | ORAL_CAPSULE | Freq: Three times a day (TID) | ORAL | 0 refills | Status: DC

## 2020-08-04 MED ORDER — CEPHALEXIN 250 MG PO CAPS
500.0000 mg | ORAL_CAPSULE | Freq: Once | ORAL | Status: AC
  Administered 2020-08-04: 500 mg via ORAL
  Filled 2020-08-04: qty 2

## 2020-08-04 NOTE — ED Provider Notes (Signed)
[name] Provider Note   CSN: [id number] Arrival date & time: 08/04/20  1920     History Chief Complaint  Patient presents with  . Extremity Laceration    [name] is a 79 y.o. male who presents to the ED today with complaint of bleeding from laceration to his R great toe that he sustained on 09/22. Pt reports that he had sutures placed to his R great toe in the ED on that day; he had them removed on Monday of this week without issue however reports that today he noticed a pool of blood through his bandage and onto the floor causing him concern. He has neuropathy from diabetes and is unsure if he stumped his toe against something earlier today. He came to the ED for further evaluation.   The history is provided by the patient and medical records.       Past Medical History:  Diagnosis Date  . AAA (abdominal aortic aneurysm) ([place])    a. s/p repair 2009.  [name] Acid reflux    takes Prilosec and Protonix daily  . Arthritis    BacK  . CAD in native artery    a. a. prior inf MI 1993. b. PCI to LAD 05/1997. c. recurrent inferolateral MI complicated by V. fib arrest in 04/1998, prior PCI to OM1. d. 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  [name] Chronic combined systolic and diastolic CHF (congestive heart failure) ([place])   . Complication of anesthesia    -years ago hair fell out.;ileus after 2 of his surgeries  . Dry skin   . Foot drop, bilateral   . Hypercholesteremia   . Hypertension   . Ileus ([place])    After AAA  . Incisional hernia    "small, from AAA"  . Ischemic cardiomyopathy   . MI (myocardial infarction) ([place])   . 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 OSA on CPAP    uses CPAP--sleep study done at least 43yrs ago  . PAF (paroxysmal atrial fibrillation) ([place])   . Pain    back pain chronic- seen at pain clinic  . Plantar fasciitis    bilatetral  . Thrombocytopenia ([place])   . Type II diabetes mellitus [facility])     Patient Active Problem List    Diagnosis Date Noted  . Ileus ([place]) 02/13/2020  . Acute colitis 02/08/2020  . Hypokalemia 07/08/2019  . Acute metabolic encephalopathy   . Orthostatic dizziness   . Postural dizziness with near syncope 07/04/2019  . Abdominal distention, non-gaseous 07/04/2019  . Change in mental status 07/04/2019  . Small bowel obstruction ([place]) 07/04/2019  . Postural dizziness with presyncope 05/09/2018  . Hypotension 05/08/2018  . On amiodarone therapy 08/26/2017  . Near syncope 06/11/2017  . Hypoxia   . Pneumonia of left lower lobe due to infectious organism   . Gastroenteritis and colitis, viral 04/27/2016  . Orthostasis 04/26/2016  . AKI (acute kidney injury) ([place]) 04/26/2016  . Lumbar radiculopathy, chronic 01/16/2016  . Hematoma of left kidney 11/01/2015  . Hyperlipidemia 12/12/2014  . SIRS (systemic inflammatory response syndrome) ([place]) 12/12/2014  . Influenza due to identified novel influenza A virus with other respiratory manifestations 11/07/2013  . Long term current use of anticoagulant therapy 09/08/2013    Class: Chronic  . Coronary artery disease involving native coronary artery of native heart with angina pectoris ([place]) 08/16/2013    Class: Chronic  . Chronic combined systolic and diastolic heart failure ([place])  08/16/2013    Class: Chronic  . Paroxysmal atrial fibrillation with RVR ([place]) 08/13/2013  . Paralytic ileus ([place]) 05/07/2012  . Nausea and vomiting 01/01/2012  . DM (diabetes mellitus), type 2, uncontrolled w/neurologic complication ([place]) 00/86/7619  . Hypertension   . AAA (abdominal aortic aneurysm) ([place])   . Acid reflux   . Sleep apnea     Past Surgical History:  Procedure Laterality Date  . ABDOMINAL AORTIC ANEURYSM REPAIR    . BACK SURGERY  2012-2013 X 3   Miminal Invasive x 3in [place].  [name] CARDIAC CATHETERIZATION  2009/2012  . CARDIOVERSION N/A 04/12/2015   Procedure: CARDIOVERSION;  Surgeon: Sueanne Margarita, MD;  Location: [facility];  Service:  Cardiovascular;  Laterality: N/A;  . CARDIOVERSION N/A 12/26/2016   Procedure: CARDIOVERSION;  Surgeon: Sueanne Margarita, MD;  [facility];  Service: Cardiovascular;  Laterality: N/A;  . COLONOSCOPY    . CORONARY ANGIOPLASTY WITH STENT PLACEMENT  1992; 1997  . LAPAROSCOPIC CHOLECYSTECTOMY    . LUMBAR LAMINECTOMY/DECOMPRESSION MICRODISCECTOMY Right 01/16/2016   Procedure: Right Lumbar five-Sacral one Laminectomy;  Surgeon: Kristeen Miss, MD;  Location: [facility];  Service: Neurosurgery;  Laterality: Right;  Right L5-S1 Laminectomy       Family History  Problem Relation Age of Onset  . Diabetes Mother   . Coronary artery disease Mother   . Diabetes Father   . Arthritis Father   . Rheum arthritis Father   . Coronary artery disease Father     Social History   Tobacco Use  . Smoking status: Former Smoker    Packs/day: 1.00    Years: 33.00    Pack years: 33.00    Types: Cigarettes    Quit date: 01/01/1992    Years since quitting: 28.6  . Smokeless tobacco: Never Used  Vaping Use  . Vaping Use: Never used  Substance Use Topics  . Alcohol use: Yes    Comment: 6/30;2017 "GLASS OF WINE Q COUPLE MONTHS, IF THAT"  . Drug use: No    Home Medications Prior to Admission medications   Medication Sig Start Date End Date Taking? Authorizing Provider  amiodarone (PACERONE) 200 MG tablet Take 1 tablet (200 mg total) by mouth daily. 03/31/20   Belva Crome, MD  amitriptyline (ELAVIL) 25 MG tablet Take 25 mg by mouth at bedtime.  12/11/18   [provider]  atorvastatin (LIPITOR) 10 MG tablet Take 10 mg by mouth daily.    [provider]  cephALEXin (KEFLEX) 500 MG capsule Take 1 capsule (500 mg total) by mouth 3 (three) times daily for 7 days. 08/04/20 08/11/20  Kristopher Maize, PA-C  Coenzyme Q10 (CO Q 10) 100 MG CAPS Take 100 mg by mouth daily.    [provider]  diazepam (VALIUM) 5 MG tablet Take 5 mg by mouth at bedtime as needed for anxiety.  10/31/15    [provider]  empagliflozin (JARDIANCE) 10 MG TABS tablet Take 10 mg by mouth daily with breakfast.     [provider]  furosemide (LASIX) 40 MG tablet Take 40 mg by mouth daily as needed for fluid.  01/11/20   [provider]  glimepiride (AMARYL) 1 MG tablet Take 1 mg by mouth daily with breakfast.    [provider]  metFORMIN (GLUCOPHAGE-XR) 500 MG 24 hr tablet Take 500-1,000 mg by mouth See admin instructions. 1,000 mg in the morning and 500 mg in the evening 10/12/15   [provider]  metoprolol  tartrate (LOPRESSOR) 50 MG tablet Take 1 tablet by mouth twice daily 06/30/20   Belva Crome, MD  Multiple Vitamins-Minerals (ONE-A-DAY MENS 50+ ADVANTAGE) TABS Take 1 tablet by mouth daily.    [provider]  Olopatadine HCl (PATADAY OP) Place 1 drop into both eyes daily.    [provider]  omeprazole (PRILOSEC) 20 MG capsule Take 20 mg by mouth daily. 09/14/12   [provider]  oxyCODONE-acetaminophen (PERCOCET/ROXICET) 5-325 MG tablet Take 1 tablet by mouth every 6 (six) hours as needed (pain).     [provider]  pregabalin (LYRICA) 300 MG capsule Take 1 capsule by mouth 2 (two) times a day. 02/16/19   [provider]  rivaroxaban (XARELTO) 20 MG TABS tablet Take 20 mg by mouth daily with supper.    [provider]  trolamine salicylate (ASPERCREME) 10 % cream Apply 1 application topically as needed for muscle pain.    [provider]  vitamin B-12 (CYANOCOBALAMIN) 1000 MCG tablet Take 1,000 mcg by mouth daily.     [provider]    Allergies    Cymbalta [duloxetine hcl], Neurontin [gabapentin], and Keppra [levetiracetam]  Review of Systems   Review of Systems  Constitutional: Negative for fever.  Musculoskeletal: Negative for arthralgias.  Skin: Positive for wound.    Physical Exam Updated Vital Signs BP 114/68 (BP Location: Right Arm)   Pulse 78   Temp 98.2 F  (36.8 C) (Oral)   Resp 20   SpO2 99%   Physical Exam Vitals and nursing note reviewed.  Constitutional:      Appearance: He is not ill-appearing.  HENT:     Head: Normocephalic and atraumatic.  Eyes:     Conjunctiva/sclera: Conjunctivae normal.  Cardiovascular:     Rate and Rhythm: Normal rate and regular rhythm.     Pulses: Normal pulses.  Pulmonary:     Effort: Pulmonary effort is normal.     Breath sounds: Normal breath sounds. No wheezing, rhonchi or rales.  Musculoskeletal:     Comments: See photo below  Healing 2 cm laceration via secondary intention noted to plantar aspect of R great toe along MTP joint line. Bleeding controlled at this time.  Stage I Pressure ulcer noted to R 2nd toe with surrounding swelling and erythema.  2+ DP pulse.   Skin:    General: Skin is warm and dry.     Coloration: Skin is not jaundiced.  Neurological:     Mental Status: He is alert.         ED Results / Procedures / Treatments   Labs (all labs ordered are listed, but only abnormal results are displayed) Labs Reviewed - No data to display  EKG None  Radiology DG Toe 2nd Right  Result Date: 08/04/2020 CLINICAL DATA:  Re-injury second toe.  Bleeding. EXAM: RIGHT SECOND TOE COMPARISON:  None. FINDINGS: Soft tissue swelling is present in the distal digit. Joints are located. No acute or healing fractures are present. Tissues are otherwise unremarkable. IMPRESSION: Soft tissue swelling of the distal digit without underlying fracture. Electronically Signed   By: San Morelle M.D.   On: 08/04/2020 22:06    Procedures Procedures (including critical care time)  Medications Ordered in ED Medications  cephALEXin (KEFLEX) capsule 500 mg (has no administration in time range)    ED Course  I have reviewed the triage vital signs and the nursing notes.  Pertinent labs & imaging results that were available during my care of  the patient were reviewed by me and considered in my  medical decision making (see chart for details).    MDM Rules/Calculators/A&P                          79 year old male who presents to the ED today for extremity laceration.  He was initially seen on 09/22 after sustaining a laceration to the plantar aspect of his right great toe and had 3 sutures placed.  He had sutures removed earlier this week however noticed bleeding at site earlier today prompting him to come to the ED.  On arrival to the ED patient is afebrile, nontachycardic nontachypneic.  No bleeding was noted from dressing and patient was in the waiting room.  On presenting to one of the rooms a photo was taken, see above.  Laceration to the right great toe does peer to be healing by secondary intention.  Given that this laceration was sustained over 2 weeks ago I do not feel that sutures are appropriate at this time.  Will let wound heal by secondary intention.  Of note patient does have a new pressure ulcer to the second toe will likely from buddy taping too tightly with great toe.  Given his history of diabetes we will plan to x-ray at this time to rule out osteomyelitis.  We will plan to discharge home with antibiotics.  It does appear he was discharged home with Keflex on 09/22 however did not finish entire course as he began having a cough and was diagnosed with pneumonia after a negative Covid test.  He was placed on Levaquin and has subsequently finished 5 days of this.  We will plan to place back on Keflex.  X-ray negative at this time.  Will apply saying to wound as well as bacitracin ointment.  Have discussed this with wife.  Have instructed that they return to the ED first thing Monday morning or PCPs office first thing Monday morning for evaluation.  Wife is taken a photo and will closely monitor the wound herself.  Have instructed that if wound bleeds again and they are unable to stop with pressure dressing for 10 to 20 minutes they will need to come back to the ED.  Patient has high  likelihood of bleeding secondary to being on Xarelto.  Wife is in agreement with plan.  Have given patient 1 dose of oral Keflex in the ED and discharged home with same.   This note was prepared using Dragon voice recognition software and may include unintentional dictation errors due to the inherent limitations of voice recognition software.   Final Clinical Impression(s) / ED Diagnoses Final diagnoses:  Visit for wound check  Laceration of right foot, subsequent encounter    Rx / DC Orders ED Discharge Orders         Ordered    cephALEXin (KEFLEX) 500 MG capsule  3 times daily        08/04/20 2239           Discharge Instructions     Please pick up antibiotic and take as prescribed. It is important to finish the entire course.  Keep wounds clean and dry. If you wound bleeds please apply pressure and wait 10-20 minutes. If you are unable to stop bleeding after that you will need to come back to the ED for recheck.   Return to the ED or PCP first thing Monday morning for wound recheck. Return sooner if wound  begins looking worse or redness spreads into food or you begin having a fever > 100.4.        Kristopher Maize, PA-C 08/04/20 2245    Quintella Reichert, MD 08/04/20 2333

## 2020-08-04 NOTE — ED Triage Notes (Signed)
Seen here 9/22 for foot laceration. States bumped toe 1 hour ago , saw blood and became concerned. No bleeding through dressing in triage.

## 2020-08-04 NOTE — Discharge Instructions (Addendum)
Please pick up antibiotic and take as prescribed. It is important to finish the entire course.  Keep wounds clean and dry. If you wound bleeds please apply pressure and wait 10-20 minutes. If you are unable to stop bleeding after that you will need to come back to the ED for recheck.   Return to the ED or PCP first thing Monday morning for wound recheck. Return sooner if wound begins looking worse or redness spreads into food or you begin having a fever > 100.4.

## 2020-08-06 ENCOUNTER — Emergency Department (HOSPITAL_BASED_OUTPATIENT_CLINIC_OR_DEPARTMENT_OTHER): Payer: Medicare Other

## 2020-08-06 ENCOUNTER — Encounter (HOSPITAL_BASED_OUTPATIENT_CLINIC_OR_DEPARTMENT_OTHER): Payer: Self-pay

## 2020-08-06 ENCOUNTER — Inpatient Hospital Stay (HOSPITAL_BASED_OUTPATIENT_CLINIC_OR_DEPARTMENT_OTHER)
Admission: EM | Admit: 2020-08-06 | Discharge: 2020-08-08 | DRG: 638 | Disposition: A | Discharge status: Home Health | Payer: Medicare Other | Attending: Internal Medicine | Admitting: Internal Medicine

## 2020-08-06 ENCOUNTER — Other Ambulatory Visit: Payer: Self-pay

## 2020-08-06 DIAGNOSIS — Z7901 Long term (current) use of anticoagulants: Secondary | ICD-10-CM

## 2020-08-06 DIAGNOSIS — L03031 Cellulitis of right toe: Secondary | ICD-10-CM | POA: Diagnosis present

## 2020-08-06 DIAGNOSIS — Z87891 Personal history of nicotine dependence: Secondary | ICD-10-CM | POA: Diagnosis not present

## 2020-08-06 DIAGNOSIS — E11628 Type 2 diabetes mellitus with other skin complications: Secondary | ICD-10-CM | POA: Diagnosis present

## 2020-08-06 DIAGNOSIS — G4733 Obstructive sleep apnea (adult) (pediatric): Secondary | ICD-10-CM | POA: Diagnosis present

## 2020-08-06 DIAGNOSIS — E7800 Pure hypercholesterolemia, unspecified: Secondary | ICD-10-CM | POA: Diagnosis present

## 2020-08-06 DIAGNOSIS — E11621 Type 2 diabetes mellitus with foot ulcer: Secondary | ICD-10-CM | POA: Diagnosis present

## 2020-08-06 DIAGNOSIS — Z888 Allergy status to other drugs, medicaments and biological substances status: Secondary | ICD-10-CM | POA: Diagnosis not present

## 2020-08-06 DIAGNOSIS — Z7984 Long term (current) use of oral hypoglycemic drugs: Secondary | ICD-10-CM

## 2020-08-06 DIAGNOSIS — Z20822 Contact with and (suspected) exposure to covid-19: Secondary | ICD-10-CM | POA: Diagnosis present

## 2020-08-06 DIAGNOSIS — Z955 Presence of coronary angioplasty implant and graft: Secondary | ICD-10-CM | POA: Diagnosis not present

## 2020-08-06 DIAGNOSIS — I110 Hypertensive heart disease with heart failure: Secondary | ICD-10-CM | POA: Diagnosis present

## 2020-08-06 DIAGNOSIS — I480 Paroxysmal atrial fibrillation: Secondary | ICD-10-CM | POA: Diagnosis present

## 2020-08-06 DIAGNOSIS — I252 Old myocardial infarction: Secondary | ICD-10-CM

## 2020-08-06 DIAGNOSIS — E1140 Type 2 diabetes mellitus with diabetic neuropathy, unspecified: Secondary | ICD-10-CM | POA: Diagnosis present

## 2020-08-06 DIAGNOSIS — I10 Essential (primary) hypertension: Secondary | ICD-10-CM | POA: Diagnosis present

## 2020-08-06 DIAGNOSIS — E1165 Type 2 diabetes mellitus with hyperglycemia: Secondary | ICD-10-CM | POA: Diagnosis present

## 2020-08-06 DIAGNOSIS — L089 Local infection of the skin and subcutaneous tissue, unspecified: Secondary | ICD-10-CM | POA: Diagnosis not present

## 2020-08-06 DIAGNOSIS — Z8679 Personal history of other diseases of the circulatory system: Secondary | ICD-10-CM

## 2020-08-06 DIAGNOSIS — D6869 Other thrombophilia: Secondary | ICD-10-CM | POA: Diagnosis not present

## 2020-08-06 DIAGNOSIS — IMO0002 Reserved for concepts with insufficient information to code with codable children: Secondary | ICD-10-CM | POA: Diagnosis present

## 2020-08-06 DIAGNOSIS — L97519 Non-pressure chronic ulcer of other part of right foot with unspecified severity: Secondary | ICD-10-CM | POA: Diagnosis present

## 2020-08-06 DIAGNOSIS — M869 Osteomyelitis, unspecified: Secondary | ICD-10-CM

## 2020-08-06 DIAGNOSIS — Z8674 Personal history of sudden cardiac arrest: Secondary | ICD-10-CM | POA: Diagnosis not present

## 2020-08-06 DIAGNOSIS — I2510 Atherosclerotic heart disease of native coronary artery without angina pectoris: Secondary | ICD-10-CM | POA: Diagnosis present

## 2020-08-06 DIAGNOSIS — Z79899 Other long term (current) drug therapy: Secondary | ICD-10-CM | POA: Diagnosis not present

## 2020-08-06 DIAGNOSIS — I5042 Chronic combined systolic (congestive) and diastolic (congestive) heart failure: Secondary | ICD-10-CM | POA: Diagnosis present

## 2020-08-06 DIAGNOSIS — E1149 Type 2 diabetes mellitus with other diabetic neurological complication: Secondary | ICD-10-CM | POA: Diagnosis present

## 2020-08-06 DIAGNOSIS — D6859 Other primary thrombophilia: Secondary | ICD-10-CM | POA: Diagnosis present

## 2020-08-06 DIAGNOSIS — L03119 Cellulitis of unspecified part of limb: Secondary | ICD-10-CM | POA: Diagnosis not present

## 2020-08-06 LAB — CBC WITH DIFFERENTIAL/PLATELET
Abs Immature Granulocytes: 0.13 10*3/uL — ABNORMAL HIGH (ref 0.00–0.07)
Basophils Absolute: 0 10*3/uL (ref 0.0–0.1)
Basophils Relative: 1 %
Eosinophils Absolute: 0 10*3/uL (ref 0.0–0.5)
Eosinophils Relative: 1 %
HCT: 34.8 % — ABNORMAL LOW (ref 39.0–52.0)
Hemoglobin: 11.3 g/dL — ABNORMAL LOW (ref 13.0–17.0)
Immature Granulocytes: 3 %
Lymphocytes Relative: 20 %
Lymphs Abs: 0.8 10*3/uL (ref 0.7–4.0)
MCH: 31 pg (ref 26.0–34.0)
MCHC: 32.5 g/dL (ref 30.0–36.0)
MCV: 95.6 fL (ref 80.0–100.0)
Monocytes Absolute: 0.7 10*3/uL (ref 0.1–1.0)
Monocytes Relative: 15 %
Neutro Abs: 2.6 10*3/uL (ref 1.7–7.7)
Neutrophils Relative %: 60 %
Platelets: 207 10*3/uL (ref 150–400)
RBC: 3.64 MIL/uL — ABNORMAL LOW (ref 4.22–5.81)
RDW: 14.6 % (ref 11.5–15.5)
WBC: 4.3 10*3/uL (ref 4.0–10.5)
nRBC: 0 % (ref 0.0–0.2)

## 2020-08-06 LAB — RESPIRATORY PANEL BY RT PCR (FLU A&B, COVID)
Influenza A by PCR: NEGATIVE
Influenza B by PCR: NEGATIVE
SARS Coronavirus 2 by RT PCR: NEGATIVE

## 2020-08-06 LAB — COMPREHENSIVE METABOLIC PANEL
ALT: 18 U/L (ref 0–44)
AST: 17 U/L (ref 15–41)
Albumin: 3.1 g/dL — ABNORMAL LOW (ref 3.5–5.0)
Alkaline Phosphatase: 72 U/L (ref 38–126)
Anion gap: 9 (ref 5–15)
BUN: 19 mg/dL (ref 8–23)
CO2: 27 mmol/L (ref 22–32)
Calcium: 9 mg/dL (ref 8.9–10.3)
Chloride: 102 mmol/L (ref 98–111)
Creatinine, Ser: 1.28 mg/dL — ABNORMAL HIGH (ref 0.61–1.24)
GFR, Estimated: 52 mL/min — ABNORMAL LOW (ref >60)
Glucose, Bld: 216 mg/dL — ABNORMAL HIGH (ref 70–99)
Potassium: 4.4 mmol/L (ref 3.5–5.1)
Sodium: 138 mmol/L (ref 135–145)
Total Bilirubin: 0.4 mg/dL (ref 0.3–1.2)
Total Protein: 7.3 g/dL (ref 6.5–8.1)

## 2020-08-06 LAB — LACTIC ACID, PLASMA: Lactic Acid, Venous: 1.5 mmol/L (ref 0.5–1.9)

## 2020-08-06 LAB — GLUCOSE, CAPILLARY
Glucose-Capillary: 175 mg/dL — ABNORMAL HIGH (ref 70–99)
Glucose-Capillary: 193 mg/dL — ABNORMAL HIGH (ref 70–99)

## 2020-08-06 MED ORDER — AMIODARONE HCL 200 MG PO TABS
200.0000 mg | ORAL_TABLET | Freq: Every day | ORAL | Status: DC
  Administered 2020-08-07 – 2020-08-08 (×2): 200 mg via ORAL
  Filled 2020-08-06 (×2): qty 1

## 2020-08-06 MED ORDER — SODIUM CHLORIDE 0.9 % IV SOLN
2.0000 g | Freq: Two times a day (BID) | INTRAVENOUS | Status: DC
  Administered 2020-08-06 – 2020-08-08 (×4): 2 g via INTRAVENOUS
  Filled 2020-08-06 (×5): qty 2

## 2020-08-06 MED ORDER — INSULIN ASPART 100 UNIT/ML ~~LOC~~ SOLN
0.0000 [IU] | Freq: Three times a day (TID) | SUBCUTANEOUS | Status: DC
  Administered 2020-08-07: 2 [IU] via SUBCUTANEOUS
  Administered 2020-08-08: 3 [IU] via SUBCUTANEOUS

## 2020-08-06 MED ORDER — PIPERACILLIN-TAZOBACTAM 3.375 G IVPB 30 MIN
3.3750 g | Freq: Once | INTRAVENOUS | Status: AC
  Administered 2020-08-06: 3.375 g via INTRAVENOUS
  Filled 2020-08-06: qty 50

## 2020-08-06 MED ORDER — VANCOMYCIN HCL 750 MG/150ML IV SOLN
750.0000 mg | Freq: Two times a day (BID) | INTRAVENOUS | Status: DC
  Administered 2020-08-07 – 2020-08-08 (×4): 750 mg via INTRAVENOUS
  Filled 2020-08-06 (×5): qty 150

## 2020-08-06 MED ORDER — MORPHINE SULFATE (PF) 2 MG/ML IV SOLN: 2.0000 mg | INTRAVENOUS | Status: DC | PRN

## 2020-08-06 MED ORDER — VANCOMYCIN HCL IN DEXTROSE 1-5 GM/200ML-% IV SOLN
1000.0000 mg | INTRAVENOUS | Status: AC
  Administered 2020-08-06 (×2): 1000 mg via INTRAVENOUS
  Filled 2020-08-06 (×2): qty 200

## 2020-08-06 MED ORDER — METOPROLOL TARTRATE 50 MG PO TABS
50.0000 mg | ORAL_TABLET | Freq: Two times a day (BID) | ORAL | Status: DC
  Administered 2020-08-06 – 2020-08-08 (×4): 50 mg via ORAL
  Filled 2020-08-06 (×4): qty 1

## 2020-08-06 MED ORDER — POLYETHYLENE GLYCOL 3350 17 G PO PACK: 17.0000 g | PACK | Freq: Every day | ORAL | Status: DC | PRN

## 2020-08-06 MED ORDER — HEPARIN (PORCINE) 25000 UT/250ML-% IV SOLN
1200.0000 [IU]/h | INTRAVENOUS | Status: AC
  Administered 2020-08-06 – 2020-08-07 (×2): 1200 [IU]/h via INTRAVENOUS
  Filled 2020-08-06 (×4): qty 250

## 2020-08-06 MED ORDER — INSULIN ASPART 100 UNIT/ML ~~LOC~~ SOLN: 0.0000 [IU] | Freq: Every day | SUBCUTANEOUS | Status: DC

## 2020-08-06 MED ORDER — ACETAMINOPHEN 325 MG PO TABS
650.0000 mg | ORAL_TABLET | Freq: Four times a day (QID) | ORAL | Status: DC | PRN
  Administered 2020-08-07: 650 mg via ORAL
  Filled 2020-08-06: qty 2

## 2020-08-06 MED ORDER — VITAMIN B-12 1000 MCG PO TABS
1000.0000 ug | ORAL_TABLET | Freq: Every day | ORAL | Status: DC
  Administered 2020-08-07 – 2020-08-08 (×2): 1000 ug via ORAL
  Filled 2020-08-06 (×2): qty 1

## 2020-08-06 MED ORDER — CO Q 10 100 MG PO CAPS: 100.0000 mg | ORAL_CAPSULE | Freq: Every day | ORAL | Status: DC

## 2020-08-06 MED ORDER — AMITRIPTYLINE HCL 50 MG PO TABS
50.0000 mg | ORAL_TABLET | Freq: Every day | ORAL | Status: DC
  Administered 2020-08-06 – 2020-08-07 (×2): 50 mg via ORAL
  Filled 2020-08-06 (×3): qty 1

## 2020-08-06 MED ORDER — DIAZEPAM 5 MG PO TABS: 5.0000 mg | ORAL_TABLET | Freq: Every evening | ORAL | Status: DC | PRN

## 2020-08-06 MED ORDER — PREGABALIN 100 MG PO CAPS
300.0000 mg | ORAL_CAPSULE | Freq: Two times a day (BID) | ORAL | Status: DC
  Administered 2020-08-06 – 2020-08-08 (×4): 300 mg via ORAL
  Filled 2020-08-06 (×4): qty 3

## 2020-08-06 MED ORDER — ACETAMINOPHEN 650 MG RE SUPP: 650.0000 mg | Freq: Four times a day (QID) | RECTAL | Status: DC | PRN

## 2020-08-06 MED ORDER — ATORVASTATIN CALCIUM 10 MG PO TABS
10.0000 mg | ORAL_TABLET | Freq: Every day | ORAL | Status: DC
  Administered 2020-08-07 – 2020-08-08 (×2): 10 mg via ORAL
  Filled 2020-08-06 (×2): qty 1

## 2020-08-06 NOTE — ED Notes (Signed)
Report provided to [facility] for transport to [facility]

## 2020-08-06 NOTE — Progress Notes (Signed)
ANTICOAGULATION CONSULT NOTE - Initial Consult  Pharmacy Consult for IV heparin (on Xarelto PTA) Indication: atrial fibrillation  Allergies  Allergen Reactions  . Cymbalta [Duloxetine Hcl] Nausea And Vomiting and Other (See Comments)    PATIENT STATED THAT HE FELT LIKE HE "WAS GOING TO DIE" Rapid drop in blood pressure; sent him to the ER X2  . Neurontin [Gabapentin] Other (See Comments)    "Makes me goofy, keeps me off balance, clouds my thinking.."  . Keppra [Levetiracetam] Nausea And Vomiting    Patient Measurements: Height: 6' (182.9 cm) Weight: 92 kg (202 lb 13.2 oz) IBW/kg (Calculated) : 77.6 Heparin Dosing Weight: 92 kg  Vital Signs: Temp: 97.6 F (36.4 C) (10/10 1659) Temp Source: Oral (10/10 1659) BP: 128/74 (10/10 1659) Pulse Rate: 58 (10/10 1659)  Labs: Recent Labs    08/06/20 1121  HGB 11.3*  HCT 34.8*  PLT 207  CREATININE 1.28*    Estimated Creatinine Clearance: 49.7 mL/min (A) (by C-G formula based on SCr of 1.28 mg/dL (H)).   Medical History: Past Medical History:  Diagnosis Date  . AAA (abdominal aortic aneurysm) ([place])    a. s/p repair 2009.  [name] Acid reflux    takes Prilosec and Protonix daily  . Arthritis    BacK  . CAD in native artery    a. a. prior inf MI 1993. b. PCI to LAD 05/1997. c. recurrent inferolateral MI complicated by V. fib arrest in 04/1998, prior PCI to OM1. d. 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  [name] Chronic combined systolic and diastolic CHF (congestive heart failure) ([place])   . Complication of anesthesia    -years ago hair fell out.;ileus after 2 of his surgeries  . Dry skin   . Foot drop, bilateral   . Hypercholesteremia   . Hypertension   . Ileus ([place])    After AAA  . Incisional hernia    "small, from AAA"  . Ischemic cardiomyopathy   . MI (myocardial infarction) ([place])   . 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 OSA on CPAP    uses CPAP--sleep study done at least 50yrs ago  . PAF (paroxysmal atrial fibrillation) ([place])   . Pain     back pain chronic- seen at pain clinic  . Plantar fasciitis    bilatetral  . Thrombocytopenia ([place])   . Type II diabetes mellitus ([facility])     Medications:  Scheduled:  . [START ON 08/07/2020] amiodarone  200 mg Oral Daily  . amitriptyline  50 mg Oral QHS  . [START ON 08/07/2020] atorvastatin  10 mg Oral Daily  . [START ON 08/07/2020] insulin aspart  0-15 Units Subcutaneous TID WC  . insulin aspart  0-5 Units Subcutaneous QHS  . metoprolol tartrate  50 mg Oral BID  . pregabalin  300 mg Oral BID  . [START ON 08/07/2020] vitamin B-12  1,000 mcg Oral Daily   Infusions:  . ceFEPime (MAXIPIME) IV    . [START ON 08/07/2020] vancomycin      Assessment: 79 yo male to be admitted with toe infection started on IV abx's. Patient was on Xarelto PTA with last dose of 20mg  once daily reported given 10/9 at 1930. To transition to IV heparin while patient is inpatient for time being. Baseline CBC and SCr already done.   Goal of Therapy:  Heparin level 0.3-0.7 units/ml aPTT 66-102 seconds Monitor platelets by anticoagulation protocol: Yes   Plan:   NO IV heparin bolus then  Start  IV heparin at 1200 units/hr at 2000  Check aPTT and heparin level 8 hours after start of IV heparin  Daily CBC  Kara Mead 08/06/2020,6:26 PM

## 2020-08-06 NOTE — Progress Notes (Signed)
Pharmacy Antibiotic Note  [name] is a 79 y.o. male admitted on 08/06/2020 with pneumonia.  Pharmacy has been consulted for cefepime dosing. Adding to vanc already started per Rx  Plan: Cefepime 2g IV q12 per current renal function  Height: 6' (182.9 cm) Weight: 92 kg (202 lb 13.2 oz) IBW/kg (Calculated) : 77.6  Temp (24hrs), Avg:97.9 F (36.6 C), Min:97.6 F (36.4 C), Max:98 F (36.7 C)  Recent Labs  Lab 08/06/20 1121  WBC 4.3  CREATININE 1.28*  LATICACIDVEN 1.5    Estimated Creatinine Clearance: 49.7 mL/min (A) (by C-G formula based on SCr of 1.28 mg/dL (H)).    Allergies  Allergen Reactions  . Cymbalta [Duloxetine Hcl] Nausea And Vomiting and Other (See Comments)    PATIENT STATED THAT HE FELT LIKE HE "WAS GOING TO DIE" Rapid drop in blood pressure; sent him to the ER X2  . Neurontin [Gabapentin] Other (See Comments)    "Makes me goofy, keeps me off balance, clouds my thinking.."  . Keppra [Levetiracetam] Nausea And Vomiting     Thank you for allowing pharmacy to be a part of this patient's care.  Kara Mead 08/06/2020 6:19 PM

## 2020-08-06 NOTE — ED Notes (Signed)
Pt here for wound recheck from 9/22, healing laceration plantar surface of crease of right 1st toe, no drainage visible.  2cm x 1cm blistering right second toe.  Redness visible surrounding wound margins.  Denies pain

## 2020-08-06 NOTE — ED Notes (Signed)
Patient transported to X-ray 

## 2020-08-06 NOTE — Progress Notes (Signed)
Pharmacy Antibiotic Note  [name] is a 79 y.o. male admitted on 08/06/2020 with R great toe cellulitis.  Pharmacy has been consulted for Vancomycin dosing. Also given a dose of Zosyn at [facility].  Discharged home with Keflex on 09/22 however did not finish entire course as he began having a cough and was diagnosed with pneumonia after a negative Covid test.  He was placed on Levaquin and has subsequently finished 5 days of this.   Plan: Vancomycin 2gm IV loading dose then 750mg  IV q12h Will f/u renal function, micro data, and pt's clinical condition Vanc levels prn   Height: 6' (182.9 cm) Weight: 92 kg (202 lb 13.2 oz) IBW/kg (Calculated) : 77.6  Temp (24hrs), Avg:98 F (36.7 C), Min:97.9 F (36.6 C), Max:98 F (36.7 C)  Recent Labs  Lab 08/06/20 1121  WBC 4.3  CREATININE 1.28*  LATICACIDVEN 1.5    Estimated Creatinine Clearance: 49.7 mL/min (A) (by C-G formula based on SCr of 1.28 mg/dL (H)).    Allergies  Allergen Reactions  . Cymbalta [Duloxetine Hcl] Nausea And Vomiting and Other (See Comments)    PATIENT STATED THAT HE FELT LIKE HE "WAS GOING TO DIE" Rapid drop in blood pressure; sent him to the ER X2  . Neurontin [Gabapentin] Other (See Comments)    "Makes me goofy, keeps me off balance, clouds my thinking.."  . Keppra [Levetiracetam] Nausea And Vomiting    Antimicrobials this admission: 10/10 Vancomycin >>  10/10 Zosyn x1  Microbiology results: 10/10 BCx:   Thank you for allowing pharmacy to be a part of this patient's care.  Sherlon Handing, PharmD, BCPS Please see amion for complete clinical pharmacist phone list 08/06/2020 2:51 PM

## 2020-08-06 NOTE — ED Triage Notes (Signed)
Pt here for wound recheck, seen here on sept 22 for initial injury, stitches removed after 10 days, pt rechecked 2 days ago due to wound opening again, was instructed to recheck in 2 days.  Completed first course of antibiotics, was prescribed additional antibiotics 2 days ago, but has not picked up prescription yet.

## 2020-08-06 NOTE — ED Provider Notes (Signed)
[facility] EMERGENCY DEPARTMENT Provider Note   CSN: [id number] Arrival date & time: 08/06/20  4098     History Chief Complaint  Patient presents with  . Wound Check    [name] is a 79 y.o. male.  The history is provided by the patient, the spouse and medical records.  Wound Check This is a new problem. The current episode started more than 1 week ago. The problem occurs constantly. The problem has been gradually worsening. Pertinent negatives include no chest pain, no abdominal pain, no headaches and no shortness of breath. Nothing aggravates the symptoms. Nothing relieves the symptoms. Treatments tried: antibiotics. The treatment provided no relief.       Past Medical History:  Diagnosis Date  . AAA (abdominal aortic aneurysm) ([place])    a. s/p repair 2009.  [name] Acid reflux    takes Prilosec and Protonix daily  . Arthritis    BacK  . CAD in native artery    a. a. prior inf MI 1993. b. PCI to LAD 05/1997. c. recurrent inferolateral MI complicated by V. fib arrest in 04/1998, prior PCI to OM1. d. 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  [name] Chronic combined systolic and diastolic CHF (congestive heart failure) ([place])   . Complication of anesthesia    -years ago hair fell out.;ileus after 2 of his surgeries  . Dry skin   . Foot drop, bilateral   . Hypercholesteremia   . Hypertension   . Ileus ([place])    After AAA  . Incisional hernia    "small, from AAA"  . Ischemic cardiomyopathy   . MI (myocardial infarction) ([place])   . 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 OSA on CPAP    uses CPAP--sleep study done at least 72yrs ago  . PAF (paroxysmal atrial fibrillation) ([place])   . Pain    back pain chronic- seen at pain clinic  . Plantar fasciitis    bilatetral  . Thrombocytopenia ([place])   . Type II diabetes mellitus [facility])     Patient Active Problem List   Diagnosis Date Noted  . Ileus ([place]) 02/13/2020  . Acute colitis 02/08/2020  . Hypokalemia 07/08/2019  . Acute metabolic  encephalopathy   . Orthostatic dizziness   . Postural dizziness with near syncope 07/04/2019  . Abdominal distention, non-gaseous 07/04/2019  . Change in mental status 07/04/2019  . Small bowel obstruction ([place]) 07/04/2019  . Postural dizziness with presyncope 05/09/2018  . Hypotension 05/08/2018  . On amiodarone therapy 08/26/2017  . Near syncope 06/11/2017  . Hypoxia   . Pneumonia of left lower lobe due to infectious organism   . Gastroenteritis and colitis, viral 04/27/2016  . Orthostasis 04/26/2016  . AKI (acute kidney injury) ([place]) 04/26/2016  . Lumbar radiculopathy, chronic 01/16/2016  . Hematoma of left kidney 11/01/2015  . Hyperlipidemia 12/12/2014  . SIRS (systemic inflammatory response syndrome) ([place]) 12/12/2014  . Influenza due to identified novel influenza A virus with other respiratory manifestations 11/07/2013  . Long term current use of anticoagulant therapy 09/08/2013    Class: Chronic  . Coronary artery disease involving native coronary artery of native heart with angina pectoris ([place]) 08/16/2013    Class: Chronic  . Chronic combined systolic and diastolic heart failure ([facility]) 08/16/2013    Class: Chronic  . Paroxysmal atrial fibrillation with RVR ([place]) 08/13/2013  . Paralytic ileus ([place]) 05/07/2012  . Nausea and vomiting 01/01/2012  . DM (diabetes mellitus), type 2, uncontrolled w/neurologic  complication ([place]) 54/62/7035  . Hypertension   . AAA (abdominal aortic aneurysm) ([place])   . Acid reflux   . Sleep apnea     Past Surgical History:  Procedure Laterality Date  . ABDOMINAL AORTIC ANEURYSM REPAIR    . BACK SURGERY  2012-2013 X 3   Miminal Invasive x 3in [place].  [name] CARDIAC CATHETERIZATION  2009/2012  . CARDIOVERSION N/A 04/12/2015   Procedure: CARDIOVERSION;  Surgeon: Sueanne Margarita, MD;  Location: [facility];  Service: Cardiovascular;  Laterality: N/A;  . CARDIOVERSION N/A 12/26/2016   Procedure: CARDIOVERSION;  Surgeon: Sueanne Margarita, MD;  Location: [facility]  ENDOSCOPY;  Service: Cardiovascular;  Laterality: N/A;  . COLONOSCOPY    . CORONARY ANGIOPLASTY WITH STENT PLACEMENT  1992; 1997  . LAPAROSCOPIC CHOLECYSTECTOMY    . LUMBAR LAMINECTOMY/DECOMPRESSION MICRODISCECTOMY Right 01/16/2016   Procedure: Right Lumbar five-Sacral one Laminectomy;  Surgeon: Kristeen Miss, MD;  Location: [facility] NEURO [facility]S;  Service: Neurosurgery;  Laterality: Right;  Right L5-S1 Laminectomy       Family History  Problem Relation Age of Onset  . Diabetes Mother   . Coronary artery disease Mother   . Diabetes Father   . Arthritis Father   . Rheum arthritis Father   . Coronary artery disease Father     Social History   Tobacco Use  . Smoking status: Former Smoker    Packs/day: 1.00    Years: 33.00    Pack years: 33.00    Types: Cigarettes    Quit date: 01/01/1992    Years since quitting: 28.6  . Smokeless tobacco: Never Used  Vaping Use  . Vaping Use: Never used  Substance Use Topics  . Alcohol use: Yes    Comment: 6/30;2017 "GLASS OF WINE Q COUPLE MONTHS, IF THAT"  . Drug use: No    Home Medications Prior to Admission medications   Medication Sig Start Date End Date Taking? Authorizing Provider  amiodarone (PACERONE) 200 MG tablet Take 1 tablet (200 mg total) by mouth daily. 03/31/20   Belva Crome, MD  amitriptyline (ELAVIL) 25 MG tablet Take 25 mg by mouth at bedtime.  12/11/18   [provider]  atorvastatin (LIPITOR) 10 MG tablet Take 10 mg by mouth daily.    [provider]  cephALEXin (KEFLEX) 500 MG capsule Take 1 capsule (500 mg total) by mouth 3 (three) times daily for 7 days. 08/04/20 08/11/20  Eustaquio Maize, PA-C  Coenzyme Q10 (CO Q 10) 100 MG CAPS Take 100 mg by mouth daily.    [provider]  diazepam (VALIUM) 5 MG tablet Take 5 mg by mouth at bedtime as needed for anxiety.  10/31/15   [provider]  empagliflozin (JARDIANCE) 10 MG TABS tablet Take 10 mg by mouth daily with breakfast.     [provider]  furosemide (LASIX) 40 MG tablet Take 40 mg by mouth daily as needed for fluid.  01/11/20   [provider]  glimepiride (AMARYL) 1 MG tablet Take 1 mg by mouth daily with breakfast.    [provider]  metFORMIN (GLUCOPHAGE-XR) 500 MG 24 hr tablet Take 500-1,000 mg by mouth See admin instructions. 1,000 mg in the morning and 500 mg in the evening 10/12/15   [provider]  metoprolol tartrate (LOPRESSOR) 50 MG tablet Take 1 tablet by mouth twice daily 06/30/20   Belva Crome, MD  Multiple Vitamins-Minerals (ONE-A-DAY MENS 50+ ADVANTAGE) TABS Take 1 tablet by mouth daily.  [provider]  Olopatadine HCl (PATADAY OP) Place 1 drop into both eyes daily.    [provider]  omeprazole (PRILOSEC) 20 MG capsule Take 20 mg by mouth daily. 09/14/12   [provider]  oxyCODONE-acetaminophen (PERCOCET/ROXICET) 5-325 MG tablet Take 1 tablet by mouth every 6 (six) hours as needed (pain).     [provider]  pregabalin (LYRICA) 300 MG capsule Take 1 capsule by mouth 2 (two) times a day. 02/16/19   [provider]  rivaroxaban (XARELTO) 20 MG TABS tablet Take 20 mg by mouth daily with supper.    [provider]  trolamine salicylate (ASPERCREME) 10 % cream Apply 1 application topically as needed for muscle pain.    [provider]  vitamin B-12 (CYANOCOBALAMIN) 1000 MCG tablet Take 1,000 mcg by mouth daily.     [provider]    Allergies    Cymbalta [duloxetine hcl], Neurontin [gabapentin], and Keppra [levetiracetam]  Review of Systems   Review of Systems  Constitutional: Negative for chills, fatigue and fever.  HENT: Negative for congestion.   Respiratory: Negative for cough, chest tightness, shortness of breath and wheezing.   Cardiovascular: Negative for chest pain, palpitations and leg swelling.  Gastrointestinal: Negative for abdominal pain, constipation, diarrhea, nausea and vomiting.   Genitourinary: Negative for flank pain.  Musculoskeletal: Negative for back pain and neck pain.  Skin: Positive for color change, rash and wound.  Neurological: Negative for light-headedness and headaches.  Psychiatric/Behavioral: Negative for agitation.  All other systems reviewed and are negative.   Physical Exam Updated Vital Signs BP (!) 124/57 (BP Location: Right Arm)   Pulse 86   Temp 98 F (36.7 C) (Oral)   Resp 18   Ht 6' (1.829 m)   Wt 92 kg   SpO2 97%   BMI 27.51 kg/m   Physical Exam Vitals and nursing note reviewed.  Constitutional:      General: He is not in acute distress.    Appearance: He is well-developed. He is not ill-appearing, toxic-appearing or diaphoretic.  HENT:     Head: Normocephalic and atraumatic.     Mouth/Throat:     Mouth: Mucous membranes are moist.  Eyes:     Extraocular Movements: Extraocular movements intact.     Conjunctiva/sclera: Conjunctivae normal.  Cardiovascular:     Rate and Rhythm: Normal rate and regular rhythm.     Heart sounds: No murmur heard.   Pulmonary:     Effort: Pulmonary effort is normal. No respiratory distress.     Breath sounds: Normal breath sounds. No wheezing, rhonchi or rales.  Chest:     Chest wall: No tenderness.  Abdominal:     General: Abdomen is flat.     Palpations: Abdomen is soft.     Tenderness: There is no abdominal tenderness. There is no guarding.  Musculoskeletal:        General: Tenderness present.     Cervical back: Neck supple.     Right lower leg: No edema.     Left lower leg: No edema.     Right foot: Swelling and tenderness present.     Comments: Great and second toe on her right foot have erythema, tenderness, swelling, and foul smell.  Concern for worsening infection despite antibiotics.  Skin:    General: Skin is warm and dry.     Capillary Refill: Capillary refill takes less than 2 seconds.     Findings: Erythema present.  Neurological:  General: No focal deficit present.      Mental Status: He is alert. Mental status is at baseline.     Sensory: Sensory deficit (at baseline) present.  Psychiatric:        Mood and Affect: Mood normal.     ED Results / Procedures / Treatments   Labs (all labs ordered are listed, but only abnormal results are displayed) Labs Reviewed  CBC WITH DIFFERENTIAL/PLATELET - Abnormal; Notable for the following components:      Result Value   RBC 3.64 (*)    Hemoglobin 11.3 (*)    HCT 34.8 (*)    Abs Immature Granulocytes 0.13 (*)    All other components within normal limits  COMPREHENSIVE METABOLIC PANEL - Abnormal; Notable for the following components:   Glucose, Bld 216 (*)    Creatinine, Ser 1.28 (*)    Albumin 3.1 (*)    GFR, Estimated 52 (*)    All other components within normal limits  RESPIRATORY PANEL BY RT PCR (FLU A&B, COVID)  CULTURE, BLOOD (ROUTINE X 2)  CULTURE, BLOOD (ROUTINE X 2)  LACTIC ACID, PLASMA    EKG None  Radiology DG Foot Complete Right  Result Date: 08/06/2020 CLINICAL DATA:  Worsening toe and foot infection. Failing antibiotics. Diabetes. EXAM: RIGHT FOOT COMPLETE - 3+ VIEW COMPARISON:  Radiographs 08/04/2020 and 07/19/2020. FINDINGS: The bones are demineralized. No evidence of acute fracture or dislocation. There is irregularity of the plantar base of the distal 1st phalanx on the lateral view which may relate to the previously demonstrated dislocation. Stable degenerative changes at the metatarsophalangeal and interphalangeal joints of the great toe. No bone destruction, foreign body or soft tissue emphysema identified. There may be some forefoot soft tissue swelling. IMPRESSION: Possible plantar plate avulsion fracture from the distal 1st phalanx related to previously demonstrated dislocation. No evidence of osteomyelitis or acute osseous findings. Stable degenerative changes. Electronically Signed   By: Richardean Sale M.D.   On: 08/06/2020 12:40   DG Toe 2nd Right  Result Date:  08/04/2020 CLINICAL DATA:  Re-injury second toe.  Bleeding. EXAM: RIGHT SECOND TOE COMPARISON:  None. FINDINGS: Soft tissue swelling is present in the distal digit. Joints are located. No acute or healing fractures are present. Tissues are otherwise unremarkable. IMPRESSION: Soft tissue swelling of the distal digit without underlying fracture. Electronically Signed   By: San Morelle M.D.   On: 08/04/2020 22:06    Procedures Procedures (including critical care time)  CRITICAL CARE Performed by: Gwenyth Allegra Arelie Kuzel Total critical care time: 30 minutes Critical care time was exclusive of separately billable procedures and treating other patients. Critical care was necessary to treat or prevent imminent or life-threatening deterioration. Critical care was time spent personally by me on the following activities: development of treatment plan with patient and/or surrogate as well as nursing, discussions with consultants, evaluation of patient's response to treatment, examination of patient, obtaining history from patient or surrogate, ordering and performing treatments and interventions, ordering and review of laboratory studies, ordering and review of radiographic studies, pulse oximetry and re-evaluation of patient's condition.   Medications Ordered in ED Medications  vancomycin (VANCOREADY) IVPB 750 mg/150 mL (has no administration in time range)  piperacillin-tazobactam (ZOSYN) IVPB 3.375 g (0 g Intravenous Stopped 08/06/20 1215)  vancomycin (VANCOCIN) IVPB 1000 mg/200 mL premix (0 mg Intravenous Stopped 08/06/20 1447)    ED Course  I have reviewed the triage vital signs and the nursing notes.  Pertinent labs & imaging results that  were available during my care of the patient were reviewed by me and considered in my medical decision making (see chart for details).    MDM Rules/Calculators/A&P                          DEQUAN KINDRED is a 79 y.o. male with a past medical history  significant for diabetes, paroxysmal A. fib on Xarelto, AAA, CAD status post MI, hypertension, hypercholesterolemia, recent pneumonia, and current right foot infection who presents with worsening smell and appearance of right foot.  Patient reports that around 1 month ago, patient injured his right great toe.  He had sutures placed.  He reports that after the fact, they were removed and it started draining.  He was started on Keflex at that time.  He then had a pneumonia and was treated with Levaquin.  He says that 2 days ago, his foot was draining more and looking more red so he came to the emergency department.  He was found to have worsened foot infection and was started back on Keflex which she did not fully complete previously.  He says that he has been taking the leftover Keflex from his initial prescription and is going to fill the other one today but the foot is starting to smell more and there is more redness surrounding the toes.  He denies systemic signs of infection with no fevers, chills, other leg pain, leg swelling, cough, congestion.  He does have neuropathies in his feet and cannot feel pain there typically.  Family is concerned with how it is looking at is not getting better despite now 3 days of antibiotics again.  On exam, patient does have erythema, some induration, and warmth of the great toe and second toe primarily.  There is also some tenderness at the base of the toes.  No erythema to the ankle or rest of the foot.  Palpable DP pulse.  Patient has decreased sensation with side his baseline.  He can move his toes though.  Lungs clear and chest nontender.  Abdomen nontender.  Vital signs reassuring on arrival.  Clinically I am concerned he is failing outpatient antibiotics for diabetic toe infection.  Given the worsened appearance and smell, I am concerned this is progressing.  He was told to return in 2 days if it did not improve and I am concerned this is the case.  We will give IV  antibiotics, get an x-ray to look for osteomyelitis development, and get other screening labs.  Anticipate admission for diabetic foot infection that is worsening despite outpatient antibiotics.  1:10 PM Foot x-ray shows no evidence of osteomyelitis.  Given the failing outpatient antibiotics for diabetic foot infection with new foul smell, he will be mated for IV antibiotics.  He was already given antibiotics.  No evidence of acute systemic sepsis at this time.  No leukocytosis.  No lactic acidosis.  X-ray did show possible plantar plate avulsion fracture related to previously demonstrate dislocation.  Stable degenerative change otherwise.  Patient will be admitted to medicine service for further management with antibiotics.   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 Cellulitis of toe of right foot   2. Cellulitis in diabetic foot ([place])     Disposition: Admit  This note was prepared with assistance of Dragon voice recognition software. Occasional wrong-word or sound-a-like  substitutions may have occurred due to the inherent limitations of voice recognition software.     Chariah Bailey, Gwenyth Allegra, MD 08/06/20 [phone]

## 2020-08-06 NOTE — H&P (Addendum)
History and Physical    [name] HGD:[id number] DOB: 11/06/1940 DOA: 08/06/2020  PCP: [name], MD  Patient coming from: Home  Chief Complaint: R toe infection  HPI: [name] is a 79 y.o. male with medical history significant of afib, CAD, combined systolic/diastolic CHF, HTN, DM2 with neuropathy who initially presented last month with R great toe injury sustained after falling and twisting his toe, resulting in dislocation which also required stitches. Stiches were removed one week later however wound reopened. Pt was given course of keflex. Wound continued to worsen and appeared to spread to adjacent second toe. A second course of keflex was prescribed. Patient returned to ED on 10/10 for wound check. Currently denies chest pain, sob, nausea. Denies florid drainage from foot, however states his toes feel "moist."  ED Course: In the Ed, pt was found to have a WBC of 4.3. Pt was afebrile. R foot xray demonstrated possible plantar avulsion fracture from the distal 1st phalanx possibly related to previous dislocation. No evidence of osteomyelitis seen on plain film xray. Pt was started on empiric vanc and cefepime. Hospitalist consulted for consideration for medical admission  Review of Systems:  Review of Systems  Constitutional: Negative for chills, fever and malaise/fatigue.  HENT: Negative for ear discharge, ear pain and nosebleeds.   Eyes: Negative for double vision, photophobia and pain.  Respiratory: Negative for cough and shortness of breath.   Cardiovascular: Negative for chest pain, palpitations and claudication.  Gastrointestinal: Negative for abdominal pain, heartburn, nausea and vomiting.  Genitourinary: Negative for dysuria, frequency and urgency.  Musculoskeletal: Negative for back pain, joint pain, myalgias and neck pain.  Skin:       Redness, swelling over R first and second toes  Neurological: Negative for tingling, tremors, seizures and weakness.   Psychiatric/Behavioral: Negative for hallucinations and memory loss. The patient is not nervous/anxious.     Past Medical History:  Diagnosis Date  . AAA (abdominal aortic aneurysm) ([place])    a. s/p repair 2009.  [name] Acid reflux    takes Prilosec and Protonix daily  . Arthritis    BacK  . CAD in native artery    a. a. prior inf MI 1993. b. PCI to LAD 05/1997. c. recurrent inferolateral MI complicated by V. fib arrest in 04/1998, prior PCI to OM1. d. 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  [name] Chronic combined systolic and diastolic CHF (congestive heart failure) ([place])   . Complication of anesthesia    -years ago hair fell out.;ileus after 2 of his surgeries  . Dry skin   . Foot drop, bilateral   . Hypercholesteremia   . Hypertension   . Ileus ([place])    After AAA  . Incisional hernia    "small, from AAA"  . Ischemic cardiomyopathy   . MI (myocardial infarction) ([place])   . 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 OSA on CPAP    uses CPAP--sleep study done at least 70yrs ago  . PAF (paroxysmal atrial fibrillation) ([place])   . Pain    back pain chronic- seen at pain clinic  . Plantar fasciitis    bilatetral  . Thrombocytopenia ([place])   . Type II diabetes mellitus ([place])     Past Surgical History:  Procedure Laterality Date  . ABDOMINAL AORTIC ANEURYSM REPAIR    . BACK SURGERY  2012-2013 X 3   Miminal Invasive x 3in [place].  [name] CARDIAC CATHETERIZATION  2009/2012  . CARDIOVERSION N/A  04/12/2015   Procedure: CARDIOVERSION;  Surgeon: Sueanne Margarita, MD;  Location: [facility] ENDOSCOPY;  Service: Cardiovascular;  Laterality: N/A;  . CARDIOVERSION N/A 12/26/2016   Procedure: CARDIOVERSION;  Surgeon: Sueanne Margarita, MD;  Location: [facility] ENDOSCOPY;  Service: Cardiovascular;  Laterality: N/A;  . COLONOSCOPY    . CORONARY ANGIOPLASTY WITH STENT PLACEMENT  1992; 1997  . LAPAROSCOPIC CHOLECYSTECTOMY    . LUMBAR LAMINECTOMY/DECOMPRESSION MICRODISCECTOMY Right 01/16/2016   Procedure: Right Lumbar five-Sacral one  Laminectomy;  Surgeon: Kristeen Miss, MD;  Location: [facility] NEURO ORS;  Service: Neurosurgery;  Laterality: Right;  Right L5-S1 Laminectomy     reports that he quit smoking about 28 years ago. His smoking use included cigarettes. He has a 33.00 pack-year smoking history. He has never used smokeless tobacco. He reports current alcohol use. He reports that he does not use drugs.  Allergies  Allergen Reactions  . Cymbalta [Duloxetine Hcl] Nausea And Vomiting and Other (See Comments)    PATIENT STATED THAT HE FELT LIKE HE "WAS GOING TO DIE" Rapid drop in blood pressure; sent him to the ER X2  . Neurontin [Gabapentin] Other (See Comments)    "Makes me goofy, keeps me off balance, clouds my thinking.."  . Keppra [Levetiracetam] Nausea And Vomiting    Family History  Problem Relation Age of Onset  . Diabetes Mother   . Coronary artery disease Mother   . Diabetes Father   . Arthritis Father   . Rheum arthritis Father   . Coronary artery disease Father     Prior to Admission medications   Medication Sig Start Date End Date Taking? Authorizing Provider  amiodarone (PACERONE) 200 MG tablet Take 1 tablet (200 mg total) by mouth daily. 03/31/20   Belva Crome, MD  amitriptyline (ELAVIL) 25 MG tablet Take 25 mg by mouth at bedtime.  12/11/18   [provider]  atorvastatin (LIPITOR) 10 MG tablet Take 10 mg by mouth daily.    [provider]  cephALEXin (KEFLEX) 500 MG capsule Take 1 capsule (500 mg total) by mouth 3 (three) times daily for 7 days. 08/04/20 08/11/20  Eustaquio Maize, PA-C  Coenzyme Q10 (CO Q 10) 100 MG CAPS Take 100 mg by mouth daily.    [provider]  diazepam (VALIUM) 5 MG tablet Take 5 mg by mouth at bedtime as needed for anxiety.  10/31/15   [provider]  empagliflozin (JARDIANCE) 10 MG TABS tablet Take 10 mg by mouth daily with breakfast.     [provider]  furosemide (LASIX) 40 MG tablet Take 40 mg by mouth daily as needed for fluid.   01/11/20   [provider]  glimepiride (AMARYL) 1 MG tablet Take 1 mg by mouth daily with breakfast.    [provider]  metFORMIN (GLUCOPHAGE-XR) 500 MG 24 hr tablet Take 500-1,000 mg by mouth See admin instructions. 1,000 mg in the morning and 500 mg in the evening 10/12/15   [provider]  metoprolol tartrate (LOPRESSOR) 50 MG tablet Take 1 tablet by mouth twice daily 06/30/20   Belva Crome, MD  Multiple Vitamins-Minerals (ONE-A-DAY MENS 50+ ADVANTAGE) TABS Take 1 tablet by mouth daily.    [provider]  Olopatadine HCl (PATADAY OP) Place 1 drop into both eyes daily.    [provider]  omeprazole (PRILOSEC) 20 MG capsule Take 20 mg by mouth daily. 09/14/12   [provider]  oxyCODONE-acetaminophen (PERCOCET/ROXICET) 5-325 MG tablet Take 1 tablet by mouth every  6 (six) hours as needed (pain).     [provider]  pregabalin (LYRICA) 300 MG capsule Take 1 capsule by mouth 2 (two) times a day. 02/16/19   [provider]  rivaroxaban (XARELTO) 20 MG TABS tablet Take 20 mg by mouth daily with supper.    [provider]  trolamine salicylate (ASPERCREME) 10 % cream Apply 1 application topically as needed for muscle pain.    [provider]  vitamin B-12 (CYANOCOBALAMIN) 1000 MCG tablet Take 1,000 mcg by mouth daily.     [provider]    Physical Exam: Vitals:   08/06/20 1158 08/06/20 1405 08/06/20 1535 08/06/20 1659  BP: 126/65 136/72 128/74 128/74  Pulse: 84 64 65 (!) 58  Resp: 16 16  18   Temp:  97.9 F (36.6 C) 98 F (36.7 C) 97.6 F (36.4 C)  TempSrc:  Oral Oral Oral  SpO2: 98% 96% 97% 99%  Weight:      Height:        Constitutional: NAD, calm, comfortable Vitals:   08/06/20 1158 08/06/20 1405 08/06/20 1535 08/06/20 1659  BP: 126/65 136/72 128/74 128/74  Pulse: 84 64 65 (!) 58  Resp: 16 16  18   Temp:  97.9 F (36.6 C) 98 F (36.7 C) 97.6 F (36.4 C)  TempSrc:  Oral Oral  Oral  SpO2: 98% 96% 97% 99%  Weight:      Height:       Eyes: PERRL, lids and conjunctivae normal ENMT: Mucous membranes are moist. Posterior pharynx clear of any exudate or lesions.Normal dentition.  Neck: normal, supple, no masses, no thyromegaly Respiratory: clear to auscultation bilaterally, no wheezing, normal resp effort Cardiovascular: Regular rate and rhythm. No extremity edema. 2+ pedal pulses. No carotid bruits.  Abdomen: no tenderness, no masses palpated.  Bowel sounds positive.  Musculoskeletal: no clubbing / cyanosis. No joint deformity upper and lower extremities. Good ROM, no contractures. Normal muscle tone.  Skin: R second and first toes with diffuse patches of erythema, edema, areas or scabbing and necrosis. No active drainage at this time Neurologic: CN 2-12 grossly intact. Sensation decreased in B feet, strength intact Psychiatric: Normal judgment and insight. Alert and oriented x 3. Normal mood.    Labs on Admission: I have personally reviewed following labs and imaging studies  CBC: Recent Labs  Lab 08/06/20 1121  WBC 4.3  NEUTROABS 2.6  HGB 11.3*  HCT 34.8*  MCV 95.6  PLT 878   Basic Metabolic Panel: Recent Labs  Lab 08/06/20 1121  NA 138  K 4.4  CL 102  CO2 27  GLUCOSE 216*  BUN 19  CREATININE 1.28*  CALCIUM 9.0   GFR: Estimated Creatinine Clearance: 49.7 mL/min (A) (by C-G formula based on SCr of 1.28 mg/dL (H)). Liver Function Tests: Recent Labs  Lab 08/06/20 1121  AST 17  ALT 18  ALKPHOS 72  BILITOT 0.4  PROT 7.3  ALBUMIN 3.1*   No results for input(s): LIPASE, AMYLASE in the last 168 hours. No results for input(s): AMMONIA in the last 168 hours. Coagulation Profile: No results for input(s): INR, PROTIME in the last 168 hours. Cardiac Enzymes: No results for input(s): CKTOTAL, CKMB, CKMBINDEX, TROPONINI in the last 168 hours. BNP (last 3 results) No results for input(s): PROBNP in the last 8760 hours. HbA1C: No results for  input(s): HGBA1C in the last 72 hours. CBG: No results for input(s): GLUCAP in the last 168 hours. Lipid Profile: No results for input(s): CHOL, HDL,  LDLCALC, TRIG, CHOLHDL, LDLDIRECT in the last 72 hours. Thyroid Function Tests: No results for input(s): TSH, T4TOTAL, FREET4, T3FREE, THYROIDAB in the last 72 hours. Anemia Panel: No results for input(s): VITAMINB12, FOLATE, FERRITIN, TIBC, IRON, RETICCTPCT in the last 72 hours. Urine analysis:    Component Value Date/Time   COLORURINE YELLOW 02/08/2020 1624   APPEARANCEUR CLEAR 02/08/2020 1624   LABSPEC 1.011 02/08/2020 1624   PHURINE 6.0 02/08/2020 1624   GLUCOSEU >=500 (A) 02/08/2020 1624   HGBUR LARGE (A) 02/08/2020 1624   BILIRUBINUR NEGATIVE 02/08/2020 1624   KETONESUR NEGATIVE 02/08/2020 1624   PROTEINUR NEGATIVE 02/08/2020 1624   UROBILINOGEN 0.2 12/12/2014 0150   NITRITE NEGATIVE 02/08/2020 1624   LEUKOCYTESUR NEGATIVE 02/08/2020 1624   Sepsis Labs: !!!!!!!!!!!!!!!!!!!!!!!!!!!!!!!!!!!!!!!!!!!! @LABRCNTIP (procalcitonin:4,lacticidven:4) ) Recent Results (from the past 240 hour(s))  Respiratory Panel by RT PCR (Flu A&B, Covid) - Nasopharyngeal Swab     Status: None   Collection Time: 08/06/20 11:21 AM   Specimen: Nasopharyngeal Swab  Result Value Ref Range Status   SARS Coronavirus 2 by RT PCR NEGATIVE NEGATIVE Final    Comment: (NOTE) SARS-CoV-2 target nucleic acids are NOT DETECTED.  The SARS-CoV-2 RNA is generally detectable in upper respiratoy specimens during the acute phase of infection. The lowest concentration of SARS-CoV-2 viral copies this assay can detect is 131 copies/mL. A negative result does not preclude SARS-Cov-2 infection and should not be used as the sole basis for treatment or other patient management decisions. A negative result may occur with  improper specimen collection/handling, submission of specimen other than nasopharyngeal swab, presence of viral mutation(s) within the areas targeted by  this assay, and inadequate number of viral copies (<131 copies/mL). A negative result must be combined with clinical observations, patient history, and epidemiological information. The expected result is Negative.  Fact Sheet for Patients:  PinkCheek.be  Fact Sheet for Healthcare Providers:  GravelBags.it  This test is no t yet approved or cleared by the [place] FDA and  has been authorized for detection and/or diagnosis of SARS-CoV-2 by FDA under an Emergency Use Authorization (EUA). This EUA will remain  in effect (meaning this test can be used) for the duration of the COVID-19 declaration under Section 564(b)(1) of the Act, 21 U.S.C. section 360bbb-3(b)(1), unless the authorization is terminated or revoked sooner.     Influenza A by PCR NEGATIVE NEGATIVE Final   Influenza B by PCR NEGATIVE NEGATIVE Final    Comment: (NOTE) The Xpert Xpress SARS-CoV-2/FLU/RSV assay is intended as an aid in  the diagnosis of influenza from Nasopharyngeal swab specimens and  should not be used as a sole basis for treatment. Nasal washings and  aspirates are unacceptable for Xpert Xpress SARS-CoV-2/FLU/RSV  testing.  Fact Sheet for Patients: PinkCheek.be  Fact Sheet for Healthcare Providers: GravelBags.it  This test is not yet approved or cleared by the [place] FDA and  has been authorized for detection and/or diagnosis of SARS-CoV-2 by  FDA under an Emergency Use Authorization (EUA). This EUA will remain  in effect (meaning this test can be used) for the duration of the  Covid-19 declaration under Section 564(b)(1) of the Act, 21  U.S.C. section 360bbb-3(b)(1), unless the authorization is  terminated or revoked. Performed at [facility], [address] [place], [place] [address]      Radiological Exams on Admission: DG Foot Complete Right  Result  Date: 08/06/2020 CLINICAL DATA:  Worsening toe and foot infection. Failing antibiotics. Diabetes. EXAM: RIGHT FOOT COMPLETE -  3+ VIEW COMPARISON:  Radiographs 08/04/2020 and 07/19/2020. FINDINGS: The bones are demineralized. No evidence of acute fracture or dislocation. There is irregularity of the plantar base of the distal 1st phalanx on the lateral view which may relate to the previously demonstrated dislocation. Stable degenerative changes at the metatarsophalangeal and interphalangeal joints of the great toe. No bone destruction, foreign body or soft tissue emphysema identified. There may be some forefoot soft tissue swelling. IMPRESSION: Possible plantar plate avulsion fracture from the distal 1st phalanx related to previously demonstrated dislocation. No evidence of osteomyelitis or acute osseous findings. Stable degenerative changes. Electronically Signed   By: Richardean Sale M.D.   On: 08/06/2020 12:40   DG Toe 2nd Right  Result Date: 08/04/2020 CLINICAL DATA:  Re-injury second toe.  Bleeding. EXAM: RIGHT SECOND TOE COMPARISON:  None. FINDINGS: Soft tissue swelling is present in the distal digit. Joints are located. No acute or healing fractures are present. Tissues are otherwise unremarkable. IMPRESSION: Soft tissue swelling of the distal digit without underlying fracture. Electronically Signed   By: San Morelle M.D.   On: 08/04/2020 22:06    Assessment/Plan Principal Problem:   Diabetic foot infection ([place]) Active Problems:   Hypertension   DM (diabetes mellitus), type 2, uncontrolled w/neurologic complication (HCC)   Paroxysmal atrial fibrillation with RVR (HCC)   Chronic combined systolic and diastolic heart failure (HCC)   Chronic anticoagulation   Osteomyelitis (HCC)   Acquired thrombophilia ([place])   1. Diabetic foot infection in R first toe with suspected osteomyelitis 1. Pt is s/p R toe injury one month ago with dislocation at R great toe IP joint noted, xray reviewed.  Injury did require suturing 2. Pt completed multiple courses of keflex with continued worsening in wound 3. Xray R foot without evidence of osteomyelitis 4. Pt afebrile with no leukocytosis 5. Will continue on empiric vancomycin and cefepime 6. Will request MRI R foot to r/o underlying osteomyelitis. If MRI becomes positive for osteomyelitis, would consult Orthopedic Surgery at that time 7. Blood cultures obtained in ED, pending 8. Repeat CBC and CMP in AM 2. DM2 with neuropathy 1. Most recent a1c of 8.4 on 4/21 2. Will repeat a1c 3. Continue on SSI coverage as needed  4. Hold oral hyperglycemic meds while in hospital 3. Paroxysmal Afib 1. Currently rate controlled 2. Continue amiodarone and beta blocker per home regimen 3. Will check EKG 4. On xarelto prior to admit, continue on heparin gtt for now 5. [facility] as outpt 4. Chronic systolic and diastolic CHF 1. Most recent echo from 05/09/18 with EF 40-45% with grade 1 diastolic dysfunction  2. Appears euvolemic at this time 3. Would continue beta blocker, statin per home regimen 5. CAD 1. Chest pain free at this time 2. F/u on EKG per above 6. HTN 1. BP stable and controlled 2. Continue metoprolol 7. Chronic anticoagulation 1. On xarelto prior to admit, last dose was evening of 10/9 2. While in hospital, will continue on heparin gtt pending MRI incase surgery warranted   DVT prophylaxis: Heparin gtt  Code Status: Full Family Communication: Pt in room, family not at bedside  Disposition Plan: Uncertain at this time, will need PT eval  Consults called: Orthopedic Surgery Admission status: Inpatient as pt will likely require greater than 2 midnight stay for management of diabetic foot infection with osteomyelitis   Marylu Lund MD [facility] Pager On Amion  If 7PM-7AM, please contact night-coverage  08/06/2020, 5:28 PM

## 2020-08-07 ENCOUNTER — Inpatient Hospital Stay (HOSPITAL_COMMUNITY): Payer: Medicare Other

## 2020-08-07 DIAGNOSIS — L089 Local infection of the skin and subcutaneous tissue, unspecified: Secondary | ICD-10-CM | POA: Diagnosis not present

## 2020-08-07 DIAGNOSIS — E11628 Type 2 diabetes mellitus with other skin complications: Secondary | ICD-10-CM | POA: Diagnosis not present

## 2020-08-07 DIAGNOSIS — L03031 Cellulitis of right toe: Secondary | ICD-10-CM | POA: Diagnosis not present

## 2020-08-07 DIAGNOSIS — L03119 Cellulitis of unspecified part of limb: Secondary | ICD-10-CM

## 2020-08-07 DIAGNOSIS — Z7901 Long term (current) use of anticoagulants: Secondary | ICD-10-CM | POA: Diagnosis not present

## 2020-08-07 LAB — APTT
aPTT: 73 seconds — ABNORMAL HIGH (ref 24–36)
aPTT: 72 seconds — ABNORMAL HIGH (ref 24–36)

## 2020-08-07 LAB — COMPREHENSIVE METABOLIC PANEL
ALT: 17 U/L (ref 0–44)
AST: 17 U/L (ref 15–41)
Albumin: 3.2 g/dL — ABNORMAL LOW (ref 3.5–5.0)
Alkaline Phosphatase: 71 U/L (ref 38–126)
Anion gap: 10 (ref 5–15)
BUN: 21 mg/dL (ref 8–23)
CO2: 25 mmol/L (ref 22–32)
Calcium: 9.1 mg/dL (ref 8.9–10.3)
Chloride: 105 mmol/L (ref 98–111)
Creatinine, Ser: 1.17 mg/dL (ref 0.61–1.24)
GFR, Estimated: 58 mL/min — ABNORMAL LOW (ref >60)
Glucose, Bld: 178 mg/dL — ABNORMAL HIGH (ref 70–99)
Potassium: 4.1 mmol/L (ref 3.5–5.1)
Sodium: 140 mmol/L (ref 135–145)
Total Bilirubin: 0.7 mg/dL (ref 0.3–1.2)
Total Protein: 6.9 g/dL (ref 6.5–8.1)

## 2020-08-07 LAB — CBC
HCT: 34.6 % — ABNORMAL LOW (ref 39.0–52.0)
Hemoglobin: 11.3 g/dL — ABNORMAL LOW (ref 13.0–17.0)
MCH: 31.3 pg (ref 26.0–34.0)
MCHC: 32.7 g/dL (ref 30.0–36.0)
MCV: 95.8 fL (ref 80.0–100.0)
Platelets: 193 10*3/uL (ref 150–400)
RBC: 3.61 MIL/uL — ABNORMAL LOW (ref 4.22–5.81)
RDW: 14.6 % (ref 11.5–15.5)
WBC: 4.4 10*3/uL (ref 4.0–10.5)
nRBC: 0 % (ref 0.0–0.2)

## 2020-08-07 LAB — HEPARIN LEVEL (UNFRACTIONATED)
Heparin Unfractionated: 1.22 IU/mL — ABNORMAL HIGH (ref 0.30–0.70)
Heparin Unfractionated: 0.57 IU/mL (ref 0.30–0.70)

## 2020-08-07 LAB — GLUCOSE, CAPILLARY
Glucose-Capillary: 116 mg/dL — ABNORMAL HIGH (ref 70–99)
Glucose-Capillary: 117 mg/dL — ABNORMAL HIGH (ref 70–99)
Glucose-Capillary: 132 mg/dL — ABNORMAL HIGH (ref 70–99)
Glucose-Capillary: 150 mg/dL — ABNORMAL HIGH (ref 70–99)

## 2020-08-07 LAB — HEMOGLOBIN A1C
Hgb A1c MFr Bld: 7.8 % — ABNORMAL HIGH (ref 4.8–5.6)
Mean Plasma Glucose: 177.16 mg/dL

## 2020-08-07 MED ORDER — LIDOCAINE 4 % EX CREA
TOPICAL_CREAM | Freq: Three times a day (TID) | CUTANEOUS | Status: DC | PRN
  Filled 2020-08-07 (×2): qty 5

## 2020-08-07 NOTE — Progress Notes (Signed)
ANTICOAGULATION CONSULT NOTE -   Pharmacy Consult for IV heparin (on Xarelto PTA) Indication: atrial fibrillation  Allergies  Allergen Reactions  . Cymbalta [Duloxetine Hcl] Nausea And Vomiting and Other (See Comments)    PATIENT STATED THAT HE FELT LIKE HE "WAS GOING TO DIE" Rapid drop in blood pressure; sent him to the ER X2  . Neurontin [Gabapentin] Other (See Comments)    "Makes me goofy, keeps me off balance, clouds my thinking.."  . Keppra [Levetiracetam] Nausea And Vomiting    Patient Measurements: Height: 6' (182.9 cm) Weight: 92 kg (202 lb 13.2 oz) IBW/kg (Calculated) : 77.6 Heparin Dosing Weight: 92 kg  Vital Signs: Temp: 98.2 F (36.8 C) (10/11 0518) Temp Source: Oral (10/10 2119) BP: 125/72 (10/11 0518) Pulse Rate: 59 (10/11 0518)  Labs: Recent Labs    08/06/20 1121 08/07/20 0320 08/07/20 0539  HGB 11.3* 11.3*  --   HCT 34.8* 34.6*  --   PLT 207 193  --   APTT  --   --  73*  HEPARINUNFRC  --  1.22*  --   CREATININE 1.28* 1.17  --     Estimated Creatinine Clearance: 54.3 mL/min (by C-G formula based on SCr of 1.17 mg/dL).   Medical History: Past Medical History:  Diagnosis Date  . AAA (abdominal aortic aneurysm) ([place])    a. s/p repair 2009.  [name] Acid reflux    takes Prilosec and Protonix daily  . Arthritis    BacK  . CAD in native artery    a. a. prior inf MI 1993. b. PCI to LAD 05/1997. c. recurrent inferolateral MI complicated by V. fib arrest in 04/1998, prior PCI to OM1. d. 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  [name] Chronic combined systolic and diastolic CHF (congestive heart failure) ([place])   . Complication of anesthesia    -years ago hair fell out.;ileus after 2 of his surgeries  . Dry skin   . Foot drop, bilateral   . Hypercholesteremia   . Hypertension   . Ileus ([place])    After AAA  . Incisional hernia    "small, from AAA"  . Ischemic cardiomyopathy   . MI (myocardial infarction) ([place])   . 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 OSA on CPAP     uses CPAP--sleep study done at least 82yrs ago  . PAF (paroxysmal atrial fibrillation) ([place])   . Pain    back pain chronic- seen at pain clinic  . Plantar fasciitis    bilatetral  . Thrombocytopenia ([place])   . Type II diabetes mellitus (HCC)     Medications:  Scheduled:  . amiodarone  200 mg Oral Daily  . amitriptyline  50 mg Oral QHS  . atorvastatin  10 mg Oral Daily  . insulin aspart  0-15 Units Subcutaneous TID WC  . insulin aspart  0-5 Units Subcutaneous QHS  . metoprolol tartrate  50 mg Oral BID  . pregabalin  300 mg Oral BID  . vitamin B-12  1,000 mcg Oral Daily   Infusions:  . ceFEPime (MAXIPIME) IV 2 g (08/06/20 2023)  . heparin 1,200 Units/hr (08/06/20 2002)  . vancomycin 750 mg (08/07/20 0050)    Assessment: 79 yo male to be admitted with toe infection started on IV abx's. Patient was on Xarelto PTA with last dose of 20mg  once daily reported given 10/9 at 1930. To transition to IV heparin while patient is inpatient for time being. Baseline CBC and SCr already done.  08/07/2020 APTT 73 therapeutic HL 1.22 high as expected with xarelto still on board plts WNL Hgb 11.6 No bleeding noted   Goal of Therapy:  Heparin level 0.3-0.7 units/ml aPTT 66-102 seconds Monitor platelets by anticoagulation protocol: Yes   Plan:   continue IV heparin at 1200 units/hr  Check aPTT and heparin level in 8 hours   Daily CBC  Dolly Rias RPh 08/07/2020, 6:06 AM

## 2020-08-07 NOTE — Progress Notes (Signed)
ANTICOAGULATION CONSULT NOTE -   Pharmacy Consult for IV heparin (on Xarelto PTA) Indication: atrial fibrillation  Allergies  Allergen Reactions  . Cymbalta [Duloxetine Hcl] Nausea And Vomiting and Other (See Comments)    PATIENT STATED THAT HE FELT LIKE HE "WAS GOING TO DIE" Rapid drop in blood pressure; sent him to the ER X2  . Neurontin [Gabapentin] Other (See Comments)    "Makes me goofy, keeps me off balance, clouds my thinking.."  . Keppra [Levetiracetam] Nausea And Vomiting    Patient Measurements: Height: 6' (182.9 cm) Weight: 92 kg (202 lb 13.2 oz) IBW/kg (Calculated) : 77.6 Heparin Dosing Weight: 92 kg  Vital Signs: Temp: 98.2 F (36.8 C) (10/11 1806) Temp Source: Oral (10/11 1806) BP: 128/69 (10/11 1806) Pulse Rate: 65 (10/11 1806)  Labs: Recent Labs    08/06/20 1121 08/07/20 0320 08/07/20 0539 08/07/20 1911  HGB 11.3* 11.3*  --   --   HCT 34.8* 34.6*  --   --   PLT 207 193  --   --   APTT  --   --  73*  --   HEPARINUNFRC  --  1.22*  --  0.57  CREATININE 1.28* 1.17  --   --     Estimated Creatinine Clearance: 54.3 mL/min (by C-G formula based on SCr of 1.17 mg/dL).   Medical History: Past Medical History:  Diagnosis Date  . AAA (abdominal aortic aneurysm) ([place])    a. s/p repair 2009.  [name] Acid reflux    takes Prilosec and Protonix daily  . Arthritis    BacK  . CAD in native artery    a. a. prior inf MI 1993. b. PCI to LAD 05/1997. c. recurrent inferolateral MI complicated by V. fib arrest in 04/1998, prior PCI to OM1. d. 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  [name] Chronic combined systolic and diastolic CHF (congestive heart failure) ([place])   . Complication of anesthesia    -years ago hair fell out.;ileus after 2 of his surgeries  . Dry skin   . Foot drop, bilateral   . Hypercholesteremia   . Hypertension   . Ileus ([place])    After AAA  . Incisional hernia    "small, from AAA"  . Ischemic cardiomyopathy   . MI (myocardial infarction) ([place])   .  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 OSA on CPAP    uses CPAP--sleep study done at least 73yrs ago  . PAF (paroxysmal atrial fibrillation) ([place])   . Pain    back pain chronic- seen at pain clinic  . Plantar fasciitis    bilatetral  . Thrombocytopenia ([place])   . Type II diabetes mellitus (HCC)     Medications:  Scheduled:  . amiodarone  200 mg Oral Daily  . amitriptyline  50 mg Oral QHS  . atorvastatin  10 mg Oral Daily  . insulin aspart  0-15 Units Subcutaneous TID WC  . insulin aspart  0-5 Units Subcutaneous QHS  . metoprolol tartrate  50 mg Oral BID  . pregabalin  300 mg Oral BID  . vitamin B-12  1,000 mcg Oral Daily   Infusions:  . ceFEPime (MAXIPIME) IV 2 g (08/07/20 1111)  . heparin 1,200 Units/hr (08/07/20 1648)  . vancomycin 750 mg (08/07/20 1236)    Assessment: 79 yo male to be admitted with toe infection started on IV abx's. Patient was on Xarelto PTA with last dose of 20mg  once daily reported given 10/9 at 1930.  To transition to IV heparin while patient is inpatient for time being. Baseline CBC and SCr already done.   08/07/2020 8:19 PM  APTT 72 therapeutic  HL 0.57 now in range plts WNL Hgb 11.6 No bleeding noted   Goal of Therapy:  Heparin level 0.3-0.7 units/ml Monitor platelets by anticoagulation protocol: Yes   Plan:   Continue IV heparin at 1200 units/hr  HL and aPTT seem to correlate at this point  Daily HL/CBC  Ulice Dash, PharmD, BCPS [phone] 08/07/2020, 8:19 PM

## 2020-08-07 NOTE — Progress Notes (Signed)
PROGRESS NOTE    SAHARSH STERLING  UKG:[id number] DOB: [birth year] DOA: 08/06/2020 PCP: [name], MD    Brief Narrative:  79 y.o. male with medical history significant of afib, CAD, combined systolic/diastolic CHF, HTN, DM2 with neuropathy who initially presented last month with R great toe injury sustained after falling and twisting his toe, resulting in dislocation which also required stitches. Stiches were removed one week later however wound reopened. Pt was given course of keflex. Wound continued to worsen and appeared to spread to adjacent second toe. A second course of keflex was prescribed. Patient returned to ED on 10/10 for wound check. Currently denies chest pain, sob, nausea. Denies florid drainage from foot, however states his toes feel "moist."  ED Course: In the Ed, pt was found to have a WBC of 4.3. Pt was afebrile. R foot xray demonstrated possible plantar avulsion fracture from the distal 1st phalanx possibly related to previous dislocation. No evidence of osteomyelitis seen on plain film xray. Pt was started on empiric vanc and cefepime. Hospitalist consulted for consideration for medical admission  Assessment & Plan:   Principal Problem:   Diabetic foot infection ([place]) Active Problems:   Hypertension   DM (diabetes mellitus), type 2, uncontrolled w/neurologic complication (HCC)   Paroxysmal atrial fibrillation with RVR (HCC)   Chronic combined systolic and diastolic heart failure (HCC)   Chronic anticoagulation   Osteomyelitis (HCC)   Acquired thrombophilia ([place])  1. Diabetic foot infection in R first toe with suspected osteomyelitis 1. Pt is s/p R toe injury one month ago with dislocation at R great toe IP joint noted, xray reviewed. Injury did require suturing 2. Prior to admit, pt completed multiple courses of keflex with continued worsening in wound 3. Will continue on empiric vancomycin and cefepime 4. Initial plain film without evidence of osteomyelitis. F/u  MRI foot was reviewed and noted marrow edema with observed changes related to osteo felt to be less likely 5. Given non-healing diabetic foot infection, have consulted Orthopedic Surgery 6. Repeat CBC and CMP in AM 2. DM2 with neuropathy 1. Prior a1c of 8.4 on 4/21 2. A1c this visit noted to be 7.8 3. Continue on SSI coverage as needed  4. Hold oral hyperglycemic meds while in hospital 3. Paroxysmal Afib 1. Currently rate controlled sinus 2. Continue amiodarone and beta blocker per home regimen 3. On xarelto prior to admit, continue on heparin gtt for now 4. [facility] as outpt 4. Chronic systolic and diastolic CHF 1. Most recent echo from 05/09/18 with EF 40-45% with grade 1 diastolic dysfunction  2. Appears euvolemic at this time 3. Would continue beta blocker, statin as tolerated 5. CAD 1. Chest pain free at this time 2. Continue beta blocker, statin per home regimen 6. HTN 1. BP stable and controlled 2. Continue metoprolol 7. Chronic anticoagulation 1. On xarelto prior to admit, last dose was evening of 10/9 2. While in hospital, will continue on heparin gtt incase surgery is warranted this admission   3. If surgery is confirmed to be not warranted, then would resume xarelto  DVT prophylaxis: Heparin gtt Code Status: Full Family Communication: Pt in room, family not at bedside  Status is: Inpatient  Remains inpatient appropriate because:Ongoing diagnostic testing needed not appropriate for outpatient work up, Unsafe d/c plan, IV treatments appropriate due to intensity of illness or inability to take PO and Inpatient level of care appropriate due to severity of illness   Dispo: The patient is from: Home  Anticipated d/c is to: Home              Anticipated d/c date is: 2 days              Patient currently is not medically stable to d/c.       Consultants:   Orthopedic Surgery  Procedures:     Antimicrobials: Anti-infectives (From  admission, onward)   Start     Dose/Rate Route Frequency Ordered Stop   08/07/20 0000  vancomycin (VANCOREADY) IVPB 750 mg/150 mL        750 mg 150 mL/hr over 60 Minutes Intravenous Every 12 hours 08/06/20 1457     08/06/20 2000  ceFEPIme (MAXIPIME) 2 g in sodium chloride 0.9 % 100 mL IVPB        2 g 200 mL/hr over 30 Minutes Intravenous Every 12 hours 08/06/20 1815     08/06/20 1130  vancomycin (VANCOCIN) IVPB 1000 mg/200 mL premix        1,000 mg 200 mL/hr over 60 Minutes Intravenous Every 1 hr x 2 08/06/20 1113 08/06/20 1447   08/06/20 1115  piperacillin-tazobactam (ZOSYN) IVPB 3.375 g        3.375 g 100 mL/hr over 30 Minutes Intravenous  Once 08/06/20 1105 08/06/20 1215       Subjective: Without complaints this AM. Denies chest pain  Objective: Vitals:   08/06/20 1659 08/06/20 2119 08/07/20 0518 08/07/20 1340  BP: 128/74 119/62 125/72 121/66  Pulse: (!) 58 67 (!) 59 65  Resp: 18 18 18 18   Temp: 97.6 F (36.4 C) 97.8 F (36.6 C) 98.2 F (36.8 C) 98.2 F (36.8 C)  TempSrc: Oral Oral  Oral  SpO2: 99% 98% 99% 97%  Weight:      Height:        Intake/Output Summary (Last 24 hours) at 08/07/2020 1520 Last data filed at 08/07/2020 1000 Gross per 24 hour  Intake 1164.95 ml  Output 400 ml  Net 764.95 ml   Filed Weights   08/06/20 1001  Weight: 92 kg    Examination:  General exam: Appears calm and comfortable  Respiratory system: Clear to auscultation. Respiratory effort normal. Cardiovascular system: S1 & S2 heard, Regular. Gastrointestinal system: Abdomen is nondistended, soft and nontender. No organomegaly or masses felt. Normal bowel sounds heard. Central nervous system: Alert and oriented. No focal neurological deficits. Extremities: Symmetric 5 x 5 power. Skin: No rashes, lesions, R 1st and 2nd toes with patches of erythema and edema, no active drainage Psychiatry: Judgement and insight appear normal. Mood & affect appropriate.   Data Reviewed: I have  personally reviewed following labs and imaging studies  CBC: Recent Labs  Lab 08/06/20 1121 08/07/20 0320  WBC 4.3 4.4  NEUTROABS 2.6  --   HGB 11.3* 11.3*  HCT 34.8* 34.6*  MCV 95.6 95.8  PLT 207 846   Basic Metabolic Panel: Recent Labs  Lab 08/06/20 1121 08/07/20 0320  NA 138 140  K 4.4 4.1  CL 102 105  CO2 27 25  GLUCOSE 216* 178*  BUN 19 21  CREATININE 1.28* 1.17  CALCIUM 9.0 9.1   GFR: Estimated Creatinine Clearance: 54.3 mL/min (by C-G formula based on SCr of 1.17 mg/dL). Liver Function Tests: Recent Labs  Lab 08/06/20 1121 08/07/20 0320  AST 17 17  ALT 18 17  ALKPHOS 72 71  BILITOT 0.4 0.7  PROT 7.3 6.9  ALBUMIN 3.1* 3.2*   No results for input(s): LIPASE, AMYLASE in the last 168  hours. No results for input(s): AMMONIA in the last 168 hours. Coagulation Profile: No results for input(s): INR, PROTIME in the last 168 hours. Cardiac Enzymes: No results for input(s): CKTOTAL, CKMB, CKMBINDEX, TROPONINI in the last 168 hours. BNP (last 3 results) No results for input(s): PROBNP in the last 8760 hours. HbA1C: Recent Labs    08/07/20 0320  HGBA1C 7.8*   CBG: Recent Labs  Lab 08/06/20 1826 08/06/20 2123 08/07/20 0743 08/07/20 1135  GLUCAP 175* 193* 116* 117*   Lipid Profile: No results for input(s): CHOL, HDL, LDLCALC, TRIG, CHOLHDL, LDLDIRECT in the last 72 hours. Thyroid Function Tests: No results for input(s): TSH, T4TOTAL, FREET4, T3FREE, THYROIDAB in the last 72 hours. Anemia Panel: No results for input(s): VITAMINB12, FOLATE, FERRITIN, TIBC, IRON, RETICCTPCT in the last 72 hours. Sepsis Labs: Recent Labs  Lab 08/06/20 1121  LATICACIDVEN 1.5    Recent Results (from the past 240 hour(s))  Blood culture (routine x 2)     Status: None (Preliminary result)   Collection Time: 08/06/20 11:21 AM   Specimen: Right Antecubital; Blood  Result Value Ref Range Status   Specimen Description   Final    RIGHT ANTECUBITAL Performed at [facility], [address] [place], [place] [address]    Special Requests   Final    Blood Culture adequate volume BOTTLES DRAWN AEROBIC AND ANAEROBIC Performed at [facility], [address] [place], [place] [address]    Culture   Final    NO GROWTH < 24 HOURS Performed at [facility], [place] [address], [place], [place] [address]    Report Status PENDING  Incomplete  Respiratory Panel by RT PCR (Flu A&B, Covid) - Nasopharyngeal Swab     Status: None   Collection Time: 08/06/20 11:21 AM   Specimen: Nasopharyngeal Swab  Result Value Ref Range Status   SARS Coronavirus 2 by RT PCR NEGATIVE NEGATIVE Final    Comment: (NOTE) SARS-CoV-2 target nucleic acids are NOT DETECTED.  The SARS-CoV-2 RNA is generally detectable in upper respiratoy specimens during the acute phase of infection. The lowest concentration of SARS-CoV-2 viral copies this assay can detect is 131 copies/mL. A negative result does not preclude SARS-Cov-2 infection and should not be used as the sole basis for treatment or other patient management decisions. A negative result may occur with  improper specimen collection/handling, submission of specimen other than nasopharyngeal swab, presence of viral mutation(s) within the areas targeted by this assay, and inadequate number of viral copies (<131 copies/mL). A negative result must be combined with clinical observations, patient history, and epidemiological information. The expected result is Negative.  Fact Sheet for Patients:  PinkCheek.be  Fact Sheet for Healthcare Providers:  GravelBags.it  This test is no t yet approved or cleared by the [place] FDA and  has been authorized for detection and/or diagnosis of SARS-CoV-2 by FDA under an Emergency Use Authorization (EUA). This EUA will remain  in effect (meaning this test can be used) for the duration of the COVID-19  declaration under Section 564(b)(1) of the Act, 21 U.S.C. section 360bbb-3(b)(1), unless the authorization is terminated or revoked sooner.     Influenza A by PCR NEGATIVE NEGATIVE Final   Influenza B by PCR NEGATIVE NEGATIVE Final    Comment: (NOTE) The Xpert Xpress SARS-CoV-2/FLU/RSV assay is intended as an aid in  the diagnosis of influenza from Nasopharyngeal swab specimens and  should not be used as a sole  basis for treatment. Nasal washings and  aspirates are unacceptable for Xpert Xpress SARS-CoV-2/FLU/RSV  testing.  Fact Sheet for Patients: PinkCheek.be  Fact Sheet for Healthcare Providers: GravelBags.it  This test is not yet approved or cleared by the [place] FDA and  has been authorized for detection and/or diagnosis of SARS-CoV-2 by  FDA under an Emergency Use Authorization (EUA). This EUA will remain  in effect (meaning this test can be used) for the duration of the  Covid-19 declaration under Section 564(b)(1) of the Act, 21  U.S.C. section 360bbb-3(b)(1), unless the authorization is  terminated or revoked. Performed at [facility], [address] [place], [place] [address]   Blood culture (routine x 2)     Status: None (Preliminary result)   Collection Time: 08/06/20 11:27 AM   Specimen: BLOOD RIGHT FOREARM  Result Value Ref Range Status   Specimen Description   Final    BLOOD RIGHT FOREARM Performed at [facility], [address] [place], [place] [address]    Special Requests   Final    Blood Culture adequate volume BOTTLES DRAWN AEROBIC AND ANAEROBIC Performed at [facility], [address] [place], [place] [address]    Culture   Final    NO GROWTH < 24 HOURS Performed at [facility], [place] [address], [place], [place] [address]    Report Status PENDING  Incomplete     Radiology Studies: MR FOOT RIGHT WO CONTRAST  Result Date:  08/07/2020 CLINICAL DATA:  Infection of the great toe and second toe. Clinical concern for osteomyelitis EXAM: MRI OF THE RIGHT FOREFOOT WITHOUT CONTRAST TECHNIQUE: Multiplanar, multisequence MR imaging of the right forefoot was performed. No intravenous contrast was administered. COMPARISON:  X-ray 08/06/2020 FINDINGS: Bones/Joint/Cartilage There is bone marrow edema of the periarticular aspects of the great toe proximal and distal phalanx adjacent to the interphalangeal joint (series 5, images 13-16) with subtle cortical disruption most suggestive of nondisplaced fractures in the setting of recent dislocation. Joint alignment is anatomic status post reduction. There is faint marrow edema within the middle phalanx of the right second toe (series 7, image 14) with preservation of the fatty T1 bone marrow signal. Fourth digit hammertoe deformity. The remaining visualized osseous structures are intact without fracture, dislocation, or evidence of osteomyelitis. Minimal degenerative marrow signal changes of the first-third MTP joints. Ligaments Intact Lisfranc ligament. Collateral ligaments of the forefoot appear intact. Muscles and Tendons Atrophy and fatty infiltration of the intrinsic foot musculature with mild diffuse edema-like signal. Soft tissues No focal soft tissue ulceration no significant soft tissue swelling or edema at the great toe or second toe. Mild dorsal subcutaneous edema. There is an ovoid T2 heterogeneously hyperintense, T1 heterogeneously hyperintense thick-walled structure within the plantar soft tissues of the forefoot underlying the mid first metatarsal diaphysis adjacent to the first flexor tendon (series 4, image 36; series 5, image 18) measuring 10 x 8 x 6 mm. Otherwise, no soft tissue fluid collection. IMPRESSION: 1. Bone marrow edema of the periarticular aspects of the great toe proximal and distal phalanx adjacent to the IP joint with subtle cortical irregularity most suggestive of  nondisplaced fractures in the setting of recent dislocation. Changes related to osteomyelitis are felt less likely secondary to the relative lack of inflammatory changes in the adjacent soft tissues. 2. Faint marrow edema within the middle phalanx of the right second toe with preservation of the fatty T1 bone marrow signal. Findings  are favored to be reactive. Early acute osteomyelitis would be difficult to entirely exclude. 3. Ovoid 10 mm cyst-like structure within the plantar soft tissues of the forefoot underlying the mid first metatarsal diaphysis. Differential considerations include a ganglion cyst, focal adventitial bursa, soft tissue hemangioma. Abscess felt to be less likely. 4. Atrophy and fatty infiltration of the intrinsic foot musculature with mild diffuse edema-like signal. Findings likely represent a combination of chronic denervation changes as well as a nonspecific myositis. Electronically Signed   By: Davina Poke D.O.   On: 08/07/2020 11:34   DG Foot Complete Right  Result Date: 08/06/2020 CLINICAL DATA:  Worsening toe and foot infection. Failing antibiotics. Diabetes. EXAM: RIGHT FOOT COMPLETE - 3+ VIEW COMPARISON:  Radiographs 08/04/2020 and 07/19/2020. FINDINGS: The bones are demineralized. No evidence of acute fracture or dislocation. There is irregularity of the plantar base of the distal 1st phalanx on the lateral view which may relate to the previously demonstrated dislocation. Stable degenerative changes at the metatarsophalangeal and interphalangeal joints of the great toe. No bone destruction, foreign body or soft tissue emphysema identified. There may be some forefoot soft tissue swelling. IMPRESSION: Possible plantar plate avulsion fracture from the distal 1st phalanx related to previously demonstrated dislocation. No evidence of osteomyelitis or acute osseous findings. Stable degenerative changes. Electronically Signed   By: Richardean Sale M.D.   On: 08/06/2020 12:40     Scheduled Meds: . amiodarone  200 mg Oral Daily  . amitriptyline  50 mg Oral QHS  . atorvastatin  10 mg Oral Daily  . insulin aspart  0-15 Units Subcutaneous TID WC  . insulin aspart  0-5 Units Subcutaneous QHS  . metoprolol tartrate  50 mg Oral BID  . pregabalin  300 mg Oral BID  . vitamin B-12  1,000 mcg Oral Daily   Continuous Infusions: . ceFEPime (MAXIPIME) IV 2 g (08/07/20 1111)  . heparin 1,200 Units/hr (08/06/20 2002)  . vancomycin 750 mg (08/07/20 1236)     LOS: 1 day   Marylu Lund, MD [facility] Pager On Amion  If 7PM-7AM, please contact night-coverage 08/07/2020, 3:20 PM

## 2020-08-08 DIAGNOSIS — L03119 Cellulitis of unspecified part of limb: Secondary | ICD-10-CM | POA: Diagnosis not present

## 2020-08-08 DIAGNOSIS — L03031 Cellulitis of right toe: Secondary | ICD-10-CM

## 2020-08-08 DIAGNOSIS — L089 Local infection of the skin and subcutaneous tissue, unspecified: Secondary | ICD-10-CM | POA: Diagnosis not present

## 2020-08-08 DIAGNOSIS — E11628 Type 2 diabetes mellitus with other skin complications: Secondary | ICD-10-CM | POA: Diagnosis not present

## 2020-08-08 LAB — CBC
HCT: 35.2 % — ABNORMAL LOW (ref 39.0–52.0)
Hemoglobin: 11.7 g/dL — ABNORMAL LOW (ref 13.0–17.0)
MCH: 31.4 pg (ref 26.0–34.0)
MCHC: 33.2 g/dL (ref 30.0–36.0)
MCV: 94.4 fL (ref 80.0–100.0)
Platelets: 201 10*3/uL (ref 150–400)
RBC: 3.73 MIL/uL — ABNORMAL LOW (ref 4.22–5.81)
RDW: 14.5 % (ref 11.5–15.5)
WBC: 5 10*3/uL (ref 4.0–10.5)
nRBC: 0 % (ref 0.0–0.2)

## 2020-08-08 LAB — GLUCOSE, CAPILLARY
Glucose-Capillary: 146 mg/dL — ABNORMAL HIGH (ref 70–99)
Glucose-Capillary: 188 mg/dL — ABNORMAL HIGH (ref 70–99)

## 2020-08-08 LAB — HEPARIN LEVEL (UNFRACTIONATED): Heparin Unfractionated: 0.47 IU/mL (ref 0.30–0.70)

## 2020-08-08 MED ORDER — PENTOXIFYLLINE ER 400 MG PO TBCR: 400.0000 mg | EXTENDED_RELEASE_TABLET | Freq: Three times a day (TID) | ORAL | 3 refills | Status: DC

## 2020-08-08 MED ORDER — RIVAROXABAN 10 MG PO TABS
20.0000 mg | ORAL_TABLET | Freq: Every day | ORAL | Status: DC
  Administered 2020-08-08: 20 mg via ORAL
  Filled 2020-08-08: qty 2

## 2020-08-08 MED ORDER — NITROGLYCERIN 0.2 MG/HR TD PT24: 0.2000 mg | MEDICATED_PATCH | Freq: Every day | TRANSDERMAL | 12 refills | Status: DC

## 2020-08-08 MED ORDER — DOXYCYCLINE HYCLATE 100 MG PO TABS: 100.0000 mg | ORAL_TABLET | Freq: Two times a day (BID) | ORAL | 0 refills | Status: DC

## 2020-08-08 NOTE — Consult Note (Addendum)
ORTHOPAEDIC CONSULTATION  REQUESTING PHYSICIAN: Donne Hazel, MD  Chief Complaint: Dislocation right great toe with ulcers on the right great toe and second toe.  HPI: [name] is a 79 y.o. male who presents with diabetic insensate neuropathy.  Patient states that he fell sustaining an open dislocation of the right great toe.  Patient states that he initially underwent closed reduction and suturing of the plantar wound of the great toe at the urgent care at [facility] on [facility].  Patient states that the great toe was buddy taped to the second toe and he developed an ulceration on the second toe from the taping.  Past Medical History:  Diagnosis Date  . AAA (abdominal aortic aneurysm) ([place])    a. s/p repair 2009.  [name] Acid reflux    takes Prilosec and Protonix daily  . Arthritis    BacK  . CAD in native artery    a. a. prior inf MI 1993. b. PCI to LAD 05/1997. c. recurrent inferolateral MI complicated by V. fib arrest in 04/1998, prior PCI to OM1. d. 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  [name] Chronic combined systolic and diastolic CHF (congestive heart failure) ([place])   . Complication of anesthesia    -years ago hair fell out.;ileus after 2 of his surgeries  . Dry skin   . Foot drop, bilateral   . Hypercholesteremia   . Hypertension   . Ileus ([place])    After AAA  . Incisional hernia    "small, from AAA"  . Ischemic cardiomyopathy   . MI (myocardial infarction) ([place])   . 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 OSA on CPAP    uses CPAP--sleep study done at least 50yrs ago  . PAF (paroxysmal atrial fibrillation) ([place])   . Pain    back pain chronic- seen at pain clinic  . Plantar fasciitis    bilatetral  . Thrombocytopenia ([place])   . Type II diabetes mellitus ([place])    Past Surgical History:  Procedure Laterality Date  . ABDOMINAL AORTIC ANEURYSM REPAIR    . BACK SURGERY  2012-2013 X 3   Miminal Invasive x 3in [place].  [name] CARDIAC CATHETERIZATION  2009/2012  . CARDIOVERSION N/A  04/12/2015   Procedure: CARDIOVERSION;  Surgeon: Sueanne Margarita, MD;  Location: [place];  Service: Cardiovascular;  Laterality: N/A;  . CARDIOVERSION N/A 12/26/2016   Procedure: CARDIOVERSION;  Surgeon: Sueanne Margarita, MD;  [facility];  Service: Cardiovascular;  Laterality: N/A;  . COLONOSCOPY    . CORONARY ANGIOPLASTY WITH STENT PLACEMENT  1992; 1997  . LAPAROSCOPIC CHOLECYSTECTOMY    . LUMBAR LAMINECTOMY/DECOMPRESSION MICRODISCECTOMY Right 01/16/2016   Procedure: Right Lumbar five-Sacral one Laminectomy;  Surgeon: Kristeen Miss, MD;  Location: [place] NEURO ORS;  Service: Neurosurgery;  Laterality: Right;  Right L5-S1 Laminectomy   Social History   Socioeconomic History  . Marital status: Married    Spouse name: Not on file  . Number of children: Not on file  . Years of education: Not on file  . Highest education level: Not on file  Occupational History  . Not on file  Tobacco Use  . Smoking status: Former Smoker    Packs/day: 1.00    Years: 33.00    Pack years: 33.00    Types: Cigarettes    Quit date: 01/01/1992    Years since quitting: 28.6  . Smokeless tobacco: Never Used  Vaping Use  . Vaping Use: Never  used  Substance and Sexual Activity  . Alcohol use: Yes    Comment: 6/30;2017 "GLASS OF WINE Q COUPLE MONTHS, IF THAT"  . Drug use: No  . Sexual activity: Yes  Other Topics Concern  . Not on file  Social History Narrative  . Not on file   Social Determinants of Health   Financial Resource Strain:   . Difficulty of Paying Living Expenses: Not on file  Food Insecurity:   . Worried About Charity fundraiser in the Last Year: Not on file  . Ran Out of Food in the Last Year: Not on file  Transportation Needs:   . Lack of Transportation (Medical): Not on file  . Lack of Transportation (Non-Medical): Not on file  Physical Activity:   . Days of Exercise per Week: Not on file  . Minutes of Exercise per Session: Not on file  Stress:   . Feeling of Stress : Not  on file  Social Connections:   . Frequency of Communication with Friends and Family: Not on file  . Frequency of Social Gatherings with Friends and Family: Not on file  . Attends Religious Services: Not on file  . Active Member of Clubs or Organizations: Not on file  . Attends Archivist Meetings: Not on file  . Marital Status: Not on file   Family History  Problem Relation Age of Onset  . Diabetes Mother   . Coronary artery disease Mother   . Diabetes Father   . Arthritis Father   . Rheum arthritis Father   . Coronary artery disease Father    - negative except otherwise stated in the family history section Allergies  Allergen Reactions  . Cymbalta [Duloxetine Hcl] Nausea And Vomiting and Other (See Comments)    PATIENT STATED THAT HE FELT LIKE HE "WAS GOING TO DIE" Rapid drop in blood pressure; sent him to the ER X2  . Neurontin [Gabapentin] Other (See Comments)    "Makes me goofy, keeps me off balance, clouds my thinking.."  . Keppra [Levetiracetam] Nausea And Vomiting   Prior to Admission medications   Medication Sig Start Date End Date Taking? Authorizing Provider  amiodarone (PACERONE) 200 MG tablet Take 1 tablet (200 mg total) by mouth daily. 03/31/20  Yes Belva Crome, MD  amitriptyline (ELAVIL) 25 MG tablet Take 50 mg by mouth at bedtime.  12/11/18  Yes [provider]  ascorbic acid ([employer] VITAMIN C) 500 MG tablet Take 500 mg by mouth daily.   Yes [provider]  atorvastatin (LIPITOR) 10 MG tablet Take 10 mg by mouth daily.   Yes [provider]  cholecalciferol (VITAMIN D3) 25 MCG (1000 UNIT) tablet Take 1,000 Units by mouth daily.   Yes [provider]  Coenzyme Q10 (CO Q 10) 100 MG CAPS Take 100 mg by mouth daily.   Yes [provider]  diazepam (VALIUM) 5 MG tablet Take 5 mg by mouth at bedtime as needed for anxiety.  10/31/15  Yes [provider]  empagliflozin (JARDIANCE) 10 MG TABS tablet Take 10 mg by  mouth daily with breakfast.    Yes [provider]  furosemide (LASIX) 40 MG tablet Take 40 mg by mouth daily as needed for fluid.  01/11/20  Yes [provider]  glimepiride (AMARYL) 2 MG tablet Take 2 mg by mouth every morning. 08/03/20  Yes [provider]  metFORMIN (GLUCOPHAGE-XR) 500 MG 24 hr tablet Take 500-1,000 mg by mouth See admin instructions.  1,000 mg in the morning and 500 mg in the evening 10/12/15  Yes [provider]  metoprolol tartrate (LOPRESSOR) 50 MG tablet Take 1 tablet by mouth twice daily 06/30/20  Yes Belva Crome, MD  Multiple Vitamins-Minerals (ONE-A-DAY MENS 50+ ADVANTAGE) TABS Take 1 tablet by mouth daily.   Yes [provider]  Olopatadine HCl (PATADAY OP) Place 1 drop into both eyes daily.   Yes [provider]  oxyCODONE-acetaminophen (PERCOCET) 7.5-325 MG tablet Take 1 tablet by mouth every 6 (six) hours as needed for moderate pain or severe pain.  07/20/20  Yes [provider]  pregabalin (LYRICA) 300 MG capsule Take 1 capsule by mouth 2 (two) times a day. 02/16/19  Yes [provider]  rivaroxaban (XARELTO) 20 MG TABS tablet Take 20 mg by mouth every evening.    Yes [provider]  trolamine salicylate (ASPERCREME) 10 % cream Apply 1 application topically as needed for muscle pain.   Yes [provider]  vitamin B-12 (CYANOCOBALAMIN) 1000 MCG tablet Take 1,000 mcg by mouth daily.    Yes [provider]  cephALEXin (KEFLEX) 500 MG capsule Take 1 capsule (500 mg total) by mouth 3 (three) times daily for 7 days. 08/04/20 08/11/20  Alroy Bailiff, [place], PA-C  pregabalin (LYRICA) 200 MG capsule Take 200 mg by mouth at bedtime. 08/04/20   [provider]   MR FOOT RIGHT WO CONTRAST  Result Date: 08/07/2020 CLINICAL DATA:  Infection of the great toe and second toe. Clinical concern for osteomyelitis EXAM: MRI OF THE RIGHT FOREFOOT WITHOUT CONTRAST TECHNIQUE: Multiplanar,  multisequence MR imaging of the right forefoot was performed. No intravenous contrast was administered. COMPARISON:  X-ray 08/06/2020 FINDINGS: Bones/Joint/Cartilage There is bone marrow edema of the periarticular aspects of the great toe proximal and distal phalanx adjacent to the interphalangeal joint (series 5, images 13-16) with subtle cortical disruption most suggestive of nondisplaced fractures in the setting of recent dislocation. Joint alignment is anatomic status post reduction. There is faint marrow edema within the middle phalanx of the right second toe (series 7, image 14) with preservation of the fatty T1 bone marrow signal. Fourth digit hammertoe deformity. The remaining visualized osseous structures are intact without fracture, dislocation, or evidence of osteomyelitis. Minimal degenerative marrow signal changes of the first-third MTP joints. Ligaments Intact Lisfranc ligament. Collateral ligaments of the forefoot appear intact. Muscles and Tendons Atrophy and fatty infiltration of the intrinsic foot musculature with mild diffuse edema-like signal. Soft tissues No focal soft tissue ulceration no significant soft tissue swelling or edema at the great toe or second toe. Mild dorsal subcutaneous edema. There is an ovoid T2 heterogeneously hyperintense, T1 heterogeneously hyperintense thick-walled structure within the plantar soft tissues of the forefoot underlying the mid first metatarsal diaphysis adjacent to the first flexor tendon (series 4, image 36; series 5, image 18) measuring 10 x 8 x 6 mm. Otherwise, no soft tissue fluid collection. IMPRESSION: 1. Bone marrow edema of the periarticular aspects of the great toe proximal and distal phalanx adjacent to the IP joint with subtle cortical irregularity most suggestive of nondisplaced fractures in the setting of recent dislocation. Changes related to osteomyelitis are felt less likely secondary to the relative lack of inflammatory changes in the  adjacent soft tissues. 2. Faint marrow edema within the middle phalanx of the right second toe with preservation of the fatty T1 bone marrow signal. Findings are favored to be reactive. Early acute osteomyelitis would be difficult to entirely exclude. 3.  Ovoid 10 mm cyst-like structure within the plantar soft tissues of the forefoot underlying the mid first metatarsal diaphysis. Differential considerations include a ganglion cyst, focal adventitial bursa, soft tissue hemangioma. Abscess felt to be less likely. 4. Atrophy and fatty infiltration of the intrinsic foot musculature with mild diffuse edema-like signal. Findings likely represent a combination of chronic denervation changes as well as a nonspecific myositis. Electronically Signed   By: Davina Poke D.O.   On: 08/07/2020 11:34   DG Foot Complete Right  Result Date: 08/06/2020 CLINICAL DATA:  Worsening toe and foot infection. Failing antibiotics. Diabetes. EXAM: RIGHT FOOT COMPLETE - 3+ VIEW COMPARISON:  Radiographs 08/04/2020 and 07/19/2020. FINDINGS: The bones are demineralized. No evidence of acute fracture or dislocation. There is irregularity of the plantar base of the distal 1st phalanx on the lateral view which may relate to the previously demonstrated dislocation. Stable degenerative changes at the metatarsophalangeal and interphalangeal joints of the great toe. No bone destruction, foreign body or soft tissue emphysema identified. There may be some forefoot soft tissue swelling. IMPRESSION: Possible plantar plate avulsion fracture from the distal 1st phalanx related to previously demonstrated dislocation. No evidence of osteomyelitis or acute osseous findings. Stable degenerative changes. Electronically Signed   By: Richardean Sale M.D.   On: 08/06/2020 12:40   - pertinent xrays, CT, MRI studies were reviewed and independently interpreted  Positive ROS: All other systems have been reviewed and were otherwise negative with the exception  of those mentioned in the HPI and as above.  Physical Exam: General: Alert, no acute distress Psychiatric: Patient is competent for consent with normal mood and affect Lymphatic: No axillary or cervical lymphadenopathy Cardiovascular: No pedal edema Respiratory: No cyanosis, no use of accessory musculature GI: No organomegaly, abdomen is soft and non-tender    Images:  @ENCIMAGES @  Labs:  Lab Results  Component Value Date   HGBA1C 7.8 (H) 08/07/2020   HGBA1C 8.4 (H) 02/09/2020   HGBA1C 7.3 (H) 07/04/2019   REPTSTATUS PENDING 08/06/2020   CULT  08/06/2020    NO GROWTH 2 DAYS Performed at [facility], [place] [address], [place], [place] [address]     Lab Results  Component Value Date   ALBUMIN 3.2 (L) 08/07/2020   ALBUMIN 3.1 (L) 08/06/2020   ALBUMIN 2.4 (L) 02/11/2020   PREALBUMIN 8.4 (L) 04/12/2008    Neurologic: Patient does not have protective sensation bilateral lower extremities.   MUSCULOSKELETAL:   Skin: Examination patient has a ischemic ulcer over the lateral aspect of the second toe which is dry there is no exposed bone or tendon there is no cellulitis.  The plantar wound on the right great toe beneath the IP joint is gaped open about a millimeter but there is no cellulitis no exposed bone or tendon.  There are multiple ulcers around the great toe and second toe which are stable there is no sausage digit swelling no signs of bone infection by x-ray.  Patient has a palpable dorsalis pedis and posterior tibial pulse ankle-brachial indices obtained in April of this year shows normal flow with triphasic and biphasic Doppler signals at the ankle.  Patient complains of weekly burning neuropathic pain in his feet has used lidocaine in the past.  MRI scan is reviewed and shows changes consistent with his dislocation.  Does show some ischemic changes to the skin to the second toe.  No definite osteomyelitis no abscess.  Assessment: Assessment: Reduced open  great toe dislocation with slow healing wound  on the plantar aspect of the great toe and a resolving ulcer on the second toe secondary to pressure from a dressing applied to treat the open great toe dislocation.  Plan: I feel patient's foot is stable and I can follow this as an outpatient.  Patient states he does have a postoperative shoe recommended washing with soap and water and wearing a regular sock.  Patient does not need any special wound ointments.  Have recommended and written orders for a nitroglycerin patch and Trental to help with the microcirculation in his foot.  Also orders written for doxycycline for discharge.  I will follow-up in a week.  Will address his neuropathic pain with Neurontin.  Thank you for the consult and the opportunity to see Mr. Ritchie Klee, [place] [phone] 9:48 AM

## 2020-08-08 NOTE — Progress Notes (Signed)
Heparin drip maintained at 1200 u/hr / 12 mL/hr.

## 2020-08-08 NOTE — Progress Notes (Addendum)
ANTICOAGULATION CONSULT NOTE -   Pharmacy Consult for IV heparin (on Xarelto PTA) Indication: atrial fibrillation  Allergies  Allergen Reactions  . Cymbalta [Duloxetine Hcl] Nausea And Vomiting and Other (See Comments)    PATIENT STATED THAT HE FELT LIKE HE "WAS GOING TO DIE" Rapid drop in blood pressure; sent him to the ER X2  . Neurontin [Gabapentin] Other (See Comments)    "Makes me goofy, keeps me off balance, clouds my thinking.."  . Keppra [Levetiracetam] Nausea And Vomiting    Patient Measurements: Height: 6' (182.9 cm) Weight: 92 kg (202 lb 13.2 oz) IBW/kg (Calculated) : 77.6 Heparin Dosing Weight: 92 kg  Vital Signs: Temp: 97.4 F (36.3 C) (10/12 0649) Temp Source: Oral (10/12 0649) BP: 122/71 (10/12 0649) Pulse Rate: 63 (10/12 0649)  Labs: Recent Labs    08/06/20 1121 08/06/20 1121 08/07/20 0320 08/07/20 0539 08/07/20 1911 08/08/20 0308  HGB 11.3*   < > 11.3*  --   --  11.7*  HCT 34.8*  --  34.6*  --   --  35.2*  PLT 207  --  193  --   --  201  APTT  --   --   --  73* 72*  --   HEPARINUNFRC  --   --  1.22*  --  0.57 0.47  CREATININE 1.28*  --  1.17  --   --   --    < > = values in this interval not displayed.    Estimated Creatinine Clearance: 54.3 mL/min (by C-G formula based on SCr of 1.17 mg/dL).   Assessment: 79 yo male to be admitted with toe infection started on IV abx's. Patient was on Xarelto PTA with last dose of 20mg  once daily reported given 10/9 at 1930. To transition to IV heparin while patient is inpatient for time being. Baseline CBC and SCr already done.   08/08/2020 8:21 AM  Heparin level 0.47 - therapeutic plts WNL Hgb 11.7 - stable No bleeding reported  Goal of Therapy:  Heparin level 0.3-0.7 units/ml Monitor platelets by anticoagulation protocol: Yes   Plan:   Continue IV heparin at 1200 units/hr  Daily Heparin level & CBC  F/u ortho consult. If no Sx indicated xarelto will be resumed at 20 mg q supper  Eudelia Bunch, Pharm.D 08/08/2020 8:24 AM   Addendum:  to transition back to home xarelto  Plan: Dc heparin drip at 1700 & start xarelto 20 qsupper as PTA Pharmacy to sign off  Eudelia Bunch, Pharm.D 08/08/2020 1:15 PM

## 2020-08-08 NOTE — Evaluation (Signed)
Physical Therapy Evaluation Patient Details Name: [name] MRN: [id number] DOB: January 12, 1941 Today's Date: 08/08/2020   History of Present Illness  79 y.o. male with medical history significant of afib, CAD, combined systolic/diastolic CHF, HTN, DM2 with neuropathy who initially presented last month with R great toe injury sustained after falling and twisting his toe, resulting in dislocation which also required stitches.  Per ortho note: " 79 y.o. male who presents with diabetic insensate neuropathy.  Patient states that he fell sustaining an open dislocation of the right great toe.  Patient states that he initially underwent closed reduction and suturing of the plantar wound of the great toe at the urgent care at [facility] on [facility].  Patient states that the great toe was buddy taped to the second toe and he developed an ulceration on the second toe from the taping."  Clinical Impression  Pt admitted with above diagnosis. Pt currently with functional limitations due to the deficits listed below (see PT Problem List). Pt will benefit from skilled PT to increase their independence and safety with mobility to allow discharge to the venue listed below.  Pt reports he falls at least once a day mostly due to LEs buckling however also has peripheral neuropathy.  Pt uses rollator at home and spouse reports she is mostly at home with pt due to falls.  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  Recommend HHPT to assist pt with balance and for home safety assessment.   Pt aware that if his balance does not improve, he may need to start using wheelchair for safety.     Follow Up Recommendations Home health PT;Supervision/Assistance - 24 hour    Equipment Recommendations  None recommended by PT    Recommendations for Other Services       Precautions / Restrictions Precautions Precautions: Fall Required Braces or Orthoses: Other Brace Other Brace:  has post op shoe at home      Mobility  Bed Mobility Overal bed mobility: Needs Assistance Bed Mobility: Supine to Sit     Supine to sit: Supervision        Transfers Overall transfer level: Needs assistance Equipment used: Rolling walker (2 wheeled) Transfers: Sit to/from Stand Sit to Stand: Min guard         General transfer comment: mildly unsteady with rise however no physical assist provided; reports difficulty with sit to stands at baseline  Ambulation/Gait Ambulation/Gait assistance: Min guard Gait Distance (Feet): 200 Feet Assistive device: Rolling walker (2 wheeled) Gait Pattern/deviations: Step-through pattern;Decreased dorsiflexion - right;Decreased dorsiflexion - left;Trunk flexed     General Gait Details: slow pace, observed right knee hyperextension in stance, pt with decreased bil DF and feet eversion with stance phase  Stairs            Wheelchair Mobility    Modified Rankin (Stroke Patients Only)       Balance Overall balance assessment: Needs assistance;History of Falls         Standing balance support: Bilateral upper extremity supported Standing balance-Leahy Scale: Poor Standing balance comment: completely reliant on UE support                             Pertinent Vitals/Pain Pain Assessment: No/denies pain    Home Living Family/patient expects to be discharged to:: Private residence Living Arrangements: Spouse/significant other Available Help at Discharge: Family;Available 24 hours/day Type  of Home: House Home Access: Stairs to enter Entrance Stairs-Rails: Right;Left;Can reach both Entrance Stairs-Number of Steps: 2-3 Home Layout: One level Home Equipment: Walker - 4 wheels;Cane - single point;Shower seat;Grab bars - toilet;Grab bars - tub/shower      Prior Function Level of Independence: Independent with assistive device(s)         Comments: uses rollators for mobility, independent with ADLs , still  drives some. Has had some falls     Hand Dominance        Extremity/Trunk Assessment        Lower Extremity Assessment Lower Extremity Assessment: Generalized weakness;RLE deficits/detail;LLE deficits/detail RLE Sensation: history of peripheral neuropathy LLE Sensation: history of peripheral neuropathy       Communication   Communication: No difficulties  Cognition Arousal/Alertness: Awake/alert Behavior During Therapy: WFL for tasks assessed/performed Overall Cognitive Status: Within Functional Limits for tasks assessed                                        General Comments General comments (skin integrity, edema, etc.): Pt reports falls at least ONCE a day.    Exercises     Assessment/Plan    PT Assessment Patient needs continued PT services  PT Problem List Decreased strength;Decreased mobility;Decreased balance;Decreased knowledge of use of DME;Impaired sensation       PT Treatment Interventions DME instruction;Therapeutic exercise;Gait training;Balance training;Functional mobility training;Wheelchair mobility training;Therapeutic activities;Patient/family education;Stair training;Neuromuscular re-education    PT Goals (Current goals can be found in the Care Plan section)  Acute Rehab PT Goals PT Goal Formulation: With patient Time For Goal Achievement: 08/22/20 Potential to Achieve Goals: Good    Frequency Min 3X/week   Barriers to discharge        Co-evaluation               AM-PAC PT "6 Clicks" Mobility  Outcome Measure Help needed turning from your back to your side while in a flat bed without using bedrails?: A Little Help needed moving from lying on your back to sitting on the side of a flat bed without using bedrails?: A Little Help needed moving to and from a bed to a chair (including a wheelchair)?: A Little Help needed standing up from a chair using your arms (e.g., wheelchair or bedside chair)?: A Little Help needed  to walk in hospital room?: A Little Help needed climbing 3-5 steps with a railing? : A Little 6 Click Score: 18    End of Session Equipment Utilized During Treatment: Gait belt Activity Tolerance: Patient tolerated treatment well Patient left: in chair;with call bell/phone within reach;with family/visitor present Nurse Communication: Mobility status PT Visit Diagnosis: Other abnormalities of gait and mobility (R26.89)    Time: 7408-1448 PT Time Calculation (min) (ACUTE ONLY): 31 min   Charges:   PT Evaluation $PT Eval Moderate Complexity: 1 Mod PT Treatments $Gait Training: 8-22 mins       Arlyce Dice, DPT Acute Rehabilitation Services Pager: [phone] Office: [phone]  York Ram E 08/08/2020, 3:32 PM

## 2020-08-08 NOTE — TOC Initial Note (Addendum)
Transition of Care [facility]) - Initial/Assessment Note    Patient Details  Name: [name] MRN: [id number] Date of Birth: Sep 13, 1941  Transition of Care [facility]) CM/SW Contact:    Lia Hopping, [place] Phone Number: 08/08/2020, 4:08 PM  Clinical Narrative:                 CSW met with patient and his spouse at bedside to discuss home health PT needs. CSW provided the patient and his spouse a medicare.gov list of agencies. Patient agreeable to an agency in network with his insurance. CSW reached out to [facility] and confirmed staff available for PT.  Patient has DME.   Expected Discharge Plan: [place] Barriers to Discharge: Barriers Resolved   Patient Goals and CMS Choice   CMS Medicare.gov Compare Post Acute Care list provided to:: Patient Choice offered to / list presented to : Patient  Expected Discharge Plan and Services Expected Discharge Plan: [place] In-house Referral: Clinical Social Work   Post Acute Care Choice: [place] arrangements for the past 2 months: [place] Expected Discharge Date: 08/08/20                         HH Arranged: PT HH Agency: [facility] Date [place]: 08/08/20 Time HH Agency Contacted: 37 Representative spoke with at [place]: [place] Arrangements/Services Living arrangements for the past 2 months: [place] with:: Spouse Patient language and need for interpreter reviewed:: No Do you feel safe going back to the place where you live?: Yes      Need for Family Participation in Patient Care: Yes (Comment) Care giver support system in place?: Yes (comment)   Criminal Activity/Legal Involvement Pertinent to Current Situation/Hospitalization: No - Comment as needed  Activities of Daily Living Home Assistive Devices/Equipment: Walker (specify type) ADL Screening (condition at time of admission) Patient's cognitive  ability adequate to safely complete daily activities?: Yes Is the patient deaf or have difficulty hearing?: No Does the patient have difficulty seeing, even when wearing glasses/contacts?: No Does the patient have difficulty concentrating, remembering, or making decisions?: No Patient able to express need for assistance with ADLs?: Yes Does the patient have difficulty dressing or bathing?: No Independently performs ADLs?: Yes (appropriate for developmental age) Does the patient have difficulty walking or climbing stairs?: No Weakness of Legs: None Weakness of Arms/Hands: None  Permission Sought/Granted Permission sought to share information with : Family Supports Permission granted to share information with : Yes, Verbal Permission Granted  Share Information with NAME: Maris,Patricia  Permission granted to share info w AGENCY: Home Health in the area  Permission granted to share info w Relationship: Spouse  Permission granted to share info w Contact Information: [phone]  Emotional Assessment Appearance:: Appears stated age Attitude/Demeanor/Rapport: Engaged Affect (typically observed): Accepting Orientation: : Oriented to Self, Oriented to Place, Oriented to  Time, Oriented to Situation Alcohol / Substance Use: Not Applicable Psych Involvement: No (comment)  Admission diagnosis:  Cellulitis in diabetic foot ([place]) [S34.196, L03.119] Cellulitis of toe of right foot [L03.031] Diabetic foot infection ([place]) [Q22.979, L08.9] Osteomyelitis ([place]) [M86.9] Patient Active Problem List   Diagnosis Date Noted  . Cellulitis of toe of right foot   . Diabetic foot infection ([place]) 08/06/2020  . Osteomyelitis ([place]) 08/06/2020  . Acquired thrombophilia ([place]) 08/06/2020  . Ileus ([place]) 02/13/2020  . Acute colitis 02/08/2020  .  Hypokalemia 07/08/2019  . Acute metabolic encephalopathy   . Orthostatic dizziness   . Postural dizziness with near syncope 07/04/2019  . Abdominal distention,  non-gaseous 07/04/2019  . Change in mental status 07/04/2019  . Small bowel obstruction ([place]) 07/04/2019  . Postural dizziness with presyncope 05/09/2018  . Hypotension 05/08/2018  . On amiodarone therapy 08/26/2017  . Near syncope 06/11/2017  . Hypoxia   . Pneumonia of left lower lobe due to infectious organism   . Gastroenteritis and colitis, viral 04/27/2016  . Orthostasis 04/26/2016  . AKI (acute kidney injury) ([place]) 04/26/2016  . Lumbar radiculopathy, chronic 01/16/2016  . Hematoma of left kidney 11/01/2015  . Hyperlipidemia 12/12/2014  . SIRS (systemic inflammatory response syndrome) ([place]) 12/12/2014  . Influenza due to identified novel influenza A virus with other respiratory manifestations 11/07/2013  . Chronic anticoagulation 09/08/2013    Class: Chronic  . Coronary artery disease involving native coronary artery of native heart with angina pectoris ([place]) 08/16/2013    Class: Chronic  . Chronic combined systolic and diastolic heart failure (HCC) 08/16/2013    Class: Chronic  . Paroxysmal atrial fibrillation with RVR ([place]) 08/13/2013  . Paralytic ileus ([place]) 05/07/2012  . Nausea and vomiting 01/01/2012  . DM (diabetes mellitus), type 2, uncontrolled w/neurologic complication ([place]) 71/03/2693  . Hypertension   . AAA (abdominal aortic aneurysm) ([place])   . Acid reflux   . Sleep apnea    PCP:  [name], MD Pharmacy:   [facility] [address] [place] [address] Phone: [phone] Fax: [phone]     Social Determinants of Health (SDOH) Interventions    Readmission Risk Interventions No flowsheet data found.

## 2020-08-08 NOTE — Discharge Summary (Signed)
Physician Discharge Summary  [name] TGY:[id number] DOB: 1941/09/15 DOA: 08/06/2020  PCP: [name], MD  Admit date: 08/06/2020 Discharge date: 08/08/2020  Admitted From: Home Disposition:  Home  Recommendations for Outpatient Follow-up:  1. Follow up with PCP in 1-2 weeks 2. Follow up with Dr. Sharol Given as scheduled  Discharge Condition:Stable CODE STATUS:Full Diet recommendation: Heart healthy, diabetic   Brief/Interim Summary: 79 y.o.malewith medical history significant ofafib, CAD, combined systolic/diastolic CHF, HTN, DM2 with neuropathy who initially presented last month with R great toe injury sustained after falling and twisting his toe, resulting in dislocation which also required stitches. Stiches were removed one week later however wound reopened. Pt was given course of keflex. Wound continued to worsen and appeared to spread to adjacent second toe. A second course of keflex was prescribed. Patient returned to ED on 10/10 for wound check. Currently denies chest pain, sob, nausea. Denies florid drainage from foot, however states his toes feel "moist."  ED Course:In the Ed, pt was found to have a WBC of 4.3. Pt was afebrile. R foot xray demonstrated possible plantar avulsion fracture from the distal 1st phalanx possibly related to previous dislocation. No evidence of osteomyelitis seen on plain film xray. Pt was started on empiric vanc and cefepime. Hospitalist consulted for consideration for medical admission  Discharge Diagnoses:  Principal Problem:   Diabetic foot infection ([place]) Active Problems:   Hypertension   DM (diabetes mellitus), type 2, uncontrolled w/neurologic complication (HCC)   Paroxysmal atrial fibrillation with RVR (HCC)   Chronic combined systolic and diastolic heart failure (HCC)   Chronic anticoagulation   Osteomyelitis (HCC)   Acquired thrombophilia (HCC)   Cellulitis of toe of right foot  1. Diabetic foot infection in R first toe with  suspected osteomyelitis 1. Pt is s/p R toe injury one month ago with dislocation at R great toe IP joint noted, xray reviewed. Injury did require suturing 2. Prior to admit, pt completed multiple courses of keflex with continued worsening in wound 3. Pt was continued on empiric vancomycin and cefepime 4. Initial plain film without evidence of osteomyelitis. F/u MRI foot was reviewed and noted marrow edema with observed changes related to osteo felt to be less likely 5. Given non-healing diabetic foot infection, consulted Orthopedic Surgery with recommendations for transition to PO doxycycline and for close outpatient follow up with Orthopedic Surgery 6. Pt has remained stable this visit 2. DM2 with neuropathy 1. Prior a1c of 8.4 on 4/21 2. A1c this visit noted to be 7.8 3. Continue on SSI coverage as needed while in hospital 4. Resume home meds on discharge 3. Paroxysmal Afib 1. Currently rate controlled sinus 2. Continue amiodarone and beta blocker per home regimen 3. Was on xarelto prior to admit and continued on heparin gtt while awaiting Orthopedic Surgery eval 4. No surgery warranted, thus pt is to resume home xarelto 4. Chronic systolic and diastolic CHF 1. Most recent echo from 05/09/18 with EF 40-45% with grade 1 diastolic dysfunction  2. Appears euvolemic at this time 3. Would continue beta blocker, statin as tolerated 5. CAD 1. Chest pain free at this time 2. Continue beta blocker, statin per home regimen 6. HTN 1. BP stable and controlled 2. Continuemetoprolol 7. Chronic anticoagulation 1. On xarelto prior to admit 2. While in hospital, pt was continued on heparin gtt incase surgery was warranted 3. Discussed with Orthopedic Surgery. No surgery planned, thus pt was continued on xarelto on d/c  Discharge Instructions   Allergies as  of 08/08/2020      Reactions   Cymbalta [duloxetine Hcl] Nausea And Vomiting, Other (See Comments)   PATIENT STATED THAT HE FELT LIKE HE  "WAS GOING TO DIE" Rapid drop in blood pressure; sent him to the ER X2   Neurontin [gabapentin] Other (See Comments)   "Makes me goofy, keeps me off balance, clouds my thinking.."   Keppra [levetiracetam] Nausea And Vomiting      Medication List    STOP taking these medications   cephALEXin 500 MG capsule Commonly known as: KEFLEX     TAKE these medications   amiodarone 200 MG tablet Commonly known as: Pacerone Take 1 tablet (200 mg total) by mouth daily.   amitriptyline 25 MG tablet Commonly known as: ELAVIL Take 50 mg by mouth at bedtime.   atorvastatin 10 MG tablet Commonly known as: LIPITOR Take 10 mg by mouth daily.   cholecalciferol 25 MCG (1000 UNIT) tablet Commonly known as: VITAMIN D3 Take 1,000 Units by mouth daily.   Co Q 10 100 MG Caps Take 100 mg by mouth daily.   [employer] Vitamin C 500 MG tablet Generic drug: ascorbic acid Take 500 mg by mouth daily.   diazepam 5 MG tablet Commonly known as: VALIUM Take 5 mg by mouth at bedtime as needed for anxiety.   doxycycline 100 MG tablet Commonly known as: VIBRA-TABS Take 1 tablet (100 mg total) by mouth 2 (two) times daily.   furosemide 40 MG tablet Commonly known as: LASIX Take 40 mg by mouth daily as needed for fluid.   glimepiride 2 MG tablet Commonly known as: AMARYL Take 2 mg by mouth every morning.   Jardiance 10 MG Tabs tablet Generic drug: empagliflozin Take 10 mg by mouth daily with breakfast.   metFORMIN 500 MG 24 hr tablet Commonly known as: GLUCOPHAGE-XR Take 500-1,000 mg by mouth See admin instructions. 1,000 mg in the morning and 500 mg in the evening   metoprolol tartrate 50 MG tablet Commonly known as: LOPRESSOR Take 1 tablet by mouth twice daily   nitroGLYCERIN 0.2 mg/hr patch Commonly known as: NITRODUR - Dosed in mg/24 hr Place 1 patch (0.2 mg total) onto the skin daily.   One-A-Day Mens 50+ Advantage Tabs Take 1 tablet by mouth daily.   oxyCODONE-acetaminophen 7.5-325 MG  tablet Commonly known as: PERCOCET Take 1 tablet by mouth every 6 (six) hours as needed for moderate pain or severe pain.   PATADAY OP Place 1 drop into both eyes daily.   pentoxifylline 400 MG CR tablet Commonly known as: TRENTAL Take 1 tablet (400 mg total) by mouth 3 (three) times daily with meals.   pregabalin 300 MG capsule Commonly known as: LYRICA Take 1 capsule by mouth 2 (two) times a day.   pregabalin 200 MG capsule Commonly known as: LYRICA Take 200 mg by mouth at bedtime.   trolamine salicylate 10 % cream Commonly known as: ASPERCREME Apply 1 application topically as needed for muscle pain.   vitamin B-12 1000 MCG tablet Commonly known as: CYANOCOBALAMIN Take 1,000 mcg by mouth daily.   Xarelto 20 MG Tabs tablet Generic drug: rivaroxaban Take 20 mg by mouth every evening.       Follow-up Information    Newt Minion, MD Follow up in 1 week(s).   Specialty: Orthopedic Surgery Contact information: [place] [address] [phone]        [name], MD. Schedule an appointment as soon as possible for a visit in 2  week(s).   Specialty: Internal Medicine Contact information: [address] [place] [address] [phone]        Belva Crome, MD .   Specialty: Cardiology Contact information: [phone] [address] [address] [place], [facility] Follow up.   Specialty: Home Health Services Why: This agency will provide your physical therapy at home. The agency will contact you prior to the first visit.  Contact information: [place] [address] [phone]              Allergies  Allergen Reactions  . Cymbalta [Duloxetine Hcl] Nausea And Vomiting and Other (See Comments)    PATIENT STATED THAT HE FELT LIKE HE "WAS GOING TO DIE" Rapid drop in blood pressure; sent him to the ER X2  . Neurontin [Gabapentin] Other (See Comments)     "Makes me goofy, keeps me off balance, clouds my thinking.."  . Keppra [Levetiracetam] Nausea And Vomiting    Consultations:  Orthopedic Surgery  Procedures/Studies: MR FOOT RIGHT WO CONTRAST  Result Date: 08/07/2020 CLINICAL DATA:  Infection of the great toe and second toe. Clinical concern for osteomyelitis EXAM: MRI OF THE RIGHT FOREFOOT WITHOUT CONTRAST TECHNIQUE: Multiplanar, multisequence MR imaging of the right forefoot was performed. No intravenous contrast was administered. COMPARISON:  X-ray 08/06/2020 FINDINGS: Bones/Joint/Cartilage There is bone marrow edema of the periarticular aspects of the great toe proximal and distal phalanx adjacent to the interphalangeal joint (series 5, images 13-16) with subtle cortical disruption most suggestive of nondisplaced fractures in the setting of recent dislocation. Joint alignment is anatomic status post reduction. There is faint marrow edema within the middle phalanx of the right second toe (series 7, image 14) with preservation of the fatty T1 bone marrow signal. Fourth digit hammertoe deformity. The remaining visualized osseous structures are intact without fracture, dislocation, or evidence of osteomyelitis. Minimal degenerative marrow signal changes of the first-third MTP joints. Ligaments Intact Lisfranc ligament. Collateral ligaments of the forefoot appear intact. Muscles and Tendons Atrophy and fatty infiltration of the intrinsic foot musculature with mild diffuse edema-like signal. Soft tissues No focal soft tissue ulceration no significant soft tissue swelling or edema at the great toe or second toe. Mild dorsal subcutaneous edema. There is an ovoid T2 heterogeneously hyperintense, T1 heterogeneously hyperintense thick-walled structure within the plantar soft tissues of the forefoot underlying the mid first metatarsal diaphysis adjacent to the first flexor tendon (series 4, image 36; series 5, image 18) measuring 10 x 8 x 6 mm. Otherwise, no  soft tissue fluid collection. IMPRESSION: 1. Bone marrow edema of the periarticular aspects of the great toe proximal and distal phalanx adjacent to the IP joint with subtle cortical irregularity most suggestive of nondisplaced fractures in the setting of recent dislocation. Changes related to osteomyelitis are felt less likely secondary to the relative lack of inflammatory changes in the adjacent soft tissues. 2. Faint marrow edema within the middle phalanx of the right second toe with preservation of the fatty T1 bone marrow signal. Findings are favored to be reactive. Early acute osteomyelitis would be difficult to entirely exclude. 3. Ovoid 10 mm cyst-like structure within the plantar soft tissues of the forefoot underlying the mid first metatarsal diaphysis. Differential considerations include a ganglion cyst, focal adventitial bursa, soft tissue hemangioma. Abscess felt to be less likely. 4. Atrophy and fatty infiltration of the intrinsic foot musculature with mild diffuse edema-like signal. Findings likely  represent a combination of chronic denervation changes as well as a nonspecific myositis. Electronically Signed   By: Davina Poke D.O.   On: 08/07/2020 11:34   DG Foot Complete Right  Result Date: 08/06/2020 CLINICAL DATA:  Worsening toe and foot infection. Failing antibiotics. Diabetes. EXAM: RIGHT FOOT COMPLETE - 3+ VIEW COMPARISON:  Radiographs 08/04/2020 and 07/19/2020. FINDINGS: The bones are demineralized. No evidence of acute fracture or dislocation. There is irregularity of the plantar base of the distal 1st phalanx on the lateral view which may relate to the previously demonstrated dislocation. Stable degenerative changes at the metatarsophalangeal and interphalangeal joints of the great toe. No bone destruction, foreign body or soft tissue emphysema identified. There may be some forefoot soft tissue swelling. IMPRESSION: Possible plantar plate avulsion fracture from the distal 1st  phalanx related to previously demonstrated dislocation. No evidence of osteomyelitis or acute osseous findings. Stable degenerative changes. Electronically Signed   By: Richardean Sale M.D.   On: 08/06/2020 12:40   DG Foot Complete Right  Result Date: 07/19/2020 CLINICAL DATA:  Fall, injury EXAM: RIGHT FOOT COMPLETE - 3+ VIEW COMPARISON:  None. FINDINGS: There is dislocation noted at the right great toe interphalangeal joint. The distal phalanx is dislocated anteriorly relative to the proximal phalanx. Mild degenerative changes at the 1st MTP joint. No fracture. IMPRESSION: Dislocation at the right great toe IP joint. Electronically Signed   By: Rolm Baptise M.D.   On: 07/19/2020 11:44   DG Toe 2nd Right  Result Date: 08/04/2020 CLINICAL DATA:  Re-injury second toe.  Bleeding. EXAM: RIGHT SECOND TOE COMPARISON:  None. FINDINGS: Soft tissue swelling is present in the distal digit. Joints are located. No acute or healing fractures are present. Tissues are otherwise unremarkable. IMPRESSION: Soft tissue swelling of the distal digit without underlying fracture. Electronically Signed   By: San Morelle M.D.   On: 08/04/2020 22:06   DG Toe Great Right  Result Date: 07/19/2020 CLINICAL DATA:  Post reduction first IP joint dislocation EXAM: RIGHT FIRST TOE: 3 V COMPARISON:  None. FINDINGS: Frontal, oblique, and lateral views were obtained. There has been interval reduction IP joint dislocation of the first digit. There is currently no evident dislocation. There are foci of calcification along the volar aspect of the joint. No acute fracture is demonstrable. There is narrowing of the first IP and MTP joints. No erosion. IMPRESSION: Interval reduction of first IP joint dislocation. Several intra-articular region calcifications are noted. An acute fracture is not appreciable on current examination. There is narrowing of the first MTP and IP joints. Electronically Signed   By: Lowella Grip III M.D.    On: 07/19/2020 13:20    Subjective: Eager to go home  Discharge Exam: Vitals:   08/08/20 0649 08/08/20 1342  BP: 122/71 109/62  Pulse: 63 72  Resp: 16 16  Temp: (!) 97.4 F (36.3 C) (!) 97.5 F (36.4 C)  SpO2: 95% 96%   Vitals:   08/07/20 1806 08/07/20 2306 08/08/20 0649 08/08/20 1342  BP: 128/69 122/61 122/71 109/62  Pulse: 65 66 63 72  Resp: 16 17 16 16   Temp: 98.2 F (36.8 C) 97.7 F (36.5 C) (!) 97.4 F (36.3 C) (!) 97.5 F (36.4 C)  TempSrc: Oral Oral Oral Oral  SpO2: 98% 99% 95% 96%  Weight:      Height:        General: Pt is alert, awake, not in acute distress Cardiovascular: RRR, S1/S2 +, no rubs, no gallops Respiratory: CTA  bilaterally, no wheezing, no rhonchi Abdominal: Soft, NT, ND, bowel sounds + Extremities: no edema, no cyanosis   The results of significant diagnostics from this hospitalization (including imaging, microbiology, ancillary and laboratory) are listed below for reference.     Microbiology: Recent Results (from the past 240 hour(s))  Blood culture (routine x 2)     Status: None (Preliminary result)   Collection Time: 08/06/20 11:21 AM   Specimen: Right Antecubital; Blood  Result Value Ref Range Status   Specimen Description   Final    RIGHT ANTECUBITAL Performed at [facility], [address] [place], [place] [address]    Special Requests   Final    Blood Culture adequate volume BOTTLES DRAWN AEROBIC AND ANAEROBIC Performed at [facility] [address], [place], [place] [address]    Culture   Final    NO GROWTH 2 DAYS Performed at [facility], [place] [address] [place], [place] [address]    Report Status PENDING  Incomplete  Respiratory Panel by RT PCR (Flu A&B, Covid) - Nasopharyngeal Swab     Status: None   Collection Time: 08/06/20 11:21 AM   Specimen: Nasopharyngeal Swab  Result Value Ref Range Status   SARS Coronavirus 2 by RT PCR NEGATIVE NEGATIVE Final    Comment: (NOTE) SARS-CoV-2  target nucleic acids are NOT DETECTED.  The SARS-CoV-2 RNA is generally detectable in upper respiratoy specimens during the acute phase of infection. The lowest concentration of SARS-CoV-2 viral copies this assay can detect is 131 copies/mL. A negative result does not preclude SARS-Cov-2 infection and should not be used as the sole basis for treatment or other patient management decisions. A negative result may occur with  improper specimen collection/handling, submission of specimen other than nasopharyngeal swab, presence of viral mutation(s) within the areas targeted by this assay, and inadequate number of viral copies (<131 copies/mL). A negative result must be combined with clinical observations, patient history, and epidemiological information. The expected result is Negative.  Fact Sheet for Patients:  PinkCheek.be  Fact Sheet for Healthcare Providers:  GravelBags.it  This test is no t yet approved or cleared by the [place] FDA and  has been authorized for detection and/or diagnosis of SARS-CoV-2 by FDA under an Emergency Use Authorization (EUA). This EUA will remain  in effect (meaning this test can be used) for the duration of the COVID-19 declaration under Section 564(b)(1) of the Act, 21 U.S.C. section 360bbb-3(b)(1), unless the authorization is terminated or revoked sooner.     Influenza A by PCR NEGATIVE NEGATIVE Final   Influenza B by PCR NEGATIVE NEGATIVE Final    Comment: (NOTE) The Xpert Xpress SARS-CoV-2/FLU/RSV assay is intended as an aid in  the diagnosis of influenza from Nasopharyngeal swab specimens and  should not be used as a sole basis for treatment. Nasal washings and  aspirates are unacceptable for Xpert Xpress SARS-CoV-2/FLU/RSV  testing.  Fact Sheet for Patients: PinkCheek.be  Fact Sheet for Healthcare  Providers: GravelBags.it  This test is not yet approved or cleared by the [place] FDA and  has been authorized for detection and/or diagnosis of SARS-CoV-2 by  FDA under an Emergency Use Authorization (EUA). This EUA will remain  in effect (meaning this test can be used) for the duration of the  Covid-19 declaration under Section 564(b)(1) of the Act, 21  U.S.C. section 360bbb-3(b)(1), unless the authorization is  terminated or revoked. Performed at [facility], [address] [place], [place] [address]   Blood culture (routine x 2)     Status: None (Preliminary result)   Collection Time: 08/06/20 11:27 AM   Specimen: BLOOD RIGHT FOREARM  Result Value Ref Range Status   Specimen Description   Final    BLOOD RIGHT FOREARM Performed at [facility], [address] [place], [place] [address]    Special Requests   Final    Blood Culture adequate volume BOTTLES DRAWN AEROBIC AND ANAEROBIC Performed at [facility], [address] [place], [place] [address]    Culture   Final    NO GROWTH 2 DAYS Performed at [facility], [place] [address], [place], [place] [address]    Report Status PENDING  Incomplete     Labs: BNP (last 3 results) Recent Labs    02/13/20 0406  BNP 580.9*   Basic Metabolic Panel: Recent Labs  Lab 08/06/20 1121 08/07/20 0320  NA 138 140  K 4.4 4.1  CL 102 105  CO2 27 25  GLUCOSE 216* 178*  BUN 19 21  CREATININE 1.28* 1.17  CALCIUM 9.0 9.1   Liver Function Tests: Recent Labs  Lab 08/06/20 1121 08/07/20 0320  AST 17 17  ALT 18 17  ALKPHOS 72 71  BILITOT 0.4 0.7  PROT 7.3 6.9  ALBUMIN 3.1* 3.2*   No results for input(s): LIPASE, AMYLASE in the last 168 hours. No results for input(s): AMMONIA in the last 168 hours. CBC: Recent Labs  Lab 08/06/20 1121 08/07/20 0320 08/08/20 0308  WBC 4.3 4.4 5.0  NEUTROABS 2.6  --   --   HGB 11.3* 11.3* 11.7*  HCT 34.8* 34.6*  35.2*  MCV 95.6 95.8 94.4  PLT 207 193 201   Cardiac Enzymes: No results for input(s): CKTOTAL, CKMB, CKMBINDEX, TROPONINI in the last 168 hours. BNP: Invalid input(s): POCBNP CBG: Recent Labs  Lab 08/07/20 1135 08/07/20 1656 08/07/20 2305 08/08/20 0741 08/08/20 1112  GLUCAP 117* 132* 150* 146* 188*   D-Dimer No results for input(s): DDIMER in the last 72 hours. Hgb A1c Recent Labs    08/07/20 0320  HGBA1C 7.8*   Lipid Profile No results for input(s): CHOL, HDL, LDLCALC, TRIG, CHOLHDL, LDLDIRECT in the last 72 hours. Thyroid function studies No results for input(s): TSH, T4TOTAL, T3FREE, THYROIDAB in the last 72 hours.  Invalid input(s): FREET3 Anemia work up No results for input(s): VITAMINB12, FOLATE, FERRITIN, TIBC, IRON, RETICCTPCT in the last 72 hours. Urinalysis    Component Value Date/Time   COLORURINE YELLOW 02/08/2020 1624   APPEARANCEUR CLEAR 02/08/2020 1624   LABSPEC 1.011 02/08/2020 1624   PHURINE 6.0 02/08/2020 1624   GLUCOSEU >=500 (A) 02/08/2020 1624   HGBUR LARGE (A) 02/08/2020 1624   BILIRUBINUR NEGATIVE 02/08/2020 1624   KETONESUR NEGATIVE 02/08/2020 1624   PROTEINUR NEGATIVE 02/08/2020 1624   UROBILINOGEN 0.2 12/12/2014 0150   NITRITE NEGATIVE 02/08/2020 1624   LEUKOCYTESUR NEGATIVE 02/08/2020 1624   Sepsis Labs Invalid input(s): PROCALCITONIN,  WBC,  LACTICIDVEN Microbiology Recent Results (from the past 240 hour(s))  Blood culture (routine x 2)     Status: None (Preliminary result)   Collection Time: 08/06/20 11:21 AM   Specimen: Right Antecubital; Blood  Result Value Ref Range Status   Specimen Description   Final    RIGHT ANTECUBITAL Performed at [facility], [address] [place],  [address]    Special Requests   Final  Blood Culture adequate volume BOTTLES DRAWN AEROBIC AND ANAEROBIC Performed at [facility], [address] [place], [place] [address]    Culture   Final    NO GROWTH 2  DAYS Performed at [facility], [place] [address] [place], [place] [address]    Report Status PENDING  Incomplete  Respiratory Panel by RT PCR (Flu A&B, Covid) - Nasopharyngeal Swab     Status: None   Collection Time: 08/06/20 11:21 AM   Specimen: Nasopharyngeal Swab  Result Value Ref Range Status   SARS Coronavirus 2 by RT PCR NEGATIVE NEGATIVE Final    Comment: (NOTE) SARS-CoV-2 target nucleic acids are NOT DETECTED.  The SARS-CoV-2 RNA is generally detectable in upper respiratoy specimens during the acute phase of infection. The lowest concentration of SARS-CoV-2 viral copies this assay can detect is 131 copies/mL. A negative result does not preclude SARS-Cov-2 infection and should not be used as the sole basis for treatment or other patient management decisions. A negative result may occur with  improper specimen collection/handling, submission of specimen other than nasopharyngeal swab, presence of viral mutation(s) within the areas targeted by this assay, and inadequate number of viral copies (<131 copies/mL). A negative result must be combined with clinical observations, patient history, and epidemiological information. The expected result is Negative.  Fact Sheet for Patients:  PinkCheek.be  Fact Sheet for Healthcare Providers:  GravelBags.it  This test is no t yet approved or cleared by the [place] FDA and  has been authorized for detection and/or diagnosis of SARS-CoV-2 by FDA under an Emergency Use Authorization (EUA). This EUA will remain  in effect (meaning this test can be used) for the duration of the COVID-19 declaration under Section 564(b)(1) of the Act, 21 U.S.C. section 360bbb-3(b)(1), unless the authorization is terminated or revoked sooner.     Influenza A by PCR NEGATIVE NEGATIVE Final   Influenza B by PCR NEGATIVE NEGATIVE Final    Comment: (NOTE) The Xpert Xpress SARS-CoV-2/FLU/RSV  assay is intended as an aid in  the diagnosis of influenza from Nasopharyngeal swab specimens and  should not be used as a sole basis for treatment. Nasal washings and  aspirates are unacceptable for Xpert Xpress SARS-CoV-2/FLU/RSV  testing.  Fact Sheet for Patients: PinkCheek.be  Fact Sheet for Healthcare Providers: GravelBags.it  This test is not yet approved or cleared by the [place] FDA and  has been authorized for detection and/or diagnosis of SARS-CoV-2 by  FDA under an Emergency Use Authorization (EUA). This EUA will remain  in effect (meaning this test can be used) for the duration of the  Covid-19 declaration under Section 564(b)(1) of the Act, 21  U.S.C. section 360bbb-3(b)(1), unless the authorization is  terminated or revoked. Performed at [facility], [address] [place], [place] [address]   Blood culture (routine x 2)     Status: None (Preliminary result)   Collection Time: 08/06/20 11:27 AM   Specimen: BLOOD RIGHT FOREARM  Result Value Ref Range Status   Specimen Description   Final    BLOOD RIGHT FOREARM Performed at [facility], [address] [place], [place] [address]    Special Requests   Final    Blood Culture adequate volume BOTTLES DRAWN AEROBIC AND ANAEROBIC Performed at [facility], [address] [place], [place] [address]    Culture   Final    NO GROWTH 2 DAYS Performed at [facility], [place] [address], [place], [place] [address]    Report Status PENDING  Incomplete   Time spent: 30 min  SIGNED:   Marylu Lund, MD  [facility] 08/08/2020, 3:54 PM  If 7PM-7AM, please contact night-coverage

## 2020-08-11 LAB — CULTURE, BLOOD (ROUTINE X 2)
Culture: NO GROWTH
Culture: NO GROWTH
Special Requests: ADEQUATE
Special Requests: ADEQUATE

## 2020-08-15 ENCOUNTER — Ambulatory Visit (INDEPENDENT_AMBULATORY_CARE_PROVIDER_SITE_OTHER): Payer: Medicare Other | Admitting: Orthopedic Surgery

## 2020-08-15 ENCOUNTER — Encounter: Payer: Self-pay | Admitting: Orthopedic Surgery

## 2020-08-15 DIAGNOSIS — L03031 Cellulitis of right toe: Secondary | ICD-10-CM | POA: Diagnosis not present

## 2020-08-17 ENCOUNTER — Encounter: Payer: Self-pay | Admitting: Orthopedic Surgery

## 2020-08-17 ENCOUNTER — Telehealth: Payer: Self-pay | Admitting: Orthopedic Surgery

## 2020-08-17 NOTE — Telephone Encounter (Signed)
I called and lm on vm to advise call and leave a message to advise what it is that they are needing.

## 2020-08-17 NOTE — Telephone Encounter (Signed)
Patient's wife called. She would like a call. Says patient was not able to get in to a rehab facility. Her call back number is [phone]

## 2020-08-17 NOTE — Progress Notes (Signed)
Office Visit Note   Patient: [name]           Date of Birth: 1941/02/03           MRN: [id number] Visit Date: 08/15/2020              Requested by: [name], MD [address] [place] [address] [place],  [place] [address] PCP: [name], MD  Chief Complaint  Patient presents with  . Right Foot - Wound Check      HPI: Patient is an 79 year old gentleman who was seen in follow-up for traumatic laceration right great toe and an ischemic ulcer over the lateral aspect of the second toe from buddy taping the 2 toes.  Patient has been wearing the compression stocking he states the swelling has decreased he has been on doxycycline patient states he was unsure of how to use the nitroglycerin patch so he has not been using it.  Assessment & Plan: Visit Diagnoses:  1. Cellulitis of toe of right foot     Plan: Circles were drawn on the patient's ankle do show where to apply the nitroglycerin patch change daily after washing with soap and water.  Patient will continue with the compression stocking.  Continue with the Trental.  Follow-Up Instructions: Return in about 2 weeks (around 08/29/2020).   Ortho Exam  Patient is alert, oriented, no adenopathy, well-dressed, normal affect, normal respiratory effort. Examination the traumatic laceration plantar aspect of the IP joint right great toe is stable it does not gape open there is no cellulitis no odor no drainage.  Patient does have ischemic changes over the lateral aspect of the second toe which is also stable there is no exposed bone or tendon.  Patient was examined and seen with family present and use of nitroglycerin patch and compression was reviewed.  Imaging: No results found. No images are attached to the encounter.  Labs: Lab Results  Component Value Date   HGBA1C 7.8 (H) 08/07/2020   HGBA1C 8.4 (H) 02/09/2020   HGBA1C 7.3 (H) 07/04/2019   REPTSTATUS 08/11/2020 FINAL 08/06/2020   CULT  08/06/2020    NO GROWTH 5  DAYS Performed at [facility], [place] [address], [place], [place] [address]      Lab Results  Component Value Date   ALBUMIN 3.2 (L) 08/07/2020   ALBUMIN 3.1 (L) 08/06/2020   ALBUMIN 2.4 (L) 02/11/2020   PREALBUMIN 8.4 (L) 04/12/2008    Lab Results  Component Value Date   MG 1.8 02/17/2020   MG 1.7 02/12/2020   MG 1.8 02/11/2020   No results found for: [facility]  Lab Results  Component Value Date   PREALBUMIN 8.4 (L) 04/12/2008   CBC EXTENDED Latest Ref Rng & Units 08/08/2020 08/07/2020 08/06/2020  WBC 4.0 - 10.5 K/uL 5.0 4.4 4.3  RBC 4.22 - 5.81 MIL/uL 3.73(L) 3.61(L) 3.64(L)  HGB 13.0 - 17.0 g/dL 11.7(L) 11.3(L) 11.3(L)  HCT 39 - 52 % 35.2(L) 34.6(L) 34.8(L)  PLT 150 - 400 K/uL 201 193 207  NEUTROABS 1.7 - 7.7 K/uL - - 2.6  LYMPHSABS 0.7 - 4.0 K/uL - - 0.8     There is no height or weight on file to calculate BMI.  Orders:  No orders of the defined types were placed in this encounter.  No orders of the defined types were placed in this encounter.    Procedures: No procedures performed  Clinical Data: No additional findings.  ROS:  All other systems  negative, except as noted in the HPI. Review of Systems  Objective: Vital Signs: There were no vitals taken for this visit.  Specialty Comments:  No specialty comments available.  PMFS History: Patient Active Problem List   Diagnosis Date Noted  . Cellulitis of toe of right foot   . Diabetic foot infection ([place]) 08/06/2020  . Osteomyelitis ([place]) 08/06/2020  . Acquired thrombophilia ([place]) 08/06/2020  . Ileus ([place]) 02/13/2020  . Acute colitis 02/08/2020  . Hypokalemia 07/08/2019  . Acute metabolic encephalopathy   . Orthostatic dizziness   . Postural dizziness with near syncope 07/04/2019  . Abdominal distention, non-gaseous 07/04/2019  . Change in mental status 07/04/2019  . Small bowel obstruction ([place]) 07/04/2019  . Postural dizziness with presyncope 05/09/2018  . Hypotension 05/08/2018   . On amiodarone therapy 08/26/2017  . Near syncope 06/11/2017  . Hypoxia   . Pneumonia of left lower lobe due to infectious organism   . Gastroenteritis and colitis, viral 04/27/2016  . Orthostasis 04/26/2016  . AKI (acute kidney injury) ([place]) 04/26/2016  . Lumbar radiculopathy, chronic 01/16/2016  . Hematoma of left kidney 11/01/2015  . Hyperlipidemia 12/12/2014  . SIRS (systemic inflammatory response syndrome) ([place]) 12/12/2014  . Influenza due to identified novel influenza A virus with other respiratory manifestations 11/07/2013  . Chronic anticoagulation 09/08/2013    Class: Chronic  . Coronary artery disease involving native coronary artery of native heart with angina pectoris ([place]) 08/16/2013    Class: Chronic  . Chronic combined systolic and diastolic heart failure (HCC) 08/16/2013    Class: Chronic  . Paroxysmal atrial fibrillation with RVR ([place]) 08/13/2013  . Paralytic ileus ([place]) 05/07/2012  . Nausea and vomiting 01/01/2012  . DM (diabetes mellitus), type 2, uncontrolled w/neurologic complication ([place]) 76/81/1572  . Hypertension   . AAA (abdominal aortic aneurysm) ([place])   . Acid reflux   . Sleep apnea    Past Medical History:  Diagnosis Date  . AAA (abdominal aortic aneurysm) ([place])    a. s/p repair 2009.  [name] Acid reflux    takes Prilosec and Protonix daily  . Arthritis    BacK  . CAD in native artery    a. a. prior inf MI 1993. b. PCI to LAD 05/1997. c. recurrent inferolateral MI complicated by V. fib arrest in 04/1998, prior PCI to OM1. d. 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  [name] Chronic combined systolic and diastolic CHF (congestive heart failure) ([place])   . Complication of anesthesia    -years ago hair fell out.;ileus after 2 of his surgeries  . Dry skin   . Foot drop, bilateral   . Hypercholesteremia   . Hypertension   . Ileus ([place])    After AAA  . Incisional hernia    "small, from AAA"  . Ischemic cardiomyopathy   . MI (myocardial infarction) ([place])   .  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 OSA on CPAP    uses CPAP--sleep study done at least 28yrs ago  . PAF (paroxysmal atrial fibrillation) ([place])   . Pain    back pain chronic- seen at pain clinic  . Plantar fasciitis    bilatetral  . Thrombocytopenia ([place])   . Type II diabetes mellitus (HCC)     Family History  Problem Relation Age of Onset  . Diabetes Mother   . Coronary artery disease Mother   . Diabetes Father   . Arthritis Father   . Rheum arthritis Father   . Coronary  artery disease Father     Past Surgical History:  Procedure Laterality Date  . ABDOMINAL AORTIC ANEURYSM REPAIR    . BACK SURGERY  2012-2013 X 3   Miminal Invasive x 3in [place].  [name] CARDIAC CATHETERIZATION  2009/2012  . CARDIOVERSION N/A 04/12/2015   Procedure: CARDIOVERSION;  Surgeon: Sueanne Margarita, MD;  Location: [facility];  Service: Cardiovascular;  Laterality: N/A;  . CARDIOVERSION N/A 12/26/2016   Procedure: CARDIOVERSION;  Surgeon: Sueanne Margarita, MD;  [facility];  Service: Cardiovascular;  Laterality: N/A;  . COLONOSCOPY    . CORONARY ANGIOPLASTY WITH STENT PLACEMENT  1992; 1997  . LAPAROSCOPIC CHOLECYSTECTOMY    . LUMBAR LAMINECTOMY/DECOMPRESSION MICRODISCECTOMY Right 01/16/2016   Procedure: Right Lumbar five-Sacral one Laminectomy;  Surgeon: Kristeen Miss, MD;  Location: [facility];  Service: Neurosurgery;  Laterality: Right;  Right L5-S1 Laminectomy   Social History   Occupational History  . Not on file  Tobacco Use  . Smoking status: Former Smoker    Packs/day: 1.00    Years: 33.00    Pack years: 33.00    Types: Cigarettes    Quit date: 01/01/1992    Years since quitting: 28.6  . Smokeless tobacco: Never Used  Vaping Use  . Vaping Use: Never used  Substance and Sexual Activity  . Alcohol use: Yes    Comment: 6/30;2017 "GLASS OF WINE Q COUPLE MONTHS, IF THAT"  . Drug use: No  . Sexual activity: Yes

## 2020-08-29 ENCOUNTER — Encounter: Payer: Self-pay | Admitting: Orthopedic Surgery

## 2020-08-29 ENCOUNTER — Other Ambulatory Visit: Payer: Self-pay | Admitting: Physician Assistant

## 2020-08-29 ENCOUNTER — Ambulatory Visit (INDEPENDENT_AMBULATORY_CARE_PROVIDER_SITE_OTHER): Payer: Medicare Other | Admitting: Physician Assistant

## 2020-08-29 DIAGNOSIS — L03031 Cellulitis of right toe: Secondary | ICD-10-CM | POA: Diagnosis not present

## 2020-08-29 MED ORDER — NITROGLYCERIN 0.2 MG/HR TD PT24
0.2000 mg | MEDICATED_PATCH | Freq: Every day | TRANSDERMAL | 12 refills | Status: DC
Start: 2020-08-29 — End: 2021-04-28

## 2020-08-29 NOTE — Progress Notes (Unsigned)
Office Visit Note   Patient: [name]           Date of Birth: August 16, 1941           MRN: [id number] Visit Date: 08/29/2020              Requested by: No referring provider defined for this encounter. PCP: [name], MD  No chief complaint on file.     HPI: Patient presents with his wife in follow-up for his right foot.  He has been followed by Dr. Sharol Given for a laceration on the plantar surface of his great toe as well as an ischemic ulcer on his 2nd toe.  He has had difficulty wearing the vive compression stocking as he and his wife just cannot get it on.  He is ambulating with a walker.  He feels he is doing much better.  He has been using the nitroglycerin patch.  He has no complaints  Assessment & Plan: Visit Diagnoses: No diagnosis found.  Plan: Follow-up in 2 weeks.  I told him if they bring their sock with him I can help him apply it and showed him how to apply it.  Follow-Up Instructions: No follow-ups on file.   Ortho Exam  Patient is alert, oriented, no adenopathy, well-dressed, normal affect, normal respiratory effort. Focused examination of his right foot laceration beneath the great toe is almost completely healed no drainage no cellulitis does not probe at all.  No signs of infection.  On the lateral side of the 2nd toe there is a heme healing ischemic ulcer.  It is scabbed over.  There is no surrounding cellulitis drainage or foul odor no evidence of infection  Imaging: No results found. No images are attached to the encounter.  Labs: Lab Results  Component Value Date   HGBA1C 7.8 (H) 08/07/2020   HGBA1C 8.4 (H) 02/09/2020   HGBA1C 7.3 (H) 07/04/2019   REPTSTATUS 08/11/2020 FINAL 08/06/2020   CULT  08/06/2020    NO GROWTH 5 DAYS Performed at [facility], [place] [address] [place], [place] [address]      Lab Results  Component Value Date   ALBUMIN 3.2 (L) 08/07/2020   ALBUMIN 3.1 (L) 08/06/2020   ALBUMIN 2.4 (L) 02/11/2020   PREALBUMIN 8.4  (L) 04/12/2008    Lab Results  Component Value Date   MG 1.8 02/17/2020   MG 1.7 02/12/2020   MG 1.8 02/11/2020   No results found for: [facility]  Lab Results  Component Value Date   PREALBUMIN 8.4 (L) 04/12/2008   CBC EXTENDED Latest Ref Rng & Units 08/08/2020 08/07/2020 08/06/2020  WBC 4.0 - 10.5 K/uL 5.0 4.4 4.3  RBC 4.22 - 5.81 MIL/uL 3.73(L) 3.61(L) 3.64(L)  HGB 13.0 - 17.0 g/dL 11.7(L) 11.3(L) 11.3(L)  HCT 39 - 52 % 35.2(L) 34.6(L) 34.8(L)  PLT 150 - 400 K/uL 201 193 207  NEUTROABS 1.7 - 7.7 K/uL - - 2.6  LYMPHSABS 0.7 - 4.0 K/uL - - 0.8     There is no height or weight on file to calculate BMI.  Orders:  No orders of the defined types were placed in this encounter.  Meds ordered this encounter  Medications  . nitroGLYCERIN (NITRODUR - DOSED IN MG/24 HR) 0.2 mg/hr patch    Sig: Place 1 patch (0.2 mg total) onto the skin daily.    Dispense:  30 patch    Refill:  12     Procedures: No procedures performed  Clinical  Data: No additional findings.  ROS:  All other systems negative, except as noted in the HPI. Review of Systems  Objective: Vital Signs: There were no vitals taken for this visit.  Specialty Comments:  No specialty comments available.  PMFS History: Patient Active Problem List   Diagnosis Date Noted  . Cellulitis of toe of right foot   . Diabetic foot infection ([place]) 08/06/2020  . Osteomyelitis ([place]) 08/06/2020  . Acquired thrombophilia ([place]) 08/06/2020  . Ileus ([place]) 02/13/2020  . Acute colitis 02/08/2020  . Hypokalemia 07/08/2019  . Acute metabolic encephalopathy   . Orthostatic dizziness   . Postural dizziness with near syncope 07/04/2019  . Abdominal distention, non-gaseous 07/04/2019  . Change in mental status 07/04/2019  . Small bowel obstruction ([place]) 07/04/2019  . Postural dizziness with presyncope 05/09/2018  . Hypotension 05/08/2018  . On amiodarone therapy 08/26/2017  . Near syncope 06/11/2017  . Hypoxia   . Pneumonia  of left lower lobe due to infectious organism   . Gastroenteritis and colitis, viral 04/27/2016  . Orthostasis 04/26/2016  . AKI (acute kidney injury) ([place]) 04/26/2016  . Lumbar radiculopathy, chronic 01/16/2016  . Hematoma of left kidney 11/01/2015  . Hyperlipidemia 12/12/2014  . SIRS (systemic inflammatory response syndrome) ([employer]) 12/12/2014  . Influenza due to identified novel influenza A virus with other respiratory manifestations 11/07/2013  . Chronic anticoagulation 09/08/2013    Class: Chronic  . Coronary artery disease involving native coronary artery of native heart with angina pectoris ([place]) 08/16/2013    Class: Chronic  . Chronic combined systolic and diastolic heart failure (HCC) 08/16/2013    Class: Chronic  . Paroxysmal atrial fibrillation with RVR ([place]) 08/13/2013  . Paralytic ileus ([place]) 05/07/2012  . Nausea and vomiting 01/01/2012  . DM (diabetes mellitus), type 2, uncontrolled w/neurologic complication ([place]) 42/35/3614  . Hypertension   . AAA (abdominal aortic aneurysm) ([place])   . Acid reflux   . Sleep apnea    Past Medical History:  Diagnosis Date  . AAA (abdominal aortic aneurysm) ([place])    a. s/p repair 2009.  [name] Acid reflux    takes Prilosec and Protonix daily  . Arthritis    BacK  . CAD in native artery    a. a. prior inf MI 1993. b. PCI to LAD 05/1997. c. recurrent inferolateral MI complicated by V. fib arrest in 04/1998, prior PCI to OM1. d. 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  [name] Chronic combined systolic and diastolic CHF (congestive heart failure) ([place])   . Complication of anesthesia    -years ago hair fell out.;ileus after 2 of his surgeries  . Dry skin   . Foot drop, bilateral   . Hypercholesteremia   . Hypertension   . Ileus ([place])    After AAA  . Incisional hernia    "small, from AAA"  . Ischemic cardiomyopathy   . MI (myocardial infarction) ([place])   . 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 OSA on CPAP    uses CPAP--sleep study done at least 20yrs ago    . PAF (paroxysmal atrial fibrillation) ([place])   . Pain    back pain chronic- seen at pain clinic  . Plantar fasciitis    bilatetral  . Thrombocytopenia ([place])   . Type II diabetes mellitus (HCC)     Family History  Problem Relation Age of Onset  . Diabetes Mother   . Coronary artery disease Mother   . Diabetes Father   . Arthritis  Father   . Rheum arthritis Father   . Coronary artery disease Father     Past Surgical History:  Procedure Laterality Date  . ABDOMINAL AORTIC ANEURYSM REPAIR    . BACK SURGERY  2012-2013 X 3   Miminal Invasive x 3in [place].  [name] CARDIAC CATHETERIZATION  2009/2012  . CARDIOVERSION N/A 04/12/2015   Procedure: CARDIOVERSION;  Surgeon: Sueanne Margarita, MD;  Location: [facility];  Service: Cardiovascular;  Laterality: N/A;  . CARDIOVERSION N/A 12/26/2016   Procedure: CARDIOVERSION;  Surgeon: Sueanne Margarita, MD;  [facility];  Service: Cardiovascular;  Laterality: N/A;  . COLONOSCOPY    . CORONARY ANGIOPLASTY WITH STENT PLACEMENT  1992; 1997  . LAPAROSCOPIC CHOLECYSTECTOMY    . LUMBAR LAMINECTOMY/DECOMPRESSION MICRODISCECTOMY Right 01/16/2016   Procedure: Right Lumbar five-Sacral one Laminectomy;  Surgeon: Kristeen Miss, MD;  Location: [facility];  Service: Neurosurgery;  Laterality: Right;  Right L5-S1 Laminectomy   Social History   Occupational History  . Not on file  Tobacco Use  . Smoking status: Former Smoker    Packs/day: 1.00    Years: 33.00    Pack years: 33.00    Types: Cigarettes    Quit date: 01/01/1992    Years since quitting: 28.6  . Smokeless tobacco: Never Used  Vaping Use  . Vaping Use: Never used  Substance and Sexual Activity  . Alcohol use: Yes    Comment: 6/30;2017 "GLASS OF WINE Q COUPLE MONTHS, IF THAT"  . Drug use: No  . Sexual activity: Yes

## 2020-09-12 ENCOUNTER — Encounter: Payer: Self-pay | Admitting: Orthopedic Surgery

## 2020-09-12 ENCOUNTER — Ambulatory Visit: Payer: Medicare Other | Admitting: Orthopedic Surgery

## 2020-09-12 VITALS — Ht 72.0 in | Wt 202.0 lb

## 2020-09-12 DIAGNOSIS — L03031 Cellulitis of right toe: Secondary | ICD-10-CM

## 2020-09-14 ENCOUNTER — Encounter: Payer: Self-pay | Admitting: Orthopedic Surgery

## 2020-09-14 NOTE — Progress Notes (Signed)
Office Visit Note   Patient: [name]           Date of Birth: October 12, 1941           MRN: [id number] Visit Date: 09/12/2020              Requested by: [name], MD [address] [place] [address] [place],  [place] [address] PCP: [name], MD  Chief Complaint  Patient presents with  . Right Foot - Follow-up    GT laceration       HPI: Patient is a 79 year old gentleman who presents in follow-up for ischemic ulcerations to the right great toe and second toe.  He states that there is scabbing over the second toe at this time.  Patient has been using [place] tall and a nitroglycerin patch.  Assessment & Plan: Visit Diagnoses:  1. Cellulitis of toe of right foot     Plan: We will have him continue with the nitroglycerin patch and Trental.  Recommend he continue wearing a knee-high compression stocking.  Follow-Up Instructions: Return in about 4 weeks (around 10/10/2020).   Ortho Exam  Patient is alert, oriented, no adenopathy, well-dressed, normal affect, normal respiratory effort. Examination patient is a good dorsalis pedis pulse the ischemic ulcer to the second toe has improved significantly there is no sausage digit swelling no cellulitis no drainage.  He does have pitting edema with venous insufficiency but no venous ulcers or drainage.  Imaging: No results found. No images are attached to the encounter.  Labs: Lab Results  Component Value Date   HGBA1C 7.8 (H) 08/07/2020   HGBA1C 8.4 (H) 02/09/2020   HGBA1C 7.3 (H) 07/04/2019   REPTSTATUS 08/11/2020 FINAL 08/06/2020   CULT  08/06/2020    NO GROWTH 5 DAYS Performed at [facility], [place] [address], [place], [place] [address]      Lab Results  Component Value Date   ALBUMIN 3.2 (L) 08/07/2020   ALBUMIN 3.1 (L) 08/06/2020   ALBUMIN 2.4 (L) 02/11/2020   PREALBUMIN 8.4 (L) 04/12/2008    Lab Results  Component Value Date   MG 1.8 02/17/2020   MG 1.7 02/12/2020   MG 1.8 02/11/2020   No results  found for: [facility]  Lab Results  Component Value Date   PREALBUMIN 8.4 (L) 04/12/2008   CBC EXTENDED Latest Ref Rng & Units 08/08/2020 08/07/2020 08/06/2020  WBC 4.0 - 10.5 K/uL 5.0 4.4 4.3  RBC 4.22 - 5.81 MIL/uL 3.73(L) 3.61(L) 3.64(L)  HGB 13.0 - 17.0 g/dL 11.7(L) 11.3(L) 11.3(L)  HCT 39 - 52 % 35.2(L) 34.6(L) 34.8(L)  PLT 150 - 400 K/uL 201 193 207  NEUTROABS 1.7 - 7.7 K/uL - - 2.6  LYMPHSABS 0.7 - 4.0 K/uL - - 0.8     Body mass index is 27.4 kg/m.  Orders:  No orders of the defined types were placed in this encounter.  No orders of the defined types were placed in this encounter.    Procedures: No procedures performed  Clinical Data: No additional findings.  ROS:  All other systems negative, except as noted in the HPI. Review of Systems  Objective: Vital Signs: Ht 6' (1.829 m)   Wt 202 lb (91.6 kg)   BMI 27.40 kg/m   Specialty Comments:  No specialty comments available.  PMFS History: Patient Active Problem List   Diagnosis Date Noted  . Cellulitis of toe of right foot   . Diabetic foot infection ([place]) 08/06/2020  . Osteomyelitis ([place])  08/06/2020  . Acquired thrombophilia ([place]) 08/06/2020  . Ileus ([place]) 02/13/2020  . Acute colitis 02/08/2020  . Hypokalemia 07/08/2019  . Acute metabolic encephalopathy   . Orthostatic dizziness   . Postural dizziness with near syncope 07/04/2019  . Abdominal distention, non-gaseous 07/04/2019  . Change in mental status 07/04/2019  . Small bowel obstruction ([place]) 07/04/2019  . Postural dizziness with presyncope 05/09/2018  . Hypotension 05/08/2018  . On amiodarone therapy 08/26/2017  . Near syncope 06/11/2017  . Hypoxia   . Pneumonia of left lower lobe due to infectious organism   . Gastroenteritis and colitis, viral 04/27/2016  . Orthostasis 04/26/2016  . AKI (acute kidney injury) ([place]) 04/26/2016  . Lumbar radiculopathy, chronic 01/16/2016  . Hematoma of left kidney 11/01/2015  . Hyperlipidemia 12/12/2014   . SIRS (systemic inflammatory response syndrome) ([place]) 12/12/2014  . Influenza due to identified novel influenza A virus with other respiratory manifestations 11/07/2013  . Chronic anticoagulation 09/08/2013    Class: Chronic  . Coronary artery disease involving native coronary artery of native heart with angina pectoris ([place]) 08/16/2013    Class: Chronic  . Chronic combined systolic and diastolic heart failure (HCC) 08/16/2013    Class: Chronic  . Paroxysmal atrial fibrillation with RVR ([place]) 08/13/2013  . Paralytic ileus ([place]) 05/07/2012  . Nausea and vomiting 01/01/2012  . DM (diabetes mellitus), type 2, uncontrolled w/neurologic complication ([place]) 50/35/4656  . Hypertension   . AAA (abdominal aortic aneurysm) ([place])   . Acid reflux   . Sleep apnea    Past Medical History:  Diagnosis Date  . AAA (abdominal aortic aneurysm) ([place])    a. s/p repair 2009.  [name] Acid reflux    takes Prilosec and Protonix daily  . Arthritis    BacK  . CAD in native artery    a. a. prior inf MI 1993. b. PCI to LAD 05/1997. c. recurrent inferolateral MI complicated by V. fib arrest in 04/1998, prior PCI to OM1. d. 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  [name] Chronic combined systolic and diastolic CHF (congestive heart failure) ([place])   . Complication of anesthesia    -years ago hair fell out.;ileus after 2 of his surgeries  . Dry skin   . Foot drop, bilateral   . Hypercholesteremia   . Hypertension   . Ileus ([place])    After AAA  . Incisional hernia    "small, from AAA"  . Ischemic cardiomyopathy   . MI (myocardial infarction) ([place])   . 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 OSA on CPAP    uses CPAP--sleep study done at least 3yrs ago  . PAF (paroxysmal atrial fibrillation) ([place])   . Pain    back pain chronic- seen at pain clinic  . Plantar fasciitis    bilatetral  . Thrombocytopenia ([place])   . Type II diabetes mellitus (HCC)     Family History  Problem Relation Age of Onset  . Diabetes Mother   .  Coronary artery disease Mother   . Diabetes Father   . Arthritis Father   . Rheum arthritis Father   . Coronary artery disease Father     Past Surgical History:  Procedure Laterality Date  . ABDOMINAL AORTIC ANEURYSM REPAIR    . BACK SURGERY  2012-2013 X 3   Miminal Invasive x 3in [place].  [name] CARDIAC CATHETERIZATION  2009/2012  . CARDIOVERSION N/A 04/12/2015   Procedure: CARDIOVERSION;  Surgeon: Sueanne Margarita, MD;  Location: [facility];  Service: Cardiovascular;  Laterality: N/A;  . CARDIOVERSION N/A 12/26/2016   Procedure: CARDIOVERSION;  Surgeon: Sueanne Margarita, MD;  [facility];  Service: Cardiovascular;  Laterality: N/A;  . COLONOSCOPY    . CORONARY ANGIOPLASTY WITH STENT PLACEMENT  1992; 1997  . LAPAROSCOPIC CHOLECYSTECTOMY    . LUMBAR LAMINECTOMY/DECOMPRESSION MICRODISCECTOMY Right 01/16/2016   Procedure: Right Lumbar five-Sacral one Laminectomy;  Surgeon: Kristeen Miss, MD;  Location: [facility];  Service: Neurosurgery;  Laterality: Right;  Right L5-S1 Laminectomy   Social History   Occupational History  . Not on file  Tobacco Use  . Smoking status: Former Smoker    Packs/day: 1.00    Years: 33.00    Pack years: 33.00    Types: Cigarettes    Quit date: 01/01/1992    Years since quitting: 28.7  . Smokeless tobacco: Never Used  Vaping Use  . Vaping Use: Never used  Substance and Sexual Activity  . Alcohol use: Yes    Comment: 6/30;2017 "GLASS OF WINE Q COUPLE MONTHS, IF THAT"  . Drug use: No  . Sexual activity: Yes

## 2020-11-28 ENCOUNTER — Ambulatory Visit: Payer: Medicare Other | Admitting: Orthopedic Surgery

## 2020-12-01 DIAGNOSIS — E1140 Type 2 diabetes mellitus with diabetic neuropathy, unspecified: Secondary | ICD-10-CM | POA: Diagnosis not present

## 2020-12-01 DIAGNOSIS — I25119 Atherosclerotic heart disease of native coronary artery with unspecified angina pectoris: Secondary | ICD-10-CM | POA: Diagnosis not present

## 2020-12-01 DIAGNOSIS — K219 Gastro-esophageal reflux disease without esophagitis: Secondary | ICD-10-CM | POA: Diagnosis not present

## 2020-12-01 DIAGNOSIS — E1165 Type 2 diabetes mellitus with hyperglycemia: Secondary | ICD-10-CM | POA: Diagnosis not present

## 2020-12-01 DIAGNOSIS — I10 Essential (primary) hypertension: Secondary | ICD-10-CM | POA: Diagnosis not present

## 2020-12-01 DIAGNOSIS — G629 Polyneuropathy, unspecified: Secondary | ICD-10-CM | POA: Diagnosis not present

## 2020-12-15 ENCOUNTER — Emergency Department (HOSPITAL_COMMUNITY): Payer: Medicare Other

## 2020-12-15 ENCOUNTER — Emergency Department (HOSPITAL_COMMUNITY)
Admission: EM | Admit: 2020-12-15 | Discharge: 2020-12-15 | Disposition: A | Discharge status: Home / Self Care | Payer: Medicare Other | Attending: Emergency Medicine | Admitting: Emergency Medicine

## 2020-12-15 ENCOUNTER — Encounter (HOSPITAL_COMMUNITY): Payer: Self-pay | Admitting: Emergency Medicine

## 2020-12-15 DIAGNOSIS — E1140 Type 2 diabetes mellitus with diabetic neuropathy, unspecified: Secondary | ICD-10-CM | POA: Insufficient documentation

## 2020-12-15 DIAGNOSIS — I5042 Chronic combined systolic (congestive) and diastolic (congestive) heart failure: Secondary | ICD-10-CM | POA: Insufficient documentation

## 2020-12-15 DIAGNOSIS — I110 Hypertensive heart disease with heart failure: Secondary | ICD-10-CM | POA: Insufficient documentation

## 2020-12-15 DIAGNOSIS — Z87891 Personal history of nicotine dependence: Secondary | ICD-10-CM | POA: Insufficient documentation

## 2020-12-15 DIAGNOSIS — I2510 Atherosclerotic heart disease of native coronary artery without angina pectoris: Secondary | ICD-10-CM | POA: Insufficient documentation

## 2020-12-15 DIAGNOSIS — L03116 Cellulitis of left lower limb: Secondary | ICD-10-CM | POA: Diagnosis not present

## 2020-12-15 DIAGNOSIS — Z20822 Contact with and (suspected) exposure to covid-19: Secondary | ICD-10-CM | POA: Insufficient documentation

## 2020-12-15 DIAGNOSIS — Z7984 Long term (current) use of oral hypoglycemic drugs: Secondary | ICD-10-CM | POA: Diagnosis not present

## 2020-12-15 DIAGNOSIS — E1165 Type 2 diabetes mellitus with hyperglycemia: Secondary | ICD-10-CM | POA: Diagnosis not present

## 2020-12-15 DIAGNOSIS — L0390 Cellulitis, unspecified: Secondary | ICD-10-CM | POA: Diagnosis not present

## 2020-12-15 DIAGNOSIS — R531 Weakness: Secondary | ICD-10-CM | POA: Diagnosis not present

## 2020-12-15 DIAGNOSIS — R6889 Other general symptoms and signs: Secondary | ICD-10-CM | POA: Diagnosis not present

## 2020-12-15 DIAGNOSIS — Z955 Presence of coronary angioplasty implant and graft: Secondary | ICD-10-CM | POA: Diagnosis not present

## 2020-12-15 DIAGNOSIS — R4182 Altered mental status, unspecified: Secondary | ICD-10-CM | POA: Insufficient documentation

## 2020-12-15 DIAGNOSIS — Z79899 Other long term (current) drug therapy: Secondary | ICD-10-CM | POA: Insufficient documentation

## 2020-12-15 DIAGNOSIS — R059 Cough, unspecified: Secondary | ICD-10-CM | POA: Diagnosis not present

## 2020-12-15 DIAGNOSIS — R410 Disorientation, unspecified: Secondary | ICD-10-CM | POA: Diagnosis not present

## 2020-12-15 DIAGNOSIS — Z743 Need for continuous supervision: Secondary | ICD-10-CM | POA: Diagnosis not present

## 2020-12-15 DIAGNOSIS — R7989 Other specified abnormal findings of blood chemistry: Secondary | ICD-10-CM | POA: Diagnosis not present

## 2020-12-15 DIAGNOSIS — I499 Cardiac arrhythmia, unspecified: Secondary | ICD-10-CM | POA: Diagnosis not present

## 2020-12-15 DIAGNOSIS — R2681 Unsteadiness on feet: Secondary | ICD-10-CM | POA: Diagnosis not present

## 2020-12-15 DIAGNOSIS — J811 Chronic pulmonary edema: Secondary | ICD-10-CM | POA: Diagnosis not present

## 2020-12-15 DIAGNOSIS — I10 Essential (primary) hypertension: Secondary | ICD-10-CM | POA: Diagnosis not present

## 2020-12-15 LAB — URINALYSIS, ROUTINE W REFLEX MICROSCOPIC
Bacteria, UA: NONE SEEN
Bilirubin Urine: NEGATIVE
Glucose, UA: >=500 mg/dL — AB
Ketones, ur: NEGATIVE mg/dL
Leukocytes,Ua: NEGATIVE
Nitrite: NEGATIVE
Protein, ur: NEGATIVE mg/dL
Specific Gravity, Urine: 1.011 (ref 1.005–1.030)
pH: 6 (ref 5.0–8.0)

## 2020-12-15 LAB — CBC WITH DIFFERENTIAL/PLATELET
Abs Immature Granulocytes: 0.17 10*3/uL — ABNORMAL HIGH (ref 0.00–0.07)
Basophils Absolute: 0 10*3/uL (ref 0.0–0.1)
Basophils Relative: 0 %
Eosinophils Absolute: 0 10*3/uL (ref 0.0–0.5)
Eosinophils Relative: 0 %
HCT: 35.2 % — ABNORMAL LOW (ref 39.0–52.0)
Hemoglobin: 11.9 g/dL — ABNORMAL LOW (ref 13.0–17.0)
Immature Granulocytes: 2 %
Lymphocytes Relative: 14 %
Lymphs Abs: 1.2 10*3/uL (ref 0.7–4.0)
MCH: 31.6 pg (ref 26.0–34.0)
MCHC: 33.8 g/dL (ref 30.0–36.0)
MCV: 93.4 fL (ref 80.0–100.0)
Monocytes Absolute: 2.2 10*3/uL — ABNORMAL HIGH (ref 0.1–1.0)
Monocytes Relative: 26 %
Neutro Abs: 5 10*3/uL (ref 1.7–7.7)
Neutrophils Relative %: 58 %
Platelets: 160 10*3/uL (ref 150–400)
RBC: 3.77 MIL/uL — ABNORMAL LOW (ref 4.22–5.81)
RDW: 14.6 % (ref 11.5–15.5)
WBC: 8.6 10*3/uL (ref 4.0–10.5)
nRBC: 0 % (ref 0.0–0.2)

## 2020-12-15 LAB — RAPID URINE DRUG SCREEN, HOSP PERFORMED
Amphetamines: NOT DETECTED
Barbiturates: NOT DETECTED
Benzodiazepines: POSITIVE — AB
Cocaine: NOT DETECTED
Opiates: NOT DETECTED
Tetrahydrocannabinol: NOT DETECTED

## 2020-12-15 LAB — COMPREHENSIVE METABOLIC PANEL
ALT: 20 U/L (ref 0–44)
AST: 17 U/L (ref 15–41)
Albumin: 3.9 g/dL (ref 3.5–5.0)
Alkaline Phosphatase: 62 U/L (ref 38–126)
Anion gap: 11 (ref 5–15)
BUN: 33 mg/dL — ABNORMAL HIGH (ref 8–23)
CO2: 28 mmol/L (ref 22–32)
Calcium: 9.3 mg/dL (ref 8.9–10.3)
Chloride: 99 mmol/L (ref 98–111)
Creatinine, Ser: 1.42 mg/dL — ABNORMAL HIGH (ref 0.61–1.24)
GFR, Estimated: 50 mL/min — ABNORMAL LOW (ref >60)
Glucose, Bld: 186 mg/dL — ABNORMAL HIGH (ref 70–99)
Potassium: 3.9 mmol/L (ref 3.5–5.1)
Sodium: 138 mmol/L (ref 135–145)
Total Bilirubin: 0.8 mg/dL (ref 0.3–1.2)
Total Protein: 7.6 g/dL (ref 6.5–8.1)

## 2020-12-15 LAB — ETHANOL: Alcohol, Ethyl (B): <10 mg/dL (ref <10)

## 2020-12-15 LAB — RESP PANEL BY RT-PCR (FLU A&B, COVID) ARPGX2
Influenza A by PCR: NEGATIVE
Influenza B by PCR: NEGATIVE
SARS Coronavirus 2 by RT PCR: NEGATIVE

## 2020-12-15 LAB — BRAIN NATRIURETIC PEPTIDE: B Natriuretic Peptide: 142.7 pg/mL — ABNORMAL HIGH (ref 0.0–100.0)

## 2020-12-15 MED ORDER — CEPHALEXIN 500 MG PO CAPS: 500.0000 mg | ORAL_CAPSULE | Freq: Three times a day (TID) | ORAL | 0 refills | Status: DC

## 2020-12-15 MED ORDER — SODIUM CHLORIDE 0.9 % IV SOLN: INTRAVENOUS | Status: DC

## 2020-12-15 NOTE — ED Provider Notes (Signed)
[facility] DEPT Provider Note   CSN: [id number] Arrival date & time: 12/15/20  1146     History Chief Complaint  Patient presents with  . Weakness    [name] is a 80 y.o. male.  80 year old male who presents due to altered mental status.  Patient symptoms began last night proximally 7 PM according to his doctor's note.  He had diffuse weakness and slumped to the ground.  No reported history of trauma.  Patient did take his normal nightly opiate and benzo but states that those doses have been the same.  He denies any recent fever, cough, urinary symptoms.  He saw his physician today for similar complaints he was sent here for further management.        Past Medical History:  Diagnosis Date  . AAA (abdominal aortic aneurysm) ([place])    a. s/p repair 2009.  [name] Acid reflux    takes Prilosec and Protonix daily  . Arthritis    BacK  . CAD in native artery    a. a. prior inf MI 1993. b. PCI to LAD 05/1997. c. recurrent inferolateral MI complicated by V. fib arrest in 04/1998, prior PCI to OM1. d. 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  [name] Chronic combined systolic and diastolic CHF (congestive heart failure) ([place])   . Complication of anesthesia    -years ago hair fell out.;ileus after 2 of his surgeries  . Dry skin   . Foot drop, bilateral   . Hypercholesteremia   . Hypertension   . Ileus ([place])    After AAA  . Incisional hernia    "small, from AAA"  . Ischemic cardiomyopathy   . MI (myocardial infarction) ([place])   . 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 OSA on CPAP    uses CPAP--sleep study done at least 28yrs ago  . PAF (paroxysmal atrial fibrillation) ([place])   . Pain    back pain chronic- seen at pain clinic  . Plantar fasciitis    bilatetral  . Thrombocytopenia ([place])   . Type II diabetes mellitus [facility])     Patient Active Problem List   Diagnosis Date Noted  . Cellulitis of toe of right foot   . Diabetic foot infection ([place]) 08/06/2020  .  Osteomyelitis ([place]) 08/06/2020  . Acquired thrombophilia ([place]) 08/06/2020  . Ileus ([place]) 02/13/2020  . Acute colitis 02/08/2020  . Hypokalemia 07/08/2019  . Acute metabolic encephalopathy   . Orthostatic dizziness   . Postural dizziness with near syncope 07/04/2019  . Abdominal distention, non-gaseous 07/04/2019  . Change in mental status 07/04/2019  . Small bowel obstruction ([place]) 07/04/2019  . Postural dizziness with presyncope 05/09/2018  . Hypotension 05/08/2018  . On amiodarone therapy 08/26/2017  . Near syncope 06/11/2017  . Hypoxia   . Pneumonia of left lower lobe due to infectious organism   . Gastroenteritis and colitis, viral 04/27/2016  . Orthostasis 04/26/2016  . AKI (acute kidney injury) ([place]) 04/26/2016  . Lumbar radiculopathy, chronic 01/16/2016  . Hematoma of left kidney 11/01/2015  . Hyperlipidemia 12/12/2014  . SIRS (systemic inflammatory response syndrome) ([place]) 12/12/2014  . Influenza due to identified novel influenza A virus with other respiratory manifestations 11/07/2013  . Chronic anticoagulation 09/08/2013    Class: Chronic  . Coronary artery disease involving native coronary artery of native heart with angina pectoris ([place]) 08/16/2013    Class: Chronic  . Chronic combined systolic and diastolic heart failure ([place]) 08/16/2013  Class: Chronic  . Paroxysmal atrial fibrillation with RVR ([place]) 08/13/2013  . Paralytic ileus ([place]) 05/07/2012  . Nausea and vomiting 01/01/2012  . DM (diabetes mellitus), type 2, uncontrolled w/neurologic complication ([place]) 14/43/1540  . Hypertension   . AAA (abdominal aortic aneurysm) ([place])   . Acid reflux   . Sleep apnea     Past Surgical History:  Procedure Laterality Date  . ABDOMINAL AORTIC ANEURYSM REPAIR    . BACK SURGERY  2012-2013 X 3   Miminal Invasive x 3in [place].  [name] CARDIAC CATHETERIZATION  2009/2012  . CARDIOVERSION N/A 04/12/2015   Procedure: CARDIOVERSION;  Surgeon: Sueanne Margarita, MD;  Location: [facility];  Service: Cardiovascular;  Laterality: N/A;  . CARDIOVERSION N/A 12/26/2016   Procedure: CARDIOVERSION;  Surgeon: Sueanne Margarita, MD;  [facility];  Service: Cardiovascular;  Laterality: N/A;  . COLONOSCOPY    . CORONARY ANGIOPLASTY WITH STENT PLACEMENT  1992; 1997  . LAPAROSCOPIC CHOLECYSTECTOMY    . LUMBAR LAMINECTOMY/DECOMPRESSION MICRODISCECTOMY Right 01/16/2016   Procedure: Right Lumbar five-Sacral one Laminectomy;  Surgeon: Kristeen Miss, MD;  Location: [facility]S;  Service: Neurosurgery;  Laterality: Right;  Right L5-S1 Laminectomy       Family History  Problem Relation Age of Onset  . Diabetes Mother   . Coronary artery disease Mother   . Diabetes Father   . Arthritis Father   . Rheum arthritis Father   . Coronary artery disease Father     Social History   Tobacco Use  . Smoking status: Former Smoker    Packs/day: 1.00    Years: 33.00    Pack years: 33.00    Types: Cigarettes    Quit date: 01/01/1992    Years since quitting: 28.9  . Smokeless tobacco: Never Used  Vaping Use  . Vaping Use: Never used  Substance Use Topics  . Alcohol use: Yes    Comment: 6/30;2017 "GLASS OF WINE Q COUPLE MONTHS, IF THAT"  . Drug use: No    Home Medications Prior to Admission medications   Medication Sig Start Date End Date Taking? Authorizing Provider  amiodarone (PACERONE) 200 MG tablet Take 1 tablet (200 mg total) by mouth daily. 03/31/20   Belva Crome, MD  amitriptyline (ELAVIL) 25 MG tablet Take 50 mg by mouth at bedtime.  12/11/18   [provider]  ascorbic acid ([employer] VITAMIN C) 500 MG tablet Take 500 mg by mouth daily.    [provider]  atorvastatin (LIPITOR) 10 MG tablet Take 10 mg by mouth daily.    [provider]  cholecalciferol (VITAMIN D3) 25 MCG (1000 UNIT) tablet Take 1,000 Units by mouth daily.    [provider]  Coenzyme Q10 (CO Q 10) 100 MG CAPS Take 100 mg by mouth daily.    [provider]   diazepam (VALIUM) 5 MG tablet Take 5 mg by mouth at bedtime as needed for anxiety.  10/31/15   [provider]  doxycycline (VIBRA-TABS) 100 MG tablet Take 1 tablet (100 mg total) by mouth 2 (two) times daily. 08/08/20   Newt Minion, MD  empagliflozin (JARDIANCE) 10 MG TABS tablet Take 10 mg by mouth daily with breakfast.     [provider]  furosemide (LASIX) 40 MG tablet Take 40 mg by mouth daily as needed for fluid.  01/11/20   [provider]  glimepiride (AMARYL) 2 MG tablet Take 2 mg by mouth every morning. 08/03/20   [provider]  metFORMIN (GLUCOPHAGE-XR) 500 MG 24 hr tablet Take 500-1,000 mg by mouth See admin instructions. 1,000 mg in the morning and 500 mg in the evening 10/12/15   [provider]  metoprolol tartrate (LOPRESSOR) 50 MG tablet Take 1 tablet by mouth twice daily 06/30/20   Belva Crome, MD  Multiple Vitamins-Minerals (ONE-A-DAY MENS 50+ ADVANTAGE) TABS Take 1 tablet by mouth daily.    [provider]  nitroGLYCERIN (NITRODUR - DOSED IN MG/24 HR) 0.2 mg/hr patch Place 1 patch (0.2 mg total) onto the skin daily. 08/29/20   Persons, Bevely Palmer, PA  Olopatadine HCl (PATADAY OP) Place 1 drop into both eyes daily.    [provider]  oxyCODONE-acetaminophen (PERCOCET) 7.5-325 MG tablet Take 1 tablet by mouth every 6 (six) hours as needed for moderate pain or severe pain.  07/20/20   [provider]  pentoxifylline (TRENTAL) 400 MG CR tablet Take 1 tablet (400 mg total) by mouth 3 (three) times daily with meals. 08/08/20   Newt Minion, MD  pregabalin (LYRICA) 200 MG capsule Take 200 mg by mouth at bedtime. 08/04/20   [provider]  pregabalin (LYRICA) 300 MG capsule Take 1 capsule by mouth 2 (two) times a day. 02/16/19   [provider]  rivaroxaban (XARELTO) 20 MG TABS tablet Take 20 mg by mouth every evening.     [provider]  trolamine salicylate (ASPERCREME) 10 % cream  Apply 1 application topically as needed for muscle pain.    [provider]  vitamin B-12 (CYANOCOBALAMIN) 1000 MCG tablet Take 1,000 mcg by mouth daily.     [provider]    Allergies    Cymbalta [duloxetine hcl], Neurontin [gabapentin], and Keppra [levetiracetam]  Review of Systems   Review of Systems  All other systems reviewed and are negative.   Physical Exam Updated Vital Signs BP 122/67 (BP Location: Right Arm)   Pulse 62   Temp 97.7 F (36.5 C) (Oral)   Resp 13   SpO2 90%   Physical Exam Vitals and nursing note reviewed.  Constitutional:      General: He is not in acute distress.    Appearance: Normal appearance. He is well-developed and well-nourished. He is not toxic-appearing.  HENT:     Head: Normocephalic and atraumatic.  Eyes:     General: Lids are normal.     Extraocular Movements: EOM normal.     Conjunctiva/sclera: Conjunctivae normal.     Pupils: Pupils are equal, round, and reactive to light.  Neck:     Thyroid: No thyroid mass.     Trachea: No tracheal deviation.  Cardiovascular:     Rate and Rhythm: Normal rate and regular rhythm.     Heart sounds: Normal heart sounds. No murmur heard. No gallop.   Pulmonary:     Effort: Pulmonary effort is normal. No respiratory distress.     Breath sounds: Normal breath sounds. No stridor. No decreased breath sounds, wheezing, rhonchi or rales.  Abdominal:     General: Bowel sounds are normal. There is no distension.     Palpations: Abdomen is soft.     Tenderness: There is no abdominal tenderness. There is no CVA tenderness or rebound.  Musculoskeletal:        General: No tenderness or edema. Normal range of motion.     Cervical back: Normal range of motion and neck supple.       Legs:  Skin:    General: Skin is warm  and dry.     Findings: No abrasion or rash.  Neurological:     General: No focal deficit present.     Mental Status: He is alert and oriented to person, place, and time.      GCS: GCS eye subscore is 4. GCS verbal subscore is 5. GCS motor subscore is 6.     Cranial Nerves: No cranial nerve deficit.     Sensory: No sensory deficit.     Motor: Motor function is intact.     Deep Tendon Reflexes: Strength normal.  Psychiatric:        Attention and Perception: Attention normal.        Mood and Affect: Mood and affect normal.        Speech: Speech normal.        Behavior: Behavior normal.     ED Results / Procedures / Treatments   Labs (all labs ordered are listed, but only abnormal results are displayed) Labs Reviewed  URINE CULTURE  RESP PANEL BY RT-PCR (FLU A&B, COVID) ARPGX2  CBC WITH DIFFERENTIAL/PLATELET  COMPREHENSIVE METABOLIC PANEL  URINALYSIS, ROUTINE W REFLEX MICROSCOPIC    EKG None  Radiology No results found.  Procedures Procedures   Medications Ordered in ED Medications  0.9 %  sodium chloride infusion (has no administration in time range)    ED Course  I have reviewed the triage vital signs and the nursing notes.  Pertinent labs & imaging results that were available during my care of the patient were reviewed by me and considered in my medical decision making (see chart for details).    MDM Rules/Calculators/A&P                          Had a long discussion with patient and his wife.  He has a history of neuropathy at baseline and does use a walker.  Wife states that patient has severe pain last night and took 2 doses of his opiate medication along with 2 doses of his benzo.  He then became confused after that.  His confusion is actually improved.  She is concerned about an area of infection on his left lower extremity which is consistent with cellulitis.  Urinalysis is pending at this time.  Patient's head CT is within normal limits.  His neurological exam shows good strength in upper as well as lower extremity.  Suspect his gait issues are chronic in nature.  Low suspicion this represents CVA.  Mild elevation in creatinine  noted and chest x-ray without infiltrates.  He denies any shortness of breath at this time.  Pulse oximetry is stable.  Patient's wife states that she feels comfortable taking him home.  Awaiting results of urinalysis.  Will sign off next provider   Final Clinical Impression(s) / ED Diagnoses Final diagnoses:  None    Rx / DC Orders ED Discharge Orders    None       Lacretia Leigh, MD 12/15/20 1502

## 2020-12-15 NOTE — ED Provider Notes (Signed)
Patient signed out to me at 3 PM.  Here for altered mental status.  Thought to be from polypharmacy as patient took extra doses of his nightly opiate and benzodiazepines last night for worsening foot pain.  Has bad neuropathy in his feet.  Ambulates with a walker at baseline.  Neurological exam is nonfocal.  His work-up has been overall unremarkable.  Normal head CT.  No infectious findings on the chest x-ray.  No real significant anemia or electrolyte abnormality.  Creatinine slightly above baseline.  Getting small fluid bolus.  Patient has chronic gait issues from his neuropathy.  He has may be a mild cellulitis in his left lower leg from a wound that he suffered several weeks ago from a fall.  Patient and I had extensive discussion about his functional status.  He has a walker that can converted to a wheelchair.  He was able to get up and ambulate with myself.  He felt good enough that he wanted to go home.  Did offer him possible admission for hydration, medication review, physical therapy.  However him and his wife feel confident that he will be able to do well with his walker at home.  He does do some outpatient physical therapy/personal training.  He does not want to be wheelchair-bound fully and he is very motivated to get stronger with using his walker.  Overall waiting for his urine study but anticipate discharged home with Keflex prescription for cellulitis and close follow-up with primary care doctor.  Urinalysis negative for infection.  Discharged in ED in good condition.  Understands return precautions.  This chart was dictated using voice recognition software.  Despite best efforts to proofread,  errors can occur which can change the documentation meaning.     Lennice Sites, DO 12/15/20 1626

## 2020-12-15 NOTE — ED Triage Notes (Addendum)
Per EMS, patient from PCP, weakness to bilateral legs and confusion last night. Baseline today. Takes 5mg  ativan and 15 oxy at night. A&Ox4. Labs at PCP showed hyperglycemia, elevated kidney function.   20g L AC 49ml NS with EMS

## 2020-12-15 NOTE — ED Notes (Signed)
Urinal at bedside. Patient aware urine sample is needed.

## 2020-12-15 NOTE — Discharge Instructions (Addendum)
Take antibiotic as prescribed.  Make sure that you are always using your walker and consider using the wheelchair version of your walker on the days that you feel more unsteady.  Please continue to work with your primary care doctor about getting more intensive physical therapy.  Please be careful when taking your Valium and opioids as these are going to make you more likely to have falls.

## 2020-12-17 LAB — URINE CULTURE: Culture: <10000 — AB

## 2020-12-20 DIAGNOSIS — R296 Repeated falls: Secondary | ICD-10-CM | POA: Diagnosis not present

## 2020-12-20 DIAGNOSIS — G629 Polyneuropathy, unspecified: Secondary | ICD-10-CM | POA: Diagnosis not present

## 2020-12-25 DIAGNOSIS — I4891 Unspecified atrial fibrillation: Secondary | ICD-10-CM | POA: Diagnosis not present

## 2020-12-25 DIAGNOSIS — E1165 Type 2 diabetes mellitus with hyperglycemia: Secondary | ICD-10-CM | POA: Diagnosis not present

## 2020-12-25 DIAGNOSIS — E7800 Pure hypercholesterolemia, unspecified: Secondary | ICD-10-CM | POA: Diagnosis not present

## 2020-12-25 DIAGNOSIS — I10 Essential (primary) hypertension: Secondary | ICD-10-CM | POA: Diagnosis not present

## 2020-12-25 DIAGNOSIS — I25119 Atherosclerotic heart disease of native coronary artery with unspecified angina pectoris: Secondary | ICD-10-CM | POA: Diagnosis not present

## 2020-12-25 DIAGNOSIS — K219 Gastro-esophageal reflux disease without esophagitis: Secondary | ICD-10-CM | POA: Diagnosis not present

## 2020-12-25 DIAGNOSIS — E119 Type 2 diabetes mellitus without complications: Secondary | ICD-10-CM | POA: Diagnosis not present

## 2020-12-25 DIAGNOSIS — E1140 Type 2 diabetes mellitus with diabetic neuropathy, unspecified: Secondary | ICD-10-CM | POA: Diagnosis not present

## 2020-12-25 DIAGNOSIS — I480 Paroxysmal atrial fibrillation: Secondary | ICD-10-CM | POA: Diagnosis not present

## 2020-12-25 DIAGNOSIS — I5042 Chronic combined systolic (congestive) and diastolic (congestive) heart failure: Secondary | ICD-10-CM | POA: Diagnosis not present

## 2020-12-25 DIAGNOSIS — I252 Old myocardial infarction: Secondary | ICD-10-CM | POA: Diagnosis not present

## 2021-01-05 DIAGNOSIS — E1140 Type 2 diabetes mellitus with diabetic neuropathy, unspecified: Secondary | ICD-10-CM | POA: Diagnosis not present

## 2021-01-05 DIAGNOSIS — G629 Polyneuropathy, unspecified: Secondary | ICD-10-CM | POA: Diagnosis not present

## 2021-01-06 ENCOUNTER — Other Ambulatory Visit: Payer: Self-pay | Admitting: Interventional Cardiology

## 2021-01-15 ENCOUNTER — Ambulatory Visit: Payer: Medicare Other | Admitting: Orthopedic Surgery

## 2021-01-15 ENCOUNTER — Encounter: Payer: Self-pay | Admitting: Orthopedic Surgery

## 2021-01-15 DIAGNOSIS — L97929 Non-pressure chronic ulcer of unspecified part of left lower leg with unspecified severity: Secondary | ICD-10-CM

## 2021-01-15 DIAGNOSIS — I87332 Chronic venous hypertension (idiopathic) with ulcer and inflammation of left lower extremity: Secondary | ICD-10-CM | POA: Diagnosis not present

## 2021-01-15 NOTE — Progress Notes (Signed)
Office Visit Note   Patient: [name]           Date of Birth: 1941-08-27           MRN: [id number] Visit Date: 01/15/2021              Requested by: [name], MD [address] [place],  [place] [address] PCP: [name], MD  Chief Complaint  Patient presents with  . Left Leg - Pain, Open Wound      HPI: Patient is an 80 year old gentleman who is seen for evaluation for traumatic venous ulcer left leg from striking a coffee table.  Patient went to the emergency department he was prescribed an antibiotic he has been using hydrogen peroxide.  Patient complains of neuropathic pain in both lower extremities he states that his hemoglobin A1c used to run less than 7 and is now above 7.  He states he does have a follow-up with a pharmacist to evaluate for possible insulin use.  Assessment & Plan: Visit Diagnoses:  1. Chronic venous hypertension (idiopathic) with ulcer and inflammation of left lower extremity (HCC)     Plan: Patient is placed in a medium compression stocking on the left he has size large compression stockings at home he will wear the sock around-the-clock directly against the wound on the left leg and follow-up in 1 week.  Follow-Up Instructions: Return in about 1 week (around 01/22/2021).   Ortho Exam  Patient is alert, oriented, no adenopathy, well-dressed, normal affect, normal respiratory effort. Examination patient has venous insufficiency in the left lower extremity greater than the right.  His left calf is 36 cm in circumference right calf is 33 cm in circumference.  Patient has a palpable pulse bilaterally of dorsalis pedis and has a strong biphasic dorsalis pedis pulse on the right he has a weaker dorsalis pedis pulse biphasic on the left and does have atrial fibrillation.  The ulcer has 75% healthy granulation tissue with 25% fibrinous exudative tissue there is no exposed bone or tendon the ulcer is approximately 1 cm in diameter and 5 cm in  length.  0.1 mm deep.  Imaging: No results found. No images are attached to the encounter.  Labs: Lab Results  Component Value Date   HGBA1C 7.8 (H) 08/07/2020   HGBA1C 8.4 (H) 02/09/2020   HGBA1C 7.3 (H) 07/04/2019   REPTSTATUS 12/17/2020 FINAL 12/15/2020   CULT (A) 12/15/2020    <10,000 COLONIES/mL INSIGNIFICANT GROWTH Performed at [facility], [place] [address], [place], [place] [address]      Lab Results  Component Value Date   ALBUMIN 3.9 12/15/2020   ALBUMIN 3.2 (L) 08/07/2020   ALBUMIN 3.1 (L) 08/06/2020   PREALBUMIN 8.4 (L) 04/12/2008    Lab Results  Component Value Date   MG 1.8 02/17/2020   MG 1.7 02/12/2020   MG 1.8 02/11/2020   No results found for: [facility]  Lab Results  Component Value Date   PREALBUMIN 8.4 (L) 04/12/2008   CBC EXTENDED Latest Ref Rng & Units 12/15/2020 08/08/2020 08/07/2020  WBC 4.0 - 10.5 K/uL 8.6 5.0 4.4  RBC 4.22 - 5.81 MIL/uL 3.77(L) 3.73(L) 3.61(L)  HGB 13.0 - 17.0 g/dL 11.9(L) 11.7(L) 11.3(L)  HCT 39.0 - 52.0 % 35.2(L) 35.2(L) 34.6(L)  PLT 150 - 400 K/uL 160 201 193  NEUTROABS 1.7 - 7.7 K/uL 5.0 - -  LYMPHSABS 0.7 - 4.0 K/uL 1.2 - -     There is no  height or weight on file to calculate BMI.  Orders:  No orders of the defined types were placed in this encounter.  No orders of the defined types were placed in this encounter.    Procedures: No procedures performed  Clinical Data: No additional findings.  ROS:  All other systems negative, except as noted in the HPI. Review of Systems  Objective: Vital Signs: There were no vitals taken for this visit.  Specialty Comments:  No specialty comments available.  PMFS History: Patient Active Problem List   Diagnosis Date Noted  . Cellulitis of toe of right foot   . Diabetic foot infection ([place]) 08/06/2020  . Osteomyelitis ([place]) 08/06/2020  . Acquired thrombophilia ([place]) 08/06/2020  . Ileus ([place]) 02/13/2020  . Acute colitis 02/08/2020  . Hypokalemia  07/08/2019  . Acute metabolic encephalopathy   . Orthostatic dizziness   . Postural dizziness with near syncope 07/04/2019  . Abdominal distention, non-gaseous 07/04/2019  . Change in mental status 07/04/2019  . Small bowel obstruction ([place]) 07/04/2019  . Postural dizziness with presyncope 05/09/2018  . Hypotension 05/08/2018  . On amiodarone therapy 08/26/2017  . Near syncope 06/11/2017  . Hypoxia   . Pneumonia of left lower lobe due to infectious organism   . Gastroenteritis and colitis, viral 04/27/2016  . Orthostasis 04/26/2016  . AKI (acute kidney injury) ([place]) 04/26/2016  . Lumbar radiculopathy, chronic 01/16/2016  . Hematoma of left kidney 11/01/2015  . Hyperlipidemia 12/12/2014  . SIRS (systemic inflammatory response syndrome) ([place]) 12/12/2014  . Influenza due to identified novel influenza A virus with other respiratory manifestations 11/07/2013  . Chronic anticoagulation 09/08/2013    Class: Chronic  . Coronary artery disease involving native coronary artery of native heart with angina pectoris ([place]) 08/16/2013    Class: Chronic  . Chronic combined systolic and diastolic heart failure (HCC) 08/16/2013    Class: Chronic  . Paroxysmal atrial fibrillation with RVR ([place]) 08/13/2013  . Paralytic ileus ([place]) 05/07/2012  . Nausea and vomiting 01/01/2012  . DM (diabetes mellitus), type 2, uncontrolled w/neurologic complication ([place]) 35/70/1779  . Hypertension   . AAA (abdominal aortic aneurysm) ([place])   . Acid reflux   . Sleep apnea    Past Medical History:  Diagnosis Date  . AAA (abdominal aortic aneurysm) ([place])    a. s/p repair 2009.  [name] Acid reflux    takes Prilosec and Protonix daily  . Arthritis    BacK  . CAD in native artery    a. a. prior inf MI 1993. b. PCI to LAD 05/1997. c. recurrent inferolateral MI complicated by V. fib arrest in 04/1998, prior PCI to OM1. d. 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  [name] Chronic combined systolic and diastolic CHF (congestive heart  failure) ([place])   . Complication of anesthesia    -years ago hair fell out.;ileus after 2 of his surgeries  . Dry skin   . Foot drop, bilateral   . Hypercholesteremia   . Hypertension   . Ileus ([place])    After AAA  . Incisional hernia    "small, from AAA"  . Ischemic cardiomyopathy   . MI (myocardial infarction) ([place])   . 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 OSA on CPAP    uses CPAP--sleep study done at least 14yrs ago  . PAF (paroxysmal atrial fibrillation) ([place])   . Pain    back pain chronic- seen at pain clinic  . Plantar fasciitis    bilatetral  .  Thrombocytopenia ([place])   . Type II diabetes mellitus (HCC)     Family History  Problem Relation Age of Onset  . Diabetes Mother   . Coronary artery disease Mother   . Diabetes Father   . Arthritis Father   . Rheum arthritis Father   . Coronary artery disease Father     Past Surgical History:  Procedure Laterality Date  . ABDOMINAL AORTIC ANEURYSM REPAIR    . BACK SURGERY  2012-2013 X 3   Miminal Invasive x 3in [place].  [name] CARDIAC CATHETERIZATION  2009/2012  . CARDIOVERSION N/A 04/12/2015   Procedure: CARDIOVERSION;  Surgeon: Sueanne Margarita, MD;  Location: [facility];  Service: Cardiovascular;  Laterality: N/A;  . CARDIOVERSION N/A 12/26/2016   Procedure: CARDIOVERSION;  Surgeon: Sueanne Margarita, MD;  [facility];  Service: Cardiovascular;  Laterality: N/A;  . COLONOSCOPY    . CORONARY ANGIOPLASTY WITH STENT PLACEMENT  1992; 1997  . LAPAROSCOPIC CHOLECYSTECTOMY    . LUMBAR LAMINECTOMY/DECOMPRESSION MICRODISCECTOMY Right 01/16/2016   Procedure: Right Lumbar five-Sacral one Laminectomy;  Surgeon: Kristeen Miss, MD;  Location: [facility]UR[facility]S;  Service: Neurosurgery;  Laterality: Right;  Right L5-S1 Laminectomy   Social History   Occupational History  . Not on file  Tobacco Use  . Smoking status: Former Smoker    Packs/day: 1.00    Years: 33.00    Pack years: 33.00    Types: Cigarettes    Quit date: 01/01/1992     Years since quitting: 29.0  . Smokeless tobacco: Never Used  Vaping Use  . Vaping Use: Never used  Substance and Sexual Activity  . Alcohol use: Yes    Comment: 6/30;2017 "GLASS OF WINE Q COUPLE MONTHS, IF THAT"  . Drug use: No  . Sexual activity: Yes

## 2021-01-19 DIAGNOSIS — I5042 Chronic combined systolic (congestive) and diastolic (congestive) heart failure: Secondary | ICD-10-CM | POA: Diagnosis not present

## 2021-01-19 DIAGNOSIS — I10 Essential (primary) hypertension: Secondary | ICD-10-CM | POA: Diagnosis not present

## 2021-01-19 DIAGNOSIS — I25119 Atherosclerotic heart disease of native coronary artery with unspecified angina pectoris: Secondary | ICD-10-CM | POA: Diagnosis not present

## 2021-01-19 DIAGNOSIS — E1165 Type 2 diabetes mellitus with hyperglycemia: Secondary | ICD-10-CM | POA: Diagnosis not present

## 2021-01-19 DIAGNOSIS — I252 Old myocardial infarction: Secondary | ICD-10-CM | POA: Diagnosis not present

## 2021-01-19 DIAGNOSIS — E1140 Type 2 diabetes mellitus with diabetic neuropathy, unspecified: Secondary | ICD-10-CM | POA: Diagnosis not present

## 2021-01-19 DIAGNOSIS — E7800 Pure hypercholesterolemia, unspecified: Secondary | ICD-10-CM | POA: Diagnosis not present

## 2021-01-19 DIAGNOSIS — K219 Gastro-esophageal reflux disease without esophagitis: Secondary | ICD-10-CM | POA: Diagnosis not present

## 2021-01-19 DIAGNOSIS — I480 Paroxysmal atrial fibrillation: Secondary | ICD-10-CM | POA: Diagnosis not present

## 2021-01-22 ENCOUNTER — Ambulatory Visit (INDEPENDENT_AMBULATORY_CARE_PROVIDER_SITE_OTHER): Payer: Medicare Other | Admitting: Physician Assistant

## 2021-01-22 ENCOUNTER — Encounter: Payer: Self-pay | Admitting: Orthopedic Surgery

## 2021-01-22 DIAGNOSIS — L03119 Cellulitis of unspecified part of limb: Secondary | ICD-10-CM | POA: Diagnosis not present

## 2021-01-22 DIAGNOSIS — E11628 Type 2 diabetes mellitus with other skin complications: Secondary | ICD-10-CM

## 2021-01-22 NOTE — Progress Notes (Signed)
Office Visit Note   Patient: [name]           Date of Birth: 1941-02-26           MRN: [id number] Visit Date: 01/22/2021              Requested by: [name], MD [address] [place],  [place] [address] PCP: [name], MD  Chief Complaint  Patient presents with  . Left Leg - Follow-up      HPI: Patient presents for his left lower extremity ulcer.  He has been wearing his compression socks and bathing his legs daily with Dial soap  Assessment & Plan: Visit Diagnoses: No diagnosis found.  Plan: Continue with compression socks follow-up in 2 weeks.  Follow-Up Instructions: No follow-ups on file.   Ortho Exam  Patient is alert, oriented, no adenopathy, well-dressed, normal affect, normal respiratory effort. Examination left lower extremity ulcer measures 5.5 cm in length and 1.34 cm in width at its widest part it is 100% healthy granulation tissue.  No surrounding cellulitis.  No ascending cellulitis.  No foul odor no signs of infection  Imaging: No results found. No images are attached to the encounter.  Labs: Lab Results  Component Value Date   HGBA1C 7.8 (H) 08/07/2020   HGBA1C 8.4 (H) 02/09/2020   HGBA1C 7.3 (H) 07/04/2019   REPTSTATUS 12/17/2020 FINAL 12/15/2020   CULT (A) 12/15/2020    <10,000 COLONIES/mL INSIGNIFICANT GROWTH Performed at [facility], [place] [address] [place], [place] [address]      Lab Results  Component Value Date   ALBUMIN 3.9 12/15/2020   ALBUMIN 3.2 (L) 08/07/2020   ALBUMIN 3.1 (L) 08/06/2020   PREALBUMIN 8.4 (L) 04/12/2008    Lab Results  Component Value Date   MG 1.8 02/17/2020   MG 1.7 02/12/2020   MG 1.8 02/11/2020   No results found for: [facility]  Lab Results  Component Value Date   PREALBUMIN 8.4 (L) 04/12/2008   CBC EXTENDED Latest Ref Rng & Units 12/15/2020 08/08/2020 08/07/2020  WBC 4.0 - 10.5 K/uL 8.6 5.0 4.4  RBC 4.22 - 5.81 MIL/uL 3.77(L) 3.73(L) 3.61(L)  HGB 13.0 - 17.0 g/dL  11.9(L) 11.7(L) 11.3(L)  HCT 39.0 - 52.0 % 35.2(L) 35.2(L) 34.6(L)  PLT 150 - 400 K/uL 160 201 193  NEUTROABS 1.7 - 7.7 K/uL 5.0 - -  LYMPHSABS 0.7 - 4.0 K/uL 1.2 - -     There is no height or weight on file to calculate BMI.  Orders:  No orders of the defined types were placed in this encounter.  No orders of the defined types were placed in this encounter.    Procedures: No procedures performed  Clinical Data: No additional findings.  ROS:  All other systems negative, except as noted in the HPI. Review of Systems  Objective: Vital Signs: There were no vitals taken for this visit.  Specialty Comments:  No specialty comments available.  PMFS History: Patient Active Problem List   Diagnosis Date Noted  . Cellulitis of toe of right foot   . Diabetic foot infection ([place]) 08/06/2020  . Osteomyelitis ([place]) 08/06/2020  . Acquired thrombophilia ([place]) 08/06/2020  . Ileus ([place]) 02/13/2020  . Acute colitis 02/08/2020  . Hypokalemia 07/08/2019  . Acute metabolic encephalopathy   . Orthostatic dizziness   . Postural dizziness with near syncope 07/04/2019  . Abdominal distention, non-gaseous 07/04/2019  . Change in mental status 07/04/2019  . Small bowel  obstruction ([place]) 07/04/2019  . Postural dizziness with presyncope 05/09/2018  . Hypotension 05/08/2018  . On amiodarone therapy 08/26/2017  . Near syncope 06/11/2017  . Hypoxia   . Pneumonia of left lower lobe due to infectious organism   . Gastroenteritis and colitis, viral 04/27/2016  . Orthostasis 04/26/2016  . AKI (acute kidney injury) ([place]) 04/26/2016  . Lumbar radiculopathy, chronic 01/16/2016  . Hematoma of left kidney 11/01/2015  . Hyperlipidemia 12/12/2014  . SIRS (systemic inflammatory response syndrome) ([place]) 12/12/2014  . Influenza due to identified novel influenza A virus with other respiratory manifestations 11/07/2013  . Chronic anticoagulation 09/08/2013    Class: Chronic  . Coronary artery disease  involving native coronary artery of native heart with angina pectoris ([place]) 08/16/2013    Class: Chronic  . Chronic combined systolic and diastolic heart failure (HCC) 08/16/2013    Class: Chronic  . Paroxysmal atrial fibrillation with RVR ([place]) 08/13/2013  . Paralytic ileus ([place]) 05/07/2012  . Nausea and vomiting 01/01/2012  . DM (diabetes mellitus), type 2, uncontrolled w/neurologic complication ([place]) 71/69/6789  . Hypertension   . AAA (abdominal aortic aneurysm) ([place])   . Acid reflux   . Sleep apnea    Past Medical History:  Diagnosis Date  . AAA (abdominal aortic aneurysm) ([place])    a. s/p repair 2009.  [name] Acid reflux    takes Prilosec and Protonix daily  . Arthritis    BacK  . CAD in native artery    a. a. prior inf MI 1993. b. PCI to LAD 05/1997. c. recurrent inferolateral MI complicated by V. fib arrest in 04/1998, prior PCI to OM1. d. 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  [name] Chronic combined systolic and diastolic CHF (congestive heart failure) ([place])   . Complication of anesthesia    -years ago hair fell out.;ileus after 2 of his surgeries  . Dry skin   . Foot drop, bilateral   . Hypercholesteremia   . Hypertension   . Ileus ([place])    After AAA  . Incisional hernia    "small, from AAA"  . Ischemic cardiomyopathy   . MI (myocardial infarction) ([place])   . 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 OSA on CPAP    uses CPAP--sleep study done at least 105yrs ago  . PAF (paroxysmal atrial fibrillation) ([place])   . Pain    back pain chronic- seen at pain clinic  . Plantar fasciitis    bilatetral  . Thrombocytopenia ([place])   . Type II diabetes mellitus (HCC)     Family History  Problem Relation Age of Onset  . Diabetes Mother   . Coronary artery disease Mother   . Diabetes Father   . Arthritis Father   . Rheum arthritis Father   . Coronary artery disease Father     Past Surgical History:  Procedure Laterality Date  . ABDOMINAL AORTIC ANEURYSM REPAIR    . BACK SURGERY  2012-2013 X 3    Miminal Invasive x 3in [place].  [name] CARDIAC CATHETERIZATION  2009/2012  . CARDIOVERSION N/A 04/12/2015   Procedure: CARDIOVERSION;  Surgeon: Sueanne Margarita, MD;  Location: [facility];  Service: Cardiovascular;  Laterality: N/A;  . CARDIOVERSION N/A 12/26/2016   Procedure: CARDIOVERSION;  Surgeon: Sueanne Margarita, MD;  [facility];  Service: Cardiovascular;  Laterality: N/A;  . COLONOSCOPY    . CORONARY ANGIOPLASTY WITH STENT PLACEMENT  1992; 1997  . LAPAROSCOPIC CHOLECYSTECTOMY    . LUMBAR LAMINECTOMY/DECOMPRESSION MICRODISCECTOMY  Right 01/16/2016   Procedure: Right Lumbar five-Sacral one Laminectomy;  Surgeon: Kristeen Miss, MD;  Location: [facility];  Service: Neurosurgery;  Laterality: Right;  Right L5-S1 Laminectomy   Social History   Occupational History  . Not on file  Tobacco Use  . Smoking status: Former Smoker    Packs/day: 1.00    Years: 33.00    Pack years: 33.00    Types: Cigarettes    Quit date: 01/01/1992    Years since quitting: 29.0  . Smokeless tobacco: Never Used  Vaping Use  . Vaping Use: Never used  Substance and Sexual Activity  . Alcohol use: Yes    Comment: 6/30;2017 "GLASS OF WINE Q COUPLE MONTHS, IF THAT"  . Drug use: No  . Sexual activity: Yes

## 2021-01-29 DIAGNOSIS — G4733 Obstructive sleep apnea (adult) (pediatric): Secondary | ICD-10-CM | POA: Diagnosis not present

## 2021-02-02 DIAGNOSIS — E119 Type 2 diabetes mellitus without complications: Secondary | ICD-10-CM | POA: Diagnosis not present

## 2021-02-02 DIAGNOSIS — H2513 Age-related nuclear cataract, bilateral: Secondary | ICD-10-CM | POA: Diagnosis not present

## 2021-02-02 DIAGNOSIS — H25043 Posterior subcapsular polar age-related cataract, bilateral: Secondary | ICD-10-CM | POA: Diagnosis not present

## 2021-02-02 DIAGNOSIS — H25013 Cortical age-related cataract, bilateral: Secondary | ICD-10-CM | POA: Diagnosis not present

## 2021-02-02 DIAGNOSIS — H2511 Age-related nuclear cataract, right eye: Secondary | ICD-10-CM | POA: Diagnosis not present

## 2021-02-05 ENCOUNTER — Ambulatory Visit: Payer: Medicare Other | Admitting: Physician Assistant

## 2021-02-05 ENCOUNTER — Encounter: Payer: Self-pay | Admitting: Orthopedic Surgery

## 2021-02-05 DIAGNOSIS — L03119 Cellulitis of unspecified part of limb: Secondary | ICD-10-CM | POA: Diagnosis not present

## 2021-02-05 DIAGNOSIS — E11628 Type 2 diabetes mellitus with other skin complications: Secondary | ICD-10-CM | POA: Diagnosis not present

## 2021-02-05 MED ORDER — SULFAMETHOXAZOLE-TRIMETHOPRIM 800-160 MG PO TABS: 1.0000 | ORAL_TABLET | Freq: Two times a day (BID) | ORAL | 0 refills | Status: DC

## 2021-02-05 NOTE — Progress Notes (Signed)
Office Visit Note   Patient: [name]           Date of Birth: August 02, 1941           MRN: [id number] Visit Date: 02/05/2021              Requested by: [name], MD [address] [place],  [place] [address] PCP: [name], MD  Chief Complaint  Patient presents with  . Left Leg - Follow-up      HPI: Follow-up today for his ulcer on his left leg.  He does think it is a bit more red than it has been in the past.  He has been wearing his compression sock.  He denies any fever or chills.  Assessment & Plan: Visit Diagnoses: No diagnosis found.  Plan: We will call him in some Bactrim.  Explained the importance to he and his wife is also obtaining a probiotic which they will do.  We will follow-up in 2 weeks or sooner if any concerns  Follow-Up Instructions: No follow-ups on file.   Ortho Exam  Patient is alert, oriented, no adenopathy, well-dressed, normal affect, normal respiratory effort. Left leg anterior ulcer is at skin level there is no foul odor no drainage he has erythema surrounding this but this does not ascend.  There is no fluctuance.  Compartments are soft.  Swelling is overall well controlled.  Ulcer measures 5 cm x 1-1/2 cm  Imaging: No results found. No images are attached to the encounter.  Labs: Lab Results  Component Value Date   HGBA1C 7.8 (H) 08/07/2020   HGBA1C 8.4 (H) 02/09/2020   HGBA1C 7.3 (H) 07/04/2019   REPTSTATUS 12/17/2020 FINAL 12/15/2020   CULT (A) 12/15/2020    <10,000 COLONIES/mL INSIGNIFICANT GROWTH Performed at [facility], [place] [address], [place], [place] [address]      Lab Results  Component Value Date   ALBUMIN 3.9 12/15/2020   ALBUMIN 3.2 (L) 08/07/2020   ALBUMIN 3.1 (L) 08/06/2020   PREALBUMIN 8.4 (L) 04/12/2008    Lab Results  Component Value Date   MG 1.8 02/17/2020   MG 1.7 02/12/2020   MG 1.8 02/11/2020   No results found for: [facility]  Lab Results  Component Value Date    PREALBUMIN 8.4 (L) 04/12/2008   CBC EXTENDED Latest Ref Rng & Units 12/15/2020 08/08/2020 08/07/2020  WBC 4.0 - 10.5 K/uL 8.6 5.0 4.4  RBC 4.22 - 5.81 MIL/uL 3.77(L) 3.73(L) 3.61(L)  HGB 13.0 - 17.0 g/dL 11.9(L) 11.7(L) 11.3(L)  HCT 39.0 - 52.0 % 35.2(L) 35.2(L) 34.6(L)  PLT 150 - 400 K/uL 160 201 193  NEUTROABS 1.7 - 7.7 K/uL 5.0 - -  LYMPHSABS 0.7 - 4.0 K/uL 1.2 - -     There is no height or weight on file to calculate BMI.  Orders:  No orders of the defined types were placed in this encounter.  Meds ordered this encounter  Medications  . sulfamethoxazole-trimethoprim (BACTRIM DS) 800-160 MG tablet    Sig: Take 1 tablet by mouth 2 (two) times daily.    Dispense:  60 tablet    Refill:  0     Procedures: No procedures performed  Clinical Data: No additional findings.  ROS:  All other systems negative, except as noted in the HPI. Review of Systems  Objective: Vital Signs: There were no vitals taken for this visit.  Specialty Comments:  No specialty comments available.  PMFS History: Patient Active Problem  List   Diagnosis Date Noted  . Cellulitis of toe of right foot   . Diabetic foot infection ([place]) 08/06/2020  . Osteomyelitis ([place]) 08/06/2020  . Acquired thrombophilia ([place]) 08/06/2020  . Ileus ([place]) 02/13/2020  . Acute colitis 02/08/2020  . Hypokalemia 07/08/2019  . Acute metabolic encephalopathy   . Orthostatic dizziness   . Postural dizziness with near syncope 07/04/2019  . Abdominal distention, non-gaseous 07/04/2019  . Change in mental status 07/04/2019  . Small bowel obstruction ([place]) 07/04/2019  . Postural dizziness with presyncope 05/09/2018  . Hypotension 05/08/2018  . On amiodarone therapy 08/26/2017  . Near syncope 06/11/2017  . Hypoxia   . Pneumonia of left lower lobe due to infectious organism   . Gastroenteritis and colitis, viral 04/27/2016  . Orthostasis 04/26/2016  . AKI (acute kidney injury) ([place]) 04/26/2016  . Lumbar radiculopathy,  chronic 01/16/2016  . Hematoma of left kidney 11/01/2015  . Hyperlipidemia 12/12/2014  . SIRS (systemic inflammatory response syndrome) ([place]) 12/12/2014  . Influenza due to identified novel influenza A virus with other respiratory manifestations 11/07/2013  . Chronic anticoagulation 09/08/2013    Class: Chronic  . Coronary artery disease involving native coronary artery of native heart with angina pectoris ([place]) 08/16/2013    Class: Chronic  . Chronic combined systolic and diastolic heart failure (HCC) 08/16/2013    Class: Chronic  . Paroxysmal atrial fibrillation with RVR ([place]) 08/13/2013  . Paralytic ileus ([place]) 05/07/2012  . Nausea and vomiting 01/01/2012  . DM (diabetes mellitus), type 2, uncontrolled w/neurologic complication ([place]) 73/41/9379  . Hypertension   . AAA (abdominal aortic aneurysm) ([place])   . Acid reflux   . Sleep apnea    Past Medical History:  Diagnosis Date  . AAA (abdominal aortic aneurysm) ([place])    a. s/p repair 2009.  [name] Acid reflux    takes Prilosec and Protonix daily  . Arthritis    BacK  . CAD in native artery    a. a. prior inf MI 1993. b. PCI to LAD 05/1997. c. recurrent inferolateral MI complicated by V. fib arrest in 04/1998, prior PCI to OM1. d. 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  [name] Chronic combined systolic and diastolic CHF (congestive heart failure) ([place])   . Complication of anesthesia    -years ago hair fell out.;ileus after 2 of his surgeries  . Dry skin   . Foot drop, bilateral   . Hypercholesteremia   . Hypertension   . Ileus ([place])    After AAA  . Incisional hernia    "small, from AAA"  . Ischemic cardiomyopathy   . MI (myocardial infarction) ([place])   . 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 OSA on CPAP    uses CPAP--sleep study done at least 22yrs ago  . PAF (paroxysmal atrial fibrillation) ([place])   . Pain    back pain chronic- seen at pain clinic  . Plantar fasciitis    bilatetral  . Thrombocytopenia ([place])   . Type II diabetes mellitus  (HCC)     Family History  Problem Relation Age of Onset  . Diabetes Mother   . Coronary artery disease Mother   . Diabetes Father   . Arthritis Father   . Rheum arthritis Father   . Coronary artery disease Father     Past Surgical History:  Procedure Laterality Date  . ABDOMINAL AORTIC ANEURYSM REPAIR    . BACK SURGERY  2012-2013 X 3   Miminal Invasive x 3in  [place].  [name] CARDIAC CATHETERIZATION  2009/2012  . CARDIOVERSION N/A 04/12/2015   Procedure: CARDIOVERSION;  Surgeon: Sueanne Margarita, MD;  Location: [facility];  Service: Cardiovascular;  Laterality: N/A;  . CARDIOVERSION N/A 12/26/2016   Procedure: CARDIOVERSION;  Surgeon: Sueanne Margarita, MD;  [facility];  Service: Cardiovascular;  Laterality: N/A;  . COLONOSCOPY    . CORONARY ANGIOPLASTY WITH STENT PLACEMENT  1992; 1997  . LAPAROSCOPIC CHOLECYSTECTOMY    . LUMBAR LAMINECTOMY/DECOMPRESSION MICRODISCECTOMY Right 01/16/2016   Procedure: Right Lumbar five-Sacral one Laminectomy;  Surgeon: Kristeen Miss, MD;  Location: [facility];  Service: Neurosurgery;  Laterality: Right;  Right L5-S1 Laminectomy   Social History   Occupational History  . Not on file  Tobacco Use  . Smoking status: Former Smoker    Packs/day: 1.00    Years: 33.00    Pack years: 33.00    Types: Cigarettes    Quit date: 01/01/1992    Years since quitting: 29.1  . Smokeless tobacco: Never Used  Vaping Use  . Vaping Use: Never used  Substance and Sexual Activity  . Alcohol use: Yes    Comment: 6/30;2017 "GLASS OF WINE Q COUPLE MONTHS, IF THAT"  . Drug use: No  . Sexual activity: Yes

## 2021-02-07 IMAGING — CT CT HEAD W/O CM
3 series · 15 of 47 positions shown, 18 images · non-contrast
Comparison: Prior CT from 06/02/2018.

CLINICAL DATA: Initial evaluation for acute ataxia, stroke
suspected.

EXAM:
CT HEAD WITHOUT CONTRAST
TECHNIQUE: Contiguous axial images were obtained from the base of the skull
through the vertex without intravenous contrast.

[Series 2: head wo · axial · 0.47mm/px · z∈[+1575,+1705]mm · 9 of 32 slices shown, 12 images]
[im 3/32  brain]
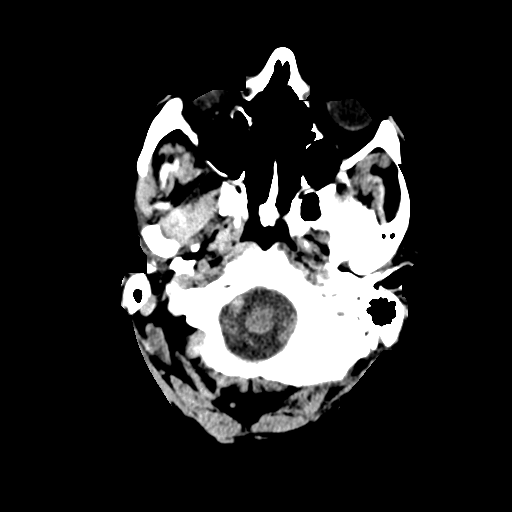
[im 3/32  bone]
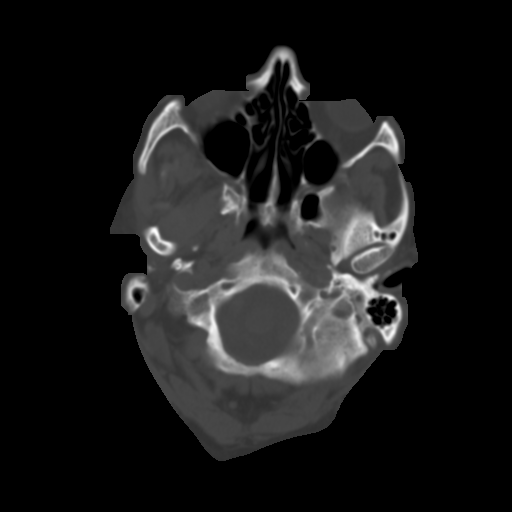
[im 6/32  brain]
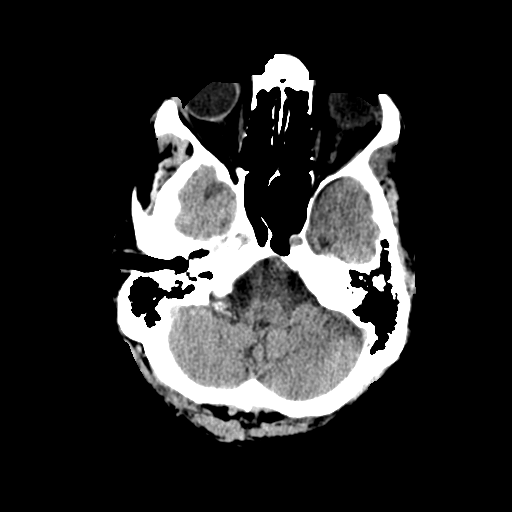
[im 9/32  brain]
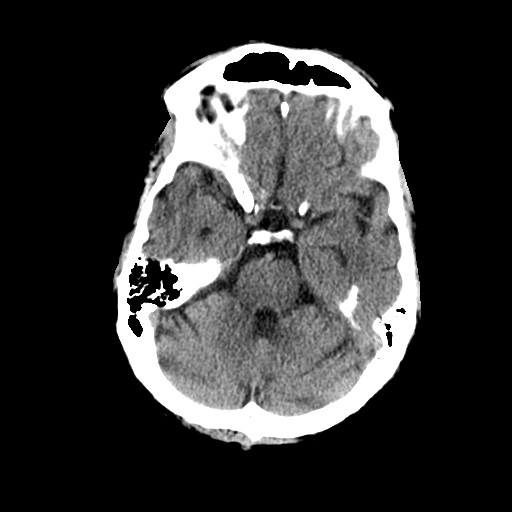
[im 12/32  brain]
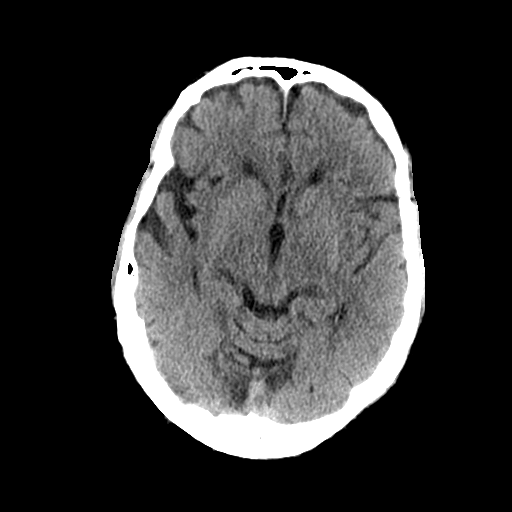
[im 17/32  brain]
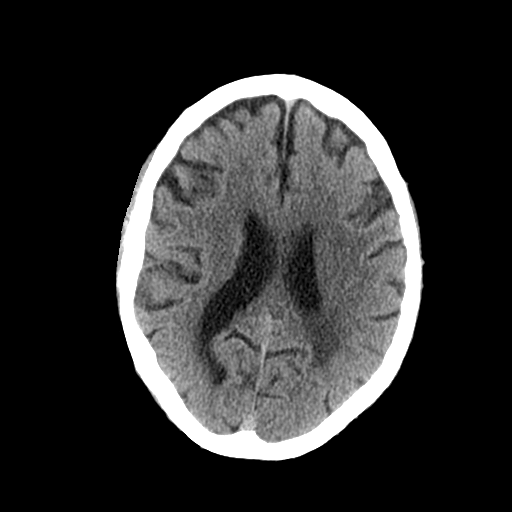
[im 17/32  bone]
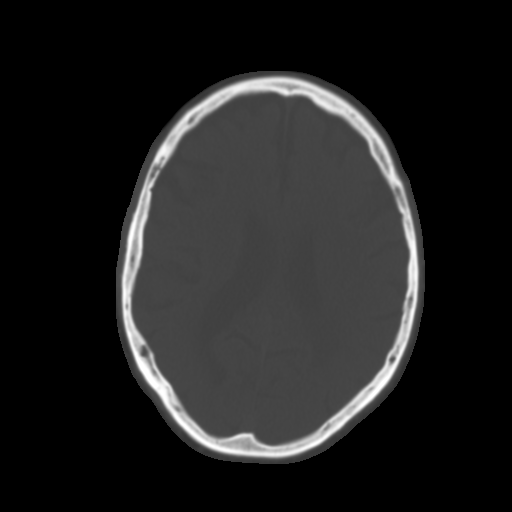
[im 20/32  brain]
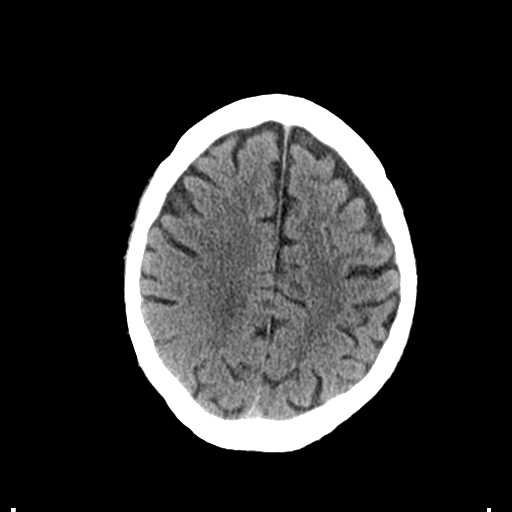
[im 23/32  brain]
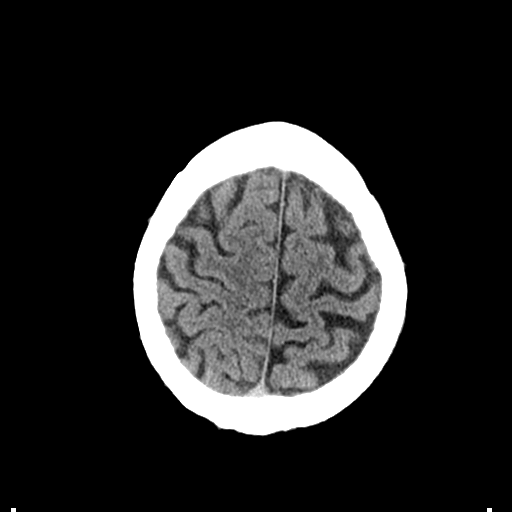
[im 26/32  brain]
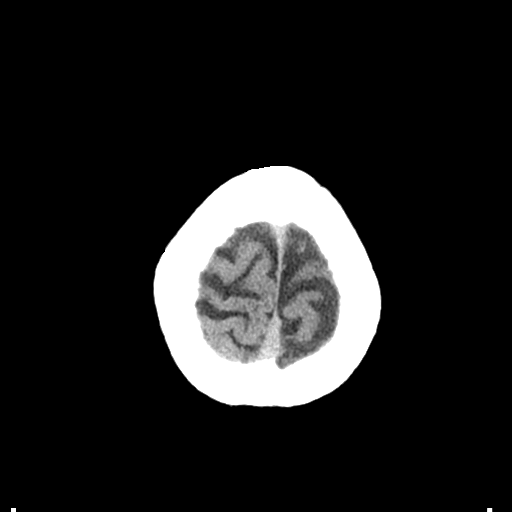
[im 29/32  brain]
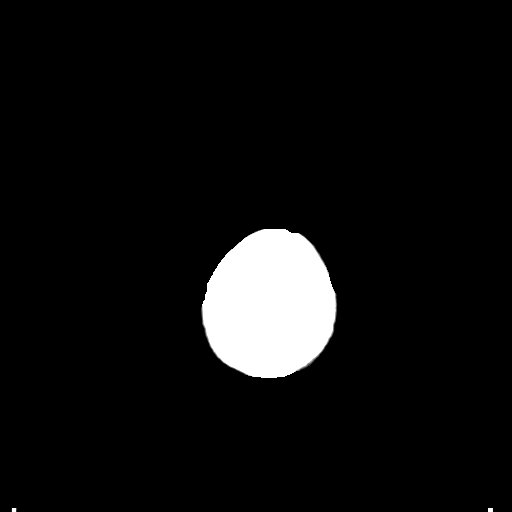
[im 29/32  bone]
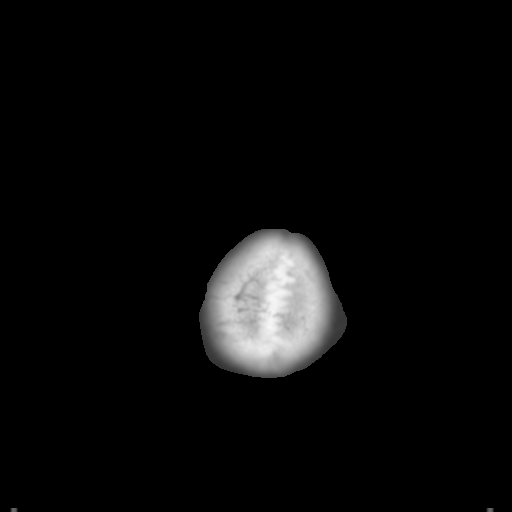

[Series 5: coronal soft tissue · coronal · 0.33mm/px · 3 of 73 slices shown]
[im 25/73  brain]
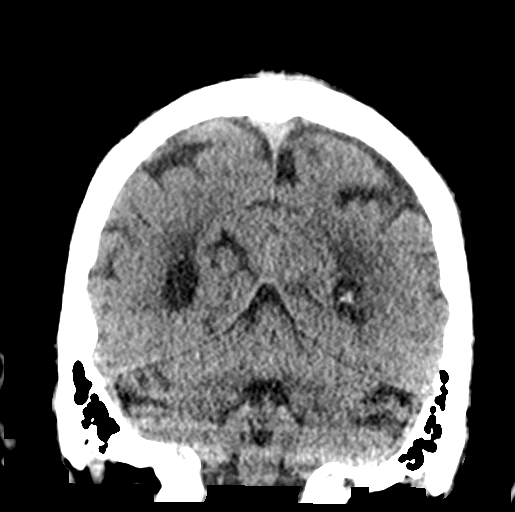
[im 33/73  brain]
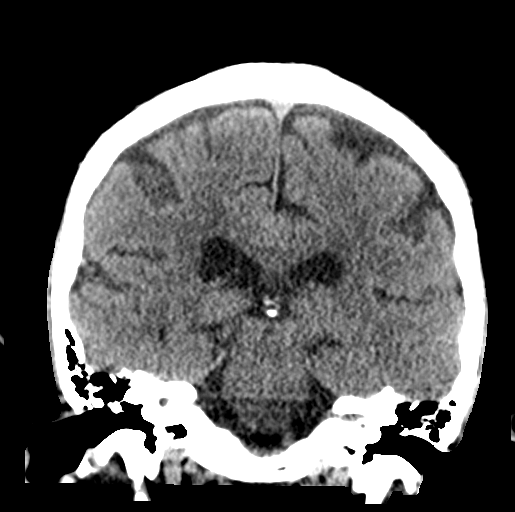
[im 41/73  brain]
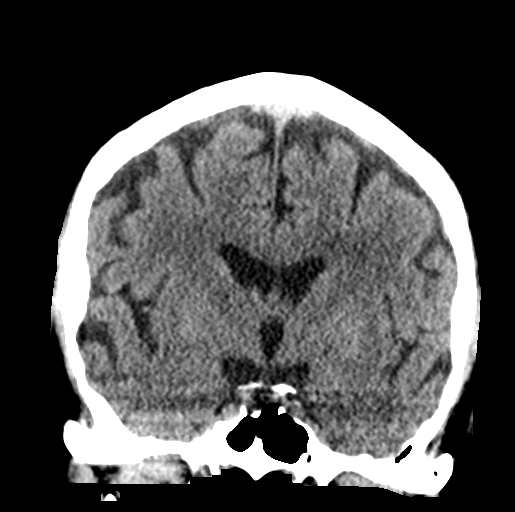

[Series 6: sagittal soft tissue · sagittal · 0.32mm/px · 3 of 61 slices shown]
[im 21/61  brain]
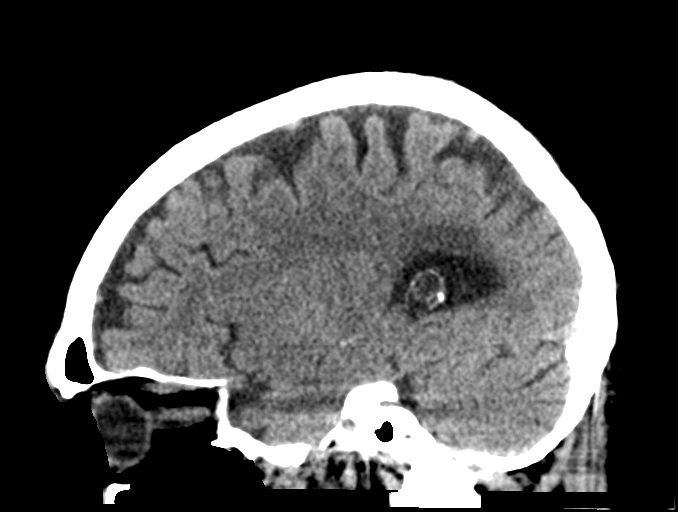
[im 31/61  brain]
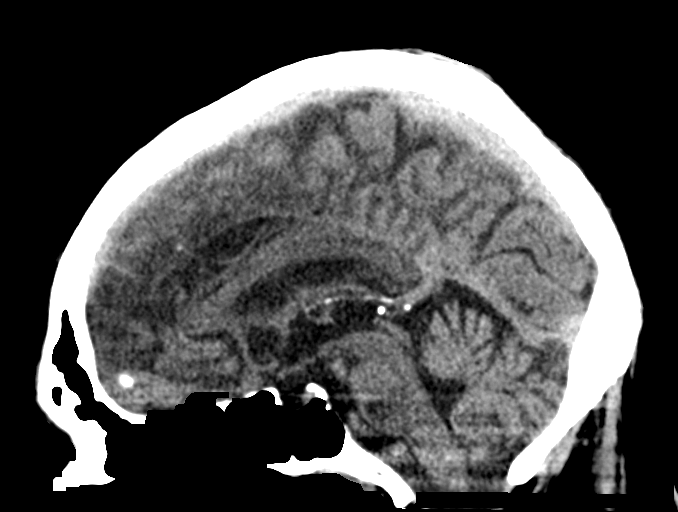
[im 41/61  brain]
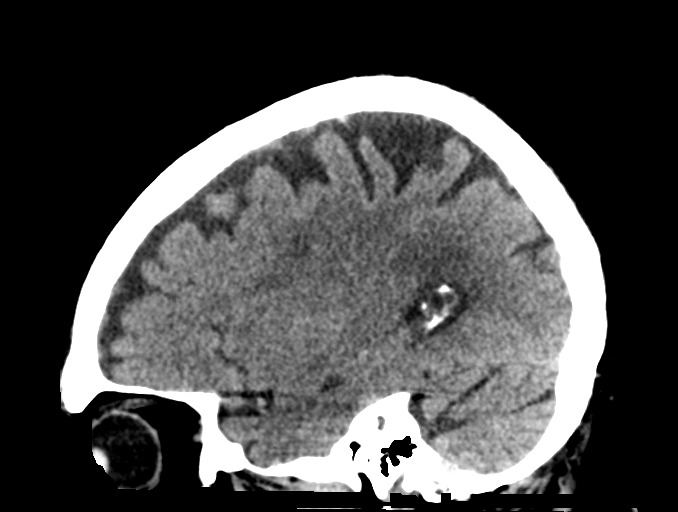

[15 of 47 positions shown; findings below may reference images not displayed]

FINDINGS: Brain: Generalized age-related cerebral atrophy with mild chronic
small vessel ischemic disease. No acute intracranial hemorrhage. No
acute large vessel territory infarct. No mass lesion, midline shift
or mass effect. No hydrocephalus. No extra-axial fluid collection.

Vascular: No hyperdense vessel. Scattered vascular calcifications
noted within the carotid siphons.

Skull: Scalp soft tissues and calvarium within normal limits.

Sinuses/Orbits: Globes and orbital soft tissues demonstrate no acute
finding. Trace layering opacity noted within the right sphenoid
sinus. Paranasal sinuses are otherwise clear. No mastoid effusion.

Other: Noted.
IMPRESSION: 1. No acute intracranial abnormality.
2. Generalized age-related cerebral atrophy with mild chronic small
vessel ischemic disease.

## 2021-02-07 IMAGING — DX DG ABDOMEN 1V
1 series · 1 of 1 positions shown · non-contrast
Comparison: Abdominal radiograph 07/04/2019 at [DATE] a.m.

CLINICAL DATA: Encounter for NG tube replacement.

EXAM:
ABDOMEN - 1 VIEW

[abdomen kub]
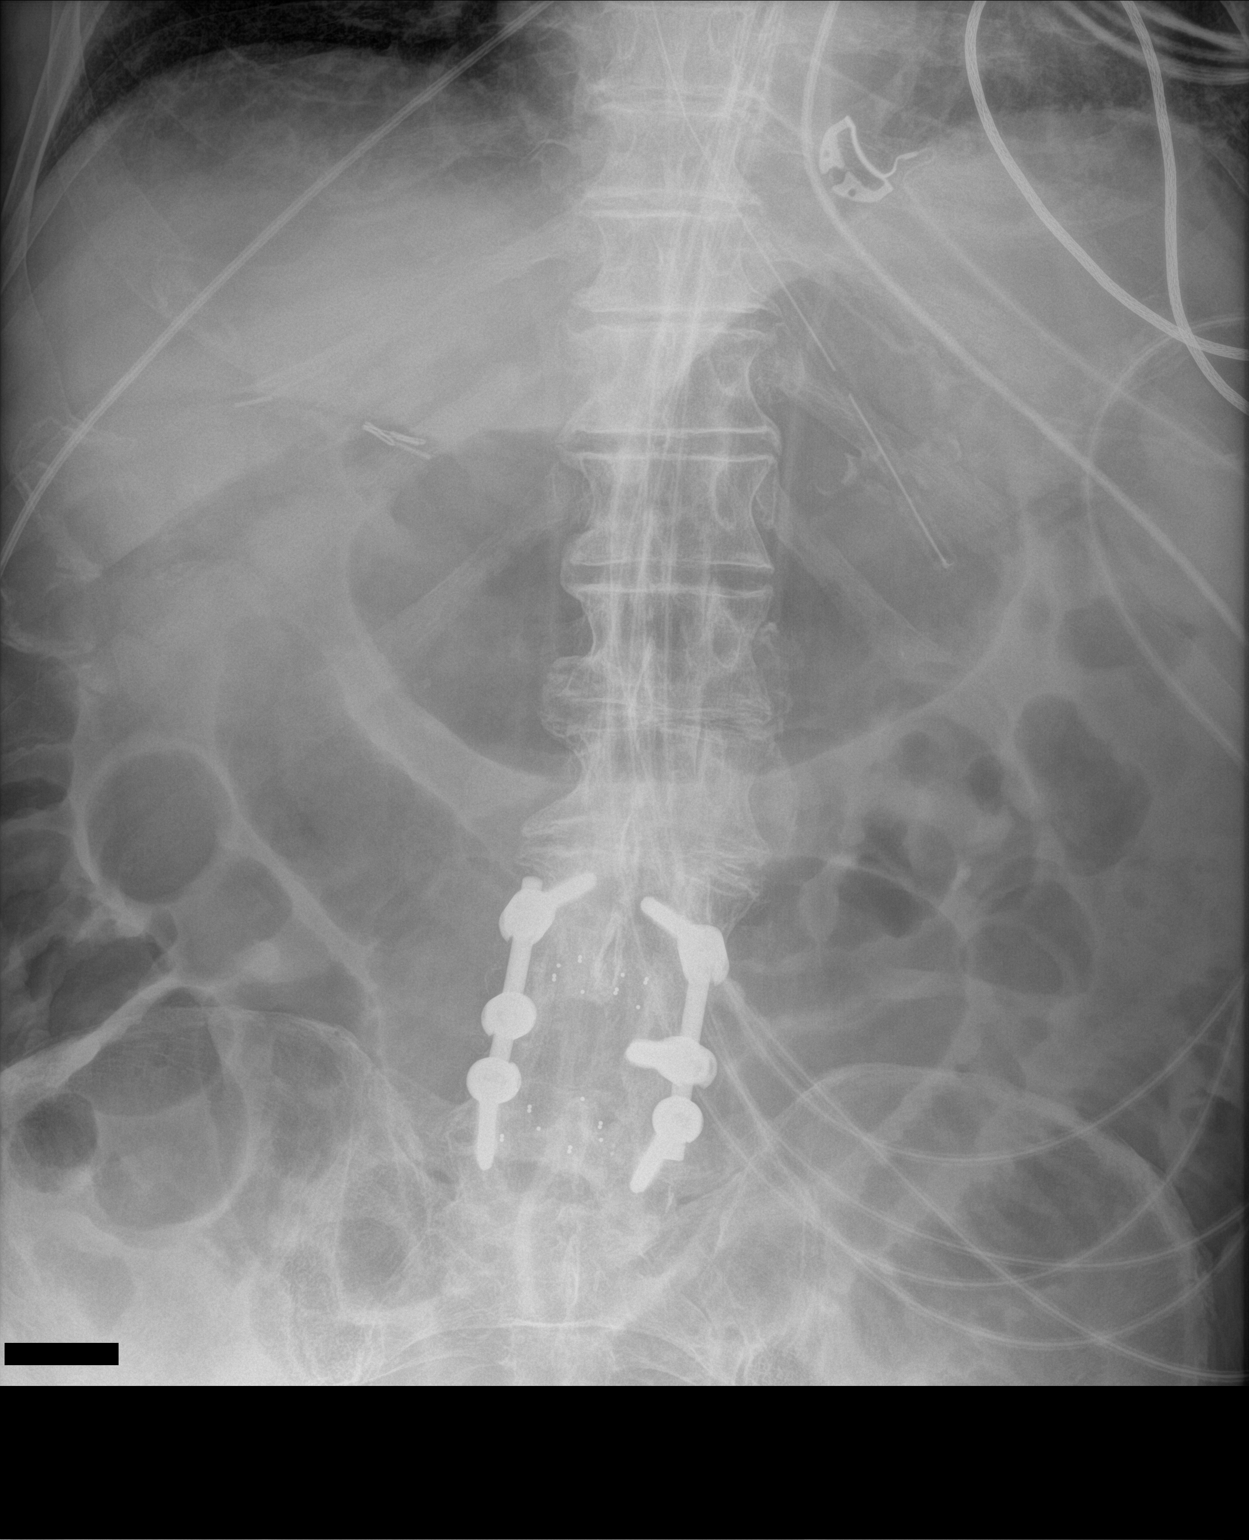

[1 of 1 positions shown; findings below may reference images not displayed]

FINDINGS: Interval placement of a nasogastric tube with side port projecting
beyond the GE junction over the stomach.

There are several dilated air-filled loops of small bowel in the mid
abdomen raising concern for obstruction. Surgical clips noted in the
right upper quadrant. Lumbar spine hardware intact.
IMPRESSION: 1. Nasogastric tube side port projects beyond the GE junction over
the stomach.
2. Redemonstrated dilated air-filled loops of small bowel in the mid
abdomen raising concern for obstruction.

## 2021-02-07 IMAGING — DX DG ABDOMEN 1V
2 series · 2 of 2 positions shown · non-contrast
Comparison: 04/26/2016

CLINICAL DATA: Abdominal distention for 3 days. Anorexia.

EXAM:
ABDOMEN - 1 VIEW

[abdomen kub (1 of 2)]
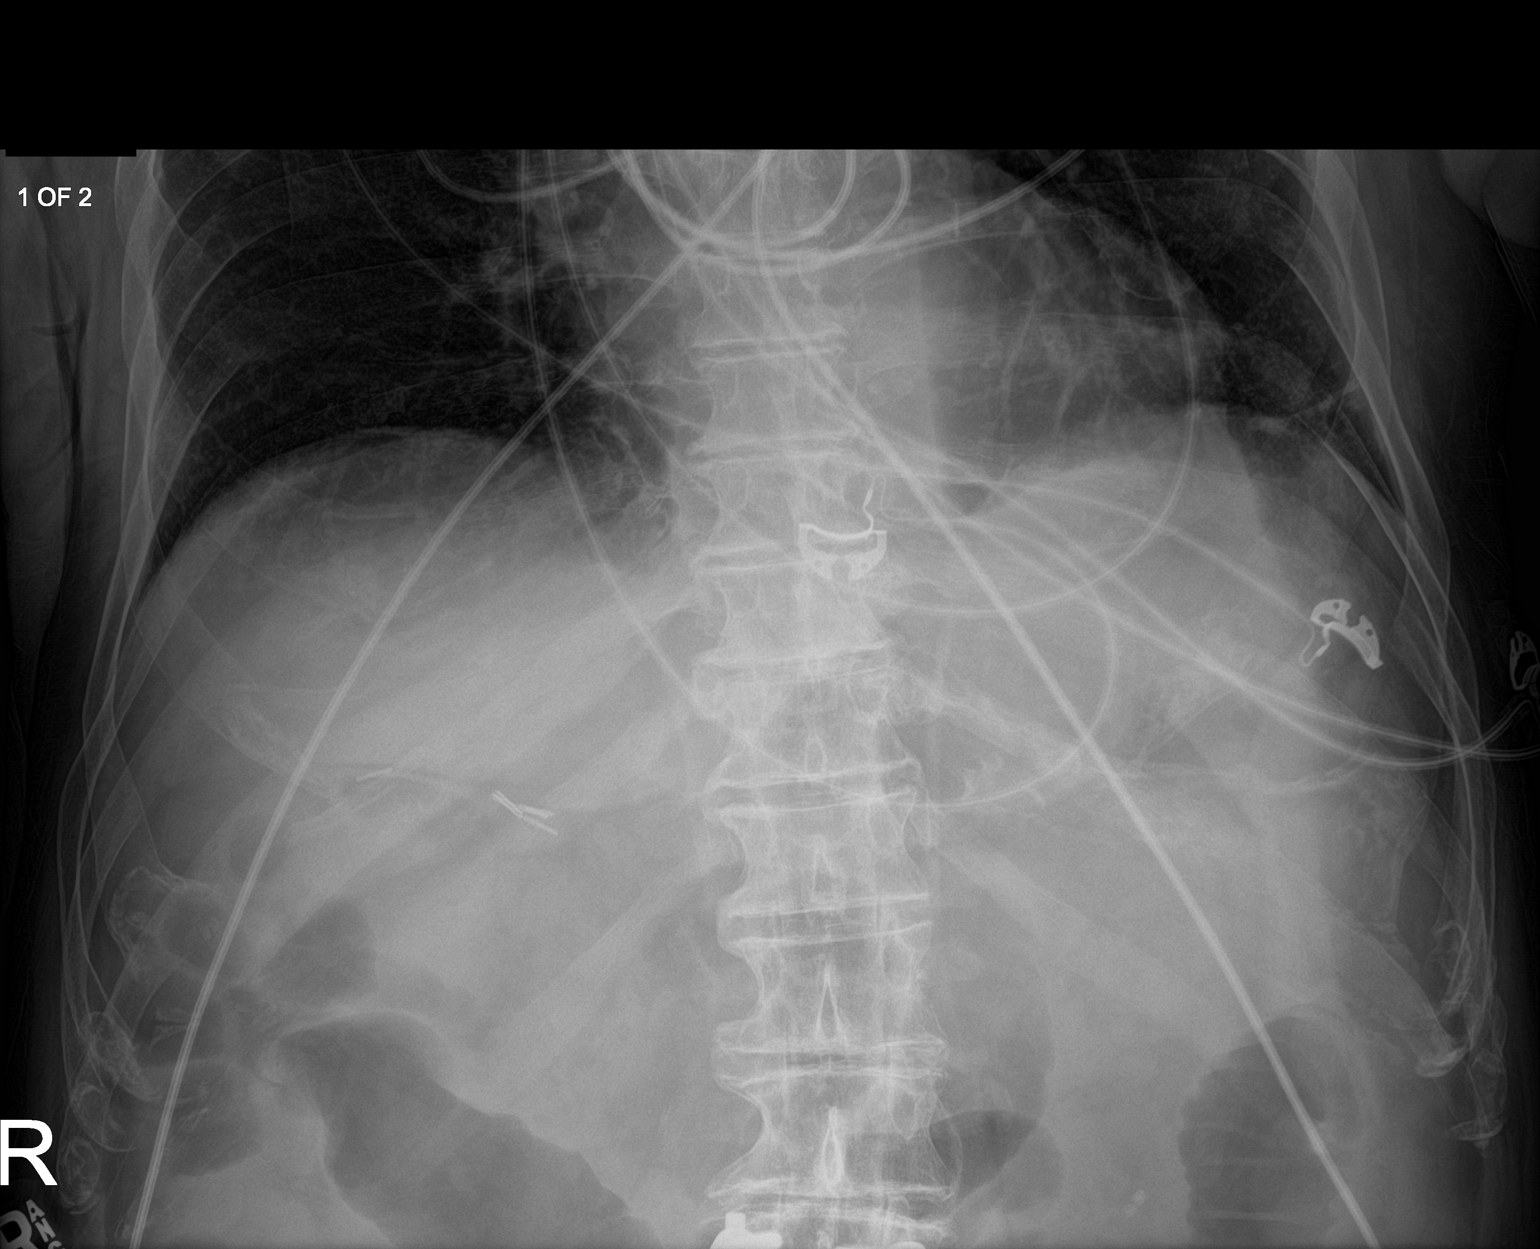

[abdomen kub (2 of 2)]
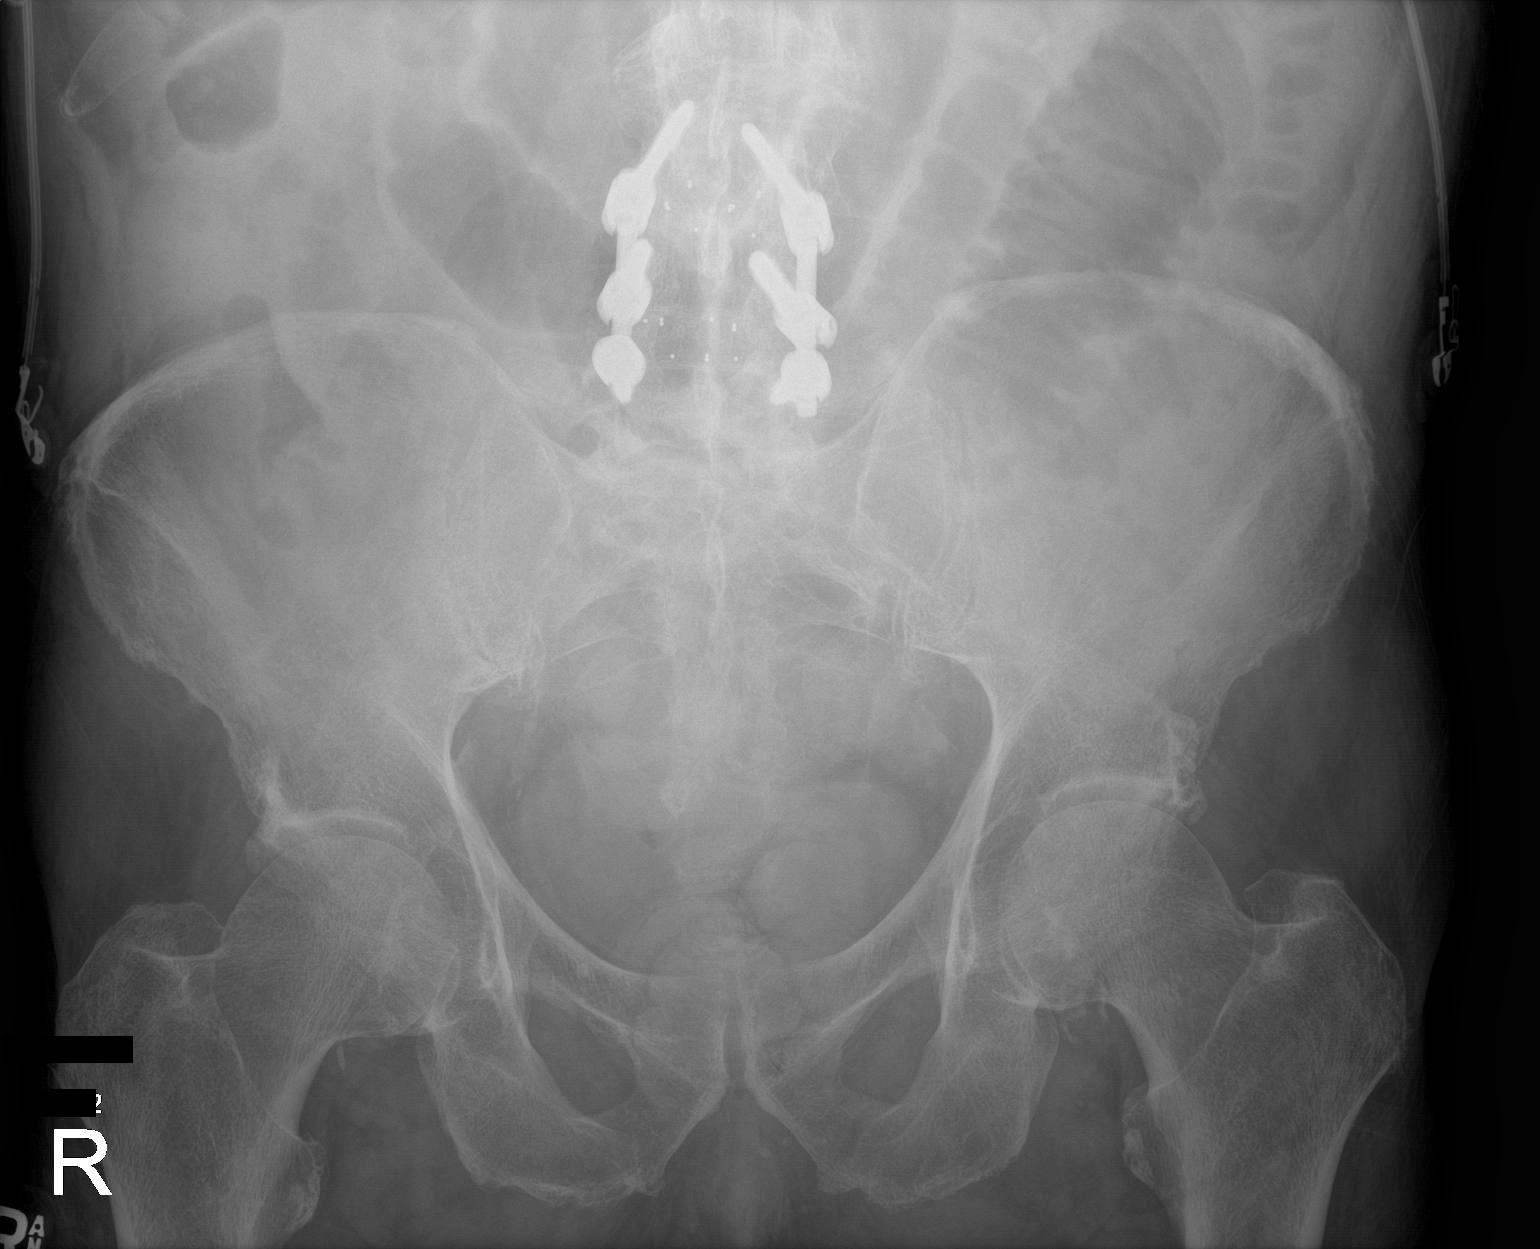

[2 of 2 positions shown; findings below may reference images not displayed]

FINDINGS: Moderately dilated small bowel loops are seen in the lower abdomen,
with paucity of colonic gas. These findings are suspicious for a
distal small bowel obstruction.

Right upper quadrant surgical clips from prior cholecystectomy, and
lumbar spine fusion hardware noted. Probable tiny adjacent less than
5 mm left renal calculi again noted. No definite radiopaque calculi
seen along the course of the ureters or bladder.
IMPRESSION: Findings suspicious for distal small bowel obstruction.

Probable tiny nonobstructing left renal calculi.

## 2021-02-08 IMAGING — MR MR HEAD WO/W CM
10 of 13 series · 33 of 48 positions shown · IV contrast (gadavist)
Comparison: CT head without contrast 07/04/2019 and 06/02/2018

CLINICAL DATA: TIA. Progressive loss of balance and mental status
change. Loss of taste.

EXAM:
MRI HEAD WITHOUT AND WITH CONTRAST
TECHNIQUE: Multiplanar, multiecho pulse sequences of the brain and surrounding
structures were obtained without and with intravenous contrast.
CONTRAST:  10 ML Gadavist

[Series 4: DWI · axial · 3.0mm · 1.09mm/px · z∈[+12,+148]mm · 6 of 96 slices shown (1 of 4)]
[im 1/96]
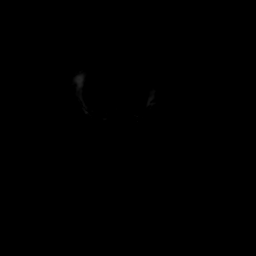
[im 20/96]
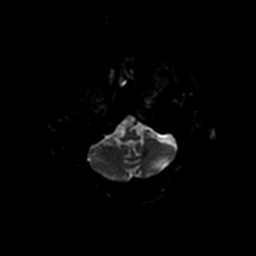
[im 39/96]
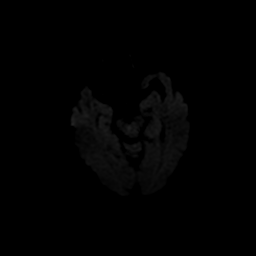
[im 58/96]
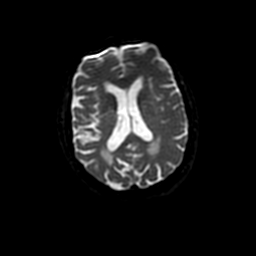
[im 77/96]
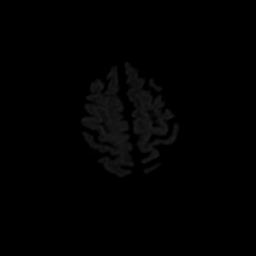
[im 96/96]
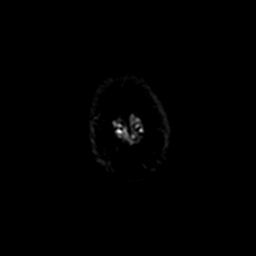

[Series 5: T2 · axial · 5.0mm · 0.90mm/px · 1 of 21 slices shown]
[im 1/21]
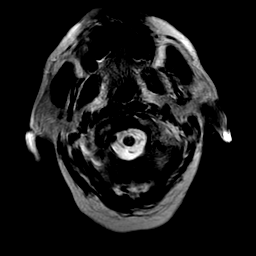

[Series 6: FLAIR · axial · 3.0mm · 0.45mm/px · z∈[+10,+148]mm · 2 of 25 slices shown]
[im 1/25]
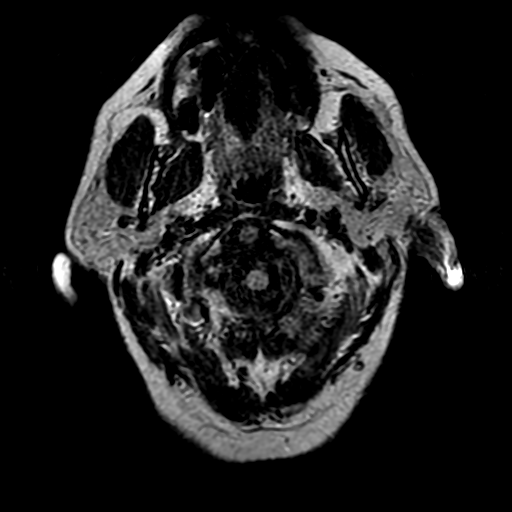
[im 25/25]
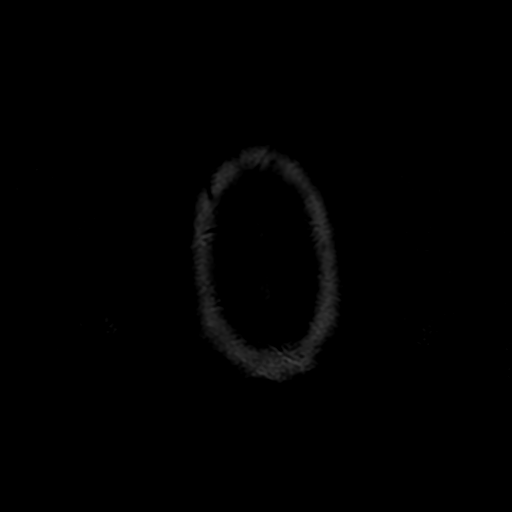

[Series 9: DWI · coronal · 4.0mm · 1.09mm/px · 6 of 82 slices shown (2 of 4)]
[im 1/82]
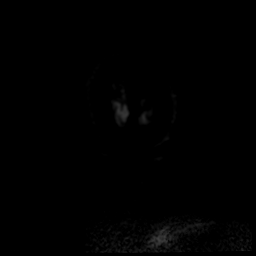
[im 17/82]
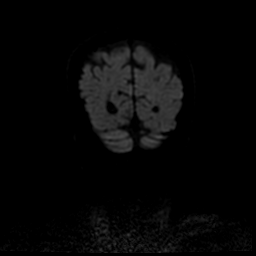
[im 33/82]
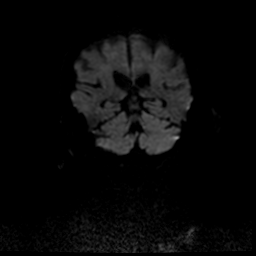
[im 49/82]
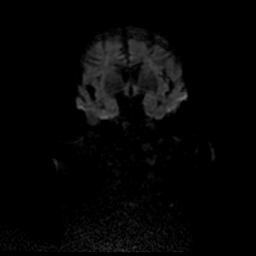
[im 65/82]
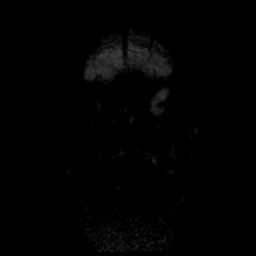
[im 82/82]
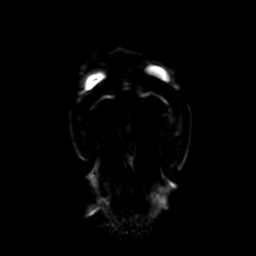

[Series 10: T2 post-contrast · coronal · 5.0mm · 0.90mm/px · 1 of 26 slices shown]
[im 1/26]
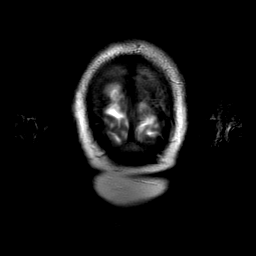

[Series 11: T1 post-contrast · axial · 1.0mm · 0.47mm/px · z∈[+15,+146]mm · 8 of 138 slices shown (1 of 3)]
[im 1/138]
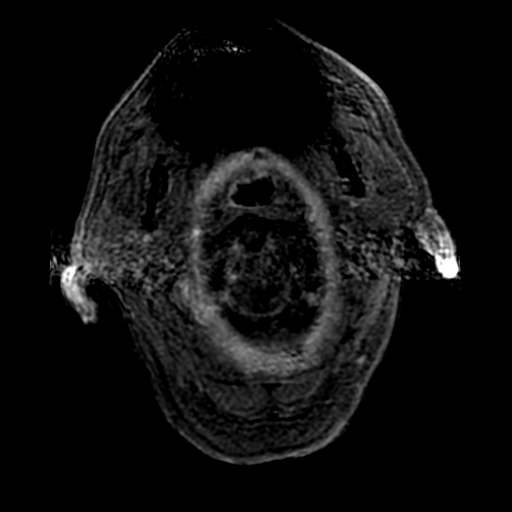
[im 18/138]
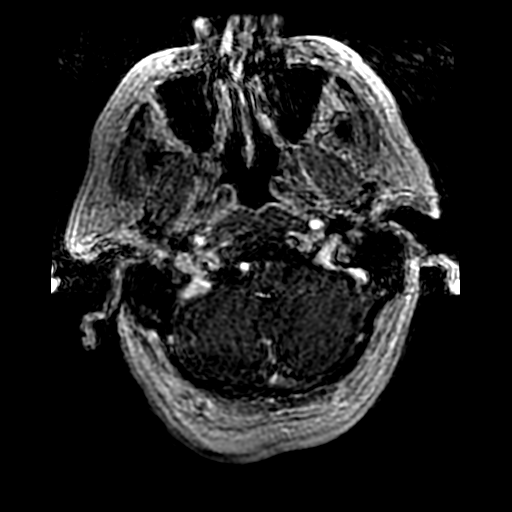
[im 35/138]
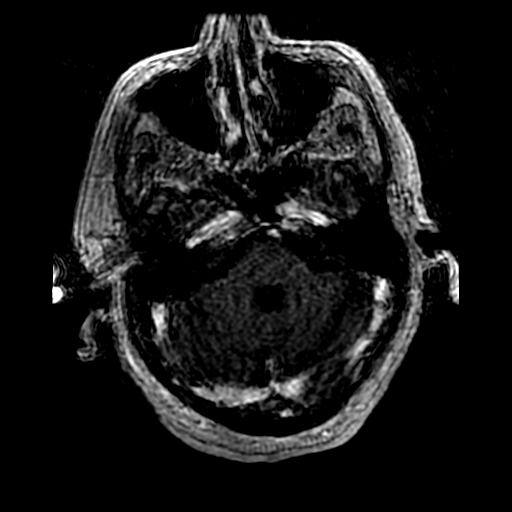
[im 52/138]
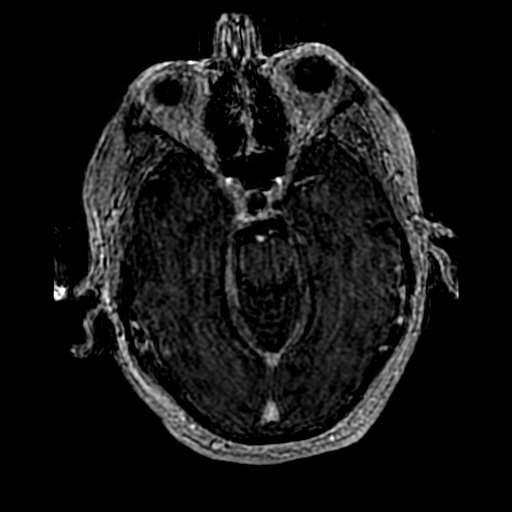
[im 86/138]
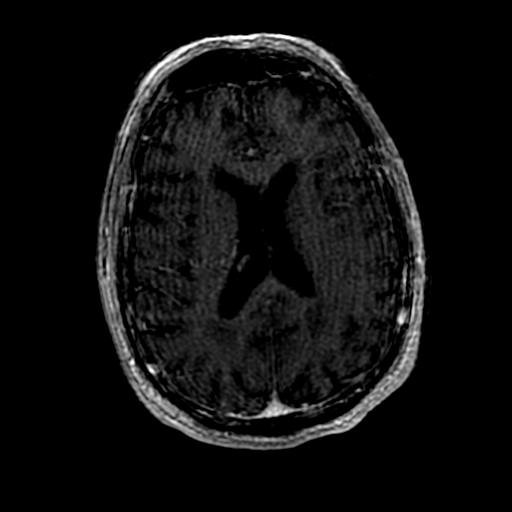
[im 103/138]
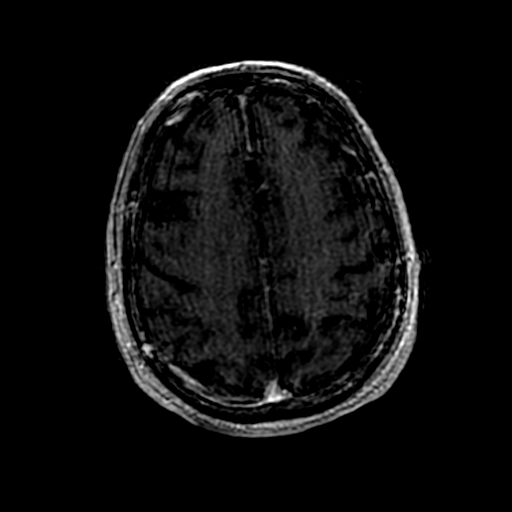
[im 120/138]
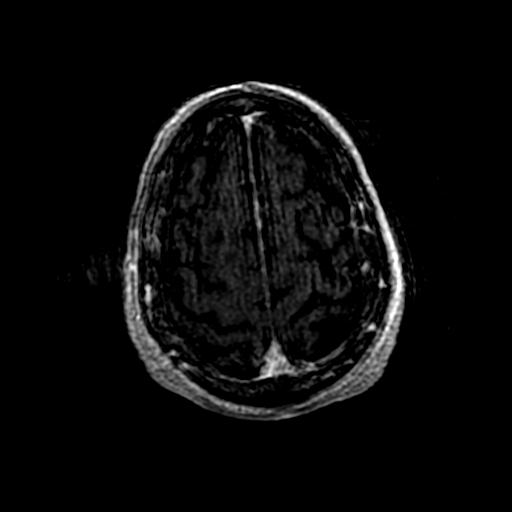
[im 138/138]
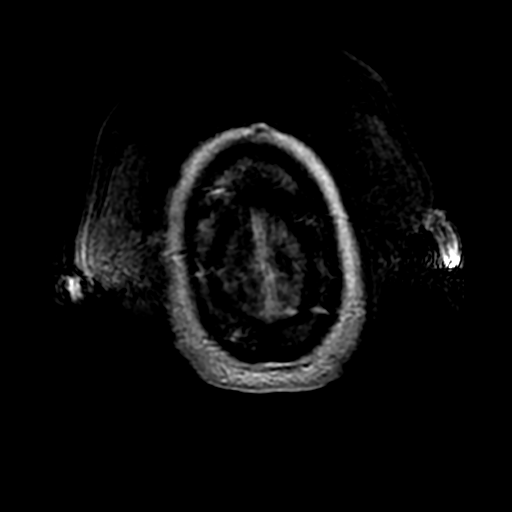

[Series 12: T1 post-contrast · coronal · 5.0mm · 0.43mm/px · 2 of 26 slices shown (2 of 3)]
[im 1/26]
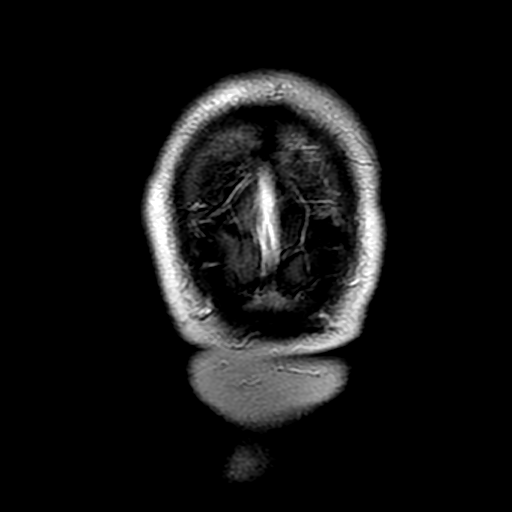
[im 26/26]
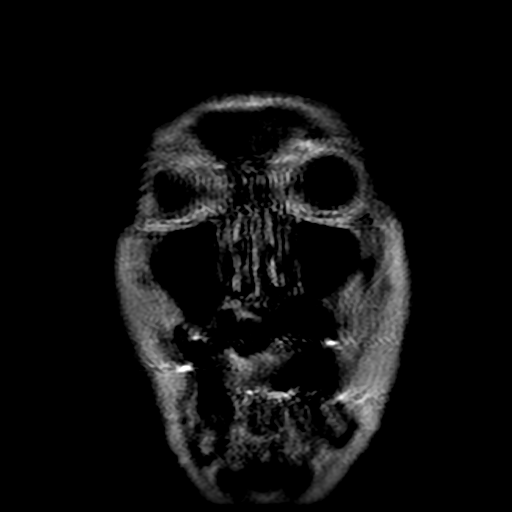

[Series 13: T1 post-contrast · sagittal · 5.0mm · 0.47mm/px · 1 of 20 slices shown (3 of 3)]
[im 1/20]
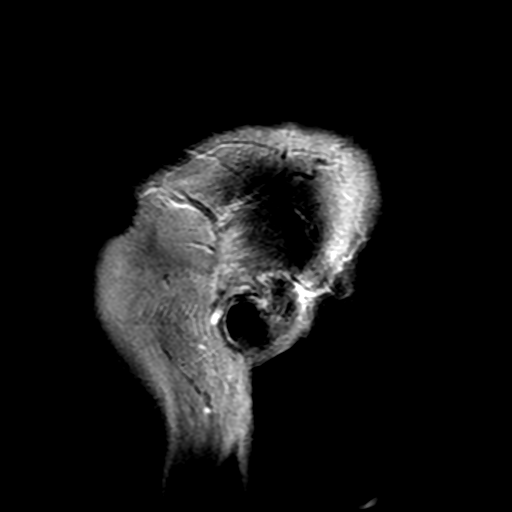

[Series 400: DWI · axial · 3.0mm · 1.09mm/px · z∈[+12,+148]mm · 3 of 48 slices shown (3 of 4)]
[im 1/48]
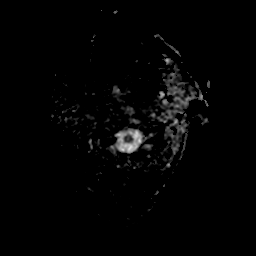
[im 24/48]
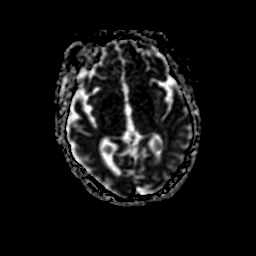
[im 48/48]
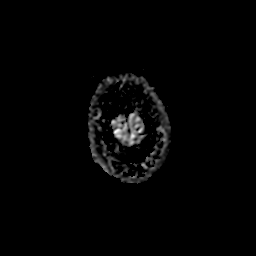

[Series 900: DWI · coronal · 4.0mm · 1.09mm/px · 3 of 41 slices shown (4 of 4)]
[im 1/41]
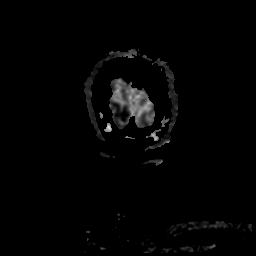
[im 21/41]
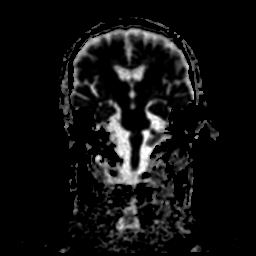
[im 41/41]
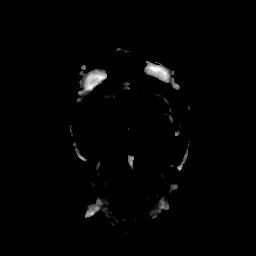

[33 of 48 positions shown; findings below may reference images not displayed]

FINDINGS: Brain: Periventricular white matter changes are mildly advanced for
age. Diffusion-weighted images demonstrate no acute or subacute
infarction. No acute hemorrhage or mass lesion is present. The
ventricles are of normal size. The study is mildly degraded by
patient motion. No significant extraaxial fluid collection is
present. The internal auditory canals are within normal limits. The
brainstem and cerebellum are within normal limits.

Vascular: Flow is present in the major intracranial arteries.

Skull and upper cervical spine: The craniocervical junction is
normal. Upper cervical spine is within normal limits. Marrow signal
is unremarkable.

Sinuses/Orbits: The paranasal sinuses and mastoid air cells are
clear. The globes and orbits are within normal limits.
IMPRESSION: 1. No acute or subacute infarction.
2. No acute intracranial abnormality.
3. Periventricular white matter changes are mildly advanced for age.
This likely reflects the sequela of chronic microvascular ischemia.

## 2021-02-08 IMAGING — DX DG ABD PORTABLE 1V
2 series · 2 of 2 positions shown · non-contrast
Comparison: CT of the abdomen and pelvis on 07/04/2019

CLINICAL DATA: 8 Hour Delay image for Small Bowel protocol
78-year-old male admitted to the medical service with 3-day history
of abdominal discomfort, abdominal distention, nausea and vomiting.

EXAM:
PORTABLE ABDOMEN - 1 VIEW

[abdomen kub (1 of 2)]
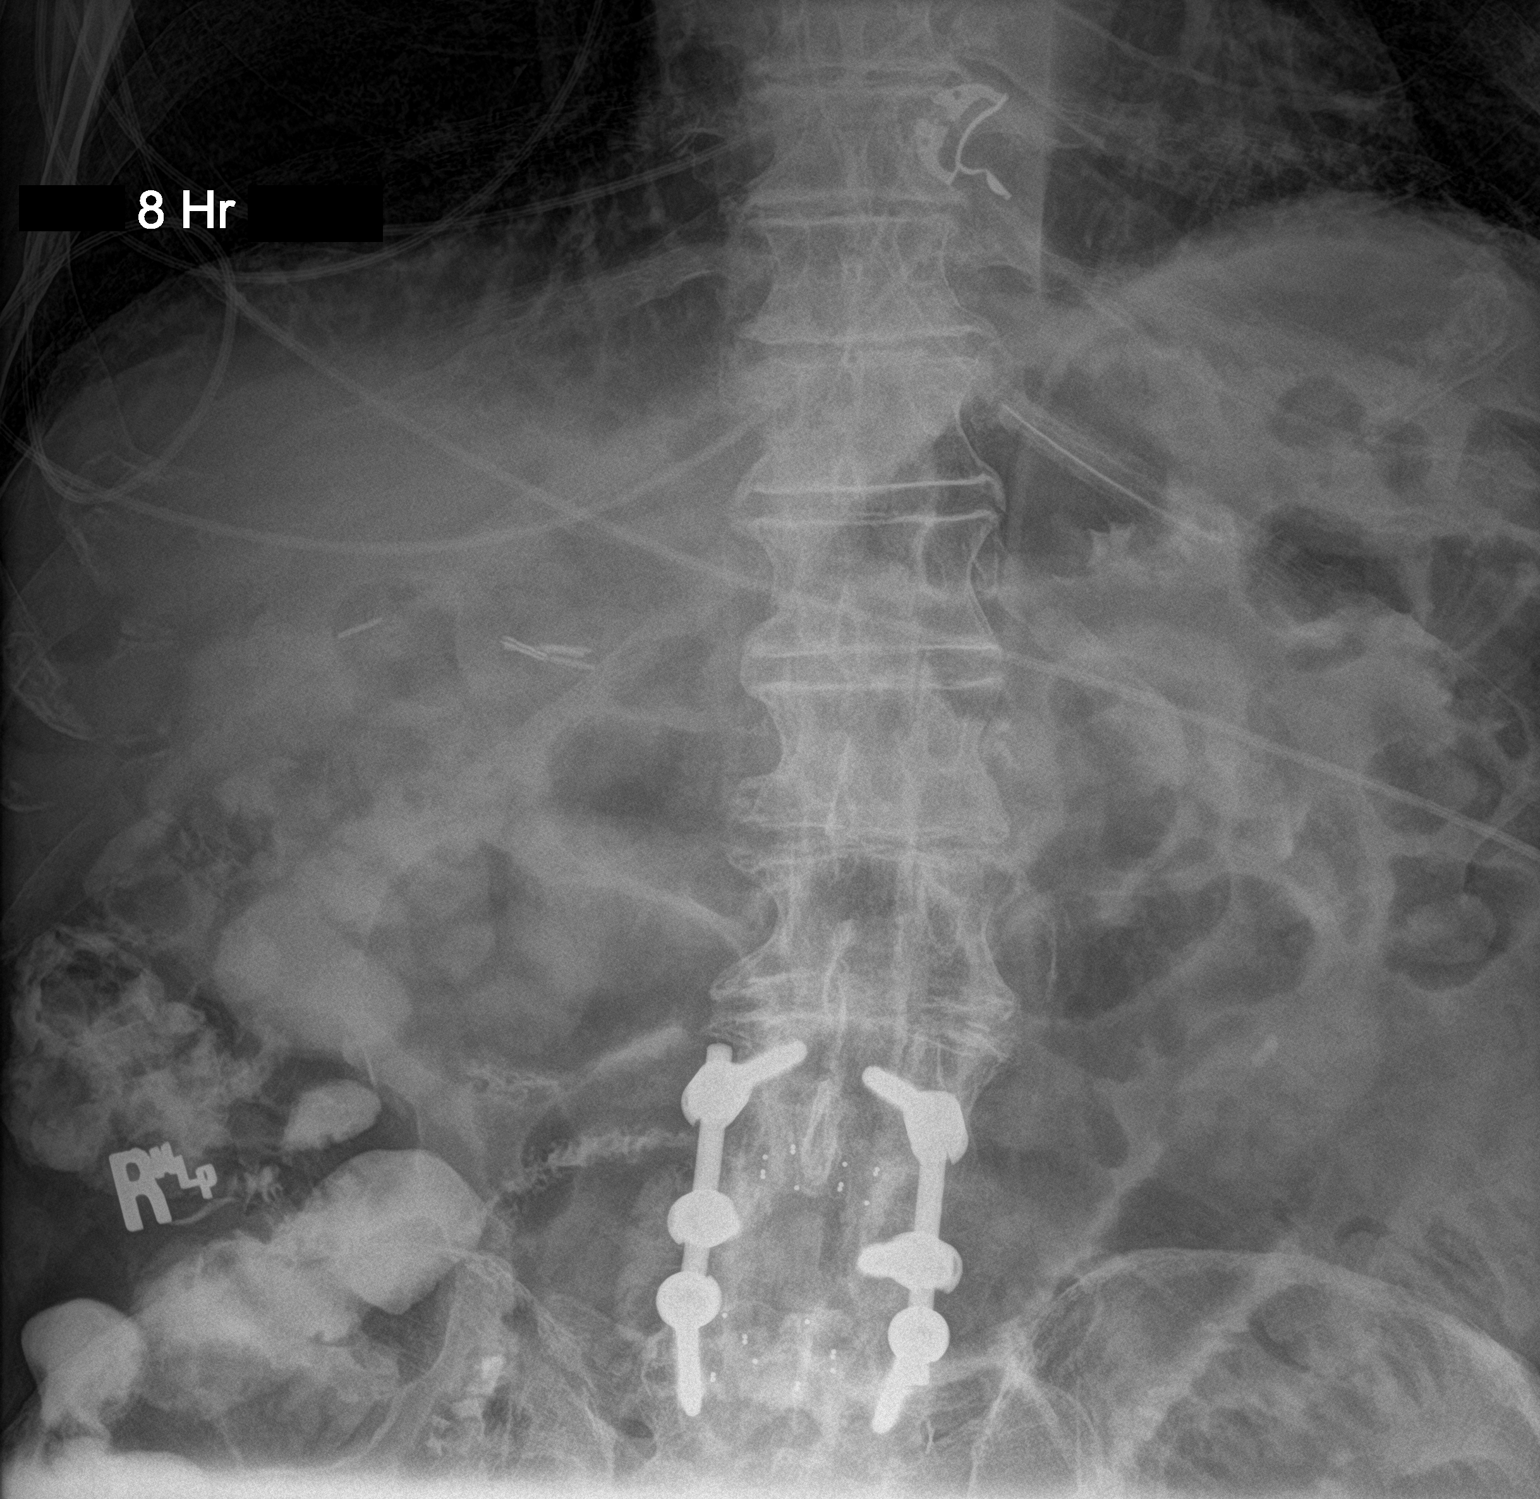

[abdomen kub (2 of 2)]
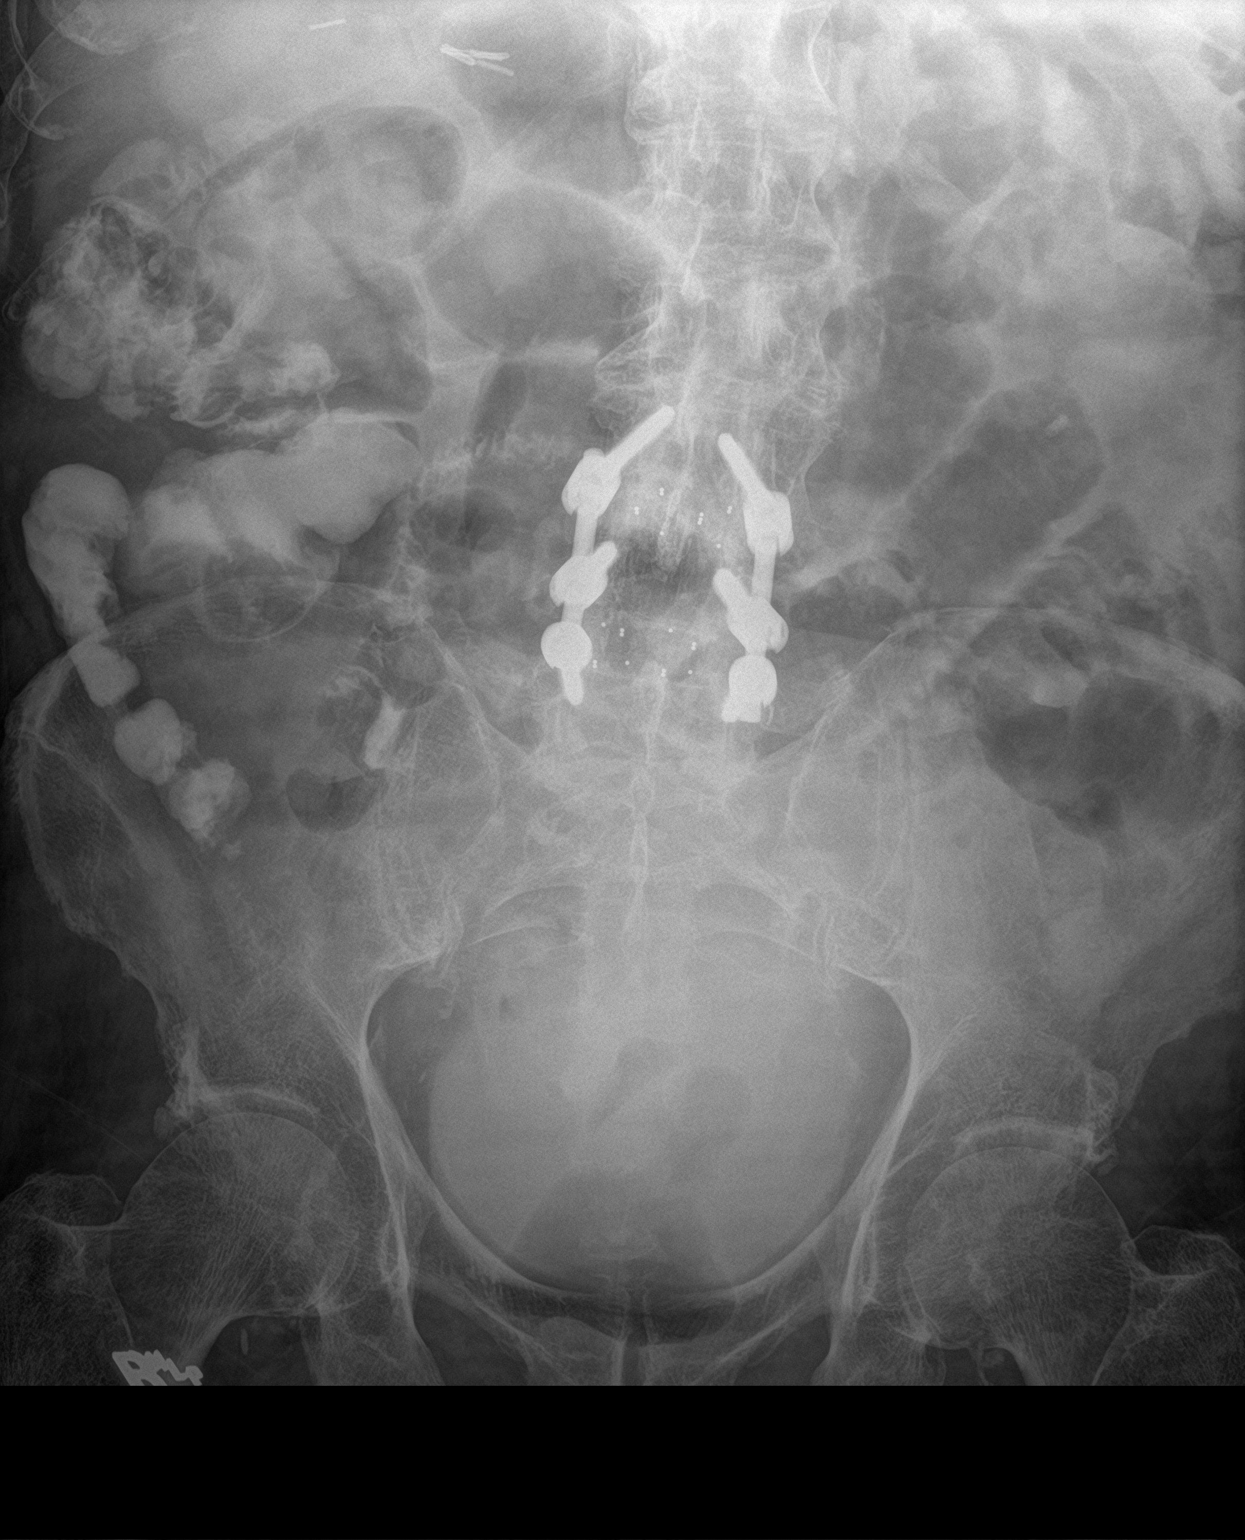

[2 of 2 positions shown; findings below may reference images not displayed]

FINDINGS: There is persistent dilatation of small bowel loops, measuring up to
3.9 centimeters in the central abdomen. Contrast is identified
within nondilated loops of colon following recent CT exam.
Nasogastric tube overlies the stomach. Contrast is identified within
the urinary bladder following CT exam.
IMPRESSION: Similar small bowel dilatation. Presence of contrast in the colon
excludes complete obstruction.

## 2021-02-08 IMAGING — DX DG CHEST 2V
2 series · 3 of 3 positions shown · non-contrast
Comparison: 04/28/2018 and earlier

CLINICAL DATA: 78-year-old male admitted to the medical service
with 3-day history of abdominal discomfort, abdominal distention,
nausea and vomiting. Eval for CHF. Patient has a complex past
medical history.

EXAM:
CHEST - 2 VIEW

[Series 2: chest lat · 0.14mm/px · 2 of 2 slices shown]
[im 1/2]
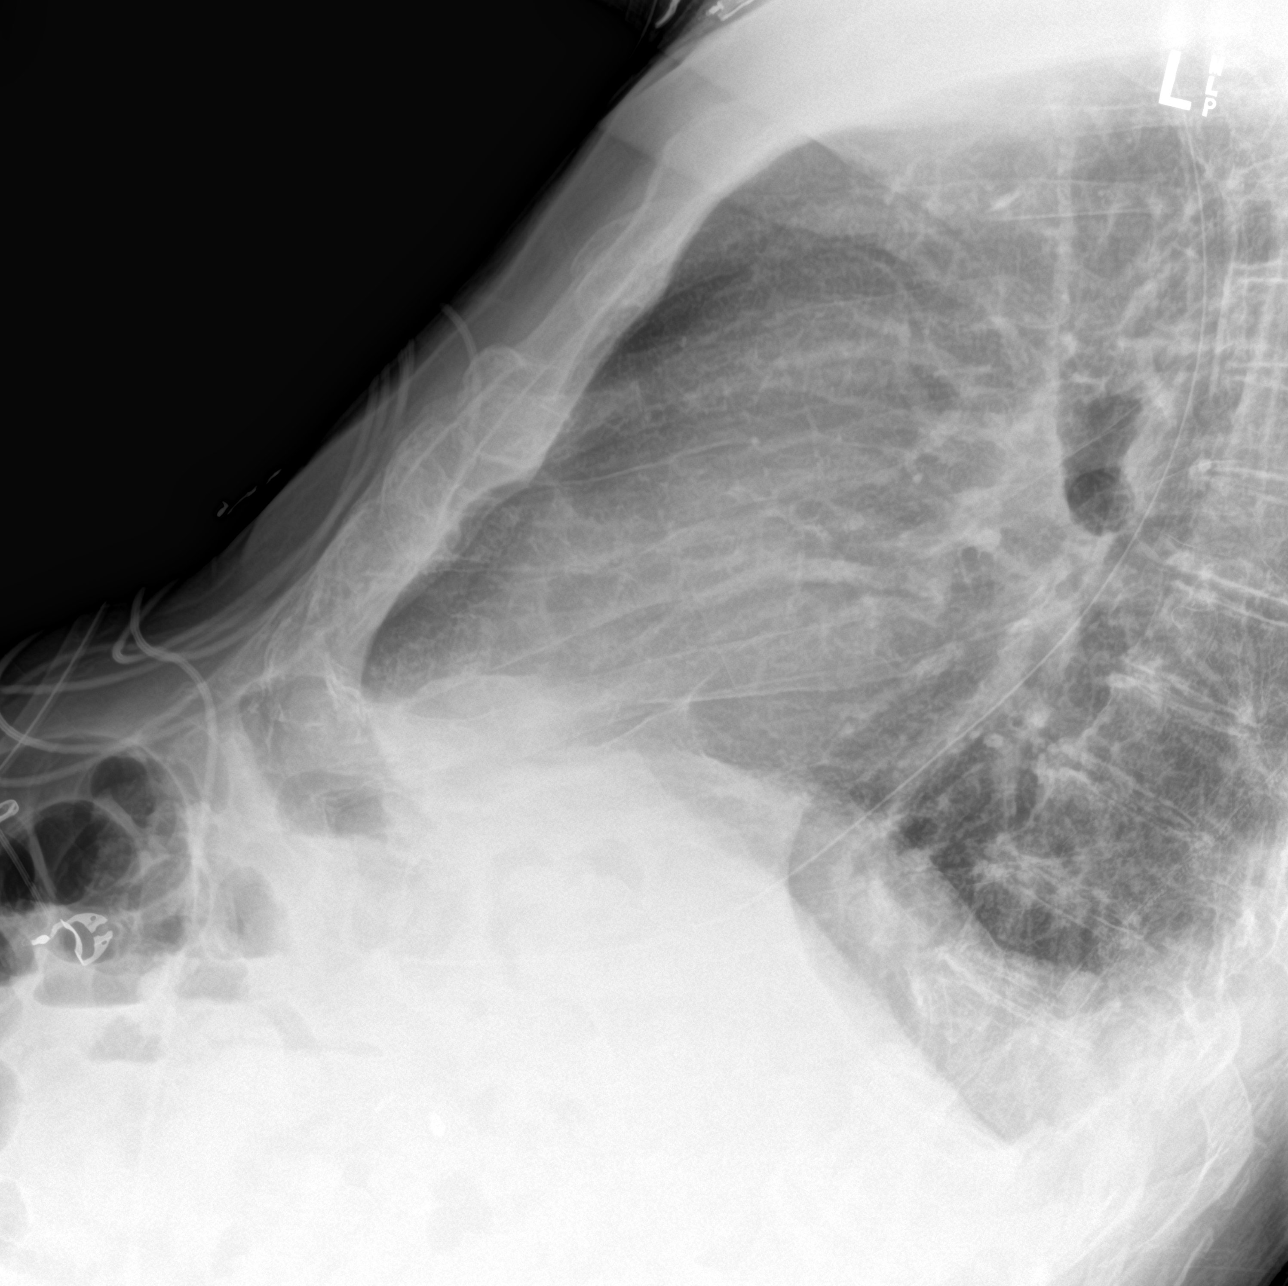
[im 2/2]
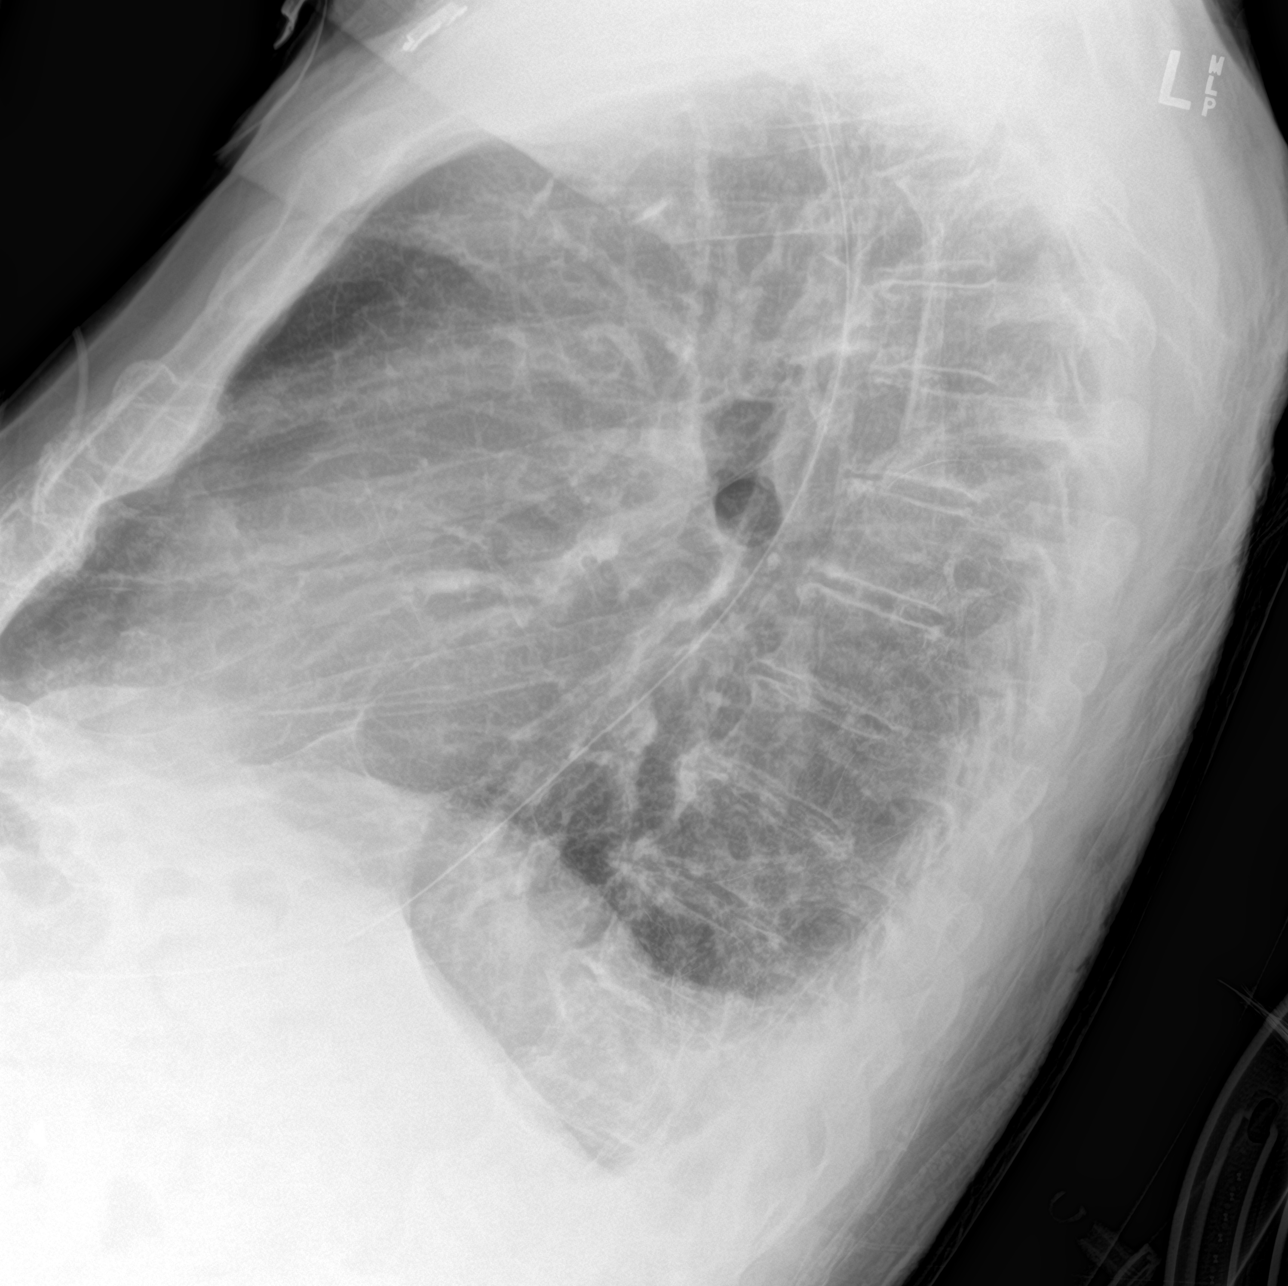

[chest ap]
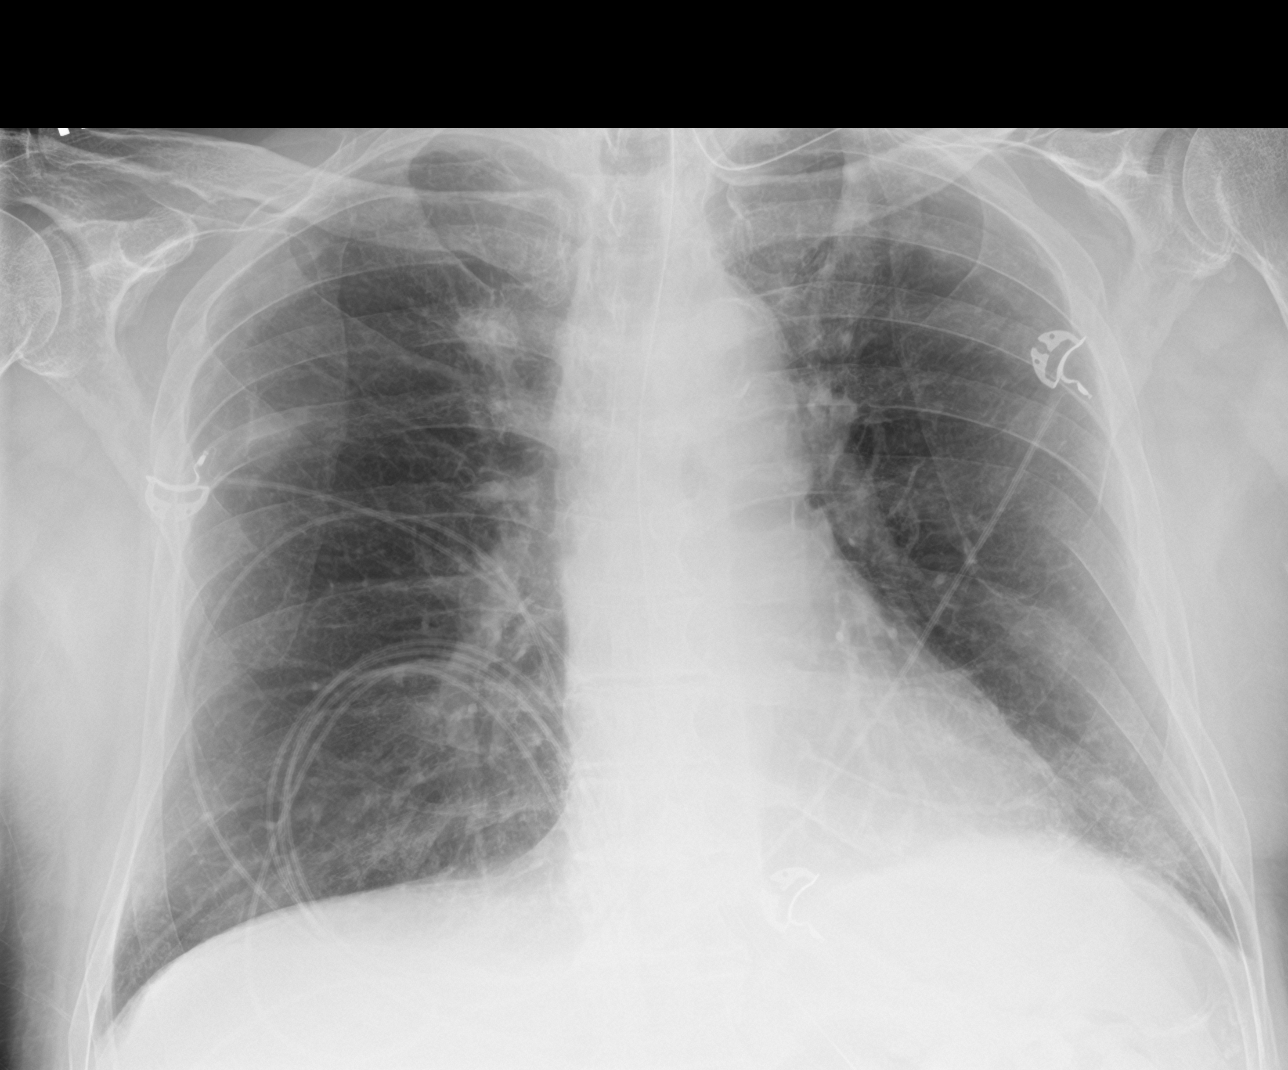

[3 of 3 positions shown; findings below may reference images not displayed]

FINDINGS: Heart size is normal. There is atherosclerotic calcification of the
thoracic aorta. Trace bilateral pleural effusions. No focal
consolidations. Opacity at the RIGHT lung apex overlies the RIGHT
first rib. A separate indistinct opacity is identified in the
LATERAL RIGHT UPPER lobe.
IMPRESSION: 1. Question of RIGHT upper lobe nodules.
2. Further evaluation with chest CT is recommended.
3. Trace bilateral pleural effusions.

## 2021-02-09 IMAGING — DX DG ABDOMEN 2V
3 series · 3 of 3 positions shown · non-contrast
Comparison: CT scan dated 07/04/2019 and abdominal radiographs
dated 07/05/2019

CLINICAL DATA: Abdominal distention with nausea and vomiting. Small
bowel obstruction.

EXAM:
ABDOMEN - 2 VIEW

[abdomen erect]
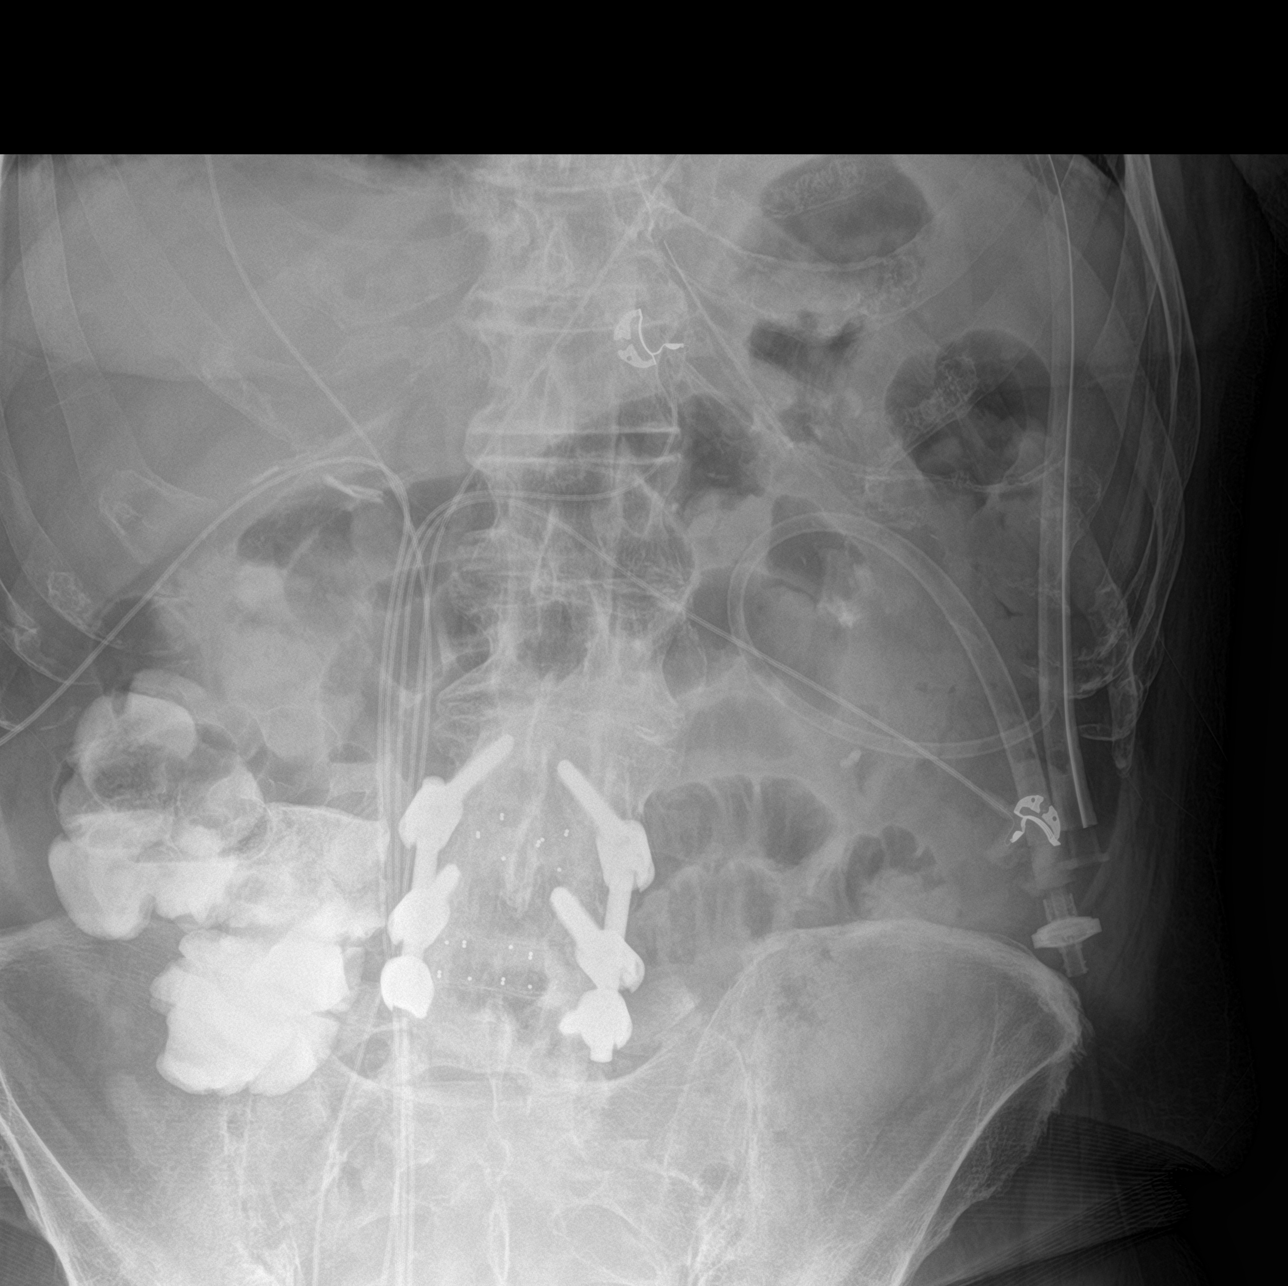

[abdomen supine (1 of 2)]
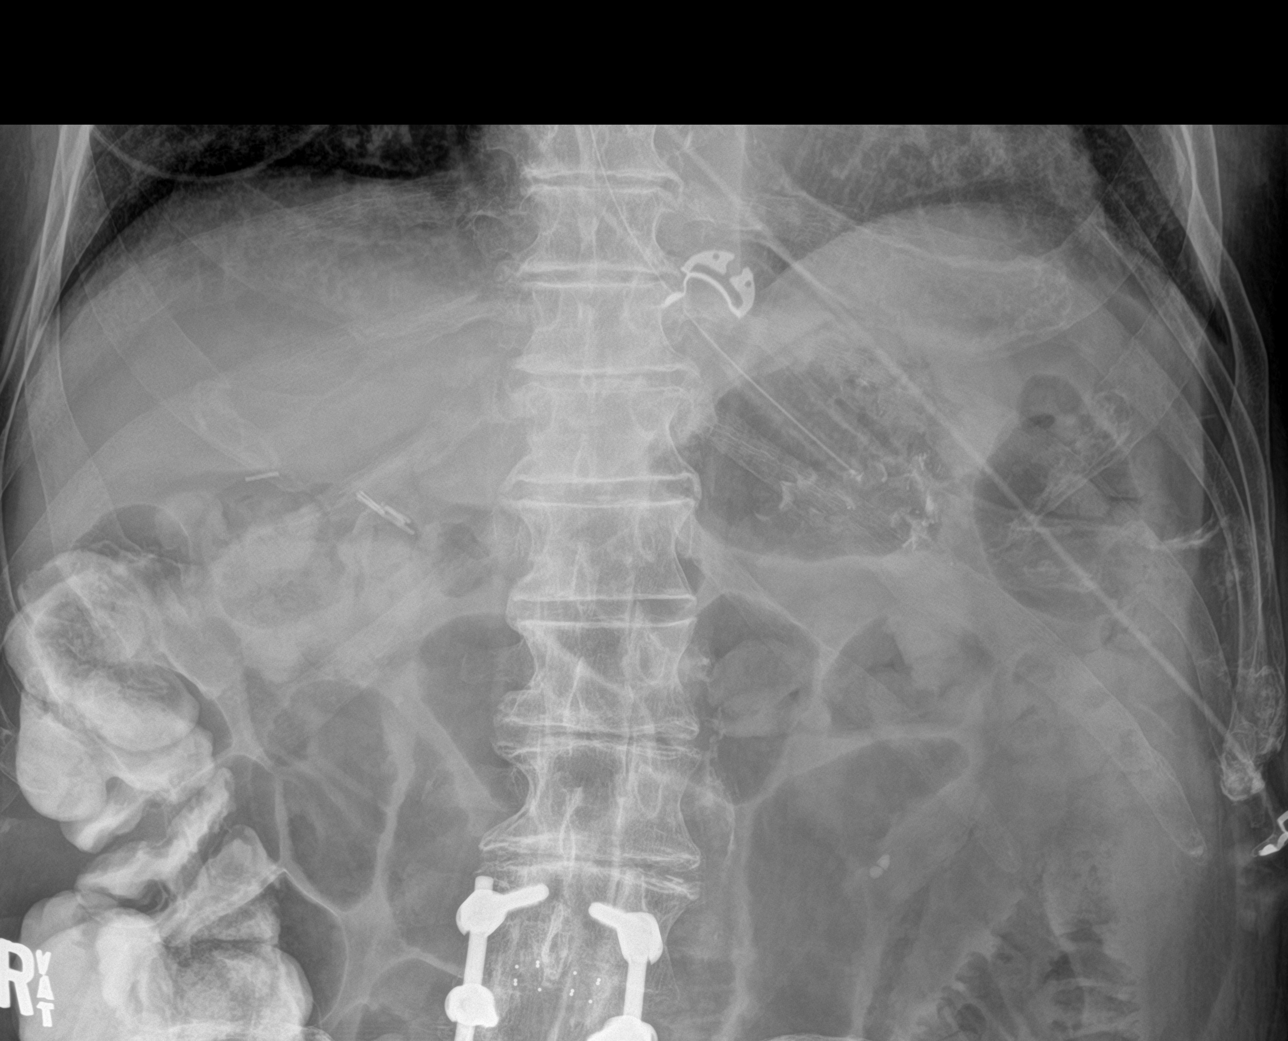

[abdomen supine (2 of 2)]
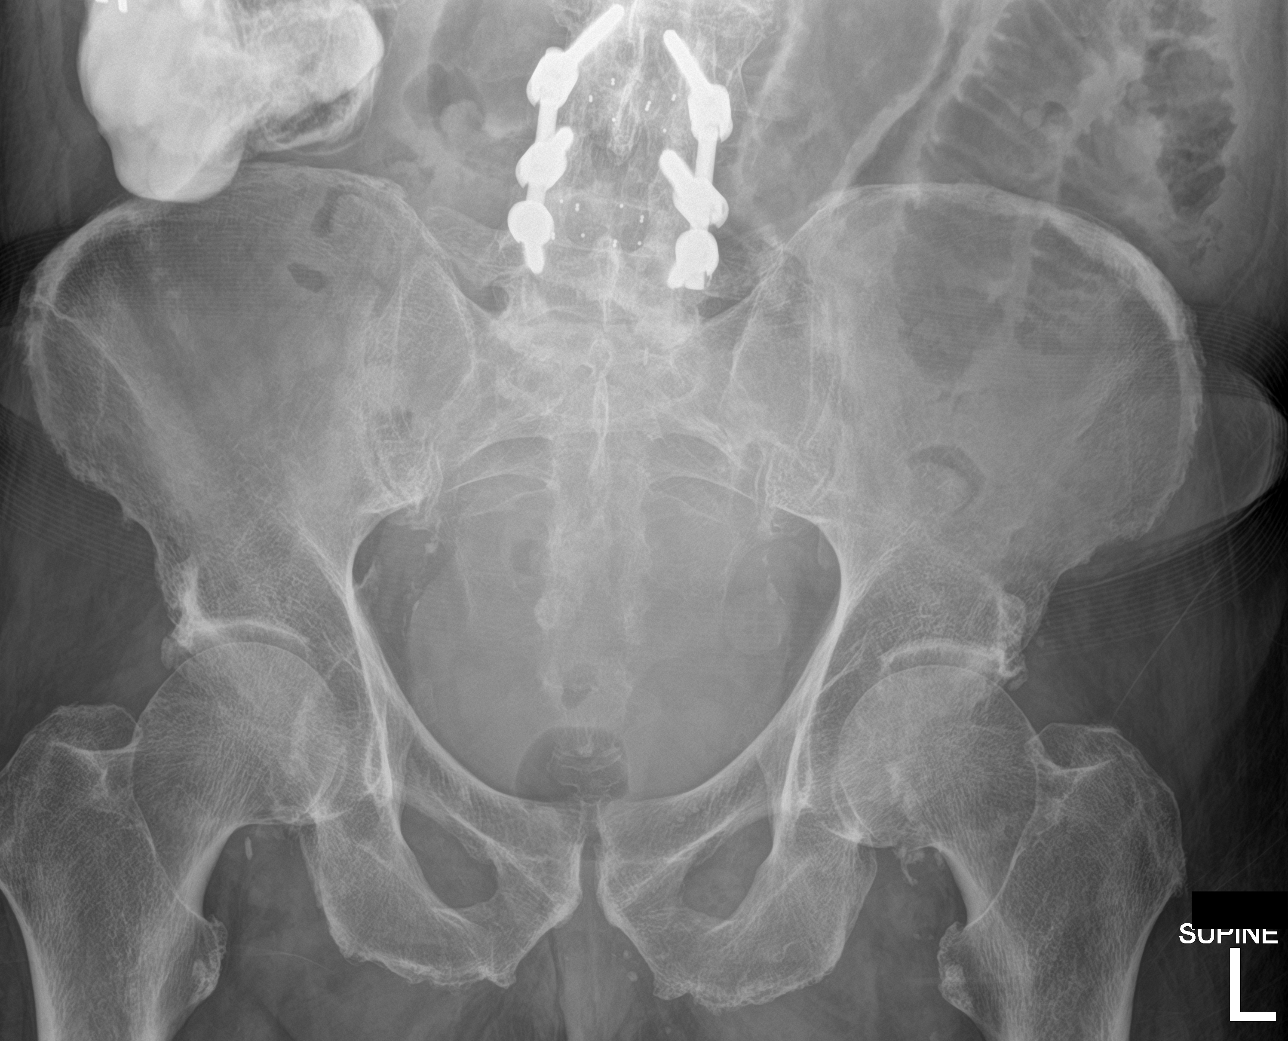

[3 of 3 positions shown; findings below may reference images not displayed]

FINDINGS: NG tube tip is in the mid stomach. Slightly distended small bowel
loops in the left mid abdomen persist. Contrast is present in the
nondistended ascending colon.

No free air.  No acute bone abnormality.
IMPRESSION: Persistent dilated loops of small bowel in the left mid abdomen
consistent with partial small bowel obstruction.

## 2021-02-10 IMAGING — DX DG ABDOMEN 2V
2 series · 2 of 2 positions shown · non-contrast
Comparison: None.

CLINICAL DATA: Follow-up small bowel obstruction

EXAM:
ABDOMEN - 2 VIEW

[abdomen erect]
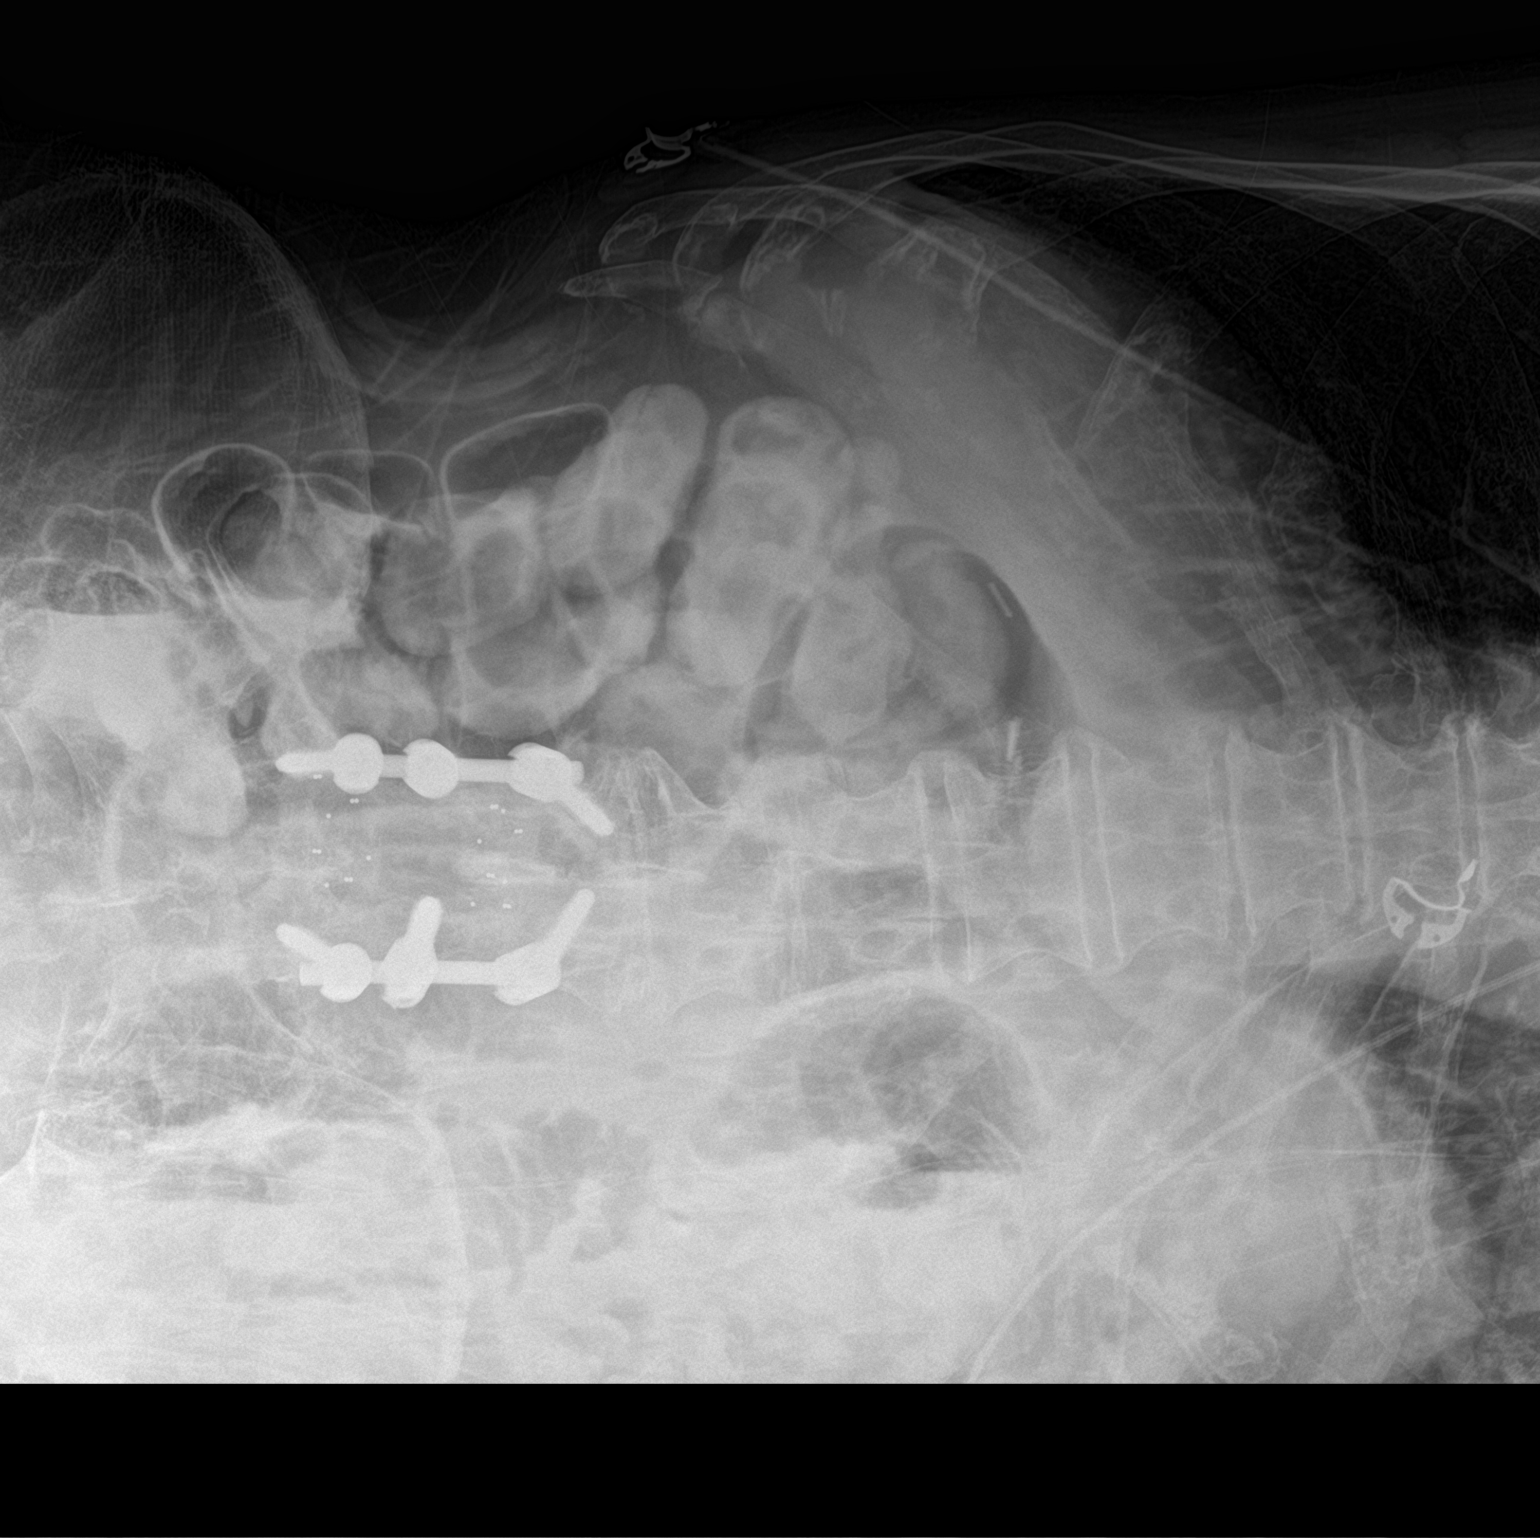

[abdomen supine]
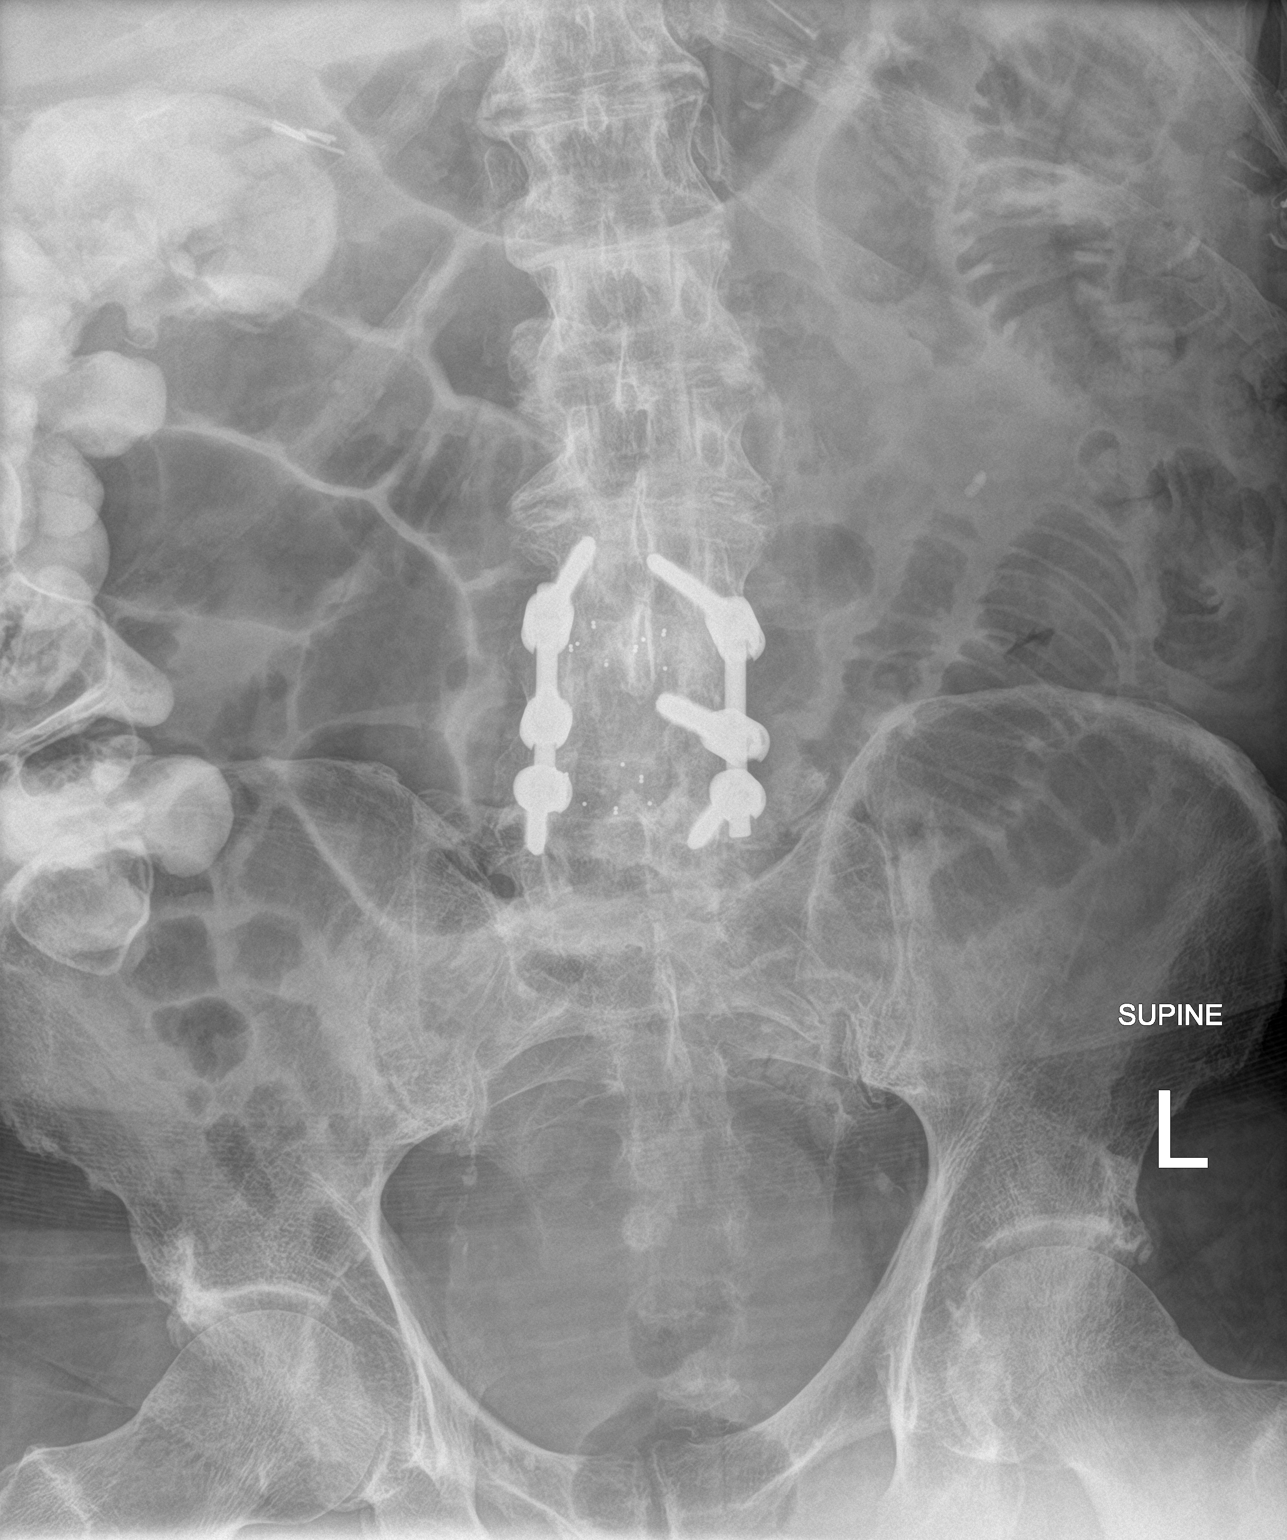

[2 of 2 positions shown; findings below may reference images not displayed]

FINDINGS: Oral contrast material is present in the ascending colon. Persistent
gaseous distension of the small bowel and colon. There is no
evidence of pneumoperitoneum, portal venous gas or pneumatosis.

There are no pathologic calcifications along the expected course of
the ureters.

The osseous structures are unremarkable.
IMPRESSION: Persistent gaseous distension of the small bowel and colon with
transit of the oral contrast material into the ascending colon. This
may reflect persistent partial small bowel obstruction versus an
ileus.

## 2021-02-11 IMAGING — DX DG ABD PORTABLE 1V
1 series · 1 of 1 positions shown · non-contrast
Comparison: 07/07/2019 and 07/04/2019

CLINICAL DATA: Small bowel obstruction.  Clinically improving.

EXAM:
PORTABLE ABDOMEN - 1 VIEW

[abdomen kub]
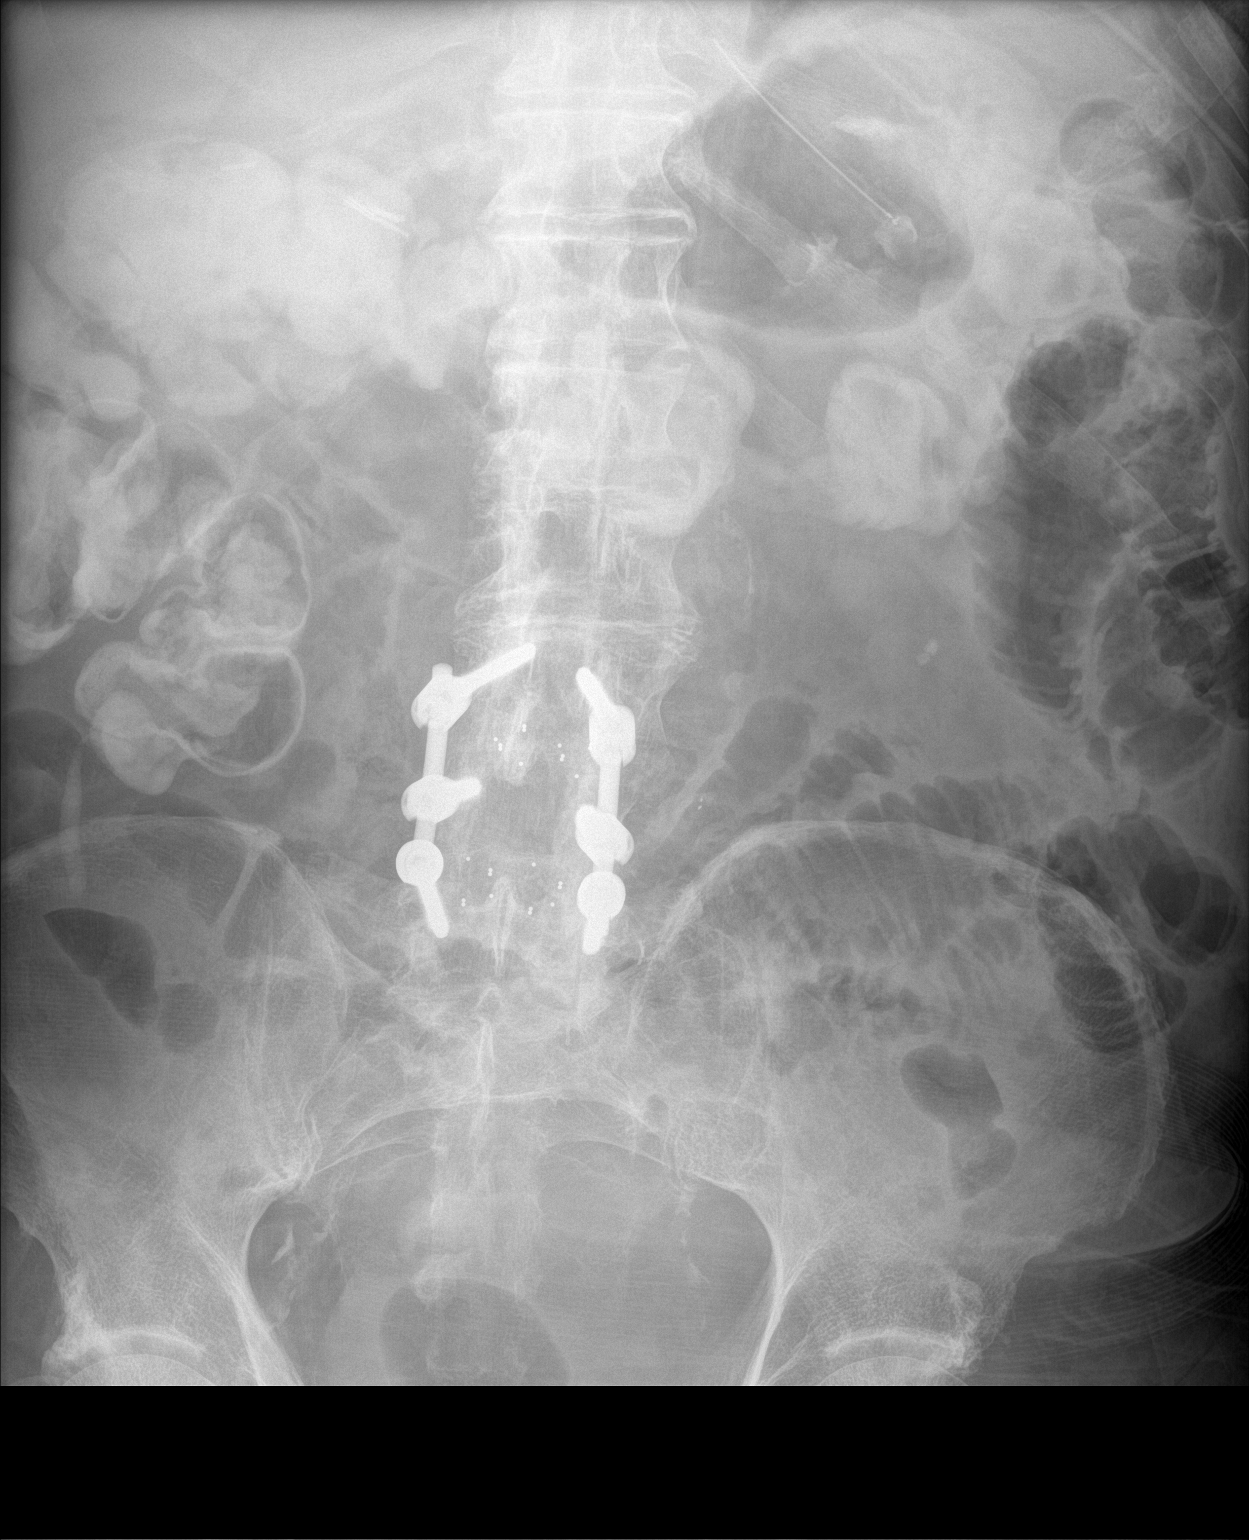

[1 of 1 positions shown; findings below may reference images not displayed]

FINDINGS: Nasogastric tube is present with tip over the stomach in the left
upper quadrant. Bowel gas pattern demonstrates air and contrast
throughout the colon. There is persistence of several air-filled
dilated small bowel loops in the left abdomen with significant
decreased number of small bowel loops. Remainder of the exam is
unchanged.
IMPRESSION: Interval improvement with less number of dilated air-filled small
bowel loops. Air and contrast throughout the colon. Likely improving
partial small bowel obstruction.

## 2021-02-14 DIAGNOSIS — I25119 Atherosclerotic heart disease of native coronary artery with unspecified angina pectoris: Secondary | ICD-10-CM | POA: Diagnosis not present

## 2021-02-14 DIAGNOSIS — E7800 Pure hypercholesterolemia, unspecified: Secondary | ICD-10-CM | POA: Diagnosis not present

## 2021-02-14 DIAGNOSIS — I480 Paroxysmal atrial fibrillation: Secondary | ICD-10-CM | POA: Diagnosis not present

## 2021-02-14 DIAGNOSIS — I4891 Unspecified atrial fibrillation: Secondary | ICD-10-CM | POA: Diagnosis not present

## 2021-02-14 DIAGNOSIS — I5042 Chronic combined systolic (congestive) and diastolic (congestive) heart failure: Secondary | ICD-10-CM | POA: Diagnosis not present

## 2021-02-14 DIAGNOSIS — I252 Old myocardial infarction: Secondary | ICD-10-CM | POA: Diagnosis not present

## 2021-02-14 DIAGNOSIS — E1165 Type 2 diabetes mellitus with hyperglycemia: Secondary | ICD-10-CM | POA: Diagnosis not present

## 2021-02-14 DIAGNOSIS — E1140 Type 2 diabetes mellitus with diabetic neuropathy, unspecified: Secondary | ICD-10-CM | POA: Diagnosis not present

## 2021-02-14 DIAGNOSIS — K219 Gastro-esophageal reflux disease without esophagitis: Secondary | ICD-10-CM | POA: Diagnosis not present

## 2021-02-14 DIAGNOSIS — I10 Essential (primary) hypertension: Secondary | ICD-10-CM | POA: Diagnosis not present

## 2021-02-15 ENCOUNTER — Inpatient Hospital Stay (HOSPITAL_COMMUNITY)
Admission: EM | Admit: 2021-02-15 | Discharge: 2021-02-18 | DRG: 202 | Disposition: A | Discharge status: Home Health | Payer: Medicare Other | Attending: Internal Medicine | Admitting: Internal Medicine

## 2021-02-15 ENCOUNTER — Other Ambulatory Visit: Payer: Self-pay

## 2021-02-15 ENCOUNTER — Encounter (HOSPITAL_COMMUNITY): Payer: Self-pay

## 2021-02-15 ENCOUNTER — Emergency Department (HOSPITAL_COMMUNITY): Payer: Medicare Other

## 2021-02-15 DIAGNOSIS — I480 Paroxysmal atrial fibrillation: Secondary | ICD-10-CM | POA: Diagnosis not present

## 2021-02-15 DIAGNOSIS — E7849 Other hyperlipidemia: Secondary | ICD-10-CM | POA: Diagnosis not present

## 2021-02-15 DIAGNOSIS — Z833 Family history of diabetes mellitus: Secondary | ICD-10-CM | POA: Diagnosis not present

## 2021-02-15 DIAGNOSIS — Z794 Long term (current) use of insulin: Secondary | ICD-10-CM | POA: Diagnosis not present

## 2021-02-15 DIAGNOSIS — R059 Cough, unspecified: Secondary | ICD-10-CM | POA: Diagnosis not present

## 2021-02-15 DIAGNOSIS — I252 Old myocardial infarction: Secondary | ICD-10-CM

## 2021-02-15 DIAGNOSIS — Z743 Need for continuous supervision: Secondary | ICD-10-CM | POA: Diagnosis not present

## 2021-02-15 DIAGNOSIS — J209 Acute bronchitis, unspecified: Secondary | ICD-10-CM | POA: Diagnosis present

## 2021-02-15 DIAGNOSIS — Z20822 Contact with and (suspected) exposure to covid-19: Secondary | ICD-10-CM

## 2021-02-15 DIAGNOSIS — I2510 Atherosclerotic heart disease of native coronary artery without angina pectoris: Secondary | ICD-10-CM | POA: Diagnosis present

## 2021-02-15 DIAGNOSIS — E1142 Type 2 diabetes mellitus with diabetic polyneuropathy: Secondary | ICD-10-CM | POA: Diagnosis not present

## 2021-02-15 DIAGNOSIS — G4733 Obstructive sleep apnea (adult) (pediatric): Secondary | ICD-10-CM | POA: Diagnosis not present

## 2021-02-15 DIAGNOSIS — Z8249 Family history of ischemic heart disease and other diseases of the circulatory system: Secondary | ICD-10-CM | POA: Diagnosis not present

## 2021-02-15 DIAGNOSIS — E1122 Type 2 diabetes mellitus with diabetic chronic kidney disease: Secondary | ICD-10-CM | POA: Diagnosis present

## 2021-02-15 DIAGNOSIS — Z8261 Family history of arthritis: Secondary | ICD-10-CM

## 2021-02-15 DIAGNOSIS — Z79899 Other long term (current) drug therapy: Secondary | ICD-10-CM

## 2021-02-15 DIAGNOSIS — R918 Other nonspecific abnormal finding of lung field: Secondary | ICD-10-CM | POA: Diagnosis not present

## 2021-02-15 DIAGNOSIS — K219 Gastro-esophageal reflux disease without esophagitis: Secondary | ICD-10-CM | POA: Diagnosis not present

## 2021-02-15 DIAGNOSIS — E785 Hyperlipidemia, unspecified: Secondary | ICD-10-CM | POA: Diagnosis present

## 2021-02-15 DIAGNOSIS — I714 Abdominal aortic aneurysm, without rupture: Secondary | ICD-10-CM | POA: Diagnosis present

## 2021-02-15 DIAGNOSIS — I5042 Chronic combined systolic (congestive) and diastolic (congestive) heart failure: Secondary | ICD-10-CM | POA: Diagnosis not present

## 2021-02-15 DIAGNOSIS — I255 Ischemic cardiomyopathy: Secondary | ICD-10-CM | POA: Diagnosis not present

## 2021-02-15 DIAGNOSIS — R0602 Shortness of breath: Secondary | ICD-10-CM | POA: Diagnosis not present

## 2021-02-15 DIAGNOSIS — R0902 Hypoxemia: Secondary | ICD-10-CM | POA: Diagnosis not present

## 2021-02-15 DIAGNOSIS — Z87891 Personal history of nicotine dependence: Secondary | ICD-10-CM

## 2021-02-15 DIAGNOSIS — R509 Fever, unspecified: Secondary | ICD-10-CM | POA: Diagnosis not present

## 2021-02-15 DIAGNOSIS — Z7901 Long term (current) use of anticoagulants: Secondary | ICD-10-CM

## 2021-02-15 DIAGNOSIS — R079 Chest pain, unspecified: Secondary | ICD-10-CM | POA: Diagnosis not present

## 2021-02-15 DIAGNOSIS — R531 Weakness: Secondary | ICD-10-CM | POA: Diagnosis not present

## 2021-02-15 DIAGNOSIS — J9601 Acute respiratory failure with hypoxia: Secondary | ICD-10-CM

## 2021-02-15 DIAGNOSIS — I130 Hypertensive heart and chronic kidney disease with heart failure and stage 1 through stage 4 chronic kidney disease, or unspecified chronic kidney disease: Secondary | ICD-10-CM | POA: Diagnosis present

## 2021-02-15 DIAGNOSIS — N1831 Chronic kidney disease, stage 3a: Secondary | ICD-10-CM | POA: Diagnosis present

## 2021-02-15 LAB — CBC WITH DIFFERENTIAL/PLATELET
Abs Immature Granulocytes: 0.19 10*3/uL — ABNORMAL HIGH (ref 0.00–0.07)
Basophils Absolute: 0 10*3/uL (ref 0.0–0.1)
Basophils Relative: 0 %
Eosinophils Absolute: 0 10*3/uL (ref 0.0–0.5)
Eosinophils Relative: 0 %
HCT: 35.5 % — ABNORMAL LOW (ref 39.0–52.0)
Hemoglobin: 12 g/dL — ABNORMAL LOW (ref 13.0–17.0)
Immature Granulocytes: 3 %
Lymphocytes Relative: 6 %
Lymphs Abs: 0.4 10*3/uL — ABNORMAL LOW (ref 0.7–4.0)
MCH: 32 pg (ref 26.0–34.0)
MCHC: 33.8 g/dL (ref 30.0–36.0)
MCV: 94.7 fL (ref 80.0–100.0)
Monocytes Absolute: 1.2 10*3/uL — ABNORMAL HIGH (ref 0.1–1.0)
Monocytes Relative: 20 %
Neutro Abs: 4.2 10*3/uL (ref 1.7–7.7)
Neutrophils Relative %: 71 %
Platelets: 165 10*3/uL (ref 150–400)
RBC: 3.75 MIL/uL — ABNORMAL LOW (ref 4.22–5.81)
RDW: 15.1 % (ref 11.5–15.5)
WBC: 5.9 10*3/uL (ref 4.0–10.5)
nRBC: 0 % (ref 0.0–0.2)

## 2021-02-15 LAB — COMPREHENSIVE METABOLIC PANEL
ALT: 25 U/L (ref 0–44)
AST: 27 U/L (ref 15–41)
Albumin: 3.6 g/dL (ref 3.5–5.0)
Alkaline Phosphatase: 61 U/L (ref 38–126)
Anion gap: 8 (ref 5–15)
BUN: 23 mg/dL (ref 8–23)
CO2: 23 mmol/L (ref 22–32)
Calcium: 9.1 mg/dL (ref 8.9–10.3)
Chloride: 102 mmol/L (ref 98–111)
Creatinine, Ser: 1.5 mg/dL — ABNORMAL HIGH (ref 0.61–1.24)
GFR, Estimated: 46 mL/min — ABNORMAL LOW (ref >60)
Glucose, Bld: 119 mg/dL — ABNORMAL HIGH (ref 70–99)
Potassium: 4.6 mmol/L (ref 3.5–5.1)
Sodium: 133 mmol/L — ABNORMAL LOW (ref 135–145)
Total Bilirubin: 0.8 mg/dL (ref 0.3–1.2)
Total Protein: 7.1 g/dL (ref 6.5–8.1)

## 2021-02-15 LAB — URINALYSIS, ROUTINE W REFLEX MICROSCOPIC
Bilirubin Urine: NEGATIVE
Glucose, UA: >=500 mg/dL — AB
Ketones, ur: NEGATIVE mg/dL
Leukocytes,Ua: NEGATIVE
Nitrite: NEGATIVE
Protein, ur: 30 mg/dL — AB
RBC / HPF: >50 RBC/hpf — ABNORMAL HIGH (ref 0–5)
Specific Gravity, Urine: 1.021 (ref 1.005–1.030)
pH: 5 (ref 5.0–8.0)

## 2021-02-15 LAB — TROPONIN I (HIGH SENSITIVITY): Troponin I (High Sensitivity): 5 ng/L (ref <18)

## 2021-02-15 LAB — RESP PANEL BY RT-PCR (FLU A&B, COVID) ARPGX2
Influenza A by PCR: NEGATIVE
Influenza B by PCR: NEGATIVE
SARS Coronavirus 2 by RT PCR: NEGATIVE

## 2021-02-15 MED ORDER — ONDANSETRON HCL 4 MG/2ML IJ SOLN
4.0000 mg | Freq: Once | INTRAMUSCULAR | Status: DC
  Filled 2021-02-15: qty 2

## 2021-02-15 NOTE — ED Triage Notes (Signed)
Pt to ED by EMS from home with c/o fever, nausea, lethargy, chills  and weakness which began yesterday. Reports temp of 102.6 at home. Pt received 1 gram of tylenol prior to arrival, temp now 99.9 oral. [place] and believes he may have gotten covid there. RA SAT was 80-84 per EMS, pt SATs are 96% on 2LNC. Dry cough also noted. All other VSS.

## 2021-02-15 NOTE — ED Provider Notes (Signed)
[facility] DEPT Provider Note   CSN: [id number] Arrival date & time: 02/15/21  2121     History No chief complaint on file.   [name] is a 80 y.o. male.  Presented to ER with multiple complaints.  States that he has been feeling generally unwell since yesterday.  Nausea, lethargy, chills.  Had fever up to 102.6 at home.  EMS reported saturations low at 80 to 84% on room air and placed on 2 L nasal cannula.  Patient states he has been vaccinated against COVID-19.  States that there were multiple church members that tested positive for COVID and he may have been around [facility] Sunday.  History of AAA repair, CAD, heart failure, hypertension, diabetes, A. fib.  HPI     Past Medical History:  Diagnosis Date  . AAA (abdominal aortic aneurysm) ([place])    a. s/p repair 2009.  [name] Acid reflux    takes Prilosec and Protonix daily  . Arthritis    BacK  . CAD in native artery    a. a. prior inf MI 1993. b. PCI to LAD 05/1997. c. recurrent inferolateral MI complicated by V. fib arrest in 04/1998, prior PCI to OM1. d. 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  [name] Chronic combined systolic and diastolic CHF (congestive heart failure) ([place])   . Complication of anesthesia    -years ago hair fell out.;ileus after 2 of his surgeries  . Dry skin   . Foot drop, bilateral   . Hypercholesteremia   . Hypertension   . Ileus ([place])    After AAA  . Incisional hernia    "small, from AAA"  . Ischemic cardiomyopathy   . MI (myocardial infarction) ([place])   . 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 OSA on CPAP    uses CPAP--sleep study done at least 6yrs ago  . PAF (paroxysmal atrial fibrillation) ([place])   . Pain    back pain chronic- seen at pain clinic  . Plantar fasciitis    bilatetral  . Thrombocytopenia ([place])   . Type II diabetes mellitus [facility])     Patient Active Problem List   Diagnosis Date Noted  . Cellulitis of toe of right foot   . Diabetic foot infection ([place])  08/06/2020  . Osteomyelitis ([place]) 08/06/2020  . Acquired thrombophilia ([place]) 08/06/2020  . Ileus ([place]) 02/13/2020  . Acute colitis 02/08/2020  . Hypokalemia 07/08/2019  . Acute metabolic encephalopathy   . Orthostatic dizziness   . Postural dizziness with near syncope 07/04/2019  . Abdominal distention, non-gaseous 07/04/2019  . Change in mental status 07/04/2019  . Small bowel obstruction ([place]) 07/04/2019  . Postural dizziness with presyncope 05/09/2018  . Hypotension 05/08/2018  . On amiodarone therapy 08/26/2017  . Near syncope 06/11/2017  . Hypoxia   . Pneumonia of left lower lobe due to infectious organism   . Gastroenteritis and colitis, viral 04/27/2016  . Orthostasis 04/26/2016  . AKI (acute kidney injury) ([place]) 04/26/2016  . Lumbar radiculopathy, chronic 01/16/2016  . Hematoma of left kidney 11/01/2015  . Hyperlipidemia 12/12/2014  . SIRS (systemic inflammatory response syndrome) ([place]) 12/12/2014  . Influenza due to identified novel influenza A virus with other respiratory manifestations 11/07/2013  . Chronic anticoagulation 09/08/2013    Class: Chronic  . Coronary artery disease involving native coronary artery of native heart with angina pectoris ([place]) 08/16/2013    Class: Chronic  . Chronic combined systolic and diastolic heart failure ([place]) 08/16/2013  Class: Chronic  . Paroxysmal atrial fibrillation with RVR ([place]) 08/13/2013  . Paralytic ileus ([place]) 05/07/2012  . Nausea and vomiting 01/01/2012  . DM (diabetes mellitus), type 2, uncontrolled w/neurologic complication ([place]) 91/63/8466  . Hypertension   . AAA (abdominal aortic aneurysm) ([place])   . Acid reflux   . Sleep apnea     Past Surgical History:  Procedure Laterality Date  . ABDOMINAL AORTIC ANEURYSM REPAIR    . BACK SURGERY  2012-2013 X 3   Miminal Invasive x 3in [place].  [name] CARDIAC CATHETERIZATION  2009/2012  . CARDIOVERSION N/A 04/12/2015   Procedure: CARDIOVERSION;  Surgeon: Sueanne Margarita, MD;   Location: [facility];  Service: Cardiovascular;  Laterality: N/A;  . CARDIOVERSION N/A 12/26/2016   Procedure: CARDIOVERSION;  Surgeon: Sueanne Margarita, MD;  Location: [facility];  Service: Cardiovascular;  Laterality: N/A;  . COLONOSCOPY    . CORONARY ANGIOPLASTY WITH STENT PLACEMENT  1992; 1997  . LAPAROSCOPIC CHOLECYSTECTOMY    . LUMBAR LAMINECTOMY/DECOMPRESSION MICRODISCECTOMY Right 01/16/2016   Procedure: Right Lumbar five-Sacral one Laminectomy;  Surgeon: Kristeen Miss, MD;  Location: [facility]UR[facility]S;  Service: Neurosurgery;  Laterality: Right;  Right L5-S1 Laminectomy       Family History  Problem Relation Age of Onset  . Diabetes Mother   . Coronary artery disease Mother   . Diabetes Father   . Arthritis Father   . Rheum arthritis Father   . Coronary artery disease Father     Social History   Tobacco Use  . Smoking status: Former Smoker    Packs/day: 1.00    Years: 33.00    Pack years: 33.00    Types: Cigarettes    Quit date: 01/01/1992    Years since quitting: 29.1  . Smokeless tobacco: Never Used  Vaping Use  . Vaping Use: Never used  Substance Use Topics  . Alcohol use: Yes    Comment: 6/30;2017 "GLASS OF WINE Q COUPLE MONTHS, IF THAT"  . Drug use: No    Home Medications Prior to Admission medications   Medication Sig Start Date End Date Taking? Authorizing Provider  amiodarone (PACERONE) 200 MG tablet Take 1 tablet (200 mg total) by mouth daily. 03/31/20   Belva Crome, MD  amitriptyline (ELAVIL) 25 MG tablet Take 50 mg by mouth at bedtime.  12/11/18   [provider]  ascorbic acid ([employer] VITAMIN C) 500 MG tablet Take 500 mg by mouth daily.    [provider]  atorvastatin (LIPITOR) 10 MG tablet Take 10 mg by mouth daily.    [provider]  cephALEXin (KEFLEX) 500 MG capsule Take 1 capsule (500 mg total) by mouth 3 (three) times daily. 12/15/20   Lacretia Leigh, MD  cholecalciferol (VITAMIN D3) 25 MCG (1000 UNIT) tablet Take 1,000 Units  by mouth daily.    [provider]  Coenzyme Q10 (CO Q 10) 100 MG CAPS Take 100 mg by mouth daily.    [provider]  diazepam (VALIUM) 5 MG tablet Take 5 mg by mouth at bedtime as needed for anxiety.  10/31/15   [provider]  doxycycline (VIBRA-TABS) 100 MG tablet Take 1 tablet (100 mg total) by mouth 2 (two) times daily. 08/08/20   Newt Minion, MD  empagliflozin (JARDIANCE) 10 MG TABS tablet Take 10 mg by mouth daily with breakfast.     [provider]  furosemide (LASIX) 40 MG tablet Take 40 mg by mouth daily as needed for fluid.  01/11/20  [provider]  glimepiride (AMARYL) 2 MG tablet Take 2 mg by mouth every morning. 08/03/20   [provider]  metFORMIN (GLUCOPHAGE-XR) 500 MG 24 hr tablet Take 500-1,000 mg by mouth See admin instructions. 1,000 mg in the morning and 500 mg in the evening 10/12/15   [provider]  metoprolol tartrate (LOPRESSOR) 50 MG tablet Take 1 tablet by mouth twice daily 01/09/21   Belva Crome, MD  Multiple Vitamins-Minerals (ONE-A-DAY MENS 50+ ADVANTAGE) TABS Take 1 tablet by mouth daily.    [provider]  nitroGLYCERIN (NITRODUR - DOSED IN MG/24 HR) 0.2 mg/hr patch Place 1 patch (0.2 mg total) onto the skin daily. 08/29/20   Persons, Bevely Palmer, PA  Olopatadine HCl (PATADAY OP) Place 1 drop into both eyes daily.    [provider]  oxyCODONE-acetaminophen (PERCOCET) 7.5-325 MG tablet Take 1 tablet by mouth every 6 (six) hours as needed for moderate pain or severe pain.  07/20/20   [provider]  pentoxifylline (TRENTAL) 400 MG CR tablet Take 1 tablet (400 mg total) by mouth 3 (three) times daily with meals. 08/08/20   Newt Minion, MD  pregabalin (LYRICA) 200 MG capsule Take 200 mg by mouth at bedtime. 08/04/20   [provider]  pregabalin (LYRICA) 300 MG capsule Take 1 capsule by mouth 2 (two) times a day. 02/16/19   [provider]  rivaroxaban  (XARELTO) 20 MG TABS tablet Take 20 mg by mouth every evening.     [provider]  sulfamethoxazole-trimethoprim (BACTRIM DS) 800-160 MG tablet Take 1 tablet by mouth 2 (two) times daily. 02/05/21   Persons, Bevely Palmer, PA  trolamine salicylate (ASPERCREME) 10 % cream Apply 1 application topically as needed for muscle pain.    [provider]  vitamin B-12 (CYANOCOBALAMIN) 1000 MCG tablet Take 1,000 mcg by mouth daily.     [provider]    Allergies    Cymbalta [duloxetine hcl], Neurontin [gabapentin], and Keppra [levetiracetam]  Review of Systems   Review of Systems  Constitutional: Positive for chills, fatigue and fever.  HENT: Negative for ear pain and sore throat.   Eyes: Negative for pain and visual disturbance.  Respiratory: Positive for cough and shortness of breath.   Cardiovascular: Negative for chest pain and palpitations.  Gastrointestinal: Positive for nausea. Negative for abdominal pain and vomiting.  Genitourinary: Negative for dysuria and hematuria.  Musculoskeletal: Negative for arthralgias and back pain.  Skin: Negative for color change and rash.  Neurological: Negative for seizures and syncope.  All other systems reviewed and are negative.   Physical Exam Updated Vital Signs BP 114/64 (BP Location: Left Arm)   Pulse 80   Temp 99.9 F (37.7 C)   Resp 20   Ht 6' (1.829 m)   Wt 92 kg   SpO2 96%   BMI 27.51 kg/m   Physical Exam Vitals and nursing note reviewed.  Constitutional:      Appearance: He is well-developed.  HENT:     Head: Normocephalic and atraumatic.  Eyes:     Conjunctiva/sclera: Conjunctivae normal.  Cardiovascular:     Rate and Rhythm: Normal rate and regular rhythm.     Heart sounds: No murmur heard.   Pulmonary:     Comments: Slight tachypnea, no respiratory distress, 2L nasal cannula Abdominal:     Palpations: Abdomen is soft.     Tenderness: There is no abdominal tenderness.  Musculoskeletal:      Cervical back: Neck  supple.  Skin:    General: Skin is warm and dry.  Neurological:     General: No focal deficit present.     Mental Status: He is alert.  Psychiatric:        Mood and Affect: Mood normal.        Behavior: Behavior normal.     ED Results / Procedures / Treatments   Labs (all labs ordered are listed, but only abnormal results are displayed) Labs Reviewed  RESP PANEL BY RT-PCR (FLU A&B, COVID) ARPGX2  CULTURE, BLOOD (ROUTINE X 2)  CULTURE, BLOOD (ROUTINE X 2)  CBC WITH DIFFERENTIAL/PLATELET  COMPREHENSIVE METABOLIC PANEL  URINALYSIS, ROUTINE W REFLEX MICROSCOPIC  TROPONIN I (HIGH SENSITIVITY)    EKG EKG Interpretation  Date/Time:  Thursday February 15 2021 22:02:30 EDT Ventricular Rate:  82 PR Interval:  220 QRS Duration: 118 QT Interval:  386 QTC Calculation: 451 R Axis:   -34 Text Interpretation: Sinus rhythm Multiple ventricular premature complexes Prolonged PR interval Incomplete right bundle branch block Inferior infarct, old Consider anterior infarct Confirmed by Madalyn Rob [phone]) on 02/15/2021 10:11:34 PM   Radiology No results found.  Procedures .Critical Care Performed by: Lucrezia Starch, MD Authorized by: Lucrezia Starch, MD   Critical care provider statement:    Critical care time (minutes):  35   Critical care was time spent personally by me on the following activities:  Discussions with consultants, evaluation of patient's response to treatment, examination of patient, ordering and performing treatments and interventions, ordering and review of laboratory studies, ordering and review of radiographic studies, pulse oximetry, re-evaluation of patient's condition, obtaining history from patient or surrogate and review of old charts     Medications Ordered in ED Medications  ondansetron (ZOFRAN) injection 4 mg (has no administration in time range)    ED Course  I have reviewed the triage vital signs and the nursing  notes.  Pertinent labs & imaging results that were available during my care of the patient were reviewed by me and considered in my medical decision making (see chart for details).    MDM Rules/Calculators/A&P                         80 year old male presenting to ER with cough, chills, fever.  On physical exam, patient not in distress but is requiring 2 L nasal cannula.  Obtain broad work-up.  Concern for pneumonia versus COVID.  Will check COVID/flu, CXR and labs.  While awaiting work-up, signed out to oncoming team.  Anticipate likely admission.  Given high suspicion for COVID-19, deferred antibiotics and fluids.  Final Clinical Impression(s) / ED Diagnoses Final diagnoses:  Acute respiratory failure with hypoxia [facility])    Rx / DC Orders ED Discharge Orders    None       Lucrezia Starch, MD 02/15/21 2245

## 2021-02-16 ENCOUNTER — Encounter (HOSPITAL_COMMUNITY): Payer: Self-pay | Admitting: Emergency Medicine

## 2021-02-16 ENCOUNTER — Emergency Department (HOSPITAL_COMMUNITY): Payer: Medicare Other

## 2021-02-16 DIAGNOSIS — Z833 Family history of diabetes mellitus: Secondary | ICD-10-CM | POA: Diagnosis not present

## 2021-02-16 DIAGNOSIS — Z87891 Personal history of nicotine dependence: Secondary | ICD-10-CM | POA: Diagnosis not present

## 2021-02-16 DIAGNOSIS — E7849 Other hyperlipidemia: Secondary | ICD-10-CM | POA: Diagnosis not present

## 2021-02-16 DIAGNOSIS — Z8249 Family history of ischemic heart disease and other diseases of the circulatory system: Secondary | ICD-10-CM | POA: Diagnosis not present

## 2021-02-16 DIAGNOSIS — K219 Gastro-esophageal reflux disease without esophagitis: Secondary | ICD-10-CM | POA: Diagnosis present

## 2021-02-16 DIAGNOSIS — J9601 Acute respiratory failure with hypoxia: Secondary | ICD-10-CM

## 2021-02-16 DIAGNOSIS — I480 Paroxysmal atrial fibrillation: Secondary | ICD-10-CM | POA: Diagnosis present

## 2021-02-16 DIAGNOSIS — Z7901 Long term (current) use of anticoagulants: Secondary | ICD-10-CM | POA: Diagnosis not present

## 2021-02-16 DIAGNOSIS — E785 Hyperlipidemia, unspecified: Secondary | ICD-10-CM | POA: Diagnosis present

## 2021-02-16 DIAGNOSIS — R918 Other nonspecific abnormal finding of lung field: Secondary | ICD-10-CM | POA: Diagnosis present

## 2021-02-16 DIAGNOSIS — I714 Abdominal aortic aneurysm, without rupture: Secondary | ICD-10-CM | POA: Diagnosis present

## 2021-02-16 DIAGNOSIS — Z8261 Family history of arthritis: Secondary | ICD-10-CM | POA: Diagnosis not present

## 2021-02-16 DIAGNOSIS — G4733 Obstructive sleep apnea (adult) (pediatric): Secondary | ICD-10-CM | POA: Diagnosis present

## 2021-02-16 DIAGNOSIS — I2510 Atherosclerotic heart disease of native coronary artery without angina pectoris: Secondary | ICD-10-CM | POA: Diagnosis present

## 2021-02-16 DIAGNOSIS — J209 Acute bronchitis, unspecified: Secondary | ICD-10-CM | POA: Diagnosis present

## 2021-02-16 DIAGNOSIS — I5042 Chronic combined systolic (congestive) and diastolic (congestive) heart failure: Secondary | ICD-10-CM

## 2021-02-16 DIAGNOSIS — E1142 Type 2 diabetes mellitus with diabetic polyneuropathy: Secondary | ICD-10-CM | POA: Diagnosis present

## 2021-02-16 DIAGNOSIS — R0602 Shortness of breath: Secondary | ICD-10-CM | POA: Diagnosis present

## 2021-02-16 DIAGNOSIS — I255 Ischemic cardiomyopathy: Secondary | ICD-10-CM | POA: Diagnosis present

## 2021-02-16 DIAGNOSIS — Z794 Long term (current) use of insulin: Secondary | ICD-10-CM | POA: Diagnosis not present

## 2021-02-16 DIAGNOSIS — Z79899 Other long term (current) drug therapy: Secondary | ICD-10-CM | POA: Diagnosis not present

## 2021-02-16 DIAGNOSIS — I130 Hypertensive heart and chronic kidney disease with heart failure and stage 1 through stage 4 chronic kidney disease, or unspecified chronic kidney disease: Secondary | ICD-10-CM | POA: Diagnosis present

## 2021-02-16 DIAGNOSIS — Z20822 Contact with and (suspected) exposure to covid-19: Secondary | ICD-10-CM | POA: Diagnosis present

## 2021-02-16 DIAGNOSIS — N1831 Chronic kidney disease, stage 3a: Secondary | ICD-10-CM | POA: Diagnosis present

## 2021-02-16 DIAGNOSIS — I252 Old myocardial infarction: Secondary | ICD-10-CM | POA: Diagnosis not present

## 2021-02-16 DIAGNOSIS — E1122 Type 2 diabetes mellitus with diabetic chronic kidney disease: Secondary | ICD-10-CM | POA: Diagnosis present

## 2021-02-16 LAB — CBC WITH DIFFERENTIAL/PLATELET
Abs Immature Granulocytes: 0.1 10*3/uL — ABNORMAL HIGH (ref 0.00–0.07)
Basophils Absolute: 0 10*3/uL (ref 0.0–0.1)
Basophils Relative: 0 %
Eosinophils Absolute: 0 10*3/uL (ref 0.0–0.5)
Eosinophils Relative: 0 %
HCT: 36.4 % — ABNORMAL LOW (ref 39.0–52.0)
Hemoglobin: 12.2 g/dL — ABNORMAL LOW (ref 13.0–17.0)
Immature Granulocytes: 2 %
Lymphocytes Relative: 11 %
Lymphs Abs: 0.7 10*3/uL (ref 0.7–4.0)
MCH: 32.1 pg (ref 26.0–34.0)
MCHC: 33.5 g/dL (ref 30.0–36.0)
MCV: 95.8 fL (ref 80.0–100.0)
Monocytes Absolute: 1.7 10*3/uL — ABNORMAL HIGH (ref 0.1–1.0)
Monocytes Relative: 26 %
Neutro Abs: 4.1 10*3/uL (ref 1.7–7.7)
Neutrophils Relative %: 61 %
Platelets: 153 10*3/uL (ref 150–400)
RBC: 3.8 MIL/uL — ABNORMAL LOW (ref 4.22–5.81)
RDW: 15.2 % (ref 11.5–15.5)
WBC: 6.6 10*3/uL (ref 4.0–10.5)
nRBC: 0 % (ref 0.0–0.2)

## 2021-02-16 LAB — COMPREHENSIVE METABOLIC PANEL
ALT: 28 U/L (ref 0–44)
AST: 31 U/L (ref 15–41)
Albumin: 3.5 g/dL (ref 3.5–5.0)
Alkaline Phosphatase: 57 U/L (ref 38–126)
Anion gap: 9 (ref 5–15)
BUN: 23 mg/dL (ref 8–23)
CO2: 24 mmol/L (ref 22–32)
Calcium: 9.4 mg/dL (ref 8.9–10.3)
Chloride: 105 mmol/L (ref 98–111)
Creatinine, Ser: 1.52 mg/dL — ABNORMAL HIGH (ref 0.61–1.24)
GFR, Estimated: 46 mL/min — ABNORMAL LOW (ref >60)
Glucose, Bld: 159 mg/dL — ABNORMAL HIGH (ref 70–99)
Potassium: 4.2 mmol/L (ref 3.5–5.1)
Sodium: 138 mmol/L (ref 135–145)
Total Bilirubin: 0.6 mg/dL (ref 0.3–1.2)
Total Protein: 6.8 g/dL (ref 6.5–8.1)

## 2021-02-16 LAB — BLOOD GAS, VENOUS
Acid-base deficit: 0.2 mmol/L (ref 0.0–2.0)
Bicarbonate: 26.9 mmol/L (ref 20.0–28.0)
FIO2: 21
O2 Saturation: 41.6 %
Patient temperature: 98.6
pCO2, Ven: 58.5 mmHg (ref 44.0–60.0)
pH, Ven: 7.285 (ref 7.250–7.430)
pO2, Ven: <31 mmHg — CL (ref 32.0–45.0)

## 2021-02-16 LAB — GLUCOSE, CAPILLARY
Glucose-Capillary: 170 mg/dL — ABNORMAL HIGH (ref 70–99)
Glucose-Capillary: 142 mg/dL — ABNORMAL HIGH (ref 70–99)
Glucose-Capillary: 209 mg/dL — ABNORMAL HIGH (ref 70–99)

## 2021-02-16 LAB — TROPONIN I (HIGH SENSITIVITY)
Troponin I (High Sensitivity): 6 ng/L (ref <18)
Troponin I (High Sensitivity): 6 ng/L (ref <18)

## 2021-02-16 LAB — BRAIN NATRIURETIC PEPTIDE: B Natriuretic Peptide: 245.4 pg/mL — ABNORMAL HIGH (ref 0.0–100.0)

## 2021-02-16 LAB — PHOSPHORUS: Phosphorus: 3.7 mg/dL (ref 2.5–4.6)

## 2021-02-16 LAB — MAGNESIUM: Magnesium: 2.2 mg/dL (ref 1.7–2.4)

## 2021-02-16 LAB — CBG MONITORING, ED: Glucose-Capillary: 112 mg/dL — ABNORMAL HIGH (ref 70–99)

## 2021-02-16 MED ORDER — MUSCLE RUB 10-15 % EX CREA
1.0000 "application " | TOPICAL_CREAM | Freq: Every day | CUTANEOUS | Status: DC
  Filled 2021-02-16: qty 85

## 2021-02-16 MED ORDER — PENTOXIFYLLINE ER 400 MG PO TBCR
400.0000 mg | EXTENDED_RELEASE_TABLET | Freq: Three times a day (TID) | ORAL | Status: DC
  Administered 2021-02-16 – 2021-02-18 (×8): 400 mg via ORAL
  Filled 2021-02-16 (×9): qty 1

## 2021-02-16 MED ORDER — LIDOCAINE 5 % EX OINT
TOPICAL_OINTMENT | Freq: Three times a day (TID) | CUTANEOUS | Status: DC | PRN
  Administered 2021-02-16: 1 via TOPICAL
  Filled 2021-02-16: qty 35.44

## 2021-02-16 MED ORDER — AMITRIPTYLINE HCL 25 MG PO TABS
50.0000 mg | ORAL_TABLET | Freq: Every day | ORAL | Status: DC
  Administered 2021-02-16 – 2021-02-17 (×2): 50 mg via ORAL
  Filled 2021-02-16 (×2): qty 2

## 2021-02-16 MED ORDER — SODIUM CHLORIDE 0.9 % IV SOLN
500.0000 mg | INTRAVENOUS | Status: DC
  Administered 2021-02-16 – 2021-02-18 (×3): 500 mg via INTRAVENOUS
  Filled 2021-02-16 (×3): qty 500

## 2021-02-16 MED ORDER — PREGABALIN 75 MG PO CAPS
200.0000 mg | ORAL_CAPSULE | Freq: Every day | ORAL | Status: DC
  Administered 2021-02-16: 200 mg via ORAL

## 2021-02-16 MED ORDER — PREGABALIN 75 MG PO CAPS: 300.0000 mg | ORAL_CAPSULE | Freq: Two times a day (BID) | ORAL | Status: DC

## 2021-02-16 MED ORDER — OLOPATADINE HCL 0.1 % OP SOLN
1.0000 [drp] | Freq: Two times a day (BID) | OPHTHALMIC | Status: DC
  Administered 2021-02-16 – 2021-02-18 (×5): 1 [drp] via OPHTHALMIC
  Filled 2021-02-16: qty 5

## 2021-02-16 MED ORDER — ACETAMINOPHEN 325 MG PO TABS
650.0000 mg | ORAL_TABLET | Freq: Four times a day (QID) | ORAL | Status: DC | PRN
  Filled 2021-02-16: qty 2

## 2021-02-16 MED ORDER — PREGABALIN 50 MG PO CAPS
200.0000 mg | ORAL_CAPSULE | Freq: Every day | ORAL | Status: DC
  Filled 2021-02-16: qty 4

## 2021-02-16 MED ORDER — POLYVINYL ALCOHOL 1.4 % OP SOLN
Freq: Four times a day (QID) | OPHTHALMIC | Status: DC
  Administered 2021-02-16: 2 [drp] via OPHTHALMIC
  Filled 2021-02-16: qty 15

## 2021-02-16 MED ORDER — ACETAMINOPHEN 325 MG PO TABS: 650.0000 mg | ORAL_TABLET | Freq: Four times a day (QID) | ORAL | Status: DC | PRN

## 2021-02-16 MED ORDER — IPRATROPIUM-ALBUTEROL 20-100 MCG/ACT IN AERS
1.0000 | INHALATION_SPRAY | Freq: Three times a day (TID) | RESPIRATORY_TRACT | Status: DC
  Administered 2021-02-17 – 2021-02-18 (×4): 1 via RESPIRATORY_TRACT
  Filled 2021-02-16: qty 4

## 2021-02-16 MED ORDER — ONDANSETRON HCL 4 MG/2ML IJ SOLN
4.0000 mg | Freq: Three times a day (TID) | INTRAMUSCULAR | Status: DC | PRN
  Administered 2021-02-16: 4 mg via INTRAVENOUS
  Filled 2021-02-16: qty 2

## 2021-02-16 MED ORDER — INSULIN ASPART 100 UNIT/ML ~~LOC~~ SOLN
0.0000 [IU] | Freq: Three times a day (TID) | SUBCUTANEOUS | Status: DC
  Administered 2021-02-16: 1 [IU] via SUBCUTANEOUS
  Administered 2021-02-16: 2 [IU] via SUBCUTANEOUS
  Administered 2021-02-17: 3 [IU] via SUBCUTANEOUS
  Administered 2021-02-17 – 2021-02-18 (×3): 2 [IU] via SUBCUTANEOUS
  Filled 2021-02-16: qty 0.09

## 2021-02-16 MED ORDER — IPRATROPIUM-ALBUTEROL 20-100 MCG/ACT IN AERS
1.0000 | INHALATION_SPRAY | Freq: Four times a day (QID) | RESPIRATORY_TRACT | Status: DC
  Administered 2021-02-16 (×4): 1 via RESPIRATORY_TRACT
  Filled 2021-02-16: qty 4

## 2021-02-16 MED ORDER — ATORVASTATIN CALCIUM 10 MG PO TABS
10.0000 mg | ORAL_TABLET | Freq: Every day | ORAL | Status: DC
  Administered 2021-02-16 – 2021-02-18 (×3): 10 mg via ORAL
  Filled 2021-02-16 (×3): qty 1

## 2021-02-16 MED ORDER — RIVAROXABAN 20 MG PO TABS
20.0000 mg | ORAL_TABLET | Freq: Every evening | ORAL | Status: DC
  Administered 2021-02-16 – 2021-02-17 (×2): 20 mg via ORAL
  Filled 2021-02-16 (×2): qty 1

## 2021-02-16 MED ORDER — ACETAMINOPHEN 650 MG RE SUPP: 650.0000 mg | Freq: Four times a day (QID) | RECTAL | Status: DC | PRN

## 2021-02-16 MED ORDER — ALBUTEROL SULFATE HFA 108 (90 BASE) MCG/ACT IN AERS
1.0000 | INHALATION_SPRAY | RESPIRATORY_TRACT | Status: DC | PRN
  Filled 2021-02-16: qty 6.7

## 2021-02-16 MED ORDER — DIAZEPAM 5 MG PO TABS
5.0000 mg | ORAL_TABLET | Freq: Every evening | ORAL | Status: DC | PRN
  Administered 2021-02-16 – 2021-02-17 (×2): 5 mg via ORAL
  Filled 2021-02-16 (×2): qty 1

## 2021-02-16 MED ORDER — LIDOCAINE 5 % EX PTCH
1.0000 | MEDICATED_PATCH | Freq: Every day | CUTANEOUS | Status: DC | PRN
  Administered 2021-02-16: 1 via TRANSDERMAL
  Filled 2021-02-16 (×4): qty 1

## 2021-02-16 MED ORDER — MUSCLE RUB 10-15 % EX CREA
TOPICAL_CREAM | Freq: Every day | CUTANEOUS | Status: DC
  Filled 2021-02-16: qty 85

## 2021-02-16 MED ORDER — PREGABALIN 75 MG PO CAPS
300.0000 mg | ORAL_CAPSULE | Freq: Every day | ORAL | Status: DC
  Administered 2021-02-16: 300 mg via ORAL
  Filled 2021-02-16: qty 4

## 2021-02-16 MED ORDER — AMIODARONE HCL 200 MG PO TABS
200.0000 mg | ORAL_TABLET | Freq: Every day | ORAL | Status: DC
  Administered 2021-02-16 – 2021-02-18 (×3): 200 mg via ORAL
  Filled 2021-02-16 (×4): qty 1

## 2021-02-16 MED ORDER — PREGABALIN 75 MG PO CAPS
200.0000 mg | ORAL_CAPSULE | Freq: Two times a day (BID) | ORAL | Status: DC
  Administered 2021-02-16 – 2021-02-18 (×4): 200 mg via ORAL
  Filled 2021-02-16 (×4): qty 1

## 2021-02-16 MED ORDER — VITAMIN B-12 1000 MCG PO TABS
5000.0000 ug | ORAL_TABLET | Freq: Every day | ORAL | Status: DC
  Administered 2021-02-17 – 2021-02-18 (×2): 5000 ug via ORAL
  Filled 2021-02-16 (×2): qty 5

## 2021-02-16 MED ORDER — SODIUM CHLORIDE 0.9 % IV SOLN: 1.0000 g | Freq: Once | INTRAVENOUS | Status: DC

## 2021-02-16 MED ORDER — FAMOTIDINE 20 MG PO TABS
20.0000 mg | ORAL_TABLET | Freq: Every day | ORAL | Status: DC
  Administered 2021-02-16 – 2021-02-18 (×3): 20 mg via ORAL
  Filled 2021-02-16 (×3): qty 1

## 2021-02-16 MED ORDER — METOPROLOL TARTRATE 50 MG PO TABS
50.0000 mg | ORAL_TABLET | Freq: Two times a day (BID) | ORAL | Status: DC
  Administered 2021-02-16 – 2021-02-18 (×5): 50 mg via ORAL
  Filled 2021-02-16 (×5): qty 1

## 2021-02-16 MED ORDER — IOHEXOL 350 MG/ML SOLN
100.0000 mL | Freq: Once | INTRAVENOUS | Status: AC | PRN
  Administered 2021-02-16: 100 mL via INTRAVENOUS

## 2021-02-16 MED ORDER — OXYCODONE-ACETAMINOPHEN 7.5-325 MG PO TABS
1.0000 | ORAL_TABLET | Freq: Four times a day (QID) | ORAL | Status: DC | PRN
  Administered 2021-02-16 (×3): 1 via ORAL
  Filled 2021-02-16 (×3): qty 1

## 2021-02-16 MED ORDER — FUROSEMIDE 40 MG PO TABS
40.0000 mg | ORAL_TABLET | Freq: Every day | ORAL | Status: DC
  Administered 2021-02-16 – 2021-02-18 (×3): 40 mg via ORAL
  Filled 2021-02-16 (×3): qty 1

## 2021-02-16 NOTE — Progress Notes (Signed)
Same day note  Patient seen and examined at bedside.  Patient was admitted to the hospital for shortness of breath  At the time of my evaluation, patient complains of burning sensation in the legs.  Breathing has slightly improved.  Classically from cough.  Physical examination reveals elderly male on nasal cannula oxygen, diminished breath sounds bilaterally.  Laboratory data and imaging was reviewed  Assessment and Plan.  Acute hypoxic respiratory distress due to acute bronchitis: Possibility of undiagnosed COPD.  Continue supplemental oxygen.  Patient was hypoxic on presentation with pulse ox of 88% on room air initially.  CTA was chest showed no evidence of pulmonary embolism, infiltrate edema.  Continue azithromycin.  BNP elevated at 245.  VBG showed pH of 7.2 with PCO2 of 58.  Wean as tolerated.  Continue with albuterol inhaler.  Type 2 diabetes mellitus with peripheral neuropathy.   On metformin, Jardiance, and glimepiride at home.  Continue sliding scale insulin Accu-Cheks diabetic diet.  Continue Lyrica, renally dosed.  Paroxysmal atrial fibrillation:  CHA2DS2-VASc score of 6, t patient is on Xarelto at home.  Continue metoprolol.  EKG on presentation showed sinus rhythm.  Continue amiodarone.   Chronic systolic/diastolic heart failure: Chest x-ray without any congestion.  On oral Lasix.  Continue beta-blockers.  Hyperlipidemia: On atorvastatin as an outpatient.  Plan: Continue on statin.  Stage IIIa chronic kidney disease:  Baseline creatinine of around 1.2-1.5.  We will continue to monitor.  Patient seems to be unaware about this  Spoke with the patient's spouse at bedside.  No Charge  Signed,  Delila Pereyra, MD [facility]

## 2021-02-16 NOTE — H&P (Signed)
History and Physical    PLEASE NOTE THAT DRAGON DICTATION SOFTWARE WAS USED IN THE CONSTRUCTION OF THIS NOTE.   [name] HCW:[id number] DOB: 1941-02-23 DOA: 02/15/2021  PCP: [name], MD Patient coming from: home   I have personally briefly reviewed patient's old medical records in [place]  Chief Complaint: Shortness of breath  HPI: [name] is a 80 y.o. 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   The patient reports to 3 days of progressive shortness of breath associated with new onset nonproductive cough and subjective fever.  He denies any associated orthopnea, PND, or new onset peripheral edema.  He also denies any associated hemoptysis, calf tenderness, or lower extremity erythema.  No recent trauma or travel.  Denies any associated chills, rigors, or generalized myalgias.  He denies any recent headache, neck stiffness, sore throat, wheezing, nausea, vomiting, abdominal pain, diarrhea, or rash.  He does however report 1 to 2 days of rhinitis/rhinorrhea as well as postnasal drip prior to development of his cough and shortness of breath.  Additionally, he conveys that he spent most of Saturday, 02/10/2021, with a friend who subsequently tested positive for COVID-19.  Denies any recent dysuria, gross hematuria, or change in urinary urgency/frequency.  Denies any chest pain, diaphoresis, or palpitations.  Of note, the patient has a history of chronic combined systolic/diastolic heart failure, with most  recent echocardiogram in July 2019, which showed mildly dilated left ventricle, mild LVH, LVEF 40 to 45%, hypokinesis of the inferior lateral and inferior myocardium, grade 1 diastolic dysfunction, mild to moderately dilated left atrium, and no significant valvular pathology.  He is on as needed Lasix as a sole outpatient diuretic medication.  He also has history of paroxysmal atrial fibrillation for which she is chronically anticoagulated on Xarelto.  Additionally he is on amiodarone and Lopressor at home.     ED Course:  Vital signs in the ED were notable for the following:  - Temperature max 99.9; heart rate 70-80; blood pressure 102/62 121/59; respiratory rate 18-20; oxygen saturation initially noted to be 88% on room air, which improved to 94% on 2 L nasal cannula.  This is in the setting of no known baseline supplemental oxygen requirements.  Labs were notable for the following: CMP was notable for the following: Bicarbonate 23, creatinine 1.50 relative to most recent prior creatinine data point of 1.42 in February 2022, liver enzymes were found to be within normal limits.  High-sensitivity troponin I x2 were found to be 5 followed by value of 6.  CBC notable for white blood cell count of 5900.  Urinalysis showed no evidence of white blood cells.  Nasopharyngeal COVID-19 PCR was performed in ED today as well as influenza PCR, and were both found to be negative.  Blood cultures x2 were collected.   EKG showed sinus rhythm with 2 PVCs, prolonged PR of 220, nonspecific T wave inversion in V1/V2, and no evidence of ST changes, including no evidence of ST elevation.  Chest x-ray showed no evidence of acute cardiopulmonary process.  CT of the chest showed no evidence of acute pulmonary embolism as well  as no evidence of infiltrate, edema, effusion, or pneumothorax.  CT of the chest did show a spiculated soft tissue lesion in the left lower lobe, with further chart review revealing documentation of a known  lung mass.  Subsequently, the patient was admitted to the med telemetry floor for further evaluation and management of acute hypoxic respiratory distress due to suspected acute bronchitis.     Review of Systems: As per HPI otherwise 10 point review of systems negative.   Past Medical History:  Diagnosis Date  . AAA (abdominal aortic aneurysm) ([place])    a. s/p repair 2009.  [name] Acid reflux    takes Prilosec and Protonix daily  . Arthritis    BacK  . CAD in native artery    a. a. prior inf MI 1993. b. PCI to LAD 05/1997. c. recurrent inferolateral MI complicated by V. fib arrest in 04/1998, prior PCI to OM1. d. 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  [name] Chronic combined systolic and diastolic CHF (congestive heart failure) ([place])   . Complication of anesthesia    -years ago hair fell out.;ileus after 2 of his surgeries  . Dry skin   . Foot drop, bilateral   . Hypercholesteremia   . Hypertension   . Ileus ([place])    After AAA  . Incisional hernia    "small, from AAA"  . Ischemic cardiomyopathy   . MI (myocardial infarction) ([place])   . 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 OSA on CPAP    uses CPAP--sleep study done at least 65yrs ago  . PAF (paroxysmal atrial fibrillation) ([place])   . Pain    back pain chronic- seen at pain clinic  . Plantar fasciitis    bilatetral  . Thrombocytopenia ([place])   . Type II diabetes mellitus ([place])     Past Surgical History:  Procedure Laterality Date  . ABDOMINAL AORTIC ANEURYSM REPAIR    . BACK SURGERY  2012-2013 X 3   Miminal Invasive x 3in [place].  [name] CARDIAC CATHETERIZATION  2009/2012  . CARDIOVERSION N/A 04/12/2015   Procedure: CARDIOVERSION;  Surgeon: Sueanne Margarita, MD;  Location: [place];  Service: Cardiovascular;  Laterality: N/A;  . CARDIOVERSION N/A 12/26/2016   Procedure: CARDIOVERSION;  Surgeon: Sueanne Margarita, MD;  Location: [facility];  Service: Cardiovascular;  Laterality: N/A;  . COLONOSCOPY    . CORONARY ANGIOPLASTY WITH STENT PLACEMENT   1992; 1997  . LAPAROSCOPIC CHOLECYSTECTOMY    . LUMBAR LAMINECTOMY/DECOMPRESSION MICRODISCECTOMY Right 01/16/2016   Procedure: Right Lumbar five-Sacral one Laminectomy;  Surgeon: Kristeen Miss, MD;  Location: [place] [facility];  Service: Neurosurgery;  Laterality: Right;  Right L5-S1 Laminectomy    Social History:  reports that he quit smoking about 29 years ago. His smoking use included cigarettes. He has a 33.00 pack-year smoking history. He has never used smokeless tobacco. He reports current alcohol use. He reports that he does not use drugs.   Allergies  Allergen Reactions  . Cymbalta [Duloxetine Hcl] Nausea And Vomiting and Other (See Comments)    PATIENT STATED THAT HE FELT LIKE HE "WAS GOING TO DIE" Rapid drop in blood pressure; sent him to the ER X2  . Neurontin [Gabapentin] Other (See Comments)    "Makes me goofy, keeps me off balance, clouds my thinking.."  . Keppra [Levetiracetam] Nausea And Vomiting    Family History  Problem Relation Age of Onset  . Diabetes Mother   . Coronary artery disease Mother   .  Diabetes Father   . Arthritis Father   . Rheum arthritis Father   . Coronary artery disease Father      Prior to Admission medications   Medication Sig Start Date End Date Taking? Authorizing Provider  amiodarone (PACERONE) 200 MG tablet Take 1 tablet (200 mg total) by mouth daily. 03/31/20   Belva Crome, MD  amitriptyline (ELAVIL) 25 MG tablet Take 50 mg by mouth at bedtime.  12/11/18   [provider]  ascorbic acid ([employer] VITAMIN C) 500 MG tablet Take 500 mg by mouth daily.    [provider]  atorvastatin (LIPITOR) 10 MG tablet Take 10 mg by mouth daily.    [provider]  cephALEXin (KEFLEX) 500 MG capsule Take 1 capsule (500 mg total) by mouth 3 (three) times daily. 12/15/20   Lacretia Leigh, MD  cholecalciferol (VITAMIN D3) 25 MCG (1000 UNIT) tablet Take 1,000 Units by mouth daily.    [provider]  Coenzyme Q10 (CO Q 10) 100 MG  CAPS Take 100 mg by mouth daily.    [provider]  diazepam (VALIUM) 5 MG tablet Take 5 mg by mouth at bedtime as needed for anxiety.  10/31/15   [provider]  doxycycline (VIBRA-TABS) 100 MG tablet Take 1 tablet (100 mg total) by mouth 2 (two) times daily. 08/08/20   Newt Minion, MD  empagliflozin (JARDIANCE) 10 MG TABS tablet Take 10 mg by mouth daily with breakfast.     [provider]  furosemide (LASIX) 40 MG tablet Take 40 mg by mouth daily as needed for fluid.  01/11/20   [provider]  glimepiride (AMARYL) 2 MG tablet Take 2 mg by mouth every morning. 08/03/20   [provider]  metFORMIN (GLUCOPHAGE-XR) 500 MG 24 hr tablet Take 500-1,000 mg by mouth See admin instructions. 1,000 mg in the morning and 500 mg in the evening 10/12/15   [provider]  metoprolol tartrate (LOPRESSOR) 50 MG tablet Take 1 tablet by mouth twice daily 01/09/21   Belva Crome, MD  Multiple Vitamins-Minerals (ONE-A-DAY MENS 50+ ADVANTAGE) TABS Take 1 tablet by mouth daily.    [provider]  nitroGLYCERIN (NITRODUR - DOSED IN MG/24 HR) 0.2 mg/hr patch Place 1 patch (0.2 mg total) onto the skin daily. 08/29/20   Persons, Bevely Palmer, PA  Olopatadine HCl (PATADAY OP) Place 1 drop into both eyes daily.    [provider]  oxyCODONE-acetaminophen (PERCOCET) 7.5-325 MG tablet Take 1 tablet by mouth every 6 (six) hours as needed for moderate pain or severe pain.  07/20/20   [provider]  pentoxifylline (TRENTAL) 400 MG CR tablet Take 1 tablet (400 mg total) by mouth 3 (three) times daily with meals. 08/08/20   Newt Minion, MD  pregabalin (LYRICA) 200 MG capsule Take 200 mg by mouth at bedtime. 08/04/20   [provider]  pregabalin (LYRICA) 300 MG capsule Take 1 capsule by mouth 2 (two) times a day. 02/16/19   [provider]  rivaroxaban (XARELTO) 20 MG TABS tablet Take 20 mg by mouth every evening.     [provider]  sulfamethoxazole-trimethoprim (BACTRIM DS) 800-160 MG tablet Take 1 tablet by mouth 2 (two) times daily. 02/05/21   Persons, Bevely Palmer, PA  trolamine salicylate (ASPERCREME) 10 % cream Apply 1 application topically as needed for muscle pain.    [provider]  vitamin B-12 (CYANOCOBALAMIN) 1000 MCG tablet Take 1,000 mcg by mouth daily.  [provider]     Objective    Physical Exam: Vitals:   02/15/21 2315 02/16/21 0000 02/16/21 0015 02/16/21 0121  BP: (!) 121/59 102/62 (!) 115/59 116/65  Pulse: 72 77 69 70  Resp: $Remo'18  18 18  'zqLEv$ Temp:      SpO2: 95% 96% 94% 96%  Weight:      Height:        General: appears to be stated age; alert, oriented; mildly increased work of breathing Skin: warm, dry, no rash Head:  AT/[place] Mouth:  Oral mucosa membranes appear moist, normal dentition Neck: supple; trachea midline Heart:  RRR; did not appreciate any M/R/G Lungs: CTAB, did not appreciate any wheezes, rales, or rhonchi Abdomen: + BS; soft, ND, NT Vascular: 2+ pedal pulses b/l; 2+ radial pulses b/l Extremities: no peripheral edema, no muscle wasting Neuro: strength and sensation intact in upper and lower extremities b/l    Labs on Admission: I have personally reviewed following labs and imaging studies  CBC: Recent Labs  Lab 02/15/21 2215  WBC 5.9  NEUTROABS 4.2  HGB 12.0*  HCT 35.5*  MCV 94.7  PLT 062   Basic Metabolic Panel: Recent Labs  Lab 02/15/21 2215  NA 133*  K 4.6  CL 102  CO2 23  GLUCOSE 119*  BUN 23  CREATININE 1.50*  CALCIUM 9.1   GFR: Estimated Creatinine Clearance: 42.4 mL/min (A) (by C-G formula based on SCr of 1.5 mg/dL (H)). Liver Function Tests: Recent Labs  Lab 02/15/21 2215  AST 27  ALT 25  ALKPHOS 61  BILITOT 0.8  PROT 7.1  ALBUMIN 3.6   No results for input(s): LIPASE, AMYLASE in the last 168 hours. No results for input(s): AMMONIA in the last 168 hours. Coagulation Profile: No results for input(s):  INR, PROTIME in the last 168 hours. Cardiac Enzymes: No results for input(s): CKTOTAL, CKMB, CKMBINDEX, TROPONINI in the last 168 hours. BNP (last 3 results) No results for input(s): PROBNP in the last 8760 hours. HbA1C: No results for input(s): HGBA1C in the last 72 hours. CBG: No results for input(s): GLUCAP in the last 168 hours. Lipid Profile: No results for input(s): CHOL, HDL, LDLCALC, TRIG, CHOLHDL, LDLDIRECT in the last 72 hours. Thyroid Function Tests: No results for input(s): TSH, T4TOTAL, FREET4, T3FREE, THYROIDAB in the last 72 hours. Anemia Panel: No results for input(s): VITAMINB12, FOLATE, FERRITIN, TIBC, IRON, RETICCTPCT in the last 72 hours. Urine analysis:    Component Value Date/Time   COLORURINE YELLOW 02/15/2021 2204   APPEARANCEUR HAZY (A) 02/15/2021 2204   LABSPEC 1.021 02/15/2021 2204   PHURINE 5.0 02/15/2021 2204   GLUCOSEU >=500 (A) 02/15/2021 2204   HGBUR LARGE (A) 02/15/2021 2204   BILIRUBINUR NEGATIVE 02/15/2021 2204   KETONESUR NEGATIVE 02/15/2021 2204   PROTEINUR 30 (A) 02/15/2021 2204   UROBILINOGEN 0.2 12/12/2014 0150   NITRITE NEGATIVE 02/15/2021 2204   LEUKOCYTESUR NEGATIVE 02/15/2021 2204    Radiological Exams on Admission: CT Angio Chest PE W and/or Wo Contrast  Result Date: 02/16/2021 CLINICAL DATA:  Chest pain and shortness of breath, possible COVID-19 positivity EXAM: CT ANGIOGRAPHY CHEST WITH CONTRAST TECHNIQUE: Multidetector CT imaging of the chest was performed using the standard protocol during bolus administration of intravenous contrast. Multiplanar CT image reconstructions and MIPs were obtained to evaluate the vascular anatomy. CONTRAST:  165mL OMNIPAQUE IOHEXOL 350 MG/ML SOLN COMPARISON:  08/08/2011 FINDINGS: Cardiovascular: Thoracic aorta demonstrates atherosclerotic calcifications without aneurysmal dilatation or dissection. Coronary calcifications are seen. Heart is at  the upper limits of normal in size. Pulmonary artery shows a  normal branching pattern bilaterally. No focal filling defects are identified to suggest pulmonary emboli. Mediastinum/Nodes: Thoracic inlet is within normal limits. No sizable hilar or mediastinal adenopathy is noted. The esophagus as visualized limits. Lungs/Pleura: Emphysematous changes are noted. Right lung is well aerated without focal infiltrate or sizable effusion. The left lung demonstrates a small nodular density in the lower lobe along the diaphragm best seen on image number 121 of series 10. This is smaller in size than that seen on the prior exam and felt to be benign in etiology given its long-term presence. In the superior segment of the left lower lobe however there is a somewhat spiculated appearing soft tissue lesion which measures approximately 2.9 x 1.9 cm. It abuts the pulmonary artery in this region and causes mild contour deformity. This is best appreciated on image number 65 series 10. 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 Partially calcified pleural plaque is noted along the superior segment of the right lower stable in appearance from the prior exam. Upper Abdomen: Visualized upper abdomen shows no acute abnormality. Musculoskeletal: Degenerative changes of the thoracic spine are noted. No rib abnormality is noted. Review of the MIP images confirms the above findings. IMPRESSION: Spiculated appearing soft tissue mass lesion in the superior segment of the left lower lobe as described above. This causes some contour deformity on the pulmonary artery as described above. 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 Bronchoscopic evaluation may be helpful. No evidence of pulmonary emboli. Slightly smaller nodule in the left lower lobe when compared with the prior exam from 20/12 consistent with a benign etiology. Aortic Atherosclerosis (ICD10-I70.0) and Emphysema (ICD10-J43.9). Electronically Signed   By: Inez Catalina  M.D.   On: 02/16/2021 01:03   DG Chest Portable 1 View  Result Date: 02/15/2021 CLINICAL DATA:  Cough, fever, hypoxia EXAM: PORTABLE CHEST 1 VIEW COMPARISON:  12/15/2020 FINDINGS: Heart and mediastinal contours are within normal limits. No focal opacities or effusions. No acute bony abnormality. IMPRESSION: No active disease. Electronically Signed   By: Rolm Baptise M.D.   On: 02/15/2021 22:49     EKG: Independently reviewed, with result as described above.    Assessment/Plan   [name] is a 80 y.o. 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     Principal Problem:   Acute bronchitis Active Problems:   Acid reflux   Chronic combined systolic and diastolic heart failure (HCC)   Hyperlipidemia   SOB (shortness of breath)   PAF (paroxysmal atrial fibrillation) (H[facility])    #) Acute hypoxic respiratory distress due to acute bronchitis: Diagnosis on the basis of 2 3 days progressive shortness of breath associated with new onset nonproductive cough, subjective fever, preceding rhinitis/rhinorrhea, and no evidence of acute cardiopulmonary process on either chest x-ray or CTA chest.  Therefore, in the context of no established diagnosis of underlying COPD, it appears the presentation is consistent with acute bronchitis, most likely on the basis of viral etiology, particularly given preceding rhinitis/rhinorrhea.  It is possible that the  patient may have undiagnosed COPD given his greater than 30-pack-year history, however,  will initially proceed with additional evaluation management of acute bronchitis without systemic corticosteroids in the absence of current evidence for bronchospasm.  Presenting oxygen saturation noted to be 88% on room air, which improved to 94% on 2 L nasal cannula.  This is in the context of no known baseline supplemental oxygen requirements.  Consequently, criteria are met for acute hypoxic respiratory distress as opposed to failure.  As further described above, CTA of the chest showed no evidence of acute pulmonary embolism, infiltrate, edema, effusion, pneumothorax.  Additionally, nasopharyngeal COVID-19 PCR/influenza PCR performed in the ED today were found to be negative.  ACS also appears less likely, given the setting of no recent chest discomfort, as well as 2 nonelevated high-sensitivity troponin values in the ED today.  Plan: Scheduled Combivent every 6 hours.  As needed albuterol inhaler.  Azithromycin for anti-inflammatory benefits.  Monitor on telemetry.  Monitor continuous pulse oximetry.  Add on serum magnesium level and check serum phosphorus level.  Check VBG in the context of a long smoking history.  Add on BNP.  While ACS appears to be less likely, will check a third serial troponin value in the morning.     #) Type 2 diabetes mellitus: Complicated by peripheral polyneuropathy on Lyrica.  He is not on exogenous insulin at home.  Rather he is on the following regimen: Metformin, Jardiance, and glimepiride.  Plan: Hold home glimepiride, Jardiance, and metformin during this hospitalization.  Ordered Accu-Cheks before every meal and at bedtime with low-dose sliding scale insulin.  Continue Lyrica.       #) Paroxysmal atrial fibrillation: Documented history of such. In the setting of a CHA2DS2-VASc score of 6, there is an indication for the patient to be on chronic anticoagulation for thromboembolic prophylaxis. Consistent with this, the patient is chronically anticoagulated on  Xarelto. Home AV nodal blocking regimen: Lopressor.  Additionally, a rhythm control strategy is also pursued as an outpatient, with the patient on daily oral amiodarone.  Most recent echocardiogram was performed in July 2019, as further detailed above. Presenting EKG shows sinus rhythm, as further detailed above.   Plan: monitor strict I's & O's and daily weights. Repeat BMP and CBC in the morning. Check serum magnesium level. Continue home AV nodal blocking regimen, as above.  Continue amiodarone and Xarelto.       #) Chronic systolic/diastolic heart failure: Documented history of such, with most recent echocardiogram in July 2019, as further detailed above.  On as needed Lasix as sole outpatient diuretic medication.  No clinical or radiographic evidence to suggest acutely decompensated heart failure at this time.  Monitor strict I's and O's and daily weights.  Add on serum magnesium level.  Repeat BMP in the morning.  Continue home beta-blocker.      #) Hyperlipidemia: On atorvastatin as an outpatient.  Plan: Continue on statin.        #) Stage IIIa chronic kidney disease: Associated with baseline creatinine range of 1.2-1.5.  Presenting labs reflect serum creatinine within this range.  Plan: Monitor strict I's and O's and daily weights.  Tempt avoid nephrotoxic agents.  Repeat BMP in the morning.      DVT prophylaxis: Continue home Xarelto Code Status: Full code Family Communication: none Disposition Plan: Per Rounding Team Consults called: none  Admission status: Inpatient; med telemetry.     Of note, this patient was added by me to the following Admit List/Treatment Team:  wladmits.      PLEASE NOTE THAT DRAGON DICTATION SOFTWARE WAS USED IN THE CONSTRUCTION OF THIS NOTE.   Rhetta Mura DO [facility] Pager [phone] From [place]   02/16/2021, 1:48 AM

## 2021-02-16 NOTE — ED Notes (Signed)
Patient transported to X-ray 

## 2021-02-17 DIAGNOSIS — K219 Gastro-esophageal reflux disease without esophagitis: Secondary | ICD-10-CM

## 2021-02-17 DIAGNOSIS — J209 Acute bronchitis, unspecified: Secondary | ICD-10-CM | POA: Diagnosis not present

## 2021-02-17 DIAGNOSIS — E7849 Other hyperlipidemia: Secondary | ICD-10-CM | POA: Diagnosis not present

## 2021-02-17 DIAGNOSIS — I5042 Chronic combined systolic (congestive) and diastolic (congestive) heart failure: Secondary | ICD-10-CM | POA: Diagnosis not present

## 2021-02-17 LAB — GLUCOSE, CAPILLARY
Glucose-Capillary: 111 mg/dL — ABNORMAL HIGH (ref 70–99)
Glucose-Capillary: 223 mg/dL — ABNORMAL HIGH (ref 70–99)
Glucose-Capillary: 169 mg/dL — ABNORMAL HIGH (ref 70–99)
Glucose-Capillary: 188 mg/dL — ABNORMAL HIGH (ref 70–99)

## 2021-02-17 NOTE — Progress Notes (Signed)
PROGRESS NOTE  [name] UJW:[id number] DOB: 03/21/1941 DOA: 02/15/2021 PCP: [name], MD   LOS: 1 day   Brief narrative:  [name] is a 80 y.o. male with medical history of lung mass, type 2 diabetes mellitus with polyneuropathy, paroxysmal atrial fibrillation on Xarelto, CHF, hyperlipidemia, stage III CKD was admitted to hospital with cough and respiratory distress and need for new oxygen supplementation.  He also complained of generalized weakness and fatigue.  In the ED, patient had a temperature of 99.9, oxygen saturation initially noted to be 88% on room air, which improved to 94% on 2 L nasal cannula.    No leukocytosis.  UA showed some white cells.  EKG showed sinus rhythm. Chest x-ray showed no evidence of acute cardiopulmonary process.  CT of the chest showed no evidence of acute pulmonary embolism as well as no evidence of infiltrate, edema, effusion, or pneumothorax.  CT of the chest did show a spiculated soft tissue lesion in the left lower lobe, with further chart review revealing documentation of a known lung mass.  Patient was then admitted hospital for further evaluation and treatment of acute hypoxic respiratory failure with underlying suspected bronchitis.  Assessment/Plan:  Principal Problem:   Acute bronchitis Active Problems:   Acid reflux   Chronic combined systolic and diastolic heart failure (HCC)   Hyperlipidemia   SOB (shortness of breath)   PAF (paroxysmal atrial fibrillation) (HCC)   Acute hypoxic respiratory distress due to acute bronchitis: Possibility of undiagnosed COPD, history of lung mass..  Continue supplemental oxygen.  Patient was hypoxic on presentation with pulse ox of 88% on room air initially.  CTA was chest showed no evidence of pulmonary embolism, infiltrate edema.  Continue azithromycin.  BNP elevated at 245.  VBG showed pH of 7.2 with PCO2 of 58.    Currently has been weaned off to room air at this time.  Continue with  inhaler.  Type 2 diabetes mellitus with peripheral neuropathy.   On metformin, Jardiance, and glimepiride at home.  Continue sliding scale insulin Accu-Cheks, diabetic diet.  Continue Lyrica, renally dosed.  Paroxysmal atrial fibrillation:  CHA2DS2-VASc score of6,  patient is on Xarelto at home.  Continue metoprolol and Xarelto..  EKG on presentation showed sinus rhythm.  Continue amiodarone.   Chronic systolic/diastolic heart failure: Chest x-ray without any congestion.  On oral Lasix.  Continue beta-blockers.  Hyperlipidemia: On atorvastatin as an outpatient. Plan: Continue on statin.  Stage IIIa chronic kidney disease:  Baseline creatinine of around 1.2-1.5.    DVT prophylaxis: SCDs Start: 02/16/21 0151 rivaroxaban (XARELTO) tablet 20 mg    Code Status: Full code  Family Communication: None.  Status is: Inpatient  Remains inpatient appropriate because:IV treatments appropriate due to intensity of illness or inability to take PO and Inpatient level of care appropriate due to severity of illness, on IV antibiotics   Dispo: The patient is from: Home              Anticipated d/c is to: Home              Patient currently is not medically stable to d/c.  Will get PT evaluation, ambulate the patient.   Difficult to place patient No   Consultants:  None  Procedures:  None  Anti-infectives:  [name] Azithromycin Rocephin  Anti-infectives (From admission, onward)   Start     Dose/Rate Route Frequency Ordered Stop   02/16/21 0400  azithromycin (ZITHROMAX) 500 mg in sodium chloride 0.9 %  250 mL IVPB        500 mg 250 mL/hr over 60 Minutes Intravenous Every 24 hours 02/16/21 0349     02/16/21 0130  cefTRIAXone (ROCEPHIN) 1 g in sodium chloride 0.9 % 100 mL IVPB  Status:  Discontinued        1 g 200 mL/hr over 30 Minutes Intravenous  Once 02/16/21 0123 02/16/21 0258     Subjective: Today, patient was seen and examined at bedside.  Patient feels little better with  breathing today.  Has been weaned off oxygen this morning.  Still has some cough.  Improved weakness today.  Objective: Vitals:   02/16/21 2140 02/17/21 0527  BP: 114/62 (!) 94/39  Pulse: 82 71  Resp: 16 14  Temp: 99.3 F (37.4 C) 98.2 F (36.8 C)  SpO2: 93% 98%    Intake/Output Summary (Last 24 hours) at 02/17/2021 1109 Last data filed at 02/17/2021 0900 Gross per 24 hour  Intake 480 ml  Output --  Net 480 ml   Filed Weights   02/15/21 2139 02/16/21 0900 02/17/21 0527  Weight: 92 kg 91.4 kg 91.3 kg   Body mass index is 26.56 kg/m.   Physical Exam: GENERAL: Patient is alert awake and communicative, not in obvious distress. HENT: No scleral pallor or icterus. Pupils equally reactive to light. Oral mucosa is moist NECK: is supple, no gross swelling noted. CHEST:  Diminished breath sounds bilaterally. CVS: S1 and S2 heard, no murmur. Regular rate and rhythm.  ABDOMEN: Soft, non-tender, bowel sounds are present. EXTREMITIES: No edema. CNS: Cranial nerves are intact. No focal motor deficits. SKIN: warm and dry without rashes.  Data Review: I have personally reviewed the following laboratory data and studies,  CBC: Recent Labs  Lab 02/15/21 2215 02/16/21 0550  WBC 5.9 6.6  NEUTROABS 4.2 4.1  HGB 12.0* 12.2*  HCT 35.5* 36.4*  MCV 94.7 95.8  PLT 165 253   Basic Metabolic Panel: Recent Labs  Lab 02/15/21 2215 02/16/21 0550  NA 133* 138  K 4.6 4.2  CL 102 105  CO2 23 24  GLUCOSE 119* 159*  BUN 23 23  CREATININE 1.50* 1.52*  CALCIUM 9.1 9.4  MG  --  2.2  PHOS  --  3.7   Liver Function Tests: Recent Labs  Lab 02/15/21 2215 02/16/21 0550  AST 27 31  ALT 25 28  ALKPHOS 61 57  BILITOT 0.8 0.6  PROT 7.1 6.8  ALBUMIN 3.6 3.5   No results for input(s): LIPASE, AMYLASE in the last 168 hours. No results for input(s): AMMONIA in the last 168 hours. Cardiac Enzymes: No results for input(s): CKTOTAL, CKMB, CKMBINDEX, TROPONINI in the last 168 hours. BNP  (last 3 results) Recent Labs    12/15/20 1246 02/16/21 0550  BNP 142.7* 245.4*    ProBNP (last 3 results) No results for input(s): PROBNP in the last 8760 hours.  CBG: Recent Labs  Lab 02/16/21 0809 02/16/21 1252 02/16/21 1728 02/16/21 2203 02/17/21 0743  GLUCAP 112* 170* 142* 209* 111*   Recent Results (from the past 240 hour(s))  Resp Panel by RT-PCR (Flu A&B, Covid) Nasopharyngeal Swab     Status: None   Collection Time: 02/15/21 10:15 PM   Specimen: Nasopharyngeal Swab; Nasopharyngeal(NP) swabs in vial transport medium  Result Value Ref Range Status   SARS Coronavirus 2 by RT PCR NEGATIVE NEGATIVE Final    Comment: (NOTE) SARS-CoV-2 target nucleic acids are NOT DETECTED.  The SARS-CoV-2 RNA is generally detectable in upper  respiratory specimens during the acute phase of infection. The lowest concentration of SARS-CoV-2 viral copies this assay can detect is 138 copies/mL. A negative result does not preclude SARS-Cov-2 infection and should not be used as the sole basis for treatment or other patient management decisions. A negative result may occur with  improper specimen collection/handling, submission of specimen other than nasopharyngeal swab, presence of viral mutation(s) within the areas targeted by this assay, and inadequate number of viral copies(<138 copies/mL). A negative result must be combined with clinical observations, patient history, and epidemiological information. The expected result is Negative.  Fact Sheet for Patients:  EntrepreneurPulse.com.au  Fact Sheet for Healthcare Providers:  IncredibleEmployment.be  This test is no t yet approved or cleared by the [place] FDA and  has been authorized for detection and/or diagnosis of SARS-CoV-2 by FDA under an Emergency Use Authorization (EUA). This EUA will remain  in effect (meaning this test can be used) for the duration of the COVID-19 declaration under  Section 564(b)(1) of the Act, 21 U.S.C.section 360bbb-3(b)(1), unless the authorization is terminated  or revoked sooner.       Influenza A by PCR NEGATIVE NEGATIVE Final   Influenza B by PCR NEGATIVE NEGATIVE Final    Comment: (NOTE) The Xpert Xpress SARS-CoV-2/FLU/RSV plus assay is intended as an aid in the diagnosis of influenza from Nasopharyngeal swab specimens and should not be used as a sole basis for treatment. Nasal washings and aspirates are unacceptable for Xpert Xpress SARS-CoV-2/FLU/RSV testing.  Fact Sheet for Patients: EntrepreneurPulse.com.au  Fact Sheet for Healthcare Providers: IncredibleEmployment.be  This test is not yet approved or cleared by the [place] FDA and has been authorized for detection and/or diagnosis of SARS-CoV-2 by FDA under an Emergency Use Authorization (EUA). This EUA will remain in effect (meaning this test can be used) for the duration of the COVID-19 declaration under Section 564(b)(1) of the Act, 21 U.S.C. section 360bbb-3(b)(1), unless the authorization is terminated or revoked.  Performed at [facility], [place] [address] [place], [place] [address]   Blood culture (routine x 2)     Status: None (Preliminary result)   Collection Time: 02/15/21 10:15 PM   Specimen: BLOOD  Result Value Ref Range Status   Specimen Description   Final    BLOOD RIGHT ANTECUBITAL Performed at [facility] [address], [place], [place] [address]    Special Requests   Final    BOTTLES DRAWN AEROBIC AND ANAEROBIC Blood Culture adequate volume Performed at [facility] [address] [place], [place] [address]    Culture   Final    NO GROWTH 1 DAY Performed at [facility], [place] [address], [place], [place] [address]    Report Status PENDING  Incomplete  Blood culture (routine x 2)     Status: None (Preliminary result)   Collection Time:  02/16/21 12:00 AM   Specimen: BLOOD  Result Value Ref Range Status   Specimen Description   Final    BLOOD LEFT ANTECUBITAL Performed at [facility] [address] [place], [place] [address]    Special Requests   Final    BOTTLES DRAWN AEROBIC AND ANAEROBIC Blood Culture adequate volume Performed at [facility] [address], [place], [place] [address]    Culture   Final    NO GROWTH 1 DAY Performed at [facility], [place] [address] [place], [place] [address]    Report  Status PENDING  Incomplete     Studies: CT Angio Chest PE W and/or Wo Contrast  Result Date: 02/16/2021 CLINICAL DATA:  Chest pain and shortness of breath, possible COVID-19 positivity EXAM: CT ANGIOGRAPHY CHEST WITH CONTRAST TECHNIQUE: Multidetector CT imaging of the chest was performed using the standard protocol during bolus administration of intravenous contrast. Multiplanar CT image reconstructions and MIPs were obtained to evaluate the vascular anatomy. CONTRAST:  135mL OMNIPAQUE IOHEXOL 350 MG/ML SOLN COMPARISON:  08/08/2011 FINDINGS: Cardiovascular: Thoracic aorta demonstrates atherosclerotic calcifications without aneurysmal dilatation or dissection. Coronary calcifications are seen. Heart is at the upper limits of normal in size. Pulmonary artery shows a normal branching pattern bilaterally. No focal filling defects are identified to suggest pulmonary emboli. Mediastinum/Nodes: Thoracic inlet is within normal limits. No sizable hilar or mediastinal adenopathy is noted. The esophagus as visualized limits. Lungs/Pleura: Emphysematous changes are noted. Right lung is well aerated without focal infiltrate or sizable effusion. The left lung demonstrates a small nodular density in the lower lobe along the diaphragm best seen on image number 121 of series 10. This is smaller in size than that seen on the prior exam and felt to be benign in etiology given its long-term presence.  In the superior segment of the left lower lobe however there is a somewhat spiculated appearing soft tissue lesion which measures approximately 2.9 x 1.9 cm. It abuts the pulmonary artery in this region and causes mild contour deformity. This is best appreciated on image number 65 series 10. 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 Partially calcified pleural plaque is noted along the superior segment of the right lower stable in appearance from the prior exam. Upper Abdomen: Visualized upper abdomen shows no acute abnormality. Musculoskeletal: Degenerative changes of the thoracic spine are noted. No rib abnormality is noted. Review of the MIP images confirms the above findings. IMPRESSION: Spiculated appearing soft tissue mass lesion in the superior segment of the left lower lobe as described above. This causes some contour deformity on the pulmonary artery as described above. 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 Bronchoscopic evaluation may be helpful. No evidence of pulmonary emboli. Slightly smaller nodule in the left lower lobe when compared with the prior exam from 20/12 consistent with a benign etiology. Aortic Atherosclerosis (ICD10-I70.0) and Emphysema (ICD10-J43.9). Electronically Signed   By: Inez Catalina M.D.   On: 02/16/2021 01:03   DG Chest Portable 1 View  Result Date: 02/15/2021 CLINICAL DATA:  Cough, fever, hypoxia EXAM: PORTABLE CHEST 1 VIEW COMPARISON:  12/15/2020 FINDINGS: Heart and mediastinal contours are within normal limits. No focal opacities or effusions. No acute bony abnormality. IMPRESSION: No active disease. Electronically Signed   By: Rolm Baptise M.D.   On: 02/15/2021 22:49      Flora Lipps, MD  [facility] 02/17/2021  If 7PM-7AM, please contact night-coverage

## 2021-02-17 NOTE — Evaluation (Signed)
Physical Therapy Evaluation Patient Details Name: [name] MRN: [id number] DOB: [birth year] Today's Date: 02/17/2021   History of Present Illness  80 y.o. male who was admitted to hospital with cough and respiratory distress and need for new oxygen supplementation.  He also complained of generalized weakness and fatigue. Dx of bronchitis. Pt. with medical history of lung mass, type 2 diabetes mellitus with polyneuropathy, paroxysmal atrial fibrillation on Xarelto, CHF, hyperlipidemia, stage III CKD.  Clinical Impression  Pt admitted with above diagnosis. Pt ambulated 140' with RW, no loss of balance, SaO2 97% on room air walking. Pt reports h/o several falls in past 6 months, HHPT recommended for home safety evaluation and to address balance.  Pt currently with functional limitations due to the deficits listed below (see PT Problem List). Pt will benefit from skilled PT to increase their independence and safety with mobility to allow discharge to the venue listed below.       Follow Up Recommendations Home health PT    Equipment Recommendations  None recommended by PT    Recommendations for Other Services       Precautions / Restrictions Precautions Precautions: Fall Precaution Comments: multiple falls in past 6 months Restrictions Weight Bearing Restrictions: No      Mobility  Bed Mobility Overal bed mobility: Needs Assistance Bed Mobility: Supine to Sit     Supine to sit: Min assist     General bed mobility comments: min A to raise trunk    Transfers Overall transfer level: Needs assistance Equipment used: Rolling walker (2 wheeled) Transfers: Sit to/from Stand Sit to Stand: Min guard         General transfer comment: mild posterior bias initially but pt self corrected, min guard for safety  Ambulation/Gait Ambulation/Gait assistance: Min guard Gait Distance (Feet): 140 Feet Assistive device: Rolling walker (2 wheeled) Gait Pattern/deviations: Step-through  pattern;Decreased stride length;Trunk flexed Gait velocity: WFL   General Gait Details: VCs for posture, no loss of balance, no dyspnea, SaO2 97% on room air walking  Stairs            Wheelchair Mobility    Modified Rankin (Stroke Patients Only)       Balance Overall balance assessment: Modified Independent                                           Pertinent Vitals/Pain Pain Assessment: No/denies pain    Home Living Family/patient expects to be discharged to:: Private residence Living Arrangements: Spouse/significant other Available Help at Discharge: Family;Available 24 hours/day Type of Home: House Home Access: Stairs to enter Entrance Stairs-Rails: Right;Left;Can reach both Entrance Stairs-Number of Steps: 2-3 Home Layout: One level Home Equipment: Walker - 4 wheels;Cane - single point;Shower seat;Grab bars - toilet;Grab bars - tub/shower      Prior Function Level of Independence: Independent with assistive device(s)         Comments: uses rollators for mobility, independent with ADLs , still drives some. Has had some falls     Hand Dominance        Extremity/Trunk Assessment   Upper Extremity Assessment Upper Extremity Assessment: Overall WFL for tasks assessed    Lower Extremity Assessment Lower Extremity Assessment: RLE deficits/detail;LLE deficits/detail RLE Sensation: decreased light touch;history of peripheral neuropathy LLE Sensation: decreased light touch;history of peripheral neuropathy    Cervical / Trunk Assessment Cervical / Trunk Assessment: Kyphotic  Communication   Communication: No difficulties  Cognition Arousal/Alertness: Awake/alert Behavior During Therapy: WFL for tasks assessed/performed Overall Cognitive Status: Within Functional Limits for tasks assessed                                        General Comments      Exercises     Assessment/Plan    PT Assessment Patient needs  continued PT services  PT Problem List Decreased activity tolerance;Decreased mobility;Decreased balance       PT Treatment Interventions Gait training;Balance training;Therapeutic activities    PT Goals (Current goals can be found in the Care Plan section)  Acute Rehab PT Goals Patient Stated Goal: work on balance and strength PT Goal Formulation: With patient/family Time For Goal Achievement: 03/03/21 Potential to Achieve Goals: Good    Frequency Min 3X/week   Barriers to discharge        Co-evaluation               AM-PAC PT "6 Clicks" Mobility  Outcome Measure Help needed turning from your back to your side while in a flat bed without using bedrails?: None Help needed moving from lying on your back to sitting on the side of a flat bed without using bedrails?: A Little Help needed moving to and from a bed to a chair (including a wheelchair)?: A Little Help needed standing up from a chair using your arms (e.g., wheelchair or bedside chair)?: A Little Help needed to walk in hospital room?: A Little Help needed climbing 3-5 steps with a railing? : A Little 6 Click Score: 19    End of Session Equipment Utilized During Treatment: Gait belt Activity Tolerance: Patient tolerated treatment well Patient left: in chair;with call bell/phone within reach;with chair alarm set Nurse Communication: Mobility status PT Visit Diagnosis: Difficulty in walking, not elsewhere classified (R26.2)    Time: 5631-4970 PT Time Calculation (min) (ACUTE ONLY): 24 min   Charges:   PT Evaluation $PT Eval Moderate Complexity: 1 Mod PT Treatments $Gait Training: 8-22 mins       Blondell Reveal Kistler PT 02/17/2021  Acute Rehabilitation Services Pager [phone] Office [phone]

## 2021-02-18 DIAGNOSIS — I5042 Chronic combined systolic (congestive) and diastolic (congestive) heart failure: Secondary | ICD-10-CM | POA: Diagnosis not present

## 2021-02-18 DIAGNOSIS — E7849 Other hyperlipidemia: Secondary | ICD-10-CM | POA: Diagnosis not present

## 2021-02-18 DIAGNOSIS — J209 Acute bronchitis, unspecified: Secondary | ICD-10-CM | POA: Diagnosis not present

## 2021-02-18 DIAGNOSIS — K219 Gastro-esophageal reflux disease without esophagitis: Secondary | ICD-10-CM | POA: Diagnosis not present

## 2021-02-18 LAB — CBC
HCT: 35.5 % — ABNORMAL LOW (ref 39.0–52.0)
Hemoglobin: 12.1 g/dL — ABNORMAL LOW (ref 13.0–17.0)
MCH: 31.9 pg (ref 26.0–34.0)
MCHC: 34.1 g/dL (ref 30.0–36.0)
MCV: 93.7 fL (ref 80.0–100.0)
Platelets: 174 10*3/uL (ref 150–400)
RBC: 3.79 MIL/uL — ABNORMAL LOW (ref 4.22–5.81)
RDW: 14.6 % (ref 11.5–15.5)
WBC: 4.5 10*3/uL (ref 4.0–10.5)
nRBC: 0 % (ref 0.0–0.2)

## 2021-02-18 LAB — BASIC METABOLIC PANEL
Anion gap: 9 (ref 5–15)
BUN: 34 mg/dL — ABNORMAL HIGH (ref 8–23)
CO2: 26 mmol/L (ref 22–32)
Calcium: 9.4 mg/dL (ref 8.9–10.3)
Chloride: 104 mmol/L (ref 98–111)
Creatinine, Ser: 1.54 mg/dL — ABNORMAL HIGH (ref 0.61–1.24)
GFR, Estimated: 45 mL/min — ABNORMAL LOW (ref >60)
Glucose, Bld: 152 mg/dL — ABNORMAL HIGH (ref 70–99)
Potassium: 4 mmol/L (ref 3.5–5.1)
Sodium: 139 mmol/L (ref 135–145)

## 2021-02-18 LAB — GLUCOSE, CAPILLARY
Glucose-Capillary: 155 mg/dL — ABNORMAL HIGH (ref 70–99)
Glucose-Capillary: 184 mg/dL — ABNORMAL HIGH (ref 70–99)

## 2021-02-18 LAB — MAGNESIUM: Magnesium: 2.2 mg/dL (ref 1.7–2.4)

## 2021-02-18 MED ORDER — AZITHROMYCIN 250 MG PO TABS: 250.0000 mg | ORAL_TABLET | Freq: Every day | ORAL | 0 refills | Status: AC

## 2021-02-18 MED ORDER — ALBUTEROL SULFATE HFA 108 (90 BASE) MCG/ACT IN AERS: 1.0000 | INHALATION_SPRAY | RESPIRATORY_TRACT | 2 refills | Status: DC | PRN

## 2021-02-18 MED ORDER — AZITHROMYCIN 250 MG PO TABS: 500.0000 mg | ORAL_TABLET | Freq: Every day | ORAL | Status: DC

## 2021-02-18 NOTE — TOC Progression Note (Signed)
Transition of Care [facility]) - Progression Note    Patient Details  Name: [name] MRN: [id number] Date of Birth: 1941-07-03  [facility] CM/SW Contact  Purcell Mouton, RN Phone Number: 02/18/2021, 11:17 AM  Clinical Narrative:     Spoke with pt concerning discharge needs. [facility] was selected for [facility] Health. Referral was given to in house rep with [facility].        Expected Discharge Plan and Services           Expected Discharge Date: 02/18/21                                     Social Determinants of Health (SDOH) Interventions    Readmission Risk Interventions No flowsheet data found.

## 2021-02-18 NOTE — Discharge Instructions (Signed)
10 Things You Can Do to Manage Your COVID-19 Symptoms at Home If you have possible or confirmed COVID-19: 1. Stay home except to get medical care. 2. Monitor your symptoms carefully. If your symptoms get worse, call your healthcare provider immediately. 3. Get rest and stay hydrated. 4. If you have a medical appointment, call the healthcare provider ahead of time and tell them that you have or may have COVID-19. 5. For medical emergencies, call 911 and notify the dispatch personnel that you have or may have COVID-19. 6. Cover your cough and sneezes with a tissue or use the inside of your elbow. 7. Wash your hands often with soap and water for at least 20 seconds or clean your hands with an alcohol-based hand sanitizer that contains at least 60% alcohol. 8. As much as possible, stay in a specific room and away from other people in your home. Also, you should use a separate bathroom, if available. If you need to be around other people in or outside of the home, wear a mask. 9. Avoid sharing personal items with other people in your household, like dishes, towels, and bedding. 10. Clean all surfaces that are touched often, like counters, tabletops, and doorknobs. Use household cleaning sprays or wipes according to the label instructions. [email or web address] 05/12/2020 This information is not intended to replace advice given to you by your health care provider. Make sure you discuss any questions you have with your health care provider. Document Revised: 08/28/2020 Document Reviewed: 08/28/2020 Elsevier Patient Education  2021 [place].   Acute Respiratory Failure, Adult Acute respiratory failure is a condition that is a medical emergency. It can develop quickly, and it should be treated right away. There are two types of acute respiratory failure:  Type I respiratory failure is when the lungs are not able to get enough oxygen into the blood. This causes the blood oxygen level to drop.  Type  II respiratory failure is when carbon dioxide is not passing from the lungs out of the body. This causes carbon dioxide to build up in the blood. A person may have one type of acute respiratory failure or have both types at the same time. What are the causes? Common causes of type I respiratory failure include:  Trauma to the lung, chest, ribs, or tissues around the lung.  Pneumonia.  Lung diseases, such as pulmonary fibrosis or asthma.  Smoke, chemical, or water inhalation.  A blood clot in the lungs (pulmonary embolism).  A blood infection (sepsis).  Heart attack. Common causes of type II respiratory failure include:  Stroke.  A spinal cord injury.  A drug or alcohol overdose.  A blood infection (sepsis).  Cardiac arrest. What increases the risk? This condition is more likely to develop in people who have:  Lung diseases such as asthma or chronic obstructive pulmonary disease (COPD).  A condition that damages or weakens the muscles, nerves, bones, or tissues that are involved in breathing, such as myasthenia gravis or Guillain-Barr syndrome.  A serious infection.  A health problem that blocks the unconscious reflex that is involved in breathing, such as hypothyroidism or sleep apnea. What are the signs or symptoms? Trouble breathing is the main symptom of acute respiratory failure. Symptoms may also include:  Fast breathing.  Restlessness or anxiety.  Breathing loudly (wheezing) and grunting.  Fast or irregular heartbeats (palpitations).  Confusion or changes in behavior.  Feeling tired (fatigue), sleeping more than normal, or being hard to wake.  Skin, lips, or fingernails  that appear blue (cyanosis). How is this diagnosed? This condition may be diagnosed based on:  Your medical history and a physical exam. Your health care provider will listen to your heart and lungs to check for abnormal sounds.  Tests to confirm the diagnosis and determine the cause  of respiratory failure. These tests may include: ? Measuring the amount of oxygen in your blood (pulse oximetry). The measurement comes from a small device that is placed on your finger, earlobe, or toe. ? Blood tests to measure blood oxygen and carbon dioxide and to look for signs of infection. ? Tests on a sample of the fluid that surrounds the spinal cord (cerebrospinal fluid) or a sample of fluid that is drawn from the windpipe (trachea) to check for infections. ? Chest X-ray. ? Electrocardiogram (ECG) to look at the heart's electrical activity.   How is this treated? Treatment for this condition usually takes place in a hospital intensive care unit (ICU). Treatment depends on what is causing the condition. It may include one or more of these treatments:  Oxygen may be given through your nose or a face mask.  A device such as a continuous positive airway pressure (CPAP) machine or bi-level positive airway pressure (BPAP) machine may be used to help you breathe. The device gives you oxygen and pressure.  Breathing treatments, fluids, and other medicines may be given.  A ventilator may be used to help you breathe. The machine gives you oxygen and pressure. A tube is put into your mouth and trachea to connect the ventilator. ? If this treatment is needed longer term, a tracheostomy may be placed. A tracheostomy is a breathing tube put through your neck into your trachea.  In extreme cases, extracorporeal life support (ECLS) may be used. This treatment temporarily takes over the function of the heart and lungs, supplying oxygen and removing carbon dioxide. ECLS gives the lungs a chance to recover. Follow these instructions at home: Medicines  Take over-the-counter and prescription medicines only as told by your health care provider.  If you were prescribed an antibiotic medicine, take it as told by your health care provider. Do not stop using the antibiotic even if you start to feel  better.  If you are taking blood thinners: ? Talk with your health care provider before you take any medicines that contain aspirin or NSAIDs, such as ibuprofen. These medicines increase your risk for dangerous bleeding. ? Take your medicine exactly as told, at the same time every day. ? Avoid activities that could cause injury or bruising, and follow instructions about how to prevent falls. ? Wear a medical alert bracelet or carry a card that lists what medicines you take. General instructions  Return to your normal activities as told by your health care provider. Ask your health care provider what activities are safe for you.  Do not use any products that contain nicotine or tobacco, such as cigarettes, e-cigarettes, and chewing tobacco. If you need help quitting, ask your health care provider.  Do not drink alcohol if: ? Your health care provider tells you not to drink. ? You are pregnant, may be pregnant, or are planning to become pregnant.  Wear compression stockings as told by your health care provider. These stockings help to prevent blood clots and reduce swelling in your legs.  Attend any physical therapy and pulmonary rehabilitation as told by your health care provider.  Keep all follow-up visits as told by your health care provider. This is important. How  is this prevented?  If you have an infection or a medical condition that may lead to acute respiratory failure, make sure you get proper treatment. Contact a health care provider if:  You have a fever.  Your symptoms do not improve or they get worse. Get help right away if:  You are having trouble breathing.  You lose consciousness.  You develop a fast heart rate.  Your fingers, lips, or other areas turn blue.  You are confused. These symptoms may represent a serious problem that is an emergency. Do not wait to see if the symptoms will go away. Get medical help right away. Call your local emergency services (911  in the [place] Do not drive yourself to the hospital. Summary  Acute respiratory failure is a medical emergency. It can develop quickly, and it should be treated right away.  Treatment for this condition usually takes place in a hospital intensive care unit (ICU). Treatment may include oxygen, fluids, and medicines. A device may be used to help you breathe, such as a ventilator.  Take over-the-counter and prescription medicines only as told by your health care provider.  Contact a health care provider if your symptoms do not improve or if they get worse. This information is not intended to replace advice given to you by your health care provider. Make sure you discuss any questions you have with your health care provider. Document Revised: 10/01/2019 Document Reviewed: 10/01/2019 Elsevier Patient Education  [place] precautions are guidelines for the care of a person who has a disease that spreads through germs (particles) in the air. Examples of airborne diseases include measles, chickenpox, smallpox, tuberculosis, and possibly severe acute respiratory syndrome (SARS)-associated coronavirus. Following these guidelines helps keep the disease from spreading to others. Guidelines for patients If you have an airborne disease, follow these guidelines at the hospital or clinic:  You will be treated in a private room with a special air supply. ? Keep the door to your private room closed. ? Wear a mask in your room as told by your nurse. ? Check with your nurse before you leave your room.  Wear a mask if you must go to another area of the hospital or clinic. Leave your room only when medically necessary. Make sure that the mask fits tightly.  Cover your mouth with a tissue when you cough.  Cover your mouth and nose with a tissue when you sneeze.  If you have sores that can spread the infection (infectious lesions), keep the affected areas  covered.  Wash your hands often with soap and water for at least 20 seconds. This is an important way to prevent spread of the disease. If soap and water are not available, use an alcohol-based hand sanitizer.      Guidelines for visitors If you are visiting someone who has an airborne disease, follow these guidelines:  Check with a nurse before you enter a room that has a sign that says "Airborne Precautions."  If you are allowed to enter the room, you will be asked to wash your hands and wear a mask over your nose and mouth. Make sure that the mask fits tightly.  Do not take off your mask in the room.  Do not eat or drink in the room.  Do not use or touch any items in the room unless you ask a nurse first.  Right after you leave the room: 1. Take off your mask. Throw it in  the trash. 2. Wash your hands with soap and water for at least 20 seconds. This is an important way to prevent spread of the disease. If soap and water are not available, use an alcohol-based hand sanitizer. Summary  Airborne precautions are guidelines for the care of a person who has a disease that spreads through germs (particles) in the air.  If you are a patient, keep the door to your room closed and wear a mask.  If you are a visitor, check with the nurse before you enter the room, and wear a mask. Do not take it off while in the room.  Washing your hands with soap and water for at least 20 seconds is an important way to prevent spread of disease. If soap and water are not available, use an alcohol-based hand sanitizer. This information is not intended to replace advice given to you by your health care provider. Make sure you discuss any questions you have with your health care provider. Document Revised: 11/16/2019 Document Reviewed: 03/23/2019 Elsevier Patient Education  [place].  3 Key Steps to Take While Waiting for Your COVID-19 Test Result To help stop the spread of COVID-19, take these 3  key steps NOW while waiting for your test results: 1. Stay home and monitor your health. Stay home and monitor your health to help protect your friends, family, and others from possibly getting COVID-19 from you. Stay home and away from others:  If possible, stay away from others, especially people who are at higher risk for getting very sick from COVID-19, such as older adults and people with other medical conditions.  If you have been in contact with someone with COVID-19, stay home and away from others for 14 days after your last contact with that person. Follow the recommendations of your local public health department if you need to quarantine.  If you have a fever, cough or other symptoms of COVID-19, stay home and away from others (except to get medical care). Monitor your health:  Watch for fever, cough, shortness of breath, or other symptoms of COVID-19. Remember, symptoms may appear 2-14 days after exposure to COVID-19 and can include: ? Fever or chills ? Cough ? Shortness of breath or difficulty breathing ? Tiredness ? Muscle or body aches ? Headache ? New loss of taste or smell ? Sore throat ? Congestion or runny nose ? Nausea or vomiting ? Diarrhea 2. Think about the people you have recently been around. If you are diagnosed with [id number], a public health worker may call you to check on your health, discuss who you have been around, and ask where you spent time while you may have been able to spread COVID-19 to others. While you wait for your COVID-19 test result, think about everyone you have been around recently. This will be important information to give health workers if your test is positive.  Complete the information on the back of this page to help you remember everyone you have been around.  3. Answer the phone call from the health department. If a public health worker calls you, answer the call to help slow the spread of COVID-19 in your community.  Discussions  with health department staff are confidential. This means that your personal and medical information will be kept private and only shared with those who may need to know, like your health care provider.  Your name will not be shared with those you came in contact with. The health department will only  notify people you were in close contact with (within 6 feet for more than 15 minutes) that they might have been exposed to COVID-19. Think about the people you have recently been around If you test positive and are diagnosed with COVID-19, someone from the health department may call to check-in on your health, discuss who you have been around, and ask where you spent time while you may have been able to spread COVID-19 to others. This form can help you think about people you have recently been around so you will be ready if a public health worker calls you. Things to think about. Have you:  Gone to work or school?  Gotten together with others (eaten out at [employer], gone out for drinks, exercised with others or gone to a gym, had friends or family over to your house, volunteered, gone to a party, pool, or park)?  Gone to a store in person (e.g., grocery store, mall)?  Gone to in-person appointments (e.g., salon, barber, doctor's or dentist's office)?  Ridden in a car with others (e.g., rideshare) or taken public transportation?  Been inside a church, synagogue, mosque or other places of worship? Who lives with you?  ______________________________________________________________________  ______________________________________________________________________  ______________________________________________________________________  ______________________________________________________________________ Who have you been around (less than 6 feet for a total of 15 minutes or more) in the last 10 days? (You may have more people to list than the space provided. If so, write on the front of this  sheet or a separate piece of paper.) Name ______________________________________________  Phone number ____________________________________  Date you last saw them _____________________________  Where you last saw them ________________________________________________ Name ______________________________________________  Phone number ____________________________________  Date you last saw them _____________________________  Where you last saw them ________________________________________________ Name ______________________________________________  Phone number ____________________________________  Date you last saw them _____________________________  Where you last saw them ________________________________________________ Name ______________________________________________  Phone number ____________________________________  Date you last saw them _____________________________  Where you last saw them ________________________________________________ Name ______________________________________________  Phone number ____________________________________  Date you last saw them _____________________________  Where you last saw them ________________________________________________ What have you done in the last 10 days with other people? Activity _____________________________________________  Location _________________________________________  Date ____________________________________________ Activity _____________________________________________  Location _________________________________________  Date ____________________________________________ Activity _____________________________________________  Location _________________________________________  Date ____________________________________________ Activity _____________________________________________  Location _________________________________________  Date ____________________________________________ Activity  _____________________________________________  Location _________________________________________  Date ____________________________________________ Centers for Disease Control and Prevention [email or web address] 05/05/2020 This information is not intended to replace advice given to you by your health care provider. Make sure you discuss any questions you have with your health care provider. Document Revised: 08/28/2020 Document Reviewed: 08/28/2020 Elsevier Patient Education  2021 [place].   Body Fluid Exposure Information People come in contact with blood and other body fluids in many ways. Sometimes, body fluids may have germs (bacteria or viruses) that can cause infections. These germs can be spread when an infected person's body fluids come in contact with the mouth, nose, eyes, genitals, or broken skin of another person. Broken skin includes chapped skin, cuts, abrasions, or irritation and swelling of the skin (dermatitis). You are more likely to be exposed to infected body fluids if you:  Are a health care worker or family member who is taking care of a sick person.  Use needles to inject drugs, and you share needles with other users.  Have sex or engage in other sexual activities without using a condom or other protection. The risk of an  infection spreading through exposure to body fluids is small and depends on several factors. These include:  The type of body fluid.  How you were exposed to the body fluid.  The type of infection.  The risk factors of the person who is the source of the body fluid. Your health care provider can help you assess the risk. What types of body fluids can spread infection? The following body fluids can spread infections:  Blood.  Semen.  Vaginal secretions.  Urine.  Feces.  Saliva.  Nasal or eye discharge.  Breast milk.  Amniotic fluid.  Fluids surrounding body organs.  Pus or other fluids coming from a wound. What are  some first-aid measures for body fluid exposure? The following steps should be taken as soon as possible after you are exposed to body fluids: Intact skin  For contact with closed skin, wash the area with soap and water. If soap and water are not available, use hand sanitizer. Broken skin  For contact with broken skin: ? Let the area bleed a little. ? Wash the area well with soap and water. If soap is not available, use just water or hand sanitizer. ? Place a bandage or clean towel on the wound and apply gentle pressure to stop the bleeding. Do not squeeze or rub the area. Do not use harsh chemicals such as bleach or iodine. Eyes  Rinse your eyes with water or saline solution for 30 seconds or longer.  If you are wearing contact lenses, leave the contact lenses in while rinsing your eyes. After the rinsing is complete, remove the contact lenses. Mouth  Spit out the fluids. Rinse with water 4-5 times, spitting it out each time. When should I seek help? After performing first aid:  Call your health care provider or seek emergency care right away if blood or other body fluids made contact with broken skin or the eyes, nose, mouth, or genitals.  Tell your work supervisor immediately if the exposure to body fluid happened in the workplace. Follow your company's procedures for dealing with body fluid exposure. What will happen after I report the exposure? Your health care provider will ask you several questions, including questions about:  Your medical history, including vaccine records.  Date and time of the exposure.  Whether you saw body fluids during the exposure.  Type of body fluid you were exposed to.  Volume of body fluid you were exposed to.  How the exposure happened.  Any devices used, such as needles.  The area of your body that made contact with the body fluid.  Any injury to your skin or other areas.  How long contact was made with the body fluid.  Whether the  person whose body fluid you were exposed to has certain risk factors or health conditions, if known. Your health care provider will assess your risk for infection. Often, no treatment is necessary. However, in some cases:  Your health care provider may recommend doing blood tests right away.  Follow-up blood tests may also be done at certain times during the upcoming weeks and months to check for any changes.  You may be offered treatment to prevent infection after exposure (post-exposure prophylaxis). This may include certain vaccines or medicines. This is necessary when there is a risk of a serious infection, such as HIV (human immunodeficiency virus) or hepatitis B. Your health care provider will discuss the right treatment and vaccines with you. How can I prevent infection? To reduce your chances of  getting an infection  Make sure your vaccines are up to date, including vaccines for tetanus and hepatitis.  Learn and follow any guidelines for preventing exposure (universal precautions) that are provided at your workplace.  Keep all follow-up visits as told by your health care provider. This is important. You will need to be monitored after you are evaluated for exposure to body fluids. To avoid spreading infection to others  Wash your hands frequently with soap and water. If soap and water are not available, use hand sanitizer.  Do not share toothbrushes, razors, dental floss, or other personal items.  Keep open wounds covered.  Dispose of any items with blood on them by putting them in the trash. This includes razors, tampons, and bandages.  Do not have sex or engage in sexual activities until you know you are free of infection.  Do not donate blood, plasma, breast milk, sperm, or other body fluids.  Do not share drug supplies with others. These include needles, syringes, straws, and pipes.  Follow all instructions from your health care provider for preventing the spread of  infection.   Summary  Contact with blood and other body fluids can happen in many ways. Sometimes, body fluids may have germs that can cause an infection.  Treatment depends on the type of body fluid you were exposed to and what part of your body was exposed.  Call your health care provider or seek emergency care right away if blood or other body fluids made contact with broken skin or the eyes, nose, mouth, or genitals.  Make sure all your vaccines are up to date, including vaccines for tetanus and hepatitis. This information is not intended to replace advice given to you by your health care provider. Make sure you discuss any questions you have with your health care provider. Document Revised: 02/12/2019 Document Reviewed: 11/24/2018 Elsevier Patient Education  2021 [employer].

## 2021-02-18 NOTE — Discharge Summary (Signed)
Physician Discharge Summary  [name] EVO:[id number] DOB: 20-Sep-1941 DOA: 02/15/2021  PCP: [name], MD  Admit date: 02/15/2021 Discharge date: 02/18/2021  Admitted From: Home  Discharge disposition: Home PT  Recommendations for Outpatient Follow-Up:   . Follow up with your primary care provider in one week.  . Check CBC, BMP, magnesium in the next visit  Discharge Diagnosis:   Principal Problem:   Acute bronchitis Active Problems:   Acid reflux   Chronic combined systolic and diastolic heart failure (HCC)   Hyperlipidemia   SOB (shortness of breath)   PAF (paroxysmal atrial fibrillation) ([place])   Discharge Condition: Improved.  Diet recommendation: Low sodium, heart healthy.   Wound care: None.  Code status: Full.   History of Present Illness:   [name] a 80 y.o.malewith medical history of lung mass, type 2 diabetes mellitus with polyneuropathy, paroxysmal atrial fibrillation on Xarelto, CHF, hyperlipidemia, stage III CKD was admitted to hospital with cough and respiratory distress and need for new oxygen supplementation.  He also complained of generalized weakness and fatigue.  In the ED, patient had a temperature of 99.9, oxygen saturation initially noted to be 88% on room air, which improved to 94% on 2 L nasal cannula.   No leukocytosis.  UA showed some white cells. EKG showed sinus rhythm.Chest x-ray showed no evidence of acute cardiopulmonary process.CT of the chest showed no evidence of acute pulmonary embolism as well as no evidence of infiltrate, edema, effusion, or pneumothorax. CT of the chest did show a spiculated soft tissue lesion in the left lower lobe, with further chart review revealing documentation of a known lung mass.  Patient was then admitted hospital for further evaluation and treatment of acute hypoxic respiratory failure with underlying suspected bronchitis.  Hospital Course:   Following conditions were addressed during  hospitalization as listed below,  Acute hypoxic respiratory failure due to acute bronchitis:Possibility of undiagnosed COPD, history of lung mass.  Patient initially required supplemental oxygen which was gradually weaned off. CTAwas chest showed no evidence of pulmonary embolism,infiltrate edema. Continue azithromycin to complete the course.Marland KitchenBNP was elevated at 245. VBG showed pH of 7.2 with PCO2 of 58.     Will prescribe bronchodilator inhaler on discharge.  Type 2 diabetes mellituswith peripheral neuropathy.  On metformin, Jardiance, and glimepirideat home. Continue sliding scale insulin Accu-Cheks, diabetic diet. Continue Lyrica.  Paroxysmal atrial fibrillation: CHA2DS2-VASc score of6, patient is on Xarelto at home. Continue metoprolol and Xarelto after discharge. EKG on presentation showed sinus rhythm. Continue amiodarone.   Chronic systolic/diastolic heart failure:Chest x-ray without any congestion. On oral Lasix, beta-blockers which will be continued on discharge..  Hyperlipidemia:On atorvastatin as an outpatient. Will resume on discharge.  Stage IIIa chronic kidney disease: Baseline creatinine of around 1.2-1.5. Latest creatinine of 1.5.  Disposition.  At this time, patient is stable for disposition home with outpatient PCP follow-up.  Medical Consultants:    None.  Procedures:    None Subjective:   Today, patient was seen and examined at bedside.  Feels better wishes to go home.  Off oxygen  Discharge Exam:   Vitals:   02/18/21 0809 02/18/21 1030  BP:  122/60  Pulse:  69  Resp:    Temp:    SpO2: 98%    Vitals:   02/18/21 0419 02/18/21 0807 02/18/21 0809 02/18/21 1030  BP: 108/66   122/60  Pulse: 70   69  Resp: 17     Temp: 97.8 F (36.6 C)  TempSrc: Oral     SpO2: 94% 98% 98%   Weight: 89.7 kg     Height:       General: Alert awake, not in obvious distress, on room air HENT: pupils equally reacting to light,  No  scleral pallor or icterus noted. Oral mucosa is moist.  Chest:  Clear breath sounds.  Diminished breath sounds bilaterally. No crackles or wheezes.  CVS: S1 &S2 heard. No murmur.  Regular rate and rhythm. Abdomen: Soft, nontender, nondistended.  Bowel sounds are heard.   Extremities: No cyanosis, clubbing or edema.  Peripheral pulses are palpable. Psych: Alert, awake and oriented, normal mood CNS:  No cranial nerve deficits.  Power equal in all extremities.   Skin: Warm and dry.  No rashes noted.  The results of significant diagnostics from this hospitalization (including imaging, microbiology, ancillary and laboratory) are listed below for reference.     Diagnostic Studies:   CT Angio Chest PE W and/or Wo Contrast  Result Date: 02/16/2021 CLINICAL DATA:  Chest pain and shortness of breath, possible COVID-19 positivity EXAM: CT ANGIOGRAPHY CHEST WITH CONTRAST TECHNIQUE: Multidetector CT imaging of the chest was performed using the standard protocol during bolus administration of intravenous contrast. Multiplanar CT image reconstructions and MIPs were obtained to evaluate the vascular anatomy. CONTRAST:  130mL OMNIPAQUE IOHEXOL 350 MG/ML SOLN COMPARISON:  08/08/2011 FINDINGS: Cardiovascular: Thoracic aorta demonstrates atherosclerotic calcifications without aneurysmal dilatation or dissection. Coronary calcifications are seen. Heart is at the upper limits of normal in size. Pulmonary artery shows a normal branching pattern bilaterally. No focal filling defects are identified to suggest pulmonary emboli. Mediastinum/Nodes: Thoracic inlet is within normal limits. No sizable hilar or mediastinal adenopathy is noted. The esophagus as visualized limits. Lungs/Pleura: Emphysematous changes are noted. Right lung is well aerated without focal infiltrate or sizable effusion. The left lung demonstrates a small nodular density in the lower lobe along the diaphragm best seen on image number 121 of series 10. This  is smaller in size than that seen on the prior exam and felt to be benign in etiology given its long-term presence. In the superior segment of the left lower lobe however there is a somewhat spiculated appearing soft tissue lesion which measures approximately 2.9 x 1.9 cm. It abuts the pulmonary artery in this region and causes mild contour deformity. This is best appreciated on image number 65 series 10. 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 Partially calcified pleural plaque is noted along the superior segment of the right lower stable in appearance from the prior exam. Upper Abdomen: Visualized upper abdomen shows no acute abnormality. Musculoskeletal: Degenerative changes of the thoracic spine are noted. No rib abnormality is noted. Review of the MIP images confirms the above findings. IMPRESSION: Spiculated appearing soft tissue mass lesion in the superior segment of the left lower lobe as described above. This causes some contour deformity on the pulmonary artery as described above. 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 Bronchoscopic evaluation may be helpful. No evidence of pulmonary emboli. Slightly smaller nodule in the left lower lobe when compared with the prior exam from 20/12 consistent with a benign etiology. Aortic Atherosclerosis (ICD10-I70.0) and Emphysema (ICD10-J43.9). Electronically Signed   By: Inez Catalina M.D.   On: 02/16/2021 01:03   DG Chest Portable 1 View  Result Date: 02/15/2021 CLINICAL DATA:  Cough, fever, hypoxia EXAM: PORTABLE CHEST 1 VIEW COMPARISON:  12/15/2020 FINDINGS: Heart and mediastinal contours are within normal  limits. No focal opacities or effusions. No acute bony abnormality. IMPRESSION: No active disease. Electronically Signed   By: Rolm Baptise M.D.   On: 02/15/2021 22:49     Labs:   Basic Metabolic Panel: Recent Labs  Lab 02/15/21 2215 02/16/21 0550 02/18/21 0603  NA 133*  138 139  K 4.6 4.2 4.0  CL 102 105 104  CO2 23 24 26   GLUCOSE 119* 159* 152*  BUN 23 23 34*  CREATININE 1.50* 1.52* 1.54*  CALCIUM 9.1 9.4 9.4  MG  --  2.2 2.2  PHOS  --  3.7  --    GFR Estimated Creatinine Clearance: 42.5 mL/min (A) (by C-G formula based on SCr of 1.54 mg/dL (H)). Liver Function Tests: Recent Labs  Lab 02/15/21 2215 02/16/21 0550  AST 27 31  ALT 25 28  ALKPHOS 61 57  BILITOT 0.8 0.6  PROT 7.1 6.8  ALBUMIN 3.6 3.5   No results for input(s): LIPASE, AMYLASE in the last 168 hours. No results for input(s): AMMONIA in the last 168 hours. Coagulation profile No results for input(s): INR, PROTIME in the last 168 hours.  CBC: Recent Labs  Lab 02/15/21 2215 02/16/21 0550 02/18/21 0603  WBC 5.9 6.6 4.5  NEUTROABS 4.2 4.1  --   HGB 12.0* 12.2* 12.1*  HCT 35.5* 36.4* 35.5*  MCV 94.7 95.8 93.7  PLT 165 153 174   Cardiac Enzymes: No results for input(s): CKTOTAL, CKMB, CKMBINDEX, TROPONINI in the last 168 hours. BNP: Invalid input(s): POCBNP CBG: Recent Labs  Lab 02/17/21 1201 02/17/21 1650 02/17/21 2140 02/18/21 0749 02/18/21 1216  GLUCAP 223* 169* 188* 155* 184*   D-Dimer No results for input(s): DDIMER in the last 72 hours. Hgb A1c No results for input(s): HGBA1C in the last 72 hours. Lipid Profile No results for input(s): CHOL, HDL, LDLCALC, TRIG, CHOLHDL, LDLDIRECT in the last 72 hours. Thyroid function studies No results for input(s): TSH, T4TOTAL, T3FREE, THYROIDAB in the last 72 hours.  Invalid input(s): FREET3 Anemia work up No results for input(s): VITAMINB12, FOLATE, FERRITIN, TIBC, IRON, RETICCTPCT in the last 72 hours. Microbiology Recent Results (from the past 240 hour(s))  Resp Panel by RT-PCR (Flu A&B, Covid) Nasopharyngeal Swab     Status: None   Collection Time: 02/15/21 10:15 PM   Specimen: Nasopharyngeal Swab; Nasopharyngeal(NP) swabs in vial transport medium  Result Value Ref Range Status   SARS Coronavirus 2 by RT  PCR NEGATIVE NEGATIVE Final    Comment: (NOTE) SARS-CoV-2 target nucleic acids are NOT DETECTED.  The SARS-CoV-2 RNA is generally detectable in upper respiratory specimens during the acute phase of infection. The lowest concentration of SARS-CoV-2 viral copies this assay can detect is 138 copies/mL. A negative result does not preclude SARS-Cov-2 infection and should not be used as the sole basis for treatment or other patient management decisions. A negative result may occur with  improper specimen collection/handling, submission of specimen other than nasopharyngeal swab, presence of viral mutation(s) within the areas targeted by this assay, and inadequate number of viral copies(<138 copies/mL). A negative result must be combined with clinical observations, patient history, and epidemiological information. The expected result is Negative.  Fact Sheet for Patients:  EntrepreneurPulse.com.au  Fact Sheet for Healthcare Providers:  IncredibleEmployment.be  This test is no t yet approved or cleared by the [place] FDA and  has been authorized for detection and/or diagnosis of SARS-CoV-2 by FDA under an Emergency Use Authorization (EUA). This EUA will remain  in effect (meaning  this test can be used) for the duration of the COVID-19 declaration under Section 564(b)(1) of the Act, 21 U.S.C.section 360bbb-3(b)(1), unless the authorization is terminated  or revoked sooner.       Influenza A by PCR NEGATIVE NEGATIVE Final   Influenza B by PCR NEGATIVE NEGATIVE Final    Comment: (NOTE) The Xpert Xpress SARS-CoV-2/FLU/RSV plus assay is intended as an aid in the diagnosis of influenza from Nasopharyngeal swab specimens and should not be used as a sole basis for treatment. Nasal washings and aspirates are unacceptable for Xpert Xpress SARS-CoV-2/FLU/RSV testing.  Fact Sheet for Patients: EntrepreneurPulse.com.au  Fact Sheet for  Healthcare Providers: IncredibleEmployment.be  This test is not yet approved or cleared by the [place] FDA and has been authorized for detection and/or diagnosis of SARS-CoV-2 by FDA under an Emergency Use Authorization (EUA). This EUA will remain in effect (meaning this test can be used) for the duration of the COVID-19 declaration under Section 564(b)(1) of the Act, 21 U.S.C. section 360bbb-3(b)(1), unless the authorization is terminated or revoked.  Performed at [facility], [place] [address], [place], [place] [address]   Blood culture (routine x 2)     Status: None (Preliminary result)   Collection Time: 02/15/21 10:15 PM   Specimen: BLOOD  Result Value Ref Range Status   Specimen Description   Final    BLOOD RIGHT ANTECUBITAL Performed at [facility] [address] [place], [place] [address]    Special Requests   Final    BOTTLES DRAWN AEROBIC AND ANAEROBIC Blood Culture adequate volume Performed at [facility] [address], [place], [place] [address]    Culture   Final    NO GROWTH 2 DAYS Performed at [facility] [address] [place], [place] [address]    Report Status PENDING  Incomplete  Blood culture (routine x 2)     Status: None (Preliminary result)   Collection Time: 02/16/21 12:00 AM   Specimen: BLOOD  Result Value Ref Range Status   Specimen Description   Final    BLOOD LEFT ANTECUBITAL Performed at [facility] [address], [place], [place] [address]    Special Requests   Final    BOTTLES DRAWN AEROBIC AND ANAEROBIC Blood Culture adequate volume Performed at [facility] [address], [place], [place] [address]    Culture   Final    NO GROWTH 2 DAYS Performed at [facility] [address], [place], [place] [address]    Report Status PENDING  Incomplete     Discharge Instructions:   Discharge  Instructions    Diet - low sodium heart healthy   Complete by: As directed    Discharge instructions   Complete by: As directed    Follow-up with your primary care physician in 1 week.  Continue to take medications as prescribed.  Seek medical attention if you have worsening symptoms.   Increase activity slowly   Complete by: As directed      Allergies as of 02/18/2021      Reactions   Cymbalta [duloxetine Hcl] Nausea And Vomiting, Other (See Comments)   PATIENT STATED THAT HE FELT LIKE HE "WAS GOING TO DIE" Rapid drop in blood pressure; sent him to the ER X2   Neurontin [gabapentin] Other (See Comments)   "Makes me goofy, keeps me off balance, clouds my thinking.."   Keppra [levetiracetam] Nausea And Vomiting  Medication List    STOP taking these medications   cephALEXin 500 MG capsule Commonly known as: KEFLEX   sulfamethoxazole-trimethoprim 800-160 MG tablet Commonly known as: Bactrim DS     TAKE these medications   acetaminophen 325 MG tablet Commonly known as: TYLENOL Take 650 mg by mouth every 6 (six) hours as needed for fever.   albuterol 108 (90 Base) MCG/ACT inhaler Commonly known as: VENTOLIN HFA Inhale 1-2 puffs into the lungs every 4 (four) hours as needed for wheezing or shortness of breath.   ALPHA LIPOIC ACID PO Take 2 tablets by mouth daily.   amiodarone 200 MG tablet Commonly known as: Pacerone Take 1 tablet (200 mg total) by mouth daily.   amitriptyline 25 MG tablet Commonly known as: ELAVIL Take 50 mg by mouth at bedtime.   atorvastatin 10 MG tablet Commonly known as: LIPITOR Take 10 mg by mouth daily.   azithromycin 250 MG tablet Commonly known as: ZITHROMAX Take 1 tablet (250 mg total) by mouth daily for 3 days.   B-12 5000 MCG Caps Take 5,000 mcg by mouth daily.   Co Q 10 100 MG Caps Take 200 mg by mouth daily.   diazepam 5 MG tablet Commonly known as: VALIUM Take 5 mg by mouth at bedtime as needed (sleep).   empagliflozin 10  MG Tabs tablet Commonly known as: JARDIANCE Take 20 mg by mouth daily with breakfast.   furosemide 40 MG tablet Commonly known as: LASIX Take 40 mg by mouth daily.   glimepiride 2 MG tablet Commonly known as: AMARYL Take 2 mg by mouth every morning.   lidocaine 5 % Commonly known as: LIDODERM Place 1 patch onto the skin daily as needed (neuropathy pain). Remove & Discard patch within 12 hours or as directed by MD   metFORMIN 500 MG 24 hr tablet Commonly known as: GLUCOPHAGE-XR Take 500-1,000 mg by mouth See admin instructions. 1,000 mg in the morning and 500 mg in the evening Notes to patient: Due this evening   metoprolol tartrate 50 MG tablet Commonly known as: LOPRESSOR Take 1 tablet by mouth twice daily   nitroGLYCERIN 0.2 mg/hr patch Commonly known as: NITRODUR - Dosed in mg/24 hr Place 1 patch (0.2 mg total) onto the skin daily.   One-A-Day Mens 50+ Advantage Tabs Take 1 tablet by mouth daily.   oxyCODONE-acetaminophen 7.5-325 MG tablet Commonly known as: PERCOCET Take 1 tablet by mouth every 6 (six) hours as needed for moderate pain or severe pain.   pentoxifylline 400 MG CR tablet Commonly known as: TRENTAL Take 1 tablet (400 mg total) by mouth 3 (three) times daily with meals.   pregabalin 300 MG capsule Commonly known as: LYRICA Take 300 mg by mouth 2 (two) times a day.   rivaroxaban 20 MG Tabs tablet Commonly known as: XARELTO Take 20 mg by mouth every evening.   Systane 0.4-0.3 % Gel ophthalmic gel Generic drug: Polyethyl Glycol-Propyl Glycol Place 1 application into both eyes at bedtime.   SYSTANE BALANCE OP Place 1 drop into both eyes 4 (four) times daily.   trolamine salicylate 10 % cream Commonly known as: ASPERCREME Apply 1 application topically at bedtime. Foot pain          Follow-up Information    [name], MD. Schedule an appointment as soon as possible for a visit.   Specialty: Internal Medicine Contact information: [address] [place] [address] [phone]        Daneen Schick  W, MD .   Specialty: Cardiology Contact information: [address] [place] [address] [phone]        Care, [facility] Follow up.   Specialty: Home Health Services Why: Alvis Lemmings will follow you at home.  Contact information: [place] [address] [phone]                Time coordinating discharge: 39 minutes  Signed:  Chavez Rosol  [facility] 02/18/2021, 2:20 PM

## 2021-02-18 NOTE — Progress Notes (Signed)
Mr Flagg ambulated in the hall with the Tech and front wheel walker. He did very well and tolerated it well. IV discontinued. Discharge instructions explained to wife and patient. Questions were answered. Two prescriptions were called into their pharmacy to be picked up. Mr Montminy was discharged to his wife via wheelchair. Next medication doses were written on his discharge instructions.

## 2021-02-18 NOTE — Progress Notes (Signed)
PHARMACIST - PHYSICIAN COMMUNICATION DR:   Louanne Belton CONCERNING: Antibiotic IV to Oral Route Change Policy  RECOMMENDATION: This patient is receiving Azithromycin by the intravenous route.  Based on criteria approved by the Pharmacy and Therapeutics Committee, the antibiotic(s) is/are being converted to the equivalent oral dose form(s).   DESCRIPTION: These criteria include:  Patient being treated for a respiratory tract infection, urinary tract infection, cellulitis or clostridium difficile associated diarrhea if on metronidazole  The patient is not neutropenic and does not exhibit a GI malabsorption state  The patient is eating (either orally or via tube) and/or has been taking other orally administered medications for a least 24 hours  The patient is improving clinically and has a Tmax < 100.5  If you have questions about this conversion, please contact the Pharmacy Department  []   [phone] )  [facility] []   [phone] )  [facility] []   [phone] )  [facility] []   [phone] )  [facility] [x]   [phone] )  [place], PharmD, BCPS 02/18/2021 7:41 AM

## 2021-02-19 ENCOUNTER — Encounter: Payer: Self-pay | Admitting: Orthopedic Surgery

## 2021-02-19 ENCOUNTER — Ambulatory Visit (INDEPENDENT_AMBULATORY_CARE_PROVIDER_SITE_OTHER): Payer: Medicare Other | Admitting: Orthopedic Surgery

## 2021-02-19 ENCOUNTER — Ambulatory Visit: Payer: Medicare Other | Admitting: Orthopedic Surgery

## 2021-02-19 VITALS — Ht 73.0 in | Wt 197.0 lb

## 2021-02-19 DIAGNOSIS — L97929 Non-pressure chronic ulcer of unspecified part of left lower leg with unspecified severity: Secondary | ICD-10-CM | POA: Diagnosis not present

## 2021-02-19 DIAGNOSIS — I87332 Chronic venous hypertension (idiopathic) with ulcer and inflammation of left lower extremity: Secondary | ICD-10-CM

## 2021-02-20 ENCOUNTER — Encounter: Payer: Self-pay | Admitting: Orthopedic Surgery

## 2021-02-20 NOTE — Progress Notes (Signed)
Office Visit Note   Patient: [name]           Date of Birth: 1941-03-04           MRN: [id number] Visit Date: 02/19/2021              Requested by: [name], MD [address] [place],  [place] [address] PCP: [name], MD  Chief Complaint  Patient presents with  . Left Leg - Follow-up      HPI:  Patient is an 80 year old gentleman who presents in follow-up for venous ulceration and cellulitis left leg.  Patient states he was recently hospitalized for bronchitis for 3 days.  Assessment & Plan: Visit Diagnoses:  1. Chronic venous hypertension (idiopathic) with ulcer and inflammation of left lower extremity (HCC)     Plan: Continue with the compression stocking  Follow-Up Instructions: Return if symptoms worsen or fail to improve.   Ortho Exam  Patient is alert, oriented, no adenopathy, well-dressed, normal affect, normal respiratory effort. Examination the left leg cellulitis has resolved the ulcer is healed.  Imaging: No results found. No images are attached to the encounter.  Labs: Lab Results  Component Value Date   HGBA1C 7.8 (H) 08/07/2020   HGBA1C 8.4 (H) 02/09/2020   HGBA1C 7.3 (H) 07/04/2019   REPTSTATUS PENDING 02/16/2021   CULT  02/16/2021    NO GROWTH 4 DAYS Performed at [facility], [place] [address], [place], [place] [address]      Lab Results  Component Value Date   ALBUMIN 3.5 02/16/2021   ALBUMIN 3.6 02/15/2021   ALBUMIN 3.9 12/15/2020   PREALBUMIN 8.4 (L) 04/12/2008    Lab Results  Component Value Date   MG 2.2 02/18/2021   MG 2.2 02/16/2021   MG 1.8 02/17/2020   No results found for: [facility]  Lab Results  Component Value Date   PREALBUMIN 8.4 (L) 04/12/2008   CBC EXTENDED Latest Ref Rng & Units 02/18/2021 02/16/2021 02/15/2021  WBC 4.0 - 10.5 K/uL 4.5 6.6 5.9  RBC 4.22 - 5.81 MIL/uL 3.79(L) 3.80(L) 3.75(L)  HGB 13.0 - 17.0 g/dL 12.1(L) 12.2(L) 12.0(L)  HCT 39.0 - 52.0 % 35.5(L) 36.4(L) 35.5(L)   PLT 150 - 400 K/uL 174 153 165  NEUTROABS 1.7 - 7.7 K/uL - 4.1 4.2  LYMPHSABS 0.7 - 4.0 K/uL - 0.7 0.4(L)     Body mass index is 25.99 kg/m.  Orders:  No orders of the defined types were placed in this encounter.  No orders of the defined types were placed in this encounter.    Procedures: No procedures performed  Clinical Data: No additional findings.  ROS:  All other systems negative, except as noted in the HPI. Review of Systems  Objective: Vital Signs: Ht 6\' 1"  (1.854 m)   Wt 197 lb (89.4 kg)   BMI 25.99 kg/m   Specialty Comments:  No specialty comments available.  PMFS History: Patient Active Problem List   Diagnosis Date Noted  . Acute bronchitis 02/16/2021  . SOB (shortness of breath) 02/16/2021  . PAF (paroxysmal atrial fibrillation) ([place])   . Cellulitis of toe of right foot   . Diabetic foot infection ([place]) 08/06/2020  . Osteomyelitis ([place]) 08/06/2020  . Acquired thrombophilia ([place]) 08/06/2020  . Ileus ([place]) 02/13/2020  . Acute colitis 02/08/2020  . Hypokalemia 07/08/2019  . Acute metabolic encephalopathy   . Orthostatic dizziness   . Postural dizziness with near syncope 07/04/2019  . Abdominal distention,  non-gaseous 07/04/2019  . Change in mental status 07/04/2019  . Small bowel obstruction ([place]) 07/04/2019  . Postural dizziness with presyncope 05/09/2018  . Hypotension 05/08/2018  . On amiodarone therapy 08/26/2017  . Near syncope 06/11/2017  . Hypoxia   . Pneumonia of left lower lobe due to infectious organism   . Gastroenteritis and colitis, viral 04/27/2016  . Orthostasis 04/26/2016  . AKI (acute kidney injury) ([place]) 04/26/2016  . Lumbar radiculopathy, chronic 01/16/2016  . Hematoma of left kidney 11/01/2015  . Hyperlipidemia 12/12/2014  . SIRS (systemic inflammatory response syndrome) ([place]) 12/12/2014  . Influenza due to identified novel influenza A virus with other respiratory manifestations 11/07/2013  . Chronic anticoagulation  09/08/2013    Class: Chronic  . Coronary artery disease involving native coronary artery of native heart with angina pectoris ([place]) 08/16/2013    Class: Chronic  . Chronic combined systolic and diastolic heart failure (HCC) 08/16/2013    Class: Chronic  . Paroxysmal atrial fibrillation with RVR ([place]) 08/13/2013  . Paralytic ileus ([place]) 05/07/2012  . Nausea and vomiting 01/01/2012  . DM (diabetes mellitus), type 2, uncontrolled w/neurologic complication ([place]) 27/03/2375  . Hypertension   . AAA (abdominal aortic aneurysm) ([place])   . Acid reflux   . Sleep apnea    Past Medical History:  Diagnosis Date  . AAA (abdominal aortic aneurysm) ([place])    a. s/p repair 2009.  [name] Acid reflux    takes Prilosec and Protonix daily  . Arthritis    BacK  . CAD in native artery    a. a. prior inf MI 1993. b. PCI to LAD 05/1997. c. recurrent inferolateral MI complicated by V. fib arrest in 04/1998, prior PCI to OM1. d. 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  [name] Chronic combined systolic and diastolic CHF (congestive heart failure) ([place])   . Complication of anesthesia    -years ago hair fell out.;ileus after 2 of his surgeries  . Dry skin   . Foot drop, bilateral   . Hypercholesteremia   . Hypertension   . Ileus ([place])    After AAA  . Incisional hernia    "small, from AAA"  . Ischemic cardiomyopathy   . MI (myocardial infarction) ([place])   . 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 OSA on CPAP    uses CPAP--sleep study done at least 14yrs ago  . PAF (paroxysmal atrial fibrillation) ([place])   . Pain    back pain chronic- seen at pain clinic  . Plantar fasciitis    bilatetral  . Thrombocytopenia ([place])   . Type II diabetes mellitus (HCC)     Family History  Problem Relation Age of Onset  . Diabetes Mother   . Coronary artery disease Mother   . Diabetes Father   . Arthritis Father   . Rheum arthritis Father   . Coronary artery disease Father     Past Surgical History:  Procedure Laterality Date  . ABDOMINAL  AORTIC ANEURYSM REPAIR    . BACK SURGERY  2012-2013 X 3   Miminal Invasive x 3in [place].  [name] CARDIAC CATHETERIZATION  2009/2012  . CARDIOVERSION N/A 04/12/2015   Procedure: CARDIOVERSION;  Surgeon: Sueanne Margarita, MD;  Location: [facility];  Service: Cardiovascular;  Laterality: N/A;  . CARDIOVERSION N/A 12/26/2016   Procedure: CARDIOVERSION;  Surgeon: Sueanne Margarita, MD;  Location: [facility];  Service: Cardiovascular;  Laterality: N/A;  . COLONOSCOPY    . CORONARY ANGIOPLASTY WITH STENT PLACEMENT  1992; 1997  . LAPAROSCOPIC CHOLECYSTECTOMY    . LUMBAR LAMINECTOMY/DECOMPRESSION MICRODISCECTOMY Right 01/16/2016   Procedure: Right Lumbar five-Sacral one Laminectomy;  Surgeon: Kristeen Miss, MD;  Location: [facility];  Service: Neurosurgery;  Laterality: Right;  Right L5-S1 Laminectomy   Social History   Occupational History  . Not on file  Tobacco Use  . Smoking status: Former Smoker    Packs/day: 1.00    Years: 33.00    Pack years: 33.00    Types: Cigarettes    Quit date: 01/01/1992    Years since quitting: 29.1  . Smokeless tobacco: Never Used  Vaping Use  . Vaping Use: Never used  Substance and Sexual Activity  . Alcohol use: Yes    Comment: 6/30;2017 "GLASS OF WINE Q COUPLE MONTHS, IF THAT"  . Drug use: No  . Sexual activity: Yes

## 2021-02-21 DIAGNOSIS — J209 Acute bronchitis, unspecified: Secondary | ICD-10-CM | POA: Diagnosis not present

## 2021-02-21 DIAGNOSIS — I130 Hypertensive heart and chronic kidney disease with heart failure and stage 1 through stage 4 chronic kidney disease, or unspecified chronic kidney disease: Secondary | ICD-10-CM | POA: Diagnosis not present

## 2021-02-21 DIAGNOSIS — J439 Emphysema, unspecified: Secondary | ICD-10-CM | POA: Diagnosis not present

## 2021-02-21 DIAGNOSIS — I5042 Chronic combined systolic (congestive) and diastolic (congestive) heart failure: Secondary | ICD-10-CM | POA: Diagnosis not present

## 2021-02-21 DIAGNOSIS — J9601 Acute respiratory failure with hypoxia: Secondary | ICD-10-CM | POA: Diagnosis not present

## 2021-02-21 LAB — CULTURE, BLOOD (ROUTINE X 2)
Culture: NO GROWTH
Culture: NO GROWTH
Special Requests: ADEQUATE
Special Requests: ADEQUATE

## 2021-02-22 DIAGNOSIS — I5042 Chronic combined systolic (congestive) and diastolic (congestive) heart failure: Secondary | ICD-10-CM | POA: Diagnosis not present

## 2021-02-22 DIAGNOSIS — J209 Acute bronchitis, unspecified: Secondary | ICD-10-CM | POA: Diagnosis not present

## 2021-02-22 DIAGNOSIS — J439 Emphysema, unspecified: Secondary | ICD-10-CM | POA: Diagnosis not present

## 2021-02-22 DIAGNOSIS — J9601 Acute respiratory failure with hypoxia: Secondary | ICD-10-CM | POA: Diagnosis not present

## 2021-02-22 DIAGNOSIS — I130 Hypertensive heart and chronic kidney disease with heart failure and stage 1 through stage 4 chronic kidney disease, or unspecified chronic kidney disease: Secondary | ICD-10-CM | POA: Diagnosis not present

## 2021-02-23 DIAGNOSIS — J209 Acute bronchitis, unspecified: Secondary | ICD-10-CM | POA: Diagnosis not present

## 2021-02-23 DIAGNOSIS — E1140 Type 2 diabetes mellitus with diabetic neuropathy, unspecified: Secondary | ICD-10-CM | POA: Diagnosis not present

## 2021-02-27 DIAGNOSIS — J209 Acute bronchitis, unspecified: Secondary | ICD-10-CM | POA: Diagnosis not present

## 2021-02-27 DIAGNOSIS — I130 Hypertensive heart and chronic kidney disease with heart failure and stage 1 through stage 4 chronic kidney disease, or unspecified chronic kidney disease: Secondary | ICD-10-CM | POA: Diagnosis not present

## 2021-02-27 DIAGNOSIS — J439 Emphysema, unspecified: Secondary | ICD-10-CM | POA: Diagnosis not present

## 2021-02-27 DIAGNOSIS — I5042 Chronic combined systolic (congestive) and diastolic (congestive) heart failure: Secondary | ICD-10-CM | POA: Diagnosis not present

## 2021-02-27 DIAGNOSIS — J9601 Acute respiratory failure with hypoxia: Secondary | ICD-10-CM | POA: Diagnosis not present

## 2021-03-22 DIAGNOSIS — I5042 Chronic combined systolic (congestive) and diastolic (congestive) heart failure: Secondary | ICD-10-CM | POA: Diagnosis not present

## 2021-03-22 DIAGNOSIS — I25119 Atherosclerotic heart disease of native coronary artery with unspecified angina pectoris: Secondary | ICD-10-CM | POA: Diagnosis not present

## 2021-03-22 DIAGNOSIS — E7800 Pure hypercholesterolemia, unspecified: Secondary | ICD-10-CM | POA: Diagnosis not present

## 2021-03-22 DIAGNOSIS — I10 Essential (primary) hypertension: Secondary | ICD-10-CM | POA: Diagnosis not present

## 2021-03-22 DIAGNOSIS — K219 Gastro-esophageal reflux disease without esophagitis: Secondary | ICD-10-CM | POA: Diagnosis not present

## 2021-03-22 DIAGNOSIS — E1165 Type 2 diabetes mellitus with hyperglycemia: Secondary | ICD-10-CM | POA: Diagnosis not present

## 2021-03-22 DIAGNOSIS — E1140 Type 2 diabetes mellitus with diabetic neuropathy, unspecified: Secondary | ICD-10-CM | POA: Diagnosis not present

## 2021-03-22 DIAGNOSIS — I480 Paroxysmal atrial fibrillation: Secondary | ICD-10-CM | POA: Diagnosis not present

## 2021-03-28 ENCOUNTER — Inpatient Hospital Stay (HOSPITAL_COMMUNITY)
Admission: EM | Admit: 2021-03-28 | Discharge: 2021-04-06 | DRG: 084 | Disposition: A | Discharge status: Rehabilitation Facility | Payer: Medicare Other | Attending: Physician Assistant | Admitting: Physician Assistant

## 2021-03-28 ENCOUNTER — Other Ambulatory Visit: Payer: Self-pay

## 2021-03-28 ENCOUNTER — Emergency Department (HOSPITAL_COMMUNITY): Payer: Medicare Other

## 2021-03-28 DIAGNOSIS — S065X9A Traumatic subdural hemorrhage with loss of consciousness of unspecified duration, initial encounter: Secondary | ICD-10-CM

## 2021-03-28 DIAGNOSIS — S0292XA Unspecified fracture of facial bones, initial encounter for closed fracture: Secondary | ICD-10-CM

## 2021-03-28 DIAGNOSIS — S27809S Unspecified injury of diaphragm, sequela: Secondary | ICD-10-CM

## 2021-03-28 DIAGNOSIS — R58 Hemorrhage, not elsewhere classified: Secondary | ICD-10-CM

## 2021-03-28 DIAGNOSIS — E1122 Type 2 diabetes mellitus with diabetic chronic kidney disease: Secondary | ICD-10-CM | POA: Diagnosis not present

## 2021-03-28 DIAGNOSIS — E876 Hypokalemia: Secondary | ICD-10-CM | POA: Diagnosis present

## 2021-03-28 DIAGNOSIS — E1165 Type 2 diabetes mellitus with hyperglycemia: Secondary | ICD-10-CM | POA: Diagnosis not present

## 2021-03-28 DIAGNOSIS — Z781 Physical restraint status: Secondary | ICD-10-CM | POA: Diagnosis not present

## 2021-03-28 DIAGNOSIS — R918 Other nonspecific abnormal finding of lung field: Secondary | ICD-10-CM | POA: Diagnosis not present

## 2021-03-28 DIAGNOSIS — R0902 Hypoxemia: Secondary | ICD-10-CM | POA: Diagnosis not present

## 2021-03-28 DIAGNOSIS — Y92410 Unspecified street and highway as the place of occurrence of the external cause: Secondary | ICD-10-CM | POA: Diagnosis not present

## 2021-03-28 DIAGNOSIS — R4020 Unspecified coma: Secondary | ICD-10-CM | POA: Diagnosis not present

## 2021-03-28 DIAGNOSIS — S066X9S Traumatic subarachnoid hemorrhage with loss of consciousness of unspecified duration, sequela: Secondary | ICD-10-CM | POA: Diagnosis not present

## 2021-03-28 DIAGNOSIS — S065X0A Traumatic subdural hemorrhage without loss of consciousness, initial encounter: Secondary | ICD-10-CM | POA: Diagnosis not present

## 2021-03-28 DIAGNOSIS — N281 Cyst of kidney, acquired: Secondary | ICD-10-CM | POA: Diagnosis not present

## 2021-03-28 DIAGNOSIS — Z743 Need for continuous supervision: Secondary | ICD-10-CM | POA: Diagnosis not present

## 2021-03-28 DIAGNOSIS — I129 Hypertensive chronic kidney disease with stage 1 through stage 4 chronic kidney disease, or unspecified chronic kidney disease: Secondary | ICD-10-CM | POA: Diagnosis not present

## 2021-03-28 DIAGNOSIS — Z7901 Long term (current) use of anticoagulants: Secondary | ICD-10-CM | POA: Diagnosis not present

## 2021-03-28 DIAGNOSIS — I714 Abdominal aortic aneurysm, without rupture: Secondary | ICD-10-CM | POA: Diagnosis not present

## 2021-03-28 DIAGNOSIS — S06890A Other specified intracranial injury without loss of consciousness, initial encounter: Secondary | ICD-10-CM | POA: Diagnosis not present

## 2021-03-28 DIAGNOSIS — Z79899 Other long term (current) drug therapy: Secondary | ICD-10-CM

## 2021-03-28 DIAGNOSIS — S02842S Fracture of lateral orbital wall, left side, sequela: Secondary | ICD-10-CM | POA: Diagnosis not present

## 2021-03-28 DIAGNOSIS — S0232XA Fracture of orbital floor, left side, initial encounter for closed fracture: Secondary | ICD-10-CM | POA: Diagnosis present

## 2021-03-28 DIAGNOSIS — E119 Type 2 diabetes mellitus without complications: Secondary | ICD-10-CM | POA: Diagnosis not present

## 2021-03-28 DIAGNOSIS — I2510 Atherosclerotic heart disease of native coronary artery without angina pectoris: Secondary | ICD-10-CM | POA: Diagnosis not present

## 2021-03-28 DIAGNOSIS — S0240DA Maxillary fracture, left side, initial encounter for closed fracture: Secondary | ICD-10-CM | POA: Diagnosis present

## 2021-03-28 DIAGNOSIS — S065XAA Traumatic subdural hemorrhage with loss of consciousness status unknown, initial encounter: Secondary | ICD-10-CM

## 2021-03-28 DIAGNOSIS — I10 Essential (primary) hypertension: Secondary | ICD-10-CM | POA: Diagnosis not present

## 2021-03-28 DIAGNOSIS — Z7984 Long term (current) use of oral hypoglycemic drugs: Secondary | ICD-10-CM | POA: Diagnosis not present

## 2021-03-28 DIAGNOSIS — Z20822 Contact with and (suspected) exposure to covid-19: Secondary | ICD-10-CM | POA: Diagnosis not present

## 2021-03-28 DIAGNOSIS — N189 Chronic kidney disease, unspecified: Secondary | ICD-10-CM | POA: Diagnosis present

## 2021-03-28 DIAGNOSIS — R9431 Abnormal electrocardiogram [ECG] [EKG]: Secondary | ICD-10-CM | POA: Diagnosis not present

## 2021-03-28 DIAGNOSIS — K5901 Slow transit constipation: Secondary | ICD-10-CM | POA: Diagnosis not present

## 2021-03-28 DIAGNOSIS — G936 Cerebral edema: Secondary | ICD-10-CM | POA: Diagnosis not present

## 2021-03-28 DIAGNOSIS — S0240FA Zygomatic fracture, left side, initial encounter for closed fracture: Secondary | ICD-10-CM | POA: Diagnosis not present

## 2021-03-28 DIAGNOSIS — S065X0S Traumatic subdural hemorrhage without loss of consciousness, sequela: Secondary | ICD-10-CM | POA: Diagnosis not present

## 2021-03-28 DIAGNOSIS — S066X0A Traumatic subarachnoid hemorrhage without loss of consciousness, initial encounter: Secondary | ICD-10-CM | POA: Diagnosis not present

## 2021-03-28 DIAGNOSIS — G629 Polyneuropathy, unspecified: Secondary | ICD-10-CM | POA: Diagnosis not present

## 2021-03-28 DIAGNOSIS — I499 Cardiac arrhythmia, unspecified: Secondary | ICD-10-CM | POA: Diagnosis not present

## 2021-03-28 DIAGNOSIS — I4891 Unspecified atrial fibrillation: Secondary | ICD-10-CM | POA: Diagnosis present

## 2021-03-28 DIAGNOSIS — N200 Calculus of kidney: Secondary | ICD-10-CM | POA: Diagnosis not present

## 2021-03-28 DIAGNOSIS — J439 Emphysema, unspecified: Secondary | ICD-10-CM | POA: Diagnosis not present

## 2021-03-28 DIAGNOSIS — E1142 Type 2 diabetes mellitus with diabetic polyneuropathy: Secondary | ICD-10-CM | POA: Diagnosis not present

## 2021-03-28 DIAGNOSIS — M792 Neuralgia and neuritis, unspecified: Secondary | ICD-10-CM | POA: Diagnosis not present

## 2021-03-28 DIAGNOSIS — S066X9A Traumatic subarachnoid hemorrhage with loss of consciousness of unspecified duration, initial encounter: Secondary | ICD-10-CM | POA: Diagnosis not present

## 2021-03-28 DIAGNOSIS — Z041 Encounter for examination and observation following transport accident: Secondary | ICD-10-CM | POA: Diagnosis not present

## 2021-03-28 DIAGNOSIS — S0012XA Contusion of left eyelid and periocular area, initial encounter: Secondary | ICD-10-CM | POA: Diagnosis not present

## 2021-03-28 DIAGNOSIS — S199XXA Unspecified injury of neck, initial encounter: Secondary | ICD-10-CM | POA: Diagnosis not present

## 2021-03-28 DIAGNOSIS — S065X3S Traumatic subdural hemorrhage with loss of consciousness of 1 hour to 5 hours 59 minutes, sequela: Secondary | ICD-10-CM | POA: Diagnosis not present

## 2021-03-28 DIAGNOSIS — I700 Atherosclerosis of aorta: Secondary | ICD-10-CM | POA: Diagnosis not present

## 2021-03-28 DIAGNOSIS — J9811 Atelectasis: Secondary | ICD-10-CM | POA: Diagnosis not present

## 2021-03-28 DIAGNOSIS — I482 Chronic atrial fibrillation, unspecified: Secondary | ICD-10-CM | POA: Diagnosis not present

## 2021-03-28 DIAGNOSIS — S0003XA Contusion of scalp, initial encounter: Secondary | ICD-10-CM | POA: Diagnosis not present

## 2021-03-28 DIAGNOSIS — Z888 Allergy status to other drugs, medicaments and biological substances status: Secondary | ICD-10-CM

## 2021-03-28 DIAGNOSIS — S06310A Contusion and laceration of right cerebrum without loss of consciousness, initial encounter: Secondary | ICD-10-CM | POA: Diagnosis not present

## 2021-03-28 LAB — COMPREHENSIVE METABOLIC PANEL
ALT: 36 U/L (ref 0–44)
AST: 35 U/L (ref 15–41)
Albumin: 3.6 g/dL (ref 3.5–5.0)
Alkaline Phosphatase: 54 U/L (ref 38–126)
Anion gap: 12 (ref 5–15)
BUN: 34 mg/dL — ABNORMAL HIGH (ref 8–23)
CO2: 26 mmol/L (ref 22–32)
Calcium: 8.8 mg/dL — ABNORMAL LOW (ref 8.9–10.3)
Chloride: 97 mmol/L — ABNORMAL LOW (ref 98–111)
Creatinine, Ser: 1.64 mg/dL — ABNORMAL HIGH (ref 0.61–1.24)
GFR, Estimated: 42 mL/min — ABNORMAL LOW (ref >60)
Glucose, Bld: 286 mg/dL — ABNORMAL HIGH (ref 70–99)
Potassium: 4.4 mmol/L (ref 3.5–5.1)
Sodium: 135 mmol/L (ref 135–145)
Total Bilirubin: 0.6 mg/dL (ref 0.3–1.2)
Total Protein: 6.9 g/dL (ref 6.5–8.1)

## 2021-03-28 LAB — CBC
HCT: 35.4 % — ABNORMAL LOW (ref 39.0–52.0)
Hemoglobin: 11.8 g/dL — ABNORMAL LOW (ref 13.0–17.0)
MCH: 32.2 pg (ref 26.0–34.0)
MCHC: 33.3 g/dL (ref 30.0–36.0)
MCV: 96.5 fL (ref 80.0–100.0)
Platelets: 155 10*3/uL (ref 150–400)
RBC: 3.67 MIL/uL — ABNORMAL LOW (ref 4.22–5.81)
RDW: 14.6 % (ref 11.5–15.5)
WBC: 4.6 10*3/uL (ref 4.0–10.5)
nRBC: 0 % (ref 0.0–0.2)

## 2021-03-28 LAB — URINALYSIS, ROUTINE W REFLEX MICROSCOPIC
Bacteria, UA: NONE SEEN
Bilirubin Urine: NEGATIVE
Glucose, UA: >=500 mg/dL — AB
Ketones, ur: NEGATIVE mg/dL
Leukocytes,Ua: NEGATIVE
Nitrite: NEGATIVE
Protein, ur: NEGATIVE mg/dL
Specific Gravity, Urine: 1.023 (ref 1.005–1.030)
pH: 6 (ref 5.0–8.0)

## 2021-03-28 LAB — I-STAT CHEM 8, ED
BUN: 36 mg/dL — ABNORMAL HIGH (ref 8–23)
Calcium, Ion: 1.17 mmol/L (ref 1.15–1.40)
Chloride: 100 mmol/L (ref 98–111)
Creatinine, Ser: 1.7 mg/dL — ABNORMAL HIGH (ref 0.61–1.24)
Glucose, Bld: 276 mg/dL — ABNORMAL HIGH (ref 70–99)
HCT: 34 % — ABNORMAL LOW (ref 39.0–52.0)
Hemoglobin: 11.6 g/dL — ABNORMAL LOW (ref 13.0–17.0)
Potassium: 4.5 mmol/L (ref 3.5–5.1)
Sodium: 137 mmol/L (ref 135–145)
TCO2: 28 mmol/L (ref 22–32)

## 2021-03-28 LAB — RESP PANEL BY RT-PCR (FLU A&B, COVID) ARPGX2
Influenza A by PCR: NEGATIVE
Influenza B by PCR: NEGATIVE
SARS Coronavirus 2 by RT PCR: NEGATIVE

## 2021-03-28 LAB — PROTIME-INR
INR: 1.3 — ABNORMAL HIGH (ref 0.8–1.2)
Prothrombin Time: 16.5 seconds — ABNORMAL HIGH (ref 11.4–15.2)

## 2021-03-28 LAB — SAMPLE TO BLOOD BANK

## 2021-03-28 LAB — LACTIC ACID, PLASMA: Lactic Acid, Venous: 1.6 mmol/L (ref 0.5–1.9)

## 2021-03-28 LAB — ETHANOL: Alcohol, Ethyl (B): <10 mg/dL (ref <10)

## 2021-03-28 MED ORDER — IOHEXOL 300 MG/ML  SOLN
100.0000 mL | Freq: Once | INTRAMUSCULAR | Status: AC | PRN
  Administered 2021-03-28: 100 mL via INTRAVENOUS

## 2021-03-28 MED ORDER — PROTHROMBIN COMPLEX CONC HUMAN 500 UNITS IV KIT
4319.0000 [IU] | PACK | Status: AC
  Administered 2021-03-28: 4319 [IU] via INTRAVENOUS
  Filled 2021-03-28 (×2): qty 4319

## 2021-03-28 MED ORDER — TETRACAINE HCL 0.5 % OP SOLN
2.0000 [drp] | Freq: Once | OPHTHALMIC | Status: AC
  Administered 2021-03-28: 2 [drp] via OPHTHALMIC

## 2021-03-28 MED ORDER — PANTOPRAZOLE SODIUM 40 MG PO TBEC: 40.0000 mg | DELAYED_RELEASE_TABLET | Freq: Every day | ORAL | Status: DC

## 2021-03-28 MED ORDER — PANTOPRAZOLE SODIUM 40 MG IV SOLR
40.0000 mg | Freq: Every day | INTRAVENOUS | Status: DC
  Administered 2021-03-29: 40 mg via INTRAVENOUS
  Filled 2021-03-28: qty 40

## 2021-03-28 MED ORDER — LEVETIRACETAM IN NACL 500 MG/100ML IV SOLN
500.0000 mg | Freq: Two times a day (BID) | INTRAVENOUS | Status: DC
  Administered 2021-03-28 – 2021-03-29 (×3): 500 mg via INTRAVENOUS
  Filled 2021-03-28 (×3): qty 100

## 2021-03-28 MED ORDER — HYDROMORPHONE HCL 1 MG/ML IJ SOLN
1.0000 mg | INTRAMUSCULAR | Status: DC | PRN
  Administered 2021-03-29 (×5): 1 mg via INTRAVENOUS
  Filled 2021-03-28 (×5): qty 1

## 2021-03-28 MED ORDER — DEXTROSE-NACL 5-0.9 % IV SOLN: INTRAVENOUS | Status: DC

## 2021-03-28 MED ORDER — LEVETIRACETAM IN NACL 500 MG/100ML IV SOLN: 500.0000 mg | Freq: Two times a day (BID) | INTRAVENOUS | Status: DC

## 2021-03-28 MED ORDER — ONDANSETRON HCL 4 MG/2ML IJ SOLN
4.0000 mg | Freq: Four times a day (QID) | INTRAMUSCULAR | Status: DC | PRN
  Administered 2021-03-29: 4 mg via INTRAVENOUS
  Filled 2021-03-28: qty 2

## 2021-03-28 MED ORDER — ONDANSETRON 4 MG PO TBDP: 4.0000 mg | ORAL_TABLET | Freq: Four times a day (QID) | ORAL | Status: DC | PRN

## 2021-03-28 NOTE — ED Notes (Signed)
C-collar removed by MD Alvino Chapel

## 2021-03-28 NOTE — Progress Notes (Signed)
   03/28/21 2000  Clinical Encounter Type  Visited With Patient not available  Visit Type Trauma  Referral From Nurse  Consult/Referral To Chaplain  The chaplain responded to trauma 2 page. The patient is being assessed. The patient does not have any family present at this time. No chaplain services are needed at this time. The chaplain will follow up as needed.

## 2021-03-28 NOTE — ED Notes (Signed)
Pts wife returned call T[place] [phone]
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
## 2021-03-28 NOTE — ED Notes (Signed)
Suctioned bright red blood from mouth
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
## 2021-03-28 NOTE — ED Notes (Signed)
Family updated as to patient's status. Wife, Fraser Din, contacted via phone and spoke with patient.
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
## 2021-03-28 NOTE — ED Notes (Signed)
Patient transported to CT 
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
## 2021-03-29 ENCOUNTER — Inpatient Hospital Stay (HOSPITAL_COMMUNITY): Payer: Medicare Other

## 2021-03-29 LAB — BASIC METABOLIC PANEL
Anion gap: 12 (ref 5–15)
BUN: 32 mg/dL — ABNORMAL HIGH (ref 8–23)
CO2: 24 mmol/L (ref 22–32)
Calcium: 9 mg/dL (ref 8.9–10.3)
Chloride: 101 mmol/L (ref 98–111)
Creatinine, Ser: 1.3 mg/dL — ABNORMAL HIGH (ref 0.61–1.24)
GFR, Estimated: 55 mL/min — ABNORMAL LOW (ref >60)
Glucose, Bld: 253 mg/dL — ABNORMAL HIGH (ref 70–99)
Potassium: 4.5 mmol/L (ref 3.5–5.1)
Sodium: 137 mmol/L (ref 135–145)

## 2021-03-29 LAB — GLUCOSE, CAPILLARY
Glucose-Capillary: 226 mg/dL — ABNORMAL HIGH (ref 70–99)
Glucose-Capillary: 207 mg/dL — ABNORMAL HIGH (ref 70–99)
Glucose-Capillary: 185 mg/dL — ABNORMAL HIGH (ref 70–99)
Glucose-Capillary: 196 mg/dL — ABNORMAL HIGH (ref 70–99)
Glucose-Capillary: 198 mg/dL — ABNORMAL HIGH (ref 70–99)

## 2021-03-29 LAB — MRSA PCR SCREENING: MRSA by PCR: NEGATIVE

## 2021-03-29 LAB — CBC
HCT: 33.6 % — ABNORMAL LOW (ref 39.0–52.0)
HCT: 30 % — ABNORMAL LOW (ref 39.0–52.0)
Hemoglobin: 11.3 g/dL — ABNORMAL LOW (ref 13.0–17.0)
Hemoglobin: 9.8 g/dL — ABNORMAL LOW (ref 13.0–17.0)
MCH: 32 pg (ref 26.0–34.0)
MCH: 31.5 pg (ref 26.0–34.0)
MCHC: 33.6 g/dL (ref 30.0–36.0)
MCHC: 32.7 g/dL (ref 30.0–36.0)
MCV: 95.2 fL (ref 80.0–100.0)
MCV: 96.5 fL (ref 80.0–100.0)
Platelets: 143 10*3/uL — ABNORMAL LOW (ref 150–400)
Platelets: 144 10*3/uL — ABNORMAL LOW (ref 150–400)
RBC: 3.53 MIL/uL — ABNORMAL LOW (ref 4.22–5.81)
RBC: 3.11 MIL/uL — ABNORMAL LOW (ref 4.22–5.81)
RDW: 14.6 % (ref 11.5–15.5)
RDW: 14.9 % (ref 11.5–15.5)
WBC: 9.7 10*3/uL (ref 4.0–10.5)
WBC: 7.9 10*3/uL (ref 4.0–10.5)
nRBC: 0 % (ref 0.0–0.2)
nRBC: 0 % (ref 0.0–0.2)

## 2021-03-29 MED ORDER — POTASSIUM CHLORIDE IN NACL 20-0.9 MEQ/L-% IV SOLN
INTRAVENOUS | Status: DC
  Filled 2021-03-29 (×2): qty 1000

## 2021-03-29 MED ORDER — AMIODARONE HCL 200 MG PO TABS
200.0000 mg | ORAL_TABLET | Freq: Every day | ORAL | Status: DC
  Administered 2021-03-29 – 2021-04-06 (×9): 200 mg via ORAL
  Filled 2021-03-29 (×9): qty 1

## 2021-03-29 MED ORDER — INSULIN ASPART 100 UNIT/ML IJ SOLN
0.0000 [IU] | INTRAMUSCULAR | Status: DC
  Administered 2021-03-29: 5 [IU] via SUBCUTANEOUS
  Administered 2021-03-29: 3 [IU] via SUBCUTANEOUS
  Administered 2021-03-29: 5 [IU] via SUBCUTANEOUS
  Administered 2021-03-29 – 2021-03-30 (×2): 3 [IU] via SUBCUTANEOUS
  Administered 2021-03-30 (×3): 5 [IU] via SUBCUTANEOUS
  Administered 2021-03-30: 3 [IU] via SUBCUTANEOUS
  Administered 2021-03-30: 8 [IU] via SUBCUTANEOUS
  Administered 2021-03-31 (×3): 3 [IU] via SUBCUTANEOUS
  Administered 2021-03-31 (×3): 2 [IU] via SUBCUTANEOUS
  Administered 2021-04-01 (×6): 3 [IU] via SUBCUTANEOUS
  Administered 2021-04-02: 2 [IU] via SUBCUTANEOUS
  Administered 2021-04-02: 3 [IU] via SUBCUTANEOUS
  Administered 2021-04-02 (×3): 2 [IU] via SUBCUTANEOUS
  Administered 2021-04-03: 5 [IU] via SUBCUTANEOUS
  Administered 2021-04-03: 2 [IU] via SUBCUTANEOUS
  Administered 2021-04-03: 5 [IU] via SUBCUTANEOUS
  Administered 2021-04-03: 8 [IU] via SUBCUTANEOUS
  Administered 2021-04-03: 2 [IU] via SUBCUTANEOUS
  Administered 2021-04-03: 5 [IU] via SUBCUTANEOUS
  Administered 2021-04-04: 2 [IU] via SUBCUTANEOUS
  Administered 2021-04-04: 8 [IU] via SUBCUTANEOUS
  Administered 2021-04-04: 5 [IU] via SUBCUTANEOUS
  Administered 2021-04-04: 3 [IU] via SUBCUTANEOUS
  Administered 2021-04-04: 5 [IU] via SUBCUTANEOUS
  Administered 2021-04-04: 2 [IU] via SUBCUTANEOUS
  Administered 2021-04-05: 3 [IU] via SUBCUTANEOUS
  Administered 2021-04-05 (×2): 5 [IU] via SUBCUTANEOUS

## 2021-03-29 MED ORDER — AMOXICILLIN 500 MG PO CAPS
500.0000 mg | ORAL_CAPSULE | Freq: Three times a day (TID) | ORAL | Status: AC
  Administered 2021-03-29 – 2021-04-05 (×21): 500 mg via ORAL
  Filled 2021-03-29 (×22): qty 1

## 2021-03-29 MED ORDER — PREGABALIN 100 MG PO CAPS
100.0000 mg | ORAL_CAPSULE | Freq: Two times a day (BID) | ORAL | Status: DC
  Administered 2021-03-29 – 2021-04-06 (×17): 100 mg via ORAL
  Filled 2021-03-29 (×5): qty 1
  Filled 2021-03-29 (×2): qty 2
  Filled 2021-03-29 (×4): qty 1
  Filled 2021-03-29: qty 2
  Filled 2021-03-29 (×2): qty 1
  Filled 2021-03-29: qty 2
  Filled 2021-03-29 (×2): qty 1

## 2021-03-29 MED ORDER — CHLORHEXIDINE GLUCONATE CLOTH 2 % EX PADS
6.0000 | MEDICATED_PAD | Freq: Every day | CUTANEOUS | Status: DC
  Administered 2021-03-29 – 2021-04-06 (×9): 6 via TOPICAL

## 2021-03-29 MED ORDER — SODIUM CHLORIDE 0.9 % IV SOLN: INTRAVENOUS | Status: DC
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
## 2021-03-29 NOTE — Progress Notes (Signed)
RT set up and placed PT on home cpap
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
## 2021-03-29 NOTE — Progress Notes (Signed)
Inpatient Diabetes Program Recommendations  AACE/ADA: New Consensus Statement on Inpatient Glycemic Control   Target Ranges:  Prepandial:   less than 140 mg/dL      Peak postprandial:   less than 180 mg/dL (1-2 hours)      Critically ill patients:  140 - 180 mg/dL   Results for ARGIL, MAHL (MRN [id number]) as of 03/29/2021 12:03  Ref. Range 03/29/2021 08:31 03/29/2021 11:27  Glucose-Capillary Latest Ref Range: 70 - 99 mg/dL 226 (H) 207 (H)   Review of Glycemic Control  Diabetes history: DM2 (per merged chart Jesson, Foskey - [id number]) Outpatient Diabetes medications: Metformin 1000 mg QAM, Metformin 500 mg QPM, Amaryl 2 mg QAM, Jardiance 20 mg QAM Current orders for Inpatient glycemic control: Novolog 0-15 units Q4H  Inpatient Diabetes Program Recommendations:    Insulin: Noted Novolog correction started this morning. If glucose remains consistently over 180 mg/dl, please consider ordering Lantus 8 units Q24H.  Thanks, Barnie Alderman, RN, MSN, CDE Diabetes Coordinator Inpatient Diabetes Program [phone] (Team Pager from 8am to 5pm)

## 2021-03-30 ENCOUNTER — Inpatient Hospital Stay (HOSPITAL_COMMUNITY): Payer: Medicare Other

## 2021-03-30 LAB — GLUCOSE, CAPILLARY
Glucose-Capillary: 196 mg/dL — ABNORMAL HIGH (ref 70–99)
Glucose-Capillary: 235 mg/dL — ABNORMAL HIGH (ref 70–99)
Glucose-Capillary: 270 mg/dL — ABNORMAL HIGH (ref 70–99)
Glucose-Capillary: 225 mg/dL — ABNORMAL HIGH (ref 70–99)

## 2021-03-30 LAB — CBC
HCT: 30.5 % — ABNORMAL LOW (ref 39.0–52.0)
Hemoglobin: 10 g/dL — ABNORMAL LOW (ref 13.0–17.0)
MCH: 32.1 pg (ref 26.0–34.0)
MCHC: 32.8 g/dL (ref 30.0–36.0)
MCV: 97.8 fL (ref 80.0–100.0)
Platelets: 142 10*3/uL — ABNORMAL LOW (ref 150–400)
RBC: 3.12 MIL/uL — ABNORMAL LOW (ref 4.22–5.81)
RDW: 15 % (ref 11.5–15.5)
WBC: 9.1 10*3/uL (ref 4.0–10.5)
nRBC: 0 % (ref 0.0–0.2)

## 2021-03-30 LAB — BASIC METABOLIC PANEL
Anion gap: 5 (ref 5–15)
BUN: 26 mg/dL — ABNORMAL HIGH (ref 8–23)
CO2: 28 mmol/L (ref 22–32)
Calcium: 9.1 mg/dL (ref 8.9–10.3)
Chloride: 110 mmol/L (ref 98–111)
Creatinine, Ser: 1.12 mg/dL (ref 0.61–1.24)
GFR, Estimated: >60 mL/min (ref >60)
Glucose, Bld: 242 mg/dL — ABNORMAL HIGH (ref 70–99)
Potassium: 5 mmol/L (ref 3.5–5.1)
Sodium: 143 mmol/L (ref 135–145)

## 2021-03-30 LAB — HEMOGLOBIN A1C
Hgb A1c MFr Bld: 7.8 % — ABNORMAL HIGH (ref 4.8–5.6)
Mean Plasma Glucose: 177 mg/dL

## 2021-03-30 MED ORDER — METHOCARBAMOL 500 MG PO TABS
1000.0000 mg | ORAL_TABLET | Freq: Three times a day (TID) | ORAL | Status: DC
  Administered 2021-03-30 – 2021-04-06 (×23): 1000 mg via ORAL
  Filled 2021-03-30 (×23): qty 2

## 2021-03-30 MED ORDER — AMITRIPTYLINE HCL 10 MG PO TABS
10.0000 mg | ORAL_TABLET | Freq: Every day | ORAL | Status: DC
  Administered 2021-03-30 – 2021-04-05 (×7): 10 mg via ORAL
  Filled 2021-03-30 (×8): qty 1

## 2021-03-30 MED ORDER — SODIUM CHLORIDE 0.9 % IV SOLN: INTRAVENOUS | Status: DC

## 2021-03-30 MED ORDER — LIDOCAINE 5 % EX PTCH
1.0000 | MEDICATED_PATCH | CUTANEOUS | Status: DC
  Administered 2021-03-30 – 2021-04-06 (×7): 1 via TRANSDERMAL
  Filled 2021-03-30 (×8): qty 1

## 2021-03-30 MED ORDER — HYDROMORPHONE HCL 1 MG/ML IJ SOLN
0.5000 mg | INTRAMUSCULAR | Status: DC | PRN
  Administered 2021-03-31 – 2021-04-01 (×2): 0.5 mg via INTRAVENOUS
  Filled 2021-03-30 (×2): qty 1

## 2021-03-30 MED ORDER — ACETAMINOPHEN 500 MG PO TABS
1000.0000 mg | ORAL_TABLET | Freq: Four times a day (QID) | ORAL | Status: DC
  Administered 2021-03-30 – 2021-04-06 (×26): 1000 mg via ORAL
  Filled 2021-03-30 (×26): qty 2

## 2021-03-30 MED ORDER — FINASTERIDE 1 MG PO TABS: 1.0000 mg | ORAL_TABLET | Freq: Every day | ORAL | Status: DC

## 2021-03-30 MED ORDER — METOPROLOL TARTRATE 50 MG PO TABS
50.0000 mg | ORAL_TABLET | Freq: Two times a day (BID) | ORAL | Status: DC
  Administered 2021-03-30 – 2021-04-06 (×15): 50 mg via ORAL
  Filled 2021-03-30 (×16): qty 1

## 2021-03-30 MED ORDER — FUROSEMIDE 40 MG PO TABS
40.0000 mg | ORAL_TABLET | Freq: Every day | ORAL | Status: DC
  Administered 2021-03-30 – 2021-04-06 (×8): 40 mg via ORAL
  Filled 2021-03-30 (×8): qty 1

## 2021-03-30 MED ORDER — OXYCODONE HCL 5 MG/5ML PO SOLN
2.5000 mg | ORAL | Status: DC | PRN
  Administered 2021-03-30 – 2021-04-05 (×9): 5 mg via ORAL
  Filled 2021-03-30 (×9): qty 5

## 2021-03-30 MED ORDER — PREGABALIN 75 MG PO CAPS: 300.0000 mg | ORAL_CAPSULE | Freq: Two times a day (BID) | ORAL | Status: DC

## 2021-03-30 MED ORDER — LEVETIRACETAM IN NACL 500 MG/100ML IV SOLN
500.0000 mg | Freq: Two times a day (BID) | INTRAVENOUS | Status: AC
  Administered 2021-03-30 – 2021-04-04 (×12): 500 mg via INTRAVENOUS
  Filled 2021-03-30 (×12): qty 100

## 2021-03-30 NOTE — Progress Notes (Signed)
Inpatient Diabetes Program Recommendations  AACE/ADA: New Consensus Statement on Inpatient Glycemic Control   Target Ranges:  Prepandial:   less than 140 mg/dL      Peak postprandial:   less than 180 mg/dL (1-2 hours)      Critically ill patients:  140 - 180 mg/dL   Results for [name], [name] (MRN [id number]) as of 03/30/2021 11:27  Ref. Range 03/29/2021 08:31 03/29/2021 11:27 03/29/2021 15:12 03/29/2021 19:30 03/29/2021 23:13 03/30/2021 03:10 03/30/2021 07:29 03/30/2021 11:11  Glucose-Capillary Latest Ref Range: 70 - 99 mg/dL 226 (H) 207 (H) 185 (H) 196 (H) 198 (H) 196 (H) 235 (H) 270 (H)   Review of Glycemic Control  Diabetes history: DM2 (per merged chart [name], [name] - [id number]) Outpatient Diabetes medications: Metformin 1000 mg QAM, Metformin 500 mg QPM, Amaryl 2 mg QAM, Jardiance 20 mg QAM Current orders for Inpatient glycemic control: Novolog 0-15 units Q4H  Inpatient Diabetes Program Recommendations:    Insulin: Please consider ordering Lantus 8 units Q24H.  Thanks, Barnie Alderman, RN, MSN, CDE Diabetes Coordinator Inpatient Diabetes Program [phone] (Team Pager from 8am to 5pm)
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
## 2021-03-30 NOTE — Progress Notes (Signed)
Patient not placed on CPAP tonight due to AMS/trying to climb out of bed and pulling at IV lines.
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
## 2021-03-30 NOTE — TOC CAGE-AID Note (Signed)
Transition of Care [facility]) - CAGE-AID Screening   Patient Details  Name: [name] MRN: [id number] Date of Birth: [birth year]  [facility] CM/SW Contact:    Benard Halsted, [place] Phone Number: 03/30/2021, 8:43 AM   Clinical Narrative: Patient disoriented and unable to participate in screening at this time.    CAGE-AID Screening: Substance Abuse Screening unable to be completed due to: : Patient unable to participate

## 2021-03-31 ENCOUNTER — Inpatient Hospital Stay (HOSPITAL_COMMUNITY): Payer: Medicare Other

## 2021-03-31 LAB — GLUCOSE, CAPILLARY
Glucose-Capillary: 139 mg/dL — ABNORMAL HIGH (ref 70–99)
Glucose-Capillary: 141 mg/dL — ABNORMAL HIGH (ref 70–99)
Glucose-Capillary: 149 mg/dL — ABNORMAL HIGH (ref 70–99)
Glucose-Capillary: 161 mg/dL — ABNORMAL HIGH (ref 70–99)
Glucose-Capillary: 171 mg/dL — ABNORMAL HIGH (ref 70–99)
Glucose-Capillary: 175 mg/dL — ABNORMAL HIGH (ref 70–99)
Glucose-Capillary: 185 mg/dL — ABNORMAL HIGH (ref 70–99)

## 2021-03-31 LAB — CBC
HCT: 29.2 % — ABNORMAL LOW (ref 39.0–52.0)
Hemoglobin: 9.6 g/dL — ABNORMAL LOW (ref 13.0–17.0)
MCH: 32.1 pg (ref 26.0–34.0)
MCHC: 32.9 g/dL (ref 30.0–36.0)
MCV: 97.7 fL (ref 80.0–100.0)
Platelets: 142 10*3/uL — ABNORMAL LOW (ref 150–400)
RBC: 2.99 MIL/uL — ABNORMAL LOW (ref 4.22–5.81)
RDW: 15.1 % (ref 11.5–15.5)
WBC: 11.3 10*3/uL — ABNORMAL HIGH (ref 4.0–10.5)
nRBC: 0 % (ref 0.0–0.2)

## 2021-03-31 LAB — BASIC METABOLIC PANEL
Anion gap: 8 (ref 5–15)
BUN: 19 mg/dL (ref 8–23)
CO2: 27 mmol/L (ref 22–32)
Calcium: 8.9 mg/dL (ref 8.9–10.3)
Chloride: 107 mmol/L (ref 98–111)
Creatinine, Ser: 1.01 mg/dL (ref 0.61–1.24)
GFR, Estimated: >60 mL/min (ref >60)
Glucose, Bld: 147 mg/dL — ABNORMAL HIGH (ref 70–99)
Potassium: 3.5 mmol/L (ref 3.5–5.1)
Sodium: 142 mmol/L (ref 135–145)
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
## 2021-03-31 MED ORDER — LORAZEPAM 2 MG/ML IJ SOLN
0.5000 mg | Freq: Four times a day (QID) | INTRAMUSCULAR | Status: DC | PRN
  Administered 2021-03-31: 0.5 mg via INTRAVENOUS
  Filled 2021-03-31 (×2): qty 1

## 2021-03-31 MED ORDER — LORAZEPAM 2 MG/ML IJ SOLN
0.5000 mg | INTRAMUSCULAR | Status: DC | PRN
  Administered 2021-03-31 – 2021-04-03 (×7): 0.5 mg via INTRAVENOUS
  Filled 2021-03-31 (×6): qty 1

## 2021-03-31 MED ORDER — ENOXAPARIN SODIUM 30 MG/0.3ML IJ SOSY
30.0000 mg | PREFILLED_SYRINGE | Freq: Two times a day (BID) | INTRAMUSCULAR | Status: DC
  Administered 2021-03-31 – 2021-04-06 (×13): 30 mg via SUBCUTANEOUS
  Filled 2021-03-31 (×13): qty 0.3
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
## 2021-03-31 NOTE — Progress Notes (Signed)
..  Trauma Response Nurse Note-  Reason for Call - rounding on pt, spoke with charge nurse Varney Biles RN- states pt has been agitated- attempted to take restraints off one arm and pt started pulling at lines and wires. Unable to be redirected.    -  Initial Focused Assessment  -pt is confused, pulling at restraints, states "just put this over there on that table" with nothing in his hand. Wife is at bedside, requests something "Just to help him rest or relax"   Interventions:  -Spoke with Dr. Bobbye Morton - order received for Ativan 0.5mg  IV q6hours PRN/agitation  Plan of Care as of this note:  -updated charge nurse with order  Rolene Arbour, RN  Trauma Response Nurse  [phone]

## 2021-03-31 NOTE — Progress Notes (Signed)
PT Cancellation Note  Patient Details Name: LYNDALL WINDT MRN: [id number] DOB: Sep 16, 1941   Cancelled Treatment:    Reason Eval/Treat Not Completed: Other (comment).  Pt asleep.  RN reports a very restless AM and pt needing rest.  PT to check back on Monday 04/02/21 for TBI therapy.  Note to check his vestibular system, but he may not be appropriate for that yet given his level of restlessness and inability to focus attention and follow commands.   Thanks,  Verdene Lennert, PT, DPT  Acute Rehabilitation Ortho Tech Supervisor [phone] pager #[phone] office       Wells Guiles B Kerrie Latour 03/31/2021, 11:19 AM

## 2021-03-31 NOTE — Progress Notes (Signed)
Pateints wife took pts CPAP home due to him not wearing it.

## 2021-03-31 NOTE — Progress Notes (Signed)
Inpatient Rehab Admissions:  Inpatient Rehab Consult received.  I met with patient and wife at the bedside for rehabilitation assessment and to discuss goals and expectations of an inpatient rehab admission.  Pt was asleep so spoke with wife.  She acknowledged understanding of goals and expectations. She is interested in pt pursuing CIR. Will continue to follow.     Signed: Gayland Curry, [place], [place] Admissions Coordinator [phone]
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
## 2021-03-31 NOTE — Plan of Care (Signed)

## 2021-04-01 LAB — GLUCOSE, CAPILLARY
Glucose-Capillary: 159 mg/dL — ABNORMAL HIGH (ref 70–99)
Glucose-Capillary: 153 mg/dL — ABNORMAL HIGH (ref 70–99)
Glucose-Capillary: 172 mg/dL — ABNORMAL HIGH (ref 70–99)
Glucose-Capillary: 151 mg/dL — ABNORMAL HIGH (ref 70–99)
Glucose-Capillary: 184 mg/dL — ABNORMAL HIGH (ref 70–99)

## 2021-04-01 LAB — CBC
HCT: 27.2 % — ABNORMAL LOW (ref 39.0–52.0)
Hemoglobin: 9.1 g/dL — ABNORMAL LOW (ref 13.0–17.0)
MCH: 32.2 pg (ref 26.0–34.0)
MCHC: 33.5 g/dL (ref 30.0–36.0)
MCV: 96.1 fL (ref 80.0–100.0)
Platelets: 145 10*3/uL — ABNORMAL LOW (ref 150–400)
RBC: 2.83 MIL/uL — ABNORMAL LOW (ref 4.22–5.81)
RDW: 14.7 % (ref 11.5–15.5)
WBC: 7 10*3/uL (ref 4.0–10.5)
nRBC: 0 % (ref 0.0–0.2)

## 2021-04-01 LAB — BASIC METABOLIC PANEL
Anion gap: 9 (ref 5–15)
BUN: 22 mg/dL (ref 8–23)
CO2: 25 mmol/L (ref 22–32)
Calcium: 8.5 mg/dL — ABNORMAL LOW (ref 8.9–10.3)
Chloride: 109 mmol/L (ref 98–111)
Creatinine, Ser: 1.06 mg/dL (ref 0.61–1.24)
GFR, Estimated: >60 mL/min (ref >60)
Glucose, Bld: 163 mg/dL — ABNORMAL HIGH (ref 70–99)
Potassium: 3.3 mmol/L — ABNORMAL LOW (ref 3.5–5.1)
Sodium: 143 mmol/L (ref 135–145)

## 2021-04-01 MED ORDER — POTASSIUM CHLORIDE CRYS ER 20 MEQ PO TBCR
40.0000 meq | EXTENDED_RELEASE_TABLET | ORAL | Status: AC
  Administered 2021-04-01 (×2): 40 meq via ORAL
  Filled 2021-04-01 (×2): qty 2

## 2021-04-01 MED ORDER — QUETIAPINE FUMARATE 50 MG PO TABS
25.0000 mg | ORAL_TABLET | Freq: Two times a day (BID) | ORAL | Status: DC
  Administered 2021-04-01 (×2): 25 mg via ORAL
  Filled 2021-04-01 (×2): qty 1
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
## 2021-04-02 LAB — CBC
HCT: 29.6 % — ABNORMAL LOW (ref 39.0–52.0)
Hemoglobin: 9.9 g/dL — ABNORMAL LOW (ref 13.0–17.0)
MCH: 32.2 pg (ref 26.0–34.0)
MCHC: 33.4 g/dL (ref 30.0–36.0)
MCV: 96.4 fL (ref 80.0–100.0)
Platelets: 173 10*3/uL (ref 150–400)
RBC: 3.07 MIL/uL — ABNORMAL LOW (ref 4.22–5.81)
RDW: 14.9 % (ref 11.5–15.5)
WBC: 7.2 10*3/uL (ref 4.0–10.5)
nRBC: 0 % (ref 0.0–0.2)

## 2021-04-02 LAB — BASIC METABOLIC PANEL
Anion gap: 14 (ref 5–15)
BUN: 25 mg/dL — ABNORMAL HIGH (ref 8–23)
CO2: 21 mmol/L — ABNORMAL LOW (ref 22–32)
Calcium: 8.4 mg/dL — ABNORMAL LOW (ref 8.9–10.3)
Chloride: 110 mmol/L (ref 98–111)
Creatinine, Ser: 1.24 mg/dL (ref 0.61–1.24)
GFR, Estimated: 58 mL/min — ABNORMAL LOW (ref >60)
Glucose, Bld: 154 mg/dL — ABNORMAL HIGH (ref 70–99)
Potassium: 3 mmol/L — ABNORMAL LOW (ref 3.5–5.1)
Sodium: 145 mmol/L (ref 135–145)

## 2021-04-02 LAB — GLUCOSE, CAPILLARY
Glucose-Capillary: 165 mg/dL — ABNORMAL HIGH (ref 70–99)
Glucose-Capillary: 132 mg/dL — ABNORMAL HIGH (ref 70–99)
Glucose-Capillary: 146 mg/dL — ABNORMAL HIGH (ref 70–99)
Glucose-Capillary: 124 mg/dL — ABNORMAL HIGH (ref 70–99)
Glucose-Capillary: 99 mg/dL (ref 70–99)

## 2021-04-02 MED ORDER — QUETIAPINE FUMARATE 50 MG PO TABS
50.0000 mg | ORAL_TABLET | Freq: Two times a day (BID) | ORAL | Status: DC
  Administered 2021-04-02 – 2021-04-06 (×9): 50 mg via ORAL
  Filled 2021-04-02 (×9): qty 1
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
## 2021-04-02 NOTE — Care Management Important Message (Signed)
Important Message  Patient Details  Name: [name] MRN: [id number] Date of Birth: [birth year]   Medicare Important Message Given:  Yes     Arzu Mcgaughey Montine Circle 04/02/2021, 4:37 PM
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
## 2021-04-02 NOTE — Progress Notes (Signed)
Inpatient Rehab Admissions Coordinator:  Saw pt and wife at bedside. Pt asleep so spoke with wife. Informed her that we are continuing to monitor medical workup and progress with therapies to help determine appropriateness for CIR.   Gayland Curry, [place], [place] Admissions Coordinator [phone]

## 2021-04-03 LAB — GLUCOSE, CAPILLARY
Glucose-Capillary: 155 mg/dL — ABNORMAL HIGH (ref 70–99)
Glucose-Capillary: 145 mg/dL — ABNORMAL HIGH (ref 70–99)
Glucose-Capillary: 124 mg/dL — ABNORMAL HIGH (ref 70–99)
Glucose-Capillary: 243 mg/dL — ABNORMAL HIGH (ref 70–99)
Glucose-Capillary: 221 mg/dL — ABNORMAL HIGH (ref 70–99)
Glucose-Capillary: 228 mg/dL — ABNORMAL HIGH (ref 70–99)
Glucose-Capillary: 287 mg/dL — ABNORMAL HIGH (ref 70–99)

## 2021-04-03 MED ORDER — POTASSIUM CHLORIDE CRYS ER 20 MEQ PO TBCR
40.0000 meq | EXTENDED_RELEASE_TABLET | ORAL | Status: AC
  Administered 2021-04-03 (×2): 40 meq via ORAL
  Filled 2021-04-03 (×2): qty 2
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
## 2021-04-03 NOTE — Progress Notes (Signed)
Inpatient Rehab Admissions Coordinator:   Met with patient and his spouse at the bedside to discuss CIR.  I let her know that I began insurance authorization this morning, and hope to have an answer by Thursday morning at the latest.  I also addressed her questions regarding transition to CIR, what to expect as far as therapies, goals, and estimated length of stay, and provided encouragement that now that he's medically stable, he is ready for that transition.  I will f/u with her as soon as I hear from insurance.   Shann Medal, PT, DPT Admissions Coordinator [phone] 04/03/21  1:47 PM
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
## 2021-04-03 NOTE — TOC Progression Note (Signed)
Transition of Care [facility]) - Progression Note    Patient Details  Name: [name] MRN: [id number] Date of Birth: 09-01-1941  Transition of Care [facility]) CM/SW Contact  Oren Section Cleta Alberts, RN Phone Number: 04/03/2021, 4:37 PM  Clinical Narrative:  Pt medically ready for CIR; insurance authorization is pending.  Will continue to follow progress.       Expected Discharge Plan: IP Rehab Facility Barriers to Discharge: Continued Medical Work up  Expected Discharge Plan and Services Expected Discharge Plan: [place]   Discharge Planning Services: CM Consult   Living arrangements for the past 2 months: Single Family Home                                       Social Determinants of Health (SDOH) Interventions    Readmission Risk Interventions No flowsheet data found.  Reinaldo Raddle, RN, BSN  Trauma/Neuro ICU Case Manager [phone]

## 2021-04-03 NOTE — Progress Notes (Signed)
Inpatient Rehab Admissions Coordinator:   I have no beds available for this patient to admit to CIR today.  Will continue to follow for timing of potential admission pending bed availability. Insurance Josem Kaufmann is also pending.   Shann Medal, PT, DPT Admissions Coordinator [phone] 04/03/21  9:54 AM

## 2021-04-04 LAB — BASIC METABOLIC PANEL
Anion gap: 9 (ref 5–15)
BUN: 15 mg/dL (ref 8–23)
CO2: 25 mmol/L (ref 22–32)
Calcium: 8.5 mg/dL — ABNORMAL LOW (ref 8.9–10.3)
Chloride: 109 mmol/L (ref 98–111)
Creatinine, Ser: 0.99 mg/dL (ref 0.61–1.24)
GFR, Estimated: >60 mL/min (ref >60)
Glucose, Bld: 146 mg/dL — ABNORMAL HIGH (ref 70–99)
Potassium: 2.9 mmol/L — ABNORMAL LOW (ref 3.5–5.1)
Sodium: 143 mmol/L (ref 135–145)

## 2021-04-04 LAB — GLUCOSE, CAPILLARY
Glucose-Capillary: 145 mg/dL — ABNORMAL HIGH (ref 70–99)
Glucose-Capillary: 142 mg/dL — ABNORMAL HIGH (ref 70–99)
Glucose-Capillary: 231 mg/dL — ABNORMAL HIGH (ref 70–99)
Glucose-Capillary: 296 mg/dL — ABNORMAL HIGH (ref 70–99)
Glucose-Capillary: 209 mg/dL — ABNORMAL HIGH (ref 70–99)
Glucose-Capillary: 172 mg/dL — ABNORMAL HIGH (ref 70–99)

## 2021-04-04 MED ORDER — POTASSIUM CHLORIDE 20 MEQ PO PACK
40.0000 meq | PACK | Freq: Two times a day (BID) | ORAL | Status: AC
  Administered 2021-04-04 (×2): 40 meq via ORAL
  Filled 2021-04-04 (×2): qty 2
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
## 2021-04-04 NOTE — Progress Notes (Signed)
Inpatient Rehab Admissions Coordinator:   Awaiting determination from [facility] Medicare on CIR prior auth request.  Will continue to follow.   Shann Medal, PT, DPT Admissions Coordinator [phone] 04/04/21  2:05 PM
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
## 2021-04-04 NOTE — Progress Notes (Signed)
Inpatient Diabetes Program Recommendations  AACE/ADA: New Consensus Statement on Inpatient Glycemic Control   Target Ranges:  Prepandial:   less than 140 mg/dL      Peak postprandial:   less than 180 mg/dL (1-2 hours)      Critically ill patients:  140 - 180 mg/dL   Results for [name], [name] (MRN [id number]) as of 04/04/2021 11:41  Ref. Range 04/03/2021 07:25 04/03/2021 11:22 04/03/2021 15:19 04/03/2021 19:28 04/03/2021 23:22 04/04/2021 03:31 04/04/2021 07:38 04/04/2021 11:35  Glucose-Capillary Latest Ref Range: 70 - 99 mg/dL 124 (H)  Novolog 2 units 243 (H)  Novolog 5 units 221 (H)  Novolog 5 units 228 (H)  Novolog 5 units 287 (H)  Novolog 8 units 145 (H)  Novolog 2 units 142 (H)  Novolog 2 units 231 (H)   Review of Glycemic Control  Diabetes history:DM2 (per merged [name], [name] - [id number]) Outpatient Diabetes medications:Metformin 1000 mg QAM, Metformin 500 mg QPM, Amaryl 2 mg QAM, Jardiance 20 mg QAM Current orders for Inpatient glycemic control:Novolog 0-15 units Q4H  Inpatient Diabetes Program Recommendations:  Insulin: Please consider ordering Lantus 8 units Q24H.  Thanks, Kristopher Alderman, RN, MSN, CDE Diabetes Coordinator Inpatient Diabetes Program [phone] (Team Pager from 8am to 5pm)

## 2021-04-05 LAB — BASIC METABOLIC PANEL
Anion gap: 8 (ref 5–15)
BUN: 14 mg/dL (ref 8–23)
CO2: 25 mmol/L (ref 22–32)
Calcium: 8.4 mg/dL — ABNORMAL LOW (ref 8.9–10.3)
Chloride: 105 mmol/L (ref 98–111)
Creatinine, Ser: 0.98 mg/dL (ref 0.61–1.24)
GFR, Estimated: >60 mL/min (ref >60)
Glucose, Bld: 250 mg/dL — ABNORMAL HIGH (ref 70–99)
Potassium: 3.4 mmol/L — ABNORMAL LOW (ref 3.5–5.1)
Sodium: 138 mmol/L (ref 135–145)

## 2021-04-05 LAB — GLUCOSE, CAPILLARY
Glucose-Capillary: 212 mg/dL — ABNORMAL HIGH (ref 70–99)
Glucose-Capillary: 169 mg/dL — ABNORMAL HIGH (ref 70–99)
Glucose-Capillary: 210 mg/dL — ABNORMAL HIGH (ref 70–99)
Glucose-Capillary: 233 mg/dL — ABNORMAL HIGH (ref 70–99)
Glucose-Capillary: 222 mg/dL — ABNORMAL HIGH (ref 70–99)
Glucose-Capillary: 246 mg/dL — ABNORMAL HIGH (ref 70–99)

## 2021-04-05 MED ORDER — INSULIN ASPART 100 UNIT/ML IJ SOLN
0.0000 [IU] | INTRAMUSCULAR | Status: DC
  Administered 2021-04-05 (×2): 7 [IU] via SUBCUTANEOUS
  Administered 2021-04-06: 11 [IU] via SUBCUTANEOUS
  Administered 2021-04-06: 4 [IU] via SUBCUTANEOUS
  Administered 2021-04-06: 7 [IU] via SUBCUTANEOUS
  Administered 2021-04-06: 11 [IU] via SUBCUTANEOUS
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
## 2021-04-05 NOTE — Progress Notes (Signed)
Inpatient Rehab Admissions Coordinator:   Received a call from insurance that indicated they would like a peer to peer for CIR review.  Dr. Grandville Silos completed and pt is approved for CIR. I do not have a bed for this patient to admit today but will follow for possible admission tomorrow pending availability.   Shann Medal, PT, DPT Admissions Coordinator [phone] 04/05/21  12:10 PM
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
## 2021-04-06 ENCOUNTER — Encounter (HOSPITAL_COMMUNITY): Payer: Self-pay | Admitting: Physical Medicine & Rehabilitation

## 2021-04-06 ENCOUNTER — Inpatient Hospital Stay (HOSPITAL_COMMUNITY)
Admission: RE | Admit: 2021-04-06 | Discharge: 2021-04-28 | DRG: 946 | Disposition: A | Discharge status: Home Health | Payer: Medicare Other | Source: Intra-hospital | Attending: Physical Medicine & Rehabilitation | Admitting: Physical Medicine & Rehabilitation

## 2021-04-06 ENCOUNTER — Other Ambulatory Visit: Payer: Self-pay

## 2021-04-06 DIAGNOSIS — R519 Headache, unspecified: Secondary | ICD-10-CM | POA: Diagnosis not present

## 2021-04-06 DIAGNOSIS — D649 Anemia, unspecified: Secondary | ICD-10-CM | POA: Diagnosis not present

## 2021-04-06 DIAGNOSIS — S066X9D Traumatic subarachnoid hemorrhage with loss of consciousness of unspecified duration, subsequent encounter: Secondary | ICD-10-CM | POA: Diagnosis not present

## 2021-04-06 DIAGNOSIS — S0232XA Fracture of orbital floor, left side, initial encounter for closed fracture: Secondary | ICD-10-CM | POA: Diagnosis not present

## 2021-04-06 DIAGNOSIS — S066X0A Traumatic subarachnoid hemorrhage without loss of consciousness, initial encounter: Secondary | ICD-10-CM | POA: Diagnosis not present

## 2021-04-06 DIAGNOSIS — S02842D Fracture of lateral orbital wall, left side, subsequent encounter for fracture with routine healing: Secondary | ICD-10-CM

## 2021-04-06 DIAGNOSIS — I959 Hypotension, unspecified: Secondary | ICD-10-CM | POA: Diagnosis not present

## 2021-04-06 DIAGNOSIS — K5900 Constipation, unspecified: Secondary | ICD-10-CM | POA: Diagnosis present

## 2021-04-06 DIAGNOSIS — S065X0A Traumatic subdural hemorrhage without loss of consciousness, initial encounter: Secondary | ICD-10-CM

## 2021-04-06 DIAGNOSIS — S06890A Other specified intracranial injury without loss of consciousness, initial encounter: Secondary | ICD-10-CM | POA: Diagnosis not present

## 2021-04-06 DIAGNOSIS — S02842S Fracture of lateral orbital wall, left side, sequela: Secondary | ICD-10-CM

## 2021-04-06 DIAGNOSIS — S0240DD Maxillary fracture, left side, subsequent encounter for fracture with routine healing: Secondary | ICD-10-CM | POA: Diagnosis not present

## 2021-04-06 DIAGNOSIS — S0219XD Other fracture of base of skull, subsequent encounter for fracture with routine healing: Secondary | ICD-10-CM | POA: Diagnosis not present

## 2021-04-06 DIAGNOSIS — Z888 Allergy status to other drugs, medicaments and biological substances status: Secondary | ICD-10-CM

## 2021-04-06 DIAGNOSIS — Z79899 Other long term (current) drug therapy: Secondary | ICD-10-CM | POA: Diagnosis not present

## 2021-04-06 DIAGNOSIS — I10 Essential (primary) hypertension: Secondary | ICD-10-CM | POA: Diagnosis present

## 2021-04-06 DIAGNOSIS — E119 Type 2 diabetes mellitus without complications: Secondary | ICD-10-CM | POA: Diagnosis present

## 2021-04-06 DIAGNOSIS — S065X0S Traumatic subdural hemorrhage without loss of consciousness, sequela: Secondary | ICD-10-CM | POA: Diagnosis not present

## 2021-04-06 DIAGNOSIS — S065X9A Traumatic subdural hemorrhage with loss of consciousness of unspecified duration, initial encounter: Secondary | ICD-10-CM | POA: Diagnosis not present

## 2021-04-06 DIAGNOSIS — K5901 Slow transit constipation: Secondary | ICD-10-CM | POA: Diagnosis not present

## 2021-04-06 DIAGNOSIS — I4891 Unspecified atrial fibrillation: Secondary | ICD-10-CM | POA: Diagnosis not present

## 2021-04-06 DIAGNOSIS — R918 Other nonspecific abnormal finding of lung field: Secondary | ICD-10-CM | POA: Diagnosis present

## 2021-04-06 DIAGNOSIS — I482 Chronic atrial fibrillation, unspecified: Secondary | ICD-10-CM

## 2021-04-06 DIAGNOSIS — S0990XA Unspecified injury of head, initial encounter: Secondary | ICD-10-CM | POA: Diagnosis not present

## 2021-04-06 DIAGNOSIS — Z7984 Long term (current) use of oral hypoglycemic drugs: Secondary | ICD-10-CM

## 2021-04-06 DIAGNOSIS — R531 Weakness: Secondary | ICD-10-CM | POA: Diagnosis not present

## 2021-04-06 DIAGNOSIS — S065X9D Traumatic subdural hemorrhage with loss of consciousness of unspecified duration, subsequent encounter: Principal | ICD-10-CM

## 2021-04-06 DIAGNOSIS — Z7901 Long term (current) use of anticoagulants: Secondary | ICD-10-CM

## 2021-04-06 DIAGNOSIS — S0003XA Contusion of scalp, initial encounter: Secondary | ICD-10-CM | POA: Diagnosis not present

## 2021-04-06 DIAGNOSIS — E876 Hypokalemia: Secondary | ICD-10-CM | POA: Diagnosis not present

## 2021-04-06 DIAGNOSIS — E1142 Type 2 diabetes mellitus with diabetic polyneuropathy: Secondary | ICD-10-CM | POA: Diagnosis present

## 2021-04-06 DIAGNOSIS — S066X9S Traumatic subarachnoid hemorrhage with loss of consciousness of unspecified duration, sequela: Secondary | ICD-10-CM | POA: Diagnosis not present

## 2021-04-06 DIAGNOSIS — G4700 Insomnia, unspecified: Secondary | ICD-10-CM | POA: Diagnosis not present

## 2021-04-06 DIAGNOSIS — R296 Repeated falls: Secondary | ICD-10-CM | POA: Diagnosis not present

## 2021-04-06 DIAGNOSIS — S065X3S Traumatic subdural hemorrhage with loss of consciousness of 1 hour to 5 hours 59 minutes, sequela: Secondary | ICD-10-CM

## 2021-04-06 DIAGNOSIS — G629 Polyneuropathy, unspecified: Secondary | ICD-10-CM | POA: Diagnosis not present

## 2021-04-06 DIAGNOSIS — N189 Chronic kidney disease, unspecified: Secondary | ICD-10-CM | POA: Diagnosis not present

## 2021-04-06 DIAGNOSIS — M792 Neuralgia and neuritis, unspecified: Secondary | ICD-10-CM | POA: Diagnosis not present

## 2021-04-06 DIAGNOSIS — S065XAA Traumatic subdural hemorrhage with loss of consciousness status unknown, initial encounter: Secondary | ICD-10-CM | POA: Diagnosis present

## 2021-04-06 LAB — GLUCOSE, CAPILLARY
Glucose-Capillary: 252 mg/dL — ABNORMAL HIGH (ref 70–99)
Glucose-Capillary: 179 mg/dL — ABNORMAL HIGH (ref 70–99)
Glucose-Capillary: 254 mg/dL — ABNORMAL HIGH (ref 70–99)
Glucose-Capillary: 175 mg/dL — ABNORMAL HIGH (ref 70–99)
Glucose-Capillary: 277 mg/dL — ABNORMAL HIGH (ref 70–99)

## 2021-04-06 MED ORDER — ENOXAPARIN SODIUM 30 MG/0.3ML IJ SOSY
30.0000 mg | PREFILLED_SYRINGE | Freq: Two times a day (BID) | INTRAMUSCULAR | Status: DC
  Administered 2021-04-06 – 2021-04-26 (×40): 30 mg via SUBCUTANEOUS
  Filled 2021-04-06 (×39): qty 0.3

## 2021-04-06 MED ORDER — QUETIAPINE FUMARATE 50 MG PO TABS
50.0000 mg | ORAL_TABLET | Freq: Two times a day (BID) | ORAL | Status: DC
  Administered 2021-04-06 – 2021-04-11 (×10): 50 mg via ORAL
  Filled 2021-04-06 (×2): qty 1
  Filled 2021-04-06: qty 2
  Filled 2021-04-06 (×7): qty 1

## 2021-04-06 MED ORDER — LIDOCAINE 5 % EX PTCH
1.0000 | MEDICATED_PATCH | CUTANEOUS | Status: DC
  Administered 2021-04-07: 1 via TRANSDERMAL
  Filled 2021-04-06: qty 1

## 2021-04-06 MED ORDER — ENOXAPARIN SODIUM 30 MG/0.3ML IJ SOSY: 30.0000 mg | PREFILLED_SYRINGE | Freq: Two times a day (BID) | INTRAMUSCULAR | Status: DC

## 2021-04-06 MED ORDER — METOPROLOL TARTRATE 50 MG PO TABS
50.0000 mg | ORAL_TABLET | Freq: Two times a day (BID) | ORAL | Status: DC
  Administered 2021-04-06 – 2021-04-16 (×20): 50 mg via ORAL
  Filled 2021-04-06 (×21): qty 1

## 2021-04-06 MED ORDER — INSULIN GLARGINE 100 UNIT/ML ~~LOC~~ SOLN
10.0000 [IU] | Freq: Every day | SUBCUTANEOUS | Status: DC
  Filled 2021-04-06: qty 0.1

## 2021-04-06 MED ORDER — FUROSEMIDE 40 MG PO TABS
40.0000 mg | ORAL_TABLET | Freq: Every day | ORAL | Status: DC
  Administered 2021-04-07 – 2021-04-28 (×22): 40 mg via ORAL
  Filled 2021-04-06 (×22): qty 1

## 2021-04-06 MED ORDER — BOOST / RESOURCE BREEZE PO LIQD CUSTOM
1.0000 | Freq: Three times a day (TID) | ORAL | Status: DC
  Administered 2021-04-06: 1 via ORAL

## 2021-04-06 MED ORDER — AMITRIPTYLINE HCL 10 MG PO TABS
10.0000 mg | ORAL_TABLET | Freq: Every day | ORAL | Status: DC
  Administered 2021-04-06 – 2021-04-08 (×3): 10 mg via ORAL
  Filled 2021-04-06 (×4): qty 1

## 2021-04-06 MED ORDER — ONDANSETRON 4 MG PO TBDP
4.0000 mg | ORAL_TABLET | Freq: Four times a day (QID) | ORAL | Status: DC | PRN
  Administered 2021-04-14: 4 mg via ORAL
  Filled 2021-04-06: qty 1

## 2021-04-06 MED ORDER — BOOST / RESOURCE BREEZE PO LIQD CUSTOM
1.0000 | Freq: Three times a day (TID) | ORAL | Status: DC
  Administered 2021-04-06 – 2021-04-09 (×8): 1 via ORAL

## 2021-04-06 MED ORDER — PREGABALIN 50 MG PO CAPS
100.0000 mg | ORAL_CAPSULE | Freq: Two times a day (BID) | ORAL | Status: DC
  Administered 2021-04-06 – 2021-04-28 (×44): 100 mg via ORAL
  Filled 2021-04-06 (×44): qty 2

## 2021-04-06 MED ORDER — OXYCODONE HCL 5 MG/5ML PO SOLN
2.5000 mg | ORAL | Status: DC | PRN
  Administered 2021-04-07 – 2021-04-27 (×31): 5 mg via ORAL
  Filled 2021-04-06 (×32): qty 5

## 2021-04-06 MED ORDER — METHOCARBAMOL 500 MG PO TABS
1000.0000 mg | ORAL_TABLET | Freq: Three times a day (TID) | ORAL | Status: DC
  Administered 2021-04-06 – 2021-04-22 (×47): 1000 mg via ORAL
  Filled 2021-04-06 (×47): qty 2

## 2021-04-06 MED ORDER — INSULIN ASPART 100 UNIT/ML IJ SOLN
0.0000 [IU] | INTRAMUSCULAR | Status: DC
  Administered 2021-04-06: 11 [IU] via SUBCUTANEOUS
  Administered 2021-04-06 – 2021-04-07 (×2): 4 [IU] via SUBCUTANEOUS
  Administered 2021-04-07 (×3): 11 [IU] via SUBCUTANEOUS
  Administered 2021-04-07: 4 [IU] via SUBCUTANEOUS
  Administered 2021-04-07: 11 [IU] via SUBCUTANEOUS
  Administered 2021-04-07: 7 [IU] via SUBCUTANEOUS
  Administered 2021-04-08: 4 [IU] via SUBCUTANEOUS
  Administered 2021-04-08: 11 [IU] via SUBCUTANEOUS

## 2021-04-06 MED ORDER — ACETAMINOPHEN 325 MG PO TABS
325.0000 mg | ORAL_TABLET | ORAL | Status: DC | PRN
  Administered 2021-04-08 – 2021-04-27 (×12): 650 mg via ORAL
  Filled 2021-04-06 (×14): qty 2

## 2021-04-06 MED ORDER — AMIODARONE HCL 200 MG PO TABS
200.0000 mg | ORAL_TABLET | Freq: Every day | ORAL | Status: DC
  Administered 2021-04-07 – 2021-04-28 (×22): 200 mg via ORAL
  Filled 2021-04-06 (×22): qty 1

## 2021-04-06 MED ORDER — ONDANSETRON HCL 4 MG/2ML IJ SOLN: 4.0000 mg | Freq: Four times a day (QID) | INTRAMUSCULAR | Status: DC | PRN
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
## 2021-04-06 NOTE — Progress Notes (Signed)
Inpatient Rehab Admissions Coordinator:   I have auth for CIR and a bed available for pt to admit today.  Brooke Meuthe, PA-C, in agreement.  I will let pt/family and TOC team know.   Shann Medal, PT, DPT Admissions Coordinator [phone] 04/06/21  10:16 AM
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
## 2021-04-06 NOTE — Progress Notes (Signed)
Inpatient Rehabilitation Medication Review by a Pharmacist  A complete drug regimen review was completed for this patient to identify any potential clinically significant medication issues.  Clinically significant medication issues were identified:  no  Check AMION for pharmacist assigned to patient if future medication questions/issues arise during this admission.  Pharmacist comments:   Time spent performing this drug regimen review (minutes):  15   [name] 04/06/2021 5:47 PM

## 2021-04-07 DIAGNOSIS — G629 Polyneuropathy, unspecified: Secondary | ICD-10-CM

## 2021-04-07 DIAGNOSIS — K5901 Slow transit constipation: Secondary | ICD-10-CM

## 2021-04-07 LAB — GLUCOSE, CAPILLARY
Glucose-Capillary: 192 mg/dL — ABNORMAL HIGH (ref 70–99)
Glucose-Capillary: 197 mg/dL — ABNORMAL HIGH (ref 70–99)
Glucose-Capillary: 219 mg/dL — ABNORMAL HIGH (ref 70–99)
Glucose-Capillary: 252 mg/dL — ABNORMAL HIGH (ref 70–99)
Glucose-Capillary: 255 mg/dL — ABNORMAL HIGH (ref 70–99)
Glucose-Capillary: 270 mg/dL — ABNORMAL HIGH (ref 70–99)
Glucose-Capillary: 288 mg/dL — ABNORMAL HIGH (ref 70–99)

## 2021-04-07 MED ORDER — SORBITOL 70 % SOLN
30.0000 mL | Freq: Once | Status: AC
  Administered 2021-04-07: 30 mL via ORAL
  Filled 2021-04-07: qty 30

## 2021-04-07 MED ORDER — LIDOCAINE 5 % EX PTCH
3.0000 | MEDICATED_PATCH | CUTANEOUS | Status: DC
  Administered 2021-04-08 – 2021-04-28 (×20): 3 via TRANSDERMAL
  Filled 2021-04-07 (×22): qty 3

## 2021-04-07 MED ORDER — GLIMEPIRIDE 2 MG PO TABS
2.0000 mg | ORAL_TABLET | Freq: Every day | ORAL | Status: DC
  Administered 2021-04-08 – 2021-04-10 (×3): 2 mg via ORAL
  Filled 2021-04-07 (×3): qty 1
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
## 2021-04-08 LAB — GLUCOSE, CAPILLARY
Glucose-Capillary: 178 mg/dL — ABNORMAL HIGH (ref 70–99)
Glucose-Capillary: 210 mg/dL — ABNORMAL HIGH (ref 70–99)
Glucose-Capillary: 267 mg/dL — ABNORMAL HIGH (ref 70–99)
Glucose-Capillary: 216 mg/dL — ABNORMAL HIGH (ref 70–99)
Glucose-Capillary: 189 mg/dL — ABNORMAL HIGH (ref 70–99)

## 2021-04-08 MED ORDER — INSULIN ASPART 100 UNIT/ML IJ SOLN
0.0000 [IU] | Freq: Every day | INTRAMUSCULAR | Status: DC
  Administered 2021-04-09 – 2021-04-10 (×2): 3 [IU] via SUBCUTANEOUS
  Administered 2021-04-11: 4 [IU] via SUBCUTANEOUS
  Administered 2021-04-12: 3 [IU] via SUBCUTANEOUS
  Administered 2021-04-13 – 2021-04-22 (×3): 2 [IU] via SUBCUTANEOUS
  Administered 2021-04-23 – 2021-04-26 (×2): 3 [IU] via SUBCUTANEOUS
  Administered 2021-04-27: 2 [IU] via SUBCUTANEOUS

## 2021-04-08 MED ORDER — INSULIN ASPART 100 UNIT/ML IJ SOLN
0.0000 [IU] | Freq: Three times a day (TID) | INTRAMUSCULAR | Status: DC
  Administered 2021-04-08 – 2021-04-09 (×2): 5 [IU] via SUBCUTANEOUS
  Administered 2021-04-09: 3 [IU] via SUBCUTANEOUS
  Administered 2021-04-10 (×2): 5 [IU] via SUBCUTANEOUS
  Administered 2021-04-10: 3 [IU] via SUBCUTANEOUS
  Administered 2021-04-11: 8 [IU] via SUBCUTANEOUS
  Administered 2021-04-11: 5 [IU] via SUBCUTANEOUS
  Administered 2021-04-11: 3 [IU] via SUBCUTANEOUS
  Administered 2021-04-12 (×2): 5 [IU] via SUBCUTANEOUS
  Administered 2021-04-12 – 2021-04-13 (×2): 3 [IU] via SUBCUTANEOUS
  Administered 2021-04-13: 8 [IU] via SUBCUTANEOUS
  Administered 2021-04-13: 3 [IU] via SUBCUTANEOUS
  Administered 2021-04-14: 5 [IU] via SUBCUTANEOUS
  Administered 2021-04-14: 3 [IU] via SUBCUTANEOUS
  Administered 2021-04-14: 5 [IU] via SUBCUTANEOUS
  Administered 2021-04-15 (×2): 3 [IU] via SUBCUTANEOUS
  Administered 2021-04-15: 5 [IU] via SUBCUTANEOUS
  Administered 2021-04-16: 3 [IU] via SUBCUTANEOUS
  Administered 2021-04-16: 2 [IU] via SUBCUTANEOUS
  Administered 2021-04-16 – 2021-04-17 (×2): 3 [IU] via SUBCUTANEOUS
  Administered 2021-04-17: 2 [IU] via SUBCUTANEOUS
  Administered 2021-04-18: 3 [IU] via SUBCUTANEOUS
  Administered 2021-04-18 – 2021-04-19 (×2): 2 [IU] via SUBCUTANEOUS
  Administered 2021-04-19: 3 [IU] via SUBCUTANEOUS
  Administered 2021-04-19: 5 [IU] via SUBCUTANEOUS
  Administered 2021-04-20 (×3): 2 [IU] via SUBCUTANEOUS
  Administered 2021-04-21: 3 [IU] via SUBCUTANEOUS
  Administered 2021-04-21 – 2021-04-22 (×3): 2 [IU] via SUBCUTANEOUS
  Administered 2021-04-22: 8 [IU] via SUBCUTANEOUS
  Administered 2021-04-23: 3 [IU] via SUBCUTANEOUS
  Administered 2021-04-23: 8 [IU] via SUBCUTANEOUS
  Administered 2021-04-23 – 2021-04-24 (×2): 2 [IU] via SUBCUTANEOUS
  Administered 2021-04-24 – 2021-04-25 (×4): 3 [IU] via SUBCUTANEOUS
  Administered 2021-04-26: 8 [IU] via SUBCUTANEOUS
  Administered 2021-04-26 – 2021-04-27 (×2): 3 [IU] via SUBCUTANEOUS
  Administered 2021-04-27 – 2021-04-28 (×3): 2 [IU] via SUBCUTANEOUS

## 2021-04-08 MED ORDER — MAGNESIUM CITRATE PO SOLN
1.0000 | Freq: Once | ORAL | Status: AC
  Administered 2021-04-08: 1 via ORAL
  Filled 2021-04-08: qty 296

## 2021-04-08 NOTE — Discharge Instructions (Addendum)
Inpatient Rehab Discharge Instructions  [name] Discharge date and time: No discharge date for patient encounter.   Activities/Precautions/ Functional Status: Activity: As tolerated Diet: regular Wound Care: Routine skin checks Functional status:  ___ No restrictions     ___ Walk up steps independently ___ 24/7 supervision/assistance   ___ Walk up steps with assistance ___ Intermittent supervision/assistance  ___ Bathe/dress independently ___ Walk with walker     __x_ Bathe/dress with assistance ___ Walk Independently    ___ Shower independently ___ Walk with assistance    ___ Shower with assistance ___ No alcohol     ___ Return to work/school ________  COMMUNITY REFERRALS UPON DISCHARGE:    Home Health:   PT      OT      ST     RN     SNA                         Agency: [facility]  Phone: [phone] *Please expect follow-up by Tuesday, July 5 to schedule your home visit. If you have not received follow-up, be sure to contact the site directly.*   Medical Equipment/Items Ordered:rolling walker and transport chair                                                 Agency/Supplier:[facility] [employer] [phone]    Special Instructions: No driving smoking or alcohol   My questions have been answered and I understand these instructions. I will adhere to these goals and the provided educational materials after my discharge from the hospital.  Patient/Caregiver Signature _______________________________ Date __________  Clinician Signature _______________________________________ Date __________  Information on my medicine - XARELTO (Rivaroxaban)  This medication education was reviewed with me or my healthcare representative as part of my discharge preparation.    Why was Xarelto prescribed for you? Xarelto was prescribed for you to reduce the risk of a blood clot forming that can cause a stroke if you have a medical condition called atrial fibrillation (a type of  irregular heartbeat).  What do you need to know about xarelto ? Take your Xarelto ONCE DAILY at the same time every day with your evening meal. If you have difficulty swallowing the tablet whole, you may crush it and mix in applesauce just prior to taking your dose.  Take Xarelto exactly as prescribed by your doctor and DO NOT stop taking Xarelto without talking to the doctor who prescribed the medication.  Stopping without other stroke prevention medication to take the place of Xarelto may increase your risk of developing a clot that causes a stroke.  Refill your prescription before you run out.  After discharge, you should have regular check-up appointments with your healthcare provider that is prescribing your Xarelto.  In the future your dose may need to be changed if your kidney function or weight changes by a significant amount.  What do you do if you miss a dose? If you are taking Xarelto ONCE DAILY and you miss a dose, take it as soon as you remember on the same day then continue your regularly scheduled once daily regimen the next day. Do not take two doses of Xarelto at the same time or on the same day.   Important Safety Information A possible side effect of Xarelto is bleeding. You should  call your healthcare provider right away if you experience any of the following: Bleeding from an injury or your nose that does not stop. Unusual colored urine (red or dark brown) or unusual colored stools (red or black). Unusual bruising for unknown reasons. A serious fall or if you hit your head (even if there is no bleeding).  Some medicines may interact with Xarelto and might increase your risk of bleeding while on Xarelto. To help avoid this, consult your healthcare provider or pharmacist prior to using any new prescription or non-prescription medications, including herbals, vitamins, non-steroidal anti-inflammatory drugs (NSAIDs) and supplements.  This website has more information  on Xarelto: [email or web address]  Please bring this form and your medication list with you to all your follow-up doctor's appointments.
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
## 2021-04-09 DIAGNOSIS — S065X0S Traumatic subdural hemorrhage without loss of consciousness, sequela: Secondary | ICD-10-CM

## 2021-04-09 LAB — CBC WITH DIFFERENTIAL/PLATELET
Abs Immature Granulocytes: 0.17 10*3/uL — ABNORMAL HIGH (ref 0.00–0.07)
Basophils Absolute: 0 10*3/uL (ref 0.0–0.1)
Basophils Relative: 0 %
Eosinophils Absolute: 0 10*3/uL (ref 0.0–0.5)
Eosinophils Relative: 0 %
HCT: 30.6 % — ABNORMAL LOW (ref 39.0–52.0)
Hemoglobin: 10.2 g/dL — ABNORMAL LOW (ref 13.0–17.0)
Immature Granulocytes: 3 %
Lymphocytes Relative: 20 %
Lymphs Abs: 1.2 10*3/uL (ref 0.7–4.0)
MCH: 32.4 pg (ref 26.0–34.0)
MCHC: 33.3 g/dL (ref 30.0–36.0)
MCV: 97.1 fL (ref 80.0–100.0)
Monocytes Absolute: 1.2 10*3/uL — ABNORMAL HIGH (ref 0.1–1.0)
Monocytes Relative: 19 %
Neutro Abs: 3.6 10*3/uL (ref 1.7–7.7)
Neutrophils Relative %: 58 %
Platelets: 218 10*3/uL (ref 150–400)
RBC: 3.15 MIL/uL — ABNORMAL LOW (ref 4.22–5.81)
RDW: 16.3 % — ABNORMAL HIGH (ref 11.5–15.5)
WBC: 6.2 10*3/uL (ref 4.0–10.5)
nRBC: 0 % (ref 0.0–0.2)

## 2021-04-09 LAB — COMPREHENSIVE METABOLIC PANEL
ALT: 19 U/L (ref 0–44)
AST: 17 U/L (ref 15–41)
Albumin: 2.9 g/dL — ABNORMAL LOW (ref 3.5–5.0)
Alkaline Phosphatase: 67 U/L (ref 38–126)
Anion gap: 8 (ref 5–15)
BUN: 16 mg/dL (ref 8–23)
CO2: 30 mmol/L (ref 22–32)
Calcium: 9.2 mg/dL (ref 8.9–10.3)
Chloride: 98 mmol/L (ref 98–111)
Creatinine, Ser: 1.09 mg/dL (ref 0.61–1.24)
GFR, Estimated: >60 mL/min (ref >60)
Glucose, Bld: 209 mg/dL — ABNORMAL HIGH (ref 70–99)
Potassium: 3.8 mmol/L (ref 3.5–5.1)
Sodium: 136 mmol/L (ref 135–145)
Total Bilirubin: 0.8 mg/dL (ref 0.3–1.2)
Total Protein: 6.2 g/dL — ABNORMAL LOW (ref 6.5–8.1)

## 2021-04-09 LAB — GLUCOSE, CAPILLARY
Glucose-Capillary: 195 mg/dL — ABNORMAL HIGH (ref 70–99)
Glucose-Capillary: 236 mg/dL — ABNORMAL HIGH (ref 70–99)
Glucose-Capillary: 288 mg/dL — ABNORMAL HIGH (ref 70–99)
Glucose-Capillary: 282 mg/dL — ABNORMAL HIGH (ref 70–99)

## 2021-04-09 MED ORDER — JUVEN PO PACK
1.0000 | PACK | Freq: Two times a day (BID) | ORAL | Status: DC
  Administered 2021-04-10 – 2021-04-20 (×20): 1 via ORAL
  Filled 2021-04-09 (×17): qty 1

## 2021-04-09 MED ORDER — AMITRIPTYLINE HCL 25 MG PO TABS
25.0000 mg | ORAL_TABLET | Freq: Every day | ORAL | Status: DC
  Administered 2021-04-09 – 2021-04-15 (×7): 25 mg via ORAL
  Filled 2021-04-09 (×7): qty 1

## 2021-04-09 NOTE — Progress Notes (Signed)
Physical Therapy TBI Note  Patient Details  Name: [name] MRN: [id number] Date of Birth: 01-28-1941  Today's Date: 04/09/2021 PT Individual Time: 1110-1205 PT Individual Time Calculation (min): 55 min   Short Term Goals: Week 1:  PT Short Term Goal 1 (Week 1): Pt will perform sit to stand to RW w/min assist PT Short Term Goal 2 (Week 1): Pt will maintain static stand w/RW w/min assist PT Short Term Goal 3 (Week 1): Pt will ambulate 18ft w/RW w/min assist of 1, wc follow for safety if needed PT Short Term Goal 4 (Week 1): pt will tolerate assessment of car transfer Week 2:    Week 3:     Skilled Therapeutic Interventions/Progress Updates:      Therapy Documentation Precautions:  Precautions Precautions: Fall, Other (comment) Precaution Comments: Sinus precautions, may not blow nose, may not use straw Restrictions Weight Bearing Restrictions: No Other Position/Activity Restrictions: see above, sinus precuations  Agitated Behavior Scale: TBI Observation Details Observation Environment: ROOM/GYM Start of observation period - Date: 04/09/21 Start of observation period - Time: 1105 (1105) End of observation period - Date: 04/09/21 End of observation period - Time: 1205 Agitated Behavior Scale (DO NOT LEAVE BLANKS) Short attention span, easy distractibility, inability to concentrate: Present to a slight degree Impulsive, impatient, low tolerance for pain or frustration: Absent Uncooperative, resistant to care, demanding: Absent Violent and/or threatening violence toward people or property: Absent Explosive and/or unpredictable anger: Absent Rocking, rubbing, moaning, or other self-stimulating behavior: Absent Pulling at tubes, restraints, etc.: Absent Wandering from treatment areas: Absent Restlessness, pacing, excessive movement: Absent Repetitive behaviors, motor, and/or verbal: Absent Rapid, loud, or excessive talking: Absent Sudden changes of mood: Absent Easily  initiated or excessive crying and/or laughter: Absent Self-abusiveness, physical and/or verbal: Absent Agitated behavior scale total score: 15Pain:  Pt reports "a little" pain. - headache.  Treatment to tolerance.  Rest breaks and repositioning as needed.  Pt initially sleeping, sowhat slow to awaken but does become fully alert given additional time.  Pt requested Systane lubricatiing eyedrops due to dry eyes, therapist provided (wife brought from home)  Pt lifts feet for therapist to thread pants then able to r> aise pants slowly but without assist/bridges to complete. Supine to side to sit w/min assist and additional time.  Nursing in to provide insulin. Pt Sit to stand w/min assist, bed raised.  Gait 8ft to  commode and commode transfer w/min assist, cues to turn fully prior to sitting, min assist for lowering pants.  Pt continent of urine, performs hygiene w/set up and min assist for balance in standing.  Mod assist to raise pants. Gait 10ft to wc, cues for posture, bilat knee wobbles w/ccasional hyperextension at R knee >L. Tends to progressivly flex esp via forward head/thoracic rounding. Pt washes hands, face, and grooms hair from wc level with set up only. Pt transported to gym for gait training. Sit to stand w/min assist from wc, slow to achieve upright. Gait 131ft, 37ft w/RW, min assist, cues for posture, decreased DF, wobbles vs HE w/loading R knee > L, slowed cadence, poor safety awareness w/turning to sit in wc/armchair.  Wife present in room prior to and following session.  Discussed scheduling, selfcare and importance of taking breaks so she can be well rested when pt ready for DC.  Discussed cognitive aspects of care, anticpated 24 hr supervision vs assist at DC, and detrmination of DC date at weekly conference.  Pt left oob in wc w/alarm belt set and  needs in reach   Therapy/Group: Individual Therapy Callie Fielding, [place] 04/09/2021, 12:26 PM
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
## 2021-04-09 NOTE — Progress Notes (Signed)
Patient information reviewed and entered into [facility] by Becky Mirissa Lopresti, PPS coordinator. Information including medical coding, function ability, and quality indicators will be reviewed and updated through discharge.   

## 2021-04-09 NOTE — Progress Notes (Signed)
Physical Therapy TBI Note  Patient Details  Name: [name] MRN: [id number] Date of Birth: [birth year]  Today's Date: 04/09/2021 PT Individual Time: 1300-1400 PT Individual Time Calculation (min): 60 min   Short Term Goals: Week 1:  PT Short Term Goal 1 (Week 1): Pt will perform sit to stand to RW w/min assist PT Short Term Goal 2 (Week 1): Pt will maintain static stand w/RW w/min assist PT Short Term Goal 3 (Week 1): Pt will ambulate 33ft w/RW w/min assist of 1, wc follow for safety if needed PT Short Term Goal 4 (Week 1): pt will tolerate assessment of car transfer  Skilled Therapeutic Interventions/Progress Updates:     Patient in w/c with his wife in the room upon PT arrival. Patient alert and agreeable to PT session. Patient denied pain during session.  Focused beginning of session on patient education and discussion of neuropathic foot pain from peripheral neuropathy, foot care, and impact of sensory and strength deficits on balance, as there is notable atrophy of his R PFs on observation. Also educated on TBI signs/symptoms, recovery, interventions during therapy, present deficits, and impact of good quality sleep and activity to promote recovery.   Vitals: Sitting: BP 98/49, HR 72 Standing: BP 131/91, HR 97  Therapeutic Activity: Bed Mobility: Patient performed sit to supine with supervision with min use of bed rail in a flat bed. Provided verbal cues for patient to initiate mobility without assist, and bring knees to chest to lift lower extremities onto the bed. Transfers: Patient performed sit to/from stand x3 and stand pivot x1 with min A and increased time due to decreased lower extremity strength with boosting up using a RW. Provided verbal cues for forward weight shift, hand placement, and increased gluteal activation to promote knee/hip/trunk extension. Patient stood x2 min before requesting to sit due to fatigue while obtaining BP (denied symptoms).   Gait Training:   Patient ambulated 72 feet using RW with min A and +2 w/c follow due to decreased activity tolerance. Ambulated with decreased gait speed, decreased step length and height, increased knee extension in stance to compensate for lower extremity weakness, increased forward trunk lean, and downward head gaze. Provided verbal cues for erect posture, looking ahead, proximity to RW, and increased lateral hip activation in stance for improved stability. Patient reported significant fatigue following gait trial and was unable to recover to perform a second trial. Patient expressed concern about his limited progress this afternoon. Educated on reduced assist levels since admission and increased activity tolerance throughout the day to tolerate 3+ hours of therapy.   Patient sitting up in bed with his wife in the room at end of session with breaks locked, bed alarm set, and all needs within reach.   Therapy Documentation Precautions:  Precautions Precautions: Fall, Other (comment) Precaution Comments: Sinus precautions, may not blow nose, may not use straw Restrictions Weight Bearing Restrictions: No Other Position/Activity Restrictions: see above, sinus precuations  Agitated Behavior Scale: TBI Observation Details Observation Environment: Patient room Start of observation period - Date: 04/09/21 Start of observation period - Time: 1415 End of observation period - Date: 04/09/21 End of observation period - Time: 1500 Agitated Behavior Scale (DO NOT LEAVE BLANKS) Short attention span, easy distractibility, inability to concentrate: Present to a slight degree Impulsive, impatient, low tolerance for pain or frustration: Absent Uncooperative, resistant to care, demanding: Absent Violent and/or threatening violence toward people or property: Absent Explosive and/or unpredictable anger: Absent Rocking, rubbing, moaning, or other self-stimulating behavior:  Absent Pulling at tubes, restraints, etc.:  Absent Wandering from treatment areas: Absent Restlessness, pacing, excessive movement: Absent Repetitive behaviors, motor, and/or verbal: Absent Rapid, loud, or excessive talking: Absent Sudden changes of mood: Absent Easily initiated or excessive crying and/or laughter: Absent Self-abusiveness, physical and/or verbal: Absent Agitated behavior scale total score: 15    Therapy/Group: Individual Therapy  Sheketa Ende L Baelynn Schmuhl PT, DPT  04/09/2021, 3:30 PM

## 2021-04-09 NOTE — Progress Notes (Signed)
Speech Language Pathology TBI Note  Patient Details  Name: [name] MRN: [id number] Date of Birth: [birth year]  Today's Date: 04/09/2021 SLP Individual Time: 1415-1500 SLP Individual Time Calculation (min): 45 min  Short Term Goals: Week 1: SLP Short Term Goal 1 (Week 1): Pt will recall functional and novel information with min A cues SLP Short Term Goal 2 (Week 1): Pt will ID 3 physical and/or cognitive changes and their implications following TBI with min A SLP Short Term Goal 3 (Week 1): Pt will complete mildly complex problem solving tasks with min A SLP Short Term Goal 4 (Week 1): Pt will complete money management tasks with min A  Skilled Therapeutic Interventions:     Skilled SLP treatment performed with focus on cognitive goals. SLP facilitated short-term recall activity with Mod A verbal cues. SLP provided training on compensatory memory strategies for improved recall during activity involving categorization, chunking information, and visualization. With use of strategies, patient recalled 7/10 items after 5 minute delay, progressing to 9/10 items with Min A semantic cues. Patient demonstrated decreased working memory at conversation level with therapist and did not appear aware that information had been repeated during several instances. SLP assessed money counting using ALFA (only able to complete 1 task due to time constraints). Required extended processing time and Mod verbal and visual support for accuracy. Patient stated "that is elementary" and did not appear aware or acknowledge the task was challenging for him. He appears to display overall decreased insight into deficits. Patient was left in bed with alarm activated and needs within reach. Continue with current plan of care.  Pain Pain Assessment Pain Scale: 0-10 Pain Score: 0-No pain  Agitated Behavior Scale: TBI Observation Details Observation Environment: Patient room Start of observation period - Date:  04/09/21 Start of observation period - Time: 1415 End of observation period - Date: 04/09/21 End of observation period - Time: 1500 Agitated Behavior Scale (DO NOT LEAVE BLANKS) Short attention span, easy distractibility, inability to concentrate: Present to a slight degree Impulsive, impatient, low tolerance for pain or frustration: Absent Uncooperative, resistant to care, demanding: Absent Violent and/or threatening violence toward people or property: Absent Explosive and/or unpredictable anger: Absent Rocking, rubbing, moaning, or other self-stimulating behavior: Absent Pulling at tubes, restraints, etc.: Absent Wandering from treatment areas: Absent Restlessness, pacing, excessive movement: Absent Repetitive behaviors, motor, and/or verbal: Absent Rapid, loud, or excessive talking: Absent Sudden changes of mood: Absent Easily initiated or excessive crying and/or laughter: Absent Self-abusiveness, physical and/or verbal: Absent Agitated behavior scale total score: 15  Therapy/Group: Individual Therapy  [name] 04/09/2021, 3:36 PM
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
## 2021-04-09 NOTE — Progress Notes (Signed)
Occupational Therapy TBI Note  Patient Details  Name: LYSLE YERO MRN: [id number] Date of Birth: 1940/12/28  Today's Date: 04/09/2021 OT Individual Time: 4765-4650 OT Individual Time Calculation (min): 70 min    Short Term Goals: Week 1:  OT Short Term Goal 1 (Week 1): Pt will thread BLE into pants OT Short Term Goal 2 (Week 1): Pt will sequence bathing body parts with no more than min cuing OT Short Term Goal 3 (Week 1): Pt will groom wiht no VC OT Short Term Goal 4 (Week 1): Pt will trnasfer to toilet/BSC with MOD A  Skilled Therapeutic Interventions/Progress Updates:    Pt received supine with 2/10 pain in his B feet, described as chronic neuropathy. Discussed medications and prior pain management techniques- as well as long hx of pain in his back and feet. Spent additional time building therapeutic rapport. Pt completed bed mobility to EOB with CGA. He had difficulty making decisions throughout session, requiring cueing for initiation. Pt completed sit > stand from EOB with mod A using RW. Stand pivot transfer to the w/c with mod A using RW. He transferred to TTB in walk in shower with mod cueing for sequencing/technique and mod A. He expressed concern about modesty and extra measures were taken to make pt feel comfortable, including keeping a towel on his lap and pt not wanting to wash his bottom in standing, stating he will get nursing to help him. Pt donned shirt with CGA, min cueing required to terminate drying off and to initiate dressing. Pt required max A to don boxers d/t fatigue setting in. He transferred back to w/c with mod A and then back to supine in bed. Noticed several straws in the room in cups so posted sign to ensure carryover of adherence to sinus precautions of no straws. Pt left supine, already sleeping, bed alarm set.   Therapy Documentation Precautions:  Precautions Precautions: Fall, Other (comment) Precaution Comments: Sinus precautions, may not blow nose, may not  use straw Restrictions Weight Bearing Restrictions: No Other Position/Activity Restrictions: see above, sinus precuations  Agitated Behavior Scale: TBI Observation Details Observation Environment: pt room Start of observation period - Date: 04/09/21 Start of observation period - Time: 0734 End of observation period - Date: 04/09/21 End of observation period - Time: 0844 Agitated Behavior Scale (DO NOT LEAVE BLANKS) Short attention span, easy distractibility, inability to concentrate: Present to a slight degree Impulsive, impatient, low tolerance for pain or frustration: Absent Uncooperative, resistant to care, demanding: Absent Violent and/or threatening violence toward people or property: Absent Explosive and/or unpredictable anger: Absent Rocking, rubbing, moaning, or other self-stimulating behavior: Absent Pulling at tubes, restraints, etc.: Absent Wandering from treatment areas: Absent Restlessness, pacing, excessive movement: Absent Repetitive behaviors, motor, and/or verbal: Absent Rapid, loud, or excessive talking: Absent Sudden changes of mood: Absent Easily initiated or excessive crying and/or laughter: Absent Self-abusiveness, physical and/or verbal: Absent Agitated behavior scale total score: 15    Therapy/Group: Individual Therapy  [name] 04/09/2021, 8:45 AM

## 2021-04-10 LAB — GLUCOSE, CAPILLARY
Glucose-Capillary: 224 mg/dL — ABNORMAL HIGH (ref 70–99)
Glucose-Capillary: 245 mg/dL — ABNORMAL HIGH (ref 70–99)
Glucose-Capillary: 181 mg/dL — ABNORMAL HIGH (ref 70–99)
Glucose-Capillary: 295 mg/dL — ABNORMAL HIGH (ref 70–99)

## 2021-04-10 MED ORDER — GLIMEPIRIDE 2 MG PO TABS
2.0000 mg | ORAL_TABLET | Freq: Once | ORAL | Status: AC
  Administered 2021-04-10: 2 mg via ORAL
  Filled 2021-04-10: qty 1

## 2021-04-10 MED ORDER — GLIMEPIRIDE 2 MG PO TABS
4.0000 mg | ORAL_TABLET | Freq: Every day | ORAL | Status: DC
  Administered 2021-04-11 – 2021-04-23 (×13): 4 mg via ORAL
  Filled 2021-04-10 (×13): qty 2
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
## 2021-04-10 NOTE — Patient Care Conference (Signed)
Inpatient RehabilitationTeam Conference and Plan of Care Update Date: 04/10/2021   Time: 10:11 AM    Patient Name: [name]      Medical Record Number: [id number]  Date of Birth: [birth year] Sex: Male         Room/Bed: 4W14C/4W14C-01 Payor Info: Payor: [employer] MEDICARE / Plan: [facility] MEDICARE / Product Type: *No Product type* /    Admit Date/Time:  04/06/2021  2:33 PM  Primary Diagnosis:  Traumatic subdural hematoma [facility])  Hospital Problems: Principal Problem:   Traumatic subdural hematoma [facility])    Expected Discharge Date: Expected Discharge Date: 04/28/21  Team Members Present: Physician leading conference: Dr. Leeroy Cha Care Coodinator Present: Erlene Quan, BSW;Ronnie Mallette Creig Hines, RN, BSN, [place] Nurse Present: Dorthula Nettles, RN PT Present: Apolinar Junes, PT OT Present: Laverle Hobby, OT SLP Present: Weston Anna, SLP PPS Coordinator present : Ileana Ladd, PT     Current Status/Progress Goal Weekly Team Focus  Bowel/Bladder   Patient is continent of bowel/bladder  Patient will remain continent of bowel/bladder  Will assess qshift and PRN   Swallow/Nutrition/ Hydration             ADL's   Max A LB ADLs, supervision UB ADLs, mod A ADL transfers, RLAS VII  supervision overall  ADL retraining, transfers, cognitive retraining, awareness, d/c planning   Mobility   Min A overall, gait >100 ft with RW, fatigues quickly, peripheral neuropathy with notable atrophy of distal limb musculature and decreased sensation limiting balance/stability in standing  Supervision overall  Activity tolerance, balance, strengthening, attention, functional mobility, gait and stair training, patient/caregiver education   Communication             Safety/Cognition/ Behavioral Observations  [place] Level VI-VII, Min-Mod A  Supervision  complex problem solving, recall and awareness of deficits/errors   Pain   Patient reports pain 6 out of 0-10  Patient's pain will be no  more than 3 out of 0-10  Will assess qshift and PRN   Skin   Patient's skin is intact  Patient's skin will remain intact  Will assess qshift and PRN     Discharge Planning:  Pt to d/c to home with his wife who will provide 24/7 care.   Team Discussion: Constipation, elevated blood sugars, increased Amitriptyline. Continent B/B, has neuropathy to feet which causes burning and tingling. Awakened by the pain. Ecchymosis to the left face, arm, and thigh.   Patient on target to meet rehab goals: yes, max assist for lower body ADL's, supervision for upper body ADL's, has supervision goals. Min assist transfers, contact guard with RW > 200 ft. Noticeable muscle atrophy in the calf muscle. Decreased awareness of deficits, errors. Working on problem solving and recall. Goals are supervision.  *See Care Plan and progress notes for long and short-term goals.   Revisions to Treatment Plan:  Restart Amaryl, replace Boost with Juven.  Teaching Needs: Family education, medication management, pain management, diabetes education, skin/wound care, transfer training, gait training, balance training, endurance training, stair training, safety awareness.  Current Barriers to Discharge: Decreased caregiver support, Medical stability, Home enviroment access/layout, Wound care, Lack of/limited family support, Weight, Weight bearing restrictions, Medication compliance, Behavior, and Nutritional means  Possible Resolutions to Barriers: Continue current medications, provide emotional support.     Medical Summary Current Status: Diabetic peripheral neuropathy with uncontrolled CBGs, insomnia, HTN, ecchymoses to face, constipation, hypoalbuminemia  Barriers to Discharge: Medical stability;Wound care  Barriers to Discharge Comments: Diabetic peripheral neuropathy with uncontrolled CBGs,  insomnia, HTN, ecchymoses to face, constipation, hypoalbuminemia Possible Resolutions to [employer] Focus: Restarted Amaryl,  replaced Boost with Juven, increased Amitriptyline HS, continue Lopressor and Lasix, discussed Qutenza as an outpatient option for pain control, received mag citrate/continue to monitor for constipation   Continued Need for Acute Rehabilitation Level of Care: The patient requires daily medical management by a physician with specialized training in physical medicine and rehabilitation for the following reasons: Direction of a multidisciplinary physical rehabilitation program to maximize functional independence : Yes Medical management of patient stability for increased activity during participation in an intensive rehabilitation regime.: Yes Analysis of laboratory values and/or radiology reports with any subsequent need for medication adjustment and/or medical intervention. : Yes   I attest that I was present, lead the team conference, and concur with the assessment and plan of the team.   Cristi Loron 04/10/2021, 12:55 PM
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
## 2021-04-10 NOTE — Progress Notes (Signed)
Speech Language Pathology TBI Note  Patient Details  Name: [name] MRN: [id number] Date of Birth: 1941/05/31  Today's Date: 04/10/2021 SLP Individual Time: 1300-1345 SLP Individual Time Calculation (min): 45 min  Short Term Goals: Week 1: SLP Short Term Goal 1 (Week 1): Pt will recall functional and novel information with min A cues SLP Short Term Goal 2 (Week 1): Pt will ID 3 physical and/or cognitive changes and their implications following TBI with min A SLP Short Term Goal 3 (Week 1): Pt will complete mildly complex problem solving tasks with min A SLP Short Term Goal 4 (Week 1): Pt will complete money management tasks with min A  Skilled Therapeutic Interventions: Skilled SLP treatment performed with focus on cognitive goals. SLP facilitated continuation of ALFA counting money subtest. Patient performed with 60% accuracy without cues progressing to 80% accuracy with Mod A verbal cues. Benefited from verbal redirection, repetition of prompts and task directions, and counting money aloud. Patient initially displayed some resistance toward completing the task by stating "this is stupid" and "this is for 2nd graders." However, after experiencing some difficulty and education on the effectiveness from a cognitive standpoint, he acknowledged "I see why this is important" and verbalized improved insight into his cognitive deficits (I.e., slower processing, short-term recall). SLP facilitated focused attention, reasoning, and information processing task using BLINK card game with min-to-mod verbal and visual cues. Required intermittent reminders of task directions throughout. Patient was left in bed with alarm activated and all needs within reach. Continue with current plan of care.   Pain Pain Assessment Pain Scale: 0-10 Pain Score: 0-No pain  Agitated Behavior Scale: TBI Observation Details Observation Environment: Patient room Start of observation period - Date: 04/10/21 Start of  observation period - Time: 1300 End of observation period - Date: 04/10/21 End of observation period - Time: 1345 Agitated Behavior Scale (DO NOT LEAVE BLANKS) Short attention span, easy distractibility, inability to concentrate: Present to a slight degree Impulsive, impatient, low tolerance for pain or frustration: Absent Uncooperative, resistant to care, demanding: Absent Violent and/or threatening violence toward people or property: Absent Explosive and/or unpredictable anger: Absent Rocking, rubbing, moaning, or other self-stimulating behavior: Absent Pulling at tubes, restraints, etc.: Absent Wandering from treatment areas: Absent Restlessness, pacing, excessive movement: Absent Repetitive behaviors, motor, and/or verbal: Absent Rapid, loud, or excessive talking: Absent Sudden changes of mood: Absent Easily initiated or excessive crying and/or laughter: Absent Self-abusiveness, physical and/or verbal: Absent Agitated behavior scale total score: 15  Therapy/Group: Individual Therapy  [name] 04/10/2021, 3:16 PM

## 2021-04-10 NOTE — Progress Notes (Signed)
Patient ID: [name], male   DOB: 10-14-1941, 80 y.o.   MRN: [id number] Team Conference Report to Patient/Family  Team Conference discussion was reviewed with the patient and caregiver, including goals, any changes in plan of care and target discharge date.  Patient and caregiver express understanding and are in agreement.  The patient has a target discharge date of 04/28/21.  Covering for primary SW, Auria   SW met with patient and called pt spouse at bedside (left VM), introduced self, provided patient with updates. Patient happy with updates, having concerns with food on lunch tray   Dyanne Iha 04/10/2021, 1:52 PM

## 2021-04-10 NOTE — Progress Notes (Signed)
Occupational Therapy TBI Note  Patient Details  Name: [name] MRN: [id number] Date of Birth: 10/01/1941  Today's Date: 04/10/2021 OT Individual Time: 2376-2831 OT Individual Time Calculation (min): 53 min    Short Term Goals: Week 1:  OT Short Term Goal 1 (Week 1): Pt will thread BLE into pants OT Short Term Goal 2 (Week 1): Pt will sequence bathing body parts with no more than min cuing OT Short Term Goal 3 (Week 1): Pt will groom wiht no VC OT Short Term Goal 4 (Week 1): Pt will trnasfer to toilet/BSC with MOD A  Skilled Therapeutic Interventions/Progress Updates:    Pt received sitting EOB with nursing staff, just finished bathroom transfer. Pt reported fatigue but no c/o pain. Pt completed stand pivot transfer to the w/c with mod A to stand, min A to pivot. Pt was transported via w/c to the therapy gym. He completed therapeutic activity focused on functional activity tolerance and dynamic standing balance. He required mod A to stand from EOM and min A to remain standing with reciprocal foot tapping onto 5 in step. X3 repetitions, with pt requiring less assist to stand each trial, progressing to CGA to stand. Remainder of session focused on cognitive retraining- including sequencing, working memory, and sustained/selective attention using the BITS system. He required mod cueing overall for sequencing when alternating between number/letter sequences and for attention to task. 3 item sequence accuracy in memory was 75% with min cueing as well. Pt returned to his room and completed ambulatory transfer back to bed, 10 ft, requiring min A and mod cueing for RW management and sequencing turn. Pt was left supine with all needs met. Bed alarm set. 7 min missed d/t fatigue.   Therapy Documentation Precautions:  Precautions Precautions: Fall, Other (comment) Precaution Comments: Sinus precautions, may not blow nose, may not use straw Restrictions Weight Bearing Restrictions: No Other  Position/Activity Restrictions: see above, sinus precuations Agitated Behavior Scale: TBI Observation Details Observation Environment: CIR Start of observation period - Date: 04/10/21 Start of observation period - Time: 1030 End of observation period - Date: 04/10/21 End of observation period - Time: 1130 Agitated Behavior Scale (DO NOT LEAVE BLANKS) Short attention span, easy distractibility, inability to concentrate: Present to a slight degree Impulsive, impatient, low tolerance for pain or frustration: Absent Uncooperative, resistant to care, demanding: Absent Violent and/or threatening violence toward people or property: Absent Explosive and/or unpredictable anger: Absent Rocking, rubbing, moaning, or other self-stimulating behavior: Absent Pulling at tubes, restraints, etc.: Absent Wandering from treatment areas: Absent Restlessness, pacing, excessive movement: Absent Repetitive behaviors, motor, and/or verbal: Absent Rapid, loud, or excessive talking: Absent Sudden changes of mood: Absent Easily initiated or excessive crying and/or laughter: Absent Self-abusiveness, physical and/or verbal: Absent Agitated behavior scale total score: 15    Therapy/Group: Individual Therapy  [name] 04/10/2021, 11:33 AM

## 2021-04-11 DIAGNOSIS — I10 Essential (primary) hypertension: Secondary | ICD-10-CM

## 2021-04-11 LAB — GLUCOSE, CAPILLARY
Glucose-Capillary: 172 mg/dL — ABNORMAL HIGH (ref 70–99)
Glucose-Capillary: 261 mg/dL — ABNORMAL HIGH (ref 70–99)
Glucose-Capillary: 215 mg/dL — ABNORMAL HIGH (ref 70–99)
Glucose-Capillary: 308 mg/dL — ABNORMAL HIGH (ref 70–99)

## 2021-04-11 MED ORDER — SORBITOL 70 % SOLN
60.0000 mL | Status: AC
  Administered 2021-04-11: 60 mL via ORAL
  Filled 2021-04-11: qty 60

## 2021-04-11 MED ORDER — FLEET ENEMA 7-19 GM/118ML RE ENEM
1.0000 | ENEMA | Freq: Every day | RECTAL | Status: DC | PRN
  Administered 2021-04-11 – 2021-04-22 (×2): 1 via RECTAL
  Filled 2021-04-11 (×2): qty 1

## 2021-04-11 MED ORDER — QUETIAPINE FUMARATE 25 MG PO TABS
25.0000 mg | ORAL_TABLET | Freq: Two times a day (BID) | ORAL | Status: DC
  Administered 2021-04-11 – 2021-04-13 (×4): 25 mg via ORAL
  Filled 2021-04-11 (×4): qty 1

## 2021-04-11 NOTE — Progress Notes (Signed)
Speech Language Pathology Daily Session Note  Patient Details  Name: [name] MRN: [id number] Date of Birth: Nov 13, 1940  Today's Date: 04/11/2021 SLP Missed Time: 54 Minutes Missed Time Reason: Patient unwilling to participate;Patient fatigue  Short Term Goals: Week 1: SLP Short Term Goal 1 (Week 1): Pt will recall functional and novel information with min A cues SLP Short Term Goal 2 (Week 1): Pt will ID 3 physical and/or cognitive changes and their implications following TBI with min A SLP Short Term Goal 3 (Week 1): Pt will complete mildly complex problem solving tasks with min A SLP Short Term Goal 4 (Week 1): Pt will complete money management tasks with min A  Skilled Therapeutic Interventions: Patient refused ST treatment after multiple SLP attempts secondary lethargy and bowel related discomfort. NSG aware. Patient left in bed with alarm activated and needs within reach.   Therapy/Group: Individual Therapy  [name] 04/11/2021, 3:48 PM

## 2021-04-11 NOTE — Progress Notes (Signed)
Patient ID: [name], male   DOB: November 03, 1940, 80 y.o.   MRN: [id number]  Covering for primary SW, Auria  Sw called spouse provided conference updates. Pt asking if pt will require 24/7 spouse informed goals are currently supervision. Pt friend son coming up to visit patient today. No additional questions or concerns. SW will follow up  Erlene Quan, [place]
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
## 2021-04-11 NOTE — Progress Notes (Signed)
While patient was in therapy he stated to the therapist that he feels like his chest is tightening and shortness of breath.Therapist took vitals. Vitals are stable and patient has no pain.  Idamae Schuller, LPN
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
## 2021-04-11 NOTE — Plan of Care (Signed)
Behavioral Plan   Rancho Level: Rancho VII  Behavior to decrease/ eliminate: execute safety plan; decrease fall risk   Changes to environment:  Blinds open during day, shut at night Offer TV off at night time to encourage sleep Chair alarm when OOB  Interventions: Sleep/wake chart Allow pt to sit EOB but ensure pt bed alarm on exiting mode Provide rest between therapy sessions  Recommendations for interactions with patient: Offer toileting every encounter Call light in reach at every encounter    Attendees:  Courtney P. SLP Katharine Look D.OT Cherie Darnell Level PT Jae Dire OT
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
## 2021-04-11 NOTE — Progress Notes (Signed)
Occupational Therapy TBI Note  Patient Details  Name: [name] MRN: [id number] Date of Birth: December 28, 1940  Today's Date: 04/11/2021 OT Individual Time: 1655-3748 OT Individual Time Calculation (min): 61 min    Short Term Goals: Week 1:  OT Short Term Goal 1 (Week 1): Pt will thread BLE into pants OT Short Term Goal 2 (Week 1): Pt will sequence bathing body parts with no more than min cuing OT Short Term Goal 3 (Week 1): Pt will groom wiht no VC OT Short Term Goal 4 (Week 1): Pt will trnasfer to toilet/BSC with MOD A  Skilled Therapeutic Interventions/Progress Updates:    Pt received supine with c/o 2/10 headache. Extensive discussion re pain management in relation to pt's chronic BLE neuropathy pain and current headache. Discussed alternative pain management techniques and timing of medications. Pt completed bed mobility to EOB with min A. Pt completed ambulatory transfer to the sink using the RW with min A. In standing he completed oral care with min guard with UE support on the sink. He required seated rest break halfway through grooming tasks d/t fatigue. He was taken via w/c to the therapy gym. He completed stand pivot transfer to the mat with min A using RW and min cueing for hand placement. Blocked practice sit <> stands with changing UE support, min A overall, with cueing provided for focused concentric lowering to the mat 3x6 repetitions. Pt then completed BUE strengthening circuit with a 1kg medicine ball with chest press and overhead press with verbal/tactile cueing for upright posture, 3x8 repetitions. Pt returned to his room via w/c.  Pt began telling OT about incident he had last night where he experienced chest tightness and SOB, and stated this was happening again right now. He denied any chest pain, just tightness. HR 78, SpO2 100%. Per pt suggestion, used the ECG feature on his apple watch (encouraged by his cardiologist) - which produced a normal reading. Pt's nurse Glenard Haring was  alerted to this. Pt returned to supine in bed, all needs met, bed alarm set.   Therapy Documentation Precautions:  Precautions Precautions: Fall, Other (comment) Precaution Comments: Sinus precautions, may not blow nose, may not use straw Restrictions Weight Bearing Restrictions: No Other Position/Activity Restrictions: see above, sinus precuations  Agitated Behavior Scale: TBI Observation Details Observation Environment: CIR Start of observation period - Date: 04/11/21 Start of observation period - Time: 0730 End of observation period - Date: 04/11/21 End of observation period - Time: 0830 Agitated Behavior Scale (DO NOT LEAVE BLANKS) Short attention span, easy distractibility, inability to concentrate: Absent Impulsive, impatient, low tolerance for pain or frustration: Absent Uncooperative, resistant to care, demanding: Absent Violent and/or threatening violence toward people or property: Absent Explosive and/or unpredictable anger: Absent Rocking, rubbing, moaning, or other self-stimulating behavior: Absent Pulling at tubes, restraints, etc.: Absent Wandering from treatment areas: Absent Restlessness, pacing, excessive movement: Absent Repetitive behaviors, motor, and/or verbal: Absent Rapid, loud, or excessive talking: Absent Sudden changes of mood: Absent Easily initiated or excessive crying and/or laughter: Absent Self-abusiveness, physical and/or verbal: Absent Agitated behavior scale total score: 14    Therapy/Group: Individual Therapy  [name] 04/11/2021, 8:44 AM

## 2021-04-11 NOTE — Progress Notes (Addendum)
Physical Therapy Session Note  Patient Details  Name: [name] MRN: [id number] Date of Birth: 12-16-1940  Today's Date: 04/11/2021 PT Individual Time: 0900-1000 PT Individual Time Calculation (min): 60 min   Short Term Goals: Week 1:  PT Short Term Goal 1 (Week 1): Pt will perform sit to stand to RW w/min assist PT Short Term Goal 2 (Week 1): Pt will maintain static stand w/RW w/min assist PT Short Term Goal 3 (Week 1): Pt will ambulate 71ft w/RW w/min assist of 1, wc follow for safety if needed PT Short Term Goal 4 (Week 1): pt will tolerate assessment of car transfer  Skilled Therapeutic Interventions/Progress Updates:     Patient sitting EOB upon PT arrival. Patient alert and agreeable to PT session. Patient denied pain during session. Patient reported an incident of SOB with tightness in his chest last night and with OT this morning, per OT note vitals stable, patient denied symptoms at beginning of session.  Patient tangential and verbose throughout session. Patient demonstrated poor awareness of deficits from TBI during session. Discussed use of AFOs to improve gait and reduce fall risk, patient very resistive to using any braces due to wanting to return to driving at d/c. Educated on current motor and cognitive deficit that may limit returning to driving. Patient reported that he had taken a driving test and passed and that he has proven that he is capable of driving and not going to do that again.   Vitals: HR 70's, SPO2 >94% throughout session  Patient donned socks EOB with set-up assist and supervision for balance, required max A to don shoes despite patient's effort to follow therapist's cues for management of heel and maintaining figure four sitting to tie his shoes.   Therapeutic Activity: Bed Mobility: Patient performed supine to/from sit with supervision in a flat bed without use of bed rails. Provided verbal cues for practice simulating home set-up and use of log roll  technique to reduce abdominal strain to come to sitting. Transfers: Patient performed sit to/from stand x4 and stand pivot w/c>bed with CGA using RW. Provided verbal cues for forward weight shift x2, and reaching back to sit x3.  Gait Training:  Patient ambulated >150 feet using RW with CGA and w/c fallow for safety due to decreased activity tolerance and hx of R knee buckling. Ambulated with R lateral knee thrust, flexed posture, downward head gaze, decreased B DF, decreased gastroc activation in push-of, and decreased step height, step length, and gait speed. Provided verbal cues for erect posture, visual scanning of environment, proximity to RW, and increased propulsion and gait speed.  Wheelchair Mobility:  Patient propelled wheelchair 35 feet with B upper/lower extremities, then 20 feet using only B upper extremities, limited by fatigue. Provided verbal cues for increased stroke length, coordination of all extremities working together, and using B upper extremities at the same time with the same force, as patient tends to veer L, nearly running into the wall, stopped with cues.   Neuromuscular Re-ed: Patient performed the following lower extremity motor control activities: -attempted standing heel raises with B upper extremity support on RW, patient unable to lift his heels resulting in knee flexion instead due to gastroc weakness -seated heel/toe raises 2x10 focused on pushing his toes into the ground to lift his heels for increased gastroc activation, educated on performing in the room in sitting and lying throughout the day -step-ups on 6" step R/L 2x5 with B upper extremity support with CGA-min A on R  and min A-mod A on L  Patient in bed due to increased fatigue at end of session with breaks locked, bed alarm set, and all needs within reach.   Therapy Documentation Precautions:  Precautions Precautions: Fall, Other (comment) Precaution Comments: Sinus precautions, may not blow nose, may  not use straw Restrictions Weight Bearing Restrictions: No Other Position/Activity Restrictions: see above, sinus precuations Agitated Behavior Scale: TBI Observation Details Observation Environment: CIR Start of observation period - Date: 04/11/21 Start of observation period - Time: 0730 End of observation period - Date: 04/11/21 End of observation period - Time: 0830 Agitated Behavior Scale (DO NOT LEAVE BLANKS) Short attention span, easy distractibility, inability to concentrate: Present to a slight degree Impulsive, impatient, low tolerance for pain or frustration: Absent Uncooperative, resistant to care, demanding: Absent Violent and/or threatening violence toward people or property: Absent Explosive and/or unpredictable anger: Absent Rocking, rubbing, moaning, or other self-stimulating behavior: Absent Pulling at tubes, restraints, etc.: Absent Wandering from treatment areas: Absent Restlessness, pacing, excessive movement: Absent Repetitive behaviors, motor, and/or verbal: Absent Rapid, loud, or excessive talking: Present to a slight degree Sudden changes of mood: Absent Easily initiated or excessive crying and/or laughter: Absent Self-abusiveness, physical and/or verbal: Absent Agitated behavior scale total score: 16     Therapy/Group: Individual Therapy  Henretter Piekarski L Preethi Scantlebury PT, DPT  04/11/2021, 6:42 PM

## 2021-04-12 LAB — GLUCOSE, CAPILLARY
Glucose-Capillary: 203 mg/dL — ABNORMAL HIGH (ref 70–99)
Glucose-Capillary: 191 mg/dL — ABNORMAL HIGH (ref 70–99)
Glucose-Capillary: 205 mg/dL — ABNORMAL HIGH (ref 70–99)
Glucose-Capillary: 274 mg/dL — ABNORMAL HIGH (ref 70–99)

## 2021-04-12 MED ORDER — METFORMIN HCL 500 MG PO TABS
500.0000 mg | ORAL_TABLET | Freq: Two times a day (BID) | ORAL | Status: DC
  Administered 2021-04-12 – 2021-04-14 (×4): 500 mg via ORAL
  Filled 2021-04-12 (×4): qty 1
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
## 2021-04-12 NOTE — Progress Notes (Addendum)
Speech Language Pathology TBI Note  Patient Details  Name: [name] MRN: [id number] Date of Birth: 06-06-1941  Today's Date: 04/12/2021 SLP Individual Time: 1100-1200 SLP Individual Time Calculation (min): 60 min  Short Term Goals: Week 1: SLP Short Term Goal 1 (Week 1): Pt will recall functional and novel information with min A cues SLP Short Term Goal 2 (Week 1): Pt will ID 3 physical and/or cognitive changes and their implications following TBI with min A SLP Short Term Goal 3 (Week 1): Pt will complete mildly complex problem solving tasks with min A SLP Short Term Goal 4 (Week 1): Pt will complete money management tasks with min A  Skilled Therapeutic Interventions: Skilled SLP treatment performed with focus on cognitive goals. SLP facilitated short-term recall and progressive sequencing task using BITS technology achieving overall 75% accuracy during 5/5 occasions with Mod verbal and visual cues. Patient was able to sustain attention for up to 5 minutes intervals within mildly distracting environment (therapy gym with quiet side conversation in background) during 4 occasions. Patient exhibited increased errors and frustration when environment became moderately-to-highly distracting (multiple people in room talking at once, people entering and leaving room) resulting in change of environment. Facilitated alternating attention and verbal reasoning task within quiet, low stim environment with mod verbal cues progressing to max cues possibly secondary to fatigue. Educated patient's spouse on today's performance, and strategies to enhance attention (minimize environmental distractions, reducing verbal stimuli and side conversation during functional tasks, etc). Spouse displayed understanding through accurate verbal teach back. Patient was left in wheelchair with alarm activated and NSG present. Continue with therapy per plan of care.  Pain Pain Assessment Pain Scale: 0-10 Pain Score: 0-No  pain  Agitated Behavior Scale: TBI Observation Details Observation Environment: CIR Start of observation period - Date: 04/12/21 Start of observation period - Time: 1100 End of observation period - Date: 04/12/21 End of observation period - Time: 1200 Agitated Behavior Scale (DO NOT LEAVE BLANKS) Short attention span, easy distractibility, inability to concentrate: Present to a slight degree Impulsive, impatient, low tolerance for pain or frustration: Absent Uncooperative, resistant to care, demanding: Absent Violent and/or threatening violence toward people or property: Absent Explosive and/or unpredictable anger: Absent Rocking, rubbing, moaning, or other self-stimulating behavior: Absent Pulling at tubes, restraints, etc.: Absent Wandering from treatment areas: Absent Restlessness, pacing, excessive movement: Absent Repetitive behaviors, motor, and/or verbal: Absent Rapid, loud, or excessive talking: Present to a slight degree Sudden changes of mood: Absent Easily initiated or excessive crying and/or laughter: Absent Self-abusiveness, physical and/or verbal: Absent Agitated behavior scale total score: 16  Therapy/Group: Individual Therapy  [name] 04/12/2021, 12:25 PM

## 2021-04-12 NOTE — Progress Notes (Signed)
Occupational Therapy TBI Note  Patient Details  Name: [name] MRN: [id number] Date of Birth: 1940-12-11  Today's Date: 04/12/2021 OT Individual Time: 0700-0800 OT Individual Time Calculation (min): 60 min    Short Term Goals: Week 1:  OT Short Term Goal 1 (Week 1): Pt will thread BLE into pants OT Short Term Goal 2 (Week 1): Pt will sequence bathing body parts with no more than min cuing OT Short Term Goal 3 (Week 1): Pt will groom wiht no VC OT Short Term Goal 4 (Week 1): Pt will trnasfer to toilet/BSC with MOD A  Skilled Therapeutic Interventions/Progress Updates:     Pt received in EOB eating breakfast with no pain reported during session. BP after ADL 102/55: teds donned  ADL: Decreased organizaiton of food tray while eating initially and  Pt completes bathing with MIN A sit to stand for buttock and peri hygeine. S for UB dressing and CGA to wash B feet. Pt completes UB dressing with set up to doff and don new shirt. Clothing placed on R for R attention.  Pt completes LB dressing with CGA at sit to stand level Pt completes footwear with A to don teds but [t able to fasten and don shoes in figure 4 VC for noticing cap already off lotion ot apply to arms. Pt then needs max VC for locating cap of lotion on R of sink. Pt completes oral care with set up. Pt completes functional mobility in room and hallway to practice RW management with 1 episode of buckling of RLE with MIN A overall with VC for wider BOS and closer proximity to RW  Pt left at end of session in bed with exit alarm on, call light in reach and all needs met   Therapy Documentation Precautions:  Precautions Precautions: Fall, Other (comment) Precaution Comments: Sinus precautions, may not blow nose, may not use straw Restrictions Weight Bearing Restrictions: No Other Position/Activity Restrictions: see above, sinus precuations General:   Vital Signs: Therapy Vitals Temp: 98.6 F (37 C) Temp Source:  Oral Pulse Rate: 74 Resp: 16 BP: (!) 115/57 Patient Position (if appropriate): Lying Oxygen Therapy SpO2: 95 % O2 Device: Room Air Pain: Pain Assessment Pain Scale: 0-10 Pain Score: 8  Faces Pain Scale: Hurts even more Pain Type: Acute pain Pain Location: Foot Pain Orientation: Right;Left Pain Descriptors / Indicators: Aching Pain Frequency: Constant Pain Onset: Gradual Patients Stated Pain Goal: 1 Pain Intervention(s): Medication (See eMAR) Multiple Pain Sites: No Agitated Behavior Scale: TBI  Observation Details Observation Environment: pt room Start of observation period - Date: 04/12/21 Start of observation period - Time: 0700 End of observation period - Date: 04/12/21 End of observation period - Time: 0800 Agitated Behavior Scale (DO NOT LEAVE BLANKS) Short attention span, easy distractibility, inability to concentrate: Present to a slight degree Impulsive, impatient, low tolerance for pain or frustration: Absent Uncooperative, resistant to care, demanding: Absent Violent and/or threatening violence toward people or property: Absent Explosive and/or unpredictable anger: Absent Rocking, rubbing, moaning, or other self-stimulating behavior: Absent Pulling at tubes, restraints, etc.: Absent Wandering from treatment areas: Absent Restlessness, pacing, excessive movement: Absent Repetitive behaviors, motor, and/or verbal: Absent Rapid, loud, or excessive talking: Absent Sudden changes of mood: Absent Easily initiated or excessive crying and/or laughter: Absent Self-abusiveness, physical and/or verbal: Absent Agitated behavior scale total score: 15  ADL: ADL Grooming: Supervision/safety Where Assessed-Grooming: Edge of bed Upper Body Bathing: Supervision/safety Where Assessed-Upper Body Bathing: Edge of bed Lower Body Bathing: Maximal assistance  Where Assessed-Lower Body Bathing: Edge of bed, Bed level Upper Body Dressing: Supervision/safety Lower Body Dressing:  Maximal assistance Where Assessed-Lower Body Dressing: Bed level Toileting: Unable to assess Toilet Transfer: Unable to assess Vision   Perception    Praxis   Exercises:   Other Treatments:     Therapy/Group: Individual Therapy  Tonny Branch 04/12/2021, 6:49 AM
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
## 2021-04-13 LAB — GLUCOSE, CAPILLARY
Glucose-Capillary: 173 mg/dL — ABNORMAL HIGH (ref 70–99)
Glucose-Capillary: 275 mg/dL — ABNORMAL HIGH (ref 70–99)
Glucose-Capillary: 160 mg/dL — ABNORMAL HIGH (ref 70–99)
Glucose-Capillary: 205 mg/dL — ABNORMAL HIGH (ref 70–99)

## 2021-04-13 MED ORDER — QUETIAPINE FUMARATE 25 MG PO TABS
25.0000 mg | ORAL_TABLET | Freq: Every day | ORAL | Status: DC
  Administered 2021-04-14 – 2021-04-18 (×5): 25 mg via ORAL
  Filled 2021-04-13 (×5): qty 1

## 2021-04-13 MED ORDER — SORBITOL 70 % SOLN
60.0000 mL | Status: AC
  Administered 2021-04-13: 60 mL via ORAL
  Filled 2021-04-13: qty 60

## 2021-04-13 NOTE — Progress Notes (Addendum)
Physical Therapy Session Note  Patient Details  Name: [name] MRN: [id number] Date of Birth: January 20, 1941  Today's Date: 04/13/2021 PT Individual Time: 1401-1500 PT Individual Time Calculation (min): 59 min   Short Term Goals: Week 1:  PT Short Term Goal 1 (Week 1): Pt will perform sit to stand to RW w/min assist PT Short Term Goal 2 (Week 1): Pt will maintain static stand w/RW w/min assist PT Short Term Goal 3 (Week 1): Pt will ambulate 73ft w/RW w/min assist of 1, wc follow for safety if needed PT Short Term Goal 4 (Week 1): pt will tolerate assessment of car transfer  Skilled Therapeutic Interventions/Progress Updates: Pt presents supine in bed asleep, but arouses and agreeable to therapy.  Pt transfers sup to sit w/ supervision using siderails.  Pt scoots to EOB.  Pt states having diarrhea today and requests not getting too far from BR.  Pt transfers sit to stand from elevated bed w/ min A and verbal cues for hand placement.  Pt amb 4' to w/c.  Pt performed seated there ex to increase strength.  Pt performed 3 x 10 LAQ, hip flexion w/ rest breaks between trials.  Pt performed multiple sit to stand trials w/ cues required especially w/ stand to sit for safety.  Pt performed standing marching, and 1 UE support and attempt for unassisted standing, but unable.  Pt amb multiple trials into hallway approx. 100' including turns to return to w/c.  Pt amb w/ flexed posture and decreaed BOS and placement 2/2 neuropathy.  Pt returned to supine in bed w/ supervision.  Bed alarm on and all needs in reach.     Therapy Documentation Precautions:  Precautions Precautions: Fall, Other (comment) Precaution Comments: Sinus precautions, may not blow nose, may not use straw Restrictions Weight Bearing Restrictions: No Other Position/Activity Restrictions: see above, sinus precuations General:   Vital Signs: Therapy Vitals Temp: (!) 97.5 F (36.4 C) Pulse Rate: 67 Resp: 16 BP: (!) 95/56 Patient  Position (if appropriate): Lying Oxygen Therapy SpO2: 96 % O2 Device: Room Air Pain: pt c/o HA but does not request pain meds. Pain Assessment Pain Scale: 0-10 Pain Score: 3  Pain Type: Acute pain Pain Location: Head Pain Orientation: Left Pain Descriptors / Indicators: Headache Pain Frequency: Intermittent Pain Intervention(s): Medication (See eMAR) Mobility:      Therapy/Group: Individual Therapy  [name] 04/13/2021, 3:55 PM

## 2021-04-13 NOTE — Progress Notes (Signed)
Physical Therapy Session Note  Patient Details  Name: [name] MRN: [id number] Date of Birth: [birth year]  Today's Date: 04/13/2021 PT Individual Time: 2707-8675 PT Individual Time Calculation (min): 25 min   Short Term Goals: Week 1:  PT Short Term Goal 1 (Week 1): Pt will perform sit to stand to RW w/min assist PT Short Term Goal 2 (Week 1): Pt will maintain static stand w/RW w/min assist PT Short Term Goal 3 (Week 1): Pt will ambulate 26ft w/RW w/min assist of 1, wc follow for safety if needed PT Short Term Goal 4 (Week 1): pt will tolerate assessment of car transfer  Skilled Therapeutic Interventions/Progress Updates:   Pt received supine in bed and agreeable to PT with slight encouragement. Wife present at bedside. Supine>sit transfer with supervision assist and increased time. PT assisted pt to don shoes EOB with total A for time management. Ambulatory transfer to and from bed (96ft +14ft) with min assist overall from PT with cues for proper UE placement to push into standing and reach for sitting surface with descent.   Stand pivot transfer to [facility] with min assist for safety. Mild GR noted in stance on the LLE. Nustep reciprocal movement training/sustained attention training x 5 minutes. Pt able to recall stop time when asked throughout activity, but continued past 5 minutes functionally.   Pt returned to room. Sit>supine completed with supervision assist, and left supine in bed with call bell in reach and all needs met.          Therapy Documentation Precautions:  Precautions Precautions: Fall, Other (comment) Precaution Comments: Sinus precautions, may not blow nose, may not use straw Restrictions Weight Bearing Restrictions: No Other Position/Activity Restrictions: see above, sinus precuations  Vital Signs: Therapy Vitals Temp: (!) 97.5 F (36.4 C) Pulse Rate: 67 Resp: 16 BP: (!) 95/56 Patient Position (if appropriate): Lying Oxygen Therapy SpO2: 96 % O2 Device:  Room Air Pain:  denies    Therapy/Group: Individual Therapy  Lorie Phenix 04/13/2021, 1:57 PM
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
## 2021-04-13 NOTE — Progress Notes (Signed)
Occupational Therapy TBI Note  Patient Details  Name: [name] MRN: [id number] Date of Birth: [birth year]  Today's Date: 04/13/2021 OT Individual Time: 2979-8921 OT Individual Time Calculation (min): 45 min    Short Term Goals: Week 1:  OT Short Term Goal 1 (Week 1): Pt will thread BLE into pants OT Short Term Goal 2 (Week 1): Pt will sequence bathing body parts with no more than min cuing OT Short Term Goal 3 (Week 1): Pt will groom wiht no VC OT Short Term Goal 4 (Week 1): Pt will trnasfer to toilet/BSC with MOD A  Skilled Therapeutic Interventions/Progress Updates:     Pt received in bed agreeable to OT. Pt with HA unrated. Pt requires increased time to improve arousal. Decreased voice volme utilized d/t sound sensitivity increasing HA and pt received tylenol  ADL: Pt gathers clting at [employer] level with MIN A  Pt completes UB dressing with set up Pt completes LB dressing with MIN A sit to stand at sink Pt completes footwear with MIN A to don shoes and total A to don socks  Therapeutic activity Pt completes functional mobility with RW outisde with MIN A with 1 instance of buckling. Pt edu re wider even stance on standing and ambaulating instead of narrow walking stance.   Pt left at end of session in bed with exit alarm on, call light in reach and all needs met   Therapy Documentation Precautions:  Precautions Precautions: Fall, Other (comment) Precaution Comments: Sinus precautions, may not blow nose, may not use straw Restrictions Weight Bearing Restrictions: No Other Position/Activity Restrictions: see above, sinus precuations General:   Vital Signs: Therapy Vitals Pulse Rate: 71 Resp: 16 BP: (!) 109/58 Patient Position (if appropriate): Lying Oxygen Therapy SpO2: 97 % O2 Device: Room Air Pain: Pain Assessment Pain Scale: 0-10 Pain Score: 5  Pain Type: Acute pain Pain Location: Head Pain Descriptors / Indicators: Headache Pain Intervention(s):  Medication (See eMAR) Agitated Behavior Scale: TBI  Observation Details Observation Environment: pt room Start of observation period - Date: 04/13/21 Start of observation period - Time: 1030 End of observation period - Date: 04/13/21 End of observation period - Time: 1115 Agitated Behavior Scale (DO NOT LEAVE BLANKS) Short attention span, easy distractibility, inability to concentrate: Present to a slight degree Impulsive, impatient, low tolerance for pain or frustration: Absent Uncooperative, resistant to care, demanding: Absent Violent and/or threatening violence toward people or property: Absent Explosive and/or unpredictable anger: Absent Rocking, rubbing, moaning, or other self-stimulating behavior: Absent Pulling at tubes, restraints, etc.: Absent Wandering from treatment areas: Absent Restlessness, pacing, excessive movement: Absent Repetitive behaviors, motor, and/or verbal: Absent Rapid, loud, or excessive talking: Absent Sudden changes of mood: Absent Easily initiated or excessive crying and/or laughter: Absent Self-abusiveness, physical and/or verbal: Absent Agitated behavior scale total score: 15  ADL: ADL Grooming: Supervision/safety Where Assessed-Grooming: Edge of bed Upper Body Bathing: Supervision/safety Where Assessed-Upper Body Bathing: Edge of bed Lower Body Bathing: Maximal assistance Where Assessed-Lower Body Bathing: Edge of bed, Bed level Upper Body Dressing: Supervision/safety Lower Body Dressing: Maximal assistance Where Assessed-Lower Body Dressing: Bed level Toileting: Unable to assess Toilet Transfer: Unable to assess Vision   Perception    Praxis   Exercises:   Other Treatments:     Therapy/Group: Individual Therapy  Tonny Branch 04/13/2021, 8:59 AM

## 2021-04-13 NOTE — Progress Notes (Signed)
Speech Language Pathology Weekly Progress and Session Note  Patient Details  Name: [name] MRN: [id number] Date of Birth: 07/19/1941  Beginning of progress report period: April 06, 2021 End of progress report period: April 13, 2021  Today's Date: 04/13/2021 SLP Individual Time: 0845-0930 SLP Individual Time Calculation (min): 45 min  Short Term Goals: Week 1: SLP Short Term Goal 1 (Week 1): Pt will recall functional and novel information with min A cues SLP Short Term Goal 1 - Progress (Week 1): Met SLP Short Term Goal 2 (Week 1): Pt will ID 3 physical and/or cognitive changes and their implications following TBI with min A SLP Short Term Goal 2 - Progress (Week 1): Met SLP Short Term Goal 3 (Week 1): Pt will complete mildly complex problem solving tasks with min A SLP Short Term Goal 3 - Progress (Week 1): Not met SLP Short Term Goal 4 (Week 1): Pt will complete money management tasks with min A SLP Short Term Goal 4 - Progress (Week 1): Not met    New Short Term Goals: Week 2: SLP Short Term Goal 1 (Week 2): Pt will complete mildly complex problem solving tasks with min A SLP Short Term Goal 2 (Week 2): Pt will recall functional and novel information with min A cues following 10 minute delay SLP Short Term Goal 3 (Week 2): Pt will complete money management tasks with min A SLP Short Term Goal 4 (Week 2): Pt will increase selective attention by attending and re-directing to tasks in mildly distracting environment with Min A verbal cues  Weekly Progress Updates: Patient has made consistent progress toward goals, and has met 2/4 goals this reporting period. Patient continues to require Mod A verbal cues for money management, Min A verbal cues for functional recall following short-term delays (5 minutes), and Mod verbal and visual cues for mildly complex problem solving tasks. Patient is able to sustain attention for approximately 5-7 minute intervals within quiet, low stimulus  environment, and exhibits breakdown in selective attention with even mild environmental stimuli. Patient displays improvement with insight into his cognitive deficits as evidenced by ability to self reflect on challenges during cognitive tasks and connect them to his TBI. Patient will continue to benefit from skilled SLP treatment to maximize functional independence and remains motivated to improve.   Intensity: Minumum of 1-2 x/day, 30 to 90 minutes Frequency: 3 to 5 out of 7 days Duration/Length of Stay: 7/2 Treatment/Interventions: Cognitive remediation/compensation;Cueing hierarchy;Functional tasks;Therapeutic Activities;Therapeutic Exercise;Medication managment;Patient/family education   Daily Session Skilled Therapeutic Interventions: Skilled SLP treatment performed with focus on cognitive goals. Patient complained of mild headache which improved during session secondary to pain medicine that was received shortly prior to session. Suspect headache contributed to more delayed processing speed today. SLP facilitated mildly complex problem solving task with initial max verbal and visual cues progressing to moderate cues for mental flexibility and processing. Patient independently identified slower processing as cognitive change s/p TBI which impacted efficiency during today's task. Patient was left in bed with alarm activated and all needs within reach.       Pain Pain Assessment Pain Scale: 0-10 Pain Score: 0-No pain  Therapy/Group: Individual Therapy   T  04/13/2021, 2:11 PM       

## 2021-04-14 DIAGNOSIS — S066X9S Traumatic subarachnoid hemorrhage with loss of consciousness of unspecified duration, sequela: Secondary | ICD-10-CM

## 2021-04-14 LAB — GLUCOSE, CAPILLARY
Glucose-Capillary: 201 mg/dL — ABNORMAL HIGH (ref 70–99)
Glucose-Capillary: 223 mg/dL — ABNORMAL HIGH (ref 70–99)
Glucose-Capillary: 183 mg/dL — ABNORMAL HIGH (ref 70–99)
Glucose-Capillary: 185 mg/dL — ABNORMAL HIGH (ref 70–99)

## 2021-04-14 MED ORDER — METFORMIN HCL 850 MG PO TABS
850.0000 mg | ORAL_TABLET | Freq: Two times a day (BID) | ORAL | Status: DC
  Administered 2021-04-14 – 2021-04-20 (×12): 850 mg via ORAL
  Filled 2021-04-14 (×12): qty 1

## 2021-04-14 MED ORDER — NEPRO/CARBSTEADY PO LIQD
237.0000 mL | Freq: Three times a day (TID) | ORAL | Status: DC
  Administered 2021-04-14 – 2021-04-15 (×4): 237 mL via ORAL

## 2021-04-14 MED ORDER — ADULT MULTIVITAMIN W/MINERALS CH
1.0000 | ORAL_TABLET | Freq: Every day | ORAL | Status: DC
  Administered 2021-04-15 – 2021-04-28 (×14): 1 via ORAL
  Filled 2021-04-14 (×14): qty 1

## 2021-04-14 NOTE — Progress Notes (Signed)
Occupational Therapy Weekly Progress Note  Patient Details  Name: [name] MRN: [id number] Date of Birth: Mar 01, 1941  Beginning of progress report period: April 07, 2021 End of progress report period: April 14, 2021    Patient has met 3 of 3 short term goals.  Pt has made good progress this reporting period improveing overall tolerance and participation in tx greatly. Pt is MIN A for LB dressing, transfers and bathing, S for UB dressing, and MOD A for footwear. Pt continues to be limited by HA, pain and fatigue impacting participation in tx.   Patient continues to demonstrate the following deficits: muscle weakness, decreased cardiorespiratoy endurance, impaired timing and sequencing, unbalanced muscle activation, and decreased coordination, decreased attention, decreased awareness, decreased problem solving, decreased safety awareness, and decreased memory, and decreased standing balance, decreased postural control, and decreased balance strategies and therefore will continue to benefit from skilled OT intervention to enhance overall performance with BADL and iADL.  Patient progressing toward long term goals..  Continue plan of care.  OT Short Term Goals Week 1:  OT Short Term Goal 1 (Week 1): Pt will thread BLE into pants OT Short Term Goal 2 (Week 1): Pt will sequence bathing body parts with no more than min cuing OT Short Term Goal 2 - Progress (Week 1): Met OT Short Term Goal 3 (Week 1): Pt will groom wiht no VC OT Short Term Goal 3 - Progress (Week 1): Met OT Short Term Goal 4 (Week 1): Pt will trnasfer to toilet/BSC with MOD A OT Short Term Goal 4 - Progress (Week 1): Met Week 2:  OT Short Term Goal 1 (Week 2): Pt will consistently transfer to bathroom with LRAD and CGA OT Short Term Goal 2 (Week 2): Pt will groom in standingw iht CGA OT Short Term Goal 3 (Week 2): Pt will bathe sit to stand wiht CGA OT Short Term Goal 4 (Week 2): Pt wil don footwear wiht MIN A     [name]  [name] 04/14/2021, 6:51 AM
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
## 2021-04-15 LAB — GLUCOSE, CAPILLARY
Glucose-Capillary: 155 mg/dL — ABNORMAL HIGH (ref 70–99)
Glucose-Capillary: 179 mg/dL — ABNORMAL HIGH (ref 70–99)
Glucose-Capillary: 225 mg/dL — ABNORMAL HIGH (ref 70–99)
Glucose-Capillary: 167 mg/dL — ABNORMAL HIGH (ref 70–99)

## 2021-04-15 MED ORDER — TOPIRAMATE 25 MG PO TABS
25.0000 mg | ORAL_TABLET | Freq: Two times a day (BID) | ORAL | Status: DC
  Administered 2021-04-15 – 2021-04-16 (×2): 25 mg via ORAL
  Filled 2021-04-15 (×2): qty 1

## 2021-04-15 NOTE — Progress Notes (Signed)
Physical Therapy Weekly Progress Note  Patient Details  Name: [name] MRN: [id number] Date of Birth: [birth year]  Beginning of progress report period: April 07, 2021 End of progress report period: April 15, 2021  Today's Date: 04/15/2021 PT Individual Time: 1300-1415 PT Individual Time Calculation (min): 75 min   Patient has met 3 of 4 short term goals.  Patient with stedy progress this week. He currently performs all mobility with min A-CGA, currently ambulating 100-200 ft using RW and performing 4x6" steps with B rails. Some limitations during sessions due to headache and verbose and tangential language requiring mod A to redirect.   Patient continues to demonstrate the following deficits muscle weakness and muscle joint tightness, decreased cardiorespiratoy endurance, abnormal tone, decreased attention and decreased memory, and decreased standing balance, decreased postural control, and decreased balance strategies and therefore will continue to benefit from skilled PT intervention to increase functional independence with mobility.  Patient progressing toward long term goals. Continue plan of care.  PT Short Term Goals Week 1:  PT Short Term Goal 1 (Week 1): Pt will perform sit to stand to RW w/min assist PT Short Term Goal 1 - Progress (Week 1): Met PT Short Term Goal 2 (Week 1): Pt will maintain static stand w/RW w/min assist PT Short Term Goal 2 - Progress (Week 1): Met PT Short Term Goal 3 (Week 1): Pt will ambulate 29f w/RW w/min assist of 1, wc follow for safety if needed PT Short Term Goal 3 - Progress (Week 1): Met PT Short Term Goal 4 (Week 1): pt will tolerate assessment of car transfer PT Short Term Goal 4 - Progress (Week 1): Progressing toward goal Week 2:  PT Short Term Goal 1 (Week 2): Patient will perform basic transfers with CGA-supervision using LRAD. PT Short Term Goal 2 (Week 2): Patient will ambulate >150 ft with CGA using LRAD. PT Short Term Goal 3 (Week 2):  Patient will ambulate over at least 10 ft of unlevel surfaces with min A using LRAD. PT Short Term Goal 4 (Week 2): Patient will perform standing balanc e with single upper extremtiy support with supervision during a functional task >5 min.  Therapy Documentation Precautions:  Precautions Precautions: Fall, Other (comment) Precaution Comments: Sinus precautions, may not blow nose, may not use straw Restrictions Weight Bearing Restrictions: No Other Position/Activity Restrictions: see above, sinus precuations   Therapy/Group: Individual Therapy  [name] PT, DPT  04/15/2021, 4:30 PM

## 2021-04-15 NOTE — Progress Notes (Signed)
Patient has been having intermittent headaches for the past three day. Headaches are being treated with tylenol. After tylenol is giving patient states he feels a little relief. On call MD notified.  Idamae Schuller, LPN

## 2021-04-15 NOTE — Progress Notes (Signed)
Physical Therapy TBI Note  Patient Details  Name: [name] MRN: [id number] Date of Birth: [birth year]  Today's Date: 04/15/2021 PT Individual Time: 8242-3536 PT Individual Time Calculation (min): 75 min   Short Term Goals: Week 2:  PT Short Term Goal 1 (Week 2): Patient will perform basic transfers with CGA-supervision using LRAD. PT Short Term Goal 2 (Week 2): Patient will ambulate >150 ft with CGA using LRAD. PT Short Term Goal 3 (Week 2): Patient will ambulate over at least 10 ft of unlevel surfaces with min A using LRAD. PT Short Term Goal 4 (Week 2): Patient will perform standing balanc e with single upper extremtiy support with supervision during a functional task >5 min.  Skilled Therapeutic Interventions/Progress Updates:     Patient in bed upon PT arrival. Patient reported 5/10 headache pain during session, RN made aware. PT provided repositioning, rest breaks, and distraction as pain interventions throughout session. Per patient's wife, patient with significant increased in headaches and "vertigo" x2 over the past two days and discussed a repeat CT with RN this morning, RN confirmed and to follow-up with MD. Focused session on assessment of functional mobility and symptom provocation during session.  Patient initially reluctant to get OOB due to "feeling off" however became agreeable when educated on importance of mobility and need for assessment for change in function due to symptoms reported over the past 2 days.   Patient requested a cup of coffee at beginning of session. Patient was able to provide instructions for making a cup of coffee to his liking. PT provided coffee for patient during session as instructed. Patient did spill the coffee due to increased tremor and reduced dexterity in his hands. Patient remains verbose with some tangential language this session. Able to redirect with mod-min A throughout session.   Therapeutic Activity: Bed Mobility: Patient performed  supine to/from sit with supervision-mod I in a flat bed without use of bed rails. Patient adjusted his non-skid socks with min cues for safety with mobility sitting EOB with supervision.  Transfers: Patient performed sit to/from stand x2 with CGA using RW with increased time while boosting up due to lower extremity weakness. Provided min verbal cues for reaching back to sit for safety with controlled descent x2, patient able to teach back hand placement for standing, and self correct to recall forward weight shift with transfers. Patient performed blocked practice sit to stands x5 without AD to focus on forward weight shift with CGA-min A for balance.   Gait Training:  Patient ambulated >100 feet x2 using RW with CGA. Ambulated with R lateral knee thrust, flexed posture, downward head gaze, decreased B DF, decreased gastroc activation in push-of, and decreased step height, step length, and gait speed. Provided verbal cues for erect posture, visual scanning of environment, proximity to RW, and increased propulsion and gait speed.  Patient reported "vertigo" described as the "room spinning/moving" following first tiral. Symptoms subsided with cues to focus on a fixed object. Vitals: BP 113/67, HR 71, SPO2 96%. Patient denied symptoms after second trial.  Patient and his wife with several questions regarding the patient's facial fractures and SDH. Reviewed imaging with patient's wife, as patient did not want to see images. Provided education to both about location and severity of injuries. Educated on bone healing, precautions, signs/symptoms of concern related to facial fractures. Discussed symptoms related to SDH, including headache and dizziness. Educated on no signs of functional change or neurological signs/symptoms indicating concern for neurological changes at this time.  Informed RN and agreed to monitor patient's symptoms closing over the next few days, MD made aware.   Patient in bed with is wife at  bedside at end of session with breaks locked, bed alarm set, and all needs within reach.   Therapy Documentation Precautions:  Precautions Precautions: Fall, Other (comment) Precaution Comments: Sinus precautions, may not blow nose, may not use straw Restrictions Weight Bearing Restrictions: No Other Position/Activity Restrictions: see above, sinus precuations  Agitated Behavior Scale: TBI Observation Details Observation Environment: [address] Start of observation period - Date: 04/15/21 Start of observation period - Time: 1300 End of observation period - Date: 04/15/21 End of observation period - Time: 1415 Agitated Behavior Scale (DO NOT LEAVE BLANKS) Short attention span, easy distractibility, inability to concentrate: Present to a slight degree Impulsive, impatient, low tolerance for pain or frustration: Absent Uncooperative, resistant to care, demanding: Absent Violent and/or threatening violence toward people or property: Absent Explosive and/or unpredictable anger: Absent Rocking, rubbing, moaning, or other self-stimulating behavior: Absent Pulling at tubes, restraints, etc.: Absent Wandering from treatment areas: Absent Restlessness, pacing, excessive movement: Absent Repetitive behaviors, motor, and/or verbal: Absent Rapid, loud, or excessive talking: Present to a slight degree Sudden changes of mood: Absent Easily initiated or excessive crying and/or laughter: Absent Self-abusiveness, physical and/or verbal: Absent Agitated behavior scale total score: 16   Therapy/Group: Individual Therapy  Elmyra Banwart L Ruhee Enck PT, DPT  04/15/2021, 4:33 PM

## 2021-04-16 ENCOUNTER — Inpatient Hospital Stay (HOSPITAL_COMMUNITY): Payer: Medicare Other

## 2021-04-16 LAB — CBC
HCT: 33.9 % — ABNORMAL LOW (ref 39.0–52.0)
Hemoglobin: 11.3 g/dL — ABNORMAL LOW (ref 13.0–17.0)
MCH: 32.4 pg (ref 26.0–34.0)
MCHC: 33.3 g/dL (ref 30.0–36.0)
MCV: 97.1 fL (ref 80.0–100.0)
Platelets: 264 10*3/uL (ref 150–400)
RBC: 3.49 MIL/uL — ABNORMAL LOW (ref 4.22–5.81)
RDW: 15.7 % — ABNORMAL HIGH (ref 11.5–15.5)
WBC: 6.4 10*3/uL (ref 4.0–10.5)
nRBC: 0 % (ref 0.0–0.2)

## 2021-04-16 LAB — GLUCOSE, CAPILLARY
Glucose-Capillary: 129 mg/dL — ABNORMAL HIGH (ref 70–99)
Glucose-Capillary: 141 mg/dL — ABNORMAL HIGH (ref 70–99)
Glucose-Capillary: 153 mg/dL — ABNORMAL HIGH (ref 70–99)
Glucose-Capillary: 103 mg/dL — ABNORMAL HIGH (ref 70–99)

## 2021-04-16 LAB — BASIC METABOLIC PANEL
Anion gap: 9 (ref 5–15)
BUN: 26 mg/dL — ABNORMAL HIGH (ref 8–23)
CO2: 31 mmol/L (ref 22–32)
Calcium: 10.3 mg/dL (ref 8.9–10.3)
Chloride: 97 mmol/L — ABNORMAL LOW (ref 98–111)
Creatinine, Ser: 1.2 mg/dL (ref 0.61–1.24)
GFR, Estimated: >60 mL/min (ref >60)
Glucose, Bld: 126 mg/dL — ABNORMAL HIGH (ref 70–99)
Potassium: 4.5 mmol/L (ref 3.5–5.1)
Sodium: 137 mmol/L (ref 135–145)

## 2021-04-16 MED ORDER — TOPIRAMATE 25 MG PO TABS
50.0000 mg | ORAL_TABLET | Freq: Two times a day (BID) | ORAL | Status: DC
  Administered 2021-04-16 – 2021-04-19 (×6): 50 mg via ORAL
  Filled 2021-04-16 (×6): qty 2

## 2021-04-16 MED ORDER — METOPROLOL TARTRATE 25 MG PO TABS
25.0000 mg | ORAL_TABLET | Freq: Two times a day (BID) | ORAL | Status: DC
  Administered 2021-04-16 – 2021-04-20 (×8): 25 mg via ORAL
  Filled 2021-04-16 (×8): qty 1

## 2021-04-16 MED ORDER — MIRTAZAPINE 15 MG PO TABS
15.0000 mg | ORAL_TABLET | Freq: Every day | ORAL | Status: DC
  Administered 2021-04-16 – 2021-04-27 (×12): 15 mg via ORAL
  Filled 2021-04-16 (×12): qty 1
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
## 2021-04-16 NOTE — Progress Notes (Signed)
Physical Therapy TBI Note  Patient Details  Name: [name] MRN: [id number] Date of Birth: [birth year]  Today's Date: 04/16/2021 PT Individual Time: 1315-1400 PT Individual Time Calculation (min): 45 min   Short Term Goals: Week 2:  PT Short Term Goal 1 (Week 2): Patient will perform basic transfers with CGA-supervision using LRAD. PT Short Term Goal 2 (Week 2): Patient will ambulate >150 ft with CGA using LRAD. PT Short Term Goal 3 (Week 2): Patient will ambulate over at least 10 ft of unlevel surfaces with min A using LRAD. PT Short Term Goal 4 (Week 2): Patient will perform standing balanc e with single upper extremtiy support with supervision during a functional task >5 min.  Skilled Therapeutic Interventions/Progress Updates:     Patient in bed with his wife in the room upon PT arrival. Patient alert and agreeable to PT session. Patient reported 2-3/10 headache during session, RN made aware. PT provided repositioning, rest breaks, and distraction as pain interventions throughout session. Patient's wife with several questions regarding patient's headaches and increased frequency of "vertigo." Provided education on TBI recovery. Patient's wife asking to speak to the MD. RN made aware and MD arrived for rounding shortly after. Patient missed 15 min of session to allow MD to discuss symptoms and management with patient and his wife.   Planned to perform vestibular evaluation during session due to reports of vertigo/dizziness. MD ordered head CT, will hold evaluation pending CT results and reassess need for vestibular assessment tomorrow. Focused on gait training and activity tolerance during remainder of session.   Provided education on vestibular assessment and central versus peripheral symptoms and causes related to TBI and head/facial trauma.   Orthostatic Vitals: Supine: BP 115/63, HR 72  Sitting: BP 90/58, HR 72 Standing: BP 104/59, HR 85 Standing x3 min: BP 117/68, HR  87  Therapeutic Activity: Bed Mobility: Patient performed supine to/from sit with supervision with use of bed rail. Provided verbal cues for reducing use of bed rail to simulate home set-up. Patient doffed/donned non-skid socks sitting EOB with set-up assist and donned/doffed tennis shoes with max A for energy/time management.  Transfers: Patient performed sit to/from stand x3 with min A-CGA using RW. Provided verbal cues for hand placement x1, reaching back to sit x2, and forward weight shift x3.  Gait Training:  Patient ambulated ~130 feet x2 using RW with CGA-min A. Cued patient to perform side-stepping R/L x10 ft with RW during second trial. Ambulated with R lateral knee thrust, flexed posture, downward head gaze, decreased B DF, decreased gastroc activation in push-of, and B trendelenburg gait. Provided verbal cues for erect posture, visual scanning of environment, proximity to RW, and increased propulsion and gait speed with facilitation of forward progression of RW.  Patient denied any symptoms of dizziness throughout session.   Patient in bed with his wife at bedside at end of session with breaks locked, bed alarm set, and all needs within reach.   Therapy Documentation Precautions:  Precautions Precautions: Fall, Other (comment) Precaution Comments: Sinus precautions, may not blow nose, may not use straw Restrictions Weight Bearing Restrictions: No Other Position/Activity Restrictions: see above, sinus precuations General: PT Amount of Missed Time (min): 15 Minutes PT Missed Treatment Reason: Other (Comment) (MD rounding and providing patient/caregiver edu)  Agitated Behavior Scale: TBI Observation Details Observation Environment: [place] Start of observation period - Date: 04/16/21 Start of observation period - Time: 1315 End of observation period - Date: 04/16/21 End of observation period - Time: 1400  Agitated Behavior Scale (DO NOT LEAVE BLANKS) Short attention span, easy  distractibility, inability to concentrate: Present to a slight degree Impulsive, impatient, low tolerance for pain or frustration: Absent Uncooperative, resistant to care, demanding: Absent Violent and/or threatening violence toward people or property: Absent Explosive and/or unpredictable anger: Absent Rocking, rubbing, moaning, or other self-stimulating behavior: Absent Pulling at tubes, restraints, etc.: Absent Wandering from treatment areas: Absent Restlessness, pacing, excessive movement: Absent Repetitive behaviors, motor, and/or verbal: Absent Rapid, loud, or excessive talking: Absent Sudden changes of mood: Absent Easily initiated or excessive crying and/or laughter: Absent Self-abusiveness, physical and/or verbal: Absent Agitated behavior scale total score: 15   Therapy/Group: Individual Therapy  Jaymian Bogart L Evonda Enge PT, DPT  04/16/2021, 4:04 PM

## 2021-04-16 NOTE — Progress Notes (Signed)
Speech Language Pathology Daily Session Note  Patient Details  Name: IBRAHEM VOLKMAN MRN: [id number] Date of Birth: 08-Jan-1941  Today's Date: 04/16/2021 SLP Individual Time: 9030-0923 SLP Individual Time Calculation (min): 57 min  Short Term Goals: Week 2: SLP Short Term Goal 1 (Week 2): Pt will complete mildly complex problem solving tasks with min A SLP Short Term Goal 2 (Week 2): Pt will recall functional and novel information with min A cues following 10 minute delay SLP Short Term Goal 3 (Week 2): Pt will complete money management tasks with min A SLP Short Term Goal 4 (Week 2): Pt will increase selective attention by attending and re-directing to tasks in mildly distracting environment with Min A verbal cues  Skilled Therapeutic Interventions: Pt seen for skilled ST with focus on cognitive goals, wife present throughout. Pt asleep upon entry but woken easily, maintained adequate alertness and participation for all tasks. Pt completing simple calendar task, initially requiring min A for accuracy eventually decreased to supervision A. Problem solving task for home environment completed with min A verbal cues to recall current physical and cognitive impairments. Pt with intermittent mild agitation during task but easily redirected via education. Pt left in bed with alarm set and all needs within reach. Cont ST POC.   Pain Pain Assessment Pain Scale: 0-10 Pain Score: 0-No pain  Therapy/Group: Individual Therapy  Dewaine Conger 04/16/2021, 4:03 PM

## 2021-04-16 NOTE — Progress Notes (Signed)
Occupational Therapy TBI Note  Patient Details  Name: [name] MRN: [id number] Date of Birth: [birth year]  Today's Date: 04/16/2021 OT Individual Time: 3244-0102 OT Individual Time Calculation (min): 70 min   Session 2: OT Individual Time: 1030-1100 OT Individual Time Calculation (min): 30 min    Short Term Goals:  Week 2:  OT Short Term Goal 1 (Week 2): Pt will consistently transfer to bathroom with LRAD and CGA OT Short Term Goal 2 (Week 2): Pt will groom in standingw iht CGA OT Short Term Goal 3 (Week 2): Pt will bathe sit to stand wiht CGA OT Short Term Goal 4 (Week 2): Pt wil don footwear wiht MIN A  Skilled Therapeutic Interventions/Progress Updates:   Pt supine with c.o 4/10 headache, requesting OOB mobility for intervention. Pt completed bed mobility to EOB with supervision. He declined shower d/t concerns with modesty despite OT encouragement and offers to keep pt covered. Pt completed UB dressing and bathing with set up assist. He completed sit > stand from EOB with min A. He required min cueing for sequencing LB dressing method sit <> stand. Pt was able to use figure 4 method to thread pants with supervision and then required min A to pull up in standing. He ambulated 10 ft to the sink and completed grooming tasks in standing for ~2 minutes before requiring a seated rest break. CGA for standing balance when leaning anteriorly on the sink. He was taken via w/c to the therapy gym. He completed standing level functional activity focused on addressing dynamic standing balance and functional reaching posteriorly to simulate LB dressing challenges. Min A required when loosening grasp on the RW. Pt began c/o dizziness and the following orthostatic vitals were obtained. Pt finished standing level dynamic reaching task with min A for standing balance before transferring back to the w/c. Pt returned to his room and was left supine with all needs met.   BP in sidelying: 106/47.  BP sitting:  95/56 BP standing: 94/59   Session 2: Pt received supine, c/o pain in his L toes, described as neuropathy pain. He requested assist with applying icy-hot lidocaine cream- confirmed with RN and he was able to apply this himself in figure 4 technique while supine. Pt required min A to transfer to EOB. He completed stand pivot transfer with the RW with min A to transfer to w/c. He continues to require cueing for hand placement during transfers with RW. He was taken via w/c to the therapy gym where he then transferred to [facility]. Pt completed 2x20 standing level reciprocal stepping activity to address dynamic standing balance and functional activity tolerance. He required prolonged rest break following. He ended with BUE strengthening circuit using a 1 kg medicine ball- reaching overhead and out with intermittent cueing for technique. Pt returned to the room and was left sitting up in the recliner with all needs met, chair alarm set.     Therapy Documentation Precautions:  Precautions Precautions: Fall, Other (comment) Precaution Comments: Sinus precautions, may not blow nose, may not use straw Restrictions Weight Bearing Restrictions: No Other Position/Activity Restrictions: see above, sinus precuations  Agitated Behavior Scale: TBI Observation Details Observation Environment: [place] of observation period - Date: 04/16/21 Start of observation period - Time: 0730 End of observation period - Date: 04/16/21 End of observation period - Time: 0845 Agitated Behavior Scale (DO NOT LEAVE BLANKS) Short attention span, easy distractibility, inability to concentrate: Present to a slight degree Impulsive, impatient, low tolerance for  pain or frustration: Absent Uncooperative, resistant to care, demanding: Absent Violent and/or threatening violence toward people or property: Absent Explosive and/or unpredictable anger: Absent Rocking, rubbing, moaning, or other self-stimulating behavior:  Absent Pulling at tubes, restraints, etc.: Absent Wandering from treatment areas: Absent Restlessness, pacing, excessive movement: Absent Repetitive behaviors, motor, and/or verbal: Absent Rapid, loud, or excessive talking: Absent Sudden changes of mood: Absent Easily initiated or excessive crying and/or laughter: Absent Self-abusiveness, physical and/or verbal: Absent Agitated behavior scale total score: 15    Therapy/Group: Individual Therapy  [name] 04/16/2021, 12:31 PM

## 2021-04-17 LAB — GLUCOSE, CAPILLARY
Glucose-Capillary: 103 mg/dL — ABNORMAL HIGH (ref 70–99)
Glucose-Capillary: 154 mg/dL — ABNORMAL HIGH (ref 70–99)
Glucose-Capillary: 137 mg/dL — ABNORMAL HIGH (ref 70–99)
Glucose-Capillary: 139 mg/dL — ABNORMAL HIGH (ref 70–99)

## 2021-04-17 MED ORDER — MAGNESIUM GLUCONATE 500 MG PO TABS
500.0000 mg | ORAL_TABLET | Freq: Every day | ORAL | Status: DC
  Administered 2021-04-17 – 2021-04-23 (×7): 500 mg via ORAL
  Filled 2021-04-17 (×8): qty 1

## 2021-04-17 NOTE — Progress Notes (Signed)
Occupational Therapy TBI Note  Patient Details  Name: [name] MRN: [id number] Date of Birth: 1941-10-20  Today's Date: 04/17/2021 OT Individual Time: 8088-1103 OT Individual Time Calculation (min): 48 min    Session 2: OT Individual Time: 1130-1200 OT Individual Time Calculation (min): 30 min   Short Term Goals: Week 2:  OT Short Term Goal 1 (Week 2): Pt will consistently transfer to bathroom with LRAD and CGA OT Short Term Goal 2 (Week 2): Pt will groom in standingw iht CGA OT Short Term Goal 3 (Week 2): Pt will bathe sit to stand wiht CGA OT Short Term Goal 4 (Week 2): Pt wil don footwear wiht MIN A  Skilled Therapeutic Interventions/Progress Updates:    Pt received sleeping soundly, slow to arouse this morning reporting he did no sleep well. He had no c/o pain. He was agreeable to OT session but required extra time and cueing for initiation and to increase arousal. Pt completed bed mobility with Min A to EOB. He completed UB bathing and dressing with set up assist, min cueing for initiation and thoroughness. He stood with min A, cueing still required for hand placement. He was able to doff pants sit <> stand and don new ones with min A. He required max A to don socks and shoes d/t fatigue. Pt frequently nodding off EOB when OT not talking. He completed ambulatory transfer to the sink, requesting to sit down for oral care. Pt did endorse some dizziness EOB after stand- BP below. Pt completed oral care seated with set up assist only. Pt was taken to the therapy gym via w/c. He was agreeable to complete NuStep, stating he was too tired to complete more complex task. 5 min on the NuStep with BUE and BLE active on level 5 resistance to challenge functional activity tolerance and global strengthening. Played pt's music of choice to also increase arousal/attention. Pt returned to his room following and requested to return to supine. He was left with all needs met, already sleeping. 12 min missed  d/t fatigue.   BP EOB: 102/59   Session 2:  Pt received supine, transferring to EOB with SLP supervising d/t concerns of him getting OOB alone. He completed sit > stand transfer with min A for posterior bias. He pivoted to the w/c with CGA with the RW. Pt was taken to the therapy gym. Pt stood at the [facility] with CGA using the RW while he completed sequencing task. He had increased difficulty with visual scanning/tracking on the rotating screen, requiring mod cueing. His reaction time was 8.96 seconds. Pt frustrated with performance and OT discussed impact on ADLs, functional mobility, and driving. Pt still demonstrates impaired awareness into deficits. Pt returned to his room and was encouraged to remain OOB in the recliner. He was left sitting up with all needs met, chair alarm set.    Therapy Documentation Precautions:  Precautions Precautions: Fall, Other (comment) Precaution Comments: Sinus precautions, may not blow nose, may not use straw Restrictions Weight Bearing Restrictions: No Other Position/Activity Restrictions: see above, sinus precuations    Agitated Behavior Scale: TBI Observation Details Observation Environment: [place] Start of observation period - Date: 04/17/21 Start of observation period - Time: 0840 End of observation period - Date: 04/17/21 End of observation period - Time: 0930 Agitated Behavior Scale (DO NOT LEAVE BLANKS) Short attention span, easy distractibility, inability to concentrate: Present to a slight degree Impulsive, impatient, low tolerance for pain or frustration: Absent Uncooperative, resistant to care, demanding: Absent  Violent and/or threatening violence toward people or property: Absent Explosive and/or unpredictable anger: Absent Rocking, rubbing, moaning, or other self-stimulating behavior: Absent Pulling at tubes, restraints, etc.: Absent Wandering from treatment areas: Absent Restlessness, pacing, excessive movement: Absent Repetitive  behaviors, motor, and/or verbal: Absent Rapid, loud, or excessive talking: Absent Sudden changes of mood: Absent Easily initiated or excessive crying and/or laughter: Absent Self-abusiveness, physical and/or verbal: Absent Agitated behavior scale total score: 15    Therapy/Group: Individual Therapy  [name] 04/17/2021, 9:09 AM
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
## 2021-04-17 NOTE — Progress Notes (Signed)
Speech Language Pathology TBI Note  Patient Details  Name: [name] MRN: [id number] Date of Birth: February 11, 1941  Today's Date: 04/17/2021 SLP Individual Time: 1300-1400 SLP Individual Time Calculation (min): 60 min  Short Term Goals: Week 2: SLP Short Term Goal 1 (Week 2): Pt will complete mildly complex problem solving tasks with min A SLP Short Term Goal 2 (Week 2): Pt will recall functional and novel information with min A cues following 10 minute delay SLP Short Term Goal 3 (Week 2): Pt will complete money management tasks with min A SLP Short Term Goal 4 (Week 2): Pt will increase selective attention by attending and re-directing to tasks in mildly distracting environment with Min A verbal cues  Skilled Therapeutic Interventions: Skilled ST treatment performed with focus on cognitive goals. SLP facilitated calendar organization task involving anticipatory planning/organization, information processing, selective and alternating attention, and cognitive flexibility. Patient completed task with min A verbal and visual cues. After ~15 minutes, patient exhibited decreased attention which impacted his thought processing and planning making task more challenging to complete. Patient became frustrated and demanded to conclude activity, as he stated the didn't need to participate in something so easy despite evident difficulties. Patient eventually agreeable to continue following explanation on clinical reasoning for task and verbal encouragement and apologized for his "uncalled for behavior." Patient was left in his bed with alarm activated and needs within reach. Continue with ST POC.  Pain Pain Assessment Pain Scale: 0-10 Pain Score: 0-No pain  Agitated Behavior Scale: TBI   Agitated Behavior Scale (DO NOT LEAVE BLANKS) Short attention span, easy distractibility, inability to concentrate: Present to a slight degree Impulsive, impatient, low tolerance for pain or frustration: Present to a  slight degree Uncooperative, resistant to care, demanding: Absent Violent and/or threatening violence toward people or property: Absent Explosive and/or unpredictable anger: Absent Rocking, rubbing, moaning, or other self-stimulating behavior: Absent Pulling at tubes, restraints, etc.: Absent Wandering from treatment areas: Absent Restlessness, pacing, excessive movement: Absent Repetitive behaviors, motor, and/or verbal: Absent Rapid, loud, or excessive talking: Absent Sudden changes of mood: Absent Easily initiated or excessive crying and/or laughter: Absent Self-abusiveness, physical and/or verbal: Absent Agitated behavior scale total score: 16  Therapy/Group: Individual Therapy  [name] 04/17/2021, 3:47 PM

## 2021-04-17 NOTE — Progress Notes (Signed)
Patient ID: [name], male   DOB: [birth year], 80 y.o.   MRN: [id number]  SW met with pt and pt wife in room to provide updates from team conference, and d/c date remains 7/2. Family education scheduled for Wed 6/29 10am-12pm/1pm-3pm. Wife prefers outpatient therapy.  Loralee Pacas, MSW, [place] Office: [phone] Cell: [phone] Fax: [phone]

## 2021-04-17 NOTE — Patient Care Conference (Signed)
Inpatient RehabilitationTeam Conference and Plan of Care Update Date: 04/17/2021   Time: 10:17 AM    Patient Name: [name]      Medical Record Number: [id number]  Date of Birth: 07/24/1941 Sex: Male         Room/Bed: 4W14C/4W14C-01 Payor Info: Payor: [employer] park [employer] MEDICARE / Plan: [facility] MEDICARE / Product Type: *No Product type* /    Admit Date/Time:  04/06/2021  2:33 PM  Primary Diagnosis:  Traumatic subdural hematoma [facility])  Hospital Problems: Principal Problem:   Traumatic subdural hematoma [facility])    Expected Discharge Date: Expected Discharge Date: 04/28/21  Team Members Present: Physician leading conference: Dr. Leeroy Cha Care Coodinator Present: Loralee Pacas, LCSWA;Estle Sabella Creig Hines, RN, BSN, [place] Nurse Present: Dorthula Nettles, RN PT Present: Apolinar Junes, PT OT Present: Laverle Hobby, OT SLP Present: Other (comment) Charna Elizabeth, SLP) [place] Coordinator present : Gunnar Fusi, SLP     Current Status/Progress Goal Weekly Team Focus  Bowel/Bladder   Pt continent of B/B. LBM 04/14/2021  Regular BMs every 3 days or less  Assess B/B every shift and PRN   Swallow/Nutrition/ Hydration             ADL's   Supervision UB ADLs, min A LB ADLs, min A transfers, slow to initiate  supervision overall  ADL retraining, transfers, cognitive retraining, awareness, d/c planning   Mobility   CGA-min A overall, gait 100-200 ft with RW, increased headache and dizziness over last few days limiting activity tolerance  Supervision overall  Activity tolerance, balance, strengthening, attention, functional mobility, gait and stair training, vestibular assessment/treatement if appropriate, patient/caregiver education   Communication             Safety/Cognition/ Behavioral Observations  Min A  Supervision  Complex problem solving, recall, and awareness of deficits/errors - improving   Pain   Pain to head and left foot.  Pain scale of <3/10  Assess pain every shift and  PRN   Skin   echymosis to abdomen  No new breakdown  Asess skin every shift and PRN     Discharge Planning:  Pt to d/c to home with his wife who will provide 24/7 care.   Team Discussion: Continued headache due to blood resolving. Adjusting medications. Continent B/B, neuropathic pain to bilateral feet, not sleeping well and MD aware. Ecchymosis to left side of face.   Patient on target to meet rehab goals: yes, supervision upper body ADL's, min assist lower body AD's. Has been slower to wake up this week. Supervision goals. Good progress, contact guard transfers, 100-200 ft contact guard with RW. Min assist  for complex problem solving, recall, and has supervision goals.  *See Care Plan and progress notes for long and short-term goals.   Revisions to Treatment Plan:  MD making medication adjustments.  Teaching Needs: Family education, medication management, pain management, skin/wound care, transfer training, gait training, balance training, endurance training, weight bearing precautions, safety awareness.  Current Barriers to Discharge: Decreased caregiver support, Medical stability, Home enviroment access/layout, Lack of/limited family support, Weight bearing restrictions, Medication compliance, and Behavior  Possible Resolutions to Barriers: Continue current medications, provide emotional support.     Medical Summary Current Status: facial ecchymosis and fractures, dizziness, decreased appetite, headache, diabetic peripheral neuropathy, poor mood  Barriers to Discharge: Medical stability;Wound care  Barriers to Discharge Comments: facial ecchymosis and fractures, dizziness, decreased appetite, headache, diabetic peripheral neuropathy, poor mood Possible Resolutions to [employer] Focus: continue to monitor wounds, provide education regarding foods that  are safe for him eat, amitriptyline stopped, mirtazepine started, continue lyrica, increase topamax   Continued Need for  Acute Rehabilitation Level of Care: The patient requires daily medical management by a physician with specialized training in physical medicine and rehabilitation for the following reasons: Direction of a multidisciplinary physical rehabilitation program to maximize functional independence : Yes Medical management of patient stability for increased activity during participation in an intensive rehabilitation regime.: Yes Analysis of laboratory values and/or radiology reports with any subsequent need for medication adjustment and/or medical intervention. : Yes   I attest that I was present, lead the team conference, and concur with the assessment and plan of the team.   Cristi Loron 04/17/2021, 3:46 PM

## 2021-04-17 NOTE — Progress Notes (Signed)
Physical Therapy Note  Patient Details  Name: [name] MRN: [id number] Date of Birth: May 11, 1941 Today's Date: 04/17/2021    Patient in bed asleep upon PT arrival. Patient aroused to verbal stimulation, reports poor sleep quality and B 7/10 foot pain, per conference patient did not sleep well last night due to B foot pain. Patient having difficulty staying awake during discussion and requested to continue sleeping due to fatigue. Patient missed 60 min of skilled PT due to fatigue/pain, RN made aware. Will attempt to make-up missed time as able.     Kynleigh Artz L Jolynda Townley PT, DPT   04/17/2021, 10:49 AM

## 2021-04-18 LAB — GLUCOSE, CAPILLARY
Glucose-Capillary: 128 mg/dL — ABNORMAL HIGH (ref 70–99)
Glucose-Capillary: 151 mg/dL — ABNORMAL HIGH (ref 70–99)
Glucose-Capillary: 112 mg/dL — ABNORMAL HIGH (ref 70–99)
Glucose-Capillary: 148 mg/dL — ABNORMAL HIGH (ref 70–99)

## 2021-04-18 MED ORDER — AMITRIPTYLINE HCL 25 MG PO TABS
25.0000 mg | ORAL_TABLET | Freq: Every day | ORAL | Status: DC
  Administered 2021-04-18 – 2021-04-21 (×4): 25 mg via ORAL
  Filled 2021-04-18 (×4): qty 1

## 2021-04-18 NOTE — Progress Notes (Signed)
Patient ID: [name], male   DOB: 01-May-1941, 80 y.o.   MRN: [id number]  SW met with pt and pt wife to provide HHA list ([email or web address]). Continues to prefer outpatient. SW to send outpatient referral to [facility] ([phone]/[phone]).   *SW faxed outpatient PT/OT/SLP referral to [facility] ([phone]).   SW order RW an transport w/c With [employer] via parachute (wife request for transport chair as it is lighter). Wife will also see if there is RW at home, and will f/u with [place], MSW, [place] Office: [phone] Cell: [phone] Fax: [phone]

## 2021-04-18 NOTE — Progress Notes (Signed)
Physical Therapy Session Note  Patient Details  Name: [name] MRN: [id number] Date of Birth: 27-Nov-1940  Today's Date: 04/18/2021 PT Individual Time: 4098-1191 PT Individual Time Calculation (min): 58 min   Short Term Goals: Week 2:  PT Short Term Goal 1 (Week 2): Patient will perform basic transfers with CGA-supervision using LRAD. PT Short Term Goal 2 (Week 2): Patient will ambulate >150 ft with CGA using LRAD. PT Short Term Goal 3 (Week 2): Patient will ambulate over at least 10 ft of unlevel surfaces with min A using LRAD. PT Short Term Goal 4 (Week 2): Patient will perform standing balanc e with single upper extremtiy support with supervision during a functional task >5 min.  Skilled Therapeutic Interventions/Progress Updates:    Patient in sidelying with wife in the room who reports he is just waking from a nap.  Attempted to initiate vestibular assessment in supine, but pt's eyes too narrow so performed supine to sit with S.  Patient relates the bed looks like it is moving.  Initiated vestibular assessment as noted below on EOB till pt declined to continue but agreeable to ambulation.  Patient's shoes donned with max A for time management.  Performed sit to stand to RW with CGA.  Ambulated into hallway and after about 40' both knees buckled and pt was lowered to the floor.  Noted no LOC and patient denied injury.  Patient assisted +3 total A up into w/c.  Assisted pt back to room and checked BP 108/47 in sitting with HR 78.  Noted no injury to knees and leg strength seemed as previous to incident.  Sit to stand to RW with CGA and checked BP standing, noted no symptoms of dizziness BP 99/59, HR 93.  RN and PA made aware.  Patient continued to report feeling weak, but agreeable to in bed therex.  Transfer standing pivot with RW and CGA to bed.  Sit to supine with S.  Performed exercises as noted below.  Patient agreeable to complete vestibular eval.  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  Patient returned to supine and left with wife in the room and call bell in reach/bed alarm set.   Therapy Documentation Precautions:  Precautions Precautions: Fall, Other (comment) Precaution Comments: Sinus precautions, may not blow nose, may not use straw Restrictions Weight Bearing Restrictions: No Other Position/Activity Restrictions: see above, sinus precuations  Pain: Pain Assessment Pain Scale: 0-10 Pain Score: 0-No pain Faces Pain Scale: Hurts little more Pain Type: Chronic pain Pain Location: Foot Pain Orientation: Right;Left Pain Descriptors / Indicators: Burning Pain Onset: On-going Pain Intervention(s): Distraction  Exercises: General Exercises - Lower Extremity Hip ABduction/ADduction: Strengthening;Both;15 reps;Sidelying (clamshell) Straight Leg Raises: Strengthening;Both;10 reps;Supine Total Joint Exercises Bridges: Strengthening;Both;10 reps;Supine Other Treatments:    Vestibular Assessment - 04/18/21 0001       Symptom Behavior   Subjective history of current problem Patient and wife report history of vertigo in the past that is transient and had not been much of an issue until now, but initially today report more headache than vetigo.    Type of Dizziness  "World moves"    Frequency of Dizziness intermittent    Duration of Dizziness  minutes to seconds    Symptom Nature Motion provoked;Spontaneous;Intermittent   at times in the past came on just walking from bathroom   Aggravating Factors Supine to sit;Turning head quickly;Spontaneous onset    Relieving Factors Comments   keeps eyes on stationary target   Progression of Symptoms No change since onset    History of similar episodes  yes, as noted previously      Oculomotor Exam   Oculomotor Alignment Normal    Ocular ROM WFL    Spontaneous Absent    Gaze-induced  Absent    Head shaking Horizontal Absent    Smooth Pursuits Intact    Saccades Intact      Vestibulo-Ocular Reflex   VOR 1 Head Only (x 1 viewing) intact with vertical and horizontal head movements without symptom provocation    VOR to Slow Head Movement Positive left   and slightly positive R, but more to L   VOR Cancellation Normal      Auditory   Comments diminished but intact to scratch test equally both ears      Positional Testing   Dix-Hallpike Dix-Hallpike Right;Dix-Hallpike Left    Horizontal Canal Testing Horizontal Canal Right;Horizontal Canal Left      Dix-Hallpike Right   Dix-Hallpike Right Duration 40 sec bed in trendelenbert head rotation    Dix-Hallpike Right Symptoms No nystagmus      Dix-Hallpike Left   Dix-Hallpike Left Duration 40 sec bed in trendelenberg head rotation    Dix-Hallpike Left Symptoms No nystagmus      Horizontal Canal Right   Horizontal Canal Right Duration 40 sec    Horizontal Canal Right Symptoms Normal      Horizontal Canal Left   Horizontal Canal Left Duration 40 sec    Horizontal Canal Left Symptoms Normal               Therapy/Group: Individual Therapy  Reginia Naas [place], [place] 04/18/2021, 6:02 PM

## 2021-04-18 NOTE — Progress Notes (Signed)
Speech Language Pathology TBI Note  Patient Details  Name: [name] MRN: [id number] Date of Birth: 07-09-1941  Today's Date: 04/18/2021 SLP Individual Time: 1015-1100 SLP Individual Time Calculation (min): 45 min  Short Term Goals: Week 2: SLP Short Term Goal 1 (Week 2): Pt will complete mildly complex problem solving tasks with min A SLP Short Term Goal 2 (Week 2): Pt will recall functional and novel information with min A cues following 10 minute delay SLP Short Term Goal 3 (Week 2): Pt will complete money management tasks with min A SLP Short Term Goal 4 (Week 2): Pt will increase selective attention by attending and re-directing to tasks in mildly distracting environment with Min A verbal cues  Skilled Therapeutic Interventions: Skilled ST treatment performed with focus on cognitive goals. Patient was sleeping on arrival and easy to arouse. However, sleepiness interfered with participation at times requiring min-to-mod verbal cues for sustained attention. Patient was emotional today, as he stated he is coping with getting older and realizing he may not be able to be as active as he once was. He demonstrated increased insight into his deficits and improved emerging awareness as he discussed his current limitations how that will effect him upon discharge, and the support he may require from his spouse. Patient was left in bed with alarm activated and needs within reach. Cont per ST POC.    Pain Pain Assessment Pain Scale: 0-10 Pain Score: 0-No pain  Agitated Behavior Scale: TBI Observation Details Observation Environment: patient room Start of observation period - Date: 04/18/21 Start of observation period - Time: 1015 End of observation period - Date: 04/18/21 End of observation period - Time: 1100 Agitated Behavior Scale (DO NOT LEAVE BLANKS) Short attention span, easy distractibility, inability to concentrate: Present to a slight degree Impulsive, impatient, low tolerance for  pain or frustration: Absent Uncooperative, resistant to care, demanding: Absent Violent and/or threatening violence toward people or property: Absent Explosive and/or unpredictable anger: Absent Rocking, rubbing, moaning, or other self-stimulating behavior: Absent Pulling at tubes, restraints, etc.: Absent Wandering from treatment areas: Absent Restlessness, pacing, excessive movement: Absent Repetitive behaviors, motor, and/or verbal: Absent Rapid, loud, or excessive talking: Absent Sudden changes of mood: Absent Easily initiated or excessive crying and/or laughter: Absent Self-abusiveness, physical and/or verbal: Absent Agitated behavior scale total score: 15  Therapy/Group: Individual Therapy  [name] 04/18/2021, 12:48 PM

## 2021-04-18 NOTE — Progress Notes (Signed)
Occupational Therapy TBI Note  Patient Details  Name: [name] MRN: [id number] Date of Birth: [birth year]  Today's Date: 04/18/2021 OT Individual Time: 1120-1205 OT Individual Time Calculation (min): 45 min    Short Term Goals: Week 2:  OT Short Term Goal 1 (Week 2): Pt will consistently transfer to bathroom with LRAD and CGA OT Short Term Goal 2 (Week 2): Pt will groom in standingw iht CGA OT Short Term Goal 3 (Week 2): Pt will bathe sit to stand wiht CGA OT Short Term Goal 4 (Week 2): Pt wil don footwear wiht MIN A  Skilled Therapeutic Interventions/Progress Updates:    Pt received supine, chatting appropriately with OT regarding sleep and pain when his wife, Fraser Din, arrived. Fraser Din raised concerns re financial burden of hospitalization, 24/7 supervision, access to OPOT, and IADLs. Pt demonstrated reduced awareness of deficits stating he would take care of all finances upon d/c. OT provided TBI related education and reduced judgement/insight, and resulting need for wife/other trusted family or friends to assist with all important decisions. Pt resistant to hearing this and dismissive of discussion re deficits. Also discussed methods to shower to maintain pt's modesty and have his wife assist instead. Pt completed bed mobility to EOB, reporting HA of 3/10, described as aching, and pt stating he is premedicated and has no further request for pain management. Pt required min A to don pants, to thread over the RLE and required cueing for positioning. He completed sit > stand with CGA. He completed 100 ft of functional mobility into the therapy gym using the RW with CGA overall. Remainder of session focused on dynamic standing balance using an airex foam pad to challenge ankle righting reactions especially, as pt demonstrated R foot inversion with reduced UE weight bearing. Min A required throughout to provide manual weightshift toward the L to offload RLE slight to increase neutral foot positioning. Pt  returned to his room and was left sitting in the recliner with all needs met. Chair alarm set.   Therapy Documentation Precautions:  Precautions Precautions: Fall, Other (comment) Precaution Comments: Sinus precautions, may not blow nose, may not use straw Restrictions Weight Bearing Restrictions: No Other Position/Activity Restrictions: see above, sinus precuations  Agitated Behavior Scale: TBI Observation Details Observation Environment: CIR Start of observation period - Date: 04/18/21 Start of observation period - Time: 1120 End of observation period - Date: 04/18/21 End of observation period - Time: 1205 Agitated Behavior Scale (DO NOT LEAVE BLANKS) Short attention span, easy distractibility, inability to concentrate: Present to a slight degree Impulsive, impatient, low tolerance for pain or frustration: Absent Uncooperative, resistant to care, demanding: Absent Violent and/or threatening violence toward people or property: Absent Explosive and/or unpredictable anger: Absent Rocking, rubbing, moaning, or other self-stimulating behavior: Absent Pulling at tubes, restraints, etc.: Absent Wandering from treatment areas: Absent Restlessness, pacing, excessive movement: Absent Repetitive behaviors, motor, and/or verbal: Absent Rapid, loud, or excessive talking: Absent Sudden changes of mood: Absent Easily initiated or excessive crying and/or laughter: Absent Self-abusiveness, physical and/or verbal: Absent Agitated behavior scale total score: 15   Therapy/Group: Individual Therapy  [name] 04/18/2021, 3:11 PM

## 2021-04-18 NOTE — Progress Notes (Signed)
Physical Therapy Session Note  Patient Details  Name: [name] MRN: [id number] Date of Birth: 12/02/1940  Today's Date: 04/18/2021 PT Individual Time: 1505-1540 PT Individual Time Calculation (min): 35 min   Short Term Goals: Week 2:  PT Short Term Goal 1 (Week 2): Patient will perform basic transfers with CGA-supervision using LRAD. PT Short Term Goal 2 (Week 2): Patient will ambulate >150 ft with CGA using LRAD. PT Short Term Goal 3 (Week 2): Patient will ambulate over at least 10 ft of unlevel surfaces with min A using LRAD. PT Short Term Goal 4 (Week 2): Patient will perform standing balanc e with single upper extremtiy support with supervision during a functional task >5 min.  Skilled Therapeutic Interventions/Progress Updates:     Patient in sitting EOB with nurse and his wife in the room upon PT arrival. Patient alert and agreeable to PT session. Patient denied pain during session.  Discussed patient's recent fall during previous PT session. Patient reports that he was asleep upon PT's arrival and "did not feel well" at beginning of session, but did not disclose this due to wanting to progress with PT. Denies dizziness, light headedness, or any symptoms pre-fall, stated "it just comes out of nowhere." Patient confirms that this event was the same as his falls in the past, stating "my legs just give out." Patient denied pain or injury from the fall and knees inspected and without visible injury.  Discussed fall history, importance of self-monitoring for symptoms and fatigue levels prior to mobilizing and communicating that to staff or his wife at d/c. Educated on energy conservation strategies to prevent fatigue and falls, importance of night time sleeping, and fall recovery. Plan to focus on fall recovery and fall prevention as part of PT POC during remainder of patient's stay. Patient appreciative of discussion and in agreement with POC.  Therapeutic Activity: Bed Mobility: Patient  performed sit to supine with mod I in a flat bed without use of bed rails.  Transfers: Patient performed sit to/from stand x2 with CGA for safety using a RW. Provided verbal cues for reaching back x1. Patient performed ambulatory transfer to/from the bathroom with CGA for safety using RW. No changes in gait noted from previous sessions, appears to be moving more cautiously today due to earlier events. Patient was continent of bladder during toileting, performed peri-care and lower body clothing management independently with CGA for balance and patient with single upper extremity support throughout.   Patient in bed with is wife at bedside at end of session with breaks locked, bed alarm set, and all needs within reach. Discussed OP vs HH therapy services with patient and his wife at end of session. Patient did not recall yesterday's conversation with PT and stated [place] would be fine. Reoriented patient to yesterday's conversation and his preference for OP services and the benefits of OPPT to progress mobility. Discussed strategies for therapy sessions being scheduled at the same time as his wife's and reaching out to community, friends, and family to assist to provide 24/7 supervision at d/c.   Therapy Documentation Precautions:  Precautions Precautions: Fall, Other (comment) Precaution Comments: Sinus precautions, may not blow nose, may not use straw Restrictions Weight Bearing Restrictions: No Other Position/Activity Restrictions: see above, sinus precuations Agitated Behavior Scale: TBI Observation Details Observation Environment: CIR Start of observation period - Date: 04/18/21 Start of observation period - Time: 1505 End of observation period - Date: 04/18/21 End of observation period - Time: 1540 Agitated Behavior Scale (  DO NOT LEAVE BLANKS) Short attention span, easy distractibility, inability to concentrate: Present to a slight degree Impulsive, impatient, low tolerance for pain or  frustration: Absent Uncooperative, resistant to care, demanding: Absent Violent and/or threatening violence toward people or property: Absent Explosive and/or unpredictable anger: Absent Rocking, rubbing, moaning, or other self-stimulating behavior: Absent Pulling at tubes, restraints, etc.: Absent Wandering from treatment areas: Absent Restlessness, pacing, excessive movement: Absent Repetitive behaviors, motor, and/or verbal: Absent Rapid, loud, or excessive talking: Absent Sudden changes of mood: Absent Easily initiated or excessive crying and/or laughter: Absent Self-abusiveness, physical and/or verbal: Absent Agitated behavior scale total score: 15      Therapy/Group: Individual Therapy  [name] [name] PT, DPT  04/18/2021, 9:56 PM

## 2021-04-18 NOTE — Progress Notes (Signed)
   04/18/21 1450  What Happened  Was fall witnessed? Yes  Who witnessed fall? Therapy  Patients activity before fall ambulating-assisted  Point of contact buttocks  Was patient injured? No  Follow Up  MD notified yes  Time MD notified 1450  Family notified Yes - comment  Time family notified 1430  Additional tests No  Simple treatment Other (comment) (assessed)  Adult Fall Risk Assessment  Risk Factor Category (scoring not indicated) High fall risk per protocol (document High fall risk)  Age 80  Fall History: Fall within 6 months prior to admission 0  Elimination; Bowel and/or Urine Incontinence 0  Elimination; Bowel and/or Urine Urgency/Frequency 0  Medications: includes PCA/Opiates, Anti-convulsants, Anti-hypertensives, Diuretics, Hypnotics, Laxatives, Sedatives, and Psychotropics 5  Patient Care Equipment 2  Mobility-Assistance 2  Mobility-Gait 2  Mobility-Sensory Deficit 0  Altered awareness of immediate physical environment 0  Impulsiveness 0  Lack of understanding of one's physical/cognitive limitations 0  Total Score 14  Patient Fall Risk Level High fall risk  Adult Fall Risk Interventions  Required Bundle Interventions *See Row Information* High fall risk - low, moderate, and high requirements implemented  Additional Interventions Use of appropriate toileting equipment (bedpan, BSC, etc.)  Screening for Fall Injury Risk (To be completed on HIGH fall risk patients) - Assessing Need for Floor Mats  Risk For Fall Injury- Criteria for Floor Mats None identified - No additional interventions needed  Will Implement Floor Mats Yes  Vitals  Temp 97.9 F (36.6 C)  BP 104/62  MAP (mmHg) 73  BP Location Left Arm  BP Method Automatic  Patient Position (if appropriate) Sitting  Pulse Rate 81  Resp 16  Oxygen Therapy  SpO2 98 %  O2 Device Room Air  Pain Assessment  Pain Scale 0-10  Pain Score 0  Neurological  Neuro (WDL) X  Level of Consciousness Alert  Orientation  Level Oriented X4  Cognition Follows commands  Motor Function/Sensation Assessment Grip;Motor response  Facial Symmetry Symmetrical  R Hand Grip Moderate  L Hand Grip Moderate   R Foot Dorsiflexion Moderate  L Foot Dorsiflexion Moderate  RUE Motor Response Purposeful movement  RUE Motor Strength 5  LUE Motor Response Purposeful movement  LUE Motor Strength 5  RLE Motor Response Purposeful movement  RLE Motor Strength 5  LLE Motor Response Purposeful movement  LLE Motor Strength 5  Musculoskeletal  Musculoskeletal (WDL) X  Assistive Device Front wheel walker  Generalized Weakness Yes  Weight Bearing Restrictions No  Integumentary  Integumentary (WDL) X  Skin Color Appropriate for ethnicity  Skin Condition Dry  Skin Integrity Ecchymosis  Ecchymosis Location Abdomen;Face  Ecchymosis Location Orientation Bilateral  Ecchymosis Intervention Other (Comment) (assessed)  Skin Turgor Non-tenting    Dayna Ramus, LPN
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
## 2021-04-19 LAB — GLUCOSE, CAPILLARY
Glucose-Capillary: 141 mg/dL — ABNORMAL HIGH (ref 70–99)
Glucose-Capillary: 173 mg/dL — ABNORMAL HIGH (ref 70–99)
Glucose-Capillary: 222 mg/dL — ABNORMAL HIGH (ref 70–99)
Glucose-Capillary: 181 mg/dL — ABNORMAL HIGH (ref 70–99)

## 2021-04-19 MED ORDER — TOPIRAMATE 25 MG PO TABS: 100.0000 mg | ORAL_TABLET | Freq: Every day | ORAL | Status: DC

## 2021-04-19 NOTE — Progress Notes (Signed)
Upon entering pts room, two pills were found on pts bedside table, and then another was found on the floor by EVS. A stool softener pill bottle was found in patients room that was brought from home. Medication given to charge nurse, and PA made aware.   Dayna Ramus

## 2021-04-19 NOTE — Progress Notes (Signed)
Occupational Therapy TBI Note  Patient Details  Name: [name] MRN: [id number] Date of Birth: [birth year]  Today's Date: 04/19/2021 OT Individual Time: 1100-1200 OT Individual Time Calculation (min): 60 min    Short Term Goals: Week 1:  OT Short Term Goal 1 (Week 1): Pt will thread BLE into pants OT Short Term Goal 2 (Week 1): Pt will sequence bathing body parts with no more than min cuing OT Short Term Goal 2 - Progress (Week 1): Met OT Short Term Goal 3 (Week 1): Pt will groom wiht no VC OT Short Term Goal 3 - Progress (Week 1): Met OT Short Term Goal 4 (Week 1): Pt will trnasfer to toilet/BSC with MOD A OT Short Term Goal 4 - Progress (Week 1): Met  Skilled Therapeutic Interventions/Progress Updates:     Pt received in w/c with direct handoff from [place]. Pain reported in lower back "not too bad"  ADL:  Initially finishing eating and reviewed orientation d/t earlier confusion while finishing breakfast. Pt completes footwear with A to don teds but S to don shoes in figure 4   Therapeutic exercise Pt completes 2x10-15 3 # dumbell therex in seated position with min demo cuing to improve BUE strengthening required for BADLs and functional transfers.   Shoulder flex/ext, alternating punches,shoulder press Elbow flex/ext,   Therapeutic activity Pt completes standing balance activities: floor clock- floor clock with cone taps. Graded task up to require pt to follow direction on which LE to which color. Pt requires MIN VC overall for use of mirror to manage posture for visual cues. Pt then stands on compliant wedge for calf stretch and anterior weight shift with lateral and crossing midline reaching to elevated head height surface in min ranges outside BOS.  Pt left at end of session in bed with exit alarm on, call light in reach and all needs met   Therapy Documentation Precautions:  Precautions Precautions: Fall, Other (comment) Precaution Comments: Sinus precautions, may not  blow nose, may not use straw Restrictions Weight Bearing Restrictions: No Other Position/Activity Restrictions: see above, sinus precuations General:   Vital Signs:   Pain: Pain Assessment Pain Scale: 0-10 Pain Score: 2  Pain Location: Head Pain Descriptors / Indicators: Aching Pain Onset: On-going Pain Intervention(s): MD notified (Comment) Agitated Behavior Scale: TBI  Observation Details Observation Environment: pt room Start of observation period - Date: 04/19/21 Start of observation period - Time: 1100 End of observation period - Date: 04/19/21 End of observation period - Time: 1200 Agitated Behavior Scale (DO NOT LEAVE BLANKS) Short attention span, easy distractibility, inability to concentrate: Present to a slight degree Impulsive, impatient, low tolerance for pain or frustration: Absent Uncooperative, resistant to care, demanding: Absent Violent and/or threatening violence toward people or property: Absent Explosive and/or unpredictable anger: Absent Rocking, rubbing, moaning, or other self-stimulating behavior: Absent Pulling at tubes, restraints, etc.: Absent Wandering from treatment areas: Absent Restlessness, pacing, excessive movement: Absent Repetitive behaviors, motor, and/or verbal: Absent Rapid, loud, or excessive talking: Absent Sudden changes of mood: Absent Easily initiated or excessive crying and/or laughter: Absent Self-abusiveness, physical and/or verbal: Absent Agitated behavior scale total score: 15  ADL: ADL Grooming: Supervision/safety Where Assessed-Grooming: Edge of bed Upper Body Bathing: Supervision/safety Where Assessed-Upper Body Bathing: Edge of bed Lower Body Bathing: Maximal assistance Where Assessed-Lower Body Bathing: Edge of bed, Bed level Upper Body Dressing: Supervision/safety Lower Body Dressing: Maximal assistance Where Assessed-Lower Body Dressing: Bed level Toileting: Unable to assess Toilet Transfer: Unable to  assess Vision  Perception    Praxis   Exercises:   Other Treatments:     Therapy/Group: Individual Therapy  Tonny Branch 04/19/2021, 12:11 PM
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
## 2021-04-19 NOTE — Progress Notes (Signed)
Speech Language Pathology TBI Note  Patient Details  Name: [name] MRN: [id number] Date of Birth: Apr 25, 1941  Today's Date: 04/19/2021 SLP Individual Time: 1015-1100 SLP Individual Time Calculation (min): 45 min  Short Term Goals: Week 2: SLP Short Term Goal 1 (Week 2): Pt will complete mildly complex problem solving tasks with min A SLP Short Term Goal 2 (Week 2): Pt will recall functional and novel information with min A cues following 10 minute delay SLP Short Term Goal 3 (Week 2): Pt will complete money management tasks with min A SLP Short Term Goal 4 (Week 2): Pt will increase selective attention by attending and re-directing to tasks in mildly distracting environment with Min A verbal cues  Skilled Therapeutic Interventions: Skilled ST treatment performed with focus on cognitive goals. Patient was sleeping on arrival and required verbal and tactile stimulation to arouse. Remained sleepy for majority of session. Patient was oriented to person and situation (although took additional processing time for situation as patient was initially unsure). Required mod verbal and contextual cues to orient to time and place (initially thought he was in a hotel). After waking more he was able to provide more accurate detail of current situation and appeared to experience more mental clarity. SLP facilitated ADL at sink involving washing his face, and brushing hair with initial verbal cues to initiate, otherwise performed independently. Assisted patient with set-up of morning meal. Patient required Min A verbal cues for selective attention and re-direction to tasks with minimal external distractions including light background noise from hallway. Patient was passed off to PT. Continue per ST POC.  Pain 2/10 headache. Nurse made aware  Agitated Behavior Scale: TBI Observation Details Observation Environment: patient room Start of observation period - Date: 04/19/21 Start of observation period - Time:  1015 End of observation period - Date: 04/19/21 End of observation period - Time: 1100 Agitated Behavior Scale (DO NOT LEAVE BLANKS) Short attention span, easy distractibility, inability to concentrate: Present to a slight degree Impulsive, impatient, low tolerance for pain or frustration: Absent Uncooperative, resistant to care, demanding: Absent Violent and/or threatening violence toward people or property: Absent Explosive and/or unpredictable anger: Absent Rocking, rubbing, moaning, or other self-stimulating behavior: Absent Pulling at tubes, restraints, etc.: Absent Wandering from treatment areas: Absent Restlessness, pacing, excessive movement: Absent Repetitive behaviors, motor, and/or verbal: Absent Rapid, loud, or excessive talking: Absent Sudden changes of mood: Absent Easily initiated or excessive crying and/or laughter: Absent Self-abusiveness, physical and/or verbal: Absent Agitated behavior scale total score: 15  Therapy/Group: Individual Therapy  [name] 04/19/2021, 4:03 PM

## 2021-04-19 NOTE — Progress Notes (Signed)
Occupational Therapy Session Note  Patient Details  Name: [name] MRN: [id number] Date of Birth: [birth year]  Today's Date: 04/19/2021 OT Individual Time: 1305-1405 OT Individual Time Calculation (min): 60 min    Short Term Goals: Week 2:  OT Short Term Goal 1 (Week 2): Pt will consistently transfer to bathroom with LRAD and CGA OT Short Term Goal 2 (Week 2): Pt will groom in standingw iht CGA OT Short Term Goal 3 (Week 2): Pt will bathe sit to stand wiht CGA OT Short Term Goal 4 (Week 2): Pt wil don footwear wiht MIN A   Skilled Therapeutic Interventions/Progress Updates:    Pt sitting up in w/c, wife leaving as OT entered.  Pt pleasantly joking with wife and therapist and agreeable to OT session.  No c/o pain throughout session.  Pt agreeable to taking a shower with reassurance of respecting his modesty during the task. Pt verbalized understanding of his need for some assistance during bathing, dressing, and mobility tasks.  Pt completed sit to stand at [facility] with CGA and tactile/verbal cues to correct posterior weight shift.  Pt ambulated to bathroom and then completed stand to sit at walk in shower tub bench with CGA using RW and grab bar.  Pt doffed shirt with distant supervision.  TED hose doffed with max assist.  Socks and shoes doffed with supervision.  Shorts and boxers doffed with CGA and needing Vcs to initiate seated position (pt attempting to complete standing) to doff over feet for increased safety.   Pt bathed UB with supervision and LB with CGA using grab bar during standing portion when washing buttocks (also requiring tactile/verbal cues to shift weight forward to prevent losing balance posteriorly).  Pt donned boxers at sit<>stand level with CGA then ambulated with RW back to bedroom. Stand to sit at w/c with close supervision. Donned pants with CGA. Max assist to donn TED hose.  Socks and shoes donned with total dependence only secondary to time constraint of therapist.  Call  bell in reach, seat alarm on at end of session.    Therapy Documentation Precautions:  Precautions Precautions: Fall, Other (comment) Precaution Comments: Sinus precautions, may not blow nose, may not use straw Restrictions Weight Bearing Restrictions: No Other Position/Activity Restrictions: see above, sinus precuations    Therapy/Group: Individual Therapy  [name] 04/19/2021, 2:52 PM

## 2021-04-19 NOTE — Progress Notes (Signed)
Physical Therapy Session Note  Patient Details  Name: [name] MRN: [id number] Date of Birth: 10/27/1941  Today's Date: 04/19/2021 PT Individual Time: 1430-1530 PT Individual Time Calculation (min): 60 min   Short Term Goals: Week 2:  PT Short Term Goal 1 (Week 2): Patient will perform basic transfers with CGA-supervision using LRAD. PT Short Term Goal 2 (Week 2): Patient will ambulate >150 ft with CGA using LRAD. PT Short Term Goal 3 (Week 2): Patient will ambulate over at least 10 ft of unlevel surfaces with min A using LRAD. PT Short Term Goal 4 (Week 2): Patient will perform standing balanc e with single upper extremtiy support with supervision during a functional task >5 min.  Skilled Therapeutic Interventions/Progress Updates:     Patient in w/c asleep upon PT arrival. Patient easily aroused and agreeable to PT session. Patient denied pain during session, reports increased fatigue from showering with OT just prior to session. Discussed safety awareness and fall prevention education reviewed yesterday and patient able to determine with min cues that he should not get up to walk at this time due to increased fatigue to prevent falls, PT in agreement and patient transported dependently in the w/c during session for energy conservation and patient safety.   Therapeutic Activity: Bed Mobility: Patient performed sit to supine with mod I for increased time.  Transfers: Patient performed blocked practice squat pivot transfers x5 with min A progressing to supervision. Educated patient on benefits of this transfer when fatigued due to reduced energy expenditure and squat position preventing knees from buckling in standing. Patient attentive and appreciative of education. Provided verbal cues for hand placement and head-hips relationship during transfers, progressed from max to min cues. PT demonstrated a floor transfer/fall recovery from sitting<>lying and sitting<>kneeling (as this is the  position the patient tends to fall to). Educated on signs and symptoms to assess during self-assessment in the event of a fall and symptoms that indicate immediate medical attention. Instructed patient to have his phone in his pocket at all times to have access to call emergency services and not to get up if any sign of critical injury, symptoms, or LOC. Then instructed patient if no signs or symptoms present, he should call for assistance from another person, crawl to the nearest stable place to sit and perform a transfer from the floor to the seat, not to standing for safety to reduce risk of a second fall. Patient then performed a floor transfer from mat table<>mat on the floor then mat table<>knee with min A-CGA demonstrating good technique following PT instruction.   Patient pleased with his performance, noted some decreased upper body strength from PTA, however, did feel confident that his strength was good for his age. Engaged in arm wresting with PT for patient moral, patient demonstrated sufficient strength to win the match and in good spirits after, however reporting fatigue and requesting to return to bed to rest.  Patient in bed, applied lotion to B upper extremities due to dry skin, at end of session with breaks locked, bed alarm set, and all needs within reach. Patient missed 15 min of skilled PT due to fatigue, RN made aware. Will attempt to make-up missed time as able.    Therapy Documentation Precautions:  Precautions Precautions: Fall, Other (comment) Precaution Comments: Sinus precautions, may not blow nose, may not use straw Restrictions Weight Bearing Restrictions: No Other Position/Activity Restrictions: see above, sinus precuations General: PT Amount of Missed Time (min): 15 Minutes PT Missed Treatment  Reason: Patient fatigue Agitated Behavior Scale: TBI  Observation Details Observation Environment: pt room Start of observation period - Date: 04/19/21 Start of observation  period - Time: 1430 End of observation period - Date: 04/19/21 End of observation period - Time: 1530 Agitated Behavior Scale (DO NOT LEAVE BLANKS) Short attention span, easy distractibility, inability to concentrate: Present to a slight degree Impulsive, impatient, low tolerance for pain or frustration: Absent Uncooperative, resistant to care, demanding: Absent Violent and/or threatening violence toward people or property: Absent Explosive and/or unpredictable anger: Absent Rocking, rubbing, moaning, or other self-stimulating behavior: Absent Pulling at tubes, restraints, etc.: Absent Wandering from treatment areas: Absent Restlessness, pacing, excessive movement: Absent Repetitive behaviors, motor, and/or verbal: Absent Rapid, loud, or excessive talking: Absent Sudden changes of mood: Absent Easily initiated or excessive crying and/or laughter: Absent Self-abusiveness, physical and/or verbal: Absent Agitated behavior scale total score: 15    Therapy/Group: Individual Therapy  [name] PT, DPT  04/19/2021, 4:41 PM

## 2021-04-20 LAB — GLUCOSE, CAPILLARY
Glucose-Capillary: 125 mg/dL — ABNORMAL HIGH (ref 70–99)
Glucose-Capillary: 124 mg/dL — ABNORMAL HIGH (ref 70–99)
Glucose-Capillary: 135 mg/dL — ABNORMAL HIGH (ref 70–99)
Glucose-Capillary: 166 mg/dL — ABNORMAL HIGH (ref 70–99)

## 2021-04-20 MED ORDER — METFORMIN HCL 500 MG PO TABS
1000.0000 mg | ORAL_TABLET | Freq: Two times a day (BID) | ORAL | Status: DC
  Administered 2021-04-20 – 2021-04-28 (×16): 1000 mg via ORAL
  Filled 2021-04-20 (×16): qty 2

## 2021-04-20 MED ORDER — METOPROLOL TARTRATE 12.5 MG HALF TABLET
12.5000 mg | ORAL_TABLET | Freq: Two times a day (BID) | ORAL | Status: DC
  Administered 2021-04-20 – 2021-04-21 (×2): 12.5 mg via ORAL
  Filled 2021-04-20 (×2): qty 1

## 2021-04-20 MED ORDER — ENSURE ENLIVE PO LIQD
237.0000 mL | Freq: Two times a day (BID) | ORAL | Status: DC
  Administered 2021-04-20 – 2021-04-25 (×11): 237 mL via ORAL

## 2021-04-20 MED ORDER — TOPIRAMATE 100 MG PO TABS
200.0000 mg | ORAL_TABLET | Freq: Every day | ORAL | Status: DC
  Administered 2021-04-20 – 2021-04-27 (×8): 200 mg via ORAL
  Filled 2021-04-20: qty 8
  Filled 2021-04-20: qty 2
  Filled 2021-04-20 (×3): qty 8
  Filled 2021-04-20 (×2): qty 2
  Filled 2021-04-20: qty 8

## 2021-04-20 NOTE — Progress Notes (Signed)
Physical Therapy Session Note  Patient Details  Name: [name] MRN: [id number] Date of Birth: Jan 03, 1941  Today's Date: 04/20/2021 PT Individual Time: 1016-1115 PT Individual Time Calculation (min): 59 min   Short Term Goals: Week 2:  PT Short Term Goal 1 (Week 2): Patient will perform basic transfers with CGA-supervision using LRAD. PT Short Term Goal 2 (Week 2): Patient will ambulate >150 ft with CGA using LRAD. PT Short Term Goal 3 (Week 2): Patient will ambulate over at least 10 ft of unlevel surfaces with min A using LRAD. PT Short Term Goal 4 (Week 2): Patient will perform standing balanc e with single upper extremtiy support with supervision during a functional task >5 min.  Skilled Therapeutic Interventions/Progress Updates: Pt presents supine in nbed asleep, but able to arouse.  Pt c/o HA and dizziness, but agreeable to therapy.  Pt performed LE HS to increase circulation and improve alertness.  Pt transfers sup to sit w/ superviision, but verbal cues for performing more log roll technique.  Pt states feeling woozy.  Pt required total A to don shoes at EOB w/o LOB.  Pt states improved after sitting x 3'.  Pt performed sit to stand w/ CGA.  Pt performed step pivot to w/c w/ CGA and RW. Pt wheeled to sink and brushed teeth, washed face and brushed hair in standing w/ min A, 2/2 using BUEs simultaneously.  Pt wheeled to Dayroom for time conservation.  Pt amb multiple trials w/ RW and CGA and verbal cues for posture and BOS.  Pt amb up to 50' including turns to return to seat.  Pt required seated rest break between trials 2/2 fatigue.  Pt states feeling better w/ pain decreased at conclusion of therapy session.  Pt amb to recliner and remained sitting w/ seat alarm on and all needs in reach.  OT session to begin in 15'.     Therapy Documentation Precautions:  Precautions Precautions: Fall, Other (comment) Precaution Comments: Sinus precautions, may not blow nose, may not use  straw Restrictions Weight Bearing Restrictions: No Other Position/Activity Restrictions: see above, sinus precuations General:   Vital Signs:  Pain:5 10 HA and face, but decreased during session. Pain Assessment Pain Scale: 0-10 Pain Score: 6  Pain Type: Chronic pain Pain Location: Head Pain Intervention(s): Medication (See eMAR) Mobility:      Therapy/Group: Individual Therapy  [name] 04/20/2021, 11:15 AM

## 2021-04-20 NOTE — Progress Notes (Signed)
Speech Language Pathology Weekly Progress and Session Note  Patient Details  Name: [name] MRN: [id number] Date of Birth: April 02, 1941  Beginning of progress report period: April 13, 2021 End of progress report period: April 20, 2021  Today's Date: 04/20/2021 SLP Individual Time: 1300-1400 SLP Individual Time Calculation (min): 60 min  Short Term Goals: Week 2: SLP Short Term Goal 1 (Week 2): Pt will complete mildly complex problem solving tasks with min A SLP Short Term Goal 1 - Progress (Week 2): Met SLP Short Term Goal 2 (Week 2): Pt will recall functional and novel information with min A cues following 10 minute delay SLP Short Term Goal 2 - Progress (Week 2): Not met SLP Short Term Goal 3 (Week 2): Pt will complete money management tasks with min A SLP Short Term Goal 3 - Progress (Week 2): Not met SLP Short Term Goal 4 (Week 2): Pt will increase selective attention by attending and re-directing to tasks in mildly distracting environment with Min A verbal cues SLP Short Term Goal 4 - Progress (Week 2): Met    New Short Term Goals: Week 3: SLP Short Term Goal 1 (Week 3): STG = LTG due to ELOS  Weekly Progress Updates: Pt has made slow but steady progress and has met 2 out of 4 short term goals this reporting period. Pt continues with limitation in awareness of cognitive deficits and moments of agitation with tasks. Pt demonstrates improvement in selective attention in mildly distracting environment, continues to benefit from education on optimal environment at home to increase focus and safety. Pt continues to require mod A for higher level cognitive tasks such as money management, wife can assist as needed at discharge. Focus of ongoing therapy will be on LTGs for planned discharge home 7/2.  Intensity: Minumum of 1-2 x/day, 30 to 90 minutes Frequency: 3 to 5 out of 7 days Duration/Length of Stay: 7/2 Treatment/Interventions: Cognitive remediation/compensation;Cueing  hierarchy;Functional tasks;Therapeutic Activities;Therapeutic Exercise;Medication managment;Patient/family education   Daily Session  Skilled Therapeutic Interventions: Pt seen for skilled ST with focus on cognitive goals, wife present throughout. Pt participating in re-assessment of SLUMS with a score of 23/30 suggesting continued mild cognitive deficits. Pt with noted improvement in clock drawing (organizing, planning) with decreased working memory and orientation (off by one day of the week). Pt expresses uncertainty with cognitive ability at discharge, questions if he can continue higher level cognitive tasks. Educated on utilizing wife, friends and family as needed for safety with med, money and home management. SLP facilitating safety/awareness task related to potential hazard recognition and fall risk at home requiring min A cues. Pt states he wants to walk in the room himself and when explained rationale behind being a fall risk pt states "well I was falling at home too". Pt and family will benefit from ongoing education/training in safety and awareness of current physical and cognitive deficits + their implications on mobility. Pt left in bed with wife present and all needs within reach. Cont ST POC.      General    Pain Pain Assessment Pain Scale: 0-10 Pain Score: 0-No pain  Therapy/Group: Individual Therapy  Dewaine Conger 04/20/2021, 2:18 PM
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
## 2021-04-20 NOTE — Progress Notes (Signed)
Patient received asleep until after MN. Assisted to BR to void, and back to bed. Since then, have not slept, complains of HA and was given his PRN meds.Vital signs WNL. Had snacks as requested. Encouraged to rest to be ready for therapy today.
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
## 2021-04-20 NOTE — Progress Notes (Signed)
Occupational Therapy TBI Note  Patient Details  Name: MATTHEWS FRANKS MRN: [id number] Date of Birth: 1941/04/22  Today's Date: 04/20/2021 OT Individual Time: 1400-1515 OT Individual Time Calculation (min): 75 min    Short Term Goals: Week 2:  OT Short Term Goal 1 (Week 2): Pt will consistently transfer to bathroom with LRAD and CGA OT Short Term Goal 2 (Week 2): Pt will groom in standingw iht CGA OT Short Term Goal 3 (Week 2): Pt will bathe sit to stand wiht CGA OT Short Term Goal 4 (Week 2): Pt wil don footwear wiht MIN A  Skilled Therapeutic Interventions/Progress Updates:     Pt received in bed with 0 out of 10 pain   ADL Family Education  Pt completes full BADL with wife providing CGA/supervision for all mobility and ADLs. Pt requires coaching for threading pants appropriately and wife with passive, kind personality needing encouragement to not help with seated ADL tasks and get closer to pts during mobility. Below are various topics with education provided throughout session.  Provided the following in a handout  General mobility- they should always use their RW. They may benefit from a walker tray to transport items from room to room if walking with a walker is recommended by physical therapy. The walker should be kept within reach so they can pull it close to get up and keep with them to back up to any surface they want to sit on. When getting up, they should push up from the surface they are getting up from and reach back when sitting to a new surface, no plopping.  You are their "shadow." Especially in the beginning. You, as the helper should be in reach of the patient when mobilizing. You should be either beside or behind them so if they lose their balance you can assist by helping correct at the hips. This is likely closer than you are used to being- be in their personal space. Use a gait belt if that makes you feel more comfortable If you are attempting to get up/transfer and it is  not going well, reset. Have them sit back down. Make sure they are close to the edge of the seat, feet are underneath them at hips distance, and they are leaning forward to stand up. Bathing- they should sit to bathe on a shower chair, especially for washing legs/feet. Sitting will save energy and increase safety. For a tub shower with shower chair: Use the walker to walk up to shower/tub edge and leave it to the side, but close. they can use the wall to steady as they step over or a grab bar. Do your best to dry off the floor prior to getting out of the shower For tub shower with Tub bench: use walker to get to the edge of the tub bench, back up to the edge, reach back prior to sitting down. Turn to swing legs into tub and scoot across. Reverse to exit the tub. Make sure both legs are out of the tub prior to standing to exit the bathroom Walk in shower: walk up to the shower ledge, turn around and back up to the ledge with the walker. Keep both hands-on walker while they step back one foot at a time Dressing- all should be done from a SEATED level, especially to put underwear and pants over feet.  Toileting- the RW can be walked right over the toilet for standing urination if applicable. If seated toileting is more appropriate, have them walk  up to the toilet and keep walker with them as they turn to sit to toilet or BSC. Before they stand to pull up pants past hips they should pull pants/underwear up past their knees to decrease the need to bend forward to the floor. Sometimes this makes people dizzy if incontinence/bathroom accidents are an issue attempt to toilet every 2-3 hours to improve success with toileting and decrease accidents.  Energy conservation principles- Prioritize what needs to be done and what can be moved to another day Plan out their days, weeks, months to spread out taxing (physical or cognitively tiring) activities to not put too much at one time Pace activities- rest before  feeling tired and have designated places to rest if they feel tired and need to take a brake Position for success: sit when able to conserve 25% more energy than standing  Pt left at end of session in bed with exit alarm on, call light in reach and all needs met   Therapy Documentation Precautions:  Precautions Precautions: Fall, Other (comment) Precaution Comments: Sinus precautions, may not blow nose, may not use straw Restrictions Weight Bearing Restrictions: No Other Position/Activity Restrictions: see above, sinus precuations General:   Vital Signs: Therapy Vitals Pulse Rate: 71 Resp: 18 BP: (!) 106/53 Patient Position (if appropriate): Lying Oxygen Therapy SpO2: 95 % O2 Device: Room Air Pain: Pain Assessment Pain Score: 0-No pain Faces Pain Scale: No hurt PAINAD (Pain Assessment in Advanced Dementia) Breathing: normal Negative Vocalization: none Facial Expression: smiling or inexpressive Body Language: relaxed Consolability: no need to console PAINAD Score: 0 Agitated Behavior Scale: TBI  Observation Details Observation Environment: pt room Start of observation period - Date: 04/20/21 Start of observation period - Time: 1400 End of observation period - Date: 04/20/21 End of observation period - Time: 1515 Agitated Behavior Scale (DO NOT LEAVE BLANKS) Short attention span, easy distractibility, inability to concentrate: Absent Impulsive, impatient, low tolerance for pain or frustration: Absent Uncooperative, resistant to care, demanding: Present to a slight degree Violent and/or threatening violence toward people or property: Absent Explosive and/or unpredictable anger: Absent Rocking, rubbing, moaning, or other self-stimulating behavior: Absent Pulling at tubes, restraints, etc.: Absent Wandering from treatment areas: Absent Restlessness, pacing, excessive movement: Absent Repetitive behaviors, motor, and/or verbal: Absent Rapid, loud, or excessive  talking: Absent Sudden changes of mood: Absent Easily initiated or excessive crying and/or laughter: Absent Self-abusiveness, physical and/or verbal: Absent Agitated behavior scale total score: 15  ADL: ADL Grooming: Supervision/safety Where Assessed-Grooming: Edge of bed Upper Body Bathing: Supervision/safety Where Assessed-Upper Body Bathing: Edge of bed Lower Body Bathing: Maximal assistance Where Assessed-Lower Body Bathing: Edge of bed, Bed level Upper Body Dressing: Supervision/safety Lower Body Dressing: Maximal assistance Where Assessed-Lower Body Dressing: Bed level Toileting: Unable to assess Toilet Transfer: Unable to assess Vision   Perception    Praxis   Exercises:   Other Treatments:     Therapy/Group: Individual Therapy  Tonny Branch 04/20/2021, 6:56 AM

## 2021-04-20 NOTE — Progress Notes (Signed)
Occupational Therapy TBI Note  Patient Details  Name: [name] MRN: [id number] Date of Birth: 11/06/1940  Today's Date: 04/20/2021 OT Individual Time: 1130-1200 OT Individual Time Calculation (min): 30 min    Short Term Goals: Week 1:  OT Short Term Goal 1 (Week 1): Pt will thread BLE into pants OT Short Term Goal 2 (Week 1): Pt will sequence bathing body parts with no more than min cuing OT Short Term Goal 2 - Progress (Week 1): Met OT Short Term Goal 3 (Week 1): Pt will groom wiht no VC OT Short Term Goal 3 - Progress (Week 1): Met OT Short Term Goal 4 (Week 1): Pt will trnasfer to toilet/BSC with MOD A OT Short Term Goal 4 - Progress (Week 1): Met  Skilled Therapeutic Interventions/Progress Updates:    Pt received in recliner in room and consented to OT tx. Session focused on standing checkers activity with BUE support and CGA-close SUP for standing while playing checkers. Completed activity to improive standing tolerance, functional reach, balance, and cognition skilled for improved ADL and IADL performance. Pt demo's good planning, sequencing and executive function during activity. No LOB noted with BUEs supported at table top. After tx, pt transferred back to bed SPT with bed rail and CGA,  left sitting EOB with all needs in reach, bed alarm on, NT present.   Therapy Documentation Precautions:  Precautions Precautions: Fall, Other (comment) Precaution Comments: Sinus precautions, may not blow nose, may not use straw Restrictions Weight Bearing Restrictions: No Other Position/Activity Restrictions: see above, sinus precuations  Pain: Pain Assessment Pain Score: 0-No pain Agitated Behavior Scale: TBI Observation Details Observation Environment: therapy gym Start of observation period - Date: 04/20/21 Start of observation period - Time: 1130 End of observation period - Date: 04/20/21 End of observation period - Time: 1200 Agitated Behavior Scale (DO NOT LEAVE  BLANKS) Short attention span, easy distractibility, inability to concentrate: Absent Impulsive, impatient, low tolerance for pain or frustration: Absent Uncooperative, resistant to care, demanding: Absent Violent and/or threatening violence toward people or property: Absent Explosive and/or unpredictable anger: Absent Rocking, rubbing, moaning, or other self-stimulating behavior: Absent Pulling at tubes, restraints, etc.: Absent Wandering from treatment areas: Absent Restlessness, pacing, excessive movement: Absent Repetitive behaviors, motor, and/or verbal: Absent Rapid, loud, or excessive talking: Absent Sudden changes of mood: Absent Easily initiated or excessive crying and/or laughter: Absent Self-abusiveness, physical and/or verbal: Absent Agitated behavior scale total score: 14    Therapy/Group: Individual Therapy  Nahmir Zeidman 04/20/2021, 12:50 PM

## 2021-04-21 LAB — GLUCOSE, CAPILLARY
Glucose-Capillary: 132 mg/dL — ABNORMAL HIGH (ref 70–99)
Glucose-Capillary: 127 mg/dL — ABNORMAL HIGH (ref 70–99)
Glucose-Capillary: 176 mg/dL — ABNORMAL HIGH (ref 70–99)
Glucose-Capillary: 211 mg/dL — ABNORMAL HIGH (ref 70–99)

## 2021-04-21 MED ORDER — METOPROLOL TARTRATE 12.5 MG HALF TABLET
12.5000 mg | ORAL_TABLET | Freq: Every day | ORAL | Status: DC
  Administered 2021-04-22 – 2021-04-28 (×7): 12.5 mg via ORAL
  Filled 2021-04-21 (×7): qty 1

## 2021-04-21 NOTE — Plan of Care (Signed)
  Problem: Consults Goal: RH BRAIN INJURY PATIENT EDUCATION Description: Description: See Patient Education module for eduction specifics Outcome: Progressing Goal: Skin Care Protocol Initiated - if Braden Score 18 or less Description: If consults are not indicated, leave blank or document N/A Outcome: Progressing Goal: Nutrition Consult-if indicated Outcome: Progressing Goal: Diabetes Guidelines if Diabetic/Glucose > 140 Description: If diabetic or lab glucose is > 140 mg/dl - Initiate Diabetes/Hyperglycemia Guidelines & Document Interventions  Outcome: Progressing   Problem: RH BOWEL ELIMINATION Goal: RH STG MANAGE BOWEL WITH ASSISTANCE Description: STG Manage Bowel with Supervision Assistance. Outcome: Progressing Goal: RH STG MANAGE BOWEL W/MEDICATION W/ASSISTANCE Description: STG Manage Bowel with Medication with Supervision Assistance. Outcome: Progressing   Problem: RH BLADDER ELIMINATION Goal: RH STG MANAGE BLADDER WITH ASSISTANCE Description: STG Manage Bladder With Supervision Assistance Outcome: Progressing Goal: RH STG MANAGE BLADDER WITH MEDICATION WITH ASSISTANCE Description: STG Manage Bladder With Medication With Supervision Assistance. Outcome: Progressing   Problem: RH SKIN INTEGRITY Goal: RH STG ABLE TO PERFORM INCISION/WOUND CARE W/ASSISTANCE Description: STG Able To Perform Incision/Wound Care With Supervision Assistance. Outcome: Progressing   Problem: RH SAFETY Goal: RH STG ADHERE TO SAFETY PRECAUTIONS W/ASSISTANCE/DEVICE Description: STG Adhere to Safety Precautions With Cues and Reminders. Outcome: Progressing Goal: RH STG DECREASED RISK OF FALL WITH ASSISTANCE Description: STG Decreased Risk of Fall With Cues and Reminders. Outcome: Progressing   Problem: RH COGNITION-NURSING Goal: RH STG USES MEMORY AIDS/STRATEGIES W/ASSIST TO PROBLEM SOLVE Description: STG Uses Memory Aids/Strategies With Cues and Reminders to Problem Solve. Outcome:  Progressing Goal: RH STG ANTICIPATES NEEDS/CALLS FOR ASSIST W/ASSIST/CUES Description: STG Anticipates Needs/Calls for Assist With Cues and Reminders. Outcome: Progressing   Problem: RH PAIN MANAGEMENT Goal: RH STG PAIN MANAGED AT OR BELOW PT'S PAIN GOAL Description: < 3 on a 0-10 pain scale. Outcome: Progressing   Problem: RH KNOWLEDGE DEFICIT BRAIN INJURY Goal: RH STG INCREASE KNOWLEDGE OF SELF CARE AFTER BRAIN INJURY Description: Patient will demonstrate knowledge of medication management, pain management, bowel/bladder management, skin/wound care with educational materials and handouts provided by staff independently at discharge. Outcome: Progressing
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
## 2021-04-21 NOTE — Plan of Care (Signed)
  Problem: Consults Goal: RH BRAIN INJURY PATIENT EDUCATION Description: Description: See Patient Education module for eduction specifics 04/21/2021 1557 by Renda Rolls L, LPN Outcome: Progressing 04/21/2021 1556 by Renda Rolls L, LPN Outcome: Progressing Goal: Skin Care Protocol Initiated - if Braden Score 18 or less Description: If consults are not indicated, leave blank or document N/A 04/21/2021 1557 by Renda Rolls L, LPN Outcome: Progressing 04/21/2021 1556 by Renda Rolls L, LPN Outcome: Progressing Goal: Nutrition Consult-if indicated 04/21/2021 1557 by Renda Rolls L, LPN Outcome: Progressing 04/21/2021 1556 by Renda Rolls L, LPN Outcome: Progressing Goal: Diabetes Guidelines if Diabetic/Glucose > 140 Description: If diabetic or lab glucose is > 140 mg/dl - Initiate Diabetes/Hyperglycemia Guidelines & Document Interventions  04/21/2021 1557 by Renda Rolls L, LPN Outcome: Progressing 04/21/2021 1556 by Renda Rolls L, LPN Outcome: Progressing   Problem: RH BOWEL ELIMINATION Goal: RH STG MANAGE BOWEL WITH ASSISTANCE Description: STG Manage Bowel with Supervision Assistance. 04/21/2021 1557 by Renda Rolls L, LPN Outcome: Progressing 04/21/2021 1556 by Sanda Linger, LPN Outcome: Progressing Goal: RH STG MANAGE BOWEL W/MEDICATION W/ASSISTANCE Description: STG Manage Bowel with Medication with Supervision Assistance. 04/21/2021 1557 by Renda Rolls L, LPN Outcome: Progressing 04/21/2021 1556 by Renda Rolls L, LPN Outcome: Progressing   Problem: RH BLADDER ELIMINATION Goal: RH STG MANAGE BLADDER WITH ASSISTANCE Description: STG Manage Bladder With Supervision Assistance 04/21/2021 1557 by Renda Rolls L, LPN Outcome: Progressing 04/21/2021 1556 by Sanda Linger, LPN Outcome: Progressing Goal: RH STG MANAGE BLADDER WITH MEDICATION WITH ASSISTANCE Description: STG Manage Bladder With Medication With Supervision Assistance. 04/21/2021 1557 by Renda Rolls L, LPN Outcome:  Progressing 04/21/2021 1556 by Sanda Linger, LPN Outcome: Progressing   Problem: RH SKIN INTEGRITY Goal: RH STG ABLE TO PERFORM INCISION/WOUND CARE W/ASSISTANCE Description: STG Able To Perform Incision/Wound Care With Supervision Assistance. 04/21/2021 1557 by Renda Rolls L, LPN Outcome: Progressing 04/21/2021 1556 by Renda Rolls L, LPN Outcome: Progressing   Problem: RH SAFETY Goal: RH STG ADHERE TO SAFETY PRECAUTIONS W/ASSISTANCE/DEVICE Description: STG Adhere to Safety Precautions With Cues and Reminders. 04/21/2021 1557 by Renda Rolls L, LPN Outcome: Progressing 04/21/2021 1556 by Renda Rolls L, LPN Outcome: Progressing Goal: RH STG DECREASED RISK OF FALL WITH ASSISTANCE Description: STG Decreased Risk of Fall With Cues and Reminders. 04/21/2021 1557 by Renda Rolls L, LPN Outcome: Progressing 04/21/2021 1556 by Renda Rolls L, LPN Outcome: Progressing   Problem: RH COGNITION-NURSING Goal: RH STG USES MEMORY AIDS/STRATEGIES W/ASSIST TO PROBLEM SOLVE Description: STG Uses Memory Aids/Strategies With Cues and Reminders to Problem Solve. 04/21/2021 1557 by Renda Rolls L, LPN Outcome: Progressing 04/21/2021 1556 by Sanda Linger, LPN Outcome: Progressing Goal: RH STG ANTICIPATES NEEDS/CALLS FOR ASSIST W/ASSIST/CUES Description: STG Anticipates Needs/Calls for Assist With Cues and Reminders. 04/21/2021 1557 by Renda Rolls L, LPN Outcome: Progressing 04/21/2021 1556 by Renda Rolls L, LPN Outcome: Progressing   Problem: RH PAIN MANAGEMENT Goal: RH STG PAIN MANAGED AT OR BELOW PT'S PAIN GOAL Description: < 3 on a 0-10 pain scale. 04/21/2021 1557 by Renda Rolls L, LPN Outcome: Progressing 04/21/2021 1556 by Sanda Linger, LPN Outcome: Progressing   Problem: RH KNOWLEDGE DEFICIT BRAIN INJURY Goal: RH STG INCREASE KNOWLEDGE OF SELF CARE AFTER BRAIN INJURY Description: Patient will demonstrate knowledge of medication management, pain management, bowel/bladder management,  skin/wound care with educational materials and handouts provided by staff independently at discharge. 04/21/2021 1557 by Sanda Linger, LPN Outcome: Progressing 04/21/2021 1556 by Sanda Linger, LPN Outcome: Progressing

## 2021-04-22 LAB — GLUCOSE, CAPILLARY
Glucose-Capillary: 91 mg/dL (ref 70–99)
Glucose-Capillary: 270 mg/dL — ABNORMAL HIGH (ref 70–99)
Glucose-Capillary: 258 mg/dL — ABNORMAL HIGH (ref 70–99)
Glucose-Capillary: 131 mg/dL — ABNORMAL HIGH (ref 70–99)
Glucose-Capillary: 240 mg/dL — ABNORMAL HIGH (ref 70–99)

## 2021-04-22 MED ORDER — AMITRIPTYLINE HCL 25 MG PO TABS
50.0000 mg | ORAL_TABLET | Freq: Every day | ORAL | Status: DC
  Administered 2021-04-22 – 2021-04-27 (×6): 50 mg via ORAL
  Filled 2021-04-22 (×6): qty 2

## 2021-04-22 MED ORDER — MAGNESIUM CITRATE PO SOLN
1.0000 | Freq: Once | ORAL | Status: AC
  Administered 2021-04-22: 1 via ORAL
  Filled 2021-04-22: qty 296

## 2021-04-22 MED ORDER — METHOCARBAMOL 750 MG PO TABS
750.0000 mg | ORAL_TABLET | Freq: Three times a day (TID) | ORAL | Status: DC
  Administered 2021-04-22 – 2021-04-23 (×3): 750 mg via ORAL
  Filled 2021-04-22 (×3): qty 1

## 2021-04-22 MED ORDER — POLYETHYLENE GLYCOL 3350 17 G PO PACK
17.0000 g | PACK | Freq: Every day | ORAL | Status: DC
  Administered 2021-04-23 – 2021-04-24 (×2): 17 g via ORAL
  Filled 2021-04-22 (×2): qty 1
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
## 2021-04-22 NOTE — Progress Notes (Signed)
Occupational Therapy TBI Note  Patient Details  Name: [name] MRN: [id number] Date of Birth: June 24, 1941  Today's Date: 04/22/2021 OT Individual Time: 0900-1000 OT Individual Time Calculation (min): 60 min    Short Term Goals:  Week 3:  OT Short Term Goal 1 (Week 3): STG=LTG d/t ELOS  Skilled Therapeutic Interventions/Progress Updates:    Pt received sleeping, easily awoken and agreeable to OT session. Pt demonstrated STM deficits- stating "I've only seen a doctor 3 times since being here" despite reorientation and discussion re daily MD visits and notes pt still resistant. Pt completed sit > stand from elevated EOB with CGA x2 attempts using RW. He completed ambulatory transfer into the bathroom with CGA. Min A for clothing management during toileting tasks. Unsuccessful BM void attempt. Pt required min A to pull up pants in standing d/t standing balance deficits when removing UE from the RW. Pt returned to his w/c and completed oral care and grooming tasks at the sink. He was taken to the therapy gym where focused was placed on standing balance with alternating UE removal to simulate LB dressing/toileting tasks. Pt was able to reach forward and posteriorly, alternating UE removal with only CGA when cued for increasing BOS and increasing confidence with each subsequent trial. Pt returned to his room and was left supine with all needs met. 2 unknown pills were found in pt's bed and directly given to pt's RN Tomeka. Also discussed constipation and last known BM 6/17. Bed alarm set.   Therapy Documentation Precautions:  Precautions Precautions: Fall, Other (comment) Precaution Comments: Sinus precautions, may not blow nose, may not use straw Restrictions Weight Bearing Restrictions: No Other Position/Activity Restrictions: see above, sinus precuations  Agitated Behavior Scale: TBI Observation Details Observation Environment: CIR Start of observation period - Date: 04/22/21 Start of  observation period - Time: 0900 End of observation period - Date: 04/22/21 End of observation period - Time: 1000 Agitated Behavior Scale (DO NOT LEAVE BLANKS) Short attention span, easy distractibility, inability to concentrate: Absent Impulsive, impatient, low tolerance for pain or frustration: Absent Uncooperative, resistant to care, demanding: Absent Violent and/or threatening violence toward people or property: Absent Explosive and/or unpredictable anger: Absent Rocking, rubbing, moaning, or other self-stimulating behavior: Absent Pulling at tubes, restraints, etc.: Absent Wandering from treatment areas: Absent Restlessness, pacing, excessive movement: Absent Repetitive behaviors, motor, and/or verbal: Absent Rapid, loud, or excessive talking: Absent Sudden changes of mood: Absent Easily initiated or excessive crying and/or laughter: Absent Self-abusiveness, physical and/or verbal: Absent Agitated behavior scale total score: 14   Therapy/Group: Individual Therapy  [name] 04/22/2021, 12:34 PM

## 2021-04-22 NOTE — Plan of Care (Signed)
  Problem: Consults Goal: RH BRAIN INJURY PATIENT EDUCATION Description: Description: See Patient Education module for eduction specifics Outcome: Progressing Goal: Skin Care Protocol Initiated - if Braden Score 18 or less Description: If consults are not indicated, leave blank or document N/A Outcome: Progressing Goal: Nutrition Consult-if indicated Outcome: Progressing Goal: Diabetes Guidelines if Diabetic/Glucose > 140 Description: If diabetic or lab glucose is > 140 mg/dl - Initiate Diabetes/Hyperglycemia Guidelines & Document Interventions  Outcome: Progressing   Problem: RH BOWEL ELIMINATION Goal: RH STG MANAGE BOWEL WITH ASSISTANCE Description: STG Manage Bowel with Supervision Assistance. Outcome: Progressing Goal: RH STG MANAGE BOWEL W/MEDICATION W/ASSISTANCE Description: STG Manage Bowel with Medication with Supervision Assistance. Outcome: Progressing   Problem: RH BLADDER ELIMINATION Goal: RH STG MANAGE BLADDER WITH ASSISTANCE Description: STG Manage Bladder With Supervision Assistance Outcome: Progressing Goal: RH STG MANAGE BLADDER WITH MEDICATION WITH ASSISTANCE Description: STG Manage Bladder With Medication With Supervision Assistance. Outcome: Progressing   Problem: RH SKIN INTEGRITY Goal: RH STG ABLE TO PERFORM INCISION/WOUND CARE W/ASSISTANCE Description: STG Able To Perform Incision/Wound Care With Supervision Assistance. Outcome: Progressing   Problem: RH SAFETY Goal: RH STG ADHERE TO SAFETY PRECAUTIONS W/ASSISTANCE/DEVICE Description: STG Adhere to Safety Precautions With Cues and Reminders. Outcome: Progressing Goal: RH STG DECREASED RISK OF FALL WITH ASSISTANCE Description: STG Decreased Risk of Fall With Cues and Reminders. Outcome: Progressing   Problem: RH COGNITION-NURSING Goal: RH STG USES MEMORY AIDS/STRATEGIES W/ASSIST TO PROBLEM SOLVE Description: STG Uses Memory Aids/Strategies With Cues and Reminders to Problem Solve. Outcome:  Progressing Goal: RH STG ANTICIPATES NEEDS/CALLS FOR ASSIST W/ASSIST/CUES Description: STG Anticipates Needs/Calls for Assist With Cues and Reminders. Outcome: Progressing   Problem: RH PAIN MANAGEMENT Goal: RH STG PAIN MANAGED AT OR BELOW PT'S PAIN GOAL Description: < 3 on a 0-10 pain scale. Outcome: Progressing   Problem: RH KNOWLEDGE DEFICIT BRAIN INJURY Goal: RH STG INCREASE KNOWLEDGE OF SELF CARE AFTER BRAIN INJURY Description: Patient will demonstrate knowledge of medication management, pain management, bowel/bladder management, skin/wound care with educational materials and handouts provided by staff independently at discharge. Outcome: Progressing
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
## 2021-04-22 NOTE — Progress Notes (Signed)
Requested by patient's nurse to give enema. PRN fleets enema given, no results. Disimpacted for large #1 type stool. More hard stool felt in rectum, patient requested to wait until later. Reports feeling a lot better. Paged. Dr. Adam Phenix to add scheduled bowel meds. Patrici Ranks A

## 2021-04-22 NOTE — Progress Notes (Addendum)
Physical Therapy Weekly Progress Note  Patient Details  Name: [name] MRN: [id number] Date of Birth: May 31, 1941  Beginning of progress report period: April 15, 2021 End of progress report period: April 22, 2021  Today's Date: 04/22/2021 PT Individual Time: 1500-1540 PT Individual Time Calculation (min): 40 min   Patient has met 3 of 4 short term goals.  Patient with slow, but steady progress this week, limited by increased fatigue secondary to poor sleep due to B foot pain and headaches. Noted patient to have improved awareness of his deficits that may be leading to limitations by fatigue/pain. Patient had a fall to his knees this week, patient reports that this type of fall has happened at baseline. He currently requires CGA overall with mobility, ambulating up to 267 feet using RW. Current focus on functional mobility, family education, fall prevention and recovery, and energy conservation techniques.   Patient continues to demonstrate the following deficits muscle weakness and muscle joint tightness, decreased cardiorespiratoy endurance, central origin, and decreased standing balance, decreased postural control, and decreased balance strategies and therefore will continue to benefit from skilled PT intervention to increase functional independence with mobility.  Patient progressing toward long term goals..  Continue plan of care.  PT Short Term Goals Week 2:  PT Short Term Goal 1 (Week 2): Patient will perform basic transfers with CGA-supervision using LRAD. PT Short Term Goal 1 - Progress (Week 2): Met PT Short Term Goal 2 (Week 2): Patient will ambulate >150 ft with CGA using LRAD. PT Short Term Goal 2 - Progress (Week 2): Met PT Short Term Goal 3 (Week 2): Patient will ambulate over at least 10 ft of unlevel surfaces with min A using LRAD. PT Short Term Goal 3 - Progress (Week 2): Progressing toward goal PT Short Term Goal 4 (Week 2): Patient will perform standing balanc e with single  upper extremtiy support with supervision during a functional task >5 min. PT Short Term Goal 4 - Progress (Week 2): Met Week 3:  PT Short Term Goal 1 (Week 3): STG=LTG due to ELOS.  Skilled Therapeutic Interventions/Progress Updates:     Patient in bed with his wife and RN in the room upon PT arrival. Patient alert and agreeable to PT session. Patient reported 6/10 b foot pain R>L during session, RN made aware. PT provided repositioning, rest breaks, and distraction as pain interventions throughout session.   Therapeutic Activity: Bed Mobility: Patient performed supine to sit independently in a flat bed without use of bed rails. Donned shoes sitting EOB with total A for energy/time management. Transfers: Patient performed sit to/from stand x3 with CGA using RW, mild posterior lean with initial stand and patient able to self correct without additional assist. Provided verbal cues for forward weight shift with planting forefoot into the floor.  Gait Training:  Patient ambulated 267 feet using RW with CGA. Ambulated with R lateral knee thrust, flexed posture, downward head gaze, decreased B DF, decreased gastroc activation in push-of, and B trendelenburg gait. Provided verbal cues for erect posture, visual scanning of environment, proximity to RW, and increased propulsion and gait speed with facilitation of forward progression of RW. Patient ambulated up a ramp, over 5 feet of mulch (unlevel surface), and down a curb to simulate community ambulation over unlevel surfaces with min A using RW and poor safety awareness on mulch and with AD on curb. Provided max multimodal cues for technique and use of AD. Noted R ankle inversion in stance on mulch with  patient unaware of foot positioning when asked.  Spent increased time discussing d/c planning, TBI pathology, symptoms, and recovery, return to exercise, return to driving, and use of RW vs rollator at D/c. PT recommending RW for safety at this time patient  and his wife in agreement.   Patient in w/c with is wife in the room and nursing staff alerted to patient requesting assistance to the bathroom at end of session with breaks locked and all needs within reach.   Therapy Documentation Precautions:  Precautions Precautions: Fall, Other (comment) Precaution Comments: Sinus precautions, may not blow nose, may not use straw Restrictions Weight Bearing Restrictions: No Other Position/Activity Restrictions: see above, sinus precuations  Agitated Behavior Scale: TBI Observation Details Observation Environment: CIR Start of observation period - Date: 04/22/21 Start of observation period - Time: 0900 End of observation period - Date: 04/22/21 End of observation period - Time: 1000 Agitated Behavior Scale (DO NOT LEAVE BLANKS) Short attention span, easy distractibility, inability to concentrate: Absent Impulsive, impatient, low tolerance for pain or frustration: Absent Uncooperative, resistant to care, demanding: Absent Violent and/or threatening violence toward people or property: Absent Explosive and/or unpredictable anger: Absent Rocking, rubbing, moaning, or other self-stimulating behavior: Absent Pulling at tubes, restraints, etc.: Absent Wandering from treatment areas: Absent Restlessness, pacing, excessive movement: Absent Repetitive behaviors, motor, and/or verbal: Absent Rapid, loud, or excessive talking: Absent Sudden changes of mood: Absent Easily initiated or excessive crying and/or laughter: Absent Self-abusiveness, physical and/or verbal: Absent Agitated behavior scale total score: 14  Therapy/Group: Individual Therapy  [name] L Neiko Trivedi PT, DPT  04/22/2021, 4:46 PM

## 2021-04-23 DIAGNOSIS — M792 Neuralgia and neuritis, unspecified: Secondary | ICD-10-CM

## 2021-04-23 LAB — GLUCOSE, CAPILLARY
Glucose-Capillary: 132 mg/dL — ABNORMAL HIGH (ref 70–99)
Glucose-Capillary: 268 mg/dL — ABNORMAL HIGH (ref 70–99)
Glucose-Capillary: 160 mg/dL — ABNORMAL HIGH (ref 70–99)
Glucose-Capillary: 262 mg/dL — ABNORMAL HIGH (ref 70–99)

## 2021-04-23 MED ORDER — GLIMEPIRIDE 2 MG PO TABS
6.0000 mg | ORAL_TABLET | Freq: Every day | ORAL | Status: DC
  Administered 2021-04-24 – 2021-04-28 (×5): 6 mg via ORAL
  Filled 2021-04-23 (×5): qty 3

## 2021-04-23 MED ORDER — METHOCARBAMOL 500 MG PO TABS
500.0000 mg | ORAL_TABLET | Freq: Three times a day (TID) | ORAL | Status: DC
  Administered 2021-04-23 – 2021-04-28 (×15): 500 mg via ORAL
  Filled 2021-04-23 (×15): qty 1

## 2021-04-23 NOTE — Progress Notes (Signed)
Occupational Therapy TBI Note  Patient Details  Name: [name] MRN: [id number] Date of Birth: [birth year]  Today's Date: 04/23/2021 OT Individual Time: 3818-2993 OT Individual Time Calculation (min): 55 min    Short Term Goals: Week 1:  OT Short Term Goal 1 (Week 1): Pt will thread BLE into pants OT Short Term Goal 2 (Week 1): Pt will sequence bathing body parts with no more than min cuing OT Short Term Goal 2 - Progress (Week 1): Met OT Short Term Goal 3 (Week 1): Pt will groom wiht no VC OT Short Term Goal 3 - Progress (Week 1): Met OT Short Term Goal 4 (Week 1): Pt will trnasfer to toilet/BSC with MOD A OT Short Term Goal 4 - Progress (Week 1): Met  Skilled Therapeutic Interventions/Progress Updates:    Pt received in room walking back to bed with NT after toileting and consented to OT tx. Pt's wife st bedside. Pt reported he was exhausted and preferred to completed OT session in room this day. Pt seen for instruction and training in BUE strengthening HEP to increase strength and activity tolerance for ADLs. Pt instructed in unilateral exercises including bicep curl, shoulder press, chest press, shoulder abduction and shoulder flexion for 3x10 with min cuing for proper tech with good carryover. Pt required frequent rest breaks 2/2 fatigue. Pt then instructed in standing balance exercises, instructed to stand from EOB to RW with CGA and step up on blue balance pad. Pt req CGA-min A with heavy reliance on BUEs to maintain balance standing on blue balance pad with cues to look up. Pt stood for 5-10 seconds at a time before requiring to step back down and take a seat, completed 3x total. After tx, pt left sitting EOB with alarm on and wife at bedside, all needs met.  Therapy Documentation Precautions:  Precautions Precautions: Fall, Other (comment) Precaution Comments: Sinus precautions, may not blow nose, may not use straw Restrictions Weight Bearing Restrictions: No Other  Position/Activity Restrictions: see above, sinus precuations  Pain:   None  Agitated Behavior Scale: TBI Observation Details Observation Environment: patient room Start of observation period - Date: 04/23/21 Start of observation period - Time: 1103 End of observation period - Date: 04/23/21 End of observation period - Time: 1158 Agitated Behavior Scale (DO NOT LEAVE BLANKS) Short attention span, easy distractibility, inability to concentrate: Absent Impulsive, impatient, low tolerance for pain or frustration: Absent Uncooperative, resistant to care, demanding: Absent Violent and/or threatening violence toward people or property: Absent Explosive and/or unpredictable anger: Absent Rocking, rubbing, moaning, or other self-stimulating behavior: Absent Pulling at tubes, restraints, etc.: Absent Wandering from treatment areas: Absent Restlessness, pacing, excessive movement: Absent Repetitive behaviors, motor, and/or verbal: Absent Rapid, loud, or excessive talking: Absent Sudden changes of mood: Absent Easily initiated or excessive crying and/or laughter: Absent Self-abusiveness, physical and/or verbal: Absent Agitated behavior scale total score: 14    Therapy/Group: Individual Therapy  [name] 04/23/2021, 12:07 PM

## 2021-04-23 NOTE — Progress Notes (Signed)
Occupational Therapy TBI Note  Patient Details  Name: [name] MRN: [id number] Date of Birth: Oct 24, 1941  Today's Date: 04/23/2021 OT Individual Time: 1302-1400 OT Individual Time Calculation (min): 58 min    Short Term Goals: Week 3:  OT Short Term Goal 1 (Week 3): STG=LTG d/t ELOS  Skilled Therapeutic Interventions/Progress Updates:   Pt greeted asleep in bed with spouse present. Pt easy to wake and came to sitting EOB with increased time and supervision. Pt declined need to go to the bathroom or perform any BADL tasks this afternoon. Worked on sitting balance unsupported at EOB while eating lunch. Worked on fine motor control to open containers and had pt read out food items and locate them on tray. After eating, pt completed stand-pivot to wc CGA. Pt completed 10 mins on [employer] arm bike for UB strength/endurance. Memory activity using BITS with pt able to get up to 5 words in a row. Pt returned to room and handoff to SLP for next therapy session.    Therapy Documentation Precautions:  Precautions Precautions: Fall, Other (comment) Precaution Comments: Sinus precautions, may not blow nose, may not use straw Restrictions Weight Bearing Restrictions: No Other Position/Activity Restrictions: see above, sinus precuations Pain:   Denies pain Agitated Behavior Scale: TBI Observation Details Observation Environment: CIR Start of observation period - Date: 04/23/21 Start of observation period - Time: 1300 End of observation period - Date: 04/23/21 End of observation period - Time: 1400 Agitated Behavior Scale (DO NOT LEAVE BLANKS) Short attention span, easy distractibility, inability to concentrate: Absent Impulsive, impatient, low tolerance for pain or frustration: Absent Uncooperative, resistant to care, demanding: Absent Violent and/or threatening violence toward people or property: Absent Explosive and/or unpredictable anger: Absent Rocking, rubbing, moaning, or other  self-stimulating behavior: Absent Pulling at tubes, restraints, etc.: Absent Wandering from treatment areas: Absent Restlessness, pacing, excessive movement: Absent Repetitive behaviors, motor, and/or verbal: Absent Rapid, loud, or excessive talking: Absent Sudden changes of mood: Absent Easily initiated or excessive crying and/or laughter: Absent Self-abusiveness, physical and/or verbal: Absent Agitated behavior scale total score: 14    Therapy/Group: Individual Therapy  Valma Cava 04/23/2021, 2:05 PM

## 2021-04-23 NOTE — Progress Notes (Signed)
Physical Therapy TBI Note  Patient Details  Name: [name] MRN: [id number] Date of Birth: [birth year]  Today's Date: 04/23/2021 PT Individual Time: 1000-1058 PT Individual Time Calculation (min): 58 min   Short Term Goals: Week 3:  PT Short Term Goal 1 (Week 3): STG=LTG due to ELOS.  Skilled Therapeutic Interventions/Progress Updates:     Patient sitting EOB eating cereal with his wife in the room upon PT arrival. Patient alert and agreeable to PT session. Patient reported 3/10 rectal pain from disimpaction yesterday during session, RN made aware. PT provided repositioning, rest breaks, and distraction as pain interventions throughout session. Patient reported significant fatigue and weakness today due to lack of sleep overnight. Reports bowl disimpaction and frequent toileting kept him awake all night.  Patient requested to go outside, as he has not been outside since admission. Focused session on therapeutic activity outside at the [facility] and patient/caregiver education.  Provided education on use of the transport chair for energy conservation and RW for ambulation when patient is feeling well rested to avoid falls at home. Recommended household distances for gait at d/c, up to 50 ft, with RW and gait belt for safety. Recommended use of transport chair or facility w/c for community access at this time. Discussed use of gait belt for steadying and controlled descent in event of a fall with patient's wife trialing with PT wearing gait belt. Patient and his wife very focused on returning to regular activities and travel. Provided education on focusing on patient's rehab and recovery by setting acheivable goals for the patient to return to these activities. Also, educated on safe travel with disability to assist with making future plans.   Therapeutic Activity: Donned pants with mod A from his wife for threading and pulling up in standing. Donned B socks and tennis shoes with total A for  energy/time management.  Transfers: Patient performed sit to/from stand x2 and stand pivot bed>w/c with CGA using RW. Changed out RW due to RW rocking despite being set to a level height. Provided verbal cues for forward weight shift each trial with poor carryover this session. Patient performed ambulatory transfer to the bathroom at end of session with CGA using RW. Educated on safe gait speed despite bowl urgency, as patient had a brief donned due to increased bowl meds.  Wheelchair Mobility:  Patient propelled wheelchair 55 feet using B lower extremities to simulate independent mobility in a transport chair on unlevel pavement with supervision and min A on slight downhill slope for safety. Provided verbal cues for technique and use of w/c parts.   Patient sitting on toilet with his wife in the bathroom agreeable to pull the cord to call NT in to assist him back when he is ready, NT made aware, at end of session.   Therapy Documentation Precautions:  Precautions Precautions: Fall, Other (comment) Precaution Comments: Sinus precautions, may not blow nose, may not use straw Restrictions Weight Bearing Restrictions: No Other Position/Activity Restrictions: see above, sinus precuations  Agitated Behavior Scale: TBI Observation Details Observation Environment: CIR/[facility] entrance Start of observation period - Date: 04/23/21 Start of observation period - Time: 1000 End of observation period - Date: 04/23/21 End of observation period - Time: 1058 Agitated Behavior Scale (DO NOT LEAVE BLANKS) Short attention span, easy distractibility, inability to concentrate: Absent Impulsive, impatient, low tolerance for pain or frustration: Absent Uncooperative, resistant to care, demanding: Absent Violent and/or threatening violence toward people or property: Absent Explosive and/or unpredictable anger: Absent Rocking, rubbing,  moaning, or other self-stimulating behavior: Absent Pulling at tubes,  restraints, etc.: Absent Wandering from treatment areas: Absent Restlessness, pacing, excessive movement: Absent Repetitive behaviors, motor, and/or verbal: Absent Rapid, loud, or excessive talking: Absent Sudden changes of mood: Absent Easily initiated or excessive crying and/or laughter: Absent Self-abusiveness, physical and/or verbal: Absent Agitated behavior scale total score: 14     Therapy/Group: Individual Therapy  Spyridon Hornstein L Krystian Younglove PT, DPT  04/23/2021, 11:40 AM

## 2021-04-23 NOTE — Progress Notes (Addendum)
Speech Language Pathology TBI Note  Patient Details  Name: [name] MRN: [id number] Date of Birth: 1941-01-09  Today's Date: 04/23/2021 SLP Individual Time: 1400-1425 SLP Individual Time Calculation (min): 25 min  Short Term Goals: Week 3: SLP Short Term Goal 1 (Week 3): STG = LTG due to ELOS  Skilled Therapeutic Interventions: Skilled treatment session focused on cognitive goals. Patient was independently oriented X 4 and independently recalled the events from the previous therapy session. Patient reported that he urgently needed to use the bathroom due to needing a laxative secondary to constipation. Patient was continent of bowel and required Mod verbal cues for safety with transfer while utilizing the RW, suspect due to urgency. Patient requested to get to bed at end of session and required Min verbal cues for safety. Patient left upright in bed with alarm on and all needs within reach. Continue with current plan of care.      Pain No/Denies Pain   Agitated Behavior Scale (DO NOT LEAVE BLANKS) Short attention span, easy distractibility, inability to concentrate: Absent Impulsive, impatient, low tolerance for pain or frustration: Absent Uncooperative, resistant to care, demanding: Absent Violent and/or threatening violence toward people or property: Absent Explosive and/or unpredictable anger: Absent Rocking, rubbing, moaning, or other self-stimulating behavior: Absent Pulling at tubes, restraints, etc.: Absent Wandering from treatment areas: Absent Restlessness, pacing, excessive movement: Absent Repetitive behaviors, motor, and/or verbal: Absent Rapid, loud, or excessive talking: Absent Sudden changes of mood: Absent Easily initiated or excessive crying and/or laughter: Absent Self-abusiveness, physical and/or verbal: Absent Agitated behavior scale total score: 14  Therapy/Group: Individual Therapy  Mahalie Kanner 04/23/2021, 3:00 PM
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
## 2021-04-23 NOTE — Plan of Care (Signed)
  Problem: Consults Goal: RH BRAIN INJURY PATIENT EDUCATION Description: Description: See Patient Education module for eduction specifics Outcome: Progressing Goal: Skin Care Protocol Initiated - if Braden Score 18 or less Description: If consults are not indicated, leave blank or document N/A Outcome: Progressing Goal: Nutrition Consult-if indicated Outcome: Progressing Goal: Diabetes Guidelines if Diabetic/Glucose > 140 Description: If diabetic or lab glucose is > 140 mg/dl - Initiate Diabetes/Hyperglycemia Guidelines & Document Interventions  Outcome: Progressing   Problem: RH BOWEL ELIMINATION Goal: RH STG MANAGE BOWEL WITH ASSISTANCE Description: STG Manage Bowel with Supervision Assistance. Outcome: Progressing Goal: RH STG MANAGE BOWEL W/MEDICATION W/ASSISTANCE Description: STG Manage Bowel with Medication with Supervision Assistance. Outcome: Progressing   Problem: RH BLADDER ELIMINATION Goal: RH STG MANAGE BLADDER WITH ASSISTANCE Description: STG Manage Bladder With Supervision Assistance Outcome: Progressing Goal: RH STG MANAGE BLADDER WITH MEDICATION WITH ASSISTANCE Description: STG Manage Bladder With Medication With Supervision Assistance. Outcome: Progressing   Problem: RH SKIN INTEGRITY Goal: RH STG ABLE TO PERFORM INCISION/WOUND CARE W/ASSISTANCE Description: STG Able To Perform Incision/Wound Care With Supervision Assistance. Outcome: Progressing   Problem: RH SAFETY Goal: RH STG ADHERE TO SAFETY PRECAUTIONS W/ASSISTANCE/DEVICE Description: STG Adhere to Safety Precautions With Cues and Reminders. Outcome: Progressing Goal: RH STG DECREASED RISK OF FALL WITH ASSISTANCE Description: STG Decreased Risk of Fall With Cues and Reminders. Outcome: Progressing   Problem: RH COGNITION-NURSING Goal: RH STG USES MEMORY AIDS/STRATEGIES W/ASSIST TO PROBLEM SOLVE Description: STG Uses Memory Aids/Strategies With Cues and Reminders to Problem Solve. Outcome:  Progressing Goal: RH STG ANTICIPATES NEEDS/CALLS FOR ASSIST W/ASSIST/CUES Description: STG Anticipates Needs/Calls for Assist With Cues and Reminders. Outcome: Progressing   Problem: RH PAIN MANAGEMENT Goal: RH STG PAIN MANAGED AT OR BELOW PT'S PAIN GOAL Description: < 3 on a 0-10 pain scale. Outcome: Progressing   Problem: RH KNOWLEDGE DEFICIT BRAIN INJURY Goal: RH STG INCREASE KNOWLEDGE OF SELF CARE AFTER BRAIN INJURY Description: Patient will demonstrate knowledge of medication management, pain management, bowel/bladder management, skin/wound care with educational materials and handouts provided by staff independently at discharge. Outcome: Progressing

## 2021-04-24 LAB — GLUCOSE, CAPILLARY
Glucose-Capillary: 155 mg/dL — ABNORMAL HIGH (ref 70–99)
Glucose-Capillary: 132 mg/dL — ABNORMAL HIGH (ref 70–99)
Glucose-Capillary: 174 mg/dL — ABNORMAL HIGH (ref 70–99)
Glucose-Capillary: 165 mg/dL — ABNORMAL HIGH (ref 70–99)

## 2021-04-24 MED ORDER — SENNOSIDES-DOCUSATE SODIUM 8.6-50 MG PO TABS
1.0000 | ORAL_TABLET | Freq: Every day | ORAL | Status: DC
  Administered 2021-04-24 – 2021-04-27 (×4): 1 via ORAL
  Filled 2021-04-24 (×4): qty 1

## 2021-04-24 MED ORDER — SACCHAROMYCES BOULARDII 250 MG PO CAPS
250.0000 mg | ORAL_CAPSULE | Freq: Two times a day (BID) | ORAL | Status: DC
  Administered 2021-04-24 – 2021-04-28 (×8): 250 mg via ORAL
  Filled 2021-04-24 (×8): qty 1
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
## 2021-04-24 NOTE — Progress Notes (Signed)
Patient ID: [name], male   DOB: 1941/07/30, 80 y.o.   MRN: [id number]  SW met with pt and pt wife in room to discuss updates from team conference, and no changes in d/c date. Pt husband remained sleep during visit. SW asked about if made copayment for DME. Pt wife stated that she made the copayment today. SW informed there were no further updates at this time.   Loralee Pacas, MSW, [place] Office: [phone] Cell: [phone] Fax: [phone]

## 2021-04-24 NOTE — Progress Notes (Signed)
Physical Therapy TBI Note  Patient Details  Name: [name] MRN: [id number] Date of Birth: 07-03-1941  Today's Date: 04/24/2021 PT Individual Time: 0867-6195 PT Individual Time Calculation (min): 55 min   Short Term Goals: Week 3:  PT Short Term Goal 1 (Week 3): STG=LTG due to ELOS.  Skilled Therapeutic Interventions/Progress Updates:     Patient in bed asleep and snoring upon PT arrival. Patient did not arouse to multiple attempts of verbal or tactile stimulation, finally aroused to PT shaking patient's shoulder. Patient reported 5-6/10 neck and head "pressure" during session, RN made aware. PT provided repositioning, rest breaks, and distraction as pain interventions throughout session.   Orthostatic Vitals: Sitting: BP 101/59, HR 86 (symptomatic) Sitting x2 min: BP 91/57, HR 92 (symptomatic) Standing: BP 87/58, HR 100 (symptomatic) Supine after lying with LEs elevated x5 min: BP 112/68, HR 82 (asymptomatic) Sitting: BP 85/54, HR 91 (symptomatic)  Patient Performed supine to sit with supervision in a flat bed without use of bed rails. Patient reported feeling "dizzy" upon sitting EOB, no visible nystagmus noted with change in position, vitals as above. Patient sat >2 min as PT donned B tennis shoes with total A and without improvement in symptoms and worsening of symptoms when in standing, performed with CGA using RW. Due to low BP and patient reporting "horrible dizziness" patient returned to sitting then supine with supervision and PT assisted with elevating his lower extremities with HOB flat and knees and feet raised.   Donned B TED hose and non-skid socks while patient laid in supine >5 min, BP as above. Returned to sitting with min A for trunk support due to increased dizziness in sitting and patient requesting to return to lying in the bed. Discussed vitals with LPN who reports that the patient has been sleeping most of the day.   Educated patient on orthostatic hypotension.  Patient reports drinking 2 cartons of milk today and nothing else. Patient requested more mild. Provided patient with non-fat milk and ice water and encouraged intake of fluids and maintaining arousal. Patient very appreciative and drank sips of both the milk and water while PT in the room. Discussed d/c planning and home safety. Educate don not getting OOB in the event of symptoms in sitting or standing and encouraged him to wait 30-60 sec in each position before progressing to ensure symptoms do not develop. Also, educated on the importance of drinking fluids to maintain sufficient BP. Patient stated understanding and in agreement. Patient in bed with both drinks in front of him with breaks locked, bed alarm set, and all needs in reach.    Therapy Documentation Precautions:  Precautions Precautions: Fall, Other (comment) Precaution Comments: Sinus precautions, may not blow nose, may not use straw Restrictions Weight Bearing Restrictions: No Other Position/Activity Restrictions: see above, sinus precuations Agitated Behavior Scale: TBI Observation Details Observation Environment: Patient's room Start of observation period - Date: 04/24/21 Start of observation period - Time: 1300 End of observation period - Date: 04/24/21 End of observation period - Time: 1355 Agitated Behavior Scale (DO NOT LEAVE BLANKS) Short attention span, easy distractibility, inability to concentrate: Absent Impulsive, impatient, low tolerance for pain or frustration: Absent Uncooperative, resistant to care, demanding: Absent Violent and/or threatening violence toward people or property: Absent Explosive and/or unpredictable anger: Absent Rocking, rubbing, moaning, or other self-stimulating behavior: Absent Pulling at tubes, restraints, etc.: Absent Wandering from treatment areas: Absent Restlessness, pacing, excessive movement: Absent Repetitive behaviors, motor, and/or verbal: Absent Rapid, loud, or excessive  talking: Absent Sudden changes of mood: Absent Easily initiated or excessive crying and/or laughter: Absent Self-abusiveness, physical and/or verbal: Absent Agitated behavior scale total score: 14    Therapy/Group: Individual Therapy  Juliett Eastburn L Jesiah Yerby PT, DPT  04/24/2021, 2:08 PM

## 2021-04-24 NOTE — Progress Notes (Signed)
Occupational Therapy Session Note  Patient Details  Name: [name] MRN: [id number] Date of Birth: 1941-08-07  Today's Date: 04/24/2021 OT Individual Time: 1430-1530 OT Individual Time Calculation (min): 60 min    Short Term Goals:  Week 2:  OT Short Term Goal 1 (Week 2): Pt will consistently transfer to bathroom with LRAD and CGA OT Short Term Goal 1 - Progress (Week 2): Met OT Short Term Goal 2 (Week 2): Pt will groom in standingw iht CGA OT Short Term Goal 2 - Progress (Week 2): Progressing toward goal OT Short Term Goal 3 (Week 2): Pt will bathe sit to stand wiht CGA OT Short Term Goal 3 - Progress (Week 2): Met OT Short Term Goal 4 (Week 2): Pt wil don footwear wiht MIN A OT Short Term Goal 4 - Progress (Week 2): Met  Skilled Therapeutic Interventions/Progress Updates:    Pt received in bed and consented to OT tx. Pt sat EOB and c/o feeling dizzy, BP taken and was 118/64. Pt reported dizziness subsided, doffed socks with SUP and donned tennis shoes with min A. Pt then transferred from EOB to w/c with CGA and no device for squat pivot. Pt taken down to gym and instructed in standing tolerance activity. Pt instructed to stand at elevated table to complete puzzle, stood for >5 mins x3 with seated rest breaks in between due to fatigue. Pt required cuing for proper posture and body mechanics during STS/standing activity. Pt then requested to do something different as he was becoming frustrated with the puzzle. Instructed in 4# dowel rod BUE strengthening exercises to increase strength and activity tolerance for ADLs. Pt instructed in chest press, shoulder pres, shoulder flexion, and elbow flexion for 3x15 with min cuing for proper tech with good carryover. Pt req rest breaks in between sets 2/2 fatigue. After tx, pt helped back to bed and left with alarm on and all needs met, wife present at bedside.   Therapy Documentation Precautions:  Precautions Precautions: Fall, Other  (comment) Precaution Comments: Sinus precautions, may not blow nose, may not use straw Restrictions Weight Bearing Restrictions: No Other Position/Activity Restrictions: see above, sinus precuations    Pain:    none    Therapy/Group: Individual Therapy  Jeromie Gainor 04/24/2021, 3:07 PM

## 2021-04-24 NOTE — Progress Notes (Signed)
Speech Language Pathology TBI Note  Patient Details  Name: [name] MRN: [id number] Date of Birth: 02/20/1941  Today's Date: 04/24/2021 SLP Individual Time: 1400-1430 SLP Individual Time Calculation (min): 30 min  Short Term Goals: Week 3: SLP Short Term Goal 1 (Week 3): STG = LTG due to ELOS  Skilled Therapeutic Interventions: Skilled treatment session focused on cognitive goals. Upon arrival, patient asking questions regarding his injury and overall progress. SLP answered all questions to his satisfaction. Patient's BP low today and patient asking questions regarding how he can help manage his BP. SLP provided education regarding importance of hydration. Patient verbalized understanding and reported that he has not been eating and drinking as much as he should. SLP provided a list of options on hand at RN station and how to request items. Patient verbalized understanding and consumed a snack of a muffin and 2 milks during session. Patient left upright in bed with alarm on and all needs within reach. Continue with current plan of care.      Pain No/Denies Pain   Agitated Behavior Scale: TBI Observation Details Observation Environment: Patient's room Start of observation period - Date: 04/24/21 Start of observation period - Time: 1400 End of observation period - Date: 04/24/21 End of observation period - Time: 1430 Agitated Behavior Scale (DO NOT LEAVE BLANKS) Short attention span, easy distractibility, inability to concentrate: Absent Impulsive, impatient, low tolerance for pain or frustration: Absent Uncooperative, resistant to care, demanding: Absent Violent and/or threatening violence toward people or property: Absent Explosive and/or unpredictable anger: Absent Rocking, rubbing, moaning, or other self-stimulating behavior: Absent Pulling at tubes, restraints, etc.: Absent Wandering from treatment areas: Absent Restlessness, pacing, excessive movement: Absent Repetitive  behaviors, motor, and/or verbal: Absent Rapid, loud, or excessive talking: Absent Sudden changes of mood: Absent Easily initiated or excessive crying and/or laughter: Absent Self-abusiveness, physical and/or verbal: Absent Agitated behavior scale total score: 14  Therapy/Group: Individual Therapy  [name] 04/24/2021, 3:28 PM

## 2021-04-24 NOTE — Progress Notes (Signed)
Pt states feet still very painful and burning states "When it hurts this bad at home I soak them in ice water. I'd like to get into a chair and soak them. Pt was educated to the increased risk of frost bite d/t decreased sensation. Pt states will take feet out after a few moments and then reinsert. Pt up to chair seat alarm on and checked. Pt given call light.

## 2021-04-24 NOTE — Progress Notes (Signed)
Orthopedic Tech Progress Note Patient Details:  [name] 06/11/1941 [id number]  Dropped off an ABDOMINAL BINDER to patient room  Ortho Devices Type of Ortho Device: Abdominal binder Ortho Device/Splint Interventions: Ordered   Post Interventions Patient Tolerated: Well Instructions Provided: Care of device  Janit Pagan 04/24/2021, 4:00 PM

## 2021-04-24 NOTE — Plan of Care (Signed)
  Problem: Consults Goal: RH BRAIN INJURY PATIENT EDUCATION Description: Description: See Patient Education module for eduction specifics Outcome: Progressing Goal: Skin Care Protocol Initiated - if Braden Score 18 or less Description: If consults are not indicated, leave blank or document N/A Outcome: Progressing Goal: Nutrition Consult-if indicated Outcome: Progressing Goal: Diabetes Guidelines if Diabetic/Glucose > 140 Description: If diabetic or lab glucose is > 140 mg/dl - Initiate Diabetes/Hyperglycemia Guidelines & Document Interventions  Outcome: Progressing   Problem: RH BOWEL ELIMINATION Goal: RH STG MANAGE BOWEL WITH ASSISTANCE Description: STG Manage Bowel with Supervision Assistance. Outcome: Progressing Goal: RH STG MANAGE BOWEL W/MEDICATION W/ASSISTANCE Description: STG Manage Bowel with Medication with Supervision Assistance. Outcome: Progressing   Problem: RH BLADDER ELIMINATION Goal: RH STG MANAGE BLADDER WITH ASSISTANCE Description: STG Manage Bladder With Supervision Assistance Outcome: Progressing Goal: RH STG MANAGE BLADDER WITH MEDICATION WITH ASSISTANCE Description: STG Manage Bladder With Medication With Supervision Assistance. Outcome: Progressing   Problem: RH SKIN INTEGRITY Goal: RH STG ABLE TO PERFORM INCISION/WOUND CARE W/ASSISTANCE Description: STG Able To Perform Incision/Wound Care With Supervision Assistance. Outcome: Progressing   Problem: RH SAFETY Goal: RH STG ADHERE TO SAFETY PRECAUTIONS W/ASSISTANCE/DEVICE Description: STG Adhere to Safety Precautions With Cues and Reminders. Outcome: Progressing Goal: RH STG DECREASED RISK OF FALL WITH ASSISTANCE Description: STG Decreased Risk of Fall With Cues and Reminders. Outcome: Progressing   Problem: RH COGNITION-NURSING Goal: RH STG USES MEMORY AIDS/STRATEGIES W/ASSIST TO PROBLEM SOLVE Description: STG Uses Memory Aids/Strategies With Cues and Reminders to Problem Solve. Outcome:  Progressing Goal: RH STG ANTICIPATES NEEDS/CALLS FOR ASSIST W/ASSIST/CUES Description: STG Anticipates Needs/Calls for Assist With Cues and Reminders. Outcome: Progressing   Problem: RH PAIN MANAGEMENT Goal: RH STG PAIN MANAGED AT OR BELOW PT'S PAIN GOAL Description: < 3 on a 0-10 pain scale. Outcome: Progressing   Problem: RH KNOWLEDGE DEFICIT BRAIN INJURY Goal: RH STG INCREASE KNOWLEDGE OF SELF CARE AFTER BRAIN INJURY Description: Patient will demonstrate knowledge of medication management, pain management, bowel/bladder management, skin/wound care with educational materials and handouts provided by staff independently at discharge. Outcome: Progressing

## 2021-04-24 NOTE — Patient Care Conference (Signed)
Inpatient RehabilitationTeam Conference and Plan of Care Update Date: 04/24/2021   Time: 10:20 AM    Patient Name: [name]      Medical Record Number: [id number]  Date of Birth: Nov 09, 1940 Sex: Male         Room/Bed: 4W14C/4W14C-01 Payor Info: Payor: [place] / Plan: [facility] MEDICARE / Product Type: *No Product type* /    Admit Date/Time:  04/06/2021  2:33 PM  Primary Diagnosis:  Traumatic subdural hematoma [facility])  Hospital Problems: Principal Problem:   Traumatic subdural hematoma [facility])    Expected Discharge Date: Expected Discharge Date: 04/28/21  Team Members Present: Physician leading conference: Dr. Alger Simons Care Coodinator Present: Loralee Pacas, LCSWA;Didier Brandenburg Creig Hines, RN, BSN, [place] Nurse Present: Dorthula Nettles, RN PT Present: Apolinar Junes, PT OT Present: Laverle Hobby, OT SLP Present: Weston Anna, SLP PPS Coordinator present : Gunnar Fusi, SLP     Current Status/Progress Goal Weekly Team Focus  Bowel/Bladder   Patient is continent of both B&B  Patient will remain continent of both  Scheduled toileting   Swallow/Nutrition/ Hydration             ADL's   S UB ADLs, min A LB ADLs- resistant to AE use, CGA transfers, min A- CGA toileting tasks  supervision overall  ADL training, transfers, cognitive retraining, d/c planning, family edu   Mobility   CGA overall, gait 250 ft using RW, limited by fatigue this week  Supervision overall, downgrading gait to CGA due to instability with fatigue  Activity tolerance, balance, functional mobility, gait and stair training, patient/caregiver education, d/c planning   Communication             Safety/Cognition/ Behavioral Observations  Min A  Supervision  Complex problem solving, recall, awareness of deficits/errors   Pain   Patient complains of neuropathic pain 8/10 on both his feet even after scheduled Lyrica given. "Feel like burning" as stated. Relieved by lidocaine patch and oxycodone   Patient's pain will improve to atleast 2/10  Patient will be free from pain   Skin   Patient has no new skin issues aside from the old abrasions in his left shin, left arm and some redness on his bilateral toes.  Patient will have no new skin issues  Assess patient skin q shift and prn, keep patient clean and dry,     Discharge Planning:  Pt to d/c to home with his wife who will provide 24/7 care.   Team Discussion: Medication changes over the past week, may back off on a few. Continent B/B, reports pain of pins and needles to bilateral feet. Very sleepy the past couple of days.  Patient on target to meet rehab goals: yes, supervision upper body ADL's, min assist lower body ADL's. Contact guard ADL transfers. Supervision goals. Contact guard for all transfers, had an assisted fall last week. Contact guard gait goals now. SLP reports memory improving functionally.   *See Care Plan and progress notes for long and short-term goals.   Revisions to Treatment Plan:  Medication adjustments.  Teaching Needs: Family education, medication management, pain management, diabetes education, skin/wound care, transfer training, gait training, balance training, endurance training, safety awareness.  Current Barriers to Discharge: Decreased caregiver support, Medical stability, Home enviroment access/layout, Lack of/limited family support, Medication compliance, and Behavior  Possible Resolutions to Barriers: Continue current medications, provide emotional support.     Medical Summary Current Status: RLAS VII, pain better with regimen. tolerating meds ok thus far. adjusting diabetes  regimen for better control  Barriers to Discharge: Medical stability   Possible Resolutions to Barriers/Weekly Focus: daily assessment of labs and pt data   Continued Need for Acute Rehabilitation Level of Care: The patient requires daily medical management by a physician with specialized training in physical medicine and  rehabilitation for the following reasons: Direction of a multidisciplinary physical rehabilitation program to maximize functional independence : Yes Medical management of patient stability for increased activity during participation in an intensive rehabilitation regime.: Yes Analysis of laboratory values and/or radiology reports with any subsequent need for medication adjustment and/or medical intervention. : Yes   I attest that I was present, lead the team conference, and concur with the assessment and plan of the team.   Cristi Loron 04/24/2021, 3:25 PM

## 2021-04-24 NOTE — Progress Notes (Signed)
Occupational Therapy TBI Note  Patient Details  Name: MERIK MIGNANO MRN: [id number] Date of Birth: 1941-04-18  Today's Date: 04/24/2021 OT Individual Time: 1030-1130 OT Individual Time Calculation (min): 60 min    Short Term Goals: Week 3:  OT Short Term Goal 1 (Week 3): STG=LTG d/t ELOS  Skilled Therapeutic Interventions/Progress Updates:    Pt received soundly sleeping, he was awoken easily and alert after a minute or two. Pt had no c/o pain. He completed bed mobility to EOB with supervision. He required mod cueing for technique to don socks and shoes, and then min A to tie shoes. He completed ambulatory transfer into the bathroom with close supervision- CGA using the RW. Toileting tasks with CGA. He returned to his w/c and completed oral care and grooming tasks at the sink with set up assist. Extensive discussion re shower set up and transfer with pt and his wife, now present. Retrieved shower chair and demonstrated use. Pt's wife had several pictures of their home shower, so reviewed this and fall risk reduction strategies with them both. Pt ended session with 150 ft of functional mobility at CGA level with the RW. Pt returned to this room and was left EOB with NT present.   Therapy Documentation Precautions:  Precautions Precautions: Fall, Other (comment) Precaution Comments: Sinus precautions, may not blow nose, may not use straw Restrictions Weight Bearing Restrictions: No Other Position/Activity Restrictions: see above, sinus precuations  Agitated Behavior Scale: TBI Observation Details Observation Environment: CIR Start of observation period - Date: 04/24/21 Start of observation period - Time: 1030 End of observation period - Date: 04/24/21 End of observation period - Time: 1130 Agitated Behavior Scale (DO NOT LEAVE BLANKS) Short attention span, easy distractibility, inability to concentrate: Absent Impulsive, impatient, low tolerance for pain or frustration:  Absent Uncooperative, resistant to care, demanding: Absent Violent and/or threatening violence toward people or property: Absent Explosive and/or unpredictable anger: Absent Rocking, rubbing, moaning, or other self-stimulating behavior: Absent Pulling at tubes, restraints, etc.: Absent Wandering from treatment areas: Absent Restlessness, pacing, excessive movement: Absent Repetitive behaviors, motor, and/or verbal: Absent Rapid, loud, or excessive talking: Absent Sudden changes of mood: Absent Easily initiated or excessive crying and/or laughter: Absent Self-abusiveness, physical and/or verbal: Absent Agitated behavior scale total score: 14    Therapy/Group: Individual Therapy  [name] 04/24/2021, 12:25 PM

## 2021-04-25 LAB — GLUCOSE, CAPILLARY
Glucose-Capillary: 112 mg/dL — ABNORMAL HIGH (ref 70–99)
Glucose-Capillary: 120 mg/dL — ABNORMAL HIGH (ref 70–99)
Glucose-Capillary: 179 mg/dL — ABNORMAL HIGH (ref 70–99)
Glucose-Capillary: 166 mg/dL — ABNORMAL HIGH (ref 70–99)
Glucose-Capillary: 134 mg/dL — ABNORMAL HIGH (ref 70–99)

## 2021-04-25 MED ORDER — ENSURE ENLIVE PO LIQD
237.0000 mL | Freq: Three times a day (TID) | ORAL | Status: DC
  Administered 2021-04-25 – 2021-04-28 (×8): 237 mL via ORAL

## 2021-04-25 NOTE — Progress Notes (Signed)
Occupational Therapy TBI Note  Patient Details  Name: [name] MRN: [id number] Date of Birth: August 23, 1941  Today's Date: 04/25/2021 OT Individual Time: 1000-1100 OT Individual Time Calculation (min): 60 min   Today's Date: 04/25/2021 OT Individual Time: 2841-3244 OT Individual Time Calculation (min): 60 min    Short Term Goals: Week 4:     Skilled Therapeutic Interventions/Progress Updates:     Pt received in bed with POOR arousal snoring most of session. Pt wife present for fmaily education with lots of questions about facial fractures, HH v OP services, BP monitoring and prognosis. Significant time spent on each topic as well as hands on practice of donning compression socks, figure 8 wrapping with ace to support BP and abdominal binder donning. Pt wife dons 1 sock with bag trick and wraps 1LE with aces bandage. OT and wife go to ADL apartment and OT reviews posterior method of walker use to stedy self while stepping over edge of shower with set up sam as home with stool in corner. Pt wife practices with min cuing. Video stored for later date.  Pt left at end of session in bed with exit alarm on, call light in reach and all needs met   Pt received in bed with wife present and pt requires increased time to arouse. Pt completes sup>sit with S using bed rails.pt applies abdominal binder for BP support 107/45 in bed, EOB 99/65 with abd/teds.   Therapeutic activity Pt completes transfer training with CGA overall practicing step over transfer for walk in shower to demo to wife and have pt practice using rail in hallway as grab bar simulation in walk in shower as well as shower chair in corner like a stool.   Pt left at end of session in bed with exit alarm on, call light in reach and all needs met    Therapy Documentation Precautions:  Precautions Precautions: Fall, Other (comment) Precaution Comments: Sinus precautions, may not blow nose, may not use straw Restrictions Weight  Bearing Restrictions: No Other Position/Activity Restrictions: see above, sinus precuations General:   Vital Signs: Therapy Vitals Temp: 97.9 F (36.6 C) Temp Source: Oral Pulse Rate: 78 Resp: 20 BP: (!) 103/58 Patient Position (if appropriate): Lying Oxygen Therapy SpO2: 97 % O2 Device: Room Air Pain: Pain Assessment Pain Scale: 0-10 Pain Score: Asleep Pain Type: Chronic pain Pain Location: Foot Pain Orientation: Right;Left Pain Descriptors / Indicators: Constant Pain Frequency: Constant Pain Onset: On-going Patients Stated Pain Goal: 3 Pain Intervention(s): Medication (See eMAR) Agitated Behavior Scale: TBI  Observation Details Observation Environment: pt room Start of observation period - Date: 04/25/21 Start of observation period - Time: 1530 End of observation period - Date: 04/25/21 End of observation period - Time: 1600 Agitated Behavior Scale (DO NOT LEAVE BLANKS) Short attention span, easy distractibility, inability to concentrate: Absent Impulsive, impatient, low tolerance for pain or frustration: Absent Uncooperative, resistant to care, demanding: Absent Violent and/or threatening violence toward people or property: Absent Explosive and/or unpredictable anger: Absent Rocking, rubbing, moaning, or other self-stimulating behavior: Absent Pulling at tubes, restraints, etc.: Absent Wandering from treatment areas: Absent Restlessness, pacing, excessive movement: Absent Repetitive behaviors, motor, and/or verbal: Absent Rapid, loud, or excessive talking: Absent Sudden changes of mood: Absent Easily initiated or excessive crying and/or laughter: Absent Self-abusiveness, physical and/or verbal: Absent Agitated behavior scale total score: 14  ADL: ADL Grooming: Supervision/safety Where Assessed-Grooming: Edge of bed Upper Body Bathing: Supervision/safety Where Assessed-Upper Body Bathing: Edge of bed Lower Body Bathing: Maximal  assistance Where  Assessed-Lower Body Bathing: Edge of bed, Bed level Upper Body Dressing: Supervision/safety Lower Body Dressing: Maximal assistance Where Assessed-Lower Body Dressing: Bed level Toileting: Unable to assess Toilet Transfer: Unable to assess Vision   Perception    Praxis   Exercises:   Other Treatments:     Therapy/Group: Individual Therapy  Tonny Branch 04/25/2021, 6:56 AM

## 2021-04-25 NOTE — Plan of Care (Signed)
  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   Problem: RH Bed to Chair Transfers Goal: LTG Patient will perform bed/chair transfers w/assist (PT) Description: LTG: Patient will perform bed to chair transfers with assistance (PT). 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    Problem: RH Car Transfers Goal: LTG Patient will perform car transfers with assist (PT) Description:  LTG: Patient will perform car transfers with assistance (PT). 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   Problem: RH Furniture Transfers Goal: LTG Patient will perform furniture transfers w/assist (OT/PT) Description: LTG: Patient will perform furniture transfers  with assistance (OT/PT). 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

## 2021-04-25 NOTE — Progress Notes (Signed)
Physical Therapy Session Note  Patient Details  Name: [name] MRN: [id number] Date of Birth: 1941/09/15  Today's Date: 04/25/2021 PT Individual Time: 1350-1450 PT Individual Time Calculation (min): 60 min   Short Term Goals: Week 3:  PT Short Term Goal 1 (Week 3): STG=LTG due to ELOS.  Skilled Therapeutic Interventions/Progress Updates:     Patient sitting EOB with his wife at bedside upon PT arrival. Patient alert and agreeable to PT session. Patient denied pain during session. Patient reported feeling better this afternoon, however, continues to have poor sleep at night due to B foot pain. Patient's wife participated in hands on training throughout session in preparation for d/c this week.   Therapeutic Activity: Bed Mobility: Patient performed supine to sit independently in a flat bed without use of bed rails to simulate home set-up.  Transfers: Patient performed sit to/from stand x6 with CGA and min A x1 due to minor posterior LOB using RW. Patient's wife performed 4/6 transfers with min-mod cues each trial for hand placement and proximity to patient for safety during transfers. Provided verbal cues for patient to reach back to sit x1. Patient performed a simulated sedan height car transfer with min A using car frame spontaneously on first trial and CGA using RW following PT demonstration on second trial. Provided cues for safe technique. Patient reports that he usually places his rollator in the car then walks to the door holding the car frame, educated on fall risk with this technique and reminded patient to ask for help from his wife, as she will be with him any time they leave in the car at this time. Patient and his wife stated understanding.   Gait Training:  Patient ambulated >150 feet using RW with CGA and gait belt for safety with his wife providing assist. Ambulated with decreased gait speed, decreased step length and height, B knee hyperextension in stance, forward trunk lean,  and downward head gaze. Provided verbal cues for erect posture, looking ahead, and proximity to RW.  Patient ambulated 20 feet, ascended/descended 4x6" steps using B rails and ambulated 20 feet back with CGA using RW x2. Performed step-to gait pattern leading with R while ascending and L while descending. Provided cues and demonstration for technique and sequencing on first trial, patient's wife performed assist on second trial with min cues for safe guarding technique.   Educated on management of orthostatic hypotension and symptoms of dizziness at home, discussed safety and fall risk concerns, and activation of emergency services in the event of a fall with injury, head impact, or LOC throughout session.   Patient in bed with his wife at bedside at end of session with breaks locked, bed alarm set, and all needs within reach.   Therapy Documentation Precautions:  Precautions Precautions: Fall, Other (comment) Precaution Comments: Sinus precautions, may not blow nose, may not use straw Restrictions Weight Bearing Restrictions: No Other Position/Activity Restrictions: see above, sinus precuations Agitated Behavior Scale: TBI  Observation Details Observation Environment: pt room Start of observation period - Date: 04/25/21 Start of observation period - Time: 1350 End of observation period - Date: 04/25/21 End of observation period - Time: 1450 Agitated Behavior Scale (DO NOT LEAVE BLANKS) Short attention span, easy distractibility, inability to concentrate: Absent Impulsive, impatient, low tolerance for pain or frustration: Absent Uncooperative, resistant to care, demanding: Absent Violent and/or threatening violence toward people or property: Absent Explosive and/or unpredictable anger: Absent Rocking, rubbing, moaning, or other self-stimulating behavior: Absent Pulling at tubes, restraints,  etc.: Absent Wandering from treatment areas: Absent Restlessness, pacing, excessive movement:  Absent Repetitive behaviors, motor, and/or verbal: Absent Rapid, loud, or excessive talking: Absent Sudden changes of mood: Absent Easily initiated or excessive crying and/or laughter: Absent Self-abusiveness, physical and/or verbal: Absent Agitated behavior scale total score: 14   Therapy/Group: Individual Therapy  Preslei Blakley L Nicola Heinemann PT, DPT  04/25/2021, 9:25 PM
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
## 2021-04-25 NOTE — Progress Notes (Signed)
Patient ID: [name], male   DOB: 03/02/1941, 80 y.o.   MRN: [id number]  Per PT, pt will need HH therapies instead. SW left message for pt wife to inform on above, and requested f/u to discuss HHA preference.   SW met with pt wife to discuss above. No HHA preference.   SW sent HHPT/OT/SLP/SN/aide referral to [facility] and waiting on follow-up.  Loralee Pacas, MSW, [place] Office: [phone] Cell: [phone] Fax: [phone]

## 2021-04-25 NOTE — Progress Notes (Signed)
Speech Language Pathology TBI Note  Patient Details  Name: [name] MRN: [id number] Date of Birth: 1941-05-22  Today's Date: 04/25/2021 SLP Individual Time: 1300-1345 SLP Individual Time Calculation (min): 45 min  Short Term Goals: Week 3: SLP Short Term Goal 1 (Week 3): STG = LTG due to ELOS  Skilled Therapeutic Interventions: Skilled treatment session focused on family education with the patient and his wife. Both were educated regarding the importance of 24/7 supervision to maximize safe decision making as well as overall safety within the home.  Wife and patient were provided education regarding current cognitive functioning and strategies to utilize at home to maximize attention, short-term recall and how to stay cognitively engaged. All questions were answered and handouts were given to reinforce information. Patient left sitting EOB with wife present. Continue with current plan of care.      Pain No/Denies Pain   Agitated Behavior Scale: TBI Observation Details Observation Environment: Patient's room Start of observation period - Date: 04/25/21 Start of observation period - Time: 1300 End of observation period - Date: 04/25/21 End of observation period - Time: 1345 Agitated Behavior Scale (DO NOT LEAVE BLANKS) Short attention span, easy distractibility, inability to concentrate: Absent Impulsive, impatient, low tolerance for pain or frustration: Absent Uncooperative, resistant to care, demanding: Absent Violent and/or threatening violence toward people or property: Absent Explosive and/or unpredictable anger: Absent Rocking, rubbing, moaning, or other self-stimulating behavior: Absent Pulling at tubes, restraints, etc.: Absent Wandering from treatment areas: Absent Restlessness, pacing, excessive movement: Absent Repetitive behaviors, motor, and/or verbal: Absent Rapid, loud, or excessive talking: Absent Sudden changes of mood: Absent Easily initiated or excessive  crying and/or laughter: Absent Self-abusiveness, physical and/or verbal: Absent Agitated behavior scale total score: 14  Therapy/Group: Individual Therapy  Romelia Bromell 04/25/2021, 3:29 PM

## 2021-04-26 LAB — GLUCOSE, CAPILLARY
Glucose-Capillary: 86 mg/dL (ref 70–99)
Glucose-Capillary: 257 mg/dL — ABNORMAL HIGH (ref 70–99)
Glucose-Capillary: 163 mg/dL — ABNORMAL HIGH (ref 70–99)
Glucose-Capillary: 253 mg/dL — ABNORMAL HIGH (ref 70–99)

## 2021-04-26 MED ORDER — RIVAROXABAN 20 MG PO TABS
20.0000 mg | ORAL_TABLET | Freq: Every day | ORAL | Status: DC
  Administered 2021-04-26 – 2021-04-28 (×3): 20 mg via ORAL
  Filled 2021-04-26 (×3): qty 1

## 2021-04-26 NOTE — Progress Notes (Signed)
Physical Therapy Discharge Summary  Patient Details  Name: [name] MRN: [id number] Date of Birth: 02/23/1941  Today's Date: 04/27/2021 PT Individual Time: 1101-1200 PT Individual Time Calculation (min): 59 min    Patient has met 10 of 10 long term goals due to improved activity tolerance, improved balance, improved postural control, increased strength, decreased pain, ability to compensate for deficits, improved attention, improved awareness, and improved coordination.  Patient to discharge at an ambulatory level Supervision-CGA using RW. Using transport chair for energy conservation PRN to prevent falls.  Patient's care partner is independent to provide the necessary physical and cognitive assistance at discharge.  Reasons goals not met: n/a  Recommendation:  Patient will benefit from ongoing skilled PT services in home health setting to continue to advance safe functional mobility, address ongoing impairments in balance, strength, activity tolerance, safety awareness, functional mobility, gait and stair training, attention, and minimize fall risk.  Equipment: RW and transport chair  Reasons for discharge: treatment goals met  Patient/family agrees with progress made and goals achieved: Yes  PT Discharge Precautions/Restrictions Precautions Precautions: Fall;Other (comment) Precaution Comments: Sinus precautions, may not blow nose, may not use straw Restrictions Weight Bearing Restrictions: No Other Position/Activity Restrictions: watch BP, TEDs and abdominal binder when OOB Vision/Perception  Vision - Assessment Eye Alignment: Within Functional Limits Alignment/Gaze Preference: Within Defined Limits Tracking/Visual Pursuits: Able to track stimulus in all quads without difficulty Saccades: Within functional limits Perception Perception: Within Functional Limits Praxis Praxis: Intact  Cognition Overall Cognitive Status: Impaired/Different from baseline Arousal/Alertness:  Awake/alert Orientation Level: Oriented X4 Attention: Sustained Sustained Attention Impairment: Functional complex;Verbal complex Memory: Impaired Memory Impairment: Retrieval deficit;Decreased recall of new information Awareness: Impaired Awareness Impairment: Anticipatory impairment Problem Solving: Impaired Problem Solving Impairment: Functional complex Safety/Judgment: Impaired Rancho Los Amigos Scales of Cognitive Functioning: Automatic/appropriate Sensation Sensation Light Touch: Impaired Detail Peripheral sensation comments: bilat LEs below knees, diabetic neuropathy Light Touch Impaired Details: Impaired RLE;Impaired LLE Proprioception: Impaired by gross assessment;Impaired Detail Proprioception Impaired Details: Impaired LLE;Impaired RLE (ankles/feet) Coordination Gross Motor Movements are Fluid and Coordinated: No Fine Motor Movements are Fluid and Coordinated: No Heel Shin Test: slow and deliberate, limited by sensory deficits Motor  Motor Motor: Abnormal postural alignment and control Motor - Discharge Observations: continues to have kyphotic posture, mild intermittent posterior tendency, increased upper extremity use for balance  Mobility Bed Mobility Bed Mobility: Rolling Right;Rolling Left;Supine to Sit;Sit to Supine Rolling Right: Independent Rolling Left: Independent Supine to Sit: Independent Sit to Supine: Independent Transfers Sit to Stand: Supervision/Verbal cueing Stand to Sit: Supervision/Verbal cueing Stand Pivot Transfers: Supervision/Verbal cueing Transfer (Assistive device): Rolling walker Locomotion  Gait Ambulation: Yes Gait Assistance: Contact Guard/Touching assist Gait Distance (Feet): 250 Feet Assistive device: Rolling walker Gait Gait: Yes Gait Pattern: Decreased stride length;Decreased dorsiflexion - right;Decreased dorsiflexion - left;Right foot flat;Left foot flat;Right genu recurvatum;Lateral hip instability;Trunk flexed Stairs /  Additional Locomotion Stairs: Yes Stairs Assistance: Contact Guard/Touching assist Stair Management Technique: Two rails Number of Stairs: 8 Height of Stairs: 6 Wheelchair Mobility Wheelchair Mobility: No  Trunk/Postural Assessment  Cervical Assessment Cervical Assessment: Exceptions to WFL (forward head in sitting and standing) Thoracic Assessment Thoracic Assessment: Exceptions to WFL (rounded shoulders, flexed thoracic posture) Lumbar Assessment Lumbar Assessment: Exceptions to WFL (posterior pelvic tilt) Postural Control Postural Control: Deficits on evaluation (decresed/delayed)  Balance Balance Balance Assessed: Yes Static Sitting Balance Static Sitting - Balance Support: Feet supported Static Sitting - Level of Assistance: 7: Independent Dynamic Sitting Balance Dynamic Sitting - Level of   Assistance: 6: Modified independent (Device/Increase time) Dynamic Standing Balance Dynamic Standing - Balance Support: During functional activity Dynamic Standing - Level of Assistance: 5: Stand by assistance (intermittent CGA) Extremity Assessment  RLE Assessment RLE Assessment: Exceptions to WFL Active Range of Motion (AROM) Comments: tight hamstrings/heel cords General Strength Comments: Grossly 4+ to 5/5 throughout except DF/PF 3/5, poor functional strength with mobility LLE Assessment LLE Assessment: Exceptions to WFL Active Range of Motion (AROM) Comments: tight hamstrings/heel cords General Strength Comments: Grossly 4+ to 5/5 throughout except DF/PF 3/5, poor functional strength with mobility    P.  Cherie L Grunenberg 04/26/2021, 9:12 PM 
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
## 2021-04-26 NOTE — Progress Notes (Signed)
Physical Therapy TBI Note  Patient Details  Name: [name] MRN: [id number] Date of Birth: 02/23/1941  Today's Date: 04/26/2021 PT Individual Time: 1300-1425 PT Individual Time Calculation (min): 85 min   Short Term Goals: Week 3:  PT Short Term Goal 1 (Week 3): STG=LTG due to ELOS.  Skilled Therapeutic Interventions/Progress Updates:     Patient in bed upon PT arrival. Patient alert and agreeable to PT session. Patient denied pain during session.  Vitals:  BP sitting without TEDs: 85/54 (mild symptoms) BP sitting with TEDs and abdominal binder: 111/61 (asymptomatic throughout session)  Educated patient on orthostatic hypotension, importance of wearing compression stockings and abdominal binder to reduce hypotension with mobility and risks related to this occurring. Instructed patient to keep a BP log to bring with him to follow-up appointments and to monitor symptoms consistently. Patient able to teach back that he should sit or lie down and assess BP at home with symptoms of dizziness/light headedness.   Therapeutic Activity: Bed Mobility: Patient performed rolling R/L and supine to/from sit independently in the ADL bed.  Transfers: Patient performed sit to/from stand from the hospital bed, w/c x3, ADL bed, and ADL recliner with close supervision for safety using RW. Provided verbal cues for reaching back to sit x2.  Gait Training:  Patient ambulated >100 feet x2 using RW with CGA for safety due to hx of knees buckling. Ambulated with decreased gait speed, decreased step length and height, B knee hyperextension in stance, forward trunk lean, and downward head gaze. Provided verbal cues for erect posture, looking ahead, and proximity to RW. Patient asked to trial pushing therapist in w/c, perseverated on this and was persistent. Provided patient an opportunity to hold on to the w/c without anyone sitting in it and patient with anterior LOB with a small step forward due to w/c rolling  away from patient. Provided min A to maintain balance, patient then stated understanding as to why PT had advised that this activity would not be safe and that he should use the RW at all times at d/c until progressed to another AD by HHPT. Patient in agreement. Patient ascended/descended 8x6" steps using B rails with CGA. Performed step-to gait pattern intermittently changing leading leg without need for increased assist. Provided min cues for technique and sequencing. Patient able to teach back where his wife should be to assist him on the stairs and in agreement that he should not go upstairs in his home at this time for safety.   Educated patient on fall risk/prevention, home modifications to prevent falls, and activation of emergency services in the event of a fall with injury, impact to head, or LOC during session.   Provided patient with a guitar while sitting EOB. Patient played several cords and 1 song on the guitar, reported reduced finger coordination. Educated on therapeutic use of music and fine motor skills with this activity at home, also emphasized safety with activity in sitting. Patient in agreement.  Reviewed patient's progress since admission and goals during rest breaks throughout session. Patient reports feeling confident about d/c and denied any further questions at end of session.  Patient sitting EOB handed off to [place], [place], at end of session.   Therapy Documentation Precautions:  Precautions Precautions: Fall, Other (comment) Precaution Comments: Sinus precautions, may not blow nose, may not use straw Restrictions Weight Bearing Restrictions: No Other Position/Activity Restrictions: see above, sinus precuations    Therapy/Group: Individual Therapy  [place] Sylvan L Conception Doebler PT, DPT  04/26/2021, 8:51  PM

## 2021-04-26 NOTE — Progress Notes (Signed)
Occupational Therapy Session Note  Patient Details  Name: [name] MRN: [id number] Date of Birth: October 03, 1941  Today's Date: 04/26/2021 OT Individual Time: 3267-1245 OT Individual Time Calculation (min): 37 min    Short Term Goals: Week 4:  OT Short Term Goal 1 (Week 3): STG=LTG d/t ELOS  Skilled Therapeutic Interventions/Progress Updates:    Pt received in w/c with wife present, reporting no pain, and agreeable to OT. Discussion with wife/pt building therapeutic rapport and discussion of progress made and d/c plan, home safety, and diagnosis education (especially targeting cognition). Pt provided with emotional encouragement of progress made as he reported he felt guilty for "being mean to so many people" and seemed unsure of/decreased awareness towards progress with cognition since admission. Pt engaged in dynamic standing balance/tolerance activity with RW also targeting problem solving and executive functioning. Sit<>stand CGA for balance and pt reported difficulties maintaining standing due to neuropathy. Using variety of coins, pt successfully counted change for 10 different amounts with no errors or vc's. Pt demoed good executive functioning and awareness when asked to count change for an amount he did not have the correct coins for, stating "I can't do that/I need more money." Pt remained seated in w/c, alarm set, set up A for eating ice cream per pt request (magic cup) (demoed appropriate [place] and strength when opening lid and spoon).   Therapy Documentation Precautions:  Precautions Precautions: Fall, Other (comment) Precaution Comments: Sinus precautions, may not blow nose, may not use straw Restrictions Weight Bearing Restrictions: No Other Position/Activity Restrictions: see above, sinus precuations  Pain: Pain Assessment Pain Scale: 0-10 Pain Score: 0-No pain    Therapy/Group: Individual Therapy  Mellissa Kohut 04/26/2021, 1:03 PM
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
## 2021-04-26 NOTE — Progress Notes (Signed)
Speech Language Pathology TBI Note  Patient Details  Name: [name] MRN: [id number] Date of Birth: [birth year]  Today's Date: 04/26/2021 SLP Individual Time: 1430-1500 SLP Individual Time Calculation (min): 30 min  Short Term Goals: Week 3: SLP Short Term Goal 1 (Week 3): STG = LTG due to ELOS  Skilled Therapeutic Interventions: Skilled treatment session focused on cognitive goals. SLP facilitated session by providing Max A verbal cues for recall of his current medications and their functions. Patient reported he does not know the functions as he keeps a written aid in his wallet to assist in recall at home. Throughout functional conversation regarding medications, patient required Min verbal cues for mental flexibility, especially in regards to changes in times of day of medications administration. Will attempt organizing a pill box during next session. Patient left upright in bed with alarm on and all needs within reach. Continue with current plan of care.      Pain No/Denies Pain   Agitated Behavior Scale: TBI Observation Details Observation Environment: Patient's room Start of observation period - Date: 04/26/21 Start of observation period - Time: 1430 End of observation period - Date: 04/26/21 End of observation period - Time: 1500 Agitated Behavior Scale (DO NOT LEAVE BLANKS) Short attention span, easy distractibility, inability to concentrate: Absent Impulsive, impatient, low tolerance for pain or frustration: Absent Uncooperative, resistant to care, demanding: Absent Violent and/or threatening violence toward people or property: Absent Explosive and/or unpredictable anger: Absent Rocking, rubbing, moaning, or other self-stimulating behavior: Absent Pulling at tubes, restraints, etc.: Absent Wandering from treatment areas: Absent Restlessness, pacing, excessive movement: Absent Repetitive behaviors, motor, and/or verbal: Absent Rapid, loud, or excessive talking:  Absent Sudden changes of mood: Absent Easily initiated or excessive crying and/or laughter: Absent Self-abusiveness, physical and/or verbal: Absent Agitated behavior scale total score: 14  Therapy/Group: Individual Therapy  Brantleigh Mifflin 04/26/2021, 3:34 PM

## 2021-04-26 NOTE — Progress Notes (Signed)
Occupational Therapy TBI Note  Patient Details  Name: [name] MRN: [id number] Date of Birth: 04-May-1941  Today's Date: 04/26/2021 OT Individual Time: 1002-1100 OT Individual Time Calculation (min): 58 min    Short Term Goals: Week 3:  OT Short Term Goal 1 (Week 3): STG=LTG d/t ELOS  Skilled Therapeutic Interventions/Progress Updates:    Pt greeted semi-reclined in bed asleep, but easy to wake and agreeable to OT treatment session. Pt declined any bathing, dressing tasks and stated he had just recently returned from the bathroom. Pt reported his BP had been better today. OT Ace wrapped B Les and donned abdominal binder for BP support. Pt reported some dizinness initially when transitioning from supine to sit, but this resolved momentarily.  BP sitting: 102/61 BP standing 2 mins: 100/71 Pt sat EOB and donned shoes, then needed CGA sit<>stand w/ RW and CGA to ambulate over to wc. Pt agreeable to go outside. OT educated on safety as wheeling pt outside in wc. UB there-ex completed using orange therabnd. Tricpes press, bicep curl, straight arm raise, and side arm raise. Pt returned to room and reported neuropothy in feet bothering him and ACE wraps needed to be removed. OT removed Ace wraps for comfort since pt in sitting position. Pt left in wc with wife present, chair alarm pad on, and needs met.   Therapy Documentation Precautions:  Precautions Precautions: Fall, Other (comment) Precaution Comments: Sinus precautions, may not blow nose, may not use straw Restrictions Weight Bearing Restrictions: No Other Position/Activity Restrictions: see above, sinus precuations  Pain: Pain Assessment Pain Scale: 0-10 Pain Score: 4 B feet- neuropothy 'Rest and repositioned for comofrt Agitated Behavior Scale: TBI Observation Details Observation Environment: CIR Start of observation period - Date: 04/26/21 Start of observation period - Time: 1000 End of observation period - Date:  04/26/21 End of observation period - Time: 1100 Agitated Behavior Scale (DO NOT LEAVE BLANKS) Short attention span, easy distractibility, inability to concentrate: Absent Impulsive, impatient, low tolerance for pain or frustration: Absent Uncooperative, resistant to care, demanding: Absent Violent and/or threatening violence toward people or property: Absent Explosive and/or unpredictable anger: Absent Rocking, rubbing, moaning, or other self-stimulating behavior: Absent Pulling at tubes, restraints, etc.: Absent Wandering from treatment areas: Absent Restlessness, pacing, excessive movement: Absent Repetitive behaviors, motor, and/or verbal: Absent Rapid, loud, or excessive talking: Absent Sudden changes of mood: Absent Easily initiated or excessive crying and/or laughter: Absent Self-abusiveness, physical and/or verbal: Absent Agitated behavior scale total score: 14   Therapy/Group: Individual Therapy  Valma Cava 04/26/2021, 11:27 AM

## 2021-04-27 LAB — BASIC METABOLIC PANEL
Anion gap: 10 (ref 5–15)
BUN: 23 mg/dL (ref 8–23)
CO2: 23 mmol/L (ref 22–32)
Calcium: 9.4 mg/dL (ref 8.9–10.3)
Chloride: 104 mmol/L (ref 98–111)
Creatinine, Ser: 1.13 mg/dL (ref 0.61–1.24)
GFR, Estimated: >60 mL/min (ref >60)
Glucose, Bld: 126 mg/dL — ABNORMAL HIGH (ref 70–99)
Potassium: 3.9 mmol/L (ref 3.5–5.1)
Sodium: 137 mmol/L (ref 135–145)

## 2021-04-27 LAB — GLUCOSE, CAPILLARY
Glucose-Capillary: 152 mg/dL — ABNORMAL HIGH (ref 70–99)
Glucose-Capillary: 121 mg/dL — ABNORMAL HIGH (ref 70–99)
Glucose-Capillary: 137 mg/dL — ABNORMAL HIGH (ref 70–99)
Glucose-Capillary: 212 mg/dL — ABNORMAL HIGH (ref 70–99)

## 2021-04-27 MED ORDER — ALBUTEROL SULFATE HFA 108 (90 BASE) MCG/ACT IN AERS: 1.0000 | INHALATION_SPRAY | RESPIRATORY_TRACT | 2 refills | Status: DC | PRN

## 2021-04-27 MED ORDER — GLIMEPIRIDE 2 MG PO TABS: 6.0000 mg | ORAL_TABLET | Freq: Every day | ORAL | 0 refills | Status: AC

## 2021-04-27 MED ORDER — AMIODARONE HCL 200 MG PO TABS: 200.0000 mg | ORAL_TABLET | Freq: Every day | ORAL | 0 refills | Status: DC

## 2021-04-27 MED ORDER — METFORMIN HCL 1000 MG PO TABS: 1000.0000 mg | ORAL_TABLET | Freq: Two times a day (BID) | ORAL | 0 refills | Status: DC

## 2021-04-27 MED ORDER — OXYCODONE HCL 5 MG/5ML PO SOLN: 2.5000 mg | ORAL | 0 refills | Status: DC | PRN

## 2021-04-27 MED ORDER — AMITRIPTYLINE HCL 50 MG PO TABS: 50.0000 mg | ORAL_TABLET | Freq: Every day | ORAL | 0 refills | Status: DC

## 2021-04-27 MED ORDER — SACCHAROMYCES BOULARDII 250 MG PO CAPS: 250.0000 mg | ORAL_CAPSULE | Freq: Two times a day (BID) | ORAL | 0 refills | Status: DC

## 2021-04-27 MED ORDER — PREGABALIN 100 MG PO CAPS: 100.0000 mg | ORAL_CAPSULE | Freq: Two times a day (BID) | ORAL | 0 refills | Status: DC

## 2021-04-27 MED ORDER — FUROSEMIDE 40 MG PO TABS: 40.0000 mg | ORAL_TABLET | Freq: Every day | ORAL | 0 refills | Status: DC

## 2021-04-27 MED ORDER — LIDOCAINE 5 % EX PTCH: 3.0000 | MEDICATED_PATCH | CUTANEOUS | 0 refills | Status: DC

## 2021-04-27 MED ORDER — SENNOSIDES-DOCUSATE SODIUM 8.6-50 MG PO TABS: 1.0000 | ORAL_TABLET | Freq: Every day | ORAL | Status: DC

## 2021-04-27 MED ORDER — B-12 5000 MCG PO CAPS: 5000.0000 ug | ORAL_CAPSULE | Freq: Every day | ORAL | 0 refills | Status: AC

## 2021-04-27 MED ORDER — TOPIRAMATE 200 MG PO TABS: 200.0000 mg | ORAL_TABLET | Freq: Every day | ORAL | 0 refills | Status: DC

## 2021-04-27 MED ORDER — METOPROLOL TARTRATE 25 MG PO TABS: 12.5000 mg | ORAL_TABLET | Freq: Every day | ORAL | 0 refills | Status: DC

## 2021-04-27 MED ORDER — METHOCARBAMOL 500 MG PO TABS: 500.0000 mg | ORAL_TABLET | Freq: Three times a day (TID) | ORAL | 0 refills | Status: DC

## 2021-04-27 MED ORDER — MIRTAZAPINE 15 MG PO TABS: 15.0000 mg | ORAL_TABLET | Freq: Every day | ORAL | 0 refills | Status: AC

## 2021-04-27 MED ORDER — RIVAROXABAN 20 MG PO TABS: 20.0000 mg | ORAL_TABLET | Freq: Every evening | ORAL | 0 refills | Status: DC
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
## 2021-04-27 NOTE — Progress Notes (Signed)
Speech Language Pathology Discharge Summary  Patient Details  Name: [name] MRN: [id number] Date of Birth: 1941/10/18  Today's Date: 04/27/2021 SLP Individual Time: 1300-1345 SLP Individual Time Calculation (min): 45 min   Skilled Therapeutic Interventions:  Skilled treatment session focused on cognitive goals. SLP facilitated session by providing extra time and overall supervision level verbal cues for complex problem solving during a medication management task of organizing a QD pill box. Patient also navigated the internet to locate a pill box to buy independently. Patient left upright sitting EOB with alarm on and all needs within reach. Continue with current plan of care.  Patient has met 3 of 3 long term goals.  Patient to discharge at overall Supervision level.   Reasons goals not met: N/A   Clinical Impression/Discharge Summary: Patient has made functional gains and has met 3 of 3 LTGs this admission. Currently, patient demonstrates behaviors consistent with a Rancho Level VII and requires overall supervision level verbal cues to complete functional and mildly complex tasks safely in regards to problem solving, recall and awareness. Patient and family education is complete and patient will discharge home with 24 hour supervision from family. Patient would benefit from f/u SLP services to maximize his cognitive functioning and overall functional independence in order to reduce caregiver burden.   Care Partner:  Caregiver Able to Provide Assistance: Yes  Type of Caregiver Assistance: Physical;Cognitive  Recommendation:  Home Health SLP;24 hour supervision/assistance  Rationale for SLP Follow Up: Reduce caregiver burden;Maximize cognitive function and independence   Equipment: N/A   Reasons for discharge: Treatment goals met;Discharged from hospital   Patient/Family Agrees with Progress Made and Goals Achieved: Yes    [place], [place] 04/27/2021, 6:41 AM

## 2021-04-27 NOTE — Progress Notes (Signed)
Physical Therapy Session Note  Patient Details  Name: [name] MRN: [id number] Date of Birth: November 04, 1940  Today's Date: 04/27/2021 PT Individual Time: 1101-1200 PT Individual Time Calculation (min): 59 min   Short Term Goals: Week 3:  PT Short Term Goal 1 (Week 3): STG=LTG due to ELOS.  Skilled Therapeutic Interventions/Progress Updates: Pt presents supine in bed and spouse donningTED hose.  PT asist required to complete.  Ace wraps applied to B LEs, toe to knee w/ spouse performing and cues and education given for improved performance, esp. w/ Figure-8 pattern.  Pt transfers sup to sit w/ independence but cueing for increased logroll as tends to rock in "dead bug" position.  Pt required assist to don shoes especially as increased girth of foot w/ ace wraps.  Pt transfers sit to stand w/ supervision and raised bed to height of personal bed.  Pt amb > 150' w/ RW and CGA to supervision w/ cueing for posture.  Pt Amb x 150' to simulated car transfer w/ CGA to supervision.  Pt transferred in and out w/ supervision, but verbal cues for non-use of door.  Pt educated on safe approaches to seat as tends to take the long way around to the seat (ie. Turning all the way around to the left when seat is on his left).  Pt amb into room x 30' w/ RW to bed and then transfers modified SPT bed > w/c w/ CGA.  Seat alarm on and all needs in reach.     Therapy Documentation Precautions:  Precautions Precautions: Fall, Other (comment) Precaution Comments: Sinus precautions, may not blow nose, may not use straw Restrictions Weight Bearing Restrictions: No Other Position/Activity Restrictions: watch BP, TEDs and abdominal binder when OOB General:   Vital Signs:  Pain: no c/o pain.       Therapy/Group: Individual Therapy  [name] 04/27/2021, 12:26 PM

## 2021-04-27 NOTE — Progress Notes (Signed)
Inpatient Rehabilitation Care Coordinator Discharge Note  The overall goal for the admission was met for:   Discharge location: Yes. D/c to home with pt wife.   Length of Stay: Yes. 22 days.   Discharge activity level: Yes. Supervision to [facility]  Home/community participation: Yes. Limited.   Services provided included: MD, RD, PT, OT, SLP, RN, CM, TR, Pharmacy, Neuropsych, and SW  Financial Services: Private Insurance: [facility] Medicare  Choices offered to/list presented to:yes  Follow-up services arranged: Home Health: [facility] for HHPT/OT/SLP/aide/SN , DME: [place] for transport chair, RW and 3in1 BSC (BSC to be delivered to the home), and Patient/Family has no preference for HH/DME agencies  Comments (or additional information):  Patient/Family verbalized understanding of follow-up arrangements: Yes  Individual responsible for coordination of the follow-up plan: contact pt wife Patricia# [phone]  Confirmed correct DME delivered: Rana Snare 04/27/2021    Rana Snare
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
## 2021-04-27 NOTE — Progress Notes (Addendum)
Patient ID: [name], male   DOB: Aug 12, 1941, 80 y.o.   MRN: [id number]  HHPT/OT/SLP/SN/aide referral declined by Angie/[facility] and [facility].   SW met with pt in room while pt was sleeping to discuss pt discharge. SW informed on possible challenges with obtaining HHA but will continue to make efforts.   SW spoke with Cindie/[facility] [phone]) to discuss referral. Referral accepted. SW sent order. SW updated pt wife on above, and reprinted AVS and gave to pt wife.   *SW order 3in1 BSC and requested for item to be delivered to the home.   Loralee Pacas, MSW, [place] Office: [phone] Cell: [phone] Fax: [phone]

## 2021-04-28 LAB — GLUCOSE, CAPILLARY: Glucose-Capillary: 123 mg/dL — ABNORMAL HIGH (ref 70–99)
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
## 2021-04-28 NOTE — Progress Notes (Signed)
INPATIENT REHABILITATION DISCHARGE NOTE   Discharge instructions by: MD  Verbalized understanding: yes  Skin care/Wound care: skin intact  Pain: none  IV's: none  Tubes/Drains: none  Safety instructions: fall risk   Patient belongings: sent with wife  Discharged to: home  Discharged via: private car  Notes:

## 2021-04-30 DIAGNOSIS — S065X0D Traumatic subdural hemorrhage without loss of consciousness, subsequent encounter: Secondary | ICD-10-CM | POA: Diagnosis not present

## 2021-04-30 DIAGNOSIS — M5030 Other cervical disc degeneration, unspecified cervical region: Secondary | ICD-10-CM | POA: Diagnosis not present

## 2021-04-30 DIAGNOSIS — Z7901 Long term (current) use of anticoagulants: Secondary | ICD-10-CM | POA: Diagnosis not present

## 2021-04-30 DIAGNOSIS — I700 Atherosclerosis of aorta: Secondary | ICD-10-CM | POA: Diagnosis not present

## 2021-04-30 DIAGNOSIS — S065X0A Traumatic subdural hemorrhage without loss of consciousness, initial encounter: Secondary | ICD-10-CM | POA: Diagnosis not present

## 2021-04-30 DIAGNOSIS — K5900 Constipation, unspecified: Secondary | ICD-10-CM | POA: Diagnosis not present

## 2021-04-30 DIAGNOSIS — M170 Bilateral primary osteoarthritis of knee: Secondary | ICD-10-CM | POA: Diagnosis not present

## 2021-04-30 DIAGNOSIS — E119 Type 2 diabetes mellitus without complications: Secondary | ICD-10-CM | POA: Diagnosis not present

## 2021-04-30 DIAGNOSIS — D509 Iron deficiency anemia, unspecified: Secondary | ICD-10-CM | POA: Diagnosis not present

## 2021-04-30 DIAGNOSIS — I4891 Unspecified atrial fibrillation: Secondary | ICD-10-CM | POA: Diagnosis not present

## 2021-04-30 DIAGNOSIS — S02842D Fracture of lateral orbital wall, left side, subsequent encounter for fracture with routine healing: Secondary | ICD-10-CM | POA: Diagnosis not present

## 2021-04-30 DIAGNOSIS — Z7982 Long term (current) use of aspirin: Secondary | ICD-10-CM | POA: Diagnosis not present

## 2021-04-30 DIAGNOSIS — S0232XD Fracture of orbital floor, left side, subsequent encounter for fracture with routine healing: Secondary | ICD-10-CM | POA: Diagnosis not present

## 2021-04-30 DIAGNOSIS — Z9181 History of falling: Secondary | ICD-10-CM | POA: Diagnosis not present

## 2021-04-30 DIAGNOSIS — E1142 Type 2 diabetes mellitus with diabetic polyneuropathy: Secondary | ICD-10-CM | POA: Diagnosis not present

## 2021-04-30 DIAGNOSIS — R4701 Aphasia: Secondary | ICD-10-CM | POA: Diagnosis not present

## 2021-04-30 DIAGNOSIS — S0240DD Maxillary fracture, left side, subsequent encounter for fracture with routine healing: Secondary | ICD-10-CM | POA: Diagnosis not present

## 2021-04-30 DIAGNOSIS — M47812 Spondylosis without myelopathy or radiculopathy, cervical region: Secondary | ICD-10-CM | POA: Diagnosis not present

## 2021-04-30 DIAGNOSIS — S0240FD Zygomatic fracture, left side, subsequent encounter for fracture with routine healing: Secondary | ICD-10-CM | POA: Diagnosis not present

## 2021-04-30 DIAGNOSIS — E785 Hyperlipidemia, unspecified: Secondary | ICD-10-CM | POA: Diagnosis not present

## 2021-04-30 DIAGNOSIS — M791 Myalgia, unspecified site: Secondary | ICD-10-CM | POA: Diagnosis not present

## 2021-04-30 DIAGNOSIS — I2510 Atherosclerotic heart disease of native coronary artery without angina pectoris: Secondary | ICD-10-CM | POA: Diagnosis not present

## 2021-04-30 DIAGNOSIS — S065X9A Traumatic subdural hemorrhage with loss of consciousness of unspecified duration, initial encounter: Secondary | ICD-10-CM | POA: Diagnosis not present

## 2021-04-30 DIAGNOSIS — I119 Hypertensive heart disease without heart failure: Secondary | ICD-10-CM | POA: Diagnosis not present

## 2021-05-01 ENCOUNTER — Encounter: Payer: Self-pay | Admitting: Physical Medicine & Rehabilitation

## 2021-05-01 ENCOUNTER — Telehealth: Payer: Self-pay | Admitting: Interventional Cardiology

## 2021-05-01 DIAGNOSIS — S0240FD Zygomatic fracture, left side, subsequent encounter for fracture with routine healing: Secondary | ICD-10-CM | POA: Diagnosis not present

## 2021-05-01 DIAGNOSIS — I4891 Unspecified atrial fibrillation: Secondary | ICD-10-CM | POA: Diagnosis not present

## 2021-05-01 DIAGNOSIS — S065X0A Traumatic subdural hemorrhage without loss of consciousness, initial encounter: Secondary | ICD-10-CM | POA: Diagnosis not present

## 2021-05-01 DIAGNOSIS — S065X0D Traumatic subdural hemorrhage without loss of consciousness, subsequent encounter: Secondary | ICD-10-CM | POA: Diagnosis not present

## 2021-05-01 DIAGNOSIS — R4701 Aphasia: Secondary | ICD-10-CM | POA: Diagnosis not present

## 2021-05-01 DIAGNOSIS — S0240DD Maxillary fracture, left side, subsequent encounter for fracture with routine healing: Secondary | ICD-10-CM | POA: Diagnosis not present

## 2021-05-01 DIAGNOSIS — S02842D Fracture of lateral orbital wall, left side, subsequent encounter for fracture with routine healing: Secondary | ICD-10-CM | POA: Diagnosis not present

## 2021-05-01 DIAGNOSIS — M791 Myalgia, unspecified site: Secondary | ICD-10-CM | POA: Diagnosis not present

## 2021-05-01 DIAGNOSIS — E119 Type 2 diabetes mellitus without complications: Secondary | ICD-10-CM | POA: Diagnosis not present

## 2021-05-01 DIAGNOSIS — I119 Hypertensive heart disease without heart failure: Secondary | ICD-10-CM | POA: Diagnosis not present

## 2021-05-01 DIAGNOSIS — S0232XD Fracture of orbital floor, left side, subsequent encounter for fracture with routine healing: Secondary | ICD-10-CM | POA: Diagnosis not present

## 2021-05-01 DIAGNOSIS — I2510 Atherosclerotic heart disease of native coronary artery without angina pectoris: Secondary | ICD-10-CM | POA: Diagnosis not present

## 2021-05-01 DIAGNOSIS — E1142 Type 2 diabetes mellitus with diabetic polyneuropathy: Secondary | ICD-10-CM | POA: Diagnosis not present

## 2021-05-01 DIAGNOSIS — Z7982 Long term (current) use of aspirin: Secondary | ICD-10-CM | POA: Diagnosis not present

## 2021-05-01 DIAGNOSIS — K5900 Constipation, unspecified: Secondary | ICD-10-CM | POA: Diagnosis not present

## 2021-05-01 DIAGNOSIS — Z9181 History of falling: Secondary | ICD-10-CM | POA: Diagnosis not present

## 2021-05-01 DIAGNOSIS — Z7901 Long term (current) use of anticoagulants: Secondary | ICD-10-CM | POA: Diagnosis not present

## 2021-05-01 DIAGNOSIS — D509 Iron deficiency anemia, unspecified: Secondary | ICD-10-CM | POA: Diagnosis not present

## 2021-05-01 DIAGNOSIS — I700 Atherosclerosis of aorta: Secondary | ICD-10-CM | POA: Diagnosis not present

## 2021-05-01 DIAGNOSIS — M170 Bilateral primary osteoarthritis of knee: Secondary | ICD-10-CM | POA: Diagnosis not present

## 2021-05-01 DIAGNOSIS — M47812 Spondylosis without myelopathy or radiculopathy, cervical region: Secondary | ICD-10-CM | POA: Diagnosis not present

## 2021-05-01 DIAGNOSIS — M5030 Other cervical disc degeneration, unspecified cervical region: Secondary | ICD-10-CM | POA: Diagnosis not present

## 2021-05-01 DIAGNOSIS — E785 Hyperlipidemia, unspecified: Secondary | ICD-10-CM | POA: Diagnosis not present

## 2021-05-01 LAB — GLUCOSE, CAPILLARY: Glucose-Capillary: 106 mg/dL — ABNORMAL HIGH (ref 70–99)

## 2021-05-01 NOTE — Telephone Encounter (Signed)
Call from Noni Saupe, OT in the home doing evaluation to begin therapy.   During assessment notes Apple Watch reading HR 72, checking radially OT is getting 42-43 bpm.  I asked him to listen apically to the heart.  He did and HR 74 bpm.  The patient denies any sob, chest pain.  Per OT is awake alert and oriented and in no distress.    I adv it would be ok to continue the evaluation and that pt should keep his follow up next week with PCP.  I tried to schedule him with Dr. Tamala Julian as he is overdue.  I am forwarding to Dr. Thompson Caul nurse to assist and adv the pt that our office will call him with an appointment but it will likely be August or September.

## 2021-05-01 NOTE — Telephone Encounter (Signed)
STAT if HR is under 50 or over 120 (normal HR is 60-100 beats per minute)  What is your heart rate? 45-48  Do you have a log of your heart rate readings (document readings)?   Do you have any other symptoms? No complaints,  110/56  Don from [facility] is calling to report pt's heartrate

## 2021-05-03 DIAGNOSIS — I119 Hypertensive heart disease without heart failure: Secondary | ICD-10-CM | POA: Diagnosis not present

## 2021-05-03 DIAGNOSIS — S02842D Fracture of lateral orbital wall, left side, subsequent encounter for fracture with routine healing: Secondary | ICD-10-CM | POA: Diagnosis not present

## 2021-05-03 DIAGNOSIS — M5030 Other cervical disc degeneration, unspecified cervical region: Secondary | ICD-10-CM | POA: Diagnosis not present

## 2021-05-03 DIAGNOSIS — E119 Type 2 diabetes mellitus without complications: Secondary | ICD-10-CM | POA: Diagnosis not present

## 2021-05-03 DIAGNOSIS — D509 Iron deficiency anemia, unspecified: Secondary | ICD-10-CM | POA: Diagnosis not present

## 2021-05-03 DIAGNOSIS — Z7901 Long term (current) use of anticoagulants: Secondary | ICD-10-CM | POA: Diagnosis not present

## 2021-05-03 DIAGNOSIS — S0240FD Zygomatic fracture, left side, subsequent encounter for fracture with routine healing: Secondary | ICD-10-CM | POA: Diagnosis not present

## 2021-05-03 DIAGNOSIS — R4701 Aphasia: Secondary | ICD-10-CM | POA: Diagnosis not present

## 2021-05-03 DIAGNOSIS — S065X0A Traumatic subdural hemorrhage without loss of consciousness, initial encounter: Secondary | ICD-10-CM | POA: Diagnosis not present

## 2021-05-03 DIAGNOSIS — S065X0D Traumatic subdural hemorrhage without loss of consciousness, subsequent encounter: Secondary | ICD-10-CM | POA: Diagnosis not present

## 2021-05-03 DIAGNOSIS — E785 Hyperlipidemia, unspecified: Secondary | ICD-10-CM | POA: Diagnosis not present

## 2021-05-03 DIAGNOSIS — M47812 Spondylosis without myelopathy or radiculopathy, cervical region: Secondary | ICD-10-CM | POA: Diagnosis not present

## 2021-05-03 DIAGNOSIS — S0232XD Fracture of orbital floor, left side, subsequent encounter for fracture with routine healing: Secondary | ICD-10-CM | POA: Diagnosis not present

## 2021-05-03 DIAGNOSIS — M170 Bilateral primary osteoarthritis of knee: Secondary | ICD-10-CM | POA: Diagnosis not present

## 2021-05-03 DIAGNOSIS — Z7982 Long term (current) use of aspirin: Secondary | ICD-10-CM | POA: Diagnosis not present

## 2021-05-03 DIAGNOSIS — I4891 Unspecified atrial fibrillation: Secondary | ICD-10-CM | POA: Diagnosis not present

## 2021-05-03 DIAGNOSIS — Z9181 History of falling: Secondary | ICD-10-CM | POA: Diagnosis not present

## 2021-05-03 DIAGNOSIS — S0240DD Maxillary fracture, left side, subsequent encounter for fracture with routine healing: Secondary | ICD-10-CM | POA: Diagnosis not present

## 2021-05-03 DIAGNOSIS — M791 Myalgia, unspecified site: Secondary | ICD-10-CM | POA: Diagnosis not present

## 2021-05-03 DIAGNOSIS — I700 Atherosclerosis of aorta: Secondary | ICD-10-CM | POA: Diagnosis not present

## 2021-05-03 DIAGNOSIS — E1142 Type 2 diabetes mellitus with diabetic polyneuropathy: Secondary | ICD-10-CM | POA: Diagnosis not present

## 2021-05-03 DIAGNOSIS — I2510 Atherosclerotic heart disease of native coronary artery without angina pectoris: Secondary | ICD-10-CM | POA: Diagnosis not present

## 2021-05-03 DIAGNOSIS — K5900 Constipation, unspecified: Secondary | ICD-10-CM | POA: Diagnosis not present

## 2021-05-03 NOTE — Telephone Encounter (Signed)
Needs 12 lead ECG.

## 2021-05-03 NOTE — Telephone Encounter (Signed)
Called pt and scheduled him to come in and see Dr. Gasper Sells for eval of heart rhythm.  Pt agreeable to plan.

## 2021-05-04 ENCOUNTER — Encounter: Payer: Self-pay | Admitting: Internal Medicine

## 2021-05-04 ENCOUNTER — Other Ambulatory Visit: Payer: Self-pay

## 2021-05-04 ENCOUNTER — Ambulatory Visit (INDEPENDENT_AMBULATORY_CARE_PROVIDER_SITE_OTHER): Payer: Medicare Other

## 2021-05-04 ENCOUNTER — Ambulatory Visit (INDEPENDENT_AMBULATORY_CARE_PROVIDER_SITE_OTHER): Payer: Medicare Other | Admitting: Internal Medicine

## 2021-05-04 VITALS — BP 100/58 | HR 65 | Ht 72.0 in | Wt 190.4 lb

## 2021-05-04 DIAGNOSIS — I700 Atherosclerosis of aorta: Secondary | ICD-10-CM | POA: Diagnosis not present

## 2021-05-04 DIAGNOSIS — M5030 Other cervical disc degeneration, unspecified cervical region: Secondary | ICD-10-CM | POA: Diagnosis not present

## 2021-05-04 DIAGNOSIS — E7800 Pure hypercholesterolemia, unspecified: Secondary | ICD-10-CM | POA: Diagnosis not present

## 2021-05-04 DIAGNOSIS — S02842D Fracture of lateral orbital wall, left side, subsequent encounter for fracture with routine healing: Secondary | ICD-10-CM | POA: Diagnosis not present

## 2021-05-04 DIAGNOSIS — I25119 Atherosclerotic heart disease of native coronary artery with unspecified angina pectoris: Secondary | ICD-10-CM | POA: Diagnosis not present

## 2021-05-04 DIAGNOSIS — S065X9A Traumatic subdural hemorrhage with loss of consciousness of unspecified duration, initial encounter: Secondary | ICD-10-CM | POA: Diagnosis not present

## 2021-05-04 DIAGNOSIS — I2510 Atherosclerotic heart disease of native coronary artery without angina pectoris: Secondary | ICD-10-CM | POA: Diagnosis not present

## 2021-05-04 DIAGNOSIS — M170 Bilateral primary osteoarthritis of knee: Secondary | ICD-10-CM | POA: Diagnosis not present

## 2021-05-04 DIAGNOSIS — E119 Type 2 diabetes mellitus without complications: Secondary | ICD-10-CM | POA: Diagnosis not present

## 2021-05-04 DIAGNOSIS — I255 Ischemic cardiomyopathy: Secondary | ICD-10-CM

## 2021-05-04 DIAGNOSIS — I480 Paroxysmal atrial fibrillation: Secondary | ICD-10-CM

## 2021-05-04 DIAGNOSIS — K219 Gastro-esophageal reflux disease without esophagitis: Secondary | ICD-10-CM | POA: Diagnosis not present

## 2021-05-04 DIAGNOSIS — S065X0D Traumatic subdural hemorrhage without loss of consciousness, subsequent encounter: Secondary | ICD-10-CM | POA: Diagnosis not present

## 2021-05-04 DIAGNOSIS — S065X0A Traumatic subdural hemorrhage without loss of consciousness, initial encounter: Secondary | ICD-10-CM | POA: Diagnosis not present

## 2021-05-04 DIAGNOSIS — S0240DD Maxillary fracture, left side, subsequent encounter for fracture with routine healing: Secondary | ICD-10-CM | POA: Diagnosis not present

## 2021-05-04 DIAGNOSIS — E785 Hyperlipidemia, unspecified: Secondary | ICD-10-CM | POA: Diagnosis not present

## 2021-05-04 DIAGNOSIS — I119 Hypertensive heart disease without heart failure: Secondary | ICD-10-CM | POA: Diagnosis not present

## 2021-05-04 DIAGNOSIS — S065XAA Traumatic subdural hemorrhage with loss of consciousness status unknown, initial encounter: Secondary | ICD-10-CM | POA: Insufficient documentation

## 2021-05-04 DIAGNOSIS — E1140 Type 2 diabetes mellitus with diabetic neuropathy, unspecified: Secondary | ICD-10-CM | POA: Diagnosis not present

## 2021-05-04 DIAGNOSIS — Z7901 Long term (current) use of anticoagulants: Secondary | ICD-10-CM | POA: Diagnosis not present

## 2021-05-04 DIAGNOSIS — E1142 Type 2 diabetes mellitus with diabetic polyneuropathy: Secondary | ICD-10-CM | POA: Diagnosis not present

## 2021-05-04 DIAGNOSIS — Z7982 Long term (current) use of aspirin: Secondary | ICD-10-CM | POA: Diagnosis not present

## 2021-05-04 DIAGNOSIS — S0232XD Fracture of orbital floor, left side, subsequent encounter for fracture with routine healing: Secondary | ICD-10-CM | POA: Diagnosis not present

## 2021-05-04 DIAGNOSIS — Z9181 History of falling: Secondary | ICD-10-CM | POA: Diagnosis not present

## 2021-05-04 DIAGNOSIS — Z5181 Encounter for therapeutic drug level monitoring: Secondary | ICD-10-CM

## 2021-05-04 DIAGNOSIS — K5900 Constipation, unspecified: Secondary | ICD-10-CM | POA: Diagnosis not present

## 2021-05-04 DIAGNOSIS — R4701 Aphasia: Secondary | ICD-10-CM | POA: Diagnosis not present

## 2021-05-04 DIAGNOSIS — Z7984 Long term (current) use of oral hypoglycemic drugs: Secondary | ICD-10-CM | POA: Diagnosis not present

## 2021-05-04 DIAGNOSIS — M47812 Spondylosis without myelopathy or radiculopathy, cervical region: Secondary | ICD-10-CM | POA: Diagnosis not present

## 2021-05-04 DIAGNOSIS — D509 Iron deficiency anemia, unspecified: Secondary | ICD-10-CM | POA: Diagnosis not present

## 2021-05-04 DIAGNOSIS — I4891 Unspecified atrial fibrillation: Secondary | ICD-10-CM | POA: Diagnosis not present

## 2021-05-04 DIAGNOSIS — S0240FD Zygomatic fracture, left side, subsequent encounter for fracture with routine healing: Secondary | ICD-10-CM | POA: Diagnosis not present

## 2021-05-04 DIAGNOSIS — M791 Myalgia, unspecified site: Secondary | ICD-10-CM | POA: Diagnosis not present

## 2021-05-04 NOTE — Progress Notes (Unsigned)
Enrolled patient for a 14 day Zio XT monitor to be mailed to patients home  Dr. Tamala Julian to read
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
## 2021-05-04 NOTE — Patient Instructions (Addendum)
Medication Instructions:  Your physician recommends that you continue on your current medications as directed. Please refer to the Current Medication list given to you today.  *If you need a refill on your cardiac medications before your next appointment, please call your pharmacy*   Lab Work: TODAY: TSH If you have labs (blood work) drawn today and your tests are completely normal, you will receive your results only by: [place] (if you have MyChart) OR A paper copy in the mail If you have any lab test that is abnormal or we need to change your treatment, we will call you to review the results.   Testing/Procedures: Your physician has recommended that you have a pulmonary function test. Pulmonary Function Tests are a group of tests that measure how well air moves in and out of your lungs.   Your physician has recommended that you wear a 14 day heart monitor.   Follow-Up: At [facility], you and your health needs are our priority.  As part of our continuing mission to provide you with exceptional heart care, we have created designated Provider Care Teams.  These Care Teams include your primary Cardiologist (physician) and Advanced Practice Providers (APPs -  Physician Assistants and Nurse Practitioners) who all work together to provide you with the care you need, when you need it.  We recommend signing up for the patient portal called "MyChart".  Sign up information is provided on this After Visit Summary.  MyChart is used to connect with patients for Virtual Visits (Telemedicine).  Patients are able to view lab/test results, encounter notes, upcoming appointments, etc.  Non-urgent messages can be sent to your provider as well.   To learn more about what you can do with MyChart, go to [email or web address].    Your next appointment:   4-5 month(s)  The format for your next appointment:   In Person  Provider:   You may see Sinclair Grooms, MD or one of the following  Advanced Practice Providers on your designated Care Team:   Cecilie Kicks, NP   Other Instructions [place] Monitor Instructions  Your physician has requested you wear a ZIO patch monitor for 14 days.  This is a single patch monitor. [employer] supplies one patch monitor per enrollment. Additional stickers are not available. Please do not apply patch if you will be having a Nuclear Stress Test,  Echocardiogram, Cardiac CT, MRI, or Chest Xray during the period you would be wearing the  monitor. The patch cannot be worn during these tests. You cannot remove and re-apply the  ZIO XT patch monitor.  Your ZIO patch monitor will be mailed 3 day USPS to your address on file. It may take 3-5 days  to receive your monitor after you have been enrolled.  Once you have received your monitor, please review the enclosed instructions. Your monitor  has already been registered assigning a specific monitor serial # to you.  Billing and Patient Assistance Program Information  We have supplied [employer] with any of your insurance information on file for billing purposes. [employer] offers a sliding scale Patient Assistance Program for patients that do not have  insurance, or whose insurance does not completely cover the cost of the ZIO monitor.  You must apply for the Patient Assistance Program to qualify for this discounted rate.  To apply, please call [employer] at [phone], select option 4, select option 2, ask to apply for  Patient Assistance Program. Theodore Demark will ask your household income,  and how many people  are in your household. They will quote your out-of-pocket cost based on that information.  [employer] will also be able to set up a 52-month, interest-free payment plan if needed.  Applying the monitor   Shave hair from upper left chest.  Hold abrader disc by orange tab. Rub abrader in 40 strokes over the upper left chest as  indicated in your monitor instructions.  Clean area with 4 enclosed  alcohol pads. Let dry.  Apply patch as indicated in monitor instructions. Patch will be placed under collarbone on left  side of chest with arrow pointing upward.  Rub patch adhesive wings for 2 minutes. Remove white label marked "1". Remove the white  label marked "2". Rub patch adhesive wings for 2 additional minutes.  While looking in a mirror, press and release button in center of patch. A small green light will  flash 3-4 times. This will be your only indicator that the monitor has been turned on.  Do not shower for the first 24 hours. You may shower after the first 24 hours.  Press the button if you feel a symptom. You will hear a small click. Record Date, Time and  Symptom in the Patient Logbook.  When you are ready to remove the patch, follow instructions on the last 2 pages of Patient  Logbook. Stick patch monitor onto the last page of Patient Logbook.  Place Patient Logbook in the blue and white box. Use locking tab on box and tape box closed  securely. The blue and white box has prepaid postage on it. Please place it in the mailbox as  soon as possible. Your physician should have your test results approximately 7 days after the  monitor has been mailed back to [facility].  Call [place] at [phone] if you have questions regarding  your ZIO XT patch monitor. Call them immediately if you see an orange light blinking on your  monitor.  If your monitor falls off in less than 4 days, contact our Monitor department at [phone].  If your monitor becomes loose or falls off after 4 days call [employer] at [phone] for  suggestions on securing your monitor

## 2021-05-05 LAB — TSH: TSH: 1.06 u[IU]/mL (ref 0.450–4.500)

## 2021-05-07 DIAGNOSIS — M791 Myalgia, unspecified site: Secondary | ICD-10-CM | POA: Diagnosis not present

## 2021-05-07 DIAGNOSIS — Z9181 History of falling: Secondary | ICD-10-CM | POA: Diagnosis not present

## 2021-05-07 DIAGNOSIS — I119 Hypertensive heart disease without heart failure: Secondary | ICD-10-CM | POA: Diagnosis not present

## 2021-05-07 DIAGNOSIS — S065X0D Traumatic subdural hemorrhage without loss of consciousness, subsequent encounter: Secondary | ICD-10-CM | POA: Diagnosis not present

## 2021-05-07 DIAGNOSIS — S0232XD Fracture of orbital floor, left side, subsequent encounter for fracture with routine healing: Secondary | ICD-10-CM | POA: Diagnosis not present

## 2021-05-07 DIAGNOSIS — M47812 Spondylosis without myelopathy or radiculopathy, cervical region: Secondary | ICD-10-CM | POA: Diagnosis not present

## 2021-05-07 DIAGNOSIS — Z7982 Long term (current) use of aspirin: Secondary | ICD-10-CM | POA: Diagnosis not present

## 2021-05-07 DIAGNOSIS — K5900 Constipation, unspecified: Secondary | ICD-10-CM | POA: Diagnosis not present

## 2021-05-07 DIAGNOSIS — Z7901 Long term (current) use of anticoagulants: Secondary | ICD-10-CM | POA: Diagnosis not present

## 2021-05-07 DIAGNOSIS — S0240FD Zygomatic fracture, left side, subsequent encounter for fracture with routine healing: Secondary | ICD-10-CM | POA: Diagnosis not present

## 2021-05-07 DIAGNOSIS — E1142 Type 2 diabetes mellitus with diabetic polyneuropathy: Secondary | ICD-10-CM | POA: Diagnosis not present

## 2021-05-07 DIAGNOSIS — E119 Type 2 diabetes mellitus without complications: Secondary | ICD-10-CM | POA: Diagnosis not present

## 2021-05-07 DIAGNOSIS — I4891 Unspecified atrial fibrillation: Secondary | ICD-10-CM | POA: Diagnosis not present

## 2021-05-07 DIAGNOSIS — R4701 Aphasia: Secondary | ICD-10-CM | POA: Diagnosis not present

## 2021-05-07 DIAGNOSIS — M5030 Other cervical disc degeneration, unspecified cervical region: Secondary | ICD-10-CM | POA: Diagnosis not present

## 2021-05-07 DIAGNOSIS — S065X0A Traumatic subdural hemorrhage without loss of consciousness, initial encounter: Secondary | ICD-10-CM | POA: Diagnosis not present

## 2021-05-07 DIAGNOSIS — S0240DD Maxillary fracture, left side, subsequent encounter for fracture with routine healing: Secondary | ICD-10-CM | POA: Diagnosis not present

## 2021-05-07 DIAGNOSIS — E785 Hyperlipidemia, unspecified: Secondary | ICD-10-CM | POA: Diagnosis not present

## 2021-05-07 DIAGNOSIS — S02842D Fracture of lateral orbital wall, left side, subsequent encounter for fracture with routine healing: Secondary | ICD-10-CM | POA: Diagnosis not present

## 2021-05-07 DIAGNOSIS — M170 Bilateral primary osteoarthritis of knee: Secondary | ICD-10-CM | POA: Diagnosis not present

## 2021-05-07 DIAGNOSIS — I2510 Atherosclerotic heart disease of native coronary artery without angina pectoris: Secondary | ICD-10-CM | POA: Diagnosis not present

## 2021-05-07 DIAGNOSIS — I700 Atherosclerosis of aorta: Secondary | ICD-10-CM | POA: Diagnosis not present

## 2021-05-07 DIAGNOSIS — D509 Iron deficiency anemia, unspecified: Secondary | ICD-10-CM | POA: Diagnosis not present

## 2021-05-09 DIAGNOSIS — E785 Hyperlipidemia, unspecified: Secondary | ICD-10-CM | POA: Diagnosis not present

## 2021-05-09 DIAGNOSIS — S0232XD Fracture of orbital floor, left side, subsequent encounter for fracture with routine healing: Secondary | ICD-10-CM | POA: Diagnosis not present

## 2021-05-09 DIAGNOSIS — M1710 Unilateral primary osteoarthritis, unspecified knee: Secondary | ICD-10-CM | POA: Diagnosis not present

## 2021-05-09 DIAGNOSIS — I700 Atherosclerosis of aorta: Secondary | ICD-10-CM | POA: Diagnosis not present

## 2021-05-09 DIAGNOSIS — E1142 Type 2 diabetes mellitus with diabetic polyneuropathy: Secondary | ICD-10-CM | POA: Diagnosis not present

## 2021-05-09 DIAGNOSIS — M5030 Other cervical disc degeneration, unspecified cervical region: Secondary | ICD-10-CM | POA: Diagnosis not present

## 2021-05-09 DIAGNOSIS — E119 Type 2 diabetes mellitus without complications: Secondary | ICD-10-CM | POA: Diagnosis not present

## 2021-05-09 DIAGNOSIS — Z9181 History of falling: Secondary | ICD-10-CM | POA: Diagnosis not present

## 2021-05-09 DIAGNOSIS — S02842D Fracture of lateral orbital wall, left side, subsequent encounter for fracture with routine healing: Secondary | ICD-10-CM | POA: Diagnosis not present

## 2021-05-09 DIAGNOSIS — S065X0D Traumatic subdural hemorrhage without loss of consciousness, subsequent encounter: Secondary | ICD-10-CM | POA: Diagnosis not present

## 2021-05-09 DIAGNOSIS — I4891 Unspecified atrial fibrillation: Secondary | ICD-10-CM | POA: Diagnosis not present

## 2021-05-09 DIAGNOSIS — R4701 Aphasia: Secondary | ICD-10-CM | POA: Diagnosis not present

## 2021-05-09 DIAGNOSIS — S0240FD Zygomatic fracture, left side, subsequent encounter for fracture with routine healing: Secondary | ICD-10-CM | POA: Diagnosis not present

## 2021-05-09 DIAGNOSIS — Z7982 Long term (current) use of aspirin: Secondary | ICD-10-CM | POA: Diagnosis not present

## 2021-05-09 DIAGNOSIS — S0240DD Maxillary fracture, left side, subsequent encounter for fracture with routine healing: Secondary | ICD-10-CM | POA: Diagnosis not present

## 2021-05-09 DIAGNOSIS — K5900 Constipation, unspecified: Secondary | ICD-10-CM | POA: Diagnosis not present

## 2021-05-09 DIAGNOSIS — D509 Iron deficiency anemia, unspecified: Secondary | ICD-10-CM | POA: Diagnosis not present

## 2021-05-09 DIAGNOSIS — M791 Myalgia, unspecified site: Secondary | ICD-10-CM | POA: Diagnosis not present

## 2021-05-09 DIAGNOSIS — M170 Bilateral primary osteoarthritis of knee: Secondary | ICD-10-CM | POA: Diagnosis not present

## 2021-05-09 DIAGNOSIS — M47812 Spondylosis without myelopathy or radiculopathy, cervical region: Secondary | ICD-10-CM | POA: Diagnosis not present

## 2021-05-09 DIAGNOSIS — I119 Hypertensive heart disease without heart failure: Secondary | ICD-10-CM | POA: Diagnosis not present

## 2021-05-09 DIAGNOSIS — S065X0A Traumatic subdural hemorrhage without loss of consciousness, initial encounter: Secondary | ICD-10-CM | POA: Diagnosis not present

## 2021-05-09 DIAGNOSIS — I2510 Atherosclerotic heart disease of native coronary artery without angina pectoris: Secondary | ICD-10-CM | POA: Diagnosis not present

## 2021-05-09 DIAGNOSIS — Z7901 Long term (current) use of anticoagulants: Secondary | ICD-10-CM | POA: Diagnosis not present

## 2021-05-10 ENCOUNTER — Encounter: Payer: Medicare Other | Admitting: Registered Nurse

## 2021-05-10 DIAGNOSIS — E7800 Pure hypercholesterolemia, unspecified: Secondary | ICD-10-CM | POA: Diagnosis not present

## 2021-05-10 DIAGNOSIS — E1140 Type 2 diabetes mellitus with diabetic neuropathy, unspecified: Secondary | ICD-10-CM | POA: Diagnosis not present

## 2021-05-10 DIAGNOSIS — I5042 Chronic combined systolic (congestive) and diastolic (congestive) heart failure: Secondary | ICD-10-CM | POA: Diagnosis not present

## 2021-05-10 DIAGNOSIS — I252 Old myocardial infarction: Secondary | ICD-10-CM | POA: Diagnosis not present

## 2021-05-10 DIAGNOSIS — K219 Gastro-esophageal reflux disease without esophagitis: Secondary | ICD-10-CM | POA: Diagnosis not present

## 2021-05-10 DIAGNOSIS — I25119 Atherosclerotic heart disease of native coronary artery with unspecified angina pectoris: Secondary | ICD-10-CM | POA: Diagnosis not present

## 2021-05-10 DIAGNOSIS — I480 Paroxysmal atrial fibrillation: Secondary | ICD-10-CM | POA: Diagnosis not present

## 2021-05-10 DIAGNOSIS — I10 Essential (primary) hypertension: Secondary | ICD-10-CM | POA: Diagnosis not present

## 2021-05-10 DIAGNOSIS — E1165 Type 2 diabetes mellitus with hyperglycemia: Secondary | ICD-10-CM | POA: Diagnosis not present

## 2021-05-10 DIAGNOSIS — I4891 Unspecified atrial fibrillation: Secondary | ICD-10-CM | POA: Diagnosis not present

## 2021-05-14 DIAGNOSIS — M791 Myalgia, unspecified site: Secondary | ICD-10-CM | POA: Diagnosis not present

## 2021-05-14 DIAGNOSIS — S065X0A Traumatic subdural hemorrhage without loss of consciousness, initial encounter: Secondary | ICD-10-CM | POA: Diagnosis not present

## 2021-05-14 DIAGNOSIS — R4701 Aphasia: Secondary | ICD-10-CM | POA: Diagnosis not present

## 2021-05-14 DIAGNOSIS — E119 Type 2 diabetes mellitus without complications: Secondary | ICD-10-CM | POA: Diagnosis not present

## 2021-05-14 DIAGNOSIS — I4891 Unspecified atrial fibrillation: Secondary | ICD-10-CM | POA: Diagnosis not present

## 2021-05-14 DIAGNOSIS — Z9181 History of falling: Secondary | ICD-10-CM | POA: Diagnosis not present

## 2021-05-14 DIAGNOSIS — M5030 Other cervical disc degeneration, unspecified cervical region: Secondary | ICD-10-CM | POA: Diagnosis not present

## 2021-05-14 DIAGNOSIS — Z7901 Long term (current) use of anticoagulants: Secondary | ICD-10-CM | POA: Diagnosis not present

## 2021-05-14 DIAGNOSIS — S02842D Fracture of lateral orbital wall, left side, subsequent encounter for fracture with routine healing: Secondary | ICD-10-CM | POA: Diagnosis not present

## 2021-05-14 DIAGNOSIS — I2510 Atherosclerotic heart disease of native coronary artery without angina pectoris: Secondary | ICD-10-CM | POA: Diagnosis not present

## 2021-05-14 DIAGNOSIS — S0240DD Maxillary fracture, left side, subsequent encounter for fracture with routine healing: Secondary | ICD-10-CM | POA: Diagnosis not present

## 2021-05-14 DIAGNOSIS — I700 Atherosclerosis of aorta: Secondary | ICD-10-CM | POA: Diagnosis not present

## 2021-05-14 DIAGNOSIS — S0240FD Zygomatic fracture, left side, subsequent encounter for fracture with routine healing: Secondary | ICD-10-CM | POA: Diagnosis not present

## 2021-05-14 DIAGNOSIS — S0232XD Fracture of orbital floor, left side, subsequent encounter for fracture with routine healing: Secondary | ICD-10-CM | POA: Diagnosis not present

## 2021-05-14 DIAGNOSIS — M170 Bilateral primary osteoarthritis of knee: Secondary | ICD-10-CM | POA: Diagnosis not present

## 2021-05-14 DIAGNOSIS — Z7982 Long term (current) use of aspirin: Secondary | ICD-10-CM | POA: Diagnosis not present

## 2021-05-14 DIAGNOSIS — E785 Hyperlipidemia, unspecified: Secondary | ICD-10-CM | POA: Diagnosis not present

## 2021-05-14 DIAGNOSIS — E1142 Type 2 diabetes mellitus with diabetic polyneuropathy: Secondary | ICD-10-CM | POA: Diagnosis not present

## 2021-05-14 DIAGNOSIS — M47812 Spondylosis without myelopathy or radiculopathy, cervical region: Secondary | ICD-10-CM | POA: Diagnosis not present

## 2021-05-14 DIAGNOSIS — D509 Iron deficiency anemia, unspecified: Secondary | ICD-10-CM | POA: Diagnosis not present

## 2021-05-14 DIAGNOSIS — I119 Hypertensive heart disease without heart failure: Secondary | ICD-10-CM | POA: Diagnosis not present

## 2021-05-14 DIAGNOSIS — K5900 Constipation, unspecified: Secondary | ICD-10-CM | POA: Diagnosis not present

## 2021-05-14 DIAGNOSIS — S065X0D Traumatic subdural hemorrhage without loss of consciousness, subsequent encounter: Secondary | ICD-10-CM | POA: Diagnosis not present

## 2021-05-15 ENCOUNTER — Other Ambulatory Visit: Payer: Self-pay

## 2021-05-15 ENCOUNTER — Ambulatory Visit (INDEPENDENT_AMBULATORY_CARE_PROVIDER_SITE_OTHER): Payer: Medicare Other | Admitting: Internal Medicine

## 2021-05-15 DIAGNOSIS — Z5181 Encounter for therapeutic drug level monitoring: Secondary | ICD-10-CM | POA: Diagnosis not present

## 2021-05-15 DIAGNOSIS — I480 Paroxysmal atrial fibrillation: Secondary | ICD-10-CM

## 2021-05-15 LAB — PULMONARY FUNCTION TEST
DL/VA % pred: 90 %
DL/VA: 3.46 ml/min/mmHg/L
DLCO cor % pred: 76 %
DLCO cor: 19.34 ml/min/mmHg
DLCO unc % pred: 76 %
DLCO unc: 19.34 ml/min/mmHg
FEF 25-75 Post: 2.35 L/sec
FEF 25-75 Pre: 2.26 L/sec
FEF2575-%Change-Post: 3 %
FEF2575-%Pred-Post: 116 %
FEF2575-%Pred-Pre: 112 %
FEV1-%Change-Post: 0 %
FEV1-%Pred-Post: 94 %
FEV1-%Pred-Pre: 94 %
FEV1-Post: 2.8 L
FEV1-Pre: 2.82 L
FEV1FVC-%Change-Post: 5 %
FEV1FVC-%Pred-Pre: 108 %
FEV6-%Change-Post: -5 %
FEV6-%Pred-Post: 88 %
FEV6-%Pred-Pre: 93 %
FEV6-Post: 3.45 L
FEV6-Pre: 3.65 L
FEV6FVC-%Change-Post: 0 %
FEV6FVC-%Pred-Post: 107 %
FEV6FVC-%Pred-Pre: 106 %
FVC-%Change-Post: -5 %
FVC-%Pred-Post: 82 %
FVC-%Pred-Pre: 87 %
FVC-Post: 3.45 L
FVC-Pre: 3.67 L
Post FEV1/FVC ratio: 81 %
Post FEV6/FVC ratio: 100 %
Pre FEV1/FVC ratio: 77 %
Pre FEV6/FVC Ratio: 99 %
RV % pred: 128 %
RV: 3.57 L
TLC % pred: 99 %
TLC: 7.3 L

## 2021-05-15 NOTE — Progress Notes (Signed)
PFT done today. 

## 2021-05-16 DIAGNOSIS — Z7901 Long term (current) use of anticoagulants: Secondary | ICD-10-CM | POA: Diagnosis not present

## 2021-05-16 DIAGNOSIS — M5030 Other cervical disc degeneration, unspecified cervical region: Secondary | ICD-10-CM | POA: Diagnosis not present

## 2021-05-16 DIAGNOSIS — S0232XD Fracture of orbital floor, left side, subsequent encounter for fracture with routine healing: Secondary | ICD-10-CM | POA: Diagnosis not present

## 2021-05-16 DIAGNOSIS — I480 Paroxysmal atrial fibrillation: Secondary | ICD-10-CM

## 2021-05-16 DIAGNOSIS — K5900 Constipation, unspecified: Secondary | ICD-10-CM | POA: Diagnosis not present

## 2021-05-16 DIAGNOSIS — R4701 Aphasia: Secondary | ICD-10-CM | POA: Diagnosis not present

## 2021-05-16 DIAGNOSIS — D509 Iron deficiency anemia, unspecified: Secondary | ICD-10-CM | POA: Diagnosis not present

## 2021-05-16 DIAGNOSIS — S0240DD Maxillary fracture, left side, subsequent encounter for fracture with routine healing: Secondary | ICD-10-CM | POA: Diagnosis not present

## 2021-05-16 DIAGNOSIS — M170 Bilateral primary osteoarthritis of knee: Secondary | ICD-10-CM | POA: Diagnosis not present

## 2021-05-16 DIAGNOSIS — E785 Hyperlipidemia, unspecified: Secondary | ICD-10-CM | POA: Diagnosis not present

## 2021-05-16 DIAGNOSIS — I700 Atherosclerosis of aorta: Secondary | ICD-10-CM | POA: Diagnosis not present

## 2021-05-16 DIAGNOSIS — Z7982 Long term (current) use of aspirin: Secondary | ICD-10-CM | POA: Diagnosis not present

## 2021-05-16 DIAGNOSIS — E1142 Type 2 diabetes mellitus with diabetic polyneuropathy: Secondary | ICD-10-CM | POA: Diagnosis not present

## 2021-05-16 DIAGNOSIS — E119 Type 2 diabetes mellitus without complications: Secondary | ICD-10-CM | POA: Diagnosis not present

## 2021-05-16 DIAGNOSIS — I4891 Unspecified atrial fibrillation: Secondary | ICD-10-CM | POA: Diagnosis not present

## 2021-05-16 DIAGNOSIS — S02842D Fracture of lateral orbital wall, left side, subsequent encounter for fracture with routine healing: Secondary | ICD-10-CM | POA: Diagnosis not present

## 2021-05-16 DIAGNOSIS — Z9181 History of falling: Secondary | ICD-10-CM | POA: Diagnosis not present

## 2021-05-16 DIAGNOSIS — I119 Hypertensive heart disease without heart failure: Secondary | ICD-10-CM | POA: Diagnosis not present

## 2021-05-16 DIAGNOSIS — S065X0D Traumatic subdural hemorrhage without loss of consciousness, subsequent encounter: Secondary | ICD-10-CM | POA: Diagnosis not present

## 2021-05-16 DIAGNOSIS — S065X0A Traumatic subdural hemorrhage without loss of consciousness, initial encounter: Secondary | ICD-10-CM | POA: Diagnosis not present

## 2021-05-16 DIAGNOSIS — I2510 Atherosclerotic heart disease of native coronary artery without angina pectoris: Secondary | ICD-10-CM | POA: Diagnosis not present

## 2021-05-16 DIAGNOSIS — S0240FD Zygomatic fracture, left side, subsequent encounter for fracture with routine healing: Secondary | ICD-10-CM | POA: Diagnosis not present

## 2021-05-16 DIAGNOSIS — M47812 Spondylosis without myelopathy or radiculopathy, cervical region: Secondary | ICD-10-CM | POA: Diagnosis not present

## 2021-05-16 DIAGNOSIS — M791 Myalgia, unspecified site: Secondary | ICD-10-CM | POA: Diagnosis not present

## 2021-05-17 ENCOUNTER — Other Ambulatory Visit: Payer: Self-pay

## 2021-05-17 ENCOUNTER — Encounter: Payer: Medicare Other | Attending: Registered Nurse | Admitting: Registered Nurse

## 2021-05-17 VITALS — BP 122/63 | HR 64 | Temp 98.3°F | Ht 72.0 in | Wt 188.8 lb

## 2021-05-17 DIAGNOSIS — Z09 Encounter for follow-up examination after completed treatment for conditions other than malignant neoplasm: Secondary | ICD-10-CM | POA: Diagnosis not present

## 2021-05-17 DIAGNOSIS — Z7901 Long term (current) use of anticoagulants: Secondary | ICD-10-CM | POA: Diagnosis not present

## 2021-05-17 DIAGNOSIS — I480 Paroxysmal atrial fibrillation: Secondary | ICD-10-CM

## 2021-05-17 DIAGNOSIS — S065X9D Traumatic subdural hemorrhage with loss of consciousness of unspecified duration, subsequent encounter: Secondary | ICD-10-CM | POA: Diagnosis not present

## 2021-05-17 DIAGNOSIS — S0003XD Contusion of scalp, subsequent encounter: Secondary | ICD-10-CM | POA: Insufficient documentation

## 2021-05-17 DIAGNOSIS — I2510 Atherosclerotic heart disease of native coronary artery without angina pectoris: Secondary | ICD-10-CM | POA: Insufficient documentation

## 2021-05-17 DIAGNOSIS — S065XAA Traumatic subdural hemorrhage with loss of consciousness status unknown, initial encounter: Secondary | ICD-10-CM

## 2021-05-17 DIAGNOSIS — S065X9A Traumatic subdural hemorrhage with loss of consciousness of unspecified duration, initial encounter: Secondary | ICD-10-CM | POA: Diagnosis not present

## 2021-05-17 DIAGNOSIS — I700 Atherosclerosis of aorta: Secondary | ICD-10-CM | POA: Insufficient documentation
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
## 2021-05-18 DIAGNOSIS — E785 Hyperlipidemia, unspecified: Secondary | ICD-10-CM | POA: Diagnosis not present

## 2021-05-18 DIAGNOSIS — E1142 Type 2 diabetes mellitus with diabetic polyneuropathy: Secondary | ICD-10-CM | POA: Diagnosis not present

## 2021-05-18 DIAGNOSIS — I2510 Atherosclerotic heart disease of native coronary artery without angina pectoris: Secondary | ICD-10-CM | POA: Diagnosis not present

## 2021-05-18 DIAGNOSIS — M47812 Spondylosis without myelopathy or radiculopathy, cervical region: Secondary | ICD-10-CM | POA: Diagnosis not present

## 2021-05-18 DIAGNOSIS — M170 Bilateral primary osteoarthritis of knee: Secondary | ICD-10-CM | POA: Diagnosis not present

## 2021-05-18 DIAGNOSIS — Z9181 History of falling: Secondary | ICD-10-CM | POA: Diagnosis not present

## 2021-05-18 DIAGNOSIS — D509 Iron deficiency anemia, unspecified: Secondary | ICD-10-CM | POA: Diagnosis not present

## 2021-05-18 DIAGNOSIS — S0232XD Fracture of orbital floor, left side, subsequent encounter for fracture with routine healing: Secondary | ICD-10-CM | POA: Diagnosis not present

## 2021-05-18 DIAGNOSIS — E119 Type 2 diabetes mellitus without complications: Secondary | ICD-10-CM | POA: Diagnosis not present

## 2021-05-18 DIAGNOSIS — S065X0D Traumatic subdural hemorrhage without loss of consciousness, subsequent encounter: Secondary | ICD-10-CM | POA: Diagnosis not present

## 2021-05-18 DIAGNOSIS — S02842D Fracture of lateral orbital wall, left side, subsequent encounter for fracture with routine healing: Secondary | ICD-10-CM | POA: Diagnosis not present

## 2021-05-18 DIAGNOSIS — S0240FD Zygomatic fracture, left side, subsequent encounter for fracture with routine healing: Secondary | ICD-10-CM | POA: Diagnosis not present

## 2021-05-18 DIAGNOSIS — M791 Myalgia, unspecified site: Secondary | ICD-10-CM | POA: Diagnosis not present

## 2021-05-18 DIAGNOSIS — I4891 Unspecified atrial fibrillation: Secondary | ICD-10-CM | POA: Diagnosis not present

## 2021-05-18 DIAGNOSIS — I119 Hypertensive heart disease without heart failure: Secondary | ICD-10-CM | POA: Diagnosis not present

## 2021-05-18 DIAGNOSIS — Z7901 Long term (current) use of anticoagulants: Secondary | ICD-10-CM | POA: Diagnosis not present

## 2021-05-18 DIAGNOSIS — S0240DD Maxillary fracture, left side, subsequent encounter for fracture with routine healing: Secondary | ICD-10-CM | POA: Diagnosis not present

## 2021-05-18 DIAGNOSIS — I700 Atherosclerosis of aorta: Secondary | ICD-10-CM | POA: Diagnosis not present

## 2021-05-18 DIAGNOSIS — S065X0A Traumatic subdural hemorrhage without loss of consciousness, initial encounter: Secondary | ICD-10-CM | POA: Diagnosis not present

## 2021-05-18 DIAGNOSIS — R4701 Aphasia: Secondary | ICD-10-CM | POA: Diagnosis not present

## 2021-05-18 DIAGNOSIS — M5030 Other cervical disc degeneration, unspecified cervical region: Secondary | ICD-10-CM | POA: Diagnosis not present

## 2021-05-18 DIAGNOSIS — K5900 Constipation, unspecified: Secondary | ICD-10-CM | POA: Diagnosis not present

## 2021-05-18 DIAGNOSIS — Z7982 Long term (current) use of aspirin: Secondary | ICD-10-CM | POA: Diagnosis not present

## 2021-05-21 DIAGNOSIS — S0240FD Zygomatic fracture, left side, subsequent encounter for fracture with routine healing: Secondary | ICD-10-CM | POA: Diagnosis not present

## 2021-05-21 DIAGNOSIS — D509 Iron deficiency anemia, unspecified: Secondary | ICD-10-CM | POA: Diagnosis not present

## 2021-05-21 DIAGNOSIS — K5900 Constipation, unspecified: Secondary | ICD-10-CM | POA: Diagnosis not present

## 2021-05-21 DIAGNOSIS — S02842D Fracture of lateral orbital wall, left side, subsequent encounter for fracture with routine healing: Secondary | ICD-10-CM | POA: Diagnosis not present

## 2021-05-21 DIAGNOSIS — Z7982 Long term (current) use of aspirin: Secondary | ICD-10-CM | POA: Diagnosis not present

## 2021-05-21 DIAGNOSIS — M791 Myalgia, unspecified site: Secondary | ICD-10-CM | POA: Diagnosis not present

## 2021-05-21 DIAGNOSIS — R4701 Aphasia: Secondary | ICD-10-CM | POA: Diagnosis not present

## 2021-05-21 DIAGNOSIS — I4891 Unspecified atrial fibrillation: Secondary | ICD-10-CM | POA: Diagnosis not present

## 2021-05-21 DIAGNOSIS — Z9181 History of falling: Secondary | ICD-10-CM | POA: Diagnosis not present

## 2021-05-21 DIAGNOSIS — I2510 Atherosclerotic heart disease of native coronary artery without angina pectoris: Secondary | ICD-10-CM | POA: Diagnosis not present

## 2021-05-21 DIAGNOSIS — S065X0A Traumatic subdural hemorrhage without loss of consciousness, initial encounter: Secondary | ICD-10-CM | POA: Diagnosis not present

## 2021-05-21 DIAGNOSIS — S0240DD Maxillary fracture, left side, subsequent encounter for fracture with routine healing: Secondary | ICD-10-CM | POA: Diagnosis not present

## 2021-05-21 DIAGNOSIS — H532 Diplopia: Secondary | ICD-10-CM | POA: Diagnosis not present

## 2021-05-21 DIAGNOSIS — I700 Atherosclerosis of aorta: Secondary | ICD-10-CM | POA: Diagnosis not present

## 2021-05-21 DIAGNOSIS — E1142 Type 2 diabetes mellitus with diabetic polyneuropathy: Secondary | ICD-10-CM | POA: Diagnosis not present

## 2021-05-21 DIAGNOSIS — M170 Bilateral primary osteoarthritis of knee: Secondary | ICD-10-CM | POA: Diagnosis not present

## 2021-05-21 DIAGNOSIS — I119 Hypertensive heart disease without heart failure: Secondary | ICD-10-CM | POA: Diagnosis not present

## 2021-05-21 DIAGNOSIS — S065X0D Traumatic subdural hemorrhage without loss of consciousness, subsequent encounter: Secondary | ICD-10-CM | POA: Diagnosis not present

## 2021-05-21 DIAGNOSIS — M47812 Spondylosis without myelopathy or radiculopathy, cervical region: Secondary | ICD-10-CM | POA: Diagnosis not present

## 2021-05-21 DIAGNOSIS — E785 Hyperlipidemia, unspecified: Secondary | ICD-10-CM | POA: Diagnosis not present

## 2021-05-21 DIAGNOSIS — M5030 Other cervical disc degeneration, unspecified cervical region: Secondary | ICD-10-CM | POA: Diagnosis not present

## 2021-05-21 DIAGNOSIS — E119 Type 2 diabetes mellitus without complications: Secondary | ICD-10-CM | POA: Diagnosis not present

## 2021-05-21 DIAGNOSIS — Z7901 Long term (current) use of anticoagulants: Secondary | ICD-10-CM | POA: Diagnosis not present

## 2021-05-21 DIAGNOSIS — S0232XD Fracture of orbital floor, left side, subsequent encounter for fracture with routine healing: Secondary | ICD-10-CM | POA: Diagnosis not present

## 2021-05-22 ENCOUNTER — Encounter: Payer: Self-pay | Admitting: Registered Nurse

## 2021-05-23 DIAGNOSIS — S065X0A Traumatic subdural hemorrhage without loss of consciousness, initial encounter: Secondary | ICD-10-CM | POA: Diagnosis not present

## 2021-05-23 DIAGNOSIS — M170 Bilateral primary osteoarthritis of knee: Secondary | ICD-10-CM | POA: Diagnosis not present

## 2021-05-23 DIAGNOSIS — E119 Type 2 diabetes mellitus without complications: Secondary | ICD-10-CM | POA: Diagnosis not present

## 2021-05-23 DIAGNOSIS — K5900 Constipation, unspecified: Secondary | ICD-10-CM | POA: Diagnosis not present

## 2021-05-23 DIAGNOSIS — S0240FD Zygomatic fracture, left side, subsequent encounter for fracture with routine healing: Secondary | ICD-10-CM | POA: Diagnosis not present

## 2021-05-23 DIAGNOSIS — E785 Hyperlipidemia, unspecified: Secondary | ICD-10-CM | POA: Diagnosis not present

## 2021-05-23 DIAGNOSIS — Z7982 Long term (current) use of aspirin: Secondary | ICD-10-CM | POA: Diagnosis not present

## 2021-05-23 DIAGNOSIS — S0232XD Fracture of orbital floor, left side, subsequent encounter for fracture with routine healing: Secondary | ICD-10-CM | POA: Diagnosis not present

## 2021-05-23 DIAGNOSIS — I700 Atherosclerosis of aorta: Secondary | ICD-10-CM | POA: Diagnosis not present

## 2021-05-23 DIAGNOSIS — E1142 Type 2 diabetes mellitus with diabetic polyneuropathy: Secondary | ICD-10-CM | POA: Diagnosis not present

## 2021-05-23 DIAGNOSIS — M5030 Other cervical disc degeneration, unspecified cervical region: Secondary | ICD-10-CM | POA: Diagnosis not present

## 2021-05-23 DIAGNOSIS — M47812 Spondylosis without myelopathy or radiculopathy, cervical region: Secondary | ICD-10-CM | POA: Diagnosis not present

## 2021-05-23 DIAGNOSIS — R4701 Aphasia: Secondary | ICD-10-CM | POA: Diagnosis not present

## 2021-05-23 DIAGNOSIS — Z7901 Long term (current) use of anticoagulants: Secondary | ICD-10-CM | POA: Diagnosis not present

## 2021-05-23 DIAGNOSIS — Z9181 History of falling: Secondary | ICD-10-CM | POA: Diagnosis not present

## 2021-05-23 DIAGNOSIS — I4891 Unspecified atrial fibrillation: Secondary | ICD-10-CM | POA: Diagnosis not present

## 2021-05-23 DIAGNOSIS — I2510 Atherosclerotic heart disease of native coronary artery without angina pectoris: Secondary | ICD-10-CM | POA: Diagnosis not present

## 2021-05-23 DIAGNOSIS — S065X0D Traumatic subdural hemorrhage without loss of consciousness, subsequent encounter: Secondary | ICD-10-CM | POA: Diagnosis not present

## 2021-05-23 DIAGNOSIS — D509 Iron deficiency anemia, unspecified: Secondary | ICD-10-CM | POA: Diagnosis not present

## 2021-05-23 DIAGNOSIS — S02842D Fracture of lateral orbital wall, left side, subsequent encounter for fracture with routine healing: Secondary | ICD-10-CM | POA: Diagnosis not present

## 2021-05-23 DIAGNOSIS — I119 Hypertensive heart disease without heart failure: Secondary | ICD-10-CM | POA: Diagnosis not present

## 2021-05-23 DIAGNOSIS — S0240DD Maxillary fracture, left side, subsequent encounter for fracture with routine healing: Secondary | ICD-10-CM | POA: Diagnosis not present

## 2021-05-23 DIAGNOSIS — M791 Myalgia, unspecified site: Secondary | ICD-10-CM | POA: Diagnosis not present

## 2021-05-28 DIAGNOSIS — S065X9A Traumatic subdural hemorrhage with loss of consciousness of unspecified duration, initial encounter: Secondary | ICD-10-CM | POA: Diagnosis not present

## 2021-06-04 DIAGNOSIS — I480 Paroxysmal atrial fibrillation: Secondary | ICD-10-CM | POA: Diagnosis not present

## 2021-06-05 DIAGNOSIS — S062X9S Diffuse traumatic brain injury with loss of consciousness of unspecified duration, sequela: Secondary | ICD-10-CM | POA: Diagnosis not present

## 2021-06-11 DIAGNOSIS — R2681 Unsteadiness on feet: Secondary | ICD-10-CM | POA: Diagnosis not present

## 2021-06-11 DIAGNOSIS — I10 Essential (primary) hypertension: Secondary | ICD-10-CM | POA: Diagnosis not present

## 2021-06-11 DIAGNOSIS — R946 Abnormal results of thyroid function studies: Secondary | ICD-10-CM | POA: Diagnosis not present

## 2021-06-11 DIAGNOSIS — E1140 Type 2 diabetes mellitus with diabetic neuropathy, unspecified: Secondary | ICD-10-CM | POA: Diagnosis not present

## 2021-06-11 DIAGNOSIS — G629 Polyneuropathy, unspecified: Secondary | ICD-10-CM | POA: Diagnosis not present

## 2021-06-11 DIAGNOSIS — R634 Abnormal weight loss: Secondary | ICD-10-CM | POA: Diagnosis not present

## 2021-06-15 DIAGNOSIS — G4733 Obstructive sleep apnea (adult) (pediatric): Secondary | ICD-10-CM | POA: Diagnosis not present

## 2021-06-21 ENCOUNTER — Ambulatory Visit: Payer: Medicare Other | Admitting: Emergency Medicine

## 2021-06-21 ENCOUNTER — Other Ambulatory Visit: Payer: Self-pay

## 2021-06-21 ENCOUNTER — Encounter: Payer: Self-pay | Admitting: Emergency Medicine

## 2021-06-21 ENCOUNTER — Telehealth: Payer: Self-pay | Admitting: Emergency Medicine

## 2021-06-21 VITALS — BP 118/68 | HR 90 | Temp 98.9°F | Ht 72.0 in | Wt 184.0 lb

## 2021-06-21 DIAGNOSIS — R918 Other nonspecific abnormal finding of lung field: Secondary | ICD-10-CM | POA: Diagnosis not present
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
## 2021-06-21 NOTE — Telephone Encounter (Signed)
Order for Super D/PET placed.  Libby precerted & I called Lovena Le in [facility] to get scheduled.  She has to check and see when they are doing them again and will call me back - she states may be tomorrow before getting back to me.  I told her pt is scheduled for bronch on 9/12 and needs to be done prior to that with disk to be sent to [facility].  She states this shouldn't be a problem.

## 2021-06-21 NOTE — Addendum Note (Signed)
Addended by: Gavin Potters R on: 06/21/2021 12:14 PM   Modules accepted: Orders

## 2021-06-21 NOTE — Telephone Encounter (Signed)
I scheduled Robotic Assisted ENB for 9/12 at 9:15 at [facility] END[facility].  Pt to go for covid test on 9/9.  Order for PET/CT has not been placed yet.  I gave pt appt info for ENB & covid test.  Told him to hold Xarelto and aspirin for 2 days prior to procedure and told him someone from [facility] would call him with  PET/CT appt info.

## 2021-06-21 NOTE — Patient Instructions (Signed)
We will perform a repeat CT chest and PET scan soon as possible to better characterize your pulmonary nodule. Based on suspicion we will work on arranging navigational bronchoscopy to biopsy your left lower lobe pulmonary nodule.  This would be done at [facility] endoscopy as an outpatient.  You would need a designated driver and someone to watch you after you get home from the test.  You would also need to stop your Eliquis and aspirin 2 days prior to the procedure. Follow with Dr Lamonte Sakai in 1 month

## 2021-06-22 DIAGNOSIS — U071 COVID-19: Secondary | ICD-10-CM | POA: Diagnosis not present

## 2021-06-25 ENCOUNTER — Other Ambulatory Visit: Payer: Self-pay | Admitting: Emergency Medicine

## 2021-06-25 ENCOUNTER — Telehealth: Payer: Self-pay | Admitting: Interventional Cardiology

## 2021-06-25 DIAGNOSIS — R918 Other nonspecific abnormal finding of lung field: Secondary | ICD-10-CM | POA: Diagnosis not present

## 2021-06-25 NOTE — Telephone Encounter (Signed)
Collyn is calling requesting Dr. Tamala Julian give him a call.

## 2021-06-25 NOTE — Telephone Encounter (Signed)
Left vm for Taylor in [facility] to check status of order.

## 2021-06-25 NOTE — Telephone Encounter (Signed)
Spoke with pt to see if I can help him and he said he would really like to speak with Dr. Tamala Julian directly due to some concerns he has.  Recently had CT in April and June and his PCP contacted him about 2 weeks ago due to some concerns on the CTs.  Pt states he mentioned a tumor in his lung and pt was sent to Dr. Lamonte Sakai.  Currently scheduled for a robotic video bronchoscopy with endobronchial navigation on 9/12.  Pt wanting to discuss with Dr. Tamala Julian his thoughts on his heart health in relation to any possible surgery or chemo/radiation tx that may come up in the near future.  Advised I will get message to Dr. Tamala Julian but am unsure of when he may be able to reach out.  Pt said no rush, he would just appreciate speaking with him.

## 2021-06-26 NOTE — Telephone Encounter (Signed)
I spoke to patient and feel it is okay to proceed with bronchoscopy and to hold anticoagulation to perform.

## 2021-06-26 NOTE — Telephone Encounter (Signed)
Pt has been scheduled for PET/Super D CT on 9/8 at 8:30.

## 2021-06-28 DIAGNOSIS — S065X9A Traumatic subdural hemorrhage with loss of consciousness of unspecified duration, initial encounter: Secondary | ICD-10-CM | POA: Diagnosis not present

## 2021-06-29 ENCOUNTER — Telehealth: Payer: Self-pay | Admitting: Emergency Medicine

## 2021-06-29 NOTE — Telephone Encounter (Signed)
Called and left vm for call back.

## 2021-07-03 NOTE — Telephone Encounter (Signed)
Left another vm for pt's wife to call me back.

## 2021-07-04 ENCOUNTER — Encounter: Payer: Medicare Other | Attending: Registered Nurse | Admitting: Physical Medicine & Rehabilitation

## 2021-07-04 ENCOUNTER — Encounter: Payer: Self-pay | Admitting: Physical Medicine & Rehabilitation

## 2021-07-04 ENCOUNTER — Other Ambulatory Visit: Payer: Self-pay

## 2021-07-04 VITALS — BP 115/58 | HR 78 | Temp 98.6°F | Ht 72.0 in | Wt 186.0 lb

## 2021-07-04 DIAGNOSIS — I480 Paroxysmal atrial fibrillation: Secondary | ICD-10-CM | POA: Diagnosis not present

## 2021-07-04 DIAGNOSIS — S065X3S Traumatic subdural hemorrhage with loss of consciousness of 1 hour to 5 hours 59 minutes, sequela: Secondary | ICD-10-CM | POA: Diagnosis not present
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
## 2021-07-04 NOTE — Patient Instructions (Addendum)
PLEASE FEEL FREE TO CALL OUR OFFICE WITH ANY PROBLEMS OR QUESTIONS ([phone])   CONDUCTIVE SOCKS FOR NEUROPATHIC PAIN WHICH YOU CONNECT TO A TENS UNIT

## 2021-07-04 NOTE — Telephone Encounter (Signed)
Called and spoke with patient's wife Mardene Celeste. She stated that the patient does not have any symptoms. He was exposed to someone who had COVID 2 weeks ago. He has been taking an at home covid test everyday. Yesterday when he took the test, their was a faint line in the positive section. He has not taken a test today.   I advised her to test him again today before 5pm and to call us with the results. Will keep this encounter open for follow up.

## 2021-07-04 NOTE — Telephone Encounter (Signed)
Pts wife called back and was given PET instructions.  Pts wife states pt had Covid 2 weeks ago and has been taking a home test everyday since then. States pt does not have symptoms but a faint line showed on Home test. Want to know if pt can still have PET scan and [facility] on 9/12

## 2021-07-05 ENCOUNTER — Encounter: Payer: Self-pay | Admitting: Emergency Medicine

## 2021-07-05 ENCOUNTER — Ambulatory Visit: Payer: Medicare Other | Admitting: Emergency Medicine

## 2021-07-05 ENCOUNTER — Encounter (HOSPITAL_COMMUNITY)
Admission: RE | Admit: 2021-07-05 | Discharge: 2021-07-05 | Disposition: A | Discharge status: Home / Self Care | Payer: Medicare Other | Source: Ambulatory Visit | Attending: Emergency Medicine | Admitting: Emergency Medicine

## 2021-07-05 ENCOUNTER — Encounter (HOSPITAL_COMMUNITY): Payer: Self-pay | Admitting: Emergency Medicine

## 2021-07-05 ENCOUNTER — Other Ambulatory Visit: Payer: Self-pay

## 2021-07-05 DIAGNOSIS — R918 Other nonspecific abnormal finding of lung field: Secondary | ICD-10-CM

## 2021-07-05 DIAGNOSIS — R911 Solitary pulmonary nodule: Secondary | ICD-10-CM | POA: Diagnosis not present

## 2021-07-05 DIAGNOSIS — I700 Atherosclerosis of aorta: Secondary | ICD-10-CM | POA: Diagnosis not present

## 2021-07-05 DIAGNOSIS — J984 Other disorders of lung: Secondary | ICD-10-CM | POA: Diagnosis not present

## 2021-07-05 DIAGNOSIS — I2510 Atherosclerotic heart disease of native coronary artery without angina pectoris: Secondary | ICD-10-CM | POA: Insufficient documentation

## 2021-07-05 DIAGNOSIS — I517 Cardiomegaly: Secondary | ICD-10-CM | POA: Insufficient documentation

## 2021-07-05 LAB — GLUCOSE, CAPILLARY: Glucose-Capillary: 136 mg/dL — ABNORMAL HIGH (ref 70–99)

## 2021-07-05 MED ORDER — FLUDEOXYGLUCOSE F - 18 (FDG) INJECTION
9.6000 | Freq: Once | INTRAVENOUS | Status: AC
  Administered 2021-07-05: 9.26 via INTRAVENOUS
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
## 2021-07-05 NOTE — Assessment & Plan Note (Signed)
Reviewed PET scan with him and his wife in detail today.  The left lower lobe nodule looks slightly larger, does have some hypermetabolism especially around the edges.  Remains concerning for malignancy.  His right lower lobe nodule has partial calcification and is unchanged in size and appearance, has no hypermetabolism.  Suspect benign.  I have recommended that we go forward with navigational bronchoscopy as planned for 9/12.  Reviewed with him that he needs to stop his Xarelto 2 days prior, go ahead and stop the Trental now if he is actually on it (he is unsure whether it is still on his medication list).  Depending on tissue type he will probably be a candidate for SBRT and/or chemotherapy.

## 2021-07-05 NOTE — Patient Instructions (Addendum)
We reviewed your CT scans and PET scan today. We are planning for navigational bronchoscopy on 07/09/2021. You will need to stop your Xarelto 2 days prior to the test (do not take Saturday, Sunday, Monday) Check your medications to see if you are on Trental (Pentoxifylline).  If so please go ahead and stop this medication now and we can talk about restarting it after the test. Follow with Dr Lamonte Sakai in 1 month
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
## 2021-07-06 NOTE — Anesthesia Preprocedure Evaluation (Addendum)
Anesthesia Evaluation  Patient identified by MRN, date of birth, ID band Patient awake    Reviewed: Allergy & Precautions, NPO status , Patient's Chart, lab work & pertinent test results  Airway Mallampati: II  TM Distance: >3 FB Neck ROM: Full    Dental  (+) Dental Advisory Given   Pulmonary sleep apnea and Continuous Positive Airway Pressure Ventilation , former smoker,    breath sounds clear to auscultation       Cardiovascular hypertension, Pt. on medications and Pt. on home beta blockers + CAD, + Past MI, + Cardiac Stents, + Peripheral Vascular Disease and +CHF   Rhythm:Regular Rate:Normal     Neuro/Psych  Neuromuscular disease    GI/Hepatic Neg liver ROS, GERD  ,  Endo/Other  diabetes, Type 2, Oral Hypoglycemic Agents  Renal/GU Renal disease     Musculoskeletal  (+) Arthritis ,   Abdominal   Peds  Hematology negative hematology ROS (+)   Anesthesia Other Findings   Reproductive/Obstetrics                            Anesthesia Physical Anesthesia Plan  ASA: 3  Anesthesia Plan: General   Post-op Pain Management:    Induction: Intravenous  PONV Risk Score and Plan: 2 and Dexamethasone, Ondansetron and Treatment may vary due to age or medical condition  Airway Management Planned: Oral ETT  Additional Equipment: None  Intra-op Plan:   Post-operative Plan: Extubation in OR  Informed Consent: I have reviewed the patients History and Physical, chart, labs and discussed the procedure including the risks, benefits and alternatives for the proposed anesthesia with the patient or authorized representative who has indicated his/her understanding and acceptance.     Dental advisory given  Plan Discussed with: CRNA  Anesthesia Plan Comments: (See APP note by Durel Salts, FNP)       Anesthesia Quick Evaluation

## 2021-07-09 ENCOUNTER — Other Ambulatory Visit: Payer: Self-pay

## 2021-07-09 ENCOUNTER — Encounter (HOSPITAL_COMMUNITY)
Admission: RE | Disposition: A | Discharge status: Home / Self Care | Payer: Self-pay | Source: Home / Self Care | Attending: Emergency Medicine

## 2021-07-09 ENCOUNTER — Ambulatory Visit (HOSPITAL_COMMUNITY): Payer: Medicare Other

## 2021-07-09 ENCOUNTER — Ambulatory Visit (HOSPITAL_COMMUNITY): Payer: Medicare Other | Admitting: Emergency Medicine

## 2021-07-09 ENCOUNTER — Encounter (HOSPITAL_COMMUNITY): Payer: Self-pay | Admitting: Emergency Medicine

## 2021-07-09 ENCOUNTER — Ambulatory Visit (HOSPITAL_COMMUNITY)
Admission: RE | Admit: 2021-07-09 | Discharge: 2021-07-09 | Disposition: A | Discharge status: Home / Self Care | Payer: Medicare Other | Attending: Emergency Medicine | Admitting: Emergency Medicine

## 2021-07-09 DIAGNOSIS — G4733 Obstructive sleep apnea (adult) (pediatric): Secondary | ICD-10-CM | POA: Diagnosis not present

## 2021-07-09 DIAGNOSIS — R918 Other nonspecific abnormal finding of lung field: Secondary | ICD-10-CM | POA: Diagnosis not present

## 2021-07-09 DIAGNOSIS — K219 Gastro-esophageal reflux disease without esophagitis: Secondary | ICD-10-CM | POA: Insufficient documentation

## 2021-07-09 DIAGNOSIS — E119 Type 2 diabetes mellitus without complications: Secondary | ICD-10-CM | POA: Insufficient documentation

## 2021-07-09 DIAGNOSIS — Z87891 Personal history of nicotine dependence: Secondary | ICD-10-CM | POA: Insufficient documentation

## 2021-07-09 DIAGNOSIS — I25119 Atherosclerotic heart disease of native coronary artery with unspecified angina pectoris: Secondary | ICD-10-CM | POA: Diagnosis not present

## 2021-07-09 DIAGNOSIS — I480 Paroxysmal atrial fibrillation: Secondary | ICD-10-CM | POA: Insufficient documentation

## 2021-07-09 DIAGNOSIS — Z79899 Other long term (current) drug therapy: Secondary | ICD-10-CM | POA: Diagnosis not present

## 2021-07-09 DIAGNOSIS — I255 Ischemic cardiomyopathy: Secondary | ICD-10-CM | POA: Diagnosis not present

## 2021-07-09 DIAGNOSIS — I5042 Chronic combined systolic (congestive) and diastolic (congestive) heart failure: Secondary | ICD-10-CM | POA: Insufficient documentation

## 2021-07-09 DIAGNOSIS — Z955 Presence of coronary angioplasty implant and graft: Secondary | ICD-10-CM | POA: Insufficient documentation

## 2021-07-09 DIAGNOSIS — I2510 Atherosclerotic heart disease of native coronary artery without angina pectoris: Secondary | ICD-10-CM | POA: Insufficient documentation

## 2021-07-09 DIAGNOSIS — Z7984 Long term (current) use of oral hypoglycemic drugs: Secondary | ICD-10-CM | POA: Diagnosis not present

## 2021-07-09 DIAGNOSIS — I110 Hypertensive heart disease with heart failure: Secondary | ICD-10-CM | POA: Insufficient documentation

## 2021-07-09 DIAGNOSIS — Z8616 Personal history of COVID-19: Secondary | ICD-10-CM | POA: Diagnosis not present

## 2021-07-09 DIAGNOSIS — Z888 Allergy status to other drugs, medicaments and biological substances status: Secondary | ICD-10-CM | POA: Diagnosis not present

## 2021-07-09 DIAGNOSIS — Z419 Encounter for procedure for purposes other than remedying health state, unspecified: Secondary | ICD-10-CM

## 2021-07-09 DIAGNOSIS — Z9889 Other specified postprocedural states: Secondary | ICD-10-CM

## 2021-07-09 DIAGNOSIS — C3432 Malignant neoplasm of lower lobe, left bronchus or lung: Secondary | ICD-10-CM | POA: Diagnosis not present

## 2021-07-09 HISTORY — PX: VIDEO BRONCHOSCOPY WITH RADIAL ENDOBRONCHIAL ULTRASOUND: SHX6849

## 2021-07-09 HISTORY — DX: Malignant (primary) neoplasm, unspecified: C80.1

## 2021-07-09 HISTORY — DX: Myoneural disorder, unspecified: G70.9

## 2021-07-09 HISTORY — PX: VIDEO BRONCHOSCOPY WITH ENDOBRONCHIAL NAVIGATION: SHX6175

## 2021-07-09 HISTORY — DX: COVID-19: U07.1

## 2021-07-09 HISTORY — PX: BRONCHIAL BIOPSY: SHX5109

## 2021-07-09 HISTORY — DX: Depression, unspecified: F32.A

## 2021-07-09 HISTORY — PX: FIDUCIAL MARKER PLACEMENT: SHX6858

## 2021-07-09 HISTORY — DX: Pneumonia, unspecified organism: J18.9

## 2021-07-09 HISTORY — PX: BRONCHIAL BRUSHINGS: SHX5108

## 2021-07-09 HISTORY — DX: Encephalitis and encephalomyelitis, unspecified: G04.90

## 2021-07-09 HISTORY — PX: BRONCHIAL NEEDLE ASPIRATION BIOPSY: SHX5106

## 2021-07-09 LAB — GLUCOSE, CAPILLARY
Glucose-Capillary: 126 mg/dL — ABNORMAL HIGH (ref 70–99)
Glucose-Capillary: 117 mg/dL — ABNORMAL HIGH (ref 70–99)

## 2021-07-09 LAB — BASIC METABOLIC PANEL
Anion gap: 9 (ref 5–15)
BUN: 23 mg/dL (ref 8–23)
CO2: 24 mmol/L (ref 22–32)
Calcium: 9.3 mg/dL (ref 8.9–10.3)
Chloride: 105 mmol/L (ref 98–111)
Creatinine, Ser: 0.96 mg/dL (ref 0.61–1.24)
GFR, Estimated: >60 mL/min (ref >60)
Glucose, Bld: 126 mg/dL — ABNORMAL HIGH (ref 70–99)
Potassium: 5 mmol/L (ref 3.5–5.1)
Sodium: 138 mmol/L (ref 135–145)

## 2021-07-09 SURGERY — VIDEO BRONCHOSCOPY WITH ENDOBRONCHIAL NAVIGATION
Anesthesia: General

## 2021-07-09 MED ORDER — ONDANSETRON HCL 4 MG/2ML IJ SOLN
INTRAMUSCULAR | Status: DC | PRN
  Administered 2021-07-09: 4 mg via INTRAVENOUS

## 2021-07-09 MED ORDER — RIVAROXABAN 20 MG PO TABS: 20.0000 mg | ORAL_TABLET | Freq: Every evening | ORAL | 0 refills | Status: DC

## 2021-07-09 MED ORDER — FENTANYL CITRATE (PF) 250 MCG/5ML IJ SOLN
INTRAMUSCULAR | Status: AC
  Filled 2021-07-09: qty 5

## 2021-07-09 MED ORDER — GLYCOPYRROLATE 0.2 MG/ML IJ SOLN
INTRAMUSCULAR | Status: DC | PRN
  Administered 2021-07-09: .2 mg via INTRAVENOUS

## 2021-07-09 MED ORDER — DEXAMETHASONE SODIUM PHOSPHATE 10 MG/ML IJ SOLN
INTRAMUSCULAR | Status: DC | PRN
  Administered 2021-07-09: 10 mg via INTRAVENOUS

## 2021-07-09 MED ORDER — PENTOXIFYLLINE ER 400 MG PO TBCR: 400.0000 mg | EXTENDED_RELEASE_TABLET | Freq: Three times a day (TID) | ORAL | 3 refills | Status: DC

## 2021-07-09 MED ORDER — AMIODARONE HCL 200 MG PO TABS: 200.0000 mg | ORAL_TABLET | Freq: Every day | ORAL | Status: DC

## 2021-07-09 MED ORDER — ROCURONIUM BROMIDE 10 MG/ML (PF) SYRINGE
PREFILLED_SYRINGE | INTRAVENOUS | Status: DC | PRN
  Administered 2021-07-09: 40 mg via INTRAVENOUS

## 2021-07-09 MED ORDER — SENNOSIDES-DOCUSATE SODIUM 8.6-50 MG PO TABS: 1.0000 | ORAL_TABLET | Freq: Every day | ORAL | Status: AC | PRN

## 2021-07-09 MED ORDER — PROPOFOL 10 MG/ML IV BOLUS
INTRAVENOUS | Status: DC | PRN
  Administered 2021-07-09: 120 mg via INTRAVENOUS

## 2021-07-09 MED ORDER — PHENYLEPHRINE 40 MCG/ML (10ML) SYRINGE FOR IV PUSH (FOR BLOOD PRESSURE SUPPORT)
PREFILLED_SYRINGE | INTRAVENOUS | Status: DC | PRN
  Administered 2021-07-09 (×4): 80 ug via INTRAVENOUS
  Administered 2021-07-09: 40 ug via INTRAVENOUS

## 2021-07-09 MED ORDER — LIDOCAINE 2% (20 MG/ML) 5 ML SYRINGE
INTRAMUSCULAR | Status: DC | PRN
  Administered 2021-07-09: 50 mg via INTRAVENOUS

## 2021-07-09 MED ORDER — FUROSEMIDE 40 MG PO TABS: 40.0000 mg | ORAL_TABLET | Freq: Every day | ORAL | Status: AC | PRN

## 2021-07-09 MED ORDER — LACTATED RINGERS IV SOLN: INTRAVENOUS | Status: DC

## 2021-07-09 MED ORDER — SUCCINYLCHOLINE CHLORIDE 200 MG/10ML IV SOSY
PREFILLED_SYRINGE | INTRAVENOUS | Status: DC | PRN
Start: 2021-07-09 — End: 2021-07-09
  Administered 2021-07-09: 100 mg via INTRAVENOUS

## 2021-07-09 MED ORDER — SUGAMMADEX SODIUM 200 MG/2ML IV SOLN
INTRAVENOUS | Status: DC | PRN
  Administered 2021-07-09: 200 mg via INTRAVENOUS

## 2021-07-09 MED ORDER — FENTANYL CITRATE (PF) 250 MCG/5ML IJ SOLN
INTRAMUSCULAR | Status: DC | PRN
  Administered 2021-07-09: 50 ug via INTRAVENOUS

## 2021-07-09 MED ORDER — CHLORHEXIDINE GLUCONATE 0.12 % MT SOLN
OROMUCOSAL | Status: AC
  Administered 2021-07-09: 15 mL
  Filled 2021-07-09: qty 15

## 2021-07-09 SURGICAL SUPPLY — 1 items: fiducial marker ×3 IMPLANT

## 2021-07-09 NOTE — Interval H&P Note (Signed)
History and Physical Interval Note:  07/09/2021 10:35 AM  [name]  has presented today for surgery, with the diagnosis of PULMONARY NODULE.  The various methods of treatment have been discussed with the patient and family. After consideration of risks, benefits and other options for treatment, the patient has consented to  Procedure(s): ROBOTIC [place] (N/A) as a surgical intervention.  The patient's history has been reviewed, patient examined, no change in status, stable for surgery.  I have reviewed the patient's chart and labs.  Questions were answered to the patient's satisfaction.     Collene Gobble
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
## 2021-07-09 NOTE — Discharge Instructions (Signed)
Flexible Bronchoscopy, Care After This sheet gives you information about how to care for yourself after your test. Your doctor may also give you more specific instructions. If you have problems or questions, contact your doctor. Follow these instructions at home: Eating and drinking Do not eat or drink anything (not even water) for 2 hours after your test, or until your numbing medicine (local anesthetic) wears off. When your numbness is gone and your cough and gag reflexes have come back, you may: Eat only soft foods. Slowly drink liquids. The day after the test, go back to your normal diet. Driving Do not drive for 24 hours if you were given a medicine to help you relax (sedative). Do not drive or use heavy machinery while taking prescription pain medicine. General instructions  Take over-the-counter and prescription medicines only as told by your doctor. Return to your normal activities as told. Ask what activities are safe for you. Do not use any products that have nicotine or tobacco in them. This includes cigarettes and e-cigarettes. If you need help quitting, ask your doctor. Keep all follow-up visits as told by your doctor. This is important. It is very important if you had a tissue sample (biopsy) taken. Get help right away if: You have shortness of breath that gets worse. You get light-headed. You feel like you are going to pass out (faint). You have chest pain. You cough up: More than a little blood. More blood than before. Summary Do not eat or drink anything (not even water) for 2 hours after your test, or until your numbing medicine wears off. Do not use cigarettes. Do not use e-cigarettes. Get help right away if you have chest pain.  Please call our office for any questions or concerns.  [phone].   This information is not intended to replace advice given to you by your health care provider. Make sure you discuss any questions you have with your health care  provider. Document Released: 08/11/2009 Document Revised: 09/26/2017 Document Reviewed: 11/01/2016 Elsevier Patient Education  2020 [employer].

## 2021-07-09 NOTE — Transfer of Care (Signed)
Immediate Anesthesia Transfer of Care Note  Patient: [name]  Procedure(s) Performed: ROBOTIC VIDEO BRONCHOSCOPY WITH ENDOBRONCHIAL NAVIGATION VIDEO BRONCHOSCOPY WITH RADIAL ENDOBRONCHIAL ULTRASOUND BRONCHIAL NEEDLE ASPIRATION BIOPSIES BRONCHIAL BRUSHINGS BRONCHIAL BIOPSIES FIDUCIAL MARKER PLACEMENT  Patient Location: PACU  Anesthesia Type:General  Level of Consciousness: patient cooperative and responds to stimulation  Airway & Oxygen Therapy: Patient Spontanous Breathing and Patient connected to face mask oxygen  Post-op Assessment: Report given to RN, Post -op Vital signs reviewed and stable and Patient moving all extremities X 4  Post vital signs: Reviewed and stable  Last Vitals:  Vitals Value Taken Time  BP 105/54 07/09/21 1140  Temp 36.2 C 07/09/21 1140  Pulse 86 07/09/21 1142  Resp 19 07/09/21 1142  SpO2 98 % 07/09/21 1142  Vitals shown include unvalidated device data.  Last Pain:  Vitals:   07/09/21 1140  TempSrc:   PainSc: 0-No pain      Patients Stated Pain Goal: 1 (57/[id number])  Complications: No notable events documented.

## 2021-07-09 NOTE — Anesthesia Procedure Notes (Signed)
Procedure Name: Intubation Date/Time: 07/09/2021 10:31 AM Performed by: Betha Loa, CRNA Pre-anesthesia Checklist: Patient identified, Emergency Drugs available, Suction available and Patient being monitored Patient Re-evaluated:Patient Re-evaluated prior to induction Oxygen Delivery Method: Circle System Utilized Preoxygenation: Pre-oxygenation with 100% oxygen Induction Type: IV induction Laryngoscope Size: Mac and 4 Grade View: Grade I Tube type: Oral Tube size: 8.5 mm Number of attempts: 1 Airway Equipment and Method: Stylet and Oral airway Placement Confirmation: ETT inserted through vocal cords under direct vision, positive ETCO2 and breath sounds checked- equal and bilateral Secured at: 21 cm Tube secured with: Tape Dental Injury: Teeth and Oropharynx as per pre-operative assessment

## 2021-07-10 ENCOUNTER — Encounter (HOSPITAL_COMMUNITY): Payer: Self-pay | Admitting: Emergency Medicine

## 2021-07-10 NOTE — Anesthesia Postprocedure Evaluation (Signed)
Anesthesia Post Note  Patient: [name]  Procedure(s) Performed: ROBOTIC VIDEO BRONCHOSCOPY WITH ENDOBRONCHIAL NAVIGATION VIDEO BRONCHOSCOPY WITH RADIAL ENDOBRONCHIAL ULTRASOUND BRONCHIAL NEEDLE ASPIRATION BIOPSIES BRONCHIAL BRUSHINGS BRONCHIAL BIOPSIES FIDUCIAL MARKER PLACEMENT     Patient location during evaluation: PACU Anesthesia Type: General Level of consciousness: awake and alert Pain management: pain level controlled Vital Signs Assessment: post-procedure vital signs reviewed and stable Respiratory status: spontaneous breathing, nonlabored ventilation, respiratory function stable and patient connected to nasal cannula oxygen Cardiovascular status: blood pressure returned to baseline and stable Postop Assessment: no apparent nausea or vomiting Anesthetic complications: no   No notable events documented.  Last Vitals:  Vitals:   07/09/21 1140 07/09/21 1155  BP: (!) 105/54 (!) 103/52  Pulse: 73 81  Resp: 16 17  Temp: (!) 36.2 C (!) 36.1 C  SpO2: 100% 95%    Last Pain:  Vitals:   07/09/21 1155  TempSrc:   PainSc: 0-No pain                 Tiajuana Amass

## 2021-07-11 ENCOUNTER — Ambulatory Visit: Payer: Medicare Other | Attending: Physical Medicine & Rehabilitation | Admitting: Physical Therapy

## 2021-07-11 ENCOUNTER — Other Ambulatory Visit: Payer: Self-pay

## 2021-07-11 DIAGNOSIS — R2689 Other abnormalities of gait and mobility: Secondary | ICD-10-CM | POA: Insufficient documentation

## 2021-07-11 DIAGNOSIS — M6281 Muscle weakness (generalized): Secondary | ICD-10-CM | POA: Insufficient documentation

## 2021-07-11 DIAGNOSIS — R2681 Unsteadiness on feet: Secondary | ICD-10-CM | POA: Insufficient documentation
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
## 2021-07-13 ENCOUNTER — Telehealth: Payer: Self-pay | Admitting: Emergency Medicine

## 2021-07-13 ENCOUNTER — Emergency Department (HOSPITAL_COMMUNITY): Payer: Medicare Other

## 2021-07-13 ENCOUNTER — Encounter (HOSPITAL_COMMUNITY): Payer: Self-pay | Admitting: Emergency Medicine

## 2021-07-13 ENCOUNTER — Emergency Department (HOSPITAL_COMMUNITY)
Admission: EM | Admit: 2021-07-13 | Discharge: 2021-07-13 | Disposition: A | Discharge status: Home / Self Care | Payer: Medicare Other | Attending: Emergency Medicine | Admitting: Emergency Medicine

## 2021-07-13 ENCOUNTER — Other Ambulatory Visit: Payer: Self-pay

## 2021-07-13 DIAGNOSIS — Z8616 Personal history of COVID-19: Secondary | ICD-10-CM | POA: Diagnosis not present

## 2021-07-13 DIAGNOSIS — Z85828 Personal history of other malignant neoplasm of skin: Secondary | ICD-10-CM | POA: Insufficient documentation

## 2021-07-13 DIAGNOSIS — Z951 Presence of aortocoronary bypass graft: Secondary | ICD-10-CM | POA: Diagnosis not present

## 2021-07-13 DIAGNOSIS — R1110 Vomiting, unspecified: Secondary | ICD-10-CM | POA: Diagnosis not present

## 2021-07-13 DIAGNOSIS — C3490 Malignant neoplasm of unspecified part of unspecified bronchus or lung: Secondary | ICD-10-CM | POA: Diagnosis not present

## 2021-07-13 DIAGNOSIS — R110 Nausea: Secondary | ICD-10-CM | POA: Diagnosis not present

## 2021-07-13 DIAGNOSIS — I10 Essential (primary) hypertension: Secondary | ICD-10-CM | POA: Diagnosis not present

## 2021-07-13 DIAGNOSIS — I110 Hypertensive heart disease with heart failure: Secondary | ICD-10-CM | POA: Diagnosis not present

## 2021-07-13 DIAGNOSIS — I25119 Atherosclerotic heart disease of native coronary artery with unspecified angina pectoris: Secondary | ICD-10-CM | POA: Insufficient documentation

## 2021-07-13 DIAGNOSIS — R911 Solitary pulmonary nodule: Secondary | ICD-10-CM | POA: Diagnosis not present

## 2021-07-13 DIAGNOSIS — Z7901 Long term (current) use of anticoagulants: Secondary | ICD-10-CM | POA: Insufficient documentation

## 2021-07-13 DIAGNOSIS — Z7984 Long term (current) use of oral hypoglycemic drugs: Secondary | ICD-10-CM | POA: Insufficient documentation

## 2021-07-13 DIAGNOSIS — E1140 Type 2 diabetes mellitus with diabetic neuropathy, unspecified: Secondary | ICD-10-CM | POA: Insufficient documentation

## 2021-07-13 DIAGNOSIS — Z79899 Other long term (current) drug therapy: Secondary | ICD-10-CM | POA: Insufficient documentation

## 2021-07-13 DIAGNOSIS — R319 Hematuria, unspecified: Secondary | ICD-10-CM | POA: Insufficient documentation

## 2021-07-13 DIAGNOSIS — R2681 Unsteadiness on feet: Secondary | ICD-10-CM | POA: Diagnosis not present

## 2021-07-13 DIAGNOSIS — K449 Diaphragmatic hernia without obstruction or gangrene: Secondary | ICD-10-CM | POA: Diagnosis not present

## 2021-07-13 DIAGNOSIS — I4891 Unspecified atrial fibrillation: Secondary | ICD-10-CM | POA: Diagnosis not present

## 2021-07-13 DIAGNOSIS — R112 Nausea with vomiting, unspecified: Secondary | ICD-10-CM | POA: Insufficient documentation

## 2021-07-13 DIAGNOSIS — N200 Calculus of kidney: Secondary | ICD-10-CM | POA: Diagnosis not present

## 2021-07-13 DIAGNOSIS — R079 Chest pain, unspecified: Secondary | ICD-10-CM | POA: Insufficient documentation

## 2021-07-13 DIAGNOSIS — Z87891 Personal history of nicotine dependence: Secondary | ICD-10-CM | POA: Diagnosis not present

## 2021-07-13 DIAGNOSIS — I5042 Chronic combined systolic (congestive) and diastolic (congestive) heart failure: Secondary | ICD-10-CM | POA: Diagnosis not present

## 2021-07-13 DIAGNOSIS — G629 Polyneuropathy, unspecified: Secondary | ICD-10-CM

## 2021-07-13 DIAGNOSIS — I723 Aneurysm of iliac artery: Secondary | ICD-10-CM | POA: Diagnosis not present

## 2021-07-13 LAB — CBC WITH DIFFERENTIAL/PLATELET
Abs Immature Granulocytes: 0.17 10*3/uL — ABNORMAL HIGH (ref 0.00–0.07)
Basophils Absolute: 0 10*3/uL (ref 0.0–0.1)
Basophils Relative: 0 %
Eosinophils Absolute: 0 10*3/uL (ref 0.0–0.5)
Eosinophils Relative: 0 %
HCT: 37.6 % — ABNORMAL LOW (ref 39.0–52.0)
Hemoglobin: 12.7 g/dL — ABNORMAL LOW (ref 13.0–17.0)
Immature Granulocytes: 3 %
Lymphocytes Relative: 12 %
Lymphs Abs: 0.6 10*3/uL — ABNORMAL LOW (ref 0.7–4.0)
MCH: 31.5 pg (ref 26.0–34.0)
MCHC: 33.8 g/dL (ref 30.0–36.0)
MCV: 93.3 fL (ref 80.0–100.0)
Monocytes Absolute: 0.5 10*3/uL (ref 0.1–1.0)
Monocytes Relative: 10 %
Neutro Abs: 3.9 10*3/uL (ref 1.7–7.7)
Neutrophils Relative %: 75 %
Platelets: 179 10*3/uL (ref 150–400)
RBC: 4.03 MIL/uL — ABNORMAL LOW (ref 4.22–5.81)
RDW: 13.8 % (ref 11.5–15.5)
WBC: 5.2 10*3/uL (ref 4.0–10.5)
nRBC: 0 % (ref 0.0–0.2)

## 2021-07-13 LAB — URINALYSIS, ROUTINE W REFLEX MICROSCOPIC
Bilirubin Urine: NEGATIVE
Glucose, UA: >=500 mg/dL — AB
Ketones, ur: NEGATIVE mg/dL
Leukocytes,Ua: NEGATIVE
Nitrite: NEGATIVE
Protein, ur: NEGATIVE mg/dL
Specific Gravity, Urine: 1.01 (ref 1.005–1.030)
pH: 7 (ref 5.0–8.0)

## 2021-07-13 LAB — COMPREHENSIVE METABOLIC PANEL
ALT: 23 U/L (ref 0–44)
AST: 15 U/L (ref 15–41)
Albumin: 4.2 g/dL (ref 3.5–5.0)
Alkaline Phosphatase: 85 U/L (ref 38–126)
Anion gap: 10 (ref 5–15)
BUN: 24 mg/dL — ABNORMAL HIGH (ref 8–23)
CO2: 28 mmol/L (ref 22–32)
Calcium: 9.9 mg/dL (ref 8.9–10.3)
Chloride: 100 mmol/L (ref 98–111)
Creatinine, Ser: 0.96 mg/dL (ref 0.61–1.24)
GFR, Estimated: >60 mL/min (ref >60)
Glucose, Bld: 160 mg/dL — ABNORMAL HIGH (ref 70–99)
Potassium: 3.8 mmol/L (ref 3.5–5.1)
Sodium: 138 mmol/L (ref 135–145)
Total Bilirubin: 1.2 mg/dL (ref 0.3–1.2)
Total Protein: 8.3 g/dL — ABNORMAL HIGH (ref 6.5–8.1)

## 2021-07-13 LAB — URINALYSIS, MICROSCOPIC (REFLEX)
Bacteria, UA: NONE SEEN
Squamous Epithelial / HPF: NONE SEEN (ref 0–5)

## 2021-07-13 LAB — LIPASE, BLOOD: Lipase: 23 U/L (ref 11–51)

## 2021-07-13 LAB — CYTOLOGY - NON PAP

## 2021-07-13 LAB — TROPONIN I (HIGH SENSITIVITY): Troponin I (High Sensitivity): 4 ng/L (ref <18)

## 2021-07-13 MED ORDER — SODIUM CHLORIDE 0.9 % IV SOLN: INTRAVENOUS | Status: DC

## 2021-07-13 MED ORDER — SODIUM CHLORIDE 0.9 % IV BOLUS
500.0000 mL | Freq: Once | INTRAVENOUS | Status: AC
  Administered 2021-07-13: 500 mL via INTRAVENOUS

## 2021-07-13 MED ORDER — HYDROMORPHONE HCL 1 MG/ML IJ SOLN
0.5000 mg | Freq: Once | INTRAMUSCULAR | Status: AC
  Administered 2021-07-13: 0.5 mg via INTRAVENOUS
  Filled 2021-07-13: qty 1

## 2021-07-13 MED ORDER — ONDANSETRON 4 MG PO TBDP
4.0000 mg | ORAL_TABLET | Freq: Three times a day (TID) | ORAL | 0 refills | Status: AC | PRN
Start: 2021-07-13 — End: ?

## 2021-07-13 MED ORDER — ONDANSETRON HCL 4 MG/2ML IJ SOLN
4.0000 mg | Freq: Once | INTRAMUSCULAR | Status: AC
  Administered 2021-07-13: 4 mg via INTRAVENOUS
  Filled 2021-07-13: qty 2

## 2021-07-13 MED ORDER — ONDANSETRON HCL 4 MG PO TABS: 4.0000 mg | ORAL_TABLET | Freq: Four times a day (QID) | ORAL | 0 refills | Status: DC

## 2021-07-13 NOTE — Telephone Encounter (Signed)
I reviewed results with pathology this morning.  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  Called to discuss w patient - no answer, left VM, will have to call him again   Discussed the diagnosis with the patient by phone.  He is experiencing some nausea, vomiting, poor p.o. intake and probably dehydration.  Question whether this may be paraneoplastic and associated with some hypercalcemia given the new diagnosis of squamous cell.  He is going to be sent to the emergency department by his PCP for further evaluation, possible admission. I will make a referral to [facility] so we can continue evaluation and plan treatment of his lung cancer.  He also needs an MRI brain and I will order this
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
## 2021-07-13 NOTE — ED Provider Notes (Signed)
Patient signed out to me by previous provider. Please refer to their note for full HPI.  Briefly this is an 80 year old male who presented the emergency department with concern for nausea.  His metabolic work-up is reassuring, urinalysis had hematuria so plan for CT study to rule out kidney stone and other acute pathology. Physical Exam  BP 127/83   Pulse (!) 107   Temp 97.6 F (36.4 C) (Oral)   Resp 18   Ht 6' (1.829 m)   Wt 82.6 kg   SpO2 100%   BMI 24.68 kg/m   Physical Exam Vitals and nursing note reviewed.  Constitutional:      General: He is not in acute distress.    Appearance: Normal appearance.  HENT:     Head: Normocephalic.     Mouth/Throat:     Mouth: Mucous membranes are moist.  Cardiovascular:     Rate and Rhythm: Normal rate.  Pulmonary:     Effort: Pulmonary effort is normal. No respiratory distress.  Abdominal:     Palpations: Abdomen is soft.     Tenderness: There is no abdominal tenderness. There is no guarding.  Skin:    General: Skin is warm.  Neurological:     Mental Status: He is alert and oriented to person, place, and time. Mental status is at baseline.  Psychiatric:        Mood and Affect: Mood normal.    ED Course/Procedures   Clinical Course as of 07/13/21 1932  Fri Jul 13, 2021  1419 Comprehensive metabolic panel without acute findings.  Lipase is normal.  Urinalysis does show 21-50 RBCs [JK]  1419 CBC is essentially normal except mild decrease in hemoglobin [JK]  1420 CT scan showed no lung mass [JK]    Clinical Course User Index [JK] Dorie Rank, MD    Procedures  MDM   CT renal study shows chronic nonacute findings.  There is a small hiatal hernia which the patient was unaware of, this may be contributing to his symptoms/nausea.  It showed a stable AAA which the patient and family were aware of as well as incidental pulmonary nodules.  All of this information has been relayed to the patient and family.  Patient feels much better  after antinausea medicine, he already has oxycodone at home for pain control.  Plan for outpatient nausea treatment with Zofran.  Patient at this time appears safe and stable for discharge and will be treated as an outpatient.  Discharge plan and strict return to ED precautions discussed, patient verbalizes understanding and agreement.       Lorelle Gibbs, DO 07/13/21 1934

## 2021-07-13 NOTE — Discharge Instructions (Addendum)
You have been seen and discharged from the emergency department.  Your CAT scan showed a small hiatal hernia which may be contributing to your symptoms.  It also noted a stable abdominal aortic aneurysm and an incidental pulmonary nodule that should be monitored as an outpatient by her primary doctor.  Follow-up with your primary provider for reevaluation and further care.  You have been prescribed nausea medicine, take as directed.  Take home medications as prescribed. If you have any worsening symptoms or further concerns for your health please return to an emergency department for further evaluation.

## 2021-07-13 NOTE — ED Triage Notes (Signed)
Patient complains of nausea and one episode of emesis today, got results from 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 Suspect dehydration.

## 2021-07-13 NOTE — Telephone Encounter (Signed)
Still waiting for path results from [facility]. Left a message for patient to explain that results are pending and that I will call  back when I have that information.

## 2021-07-16 ENCOUNTER — Telehealth: Payer: Self-pay | Admitting: *Deleted

## 2021-07-16 ENCOUNTER — Telehealth: Payer: Self-pay | Admitting: Adult Health

## 2021-07-16 DIAGNOSIS — L723 Sebaceous cyst: Secondary | ICD-10-CM | POA: Diagnosis not present

## 2021-07-16 DIAGNOSIS — L905 Scar conditions and fibrosis of skin: Secondary | ICD-10-CM | POA: Diagnosis not present

## 2021-07-16 DIAGNOSIS — L820 Inflamed seborrheic keratosis: Secondary | ICD-10-CM | POA: Diagnosis not present

## 2021-07-16 DIAGNOSIS — R918 Other nonspecific abnormal finding of lung field: Secondary | ICD-10-CM

## 2021-07-16 NOTE — Telephone Encounter (Signed)
Dr. Lamonte Sakai, please advise on mychart message below, thanks!  Dr. Lamonte Sakai,   Thank you for your interest in following my case. I am relying on your considerable skills to help me get through my delemma. You have been very kind.   Last Friday when when we spoke you mentioned another test you wanted to do on me to insure no cancer had invaded my brain. I am ready to do that when you are.   Yours very truly,   [name]  [phone]

## 2021-07-16 NOTE — Telephone Encounter (Signed)
I received a referral on [name]. I called and scheduled him to be seen with Dr. Julien Nordmann. Verbalized understanding of appt time and place.

## 2021-07-16 NOTE — Telephone Encounter (Signed)
Please let him know that I ordered an MRI of the brain - probably is being worked on, if not already scheduled.

## 2021-07-16 NOTE — Telephone Encounter (Signed)
error 

## 2021-07-16 NOTE — Telephone Encounter (Signed)
I received a vm message from [name]. I called her and she wanted an update on appt with Dr. Julien Nordmann. I explained it was a consultation.  She verbalized understanding.

## 2021-07-18 ENCOUNTER — Telehealth: Payer: Self-pay | Admitting: Interventional Cardiology

## 2021-07-18 DIAGNOSIS — R112 Nausea with vomiting, unspecified: Secondary | ICD-10-CM | POA: Diagnosis not present

## 2021-07-18 MED ORDER — AMIODARONE HCL 200 MG PO TABS: 200.0000 mg | ORAL_TABLET | Freq: Every day | ORAL | 3 refills | Status: DC

## 2021-07-18 NOTE — Telephone Encounter (Signed)
Patient's wife states this medication was prescribed while the patient was admitted and she would like to know if he needs to continue taking it. If so, she would like to have a refill sent to the pharmacy listed below.  STAT* If patient is at the pharmacy, call can be transferred to refill team.   1. Which medications need to be refilled? (please list name of each medication and dose if known)  amiodarone (PACERONE) 200 MG tablet  2. Which pharmacy/location (including street and city if local pharmacy) is medication to be sent to? [place], [place]   3. Do they need a 30 day or 90 day supply?  90 day supply

## 2021-07-18 NOTE — Telephone Encounter (Signed)
Pt's medication was sent to pt's pharmacy as requested. Confirmation received.  °

## 2021-07-19 ENCOUNTER — Other Ambulatory Visit: Payer: Self-pay | Admitting: *Deleted

## 2021-07-19 DIAGNOSIS — M79671 Pain in right foot: Secondary | ICD-10-CM | POA: Diagnosis not present

## 2021-07-19 DIAGNOSIS — M79672 Pain in left foot: Secondary | ICD-10-CM | POA: Diagnosis not present

## 2021-07-19 DIAGNOSIS — M792 Neuralgia and neuritis, unspecified: Secondary | ICD-10-CM | POA: Diagnosis not present

## 2021-07-19 NOTE — Progress Notes (Signed)
The proposed treatment discussed in conference is for discussion purpose only and is not a binding recommendation. The patient was not been physically examined, or presented with their treatment options. Therefore, final treatment plans cannot be decided.  

## 2021-07-20 ENCOUNTER — Ambulatory Visit: Payer: Medicare Other | Admitting: Physical Therapy

## 2021-07-20 ENCOUNTER — Encounter: Payer: Self-pay | Admitting: Physical Therapy

## 2021-07-20 ENCOUNTER — Other Ambulatory Visit: Payer: Self-pay

## 2021-07-20 DIAGNOSIS — M6281 Muscle weakness (generalized): Secondary | ICD-10-CM

## 2021-07-20 DIAGNOSIS — R2681 Unsteadiness on feet: Secondary | ICD-10-CM | POA: Diagnosis not present

## 2021-07-20 DIAGNOSIS — R2689 Other abnormalities of gait and mobility: Secondary | ICD-10-CM | POA: Diagnosis not present

## 2021-07-20 NOTE — Patient Instructions (Signed)
Access Code: [email or web address] URL: [email or web address] Date: 07/20/2021 Prepared by: Mady Haagensen  Exercises  Seated Gluteal Sets - 1 x daily - 5 x weekly - 2 sets - 10 reps - 3 sec hold Seated Hip Adduction Isometrics with Ball - 1 x daily - 7 x weekly - 2 sets - 10 reps - 3 sec hold
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
## 2021-07-23 ENCOUNTER — Inpatient Hospital Stay: Payer: Medicare Other

## 2021-07-23 ENCOUNTER — Other Ambulatory Visit: Payer: Self-pay

## 2021-07-23 ENCOUNTER — Inpatient Hospital Stay: Payer: Medicare Other | Attending: Internal Medicine | Admitting: Internal Medicine

## 2021-07-23 VITALS — BP 117/67 | HR 101 | Temp 97.5°F | Resp 18 | Wt 184.2 lb

## 2021-07-23 DIAGNOSIS — I25119 Atherosclerotic heart disease of native coronary artery with unspecified angina pectoris: Secondary | ICD-10-CM | POA: Diagnosis not present

## 2021-07-23 DIAGNOSIS — E119 Type 2 diabetes mellitus without complications: Secondary | ICD-10-CM | POA: Insufficient documentation

## 2021-07-23 DIAGNOSIS — I714 Abdominal aortic aneurysm, without rupture: Secondary | ICD-10-CM | POA: Insufficient documentation

## 2021-07-23 DIAGNOSIS — I110 Hypertensive heart disease with heart failure: Secondary | ICD-10-CM | POA: Diagnosis not present

## 2021-07-23 DIAGNOSIS — E1165 Type 2 diabetes mellitus with hyperglycemia: Secondary | ICD-10-CM

## 2021-07-23 DIAGNOSIS — I10 Essential (primary) hypertension: Secondary | ICD-10-CM

## 2021-07-23 DIAGNOSIS — C3492 Malignant neoplasm of unspecified part of left bronchus or lung: Secondary | ICD-10-CM | POA: Diagnosis not present

## 2021-07-23 DIAGNOSIS — F32A Depression, unspecified: Secondary | ICD-10-CM | POA: Diagnosis not present

## 2021-07-23 DIAGNOSIS — E1149 Type 2 diabetes mellitus with other diabetic neurological complication: Secondary | ICD-10-CM | POA: Diagnosis not present

## 2021-07-23 DIAGNOSIS — E785 Hyperlipidemia, unspecified: Secondary | ICD-10-CM

## 2021-07-23 DIAGNOSIS — E7800 Pure hypercholesterolemia, unspecified: Secondary | ICD-10-CM | POA: Diagnosis not present

## 2021-07-23 DIAGNOSIS — I2510 Atherosclerotic heart disease of native coronary artery without angina pectoris: Secondary | ICD-10-CM | POA: Diagnosis not present

## 2021-07-23 DIAGNOSIS — Z87891 Personal history of nicotine dependence: Secondary | ICD-10-CM | POA: Insufficient documentation

## 2021-07-23 DIAGNOSIS — C3490 Malignant neoplasm of unspecified part of unspecified bronchus or lung: Secondary | ICD-10-CM | POA: Diagnosis not present

## 2021-07-23 DIAGNOSIS — R918 Other nonspecific abnormal finding of lung field: Secondary | ICD-10-CM

## 2021-07-23 LAB — CBC WITH DIFFERENTIAL (CANCER CENTER ONLY)
Abs Immature Granulocytes: 0.15 10*3/uL — ABNORMAL HIGH (ref 0.00–0.07)
Basophils Absolute: 0 10*3/uL (ref 0.0–0.1)
Basophils Relative: 0 %
Eosinophils Absolute: 0 10*3/uL (ref 0.0–0.5)
Eosinophils Relative: 0 %
HCT: 36.4 % — ABNORMAL LOW (ref 39.0–52.0)
Hemoglobin: 12.3 g/dL — ABNORMAL LOW (ref 13.0–17.0)
Immature Granulocytes: 2 %
Lymphocytes Relative: 12 %
Lymphs Abs: 0.9 10*3/uL (ref 0.7–4.0)
MCH: 31.5 pg (ref 26.0–34.0)
MCHC: 33.8 g/dL (ref 30.0–36.0)
MCV: 93.1 fL (ref 80.0–100.0)
Monocytes Absolute: 1.2 10*3/uL — ABNORMAL HIGH (ref 0.1–1.0)
Monocytes Relative: 16 %
Neutro Abs: 5.1 10*3/uL (ref 1.7–7.7)
Neutrophils Relative %: 70 %
Platelet Count: 188 10*3/uL (ref 150–400)
RBC: 3.91 MIL/uL — ABNORMAL LOW (ref 4.22–5.81)
RDW: 14.2 % (ref 11.5–15.5)
WBC Count: 7.3 10*3/uL (ref 4.0–10.5)
nRBC: 0 % (ref 0.0–0.2)

## 2021-07-23 LAB — CMP (CANCER CENTER ONLY)
ALT: 17 U/L (ref 0–44)
AST: 20 U/L (ref 15–41)
Albumin: 3.6 g/dL (ref 3.5–5.0)
Alkaline Phosphatase: 89 U/L (ref 38–126)
Anion gap: 9 (ref 5–15)
BUN: 37 mg/dL — ABNORMAL HIGH (ref 8–23)
CO2: 27 mmol/L (ref 22–32)
Calcium: 10.1 mg/dL (ref 8.9–10.3)
Chloride: 104 mmol/L (ref 98–111)
Creatinine: 1.13 mg/dL (ref 0.61–1.24)
GFR, Estimated: >60 mL/min (ref >60)
Glucose, Bld: 234 mg/dL — ABNORMAL HIGH (ref 70–99)
Potassium: 4.4 mmol/L (ref 3.5–5.1)
Sodium: 140 mmol/L (ref 135–145)
Total Bilirubin: 0.6 mg/dL (ref 0.3–1.2)
Total Protein: 7.3 g/dL (ref 6.5–8.1)

## 2021-07-23 NOTE — Progress Notes (Signed)
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 current care plan.  All questions were answered. The patient knows to call the clinic with any problems, questions or concerns. We can certainly see the patient much sooner if necessary.  Thank you so much for allowing me to participate in the care of [name]. I will continue to follow up the patient with you and assist in his care.  The total time spent in the appointment was 60 minutes.  Disclaimer: This note was dictated with voice recognition software. Similar sounding words can inadvertently be transcribed and may not be corrected upon review.   Eilleen Kempf July 23, 2021, 2:24 PM

## 2021-07-24 ENCOUNTER — Encounter: Payer: Self-pay | Admitting: Physical Therapy

## 2021-07-24 ENCOUNTER — Ambulatory Visit: Payer: Medicare Other | Admitting: Physical Therapy

## 2021-07-24 ENCOUNTER — Encounter: Payer: Self-pay | Admitting: *Deleted

## 2021-07-24 DIAGNOSIS — R2689 Other abnormalities of gait and mobility: Secondary | ICD-10-CM | POA: Diagnosis not present

## 2021-07-24 DIAGNOSIS — M6281 Muscle weakness (generalized): Secondary | ICD-10-CM | POA: Diagnosis not present

## 2021-07-24 DIAGNOSIS — R2681 Unsteadiness on feet: Secondary | ICD-10-CM
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
## 2021-07-24 NOTE — Telephone Encounter (Signed)
Pt seen on 07/05/21 by Dr. Lamonte Sakai. Will close encounter.

## 2021-07-24 NOTE — Patient Instructions (Signed)
Access Code: [email or web address] URL: [email or web address] Date: 07/24/2021 Prepared by: Mady Haagensen  Exercises Seated Gluteal Sets - 1 x daily - 5 x weekly - 2 sets - 10 reps - 3 sec hold Seated Hip Adduction Isometrics with Ball - 1 x daily - 7 x weekly - 2 sets - 10 reps - 3 sec hold  Added 07/24/21 Clamshell - 1 x daily - 5 x weekly - 3 sets - 10 reps Bent Knee Fallouts - 1 x daily - 5 x weekly - 3 sets - 10 reps

## 2021-07-24 NOTE — Progress Notes (Signed)
Oncology Nurse Navigator Documentation  Oncology Nurse Navigator Flowsheets 07/24/2021  Abnormal Finding Date 02/16/2021  Confirmed Diagnosis Date 07/09/2021  Diagnosis Status Confirmed Diagnosis Complete  Planned Course of Treatment Radiation  Phase of Treatment Radiation  Navigator Follow Up Date: 07/25/2021  Navigator Follow Up Reason: Appointment Review  Navigator Location [facility]  Referral Date to RadOnc/MedOnc 07/13/2021  Navigator Encounter Type Clinic/MDC;Initial MedOnc/I met Mr and Ms. Lozito yesterday at clinic.  Mr. Binion is a newly diagnosis of lung cancer. Per Dr. Julien Nordmann, treatment plan is radiation.  Referral to rad onc is completed. I followed up on his appt today but did not see one. I did notify rad onc scheduling team of his referral to see Dr. Julien Nordmann.    Patient Visit Type MedOnc;Initial  Treatment Phase Pre-Tx/Tx Discussion  Barriers/Navigation Needs Coordination of Care;Education  Education Other;Newly Diagnosed Cancer Education  Interventions Coordination of Care;Education;Psycho-Social Support  Acuity Level 2-Minimal Needs (1-2 Barriers Identified)  Coordination of Care Other  Education Method Written;Verbal  Support Groups/Services Other  Time Spent with Patient 45

## 2021-07-25 ENCOUNTER — Other Ambulatory Visit: Payer: Self-pay

## 2021-07-25 ENCOUNTER — Ambulatory Visit (HOSPITAL_COMMUNITY)
Admission: RE | Admit: 2021-07-25 | Discharge: 2021-07-25 | Disposition: A | Discharge status: Home / Self Care | Payer: Medicare Other | Source: Ambulatory Visit | Attending: Emergency Medicine | Admitting: Emergency Medicine

## 2021-07-25 DIAGNOSIS — S069X0A Unspecified intracranial injury without loss of consciousness, initial encounter: Secondary | ICD-10-CM | POA: Diagnosis not present

## 2021-07-25 DIAGNOSIS — C3490 Malignant neoplasm of unspecified part of unspecified bronchus or lung: Secondary | ICD-10-CM | POA: Diagnosis not present

## 2021-07-25 DIAGNOSIS — J3489 Other specified disorders of nose and nasal sinuses: Secondary | ICD-10-CM | POA: Diagnosis not present

## 2021-07-25 DIAGNOSIS — I611 Nontraumatic intracerebral hemorrhage in hemisphere, cortical: Secondary | ICD-10-CM | POA: Diagnosis not present

## 2021-07-25 MED ORDER — GADOBUTROL 1 MMOL/ML IV SOLN
10.0000 mL | Freq: Once | INTRAVENOUS | Status: AC | PRN
  Administered 2021-07-25: 10 mL via INTRAVENOUS
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
## 2021-07-26 ENCOUNTER — Ambulatory Visit: Payer: Medicare Other | Admitting: Physical Therapy

## 2021-07-26 ENCOUNTER — Ambulatory Visit
Admission: RE | Admit: 2021-07-26 | Discharge: 2021-07-26 | Disposition: A | Discharge status: Home / Self Care | Payer: Medicare Other | Source: Ambulatory Visit | Attending: Radiation Oncology | Admitting: Radiation Oncology

## 2021-07-26 ENCOUNTER — Encounter: Payer: Self-pay | Admitting: Radiation Oncology

## 2021-07-26 ENCOUNTER — Other Ambulatory Visit: Payer: Self-pay

## 2021-07-26 VITALS — BP 132/76 | HR 76 | Temp 97.5°F | Resp 18 | Wt 184.8 lb

## 2021-07-26 DIAGNOSIS — I252 Old myocardial infarction: Secondary | ICD-10-CM | POA: Diagnosis not present

## 2021-07-26 DIAGNOSIS — K449 Diaphragmatic hernia without obstruction or gangrene: Secondary | ICD-10-CM | POA: Insufficient documentation

## 2021-07-26 DIAGNOSIS — K219 Gastro-esophageal reflux disease without esophagitis: Secondary | ICD-10-CM | POA: Diagnosis not present

## 2021-07-26 DIAGNOSIS — E119 Type 2 diabetes mellitus without complications: Secondary | ICD-10-CM | POA: Diagnosis not present

## 2021-07-26 DIAGNOSIS — J984 Other disorders of lung: Secondary | ICD-10-CM | POA: Insufficient documentation

## 2021-07-26 DIAGNOSIS — I110 Hypertensive heart disease with heart failure: Secondary | ICD-10-CM | POA: Diagnosis not present

## 2021-07-26 DIAGNOSIS — G629 Polyneuropathy, unspecified: Secondary | ICD-10-CM | POA: Insufficient documentation

## 2021-07-26 DIAGNOSIS — I5042 Chronic combined systolic (congestive) and diastolic (congestive) heart failure: Secondary | ICD-10-CM | POA: Diagnosis not present

## 2021-07-26 DIAGNOSIS — E7800 Pure hypercholesterolemia, unspecified: Secondary | ICD-10-CM | POA: Insufficient documentation

## 2021-07-26 DIAGNOSIS — J320 Chronic maxillary sinusitis: Secondary | ICD-10-CM | POA: Diagnosis not present

## 2021-07-26 DIAGNOSIS — I255 Ischemic cardiomyopathy: Secondary | ICD-10-CM | POA: Diagnosis not present

## 2021-07-26 DIAGNOSIS — Z79899 Other long term (current) drug therapy: Secondary | ICD-10-CM | POA: Diagnosis not present

## 2021-07-26 DIAGNOSIS — I714 Abdominal aortic aneurysm, without rupture: Secondary | ICD-10-CM | POA: Diagnosis not present

## 2021-07-26 DIAGNOSIS — M129 Arthropathy, unspecified: Secondary | ICD-10-CM | POA: Diagnosis not present

## 2021-07-26 DIAGNOSIS — M47814 Spondylosis without myelopathy or radiculopathy, thoracic region: Secondary | ICD-10-CM | POA: Diagnosis not present

## 2021-07-26 DIAGNOSIS — Z7901 Long term (current) use of anticoagulants: Secondary | ICD-10-CM | POA: Insufficient documentation

## 2021-07-26 DIAGNOSIS — I480 Paroxysmal atrial fibrillation: Secondary | ICD-10-CM | POA: Diagnosis not present

## 2021-07-26 DIAGNOSIS — N200 Calculus of kidney: Secondary | ICD-10-CM | POA: Diagnosis not present

## 2021-07-26 DIAGNOSIS — I2510 Atherosclerotic heart disease of native coronary artery without angina pectoris: Secondary | ICD-10-CM | POA: Insufficient documentation

## 2021-07-26 DIAGNOSIS — Z7984 Long term (current) use of oral hypoglycemic drugs: Secondary | ICD-10-CM | POA: Diagnosis not present

## 2021-07-26 DIAGNOSIS — Z8616 Personal history of COVID-19: Secondary | ICD-10-CM | POA: Diagnosis not present

## 2021-07-26 DIAGNOSIS — C3432 Malignant neoplasm of lower lobe, left bronchus or lung: Secondary | ICD-10-CM | POA: Diagnosis not present

## 2021-07-26 DIAGNOSIS — C3492 Malignant neoplasm of unspecified part of left bronchus or lung: Secondary | ICD-10-CM

## 2021-07-26 DIAGNOSIS — Z87891 Personal history of nicotine dependence: Secondary | ICD-10-CM | POA: Diagnosis not present
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
## 2021-07-27 ENCOUNTER — Telehealth: Payer: Self-pay | Admitting: *Deleted

## 2021-07-27 DIAGNOSIS — I5042 Chronic combined systolic (congestive) and diastolic (congestive) heart failure: Secondary | ICD-10-CM | POA: Diagnosis not present

## 2021-07-27 DIAGNOSIS — I25119 Atherosclerotic heart disease of native coronary artery with unspecified angina pectoris: Secondary | ICD-10-CM | POA: Diagnosis not present

## 2021-07-27 DIAGNOSIS — E1140 Type 2 diabetes mellitus with diabetic neuropathy, unspecified: Secondary | ICD-10-CM | POA: Diagnosis not present

## 2021-07-27 DIAGNOSIS — K219 Gastro-esophageal reflux disease without esophagitis: Secondary | ICD-10-CM | POA: Diagnosis not present

## 2021-07-27 DIAGNOSIS — I480 Paroxysmal atrial fibrillation: Secondary | ICD-10-CM | POA: Diagnosis not present

## 2021-07-27 DIAGNOSIS — E1165 Type 2 diabetes mellitus with hyperglycemia: Secondary | ICD-10-CM | POA: Diagnosis not present

## 2021-07-27 DIAGNOSIS — E7800 Pure hypercholesterolemia, unspecified: Secondary | ICD-10-CM | POA: Diagnosis not present

## 2021-07-27 DIAGNOSIS — I10 Essential (primary) hypertension: Secondary | ICD-10-CM | POA: Diagnosis not present

## 2021-07-27 NOTE — Telephone Encounter (Signed)
I followed up on Mr. Konicek schedule. He is set up for [facility] on Monday.  I called and spoke to him to see if he had any questions regarding appt.  He states rad onc was very thorough with the information and he does not have any questions at this time.  I offered support and encouragement with his treatment.

## 2021-07-27 NOTE — Progress Notes (Signed)
Has armband been applied?  Yes  Does patient have an allergy to IV contrast dye?: No   Has patient ever received premedication for IV contrast dye?: n/a  Does patient take metformin?: yes  If patient does take metformin when was the last dose: n/a  Date of lab work: 07/23/2021 BUN: 37 CR: 1.13 eGfr: >60  IV site: RAC  Has IV site been added to flowsheet?  Yes  BP (!) 110/55   Pulse 78   Temp (!) 97.1 F (36.2 C)   Resp 18   Ht 6' (1.829 m)   Wt 187 lb (84.8 kg)   SpO2 98%   BMI 25.36 kg/m    Lekia Nier M. Leonie Green, BSN

## 2021-07-28 DIAGNOSIS — S065X9A Traumatic subdural hemorrhage with loss of consciousness of unspecified duration, initial encounter: Secondary | ICD-10-CM | POA: Diagnosis not present

## 2021-07-30 ENCOUNTER — Other Ambulatory Visit: Payer: Self-pay

## 2021-07-30 ENCOUNTER — Ambulatory Visit
Admission: RE | Admit: 2021-07-30 | Discharge: 2021-07-30 | Disposition: A | Discharge status: Home / Self Care | Payer: Medicare Other | Source: Ambulatory Visit | Attending: Radiation Oncology | Admitting: Radiation Oncology

## 2021-07-30 VITALS — BP 110/55 | HR 78 | Temp 97.1°F | Resp 18 | Ht 72.0 in | Wt 187.0 lb

## 2021-07-30 DIAGNOSIS — Z510 Encounter for antineoplastic radiation therapy: Secondary | ICD-10-CM | POA: Insufficient documentation

## 2021-07-30 DIAGNOSIS — C3492 Malignant neoplasm of unspecified part of left bronchus or lung: Secondary | ICD-10-CM | POA: Insufficient documentation

## 2021-07-30 DIAGNOSIS — C3432 Malignant neoplasm of lower lobe, left bronchus or lung: Secondary | ICD-10-CM | POA: Diagnosis not present

## 2021-07-30 DIAGNOSIS — Z87891 Personal history of nicotine dependence: Secondary | ICD-10-CM | POA: Diagnosis not present

## 2021-07-30 MED ORDER — SODIUM CHLORIDE 0.9% FLUSH
10.0000 mL | Freq: Once | INTRAVENOUS | Status: AC
  Administered 2021-07-30: 10 mL via INTRAVENOUS

## 2021-07-31 ENCOUNTER — Ambulatory Visit: Payer: Medicare Other | Admitting: Physical Therapy

## 2021-07-31 ENCOUNTER — Telehealth: Payer: Self-pay | Admitting: Physical Therapy

## 2021-07-31 NOTE — Telephone Encounter (Signed)
Called and spoke with patient's wife, as pt had called last week and spoke with front office staff regarding cancelling appointments.  His appointments were still on the schedule, so PT called to clarify.  Wife would like to cancel remaining PT appointments, as pt is going to start radiation 5x/wk for next 2 weeks and needs to focus on this.  PT will plan to call pt/wife at end of October to follow up to see if he is ready for more appointments or if he needs to be discharged from therapy at that time.  Mady Haagensen, PT 07/31/21 11:20 AM Phone: [phone] Fax: [phone]

## 2021-08-02 ENCOUNTER — Ambulatory Visit: Payer: Medicare Other | Admitting: Physical Therapy

## 2021-08-03 DIAGNOSIS — Z87891 Personal history of nicotine dependence: Secondary | ICD-10-CM | POA: Diagnosis not present

## 2021-08-03 DIAGNOSIS — Z510 Encounter for antineoplastic radiation therapy: Secondary | ICD-10-CM | POA: Diagnosis not present

## 2021-08-03 DIAGNOSIS — C3492 Malignant neoplasm of unspecified part of left bronchus or lung: Secondary | ICD-10-CM | POA: Diagnosis not present

## 2021-08-03 DIAGNOSIS — C3432 Malignant neoplasm of lower lobe, left bronchus or lung: Secondary | ICD-10-CM | POA: Diagnosis not present

## 2021-08-06 ENCOUNTER — Ambulatory Visit
Admission: RE | Admit: 2021-08-06 | Discharge: 2021-08-06 | Disposition: A | Discharge status: Home / Self Care | Payer: Medicare Other | Source: Ambulatory Visit | Attending: Radiation Oncology | Admitting: Radiation Oncology

## 2021-08-06 ENCOUNTER — Other Ambulatory Visit: Payer: Self-pay

## 2021-08-06 DIAGNOSIS — Z87891 Personal history of nicotine dependence: Secondary | ICD-10-CM | POA: Diagnosis not present

## 2021-08-06 DIAGNOSIS — C3492 Malignant neoplasm of unspecified part of left bronchus or lung: Secondary | ICD-10-CM | POA: Diagnosis not present

## 2021-08-06 DIAGNOSIS — C3432 Malignant neoplasm of lower lobe, left bronchus or lung: Secondary | ICD-10-CM | POA: Diagnosis not present

## 2021-08-06 DIAGNOSIS — Z510 Encounter for antineoplastic radiation therapy: Secondary | ICD-10-CM | POA: Diagnosis not present

## 2021-08-07 ENCOUNTER — Ambulatory Visit: Payer: Medicare Other | Admitting: Physical Therapy

## 2021-08-07 ENCOUNTER — Ambulatory Visit
Admission: RE | Admit: 2021-08-07 | Discharge: 2021-08-07 | Disposition: A | Discharge status: Home / Self Care | Payer: Medicare Other | Source: Ambulatory Visit | Attending: Radiation Oncology | Admitting: Radiation Oncology

## 2021-08-07 DIAGNOSIS — Z87891 Personal history of nicotine dependence: Secondary | ICD-10-CM | POA: Diagnosis not present

## 2021-08-07 DIAGNOSIS — C3492 Malignant neoplasm of unspecified part of left bronchus or lung: Secondary | ICD-10-CM | POA: Diagnosis not present

## 2021-08-07 DIAGNOSIS — C3432 Malignant neoplasm of lower lobe, left bronchus or lung: Secondary | ICD-10-CM | POA: Diagnosis not present

## 2021-08-07 DIAGNOSIS — Z510 Encounter for antineoplastic radiation therapy: Secondary | ICD-10-CM | POA: Diagnosis not present

## 2021-08-08 ENCOUNTER — Encounter: Payer: Self-pay | Admitting: Emergency Medicine

## 2021-08-08 ENCOUNTER — Other Ambulatory Visit: Payer: Self-pay

## 2021-08-08 ENCOUNTER — Ambulatory Visit (INDEPENDENT_AMBULATORY_CARE_PROVIDER_SITE_OTHER): Payer: Medicare Other | Admitting: Emergency Medicine

## 2021-08-08 ENCOUNTER — Ambulatory Visit
Admission: RE | Admit: 2021-08-08 | Discharge: 2021-08-08 | Disposition: A | Discharge status: Home / Self Care | Payer: Medicare Other | Source: Ambulatory Visit | Attending: Radiation Oncology | Admitting: Radiation Oncology

## 2021-08-08 DIAGNOSIS — Z510 Encounter for antineoplastic radiation therapy: Secondary | ICD-10-CM | POA: Diagnosis not present

## 2021-08-08 DIAGNOSIS — C3492 Malignant neoplasm of unspecified part of left bronchus or lung: Secondary | ICD-10-CM | POA: Diagnosis not present

## 2021-08-08 DIAGNOSIS — Z87891 Personal history of nicotine dependence: Secondary | ICD-10-CM | POA: Diagnosis not present

## 2021-08-08 DIAGNOSIS — C3432 Malignant neoplasm of lower lobe, left bronchus or lung: Secondary | ICD-10-CM | POA: Diagnosis not present

## 2021-08-08 NOTE — Patient Instructions (Signed)
Please continue to follow with radiation oncology and Dr. Lisbeth Renshaw to complete your radiation treatment. Please call our office if you have any progressive shortness of breath, changes in cough, persistent blood in your mucus so that we can evaluate. Follow with Dr Lamonte Sakai in 6 months or sooner if you have any problems
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
## 2021-08-09 ENCOUNTER — Ambulatory Visit: Payer: Medicare Other | Admitting: Physical Therapy

## 2021-08-09 ENCOUNTER — Ambulatory Visit
Admission: RE | Admit: 2021-08-09 | Discharge: 2021-08-09 | Disposition: A | Discharge status: Home / Self Care | Payer: Medicare Other | Source: Ambulatory Visit | Attending: Radiation Oncology | Admitting: Radiation Oncology

## 2021-08-09 DIAGNOSIS — Z510 Encounter for antineoplastic radiation therapy: Secondary | ICD-10-CM | POA: Diagnosis not present

## 2021-08-09 DIAGNOSIS — Z87891 Personal history of nicotine dependence: Secondary | ICD-10-CM | POA: Diagnosis not present

## 2021-08-09 DIAGNOSIS — C3432 Malignant neoplasm of lower lobe, left bronchus or lung: Secondary | ICD-10-CM | POA: Diagnosis not present

## 2021-08-09 DIAGNOSIS — C3492 Malignant neoplasm of unspecified part of left bronchus or lung: Secondary | ICD-10-CM | POA: Diagnosis not present

## 2021-08-10 ENCOUNTER — Other Ambulatory Visit: Payer: Self-pay

## 2021-08-10 ENCOUNTER — Ambulatory Visit
Admission: RE | Admit: 2021-08-10 | Discharge: 2021-08-10 | Disposition: A | Discharge status: Home / Self Care | Payer: Medicare Other | Source: Ambulatory Visit | Attending: Radiation Oncology | Admitting: Radiation Oncology

## 2021-08-10 DIAGNOSIS — Z510 Encounter for antineoplastic radiation therapy: Secondary | ICD-10-CM | POA: Diagnosis not present

## 2021-08-10 DIAGNOSIS — C3432 Malignant neoplasm of lower lobe, left bronchus or lung: Secondary | ICD-10-CM | POA: Diagnosis not present

## 2021-08-10 DIAGNOSIS — C3492 Malignant neoplasm of unspecified part of left bronchus or lung: Secondary | ICD-10-CM | POA: Diagnosis not present

## 2021-08-10 DIAGNOSIS — Z87891 Personal history of nicotine dependence: Secondary | ICD-10-CM | POA: Diagnosis not present

## 2021-08-13 ENCOUNTER — Other Ambulatory Visit: Payer: Self-pay

## 2021-08-13 ENCOUNTER — Ambulatory Visit
Admission: RE | Admit: 2021-08-13 | Discharge: 2021-08-13 | Disposition: A | Discharge status: Home / Self Care | Payer: Medicare Other | Source: Ambulatory Visit | Attending: Radiation Oncology | Admitting: Radiation Oncology

## 2021-08-13 DIAGNOSIS — C3492 Malignant neoplasm of unspecified part of left bronchus or lung: Secondary | ICD-10-CM | POA: Diagnosis not present

## 2021-08-13 DIAGNOSIS — Z510 Encounter for antineoplastic radiation therapy: Secondary | ICD-10-CM | POA: Diagnosis not present

## 2021-08-13 DIAGNOSIS — C3432 Malignant neoplasm of lower lobe, left bronchus or lung: Secondary | ICD-10-CM | POA: Diagnosis not present

## 2021-08-13 DIAGNOSIS — Z87891 Personal history of nicotine dependence: Secondary | ICD-10-CM | POA: Diagnosis not present

## 2021-08-14 ENCOUNTER — Ambulatory Visit
Admission: RE | Admit: 2021-08-14 | Discharge: 2021-08-14 | Disposition: A | Discharge status: Home / Self Care | Payer: Medicare Other | Source: Ambulatory Visit | Attending: Radiation Oncology | Admitting: Radiation Oncology

## 2021-08-14 ENCOUNTER — Ambulatory Visit: Payer: Medicare Other

## 2021-08-14 DIAGNOSIS — C3432 Malignant neoplasm of lower lobe, left bronchus or lung: Secondary | ICD-10-CM | POA: Diagnosis not present

## 2021-08-14 DIAGNOSIS — Z510 Encounter for antineoplastic radiation therapy: Secondary | ICD-10-CM | POA: Diagnosis not present

## 2021-08-14 DIAGNOSIS — Z87891 Personal history of nicotine dependence: Secondary | ICD-10-CM | POA: Diagnosis not present

## 2021-08-14 DIAGNOSIS — C3492 Malignant neoplasm of unspecified part of left bronchus or lung: Secondary | ICD-10-CM | POA: Diagnosis not present

## 2021-08-15 ENCOUNTER — Other Ambulatory Visit: Payer: Self-pay

## 2021-08-15 ENCOUNTER — Ambulatory Visit
Admission: RE | Admit: 2021-08-15 | Discharge: 2021-08-15 | Disposition: A | Discharge status: Home / Self Care | Payer: Medicare Other | Source: Ambulatory Visit | Attending: Radiation Oncology | Admitting: Radiation Oncology

## 2021-08-15 DIAGNOSIS — Z510 Encounter for antineoplastic radiation therapy: Secondary | ICD-10-CM | POA: Diagnosis not present

## 2021-08-15 DIAGNOSIS — Z87891 Personal history of nicotine dependence: Secondary | ICD-10-CM | POA: Diagnosis not present

## 2021-08-15 DIAGNOSIS — C3432 Malignant neoplasm of lower lobe, left bronchus or lung: Secondary | ICD-10-CM | POA: Diagnosis not present

## 2021-08-15 DIAGNOSIS — C3492 Malignant neoplasm of unspecified part of left bronchus or lung: Secondary | ICD-10-CM | POA: Diagnosis not present

## 2021-08-16 ENCOUNTER — Ambulatory Visit
Admission: RE | Admit: 2021-08-16 | Discharge: 2021-08-16 | Disposition: A | Discharge status: Home / Self Care | Payer: Medicare Other | Source: Ambulatory Visit | Attending: Radiation Oncology | Admitting: Radiation Oncology

## 2021-08-16 DIAGNOSIS — Z87891 Personal history of nicotine dependence: Secondary | ICD-10-CM | POA: Diagnosis not present

## 2021-08-16 DIAGNOSIS — C3492 Malignant neoplasm of unspecified part of left bronchus or lung: Secondary | ICD-10-CM | POA: Diagnosis not present

## 2021-08-16 DIAGNOSIS — Z510 Encounter for antineoplastic radiation therapy: Secondary | ICD-10-CM | POA: Diagnosis not present

## 2021-08-16 DIAGNOSIS — C3432 Malignant neoplasm of lower lobe, left bronchus or lung: Secondary | ICD-10-CM | POA: Diagnosis not present

## 2021-08-17 ENCOUNTER — Other Ambulatory Visit: Payer: Self-pay

## 2021-08-17 ENCOUNTER — Ambulatory Visit
Admission: RE | Admit: 2021-08-17 | Discharge: 2021-08-17 | Disposition: A | Discharge status: Home / Self Care | Payer: Medicare Other | Source: Ambulatory Visit | Attending: Radiation Oncology | Admitting: Radiation Oncology

## 2021-08-17 ENCOUNTER — Encounter: Payer: Self-pay | Admitting: Radiation Oncology

## 2021-08-17 DIAGNOSIS — Z87891 Personal history of nicotine dependence: Secondary | ICD-10-CM | POA: Diagnosis not present

## 2021-08-17 DIAGNOSIS — Z510 Encounter for antineoplastic radiation therapy: Secondary | ICD-10-CM | POA: Diagnosis not present

## 2021-08-17 DIAGNOSIS — C3492 Malignant neoplasm of unspecified part of left bronchus or lung: Secondary | ICD-10-CM | POA: Diagnosis not present

## 2021-08-17 DIAGNOSIS — C3432 Malignant neoplasm of lower lobe, left bronchus or lung: Secondary | ICD-10-CM | POA: Diagnosis not present

## 2021-08-21 DIAGNOSIS — E119 Type 2 diabetes mellitus without complications: Secondary | ICD-10-CM | POA: Diagnosis not present

## 2021-08-26 NOTE — Progress Notes (Signed)
                                                                                                                                                             Patient Name: [name] MRN: [id number] DOB: [birth year] Referring Physician: Curt Bears (Profile Not Attached) Date of Service: 08/17/2021  [facility] [place]                                                        End Of Treatment Note  Diagnoses: C34.32-Malignant neoplasm of lower lobe, left bronchus or lung  Cancer Staging: Stage IIB, cT3N0M0, NSCLC, squamous cell carcinoma of the LLL.  Intent: Curative  Radiation Treatment Dates: 08/06/2021 through 08/17/2021 SBRT Style Treatment Site Technique Total Dose (Gy) Dose per Fx (Gy) Completed Fx Beam Energies  Lung, Left: Lung_Lt IMRT 60/60 6 10/10 [id number]   Narrative: The patient tolerated radiation therapy relatively well.    Plan: The patient will receive a call in about one month from the radiation oncology department. He will continue follow up with Dr. Julien Nordmann as well.   ________________________________________________    Carola Rhine, [facility]

## 2021-08-28 DIAGNOSIS — S065X9A Traumatic subdural hemorrhage with loss of consciousness of unspecified duration, initial encounter: Secondary | ICD-10-CM | POA: Diagnosis not present

## 2021-08-29 DIAGNOSIS — L720 Epidermal cyst: Secondary | ICD-10-CM | POA: Diagnosis not present
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
## 2021-09-06 ENCOUNTER — Other Ambulatory Visit: Payer: Self-pay

## 2021-09-06 ENCOUNTER — Encounter: Payer: Self-pay | Admitting: Interventional Cardiology

## 2021-09-06 ENCOUNTER — Ambulatory Visit (INDEPENDENT_AMBULATORY_CARE_PROVIDER_SITE_OTHER): Payer: Medicare Other | Admitting: Interventional Cardiology

## 2021-09-06 VITALS — BP 114/54 | HR 78 | Ht 72.0 in | Wt 189.2 lb

## 2021-09-06 DIAGNOSIS — I10 Essential (primary) hypertension: Secondary | ICD-10-CM

## 2021-09-06 DIAGNOSIS — Z79899 Other long term (current) drug therapy: Secondary | ICD-10-CM

## 2021-09-06 DIAGNOSIS — I2510 Atherosclerotic heart disease of native coronary artery without angina pectoris: Secondary | ICD-10-CM | POA: Diagnosis not present

## 2021-09-06 DIAGNOSIS — E7849 Other hyperlipidemia: Secondary | ICD-10-CM | POA: Diagnosis not present

## 2021-09-06 DIAGNOSIS — I5042 Chronic combined systolic (congestive) and diastolic (congestive) heart failure: Secondary | ICD-10-CM

## 2021-09-06 DIAGNOSIS — Z7901 Long term (current) use of anticoagulants: Secondary | ICD-10-CM | POA: Diagnosis not present

## 2021-09-06 DIAGNOSIS — I480 Paroxysmal atrial fibrillation: Secondary | ICD-10-CM

## 2021-09-06 NOTE — Patient Instructions (Signed)
Medication Instructions:  Your physician recommends that you continue on your current medications as directed. Please refer to the Current Medication list given to you today.  *If you need a refill on your cardiac medications before your next appointment, please call your pharmacy*   Lab Work: None If you have labs (blood work) drawn today and your tests are completely normal, you will receive your results only by: [place] (if you have MyChart) OR A paper copy in the mail If you have any lab test that is abnormal or we need to change your treatment, we will call you to review the results.   Testing/Procedures: None   Follow-Up: At [facility], you and your health needs are our priority.  As part of our continuing mission to provide you with exceptional heart care, we have created designated Provider Care Teams.  These Care Teams include your primary Cardiologist (physician) and Advanced Practice Providers (APPs -  Physician Assistants and Nurse Practitioners) who all work together to provide you with the care you need, when you need it.  We recommend signing up for the patient portal called "MyChart".  Sign up information is provided on this After Visit Summary.  MyChart is used to connect with patients for Virtual Visits (Telemedicine).  Patients are able to view lab/test results, encounter notes, upcoming appointments, etc.  Non-urgent messages can be sent to your provider as well.   To learn more about what you can do with MyChart, go to [email or web address].    Your next appointment:   6-9 month(s)  The format for your next appointment:   In Person  Provider:   Sinclair Grooms, MD     Other Instructions

## 2021-09-10 DIAGNOSIS — Z0000 Encounter for general adult medical examination without abnormal findings: Secondary | ICD-10-CM | POA: Diagnosis not present

## 2021-09-10 DIAGNOSIS — E1140 Type 2 diabetes mellitus with diabetic neuropathy, unspecified: Secondary | ICD-10-CM | POA: Diagnosis not present

## 2021-09-10 DIAGNOSIS — E7800 Pure hypercholesterolemia, unspecified: Secondary | ICD-10-CM | POA: Diagnosis not present

## 2021-09-10 DIAGNOSIS — D6869 Other thrombophilia: Secondary | ICD-10-CM | POA: Diagnosis not present

## 2021-09-10 DIAGNOSIS — I10 Essential (primary) hypertension: Secondary | ICD-10-CM | POA: Diagnosis not present

## 2021-09-10 DIAGNOSIS — I25119 Atherosclerotic heart disease of native coronary artery with unspecified angina pectoris: Secondary | ICD-10-CM | POA: Diagnosis not present

## 2021-09-10 DIAGNOSIS — I480 Paroxysmal atrial fibrillation: Secondary | ICD-10-CM | POA: Diagnosis not present

## 2021-09-10 DIAGNOSIS — G629 Polyneuropathy, unspecified: Secondary | ICD-10-CM | POA: Diagnosis not present

## 2021-09-10 DIAGNOSIS — K219 Gastro-esophageal reflux disease without esophagitis: Secondary | ICD-10-CM | POA: Diagnosis not present

## 2021-09-10 DIAGNOSIS — Z1389 Encounter for screening for other disorder: Secondary | ICD-10-CM | POA: Diagnosis not present

## 2021-09-10 DIAGNOSIS — C3492 Malignant neoplasm of unspecified part of left bronchus or lung: Secondary | ICD-10-CM | POA: Diagnosis not present

## 2021-09-14 IMAGING — CT CT RENAL STONE PROTOCOL
2 of 4 series · 15 of 46 positions shown, 17 images · non-contrast
Comparison: Radiograph 07/08/2019, CT 07/04/2019

CLINICAL DATA: Syncopal event

EXAM:
CT ABDOMEN AND PELVIS WITHOUT CONTRAST
TECHNIQUE: Multidetector CT imaging of the abdomen and pelvis was performed
following the standard protocol without IV contrast.

[Series 3: renal stone 5.0 (person_name) · axial · 0.98mm/px · z∈[+879,+1354]mm · 12 of 105 slices shown, 14 images]
[im 5/105  soft-tissue]
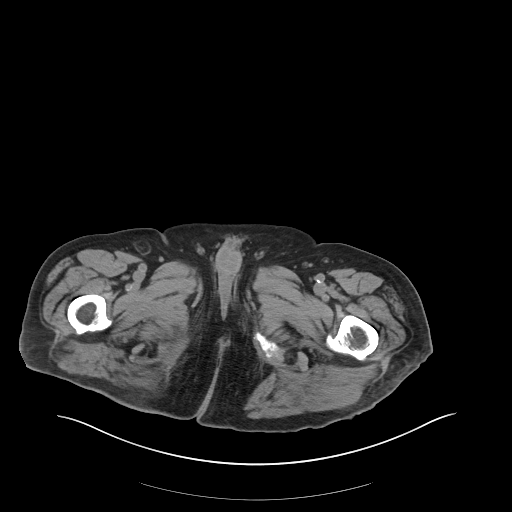
[im 5/105  bone]
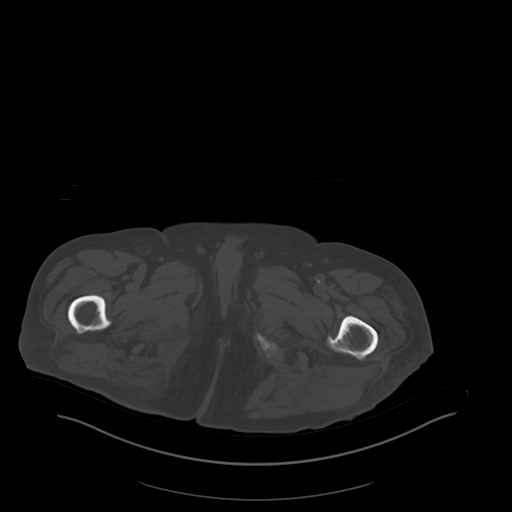
[im 14/105  soft-tissue]
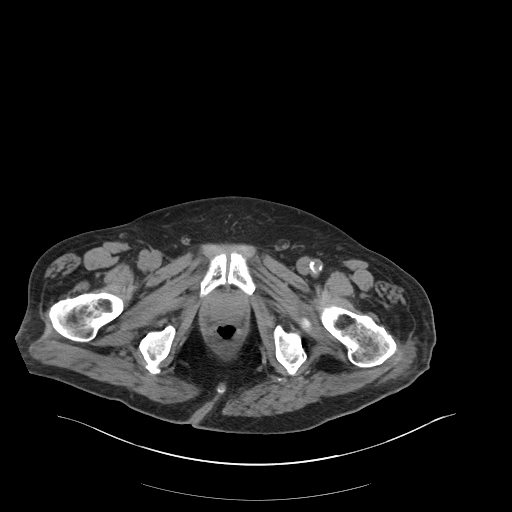
[im 22/105  soft-tissue]
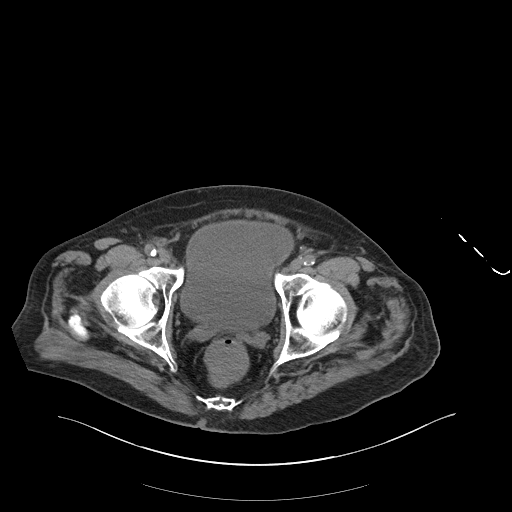
[im 31/105  soft-tissue]
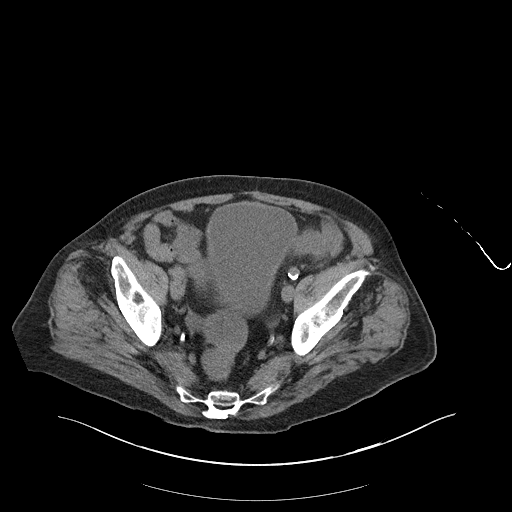
[im 40/105  soft-tissue]
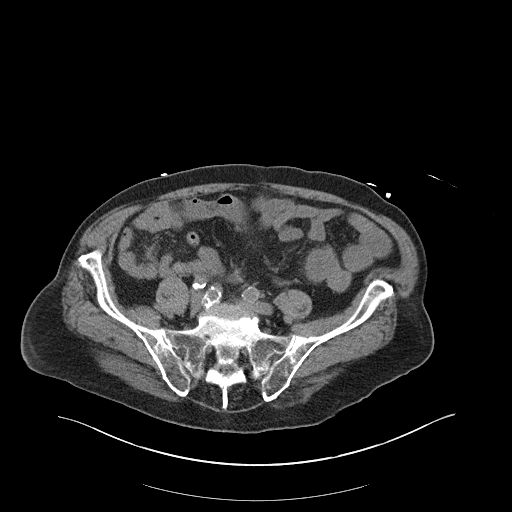
[im 48/105  soft-tissue]
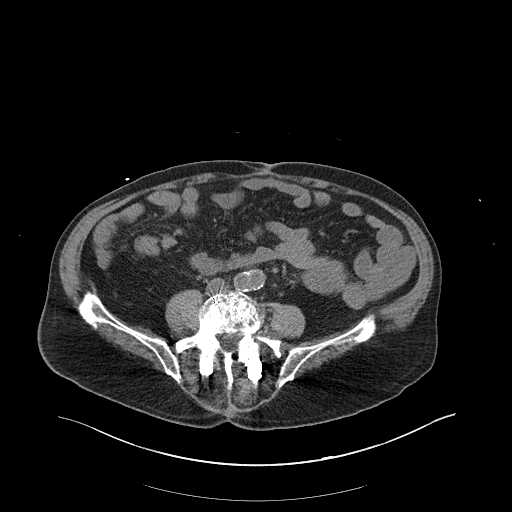
[im 57/105  soft-tissue]
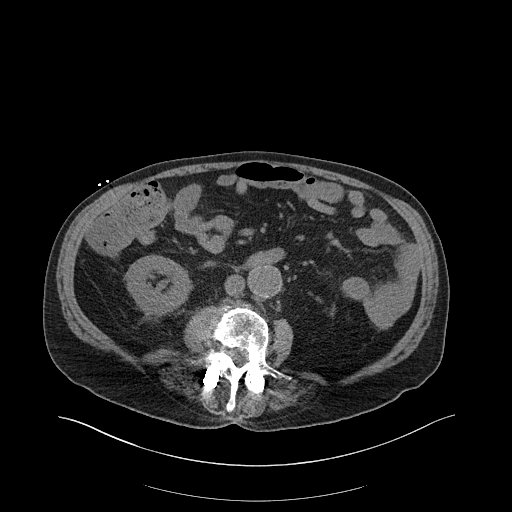
[im 66/105  soft-tissue]
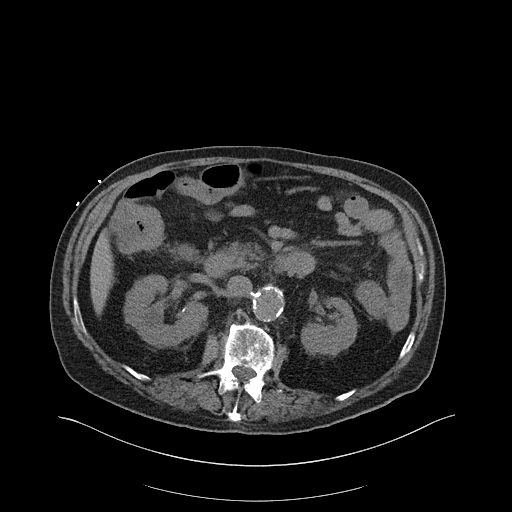
[im 74/105  soft-tissue]
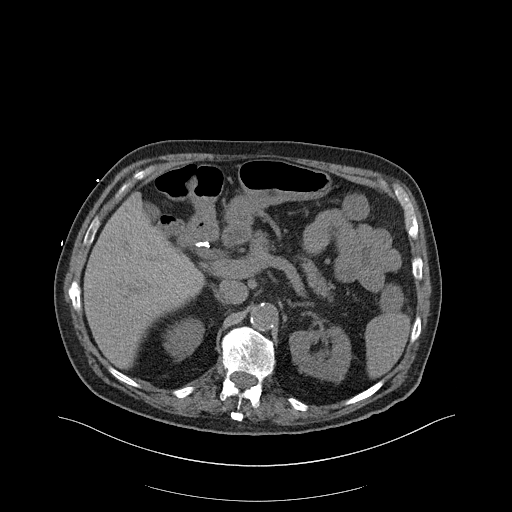
[im 74/105  bone]
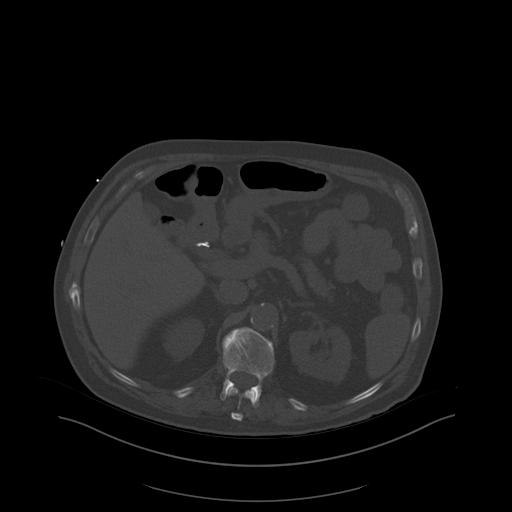
[im 83/105  soft-tissue]
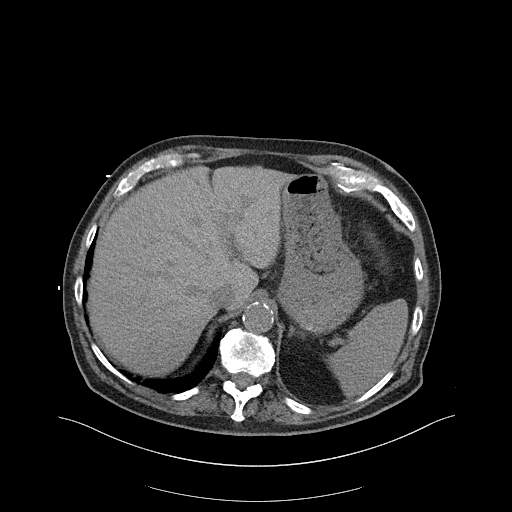
[im 92/105  soft-tissue]
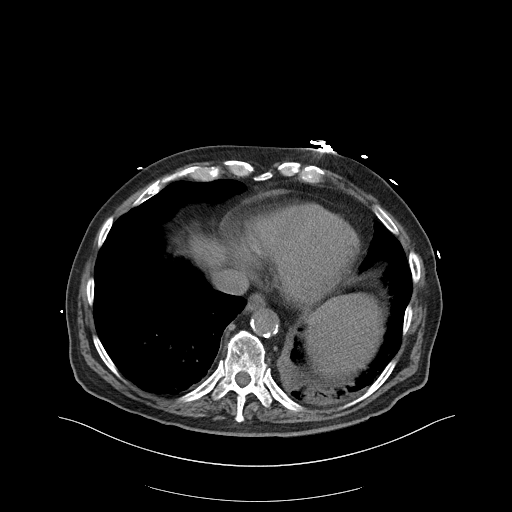
[im 100/105  soft-tissue]
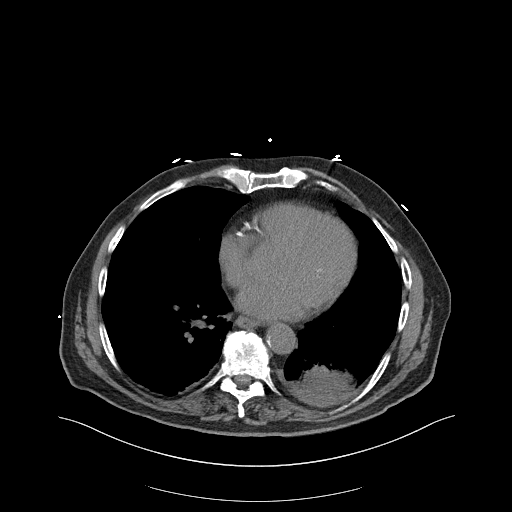

[Series 6: coronal · coronal · 1.01mm/px · 3 of 115 slices shown]
[im 39/115  soft-tissue]
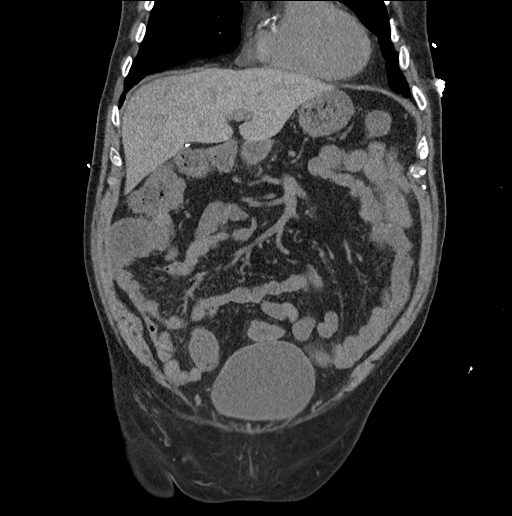
[im 51/115  soft-tissue]
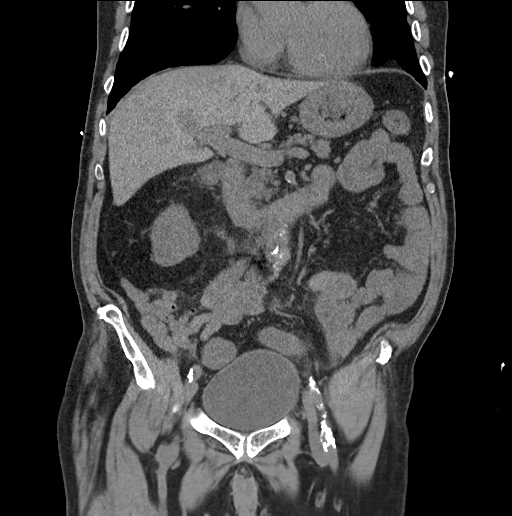
[im 64/115  soft-tissue]
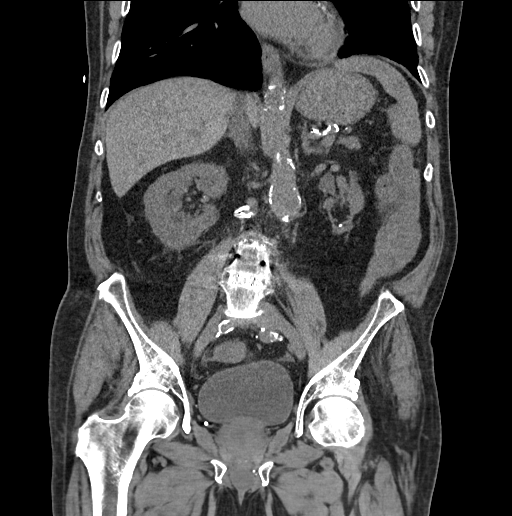

[15 of 46 positions shown; findings below may reference images not displayed]

FINDINGS: Lower chest: There is a dense consolidative opacity in the left lung
base with trace left pleural effusion and some surrounding
atelectasis. The right lung base is predominantly clear aside from
some basilar atelectatic changes as well. Normal heart size. No
pericardial effusion. Extensive coronary artery calcifications.

Hepatobiliary: No focal liver abnormality is seen. Patient is post
cholecystectomy. Slight prominence of the biliary tree likely
related to reservoir effect. No calcified intraductal gallstones.

Pancreas: Unremarkable. No pancreatic ductal dilatation or
surrounding inflammatory changes.

Spleen: Normal in size without focal abnormality.

Adrenals/Urinary Tract: Normal adrenal glands. Mild bilateral
symmetric perinephric stranding, a nonspecific finding though may
correlate with either age or decreased renal function. Asymmetric
left renal atrophy with extensive areas of cortical scarring.
Several fluid attenuation cysts are noted in the right kidney as
well as a more intermediate partially exophytic 2.2 cm lesion
arising from the posterior interpolar right kidney (3/45).
Nonobstructing calculus present in the collecting system of the left
lower pole. Additional vascular calcifications noted in both renal
pelves. No hydronephrosis or obstructive urolithiasis. Mild bladder
distension without other gross abnormality.

Stomach/Bowel: Distal esophagus, stomach and duodenal sweep are
unremarkable. No small bowel wall thickening or dilatation. No
evidence of obstruction. A normal appendix is visualized. Proximal
colon is unremarkable. There is a focally thickened segment of the
distal descending colon and proximal sigmoid with surrounding
pericolonic fat stranding which does not appear centered upon a
focal diverticulum.

Vascular/Lymphatic: Extensive atherosclerotic calcification of the
abdominal aorta and branch vessels. Slight fusiform ectasia of the
infrarenal abdominal aorta is likely related to prior abdominal
aortic aneurysm repair incompletely assessed on this exam
examination in the absence of intravenous contrast media. Some
reactive adenopathy noted in the low pelvis and mesentery.

Reproductive: Prostate at the upper limits of normal for size. No
acute CT abnormality.

Other: No bowel containing hernias. No abdominopelvic free air.
Small volume free fluid in the pelvis, favor reactive given simple
attenuation (-1 HU).

Musculoskeletal: Prior L3-L5 posterior spinal decompression and
fusion with interbody spacer placement. No evidence of acute
hardware complication or failure.
IMPRESSION: 1. Focally thickened segment of the distal descending colon and
proximal sigmoid with surrounding pericolonic fat stranding which
does not appear centered upon a focal diverticulum. Findings are
favored to represent an acute infectious, inflammatory or vascular
colitis.
2. Small volume free fluid in the pelvis, favor reactive given
simple attenuation (-1 HU).
3. Dense consolidative opacity in the left lung base with trace left
pleural effusion and some surrounding atelectasis. Findings are
concerning for pneumonia. Recommend follow-up chest CT in 6-8 weeks
following appropriate therapy to ensure resolution.
4. Asymmetric left renal atrophy with extensive areas of cortical
scarring. Nonobstructing left nephrolithiasis.
5. Extensive coronary artery calcifications.
6. Postsurgical changes of prior abdominal aortic aneurysm repair
incompletely assessed on this exam in the absence of intravenous
contrast media.
7. Prior L3-L5 posterior spinal decompression and fusion with
interbody spacer placement. No evidence of acute hardware
complication or failure.
8. Aortic Atherosclerosis ([id number]).

## 2021-09-16 IMAGING — DX DG ABDOMEN 1V
2 series · 2 of 2 positions shown · non-contrast
Comparison: CT Abdomen and Pelvis 03/09/2020.

CLINICAL DATA: 78-year-old male with abdominal distension.

EXAM:
ABDOMEN - 1 VIEW

[abdomen kub (1 of 2)]
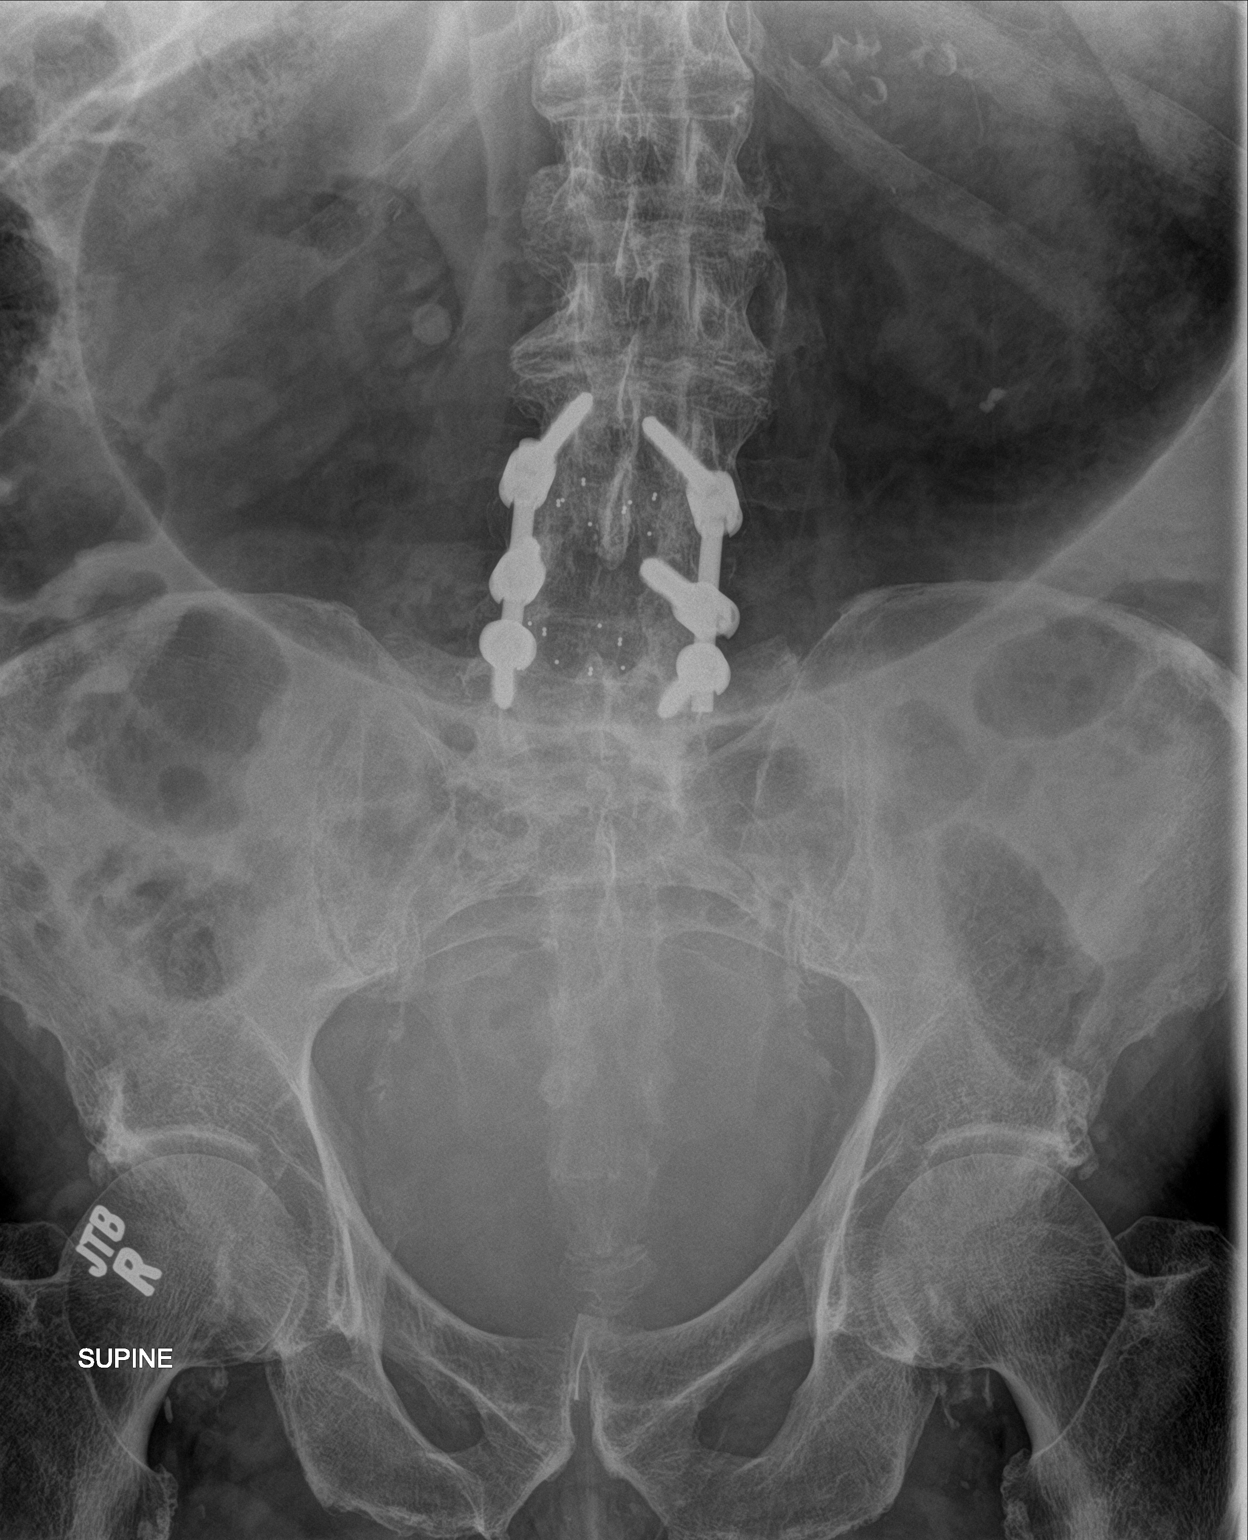

[abdomen kub (2 of 2)]
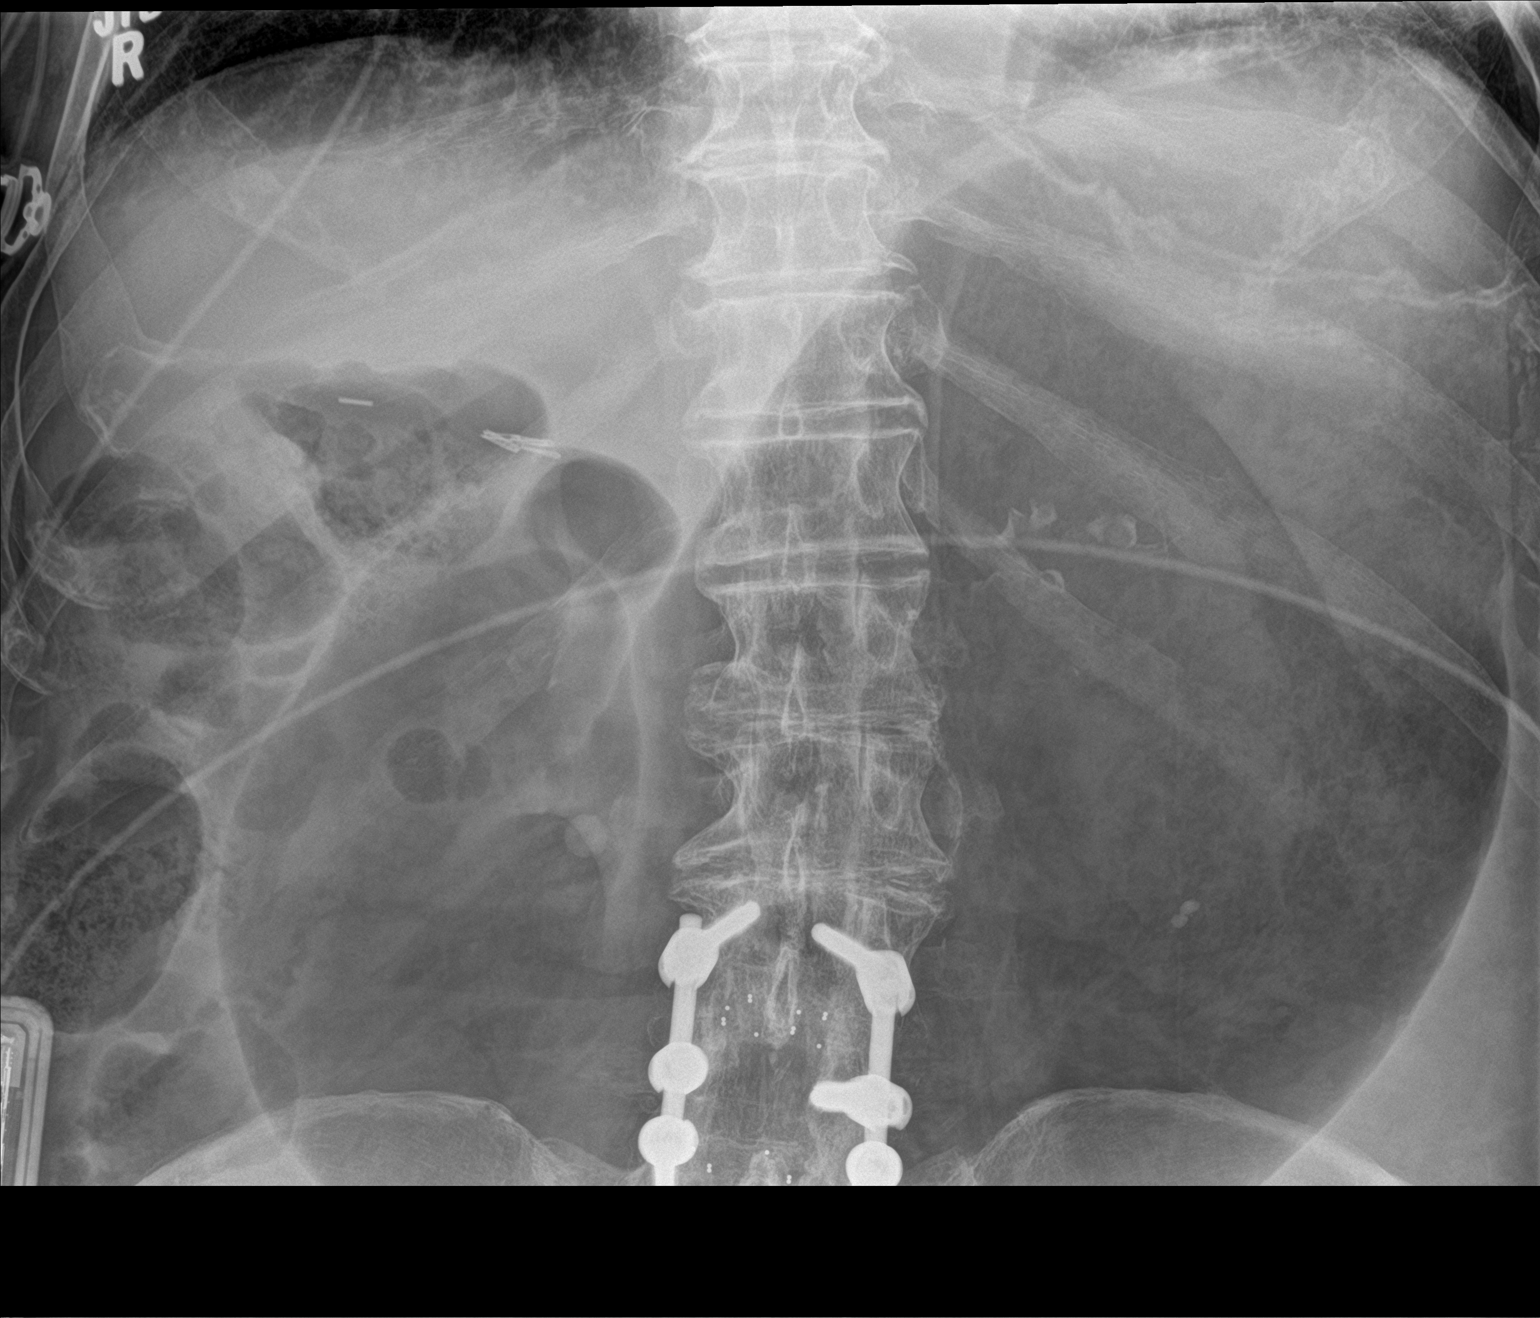

[2 of 2 positions shown; findings below may reference images not displayed]

FINDINGS: Supine views of the abdomen. New severe gaseous distension of the
stomach from the CT 2 days ago.

There is superimposed gas and other nondilated bowel loops in the
abdomen and pelvis. Stable cholecystectomy clips.

No acute osseous abnormality identified. Previous lower lumbar
decompression and fusion. Vascular calcifications in the abdomen and
pelvis.
IMPRESSION: Severe gaseous distension of the stomach is new from the recent CT
Abdomen and Pelvis. Recommend NG tube decompression.

## 2021-09-18 IMAGING — CT CT ABD-PELV W/ CM
2 of 5 series · 16 of 46 positions shown, 18 images · IV contrast (APPLIED)
Comparison: Abdomen radiographs dated 02/10/2020. Abdomen and
pelvis CT dated 02/08/2019

CLINICAL DATA: Abdominal distension. Clinical suspicion for
pseudo-obstruction.

EXAM:
CT ABDOMEN AND PELVIS WITH CONTRAST
TECHNIQUE: Multidetector CT imaging of the abdomen and pelvis was performed
using the standard protocol following bolus administration of
intravenous contrast.
CONTRAST:  100mL OMNIPAQUE IOHEXOL 300 MG/ML  SOLN

[Series 3: abd/ pelvis 5.0 i30f 2 (person_name) · axial · 0.91mm/px · z∈[-625,-145]mm · 13 of 108 slices shown, 15 images]
[im 6/108  soft-tissue]
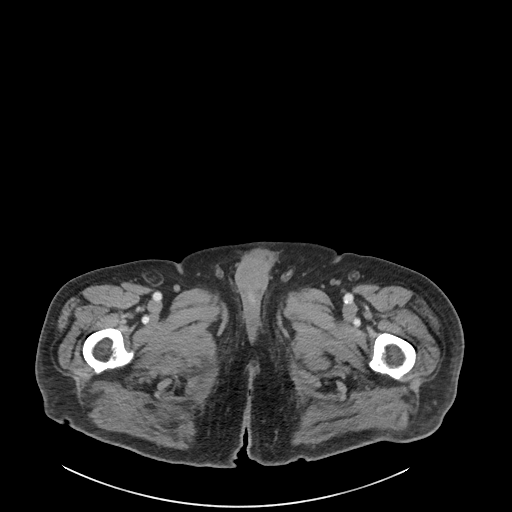
[im 6/108  bone]
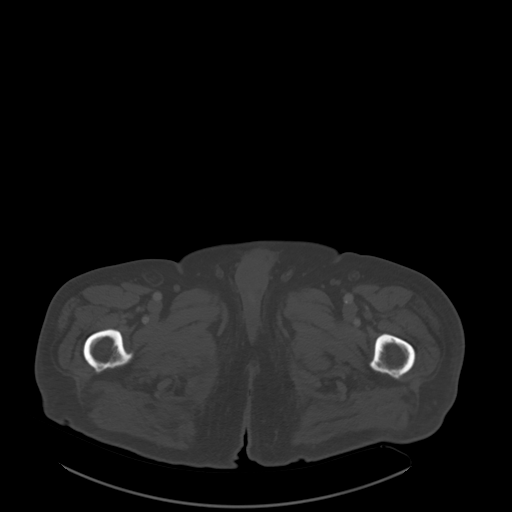
[im 12/108  soft-tissue]
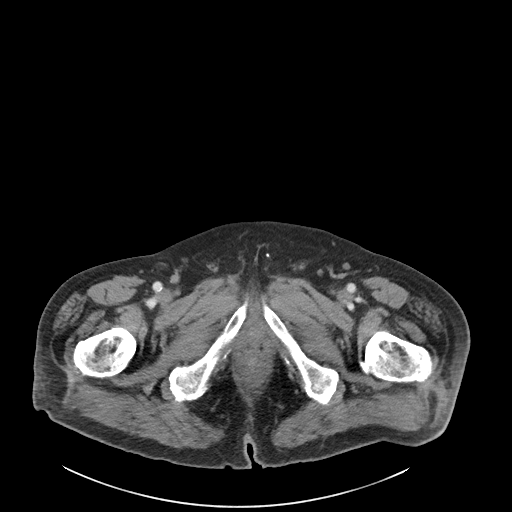
[im 24/108  soft-tissue]
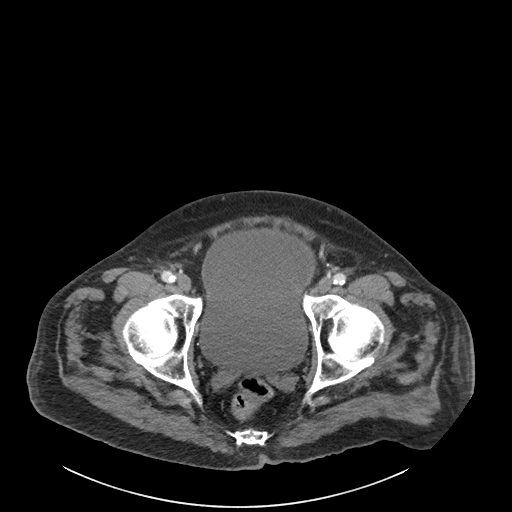
[im 30/108  soft-tissue]
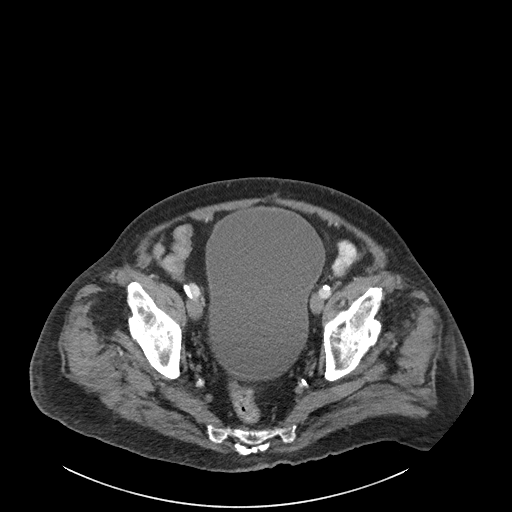
[im 36/108  soft-tissue]
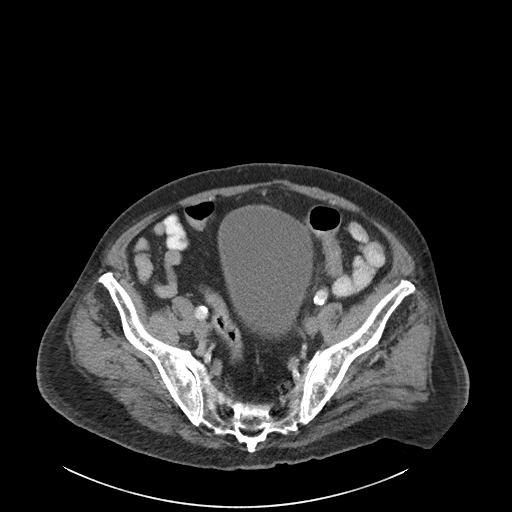
[im 48/108  soft-tissue]
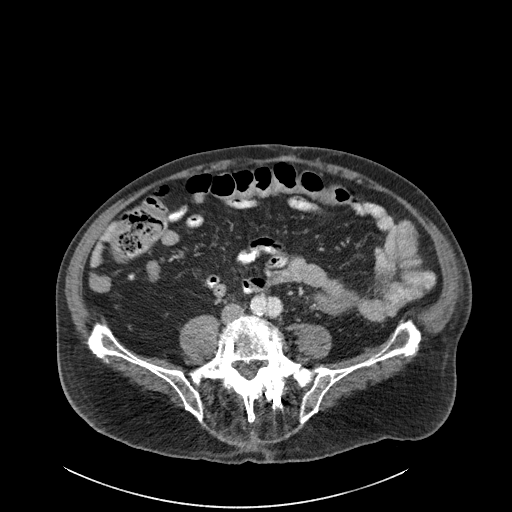
[im 54/108  soft-tissue]
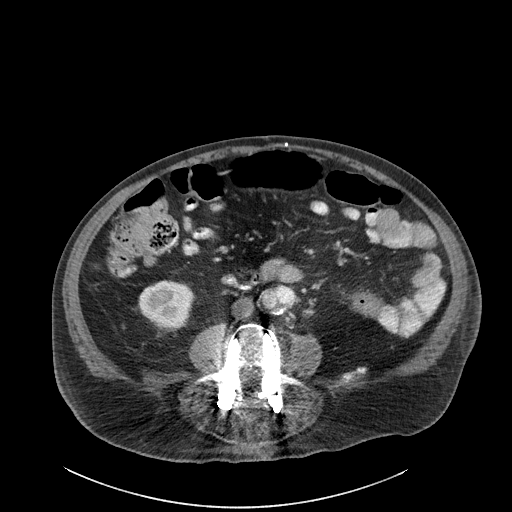
[im 60/108  soft-tissue]
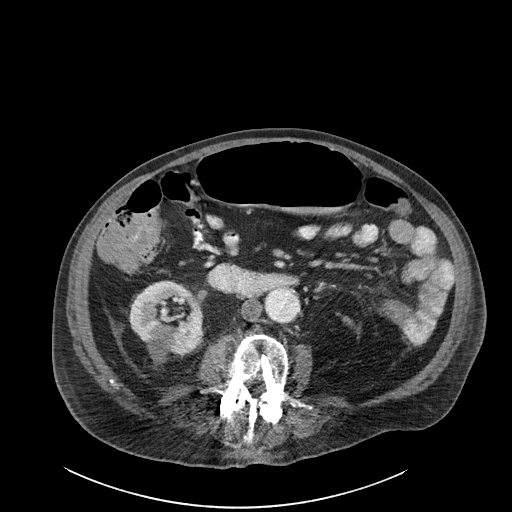
[im 72/108  soft-tissue]
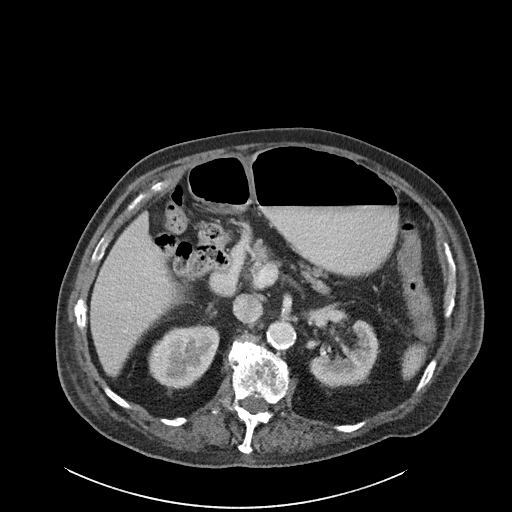
[im 72/108  bone]
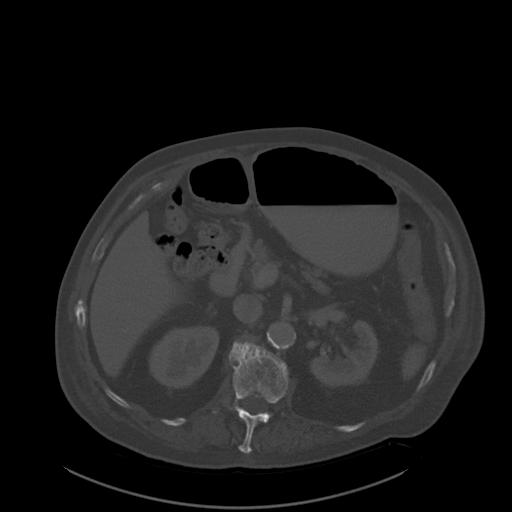
[im 78/108  soft-tissue]
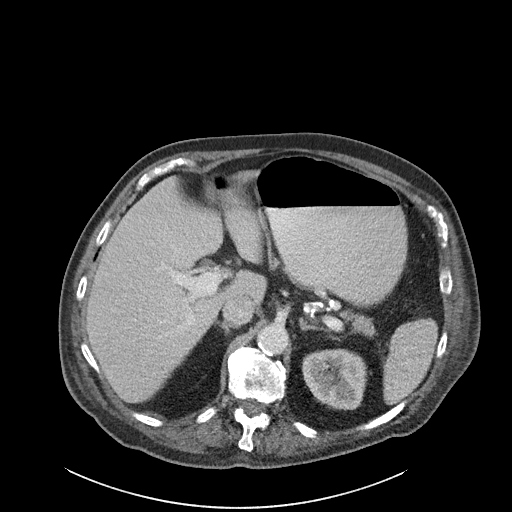
[im 84/108  soft-tissue]
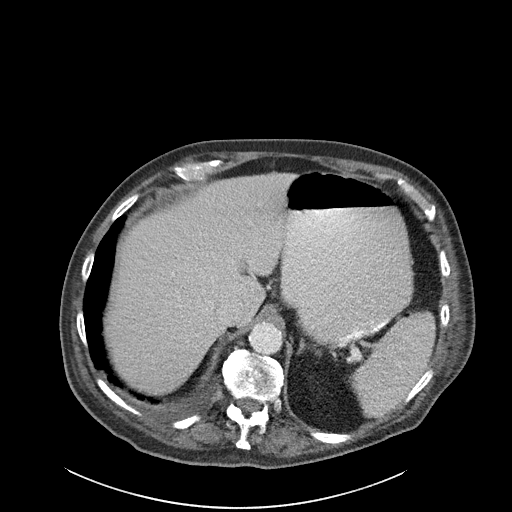
[im 96/108  soft-tissue]
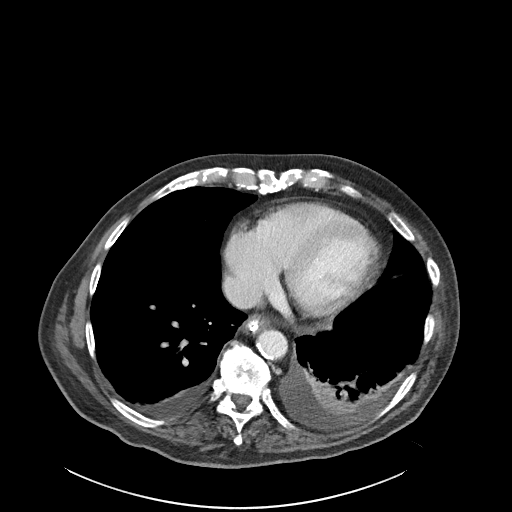
[im 102/108  soft-tissue]
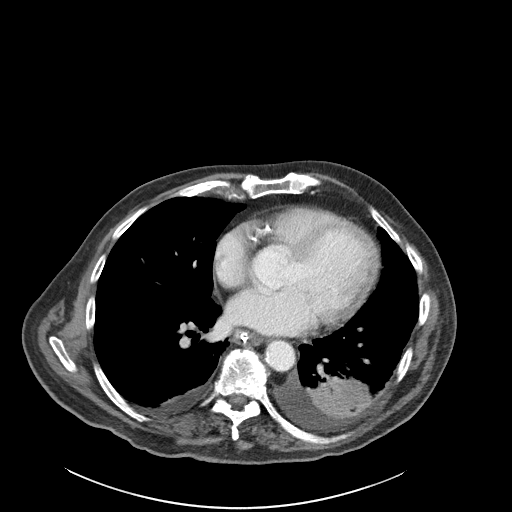

[Series 6: coronal soft tissue · coronal · 0.95mm/px · 3 of 117 slices shown]
[im 39/117  soft-tissue]
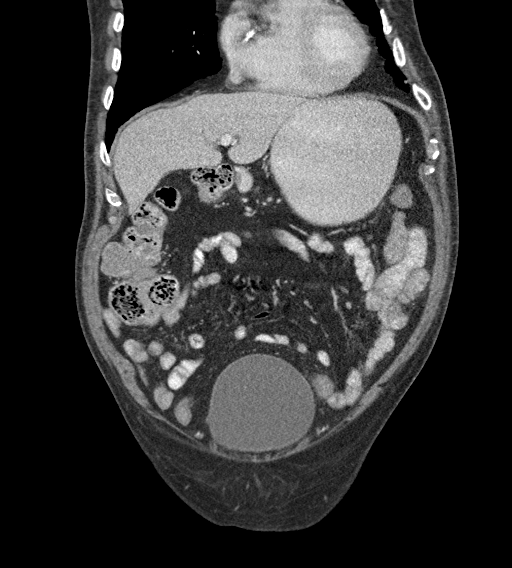
[im 52/117  soft-tissue]
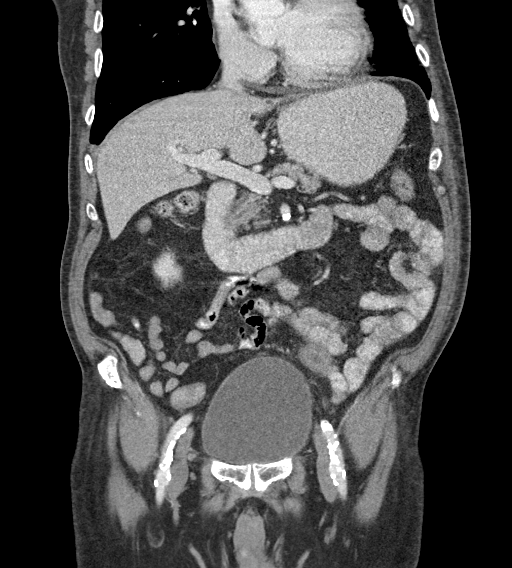
[im 65/117  soft-tissue]
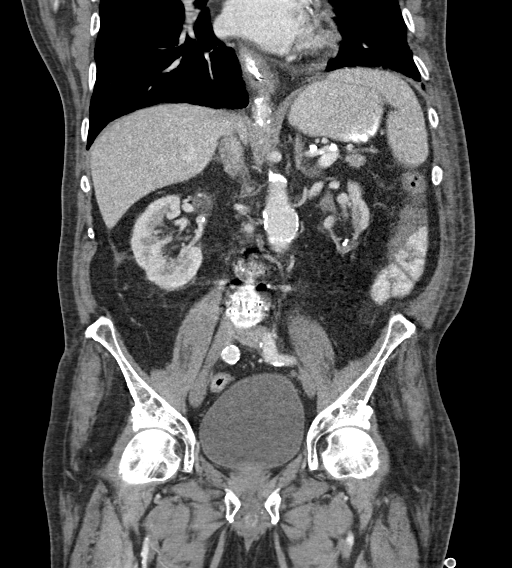

[16 of 46 positions shown; findings below may reference images not displayed]

FINDINGS: Lower chest: Interval small bilateral pleural effusions, larger on
the left. Associated left lower lobe compressive atelectasis and
minimal right lower lobe compressive atelectasis. Mild cardiomegaly
with a mild increase in size of the heart. Interval nasogastric tube
with its tip in the distal esophagus.

Hepatobiliary: No focal liver abnormality is seen. Status post
cholecystectomy. No biliary dilatation.

Pancreas: Unremarkable. No pancreatic ductal dilatation or
surrounding inflammatory changes.

Spleen: Normal in size without focal abnormality.

Adrenals/Urinary Tract: Normal appearing adrenal glands. Right renal
cysts. 7 mm calculus in an area of cortical scarring in the lower
pole of the left kidney. No bladder or ureteral calculi. The urinary
bladder is moderately dilated.

Stomach/Bowel: Dilated stomach with mild improvement since
02/10/2020. Unremarkable small bowel, colon and appendix.

Vascular/Lymphatic: Atheromatous arterial calcifications without
aneurysm. No enlarged lymph nodes. An aorto bi-iliac graft is in
place the infrarenal abdominal aorta remains mildly dilated with a
maximum AP diameter 3.7 mm, previously 3.9 cm in corresponding
diameter. No enlarged lymph nodes.

Reproductive: Mildly enlarged prostate gland.

Other: No abdominal wall hernia or abnormality. No abdominopelvic
ascites.

Musculoskeletal: Lumbar and lower thoracic spine degenerative
changes. Interbody and pedicle screw and rod fusion at the L3
through L5 levels.
IMPRESSION: 1. Dilated stomach with mild improvement since 02/10/2020.
[DATE]. Nasogastric tube tip in the distal esophagus. This needs to be
advanced into the stomach.
3. Interval small bilateral pleural effusions, larger on the left.
4. Associated left lower lobe compressive atelectasis and minimal
right lower lobe compressive atelectasis.
5. 7 mm nonobstructing left renal calculus in an area of cortical
scarring.

## 2021-09-19 IMAGING — DX DG ABDOMEN 1V
3 series · 3 of 3 positions shown · non-contrast
Comparison: 02/10/2020. Abdomen and pelvis CT dated 02/12/2020

CLINICAL DATA: Abdominal distension.

EXAM:
ABDOMEN - 1 VIEW

[abdomen kub (1 of 3)]
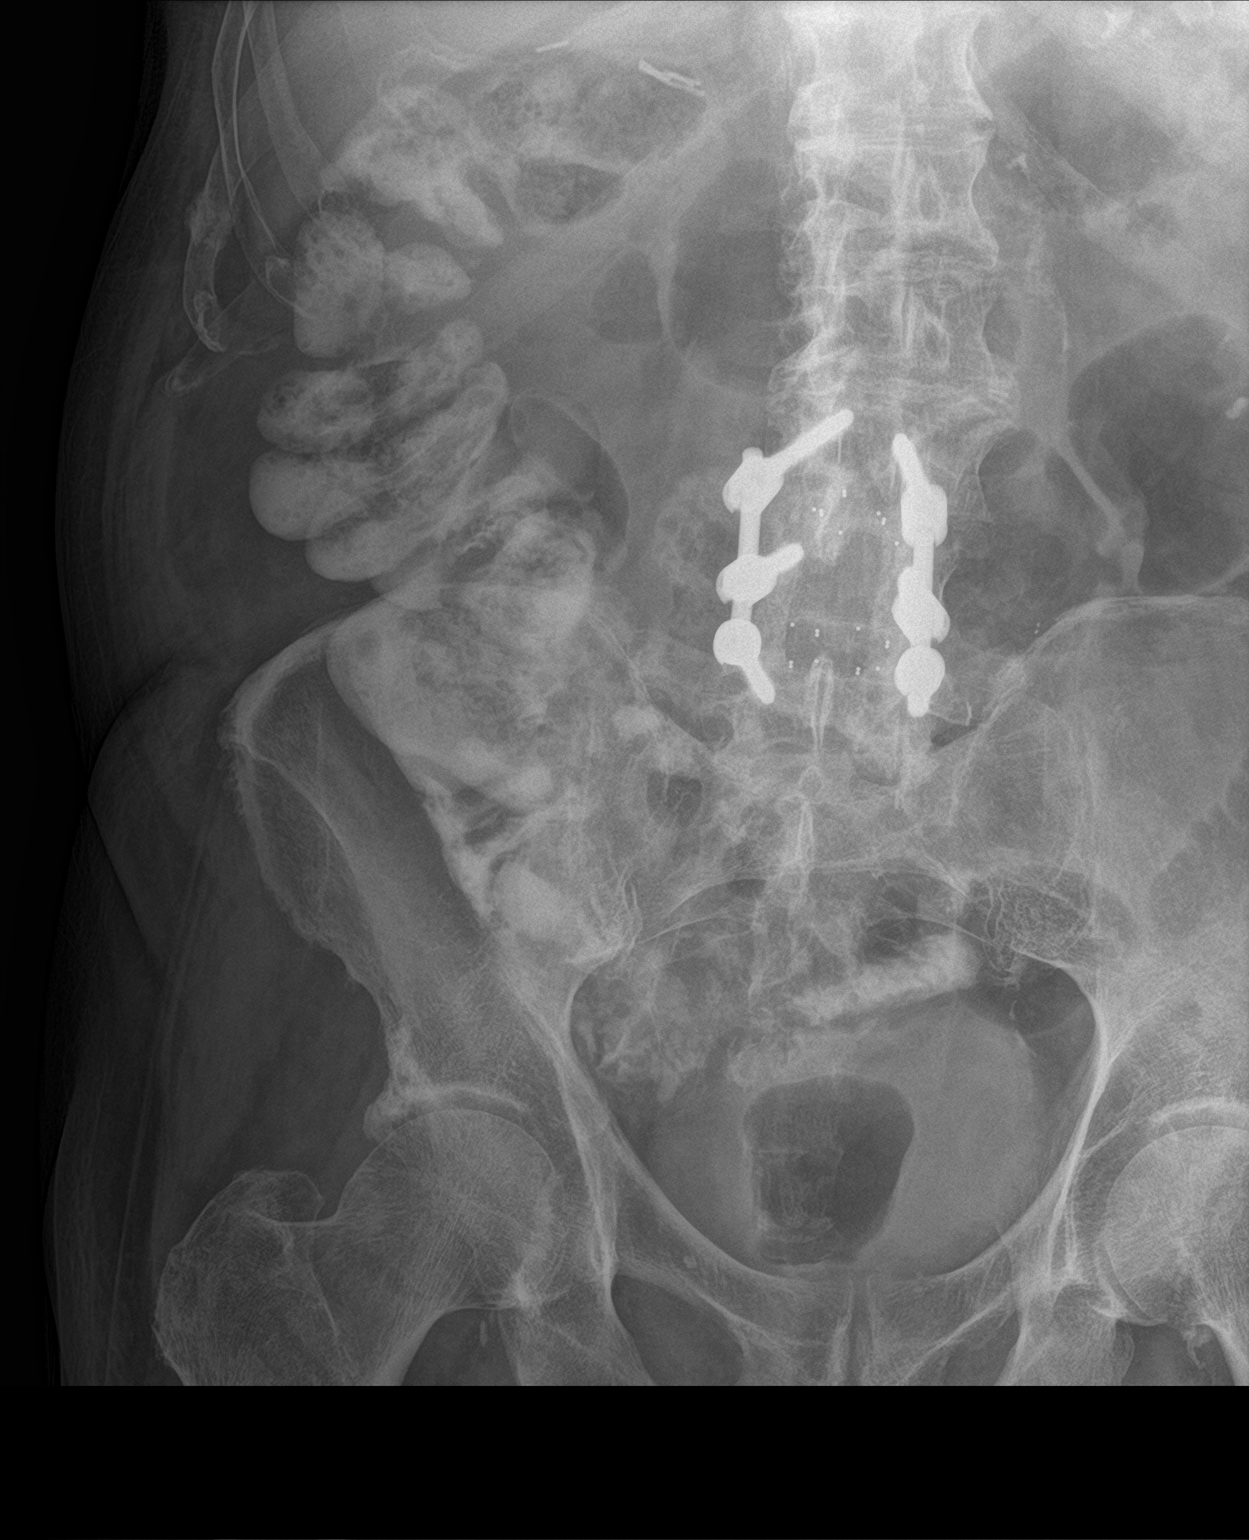

[abdomen kub (2 of 3)]
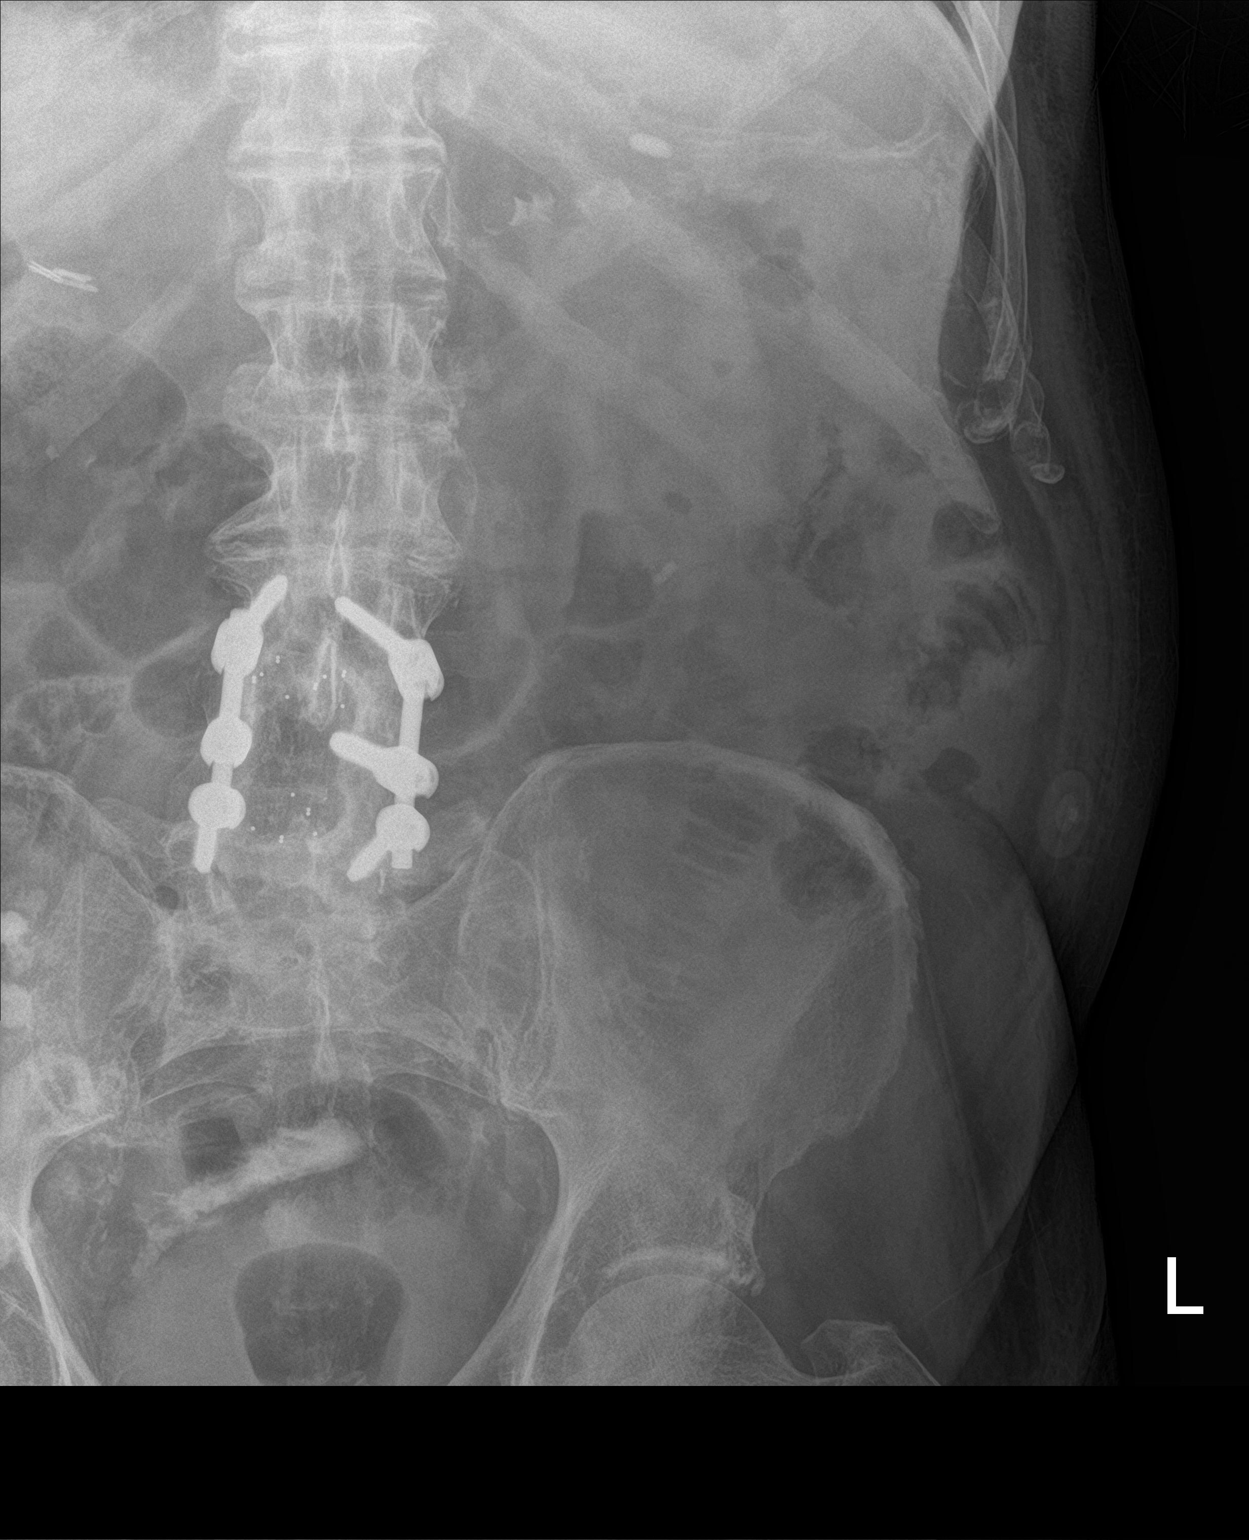

[abdomen kub (3 of 3)]
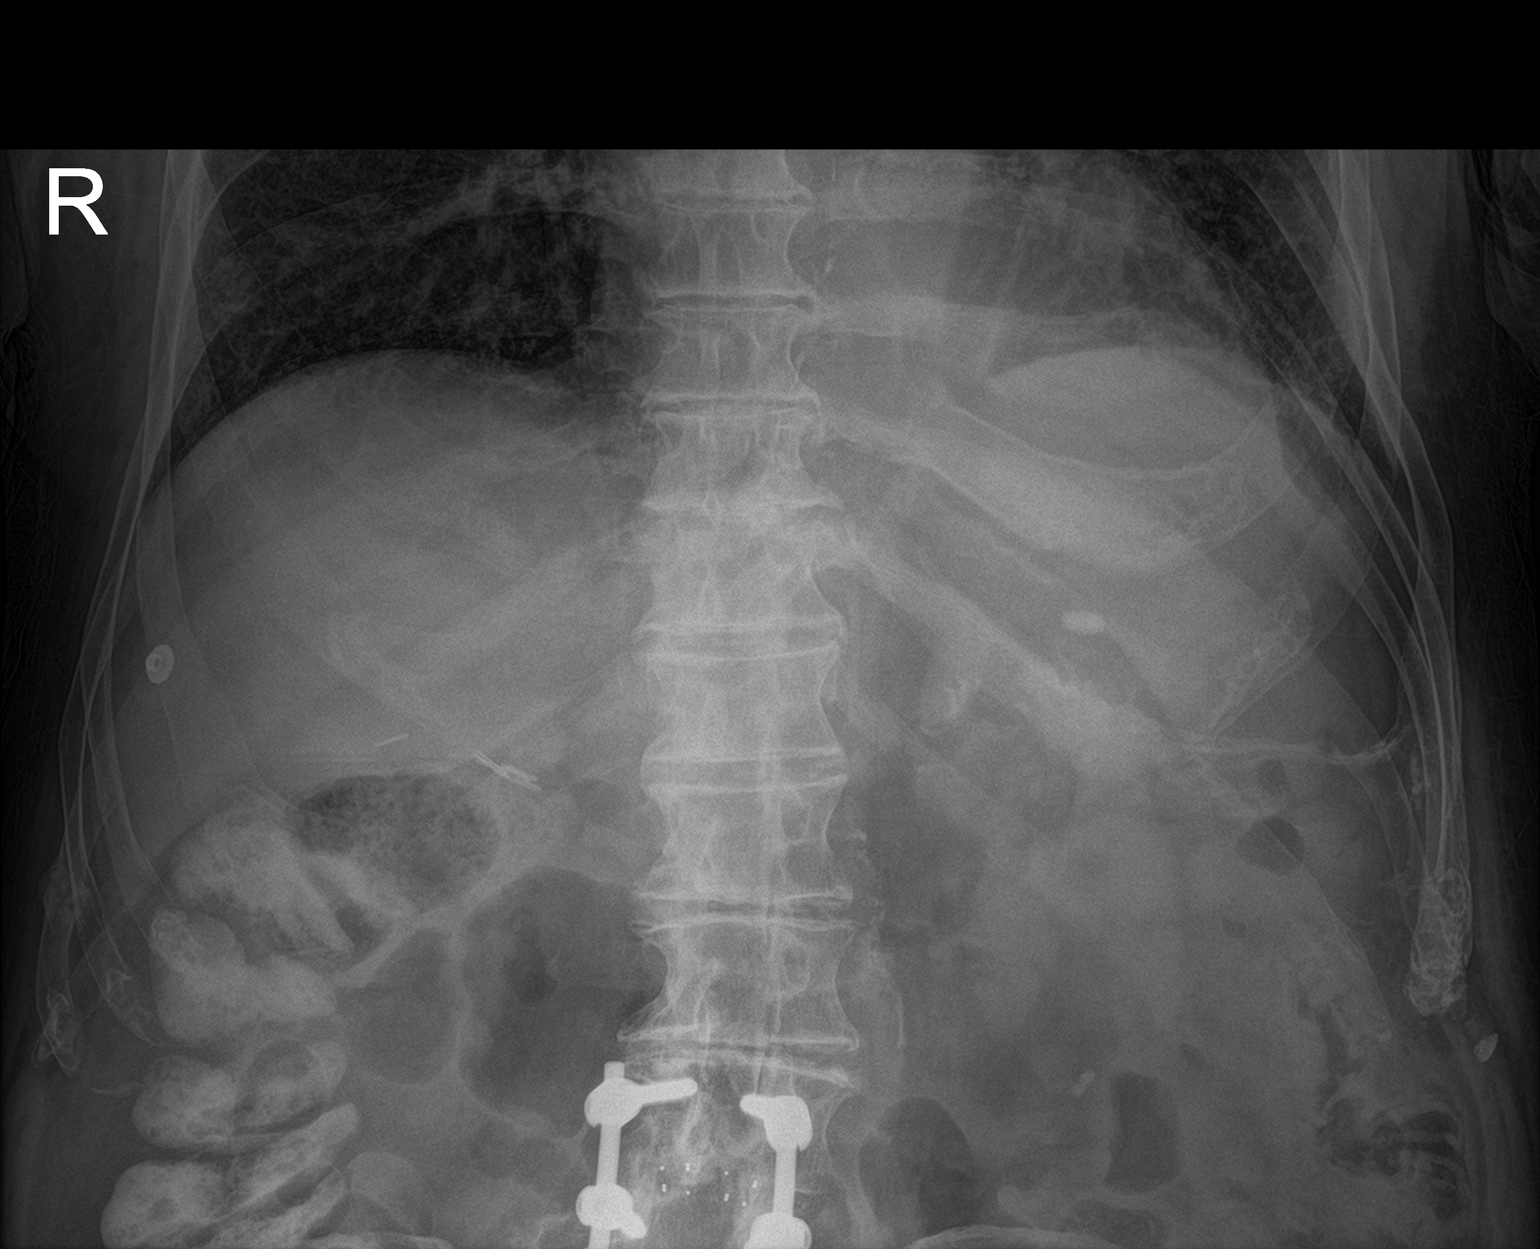

[3 of 3 positions shown; findings below may reference images not displayed]

FINDINGS: The nasogastric tube is no longer seen. Normal bowel gas pattern
without the previously demonstrated gastric distension. Fluid oral
contrast in the colon and distal small bowel. Lumbar and lower
thoracic spine degenerative changes and previously described lower
lumbar spine postoperative changes. Cholecystectomy clips.
IMPRESSION: No acute abnormality.  Resolved gastric ileus.

## 2021-09-27 DIAGNOSIS — S065X9A Traumatic subdural hemorrhage with loss of consciousness of unspecified duration, initial encounter: Secondary | ICD-10-CM | POA: Diagnosis not present

## 2021-09-27 NOTE — Assessment & Plan Note (Signed)
Please continue to follow with radiation oncology and Dr. Lisbeth Renshaw to complete your radiation treatment. Please call our office if you have any progressive shortness of breath, changes in cough, persistent blood in your mucus so that we can evaluate. Follow with Dr Lamonte Sakai in 6 months or sooner if you have any problems

## 2021-10-03 ENCOUNTER — Encounter: Payer: Self-pay | Admitting: Physical Medicine & Rehabilitation

## 2021-10-03 ENCOUNTER — Encounter: Payer: Medicare Other | Attending: Registered Nurse | Admitting: Physical Medicine & Rehabilitation

## 2021-10-03 ENCOUNTER — Other Ambulatory Visit: Payer: Self-pay

## 2021-10-03 VITALS — BP 125/74 | HR 79 | Temp 98.0°F | Ht 72.0 in | Wt 192.6 lb

## 2021-10-03 DIAGNOSIS — S065X3S Traumatic subdural hemorrhage with loss of consciousness of 1 hour to 5 hours 59 minutes, sequela: Secondary | ICD-10-CM | POA: Insufficient documentation

## 2021-10-03 DIAGNOSIS — S065XAA Traumatic subdural hemorrhage with loss of consciousness status unknown, initial encounter: Secondary | ICD-10-CM | POA: Insufficient documentation
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
## 2021-10-03 NOTE — Patient Instructions (Signed)
PLEASE FEEL FREE TO CALL OUR OFFICE WITH ANY PROBLEMS OR QUESTIONS ([phone])      

## 2021-10-05 ENCOUNTER — Telehealth: Payer: Self-pay | Admitting: Emergency Medicine

## 2021-10-05 NOTE — Telephone Encounter (Signed)
RB please advise if pt will need to have another scan done.  Thanks

## 2021-10-08 ENCOUNTER — Telehealth: Payer: Self-pay | Admitting: Radiation Oncology

## 2021-10-08 ENCOUNTER — Telehealth: Payer: Self-pay | Admitting: *Deleted

## 2021-10-08 NOTE — Telephone Encounter (Addendum)
  Radiation Oncology         [phone] ________________________________  Name: MAKARIOS MADLOCK MRN: [id number]  Date of Service: 10/08/2021  DOB: [birth year]  Post Treatment Telephone Note  Diagnosis:   Stage IIB, cT3N0M0, NSCLC, squamous cell carcinoma of the LLL.  Interval Since Last Radiation:  8 weeks   08/06/2021 through 08/17/2021 Ultrahypofractionated Radiotherapy, formerly called SBRT Style Treatment Site Technique Total Dose (Gy) Dose per Fx (Gy) Completed Fx Beam Energies  Lung, Left: Lung_Lt IMRT 60/60 6 10/10 6XFFF    Narrative:  The patient was contacted today for routine follow-up. During treatment he did very well with radiotherapy and did not have significant desquamation. He reports he had some sinus drainage and a productive cough but no fever or purulent mucous or shortness of breath.  Impression/Plan: 1. Stage IIB, cT3N0M0, NSCLC, squamous cell carcinoma of the LLL. The patient has been doing well since completion of radiotherapy. We discussed that we would be happy to continue to follow him as needed, but he will also continue to follow up with Dr. Julien Nordmann in medical oncology. I encouraged him to follow up with Dr. Lamonte Sakai if his cough continues. If it progresses in the next 4 weeks with shortness of breath or fever he will call us back.     Carola Rhine, PAC

## 2021-10-08 NOTE — Telephone Encounter (Signed)
Spoke with the patient who had concerns regarding his CT scan and completion of radiation.  He reports he has not heard when his next CT scan would be and he has some questions about his radiation.  He was informed that his CT scan has been ordered and it shows it is not due until January 2023.  He states he thought he would have a scan sooner, closer to radiation completion.  He states he finished his radiation 7 weeks ago.  I let him know that I would reach out to Dr. Worthy Flank office since they ordered the scan to clarify this with him.  I let him know I would have our PA to give him a call regarding radiation.  He verbalized understanding.  Gloriajean Dell. Leonie Green, BSN

## 2021-10-09 NOTE — Telephone Encounter (Signed)
Dr Lisbeth Renshaw with radiation oncology will plan a repeat Ct chest with him to compare with priors. Usually it is a few months after treatment is completed.

## 2021-10-10 NOTE — Telephone Encounter (Signed)
Called patient but he did not answer. Left message for him to call back.  

## 2021-10-15 DIAGNOSIS — E7800 Pure hypercholesterolemia, unspecified: Secondary | ICD-10-CM | POA: Diagnosis not present

## 2021-10-15 DIAGNOSIS — K219 Gastro-esophageal reflux disease without esophagitis: Secondary | ICD-10-CM | POA: Diagnosis not present

## 2021-10-15 DIAGNOSIS — E1140 Type 2 diabetes mellitus with diabetic neuropathy, unspecified: Secondary | ICD-10-CM | POA: Diagnosis not present

## 2021-10-15 DIAGNOSIS — I5042 Chronic combined systolic (congestive) and diastolic (congestive) heart failure: Secondary | ICD-10-CM | POA: Diagnosis not present

## 2021-10-15 DIAGNOSIS — I480 Paroxysmal atrial fibrillation: Secondary | ICD-10-CM | POA: Diagnosis not present

## 2021-10-15 DIAGNOSIS — I10 Essential (primary) hypertension: Secondary | ICD-10-CM | POA: Diagnosis not present

## 2021-10-15 DIAGNOSIS — E1165 Type 2 diabetes mellitus with hyperglycemia: Secondary | ICD-10-CM | POA: Diagnosis not present

## 2021-10-16 NOTE — Telephone Encounter (Signed)
Called and spoke with pt letting him know the info per RB. Pt said that he had OV with oncology and they went ahead and scheduled him for CT 1/23. Nothing further needed.

## 2021-10-28 DIAGNOSIS — S065X9A Traumatic subdural hemorrhage with loss of consciousness of unspecified duration, initial encounter: Secondary | ICD-10-CM | POA: Diagnosis not present

## 2021-11-12 DIAGNOSIS — L97522 Non-pressure chronic ulcer of other part of left foot with fat layer exposed: Secondary | ICD-10-CM | POA: Diagnosis not present

## 2021-11-12 DIAGNOSIS — M2042 Other hammer toe(s) (acquired), left foot: Secondary | ICD-10-CM | POA: Diagnosis not present

## 2021-11-19 ENCOUNTER — Inpatient Hospital Stay: Payer: Medicare Other | Attending: Internal Medicine

## 2021-11-19 ENCOUNTER — Other Ambulatory Visit: Payer: Self-pay

## 2021-11-19 ENCOUNTER — Encounter (HOSPITAL_COMMUNITY): Payer: Self-pay

## 2021-11-19 ENCOUNTER — Ambulatory Visit (HOSPITAL_COMMUNITY)
Admission: RE | Admit: 2021-11-19 | Discharge: 2021-11-19 | Disposition: A | Discharge status: Home / Self Care | Payer: Medicare Other | Source: Ambulatory Visit | Attending: Internal Medicine | Admitting: Internal Medicine

## 2021-11-19 DIAGNOSIS — J439 Emphysema, unspecified: Secondary | ICD-10-CM | POA: Diagnosis not present

## 2021-11-19 DIAGNOSIS — I700 Atherosclerosis of aorta: Secondary | ICD-10-CM | POA: Diagnosis not present

## 2021-11-19 DIAGNOSIS — C3490 Malignant neoplasm of unspecified part of unspecified bronchus or lung: Secondary | ICD-10-CM | POA: Diagnosis not present

## 2021-11-19 DIAGNOSIS — R911 Solitary pulmonary nodule: Secondary | ICD-10-CM | POA: Diagnosis not present

## 2021-11-19 LAB — CBC WITH DIFFERENTIAL (CANCER CENTER ONLY)
Abs Immature Granulocytes: 0.07 10*3/uL (ref 0.00–0.07)
Basophils Absolute: 0 10*3/uL (ref 0.0–0.1)
Basophils Relative: 1 %
Eosinophils Absolute: 0 10*3/uL (ref 0.0–0.5)
Eosinophils Relative: 1 %
HCT: 37.8 % — ABNORMAL LOW (ref 39.0–52.0)
Hemoglobin: 12.7 g/dL — ABNORMAL LOW (ref 13.0–17.0)
Immature Granulocytes: 2 %
Lymphocytes Relative: 19 %
Lymphs Abs: 0.8 10*3/uL (ref 0.7–4.0)
MCH: 31.5 pg (ref 26.0–34.0)
MCHC: 33.6 g/dL (ref 30.0–36.0)
MCV: 93.8 fL (ref 80.0–100.0)
Monocytes Absolute: 0.6 10*3/uL (ref 0.1–1.0)
Monocytes Relative: 16 %
Neutro Abs: 2.5 10*3/uL (ref 1.7–7.7)
Neutrophils Relative %: 61 %
Platelet Count: 187 10*3/uL (ref 150–400)
RBC: 4.03 MIL/uL — ABNORMAL LOW (ref 4.22–5.81)
RDW: 15.1 % (ref 11.5–15.5)
WBC Count: 4.1 10*3/uL (ref 4.0–10.5)
nRBC: 0 % (ref 0.0–0.2)

## 2021-11-19 LAB — CMP (CANCER CENTER ONLY)
ALT: 16 U/L (ref 0–44)
AST: 12 U/L — ABNORMAL LOW (ref 15–41)
Albumin: 4 g/dL (ref 3.5–5.0)
Alkaline Phosphatase: 85 U/L (ref 38–126)
Anion gap: 8 (ref 5–15)
BUN: 44 mg/dL — ABNORMAL HIGH (ref 8–23)
CO2: 31 mmol/L (ref 22–32)
Calcium: 10.5 mg/dL — ABNORMAL HIGH (ref 8.9–10.3)
Chloride: 101 mmol/L (ref 98–111)
Creatinine: 1.43 mg/dL — ABNORMAL HIGH (ref 0.61–1.24)
GFR, Estimated: 49 mL/min — ABNORMAL LOW (ref >60)
Glucose, Bld: 254 mg/dL — ABNORMAL HIGH (ref 70–99)
Potassium: 4.6 mmol/L (ref 3.5–5.1)
Sodium: 140 mmol/L (ref 135–145)
Total Bilirubin: 0.4 mg/dL (ref 0.3–1.2)
Total Protein: 7.5 g/dL (ref 6.5–8.1)

## 2021-11-19 MED ORDER — SODIUM CHLORIDE (PF) 0.9 % IJ SOLN
INTRAMUSCULAR | Status: AC
  Filled 2021-11-19: qty 50

## 2021-11-19 MED ORDER — IOHEXOL 300 MG/ML  SOLN
75.0000 mL | Freq: Once | INTRAMUSCULAR | Status: AC | PRN
  Administered 2021-11-19: 75 mL via INTRAVENOUS

## 2021-11-20 ENCOUNTER — Encounter: Payer: Self-pay | Admitting: Physical Therapy

## 2021-11-21 ENCOUNTER — Ambulatory Visit: Payer: Medicare Other | Admitting: Internal Medicine

## 2021-11-22 ENCOUNTER — Encounter: Payer: Self-pay | Admitting: Internal Medicine

## 2021-11-26 DIAGNOSIS — L97521 Non-pressure chronic ulcer of other part of left foot limited to breakdown of skin: Secondary | ICD-10-CM | POA: Diagnosis not present

## 2021-11-28 ENCOUNTER — Inpatient Hospital Stay: Payer: Medicare Other | Attending: Internal Medicine | Admitting: Internal Medicine

## 2021-11-28 ENCOUNTER — Other Ambulatory Visit: Payer: Self-pay

## 2021-11-28 VITALS — BP 148/81 | HR 97 | Temp 97.6°F | Resp 19 | Ht 72.0 in | Wt 204.4 lb

## 2021-11-28 DIAGNOSIS — G629 Polyneuropathy, unspecified: Secondary | ICD-10-CM | POA: Insufficient documentation

## 2021-11-28 DIAGNOSIS — R5383 Other fatigue: Secondary | ICD-10-CM | POA: Insufficient documentation

## 2021-11-28 DIAGNOSIS — S065X9A Traumatic subdural hemorrhage with loss of consciousness of unspecified duration, initial encounter: Secondary | ICD-10-CM | POA: Diagnosis not present

## 2021-11-28 DIAGNOSIS — Z923 Personal history of irradiation: Secondary | ICD-10-CM | POA: Insufficient documentation

## 2021-11-28 DIAGNOSIS — C3490 Malignant neoplasm of unspecified part of unspecified bronchus or lung: Secondary | ICD-10-CM | POA: Diagnosis not present

## 2021-11-28 DIAGNOSIS — C3432 Malignant neoplasm of lower lobe, left bronchus or lung: Secondary | ICD-10-CM | POA: Diagnosis not present

## 2021-11-28 DIAGNOSIS — Z79899 Other long term (current) drug therapy: Secondary | ICD-10-CM | POA: Insufficient documentation

## 2021-11-28 NOTE — Progress Notes (Signed)
[place] [phone]   [phone]  OFFICE PROGRESS NOTE  [name], MD [address] [place] [address]  DIAGNOSIS: stage IIb (T3, N0, M0) non-small cell lung cancer, squamous cell carcinoma presented with large left lower lobe lung mass in addition to suspicious left hilar lymphadenopathy (but is not completely confirmed)  diagnosed in September 2022.  PRIOR THERAPY: Status post SBRT to the large left lower lobe lung mass under the care of Dr. Lisbeth Renshaw completed August 17, 2021  CURRENT THERAPY: Observation  INTERVAL HISTORY: [name] 81 y.o. male returns to the clinic today for follow-up visit accompanied by his wife.  The patient is feeling fine today with no concerning complaints except for the persistent cough productive of tan-colored sputum as well as wheezing.  He denied having any chest pain, shortness of breath or hemoptysis.  He denied having any fever or chills.  He has no nausea, vomiting, diarrhea or constipation.  He has no headache or visual changes.  He tolerated his previous SBRT to the left lower lobe lung mass fairly well.  The patient has been on observation since that time.  He had repeat CT scan of the chest performed recently and is here for evaluation and discussion of his discuss results.  MEDICAL HISTORY: Past Medical History:  Diagnosis Date   AAA (abdominal aortic aneurysm)    a. s/p repair 2009.   Acid reflux    takes Prilosec and Protonix daily   Arthritis    BacK   CAD in native artery    a. a. prior inf MI 1993. b. PCI to LAD 05/1997. c. recurrent inferolateral MI complicated by V. fib arrest in 04/1998, prior PCI to OM1. d. 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   Cancer (HCC)    basal and squamous cell   Chronic combined systolic and diastolic CHF (congestive heart failure) (HCC)    Complication of anesthesia    had an ileus after a couple of surgeries   COVID    Depression    over 30  years ago   Dry skin    Encephalitis    Foot drop, bilateral    Hypercholesteremia    Hypertension    Ileus (HCC)    After AAA   Incisional hernia    "small, from AAA"   Ischemic cardiomyopathy    MI (myocardial infarction) ([place])    Neuromuscular disorder ([place])    neuropathy in feet   Neuropathy    in feet & legs   OSA on CPAP    uses CPAP--sleep study done at least 78yrs ago   PAF (paroxysmal atrial fibrillation) (HCC)    Pain    back pain chronic- seen at pain clinic   Plantar fasciitis    bilatetral   Pneumonia    Thrombocytopenia (HCC)    Type II diabetes mellitus (HCC)     ALLERGIES:  is allergic to cymbalta [duloxetine hcl], neurontin [gabapentin], and keppra [levetiracetam].  MEDICATIONS:  Current Outpatient Medications  Medication Sig Dispense Refill   ACCU-CHEK SMARTVIEW test strip CHECK 1 TO 2 TIMES A DAY     acetaminophen (TYLENOL) 325 MG tablet Take 650 mg by mouth every 6 (six) hours as needed (pain/fever).     amiodarone (PACERONE) 200 MG tablet Take 1 tablet (200 mg total) by mouth at bedtime. 90 tablet 3   amitriptyline (ELAVIL) 10 MG tablet Take 10 mg by mouth at bedtime.  amitriptyline (ELAVIL) 50 MG tablet Take 50 mg by mouth at bedtime.     atorvastatin (LIPITOR) 10 MG tablet Take 10 mg by mouth in the morning.     Cholecalciferol (VITAMIN D3) 50 MCG (2000 UT) TABS Take 2,000 Units by mouth daily.     Coenzyme Q10 (COQ10) 200 MG CAPS Take 200 mg by mouth in the morning.     Cyanocobalamin (B-12) 5000 MCG CAPS Take 5,000 mcg by mouth daily. 30 capsule 0   famotidine (PEPCID) 20 MG tablet Take 20 mg by mouth daily as needed for heartburn or indigestion.     finasteride (PROPECIA) 1 MG tablet Take 1 mg by mouth daily.     furosemide (LASIX) 40 MG tablet Take 1 tablet (40 mg total) by mouth daily as needed (fluid retention/edema).     glimepiride (AMARYL) 2 MG tablet Take 3 tablets (6 mg total) by mouth daily with breakfast. 90 tablet 0   JARDIANCE 25  MG TABS tablet Take 25 mg by mouth every evening.     lactose free nutrition (BOOST) LIQD Take 237 mLs by mouth in the morning.     Lidocaine HCl 4 % CREA Apply 1 application topically 4 (four) times daily as needed (pain.).     metFORMIN (GLUCOPHAGE-XR) 500 MG 24 hr tablet Take 500-1,000 mg by mouth See admin instructions. Take 2 tablets (1000 mg) by mouth in the morning & take 1 tablet (500 mg) by mouth in the evening.     metoprolol tartrate (LOPRESSOR) 50 MG tablet Take 50 mg by mouth 2 (two) times daily.     mirtazapine (REMERON) 15 MG tablet Take 1 tablet (15 mg total) by mouth at bedtime. 30 tablet 0   Multiple Vitamin (MULTIVITAMIN WITH MINERALS) TABS tablet Take 1 tablet by mouth daily.     ondansetron (ZOFRAN ODT) 4 MG disintegrating tablet Take 1 tablet (4 mg total) by mouth every 8 (eight) hours as needed for nausea or vomiting. 20 tablet 0   ondansetron (ZOFRAN) 4 MG tablet Take 4 mg by mouth every 6 (six) hours as needed.     oxyCODONE-acetaminophen (PERCOCET) 7.5-325 MG tablet Take 1-2 tablets by mouth at bedtime as needed (pain.).     OZEMPIC, 0.25 OR 0.5 MG/DOSE, 2 MG/1.5ML SOPN Inject 0.25 mg into the skin every Tuesday.     pentoxifylline (TRENTAL) 400 MG CR tablet Take 1 tablet (400 mg total) by mouth 3 (three) times daily with meals. OK to restart on 07/10/21 90 tablet 3   PFIZER-BIONT COVID-19 VAC-TRIS SUSP injection      pregabalin (LYRICA) 300 MG capsule Take 300 mg by mouth 2 (two) times daily.     rivaroxaban (XARELTO) 20 MG TABS tablet Take 1 tablet (20 mg total) by mouth every evening. OK to restart this medication on 07/10/21 30 tablet 0   senna-docusate (SENOKOT-S) 8.6-50 MG tablet Take 1 tablet by mouth daily as needed (constipation.).     No current facility-administered medications for this visit.    SURGICAL HISTORY:  Past Surgical History:  Procedure Laterality Date   ABDOMINAL AORTIC ANEURYSM REPAIR     BACK SURGERY  2012-2013 X 3   Miminal Invasive x 3in  [place].   BRONCHIAL BIOPSY  07/09/2021   Procedure: BRONCHIAL BIOPSIES;  Surgeon: Collene Gobble, MD;  Location: [facility] ENDOSCOPY;  Service: Pulmonary;;   BRONCHIAL BRUSHINGS  07/09/2021   Procedure: BRONCHIAL BRUSHINGS;  Surgeon: Collene Gobble, MD;  Location[facility]COPY;  Service: Pulmonary;;  BRONCHIAL NEEDLE ASPIRATION BIOPSY  07/09/2021   Procedure: BRONCHIAL NEEDLE ASPIRATION BIOPSIES;  Surgeon: Collene Gobble, MD;  Location: [facility];  Service: Pulmonary;;   CARDIAC CATHETERIZATION  2009/2012   CARDIOVERSION N/A 04/12/2015   Procedure: CARDIOVERSION;  Surgeon: Sueanne Margarita, MD;  Location: [facility];  Service: Cardiovascular;  Laterality: N/A;   CARDIOVERSION N/A 12/26/2016   Procedure: CARDIOVERSION;  Surgeon: Sueanne Margarita, MD;  Location: [facility];  Service: Cardiovascular;  Laterality: N/A;   COLONOSCOPY     CORONARY ANGIOPLASTY WITH [place]; [place]  07/09/2021   Procedure: FIDUCIAL MARKER PLACEMENT;  Surgeon: Collene Gobble, MD;  Location: [facility] ENDOSCOPY;  Service: Pulmonary;;   LAPAROSCOPIC CHOLECYSTECTOMY     LUMBAR LAMINECTOMY/DECOMPRESSION MICRODISCECTOMY Right 01/16/2016   Procedure: Right Lumbar five-Sacral one Laminectomy;  Surgeon: Kristeen Miss, MD;  Location: [facility] NEURO ORS;  Service: Neurosurgery;  Laterality: Right;  Right L5-S1 Laminectomy   VIDEO BRONCHOSCOPY WITH ENDOBRONCHIAL NAVIGATION N/A 07/09/2021   Procedure: ROBOTIC VIDEO BRONCHOSCOPY WITH ENDOBRONCHIAL NAVIGATION;  Surgeon: Collene Gobble, MD;  Location: [facility] ENDOSCOPY;  Service: Pulmonary;  Laterality: N/A;   VIDEO BRONCHOSCOPY WITH RADIAL ENDOBRONCHIAL ULTRASOUND  07/09/2021   Procedure: VIDEO BRONCHOSCOPY WITH RADIAL ENDOBRONCHIAL ULTRASOUND;  Surgeon: Collene Gobble, MD;  Location: [facility] ENDOSCOPY;  Service: Pulmonary;;    REVIEW OF SYSTEMS:  Constitutional: positive for fatigue Eyes: negative Ears, nose, mouth, throat, and face: negative Respiratory: positive  for cough, dyspnea on exertion, and sputum Cardiovascular: negative Gastrointestinal: negative Genitourinary:negative Integument/breast: negative Hematologic/lymphatic: negative Musculoskeletal:negative Neurological: negative Behavioral/Psych: negative Endocrine: negative Allergic/Immunologic: negative   PHYSICAL EXAMINATION: General appearance: alert, cooperative, fatigued, and no distress Head: Normocephalic, without obvious abnormality, atraumatic Neck: no adenopathy, no JVD, supple, symmetrical, trachea midline, and thyroid not enlarged, symmetric, no tenderness/mass/nodules Lymph nodes: Cervical, supraclavicular, and axillary nodes normal. Resp: wheezes LLL Back: symmetric, no curvature. ROM normal. No CVA tenderness. Cardio: regular rate and rhythm, S1, S2 normal, no murmur, click, rub or gallop GI: soft, non-tender; bowel sounds normal; no masses,  no organomegaly Extremities: extremities normal, atraumatic, no cyanosis or edema Neurologic: Alert and oriented X 3, normal strength and tone. Normal symmetric reflexes. Normal coordination and gait  ECOG PERFORMANCE STATUS: 1 - Symptomatic but completely ambulatory  Blood pressure (!) 148/81, pulse 97, temperature 97.6 F (36.4 C), temperature source Tympanic, resp. rate 19, height 6' (1.829 m), weight 204 lb 6.4 oz (92.7 kg), SpO2 99 %.  LABORATORY DATA: Lab Results  Component Value Date   WBC 4.1 11/19/2021   HGB 12.7 (L) 11/19/2021   HCT 37.8 (L) 11/19/2021   MCV 93.8 11/19/2021   PLT 187 11/19/2021      Chemistry      Component Value Date/Time   NA 140 11/19/2021 0836   NA 132 (L) 12/31/2019 1625   K 4.6 11/19/2021 0836   CL 101 11/19/2021 0836   CO2 31 11/19/2021 0836   BUN 44 (H) 11/19/2021 0836   BUN 26 12/31/2019 1625   CREATININE 1.43 (H) 11/19/2021 0836   CREATININE 1.03 01/09/2016 1129      Component Value Date/Time   CALCIUM 10.5 (H) 11/19/2021 0836   ALKPHOS 85 11/19/2021 0836   AST 12 (L)  11/19/2021 0836   ALT 16 11/19/2021 0836   BILITOT 0.4 11/19/2021 0836       RADIOGRAPHIC STUDIES: CT Chest W Contrast  Result Date: 11/19/2021 CLINICAL DATA:  History of non-small cell lung cancer for staging/follow-up. Post  chemo and radiotherapy by report in this 81 year old male. EXAM: CT CHEST WITH CONTRAST TECHNIQUE: Multidetector CT imaging of the chest was performed during intravenous contrast administration. RADIATION DOSE REDUCTION: This exam was performed according to the departmental dose-optimization program which includes automated exposure control, adjustment of the mA and/or kV according to patient size and/or use of iterative reconstruction technique. CONTRAST:  21mL OMNIPAQUE IOHEXOL 300 MG/ML  SOLN COMPARISON:  Comparison is made with July 05, 2021. FINDINGS: Cardiovascular: Signs of prior myocardial infarction involving the lateral wall of the LEFT ventricle with sub endocardial fat deposition similar to prior imaging. Extensive 3 vessel coronary artery calcification. Mild cardiac enlargement without pericardial effusion. Calcified and noncalcified atheromatous plaque of the thoracic aorta without aneurysmal dilation. Central pulmonary vasculature is normal caliber. Unremarkable on venous phase assessment. Mediastinum/Nodes: Esophagus is grossly normal. No adenopathy in the chest. Lungs/Pleura: Patchy ground-glass is developed in the RIGHT middle lobe. Masslike area tethering the fissure in the LEFT chest has diminished in size measuring 2.6 x 2.0 cm greatest axial dimension (image 53/7) this previously measured 3.1 x 2.9 cm. Consolidative changes in the LEFT mid chest arise from the LEFT hilum and most indicative of post radiation changes extending from the hilum into the peripheral LEFT chest and LEFT upper lobe as well as superior segment of the LEFT lower lobe. Biapical scarring as before. New pulmonary nodule in the LEFT lower lobe with spiculated morphology (image 110/7) 15 x  10 mm. Mild pulmonary emphysema. Patent airways in the RIGHT chest with mild narrowing of airways at the LEFT hilum particularly to LEFT upper lobe and superior segment of LEFT lower lobe. No pleural effusion. Upper Abdomen: Incidental imaging of upper abdominal contents shows normal appearance of imaged portions of the adrenal glands and no acute findings. Musculoskeletal: No acute bone finding. No destructive bone process. Spinal degenerative changes. IMPRESSION: 1. New pulmonary nodule in the LEFT lower lobe with spiculated morphology, measuring 15 x 10 mm. Findings are concerning for metastatic disease. 2. Post radiation changes about the LEFT hilum extending into the peripheral LEFT upper and lower lobe as described with diminishing size of the pulmonary mass along the major fissure in the LEFT chest. 3. Patchy areas of ground-glass in the RIGHT middle lobe and to a lesser extent RIGHT lower lobe may reflect mild infectious or inflammatory process, attention on follow-up. 4. Coronary artery disease and aortic atherosclerosis with mild pulmonary emphysema. 5. Signs of prior myocardial infarction. Aortic Atherosclerosis (ICD10-I70.0) and Emphysema (ICD10-J43.9). Electronically Signed   By: Zetta Bills M.D.   On: 11/19/2021 22:49    ASSESSMENT AND PLAN: This is a very pleasant 81 years old white male diagnosed with a stage IIb (T3, N0, M0) non-small cell lung cancer, squamous cell carcinoma in September 2022 1 presented with large left lower lobe lung mass in addition to suspicious left hilar lymphadenopathy. The patient is status post SBRT to the large left lower lobe lung mass in 10 fractions. He tolerated his previous treatment fairly well. He is currently on observation. The patient had a repeat CT scan of the chest performed recently.  I personally and independently reviewed the scan images and discussed the results and showed the images to the patient and his wife. Unfortunately the scan showed new  pulmonary nodule in the left lower lobe with spiculated morphology suspicious for metastatic disease.  There was postradiation changes in the previously radiated left lower lobe lung mass area. I recommended for the patient to have a PET  scan for further evaluation of the new nodule and to rule out any disease recurrence. I will see him back for follow-up visit in 2 weeks for reevaluation and discussion of his treatment options based on the PET scan results. The patient was advised to call immediately if he has any other concerning symptoms in the interval. The patient voices understanding of current disease status and treatment options and is in agreement with the current care plan.  All questions were answered. The patient knows to call the clinic with any problems, questions or concerns. We can certainly see the patient much sooner if necessary.  The total time spent in the appointment was 35 minutes.  Disclaimer: This note was dictated with voice recognition software. Similar sounding words can inadvertently be transcribed and may not be corrected upon review.

## 2021-12-09 ENCOUNTER — Encounter: Payer: Self-pay | Admitting: Internal Medicine

## 2021-12-10 ENCOUNTER — Encounter (HOSPITAL_COMMUNITY)
Admission: RE | Admit: 2021-12-10 | Discharge: 2021-12-10 | Disposition: A | Discharge status: Home / Self Care | Payer: Medicare Other | Source: Ambulatory Visit | Attending: Internal Medicine | Admitting: Internal Medicine

## 2021-12-10 ENCOUNTER — Other Ambulatory Visit: Payer: Self-pay

## 2021-12-10 DIAGNOSIS — I2510 Atherosclerotic heart disease of native coronary artery without angina pectoris: Secondary | ICD-10-CM | POA: Diagnosis not present

## 2021-12-10 DIAGNOSIS — C3490 Malignant neoplasm of unspecified part of unspecified bronchus or lung: Secondary | ICD-10-CM | POA: Diagnosis not present

## 2021-12-10 DIAGNOSIS — J701 Chronic and other pulmonary manifestations due to radiation: Secondary | ICD-10-CM | POA: Diagnosis not present

## 2021-12-10 DIAGNOSIS — N200 Calculus of kidney: Secondary | ICD-10-CM | POA: Diagnosis not present

## 2021-12-10 DIAGNOSIS — M7751 Other enthesopathy of right foot: Secondary | ICD-10-CM | POA: Diagnosis not present

## 2021-12-10 DIAGNOSIS — M65871 Other synovitis and tenosynovitis, right ankle and foot: Secondary | ICD-10-CM | POA: Diagnosis not present

## 2021-12-10 LAB — GLUCOSE, CAPILLARY: Glucose-Capillary: 358 mg/dL — ABNORMAL HIGH (ref 70–99)

## 2021-12-10 MED ORDER — FLUDEOXYGLUCOSE F - 18 (FDG) INJECTION
10.0000 | Freq: Once | INTRAVENOUS | Status: AC | PRN
  Administered 2021-12-10: 10.58 via INTRAVENOUS

## 2021-12-13 ENCOUNTER — Inpatient Hospital Stay: Payer: Medicare Other | Admitting: Internal Medicine

## 2021-12-13 ENCOUNTER — Encounter: Payer: Self-pay | Admitting: *Deleted

## 2021-12-13 ENCOUNTER — Other Ambulatory Visit: Payer: Self-pay

## 2021-12-13 VITALS — BP 132/63 | HR 76 | Temp 97.1°F | Resp 20 | Ht 72.0 in | Wt 202.1 lb

## 2021-12-13 DIAGNOSIS — Z923 Personal history of irradiation: Secondary | ICD-10-CM | POA: Diagnosis not present

## 2021-12-13 DIAGNOSIS — C3492 Malignant neoplasm of unspecified part of left bronchus or lung: Secondary | ICD-10-CM | POA: Diagnosis not present

## 2021-12-13 DIAGNOSIS — C3432 Malignant neoplasm of lower lobe, left bronchus or lung: Secondary | ICD-10-CM | POA: Diagnosis not present

## 2021-12-13 DIAGNOSIS — Z79899 Other long term (current) drug therapy: Secondary | ICD-10-CM | POA: Diagnosis not present

## 2021-12-13 DIAGNOSIS — G629 Polyneuropathy, unspecified: Secondary | ICD-10-CM | POA: Diagnosis not present

## 2021-12-13 DIAGNOSIS — R5383 Other fatigue: Secondary | ICD-10-CM | POA: Diagnosis not present

## 2021-12-13 DIAGNOSIS — R918 Other nonspecific abnormal finding of lung field: Secondary | ICD-10-CM

## 2021-12-13 NOTE — Progress Notes (Signed)
Oncology Nurse Navigator Documentation  Oncology Nurse Navigator Flowsheets 12/13/2021 07/24/2021  Abnormal Finding Date - 02/16/2021  Confirmed Diagnosis Date - 07/09/2021  Diagnosis Status - Confirmed Diagnosis Complete  Planned Course of Treatment - Radiation  Phase of Treatment - Radiation  Navigator Follow Up Date: - 07/25/2021  Navigator Follow Up Reason: - Appointment Review  Navigator Location [facility]  Referral Date to RadOnc/MedOnc - 07/13/2021  Navigator Encounter Type Clinic/MDC Clinic/MDC;Initial MedOnc  Patient Visit Type MedOnc;Follow-up MedOnc;Initial  Treatment Phase Abnormal Scans Pre-Tx/Tx Discussion  Barriers/Navigation Needs Coordination of Care/per Dr. Julien Nordmann, re-referred to Dr. Lamonte Sakai for bx of lung nodule.  Coordination of Care;Education  Education - Other;Newly Diagnosed Cancer Education  Interventions Coordination of Care Coordination of Care;Education;Psycho-Social Support  Acuity Level 2-Minimal Needs (1-2 Barriers Identified) Level 2-Minimal Needs (1-2 Barriers Identified)  Coordination of Care Other Other  Education Method - Written;Verbal  Support Groups/Services - Other  Time Spent with Patient 30 45

## 2021-12-13 NOTE — Progress Notes (Signed)
[place] [phone]   [phone]  OFFICE PROGRESS NOTE  [name], MD [address] [place] [address]  DIAGNOSIS: stage IIb (T3, N0, M0) non-small cell lung cancer, squamous cell carcinoma presented with large left lower lobe lung mass in addition to suspicious left hilar lymphadenopathy (but is not completely confirmed)  diagnosed in September 2022.  PRIOR THERAPY: Status post SBRT to the large left lower lobe lung mass under the care of Dr. Lisbeth Renshaw completed August 17, 2021  CURRENT THERAPY: Observation  INTERVAL HISTORY: [name] 81 y.o. male returns to the clinic today for follow-up visit accompanied by his wife.  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  He is currently on Lyrica as it is not helping.  He is followed by his primary care physician for this issue.  He denied having any chest pain but has mild shortness of breath with exertion and cough with no hemoptysis.  He has no nausea, vomiting, diarrhea or constipation.  He has no headache or visual changes.  He was found on previous CT scan of the chest to have suspicious nodule and the left lung.  The patient had a PET scan performed recently and he is here for evaluation and discussion of his scan results and treatment options.   MEDICAL HISTORY: Past Medical History:  Diagnosis Date   AAA (abdominal aortic aneurysm)    a. s/p repair 2009.   Acid reflux    takes Prilosec and Protonix daily   Arthritis    BacK   CAD in native artery    a. a. prior inf MI 1993. b. PCI to LAD 05/1997. c. recurrent inferolateral MI complicated by V. fib arrest in 04/1998, prior PCI to OM1. d. 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   Cancer (HCC)    basal and squamous cell   Chronic combined systolic and diastolic CHF (congestive heart failure) (HCC)    Complication of anesthesia    had an  ileus after a couple of surgeries   COVID    Depression    over 30 years ago   Dry skin    Encephalitis    Foot drop, bilateral    Hypercholesteremia    Hypertension    Ileus (HCC)    After AAA   Incisional hernia    "small, from AAA"   Ischemic cardiomyopathy    MI (myocardial infarction) ([place])    Neuromuscular disorder ([place])    neuropathy in feet   Neuropathy    in feet & legs   OSA on CPAP    uses CPAP--sleep study done at least 67yrs ago   PAF (paroxysmal atrial fibrillation) (HCC)    Pain    back pain chronic- seen at pain clinic   Plantar fasciitis    bilatetral   Pneumonia    Thrombocytopenia (HCC)    Type II diabetes mellitus (HCC)     ALLERGIES:  is allergic to cymbalta [duloxetine hcl], neurontin [gabapentin], and keppra [levetiracetam].  MEDICATIONS:  Current Outpatient Medications  Medication Sig Dispense Refill   ACCU-CHEK SMARTVIEW test strip CHECK 1 TO 2 TIMES A DAY     acetaminophen (TYLENOL) 325 MG tablet Take 650 mg by mouth every 6 (six) hours as needed (pain/fever).     amiodarone (PACERONE) 200 MG tablet Take 1 tablet (200 mg total) by mouth at  bedtime. 90 tablet 3   amitriptyline (ELAVIL) 10 MG tablet Take 10 mg by mouth at bedtime.     amitriptyline (ELAVIL) 50 MG tablet Take 50 mg by mouth at bedtime.     atorvastatin (LIPITOR) 10 MG tablet Take 10 mg by mouth in the morning.     Cholecalciferol (VITAMIN D3) 50 MCG (2000 UT) TABS Take 2,000 Units by mouth daily.     Coenzyme Q10 (COQ10) 200 MG CAPS Take 200 mg by mouth in the morning.     Cyanocobalamin (B-12) 5000 MCG CAPS Take 5,000 mcg by mouth daily. 30 capsule 0   famotidine (PEPCID) 20 MG tablet Take 20 mg by mouth daily as needed for heartburn or indigestion.     finasteride (PROPECIA) 1 MG tablet Take 1 mg by mouth daily.     furosemide (LASIX) 40 MG tablet Take 1 tablet (40 mg total) by mouth daily as needed (fluid retention/edema).     glimepiride (AMARYL) 2 MG tablet Take 3 tablets (6  mg total) by mouth daily with breakfast. 90 tablet 0   JARDIANCE 25 MG TABS tablet Take 25 mg by mouth every evening.     lactose free nutrition (BOOST) LIQD Take 237 mLs by mouth in the morning.     Lidocaine HCl 4 % CREA Apply 1 application topically 4 (four) times daily as needed (pain.).     metFORMIN (GLUCOPHAGE-XR) 500 MG 24 hr tablet Take 500-1,000 mg by mouth See admin instructions. Take 2 tablets (1000 mg) by mouth in the morning & take 1 tablet (500 mg) by mouth in the evening.     metoprolol tartrate (LOPRESSOR) 50 MG tablet Take 50 mg by mouth 2 (two) times daily.     mirtazapine (REMERON) 15 MG tablet Take 1 tablet (15 mg total) by mouth at bedtime. 30 tablet 0   Multiple Vitamin (MULTIVITAMIN WITH MINERALS) TABS tablet Take 1 tablet by mouth daily.     ondansetron (ZOFRAN ODT) 4 MG disintegrating tablet Take 1 tablet (4 mg total) by mouth every 8 (eight) hours as needed for nausea or vomiting. 20 tablet 0   ondansetron (ZOFRAN) 4 MG tablet Take 4 mg by mouth every 6 (six) hours as needed.     oxyCODONE-acetaminophen (PERCOCET) 7.5-325 MG tablet Take 1-2 tablets by mouth at bedtime as needed (pain.).     OZEMPIC, 0.25 OR 0.5 MG/DOSE, 2 MG/1.5ML SOPN Inject 0.25 mg into the skin every Tuesday.     pentoxifylline (TRENTAL) 400 MG CR tablet Take 1 tablet (400 mg total) by mouth 3 (three) times daily with meals. OK to restart on 07/10/21 90 tablet 3   PFIZER-BIONT COVID-19 VAC-TRIS SUSP injection      pregabalin (LYRICA) 300 MG capsule Take 300 mg by mouth 2 (two) times daily.     rivaroxaban (XARELTO) 20 MG TABS tablet Take 1 tablet (20 mg total) by mouth every evening. OK to restart this medication on 07/10/21 30 tablet 0   senna-docusate (SENOKOT-S) 8.6-50 MG tablet Take 1 tablet by mouth daily as needed (constipation.).     No current facility-administered medications for this visit.    SURGICAL HISTORY:  Past Surgical History:  Procedure Laterality Date   ABDOMINAL AORTIC ANEURYSM  REPAIR     BACK SURGERY  2012-2013 X 3   Miminal Invasive x 3in [place].   BRONCHIAL BIOPSY  07/09/2021   Procedure: BRONCHIAL BIOPSIES;  Surgeon: Collene Gobble, MD;  Location: [facility] ENDOSCOPY;  Service: Pulmonary;;   BRONCHIAL  BRUSHINGS  07/09/2021   Procedure: BRONCHIAL BRUSHINGS;  Surgeon: Collene Gobble, MD;  Location: [facility] ENDOSCOPY;  Service: Pulmonary;;   BRONCHIAL NEEDLE ASPIRATION BIOPSY  07/09/2021   Procedure: BRONCHIAL NEEDLE ASPIRATION BIOPSIES;  Surgeon: Collene Gobble, MD;  Location: [place];  Service: Pulmonary;;   CARDIAC CATHETERIZATION  2009/2012   CARDIOVERSION N/A 04/12/2015   Procedure: CARDIOVERSION;  Surgeon: Sueanne Margarita, MD;  Location: [place];  Service: Cardiovascular;  Laterality: N/A;   CARDIOVERSION N/A 12/26/2016   Procedure: CARDIOVERSION;  Surgeon: Sueanne Margarita, MD;  Location: [place];  Service: Cardiovascular;  Laterality: N/A;   COLONOSCOPY     CORONARY ANGIOPLASTY WITH [place]; [place]  07/09/2021   Procedure: FIDUCIAL MARKER PLACEMENT;  Surgeon: Collene Gobble, MD;  Location: [facility] ENDOSCOPY;  Service: Pulmonary;;   LAPAROSCOPIC CHOLECYSTECTOMY     LUMBAR LAMINECTOMY/DECOMPRESSION MICRODISCECTOMY Right 01/16/2016   Procedure: Right Lumbar five-Sacral one Laminectomy;  Surgeon: Kristeen Miss, MD;  Location: [facility] NEURO ORS;  Service: Neurosurgery;  Laterality: Right;  Right L5-S1 Laminectomy   VIDEO BRONCHOSCOPY WITH ENDOBRONCHIAL NAVIGATION N/A 07/09/2021   Procedure: ROBOTIC VIDEO BRONCHOSCOPY WITH ENDOBRONCHIAL NAVIGATION;  Surgeon: Collene Gobble, MD;  Location: [place] ENDOSCOPY;  Service: Pulmonary;  Laterality: N/A;   VIDEO BRONCHOSCOPY WITH RADIAL ENDOBRONCHIAL ULTRASOUND  07/09/2021   Procedure: VIDEO BRONCHOSCOPY WITH RADIAL ENDOBRONCHIAL ULTRASOUND;  Surgeon: Collene Gobble, MD;  Location: [facility] ENDOSCOPY;  Service: Pulmonary;;    REVIEW OF SYSTEMS:  Constitutional: positive for fatigue Eyes: negative Ears,  nose, mouth, throat, and face: negative Respiratory: positive for cough and dyspnea on exertion Cardiovascular: negative Gastrointestinal: negative Genitourinary:negative Integument/breast: negative Hematologic/lymphatic: negative Musculoskeletal:negative Neurological: negative Behavioral/Psych: negative Endocrine: negative Allergic/Immunologic: negative   PHYSICAL EXAMINATION: General appearance: alert, cooperative, fatigued, and no distress Head: Normocephalic, without obvious abnormality, atraumatic Neck: no adenopathy, no JVD, supple, symmetrical, trachea midline, and thyroid not enlarged, symmetric, no tenderness/mass/nodules Lymph nodes: Cervical, supraclavicular, and axillary nodes normal. Resp: wheezes LLL Back: symmetric, no curvature. ROM normal. No CVA tenderness. Cardio: regular rate and rhythm, S1, S2 normal, no murmur, click, rub or gallop GI: soft, non-tender; bowel sounds normal; no masses,  no organomegaly Extremities: extremities normal, atraumatic, no cyanosis or edema Neurologic: Alert and oriented X 3, normal strength and tone. Normal symmetric reflexes. Normal coordination and gait  ECOG PERFORMANCE STATUS: 1 - Symptomatic but completely ambulatory  Blood pressure 132/63, pulse 76, temperature (!) 97.1 F (36.2 C), temperature source Tympanic, resp. rate 20, height 6' (1.829 m), weight 202 lb 1.6 oz (91.7 kg), SpO2 94 %.  LABORATORY DATA: Lab Results  Component Value Date   WBC 4.1 11/19/2021   HGB 12.7 (L) 11/19/2021   HCT 37.8 (L) 11/19/2021   MCV 93.8 11/19/2021   PLT 187 11/19/2021      Chemistry      Component Value Date/Time   NA 140 11/19/2021 0836   NA 132 (L) 12/31/2019 1625   K 4.6 11/19/2021 0836   CL 101 11/19/2021 0836   CO2 31 11/19/2021 0836   BUN 44 (H) 11/19/2021 0836   BUN 26 12/31/2019 1625   CREATININE 1.43 (H) 11/19/2021 0836   CREATININE 1.03 01/09/2016 1129      Component Value Date/Time   CALCIUM 10.5 (H) 11/19/2021  0836   ALKPHOS 85 11/19/2021 0836   AST 12 (L) 11/19/2021 0836   ALT 16 11/19/2021 0836   BILITOT 0.4 11/19/2021 5361  RADIOGRAPHIC STUDIES: CT Chest W Contrast  Result Date: 11/19/2021 CLINICAL DATA:  History of non-small cell lung cancer for staging/follow-up. Post chemo and radiotherapy by report in this 81 year old male. EXAM: CT CHEST WITH CONTRAST TECHNIQUE: Multidetector CT imaging of the chest was performed during intravenous contrast administration. RADIATION DOSE REDUCTION: This exam was performed according to the departmental dose-optimization program which includes automated exposure control, adjustment of the mA and/or kV according to patient size and/or use of iterative reconstruction technique. CONTRAST:  37mL OMNIPAQUE IOHEXOL 300 MG/ML  SOLN COMPARISON:  Comparison is made with July 05, 2021. FINDINGS: Cardiovascular: Signs of prior myocardial infarction involving the lateral wall of the LEFT ventricle with sub endocardial fat deposition similar to prior imaging. Extensive 3 vessel coronary artery calcification. Mild cardiac enlargement without pericardial effusion. Calcified and noncalcified atheromatous plaque of the thoracic aorta without aneurysmal dilation. Central pulmonary vasculature is normal caliber. Unremarkable on venous phase assessment. Mediastinum/Nodes: Esophagus is grossly normal. No adenopathy in the chest. Lungs/Pleura: Patchy ground-glass is developed in the RIGHT middle lobe. Masslike area tethering the fissure in the LEFT chest has diminished in size measuring 2.6 x 2.0 cm greatest axial dimension (image 53/7) this previously measured 3.1 x 2.9 cm. Consolidative changes in the LEFT mid chest arise from the LEFT hilum and most indicative of post radiation changes extending from the hilum into the peripheral LEFT chest and LEFT upper lobe as well as superior segment of the LEFT lower lobe. Biapical scarring as before. New pulmonary nodule in the LEFT lower  lobe with spiculated morphology (image 110/7) 15 x 10 mm. Mild pulmonary emphysema. Patent airways in the RIGHT chest with mild narrowing of airways at the LEFT hilum particularly to LEFT upper lobe and superior segment of LEFT lower lobe. No pleural effusion. Upper Abdomen: Incidental imaging of upper abdominal contents shows normal appearance of imaged portions of the adrenal glands and no acute findings. Musculoskeletal: No acute bone finding. No destructive bone process. Spinal degenerative changes. IMPRESSION: 1. New pulmonary nodule in the LEFT lower lobe with spiculated morphology, measuring 15 x 10 mm. Findings are concerning for metastatic disease. 2. Post radiation changes about the LEFT hilum extending into the peripheral LEFT upper and lower lobe as described with diminishing size of the pulmonary mass along the major fissure in the LEFT chest. 3. Patchy areas of ground-glass in the RIGHT middle lobe and to a lesser extent RIGHT lower lobe may reflect mild infectious or inflammatory process, attention on follow-up. 4. Coronary artery disease and aortic atherosclerosis with mild pulmonary emphysema. 5. Signs of prior myocardial infarction. Aortic Atherosclerosis (ICD10-I70.0) and Emphysema (ICD10-J43.9). Electronically Signed   By: Zetta Bills M.D.   On: 11/19/2021 22:49   NM PET Image Restage (PS) Skull Base to Thigh (F-18 FDG)  Result Date: 12/11/2021 CLINICAL DATA:  Subsequent treatment strategy for non-small cell lung cancer. EXAM: NUCLEAR MEDICINE PET SKULL BASE TO THIGH TECHNIQUE: 10.6 mCi F-18 FDG was injected intravenously. Full-ring PET imaging was performed from the skull base to thigh after the radiotracer. CT data was obtained and used for attenuation correction and anatomic localization. Fasting blood glucose: 358 mg/dl COMPARISON:  Chest CT 11/19/2021. PET-CT 07/05/2021 FINDINGS: Mediastinal blood pool activity: SUV max 3.5 Liver activity: SUV max NA NECK: No hypermetabolic lymph  nodes in the neck. Incidental CT findings: none CHEST: Post radiation fibrosis in the left upper lobe and left lower lobe shows low level FDG uptake with SUV max = 4.1. There is some more focal  uptake in the left suprahilar region, indeterminate. New left lower lobe pulmonary nodule of concern on recent diagnostic chest CT shows FDG accumulation with SUV max = 2.6. Incidental CT findings: Coronary artery calcification is evident. Atherosclerotic calcification noted in the thoracic aorta. ABDOMEN/PELVIS: No abnormal hypermetabolic activity within the liver, pancreas, adrenal glands, or spleen. No hypermetabolic lymph nodes in the abdomen or pelvis. Subtle ill-defined wispy focus of soft tissue attenuation lateral to the stomach on image 114/4 shows hypermetabolism with SUV max = 4.2. Incidental CT findings: Gallbladder surgically absent nonobstructing stone noted lower pole left kidney. Abdominal aortic aneurysm repair. SKELETON: No focal hypermetabolic activity to suggest skeletal metastasis. Status post Incidental CT findings: No worrisome lytic or sclerotic osseous abnormality. IMPRESSION: 1. Low level hypermetabolism again noted in the left lung post treatment fibrosis. 2. New 10 x 15 mm left lower lobe pulmonary nodule on recent diagnostic chest CT shows low level FDG accumulation. Given discernible FDG accumulation in a tiny nodule of this size, findings are concerning for neoplasm. Metastatic disease or metachronous primary would both be considerations. 3. No evidence for hypermetabolic metastatic disease in the abdomen or pelvis. 4.  Aortic Atherosclerois (ICD10-170.0) Electronically Signed   By: Misty Stanley M.D.   On: 12/11/2021 10:14    ASSESSMENT AND PLAN: This is a very pleasant 81 years old white male diagnosed with a stage IIb (T3, N0, M0) non-small cell lung cancer, squamous cell carcinoma in September 2022 1 presented with large left lower lobe lung mass in addition to suspicious left hilar  lymphadenopathy. The patient is status post SBRT to the large left lower lobe lung mass in 10 fractions. He tolerated his previous treatment fairly well. He is currently on observation. Unfortunately the scan showed new pulmonary nodule in the left lower lobe with spiculated morphology suspicious for metastatic disease.  There was postradiation changes in the previously radiated left lower lobe lung mass area. The patient had a PET scan performed recently.  I personally and independently reviewed the scan images and discussed the result and showed the images to the patient and his wife. The PET scan showed low level hypermetabolism in the left lung posttreatment fibrosis and the new left lower lobe pulmonary nodule also showed low FDG accumulation but still concerning for neoplasm. I recommended for the patient to see Dr. Lamonte Sakai for consideration of repeat bronchoscopy and biopsy of this lesion. I will see him back for follow-up visit in 1 months for evaluation and discussion of his treatment options based on the biopsy results. He was advised to call immediately if he has any other concerning symptoms in the interval. The patient voices understanding of current disease status and treatment options and is in agreement with the current care plan.  All questions were answered. The patient knows to call the clinic with any problems, questions or concerns. We can certainly see the patient much sooner if necessary.  The total time spent in the appointment was 30 minutes.  Disclaimer: This note was dictated with voice recognition software. Similar sounding words can inadvertently be transcribed and may not be corrected upon review.

## 2021-12-17 ENCOUNTER — Encounter: Payer: Self-pay | Admitting: *Deleted

## 2021-12-17 NOTE — Progress Notes (Signed)
Oncology Nurse Navigator Documentation  Oncology Nurse Navigator Flowsheets 12/17/2021 12/13/2021 07/24/2021  Abnormal Finding Date - - 02/16/2021  Confirmed Diagnosis Date - - 07/09/2021  Diagnosis Status - - Confirmed Diagnosis Complete  Planned Course of Treatment - - Radiation  Phase of Treatment - - Radiation  Navigator Follow Up Date: 01/10/2022 - 07/25/2021  Navigator Follow Up Reason: Follow-up After Biopsy - Appointment Review  Navigator Location [facility]  Referral Date to RadOnc/MedOnc - - 07/13/2021  Navigator Encounter Type Telephone Clinic/MDC Clinic/MDC;Initial MedOnc  Telephone Outgoing Call - -  Patient Visit Type Other MedOnc;Follow-up MedOnc;Initial  Treatment Phase Abnormal Scans Abnormal Scans Pre-Tx/Tx Discussion  Barriers/Navigation Needs Education/I followed up on Mr. Kuenzi schedule. He is set up to see Dr. Lamonte Sakai this week and a follow up with Dr. Julien Nordmann in a few weeks. I called to update. He verbalized understanding of appts time and place.  Coordination of Care Coordination of Care;Education  Education Other - Other;Newly Diagnosed Cancer Education  Interventions Education;Psycho-Social Support Coordination of Care Coordination of Care;Education;Psycho-Social Support  Acuity Level 2-Minimal Needs (1-2 Barriers Identified) Level 2-Minimal Needs (1-2 Barriers Identified) Level 2-Minimal Needs (1-2 Barriers Identified)  Coordination of Care - Other Other  Education Method Verbal - Written;Verbal  Support Groups/Services - - Other  Time Spent with Patient [phone]

## 2021-12-18 DIAGNOSIS — I5042 Chronic combined systolic (congestive) and diastolic (congestive) heart failure: Secondary | ICD-10-CM | POA: Diagnosis not present

## 2021-12-18 DIAGNOSIS — E7800 Pure hypercholesterolemia, unspecified: Secondary | ICD-10-CM | POA: Diagnosis not present

## 2021-12-18 DIAGNOSIS — E1165 Type 2 diabetes mellitus with hyperglycemia: Secondary | ICD-10-CM | POA: Diagnosis not present

## 2021-12-18 DIAGNOSIS — I10 Essential (primary) hypertension: Secondary | ICD-10-CM | POA: Diagnosis not present

## 2021-12-18 DIAGNOSIS — I4891 Unspecified atrial fibrillation: Secondary | ICD-10-CM | POA: Diagnosis not present

## 2021-12-19 ENCOUNTER — Institutional Professional Consult (permissible substitution): Payer: Medicare Other | Admitting: Emergency Medicine

## 2021-12-20 ENCOUNTER — Ambulatory Visit: Payer: Medicare Other | Admitting: Emergency Medicine

## 2021-12-20 ENCOUNTER — Encounter: Payer: Self-pay | Admitting: Emergency Medicine

## 2021-12-20 ENCOUNTER — Other Ambulatory Visit: Payer: Self-pay

## 2021-12-20 VITALS — BP 108/64 | HR 84 | Temp 97.9°F | Ht 72.0 in | Wt 200.0 lb

## 2021-12-20 DIAGNOSIS — R918 Other nonspecific abnormal finding of lung field: Secondary | ICD-10-CM | POA: Diagnosis not present

## 2021-12-20 DIAGNOSIS — C3492 Malignant neoplasm of unspecified part of left bronchus or lung: Secondary | ICD-10-CM

## 2021-12-20 NOTE — Assessment & Plan Note (Signed)
Detailed discussion regarding his PET scan, CT scan and the new pulmonary nodule.  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  I offered him the option of watchful waiting with a repeat CT scan to look for interval change versus pursuing tissue diagnosis now.  He is decided he wants to have the procedure now to try to get a tissue diagnosis and guide therapy.  I will arrange for navigational bronchoscopy.   We will work on setting up navigational bronchoscopy to evaluate your new left lower lobe pulmonary nodule.  This will be done as an outpatient under general anesthesia at [facility] endoscopy.  You will need a designated driver.  We will try to get this set up for 12/31/2021 You will need to stop your Xarelto 2 days prior to the procedure You will need to stop your Trental 5 days prior to the procedure Keep albuterol available to use 2 puffs up to every 4 hours if needed for shortness of breath, chest tightness, wheezing.  Follow with Dr Lamonte Sakai in 1 month

## 2021-12-20 NOTE — Progress Notes (Signed)
Subjective:    Patient ID: [name], male    DOB: 12-15-1940, 81 y.o.   MRN: [id number]  HPI  ROV 08/08/21 --[name] is 14 and follows up today for his pulmonary nodular disease.  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  He is currently undergoing SBRT. He is having some cough, dark red old hemoptysis. Having a bit of weakness. Overall doing well.    ROV 12/20/2021 --81 year old gentleman, former smoker whom I have seen for pulmonary nodular disease originally discovered April 2022.  I biopsied a 3.9 cm left superior segmental nodule that was diagnosed as squamous cell lung cancer 07/09/2021.  He underwent SBRT.  He is doing surveillance with Dr. Julien Nordmann and is referred back today after PET scan 12/10/2021 identified a new rounded 10 x 15 mm left lower lobe pulmonary nodule with some low-level hypermetabolism as below.  He is coughing daily, tan color, no blood. He has some difficulty getting around, used a walker.   PET scan 12/10/2021 reviewed by me shows postradiation change in the left upper lobe and left lower lobe with a decrease in size of the superior segmental mass.  There is some SUV uptake in the postradiation fibrotic change.  There is a new left lower lobe pulmonary nodule that is rounded, spiculated with an SUV max of 2.6.    Review of Systems As per HPI  Past Medical History:  Diagnosis Date   AAA (abdominal aortic aneurysm)    a. s/p repair 2009.   Acid reflux    takes Prilosec and Protonix daily   Arthritis    BacK   CAD in native artery    a. a. prior inf MI 1993. b. PCI to LAD 05/1997. c. recurrent inferolateral MI complicated by V. fib arrest in 04/1998, prior PCI to OM1. d. 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   OSA on CPAP    uses CPAP--sleep study done at least 27yrs ago   PAF (paroxysmal atrial fibrillation) (HCC)    Pain    back pain chronic- seen at pain clinic   Plantar fasciitis    bilatetral   Pneumonia    Thrombocytopenia ([place])    Type II diabetes mellitus ([place])      Family History  Problem Relation Age of Onset   Diabetes Mother    Coronary artery disease Mother    Diabetes Father    Arthritis Father    Rheum arthritis Father    Coronary artery disease Father      Social History   Socioeconomic History   Marital status: Married    Spouse name: Not  on file   Number of children: Not on file   Years of education: Not on file   Highest education level: Not on file  Occupational History   Not on file  Tobacco Use   Smoking status: Former    Packs/day: 1.00    Years: 33.00    Pack years: 33.00    Types: Cigarettes    Quit date: 01/01/1992    Years since quitting: 29.9   Smokeless tobacco: Never  Vaping Use   Vaping Use: Never used  Substance and Sexual Activity   Alcohol use: Yes    Comment: rare   Drug use: No   Sexual activity: Yes  Other Topics Concern   Not on file  Social History Narrative   Not on file   Social Determinants of Health   Financial Resource Strain: Not on file  Food Insecurity: Not on file  Transportation Needs: Not on file  Physical Activity: Not on file  Stress: Not on file  Social Connections: Not on file  Intimate Partner Violence: Not on file    [place] up [place] in [place] 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   Allergies  Allergen Reactions   Cymbalta [Duloxetine Hcl] Nausea And Vomiting and Other (See Comments)    PATIENT STATED THAT HE FELT LIKE HE "WAS GOING TO DIE" Rapid drop in blood pressure; sent him to the ER X2   Neurontin [Gabapentin] Other (See Comments)    "Makes me goofy, keeps me off balance, clouds my thinking.."   Keppra [Levetiracetam] Nausea And Vomiting     Outpatient Medications Prior to Visit  Medication Sig Dispense Refill   ACCU-CHEK SMARTVIEW test strip CHECK 1 TO 2 TIMES A DAY     albuterol (VENTOLIN HFA) 108 (90 Base) MCG/ACT inhaler 1-2 puff as needed     amiodarone (PACERONE) 200 MG tablet Take 1 tablet (200 mg total) by mouth at bedtime. 90 tablet 3   amitriptyline (ELAVIL) 10 MG tablet Take 10 mg by mouth at bedtime.     amitriptyline (ELAVIL) 50 MG tablet Take 50 mg by mouth at bedtime.     atorvastatin (LIPITOR) 10 MG tablet Take 10 mg by mouth in the morning.     Cholecalciferol (VITAMIN D3) 50 MCG (2000 UT) TABS Take 2,000 Units by mouth daily.     Coenzyme Q10 (COQ10) 200 MG CAPS Take 200 mg by mouth in the morning.     Cyanocobalamin (B-12) 5000 MCG CAPS Take 5,000 mcg by mouth daily. 30 capsule 0   famotidine (PEPCID) 20 MG tablet Take 20 mg by mouth daily as needed for heartburn or indigestion.     finasteride (PROPECIA) 1 MG tablet Take 1 mg by mouth daily.     furosemide (LASIX) 40 MG tablet Take 1 tablet (40 mg total) by mouth daily as needed (fluid retention/edema).     glimepiride (AMARYL) 2 MG tablet Take 3 tablets (6 mg total) by mouth daily with breakfast. 90 tablet 0   JARDIANCE 25 MG TABS tablet Take 25 mg by mouth every evening.     lactose free nutrition (BOOST) LIQD Take 237 mLs by mouth in the morning.     Lidocaine HCl 4 % CREA Apply 1 application topically 4 (four) times daily as needed (pain.).     metFORMIN (GLUCOPHAGE-XR) 500 MG 24 hr tablet Take 500-1,000 mg by mouth See admin  instructions. Take 2 tablets (1000  mg) by mouth in the morning & take 1 tablet (500 mg) by mouth in the evening.     metoprolol tartrate (LOPRESSOR) 50 MG tablet Take 50 mg by mouth 2 (two) times daily.     mirtazapine (REMERON) 15 MG tablet Take 1 tablet (15 mg total) by mouth at bedtime. 30 tablet 0   Multiple Vitamin (MULTIVITAMIN WITH MINERALS) TABS tablet Take 1 tablet by mouth daily.     ondansetron (ZOFRAN ODT) 4 MG disintegrating tablet Take 1 tablet (4 mg total) by mouth every 8 (eight) hours as needed for nausea or vomiting. 20 tablet 0   ondansetron (ZOFRAN) 4 MG tablet Take 4 mg by mouth every 6 (six) hours as needed.     oxyCODONE-acetaminophen (PERCOCET) 7.5-325 MG tablet Take 1-2 tablets by mouth at bedtime as needed (pain.).     OZEMPIC, 0.25 OR 0.5 MG/DOSE, 2 MG/1.5ML SOPN Inject 0.25 mg into the skin every Tuesday.     pentoxifylline (TRENTAL) 400 MG CR tablet Take 1 tablet (400 mg total) by mouth 3 (three) times daily with meals. OK to restart on 07/10/21 90 tablet 3   PFIZER-BIONT COVID-19 VAC-TRIS SUSP injection      pregabalin (LYRICA) 300 MG capsule Take 300 mg by mouth 2 (two) times daily.     rivaroxaban (XARELTO) 20 MG TABS tablet Take 1 tablet (20 mg total) by mouth every evening. OK to restart this medication on 07/10/21 30 tablet 0   senna-docusate (SENOKOT-S) 8.6-50 MG tablet Take 1 tablet by mouth daily as needed (constipation.).     acetaminophen (TYLENOL) 325 MG tablet Take 650 mg by mouth every 6 (six) hours as needed (pain/fever).     No facility-administered medications prior to visit.         Objective:   Physical Exam Vitals:   12/20/21 1504  BP: 108/64  Pulse: 84  Temp: 97.9 F (36.6 C)  TempSrc: Oral  SpO2: 94%  Weight: 200 lb (90.7 kg)  Height: 6' (1.829 m)   Gen: Pleasant, elderly gentleman with a walker, in no distress,  normal affect  ENT: No lesions,  mouth clear,  oropharynx clear, no postnasal drip  Neck: No JVD, no stridor  Lungs:  No use of accessory muscles, no crackles or wheezing on normal respiration, no wheeze on forced expiration  Cardiovascular: Regular, bradycardic, heart sounds normal, no murmur or gallops, no peripheral edema  Musculoskeletal: No deformities, no cyanosis or clubbing  Neuro: alert, awake, slightly tangential but non focal  Skin: Warm, no lesions or rash      Assessment & Plan:  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  I offered him the option of watchful waiting with a repeat CT scan to look for interval change versus pursuing tissue diagnosis now.  He is decided he wants to have the procedure now to try to get a tissue diagnosis and guide therapy.  I will arrange for navigational bronchoscopy.   We will work on setting up navigational bronchoscopy to evaluate your new left lower lobe pulmonary nodule.  This will be done as an outpatient under general anesthesia at [facility] endoscopy.  You will need a designated driver.  We will try to get this set up for 12/31/2021 You will need to stop your Xarelto 2 days prior to the procedure You will need to stop your  Trental 5 days prior to the procedure Keep albuterol available to use 2 puffs up to every 4 hours if needed for shortness of breath, chest tightness, wheezing.  Follow with Dr Lamonte Sakai in 1 month      Baltazar Apo, MD, PhD 12/20/2021, 3:39 PM [facility] [phone] or if no answer before 7:00PM call [phone] For any issues after 7:00PM please call eLink [phone]

## 2021-12-20 NOTE — H&P (View-Only) (Signed)
Subjective:    Patient ID: [name], male    DOB: 04/06/1941, 81 y.o.   MRN: [id number]  HPI  ROV 08/08/21 --[name] is 52 and follows up today for his pulmonary nodular disease.  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  He is currently undergoing SBRT. He is having some cough, dark red old hemoptysis. Having a bit of weakness. Overall doing well.    ROV 12/20/2021 --81 year old [name], former smoker whom I have seen for pulmonary nodular disease originally discovered April 2022.  I biopsied a 3.9 cm left superior segmental nodule that was diagnosed as squamous cell lung cancer 07/09/2021.  He underwent SBRT.  He is doing surveillance with Dr. Julien Nordmann and is referred back today after PET scan 12/10/2021 identified a new rounded 10 x 15 mm left lower lobe pulmonary nodule with some low-level hypermetabolism as below.  He is coughing daily, tan color, no blood. He has some difficulty getting around, used a walker.   PET scan 12/10/2021 reviewed by me shows postradiation change in the left upper lobe and left lower lobe with a decrease in size of the superior segmental mass.  There is some SUV uptake in the postradiation fibrotic change.  There is a new left lower lobe pulmonary nodule that is rounded, spiculated with an SUV max of 2.6.    Review of Systems As per HPI  Past Medical History:  Diagnosis Date   AAA (abdominal aortic aneurysm)    a. s/p repair 2009.   Acid reflux    takes Prilosec and Protonix daily   Arthritis    BacK   CAD in native artery    a. a. prior inf MI 1993. b. PCI to LAD 05/1997. c. recurrent inferolateral MI complicated by V. fib arrest in 04/1998, prior PCI to OM1. d. 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   OSA on CPAP    uses CPAP--sleep study done at least 99yrs ago   PAF (paroxysmal atrial fibrillation) (HCC)    Pain    back pain chronic- seen at pain clinic   Plantar fasciitis    bilatetral   Pneumonia    Thrombocytopenia ([place])    Type II diabetes mellitus ([place])      Family History  Problem Relation Age of Onset   Diabetes Mother    Coronary artery disease Mother    Diabetes Father    Arthritis Father    Rheum arthritis Father    Coronary artery disease Father      Social History   Socioeconomic History   Marital status: Married    Spouse name: Not  on file   Number of children: Not on file   Years of education: Not on file   Highest education level: Not on file  Occupational History   Not on file  Tobacco Use   Smoking status: Former    Packs/day: 1.00    Years: 33.00    Pack years: 33.00    Types: Cigarettes    Quit date: 01/01/1992    Years since quitting: 29.9   Smokeless tobacco: Never  Vaping Use   Vaping Use: Never used  Substance and Sexual Activity   Alcohol use: Yes    Comment: rare   Drug use: No   Sexual activity: Yes  Other Topics Concern   Not on file  Social History Narrative   Not on file   Social Determinants of Health   Financial Resource Strain: Not on file  Food Insecurity: Not on file  Transportation Needs: Not on file  Physical Activity: Not on file  Stress: Not on file  Social Connections: Not on file  Intimate Partner Violence: Not on file    Grew up on tobacco farm, in [place] 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   Allergies  Allergen Reactions   Cymbalta [Duloxetine Hcl] Nausea And Vomiting and Other (See Comments)    PATIENT STATED THAT HE FELT LIKE HE "WAS GOING TO DIE" Rapid drop in blood pressure; sent him to the ER X2   Neurontin [Gabapentin] Other (See Comments)    "Makes me goofy, keeps me off balance, clouds my thinking.."   Keppra [Levetiracetam] Nausea And Vomiting     Outpatient Medications Prior to Visit  Medication Sig Dispense Refill   ACCU-CHEK SMARTVIEW test strip CHECK 1 TO 2 TIMES A DAY     albuterol (VENTOLIN HFA) 108 (90 Base) MCG/ACT inhaler 1-2 puff as needed     amiodarone (PACERONE) 200 MG tablet Take 1 tablet (200 mg total) by mouth at bedtime. 90 tablet 3   amitriptyline (ELAVIL) 10 MG tablet Take 10 mg by mouth at bedtime.     amitriptyline (ELAVIL) 50 MG tablet Take 50 mg by mouth at bedtime.     atorvastatin (LIPITOR) 10 MG tablet Take 10 mg by mouth in the morning.     Cholecalciferol (VITAMIN D3) 50 MCG (2000 UT) TABS Take 2,000 Units by mouth daily.     Coenzyme Q10 (COQ10) 200 MG CAPS Take 200 mg by mouth in the morning.     Cyanocobalamin (B-12) 5000 MCG CAPS Take 5,000 mcg by mouth daily. 30 capsule 0   famotidine (PEPCID) 20 MG tablet Take 20 mg by mouth daily as needed for heartburn or indigestion.     finasteride (PROPECIA) 1 MG tablet Take 1 mg by mouth daily.     furosemide (LASIX) 40 MG tablet Take 1 tablet (40 mg total) by mouth daily as needed (fluid retention/edema).     glimepiride (AMARYL) 2 MG tablet Take 3 tablets (6 mg total) by mouth daily with breakfast. 90 tablet 0   JARDIANCE 25 MG TABS tablet Take 25 mg by mouth every evening.     lactose free nutrition (BOOST) LIQD Take 237 mLs by mouth in the morning.     Lidocaine HCl 4 % CREA Apply 1 application topically 4 (four) times daily as needed (pain.).     metFORMIN (GLUCOPHAGE-XR) 500 MG 24 hr tablet Take 500-1,000 mg by mouth See admin  instructions. Take 2 tablets (1000  mg) by mouth in the morning & take 1 tablet (500 mg) by mouth in the evening.     metoprolol tartrate (LOPRESSOR) 50 MG tablet Take 50 mg by mouth 2 (two) times daily.     mirtazapine (REMERON) 15 MG tablet Take 1 tablet (15 mg total) by mouth at bedtime. 30 tablet 0   Multiple Vitamin (MULTIVITAMIN WITH MINERALS) TABS tablet Take 1 tablet by mouth daily.     ondansetron (ZOFRAN ODT) 4 MG disintegrating tablet Take 1 tablet (4 mg total) by mouth every 8 (eight) hours as needed for nausea or vomiting. 20 tablet 0   ondansetron (ZOFRAN) 4 MG tablet Take 4 mg by mouth every 6 (six) hours as needed.     oxyCODONE-acetaminophen (PERCOCET) 7.5-325 MG tablet Take 1-2 tablets by mouth at bedtime as needed (pain.).     OZEMPIC, 0.25 OR 0.5 MG/DOSE, 2 MG/1.5ML SOPN Inject 0.25 mg into the skin every Tuesday.     pentoxifylline (TRENTAL) 400 MG CR tablet Take 1 tablet (400 mg total) by mouth 3 (three) times daily with meals. OK to restart on 07/10/21 90 tablet 3   PFIZER-BIONT COVID-19 VAC-TRIS SUSP injection      pregabalin (LYRICA) 300 MG capsule Take 300 mg by mouth 2 (two) times daily.     rivaroxaban (XARELTO) 20 MG TABS tablet Take 1 tablet (20 mg total) by mouth every evening. OK to restart this medication on 07/10/21 30 tablet 0   senna-docusate (SENOKOT-S) 8.6-50 MG tablet Take 1 tablet by mouth daily as needed (constipation.).     acetaminophen (TYLENOL) 325 MG tablet Take 650 mg by mouth every 6 (six) hours as needed (pain/fever).     No facility-administered medications prior to visit.         Objective:   Physical Exam Vitals:   12/20/21 1504  BP: 108/64  Pulse: 84  Temp: 97.9 F (36.6 C)  TempSrc: Oral  SpO2: 94%  Weight: 200 lb (90.7 kg)  Height: 6' (1.829 m)   Gen: Pleasant, elderly [name] with a walker, in no distress,  normal affect  ENT: No lesions,  mouth clear,  oropharynx clear, no postnasal drip  Neck: No JVD, no stridor  Lungs:  No use of accessory muscles, no crackles or wheezing on normal respiration, no wheeze on forced expiration  Cardiovascular: Regular, bradycardic, heart sounds normal, no murmur or gallops, no peripheral edema  Musculoskeletal: No deformities, no cyanosis or clubbing  Neuro: alert, awake, slightly tangential but non focal  Skin: Warm, no lesions or rash      Assessment & Plan:  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  I offered him the option of watchful waiting with a repeat CT scan to look for interval change versus pursuing tissue diagnosis now.  He is decided he wants to have the procedure now to try to get a tissue diagnosis and guide therapy.  I will arrange for navigational bronchoscopy.   We will work on setting up navigational bronchoscopy to evaluate your new left lower lobe pulmonary nodule.  This will be done as an outpatient under general anesthesia at [facility] endoscopy.  You will need a designated driver.  We will try to get this set up for 12/31/2021 You will need to stop your Xarelto 2 days prior to the procedure You will need to stop your  Trental 5 days prior to the procedure Keep albuterol available to use 2 puffs up to every 4 hours if needed for shortness of breath, chest tightness, wheezing.  Follow with Dr Lamonte Sakai in 1 month      Baltazar Apo, MD, PhD 12/20/2021, 3:39 PM [facility] [phone] or if no answer before 7:00PM call [phone] For any issues after 7:00PM please call eLink [phone]

## 2021-12-20 NOTE — Patient Instructions (Addendum)
We will work on setting up navigational bronchoscopy to evaluate your new left lower lobe pulmonary nodule.  This will be done as an outpatient under general anesthesia at [facility] endoscopy.  You will need a designated driver.  We will try to get this set up for 12/31/2021 You will need to stop your Xarelto 2 days prior to the procedure You will need to stop your Trental 5 days prior to the procedure Keep albuterol available to use 2 puffs up to every 4 hours if needed for shortness of breath, chest tightness, wheezing.  Follow with Dr Lamonte Sakai in 1 month

## 2021-12-24 DIAGNOSIS — M65871 Other synovitis and tenosynovitis, right ankle and foot: Secondary | ICD-10-CM | POA: Diagnosis not present

## 2021-12-24 DIAGNOSIS — M25571 Pain in right ankle and joints of right foot: Secondary | ICD-10-CM | POA: Diagnosis not present

## 2021-12-26 DIAGNOSIS — S065X9A Traumatic subdural hemorrhage with loss of consciousness of unspecified duration, initial encounter: Secondary | ICD-10-CM | POA: Diagnosis not present

## 2021-12-28 ENCOUNTER — Other Ambulatory Visit: Payer: Self-pay

## 2021-12-28 ENCOUNTER — Encounter (HOSPITAL_COMMUNITY): Payer: Self-pay | Admitting: Emergency Medicine

## 2021-12-28 ENCOUNTER — Other Ambulatory Visit: Payer: Self-pay | Admitting: Emergency Medicine

## 2021-12-28 ENCOUNTER — Telehealth: Payer: Self-pay | Admitting: Interventional Cardiology

## 2021-12-28 NOTE — Telephone Encounter (Signed)
Left message for [name] to call back.  Called pt and left message to call back.  I did instruct on pt's VM since BP is running low and he has gait issues, ok to take 25mg  BID through the weekend until Dr. Tamala Julian can review.  ? ?Will route to Dr. Tamala Julian for review and advisement.   ? ?Dr. Tamala Julian- to add on to previous message, looks like pt started Jardiance last July.  Last EF was 40-45% in July 2019.  Should we recheck that?  ?
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
## 2021-12-28 NOTE — Progress Notes (Signed)
DUE TO COVID-19 ONLY ONE VISITOR IS ALLOWED TO COME WITH YOU AND STAY IN THE WAITING ROOM ONLY DURING PRE OP AND PROCEDURE DAY OF SURGERY.  ? ?PCP - Dr [name] ?Cardiologist - Dr Daneen Schick ?Oncology - Dr Curt Bears ? ?CT Chest x-ray - 11/19/21 ?EKG - 07/13/21 ?Stress Test - 2008 ?ECHO - 05/09/18 ?Cardiac Cath - 2012 ? ?ICD Pacemaker/Loop - n/a ? ?Sleep Study -  Yes ?CPAP - uses nightly ? ?Do not take metformin and glimepiride on the morning of surgery.  Do not take jardiance Sunday night or on DOS. ? ?If your blood sugar is less than 70 mg/dL, you will need to treat for low blood sugar: ?Treat a low blood sugar (less than 70 mg/dL) with ? cup of clear juice (cranberry or apple), 4 glucose tablets, OR glucose gel. ?Recheck blood sugar in 15 minutes after treatment (to make sure it is greater than 70 mg/dL). If your blood sugar is not greater than 70 mg/dL on recheck, call [phone] for further instructions. ? ?Blood Thinner Instructions:  Follow your surgeon's instructions on when to stop Xarelto prior to surgery. Last dose was on 12/28/21. ? ?Anesthesia review: Yes ? ?STOP now taking any Aspirin (unless otherwise instructed by your surgeon), Aleve, Naproxen, Ibuprofen, Motrin, Advil, Goody's, BC's, all herbal medications, fish oil, and all vitamins.  ? ?Coronavirus Screening ?Covid test n/a Ambulatory Surgery  ?Do you have any of the following symptoms:  ?Cough yes/no: No ?Fever (>100.20F)  yes/no: No ?Runny nose yes/no: No ?Sore throat yes/no: No ?Difficulty breathing/shortness of breath  yes/no: No ? ?Have you traveled in the last 14 days and where? yes/no: No ? ?Wife Patient verbalized understanding of instructions that were given via phone. ?

## 2021-12-28 NOTE — Anesthesia Preprocedure Evaluation (Addendum)
Anesthesia Evaluation  ?Patient identified by MRN, date of birth, ID band ?Patient awake ? ? ? ?Airway ?Mallampati: II ? ?TM Distance: >3 FB ?Neck ROM: Full ? ? ? Dental ? ?(+) Upper Dentures, Lower Dentures ?  ?Pulmonary ?sleep apnea and Continuous Positive Airway Pressure Ventilation , former smoker,  ?Pulmonary nodules ?  ?Pulmonary exam normal ? ? ? ? ? ? ? Cardiovascular ?hypertension, Pt. on medications and Pt. on home beta blockers ?+ CAD, + Past MI (1993), + Cardiac Stents (1998), + Peripheral Vascular Disease (AAA s/p repair 2009, on Xarelto) and +CHF  ? ?Rhythm:Regular Rate:Normal ? ? ?  ?Neuro/Psych ?Depression negative neurological ROS ?   ? GI/Hepatic ?Neg liver ROS, GERD  ,  ?Endo/Other  ?diabetes, Poorly Controlled, Type 2, Insulin Dependent, Oral Hypoglycemic Agents ? Renal/GU ?  ?negative genitourinary ?  ?Musculoskeletal ? ?(+) Arthritis , Osteoarthritis,   ? Abdominal ?Normal abdominal exam  (+)   ?Peds ? Hematology ?negative hematology ROS ?(+)   ?Anesthesia Other Findings ? ? Reproductive/Obstetrics ? ?  ? ? ? ? ? ? ? ? ? ? ? ? ? ?  ?  ? ? ? ? ? ? ?Anesthesia Physical ?Anesthesia Plan ? ?ASA: 3 ? ?Anesthesia Plan: General  ? ?Post-op Pain Management:   ? ?Induction: Intravenous ? ?PONV Risk Score and Plan: 2 and Ondansetron, Dexamethasone and Treatment may vary due to age or medical condition ? ?Airway Management Planned: Mask and Oral ETT ? ?Additional Equipment: None ? ?Intra-op Plan:  ? ?Post-operative Plan: Extubation in OR ? ?Informed Consent: I have reviewed the patients History and Physical, chart, labs and discussed the procedure including the risks, benefits and alternatives for the proposed anesthesia with the patient or authorized representative who has indicated his/her understanding and acceptance.  ? ? ? ?Dental advisory given ? ?Plan Discussed with:  ? ?Anesthesia Plan Comments: (PAT note written 12/28/2021 by Myra Gianotti, PA-C. ?- DOS note: FSG  398 in pre-procedure. Will give sq insulin and re-check per protocol ? ?Lab Results ?     Component                Value               Date                 ?     WBC                      5.2                 12/31/2021           ?     HGB                      13.8                12/31/2021           ?     HCT                      40.8                12/31/2021           ?     MCV                      92.3  12/31/2021           ?     PLT                      152                 12/31/2021           ?Lab Results ?     Component                Value               Date                 ?     NA                       135                 12/31/2021           ?     K                        5.0                 12/31/2021           ?     CO2                      27                  12/31/2021           ?     GLUCOSE                  458 (H)             12/31/2021           ?     BUN                      37 (H)              12/31/2021           ?     CREATININE               1.36 (H)            12/31/2021           ?     CALCIUM                  9.7                 12/31/2021           ?     GFRNONAA                 52 (L)              12/31/2021           ?Echo 05/09/18:? ?- Left ventricle: The cavity size was mildly dilated. Wall thickness was increased in a pattern of mild LVH. Systolic function was mildly to moderately reduced. The estimated ejection fraction was in the range of 40% to 45%. There is hypokinesis of the inferolateral and inferior myocardium. Doppler parameters are consistent with abnormal left ventricular relaxation (grade 1 diastolic dysfunction).  ?- Aortic valve:  Mildly calcified annulus. Trileaflet.  ?- Mitral valve: There was trivial regurgitation.  ?- Left atrium: The atrium was mildly to moderately dilated.  ?- Right atrium: Central venous pressure (est): 3 mm Hg.  ?- Atrial septum: No defect or patent foramen ovale was identified.  ?- Tricuspid valve: There was trivial regurgitation.  ?-  Pulmonary arteries: Systolic pressure could not be accurately estimated.  ?- Pericardium, extracardiac: There was no pericardial effusion. )  ? ? ? ? ? ?Anesthesia Quick Evaluation ? ?

## 2021-12-28 NOTE — Telephone Encounter (Signed)
Pt c/o medication issue: ? ?1. Name of Medication: metoprolol tartrate (LOPRESSOR) 50 MG tablet ? ?2. How are you currently taking this medication (dosage and times per day)? As directed ? ?3. Are you having a reaction (difficulty breathing--STAT)? no ? ?4. What is your medication issue? Patient had low BP this morning at his PCP's office. He was dehydrated and at risk for falls - patient uses a cane to walk. Dr. Laurann Montana wants to know if Dr. Tamala Julian would be agreeable to decrease the medication to 25mg  2x daily.  ? ?Pt c/o BP issue: STAT if pt c/o blurred vision, one-sided weakness or slurred speech ? ?1. What are your last 5 BP readings? 102/60 at Dr. Delene Ruffini office today ? ?2. Are you having any other symptoms (ex. Dizziness, headache, blurred vision, passed out)? no ? ?3. What is your BP issue? Patient had low BP at his PCP today, pt uses a cane so is at risk for falls, he was found to be dehydrated as well.   ?

## 2021-12-29 LAB — SARS CORONAVIRUS 2 (TAT 6-24 HRS): SARS Coronavirus 2: NEGATIVE

## 2021-12-31 ENCOUNTER — Ambulatory Visit (HOSPITAL_COMMUNITY): Payer: Medicare Other

## 2021-12-31 ENCOUNTER — Encounter (HOSPITAL_COMMUNITY): Payer: Self-pay | Admitting: Emergency Medicine

## 2021-12-31 ENCOUNTER — Encounter (HOSPITAL_COMMUNITY)
Admission: RE | Disposition: A | Discharge status: Home / Self Care | Payer: Self-pay | Source: Home / Self Care | Attending: Emergency Medicine

## 2021-12-31 ENCOUNTER — Ambulatory Visit (HOSPITAL_BASED_OUTPATIENT_CLINIC_OR_DEPARTMENT_OTHER): Payer: Medicare Other | Admitting: Vascular Surgery

## 2021-12-31 ENCOUNTER — Other Ambulatory Visit: Payer: Self-pay

## 2021-12-31 ENCOUNTER — Ambulatory Visit (HOSPITAL_COMMUNITY)
Admission: RE | Admit: 2021-12-31 | Discharge: 2021-12-31 | Disposition: A | Discharge status: Home / Self Care | Payer: Medicare Other | Attending: Emergency Medicine | Admitting: Emergency Medicine

## 2021-12-31 ENCOUNTER — Ambulatory Visit (HOSPITAL_COMMUNITY): Payer: Medicare Other | Admitting: Vascular Surgery

## 2021-12-31 DIAGNOSIS — Z7984 Long term (current) use of oral hypoglycemic drugs: Secondary | ICD-10-CM | POA: Insufficient documentation

## 2021-12-31 DIAGNOSIS — Z7901 Long term (current) use of anticoagulants: Secondary | ICD-10-CM | POA: Diagnosis not present

## 2021-12-31 DIAGNOSIS — I252 Old myocardial infarction: Secondary | ICD-10-CM | POA: Diagnosis not present

## 2021-12-31 DIAGNOSIS — E1165 Type 2 diabetes mellitus with hyperglycemia: Secondary | ICD-10-CM | POA: Diagnosis not present

## 2021-12-31 DIAGNOSIS — C3432 Malignant neoplasm of lower lobe, left bronchus or lung: Secondary | ICD-10-CM | POA: Diagnosis not present

## 2021-12-31 DIAGNOSIS — I509 Heart failure, unspecified: Secondary | ICD-10-CM | POA: Insufficient documentation

## 2021-12-31 DIAGNOSIS — Z923 Personal history of irradiation: Secondary | ICD-10-CM | POA: Diagnosis not present

## 2021-12-31 DIAGNOSIS — R918 Other nonspecific abnormal finding of lung field: Secondary | ICD-10-CM | POA: Diagnosis not present

## 2021-12-31 DIAGNOSIS — F32A Depression, unspecified: Secondary | ICD-10-CM | POA: Diagnosis not present

## 2021-12-31 DIAGNOSIS — Z9889 Other specified postprocedural states: Secondary | ICD-10-CM

## 2021-12-31 DIAGNOSIS — E119 Type 2 diabetes mellitus without complications: Secondary | ICD-10-CM | POA: Insufficient documentation

## 2021-12-31 DIAGNOSIS — I110 Hypertensive heart disease with heart failure: Secondary | ICD-10-CM | POA: Insufficient documentation

## 2021-12-31 DIAGNOSIS — G4733 Obstructive sleep apnea (adult) (pediatric): Secondary | ICD-10-CM | POA: Insufficient documentation

## 2021-12-31 DIAGNOSIS — K219 Gastro-esophageal reflux disease without esophagitis: Secondary | ICD-10-CM | POA: Insufficient documentation

## 2021-12-31 DIAGNOSIS — I2510 Atherosclerotic heart disease of native coronary artery without angina pectoris: Secondary | ICD-10-CM | POA: Diagnosis not present

## 2021-12-31 DIAGNOSIS — Z794 Long term (current) use of insulin: Secondary | ICD-10-CM | POA: Insufficient documentation

## 2021-12-31 DIAGNOSIS — Z79899 Other long term (current) drug therapy: Secondary | ICD-10-CM | POA: Diagnosis not present

## 2021-12-31 DIAGNOSIS — I480 Paroxysmal atrial fibrillation: Secondary | ICD-10-CM | POA: Insufficient documentation

## 2021-12-31 DIAGNOSIS — Z419 Encounter for procedure for purposes other than remedying health state, unspecified: Secondary | ICD-10-CM

## 2021-12-31 DIAGNOSIS — R911 Solitary pulmonary nodule: Secondary | ICD-10-CM

## 2021-12-31 DIAGNOSIS — Z955 Presence of coronary angioplasty implant and graft: Secondary | ICD-10-CM | POA: Insufficient documentation

## 2021-12-31 DIAGNOSIS — Z87891 Personal history of nicotine dependence: Secondary | ICD-10-CM | POA: Diagnosis not present

## 2021-12-31 HISTORY — PX: VIDEO BRONCHOSCOPY WITH RADIAL ENDOBRONCHIAL ULTRASOUND: SHX6849

## 2021-12-31 HISTORY — PX: BRONCHIAL BIOPSY: SHX5109

## 2021-12-31 HISTORY — PX: BRONCHIAL NEEDLE ASPIRATION BIOPSY: SHX5106

## 2021-12-31 HISTORY — PX: BRONCHIAL WASHINGS: SHX5105

## 2021-12-31 HISTORY — PX: BRONCHIAL BRUSHINGS: SHX5108

## 2021-12-31 HISTORY — PX: FIDUCIAL MARKER PLACEMENT: SHX6858

## 2021-12-31 LAB — CBC
HCT: 40.8 % (ref 39.0–52.0)
Hemoglobin: 13.8 g/dL (ref 13.0–17.0)
MCH: 31.2 pg (ref 26.0–34.0)
MCHC: 33.8 g/dL (ref 30.0–36.0)
MCV: 92.3 fL (ref 80.0–100.0)
Platelets: 152 10*3/uL (ref 150–400)
RBC: 4.42 MIL/uL (ref 4.22–5.81)
RDW: 14.8 % (ref 11.5–15.5)
WBC: 5.2 10*3/uL (ref 4.0–10.5)
nRBC: 0 % (ref 0.0–0.2)

## 2021-12-31 LAB — BASIC METABOLIC PANEL
Anion gap: 8 (ref 5–15)
BUN: 37 mg/dL — ABNORMAL HIGH (ref 8–23)
CO2: 27 mmol/L (ref 22–32)
Calcium: 9.7 mg/dL (ref 8.9–10.3)
Chloride: 100 mmol/L (ref 98–111)
Creatinine, Ser: 1.36 mg/dL — ABNORMAL HIGH (ref 0.61–1.24)
GFR, Estimated: 52 mL/min — ABNORMAL LOW (ref >60)
Glucose, Bld: 458 mg/dL — ABNORMAL HIGH (ref 70–99)
Potassium: 5 mmol/L (ref 3.5–5.1)
Sodium: 135 mmol/L (ref 135–145)

## 2021-12-31 LAB — GLUCOSE, CAPILLARY
Glucose-Capillary: 401 mg/dL — ABNORMAL HIGH (ref 70–99)
Glucose-Capillary: 352 mg/dL — ABNORMAL HIGH (ref 70–99)
Glucose-Capillary: 243 mg/dL — ABNORMAL HIGH (ref 70–99)

## 2021-12-31 SURGERY — BRONCHOSCOPY, WITH BIOPSY USING ELECTROMAGNETIC NAVIGATION
Anesthesia: General

## 2021-12-31 MED ORDER — LACTATED RINGERS IV SOLN: INTRAVENOUS | Status: DC

## 2021-12-31 MED ORDER — RIVAROXABAN 20 MG PO TABS: 20.0000 mg | ORAL_TABLET | Freq: Every evening | ORAL | 0 refills | Status: AC

## 2021-12-31 MED ORDER — SUGAMMADEX SODIUM 200 MG/2ML IV SOLN
INTRAVENOUS | Status: DC | PRN
Start: 2021-12-31 — End: 2021-12-31
  Administered 2021-12-31: 200 mg via INTRAVENOUS

## 2021-12-31 MED ORDER — METOPROLOL TARTRATE 25 MG PO TABS: 25.0000 mg | ORAL_TABLET | Freq: Two times a day (BID) | ORAL | 3 refills | Status: AC

## 2021-12-31 MED ORDER — ONDANSETRON HCL 4 MG/2ML IJ SOLN
INTRAMUSCULAR | Status: DC | PRN
  Administered 2021-12-31: 4 mg via INTRAVENOUS

## 2021-12-31 MED ORDER — ROCURONIUM BROMIDE 10 MG/ML (PF) SYRINGE
PREFILLED_SYRINGE | INTRAVENOUS | Status: DC | PRN
  Administered 2021-12-31: 20 mg via INTRAVENOUS
  Administered 2021-12-31: 60 mg via INTRAVENOUS

## 2021-12-31 MED ORDER — PHENYLEPHRINE HCL-NACL 20-0.9 MG/250ML-% IV SOLN
INTRAVENOUS | Status: DC | PRN
  Administered 2021-12-31: 20 ug/min via INTRAVENOUS

## 2021-12-31 MED ORDER — FENTANYL CITRATE (PF) 100 MCG/2ML IJ SOLN: 25.0000 ug | INTRAMUSCULAR | Status: DC | PRN

## 2021-12-31 MED ORDER — INSULIN ASPART 100 UNIT/ML IJ SOLN
INTRAMUSCULAR | Status: AC
  Administered 2021-12-31: 3 [IU]
  Filled 2021-12-31: qty 1

## 2021-12-31 MED ORDER — PHENYLEPHRINE 40 MCG/ML (10ML) SYRINGE FOR IV PUSH (FOR BLOOD PRESSURE SUPPORT)
PREFILLED_SYRINGE | INTRAVENOUS | Status: DC | PRN
Start: 2021-12-31 — End: 2021-12-31
  Administered 2021-12-31: 80 ug via INTRAVENOUS

## 2021-12-31 MED ORDER — FENTANYL CITRATE (PF) 250 MCG/5ML IJ SOLN
INTRAMUSCULAR | Status: DC | PRN
  Administered 2021-12-31: 50 ug via INTRAVENOUS

## 2021-12-31 MED ORDER — ACETAMINOPHEN 10 MG/ML IV SOLN
1000.0000 mg | Freq: Once | INTRAVENOUS | Status: DC | PRN
  Filled 2021-12-31: qty 100

## 2021-12-31 MED ORDER — PROPOFOL 10 MG/ML IV BOLUS
INTRAVENOUS | Status: DC | PRN
  Administered 2021-12-31: 120 mg via INTRAVENOUS

## 2021-12-31 MED ORDER — INSULIN ASPART 100 UNIT/ML IJ SOLN
0.0000 [IU] | INTRAMUSCULAR | Status: AC | PRN
  Administered 2021-12-31: 7 [IU] via SUBCUTANEOUS
  Administered 2021-12-31: 14 [IU] via SUBCUTANEOUS
  Filled 2021-12-31: qty 1

## 2021-12-31 MED ORDER — FENTANYL CITRATE (PF) 100 MCG/2ML IJ SOLN
INTRAMUSCULAR | Status: AC
  Filled 2021-12-31: qty 2

## 2021-12-31 MED ORDER — LIDOCAINE 2% (20 MG/ML) 5 ML SYRINGE
INTRAMUSCULAR | Status: DC | PRN
  Administered 2021-12-31: 60 mg via INTRAVENOUS

## 2021-12-31 SURGICAL SUPPLY — 1 items: fiducial ×1 IMPLANT

## 2021-12-31 NOTE — Op Note (Signed)
Video Bronchoscopy with Robotic Assisted Bronchoscopic Navigation  ? ?Date of Operation: 12/31/2021  ? ?Pre-op Diagnosis: Left lower lobe pulmonary nodule ? ?Post-op Diagnosis: Same ? ?Surgeon: Baltazar Apo ? ?Assistants: Walker Shadow ? ?Anesthesia: General endotracheal anesthesia ? ?Operation: Flexible video fiberoptic bronchoscopy with robotic assistance and biopsies. ? ?Estimated Blood Loss: Minimal ? ?Complications: None ? ?Indications and History: ?[name] is a 81 y.o. male with history of non-small cell lung cancer treated with radiation therapy.  He has a new left lower lobe pulmonary nodule seen on surveillance CT and PET scan.  Recommendation made to achieve a tissue diagnosis via navigational bronchoscopy. The risks, benefits, complications, treatment options and expected outcomes were discussed with the patient.  The possibilities of pneumothorax, pneumonia, reaction to medication, pulmonary aspiration, perforation of a viscus, bleeding, failure to diagnose a condition and creating a complication requiring transfusion or operation were discussed with the patient who freely signed the consent.   ? ?Description of Procedure: ?The patient was seen in the Preoperative Area, was examined and was deemed appropriate to proceed.  The patient was taken to [facility] endoscopy room 3, identified as [name] and the procedure verified as Flexible Video Fiberoptic Bronchoscopy.  A Time Out was held and the above information confirmed.  ? ?Prior to the date of the procedure a high-resolution CT scan of the chest was performed. Utilizing ION software program a virtual tracheobronchial tree was generated to allow the creation of distinct navigation pathways to the patient's parenchymal abnormalities. After being taken to the operating room general anesthesia was initiated and the patient  was orally intubated. The video fiberoptic bronchoscope was introduced via the endotracheal tube and a general inspection was  performed which showed normal right lung anatomy. There were some hyperpigmentation and airway narrowing in the superior segment of the left lower lobe without any discrete endobronchial lesion.  Aspiration of the bilateral mainstems was completed to remove any remaining secretions. Robotic catheter inserted into patient's endotracheal tube.  ? ?Target #1 left lower lobe pulmonary nodule: ?The distinct navigation pathways prepared prior to this procedure were then utilized to navigate to patient's lesion identified on CT scan. The robotic catheter was secured into place and the vision probe was withdrawn.  Lesion location was approximated using fluoroscopy and radial endobronchial ultrasound for peripheral targeting.  Local registration and targeting was performed using Cios three-dimensional imaging.  Under fluoroscopic guidance transbronchial needle brushings, transbronchial needle biopsies, and transbronchial forceps biopsies were performed to be sent for cytology and pathology. A bronchioalveolar lavage was performed in the left lower lobe and sent for cytology.  Under fluoroscopic guidance a single fiducial marker was placed adjacent to the left lower lobe nodule ? ? ?At the end of the procedure a general airway inspection was performed and there was no evidence of active bleeding. The bronchoscope was removed.  The patient tolerated the procedure well. There was no significant blood loss and there were no obvious complications. A post-procedural chest x-ray is pending. ? ?Samples Target #1: ?1. Transbronchial needle brushings from left lower lobe nodule ?2. Transbronchial Wang needle biopsies from left lower lobe nodule ?3. Transbronchial forceps biopsies from left lower lobe nodule ?4. Bronchoalveolar lavage from left lower lobe ? ?Plans:  ?The patient will be discharged from the PACU to home when recovered from anesthesia and after chest x-ray is reviewed. We will review the cytology, pathology and  microbiology results with the patient when they become available. Outpatient followup will be with Dr.  Bular Hickok and Dr. Julien Nordmann.  ? ? ?Baltazar Apo, MD, PhD ?12/31/2021, 11:27 AM ?[facility] ?[phone] or if no answer before 7:00PM call [phone] ?For any issues after 7:00PM please call eLink [phone] ? ? ?

## 2021-12-31 NOTE — Discharge Instructions (Signed)
Flexible Bronchoscopy, Care After ?This sheet gives you information about how to care for yourself after your test. Your doctor may also give you more specific instructions. If you have problems or questions, contact your doctor. ?Follow these instructions at home: ?Eating and drinking ?Do not eat or drink anything (not even water) for 2 hours after your test, or until your numbing medicine (local anesthetic) wears off. ?When your numbness is gone and your cough and gag reflexes have come back, you may: ?Eat only soft foods. ?Slowly drink liquids. ?The day after the test, go back to your normal diet. ?Driving ?Do not drive for 24 hours if you were given a medicine to help you relax (sedative). ?Do not drive or use heavy machinery while taking prescription pain medicine. ?General instructions ? ?Take over-the-counter and prescription medicines only as told by your doctor. ?Return to your normal activities as told. Ask what activities are safe for you. ?Do not use any products that have nicotine or tobacco in them. This includes cigarettes and e-cigarettes. If you need help quitting, ask your doctor. ?Keep all follow-up visits as told by your doctor. This is important. It is very important if you had a tissue sample (biopsy) taken. ?Get help right away if: ?You have shortness of breath that gets worse. ?You get light-headed. ?You feel like you are going to pass out (faint). ?You have chest pain. ?You cough up: ?More than a little blood. ?More blood than before. ?Summary ?Do not eat or drink anything (not even water) for 2 hours after your test, or until your numbing medicine wears off. ?Do not use cigarettes. Do not use e-cigarettes. ?Get help right away if you have chest pain. ? ?Please call our office for any questions or concerns.  [phone]. ? ?This information is not intended to replace advice given to you by your health care provider. Make sure you discuss any questions you have with your health care  provider. ?Document Released: 08/11/2009 Document Revised: 09/26/2017 Document Reviewed: 11/01/2016 ?Elsevier Patient Education ? [employer]. ? ?

## 2021-12-31 NOTE — Anesthesia Procedure Notes (Signed)
Procedure Name: Intubation ?Date/Time: 12/31/2021 9:27 AM ?Performed by: Vonna Drafts, CRNA ?Pre-anesthesia Checklist: Patient identified, Emergency Drugs available, Suction available and Patient being monitored ?Patient Re-evaluated:Patient Re-evaluated prior to induction ?Oxygen Delivery Method: Circle system utilized ?Preoxygenation: Pre-oxygenation with 100% oxygen ?Induction Type: IV induction ?Ventilation: Mask ventilation without difficulty and Oral airway inserted - appropriate to patient size ?Laryngoscope Size: Mac and 4 ?Grade View: Grade I ?Tube type: Oral ?Tube size: 8.5 mm ?Number of attempts: 1 ?Airway Equipment and Method: Stylet and Oral airway ?Placement Confirmation: ETT inserted through vocal cords under direct vision, positive ETCO2 and breath sounds checked- equal and bilateral ?Secured at: 21 cm ?Tube secured with: Tape ?Dental Injury: Teeth and Oropharynx as per pre-operative assessment  ? ? ? ? ?

## 2021-12-31 NOTE — Transfer of Care (Signed)
Immediate Anesthesia Transfer of Care Note ? ?Patient: [name] ? ?Procedure(s) Performed: ROBOTIC ASSISTED NAVIGATIONAL BRONCHOSCOPY ?VIDEO BRONCHOSCOPY WITH RADIAL ENDOBRONCHIAL ULTRASOUND ?BRONCHIAL NEEDLE ASPIRATION BIOPSIES ?BRONCHIAL BRUSHINGS ?BRONCHIAL BIOPSIES ?BRONCHIAL WASHINGS ?FIDUCIAL MARKER PLACEMENT ? ?Patient Location: PACU ? ?Anesthesia Type:General ? ?Level of Consciousness: drowsy ? ?Airway & Oxygen Therapy: Patient Spontanous Breathing and Patient connected to face mask oxygen ? ?Post-op Assessment: Report given to RN and Post -op Vital signs reviewed and stable ? ?Post vital signs: Reviewed and stable ? ?Last Vitals:  ?Vitals Value Taken Time  ?BP    ?Temp    ?Pulse 73 12/31/21 1136  ?Resp    ?SpO2 94 % 12/31/21 1136  ?Vitals shown include unvalidated device data. ? ?Last Pain:  ?Vitals:  ? 12/31/21 0746  ?TempSrc:   ?PainSc: 0-No pain  ?   ? ?  ? ?Complications: No notable events documented. ?

## 2021-12-31 NOTE — Telephone Encounter (Signed)
Spoke with wife, DPR on file.   Pt currently at the hospital for a lung biopsy.  Advised wife of recommendations.  She is agreeable to plan and will let pt know.  Wife aware to have pt monitor BP and HR and let us know if any issues.   ?

## 2021-12-31 NOTE — Interval H&P Note (Signed)
History and Physical Interval Note: ? ?12/31/2021 ?7:52 AM ? ?[name]  has presented today for surgery, with the diagnosis of LEFT LOWER LOPE PULMONARY NODULE.  The various methods of treatment have been discussed with the patient and family. After consideration of risks, benefits and other options for treatment, the patient has consented to  Procedure(s): ?ROBOTIC ASSISTED NAVIGATIONAL BRONCHOSCOPY (N/A) as a surgical intervention.  The patient's history has been reviewed, patient examined, no change in status, stable for surgery.  I have reviewed the patient's chart and labs.  Questions were answered to the patient's satisfaction.   ? ? ?Rose Fillers Quanika Solem ? ? ?

## 2021-12-31 NOTE — Progress Notes (Signed)
Notified Dr. Gloris Manchester of pt's CBg 352. Per Dr. Gloris Manchester, give 7 units Novolog SQ per protocol.  ?

## 2021-12-31 NOTE — Progress Notes (Signed)
Dr. Gloris Manchester aware that pt is wearing wedding band. Per pt he broke his ring finger and is unable to remove ring. Knuckle on that finger is visibly swollen. Ok for pt to wear ring per Dr. Gloris Manchester. It will be covered with tape. ?

## 2021-12-31 NOTE — Anesthesia Postprocedure Evaluation (Signed)
Anesthesia Post Note ? ?Patient: [name] ? ?Procedure(s) Performed: ROBOTIC ASSISTED NAVIGATIONAL BRONCHOSCOPY ?VIDEO BRONCHOSCOPY WITH RADIAL ENDOBRONCHIAL ULTRASOUND ?BRONCHIAL NEEDLE ASPIRATION BIOPSIES ?BRONCHIAL BRUSHINGS ?BRONCHIAL BIOPSIES ?BRONCHIAL WASHINGS ?FIDUCIAL MARKER PLACEMENT ? ?  ? ?Patient location during evaluation: PACU ?Anesthesia Type: General ?Level of consciousness: awake and alert ?Pain management: pain level controlled ?Vital Signs Assessment: post-procedure vital signs reviewed and stable ?Respiratory status: spontaneous breathing, nonlabored ventilation, respiratory function stable and patient connected to nasal cannula oxygen ?Cardiovascular status: blood pressure returned to baseline and stable ?Postop Assessment: no apparent nausea or vomiting ?Anesthetic complications: no ? ? ?No notable events documented. ? ?Last Vitals:  ?Vitals:  ? 12/31/21 1235 12/31/21 1250  ?BP: 110/67 110/67  ?Pulse: 65 71  ?Resp: 12 18  ?Temp:  36.8 ?C  ?SpO2: 92% 96%  ?  ?Last Pain:  ?Vitals:  ? 12/31/21 1235  ?TempSrc:   ?PainSc: 0-No pain  ? ? ?  ?  ?  ?  ?  ?  ? ?[name] ? ? ? ? ?

## 2021-12-31 NOTE — Interval H&P Note (Signed)
History and Physical Interval Note: ? ?12/31/2021 ?10:09 AM ? ?RAMAL ECKHARDT  has presented today for surgery, with the diagnosis of LEFT LOWER LOPE PULMONARY NODULE.  The various methods of treatment have been discussed with the patient and family. After consideration of risks, benefits and other options for treatment, the patient has consented to  Procedure(s): ?ROBOTIC ASSISTED NAVIGATIONAL BRONCHOSCOPY (N/A) as a surgical intervention.  The patient's history has been reviewed, patient examined, no change in status, stable for surgery.  I have reviewed the patient's chart and labs.  Questions were answered to the patient's satisfaction.   ? ? ?Rose Fillers Romin Divita ? ? ?

## 2022-01-01 ENCOUNTER — Telehealth: Payer: Self-pay | Admitting: Interventional Cardiology

## 2022-01-01 ENCOUNTER — Encounter (HOSPITAL_COMMUNITY): Payer: Self-pay | Admitting: Emergency Medicine

## 2022-01-01 NOTE — Telephone Encounter (Signed)
Patient states he is returning a call from yesterday. He says he is not sure if it is about a procedure he has coming up. ?

## 2022-01-01 NOTE — Telephone Encounter (Signed)
Wife answered due to pt being on another line with someone.  Advised wife that I had called 2 days ago about pt's meds but then spoke with her yesterday regarding this, so no further changes at this time.  Wife was appreciative for call.  ?

## 2022-01-02 LAB — ACID FAST SMEAR (AFB, MYCOBACTERIA): Acid Fast Smear: NEGATIVE

## 2022-01-03 LAB — CYTOLOGY - NON PAP

## 2022-01-04 ENCOUNTER — Telehealth: Payer: Self-pay | Admitting: Emergency Medicine

## 2022-01-04 NOTE — Telephone Encounter (Signed)
Discussed bronchoscopy biopsy results with the patient by phone today.  The small left lower lobe satellite nodule is consistent with squamous cell lung cancer.  He has follow-up with Dr. Julien Nordmann next week to discuss next steps. ?

## 2022-01-05 LAB — AEROBIC/ANAEROBIC CULTURE W GRAM STAIN (SURGICAL/DEEP WOUND): Culture: NO GROWTH

## 2022-01-07 DIAGNOSIS — M792 Neuralgia and neuritis, unspecified: Secondary | ICD-10-CM | POA: Diagnosis not present

## 2022-01-09 ENCOUNTER — Other Ambulatory Visit: Payer: Self-pay

## 2022-01-09 ENCOUNTER — Telehealth: Payer: Self-pay

## 2022-01-09 ENCOUNTER — Inpatient Hospital Stay: Payer: Medicare Other

## 2022-01-09 ENCOUNTER — Inpatient Hospital Stay: Payer: Medicare Other | Attending: Internal Medicine | Admitting: Internal Medicine

## 2022-01-09 VITALS — BP 109/68 | HR 75 | Temp 96.9°F | Resp 17 | Wt 186.3 lb

## 2022-01-09 DIAGNOSIS — Z79899 Other long term (current) drug therapy: Secondary | ICD-10-CM | POA: Diagnosis not present

## 2022-01-09 DIAGNOSIS — R059 Cough, unspecified: Secondary | ICD-10-CM | POA: Insufficient documentation

## 2022-01-09 DIAGNOSIS — C3490 Malignant neoplasm of unspecified part of unspecified bronchus or lung: Secondary | ICD-10-CM

## 2022-01-09 DIAGNOSIS — G629 Polyneuropathy, unspecified: Secondary | ICD-10-CM | POA: Insufficient documentation

## 2022-01-09 DIAGNOSIS — C3492 Malignant neoplasm of unspecified part of left bronchus or lung: Secondary | ICD-10-CM

## 2022-01-09 DIAGNOSIS — Z923 Personal history of irradiation: Secondary | ICD-10-CM | POA: Diagnosis not present

## 2022-01-09 DIAGNOSIS — R5383 Other fatigue: Secondary | ICD-10-CM | POA: Diagnosis not present

## 2022-01-09 DIAGNOSIS — C3432 Malignant neoplasm of lower lobe, left bronchus or lung: Secondary | ICD-10-CM

## 2022-01-09 LAB — CBC WITH DIFFERENTIAL (CANCER CENTER ONLY)
Abs Immature Granulocytes: 0.26 10*3/uL — ABNORMAL HIGH (ref 0.00–0.07)
Basophils Absolute: 0 10*3/uL (ref 0.0–0.1)
Basophils Relative: 0 %
Eosinophils Absolute: 0 10*3/uL (ref 0.0–0.5)
Eosinophils Relative: 0 %
HCT: 36.5 % — ABNORMAL LOW (ref 39.0–52.0)
Hemoglobin: 12.3 g/dL — ABNORMAL LOW (ref 13.0–17.0)
Immature Granulocytes: 4 %
Lymphocytes Relative: 9 %
Lymphs Abs: 0.5 10*3/uL — ABNORMAL LOW (ref 0.7–4.0)
MCH: 30.6 pg (ref 26.0–34.0)
MCHC: 33.7 g/dL (ref 30.0–36.0)
MCV: 90.8 fL (ref 80.0–100.0)
Monocytes Absolute: 0.4 10*3/uL (ref 0.1–1.0)
Monocytes Relative: 7 %
Neutro Abs: 4.9 10*3/uL (ref 1.7–7.7)
Neutrophils Relative %: 80 %
Platelet Count: 170 10*3/uL (ref 150–400)
RBC: 4.02 MIL/uL — ABNORMAL LOW (ref 4.22–5.81)
RDW: 15.2 % (ref 11.5–15.5)
WBC Count: 6.1 10*3/uL (ref 4.0–10.5)
nRBC: 0 % (ref 0.0–0.2)

## 2022-01-09 LAB — CMP (CANCER CENTER ONLY)
ALT: 32 U/L (ref 0–44)
AST: 22 U/L (ref 15–41)
Albumin: 4.1 g/dL (ref 3.5–5.0)
Alkaline Phosphatase: 72 U/L (ref 38–126)
Anion gap: 8 (ref 5–15)
BUN: 37 mg/dL — ABNORMAL HIGH (ref 8–23)
CO2: 31 mmol/L (ref 22–32)
Calcium: 10.2 mg/dL (ref 8.9–10.3)
Chloride: 95 mmol/L — ABNORMAL LOW (ref 98–111)
Creatinine: 1.49 mg/dL — ABNORMAL HIGH (ref 0.61–1.24)
GFR, Estimated: 47 mL/min — ABNORMAL LOW (ref >60)
Glucose, Bld: 288 mg/dL — ABNORMAL HIGH (ref 70–99)
Potassium: 4.8 mmol/L (ref 3.5–5.1)
Sodium: 134 mmol/L — ABNORMAL LOW (ref 135–145)
Total Bilirubin: 0.5 mg/dL (ref 0.3–1.2)
Total Protein: 7.7 g/dL (ref 6.5–8.1)

## 2022-01-09 NOTE — Progress Notes (Signed)
?Radiation Oncology         [phone] ?________________________________ ? ?Name: [name]        MRN: [id number]  ?Date of Service: 01/10/2022 DOB: 26-Mar-1941 ? ?[id number], Jonny Ruiz, MD  Si Gaul, MD    ? ?REFERRING PHYSICIAN: Si Gaul, MD ? ? 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 ? ? ?HISTORY OF PRESENT ILLNESS: [name] is a 81 y.o. male with a history of Stage IIB, cT3N0M0, NSCLC, squamous cell carcinoma of the LLL. He was treated with ultrahypofractionated radiotherapy to the left lung for his cancer. He was found to have a new nodule during his surveillance imaging on 11/19/21 that showed a 1.5 cm nodule in the LLL. He underwent repeat PET on 12/10/2021 that showed postradiation fibrosis in the left upper and left lower lobe with SUV of 4.1 consistent with prior radiation, there was more focal uptake in the left hilar region that was felt to be indeterminate and a new left lower lobe nodule showing an SUV of 2.6.  There was also subtle ill-defined wispy focus of soft tissue attenuation lateral to the stomach with an SUV of 4.2.  He underwent bronchoscopy on 12/31/2021 which showed malignant cells consistent with squamous cell carcinoma.  He met back with Dr. Arbutus Ped who recommends that the patient consider additional localized therapy with stereotactic radiation.  He is seen today to discuss these options. ? ? ?PREVIOUS RADIATION THERAPY:  ? ?08/06/2021 through 08/17/2021 Ultrahypofractionated Radiotherapy, formerly called SBRT Style Treatment ?Site Technique Total Dose (Gy) Dose per Fx (Gy) Completed Fx Beam Energies  ?Lung, Left: Lung_Lt IMRT 60/60 6 10/10 6XFFF  ? ? ? ?PAST MEDICAL HISTORY:  ?Past Medical History:  ?Diagnosis Date  ? AAA (abdominal aortic aneurysm)   ? a. s/p repair 2009.  ? Acid reflux   ? takes Prilosec and Protonix daily  ? Arthritis   ? BacK  ? CAD in native artery   ? a. a. prior inf MI 1993. b. PCI  to LAD 05/1997. c. recurrent inferolateral MI complicated by V. fib arrest in 04/1998, prior PCI to OM1. d. 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  ? Cancer [facility])   ? basal and squamous cell  ? Chronic combined systolic and diastolic CHF (congestive heart failure) (HCC)   ? Complication of anesthesia   ? had an ileus after a couple of surgeries  ? COVID   ? Depression   ? over 30 years ago  ? Dry skin   ? Encephalitis   ? Foot drop, bilateral   ? Hypercholesteremia   ? Hypertension   ? Ileus (HCC)   ? After AAA  ? Incisional hernia   ? "small, from AAA"  ? Ischemic cardiomyopathy   ? MI (myocardial infarction) (HCC)   ? Neuromuscular disorder (HCC)   ? neuropathy in feet  ? Neuropathy   ? in feet & legs  ? OSA on CPAP   ? uses CPAP--sleep study done at least 7yrs ago  ? PAF (paroxysmal atrial fibrillation) (HCC)   ? Pain   ? back pain chronic- seen at pain clinic  ? Plantar fasciitis   ? bilatetral  ? Pneumonia   ? Thrombocytopenia (HCC)   ? Type II diabetes mellitus (HCC)   ?   ? ? ?PAST SURGICAL HISTORY: ?Past Surgical History:  ?Procedure Laterality Date  ? ABDOMINAL AORTIC ANEURYSM REPAIR    ? BACK SURGERY  2012-2013 X 3  ? Miminal Invasive x 3in [place].  ? BRONCHIAL BIOPSY  07/09/2021  ? Procedure: BRONCHIAL BIOPSIES;  Surgeon: Collene Gobble, MD;  Location: [facility] ENDOSCOPY;  Service: Pulmonary;;  ? BRONCHIAL BIOPSY  12/31/2021  ? Procedure: BRONCHIAL BIOPSIES;  Surgeon: Collene Gobble, MD;  Location: [facility] ENDOSCOPY;  Service: Pulmonary;;  ? BRONCHIAL BRUSHINGS  07/09/2021  ? Procedure: BRONCHIAL BRUSHINGS;  Surgeon: Collene Gobble, MD;  Location: [facility] ENDOSCOPY;  Service: Pulmonary;;  ? BRONCHIAL BRUSHINGS  12/31/2021  ? Procedure: BRONCHIAL BRUSHINGS;  Surgeon: Collene Gobble, MD;  Location: [facility] ENDOSCOPY;  Service: Pulmonary;;  ? BRONCHIAL NEEDLE ASPIRATION BIOPSY  07/09/2021  ? Procedure: BRONCHIAL NEEDLE ASPIRATION BIOPSIES;  Surgeon: Collene Gobble, MD;  Location: [facility] ENDOSCOPY;  Service: Pulmonary;;  ? BRONCHIAL NEEDLE  ASPIRATION BIOPSY  12/31/2021  ? Procedure: BRONCHIAL NEEDLE ASPIRATION BIOPSIES;  Surgeon: Collene Gobble, MD;  Location: [facility] ENDOSCOPY;  Service: Pulmonary;;  ? BRONCHIAL WASHINGS  12/31/2021  ? Procedure: BRONCHIAL WASHINGS;  Surgeon: Collene Gobble, MD;  Location: [facility] ENDOSCOPY;  Service: Pulmonary;;  ? CARDIAC CATHETERIZATION  2009/2012  ? CARDIOVERSION N/A 04/12/2015  ? Procedure: CARDIOVERSION;  Surgeon: Sueanne Margarita, MD;  Location: [place];  Service: Cardiovascular;  Laterality: N/A;  ? CARDIOVERSION N/A 12/26/2016  ? Procedure: CARDIOVERSION;  Surgeon: Sueanne Margarita, MD;  Location: [facility] ENDOSCOPY;  Service: Cardiovascular;  Laterality: N/A;  ? COLONOSCOPY    ? CORONARY ANGIOPLASTY WITH STENT PLACEMENT  1992; 1997  ? FIDUCIAL MARKER PLACEMENT  07/09/2021  ? Procedure: FIDUCIAL MARKER PLACEMENT;  Surgeon: Collene Gobble, MD;  Location: [facility] ENDOSCOPY;  Service: Pulmonary;;  ? FIDUCIAL MARKER PLACEMENT  12/31/2021  ? Procedure: FIDUCIAL MARKER PLACEMENT;  Surgeon: Collene Gobble, MD;  Location: [facility] ENDOSCOPY;  Service: Pulmonary;;  ? LAPAROSCOPIC CHOLECYSTECTOMY    ? LUMBAR LAMINECTOMY/DECOMPRESSION MICRODISCECTOMY Right 01/16/2016  ? Procedure: Right Lumbar five-Sacral one Laminectomy;  Surgeon: Kristeen Miss, MD;  Location: [place] NEURO ORS;  Service: Neurosurgery;  Laterality: Right;  Right L5-S1 Laminectomy  ? VIDEO BRONCHOSCOPY WITH ENDOBRONCHIAL NAVIGATION N/A 07/09/2021  ? Procedure: ROBOTIC VIDEO BRONCHOSCOPY WITH ENDOBRONCHIAL NAVIGATION;  Surgeon: Collene Gobble, MD;  Location: [place] ENDOSCOPY;  Service: Pulmonary;  Laterality: N/A;  ? VIDEO BRONCHOSCOPY WITH RADIAL ENDOBRONCHIAL ULTRASOUND  07/09/2021  ? Procedure: VIDEO BRONCHOSCOPY WITH RADIAL ENDOBRONCHIAL ULTRASOUND;  Surgeon: Collene Gobble, MD;  Location: [place] ENDOSCOPY;  Service: Pulmonary;;  ? VIDEO BRONCHOSCOPY WITH RADIAL ENDOBRONCHIAL ULTRASOUND  12/31/2021  ? Procedure: VIDEO BRONCHOSCOPY WITH RADIAL ENDOBRONCHIAL ULTRASOUND;  Surgeon: Collene Gobble,  MD;  Location: [facility] ENDOSCOPY;  Service: Pulmonary;;  ? ? ? ?FAMILY HISTORY:  ?Family History  ?Problem Relation Age of Onset  ? Diabetes Mother   ? Coronary artery disease Mother   ? Diabetes Father   ? Arthritis Father   ? Rheum arthritis Father   ? Coronary artery disease Father   ? ? ? ?SOCIAL HISTORY:  reports that he quit smoking about 30 years ago. His smoking use included cigarettes. He has a 33.00 pack-year smoking history. He has never used smokeless tobacco. He reports that he does not currently use alcohol. He reports that he does not use drugs. The patient is married and lives in [place]. ? ? ?ALLERGIES: Cymbalta [duloxetine hcl], Neurontin [gabapentin], Atorvastatin, Ciprofloxacin, Keppra [levetiracetam], Pioglitazone, and Simvastatin ? ? ?MEDICATIONS:  ?Current Outpatient Medications  ?Medication Sig Dispense Refill  ? ACCU-CHEK SMARTVIEW test strip CHECK 1 TO 2 TIMES A DAY    ?  albuterol (VENTOLIN HFA) 108 (90 Base) MCG/ACT inhaler 1-2 puff as needed    ? amiodarone (PACERONE) 200 MG tablet Take 1 tablet (200 mg total) by mouth at bedtime. 90 tablet 3  ? amitriptyline (ELAVIL) 10 MG tablet Take 10 mg by mouth at bedtime.    ? amitriptyline (ELAVIL) 50 MG tablet Take 50 mg by mouth at bedtime.    ? atorvastatin (LIPITOR) 10 MG tablet Take 10 mg by mouth in the morning.    ? Cholecalciferol (VITAMIN D3) 50 MCG (2000 UT) TABS Take 2,000 Units by mouth daily.    ? Coenzyme Q10 (COQ10) 200 MG CAPS Take 200 mg by mouth in the morning.    ? Cyanocobalamin (B-12) 5000 MCG CAPS Take 5,000 mcg by mouth daily. 30 capsule 0  ? famotidine (PEPCID) 20 MG tablet Take 20 mg by mouth daily as needed for heartburn or indigestion.    ? finasteride (PROPECIA) 1 MG tablet Take 1 mg by mouth daily.    ? furosemide (LASIX) 40 MG tablet Take 1 tablet (40 mg total) by mouth daily as needed (fluid retention/edema).    ? glimepiride (AMARYL) 2 MG tablet Take 3 tablets (6 mg total) by mouth daily with breakfast. 90 tablet 0  ?  JARDIANCE 25 MG TABS tablet Take 25 mg by mouth every evening.    ? lactose free nutrition (BOOST) LIQD Take 237 mLs by mouth in the morning.    ? Lidocaine HCl 4 % CREA Apply 1 application topically 4 (

## 2022-01-09 NOTE — Progress Notes (Signed)
?    [place] ?[phone]   [phone] ? ?OFFICE PROGRESS NOTE ? ?[name], MD ?[address] ?[place] [address] ? ?DIAGNOSIS: Recurrent non-small cell lung cancer initially diagnosed as stage IIb (T3, N0, M0) non-small cell lung cancer, squamous cell carcinoma presented with large left lower lobe lung mass in addition to suspicious left hilar lymphadenopathy (but is not completely confirmed)  diagnosed in September 2022.  The patient had recurrent disease in the form of left lower lobe lung nodule that biopsy-proven to be squamous cell carcinoma in March 2023 ? ?PRIOR THERAPY: Status post SBRT to the large left lower lobe lung mass under the care of Dr. Lisbeth Renshaw completed August 17, 2021 ? ?CURRENT THERAPY: Observation ? ?INTERVAL HISTORY: ?[name] 81 y.o. male returns to the clinic today for follow-up visit accompanied by his wife.  The patient is feeling fine today with no concerning complaints except for mild fatigue and cough.  He has no chest pain but has shortness of breath with exertion with no hemoptysis.  He denied having any fever or chills.  He has no nausea, vomiting, diarrhea or constipation.  He has no headache or visual changes.  He was seen recently by Dr. Lamonte Sakai and underwent video bronchoscopy with robotic assisted bronchoscopic navigation and biopsy of the left lower lobe lung nodule.  The final pathology was consistent with squamous cell carcinoma of the lung.  He is here for evaluation and discussion of his treatment options. ? ? ?MEDICAL HISTORY: ?Past Medical History:  ?Diagnosis Date  ? AAA (abdominal aortic aneurysm)   ? a. s/p repair 2009.  ? Acid reflux   ? takes Prilosec and Protonix daily  ? Arthritis   ? BacK  ? CAD in native artery   ? a. a. prior inf MI 1993. b. PCI to LAD 05/1997. c. recurrent inferolateral MI complicated by V. fib arrest in 04/1998, prior PCI to OM1. d. 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  ? Cancer [facility])    ? basal and squamous cell  ? Chronic combined systolic and diastolic CHF (congestive heart failure) ([place])   ? Complication of anesthesia   ? had an ileus after a couple of surgeries  ? COVID   ? Depression   ? over 30 years ago  ? Dry skin   ? Encephalitis   ? Foot drop, bilateral   ? Hypercholesteremia   ? Hypertension   ? Ileus ([place])   ? After AAA  ? Incisional hernia   ? "small, from AAA"  ? Ischemic cardiomyopathy   ? MI (myocardial infarction) ([place])   ? Neuromuscular disorder ([place])   ? neuropathy in feet  ? Neuropathy   ? in feet & legs  ? OSA on CPAP   ? uses CPAP--sleep study done at least 17yrs ago  ? PAF (paroxysmal atrial fibrillation) ([place])   ? Pain   ? back pain chronic- seen at pain clinic  ? Plantar fasciitis   ? bilatetral  ? Pneumonia   ? Thrombocytopenia ([place])   ? Type II diabetes mellitus ([place])   ? ? ?ALLERGIES:  is allergic to cymbalta [duloxetine hcl], neurontin [gabapentin], and keppra [levetiracetam]. ? ?MEDICATIONS:  ?Current Outpatient Medications  ?Medication Sig Dispense Refill  ? ACCU-CHEK SMARTVIEW test strip CHECK 1 TO 2 TIMES A DAY    ? albuterol (VENTOLIN HFA) 108 (90 Base) MCG/ACT inhaler 1-2 puff as needed    ? amiodarone (PACERONE)  200 MG tablet Take 1 tablet (200 mg total) by mouth at bedtime. 90 tablet 3  ? amitriptyline (ELAVIL) 10 MG tablet Take 10 mg by mouth at bedtime.    ? amitriptyline (ELAVIL) 50 MG tablet Take 50 mg by mouth at bedtime.    ? atorvastatin (LIPITOR) 10 MG tablet Take 10 mg by mouth in the morning.    ? Cholecalciferol (VITAMIN D3) 50 MCG (2000 UT) TABS Take 2,000 Units by mouth daily.    ? Coenzyme Q10 (COQ10) 200 MG CAPS Take 200 mg by mouth in the morning.    ? Cyanocobalamin (B-12) 5000 MCG CAPS Take 5,000 mcg by mouth daily. 30 capsule 0  ? famotidine (PEPCID) 20 MG tablet Take 20 mg by mouth daily as needed for heartburn or indigestion.    ? finasteride (PROPECIA) 1 MG tablet Take 1 mg by mouth daily.    ? furosemide (LASIX) 40 MG tablet Take 1 tablet  (40 mg total) by mouth daily as needed (fluid retention/edema).    ? glimepiride (AMARYL) 2 MG tablet Take 3 tablets (6 mg total) by mouth daily with breakfast. 90 tablet 0  ? JARDIANCE 25 MG TABS tablet Take 25 mg by mouth every evening.    ? lactose free nutrition (BOOST) LIQD Take 237 mLs by mouth in the morning.    ? Lidocaine HCl 4 % CREA Apply 1 application topically 4 (four) times daily as needed (pain.).    ? metFORMIN (GLUCOPHAGE-XR) 500 MG 24 hr tablet Take 500-1,000 mg by mouth See admin instructions. Take 2 tablets (1000 mg) by mouth in the morning & take 1 tablet (500 mg) by mouth in the evening.    ? metoprolol tartrate (LOPRESSOR) 25 MG tablet Take 1 tablet (25 mg total) by mouth 2 (two) times daily. 180 tablet 3  ? mirtazapine (REMERON) 15 MG tablet Take 1 tablet (15 mg total) by mouth at bedtime. 30 tablet 0  ? Multiple Vitamin (MULTIVITAMIN WITH MINERALS) TABS tablet Take 1 tablet by mouth daily.    ? ondansetron (ZOFRAN ODT) 4 MG disintegrating tablet Take 1 tablet (4 mg total) by mouth every 8 (eight) hours as needed for nausea or vomiting. 20 tablet 0  ? ondansetron (ZOFRAN) 4 MG tablet Take 4 mg by mouth every 6 (six) hours as needed.    ? oxyCODONE-acetaminophen (PERCOCET) 7.5-325 MG tablet Take 1-2 tablets by mouth at bedtime as needed (pain.).    ? OZEMPIC, 0.25 OR 0.5 MG/DOSE, 2 MG/1.5ML SOPN Inject 0.5 mg into the skin every Saturday.    ? PFIZER-BIONT COVID-19 VAC-TRIS SUSP injection     ? predniSONE (STERAPRED UNI-PAK 48 TAB) 5 MG (48) TBPK tablet Take 5 mg by mouth daily.    ? pregabalin (LYRICA) 300 MG capsule Take 300 mg by mouth 2 (two) times daily.    ? rivaroxaban (XARELTO) 20 MG TABS tablet Take 1 tablet (20 mg total) by mouth every evening. OK to restart this medication on 01/01/2022 30 tablet 0  ? senna-docusate (SENOKOT-S) 8.6-50 MG tablet Take 1 tablet by mouth daily as needed (constipation.).    ? ?No current facility-administered medications for this visit.  ? ? ?SURGICAL  HISTORY:  ?Past Surgical History:  ?Procedure Laterality Date  ? ABDOMINAL AORTIC ANEURYSM REPAIR    ? BACK SURGERY  2012-2013 X 3  ? Miminal Invasive x 3in [place].  ? BRONCHIAL BIOPSY  07/09/2021  ? Procedure: BRONCHIAL BIOPSIES;  Surgeon: Collene Gobble, MD;  Location: [place] ENDOSCOPY;  Service: Pulmonary;;  ? BRONCHIAL BIOPSY  12/31/2021  ? Procedure: BRONCHIAL BIOPSIES;  Surgeon: Collene Gobble, MD;  Location: [facility] ENDOSCOPY;  Service: Pulmonary;;  ? BRONCHIAL BRUSHINGS  07/09/2021  ? Procedure: BRONCHIAL BRUSHINGS;  Surgeon: Collene Gobble, MD;  Location: [facility] ENDOSCOPY;  Service: Pulmonary;;  ? BRONCHIAL BRUSHINGS  12/31/2021  ? Procedure: BRONCHIAL BRUSHINGS;  Surgeon: Collene Gobble, MD;  Location: [facility] ENDOSCOPY;  Service: Pulmonary;;  ? BRONCHIAL NEEDLE ASPIRATION BIOPSY  07/09/2021  ? Procedure: BRONCHIAL NEEDLE ASPIRATION BIOPSIES;  Surgeon: Collene Gobble, MD;  Location: [facility] ENDOSCOPY;  Service: Pulmonary;;  ? BRONCHIAL NEEDLE ASPIRATION BIOPSY  12/31/2021  ? Procedure: BRONCHIAL NEEDLE ASPIRATION BIOPSIES;  Surgeon: Collene Gobble, MD;  Location: [facility] ENDOSCOPY;  Service: Pulmonary;;  ? BRONCHIAL WASHINGS  12/31/2021  ? Procedure: BRONCHIAL WASHINGS;  Surgeon: Collene Gobble, MD;  Location: [facility] ENDOSCOPY;  Service: Pulmonary;;  ? CARDIAC CATHETERIZATION  2009/2012  ? CARDIOVERSION N/A 04/12/2015  ? Procedure: CARDIOVERSION;  Surgeon: Sueanne Margarita, MD;  Location: [place];  Service: Cardiovascular;  Laterality: N/A;  ? CARDIOVERSION N/A 12/26/2016  ? Procedure: CARDIOVERSION;  Surgeon: Sueanne Margarita, MD;  Location: [facility] ENDOSCOPY;  Service: Cardiovascular;  Laterality: N/A;  ? COLONOSCOPY    ? CORONARY ANGIOPLASTY WITH STENT PLACEMENT  1992; 1997  ? FIDUCIAL MARKER PLACEMENT  07/09/2021  ? Procedure: FIDUCIAL MARKER PLACEMENT;  Surgeon: Collene Gobble, MD;  Location: [facility] ENDOSCOPY;  Service: Pulmonary;;  ? FIDUCIAL MARKER PLACEMENT  12/31/2021  ? Procedure: FIDUCIAL MARKER PLACEMENT;  Surgeon: Collene Gobble, MD;   Location: [facility] ENDOSCOPY;  Service: Pulmonary;;  ? LAPAROSCOPIC CHOLECYSTECTOMY    ? LUMBAR LAMINECTOMY/DECOMPRESSION MICRODISCECTOMY Right 01/16/2016  ? Procedure: Right Lumbar five-Sacral one Laminectomy;  Su

## 2022-01-09 NOTE — Telephone Encounter (Signed)
Spoke w/ patient, verified identity, and reminded patient of his 1:00pm-01/10/22 in-person appointment w/ Shona Simpson PA-C. I advised patient to arrive 30min early for check-in. I left my extension [phone] in case patient needs anything. Patient verbalized understanding. ? ?

## 2022-01-10 ENCOUNTER — Encounter: Payer: Self-pay | Admitting: *Deleted

## 2022-01-10 ENCOUNTER — Other Ambulatory Visit: Payer: Self-pay | Admitting: *Deleted

## 2022-01-10 ENCOUNTER — Encounter: Payer: Self-pay | Admitting: Radiation Oncology

## 2022-01-10 ENCOUNTER — Ambulatory Visit
Admission: RE | Admit: 2022-01-10 | Discharge: 2022-01-10 | Disposition: A | Discharge status: Home / Self Care | Payer: Medicare Other | Source: Ambulatory Visit | Attending: Radiation Oncology | Admitting: Radiation Oncology

## 2022-01-10 ENCOUNTER — Other Ambulatory Visit: Payer: Medicare Other

## 2022-01-10 ENCOUNTER — Other Ambulatory Visit: Payer: Self-pay

## 2022-01-10 ENCOUNTER — Ambulatory Visit: Payer: Medicare Other | Admitting: Internal Medicine

## 2022-01-10 VITALS — BP 97/59 | HR 77 | Temp 97.8°F | Resp 20 | Ht 72.0 in | Wt 186.5 lb

## 2022-01-10 DIAGNOSIS — C3432 Malignant neoplasm of lower lobe, left bronchus or lung: Secondary | ICD-10-CM

## 2022-01-10 DIAGNOSIS — C3492 Malignant neoplasm of unspecified part of left bronchus or lung: Secondary | ICD-10-CM

## 2022-01-10 DIAGNOSIS — Z08 Encounter for follow-up examination after completed treatment for malignant neoplasm: Secondary | ICD-10-CM | POA: Diagnosis not present

## 2022-01-10 DIAGNOSIS — Z87891 Personal history of nicotine dependence: Secondary | ICD-10-CM | POA: Diagnosis not present

## 2022-01-10 NOTE — Progress Notes (Signed)
Oncology Nurse Navigator Documentation ? ?Oncology Nurse Navigator Flowsheets 01/10/2022 12/17/2021 12/13/2021 07/24/2021  ?Abnormal Finding Date - - - 02/16/2021  ?Confirmed Diagnosis Date - - - 07/09/2021  ?Diagnosis Status - - - Confirmed Diagnosis Complete  ?Planned Course of Treatment - - - Radiation  ?Phase of Treatment - - - Radiation  ?Navigator Follow Up Date: 04/29/2022 01/10/2022 - 07/25/2021  ?Navigator Follow Up Reason: Follow-up Appointment Follow-up After Biopsy - Appointment Review  ?Navigator Location [facility]  ?Referral Date to RadOnc/MedOnc - - - 07/13/2021  ?Navigator Encounter Type Other: Telephone Clinic/MDC Clinic/MDC;Initial MedOnc  ?Telephone - Outgoing Call - -  ?Patient Visit Type Other Other MedOnc;Follow-up MedOnc;Initial  ?Treatment Phase Pre-Tx/Tx Discussion Abnormal Scans Abnormal Scans Pre-Tx/Tx Discussion  ?Barriers/Navigation Needs Coordination of Care Education Coordination of Care Coordination of Care;Education  ?Education - Other - Other;Newly Diagnosed Cancer Education  ?Interventions Coordination of Care/I followed up on Mr. Derks treatment plan schedule. He is set up with rad onc with an appt today. He is set up at this time.  Education;Psycho-Social Support Coordination of Care Coordination of Care;Education;Psycho-Social Support  ?Acuity Level 2-Minimal Needs (1-2 Barriers Identified) Level 2-Minimal Needs (1-2 Barriers Identified) Level 2-Minimal Needs (1-2 Barriers Identified) Level 2-Minimal Needs (1-2 Barriers Identified)  ?Coordination of Care Other - Other Other  ?Education Method - Verbal - Written;Verbal  ?Support Groups/Services - - - Other  ?Time Spent with Patient 30 30 30  45  ?  ?

## 2022-01-10 NOTE — Progress Notes (Signed)
The proposed treatment discussed in cancer conference is for discussion purpose only and is not a binding recommendation. The patient was not physically examined nor present for their treatment options. Therefore, final treatment plans cannot be decided.  ?

## 2022-01-10 NOTE — Progress Notes (Addendum)
Patient here for "Reconsult" appointment w/ Shona Simpson PA-C. I verified patient identity, and began nursing interview w/ spouse Mrs. [name] in attendance. Patient reports muscle tightness in RUQ/ center of chest, upon short inspiration. No other symptoms reported to me at this time. ? ?Meaningful use complete. ? ?BP (!) 97/59 (BP Location: Right Arm, Patient Position: Sitting, Cuff Size: Normal)   Pulse 77   Temp 97.8 ?F (36.6 ?C) (Temporal)   Resp 20   Ht 6' (1.829 m)   Wt 186 lb 8 oz (84.6 kg)   SpO2 98%   BMI 25.29 kg/m?  ? ? ? ? ?

## 2022-01-14 ENCOUNTER — Ambulatory Visit
Admission: RE | Admit: 2022-01-14 | Discharge: 2022-01-14 | Disposition: A | Discharge status: Home / Self Care | Payer: Medicare Other | Source: Ambulatory Visit | Attending: Radiation Oncology | Admitting: Radiation Oncology

## 2022-01-14 ENCOUNTER — Other Ambulatory Visit: Payer: Self-pay

## 2022-01-14 DIAGNOSIS — C3432 Malignant neoplasm of lower lobe, left bronchus or lung: Secondary | ICD-10-CM | POA: Insufficient documentation

## 2022-01-14 DIAGNOSIS — Z87891 Personal history of nicotine dependence: Secondary | ICD-10-CM | POA: Diagnosis not present

## 2022-01-16 ENCOUNTER — Telehealth: Payer: Self-pay | Admitting: Dietician
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
## 2022-01-17 ENCOUNTER — Ambulatory Visit: Payer: Medicare Other | Admitting: Emergency Medicine

## 2022-01-17 ENCOUNTER — Encounter: Payer: Self-pay | Admitting: *Deleted

## 2022-01-17 ENCOUNTER — Encounter: Payer: Self-pay | Admitting: Emergency Medicine

## 2022-01-17 ENCOUNTER — Other Ambulatory Visit: Payer: Self-pay

## 2022-01-17 DIAGNOSIS — C3432 Malignant neoplasm of lower lobe, left bronchus or lung: Secondary | ICD-10-CM | POA: Diagnosis not present

## 2022-01-17 NOTE — Progress Notes (Signed)
Oncology Nurse Navigator Documentation ? ? ?  01/17/2022  ? 11:00 AM 01/10/2022  ? 12:00 PM 12/17/2021  ?  1:00 PM 12/13/2021  ?  9:00 AM 07/24/2021  ?  3:00 PM  ?Oncology Nurse Navigator Flowsheets  ?Abnormal Finding Date     02/16/2021  ?Confirmed Diagnosis Date     07/09/2021  ?Diagnosis Status     Confirmed Diagnosis Complete  ?Planned Course of Treatment     Radiation  ?Phase of Treatment     Radiation  ?Navigator Follow Up Date:  04/29/2022 01/10/2022  07/25/2021  ?Navigator Follow Up Reason:  Follow-up Appointment Follow-up After Biopsy  Appointment Review  ?[facility]igator Location [facility]  ?Referral Date to RadOnc/MedOnc     07/13/2021  ?Navigator Encounter Type Appt/Treatment Plan Review Other: Telephone Clinic/MDC Clinic/MDC;Initial MedOnc  ?Telephone   Outgoing Call    ?Patient Visit Type Other Other Other MedOnc;Follow-up MedOnc;Initial  ?Treatment Phase Pre-Tx/Tx Discussion Pre-Tx/Tx Discussion Abnormal Scans Abnormal Scans Pre-Tx/Tx Discussion  ?Barriers/Navigation Needs Coordination of Care/I followed up on Ms. Fries treatment plan schedule.  He is set up at this time.  Coordination of Care Education Coordination of Care Coordination of Care;Education  ?Education   Other  Other;Newly Diagnosed Cancer Education  ?Interventions Coordination of Care Coordination of Care Education;Psycho-Social Support Coordination of Care Coordination of Care;Education;Psycho-Social Support  ?Acuity Level 2-Minimal Needs (1-2 Barriers Identified) Level 2-Minimal Needs (1-2 Barriers Identified) Level 2-Minimal Needs (1-2 Barriers Identified) Level 2-Minimal Needs (1-2 Barriers Identified) Level 2-Minimal Needs (1-2 Barriers Identified)  ?Coordination of Care Other Other  Other Other  ?Education Method   Verbal  Written;Verbal  ?Support Groups/Services     Other  ?Time Spent with Patient 30 30 30 30  45  ?  ?

## 2022-01-17 NOTE — Assessment & Plan Note (Signed)
New nodule confirmed as squamous cell lung cancer on bronchoscopy.  Planning for another round of SBRT to this region.  Follow-up with radiation oncology, Dr. Julien Nordmann.  Repeat imaging as per their plans.  He can follow-up with me if needed ?

## 2022-01-17 NOTE — Patient Instructions (Signed)
Keep your albuterol available to use 2 puffs if you needed for shortness of breath, chest tightness, wheezing. ?Follow with radiation oncology and oncology as planned. ?Follow with Dr. Lamonte Sakai if needed ?

## 2022-01-17 NOTE — Progress Notes (Signed)
? ?Subjective:  ? ? Patient ID: [name], male    DOB: 1940-11-21, 81 y.o.   MRN: [id number] ? ?HPI ? ?ROV 08/08/21 --[name] is 33 and follows up today for his pulmonary nodular disease.  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  He is currently undergoing SBRT. He is having some cough, dark red old hemoptysis. Having a bit of weakness. Overall doing well.   ? ?ROV 12/20/2021 --81 year old gentleman, former smoker whom I have seen for pulmonary nodular disease originally discovered April 2022.  I biopsied a 3.9 cm left superior segmental nodule that was diagnosed as squamous cell lung cancer 07/09/2021.  He underwent SBRT.  He is doing surveillance with Dr. Julien Nordmann and is referred back today after PET scan 12/10/2021 identified a new rounded 10 x 15 mm left lower lobe pulmonary nodule with some low-level hypermetabolism as below.  ?He is coughing daily, tan color, no blood. He has some difficulty getting around, used a walker.  ? ?PET scan 12/10/2021 reviewed by me shows postradiation change in the left upper lobe and left lower lobe with a decrease in size of the superior segmental mass.  There is some SUV uptake in the postradiation fibrotic change.  There is a new left lower lobe pulmonary nodule that is rounded, spiculated with an SUV max of 2.6.  ? ? ?ROV 01/17/22 --[name] is 52, former smoker who follows up today following bronchoscopy.  He had a left lower lobe squamous cell lung cancer diagnosed in September 2022 treated with SBRT.  Most recent biopsies were on a 10 x 15 mm left lower lobe pulmonary nodule.  This was again consistent with squamous cell lung cancer. ?He reports that he is preparing for another SBRT to this lesion. Feeling well. He does have some cough, brownish / tan mucous. Minimal nasal drainage. No fever.  ?Albuterol available but has not needed.  ? ? ?Review of  Systems ?As per HPI ? ?Past Medical History:  ?Diagnosis Date  ? AAA (abdominal aortic aneurysm)   ? a. s/p repair 2009.  ? Acid reflux   ? takes Prilosec and Protonix daily  ? Arthritis   ? BacK  ? CAD in native artery   ? a. a. prior inf MI 1993. b. PCI to LAD 05/1997. c. recurrent inferolateral MI complicated by V. fib arrest in 04/1998, prior PCI to OM1. d. 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  ? Cancer [facility])   ? basal and squamous cell  ? Chronic combined systolic and diastolic CHF (congestive heart failure) ([place])   ? Complication of anesthesia   ? had an ileus after a couple of surgeries  ? COVID   ? Depression   ? over 30 years ago  ? Dry skin   ? Encephalitis   ? Foot drop, bilateral   ? Hypercholesteremia   ? Hypertension   ? Ileus ([place])   ? After AAA  ? Incisional hernia   ? "small, from AAA"  ? Ischemic cardiomyopathy   ? MI (myocardial infarction) ([place])   ? Neuromuscular disorder ([place])   ? neuropathy in feet  ? Neuropathy   ? in feet & legs  ? OSA on CPAP   ? uses CPAP--sleep study done at least 64yrs ago  ? PAF (paroxysmal atrial fibrillation) ([place])   ? Pain   ? back pain chronic- seen  at pain clinic  ? Plantar fasciitis   ? bilatetral  ? Pneumonia   ? Thrombocytopenia ([place])   ? Type II diabetes mellitus ([place])   ?  ? ?Family History  ?Problem Relation Age of Onset  ? Diabetes Mother   ? Coronary artery disease Mother   ? Diabetes Father   ? Arthritis Father   ? Rheum arthritis Father   ? Coronary artery disease Father   ?  ? ?Social History  ? ?Socioeconomic History  ? Marital status: Married  ?  Spouse name: Not on file  ? Number of children: Not on file  ? Years of education: Not on file  ? Highest education level: Not on file  ?Occupational History  ? Not on file  ?Tobacco Use  ? Smoking status: Former  ?  Packs/day: 1.00  ?  Years: 33.00  ?  Pack years: 33.00  ?  Types: Cigarettes  ?  Quit date: 01/01/1992  ?  Years since quitting: 30.0  ? Smokeless tobacco: Never  ?Vaping Use  ? Vaping Use: Never used   ?Substance and Sexual Activity  ? Alcohol use: Not Currently  ?  Comment: rare  ? Drug use: No  ? Sexual activity: Yes  ?Other Topics Concern  ? Not on file  ?Social History Narrative  ? Not on file  ? ?Social Determinants of Health  ? ?Financial Resource Strain: Not on file  ?Food Insecurity: Not on file  ?Transportation Needs: Not on file  ?Physical Activity: Not on file  ?Stress: Not on file  ?Social Connections: Not on file  ?Intimate Partner Violence: Not on file  ?  ?[place], in [place] ?Has lived in [place] ?No military ?Worked in [employer].  ?No known asbestos exposure.  ? ?Allergies  ?Allergen Reactions  ? Cymbalta [Duloxetine Hcl] Nausea And Vomiting and Other (See Comments)  ?  PATIENT STATED THAT HE FELT LIKE HE "WAS GOING TO DIE" Rapid drop in blood pressure; sent him to the ER X2  ? Neurontin [Gabapentin] Other (See Comments)  ?  "Makes me goofy, keeps me off balance, clouds my thinking.."  ? Atorvastatin Other (See Comments)  ? Ciprofloxacin Nausea And Vomiting  ? Keppra [Levetiracetam] Nausea And Vomiting  ? Pioglitazone Other (See Comments)  ? Simvastatin Other (See Comments)  ?  ? ?Outpatient Medications Prior to Visit  ?Medication Sig Dispense Refill  ? ACCU-CHEK SMARTVIEW test strip CHECK 1 TO 2 TIMES A DAY    ? albuterol (VENTOLIN HFA) 108 (90 Base) MCG/ACT inhaler 1-2 puff as needed    ? amiodarone (PACERONE) 200 MG tablet Take 1 tablet (200 mg total) by mouth at bedtime. 90 tablet 3  ? amitriptyline (ELAVIL) 10 MG tablet Take 10 mg by mouth at bedtime.    ? amitriptyline (ELAVIL) 50 MG tablet Take 50 mg by mouth at bedtime.    ? atorvastatin (LIPITOR) 10 MG tablet Take 10 mg by mouth in the morning.    ? Cholecalciferol (VITAMIN D3) 50 MCG (2000 UT) TABS Take 2,000 Units by mouth daily.    ? Coenzyme Q10 (COQ10) 200 MG CAPS Take 200 mg by mouth in the morning.    ? Cyanocobalamin (B-12) 5000 MCG CAPS Take 5,000 mcg by mouth daily. 30 capsule 0  ? famotidine (PEPCID) 20 MG  tablet Take 20 mg by mouth daily as needed for heartburn or indigestion.    ? finasteride (PROPECIA) 1 MG tablet Take 1 mg by  mouth daily.    ? furosemide (LASIX) 40 MG tablet Take 1 tablet (40 mg total) by mouth daily as needed (fluid retention/edema).    ? glimepiride (AMARYL) 2 MG tablet Take 3 tablets (6 mg total) by mouth daily with breakfast. 90 tablet 0  ? JARDIANCE 25 MG TABS tablet Take 25 mg by mouth every evening.    ? lactose free nutrition (BOOST) LIQD Take 237 mLs by mouth in the morning.    ? Lidocaine HCl 4 % CREA Apply 1 application topically 4 (four) times daily as needed (pain.).    ? metFORMIN (GLUCOPHAGE-XR) 500 MG 24 hr tablet Take 500-1,000 mg by mouth See admin instructions. Take 2 tablets (1000 mg) by mouth in the morning & take 1 tablet (500 mg) by mouth in the evening.    ? metoprolol tartrate (LOPRESSOR) 25 MG tablet Take 1 tablet (25 mg total) by mouth 2 (two) times daily. 180 tablet 3  ? mirtazapine (REMERON) 15 MG tablet Take 1 tablet (15 mg total) by mouth at bedtime. 30 tablet 0  ? Multiple Vitamin (MULTIVITAMIN WITH MINERALS) TABS tablet Take 1 tablet by mouth daily.    ? ondansetron (ZOFRAN ODT) 4 MG disintegrating tablet Take 1 tablet (4 mg total) by mouth every 8 (eight) hours as needed for nausea or vomiting. 20 tablet 0  ? ondansetron (ZOFRAN) 4 MG tablet Take 4 mg by mouth every 6 (six) hours as needed.    ? oxyCODONE-acetaminophen (PERCOCET) 7.5-325 MG tablet Take 1-2 tablets by mouth at bedtime as needed (pain.).    ? OZEMPIC, 0.25 OR 0.5 MG/DOSE, 2 MG/1.5ML SOPN Inject 0.5 mg into the skin every Saturday.    ? pregabalin (LYRICA) 300 MG capsule Take 300 mg by mouth 2 (two) times daily.    ? rivaroxaban (XARELTO) 20 MG TABS tablet Take 1 tablet (20 mg total) by mouth every evening. OK to restart this medication on 01/01/2022 30 tablet 0  ? senna-docusate (SENOKOT-S) 8.6-50 MG tablet Take 1 tablet by mouth daily as needed (constipation.).    ? ?No facility-administered  medications prior to visit.  ? ? ? ? ?   ?Objective:  ? Physical Exam ?Vitals:  ? 01/17/22 1427  ?BP: 132/76  ?Pulse: 76  ?Temp: 98.5 ?F (36.9 ?C)  ?TempSrc: Oral  ?SpO2: 97%  ?Weight: 195 lb 3.2 oz (88.5 kg)  ?Height: 6

## 2022-01-21 DIAGNOSIS — I10 Essential (primary) hypertension: Secondary | ICD-10-CM | POA: Diagnosis not present

## 2022-01-21 DIAGNOSIS — E0590 Thyrotoxicosis, unspecified without thyrotoxic crisis or storm: Secondary | ICD-10-CM | POA: Diagnosis not present

## 2022-01-21 DIAGNOSIS — G629 Polyneuropathy, unspecified: Secondary | ICD-10-CM | POA: Diagnosis not present

## 2022-01-21 DIAGNOSIS — I480 Paroxysmal atrial fibrillation: Secondary | ICD-10-CM | POA: Diagnosis not present

## 2022-01-21 DIAGNOSIS — I25119 Atherosclerotic heart disease of native coronary artery with unspecified angina pectoris: Secondary | ICD-10-CM | POA: Diagnosis not present

## 2022-01-21 DIAGNOSIS — E1140 Type 2 diabetes mellitus with diabetic neuropathy, unspecified: Secondary | ICD-10-CM | POA: Diagnosis not present

## 2022-01-21 DIAGNOSIS — C3492 Malignant neoplasm of unspecified part of left bronchus or lung: Secondary | ICD-10-CM | POA: Diagnosis not present

## 2022-01-22 DIAGNOSIS — C3432 Malignant neoplasm of lower lobe, left bronchus or lung: Secondary | ICD-10-CM | POA: Diagnosis not present

## 2022-01-22 DIAGNOSIS — Z87891 Personal history of nicotine dependence: Secondary | ICD-10-CM | POA: Diagnosis not present

## 2022-01-23 ENCOUNTER — Other Ambulatory Visit: Payer: Self-pay

## 2022-01-23 ENCOUNTER — Ambulatory Visit
Admission: RE | Admit: 2022-01-23 | Discharge: 2022-01-23 | Disposition: A | Discharge status: Home / Self Care | Payer: Medicare Other | Source: Ambulatory Visit | Attending: Radiation Oncology | Admitting: Radiation Oncology

## 2022-01-23 DIAGNOSIS — C3432 Malignant neoplasm of lower lobe, left bronchus or lung: Secondary | ICD-10-CM | POA: Diagnosis not present

## 2022-01-24 ENCOUNTER — Ambulatory Visit: Payer: Medicare Other | Admitting: Radiation Oncology

## 2022-01-24 DIAGNOSIS — G629 Polyneuropathy, unspecified: Secondary | ICD-10-CM | POA: Diagnosis not present

## 2022-01-24 DIAGNOSIS — E1140 Type 2 diabetes mellitus with diabetic neuropathy, unspecified: Secondary | ICD-10-CM | POA: Diagnosis not present

## 2022-01-24 DIAGNOSIS — I10 Essential (primary) hypertension: Secondary | ICD-10-CM | POA: Diagnosis not present

## 2022-01-25 ENCOUNTER — Other Ambulatory Visit: Payer: Self-pay

## 2022-01-25 ENCOUNTER — Ambulatory Visit
Admission: RE | Admit: 2022-01-25 | Discharge: 2022-01-25 | Disposition: A | Discharge status: Home / Self Care | Payer: Medicare Other | Source: Ambulatory Visit | Attending: Radiation Oncology | Admitting: Radiation Oncology

## 2022-01-25 DIAGNOSIS — C3432 Malignant neoplasm of lower lobe, left bronchus or lung: Secondary | ICD-10-CM | POA: Diagnosis not present

## 2022-01-26 DIAGNOSIS — S065X9A Traumatic subdural hemorrhage with loss of consciousness of unspecified duration, initial encounter: Secondary | ICD-10-CM | POA: Diagnosis not present

## 2022-01-29 ENCOUNTER — Ambulatory Visit: Payer: Medicare Other | Admitting: Radiation Oncology

## 2022-01-29 LAB — FUNGUS CULTURE WITH STAIN

## 2022-01-29 LAB — FUNGAL ORGANISM REFLEX

## 2022-01-29 LAB — FUNGUS CULTURE RESULT

## 2022-01-30 ENCOUNTER — Other Ambulatory Visit: Payer: Self-pay

## 2022-01-30 ENCOUNTER — Ambulatory Visit
Admission: RE | Admit: 2022-01-30 | Discharge: 2022-01-30 | Disposition: A | Discharge status: Home / Self Care | Payer: Medicare Other | Source: Ambulatory Visit | Attending: Radiation Oncology | Admitting: Radiation Oncology

## 2022-01-30 ENCOUNTER — Ambulatory Visit: Payer: Medicare Other | Admitting: Dietician

## 2022-01-30 ENCOUNTER — Encounter: Payer: Self-pay | Admitting: Radiation Oncology

## 2022-01-30 VITALS — BP 100/56 | HR 77 | Temp 97.8°F | Resp 16

## 2022-01-30 DIAGNOSIS — H538 Other visual disturbances: Secondary | ICD-10-CM | POA: Diagnosis not present

## 2022-01-30 DIAGNOSIS — Z510 Encounter for antineoplastic radiation therapy: Secondary | ICD-10-CM | POA: Diagnosis not present

## 2022-01-30 DIAGNOSIS — C3432 Malignant neoplasm of lower lobe, left bronchus or lung: Secondary | ICD-10-CM | POA: Insufficient documentation

## 2022-01-30 DIAGNOSIS — Z87891 Personal history of nicotine dependence: Secondary | ICD-10-CM | POA: Diagnosis not present

## 2022-01-30 NOTE — Progress Notes (Signed)
Attempted to reach patient to follow up on MST screen for weight loss.  Left message on voice mail ?

## 2022-01-30 NOTE — Progress Notes (Signed)
Patient was seen in the nursing clinic following his final radiation treatment to his Left Lung.  Denies SOB, cough, skin irritation or difficulty with swallowing.  He was seen by the PA, see separate note. ? ? ?BP (!) 100/56   Pulse 77   Temp 97.8 ?F (36.6 ?C)   Resp 16   SpO2 100%   ? ?Gloriajean Dell. Quincy Simmonds RN, BSN  ? ? ?
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
## 2022-01-31 ENCOUNTER — Telehealth: Payer: Self-pay | Admitting: *Deleted

## 2022-01-31 NOTE — Telephone Encounter (Signed)
CALLED PATIENT TO INFORM OF APPT. WITH DR. Gillian Scarce OF [facility] ON 02-07-22- ARRIVAL TIME- 8:15 AM - ADDRESS- [address] PHONE NUMBER IS [phone], LVM FOR A RETURN CALL ?

## 2022-02-07 DIAGNOSIS — E1140 Type 2 diabetes mellitus with diabetic neuropathy, unspecified: Secondary | ICD-10-CM | POA: Diagnosis not present

## 2022-02-07 DIAGNOSIS — I480 Paroxysmal atrial fibrillation: Secondary | ICD-10-CM | POA: Diagnosis not present

## 2022-02-07 DIAGNOSIS — H5000 Unspecified esotropia: Secondary | ICD-10-CM | POA: Diagnosis not present

## 2022-02-07 DIAGNOSIS — E039 Hypothyroidism, unspecified: Secondary | ICD-10-CM | POA: Diagnosis not present

## 2022-02-07 DIAGNOSIS — E7800 Pure hypercholesterolemia, unspecified: Secondary | ICD-10-CM | POA: Diagnosis not present

## 2022-02-07 DIAGNOSIS — I5042 Chronic combined systolic (congestive) and diastolic (congestive) heart failure: Secondary | ICD-10-CM | POA: Diagnosis not present

## 2022-02-07 DIAGNOSIS — I10 Essential (primary) hypertension: Secondary | ICD-10-CM | POA: Diagnosis not present

## 2022-02-07 DIAGNOSIS — K219 Gastro-esophageal reflux disease without esophagitis: Secondary | ICD-10-CM | POA: Diagnosis not present

## 2022-02-07 DIAGNOSIS — I25119 Atherosclerotic heart disease of native coronary artery with unspecified angina pectoris: Secondary | ICD-10-CM | POA: Diagnosis not present

## 2022-02-13 DIAGNOSIS — F5101 Primary insomnia: Secondary | ICD-10-CM | POA: Diagnosis not present

## 2022-02-15 LAB — ACID FAST CULTURE WITH REFLEXED SENSITIVITIES (MYCOBACTERIA): Acid Fast Culture: NEGATIVE

## 2022-02-20 DIAGNOSIS — E039 Hypothyroidism, unspecified: Secondary | ICD-10-CM | POA: Diagnosis not present

## 2022-02-21 DIAGNOSIS — E119 Type 2 diabetes mellitus without complications: Secondary | ICD-10-CM | POA: Diagnosis not present

## 2022-02-23 IMAGING — DX DG TOE GREAT 2+V*R*
3 series · 3 of 3 positions shown · non-contrast
Comparison: None.

CLINICAL DATA: Post reduction first IP joint dislocation

EXAM:
RIGHT FIRST TOE: 3 V

[t-spine lat]
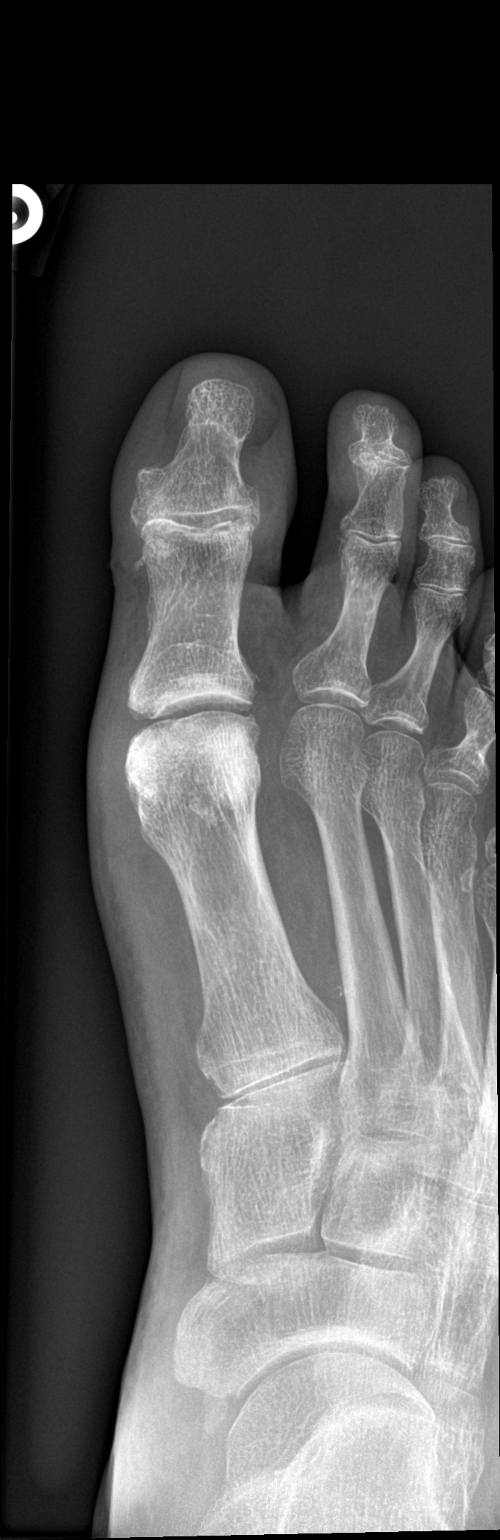

[t-spine obl]
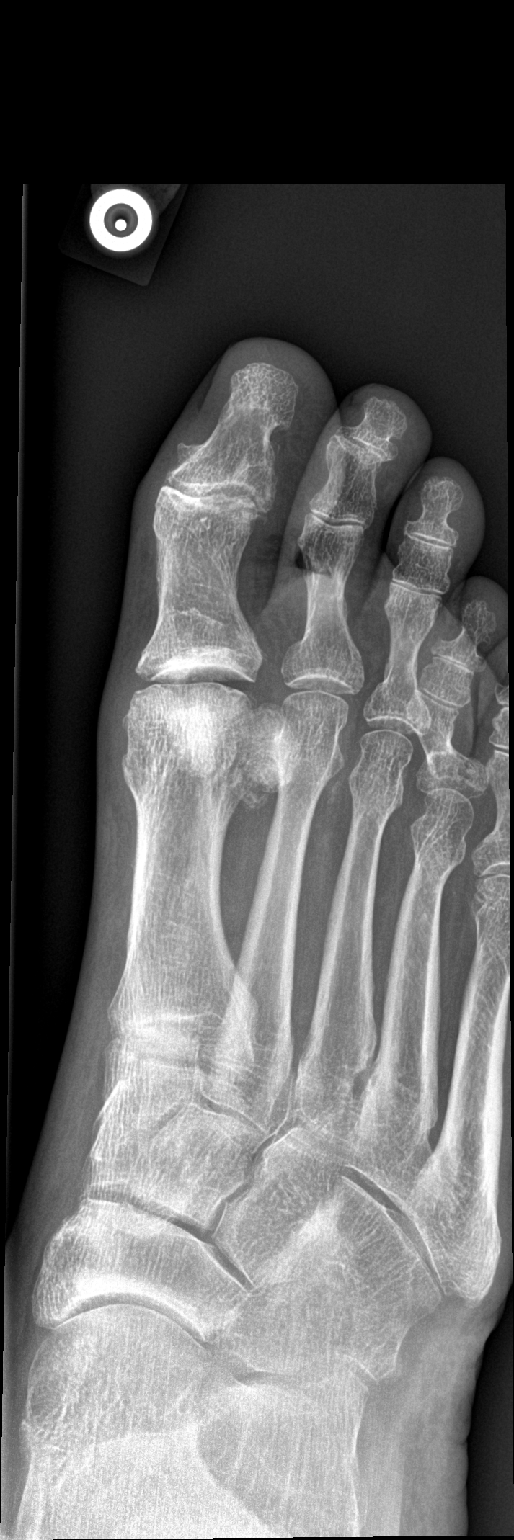

[t-spine ext]
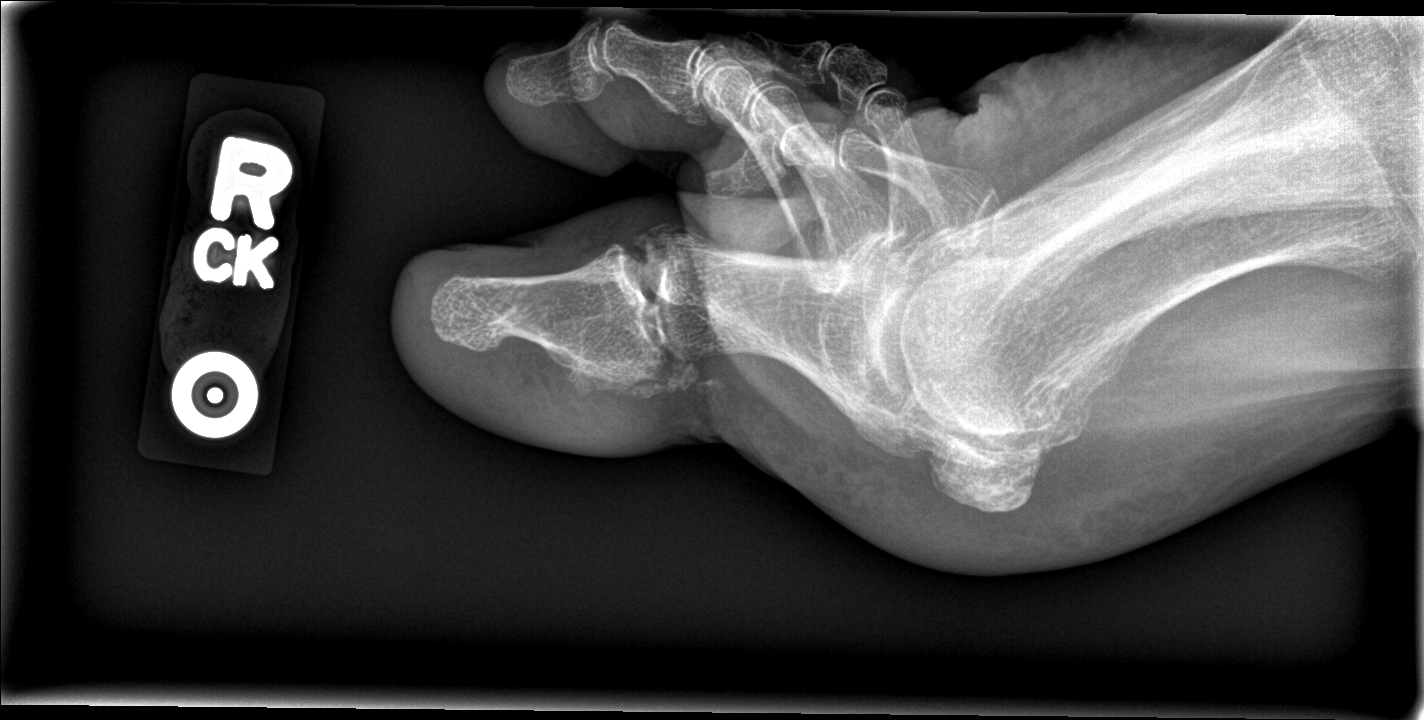

[3 of 3 positions shown; findings below may reference images not displayed]

FINDINGS: Frontal, oblique, and lateral views were obtained. There has been
interval reduction IP joint dislocation of the first digit. There is
currently no evident dislocation. There are foci of calcification
along the volar aspect of the joint. No acute fracture is
demonstrable. There is narrowing of the first IP and MTP joints. No
erosion.
IMPRESSION: Interval reduction of first IP joint dislocation. Several
intra-articular region calcifications are noted. An acute fracture
is not appreciable on current examination. There is narrowing of the
first MTP and IP joints.

## 2022-02-26 ENCOUNTER — Telehealth: Payer: Self-pay | Admitting: *Deleted

## 2022-02-26 NOTE — Telephone Encounter (Signed)
CALLED PATIENT TO INFORM OF MRI FOR 03-01-22- ARRIVAL TIME- 6:30 PM @ [facility], NO RESTRICTIONS TO TEST, SPOKE WITH PATIENT AND HE IS AWARE OF THIS MRI ?

## 2022-03-01 ENCOUNTER — Ambulatory Visit (HOSPITAL_COMMUNITY)
Admission: RE | Admit: 2022-03-01 | Discharge: 2022-03-01 | Disposition: A | Discharge status: Home / Self Care | Payer: Medicare Other | Source: Ambulatory Visit | Attending: Radiation Oncology | Admitting: Radiation Oncology

## 2022-03-01 DIAGNOSIS — C3432 Malignant neoplasm of lower lobe, left bronchus or lung: Secondary | ICD-10-CM

## 2022-03-01 DIAGNOSIS — H538 Other visual disturbances: Secondary | ICD-10-CM | POA: Diagnosis not present

## 2022-03-01 DIAGNOSIS — H532 Diplopia: Secondary | ICD-10-CM | POA: Diagnosis not present

## 2022-03-01 MED ORDER — GADOBUTROL 1 MMOL/ML IV SOLN
8.0000 mL | Freq: Once | INTRAVENOUS | Status: AC | PRN
  Administered 2022-03-01: 8 mL via INTRAVENOUS

## 2022-03-04 ENCOUNTER — Other Ambulatory Visit: Payer: Self-pay | Admitting: Radiation Therapy

## 2022-03-04 ENCOUNTER — Telehealth: Payer: Self-pay | Admitting: Radiation Oncology

## 2022-03-04 DIAGNOSIS — C7931 Secondary malignant neoplasm of brain: Secondary | ICD-10-CM

## 2022-03-04 NOTE — Telephone Encounter (Signed)
I called the patient to let him know our discussion in brain oncology conference was to proceed with a repeat, 3T MRI to follow the punctate features in the right side of the brain in about a month. He reports he was told he had an eye exam last week with his optometrist Dr. Idolina Primer and was told he had cataracts. He reports he saw Dr. Josephina Shih in ophthalmology and he is going to consider cataract removal and lens replacement. It wasn't clear if Dr. Idolina Primer thought his symptoms were the result of cataracts however. We discussed the recommendation from brain oncology conference is to have repeat MRI in about a month and he is in agreement. We will ask for Dr. Mickeal Skinner to see him as well to ensure he doesn't need to be followed if these are vascular changes on MRI.  ?

## 2022-03-04 NOTE — Telephone Encounter (Signed)
I called and had to leave a message for the patient and his wife to call back so we can review MRI results.  ?

## 2022-03-05 ENCOUNTER — Telehealth: Payer: Self-pay | Admitting: Radiation Oncology

## 2022-03-05 DIAGNOSIS — G629 Polyneuropathy, unspecified: Secondary | ICD-10-CM | POA: Diagnosis not present
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
## 2022-03-06 ENCOUNTER — Telehealth: Payer: Self-pay | Admitting: Radiation Therapy

## 2022-03-06 NOTE — Telephone Encounter (Signed)
Left a detailed message about the recommended brain MRI in early June, followed by a visit with Dr. Mickeal Skinner to review those results. I included my contact information in case he has questions about the appointments scheduled.  ? ?Mont Dutton R.T.(R)(T) ?Radiation Special Procedures Navigator  ?

## 2022-03-07 DIAGNOSIS — I480 Paroxysmal atrial fibrillation: Secondary | ICD-10-CM | POA: Diagnosis not present

## 2022-03-07 DIAGNOSIS — E1151 Type 2 diabetes mellitus with diabetic peripheral angiopathy without gangrene: Secondary | ICD-10-CM | POA: Diagnosis not present

## 2022-03-07 DIAGNOSIS — F5101 Primary insomnia: Secondary | ICD-10-CM | POA: Diagnosis not present

## 2022-03-07 DIAGNOSIS — E1142 Type 2 diabetes mellitus with diabetic polyneuropathy: Secondary | ICD-10-CM | POA: Diagnosis not present

## 2022-03-07 DIAGNOSIS — G8929 Other chronic pain: Secondary | ICD-10-CM | POA: Diagnosis not present

## 2022-03-11 ENCOUNTER — Ambulatory Visit: Admission: RE | Admit: 2022-03-11 | Payer: Medicare Other | Source: Ambulatory Visit

## 2022-03-11 DIAGNOSIS — F5101 Primary insomnia: Secondary | ICD-10-CM | POA: Diagnosis not present

## 2022-03-11 DIAGNOSIS — E1151 Type 2 diabetes mellitus with diabetic peripheral angiopathy without gangrene: Secondary | ICD-10-CM | POA: Diagnosis not present

## 2022-03-11 DIAGNOSIS — I480 Paroxysmal atrial fibrillation: Secondary | ICD-10-CM | POA: Diagnosis not present

## 2022-03-11 DIAGNOSIS — E1142 Type 2 diabetes mellitus with diabetic polyneuropathy: Secondary | ICD-10-CM | POA: Diagnosis not present

## 2022-03-11 DIAGNOSIS — G8929 Other chronic pain: Secondary | ICD-10-CM | POA: Diagnosis not present

## 2022-03-11 IMAGING — DX DG TOE 2ND 2+V*R*
3 series · 3 of 3 positions shown · non-contrast
Comparison: None.

CLINICAL DATA: Re-injury second toe.  Bleeding.

EXAM:
RIGHT SECOND TOE

[toe ap]
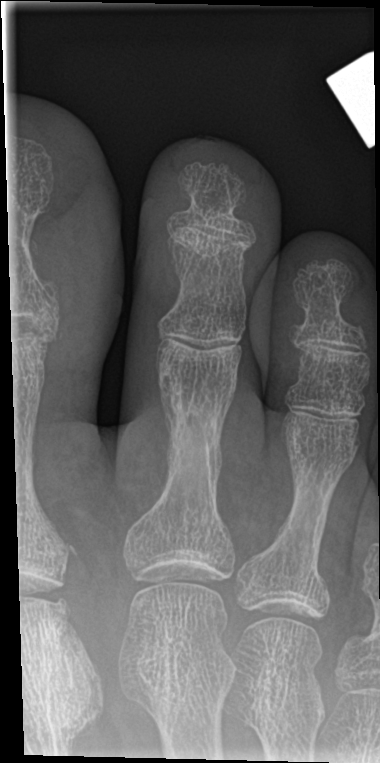

[toe obl]
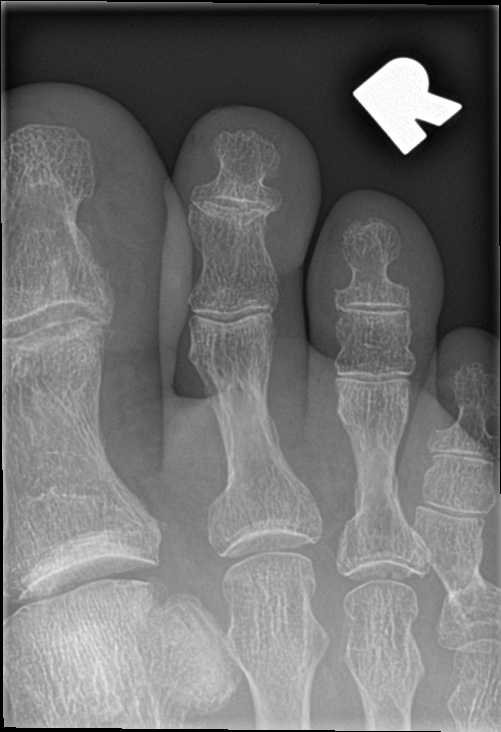

[toe lat]
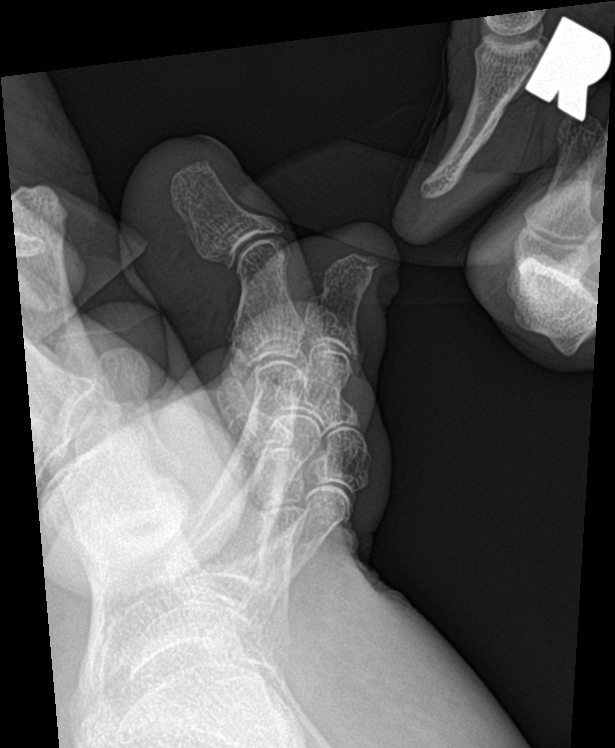

[3 of 3 positions shown; findings below may reference images not displayed]

FINDINGS: Soft tissue swelling is present in the distal digit. Joints are
located. No acute or healing fractures are present. Tissues are
otherwise unremarkable.
IMPRESSION: Soft tissue swelling of the distal digit without underlying
fracture.

## 2022-03-12 DIAGNOSIS — I480 Paroxysmal atrial fibrillation: Secondary | ICD-10-CM | POA: Diagnosis not present

## 2022-03-12 DIAGNOSIS — G8929 Other chronic pain: Secondary | ICD-10-CM | POA: Diagnosis not present

## 2022-03-12 DIAGNOSIS — E1142 Type 2 diabetes mellitus with diabetic polyneuropathy: Secondary | ICD-10-CM | POA: Diagnosis not present

## 2022-03-12 DIAGNOSIS — E1151 Type 2 diabetes mellitus with diabetic peripheral angiopathy without gangrene: Secondary | ICD-10-CM | POA: Diagnosis not present

## 2022-03-12 DIAGNOSIS — F5101 Primary insomnia: Secondary | ICD-10-CM | POA: Diagnosis not present

## 2022-03-13 DIAGNOSIS — F5101 Primary insomnia: Secondary | ICD-10-CM | POA: Diagnosis not present

## 2022-03-13 DIAGNOSIS — I480 Paroxysmal atrial fibrillation: Secondary | ICD-10-CM | POA: Diagnosis not present

## 2022-03-13 DIAGNOSIS — E1151 Type 2 diabetes mellitus with diabetic peripheral angiopathy without gangrene: Secondary | ICD-10-CM | POA: Diagnosis not present

## 2022-03-13 DIAGNOSIS — G8929 Other chronic pain: Secondary | ICD-10-CM | POA: Diagnosis not present

## 2022-03-13 DIAGNOSIS — E1142 Type 2 diabetes mellitus with diabetic polyneuropathy: Secondary | ICD-10-CM | POA: Diagnosis not present

## 2022-03-13 IMAGING — DX DG FOOT COMPLETE 3+V*R*
3 series · 3 of 3 positions shown · non-contrast
Comparison: Radiographs 08/04/2020 and 07/19/2020.

CLINICAL DATA: Worsening toe and foot infection. Failing
antibiotics. Diabetes.

EXAM:
RIGHT FOOT COMPLETE - 3+ VIEW

[foot ap]
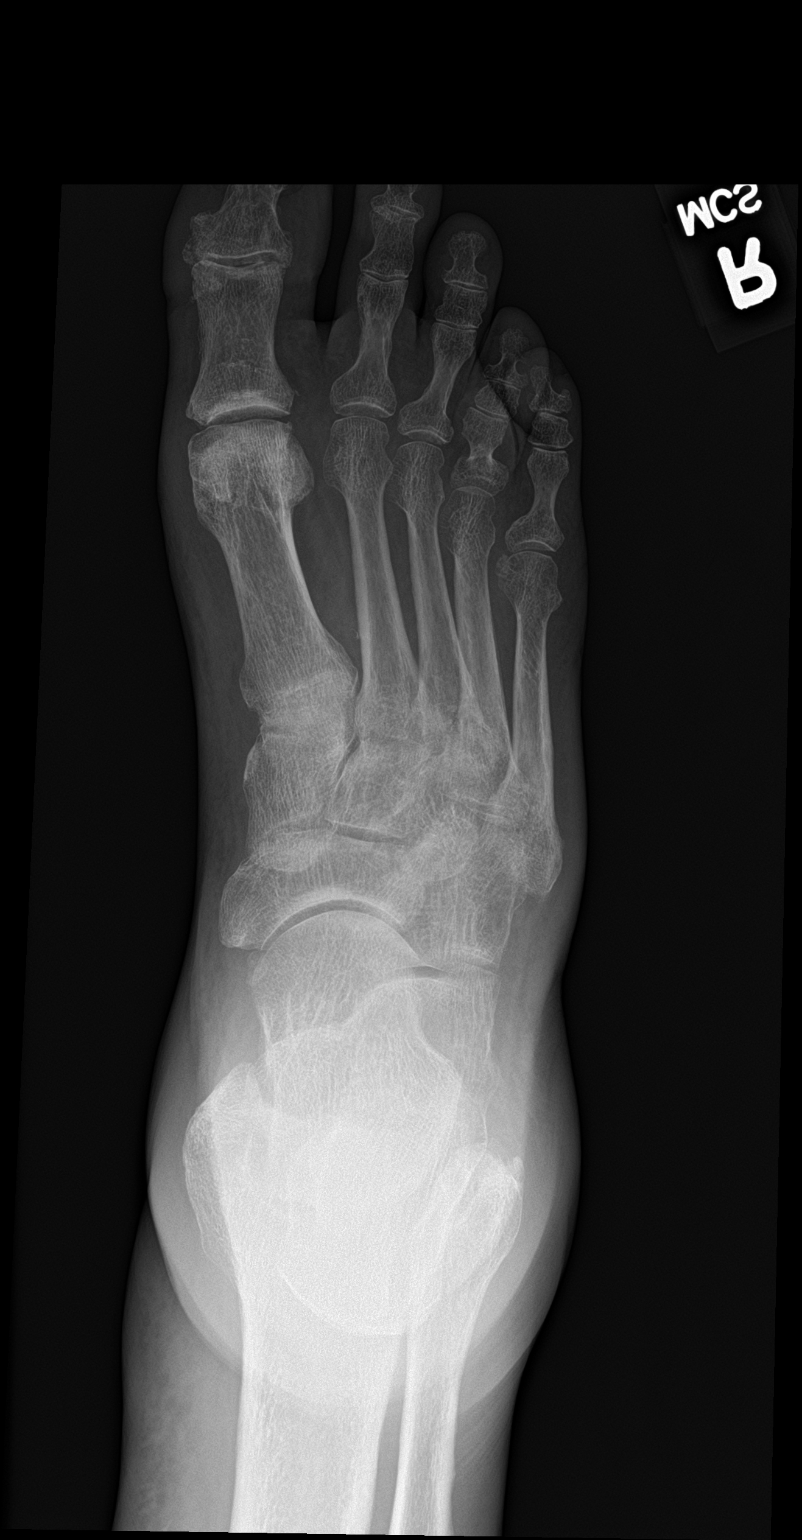

[foot obl]
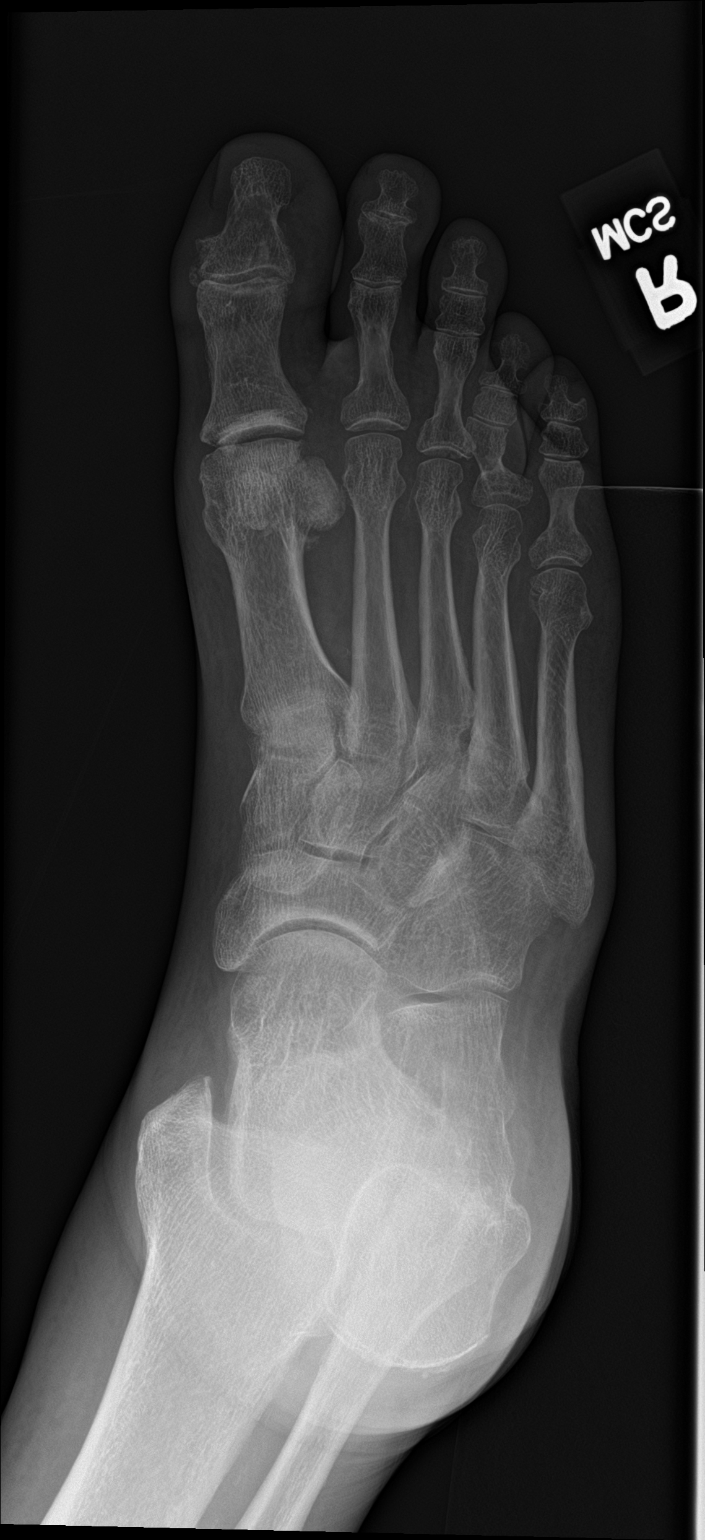

[foot lat]
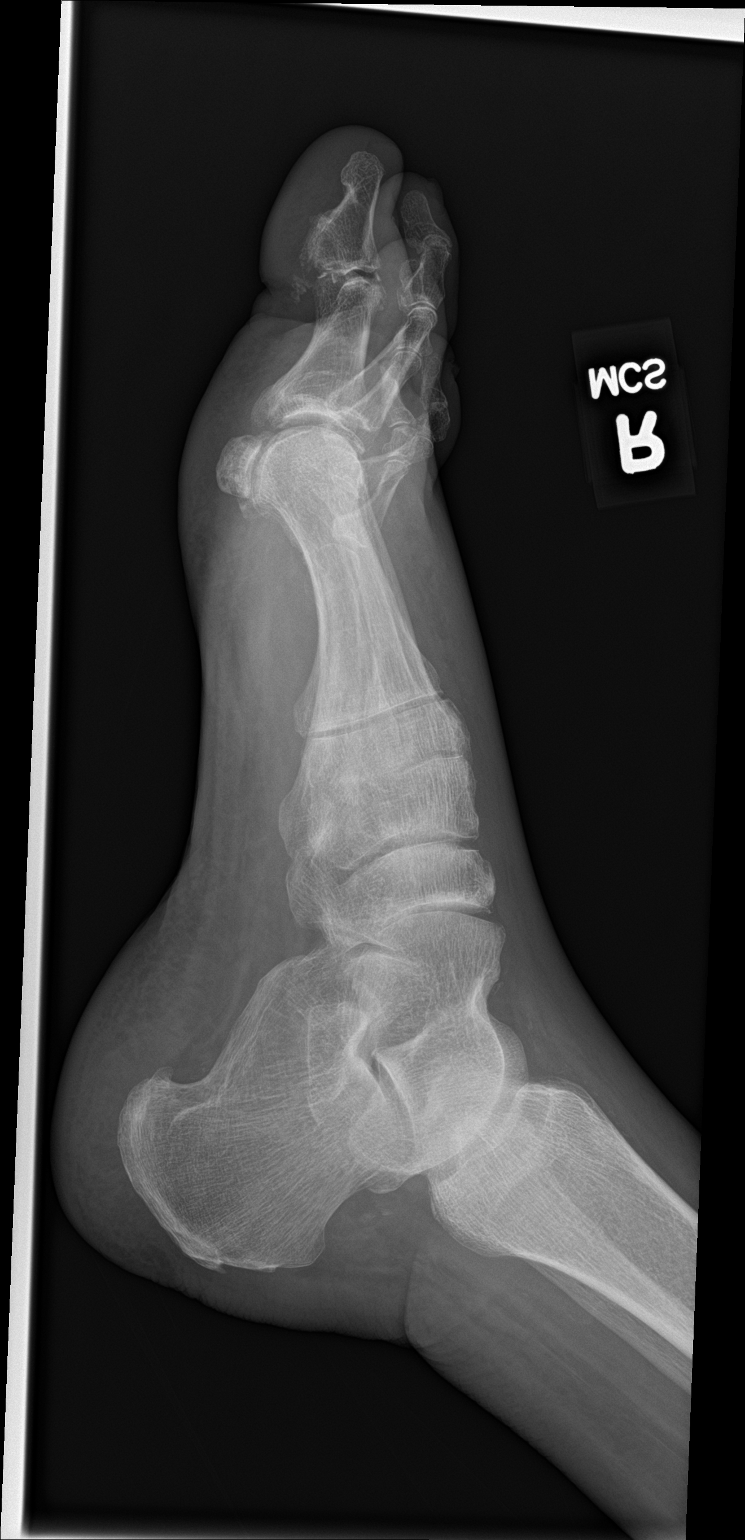

[3 of 3 positions shown; findings below may reference images not displayed]

FINDINGS: The bones are demineralized. No evidence of acute fracture or
dislocation. There is irregularity of the plantar base of the distal
1st phalanx on the lateral view which may relate to the previously
demonstrated dislocation. Stable degenerative changes at the
metatarsophalangeal and interphalangeal joints of the great toe. No
bone destruction, foreign body or soft tissue emphysema identified.
There may be some forefoot soft tissue swelling.
IMPRESSION: Possible plantar plate avulsion fracture from the distal 1st phalanx
related to previously demonstrated dislocation. No evidence of
osteomyelitis or acute osseous findings. Stable degenerative
changes.

## 2022-03-14 DIAGNOSIS — F5101 Primary insomnia: Secondary | ICD-10-CM | POA: Diagnosis not present

## 2022-03-14 DIAGNOSIS — E1151 Type 2 diabetes mellitus with diabetic peripheral angiopathy without gangrene: Secondary | ICD-10-CM | POA: Diagnosis not present

## 2022-03-14 DIAGNOSIS — E1142 Type 2 diabetes mellitus with diabetic polyneuropathy: Secondary | ICD-10-CM | POA: Diagnosis not present

## 2022-03-14 DIAGNOSIS — I480 Paroxysmal atrial fibrillation: Secondary | ICD-10-CM | POA: Diagnosis not present

## 2022-03-14 DIAGNOSIS — G8929 Other chronic pain: Secondary | ICD-10-CM | POA: Diagnosis not present

## 2022-03-14 IMAGING — MR MR FOOT*R* W/O CM
5 series · 40 of 40 positions shown · non-contrast
Comparison: X-ray 08/06/2020

CLINICAL DATA: Infection of the great toe and second toe. Clinical
concern for osteomyelitis

EXAM:
MRI OF THE RIGHT FOREFOOT WITHOUT CONTRAST
TECHNIQUE: Multiplanar, multisequence MR imaging of the right forefoot was
performed. No intravenous contrast was administered.

[Series 3: T1 · coronal · right · 3.0mm · 0.47mm/px · 10 of 43 slices shown (1 of 2)]
[im 1/43]
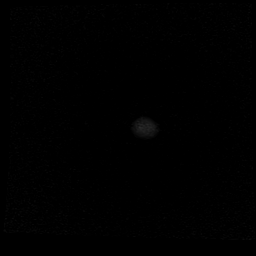
[im 5/43]
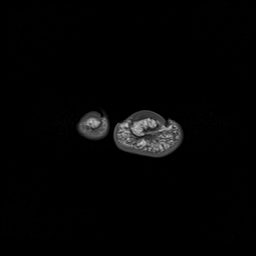
[im 10/43]
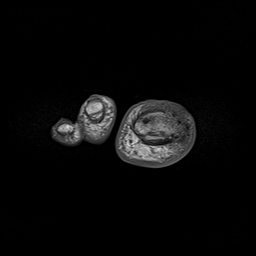
[im 15/43]
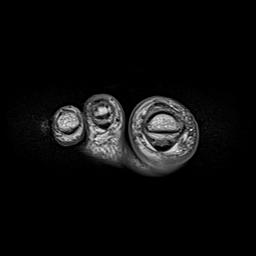
[im 19/43]
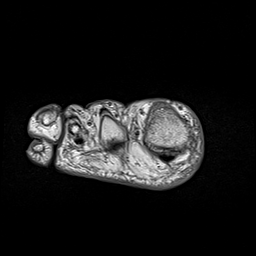
[im 24/43]
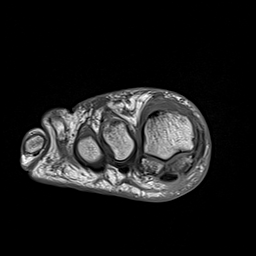
[im 29/43]
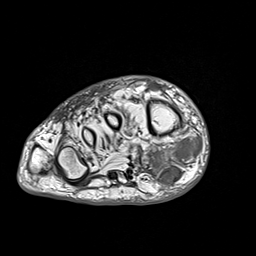
[im 33/43]
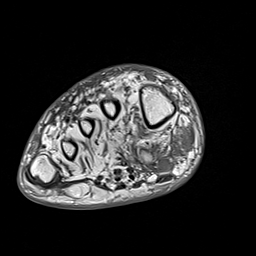
[im 38/43]
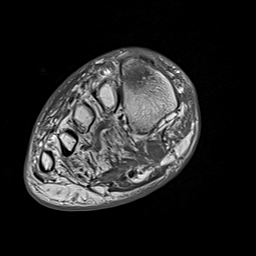
[im 43/43]
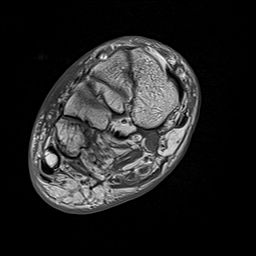

[Series 4: T2 fat-sat · coronal · right · 3.0mm · 0.38mm/px · 11 of 45 slices shown (1 of 2)]
[im 1/45]
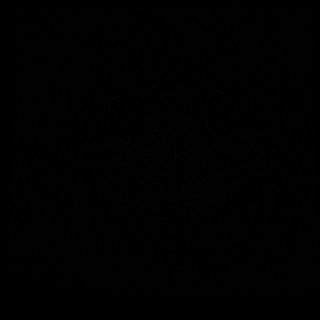
[im 5/45]
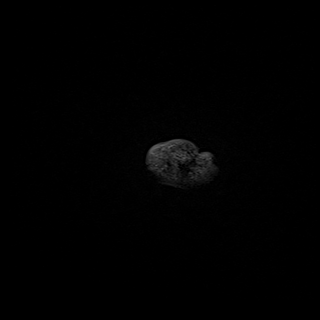
[im 9/45]
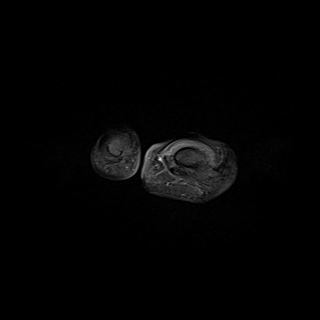
[im 14/45]
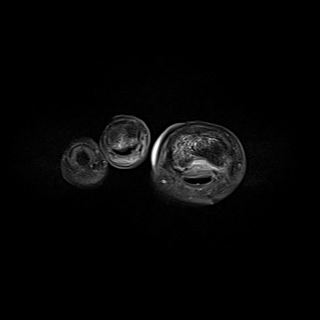
[im 18/45]
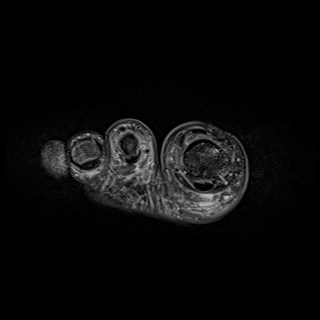
[im 23/45]
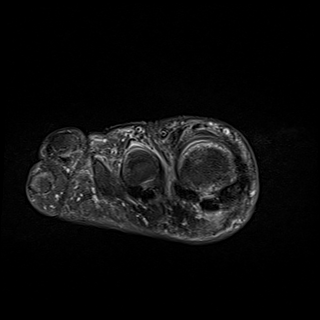
[im 27/45]
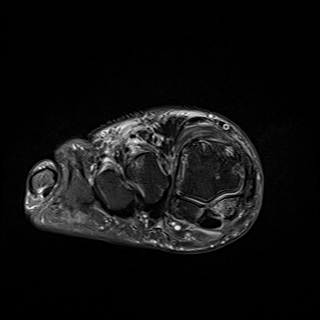
[im 31/45]
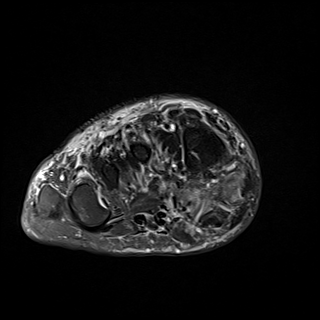
[im 36/45]
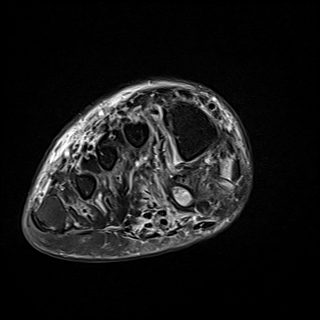
[im 40/45]
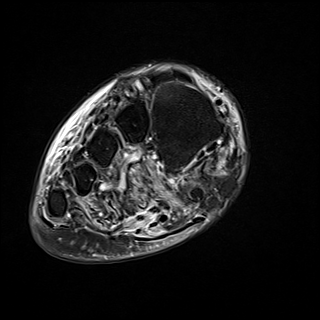
[im 45/45]
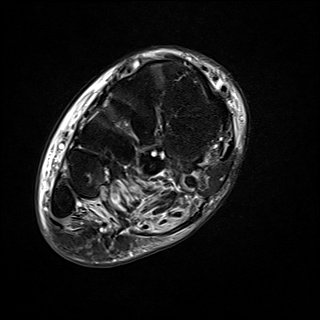

[Series 5: T2 fat-sat · axial · right · 3.0mm · 0.70mm/px · z∈[-160,-84]mm · 6 of 26 slices shown (2 of 2)]
[im 1/26]
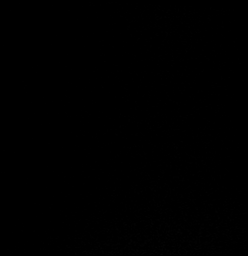
[im 6/26]
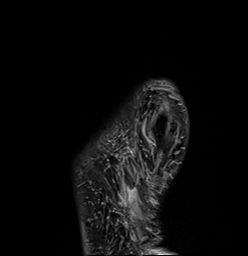
[im 11/26]
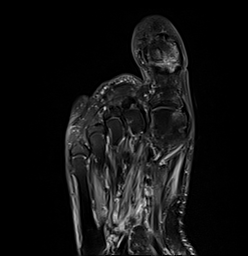
[im 16/26]
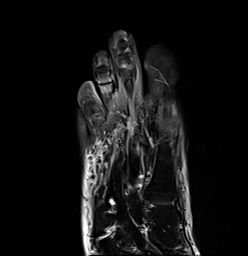
[im 21/26]
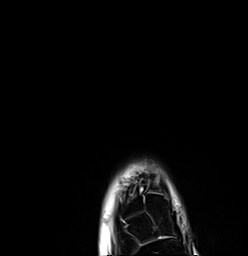
[im 26/26]
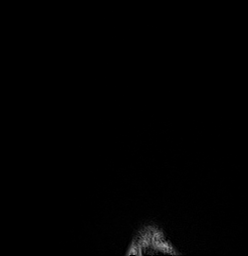

[Series 6: T1 · axial · right · 3.0mm · 0.70mm/px · z∈[-154,-82]mm · 6 of 25 slices shown (2 of 2)]
[im 1/25]
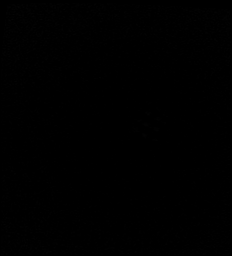
[im 5/25]
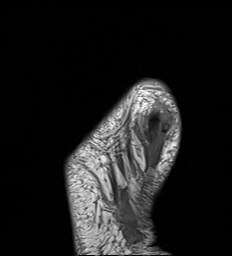
[im 10/25]
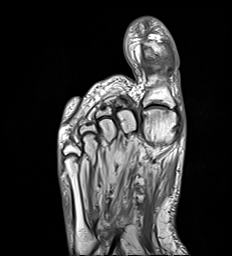
[im 15/25]
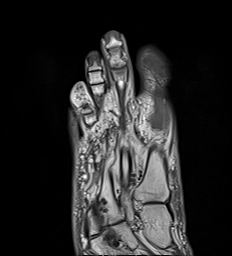
[im 20/25]
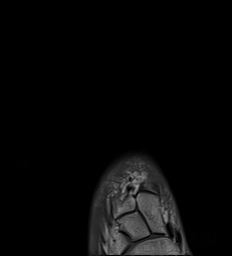
[im 25/25]
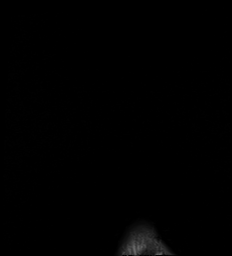

[Series 7: STIR · sagittal · right · 3.0mm · 0.35mm/px · 7 of 29 slices shown]
[im 1/29]
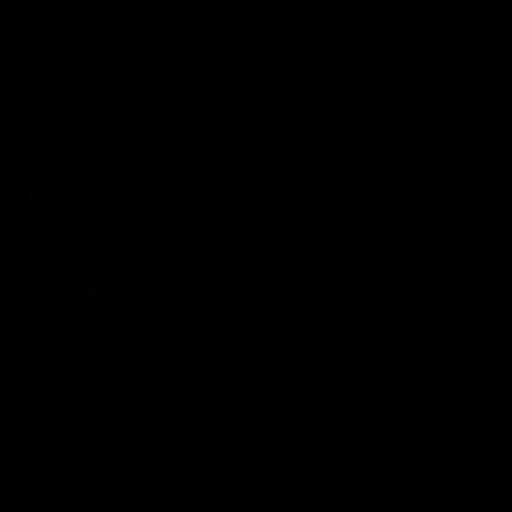
[im 5/29]
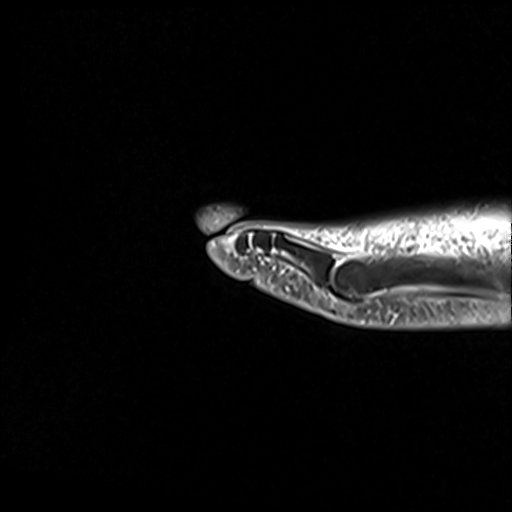
[im 10/29]
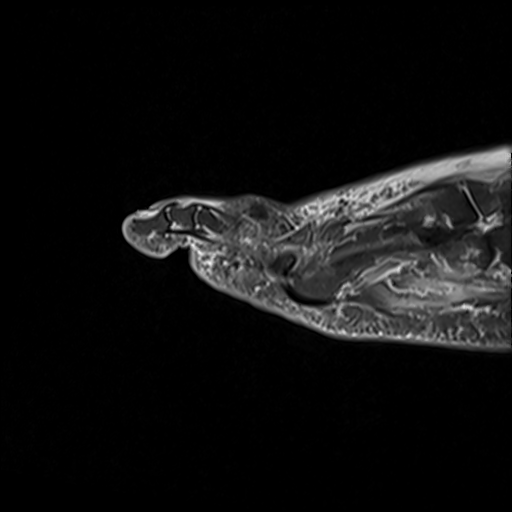
[im 15/29]
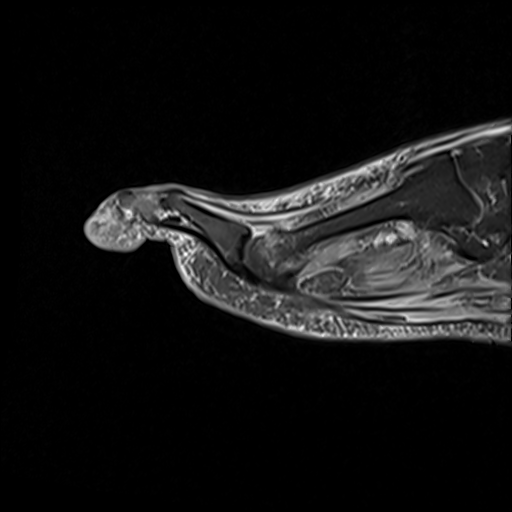
[im 19/29]
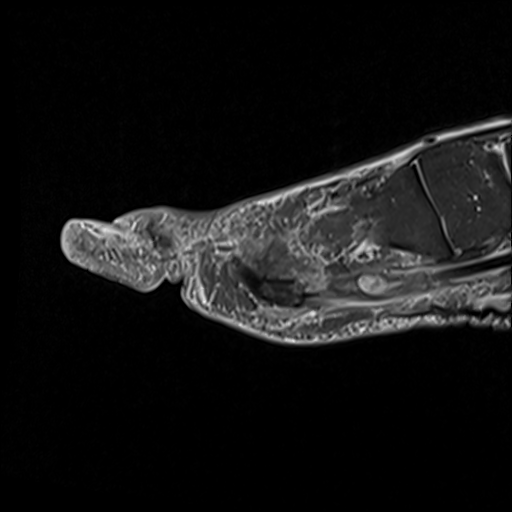
[im 24/29]
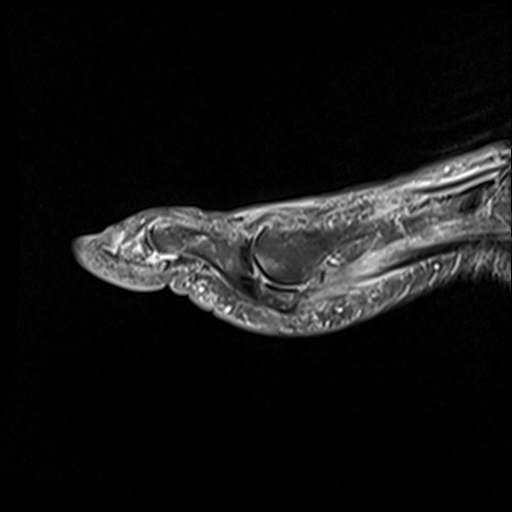
[im 29/29]
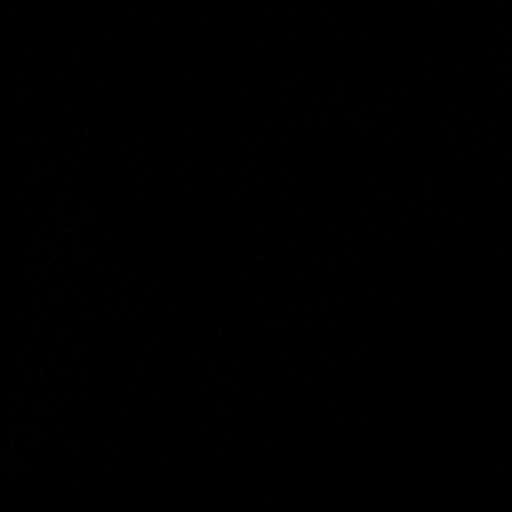

[40 of 40 positions shown; findings below may reference images not displayed]

FINDINGS: Bones/Joint/Cartilage

There is bone marrow edema of the periarticular aspects of the great
toe proximal and distal phalanx adjacent to the interphalangeal
joint (series 5, images 13-16) with subtle cortical disruption most
suggestive of nondisplaced fractures in the setting of recent
dislocation. Joint alignment is anatomic status post reduction.
There is faint marrow edema within the middle phalanx of the right
second toe (series 7, image 14) with preservation of the fatty T1
bone marrow signal. Fourth digit hammertoe deformity. The remaining
visualized osseous structures are intact without fracture,
dislocation, or evidence of osteomyelitis. Minimal degenerative
marrow signal changes of the first-third MTP joints.

Ligaments

Intact Lisfranc ligament. Collateral ligaments of the forefoot
appear intact.

Muscles and Tendons

Atrophy and fatty infiltration of the intrinsic foot musculature
with mild diffuse edema-like signal.

Soft tissues

No focal soft tissue ulceration no significant soft tissue swelling
or edema at the great toe or second toe. Mild dorsal subcutaneous
edema. There is an ovoid T2 heterogeneously hyperintense, T1
heterogeneously hyperintense thick-walled structure within the
plantar soft tissues of the forefoot underlying the mid first
metatarsal diaphysis adjacent to the first flexor tendon (series 4,
image 36; series 5, image 18) measuring 10 x 8 x 6 mm. Otherwise, no
soft tissue fluid collection.
IMPRESSION: 1. Bone marrow edema of the periarticular aspects of the great toe
proximal and distal phalanx adjacent to the IP joint with subtle
cortical irregularity most suggestive of nondisplaced fractures in
the setting of recent dislocation. Changes related to osteomyelitis
are felt less likely secondary to the relative lack of inflammatory
changes in the adjacent soft tissues.
2. Faint marrow edema within the middle phalanx of the right second
toe with preservation of the fatty T1 bone marrow signal. Findings
are favored to be reactive. Early acute osteomyelitis would be
difficult to entirely exclude.
3. Ovoid 10 mm cyst-like structure within the plantar soft tissues
of the forefoot underlying the mid first metatarsal diaphysis.
Differential considerations include a ganglion cyst, focal
adventitial bursa, soft tissue hemangioma. Abscess felt to be less
likely.
4. Atrophy and fatty infiltration of the intrinsic foot musculature
with mild diffuse edema-like signal. Findings likely represent a
combination of chronic denervation changes as well as a nonspecific
myositis.

## 2022-03-19 DIAGNOSIS — E1142 Type 2 diabetes mellitus with diabetic polyneuropathy: Secondary | ICD-10-CM | POA: Diagnosis not present

## 2022-03-19 DIAGNOSIS — F5101 Primary insomnia: Secondary | ICD-10-CM | POA: Diagnosis not present

## 2022-03-19 DIAGNOSIS — E1151 Type 2 diabetes mellitus with diabetic peripheral angiopathy without gangrene: Secondary | ICD-10-CM | POA: Diagnosis not present

## 2022-03-19 DIAGNOSIS — I480 Paroxysmal atrial fibrillation: Secondary | ICD-10-CM | POA: Diagnosis not present

## 2022-03-19 DIAGNOSIS — G8929 Other chronic pain: Secondary | ICD-10-CM | POA: Diagnosis not present

## 2022-03-21 DIAGNOSIS — E039 Hypothyroidism, unspecified: Secondary | ICD-10-CM | POA: Diagnosis not present

## 2022-03-22 DIAGNOSIS — E1140 Type 2 diabetes mellitus with diabetic neuropathy, unspecified: Secondary | ICD-10-CM | POA: Diagnosis not present

## 2022-03-22 DIAGNOSIS — I10 Essential (primary) hypertension: Secondary | ICD-10-CM | POA: Diagnosis not present

## 2022-03-22 DIAGNOSIS — E7800 Pure hypercholesterolemia, unspecified: Secondary | ICD-10-CM | POA: Diagnosis not present

## 2022-03-22 DIAGNOSIS — E1142 Type 2 diabetes mellitus with diabetic polyneuropathy: Secondary | ICD-10-CM | POA: Diagnosis not present

## 2022-03-22 DIAGNOSIS — K219 Gastro-esophageal reflux disease without esophagitis: Secondary | ICD-10-CM | POA: Diagnosis not present

## 2022-03-22 DIAGNOSIS — E1151 Type 2 diabetes mellitus with diabetic peripheral angiopathy without gangrene: Secondary | ICD-10-CM | POA: Diagnosis not present

## 2022-03-22 DIAGNOSIS — G8929 Other chronic pain: Secondary | ICD-10-CM | POA: Diagnosis not present

## 2022-03-22 DIAGNOSIS — I480 Paroxysmal atrial fibrillation: Secondary | ICD-10-CM | POA: Diagnosis not present

## 2022-03-22 DIAGNOSIS — F5101 Primary insomnia: Secondary | ICD-10-CM | POA: Diagnosis not present

## 2022-03-22 DIAGNOSIS — I4891 Unspecified atrial fibrillation: Secondary | ICD-10-CM | POA: Diagnosis not present

## 2022-03-26 DIAGNOSIS — G8929 Other chronic pain: Secondary | ICD-10-CM | POA: Diagnosis not present

## 2022-03-26 DIAGNOSIS — E1151 Type 2 diabetes mellitus with diabetic peripheral angiopathy without gangrene: Secondary | ICD-10-CM | POA: Diagnosis not present

## 2022-03-26 DIAGNOSIS — I480 Paroxysmal atrial fibrillation: Secondary | ICD-10-CM | POA: Diagnosis not present

## 2022-03-26 DIAGNOSIS — F5101 Primary insomnia: Secondary | ICD-10-CM | POA: Diagnosis not present

## 2022-03-26 DIAGNOSIS — E1142 Type 2 diabetes mellitus with diabetic polyneuropathy: Secondary | ICD-10-CM | POA: Diagnosis not present

## 2022-03-27 ENCOUNTER — Emergency Department (HOSPITAL_COMMUNITY): Payer: Medicare Other

## 2022-03-27 ENCOUNTER — Emergency Department (HOSPITAL_COMMUNITY)
Admission: EM | Admit: 2022-03-27 | Discharge: 2022-03-27 | Disposition: A | Discharge status: Home / Self Care | Payer: Medicare Other | Attending: Emergency Medicine | Admitting: Emergency Medicine

## 2022-03-27 ENCOUNTER — Other Ambulatory Visit: Payer: Self-pay

## 2022-03-27 ENCOUNTER — Encounter (HOSPITAL_COMMUNITY): Payer: Self-pay | Admitting: Emergency Medicine

## 2022-03-27 DIAGNOSIS — Z85118 Personal history of other malignant neoplasm of bronchus and lung: Secondary | ICD-10-CM | POA: Insufficient documentation

## 2022-03-27 DIAGNOSIS — S0003XA Contusion of scalp, initial encounter: Secondary | ICD-10-CM | POA: Insufficient documentation

## 2022-03-27 DIAGNOSIS — R233 Spontaneous ecchymoses: Secondary | ICD-10-CM | POA: Diagnosis not present

## 2022-03-27 DIAGNOSIS — S0990XA Unspecified injury of head, initial encounter: Secondary | ICD-10-CM | POA: Diagnosis not present

## 2022-03-27 DIAGNOSIS — W01198A Fall on same level from slipping, tripping and stumbling with subsequent striking against other object, initial encounter: Secondary | ICD-10-CM | POA: Insufficient documentation

## 2022-03-27 DIAGNOSIS — M47812 Spondylosis without myelopathy or radiculopathy, cervical region: Secondary | ICD-10-CM | POA: Diagnosis not present

## 2022-03-27 DIAGNOSIS — E039 Hypothyroidism, unspecified: Secondary | ICD-10-CM | POA: Diagnosis not present

## 2022-03-27 DIAGNOSIS — Z7901 Long term (current) use of anticoagulants: Secondary | ICD-10-CM | POA: Insufficient documentation

## 2022-03-27 DIAGNOSIS — Z043 Encounter for examination and observation following other accident: Secondary | ICD-10-CM | POA: Diagnosis not present

## 2022-03-27 MED ORDER — OXYCODONE-ACETAMINOPHEN 5-325 MG PO TABS
2.0000 | ORAL_TABLET | Freq: Once | ORAL | Status: AC
  Administered 2022-03-27: 2 via ORAL
  Filled 2022-03-27: qty 2

## 2022-03-27 NOTE — ED Provider Notes (Signed)
[facility] DEPT Provider Note   CSN: [id number] Arrival date & time: 03/27/22  1614     History  Chief Complaint  Patient presents with   Fall   Eye Injury    MICA RELEFORD is a 81 y.o. male hx of lung cancer, afib on xarelto, hypothyroidism on synthroid here with fall. Patient took his Synthroid this morning and sat on the barstool and fell asleep and fell and hit his head.  He states that he then went to his eye doctor and he was checked out and there was no corneal abrasions or any retinal detachment.  Patient was sent here for CT scan to rule out orbital fracture.  Patient denies any double vision.  Denies any other injuries  The history is provided by the patient.      Home Medications Prior to Admission medications   Medication Sig Start Date End Date Taking? Authorizing Provider  ACCU-CHEK SMARTVIEW test strip CHECK 1 TO 2 TIMES A DAY 03/12/21   [provider]  albuterol (VENTOLIN HFA) 108 (90 Base) MCG/ACT inhaler 1-2 puff as needed 02/17/21   [provider]  amiodarone (PACERONE) 200 MG tablet Take 1 tablet (200 mg total) by mouth at bedtime. 07/18/21   Belva Crome, MD  amitriptyline (ELAVIL) 10 MG tablet Take 10 mg by mouth at bedtime. 05/10/21   [provider]  amitriptyline (ELAVIL) 50 MG tablet Take 50 mg by mouth at bedtime. 07/16/21   [provider]  atorvastatin (LIPITOR) 10 MG tablet Take 10 mg by mouth in the morning. 06/13/21   [provider]  Cholecalciferol (VITAMIN D3) 50 MCG (2000 UT) TABS Take 2,000 Units by mouth daily.    [provider]  Coenzyme Q10 (COQ10) 200 MG CAPS Take 200 mg by mouth in the morning.    [provider]  Cyanocobalamin (B-12) 5000 MCG CAPS Take 5,000 mcg by mouth daily. 04/27/21   Angiulli, Lavon Paganini, PA-C  famotidine (PEPCID) 20 MG tablet Take 20 mg by mouth daily as needed for heartburn or indigestion.    [provider]   finasteride (PROPECIA) 1 MG tablet Take 1 mg by mouth daily.    [provider]  furosemide (LASIX) 40 MG tablet Take 1 tablet (40 mg total) by mouth daily as needed (fluid retention/edema). 07/09/21   Collene Gobble, MD  glimepiride (AMARYL) 2 MG tablet Take 3 tablets (6 mg total) by mouth daily with breakfast. 04/27/21   Angiulli, Lavon Paganini, PA-C  JARDIANCE 25 MG TABS tablet Take 25 mg by mouth every evening. 05/01/21   [provider]  lactose free nutrition (BOOST) LIQD Take 237 mLs by mouth in the morning.    [provider]  Lidocaine HCl 4 % CREA Apply 1 application topically 4 (four) times daily as needed (pain.).    [provider]  metFORMIN (GLUCOPHAGE-XR) 500 MG 24 hr tablet Take 500-1,000 mg by mouth See admin instructions. Take 2 tablets (1000 mg) by mouth in the morning & take 1 tablet (500 mg) by mouth in the evening. 05/10/21   [provider]  metoprolol tartrate (LOPRESSOR) 25 MG tablet Take 1 tablet (25 mg total) by mouth 2 (two) times daily. 12/31/21   Belva Crome, MD  mirtazapine (REMERON) 15 MG tablet Take 1 tablet (15 mg total) by mouth at bedtime. 04/27/21   Angiulli, Lavon Paganini, PA-C  Multiple Vitamin (MULTIVITAMIN WITH MINERALS) TABS tablet Take 1 tablet by mouth  daily.    [provider]  ondansetron (ZOFRAN ODT) 4 MG disintegrating tablet Take 1 tablet (4 mg total) by mouth every 8 (eight) hours as needed for nausea or vomiting. 07/13/21   Horton, Alvin Critchley, DO  ondansetron (ZOFRAN) 4 MG tablet Take 4 mg by mouth every 6 (six) hours as needed. 07/13/21   [provider]  oxyCODONE-acetaminophen (PERCOCET) 7.5-325 MG tablet Take 1-2 tablets by mouth at bedtime as needed (pain.). 05/01/21   [provider]  OZEMPIC, 0.25 OR 0.5 MG/DOSE, 2 MG/1.5ML SOPN Inject 0.5 mg into the skin every Saturday. 06/26/21   [provider]  pregabalin (LYRICA) 300 MG capsule Take 300 mg by mouth 2 (two) times daily. 06/08/21    [provider]  rivaroxaban (XARELTO) 20 MG TABS tablet Take 1 tablet (20 mg total) by mouth every evening. OK to restart this medication on 01/01/2022 12/31/21   Collene Gobble, MD  senna-docusate (SENOKOT-S) 8.6-50 MG tablet Take 1 tablet by mouth daily as needed (constipation.). 07/09/21   Collene Gobble, MD      Allergies    Cymbalta [duloxetine hcl], Neurontin [gabapentin], Atorvastatin, Ciprofloxacin, Keppra [levetiracetam], Pioglitazone, and Simvastatin    Review of Systems   Review of Systems  HENT:         Left facial pain  All other systems reviewed and are negative.  Physical Exam Updated Vital Signs BP (!) 141/102 (BP Location: Left Arm)   Pulse 79   Temp 98.5 F (36.9 C) (Oral)   Resp 18   SpO2 99%  Physical Exam Vitals and nursing note reviewed.  HENT:     Head:     Comments: Patient has left forehead hematoma extending around the left eye.  Patient's extraocular movements are intact.  There is no obvious hyphema subconjunctival hemorrhage.    Nose: Nose normal.     Mouth/Throat:     Mouth: Mucous membranes are moist.  Eyes:     Extraocular Movements: Extraocular movements intact.     Pupils: Pupils are equal, round, and reactive to light.  Cardiovascular:     Rate and Rhythm: Normal rate and regular rhythm.     Pulses: Normal pulses.     Heart sounds: Normal heart sounds.  Pulmonary:     Effort: Pulmonary effort is normal.     Breath sounds: Normal breath sounds.  Abdominal:     General: Abdomen is flat.     Palpations: Abdomen is soft.  Musculoskeletal:        General: Normal range of motion.     Cervical back: Normal range of motion and neck supple.     Comments: No spinal tenderness and no extremity trauma  Skin:    General: Skin is warm.     Capillary Refill: Capillary refill takes less than 2 seconds.  Neurological:     General: No focal deficit present.     Mental Status: He is alert and oriented to person, place, and time.   Psychiatric:        Mood and Affect: Mood normal.        Behavior: Behavior normal.    ED Results / Procedures / Treatments   Labs (all labs ordered are listed, but only abnormal results are displayed) Labs Reviewed - No data to display  EKG None  Radiology CT HEAD WO CONTRAST (5MM)  Result Date: 03/27/2022 CLINICAL DATA:  Fall.  Head injury. EXAM: CT HEAD WITHOUT CONTRAST CT MAXILLOFACIAL WITHOUT CONTRAST CT CERVICAL  SPINE WITHOUT CONTRAST TECHNIQUE: Multidetector CT imaging of the head, cervical spine, and maxillofacial structures were performed using the standard protocol without intravenous contrast. Multiplanar CT image reconstructions of the cervical spine and maxillofacial structures were also generated. RADIATION DOSE REDUCTION: This exam was performed according to the departmental dose-optimization program which includes automated exposure control, adjustment of the mA and/or kV according to patient size and/or use of iterative reconstruction technique. COMPARISON:  MRI head 03/01/2022 FINDINGS: CT HEAD FINDINGS Brain: Moderate atrophy. Moderate chronic microvascular ischemic change in the white matter. Negative for acute infarct, intracranial hemorrhage, or mass lesion. Vascular: Negative for hyperdense vessel Skull: Negative for skull fracture. Moderately large left frontal scalp hematoma Other: None CT MAXILLOFACIAL FINDINGS Osseous: Negative for facial fracture Orbits: No orbital mass. Sinuses: Clear Soft tissues: Moderately large left frontal scalp hematoma above the left orbit. CT CERVICAL SPINE FINDINGS Alignment: Mild anterolisthesis C3-4 and C4-5. Mild retrolisthesis C5-6 Skull base and vertebrae: Negative for cervical spine fracture Soft tissues and spinal canal: Negative Disc levels: Disc and facet degeneration throughout the cervical spine. Left foraminal narrowing C3-4 and C4-5 due to spurring. Bilateral foraminal narrowing C5-6 and C6-7. Upper chest: Lung apices clear  bilaterally Other: None IMPRESSION: 1. Atrophy and chronic microvascular ischemic change in the white matter. No acute intracranial abnormality 2. Negative for facial fracture. Moderately large left frontal supraorbital scalp hematoma 3. Moderate cervical spondylosis. Negative for cervical spine fracture. Electronically Signed   By: Franchot Gallo M.D.   On: 03/27/2022 16:49   CT Cervical Spine Wo Contrast  Result Date: 03/27/2022 CLINICAL DATA:  Fall.  Head injury. EXAM: CT HEAD WITHOUT CONTRAST CT MAXILLOFACIAL WITHOUT CONTRAST CT CERVICAL SPINE WITHOUT CONTRAST TECHNIQUE: Multidetector CT imaging of the head, cervical spine, and maxillofacial structures were performed using the standard protocol without intravenous contrast. Multiplanar CT image reconstructions of the cervical spine and maxillofacial structures were also generated. RADIATION DOSE REDUCTION: This exam was performed according to the departmental dose-optimization program which includes automated exposure control, adjustment of the mA and/or kV according to patient size and/or use of iterative reconstruction technique. COMPARISON:  MRI head 03/01/2022 FINDINGS: CT HEAD FINDINGS Brain: Moderate atrophy. Moderate chronic microvascular ischemic change in the white matter. Negative for acute infarct, intracranial hemorrhage, or mass lesion. Vascular: Negative for hyperdense vessel Skull: Negative for skull fracture. Moderately large left frontal scalp hematoma Other: None CT MAXILLOFACIAL FINDINGS Osseous: Negative for facial fracture Orbits: No orbital mass. Sinuses: Clear Soft tissues: Moderately large left frontal scalp hematoma above the left orbit. CT CERVICAL SPINE FINDINGS Alignment: Mild anterolisthesis C3-4 and C4-5. Mild retrolisthesis C5-6 Skull base and vertebrae: Negative for cervical spine fracture Soft tissues and spinal canal: Negative Disc levels: Disc and facet degeneration throughout the cervical spine. Left foraminal narrowing  C3-4 and C4-5 due to spurring. Bilateral foraminal narrowing C5-6 and C6-7. Upper chest: Lung apices clear bilaterally Other: None IMPRESSION: 1. Atrophy and chronic microvascular ischemic change in the white matter. No acute intracranial abnormality 2. Negative for facial fracture. Moderately large left frontal supraorbital scalp hematoma 3. Moderate cervical spondylosis. Negative for cervical spine fracture. Electronically Signed   By: Franchot Gallo M.D.   On: 03/27/2022 16:49   CT Maxillofacial Wo Contrast  Result Date: 03/27/2022 CLINICAL DATA:  Fall.  Head injury. EXAM: CT HEAD WITHOUT CONTRAST CT MAXILLOFACIAL WITHOUT CONTRAST CT CERVICAL SPINE WITHOUT CONTRAST TECHNIQUE: Multidetector CT imaging of the head, cervical spine, and maxillofacial structures were performed using the standard protocol without intravenous contrast. Multiplanar  CT image reconstructions of the cervical spine and maxillofacial structures were also generated. RADIATION DOSE REDUCTION: This exam was performed according to the departmental dose-optimization program which includes automated exposure control, adjustment of the mA and/or kV according to patient size and/or use of iterative reconstruction technique. COMPARISON:  MRI head 03/01/2022 FINDINGS: CT HEAD FINDINGS Brain: Moderate atrophy. Moderate chronic microvascular ischemic change in the white matter. Negative for acute infarct, intracranial hemorrhage, or mass lesion. Vascular: Negative for hyperdense vessel Skull: Negative for skull fracture. Moderately large left frontal scalp hematoma Other: None CT MAXILLOFACIAL FINDINGS Osseous: Negative for facial fracture Orbits: No orbital mass. Sinuses: Clear Soft tissues: Moderately large left frontal scalp hematoma above the left orbit. CT CERVICAL SPINE FINDINGS Alignment: Mild anterolisthesis C3-4 and C4-5. Mild retrolisthesis C5-6 Skull base and vertebrae: Negative for cervical spine fracture Soft tissues and spinal canal:  Negative Disc levels: Disc and facet degeneration throughout the cervical spine. Left foraminal narrowing C3-4 and C4-5 due to spurring. Bilateral foraminal narrowing C5-6 and C6-7. Upper chest: Lung apices clear bilaterally Other: None IMPRESSION: 1. Atrophy and chronic microvascular ischemic change in the white matter. No acute intracranial abnormality 2. Negative for facial fracture. Moderately large left frontal supraorbital scalp hematoma 3. Moderate cervical spondylosis. Negative for cervical spine fracture. Electronically Signed   By: Franchot Gallo M.D.   On: 03/27/2022 16:49    Procedures Procedures    Medications Ordered in ED Medications - No data to display  ED Course/ Medical Decision Making/ A&P                           Medical Decision Making THERON CUMBIE is a 81 y.o. male here presenting with a fall.  Patient is on Xarelto.  He had a mechanical fall and hit the left face. Patient was seen by ophtho and has no obvious eye injury but recommend CT to r/o orbital fracture. Neurologically intact. Will get CT head/neck/face   5:40 PM CT showed no facial fracture but just scalp hematoma.  Patient is stable for discharge.  I told him to hold Xarelto for 3 days   Problems Addressed: Scalp hematoma, initial encounter: acute illness or injury  Amount and/or Complexity of Data Reviewed Radiology: ordered and independent interpretation performed. Decision-making details documented in ED Course.    Final Clinical Impression(s) / ED Diagnoses Final diagnoses:  None    Rx / DC Orders ED Discharge Orders     None         Drenda Freeze, MD 03/27/22 [id number]

## 2022-03-27 NOTE — ED Triage Notes (Signed)
Pt reports falling down and hitting face. Pt on blood thinners. Pt had hematoma on left eye

## 2022-03-27 NOTE — Discharge Instructions (Addendum)
You have scalp hematoma.   Please apply ice on it.   Please hold Xarelto for 3 days  See your doctor for follow-up  Expect your face to be black and blue for several days  Return to ER if you have worse eye pain, double vision, headache, vomiting

## 2022-03-29 ENCOUNTER — Ambulatory Visit (HOSPITAL_COMMUNITY): Payer: Medicare Other | Attending: Radiation Oncology

## 2022-03-29 ENCOUNTER — Encounter (HOSPITAL_COMMUNITY): Payer: Self-pay

## 2022-04-01 ENCOUNTER — Inpatient Hospital Stay: Payer: Medicare Other | Attending: Internal Medicine

## 2022-04-03 DIAGNOSIS — T148XXD Other injury of unspecified body region, subsequent encounter: Secondary | ICD-10-CM | POA: Diagnosis not present

## 2022-04-04 ENCOUNTER — Other Ambulatory Visit: Payer: Self-pay

## 2022-04-04 ENCOUNTER — Inpatient Hospital Stay: Payer: Medicare Other | Admitting: Internal Medicine

## 2022-04-04 ENCOUNTER — Telehealth: Payer: Self-pay | Admitting: *Deleted

## 2022-04-04 NOTE — Telephone Encounter (Signed)
Patient arrived for visit without MRI completed.  Advised patient to get MRI scheduled and reach out to me to get his appointment rescheduled.  Patient and wife stated understanding.

## 2022-04-09 ENCOUNTER — Telehealth: Payer: Self-pay | Admitting: Internal Medicine

## 2022-04-09 NOTE — Telephone Encounter (Signed)
Called patient regarding upcoming July appointment, patient is notified.

## 2022-04-15 ENCOUNTER — Ambulatory Visit (HOSPITAL_COMMUNITY)
Admission: RE | Admit: 2022-04-15 | Discharge: 2022-04-15 | Disposition: A | Discharge status: Home / Self Care | Payer: Medicare Other | Source: Ambulatory Visit | Attending: Radiation Oncology | Admitting: Radiation Oncology

## 2022-04-15 DIAGNOSIS — I480 Paroxysmal atrial fibrillation: Secondary | ICD-10-CM | POA: Diagnosis not present

## 2022-04-15 DIAGNOSIS — G9389 Other specified disorders of brain: Secondary | ICD-10-CM | POA: Diagnosis not present

## 2022-04-15 DIAGNOSIS — C7931 Secondary malignant neoplasm of brain: Secondary | ICD-10-CM | POA: Diagnosis not present

## 2022-04-15 DIAGNOSIS — K219 Gastro-esophageal reflux disease without esophagitis: Secondary | ICD-10-CM | POA: Diagnosis not present

## 2022-04-15 DIAGNOSIS — E7800 Pure hypercholesterolemia, unspecified: Secondary | ICD-10-CM | POA: Diagnosis not present

## 2022-04-15 DIAGNOSIS — I10 Essential (primary) hypertension: Secondary | ICD-10-CM | POA: Diagnosis not present

## 2022-04-15 DIAGNOSIS — E1140 Type 2 diabetes mellitus with diabetic neuropathy, unspecified: Secondary | ICD-10-CM | POA: Diagnosis not present

## 2022-04-15 DIAGNOSIS — E039 Hypothyroidism, unspecified: Secondary | ICD-10-CM | POA: Diagnosis not present

## 2022-04-15 MED ORDER — GADOBUTROL 1 MMOL/ML IV SOLN
8.0000 mL | Freq: Once | INTRAVENOUS | Status: AC | PRN
  Administered 2022-04-15: 8 mL via INTRAVENOUS

## 2022-04-19 ENCOUNTER — Encounter: Payer: Self-pay | Admitting: *Deleted

## 2022-04-23 DIAGNOSIS — E039 Hypothyroidism, unspecified: Secondary | ICD-10-CM | POA: Diagnosis not present

## 2022-05-01 ENCOUNTER — Other Ambulatory Visit: Payer: Self-pay

## 2022-05-01 ENCOUNTER — Ambulatory Visit (HOSPITAL_COMMUNITY)
Admission: RE | Admit: 2022-05-01 | Discharge: 2022-05-01 | Disposition: A | Discharge status: Home / Self Care | Payer: Medicare Other | Source: Ambulatory Visit | Attending: Internal Medicine | Admitting: Internal Medicine

## 2022-05-01 ENCOUNTER — Inpatient Hospital Stay: Payer: Medicare Other | Attending: Internal Medicine

## 2022-05-01 DIAGNOSIS — I700 Atherosclerosis of aorta: Secondary | ICD-10-CM | POA: Diagnosis not present

## 2022-05-01 DIAGNOSIS — Z79899 Other long term (current) drug therapy: Secondary | ICD-10-CM | POA: Diagnosis not present

## 2022-05-01 DIAGNOSIS — C3432 Malignant neoplasm of lower lobe, left bronchus or lung: Secondary | ICD-10-CM | POA: Insufficient documentation

## 2022-05-01 DIAGNOSIS — Z923 Personal history of irradiation: Secondary | ICD-10-CM | POA: Insufficient documentation

## 2022-05-01 DIAGNOSIS — C3490 Malignant neoplasm of unspecified part of unspecified bronchus or lung: Secondary | ICD-10-CM | POA: Insufficient documentation

## 2022-05-01 DIAGNOSIS — R053 Chronic cough: Secondary | ICD-10-CM | POA: Diagnosis not present

## 2022-05-01 DIAGNOSIS — R5383 Other fatigue: Secondary | ICD-10-CM | POA: Diagnosis not present

## 2022-05-01 DIAGNOSIS — G629 Polyneuropathy, unspecified: Secondary | ICD-10-CM | POA: Insufficient documentation

## 2022-05-01 LAB — CBC WITH DIFFERENTIAL (CANCER CENTER ONLY)
Abs Immature Granulocytes: 0.08 10*3/uL — ABNORMAL HIGH (ref 0.00–0.07)
Basophils Absolute: 0 10*3/uL (ref 0.0–0.1)
Basophils Relative: 0 %
Eosinophils Absolute: 0 10*3/uL (ref 0.0–0.5)
Eosinophils Relative: 0 %
HCT: 37.3 % — ABNORMAL LOW (ref 39.0–52.0)
Hemoglobin: 12.7 g/dL — ABNORMAL LOW (ref 13.0–17.0)
Immature Granulocytes: 2 %
Lymphocytes Relative: 16 %
Lymphs Abs: 0.7 10*3/uL (ref 0.7–4.0)
MCH: 31.9 pg (ref 26.0–34.0)
MCHC: 34 g/dL (ref 30.0–36.0)
MCV: 93.7 fL (ref 80.0–100.0)
Monocytes Absolute: 0.8 10*3/uL (ref 0.1–1.0)
Monocytes Relative: 19 %
Neutro Abs: 2.6 10*3/uL (ref 1.7–7.7)
Neutrophils Relative %: 63 %
Platelet Count: 162 10*3/uL (ref 150–400)
RBC: 3.98 MIL/uL — ABNORMAL LOW (ref 4.22–5.81)
RDW: 13.6 % (ref 11.5–15.5)
WBC Count: 4.2 10*3/uL (ref 4.0–10.5)
nRBC: 0 % (ref 0.0–0.2)

## 2022-05-01 LAB — CMP (CANCER CENTER ONLY)
ALT: 18 U/L (ref 0–44)
AST: 17 U/L (ref 15–41)
Albumin: 3.9 g/dL (ref 3.5–5.0)
Alkaline Phosphatase: 81 U/L (ref 38–126)
Anion gap: 8 (ref 5–15)
BUN: 45 mg/dL — ABNORMAL HIGH (ref 8–23)
CO2: 31 mmol/L (ref 22–32)
Calcium: 9.6 mg/dL (ref 8.9–10.3)
Chloride: 100 mmol/L (ref 98–111)
Creatinine: 1.54 mg/dL — ABNORMAL HIGH (ref 0.61–1.24)
GFR, Estimated: 44 mL/min — ABNORMAL LOW (ref >60)
Glucose, Bld: 193 mg/dL — ABNORMAL HIGH (ref 70–99)
Potassium: 3.9 mmol/L (ref 3.5–5.1)
Sodium: 139 mmol/L (ref 135–145)
Total Bilirubin: 0.6 mg/dL (ref 0.3–1.2)
Total Protein: 7.7 g/dL (ref 6.5–8.1)

## 2022-05-01 MED ORDER — IOHEXOL 300 MG/ML  SOLN
75.0000 mL | Freq: Once | INTRAMUSCULAR | Status: AC | PRN
  Administered 2022-05-01: 75 mL via INTRAVENOUS

## 2022-05-06 ENCOUNTER — Other Ambulatory Visit: Payer: Medicare Other

## 2022-05-08 ENCOUNTER — Inpatient Hospital Stay: Payer: Medicare Other | Admitting: Internal Medicine

## 2022-05-08 ENCOUNTER — Encounter: Payer: Self-pay | Admitting: Internal Medicine

## 2022-05-08 ENCOUNTER — Other Ambulatory Visit: Payer: Self-pay

## 2022-05-08 VITALS — BP 107/49 | HR 72 | Temp 98.1°F | Resp 18 | Ht 72.0 in | Wt 201.6 lb

## 2022-05-08 DIAGNOSIS — R5383 Other fatigue: Secondary | ICD-10-CM | POA: Diagnosis not present

## 2022-05-08 DIAGNOSIS — R053 Chronic cough: Secondary | ICD-10-CM | POA: Diagnosis not present

## 2022-05-08 DIAGNOSIS — Z79899 Other long term (current) drug therapy: Secondary | ICD-10-CM | POA: Diagnosis not present

## 2022-05-08 DIAGNOSIS — C3492 Malignant neoplasm of unspecified part of left bronchus or lung: Secondary | ICD-10-CM

## 2022-05-08 DIAGNOSIS — C3490 Malignant neoplasm of unspecified part of unspecified bronchus or lung: Secondary | ICD-10-CM

## 2022-05-08 DIAGNOSIS — Z923 Personal history of irradiation: Secondary | ICD-10-CM | POA: Diagnosis not present

## 2022-05-08 DIAGNOSIS — G629 Polyneuropathy, unspecified: Secondary | ICD-10-CM | POA: Diagnosis not present

## 2022-05-08 DIAGNOSIS — C3432 Malignant neoplasm of lower lobe, left bronchus or lung: Secondary | ICD-10-CM | POA: Diagnosis not present

## 2022-05-08 MED ORDER — METHYLPREDNISOLONE 4 MG PO TBPK: ORAL_TABLET | ORAL | 0 refills | Status: DC

## 2022-05-08 NOTE — Progress Notes (Signed)
[place] [phone]   [phone]  OFFICE PROGRESS NOTE  [name], MD [address] [place] [address] [address]  DIAGNOSIS: Recurrent non-small cell lung cancer initially diagnosed as stage IIb (T3, N0, M0) non-small cell lung cancer, squamous cell carcinoma presented with large left lower lobe lung mass in addition to suspicious left hilar lymphadenopathy (but is not completely confirmed)  diagnosed in September 2022.  The patient had recurrent disease in the form of left lower lobe lung nodule that biopsy-proven to be squamous cell carcinoma in March 2023  PRIOR THERAPY:  1) Status post SBRT to the large left lower lobe lung mass under the care of Dr. Lisbeth Renshaw completed August 17, 2021. 2) status post SBRT to another left lower lobe lung nodule under the care of Dr. Lisbeth Renshaw completed on 01/30/2022  CURRENT THERAPY: Observation  INTERVAL HISTORY: [name] 81 y.o. male returns to the clinic today for follow-up visit accompanied by his wife.  The patient is feeling fine today with no concerning complaints except for persistent cough productive of creamy sputum.  He denied having any chest pain, shortness of breath, or hemoptysis.  He denied having any fever or chills.  He has no nausea, vomiting, diarrhea or constipation.  He has no headache or visual changes.  The patient had repeat CT scan of the chest performed recently and he is here for evaluation and discussion of his scan results.  MEDICAL HISTORY: Past Medical History:  Diagnosis Date   AAA (abdominal aortic aneurysm) ([place])    a. s/p repair 2009.   Acid reflux    takes Prilosec and Protonix daily   Arthritis    BacK   CAD in native artery    a. a. prior inf MI 1993. b. PCI to LAD 05/1997. c. recurrent inferolateral MI complicated by V. fib arrest in 04/1998, prior PCI to OM1. d. 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   Cancer (HCC)    basal and squamous cell   Chronic combined  systolic and diastolic CHF (congestive heart failure) (HCC)    Complication of anesthesia    had an ileus after a couple of surgeries   COVID    Depression    over 30 years ago   Dry skin    Encephalitis    Foot drop, bilateral    Hypercholesteremia    Hypertension    Ileus (HCC)    After AAA   Incisional hernia    "small, from AAA"   Ischemic cardiomyopathy    MI (myocardial infarction) ([place])    Neuromuscular disorder ([place])    neuropathy in feet   Neuropathy    in feet & legs   OSA on CPAP    uses CPAP--sleep study done at least 87yrs ago   PAF (paroxysmal atrial fibrillation) (HCC)    Pain    back pain chronic- seen at pain clinic   Plantar fasciitis    bilatetral   Pneumonia    Thrombocytopenia (HCC)    Type II diabetes mellitus (HCC)     ALLERGIES:  is allergic to cymbalta [duloxetine hcl], neurontin [gabapentin], atorvastatin, ciprofloxacin, keppra [levetiracetam], pioglitazone, and simvastatin.  MEDICATIONS:  Current Outpatient Medications  Medication Sig Dispense Refill   ACCU-CHEK SMARTVIEW test strip CHECK 1 TO 2 TIMES A DAY     albuterol (VENTOLIN HFA) 108 (90 Base) MCG/ACT inhaler 1-2 puff as needed     amiodarone (PACERONE) 200 MG tablet  Take 1 tablet (200 mg total) by mouth at bedtime. 90 tablet 3   amitriptyline (ELAVIL) 10 MG tablet Take 10 mg by mouth at bedtime.     amitriptyline (ELAVIL) 50 MG tablet Take 50 mg by mouth at bedtime.     atorvastatin (LIPITOR) 10 MG tablet Take 10 mg by mouth in the morning.     Cholecalciferol (VITAMIN D3) 50 MCG (2000 UT) TABS Take 2,000 Units by mouth daily.     Coenzyme Q10 (COQ10) 200 MG CAPS Take 200 mg by mouth in the morning.     Cyanocobalamin (B-12) 5000 MCG CAPS Take 5,000 mcg by mouth daily. 30 capsule 0   famotidine (PEPCID) 20 MG tablet Take 20 mg by mouth daily as needed for heartburn or indigestion.     finasteride (PROPECIA) 1 MG tablet Take 1 mg by mouth daily.     furosemide (LASIX) 40 MG tablet Take  1 tablet (40 mg total) by mouth daily as needed (fluid retention/edema).     glimepiride (AMARYL) 2 MG tablet Take 3 tablets (6 mg total) by mouth daily with breakfast. 90 tablet 0   JARDIANCE 25 MG TABS tablet Take 25 mg by mouth every evening.     lactose free nutrition (BOOST) LIQD Take 237 mLs by mouth in the morning.     Lidocaine HCl 4 % CREA Apply 1 application topically 4 (four) times daily as needed (pain.).     metFORMIN (GLUCOPHAGE-XR) 500 MG 24 hr tablet Take 500-1,000 mg by mouth See admin instructions. Take 2 tablets (1000 mg) by mouth in the morning & take 1 tablet (500 mg) by mouth in the evening.     metoprolol tartrate (LOPRESSOR) 25 MG tablet Take 1 tablet (25 mg total) by mouth 2 (two) times daily. 180 tablet 3   mirtazapine (REMERON) 15 MG tablet Take 1 tablet (15 mg total) by mouth at bedtime. 30 tablet 0   Multiple Vitamin (MULTIVITAMIN WITH MINERALS) TABS tablet Take 1 tablet by mouth daily.     ondansetron (ZOFRAN ODT) 4 MG disintegrating tablet Take 1 tablet (4 mg total) by mouth every 8 (eight) hours as needed for nausea or vomiting. 20 tablet 0   ondansetron (ZOFRAN) 4 MG tablet Take 4 mg by mouth every 6 (six) hours as needed.     oxyCODONE-acetaminophen (PERCOCET) 7.5-325 MG tablet Take 1-2 tablets by mouth at bedtime as needed (pain.).     OZEMPIC, 0.25 OR 0.5 MG/DOSE, 2 MG/1.5ML SOPN Inject 0.5 mg into the skin every Saturday.     pregabalin (LYRICA) 300 MG capsule Take 300 mg by mouth 2 (two) times daily.     rivaroxaban (XARELTO) 20 MG TABS tablet Take 1 tablet (20 mg total) by mouth every evening. OK to restart this medication on 01/01/2022 30 tablet 0   senna-docusate (SENOKOT-S) 8.6-50 MG tablet Take 1 tablet by mouth daily as needed (constipation.).     No current facility-administered medications for this visit.    SURGICAL HISTORY:  Past Surgical History:  Procedure Laterality Date   ABDOMINAL AORTIC ANEURYSM REPAIR     BACK SURGERY  2012-2013 X 3    Miminal Invasive x 3in [place].   BRONCHIAL BIOPSY  07/09/2021   Procedure: BRONCHIAL BIOPSIES;  Surgeon: Collene Gobble, MD;  Location: [facility] ENDOSCOPY;  Service: Pulmonary;;   BRONCHIAL BIOPSY  12/31/2021   Procedure: BRONCHIAL BIOPSIES;  Surgeon: Collene Gobble, MD;  Location[facility] ENDOSCOPY;  Service: Pulmonary;;   BRONCHIAL BRUSHINGS  07/09/2021  Procedure: BRONCHIAL BRUSHINGS;  Surgeon: Collene Gobble, MD;  Location: [facility] ENDOSCOPY;  Service: Pulmonary;;   BRONCHIAL BRUSHINGS  12/31/2021   Procedure: BRONCHIAL BRUSHINGS;  Surgeon: Collene Gobble, MD;  Location: [facility] ENDOSCOPY;  Service: Pulmonary;;   BRONCHIAL NEEDLE ASPIRATION BIOPSY  07/09/2021   Procedure: BRONCHIAL NEEDLE ASPIRATION BIOPSIES;  Surgeon: Collene Gobble, MD;  Location: [facility] ENDOSCOPY;  Service: Pulmonary;;   BRONCHIAL NEEDLE ASPIRATION BIOPSY  12/31/2021   Procedure: BRONCHIAL NEEDLE ASPIRATION BIOPSIES;  Surgeon: Collene Gobble, MD;  Location: [place];  Service: Pulmonary;;   BRONCHIAL WASHINGS  12/31/2021   Procedure: BRONCHIAL WASHINGS;  Surgeon: Collene Gobble, MD;  Location: [place];  Service: Pulmonary;;   CARDIAC CATHETERIZATION  2009/2012   CARDIOVERSION N/A 04/12/2015   Procedure: CARDIOVERSION;  Surgeon: Sueanne Margarita, MD;  Location: [place];  Service: Cardiovascular;  Laterality: N/A;   CARDIOVERSION N/A 12/26/2016   Procedure: CARDIOVERSION;  Surgeon: Sueanne Margarita, MD;  Location: [place];  Service: Cardiovascular;  Laterality: N/A;   [place]; [place]  07/09/2021   Procedure: FIDUCIAL MARKER PLACEMENT;  Surgeon: Collene Gobble, MD;  Location: [place];  Service: Pulmonary;;   FIDUCIAL MARKER PLACEMENT  12/31/2021   Procedure: FIDUCIAL MARKER PLACEMENT;  Surgeon: Collene Gobble, MD;  Location: [facility] ENDOSCOPY;  Service: Pulmonary;;   LAPAROSCOPIC CHOLECYSTECTOMY     LUMBAR LAMINECTOMY/DECOMPRESSION MICRODISCECTOMY Right  01/16/2016   Procedure: Right Lumbar five-Sacral one Laminectomy;  Surgeon: Kristeen Miss, MD;  Location: [facility] NEURO ORS;  Service: Neurosurgery;  Laterality: Right;  Right L5-S1 Laminectomy   VIDEO BRONCHOSCOPY WITH ENDOBRONCHIAL NAVIGATION N/A 07/09/2021   Procedure: ROBOTIC VIDEO BRONCHOSCOPY WITH ENDOBRONCHIAL NAVIGATION;  Surgeon: Collene Gobble, MD;  Location: [place] ENDOSCOPY;  Service: Pulmonary;  Laterality: N/A;   VIDEO BRONCHOSCOPY WITH RADIAL ENDOBRONCHIAL ULTRASOUND  07/09/2021   Procedure: VIDEO BRONCHOSCOPY WITH RADIAL ENDOBRONCHIAL ULTRASOUND;  Surgeon: Collene Gobble, MD;  Location: [facility] ENDOSCOPY;  Service: Pulmonary;;   VIDEO BRONCHOSCOPY WITH RADIAL ENDOBRONCHIAL ULTRASOUND  12/31/2021   Procedure: VIDEO BRONCHOSCOPY WITH RADIAL ENDOBRONCHIAL ULTRASOUND;  Surgeon: Collene Gobble, MD;  Location: [facility] ENDOSCOPY;  Service: Pulmonary;;    REVIEW OF SYSTEMS:  A comprehensive review of systems was negative except for: Constitutional: positive for fatigue Respiratory: positive for cough and dyspnea on exertion   PHYSICAL EXAMINATION: General appearance: alert, cooperative, fatigued, and no distress Head: Normocephalic, without obvious abnormality, atraumatic Neck: no adenopathy, no JVD, supple, symmetrical, trachea midline, and thyroid not enlarged, symmetric, no tenderness/mass/nodules Lymph nodes: Cervical, supraclavicular, and axillary nodes normal. Resp: wheezes LLL Back: symmetric, no curvature. ROM normal. No CVA tenderness. Cardio: regular rate and rhythm, S1, S2 normal, no murmur, click, rub or gallop GI: soft, non-tender; bowel sounds normal; no masses,  no organomegaly Extremities: extremities normal, atraumatic, no cyanosis or edema  ECOG PERFORMANCE STATUS: 1 - Symptomatic but completely ambulatory  Blood pressure (!) 107/49, pulse 72, temperature 98.1 F (36.7 C), temperature source Tympanic, resp. rate 18, height 6' (1.829 m), weight 201 lb 9.6 oz (91.4 kg), SpO2 93  %.  LABORATORY DATA: Lab Results  Component Value Date   WBC 4.2 05/01/2022   HGB 12.7 (L) 05/01/2022   HCT 37.3 (L) 05/01/2022   MCV 93.7 05/01/2022   PLT 162 05/01/2022      Chemistry      Component Value Date/Time   NA 139 05/01/2022 1512   NA  132 (L) 12/31/2019 1625   K 3.9 05/01/2022 1512   CL 100 05/01/2022 1512   CO2 31 05/01/2022 1512   BUN 45 (H) 05/01/2022 1512   BUN 26 12/31/2019 1625   CREATININE 1.54 (H) 05/01/2022 1512   CREATININE 1.03 01/09/2016 1129      Component Value Date/Time   CALCIUM 9.6 05/01/2022 1512   ALKPHOS 81 05/01/2022 1512   AST 17 05/01/2022 1512   ALT 18 05/01/2022 1512   BILITOT 0.6 05/01/2022 1512       RADIOGRAPHIC STUDIES: CT Chest W Contrast  Result Date: 05/02/2022 CLINICAL DATA:  Non-small cell lung cancer staging * Tracking Code: BO * EXAM: CT CHEST WITH CONTRAST TECHNIQUE: Multidetector CT imaging of the chest was performed during intravenous contrast administration. RADIATION DOSE REDUCTION: This exam was performed according to the departmental dose-optimization program which includes automated exposure control, adjustment of the mA and/or kV according to patient size and/or use of iterative reconstruction technique. CONTRAST:  1mL OMNIPAQUE IOHEXOL 300 MG/ML  SOLN COMPARISON:  PET-CT, 12/10/2021, CT chest, 11/19/2021 FINDINGS: Cardiovascular: Aortic atherosclerosis. Normal heart size. Three-vessel coronary artery calcifications. No pericardial effusion. Mediastinum/Nodes: No enlarged mediastinal, hilar, or axillary lymph nodes. Thyroid gland, trachea, and esophagus demonstrate no significant findings. Lungs/Pleura: New, extensive ground-glass and heterogeneous consolidative opacity in the dependent left lower lobe in the general vicinity of a previously seen spiculated nodule in the left lung base. Although difficult to distinguish from adjacent airspace disease, this is most likely diminished in size, measuring approximately 1.1 x  0.6 cm, previously 1.5 x 1.0 cm (series 5, image 91). Significantly increased, now confluent consolidation of the superior segment left lower lobe about a biopsy marking clip, a nodule in this vicinity no longer discretely visualized (series 5, image 45). Similar appearance of suprahilar consolidation and fibrosis of the left upper lobe (series 5, image 41). Tiny, calcified benign nodule of the right lower lobe measuring 0.3 cm (series 5, image 104). No pleural effusion or pneumothorax. Upper Abdomen: No acute abnormality. Musculoskeletal: No chest wall abnormality. No suspicious osseous lesions identified. Calcified exostosis of the posterior right fifth rib (series 5, image 42). IMPRESSION: 1. New, extensive ground-glass and heterogeneous consolidative opacity in the dependent left lower lobe in the general vicinity of a previously seen spiculated nodule in the left lung base. Although difficult to distinguish from adjacent airspace disease, this is most likely diminished in size, consistent with treatment response and developing radiation pneumonitis/fibrosis. 2. Significantly increased, now confluent consolidation of the superior segment left lower lobe about a biopsy marking clip, a nodule in this vicinity no longer discretely visualized. 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 3. Similar appearance of suprahilar consolidation and fibrosis of the left upper lobe, consistent with radiation fibrosis. 4. Coronary artery disease. Aortic Atherosclerosis (ICD10-I70.0). Electronically Signed   By: Delanna Ahmadi M.D.   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 EXAM: MRI HEAD WITHOUT AND WITH CONTRAST TECHNIQUE: Multiplanar, multiecho pulse sequences of the brain and surrounding structures were obtained without and with intravenous contrast. CONTRAST:  34mL GADAVIST GADOBUTROL 1 MMOL/ML IV  SOLN COMPARISON:  Head CT Mar 27, 2022; MRI of the brain July 25, 2021 FINDINGS: Brain: No acute infarction, hemorrhage, hydrocephalus, extra-axial collection or mass lesion. Small areas of encephalomalacia and gliosis in the inferior right temporal lobe and anterior left temporal lobe, likely posttraumatic. Cortical susceptibility artifact  in the right parietal and left frontal region suggested of hemosiderin deposit. Scattered and confluent foci of T2 hyperintensity are seen within the white matter of the cerebral hemispheres, nonspecific, most likely related to chronic small vessel ischemia. No focus of abnormal contrast enhancement to suggest intracranial metastatic disease. Vascular: Normal flow voids. Skull and upper cervical spine: Normal marrow signal. Sinuses/Orbits: Negative. Other: None. IMPRESSION: 1. No evidence of abnormal contrast enhancement to suggest intracranial metastatic disease. 2. Moderate chronic microvascular ischemic changes of the white matter. 3. Unchanged posttraumatic findings. Electronically Signed   By: Pedro Earls M.D.   On: 04/16/2022 14:29    ASSESSMENT AND PLAN: This is a very pleasant 81 years old white male diagnosed with recurrent non-small cell lung cancer, squamous cell carcinoma presented with left lower lobe lung nodule in March 2023.  He was initially diagnosed with stage IIb (T3, N0, M0) non-small cell lung cancer, squamous cell carcinoma in September 2022 1 presented with large left lower lobe lung mass in addition to suspicious left hilar lymphadenopathy. The patient is status post SBRT to the large left lower lobe lung mass in 10 fractions. The patient has been in observation since the initial treatment of radiotherapy. Unfortunately the scan showed new pulmonary nodule in the left lower lobe with spiculated morphology suspicious for metastatic disease.  There was postradiation changes in the previously radiated left lower lobe lung mass  area. The PET scan showed low level hypermetabolism in the left lung posttreatment fibrosis and the new left lower lobe pulmonary nodule also showed low FDG accumulation but still concerning for neoplasm. He had video bronchoscopy with biopsy of the new left lower lobe pulmonary nodule and the final pathology was consistent with squamous cell carcinoma of the lung. The patient has a local disease recurrence.   The patient underwent SBRT to the new left lower lobe lung nodule under the care of Dr. Lisbeth Renshaw completed on January 30, 2022.  He is currently on observation.  He had repeat CT scan of the chest performed recently.  I personally and independently reviewed the scan images and discussed the result and showed the images to the patient and his wife. His scan showed mild decrease in the size of the left lower lobe pulmonary nodule but there was evolving radiation changes around the nodule. I recommended for him to continue on observation with repeat CT scan of the chest in 6 months. For the persistent cough, I will give the patient Medrol Dosepak and he will follow with his primary care physician and pulmonologist for any additional recommendation. The patient was advised to call immediately if he has any other concerning symptoms in the interval.  The patient voices understanding of current disease status and treatment options and is in agreement with the current care plan.  All questions were answered. The patient knows to call the clinic with any problems, questions or concerns. We can certainly see the patient much sooner if necessary.  The total time spent in the appointment was 30 minutes.  Disclaimer: This note was dictated with voice recognition software. Similar sounding words can inadvertently be transcribed and may not be corrected upon review.

## 2022-05-14 DIAGNOSIS — E1140 Type 2 diabetes mellitus with diabetic neuropathy, unspecified: Secondary | ICD-10-CM | POA: Diagnosis not present

## 2022-05-14 DIAGNOSIS — D6869 Other thrombophilia: Secondary | ICD-10-CM | POA: Diagnosis not present

## 2022-05-14 DIAGNOSIS — I25119 Atherosclerotic heart disease of native coronary artery with unspecified angina pectoris: Secondary | ICD-10-CM | POA: Diagnosis not present

## 2022-05-14 DIAGNOSIS — I10 Essential (primary) hypertension: Secondary | ICD-10-CM | POA: Diagnosis not present

## 2022-05-14 DIAGNOSIS — E039 Hypothyroidism, unspecified: Secondary | ICD-10-CM | POA: Diagnosis not present

## 2022-05-14 DIAGNOSIS — G629 Polyneuropathy, unspecified: Secondary | ICD-10-CM | POA: Diagnosis not present

## 2022-05-14 DIAGNOSIS — I480 Paroxysmal atrial fibrillation: Secondary | ICD-10-CM | POA: Diagnosis not present

## 2022-05-14 DIAGNOSIS — C3492 Malignant neoplasm of unspecified part of left bronchus or lung: Secondary | ICD-10-CM | POA: Diagnosis not present

## 2022-05-27 DIAGNOSIS — R238 Other skin changes: Secondary | ICD-10-CM | POA: Diagnosis not present

## 2022-05-27 DIAGNOSIS — M7989 Other specified soft tissue disorders: Secondary | ICD-10-CM | POA: Diagnosis not present

## 2022-05-27 DIAGNOSIS — R29898 Other symptoms and signs involving the musculoskeletal system: Secondary | ICD-10-CM | POA: Diagnosis not present

## 2022-06-10 DIAGNOSIS — E039 Hypothyroidism, unspecified: Secondary | ICD-10-CM | POA: Diagnosis not present

## 2022-06-17 ENCOUNTER — Ambulatory Visit: Payer: Medicare Other | Admitting: Orthopedic Surgery

## 2022-06-17 ENCOUNTER — Other Ambulatory Visit: Payer: Self-pay | Admitting: Interventional Cardiology

## 2022-06-17 DIAGNOSIS — L98421 Non-pressure chronic ulcer of back limited to breakdown of skin: Secondary | ICD-10-CM | POA: Diagnosis not present

## 2022-06-18 ENCOUNTER — Encounter: Payer: Self-pay | Admitting: Orthopedic Surgery

## 2022-06-18 ENCOUNTER — Other Ambulatory Visit: Payer: Self-pay

## 2022-06-18 DIAGNOSIS — E0590 Thyrotoxicosis, unspecified without thyrotoxic crisis or storm: Secondary | ICD-10-CM | POA: Insufficient documentation

## 2022-06-18 DIAGNOSIS — F419 Anxiety disorder, unspecified: Secondary | ICD-10-CM | POA: Insufficient documentation

## 2022-06-18 DIAGNOSIS — R296 Repeated falls: Secondary | ICD-10-CM | POA: Insufficient documentation

## 2022-06-18 DIAGNOSIS — I519 Heart disease, unspecified: Secondary | ICD-10-CM | POA: Insufficient documentation

## 2022-06-18 DIAGNOSIS — K5900 Constipation, unspecified: Secondary | ICD-10-CM | POA: Insufficient documentation

## 2022-06-18 DIAGNOSIS — E039 Hypothyroidism, unspecified: Secondary | ICD-10-CM | POA: Insufficient documentation

## 2022-06-18 DIAGNOSIS — R269 Unspecified abnormalities of gait and mobility: Secondary | ICD-10-CM | POA: Insufficient documentation

## 2022-06-18 DIAGNOSIS — C3492 Malignant neoplasm of unspecified part of left bronchus or lung: Secondary | ICD-10-CM | POA: Insufficient documentation

## 2022-06-18 DIAGNOSIS — M109 Gout, unspecified: Secondary | ICD-10-CM | POA: Insufficient documentation

## 2022-06-18 DIAGNOSIS — S062XAA Diffuse traumatic brain injury with loss of consciousness status unknown, initial encounter: Secondary | ICD-10-CM | POA: Insufficient documentation

## 2022-06-18 DIAGNOSIS — N529 Male erectile dysfunction, unspecified: Secondary | ICD-10-CM | POA: Insufficient documentation

## 2022-06-18 DIAGNOSIS — E1142 Type 2 diabetes mellitus with diabetic polyneuropathy: Secondary | ICD-10-CM | POA: Insufficient documentation

## 2022-06-18 DIAGNOSIS — D6859 Other primary thrombophilia: Secondary | ICD-10-CM | POA: Insufficient documentation

## 2022-06-18 DIAGNOSIS — I5030 Unspecified diastolic (congestive) heart failure: Secondary | ICD-10-CM | POA: Insufficient documentation

## 2022-06-18 DIAGNOSIS — F5101 Primary insomnia: Secondary | ICD-10-CM | POA: Insufficient documentation

## 2022-06-18 DIAGNOSIS — I252 Old myocardial infarction: Secondary | ICD-10-CM | POA: Insufficient documentation
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
## 2022-06-25 ENCOUNTER — Ambulatory Visit: Payer: Medicare Other | Admitting: Orthopedic Surgery

## 2022-06-25 DIAGNOSIS — L98421 Non-pressure chronic ulcer of back limited to breakdown of skin: Secondary | ICD-10-CM | POA: Diagnosis not present

## 2022-06-25 DIAGNOSIS — I87332 Chronic venous hypertension (idiopathic) with ulcer and inflammation of left lower extremity: Secondary | ICD-10-CM

## 2022-06-28 ENCOUNTER — Telehealth: Payer: Self-pay | Admitting: Medical Oncology

## 2022-06-28 NOTE — Telephone Encounter (Signed)
Palliative Care-Wife called [facility] and requested palliative care services to establish goals of care. Is that ok with Dr. Julien Nordmann? Call [place] on Tuesday.

## 2022-07-02 ENCOUNTER — Encounter: Payer: Self-pay | Admitting: Orthopedic Surgery
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
## 2022-07-10 NOTE — Telephone Encounter (Signed)
I have called Langley Gauss w/[facility] back, and LVM for her advising as indicated.

## 2022-07-12 ENCOUNTER — Emergency Department (HOSPITAL_COMMUNITY): Payer: Medicare Other

## 2022-07-12 ENCOUNTER — Emergency Department (HOSPITAL_COMMUNITY)
Admission: EM | Admit: 2022-07-12 | Discharge: 2022-07-13 | Disposition: A | Discharge status: Home / Self Care | Payer: Medicare Other | Attending: Emergency Medicine | Admitting: Emergency Medicine

## 2022-07-12 ENCOUNTER — Encounter (HOSPITAL_COMMUNITY): Payer: Self-pay | Admitting: Emergency Medicine

## 2022-07-12 ENCOUNTER — Telehealth: Payer: Self-pay

## 2022-07-12 DIAGNOSIS — W1839XA Other fall on same level, initial encounter: Secondary | ICD-10-CM | POA: Diagnosis not present

## 2022-07-12 DIAGNOSIS — E1140 Type 2 diabetes mellitus with diabetic neuropathy, unspecified: Secondary | ICD-10-CM | POA: Insufficient documentation

## 2022-07-12 DIAGNOSIS — R519 Headache, unspecified: Secondary | ICD-10-CM | POA: Diagnosis not present

## 2022-07-12 DIAGNOSIS — S40012A Contusion of left shoulder, initial encounter: Secondary | ICD-10-CM | POA: Diagnosis not present

## 2022-07-12 DIAGNOSIS — W19XXXA Unspecified fall, initial encounter: Secondary | ICD-10-CM

## 2022-07-12 DIAGNOSIS — R739 Hyperglycemia, unspecified: Secondary | ICD-10-CM | POA: Diagnosis not present

## 2022-07-12 DIAGNOSIS — Z9889 Other specified postprocedural states: Secondary | ICD-10-CM | POA: Diagnosis not present

## 2022-07-12 DIAGNOSIS — I5042 Chronic combined systolic (congestive) and diastolic (congestive) heart failure: Secondary | ICD-10-CM | POA: Diagnosis not present

## 2022-07-12 DIAGNOSIS — J984 Other disorders of lung: Secondary | ICD-10-CM | POA: Diagnosis not present

## 2022-07-12 DIAGNOSIS — I2510 Atherosclerotic heart disease of native coronary artery without angina pectoris: Secondary | ICD-10-CM | POA: Diagnosis not present

## 2022-07-12 DIAGNOSIS — M89252 Other disorders of bone development and growth, left femur: Secondary | ICD-10-CM | POA: Diagnosis not present

## 2022-07-12 DIAGNOSIS — I110 Hypertensive heart disease with heart failure: Secondary | ICD-10-CM | POA: Insufficient documentation

## 2022-07-12 DIAGNOSIS — M542 Cervicalgia: Secondary | ICD-10-CM | POA: Diagnosis not present

## 2022-07-12 DIAGNOSIS — S4992XA Unspecified injury of left shoulder and upper arm, initial encounter: Secondary | ICD-10-CM | POA: Diagnosis not present

## 2022-07-12 DIAGNOSIS — S0990XA Unspecified injury of head, initial encounter: Secondary | ICD-10-CM | POA: Diagnosis not present

## 2022-07-12 DIAGNOSIS — Z743 Need for continuous supervision: Secondary | ICD-10-CM | POA: Diagnosis not present

## 2022-07-12 DIAGNOSIS — M899 Disorder of bone, unspecified: Secondary | ICD-10-CM | POA: Insufficient documentation

## 2022-07-12 DIAGNOSIS — M1612 Unilateral primary osteoarthritis, left hip: Secondary | ICD-10-CM | POA: Diagnosis not present

## 2022-07-12 DIAGNOSIS — M19012 Primary osteoarthritis, left shoulder: Secondary | ICD-10-CM | POA: Diagnosis not present

## 2022-07-12 DIAGNOSIS — S79912A Unspecified injury of left hip, initial encounter: Secondary | ICD-10-CM | POA: Diagnosis not present

## 2022-07-12 DIAGNOSIS — M25519 Pain in unspecified shoulder: Secondary | ICD-10-CM | POA: Diagnosis not present

## 2022-07-12 DIAGNOSIS — Z8616 Personal history of COVID-19: Secondary | ICD-10-CM | POA: Diagnosis not present

## 2022-07-12 NOTE — Telephone Encounter (Signed)
Attempted to contact patient's spouse Mardene Celeste to schedule a Palliative Care consult appointment. No answer left a message to return call.

## 2022-07-12 NOTE — ED Provider Triage Note (Signed)
Emergency Medicine Provider Triage Evaluation Note  [name] , a 81 y.o. male  was evaluated in triage.  Pt complains of fall that occurred around 10:00 last night.  Patient tripped over an area of flax in his garage as he was waxing his car.  Patient fell onto his left side.  He denies any head injury.  This was an unwitnessed fall.  He is on anticoagulation.  His main area of concern is left shoulder, left hip.  Given mechanism of injury, age, anticoagulation status will obtain CT head, cervical spine  Review of Systems  Positive: As above Negative: As above  Physical Exam  BP 113/75   Pulse 79   Temp 97.9 F (36.6 C) (Oral)   Resp 16   SpO2 99%  Gen:   Awake, no distress   Resp:  Normal effort  MSK:   Moves extremities without difficulty  Other:    Medical Decision Making  Medically screening exam initiated at 8:44 PM.  Appropriate orders placed.  ISAACK PREBLE was informed that the remainder of the evaluation will be completed by another provider, this initial triage assessment does not replace that evaluation, and the importance of remaining in the ED until their evaluation is complete.     Evlyn Courier, PA-C 07/12/22 2045
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
## 2022-07-13 MED ORDER — ACETAMINOPHEN 500 MG PO TABS
1000.0000 mg | ORAL_TABLET | Freq: Once | ORAL | Status: AC
  Administered 2022-07-13: 1000 mg via ORAL
  Filled 2022-07-13: qty 2

## 2022-07-13 NOTE — ED Notes (Signed)
Patient ambulated to the restroom with the assistance of a walker.

## 2022-07-13 NOTE — ED Provider Notes (Signed)
Emergency Department Provider Note   I have reviewed the triage vital signs and the nursing notes.   HISTORY  Chief Complaint Fall   HPI JUSTYCE BABY is a 81 y.o. male past history reviewed below presents emergency department with left shoulder pain after fall today.  Patient had a mechanical fall in his garage.  He states he is waxing his car when someone asked him on the floor causing him to slip.  He landed on his back and left side.  He is having pain in the left shoulder as well as the hip.  He has been able to walk.  No head injury or loss of consciousness.  Denies chest or abdominal pain.  No numbness or weakness. Patient does take Xarelto for PAF.    Past Medical History:  Diagnosis Date   AAA (abdominal aortic aneurysm) ([place])    a. s/p repair 2009.   Acid reflux    takes Prilosec and Protonix daily   Arthritis    BacK   CAD in native artery    a. a. prior inf MI 1993. b. PCI to LAD 05/1997. c. recurrent inferolateral MI complicated by V. fib arrest in 04/1998, prior PCI to OM1. d. 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 Respiratory: Denies shortness of breath. Gastrointestinal: No abdominal pain.  No nausea, no vomiting.  No diarrhea.  No constipation. Genitourinary: Negative for dysuria. Musculoskeletal: Negative for back pain. Positive left shoulder pain and left hip pain.  Skin: Negative for rash. Neurological: Negative for headaches, focal weakness or numbness.   ____________________________________________   PHYSICAL EXAM:  VITAL SIGNS: ED Triage Vitals [07/12/22 2036]  Enc Vitals Group     BP 113/75     Pulse Rate 79     Resp 16     Temp 97.9 F (36.6 C)     Temp Source Oral     SpO2 99 %   Constitutional: Alert and oriented. Well appearing and in no acute distress. Eyes: Conjunctivae are normal.  Head: Atraumatic. Nose: No congestion/rhinnorhea. Mouth/Throat: Mucous membranes are moist.   Neck: No stridor. No cervical spine tenderness to palpation. Cardiovascular: Normal rate, regular rhythm. Good peripheral circulation. Grossly normal heart sounds.   Respiratory: Normal respiratory effort.  No retractions. Lungs CTAB. Gastrointestinal: Soft and  nontender. No distention.  Musculoskeletal: No lower extremity tenderness nor edema.  Normal range of motion of the bilateral hips, knees, ankles.  Tenderness to the left lateral shoulder with limited range of motion there but normal range of motion of the left elbow and wrist.  No scaphoid tenderness. Neurologic:  Normal speech and language. No gross focal neurologic deficits are appreciated.  Skin:  Skin is warm, dry and intact. No rash noted.  No chest, abdomen, flank bruising.  ____________________________________________  RADIOLOGY  CT Head Wo Contrast  Result Date: 07/12/2022 CLINICAL DATA:  Polytrauma, blunt. , trip and fall last night at 2200. Takes xarelto. Denies head injury. C/o L shoulder pain. Denies LOC. EXAM: CT HEAD WITHOUT CONTRAST CT CERVICAL  SPINE WITHOUT CONTRAST TECHNIQUE: Multidetector CT imaging of the head and cervical spine was performed following the standard protocol without intravenous contrast. Multiplanar CT image reconstructions of the cervical spine were also generated. RADIATION DOSE REDUCTION: This exam was performed according to the departmental dose-optimization program which includes automated exposure control, adjustment of the mA and/or kV according to patient size and/or use of iterative reconstruction technique. COMPARISON:  None Available. FINDINGS: CT HEAD FINDINGS BRAIN: BRAIN Cerebral ventricle sizes are concordant with the degree of cerebral volume loss. Patchy and confluent areas of decreased attenuation are noted throughout the deep and periventricular white matter of the cerebral hemispheres bilaterally, compatible with chronic microvascular ischemic disease. No evidence of large-territorial acute infarction. No parenchymal hemorrhage. No mass lesion. No extra-axial collection. No mass effect or midline shift. No hydrocephalus. Basilar cisterns are patent. Vascular: No hyperdense vessel. Atherosclerotic calcifications are present within the cavernous internal carotid and vertebral arteries. Skull: No acute fracture or focal lesion. Sinuses/Orbits: Paranasal sinuses and mastoid air cells are clear. The orbits are unremarkable. Other: Debris noted within the left external auditory canal. CT CERVICAL SPINE FINDINGS Alignment: Grade 1 anterolisthesis of C3 on C4 and C4 on C5. Skull base and vertebrae: Multilevel moderate degenerative change of the spine with associated severe left C3-C4 osseous neural foraminal stenosis. At least moderate to severe left C4-C5 osseous neural foraminal stenosis. No severe osseous central canal stenosis. No acute fracture. No aggressive appearing focal osseous lesion or focal pathologic process. Soft tissues and spinal canal: No prevertebral fluid or swelling. No visible canal hematoma. Upper  chest: Unremarkable. Other: None. IMPRESSION: 1. No acute intracranial abnormality. 2. No acute displaced fracture or traumatic listhesis of the cervical spine. 3. Multilevel moderate degenerative change of the spine with associated severe left C3-C4 osseous neural foraminal stenosis. 4. Left external auditory canal debris. Electronically Signed   By: Iven Finn M.D.   On: 07/12/2022 22:43   CT Cervical Spine Wo Contrast  Result Date: 07/12/2022 CLINICAL DATA:  Polytrauma, blunt. , trip and fall last night at 2200. Takes xarelto. Denies head injury. C/o L shoulder pain. Denies LOC. EXAM: CT HEAD WITHOUT CONTRAST CT CERVICAL SPINE WITHOUT CONTRAST TECHNIQUE: Multidetector CT imaging of the head and cervical spine was performed following the standard protocol without intravenous contrast. Multiplanar CT image reconstructions of the cervical spine were also generated. RADIATION DOSE REDUCTION: This exam was performed according to the departmental dose-optimization program which includes automated exposure control, adjustment of the mA and/or kV according to patient size and/or use of iterative reconstruction technique. COMPARISON:  None Available. FINDINGS: CT HEAD FINDINGS BRAIN: BRAIN Cerebral ventricle sizes are concordant with the degree of cerebral volume loss. Patchy and confluent areas of decreased attenuation are noted throughout the deep and periventricular  white matter of the cerebral hemispheres bilaterally, compatible with chronic microvascular ischemic disease. No evidence of large-territorial acute infarction. No parenchymal hemorrhage. No mass lesion. No extra-axial collection. No mass effect or midline shift. No hydrocephalus. Basilar cisterns are patent. Vascular: No hyperdense vessel. Atherosclerotic calcifications are present within the cavernous internal carotid and vertebral arteries. Skull: No acute fracture or focal lesion. Sinuses/Orbits: Paranasal sinuses and mastoid air cells are  clear. The orbits are unremarkable. Other: Debris noted within the left external auditory canal. CT CERVICAL SPINE FINDINGS Alignment: Grade 1 anterolisthesis of C3 on C4 and C4 on C5. Skull base and vertebrae: Multilevel moderate degenerative change of the spine with associated severe left C3-C4 osseous neural foraminal stenosis. At least moderate to severe left C4-C5 osseous neural foraminal stenosis. No severe osseous central canal stenosis. No acute fracture. No aggressive appearing focal osseous lesion or focal pathologic process. Soft tissues and spinal canal: No prevertebral fluid or swelling. No visible canal hematoma. Upper chest: Unremarkable. Other: None. IMPRESSION: 1. No acute intracranial abnormality. 2. No acute displaced fracture or traumatic listhesis of the cervical spine. 3. Multilevel moderate degenerative change of the spine with associated severe left C3-C4 osseous neural foraminal stenosis. 4. Left external auditory canal debris. Electronically Signed   By: Iven Finn M.D.   On: 07/12/2022 22:43   DG Hip Unilat W or Wo Pelvis 2-3 Views Left  Result Date: 07/12/2022 CLINICAL DATA:  Recent trip and fall yesterday with persistent hip pain, initial encounter EXAM: DG HIP (WITH OR WITHOUT PELVIS) 2-3V LEFT COMPARISON:  None Available. FINDINGS: Postsurgical changes are noted in the lower lumbar spine. Pelvic ring is intact. Degenerative changes of the hip joints are noted bilaterally. No acute fracture is noted. Some lucency and endosteal scalloping is noted in the proximal left femoral shaft suspicious for metastatic disease. IMPRESSION: No acute abnormality is noted. Lucency in the proximal femoral shaft as described suspicious for metastatic disease. Nonemergent nuclear bone scan would be helpful for further evaluation. Electronically Signed   By: Inez Catalina M.D.   On: 07/12/2022 21:18   DG Shoulder Left  Result Date: 07/12/2022 CLINICAL DATA:  Trip and fall yesterday with  persistent left shoulder pain, initial encounter EXAM: LEFT SHOULDER - 2+ VIEW COMPARISON:  None Available. FINDINGS: Degenerative changes of the acromioclavicular joint are seen. No acute fracture or dislocation is noted. Fiducial markers are noted in the left lung with increased parenchymal density consistent with the known clinical history of lung carcinoma. No abnormality in the bony thorax is seen. IMPRESSION: Degenerative change without acute abnormality. Electronically Signed   By: Inez Catalina M.D.   On: 07/12/2022 21:13    ____________________________________________   PROCEDURES  Procedure(s) performed:   Procedures  None  ____________________________________________   INITIAL IMPRESSION / ASSESSMENT AND PLAN / ED COURSE  Pertinent labs & imaging results that were available during my care of the patient were reviewed by me and considered in my medical decision making (see chart for details).   This patient is Presenting for Evaluation of fall, which does require a range of treatment options, and is a complaint that involves a moderate risk of morbidity and mortality.  The Differential Diagnoses includes subdural hematoma, epidural hematoma, acute concussion, traumatic subarachnoid hemorrhage, cerebral contusions, etc.   Critical Interventions-    Medications  acetaminophen (TYLENOL) tablet 1,000 mg (1,000 mg Oral Given 07/13/22 0442)    Reassessment after intervention: Pain improved.    I decided to review pertinent External Data, and  in summary patient taking Xarelto for PAF.    Radiologic Tests Ordered, included CT head, c-spine, shoulder x-ray and hip x-ray. I independently interpreted the images and agree with radiology interpretation.   Cardiac Monitor Tracing which shows NSR.   Social Determinants of Health Risk no active smoking.   Medical Decision Making: Summary:  Presents to the emergency department evaluation after mechanical fall.  He is  anticoagulated.  CT imaging with no acute findings.  Left shoulder with limited range of motion, likely contusion but no dislocation or fracture seen on plain films.  Did discuss the incidental finding on the patient's left hip x-ray of possible metastatic lesion.  He does have history of lung cancer and is following with oncology with plan for PET scan in the coming week. He will mention this finding to them and I will list this on the AVS as well.   Disposition: discharge  ____________________________________________  FINAL CLINICAL IMPRESSION(S) / ED DIAGNOSES  Final diagnoses:  Fall, initial encounter  Contusion of left shoulder, initial encounter  Lytic bone lesion of femur     Note:  This document was prepared using Dragon voice recognition software and may include unintentional dictation errors.  Nanda Quinton, MD, [facility] Emergency Medicine    Alishea Beaudin, [facility], MD 07/13/22 [phone]

## 2022-07-13 NOTE — Discharge Instructions (Signed)
You are seen emergency room today after a fall.  Your x-rays did not show any broken bones or internal bleeding.  Your x-ray of the left femur did show an area of lucency which could be consistent with a metastatic cancer lesion but this needs further follow-up.  Please mention this to your primary care physician and oncology team.  They may want to perform additional bone scans to either confirm or refute this finding.

## 2022-07-15 DIAGNOSIS — K219 Gastro-esophageal reflux disease without esophagitis: Secondary | ICD-10-CM | POA: Diagnosis not present

## 2022-07-15 DIAGNOSIS — E039 Hypothyroidism, unspecified: Secondary | ICD-10-CM | POA: Diagnosis not present

## 2022-07-15 DIAGNOSIS — E7800 Pure hypercholesterolemia, unspecified: Secondary | ICD-10-CM | POA: Diagnosis not present

## 2022-07-15 DIAGNOSIS — E1140 Type 2 diabetes mellitus with diabetic neuropathy, unspecified: Secondary | ICD-10-CM | POA: Diagnosis not present

## 2022-07-15 DIAGNOSIS — I10 Essential (primary) hypertension: Secondary | ICD-10-CM | POA: Diagnosis not present

## 2022-07-15 DIAGNOSIS — I4891 Unspecified atrial fibrillation: Secondary | ICD-10-CM | POA: Diagnosis not present

## 2022-07-17 DIAGNOSIS — I252 Old myocardial infarction: Secondary | ICD-10-CM | POA: Diagnosis not present

## 2022-07-17 DIAGNOSIS — I25119 Atherosclerotic heart disease of native coronary artery with unspecified angina pectoris: Secondary | ICD-10-CM | POA: Diagnosis not present

## 2022-07-17 DIAGNOSIS — I110 Hypertensive heart disease with heart failure: Secondary | ICD-10-CM | POA: Diagnosis not present

## 2022-07-17 DIAGNOSIS — I5042 Chronic combined systolic (congestive) and diastolic (congestive) heart failure: Secondary | ICD-10-CM | POA: Diagnosis not present

## 2022-07-17 DIAGNOSIS — L89152 Pressure ulcer of sacral region, stage 2: Secondary | ICD-10-CM | POA: Diagnosis not present

## 2022-07-18 ENCOUNTER — Telehealth: Payer: Self-pay

## 2022-07-18 DIAGNOSIS — C3492 Malignant neoplasm of unspecified part of left bronchus or lung: Secondary | ICD-10-CM | POA: Diagnosis not present

## 2022-07-18 DIAGNOSIS — C7951 Secondary malignant neoplasm of bone: Secondary | ICD-10-CM | POA: Diagnosis not present

## 2022-07-18 DIAGNOSIS — N1831 Chronic kidney disease, stage 3a: Secondary | ICD-10-CM | POA: Diagnosis not present

## 2022-07-18 NOTE — Telephone Encounter (Signed)
Attempted to contact patient/patient's spouse [name] to schedule a Palliative Care consult appointment. No answer left a message to return call.

## 2022-07-19 DIAGNOSIS — L89152 Pressure ulcer of sacral region, stage 2: Secondary | ICD-10-CM | POA: Diagnosis not present

## 2022-07-19 DIAGNOSIS — I110 Hypertensive heart disease with heart failure: Secondary | ICD-10-CM | POA: Diagnosis not present

## 2022-07-19 DIAGNOSIS — I252 Old myocardial infarction: Secondary | ICD-10-CM | POA: Diagnosis not present

## 2022-07-19 DIAGNOSIS — I5042 Chronic combined systolic (congestive) and diastolic (congestive) heart failure: Secondary | ICD-10-CM | POA: Diagnosis not present

## 2022-07-19 DIAGNOSIS — I25119 Atherosclerotic heart disease of native coronary artery with unspecified angina pectoris: Secondary | ICD-10-CM | POA: Diagnosis not present

## 2022-07-22 DIAGNOSIS — I25119 Atherosclerotic heart disease of native coronary artery with unspecified angina pectoris: Secondary | ICD-10-CM | POA: Diagnosis not present

## 2022-07-22 DIAGNOSIS — L89152 Pressure ulcer of sacral region, stage 2: Secondary | ICD-10-CM | POA: Diagnosis not present

## 2022-07-22 DIAGNOSIS — I110 Hypertensive heart disease with heart failure: Secondary | ICD-10-CM | POA: Diagnosis not present

## 2022-07-22 DIAGNOSIS — I252 Old myocardial infarction: Secondary | ICD-10-CM | POA: Diagnosis not present

## 2022-07-22 DIAGNOSIS — I5042 Chronic combined systolic (congestive) and diastolic (congestive) heart failure: Secondary | ICD-10-CM | POA: Diagnosis not present

## 2022-07-22 IMAGING — DX DG CHEST 1V PORT
2 series · 2 of 2 positions shown · non-contrast
Comparison: None.

CLINICAL DATA: Cough.  Weakness.

EXAM:
PORTABLE CHEST 1 VIEW

[chest ap (1 of 2)]
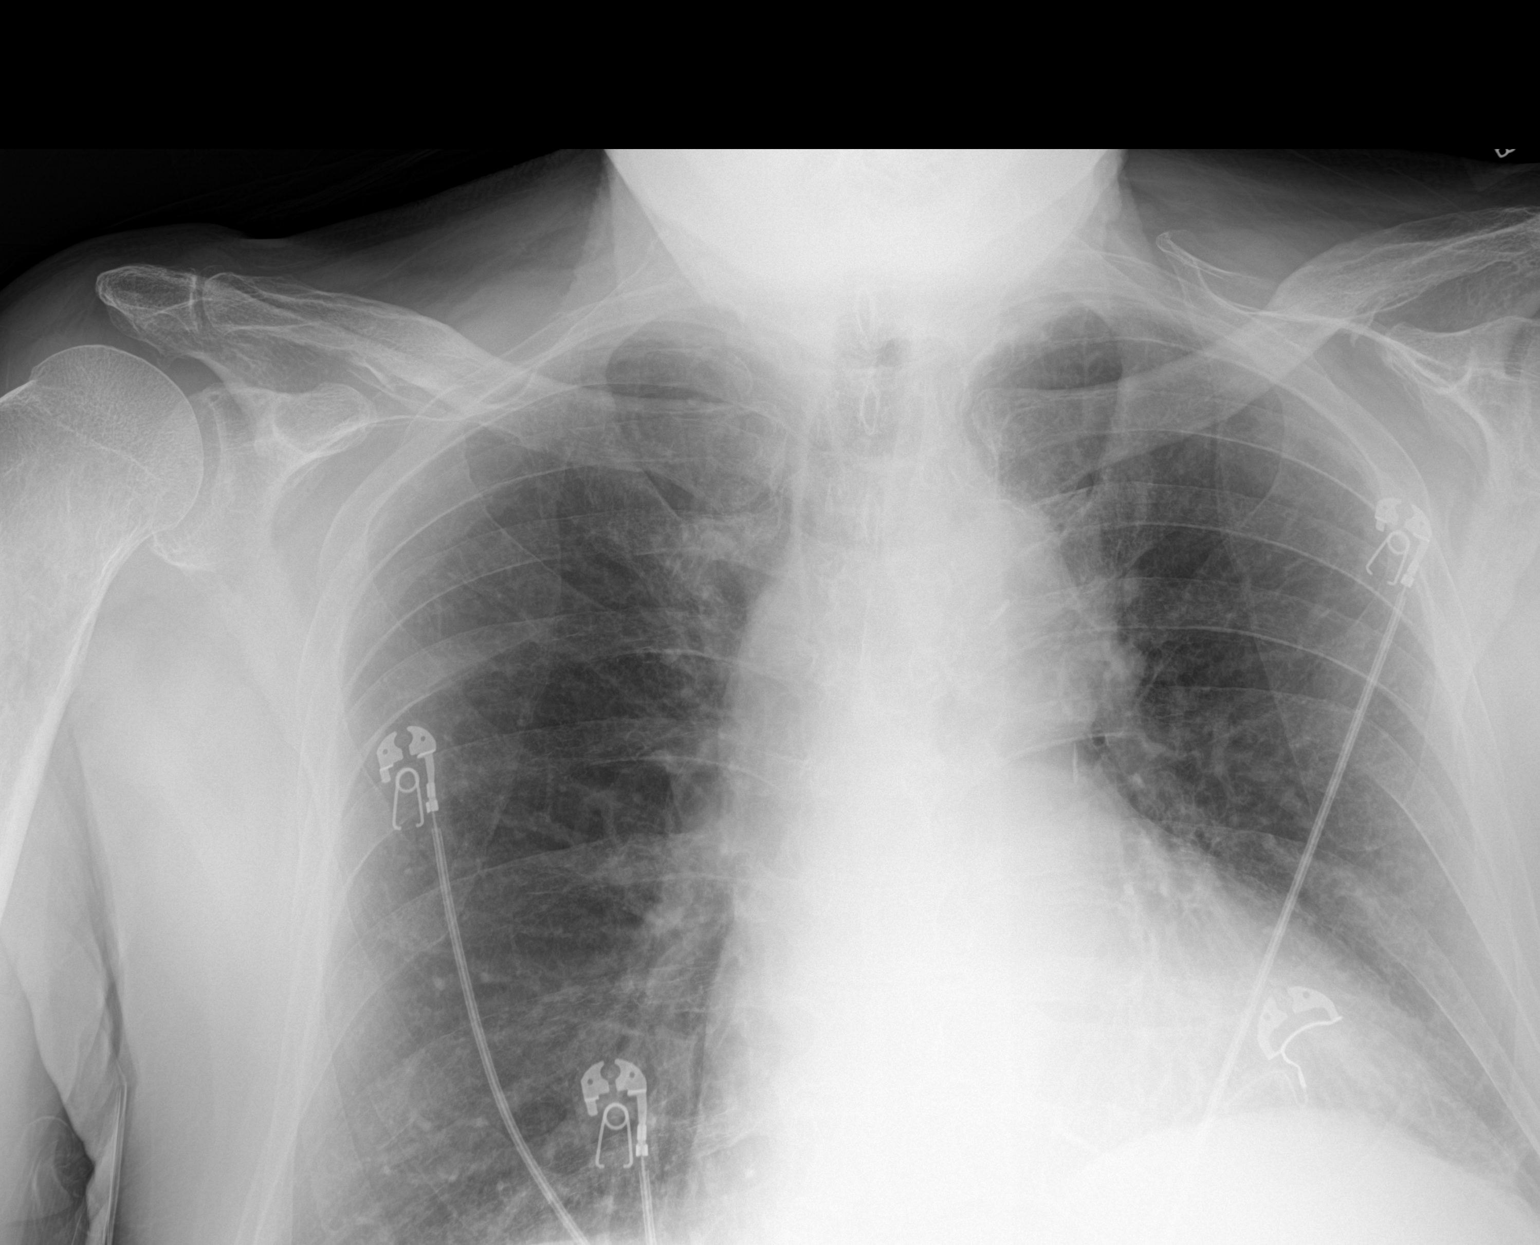

[chest ap (2 of 2)]
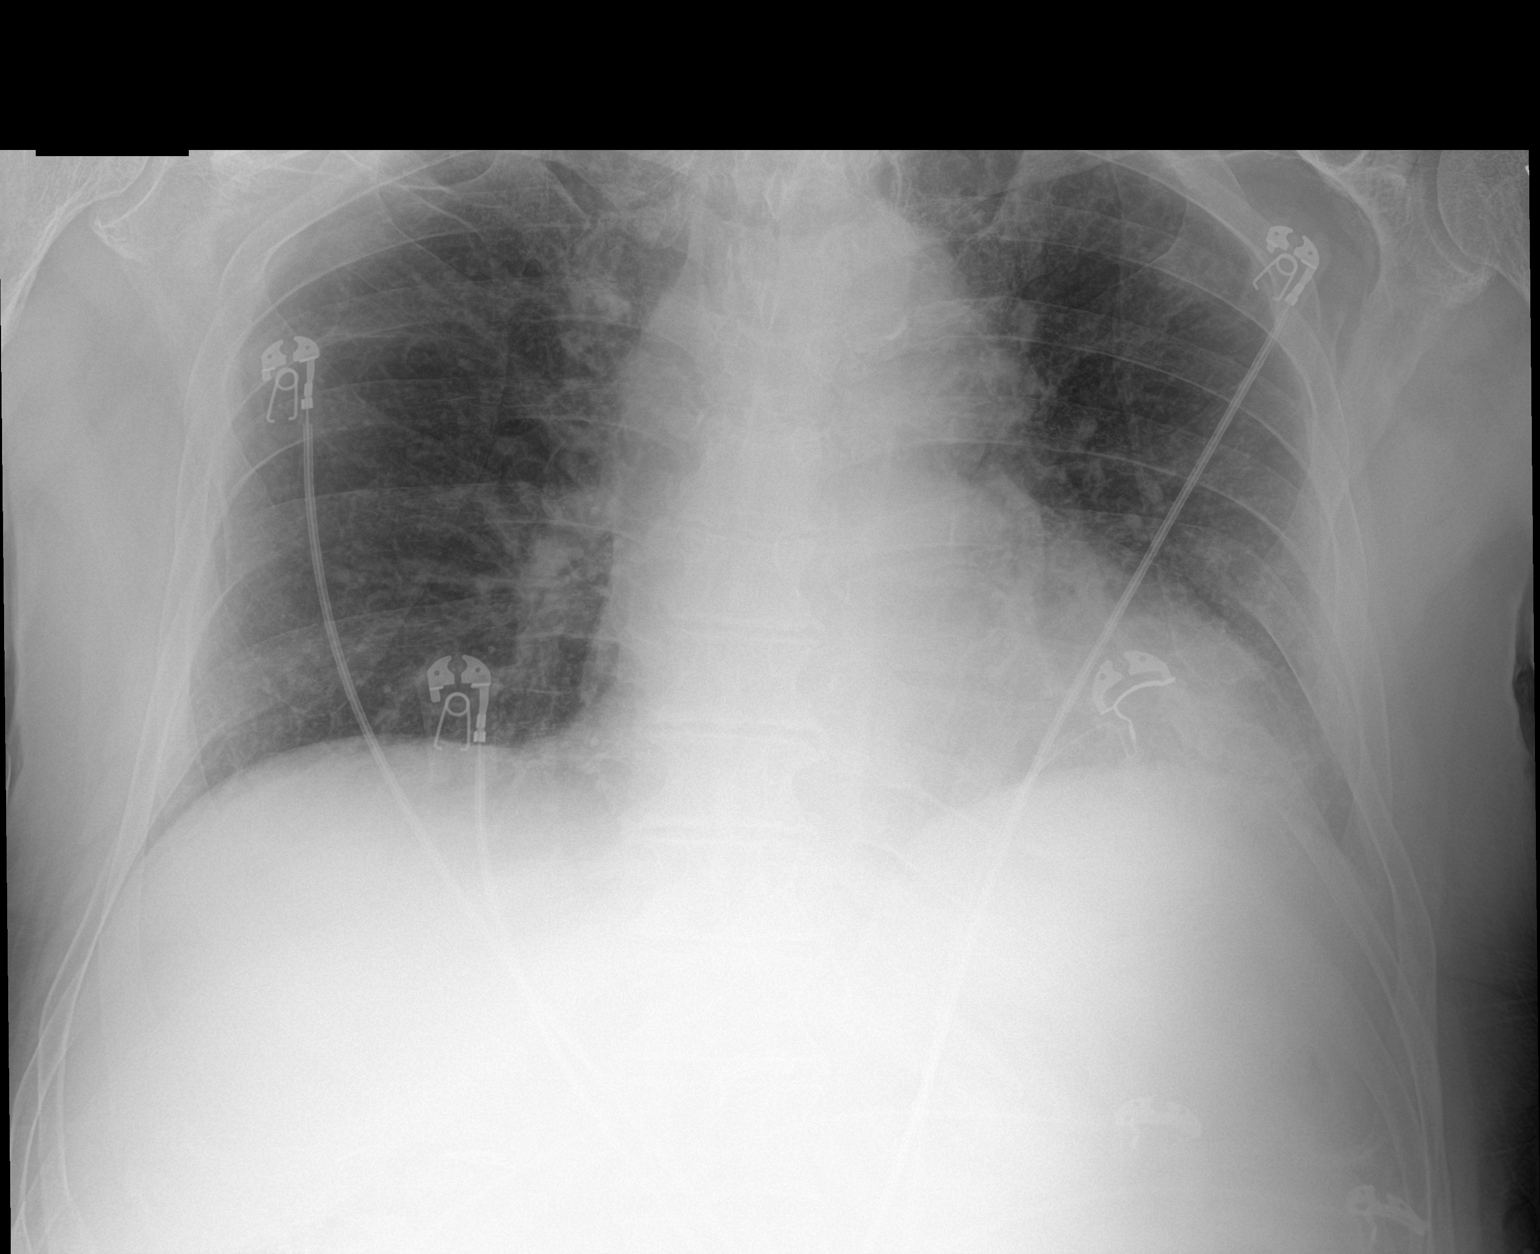

[2 of 2 positions shown; findings below may reference images not displayed]

FINDINGS: 6941 hours. Low lung volumes. There is pulmonary vascular congestion
without overt pulmonary edema. Interstitial markings are diffusely
coarsened with chronic features. Interstitial opacity at the left
base is similar to prior given lower lung volumes, likely chronic.
Telemetry leads overlie the chest.
IMPRESSION: Low volume film with pulmonary vascular congestion and underlying
chronic interstitial disease.

## 2022-07-22 IMAGING — CT CT HEAD W/O CM
3 series · 16 of 47 positions shown, 19 images · non-contrast
Comparison: 07/04/2019

CLINICAL DATA: Weakness, confusion

EXAM:
CT HEAD WITHOUT CONTRAST
TECHNIQUE: Contiguous axial images were obtained from the base of the skull
through the vertex without intravenous contrast.

[Series 2: head wo · axial · 0.42mm/px · z∈[-108,+22]mm · 10 of 32 slices shown, 13 images]
[im 3/32  brain]
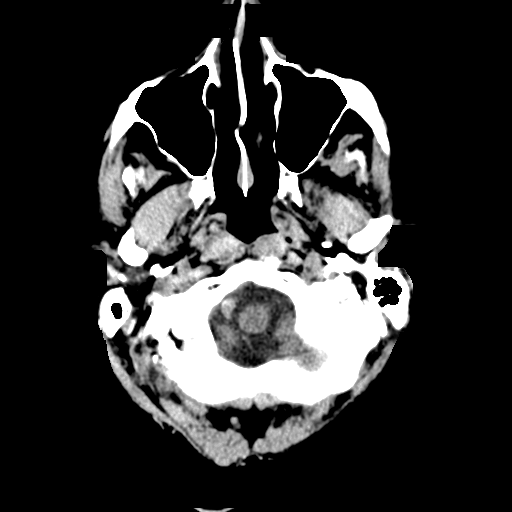
[im 3/32  bone]
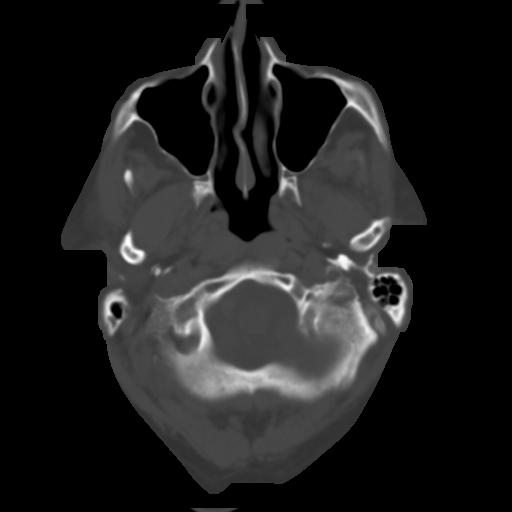
[im 6/32  brain]
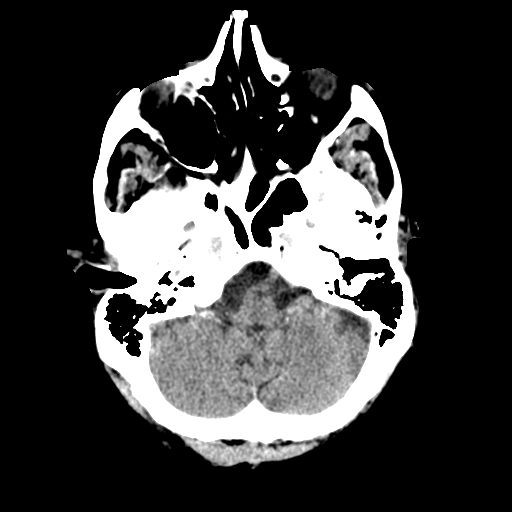
[im 9/32  brain]
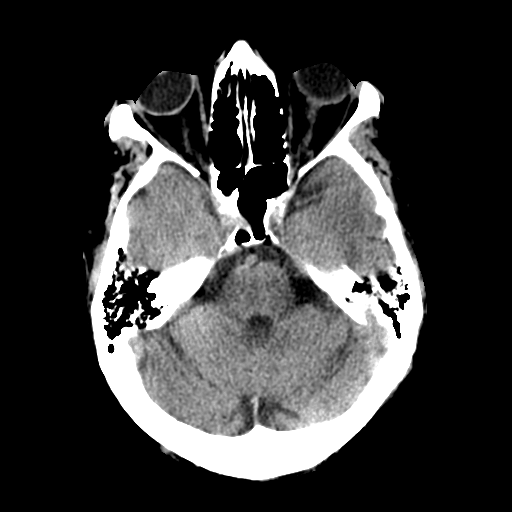
[im 11/32  brain]
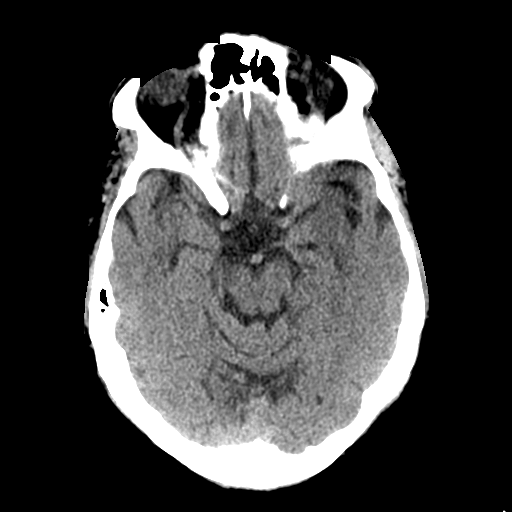
[im 14/32  brain]
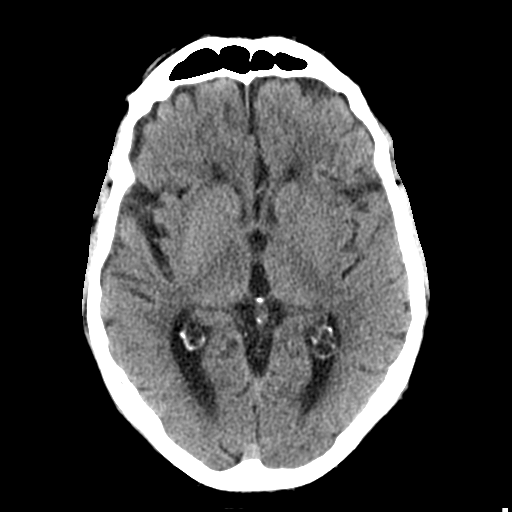
[im 14/32  bone]
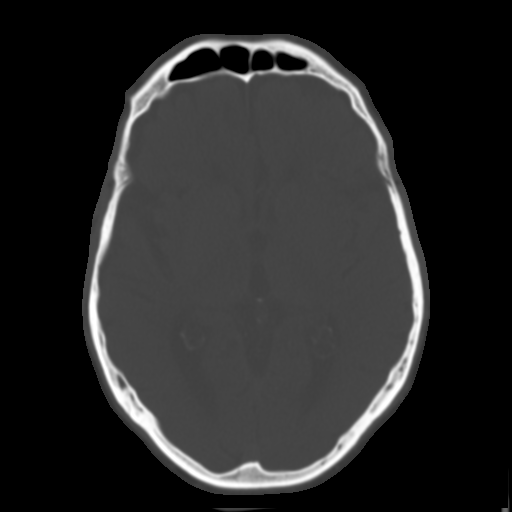
[im 18/32  brain]
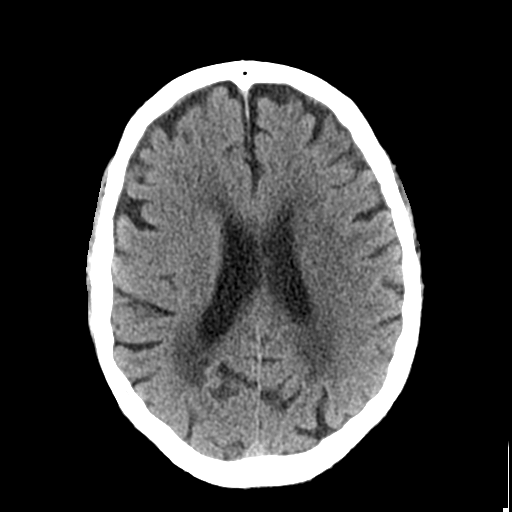
[im 21/32  brain]
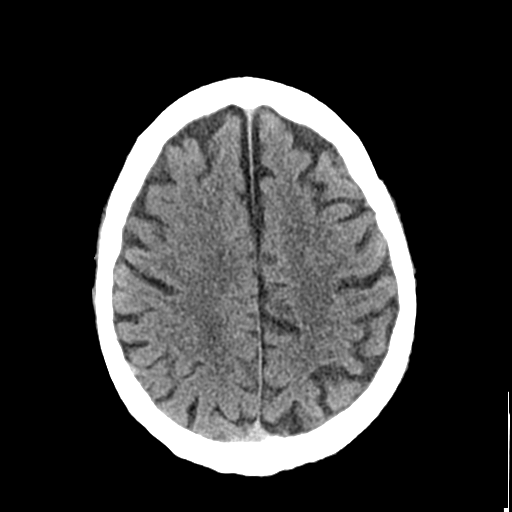
[im 24/32  brain]
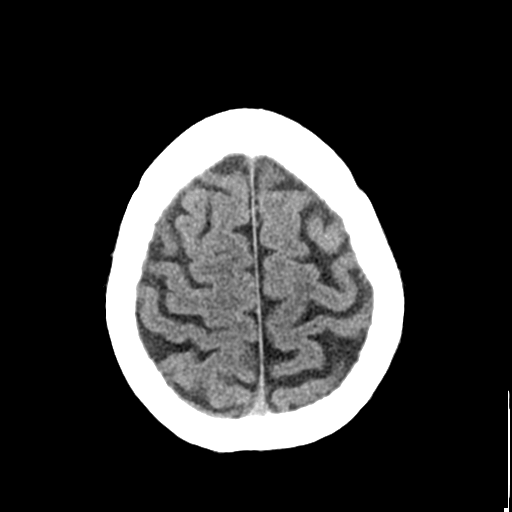
[im 26/32  brain]
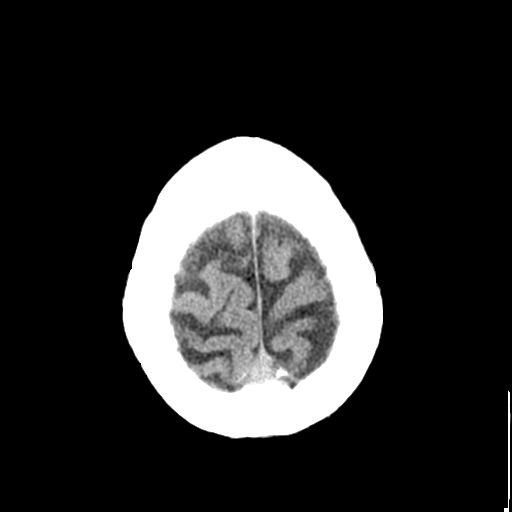
[im 26/32  bone]
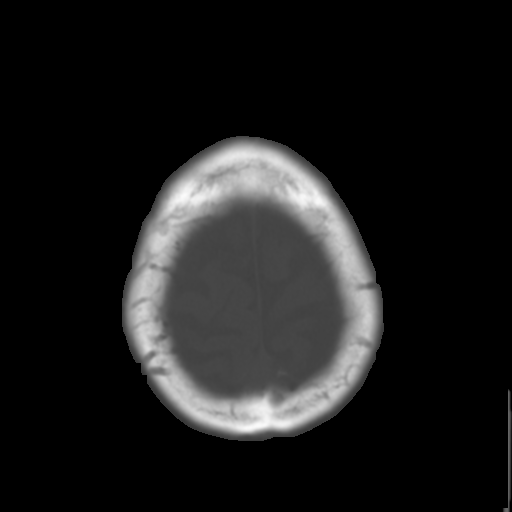
[im 29/32  brain]
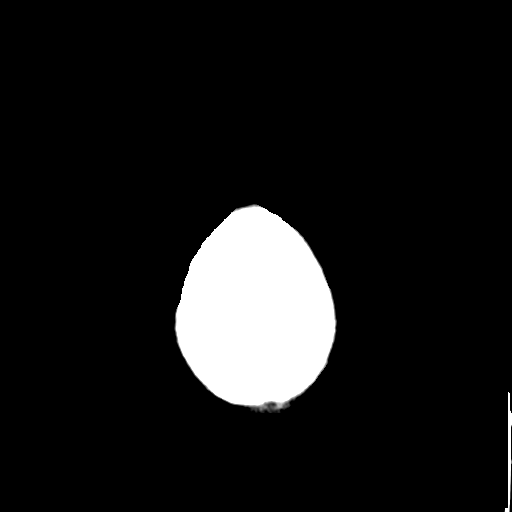

[Series 5: coronal soft tissue · coronal · 0.31mm/px · 3 of 66 slices shown]
[im 22/66  brain]
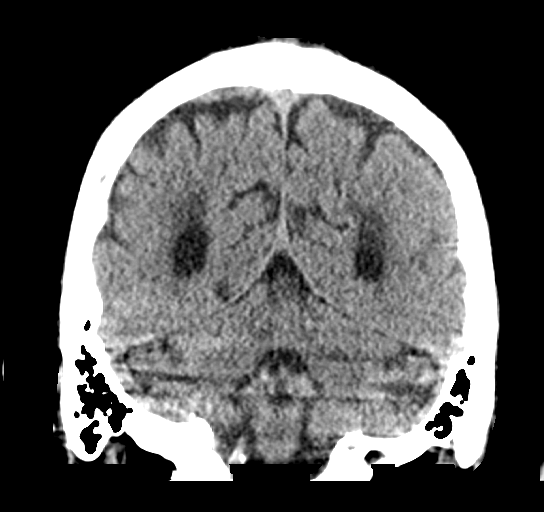
[im 29/66  brain]
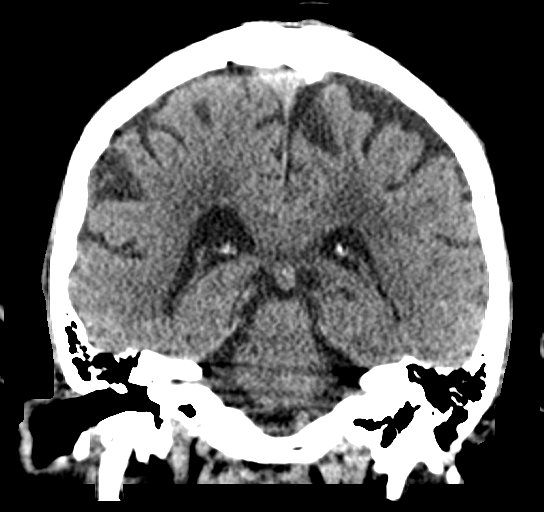
[im 37/66  brain]
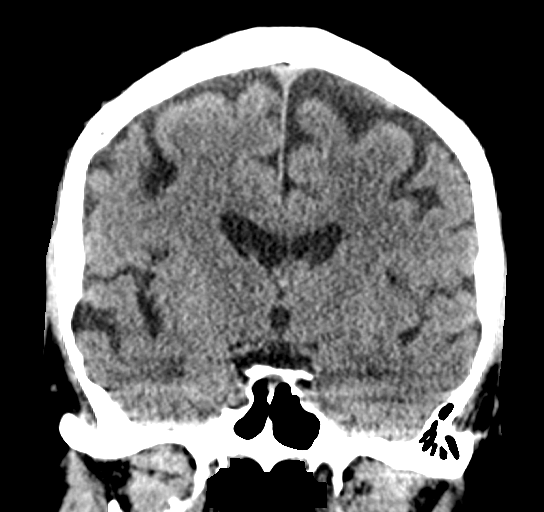

[Series 6: sagittal soft tissue · sagittal · 0.31mm/px · 3 of 57 slices shown]
[im 19/57  brain]
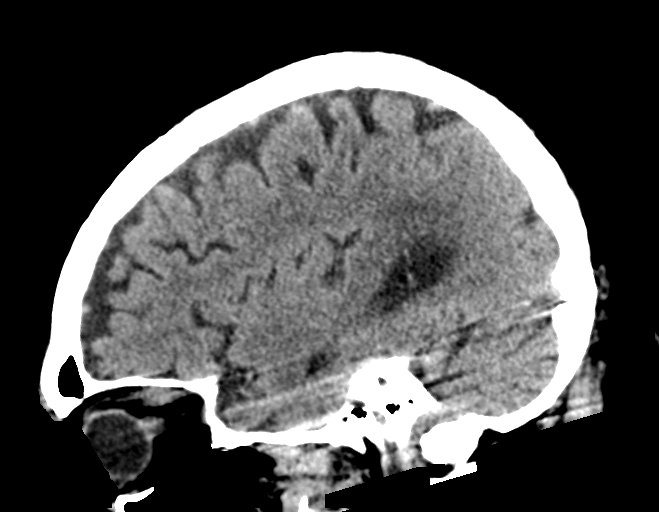
[im 29/57  brain]
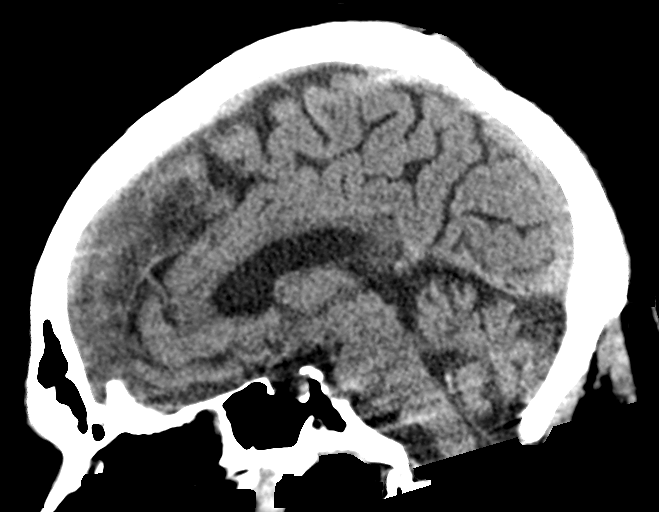
[im 38/57  brain]
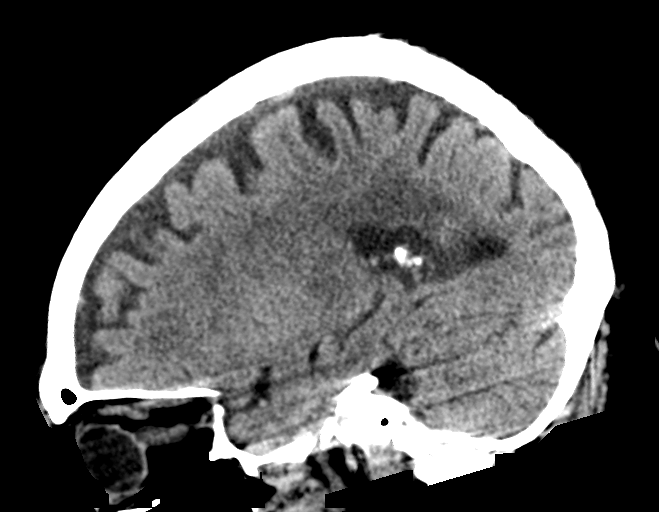

[16 of 47 positions shown; findings below may reference images not displayed]

FINDINGS: Brain: No evidence of acute infarction, hemorrhage, hydrocephalus,
extra-axial collection or mass lesion/mass effect. Moderate
low-density changes within the periventricular and subcortical white
matter compatible with chronic microvascular ischemic change. Mild
diffuse cerebral volume loss.

Vascular: Atherosclerotic calcifications involving the large vessels
of the skull base. No unexpected hyperdense vessel.

Skull: Normal. Negative for fracture or focal lesion.

Sinuses/Orbits: No acute finding.

Other: None.
IMPRESSION: 1. No acute intracranial findings.
2. Chronic microvascular ischemic change and cerebral volume loss.

## 2022-07-23 ENCOUNTER — Ambulatory Visit: Payer: Medicare Other | Admitting: Orthopedic Surgery

## 2022-07-24 ENCOUNTER — Telehealth: Payer: Self-pay

## 2022-07-24 DIAGNOSIS — I110 Hypertensive heart disease with heart failure: Secondary | ICD-10-CM | POA: Diagnosis not present

## 2022-07-24 DIAGNOSIS — I252 Old myocardial infarction: Secondary | ICD-10-CM | POA: Diagnosis not present

## 2022-07-24 DIAGNOSIS — I25119 Atherosclerotic heart disease of native coronary artery with unspecified angina pectoris: Secondary | ICD-10-CM | POA: Diagnosis not present

## 2022-07-24 DIAGNOSIS — L89152 Pressure ulcer of sacral region, stage 2: Secondary | ICD-10-CM | POA: Diagnosis not present

## 2022-07-24 DIAGNOSIS — I5042 Chronic combined systolic (congestive) and diastolic (congestive) heart failure: Secondary | ICD-10-CM | POA: Diagnosis not present

## 2022-07-24 NOTE — Telephone Encounter (Signed)
Not my patient. Please send to PCP.

## 2022-07-24 NOTE — Telephone Encounter (Signed)
Spoke with patient regarding Palliative Care services. He declined services at this time. Will cancel referral and notify referring provider.

## 2022-07-25 ENCOUNTER — Ambulatory Visit: Payer: Medicare Other | Admitting: Orthopedic Surgery

## 2022-07-25 DIAGNOSIS — L98421 Non-pressure chronic ulcer of back limited to breakdown of skin: Secondary | ICD-10-CM

## 2022-07-25 DIAGNOSIS — M25512 Pain in left shoulder: Secondary | ICD-10-CM | POA: Diagnosis not present

## 2022-07-26 ENCOUNTER — Telehealth: Payer: Self-pay | Admitting: Family

## 2022-07-26 DIAGNOSIS — I252 Old myocardial infarction: Secondary | ICD-10-CM | POA: Diagnosis not present

## 2022-07-26 DIAGNOSIS — I25119 Atherosclerotic heart disease of native coronary artery with unspecified angina pectoris: Secondary | ICD-10-CM | POA: Diagnosis not present

## 2022-07-26 DIAGNOSIS — I110 Hypertensive heart disease with heart failure: Secondary | ICD-10-CM | POA: Diagnosis not present

## 2022-07-26 DIAGNOSIS — I5042 Chronic combined systolic (congestive) and diastolic (congestive) heart failure: Secondary | ICD-10-CM | POA: Diagnosis not present

## 2022-07-26 DIAGNOSIS — L89152 Pressure ulcer of sacral region, stage 2: Secondary | ICD-10-CM | POA: Diagnosis not present

## 2022-07-26 NOTE — Telephone Encounter (Signed)
Called to advise HHN and she advised that the pt is having increased bilateral leg weakness and that he was trying to get into the bed and pt could not lift his leg and fell was on the floor all night long because the husband refused to let his wife call 911. Doing less over all and his wife is hurting herself trying to get the pt up and help him. HHN states that she would like to have an xray of his back and thinks that he may need to have an MRI. Pt trusts Dr. Sharol Given and would like for you to discuss with pt at next visit.

## 2022-07-26 NOTE — Telephone Encounter (Signed)
Langley Gauss Investment banker, [employer])  from [facility] called requesting a call back. She states pt fell 2 wks ago and then again last night and pt slept on floor due to fall and not being able to get up. Langley Gauss is asking for a call back  to discuss plan of care. Please call Langley Gauss at [phone].

## 2022-07-26 NOTE — Telephone Encounter (Signed)
You saw this pt in the office yesterday for sacral wound. Had been telling to cut up vive sock  and use this as dressing. [facility]N calling to see if that order has changed. Please advise.

## 2022-07-29 ENCOUNTER — Encounter: Payer: Self-pay | Admitting: Orthopedic Surgery

## 2022-07-29 DIAGNOSIS — I25119 Atherosclerotic heart disease of native coronary artery with unspecified angina pectoris: Secondary | ICD-10-CM | POA: Diagnosis not present

## 2022-07-29 DIAGNOSIS — I110 Hypertensive heart disease with heart failure: Secondary | ICD-10-CM | POA: Diagnosis not present

## 2022-07-29 DIAGNOSIS — M25512 Pain in left shoulder: Secondary | ICD-10-CM | POA: Diagnosis not present

## 2022-07-29 DIAGNOSIS — L89152 Pressure ulcer of sacral region, stage 2: Secondary | ICD-10-CM | POA: Diagnosis not present

## 2022-07-29 DIAGNOSIS — I5042 Chronic combined systolic (congestive) and diastolic (congestive) heart failure: Secondary | ICD-10-CM | POA: Diagnosis not present

## 2022-07-29 DIAGNOSIS — I252 Old myocardial infarction: Secondary | ICD-10-CM | POA: Diagnosis not present

## 2022-07-29 MED ORDER — METHYLPREDNISOLONE ACETATE 40 MG/ML IJ SUSP
40.0000 mg | INTRAMUSCULAR | Status: AC | PRN
  Administered 2022-07-29: 40 mg via INTRA_ARTICULAR

## 2022-07-29 MED ORDER — LIDOCAINE HCL 1 % IJ SOLN
5.0000 mL | INTRAMUSCULAR | Status: AC | PRN
  Administered 2022-07-29: 5 mL
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
## 2022-07-29 NOTE — Telephone Encounter (Signed)
I called pt to see if we could se tup an appt for Tuesday. LM ON VM to advise to call the office. Pt's HHN wants xray of back s/p fall.

## 2022-07-30 NOTE — Telephone Encounter (Signed)
Called pt and went directly to VM left a message to advise that I am trying to make an appt for follow up per his HHN to xray his back s/p fall. I have not been successful in trying to reach this pt but have left several messages. I will hold and try again and also try to reach home health nurse to advise.

## 2022-07-31 ENCOUNTER — Telehealth: Payer: Self-pay | Admitting: Orthopedic Surgery

## 2022-07-31 DIAGNOSIS — I5042 Chronic combined systolic (congestive) and diastolic (congestive) heart failure: Secondary | ICD-10-CM | POA: Diagnosis not present

## 2022-07-31 DIAGNOSIS — I110 Hypertensive heart disease with heart failure: Secondary | ICD-10-CM | POA: Diagnosis not present

## 2022-07-31 DIAGNOSIS — L89152 Pressure ulcer of sacral region, stage 2: Secondary | ICD-10-CM | POA: Diagnosis not present

## 2022-07-31 DIAGNOSIS — I252 Old myocardial infarction: Secondary | ICD-10-CM | POA: Diagnosis not present

## 2022-07-31 DIAGNOSIS — I25119 Atherosclerotic heart disease of native coronary artery with unspecified angina pectoris: Secondary | ICD-10-CM | POA: Diagnosis not present

## 2022-07-31 NOTE — Telephone Encounter (Signed)
Updates on another message. Will sign off on this. Pt's wife to [facility] and advise if they are able to come in for an appt on Friday with Erin at 10 am if so I will open the appt time.

## 2022-07-31 NOTE — Telephone Encounter (Signed)
Patient's wife called. Returning a call to have husband seen this week. Her call back number is [phone]

## 2022-07-31 NOTE — Telephone Encounter (Signed)
I called back and lm on vm to advise calling to make appt with Erin for Friday. I can open an appt time on Friday at 10 am to call and confirm if they are able to make that appt. If so then to advise whomever answers the phone for the office and I can open that spot.

## 2022-08-01 DIAGNOSIS — I25119 Atherosclerotic heart disease of native coronary artery with unspecified angina pectoris: Secondary | ICD-10-CM | POA: Diagnosis not present

## 2022-08-01 DIAGNOSIS — I5042 Chronic combined systolic (congestive) and diastolic (congestive) heart failure: Secondary | ICD-10-CM | POA: Diagnosis not present

## 2022-08-01 DIAGNOSIS — I110 Hypertensive heart disease with heart failure: Secondary | ICD-10-CM | POA: Diagnosis not present

## 2022-08-01 DIAGNOSIS — I252 Old myocardial infarction: Secondary | ICD-10-CM | POA: Diagnosis not present

## 2022-08-01 DIAGNOSIS — L89152 Pressure ulcer of sacral region, stage 2: Secondary | ICD-10-CM | POA: Diagnosis not present

## 2022-08-02 ENCOUNTER — Ambulatory Visit (INDEPENDENT_AMBULATORY_CARE_PROVIDER_SITE_OTHER): Payer: Medicare Other

## 2022-08-02 ENCOUNTER — Ambulatory Visit (INDEPENDENT_AMBULATORY_CARE_PROVIDER_SITE_OTHER): Payer: Medicare Other | Admitting: Family

## 2022-08-02 DIAGNOSIS — M25512 Pain in left shoulder: Secondary | ICD-10-CM | POA: Diagnosis not present

## 2022-08-02 DIAGNOSIS — M545 Low back pain, unspecified: Secondary | ICD-10-CM
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
## 2022-08-05 ENCOUNTER — Inpatient Hospital Stay: Payer: Medicare Other | Attending: Internal Medicine | Admitting: Internal Medicine

## 2022-08-07 DIAGNOSIS — I110 Hypertensive heart disease with heart failure: Secondary | ICD-10-CM | POA: Diagnosis not present

## 2022-08-07 DIAGNOSIS — I5042 Chronic combined systolic (congestive) and diastolic (congestive) heart failure: Secondary | ICD-10-CM | POA: Diagnosis not present

## 2022-08-07 DIAGNOSIS — L89152 Pressure ulcer of sacral region, stage 2: Secondary | ICD-10-CM | POA: Diagnosis not present

## 2022-08-07 DIAGNOSIS — I25119 Atherosclerotic heart disease of native coronary artery with unspecified angina pectoris: Secondary | ICD-10-CM | POA: Diagnosis not present

## 2022-08-07 DIAGNOSIS — I252 Old myocardial infarction: Secondary | ICD-10-CM | POA: Diagnosis not present

## 2022-08-09 DIAGNOSIS — I25119 Atherosclerotic heart disease of native coronary artery with unspecified angina pectoris: Secondary | ICD-10-CM | POA: Diagnosis not present

## 2022-08-09 DIAGNOSIS — L89152 Pressure ulcer of sacral region, stage 2: Secondary | ICD-10-CM | POA: Diagnosis not present

## 2022-08-09 DIAGNOSIS — I5042 Chronic combined systolic (congestive) and diastolic (congestive) heart failure: Secondary | ICD-10-CM | POA: Diagnosis not present

## 2022-08-09 DIAGNOSIS — I110 Hypertensive heart disease with heart failure: Secondary | ICD-10-CM | POA: Diagnosis not present

## 2022-08-09 DIAGNOSIS — I252 Old myocardial infarction: Secondary | ICD-10-CM | POA: Diagnosis not present

## 2022-08-13 DIAGNOSIS — I25119 Atherosclerotic heart disease of native coronary artery with unspecified angina pectoris: Secondary | ICD-10-CM | POA: Diagnosis not present

## 2022-08-13 DIAGNOSIS — I5042 Chronic combined systolic (congestive) and diastolic (congestive) heart failure: Secondary | ICD-10-CM | POA: Diagnosis not present

## 2022-08-13 DIAGNOSIS — I110 Hypertensive heart disease with heart failure: Secondary | ICD-10-CM | POA: Diagnosis not present

## 2022-08-13 DIAGNOSIS — L89152 Pressure ulcer of sacral region, stage 2: Secondary | ICD-10-CM | POA: Diagnosis not present

## 2022-08-13 DIAGNOSIS — I252 Old myocardial infarction: Secondary | ICD-10-CM | POA: Diagnosis not present

## 2022-08-15 DIAGNOSIS — I252 Old myocardial infarction: Secondary | ICD-10-CM | POA: Diagnosis not present

## 2022-08-15 DIAGNOSIS — L89152 Pressure ulcer of sacral region, stage 2: Secondary | ICD-10-CM | POA: Diagnosis not present

## 2022-08-15 DIAGNOSIS — I25119 Atherosclerotic heart disease of native coronary artery with unspecified angina pectoris: Secondary | ICD-10-CM | POA: Diagnosis not present

## 2022-08-15 DIAGNOSIS — I5042 Chronic combined systolic (congestive) and diastolic (congestive) heart failure: Secondary | ICD-10-CM | POA: Diagnosis not present

## 2022-08-15 DIAGNOSIS — I110 Hypertensive heart disease with heart failure: Secondary | ICD-10-CM | POA: Diagnosis not present

## 2022-08-18 ENCOUNTER — Other Ambulatory Visit: Payer: Medicare Other

## 2022-08-21 DIAGNOSIS — I5042 Chronic combined systolic (congestive) and diastolic (congestive) heart failure: Secondary | ICD-10-CM | POA: Diagnosis not present

## 2022-08-21 DIAGNOSIS — L89152 Pressure ulcer of sacral region, stage 2: Secondary | ICD-10-CM | POA: Diagnosis not present

## 2022-08-21 DIAGNOSIS — I25119 Atherosclerotic heart disease of native coronary artery with unspecified angina pectoris: Secondary | ICD-10-CM | POA: Diagnosis not present

## 2022-08-21 DIAGNOSIS — I252 Old myocardial infarction: Secondary | ICD-10-CM | POA: Diagnosis not present

## 2022-08-21 DIAGNOSIS — I110 Hypertensive heart disease with heart failure: Secondary | ICD-10-CM | POA: Diagnosis not present

## 2022-08-22 ENCOUNTER — Ambulatory Visit: Payer: Medicare Other | Admitting: Orthopedic Surgery

## 2022-08-27 ENCOUNTER — Encounter: Payer: Self-pay | Admitting: Family

## 2022-08-28 ENCOUNTER — Telehealth: Payer: Self-pay | Admitting: *Deleted

## 2022-08-28 DIAGNOSIS — I252 Old myocardial infarction: Secondary | ICD-10-CM | POA: Diagnosis not present

## 2022-08-28 DIAGNOSIS — I25119 Atherosclerotic heart disease of native coronary artery with unspecified angina pectoris: Secondary | ICD-10-CM | POA: Diagnosis not present

## 2022-08-28 DIAGNOSIS — L89152 Pressure ulcer of sacral region, stage 2: Secondary | ICD-10-CM | POA: Diagnosis not present

## 2022-08-28 DIAGNOSIS — I5042 Chronic combined systolic (congestive) and diastolic (congestive) heart failure: Secondary | ICD-10-CM | POA: Diagnosis not present

## 2022-08-28 DIAGNOSIS — I110 Hypertensive heart disease with heart failure: Secondary | ICD-10-CM | POA: Diagnosis not present

## 2022-08-28 NOTE — Telephone Encounter (Signed)
Pt left vm stating he had canceled the appt for MRI shoulder back in 10/22 because he was feeling better, but now states has gotten worse again and would like to reschedule appt. I called pt back left vm stating he can contact [facility] at [phone] to reschedule appt.

## 2022-08-29 ENCOUNTER — Encounter: Payer: Self-pay | Admitting: Internal Medicine

## 2022-08-29 ENCOUNTER — Other Ambulatory Visit: Payer: Self-pay

## 2022-08-29 ENCOUNTER — Inpatient Hospital Stay: Payer: Medicare Other | Attending: Internal Medicine | Admitting: Internal Medicine

## 2022-08-29 VITALS — BP 115/58 | HR 84 | Temp 97.7°F | Resp 16 | Wt 191.1 lb

## 2022-08-29 DIAGNOSIS — R059 Cough, unspecified: Secondary | ICD-10-CM | POA: Diagnosis not present

## 2022-08-29 DIAGNOSIS — I25119 Atherosclerotic heart disease of native coronary artery with unspecified angina pectoris: Secondary | ICD-10-CM | POA: Diagnosis not present

## 2022-08-29 DIAGNOSIS — Z923 Personal history of irradiation: Secondary | ICD-10-CM | POA: Diagnosis not present

## 2022-08-29 DIAGNOSIS — I110 Hypertensive heart disease with heart failure: Secondary | ICD-10-CM | POA: Diagnosis not present

## 2022-08-29 DIAGNOSIS — Z79899 Other long term (current) drug therapy: Secondary | ICD-10-CM | POA: Diagnosis not present

## 2022-08-29 DIAGNOSIS — G629 Polyneuropathy, unspecified: Secondary | ICD-10-CM | POA: Diagnosis not present

## 2022-08-29 DIAGNOSIS — C3432 Malignant neoplasm of lower lobe, left bronchus or lung: Secondary | ICD-10-CM | POA: Insufficient documentation

## 2022-08-29 DIAGNOSIS — I5042 Chronic combined systolic (congestive) and diastolic (congestive) heart failure: Secondary | ICD-10-CM | POA: Diagnosis not present

## 2022-08-29 DIAGNOSIS — R5383 Other fatigue: Secondary | ICD-10-CM | POA: Insufficient documentation

## 2022-08-29 DIAGNOSIS — C3490 Malignant neoplasm of unspecified part of unspecified bronchus or lung: Secondary | ICD-10-CM

## 2022-08-29 DIAGNOSIS — I252 Old myocardial infarction: Secondary | ICD-10-CM | POA: Diagnosis not present

## 2022-08-29 DIAGNOSIS — L89152 Pressure ulcer of sacral region, stage 2: Secondary | ICD-10-CM | POA: Diagnosis not present

## 2022-08-29 NOTE — Progress Notes (Signed)
[place] [phone]   [phone]) [phone]  OFFICE PROGRESS NOTE  [name], MD 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  The patient had recurrent disease in the form of left lower lobe lung nodule that biopsy-proven to be squamous cell carcinoma in March 2023  PRIOR THERAPY:  1) Status post SBRT to the large left lower lobe lung mass under the care of Dr. Lisbeth Renshaw completed August 17, 2021. 2) status post SBRT to another left lower lobe lung nodule under the care of Dr. Lisbeth Renshaw completed on 01/30/2022  CURRENT THERAPY: Observation  INTERVAL HISTORY: [name] 81 y.o. male returns to the clinic today for follow-up visit accompanied by his wife.  The patient is feeling fine today with no concerning complaints.  He has some pain in the left hip area and he was seen by his orthopedic surgeon and had x-ray of the right hip that showed lucency in the proximal femoral shaft suspicious for metastatic disease.  He was concerned about his disease and requested a follow-up for further evaluation and discussion of his condition.  He denied having any chest pain but continues to have cough productive of brownish sputum with no shortness of breath or hemoptysis.  He has no nausea, vomiting, diarrhea or constipation.  He has no headache or visual changes.  MEDICAL HISTORY: Past Medical History:  Diagnosis Date   AAA (abdominal aortic aneurysm) ([place])    a. s/p repair 2009.   Acid reflux    takes Prilosec and Protonix daily   Arthritis    BacK   CAD in native artery    a. a. prior inf MI 1993. b. PCI to LAD 05/1997. c. recurrent inferolateral MI  complicated by V. fib arrest in 04/1998, prior PCI to OM1. d. 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   Cancer (HCC)    basal and squamous cell   Chronic combined systolic and diastolic CHF (congestive heart failure) (HCC)    Complication of anesthesia    had an ileus after a couple of surgeries   COVID    Depression    over 30 years ago   Dry skin    Encephalitis    Foot drop, bilateral    Hypercholesteremia    Hypertension    Ileus (HCC)    After AAA   Incisional hernia    "small, from AAA"   Ischemic cardiomyopathy    MI (myocardial infarction) ([place])    Neuromuscular disorder ([place])    neuropathy in feet   Neuropathy    in feet & legs   OSA on CPAP    uses CPAP--sleep study done at least 79yrs ago   PAF (paroxysmal atrial fibrillation) (HCC)    Pain    back pain chronic- seen at pain clinic   Plantar fasciitis    bilatetral   Pneumonia    Thrombocytopenia (HCC)    Type II diabetes mellitus (HCC)     ALLERGIES:  is allergic to cymbalta [duloxetine hcl], neurontin [gabapentin], atorvastatin, ciprofloxacin, keppra [levetiracetam], pioglitazone, and simvastatin.  MEDICATIONS:  Current Outpatient Medications  Medication Sig Dispense Refill   ACCU-CHEK SMARTVIEW test strip CHECK 1 TO 2 TIMES  A DAY     albuterol (VENTOLIN HFA) 108 (90 Base) MCG/ACT inhaler 1-2 puff as needed     amiodarone (PACERONE) 200 MG tablet TAKE 1 TABLET BY MOUTH AT BEDTIME 90 tablet 1   amitriptyline (ELAVIL) 25 MG tablet Take 25 mg by mouth at bedtime.     atorvastatin (LIPITOR) 10 MG tablet Take 10 mg by mouth in the morning.     Cholecalciferol (VITAMIN D3) 50 MCG (2000 UT) TABS Take 2,000 Units by mouth daily.     Coenzyme Q10 (COQ10) 200 MG CAPS Take 200 mg by mouth in the morning.     Cyanocobalamin (B-12) 5000 MCG CAPS Take 5,000 mcg by mouth daily. 30 capsule 0   famotidine (PEPCID) 20 MG tablet Take 20 mg by mouth daily as needed for heartburn or indigestion.     finasteride (PROPECIA) 1 MG  tablet Take 1 mg by mouth daily.     furosemide (LASIX) 40 MG tablet Take 1 tablet (40 mg total) by mouth daily as needed (fluid retention/edema).     glimepiride (AMARYL) 2 MG tablet Take 3 tablets (6 mg total) by mouth daily with breakfast. 90 tablet 0   JARDIANCE 25 MG TABS tablet Take 25 mg by mouth every evening.     lactose free nutrition (BOOST) LIQD Take 237 mLs by mouth in the morning.     levothyroxine (SYNTHROID) 100 MCG tablet Take 100 mcg by mouth every morning.     levothyroxine (SYNTHROID) 112 MCG tablet Take 112 mcg by mouth every morning.     Lidocaine HCl 4 % CREA Apply 1 application topically 4 (four) times daily as needed (pain.).     metFORMIN (GLUCOPHAGE-XR) 500 MG 24 hr tablet Take 500-1,000 mg by mouth See admin instructions. Take 2 tablets (1000 mg) by mouth in the morning & take 1 tablet (500 mg) by mouth in the evening.     methylPREDNISolone (MEDROL DOSEPAK) 4 MG TBPK tablet Use as instructed 21 tablet 0   metoprolol tartrate (LOPRESSOR) 25 MG tablet Take 1 tablet (25 mg total) by mouth 2 (two) times daily. 180 tablet 3   mirtazapine (REMERON) 15 MG tablet Take 1 tablet (15 mg total) by mouth at bedtime. 30 tablet 0   Multiple Vitamin (MULTIVITAMIN WITH MINERALS) TABS tablet Take 1 tablet by mouth daily.     ondansetron (ZOFRAN ODT) 4 MG disintegrating tablet Take 1 tablet (4 mg total) by mouth every 8 (eight) hours as needed for nausea or vomiting. (Patient not taking: Reported on 05/08/2022) 20 tablet 0   ondansetron (ZOFRAN) 4 MG tablet Take 4 mg by mouth every 6 (six) hours as needed. (Patient not taking: Reported on 05/08/2022)     oxyCODONE-acetaminophen (PERCOCET) 7.5-325 MG tablet Take 1-2 tablets by mouth at bedtime as needed (pain.).     OZEMPIC, 0.25 OR 0.5 MG/DOSE, 2 MG/1.5ML SOPN Inject 0.5 mg into the skin every Saturday.     pregabalin (LYRICA) 300 MG capsule Take 300 mg by mouth 2 (two) times daily.     RELION PEN NEEDLES 32G X 4 MM MISC      rivaroxaban  (XARELTO) 20 MG TABS tablet Take 1 tablet (20 mg total) by mouth every evening. OK to restart this medication on 01/01/2022 30 tablet 0   senna-docusate (SENOKOT-S) 8.6-50 MG tablet Take 1 tablet by mouth daily as needed (constipation.).     traZODone (DESYREL) 50 MG tablet Take 50 mg by mouth at bedtime.     No  current facility-administered medications for this visit.    SURGICAL HISTORY:  Past Surgical History:  Procedure Laterality Date   ABDOMINAL AORTIC ANEURYSM REPAIR     BACK SURGERY  2012-2013 X 3   Miminal Invasive x 3in [place].   BRONCHIAL BIOPSY  07/09/2021   Procedure: BRONCHIAL BIOPSIES;  Surgeon: Collene Gobble, MD;  Location: [facility] ENDOSCOPY;  Service: Pulmonary;;   BRONCHIAL BIOPSY  12/31/2021   Procedure: BRONCHIAL BIOPSIES;  Surgeon: Collene Gobble, MD;  Location: [facility] ENDOSCOPY;  Service: Pulmonary;;   BRONCHIAL BRUSHINGS  07/09/2021   Procedure: BRONCHIAL BRUSHINGS;  Surgeon: Collene Gobble, MD;  Location: [facility] ENDOSCOPY;  Service: Pulmonary;;   BRONCHIAL BRUSHINGS  12/31/2021   Procedure: BRONCHIAL BRUSHINGS;  Surgeon: Collene Gobble, MD;  Location: [facility] ENDOSCOPY;  Service: Pulmonary;;   BRONCHIAL NEEDLE ASPIRATION BIOPSY  07/09/2021   Procedure: BRONCHIAL NEEDLE ASPIRATION BIOPSIES;  Surgeon: Collene Gobble, MD;  Location: [facility] ENDOSCOPY;  Service: Pulmonary;;   BRONCHIAL NEEDLE ASPIRATION BIOPSY  12/31/2021   Procedure: BRONCHIAL NEEDLE ASPIRATION BIOPSIES;  Surgeon: Collene Gobble, MD;  Location: [facility] ENDOSCOPY;  Service: Pulmonary;;   BRONCHIAL WASHINGS  12/31/2021   Procedure: BRONCHIAL WASHINGS;  Surgeon: Collene Gobble, MD;  Location: [place];  Service: Pulmonary;;   CARDIAC CATHETERIZATION  2009/2012   CARDIOVERSION N/A 04/12/2015   Procedure: CARDIOVERSION;  Surgeon: Sueanne Margarita, MD;  Location: [place];  Service: Cardiovascular;  Laterality: N/A;   CARDIOVERSION N/A 12/26/2016   Procedure: CARDIOVERSION;  Surgeon: Sueanne Margarita, MD;  Location: [place];   Service: Cardiovascular;  Laterality: N/A;   COLONOSCOPY     CORONARY ANGIOPLASTY WITH [place]; 1997   FIDUCIAL MARKER PLACEMENT  07/09/2021   Procedure: FIDUCIAL MARKER PLACEMENT;  Surgeon: Collene Gobble, MD;  Location: [place];  Service: Pulmonary;;   FIDUCIAL MARKER PLACEMENT  12/31/2021   Procedure: FIDUCIAL MARKER PLACEMENT;  Surgeon: Collene Gobble, MD;  Location: [facility] ENDOSCOPY;  Service: Pulmonary;;   LAPAROSCOPIC CHOLECYSTECTOMY     LUMBAR LAMINECTOMY/DECOMPRESSION MICRODISCECTOMY Right 01/16/2016   Procedure: Right Lumbar five-Sacral one Laminectomy;  Surgeon: Kristeen Miss, MD;  Location: [facility] NEURO ORS;  Service: Neurosurgery;  Laterality: Right;  Right L5-S1 Laminectomy   VIDEO BRONCHOSCOPY WITH ENDOBRONCHIAL NAVIGATION N/A 07/09/2021   Procedure: ROBOTIC VIDEO BRONCHOSCOPY WITH ENDOBRONCHIAL NAVIGATION;  Surgeon: Collene Gobble, MD;  Location: [place] ENDOSCOPY;  Service: Pulmonary;  Laterality: N/A;   VIDEO BRONCHOSCOPY WITH RADIAL ENDOBRONCHIAL ULTRASOUND  07/09/2021   Procedure: VIDEO BRONCHOSCOPY WITH RADIAL ENDOBRONCHIAL ULTRASOUND;  Surgeon: Collene Gobble, MD;  Location: [facility] ENDOSCOPY;  Service: Pulmonary;;   VIDEO BRONCHOSCOPY WITH RADIAL ENDOBRONCHIAL ULTRASOUND  12/31/2021   Procedure: VIDEO BRONCHOSCOPY WITH RADIAL ENDOBRONCHIAL ULTRASOUND;  Surgeon: Collene Gobble, MD;  Location: [facility] ENDOSCOPY;  Service: Pulmonary;;    REVIEW OF SYSTEMS:  Constitutional: positive for fatigue Eyes: negative Ears, nose, mouth, throat, and face: negative Respiratory: positive for cough and sputum Cardiovascular: negative Gastrointestinal: negative Genitourinary:negative Integument/breast: negative Hematologic/lymphatic: negative Musculoskeletal:positive for arthralgias Neurological: negative Behavioral/Psych: negative Endocrine: negative Allergic/Immunologic: negative   PHYSICAL EXAMINATION: General appearance: alert, cooperative, fatigued, and no distress Head:  Normocephalic, without obvious abnormality, atraumatic Neck: no adenopathy, no JVD, supple, symmetrical, trachea midline, and thyroid not enlarged, symmetric, no tenderness/mass/nodules Lymph nodes: Cervical, supraclavicular, and axillary nodes normal. Resp: clear to auscultation bilaterally Back: symmetric, no curvature. ROM normal. No CVA tenderness. Cardio: regular rate and rhythm, S1, S2 normal, no murmur, click, rub or gallop GI: soft,  non-tender; bowel sounds normal; no masses,  no organomegaly Extremities: extremities normal, atraumatic, no cyanosis or edema Neurologic: Alert and oriented X 3, normal strength and tone. Normal symmetric reflexes. Normal coordination and gait  ECOG PERFORMANCE STATUS: 1 - Symptomatic but completely ambulatory  Blood pressure (!) 115/58, pulse 84, temperature 97.7 F (36.5 C), resp. rate 16, weight 191 lb 1.6 oz (86.7 kg), SpO2 97 %.  LABORATORY DATA: Lab Results  Component Value Date   WBC 4.2 05/01/2022   HGB 12.7 (L) 05/01/2022   HCT 37.3 (L) 05/01/2022   MCV 93.7 05/01/2022   PLT 162 05/01/2022      Chemistry      Component Value Date/Time   NA 139 05/01/2022 1512   NA 132 (L) 12/31/2019 1625   K 3.9 05/01/2022 1512   CL 100 05/01/2022 1512   CO2 31 05/01/2022 1512   BUN 45 (H) 05/01/2022 1512   BUN 26 12/31/2019 1625   CREATININE 1.54 (H) 05/01/2022 1512   CREATININE 1.03 01/09/2016 1129      Component Value Date/Time   CALCIUM 9.6 05/01/2022 1512   ALKPHOS 81 05/01/2022 1512   AST 17 05/01/2022 1512   ALT 18 05/01/2022 1512   BILITOT 0.6 05/01/2022 1512       RADIOGRAPHIC STUDIES: XR Lumbar Spine 2-3 Views  Result Date: 08/27/2022 Radiographs of lumbar spine show stable alignment of L3-L5 fusion with no sign of hardware failure. Spondylosis seen throughout. No fracture seen. No spondylolisthesis.   ASSESSMENT AND PLAN: This is a very pleasant 81 years old white male diagnosed with recurrent non-small cell lung cancer,  squamous cell carcinoma presented with left lower lobe lung nodule in March 2023.  He was initially diagnosed with stage IIb (T3, N0, M0) non-small cell lung cancer, squamous cell carcinoma in September 2022 1 presented with large left lower lobe lung mass in addition to suspicious left hilar lymphadenopathy. The patient is status post SBRT to the large left lower lobe lung mass in 10 fractions. The patient has been in observation since the initial treatment of radiotherapy. Unfortunately the scan showed new pulmonary nodule in the left lower lobe with spiculated morphology suspicious for metastatic disease.  There was postradiation changes in the previously radiated left lower lobe lung mass area. The PET scan showed low level hypermetabolism in the left lung posttreatment fibrosis and the new left lower lobe pulmonary nodule also showed low FDG accumulation but still concerning for neoplasm. He had video bronchoscopy with biopsy of the new left lower lobe pulmonary nodule and the final pathology was consistent with squamous cell carcinoma of the lung. The patient has a local disease recurrence.   The patient underwent SBRT to the new left lower lobe lung nodule under the care of Dr. Lisbeth Renshaw completed on January 30, 2022.   The patient is currently on observation but was found on x-ray of the left femur to have a suspicious metastatic lesion. I had a lengthy discussion with the patient and his wife about his condition and further investigation to rule out any metastatic disease. I recommended for the patient to have repeat PET scan for further evaluation of his disease and to rule out any metastasis to the left femur or progression of the lung nodules. I will see him back for follow-up visit in 3 weeks for evaluation and discussion of his treatment options. The patient was advised to call immediately if he has any other concerning symptoms in the interval.  The patient voices understanding  of current  disease status and treatment options and is in agreement with the current care plan.  All questions were answered. The patient knows to call the clinic with any problems, questions or concerns. We can certainly see the patient much sooner if necessary.  The total time spent in the appointment was 30 minutes.  Disclaimer: This note was dictated with voice recognition software. Similar sounding words can inadvertently be transcribed and may not be corrected upon review.

## 2022-09-04 ENCOUNTER — Telehealth: Payer: Self-pay | Admitting: Internal Medicine

## 2022-09-04 NOTE — Telephone Encounter (Signed)
Called patient regarding upcoming November appointments, left a voicemail.

## 2022-09-05 DIAGNOSIS — I25119 Atherosclerotic heart disease of native coronary artery with unspecified angina pectoris: Secondary | ICD-10-CM | POA: Diagnosis not present

## 2022-09-05 DIAGNOSIS — I110 Hypertensive heart disease with heart failure: Secondary | ICD-10-CM | POA: Diagnosis not present

## 2022-09-05 DIAGNOSIS — L89152 Pressure ulcer of sacral region, stage 2: Secondary | ICD-10-CM | POA: Diagnosis not present

## 2022-09-05 DIAGNOSIS — I252 Old myocardial infarction: Secondary | ICD-10-CM | POA: Diagnosis not present

## 2022-09-05 DIAGNOSIS — I5042 Chronic combined systolic (congestive) and diastolic (congestive) heart failure: Secondary | ICD-10-CM | POA: Diagnosis not present

## 2022-09-10 DIAGNOSIS — G8929 Other chronic pain: Secondary | ICD-10-CM | POA: Diagnosis not present

## 2022-09-10 DIAGNOSIS — M48061 Spinal stenosis, lumbar region without neurogenic claudication: Secondary | ICD-10-CM | POA: Diagnosis not present

## 2022-09-10 DIAGNOSIS — N1831 Chronic kidney disease, stage 3a: Secondary | ICD-10-CM | POA: Diagnosis not present

## 2022-09-10 DIAGNOSIS — C3492 Malignant neoplasm of unspecified part of left bronchus or lung: Secondary | ICD-10-CM | POA: Diagnosis not present

## 2022-09-10 DIAGNOSIS — Z23 Encounter for immunization: Secondary | ICD-10-CM | POA: Diagnosis not present

## 2022-09-10 DIAGNOSIS — G629 Polyneuropathy, unspecified: Secondary | ICD-10-CM | POA: Diagnosis not present

## 2022-09-10 DIAGNOSIS — E1140 Type 2 diabetes mellitus with diabetic neuropathy, unspecified: Secondary | ICD-10-CM | POA: Diagnosis not present

## 2022-09-13 DIAGNOSIS — I252 Old myocardial infarction: Secondary | ICD-10-CM | POA: Diagnosis not present

## 2022-09-13 DIAGNOSIS — I5042 Chronic combined systolic (congestive) and diastolic (congestive) heart failure: Secondary | ICD-10-CM | POA: Diagnosis not present

## 2022-09-13 DIAGNOSIS — L89152 Pressure ulcer of sacral region, stage 2: Secondary | ICD-10-CM | POA: Diagnosis not present

## 2022-09-13 DIAGNOSIS — I110 Hypertensive heart disease with heart failure: Secondary | ICD-10-CM | POA: Diagnosis not present

## 2022-09-13 DIAGNOSIS — I25119 Atherosclerotic heart disease of native coronary artery with unspecified angina pectoris: Secondary | ICD-10-CM | POA: Diagnosis not present

## 2022-09-18 ENCOUNTER — Encounter (HOSPITAL_COMMUNITY): Payer: Self-pay

## 2022-09-18 ENCOUNTER — Encounter (HOSPITAL_COMMUNITY): Payer: Medicare Other

## 2022-09-18 ENCOUNTER — Inpatient Hospital Stay: Payer: Medicare Other | Admitting: Internal Medicine

## 2022-09-22 IMAGING — DX DG CHEST 1V PORT
1 series · 1 of 1 positions shown · non-contrast
Comparison: 12/15/2020

CLINICAL DATA: Cough, fever, hypoxia

EXAM:
PORTABLE CHEST 1 VIEW

[chest ap]
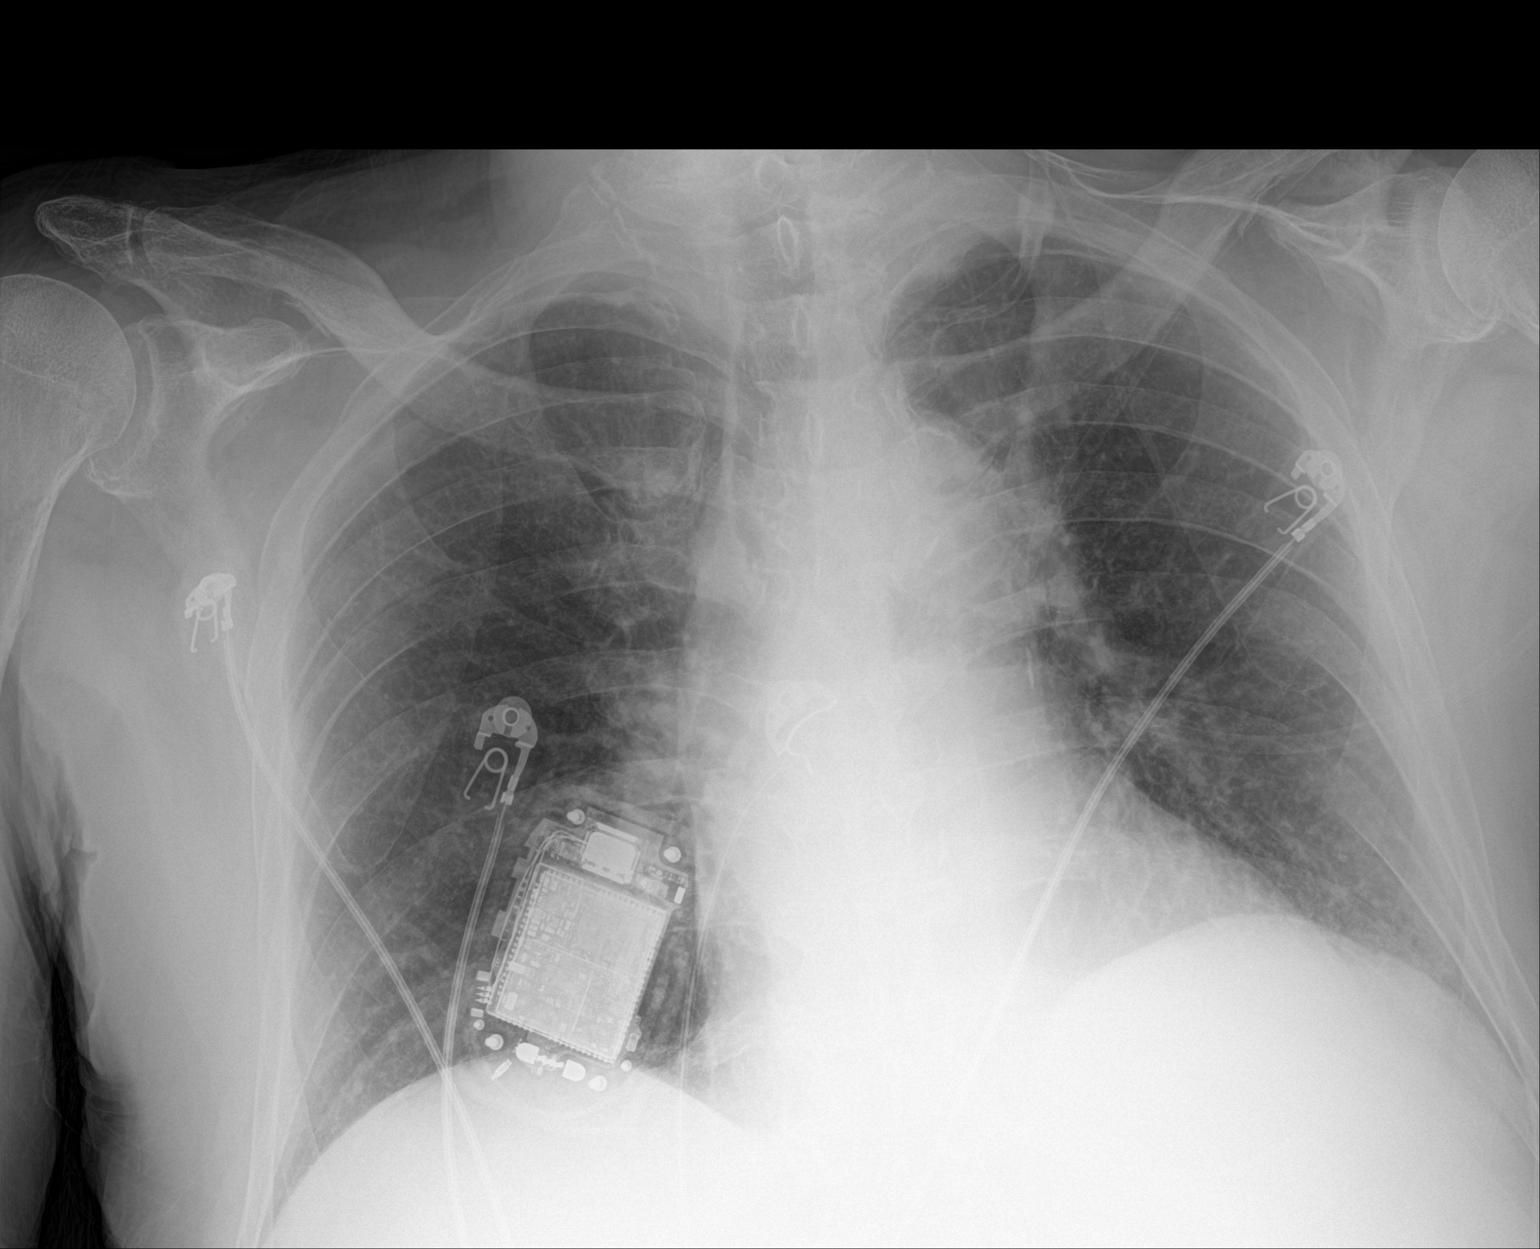

[1 of 1 positions shown; findings below may reference images not displayed]

FINDINGS: Heart and mediastinal contours are within normal limits. No focal
opacities or effusions. No acute bony abnormality.
IMPRESSION: No active disease.

## 2022-09-23 IMAGING — CT CT ANGIO CHEST
3 of 7 series · 18 of 36 positions shown · IV contrast (omnipaque)
Comparison: 08/08/2011

CLINICAL DATA: Chest pain and shortness of breath, possible
[id number] positivity

EXAM:
CT ANGIOGRAPHY CHEST WITH CONTRAST
TECHNIQUE: Multidetector CT imaging of the chest was performed using the
standard protocol during bolus administration of intravenous
contrast. Multiplanar CT image reconstructions and MIPs were
obtained to evaluate the vascular anatomy.
CONTRAST:  100mL OMNIPAQUE IOHEXOL 350 MG/ML SOLN

[Series 5: thins · axial · 0.79mm/px · z∈[+910,+1218]mm · 13 of 361 slices shown]
[im 26/361  lung]
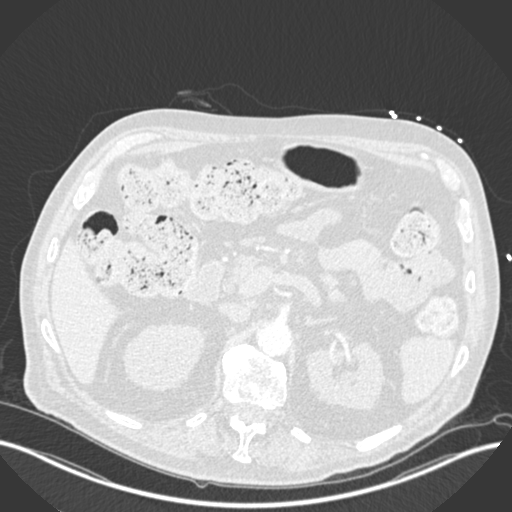
[im 52/361  mediastinal]
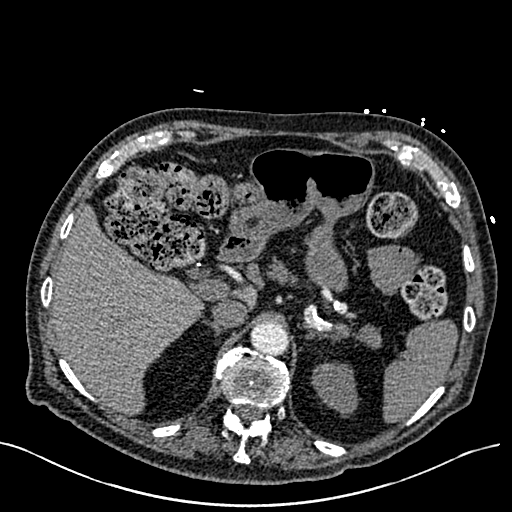
[im 78/361  lung]
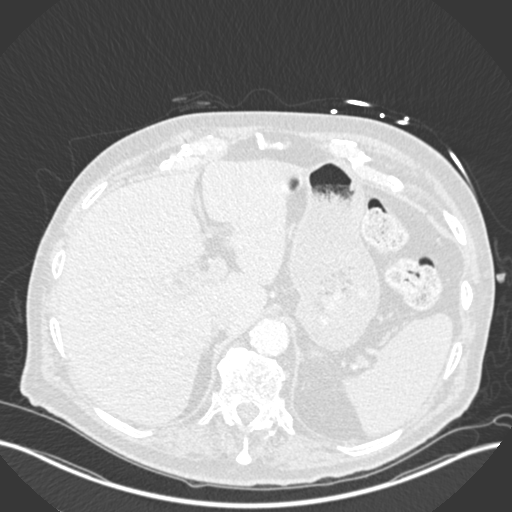
[im 103/361  mediastinal]
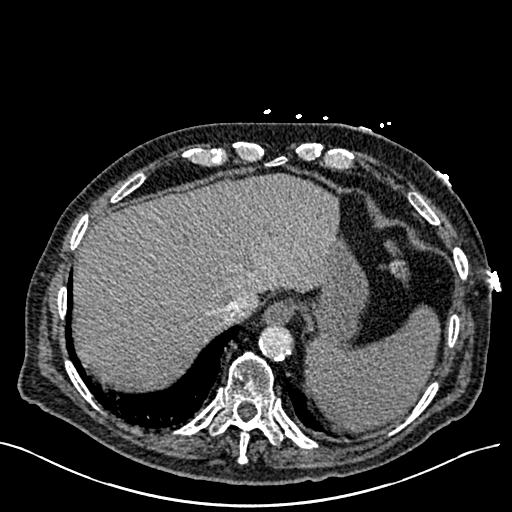
[im 129/361  lung]
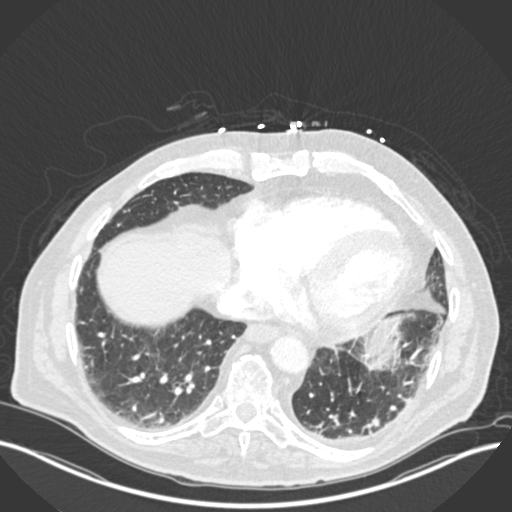
[im 155/361  mediastinal]
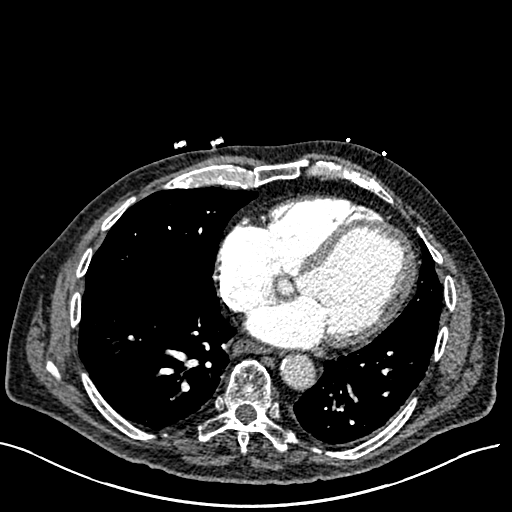
[im 181/361  lung]
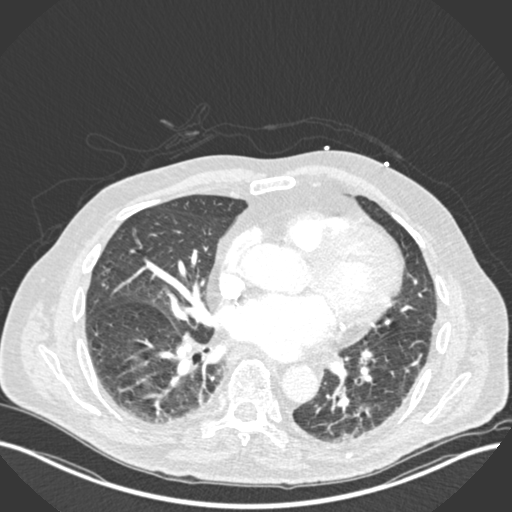
[im 206/361  mediastinal]
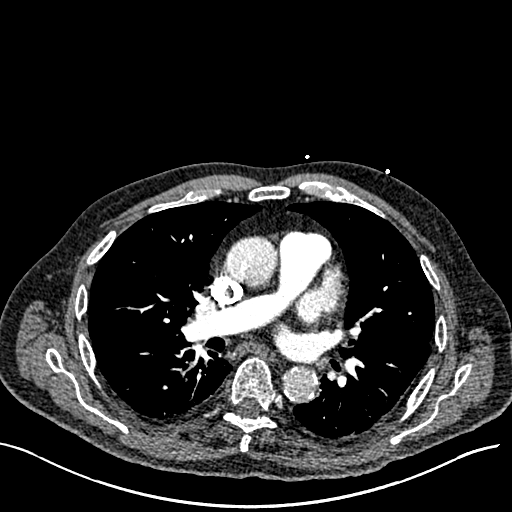
[im 232/361  lung]
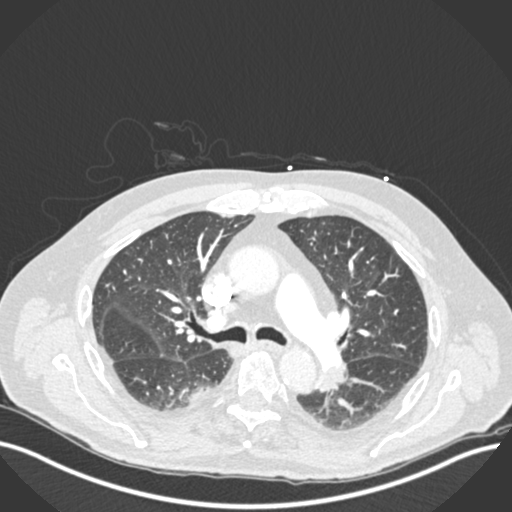
[im 258/361  mediastinal]
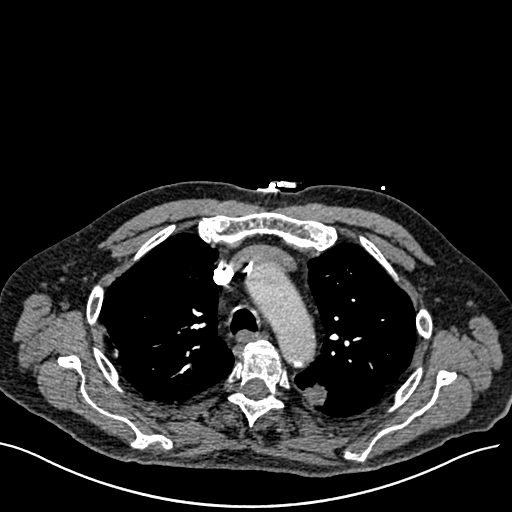
[im 283/361  lung]
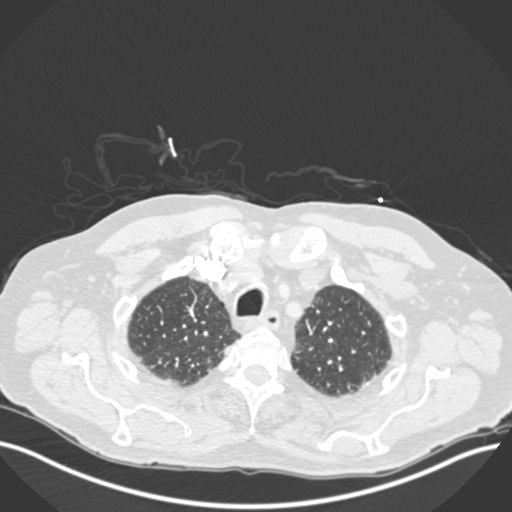
[im 309/361  mediastinal]
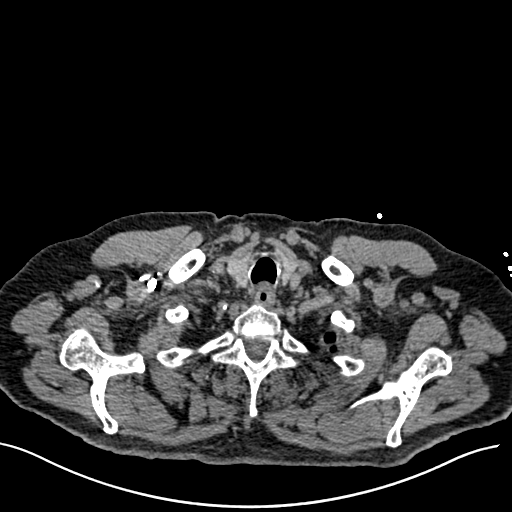
[im 335/361  lung]
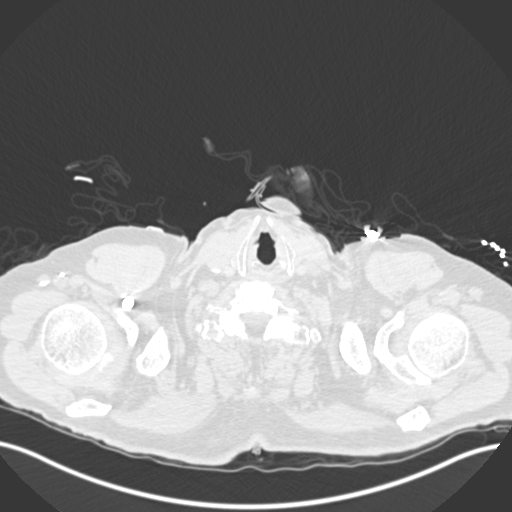

[Series 6: coronal mpr · coronal · 0.71mm/px · 1 of 164 slices shown]
[im 82/164  mediastinal]
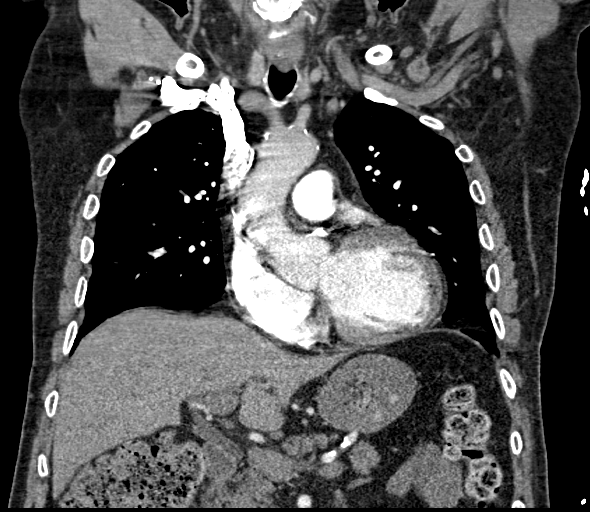

[Series 10: lung · axial · 0.79mm/px · z∈[+1014,+1186]mm · 4 of 144 slices shown]
[im 29/144  mediastinal]
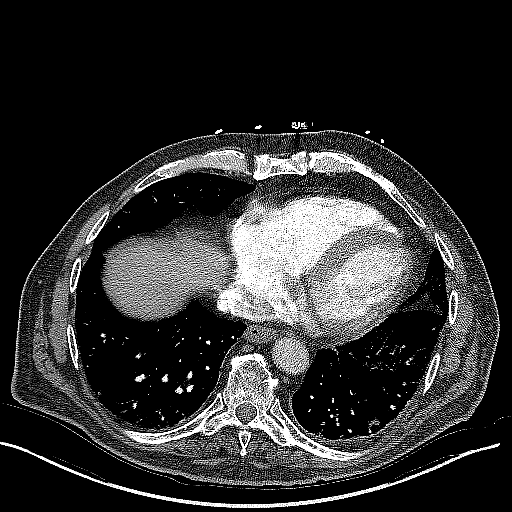
[im 58/144  mediastinal]
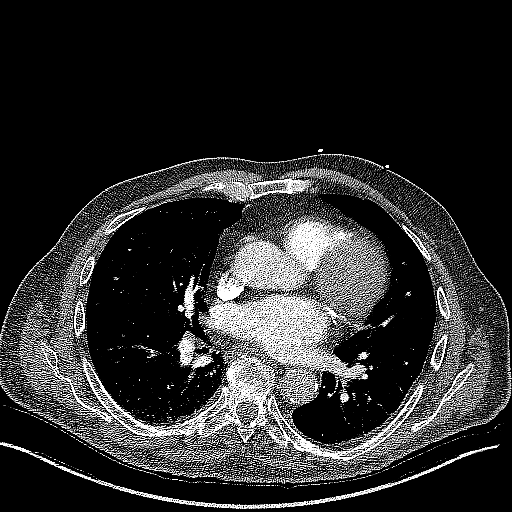
[im 86/144  mediastinal]
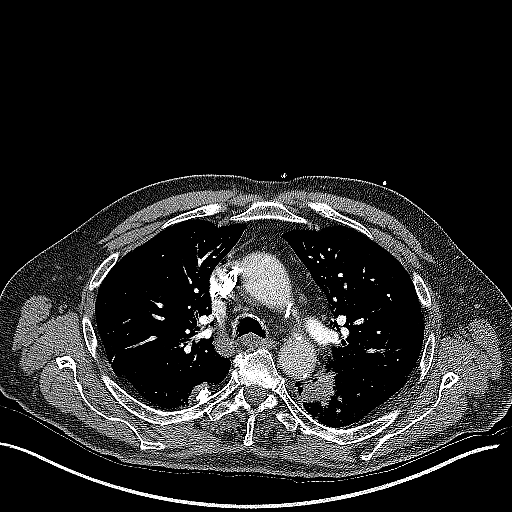
[im 115/144  mediastinal]
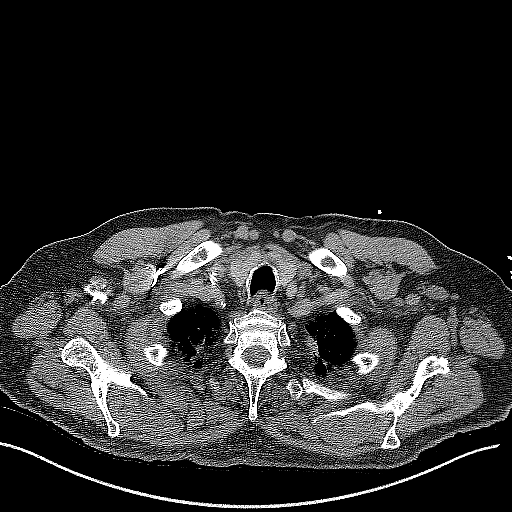

[18 of 36 positions shown; findings below may reference images not displayed]

FINDINGS: Cardiovascular: Thoracic aorta demonstrates atherosclerotic
calcifications without aneurysmal dilatation or dissection. Coronary
calcifications are seen. Heart is at the upper limits of normal in
size. Pulmonary artery shows a normal branching pattern bilaterally.
No focal filling defects are identified to suggest pulmonary emboli.

Mediastinum/Nodes: Thoracic inlet is within normal limits. No
sizable hilar or mediastinal adenopathy is noted. The esophagus as
visualized limits.

Lungs/Pleura: Emphysematous changes are noted. Right lung is well
aerated without focal infiltrate or sizable effusion. The left lung
demonstrates a small nodular density in the lower lobe along the
diaphragm best seen on image number 121 of series 10. This is
smaller in size than that seen on the prior exam and felt to be
benign in etiology given its long-term presence. In the superior
segment of the left lower lobe however there is a somewhat
spiculated appearing soft tissue lesion which measures approximately
2.9 x 1.9 cm. It abuts the pulmonary artery in this region and
causes mild contour deformity. This is best appreciated on image
number 65 series 10. These changes are consistent with primary
pulmonary neoplasm till proven otherwise. Partially calcified
pleural plaque is noted along the superior segment of the right
lower stable in appearance from the prior exam.

Upper Abdomen: Visualized upper abdomen shows no acute abnormality.

Musculoskeletal: Degenerative changes of the thoracic spine are
noted. No rib abnormality is noted.

Review of the MIP images confirms the above findings.
IMPRESSION: Spiculated appearing soft tissue mass lesion in the superior segment
of the left lower lobe as described above. This causes some contour
deformity on the pulmonary artery as described above. This is felt
to represent primary pulmonary neoplasm till proven otherwise.
Bronchoscopic evaluation may be helpful.

No evidence of pulmonary emboli.

Slightly smaller nodule in the left lower lobe when compared with
the prior exam from [DATE] consistent with a benign etiology.

Aortic Atherosclerosis (2EXLC-4WH.H) and Emphysema (2EXLC-TPC.Q).

## 2022-10-02 DIAGNOSIS — D6869 Other thrombophilia: Secondary | ICD-10-CM | POA: Diagnosis not present

## 2022-10-02 DIAGNOSIS — C3492 Malignant neoplasm of unspecified part of left bronchus or lung: Secondary | ICD-10-CM | POA: Diagnosis not present

## 2022-10-02 DIAGNOSIS — N1831 Chronic kidney disease, stage 3a: Secondary | ICD-10-CM | POA: Diagnosis not present

## 2022-10-02 DIAGNOSIS — Z0000 Encounter for general adult medical examination without abnormal findings: Secondary | ICD-10-CM | POA: Diagnosis not present

## 2022-10-02 DIAGNOSIS — I25119 Atherosclerotic heart disease of native coronary artery with unspecified angina pectoris: Secondary | ICD-10-CM | POA: Diagnosis not present

## 2022-10-02 DIAGNOSIS — G629 Polyneuropathy, unspecified: Secondary | ICD-10-CM | POA: Diagnosis not present

## 2022-10-02 DIAGNOSIS — E7800 Pure hypercholesterolemia, unspecified: Secondary | ICD-10-CM | POA: Diagnosis not present

## 2022-10-02 DIAGNOSIS — I10 Essential (primary) hypertension: Secondary | ICD-10-CM | POA: Diagnosis not present

## 2022-10-02 DIAGNOSIS — E039 Hypothyroidism, unspecified: Secondary | ICD-10-CM | POA: Diagnosis not present

## 2022-10-02 DIAGNOSIS — I5042 Chronic combined systolic (congestive) and diastolic (congestive) heart failure: Secondary | ICD-10-CM | POA: Diagnosis not present

## 2022-10-02 DIAGNOSIS — F5101 Primary insomnia: Secondary | ICD-10-CM | POA: Diagnosis not present

## 2022-10-02 DIAGNOSIS — I480 Paroxysmal atrial fibrillation: Secondary | ICD-10-CM | POA: Diagnosis not present

## 2022-10-02 DIAGNOSIS — E1140 Type 2 diabetes mellitus with diabetic neuropathy, unspecified: Secondary | ICD-10-CM | POA: Diagnosis not present

## 2022-10-04 ENCOUNTER — Encounter (HOSPITAL_COMMUNITY)
Admission: RE | Admit: 2022-10-04 | Discharge: 2022-10-04 | Disposition: A | Discharge status: Home / Self Care | Payer: Medicare Other | Source: Ambulatory Visit | Attending: Internal Medicine | Admitting: Internal Medicine

## 2022-10-04 DIAGNOSIS — C3490 Malignant neoplasm of unspecified part of unspecified bronchus or lung: Secondary | ICD-10-CM | POA: Insufficient documentation

## 2022-10-04 LAB — GLUCOSE, CAPILLARY: Glucose-Capillary: 199 mg/dL — ABNORMAL HIGH (ref 70–99)

## 2022-10-04 MED ORDER — FLUDEOXYGLUCOSE F - 18 (FDG) INJECTION
9.6000 | Freq: Once | INTRAVENOUS | Status: AC
  Administered 2022-10-04: 9.5 via INTRAVENOUS

## 2022-10-07 ENCOUNTER — Telehealth: Payer: Self-pay | Admitting: Medical Oncology

## 2022-10-07 NOTE — Telephone Encounter (Signed)
PET scan results requested. I told pt to keep appt on wed to review results. He read scan in mychart and saw " follow up in 1 year so that is good".

## 2022-10-09 ENCOUNTER — Inpatient Hospital Stay: Payer: Medicare Other | Attending: Internal Medicine | Admitting: Internal Medicine

## 2022-10-09 VITALS — BP 120/66 | HR 87 | Temp 98.5°F | Resp 18

## 2022-10-09 DIAGNOSIS — G629 Polyneuropathy, unspecified: Secondary | ICD-10-CM | POA: Insufficient documentation

## 2022-10-09 DIAGNOSIS — E1165 Type 2 diabetes mellitus with hyperglycemia: Secondary | ICD-10-CM | POA: Diagnosis not present

## 2022-10-09 DIAGNOSIS — C3432 Malignant neoplasm of lower lobe, left bronchus or lung: Secondary | ICD-10-CM | POA: Insufficient documentation

## 2022-10-09 DIAGNOSIS — R5383 Other fatigue: Secondary | ICD-10-CM | POA: Diagnosis not present

## 2022-10-09 DIAGNOSIS — Z923 Personal history of irradiation: Secondary | ICD-10-CM | POA: Diagnosis not present

## 2022-10-09 DIAGNOSIS — Z79899 Other long term (current) drug therapy: Secondary | ICD-10-CM | POA: Insufficient documentation

## 2022-10-09 DIAGNOSIS — C3490 Malignant neoplasm of unspecified part of unspecified bronchus or lung: Secondary | ICD-10-CM | POA: Diagnosis not present

## 2022-10-09 NOTE — Progress Notes (Signed)
[place] [phone]   [phone]  OFFICE PROGRESS NOTE  Kathalene Frames, MD [address], [address] [place] [address]  DIAGNOSIS: Recurrent non-small cell lung cancer initially diagnosed as stage IIb (T3, N0, M0) non-small cell lung cancer, squamous cell carcinoma presented with large left lower lobe lung mass in addition to suspicious left hilar lymphadenopathy (but is not completely confirmed)  diagnosed in September 2022.  The patient had recurrent disease in the form of left lower lobe lung nodule that biopsy-proven to be squamous cell carcinoma in March 2023  PRIOR THERAPY:  1) Status post SBRT to the large left lower lobe lung mass under the care of Dr. Lisbeth Renshaw completed August 17, 2021. 2) status post SBRT to another left lower lobe lung nodule under the care of Dr. Lisbeth Renshaw completed on 01/30/2022  CURRENT THERAPY: Observation  INTERVAL HISTORY: BASIM BARTNIK 81 y.o. male returns to the clinic today for follow-up visit accompanied by his wife.  The patient is feeling fine today with no concerning complaints except for the baseline fatigue.  He denied having any current chest pain, shortness of breath except with exertion with no cough or hemoptysis.  He has no nausea, vomiting, diarrhea or constipation.  He has no headache or visual changes.  He has no recent weight loss or night sweats.  He was found on previous imaging studies to have concern for progressive disease.  I ordered a PET scan which was performed recently and he is here for evaluation and discussion of his PET scan results.  MEDICAL HISTORY: Past Medical History:  Diagnosis Date   AAA (abdominal aortic aneurysm) ([place])    a. s/p repair 2009.   Acid reflux    takes Prilosec and Protonix daily   Arthritis    BacK   CAD in native artery    a. a. prior inf MI 1993. b. PCI to LAD 05/1997. c. recurrent inferolateral MI complicated by V. fib arrest in 04/1998, prior  PCI to OM1. d. 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   Cancer (HCC)    basal and squamous cell   Chronic combined systolic and diastolic CHF (congestive heart failure) (HCC)    Complication of anesthesia    had an ileus after a couple of surgeries   COVID    Depression    over 30 years ago   Dry skin    Encephalitis    Foot drop, bilateral    Hypercholesteremia    Hypertension    Ileus (HCC)    After AAA   Incisional hernia    "small, from AAA"   Ischemic cardiomyopathy    MI (myocardial infarction) ([place])    Neuromuscular disorder ([place])    neuropathy in feet   Neuropathy    in feet & legs   OSA on CPAP    uses CPAP--sleep study done at least 51yrs ago   PAF (paroxysmal atrial fibrillation) (HCC)    Pain    back pain chronic- seen at pain clinic   Plantar fasciitis    bilatetral   Pneumonia    Thrombocytopenia (HCC)    Type II diabetes mellitus (HCC)     ALLERGIES:  is allergic to cymbalta [duloxetine hcl], neurontin [gabapentin], atorvastatin, ciprofloxacin, keppra [levetiracetam], pioglitazone, and simvastatin.  MEDICATIONS:  Current Outpatient Medications  Medication Sig Dispense Refill   ACCU-CHEK SMARTVIEW test strip CHECK 1 TO 2 TIMES A DAY     albuterol (  VENTOLIN HFA) 108 (90 Base) MCG/ACT inhaler 1-2 puff as needed     amiodarone (PACERONE) 200 MG tablet TAKE 1 TABLET BY MOUTH AT BEDTIME 90 tablet 1   amitriptyline (ELAVIL) 25 MG tablet Take 25 mg by mouth at bedtime.     atorvastatin (LIPITOR) 10 MG tablet Take 10 mg by mouth in the morning.     Cholecalciferol (VITAMIN D3) 50 MCG (2000 UT) TABS Take 2,000 Units by mouth daily.     Coenzyme Q10 (COQ10) 200 MG CAPS Take 200 mg by mouth in the morning.     Cyanocobalamin (B-12) 5000 MCG CAPS Take 5,000 mcg by mouth daily. 30 capsule 0   famotidine (PEPCID) 20 MG tablet Take 20 mg by mouth daily as needed for heartburn or indigestion.     finasteride (PROPECIA) 1 MG tablet Take 1 mg by mouth daily.     furosemide  (LASIX) 40 MG tablet Take 1 tablet (40 mg total) by mouth daily as needed (fluid retention/edema).     glimepiride (AMARYL) 2 MG tablet Take 3 tablets (6 mg total) by mouth daily with breakfast. 90 tablet 0   JARDIANCE 25 MG TABS tablet Take 25 mg by mouth every evening.     lactose free nutrition (BOOST) LIQD Take 237 mLs by mouth in the morning.     levothyroxine (SYNTHROID) 100 MCG tablet Take 100 mcg by mouth every morning.     levothyroxine (SYNTHROID) 112 MCG tablet Take 112 mcg by mouth every morning.     Lidocaine HCl 4 % CREA Apply 1 application topically 4 (four) times daily as needed (pain.).     metFORMIN (GLUCOPHAGE-XR) 500 MG 24 hr tablet Take 500-1,000 mg by mouth See admin instructions. Take 2 tablets (1000 mg) by mouth in the morning & take 1 tablet (500 mg) by mouth in the evening.     metoprolol tartrate (LOPRESSOR) 25 MG tablet Take 1 tablet (25 mg total) by mouth 2 (two) times daily. 180 tablet 3   mirtazapine (REMERON) 15 MG tablet Take 1 tablet (15 mg total) by mouth at bedtime. 30 tablet 0   Multiple Vitamin (MULTIVITAMIN WITH MINERALS) TABS tablet Take 1 tablet by mouth daily.     ondansetron (ZOFRAN ODT) 4 MG disintegrating tablet Take 1 tablet (4 mg total) by mouth every 8 (eight) hours as needed for nausea or vomiting. (Patient not taking: Reported on 05/08/2022) 20 tablet 0   ondansetron (ZOFRAN) 4 MG tablet Take 4 mg by mouth every 6 (six) hours as needed. (Patient not taking: Reported on 05/08/2022)     oxyCODONE-acetaminophen (PERCOCET) 7.5-325 MG tablet Take 1-2 tablets by mouth at bedtime as needed (pain.).     OZEMPIC, 0.25 OR 0.5 MG/DOSE, 2 MG/1.5ML SOPN Inject 0.5 mg into the skin every Saturday.     pregabalin (LYRICA) 300 MG capsule Take 300 mg by mouth 2 (two) times daily.     RELION PEN NEEDLES 32G X 4 MM MISC      rivaroxaban (XARELTO) 20 MG TABS tablet Take 1 tablet (20 mg total) by mouth every evening. OK to restart this medication on 01/01/2022 30 tablet 0    senna-docusate (SENOKOT-S) 8.6-50 MG tablet Take 1 tablet by mouth daily as needed (constipation.).     traZODone (DESYREL) 50 MG tablet Take 50 mg by mouth at bedtime.     No current facility-administered medications for this visit.    SURGICAL HISTORY:  Past Surgical History:  Procedure Laterality Date   ABDOMINAL  AORTIC ANEURYSM REPAIR     BACK SURGERY  2012-2013 X 3   Miminal Invasive x 3in [place].   BRONCHIAL BIOPSY  07/09/2021   Procedure: BRONCHIAL BIOPSIES;  Surgeon: Collene Gobble, MD;  Location: [facility] ENDOSCOPY;  Service: Pulmonary;;   BRONCHIAL BIOPSY  12/31/2021   Procedure: BRONCHIAL BIOPSIES;  Surgeon: Collene Gobble, MD;  Location: [facility] ENDOSCOPY;  Service: Pulmonary;;   BRONCHIAL BRUSHINGS  07/09/2021   Procedure: BRONCHIAL BRUSHINGS;  Surgeon: Collene Gobble, MD;  Location: [facility] ENDOSCOPY;  Service: Pulmonary;;   BRONCHIAL BRUSHINGS  12/31/2021   Procedure: BRONCHIAL BRUSHINGS;  Surgeon: Collene Gobble, MD;  Location: [facility] ENDOSCOPY;  Service: Pulmonary;;   BRONCHIAL NEEDLE ASPIRATION BIOPSY  07/09/2021   Procedure: BRONCHIAL NEEDLE ASPIRATION BIOPSIES;  Surgeon: Collene Gobble, MD;  Location: [facility] ENDOSCOPY;  Service: Pulmonary;;   BRONCHIAL NEEDLE ASPIRATION BIOPSY  12/31/2021   Procedure: BRONCHIAL NEEDLE ASPIRATION BIOPSIES;  Surgeon: Collene Gobble, MD;  Location: [facility] ENDOSCOPY;  Service: Pulmonary;;   BRONCHIAL WASHINGS  12/31/2021   Procedure: BRONCHIAL WASHINGS;  Surgeon: Collene Gobble, MD;  Location: [facility];  Service: Pulmonary;;   CARDIAC CATHETERIZATION  2009/2012   CARDIOVERSION N/A 04/12/2015   Procedure: CARDIOVERSION;  Surgeon: Sueanne Margarita, MD;  Location: [place];  Service: Cardiovascular;  Laterality: N/A;   CARDIOVERSION N/A 12/26/2016   Procedure: CARDIOVERSION;  Surgeon: Sueanne Margarita, MD;  Location: [place];  Service: Cardiovascular;  Laterality: N/A;   COLONOSCOPY     CORONARY ANGIOPLASTY WITH [place]; 1997   FIDUCIAL MARKER  PLACEMENT  07/09/2021   Procedure: FIDUCIAL MARKER PLACEMENT;  Surgeon: Collene Gobble, MD;  Location: [place];  Service: Pulmonary;;   FIDUCIAL MARKER PLACEMENT  12/31/2021   Procedure: FIDUCIAL MARKER PLACEMENT;  Surgeon: Collene Gobble, MD;  Location: [facility] ENDOSCOPY;  Service: Pulmonary;;   LAPAROSCOPIC CHOLECYSTECTOMY     LUMBAR LAMINECTOMY/DECOMPRESSION MICRODISCECTOMY Right 01/16/2016   Procedure: Right Lumbar five-Sacral one Laminectomy;  Surgeon: Kristeen Miss, MD;  Location: [facility] NEURO ORS;  Service: Neurosurgery;  Laterality: Right;  Right L5-S1 Laminectomy   VIDEO BRONCHOSCOPY WITH ENDOBRONCHIAL NAVIGATION N/A 07/09/2021   Procedure: ROBOTIC VIDEO BRONCHOSCOPY WITH ENDOBRONCHIAL NAVIGATION;  Surgeon: Collene Gobble, MD;  Location: [facility] ENDOSCOPY;  Service: Pulmonary;  Laterality: N/A;   VIDEO BRONCHOSCOPY WITH RADIAL ENDOBRONCHIAL ULTRASOUND  07/09/2021   Procedure: VIDEO BRONCHOSCOPY WITH RADIAL ENDOBRONCHIAL ULTRASOUND;  Surgeon: Collene Gobble, MD;  Location: [facility] ENDOSCOPY;  Service: Pulmonary;;   VIDEO BRONCHOSCOPY WITH RADIAL ENDOBRONCHIAL ULTRASOUND  12/31/2021   Procedure: VIDEO BRONCHOSCOPY WITH RADIAL ENDOBRONCHIAL ULTRASOUND;  Surgeon: Collene Gobble, MD;  Location: [facility] ENDOSCOPY;  Service: Pulmonary;;    REVIEW OF SYSTEMS:  Constitutional: positive for fatigue Eyes: negative Ears, nose, mouth, throat, and face: negative Respiratory: positive for dyspnea on exertion Cardiovascular: negative Gastrointestinal: negative Genitourinary:negative Integument/breast: negative Hematologic/lymphatic: negative Musculoskeletal:negative Neurological: negative Behavioral/Psych: negative Endocrine: negative Allergic/Immunologic: negative   PHYSICAL EXAMINATION: General appearance: alert, cooperative, fatigued, and no distress Head: Normocephalic, without obvious abnormality, atraumatic Neck: no adenopathy, no JVD, supple, symmetrical, trachea midline, and thyroid not enlarged,  symmetric, no tenderness/mass/nodules Lymph nodes: Cervical, supraclavicular, and axillary nodes normal. Resp: clear to auscultation bilaterally Back: symmetric, no curvature. ROM normal. No CVA tenderness. Cardio: regular rate and rhythm, S1, S2 normal, no murmur, click, rub or gallop GI: soft, non-tender; bowel sounds normal; no masses,  no organomegaly Extremities: extremities normal, atraumatic, no cyanosis or edema Neurologic: Alert and oriented X 3, normal  strength and tone. Normal symmetric reflexes. Normal coordination and gait  ECOG PERFORMANCE STATUS: 1 - Symptomatic but completely ambulatory  Blood pressure 120/66, pulse 87, temperature 98.5 F (36.9 C), temperature source Oral, resp. rate 18, SpO2 96 %.  LABORATORY DATA: Lab Results  Component Value Date   WBC 4.2 05/01/2022   HGB 12.7 (L) 05/01/2022   HCT 37.3 (L) 05/01/2022   MCV 93.7 05/01/2022   PLT 162 05/01/2022      Chemistry      Component Value Date/Time   NA 139 05/01/2022 1512   NA 132 (L) 12/31/2019 1625   K 3.9 05/01/2022 1512   CL 100 05/01/2022 1512   CO2 31 05/01/2022 1512   BUN 45 (H) 05/01/2022 1512   BUN 26 12/31/2019 1625   CREATININE 1.54 (H) 05/01/2022 1512   CREATININE 1.03 01/09/2016 1129      Component Value Date/Time   CALCIUM 9.6 05/01/2022 1512   ALKPHOS 81 05/01/2022 1512   AST 17 05/01/2022 1512   ALT 18 05/01/2022 1512   BILITOT 0.6 05/01/2022 1512       RADIOGRAPHIC STUDIES: NM PET Image Restage (PS) Skull Base to Thigh (F-18 FDG)  Result Date: 10/06/2022 CLINICAL DATA:  Subsequent treatment strategy for non-small cell lung cancer. EXAM: NUCLEAR MEDICINE PET SKULL BASE TO THIGH TECHNIQUE: 9.56 mCi F-18 FDG was injected intravenously. Full-ring PET imaging was performed from the skull base to thigh after the radiotracer. CT data was obtained and used for attenuation correction and anatomic localization. Fasting blood glucose: 199 mg/dl COMPARISON:  CT chest 05/02/2019,  PET-CT 12/11/2011 FINDINGS: Mediastinal blood pool activity: SUV max 3.45 Liver activity: SUV max NA NECK: No hypermetabolic lymph nodes in the neck. Incidental CT findings: None. CHEST: No tracer avid axillary, supraclavicular, mediastinal or hilar lymph nodes. -Masslike architectural distortion and fibrosis is again seen within the perihilar left upper lobe and superior segment of right lower lobe this surrounds a central biopsy clip in appears unchanged in size and appearance from 05/01/2022. There is mild increased uptake within this area with SUV max of 2.91 image 23/7. -Masslike architectural distortion, ground-glass attenuation and fibrosis is identified within the in the dependent left lower lobe in the vicinity of the previous spiculated lung nodule in the left lung base. The treated lung lesion is no longer distinguishable from these changes. SUV max within this area is equal to 3.35, image 40/7. -aside from the 2 areas mentioned above there are no new tracer avid pulmonary nodules or mass within either lung. Calcified pleural base nodule overlying the posteromedial right upper lobe is unchanged. Incidental CT findings: Aortic atherosclerosis and coronary artery calcifications. ABDOMEN/PELVIS: No abnormal tracer uptake identified within the liver, pancreas, spleen, or adrenal glands. No tracer avid abdominopelvic lymph nodes. Incidental CT findings: Cholecystectomy. Aortic atherosclerosis. Postoperative changes from aorto bi-iliac grafting of the infrarenal abdominal aorta. Infrarenal abdominal aorta measures 4 cm in diameter. This is compared with 4.1 cm on 12/10/2021. Nonobstructing stone noted within inferior pole of the left kidney measuring 6 mm. SKELETON: No focal hypermetabolic activity to suggest skeletal metastasis. Incidental CT findings: None. IMPRESSION: 1. Two areas of post treatment changes are noted within the perihilar left lung and dependent portion of the left lower lobe. Mild low level  FDG uptake is identified within both of these areas likely reflecting inflammation secondary to external beam radiation. The underlying treated lung lesions are no longer distinguishable from these areas. 2. No signs of tracer avid nodal metastasis or distant  metastatic disease. 3. Status post aorto bi-iliac grafting. Infrarenal abdominal aorta measures 4 cm in diameter. This is compared with 4.1 cm on 12/10/2021. Recommend follow-up every 12 months and vascular consultation. This recommendation follows ACR consensus guidelines: White Paper of the ACR Incidental Findings Committee II on Vascular Findings. J Am Coll Radiol 2013; 10:789-794. 4. Coronary artery calcifications. 5. Nonobstructing left renal calculus. 6.  Aortic Atherosclerosis (ICD10-I70.0). Electronically Signed   By: Kerby Moors M.D.   On: 10/06/2022 17:03    ASSESSMENT AND PLAN: This is a very pleasant 81 years old white male diagnosed with recurrent non-small cell lung cancer, squamous cell carcinoma presented with left lower lobe lung nodule in March 2023.  He was initially diagnosed with stage IIb (T3, N0, M0) non-small cell lung cancer, squamous cell carcinoma in September 2022 1 presented with large left lower lobe lung mass in addition to suspicious left hilar lymphadenopathy. The patient is status post SBRT to the large left lower lobe lung mass in 10 fractions. The patient has been in observation since the initial treatment of radiotherapy. Unfortunately the scan showed new pulmonary nodule in the left lower lobe with spiculated morphology suspicious for metastatic disease.  There was postradiation changes in the previously radiated left lower lobe lung mass area. The PET scan showed low level hypermetabolism in the left lung posttreatment fibrosis and the new left lower lobe pulmonary nodule also showed low FDG accumulation but still concerning for neoplasm. He had video bronchoscopy with biopsy of the new left lower lobe pulmonary  nodule and the final pathology was consistent with squamous cell carcinoma of the lung. The patient has a local disease recurrence.   The patient underwent SBRT to the new left lower lobe lung nodule under the care of Dr. Lisbeth Renshaw completed on January 30, 2022.   The patient is currently on observation but was found on x-ray of the left femur to have a suspicious metastatic lesion. I ordered a PET scan which was performed recently.  I personally and independently reviewed the PET scan images and discussed the result and showed the images to the patient today. His PET scan showed no clear evidence for disease recurrence or metastasis.  Specifically the skeleton imaging showed no evidence of metastatic disease to the bone. I recommended for the patient to continue on observation with repeat CT scan of the chest in 6 months but if he develop any pain on the left femur area, he may need further imaging studies with either CT or MRI of the left femur. He was advised to call immediately if he has any other concerning symptoms in the interval.  The patient voices understanding of current disease status and treatment options and is in agreement with the current care plan.  All questions were answered. The patient knows to call the clinic with any problems, questions or concerns. We can certainly see the patient much sooner if necessary.  The total time spent in the appointment was 30 minutes.  Disclaimer: This note was dictated with voice recognition software. Similar sounding words can inadvertently be transcribed and may not be corrected upon review.

## 2022-10-10 DIAGNOSIS — E1140 Type 2 diabetes mellitus with diabetic neuropathy, unspecified: Secondary | ICD-10-CM | POA: Diagnosis not present

## 2022-10-10 DIAGNOSIS — E7800 Pure hypercholesterolemia, unspecified: Secondary | ICD-10-CM | POA: Diagnosis not present

## 2022-10-10 DIAGNOSIS — I5042 Chronic combined systolic (congestive) and diastolic (congestive) heart failure: Secondary | ICD-10-CM | POA: Diagnosis not present

## 2022-10-10 DIAGNOSIS — K219 Gastro-esophageal reflux disease without esophagitis: Secondary | ICD-10-CM | POA: Diagnosis not present

## 2022-10-10 DIAGNOSIS — I10 Essential (primary) hypertension: Secondary | ICD-10-CM | POA: Diagnosis not present

## 2022-10-10 DIAGNOSIS — E039 Hypothyroidism, unspecified: Secondary | ICD-10-CM | POA: Diagnosis not present

## 2022-10-14 ENCOUNTER — Telehealth: Payer: Self-pay

## 2022-10-14 NOTE — Telephone Encounter (Signed)
(  2:04 pm) PC SW attempted to contact patient and his wife to no avail. SW left a message requesting a call back to schedule initial palliative care visit.

## 2022-10-17 ENCOUNTER — Other Ambulatory Visit: Payer: Self-pay

## 2022-10-18 ENCOUNTER — Other Ambulatory Visit: Payer: Self-pay

## 2022-10-18 ENCOUNTER — Emergency Department (HOSPITAL_COMMUNITY): Payer: Medicare Other

## 2022-10-18 ENCOUNTER — Inpatient Hospital Stay (HOSPITAL_COMMUNITY)
Admission: EM | Admit: 2022-10-18 | Discharge: 2022-10-28 | DRG: 070 | Disposition: E | Payer: Medicare Other | Attending: Internal Medicine | Admitting: Internal Medicine

## 2022-10-18 DIAGNOSIS — K5900 Constipation, unspecified: Secondary | ICD-10-CM | POA: Diagnosis present

## 2022-10-18 DIAGNOSIS — I5042 Chronic combined systolic (congestive) and diastolic (congestive) heart failure: Secondary | ICD-10-CM | POA: Diagnosis not present

## 2022-10-18 DIAGNOSIS — C3432 Malignant neoplasm of lower lobe, left bronchus or lung: Secondary | ICD-10-CM | POA: Diagnosis not present

## 2022-10-18 DIAGNOSIS — Z8674 Personal history of sudden cardiac arrest: Secondary | ICD-10-CM

## 2022-10-18 DIAGNOSIS — Z515 Encounter for palliative care: Secondary | ICD-10-CM

## 2022-10-18 DIAGNOSIS — Z881 Allergy status to other antibiotic agents status: Secondary | ICD-10-CM

## 2022-10-18 DIAGNOSIS — E039 Hypothyroidism, unspecified: Secondary | ICD-10-CM | POA: Diagnosis not present

## 2022-10-18 DIAGNOSIS — C3490 Malignant neoplasm of unspecified part of unspecified bronchus or lung: Secondary | ICD-10-CM | POA: Diagnosis not present

## 2022-10-18 DIAGNOSIS — Z9181 History of falling: Secondary | ICD-10-CM

## 2022-10-18 DIAGNOSIS — R339 Retention of urine, unspecified: Secondary | ICD-10-CM | POA: Diagnosis present

## 2022-10-18 DIAGNOSIS — Z888 Allergy status to other drugs, medicaments and biological substances status: Secondary | ICD-10-CM

## 2022-10-18 DIAGNOSIS — Z87891 Personal history of nicotine dependence: Secondary | ICD-10-CM

## 2022-10-18 DIAGNOSIS — Y92009 Unspecified place in unspecified non-institutional (private) residence as the place of occurrence of the external cause: Secondary | ICD-10-CM | POA: Diagnosis not present

## 2022-10-18 DIAGNOSIS — M47813 Spondylosis without myelopathy or radiculopathy, cervicothoracic region: Secondary | ICD-10-CM | POA: Diagnosis not present

## 2022-10-18 DIAGNOSIS — J9690 Respiratory failure, unspecified, unspecified whether with hypoxia or hypercapnia: Secondary | ICD-10-CM | POA: Diagnosis not present

## 2022-10-18 DIAGNOSIS — S199XXA Unspecified injury of neck, initial encounter: Secondary | ICD-10-CM | POA: Diagnosis not present

## 2022-10-18 DIAGNOSIS — Z66 Do not resuscitate: Secondary | ICD-10-CM | POA: Diagnosis present

## 2022-10-18 DIAGNOSIS — Z781 Physical restraint status: Secondary | ICD-10-CM

## 2022-10-18 DIAGNOSIS — E1140 Type 2 diabetes mellitus with diabetic neuropathy, unspecified: Secondary | ICD-10-CM | POA: Diagnosis present

## 2022-10-18 DIAGNOSIS — R9431 Abnormal electrocardiogram [ECG] [EKG]: Secondary | ICD-10-CM | POA: Insufficient documentation

## 2022-10-18 DIAGNOSIS — R4182 Altered mental status, unspecified: Secondary | ICD-10-CM | POA: Diagnosis present

## 2022-10-18 DIAGNOSIS — E874 Mixed disorder of acid-base balance: Secondary | ICD-10-CM | POA: Diagnosis present

## 2022-10-18 DIAGNOSIS — I480 Paroxysmal atrial fibrillation: Secondary | ICD-10-CM | POA: Diagnosis present

## 2022-10-18 DIAGNOSIS — Z79899 Other long term (current) drug therapy: Secondary | ICD-10-CM | POA: Diagnosis not present

## 2022-10-18 DIAGNOSIS — E8729 Other acidosis: Secondary | ICD-10-CM | POA: Diagnosis not present

## 2022-10-18 DIAGNOSIS — E7800 Pure hypercholesterolemia, unspecified: Secondary | ICD-10-CM | POA: Diagnosis not present

## 2022-10-18 DIAGNOSIS — M4692 Unspecified inflammatory spondylopathy, cervical region: Secondary | ICD-10-CM | POA: Diagnosis not present

## 2022-10-18 DIAGNOSIS — Z7901 Long term (current) use of anticoagulants: Secondary | ICD-10-CM

## 2022-10-18 DIAGNOSIS — Z7984 Long term (current) use of oral hypoglycemic drugs: Secondary | ICD-10-CM

## 2022-10-18 DIAGNOSIS — K219 Gastro-esophageal reflux disease without esophagitis: Secondary | ICD-10-CM | POA: Diagnosis present

## 2022-10-18 DIAGNOSIS — Z833 Family history of diabetes mellitus: Secondary | ICD-10-CM

## 2022-10-18 DIAGNOSIS — E1165 Type 2 diabetes mellitus with hyperglycemia: Secondary | ICD-10-CM | POA: Diagnosis present

## 2022-10-18 DIAGNOSIS — G4730 Sleep apnea, unspecified: Secondary | ICD-10-CM | POA: Diagnosis present

## 2022-10-18 DIAGNOSIS — E785 Hyperlipidemia, unspecified: Secondary | ICD-10-CM | POA: Diagnosis present

## 2022-10-18 DIAGNOSIS — N179 Acute kidney failure, unspecified: Secondary | ICD-10-CM | POA: Diagnosis not present

## 2022-10-18 DIAGNOSIS — E870 Hyperosmolality and hypernatremia: Secondary | ICD-10-CM | POA: Diagnosis not present

## 2022-10-18 DIAGNOSIS — R338 Other retention of urine: Secondary | ICD-10-CM | POA: Diagnosis present

## 2022-10-18 DIAGNOSIS — F0781 Postconcussional syndrome: Secondary | ICD-10-CM | POA: Diagnosis present

## 2022-10-18 DIAGNOSIS — Z1152 Encounter for screening for COVID-19: Secondary | ICD-10-CM | POA: Diagnosis not present

## 2022-10-18 DIAGNOSIS — R296 Repeated falls: Secondary | ICD-10-CM

## 2022-10-18 DIAGNOSIS — R5381 Other malaise: Secondary | ICD-10-CM | POA: Diagnosis present

## 2022-10-18 DIAGNOSIS — R0902 Hypoxemia: Secondary | ICD-10-CM | POA: Diagnosis not present

## 2022-10-18 DIAGNOSIS — Z8616 Personal history of COVID-19: Secondary | ICD-10-CM

## 2022-10-18 DIAGNOSIS — D6859 Other primary thrombophilia: Secondary | ICD-10-CM | POA: Diagnosis present

## 2022-10-18 DIAGNOSIS — R531 Weakness: Secondary | ICD-10-CM | POA: Diagnosis not present

## 2022-10-18 DIAGNOSIS — M47812 Spondylosis without myelopathy or radiculopathy, cervical region: Secondary | ICD-10-CM | POA: Diagnosis not present

## 2022-10-18 DIAGNOSIS — G4733 Obstructive sleep apnea (adult) (pediatric): Secondary | ICD-10-CM | POA: Diagnosis present

## 2022-10-18 DIAGNOSIS — Z79890 Hormone replacement therapy (postmenopausal): Secondary | ICD-10-CM

## 2022-10-18 DIAGNOSIS — D6869 Other thrombophilia: Secondary | ICD-10-CM | POA: Diagnosis present

## 2022-10-18 DIAGNOSIS — R4701 Aphasia: Secondary | ICD-10-CM | POA: Diagnosis present

## 2022-10-18 DIAGNOSIS — I110 Hypertensive heart disease with heart failure: Secondary | ICD-10-CM | POA: Diagnosis not present

## 2022-10-18 DIAGNOSIS — D72829 Elevated white blood cell count, unspecified: Secondary | ICD-10-CM | POA: Diagnosis present

## 2022-10-18 DIAGNOSIS — M5416 Radiculopathy, lumbar region: Secondary | ICD-10-CM | POA: Diagnosis present

## 2022-10-18 DIAGNOSIS — W19XXXA Unspecified fall, initial encounter: Secondary | ICD-10-CM | POA: Diagnosis not present

## 2022-10-18 DIAGNOSIS — Z8249 Family history of ischemic heart disease and other diseases of the circulatory system: Secondary | ICD-10-CM

## 2022-10-18 DIAGNOSIS — E1142 Type 2 diabetes mellitus with diabetic polyneuropathy: Secondary | ICD-10-CM | POA: Diagnosis not present

## 2022-10-18 DIAGNOSIS — M21372 Foot drop, left foot: Secondary | ICD-10-CM | POA: Diagnosis present

## 2022-10-18 DIAGNOSIS — Z8261 Family history of arthritis: Secondary | ICD-10-CM

## 2022-10-18 DIAGNOSIS — G8929 Other chronic pain: Secondary | ICD-10-CM | POA: Diagnosis present

## 2022-10-18 DIAGNOSIS — Z955 Presence of coronary angioplasty implant and graft: Secondary | ICD-10-CM

## 2022-10-18 DIAGNOSIS — F0390 Unspecified dementia without behavioral disturbance: Secondary | ICD-10-CM | POA: Diagnosis present

## 2022-10-18 DIAGNOSIS — I25119 Atherosclerotic heart disease of native coronary artery with unspecified angina pectoris: Secondary | ICD-10-CM | POA: Diagnosis present

## 2022-10-18 DIAGNOSIS — A419 Sepsis, unspecified organism: Secondary | ICD-10-CM | POA: Diagnosis not present

## 2022-10-18 DIAGNOSIS — J90 Pleural effusion, not elsewhere classified: Secondary | ICD-10-CM | POA: Diagnosis not present

## 2022-10-18 DIAGNOSIS — M21371 Foot drop, right foot: Secondary | ICD-10-CM | POA: Diagnosis present

## 2022-10-18 DIAGNOSIS — Z85828 Personal history of other malignant neoplasm of skin: Secondary | ICD-10-CM

## 2022-10-18 DIAGNOSIS — S0990XA Unspecified injury of head, initial encounter: Secondary | ICD-10-CM | POA: Diagnosis not present

## 2022-10-18 DIAGNOSIS — G9341 Metabolic encephalopathy: Secondary | ICD-10-CM | POA: Diagnosis not present

## 2022-10-18 DIAGNOSIS — I10 Essential (primary) hypertension: Secondary | ICD-10-CM | POA: Diagnosis present

## 2022-10-18 DIAGNOSIS — R6520 Severe sepsis without septic shock: Secondary | ICD-10-CM | POA: Diagnosis not present

## 2022-10-18 DIAGNOSIS — I255 Ischemic cardiomyopathy: Secondary | ICD-10-CM | POA: Diagnosis present

## 2022-10-18 DIAGNOSIS — J690 Pneumonitis due to inhalation of food and vomit: Secondary | ICD-10-CM | POA: Diagnosis present

## 2022-10-18 DIAGNOSIS — I252 Old myocardial infarction: Secondary | ICD-10-CM

## 2022-10-18 DIAGNOSIS — R197 Diarrhea, unspecified: Secondary | ICD-10-CM | POA: Diagnosis not present

## 2022-10-18 DIAGNOSIS — J9601 Acute respiratory failure with hypoxia: Secondary | ICD-10-CM | POA: Diagnosis not present

## 2022-10-18 DIAGNOSIS — G319 Degenerative disease of nervous system, unspecified: Secondary | ICD-10-CM | POA: Diagnosis not present

## 2022-10-18 DIAGNOSIS — F1120 Opioid dependence, uncomplicated: Secondary | ICD-10-CM | POA: Diagnosis present

## 2022-10-18 DIAGNOSIS — I6782 Cerebral ischemia: Secondary | ICD-10-CM | POA: Diagnosis not present

## 2022-10-18 DIAGNOSIS — Z743 Need for continuous supervision: Secondary | ICD-10-CM | POA: Diagnosis not present

## 2022-10-18 LAB — URINALYSIS, ROUTINE W REFLEX MICROSCOPIC
Bacteria, UA: NONE SEEN
Bilirubin Urine: NEGATIVE
Glucose, UA: >=500 mg/dL — AB
Hgb urine dipstick: NEGATIVE
Ketones, ur: 5 mg/dL — AB
Leukocytes,Ua: NEGATIVE
Nitrite: NEGATIVE
Protein, ur: NEGATIVE mg/dL
Specific Gravity, Urine: 1.016 (ref 1.005–1.030)
pH: 7 (ref 5.0–8.0)

## 2022-10-18 LAB — CBC
HCT: 38 % — ABNORMAL LOW (ref 39.0–52.0)
Hemoglobin: 12.7 g/dL — ABNORMAL LOW (ref 13.0–17.0)
MCH: 32.4 pg (ref 26.0–34.0)
MCHC: 33.4 g/dL (ref 30.0–36.0)
MCV: 96.9 fL (ref 80.0–100.0)
Platelets: 166 10*3/uL (ref 150–400)
RBC: 3.92 MIL/uL — ABNORMAL LOW (ref 4.22–5.81)
RDW: 15.3 % (ref 11.5–15.5)
WBC: 7 10*3/uL (ref 4.0–10.5)
nRBC: 0 % (ref 0.0–0.2)

## 2022-10-18 LAB — PROTIME-INR
INR: 1.9 — ABNORMAL HIGH (ref 0.8–1.2)
Prothrombin Time: 21.9 seconds — ABNORMAL HIGH (ref 11.4–15.2)

## 2022-10-18 LAB — CK: Total CK: 49 U/L (ref 49–397)

## 2022-10-18 LAB — BASIC METABOLIC PANEL
Anion gap: 10 (ref 5–15)
BUN: 26 mg/dL — ABNORMAL HIGH (ref 8–23)
CO2: 27 mmol/L (ref 22–32)
Calcium: 9.3 mg/dL (ref 8.9–10.3)
Chloride: 100 mmol/L (ref 98–111)
Creatinine, Ser: 0.99 mg/dL (ref 0.61–1.24)
GFR, Estimated: >60 mL/min (ref >60)
Glucose, Bld: 228 mg/dL — ABNORMAL HIGH (ref 70–99)
Potassium: 3.9 mmol/L (ref 3.5–5.1)
Sodium: 137 mmol/L (ref 135–145)

## 2022-10-18 LAB — LACTIC ACID, PLASMA: Lactic Acid, Venous: 1.7 mmol/L (ref 0.5–1.9)

## 2022-10-18 LAB — CBG MONITORING, ED: Glucose-Capillary: 204 mg/dL — ABNORMAL HIGH (ref 70–99)

## 2022-10-18 MED ORDER — DIAZEPAM 5 MG/ML IJ SOLN: 5.0000 mg | Freq: Once | INTRAMUSCULAR | Status: DC

## 2022-10-18 MED ORDER — LORAZEPAM 2 MG/ML IJ SOLN
1.0000 mg | Freq: Once | INTRAMUSCULAR | Status: AC | PRN
  Administered 2022-10-18: 1 mg via INTRAVENOUS
  Filled 2022-10-18: qty 1

## 2022-10-18 MED ORDER — SODIUM CHLORIDE 0.9 % IV BOLUS
1000.0000 mL | Freq: Once | INTRAVENOUS | Status: AC
  Administered 2022-10-18: 1000 mL via INTRAVENOUS

## 2022-10-18 MED ORDER — LORAZEPAM 2 MG/ML IJ SOLN: 1.0000 mg | Freq: Once | INTRAMUSCULAR | Status: DC

## 2022-10-18 NOTE — ED Triage Notes (Signed)
Pt via EMS from home c/o fall and unable to get up after a few days of generalized weakness. Currently being treated for lung CA. Multiple recent falls; left forehead abrasions noted due to a fall over 24 hours ago at home. Pt takes xarelto. Pt denies pain related to falls and states primary complaint is weakness.

## 2022-10-18 NOTE — ED Notes (Signed)
Patient transported to MRI 

## 2022-10-18 NOTE — ED Provider Notes (Signed)
[name] DEPT Provider Note   CSN: [id number] Arrival date & time: 10/17/2022  1411     History  Chief Complaint  Patient presents with   Weakness    [name] is a 81 y.o. male history of lung cancer, hypertension, diabetes, A-fib on Xarelto here presenting with fall.  Patient had several falls yesterday.  During one of his falls yesterday, he hit his head.  He has been laying on the floor since then.  They were unable to get him up today.  EMS was called.  He was noted to have some slurred speech.   The history is provided by the patient.       Home Medications Prior to Admission medications   Medication Sig Start Date End Date Taking? Authorizing Provider  ACCU-CHEK SMARTVIEW test strip CHECK 1 TO 2 TIMES A DAY 03/12/21   [provider]  albuterol (VENTOLIN HFA) 108 (90 Base) MCG/ACT inhaler 1-2 puff as needed 02/17/21   [provider]  amiodarone (PACERONE) 200 MG tablet TAKE 1 TABLET BY MOUTH AT BEDTIME 06/18/22   Belva Crome, MD  amitriptyline (ELAVIL) 25 MG tablet Take 25 mg by mouth at bedtime. 05/17/22   [provider]  atorvastatin (LIPITOR) 10 MG tablet Take 10 mg by mouth in the morning. 06/13/21   [provider]  Cholecalciferol (VITAMIN D3) 50 MCG (2000 UT) TABS Take 2,000 Units by mouth daily.    [provider]  Coenzyme Q10 (COQ10) 200 MG CAPS Take 200 mg by mouth in the morning.    [provider]  Cyanocobalamin (B-12) 5000 MCG CAPS Take 5,000 mcg by mouth daily. 04/27/21   Angiulli, Lavon Paganini, PA-C  famotidine (PEPCID) 20 MG tablet Take 20 mg by mouth daily as needed for heartburn or indigestion.    [provider]  finasteride (PROPECIA) 1 MG tablet Take 1 mg by mouth daily.    [provider]  furosemide (LASIX) 40 MG tablet Take 1 tablet (40 mg total) by mouth daily as needed (fluid retention/edema). 07/09/21   Collene Gobble, MD  glimepiride (AMARYL) 2  MG tablet Take 3 tablets (6 mg total) by mouth daily with breakfast. 04/27/21   Angiulli, Lavon Paganini, PA-C  JARDIANCE 25 MG TABS tablet Take 25 mg by mouth every evening. 05/01/21   [provider]  lactose free nutrition (BOOST) LIQD Take 237 mLs by mouth in the morning.    [provider]  levothyroxine (SYNTHROID) 100 MCG tablet Take 100 mcg by mouth every morning. 02/21/22   [provider]  levothyroxine (SYNTHROID) 112 MCG tablet Take 112 mcg by mouth every morning. 04/24/22   [provider]  Lidocaine HCl 4 % CREA Apply 1 application topically 4 (four) times daily as needed (pain.).    [provider]  metFORMIN (GLUCOPHAGE-XR) 500 MG 24 hr tablet Take 500-1,000 mg by mouth See admin instructions. Take 2 tablets (1000 mg) by mouth in the morning & take 1 tablet (500 mg) by mouth in the evening. 05/10/21   [provider]  metoprolol tartrate (LOPRESSOR) 25 MG tablet Take 1 tablet (25 mg total) by mouth 2 (two) times daily. 12/31/21   Belva Crome, MD  mirtazapine (REMERON) 15 MG tablet Take 1 tablet (15 mg total) by mouth at bedtime. 04/27/21   Angiulli, Lavon Paganini, PA-C  Multiple Vitamin (MULTIVITAMIN WITH MINERALS) TABS tablet Take 1 tablet by mouth daily.    [provider]  ondansetron (ZOFRAN ODT) 4 MG disintegrating tablet Take 1 tablet (4 mg total) by mouth every 8 (eight) hours as needed for nausea or vomiting. Patient not taking: Reported on 05/08/2022 07/13/21   Horton, Alvin Critchley, DO  ondansetron (ZOFRAN) 4 MG tablet Take 4 mg by mouth every 6 (six) hours as needed. Patient not taking: Reported on 05/08/2022 07/13/21   [provider]  oxyCODONE-acetaminophen (PERCOCET) 7.5-325 MG tablet Take 1-2 tablets by mouth at bedtime as needed (pain.). 05/01/21   [provider]  OZEMPIC, 0.25 OR 0.5 MG/DOSE, 2 MG/1.5ML SOPN Inject 0.5 mg into the skin every Saturday. 06/26/21   [provider]  pregabalin (LYRICA) 300 MG  capsule Take 300 mg by mouth 2 (two) times daily. 06/08/21   [provider]  RELION PEN NEEDLES 32G X 4 MM MISC  05/06/22   [provider]  rivaroxaban (XARELTO) 20 MG TABS tablet Take 1 tablet (20 mg total) by mouth every evening. OK to restart this medication on 01/01/2022 12/31/21   Collene Gobble, MD  senna-docusate (SENOKOT-S) 8.6-50 MG tablet Take 1 tablet by mouth daily as needed (constipation.). 07/09/21   Collene Gobble, MD  traZODone (DESYREL) 50 MG tablet Take 50 mg by mouth at bedtime. 04/24/22   [provider]      Allergies    Cymbalta [duloxetine hcl], Neurontin [gabapentin], Atorvastatin, Ciprofloxacin, Keppra [levetiracetam], Pioglitazone, and Simvastatin    Review of Systems   Review of Systems  Neurological:  Positive for weakness.  All other systems reviewed and are negative.   Physical Exam Updated Vital Signs BP 118/74 (BP Location: Right Arm)   Pulse 76   Temp 98.2 F (36.8 C) (Oral)   Resp 16   Ht 6' (1.829 m)   Wt 90.7 kg   SpO2 97%   BMI 27.12 kg/m  Physical Exam Vitals and nursing note reviewed.  Constitutional:      Appearance: Normal appearance.  HENT:     Head: Normocephalic.     Comments: Abrasion on the left temporal area.  There is no obvious laceration    Mouth/Throat:     Mouth: Mucous membranes are dry.  Eyes:     Extraocular Movements: Extraocular movements intact.     Pupils: Pupils are equal, round, and reactive to light.  Cardiovascular:     Rate and Rhythm: Normal rate and regular rhythm.     Pulses: Normal pulses.     Heart sounds: Normal heart sounds.  Pulmonary:     Effort: Pulmonary effort is normal.     Breath sounds: Normal breath sounds.  Abdominal:     General: Abdomen is flat.     Palpations: Abdomen is soft.  Musculoskeletal:     Cervical back: Normal range of motion and neck supple.     Comments: No obvious spinal tenderness and no obvious extremity trauma  Skin:    General: Skin is warm.      Capillary Refill: Capillary refill takes less than 2 seconds.  Neurological:     Mental Status: He is alert.     Comments: Patient has some slurred speech.  Questionable left sided facial droop as well patient strength is 4 out of 5 bilateral arms and legs  Psychiatric:        Mood and Affect: Mood normal.        Behavior: Behavior normal.     ED Results / Procedures / Treatments   Labs (all labs ordered are listed, but  only abnormal results are displayed) Labs Reviewed  BASIC METABOLIC PANEL - Abnormal; Notable for the following components:      Result Value   Glucose, Bld 228 (*)    BUN 26 (*)    All other components within normal limits  CBC - Abnormal; Notable for the following components:   RBC 3.92 (*)    Hemoglobin 12.7 (*)    HCT 38.0 (*)    All other components within normal limits  PROTIME-INR - Abnormal; Notable for the following components:   Prothrombin Time 21.9 (*)    INR 1.9 (*)    All other components within normal limits  CBG MONITORING, ED - Abnormal; Notable for the following components:   Glucose-Capillary 204 (*)    All other components within normal limits  LACTIC ACID, PLASMA  CK  URINALYSIS, ROUTINE W REFLEX MICROSCOPIC  LACTIC ACID, PLASMA    EKG EKG Interpretation  Date/Time:  Friday October 18 2022 14:26:13 EST Ventricular Rate:  85 PR Interval:  191 QRS Duration: 114 QT Interval:  391 QTC Calculation: 465 R Axis:   -55 Text Interpretation: Sinus rhythm Borderline IVCD with LAD Inferior infarct, old Abnormal lateral Q waves Anterior infarct, old No significant change since last tracing Confirmed by Wandra Arthurs [phone]) on 10/10/2022 4:51:44 PM  Radiology No results found.  Procedures Procedures    Medications Ordered in ED Medications  sodium chloride 0.9 % bolus 1,000 mL (has no administration in time range)    ED Course/ Medical Decision Making/ A&P                           Medical Decision Making [name] is a  81 y.o. male here presenting with follow-up.  Patient is on Xarelto for A-fib.  Concern for possible head bleed. Plan to get CT head and cervical spine and labs and chest x-ray pelvis x-ray and CK level.   7:26 PM Labs unremarkable.  CT head showed no bleeding.  Patient still has some slurred speech.  I consulted Dr. Quinn Axe from neurology.  She recommends monitoring for possible postconcussive syndrome and get the MRI as well.  If MRI is positive, patient will need transfer to [facility].  Otherwise he can stay at [facility] long.  Problems Addressed: Postconcussive syndrome: acute illness or injury  Amount and/or Complexity of Data Reviewed Labs: ordered. Decision-making details documented in ED Course. Radiology: ordered and independent interpretation performed. Decision-making details documented in ED Course. ECG/medicine tests: ordered and independent interpretation performed. Decision-making details documented in ED Course.    Final Clinical Impression(s) / ED Diagnoses Final diagnoses:  None    Rx / DC Orders ED Discharge Orders     None         Drenda Freeze, MD 10/13/2022 [phone]

## 2022-10-18 NOTE — ED Notes (Signed)
MRI aware of scan

## 2022-10-18 NOTE — ED Notes (Signed)
Pt returned from MRI °

## 2022-10-19 ENCOUNTER — Encounter (HOSPITAL_COMMUNITY): Payer: Self-pay | Admitting: Internal Medicine

## 2022-10-19 DIAGNOSIS — G9341 Metabolic encephalopathy: Principal | ICD-10-CM

## 2022-10-19 DIAGNOSIS — Y92009 Unspecified place in unspecified non-institutional (private) residence as the place of occurrence of the external cause: Secondary | ICD-10-CM

## 2022-10-19 DIAGNOSIS — F0781 Postconcussional syndrome: Secondary | ICD-10-CM | POA: Diagnosis not present

## 2022-10-19 DIAGNOSIS — W19XXXA Unspecified fall, initial encounter: Secondary | ICD-10-CM | POA: Diagnosis not present

## 2022-10-19 DIAGNOSIS — Z79899 Other long term (current) drug therapy: Secondary | ICD-10-CM

## 2022-10-19 DIAGNOSIS — R296 Repeated falls: Secondary | ICD-10-CM

## 2022-10-19 DIAGNOSIS — R338 Other retention of urine: Secondary | ICD-10-CM | POA: Diagnosis present

## 2022-10-19 DIAGNOSIS — E1140 Type 2 diabetes mellitus with diabetic neuropathy, unspecified: Secondary | ICD-10-CM

## 2022-10-19 LAB — COMPREHENSIVE METABOLIC PANEL
ALT: 15 U/L (ref 0–44)
AST: 12 U/L — ABNORMAL LOW (ref 15–41)
Albumin: 3.4 g/dL — ABNORMAL LOW (ref 3.5–5.0)
Alkaline Phosphatase: 80 U/L (ref 38–126)
Anion gap: 10 (ref 5–15)
BUN: 29 mg/dL — ABNORMAL HIGH (ref 8–23)
CO2: 25 mmol/L (ref 22–32)
Calcium: 9.4 mg/dL (ref 8.9–10.3)
Chloride: 105 mmol/L (ref 98–111)
Creatinine, Ser: 0.89 mg/dL (ref 0.61–1.24)
GFR, Estimated: >60 mL/min (ref >60)
Glucose, Bld: 146 mg/dL — ABNORMAL HIGH (ref 70–99)
Potassium: 3.7 mmol/L (ref 3.5–5.1)
Sodium: 140 mmol/L (ref 135–145)
Total Bilirubin: 0.9 mg/dL (ref 0.3–1.2)
Total Protein: 7.1 g/dL (ref 6.5–8.1)

## 2022-10-19 LAB — BRAIN NATRIURETIC PEPTIDE
B Natriuretic Peptide: 151.8 pg/mL — ABNORMAL HIGH (ref 0.0–100.0)
B Natriuretic Peptide: 156.5 pg/mL — ABNORMAL HIGH (ref 0.0–100.0)

## 2022-10-19 LAB — RESP PANEL BY RT-PCR (RSV, FLU A&B, COVID)  RVPGX2
Influenza A by PCR: NEGATIVE
Influenza B by PCR: NEGATIVE
Resp Syncytial Virus by PCR: NEGATIVE
SARS Coronavirus 2 by RT PCR: NEGATIVE

## 2022-10-19 LAB — CBC
HCT: 35.2 % — ABNORMAL LOW (ref 39.0–52.0)
Hemoglobin: 11.9 g/dL — ABNORMAL LOW (ref 13.0–17.0)
MCH: 32.3 pg (ref 26.0–34.0)
MCHC: 33.8 g/dL (ref 30.0–36.0)
MCV: 95.7 fL (ref 80.0–100.0)
Platelets: 160 10*3/uL (ref 150–400)
RBC: 3.68 MIL/uL — ABNORMAL LOW (ref 4.22–5.81)
RDW: 15.4 % (ref 11.5–15.5)
WBC: 5.6 10*3/uL (ref 4.0–10.5)
nRBC: 0 % (ref 0.0–0.2)

## 2022-10-19 LAB — BLOOD GAS, VENOUS
Acid-Base Excess: 6.4 mmol/L — ABNORMAL HIGH (ref 0.0–2.0)
Bicarbonate: 31.3 mmol/L — ABNORMAL HIGH (ref 20.0–28.0)
O2 Saturation: 92.5 %
Patient temperature: 36.4
pCO2, Ven: 44 mmHg (ref 44–60)
pH, Ven: 7.46 — ABNORMAL HIGH (ref 7.25–7.43)
pO2, Ven: 67 mmHg — ABNORMAL HIGH (ref 32–45)

## 2022-10-19 LAB — TROPONIN I (HIGH SENSITIVITY)
Troponin I (High Sensitivity): 7 ng/L (ref <18)
Troponin I (High Sensitivity): 6 ng/L (ref <18)

## 2022-10-19 LAB — VITAMIN B12: Vitamin B-12: 748 pg/mL (ref 180–914)

## 2022-10-19 LAB — FOLATE: Folate: 29.3 ng/mL (ref >5.9)

## 2022-10-19 LAB — TSH
TSH: 1.656 u[IU]/mL (ref 0.350–4.500)
TSH: 1.619 u[IU]/mL (ref 0.350–4.500)

## 2022-10-19 MED ORDER — OXYCODONE-ACETAMINOPHEN 5-325 MG PO TABS
1.0000 | ORAL_TABLET | Freq: Three times a day (TID) | ORAL | Status: DC | PRN
  Administered 2022-10-19: 1 via ORAL
  Filled 2022-10-19: qty 1

## 2022-10-19 MED ORDER — FUROSEMIDE 40 MG PO TABS: 40.0000 mg | ORAL_TABLET | Freq: Every day | ORAL | Status: DC | PRN

## 2022-10-19 MED ORDER — AMIODARONE HCL 200 MG PO TABS
200.0000 mg | ORAL_TABLET | Freq: Every day | ORAL | Status: DC
  Administered 2022-10-19: 200 mg via ORAL
  Filled 2022-10-19: qty 1

## 2022-10-19 MED ORDER — FAMOTIDINE 20 MG PO TABS: 20.0000 mg | ORAL_TABLET | Freq: Every day | ORAL | Status: DC | PRN

## 2022-10-19 MED ORDER — RIVAROXABAN 20 MG PO TABS
20.0000 mg | ORAL_TABLET | Freq: Every evening | ORAL | Status: DC
  Administered 2022-10-19: 20 mg via ORAL
  Filled 2022-10-19: qty 1

## 2022-10-19 MED ORDER — FAMOTIDINE 20 MG PO TABS
20.0000 mg | ORAL_TABLET | Freq: Every day | ORAL | Status: DC
  Administered 2022-10-19 – 2022-10-20 (×2): 20 mg via ORAL
  Filled 2022-10-19 (×2): qty 1

## 2022-10-19 MED ORDER — DIAZEPAM 5 MG/ML IJ SOLN
2.5000 mg | Freq: Four times a day (QID) | INTRAMUSCULAR | Status: AC
  Administered 2022-10-19 – 2022-10-20 (×4): 2.5 mg via INTRAVENOUS
  Filled 2022-10-19 (×4): qty 2

## 2022-10-19 MED ORDER — ACETAMINOPHEN 10 MG/ML IV SOLN
1000.0000 mg | Freq: Four times a day (QID) | INTRAVENOUS | Status: AC
  Administered 2022-10-19 – 2022-10-20 (×4): 1000 mg via INTRAVENOUS
  Filled 2022-10-19 (×4): qty 100

## 2022-10-19 MED ORDER — KETOROLAC TROMETHAMINE 15 MG/ML IJ SOLN
15.0000 mg | Freq: Four times a day (QID) | INTRAMUSCULAR | Status: AC
  Administered 2022-10-19 – 2022-10-20 (×4): 15 mg via INTRAVENOUS
  Filled 2022-10-19 (×4): qty 1

## 2022-10-19 MED ORDER — HALOPERIDOL LACTATE 5 MG/ML IJ SOLN
2.0000 mg | Freq: Four times a day (QID) | INTRAMUSCULAR | Status: DC | PRN
  Filled 2022-10-19: qty 1

## 2022-10-19 MED ORDER — VITAMIN B-12 1000 MCG PO TABS
5000.0000 ug | ORAL_TABLET | Freq: Every day | ORAL | Status: DC
  Filled 2022-10-19: qty 5

## 2022-10-19 MED ORDER — OXYCODONE HCL 5 MG PO TABS: 5.0000 mg | ORAL_TABLET | Freq: Three times a day (TID) | ORAL | Status: DC | PRN

## 2022-10-19 MED ORDER — SENNOSIDES-DOCUSATE SODIUM 8.6-50 MG PO TABS: 1.0000 | ORAL_TABLET | Freq: Every day | ORAL | Status: DC | PRN

## 2022-10-19 MED ORDER — METOPROLOL TARTRATE 25 MG PO TABS
25.0000 mg | ORAL_TABLET | Freq: Two times a day (BID) | ORAL | Status: DC
  Administered 2022-10-19 – 2022-10-20 (×4): 25 mg via ORAL
  Filled 2022-10-19 (×4): qty 1

## 2022-10-19 MED ORDER — ADULT MULTIVITAMIN W/MINERALS CH
1.0000 | ORAL_TABLET | Freq: Every day | ORAL | Status: DC
  Administered 2022-10-19 – 2022-10-20 (×2): 1 via ORAL
  Filled 2022-10-19 (×2): qty 1

## 2022-10-19 MED ORDER — HYDROMORPHONE HCL 1 MG/ML IJ SOLN
0.5000 mg | INTRAMUSCULAR | Status: AC
  Administered 2022-10-19: 0.5 mg via INTRAVENOUS
  Filled 2022-10-19: qty 0.5

## 2022-10-19 MED ORDER — B-12 5000 MCG PO CAPS: 5000.0000 ug | ORAL_CAPSULE | Freq: Every day | ORAL | Status: DC

## 2022-10-19 MED ORDER — HALOPERIDOL LACTATE 5 MG/ML IJ SOLN
2.0000 mg | Freq: Four times a day (QID) | INTRAMUSCULAR | Status: DC | PRN
  Administered 2022-10-19 – 2022-10-21 (×6): 2 mg via INTRAMUSCULAR
  Filled 2022-10-19 (×7): qty 1

## 2022-10-19 MED ORDER — SENNOSIDES-DOCUSATE SODIUM 8.6-50 MG PO TABS
2.0000 | ORAL_TABLET | Freq: Every day | ORAL | Status: DC
  Administered 2022-10-19: 2 via ORAL
  Filled 2022-10-19: qty 2

## 2022-10-19 MED ORDER — ACETAMINOPHEN 10 MG/ML IV SOLN: 1000.0000 mg | INTRAVENOUS | Status: DC

## 2022-10-19 MED ORDER — LORAZEPAM 2 MG/ML IJ SOLN
0.5000 mg | Freq: Four times a day (QID) | INTRAMUSCULAR | Status: AC | PRN
  Administered 2022-10-19: 0.5 mg via INTRAVENOUS
  Filled 2022-10-19: qty 1

## 2022-10-19 MED ORDER — ALBUTEROL SULFATE (2.5 MG/3ML) 0.083% IN NEBU: 2.5000 mg | INHALATION_SOLUTION | Freq: Four times a day (QID) | RESPIRATORY_TRACT | Status: DC | PRN

## 2022-10-19 MED ORDER — VITAMIN D 25 MCG (1000 UNIT) PO TABS
2000.0000 [IU] | ORAL_TABLET | Freq: Every day | ORAL | Status: DC
  Administered 2022-10-19 – 2022-10-20 (×2): 2000 [IU] via ORAL
  Filled 2022-10-19 (×2): qty 2

## 2022-10-19 MED ORDER — ATORVASTATIN CALCIUM 10 MG PO TABS
10.0000 mg | ORAL_TABLET | Freq: Every day | ORAL | Status: DC
  Administered 2022-10-19 – 2022-10-20 (×2): 10 mg via ORAL
  Filled 2022-10-19 (×2): qty 1

## 2022-10-19 MED ORDER — INSULIN GLARGINE-LIXISENATIDE 100-33 UNT-MCG/ML ~~LOC~~ SOPN: 22.0000 [IU] | PEN_INJECTOR | Freq: Every day | SUBCUTANEOUS | Status: DC

## 2022-10-19 MED ORDER — OXYCODONE-ACETAMINOPHEN 10-325 MG PO TABS: 1.0000 | ORAL_TABLET | Freq: Three times a day (TID) | ORAL | Status: DC | PRN

## 2022-10-19 MED ORDER — EMPAGLIFLOZIN 25 MG PO TABS
25.0000 mg | ORAL_TABLET | Freq: Every evening | ORAL | Status: DC
  Administered 2022-10-19: 25 mg via ORAL
  Filled 2022-10-19 (×2): qty 1

## 2022-10-19 MED ORDER — LEVOTHYROXINE SODIUM 112 MCG PO TABS
112.0000 ug | ORAL_TABLET | Freq: Every morning | ORAL | Status: DC
  Administered 2022-10-19 – 2022-10-20 (×2): 112 ug via ORAL
  Filled 2022-10-19 (×2): qty 1

## 2022-10-19 MED ORDER — ALBUTEROL SULFATE HFA 108 (90 BASE) MCG/ACT IN AERS: 2.0000 | INHALATION_SPRAY | Freq: Four times a day (QID) | RESPIRATORY_TRACT | Status: DC | PRN

## 2022-10-19 NOTE — H&P (Signed)
History and Physical    [name] OIT:[id number] DOB: [birth year] DOA: 10/03/2022  PCP: Kathalene Frames, MD  Patient coming from: home  I have personally briefly reviewed patient's old medical records in [place]  Chief Complaint: multiple falls at home and change in ms   HPI: [name] is a 81 y.o. 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 Of note on last fall he hit his head and was unable to get up and was immobilized  on floor for overnight , an unknown period of time. Family in am called EMS. He was noted in field to have slurred speech. Per wife he had had progressive decline over the last 2 weeks at home. She notes at baseline he has clear speech but is at baseline confused. ED Course:  In ED per EDP patient note at baseline noted to have confusion and mild form of expressive aphasia.  Initial working Dx post concussion syndrome vs TIA/CVA.  Case discussed with neurology who recommended MRI as CTH noted to be negative.  MRI was also found to be unremarkable.    Vitals: afeb, bp 115/72, hr 82, rr 16 sat 97% on ra (78) RAX:ENMMH rhythm IVCD Inr 1.9 N a 137, K 3.9, cl 100, bicarb 27, glu 228, bun 26, cr 0.99 Wbc 7, hg 12.7 Lactic 1.7 CK49 Cxr IMPRESSION: 1. Stable left lung consolidation, consistent with post treatment changes based on recent PET scan findings. 2. No new airspace disease. Pelvis  FINDINGS: Postsurgical changes are noted in the lower lumbar spine. Pelvic ring is intact. No acute fracture or dislocation is seen. No soft tissue abnormality is noted. Degenerative changes of the hip joints are noted bilaterally.   IMPRESSION: Degenerative change without acute abnormality.  CT head:   1. No  acute intracranial abnormality. Generalized cerebral atrophy and chronic microvascular ischemic changes of the white matter.   CT cervical spine:   1. No acute fracture or subluxation of the cervical spine. 2. Moderate-to-severe multilevel degenerative disc and joint disease of the cervical spine as described above.   MRI IMPRESSION: 1. Motion degraded exam. No acute intracranial abnormality. 2. Age-related cerebral atrophy with mild chronic small vessel ischemic disease. 3. Remote posttraumatic findings as above, stable.  Review of Systems: As per HPI otherwise 10 point review of systems negative.   Past Medical History:  Diagnosis Date   AAA (abdominal aortic aneurysm) ([place])    a. s/p repair 2009.   Acid reflux    takes Prilosec and Protonix daily   Arthritis    BacK   CAD in native artery    a. a. prior inf MI 1993. b. PCI to LAD 05/1997. c. recurrent inferolateral MI complicated by V. fib arrest in 04/1998, prior PCI to OM1. d. 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   BRONCHIAL BIOPSY  07/09/2021   Procedure: BRONCHIAL BIOPSIES;  Surgeon: Collene Gobble, MD;  Location: [facility] ENDOSCOPY;  Service: Pulmonary;;   BRONCHIAL BIOPSY  12/31/2021   Procedure: BRONCHIAL BIOPSIES;  Surgeon: Collene Gobble, MD;  Location: [facility] ENDOSCOPY;  Service: Pulmonary;;   BRONCHIAL BRUSHINGS  07/09/2021   Procedure: BRONCHIAL BRUSHINGS;  Surgeon: Collene Gobble, MD;  Location: [facility] ENDOSCOPY;  Service: Pulmonary;;   BRONCHIAL BRUSHINGS  12/31/2021   Procedure: BRONCHIAL BRUSHINGS;  Surgeon: Collene Gobble, MD;  Location: [facility] ENDOSCOPY;  Service: Pulmonary;;   BRONCHIAL NEEDLE ASPIRATION BIOPSY  07/09/2021   Procedure: BRONCHIAL NEEDLE ASPIRATION BIOPSIES;  Surgeon: Collene Gobble, MD;  Location: [facility] ENDOSCOPY;  Service: Pulmonary;;   BRONCHIAL NEEDLE ASPIRATION BIOPSY  12/31/2021   Procedure: BRONCHIAL NEEDLE ASPIRATION BIOPSIES;  Surgeon: Collene Gobble, MD;  Location: [facility] ENDOSCOPY;  Service: Pulmonary;;   BRONCHIAL WASHINGS  12/31/2021   Procedure: BRONCHIAL WASHINGS;  Surgeon: Collene Gobble, MD;  Location: [place];  Service: Pulmonary;;   CARDIAC CATHETERIZATION  2009/2012   CARDIOVERSION N/A 04/12/2015   Procedure: CARDIOVERSION;  Surgeon: Sueanne Margarita, MD;  Location: [place];  Service: Cardiovascular;  Laterality: N/A;   CARDIOVERSION N/A 12/26/2016   Procedure: CARDIOVERSION;  Surgeon: Sueanne Margarita, MD;  Location: [place];  Service: Cardiovascular;  Laterality: N/A;   COLONOSCOPY     CORONARY ANGIOPLASTY WITH [place]; 1997   FIDUCIAL MARKER PLACEMENT  07/09/2021   Procedure: FIDUCIAL MARKER PLACEMENT;  Surgeon: Collene Gobble, MD;  Location: [place];  Service: Pulmonary;;   FIDUCIAL MARKER PLACEMENT  12/31/2021   Procedure: FIDUCIAL MARKER PLACEMENT;  Surgeon: Collene Gobble, MD;  Location: [facility] ENDOSCOPY;  Service: Pulmonary;;   LAPAROSCOPIC CHOLECYSTECTOMY     LUMBAR  LAMINECTOMY/DECOMPRESSION MICRODISCECTOMY Right 01/16/2016   Procedure: Right Lumbar five-Sacral one Laminectomy;  Surgeon: Kristeen Miss, MD;  Location: [facility] NEURO ORS;  Service: Neurosurgery;  Laterality: Right;  Right L5-S1 Laminectomy   VIDEO BRONCHOSCOPY WITH ENDOBRONCHIAL NAVIGATION N/A 07/09/2021   Procedure: ROBOTIC VIDEO BRONCHOSCOPY WITH ENDOBRONCHIAL NAVIGATION;  Surgeon: Collene Gobble, MD;  Location: [place] ENDOSCOPY;  Service: Pulmonary;  Laterality: N/A;   VIDEO BRONCHOSCOPY WITH RADIAL ENDOBRONCHIAL ULTRASOUND  07/09/2021   Procedure: VIDEO BRONCHOSCOPY WITH RADIAL ENDOBRONCHIAL ULTRASOUND;  Surgeon: Collene Gobble, MD;  Location: [facility] ENDOSCOPY;  Service: Pulmonary;;   VIDEO BRONCHOSCOPY WITH RADIAL ENDOBRONCHIAL ULTRASOUND  12/31/2021   Procedure: VIDEO BRONCHOSCOPY WITH RADIAL ENDOBRONCHIAL ULTRASOUND;  Surgeon: Collene Gobble, MD;  Location: [place] ENDOSCOPY;  Service: Pulmonary;;     reports that he quit smoking about 30  years ago. His smoking use included cigarettes. He has a 33.00 pack-year smoking history. He has never used smokeless tobacco. He reports that he does not currently use alcohol. He reports that he does not use drugs.  Allergies  Allergen Reactions   Cymbalta [Duloxetine Hcl] Nausea And Vomiting and Other (See Comments)    PATIENT STATED THAT HE FELT LIKE HE "WAS GOING TO DIE" Rapid drop in blood pressure; sent him to the ER X2   Neurontin [Gabapentin] Other (See Comments)    "Makes me goofy, keeps me off balance, clouds my thinking.."   Atorvastatin Other (See Comments)   Ciprofloxacin Nausea And Vomiting   Keppra [Levetiracetam] Nausea And Vomiting   Pioglitazone Other (See Comments)   Simvastatin Other (See Comments)    Family History  Problem Relation Age of Onset   Diabetes Mother    Coronary artery disease Mother    Diabetes Father    Arthritis Father    Rheum arthritis Father    Coronary artery disease Father     Prior to Admission medications    Medication Sig Start Date End Date Taking? Authorizing Provider  albuterol (VENTOLIN HFA) 108 (90 Base) MCG/ACT inhaler Inhale 2 puffs into the lungs every 6 (six) hours as needed for wheezing or shortness of breath. 02/17/21  Yes [provider]  amiodarone (PACERONE) 200 MG tablet TAKE 1 TABLET BY MOUTH AT BEDTIME 06/18/22  Yes Belva Crome, MD  amitriptyline (ELAVIL) 25 MG tablet Take 25 mg by mouth at bedtime. 05/17/22  Yes [provider]  atorvastatin (LIPITOR) 10 MG tablet Take 10 mg by mouth in the morning. 06/13/21  Yes [provider]  Cholecalciferol (VITAMIN D3) 50 MCG (2000 UT) TABS Take 2,000 Units by mouth daily.   Yes [provider]  Coenzyme Q10 (COQ10) 200 MG CAPS Take 200 mg by mouth in the morning.   Yes [provider]  Cyanocobalamin (B-12) 5000 MCG CAPS Take 5,000 mcg by mouth daily. 04/27/21  Yes Angiulli, Lavon Paganini, PA-C  famotidine (PEPCID) 20 MG tablet Take 20 mg by mouth daily as needed for heartburn or indigestion.   Yes [provider]  finasteride (PROPECIA) 1 MG tablet Take 1 mg by mouth daily.   Yes [provider]  furosemide (LASIX) 40 MG tablet Take 1 tablet (40 mg total) by mouth daily as needed (fluid retention/edema). Patient taking differently: Take 40 mg by mouth daily. 07/09/21  Yes Collene Gobble, MD  JARDIANCE 25 MG TABS tablet Take 25 mg by mouth every evening. 05/01/21  Yes [provider]  lactose free nutrition (BOOST) LIQD Take 237 mLs by mouth in the morning.   Yes [provider]  levothyroxine (SYNTHROID) 112 MCG tablet Take 112 mcg by mouth every morning. 04/24/22  Yes [provider]  Lidocaine HCl 4 % CREA Apply 1 application topically 4 (four) times daily as needed (pain.).   Yes [provider]  metFORMIN (GLUCOPHAGE-XR) 500 MG 24 hr tablet Take 1,000 mg by mouth 2 (two) times daily with a meal. 05/10/21  Yes [provider]  metoprolol  tartrate (LOPRESSOR) 25 MG tablet Take 1 tablet (25 mg total) by mouth 2 (two) times daily. 12/31/21  Yes Belva Crome, MD  Multiple Vitamin (MULTIVITAMIN WITH MINERALS) TABS tablet Take 1 tablet by mouth daily.   Yes [provider]  oxyCODONE-acetaminophen (PERCOCET) 10-325 MG tablet Take 1 tablet by mouth 3 (three) times daily as needed for pain. 10/16/22  Yes [provider]  pregabalin (LYRICA) 300 MG capsule Take 300 mg by mouth 2 (two) times daily. 06/08/21  Yes [provider]  rivaroxaban (XARELTO) 20 MG TABS tablet Take 1 tablet (20 mg total) by mouth every evening. OK to restart this medication on 01/01/2022 12/31/21  Yes Collene Gobble, MD  senna-docusate (SENOKOT-S) 8.6-50 MG tablet Take 1 tablet by mouth daily as needed (constipation.). 07/09/21  Yes Collene Gobble, MD  SOLIQUA 100-33 UNT-MCG/ML SOPN Inject 22 Units into the skin at bedtime. 10/02/22  Yes [provider]  traZODone (DESYREL) 50 MG tablet Take 100 mg by mouth at bedtime. 04/24/22  Yes [provider]  ACCU-CHEK SMARTVIEW test strip CHECK 1 TO 2 TIMES A DAY 03/12/21   [provider]  glimepiride (AMARYL) 2 MG tablet Take 3 tablets (6 mg total) by mouth daily with breakfast. Patient not taking: Reported on 10/01/2022 04/27/21   Angiulli, Lavon Paganini, PA-C  mirtazapine (REMERON) 15 MG tablet Take 1 tablet (15 mg total) by mouth at bedtime. Patient not taking: Reported on 10/13/2022 04/27/21   Cathlyn Parsons, PA-C  ondansetron (ZOFRAN ODT) 4 MG disintegrating tablet Take 1 tablet (4 mg total) by mouth every 8 (eight) hours as needed for nausea or vomiting. Patient not taking: Reported on 05/08/2022 07/13/21   Horton, Alvin Critchley, DO  RELION PEN NEEDLES 32G X 4 MM MISC  05/06/22   [provider]    Physical Exam: Vitals:   10/23/2022 2030 10/17/2022 2230 10/07/2022 2249 10/19/22 0013  BP: 120/69 124/85  (!) 141/76  Pulse: 75 86  92  Resp: (!) 22 16  18   Temp:   98.3 F (36.8  C) 98 F (36.7 C)  TempSrc:    Oral  SpO2: 97% 99%  94%  Weight:      Height:    6' (1.829 m)    Constitutional: NAD, calm, resting comfortablely Vitals:   10/26/2022 2030 10/15/2022 2230 09/27/2022 2249 10/19/22 0013  BP: 120/69 124/85  (!) 141/76  Pulse: 75 86  92  Resp: (!) 22 16  18   Temp:   98.3 F (36.8 C) 98 F (36.7 C)  TempSrc:    Oral  SpO2: 97% 99%  94%  Weight:      Height:    6' (1.829 m)   Eyes: PERRL, lids and conjunctivae normal ENMT: Mucous membranes are moist. Posterior pharynx clear of any exudate or lesions.Normal dentition.  Neck: normal, supple, no masses, no thyromegaly Respiratory: clear to auscultation bilaterally, no wheezing, no crackles. Normal respiratory effort. No accessory muscle use.  Cardiovascular: Regular rate and rhythm, no murmurs / rubs / gallops. No extremity edema. 2+ pedal pulses.  Abdomen: no tenderness, no masses palpated. No hepatosplenomegaly. Bowel sounds positive.  Musculoskeletal: no clubbing / cyanosis. No joint deformity upper and lower extremities. Good ROM, no contractures. Normal muscle tone.  Skin: no rashes, lesions, ulcers. No induration Neurologic: CN 2-12 grossly intact. Sensation intact, DTR normal. Strength 5/5 in all 4.  Psychiatric: unable to assess at this time    Labs on Admission: I have personally reviewed following labs and imaging studies  CBC: Recent Labs  Lab 10/22/2022 1430  WBC 7.0  HGB 12.7*  HCT 38.0*  MCV 96.9  PLT 710   Basic Metabolic Panel: Recent Labs  Lab 10/02/2022 1430  NA 137  K 3.9  CL 100  CO2 27  GLUCOSE 228*  BUN 26*  CREATININE 0.99  CALCIUM 9.3  GFR: Estimated Creatinine Clearance: 62.1 mL/min (by C-G formula based on SCr of 0.99 mg/dL). Liver Function Tests: No results for input(s): "AST", "ALT", "ALKPHOS", "BILITOT", "PROT", "ALBUMIN" in the last 168 hours. No results for input(s): "LIPASE", "AMYLASE" in the last 168 hours. No results for input(s): "AMMONIA" in the last  168 hours. Coagulation Profile: Recent Labs  Lab 10/22/2022 1430  INR 1.9*   Cardiac Enzymes: Recent Labs  Lab 10/14/2022 1430  CKTOTAL 49   BNP (last 3 results) No results for input(s): "PROBNP" in the last 8760 hours. HbA1C: No results for input(s): "HGBA1C" in the last 72 hours. CBG: Recent Labs  Lab 10/01/2022 1438  GLUCAP 204*   Lipid Profile: No results for input(s): "CHOL", "HDL", "LDLCALC", "TRIG", "CHOLHDL", "LDLDIRECT" in the last 72 hours. Thyroid Function Tests: No results for input(s): "TSH", "T4TOTAL", "FREET4", "T3FREE", "THYROIDAB" in the last 72 hours. Anemia Panel: No results for input(s): "VITAMINB12", "FOLATE", "FERRITIN", "TIBC", "IRON", "RETICCTPCT" in the last 72 hours. Urine analysis:    Component Value Date/Time   COLORURINE STRAW (A) 10/05/2022 1902   APPEARANCEUR CLEAR 10/21/2022 1902   LABSPEC 1.016 10/06/2022 1902   PHURINE 7.0 10/04/2022 1902   GLUCOSEU >=500 (A) 09/28/2022 1902   HGBUR NEGATIVE 09/30/2022 1902   BILIRUBINUR NEGATIVE 10/10/2022 1902   KETONESUR 5 (A) 10/17/2022 1902   PROTEINUR NEGATIVE 10/20/2022 1902   UROBILINOGEN 0.2 12/12/2014 0150   NITRITE NEGATIVE 10/15/2022 1902   LEUKOCYTESUR NEGATIVE 10/12/2022 1902    Radiological Exams on Admission: MR BRAIN WO CONTRAST  Result Date: 10/06/2022 CLINICAL DATA:  Initial evaluation for acute TIA. EXAM: MRI HEAD WITHOUT CONTRAST TECHNIQUE: Multiplanar, multiecho pulse sequences of the brain and surrounding structures were obtained without intravenous contrast. COMPARISON:  Prior CT from earlier the same day. FINDINGS: Brain: Examination moderately degraded by motion artifact. Cerebral volume within normal limits. Patchy and confluent T2/FLAIR hyperintensity involving the periventricular deep white matter both cerebral hemispheres, most consistent with chronic small vessel ischemic disease, mild for age. Focus of encephalomalacia involving the peripheral right temporal lobe noted,  favored to be related to remote trauma. Additional small focus of gliosis involving the anterior left temporal pole also likely posttraumatic in nature. No visible abnormal foci of restricted diffusion to suggest acute or subacute ischemia. Gray-white matter differentiation otherwise maintained. No other areas of chronic cortical infarction. No acute intracranial hemorrhage. Small focus of chronic hemosiderin staining noted at the right temporal occipital region, suggesting prior hemorrhage at this location. Few possible additional punctate chronic micro hemorrhages noted at the anterior left frontal region. These are also suspected to be related to prior trauma. No mass lesion, midline shift or mass effect. No hydrocephalus or extra-axial fluid collection. Pituitary gland and suprasellar region within normal limits. Vascular: Left vertebral artery markedly hypoplastic and not well seen. Major intracranial vascular flow voids are otherwise maintained. Skull and upper cervical spine: Craniocervical junction within normal limits. Bone marrow signal intensity grossly normal. No scalp soft tissue abnormality. Sinuses/Orbits: Globes and orbital soft tissues grossly within normal limits. Mild scattered mucosal thickening noted about the sphenoethmoidal sinuses. Paranasal sinuses are otherwise largely clear. No significant mastoid effusion. Other: None. IMPRESSION: 1. Motion degraded exam. No acute intracranial abnormality. 2. Age-related cerebral atrophy with mild chronic small vessel ischemic disease. 3. Remote posttraumatic findings as above, stable. Electronically Signed   By: Jeannine Boga M.D.   On: 10/08/2022 22:45   CT HEAD WO CONTRAST (5MM)  Result Date: 09/27/2022 CLINICAL DATA:  Head and neck trauma.  Intra cranial injury suspected. EXAM: CT HEAD WITHOUT CONTRAST CT CERVICAL SPINE WITHOUT CONTRAST TECHNIQUE: Multidetector CT imaging of the head and cervical spine was performed following the standard  protocol without intravenous contrast. Multiplanar CT image reconstructions of the cervical spine were also generated. RADIATION DOSE REDUCTION: This exam was performed according to the departmental dose-optimization program which includes automated exposure control, adjustment of the mA and/or kV according to patient size and/or use of iterative reconstruction technique. COMPARISON:  CT head dated July 12, 2022 FINDINGS: CT HEAD FINDINGS Brain: No evidence of acute infarction, hemorrhage, hydrocephalus, extra-axial collection or mass lesion/mass effect. Generalized cerebral atrophy and chronic microvascular ischemic changes of the white matter. Vascular: No hyperdense vessel or unexpected calcification. Skull: Normal. Negative for fracture or focal lesion. Sinuses/Orbits: No acute finding. Other: None CT CERVICAL SPINE FINDINGS Alignment: Normal. Skull base and vertebrae: No acute fracture. No primary bone lesion or focal pathologic process. Soft tissues and spinal canal: No prevertebral fluid or swelling. No visible canal hematoma. Disc levels: C2-C3: Facet joint arthropathy with mild left neural foraminal stenosis. C3-C4: Severe left facet joint arthropathy with moderate neural foraminal stenosis. C4-C5: Severe left facet joint arthropathy with moderate left neural foraminal stenosis. C5-C6: Disc height loss and uncovertebral joint arthropathy. Mild bilateral neural foraminal stenosis. C6-C7: Disc height loss and uncovertebral joint arthropathy with mild-to-moderate bilateral neural foraminal stenosis. C7-T1:  Moderate bilateral facet joint arthropathy. Upper chest: Negative. Other: None IMPRESSION: CT head: 1. No acute intracranial abnormality. Generalized cerebral atrophy and chronic microvascular ischemic changes of the white matter. CT cervical spine: 1. No acute fracture or subluxation of the cervical spine. 2. Moderate-to-severe multilevel degenerative disc and joint disease of the cervical spine as  described above. Electronically Signed   By: Keane Police D.O.   On: 10/07/2022 18:40   CT Cervical Spine Wo Contrast  Result Date: 10/19/2022 CLINICAL DATA:  Head and neck trauma. Intra cranial injury suspected. EXAM: CT HEAD WITHOUT CONTRAST CT CERVICAL SPINE WITHOUT CONTRAST TECHNIQUE: Multidetector CT imaging of the head and cervical spine was performed following the standard protocol without intravenous contrast. Multiplanar CT image reconstructions of the cervical spine were also generated. RADIATION DOSE REDUCTION: This exam was performed according to the departmental dose-optimization program which includes automated exposure control, adjustment of the mA and/or kV according to patient size and/or use of iterative reconstruction technique. COMPARISON:  CT head dated July 12, 2022 FINDINGS: CT HEAD FINDINGS Brain: No evidence of acute infarction, hemorrhage, hydrocephalus, extra-axial collection or mass lesion/mass effect. Generalized cerebral atrophy and chronic microvascular ischemic changes of the white matter. Vascular: No hyperdense vessel or unexpected calcification. Skull: Normal. Negative for fracture or focal lesion. Sinuses/Orbits: No acute finding. Other: None CT CERVICAL SPINE FINDINGS Alignment: Normal. Skull base and vertebrae: No acute fracture. No primary bone lesion or focal pathologic process. Soft tissues and spinal canal: No prevertebral fluid or swelling. No visible canal hematoma. Disc levels: C2-C3: Facet joint arthropathy with mild left neural foraminal stenosis. C3-C4: Severe left facet joint arthropathy with moderate neural foraminal stenosis. C4-C5: Severe left facet joint arthropathy with moderate left neural foraminal stenosis. C5-C6: Disc height loss and uncovertebral joint arthropathy. Mild bilateral neural foraminal stenosis. C6-C7: Disc height loss and uncovertebral joint arthropathy with mild-to-moderate bilateral neural foraminal stenosis. C7-T1:  Moderate  bilateral facet joint arthropathy. Upper chest: Negative. Other: None IMPRESSION: CT head: 1. No acute intracranial abnormality. Generalized cerebral atrophy and chronic microvascular ischemic changes of the white matter. CT cervical spine: 1. No  acute fracture or subluxation of the cervical spine. 2. Moderate-to-severe multilevel degenerative disc and joint disease of the cervical spine as described above. Electronically Signed   By: Keane Police D.O.   On: 10/27/2022 18:40   DG Pelvis Portable  Result Date: 10/07/2022 CLINICAL DATA:  Recent fall with pelvic pain, initial encounter EXAM: PORTABLE PELVIS 1 VIEWS COMPARISON:  03/28/2021 FINDINGS: Postsurgical changes are noted in the lower lumbar spine. Pelvic ring is intact. No acute fracture or dislocation is seen. No soft tissue abnormality is noted. Degenerative changes of the hip joints are noted bilaterally. IMPRESSION: Degenerative change without acute abnormality. Electronically Signed   By: Inez Catalina M.D.   On: 10/12/2022 17:46   DG Chest Port 1 View  Result Date: 10/23/2022 CLINICAL DATA:  Generalized weakness, lung cancer EXAM: PORTABLE CHEST 1 VIEW COMPARISON:  12/31/2021, 10/04/2022 FINDINGS: Single frontal view of the chest demonstrates a stable cardiac silhouette. Continued areas of consolidation within the left upper and left lower lung zones unchanged since recent PET scan. No effusion or pneumothorax. No acute bony abnormality. IMPRESSION: 1. Stable left lung consolidation, consistent with post treatment changes based on recent PET scan findings. 2. No new airspace disease. Electronically Signed   By: Randa Ngo M.D.   On: 10/26/2022 17:45    EKG: Independently reviewed. 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   -trauma work up neg -CTH/MRI negative for CVA -admit to tele for observation  -PT/OT   NSCLC s/p  SBRT -followed by oncology  -no acute issues currelty   GERD -ppi  CADs/p MI -no acute issues  - resume metoprolol,statin ,xarelto  PAF -continue amiodarone, metoprolol,xarelto  Hypothyroid  -resume synthroid   DMII -iss/fs  -hold oral hypoglycemics for now  -on ozempic ,jardiance metformin     HLD -continue statin   HTN Stable , resume home regimen as bp tolerates   OSA  -on CPAP   DVT prophylaxis: on full dose [place] Code Status: full Family Communication: none at bedside Disposition Plan: patient  expected to be admitted greater than 2 midnights  Consults called: n/a Admission status:progressive   Clance Boll MD [facility]   If 7PM-7AM, please contact night-coverage [email or web address] Password [facility]  10/19/2022, 12:36 AM

## 2022-10-19 NOTE — Consult Note (Signed)
NEUROLOGY CONSULTATION NOTE   Date of service: October 19, 2022 Patient Name: [name] MRN:  [id number] DOB:  [birth year] Reason for consult: "Fall, confusion worsening x 2 weeks" Requesting Provider: Ezekiel Slocumb, DO _ _ _   _ __   _ __ _ _  __ __   _ __   __ _  History of Present Illness  [name] is a 81 y.o. male with PMH significant for GERD, CAD, HLD, pAfibb, DM2, OSA, lung cancer who presents with frequent falls at home, confusion.  CT head negative, encephalopathy labs with B12 normal, folate pending, B1 levels pending, TSH normal, CBC and chemistry with no significant abnormalities.  He is on home meds that can cause confusion including lyrica, amitriptyline, percocet, trazodone.  Wife reports confusion over last 6 months, over the last week speech has been more slurry. Has been very sleepy for the last 6 months and been having falls.  On my evaluation, requires a lot of and constant stimulation to get him to answer questions.  Unfortunately unable to get much history from the patient.  Most of the history is obtained from the patient wife.   ROS   Unable to obtain detailed review of system secondary to somnolence and poor attention.  Past History   Past Medical History:  Diagnosis Date   AAA (abdominal aortic aneurysm) ([place])    a. s/p repair 2009.   Acid reflux    takes Prilosec and Protonix daily   Arthritis    BacK   CAD in native artery    a. a. prior inf MI 1993. b. PCI to LAD 05/1997. c. recurrent inferolateral MI complicated by V. fib arrest in 04/1998, prior PCI to OM1. d. 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   BRONCHIAL BIOPSY  07/09/2021   Procedure: BRONCHIAL BIOPSIES;  Surgeon: Collene Gobble, MD;  Location: [facility] ENDOSCOPY;  Service: Pulmonary;;   BRONCHIAL BIOPSY  12/31/2021   Procedure: BRONCHIAL BIOPSIES;  Surgeon: Collene Gobble, MD;  Location: [facility] ENDOSCOPY;  Service: Pulmonary;;   BRONCHIAL BRUSHINGS  07/09/2021   Procedure: BRONCHIAL BRUSHINGS;  Surgeon: Collene Gobble, MD;  Location: [facility] ENDOSCOPY;  Service: Pulmonary;;   BRONCHIAL BRUSHINGS  12/31/2021   Procedure: BRONCHIAL BRUSHINGS;  Surgeon: Collene Gobble, MD;  Location: [facility] ENDOSCOPY;  Service: Pulmonary;;   BRONCHIAL NEEDLE ASPIRATION BIOPSY  07/09/2021   Procedure: BRONCHIAL NEEDLE ASPIRATION BIOPSIES;  Surgeon: Collene Gobble, MD;  Location: [facility] ENDOSCOPY;  Service: Pulmonary;;   BRONCHIAL NEEDLE ASPIRATION BIOPSY  12/31/2021   Procedure: BRONCHIAL NEEDLE ASPIRATION BIOPSIES;  Surgeon: Collene Gobble, MD;  Location: [place];  Service: Pulmonary;;   BRONCHIAL WASHINGS  12/31/2021   Procedure: BRONCHIAL WASHINGS;  Surgeon: Collene Gobble, MD;  Location: [place];  Service: Pulmonary;;   CARDIAC CATHETERIZATION  2009/2012   CARDIOVERSION N/A 04/12/2015   Procedure: CARDIOVERSION;  Surgeon: Sueanne Margarita, MD;  Location: [place];  Service: Cardiovascular;  Laterality: N/A;   CARDIOVERSION N/A  12/26/2016   Procedure: CARDIOVERSION;  Surgeon: Sueanne Margarita, MD;  Location: [place];  Service: Cardiovascular;  Laterality: N/A;   [place]; [place]  07/09/2021   Procedure: FIDUCIAL MARKER PLACEMENT;  Surgeon: Collene Gobble, MD;  Location: [place];  Service: Pulmonary;;   FIDUCIAL MARKER PLACEMENT  12/31/2021   Procedure: FIDUCIAL MARKER PLACEMENT;  Surgeon: Collene Gobble, MD;  Location: [facility] ENDOSCOPY;  Service: Pulmonary;;   LAPAROSCOPIC CHOLECYSTECTOMY     LUMBAR LAMINECTOMY/DECOMPRESSION MICRODISCECTOMY Right 01/16/2016   Procedure: Right Lumbar five-Sacral one Laminectomy;  Surgeon: Kristeen Miss, MD;  Location: [facility] NEURO ORS;  Service: Neurosurgery;  Laterality: Right;  Right L5-S1 Laminectomy   VIDEO BRONCHOSCOPY WITH ENDOBRONCHIAL NAVIGATION N/A 07/09/2021   Procedure: ROBOTIC VIDEO BRONCHOSCOPY WITH ENDOBRONCHIAL NAVIGATION;  Surgeon: Collene Gobble, MD;  Location: [place] ENDOSCOPY;  Service: Pulmonary;  Laterality: N/A;   VIDEO BRONCHOSCOPY WITH RADIAL ENDOBRONCHIAL ULTRASOUND  07/09/2021   Procedure: VIDEO BRONCHOSCOPY WITH RADIAL ENDOBRONCHIAL ULTRASOUND;  Surgeon: Collene Gobble, MD;  Location: [facility] ENDOSCOPY;  Service: Pulmonary;;   VIDEO BRONCHOSCOPY WITH RADIAL ENDOBRONCHIAL ULTRASOUND  12/31/2021   Procedure: VIDEO BRONCHOSCOPY WITH RADIAL ENDOBRONCHIAL ULTRASOUND;  Surgeon: Collene Gobble, MD;  Location: [facility] ENDOSCOPY;  Service: Pulmonary;;   Family History  Problem Relation Age of Onset   Diabetes Mother    Coronary artery disease Mother    Diabetes Father    Arthritis Father    Rheum arthritis Father    Coronary artery disease Father    Social History   Socioeconomic History   Marital status: Married    Spouse name: Not on file   Number of children: Not on file   Years of education: Not on file   Highest education level: Not on file  Occupational History   Not on file  Tobacco Use    Smoking status: Former    Packs/day: 1.00    Years: 33.00    Total pack years: 33.00    Types: Cigarettes    Quit date: 01/01/1992    Years since quitting: 30.8   Smokeless tobacco: Never  Vaping Use   Vaping Use: Never used  Substance and Sexual Activity   Alcohol use: Not Currently    Comment: rare   Drug use: No   Sexual activity: Yes  Other Topics Concern   Not on file  Social History Narrative   Not on file   Social Determinants of Health   Financial Resource Strain: Not on file  Food Insecurity: No Food Insecurity (10/19/2022)   Hunger Vital Sign    Worried About  Running Out of Food in the Last Year: Never true    [place] in the Last Year: Never true  Transportation Needs: No Transportation Needs (10/19/2022)   PRAPARE - Hydrologist (Medical): No    Lack of Transportation (Non-Medical): No  Physical Activity: Not on file  Stress: Not on file  Social Connections: Not on file   Allergies  Allergen Reactions   Cymbalta [Duloxetine Hcl] Nausea And Vomiting and Other (See Comments)    PATIENT STATED THAT HE FELT LIKE HE "WAS GOING TO DIE" Rapid drop in blood pressure; sent him to the ER X2   Neurontin [Gabapentin] Other (See Comments)    "Makes me goofy, keeps me off balance, clouds my thinking.."   Atorvastatin Other (See Comments)   Ciprofloxacin Nausea And Vomiting   Keppra [Levetiracetam] Nausea And Vomiting   Pioglitazone Other (See Comments)   Simvastatin Other (See Comments)    Medications   Medications Prior to Admission  Medication Sig Dispense Refill Last Dose   albuterol (VENTOLIN HFA) 108 (90 Base) MCG/ACT inhaler Inhale 2 puffs into the lungs every 6 (six) hours as needed for wheezing or shortness of breath.   unknown   amiodarone (PACERONE) 200 MG tablet TAKE 1 TABLET BY MOUTH AT BEDTIME 90 tablet 1 10/17/2022   amitriptyline (ELAVIL) 25 MG tablet Take 25 mg by mouth at bedtime.   10/17/2022   atorvastatin  (LIPITOR) 10 MG tablet Take 10 mg by mouth in the morning.   10/11/2022   Cholecalciferol (VITAMIN D3) 50 MCG (2000 UT) TABS Take 2,000 Units by mouth daily.   10/26/2022   Coenzyme Q10 (COQ10) 200 MG CAPS Take 200 mg by mouth in the morning.   10/17/2022   Cyanocobalamin (B-12) 5000 MCG CAPS Take 5,000 mcg by mouth daily. 30 capsule 0 09/27/2022   famotidine (PEPCID) 20 MG tablet Take 20 mg by mouth daily as needed for heartburn or indigestion.   unknown   finasteride (PROPECIA) 1 MG tablet Take 1 mg by mouth daily.   10/07/2022   furosemide (LASIX) 40 MG tablet Take 1 tablet (40 mg total) by mouth daily as needed (fluid retention/edema). (Patient taking differently: Take 40 mg by mouth daily.)   10/03/2022   JARDIANCE 25 MG TABS tablet Take 25 mg by mouth every evening.   10/17/2022   lactose free nutrition (BOOST) LIQD Take 237 mLs by mouth in the morning.   10/17/2022   levothyroxine (SYNTHROID) 112 MCG tablet Take 112 mcg by mouth every morning.   10/08/2022   Lidocaine HCl 4 % CREA Apply 1 application topically 4 (four) times daily as needed (pain.).   unknown   metFORMIN (GLUCOPHAGE-XR) 500 MG 24 hr tablet Take 1,000 mg by mouth 2 (two) times daily with a meal.   09/28/2022   metoprolol tartrate (LOPRESSOR) 25 MG tablet Take 1 tablet (25 mg total) by mouth 2 (two) times daily. 180 tablet 3 10/05/2022 at 0930   Multiple Vitamin (MULTIVITAMIN WITH MINERALS) TABS tablet Take 1 tablet by mouth daily.   10/24/2022   oxyCODONE-acetaminophen (PERCOCET) 10-325 MG tablet Take 1 tablet by mouth 3 (three) times daily as needed for pain.   unknown   pregabalin (LYRICA) 300 MG capsule Take 300 mg by mouth 2 (two) times daily.   10/01/2022   rivaroxaban (XARELTO) 20 MG TABS tablet Take 1 tablet (20 mg total) by mouth every evening. OK to restart this medication on 01/01/2022 30 tablet 0  10/17/2022 at 2030   senna-docusate (SENOKOT-S) 8.6-50 MG tablet Take 1 tablet by mouth daily as needed (constipation.).    unknown   SOLIQUA 100-33 UNT-MCG/ML SOPN Inject 22 Units into the skin at bedtime.   10/17/2022   traZODone (DESYREL) 50 MG tablet Take 100 mg by mouth at bedtime.   10/17/2022   ACCU-CHEK SMARTVIEW test strip CHECK 1 TO 2 TIMES A DAY      glimepiride (AMARYL) 2 MG tablet Take 3 tablets (6 mg total) by mouth daily with breakfast. (Patient not taking: Reported on 09/27/2022) 90 tablet 0 Completed Course   mirtazapine (REMERON) 15 MG tablet Take 1 tablet (15 mg total) by mouth at bedtime. (Patient not taking: Reported on 10/17/2022) 30 tablet 0 Completed Course   ondansetron (ZOFRAN ODT) 4 MG disintegrating tablet Take 1 tablet (4 mg total) by mouth every 8 (eight) hours as needed for nausea or vomiting. (Patient not taking: Reported on 05/08/2022) 20 tablet 0 Not Taking   RELION PEN NEEDLES 32G X 4 MM MISC         Vitals   Vitals:   10/06/2022 2249 10/19/22 0013 10/19/22 0600 10/19/22 1006  BP:  (!) 141/76 119/67 139/76  Pulse:  92 82 84  Resp:  18 18 18   Temp: 98.3 F (36.8 C) 98 F (36.7 C) 98 F (36.7 C) 97.9 F (36.6 C)  TempSrc:  Oral Oral Axillary  SpO2:  94% 93% 96%  Weight:      Height:  6' (1.829 m)       Body mass index is 27.12 kg/m.  Physical Exam   General: Laying comfortably in bed; in no acute distress.  HENT: Normal oropharynx and mucosa. Normal external appearance of ears and nose.  Neck: Supple, no pain or tenderness  CV: No JVD. No peripheral edema.  Pulmonary: Symmetric Chest rise. Normal respiratory effort.  Abdomen: Soft to touch, non-tender.  Ext: No cyanosis, edema, or deformity  Skin: No rash. Normal palpation of skin.   Musculoskeletal: Normal digits and nails by inspection. No clubbing.   Neurologic Examination  Mental status/Cognition: drowsy, partially opens eyes to vigorous tactile stimulation, oriented to self, falls back asleep immediately. Speech/language: More so than slurring, I think his speech is hypophonic, soft and whispery and almost  mumbling. Cranial nerves:   CN II Pupils equal and reactive to light, makes very brief eye contact on left and right.   CN III,IV,VI EOMI intact to doll's eye.   CN V Corneals intact bilaterally closes his eyes tight shut to saline in his eyes.   CN VII Symmetric facial grimace.   CN VIII Makes eye contact to loud voice.   CN IX & X normal palatal elevation, no uvular deviation   CN XI  Head midline   CN XII midline tongue protrusion   Motor:  Muscle bulk: normal, tone normal, some asterixis noted on exam BL. Mvmt Root Nerve  Muscle Right Left Comments  SA C5/6 Ax Deltoid 4 4   EF C5/6 Mc Biceps 5 5   EE C6/7/8 Rad Triceps 5 5   WF C6/7 Med FCR     WE C7/8 PIN ECU     F Ab C8/T1 U ADM/FDI 5 5   HF L1/2/3 Fem Illopsoas 4+ 4+   KE L2/3/4 Fem Quad 5 5   DF L4/5 D Peron Tib Ant 3 3   PF S1/2 Tibial Grc/Sol 3 3    Reflexes:  Right Left Comments  Pectoralis  Biceps (C5/6) 2 2   Brachioradialis (C5/6) 2 2    Triceps (C6/7) 2 2    Patellar (L3/4) 1 1    Achilles (S1) 0 0    [place]      Plantar     Jaw jerk    Sensation:  Light touch Decreased in bilateral feet, likely due to known peripheral neuropathy.   Pin prick    Temperature    Vibration   Proprioception    Coordination/Complex Motor:  - Finger to Nose intact BL - Heel to shin unable to get him to do - Rapid alternating movement are slowed - Gait: deferred/  Labs   CBC:  Recent Labs  Lab 10/14/2022 1430 10/19/22 0626  WBC 7.0 5.6  HGB 12.7* 11.9*  HCT 38.0* 35.2*  MCV 96.9 95.7  PLT 166 161    Basic Metabolic Panel:  Lab Results  Component Value Date   NA 140 10/19/2022   K 3.7 10/19/2022   CO2 25 10/19/2022   GLUCOSE 146 (H) 10/19/2022   BUN 29 (H) 10/19/2022   CREATININE 0.89 10/19/2022   CALCIUM 9.4 10/19/2022   GFRNONAA >60 10/19/2022   GFRAA >60 02/17/2020   Lipid Panel:  Lab Results  Component Value Date   LDLCALC  11/26/2010    92        Total Cholesterol/HDL:CHD Risk Coronary  Heart Disease Risk Table                     Men   Women  1/2 Average Risk   3.4   3.3  Average Risk       5.0   4.4  2 X Average Risk   9.6   7.1  3 X Average Risk  23.4   11.0        Use the calculated Patient Ratio above and the CHD Risk Table to determine the patient's CHD Risk.        ATP III CLASSIFICATION (LDL):  <100     mg/dL   Optimal  100-129  mg/dL   Near or Above                    Optimal  130-159  mg/dL   Borderline  160-189  mg/dL   High  >190     mg/dL   Very High   HgbA1c:  Lab Results  Component Value Date   HGBA1C 7.8 (H) 03/29/2021   Urine Drug Screen:     Component Value Date/Time   LABOPIA NONE DETECTED 12/15/2020 1501   COCAINSCRNUR NONE DETECTED 12/15/2020 1501   LABBENZ POSITIVE (A) 12/15/2020 1501   AMPHETMU NONE DETECTED 12/15/2020 1501   THCU NONE DETECTED 12/15/2020 1501   LABBARB NONE DETECTED 12/15/2020 1501    Alcohol Level     Component Value Date/Time   ETH <10 03/28/2021 2037    CT Head without contrast(Personally reviewed): CTH was negative for a large hypodensity concerning for a large territory infarct or hyperdensity concerning for an ICH  MRI Brain(Personally reviewed): No acute abnormality  rEEG:  pending  Impression   [name] is a 81 y.o. male with PMH significant for GERD, CAD, HLD, pAfibb, DM2, OSA, lung cancer who presents with frequent falls at home, confusion. His neurologic examination is notable for significant somnolent which does make it very hard to examine him.  Overall, my suspicion for primary CNS disorder is low. I suspect that a majority of his somnolence  and falls are likely due to drowsiness from medications. He is on several sedating medications including lyrica, amitriptyline, percocet, trazodone.  He also has some asterixis on exam which could be caused by both lyrica and opiods.  Recommendations  - I personally would recommend holding all sedating medications for now and introduce as he  reports pain and awakens more. I agree with him being on very high dose of lyrica which along with opiods is also contributing to the noted concern for asterixis. 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 - I dont think that holding his Lyrica would put him at a much higher risk for seizures and dont think we need to treat this with diazepam. - overall, recommend judicious use of sedating medications and introduce as he becomes more awake. - can also use lidocaine gel for his BL lower extremity neuropathy pain as a supplement to his lyrica. - will get routine EEG for completion of workup, this will happen on Tuesday and that should be fine. If he is significantly improved prior to this, he can be discharged with outpatient EEG. - encephalopathy labs ordered but he takes a multivitamin at home. So far TSH is normal, B12 normal, Folate normal, Thiamine levels pending. - no noted clear infectious etiology so far noted. ______________________________________________________________________   Thank you for the opportunity to take part in the care of this patient. If you have any further questions, please contact the neurology consultation attending.  Signed,  [place] Pager Number [phone] _ _ _   _ __   _ __ _ _  __ __   _ __   __ _

## 2022-10-19 NOTE — Evaluation (Signed)
Clinical/Bedside Swallow Evaluation Patient Details  Name: [name] MRN: [id number] Date of Birth: 1941-08-20  Today's Date: 10/19/2022 Time: SLP Start Time (ACUTE ONLY): 1300 SLP Stop Time (ACUTE ONLY): 1340 SLP Time Calculation (min) (ACUTE ONLY): 40 min  Past Medical History:  Past Medical History:  Diagnosis Date   AAA (abdominal aortic aneurysm) ([place])    a. s/p repair 2009.   Acid reflux    takes Prilosec and Protonix daily   Arthritis    BacK   CAD in native artery    a. a. prior inf MI 1993. b. PCI to LAD 05/1997. c. recurrent inferolateral MI complicated by V. fib arrest in 04/1998, prior PCI to OM1. d. 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   Cancer ([place])    basal and squamous cell   Chronic combined systolic and diastolic CHF (congestive heart failure) (HCC)    Complication of anesthesia    had an ileus after a couple of surgeries   COVID    Depression    over 30 years ago   Dry skin    Encephalitis    Foot drop, bilateral    Hypercholesteremia    Hypertension    Ileus ([place])    After AAA   Incisional hernia    "small, from AAA"   Ischemic cardiomyopathy    MI (myocardial infarction) ([place])    Neuromuscular disorder ([place])    neuropathy in feet   Neuropathy    in feet & legs   OSA on CPAP    uses CPAP--sleep study done at least 65yrs ago   PAF (paroxysmal atrial fibrillation) (HCC)    Pain    back pain chronic- seen at pain clinic   Plantar fasciitis    bilatetral   Pneumonia    Thrombocytopenia ([place])    Type II diabetes mellitus ([place])    Past Surgical History:  Past Surgical History:  Procedure Laterality Date   ABDOMINAL AORTIC ANEURYSM REPAIR     BACK SURGERY  2012-2013 X 3   Miminal Invasive x 3in [place].   BRONCHIAL BIOPSY  07/09/2021   Procedure: BRONCHIAL BIOPSIES;  Surgeon: Collene Gobble, MD;  Location: [facility] ENDOSCOPY;  Service: Pulmonary;;   BRONCHIAL BIOPSY  12/31/2021   Procedure: BRONCHIAL BIOPSIES;  Surgeon: Collene Gobble, MD;  Location: [facility]  ENDOSCOPY;  Service: Pulmonary;;   BRONCHIAL BRUSHINGS  07/09/2021   Procedure: BRONCHIAL BRUSHINGS;  Surgeon: Collene Gobble, MD;  Location: [facility] ENDOSCOPY;  Service: Pulmonary;;   BRONCHIAL BRUSHINGS  12/31/2021   Procedure: BRONCHIAL BRUSHINGS;  Surgeon: Collene Gobble, MD;  Location: [facility] ENDOSCOPY;  Service: Pulmonary;;   BRONCHIAL NEEDLE ASPIRATION BIOPSY  07/09/2021   Procedure: BRONCHIAL NEEDLE ASPIRATION BIOPSIES;  Surgeon: Collene Gobble, MD;  Location: [facility] ENDOSCOPY;  Service: Pulmonary;;   BRONCHIAL NEEDLE ASPIRATION BIOPSY  12/31/2021   Procedure: BRONCHIAL NEEDLE ASPIRATION BIOPSIES;  Surgeon: Collene Gobble, MD;  Location: [facility] ENDOSCOPY;  Service: Pulmonary;;   BRONCHIAL WASHINGS  12/31/2021   Procedure: BRONCHIAL WASHINGS;  Surgeon: Collene Gobble, MD;  Location: [place];  Service: Pulmonary;;   CARDIAC CATHETERIZATION  2009/2012   CARDIOVERSION N/A 04/12/2015   Procedure: CARDIOVERSION;  Surgeon: Sueanne Margarita, MD;  Location: [place];  Service: Cardiovascular;  Laterality: N/A;   CARDIOVERSION N/A 12/26/2016   Procedure: CARDIOVERSION;  Surgeon: Sueanne Margarita, MD;  Location: [place];  Service: Cardiovascular;  Laterality: N/A;   [place]  1992; 1997   FIDUCIAL MARKER PLACEMENT  07/09/2021   Procedure: FIDUCIAL MARKER PLACEMENT;  Surgeon: Collene Gobble, MD;  Location: [facility] ENDOSCOPY;  Service: Pulmonary;;   FIDUCIAL MARKER PLACEMENT  12/31/2021   Procedure: FIDUCIAL MARKER PLACEMENT;  Surgeon: Collene Gobble, MD;  Location: [facility] ENDOSCOPY;  Service: Pulmonary;;   LAPAROSCOPIC CHOLECYSTECTOMY     LUMBAR LAMINECTOMY/DECOMPRESSION MICRODISCECTOMY Right 01/16/2016   Procedure: Right Lumbar five-Sacral one Laminectomy;  Surgeon: Kristeen Miss, MD;  Location: [place] NEURO ORS;  Service: Neurosurgery;  Laterality: Right;  Right L5-S1 Laminectomy   VIDEO BRONCHOSCOPY WITH ENDOBRONCHIAL NAVIGATION N/A 07/09/2021   Procedure: ROBOTIC VIDEO  BRONCHOSCOPY WITH ENDOBRONCHIAL NAVIGATION;  Surgeon: Collene Gobble, MD;  Location: [place] ENDOSCOPY;  Service: Pulmonary;  Laterality: N/A;   VIDEO BRONCHOSCOPY WITH RADIAL ENDOBRONCHIAL ULTRASOUND  07/09/2021   Procedure: VIDEO BRONCHOSCOPY WITH RADIAL ENDOBRONCHIAL ULTRASOUND;  Surgeon: Collene Gobble, MD;  [facility]: [facility] ENDOSCOPY;  Service: Pulmonary;;   VIDEO BRONCHOSCOPY WITH RADIAL ENDOBRONCHIAL ULTRASOUND  12/31/2021   Procedure: VIDEO BRONCHOSCOPY WITH RADIAL ENDOBRONCHIAL ULTRASOUND;  Surgeon: Collene Gobble, MD;  Location: [facility] ENDOSCOPY;  Service: Pulmonary;;   HPI:  [name] is a 81 y.o. 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 Of note on last fall he hit his head and was unable to get up and was immobilized  on floor for overnight , an unknown period of time. Family in am called EMS. He was noted in field to have slurred speech. Per wife he had had progressive decline over the last 2 weeks at home. She notes at baseline he has clear speech but is at baseline confused.    Assessment / Plan / Recommendation  Clinical Impression  Pt minimally arousable. With constant stimulation pt can take sips from a straw, but timing of airway protection seems impaired. WHen given puree pt falls asleep with partial bolus in his mouth, snores and coughs. Pointed out risk of aspiration due to lethargy with wife. Reported to Dr Hilma Favors who will make him NPO. MD can modify the diet as needed based on arousal. At baseline he does not have dysphagia. SLP will f/u when possible. SLP Visit Diagnosis: Dysphagia, unspecified (R13.10)    Aspiration Risk  Moderate aspiration risk    Diet Recommendation NPO   Medication Administration: Crushed with puree    Other   Recommendations      Recommendations for follow up therapy are one component of a multi-disciplinary discharge planning process, led by the attending physician.  Recommendations may be updated based on patient status, additional functional criteria and insurance authorization.  Follow up Recommendations        Assistance Recommended at Discharge    Functional Status Assessment    Frequency and Duration            Prognosis Prognosis for Safe Diet Advancement: Guarded      Swallow Study   General HPI: [name] is a 81 y.o. 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  Of note on last fall he hit his head and was unable to get up and was immobilized  on floor for overnight , an unknown period of time. Family in am called EMS. He was noted in field to have slurred speech. Per wife he had had progressive decline over the last 2 weeks at home. She notes at baseline he has clear speech but is at baseline confused. Type of Study: Bedside Swallow Evaluation Previous Swallow Assessment: none Diet Prior to this Study: Regular;Thin liquids Temperature Spikes Noted: No Respiratory Status: Room air History of Recent Intubation: No Behavior/Cognition: Lethargic/Drowsy Oral Cavity Assessment: Dry Oral Care Completed by SLP: No Oral Cavity - Dentition: Adequate natural dentition Vision: Functional for self-feeding Self-Feeding Abilities: Needs assist Patient Positioning: Upright in bed Baseline Vocal Quality: Low vocal intensity Volitional Cough: Weak    Oral/Motor/Sensory Function Overall Oral Motor/Sensory Function: Generalized oral weakness   Ice Chips     Thin Liquid Thin Liquid: Impaired Presentation: Cup;Straw Oral Phase Impairments:  Reduced labial seal;Poor awareness of bolus Oral Phase Functional Implications: Right anterior spillage;Left anterior spillage Pharyngeal  Phase Impairments: Cough - Immediate;Cough - Delayed    Nectar Thick Nectar Thick Liquid: Not tested   Honey Thick Honey Thick Liquid: Not tested   Puree Puree: Impaired Presentation: Spoon Oral Phase Impairments: Poor awareness of bolus Oral Phase Functional Implications: Prolonged oral transit;Oral residue   Solid     Solid: Not tested      Solly Derasmo, Katherene Ponto 10/19/2022,1:42 PM

## 2022-10-19 NOTE — Progress Notes (Signed)
Brief rounding note, same day as admission  HPI: [name] is a 81 y.o. 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 Of note on last fall he hit his head and was unable to get up and was immobilized  on floor for overnight , an unknown period of time. Family in am called EMS. He was noted in field to have slurred speech. Per wife he had had progressive decline over the last 2 weeks at home. She notes at baseline he has clear speech but is at baseline confused.   Interval history: Pt seen with wife at bedside. Pt sleeping, lethargic but responsive.  Does not wake up enough to meaningfully interact.  Wife denies hx of dementia or cognitive impairment. States his most important medicine is Lyrica for his severe neuropathy.  Exam:  Limited by patient's mental status General exam: lethargic, no acute distress, intermittently gets restless very briefly HEENT: eyes closed, dry mucus membranes  Respiratory system: CTAB, no wheezes, rales or rhonchi, normal respiratory effort. Cardiovascular system: normal S1/S2, RRR, trace pedal edema.   Gastrointestinal system: soft, NT, ND, no HSM felt, +bowel sounds. Central nervous system: exam limited, does not follow commands, lethargic. Skin: very dry, intact, normal temperature Psychiatry: unable to assess due to lethargy    A&P: as per H&P by Dr. Marcello Moores, with any changes or additions as below:  --Neurology formally consulted --MRI negative, acute stroke ruled out --Etiology of AMS remains unclear --Note EEG is pending --Add on B1, B12 levels --Follow pending VBG --SLP evaluation of swallow - pending --Palliative care consult (pending outpatient initial consult ordered by PCP) --PT/OT evaluations once  able to participate --Per RN, acute urinary retention, required in/out cath overnight. Continue bladder scans and in/out cath.  Place Foley if >3 in/outs required    No charge

## 2022-10-19 NOTE — Progress Notes (Signed)
Patient was admitted to room 1417 at approximately 0200, he is confused and responds to voice and tactile stimuli. Medicated for observed agitation and anticipated pain per prn orders. Spouse at bedside and able to answer admission questions. Attending Dr. Marcello Moores in to round on patient this a.m. Urine sent to lab via in/out cath post bladder scan.This patient is resting in bed at time of entry. Spouse remains at bedside.

## 2022-10-19 NOTE — H&P (Incomplete)
History and Physical    Patient: [name] KGY:[id number] DOB: [birth year] DOA: 10/04/2022 DOS: the patient was seen and examined on 10/19/2022 PCP: Kathalene Frames, MD  Patient coming from: {Point_of_Origin:26777}  Chief Complaint:  Chief Complaint  Patient presents with   Weakness   HPI: [name] is a 81 y.o. male with medical history significant of ***  Review of Systems: {ROS_Text:26778} Past Medical History:  Diagnosis Date   AAA (abdominal aortic aneurysm) ([place])    a. s/p repair 2009.   Acid reflux    takes Prilosec and Protonix daily   Arthritis    BacK   CAD in native artery    a. a. prior inf MI 1993. b. PCI to LAD 05/1997. c. recurrent inferolateral MI complicated by V. fib arrest in 04/1998, prior PCI to OM1. d. 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   Cancer ([place])    basal and squamous cell   Chronic combined systolic and diastolic CHF (congestive heart failure) (HCC)    Complication of anesthesia    had an ileus after a couple of surgeries   COVID    Depression    over 30 years ago   Dry skin    Encephalitis    Foot drop, bilateral    Hypercholesteremia    Hypertension    Ileus ([place])    After AAA   Incisional hernia    "small, from AAA"   Ischemic cardiomyopathy    MI (myocardial infarction) ([place])    Neuromuscular disorder ([place])    neuropathy in feet   Neuropathy    in feet & legs   OSA on CPAP    uses CPAP--sleep study done at least 62yrs ago   PAF (paroxysmal atrial fibrillation) (HCC)    Pain    back pain chronic- seen at pain clinic   Plantar fasciitis    bilatetral   Pneumonia    Thrombocytopenia ([place])    Type II diabetes mellitus ([place])    Past Surgical History:  Procedure Laterality Date   ABDOMINAL AORTIC ANEURYSM REPAIR     BACK SURGERY  2012-2013 X 3   Miminal Invasive x 3in [place].   BRONCHIAL BIOPSY  07/09/2021   Procedure: BRONCHIAL BIOPSIES;  Surgeon: Collene Gobble, MD;  Location: [facility] ENDOSCOPY;  Service: Pulmonary;;    BRONCHIAL BIOPSY  12/31/2021   Procedure: BRONCHIAL BIOPSIES;  Surgeon: Collene Gobble, MD;  Location: [facility] ENDOSCOPY;  Service: Pulmonary;;   BRONCHIAL BRUSHINGS  07/09/2021   Procedure: BRONCHIAL BRUSHINGS;  Surgeon: Collene Gobble, MD;  Location: [facility] ENDOSCOPY;  Service: Pulmonary;;   BRONCHIAL BRUSHINGS  12/31/2021   Procedure: BRONCHIAL BRUSHINGS;  Surgeon: Collene Gobble, MD;  Location: [facility] ENDOSCOPY;  Service: Pulmonary;;   BRONCHIAL NEEDLE ASPIRATION BIOPSY  07/09/2021   Procedure: BRONCHIAL NEEDLE ASPIRATION BIOPSIES;  Surgeon: Collene Gobble, MD;  Location: [facility] ENDOSCOPY;  Service: Pulmonary;;   BRONCHIAL NEEDLE ASPIRATION BIOPSY  12/31/2021   Procedure: BRONCHIAL NEEDLE ASPIRATION BIOPSIES;  Surgeon: Collene Gobble, MD;  Location: [facility] ENDOSCOPY;  Service: Pulmonary;;   BRONCHIAL WASHINGS  12/31/2021   Procedure: BRONCHIAL WASHINGS;  Surgeon: Collene Gobble, MD;  Location: [place];  Service: Pulmonary;;   CARDIAC CATHETERIZATION  2009/2012   CARDIOVERSION N/A 04/12/2015   Procedure: CARDIOVERSION;  Surgeon: Sueanne Margarita, MD;  Location: [place];  Service: Cardiovascular;  Laterality: N/A;   CARDIOVERSION N/A 12/26/2016   Procedure: CARDIOVERSION;  Surgeon: Sueanne Margarita, MD;  Location: [facility]  ENDOSCOPY;  Service: Cardiovascular;  Laterality: N/A;   COLONOSCOPY     CORONARY ANGIOPLASTY WITH STENT PLACEMENT  1992; 1997   FIDUCIAL MARKER PLACEMENT  07/09/2021   Procedure: FIDUCIAL MARKER PLACEMENT;  Surgeon: Collene Gobble, MD;  Location: [place];  Service: Pulmonary;;   FIDUCIAL MARKER PLACEMENT  12/31/2021   Procedure: FIDUCIAL MARKER PLACEMENT;  Surgeon: Collene Gobble, MD;  Location: [facility] ENDOSCOPY;  Service: Pulmonary;;   LAPAROSCOPIC CHOLECYSTECTOMY     LUMBAR LAMINECTOMY/DECOMPRESSION MICRODISCECTOMY Right 01/16/2016   Procedure: Right Lumbar five-Sacral one Laminectomy;  Surgeon: Kristeen Miss, MD;  Location: [facility] NEURO ORS;  Service: Neurosurgery;  Laterality: Right;  Right L5-S1  Laminectomy   VIDEO BRONCHOSCOPY WITH ENDOBRONCHIAL NAVIGATION N/A 07/09/2021   Procedure: ROBOTIC VIDEO BRONCHOSCOPY WITH ENDOBRONCHIAL NAVIGATION;  Surgeon: Collene Gobble, MD;  Location: [place] ENDOSCOPY;  Service: Pulmonary;  Laterality: N/A;   VIDEO BRONCHOSCOPY WITH RADIAL ENDOBRONCHIAL ULTRASOUND  07/09/2021   Procedure: VIDEO BRONCHOSCOPY WITH RADIAL ENDOBRONCHIAL ULTRASOUND;  Surgeon: Collene Gobble, MD;  Location: [facility] ENDOSCOPY;  Service: Pulmonary;;   VIDEO BRONCHOSCOPY WITH RADIAL ENDOBRONCHIAL ULTRASOUND  12/31/2021   Procedure: VIDEO BRONCHOSCOPY WITH RADIAL ENDOBRONCHIAL ULTRASOUND;  Surgeon: Collene Gobble, MD;  Location: [facility] ENDOSCOPY;  Service: Pulmonary;;   Social History:  reports that he quit smoking about 30 years ago. His smoking use included cigarettes. He has a 33.00 pack-year smoking history. He has never used smokeless tobacco. He reports that he does not currently use alcohol. He reports that he does not use drugs.  Allergies  Allergen Reactions   Cymbalta [Duloxetine Hcl] Nausea And Vomiting and Other (See Comments)    PATIENT STATED THAT HE FELT LIKE HE "WAS GOING TO DIE" Rapid drop in blood pressure; sent him to the ER X2   Neurontin [Gabapentin] Other (See Comments)    "Makes me goofy, keeps me off balance, clouds my thinking.."   Atorvastatin Other (See Comments)   Ciprofloxacin Nausea And Vomiting   Keppra [Levetiracetam] Nausea And Vomiting   Pioglitazone Other (See Comments)   Simvastatin Other (See Comments)    Family History  Problem Relation Age of Onset   Diabetes Mother    Coronary artery disease Mother    Diabetes Father    Arthritis Father    Rheum arthritis Father    Coronary artery disease Father     Prior to Admission medications   Medication Sig Start Date End Date Taking? Authorizing Provider  albuterol (VENTOLIN HFA) 108 (90 Base) MCG/ACT inhaler Inhale 2 puffs into the lungs every 6 (six) hours as needed for wheezing or shortness of  breath. 02/17/21  Yes [provider]  amiodarone (PACERONE) 200 MG tablet TAKE 1 TABLET BY MOUTH AT BEDTIME 06/18/22  Yes Belva Crome, MD  amitriptyline (ELAVIL) 25 MG tablet Take 25 mg by mouth at bedtime. 05/17/22  Yes [provider]  atorvastatin (LIPITOR) 10 MG tablet Take 10 mg by mouth in the morning. 06/13/21  Yes [provider]  Cholecalciferol (VITAMIN D3) 50 MCG (2000 UT) TABS Take 2,000 Units by mouth daily.   Yes [provider]  Coenzyme Q10 (COQ10) 200 MG CAPS Take 200 mg by mouth in the morning.   Yes [provider]  Cyanocobalamin (B-12) 5000 MCG CAPS Take 5,000 mcg by mouth daily. 04/27/21  Yes Angiulli, Lavon Paganini, PA-C  famotidine (PEPCID) 20 MG tablet Take 20 mg by mouth daily as needed for heartburn or indigestion.   Yes [provider]  finasteride (  PROPECIA) 1 MG tablet Take 1 mg by mouth daily.   Yes [provider]  furosemide (LASIX) 40 MG tablet Take 1 tablet (40 mg total) by mouth daily as needed (fluid retention/edema). Patient taking differently: Take 40 mg by mouth daily. 07/09/21  Yes Collene Gobble, MD  JARDIANCE 25 MG TABS tablet Take 25 mg by mouth every evening. 05/01/21  Yes [provider]  lactose free nutrition (BOOST) LIQD Take 237 mLs by mouth in the morning.   Yes [provider]  levothyroxine (SYNTHROID) 112 MCG tablet Take 112 mcg by mouth every morning. 04/24/22  Yes [provider]  Lidocaine HCl 4 % CREA Apply 1 application topically 4 (four) times daily as needed (pain.).   Yes [provider]  metFORMIN (GLUCOPHAGE-XR) 500 MG 24 hr tablet Take 1,000 mg by mouth 2 (two) times daily with a meal. 05/10/21  Yes [provider]  metoprolol tartrate (LOPRESSOR) 25 MG tablet Take 1 tablet (25 mg total) by mouth 2 (two) times daily. 12/31/21  Yes Belva Crome, MD  Multiple Vitamin (MULTIVITAMIN WITH MINERALS) TABS tablet Take 1 tablet by mouth daily.   Yes  [provider]  oxyCODONE-acetaminophen (PERCOCET) 10-325 MG tablet Take 1 tablet by mouth 3 (three) times daily as needed for pain. 10/16/22  Yes [provider]  pregabalin (LYRICA) 300 MG capsule Take 300 mg by mouth 2 (two) times daily. 06/08/21  Yes [provider]  rivaroxaban (XARELTO) 20 MG TABS tablet Take 1 tablet (20 mg total) by mouth every evening. OK to restart this medication on 01/01/2022 12/31/21  Yes Collene Gobble, MD  senna-docusate (SENOKOT-S) 8.6-50 MG tablet Take 1 tablet by mouth daily as needed (constipation.). 07/09/21  Yes Collene Gobble, MD  SOLIQUA 100-33 UNT-MCG/ML SOPN Inject 22 Units into the skin at bedtime. 10/02/22  Yes [provider]  traZODone (DESYREL) 50 MG tablet Take 100 mg by mouth at bedtime. 04/24/22  Yes [provider]  ACCU-CHEK SMARTVIEW test strip CHECK 1 TO 2 TIMES A DAY 03/12/21   [provider]  glimepiride (AMARYL) 2 MG tablet Take 3 tablets (6 mg total) by mouth daily with breakfast. Patient not taking: Reported on 09/29/2022 04/27/21   Angiulli, Lavon Paganini, PA-C  mirtazapine (REMERON) 15 MG tablet Take 1 tablet (15 mg total) by mouth at bedtime. Patient not taking: Reported on 10/17/2022 04/27/21   Cathlyn Parsons, PA-C  ondansetron (ZOFRAN ODT) 4 MG disintegrating tablet Take 1 tablet (4 mg total) by mouth every 8 (eight) hours as needed for nausea or vomiting. Patient not taking: Reported on 05/08/2022 07/13/21   Horton, Alvin Critchley, DO  RELION PEN NEEDLES 32G X 4 MM MISC  05/06/22   [provider]    Physical Exam: Vitals:   09/28/2022 2030 10/23/2022 2230 10/14/2022 2249 10/19/22 0013  BP: 120/69 124/85  (!) 141/76  Pulse: 75 86  92  Resp: (!) 22 16  18   Temp:   98.3 F (36.8 C) 98 F (36.7 C)  TempSrc:    Oral  SpO2: 97% 99%  94%  Weight:      Height:    6' (1.829 m)   *** Data Reviewed: {Tip this will not be part of the note when signed- Document your independent interpretation  of telemetry tracing, EKG, lab, Radiology test or any other diagnostic tests. Add any new diagnostic test ordered today. (Optional):26781} {Results:26384}  Assessment and Plan: No notes have been filed under  this hospital service. Service: Hospitalist     Advance Care Planning:   Code Status: Full Code ***  Consults: ***  Family Communication: ***  Severity of Illness: {Observation/Inpatient:21159}  Author: Clance Boll, MD 10/19/2022 6:00 AM  For on call review [email or web address].

## 2022-10-19 NOTE — Consult Note (Signed)
Palliative Care Consultation Note  Mr. Homewood is a 81 year old gentleman with non-small cell lung cancer, diabetes, obstructive sleep apnea on CPAP, coronary artery disease, CHF and severe pain from diabetic peripheral neuropathy and prior injuries to his lower back.  He was admitted with significant mental status changes and frequent falling at home and slurred speech.  His wife Fraser Din is his main caregiver and they have 4 children who live out of state.  They have in-home private duty caregivers.   In terms of Mr. Doyon history he recently saw oncology for his lung cancer, for concerns that he had progression of his cancer and in his femur and also in his lung however his PET scan did not indicate increased metabolic activity in these areas.  He has also had a CT scan of his head and other neuroimaging that has not indicated that he has metastatic disease to attribute his falls.  He has known severe peripheral neuropathy that is affected his mobility.  His wife his primary care physician manages this and had recently made adjustments to his Lyrica dosing.  I also reviewed his PDMP report and he receives a prescription for 90 oxycodone tablets every month from his primary care physician, well as the prescription for Lyrica 300 mg twice a day.  At the present time the etiology of his mental status is largely unknown as his infectious and metastatic workup has been negative.   Assessment and Recommendations:  DNR, I discussed code status and advance directives. He has a living will at home, requested copy for his chart. A consult for [facility] palliative community-based palliative care was placed by Dr. Earlie Server, but they have not engaged yet with this patient after multiple attempts since September. He has not been seen in our palliative care clinic in the cancer center, I highly recommend that he see our [facility]  provider for pain and symptom management after this hospitalization. I suspect the etiology of  his altered mental status is actually related to medication side effects with an extremely high dose of pregabalin -suspect lyrica toxicity. He was taking 600mg  daily -double recommended daily dose- this change was made about 3 months ago and correlates with his hx of falls. He could be having sub clinical seizure from this- will schedule low dose diazepam for next 24 hours. He is on opioid at home -Oxy 10mg  TID-will give him IV opioid to prevent withdrawal inpatient and NPO.  Hydromorphone 0.5 mg 4 hours scheduled will he is able to take p.o. Once we determine the etiology of his mental status and determine if there is reversibility I will be able to discuss a plan of care with them-introduced the concept of hospice and he may be a good candidate for this depending on how he does but her goal is to keep him at home.  Cancer remains under observation status, no active treatment.   Lane Hacker, DO Palliative Medicine 75 min

## 2022-10-20 ENCOUNTER — Encounter (HOSPITAL_COMMUNITY): Payer: Self-pay | Admitting: Internal Medicine

## 2022-10-20 DIAGNOSIS — G9341 Metabolic encephalopathy: Secondary | ICD-10-CM | POA: Diagnosis not present

## 2022-10-20 LAB — BASIC METABOLIC PANEL
Anion gap: 10 (ref 5–15)
BUN: 29 mg/dL — ABNORMAL HIGH (ref 8–23)
CO2: 23 mmol/L (ref 22–32)
Calcium: 9.3 mg/dL (ref 8.9–10.3)
Chloride: 104 mmol/L (ref 98–111)
Creatinine, Ser: 0.99 mg/dL (ref 0.61–1.24)
GFR, Estimated: >60 mL/min (ref >60)
Glucose, Bld: 168 mg/dL — ABNORMAL HIGH (ref 70–99)
Potassium: 3.8 mmol/L (ref 3.5–5.1)
Sodium: 137 mmol/L (ref 135–145)

## 2022-10-20 LAB — GLUCOSE, CAPILLARY
Glucose-Capillary: 207 mg/dL — ABNORMAL HIGH (ref 70–99)
Glucose-Capillary: 175 mg/dL — ABNORMAL HIGH (ref 70–99)
Glucose-Capillary: 161 mg/dL — ABNORMAL HIGH (ref 70–99)

## 2022-10-20 LAB — MAGNESIUM: Magnesium: 2.1 mg/dL (ref 1.7–2.4)

## 2022-10-20 LAB — URINE CULTURE: Culture: NO GROWTH

## 2022-10-20 MED ORDER — CHLORHEXIDINE GLUCONATE CLOTH 2 % EX PADS
6.0000 | MEDICATED_PAD | Freq: Every day | CUTANEOUS | Status: DC
  Administered 2022-10-20 – 2022-10-22 (×3): 6 via TOPICAL

## 2022-10-20 MED ORDER — LORAZEPAM 2 MG/ML IJ SOLN
1.0000 mg | INTRAMUSCULAR | Status: DC | PRN
  Administered 2022-10-20: 3 mg via INTRAVENOUS
  Administered 2022-10-20 (×2): 2 mg via INTRAVENOUS
  Administered 2022-10-20: 3 mg via INTRAVENOUS
  Administered 2022-10-20 – 2022-10-21 (×9): 2 mg via INTRAVENOUS
  Administered 2022-10-21: 4 mg via INTRAVENOUS
  Filled 2022-10-20 (×2): qty 1
  Filled 2022-10-20 (×2): qty 2
  Filled 2022-10-20: qty 1
  Filled 2022-10-20: qty 2
  Filled 2022-10-20 (×8): qty 1

## 2022-10-20 MED ORDER — CLONIDINE HCL 0.1 MG PO TABS: 0.1000 mg | ORAL_TABLET | Freq: Three times a day (TID) | ORAL | Status: DC

## 2022-10-20 MED ORDER — LIDOCAINE 4 % EX CREA
TOPICAL_CREAM | Freq: Three times a day (TID) | CUTANEOUS | Status: DC
  Filled 2022-10-20 (×2): qty 5

## 2022-10-20 MED ORDER — HYDRALAZINE HCL 20 MG/ML IJ SOLN
10.0000 mg | INTRAMUSCULAR | Status: DC | PRN
  Administered 2022-10-20 – 2022-10-21 (×2): 10 mg via INTRAVENOUS
  Filled 2022-10-20 (×2): qty 1

## 2022-10-20 MED ORDER — INSULIN ASPART 100 UNIT/ML IJ SOLN
0.0000 [IU] | Freq: Three times a day (TID) | INTRAMUSCULAR | Status: DC
  Administered 2022-10-20: 3 [IU] via SUBCUTANEOUS
  Administered 2022-10-20 – 2022-10-21 (×2): 2 [IU] via SUBCUTANEOUS
  Administered 2022-10-21 (×2): 3 [IU] via SUBCUTANEOUS

## 2022-10-20 MED ORDER — LORAZEPAM 1 MG PO TABS
1.0000 mg | ORAL_TABLET | ORAL | Status: DC | PRN
  Filled 2022-10-20: qty 2

## 2022-10-20 MED ORDER — DIAZEPAM 5 MG/ML IJ SOLN
2.5000 mg | INTRAMUSCULAR | Status: AC | PRN
  Administered 2022-10-20 – 2022-10-21 (×3): 2.5 mg via INTRAVENOUS
  Filled 2022-10-20 (×3): qty 2

## 2022-10-20 NOTE — Assessment & Plan Note (Signed)
No acute issues. S/p SBRT Follow up with oncology as scheduled

## 2022-10-20 NOTE — Assessment & Plan Note (Signed)
No acute issues.

## 2022-10-20 NOTE — Assessment & Plan Note (Signed)
On statin.

## 2022-10-20 NOTE — Plan of Care (Signed)
  Problem: Education: Goal: Knowledge of General Education information will improve Description: Including pain rating scale, medication(s)/side effects and non-pharmacologic comfort measures Outcome: Progressing   Problem: Health Behavior/Discharge Planning: Goal: Ability to manage health-related needs will improve Outcome: Progressing   Problem: Clinical Measurements: Goal: Ability to maintain clinical measurements within normal limits will improve Outcome: Progressing Goal: Will remain free from infection Outcome: Progressing Goal: Diagnostic test results will improve Outcome: Progressing Goal: Respiratory complications will improve Outcome: Progressing Goal: Cardiovascular complication will be avoided Outcome: Progressing   Problem: Activity: Goal: Risk for activity intolerance will decrease Outcome: Progressing   Problem: Nutrition: Goal: Adequate nutrition will be maintained Outcome: Progressing   Problem: Coping: Goal: Level of anxiety will decrease Outcome: Progressing   Problem: Elimination: Goal: Will not experience complications related to bowel motility Outcome: Progressing Goal: Will not experience complications related to urinary retention Outcome: Progressing   Problem: Pain Managment: Goal: General experience of comfort will improve Outcome: Progressing   Problem: Safety: Goal: Ability to remain free from injury will improve Outcome: Progressing   Problem: Skin Integrity: Goal: Risk for impaired skin integrity will decrease Outcome: Progressing   Problem: Education: Goal: Knowledge of General Education information will improve Description: Including pain rating scale, medication(s)/side effects and non-pharmacologic comfort measures Outcome: Progressing   Problem: Health Behavior/Discharge Planning: Goal: Ability to manage health-related needs will improve Outcome: Progressing   Problem: Clinical Measurements: Goal: Ability to maintain  clinical measurements within normal limits will improve Outcome: Progressing Goal: Will remain free from infection Outcome: Progressing Goal: Diagnostic test results will improve Outcome: Progressing Goal: Respiratory complications will improve Outcome: Progressing Goal: Cardiovascular complication will be avoided Outcome: Progressing   Problem: Activity: Goal: Risk for activity intolerance will decrease Outcome: Progressing   Problem: Nutrition: Goal: Adequate nutrition will be maintained Outcome: Progressing   Problem: Coping: Goal: Level of anxiety will decrease Outcome: Progressing   Problem: Elimination: Goal: Will not experience complications related to bowel motility Outcome: Progressing Goal: Will not experience complications related to urinary retention Outcome: Progressing   Problem: Pain Managment: Goal: General experience of comfort will improve Outcome: Progressing   Problem: Safety: Goal: Ability to remain free from injury will improve Outcome: Progressing   Problem: Skin Integrity: Goal: Risk for impaired skin integrity will decrease Outcome: Progressing   Problem: Education: Goal: Ability to describe self-care measures that may prevent or decrease complications (Diabetes Survival Skills Education) will improve Outcome: Progressing Goal: Individualized Educational Video(s) Outcome: Progressing   Problem: Coping: Goal: Ability to adjust to condition or change in health will improve Outcome: Progressing   Problem: Fluid Volume: Goal: Ability to maintain a balanced intake and output will improve Outcome: Progressing   Problem: Health Behavior/Discharge Planning: Goal: Ability to identify and utilize available resources and services will improve Outcome: Progressing Goal: Ability to manage health-related needs will improve Outcome: Progressing   Problem: Metabolic: Goal: Ability to maintain appropriate glucose levels will improve Outcome:  Progressing   Problem: Nutritional: Goal: Maintenance of adequate nutrition will improve Outcome: Progressing Goal: Progress toward achieving an optimal weight will improve Outcome: Progressing   Problem: Skin Integrity: Goal: Risk for impaired skin integrity will decrease Outcome: Progressing   Problem: Tissue Perfusion: Goal: Adequacy of tissue perfusion will improve Outcome: Progressing   Problem: Safety: Goal: Non-violent Restraint(s) Outcome: Progressing

## 2022-10-20 NOTE — Assessment & Plan Note (Signed)
Synthroid 

## 2022-10-20 NOTE — Assessment & Plan Note (Signed)
Cautious pain control PRN. Holding scheduled sedating meds for now until AMS improves. PT when able to participate.

## 2022-10-20 NOTE — Progress Notes (Signed)
PMT no charge note.   Chart reviewed, Dr Delanna Ahmadi consult note reviewed, neurologist recommendations noted,EEG to be done after holiday, medication history reviewed.  Monitor hospital course and overall disease trajectory of illness so as to be able to guide further decision making/further goals of care discussions, PMT to follow.  No charge Loistine Chance MD [place] [facility].

## 2022-10-20 NOTE — Assessment & Plan Note (Signed)
Required in/out cath 12/23. Bladder scans and in/out PRN

## 2022-10-20 NOTE — Assessment & Plan Note (Signed)
Bowel regimen as needed

## 2022-10-20 NOTE — Progress Notes (Signed)
Pt arrived to 1229 very aggitated and attempting to kick staff. Restraints were applied to bilateral feet and prn ativan was given at this time. Pt is very confused unable to respond to use verbally besides moaning. Blood pressure reported to hospitalist at this time. Will monitor.

## 2022-10-20 NOTE — Assessment & Plan Note (Signed)
Anticoagulated with Xarelto for A-fib

## 2022-10-20 NOTE — Assessment & Plan Note (Signed)
Sliding scale coverage Hold home meds (Ozempic, Jardiance, metformin)

## 2022-10-20 NOTE — Assessment & Plan Note (Signed)
Rate controlled. Continue Xarelto, metoprolol, amiodarone when able to take PO. IV metoprolol PRN for rate control and BP

## 2022-10-20 NOTE — Assessment & Plan Note (Signed)
Suspect due to polypharmacy vs post-concussive.  No organic etiology has been identified.  MRI negative for stroke.  No evidence of infection. 12/24: mentation improved, more alert but confused and agitated.  Afternoon worsened agitated delirium, required restraints and transfer to ICU. 12/25: sedated, no improvement, still gets agitated when awake --Delirium precautions --Hold sedating meds (Lyrica, opioid, trazodone, Elavil) --Treat pain as needed, cautiously --Topical lidocaine to help neuropathic pain --Resume meds gradually after mentation has further improved. For agitated delirium --  --Soft restraints as needed for safety --Covering with "CIWA" for ?Lyrica withdrawal, PRN Ativan --PRN Haldol

## 2022-10-20 NOTE — Assessment & Plan Note (Signed)
Suspect due to polypharmacy. See acute metabolic encephalopathy. Fall precautions. PT/OT evaluations when mentation improves

## 2022-10-20 NOTE — Progress Notes (Addendum)
Progress Note    [name]   WHQ:[id number]  DOB: Jun 18, 1941  DOA: 10/09/2022     2 Date of Service: 10/20/2022     Subjective:  "[name] is a 81 y.o. 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 Of note on last fall he hit his head and was unable to get up and was immobilized  on floor for overnight , an unknown period of time. Family in am called EMS. He was noted in field to have slurred speech. Per wife he had had progressive decline over the last 2 weeks at home. She notes at baseline he has clear speech but is at baseline confused.  "  12/23: pt very lethargic / somnolent, difficult to arouse.  Seen by neurology.  High suspicion for polypharmacy causing patient altered mentation.  Recommend holding all sedating meds.  Seen by palliative care recommended baseline low dose IV pain control and diazepam to prevent seizure or Lyrica withdrawal.  12/24: pt more awake and interacts today, remains very confused, disoriented, nurses and wife report intermittent agitation and restlessness.  12/24: addendum - pt increasingly agitated, concern for developing Lyrica withdrawal at this point.  Will attempt to treat with gaba/benzo PRN for now.  If he escalates, may need Precedex in stepdown.  Hospital Problems Assessment and Plan: * Acute metabolic encephalopathy Suspect due to polypharmacy vs post-concussive.  No organic etiology has been identified.  MRI negative for stroke.  No evidence of infection. 12/24: mentation improved, more alert but confused and agitated --Delirium precautions --Hold sedating meds (Lyrica, opioid, trazodone, Elavil) --Treat pain as needed, cautiously --Topical lidocaine to help neuropathic pain --Resume meds  gradually after mentation has further improved.  Acute urinary retention Required in/out cath 12/23. Bladder scans and in/out PRN  Recurrent falls Suspect due to polypharmacy. See acute metabolic encephalopathy. Fall precautions. PT/OT evaluations when mentation improves  Change in mental status See acute metabolic encephalopathy  Chronic combined systolic and diastolic heart failure (HCC) Euvolemic to dry on exam. 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 S/p SBRT Follow up with oncology as scheduled  Ischemic cardiomyopathy No acute issues  PAF (paroxysmal atrial fibrillation) (HCC) Rate controlled. Continue Xarelto, metoprolol  Acquired thrombophilia ([place]) Anticoagulated with Xarelto for A-fib  Lumbar radiculopathy, chronic Cautious pain control PRN. Holding scheduled sedating meds for now until AMS improves. PT when able to participate.  Hyperlipidemia On statin  Chronic anticoagulation Xarelto for A-fib  Coronary artery disease involving native coronary artery of native heart with angina pectoris (HCC) Stable. Continue metoprolol, statin, Xarelto  Type 2 diabetes mellitus with diabetic neuropathy (HCC) Sliding scale coverage Hold home meds (Ozempic, Jardiance, metformin)  Sleep apnea CPAP  Hypertension Home regimen continued   Interval history: pt seen with wife at bedside.  He is more awake today but remains very confused. He can tell me his name, the year, but not place or situation. Speech content unrelated to conversation.       Objective Temp 97.13F HR 94, RR 18, BP 152/99, 96% spO2 on room air  Exam Physical Exam  General exam: awake, alert, no acute  distress, very confused HEENT: small abrasion left lateral forehead stable, moist mucus membranes, hearing grossly normal  Respiratory system:  CTAB, no wheezes, rales or rhonchi, normal respiratory effort. Cardiovascular system: normal S1/S2, RRR, no pedal edema.   Gastrointestinal system: soft, NT, ND, no HSM felt, +bowel sounds. Central nervous system: A&O x2. no gross focal neurologic deficits, normal speech Extremities: moves all , no edema, normal tone Skin: dry, intact, normal temperature Psychiatry: restlness, anxious mood, congruent affect, abnormal judgement and insight    Labs / Other Information Notable labs  ----  Glucose 168, BUN 29, negative for Covid, Flu A/B and RSV, negative respiratory viral panel.   Time spent: [facility] 10/20/2022, 1:55 PM

## 2022-10-20 NOTE — Assessment & Plan Note (Signed)
See acute metabolic encephalopathy

## 2022-10-20 NOTE — Assessment & Plan Note (Signed)
Stable. NPO - unable to take PO metoprolol, statin, Xarelto.

## 2022-10-20 NOTE — Assessment & Plan Note (Signed)
Euvolemic to dry on exam. Continue metoprolol and PRN Lasix

## 2022-10-20 NOTE — Assessment & Plan Note (Signed)
Xarelto for A-fib

## 2022-10-20 NOTE — Assessment & Plan Note (Signed)
CPAP.  

## 2022-10-20 NOTE — Assessment & Plan Note (Signed)
IV metoprolol PRN until taking PO

## 2022-10-20 NOTE — TOC Progression Note (Signed)
Transition of Care [facility]) - Progression Note    Patient Details  Name: [name] MRN: [id number] Date of Birth: 12/21/1940  Transition of Care [facility]) CM/SW Contact  Henrietta Dine, RN Phone Number: 10/20/2022, 5:02 PM  Clinical Narrative:    [facility] consult for SA counseling/education; pt disoriented x 4; unable to complete assessment.        Expected Discharge Plan and Services                                               Social Determinants of Health (SDOH) Interventions SDOH Screenings   Food Insecurity: No Food Insecurity (10/19/2022)  Housing: Low Risk  (10/19/2022)  Transportation Needs: No Transportation Needs (10/19/2022)  Utilities: Not At Risk (10/19/2022)  Depression (PHQ2-9): Low Risk  (05/17/2021)  Tobacco Use: Medium Risk (10/19/2022)    Readmission Risk Interventions     No data to display

## 2022-10-20 NOTE — Progress Notes (Signed)
Pt currently requiring restraints, unable to place nocturnal cpap on him at this time.  RN placed pt on 2l nasal cannula, spo2 100%.

## 2022-10-21 ENCOUNTER — Inpatient Hospital Stay (HOSPITAL_COMMUNITY): Payer: Medicare Other

## 2022-10-21 DIAGNOSIS — I480 Paroxysmal atrial fibrillation: Secondary | ICD-10-CM | POA: Diagnosis not present

## 2022-10-21 DIAGNOSIS — E8729 Other acidosis: Secondary | ICD-10-CM | POA: Diagnosis not present

## 2022-10-21 DIAGNOSIS — I5042 Chronic combined systolic (congestive) and diastolic (congestive) heart failure: Secondary | ICD-10-CM

## 2022-10-21 DIAGNOSIS — G9341 Metabolic encephalopathy: Secondary | ICD-10-CM | POA: Diagnosis not present

## 2022-10-21 LAB — BLOOD GAS, VENOUS
Acid-base deficit: 10.8 mmol/L — ABNORMAL HIGH (ref 0.0–2.0)
Bicarbonate: 16.4 mmol/L — ABNORMAL LOW (ref 20.0–28.0)
O2 Saturation: 68.3 %
Patient temperature: 36.9
pCO2, Ven: 40 mmHg — ABNORMAL LOW (ref 44–60)
pH, Ven: 7.22 — ABNORMAL LOW (ref 7.25–7.43)
pO2, Ven: 43 mmHg (ref 32–45)

## 2022-10-21 LAB — URINALYSIS, ROUTINE W REFLEX MICROSCOPIC
Bacteria, UA: NONE SEEN
Glucose, UA: >=1000 mg/dL — AB
Ketones, ur: >=80 mg/dL — AB
Leukocytes,Ua: NEGATIVE
Nitrite: NEGATIVE
Specific Gravity, Urine: 1.02 (ref 1.005–1.030)
pH: 5.5 (ref 5.0–8.0)

## 2022-10-21 LAB — BASIC METABOLIC PANEL
Anion gap: 17 — ABNORMAL HIGH (ref 5–15)
BUN: 26 mg/dL — ABNORMAL HIGH (ref 8–23)
CO2: 16 mmol/L — ABNORMAL LOW (ref 22–32)
Calcium: 9.8 mg/dL (ref 8.9–10.3)
Chloride: 109 mmol/L (ref 98–111)
Creatinine, Ser: 1.24 mg/dL (ref 0.61–1.24)
GFR, Estimated: 58 mL/min — ABNORMAL LOW (ref >60)
Glucose, Bld: 224 mg/dL — ABNORMAL HIGH (ref 70–99)
Potassium: 4.1 mmol/L (ref 3.5–5.1)
Sodium: 142 mmol/L (ref 135–145)

## 2022-10-21 LAB — AMMONIA: Ammonia: 18 umol/L (ref 9–35)

## 2022-10-21 LAB — GLUCOSE, CAPILLARY
Glucose-Capillary: 233 mg/dL — ABNORMAL HIGH (ref 70–99)
Glucose-Capillary: 174 mg/dL — ABNORMAL HIGH (ref 70–99)
Glucose-Capillary: 220 mg/dL — ABNORMAL HIGH (ref 70–99)
Glucose-Capillary: 223 mg/dL — ABNORMAL HIGH (ref 70–99)

## 2022-10-21 LAB — PROCALCITONIN: Procalcitonin: 0.12 ng/mL

## 2022-10-21 LAB — LACTIC ACID, PLASMA: Lactic Acid, Venous: 1.1 mmol/L (ref 0.5–1.9)

## 2022-10-21 LAB — BETA-HYDROXYBUTYRIC ACID: Beta-Hydroxybutyric Acid: 5.53 mmol/L — ABNORMAL HIGH (ref 0.05–0.27)

## 2022-10-21 MED ORDER — METOPROLOL TARTRATE 5 MG/5ML IV SOLN
2.5000 mg | Freq: Once | INTRAVENOUS | Status: AC
  Administered 2022-10-21: 2.5 mg via INTRAVENOUS

## 2022-10-21 MED ORDER — FAMOTIDINE IN NACL 20-0.9 MG/50ML-% IV SOLN
20.0000 mg | INTRAVENOUS | Status: DC
  Administered 2022-10-21 – 2022-10-23 (×3): 20 mg via INTRAVENOUS
  Filled 2022-10-21 (×3): qty 50

## 2022-10-21 MED ORDER — HYDROMORPHONE HCL 1 MG/ML IJ SOLN
0.5000 mg | INTRAMUSCULAR | Status: DC
  Administered 2022-10-21 – 2022-10-23 (×9): 0.5 mg via INTRAVENOUS
  Filled 2022-10-21 (×8): qty 1

## 2022-10-21 MED ORDER — METOPROLOL TARTRATE 5 MG/5ML IV SOLN
5.0000 mg | INTRAVENOUS | Status: DC | PRN
  Administered 2022-10-21: 5 mg via INTRAVENOUS
  Filled 2022-10-21: qty 5

## 2022-10-21 MED ORDER — METOPROLOL TARTRATE 5 MG/5ML IV SOLN
INTRAVENOUS | Status: AC
  Filled 2022-10-21: qty 5

## 2022-10-21 MED ORDER — SODIUM CHLORIDE 0.45 % IV SOLN
INTRAVENOUS | Status: DC
  Filled 2022-10-21 (×4): qty 75

## 2022-10-21 MED ORDER — SODIUM CHLORIDE 0.9 % IV SOLN: INTRAVENOUS | Status: DC

## 2022-10-21 MED ORDER — METOPROLOL TARTRATE 5 MG/5ML IV SOLN
5.0000 mg | INTRAVENOUS | Status: DC | PRN
  Administered 2022-10-21 – 2022-10-23 (×4): 5 mg via INTRAVENOUS
  Filled 2022-10-21 (×4): qty 5
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
## 2022-10-21 NOTE — Progress Notes (Signed)
Pt currently requiring restraints for his safety, unable to place nocturnal cpap at this time.  RN aware.

## 2022-10-21 NOTE — Progress Notes (Signed)
Attempted to call wife at mobile number listed in chart to update her on patient condition and bed status, phone call went straight to voicemail. Attempted to call pt's house phone, phone call went straight to voicemail.

## 2022-10-21 NOTE — Progress Notes (Addendum)
Progress Note    [name]   JKD:[id number]  DOB: 1941-06-26  DOA: 09/27/2022     3 Date of Service: 10/21/2022     Subjective:  HPI on admission: "[name] is a 81 y.o. 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 Of note on last fall he hit his head and was unable to get up and was immobilized  on floor for overnight , an unknown period of time. Family in am called EMS. He was noted in field to have slurred speech. Per wife he had had progressive decline over the last 2 weeks at home. She notes at baseline he has clear speech but is at baseline confused.  "  12/23: pt very lethargic / somnolent, difficult to arouse.  Seen by neurology.  High suspicion for polypharmacy causing patient altered mentation.  Recommend holding all sedating meds.  Seen by palliative care recommended baseline low dose IV pain control and diazepam to prevent seizure or Lyrica withdrawal.  12/24: pt more awake and interacts today, remains very confused, disoriented, nurses and wife report intermittent agitation and restlessness.  12/24: PM addendum - pt increasingly agitated, concern for developing Lyrica withdrawal at this point.  Will attempt to treat with gaba/benzo PRN for now.  If he escalates, may need Precedex in stepdown.  Transferred to ICU for closer monitoring.  Unable to reach wife to update.  12/25: remains tachycardic, tachypneic, episode of hypoxia reported overnight.  Chest xray reviewed, appears similar to prior.   Wife updated this afternoon.   Hospital Problems Assessment and Plan: * Acute metabolic encephalopathy Suspect due to polypharmacy vs post-concussive.  No organic etiology has been identified.  MRI negative for stroke.  No evidence of  infection. 12/24: mentation improved, more alert but confused and agitated.  Afternoon worsened agitated delirium, required restraints and transfer to ICU. 12/25: sedated, no improvement, still gets agitated when awake --Delirium precautions --Hold sedating meds (Lyrica, opioid, trazodone, Elavil) --Treat pain as needed, cautiously --Topical lidocaine to help neuropathic pain --Resume meds gradually after mentation has further improved. For agitated delirium --  --Soft restraints as needed for safety --Covering with "CIWA" for ?Lyrica withdrawal, PRN Ativan --PRN Haldol  High anion gap metabolic acidosis 09/98: BMP with bicarb 16, gap 17.  VBG with pH 7.22, pCO2 40, bicarb 16.4.   --Checking for ketones, lactic acid --CXR and UA --Started 1/2NS-bicarb fluids for now given NPO status with AMS --Hold Jardiance  Acute urinary retention Required in/out cath 12/23. Bladder scans and in/out PRN  Recurrent falls Suspect due to polypharmacy. See acute metabolic encephalopathy. Fall precautions. PT/OT evaluations when mentation improves  Change in mental status See acute metabolic encephalopathy  Chronic combined systolic and diastolic heart failure (HCC) Euvolemic to dry on exam. 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 S/p SBRT Follow up with oncology as scheduled  Ischemic cardiomyopathy No acute issues  PAF (paroxysmal atrial fibrillation) (HCC) Rate controlled. Continue Xarelto, metoprolol, amiodarone when able to take PO. IV metoprolol PRN for rate control and BP  Acquired thrombophilia (HCC) Anticoagulated with Xarelto for A-fib  Lumbar radiculopathy, chronic Cautious pain  control PRN. Holding scheduled sedating meds for now until AMS improves. PT when able to participate.  Hyperlipidemia Hold statin until taking  PO  Chronic anticoagulation Xarelto for A-fib  Coronary artery disease involving native coronary artery of native heart with angina pectoris (HCC) Stable. Continue metoprolol, statin, Xarelto  Type 2 diabetes mellitus with diabetic neuropathy (HCC) Sliding scale coverage Hold home meds (Ozempic, Jardiance, metformin)  Sleep apnea CPAP  Hypertension Home regimen continued   Interval history: pt seen in ICU this AM, wife not yet present.  Pt sedated, tachycardic, tachypneic. RN reported hypoxia overnight.  Still becomes very agitated when he awakens.     Objective Temp 98.2 F HR 120-130's, RR 30's, BP 158/74, 96% spO2 on 2 L/min [place] O2  Exam Physical Exam  General exam: sedated, no acute distress, appears comfortable HEENT: small abrasion left lateral forehead stable, moist mucus membranes, hearing grossly normal  Respiratory system: diminished on left, clear on right, tachypneic, on 2 L/min [place] O2, no wheezes or rhonchi hear Cardiovascular system: tachycardic, no peripheral edema.   Gastrointestinal system: soft, NT, ND Central nervous system: A&O x2. no gross focal neurologic deficits, normal speech Extremities: in soft 4 point restraints, no edema, normal tone Skin: dry, intact, normal temperature, no rashes seen Psychiatry: unable to assess, sedated   Labs / Other Information Notable labs  ----  BMP with bicarb 16, anion gap 17, glucose 224, BUN 26.  Creatinine 1.24 up slightly from 0.94. VBG: pH 7.22, pCO2 40, bicarb 16.4, acid base deficit 10.8   Repeated chest x-ray today -showed slight improvement in left upper and left lower lobe patchy infiltrates which are areas with chronic changes from his lung cancer and radiation, small left pleural effusion.  No new focal infiltrates were seen.  Pending studies: Urinalysis, procalcitonin, lactic acid, beta hydroxybutyrate   Family communication - wife at bedside on AM rounds. 12/24: Attempted to call wife to update on  transfer to ICU for closer observation and management of worsening agitated delirium.  Phone did not ring but went straight to automated voicemail, therefore I did not leave a message.  12/25: updated wife this afternoon   Time spent: 65 minutes including time at bedside and in coordination of care   Dr. Nicole Kindred, DO [facility] 10/21/2022, 12:45 PM

## 2022-10-21 NOTE — Assessment & Plan Note (Signed)
12/25: BMP with bicarb 16, gap 17.  VBG with pH 7.22, pCO2 40, bicarb 16.4. 12/26: worsening.   Suspect starvation ketoacidosis vs DKA (takes Jardiance) --Insulin drip for now --Bicarb infusion with D5 --Hold Jardiance

## 2022-10-22 ENCOUNTER — Inpatient Hospital Stay (HOSPITAL_COMMUNITY)
Admit: 2022-10-22 | Discharge: 2022-10-22 | Disposition: A | Discharge status: Home / Self Care | Payer: Medicare Other | Attending: Neurology | Admitting: Neurology

## 2022-10-22 DIAGNOSIS — R9431 Abnormal electrocardiogram [ECG] [EKG]: Secondary | ICD-10-CM | POA: Diagnosis not present

## 2022-10-22 DIAGNOSIS — R4182 Altered mental status, unspecified: Secondary | ICD-10-CM

## 2022-10-22 DIAGNOSIS — E870 Hyperosmolality and hypernatremia: Secondary | ICD-10-CM | POA: Diagnosis not present

## 2022-10-22 DIAGNOSIS — D72829 Elevated white blood cell count, unspecified: Secondary | ICD-10-CM

## 2022-10-22 DIAGNOSIS — G9341 Metabolic encephalopathy: Secondary | ICD-10-CM | POA: Diagnosis not present

## 2022-10-22 DIAGNOSIS — E8729 Other acidosis: Secondary | ICD-10-CM | POA: Diagnosis not present

## 2022-10-22 LAB — BASIC METABOLIC PANEL
Anion gap: 20 — ABNORMAL HIGH (ref 5–15)
Anion gap: 17 — ABNORMAL HIGH (ref 5–15)
Anion gap: 11 (ref 5–15)
Anion gap: 6 (ref 5–15)
BUN: 43 mg/dL — ABNORMAL HIGH (ref 8–23)
BUN: 39 mg/dL — ABNORMAL HIGH (ref 8–23)
BUN: 34 mg/dL — ABNORMAL HIGH (ref 8–23)
BUN: 32 mg/dL — ABNORMAL HIGH (ref 8–23)
CO2: 16 mmol/L — ABNORMAL LOW (ref 22–32)
CO2: 18 mmol/L — ABNORMAL LOW (ref 22–32)
CO2: 23 mmol/L (ref 22–32)
CO2: 28 mmol/L (ref 22–32)
Calcium: 9.5 mg/dL (ref 8.9–10.3)
Calcium: 9.4 mg/dL (ref 8.9–10.3)
Calcium: 9 mg/dL (ref 8.9–10.3)
Calcium: 9.1 mg/dL (ref 8.9–10.3)
Chloride: 116 mmol/L — ABNORMAL HIGH (ref 98–111)
Chloride: 117 mmol/L — ABNORMAL HIGH (ref 98–111)
Chloride: 117 mmol/L — ABNORMAL HIGH (ref 98–111)
Chloride: 117 mmol/L — ABNORMAL HIGH (ref 98–111)
Creatinine, Ser: 1.55 mg/dL — ABNORMAL HIGH (ref 0.61–1.24)
Creatinine, Ser: 1.49 mg/dL — ABNORMAL HIGH (ref 0.61–1.24)
Creatinine, Ser: 1.38 mg/dL — ABNORMAL HIGH (ref 0.61–1.24)
Creatinine, Ser: 1.19 mg/dL (ref 0.61–1.24)
GFR, Estimated: 44 mL/min — ABNORMAL LOW (ref >60)
GFR, Estimated: 46 mL/min — ABNORMAL LOW (ref >60)
GFR, Estimated: 51 mL/min — ABNORMAL LOW (ref >60)
GFR, Estimated: >60 mL/min (ref >60)
Glucose, Bld: 232 mg/dL — ABNORMAL HIGH (ref 70–99)
Glucose, Bld: 227 mg/dL — ABNORMAL HIGH (ref 70–99)
Glucose, Bld: 188 mg/dL — ABNORMAL HIGH (ref 70–99)
Glucose, Bld: 197 mg/dL — ABNORMAL HIGH (ref 70–99)
Potassium: 3.9 mmol/L (ref 3.5–5.1)
Potassium: 3.4 mmol/L — ABNORMAL LOW (ref 3.5–5.1)
Potassium: 3 mmol/L — ABNORMAL LOW (ref 3.5–5.1)
Potassium: 3.5 mmol/L (ref 3.5–5.1)
Sodium: 152 mmol/L — ABNORMAL HIGH (ref 135–145)
Sodium: 152 mmol/L — ABNORMAL HIGH (ref 135–145)
Sodium: 151 mmol/L — ABNORMAL HIGH (ref 135–145)
Sodium: 151 mmol/L — ABNORMAL HIGH (ref 135–145)

## 2022-10-22 LAB — GLUCOSE, CAPILLARY
Glucose-Capillary: 152 mg/dL — ABNORMAL HIGH (ref 70–99)
Glucose-Capillary: 163 mg/dL — ABNORMAL HIGH (ref 70–99)
Glucose-Capillary: 166 mg/dL — ABNORMAL HIGH (ref 70–99)
Glucose-Capillary: 171 mg/dL — ABNORMAL HIGH (ref 70–99)
Glucose-Capillary: 174 mg/dL — ABNORMAL HIGH (ref 70–99)
Glucose-Capillary: 177 mg/dL — ABNORMAL HIGH (ref 70–99)
Glucose-Capillary: 179 mg/dL — ABNORMAL HIGH (ref 70–99)
Glucose-Capillary: 182 mg/dL — ABNORMAL HIGH (ref 70–99)
Glucose-Capillary: 204 mg/dL — ABNORMAL HIGH (ref 70–99)
Glucose-Capillary: 214 mg/dL — ABNORMAL HIGH (ref 70–99)
Glucose-Capillary: 220 mg/dL — ABNORMAL HIGH (ref 70–99)

## 2022-10-22 LAB — COMPREHENSIVE METABOLIC PANEL
ALT: 26 U/L (ref 0–44)
AST: 21 U/L (ref 15–41)
Albumin: 3.5 g/dL (ref 3.5–5.0)
Alkaline Phosphatase: 85 U/L (ref 38–126)
Anion gap: 21 — ABNORMAL HIGH (ref 5–15)
BUN: 39 mg/dL — ABNORMAL HIGH (ref 8–23)
CO2: 15 mmol/L — ABNORMAL LOW (ref 22–32)
Calcium: 9.4 mg/dL (ref 8.9–10.3)
Chloride: 115 mmol/L — ABNORMAL HIGH (ref 98–111)
Creatinine, Ser: 1.59 mg/dL — ABNORMAL HIGH (ref 0.61–1.24)
GFR, Estimated: 43 mL/min — ABNORMAL LOW (ref >60)
Glucose, Bld: 235 mg/dL — ABNORMAL HIGH (ref 70–99)
Potassium: 3.7 mmol/L (ref 3.5–5.1)
Sodium: 151 mmol/L — ABNORMAL HIGH (ref 135–145)
Total Bilirubin: 1.7 mg/dL — ABNORMAL HIGH (ref 0.3–1.2)
Total Protein: 7.2 g/dL (ref 6.5–8.1)

## 2022-10-22 LAB — CBC
HCT: 40.6 % (ref 39.0–52.0)
Hemoglobin: 13.1 g/dL (ref 13.0–17.0)
MCH: 32 pg (ref 26.0–34.0)
MCHC: 32.3 g/dL (ref 30.0–36.0)
MCV: 99.3 fL (ref 80.0–100.0)
Platelets: 228 10*3/uL (ref 150–400)
RBC: 4.09 MIL/uL — ABNORMAL LOW (ref 4.22–5.81)
RDW: 16.1 % — ABNORMAL HIGH (ref 11.5–15.5)
WBC: 24.1 10*3/uL — ABNORMAL HIGH (ref 4.0–10.5)
nRBC: 0 % (ref 0.0–0.2)

## 2022-10-22 LAB — MAGNESIUM: Magnesium: 2.3 mg/dL (ref 1.7–2.4)

## 2022-10-22 LAB — PHOSPHORUS: Phosphorus: 4.9 mg/dL — ABNORMAL HIGH (ref 2.5–4.6)

## 2022-10-22 LAB — APTT: aPTT: 21 seconds — ABNORMAL LOW (ref 24–36)

## 2022-10-22 LAB — BETA-HYDROXYBUTYRIC ACID
Beta-Hydroxybutyric Acid: >8 mmol/L — ABNORMAL HIGH (ref 0.05–0.27)
Beta-Hydroxybutyric Acid: 4.05 mmol/L — ABNORMAL HIGH (ref 0.05–0.27)

## 2022-10-22 LAB — HEMOGLOBIN A1C
Hgb A1c MFr Bld: 8.4 % — ABNORMAL HIGH (ref 4.8–5.6)
Mean Plasma Glucose: 194 mg/dL

## 2022-10-22 LAB — HEPARIN LEVEL (UNFRACTIONATED)
Heparin Unfractionated: <0.1 IU/mL — ABNORMAL LOW (ref 0.30–0.70)
Heparin Unfractionated: 0.26 IU/mL — ABNORMAL LOW (ref 0.30–0.70)

## 2022-10-22 MED ORDER — SODIUM CHLORIDE 0.9 % IV SOLN
2.0000 g | Freq: Two times a day (BID) | INTRAVENOUS | Status: DC
  Administered 2022-10-22 (×2): 2 g via INTRAVENOUS
  Filled 2022-10-22 (×3): qty 12.5

## 2022-10-22 MED ORDER — SODIUM CHLORIDE 0.9 % IV SOLN
4.0000 mg/h | INTRAVENOUS | Status: DC
  Administered 2022-10-22: 0.5 mg/h via INTRAVENOUS
  Administered 2022-10-23: 1.5 mg/h via INTRAVENOUS
  Administered 2022-10-24 – 2022-10-25 (×2): 4 mg/h via INTRAVENOUS
  Filled 2022-10-22 (×7): qty 5

## 2022-10-22 MED ORDER — ORAL CARE MOUTH RINSE
15.0000 mL | OROMUCOSAL | Status: DC | PRN
  Administered 2022-10-22: 15 mL via OROMUCOSAL

## 2022-10-22 MED ORDER — ORAL CARE MOUTH RINSE
15.0000 mL | OROMUCOSAL | Status: DC
  Administered 2022-10-22 – 2022-10-24 (×12): 15 mL via OROMUCOSAL

## 2022-10-22 MED ORDER — HYDROMORPHONE BOLUS VIA INFUSION
1.0000 mg | INTRAVENOUS | Status: DC | PRN
  Administered 2022-10-22 – 2022-10-24 (×11): 1 mg via INTRAVENOUS

## 2022-10-22 MED ORDER — KCL-LACTATED RINGERS-D5W 20 MEQ/L IV SOLN
INTRAVENOUS | Status: DC
  Filled 2022-10-22 (×4): qty 1000

## 2022-10-22 MED ORDER — SODIUM BICARBONATE 8.4 % IV SOLN
INTRAVENOUS | Status: DC
  Filled 2022-10-22 (×2): qty 150
  Filled 2022-10-22 (×2): qty 1000

## 2022-10-22 MED ORDER — INSULIN REGULAR(HUMAN) IN NACL 100-0.9 UT/100ML-% IV SOLN
INTRAVENOUS | Status: DC
  Administered 2022-10-22: 1 [IU]/h via INTRAVENOUS
  Administered 2022-10-23: 5 [IU]/h via INTRAVENOUS
  Filled 2022-10-22 (×2): qty 100

## 2022-10-22 MED ORDER — POTASSIUM CHLORIDE 10 MEQ/100ML IV SOLN
10.0000 meq | INTRAVENOUS | Status: AC
  Administered 2022-10-22 (×2): 10 meq via INTRAVENOUS
  Filled 2022-10-22 (×2): qty 100

## 2022-10-22 MED ORDER — HYDROMORPHONE HCL-NACL 50-0.9 MG/50ML-% IV SOLN
0.5000 mg/h | INTRAVENOUS | Status: DC
  Filled 2022-10-22: qty 50

## 2022-10-22 MED ORDER — HEPARIN BOLUS VIA INFUSION
4000.0000 [IU] | Freq: Once | INTRAVENOUS | Status: AC
  Administered 2022-10-22: 4000 [IU] via INTRAVENOUS
  Filled 2022-10-22: qty 4000

## 2022-10-22 MED ORDER — DEXTROSE IN LACTATED RINGERS 5 % IV SOLN: INTRAVENOUS | Status: DC

## 2022-10-22 MED ORDER — HEPARIN (PORCINE) 25000 UT/250ML-% IV SOLN
1650.0000 [IU]/h | INTRAVENOUS | Status: DC
  Administered 2022-10-22: 1250 [IU]/h via INTRAVENOUS
  Administered 2022-10-23: 1650 [IU]/h via INTRAVENOUS
  Administered 2022-10-23: 1350 [IU]/h via INTRAVENOUS
  Filled 2022-10-22 (×3): qty 250

## 2022-10-22 MED ORDER — HYDROMORPHONE HCL 1 MG/ML IJ SOLN
1.0000 mg | Freq: Once | INTRAMUSCULAR | Status: AC
  Administered 2022-10-22: 1 mg via INTRAVENOUS

## 2022-10-22 MED ORDER — LACTATED RINGERS IV BOLUS
500.0000 mL | Freq: Once | INTRAVENOUS | Status: AC
  Administered 2022-10-22: 500 mL via INTRAVENOUS

## 2022-10-22 MED ORDER — DILTIAZEM LOAD VIA INFUSION
10.0000 mg | Freq: Once | INTRAVENOUS | Status: AC
  Administered 2022-10-22: 10 mg via INTRAVENOUS
  Filled 2022-10-22: qty 10

## 2022-10-22 MED ORDER — LACTATED RINGERS IV SOLN: INTRAVENOUS | Status: DC

## 2022-10-22 MED ORDER — POTASSIUM CHLORIDE 10 MEQ/100ML IV SOLN
10.0000 meq | INTRAVENOUS | Status: AC
  Administered 2022-10-22 (×4): 10 meq via INTRAVENOUS
  Filled 2022-10-22 (×4): qty 100

## 2022-10-22 MED ORDER — DILTIAZEM HCL-DEXTROSE 125-5 MG/125ML-% IV SOLN (PREMIX)
5.0000 mg/h | INTRAVENOUS | Status: DC
  Administered 2022-10-22: 5 mg/h via INTRAVENOUS
  Administered 2022-10-22 – 2022-10-23 (×3): 15 mg/h via INTRAVENOUS
  Filled 2022-10-22 (×4): qty 125

## 2022-10-22 MED ORDER — DIAZEPAM 5 MG/ML IJ SOLN: 10.0000 mg | Freq: Once | INTRAMUSCULAR | Status: AC

## 2022-10-22 MED ORDER — DIAZEPAM 5 MG/ML IJ SOLN
5.0000 mg | INTRAMUSCULAR | Status: DC
  Administered 2022-10-22 – 2022-10-25 (×14): 5 mg via INTRAVENOUS
  Filled 2022-10-22 (×15): qty 2

## 2022-10-22 MED ORDER — HYDROMORPHONE HCL 1 MG/ML IJ SOLN
INTRAMUSCULAR | Status: AC
  Filled 2022-10-22: qty 1

## 2022-10-22 MED ORDER — DEXTROSE 50 % IV SOLN: 0.0000 mL | INTRAVENOUS | Status: DC | PRN

## 2022-10-22 MED ORDER — DIAZEPAM 5 MG/ML IJ SOLN
INTRAMUSCULAR | Status: AC
  Administered 2022-10-22: 10 mg via INTRAVENOUS
  Filled 2022-10-22: qty 2

## 2022-10-22 NOTE — Assessment & Plan Note (Signed)
Due to prolonged encephalopathy and NPO as a result.  Now on fluids for this and worsening acidosis.

## 2022-10-22 NOTE — Progress Notes (Signed)
Pharmacy: Re- heparin   Patient is an 80 y.o M with lung cancer who presented to the ED on 10/26/2022 s/p fall with c/o generalized weakness. He's currently on heparin drip for afib.  Palliative care team is seeing patient and the plan is likely to transition pt to comfort care.  - heparin level collected at 7p is sub-therapeutic at 0.26 - per pt's RN, no issues with IV line and no bleeding noted  Goal of Therapy:  Heparin level 0.3-0.7 units/ml aPTT 66-102 seconds Monitor platelets by anticoagulation protocol: Yes  Plan:  - increase heparin drip to 1350 units/hr - check 8 hr heparin level  - monitor for s/sx bleeding   Dia Sitter, PharmD, BCPS 10/22/2022 7:30 PM

## 2022-10-22 NOTE — Assessment & Plan Note (Addendum)
New leukocytosis with WBC 24.1.  Unclear source of infection with yesterday UA and chest x-ray.  He could be silently aspirating given his profound encephalopathy of the past few days. --Started on cefepime -- Monitor CBC --Further infectious evaluation pending goals of care discussions underway today with palliative

## 2022-10-22 NOTE — Progress Notes (Signed)
Progress Note    [name]   LSL:[id number]  DOB: 08/11/1941  DOA: 10/02/2022     4 Date of Service: 10/22/2022     Subjective:  HPI on admission: "[name] is a 81 y.o. 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 Of note on last fall he hit his head and was unable to get up and was immobilized  on floor for overnight , an unknown period of time. Family in am called EMS. He was noted in field to have slurred speech. Per wife he had had progressive decline over the last 2 weeks at home. She notes at baseline he has clear speech but is at baseline confused.  "  12/23: pt very lethargic / somnolent, difficult to arouse.  Seen by neurology.  High suspicion for polypharmacy causing patient altered mentation.  Recommend holding all sedating meds.  Seen by palliative care recommended baseline low dose IV pain control and diazepam to prevent seizure or Lyrica withdrawal.  12/24: pt more awake and interacts today, remains very confused, disoriented, nurses and wife report intermittent agitation and restlessness.  12/24: PM addendum - pt increasingly agitated, concern for developing Lyrica withdrawal at this point.  Will attempt to treat with gaba/benzo PRN for now.  If he escalates, may need Precedex in stepdown.  Transferred to ICU for closer monitoring.  Unable to reach wife to update.  12/25: remains tachycardic, tachypneic, episode of hypoxia reported overnight.  Chest xray reviewed, appears similar to prior.   Wife updated this afternoon.  12/26: worsening acidosis, pt appears suffering.  Remains tachycardic, tachypneic, resumption of scheduled pain medication does not seem to help.  Irregular respirations.  WBC 24k which is new, started  antibiotics.  Palliative care met again with wife and plan to transition to full comfort measures once family arrive from out of state.  Continuing to treat underlying issues for now.  Poor prognosis, clinically deteriorating.   Patient is high risk for mortality this admission.   Hospital Problems Assessment and Plan: * Acute metabolic encephalopathy Suspect due to polypharmacy vs post-concussive.  No organic etiology has been identified.  MRI negative for stroke.  No evidence of infection. 12/24: mentation improved, more alert but confused and agitated.  Afternoon worsened agitated delirium, required restraints and transfer to ICU. 12/25--26: sedated, no improvement, still gets severely agitated when awake --Delirium precautions --Hold sedating meds (Lyrica, opioid, trazodone, Elavil) --Palliative care starting Dilaudid infusion for his acute on chronic pain and plan to transition to comfort care soon --Topical lidocaine to help neuropathic pain --Soft restraints as needed for safety, but wife would like for these to be removed as soon as possible --Have been covering for ?Lyrica withdrawal with PRN Ativan based on CIWA.  --Agree with Palliative care's approach with scheduled IV diazepam. --Haldol d/c'd due to QT prolongation  High anion gap metabolic acidosis 11/57: BMP with bicarb 16, gap 17.  VBG with pH 7.22, pCO2 40, bicarb 16.4. 12/26: worsening.   Suspect starvation ketoacidosis vs DKA (takes Jardiance) --Insulin drip for now --Bicarb infusion with D5 --Hold Jardiance  Acute urinary retention Required in/out cath 12/23. Bladder scans and in/out PRN  Recurrent falls Suspect due to polypharmacy. See acute metabolic encephalopathy. Fall precautions. PT/OT evaluations  when mentation improves  Change in mental status See acute metabolic encephalopathy  Chronic combined systolic and diastolic heart failure (HCC) Euvolemic to dry on exam. Continue metoprolol and PRN  Lasix  Prolonged QT interval Hold amiodarone.  D/C Haldol PRN. 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 S/p SBRT Follow up with oncology as scheduled  Ischemic cardiomyopathy No acute issues  PAF (paroxysmal atrial fibrillation) (HCC) Rate controlled.  Unable to take PO Xarelto, metoprolol, amiodarone when able to take PO. IV metoprolol PRN for rate control and BP Heparin drip  Acquired thrombophilia (HCC) Anticoagulated with Xarelto for A-fib  AKI (acute kidney injury) ([place]) Due to prolonged encephalopathy and NPO as a result.  Now on fluids for this and worsening acidosis.  Lumbar radiculopathy, chronic Cautious pain control PRN. Holding scheduled sedating meds for now until AMS improves. PT when able to participate.  Hyperlipidemia Hold statin until taking PO  Chronic anticoagulation Xarelto for A-fib - unable to take, NPO Heparin for now  Coronary artery disease involving native coronary artery of native heart with angina pectoris ([place]) Stable. NPO - unable to take PO metoprolol, statin, Xarelto.  Leukocytosis New leukocytosis with WBC 24.1.  Unclear source of infection with yesterday UA and chest x-ray.  He could be silently aspirating given his profound encephalopathy of the past few days. --Started on cefepime -- Monitor CBC --Further infectious evaluation pending goals of care discussions underway today with palliative  Type 2 diabetes mellitus with diabetic neuropathy (HCC) Sliding scale coverage Hold home meds (Ozempic, Jardiance, metformin)  Sleep apnea CPAP  Hypertension IV metoprolol PRN until taking PO   Interval history: pt seen in ICU this AM, wife not yet present.  Pt appears suffering.  He won't respond to voice & tactile stimulation, but is constantly moaning and restless.  Remains in soft restraints  with plan to attempt removing them later this morning.  No acute events reported overnight.         Objective Temp 98.2 F HR 120-130's, RR 30's, BP 158/74, 96% spO2 on 2 L/min [place] O2  Exam Physical Exam  General exam: sedated, no acute distress, appears in pain and restless HEENT: dressing over abrasion left lateral forehead, keeps eyes closed Respiratory system: diminished on left, clear on right, tachypneic, on 2 L/min [place] O2, no wheezes or rhonchi hear Cardiovascular system: tachycardic, no peripheral edema.   Gastrointestinal system: soft, NT, ND Central nervous system: A&O x2. no gross focal neurologic deficits, normal speech Extremities: in soft 4 point restraints, no edema, normal tone Skin: dry, intact, normal temperature, no rashes seen Psychiatry: unable to assess, sedated   Labs / Other Information Notable labs  ----  BMP with sodium 132, potassium 3.4, chloride 117, bicarb 18 improved slightly, glucose 227, BUN 39, creatinine 1.49 from 1.55, anion gap 17  Lactic acid yesterday afternoon normal 1.1, procalcitonin 0.12.  CBC this morning with new leukocytosis 24.1   Repeated chest x-ray 12/25 - showed slight improvement in left upper and left lower lobe patchy infiltrates which are areas with chronic changes from his lung cancer and radiation, small left pleural effusion.  No new focal infiltrates were seen.     Family communication - wife at bedside on AM rounds. 12/24: Attempted to call wife to update on transfer to ICU for closer observation and management of worsening agitated delirium.  Phone did not ring  but went straight to automated voicemail, therefore I did not leave a message.  12/25: updated wife this afternoon   Time spent: 65 minutes including time at bedside and in coordination of care   Dr. Nicole Kindred, DO [facility] 10/22/2022, 2:06 PM

## 2022-10-22 NOTE — Progress Notes (Signed)
Palliative Care Progress Note  Significant decline today. Patient appears in distress, agitated and having unusual respiratory pattern similar to [place] respirations. Tmax 101 in last 24 hours. WBC very elevated. EEG no seizure foci. 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   I met with his wife in the ICU consultation room given his continued and rapid decline over past 24 hours. She was able to get one daughter, Ebony Hail on the phone-the other three were not reachable and went to VM. This is a second marriage for them, 3 of the children are his and one is hers but she reports that they have always considered all of them "their" children. They also have multiple grandchildren. His wife has largely navigated this health decline and challenges alone and is very devoted to his care.   I updated her on his condition and attempted to explain the complex medical issues he has going on -but I was clear in telling her that I was concerned he would not survive this hospitalization and if he did he would not be even close to his previous cognitive and physical functioning level prior to admission.  We discussed his level of suffering  and she wants comfort care and for him to not be placed in restraints. Given these goals will not start artificial nutrition. She would like [place] to continue to support with medical interventions like antibiotics and electrolytes/fluids until his children can be notified and get here to see him.   PNA related to lung cancer and multifocal dx from aspiration, severe delirium in setting of TBI, respiratory and metabolic acidosis, opioid dependence and pregabalin toxicity. Now with sepsis and organ dysfunction.  Recommendations:  DNR, Limited interventions-soon will be comfort measures likely once family arrives. Scheduled continuous infusion of opioids for his acute on chronic pain Valium for w/d symptoms and sedation on scheduled basis Treat his acidosis to  the degree possible  No artifical feeding Treat his PNA/infection  Wife really just wants him to not suffer anymore, but she also desperately wants to communicate with him one last time-I was honest with her that this may not be possible. 8. Pending comfort care transition in next 24 hours- he will likely have a hospital death.  Lane Hacker, DO Palliative Medicine  90 minutes

## 2022-10-22 NOTE — Assessment & Plan Note (Signed)
Hold amiodarone.  D/C Haldol PRN. Maintain K>4, Mg>2 Telemetry

## 2022-10-22 NOTE — Progress Notes (Signed)
EEG complete - results pending 

## 2022-10-22 NOTE — Progress Notes (Signed)
Pharmacy Antibiotic Note  [name] is a 81 y.o. male admitted on 10/08/2022 with sepsis.  Pharmacy has been consulted for cefepime dosing.  Today, 10/22/22 SCr elevated, increasing.  Tmax 101 F  Plan: Cefepime 2 g IV q12h Follow renal function and culture data  Height: 6' (182.9 cm) Weight: 90.7 kg (200 lb) IBW/kg (Calculated) : 77.6  Temp (24hrs), Avg:99.5 F (37.5 C), Min:98.4 F (36.9 C), Max:101 F (38.3 C)  Recent Labs  Lab 09/30/2022 1430 10/19/22 0626 10/20/22 0729 10/21/22 0321 10/21/22 1201 10/22/22 0257  WBC 7.0 5.6  --   --   --  24.1*  CREATININE 0.99 0.89 0.99 1.24  --  1.59*  LATICACIDVEN 1.7  --   --   --  1.1  --     Estimated Creatinine Clearance: 38.6 mL/min (A) (by C-G formula based on SCr of 1.59 mg/dL (H)).    Allergies  Allergen Reactions   Cymbalta [Duloxetine Hcl] Nausea And Vomiting and Other (See Comments)    PATIENT STATED THAT HE FELT LIKE HE "WAS GOING TO DIE" Rapid drop in blood pressure; sent him to the ER X2   Neurontin [Gabapentin] Other (See Comments)    "Makes me goofy, keeps me off balance, clouds my thinking.."   Atorvastatin Other (See Comments)   Ciprofloxacin Nausea And Vomiting   Keppra [Levetiracetam] Nausea And Vomiting   Pioglitazone Other (See Comments)   Simvastatin Other (See Comments)    Antimicrobials this admission: cefepime 12/26 >>   Dose adjustments this admission:  Microbiology results: 12/23 UCx: ngF   Lenis Noon, PharmD 10/22/2022 6:18 AM

## 2022-10-22 NOTE — Progress Notes (Signed)
Pt still has wrist restraints in place, cpap held at this time.

## 2022-10-22 NOTE — Procedures (Signed)
Patient Name: [name]  MRN: [id number]  Epilepsy Attending: Lora Havens  Referring Physician/Provider: Donnetta Simpers, MD  Date: 10/22/2022 Duration: 23.03 mins  Patient history: 81yo M with ams. EEG to evaluate for seizure.  Level of alertness: lethargic , asleep  AEDs during EEG study: None  Technical aspects: This EEG study was done with scalp electrodes positioned according to the 10-20 International system of electrode placement. Electrical activity was reviewed with band pass filter of 1-70Hz , sensitivity of 7 uV/mm, display speed of 8mm/sec with a 60Hz  notched filter applied as appropriate. EEG data were recorded continuously and digitally stored.  Video monitoring was available and reviewed as appropriate.  Description: No clear posterior dominant rhythm was seen.  EEG showed continuous generalized 3 to 5 Hz theta-delta slowing, at times with triphasic morphology.  Sleep was characterized by sleep spindles (12 to 14 Hz), maximal frontocentral region. Hyperventilation and photic stimulation were not performed.     ABNORMALITY - Continuous slow, generalized  IMPRESSION: This study is suggestive of moderate to severe diffuse encephalopathy, nonspecific etiology. No seizures or epileptiform discharges were seen throughout the recording.  Yousuf Ager Barbra Sarks

## 2022-10-22 NOTE — Progress Notes (Signed)
SLP Cancellation Note  Patient Details Name: HENDRIX YURKOVICH MRN: [id number] DOB: 1940-12-31   Cancelled treatment:       Reason Eval/Treat Not Completed: Other (comment);Medical issues which prohibited therapy (pt with medical decline per Dr Delanna Ahmadi note, will sign off; please reconsult if indicated)  Kathleen Lime, MS [facility] SLP [place] Office [phone] Pager [phone]  Macario Golds 10/22/2022, 1:42 PM

## 2022-10-22 NOTE — Progress Notes (Signed)
ANTICOAGULATION CONSULT NOTE - Initial Consult  Pharmacy Consult for heparin Indication: atrial fibrillation  Allergies  Allergen Reactions   Cymbalta [Duloxetine Hcl] Nausea And Vomiting and Other (See Comments)    PATIENT STATED THAT HE FELT LIKE HE "WAS GOING TO DIE" Rapid drop in blood pressure; sent him to the ER X2   Neurontin [Gabapentin] Other (See Comments)    "Makes me goofy, keeps me off balance, clouds my thinking.."   Atorvastatin Other (See Comments)   Ciprofloxacin Nausea And Vomiting   Keppra [Levetiracetam] Nausea And Vomiting   Pioglitazone Other (See Comments)   Simvastatin Other (See Comments)    Patient Measurements: Height: 6' (182.9 cm) Weight: 90.7 kg (200 lb) IBW/kg (Calculated) : 77.6 Heparin Dosing Weight: 90.7 kg  Vital Signs: Temp: 99.2 F (37.3 C) (12/26 0400) Temp Source: Axillary (12/26 0400) BP: 155/106 (12/26 0700) Pulse Rate: 102 (12/26 0700)  Labs: Recent Labs    10/20/22 0729 10/21/22 0321 10/22/22 0257  HGB  --   --  13.1  HCT  --   --  40.6  PLT  --   --  228  CREATININE 0.99 1.24 1.59*    Estimated Creatinine Clearance: 38.6 mL/min (A) (by C-G formula based on SCr of 1.59 mg/dL (H)).   Medical History: Past Medical History:  Diagnosis Date   AAA (abdominal aortic aneurysm) ([place])    a. s/p repair 2009.   Acid reflux    takes Prilosec and Protonix daily   Arthritis    BacK   CAD in native artery    a. a. prior inf MI 1993. b. PCI to LAD 05/1997. c. recurrent inferolateral MI complicated by V. fib arrest in 04/1998, prior PCI to OM1. d. 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   unknown   amiodarone (PACERONE) 200 MG tablet TAKE 1 TABLET BY MOUTH AT BEDTIME 90 tablet 1 10/17/2022   amitriptyline (ELAVIL) 25 MG tablet Take 25 mg by mouth at bedtime.   10/17/2022   atorvastatin (LIPITOR) 10 MG tablet Take 10 mg by mouth in the morning.   10/22/2022   Cholecalciferol (VITAMIN  D3) 50 MCG (2000 UT) TABS Take 2,000 Units by mouth daily.   10/05/2022   Coenzyme Q10 (COQ10) 200 MG CAPS Take 200 mg by mouth in the morning.   10/20/2022   Cyanocobalamin (B-12) 5000 MCG CAPS Take 5,000 mcg by mouth daily. 30 capsule 0 10/03/2022   famotidine (PEPCID) 20 MG tablet Take 20 mg by mouth daily as needed for heartburn or indigestion.   unknown   finasteride (PROPECIA) 1 MG tablet Take 1 mg by mouth daily.   10/01/2022   furosemide (LASIX) 40 MG tablet Take 1 tablet (40 mg total) by mouth daily as needed (fluid retention/edema). (Patient taking differently: Take 40 mg by mouth daily.)   10/12/2022   JARDIANCE 25 MG TABS tablet Take 25 mg by mouth every evening.   10/17/2022   lactose free nutrition (BOOST) LIQD Take 237 mLs by mouth in the morning.   10/04/2022   levothyroxine (SYNTHROID) 112 MCG tablet Take 112 mcg by mouth every morning.   09/27/2022   Lidocaine HCl 4 %  CREA Apply 1 application topically 4 (four) times daily as needed (pain.).   unknown   metFORMIN (GLUCOPHAGE-XR) 500 MG 24 hr tablet Take 1,000 mg by mouth 2 (two) times daily with a meal.   10/06/2022   metoprolol tartrate (LOPRESSOR) 25 MG tablet Take 1 tablet (25 mg total) by mouth 2 (two) times daily. 180 tablet 3 10/24/2022 at 0930   Multiple Vitamin (MULTIVITAMIN WITH MINERALS) TABS tablet Take 1 tablet by mouth daily.   10/02/2022   oxyCODONE-acetaminophen (PERCOCET) 10-325 MG tablet Take 1 tablet by mouth 3 (three) times daily as needed for pain.   unknown   pregabalin (LYRICA) 300 MG capsule Take 300 mg by mouth 2 (two) times daily.   10/05/2022   rivaroxaban (XARELTO) 20 MG TABS tablet Take 1 tablet (20 mg total) by mouth every evening. OK to restart this medication on 01/01/2022 30 tablet 0 10/17/2022 at 2030   senna-docusate (SENOKOT-S) 8.6-50 MG tablet Take 1 tablet by mouth daily as needed (constipation.).   unknown   SOLIQUA 100-33 UNT-MCG/ML SOPN Inject 22 Units into the skin at bedtime.   10/17/2022   traZODone (DESYREL) 50 MG tablet Take 100 mg by mouth at bedtime.   10/17/2022   ACCU-CHEK SMARTVIEW test strip CHECK 1 TO 2 TIMES A DAY      glimepiride (AMARYL) 2 MG tablet Take 3 tablets (6 mg total) by mouth daily with breakfast. (Patient not taking: Reported on 10/01/2022) 90 tablet 0 Completed Course   mirtazapine (REMERON) 15 MG tablet Take 1 tablet (15 mg total) by mouth at bedtime. (Patient not taking: Reported on 10/01/2022) 30 tablet 0 Completed Course   ondansetron (ZOFRAN ODT) 4 MG disintegrating tablet Take 1 tablet (4 mg total) by mouth every 8 (eight) hours as needed for nausea or vomiting. (Patient not taking: Reported on 05/08/2022) 20 tablet 0 Not Taking   RELION PEN NEEDLES 32G X 4 MM MISC        Assessment: 81 yo M on Xarelto 20 qday for Afib.  Currently unable to take PO meds due to altered mental status. Pharmacy consulted to dose heparin.  Xarelto 20 mg last dose  12/23 @ 1821  CBC WNL, SCr up to 1.59 today (0.99 on admit)  No bleeding reported.  Goal of Therapy:  Heparin level 0.3-0.7 units/ml aPTT 66-102 seconds Monitor platelets by anticoagulation protocol: Yes   Plan:  Get baseline heparin level & aPTT now  (results  to determine if heparin will be adjusted with heparin levels vs aPTTs) Heparin 4000 unit bolus IV x 1  Heparin drip 1250 units/hr Check 8 hour aPTT/heparin level Daily CBC & heparin level while on heparin drip  Eudelia Bunch, Pharm.D Use secure chat for questions 10/22/2022 7:41 AM

## 2022-10-23 DIAGNOSIS — G9341 Metabolic encephalopathy: Secondary | ICD-10-CM | POA: Diagnosis not present

## 2022-10-23 LAB — CBC
HCT: 37.2 % — ABNORMAL LOW (ref 39.0–52.0)
Hemoglobin: 12 g/dL — ABNORMAL LOW (ref 13.0–17.0)
MCH: 31.9 pg (ref 26.0–34.0)
MCHC: 32.3 g/dL (ref 30.0–36.0)
MCV: 98.9 fL (ref 80.0–100.0)
Platelets: 181 10*3/uL (ref 150–400)
RBC: 3.76 MIL/uL — ABNORMAL LOW (ref 4.22–5.81)
RDW: 16.3 % — ABNORMAL HIGH (ref 11.5–15.5)
WBC: 18.3 10*3/uL — ABNORMAL HIGH (ref 4.0–10.5)
nRBC: 0 % (ref 0.0–0.2)

## 2022-10-23 LAB — BASIC METABOLIC PANEL
Anion gap: 10 (ref 5–15)
Anion gap: 4 — ABNORMAL LOW (ref 5–15)
BUN: 24 mg/dL — ABNORMAL HIGH (ref 8–23)
BUN: 26 mg/dL — ABNORMAL HIGH (ref 8–23)
CO2: 26 mmol/L (ref 22–32)
CO2: 30 mmol/L (ref 22–32)
Calcium: 8.9 mg/dL (ref 8.9–10.3)
Calcium: 9 mg/dL (ref 8.9–10.3)
Chloride: 116 mmol/L — ABNORMAL HIGH (ref 98–111)
Chloride: 117 mmol/L — ABNORMAL HIGH (ref 98–111)
Creatinine, Ser: 0.94 mg/dL (ref 0.61–1.24)
Creatinine, Ser: 1.01 mg/dL (ref 0.61–1.24)
GFR, Estimated: >60 mL/min (ref >60)
GFR, Estimated: >60 mL/min (ref >60)
Glucose, Bld: 168 mg/dL — ABNORMAL HIGH (ref 70–99)
Glucose, Bld: 209 mg/dL — ABNORMAL HIGH (ref 70–99)
Potassium: 3.5 mmol/L (ref 3.5–5.1)
Potassium: 4 mmol/L (ref 3.5–5.1)
Sodium: 151 mmol/L — ABNORMAL HIGH (ref 135–145)
Sodium: 152 mmol/L — ABNORMAL HIGH (ref 135–145)

## 2022-10-23 LAB — GLUCOSE, CAPILLARY
Glucose-Capillary: 184 mg/dL — ABNORMAL HIGH (ref 70–99)
Glucose-Capillary: 204 mg/dL — ABNORMAL HIGH (ref 70–99)
Glucose-Capillary: 203 mg/dL — ABNORMAL HIGH (ref 70–99)
Glucose-Capillary: 180 mg/dL — ABNORMAL HIGH (ref 70–99)
Glucose-Capillary: 178 mg/dL — ABNORMAL HIGH (ref 70–99)
Glucose-Capillary: 228 mg/dL — ABNORMAL HIGH (ref 70–99)
Glucose-Capillary: 184 mg/dL — ABNORMAL HIGH (ref 70–99)
Glucose-Capillary: 148 mg/dL — ABNORMAL HIGH (ref 70–99)
Glucose-Capillary: 171 mg/dL — ABNORMAL HIGH (ref 70–99)
Glucose-Capillary: 193 mg/dL — ABNORMAL HIGH (ref 70–99)

## 2022-10-23 LAB — HEPARIN LEVEL (UNFRACTIONATED)
Heparin Unfractionated: 0.15 IU/mL — ABNORMAL LOW (ref 0.30–0.70)
Heparin Unfractionated: 0.37 IU/mL (ref 0.30–0.70)

## 2022-10-23 LAB — BETA-HYDROXYBUTYRIC ACID
Beta-Hydroxybutyric Acid: 0.13 mmol/L (ref 0.05–0.27)
Beta-Hydroxybutyric Acid: 0.83 mmol/L — ABNORMAL HIGH (ref 0.05–0.27)

## 2022-10-23 MED ORDER — ACETAMINOPHEN 10 MG/ML IV SOLN
1000.0000 mg | Freq: Once | INTRAVENOUS | Status: AC
  Administered 2022-10-23: 1000 mg via INTRAVENOUS
  Filled 2022-10-23: qty 100

## 2022-10-23 MED ORDER — LORAZEPAM 2 MG/ML IJ SOLN
2.0000 mg | INTRAMUSCULAR | Status: DC | PRN
  Administered 2022-10-24: 2 mg via INTRAVENOUS
  Filled 2022-10-23: qty 1

## 2022-10-23 MED ORDER — HEPARIN BOLUS VIA INFUSION
2500.0000 [IU] | Freq: Once | INTRAVENOUS | Status: AC
  Administered 2022-10-23: 2500 [IU] via INTRAVENOUS
  Filled 2022-10-23: qty 2500

## 2022-10-23 MED ORDER — INSULIN GLARGINE-YFGN 100 UNIT/ML ~~LOC~~ SOLN
10.0000 [IU] | Freq: Once | SUBCUTANEOUS | Status: AC
  Administered 2022-10-23: 10 [IU] via SUBCUTANEOUS
  Filled 2022-10-23: qty 0.1

## 2022-10-23 MED ORDER — INSULIN ASPART 100 UNIT/ML IJ SOLN
0.0000 [IU] | INTRAMUSCULAR | Status: DC
  Administered 2022-10-23: 1 [IU] via SUBCUTANEOUS
  Administered 2022-10-23: 2 [IU] via SUBCUTANEOUS

## 2022-10-23 MED ORDER — GLYCOPYRROLATE 0.2 MG/ML IJ SOLN
0.1000 mg | Freq: Three times a day (TID) | INTRAMUSCULAR | Status: DC
  Administered 2022-10-23 (×2): 0.1 mg via INTRAVENOUS
  Filled 2022-10-23 (×2): qty 1

## 2022-10-23 MED ORDER — SODIUM CHLORIDE 0.9 % IV SOLN
2.0000 g | Freq: Three times a day (TID) | INTRAVENOUS | Status: DC
  Administered 2022-10-23: 2 g via INTRAVENOUS

## 2022-10-23 NOTE — Progress Notes (Signed)
ANTICOAGULATION CONSULT NOTE - follow up  Pharmacy Consult for heparin Indication: atrial fibrillation  Allergies  Allergen Reactions   Cymbalta [Duloxetine Hcl] Nausea And Vomiting and Other (See Comments)    PATIENT STATED THAT HE FELT LIKE HE "WAS GOING TO DIE" Rapid drop in blood pressure; sent him to the ER X2   Neurontin [Gabapentin] Other (See Comments)    "Makes me goofy, keeps me off balance, clouds my thinking.."   Atorvastatin Other (See Comments)   Ciprofloxacin Nausea And Vomiting   Keppra [Levetiracetam] Nausea And Vomiting   Pioglitazone Other (See Comments)   Simvastatin Other (See Comments)    Patient Measurements: Height: 6' (182.9 cm) Weight: 90.7 kg (200 lb) IBW/kg (Calculated) : 77.6 Heparin Dosing Weight: 90.7 kg  Vital Signs: Temp: 101 F (38.3 C) (12/27 1200) Temp Source: Axillary (12/27 1200) BP: 127/53 (12/27 1300) Pulse Rate: 123 (12/27 1300)  Labs: Recent Labs    10/22/22 0257 10/22/22 0257 10/22/22 0737 10/22/22 1226 10/22/22 1901 10/23/22 0014 10/23/22 0353 10/23/22 1217  HGB 13.1  --   --   --   --   --  12.0*  --   HCT 40.6  --   --   --   --   --  37.2*  --   PLT 228  --   --   --   --   --  181  --   APTT  --   --  21*  --   --   --   --   --   HEPARINUNFRC  --    < > <0.10*  --  0.26*  --  0.15* 0.37  CREATININE 1.59*  --  1.55*   < > 1.19 1.01 0.94  --    < > = values in this interval not displayed.     Estimated Creatinine Clearance: 65.4 mL/min (by C-G formula based on SCr of 0.94 mg/dL).   Assessment: 81 yo M on Xarelto 20 qday for Afib.  Currently unable to take PO meds due to altered mental status. Pharmacy consulted to dose heparin.  Xarelto 20 mg last dose 12/23 @ 1821   10/23/2022 HL 0.37 - therapeutic after 2500 unit bolus and rate increase to 1650 units/hr Hgb 12, plts 181 Per RN no interruptions and no bleeding noted  Goal of Therapy:  Heparin level 0.3-0.7 units/ml aPTT 66-102 seconds Monitor platelets  by anticoagulation protocol: Yes   Plan:  continue heparin drip @ 1650 units/hr Daily CBC & heparin level while on heparin drip   Eudelia Bunch, Pharm.D Use secure chat for questions 10/23/2022 1:26 PM

## 2022-10-23 NOTE — Progress Notes (Signed)
ANTICOAGULATION CONSULT NOTE - follow up  Pharmacy Consult for heparin Indication: atrial fibrillation  Allergies  Allergen Reactions   Cymbalta [Duloxetine Hcl] Nausea And Vomiting and Other (See Comments)    PATIENT STATED THAT HE FELT LIKE HE "WAS GOING TO DIE" Rapid drop in blood pressure; sent him to the ER X2   Neurontin [Gabapentin] Other (See Comments)    "Makes me goofy, keeps me off balance, clouds my thinking.."   Atorvastatin Other (See Comments)   Ciprofloxacin Nausea And Vomiting   Keppra [Levetiracetam] Nausea And Vomiting   Pioglitazone Other (See Comments)   Simvastatin Other (See Comments)    Patient Measurements: Height: 6' (182.9 cm) Weight: 90.7 kg (200 lb) IBW/kg (Calculated) : 77.6 Heparin Dosing Weight: 90.7 kg  Vital Signs: Temp: 97.4 F (36.3 C) (12/27 0329) Temp Source: Axillary (12/27 0329) BP: 106/43 (12/27 0400) Pulse Rate: 119 (12/27 0400)  Labs: Recent Labs    10/22/22 0257 10/22/22 0737 10/22/22 1226 10/22/22 1432 10/22/22 1901 10/23/22 0014 10/23/22 0353  HGB 13.1  --   --   --   --   --  12.0*  HCT 40.6  --   --   --   --   --  37.2*  PLT 228  --   --   --   --   --  181  APTT  --  21*  --   --   --   --   --   HEPARINUNFRC  --  <0.10*  --   --  0.26*  --  0.15*  CREATININE 1.59* 1.55*   < > 1.38* 1.19 1.01  --    < > = values in this interval not displayed.     Estimated Creatinine Clearance: 60.8 mL/min (by C-G formula based on SCr of 1.01 mg/dL).   Medical History: Past Medical History:  Diagnosis Date   AAA (abdominal aortic aneurysm) ([place])    a. s/p repair 2009.   Acid reflux    takes Prilosec and Protonix daily   Arthritis    BacK   CAD in native artery    a. a. prior inf MI 1993. b. PCI to LAD 05/1997. c. recurrent inferolateral MI complicated by V. fib arrest in 04/1998, prior PCI to OM1. d. 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   unknown   amiodarone (PACERONE) 200 MG tablet TAKE 1 TABLET BY MOUTH AT BEDTIME 90 tablet 1 10/17/2022   amitriptyline (ELAVIL) 25 MG tablet Take 25 mg by mouth at bedtime.   10/17/2022   atorvastatin (LIPITOR) 10 MG tablet Take 10 mg by mouth in the morning.   10/15/2022   Cholecalciferol (VITAMIN D3) 50 MCG (2000 UT) TABS Take 2,000 Units by mouth daily.   10/10/2022   Coenzyme Q10 (COQ10) 200 MG CAPS Take 200 mg by mouth in the morning.   10/13/2022   Cyanocobalamin (B-12) 5000 MCG CAPS Take 5,000 mcg by mouth daily. 30 capsule 0 09/30/2022   famotidine (PEPCID) 20 MG tablet Take 20 mg by mouth daily as needed for heartburn or indigestion.   unknown   finasteride (PROPECIA) 1 MG tablet Take 1 mg by mouth daily.   09/29/2022   furosemide (LASIX) 40 MG tablet Take 1 tablet (40 mg total) by mouth daily as needed (fluid retention/edema). (Patient taking differently: Take 40 mg by mouth daily.)   10/06/2022   JARDIANCE 25  MG TABS tablet Take 25 mg by mouth every evening.   10/17/2022   lactose free nutrition (BOOST) LIQD Take 237 mLs by mouth in the morning.   10/27/2022   levothyroxine (SYNTHROID) 112 MCG tablet Take 112 mcg by mouth every morning.   10/02/2022   Lidocaine HCl 4 % CREA Apply 1 application topically 4 (four) times daily as needed (pain.).   unknown   metFORMIN (GLUCOPHAGE-XR) 500 MG 24 hr tablet Take 1,000 mg by mouth 2 (two) times daily with a meal.   10/13/2022   metoprolol tartrate (LOPRESSOR) 25 MG tablet Take 1 tablet (25 mg total) by mouth 2 (two) times daily. 180 tablet 3 10/24/2022 at 0930   Multiple Vitamin (MULTIVITAMIN WITH MINERALS) TABS tablet Take 1 tablet by mouth daily.   10/04/2022   oxyCODONE-acetaminophen (PERCOCET) 10-325 MG tablet Take 1 tablet by mouth 3 (three) times daily as needed for pain.   unknown   pregabalin (LYRICA) 300 MG capsule Take 300 mg by mouth 2 (two) times daily.   10/14/2022   rivaroxaban (XARELTO) 20 MG TABS tablet Take 1 tablet (20 mg total) by mouth every evening. OK to restart this medication on 01/01/2022 30 tablet 0 10/17/2022 at 2030   senna-docusate (SENOKOT-S) 8.6-50 MG tablet Take 1 tablet by mouth daily as needed (constipation.).   unknown   SOLIQUA 100-33 UNT-MCG/ML SOPN Inject 22 Units into the skin at bedtime.   10/17/2022   traZODone (DESYREL) 50 MG tablet Take 100 mg by mouth at bedtime.   10/17/2022   ACCU-CHEK SMARTVIEW test strip CHECK 1 TO 2 TIMES A DAY      glimepiride (AMARYL) 2 MG tablet Take 3 tablets (6 mg total) by mouth daily with breakfast. (Patient not taking: Reported on 10/10/2022) 90 tablet 0 Completed Course   mirtazapine (REMERON) 15 MG tablet Take 1 tablet (15 mg total) by mouth at bedtime. (Patient not taking: Reported on 10/06/2022) 30 tablet 0 Completed Course   ondansetron (ZOFRAN ODT) 4 MG disintegrating tablet Take 1 tablet (4 mg total) by mouth every 8 (eight) hours as needed for nausea or vomiting. (Patient not taking:  Reported on 05/08/2022) 20 tablet 0 Not Taking   RELION PEN NEEDLES  32G X 4 MM MISC        Assessment: 81 yo M on Xarelto 20 qday for Afib.  Currently unable to take PO meds due to altered mental status. Pharmacy consulted to dose heparin.  Xarelto 20 mg last dose 12/23 @ 1821   10/23/2022 HL 0.15 subtherapeutic on 1350 units/hr Hgb 12, plts 181 Per RN no interruptions and no bleeding noted  Goal of Therapy:  Heparin level 0.3-0.7 units/ml aPTT 66-102 seconds Monitor platelets by anticoagulation protocol: Yes   Plan:  Heparin 2500 unit bolus IV x 1  Increase heparin drip to 1650 units/hr Check 8 hour heparin level Daily CBC & heparin level while on heparin drip  Dolly Rias RPh 10/23/2022, 4:27 AM

## 2022-10-23 NOTE — Progress Notes (Signed)
Brief Nutrition Note  Received consult for enteral nutrition initiation and management. Per chart review, patient being followed by palliative care. Note Palliative Care yesterday indicates patient made DNR with limited interventions and is likely transition to comfort measures. Recommendations from this note also include "no artifical feeding".  Will sign off at this time. If plan of care changes or nutrition interventions arise, please reconsult.    Samson Frederic RD, LDN For contact information, refer to [facility].

## 2022-10-23 NOTE — Progress Notes (Signed)
Met with patient's family. 3 daughters, his wife and brother-in-law. Plan is to transition to full comfort care once his 4th daughter arrives from [place] late this evening. Will will focus on getting him weaned off NRB while we titrate his opioid infusion and also try to get him weaned off his Cardizem infusion. He is currently still requiring a very high level of medical and nursing care to achieve comfort.Will continue to titrate his opioid infusion and sedation for symptoms at EOL. Anticipate a hospital death -hours to couple of days most likely. He is too unstable and symptomatic for transport. Family agree with comfort care plan and transition.  Recommendations: Will discontinue lab and CBGs. Maintain Cardizem until daughter arrives this evening then D/C DC Heparin Fluid at [place] for secretions-Oral-pharyngeal suctioning Discontinue abx Wean off full face mask O2 while titrating opioid infusion for comfort.  Lane Hacker, DO Palliative Medicine  Time: 60 minutes

## 2022-10-23 NOTE — Progress Notes (Signed)
PROGRESS NOTE    [name]  NLG:[id number] DOB: Sep 02, 1941 DOA: 10/24/2022 PCP: Kathalene Frames, MD   [name] is a 81 y.o. 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 Of note on last fall he hit his head and was unable to get up and was immobilized  on floor overnight , an unknown period of time. Family in am called EMS. He was noted in field to have slurred speech. Per wife he had had progressive decline over the last 2 weeks at home. She notes at baseline he has clear speech but is at baseline confused.  "   Hospital course significant for worsening encephalopathy, hypoxia, respiratory failure, fevers likely aspiration and agitation -Seen by palliative care, slowly transitioning to comfort measures  Subjective: Remains encephalopathic, febrile, obtunded, hypoxic   Assessment and Plan:  Acute metabolic encephalopathy Suspect due to polypharmacy w. Delirium, aspiration pneumonia is also possibility.  In the background of dementia -Seen by neurology on admission, polypharmacy suspected 12/24: more alert but confused and agitated, required restraints and transfer to ICU. 12/25--26: sedated, no improvement,  -Hold sedating meds (Lyrica, opioid, trazodone, Elavil) -TSH is normal, B12 normal, Folate normal, Thiamine levels pending. , EEG with diffuse slowing -MRI negative for acute findings, atrophy noted -Palliative care following, started and transition pressures soon -Topical lidocaine to help neuropathic pain -was started on Ativan per CIWA for withdrawal -lyrica/elavil -Now actively declining, anticipate hospital demise in 24 hours  Sepsis Acute hypoxic respiratory failure Suspected aspiration pneumonia, fevers -Transitioning to comfort  care, antibiotics discontinued  Hyperglycemia, metabolic acidosis -Do not suspect DKA, discontinue insulin drip  Acute urinary retention Required in/out cath 12/23. Bladder scans and in/out PRN  Recurrent falls Suspect due to polypharmacy. See acute metabolic encephalopathy.  Chronic combined systolic and diastolic heart failure (HCC) Euvolemic to dry on exam. Continue metoprolol and PRN Lasix  Prolonged QT interval Hold amiodarone.  D/C Haldol PRN. 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  Ischemic cardiomyopathy Chronic systolic CHF, EF 08%  PAF (paroxysmal atrial fibrillation) (HCC) Unable to take PO Xarelto, metoprolol, amiodarone  -Currently on IV   Acquired thrombophilia (HCC) Anticoagulated with Xarelto for A-fib  AKI (acute kidney injury) ([place]) Due to prolonged encephalopathy and NPO   Lumbar radiculopathy, chronic  Type 2 diabetes mellitus with diabetic neuropathy (HCC) Sliding scale coverage Hold home meds (Ozempic, Jardiance, metformin)  Sleep apnea CPAP  Hypertension IV metoprolol PRN until taking PO   DVT prophylaxis: Comfort care soon Code Status: DNR Family Communication: No family at bedside, will update wife later today Disposition Plan: Transfer out of ICU when comfort measures initiated  Consultants: Palliative care   Procedures:   Antimicrobials:    Objective: Vitals:   10/23/22 0400 10/23/22 0430 10/23/22 0500 10/23/22 0530  BP: (!) 106/43 (!) 89/59 (!) 98/58 (!) 113/94  Pulse: (!) 119 (!) 136 (!) 130 (!) 137  Resp: (!) 27 (!) 28 (!) 24 (!) 25  Temp:  (!) 96.9 F (36.1 C)    TempSrc:  Axillary    SpO2: (!) 85% (!) 84% 94% 95%  Weight:      Height:  Intake/Output Summary (Last 24 hours) at 10/23/2022 0539 Last data filed at 10/23/2022 0435 Gross per 24 hour  Intake 6943.99 ml  Output 950 ml  Net 5993.99  ml   Filed Weights   10/09/2022 1419  Weight: 90.7 kg    Examination:  Chronically ill male laying in bed, obtunded, moans and groans, Ventimask on HEENT: No JVD CVS: S1-S2, irregularly irregular rhythm Lungs: Diffuse scattered rhonchi Abdomen: Soft, nontender, bowel sounds present Extremities: No edema    Data Reviewed:   CBC: Recent Labs  Lab 10/20/2022 1430 10/19/22 0626 10/22/22 0257 10/23/22 0353  WBC 7.0 5.6 24.1* 18.3*  HGB 12.7* 11.9* 13.1 12.0*  HCT 38.0* 35.2* 40.6 37.2*  MCV 96.9 95.7 99.3 98.9  PLT 166 160 228 174   Basic Metabolic Panel: Recent Labs  Lab 10/20/22 0729 10/21/22 0321 10/22/22 0257 10/22/22 0737 10/22/22 1226 10/22/22 1432 10/22/22 1901 10/23/22 0014 10/23/22 0353  NA 137   < > 151*   < > 152* 151* 151* 152* 151*  K 3.8   < > 3.7   < > 3.4* 3.0* 3.5 4.0 3.5  CL 104   < > 115*   < > 117* 117* 117* 116* 117*  CO2 23   < > 15*   < > 18* 23 28 26 30   GLUCOSE 168*   < > 235*   < > 227* 188* 197* 168* 209*  BUN 29*   < > 39*   < > 39* 34* 32* 26* 24*  CREATININE 0.99   < > 1.59*   < > 1.49* 1.38* 1.19 1.01 0.94  CALCIUM 9.3   < > 9.4   < > 9.4 9.0 9.1 9.0 8.9  MG 2.1  --  2.3  --   --   --   --   --   --   PHOS  --   --  4.9*  --   --   --   --   --   --    < > = values in this interval not displayed.   GFR: Estimated Creatinine Clearance: 65.4 mL/min (by C-G formula based on SCr of 0.94 mg/dL). Liver Function Tests: Recent Labs  Lab 10/19/22 0626 10/22/22 0257  AST 12* 21  ALT 15 26  ALKPHOS 80 85  BILITOT 0.9 1.7*  PROT 7.1 7.2  ALBUMIN 3.4* 3.5   No results for input(s): "LIPASE", "AMYLASE" in the last 168 hours. Recent Labs  Lab 10/21/22 1201  AMMONIA 18   Coagulation Profile: Recent Labs  Lab 10/15/2022 1430  INR 1.9*   Cardiac Enzymes: Recent Labs  Lab 10/11/2022 1430  CKTOTAL 49   BNP (last 3 results) No results for input(s): "PROBNP" in the last 8760 hours. HbA1C: No results for input(s): "HGBA1C" in the  last 72 hours. CBG: Recent Labs  Lab 10/22/22 2354 10/23/22 0156 10/23/22 0259 10/23/22 0431 10/23/22 0535  GLUCAP 174* 184* 204* 203* 180*   Lipid Profile: No results for input(s): "CHOL", "HDL", "LDLCALC", "TRIG", "CHOLHDL", "LDLDIRECT" in the last 72 hours. Thyroid Function Tests: No results for input(s): "TSH", "T4TOTAL", "FREET4", "T3FREE", "THYROIDAB" in the last 72 hours. Anemia Panel: No results for input(s): "VITAMINB12", "FOLATE", "FERRITIN", "TIBC", "IRON", "RETICCTPCT" in the last 72 hours. Urine analysis:    Component Value Date/Time   COLORURINE YELLOW 10/21/2022 [place] 10/21/2022 1416   LABSPEC 1.020 10/21/2022 1416   PHURINE 5.5 10/21/2022 1416   GLUCOSEU >=1000 (A) 10/21/2022 1416  HGBUR SMALL (A) 10/21/2022 1416   BILIRUBINUR MODERATE (A) 10/21/2022 1416   KETONESUR >=80 (A) 10/21/2022 1416   PROTEINUR TRACE (A) 10/21/2022 1416   UROBILINOGEN 0.2 12/12/2014 0150   NITRITE NEGATIVE 10/21/2022 1416   LEUKOCYTESUR NEGATIVE 10/21/2022 1416   Sepsis Labs: @LABRCNTIP (procalcitonin:4,lacticidven:4)  ) Recent Results (from the past 240 hour(s))  Urine Culture     Status: None   Collection Time: 10/19/22  6:01 AM   Specimen: Urine, Clean Catch  Result Value Ref Range Status   Specimen Description   Final    URINE, CLEAN CATCH Performed at [facility], [place] [address], [place], [place] [address]    Special Requests   Final    NONE Performed at [facility], [place] [address], [place], [place] [address]    Culture   Final    NO GROWTH Performed at [facility], [place] [address] [place], [place] [address]    Report Status 10/20/2022 FINAL  Final  Resp panel by RT-PCR (RSV, Flu A&B, Covid) Anterior Nasal Swab     Status: None   Collection Time: 10/19/22 11:47 AM   Specimen: Anterior Nasal Swab  Result Value Ref Range Status   SARS Coronavirus 2 by RT PCR NEGATIVE NEGATIVE Final    Comment:  (NOTE) SARS-CoV-2 target nucleic acids are NOT DETECTED.  The SARS-CoV-2 RNA is generally detectable in upper respiratory specimens during the acute phase of infection. The lowest concentration of SARS-CoV-2 viral copies this assay can detect is 138 copies/mL. A negative result does not preclude SARS-Cov-2 infection and should not be used as the sole basis for treatment or other patient management decisions. A negative result may occur with  improper specimen collection/handling, submission of specimen other than nasopharyngeal swab, presence of viral mutation(s) within the areas targeted by this assay, and inadequate number of viral copies(<138 copies/mL). A negative result must be combined with clinical observations, patient history, and epidemiological information. The expected result is Negative.  Fact Sheet for Patients:  EntrepreneurPulse.com.au  Fact Sheet for Healthcare Providers:  IncredibleEmployment.be  This test is no t yet approved or cleared by the [place] FDA and  has been authorized for detection and/or diagnosis of SARS-CoV-2 by FDA under an Emergency Use Authorization (EUA). This EUA will remain  in effect (meaning this test can be used) for the duration of the COVID-19 declaration under Section 564(b)(1) of the Act, 21 U.S.C.section 360bbb-3(b)(1), unless the authorization is terminated  or revoked sooner.       Influenza A by PCR NEGATIVE NEGATIVE Final   Influenza B by PCR NEGATIVE NEGATIVE Final    Comment: (NOTE) The Xpert Xpress SARS-CoV-2/FLU/RSV plus assay is intended as an aid in the diagnosis of influenza from Nasopharyngeal swab specimens and should not be used as a sole basis for treatment. Nasal washings and aspirates are unacceptable for Xpert Xpress SARS-CoV-2/FLU/RSV testing.  Fact Sheet for Patients: EntrepreneurPulse.com.au  Fact Sheet for Healthcare  Providers: IncredibleEmployment.be  This test is not yet approved or cleared by the [place] FDA and has been authorized for detection and/or diagnosis of SARS-CoV-2 by FDA under an Emergency Use Authorization (EUA). This EUA will remain in effect (meaning this test can be used) for the duration of the COVID-19 declaration under Section 564(b)(1) of the Act, 21 U.S.C. section 360bbb-3(b)(1), unless the authorization is terminated or revoked.     Resp Syncytial Virus by PCR NEGATIVE NEGATIVE Final    Comment: (NOTE) Fact Sheet for  Patients: EntrepreneurPulse.com.au  Fact Sheet for Healthcare Providers: IncredibleEmployment.be  This test is not yet approved or cleared by the [place] FDA and has been authorized for detection and/or diagnosis of SARS-CoV-2 by FDA under an Emergency Use Authorization (EUA). This EUA will remain in effect (meaning this test can be used) for the duration of the COVID-19 declaration under Section 564(b)(1) of the Act, 21 U.S.C. section 360bbb-3(b)(1), unless the authorization is terminated or revoked.  Performed at [facility], [place] [address] [place], [place] [address]      Radiology Studies: EEG adult  Result Date: 11/08/2022 Lora Havens, MD     2022/11/08 12:29 PM Patient Name: CORDON GASSETT MRN: [id number] Epilepsy Attending: Lora Havens Referring Physician/Provider: Donnetta Simpers, MD Date: 2022/11/08 Duration: 23.03 mins Patient history: 81yo M with ams. EEG to evaluate for seizure. Level of alertness: lethargic , asleep AEDs during EEG study: None Technical aspects: This EEG study was done with scalp electrodes positioned according to the 10-20 International system of electrode placement. Electrical activity was reviewed with band pass filter of 1-70Hz , sensitivity of 7 uV/mm, display speed of 76mm/sec with a 60Hz  notched filter applied as appropriate.  EEG data were recorded continuously and digitally stored.  Video monitoring was available and reviewed as appropriate. Description: No clear posterior dominant rhythm was seen.  EEG showed continuous generalized 3 to 5 Hz theta-delta slowing, at times with triphasic morphology.  Sleep was characterized by sleep spindles (12 to 14 Hz), maximal frontocentral region. Hyperventilation and photic stimulation were not performed.   ABNORMALITY - Continuous slow, generalized IMPRESSION: This study is suggestive of moderate to severe diffuse encephalopathy, nonspecific etiology. No seizures or epileptiform discharges were seen throughout the recording. Lora Havens   DG Chest 1 View  Result Date: 10/21/2022 CLINICAL DATA:  Respiratory failure, hypoxia EXAM: CHEST  1 VIEW COMPARISON:  Previous studies including the examination of 10/24/2022 FINDINGS: Transverse diameter of heart is slightly increased. There are patchy densities in left upper and left lower lung fields with slight improvement in aeration. There are no new infiltrates. Right lung is clear. Small left pleural effusion is seen. There is no pneumothorax. IMPRESSION: There are patchy infiltrates in left upper and left lower lung fields with slight improvement. Residual atelectasis/pneumonia is seen in left lung. Small left pleural effusion. No new focal infiltrates are seen. Electronically Signed   By: Elmer Picker M.D.   On: 10/21/2022 10:42     Scheduled Meds:  Chlorhexidine Gluconate Cloth  6 each Topical Daily   diazepam  5 mg Intravenous Q4H    HYDROmorphone (DILAUDID) injection  0.5 mg Intravenous Q4H   levothyroxine  112 mcg Oral q morning   lidocaine   Topical TID   metoprolol tartrate  25 mg Oral BID   mouth rinse  15 mL Mouth Rinse 4 times per day   senna-docusate  2 tablet Oral QHS   Continuous Infusions:  ceFEPime (MAXIPIME) IV Stopped (11/08/22 2145)   dextrose 5% lactated ringers with KCl 20 mEq/L 125 mL/hr at 10/23/22  0435   diltiazem (CARDIZEM) infusion 15 mg/hr (10/23/22 0435)   famotidine (PEPCID) IV Stopped (November 08, 2022 1202)   heparin 1,650 Units/hr (10/23/22 0435)   HYDROmorphone 0.5 mg/hr (10/23/22 0435)   insulin 7.5 Units/hr (10/23/22 0435)   sodium bicarbonate 150 mEq in dextrose 5 % 1,150 mL infusion 100 mL/hr at 10/23/22 0435     LOS: 5 days    Time spent: 80min  Domenic Polite, MD [facility]   10/23/2022, 5:39 AM

## 2022-10-23 NOTE — Progress Notes (Addendum)
Pharmacy Antibiotic Note  [name] is a 81 y.o. male admitted on 10/22/2022 with sepsis.  Pharmacy has been consulted for cefepime dosing.  Today, 10/23/22 SCr down to 0.94  Tmax 101, since then low temps - down to 96.9 WBC 24.1> 18.3  Plan: Increase Cefepime to 2 g IV q18h Follow renal function and culture data  Height: 6' (182.9 cm) Weight: 90.7 kg (200 lb) IBW/kg (Calculated) : 77.6  Temp (24hrs), Avg:98.6 F (37 C), Min:96.9 F (36.1 C), Max:101 F (38.3 C)  Recent Labs  Lab 10/27/2022 1430 10/19/22 0626 10/20/22 0729 10/21/22 1201 10/22/22 0257 10/22/22 0737 10/22/22 1226 10/22/22 1432 10/22/22 1901 10/23/22 0014 10/23/22 0353  WBC 7.0 5.6  --   --  24.1*  --   --   --   --   --  18.3*  CREATININE 0.99 0.89   < >  --  1.59*   < > 1.49* 1.38* 1.19 1.01 0.94  LATICACIDVEN 1.7  --   --  1.1  --   --   --   --   --   --   --    < > = values in this interval not displayed.     Estimated Creatinine Clearance: 65.4 mL/min (by C-G formula based on SCr of 0.94 mg/dL).    Allergies  Allergen Reactions   Cymbalta [Duloxetine Hcl] Nausea And Vomiting and Other (See Comments)    PATIENT STATED THAT HE FELT LIKE HE "WAS GOING TO DIE" Rapid drop in blood pressure; sent him to the ER X2   Neurontin [Gabapentin] Other (See Comments)    "Makes me goofy, keeps me off balance, clouds my thinking.."   Atorvastatin Other (See Comments)   Ciprofloxacin Nausea And Vomiting   Keppra [Levetiracetam] Nausea And Vomiting   Pioglitazone Other (See Comments)   Simvastatin Other (See Comments)    Antimicrobials this admission: cefepime 12/26 >>  Dose adjustments this admission:  Microbiology results: 12/23 UCx: ngF   Eudelia Bunch, Pharm.D Use secure chat for questions 10/23/2022 9:12 AM

## 2022-10-24 DIAGNOSIS — Z515 Encounter for palliative care: Secondary | ICD-10-CM

## 2022-10-24 MED ORDER — GLYCOPYRROLATE 0.2 MG/ML IJ SOLN
0.1000 mg | Freq: Four times a day (QID) | INTRAMUSCULAR | Status: DC
  Administered 2022-10-24 (×5): 0.1 mg via INTRAVENOUS
  Filled 2022-10-24: qty 1
  Filled 2022-10-24 (×4): qty 0.5
  Filled 2022-10-24 (×2): qty 1

## 2022-10-24 MED ORDER — ACETAMINOPHEN 10 MG/ML IV SOLN
1000.0000 mg | Freq: Once | INTRAVENOUS | Status: AC
  Administered 2022-10-24: 1000 mg via INTRAVENOUS
  Filled 2022-10-24: qty 100

## 2022-10-24 MED ORDER — LORAZEPAM 2 MG/ML IJ SOLN
2.0000 mg | INTRAMUSCULAR | Status: DC | PRN
  Administered 2022-10-24: 4 mg via INTRAVENOUS
  Filled 2022-10-24: qty 2

## 2022-10-24 MED ORDER — GLYCOPYRROLATE 0.2 MG/ML IJ SOLN: 0.1000 mg | Freq: Four times a day (QID) | INTRAMUSCULAR | Status: DC

## 2022-10-24 NOTE — Progress Notes (Signed)
Family arrived to bedside at [place] (Wife and x3 daughters).  Family updated and questions answered.  Cardizem turned off per Dr. Delanna Ahmadi instructions.  Dilaudid titrated.  O2 changed from NRB 15L to [place] 5L per family request.  Family states appreciation for care.

## 2022-10-24 NOTE — Progress Notes (Signed)
Pt seen and examined, 2 daughters at bedside, actively dying, on Dilaudid gtt. -Titrate meds for comfort, continue Comfort Care -Transfer out of ICU to palliative care unit -Anticipate hospital demise in 1 to 2 days  Domenic Polite, MD

## 2022-10-24 NOTE — Progress Notes (Addendum)
Yellow metal wedding band removed from pt's finger and given to wife in a specimen cup per wifes request.

## 2022-10-25 LAB — VITAMIN B1: Vitamin B1 (Thiamine): 165.9 nmol/L (ref 66.5–200.0)

## 2022-10-28 NOTE — Progress Notes (Signed)
Palliative Care Progress Note  Transition to comfort care yesterday evening. He is actively trasitioning. Saturations in low 70's, breathing pattern has changed. Increased secretions and shallow congested breathing. Now febrile. Now unresponsive. Moving from ICU to medical floor. 2 boluses given for transport and dyspnea. Maintain hydromorphone infusion. Titrate to low flow nasal cannula.Discussed current condition with family at bedside and prepared them for end of life transition. Gave wife number for Hospice Grief Support.  Lane Hacker, DO Palliative Medicine  35 minutes

## 2022-10-28 NOTE — Progress Notes (Signed)
    OVERNIGHT PROGRESS REPORT  Notified by RN that patient has expired at 2. 2 RN verified. Patient was comfort care.   RN reports family was present.    Raenette Rover, DNP, [place]

## 2022-10-28 DEATH — deceased
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
## 2022-11-12 ENCOUNTER — Ambulatory Visit: Payer: Medicare Other | Admitting: Internal Medicine

## 2022-11-12 ENCOUNTER — Other Ambulatory Visit: Payer: Medicare Other
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
## 2023-02-09 IMAGING — CT CT CHEST SUPER D W/O CM
1 series · 14 of 32 positions shown, 18 images · non-contrast
Comparison: CT chest 02/16/2021 and PET-CT from 07/05/2021

CLINICAL DATA: Superior segment left lower lobe lung mass

EXAM:
CT CHEST WITHOUT CONTRAST
TECHNIQUE: Multidetector CT imaging of the chest was performed using thin slice
collimation for electromagnetic bronchoscopy planning purposes,
without intravenous contrast.

[Series 9: chest without · axial · non-contrast · 0.81mm/px · z∈[+13,+318]mm · 14 of 73 slices shown, 18 images]
[im 6/73  mediastinal]
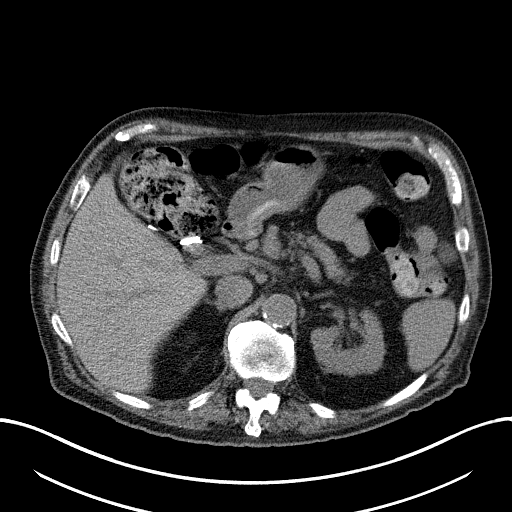
[im 6/73  lung]
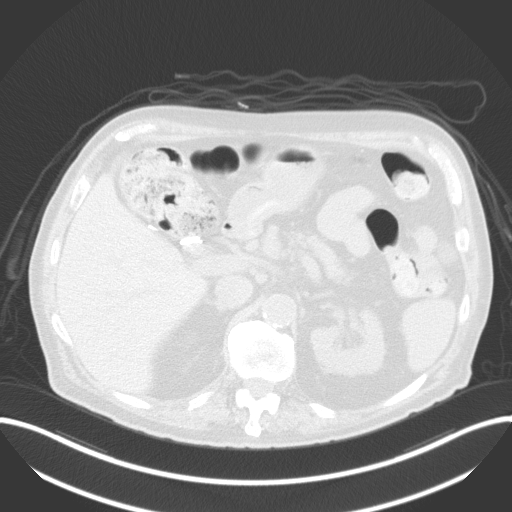
[im 11/73  lung]
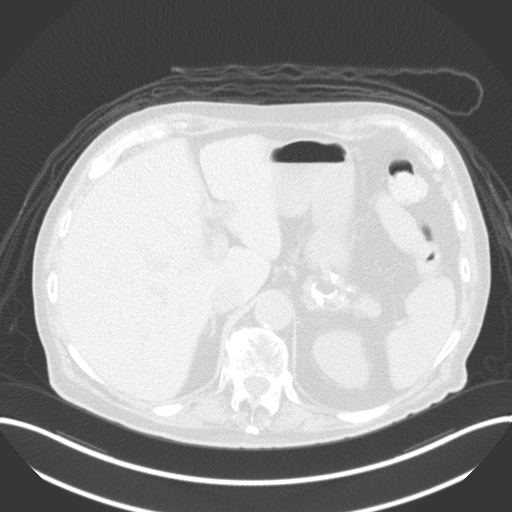
[im 15/73  lung]
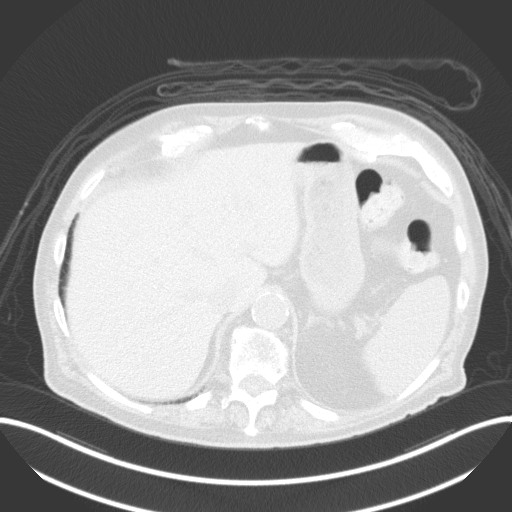
[im 19/73  lung]
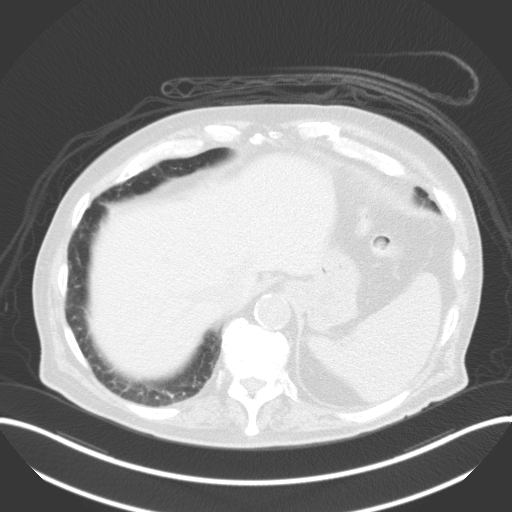
[im 25/73  mediastinal]
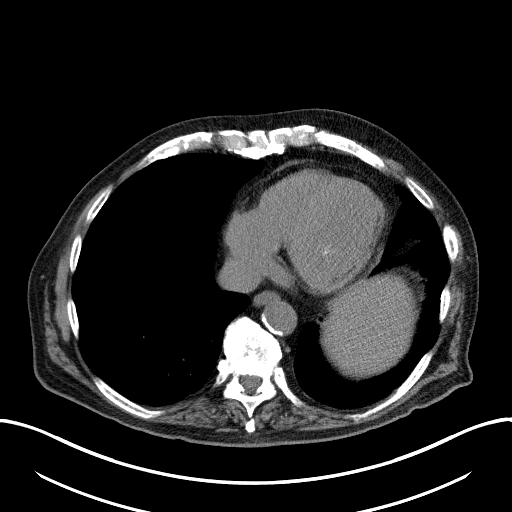
[im 25/73  lung]
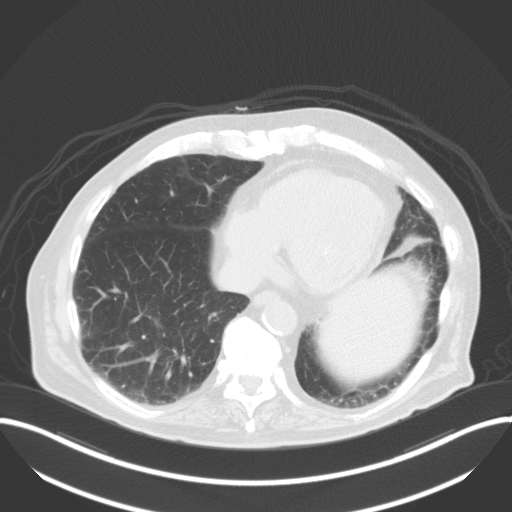
[im 29/73  lung]
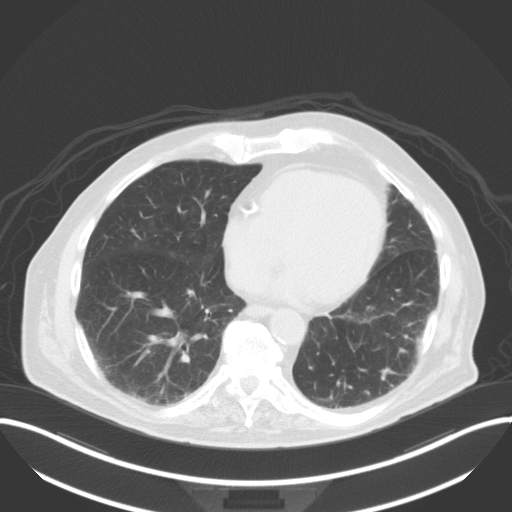
[im 33/73  lung]
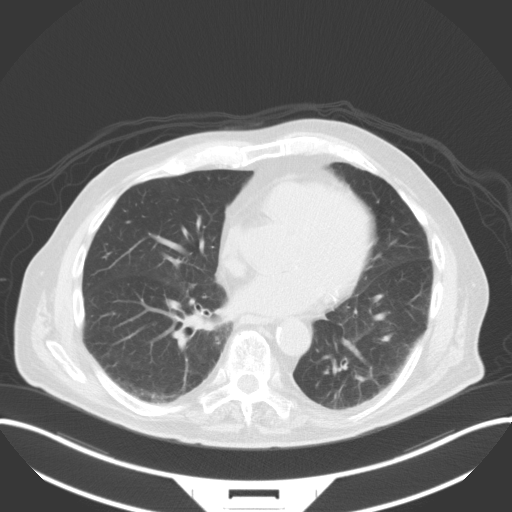
[im 41/73  lung]
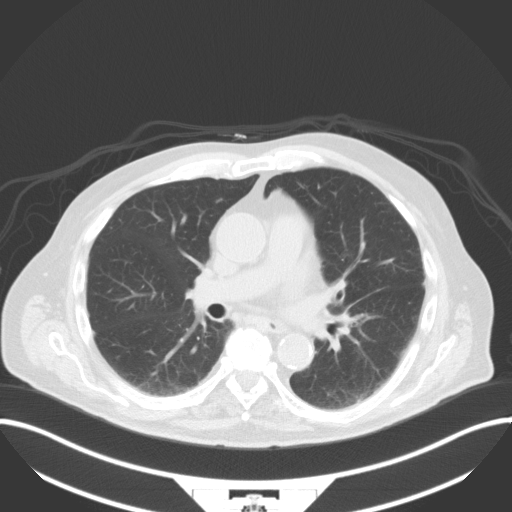
[im 44/73  mediastinal]
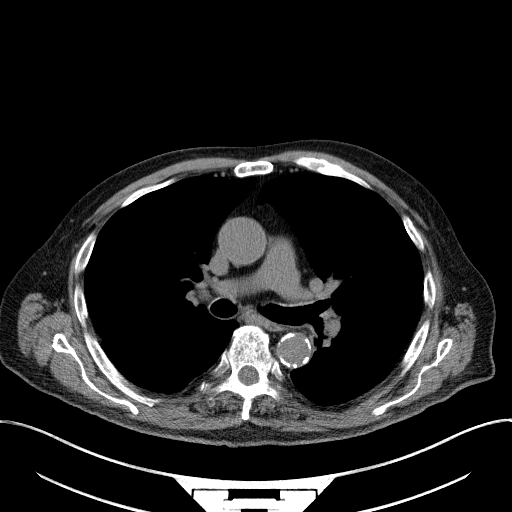
[im 44/73  lung]
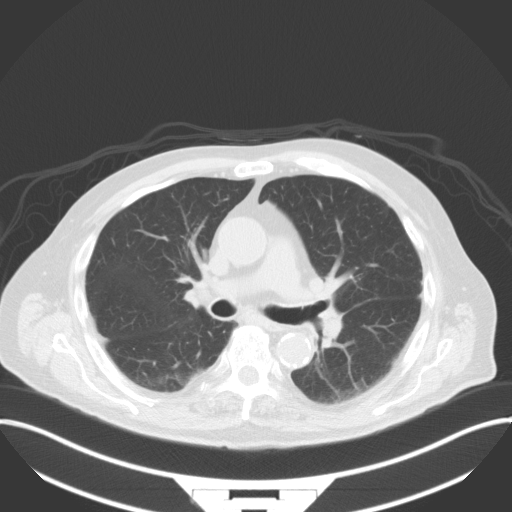
[im 49/73  lung]
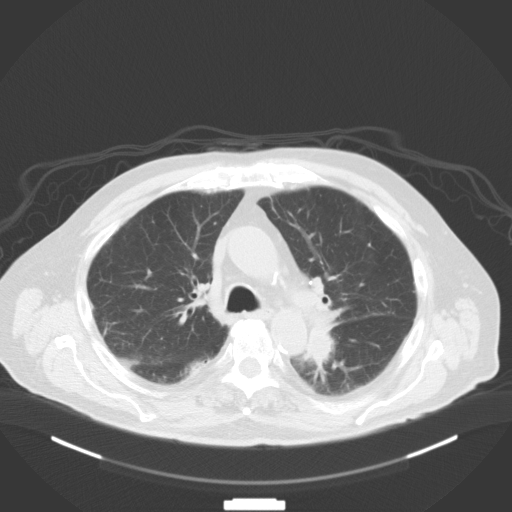
[im 54/73  lung]
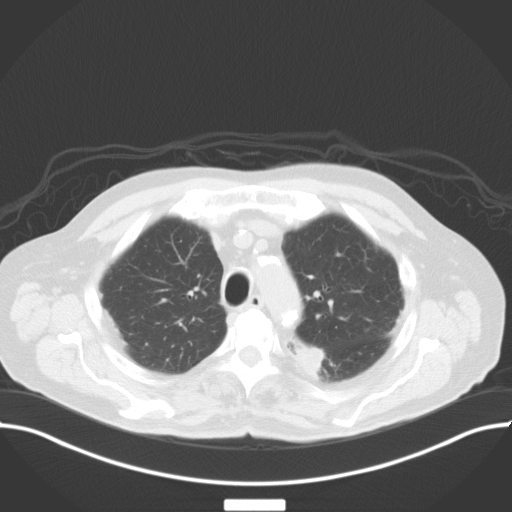
[im 58/73  lung]
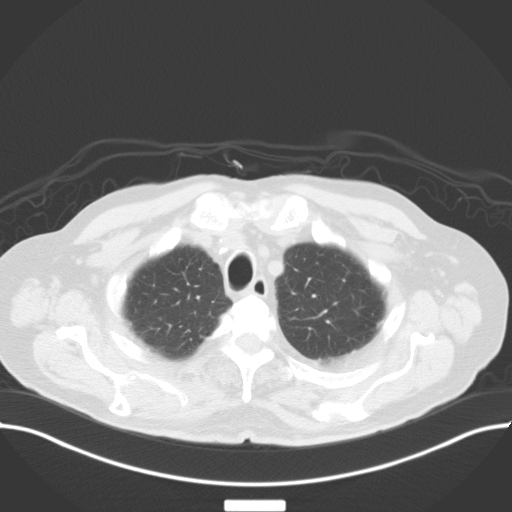
[im 62/73  mediastinal]
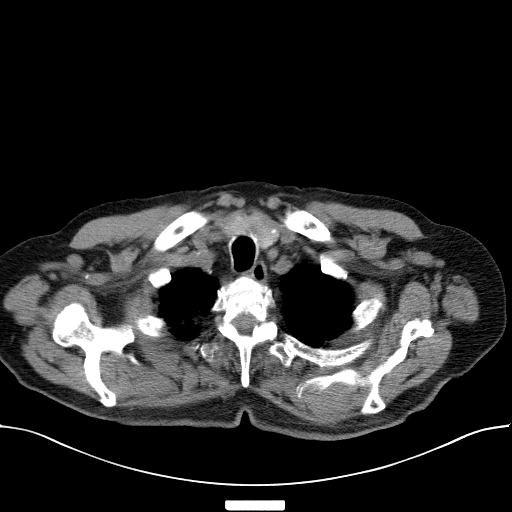
[im 62/73  lung]
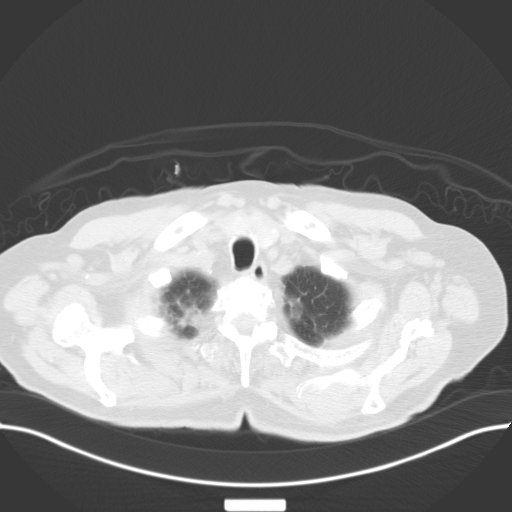
[im 67/73  lung]
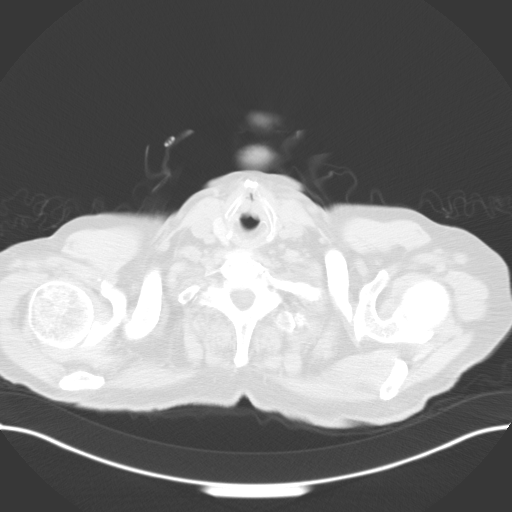

[14 of 32 positions shown; findings below may reference images not displayed]

FINDINGS: Cardiovascular: Coronary, aortic arch, and branch vessel
atherosclerotic vascular disease. Mild cardiomegaly. Fat density
along a substantial portion of the lateral wall of the left
ventricle raise the possibility of prior myocardial infarction.

Mediastinum/Nodes: No pathologic adenopathy identified.

Lungs/Pleura: Chronic biapical pleuroparenchymal scarring.

The medial superior segment left lower lobe mass measures
approximately 5.2 by 2.9 by 3.0 cm on image 80 series 604 and image
55 series 11. This abuts the major fissure and slightly indents the
major fissure for example on image 46 series 11, and also abuts the
posterior margin of the left lower lobe pulmonary artery.

Pleural base calcification and thickening of the pleural adipose
tissue along the right upper major fissure margin for example image
56 series 11, this is a chronic appearance similar to that shown on
08/08/2011.

Calcified 3 mm right lower lobe pulmonary nodule on image 116 series
11, likely postinflammatory.

0.8 by 0.6 cm nodule along the left hemidiaphragm on image 118
series 11 common no change from 08/08/2011, considered benign.

Mild bilateral airway thickening is present.

4 mm left apical nodule on image 34 series 11 appears roughly
similar to the appearance on 08/08/2011 and is considered benign.

Upper Abdomen: Cholecystectomy. Atherosclerosis. Adrenal glands
unremarkable.

Musculoskeletal: Chronic rib deformities bilaterally compatible with
old healed fractures. Thoracic spondylosis.
IMPRESSION: 1. Superior segment left lower lobe mass abutting the major fissure,
pleural margin, and posterior margin of the left lower lobe
pulmonary artery measures up to 5.2 cm in long axis. No pleural
effusion identified. No overt pathologically enlarged adenopathy.
2. Biapical scarring. Several additional small nodules are
identified but appear chronically stable and accordingly likely
benign.
3. Airway thickening is present, suggesting bronchitis or reactive
airways disease.
4.  Aortic Atherosclerosis ([id number]).  Coronary atherosclerosis.

## 2023-02-09 IMAGING — CT NM PET TUM IMG INITIAL (PI) SKULL BASE T - THIGH
1 of 10 series · 1 of 25 positions shown · non-contrast
Comparison: 02/16/2021

CLINICAL DATA: Initial treatment strategy for pulmonary nodule in
the superior segment left lower lobe.

EXAM:
NUCLEAR MEDICINE PET SKULL BASE TO THIGH
TECHNIQUE: 9.3 mCi F-18 FDG was injected intravenously. Full-ring PET imaging
was performed from the skull base to thigh after the radiotracer. CT
data was obtained and used for attenuation correction and anatomic
localization.
Fasting blood glucose: 136 mg/dl

[Series 4: ct sk_thigh 5.0 bf37 · axial · 5.0mm · 0.98mm/px · 1 of 236 slices shown]
[im 236/236  brain]
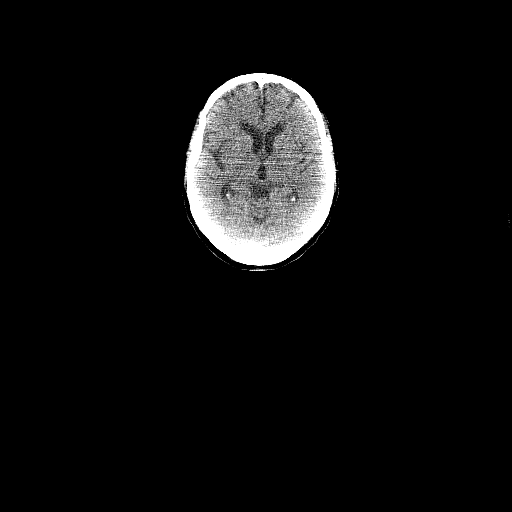

[1 of 25 positions shown; findings below may reference images not displayed]

FINDINGS: Mediastinal blood pool activity: SUV max

Liver activity: SUV max NA

NECK: No significant abnormal hypermetabolic activity in this
region.

Incidental CT findings: Chronic ethmoid and bilateral maxillary
sinusitis. Bilateral common carotid atherosclerotic calcification.

CHEST: [DATE] by 2.7 by 3.1 cm mass in the superior segment left lower
lobe has evidence of central necrosis and peripheral higher
activity, with maximum SUV of 4.8. As noted on the prior exam this
mass abuts the posterior margin of the left lower lobe pulmonary
artery and also the adjacent pleural margin.

A small left hilar lymph node has a maximum SUV of 2.8. 0.4 cm AP
window lymph node with maximum SUV 2.4.

Accentuated distal esophageal activity noted without CT correlate,
maximum SUV 4.2, most likely to be physiologic.

Incidental CT findings: Coronary, aortic arch, and branch vessel
atherosclerotic vascular disease. Mild cardiomegaly. Mild biapical
pleuroparenchymal scarring. Chronic fatty lesion of the right
posterior pleura on image 24 series 8 with coarse internal
calcification, similar to the 08/08/2011 exam, felt to be benign.

ABDOMEN/PELVIS: Substantial multifocal accentuated activity
throughout the bowel without definite correlate of CT abnormality,
likely physiologic although the appearance may lower sensitivity for
hypermetabolic polyps. Adrenal glands unremarkable.

Incidental CT findings: Atherosclerosis is present, including
aortoiliac atherosclerotic disease. Left kidney lower pole scarring.
Hypodense lesions in both kidneys are probably cysts but technically
too small to characterize. Cholecystectomy. Prior abdominal aortic
aneurysm repair.

SKELETON: No significant abnormal hypermetabolic activity in this
region.

Incidental CT findings: Postoperative findings in the lower lumbar
spine. Rib deformities from old fractures. Spondylosis.
IMPRESSION: 1. Moderately hypermetabolic superior segment left lower lobe mass,
maximum SUV 4.8, long-axis dimension 5.2 cm. Appearance compatible
with malignancy. This abuts the posterior margin of the left lower
lobe pulmonary artery and also the adjacent pleural margin.
2. A small left hilar lymph node is minimally hypermetabolic with
maximum SUV of 2.8.
3. No findings of distant metastatic spread.
4. Other imaging findings of potential clinical significance:
Chronic paranasal sinusitis. Aortic Atherosclerosis ([id number]).
Coronary atherosclerosis with mild cardiomegaly.

## 2023-02-17 IMAGING — CT CT RENAL STONE PROTOCOL
2 of 4 series · 15 of 46 positions shown, 17 images · non-contrast
Comparison: CT abdomen pelvis 02/12/2020

CLINICAL DATA: Flank pain. Kidney stone suspected. Emesis.
potential squamous cell non-small cell lung cancer. Suspect
dehydration.

EXAM:
CT ABDOMEN AND PELVIS WITHOUT CONTRAST
TECHNIQUE: Multidetector CT imaging of the abdomen and pelvis was performed
following the standard protocol without IV contrast.

[Series 2: axial st · axial · 0.86mm/px · z∈[-612,-202]mm · 12 of 94 slices shown, 14 images]
[im 6/94  soft-tissue]
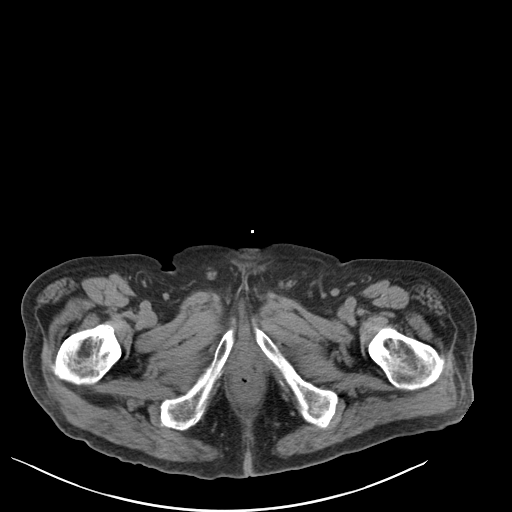
[im 6/94  bone]
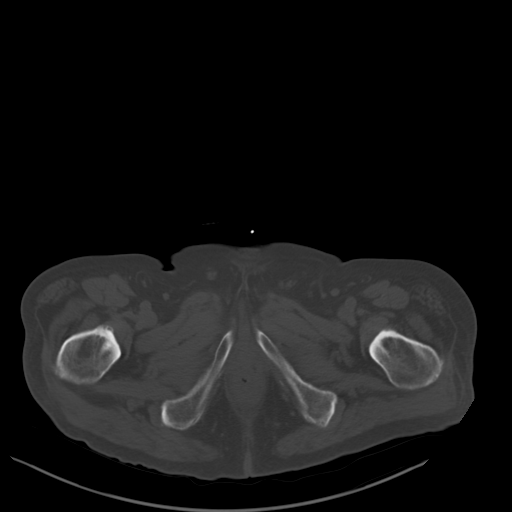
[im 17/94  soft-tissue]
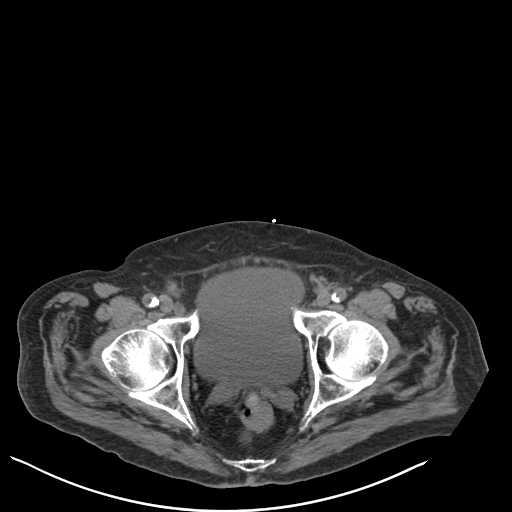
[im 22/94  soft-tissue]
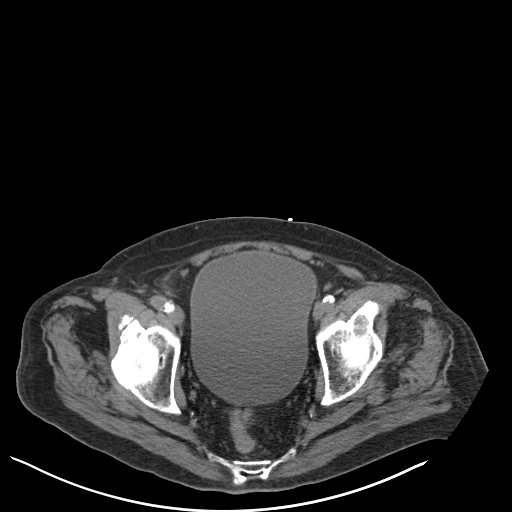
[im 28/94  soft-tissue]
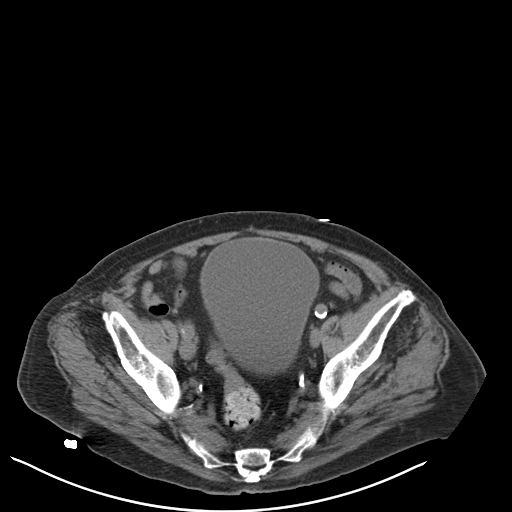
[im 39/94  soft-tissue]
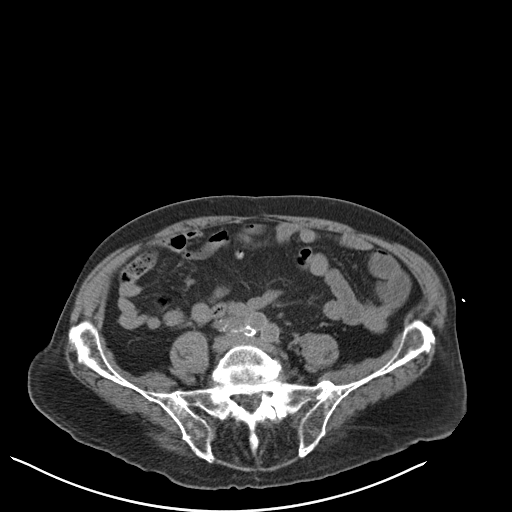
[im 44/94  soft-tissue]
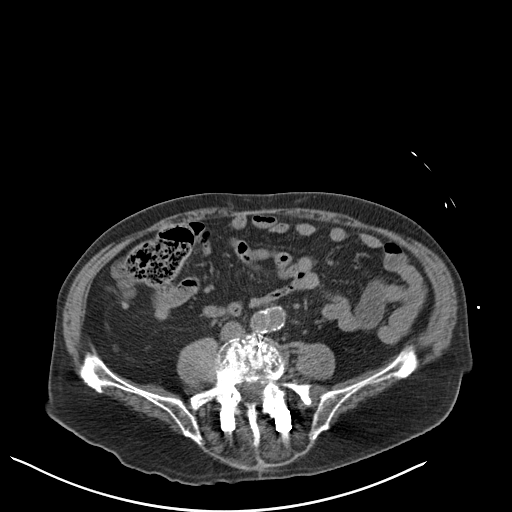
[im 50/94  soft-tissue]
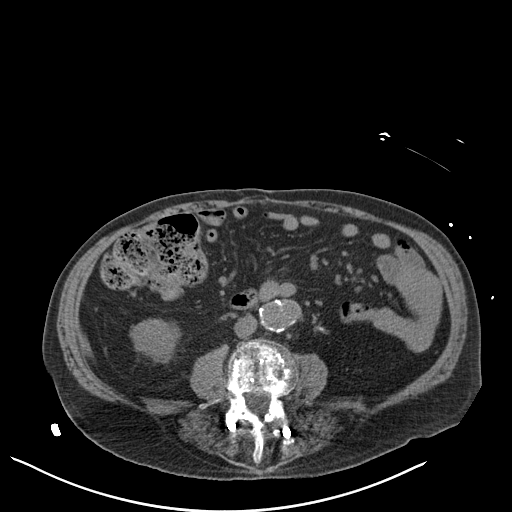
[im 61/94  soft-tissue]
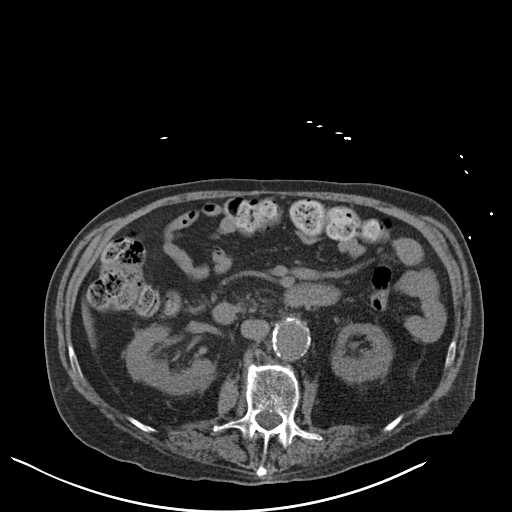
[im 66/94  soft-tissue]
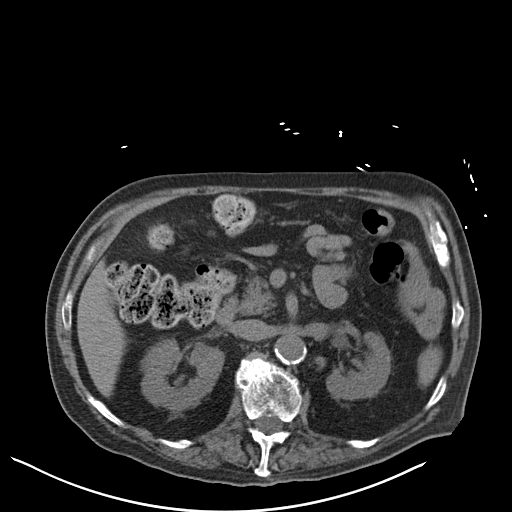
[im 66/94  bone]
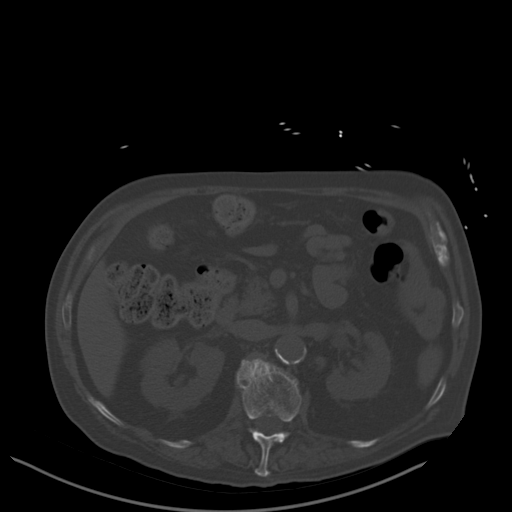
[im 72/94  soft-tissue]
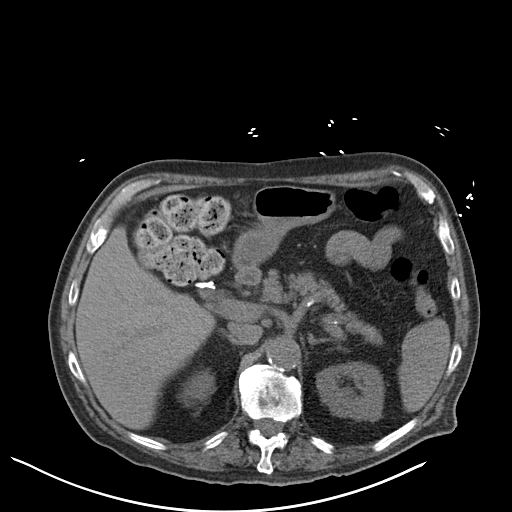
[im 83/94  soft-tissue]
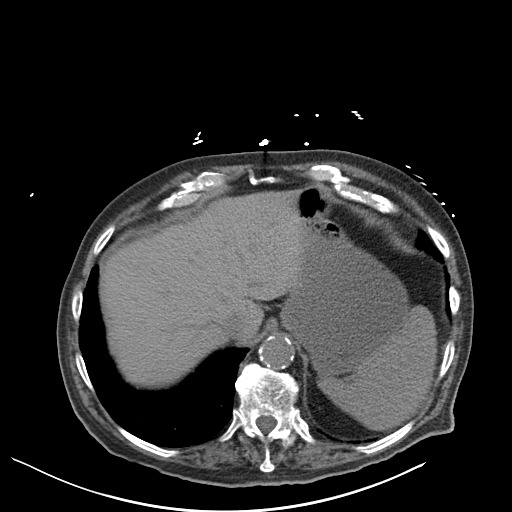
[im 88/94  soft-tissue]
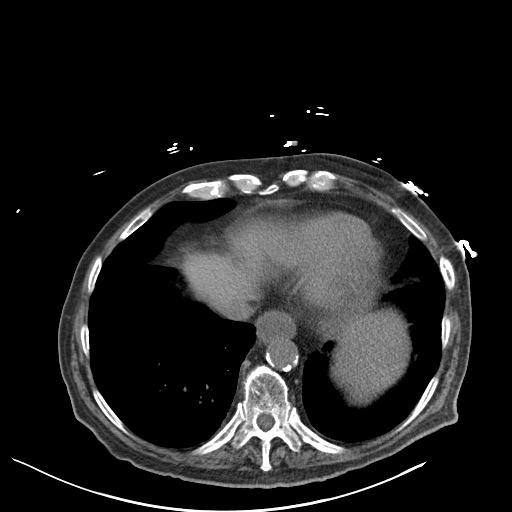

[Series 5: coronal · coronal · 0.86mm/px · 3 of 138 slices shown]
[im 46/138  soft-tissue]
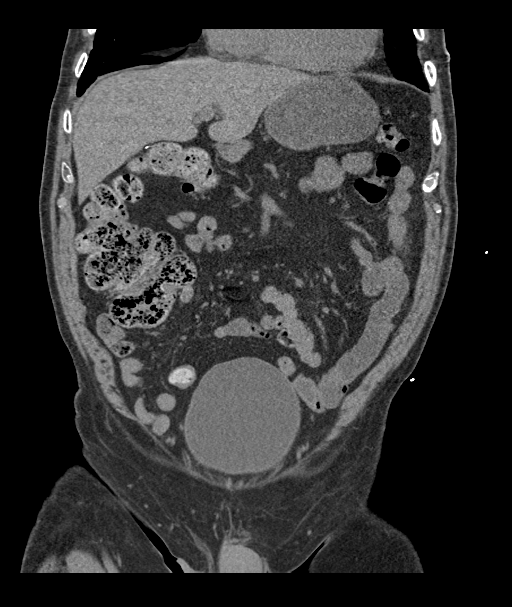
[im 61/138  soft-tissue]
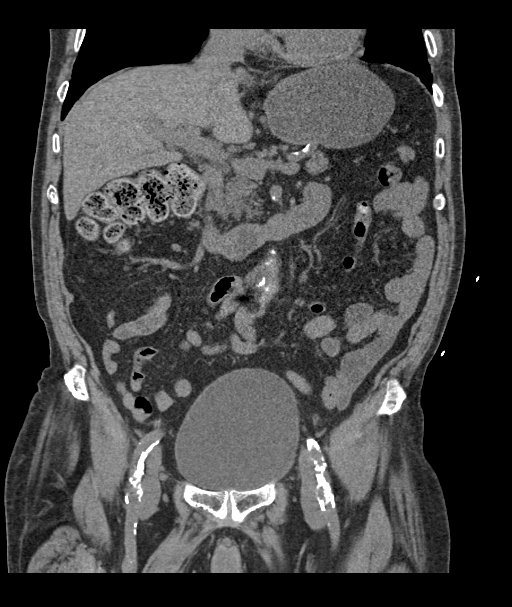
[im 77/138  soft-tissue]
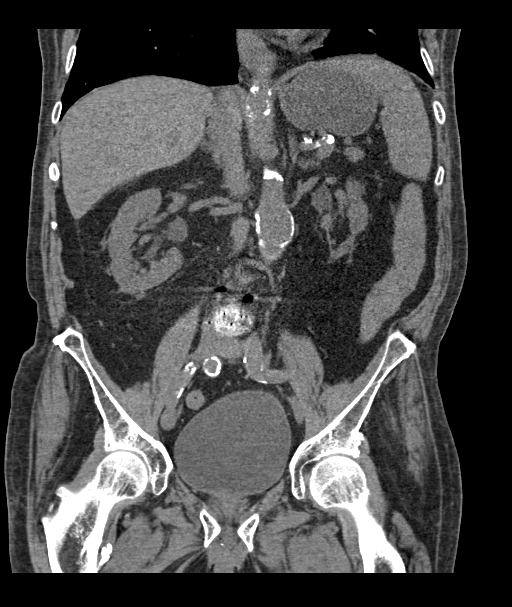

[15 of 46 positions shown; findings below may reference images not displayed]

FINDINGS: Lower chest: Interval development of cluster pulmonary micronodules
within the visualized portions of the right middle lobe ([DATE]).
There is an 8 mm nodule within left lower lobe. Bilateral lower lobe
subsegmental atelectasis. Small hiatal hernia with fluid noted
within the lumen of the hiatal hernia/distal esophagus.

Hepatobiliary: No focal liver abnormality. No gallstones,
gallbladder wall thickening, or pericholecystic fluid. No biliary
dilatation.

Pancreas: No focal lesion. Normal pancreatic contour. No surrounding
inflammatory changes. No main pancreatic ductal dilatation.

Spleen: Normal in size without focal abnormality.

Adrenals/Urinary Tract:

No adrenal nodule bilaterally.

Nonspecific bilateral perinephric stranding. A 4 mm in 3 mm
calcified stone within the inferior pole of the left kidney. No
hydronephrosis. Fluid density lesions within the kidneys likely
represent simple renal cysts.

No ureterolithiasis or hydroureter.

The urinary bladder is unremarkable.

Stomach/Bowel: Stomach is within normal limits. No evidence of bowel
wall thickening or dilatation. Large amount of stool in the cecum.
The appendix appears normal.

Vascular/Lymphatic: Stable 3.7 x 3.2 cm infrarenal abdominal aorta
aneurysm that extends approximately 3 cm in the craniocaudal
dimension. Bilateral common iliac arteries measure at the upper
limits of normal: 1.5 cm. Internal iliac arteries are also enlarged
measuring up to 1.4 cm on the right. Mild atherosclerotic plaque of
the aorta and its branches. No abdominal, pelvic, or inguinal
lymphadenopathy.

Reproductive: Prostate is unremarkable.

Other: No intraperitoneal free fluid. No intraperitoneal free gas.
No organized fluid collection.

Musculoskeletal:

No abdominal wall hernia or abnormality.

No suspicious lytic or blastic osseous lesions. No acute displaced
fracture. Multilevel degenerative changes of the spine. L3-L5
posterolateral fusion.
IMPRESSION: 1. Cluster of pulmonary micronodules within the right middle lobe
may represent infection/inflammation.
2. An 8 mm left lower lobe pulmonary nodule.
3. Small hiatal hernia with fluid noted within the lumen of the
hiatal hernia/distal esophagus.
4. Nonobstructive 3-4 mm left nephrolithiasis.
5. Stable infrarenal abdominal aorta aneurysm (3.7 cm). Stable
bilateral common iliac artery aneurysms (1.5 cm) and right internal
iliac artery (1.4cm.). Recommend follow-up ultrasound every 3 years.
This recommendation follows ACR consensus guidelines: White Paper of
the ACR Incidental Findings Committee II on Vascular Findings. [HOSPITAL] 0015; [DATE].
6. Aortic Atherosclerosis (R8R7N-S39.9). Aortic aneurysm NOS
(R8R7N-RY8.Q).

## 2023-02-17 IMAGING — DX DG CHEST 1V PORT
2 series · 2 of 2 positions shown · non-contrast
Comparison: 07/09/2021 plain film.  CT of 07/06/2021.

CLINICAL DATA: Chest pain for 1 day.  Known lung nodule.

EXAM:
PORTABLE CHEST 1 VIEW

[chest ap (1 of 2)]
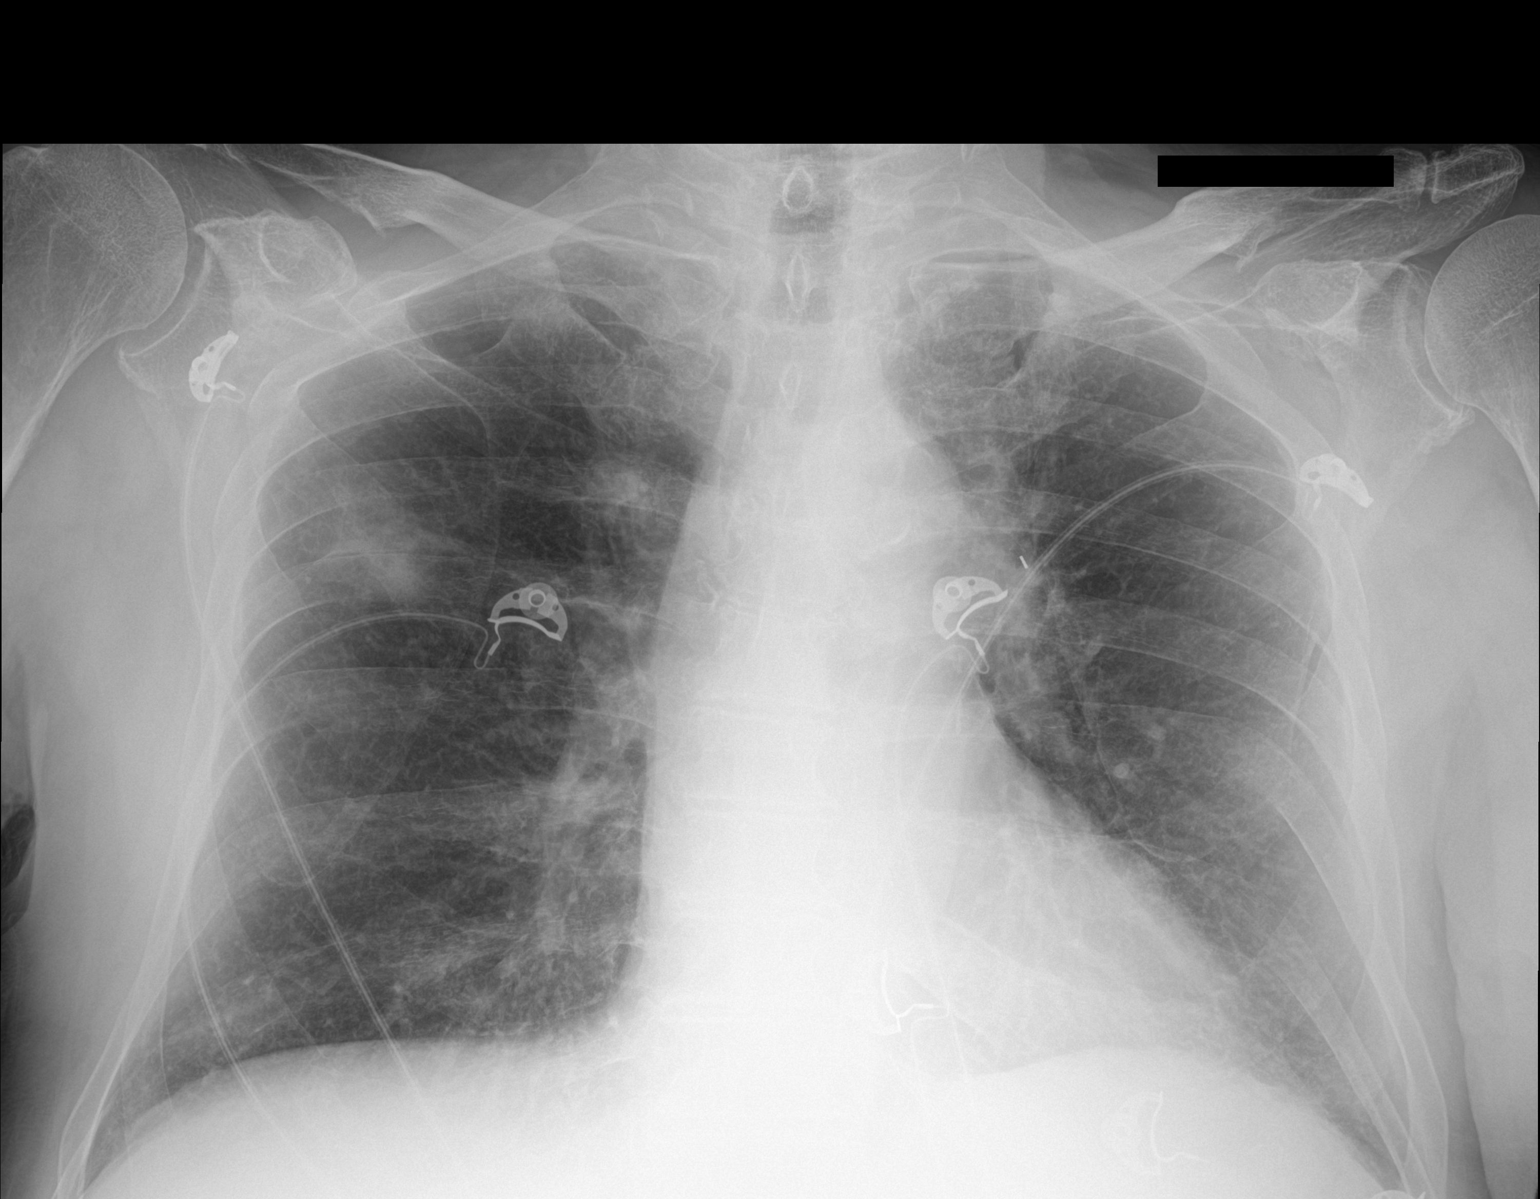

[chest ap (2 of 2)]
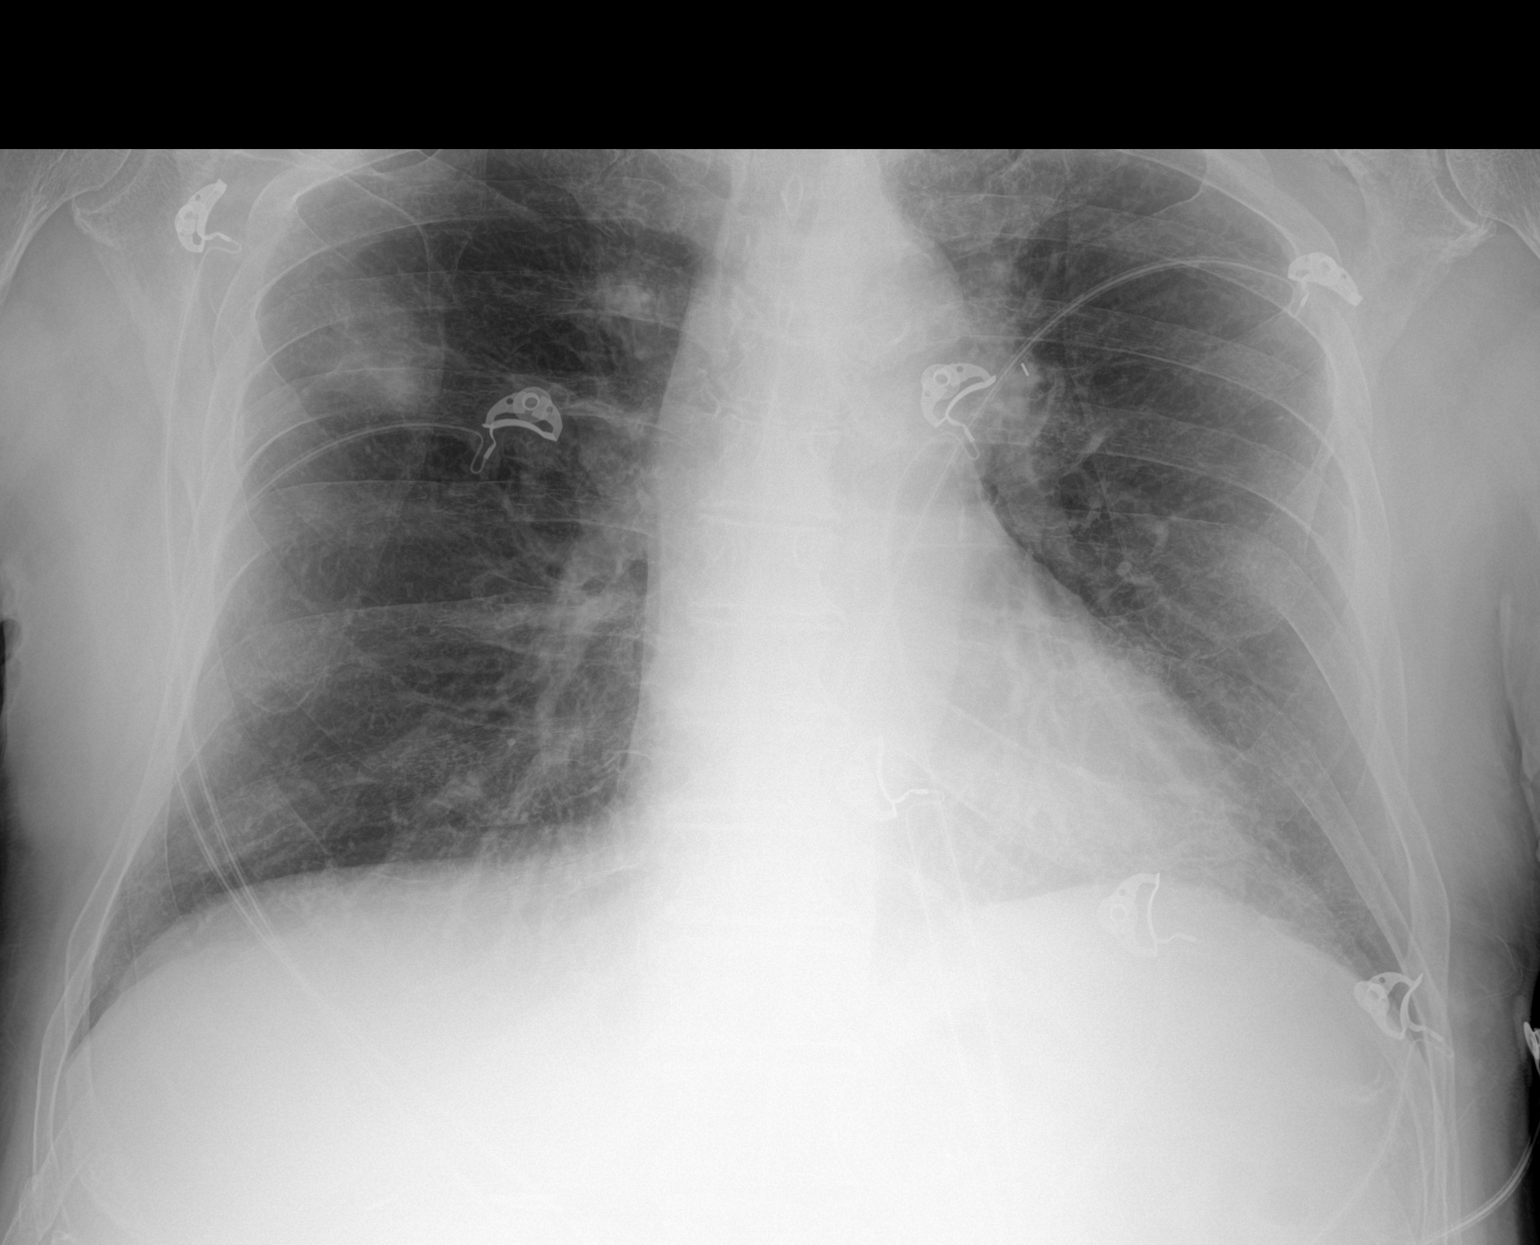

[2 of 2 positions shown; findings below may reference images not displayed]

FINDINGS: Midline trachea. Normal heart size. Atherosclerosis in the
transverse aorta. No pleural effusion or pneumothorax. Left
suprahilar lung lesion has been delineated on prior CTs. Apparent
right upper lobe nodule likely corresponds to pleural thickening and
calcification when correlated with recent CT. No lobar
consolidation.
IMPRESSION: Superior segment left lower lobe lung lesion, as on recent CT. No
superimposed process or explanation for chest pain.

## 2023-03-01 IMAGING — MR MR HEAD WO/W CM
14 of 16 series · 40 of 48 positions shown · IV contrast (gadavist)
Comparison: Brain MRI without and with contrast 07/05/2019. Head CT
04/16/2021 and earlier.

CLINICAL DATA: 80-year-old male with hypermetabolic left lower lobe
lung mass diagnosed by PET-CT earlier this month. Staging non-small
cell lung cancer. Small subdural hematoma in [REDACTED] this year after
injury.

EXAM:
MRI HEAD WITHOUT AND WITH CONTRAST
TECHNIQUE: Multiplanar, multiecho pulse sequences of the brain and surrounding
structures were obtained without and with intravenous contrast.
CONTRAST:  10mL GADAVIST GADOBUTROL 1 MMOL/ML IV SOLN

[Series 5: DWI · axial · 3.0mm · 0.88mm/px · z∈[-57,+82]mm · 6 of 96 slices shown (1 of 4)]
[im 1/96]
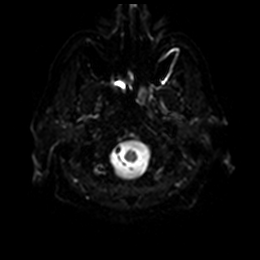
[im 20/96]
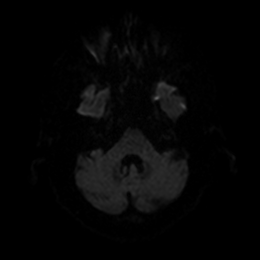
[im 39/96]
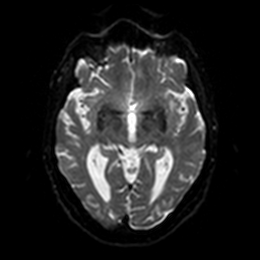
[im 58/96]
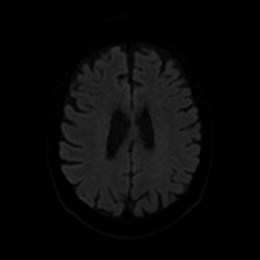
[im 77/96]
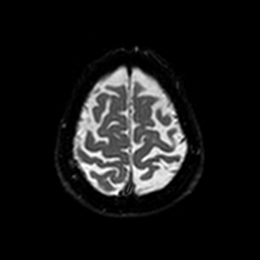
[im 96/96]
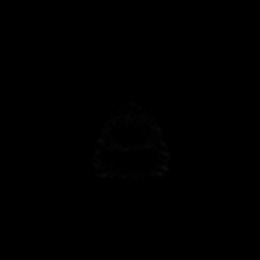

[Series 6: DWI · axial · 3.0mm · 0.88mm/px · z∈[-57,+82]mm · 3 of 48 slices shown (2 of 4)]
[im 1/48]
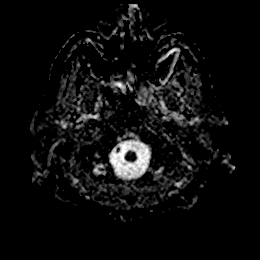
[im 24/48]
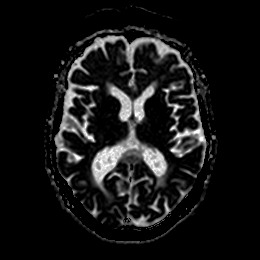
[im 48/48]
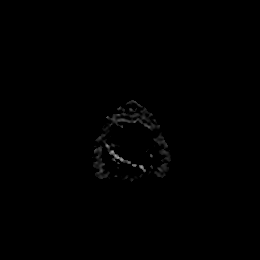

[Series 7: DWI · coronal · 4.0mm · 0.88mm/px · 4 of 64 slices shown (3 of 4)]
[im 1/64]
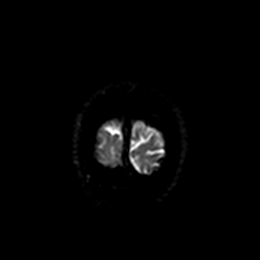
[im 22/64]
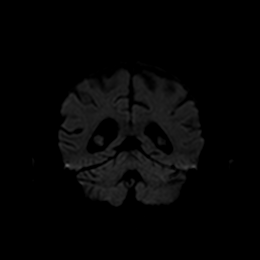
[im 43/64]
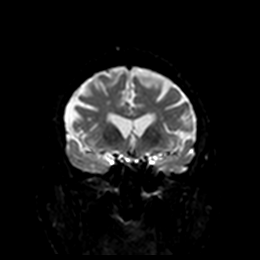
[im 64/64]
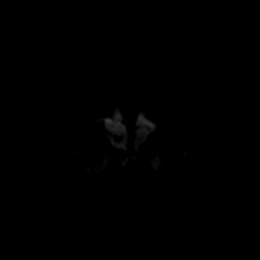

[Series 8: DWI · coronal · 4.0mm · 0.88mm/px · 2 of 32 slices shown (4 of 4)]
[im 1/32]
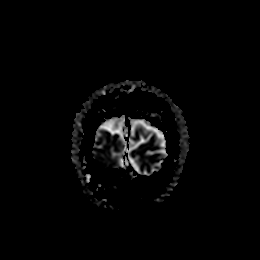
[im 32/32]
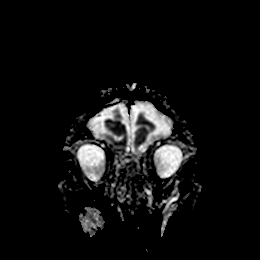

[Series 9: T1 · sagittal · 5.0mm · 0.75mm/px · 1 of 23 slices shown]
[im 1/23]
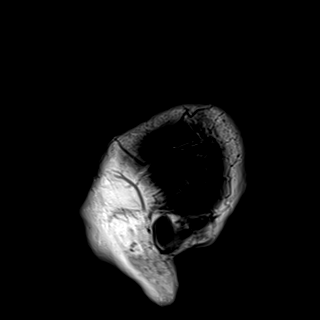

[Series 10: T2 · axial · 5.0mm · 0.72mm/px · z∈[-64,+79]mm · 2 of 25 slices shown]
[im 1/25]
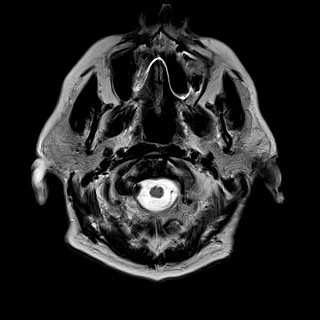
[im 25/25]
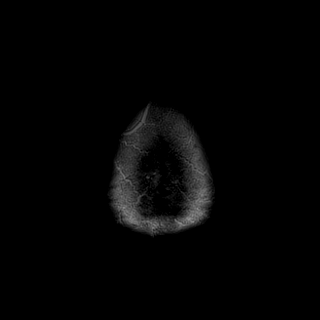

[Series 11: FLAIR · axial · 5.0mm · 0.45mm/px · z∈[-65,+78]mm · 2 of 25 slices shown]
[im 1/25]
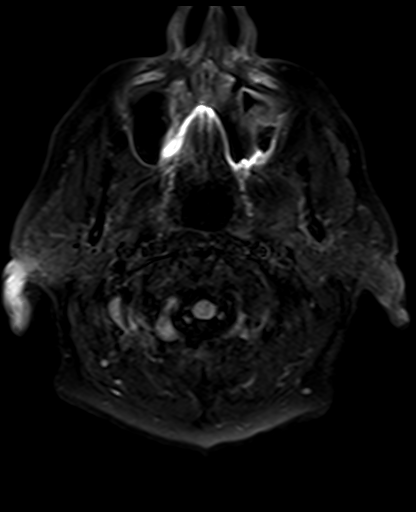
[im 25/25]
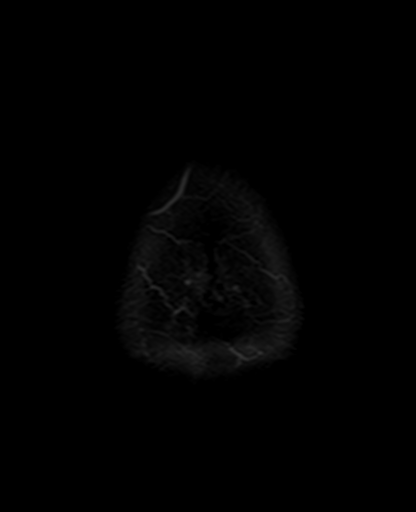

[Series 12: mag_images · axial · 3.0mm · 0.90mm/px · z∈[-76,+99]mm · 4 of 60 slices shown]
[im 1/60]
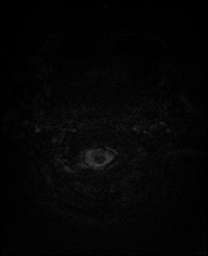
[im 20/60]
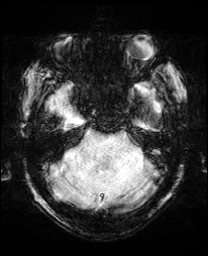
[im 40/60]
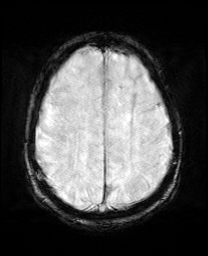
[im 60/60]
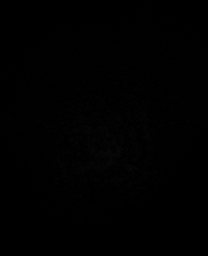

[Series 13: pha_images · axial · 3.0mm · 0.90mm/px · z∈[-73,+96]mm · 4 of 57 slices shown]
[im 1/57]
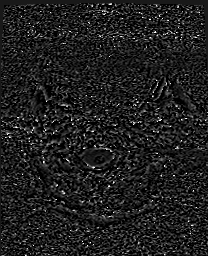
[im 19/57]
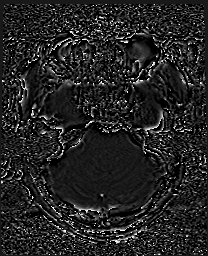
[im 38/57]
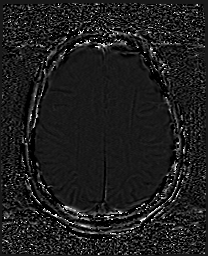
[im 57/57]
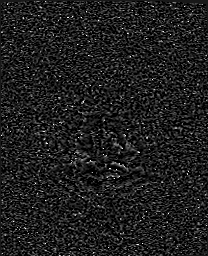

[Series 14: swi_images · axial · 3.0mm · 0.90mm/px · z∈[-76,+99]mm · 4 of 60 slices shown]
[im 1/60]
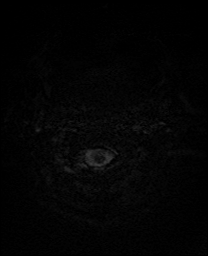
[im 20/60]
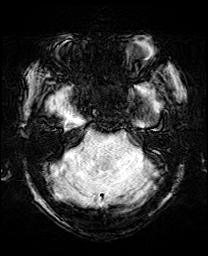
[im 40/60]
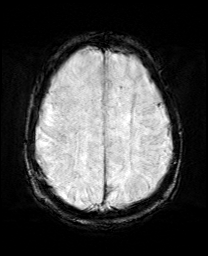
[im 60/60]
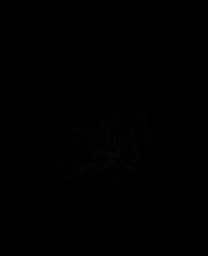

[Series 15: mip_images(sw) · axial · 24.0mm · 0.90mm/px · z∈[-65,+89]mm · 3 of 53 slices shown]
[im 1/53]
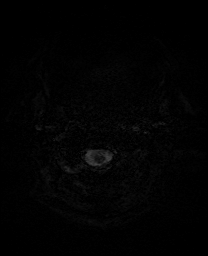
[im 27/53]
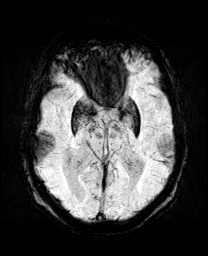
[im 53/53]
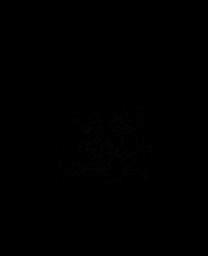

[Series 17: T2 post-contrast · coronal · 5.0mm · 0.72mm/px · 2 of 28 slices shown]
[im 1/28]
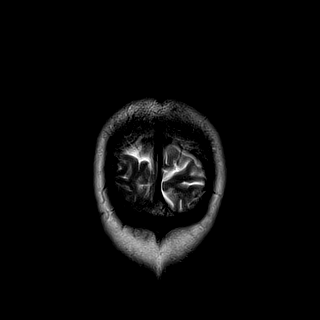
[im 28/28]
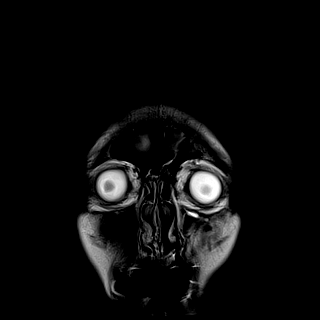

[Series 19: T1 post-contrast · coronal · 5.0mm · 0.34mm/px · 2 of 28 slices shown (1 of 2)]
[im 1/28]
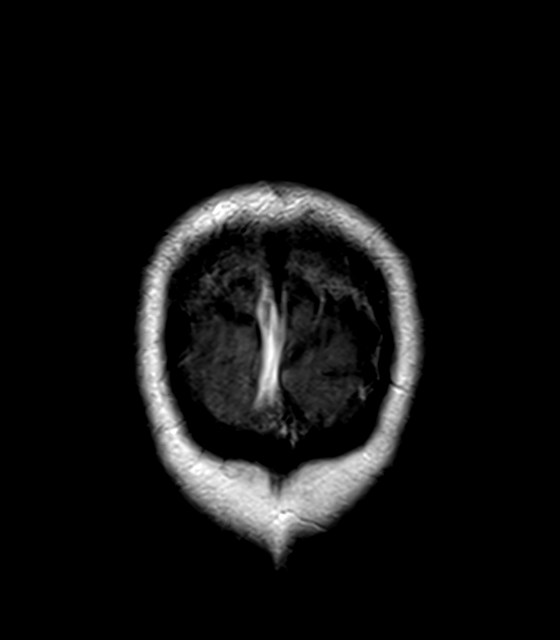
[im 28/28]
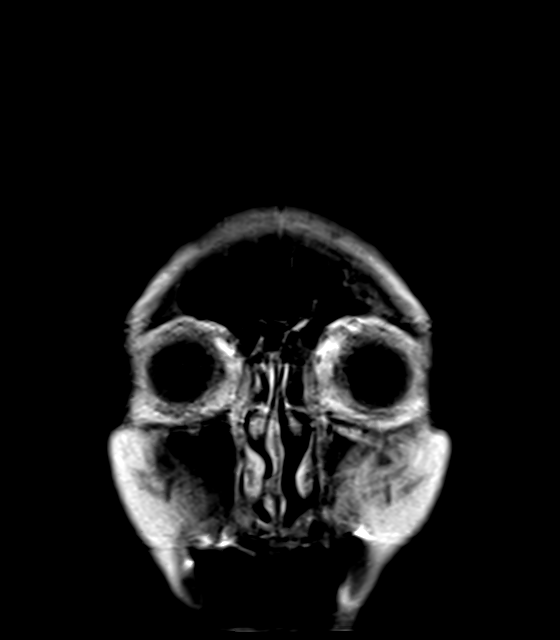

[Series 20: T1 post-contrast · sagittal · 5.0mm · 0.72mm/px · 1 of 23 slices shown (2 of 2)]
[im 1/23]
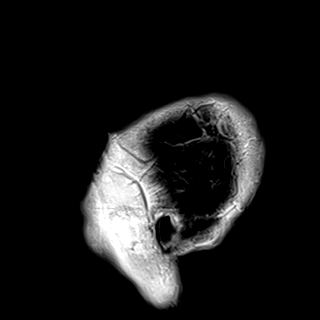

[40 of 48 positions shown; findings below may reference images not displayed]

FINDINGS: Brain: Stable cerebral volume since 6060. No restricted diffusion to
suggest acute infarction. No midline shift, mass effect, evidence of
mass lesion, ventriculomegaly, extra-axial collection or acute
intracranial hemorrhage. Cervicomedullary junction and pituitary are
within normal limits.

Mild motion artifact on postcontrast imaging today. No abnormal
enhancement identified. No dural thickening.

No residual subdural blood identified. Several chronic
microhemorrhages in the superior left frontal and right parietal
lobes demonstrated by SWI on series 14, image 42. And there is
probable hemosiderin at the right inferior temporal lobe hemorrhagic
contusion site seen in [REDACTED], with associated focal cortical
encephalomalacia there now (series 17, image 13).

No other cortical encephalomalacia identified. Patchy and scattered
bilateral white matter T2 and FLAIR hyperintensity has not
significantly changed since 6060. Deep gray nuclei, brainstem and
cerebellum remain within normal limits for age.

Vascular: Major intracranial vascular flow voids are stable since
6060 with dominant right vertebral artery supplying the basilar. The
major dural venous sinuses are enhancing and appear to be patent.

Skull and upper cervical spine: Negative for age visible cervical
spine. Visualized bone marrow signal is within normal limits.

Sinuses/Orbits: Negative orbits. Mild new paranasal sinus mucosal
thickening since 6060, but resolved hemorrhage in the left maxillary
sinus since [REDACTED]. Mastoids remain clear.

Other: Visible internal auditory structures appear normal. Negative
visible scalp and face.
IMPRESSION: 1. No metastatic disease or acute intracranial abnormality
identified.

2. Expected resolution/evolution of posttraumatic brain injury since
[REDACTED], including right inferior temporal gyrus encephalomalacia at
the site of prior hemorrhagic contusion.

## 2023-04-08 ENCOUNTER — Other Ambulatory Visit: Payer: Medicare Other

## 2023-04-10 ENCOUNTER — Ambulatory Visit: Payer: Medicare Other | Admitting: Internal Medicine

## 2023-06-26 IMAGING — CT CT CHEST W/ CM
2 of 5 series · 14 of 36 positions shown, 17 images · IV contrast (OMNIPAQUE)
Comparison: Comparison is made with July 05, 2021.

CLINICAL DATA: History of non-small cell lung cancer for
staging/follow-up. Post chemo and radiotherapy by report in this
80-year-old male.

EXAM:
CT CHEST WITH CONTRAST
TECHNIQUE: Multidetector CT imaging of the chest was performed during
intravenous contrast administration.

[Series 4: super d · axial · 0.84mm/px · z∈[-313,-29]mm · 11 of 336 slices shown, 14 images]
[im 26/336  mediastinal]
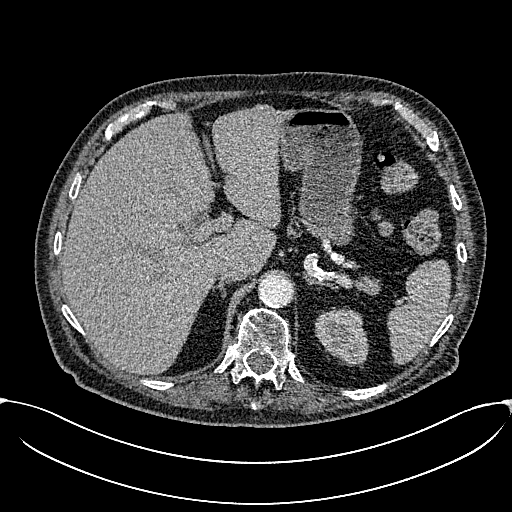
[im 26/336  lung]
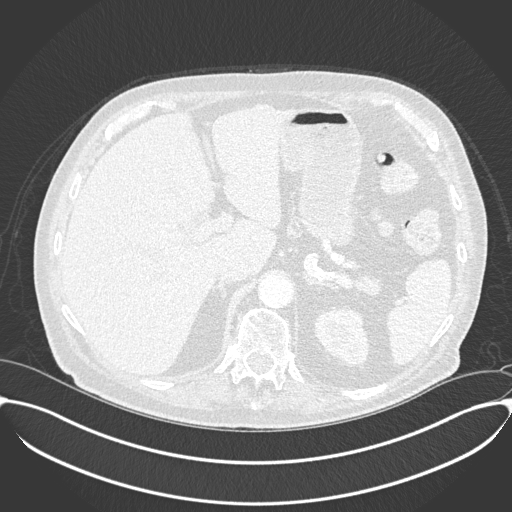
[im 52/336  lung]
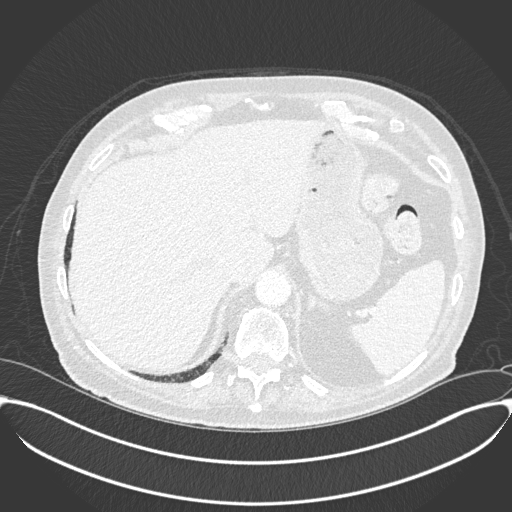
[im 78/336  lung]
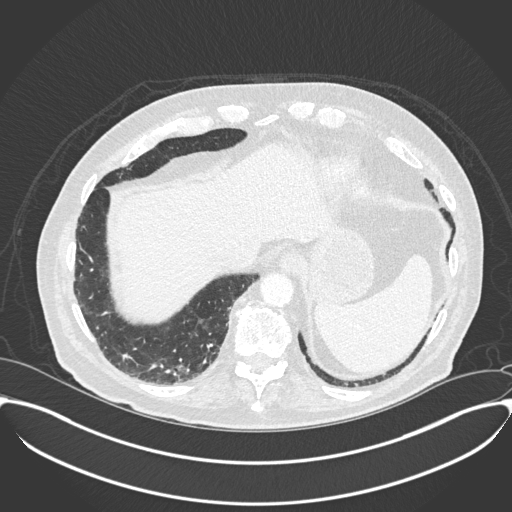
[im 104/336  lung]
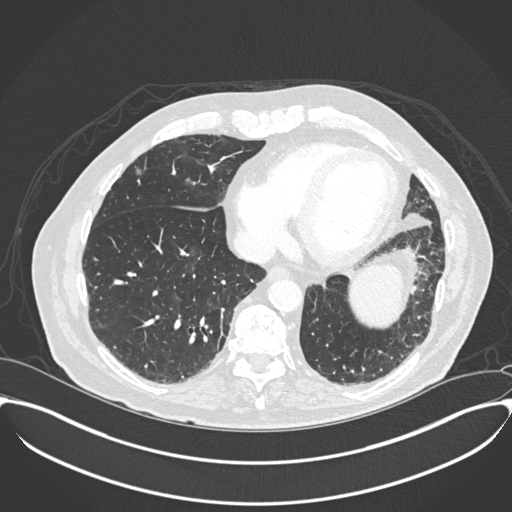
[im 129/336  mediastinal]
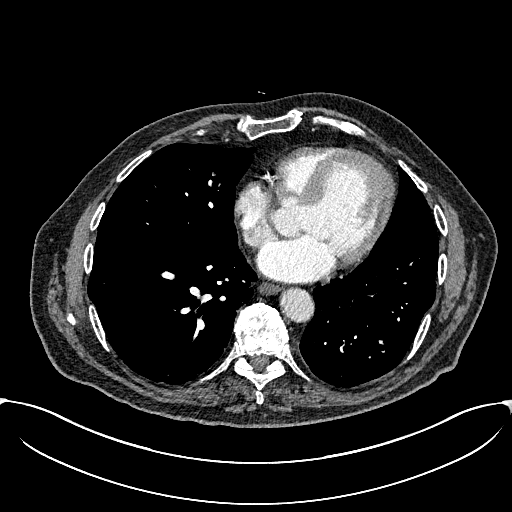
[im 129/336  lung]
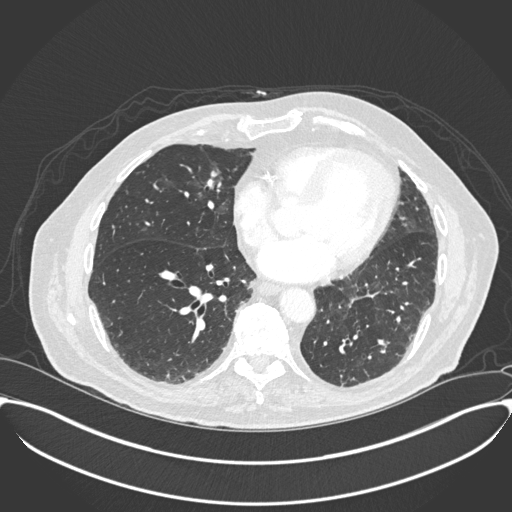
[im 181/336  lung]
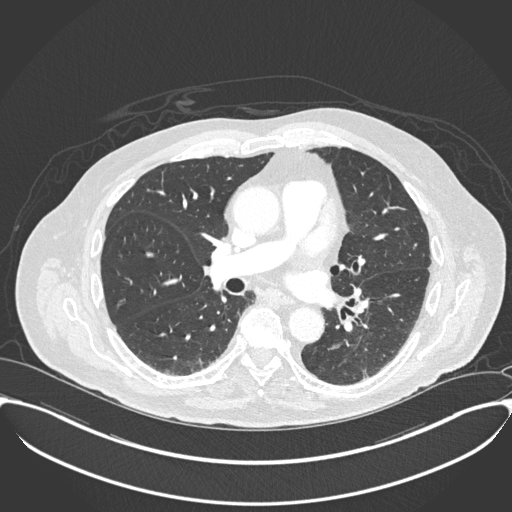
[im 207/336  lung]
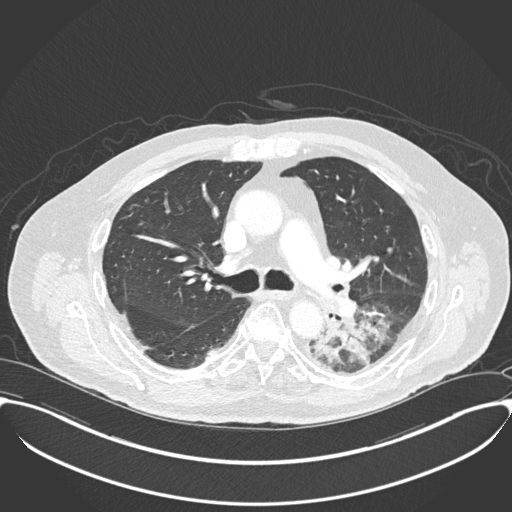
[im 232/336  lung]
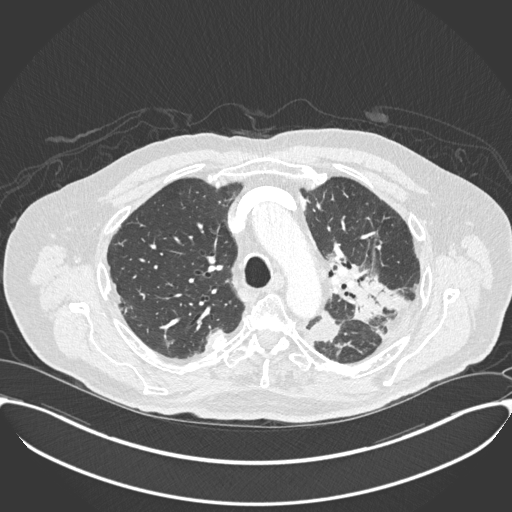
[im 258/336  mediastinal]
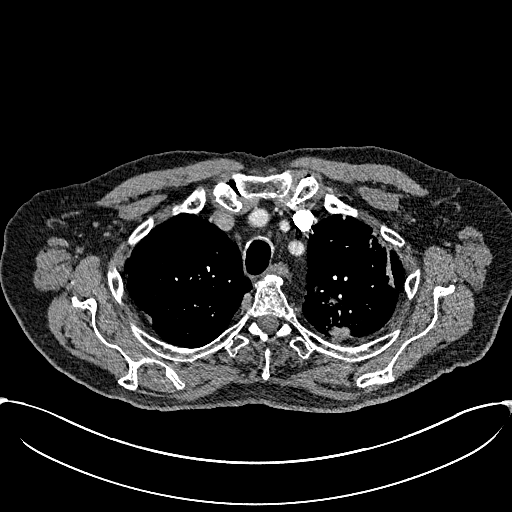
[im 258/336  lung]
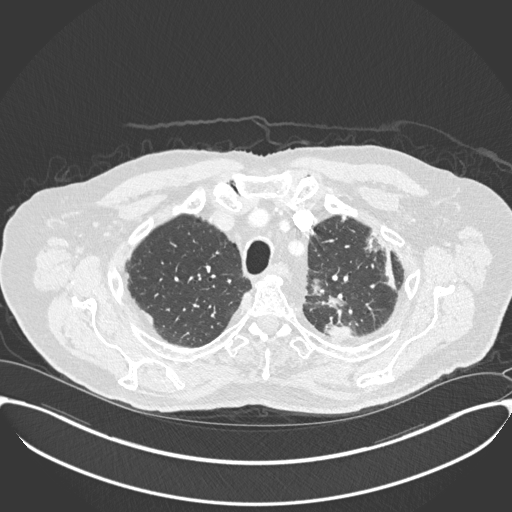
[im 284/336  lung]
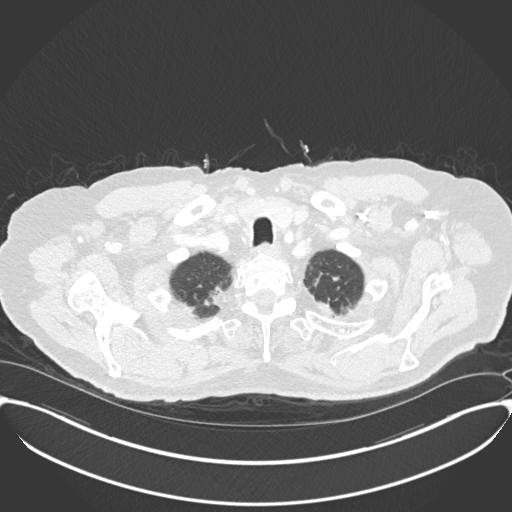
[im 310/336  lung]
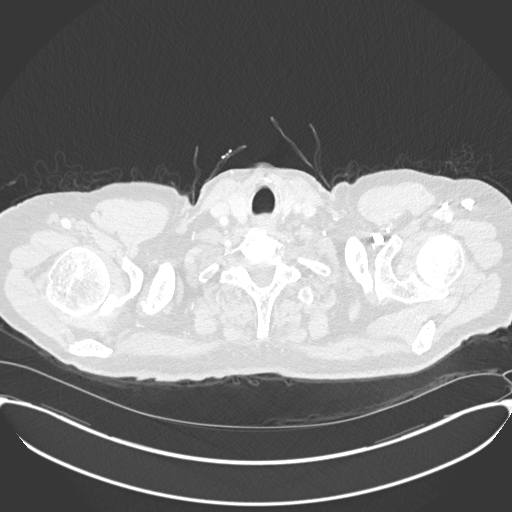

[Series 5: coronal · coronal · 0.69mm/px · 3 of 150 slices shown]
[im 30/150  lung]
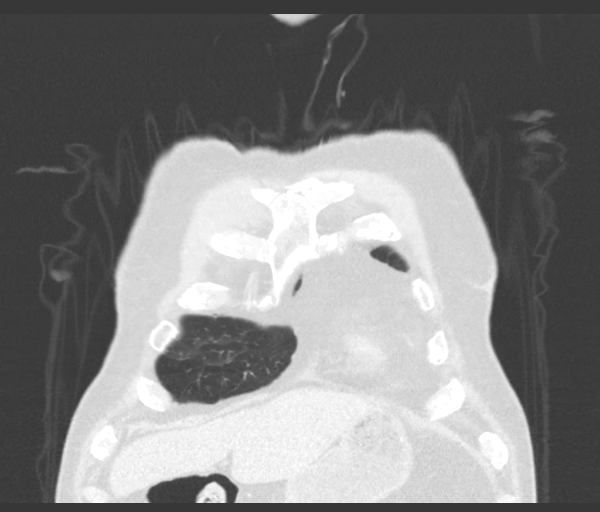
[im 60/150  lung]
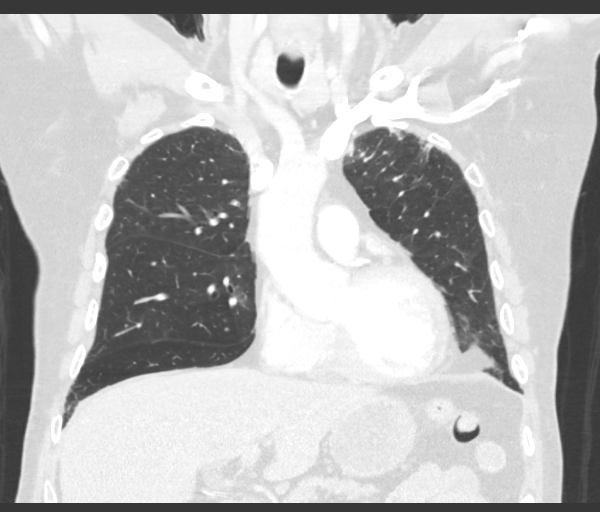
[im 90/150  lung]
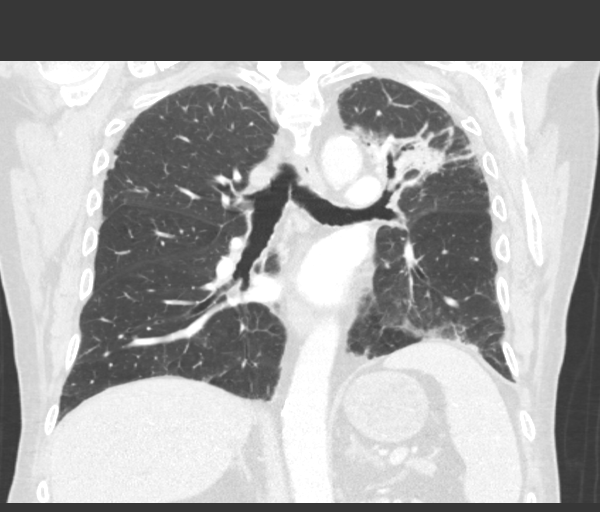

[14 of 36 positions shown; findings below may reference images not displayed]

RADIATION DOSE REDUCTION: This exam was performed according to the
departmental dose-optimization program which includes automated
exposure control, adjustment of the mA and/or kV according to
patient size and/or use of iterative reconstruction technique.

CONTRAST:  75mL OMNIPAQUE IOHEXOL 300 MG/ML  SOLN
FINDINGS: Cardiovascular: Signs of prior myocardial infarction involving the
lateral wall of the LEFT ventricle with sub endocardial fat
deposition similar to prior imaging. Extensive 3 vessel coronary
artery calcification. Mild cardiac enlargement without pericardial
effusion. Calcified and noncalcified atheromatous plaque of the
thoracic aorta without aneurysmal dilation. Central pulmonary
vasculature is normal caliber. Unremarkable on venous phase
assessment.

Mediastinum/Nodes: Esophagus is grossly normal. No adenopathy in the
chest.

Lungs/Pleura: Patchy ground-glass is developed in the RIGHT middle
lobe.

Masslike area tethering the fissure in the LEFT chest has diminished
in size measuring 2.6 x 2.0 cm greatest axial dimension (image 53/7)
this previously measured 3.1 x 2.9 cm.

Consolidative changes in the LEFT mid chest arise from the LEFT
hilum and most indicative of post radiation changes extending from
the hilum into the peripheral LEFT chest and LEFT upper lobe as well
as superior segment of the LEFT lower lobe.

Biapical scarring as before.

New pulmonary nodule in the LEFT lower lobe with spiculated
morphology (image 110/7) 15 x 10 mm.

Mild pulmonary emphysema.

Patent airways in the RIGHT chest with mild narrowing of airways at
the LEFT hilum particularly to LEFT upper lobe and superior segment
of LEFT lower lobe. No pleural effusion.

Upper Abdomen: Incidental imaging of upper abdominal contents shows
normal appearance of imaged portions of the adrenal glands and no
acute findings.

Musculoskeletal: No acute bone finding. No destructive bone process.
Spinal degenerative changes.
IMPRESSION: 1. New pulmonary nodule in the LEFT lower lobe with spiculated
morphology, measuring 15 x 10 mm. Findings are concerning for
metastatic disease.
2. Post radiation changes about the LEFT hilum extending into the
peripheral LEFT upper and lower lobe as described with diminishing
size of the pulmonary mass along the major fissure in the LEFT
chest.
3. Patchy areas of ground-glass in the RIGHT middle lobe and to a
lesser extent RIGHT lower lobe may reflect mild infectious or
inflammatory process, attention on follow-up.
4. Coronary artery disease and aortic atherosclerosis with mild
pulmonary emphysema.
5. Signs of prior myocardial infarction.

Aortic Atherosclerosis (3Z8UG-TGW.W) and Emphysema ([id number].K).

## 2023-07-17 IMAGING — CT NM PET TUM IMG RESTAG (PS) SKULL BASE T - THIGH
7 series · 25 of 25 positions shown · non-contrast
Comparison: Chest CT 11/19/2021. PET-CT 07/05/2021

CLINICAL DATA: Subsequent treatment strategy for non-small cell
lung cancer.

EXAM:
NUCLEAR MEDICINE PET SKULL BASE TO THIGH
TECHNIQUE: 10.6 mCi F-18 FDG was injected intravenously. Full-ring PET imaging
was performed from the skull base to thigh after the radiotracer. CT
data was obtained and used for attenuation correction and anatomic
localization.
Fasting blood glucose: 358 mg/dl

[Series 3: pet sk_thigh ac · axial · 5.0mm · 4.07mm/px · z∈[-949,+19]mm · 6 of 243 slices shown]
[im 1/243]
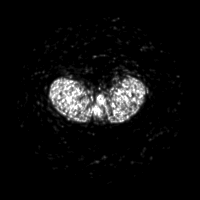
[im 49/243]
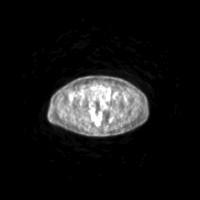
[im 97/243]
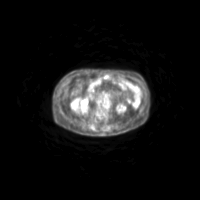
[im 146/243]
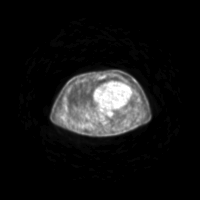
[im 194/243]
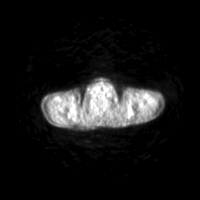
[im 243/243]
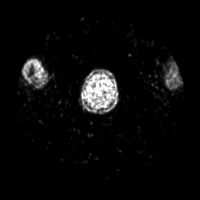

[Series 4: ct sk_thigh 5.0 hd_fov · axial · 5.0mm · 1.17mm/px · z∈[-949,+19]mm · 5 of 243 slices shown]
[im 1/243]
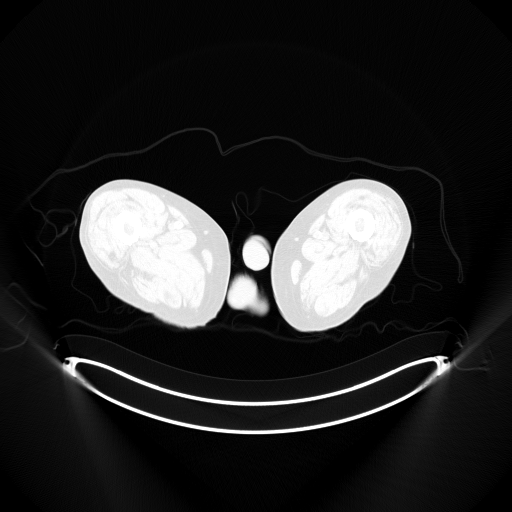
[im 61/243]
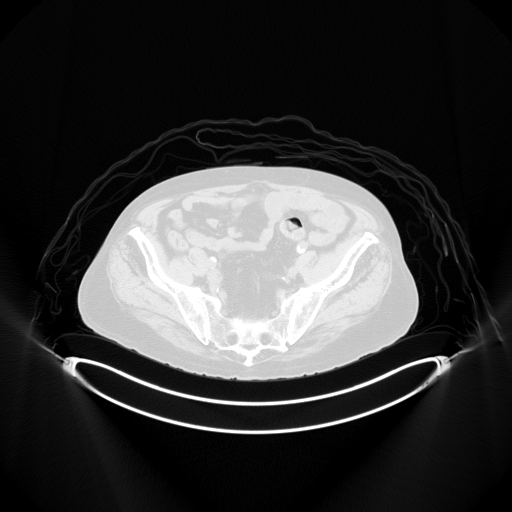
[im 122/243]
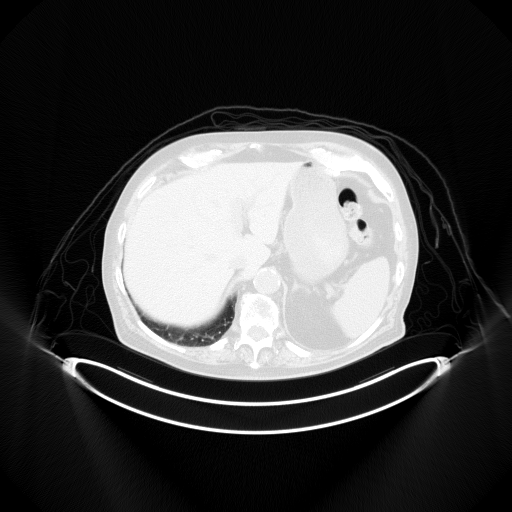
[im 182/243  brain]
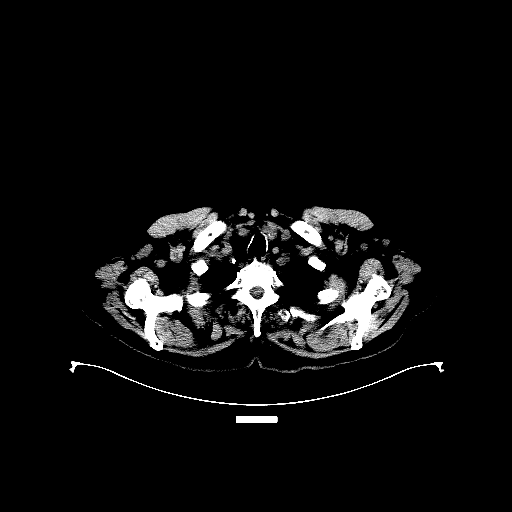
[im 243/243]
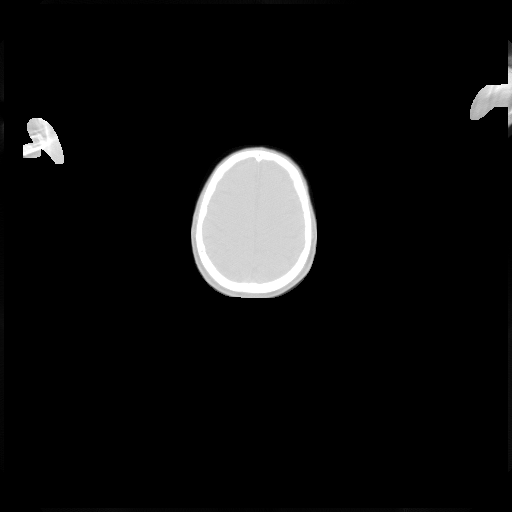

[Series 5: pet sk_thigh nac · axial · 5.0mm · 4.07mm/px · z∈[-949,+19]mm · 5 of 243 slices shown]
[im 1/243]
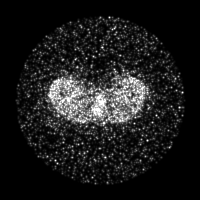
[im 61/243]
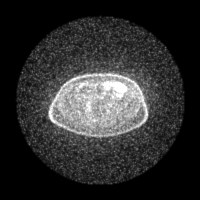
[im 122/243]
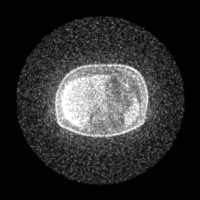
[im 182/243]
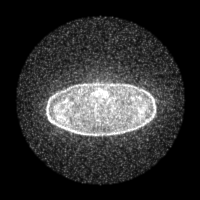
[im 243/243]
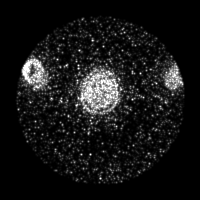

[Series 8: ct sk_thigh 5.0 br59 lung_bone · axial · 5.0mm · 0.98mm/px · z∈[-527,-191]mm · 2 of 85 slices shown]
[im 1/85]
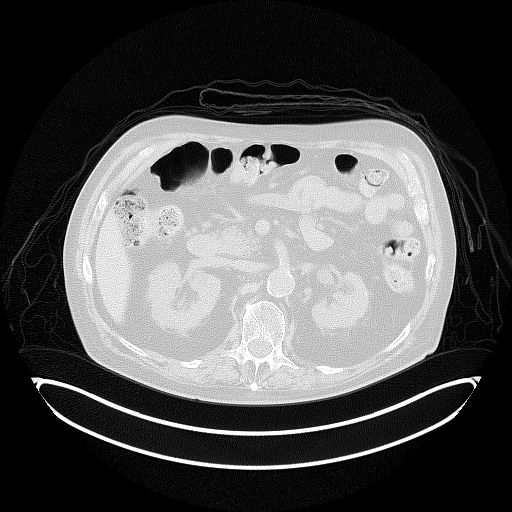
[im 85/85]
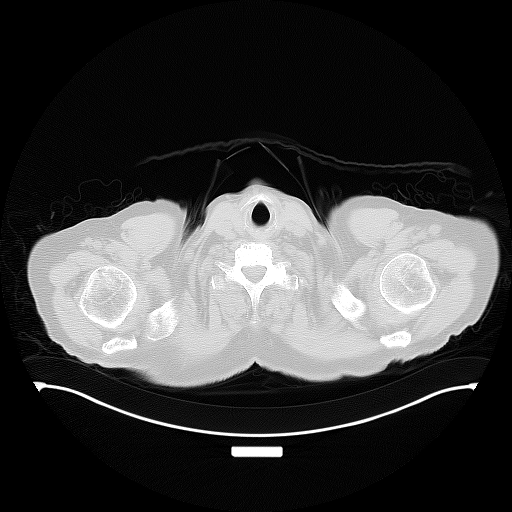

[Series 603: <mip collection> · coronal · 2.01mm/px · 1 of 32 slices shown]
[im 1/32]
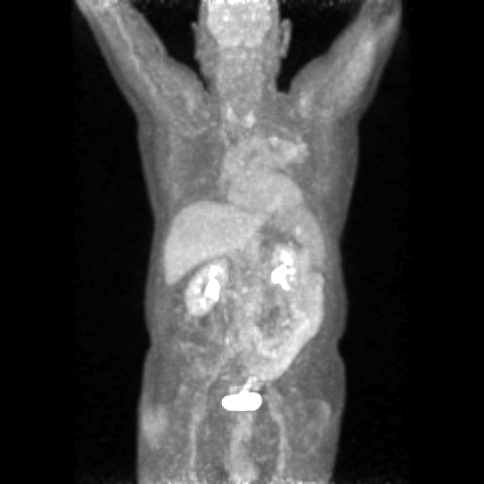

[Series 604: fused cor · 1 of 38 slices shown]
[im 1/38]
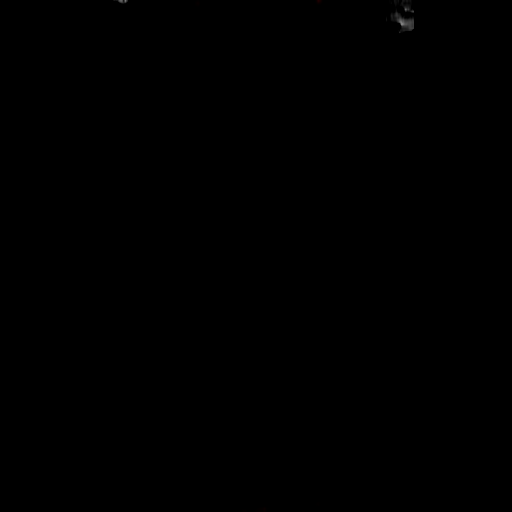

[Series 605: range-ct sk_thigh 5.0 hd_fov-tra-<alpha range> · 5 of 233 slices shown]
[im 1/233]
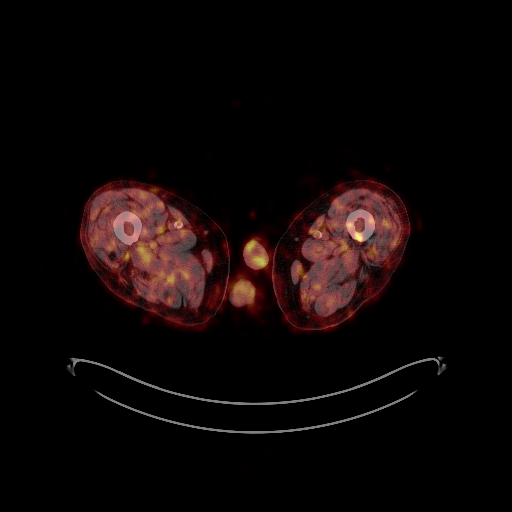
[im 59/233]
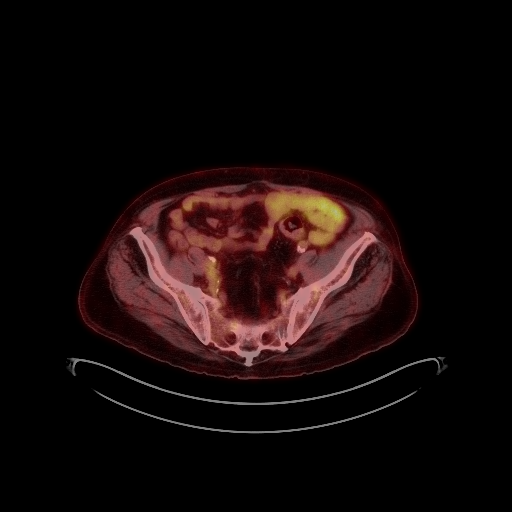
[im 117/233]
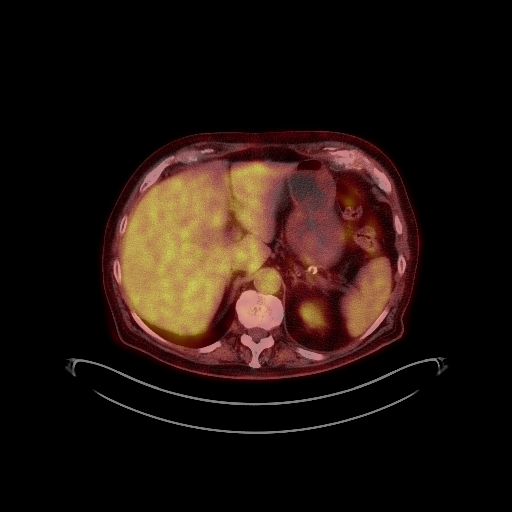
[im 175/233]
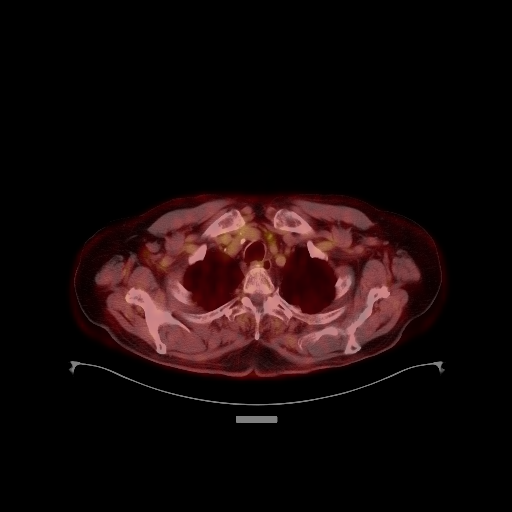
[im 233/233]
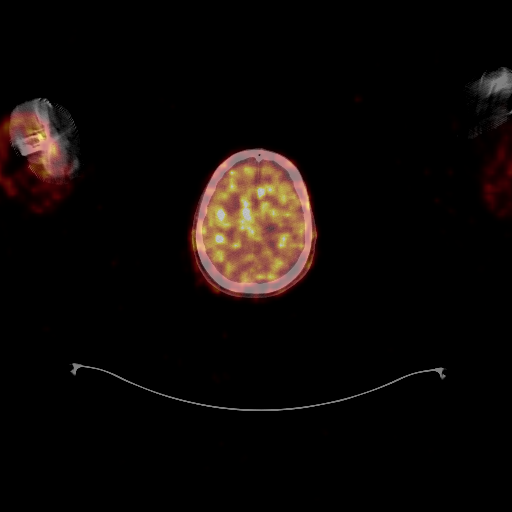

[25 of 25 positions shown; findings below may reference images not displayed]

FINDINGS: Mediastinal blood pool activity: SUV max

Liver activity: SUV max NA

NECK: No hypermetabolic lymph nodes in the neck.

Incidental CT findings: none

CHEST: Post radiation fibrosis in the left upper lobe and left lower
lobe shows low level FDG uptake with SUV max = 4.1. There is some
more focal uptake in the left suprahilar region, indeterminate.

New left lower lobe pulmonary nodule of concern on recent diagnostic
chest CT shows FDG accumulation with SUV max = 2.6.

Incidental CT findings: Coronary artery calcification is evident.
Atherosclerotic calcification noted in the thoracic aorta.

ABDOMEN/PELVIS: No abnormal hypermetabolic activity within the
liver, pancreas, adrenal glands, or spleen. No hypermetabolic lymph
nodes in the abdomen or pelvis.

Subtle ill-defined wispy focus of soft tissue attenuation lateral to
the stomach on image 114/4 shows hypermetabolism with SUV max = 4.2.

Incidental CT findings: Gallbladder surgically absent nonobstructing
stone noted lower pole left kidney. Abdominal aortic aneurysm
repair.

SKELETON: No focal hypermetabolic activity to suggest skeletal
metastasis.

Status post

Incidental CT findings: No worrisome lytic or sclerotic osseous
abnormality.
IMPRESSION: 1. Low level hypermetabolism again noted in the left lung post
treatment fibrosis.
2. New 10 x 15 mm left lower lobe pulmonary nodule on recent
diagnostic chest CT shows low level FDG accumulation. Given
discernible FDG accumulation in a tiny nodule of this size, findings
are concerning for neoplasm. Metastatic disease or metachronous
primary would both be considerations.
3. No evidence for hypermetabolic metastatic disease in the abdomen
or pelvis.
4.  Aortic Atherosclerois ([id number])

## 2023-08-07 IMAGING — CR DG CHEST 1V PORT
1 series · 1 of 1 positions shown · non-contrast
Comparison: AP chest 07/13/2021, CT chest 11/19/2021, PET-CT
12/10/2021

CLINICAL DATA: Status post bronchoscopy

EXAM:
PORTABLE CHEST 1 VIEW

[AP]
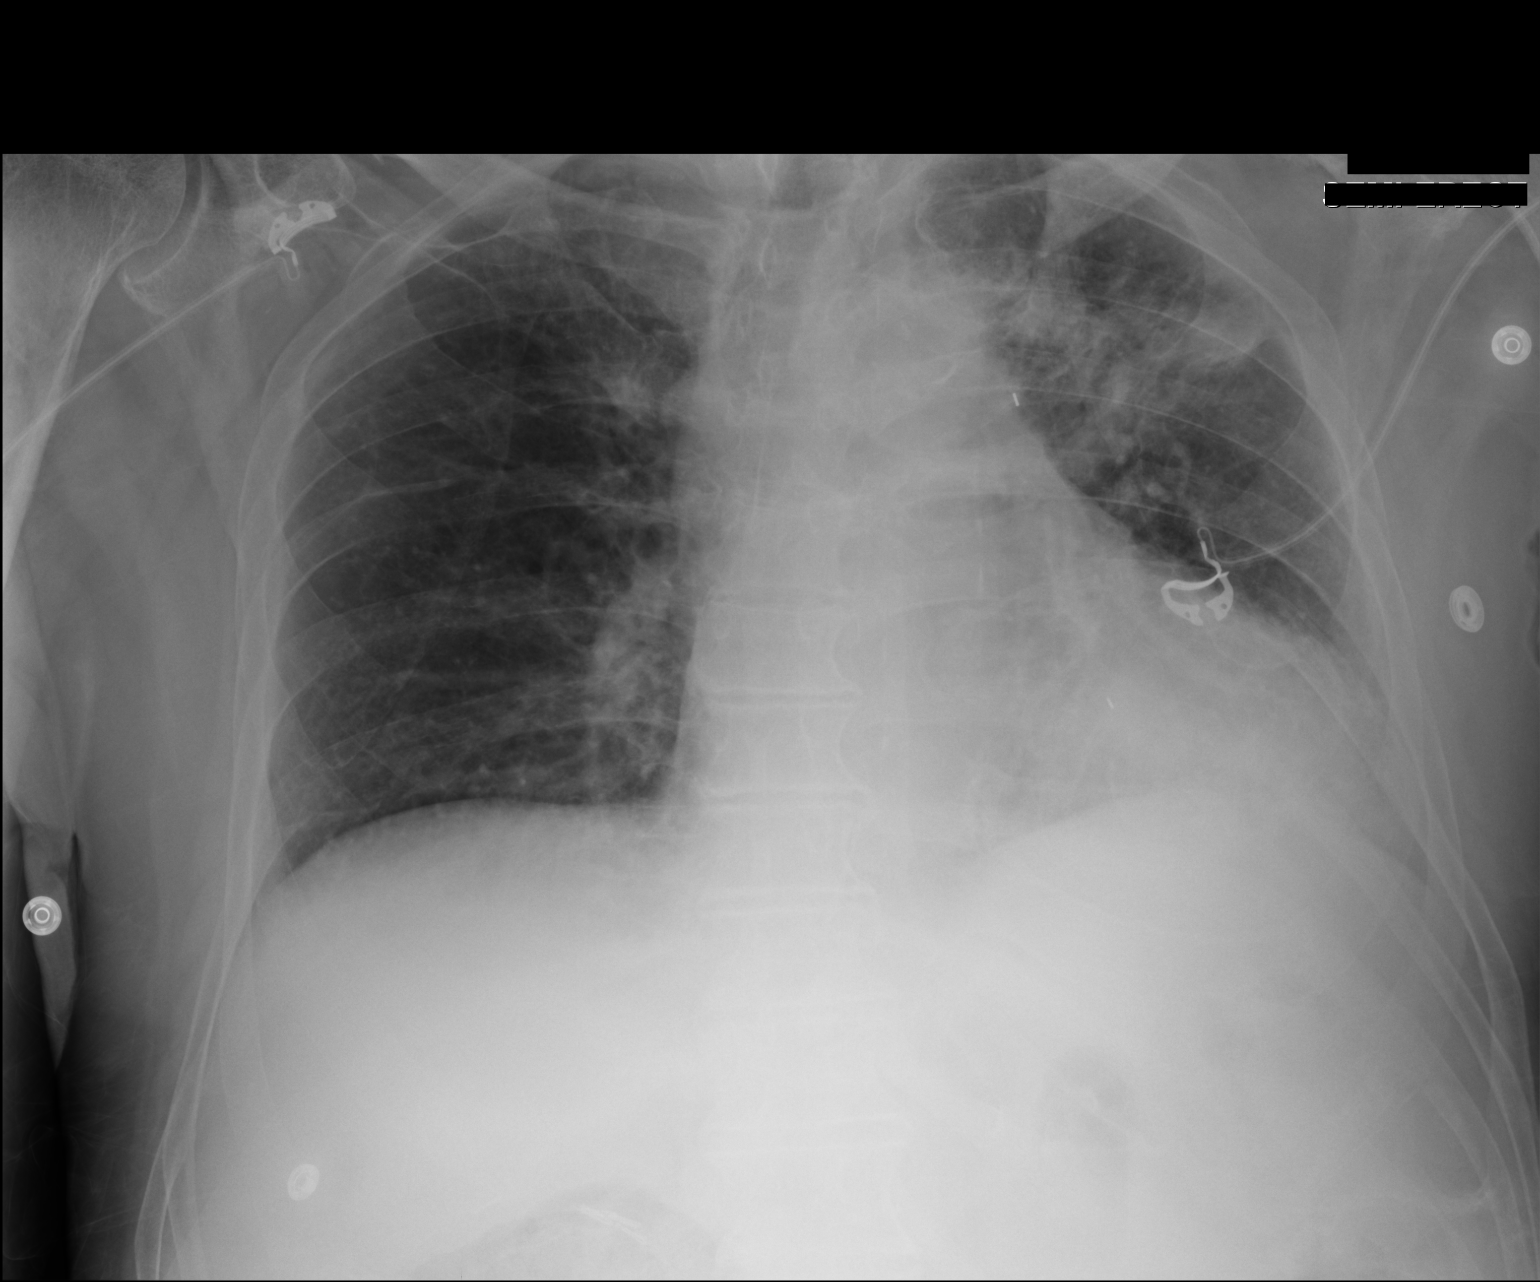

[1 of 1 positions shown; findings below may reference images not displayed]

FINDINGS: There is moderate to high-grade heterogeneous and linear
opacification within the left upper lung increased from 07/13/2021
frontal radiograph and likely very similar to 12/10/2021 PET-CT
given differences in modality. This is consistent with the
postradiation changes seen on prior PET-CT including treatment of a
superomedial left upper lung mass (within the superomedial aspect of
the superior segment of the left lower lobe) seen on 07/05/2021 CT
and markedly decreased in size on 12/10/2021 PET-CT. There is also
mild left retrocardiac heterogeneous airspace opacification likely
corresponding to interstitial thickening on 12/10/2021 CT. The right
lung is grossly clear.
IMPRESSION: :
IMPRESSION: 1. Heterogeneous and linear opacities within the left upper lung,
likely similar to 12/10/2021 PET-CT that demonstrated postradiation
changes and decrease in size of a mass within the medial aspect of
the superior segment of the left lower lobe.
2. There is mild heterogeneous opacification over the left lower
lung on frontal view, possibly corresponding to postradiation
changes. Note is made of a concerning spiculated nodule within the
left lower lobe on 11/19/2021 CT measuring up to 1.5 cm.

## 2023-11-01 IMAGING — CT CT MAXILLOFACIAL W/O CM
3 series · 15 of 47 positions shown, 18 images · non-contrast
Comparison: MRI head 03/01/2022

CLINICAL DATA: Fall.  Head injury.



[Series 3: max soft · axial · 0.36mm/px · z∈[+1346,+1498]mm · 9 of 90 slices shown, 12 images]
[im 7/90  brain]
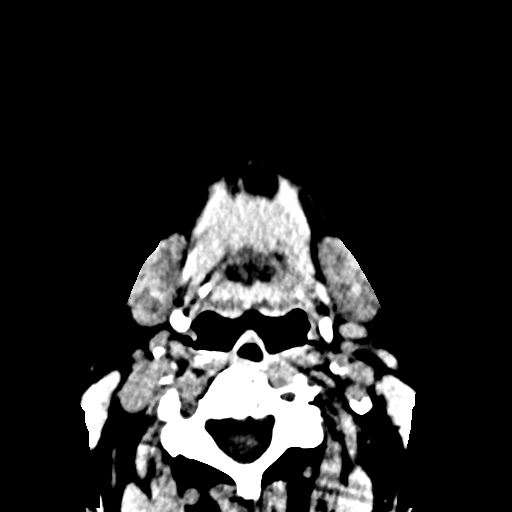
[im 7/90  bone]
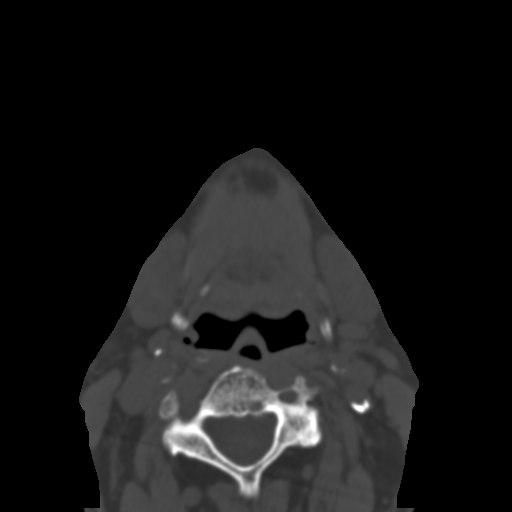
[im 16/90  bone]
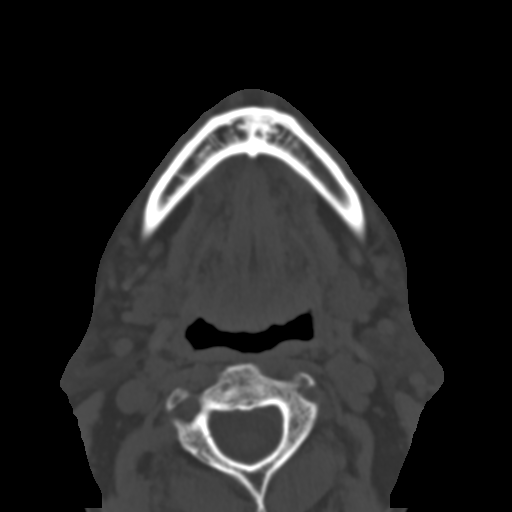
[im 25/90  bone]
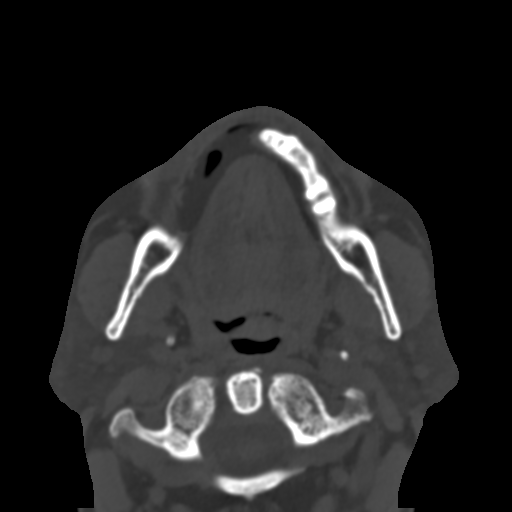
[im 34/90  bone]
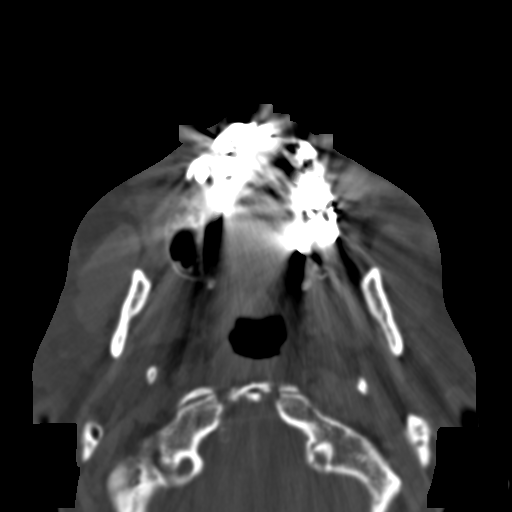
[im 47/90  brain]
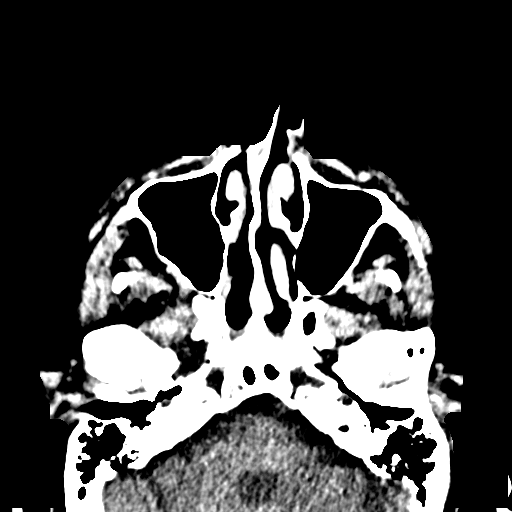
[im 47/90  bone]
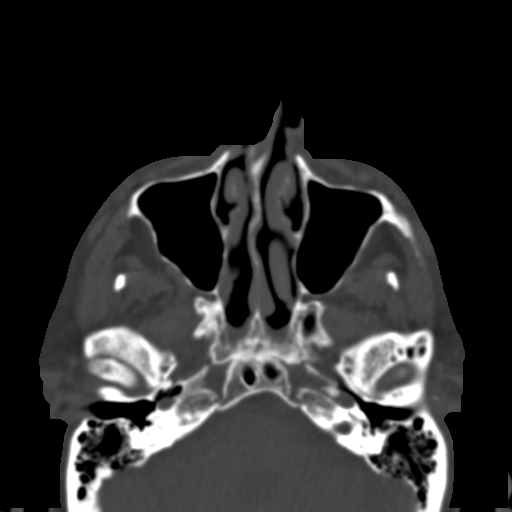
[im 56/90  bone]
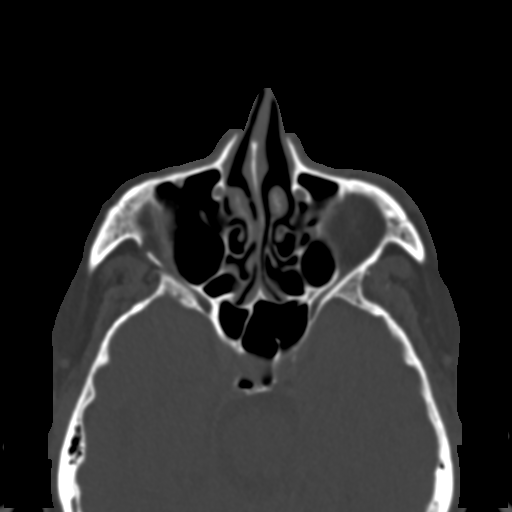
[im 65/90  bone]
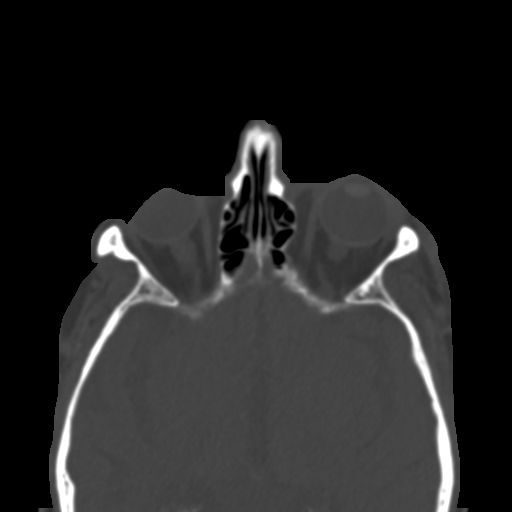
[im 74/90  bone]
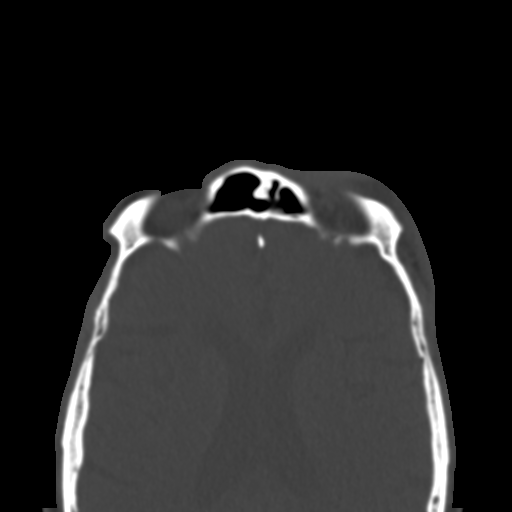
[im 83/90  brain]
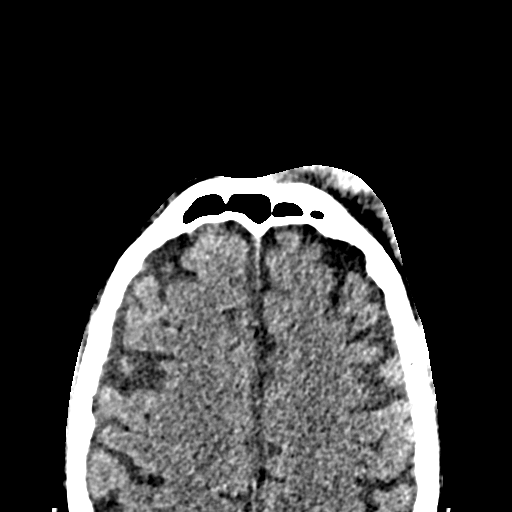
[im 83/90  bone]
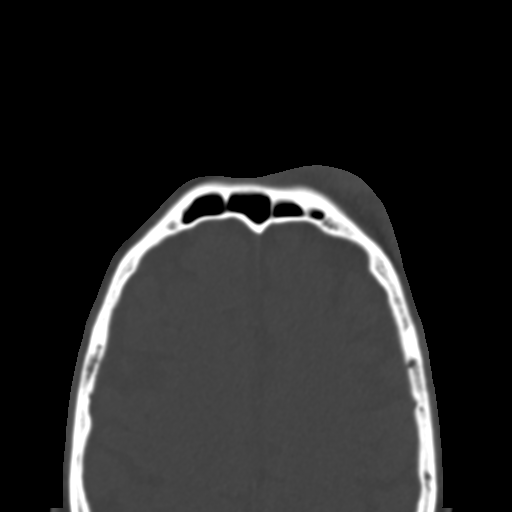

[Series 7: coronal soft · coronal · 0.40mm/px · 3 of 85 slices shown]
[im 29/85  bone]
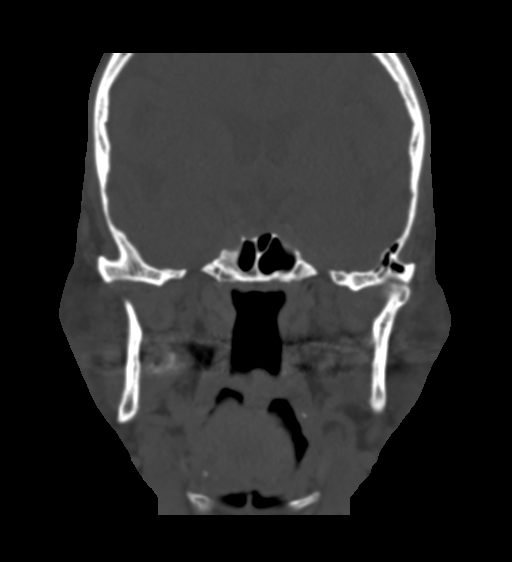
[im 38/85  bone]
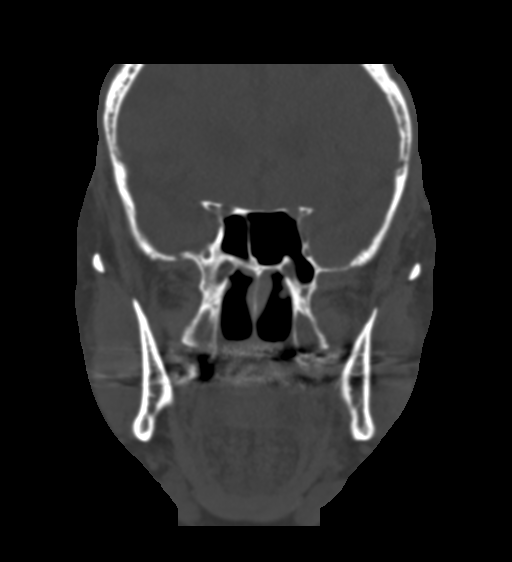
[im 47/85  bone]
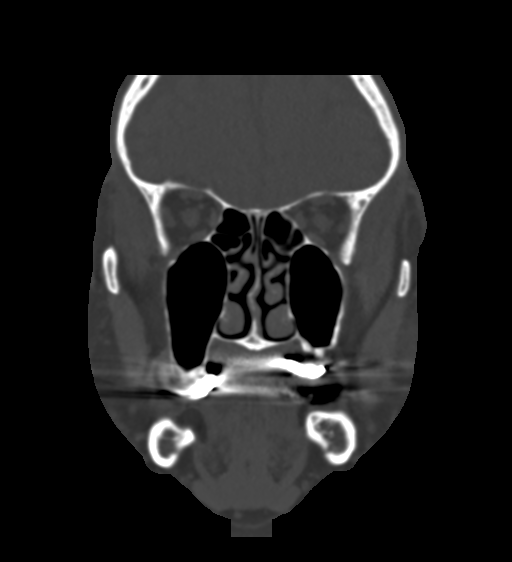

[Series 8: sagittal soft · sagittal · 0.39mm/px · 3 of 94 slices shown]
[im 32/94  bone]
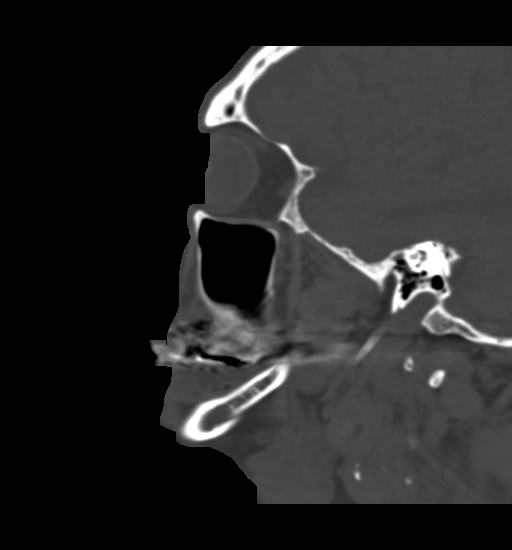
[im 47/94  bone]
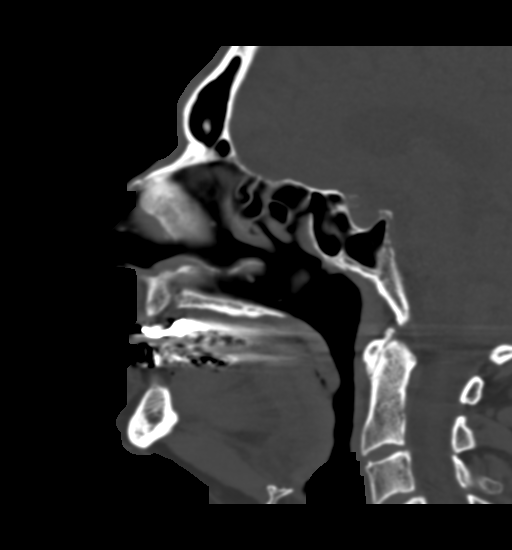
[im 63/94  bone]
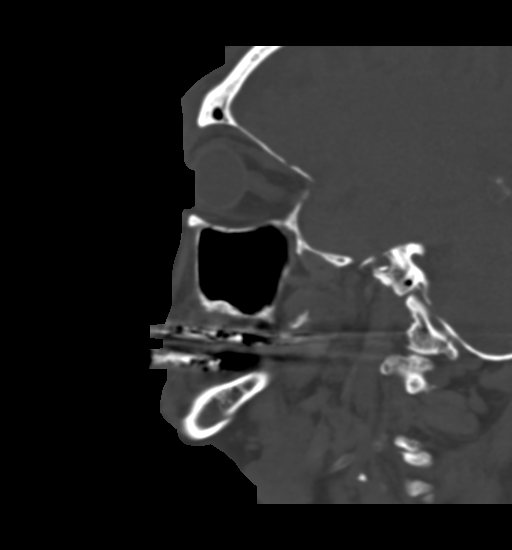

[15 of 47 positions shown; findings below may reference images not displayed]

FINDINGS: CT HEAD FINDINGS

Brain: Moderate atrophy. Moderate chronic microvascular ischemic
change in the white matter.

Negative for acute infarct, intracranial hemorrhage, or mass lesion.

Vascular: Negative for hyperdense vessel

Skull: Negative for skull fracture. Moderately large left frontal
scalp hematoma

Other: None

CT MAXILLOFACIAL FINDINGS

Osseous: Negative for facial fracture

Orbits: No orbital mass.

Sinuses: Clear

Soft tissues: Moderately large left frontal scalp hematoma above the
left orbit.

CT CERVICAL SPINE FINDINGS

Alignment: Mild anterolisthesis C3-4 and C4-5. Mild retrolisthesis
C5-6

Skull base and vertebrae: Negative for cervical spine fracture

Soft tissues and spinal canal: Negative

Disc levels: Disc and facet degeneration throughout the cervical
spine. Left foraminal narrowing C3-4 and C4-5 due to spurring.
Bilateral foraminal narrowing C5-6 and C6-7.

Upper chest: Lung apices clear bilaterally

Other: None
IMPRESSION: 1. Atrophy and chronic microvascular ischemic change in the white
matter. No acute intracranial abnormality
2. Negative for facial fracture. Moderately large left frontal
supraorbital scalp hematoma
3. Moderate cervical spondylosis. Negative for cervical spine
fracture.

## 2023-11-01 IMAGING — CT CT HEAD W/O CM
3 of 4 series · 13 of 47 positions shown, 15 images · non-contrast
Comparison: MRI head 03/01/2022

CLINICAL DATA: Fall.  Head injury.



[Series 3: head wo · axial · 0.49mm/px · z∈[+1424,+1544]mm · 7 of 34 slices shown, 9 images]
[im 5/34  brain]
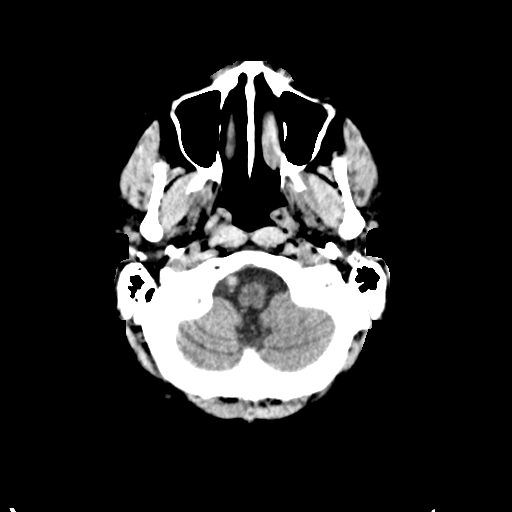
[im 5/34  bone]
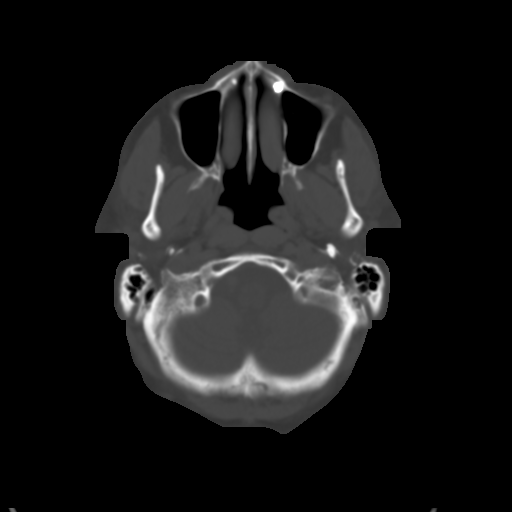
[im 9/34  brain]
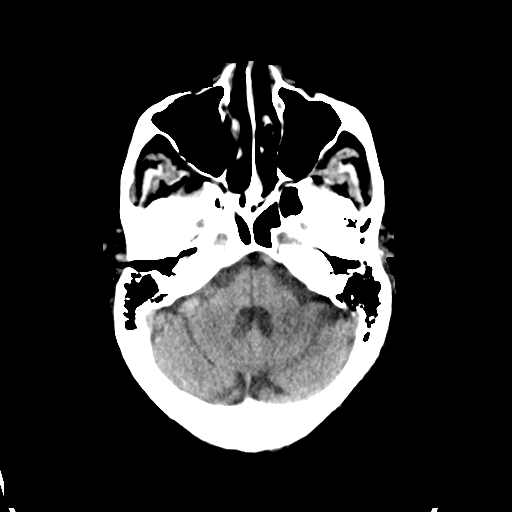
[im 13/34  brain]
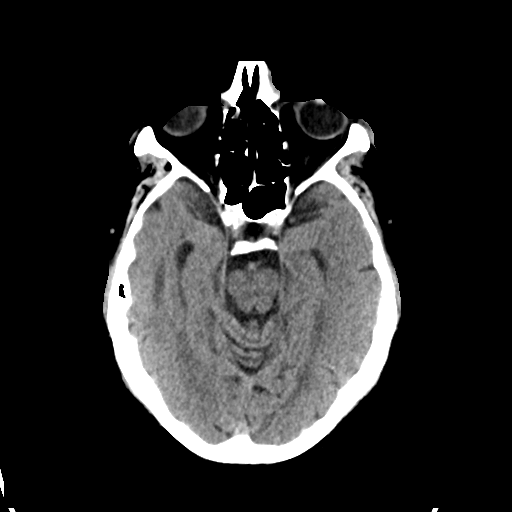
[im 17/34  brain]
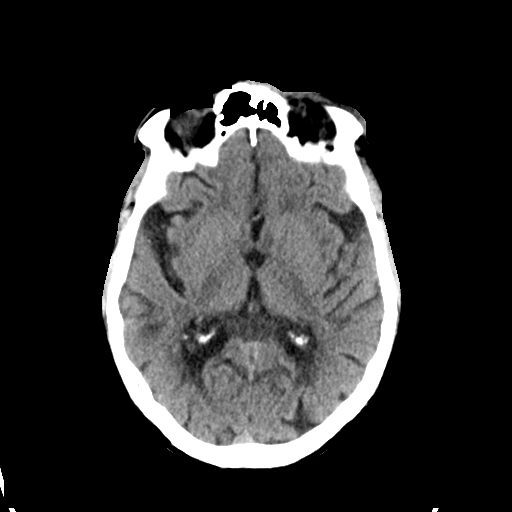
[im 21/34  brain]
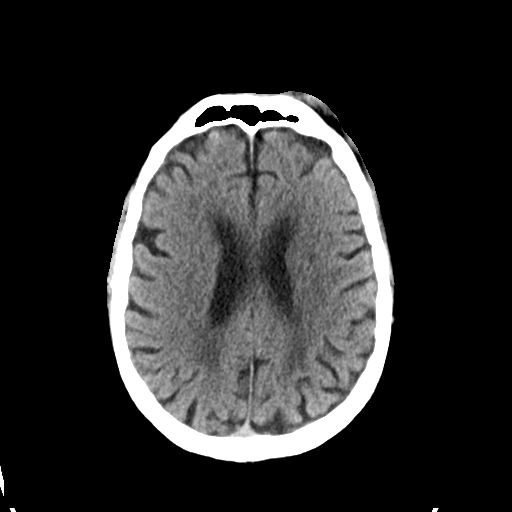
[im 21/34  bone]
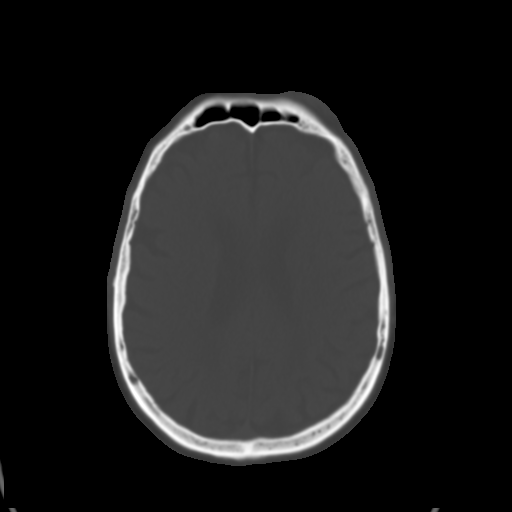
[im 25/34  brain]
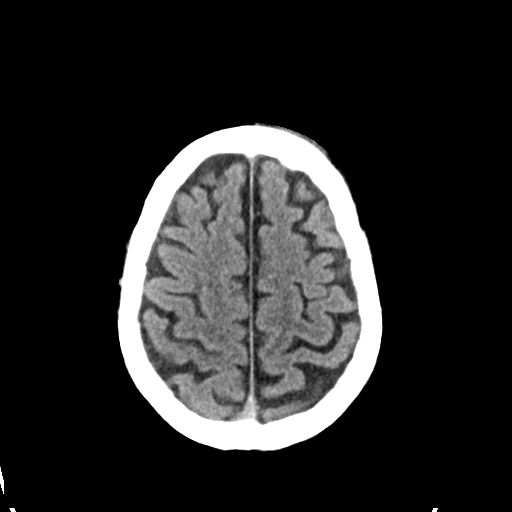
[im 29/34  brain]
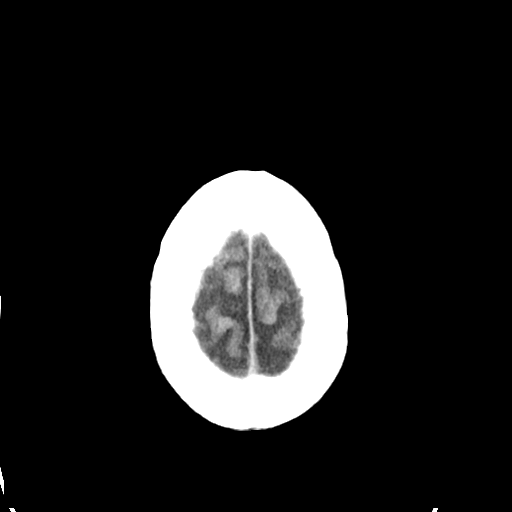

[Series 5: coronal soft tissue · coronal · 0.36mm/px · 3 of 71 slices shown]
[im 24/71  brain]
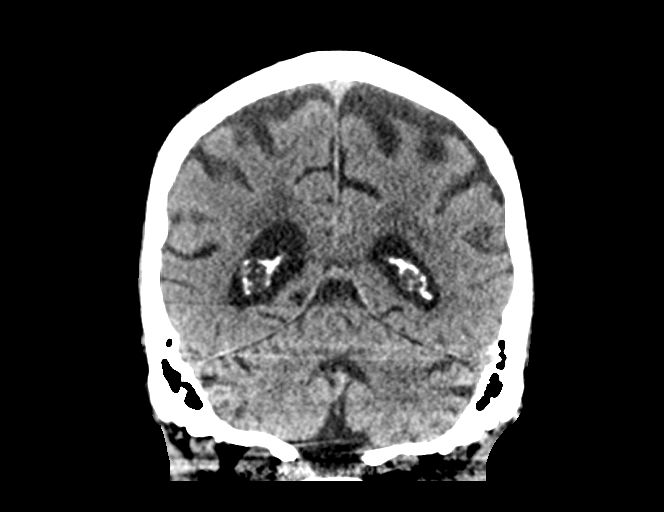
[im 32/71  brain]
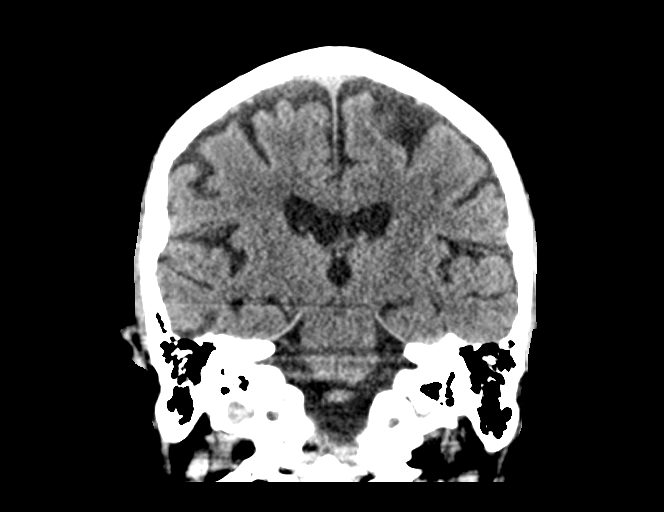
[im 39/71  brain]
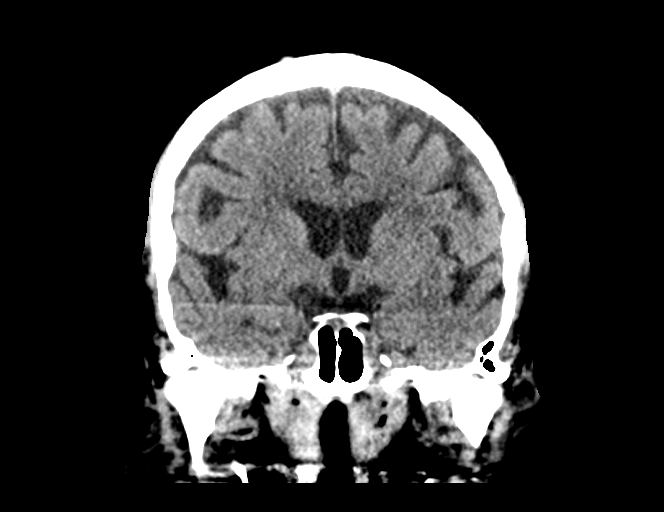

[Series 6: sagittal soft tissue · sagittal · 0.36mm/px · 3 of 53 slices shown]
[im 18/53  brain]
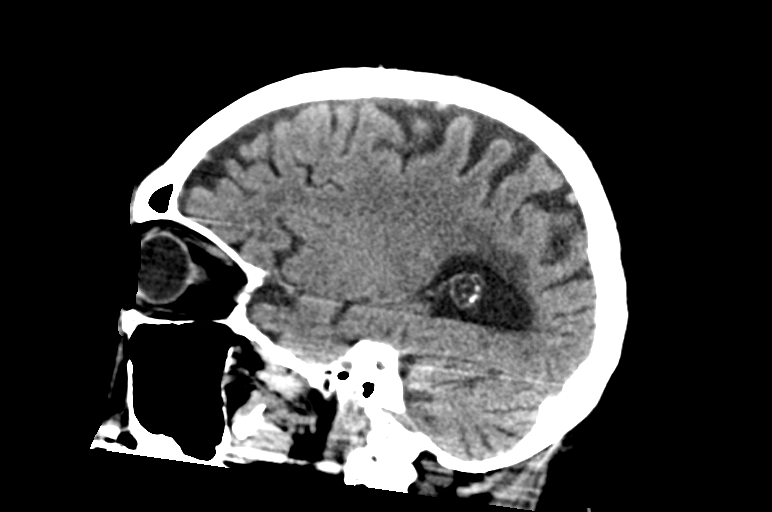
[im 27/53  brain]
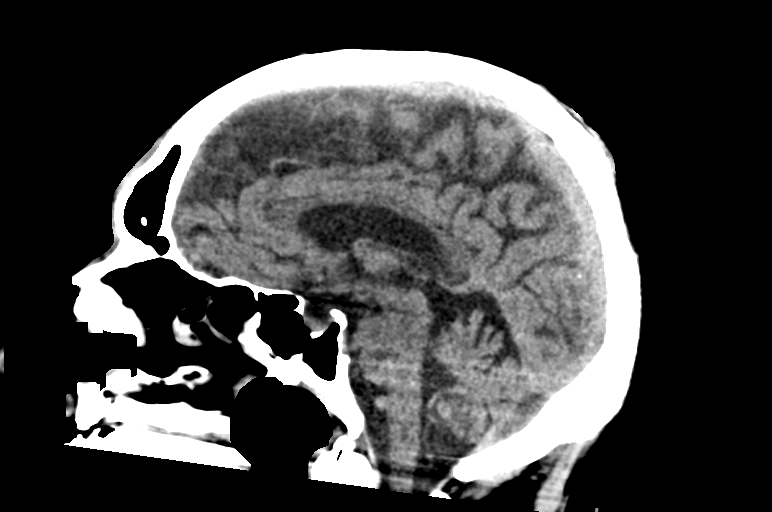
[im 35/53  brain]
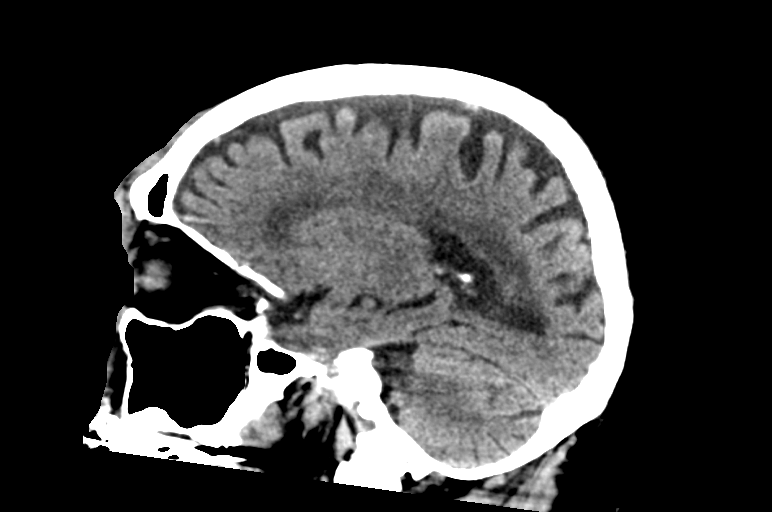

[13 of 47 positions shown; findings below may reference images not displayed]

FINDINGS: CT HEAD FINDINGS

Brain: Moderate atrophy. Moderate chronic microvascular ischemic
change in the white matter.

Negative for acute infarct, intracranial hemorrhage, or mass lesion.

Vascular: Negative for hyperdense vessel

Skull: Negative for skull fracture. Moderately large left frontal
scalp hematoma

Other: None

CT MAXILLOFACIAL FINDINGS

Osseous: Negative for facial fracture

Orbits: No orbital mass.

Sinuses: Clear

Soft tissues: Moderately large left frontal scalp hematoma above the
left orbit.

CT CERVICAL SPINE FINDINGS

Alignment: Mild anterolisthesis C3-4 and C4-5. Mild retrolisthesis
C5-6

Skull base and vertebrae: Negative for cervical spine fracture

Soft tissues and spinal canal: Negative

Disc levels: Disc and facet degeneration throughout the cervical
spine. Left foraminal narrowing C3-4 and C4-5 due to spurring.
Bilateral foraminal narrowing C5-6 and C6-7.

Upper chest: Lung apices clear bilaterally

Other: None
IMPRESSION: 1. Atrophy and chronic microvascular ischemic change in the white
matter. No acute intracranial abnormality
2. Negative for facial fracture. Moderately large left frontal
supraorbital scalp hematoma
3. Moderate cervical spondylosis. Negative for cervical spine
fracture.

## 2023-11-20 IMAGING — MR MR HEAD WO/W CM
15 series · 48 of 48 positions shown · IV contrast (gadavist)
Comparison: Head CT March 27, 2022; MRI of the brain July 25, 2021

CLINICAL DATA: Metastatic cancer to brain (HCC) A8P.AR ([id number]).

EXAM:
MRI HEAD WITHOUT AND WITH CONTRAST
TECHNIQUE: Multiplanar, multiecho pulse sequences of the brain and surrounding
structures were obtained without and with intravenous contrast.
CONTRAST:  8mL GADAVIST GADOBUTROL 1 MMOL/ML IV SOLN

[Series 5: DWI · axial · 3.0mm · 1.36mm/px · z∈[-25,+113]mm · 4 of 96 slices shown (1 of 2)]
[im 1/96]
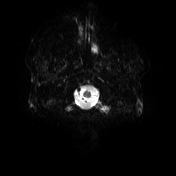
[im 32/96]
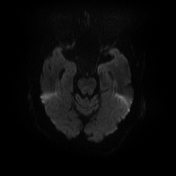
[im 64/96]
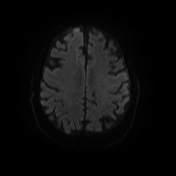
[im 96/96]
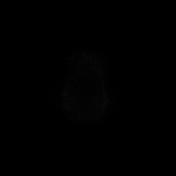

[Series 6: DWI · axial · 3.0mm · 1.36mm/px · z∈[-25,+113]mm · 3 of 48 slices shown (2 of 2)]
[im 1/48]
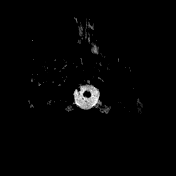
[im 24/48]
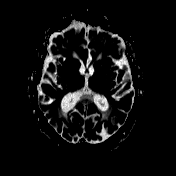
[im 48/48]
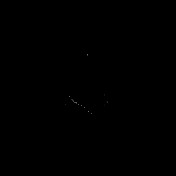

[Series 7: T1 · sagittal · 5.0mm · 0.75mm/px · 1 of 24 slices shown (1 of 4)]
[im 1/24]
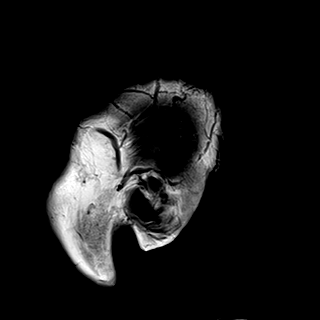

[Series 8: T2 · axial · 5.0mm · 0.62mm/px · 1 of 26 slices shown]
[im 1/26]
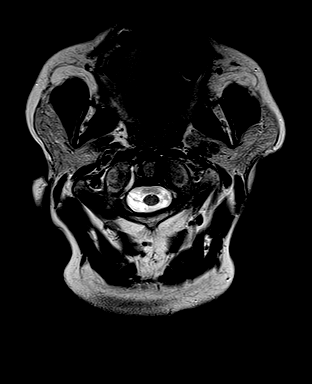

[Series 9: swi_images · axial · 3.0mm · 0.75mm/px · z∈[-77,+134]mm · 4 of 72 slices shown]
[im 1/72]
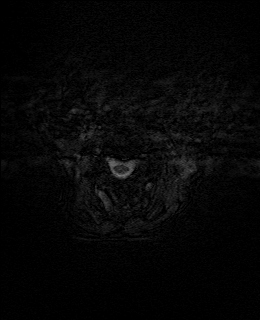
[im 24/72]
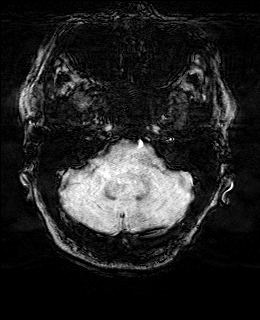
[im 48/72]
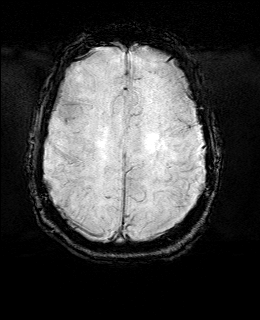
[im 72/72]
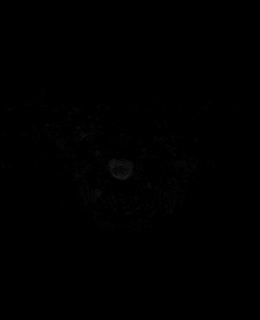

[Series 11: FLAIR · axial · 3.0mm · 0.75mm/px · z∈[-47,+104]mm · 3 of 52 slices shown]
[im 1/52]
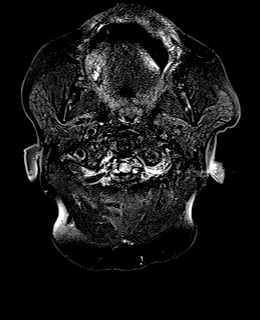
[im 26/52]
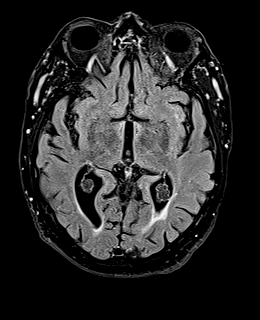
[im 52/52]
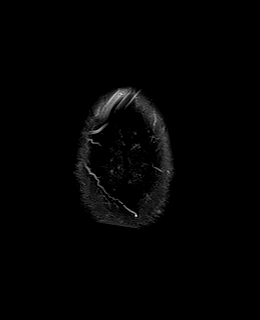

[Series 12: T1 · axial · 1.0mm · 0.94mm/px · z∈[-47,+110]mm · 9 of 160 slices shown (2 of 4)]
[im 1/160]
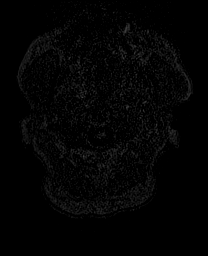
[im 20/160]
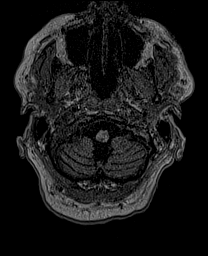
[im 40/160]
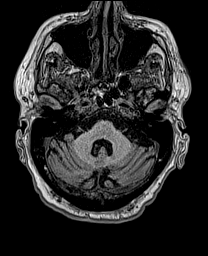
[im 60/160]
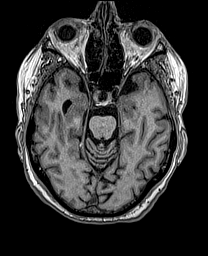
[im 80/160]
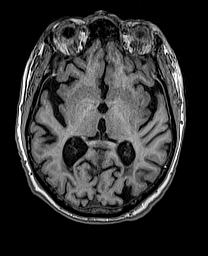
[im 100/160]
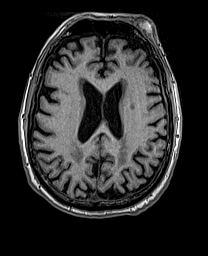
[im 120/160]
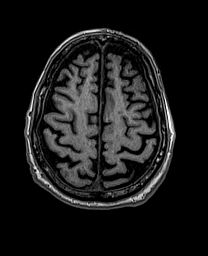
[im 140/160]
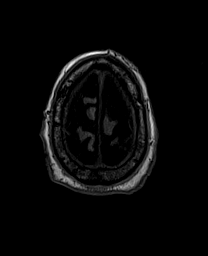
[im 160/160]
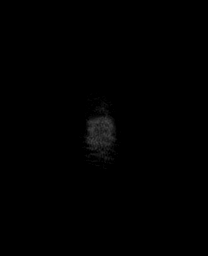

[Series 13: cor dwi_tracew · coronal · 5.0mm · 1.53mm/px · 3 of 60 slices shown]
[im 1/60]
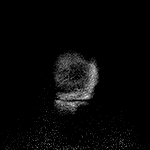
[im 30/60]
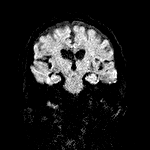
[im 60/60]
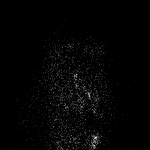

[Series 14: cor dwi_adc · coronal · 5.0mm · 1.53mm/px · 2 of 29 slices shown]
[im 1/29]
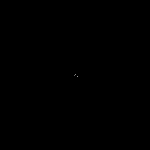
[im 29/29]
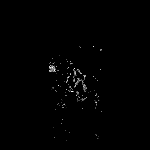

[Series 15: T2 post-contrast · coronal · 5.0mm · 0.57mm/px · 2 of 30 slices shown]
[im 1/30]
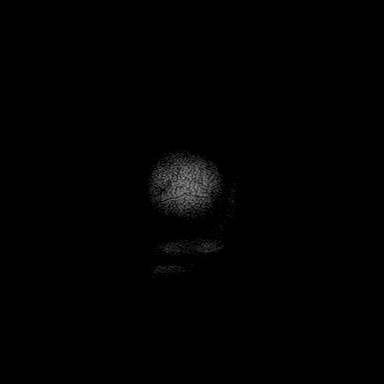
[im 30/30]
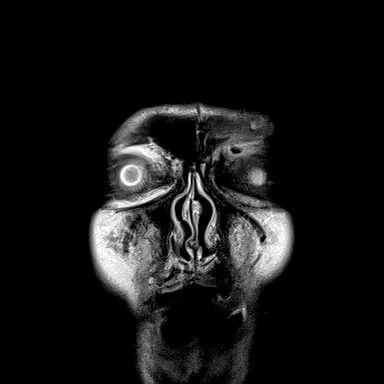

[Series 16: T1 post-contrast · axial · 1.0mm · 0.94mm/px · z∈[-47,+110]mm · 9 of 160 slices shown (1 of 3)]
[im 1/160]
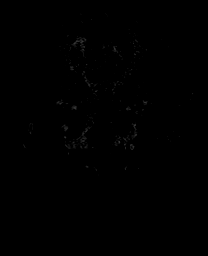
[im 20/160]
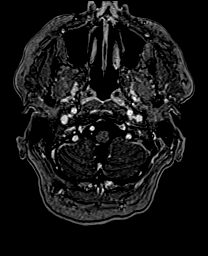
[im 40/160]
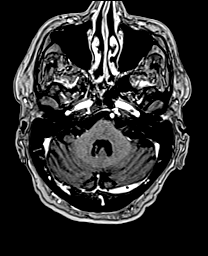
[im 60/160]
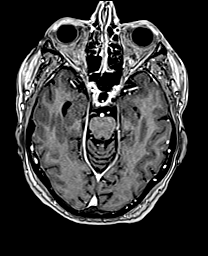
[im 80/160]
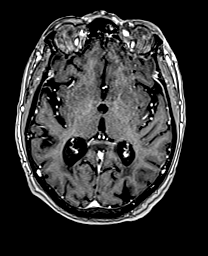
[im 100/160]
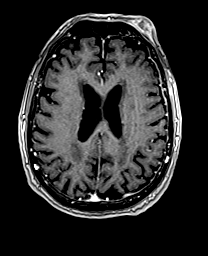
[im 120/160]
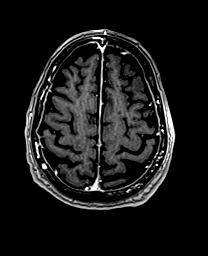
[im 140/160]
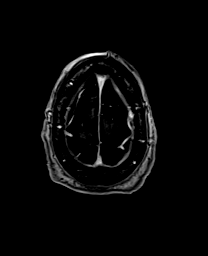
[im 160/160]
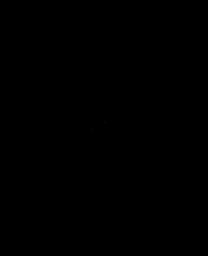

[Series 17: T1 · sagittal · 4.0mm · 0.94mm/px · 2 of 30 slices shown (3 of 4)]
[im 1/30]
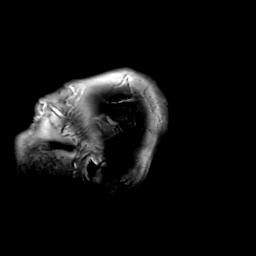
[im 30/30]
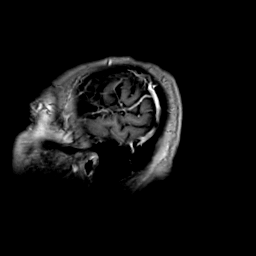

[Series 18: T1 · coronal · 4.0mm · 0.94mm/px · 2 of 30 slices shown (4 of 4)]
[im 1/30]
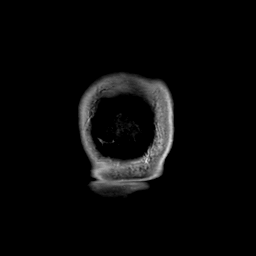
[im 30/30]
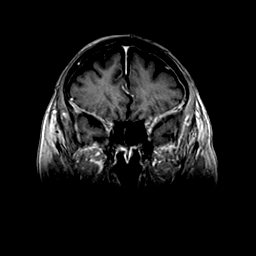

[Series 19: T1 post-contrast · coronal · 5.0mm · 0.43mm/px · 2 of 30 slices shown (2 of 3)]
[im 1/30]
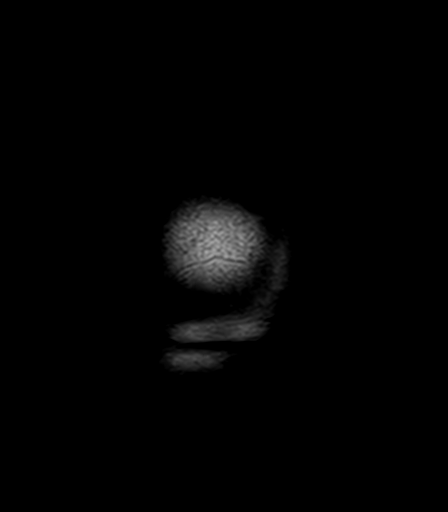
[im 30/30]
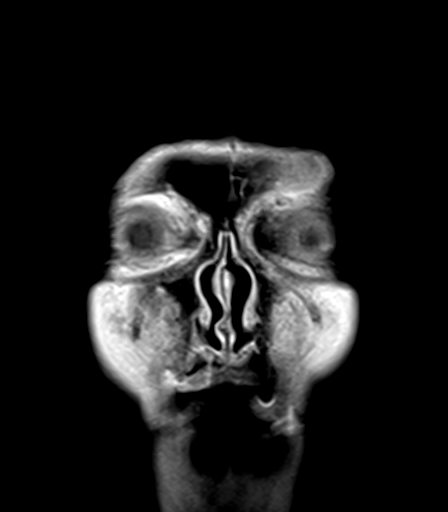

[Series 20: T1 post-contrast · sagittal · 5.0mm · 0.75mm/px · 1 of 24 slices shown (3 of 3)]
[im 1/24]
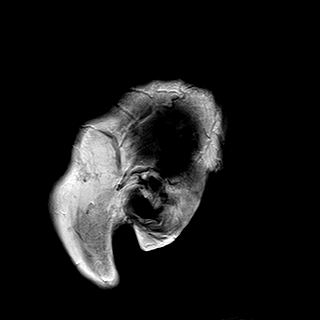

[48 of 48 positions shown; findings below may reference images not displayed]

FINDINGS: Brain: No acute infarction, hemorrhage, hydrocephalus, extra-axial
collection or mass lesion. Small areas of encephalomalacia and
gliosis in the inferior right temporal lobe and anterior left
temporal lobe, likely posttraumatic. Cortical susceptibility
artifact in the right parietal and left frontal region suggested of
hemosiderin deposit.

Scattered and confluent foci of T2 hyperintensity are seen within
the white matter of the cerebral hemispheres, nonspecific, most
likely related to chronic small vessel ischemia. No focus of
abnormal contrast enhancement to suggest intracranial metastatic
disease.

Vascular: Normal flow voids.

Skull and upper cervical spine: Normal marrow signal.

Sinuses/Orbits: Negative.

Other: None.
IMPRESSION: 1. No evidence of abnormal contrast enhancement to suggest
intracranial metastatic disease.
2. Moderate chronic microvascular ischemic changes of the white
matter.
3. Unchanged posttraumatic findings.
